# Patient Record
Sex: Male | Born: 1950 | Race: Black or African American | Hispanic: No | Marital: Married | Smoking: Former smoker
Health system: Southern US, Community
[De-identification: ages and names within clinical notes are randomized; demographics above are authoritative.]

## PROBLEM LIST (undated history)

## (undated) DIAGNOSIS — E785 Hyperlipidemia, unspecified: Secondary | ICD-10-CM

## (undated) DIAGNOSIS — I2510 Atherosclerotic heart disease of native coronary artery without angina pectoris: Secondary | ICD-10-CM

## (undated) DIAGNOSIS — Z9289 Personal history of other medical treatment: Secondary | ICD-10-CM

## (undated) DIAGNOSIS — F0150 Vascular dementia without behavioral disturbance: Secondary | ICD-10-CM

## (undated) DIAGNOSIS — I739 Peripheral vascular disease, unspecified: Secondary | ICD-10-CM

## (undated) DIAGNOSIS — M869 Osteomyelitis, unspecified: Secondary | ICD-10-CM

## (undated) DIAGNOSIS — I10 Essential (primary) hypertension: Secondary | ICD-10-CM

## (undated) DIAGNOSIS — I639 Cerebral infarction, unspecified: Secondary | ICD-10-CM

## (undated) DIAGNOSIS — N186 End stage renal disease: Secondary | ICD-10-CM

## (undated) DIAGNOSIS — I509 Heart failure, unspecified: Secondary | ICD-10-CM

## (undated) DIAGNOSIS — E119 Type 2 diabetes mellitus without complications: Secondary | ICD-10-CM

## (undated) DIAGNOSIS — N289 Disorder of kidney and ureter, unspecified: Secondary | ICD-10-CM

## (undated) HISTORY — PX: NEPHRECTOMY TRANSPLANTED ORGAN: SUR880

## (undated) HISTORY — DX: Peripheral vascular disease, unspecified: I73.9

## (undated) HISTORY — DX: Atherosclerotic heart disease of native coronary artery without angina pectoris: I25.10

## (undated) HISTORY — PX: BACK SURGERY: SHX140

## (undated) HISTORY — DX: End stage renal disease: N18.6

## (undated) HISTORY — DX: Type 2 diabetes mellitus without complications: E11.9

## (undated) HISTORY — PX: LEG AMPUTATION BELOW KNEE: SHX694

## (undated) HISTORY — DX: Hyperlipidemia, unspecified: E78.5

## (undated) HISTORY — PX: HAND SURGERY: SHX662

## (undated) HISTORY — DX: Vascular dementia, unspecified severity, without behavioral disturbance, psychotic disturbance, mood disturbance, and anxiety: F01.50

## (undated) HISTORY — PX: KIDNEY TRANSPLANT: SHX239

## (undated) HISTORY — DX: Osteomyelitis, unspecified: M86.9

## (undated) HISTORY — PX: OTHER SURGICAL HISTORY: SHX169

## (undated) HISTORY — DX: Heart failure, unspecified: I50.9

---

## 2013-08-08 DIAGNOSIS — N289 Disorder of kidney and ureter, unspecified: Secondary | ICD-10-CM

## 2013-08-08 DIAGNOSIS — Z940 Kidney transplant status: Secondary | ICD-10-CM

## 2013-08-08 HISTORY — DX: Disorder of kidney and ureter, unspecified: N28.9

## 2013-08-08 HISTORY — DX: Kidney transplant status: Z94.0

## 2014-06-06 DIAGNOSIS — D849 Immunodeficiency, unspecified: Secondary | ICD-10-CM | POA: Insufficient documentation

## 2014-06-06 DIAGNOSIS — D899 Disorder involving the immune mechanism, unspecified: Secondary | ICD-10-CM

## 2014-06-09 DIAGNOSIS — Z792 Long term (current) use of antibiotics: Secondary | ICD-10-CM | POA: Insufficient documentation

## 2014-06-10 DIAGNOSIS — T83122A Displacement of urinary stent, initial encounter: Secondary | ICD-10-CM | POA: Insufficient documentation

## 2014-06-16 DIAGNOSIS — Z9189 Other specified personal risk factors, not elsewhere classified: Secondary | ICD-10-CM | POA: Insufficient documentation

## 2015-06-16 DIAGNOSIS — B349 Viral infection, unspecified: Secondary | ICD-10-CM

## 2015-06-16 DIAGNOSIS — B348 Other viral infections of unspecified site: Secondary | ICD-10-CM | POA: Insufficient documentation

## 2016-08-15 ENCOUNTER — Ambulatory Visit (INDEPENDENT_AMBULATORY_CARE_PROVIDER_SITE_OTHER): Payer: Medicare Other | Admitting: Podiatry

## 2016-08-15 ENCOUNTER — Encounter: Payer: Self-pay | Admitting: Podiatry

## 2016-08-15 ENCOUNTER — Ambulatory Visit (INDEPENDENT_AMBULATORY_CARE_PROVIDER_SITE_OTHER): Payer: Medicare Other

## 2016-08-15 VITALS — BP 158/84 | HR 79 | Resp 16 | Ht 71.0 in | Wt 219.0 lb

## 2016-08-15 DIAGNOSIS — E1140 Type 2 diabetes mellitus with diabetic neuropathy, unspecified: Secondary | ICD-10-CM | POA: Diagnosis not present

## 2016-08-15 DIAGNOSIS — E1149 Type 2 diabetes mellitus with other diabetic neurological complication: Secondary | ICD-10-CM

## 2016-08-15 DIAGNOSIS — M79672 Pain in left foot: Secondary | ICD-10-CM

## 2016-08-15 DIAGNOSIS — E1151 Type 2 diabetes mellitus with diabetic peripheral angiopathy without gangrene: Secondary | ICD-10-CM | POA: Diagnosis not present

## 2016-08-15 DIAGNOSIS — L97401 Non-pressure chronic ulcer of unspecified heel and midfoot limited to breakdown of skin: Secondary | ICD-10-CM | POA: Diagnosis not present

## 2016-08-15 NOTE — Progress Notes (Signed)
   Subjective:    Patient ID: Jonathan Akram Sr., male    DOB: [birth year], 66 y.o.   MRN: [id number]  HPI Chief Complaint  Patient presents with  . Wound Check    Left foot; plantar forefoot-below great toe; pt stated, "Saw some blood come out of ulcer yesterday"; x1+ yrs; pt Diabetic Type 2; Sugar=92 this am; A1C=Did not know      Review of Systems  Constitutional: Positive for fatigue and unexpected weight change.  Cardiovascular: Positive for leg swelling.  Psychiatric/Behavioral: The patient is nervous/anxious.   All other systems reviewed and are negative.      Objective:   Physical Exam        Assessment & Plan:

## 2016-08-15 NOTE — Progress Notes (Signed)
Subjective:     Patient ID: Jonathan Hank Sr., male   DOB: [birth year], 66 y.o.   MRN: [id number]  HPI patient presents with son stating that he's had problems with underneath his left first metatarsal for around a year and a half and he's not had out right infection but it's been broken down and it time drains. Has an approximate 30 year history of diabetes and is had kidney transplant   Review of Systems  All other systems reviewed and are negative.      Objective:   Physical Exam  Constitutional: He is oriented to person, place, and time.  Cardiovascular: Intact distal pulses.   Musculoskeletal: Normal range of motion.  Neurological: He is oriented to person, place, and time.  Skin: Skin is warm and dry.  Nursing note and vitals reviewed.  neurovascular status diminished both sharp [employer] vibratory and DP PT pulses with patient's son stating that he gets his circulation checked. He is found under the left first metatarsal to have a keratotic lesion that's chronic in its nature with some breakdown of tissue localized in nature with no proximal edema erythema or drainage noted at this time. Patient's found to have good digital perfusion and is well oriented 3     Assessment:     Lesion that is secondary to the probable position of the first metatarsal with chronic low grade irritation of skin left and superficial slough of tissue    Plan:     H&P and x-rays of situation discussed. At this point using sharp sterile limitation debridement accomplished I applied padding and instructed on Iodosorb usage at this time. Patient was given strict instructions of any further redness drainage or other issues should occur to call immediately and that daily inspections need to be done. I then want to make this patient diabetic shoes and we will get approval due to the chronic nature of his condition  X-ray left negative for lysis or other bone pathology

## 2016-09-01 DIAGNOSIS — E785 Hyperlipidemia, unspecified: Secondary | ICD-10-CM | POA: Insufficient documentation

## 2016-09-01 DIAGNOSIS — Z7901 Long term (current) use of anticoagulants: Secondary | ICD-10-CM | POA: Insufficient documentation

## 2016-09-01 DIAGNOSIS — E1139 Type 2 diabetes mellitus with other diabetic ophthalmic complication: Secondary | ICD-10-CM | POA: Insufficient documentation

## 2016-09-11 DIAGNOSIS — Z48298 Encounter for aftercare following other organ transplant: Secondary | ICD-10-CM | POA: Insufficient documentation

## 2016-09-12 DIAGNOSIS — I240 Acute coronary thrombosis not resulting in myocardial infarction: Secondary | ICD-10-CM | POA: Insufficient documentation

## 2016-09-12 DIAGNOSIS — Z8679 Personal history of other diseases of the circulatory system: Secondary | ICD-10-CM | POA: Insufficient documentation

## 2016-09-12 DIAGNOSIS — I513 Intracardiac thrombosis, not elsewhere classified: Secondary | ICD-10-CM | POA: Insufficient documentation

## 2016-09-30 ENCOUNTER — Ambulatory Visit: Payer: Medicare Other | Admitting: Podiatry

## 2017-01-12 ENCOUNTER — Encounter: Payer: Self-pay | Admitting: Podiatry

## 2017-01-12 ENCOUNTER — Ambulatory Visit (INDEPENDENT_AMBULATORY_CARE_PROVIDER_SITE_OTHER): Payer: Medicare Other | Admitting: Podiatry

## 2017-01-12 DIAGNOSIS — L97401 Non-pressure chronic ulcer of unspecified heel and midfoot limited to breakdown of skin: Secondary | ICD-10-CM

## 2017-01-12 DIAGNOSIS — M79604 Pain in right leg: Secondary | ICD-10-CM

## 2017-01-12 DIAGNOSIS — M79676 Pain in unspecified toe(s): Secondary | ICD-10-CM | POA: Diagnosis not present

## 2017-01-12 DIAGNOSIS — B351 Tinea unguium: Secondary | ICD-10-CM | POA: Diagnosis not present

## 2017-01-12 DIAGNOSIS — E1140 Type 2 diabetes mellitus with diabetic neuropathy, unspecified: Secondary | ICD-10-CM | POA: Diagnosis not present

## 2017-01-12 DIAGNOSIS — M79605 Pain in left leg: Secondary | ICD-10-CM

## 2017-01-12 DIAGNOSIS — E1149 Type 2 diabetes mellitus with other diabetic neurological complication: Secondary | ICD-10-CM

## 2017-01-12 NOTE — Progress Notes (Signed)
Subjective:    Patient ID: Jonathan Alexandria Sr., male   DOB: 66 y.o.   MRN: [id number]   HPI patient states that he still has this breakdown of tissue plantar aspect left foot and nail disease 1-5 both feet that he cannot take care of. 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 Discussed long-term diabetic shoes and we will get permission and approval from family physician for these to be made for this particular patient

## 2017-02-27 ENCOUNTER — Telehealth: Payer: Self-pay | Admitting: Podiatry

## 2017-02-27 NOTE — Telephone Encounter (Signed)
Left message for pt to call to schedule an appt with Liliane Channel to PUDS

## 2017-03-10 DIAGNOSIS — F0150 Vascular dementia without behavioral disturbance: Secondary | ICD-10-CM | POA: Insufficient documentation

## 2017-03-10 NOTE — Telephone Encounter (Signed)
lvm for pt to call to schedule an appt to pick up diabetic shoes

## 2017-03-16 ENCOUNTER — Ambulatory Visit (INDEPENDENT_AMBULATORY_CARE_PROVIDER_SITE_OTHER): Payer: Medicare Other | Admitting: Orthotics

## 2017-03-16 DIAGNOSIS — E1151 Type 2 diabetes mellitus with diabetic peripheral angiopathy without gangrene: Secondary | ICD-10-CM

## 2017-03-16 DIAGNOSIS — E1149 Type 2 diabetes mellitus with other diabetic neurological complication: Secondary | ICD-10-CM | POA: Diagnosis not present

## 2017-03-16 DIAGNOSIS — L97401 Non-pressure chronic ulcer of unspecified heel and midfoot limited to breakdown of skin: Secondary | ICD-10-CM

## 2017-03-16 DIAGNOSIS — E1140 Type 2 diabetes mellitus with diabetic neuropathy, unspecified: Secondary | ICD-10-CM

## 2017-03-16 NOTE — Progress Notes (Signed)

## 2017-04-14 ENCOUNTER — Ambulatory Visit: Payer: Medicare Other | Admitting: Podiatry

## 2017-06-26 ENCOUNTER — Encounter: Payer: Self-pay | Admitting: Podiatry

## 2017-06-26 ENCOUNTER — Ambulatory Visit (INDEPENDENT_AMBULATORY_CARE_PROVIDER_SITE_OTHER): Payer: Medicare Other | Admitting: Podiatry

## 2017-06-26 DIAGNOSIS — L84 Corns and callosities: Secondary | ICD-10-CM | POA: Diagnosis not present

## 2017-06-26 DIAGNOSIS — M79674 Pain in right toe(s): Secondary | ICD-10-CM | POA: Diagnosis not present

## 2017-06-26 DIAGNOSIS — E1151 Type 2 diabetes mellitus with diabetic peripheral angiopathy without gangrene: Secondary | ICD-10-CM

## 2017-06-26 DIAGNOSIS — B351 Tinea unguium: Secondary | ICD-10-CM

## 2017-06-26 DIAGNOSIS — M79675 Pain in left toe(s): Secondary | ICD-10-CM | POA: Diagnosis not present

## 2017-06-26 DIAGNOSIS — D689 Coagulation defect, unspecified: Secondary | ICD-10-CM

## 2017-06-26 NOTE — Progress Notes (Signed)
Subjective:    Patient ID: Jonathan Alexandria Sr., male   DOB: 66 y.o.   MRN: [id number]   HPI patient presents with chronic nail disease 1-5 of both feet with thick yellow brittle nailbeds and keratotic lesion sub-first metatarsal left. Patient is a diabetic who is under reasonably good control but has neurological issues and does take blood      ROS      Objective:  Physical Exam neurovascular status unchanged with thick yellow brittle nailbeds 1-5 both feet with lesion sub-first metatarsal left painful when pressed     Assessment:    At risk diabetic who is on blood thinner with mycotic nail infection lesion formation     Plan:    Debridement and nailbeds and lesion with no iatrogenic bleeding and reappoint for same type of procedure

## 2017-09-25 ENCOUNTER — Ambulatory Visit (INDEPENDENT_AMBULATORY_CARE_PROVIDER_SITE_OTHER): Payer: Medicare Other | Admitting: Podiatry

## 2017-09-25 DIAGNOSIS — B351 Tinea unguium: Secondary | ICD-10-CM

## 2017-09-25 DIAGNOSIS — E1149 Type 2 diabetes mellitus with other diabetic neurological complication: Secondary | ICD-10-CM

## 2017-09-25 DIAGNOSIS — I219 Acute myocardial infarction, unspecified: Secondary | ICD-10-CM | POA: Insufficient documentation

## 2017-09-25 DIAGNOSIS — M79605 Pain in left leg: Secondary | ICD-10-CM

## 2017-09-25 DIAGNOSIS — E1140 Type 2 diabetes mellitus with diabetic neuropathy, unspecified: Secondary | ICD-10-CM | POA: Diagnosis not present

## 2017-09-25 DIAGNOSIS — M79675 Pain in left toe(s): Secondary | ICD-10-CM

## 2017-09-25 DIAGNOSIS — L84 Corns and callosities: Secondary | ICD-10-CM | POA: Diagnosis not present

## 2017-09-25 DIAGNOSIS — B182 Chronic viral hepatitis C: Secondary | ICD-10-CM | POA: Insufficient documentation

## 2017-09-25 DIAGNOSIS — Z9289 Personal history of other medical treatment: Secondary | ICD-10-CM | POA: Insufficient documentation

## 2017-09-25 DIAGNOSIS — D689 Coagulation defect, unspecified: Secondary | ICD-10-CM

## 2017-09-25 DIAGNOSIS — Z940 Kidney transplant status: Secondary | ICD-10-CM | POA: Insufficient documentation

## 2017-09-25 DIAGNOSIS — M79604 Pain in right leg: Secondary | ICD-10-CM

## 2017-09-25 DIAGNOSIS — E119 Type 2 diabetes mellitus without complications: Secondary | ICD-10-CM | POA: Insufficient documentation

## 2017-09-25 DIAGNOSIS — M79674 Pain in right toe(s): Secondary | ICD-10-CM

## 2017-09-25 DIAGNOSIS — E1151 Type 2 diabetes mellitus with diabetic peripheral angiopathy without gangrene: Secondary | ICD-10-CM

## 2017-09-25 NOTE — Progress Notes (Signed)
Subjective:   Patient ID: Jonathan Alexandria Sr., male   DOB: 67 y.o.   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  Discussed long-term diabetic shoes which she has worn in the past to keep the pressure off the first metatarsal and prevent breakdown of tissue.  We did fill out forms and sent for authorization

## 2017-10-23 ENCOUNTER — Telehealth: Payer: Self-pay | Admitting: Podiatry

## 2017-10-23 NOTE — Telephone Encounter (Signed)
lvm for pt to call to schedule an appt to be measured for diabetic shoes.

## 2017-11-13 ENCOUNTER — Telehealth: Payer: Self-pay | Admitting: Podiatry

## 2017-11-13 NOTE — Telephone Encounter (Signed)
Left message for pt to call to schedule an appt to be measured for diabetic shoes.

## 2017-11-28 DIAGNOSIS — I252 Old myocardial infarction: Secondary | ICD-10-CM | POA: Insufficient documentation

## 2017-11-28 DIAGNOSIS — I10 Essential (primary) hypertension: Secondary | ICD-10-CM | POA: Insufficient documentation

## 2017-11-28 DIAGNOSIS — T8611 Kidney transplant rejection: Secondary | ICD-10-CM | POA: Insufficient documentation

## 2017-11-29 DIAGNOSIS — I639 Cerebral infarction, unspecified: Secondary | ICD-10-CM | POA: Insufficient documentation

## 2017-12-02 DIAGNOSIS — Z7901 Long term (current) use of anticoagulants: Secondary | ICD-10-CM | POA: Insufficient documentation

## 2017-12-14 DIAGNOSIS — D62 Acute posthemorrhagic anemia: Secondary | ICD-10-CM | POA: Insufficient documentation

## 2017-12-21 DIAGNOSIS — D509 Iron deficiency anemia, unspecified: Secondary | ICD-10-CM | POA: Insufficient documentation

## 2017-12-25 ENCOUNTER — Other Ambulatory Visit: Payer: Medicare Other

## 2018-01-10 DIAGNOSIS — Z992 Dependence on renal dialysis: Secondary | ICD-10-CM | POA: Insufficient documentation

## 2018-01-10 DIAGNOSIS — R269 Unspecified abnormalities of gait and mobility: Secondary | ICD-10-CM | POA: Insufficient documentation

## 2018-01-16 DIAGNOSIS — N2581 Secondary hyperparathyroidism of renal origin: Secondary | ICD-10-CM | POA: Insufficient documentation

## 2018-01-16 DIAGNOSIS — L299 Pruritus, unspecified: Secondary | ICD-10-CM | POA: Insufficient documentation

## 2018-01-16 DIAGNOSIS — R0602 Shortness of breath: Secondary | ICD-10-CM | POA: Insufficient documentation

## 2018-01-16 DIAGNOSIS — R52 Pain, unspecified: Secondary | ICD-10-CM | POA: Insufficient documentation

## 2018-01-16 DIAGNOSIS — T829XXA Unspecified complication of cardiac and vascular prosthetic device, implant and graft, initial encounter: Secondary | ICD-10-CM | POA: Insufficient documentation

## 2018-01-16 DIAGNOSIS — D688 Other specified coagulation defects: Secondary | ICD-10-CM | POA: Insufficient documentation

## 2018-01-16 DIAGNOSIS — E1151 Type 2 diabetes mellitus with diabetic peripheral angiopathy without gangrene: Secondary | ICD-10-CM | POA: Insufficient documentation

## 2018-01-24 DIAGNOSIS — M1000 Idiopathic gout, unspecified site: Secondary | ICD-10-CM | POA: Insufficient documentation

## 2018-02-13 ENCOUNTER — Ambulatory Visit: Payer: Medicare Other | Attending: Registered Nurse | Admitting: Physical Therapy

## 2018-02-13 ENCOUNTER — Encounter: Payer: Self-pay | Admitting: Physical Therapy

## 2018-02-13 ENCOUNTER — Ambulatory Visit: Payer: Medicare Other | Admitting: Occupational Therapy

## 2018-02-13 DIAGNOSIS — R278 Other lack of coordination: Secondary | ICD-10-CM

## 2018-02-13 DIAGNOSIS — I69315 Cognitive social or emotional deficit following cerebral infarction: Secondary | ICD-10-CM

## 2018-02-13 DIAGNOSIS — M6281 Muscle weakness (generalized): Secondary | ICD-10-CM | POA: Diagnosis present

## 2018-02-13 DIAGNOSIS — R2689 Other abnormalities of gait and mobility: Secondary | ICD-10-CM

## 2018-02-13 DIAGNOSIS — R2681 Unsteadiness on feet: Secondary | ICD-10-CM

## 2018-02-13 NOTE — Therapy (Signed)
[facility] [address] [place], [place], [address] Phone: [phone]   Fax:  [phone]  Occupational Therapy Evaluation  Patient Details  Name: Jonathan Viglione Sr. MRN: [id number] Date of Birth: 1950-09-29 Referring Provider: Reather Laurence. Blanch Media   Encounter Date: 02/13/2018  OT End of Session - 02/13/18 1234    Visit Number  1    Number of Visits  25    Date for OT Re-Evaluation  05/14/18    Authorization Type  [employer] -# [id number]   Authorization Time Period  await auth    OT Start Time  1020    OT Stop Time  1100    OT Time Calculation (min)  40 min    Behavior During Therapy  [facility] for tasks assessed/performed       No past medical history on file.  Past Surgical History:  Procedure Laterality Date  . BACK SURGERY     Has had 2 back surgeries  . Depression    . HAND SURGERY Right   . KIDNEY TRANSPLANT     November 2015  . Memory loss      There were no vitals filed for this visit.  Subjective Assessment - 02/13/18 1543    Subjective   Pt reports difficulty using L hand    Pertinent History  2015-kidney transplant; 2017-ministrokes; port of R side of chest; fistula on L upper arm (No BP in LUE); ESRD, CVA, Hep C per hx    Patient Stated Goals  regain use of LUE    Currently in Pain?  No/denies        [facility] OT Assessment - 02/13/18 1028      Assessment   Medical Diagnosis  ESRD, cerebral infarction, DM, gait and imbalance    Referring Provider  [place] D. [place]    Onset Date/Surgical Date  01/10/18      Precautions   Precautions  Fall    Precaution Comments  Port R side chest, fistula L UE (NO BP); on dialysis M, W, F no showering allowed currently    Required Braces or Orthoses  -- dialysis M, W, F      Balance Screen   Has the patient fallen in the past 6 months  Yes    How many times?  1    Has the patient had a decrease in activity level because of a fear of falling?   Yes    Is  the patient reluctant to leave their home because of a fear of falling?   No      Home  Environment   Family/patient expects to be discharged to:  Private residence    Home Access  -- 1 step    [place]  Two level    Bathroom Shower/Tub  Tub/Shower unit    Lives With  Spouse      Prior Function   Level of [place]  Retired    Leisure  Enjoyed cutting grass (hasn't done in a while); senior center exercise class, Aquatic center classes      ADL   Eating/Feeding  Independent    Grooming  Minimal assistance assist for shaving    Upper Body Bathing  Maximal assistance    Lower Body Bathing  Maximal assistance    Upper Body Dressing  Minimal assistance difficulty with buttons    Lower Body Dressing  Moderate assistance    Toilet Transfer  Supervision/safety  Tub/Shower Transfer  -- n/a currently sponge bathing      IADL   Shopping  Completely unable to shop    Light Housekeeping  Does not participate in any housekeeping tasks    Meal Prep  Needs to have meals prepared and served    Medication Management  Is not capable of dispensing or managing own medication    Financial Management  Dependent      Mobility   Mobility Status  -- supervision-modified independent with rollator      Written Expression   Dominant Hand  Right      Vision - History   Baseline Vision  Wears glasses all the time      Vision Assessment   Vision Assessment  Vision not tested      Cognition   Overall Cognitive Status  Impaired/Different from baseline    Area of Impairment  Memory;Orientation;Safety/judgement confusion    Memory  Decreased short-term memory    Safety/Judgement  Decreased awareness of safety    Cognition Comments  Cognition to be further assessed in a functional context, pt was not oriented to day, date or year      Observation/Other Assessments   Focus on Therapeutic Outcomes (FOTO)   NA      Sensation   Light Touch  Impaired by gross assessment  impaired for LUE      Coordination   Gross Motor Movements are Fluid and Coordinated  No    Fine Motor Movements are Fluid and Coordinated  No    9 Hole Peg Test  Right;Left    Right 9 Hole Peg Test  45.59 secs    Left 9 Hole Peg Test  1 min 51 secs      ROM / Strength   AROM / PROM / Strength  AROM      AROM   Overall AROM   Deficits Pt does not consistently use LUE as a non dominant assist    Overall AROM Comments  RUE grossly WFLS, LUE shoulder flexion 125, shoulder abduction 100, supination 75%, pronation WFLS, wrist flexion 50*, wrist extension 45*,       Strength   Overall Strength  Deficits      Hand Function   Right Hand Grip (lbs)  51    Left Hand Grip (lbs)  5                        OT Short Term Goals - 02/13/18 1249      OT SHORT TERM GOAL #1   Title  Pt/ caregiver will be I with inital HEP.  due 03/15/18    Time  4    Period  Weeks    Status  New    Target Date  03/15/18      OT SHORT TERM GOAL #2   Title  Pt will perform UB dressing with setup/ supervision, LB dressing with min A.    Time  4    Period  Weeks    Status  New      OT SHORT TERM GOAL #3   Title  Pt will use LUE as a non-dominant assist at least 25 % of the time for ADLs/ IADLS.    Baseline  currently pt does not use LUE functionally    Time  4    Period  Weeks    Status  New      OT SHORT TERM GOAL #4   Title  Pt will demonstrate improved LUE fine motor coordination as evidenced by performing 9 hole peg test in 90 secs or less.    Baseline  RUE 49.59 secs, LUE  1 min 51 secs    Time  4    Period  Weeks    Status  New      OT SHORT TERM GOAL #5   Title  Pt / family will verbalize understanding of LUE postioning to minimize pain, edema and risk for injury.    Baseline  moderate edema in left hand    Time  4    Period  Weeks    Status  New      Additional Short Term Goals   Additional Short Term Goals  Yes      OT SHORT TERM GOAL #6   Title  Pt/ caregiver will  verbalize understanding of compensatory strategies for short term memory and cognitive deficits.    Time  4    Period  Weeks    Status  New      OT SHORT TERM GOAL #7   Title  Pt will perform a simple task requiring organization and problem solving with 75% accuracy.    Time  4    Period  Weeks    Status  New      OT SHORT TERM GOAL #8   Title  Further assess cognition prn and add additional goals as needed    Time  4    Period  Weeks    Status  New        OT Long Term Goals - 02/13/18 1254      OT LONG TERM GOAL #1   Title  Pt/ caregiver will be I with updated HEP.    Time  12    Period  Weeks    Status  New    Target Date  05/14/18      OT LONG TERM GOAL #2   Title  Pt will perform all basic ADLS modified independently.    Time  12    Period  Weeks    Status  New      OT LONG TERM GOAL #3   Title  Pt will perform simple snack or beverage prep with supervision .    Time  12    Period  Weeks    Status  New      OT LONG TERM GOAL #4   Title  Pt will use LUE as a non-dominant assist at least 50% of the time for ADLs/ IADLs.    Time  12    Period  Weeks    Status  New      OT LONG TERM GOAL #5   Title  Pt will demonstrate improved LUE fine motor coordination for ADLS as evidenced by performing 9-hole peg test in 60 secs or less.    Time  12    Period  Weeks    Status  New      OT LONG TERM GOAL #6   Title  Pt will perfrom simple home management tasks with supervision demonstrating good safety awareness.    Time  12    Period  Weeks    Status  New            Plan - 02/13/18 1235    Clinical Impression Statement  Pt is a 67 year old male with history of ESRD, s/p failed kidney transplant, hospitalization April 2019 due to toxicity  with possible CVA while in hospital at [facility]. Pt was hospitalized for 3 weeks, then went to [facility], and has been at home since late May 2019. Pt presents to OP OT with the following deficits: decreased strength,  decreased coordination,  decreased LUE functional use, decreased balance, decreased safety and independence with gait, cognitive deficits which impede perfromance of ADLS/IADLS. Pt can benefit from skilled occupational therapy to maximize pt's safety and independence with daily activities.     Occupational Profile and client history currently impacting functional performance   Prior to pt's CVA he was I with ADLS, driving and enjoyed exercise at [employer]. PMH ESRD, dialysis, hx of CVA, 2015-kidney transplant; 2017-ministrokes; port of R side of chest; fistula on L upper arm (No BP in LUE);     Occupational performance deficits (Please refer to evaluation for details):  IADL's;ADL's;Play;Leisure;Social Participation    Rehab Potential  Good    Current Impairments/barriers affecting progress:  cognitive deficits, dialysis, complex medical hx    OT Frequency  2x / week plus eval    OT Duration  12 weeks    OT Treatment/Interventions  Self-care/ADL training;Therapeutic exercise;Patient/family education;Splinting;Neuromuscular education;Balance training;Therapeutic activities;Functional Mobility Training;Energy conservation;Fluidtherapy;Ultrasound;Cryotherapy;DME and/or AE instruction;Manual Therapy;Passive range of motion;Cognitive remediation/compensation;Visual/perceptual remediation/compensation;Moist Heat;Paraffin;Aquatic Therapy    Plan  initate HEP for LUE functional use, coordination, putty    Clinical Decision Making  Several treatment options, min-mod task modification necessary    Consulted and Agree with Plan of Care  Family member/caregiver;Patient    Family Member Consulted  wife-[name]       Patient will benefit from skilled therapeutic intervention in order to improve the following deficits and impairments:  Abnormal gait, Decreased cognition, Decreased knowledge of use of DME, Increased edema, Pain, Impaired flexibility, Impaired sensation, Decreased coordination, Decreased  mobility, Decreased activity tolerance, Decreased endurance, Decreased range of motion, Decreased strength, Impaired UE functional use, Impaired perceived functional ability, Difficulty walking, Decreased safety awareness, Decreased knowledge of precautions, Decreased balance  Visit Diagnosis: Other lack of coordination - Plan: Ot plan of care cert/re-cert  Muscle weakness (generalized) - Plan: Ot plan of care cert/re-cert  Other abnormalities of gait and mobility - Plan: Ot plan of care cert/re-cert  Unsteadiness on feet - Plan: Ot plan of care cert/re-cert  Cognitive social or emotional deficit following cerebral infarction - Plan: Ot plan of care cert/re-cert    Problem List Patient Active Problem List   Diagnosis Date Noted  . Chronic hepatitis C without hepatic coma ([place]) 09/25/2017  . Diabetes mellitus type 2, uncomplicated ([place]) 41/96/2229  . Myocardial infarction ([place]) 09/25/2017  . Renal transplant recipient 09/25/2017  . Transfusion history 09/25/2017  . History of coronary artery disease 09/12/2016  . 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 MI 09/12/2016  . Aftercare following organ transplant 09/11/2016  . 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 At risk for infection transmitted from donor 06/16/2014  . Ureteral stent displacement ([place]) 06/10/2014  . Prophylactic antibiotic 06/09/2014  . Immunosuppression ([place]) 06/06/2014    RINE,KATHRYN 02/13/2018, 3:54 PM Theone Murdoch, OTR/L [phone] Phone: [phone] 3:55 PM 02/13/18 [facility] [address] [place], [place], [address] Phone: [phone]   Fax:  [phone]  Name: Breylin Dom Sr. MRN: [id number] Date of Birth: 24-Oct-1950

## 2018-02-13 NOTE — Therapy (Signed)
[facility] [address] [place], [place], [address] Phone: [phone]   Fax:  [phone]  Physical Therapy Evaluation  Patient Details  Name: Jonathan Schellhorn Sr. MRN: [id number] Date of Birth: [birth year] Referring Provider: Reather Laurence. Blanch Media   Encounter Date: 02/13/2018  PT End of Session - 02/13/18 1048    Visit Number  1    Number of Visits  17    Date for PT Re-Evaluation  05/14/18    Authorization Type  [employer] Medicare Replacement-Prior Auth required; 10th visit progress note needed; [id number]    PT Start Time  0934    PT Stop Time  1018    PT Time Calculation (min)  44 min    Equipment Utilized During Treatment  Gait belt    Activity Tolerance  Patient tolerated treatment well    Behavior During Therapy  [facility] for tasks assessed/performed       History reviewed. No pertinent past medical history.  Past Surgical History:  Procedure Laterality Date  . BACK SURGERY     Has had 2 back surgeries  . Depression    . HAND SURGERY Right   . KIDNEY TRANSPLANT     November 2015  . Memory loss      There were no vitals filed for this visit.   Subjective Assessment - 02/13/18 0939    Subjective  Pt reports having "a couple of strokes".  Can't go back to work until I have therapy.  (Wife reprorts he has not worked in some time).  Wife reports hospitalization in April 2019 at [facility], with kidney transplant failure and back on dialysis (M, W, F).  Had additional mini-strokes while in hospital, with changes in strength, balance and L sided weakness.  Prior to hospitalization, he was participating in senior exercise.  Was using a cane prior to hospitalization and now using rollator.  One fall at home 2 weeks ago, trying to pull on refrigerator door.    Patient is accompained by:  Family member wife, [name]    Pertinent History  2015-kidney transplant; 2017-ministrokes; port of R side of chest; fistula on L upper arm  (No BP in LUE); Independent prior to hospitalization, 3 weeks in hospital>[facility]>home    Patient Stated Goals  Pt's goal is to "learn how to walk again."  Wife's goal is to get stronger and get balance better.    Currently in Pain?  No/denies         [facility] PT Assessment - 02/13/18 0947      Assessment   Medical Diagnosis  ESRD, cerebral infarction, DM, gait and imbalance    Referring Provider  [place] D. Blanch Media, NP    Onset Date/Surgical Date  01/10/18 MD visit    Hand Dominance  Right      Precautions   Precautions  Fall    Precaution Comments  Port R side chest, fistula L UE (NO BP); on dialysis M, W, F      Balance Screen   Has the patient fallen in the past 6 months  Yes    How many times?  1    Has the patient had a decrease in activity level because of a fear of falling?   Yes    Is the patient reluctant to leave their home because of a fear of falling?   No      Home Film/video editor residence    Living Arrangements  Spouse/significant  other    Available Help at Discharge  Family Wife works nights; son/daughter work days; grandson there    Type of [place]  -- one step up into the house    Home Layout  Two level;Bed/bath upstairs    Alternate Level Stairs-Rails  Right    [place] - single point;Walker - 4 wheels      Prior Function   Level of Independence  Independent    Vocation  Retired    Leisure  Enjoyed cutting grass (hasn't done in a while); senior center exercise class, Aquatic center classes      Cognition   Overall Cognitive Status  Impaired/Different from baseline      Observation/Other Assessments   Focus on Therapeutic Outcomes (FOTO)   NA      ROM / Strength   AROM / PROM / Strength  Strength      Strength   Overall Strength  Deficits    Overall Strength Comments  Grossly tested 4/5 throughout RLE, 3+5/ LLE      Transfers   Transfers  Sit to Stand;Stand to Sit    Sit to  Stand  5: Supervision;With upper extremity assist;From chair/3-in-1    Stand to Sit  5: Supervision;With upper extremity assist;To chair/3-in-1    Stand to Sit Details (indicate cue type and reason)  Verbal cues for technique Regarding fully turning rollator and locking brakes      Ambulation/Gait   Ambulation/Gait  Yes    Ambulation/Gait Assistance  5: Supervision    Ambulation Distance (Feet)  200 Feet    Assistive device  Rollator    Gait Pattern  Step-through pattern;Decreased hip/knee flexion - left;Decreased step length - left;Decreased stance time - left L foot slap    Ambulation Surface  Level;Indoor    Gait velocity  18.25 sec = 1.8 ft/sec      Standardized Balance Assessment   Standardized Balance Assessment  Timed Up and Go Test;Berg Balance Test      Berg Balance Test   Sit to Stand  Able to stand  independently using hands    Standing Unsupported  Able to stand 2 minutes with supervision    Sitting with Back Unsupported but Feet Supported on Floor or Stool  Able to sit safely and securely 2 minutes    Stand to Sit  Controls descent by using hands    Transfers  Able to transfer with verbal cueing and /or supervision    Standing Unsupported with Eyes Closed  Able to stand 10 seconds with supervision    Standing Ubsupported with Feet Together  Needs help to attain position but able to stand for 30 seconds with feet together    From Standing, Reach Forward with Outstretched Arm  Can reach forward >5 cm safely (2")    From Standing Position, Pick up Object from Floor  Unable to try/needs assist to keep balance    From Standing Position, Turn to Look Behind Over each Shoulder  Needs supervision when turning    Turn 360 Degrees  Needs close supervision or verbal cueing    Standing Unsupported, Alternately Place Feet on Step/Stool  Needs assistance to keep from falling or unable to try    Standing Unsupported, One Foot in [place] to take small step independently and hold 30  seconds    Standing on One Leg  Unable to try or needs assist to prevent fall  Total Score  25    Berg comment:  Scores <45/56 indicate increased fall risk      Timed Up and Go Test   Normal TUG (seconds)  30.88    TUG Comments  Scores >13.5 sec indicate increased fall risk; >30 seconds indicates difficulty with ADLs in home                Objective measurements completed on examination: See above findings.                PT Short Term Goals - 02/13/18 1055      PT SHORT TERM GOAL #1   Title  Pt will perform HEP with family's supervision for improved balance, strength, and gait.  TARGET (after waiting 1 week for auth):03/16/18    Time  4    Period  Weeks    Status  New    Target Date  03/16/18      PT SHORT TERM GOAL #2   Title  Pt will improve BERG Balance score to at least 32/56 for decreased fall risk.    Time  4    Period  Weeks    Status  New    Target Date  03/16/18      PT SHORT TERM GOAL #3   Title  Pt will improve TUG to less than or equal to 22 seconds for decreased fall risk.    Time  4    Period  Weeks    Status  New    Target Date  03/16/18      PT SHORT TERM GOAL #4   Title  Pt will perform sit<>stand transfers, at least 8 of 10 reps with minimal to no UE support, for improved safety and efficiency with transfers.    Time  4    Period  Weeks    Status  New    Target Date  03/16/18      PT SHORT TERM GOAL #5   Title  Pt/wife will verbalize understanding of fall prevention in home environment.    Time  4    Period  Weeks    Status  New    Target Date  03/16/18        PT Long Term Goals - 02/13/18 1058      PT LONG TERM GOAL #1   Title  Pt will improve gait velocity to at least 2.3 ft/sec for improved gait efficiency and safety.  TARGET 04/13/18    Time  8    Period  Weeks    Status  New    Target Date  04/13/18      PT LONG TERM GOAL #2   Title  Pt will improve Berg Balance score to at least 40/56 for decreased fall risk.     Time  8    Period  Weeks    Status  New    Target Date  04/13/18      PT LONG TERM GOAL #3   Title  Pt will improve TUG score to less than or equal to 15 seconds for decreased fall risk.    Time  8    Period  Weeks    Status  New    Target Date  04/13/18      PT LONG TERM GOAL #4   Title  Pt will ambulate at least 1000 ft, indoor and outdoor surfaces, modified independently, for improved outdoor and community gait.    Time  8  Period  Weeks    Status  New    Target Date  04/13/18      PT LONG TERM GOAL #5   Title  Pt will ambulate household distances, 50-100 ft using cane, with supervision, for improved independence with gait.    Time  8    Period  Weeks    Status  New    Target Date  04/13/18      Additional Long Term Goals   Additional Long Term Goals  Yes      PT LONG TERM GOAL #6   Title  Pt/wife will verbalize plans for continued community fitness upon d/c from PT.    Time  8    Period  Weeks    Status  New    Target Date  04/13/18             Plan - 02/13/18 1049    Clinical Impression Statement  Pt is a 67 year old male with history of ESRD, s/p failed kidney transplant, hospitalization April 2019 due to toxicity with possible CVA while in hospital at [facility].  Pt was hospitalized for 3 weeks, then went to [facility], and has been at home since late May 2019.  Pt presents to OP PT with decreased strength, decreased balance, decreased safety and independence with gait, decreased independence with transfers, decreased safety awareness.  Prior to hospitalization, pt was independent and enjoyed outdoor activities and exercise classes at [facility] [employer].  He would benefit from skilled physical therapy to address the above stated deficits to decrease fall risk and improve functional mobility.    History and Personal Factors relevant to plan of care:  PMH >3 co-morbidities including failed kidney transplant now on dialysis M, W, F, hx of CVA    Clinical  Presentation  Evolving    Clinical Presentation due to:  fall risk per Berg, TUG, gait velocity, 1 fall in past 6 months    Clinical Decision Making  Moderate    Rehab Potential  Good    Clinical Impairments Affecting Rehab Potential  Cognitive communication deficit (wife present at eval, family supportive)    PT Frequency  2x / week    PT Duration  8 weeks plus eval    PT Treatment/Interventions  ADLs/Self Care Home Management;Gait training;Functional mobility training;Therapeutic activities;Therapeutic exercise;Balance training;DME Instruction;Neuromuscular re-education;Patient/family education;Orthotic Fit/Training    PT Next Visit Plan  Transfer safety and technique (with use of rollator); initiate HEP for strength and balance; gait training     Recommended Other Services  Speech therapy evaluation recommended to also address cognitive communication deficit    Consulted and Agree with Plan of Care  Patient;Family member/caregiver       Patient will benefit from skilled therapeutic intervention in order to improve the following deficits and impairments:  Abnormal gait, Decreased balance, Decreased knowledge of use of DME, Decreased mobility, Decreased safety awareness, Difficulty walking, Decreased strength  Visit Diagnosis: Other abnormalities of gait and mobility  Unsteadiness on feet  Muscle weakness (generalized)     Problem List Patient Active Problem List   Diagnosis Date Noted  . Chronic hepatitis C without hepatic coma ([place]) 09/25/2017  . Diabetes mellitus type 2, uncomplicated ([place]) 34/74/2595  . Myocardial infarction ([place]) 09/25/2017  . Renal transplant recipient 09/25/2017  . Transfusion history 09/25/2017  . History of coronary artery disease 09/12/2016  . 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 MI 09/12/2016  . Aftercare following organ transplant 09/11/2016  . Mineral metabolism disorder  09/11/2016  . 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 At risk for infection  transmitted from donor 06/16/2014  . Ureteral stent displacement ([place]) 06/10/2014  . Prophylactic antibiotic 06/09/2014  . Immunosuppression ([place]) 06/06/2014    MARRIOTT,AMY W. 02/13/2018, 11:36 AM Mady Haagensen, PT 02/13/18 11:37 AM Phone: [phone] Fax: [facility] [address] [place], [place], [address] Phone: [phone]   Fax:  [phone]  Name: Jonathan Marczak Sr. MRN: [id number] Date of Birth: 1950/09/05

## 2018-02-17 ENCOUNTER — Encounter (HOSPITAL_COMMUNITY): Payer: Self-pay

## 2018-02-17 ENCOUNTER — Emergency Department (HOSPITAL_COMMUNITY)
Admission: EM | Admit: 2018-02-17 | Discharge: 2018-02-17 | Disposition: A | Discharge status: Home / Self Care | Payer: Medicare Other | Attending: Emergency Medicine | Admitting: Emergency Medicine

## 2018-02-17 ENCOUNTER — Emergency Department (HOSPITAL_COMMUNITY): Payer: Medicare Other

## 2018-02-17 ENCOUNTER — Other Ambulatory Visit: Payer: Self-pay

## 2018-02-17 DIAGNOSIS — Z7982 Long term (current) use of aspirin: Secondary | ICD-10-CM | POA: Insufficient documentation

## 2018-02-17 DIAGNOSIS — I10 Essential (primary) hypertension: Secondary | ICD-10-CM | POA: Diagnosis not present

## 2018-02-17 DIAGNOSIS — Z79899 Other long term (current) drug therapy: Secondary | ICD-10-CM | POA: Diagnosis not present

## 2018-02-17 DIAGNOSIS — M25532 Pain in left wrist: Secondary | ICD-10-CM | POA: Diagnosis not present

## 2018-02-17 DIAGNOSIS — I2510 Atherosclerotic heart disease of native coronary artery without angina pectoris: Secondary | ICD-10-CM | POA: Diagnosis not present

## 2018-02-17 DIAGNOSIS — Z940 Kidney transplant status: Secondary | ICD-10-CM | POA: Diagnosis not present

## 2018-02-17 DIAGNOSIS — M79645 Pain in left finger(s): Secondary | ICD-10-CM

## 2018-02-17 DIAGNOSIS — Z823 Family history of stroke: Secondary | ICD-10-CM | POA: Diagnosis not present

## 2018-02-17 DIAGNOSIS — Z794 Long term (current) use of insulin: Secondary | ICD-10-CM | POA: Diagnosis not present

## 2018-02-17 DIAGNOSIS — Z87891 Personal history of nicotine dependence: Secondary | ICD-10-CM | POA: Insufficient documentation

## 2018-02-17 DIAGNOSIS — E119 Type 2 diabetes mellitus without complications: Secondary | ICD-10-CM | POA: Insufficient documentation

## 2018-02-17 HISTORY — DX: Disorder of kidney and ureter, unspecified: N28.9

## 2018-02-17 HISTORY — DX: Type 2 diabetes mellitus without complications: E11.9

## 2018-02-17 HISTORY — DX: Essential (primary) hypertension: I10

## 2018-02-17 HISTORY — DX: Cerebral infarction, unspecified: I63.9

## 2018-02-17 MED ORDER — NAPROXEN 375 MG PO TABS: 375.0000 mg | ORAL_TABLET | Freq: Two times a day (BID) | ORAL | 0 refills | Status: AC

## 2018-02-17 MED ORDER — PREDNISONE 10 MG PO TABS: 40.0000 mg | ORAL_TABLET | Freq: Every day | ORAL | 0 refills | Status: DC

## 2018-02-17 MED ORDER — PREDNISONE 10 MG PO TABS: 40.0000 mg | ORAL_TABLET | Freq: Every day | ORAL | 0 refills | Status: AC

## 2018-02-17 MED ORDER — NAPROXEN 375 MG PO TABS: 375.0000 mg | ORAL_TABLET | Freq: Two times a day (BID) | ORAL | 0 refills | Status: DC

## 2018-02-17 MED ORDER — OXYCODONE-ACETAMINOPHEN 5-325 MG PO TABS: 1.0000 | ORAL_TABLET | Freq: Three times a day (TID) | ORAL | 0 refills | Status: AC | PRN

## 2018-02-17 MED ORDER — OXYCODONE-ACETAMINOPHEN 5-325 MG PO TABS
1.0000 | ORAL_TABLET | Freq: Once | ORAL | Status: AC
  Administered 2018-02-17: 1 via ORAL
  Filled 2018-02-17: qty 1

## 2018-02-17 NOTE — ED Triage Notes (Signed)
Onset 2 weeks left hand swelling and little finger pain.  Dialysis doctor put pt on Naproxen for several days and swelling decreased.  Pain in little finger has persisted and now discoloration in little finger.  Pt had AV fistula (left upper arm) surgery 12-08-17 at [facility].

## 2018-02-17 NOTE — Discharge Instructions (Addendum)
Please take increased dose of prednisone, 40 mg, for the next 5 days and then you may restart your normal amount of prednisone, 5 mg, after the 5-day course is completed. You may take naproxen as prescribed for your pain, please follow-up with your nephrologist regarding your visit today. You may use Percocet as prescribed and needed for breakthrough pain. You must follow-up with your primary care provider, nephrologist as well as your vascular surgeon regarding your visit today. Please return to the emergency department for any new or worsening symptoms. It is likely that your pain today is caused by your gout, however it is important to follow-up with your vascular surgeon as well as your nephrologist and primary care provider for further evaluation.  Contact a doctor if: You have another gout attack. You still have symptoms of a gout attack after10 days of treatment. You have problems (side effects) because of your medicines. You have chills or a fever. You have burning pain when you pee (urinate). You have pain in your lower back or belly. Get help right away if: You have very bad pain. Your pain cannot be controlled. You cannot pee. You develop a fever You develop numbness, tingling, weakness. You develop a color change to your finger or hand. Contact a health care provider if: Your pain is getting worse. Your pain is not relieved with medicines. You lose function in the area of the pain if the pain is in your arms, legs, or neck.

## 2018-02-17 NOTE — ED Provider Notes (Signed)
[facility] EMERGENCY DEPARTMENT Provider Note   CSN: [id number] Arrival date & time: 02/17/18  1034     History   Chief Complaint Chief Complaint  Patient presents with  . Hand Pain    s/p AV fistula left upper arm 12-08-17    HPI Jonathan Bracamonte Sr. is a 67 y.o. male presenting for 2 weeks of increased left fifth finger swelling and pain.  Patient states that approximately 1 month ago he developed swelling and pain to his left fifth finger, he was seen by his nephrologist and prescribed naproxen for 1 week.  Patient states that the naproxen helped with the swelling and pain and he returned to normal however 2 weeks ago the swelling and pain returned.  Patient has been treating with Tylenol as needed without relief.  Patient describes pain as a throbbing that is worsened with movement and palpation. Patient and family member report history of gout. Patient denies history of fever or other concerns at this time.  Of note the patient had a AV fistula placed in his left upper arm at the beginning of May, approximately 2 months ago, they have not begun using this site yet for dialysis. Patient states his last dialysis treatment was yesterday.  HPI  Past Medical History:  Diagnosis Date  . Diabetes mellitus without complication ([place])   . Hypertension   . Renal disorder 2015   right kidney transplant  . Stroke [facility])     Patient Active Problem List   Diagnosis Date Noted  . Chronic hepatitis C without hepatic coma ([place]) 09/25/2017  . Diabetes mellitus type 2, uncomplicated ([place]) 25/63/8937  . Myocardial infarction ([place]) 09/25/2017  . Renal transplant recipient 09/25/2017  . Transfusion history 09/25/2017  . History of coronary artery disease 09/12/2016  . 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 MI 09/12/2016  . Aftercare following organ transplant 09/11/2016  . 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 At risk for infection  transmitted from donor 06/16/2014  . Ureteral stent displacement ([place]) 06/10/2014  . Prophylactic antibiotic 06/09/2014  . Immunosuppression ([place]) 06/06/2014    Past Surgical History:  Procedure Laterality Date  . BACK SURGERY     Has had 2 back surgeries  . Depression    . HAND SURGERY Right   . HAND SURGERY    . KIDNEY TRANSPLANT     November 2015  . Memory loss    . NEPHRECTOMY TRANSPLANTED ORGAN          Home Medications    Prior to Admission medications   Medication Sig Start Date End Date Taking? Authorizing Provider  allopurinol (ZYLOPRIM) 100 MG tablet Take by mouth. 06/09/14   [provider]  amLODipine (NORVASC) 10 MG tablet Take by mouth. 06/09/14   [provider]  aspirin 81 MG chewable tablet Chew by mouth. 06/09/14   [provider]  atorvastatin (LIPITOR) 40 MG tablet Take 40 mg by mouth daily.    [provider]  b complex-vitamin c-folic acid (NEPHRO-VITE) 0.8 MG TABS tablet Take 1 tablet by mouth at bedtime.    [provider]  furosemide (LASIX) 20 MG tablet Take by mouth.    [provider]  insulin aspart (NOVOLOG FLEXPEN) 100 UNIT/ML FlexPen Inject into the skin.    [provider]  insulin degludec (TRESIBA FLEXTOUCH) 100 UNIT/ML SOPN FlexTouch Pen Inject into the skin.    [provider]  Insulin Pen Needle (BD PEN NEEDLE NANO U/F)  32G X 4 MM MISC U TO INJECT 5 TIMES A DAY 09/10/16   [provider]  losartan (COZAAR) 25 MG tablet TAKE 1 TABLET BY MOUTH EVERY DAY 09/14/16   [provider]  metoCLOPramide (REGLAN) 5 MG tablet Take 5 mg by mouth 3 (three) times daily with meals.    [provider]  metoprolol succinate (TOPROL-XL) 50 MG 24 hr tablet Take 25 mg by mouth.  06/09/14   [provider]  mycophenolate (MYFORTIC) 180 MG EC tablet TAKE TWO TABLETS BY MOUTH EVERY 12 HOURS 01/04/17   [provider]  naproxen (NAPROSYN) 375 MG tablet Take 1  tablet (375 mg total) by mouth 2 (two) times daily for 5 days. 02/17/18 02/22/18  Deliah Boston, PA-C  NOVOLOG FLEXPEN 100 UNIT/ML FlexPen  05/29/17   [provider]  omeprazole (PRILOSEC) 20 MG capsule TAKE 2 CAPSULES BY MOUTH EVERY DAY 01/11/17   [provider]  oxyCODONE-acetaminophen (PERCOCET/ROXICET) 5-325 MG tablet Take 1 tablet by mouth every 8 (eight) hours as needed for up to 3 days for severe pain. 02/17/18 02/20/18  Nuala Alpha A, PA-C  predniSONE (DELTASONE) 10 MG tablet Take 4 tablets (40 mg total) by mouth daily for 5 days. 02/17/18 02/22/18  Nuala Alpha A, PA-C  predniSONE (DELTASONE) 5 MG tablet TAKE 1 TABLET BY MOUTH EVERY DAY 03/17/17   [provider]  senna (SENOKOT) 8.6 MG tablet Take 1 tablet by mouth as needed for constipation.    [provider]  simvastatin (ZOCOR) 20 MG tablet Take by mouth. 09/12/16   [provider]  tacrolimus (PROGRAF) 1 MG capsule TAKE 4 CAPSULES BY MOUTH EVERY 12 HOURS 04/28/17   [provider]  TRESIBA FLEXTOUCH 200 UNIT/ML SOPN INJECT [place] SKIN DAILY 05/19/17   [provider]  Vitamin D, Ergocalciferol, (DRISDOL) 50000 units CAPS capsule Take by mouth.    [provider]  warfarin (COUMADIN) 5 MG tablet Take 5 mg by mouth daily.    [provider]    Family History History reviewed. No pertinent family history.  Social History Social History   Tobacco Use  . Smoking status: Former Research scientist (life sciences)  . Smokeless tobacco: Never Used  Substance Use Topics  . Alcohol use: Never    Frequency: Never  . Drug use: Never     Allergies   Patient has no known allergies.   Review of Systems Review of Systems  Constitutional: Negative.  Negative for chills, fatigue and fever.  HENT: Negative.  Negative for congestion, sore throat and trouble swallowing.   Eyes: Negative.  Negative for visual disturbance.  Respiratory: Negative.  Negative for cough,  chest tightness and shortness of breath.   Cardiovascular: Negative.  Negative for chest pain and leg swelling.  Gastrointestinal: Negative.  Negative for abdominal pain, blood in stool, diarrhea, nausea and vomiting.  Genitourinary: Negative.   Musculoskeletal: Positive for arthralgias and joint swelling. Negative for myalgias and neck pain.  Skin: Negative.  Negative for rash.  Neurological: Negative.  Negative for dizziness, syncope, weakness, light-headedness and headaches.     Physical Exam Updated Vital Signs BP 136/76   Pulse 88   Temp 98.5 F (36.9 C) (Oral)   Resp 16   SpO2 99%   Physical Exam  Constitutional: He is oriented to person, place, and time. He appears well-developed and well-nourished. No distress.  HENT:  Head: Normocephalic and atraumatic.  Eyes: Pupils are equal, round, and reactive to light.  Neck: Normal range of motion. Neck supple. No JVD present. No tracheal deviation present.  Cardiovascular: Normal rate and regular rhythm.  Pulmonary/Chest: Effort normal and breath sounds normal. No respiratory distress.  Abdominal: Soft. There is no tenderness. There is no rebound and no guarding.  Musculoskeletal:       Left wrist: He exhibits tenderness and swelling. He exhibits no deformity.       Left hand: He exhibits tenderness and swelling. Normal sensation noted.       Hands: Mild amount of swelling to the left fifth finger, finger appears slightly darker in color compared to his other fingers.  Patient endorses pain with palpation to the entire left fifth finger. Sensation, cap refill intact to fifth left finger, patient is able to flex and extend and abduct finger with some pain. Patient is also tender to palpation along the left wrist, increased pain with movement of the left wrist. Left radial pulse slightly diminished.  Neurological: He is alert and oriented to person, place, and time. No sensory deficit.  Skin: Skin is warm and dry. Capillary refill  takes less than 2 seconds.  Psychiatric: He has a normal mood and affect. His behavior is normal.     ED Treatments / Results  Labs (all labs ordered are listed, but only abnormal results are displayed) Labs Reviewed - No data to display  EKG None  Radiology Dg Hand Complete Left  Result Date: 02/17/2018 CLINICAL DATA:  Pain and swelling. EXAM: LEFT HAND - COMPLETE 3+ VIEW COMPARISON:  None. FINDINGS: There is no evidence of fracture or dislocation. There is no evidence of arthropathy or other focal bone abnormality. Vascular calcifications are identified. IMPRESSION: 1. No acute bone abnormality. 2. Vascular calcifications Electronically Signed   By: Kerby Moors M.D.   On: 02/17/2018 13:16    Procedures Procedures (including critical care time)  Medications Ordered in ED Medications  oxyCODONE-acetaminophen (PERCOCET/ROXICET) 5-325 MG per tablet 1 tablet (1 tablet Oral Given 02/17/18 1415)     Initial Impression / Assessment and Plan / ED Course  I have reviewed the triage vital signs and the nursing notes.  Pertinent labs & imaging results that were available during my care of the patient were reviewed by me and considered in my medical decision making (see chart for details).    Patient seen by Dr. Wilson Singer. Radiographs negative for bone abnormality.  Patient's pain and swelling likely related to his history of gout.  Patient reports that his pain and swelling had improved with previous course of naproxen prescribed by his nephrologist.    Patient is afebrile, able to move his finger and wrist with some pain, no signs suggesting septic joint or cellulitis, no erythema, streaking, fluctuance, or increased warmth or wound noted.  Steal syndrome is in the differential however due to presentation and history it is most likely that patient's pain is gout related.  Pain is in wrist and left pinky, however not in any of his other fingers, cap refill intact, less likely that  ischemia is the cause of his pain.  Patient to be discharged with another course of naproxen, a 5-day increased dose of his daily prednisone as well as Percocet for the next 3 days as needed.  Patient and family state understanding of care plan and state they will follow-up with their nephrologist, primary care provider as well as the vascular surgeon.  Patient and family state understanding of increased bleeding risk while using naproxen with Coumadin.  Patient and family  informed to return to the emergency department for any signs of bleeding including change in stool color.  Patient is to follow-up with his nephrologist regarding his visit today as well as his vascular surgeon.  At this time there does not appear to be any evidence of an acute emergency medical condition and the patient appears stable for discharge with appropriate outpatient follow up. Diagnosis was discussed with patient who verbalizes understanding and is agreeable to discharge. I have discussed return precautions with patient and family who verbalize understanding of return precautions. Patient strongly encouraged to follow-up with their PCP.  Patient's case discussed with and seen by Dr. Wilson Singer who agrees medication changes and plan to discharge with follow-up.     This note was dictated using DragonOne dictation software; please contact for any inconsistencies within the note.   Final Clinical Impressions(s) / ED Diagnoses   Final diagnoses:  Finger pain, left  Left wrist pain    ED Discharge Orders        Ordered    predniSONE (DELTASONE) 10 MG tablet  Daily,   Status:  Discontinued     02/17/18 1502    naproxen (NAPROSYN) 375 MG tablet  2 times daily,   Status:  Discontinued     02/17/18 1502    oxyCODONE-acetaminophen (PERCOCET/ROXICET) 5-325 MG tablet  Every 8 hours PRN     02/17/18 1502    predniSONE (DELTASONE) 10 MG tablet  Daily     02/17/18 1507    naproxen (NAPROSYN) 375 MG tablet  2 times daily      02/17/18 1508       Gari Crown 02/17/18 1659    Virgel Manifold, MD 02/19/18 [phone]

## 2018-02-17 NOTE — ED Provider Notes (Signed)
Medical screening examination/treatment/procedure(s) were conducted as a shared visit with non-physician practitioner(s) and myself.  I personally evaluated the patient during the encounter.  None  66yM with L little finger pain for weeks and now also L wrist pain for past day. Suspect this is 2/2 gout. Hx of the same. Consider steal syndrome after AV fistula surgery one month ago, but doubt. Hand/fingers feels warm to touch and no different as compared to the R side. Wouldn't expect pain from vascular compromise to affect both wrist and little finger but spare all other fingers and the hand. I think infectious etiology is also less likely. He reports improvement of symptoms while on naproxen. He says this was advised by his nephrologist and, per notes, the long term plan is for intermittent HD as the prognosis for allograft is poor. It sounds like NSAIDs should be fine in his case. Will also prescribe prednisone and PRN opiate. Follow-up with nephrology. He reports upcoming appointment with his vascular surgeon.   (01/09/2018) "kidney allograft prognosis deemed poor and risk of further treatment (Thymo and PLEX) was thought to outweigh the benefit d/t need for chronic anticoagulation and dementia. Therefore, joint decision with the transplant team, patient, and patient's family was to stop any further treatments of rejection and continue with iHD long-term. For long-term iHD access, patient underwent creation of LUE looped AVF 12/08/17. Post-surgery, anticoagulation was restarted and patient successfully bridged to Coumadin with goal INR 2-3."       Virgel Manifold, MD 02/17/18 1430

## 2018-02-17 NOTE — ED Notes (Signed)
Patient able to move finger on left hand. sensation in tack, warm to touch, discoloration noted to the small finger.

## 2018-02-22 ENCOUNTER — Ambulatory Visit: Payer: Medicare Other | Admitting: Physical Therapy

## 2018-02-22 ENCOUNTER — Ambulatory Visit: Payer: Medicare Other | Admitting: Occupational Therapy

## 2018-02-22 ENCOUNTER — Encounter: Payer: Self-pay | Admitting: Physical Therapy

## 2018-02-22 DIAGNOSIS — R2689 Other abnormalities of gait and mobility: Secondary | ICD-10-CM

## 2018-02-22 DIAGNOSIS — M6281 Muscle weakness (generalized): Secondary | ICD-10-CM

## 2018-02-22 DIAGNOSIS — R278 Other lack of coordination: Secondary | ICD-10-CM

## 2018-02-22 DIAGNOSIS — I69315 Cognitive social or emotional deficit following cerebral infarction: Secondary | ICD-10-CM

## 2018-02-22 NOTE — Patient Instructions (Signed)
  Coordination Activities  Perform the following activities for 20 minutes 1 times per day with left hand(s).   Rotate ball in fingertips (clockwise and counter-clockwise).  Toss ball between hands.  Toss ball in air and catch with the same hand.  Flip cards 1 at a time as fast as you can.  Deal cards with your thumb (Hold deck in hand and push card off top with thumb).  Pick up coins and place in container or coin bank.  Pick up coins and stack.  Pick up coins one at a time until you get 5-10 in your hand, then move coins from palm to fingertips to stack one at a time.

## 2018-02-22 NOTE — Therapy (Signed)
[facility] [address] [place], [place], [address] Phone: [phone]   Fax:  [phone]  Occupational Therapy Treatment  Patient Details  Name: Jonathan Seltzer Sr. MRN: [id number] Date of Birth: 1950-12-19 Referring Provider: Reather Laurence. Blanch Media   Encounter Date: 02/22/2018  OT End of Session - 02/22/18 0926    Visit Number  2    Number of Visits  25    Date for OT Re-Evaluation  05/14/18    Authorization Type  [employer] HRT    Authorization Time Period  12 visits approved through 04/12/18    OT Start Time  0847    OT Stop Time  0930    OT Time Calculation (min)  43 min    Behavior During Therapy  [facility] for tasks assessed/performed       Past Medical History:  Diagnosis Date  . Diabetes mellitus without complication ([place])   . Hypertension   . Renal disorder 2015   right kidney transplant  . Stroke [facility])     Past Surgical History:  Procedure Laterality Date  . BACK SURGERY     Has had 2 back surgeries  . Depression    . HAND SURGERY Right   . HAND SURGERY    . KIDNEY TRANSPLANT     November 2015  . Memory loss    . NEPHRECTOMY TRANSPLANTED ORGAN      There were no vitals filed for this visit.  Subjective Assessment - 02/22/18 0848    Subjective   Pt reports 3 falls since he was last visit    Pertinent History  2015-kidney transplant; 2017-ministrokes; port of R side of chest; fistula on L upper arm (No BP in LUE); ESRD, CVA, Hep C per hx    Patient Stated Goals  regain use of LUE    Currently in Pain?  Yes    Pain Score  1     Pain Location  Finger (Comment which one)    Pain Orientation  Left    Pain Descriptors / Indicators  Aching    Pain Type  Acute pain    Pain Onset  In the past 7 days    Pain Frequency  Intermittent    Aggravating Factors   movement    Pain Relieving Factors  inactivity    Multiple Pain Sites  No                   Treatment: copying small peg design using LUE  for fine motor coordination with a cognitive component, mod difficulty, mod v.c for correct design and in hand manipulation. HEP issued, see pt instructions        OT Education - 02/22/18 0925    Education Details  Coordination HEP, min-mod v.c, see pt instructions    Person(s) Educated  Patient    Methods  Explanation;Demonstration;Verbal cues;Handout    Comprehension  Verbalized understanding;Returned demonstration;Verbal cues required;Need further instruction       OT Short Term Goals - 02/13/18 1249      OT SHORT TERM GOAL #1   Title  Pt/ caregiver will be I with inital HEP.  due 03/15/18    Time  4    Period  Weeks    Status  New    Target Date  03/15/18      OT SHORT TERM GOAL #2   Title  Pt will perform UB dressing with setup/ supervision, LB dressing with min A.    Time  4    Period  Weeks    Status  New      OT SHORT TERM GOAL #3   Title  Pt will use LUE as a non-dominant assist at least 25 % of the time for ADLs/ IADLS.    Baseline  currently pt does not use LUE functionally    Time  4    Period  Weeks    Status  New      OT SHORT TERM GOAL #4   Title  Pt will demonstrate improved LUE fine motor coordination as evidenced by performing 9 hole peg test in 90 secs or less.    Baseline  RUE 49.59 secs, LUE  1 min 51 secs    Time  4    Period  Weeks    Status  New      OT SHORT TERM GOAL #5   Title  Pt / family will verbalize understanding of LUE postioning to minimize pain, edema and risk for injury.    Baseline  moderate edema in left hand    Time  4    Period  Weeks    Status  New      Additional Short Term Goals   Additional Short Term Goals  Yes      OT SHORT TERM GOAL #6   Title  Pt/ caregiver will verbalize understanding of compensatory strategies for short term memory and cognitive deficits.    Time  4    Period  Weeks    Status  New      OT SHORT TERM GOAL #7   Title  Pt will perform a simple task requiring organization and problem solving  with 75% accuracy.    Time  4    Period  Weeks    Status  New      OT SHORT TERM GOAL #8   Title  Further assess cognition prn and add additional goals as needed    Time  4    Period  Weeks    Status  New        OT Long Term Goals - 02/13/18 1254      OT LONG TERM GOAL #1   Title  Pt/ caregiver will be I with updated HEP.    Time  12    Period  Weeks    Status  New    Target Date  05/14/18      OT LONG TERM GOAL #2   Title  Pt will perform all basic ADLS modified independently.    Time  12    Period  Weeks    Status  New      OT LONG TERM GOAL #3   Title  Pt will perform simple snack or beverage prep with supervision .    Time  12    Period  Weeks    Status  New      OT LONG TERM GOAL #4   Title  Pt will use LUE as a non-dominant assist at least 50% of the time for ADLs/ IADLs.    Time  12    Period  Weeks    Status  New      OT LONG TERM GOAL #5   Title  Pt will demonstrate improved LUE fine motor coordination for ADLS as evidenced by performing 9-hole peg test in 60 secs or less.    Time  12    Period  Weeks    Status  New      OT LONG TERM GOAL #6   Title  Pt will perfrom simple home management tasks with supervision demonstrating good safety awareness.    Time  12    Period  Weeks    Status  New            Plan - 02/22/18 0849    Clinical Impression Statement  Pt is progressing towards goals. He demonstrates understanding of HEP, however he may benefit from reinforcement.    Occupational Profile and client history currently impacting functional performance   Prior to pt's CVA he was I with ADLS, driving and enjoyed exercise at [employer]. PMH ESRD, dialysis, hx of CVA, 2015-kidney transplant; 2017-ministrokes; port of R side of chest; fistula on L upper arm (No BP in LUE);     Occupational performance deficits (Please refer to evaluation for details):  IADL's;ADL's;Play;Leisure;Social Participation    Rehab Potential  Good    Current  Impairments/barriers affecting progress:  cognitive deficits, dialysis, complex medical hx    OT Frequency  2x / week    OT Duration  12 weeks    OT Treatment/Interventions  Self-care/ADL training;Therapeutic exercise;Patient/family education;Splinting;Neuromuscular education;Balance training;Therapeutic activities;Functional Mobility Training;Energy conservation;Fluidtherapy;Ultrasound;Cryotherapy;DME and/or AE instruction;Manual Therapy;Passive range of motion;Cognitive remediation/compensation;Visual/perceptual remediation/compensation;Moist Heat;Paraffin;Aquatic Therapy    Plan  consider putty if pt's hand pain is better, functional use of LUE    Consulted and Agree with Plan of Care  Patient       Patient will benefit from skilled therapeutic intervention in order to improve the following deficits and impairments:  Abnormal gait, Decreased cognition, Decreased knowledge of use of DME, Increased edema, Pain, Impaired flexibility, Impaired sensation, Decreased coordination, Decreased mobility, Decreased activity tolerance, Decreased endurance, Decreased range of motion, Decreased strength, Impaired UE functional use, Impaired perceived functional ability, Difficulty walking, Decreased safety awareness, Decreased knowledge of precautions, Decreased balance  Visit Diagnosis: Other lack of coordination  Muscle weakness (generalized)  Cognitive social or emotional deficit following cerebral infarction    Problem List Patient Active Problem List   Diagnosis Date Noted  . Chronic hepatitis C without hepatic coma ([place]) 09/25/2017  . Diabetes mellitus type 2, uncomplicated ([place]) 64/15/8309  . Myocardial infarction ([place]) 09/25/2017  . Renal transplant recipient 09/25/2017  . Transfusion history 09/25/2017  . History of coronary artery disease 09/12/2016  . 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 MI 09/12/2016  . Aftercare following organ transplant 09/11/2016  . Mineral metabolism  disorder 09/11/2016  . 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 At risk for infection transmitted from donor 06/16/2014  . Ureteral stent displacement ([place]) 06/10/2014  . Prophylactic antibiotic 06/09/2014  . Immunosuppression ([place]) 06/06/2014    Deziree Mokry 02/22/2018, 9:28 AM  [facility] [address] [place], [place], [address] Phone: [phone]   Fax:  [phone]  Name: Jonathan Westry Sr. MRN: [id number] Date of Birth: 1951-03-06

## 2018-02-22 NOTE — Therapy (Signed)
[facility] [address] [place], [place], [address] Phone: [phone]   Fax:  [phone]  Physical Therapy Treatment  Patient Details  Name: Jonathan [name] Sr. MRN: [id number] Date of Birth: Feb 25, 1951 Referring Provider: Reather Laurence. Blanch Media   Encounter Date: 02/22/2018  PT End of Session - 02/22/18 1959    Visit Number  2    Number of Visits  17    Date for PT Re-Evaluation  05/14/18    Authorization Type  [employer] H[employer] Medicare Replacement-12 visits approved for PT; 10th visit progress note needed; [id number]    Authorization Time Period  02-12-18 through 04-12-18    Authorization - Visit Number  1    Authorization - Number of Visits  12    PT Start Time  0802    PT Stop Time  0842    PT Time Calculation (min)  40 min    Equipment Utilized During Treatment  Gait belt    Activity Tolerance  Patient tolerated treatment well    Behavior During Therapy  WFL for tasks assessed/performed       Past Medical History:  Diagnosis Date  . Diabetes mellitus without complication ([place])   . Hypertension   . Renal disorder 2015   right kidney transplant  . Stroke [facility])     Past Surgical History:  Procedure Laterality Date  . BACK SURGERY     Has had 2 back surgeries  . Depression    . HAND SURGERY Right   . HAND SURGERY    . KIDNEY TRANSPLANT     November 2015  . Memory loss    . NEPHRECTOMY TRANSPLANTED ORGAN      There were no vitals filed for this visit.  Subjective Assessment - 02/22/18 0804    Subjective  Reports ED visit due to hand swelling and pain-they thought it was gout and is better now.  Reports one fall going to the bathroom, then two other falls since eval due to legs giving way.  Reports he was not using rollator either of those falls.    Patient is accompained by:  Family member wife, Jonathan [name]    Pertinent History  2015-kidney transplant; 2017-ministrokes; port of R side of chest; fistula on L upper  arm (No BP in LUE); Independent prior to hospitalization, 3 weeks in hospital>[facility]>home    Patient Stated Goals  Pt's goal is to "learn how to walk again."  Wife's goal is to get stronger and get balance better.    Currently in Pain?  No/denies                       [facility] Adult PT Treatment/Exercise - 02/22/18 0808      Ambulation/Gait   Ambulation/Gait  Yes    Ambulation/Gait Assistance  5: Supervision    Ambulation/Gait Assistance Details  Pt tends to leave rollator and walk away to sit.  Pt tends to push up on rollator to stand and keep hands on rollator to sit down.    Ambulation Distance (Feet)  100 Feet x 3    Assistive device  Rollator    Gait Pattern  Step-through pattern;Decreased hip/knee flexion - left;Decreased step length - left;Decreased stance time - left    Ambulation Surface  Level;Indoor    Pre-Gait Activities  Cues provided for hand placement and for use of rollator fully around to safely sit down.  Educated patient and wife in hand placement/correct use of rollator;  educated pt and wife in need to use rollator at all times (based on fall risk at eval and 3 reported falls since eval).      Exercises   Exercises  Knee/Hip;Ankle      Knee/Hip Exercises: Aerobic   Nustep  Level 2, 4 extremities x 8 minutes, for lower extremity strengthening.      Knee/Hip Exercises: Seated   Long Arc Quad  Strengthening;Right;Left;2 sets;10 reps    Marching  Strengthening;Right;Left;2 sets;10 reps    Hamstring Curl  Strengthening;Right;Left;2 sets;10 reps red theraband    Sit to [employer]  1 set;5 reps;with UE support;without UE support Cues for upright posture upon standing      Ankle Exercises: Seated   Heel Raises  Right;Left;10 reps 2 sets    Toe Raise  10 reps 2 sets              PT Education - 02/22/18 1958    Education Details  HEP, educated in safety with rollator walker and need to use rollator walker at all times due to fall risk and hx of 3  falls since PT evaluation    Person(s) Educated  Patient;Spouse wife present at end of session    Methods  Explanation;Demonstration;Handout    Comprehension  Verbalized understanding;Returned demonstration;Verbal cues required;Need further instruction       PT Short Term Goals - 02/13/18 1055      PT SHORT TERM GOAL #1   Title  Pt will perform HEP with family's supervision for improved balance, strength, and gait.  TARGET (after waiting 1 week for auth):03/16/18    Time  4    Period  Weeks    Status  New    Target Date  03/16/18      PT SHORT TERM GOAL #2   Title  Pt will improve BERG Balance score to at least 32/56 for decreased fall risk.    Time  4    Period  Weeks    Status  New    Target Date  03/16/18      PT SHORT TERM GOAL #3   Title  Pt will improve TUG to less than or equal to 22 seconds for decreased fall risk.    Time  4    Period  Weeks    Status  New    Target Date  03/16/18      PT SHORT TERM GOAL #4   Title  Pt will perform sit<>stand transfers, at least 8 of 10 reps with minimal to no UE support, for improved safety and efficiency with transfers.    Time  4    Period  Weeks    Status  New    Target Date  03/16/18      PT SHORT TERM GOAL #5   Title  Pt/wife will verbalize understanding of fall prevention in home environment.    Time  4    Period  Weeks    Status  New    Target Date  03/16/18        PT Long Term Goals - 02/13/18 1058      PT LONG TERM GOAL #1   Title  Pt will improve gait velocity to at least 2.3 ft/sec for improved gait efficiency and safety.  TARGET 04/13/18    Time  8    Period  Weeks    Status  New    Target Date  04/13/18      PT LONG TERM GOAL #2  Title  Pt will improve Berg Balance score to at least 40/56 for decreased fall risk.    Time  8    Period  Weeks    Status  New    Target Date  04/13/18      PT LONG TERM GOAL #3   Title  Pt will improve TUG score to less than or equal to 15 seconds for decreased fall risk.     Time  8    Period  Weeks    Status  New    Target Date  04/13/18      PT LONG TERM GOAL #4   Title  Pt will ambulate at least 1000 ft, indoor and outdoor surfaces, modified independently, for improved outdoor and community gait.    Time  8    Period  Weeks    Status  New    Target Date  04/13/18      PT LONG TERM GOAL #5   Title  Pt will ambulate household distances, 50-100 ft using cane, with supervision, for improved independence with gait.    Time  8    Period  Weeks    Status  New    Target Date  04/13/18      Additional Long Term Goals   Additional Long Term Goals  Yes      PT LONG TERM GOAL #6   Title  Pt/wife will verbalize plans for continued community fitness upon d/c from PT.    Time  8    Period  Weeks    Status  New    Target Date  04/13/18            Plan - 02/22/18 2000    Clinical Impression Statement  HEP initiated this visit to address lower extremity weakness.  Pt reports several falls since PT eval, that his legs gave way after being weak from dialysis; in each instance, he was not using rollator.  Educated patient and wife in need to use rollator at all times.  Pt will continue to benefit from skilled PT to address strength, balance, and safety with gait.    Rehab Potential  Good    Clinical Impairments Affecting Rehab Potential  Cognitive communication deficit (wife present at eval, family supportive)    PT Frequency  2x / week    PT Duration  8 weeks plus eval    PT Treatment/Interventions  ADLs/Self Care Home Management;Gait training;Functional mobility training;Therapeutic activities;Therapeutic exercise;Balance training;DME Instruction;Neuromuscular re-education;Patient/family education;Orthotic Fit/Training    PT Next Visit Plan  Review transfer safety and technique (with use of rollator); review HEP for strength and add balance to HEP as able; gait training  ; Fall Prevention education   Consulted and Agree with Plan of Care  Patient;Family  member/caregiver    Family Member Consulted  wife present at end of session       Patient will benefit from skilled therapeutic intervention in order to improve the following deficits and impairments:  Abnormal gait, Decreased balance, Decreased knowledge of use of DME, Decreased mobility, Decreased safety awareness, Difficulty walking, Decreased strength  Visit Diagnosis: Muscle weakness (generalized)  Other abnormalities of gait and mobility     Problem List Patient Active Problem List   Diagnosis Date Noted  . Chronic hepatitis C without hepatic coma ([place]) 09/25/2017  . Diabetes mellitus type 2, uncomplicated ([place]) 29/52/8413  . Myocardial infarction ([place]) 09/25/2017  . Renal transplant recipient 09/25/2017  . Transfusion history 09/25/2017  . History  of coronary artery disease 09/12/2016  . 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 MI 09/12/2016  . Aftercare following organ transplant 09/11/2016  . 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 At risk for infection transmitted from donor 06/16/2014  . Ureteral stent displacement ([place]) 06/10/2014  . Prophylactic antibiotic 06/09/2014  . Immunosuppression ([place]) 06/06/2014    Senia Even W. 02/22/2018, 8:04 PM  Mady Haagensen, PT 02/22/18 8:05 PM Phone: [phone] Fax: [facility] [address] [place], [place], [address] Phone: [phone]   Fax:  [phone]  Name: Norton Bivins Sr. MRN: [id number] Date of Birth: 1951-06-07

## 2018-02-22 NOTE — Patient Instructions (Signed)
Access Code: [email or web address]  URL: [email or web address]  Date: 02/22/2018  Prepared by: Mady Haagensen   Exercises  Seated March - 10 reps - 2 sets - 1x daily - 3-5x weekly  Seated Long Arc Quad - 10 reps - 2 sets - 1x daily - 3-5x weekly  Seated Hamstring Curl with Anchored Resistance - 10 reps - 2 sets - 1x daily - 3-5x weekly  Seated Heel Toe Raises - 10 reps - 2 sets - 1x daily - 3-5x weekly

## 2018-02-27 ENCOUNTER — Ambulatory Visit: Payer: Medicare Other | Admitting: Physical Therapy

## 2018-02-27 ENCOUNTER — Encounter: Payer: Self-pay | Admitting: Physical Therapy

## 2018-02-27 ENCOUNTER — Ambulatory Visit: Payer: Medicare Other | Admitting: Occupational Therapy

## 2018-02-27 DIAGNOSIS — R2689 Other abnormalities of gait and mobility: Secondary | ICD-10-CM

## 2018-02-27 DIAGNOSIS — R2681 Unsteadiness on feet: Secondary | ICD-10-CM

## 2018-02-27 DIAGNOSIS — M6281 Muscle weakness (generalized): Secondary | ICD-10-CM

## 2018-02-27 DIAGNOSIS — I69315 Cognitive social or emotional deficit following cerebral infarction: Secondary | ICD-10-CM

## 2018-02-27 DIAGNOSIS — R278 Other lack of coordination: Secondary | ICD-10-CM

## 2018-02-27 NOTE — Patient Instructions (Addendum)

## 2018-02-28 NOTE — Therapy (Signed)
[facility] [address] [place], [place], [address] Phone: [phone]   Fax:  [phone]  Occupational Therapy Treatment  Patient Details  Name: Jonathan Repsher Sr. MRN: [id number] Date of Birth: 10-31-1950 Referring Provider: Reather Laurence. Blanch Media   Encounter Date: 02/27/2018  OT End of Session - 02/27/18 1037    Visit Number  3    Number of Visits  25    Date for OT Re-Evaluation  05/14/18    Authorization Type  [employer] HRT    Authorization Time Period  12 visits approved through 04/12/18    Authorization - Visit Number  3    Authorization - Number of Visits  12    OT Start Time  1017    OT Stop Time  1100    OT Time Calculation (min)  43 min    Behavior During Therapy  WFL for tasks assessed/performed       Past Medical History:  Diagnosis Date  . Diabetes mellitus without complication ([place])   . Hypertension   . Renal disorder 2015   right kidney transplant  . Stroke [facility])     Past Surgical History:  Procedure Laterality Date  . BACK SURGERY     Has had 2 back surgeries  . Depression    . HAND SURGERY Right   . HAND SURGERY    . KIDNEY TRANSPLANT     November 2015  . Memory loss    . NEPHRECTOMY TRANSPLANTED ORGAN      There were no vitals filed for this visit.  Subjective Assessment - 02/27/18 1022    Subjective   Denies falls since last visit    Pertinent History  2015-kidney transplant; 2017-ministrokes; port of R side of chest; fistula on L upper arm (No BP in LUE); ESRD, CVA, Hep C per hx    Patient Stated Goals  regain use of LUE    Currently in Pain?  No/denies             Treatment: Matching shapes with the perfection pegboard with LUE increased time and min-mod difficulty for correct placement/ manipulation. Standing to place/ remove graded clothespins from vertical antennae with LUE, min v.c for performance and LUE functional use. Therapist reinforced safety with pt/ wife and  discussed importance of walker use and sitting to donn pants over legs..                OT Short Term Goals - 02/13/18 1249      OT SHORT TERM GOAL #1   Title  Pt/ caregiver will be I with inital HEP.  due 03/15/18    Time  4    Period  Weeks    Status  New    Target Date  03/15/18      OT SHORT TERM GOAL #2   Title  Pt will perform UB dressing with setup/ supervision, LB dressing with min A.    Time  4    Period  Weeks    Status  New      OT SHORT TERM GOAL #3   Title  Pt will use LUE as a non-dominant assist at least 25 % of the time for ADLs/ IADLS.    Baseline  currently pt does not use LUE functionally    Time  4    Period  Weeks    Status  New      OT SHORT TERM GOAL #4   Title  Pt will  demonstrate improved LUE fine motor coordination as evidenced by performing 9 hole peg test in 90 secs or less.    Baseline  RUE 49.59 secs, LUE  1 min 51 secs    Time  4    Period  Weeks    Status  New      OT SHORT TERM GOAL #5   Title  Pt / family will verbalize understanding of LUE postioning to minimize pain, edema and risk for injury.    Baseline  moderate edema in left hand    Time  4    Period  Weeks    Status  New      Additional Short Term Goals   Additional Short Term Goals  Yes      OT SHORT TERM GOAL #6   Title  Pt/ caregiver will verbalize understanding of compensatory strategies for short term memory and cognitive deficits.    Time  4    Period  Weeks    Status  New      OT SHORT TERM GOAL #7   Title  Pt will perform a simple task requiring organization and problem solving with 75% accuracy.    Time  4    Period  Weeks    Status  New      OT SHORT TERM GOAL #8   Title  Further assess cognition prn and add additional goals as needed    Time  4    Period  Weeks    Status  New        OT Long Term Goals - 02/13/18 1254      OT LONG TERM GOAL #1   Title  Pt/ caregiver will be I with updated HEP.    Time  12    Period  Weeks    Status  New     Target Date  05/14/18      OT LONG TERM GOAL #2   Title  Pt will perform all basic ADLS modified independently.    Time  12    Period  Weeks    Status  New      OT LONG TERM GOAL #3   Title  Pt will perform simple snack or beverage prep with supervision .    Time  12    Period  Weeks    Status  New      OT LONG TERM GOAL #4   Title  Pt will use LUE as a non-dominant assist at least 50% of the time for ADLs/ IADLs.    Time  12    Period  Weeks    Status  New      OT LONG TERM GOAL #5   Title  Pt will demonstrate improved LUE fine motor coordination for ADLS as evidenced by performing 9-hole peg test in 60 secs or less.    Time  12    Period  Weeks    Status  New      OT LONG TERM GOAL #6   Title  Pt will perfrom simple home management tasks with supervision demonstrating good safety awareness.    Time  12    Period  Weeks    Status  New            Plan - 02/27/18 1038    Clinical Impression Statement  Pt is progressing towards goals. He requires increased time for coordination acitivites  requiring a cognitive component.    Occupational Profile and  client history currently impacting functional performance   Prior to pt's CVA he was I with ADLS, driving and enjoyed exercise at [facility] PMH ESRD, dialysis, hx of CVA, 2015-kidney transplant; 2017-ministrokes; port of R side of chest; fistula on L upper arm (No BP in LUE);     Occupational performance deficits (Please refer to evaluation for details):  IADL's;ADL's;Play;Leisure;Social Participation    Rehab Potential  Good    Current Impairments/barriers affecting progress:  cognitive deficits, dialysis, complex medical hx    OT Frequency  2x / week    OT Duration  12 weeks    OT Treatment/Interventions  Self-care/ADL training;Therapeutic exercise;Patient/family education;Splinting;Neuromuscular education;Balance training;Therapeutic activities;Functional Mobility Training;Energy  conservation;Fluidtherapy;Ultrasound;Cryotherapy;DME and/or AE instruction;Manual Therapy;Passive range of motion;Cognitive remediation/compensation;Visual/perceptual remediation/compensation;Moist Heat;Paraffin;Aquatic Therapy    Plan  consider putty if pt's hand pain is better, continue to addess functional use of LUE and cognition    Consulted and Agree with Plan of Care  Patient       Patient will benefit from skilled therapeutic intervention in order to improve the following deficits and impairments:  Abnormal gait, Decreased cognition, Decreased knowledge of use of DME, Increased edema, Pain, Impaired flexibility, Impaired sensation, Decreased coordination, Decreased mobility, Decreased activity tolerance, Decreased endurance, Decreased range of motion, Decreased strength, Impaired UE functional use, Impaired perceived functional ability, Difficulty walking, Decreased safety awareness, Decreased knowledge of precautions, Decreased balance  Visit Diagnosis: Other lack of coordination  Cognitive social or emotional deficit following cerebral infarction    Problem List Patient Active Problem List   Diagnosis Date Noted  . Chronic hepatitis C without hepatic coma ([place]) 09/25/2017  . Diabetes mellitus type 2, uncomplicated ([place]) 54/56/2563  . Myocardial infarction ([place]) 09/25/2017  . Renal transplant recipient 09/25/2017  . Transfusion history 09/25/2017  . History of coronary artery disease 09/12/2016  . 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 MI 09/12/2016  . Aftercare following organ transplant 09/11/2016  . 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 At risk for infection transmitted from donor 06/16/2014  . Ureteral stent displacement ([place]) 06/10/2014  . Prophylactic antibiotic 06/09/2014  . Immunosuppression ([place]) 06/06/2014    Aziya Arena 02/28/2018, 2:00 PM  [facility] [address]  [place], [place], [address] Phone: [phone]   Fax:  [phone]  Name: Jonathan Cavenaugh Sr. MRN: [id number] Date of Birth: 01/03/1951

## 2018-02-28 NOTE — Therapy (Signed)
[facility] [address] [place], [place], [address] Phone: [phone]   Fax:  [phone]  Physical Therapy Treatment  Patient Details  Name: Jonathan Gayden Sr. MRN: [id number] Date of Birth: 1951-03-17 Referring Provider: Reather Laurence. Blanch Media   Encounter Date: 02/27/2018  PT End of Session - 02/28/18 0733    Visit Number  3    Number of Visits  17    Date for PT Re-Evaluation  05/14/18    Authorization Type  [employer] H[employer] Medicare Replacement-12 visits approved for PT; 10th visit progress note needed; [id number]    Authorization Time Period  02-12-18 through 04-12-18    Authorization - Visit Number  3    Authorization - Number of Visits  12    PT Start Time  1102    PT Stop Time  1142    PT Time Calculation (min)  40 min    Equipment Utilized During Treatment  Gait belt    Activity Tolerance  Patient tolerated treatment well    Behavior During Therapy  WFL for tasks assessed/performed       Past Medical History:  Diagnosis Date  . Diabetes mellitus without complication ([place])   . Hypertension   . Renal disorder 2015   right kidney transplant  . Stroke [facility])     Past Surgical History:  Procedure Laterality Date  . BACK SURGERY     Has had 2 back surgeries  . Depression    . HAND SURGERY Right   . HAND SURGERY    . KIDNEY TRANSPLANT     November 2015  . Memory loss    . NEPHRECTOMY TRANSPLANTED ORGAN      There were no vitals filed for this visit.  Subjective Assessment - 02/27/18 1102    Subjective  No changes since last PT visit.    Patient is accompained by:  Family member wife, [name]    Pertinent History  2015-kidney transplant; 2017-ministrokes; port of R side of chest; fistula on L upper arm (No BP in LUE); Independent prior to hospitalization, 3 weeks in hospital>[facility]>home    Patient Stated Goals  Pt's goal is to "learn how to walk again."  Wife's goal is to get stronger and get  balance better.    Currently in Pain?  No/denies                       [facility] Adult PT Treatment/Exercise - 02/28/18 0730      Transfers   Transfers  Sit to Stand;Stand to Sit    Sit to Stand  5: Supervision;With upper extremity assist;From chair/3-in-1    Stand to Sit  5: Supervision;With upper extremity assist;To chair/3-in-1      Ambulation/Gait   Ambulation/Gait  Yes    Ambulation/Gait Assistance  5: Supervision    Ambulation Distance (Feet)  110 Feet x 2, 200 +150 with turns around furniture    Assistive device  Rollator    Gait Pattern  Step-through pattern;Decreased hip/knee flexion - left;Decreased step length - left;Decreased stance time - left    Ambulation Surface  Level;Indoor    Gait Comments  Needs verbal cues each time with turning walker around to sit, to lock brakes and to reach back to sit.      Self-Care   Self-Care  Other Self-Care Comments    Other Self-Care Comments   Fall prevention education provided; again, reiterated to wife need to use rollator walker at all  times.  She does report that he is walking short distances upstairs without the rollator, as it does not go upstairs.      Exercises   Exercises  Knee/Hip;Ankle Review of HEP given last visit-pt return demo understanding      Knee/Hip Exercises: Standing   Knee Flexion  AROM;Right;Left;1 set;10 reps    Hip Flexion  AROM;Stengthening;Right;Left;1 set;10 reps;Knee bent Marching in place     Hip Abduction  AROM;Stengthening;Right;Left;1 set;10 reps    Hip Extension  AROM;Stengthening;Right;Left;1 set;10 reps    Other Standing Knee Exercises  Sidestepping along counter R and L 10 ft, x 2 reps with UE support    Other Standing Knee Exercises  Heel toe raises x 10 reps in standing at counter      Knee/Hip Exercises: Seated   Long Arc Quad  Strengthening;Right;Left;2 sets;10 reps    Marching  Strengthening;Right;Left;2 sets;10 reps    Hamstring Curl  Strengthening;Right;Left;2 sets;10 reps  red theraband    Sit to [employer]  2 sets;5 reps             PT Education - 02/28/18 0732    Education Details  Review of HEP, fall prevention education provided    Person(s) Educated  Patient;Spouse wife present at end of session    Methods  Explanation;Handout    Comprehension  Verbalized understanding       PT Short Term Goals - 02/13/18 1055      PT SHORT TERM GOAL #1   Title  Pt will perform HEP with family's supervision for improved balance, strength, and gait.  TARGET (after waiting 1 week for auth):03/16/18    Time  4    Period  Weeks    Status  New    Target Date  03/16/18      PT SHORT TERM GOAL #2   Title  Pt will improve BERG Balance score to at least 32/56 for decreased fall risk.    Time  4    Period  Weeks    Status  New    Target Date  03/16/18      PT SHORT TERM GOAL #3   Title  Pt will improve TUG to less than or equal to 22 seconds for decreased fall risk.    Time  4    Period  Weeks    Status  New    Target Date  03/16/18      PT SHORT TERM GOAL #4   Title  Pt will perform sit<>stand transfers, at least 8 of 10 reps with minimal to no UE support, for improved safety and efficiency with transfers.    Time  4    Period  Weeks    Status  New    Target Date  03/16/18      PT SHORT TERM GOAL #5   Title  Pt/wife will verbalize understanding of fall prevention in home environment.    Time  4    Period  Weeks    Status  New    Target Date  03/16/18        PT Long Term Goals - 02/13/18 1058      PT LONG TERM GOAL #1   Title  Pt will improve gait velocity to at least 2.3 ft/sec for improved gait efficiency and safety.  TARGET 04/13/18    Time  8    Period  Weeks    Status  New    Target Date  04/13/18  PT LONG TERM GOAL #2   Title  Pt will improve Berg Balance score to at least 40/56 for decreased fall risk.    Time  8    Period  Weeks    Status  New    Target Date  04/13/18      PT LONG TERM GOAL #3   Title  Pt will improve TUG score  to less than or equal to 15 seconds for decreased fall risk.    Time  8    Period  Weeks    Status  New    Target Date  04/13/18      PT LONG TERM GOAL #4   Title  Pt will ambulate at least 1000 ft, indoor and outdoor surfaces, modified independently, for improved outdoor and community gait.    Time  8    Period  Weeks    Status  New    Target Date  04/13/18      PT LONG TERM GOAL #5   Title  Pt will ambulate household distances, 50-100 ft using cane, with supervision, for improved independence with gait.    Time  8    Period  Weeks    Status  New    Target Date  04/13/18      Additional Long Term Goals   Additional Long Term Goals  Yes      PT LONG TERM GOAL #6   Title  Pt/wife will verbalize plans for continued community fitness upon d/c from PT.    Time  8    Period  Weeks    Status  New    Target Date  04/13/18            Plan - 02/28/18 0733    Clinical Impression Statement  Skilled PT session focused today on review of HEP from last visit, with pt return demo understanding.  Worked on additional standing exercises, with increased L and R knee recurvatum noted (neither patient nor wife can report if this is new or longstanding).  Provided gait training with rollator and fall prevention education.  Pt will continue to benefit from skilled PT to address strength, balance, and safety with gait.    Rehab Potential  Good    Clinical Impairments Affecting Rehab Potential  Cognitive communication deficit (wife present at eval, family supportive)    PT Frequency  2x / week    PT Duration  8 weeks plus eval    PT Treatment/Interventions  ADLs/Self Care Home Management;Gait training;Functional mobility training;Therapeutic activities;Therapeutic exercise;Balance training;DME Instruction;Neuromuscular re-education;Patient/family education;Orthotic Fit/Training    PT Next Visit Plan  Work on standing strengthening and standing balance to add to HEP as able; gait and transfer  training    Consulted and Agree with Plan of Care  Patient;Family member/caregiver    Family Member Consulted  wife present at end of session       Patient will benefit from skilled therapeutic intervention in order to improve the following deficits and impairments:  Abnormal gait, Decreased balance, Decreased knowledge of use of DME, Decreased mobility, Decreased safety awareness, Difficulty walking, Decreased strength  Visit Diagnosis: Muscle weakness (generalized)  Unsteadiness on feet  Other abnormalities of gait and mobility     Problem List Patient Active Problem List   Diagnosis Date Noted  . Chronic hepatitis C without hepatic coma ([place]) 09/25/2017  . Diabetes mellitus type 2, uncomplicated ([place]) 16/96/7893  . Myocardial infarction ([place]) 09/25/2017  . Renal transplant recipient 09/25/2017  .  Transfusion history 09/25/2017  . History of coronary artery disease 09/12/2016  . 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 MI 09/12/2016  . Aftercare following organ transplant 09/11/2016  . 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 At risk for infection transmitted from donor 06/16/2014  . Ureteral stent displacement ([place]) 06/10/2014  . Prophylactic antibiotic 06/09/2014  . Immunosuppression ([place]) 06/06/2014    Alfhild Partch W. 02/28/2018, 7:36 AM Frazier Butt., PT  [facility] [address] [place], [place], [address] Phone: [phone]   Fax:  [phone]  Name: Jonathan Bartoszek Sr. MRN: [id number] Date of Birth: 1950-10-29

## 2018-03-01 ENCOUNTER — Encounter: Payer: Self-pay | Admitting: Physical Therapy

## 2018-03-01 ENCOUNTER — Ambulatory Visit: Payer: Medicare Other | Admitting: Physical Therapy

## 2018-03-01 ENCOUNTER — Ambulatory Visit: Payer: Medicare Other | Admitting: Occupational Therapy

## 2018-03-01 DIAGNOSIS — R278 Other lack of coordination: Secondary | ICD-10-CM

## 2018-03-01 DIAGNOSIS — I69315 Cognitive social or emotional deficit following cerebral infarction: Secondary | ICD-10-CM

## 2018-03-01 DIAGNOSIS — R2689 Other abnormalities of gait and mobility: Secondary | ICD-10-CM

## 2018-03-01 DIAGNOSIS — M6281 Muscle weakness (generalized): Secondary | ICD-10-CM

## 2018-03-01 DIAGNOSIS — R2681 Unsteadiness on feet: Secondary | ICD-10-CM

## 2018-03-01 NOTE — Therapy (Signed)
[facility] [address] [place], [place], [address] Phone: [phone]   Fax:  [phone]  Physical Therapy Treatment  Patient Details  Name: Jonathan Pellow Sr. MRN: [id number] Date of Birth: 1950/09/28 Referring Provider: Reather Laurence. Blanch Media   Encounter Date: 03/01/2018  PT End of Session - 03/01/18 1256    Visit Number  4    Number of Visits  17    Date for PT Re-Evaluation  05/14/18    Authorization Type  [employer] HRT Medicare Replacement-12 visits approved for PT; 10th visit progress note needed; [id number]    Authorization Time Period  02-12-18 through 04-12-18    Authorization - Visit Number  4    Authorization - Number of Visits  12    PT Start Time  3762    PT Stop Time  1143    PT Time Calculation (min)  40 min    Activity Tolerance  Patient tolerated treatment well    Behavior During Therapy  [facility] for tasks assessed/performed       Past Medical History:  Diagnosis Date  . Diabetes mellitus without complication ([place])   . Hypertension   . Renal disorder 2015   right kidney transplant  . Stroke [facility])     Past Surgical History:  Procedure Laterality Date  . BACK SURGERY     Has had 2 back surgeries  . Depression    . HAND SURGERY Right   . HAND SURGERY    . KIDNEY TRANSPLANT     November 2015  . Memory loss    . NEPHRECTOMY TRANSPLANTED ORGAN      There were no vitals filed for this visit.  Subjective Assessment - 03/01/18 1104    Subjective  No changes.  Did the exercise with the bands with my grandson.    Patient is accompained by:  Family member wife, [name]    Pertinent History  2015-kidney transplant; 2017-ministrokes; port of R side of chest; fistula on L upper arm (No BP in LUE); Independent prior to hospitalization, 3 weeks in hospital>[facility]>home    Patient Stated Goals  Pt's goal is to "learn how to walk again."  Wife's goal is to get stronger and get balance better.    Currently in Pain?  No/denies                       [facility] Adult PT Treatment/Exercise - 03/01/18 0001      Transfers   Transfers  Sit to Stand;Stand to Sit    Sit to Stand  5: Supervision;With upper extremity assist;From chair/3-in-1;From bed    Stand to Sit  5: Supervision;With upper extremity assist;To chair/3-in-1;To bed      Ambulation/Gait   Ambulation/Gait  Yes    Ambulation/Gait Assistance  5: Supervision    Ambulation Distance (Feet)  345 Feet 115 x 2    Assistive device  Rollator    Gait Pattern  Step-through pattern;Decreased hip/knee flexion - left;Decreased step length - left;Decreased stance time - left    Ambulation Surface  Level;Indoor      Exercises   Exercises  Knee/Hip;Ankle      Knee/Hip Exercises: Standing   Knee Flexion  AROM;Right;Left;1 set;10 reps    Hip Flexion  AROM;Stengthening;Right;Left;10 reps;Knee bent;2 sets Marching in place    Hip Abduction  AROM;Stengthening;Right;Left;1 set;10 reps    Hip Extension  AROM;Stengthening;Right;Left;1 set;10 reps    Other Standing Knee Exercises  Sidestepping along counter R  and L 10 ft, x 2 reps with UE support    Other Standing Knee Exercises  Heel toe raises x 10 reps in standing at counter      Knee/Hip Exercises: Seated   Sit to Sand  2 sets;5 reps      Ankle Exercises: Seated   Heel Raises  Both;10 reps    Toe Raise  10 reps       Pt requires several seated rest breaks during PT session-utilized time for ankle exercises and verbal review of seated HEP.   Balance Exercises - 03/01/18 1123      Balance Exercises: Standing   Tandem Stance  Eyes open;Upper extremity support 2;3 reps;10 secs          PT Short Term Goals - 02/13/18 1055      PT SHORT TERM GOAL #1   Title  Pt will perform HEP with family's supervision for improved balance, strength, and gait.  TARGET (after waiting 1 week for auth):03/16/18    Time  4    Period  Weeks    Status  New    Target Date  03/16/18       PT SHORT TERM GOAL #2   Title  Pt will improve BERG Balance score to at least 32/56 for decreased fall risk.    Time  4    Period  Weeks    Status  New    Target Date  03/16/18      PT SHORT TERM GOAL #3   Title  Pt will improve TUG to less than or equal to 22 seconds for decreased fall risk.    Time  4    Period  Weeks    Status  New    Target Date  03/16/18      PT SHORT TERM GOAL #4   Title  Pt will perform sit<>stand transfers, at least 8 of 10 reps with minimal to no UE support, for improved safety and efficiency with transfers.    Time  4    Period  Weeks    Status  New    Target Date  03/16/18      PT SHORT TERM GOAL #5   Title  Pt/wife will verbalize understanding of fall prevention in home environment.    Time  4    Period  Weeks    Status  New    Target Date  03/16/18        PT Long Term Goals - 02/13/18 1058      PT LONG TERM GOAL #1   Title  Pt will improve gait velocity to at least 2.3 ft/sec for improved gait efficiency and safety.  TARGET 04/13/18    Time  8    Period  Weeks    Status  New    Target Date  04/13/18      PT LONG TERM GOAL #2   Title  Pt will improve Berg Balance score to at least 40/56 for decreased fall risk.    Time  8    Period  Weeks    Status  New    Target Date  04/13/18      PT LONG TERM GOAL #3   Title  Pt will improve TUG score to less than or equal to 15 seconds for decreased fall risk.    Time  8    Period  Weeks    Status  New    Target Date  04/13/18  PT LONG TERM GOAL #4   Title  Pt will ambulate at least 1000 ft, indoor and outdoor surfaces, modified independently, for improved outdoor and community gait.    Time  8    Period  Weeks    Status  New    Target Date  04/13/18      PT LONG TERM GOAL #5   Title  Pt will ambulate household distances, 50-100 ft using cane, with supervision, for improved independence with gait.    Time  8    Period  Weeks    Status  New    Target Date  04/13/18      Additional  Long Term Goals   Additional Long Term Goals  Yes      PT LONG TERM GOAL #6   Title  Pt/wife will verbalize plans for continued community fitness upon d/c from PT.    Time  8    Period  Weeks    Status  New    Target Date  04/13/18            Plan - 03/01/18 1257    Clinical Impression Statement  Skilled PT session focused on standing strengthening and balance activities and gait training with rollator.  Pt needs multiple brief rest breaks during PT session today.  Pt continues to have recurvatum, LLE greater than RLE, with pt reporting today, "it's always been that way"      Rehab Potential  Good    Clinical Impairments Affecting Rehab Potential  Cognitive communication deficit (wife present at eval, family supportive)    PT Frequency  2x / week    PT Duration  8 weeks plus eval    PT Treatment/Interventions  ADLs/Self Care Home Management;Gait training;Functional mobility training;Therapeutic activities;Therapeutic exercise;Balance training;DME Instruction;Neuromuscular re-education;Patient/family education;Orthotic Fit/Training    PT Next Visit Plan  Work on standing strengthening and standing balance to add to HEP as able; walking program for home; gait and transfer training; ?AFO to address recurvatum    Consulted and Agree with Plan of Care  Patient    Family Member Consulted          Patient will benefit from skilled therapeutic intervention in order to improve the following deficits and impairments:  Abnormal gait, Decreased balance, Decreased knowledge of use of DME, Decreased mobility, Decreased safety awareness, Difficulty walking, Decreased strength  Visit Diagnosis: Muscle weakness (generalized)  Other abnormalities of gait and mobility  Unsteadiness on feet     Problem List Patient Active Problem List   Diagnosis Date Noted  . Chronic hepatitis C without hepatic coma ([place]) 09/25/2017  . Diabetes mellitus type 2, uncomplicated ([place]) 66/59/9357  . Myocardial  infarction ([place]) 09/25/2017  . Renal transplant recipient 09/25/2017  . Transfusion history 09/25/2017  . History of coronary artery disease 09/12/2016  . 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 MI 09/12/2016  . Aftercare following organ transplant 09/11/2016  . 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 At risk for infection transmitted from donor 06/16/2014  . Ureteral stent displacement ([place]) 06/10/2014  . Prophylactic antibiotic 06/09/2014  . Immunosuppression ([place]) 06/06/2014    Leib Elahi W. 03/01/2018, 1:00 PM Frazier Butt., PT  [facility] [address] [place], [place], [address] Phone: [phone]   Fax:  [phone]  Name: Jonathan Podolak Sr. MRN: [id number] Date of Birth: 1951/07/03

## 2018-03-01 NOTE — Therapy (Signed)
[facility] [address] [place], [place], [address] Phone: [phone]   Fax:  [phone]  Occupational Therapy Treatment  Patient Details  Name: Jonathan Wishart Sr. MRN: [id number] Date of Birth: 1951-05-24 Referring Provider: Reather Laurence. Blanch Media   Encounter Date: 03/01/2018  OT End of Session - 03/01/18 1020    Visit Number  4    Number of Visits  25    Date for OT Re-Evaluation  05/14/18    Authorization Type  [employer] HRT    Authorization Time Period  12 visits approved through 04/12/18    Authorization - Visit Number  4    Authorization - Number of Visits  12    OT Start Time  1017    OT Stop Time  1058    OT Time Calculation (min)  41 min    Behavior During Therapy  WFL for tasks assessed/performed       Past Medical History:  Diagnosis Date  . Diabetes mellitus without complication ([place])   . Hypertension   . Renal disorder 2015   right kidney transplant  . Stroke [facility])     Past Surgical History:  Procedure Laterality Date  . BACK SURGERY     Has had 2 back surgeries  . Depression    . HAND SURGERY Right   . HAND SURGERY    . KIDNEY TRANSPLANT     November 2015  . Memory loss    . NEPHRECTOMY TRANSPLANTED ORGAN      There were no vitals filed for this visit.  Subjective Assessment - 03/01/18 1019    Pertinent History  2015-kidney transplant; 2017-ministrokes; port of R side of chest; fistula on L upper arm (No BP in LUE); ESRD, CVA, Hep C per hx    Patient Stated Goals  regain use of LUE    Currently in Pain?  No/denies             Treatment: Standing to copy small peg design, mod v.c for correct design with LUE, min-mod difficulty due to coordination, supervision for balance. (Increased time required due to cognitive deficits and decreased coordination) Seated closed chain shoulder flexion then diagonals each direction 10-20 reps each, min v.c, pt requied rest break between each set. Pt  tied his shoes independently today, he was encouraged to fully dress himself with supervision at home.               OT Short Term Goals - 02/13/18 1249      OT SHORT TERM GOAL #1   Title  Pt/ caregiver will be I with inital HEP.  due 03/15/18    Time  4    Period  Weeks    Status  New    Target Date  03/15/18      OT SHORT TERM GOAL #2   Title  Pt will perform UB dressing with setup/ supervision, LB dressing with min A.    Time  4    Period  Weeks    Status  New      OT SHORT TERM GOAL #3   Title  Pt will use LUE as a non-dominant assist at least 25 % of the time for ADLs/ IADLS.    Baseline  currently pt does not use LUE functionally    Time  4    Period  Weeks    Status  New      OT SHORT TERM GOAL #4   Title  Pt will demonstrate improved LUE fine motor coordination as evidenced by performing 9 hole peg test in 90 secs or less.    Baseline  RUE 49.59 secs, LUE  1 min 51 secs    Time  4    Period  Weeks    Status  New      OT SHORT TERM GOAL #5   Title  Pt / family will verbalize understanding of LUE postioning to minimize pain, edema and risk for injury.    Baseline  moderate edema in left hand    Time  4    Period  Weeks    Status  New      Additional Short Term Goals   Additional Short Term Goals  Yes      OT SHORT TERM GOAL #6   Title  Pt/ caregiver will verbalize understanding of compensatory strategies for short term memory and cognitive deficits.    Time  4    Period  Weeks    Status  New      OT SHORT TERM GOAL #7   Title  Pt will perform a simple task requiring organization and problem solving with 75% accuracy.    Time  4    Period  Weeks    Status  New      OT SHORT TERM GOAL #8   Title  Further assess cognition prn and add additional goals as needed    Time  4    Period  Weeks    Status  New        OT Long Term Goals - 02/13/18 1254      OT LONG TERM GOAL #1   Title  Pt/ caregiver will be I with updated HEP.    Time  12     Period  Weeks    Status  New    Target Date  05/14/18      OT LONG TERM GOAL #2   Title  Pt will perform all basic ADLS modified independently.    Time  12    Period  Weeks    Status  New      OT LONG TERM GOAL #3   Title  Pt will perform simple snack or beverage prep with supervision .    Time  12    Period  Weeks    Status  New      OT LONG TERM GOAL #4   Title  Pt will use LUE as a non-dominant assist at least 50% of the time for ADLs/ IADLs.    Time  12    Period  Weeks    Status  New      OT LONG TERM GOAL #5   Title  Pt will demonstrate improved LUE fine motor coordination for ADLS as evidenced by performing 9-hole peg test in 60 secs or less.    Time  12    Period  Weeks    Status  New      OT LONG TERM GOAL #6   Title  Pt will perfrom simple home management tasks with supervision demonstrating good safety awareness.    Time  12    Period  Weeks    Status  New            Plan - 03/01/18 1056    Clinical Impression Statement  Pt is progressing towards goals. He continues to demonstrate decreased fine motor coordination requiring increased time. Pt reports he continues to  fatigue quicly with exercise.    Rehab Potential  Good    OT Frequency  2x / week    OT Duration  12 weeks    OT Treatment/Interventions  Self-care/ADL training;Therapeutic exercise;Patient/family education;Splinting;Neuromuscular education;Balance training;Therapeutic activities;Functional Mobility Training;Energy conservation;Fluidtherapy;Ultrasound;Cryotherapy;DME and/or AE instruction;Manual Therapy;Passive range of motion;Cognitive remediation/compensation;Visual/perceptual remediation/compensation;Moist Heat;Paraffin;Aquatic Therapy    Plan   putty HEP if not hand pain, continue to addess functional use of LUE and cognition    Consulted and Agree with Plan of Care  Patient       Patient will benefit from skilled therapeutic intervention in order to improve the following deficits and  impairments:  Abnormal gait, Decreased cognition, Decreased knowledge of use of DME, Increased edema, Pain, Impaired flexibility, Impaired sensation, Decreased coordination, Decreased mobility, Decreased activity tolerance, Decreased endurance, Decreased range of motion, Decreased strength, Impaired UE functional use, Impaired perceived functional ability, Difficulty walking, Decreased safety awareness, Decreased knowledge of precautions, Decreased balance  Visit Diagnosis: Other lack of coordination  Cognitive social or emotional deficit following cerebral infarction  Muscle weakness (generalized)    Problem List Patient Active Problem List   Diagnosis Date Noted  . Chronic hepatitis C without hepatic coma ([place]) 09/25/2017  . Diabetes mellitus type 2, uncomplicated ([place]) 24/58/0998  . Myocardial infarction ([place]) 09/25/2017  . Renal transplant recipient 09/25/2017  . Transfusion history 09/25/2017  . History of coronary artery disease 09/12/2016  . 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 MI 09/12/2016  . Aftercare following organ transplant 09/11/2016  . 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 At risk for infection transmitted from donor 06/16/2014  . Ureteral stent displacement ([place]) 06/10/2014  . Prophylactic antibiotic 06/09/2014  . Immunosuppression ([place]) 06/06/2014    Sunya Humbarger 03/01/2018, 4:39 PM  [facility] [address] [place], [place], [address] Phone: [phone]   Fax:  [phone]  Name: Jonathan Sitar Sr. MRN: [id number] Date of Birth: 1950/12/27

## 2018-03-06 ENCOUNTER — Ambulatory Visit: Payer: Medicare Other | Admitting: Occupational Therapy

## 2018-03-06 ENCOUNTER — Ambulatory Visit: Payer: Medicare Other | Admitting: Physical Therapy

## 2018-03-06 ENCOUNTER — Encounter: Payer: Self-pay | Admitting: Physical Therapy

## 2018-03-06 DIAGNOSIS — R2689 Other abnormalities of gait and mobility: Secondary | ICD-10-CM | POA: Diagnosis not present

## 2018-03-06 DIAGNOSIS — R278 Other lack of coordination: Secondary | ICD-10-CM

## 2018-03-06 DIAGNOSIS — M6281 Muscle weakness (generalized): Secondary | ICD-10-CM

## 2018-03-06 DIAGNOSIS — I69315 Cognitive social or emotional deficit following cerebral infarction: Secondary | ICD-10-CM

## 2018-03-06 NOTE — Therapy (Signed)
[facility] [address] [place], [place], [address] Phone: [phone]   Fax:  [phone]  Physical Therapy Treatment  Patient Details  Name: Jonathan Roback Sr. MRN: [id number] Date of Birth: 1950-10-30 Referring Provider: Reather Laurence. Blanch Media   Encounter Date: 03/06/2018  PT End of Session - 03/06/18 1736    Visit Number  5    Number of Visits  17    Date for PT Re-Evaluation  05/14/18    Authorization Type  [employer] HRT Medicare Replacement-12 visits approved for PT; 10th visit progress note needed; [id number]    Authorization Time Period  02-12-18 through 04-12-18    Authorization - Visit Number  5    Authorization - Number of Visits  12    PT Start Time  9371    PT Stop Time  6967    PT Time Calculation (min)  44 min    Activity Tolerance  Patient tolerated treatment well    Behavior During Therapy  [facility] for tasks assessed/performed       Past Medical History:  Diagnosis Date  . Diabetes mellitus without complication ([place])   . Hypertension   . Renal disorder 2015   right kidney transplant  . Stroke [facility])     Past Surgical History:  Procedure Laterality Date  . BACK SURGERY     Has had 2 back surgeries  . Depression    . HAND SURGERY Right   . HAND SURGERY    . KIDNEY TRANSPLANT     November 2015  . Memory loss    . NEPHRECTOMY TRANSPLANTED ORGAN      There were no vitals filed for this visit.  Subjective Assessment - 03/06/18 1318    Subjective  No changes.  No pain today.  No falls.    Patient is accompained by:  Family member wife, [name]    Pertinent History  2015-kidney transplant; 2017-ministrokes; port of R side of chest; fistula on L upper arm (No BP in LUE); Independent prior to hospitalization, 3 weeks in hospital>[facility]>home    Patient Stated Goals  Pt's goal is to "learn how to walk again."  Wife's goal is to get stronger and get balance better.    Currently in Pain?   No/denies                       [facility] Adult PT Treatment/Exercise - 03/06/18 1319      Ambulation/Gait   Ambulation/Gait  Yes    Ambulation/Gait Assistance  5: Supervision    Ambulation/Gait Assistance Details  Gait with environmental scanning x 200 ft using rollator walker-pt tends to slow significantly or stop to look around when PT provides environmental scanning cues.    Ambulation Distance (Feet)  100 Feet x 2, 200    Assistive device  Rollator    Gait Pattern  Step-through pattern;Decreased hip/knee flexion - left;Decreased step length - left;Decreased stance time - left    Ambulation Surface  Level;Unlevel      Knee/Hip Exercises: Aerobic   Stepper  SciFit Seated Stepper, Level 1.5, 4 extremities x 10 minutes for leg strengthening      Knee/Hip Exercises: Standing   Knee Flexion  AROM;Right;Left;1 set;10 reps    Hip Flexion  AROM;Stengthening;Right;Left;10 reps;Knee bent;1 set    Hip Abduction  AROM;Stengthening;Right;Left;1 set;10 reps    Hip Extension  AROM;Stengthening;Right;Left;1 set;10 reps    Other Standing Knee Exercises  Sidestepping along counter R and L  10 ft, x 2 reps with UE support    Other Standing Knee Exercises  Heel toe raises x 10 reps in standing at counter       Sit to stand x 5 reps, minimal UE support, for functional lower extremity strengthening.   Balance Exercises - 03/06/18 1732      Balance Exercises: Standing   Tandem Stance  Eyes open;Upper extremity support 2;3 reps;10 secs        PT Education - 03/06/18 1735    Education Details  Additions to HEP-see instructions    Person(s) Educated  Patient spouse not in lobby at end of PT session for instruction    Methods  Explanation;Demonstration;Handout    Comprehension  Verbalized understanding;Returned demonstration;Verbal cues required       PT Short Term Goals - 02/13/18 1055      PT SHORT TERM GOAL #1   Title  Pt will perform HEP with family's supervision for improved  balance, strength, and gait.  TARGET (after waiting 1 week for auth):03/16/18    Time  4    Period  Weeks    Status  New    Target Date  03/16/18      PT SHORT TERM GOAL #2   Title  Pt will improve BERG Balance score to at least 32/56 for decreased fall risk.    Time  4    Period  Weeks    Status  New    Target Date  03/16/18      PT SHORT TERM GOAL #3   Title  Pt will improve TUG to less than or equal to 22 seconds for decreased fall risk.    Time  4    Period  Weeks    Status  New    Target Date  03/16/18      PT SHORT TERM GOAL #4   Title  Pt will perform sit<>stand transfers, at least 8 of 10 reps with minimal to no UE support, for improved safety and efficiency with transfers.    Time  4    Period  Weeks    Status  New    Target Date  03/16/18      PT SHORT TERM GOAL #5   Title  Pt/wife will verbalize understanding of fall prevention in home environment.    Time  4    Period  Weeks    Status  New    Target Date  03/16/18        PT Long Term Goals - 02/13/18 1058      PT LONG TERM GOAL #1   Title  Pt will improve gait velocity to at least 2.3 ft/sec for improved gait efficiency and safety.  TARGET 04/13/18    Time  8    Period  Weeks    Status  New    Target Date  04/13/18      PT LONG TERM GOAL #2   Title  Pt will improve Berg Balance score to at least 40/56 for decreased fall risk.    Time  8    Period  Weeks    Status  New    Target Date  04/13/18      PT LONG TERM GOAL #3   Title  Pt will improve TUG score to less than or equal to 15 seconds for decreased fall risk.    Time  8    Period  Weeks    Status  New  Target Date  04/13/18      PT LONG TERM GOAL #4   Title  Pt will ambulate at least 1000 ft, indoor and outdoor surfaces, modified independently, for improved outdoor and community gait.    Time  8    Period  Weeks    Status  New    Target Date  04/13/18      PT LONG TERM GOAL #5   Title  Pt will ambulate household distances, 50-100 ft  using cane, with supervision, for improved independence with gait.    Time  8    Period  Weeks    Status  New    Target Date  04/13/18      Additional Long Term Goals   Additional Long Term Goals  Yes      PT LONG TERM GOAL #6   Title  Pt/wife will verbalize plans for continued community fitness upon d/c from PT.    Time  8    Period  Weeks    Status  New    Target Date  04/13/18            Plan - 03/06/18 1736    Clinical Impression Statement  Additions provided to patient's current HEP, for standing exercises.  Discussed how to alternate standing exercises (on non-dialysis days) and seated exercises (on dialysis days) for optimal HEP performance.  Pt utilized seated stepper for lower extremitiy stregnthening x 10 minutes without c/o, but he does report he does not have a recumbent bike at home (as transplant MD recommended he use in the last MD note).  Pt will conitnue to benefit from skilled PT to address balance and gait and strengthening.    Rehab Potential  Good    Clinical Impairments Affecting Rehab Potential  Cognitive communication deficit (wife present at eval, family supportive)    PT Frequency  2x / week    PT Duration  8 weeks plus eval    PT Treatment/Interventions  ADLs/Self Care Home Management;Gait training;Functional mobility training;Therapeutic activities;Therapeutic exercise;Balance training;DME Instruction;Neuromuscular re-education;Patient/family education;Orthotic Fit/Training    PT Next Visit Plan  Review standing HEP, discuss/formulate walking plan for home; use of seated machines with education for community fitness transition at some point    Consulted and Agree with Plan of Care  Patient    Family Member Consulted          Patient will benefit from skilled therapeutic intervention in order to improve the following deficits and impairments:  Abnormal gait, Decreased balance, Decreased knowledge of use of DME, Decreased mobility, Decreased safety  awareness, Difficulty walking, Decreased strength  Visit Diagnosis: Muscle weakness (generalized)  Other abnormalities of gait and mobility     Problem List Patient Active Problem List   Diagnosis Date Noted  . Chronic hepatitis C without hepatic coma ([place]) 09/25/2017  . Diabetes mellitus type 2, uncomplicated ([place]) 67/61/9509  . Myocardial infarction ([place]) 09/25/2017  . Renal transplant recipient 09/25/2017  . Transfusion history 09/25/2017  . History of coronary artery disease 09/12/2016  . 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 MI 09/12/2016  . Aftercare following organ transplant 09/11/2016  . 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 At risk for infection transmitted from donor 06/16/2014  . Ureteral stent displacement ([place]) 06/10/2014  . Prophylactic antibiotic 06/09/2014  . Immunosuppression ([place]) 06/06/2014    Avalyn Molino W. 03/06/2018, 5:41 PM  Frazier Butt., PT   [facility] [address]  [place], [place], [address] Phone: [phone]   Fax:  [phone]  Name: Johnattan Strassman Sr. MRN: [id number] Date of Birth: [birth year]

## 2018-03-06 NOTE — Patient Instructions (Signed)
1. Grip Strengthening (Resistive Putty)   Squeeze putty using thumb and all fingers. Repeat _20___ times. Do __2__ sessions per day.   2. Roll putty into tube on table and pinch between each finger and thumb x 10 reps each. (can do ring and small finger together)     Copyright  VHI. All rights reserved.   

## 2018-03-06 NOTE — Therapy (Signed)
[facility] [address] [place], [place], [address] Phone: [phone]   Fax:  [phone]  Occupational Therapy Treatment  Patient Details  Name: Jonathan Karner Sr. MRN: [id number] Date of Birth: Sep 30, 1950 Referring Provider: Reather Laurence. Blanch Media   Encounter Date: 03/06/2018  OT End of Session - 03/06/18 1731    Visit Number  5    Number of Visits  25    Date for OT Re-Evaluation  05/14/18    Authorization Type  BCBS HRT    Authorization Time Period  12 visits approved through 04/12/18    Authorization - Visit Number  5    Authorization - Number of Visits  12    OT Start Time  3810    OT Stop Time  1445    OT Time Calculation (min)  40 min    Behavior During Therapy  [facility] for tasks assessed/performed       Past Medical History:  Diagnosis Date  . Diabetes mellitus without complication ([place])   . Hypertension   . Renal disorder 2015   right kidney transplant  . Stroke [facility])     Past Surgical History:  Procedure Laterality Date  . BACK SURGERY     Has had 2 back surgeries  . Depression    . HAND SURGERY Right   . HAND SURGERY    . KIDNEY TRANSPLANT     November 2015  . Memory loss    . NEPHRECTOMY TRANSPLANTED ORGAN      There were no vitals filed for this visit.  Subjective Assessment - 03/06/18 1731    Subjective   Denies falls since last visit    Patient Stated Goals  regain use of LUE    Currently in Pain?  No/denies             Treatment: completing  12 piece puzzle for using bilateral UE's for problem solving attention and visual perceptual skills, mod -max v.c. And increased time required. Standing to perform overhead reaching to place remove items from mid level to high shelf, no LOB and improved functional use of LUE.               OT Education - 03/06/18 1733    Education Details  red putty HEP    Person(s) Educated  Patient    Methods   Explanation;Demonstration;Verbal cues;Handout    Comprehension  Verbalized understanding;Returned demonstration;Verbal cues required       OT Short Term Goals - 02/13/18 1249      OT SHORT TERM GOAL #1   Title  Pt/ caregiver will be I with inital HEP.  due 03/15/18    Time  4    Period  Weeks    Status  New    Target Date  03/15/18      OT SHORT TERM GOAL #2   Title  Pt will perform UB dressing with setup/ supervision, LB dressing with min A.    Time  4    Period  Weeks    Status  New      OT SHORT TERM GOAL #3   Title  Pt will use LUE as a non-dominant assist at least 25 % of the time for ADLs/ IADLS.    Baseline  currently pt does not use LUE functionally    Time  4    Period  Weeks    Status  New      OT SHORT TERM GOAL #4  Title  Pt will demonstrate improved LUE fine motor coordination as evidenced by performing 9 hole peg test in 90 secs or less.    Baseline  RUE 49.59 secs, LUE  1 min 51 secs    Time  4    Period  Weeks    Status  New      OT SHORT TERM GOAL #5   Title  Pt / family will verbalize understanding of LUE postioning to minimize pain, edema and risk for injury.    Baseline  moderate edema in left hand    Time  4    Period  Weeks    Status  New      Additional Short Term Goals   Additional Short Term Goals  Yes      OT SHORT TERM GOAL #6   Title  Pt/ caregiver will verbalize understanding of compensatory strategies for short term memory and cognitive deficits.    Time  4    Period  Weeks    Status  New      OT SHORT TERM GOAL #7   Title  Pt will perform a simple task requiring organization and problem solving with 75% accuracy.    Time  4    Period  Weeks    Status  New      OT SHORT TERM GOAL #8   Title  Further assess cognition prn and add additional goals as needed    Time  4    Period  Weeks    Status  New        OT Long Term Goals - 02/13/18 1254      OT LONG TERM GOAL #1   Title  Pt/ caregiver will be I with updated HEP.     Time  12    Period  Weeks    Status  New    Target Date  05/14/18      OT LONG TERM GOAL #2   Title  Pt will perform all basic ADLS modified independently.    Time  12    Period  Weeks    Status  New      OT LONG TERM GOAL #3   Title  Pt will perform simple snack or beverage prep with supervision .    Time  12    Period  Weeks    Status  New      OT LONG TERM GOAL #4   Title  Pt will use LUE as a non-dominant assist at least 50% of the time for ADLs/ IADLs.    Time  12    Period  Weeks    Status  New      OT LONG TERM GOAL #5   Title  Pt will demonstrate improved LUE fine motor coordination for ADLS as evidenced by performing 9-hole peg test in 60 secs or less.    Time  12    Period  Weeks    Status  New      OT LONG TERM GOAL #6   Title  Pt will perfrom simple home management tasks with supervision demonstrating good safety awareness.    Time  12    Period  Weeks    Status  New            Plan - 03/06/18 1732    Clinical Impression Statement  Pt is progressing towards goals. He demonstrates improving LUE functional use, and reports tying his shoes today.  Rehab Potential  Good    Current Impairments/barriers affecting progress:  cognitive deficits, dialysis, complex medical hx    OT Frequency  2x / week    OT Duration  12 weeks    OT Treatment/Interventions  Self-care/ADL training;Therapeutic exercise;Patient/family education;Splinting;Neuromuscular education;Balance training;Therapeutic activities;Functional Mobility Training;Energy conservation;Fluidtherapy;Ultrasound;Cryotherapy;DME and/or AE instruction;Manual Therapy;Passive range of motion;Cognitive remediation/compensation;Visual/perceptual remediation/compensation;Moist Heat;Paraffin;Aquatic Therapy    Plan  check on putty HEP    Consulted and Agree with Plan of Care  Patient       Patient will benefit from skilled therapeutic intervention in order to improve the following deficits and impairments:   Abnormal gait, Decreased cognition, Decreased knowledge of use of DME, Increased edema, Pain, Impaired flexibility, Impaired sensation, Decreased coordination, Decreased mobility, Decreased activity tolerance, Decreased endurance, Decreased range of motion, Decreased strength, Impaired UE functional use, Impaired perceived functional ability, Difficulty walking, Decreased safety awareness, Decreased knowledge of precautions, Decreased balance  Visit Diagnosis: Other lack of coordination  Cognitive social or emotional deficit following cerebral infarction  Muscle weakness (generalized)    Problem List Patient Active Problem List   Diagnosis Date Noted  . Chronic hepatitis C without hepatic coma ([place]) 09/25/2017  . Diabetes mellitus type 2, uncomplicated ([place]) 04/88/8916  . Myocardial infarction ([place]) 09/25/2017  . Renal transplant recipient 09/25/2017  . Transfusion history 09/25/2017  . History of coronary artery disease 09/12/2016  . 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 MI 09/12/2016  . Aftercare following organ transplant 09/11/2016  . 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 At risk for infection transmitted from donor 06/16/2014  . Ureteral stent displacement ([place]) 06/10/2014  . Prophylactic antibiotic 06/09/2014  . Immunosuppression ([place]) 06/06/2014    Jameire Kouba 03/06/2018, 5:33 PM  [facility] [address] [place], [place], [address] Phone: [phone]   Fax:  [phone]  Name: Jonathan Mclinden Sr. MRN: [id number] Date of Birth: 05/10/1951

## 2018-03-06 NOTE — Patient Instructions (Signed)
Access Code: [email or web address]  URL: [email or web address]  Date: 03/06/2018  Prepared by: Mady Haagensen   Exercises  Standing Marching - 10 reps - 2 sets - 1x daily - 3x weekly  Standing Hip Abduction with Counter Support - 10 reps - 2 sets - 1x daily - 3x weekly  Standing Hip Extension with Counter Support - 10 reps - 2 sets - 1x daily - 3x weekly  Standing Knee Flexion with Counter Support - 10 reps - 2 sets - 1x daily - 3x weekly  Side Stepping with Counter Support - 3 reps - 1x daily - 3x weekly    Try to do these on non-dialysis days.  Try to do your sitting exercises on dialysis nights.

## 2018-03-08 ENCOUNTER — Encounter: Payer: Self-pay | Admitting: Physical Therapy

## 2018-03-08 ENCOUNTER — Ambulatory Visit: Payer: Medicare Other | Attending: Registered Nurse | Admitting: Physical Therapy

## 2018-03-08 ENCOUNTER — Ambulatory Visit: Payer: Medicare Other | Admitting: Occupational Therapy

## 2018-03-08 DIAGNOSIS — I69315 Cognitive social or emotional deficit following cerebral infarction: Secondary | ICD-10-CM

## 2018-03-08 DIAGNOSIS — R2681 Unsteadiness on feet: Secondary | ICD-10-CM | POA: Diagnosis present

## 2018-03-08 DIAGNOSIS — M6281 Muscle weakness (generalized): Secondary | ICD-10-CM | POA: Diagnosis not present

## 2018-03-08 DIAGNOSIS — R278 Other lack of coordination: Secondary | ICD-10-CM | POA: Diagnosis present

## 2018-03-08 DIAGNOSIS — R2689 Other abnormalities of gait and mobility: Secondary | ICD-10-CM | POA: Diagnosis present

## 2018-03-08 NOTE — Patient Instructions (Signed)
WALKING  Walking is a great form of exercise to increase your strength, endurance and overall fitness.  A walking program can help you start slowly and gradually build endurance as you go.  Everyone's ability is different, so each person's starting point will be different.  You do not have to follow them exactly.  The are just samples. You should simply find out what's right for you and stick to that program.   In the beginning, you'll start off walking 2-3 times a day for short distances.  As you get stronger, you'll be walking further at just 1-2 times per day.  A. You Can Walk For A Certain Length Of Time Each Day    Walk 5-10  minutes 1-2 times per day, 3 times per week.  Increase 2 minutes every  week (3 times per week).  Work up to 15-20 minutes (3 times per week).

## 2018-03-08 NOTE — Therapy (Signed)
[facility] [address] [place], [place], [address] Phone: [phone]   Fax:  [phone]  Physical Therapy Treatment  Patient Details  Name: Jonathan [name] Sr. MRN: [id number] Date of Birth: [birth year] Referring Provider: Reather Laurence. Blanch Media   Encounter Date: 03/08/2018  PT End of Session - 03/08/18 1319    Visit Number  6    Number of Visits  17    Date for PT Re-Evaluation  05/14/18    Authorization Type  [employer] HRT Medicare Replacement-12 visits approved for PT; 10th visit progress note needed; [id number]    Authorization Time Period  02-12-18 through 04-12-18    Authorization - Visit Number  6    Authorization - Number of Visits  12    PT Start Time  [phone]    PT Stop Time  1013    PT Time Calculation (min)  39 min    Activity Tolerance  Patient tolerated treatment well    Behavior During Therapy  [facility] for tasks assessed/performed       Past Medical History:  Diagnosis Date  . Diabetes mellitus without complication ([place])   . Hypertension   . Renal disorder 2015   right kidney transplant  . Stroke [facility])     Past Surgical History:  Procedure Laterality Date  . BACK SURGERY     Has had 2 back surgeries  . Depression    . HAND SURGERY Right   . HAND SURGERY    . KIDNEY TRANSPLANT     November 2015  . Memory loss    . NEPHRECTOMY TRANSPLANTED ORGAN      There were no vitals filed for this visit.  Subjective Assessment - 03/08/18 0935    Subjective  No changes, no falls.    Patient is accompained by:  Family member wife, [name]    Pertinent History  2015-kidney transplant; 2017-ministrokes; port of R side of chest; fistula on L upper arm (No BP in LUE); Independent prior to hospitalization, 3 weeks in hospital>[facility]>home    Patient Stated Goals  Pt's goal is to "learn how to walk again."  Wife's goal is to get stronger and get balance better.    Currently in Pain?  No/denies                        [facility] Adult PT Treatment/Exercise - 03/08/18 0001      Ambulation/Gait   Ambulation/Gait  Yes    Ambulation/Gait Assistance  5: Supervision    Ambulation Distance (Feet)  400 Feet then 600 ft outdoors (x 8 minutes of walking)    Assistive device  Rollator    Gait Pattern  Step-through pattern;Decreased hip/knee flexion - left;Decreased step length - left;Decreased stance time - left;Poor foot clearance - left    Ambulation Surface  Level;Indoor;Unlevel;Outdoor    Gait Comments  Cues for posture, stay close to walker, L foot clearance      Exercises   Exercises  Knee/Hip;Ankle Review of HEP from last visit      Knee/Hip Exercises: Aerobic   Other Aerobic  Seated foot pedaler x 4 minutes forward, attempted back, but had difficulty with attempts at backwards direction      Knee/Hip Exercises: Standing   Knee Flexion  AROM;Right;Left;1 set;10 reps    Hip Flexion  AROM;Stengthening;Right;Left;10 reps;Knee bent;1 set    Hip Abduction  AROM;Stengthening;Right;Left;1 set;10 reps    Hip Extension  AROM;Stengthening;Right;Left;1 set;10 reps  Other Standing Knee Exercises  Sidestepping along counter R and L 10 ft, x 2 reps with UE support      Additional set of standing marching x 10 reps, with cues for lowering legs to maintain balance.  Forward marching along counter with UE support.       PT Education - 03/08/18 1316    Education Details  Walking program added to HEP; discussed timing of exercises:  seated exercises, standing exercises, walking, use of seated foot pedaler at home (to avoid over fatigue and spread out exercises over multiple days)    Person(s) Educated  Patient Wife not in lobby at end of session    Methods  Explanation    Comprehension  Verbalized understanding       PT Short Term Goals - 02/13/18 1055      PT SHORT TERM GOAL #1   Title  Pt will perform HEP with family's supervision for improved balance, strength, and gait.   TARGET (after waiting 1 week for auth):03/16/18    Time  4    Period  Weeks    Status  New    Target Date  03/16/18      PT SHORT TERM GOAL #2   Title  Pt will improve BERG Balance score to at least 32/56 for decreased fall risk.    Time  4    Period  Weeks    Status  New    Target Date  03/16/18      PT SHORT TERM GOAL #3   Title  Pt will improve TUG to less than or equal to 22 seconds for decreased fall risk.    Time  4    Period  Weeks    Status  New    Target Date  03/16/18      PT SHORT TERM GOAL #4   Title  Pt will perform sit<>stand transfers, at least 8 of 10 reps with minimal to no UE support, for improved safety and efficiency with transfers.    Time  4    Period  Weeks    Status  New    Target Date  03/16/18      PT SHORT TERM GOAL #5   Title  Pt/wife will verbalize understanding of fall prevention in home environment.    Time  4    Period  Weeks    Status  New    Target Date  03/16/18        PT Long Term Goals - 02/13/18 1058      PT LONG TERM GOAL #1   Title  Pt will improve gait velocity to at least 2.3 ft/sec for improved gait efficiency and safety.  TARGET 04/13/18    Time  8    Period  Weeks    Status  New    Target Date  04/13/18      PT LONG TERM GOAL #2   Title  Pt will improve Berg Balance score to at least 40/56 for decreased fall risk.    Time  8    Period  Weeks    Status  New    Target Date  04/13/18      PT LONG TERM GOAL #3   Title  Pt will improve TUG score to less than or equal to 15 seconds for decreased fall risk.    Time  8    Period  Weeks    Status  New    Target Date  04/13/18      PT LONG TERM GOAL #4   Title  Pt will ambulate at least 1000 ft, indoor and outdoor surfaces, modified independently, for improved outdoor and community gait.    Time  8    Period  Weeks    Status  New    Target Date  04/13/18      PT LONG TERM GOAL #5   Title  Pt will ambulate household distances, 50-100 ft using cane, with supervision, for  improved independence with gait.    Time  8    Period  Weeks    Status  New    Target Date  04/13/18      Additional Long Term Goals   Additional Long Term Goals  Yes      PT LONG TERM GOAL #6   Title  Pt/wife will verbalize plans for continued community fitness upon d/c from PT.    Time  8    Period  Weeks    Status  New    Target Date  04/13/18            Plan - 03/08/18 1320    Clinical Impression Statement  REviewed pt's standing stregnthening HEP given last visit, and discussed ways to fit in seated, standing exercises, walking and foot pedaler that patient has at home.  Pt reports doing these exercises at home without much fatigue.  Will continue to benefit from skilled PT to address balance, gait and strengthening.    Rehab Potential  Good    Clinical Impairments Affecting Rehab Potential  Cognitive communication deficit (wife present at eval, family supportive)    PT Frequency  2x / week    PT Duration  8 weeks plus eval    PT Treatment/Interventions  ADLs/Self Care Home Management;Gait training;Functional mobility training;Therapeutic activities;Therapeutic exercise;Balance training;DME Instruction;Neuromuscular re-education;Patient/family education;Orthotic Fit/Training    PT Next Visit Plan  Review walking plan for home; may try balance related exercises; use of seated maching with education for community fitness transition at some point    Consulted and Agree with Plan of Care  Patient    Family Member Consulted          Patient will benefit from skilled therapeutic intervention in order to improve the following deficits and impairments:  Abnormal gait, Decreased balance, Decreased knowledge of use of DME, Decreased mobility, Decreased safety awareness, Difficulty walking, Decreased strength  Visit Diagnosis: Muscle weakness (generalized)  Other abnormalities of gait and mobility     Problem List Patient Active Problem List   Diagnosis Date Noted  .  Chronic hepatitis C without hepatic coma ([place]) 09/25/2017  . Diabetes mellitus type 2, uncomplicated ([place]) 98/26/4158  . Myocardial infarction ([place]) 09/25/2017  . Renal transplant recipient 09/25/2017  . Transfusion history 09/25/2017  . History of coronary artery disease 09/12/2016  . 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 MI 09/12/2016  . Aftercare following organ transplant 09/11/2016  . 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 At risk for infection transmitted from donor 06/16/2014  . Ureteral stent displacement ([place]) 06/10/2014  . Prophylactic antibiotic 06/09/2014  . Immunosuppression ([place]) 06/06/2014    Marylan Glore W. 03/08/2018, 1:22 PM Frazier Butt., PT  [facility] [address] [place], [place], [address] Phone: [phone]   Fax:  [phone]  Name: Blayton Huttner Sr. MRN: [id number] Date of Birth: Apr 13, 1951

## 2018-03-08 NOTE — Therapy (Signed)
[facility] [address] [place], [place], [address] Phone: [phone]   Fax:  [phone]  Occupational Therapy Treatment  Patient Details  Name: Jonathan Farooqui Sr. MRN: [id number] Date of Birth: 1950/09/10 Referring Provider: Reather Laurence. Blanch Media   Encounter Date: 03/08/2018  OT End of Session - 03/08/18 0928    Visit Number  6    Number of Visits  25    Date for OT Re-Evaluation  05/14/18    Authorization Type  [employer] HRT    Authorization Time Period  12 visits approved through 04/12/18    Authorization - Visit Number  6    Authorization - Number of Visits  12    OT Start Time  0900 pt late    OT Stop Time  0930    OT Time Calculation (min)  30 min       Past Medical History:  Diagnosis Date  . Diabetes mellitus without complication ([place])   . Hypertension   . Renal disorder 2015   right kidney transplant  . Stroke [facility])     Past Surgical History:  Procedure Laterality Date  . BACK SURGERY     Has had 2 back surgeries  . Depression    . HAND SURGERY Right   . HAND SURGERY    . KIDNEY TRANSPLANT     November 2015  . Memory loss    . NEPHRECTOMY TRANSPLANTED ORGAN      There were no vitals filed for this visit.  Subjective Assessment - 03/08/18 0927    Pertinent History  2015-kidney transplant; 2017-ministrokes; port of R side of chest; fistula on L upper arm (No BP in LUE); ESRD, CVA, Hep C per hx    Patient Stated Goals  regain use of LUE    Currently in Pain?  No/denies             Treatment: seated closed chain shoulder flexion, then  diagonals to each direction overhead, min v.c for posture and positioning, rest break in between each set. Pt demonstrates improved LUE control. Standing to perform dynamic functional reaching to place graded clothespins on vertical antennae with LUE for sustained pinch, then removing in seated mid-high range reaching, no LOB, min v.c for  positioning.                OT Short Term Goals - 02/13/18 1249      OT SHORT TERM GOAL #1   Title  Pt/ caregiver will be I with inital HEP.  due 03/15/18    Time  4    Period  Weeks    Status  New    Target Date  03/15/18      OT SHORT TERM GOAL #2   Title  Pt will perform UB dressing with setup/ supervision, LB dressing with min A.    Time  4    Period  Weeks    Status  New      OT SHORT TERM GOAL #3   Title  Pt will use LUE as a non-dominant assist at least 25 % of the time for ADLs/ IADLS.    Baseline  currently pt does not use LUE functionally    Time  4    Period  Weeks    Status  New      OT SHORT TERM GOAL #4   Title  Pt will demonstrate improved LUE fine motor coordination as evidenced by performing 9 hole peg test in  90 secs or less.    Baseline  RUE 49.59 secs, LUE  1 min 51 secs    Time  4    Period  Weeks    Status  New      OT SHORT TERM GOAL #5   Title  Pt / family will verbalize understanding of LUE postioning to minimize pain, edema and risk for injury.    Baseline  moderate edema in left hand    Time  4    Period  Weeks    Status  New      Additional Short Term Goals   Additional Short Term Goals  Yes      OT SHORT TERM GOAL #6   Title  Pt/ caregiver will verbalize understanding of compensatory strategies for short term memory and cognitive deficits.    Time  4    Period  Weeks    Status  New      OT SHORT TERM GOAL #7   Title  Pt will perform a simple task requiring organization and problem solving with 75% accuracy.    Time  4    Period  Weeks    Status  New      OT SHORT TERM GOAL #8   Title  Further assess cognition prn and add additional goals as needed    Time  4    Period  Weeks    Status  New        OT Long Term Goals - 02/13/18 1254      OT LONG TERM GOAL #1   Title  Pt/ caregiver will be I with updated HEP.    Time  12    Period  Weeks    Status  New    Target Date  05/14/18      OT LONG TERM GOAL #2    Title  Pt will perform all basic ADLS modified independently.    Time  12    Period  Weeks    Status  New      OT LONG TERM GOAL #3   Title  Pt will perform simple snack or beverage prep with supervision .    Time  12    Period  Weeks    Status  New      OT LONG TERM GOAL #4   Title  Pt will use LUE as a non-dominant assist at least 50% of the time for ADLs/ IADLs.    Time  12    Period  Weeks    Status  New      OT LONG TERM GOAL #5   Title  Pt will demonstrate improved LUE fine motor coordination for ADLS as evidenced by performing 9-hole peg test in 60 secs or less.    Time  12    Period  Weeks    Status  New      OT LONG TERM GOAL #6   Title  Pt will perfrom simple home management tasks with supervision demonstrating good safety awareness.    Time  12    Period  Weeks    Status  New            Plan - 03/08/18 6387    Clinical Impression Statement  Pt is progressing towards goals. He demonstrates continued improving standing balance and LUE functional use .    Occupational Profile and client history currently impacting functional performance   Prior to pt's CVA he was I with  ADLS, driving and enjoyed exercise at [facility]. PMH ESRD, dialysis, hx of CVA, 2015-kidney transplant; 2017-ministrokes; port of R side of chest; fistula on L upper arm (No BP in LUE);     Rehab Potential  Good    OT Frequency  2x / week    OT Duration  12 weeks    OT Treatment/Interventions  Self-care/ADL training;Therapeutic exercise;Patient/family education;Splinting;Neuromuscular education;Balance training;Therapeutic activities;Functional Mobility Training;Energy conservation;Fluidtherapy;Ultrasound;Cryotherapy;DME and/or AE instruction;Manual Therapy;Passive range of motion;Cognitive remediation/compensation;Visual/perceptual remediation/compensation;Moist Heat;Paraffin;Aquatic Therapy    Plan  continue to address LUe functional use, check on putty HEP.    Consulted and Agree with Plan of  Care  Patient       Patient will benefit from skilled therapeutic intervention in order to improve the following deficits and impairments:     Visit Diagnosis: No diagnosis found.    Problem List Patient Active Problem List   Diagnosis Date Noted  . Chronic hepatitis C without hepatic coma ([place]) 09/25/2017  . Diabetes mellitus type 2, uncomplicated ([place]) 47/20/7218  . Myocardial infarction ([place]) 09/25/2017  . Renal transplant recipient 09/25/2017  . Transfusion history 09/25/2017  . History of coronary artery disease 09/12/2016  . 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 MI 09/12/2016  . Aftercare following organ transplant 09/11/2016  . 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 At risk for infection transmitted from donor 06/16/2014  . Ureteral stent displacement ([place]) 06/10/2014  . Prophylactic antibiotic 06/09/2014  . Immunosuppression ([place]) 06/06/2014    RINE,KATHRYN 03/08/2018, 9:46 AM  [facility] [address] [place], [place], [address] Phone: [phone]   Fax:  [phone]  Name: Jonathan Kushner Sr. MRN: [id number] Date of Birth: Jul 30, 1951

## 2018-03-13 ENCOUNTER — Ambulatory Visit: Payer: Medicare Other | Admitting: Occupational Therapy

## 2018-03-13 ENCOUNTER — Ambulatory Visit: Payer: Medicare Other | Admitting: Physical Therapy

## 2018-03-15 ENCOUNTER — Ambulatory Visit: Payer: Medicare Other | Admitting: Occupational Therapy

## 2018-03-15 ENCOUNTER — Other Ambulatory Visit: Payer: Self-pay | Admitting: Internal Medicine

## 2018-03-15 DIAGNOSIS — M6281 Muscle weakness (generalized): Secondary | ICD-10-CM | POA: Diagnosis not present

## 2018-03-15 DIAGNOSIS — Z86010 Personal history of colonic polyps: Secondary | ICD-10-CM

## 2018-03-15 DIAGNOSIS — I69315 Cognitive social or emotional deficit following cerebral infarction: Secondary | ICD-10-CM

## 2018-03-15 DIAGNOSIS — R278 Other lack of coordination: Secondary | ICD-10-CM

## 2018-03-15 DIAGNOSIS — Z87891 Personal history of nicotine dependence: Secondary | ICD-10-CM

## 2018-03-15 NOTE — Therapy (Signed)
[facility] [address] [place], [place], [address] Phone: [phone]   Fax:  [phone]  Occupational Therapy Treatment  Patient Details  Name: Jonathan Tippy Sr. MRN: [id number] Date of Birth: May 31, 1951 Referring Provider: Reather Laurence. Blanch Media   Encounter Date: 03/15/2018  OT End of Session - 03/15/18 0910    Visit Number  7    Number of Visits  25    Date for OT Re-Evaluation  05/14/18    Authorization Type  [employer] HRT    Authorization Time Period  12 visits approved through 04/12/18    Authorization - Visit Number  7    Authorization - Number of Visits  12    OT Start Time  0845    OT Stop Time  0930    OT Time Calculation (min)  45 min    Activity Tolerance  Patient tolerated treatment well    Behavior During Therapy  [facility] for tasks assessed/performed       Past Medical History:  Diagnosis Date  . Diabetes mellitus without complication ([place])   . Hypertension   . Renal disorder 2015   right kidney transplant  . Stroke [facility])     Past Surgical History:  Procedure Laterality Date  . BACK SURGERY     Has had 2 back surgeries  . Depression    . HAND SURGERY Right   . HAND SURGERY    . KIDNEY TRANSPLANT     November 2015  . Memory loss    . NEPHRECTOMY TRANSPLANTED ORGAN      There were no vitals filed for this visit.  Subjective Assessment - 03/15/18 0849    Pertinent History  2015-kidney transplant; 2017-ministrokes; port of R side of chest; fistula on L upper arm (No BP in LUE); ESRD, CVA, Hep C per hx    Patient Stated Goals  regain use of LUE    Currently in Pain?  No/denies                   OT Treatments/Exercises (OP) - 03/15/18 0001      ADLs   LB Dressing  Pt demo tying Lt shoe      Neurological Re-education Exercises   Other Exercises 1  Bilateral sh. flexion with weighted ball (3.3. lbs) x 10 reps for UE strength/endurance and cues to prevent sh. hiking when fatigued.      Other Exercises 2  Bilateral high level AA/ROM in shoulder flexion w/ ball along wall x 10 reps      Functional Reaching Activities   High Level  LUE high level reaching to place small pegs in pegboard (vertical surface) copying peg design for LUE ROM/endurance, Lt hand FMC, and visual/perceptual skills. Pt required mod cues/assist to copy design correctly, and to correct errors. Pt required extra time to complete and prompts to finish design (to keep going after one color completed)               OT Short Term Goals - 03/15/18 0911      OT SHORT TERM GOAL #1   Title  Pt/ caregiver will be I with inital HEP.  due 03/15/18    Time  4    Period  Weeks    Status  Achieved      OT SHORT TERM GOAL #2   Title  Pt will perform UB dressing with setup/ supervision, LB dressing with min A.    Time  4  Period  Weeks    Status  On-going      OT SHORT TERM GOAL #3   Title  Pt will use LUE as a non-dominant assist at least 25 % of the time for ADLs/ IADLS.    Baseline  currently pt does not use LUE functionally    Time  4    Period  Weeks    Status  On-going      OT SHORT TERM GOAL #4   Title  Pt will demonstrate improved LUE fine motor coordination as evidenced by performing 9 hole peg test in 90 secs or less.    Baseline  RUE 49.59 secs, LUE  1 min 51 secs    Time  4    Period  Weeks    Status  On-going      OT SHORT TERM GOAL #5   Title  Pt / family will verbalize understanding of LUE postioning to minimize pain, edema and risk for injury.    Baseline  moderate edema in left hand    Time  4    Period  Weeks    Status  On-going      OT SHORT TERM GOAL #6   Title  Pt/ caregiver will verbalize understanding of compensatory strategies for short term memory and cognitive deficits.    Time  4    Period  Weeks    Status  New      OT SHORT TERM GOAL #7   Title  Pt will perform a simple task requiring organization and problem solving with 75% accuracy.    Time  4    Period   Weeks    Status  New      OT SHORT TERM GOAL #8   Title  Further assess cognition prn and add additional goals as needed    Time  4    Period  Weeks    Status  New               Plan - 03/15/18 0913    Clinical Impression Statement  Pt progressing with LUE function and ADL goals. Noted cognitive/visual perceptual deficits during task today.     Occupational Profile and client history currently impacting functional performance   Prior to pt's CVA he was I with ADLS, driving and enjoyed exercise at [employer]. PMH ESRD, dialysis, hx of CVA, 2015-kidney transplant; 2017-ministrokes; port of R side of chest; fistula on L upper arm (No BP in LUE);     Occupational performance deficits (Please refer to evaluation for details):  IADL's;ADL's;Play;Leisure;Social Participation    Rehab Potential  Good    Current Impairments/barriers affecting progress:  cognitive deficits, dialysis, complex medical hx    OT Frequency  2x / week    OT Duration  12 weeks    OT Treatment/Interventions  Self-care/ADL training;Therapeutic exercise;Patient/family education;Splinting;Neuromuscular education;Balance training;Therapeutic activities;Functional Mobility Training;Energy conservation;Fluidtherapy;Ultrasound;Cryotherapy;DME and/or AE instruction;Manual Therapy;Passive range of motion;Cognitive remediation/compensation;Visual/perceptual remediation/compensation;Moist Heat;Paraffin;Aquatic Therapy    Plan  Address STG #7, Assess STG #8, further assess vision    Consulted and Agree with Plan of Care  Patient       Patient will benefit from skilled therapeutic intervention in order to improve the following deficits and impairments:  Abnormal gait, Decreased cognition, Decreased knowledge of use of DME, Increased edema, Pain, Impaired flexibility, Impaired sensation, Decreased coordination, Decreased mobility, Decreased activity tolerance, Decreased endurance, Decreased range of motion, Decreased strength,  Impaired UE functional use, Impaired perceived functional ability, Difficulty  walking, Decreased safety awareness, Decreased knowledge of precautions, Decreased balance  Visit Diagnosis: Other lack of coordination  Cognitive social or emotional deficit following cerebral infarction  Muscle weakness (generalized)    Problem List Patient Active Problem List   Diagnosis Date Noted  . Chronic hepatitis C without hepatic coma ([place]) 09/25/2017  . Diabetes mellitus type 2, uncomplicated ([place]) 51/89/8421  . Myocardial infarction ([place]) 09/25/2017  . Renal transplant recipient 09/25/2017  . Transfusion history 09/25/2017  . History of coronary artery disease 09/12/2016  . 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 MI 09/12/2016  . Aftercare following organ transplant 09/11/2016  . 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 At risk for infection transmitted from donor 06/16/2014  . Ureteral stent displacement ([place]) 06/10/2014  . Prophylactic antibiotic 06/09/2014  . Immunosuppression ([place]) 06/06/2014    Carey Bullocks, OTR/L 03/15/2018, 10:09 AM  [facility] [address] [place], [place], [address] Phone: [phone]   Fax:  [phone]  Name: Jonathan Lukasik Sr. MRN: [id number] Date of Birth: July 02, 1951

## 2018-03-16 ENCOUNTER — Ambulatory Visit: Payer: Self-pay | Admitting: Physical Therapy

## 2018-03-16 ENCOUNTER — Encounter: Payer: Self-pay | Admitting: Occupational Therapy

## 2018-03-20 ENCOUNTER — Ambulatory Visit: Payer: Medicare Other | Admitting: Physical Therapy

## 2018-03-20 ENCOUNTER — Ambulatory Visit: Payer: Medicare Other | Admitting: Occupational Therapy

## 2018-03-20 ENCOUNTER — Encounter: Payer: Self-pay | Admitting: Physical Therapy

## 2018-03-20 DIAGNOSIS — R2681 Unsteadiness on feet: Secondary | ICD-10-CM

## 2018-03-20 DIAGNOSIS — M6281 Muscle weakness (generalized): Secondary | ICD-10-CM

## 2018-03-20 DIAGNOSIS — R2689 Other abnormalities of gait and mobility: Secondary | ICD-10-CM

## 2018-03-20 DIAGNOSIS — R278 Other lack of coordination: Secondary | ICD-10-CM

## 2018-03-20 DIAGNOSIS — I69315 Cognitive social or emotional deficit following cerebral infarction: Secondary | ICD-10-CM

## 2018-03-20 NOTE — Therapy (Signed)
[facility] [address] [place], [place], [address] Phone: [phone]   Fax:  [phone]  Physical Therapy Treatment  Patient Details  Name: Jonathan Cunliffe Sr. MRN: [id number] Date of Birth: 1951/06/21 Referring Provider: Reather Laurence. Blanch Media   Encounter Date: 03/20/2018  PT End of Session - 03/20/18 1117    Visit Number  7    Number of Visits  17    Date for PT Re-Evaluation  05/14/18    Authorization Type  [employer] H[employer] Medicare Replacement-12 visits approved for PT; 10th visit progress note needed; [id number]    Authorization Time Period  02-12-18 through 04-12-18    Authorization - Visit Number  7    Authorization - Number of Visits  12    PT Start Time  0930    PT Stop Time  9702    PT Time Calculation (min)  44 min    Activity Tolerance  Patient tolerated treatment well    Behavior During Therapy  [facility] for tasks assessed/performed       Past Medical History:  Diagnosis Date  . Diabetes mellitus without complication ([place])   . Hypertension   . Renal disorder 2015   right kidney transplant  . Stroke [facility])     Past Surgical History:  Procedure Laterality Date  . BACK SURGERY     Has had 2 back surgeries  . Depression    . HAND SURGERY Right   . HAND SURGERY    . KIDNEY TRANSPLANT     November 2015  . Memory loss    . NEPHRECTOMY TRANSPLANTED ORGAN      There were no vitals filed for this visit.  Subjective Assessment - 03/20/18 0934    Subjective  No changes, nothing new.  Been trying to do the exercises twice a day.    Patient is accompained by:  Family member   wife, Jonathan [name]   Pertinent History  2015-kidney transplant; 2017-ministrokes; port of R side of chest; fistula on L upper arm (No BP in LUE); Independent prior to hospitalization, 3 weeks in hospital>[facility]>home    Patient Stated Goals  Pt's goal is to "learn how to walk again."  Wife's goal is to get stronger and get balance  better.    Currently in Pain?  No/denies                       [facility] Adult PT Treatment/Exercise - 03/20/18 0936      Transfers   Transfers  Sit to Stand;Stand to Sit    Sit to Stand  6: Modified independent (Device/Increase time);With upper extremity assist;From chair/3-in-1    Stand to Sit  6: Modified independent (Device/Increase time);With upper extremity assist;To chair/3-in-1    Number of Reps  10 reps      Ambulation/Gait   Ambulation/Gait  Yes    Ambulation/Gait Assistance  6: Modified independent (Device/Increase time)    Ambulation Distance (Feet)  200 Feet   then 400 ft   Assistive device  Rollator    Gait Pattern  Step-through pattern;Decreased hip/knee flexion - left;Decreased step length - left;Decreased stance time - left;Poor foot clearance - left    Ambulation Surface  Level;Indoor    Gait velocity  15.06    Gait Comments  Cues for posture, stay close to walker, L foot clearance      Standardized Balance Assessment   Standardized Balance Assessment  Berg Balance Test;Timed Up and Go Test  Berg Balance Test   Sit to Stand  Able to stand  independently using hands    Standing Unsupported  Able to stand safely 2 minutes    Sitting with Back Unsupported but Feet Supported on Floor or Stool  Able to sit safely and securely 2 minutes    Stand to Sit  Controls descent by using hands    Transfers  Able to transfer safely, definite need of hands    Standing Unsupported with Eyes Closed  Able to stand 10 seconds with supervision    Standing Ubsupported with Feet Together  Able to place feet together independently and stand for 1 minute with supervision    From Standing, Reach Forward with Outstretched Arm  Can reach forward >12 cm safely (5")    From Standing Position, Pick up Object from [place] to pick up shoe safely and easily    From Standing Position, Turn to Look Behind Over each Shoulder  Looks behind one side only/other side shows less weight  shift    Turn 360 Degrees  Able to turn 360 degrees safely but slowly    Standing Unsupported, Alternately Place Feet on Step/Stool  Needs assistance to keep from falling or unable to try    Standing Unsupported, One Foot in [employer] balance while stepping or standing    Standing on One Leg  Unable to try or needs assist to prevent fall    Total Score  35      Timed Up and Go Test   TUG  Normal TUG    Normal TUG (seconds)  32.1   using rollator, good form with use of rollator brakes     Knee/Hip Exercises: Standing   Knee Flexion  AROM;Right;Left;1 set;10 reps    Hip Flexion  AROM;Stengthening;Right;Left;10 reps;Knee bent;1 set    Hip Abduction  AROM;Stengthening;Right;Left;1 set;10 reps    Hip Extension  AROM;Stengthening;Right;Left;1 set;10 reps    Other Standing Knee Exercises  Sidestepping along counter R and L 10 ft, x 2 reps with UE support    Other Standing Knee Exercises  review of HEP-pt return demo understanding      Knee/Hip Exercises: Seated   Long Arc Quad  Strengthening;Right;Left;10 reps;1 set    Marching  Strengthening;Right;Left;10 reps;1 set    Hamstring Curl  Strengthening;Right;Left;2 sets;10 reps   progressed to using green theraband     Ankle Exercises: Seated   Heel Raises  Both;10 reps    Toe Raise  10 reps               PT Short Term Goals - 03/20/18 0954      PT SHORT TERM GOAL #1   Title  Pt will perform HEP with family's supervision for improved balance, strength, and gait.  TARGET (after waiting 1 week for auth):03/16/18    Time  4    Period  Weeks    Status  Achieved      PT SHORT TERM GOAL #2   Title  Pt will improve BERG Balance score to at least 32/56 for decreased fall risk.    Baseline  35/56 03/20/18    Time  4    Period  Weeks    Status  Achieved      PT SHORT TERM GOAL #3   Title  Pt will improve TUG to less than or equal to 22 seconds for decreased fall risk.    Baseline  32.1 with rollator 03/20/18    Time  4    Period   Weeks    Status  Not Met      PT SHORT TERM GOAL #4   Title  Pt will perform sit<>stand transfers, at least 8 of 10 reps with minimal to no UE support, for improved safety and efficiency with transfers.    Baseline  Requires minimal UE support (unable to perform without UE support)    Time  4    Period  Weeks    Status  Partially Met      PT SHORT TERM GOAL #5   Title  Pt/wife will verbalize understanding of fall prevention in home environment.    Time  4    Period  Weeks    Status  Achieved        PT Long Term Goals - 02/13/18 1058      PT LONG TERM GOAL #1   Title  Pt will improve gait velocity to at least 2.3 ft/sec for improved gait efficiency and safety.  TARGET 04/13/18    Time  8    Period  Weeks    Status  New    Target Date  04/13/18      PT LONG TERM GOAL #2   Title  Pt will improve Berg Balance score to at least 40/56 for decreased fall risk.    Time  8    Period  Weeks    Status  New    Target Date  04/13/18      PT LONG TERM GOAL #3   Title  Pt will improve TUG score to less than or equal to 15 seconds for decreased fall risk.    Time  8    Period  Weeks    Status  New    Target Date  04/13/18      PT LONG TERM GOAL #4   Title  Pt will ambulate at least 1000 ft, indoor and outdoor surfaces, modified independently, for improved outdoor and community gait.    Time  8    Period  Weeks    Status  New    Target Date  04/13/18      PT LONG TERM GOAL #5   Title  Pt will ambulate household distances, 50-100 ft using cane, with supervision, for improved independence with gait.    Time  8    Period  Weeks    Status  New    Target Date  04/13/18      Additional Long Term Goals   Additional Long Term Goals  Yes      PT LONG TERM GOAL #6   Title  Pt/wife will verbalize plans for continued community fitness upon d/c from PT.    Time  8    Period  Weeks    Status  New    Target Date  04/13/18            Plan - 03/20/18 1118    Clinical Impression  Statement  Assessed STGs this visit, with pt meeting STG 1, 2, 5.  STG 4 for sit<>stand partially met, as pt requires minimal use of UE support for repeated sit<>stand.  STG 3 not met for decreased time on TUG score; however, pt demonstrates safe transfers and appropriate use of rollator/brakes during TUG.  Pt has improved Berg score to 35/56, but remains at fall risk and PT continues to recommend use of rollator for safety with gait.    Rehab Potential  Good  Clinical Impairments Affecting Rehab Potential  Cognitive communication deficit (wife present at eval, family supportive)    PT Frequency  2x / week    PT Duration  8 weeks   plus eval   PT Treatment/Interventions  ADLs/Self Care Home Management;Gait training;Functional mobility training;Therapeutic activities;Therapeutic exercise;Balance training;DME Instruction;Neuromuscular re-education;Patient/family education;Orthotic Fit/Training    PT Next Visit Plan  Review walking plan for home; may try balance related exercises; use of seated maching with education for community fitness transition at some point    Consulted and Agree with Plan of Care  Patient    Family Member Consulted          Patient will benefit from skilled therapeutic intervention in order to improve the following deficits and impairments:  Abnormal gait, Decreased balance, Decreased knowledge of use of DME, Decreased mobility, Decreased safety awareness, Difficulty walking, Decreased strength  Visit Diagnosis: Unsteadiness on feet  Other abnormalities of gait and mobility  Muscle weakness (generalized)     Problem List Patient Active Problem List   Diagnosis Date Noted  . Chronic hepatitis C without hepatic coma ([place]) 09/25/2017  . Diabetes mellitus type 2, uncomplicated ([place]) 33/58/2518  . Myocardial infarction ([place]) 09/25/2017  . Renal transplant recipient 09/25/2017  . Transfusion history 09/25/2017  . History of coronary artery disease 09/12/2016  . LV  (left ventricular) mural thrombus without MI 09/12/2016  . Aftercare following organ transplant 09/11/2016  . 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 At risk for infection transmitted from donor 06/16/2014  . Ureteral stent displacement ([place]) 06/10/2014  . Prophylactic antibiotic 06/09/2014  . Immunosuppression ([place]) 06/06/2014    Ladislaus Repsher W. 03/20/2018, 11:22 AM  Frazier Butt., PT   [facility] [address] [place], [place], [address] Phone: [phone]   Fax:  [phone]  Name: Dakoda Laventure Sr. MRN: [id number] Date of Birth: [birth year]

## 2018-03-20 NOTE — Therapy (Signed)
[facility] [address] [place], [place], [address] Phone: [phone]   Fax:  [phone]  Occupational Therapy Treatment  Patient Details  Name: Jonathan Mcsweeney Sr. MRN: [id number] Date of Birth: [birth year] Referring Provider: Reather Laurence. Blanch Media   Encounter Date: 03/20/2018  OT End of Session - 03/20/18 1049    Visit Number  8    Number of Visits  25    Date for OT Re-Evaluation  05/14/18    Authorization Type  [employer] HRT    Authorization Time Period  12 visits approved through 04/12/18    Authorization - Visit Number  8    Authorization - Number of Visits  12    Activity Tolerance  Patient tolerated treatment well    Behavior During Therapy  [facility] for tasks assessed/performed       Past Medical History:  Diagnosis Date  . Diabetes mellitus without complication ([place])   . Hypertension   . Renal disorder 2015   right kidney transplant  . Stroke [facility])     Past Surgical History:  Procedure Laterality Date  . BACK SURGERY     Has had 2 back surgeries  . Depression    . HAND SURGERY Right   . HAND SURGERY    . KIDNEY TRANSPLANT     November 2015  . Memory loss    . NEPHRECTOMY TRANSPLANTED ORGAN      There were no vitals filed for this visit.  Subjective Assessment - 03/20/18 1047    Subjective   I'm good    Pertinent History  2015-kidney transplant; 2017-ministrokes; port of R side of chest; fistula on L upper arm (No BP in LUE); ESRD, CVA, Hep C per hx    Patient Stated Goals  regain use of LUE    Currently in Pain?  No/denies             Treatment: Seated closed chain shoulder flexion with 3 lbs ball followed by closed chain shoulder flexion with 8 lbs weight, however discontinued due to malpositioning. Yellow theraband exercises for shoulder horizontal abduction, biceps curls, shoulder flexion, extension, mod v.c. Therapist did not issue as pt needs caregiver reinforcement and caregiver was not  available. Graded clothespins with LUE, min v.c in standing.                OT Short Term Goals - 03/20/18 1050      OT SHORT TERM GOAL #1   Title  Pt/ caregiver will be I with inital HEP.  due 03/15/18    Time  4    Period  Weeks    Status  Achieved      OT SHORT TERM GOAL #2   Title  Pt will perform UB dressing with setup/ supervision, LB dressing with min A.    Time  4    Period  Weeks    Status  Achieved      OT SHORT TERM GOAL #3   Title  Pt will use LUE as a non-dominant assist at least 25 % of the time for ADLs/ IADLS.    Baseline  currently pt does not use LUE functionally    Time  4    Period  Weeks    Status  On-going   per pt report     OT SHORT TERM GOAL #4   Title  Pt will demonstrate improved LUE fine motor coordination as evidenced by performing 9 hole peg test in 90 secs  or less.    Baseline  RUE 49.59 secs, LUE  1 min 51 secs    Time  4    Period  Weeks    Status  On-going      OT SHORT TERM GOAL #5   Title  Pt / family will verbalize understanding of LUE postioning to minimize pain, edema and risk for injury.    Baseline  moderate edema in left hand    Time  4    Period  Weeks    Status  Deferred   Pt demonstrates decreased overall edema and pain in LUE     OT SHORT TERM GOAL #6   Title  Pt/ caregiver will verbalize understanding of compensatory strategies for short term memory and cognitive deficits.    Time  4    Period  Weeks    Status  On-going      OT SHORT TERM GOAL #7   Title  Pt will perform a simple task requiring organization and problem solving with 75% accuracy.    Time  4    Period  Weeks    Status  On-going      OT SHORT TERM GOAL #8   Title  Further assess cognition prn and add additional goals as needed    Time  4    Period  Weeks    Status  Deferred        OT Long Term Goals - 03/20/18 1052      OT LONG TERM GOAL #1   Title  Pt/ caregiver will be I with updated HEP.    Time  12    Period  Weeks    Status   On-going      OT LONG TERM GOAL #2   Title  Pt will perform all basic ADLS modified independently.    Time  12    Period  Weeks    Status  On-going      OT LONG TERM GOAL #3   Title  Pt will perform simple snack or beverage prep with supervision .    Time  12    Period  Weeks    Status  On-going      OT LONG TERM GOAL #4   Title  Pt will use LUE as a non-dominant assist at least 50% of the time for ADLs/ IADLs.    Time  12    Period  Weeks    Status  On-going      OT LONG TERM GOAL #5   Title  Pt will demonstrate improved LUE fine motor coordination for ADLS as evidenced by performing 9-hole peg test in 60 secs or less.    Time  12    Period  Weeks    Status  On-going      OT LONG TERM GOAL #6   Title  Pt will perfrom simple home management tasks with supervision demonstrating good safety awareness.    Time  12    Period  Weeks    Status  On-going            Plan - 03/20/18 1048    Clinical Impression Statement  Pt progressing towards goals for LUE functional use and coordination.    Occupational Profile and client history currently impacting functional performance   Prior to pt's CVA he was I with ADLS, driving and enjoyed exercise at [employer]. PMH ESRD, dialysis, hx of CVA, 2015-kidney transplant; 2017-ministrokes; port of R side of  chest; fistula on L upper arm (No BP in LUE);     Occupational performance deficits (Please refer to evaluation for details):  IADL's;ADL's;Play;Leisure;Social Participation    Rehab Potential  Good    Current Impairments/barriers affecting progress:  cognitive deficits, dialysis, complex medical hx    OT Frequency  2x / week    OT Duration  12 weeks    OT Treatment/Interventions  Self-care/ADL training;Therapeutic exercise;Patient/family education;Splinting;Neuromuscular education;Balance training;Therapeutic activities;Functional Mobility Training;Energy conservation;Fluidtherapy;Ultrasound;Cryotherapy;DME and/or AE  instruction;Manual Therapy;Passive range of motion;Cognitive remediation/compensation;Visual/perceptual remediation/compensation;Moist Heat;Paraffin;Aquatic Therapy    Plan  check STG's    Consulted and Agree with Plan of Care  Patient       Patient will benefit from skilled therapeutic intervention in order to improve the following deficits and impairments:  Abnormal gait, Decreased cognition, Decreased knowledge of use of DME, Increased edema, Pain, Impaired flexibility, Impaired sensation, Decreased coordination, Decreased mobility, Decreased activity tolerance, Decreased endurance, Decreased range of motion, Decreased strength, Impaired UE functional use, Impaired perceived functional ability, Difficulty walking, Decreased safety awareness, Decreased knowledge of precautions, Decreased balance  Visit Diagnosis: Other lack of coordination  Cognitive social or emotional deficit following cerebral infarction  Muscle weakness (generalized)    Problem List Patient Active Problem List   Diagnosis Date Noted  . Chronic hepatitis C without hepatic coma ([place]) 09/25/2017  . Diabetes mellitus type 2, uncomplicated ([place]) 58/85/0277  . Myocardial infarction ([place]) 09/25/2017  . Renal transplant recipient 09/25/2017  . Transfusion history 09/25/2017  . History of coronary artery disease 09/12/2016  . 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 MI 09/12/2016  . Aftercare following organ transplant 09/11/2016  . 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 At risk for infection transmitted from donor 06/16/2014  . Ureteral stent displacement ([place]) 06/10/2014  . Prophylactic antibiotic 06/09/2014  . Immunosuppression ([place]) 06/06/2014    Lamaria Hildebrandt 03/20/2018, 5:42 PM  [facility] [address] [place], [place], [address] Phone: [phone]   Fax:  [phone]  Name: Jonathan Gaillard Sr. MRN:  [id number] Date of Birth: 1950/08/11

## 2018-03-22 ENCOUNTER — Ambulatory Visit: Payer: Medicare Other | Admitting: Occupational Therapy

## 2018-03-22 DIAGNOSIS — I69315 Cognitive social or emotional deficit following cerebral infarction: Secondary | ICD-10-CM

## 2018-03-22 DIAGNOSIS — M6281 Muscle weakness (generalized): Secondary | ICD-10-CM | POA: Diagnosis not present

## 2018-03-22 DIAGNOSIS — R278 Other lack of coordination: Secondary | ICD-10-CM

## 2018-03-22 NOTE — Therapy (Signed)
[facility] [address] [place], [place], [address] Phone: [phone]   Fax:  [phone]  Occupational Therapy Treatment  Patient Details  Name: Jonathan Alcock Sr. MRN: [id number] Date of Birth: [birth year] Referring Provider: Reather Laurence. Blanch Media   Encounter Date: 03/22/2018  OT End of Session - 03/22/18 0906    Visit Number  9    Number of Visits  25    Date for OT Re-Evaluation  05/14/18    Authorization Type  BC[employer] HRT    Authorization Time Period  12 visits approved through 04/12/18    Authorization - Visit Number  9    Authorization - Number of Visits  12    OT Start Time  [phone]    OT Stop Time  0930   pt late   OT Time Calculation (min)  27 min    Activity Tolerance  Patient tolerated treatment well    Behavior During Therapy  [facility] for tasks assessed/performed       Past Medical History:  Diagnosis Date  . Diabetes mellitus without complication ([place])   . Hypertension   . Renal disorder 2015   right kidney transplant  . Stroke [facility])     Past Surgical History:  Procedure Laterality Date  . BACK SURGERY     Has had 2 back surgeries  . Depression    . HAND SURGERY Right   . HAND SURGERY    . KIDNEY TRANSPLANT     November 2015  . Memory loss    . NEPHRECTOMY TRANSPLANTED ORGAN      There were no vitals filed for this visit.  Subjective Assessment - 03/22/18 0905    Pertinent History  2015-kidney transplant; 2017-ministrokes; port of R side of chest; fistula on L upper arm (No BP in LUE); ESRD, CVA, Hep C per hx    Patient Stated Goals  regain use of LUE    Currently in Pain?  No/denies              Treatment: Seated shoulder flexion, then diagonals to each side 10-15 reps, min v.c  Yellow theraband for horizontal abduction and biceps curls mod v.c for for.  Functional ovehead reaching with LUE in standing min v.c and supervision.               OT Short Term Goals -  03/20/18 1050      OT SHORT TERM GOAL #1   Title  Pt/ caregiver will be I with inital HEP.  due 03/15/18    Time  4    Period  Weeks    Status  Achieved      OT SHORT TERM GOAL #2   Title  Pt will perform UB dressing with setup/ supervision, LB dressing with min A.    Time  4    Period  Weeks    Status  Achieved      OT SHORT TERM GOAL #3   Title  Pt will use LUE as a non-dominant assist at least 25 % of the time for ADLs/ IADLS.    Baseline  currently pt does not use LUE functionally    Time  4    Period  Weeks    Status  On-going   per pt report     OT SHORT TERM GOAL #4   Title  Pt will demonstrate improved LUE fine motor coordination as evidenced by performing 9 hole peg test in 90 secs or less.  Baseline  RUE 49.59 secs, LUE  1 min 51 secs    Time  4    Period  Weeks    Status  On-going      OT SHORT TERM GOAL #5   Title  Pt / family will verbalize understanding of LUE postioning to minimize pain, edema and risk for injury.    Baseline  moderate edema in left hand    Time  4    Period  Weeks    Status  Deferred   Pt demonstrates decreased overall edema and pain in LUE     OT SHORT TERM GOAL #6   Title  Pt/ caregiver will verbalize understanding of compensatory strategies for short term memory and cognitive deficits.    Time  4    Period  Weeks    Status  On-going      OT SHORT TERM GOAL #7   Title  Pt will perform a simple task requiring organization and problem solving with 75% accuracy.    Time  4    Period  Weeks    Status  On-going      OT SHORT TERM GOAL #8   Title  Further assess cognition prn and add additional goals as needed    Time  4    Period  Weeks    Status  Deferred        OT Long Term Goals - 03/20/18 1052      OT LONG TERM GOAL #1   Title  Pt/ caregiver will be I with updated HEP.    Time  12    Period  Weeks    Status  On-going      OT LONG TERM GOAL #2   Title  Pt will perform all basic ADLS modified independently.    Time   12    Period  Weeks    Status  On-going      OT LONG TERM GOAL #3   Title  Pt will perform simple snack or beverage prep with supervision .    Time  12    Period  Weeks    Status  On-going      OT LONG TERM GOAL #4   Title  Pt will use LUE as a non-dominant assist at least 50% of the time for ADLs/ IADLs.    Time  12    Period  Weeks    Status  On-going      OT LONG TERM GOAL #5   Title  Pt will demonstrate improved LUE fine motor coordination for ADLS as evidenced by performing 9-hole peg test in 60 secs or less.    Time  12    Period  Weeks    Status  On-going      OT LONG TERM GOAL #6   Title  Pt will perfrom simple home management tasks with supervision demonstrating good safety awareness.    Time  12    Period  Weeks    Status  On-going            Plan - 03/22/18 0908    Clinical Impression Statement  Pt progressing towards goals with improving LUE functional use.    Occupational Profile and client history currently impacting functional performance   Prior to pt's CVA he was I with ADLS, driving and enjoyed exercise at [employer]. PMH ESRD, dialysis, hx of CVA, 2015-kidney transplant; 2017-ministrokes; port of R side of chest; fistula on L upper arm (  No BP in LUE);     Occupational performance deficits (Please refer to evaluation for details):  IADL's;ADL's;Play;Leisure;Social Participation    Rehab Potential  Good    Current Impairments/barriers affecting progress:  cognitive deficits, dialysis, complex medical hx    OT Frequency  2x / week    OT Duration  12 weeks    OT Treatment/Interventions  Self-care/ADL training;Therapeutic exercise;Patient/family education;Splinting;Neuromuscular education;Balance training;Therapeutic activities;Functional Mobility Training;Energy conservation;Fluidtherapy;Ultrasound;Cryotherapy;DME and/or AE instruction;Manual Therapy;Passive range of motion;Cognitive remediation/compensation;Visual/perceptual remediation/compensation;Moist  Heat;Paraffin;Aquatic Therapy    Plan  memory / cognitive strategies, organization task, issue HEP if family present    Consulted and Agree with Plan of Care  Patient    Family Member Consulted  wife-[name]       Patient will benefit from skilled therapeutic intervention in order to improve the following deficits and impairments:  Abnormal gait, Decreased cognition, Decreased knowledge of use of DME, Increased edema, Pain, Impaired flexibility, Impaired sensation, Decreased coordination, Decreased mobility, Decreased activity tolerance, Decreased endurance, Decreased range of motion, Decreased strength, Impaired UE functional use, Impaired perceived functional ability, Difficulty walking, Decreased safety awareness, Decreased knowledge of precautions, Decreased balance  Visit Diagnosis: Other lack of coordination  Cognitive social or emotional deficit following cerebral infarction  Muscle weakness (generalized)    Problem List Patient Active Problem List   Diagnosis Date Noted  . Chronic hepatitis C without hepatic coma ([place]) 09/25/2017  . Diabetes mellitus type 2, uncomplicated ([place]) 75/30/0511  . Myocardial infarction ([place]) 09/25/2017  . Renal transplant recipient 09/25/2017  . Transfusion history 09/25/2017  . History of coronary artery disease 09/12/2016  . 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 MI 09/12/2016  . Aftercare following organ transplant 09/11/2016  . 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 At risk for infection transmitted from donor 06/16/2014  . Ureteral stent displacement ([place]) 06/10/2014  . Prophylactic antibiotic 06/09/2014  . Immunosuppression ([place]) 06/06/2014    Airam Runions 03/22/2018, 9:20 AM  [facility] [address] [place], [place], [address] Phone: [phone]   Fax:  [phone]  Name: Jonathan Sawa Sr. MRN: [id number] Date of Birth: 1951/01/16

## 2018-03-27 ENCOUNTER — Ambulatory Visit: Payer: Medicare Other | Admitting: Occupational Therapy

## 2018-03-27 ENCOUNTER — Ambulatory Visit: Payer: Medicare Other | Admitting: Physical Therapy

## 2018-03-27 ENCOUNTER — Telehealth: Payer: Self-pay | Admitting: Physical Therapy

## 2018-03-27 NOTE — Telephone Encounter (Signed)
Called patients home number due to missed OT and PT visits today. Spoke to his spouse. He had appt's at [facility] yesterday which caused him to miss dialysis and they scheduled it to be made up today. She forgot to call us and they do plan to be here for Thursday's appointments.   Willow Ora, PTA, [facility] [address], [place], [place] [address] [phone] 03/27/18, 9:42 AM

## 2018-03-29 ENCOUNTER — Ambulatory Visit: Payer: Medicare Other | Admitting: Occupational Therapy

## 2018-03-29 ENCOUNTER — Encounter: Payer: Self-pay | Admitting: Physical Therapy

## 2018-03-29 ENCOUNTER — Ambulatory Visit: Payer: Medicare Other | Admitting: Physical Therapy

## 2018-03-29 DIAGNOSIS — R2689 Other abnormalities of gait and mobility: Secondary | ICD-10-CM

## 2018-03-29 DIAGNOSIS — I69315 Cognitive social or emotional deficit following cerebral infarction: Secondary | ICD-10-CM

## 2018-03-29 DIAGNOSIS — R278 Other lack of coordination: Secondary | ICD-10-CM

## 2018-03-29 DIAGNOSIS — R2681 Unsteadiness on feet: Secondary | ICD-10-CM

## 2018-03-29 DIAGNOSIS — M6281 Muscle weakness (generalized): Secondary | ICD-10-CM

## 2018-03-29 NOTE — Patient Instructions (Signed)
Access Code: [email or web address]  URL: [email or web address]  Date: 03/29/2018  Prepared by: Misty Stanley   Exercises  Standing Marching - 10 reps - 2 sets - 1x daily - 3x weekly  Standing Hip Abduction with Counter Support - 10 reps - 2 sets - 1x daily - 3x weekly  Standing Hip Extension with Counter Support - 10 reps - 2 sets - 1x daily - 3x weekly  Standing Knee Flexion with Counter Support - 10 reps - 2 sets - 1x daily - 3x weekly  Side Stepping with Counter Support - 3 reps - 1x daily - 3x weekly  Heel Toe Raises with Unilateral Counter Support - 10 reps - 2 sets - 1x daily - 3x weekly  Standing Tandem Balance with Unilateral Counter Support - 3 sets - 12 second hold - 1x daily - 3x weekly  Standing Single Leg Stance with Counter Support - 2 sets - 12 seconds hold - 1x daily - 7x weekly  Standing Quarter Turn with Counter Support - 10 reps - 1x daily - 7x weekly  Squat with Chair and Counter Support - 10 reps - 2 sets - 1x daily - 7x weekly

## 2018-03-29 NOTE — Therapy (Signed)
[facility] [address] [place], [place], [address] Phone: [phone]   Fax:  [phone]  Physical Therapy Treatment  Patient Details  Name: Jonathan Torti Sr. MRN: [id number] Date of Birth: 25-Jul-1951 Referring Provider: Reather Laurence. Blanch Media   Encounter Date: 03/29/2018  PT End of Session - 03/29/18 1121    Visit Number  8    Number of Visits  17    Date for PT Re-Evaluation  05/14/18    Authorization Type  BCBS HRT Medicare Replacement-12 visits approved for PT; 10th visit progress note needed; [id number]    Authorization Time Period  02-12-18 through 04-12-18    Authorization - Visit Number  8    Authorization - Number of Visits  12    PT Start Time  [phone]   arrived late   PT Stop Time  1016    PT Time Calculation (min)  30 min    Activity Tolerance  Patient tolerated treatment well    Behavior During Therapy  [facility] for tasks assessed/performed       Past Medical History:  Diagnosis Date  . Diabetes mellitus without complication ([place])   . Hypertension   . Renal disorder 2015   right kidney transplant  . Stroke [facility])     Past Surgical History:  Procedure Laterality Date  . BACK SURGERY     Has had 2 back surgeries  . Depression    . HAND SURGERY Right   . HAND SURGERY    . KIDNEY TRANSPLANT     November 2015  . Memory loss    . NEPHRECTOMY TRANSPLANTED ORGAN      There were no vitals filed for this visit.  Subjective Assessment - 03/29/18 1010    Subjective  Pt late today.  Had doctor appointments earlier this week at [facility]; appointments went well.    Patient is accompained by:  Family member   wife, [name]   Pertinent History  2015-kidney transplant; 2017-ministrokes; port of R side of chest; fistula on L upper arm (No BP in LUE); Independent prior to hospitalization, 3 weeks in hospital>[facility] re[facility]>home    Patient Stated Goals  Pt's goal is to "learn how to walk again."  Wife's goal is  to get stronger and get balance better.                       [facility] Adult PT Treatment/Exercise - 03/29/18 1017      Ambulation/Gait   Ambulation/Gait  Yes    Ambulation/Gait Assistance  4: Min assist    Ambulation/Gait Assistance Details  gait with SBQC to assess safety with cane.  Intermittent cues to put all 4 points on floor and intermittent assistance to maintain upright trunk and balance    Ambulation Distance (Feet)  115 Feet    Assistive device  Small based quad cane    Gait Pattern  Step-through pattern;Decreased hip/knee flexion - left;Decreased dorsiflexion - left;Decreased weight shift to left    Ambulation Surface  Level;Indoor    Stairs  Yes    Stairs Assistance  4: Min assist    Stairs Assistance Details (indicate cue type and reason)  to simulate stairs at home with one rail and cane.  When ascending cane in LUE, rail in R; when descending cane in RUE, rail in LUE    Stair Management Technique  One rail Right;One rail Left;Step to pattern;Forwards;With cane    Number of Stairs  4  Height of Stairs  6           PT Education - 03/29/18 1120    Education Details  added to HEP; continue to ambulate with RW and bring cane in from home to practice with    Person(s) Educated  Patient    Methods  Explanation;Demonstration;Handout    Comprehension  Verbalized understanding;Returned demonstration;Need further instruction      Access Code: [email or web address]  URL: [email or web address]  Date: 03/29/2018  Prepared by: Misty Stanley   Exercises added and reviewed with pt today: Heel Toe Raises with Unilateral Counter Support - 10 reps - 2 sets - 1x daily - 3x weekly  Standing Tandem Balance with Unilateral Counter Support - 3 sets - 12 second hold - 1x daily - 3x weekly  Standing Single Leg Stance with Counter Support - 2 sets - 12 seconds hold - 1x daily - 7x weekly  Standing Quarter Turn with Counter Support - 10 reps - 1x daily - 7x weekly  Squat  with Chair and Counter Support - 10 reps - 2 sets - 1x daily - 7x weekly   PT Short Term Goals - 03/20/18 0954      PT SHORT TERM GOAL #1   Title  Pt will perform HEP with family's supervision for improved balance, strength, and gait.  TARGET (after waiting 1 week for auth):03/16/18    Time  4    Period  Weeks    Status  Achieved      PT SHORT TERM GOAL #2   Title  Pt will improve BERG Balance score to at least 32/56 for decreased fall risk.    Baseline  35/56 03/20/18    Time  4    Period  Weeks    Status  Achieved      PT SHORT TERM GOAL #3   Title  Pt will improve TUG to less than or equal to 22 seconds for decreased fall risk.    Baseline  32.1 with rollator 03/20/18    Time  4    Period  Weeks    Status  Not Met      PT SHORT TERM GOAL #4   Title  Pt will perform sit<>stand transfers, at least 8 of 10 reps with minimal to no UE support, for improved safety and efficiency with transfers.    Baseline  Requires minimal UE support (unable to perform without UE support)    Time  4    Period  Weeks    Status  Partially Met      PT SHORT TERM GOAL #5   Title  Pt/wife will verbalize understanding of fall prevention in home environment.    Time  4    Period  Weeks    Status  Achieved        PT Long Term Goals - 02/13/18 1058      PT LONG TERM GOAL #1   Title  Pt will improve gait velocity to at least 2.3 ft/sec for improved gait efficiency and safety.  TARGET 04/13/18    Time  8    Period  Weeks    Status  New    Target Date  04/13/18      PT LONG TERM GOAL #2   Title  Pt will improve Berg Balance score to at least 40/56 for decreased fall risk.    Time  8    Period  Weeks    Status  New    Target  Date  04/13/18      PT LONG TERM GOAL #3   Title  Pt will improve TUG score to less than or equal to 15 seconds for decreased fall risk.    Time  8    Period  Weeks    Status  New    Target Date  04/13/18      PT LONG TERM GOAL #4   Title  Pt will ambulate at least 1000  ft, indoor and outdoor surfaces, modified independently, for improved outdoor and community gait.    Time  8    Period  Weeks    Status  New    Target Date  04/13/18      PT LONG TERM GOAL #5   Title  Pt will ambulate household distances, 50-100 ft using cane, with supervision, for improved independence with gait.    Time  8    Period  Weeks    Status  New    Target Date  04/13/18      Additional Long Term Goals   Additional Long Term Goals  Yes      PT LONG TERM GOAL #6   Title  Pt/wife will verbalize plans for continued community fitness upon d/c from PT.    Time  8    Period  Weeks    Status  New    Target Date  04/13/18            Plan - 03/29/18 1123    Clinical Impression Statement  Pt arrived late for session.  Treatment session focused on addition of further standing balance and LE strengthening exercises for HEP.  Also performed gait assessment and training with quad cane over indoor surfaces and stairs.  Will continue to address in order to progress towards LTG.    Rehab Potential  Good    Clinical Impairments Affecting Rehab Potential  Cognitive communication deficit (wife present at eval, family supportive)    PT Frequency  2x / week    PT Duration  8 weeks   plus eval   PT Treatment/Interventions  ADLs/Self Care Home Management;Gait training;Functional mobility training;Therapeutic activities;Therapeutic exercise;Balance training;DME Instruction;Neuromuscular re-education;Patient/family education;Orthotic Fit/Training    PT Next Visit Plan  gait with quad cane?  standing balance with decreased UE support    Consulted and Agree with Plan of Care  Patient    Family Member Consulted          Patient will benefit from skilled therapeutic intervention in order to improve the following deficits and impairments:  Abnormal gait, Decreased balance, Decreased knowledge of use of DME, Decreased mobility, Decreased safety awareness, Difficulty walking, Decreased  strength  Visit Diagnosis: Muscle weakness (generalized)  Unsteadiness on feet  Other abnormalities of gait and mobility     Problem List Patient Active Problem List   Diagnosis Date Noted  . Chronic hepatitis C without hepatic coma ([place]) 09/25/2017  . Diabetes mellitus type 2, uncomplicated ([place]) 42/35/3614  . Myocardial infarction ([place]) 09/25/2017  . Renal transplant recipient 09/25/2017  . Transfusion history 09/25/2017  . History of coronary artery disease 09/12/2016  . 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 MI 09/12/2016  . Aftercare following organ transplant 09/11/2016  . 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 At risk for infection transmitted from donor 06/16/2014  . Ureteral stent displacement ([place]) 06/10/2014  . Prophylactic antibiotic 06/09/2014  . Immunosuppression ([place]) 06/06/2014    Rico Junker, PT, DPT 03/29/18  11:30 AM    [facility] [address] [place], [place], [address] Phone: [phone]   Fax:  [phone]  Name: Jonathan Cherne Sr. MRN: [id number] Date of Birth: 1951/04/03

## 2018-03-29 NOTE — Therapy (Signed)
[facility] [address] [place], [place], [address] Phone: [phone]   Fax:  [phone]  Occupational Therapy Treatment  Patient Details  Name: Jonathan Husby Sr. MRN: [id number] Date of Birth: 10/17/1950 Referring Provider: Brittany D. [place]   Encounter Date: 03/29/2018  OT End of Session - 03/29/18 1023    Visit Number  10    Number of Visits  25    Date for OT Re-Evaluation  05/14/18    Authorization Type  [employer]    Authorization Time Period  12 visits approved through 04/12/18    Authorization - Visit Number  10    Authorization - Number of Visits  12       Past Medical History:  Diagnosis Date  . Diabetes mellitus without complication ([name])   . Hypertension   . Renal disorder 2015   right kidney transplant  . Stroke ([name])     Past Surgical History:  Procedure Laterality Date  . BACK SURGERY     Has had 2 back surgeries  . Depression    . HAND SURGERY Right   . HAND SURGERY    . KIDNEY TRANSPLANT     November 2015  . Memory loss    . NEPHRECTOMY TRANSPLANTED ORGAN      There were no vitals filed for this visit.  Subjective Assessment - 03/29/18 1244    Pertinent History  2015-kidney transplant; 2017-ministrokes; port of R side of chest; fistula on L upper arm (No BP in LUE); ESRD, CVA, Hep C per hx    Patient Stated Goals  regain use of LUE    Currently in Pain?  No/denies           Treatment: Discussed activities pt is performing a t home. Pt's wife reports bathing pt since they were in a hurry today. Therapist discussed with pt/ wife importance of pt assisting with activities at home and perfroming ADLs with supervision. Pt folded towels in seated and standing with supervision.  Pt retrieved items from cabinets in the kitchen at a walker level with supervision. Therapist asked pt to try making a sandwich at home with his wife supervising. Cane exercises for shoulder flexion and  chest press 15 reps each, min v.c Picking up and stacking coins with LUE, min v.c Discussed plans for d/c next week.                  OT Short Term Goals - 03/29/18 1257      OT SHORT TERM GOAL #1   Title  Pt/ caregiver will be I with inital HEP.  due 03/15/18    Time  4    Period  Weeks    Status  Achieved      OT SHORT TERM GOAL #2   Title  Pt will perform UB dressing with setup/ supervision, LB dressing with min A.    Time  4    Period  Weeks    Status  Achieved      OT SHORT TERM GOAL #3   Title  Pt will use LUE as a non-dominant assist at least 25 % of the time for ADLs/ IADLS.    Baseline  currently pt does not use LUE functionally    Time  4    Period  Weeks    Status  Achieved   per pt report     OT SHORT TERM GOAL #4   Title  Pt will demonstrate improved   LUE fine motor coordination as evidenced by performing 9 hole peg test in 90 secs or less.    Baseline  RUE 49.59 secs, LUE  1 min 51 secs    Time  4    Period  Weeks    Status  On-going      OT SHORT TERM GOAL #5   Title  Pt / family will verbalize understanding of LUE postioning to minimize pain, edema and risk for injury.    Baseline  moderate edema in left hand    Time  4    Period  Weeks    Status  Deferred   Pt demonstrates decreased overall edema and pain in LUE     OT SHORT TERM GOAL #6   Title  Pt/ caregiver will verbalize understanding of compensatory strategies for short term memory and cognitive deficits.    Time  4    Period  Weeks    Status  On-going      OT SHORT TERM GOAL #7   Title  Pt will perform a simple task requiring organization and problem solving with 75% accuracy.    Time  4    Period  Weeks    Status  On-going      OT SHORT TERM GOAL #8   Title  Further assess cognition prn and add additional goals as needed    Time  4    Period  Weeks    Status  Deferred        OT Long Term Goals - 03/29/18 1258      OT LONG TERM GOAL #1   Title  Pt/ caregiver will be  I with updated HEP.    Status  On-going      OT LONG TERM GOAL #2   Title  Pt will perform all basic ADLS modified independently.    Status  Not Met      OT LONG TERM GOAL #3   Title  Pt will perform simple snack or beverage prep with supervision .    Status  On-going      OT LONG TERM GOAL #4   Title  Pt will use LUE as a non-dominant assist at least 50% of the time for ADLs/ IADLs.    Status  Achieved      OT LONG TERM GOAL #5   Title  Pt will demonstrate improved LUE fine motor coordination for ADLS as evidenced by performing 9-hole peg test in 60 secs or less.    Status  On-going      OT LONG TERM GOAL #6   Title  Pt will perfrom simple home management tasks with supervision demonstrating good safety awareness.    Status  Partially Met   folding laundry.           Plan - 03/29/18 1251    Clinical Impression Statement  Pt is progressing towards goals. Education perfromed with pt/ wife regarding importance of pt assisting more with ADLs and light home mnagement to maximize pt independence.    Occupational Profile and client history currently impacting functional performance   Prior to pt's CVA he was I with ADLS, driving and enjoyed exercise at [employer]. PMH ESRD, dialysis, hx of CVA, 2015-kidney transplant; 2017-ministrokes; port of R side of chest; fistula on L upper arm (No BP in LUE);     Occupational performance deficits (Please refer to evaluation for details):  IADL's;ADL's;Play;Leisure;Social Participation    Rehab Potential  Good  Current Impairments/barriers affecting progress:  cognitive deficits, dialysis, complex medical hx    OT Frequency  2x / week    OT Duration  8 weeks    OT Treatment/Interventions  Self-care/ADL training;Therapeutic exercise;Patient/family education;Splinting;Neuromuscular education;Balance training;Therapeutic activities;Functional Mobility Training;Energy conservation;Fluidtherapy;Ultrasound;Cryotherapy;DME and/or AE  instruction;Manual Therapy;Passive range of motion;Cognitive remediation/compensation;Visual/perceptual remediation/compensation;Moist Heat;Paraffin;Aquatic Therapy    Plan  anticipate d/c next week    Consulted and Agree with Plan of Care  Patient    Family Member Consulted  wife-[name]     Occupational Therapy Progress Note  Dates of Reporting Period: 02/13/18 to 03/29/18  Objective Reports of Subjective Statement: Pt is progressing towards goals with improving LUE functional use.   Goal Update: Pt is progressing towards all goals.  Plan: Anticipate d/c next week, pt/ wife aware.  Reason Skilled Services are Required: Pt can benefit from continued skilled occupational therapy to maximize pt's safety and I with ADLS/ IADLs and to maximize LUE functional use.  Patient will benefit from skilled therapeutic intervention in order to improve the following deficits and impairments:  Abnormal gait, Decreased cognition, Decreased knowledge of use of DME, Increased edema, Pain, Impaired flexibility, Impaired sensation, Decreased coordination, Decreased mobility, Decreased activity tolerance, Decreased endurance, Decreased range of motion, Decreased strength, Impaired UE functional use, Impaired perceived functional ability, Difficulty walking, Decreased safety awareness, Decreased knowledge of precautions, Decreased balance  Visit Diagnosis: Muscle weakness (generalized)  Other lack of coordination  Cognitive social or emotional deficit following cerebral infarction    Problem List Patient Active Problem List   Diagnosis Date Noted  . Chronic hepatitis C without hepatic coma ([place]) 09/25/2017  . Diabetes mellitus type 2, uncomplicated ([place]) 59/56/3875  . Myocardial infarction ([place]) 09/25/2017  . Renal transplant recipient 09/25/2017  . Transfusion history 09/25/2017  . History of coronary artery disease 09/12/2016  . 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 MI 09/12/2016  .  Aftercare following organ transplant 09/11/2016  . 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 At risk for infection transmitted from donor 06/16/2014  . Ureteral stent displacement ([place]) 06/10/2014  . Prophylactic antibiotic 06/09/2014  . Immunosuppression ([place]) 06/06/2014    Derion Kreiter 03/29/2018, 12:59 PM  [facility] [address] [place], [place], [address] Phone: [phone]   Fax:  [phone]  Name: Jonathan Blann Sr. MRN: [id number] Date of Birth: 09-11-1950

## 2018-04-03 ENCOUNTER — Encounter: Payer: Self-pay | Admitting: Occupational Therapy

## 2018-04-03 ENCOUNTER — Ambulatory Visit: Payer: Self-pay | Admitting: Physical Therapy

## 2018-04-03 ENCOUNTER — Ambulatory Visit: Payer: Medicare Other

## 2018-04-05 ENCOUNTER — Ambulatory Visit: Payer: Medicare Other | Admitting: Occupational Therapy

## 2018-04-05 ENCOUNTER — Encounter: Payer: Self-pay | Admitting: Physical Therapy

## 2018-04-05 ENCOUNTER — Ambulatory Visit: Payer: Medicare Other | Admitting: Physical Therapy

## 2018-04-05 DIAGNOSIS — R2681 Unsteadiness on feet: Secondary | ICD-10-CM

## 2018-04-05 DIAGNOSIS — R2689 Other abnormalities of gait and mobility: Secondary | ICD-10-CM

## 2018-04-05 DIAGNOSIS — R278 Other lack of coordination: Secondary | ICD-10-CM

## 2018-04-05 DIAGNOSIS — M6281 Muscle weakness (generalized): Secondary | ICD-10-CM | POA: Diagnosis not present

## 2018-04-05 NOTE — Therapy (Signed)
[facility] [address] [place], [place], [address] Phone: [phone]   Fax:  [phone]  Occupational Therapy Treatment  Patient Details  Name: Jonathan Bossi Sr. MRN: [id number] Date of Birth: [birth year] Referring Provider: Reather Laurence. Blanch Media   Encounter Date: 04/05/2018  OT End of Session - 04/05/18 0943    Visit Number  11    Number of Visits  25    Date for OT Re-Evaluation  05/14/18    Authorization Type  [employer] H[employer]    Authorization Time Period  12 visits approved through 04/12/18    Authorization - Visit Number  11    Authorization - Number of Visits  12    OT Start Time  0355    OT Stop Time  0935    OT Time Calculation (min)  40 min    Activity Tolerance  Patient tolerated treatment well    Behavior During Therapy  [facility] for tasks assessed/performed       Past Medical History:  Diagnosis Date  . Diabetes mellitus without complication ([place])   . Hypertension   . Renal disorder 2015   right kidney transplant  . Stroke [facility])     Past Surgical History:  Procedure Laterality Date  . BACK SURGERY     Has had 2 back surgeries  . Depression    . HAND SURGERY Right   . HAND SURGERY    . KIDNEY TRANSPLANT     November 2015  . Memory loss    . NEPHRECTOMY TRANSPLANTED ORGAN      There were no vitals filed for this visit.  Subjective Assessment - 04/05/18 0857    Subjective   I'm fine with wrapping up today. I got myself bathed and dressed this morning    Pertinent History  2015-kidney transplant; 2017-ministrokes; port of R side of chest; fistula on L upper arm (No BP in LUE); ESRD, CVA, Hep C per hx    Patient Stated Goals  regain use of LUE    Currently in Pain?  No/denies                   OT Treatments/Exercises (OP) - 04/05/18 0001      ADLs   Cooking  Pt made PB and jelly sandwich w/ supervision and min to mod cues for safety and organization/simple problem solving    ADL  Comments  Assessed remaining STG's/LTG's (see goal section) and progress to date. Pt wishes to d/c from O.T. today. Pt issued memory strategies and reviewed.              OT Education - 04/05/18 0941    Education Details  memory compensatory strategies    Person(s) Educated  Patient    Methods  Explanation;Handout    Comprehension  Verbalized understanding       OT Short Term Goals - 04/05/18 0943      OT SHORT TERM GOAL #1   Title  Pt/ caregiver will be I with inital HEP.  due 03/15/18    Time  4    Period  Weeks    Status  Achieved      OT SHORT TERM GOAL #2   Title  Pt will perform UB dressing with setup/ supervision, LB dressing with min A.    Time  4    Period  Weeks    Status  Achieved      OT SHORT TERM GOAL #3   Title  Pt will use LUE as a non-dominant assist at least 25 % of the time for ADLs/ IADLS.    Baseline  currently pt does not use LUE functionally    Time  4    Period  Weeks    Status  Achieved   per pt report     OT SHORT TERM GOAL #4   Title  Pt will demonstrate improved LUE fine motor coordination as evidenced by performing 9 hole peg test in 90 secs or less.    Baseline  RUE 49.59 secs, LUE  1 min 51 secs    Time  4    Period  Weeks    Status  Not Met   93 sec.      OT SHORT TERM GOAL #5   Title  Pt / family will verbalize understanding of LUE postioning to minimize pain, edema and risk for injury.    Baseline  moderate edema in left hand    Time  4    Period  Weeks    Status  Deferred   Pt demonstrates decreased overall edema and pain in LUE     OT SHORT TERM GOAL #6   Title  Pt/ caregiver will verbalize understanding of compensatory strategies for short term memory and cognitive deficits.    Time  4    Period  Weeks    Status  Achieved      OT SHORT TERM GOAL #7   Title  Pt will perform a simple task requiring organization and problem solving with 75% accuracy.    Time  4    Period  Weeks    Status  Not Met      OT SHORT TERM  GOAL #8   Title  Further assess cognition prn and add additional goals as needed    Time  4    Period  Weeks    Status  Deferred        OT Long Term Goals - 04/05/18 0944      OT LONG TERM GOAL #1   Title  Pt/ caregiver will be I with updated HEP.    Status  Achieved      OT LONG TERM GOAL #2   Title  Pt will perform all basic ADLS modified independently.    Status  Not Met      OT LONG TERM GOAL #3   Title  Pt will perform simple snack or beverage prep with supervision .    Status  Achieved   met in clinic, however unsure if pt is doing at home     OT [place] #4   Title  Pt will use LUE as a non-dominant assist at least 50% of the time for ADLs/ IADLs.    Status  Achieved      OT LONG TERM GOAL #5   Title  Pt will demonstrate improved LUE fine motor coordination for ADLS as evidenced by performing 9-hole peg test in 60 secs or less.    Status  Not Met   93 sec.      OT LONG TERM GOAL #6   Title  Pt will perfrom simple home management tasks with supervision demonstrating good safety awareness.    Status  Partially Met   folding laundry.           Plan - 04/05/18 0946    Clinical Impression Statement  Pt has met some STG's and LTG's. See goal section for details  Occupational Profile and client history currently impacting functional performance   Prior to pt's CVA he was I with ADLS, driving and enjoyed exercise at [employer]. PMH ESRD, dialysis, hx of CVA, 2015-kidney transplant; 2017-ministrokes; port of R side of chest; fistula on L upper arm (No BP in LUE);     Occupational performance deficits (Please refer to evaluation for details):  IADL's;ADL's;Play;Leisure;Social Participation    Rehab Potential  Good    Current Impairments/barriers affecting progress:  cognitive deficits, dialysis, complex medical hx    OT Treatment/Interventions  Self-care/ADL training;Therapeutic exercise;Patient/family education;Splinting;Neuromuscular education;Balance  training;Therapeutic activities;Functional Mobility Training;Energy conservation;Fluidtherapy;Ultrasound;Cryotherapy;DME and/or AE instruction;Manual Therapy;Passive range of motion;Cognitive remediation/compensation;Visual/perceptual remediation/compensation;Moist Heat;Paraffin;Aquatic Therapy    Plan  D/C O.T.     Consulted and Agree with Plan of Care  Patient       Patient will benefit from skilled therapeutic intervention in order to improve the following deficits and impairments:  Abnormal gait, Decreased cognition, Decreased knowledge of use of DME, Increased edema, Pain, Impaired flexibility, Impaired sensation, Decreased coordination, Decreased mobility, Decreased activity tolerance, Decreased endurance, Decreased range of motion, Decreased strength, Impaired UE functional use, Impaired perceived functional ability, Difficulty walking, Decreased safety awareness, Decreased knowledge of precautions, Decreased balance  Visit Diagnosis: Other lack of coordination  Unsteadiness on feet    Problem List Patient Active Problem List   Diagnosis Date Noted  . Chronic hepatitis C without hepatic coma ([place]) 09/25/2017  . Diabetes mellitus type 2, uncomplicated ([place]) 26/20/3559  . Myocardial infarction ([place]) 09/25/2017  . Renal transplant recipient 09/25/2017  . Transfusion history 09/25/2017  . History of coronary artery disease 09/12/2016  . 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 MI 09/12/2016  . Aftercare following organ transplant 09/11/2016  . 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 At risk for infection transmitted from donor 06/16/2014  . Ureteral stent displacement ([place]) 06/10/2014  . Prophylactic antibiotic 06/09/2014  . Immunosuppression ([place]) 06/06/2014    OCCUPATIONAL THERAPY DISCHARGE SUMMARY  Visits from Start of Care: 11  Current functional level related to goals / functional outcomes: See above for details   Remaining  deficits: Cognition including insight, memory, and problem Engineer, materials / Equipment: HEP's, memory strategies, Pt/family education re: ADLS  Plan: Patient agrees to discharge.  Patient goals were partially met. Patient is being discharged due to lack of progress.  (Poor carryover of recommendations at home). Pt wished to d/c today vs. Next week?????        Carey Bullocks, OTR/L 04/05/2018, 10:23 AM  [facility] [address] [place], [place], [address] Phone: [phone]   Fax:  [phone]  Name: Jonathan Sikorski Sr. MRN: [id number] Date of Birth: 1951-01-09

## 2018-04-05 NOTE — Therapy (Signed)
[facility] [address] [place], [place], [address] Phone: [phone]   Fax:  [phone]  Physical Therapy Treatment  Patient Details  Name: Jonathan Manton Sr. MRN: [id number] Date of Birth: 07/15/1951 Referring Provider: Brittany D. [place]   Encounter Date: 04/05/2018  PT End of Session - 04/05/18 1303    Visit Number  9    Number of Visits  17    Date for PT Re-Evaluation  05/14/18    Authorization Type  [employer] HRT Medicare Replacement-12 visits approved for PT; 10th visit progress note needed; [id number]    Authorization Time Period  02-12-18 through 04-12-18    Authorization - Visit Number  9    Authorization - Number of Visits  12    PT Start Time  0931    PT Stop Time  1015    PT Time Calculation (min)  44 min    Equipment Utilized During Treatment  Gait belt    Activity Tolerance  Patient tolerated treatment well    Behavior During Therapy  WFL for tasks assessed/performed       Past Medical History:  Diagnosis Date  . Diabetes mellitus without complication ([name])   . Hypertension   . Renal disorder 2015   right kidney transplant  . Stroke ([name])     Past Surgical History:  Procedure Laterality Date  . BACK SURGERY     Has had 2 back surgeries  . Depression    . HAND SURGERY Right   . HAND SURGERY    . KIDNEY TRANSPLANT     November 2015  . Memory loss    . NEPHRECTOMY TRANSPLANTED ORGAN      There were no vitals filed for this visit.  Subjective Assessment - 04/05/18 0933    Subjective  No changes, no falls.    Patient is accompained by:  Family member   wife, [name]   Pertinent History  2015-kidney transplant; 2017-ministrokes; port of R side of chest; fistula on L upper arm (No BP in LUE); Independent prior to hospitalization, 3 weeks in hospital>[facility]>home    Patient Stated Goals  Pt's goal is to "learn how to walk again."  Wife's goal is to get stronger and get balance  better.    Currently in Pain?  No/denies                       [facility] Adult PT Treatment/Exercise - 04/05/18 0956      Ambulation/Gait   Ambulation/Gait  Yes    Ambulation/Gait Assistance  4: Min guard    Ambulation/Gait Assistance Details  Gait x 250 ft with SPC with rubber quad tip base (as pt reports this is what he has-and is using-at home)    Ambulation Distance (Feet)  115 Feet   with SBQC, then additional 200 ft SPC with rubber tip   Assistive device  Small based quad cane   single cane with rubber quad tip base   Gait Pattern  Step-through pattern;Decreased hip/knee flexion - left;Decreased dorsiflexion - left;Decreased weight shift to left    Ambulation Surface  Level;Indoor    Gait velocity  17.47 sec with SPC and rubber tip (1.88 ft/sec)    Gait Comments  With gait trial using SPC and rubber quad tip, pt requires cues for upright posture; increased recurvatum noted and decreased L foot clearance noted towards end of gait.                 PT Short Term Goals - 03/20/18 0954      PT SHORT TERM GOAL #1   Title  Pt will perform HEP with family's supervision for improved balance, strength, and gait.  TARGET (after waiting 1 week for auth):03/16/18    Time  4    Period  Weeks    Status  Achieved      PT SHORT TERM GOAL #2   Title  Pt will improve BERG Balance score to at least 32/56 for decreased fall risk.    Baseline  35/56 03/20/18    Time  4    Period  Weeks    Status  Achieved      PT SHORT TERM GOAL #3   Title  Pt will improve TUG to less than or equal to 22 seconds for decreased fall risk.    Baseline  32.1 with rollator 03/20/18    Time  4    Period  Weeks    Status  Not Met      PT SHORT TERM GOAL #4   Title  Pt will perform sit<>stand transfers, at least 8 of 10 reps with minimal to no UE support, for improved safety and efficiency with transfers.    Baseline  Requires minimal UE support (unable to perform without UE support)    Time  4     Period  Weeks    Status  Partially Met      PT SHORT TERM GOAL #5   Title  Pt/wife will verbalize understanding of fall prevention in home environment.    Time  4    Period  Weeks    Status  Achieved        PT Long Term Goals - 04/05/18 1308      PT LONG TERM GOAL #1   Title  Pt will improve gait velocity to at least 2.3 ft/sec for improved gait efficiency and safety.  TARGET 04/13/18    Time  8    Period  Weeks    Status  On-going      PT LONG TERM GOAL #2   Title  Pt will improve Berg Balance score to at least 40/56 for decreased fall risk.    Time  8    Period  Weeks    Status  On-going      PT LONG TERM GOAL #3   Title  Pt will improve TUG score to less than or equal to 15 seconds for decreased fall risk.    Time  8    Period  Weeks    Status  On-going      PT LONG TERM GOAL #4   Title  Pt will ambulate at least 1000 ft, indoor and outdoor surfaces, modified independently, for improved outdoor and community gait.    Time  8    Period  Weeks    Status  On-going      PT LONG TERM GOAL #5   Title  Pt will ambulate household distances, 50-100 ft using cane, with supervision, for improved independence with gait.    Time  8    Period  Weeks    Status  On-going      PT LONG TERM GOAL #6   Title  Pt/wife will verbalize plans for continued community fitness upon d/c from PT.    Time  8    Period  Weeks    Status  New              Plan - 04/05/18 1304    Clinical Impression Statement  Skilled PT session focused on review of full HEP, including updates from last session, with pt able to return demo with instructions read by PT.  Worked on Personnel officer with [facility] and SPC with rubber tip (pt states he has this at home-and is using it in home), with pt having no LOB during today's session, but with increased walking distance, he is noted to have increased L knee recurvatum and decreased foot clearance on LLE.  He likely is able to ambulate safely short distances with  cane, but will continue to need rollator for longer distances and outdoor surfaces.    Rehab Potential  Good    Clinical Impairments Affecting Rehab Potential  Cognitive communication deficit (wife present at eval, family supportive)    PT Frequency  2x / week    PT Duration  8 weeks   plus eval   PT Treatment/Interventions  ADLs/Self Care Home Management;Gait training;Functional mobility training;Therapeutic activities;Therapeutic exercise;Balance training;DME Instruction;Neuromuscular re-education;Patient/family education;Orthotic Fit/Training    PT Next Visit Plan  Gait safety recommendations as far as assistive device with wife present; check LTGs and plan for d/c next week    Consulted and Agree with Plan of Care  Patient    Family Member Consulted        PLAN:  Will need 10th visit progress note next visit.  Patient will benefit from skilled therapeutic intervention in order to improve the following deficits and impairments:  Abnormal gait, Decreased balance, Decreased knowledge of use of DME, Decreased mobility, Decreased safety awareness, Difficulty walking, Decreased strength  Visit Diagnosis: Other abnormalities of gait and mobility  Unsteadiness on feet     Problem List Patient Active Problem List   Diagnosis Date Noted  . Chronic hepatitis C without hepatic coma ([place]) 09/25/2017  . Diabetes mellitus type 2, uncomplicated ([place]) 38/45/3646  . Myocardial infarction ([place]) 09/25/2017  . Renal transplant recipient 09/25/2017  . Transfusion history 09/25/2017  . History of coronary artery disease 09/12/2016  . 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 MI 09/12/2016  . Aftercare following organ transplant 09/11/2016  . 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 At risk for infection transmitted from donor 06/16/2014  . Ureteral stent displacement ([place]) 06/10/2014  . Prophylactic antibiotic 06/09/2014  . Immunosuppression ([place]) 06/06/2014     Rilyn Upshaw W. 04/05/2018, 1:10 PM Frazier Butt., PT  [facility] [address] [place], [place], [address] Phone: [phone]   Fax:  [phone]  Name: Jonathan Gildersleeve Sr. MRN: [id number] Date of Birth: 1951-06-23

## 2018-04-05 NOTE — Patient Instructions (Signed)

## 2018-04-10 ENCOUNTER — Ambulatory Visit: Payer: Medicare Other | Attending: Registered Nurse | Admitting: Physical Therapy

## 2018-04-10 ENCOUNTER — Encounter: Payer: Self-pay | Admitting: Physical Therapy

## 2018-04-10 ENCOUNTER — Encounter: Payer: Self-pay | Admitting: Occupational Therapy

## 2018-04-10 DIAGNOSIS — R2681 Unsteadiness on feet: Secondary | ICD-10-CM

## 2018-04-10 DIAGNOSIS — R2689 Other abnormalities of gait and mobility: Secondary | ICD-10-CM | POA: Diagnosis present

## 2018-04-10 NOTE — Therapy (Signed)
[facility] [address] [place], [place], [address] Phone: [phone]   Fax:  [phone]  Physical Therapy Treatment  Patient Details  Name: Jonathan Wiley Sr. MRN: [id number] Date of Birth: 1950-09-22 Referring Provider: Reather Laurence. Blanch Media   Encounter Date: 04/10/2018  PT End of Session - 04/10/18 1157    Visit Number  10    Number of Visits  17    Date for PT Re-Evaluation  05/14/18    Authorization Type  [employer] HRT Medicare Replacement-12 visits approved for PT; 10th visit progress note needed; [id number]    Authorization Time Period  02-12-18 through 04-12-18    Authorization - Visit Number  9    Authorization - Number of Visits  12    PT Start Time  1019    PT Stop Time  1100    PT Time Calculation (min)  41 min    Equipment Utilized During Treatment  Gait belt    Activity Tolerance  Patient tolerated treatment well    Behavior During Therapy  WFL for tasks assessed/performed       Past Medical History:  Diagnosis Date  . Diabetes mellitus without complication ([place])   . Hypertension   . Renal disorder 2015   right kidney transplant  . Stroke [facility])     Past Surgical History:  Procedure Laterality Date  . BACK SURGERY     Has had 2 back surgeries  . Depression    . HAND SURGERY Right   . HAND SURGERY    . KIDNEY TRANSPLANT     November 2015  . Memory loss    . NEPHRECTOMY TRANSPLANTED ORGAN      There were no vitals filed for this visit.  Subjective Assessment - 04/10/18 1020    Subjective  I usually get weak after my dialysis.  No reported falls since last PT session    Patient is accompained by:  Family member   wife, [name]   Pertinent History  2015-kidney transplant; 2017-ministrokes; port of R side of chest; fistula on L upper arm (No BP in LUE); Independent prior to hospitalization, 3 weeks in hospital>[facility]>home    Patient Stated Goals  Pt's goal is to "learn how to walk  again."  Wife's goal is to get stronger and get balance better.    Currently in Pain?  No/denies                       [facility] Adult PT Treatment/Exercise - 04/10/18 0001      Ambulation/Gait   Ambulation/Gait  Yes    Ambulation/Gait Assistance  5: Supervision    Ambulation/Gait Assistance Details  Pt brought in his tri-tip [place] today, and PT adjusted to lower, more appropriate height.  Practiced short distance gait with turns around furniture with supervision, no LOB.    Ambulation Distance (Feet)  115 Feet   x 2   Assistive device  Rollator;Other (Comment)   Pt's tri-tip Hurrycane   Gait Pattern  Step-through pattern;Decreased hip/knee flexion - left;Decreased dorsiflexion - left;Decreased weight shift to left;Left genu recurvatum    Ambulation Surface  Level;Indoor    Gait velocity  17.34 sec (1.89 ft/sec) with cane; 15.23 with rollator (2.15 ft/sec)    Gait Comments  Attempted gait with cane with environmental scanning; pt needs to stop to look for objects while using cane.      Berg Balance Test   Sit to Stand  Able to  stand without using hands and stabilize independently    Standing Unsupported  Able to stand 2 minutes with supervision    Sitting with Back Unsupported but Feet Supported on Floor or Stool  Able to sit safely and securely 2 minutes    Stand to Sit  Sits safely with minimal use of hands    Transfers  Able to transfer safely, definite need of hands    Standing Unsupported with Eyes Closed  Able to stand 10 seconds with supervision    Standing Ubsupported with Feet Together  Able to place feet together independently and stand for 1 minute with supervision    From Standing, Reach Forward with Outstretched Arm  Can reach forward >12 cm safely (5")    From Standing Position, Pick up Object from [place] to pick up shoe safely and easily    From Standing Position, Turn to Look Behind Over each Shoulder  Looks behind one side only/other side shows less  weight shift    Turn 360 Degrees  Able to turn 360 degrees safely but slowly    Standing Unsupported, Alternately Place Feet on Step/Stool  Able to complete >2 steps/needs minimal assist    Standing Unsupported, One Foot in Front  Able to take small step independently and hold 30 seconds    Standing on One Leg  Unable to try or needs assist to prevent fall    Total Score  39      Timed Up and Go Test   TUG  Normal TUG    Normal TUG (seconds)  43   43.28 rollator; 34.78, 34.66            PT Education - 04/10/18 1156    Education Details  Educated patient on objective measures scores from [place], TUG, and gait velocity, including pt continueing to be at risk for falls; educated in use of rollator for long distances, cane for indoor, household distances with supervision.  Requested wife come in for next visit for education prior to d/c    Person(s) Educated  Patient    Methods  Explanation    Comprehension  Verbalized understanding       PT Short Term Goals - 03/20/18 0954      PT SHORT TERM GOAL #1   Title  Pt will perform HEP with family's supervision for improved balance, strength, and gait.  TARGET (after waiting 1 week for auth):03/16/18    Time  4    Period  Weeks    Status  Achieved      PT SHORT TERM GOAL #2   Title  Pt will improve BERG Balance score to at least 32/56 for decreased fall risk.    Baseline  35/56 03/20/18    Time  4    Period  Weeks    Status  Achieved      PT SHORT TERM GOAL #3   Title  Pt will improve TUG to less than or equal to 22 seconds for decreased fall risk.    Baseline  32.1 with rollator 03/20/18    Time  4    Period  Weeks    Status  Not Met      PT SHORT TERM GOAL #4   Title  Pt will perform sit<>stand transfers, at least 8 of 10 reps with minimal to no UE support, for improved safety and efficiency with transfers.    Baseline  Requires minimal UE support (unable to perform without UE support)  Time  4    Period  Weeks    Status   Partially Met      PT SHORT TERM GOAL #5   Title  Pt/wife will verbalize understanding of fall prevention in home environment.    Time  4    Period  Weeks    Status  Achieved        PT Long Term Goals - 04/10/18 1158      PT LONG TERM GOAL #1   Title  Pt will improve gait velocity to at least 2.3 ft/sec for improved gait efficiency and safety.  TARGET 04/13/18    Baseline  2.15 ft/sec with rollator; 1.89 ft/sec with cane 04/10/18    Time  8    Period  Weeks    Status  Not Met      PT LONG TERM GOAL #2   Title  Pt will improve Berg Balance score to at least 40/56 for decreased fall risk.    Baseline  39/56 04/10/18    Time  8    Period  Weeks    Status  Not Met      PT LONG TERM GOAL #3   Title  Pt will improve TUG score to less than or equal to 15 seconds for decreased fall risk.    Baseline  34.66 with cane; >40sec with rollator    Time  8    Period  Weeks    Status  Not Met      PT LONG TERM GOAL #4   Title  Pt will ambulate at least 1000 ft, indoor and outdoor surfaces, modified independently, for improved outdoor and community gait.    Time  8    Period  Weeks    Status  On-going      PT LONG TERM GOAL #5   Title  Pt will ambulate household distances, 50-100 ft using cane, with supervision, for improved independence with gait.    Time  8    Period  Weeks    Status  Achieved      PT LONG TERM GOAL #6   Title  Pt/wife will verbalize plans for continued community fitness upon d/c from PT.    Time  8    Period  Weeks    Status  New            Plan - 04/10/18 1159    Clinical Impression Statement  10th visit progress note:  Pt seen beginning 02/13/18, with 10th visit today, 04/10/18.  Began assessing LTGs today, with pt not meeting LTG 1 for gait velocity, LTG 2 for Merrilee Jansky (nearly met, as pt has improved Berg from 25>25>39/56); LTG 3 not met for TUG score, as pt continues to take >30-40 seconds with either cane or rollator.  Pt has met LTG for short distance gait with  cane.  Pt remains at fall risk per TUG, Berg, and gait velocity scores; PT requested that pt ask wife to come into therapy next visit for discharge instructions.    Rehab Potential  Good    Clinical Impairments Affecting Rehab Potential  Cognitive communication deficit (wife present at eval, family supportive)    PT Frequency  2x / week    PT Duration  8 weeks   plus eval   PT Treatment/Interventions  ADLs/Self Care Home Management;Gait training;Functional mobility training;Therapeutic activities;Therapeutic exercise;Balance training;DME Instruction;Neuromuscular re-education;Patient/family education;Orthotic Fit/Training    PT Next Visit Plan  Check remaining goals, plan for d/c next visit;  requested wife be present for d/c instructions    Consulted and Agree with Plan of Care  Patient    Family Member Consulted          Patient will benefit from skilled therapeutic intervention in order to improve the following deficits and impairments:  Abnormal gait, Decreased balance, Decreased knowledge of use of DME, Decreased mobility, Decreased safety awareness, Difficulty walking, Decreased strength  Visit Diagnosis: Unsteadiness on feet  Other abnormalities of gait and mobility     Problem List Patient Active Problem List   Diagnosis Date Noted  . Chronic hepatitis C without hepatic coma ([place]) 09/25/2017  . Diabetes mellitus type 2, uncomplicated ([place]) 94/50/3888  . Myocardial infarction ([place]) 09/25/2017  . Renal transplant recipient 09/25/2017  . Transfusion history 09/25/2017  . History of coronary artery disease 09/12/2016  . 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 MI 09/12/2016  . Aftercare following organ transplant 09/11/2016  . 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 At risk for infection transmitted from donor 06/16/2014  . Ureteral stent displacement ([place]) 06/10/2014  . Prophylactic antibiotic 06/09/2014  . Immunosuppression ([place]) 06/06/2014     Makennah Omura W. 04/10/2018, 12:08 PM  Frazier Butt., PT   [facility] [address] [place], [place], [address] Phone: [phone]   Fax:  [phone]  Name: Jonathan Overbaugh Sr. MRN: [id number] Date of Birth: 1951-06-26

## 2018-04-12 ENCOUNTER — Telehealth: Payer: Self-pay | Admitting: Physical Therapy

## 2018-04-12 ENCOUNTER — Ambulatory Visit: Payer: Medicare Other | Admitting: Physical Therapy

## 2018-04-12 ENCOUNTER — Encounter: Payer: Self-pay | Admitting: Occupational Therapy

## 2018-04-12 NOTE — Telephone Encounter (Signed)
Called patient due to no-showing for last PT appointment scheduled today at 10:15.  Spoke with son, who states pt had to go to dialysis today instead of tomorrow due to appt at [facility] tomorrow; that is why he is not at therapy.  Requested that patient/wife call to speak with PT to discuss over the phone d/c plans and decide if last appointment needs to be rescheduled.  Mady Haagensen, PT 04/12/18 10:35 AM Phone: [phone] Fax: [phone]

## 2018-05-07 ENCOUNTER — Ambulatory Visit: Payer: Medicare Other

## 2018-05-13 DIAGNOSIS — D631 Anemia in chronic kidney disease: Secondary | ICD-10-CM | POA: Insufficient documentation

## 2018-05-15 DIAGNOSIS — Z992 Dependence on renal dialysis: Secondary | ICD-10-CM | POA: Insufficient documentation

## 2018-05-15 DIAGNOSIS — T8612 Kidney transplant failure: Secondary | ICD-10-CM | POA: Insufficient documentation

## 2018-07-16 ENCOUNTER — Ambulatory Visit: Payer: Medicare Other | Admitting: Podiatry

## 2018-07-16 ENCOUNTER — Encounter: Payer: Self-pay | Admitting: Podiatry

## 2018-07-16 DIAGNOSIS — M79674 Pain in right toe(s): Secondary | ICD-10-CM | POA: Diagnosis not present

## 2018-07-16 DIAGNOSIS — E1140 Type 2 diabetes mellitus with diabetic neuropathy, unspecified: Secondary | ICD-10-CM | POA: Diagnosis not present

## 2018-07-16 DIAGNOSIS — L84 Corns and callosities: Secondary | ICD-10-CM

## 2018-07-16 DIAGNOSIS — D689 Coagulation defect, unspecified: Secondary | ICD-10-CM

## 2018-07-16 DIAGNOSIS — E1149 Type 2 diabetes mellitus with other diabetic neurological complication: Secondary | ICD-10-CM

## 2018-07-16 DIAGNOSIS — B351 Tinea unguium: Secondary | ICD-10-CM

## 2018-07-16 DIAGNOSIS — R1084 Generalized abdominal pain: Secondary | ICD-10-CM | POA: Insufficient documentation

## 2018-07-16 DIAGNOSIS — M79675 Pain in left toe(s): Secondary | ICD-10-CM | POA: Diagnosis not present

## 2018-07-18 NOTE — Progress Notes (Signed)
Subjective:   Patient ID: Jonathan Alexandria Sr., male   DOB: 67 y.o.   MRN: [id number]   HPI Patient presents with lesions on the bottom of both feet that are sore and long-term diabetes with neuropathy and thick yellow brittle nails that are sore bilateral.  Also is on blood thinner   ROS      Objective:  Physical Exam  No change neurovascular status the patient found to have thick yellow brittle nailbeds 1-5 both feet that are painful and is noted to have corns and calluses plantar aspect fifth metatarsal bilateral that are moderately tender when palpated     Assessment:  Mycotic nail infection with pain 1-5 both feet and lesions bilateral with at risk factors     Plan:  H&P condition reviewed and debrided lesions today and nailbeds with no iatrogenic bleeding and will be seen back for routine care

## 2018-08-17 DIAGNOSIS — E162 Hypoglycemia, unspecified: Secondary | ICD-10-CM | POA: Insufficient documentation

## 2018-09-25 ENCOUNTER — Ambulatory Visit (INDEPENDENT_AMBULATORY_CARE_PROVIDER_SITE_OTHER): Payer: Medicare Other | Admitting: Orthotics

## 2018-09-25 DIAGNOSIS — E1149 Type 2 diabetes mellitus with other diabetic neurological complication: Secondary | ICD-10-CM | POA: Diagnosis not present

## 2018-09-25 DIAGNOSIS — L97401 Non-pressure chronic ulcer of unspecified heel and midfoot limited to breakdown of skin: Secondary | ICD-10-CM

## 2018-09-25 DIAGNOSIS — E1140 Type 2 diabetes mellitus with diabetic neuropathy, unspecified: Secondary | ICD-10-CM | POA: Diagnosis not present

## 2018-09-25 DIAGNOSIS — L84 Corns and callosities: Secondary | ICD-10-CM

## 2018-09-25 NOTE — Progress Notes (Signed)

## 2018-10-29 ENCOUNTER — Encounter: Payer: Self-pay | Admitting: Physical Therapy

## 2018-10-29 NOTE — Therapy (Signed)
[facility] [address] [place], [place], [address] Phone: [phone]   Fax:  [phone]  Patient Details  Name: Jonathan [name] Sr. MRN: [id number] Date of Birth: [birth year] Referring Provider:  No ref. provider found  Encounter Date: 10/29/2018  PHYSICAL THERAPY DISCHARGE SUMMARY  Visits from Start of Care: 10 (last visit was 04/10/18)  Current functional level related to goals / functional outcomes: PT Long Term Goals - 04/10/18 1158      PT LONG TERM GOAL #1   Title  Pt will improve gait velocity to at least 2.3 ft/sec for improved gait efficiency and safety.  TARGET 04/13/18    Baseline  2.15 ft/sec with rollator; 1.89 ft/sec with cane 04/10/18    Time  8    Period  Weeks    Status  Not Met      PT LONG TERM GOAL #2   Title  Pt will improve Berg Balance score to at least 40/56 for decreased fall risk.    Baseline  39/56 04/10/18    Time  8    Period  Weeks    Status  Not Met      PT LONG TERM GOAL #3   Title  Pt will improve TUG score to less than or equal to 15 seconds for decreased fall risk.    Baseline  34.66 with cane; >40sec with rollator    Time  8    Period  Weeks    Status  Not Met      PT LONG TERM GOAL #4   Title  Pt will ambulate at least 1000 ft, indoor and outdoor surfaces, modified independently, for improved outdoor and community gait.    Time  8    Period  Weeks    Status  On-going      PT LONG TERM GOAL #5   Title  Pt will ambulate household distances, 50-100 ft using cane, with supervision, for improved independence with gait.    Time  8    Period  Weeks    Status  Achieved      PT LONG TERM GOAL #6   Title  Pt/wife will verbalize plans for continued community fitness upon d/c from PT.    Time  8    Period  Weeks    Status  New      PT Short Term Goals - 03/20/18 0954      PT SHORT TERM GOAL #1   Title  Pt will perform HEP with family's supervision for improved balance, strength,  and gait.  TARGET (after waiting 1 week for auth):03/16/18    Time  4    Period  Weeks    Status  Achieved      PT SHORT TERM GOAL #2   Title  Pt will improve BERG Balance score to at least 32/56 for decreased fall risk.    Baseline  35/56 03/20/18    Time  4    Period  Weeks    Status  Achieved      PT SHORT TERM GOAL #3   Title  Pt will improve TUG to less than or equal to 22 seconds for decreased fall risk.    Baseline  32.1 with rollator 03/20/18    Time  4    Period  Weeks    Status  Not Met      PT SHORT TERM GOAL #4   Title  Pt will perform sit<>stand transfers, at  least 8 of 10 reps with minimal to no UE support, for improved safety and efficiency with transfers.    Baseline  Requires minimal UE support (unable to perform without UE support)    Time  4    Period  Weeks    Status  Partially Met      PT SHORT TERM GOAL #5   Title  Pt/wife will verbalize understanding of fall prevention in home environment.    Time  4    Period  Weeks    Status  Achieved      LTGs not fully able to be assessed due to pt not showing for final PT visit.   Remaining deficits: Balance, gait   Education / Equipment: HEP, fall prevention  Plan: Patient agrees to discharge.  Patient goals were not met. Patient is being discharged due to not returning since the last visit.  ?????       Melvie Paglia W. 10/29/2018, 10:51 AM Mady Haagensen, PT 10/29/18 10:53 AM Phone: [phone] Fax: [facility] [address] [place], [place], [address] Phone: [phone]   Fax:  [phone]

## 2018-12-04 DIAGNOSIS — I132 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 kidney disease, or end stage renal disease: Secondary | ICD-10-CM | POA: Insufficient documentation

## 2019-02-25 ENCOUNTER — Other Ambulatory Visit: Payer: Self-pay | Admitting: Podiatry

## 2019-02-25 ENCOUNTER — Encounter: Payer: Self-pay | Admitting: Podiatry

## 2019-02-25 ENCOUNTER — Other Ambulatory Visit: Payer: Self-pay

## 2019-02-25 ENCOUNTER — Ambulatory Visit (INDEPENDENT_AMBULATORY_CARE_PROVIDER_SITE_OTHER): Payer: Medicare Other

## 2019-02-25 ENCOUNTER — Ambulatory Visit: Payer: Medicare Other | Admitting: Podiatry

## 2019-02-25 VITALS — Temp 98.4°F

## 2019-02-25 DIAGNOSIS — M79671 Pain in right foot: Secondary | ICD-10-CM

## 2019-02-25 DIAGNOSIS — N186 End stage renal disease: Secondary | ICD-10-CM | POA: Insufficient documentation

## 2019-02-25 DIAGNOSIS — M2042 Other hammer toe(s) (acquired), left foot: Secondary | ICD-10-CM

## 2019-02-25 DIAGNOSIS — M79674 Pain in right toe(s): Secondary | ICD-10-CM

## 2019-02-25 DIAGNOSIS — B351 Tinea unguium: Secondary | ICD-10-CM | POA: Diagnosis not present

## 2019-02-25 DIAGNOSIS — I5022 Chronic systolic (congestive) heart failure: Secondary | ICD-10-CM | POA: Insufficient documentation

## 2019-02-25 DIAGNOSIS — L84 Corns and callosities: Secondary | ICD-10-CM

## 2019-02-25 DIAGNOSIS — D689 Coagulation defect, unspecified: Secondary | ICD-10-CM | POA: Diagnosis not present

## 2019-02-25 DIAGNOSIS — M79675 Pain in left toe(s): Secondary | ICD-10-CM | POA: Diagnosis not present

## 2019-02-26 NOTE — Progress Notes (Signed)
Subjective:   Patient ID: Jonathan Alexandria Sr., male   DOB: 68 y.o.   MRN: [id number]   HPI Patient admits that he should have been here a long time ago when he does have relatively significant diabetes with lesion underneath the left first metatarsal and thick yellow brittle nailbeds 1-5 both feet that he cannot take care of   ROS      Objective:  Physical Exam  Neurovascular status unchanged with thick yellow brittle nailbeds 1-5 both feet and large keratotic lesion left first metatarsal that is painful     Assessment:  Mycotic nail infection along with lesion with patient who just got diabetic shoes and does have long-term diabetes with risk factors     Plan:  H&P condition reviewed and today I debrided nailbeds 1-5 both feet with no iatrogenic bleeding and lesion sub-first metatarsal left with no iatrogenic bleeding no breakdown of tissue and instructed to expect inspect this on a daily basis and reappoint for routine care 3 months or earlier if needed  X-rays indicate no signs of lysis or pathology associated with the enlargement of the lesion left

## 2019-03-22 ENCOUNTER — Other Ambulatory Visit: Payer: Self-pay | Admitting: Internal Medicine

## 2019-03-22 DIAGNOSIS — R5381 Other malaise: Secondary | ICD-10-CM

## 2019-03-26 ENCOUNTER — Other Ambulatory Visit: Payer: Self-pay | Admitting: Internal Medicine

## 2019-03-26 DIAGNOSIS — Z87891 Personal history of nicotine dependence: Secondary | ICD-10-CM

## 2019-05-27 DIAGNOSIS — T7840XA Allergy, unspecified, initial encounter: Secondary | ICD-10-CM | POA: Insufficient documentation

## 2019-06-05 ENCOUNTER — Ambulatory Visit: Payer: Medicare Other | Admitting: Podiatry

## 2019-07-16 ENCOUNTER — Ambulatory Visit (INDEPENDENT_AMBULATORY_CARE_PROVIDER_SITE_OTHER): Payer: Medicare Other | Admitting: Podiatry

## 2019-07-16 ENCOUNTER — Encounter: Payer: Self-pay | Admitting: Podiatry

## 2019-07-16 ENCOUNTER — Other Ambulatory Visit: Payer: Self-pay

## 2019-07-16 DIAGNOSIS — E1140 Type 2 diabetes mellitus with diabetic neuropathy, unspecified: Secondary | ICD-10-CM | POA: Diagnosis not present

## 2019-07-16 DIAGNOSIS — M79675 Pain in left toe(s): Secondary | ICD-10-CM | POA: Diagnosis not present

## 2019-07-16 DIAGNOSIS — B351 Tinea unguium: Secondary | ICD-10-CM | POA: Diagnosis not present

## 2019-07-16 DIAGNOSIS — E1151 Type 2 diabetes mellitus with diabetic peripheral angiopathy without gangrene: Secondary | ICD-10-CM

## 2019-07-16 DIAGNOSIS — E1149 Type 2 diabetes mellitus with other diabetic neurological complication: Secondary | ICD-10-CM

## 2019-07-16 DIAGNOSIS — M79674 Pain in right toe(s): Secondary | ICD-10-CM | POA: Diagnosis not present

## 2019-07-16 NOTE — Progress Notes (Signed)
This patient presents the office office with a chief complaint of a painful callus under the ball of his left foot.  Patient states that this callus is painful walking and wearing his shoes.  He has previously received diabetic shoes with off weightbearing area under the big toe joint left foot.  He says he has been wearing the shoes.  He presents the office today with his wife.  He also says he has real thick disfigured discolored nails on both feet.  Patient states that he is unable to self treat.  Patient was seen by Dr. Paulla Dolly in July 2020 and was referred to me  at that time.  Patient has taken 5 months for his return visit.  He presents the office today for an evaluation of the painful callus and treatment of his nails.  This patient has been diagnosed with DM, CVA, and ES RD. patient is taking insulin and Eliquis.   General Appearance  Alert, conversant and in no acute stress.  Vascular  Dorsalis pedis and posterior tibial  pulses are not  palpable  bilaterally.  Capillary return is within normal limits  Bilaterally.  Cold feet noted.  bilaterally.  Neurologic  Senn-Weinstein monofilament wire test diminished/absent   bilaterally. Muscle power within normal limits bilaterally.  Nails Thick disfigured discolored nails with subungual debris  from hallux to fifth toes bilaterally. No evidence of bacterial infection or drainage bilaterally.  Orthopedic  No limitations of motion  feet .  No crepitus or effusions noted.  No bony pathology or digital deformities noted.  Skin  normotropic skin with no porokeratosis noted bilaterally.  Pre-ulcerous callus noted sub 1 left foot with no redness or swelling or drainage.  Hyperkeratotic tissue noted at site of pre-ulcerous callus.  Onychomycosis  X 10.  Debride pre-ulcerous callus.  Added additional padding to left diabetic insole.  RTC 10 weeks.  A diabetic foot exam was performed revealing vascular and neurologic pathology.   Gardiner Barefoot DPM

## 2019-07-24 ENCOUNTER — Emergency Department (HOSPITAL_COMMUNITY)
Admission: EM | Admit: 2019-07-24 | Discharge: 2019-07-25 | Disposition: A | Discharge status: Home / Self Care | Payer: Medicare Other | Attending: Emergency Medicine | Admitting: Emergency Medicine

## 2019-07-24 ENCOUNTER — Encounter (HOSPITAL_COMMUNITY): Payer: Self-pay | Admitting: Emergency Medicine

## 2019-07-24 ENCOUNTER — Emergency Department (HOSPITAL_COMMUNITY): Payer: Medicare Other

## 2019-07-24 ENCOUNTER — Other Ambulatory Visit: Payer: Self-pay

## 2019-07-24 DIAGNOSIS — Z7982 Long term (current) use of aspirin: Secondary | ICD-10-CM | POA: Diagnosis not present

## 2019-07-24 DIAGNOSIS — I5022 Chronic systolic (congestive) heart failure: Secondary | ICD-10-CM | POA: Insufficient documentation

## 2019-07-24 DIAGNOSIS — I132 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 kidney disease, or end stage renal disease: Secondary | ICD-10-CM | POA: Diagnosis not present

## 2019-07-24 DIAGNOSIS — Z992 Dependence on renal dialysis: Secondary | ICD-10-CM | POA: Insufficient documentation

## 2019-07-24 DIAGNOSIS — N186 End stage renal disease: Secondary | ICD-10-CM | POA: Insufficient documentation

## 2019-07-24 DIAGNOSIS — R509 Fever, unspecified: Secondary | ICD-10-CM | POA: Diagnosis present

## 2019-07-24 DIAGNOSIS — Z8673 Personal history of transient ischemic attack (TIA), and cerebral infarction without residual deficits: Secondary | ICD-10-CM | POA: Insufficient documentation

## 2019-07-24 DIAGNOSIS — Z7901 Long term (current) use of anticoagulants: Secondary | ICD-10-CM | POA: Insufficient documentation

## 2019-07-24 DIAGNOSIS — U071 COVID-19: Secondary | ICD-10-CM | POA: Diagnosis not present

## 2019-07-24 DIAGNOSIS — Z79899 Other long term (current) drug therapy: Secondary | ICD-10-CM | POA: Insufficient documentation

## 2019-07-24 DIAGNOSIS — Z940 Kidney transplant status: Secondary | ICD-10-CM | POA: Diagnosis not present

## 2019-07-24 DIAGNOSIS — I252 Old myocardial infarction: Secondary | ICD-10-CM | POA: Insufficient documentation

## 2019-07-24 DIAGNOSIS — E1122 Type 2 diabetes mellitus with diabetic chronic kidney disease: Secondary | ICD-10-CM | POA: Diagnosis not present

## 2019-07-24 LAB — CBC WITH DIFFERENTIAL/PLATELET
Abs Immature Granulocytes: 0.06 10*3/uL (ref 0.00–0.07)
Basophils Absolute: 0 10*3/uL (ref 0.0–0.1)
Basophils Relative: 0 %
Eosinophils Absolute: 0 10*3/uL (ref 0.0–0.5)
Eosinophils Relative: 0 %
HCT: 28.5 % — ABNORMAL LOW (ref 39.0–52.0)
Hemoglobin: 9.4 g/dL — ABNORMAL LOW (ref 13.0–17.0)
Immature Granulocytes: 1 %
Lymphocytes Relative: 17 %
Lymphs Abs: 1.3 10*3/uL (ref 0.7–4.0)
MCH: 34.1 pg — ABNORMAL HIGH (ref 26.0–34.0)
MCHC: 33 g/dL (ref 30.0–36.0)
MCV: 103.3 fL — ABNORMAL HIGH (ref 80.0–100.0)
Monocytes Absolute: 0.5 10*3/uL (ref 0.1–1.0)
Monocytes Relative: 7 %
Neutro Abs: 5.8 10*3/uL (ref 1.7–7.7)
Neutrophils Relative %: 75 %
Platelets: 179 10*3/uL (ref 150–400)
RBC: 2.76 MIL/uL — ABNORMAL LOW (ref 4.22–5.81)
RDW: 14.5 % (ref 11.5–15.5)
WBC: 7.7 10*3/uL (ref 4.0–10.5)
nRBC: 0 % (ref 0.0–0.2)

## 2019-07-24 LAB — COMPREHENSIVE METABOLIC PANEL
ALT: 17 U/L (ref 0–44)
AST: 23 U/L (ref 15–41)
Albumin: 3.4 g/dL — ABNORMAL LOW (ref 3.5–5.0)
Alkaline Phosphatase: 39 U/L (ref 38–126)
Anion gap: 15 (ref 5–15)
BUN: 67 mg/dL — ABNORMAL HIGH (ref 8–23)
CO2: 27 mmol/L (ref 22–32)
Calcium: 7.8 mg/dL — ABNORMAL LOW (ref 8.9–10.3)
Chloride: 91 mmol/L — ABNORMAL LOW (ref 98–111)
Creatinine, Ser: 12.3 mg/dL — ABNORMAL HIGH (ref 0.61–1.24)
GFR calc Af Amer: 4 mL/min — ABNORMAL LOW (ref >60)
GFR calc non Af Amer: 4 mL/min — ABNORMAL LOW (ref >60)
Glucose, Bld: 126 mg/dL — ABNORMAL HIGH (ref 70–99)
Potassium: 5.9 mmol/L — ABNORMAL HIGH (ref 3.5–5.1)
Sodium: 133 mmol/L — ABNORMAL LOW (ref 135–145)
Total Bilirubin: 1.2 mg/dL (ref 0.3–1.2)
Total Protein: 6.7 g/dL (ref 6.5–8.1)

## 2019-07-24 LAB — LACTIC ACID, PLASMA: Lactic Acid, Venous: 1.1 mmol/L (ref 0.5–1.9)

## 2019-07-24 LAB — RESPIRATORY PANEL BY RT PCR (FLU A&B, COVID)
Influenza A by PCR: NEGATIVE
Influenza B by PCR: NEGATIVE
SARS Coronavirus 2 by RT PCR: POSITIVE — AB

## 2019-07-24 MED ORDER — SODIUM ZIRCONIUM CYCLOSILICATE 10 G PO PACK
10.0000 g | PACK | Freq: Once | ORAL | Status: AC
  Administered 2019-07-24: 10 g via ORAL
  Filled 2019-07-24: qty 1

## 2019-07-24 MED ORDER — SODIUM CHLORIDE 0.9% FLUSH: 3.0000 mL | Freq: Once | INTRAVENOUS | Status: DC

## 2019-07-24 MED ORDER — SODIUM POLYSTYRENE SULFONATE 15 GM/60ML PO SUSP
30.0000 g | Freq: Once | ORAL | Status: AC
  Administered 2019-07-24: 12:00:00 30 g via ORAL
  Filled 2019-07-24: qty 120

## 2019-07-24 MED ORDER — ACETAMINOPHEN 500 MG PO TABS
1000.0000 mg | ORAL_TABLET | Freq: Once | ORAL | Status: AC
  Administered 2019-07-24: 1000 mg via ORAL
  Filled 2019-07-24: qty 2

## 2019-07-24 NOTE — ED Notes (Signed)
Called dialysis coordinator [phone].

## 2019-07-24 NOTE — ED Provider Notes (Signed)
[facility] EMERGENCY DEPARTMENT Provider Note   CSN: [id number] Arrival date & time: 07/24/19  0547     History Chief Complaint  Patient presents with  . COVID/fever  . COVID exposure    Jonathan Antolini Sr. is a 68 y.o. male.  The history is provided by the patient and a caregiver.  URI Presenting symptoms: fever   Presenting symptoms: no cough, no ear pain and no sore throat   Severity:  Mild Onset quality:  Gradual Duration:  2 days Timing:  Intermittent Progression:  Waxing and waning Chronicity:  New Relieved by:  Nothing Worsened by:  Nothing Associated symptoms: arthralgias and headaches   Risk factors: chronic kidney disease and sick contacts (covid positive family members at home)        Past Medical History:  Diagnosis Date  . Diabetes mellitus without complication ([place])   . Hypertension   . Renal disorder 2015   right kidney transplant  . Stroke [facility])     Patient Active Problem List   Diagnosis Date Noted  . Chronic systolic HF (heart failure) ([place]) 02/25/2019  . ESRD (end stage renal disease) on dialysis ([place]) 02/25/2019  . Generalized abdominal pain 07/16/2018  . Arteriovenous fistula for hemodialysis in place, primary ([place]) 05/15/2018  . 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 Acute blood loss anemia 12/14/2017  . Hypomagnesemia 12/03/2017  . Anticoagulated 12/02/2017  . Stroke ([place]) 11/29/2017  . 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 History of MI (myocardial infarction) 11/28/2017  . Hypertension 11/28/2017  . Chronic hepatitis C without hepatic coma ([place]) 09/25/2017  . Diabetes mellitus type 2, uncomplicated ([place]) [id number]  . Myocardial infarction ([place]) 09/25/2017  . Renal transplant recipient 09/25/2017  . Transfusion history 09/25/2017  . History of coronary artery disease 09/12/2016  . 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 MI ([place]) 09/12/2016  . Aftercare following organ  transplant 09/11/2016  . 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 At risk for infection transmitted from donor 06/16/2014  . Ureteral stent displacement ([place]) 06/10/2014  . Prophylactic antibiotic 06/09/2014  . Immunosuppression ([place]) 06/06/2014    Past Surgical History:  Procedure Laterality Date  . BACK SURGERY     Has had 2 back surgeries  . Depression    . HAND SURGERY Right   . HAND SURGERY    . KIDNEY TRANSPLANT     November 2015  . Memory loss    . NEPHRECTOMY TRANSPLANTED ORGAN         No family history on file.  Social History   Tobacco Use  . Smoking status: Former Research scientist (life sciences)  . Smokeless tobacco: Never Used  Substance Use Topics  . Alcohol use: Never  . Drug use: Never    Home Medications Prior to Admission medications   Medication Sig Start Date End Date Taking? Authorizing Provider  allopurinol (ZYLOPRIM) 100 MG tablet Take 100 mg by mouth daily.  06/09/14  Yes [provider]  apixaban (ELIQUIS) 5 MG TABS tablet Take 5 mg by mouth 2 (two) times daily.  03/26/18  Yes [provider]  aspirin 81 MG chewable tablet Chew 81 mg by mouth daily.  06/09/14  Yes [provider]  atorvastatin (LIPITOR) 40 MG tablet Take 40 mg by mouth daily.   Yes [provider]  B Complex-C-Folic Acid (RENA-VITE RX) 1 MG TABS Take 1 tablet by mouth daily. 05/21/19  Yes [provider]  calcium acetate (PHOSLO) 667  MG capsule Take 1,334 mg by mouth 3 (three) times daily with meals.    Yes [provider]  lidocaine-prilocaine (EMLA) cream Apply 1 application topically 3 (three) times a week. 30 minutes prior to Dialysis 05/08/18  Yes [provider]  losartan (COZAAR) 25 MG tablet Take 25 mg by mouth daily.  03/29/19  Yes [provider]  metoCLOPramide (REGLAN) 5 MG tablet Take 5 mg by mouth 3 (three) times daily with meals.   Yes [provider]  metoprolol succinate (TOPROL-XL) 50  MG 24 hr tablet Take 50 mg by mouth daily.  06/09/14  Yes [provider]  NOVOLOG FLEXPEN 100 UNIT/ML FlexPen Inject 0-10 Units into the skin 2 (two) times daily before a meal. Per sliding scale 05/29/17  Yes [provider]  predniSONE (DELTASONE) 5 MG tablet TAKE 1 TABLET BY MOUTH EVERY DAY 03/17/17  Yes [provider]  senna (SENOKOT) 8.6 MG tablet Take 1 tablet by mouth as needed for constipation.   Yes [provider]  sevelamer carbonate (RENVELA) 2.4 g PACK Take 2.4 g by mouth 3 (three) times daily with meals.  05/22/19  Yes [provider]  TRESIBA FLEXTOUCH 200 UNIT/ML SOPN Inject 12 Units into the skin daily.  05/19/17  Yes [provider]  Insulin Pen Needle (BD PEN NEEDLE NANO U/F) 32G X 4 MM MISC U TO INJECT 5 TIMES A DAY 09/10/16   [provider]    Allergies    Patient has no known allergies.  Review of Systems   Review of Systems  Constitutional: Positive for fever. Negative for chills.  HENT: Negative for ear pain and sore throat.   Eyes: Negative for pain and visual disturbance.  Respiratory: Negative for cough and shortness of breath.   Cardiovascular: Negative for chest pain and palpitations.  Gastrointestinal: Negative for abdominal pain and vomiting.  Genitourinary: Negative for dysuria and hematuria.  Musculoskeletal: Positive for arthralgias. Negative for back pain.  Skin: Negative for color change and rash.  Neurological: Positive for headaches. Negative for seizures and syncope.  All other systems reviewed and are negative.   Physical Exam Updated Vital Signs  ED Triage Vitals  Enc Vitals Group     BP 07/24/19 0555 (!) 106/51     Pulse Rate 07/24/19 0555 90     Resp 07/24/19 0555 18     Temp 07/24/19 0555 100.1 F (37.8 C)     Temp Source 07/24/19 0555 Oral     SpO2 07/24/19 0555 100 %     Weight --      Height --      Head Circumference --      Peak Flow --      Pain Score 07/24/19 0604  10     Pain Loc --      Pain Edu? --      Excl. in [place]? --     Physical Exam Vitals and nursing note reviewed.  Constitutional:      Appearance: He is well-developed.  HENT:     Head: Normocephalic and atraumatic.     Mouth/Throat:     Mouth: Mucous membranes are moist.  Eyes:     Extraocular Movements: Extraocular movements intact.     Conjunctiva/sclera: Conjunctivae normal.     Pupils: Pupils are equal, round, and reactive to light.  Cardiovascular:     Rate and Rhythm: Normal rate and regular rhythm.     Pulses: Normal pulses.  Heart sounds: Normal heart sounds. No murmur.  Pulmonary:     Effort: Pulmonary effort is normal. No respiratory distress.     Breath sounds: Normal breath sounds.  Abdominal:     General: Abdomen is flat.     Palpations: Abdomen is soft.     Tenderness: There is no abdominal tenderness.  Musculoskeletal:        General: Normal range of motion.     Cervical back: Normal range of motion and neck supple.  Skin:    General: Skin is warm and dry.  Neurological:     General: No focal deficit present.     Mental Status: He is alert.     ED Results / Procedures / Treatments   Labs (all labs ordered are listed, but only abnormal results are displayed) Labs Reviewed  RESPIRATORY PANEL BY RT PCR (FLU A&B, COVID) - Abnormal; Notable for the following components:      Result Value   SARS Coronavirus 2 by RT PCR POSITIVE (*)    All other components within normal limits  COMPREHENSIVE METABOLIC PANEL - Abnormal; Notable for the following components:   Sodium 133 (*)    Potassium 5.9 (*)    Chloride 91 (*)    Glucose, Bld 126 (*)    BUN 67 (*)    Creatinine, Ser 12.30 (*)    Calcium 7.8 (*)    Albumin 3.4 (*)    GFR calc non Af Amer 4 (*)    GFR calc Af Amer 4 (*)    All other components within normal limits  CBC WITH DIFFERENTIAL/PLATELET - Abnormal; Notable for the following components:   RBC 2.76 (*)    Hemoglobin 9.4 (*)    HCT 28.5  (*)    MCV 103.3 (*)    MCH 34.1 (*)    All other components within normal limits  LACTIC ACID, PLASMA    EKG EKG Interpretation  Date/Time:  Wednesday July 24 2019 08:36:23 EST Ventricular Rate:  89 PR Interval:    QRS Duration: 95 QT Interval:  388 QTC Calculation: 473 R Axis:   58 Text Interpretation: Sinus rhythm Anterolateral infarct, age indeterminate Baseline wander in lead(s) V1 V2 Confirmed by Lennice Sites [phone]) on 07/24/2019 8:39:35 AM   Radiology DG Chest Portable 1 View  Result Date: 07/24/2019 CLINICAL DATA:  Shortness of breath and fever.  COVID exposure EXAM: PORTABLE CHEST 1 VIEW COMPARISON:  None. FINDINGS: Low volume chest with asymmetric elevation of the left diaphragm. No airspace disease, edema, effusion, or pneumothorax. Normal heart size for technique. Dialysis catheter with tip at the upper cavoatrial junction. IMPRESSION: 1. Low volume chest without acute finding. 2. Elevated left diaphragm. Electronically Signed   By: Monte Fantasia M.D.   On: 07/24/2019 06:28    Procedures Procedures (including critical care time)  Medications Ordered in ED Medications  sodium chloride flush (NS) 0.9 % injection 3 mL (has no administration in time range)  sodium polystyrene (KAYEXALATE) 15 GM/60ML suspension 30 g (has no administration in time range)  acetaminophen (TYLENOL) tablet 1,000 mg (1,000 mg Oral Given 07/24/19 0850)  sodium zirconium cyclosilicate (LOKELMA) packet 10 g (10 g Oral Given 07/24/19 0849)    ED Course  I have reviewed the triage vital signs and the nursing notes.  Pertinent labs & imaging results that were available during my care of the patient were reviewed by me and considered in my medical decision making (see chart for details).    MDM  Rules/Calculators/A&P                      Jonathan Rappaport Sr. is a 68 year old male with history of diabetes, end-stage renal disease on hemodialysis who presents to the ED with viral type  symptoms.  Patient with overall unremarkable vitals.  Temperature is 100.1.  Patient son with whom he lives with is positive for coronavirus.  Patient woke up this morning with body aches, headache and son sent patient for evaluation.  Patient was supposed to have dialysis today.  He did have dialysis on Monday.  Patient has a history of stroke with some mild cognitive issues with memory.  He is unable to tell me where he goes to dialysis.  However at this point he is asymptomatic.  No cough, no shortness of breath.  No longer with headache.  Patient with normal vitals.  No hypoxia.  No tachypnea.  Has had some body aches.  Lab work was collected prior to my evaluation that showed a potassium of 5.9.  Creatinine overall elevated but likely baseline.  No other significant anemia.  Chest x-ray overall with no signs of volume overload.  No signs of infection or pneumothorax.  Overall patient appears well.  Does have a low-grade temperature and suspect that he may have coronavirus given close exposure.  Will test for coronavirus and will touch base with nephrology about dialysis plans.  He was given a dose of Tylenol and Lokelma.  EKG showed sinus rhythm.  Did not show any hyperkalemic changes.  Anticipate discharge to home once patient has dialysis plan.  Patient positive for coronavirus.  Talked with Dr. Royce Macadamia with nephrology who recommends a dose of Kayexalate.  Patient has been arranged for dialysis tomorrow at [facility].  Overall he is asymptomatic from a coronavirus standpoint.  Lab work is overall reassuring.  EKG shows sinus rhythm.  Understands return precautions.  Discharged in good condition.  This chart was dictated using voice recognition software.  Despite best efforts to proofread,  errors can occur which can change the documentation meaning.     Final Clinical Impression(s) / ED Diagnoses Final diagnoses:  [id number]    Rx / DC Orders ED Discharge Orders    None       Lennice Sites,  DO 07/24/19 1135

## 2019-07-24 NOTE — Discharge Instructions (Addendum)
You have tested positive for coronavirus.  Please return to the ED if you develop worsening shortness of breath or other concerning symptoms.  You have been arranged to have dialysis now at [facility].  Your first session will be tomorrow at 12 noon.  Please call to confirm appointment.  Information is provided below.

## 2019-07-24 NOTE — Plan of Care (Signed)
ESRD on HD - now covid positive.  S/p lokelma and requested kayexalate 30 gram today as well.  Per ER Patient on room air and no acute distress.  Discussed with HD coordinator and ER - [facility] to [facility] tomorrow, 12/17 on TTS shift at noon.    Claudia Desanctis

## 2019-07-24 NOTE — ED Triage Notes (Signed)
Per EMS, pt from home where two family members are confirmed positive for COVID. Pt has a 10/10 headache and fever of 100.1.  Dialysis pt, M/W/S, did not go Monday.  Pt has hx of stroke that left him w/ cognitive deficits, family states confusion is baseline for pt.    125/57 HR 90 RR 18 96% RA CBG 124

## 2019-07-24 NOTE — Progress Notes (Signed)
Renal Navigator spoke with Clinic Manager at [facility] to discuss patient in ED being tested for COVID 19 and flu. Per  If patient returns negative for COVID, he can receive HD at [facility] on 3rd shift. If he is positive, his next treatment will be tomorrow, Thursday, 07/25/19 at [facility] isolation shift at 12:00pm. Given patient's exposure and symptoms, test result is needed before making plans for next treatment.  Renal Navigator will follow test result and make plans accordingly  Alphonzo Cruise, [place] Renal Navigator [phone]

## 2019-07-24 NOTE — Progress Notes (Signed)
Patient has tested positive for COVID 19. Renal Navigator notified Nephrologist/Dr. Royce Macadamia. Renal Navigator informed patient's wife and gave her specific instructions on where patient will receive OP HD treatment until he is cleared to return to his home clinic/[facility]:  [facility] [address], [place] [phone] Tuesday, Thursday, Saturday 12:00pm Please arrive to the parking lot at 11:45am and call clinic to notify that you have arrived and wait to be allowed in.  Alphonzo Cruise, [place] Renal Navigator [phone]

## 2019-07-30 ENCOUNTER — Inpatient Hospital Stay (HOSPITAL_COMMUNITY)
Admission: EM | Admit: 2019-07-30 | Discharge: 2019-09-22 | DRG: 177 | Disposition: A | Discharge status: Skilled Nursing Facility | Payer: Medicare Other | Attending: Internal Medicine | Admitting: Internal Medicine

## 2019-07-30 ENCOUNTER — Emergency Department (HOSPITAL_COMMUNITY): Payer: Medicare Other

## 2019-07-30 ENCOUNTER — Other Ambulatory Visit: Payer: Self-pay

## 2019-07-30 ENCOUNTER — Encounter (HOSPITAL_COMMUNITY): Payer: Self-pay | Admitting: Internal Medicine

## 2019-07-30 DIAGNOSIS — E11649 Type 2 diabetes mellitus with hypoglycemia without coma: Secondary | ICD-10-CM | POA: Diagnosis not present

## 2019-07-30 DIAGNOSIS — S301XXA Contusion of abdominal wall, initial encounter: Secondary | ICD-10-CM | POA: Diagnosis present

## 2019-07-30 DIAGNOSIS — E1165 Type 2 diabetes mellitus with hyperglycemia: Secondary | ICD-10-CM | POA: Diagnosis present

## 2019-07-30 DIAGNOSIS — B182 Chronic viral hepatitis C: Secondary | ICD-10-CM | POA: Diagnosis present

## 2019-07-30 DIAGNOSIS — I2510 Atherosclerotic heart disease of native coronary artery without angina pectoris: Secondary | ICD-10-CM | POA: Diagnosis present

## 2019-07-30 DIAGNOSIS — R7303 Prediabetes: Secondary | ICD-10-CM | POA: Diagnosis not present

## 2019-07-30 DIAGNOSIS — R509 Fever, unspecified: Secondary | ICD-10-CM | POA: Diagnosis not present

## 2019-07-30 DIAGNOSIS — Z87891 Personal history of nicotine dependence: Secondary | ICD-10-CM | POA: Diagnosis not present

## 2019-07-30 DIAGNOSIS — I340 Nonrheumatic mitral (valve) insufficiency: Secondary | ICD-10-CM | POA: Diagnosis not present

## 2019-07-30 DIAGNOSIS — N2581 Secondary hyperparathyroidism of renal origin: Secondary | ICD-10-CM | POA: Diagnosis present

## 2019-07-30 DIAGNOSIS — I252 Old myocardial infarction: Secondary | ICD-10-CM

## 2019-07-30 DIAGNOSIS — Z8673 Personal history of transient ischemic attack (TIA), and cerebral infarction without residual deficits: Secondary | ICD-10-CM

## 2019-07-30 DIAGNOSIS — J189 Pneumonia, unspecified organism: Secondary | ICD-10-CM | POA: Diagnosis not present

## 2019-07-30 DIAGNOSIS — E8889 Other specified metabolic disorders: Secondary | ICD-10-CM | POA: Diagnosis present

## 2019-07-30 DIAGNOSIS — G588 Other specified mononeuropathies: Secondary | ICD-10-CM | POA: Diagnosis present

## 2019-07-30 DIAGNOSIS — E119 Type 2 diabetes mellitus without complications: Secondary | ICD-10-CM | POA: Diagnosis not present

## 2019-07-30 DIAGNOSIS — I120 Hypertensive chronic kidney disease with stage 5 chronic kidney disease or end stage renal disease: Secondary | ICD-10-CM | POA: Diagnosis present

## 2019-07-30 DIAGNOSIS — Y830 Surgical operation with transplant of whole organ as the cause of abnormal reaction of the patient, or of later complication, without mention of misadventure at the time of the procedure: Secondary | ICD-10-CM | POA: Diagnosis present

## 2019-07-30 DIAGNOSIS — G9340 Encephalopathy, unspecified: Secondary | ICD-10-CM

## 2019-07-30 DIAGNOSIS — J9601 Acute respiratory failure with hypoxia: Secondary | ICD-10-CM | POA: Diagnosis present

## 2019-07-30 DIAGNOSIS — I6340 Cerebral infarction due to embolism of unspecified cerebral artery: Secondary | ICD-10-CM | POA: Insufficient documentation

## 2019-07-30 DIAGNOSIS — R1310 Dysphagia, unspecified: Secondary | ICD-10-CM | POA: Diagnosis present

## 2019-07-30 DIAGNOSIS — I6330 Cerebral infarction due to thrombosis of unspecified cerebral artery: Secondary | ICD-10-CM | POA: Insufficient documentation

## 2019-07-30 DIAGNOSIS — Z86718 Personal history of other venous thrombosis and embolism: Secondary | ICD-10-CM | POA: Diagnosis not present

## 2019-07-30 DIAGNOSIS — R7881 Bacteremia: Secondary | ICD-10-CM | POA: Diagnosis not present

## 2019-07-30 DIAGNOSIS — I253 Aneurysm of heart: Secondary | ICD-10-CM | POA: Diagnosis present

## 2019-07-30 DIAGNOSIS — Z992 Dependence on renal dialysis: Secondary | ICD-10-CM | POA: Diagnosis not present

## 2019-07-30 DIAGNOSIS — Z79899 Other long term (current) drug therapy: Secondary | ICD-10-CM

## 2019-07-30 DIAGNOSIS — D631 Anemia in chronic kidney disease: Secondary | ICD-10-CM | POA: Diagnosis present

## 2019-07-30 DIAGNOSIS — Z8679 Personal history of other diseases of the circulatory system: Secondary | ICD-10-CM

## 2019-07-30 DIAGNOSIS — M5136 Other intervertebral disc degeneration, lumbar region: Secondary | ICD-10-CM | POA: Diagnosis present

## 2019-07-30 DIAGNOSIS — R413 Other amnesia: Secondary | ICD-10-CM | POA: Diagnosis present

## 2019-07-30 DIAGNOSIS — Z751 Person awaiting admission to adequate facility elsewhere: Secondary | ICD-10-CM

## 2019-07-30 DIAGNOSIS — E1122 Type 2 diabetes mellitus with diabetic chronic kidney disease: Secondary | ICD-10-CM | POA: Diagnosis present

## 2019-07-30 DIAGNOSIS — J9600 Acute respiratory failure, unspecified whether with hypoxia or hypercapnia: Secondary | ICD-10-CM | POA: Diagnosis not present

## 2019-07-30 DIAGNOSIS — B351 Tinea unguium: Secondary | ICD-10-CM | POA: Diagnosis not present

## 2019-07-30 DIAGNOSIS — Z7901 Long term (current) use of anticoagulants: Secondary | ICD-10-CM

## 2019-07-30 DIAGNOSIS — M48061 Spinal stenosis, lumbar region without neurogenic claudication: Secondary | ICD-10-CM | POA: Diagnosis present

## 2019-07-30 DIAGNOSIS — I513 Intracardiac thrombosis, not elsewhere classified: Secondary | ICD-10-CM | POA: Diagnosis not present

## 2019-07-30 DIAGNOSIS — I159 Secondary hypertension, unspecified: Secondary | ICD-10-CM | POA: Diagnosis not present

## 2019-07-30 DIAGNOSIS — Z833 Family history of diabetes mellitus: Secondary | ICD-10-CM

## 2019-07-30 DIAGNOSIS — I7389 Other specified peripheral vascular diseases: Secondary | ICD-10-CM | POA: Diagnosis present

## 2019-07-30 DIAGNOSIS — R0602 Shortness of breath: Secondary | ICD-10-CM

## 2019-07-30 DIAGNOSIS — I10 Essential (primary) hypertension: Secondary | ICD-10-CM | POA: Diagnosis not present

## 2019-07-30 DIAGNOSIS — T8612 Kidney transplant failure: Secondary | ICD-10-CM | POA: Diagnosis present

## 2019-07-30 DIAGNOSIS — I639 Cerebral infarction, unspecified: Secondary | ICD-10-CM | POA: Diagnosis not present

## 2019-07-30 DIAGNOSIS — I472 Ventricular tachycardia: Secondary | ICD-10-CM | POA: Diagnosis not present

## 2019-07-30 DIAGNOSIS — U071 COVID-19: Secondary | ICD-10-CM | POA: Diagnosis present

## 2019-07-30 DIAGNOSIS — Z940 Kidney transplant status: Secondary | ICD-10-CM | POA: Diagnosis not present

## 2019-07-30 DIAGNOSIS — N390 Urinary tract infection, site not specified: Secondary | ICD-10-CM | POA: Diagnosis not present

## 2019-07-30 DIAGNOSIS — R69 Illness, unspecified: Secondary | ICD-10-CM

## 2019-07-30 DIAGNOSIS — N186 End stage renal disease: Secondary | ICD-10-CM | POA: Diagnosis not present

## 2019-07-30 DIAGNOSIS — Z7952 Long term (current) use of systemic steroids: Secondary | ICD-10-CM

## 2019-07-30 DIAGNOSIS — Z7982 Long term (current) use of aspirin: Secondary | ICD-10-CM

## 2019-07-30 DIAGNOSIS — J1282 Pneumonia due to coronavirus disease 2019: Secondary | ICD-10-CM | POA: Diagnosis present

## 2019-07-30 DIAGNOSIS — Z794 Long term (current) use of insulin: Secondary | ICD-10-CM

## 2019-07-30 DIAGNOSIS — R739 Hyperglycemia, unspecified: Secondary | ICD-10-CM | POA: Diagnosis not present

## 2019-07-30 DIAGNOSIS — R471 Dysarthria and anarthria: Secondary | ICD-10-CM | POA: Diagnosis present

## 2019-07-30 DIAGNOSIS — Z8616 Personal history of COVID-19: Secondary | ICD-10-CM | POA: Diagnosis not present

## 2019-07-30 DIAGNOSIS — R29810 Facial weakness: Secondary | ICD-10-CM | POA: Diagnosis present

## 2019-07-30 LAB — COMPREHENSIVE METABOLIC PANEL
ALT: 20 U/L (ref 0–44)
AST: 26 U/L (ref 15–41)
Albumin: 2.7 g/dL — ABNORMAL LOW (ref 3.5–5.0)
Alkaline Phosphatase: 43 U/L (ref 38–126)
Anion gap: 16 — ABNORMAL HIGH (ref 5–15)
BUN: 29 mg/dL — ABNORMAL HIGH (ref 8–23)
CO2: 28 mmol/L (ref 22–32)
Calcium: 7.4 mg/dL — ABNORMAL LOW (ref 8.9–10.3)
Chloride: 93 mmol/L — ABNORMAL LOW (ref 98–111)
Creatinine, Ser: 7.21 mg/dL — ABNORMAL HIGH (ref 0.61–1.24)
GFR calc Af Amer: 8 mL/min — ABNORMAL LOW (ref >60)
GFR calc non Af Amer: 7 mL/min — ABNORMAL LOW (ref >60)
Glucose, Bld: 144 mg/dL — ABNORMAL HIGH (ref 70–99)
Potassium: 3.7 mmol/L (ref 3.5–5.1)
Sodium: 137 mmol/L (ref 135–145)
Total Bilirubin: 0.8 mg/dL (ref 0.3–1.2)
Total Protein: 6.8 g/dL (ref 6.5–8.1)

## 2019-07-30 LAB — LACTIC ACID, PLASMA: Lactic Acid, Venous: 2.9 mmol/L (ref 0.5–1.9)

## 2019-07-30 LAB — CBC WITH DIFFERENTIAL/PLATELET
Abs Immature Granulocytes: 0.1 10*3/uL — ABNORMAL HIGH (ref 0.00–0.07)
Basophils Absolute: 0 10*3/uL (ref 0.0–0.1)
Basophils Relative: 0 %
Eosinophils Absolute: 0 10*3/uL (ref 0.0–0.5)
Eosinophils Relative: 0 %
HCT: 29.3 % — ABNORMAL LOW (ref 39.0–52.0)
Hemoglobin: 9.6 g/dL — ABNORMAL LOW (ref 13.0–17.0)
Immature Granulocytes: 2 %
Lymphocytes Relative: 13 %
Lymphs Abs: 0.9 10*3/uL (ref 0.7–4.0)
MCH: 33.8 pg (ref 26.0–34.0)
MCHC: 32.8 g/dL (ref 30.0–36.0)
MCV: 103.2 fL — ABNORMAL HIGH (ref 80.0–100.0)
Monocytes Absolute: 0.2 10*3/uL (ref 0.1–1.0)
Monocytes Relative: 3 %
Neutro Abs: 5.6 10*3/uL (ref 1.7–7.7)
Neutrophils Relative %: 82 %
Platelets: 251 10*3/uL (ref 150–400)
RBC: 2.84 MIL/uL — ABNORMAL LOW (ref 4.22–5.81)
RDW: 14.1 % (ref 11.5–15.5)
WBC: 6.8 10*3/uL (ref 4.0–10.5)
nRBC: 0.3 % — ABNORMAL HIGH (ref 0.0–0.2)

## 2019-07-30 LAB — TRIGLYCERIDES: Triglycerides: 124 mg/dL (ref <150)

## 2019-07-30 LAB — CBG MONITORING, ED: Glucose-Capillary: 96 mg/dL (ref 70–99)

## 2019-07-30 LAB — FIBRINOGEN: Fibrinogen: 706 mg/dL — ABNORMAL HIGH (ref 210–475)

## 2019-07-30 LAB — D-DIMER, QUANTITATIVE: D-Dimer, Quant: 1.12 ug/mL-FEU — ABNORMAL HIGH (ref 0.00–0.50)

## 2019-07-30 LAB — TROPONIN I (HIGH SENSITIVITY): Troponin I (High Sensitivity): 167 ng/L (ref <18)

## 2019-07-30 LAB — PROCALCITONIN: Procalcitonin: 2.24 ng/mL

## 2019-07-30 LAB — BRAIN NATRIURETIC PEPTIDE: B Natriuretic Peptide: 797.5 pg/mL — ABNORMAL HIGH (ref 0.0–100.0)

## 2019-07-30 LAB — C-REACTIVE PROTEIN: CRP: 12.8 mg/dL — ABNORMAL HIGH (ref <1.0)

## 2019-07-30 LAB — LACTATE DEHYDROGENASE: LDH: 457 U/L — ABNORMAL HIGH (ref 98–192)

## 2019-07-30 MED ORDER — ONDANSETRON HCL 4 MG/2ML IJ SOLN: 4.0000 mg | Freq: Four times a day (QID) | INTRAMUSCULAR | Status: DC | PRN

## 2019-07-30 MED ORDER — SODIUM CHLORIDE 0.9 % IV SOLN
200.0000 mg | Freq: Once | INTRAVENOUS | Status: AC
  Administered 2019-07-30: 200 mg via INTRAVENOUS
  Filled 2019-07-30: qty 200

## 2019-07-30 MED ORDER — DEXAMETHASONE SODIUM PHOSPHATE 10 MG/ML IJ SOLN
6.0000 mg | INTRAMUSCULAR | Status: AC
  Administered 2019-07-30 – 2019-08-09 (×10): 6 mg via INTRAVENOUS
  Filled 2019-07-30 (×10): qty 1

## 2019-07-30 MED ORDER — ACETAMINOPHEN 325 MG PO TABS
650.0000 mg | ORAL_TABLET | Freq: Four times a day (QID) | ORAL | Status: DC | PRN
  Administered 2019-09-02 – 2019-09-19 (×9): 650 mg via ORAL
  Filled 2019-07-30 (×9): qty 2

## 2019-07-30 MED ORDER — INSULIN ASPART 100 UNIT/ML ~~LOC~~ SOLN
0.0000 [IU] | SUBCUTANEOUS | Status: DC
  Administered 2019-07-31 – 2019-08-01 (×4): 1 [IU] via SUBCUTANEOUS
  Administered 2019-08-02: 2 [IU] via SUBCUTANEOUS
  Administered 2019-08-02 (×2): 1 [IU] via SUBCUTANEOUS
  Administered 2019-08-02: 2 [IU] via SUBCUTANEOUS
  Administered 2019-08-02: 3 [IU] via SUBCUTANEOUS
  Administered 2019-08-03: 1 [IU] via SUBCUTANEOUS
  Administered 2019-08-03: 3 [IU] via SUBCUTANEOUS
  Administered 2019-08-03: 4 [IU] via SUBCUTANEOUS
  Administered 2019-08-03: 6 [IU] via SUBCUTANEOUS
  Administered 2019-08-03: 3 [IU] via SUBCUTANEOUS
  Administered 2019-08-03: 4 [IU] via SUBCUTANEOUS
  Administered 2019-08-04 (×2): 1 [IU] via SUBCUTANEOUS
  Administered 2019-08-04 (×2): 3 [IU] via SUBCUTANEOUS
  Administered 2019-08-05 (×4): 1 [IU] via SUBCUTANEOUS
  Administered 2019-08-05: 2 [IU] via SUBCUTANEOUS
  Administered 2019-08-05: 1 [IU] via SUBCUTANEOUS
  Administered 2019-08-06: 2 [IU] via SUBCUTANEOUS
  Administered 2019-08-06 – 2019-08-08 (×6): 1 [IU] via SUBCUTANEOUS
  Administered 2019-08-08 – 2019-08-09 (×2): 2 [IU] via SUBCUTANEOUS
  Administered 2019-08-09 (×2): 1 [IU] via SUBCUTANEOUS
  Administered 2019-08-09 – 2019-08-10 (×2): 2 [IU] via SUBCUTANEOUS
  Administered 2019-08-10 (×3): 1 [IU] via SUBCUTANEOUS
  Administered 2019-08-10: 3 [IU] via SUBCUTANEOUS
  Administered 2019-08-11 – 2019-08-12 (×2): 1 [IU] via SUBCUTANEOUS

## 2019-07-30 MED ORDER — SODIUM CHLORIDE 0.9 % IV SOLN
100.0000 mg | Freq: Every day | INTRAVENOUS | Status: AC
  Administered 2019-07-31 – 2019-08-03 (×4): 100 mg via INTRAVENOUS
  Filled 2019-07-30 (×4): qty 20

## 2019-07-30 MED ORDER — ACETAMINOPHEN 650 MG RE SUPP: 650.0000 mg | Freq: Four times a day (QID) | RECTAL | Status: DC | PRN

## 2019-07-30 MED ORDER — METOPROLOL TARTRATE 5 MG/5ML IV SOLN
2.5000 mg | Freq: Four times a day (QID) | INTRAVENOUS | Status: DC
  Administered 2019-07-30 – 2019-07-31 (×3): 2.5 mg via INTRAVENOUS
  Filled 2019-07-30 (×3): qty 5

## 2019-07-30 MED ORDER — ONDANSETRON HCL 4 MG PO TABS: 4.0000 mg | ORAL_TABLET | Freq: Four times a day (QID) | ORAL | Status: DC | PRN

## 2019-07-30 NOTE — ED Notes (Signed)
Mrs. Kristion Deforge updated about POC up to this moment on the pt.
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
## 2019-07-30 NOTE — ED Triage Notes (Signed)
Patient brought in by wife, coming from his dialysis treatment. Wife states patient has been having difficulty swallowing x 2-3 weeks and is requesting a swallow evaluation. Notices patient pocketing food recently. Wife adds patient has been less vocal and lethargic.

## 2019-07-30 NOTE — H&P (Addendum)
History and Physical    Jonathan Ruppel Sr. [id number] DOB: 1950/11/15 DOA: 07/30/2019  PCP: Marton Redwood, MD  Patient coming from: Home.  Chief Complaint: Difficulty swallowing.  History obtained from patient's wife.  Patient appears confused.  HPI: Jonathan Antelo Sr. is a 68 y.o. male with history of ESRD on hemodialysis on Tuesday Thursday Saturday, LV thrombus on apixaban, renal transplant history of stroke, hypertension diabetes mellitus was having increasing difficulty swallowing over the last 2 weeks as noticed by patient's wife.  Patient's wife states that patient has been pocketing his food and has found difficulty swallowing.  About 6 days ago he was diagnosed with Covid at the time he was not symptomatic.  For the last 4 days he has become more short of breath and lethargic and confused.  Patient did have his dialysis today following which patient was brought to the ER.  ED Course: In the ER patient is afebrile with chest x-ray showing multifocal pneumonia.  Labs show high sensitive troponin of 167 ferritin 3900 CRP 12.8 procalcitonin 2.2 lactic acid 2.9.  Hemoglobin 9.6 platelets 251.  Patient admitted for acute encephalopathy with difficulty swallowing and Covid pneumonia.  Review of Systems: As per HPI, rest all negative.   Past Medical History:  Diagnosis Date  . Diabetes mellitus without complication ([place])   . Hypertension   . Renal disorder 2015   right kidney transplant  . Stroke [facility])     Past Surgical History:  Procedure Laterality Date  . BACK SURGERY     Has had 2 back surgeries  . Depression    . HAND SURGERY Right   . HAND SURGERY    . KIDNEY TRANSPLANT     November 2015  . Memory loss    . NEPHRECTOMY TRANSPLANTED ORGAN       reports that he has quit smoking. He has never used smokeless tobacco. He reports that he does not drink alcohol or use drugs.  No Known Allergies  Family History  Problem Relation Age of Onset  . Diabetes Mellitus II  Mother   . Diabetes Mellitus II Brother     Prior to Admission medications   Medication Sig Start Date End Date Taking? Authorizing Provider  allopurinol (ZYLOPRIM) 100 MG tablet Take 100 mg by mouth daily.  06/09/14   [provider]  apixaban (ELIQUIS) 5 MG TABS tablet Take 5 mg by mouth 2 (two) times daily.  03/26/18   [provider]  aspirin 81 MG chewable tablet Chew 81 mg by mouth daily.  06/09/14   [provider]  atorvastatin (LIPITOR) 40 MG tablet Take 40 mg by mouth daily.    [provider]  B Complex-C-Folic Acid (RENA-VITE RX) 1 MG TABS Take 1 tablet by mouth daily. 05/21/19   [provider]  calcium acetate (PHOSLO) 667 MG capsule Take 1,334 mg by mouth 3 (three) times daily with meals.     [provider]  Insulin Pen Needle (BD PEN NEEDLE NANO U/F) 32G X 4 MM MISC U TO INJECT 5 TIMES A DAY 09/10/16   [provider]  lidocaine-prilocaine (EMLA) cream Apply 1 application topically 3 (three) times a week. 30 minutes prior to Dialysis 05/08/18   [provider]  losartan (COZAAR) 25 MG tablet Take 25 mg by mouth daily.  03/29/19   [provider]  metoCLOPramide (REGLAN) 5 MG tablet Take 5 mg by mouth 3 (three) times daily with meals.    [provider]  metoprolol succinate (TOPROL-XL) 50 MG 24 hr tablet Take 50 mg by mouth daily.  06/09/14   [provider]  NOVOLOG FLEXPEN 100 UNIT/ML FlexPen Inject 0-10 Units into the skin 2 (two) times daily before a meal. Per sliding scale 05/29/17   [provider]  predniSONE (DELTASONE) 5 MG tablet TAKE 1 TABLET BY MOUTH EVERY DAY 03/17/17   [provider]  senna (SENOKOT) 8.6 MG tablet Take 1 tablet by mouth as needed for constipation.    [provider]  sevelamer carbonate (RENVELA) 2.4 g PACK Take 2.4 g by mouth 3 (three) times daily with meals.  05/22/19   [provider]  TRESIBA FLEXTOUCH 200 UNIT/ML SOPN  Inject 12 Units into the skin daily.  05/19/17   [provider]    Physical Exam: Constitutional: Moderately built and nourished. Vitals:   07/30/19 1815 07/30/19 1845 07/30/19 1900 07/30/19 2000  BP:      Pulse: 84 84 84 (!) 102  Resp: (!) 22 (!) 22 (!) 28 (!) 23  Temp:      TempSrc:      SpO2: 98% 99% 96% (!) 80%   Eyes: Anicteric no pallor. ENMT: No discharge from the ears eyes nose or mouth. Neck: No mass or.  No neck rigidity. Respiratory: No rhonchi or crepitations. Cardiovascular: S1-S2 heard. Abdomen: Soft nontender bowel sounds present. Musculoskeletal: No edema.   Skin: No rash. Neurologic: Alert awake but confused moves all extremities. Psychiatric: Appears confused.   Labs on Admission: I have personally reviewed following labs and imaging studies  CBC: Recent Labs  Lab 07/24/19 0634 07/30/19 1843  WBC 7.7 6.8  NEUTROABS 5.8 5.6  HGB 9.4* 9.6*  HCT 28.5* 29.3*  MCV 103.3* 103.2*  PLT 179 123XX123   Basic Metabolic Panel: Recent Labs  Lab 07/24/19 0634 07/30/19 1843  NA 133* 137  K 5.9* 3.7  CL 91* 93*  CO2 27 28  GLUCOSE 126* 144*  BUN 67* 29*  CREATININE 12.30* 7.21*  CALCIUM 7.8* 7.4*   GFR: CrCl cannot be calculated (Unknown ideal weight.). Liver Function Tests: Recent Labs  Lab 07/24/19 0634 07/30/19 1843  AST 23 26  ALT 17 20  ALKPHOS 39 43  BILITOT 1.2 0.8  PROT 6.7 6.8  ALBUMIN 3.4* 2.7*   No results for input(s): LIPASE, AMYLASE in the last 168 hours. No results for input(s): AMMONIA in the last 168 hours. Coagulation Profile: No results for input(s): INR, PROTIME in the last 168 hours. Cardiac Enzymes: No results for input(s): CKTOTAL, CKMB, CKMBINDEX, TROPONINI in the last 168 hours. BNP (last 3 results) No results for input(s): PROBNP in the last 8760 hours. HbA1C: No results for input(s): HGBA1C in the last 72 hours. CBG: No results for input(s): GLUCAP in the last 168 hours. Lipid Profile: Recent Labs     07/30/19 1843  TRIG 124   Thyroid Function Tests: No results for input(s): TSH, T4TOTAL, FREET4, T3FREE, THYROIDAB in the last 72 hours. Anemia Panel: No results for input(s): VITAMINB12, FOLATE, FERRITIN, TIBC, IRON, RETICCTPCT in the last 72 hours. Urine analysis: No results found for: COLORURINE, APPEARANCEUR, LABSPEC, PHURINE, GLUCOSEU, HGBUR, BILIRUBINUR, KETONESUR, PROTEINUR, UROBILINOGEN, NITRITE, LEUKOCYTESUR Sepsis Labs: @LABRCNTIP (procalcitonin:4,lacticidven:4) ) Recent Results (from the past 240 hour(s))  Respiratory Panel by RT PCR (Flu A&B, Covid) - Nasopharyngeal Swab     Status: Abnormal   Collection Time: 07/24/19  8:52 AM   Specimen: Nasopharyngeal Swab  Result Value Ref Range Status   SARS Coronavirus 2  by RT PCR POSITIVE (A) NEGATIVE Final    Comment: RESULT CALLED TO, READ BACK BY AND VERIFIED WITH: Darrold Span RN 10:45 07/24/19 ([email or web address]) (NOTE) SARS-CoV-2 target nucleic acids are DETECTED. SARS-CoV-2 RNA is generally detectable in upper respiratory specimens  during the acute phase of infection. Positive results are indicative of the presence of the identified virus, but do not rule out bacterial infection or co-infection with other pathogens not detected by the test. Clinical correlation with patient history and other diagnostic information is necessary to determine patient infection status. The expected result is Negative. Fact Sheet for Patients:  PinkCheek.be Fact Sheet for Healthcare Providers: GravelBags.it This test is not yet approved or cleared by the [place] FDA and  has been authorized for detection and/or diagnosis of SARS-CoV-2 by FDA under an Emergency Use Authorization (EUA).  This EUA will remain in effect (meaning this test can be [place] ed) for the duration of  the COVID-19 declaration under Section 564(b)(1) of the Act, 21 U.S.C. section 360bbb-3(b)(1), unless the authorization  is terminated or revoked sooner.    Influenza A by PCR NEGATIVE NEGATIVE Final   Influenza B by PCR NEGATIVE NEGATIVE Final    Comment: (NOTE) The Xpert Xpress SARS-CoV-2/FLU/RSV assay is intended as an aid in  the diagnosis of influenza from Nasopharyngeal swab specimens and  should not be used as a sole basis for treatment. Nasal washings and  aspirates are unacceptable for Xpert Xpress SARS-CoV-2/FLU/RSV  testing. Fact Sheet for Patients: PinkCheek.be Fact Sheet for Healthcare Providers: GravelBags.it This test is not yet approved or cleared by the [place] FDA and  has been authorized for detection and/or diagnosis of SARS-CoV-2 by  FDA under an Emergency Use Authorization (EUA). This EUA will remain  in effect (meaning this test can be used) for the duration of the  Covid-19 declaration under Section 564(b)(1) of the Act, 21  U.S.C. section 360bbb-3(b)(1), unless the authorization is  terminated or revoked. Performed at [facility], [place] [address] [place], [place] [address]      Radiological Exams on Admission: DG Chest Port 1 View  Result Date: 07/30/2019 CLINICAL DATA:  COVID-19 EXAM: PORTABLE CHEST 1 VIEW COMPARISON:  Radiograph 07/24/2019 FINDINGS: Asymmetric elevation left hemidiaphragm is similar to prior. There are increasing patchy interstitial and airspace opacities throughout both lungs most notably in the left base and periphery of the right lung. A right IJ approach dual lumen dialysis catheter tip terminates at the superior cavoatrial junction. Cardiomediastinal contours are similar to prior counting for differences in technique and patient rotation. No acute osseous or soft tissue abnormality. Degenerative changes are present in the imaged spine and shoulders. IMPRESSION: 1. Increasing patchy interstitial and airspace opacities throughout both lungs Padda compatible with multifocal pneumonia. 2.  Stable asymmetric elevation of the left hemidiaphragm. Electronically Signed   By: Lovena Le M.D.   On: 07/30/2019 19:12    EKG: Independently reviewed.  Normal sinus rhythm.  Assessment/Plan Principal Problem:   Acute respiratory failure due to COVID-19 [facility]) Active Problems:   Chronic hepatitis C without hepatic coma ([name])   Diabetes mellitus type 2, uncomplicated ([name])   History of coronary artery disease   Hypertension   ESRD (end stage renal disease) on dialysis ([place])   Acute respiratory failure with hypoxia ([place])    1. Acute respiratory failure secondary to Covid pneumonia for which I have placed patient on IV Decadron and remdesivir.  Follow influenza markers and respiratory status.  Since patient's procalcitonin  is elevated will get blood cultures and keep empiric antibiotics for now. 2. Acute encephalopathy with difficulty swallowing we will need to rule out stroke for which I have ordered CT head.  Get speech therapy evaluation and keep patient n.p.o. 3. Hypertension we will keep patient on IV scheduled metoprolol for now. 4. Diabetes mellitus type 2 we will keep patient on sliding scale coverage for now. 5. ESRD on hemodialysis on Tuesday Thursday Saturday has had dialysis today.  Consult nephrology. 6. History of CAD high-sensitivity troponin has been elevated we will cycle cardiac markers. 7. History of renal transplant on prednisone presently on IV Decadron. 8. Anemia likely from renal disease follow CBC. 9. History of LV thrombus on apixaban.  Since patient is well to be n.p.o. I have discussed with patient's wife and place patient on heparin.  Given the Covid infection with pneumonia and possible strokelike symptoms will need inpatient status.  Addendum -CT head was showing possibility of stroke which I discussed with Dr. Kerney Elbe, neurologist who advised to get MRI brain and if MRI brain does show stroke will need further work-up.   DVT prophylaxis:  Heparin. Code Status: Full code. Family Communication: Patient's wife. Disposition Plan: Home. Consults called: Discussed with neurology. Admission status: Inpatient.   Rise Patience MD [facility] Pager [phone].  If 7PM-7AM, please contact night-coverage [email or web address] Password Operating Room Services  07/30/2019, 10:13 PM

## 2019-07-30 NOTE — ED Notes (Signed)
Please call wife for updates at [phone]

## 2019-07-31 ENCOUNTER — Inpatient Hospital Stay (HOSPITAL_COMMUNITY): Payer: Medicare Other

## 2019-07-31 DIAGNOSIS — B182 Chronic viral hepatitis C: Secondary | ICD-10-CM

## 2019-07-31 DIAGNOSIS — J9601 Acute respiratory failure with hypoxia: Secondary | ICD-10-CM

## 2019-07-31 DIAGNOSIS — I159 Secondary hypertension, unspecified: Secondary | ICD-10-CM

## 2019-07-31 DIAGNOSIS — Z8679 Personal history of other diseases of the circulatory system: Secondary | ICD-10-CM

## 2019-07-31 LAB — LIPID PANEL
Cholesterol: 91 mg/dL (ref 0–200)
HDL: 31 mg/dL — ABNORMAL LOW (ref >40)
LDL Cholesterol: 39 mg/dL (ref 0–99)
Total CHOL/HDL Ratio: 2.9 RATIO
Triglycerides: 107 mg/dL (ref <150)
VLDL: 21 mg/dL (ref 0–40)

## 2019-07-31 LAB — HIV ANTIBODY (ROUTINE TESTING W REFLEX): HIV Screen 4th Generation wRfx: NONREACTIVE

## 2019-07-31 LAB — CBG MONITORING, ED
Glucose-Capillary: 102 mg/dL — ABNORMAL HIGH (ref 70–99)
Glucose-Capillary: 122 mg/dL — ABNORMAL HIGH (ref 70–99)
Glucose-Capillary: 147 mg/dL — ABNORMAL HIGH (ref 70–99)
Glucose-Capillary: 141 mg/dL — ABNORMAL HIGH (ref 70–99)
Glucose-Capillary: 155 mg/dL — ABNORMAL HIGH (ref 70–99)
Glucose-Capillary: 138 mg/dL — ABNORMAL HIGH (ref 70–99)

## 2019-07-31 LAB — APTT: aPTT: 36 seconds (ref 24–36)

## 2019-07-31 LAB — HEPARIN LEVEL (UNFRACTIONATED): Heparin Unfractionated: >2.2 IU/mL — ABNORMAL HIGH (ref 0.30–0.70)

## 2019-07-31 LAB — PROTIME-INR
INR: 1.4 — ABNORMAL HIGH (ref 0.8–1.2)
Prothrombin Time: 17.2 seconds — ABNORMAL HIGH (ref 11.4–15.2)

## 2019-07-31 LAB — HEMOGLOBIN A1C
Hgb A1c MFr Bld: 6.3 % — ABNORMAL HIGH (ref 4.8–5.6)
Mean Plasma Glucose: 134.11 mg/dL

## 2019-07-31 LAB — GLUCOSE, CAPILLARY
Glucose-Capillary: 141 mg/dL — ABNORMAL HIGH (ref 70–99)
Glucose-Capillary: 174 mg/dL — ABNORMAL HIGH (ref 70–99)

## 2019-07-31 LAB — TROPONIN I (HIGH SENSITIVITY): Troponin I (High Sensitivity): 140 ng/L (ref <18)

## 2019-07-31 LAB — FERRITIN: Ferritin: 3918 ng/mL — ABNORMAL HIGH (ref 24–336)

## 2019-07-31 MED ORDER — METOPROLOL SUCCINATE ER 50 MG PO TB24
50.0000 mg | ORAL_TABLET | Freq: Every day | ORAL | Status: DC
  Administered 2019-08-02 – 2019-08-11 (×9): 50 mg via ORAL
  Filled 2019-07-31 (×10): qty 1

## 2019-07-31 MED ORDER — ASPIRIN 81 MG PO CHEW
81.0000 mg | CHEWABLE_TABLET | Freq: Every day | ORAL | Status: DC
  Administered 2019-08-02 – 2019-09-22 (×52): 81 mg via ORAL
  Filled 2019-07-31 (×52): qty 1

## 2019-07-31 MED ORDER — APIXABAN 5 MG PO TABS: 5.0000 mg | ORAL_TABLET | Freq: Two times a day (BID) | ORAL | Status: DC

## 2019-07-31 MED ORDER — STROKE: EARLY STAGES OF RECOVERY BOOK
Freq: Once | Status: DC
  Filled 2019-07-31: qty 1

## 2019-07-31 MED ORDER — ATORVASTATIN CALCIUM 40 MG PO TABS
40.0000 mg | ORAL_TABLET | Freq: Every day | ORAL | Status: DC
  Administered 2019-08-02 – 2019-09-22 (×52): 40 mg via ORAL
  Filled 2019-07-31 (×52): qty 1

## 2019-07-31 MED ORDER — CALCIUM ACETATE (PHOS BINDER) 667 MG PO CAPS
1334.0000 mg | ORAL_CAPSULE | Freq: Three times a day (TID) | ORAL | Status: DC
  Administered 2019-08-02 – 2019-08-19 (×45): 1334 mg via ORAL
  Filled 2019-07-31 (×44): qty 2

## 2019-07-31 MED ORDER — SEVELAMER CARBONATE 2.4 G PO PACK
2.4000 g | PACK | Freq: Three times a day (TID) | ORAL | Status: DC
  Administered 2019-08-02 – 2019-08-19 (×45): 2.4 g via ORAL
  Filled 2019-07-31 (×55): qty 1

## 2019-07-31 MED ORDER — VANCOMYCIN HCL 1500 MG/300ML IV SOLN
1500.0000 mg | Freq: Once | INTRAVENOUS | Status: DC
  Administered 2019-07-31: 1500 mg via INTRAVENOUS
  Filled 2019-07-31: qty 300

## 2019-07-31 MED ORDER — DARBEPOETIN ALFA 100 MCG/0.5ML IJ SOSY
100.0000 ug | PREFILLED_SYRINGE | INTRAMUSCULAR | Status: DC
  Filled 2019-07-31: qty 0.5

## 2019-07-31 MED ORDER — STROKE: EARLY STAGES OF RECOVERY BOOK
Freq: Once | Status: AC
  Filled 2019-07-31: qty 1

## 2019-07-31 MED ORDER — SENNA 8.6 MG PO TABS
1.0000 | ORAL_TABLET | ORAL | Status: DC | PRN
  Administered 2019-08-17: 8.6 mg via ORAL
  Filled 2019-07-31: qty 1

## 2019-07-31 MED ORDER — HEPARIN (PORCINE) 25000 UT/250ML-% IV SOLN
1250.0000 [IU]/h | INTRAVENOUS | Status: DC
  Administered 2019-07-31: 1250 [IU]/h via INTRAVENOUS
  Filled 2019-07-31: qty 250

## 2019-07-31 MED ORDER — METOCLOPRAMIDE HCL 5 MG PO TABS
5.0000 mg | ORAL_TABLET | Freq: Three times a day (TID) | ORAL | Status: DC
  Administered 2019-08-02 – 2019-08-21 (×49): 5 mg via ORAL
  Filled 2019-07-31 (×50): qty 1

## 2019-07-31 MED ORDER — VANCOMYCIN VARIABLE DOSE PER UNSTABLE RENAL FUNCTION (PHARMACIST DOSING): Status: DC

## 2019-07-31 MED ORDER — SODIUM CHLORIDE 0.9 % IV SOLN
1.0000 g | INTRAVENOUS | Status: DC
  Filled 2019-07-31: qty 1

## 2019-07-31 MED ORDER — ALLOPURINOL 100 MG PO TABS
100.0000 mg | ORAL_TABLET | Freq: Every day | ORAL | Status: DC
  Administered 2019-08-02 – 2019-09-22 (×52): 100 mg via ORAL
  Filled 2019-07-31 (×52): qty 1

## 2019-07-31 MED ORDER — CHLORHEXIDINE GLUCONATE CLOTH 2 % EX PADS
6.0000 | MEDICATED_PAD | Freq: Every day | CUTANEOUS | Status: DC
  Administered 2019-08-01 – 2019-08-11 (×9): 6 via TOPICAL

## 2019-07-31 NOTE — Progress Notes (Signed)
Patient's wife called & updated on status.

## 2019-07-31 NOTE — Progress Notes (Addendum)
ANTICOAGULATION CONSULT NOTE - Initial Consult  Pharmacy Consult for Heparin Indication: LV thrombus  No Known Allergies  Patient Measurements: Height: 5\' 11"  (180.3 cm) Weight: 180 lb (81.6 kg) IBW/kg (Calculated) : 75.3  Vital Signs: BP: 103/61 (12/23 0700) Pulse Rate: 102 (12/22 2000)  Labs: Recent Labs    07/30/19 1843 07/31/19 0559 07/31/19 0634  HGB 9.6*  --   --   HCT 29.3*  --   --   PLT 251  --   --   APTT  --   --  36  LABPROT  --   --  17.2*  INR  --   --  1.4*  CREATININE 7.21*  --   --   TROPONINIHS 167* 140*  --     Estimated Creatinine Clearance: 10.7 mL/min (A) (by C-G formula based on SCr of 7.21 mg/dL (H)).   Medical History: Past Medical History:  Diagnosis Date  . Diabetes mellitus without complication ([place])   . Hypertension   . Renal disorder 2015   right kidney transplant  . Stroke [facility])     Medications:  No current facility-administered medications on file prior to encounter.   Current Outpatient Medications on File Prior to Encounter  Medication Sig Dispense Refill  . allopurinol (ZYLOPRIM) 100 MG tablet Take 100 mg by mouth daily.     Marland Kitchen apixaban (ELIQUIS) 5 MG TABS tablet Take 5 mg by mouth 2 (two) times daily.     Marland Kitchen aspirin 81 MG chewable tablet Chew 81 mg by mouth daily.     Marland Kitchen atorvastatin (LIPITOR) 40 MG tablet Take 40 mg by mouth daily.    . B Complex-C-Folic Acid (RENA-VITE RX) 1 MG TABS Take 1 tablet by mouth daily.    . calcium acetate (PHOSLO) 667 MG capsule Take 1,334 mg by mouth 3 (three) times daily with meals.     . Insulin Pen Needle (BD PEN NEEDLE NANO U/F) 32G X 4 MM MISC U TO INJECT 5 TIMES A DAY    . lidocaine-prilocaine (EMLA) cream Apply 1 application topically 3 (three) times a week. 30 minutes prior to Dialysis  11  . losartan (COZAAR) 25 MG tablet Take 25 mg by mouth daily.     . metoCLOPramide (REGLAN) 5 MG tablet Take 5 mg by mouth 3 (three) times daily with meals.    . metoprolol succinate (TOPROL-XL) 50 MG  24 hr tablet Take 50 mg by mouth daily.     Marland Kitchen NOVOLOG FLEXPEN 100 UNIT/ML FlexPen Inject 0-10 Units into the skin 2 (two) times daily before a meal. Per sliding scale  0  . predniSONE (DELTASONE) 5 MG tablet TAKE 1 TABLET BY MOUTH EVERY DAY    . senna (SENOKOT) 8.6 MG tablet Take 1 tablet by mouth as needed for constipation.    . sevelamer carbonate (RENVELA) 2.4 g PACK Take 2.4 g by mouth 3 (three) times daily with meals.     . TRESIBA FLEXTOUCH 200 UNIT/ML SOPN Inject 12 Units into the skin daily.   9     Assessment: 68 y.o. male admitted with acute respiratory failure, h/o LV thrombus and Eliquis on hold, for heparin   Goal of Therapy:  APTT 66-102 sec Heparin level 0.3-0.7 units/ml Monitor platelets by anticoagulation protocol: Yes   Plan:  Start heparin 1250 units/hr APTT in 8 hours  Keziah Avis, Bronson Curb 07/31/2019,7:42 AM

## 2019-07-31 NOTE — Consult Note (Signed)
[facility] Renal Consultation Note    Indication for Consultation:  Management of ESRD/hemodialysis; anemia, hypertension/volume and secondary hyperparathyroidism PCP:  HPI: Jonathan Schulenberg Sr. is a 68 y.o. male with ESRD secondary to prior failed tx, with DM, HTN, hx CVA, hx MI, treated HCV, hx left apical thrombus who was diagnosed with COVID 12/16 and has been dialyzing at [facility] since then- he has small gains and leaving below edw to the extent he is even coming in below edw- he has been shortening treatments about 45 mins.  He has been using his AVF with 16 g needles at 300.  Review of notes in Epic indicate he was brought from his wife yesterday from dialysis with a history of swallowing difficulties for 2-3 weeks.  She noticed that he had been pocketing foods and less focal and more lethargic and less verbally interactive. MRI of the brain showed small acute infarcts in the left pons, left occipital lob and right frontal lobe in addition to chronic infarcts  Post HD chemistries were unremarkable. hgb 9.6 WBC normal  CXR showed patchy PNA.  Past Medical History:  Diagnosis Date  . Diabetes mellitus without complication ([place])   . Hypertension   . Renal disorder 2015   right kidney transplant  . Stroke [facility]    Past Surgical History:  Procedure Laterality Date  . BACK SURGERY     Has had 2 back surgeries  . Depression    . HAND SURGERY Right   . HAND SURGERY    . KIDNEY TRANSPLANT     November 2015  . Memory loss    . NEPHRECTOMY TRANSPLANTED ORGAN     Family History  Problem Relation Age of Onset  . Diabetes Mellitus II Mother   . Diabetes Mellitus II Brother    Social History:  reports that he has quit smoking. He has never used smokeless tobacco. He reports that he does not drink alcohol or use drugs. No Known Allergies Prior to Admission medications   Medication Sig Start Date End Date Taking? Authorizing Provider  allopurinol (ZYLOPRIM) 100 MG tablet  Take 100 mg by mouth daily.  06/09/14  Yes [provider]  apixaban (ELIQUIS) 5 MG TABS tablet Take 5 mg by mouth 2 (two) times daily.  03/26/18  Yes [provider]  aspirin 81 MG chewable tablet Chew 81 mg by mouth daily.  06/09/14  Yes [provider]  atorvastatin (LIPITOR) 40 MG tablet Take 40 mg by mouth daily.   Yes [provider]  B Complex-C-Folic Acid (RENA-VITE RX) 1 MG TABS Take 1 tablet by mouth daily. 05/21/19  Yes [provider]  calcium acetate (PHOSLO) 667 MG capsule Take 1,334 mg by mouth 3 (three) times daily with meals.    Yes [provider]  insulin degludec (TRESIBA FLEXTOUCH) 100 UNIT/ML SOPN FlexTouch Pen Inject 12 Units into the skin daily.   Yes [provider]  lidocaine-prilocaine (EMLA) cream Apply 1 application topically 3 (three) times a week. 30 minutes prior to Dialysis 05/08/18  Yes [provider]  losartan (COZAAR) 25 MG tablet Take 25 mg by mouth daily.  03/29/19  Yes [provider]  metoCLOPramide (REGLAN) 5 MG tablet Take 5 mg by mouth 3 (three) times daily with meals.   Yes [provider]  metoprolol succinate (TOPROL-XL) 50 MG 24 hr tablet Take 50 mg by mouth daily.  06/09/14  Yes [provider]  NOVOLOG FLEXPEN 100 UNIT/ML FlexPen Inject 0-10  Units into the skin 2 (two) times daily before a meal. Per sliding scale 05/29/17  Yes [provider]  predniSONE (DELTASONE) 5 MG tablet Take 5 mg by mouth daily with breakfast.  03/17/17  Yes [provider]  senna (SENOKOT) 8.6 MG tablet Take 1 tablet by mouth as needed for constipation.   Yes [provider]  sevelamer carbonate (RENVELA) 2.4 g PACK Take 2.4 g by mouth 3 (three) times daily with meals.  05/22/19  Yes [provider]  Insulin Pen Needle (BD PEN NEEDLE NANO U/F) 32G X 4 MM MISC U TO INJECT 5 TIMES A DAY 09/10/16   [provider]   Current Facility-Administered  Medications  Medication Dose Route Frequency Provider Last Rate Last Admin  .  stroke: mapping our early stages of recovery book   Does not apply Once Rise Patience, MD      . acetaminophen (TYLENOL) tablet 650 mg  650 mg Oral Q6H PRN Rise Patience, MD       Or  . acetaminophen (TYLENOL) suppository 650 mg  650 mg Rectal Q6H PRN Rise Patience, MD      . dexamethasone (DECADRON) injection 6 mg  6 mg Intravenous Q24H Rise Patience, MD   6 mg at 07/30/19 2347  . heparin ADULT infusion 100 units/mL (25000 units/234mL sodium chloride 0.45%)  1,250 Units/hr Intravenous Continuous Tawni Millers, MD 12.5 mL/hr at 07/31/19 1241 1,250 Units/hr at 07/31/19 1241  . insulin aspart (novoLOG) injection 0-6 Units  0-6 Units Subcutaneous Q4H Rise Patience, MD   1 Units at 07/31/19 1453  . metoprolol tartrate (LOPRESSOR) injection 2.5 mg  2.5 mg Intravenous Q6H Rise Patience, MD   2.5 mg at 07/31/19 1236  . ondansetron (ZOFRAN) tablet 4 mg  4 mg Oral Q6H PRN Rise Patience, MD       Or  . ondansetron [facility]) injection 4 mg  4 mg Intravenous Q6H PRN Rise Patience, MD      . remdesivir 100 mg in sodium chloride 0.9 % 100 mL IVPB  100 mg Intravenous Daily Rise Patience, MD   Stopped at 07/31/19 1230   Current Outpatient Medications  Medication Sig Dispense Refill  . allopurinol (ZYLOPRIM) 100 MG tablet Take 100 mg by mouth daily.     Marland Kitchen apixaban (ELIQUIS) 5 MG TABS tablet Take 5 mg by mouth 2 (two) times daily.     Marland Kitchen aspirin 81 MG chewable tablet Chew 81 mg by mouth daily.     Marland Kitchen atorvastatin (LIPITOR) 40 MG tablet Take 40 mg by mouth daily.    . B Complex-C-Folic Acid (RENA-VITE RX) 1 MG TABS Take 1 tablet by mouth daily.    . calcium acetate (PHOSLO) 667 MG capsule Take 1,334 mg by mouth 3 (three) times daily with meals.     . insulin degludec (TRESIBA FLEXTOUCH) 100 UNIT/ML SOPN FlexTouch Pen Inject 12 Units into the skin daily.    Marland Kitchen  lidocaine-prilocaine (EMLA) cream Apply 1 application topically 3 (three) times a week. 30 minutes prior to Dialysis  11  . losartan (COZAAR) 25 MG tablet Take 25 mg by mouth daily.     . metoCLOPramide (REGLAN) 5 MG tablet Take 5 mg by mouth 3 (three) times daily with meals.    . metoprolol succinate (TOPROL-XL) 50 MG 24 hr tablet Take 50 mg by mouth daily.     Marland Kitchen NOVOLOG FLEXPEN 100 UNIT/ML FlexPen Inject 0-10 Units into  the skin 2 (two) times daily before a meal. Per sliding scale  0  . predniSONE (DELTASONE) 5 MG tablet Take 5 mg by mouth daily with breakfast.     . senna (SENOKOT) 8.6 MG tablet Take 1 tablet by mouth as needed for constipation.    . sevelamer carbonate (RENVELA) 2.4 g PACK Take 2.4 g by mouth 3 (three) times daily with meals.     . Insulin Pen Needle (BD PEN NEEDLE NANO U/F) 32G X 4 MM MISC U TO INJECT 5 TIMES A DAY     Labs: Basic Metabolic Panel: Recent Labs  Lab 07/30/19 1843  NA 137  K 3.7  CL 93*  CO2 28  GLUCOSE 144*  BUN 29*  CREATININE 7.21*  CALCIUM 7.4*   Liver Function Tests: Recent Labs  Lab 07/30/19 1843  AST 26  ALT 20  ALKPHOS 43  BILITOT 0.8  PROT 6.8  ALBUMIN 2.7*   No results for input(s): LIPASE, AMYLASE in the last 168 hours. No results for input(s): AMMONIA in the last 168 hours. CBC: Recent Labs  Lab 07/30/19 1843  WBC 6.8  NEUTROABS 5.6  HGB 9.6*  HCT 29.3*  MCV 103.2*  PLT 251   Cardiac Enzymes: No results for input(s): CKTOTAL, CKMB, CKMBINDEX, TROPONINI in the last 168 hours. CBG: Recent Labs  Lab 07/31/19 0136 07/31/19 0623 07/31/19 0747 07/31/19 1215 07/31/19 1435  GLUCAP 102* 122* 147* 141* 155*   Iron Studies:  Recent Labs    07/30/19 1843  FERRITIN 3,918*   Studies/Results: DG Abd 1 View  Result Date: 07/31/2019 CLINICAL DATA:  Dialysis. Difficulty swallowing. Cephalopathy. COVID-19. EXAM: ABDOMEN - 1 VIEW COMPARISON:  No prior. FINDINGS: Soft tissue structures are unremarkable. No bowel  distention or free air. Stool noted throughout the colon. Aortoiliac atherosclerotic vascular calcification. Surgical clips noted pelvis. Punctate sclerotic density noted over the left pelvis, most likely tiny bone island. Degenerative change lumbar spine. IMPRESSION: No acute abnormality. Electronically Signed   By: Marcello Moores  Register   On: 07/31/2019 07:02   CT HEAD WO CONTRAST  Result Date: 07/31/2019 CLINICAL DATA:  Encephalopathy EXAM: CT HEAD WITHOUT CONTRAST TECHNIQUE: Contiguous axial images were obtained from the base of the skull through the vertex without intravenous contrast. COMPARISON:  None. FINDINGS: Brain: There is no intracranial hemorrhage. No midline shift or other mass effect. There is an old left frontal lobe infarct superimposed on white matter changes of chronic ischemic microangiopathy. There is a small focus of hypoattenuation within the left pons, age indeterminate. Vascular: No hyperdense vessel or unexpected calcification. Skull: Normal. Negative for fracture or focal lesion. Sinuses/Orbits: No acute finding. Other: None. IMPRESSION: 1. Small left pontine infarct, age indeterminate. MRI would provide better temporal characterization. 2. No intracranial hemorrhage. 3. Old left frontal lobe infarct superimposed on chronic ischemic microangiopathy. Electronically Signed   By: Ulyses Jarred M.D.   On: 07/31/2019 02:15   MR BRAIN WO CONTRAST  Result Date: 07/31/2019 CLINICAL DATA:  Stroke follow-up. Increasing confusion. Recently diagnosed with COVID-19 infection. EXAM: MRI HEAD WITHOUT CONTRAST TECHNIQUE: Multiplanar, multiecho pulse sequences of the brain and surrounding structures were obtained without intravenous contrast. COMPARISON:  Head CT 07/31/2019 FINDINGS: Brain: Small acute infarcts are present in the left paracentral pons, subcortical white matter of the superior left occipital lobe, and subcortical white matter or cortex of the anteromedial right frontal lobe. No  intracranial hemorrhage, mass, midline shift, or extra-axial fluid collection is identified. There is a chronic, moderate-sized left frontal  infarct with involvement of the operculum. Additional patchy and confluent T2 hyperintensities elsewhere in the cerebral white matter bilaterally are nonspecific but compatible with severe chronic small vessel ischemic disease. There are small chronic infarcts throughout the cerebral white matter and deep gray nuclei bilaterally as well as in the right pons. There is moderate cerebral atrophy. Vascular: Major intracranial vascular flow voids are preserved. Skull and upper cervical spine: Unremarkable bone marrow signal. Sinuses/Orbits: Left cataract extraction. 8 mm cystic focus lateral to the left globe which may be within the inferior aspect of the lacrimal gland. Mild right frontal and ethmoid sinus mucosal thickening. Clear mastoid air cells. Other: 4.6 cm right occipital scalp lipoma. IMPRESSION: 1. Small acute infarcts in the left pons, left occipital lobe, and left right frontal lobe. 2. 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 Electronically Signed   By: Logan Bores M.D.   On: 07/31/2019 12:18   DG Chest Port 1 View  Result Date: 07/30/2019 CLINICAL DATA:  COVID-19 EXAM: PORTABLE CHEST 1 VIEW COMPARISON:  Radiograph 07/24/2019 FINDINGS: Asymmetric elevation left hemidiaphragm is similar to prior. There are increasing patchy interstitial and airspace opacities throughout both lungs most notably in the left base and periphery of the right lung. A right IJ approach dual lumen dialysis catheter tip terminates at the superior cavoatrial junction. Cardiomediastinal contours are similar to prior counting for differences in technique and patient rotation. No acute osseous or soft tissue abnormality. Degenerative changes are present in the imaged spine and shoulders. IMPRESSION: 1. Increasing patchy interstitial and airspace  opacities throughout both lungs Padda compatible with multifocal pneumonia. 2. Stable asymmetric elevation of the left hemidiaphragm. Electronically Signed   By: Lovena Le M.D.   On: 07/30/2019 19:12    ROS: As per HPI otherwise negative.    Physical Exam: Vitals:   07/31/19 1107 07/31/19 1237 07/31/19 1330 07/31/19 1415  BP: 139/81 (!) 152/78 (!) 144/88 (!) 146/69  Pulse: 78 76  76  Resp:  17 17 20   Temp:      TempSrc:      SpO2: 98% 96%  98%  Weight:      Height:         Patient was not directly examined today because of COVID-19 status in order to minimize exposure and preserve PPE. The physical examination and symptoms were discussed with his ED RN. Dialysis Access: right upper AVF placed 05/09/19  Dialysis Orders:  Currently GO TTS2 - previously [facility] 4.25 hr 400/800 EDW 80  2K 2 Ca - leaving below edw - last post wt 77.3 AVF heparin 80000 venofer 50/week parsabiv 7.5 - no calcitriol Mircera 75 - last 12/17   Assessment/Plan: 1.  COVID - on decadron/remdesivir 2.  New acute infarcts - likely contributing to weight loss -stroke team following 3.  ESRD -  TTS - HD tomorrow - no acute need today - use 15 g at 300 of relatively new AVF 4.  Hypertension/volume  -IV meds for now - lower volume with HD 5.  Anemia  - hgb stable 9.6 - due for redose ESA 12/30 6.  Metabolic bone disease -  Off calcitriol - parsabiv not available here - follow labs - start binders when eating 6.    Nutrition - needs swallowing eval - having unintentional weight loss due to CVA and/or COVID 7.    DM - per primary  Myriam Jacobson, PA-C [place] [phone] 07/31/2019, 3:07 PM

## 2019-07-31 NOTE — Progress Notes (Signed)
Pharmacy Antibiotic Note  Jonathan Gary Sr. is a 68 y.o. male admitted on 07/30/2019 with pneumonia.  Pharmacy has been consulted for Vancomycin and Cefepime dosing.  Patient with ESRD normal dialysis days T, TH, Sat.  Plan: Start Cefepime 1000 mg IV q24hr Will give Vancomycin 1500 mg IV x 1, then vanc variable dosing based on levels and whether or not patient receives dialysis. Monitor dialysis days, clinical status, C&S, and vanc levels as needed  Height: 5\' 11"  (180.3 cm) Weight: 180 lb (81.6 kg) IBW/kg (Calculated) : 75.3  Temp (24hrs), Avg:98.2 F (36.8 C), Min:98.2 F (36.8 C), Max:98.2 F (36.8 C)  Recent Labs  Lab 07/30/19 1843  WBC 6.8  CREATININE 7.21*  LATICACIDVEN 2.9*    Estimated Creatinine Clearance: 10.7 mL/min (A) (by C-G formula based on SCr of 7.21 mg/dL (H)).    No Known Allergies  Antimicrobials this admission: Vanc 12/23 >>  Cefepime 12/23 >>  Thank you for allowing pharmacy to be a part of this patient's care.  Alanda Slim, PharmD, [facility] Clinical Pharmacist Please see AMION for all Pharmacists' Contact Phone Numbers 07/31/2019, 7:47 AM

## 2019-07-31 NOTE — ED Notes (Signed)
Checked CBG 102

## 2019-07-31 NOTE — Progress Notes (Signed)
PROGRESS NOTE    Jonathan Poelman Sr.  [id number] DOB: 07-Dec-1950 DOA: 07/30/2019 PCP: Marton Redwood, MD    Brief Narrative:  68 year old male who presented with difficulty swallowing.  He does have significant past medical history for end-stage renal disease on hemodialysis (T, TH, Sat), history of renal transplant, history of stroke, hypertension, type 2 diabetes mellitus, and left ventricle thrombus.  His wife noticed him having difficulty swallowing for about 2 weeks.  He tested positive for COVID-19 December 16.  For the last 4 days patient has been noted to have dyspnea, lethargy and confusion.  Patient missed his dialysis session on the day of admission.  On his initial physical examination his heart rate was 102, respiratory rate 28, oxygen saturation 80%, his lungs had no rales or rhonchi, heart S1-S2 present and rhythmic, abdomen soft, no lower extremity edema, patient was nonfocal. Sodium 137, potassium 3.7, chloride 93, bicarb 28, glucose 144, BUN 29, creatinine 7.2, white count 6.8, hemoglobin 9.6, hematocrit 29.3, platelets 251.  Head CT with small left pontine infarct, age indeterminate.  No intracranial hemorrhage.  Old left frontal lobe infarct superimposed on chronic ischemic microangiopathy.  Chest radiograph with right upper lobe, right lower lobe and left lower lobe interstitial infiltrate. EKG 81 bpm, normal axis, QTC 526, sinus rhythm, septal Q waves, no ST segment changes, negative T lead II, III, aVF and through the precordium.  Low voltage  Patient was admitted to the hospital with a working diagnosis of acute hypoxic respiratory failure due to SARS COVID-19 viral pneumonia.  Assessment & Plan:   Principal Problem:   Acute respiratory failure due to COVID-19 [facility]) Active Problems:   Chronic hepatitis C without hepatic coma ([name])   Diabetes mellitus type 2, uncomplicated ([place])   History of coronary artery disease   Hypertension   ESRD (end stage renal disease) on  dialysis ([place])   Acute respiratory failure with hypoxia ([place])   1. Acute hypoxic respiratory failure due to SARS COVID 19 viral pneumonia.   RR: 17  Pulse oxymetry: 96%  Fi02: 2 L/ min per [place].  COVID-19 Labs  Recent Labs    07/30/19 1843  DDIMER 1.12*  FERRITIN 3,918*  LDH 457*  CRP 12.8*    Lab Results  Component Value Date   SARSCOV2NAA POSITIVE (A) 07/24/2019    Will continue medical therapy with remdesivir #1/5, systemic corticosteroids with dexamethasone 6 mg IV q 24H. As needed bronchodilators and antitussive agents.   Keep oxygen saturation greater than 88%.   Troponin I elevation 167-140 due to acute viral infection, EKG with no acute changes, ruled out ACS.   2. Acute CVA. Further work up with brain MRI with small acute infarcts in the left pons, left occipital lobe and bilateral frontal lobes. Multiple chronic infarcts. Will continue neuro checks per unit protocol. Continue neuro work up with echocardiogram (possible embolic phenomena), and US carotids, continue anticoagulation and statin therapy, plus asa. Will consult neurology in am. Follow with physical/ occupational therapy and speech therapy evaluation   3. HTN. Blood pressure 117 mmHg, will continue close monitoring.   4.  Uncontrolled T2DM. Will continue glucose cover and monitoring. Patient with poor oral intake.   5. ESRD on HD, anemia of chronic renal disease/ failed renal transplant. Patient clinically euvolemic, will follow with nephrology recommendations, for further HD during his hospitalization.   6. LV thrombus. Continue anticoagulation and will follow on echocardiogram. Continue anticoagulation with apixaban for now. Dc heparin drip.     DVT  prophylaxis: apixaban    Code Status:  full Family Communication: no family at the bedside Disposition Plan/ discharge barriers: pending clinical improvement     Subjective: Patient is feeling better, but still not back to baseline, continue to have  dyspnea but his mentation seems to be improving, no nausea or vomiting.   Objective: Vitals:   07/31/19 0345 07/31/19 0600 07/31/19 0615 07/31/19 0700  BP: (!) 142/90 129/67 131/85 103/61  Pulse:      Resp: (!) 23 (!) 22 17 (!) 22  Temp:      TempSrc:      SpO2:    100%  Weight:    81.6 kg  Height:    5\' 11"  (1.803 m)    Intake/Output Summary (Last 24 hours) at 07/31/2019 0804 Last data filed at 07/31/2019 0112 Gross per 24 hour  Intake 299.51 ml  Output --  Net 299.51 ml   Filed Weights   07/31/19 0700  Weight: 81.6 kg    Examination:   General: deconditioned and ill looking appearing Neurology: Awake and alert, non focal/ limited speech.  E ENT: positive pallor, no icterus, oral mucosa moist Cardiovascular: No JVD. S1-S2 present, rhythmic. No lower extremity edema. Pulmonary: positive breath sounds bilaterally. Gastrointestinal. Abdomen with no organomegaly, non tender, no rebound or guarding Skin. No rashes Musculoskeletal: no joint deformities     Data Reviewed: I have personally reviewed following labs and imaging studies  CBC: Recent Labs  Lab 07/30/19 1843  WBC 6.8  NEUTROABS 5.6  HGB 9.6*  HCT 29.3*  MCV 103.2*  PLT 123XX123   Basic Metabolic Panel: Recent Labs  Lab 07/30/19 1843  NA 137  K 3.7  CL 93*  CO2 28  GLUCOSE 144*  BUN 29*  CREATININE 7.21*  CALCIUM 7.4*   GFR: Estimated Creatinine Clearance: 10.7 mL/min (A) (by C-G formula based on SCr of 7.21 mg/dL (H)). Liver Function Tests: Recent Labs  Lab 07/30/19 1843  AST 26  ALT 20  ALKPHOS 43  BILITOT 0.8  PROT 6.8  ALBUMIN 2.7*   No results for input(s): LIPASE, AMYLASE in the last 168 hours. No results for input(s): AMMONIA in the last 168 hours. Coagulation Profile: Recent Labs  Lab 07/31/19 0634  INR 1.4*   Cardiac Enzymes: No results for input(s): CKTOTAL, CKMB, CKMBINDEX, TROPONINI in the last 168 hours. BNP (last 3 results) No results for input(s): PROBNP in the  last 8760 hours. HbA1C: Recent Labs    07/31/19 0559  HGBA1C 6.3*   CBG: Recent Labs  Lab 07/30/19 2336 07/31/19 0136 07/31/19 0623 07/31/19 0747  GLUCAP 96 102* 122* 147*   Lipid Profile: Recent Labs    07/30/19 1843 07/31/19 0559  CHOL  --  91  HDL  --  31*  LDLCALC  --  39  TRIG 124 107  CHOLHDL  --  2.9   Thyroid Function Tests: No results for input(s): TSH, T4TOTAL, FREET4, T3FREE, THYROIDAB in the last 72 hours. Anemia Panel: Recent Labs    07/30/19 1843  FERRITIN 3,918*      Radiology Studies: I have reviewed all of the imaging during this hospital visit personally     Scheduled Meds: .  stroke: mapping our early stages of recovery book   Does not apply Once  . dexamethasone (DECADRON) injection  6 mg Intravenous Q24H  . insulin aspart  0-6 Units Subcutaneous Q4H  . metoprolol tartrate  2.5 mg Intravenous Q6H  . vancomycin variable dose per unstable renal  function (pharmacist dosing)   Does not apply See admin instructions   Continuous Infusions: . ceFEPime (MAXIPIME) IV    . heparin    . remdesivir 100 mg in NS 100 mL    . vancomycin       LOS: 1 day        Jonathan Stern Gerome Apley, MD

## 2019-07-31 NOTE — ED Notes (Signed)
Pt transported to MRI 

## 2019-07-31 NOTE — ED Notes (Signed)
Patient transported to CT 

## 2019-07-31 NOTE — ED Notes (Signed)
Check CBG 122

## 2019-08-01 ENCOUNTER — Inpatient Hospital Stay (HOSPITAL_COMMUNITY): Payer: Medicare Other

## 2019-08-01 ENCOUNTER — Encounter (HOSPITAL_COMMUNITY): Payer: Self-pay | Admitting: Internal Medicine

## 2019-08-01 DIAGNOSIS — I340 Nonrheumatic mitral (valve) insufficiency: Secondary | ICD-10-CM

## 2019-08-01 DIAGNOSIS — I639 Cerebral infarction, unspecified: Secondary | ICD-10-CM

## 2019-08-01 LAB — CBC
HCT: 30.9 % — ABNORMAL LOW (ref 39.0–52.0)
Hemoglobin: 10.2 g/dL — ABNORMAL LOW (ref 13.0–17.0)
MCH: 34.2 pg — ABNORMAL HIGH (ref 26.0–34.0)
MCHC: 33 g/dL (ref 30.0–36.0)
MCV: 103.7 fL — ABNORMAL HIGH (ref 80.0–100.0)
Platelets: 361 10*3/uL (ref 150–400)
RBC: 2.98 MIL/uL — ABNORMAL LOW (ref 4.22–5.81)
RDW: 14.6 % (ref 11.5–15.5)
WBC: 7 10*3/uL (ref 4.0–10.5)
nRBC: 0.4 % — ABNORMAL HIGH (ref 0.0–0.2)

## 2019-08-01 LAB — COMPREHENSIVE METABOLIC PANEL
ALT: 17 U/L (ref 0–44)
AST: 17 U/L (ref 15–41)
Albumin: 2.4 g/dL — ABNORMAL LOW (ref 3.5–5.0)
Alkaline Phosphatase: 39 U/L (ref 38–126)
Anion gap: 22 — ABNORMAL HIGH (ref 5–15)
BUN: 78 mg/dL — ABNORMAL HIGH (ref 8–23)
CO2: 24 mmol/L (ref 22–32)
Calcium: 7.4 mg/dL — ABNORMAL LOW (ref 8.9–10.3)
Chloride: 97 mmol/L — ABNORMAL LOW (ref 98–111)
Creatinine, Ser: 10.3 mg/dL — ABNORMAL HIGH (ref 0.61–1.24)
GFR calc Af Amer: 5 mL/min — ABNORMAL LOW (ref >60)
GFR calc non Af Amer: 5 mL/min — ABNORMAL LOW (ref >60)
Glucose, Bld: 194 mg/dL — ABNORMAL HIGH (ref 70–99)
Potassium: 4.8 mmol/L (ref 3.5–5.1)
Sodium: 143 mmol/L (ref 135–145)
Total Bilirubin: 0.4 mg/dL (ref 0.3–1.2)
Total Protein: 6.2 g/dL — ABNORMAL LOW (ref 6.5–8.1)

## 2019-08-01 LAB — ECHOCARDIOGRAM COMPLETE
Height: 71 in
Weight: 2723.12 oz

## 2019-08-01 LAB — HEPARIN LEVEL (UNFRACTIONATED)
Heparin Unfractionated: >2.2 IU/mL — ABNORMAL HIGH (ref 0.30–0.70)
Heparin Unfractionated: >2.2 IU/mL — ABNORMAL HIGH (ref 0.30–0.70)

## 2019-08-01 LAB — GLUCOSE, CAPILLARY
Glucose-Capillary: 173 mg/dL — ABNORMAL HIGH (ref 70–99)
Glucose-Capillary: 190 mg/dL — ABNORMAL HIGH (ref 70–99)
Glucose-Capillary: 190 mg/dL — ABNORMAL HIGH (ref 70–99)
Glucose-Capillary: 184 mg/dL — ABNORMAL HIGH (ref 70–99)
Glucose-Capillary: 134 mg/dL — ABNORMAL HIGH (ref 70–99)

## 2019-08-01 LAB — LIPID PANEL
Cholesterol: 90 mg/dL (ref 0–200)
HDL: 31 mg/dL — ABNORMAL LOW (ref >40)
LDL Cholesterol: 49 mg/dL (ref 0–99)
Total CHOL/HDL Ratio: 2.9 RATIO
Triglycerides: 52 mg/dL (ref <150)
VLDL: 10 mg/dL (ref 0–40)

## 2019-08-01 LAB — FERRITIN: Ferritin: 4004 ng/mL — ABNORMAL HIGH (ref 24–336)

## 2019-08-01 LAB — C-REACTIVE PROTEIN: CRP: 11.6 mg/dL — ABNORMAL HIGH (ref <1.0)

## 2019-08-01 LAB — D-DIMER, QUANTITATIVE: D-Dimer, Quant: 0.83 ug/mL-FEU — ABNORMAL HIGH (ref 0.00–0.50)

## 2019-08-01 LAB — APTT
aPTT: 35 seconds (ref 24–36)
aPTT: 141 seconds — ABNORMAL HIGH (ref 24–36)

## 2019-08-01 MED ORDER — HEPARIN (PORCINE) 25000 UT/250ML-% IV SOLN
850.0000 [IU]/h | INTRAVENOUS | Status: DC
  Administered 2019-08-01 – 2019-08-02 (×2): 850 [IU]/h via INTRAVENOUS
  Filled 2019-08-01: qty 250

## 2019-08-01 MED ORDER — RESOURCE THICKENUP CLEAR PO POWD
ORAL | Status: DC | PRN
  Filled 2019-08-01 (×2): qty 125

## 2019-08-01 MED ORDER — HEPARIN (PORCINE) 25000 UT/250ML-% IV SOLN
1100.0000 [IU]/h | INTRAVENOUS | Status: DC
  Administered 2019-08-01: 1100 [IU]/h via INTRAVENOUS
  Filled 2019-08-01: qty 250

## 2019-08-01 MED ORDER — DARBEPOETIN ALFA 100 MCG/0.5ML IJ SOSY
PREFILLED_SYRINGE | INTRAMUSCULAR | Status: AC
  Administered 2019-08-01: 100 ug via INTRAVENOUS
  Filled 2019-08-01: qty 0.5

## 2019-08-01 MED ORDER — DARBEPOETIN ALFA 100 MCG/0.5ML IJ SOSY
PREFILLED_SYRINGE | INTRAMUSCULAR | Status: AC
  Filled 2019-08-01: qty 0.5

## 2019-08-01 MED ORDER — HEPARIN SODIUM (PORCINE) 1000 UNIT/ML DIALYSIS: 20.0000 [IU]/kg | INTRAMUSCULAR | Status: DC | PRN

## 2019-08-01 MED ORDER — PERFLUTREN LIPID MICROSPHERE
1.0000 mL | INTRAVENOUS | Status: AC | PRN
  Administered 2019-08-01: 3 mL via INTRAVENOUS
  Filled 2019-08-01: qty 10

## 2019-08-01 MED ORDER — HEPARIN SODIUM (PORCINE) 1000 UNIT/ML DIALYSIS: 1000.0000 [IU] | INTRAMUSCULAR | Status: DC | PRN

## 2019-08-01 MED ORDER — PENTAFLUOROPROP-TETRAFLUOROETH EX AERO: 1.0000 "application " | INHALATION_SPRAY | CUTANEOUS | Status: DC | PRN

## 2019-08-01 MED ORDER — SODIUM CHLORIDE 0.9 % IV SOLN: 100.0000 mL | INTRAVENOUS | Status: DC | PRN

## 2019-08-01 MED ORDER — LIDOCAINE HCL (PF) 1 % IJ SOLN
5.0000 mL | INTRAMUSCULAR | Status: DC | PRN
  Filled 2019-08-01: qty 5

## 2019-08-01 MED ORDER — LIDOCAINE-PRILOCAINE 2.5-2.5 % EX CREA: 1.0000 "application " | TOPICAL_CREAM | CUTANEOUS | Status: DC | PRN

## 2019-08-01 MED ORDER — IOHEXOL 350 MG/ML SOLN
75.0000 mL | Freq: Once | INTRAVENOUS | Status: AC | PRN
  Administered 2019-08-01: 75 mL via INTRAVENOUS

## 2019-08-01 NOTE — Progress Notes (Signed)
PROGRESS NOTE    Jonathan Kett Sr.  [id number] DOB: 1951-05-08 DOA: 07/30/2019 PCP: Marton Redwood, MD    Brief Narrative:  68 year old male who presented with difficulty swallowing.  He does have significant past medical history for end-stage renal disease on hemodialysis (T, TH, Sat), history of renal transplant, history of stroke, hypertension, type 2 diabetes mellitus, and left ventricle thrombus.  His wife noticed him having difficulty swallowing for about 2 weeks.  He tested positive for COVID-19 December 16.  For the last 4 days patient has been noted to have dyspnea, lethargy and confusion.  Patient missed his dialysis session on the day of admission.  On his initial physical examination his heart rate was 102, respiratory rate 28, oxygen saturation 80%, his lungs had no rales or rhonchi, heart S1-S2 present and rhythmic, abdomen soft, no lower extremity edema, patient was nonfocal. Sodium 137, potassium 3.7, chloride 93, bicarb 28, glucose 144, BUN 29, creatinine 7.2, white count 6.8, hemoglobin 9.6, hematocrit 29.3, platelets 251.  Head CT with small left pontine infarct, age indeterminate.  No intracranial hemorrhage.  Old left frontal lobe infarct superimposed on chronic ischemic microangiopathy.  Chest radiograph with right upper lobe, right lower lobe and left lower lobe interstitial infiltrate. EKG 81 bpm, normal axis, QTC 526, sinus rhythm, septal Q waves, no ST segment changes, negative T lead II, III, aVF and through the precordium.  Low voltage  Patient was admitted to the hospital with a working diagnosis of acute hypoxic respiratory failure due to SARS COVID-19 viral pneumonia.  Troponin I elevation 167-140 due to acute viral infection, EKG with no acute changes, ruled out ACS.  Further work up with brain MRI showed small acute infarcts in the left pons, left occipital lobe and bilateral frontal lobes. Failed swallow evaluation.   Assessment & Plan:   Principal Problem:   Acute respiratory failure due to COVID-19 [facility]) Active Problems:   Chronic hepatitis C without hepatic coma ([name])   Diabetes mellitus type 2, uncomplicated ([place])   History of coronary artery disease   Hypertension   ESRD (end stage renal disease) on dialysis ([place])   Acute respiratory failure with hypoxia ([place])   1. Acute hypoxic respiratory failure due to SARS COVID 19 viral pneumonia.   RR: 15 to 17  Pulse oxymetry: 95%  Fi02: 2 L/ min    COVID-19 Labs  Recent Labs    07/30/19 1843 08/01/19 0340  DDIMER 1.12* 0.83*  FERRITIN 3,918* 4,004*  LDH 457*  --   CRP 12.8* 11.6*    Lab Results  Component Value Date   SARSCOV2NAA POSITIVE (A) 07/24/2019    Stable inflammatory markers.  Tolerating well medical therapy with remdesivir #2/5 (AST 17 and ALT 17), continue with systemic corticosteroids (dexamethasone 6 mg IV q 24H).  Continue with antitussive agents.   Patient unable to take po medications due to swallow dysfunction.   2. Acute CVA. small acute infarcts in the left pons, left occipital lobe and bilateral frontal lobes. Multiple chronic infarcts.   Patient not able to tolerate po apixaban due to risk of aspiration, will change anticoagulation with heparin drip for now, possible embolic phenomena. Follow with US carotids and echocardiogram.   Follow with neurology, physical/ occupational and speech therapy recommendations.   LDL 52. On asa and atorvastatin.   3. HTN. Blood pressure 121 and [id number] mmHg systolic, will continue close monitoring, on metoprolol 50 mg XL.  4.  Uncontrolled T2DM. Patient NPO due to risk of aspiration will  continue glucose cover and monitoring with insulin sliding scale.  5. ESRD on HD, anemia of chronic renal disease/ failed renal transplant. Continue to be euvolemic. Continue phoslo, renvela and aransep. Patient will have HD today.    6. LV thrombus. Will continue anticoagulation with heparin drip, and will follow   Echocardiogram.    Consultants:   Neurology   Nephrology   Procedures:     Antimicrobials:       Subjective: Patient with no new complains, no significant dyspnea or chest pain. Limited communication. He failed swallow evaluation and has been started on heprain drip.   Objective: Vitals:   08/01/19 0430 08/01/19 0517 08/01/19 0628 08/01/19 0749  BP: (!) 102/59 (!) 104/40 94/60 103/73  Pulse: 75  70 69  Resp: 20  17 15   Temp:  (!) 97.5 F (36.4 C)  98.2 F (36.8 C)  TempSrc:  Rectal  Oral  SpO2: 96%  96% 95%  Weight:      Height:        Intake/Output Summary (Last 24 hours) at 08/01/2019 0809 Last data filed at 08/01/2019 0500 Gross per 24 hour  Intake 191.45 ml  Output --  Net 191.45 ml   Filed Weights   07/31/19 0700 07/31/19 1903  Weight: 81.6 kg 77.2 kg    Examination:   General: Not in pain or dyspnea, deconditioned  Neurology: Awake and alert, non focal. Strength preserved all 4 extremities.   E ENT: mild pallor, no icterus, oral mucosa moist Cardiovascular: No JVD. S1-S2 present, rhythmic, no gallops, rubs, or murmurs. No lower extremity edema. Pulmonary: positive breath sounds bilaterally. Gastrointestinal. Abdomen with no organomegaly, non tender, no rebound or guarding Skin. No rashes Musculoskeletal: no joint deformities     Data Reviewed: I have personally reviewed following labs and imaging studies  CBC: Recent Labs  Lab 07/30/19 1843  WBC 6.8  NEUTROABS 5.6  HGB 9.6*  HCT 29.3*  MCV 103.2*  PLT [id number]   Basic Metabolic Panel: Recent Labs  Lab 07/30/19 1843 08/01/19 0340  NA 137 143  K 3.7 4.8  CL 93* 97*  CO2 28 24  GLUCOSE 144* 194*  BUN 29* 78*  CREATININE 7.21* 10.30*  CALCIUM 7.4* 7.4*   GFR: Estimated Creatinine Clearance: 7.5 mL/min (A) (by C-G formula based on SCr of 10.3 mg/dL (H)). Liver Function Tests: Recent Labs  Lab 07/30/19 1843 08/01/19 0340  AST 26 17  ALT 20 17  ALKPHOS 43 39  BILITOT 0.8 0.4   PROT 6.8 6.2*  ALBUMIN 2.7* 2.4*   No results for input(s): LIPASE, AMYLASE in the last 168 hours. No results for input(s): AMMONIA in the last 168 hours. Coagulation Profile: Recent Labs  Lab 07/31/19 0634  INR 1.4*   Cardiac Enzymes: No results for input(s): CKTOTAL, CKMB, CKMBINDEX, TROPONINI in the last 168 hours. BNP (last 3 results) No results for input(s): PROBNP in the last 8760 hours. HbA1C: Recent Labs    07/31/19 0559  HGBA1C 6.3*   CBG: Recent Labs  Lab 07/31/19 1435 07/31/19 1643 07/31/19 2113 07/31/19 2357 08/01/19 0301  GLUCAP 155* 138* 141* 174* 173*   Lipid Profile: Recent Labs    07/31/19 0559 08/01/19 0340  CHOL 91 90  HDL 31* 31*  LDLCALC 39 49  TRIG 107 52  CHOLHDL 2.9 2.9   Thyroid Function Tests: No results for input(s): TSH, T4TOTAL, FREET4, T3FREE, THYROIDAB in the last 72 hours. Anemia Panel: Recent Labs    07/30/19 1843 08/01/19 0340  FERRITIN [id number]* 4,004*      Radiology Studies: I have reviewed all of the imaging during this hospital visit personally     Scheduled Meds: .  stroke: mapping our early stages of recovery book   Does not apply Once  . allopurinol  100 mg Oral Daily  . apixaban  5 mg Oral BID  . aspirin  81 mg Oral Daily  . atorvastatin  40 mg Oral Daily  . calcium acetate  1,334 mg Oral TID WC  . Chlorhexidine Gluconate Cloth  6 each Topical Q0600  . [START ON 08/07/2019] darbepoetin (ARANESP) injection - DIALYSIS  100 mcg Intravenous Q Wed-HD  . dexamethasone (DECADRON) injection  6 mg Intravenous Q24H  . insulin aspart  0-6 Units Subcutaneous Q4H  . metoCLOPramide  5 mg Oral TID WC  . metoprolol succinate  50 mg Oral Daily  . sevelamer carbonate  2.4 g Oral TID WC   Continuous Infusions: . remdesivir 100 mg in NS 100 mL Stopped (07/31/19 1230)     LOS: 2 days        Rudell Ortman Gerome Apley, MD

## 2019-08-01 NOTE — Progress Notes (Addendum)
CCMD notified staff that patient had 18 beats of V-tach; patient asymptomatic; no complaints; MD notified. Will continue to monitor.

## 2019-08-01 NOTE — Evaluation (Signed)
Occupational Therapy Evaluation Patient Details Name: Jonathan Snidow Sr. MRN: [id number] DOB: May 31, 1951 Today's Date: 08/01/2019    History of Present Illness 68 y.o. 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 Diagnosed with COVID 6 days prior to coming to ED, where chest x-ray showed multifocal pneumonia. Admitted 07/30/19 for treatment of acute encephalopathy, difficulty swallowing and pneumonia.MRI of the brain showed small acute infarcts in the left pons, left occipital lob and right frontal lobe in addition to chronic infarcts    Clinical Impression   Pt admitted with above. He demonstrates the below listed deficits and will benefit from continued OT to maximize safety and independence with BADLs.  Pt presents to OT with ataxia, impaired balance, impaired cognition.  He currently requires min - mod A for ADLs and mod A for functional transfers.  PTA, he lived with his wife and son - he is a poor historian so uncertain re: level of assist he required PTA.  Recommend SNF level rehab at discharge to allow him to maximize safety and independence with ADLs.       Follow Up Recommendations  SNF;Supervision/Assistance - 24 hour    Equipment Recommendations  None recommended by OT    Recommendations for Other Services       Precautions / Restrictions Precautions Precautions: Fall Restrictions Weight Bearing Restrictions: No      Mobility Bed Mobility Overal bed mobility: Needs Assistance Bed Mobility: Supine to Sit     Supine to sit: HOB elevated;Mod assist     General bed mobility comments: Pt able to move to seated position with min guard assist, but required max A to scoot fully toward EOB  Transfers Overall transfer level: Needs assistance Equipment used: Rolling walker (2  wheeled) Transfers: Sit to/from [facility] Sit to Stand: Mod assist;+2 physical assistance;+2 safety/equipment Stand pivot transfers: Mod assist;+2 safety/equipment       General transfer comment: Pt required mod verbal cues and physical assist for hand placement as well as assist for forward translation of trunk and assist to move into standing.  Assist provided to safely maneuver RW and control descent into chair      Balance Overall balance assessment: Needs assistance Sitting-balance support: Feet supported Sitting balance-Leahy Scale: Fair Sitting balance - Comments: pt able to maintain EOB sitting while donning socks with close min guard assist    Standing balance support: Bilateral upper extremity supported Standing balance-Leahy Scale: Poor Standing balance comment: reliant on bil. UE support                            ADL either performed or assessed with clinical judgement   ADL Overall ADL's : Needs assistance/impaired Eating/Feeding: Set up;Bed level;Sitting   Grooming: Wash/dry hands;Wash/dry face;Oral care;Minimal assistance;Sitting   Upper Body Bathing: Moderate assistance;Sitting   Lower Body Bathing: Moderate assistance;Sit to/from stand   Upper Body Dressing : Moderate assistance;Sitting   Lower Body Dressing: Moderate assistance;Sit to/from stand   Toilet Transfer: Moderate assistance;Stand-pivot;RW   Toileting- Clothing Manipulation and Hygiene: Maximal assistance;Sit to/from stand       Functional mobility during ADLs: Moderate assistance;Rolling walker General ADL Comments: Pt is very slow to initiate activity and requires mod cues for sequencing and problem solving      Vision Baseline Vision/History: Wears glasses Wears Glasses: At  all times Additional Comments: Pt poor historian and unable to selectively attend to participate in formal testing      Perception     Praxis      Pertinent Vitals/Pain Pain  Assessment: Faces Faces Pain Scale: No hurt     Hand Dominance Right   Extremity/Trunk Assessment Upper Extremity Assessment Upper Extremity Assessment: RUE deficits/detail;LUE deficits/detail RUE Deficits / Details: AROM Grossly WFL.  mild ataxia noted  RUE Coordination: decreased fine motor;decreased gross motor LUE Deficits / Details: Pt limited to ~95-100* shoulder flexion actively.  Ataxia noted  LUE Coordination: decreased fine motor;decreased gross motor   Lower Extremity Assessment Lower Extremity Assessment: Defer to PT evaluation   Cervical / Trunk Assessment Cervical / Trunk Assessment: Other exceptions Cervical / Trunk Exceptions: truncal ataxia noted    Communication Communication Communication: Expressive difficulties(limited interaction )   Cognition Arousal/Alertness: Awake/alert Behavior During Therapy: Flat affect Overall Cognitive Status: Impaired/Different from baseline Area of Impairment: Orientation;Memory;Safety/judgement;Awareness;Following commands;Problem solving;Attention                 Orientation Level: Disoriented to;Person;Time;Place;Situation Current Attention Level: Sustained Memory: Decreased short-term memory Following Commands: Follows one step commands consistently;Follows one step commands with increased time Safety/Judgement: Decreased awareness of safety;Decreased awareness of deficits Awareness: (Pt with no awareness of why he is hospital "for dialysis" ) Problem Solving: Slow processing;Decreased initiation;Difficulty sequencing;Requires verbal cues;Requires tactile cues General Comments: Pt frequently contradicts self when providing h/o.  Thinks he is at [employer], and that he is just spending a night or two in [place].  He required mod cues for problem solving with functional mobility and transfers    General Comments  VSS throughout with pt on RA     Exercises     Shoulder Instructions      Home Living Family/patient  expects to be discharged to:: Private residence Living Arrangements: Spouse/significant other Available Help at Discharge: Family Type of Home: Apartment Home Access: Stairs to enter                         Additional Comments: Pt is poor historian often contradicting info.  No family currently present       Prior Functioning/Environment Level of Independence: Needs assistance  Gait / Transfers Assistance Needed: uses cane for ambulation  ADL's / Homemaking Assistance Needed: wife assists with bathing and dressing, wife drives    Comments: Pt is poor historian.  No family present to confirm info         OT Problem List: Decreased strength;Decreased activity tolerance;Decreased range of motion;Impaired balance (sitting and/or standing);Impaired vision/perception;Decreased coordination;Decreased cognition;Decreased safety awareness;Decreased knowledge of use of DME or AE;Impaired UE functional use      OT Treatment/Interventions: Self-care/ADL training;Neuromuscular education;DME and/or AE instruction;Therapeutic activities;Cognitive remediation/compensation;Visual/perceptual remediation/compensation;Patient/family education;Balance training    OT Goals(Current goals can be found in the care plan section) Acute Rehab OT Goals Patient Stated Goal: Pt unable to state  OT Goal Formulation: With patient Time For Goal Achievement: 08/15/19 Potential to Achieve Goals: Good ADL Goals Pt Will Perform Grooming: with min assist;standing Pt Will Perform Upper Body Bathing: with set-up;sitting Pt Will Perform Lower Body Bathing: with min assist;sit to/from stand Pt Will Perform Upper Body Dressing: with supervision;sitting Pt Will Perform Lower Body Dressing: with min assist;sit to/from stand Pt Will Transfer to Toilet: with min assist;ambulating;regular height toilet;bedside commode;grab bars Pt Will Perform Toileting - Clothing Manipulation and hygiene: with min assist;sit to/from  stand Additional ADL  Goal #1: Pt will sustain attention to familiar ADL task x 5 mins with no more than 1 cue  OT Frequency: Min 2X/week   Barriers to D/C: Decreased caregiver support  unsure if wife able to provide necessary level of assist at discharge        Co-evaluation PT/OT/SLP Co-Evaluation/Treatment: Yes Reason for Co-Treatment: For patient/therapist safety;To address functional/ADL transfers;Necessary to address cognition/behavior during functional activity   OT goals addressed during session: ADL's and self-care      AM-PAC OT "6 Clicks" Daily Activity     Outcome Measure Help from another person eating meals?: A Little Help from another person taking care of personal grooming?: A Little Help from another person toileting, which includes using toliet, bedpan, or urinal?: A Lot Help from another person bathing (including washing, rinsing, drying)?: A Lot Help from another person to put on and taking off regular upper body clothing?: A Lot Help from another person to put on and taking off regular lower body clothing?: A Lot 6 Click Score: 14   End of Session Equipment Utilized During Treatment: Gait belt;Rolling walker Nurse Communication: Mobility status  Activity Tolerance: Patient limited by fatigue Patient left: in chair;with call bell/phone within reach;with chair alarm set  OT Visit Diagnosis: Unsteadiness on feet (R26.81)                Time: [id number] OT Time Calculation (min): 29 min Charges:  OT General Charges $OT Visit: 1 Visit OT Evaluation $OT Eval Moderate Complexity: 1 Mod  Alzena Gerber C., OTR/L Acute Rehabilitation Services Pager [phone] Office [phone]   Lucille Passy M 08/01/2019, 3:25 PM

## 2019-08-01 NOTE — Progress Notes (Addendum)
ANTICOAGULATION CONSULT NOTE - Initial Consult  Pharmacy Consult for Heparin Indication: h/o LV thrombus  No Known Allergies  Patient Measurements: Height: 5\' 11"  (180.3 cm) Weight: 170 lb 3.1 oz (77.2 kg) IBW/kg (Calculated) : 75.3  Vital Signs: Temp: 97.7 F (36.5 C) (12/24 1953) Temp Source: Oral (12/24 1953) BP: 105/47 (12/24 1953) Pulse Rate: 77 (12/24 1953)  Labs: Recent Labs    07/30/19 1843 07/31/19 0559 07/31/19 0634 08/01/19 0340 08/01/19 0936 08/01/19 1212 08/01/19 1420 08/01/19 1910  HGB 9.6*  --   --   --   --  10.2*  --   --   HCT 29.3*  --   --   --   --  30.9*  --   --   PLT 251  --   --   --   --  361  --   --   APTT  --   --  36  --  35  --   --  141*  LABPROT  --   --  17.2*  --   --   --   --   --   INR  --   --  1.4*  --   --   --   --   --   HEPARINUNFRC  --   --  >2.20*  --  >2.20*  --  >2.20*  --   CREATININE 7.21*  --   --  10.30*  --   --   --   --   TROPONINIHS 167* 140*  --   --   --   --   --   --     Estimated Creatinine Clearance: 7.5 mL/min (A) (by C-G formula based on SCr of 10.3 mg/dL (H)).   Medical History: Past Medical History:  Diagnosis Date  . Diabetes mellitus without complication ([place])   . Hypertension   . Renal disorder 2015   right kidney transplant  . Stroke [facility])     Assessment: 68 y.o. 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 Last Eliquis dose ~1000 on 12/22. Patient was temporarily transitioned to a heparin IV infusion, but this was turned off 12/23 at 1735 with intentions of starting Eliquis. Eliquis was ordered inpatient, but has not been given. Pharmacy has now been consulted to start heparin IV infusion in this patient.   This patient has also had an acute CVA with MRI confirmation. Given acute CVA and risk for hemorrhagic conversion, will set aPTT and heparin level at lower end of goal as listed below. CBC stable so far this admission, platelets WNL.  Baseline heparin level before starting infusion was >2.20, indicating that apixaban is still influencing heparin levels at this point, so will monitor anticoagulation using aPTT until apixaban is no longer influencing heparin levels.  aPTT ~9 hrs after starting heparin infusion at 1100 units/hr is 141 sec, which is above the goal range for this pt. Per RN, no issues with IV or bleeding observed.   Goal of Therapy:  aPTT 66-85s Heparin level 0.3-0.5 units/ml Monitor platelets by anticoagulation protocol: Yes   Plan:  Hold heparin infusion for 1 hr, then restart heparin infusion at 850 units/hr Check aPTT, heparin level 8 hrs after restarting heparin infusion Monitor daily aPTT and heparin level until levels coordinate   Monitor daily CBC Monitor for signs/symptoms of bleeding  Gillermina Hu, PharmD, BCPS, [facility] Clinical Pharmacist 08/01/19, 21:00 PM

## 2019-08-01 NOTE — Progress Notes (Signed)
Patient's wife Jaycion Sipos was called and informed on her husband's condition. All questions were answered.

## 2019-08-01 NOTE — Evaluation (Signed)
Physical Therapy Evaluation Patient Details Name: Jonathan Gilbreth Sr. MRN: [id number] DOB: 13-Aug-1950 Today's Date: 08/01/2019   History of Present Illness  68 y.o. 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 Diagnosed with COVID 6 days prior to coming to ED, where chest x-ray showed multifocal pneumonia. Admitted 07/30/19 for treatment of acute encephalopathy, difficulty swallowing and pneumonia.MRI of the brain showed small acute infarcts in the left pons, left occipital lob and right frontal lobe in addition to chronic infarcts    Clinical Impression   Per prior notes pt lives with wife and son. Pt is poor historian but states he lives in an apartment and uses a cane for ambulation. Pt states he is not driving any more. Pt unable to report level assist needed for ADLs and iADLs. Pt is currently limited in safe mobility by decreased cognition and safety awareness in presence of mild ataxia, generalized weakness and decreased balance. PT recommends SNF level rehab to progress mobility for eventual return home. PT will continue to follow acutely.     Follow Up Recommendations SNF    Equipment Recommendations  None recommended by PT    Recommendations for Other Services       Precautions / Restrictions Precautions Precautions: Fall Restrictions Weight Bearing Restrictions: No      Mobility  Bed Mobility Overal bed mobility: Needs Assistance Bed Mobility: Supine to Sit     Supine to sit: HOB elevated;Mod assist     General bed mobility comments: Pt able to move to seated position with min guard assist, but required max A to scoot fully toward EOB  Transfers Overall transfer level: Needs assistance Equipment used: Rolling walker (2 wheeled) Transfers: Sit to/from [facility] Sit  to Stand: Mod assist;+2 physical assistance;+2 safety/equipment Stand pivot transfers: Mod assist;+2 safety/equipment       General transfer comment: Pt required mod verbal cues and physical assist for hand placement as well as assist for forward translation of trunk and assist to move into standing.  Assist provided to safely maneuver RW and control descent into chair    Ambulation/Gait Ambulation/Gait assistance: Mod assist;+2 physical assistance Gait Distance (Feet): 2 Feet Assistive device: Rolling walker (2 wheeled) Gait Pattern/deviations: Decreased step length - right;Decreased step length - left;Ataxic;Step-through pattern;Decreased stride length Gait velocity: slowed Gait velocity interpretation: <1.31 ft/sec, indicative of household ambulator General Gait Details: modA for steadying with slow, slightly ataxic steps from bed to chair     Modified Rankin (Stroke Patients Only) Modified Rankin (Stroke Patients Only) Pre-Morbid Rankin Score: Moderate disability Modified Rankin: Moderately severe disability     Balance Overall balance assessment: Needs assistance Sitting-balance support: Feet supported Sitting balance-Leahy Scale: Fair Sitting balance - Comments: pt able to maintain EOB sitting while donning socks with close min guard assist    Standing balance support: Bilateral upper extremity supported Standing balance-Leahy Scale: Poor Standing balance comment: reliant on bil. UE support                              Pertinent Vitals/Pain Pain Assessment: Faces Faces Pain Scale: No hurt    Home Living Family/patient expects to be discharged to:: Private residence Living Arrangements: Spouse/significant other Available Help at Discharge: Family Type of Home: Apartment Home Access: Stairs to enter  Additional Comments: Pt is poor historian often contradicting info.  No family currently present     Prior Function Level of Independence: Needs  assistance   Gait / Transfers Assistance Needed: uses cane for ambulation   ADL's / Homemaking Assistance Needed: wife assists with bathing and dressing, wife drives   Comments: Pt is poor historian.  No family present to confirm info      Hand Dominance   Dominant Hand: Right    Extremity/Trunk Assessment   Upper Extremity Assessment Upper Extremity Assessment: Defer to OT evaluation RUE Deficits / Details: AROM Grossly WFL.  mild ataxia noted  RUE Coordination: decreased fine motor;decreased gross motor LUE Deficits / Details: Pt limited to ~95-100* shoulder flexion actively.  Ataxia noted  LUE Coordination: decreased fine motor;decreased gross motor    Lower Extremity Assessment Lower Extremity Assessment: Generalized weakness;RLE deficits/detail;LLE deficits/detail RLE Deficits / Details: AROM WFL, strengh grossly 3+/5, slightly ataxic movement  RLE Sensation: decreased light touch RLE Coordination: decreased fine motor LLE Deficits / Details: AROM WFL, strengh grossly 3+/5, slightly ataxic movement LLE Sensation: decreased light touch LLE Coordination: decreased fine motor    Cervical / Trunk Assessment Cervical / Trunk Assessment: Other exceptions Cervical / Trunk Exceptions: truncal ataxia noted   Communication   Communication: Expressive difficulties(limited interaction )  Cognition Arousal/Alertness: Awake/alert Behavior During Therapy: Flat affect Overall Cognitive Status: Impaired/Different from baseline Area of Impairment: Orientation;Memory;Safety/judgement;Awareness;Following commands;Problem solving;Attention                 Orientation Level: Disoriented to;Person;Time;Place;Situation Current Attention Level: Sustained Memory: Decreased short-term memory Following Commands: Follows one step commands consistently;Follows one step commands with increased time Safety/Judgement: Decreased awareness of safety;Decreased awareness of deficits Awareness:  (Pt with no awareness of why he is hospital "for dialysis" ) Problem Solving: Slow processing;Decreased initiation;Difficulty sequencing;Requires verbal cues;Requires tactile cues General Comments: Pt frequently contradicts self when providing h/o.  Thinks he is at [employer], and that he is just spending a night or two in [place].  He required mod cues for problem solving with functional mobility and transfers       General Comments General comments (skin integrity, edema, etc.): VSS throughout with pt on RA         Assessment/Plan    PT Assessment Patient needs continued PT services  PT Problem List Decreased strength;Decreased balance;Decreased mobility;Decreased cognition;Decreased coordination;Decreased safety awareness;Decreased knowledge of use of DME;Impaired sensation       PT Treatment Interventions DME instruction;Gait training;Functional mobility training;Therapeutic activities;Therapeutic exercise;Balance training;Cognitive remediation;Patient/family education    PT Goals (Current goals can be found in the Care Plan section)  Acute Rehab PT Goals Patient Stated Goal: Pt unable to state  PT Goal Formulation: Patient unable to participate in goal setting Potential to Achieve Goals: Good    Frequency Min 2X/week        Co-evaluation PT/OT/SLP Co-Evaluation/Treatment: Yes Reason for Co-Treatment: For patient/therapist safety PT goals addressed during session: Mobility/safety with mobility OT goals addressed during session: ADL's and self-care       AM-PAC PT "6 Clicks" Mobility  Outcome Measure Help needed turning from your back to your side while in a flat bed without using bedrails?: A Little Help needed moving from lying on your back to sitting on the side of a flat bed without using bedrails?: Total Help needed moving to and from a bed to a chair (including a wheelchair)?: A Lot Help needed standing up from a chair using your arms (e.g., wheelchair or  bedside  chair)?: A Lot Help needed to walk in hospital room?: A Lot Help needed climbing 3-5 steps with a railing? : Total 6 Click Score: 11    End of Session Equipment Utilized During Treatment: Gait belt Activity Tolerance: Patient tolerated treatment well Patient left: in chair;with call bell/phone within reach;with chair alarm set Nurse Communication: Mobility status PT Visit Diagnosis: Unsteadiness on feet (R26.81);Other abnormalities of gait and mobility (R26.89);Muscle weakness (generalized) (M62.81);Ataxic gait (R26.0);Difficulty in walking, not elsewhere classified (R26.2)    Time: [id number] PT Time Calculation (min) (ACUTE ONLY): 29 min   Charges:   PT Evaluation $PT Eval Moderate Complexity: 1 Mod          Layni Kreamer B. Migdalia Dk PT, DPT Acute Rehabilitation Services Pager [phone] Office [phone]   [place] 08/01/2019, 3:52 PM

## 2019-08-01 NOTE — Evaluation (Addendum)
Clinical/Bedside Swallow Evaluation Patient Details  Name: Jonathan Doty Sr. MRN: [id number] Date of Birth: 1951/03/22  Today's Date: 08/01/2019 Time: SLP Start Time (ACUTE ONLY): 1433 SLP Stop Time (ACUTE ONLY): 1456 SLP Time Calculation (min) (ACUTE ONLY): 23 min  Past Medical History:  Past Medical History:  Diagnosis Date  . Diabetes mellitus without complication ([place])   . Hypertension   . Renal disorder 2015   right kidney transplant  . Stroke [facility])    Past Surgical History:  Past Surgical History:  Procedure Laterality Date  . BACK SURGERY     Has had 2 back surgeries  . Depression    . HAND SURGERY Right   . HAND SURGERY    . KIDNEY TRANSPLANT     November 2015  . Memory loss    . NEPHRECTOMY TRANSPLANTED ORGAN     HPI:  Jonathan Geissler Sr. is a 68 y.o. male with history of ESRD on hemodialysis, LV thrombus, renal transplant, stroke, hypertension diabetes mellitus was having increasing difficulty swallowing over the last 2 weeks as noticed by patient's wife (pocketing his food. Per chart 6 days ago diagnosed with Covid at the time he was not symptomatic. Now more short of breath and lethargic and confused. 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 Chest x-ray showing multifocal pneumonia.   Assessment / Plan / Recommendation Clinical Impression  Pt alert, mildly delayed responses and oral-motor exam unremarkable for significant weakness. He exhibited prolonged mastication of cracker and chewing like movements after oral cavity cleared, Minimal lingual residue but potential is greater with more solid textures in addition to likely fatigue/decreased endurance. Puree/smoother consistency was easily managed. Instance of probable aspiration and with larger straw bolus thin with suspicion of poor oral control and prematurely falling into airway  evidenced by strong reflexive cough before swallow even occurred. Location of stroke affecting the left pons can impair swallow function. From subjective view he is better able to control and maneiver thicker textures and recommend Dys 1 (puree), nectar thick liquids, crush pills and NO STRAWS. ST will follow.  SLP Visit Diagnosis: Dysphagia, unspecified (R13.10)    Aspiration Risk  Moderate aspiration risk    Diet Recommendation Dysphagia 1 (Puree);Nectar-thick liquid   Liquid Administration via: Cup;No straw Medication Administration: Crushed with puree Supervision: Patient able to self feed;Staff to assist with self feeding;Full supervision/cueing for compensatory strategies Compensations: Slow rate;Minimize environmental distractions;Small sips/bites;Lingual sweep for clearance of pocketing Postural Changes: Seated upright at 90 degrees    Other  Recommendations Oral Care Recommendations: Oral care BID Other Recommendations: Order thickener from pharmacy   Follow up Recommendations Other (comment)(TBD)      Frequency and Duration min 2x/week  2 weeks       Prognosis Prognosis for Safe Diet Advancement: Good Barriers to Reach Goals: Cognitive deficits      Swallow Study   General HPI: Jonathan Bryant Sr. is a 68 y.o. male with history of ESRD on hemodialysis, LV thrombus, renal transplant, stroke, hypertension diabetes mellitus was having increasing difficulty swallowing over the last 2 weeks as noticed by patient's wife (pocketing his food. Per chart 6 days ago diagnosed with Covid at the time he was not symptomatic. Now more short of breath and lethargic and confused. MRI small acute infarcts in the left pons, left occipital lobe, and left right frontal lobe, severe chronic small vessel ischemic disease with  multiple chronic infarcts as above. Chest x-ray showing multifocal pneumonia. Type of Study: Bedside Swallow Evaluation Previous Swallow Assessment: none Diet Prior to this  Study: NPO Temperature Spikes Noted: No Respiratory Status: Nasal cannula History of Recent Intubation: No Behavior/Cognition: Alert;Cooperative;Pleasant mood;Requires cueing Oral Cavity Assessment: Within Functional Limits Oral Care Completed by SLP: No Oral Cavity - Dentition: Other (Comment);Missing dentition(missing posterior molar, mostly intact) Vision: Functional for self-feeding(?) Self-Feeding Abilities: Needs assist;Able to feed self Patient Positioning: Upright in bed Baseline Vocal Quality: Normal Volitional Cough: Strong Volitional Swallow: Able to elicit    Oral/Motor/Sensory Function Overall Oral Motor/Sensory Function: Within functional limits   Ice Chips Ice chips: Not tested   Thin Liquid Thin Liquid: Impaired Presentation: Cup;Straw Pharyngeal  Phase Impairments: Cough - Immediate Other Comments: (oral hesitancy)    Nectar Thick Nectar Thick Liquid: Within functional limits   Honey Thick Honey Thick Liquid: Not tested   Puree Puree: Within functional limits   Solid     Solid: Impaired Oral Phase Impairments: Impaired mastication Oral Phase Functional Implications: Prolonged oral transit(prolonged mastication)      Mick Sell, Orbie Pyo 08/01/2019,3:19 PM  Orbie Pyo Byron.Ed Risk analyst [phone] Office [phone]

## 2019-08-01 NOTE — Consult Note (Signed)
Neurology Consultation Reason for Consult: Stroke Referring Physician: Lurline Del  CC: Stroke  History is obtained from: Patient  HPI: Jonathan Guthrie Sr. is a 68 y.o. male with a history of diabetes, hypertension, previous stroke who presented to the hospital with COVID-19.  He also has been having some trouble with his gait for couple of weeks.  Today, he noticed that his speech was significantly more slurred.  Due to these changes, an MRI was obtained which shows embolic appearing infarcts.  He was found to have a left ventricular thrombus and was started on anticoagulation.  He has a history of stroke with some left-sided deficits.  LKW: Unclear, suspect 2 weeks ago tpa given?: no, outside of window    ROS: A 14 point ROS was performed and is negative except as noted in the HPI.   Past Medical History:  Diagnosis Date  . Diabetes mellitus without complication ([place])   . Hypertension   . Renal disorder 2015   right kidney transplant  . Stroke [facility])      Family History  Problem Relation Age of Onset  . Diabetes Mellitus II Mother   . Diabetes Mellitus II Brother      Social History:  reports that he has quit smoking. He has never used smokeless tobacco. He reports that he does not drink alcohol or use drugs.   Exam: Current vital signs: BP (!) 89/82   Pulse 87   Temp 97.8 F (36.6 C) (Oral)   Resp 20   Ht 5\' 11"  (1.803 m)   Wt 77.2 kg   SpO2 97%   BMI 23.74 kg/m  Vital signs in last 24 hours: Temp:  [97.5 F (36.4 C)-98.6 F (37 C)] 97.8 F (36.6 C) (12/24 1615) Pulse Rate:  [69-100] 87 (12/24 1630) Resp:  [15-38] 20 (12/24 1630) BP: (87-147)/(40-118) 89/82 (12/24 1630) SpO2:  [90 %-100 %] 97 % (12/24 1623) Weight:  [77.2 kg] 77.2 kg (12/23 1903)   Physical Exam  Constitutional: Appears well-developed and well-nourished.  Psych: Affect appropriate to situation Eyes: No scleral injection HENT: No OP obstrucion MSK: no joint deformities.   Cardiovascular: Normal rate and regular rhythm.  Respiratory: Effort normal, non-labored breathing GI: Soft.  No distension. There is no tenderness.  Skin: WDI  Neuro: Mental Status: Patient is awake, alert, gives the year as 2008 and the month is May Cranial Nerves: II: Visual Fields are full. Pupils are equal, round, and reactive to light. III,IV, VI: EOMI without ptosis or diploplia.  V: Facial sensation is symmetric to temperature VII: Facial movement is mildly decreased on the left, he is dysarthric VIII: hearing is intact to voice X: Uvula elevates symmetrically XI: Shoulder shrug is symmetric. XII: tongue is midline without atrophy or fasciculations.  Motor: Tone is normal. Bulk is normal. 4/5 strength was present in left arm and leg Sensory: Sensation is symmetric to light touch in the arms and legs. Cerebellar: He has tremor on finger-nose-finger bilaterally     I have reviewed labs in epic and the results pertinent to this consultation are: Creatinine 10.3 LDL 49  I have reviewed the images obtained: MRI brain-multifocal strokes  Impression: 67 year old male with likely embolic stroke secondary to a left ventricular thrombus.  He has been started on heparin and I would continue this.  He will need PT, OT, etc.  I would also favor vascular imaging.  Recommendations: - Frequent neuro checks - Prophylactic therapy-anticoagulation - Risk factor modification - Telemetry monitoring - PT consult,  OT consult, Speech consult -CTA head and neck - Stroke team to follow    Roland Rack, MD [facility] [phone]  If 7pm- 7am, please page neurology on call as listed in [place].

## 2019-08-01 NOTE — Progress Notes (Signed)
Carotid artery duplex has been completed. Preliminary results can be found in CV Proc through chart review.   08/01/19 10:57 AM Carlos Levering RVT

## 2019-08-01 NOTE — Progress Notes (Signed)
  Echocardiogram 2D Echocardiogram has been performed.  Jonathan [name] 08/01/2019, 1:07 PM

## 2019-08-01 NOTE — Progress Notes (Signed)
Paged MD about patients 10 beats of SVT. Patient was found sleep with no symptoms.    07/31/19 2350  Vitals  Temp 98.6 F (37 C)  Temp Source Oral  BP (!) 126/111  MAP (mmHg) 118  BP Location Right Leg  BP Method Automatic  Patient Position (if appropriate) Lying  Pulse Rate 77  ECG Heart Rate 77  Resp 17  Oxygen Therapy  SpO2 95 %  MEWS Score  MEWS RR 0  MEWS Pulse 0  MEWS Systolic 0  MEWS LOC 0  MEWS Temp 0  MEWS Score 0  MEWS Score Color Jonathan [name]

## 2019-08-01 NOTE — Progress Notes (Signed)
ANTICOAGULATION CONSULT NOTE - Initial Consult  Pharmacy Consult for Heparin Indication: h/o LV thrombus  No Known Allergies  Patient Measurements: Height: 5\' 11"  (180.3 cm) Weight: 170 lb 3.1 oz (77.2 kg) IBW/kg (Calculated) : 75.3  Vital Signs: Temp: 98.2 F (36.8 C) (12/24 0749) Temp Source: Oral (12/24 0749) BP: 103/73 (12/24 0749) Pulse Rate: 69 (12/24 0749)  Labs: Recent Labs    07/30/19 1843 07/31/19 0559 07/31/19 0634 08/01/19 0340  HGB 9.6*  --   --   --   HCT 29.3*  --   --   --   PLT 251  --   --   --   APTT  --   --  36  --   LABPROT  --   --  17.2*  --   INR  --   --  1.4*  --   HEPARINUNFRC  --   --  >2.20*  --   CREATININE 7.21*  --   --  10.30*  TROPONINIHS 167* 140*  --   --     Estimated Creatinine Clearance: 7.5 mL/min (A) (by C-G formula based on SCr of 10.3 mg/dL (H)).   Medical History: Past Medical History:  Diagnosis Date  . Diabetes mellitus without complication ([place])   . Hypertension   . Renal disorder 2015   right kidney transplant  . Stroke [facility])     Medications:  No current facility-administered medications on file prior to encounter.   Current Outpatient Medications on File Prior to Encounter  Medication Sig Dispense Refill  . allopurinol (ZYLOPRIM) 100 MG tablet Take 100 mg by mouth daily.     Marland Kitchen apixaban (ELIQUIS) 5 MG TABS tablet Take 5 mg by mouth 2 (two) times daily.     Marland Kitchen aspirin 81 MG chewable tablet Chew 81 mg by mouth daily.     Marland Kitchen atorvastatin (LIPITOR) 40 MG tablet Take 40 mg by mouth daily.    . B Complex-C-Folic Acid (RENA-VITE RX) 1 MG TABS Take 1 tablet by mouth daily.    . calcium acetate (PHOSLO) 667 MG capsule Take 1,334 mg by mouth 3 (three) times daily with meals.     . insulin degludec (TRESIBA FLEXTOUCH) 100 UNIT/ML SOPN FlexTouch Pen Inject 12 Units into the skin daily.    Marland Kitchen lidocaine-prilocaine (EMLA) cream Apply 1 application topically 3 (three) times a week. 30 minutes prior to Dialysis  11  .  losartan (COZAAR) 25 MG tablet Take 25 mg by mouth daily.     . metoCLOPramide (REGLAN) 5 MG tablet Take 5 mg by mouth 3 (three) times daily with meals.    . metoprolol succinate (TOPROL-XL) 50 MG 24 hr tablet Take 50 mg by mouth daily.     Marland Kitchen NOVOLOG FLEXPEN 100 UNIT/ML FlexPen Inject 0-10 Units into the skin 2 (two) times daily before a meal. Per sliding scale  0  . predniSONE (DELTASONE) 5 MG tablet Take 5 mg by mouth daily with breakfast.     . senna (SENOKOT) 8.6 MG tablet Take 1 tablet by mouth as needed for constipation.    . sevelamer carbonate (RENVELA) 2.4 g PACK Take 2.4 g by mouth 3 (three) times daily with meals.     . Insulin Pen Needle (BD PEN NEEDLE NANO U/F) 32G X 4 MM MISC U TO INJECT 5 TIMES A DAY       Assessment: 68 y.o. male admitted with acute respiratory failure, h/o LV thrombus on Eliquis PTA currently on hold. Last  Eliquis dose ~1000 on 12/22. Patient was temporarily transitioned to a heparin IV infusion, but this was turned off 12/23 at 1735 with intentions of starting Eliquis. Eliquis was ordered inpatient but has not been given. Pharmacy has now been consulted to start heparin IV infusion in this patient.   This patient has also had an acute CVA with MRI confirmation. Given acute CVA and risk for hemorrhagic conversion, will set aPTT and heparin level at lower end of goal as listed below. CBC stable so far this admission, Plt WNL. No bleeding noted.    Goal of Therapy:  APTT aPTT 66-85s Heparin level 0.3-0.5 Monitor platelets by anticoagulation protocol: Yes   Plan:  - Start heparin IV 1100 units/hr - Baseline aPTT and heparin level ordered - APTT and heparin level in 8 hours at 1730 - Monitor daily aPTT and heparin level until levels coordinate - Monitor daily CBC, s/sx of bleeding  Agnes Lawrence, PharmD PGY1 Pharmacy Resident

## 2019-08-01 NOTE — Progress Notes (Signed)
Patient going to dialysis. Alert and oriented; no acute distress noted, no complaints.

## 2019-08-01 NOTE — Progress Notes (Signed)
[facility] Progress Note   Dialysis Orders: Currently GO TTS2 - previously [facility] 4.25 hr 400/800 EDW 80  2K 2 Ca - leaving below edw - last post wt 77.3 AVF heparin 80000 venofer 50/week parsabiv 7.5 - no calcitriol Mircera 75 - last 12/17   Assessment/Plan: 1.  COVID with acute respiratory failure -  - on decadron/remdesivir 2.  New acute infarcts - likely contributing to weight loss -stroke team following 3.  ESRD -  TTS - HD today-  use 16 g at 300 for relatively new AVF -next HD will be Sunday due to holiday schedule 4.  Hypertension/volume  -IV meds for now - lower volume with HD 5.  Anemia  - hgb stable 9.6 - due for redose ESA 12/30 6.  Metabolic bone disease -  Off calcitriol - parsabiv not available here - follow labs - start binders when eating - still NPO - -corrected Ca 8.3 6.    Nutrition - needs swallowing eval - having unintentional weight loss due to CVA/dysphasia PTA and/or COVID 7.    DM - per primary 8.    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 Sats ok . Pharmacy starting IV heparin - hx of LV thrombus BC pending NGTD  Objective Vitals:   08/01/19 0430 08/01/19 0517 08/01/19 0628 08/01/19 0749  BP: (!) 102/59 (!) 104/40 94/60 103/73  Pulse: 75  70 69  Resp: 20  17 15   Temp:  (!) 97.5 F (36.4 C)  98.2 F (36.8 C)  TempSrc:  Rectal  Oral  SpO2: 96%  96% 95%  Weight:      Height:          Patient was not directly examined today because of COVID-19 status in order to minimize exposure and preserve PPE. The physical examination and symptoms were discussed with his ED RN. Dialysis Access: right upper AVF placed 05/09/19  Additional Objective Labs: Basic Metabolic Panel: Recent Labs  Lab 07/30/19 1843 08/01/19 0340  NA 137 143  K 3.7 4.8  CL 93* 97*  CO2 28 24  GLUCOSE 144* 194*  BUN 29* 78*  CREATININE  7.21* 10.30*  CALCIUM 7.4* 7.4*   Liver Function Tests: Recent Labs  Lab 07/30/19 1843 08/01/19 0340  AST 26 17  ALT 20 17  ALKPHOS 43 39  BILITOT 0.8 0.4  PROT 6.8 6.2*  ALBUMIN 2.7* 2.4*   No results for input(s): LIPASE, AMYLASE in the last 168 hours. CBC: Recent Labs  Lab 07/30/19 1843  WBC 6.8  NEUTROABS 5.6  HGB 9.6*  HCT 29.3*  MCV 103.2*  PLT 251   Blood Culture    Component Value Date/Time   SDES BLOOD SITE NOT SPECIFIED 07/30/2019 1843   SPECREQUEST  07/30/2019 1843    BOTTLES DRAWN AEROBIC AND ANAEROBIC Blood Culture results may not be optimal due to an inadequate volume of blood received in culture bottles   CULT  07/30/2019 1843    NO GROWTH 2 DAYS Performed at [facility], [place] [address], [place], [place] [address]    REPTSTATUS PENDING 07/30/2019 1843    Cardiac Enzymes: No results for input(s): CKTOTAL, CKMB, CKMBINDEX, TROPONINI in the last 168 hours. CBG: Recent Labs  Lab 07/31/19 1643 07/31/19 2113 07/31/19 2357 08/01/19 0301 08/01/19 0812  GLUCAP 138* 141* 174* 173* 190*   Iron  Studies:  Recent Labs    08/01/19 0340  FERRITIN 4,004*   Lab Results  Component Value Date   INR 1.4 (H) 07/31/2019   Studies/Results: DG Abd 1 View  Result Date: 07/31/2019 CLINICAL DATA:  Dialysis. Difficulty swallowing. Cephalopathy. COVID-19. EXAM: ABDOMEN - 1 VIEW COMPARISON:  No prior. FINDINGS: Soft tissue structures are unremarkable. No bowel distention or free air. Stool noted throughout the colon. Aortoiliac atherosclerotic vascular calcification. Surgical clips noted pelvis. Punctate sclerotic density noted over the left pelvis, most likely tiny bone island. Degenerative change lumbar spine. IMPRESSION: No acute abnormality. Electronically Signed   By: Marcello Moores  Register   On: 07/31/2019 07:02   CT HEAD WO CONTRAST  Result Date: 07/31/2019 CLINICAL DATA:  Encephalopathy EXAM: CT HEAD WITHOUT CONTRAST TECHNIQUE: Contiguous axial images  were obtained from the base of the skull through the vertex without intravenous contrast. COMPARISON:  None. FINDINGS: Brain: There is no intracranial hemorrhage. No midline shift or other mass effect. There is an old left frontal lobe infarct superimposed on white matter changes of chronic ischemic microangiopathy. There is a small focus of hypoattenuation within the left pons, age indeterminate. Vascular: No hyperdense vessel or unexpected calcification. Skull: Normal. Negative for fracture or focal lesion. Sinuses/Orbits: No acute finding. Other: None. IMPRESSION: 1. Small left pontine infarct, age indeterminate. MRI would provide better temporal characterization. 2. No intracranial hemorrhage. 3. Old left frontal lobe infarct superimposed on chronic ischemic microangiopathy. Electronically Signed   By: Ulyses Jarred M.D.   On: 07/31/2019 02:15   MR BRAIN WO CONTRAST  Result Date: 07/31/2019 CLINICAL DATA:  Stroke follow-up. Increasing confusion. Recently diagnosed with COVID-19 infection. EXAM: MRI HEAD WITHOUT CONTRAST TECHNIQUE: Multiplanar, multiecho pulse sequences of the brain and surrounding structures were obtained without intravenous contrast. COMPARISON:  Head CT 07/31/2019 FINDINGS: Brain: Small acute infarcts are present in the left paracentral pons, subcortical white matter of the superior left occipital lobe, and subcortical white matter or cortex of the anteromedial right frontal lobe. No intracranial hemorrhage, mass, midline shift, or extra-axial fluid collection is identified. There is a chronic, moderate-sized left frontal infarct with involvement of the operculum. Additional patchy and confluent T2 hyperintensities elsewhere in the cerebral white matter bilaterally are nonspecific but compatible with severe chronic small vessel ischemic disease. There are small chronic infarcts throughout the cerebral white matter and deep gray nuclei bilaterally as well as in the right pons. There is  moderate cerebral atrophy. Vascular: Major intracranial vascular flow voids are preserved. Skull and upper cervical spine: Unremarkable bone marrow signal. Sinuses/Orbits: Left cataract extraction. 8 mm cystic focus lateral to the left globe which may be within the inferior aspect of the lacrimal gland. Mild right frontal and ethmoid sinus mucosal thickening. Clear mastoid air cells. Other: 4.6 cm right occipital scalp lipoma. IMPRESSION: 1. Small acute infarcts in the left pons, left occipital lobe, and left right frontal lobe. 2. 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 Electronically Signed   By: Logan Bores M.D.   On: 07/31/2019 12:18   DG Chest Port 1 View  Result Date: 07/30/2019 CLINICAL DATA:  COVID-19 EXAM: PORTABLE CHEST 1 VIEW COMPARISON:  Radiograph 07/24/2019 FINDINGS: Asymmetric elevation left hemidiaphragm is similar to prior. There are increasing patchy interstitial and airspace opacities throughout both lungs most notably in the left base and periphery of the right lung. A right IJ approach dual lumen dialysis catheter tip terminates at the superior cavoatrial junction. Cardiomediastinal contours are similar to  prior counting for differences in technique and patient rotation. No acute osseous or soft tissue abnormality. Degenerative changes are present in the imaged spine and shoulders. IMPRESSION: 1. Increasing patchy interstitial and airspace opacities throughout both lungs Padda compatible with multifocal pneumonia. 2. Stable asymmetric elevation of the left hemidiaphragm. Electronically Signed   By: Lovena Le M.D.   On: 07/30/2019 19:12   VAS US CAROTID (at [facility] and [facility] only)  Result Date: 08/01/2019 Carotid Arterial Duplex Study Indications:       CVA. Risk Factors:      Hypertension. Other Factors:     COVID 19 positive. Limitations        Today's exam was limited due to the patient's respiratory                    variation. Comparison  Study:  No prior studies. Performing Technologist: Oliver Hum RVT  Examination Guidelines: A complete evaluation includes B-mode imaging, spectral Doppler, color Doppler, and power Doppler as needed of all accessible portions of each vessel. Bilateral testing is considered an integral part of a complete examination. Limited examinations for reoccurring indications may be performed as noted.  Right Carotid Findings: +----------+--------+--------+--------+---------------------+------------------+           PSV cm/sEDV cm/sStenosisPlaque Description   Comments           +----------+--------+--------+--------+---------------------+------------------+ CCA Prox  89                      smooth and           Abnormal waveforms                                   heterogenous                            +----------+--------+--------+--------+---------------------+------------------+ CCA Distal59      6               smooth, heterogenous                                                      and calcific                            +----------+--------+--------+--------+---------------------+------------------+ ICA Prox  48      11              smooth, heterogenous                                                      and calcific                            +----------+--------+--------+--------+---------------------+------------------+ ICA Distal69      17                                   tortuous           +----------+--------+--------+--------+---------------------+------------------+  ECA       68      3                                                       +----------+--------+--------+--------+---------------------+------------------+ +----------+--------+-------+------------+-------------------+           PSV cm/sEDV cmsDescribe    Arm Pressure (mmHG) +----------+--------+-------+------------+-------------------+ Subclavian               Not  assessed                    +----------+--------+-------+------------+-------------------+ +---------+--------+--+--------+-+---------+ VertebralPSV cm/s49EDV cm/s8Antegrade +---------+--------+--+--------+-+---------+  Left Carotid Findings: +----------+--------+--------+--------+-----------------------+--------+           PSV cm/sEDV cm/sStenosisPlaque Description     Comments +----------+--------+--------+--------+-----------------------+--------+ CCA Prox  94      9               smooth and heterogenous         +----------+--------+--------+--------+-----------------------+--------+ CCA Distal63      6               smooth and heterogenous         +----------+--------+--------+--------+-----------------------+--------+ ICA Prox  52      11              smooth and heterogenous         +----------+--------+--------+--------+-----------------------+--------+ ICA Distal69      13                                     tortuous +----------+--------+--------+--------+-----------------------+--------+ ECA       66                                                      +----------+--------+--------+--------+-----------------------+--------+ +----------+--------+--------+--------+-------------------+           PSV cm/sEDV cm/sDescribeArm Pressure (mmHG) +----------+--------+--------+--------+-------------------+ [id number]                                          +----------+--------+--------+--------+-------------------+ +---------+--------+--+--------+-+---------+ VertebralPSV cm/s58EDV cm/s9Antegrade +---------+--------+--+--------+-+---------+  Summary: Right Carotid: Velocities in the right ICA are consistent with a 1-39% stenosis.                Abnormal proximal CCA waveforms. Left Carotid: Velocities in the left ICA are consistent with a 1-39% stenosis. Vertebrals: Bilateral vertebral arteries demonstrate antegrade flow. *See table(s) above for  measurements and observations.     Preliminary    Medications: . sodium chloride    . sodium chloride    . heparin 1,100 Units/hr (08/01/19 0951)  . remdesivir 100 mg in NS 100 mL 100 mg (08/01/19 0908)   .  stroke: mapping our early stages of recovery book   Does not apply Once  . allopurinol  100 mg Oral Daily  . aspirin  81 mg Oral Daily  . atorvastatin  40 mg Oral Daily  . calcium acetate  1,334 mg Oral TID WC  . Chlorhexidine Gluconate Cloth  6 each Topical Q0600  . [  START ON 08/07/2019] darbepoetin (ARANESP) injection - DIALYSIS  100 mcg Intravenous Q Wed-HD  . dexamethasone (DECADRON) injection  6 mg Intravenous Q24H  . insulin aspart  0-6 Units Subcutaneous Q4H  . metoCLOPramide  5 mg Oral TID WC  . metoprolol succinate  50 mg Oral Daily  . sevelamer carbonate  2.4 g Oral TID WC

## 2019-08-02 DIAGNOSIS — I639 Cerebral infarction, unspecified: Secondary | ICD-10-CM | POA: Insufficient documentation

## 2019-08-02 DIAGNOSIS — J9600 Acute respiratory failure, unspecified whether with hypoxia or hypercapnia: Secondary | ICD-10-CM

## 2019-08-02 DIAGNOSIS — I6330 Cerebral infarction due to thrombosis of unspecified cerebral artery: Secondary | ICD-10-CM | POA: Insufficient documentation

## 2019-08-02 DIAGNOSIS — I6340 Cerebral infarction due to embolism of unspecified cerebral artery: Secondary | ICD-10-CM | POA: Insufficient documentation

## 2019-08-02 LAB — C-REACTIVE PROTEIN: CRP: 7.8 mg/dL — ABNORMAL HIGH (ref <1.0)

## 2019-08-02 LAB — GLUCOSE, CAPILLARY
Glucose-Capillary: 143 mg/dL — ABNORMAL HIGH (ref 70–99)
Glucose-Capillary: 159 mg/dL — ABNORMAL HIGH (ref 70–99)
Glucose-Capillary: 176 mg/dL — ABNORMAL HIGH (ref 70–99)
Glucose-Capillary: 227 mg/dL — ABNORMAL HIGH (ref 70–99)
Glucose-Capillary: 250 mg/dL — ABNORMAL HIGH (ref 70–99)
Glucose-Capillary: 268 mg/dL — ABNORMAL HIGH (ref 70–99)

## 2019-08-02 LAB — FERRITIN: Ferritin: 4104 ng/mL — ABNORMAL HIGH (ref 24–336)

## 2019-08-02 LAB — MRSA PCR SCREENING: MRSA by PCR: NEGATIVE

## 2019-08-02 LAB — COMPREHENSIVE METABOLIC PANEL
ALT: 20 U/L (ref 0–44)
AST: 20 U/L (ref 15–41)
Albumin: 2.6 g/dL — ABNORMAL LOW (ref 3.5–5.0)
Alkaline Phosphatase: 42 U/L (ref 38–126)
Anion gap: 20 — ABNORMAL HIGH (ref 5–15)
BUN: 52 mg/dL — ABNORMAL HIGH (ref 8–23)
CO2: 25 mmol/L (ref 22–32)
Calcium: 7.5 mg/dL — ABNORMAL LOW (ref 8.9–10.3)
Chloride: 93 mmol/L — ABNORMAL LOW (ref 98–111)
Creatinine, Ser: 7.07 mg/dL — ABNORMAL HIGH (ref 0.61–1.24)
GFR calc Af Amer: 9 mL/min — ABNORMAL LOW (ref >60)
GFR calc non Af Amer: 7 mL/min — ABNORMAL LOW (ref >60)
Glucose, Bld: 170 mg/dL — ABNORMAL HIGH (ref 70–99)
Potassium: 3.9 mmol/L (ref 3.5–5.1)
Sodium: 138 mmol/L (ref 135–145)
Total Bilirubin: 0.7 mg/dL (ref 0.3–1.2)
Total Protein: 6.4 g/dL — ABNORMAL LOW (ref 6.5–8.1)

## 2019-08-02 LAB — APTT
aPTT: 82 seconds — ABNORMAL HIGH (ref 24–36)
aPTT: 107 seconds — ABNORMAL HIGH (ref 24–36)
aPTT: 105 seconds — ABNORMAL HIGH (ref 24–36)

## 2019-08-02 LAB — HEPARIN LEVEL (UNFRACTIONATED)
Heparin Unfractionated: 1.94 IU/mL — ABNORMAL HIGH (ref 0.30–0.70)
Heparin Unfractionated: 1.64 IU/mL — ABNORMAL HIGH (ref 0.30–0.70)

## 2019-08-02 LAB — CBC
HCT: 27.8 % — ABNORMAL LOW (ref 39.0–52.0)
Hemoglobin: 9.7 g/dL — ABNORMAL LOW (ref 13.0–17.0)
MCH: 34.5 pg — ABNORMAL HIGH (ref 26.0–34.0)
MCHC: 34.9 g/dL (ref 30.0–36.0)
MCV: 98.9 fL (ref 80.0–100.0)
Platelets: 315 10*3/uL (ref 150–400)
RBC: 2.81 MIL/uL — ABNORMAL LOW (ref 4.22–5.81)
RDW: 14 % (ref 11.5–15.5)
WBC: 9 10*3/uL (ref 4.0–10.5)
nRBC: 0.3 % — ABNORMAL HIGH (ref 0.0–0.2)

## 2019-08-02 LAB — PROTIME-INR
INR: 1.5 — ABNORMAL HIGH (ref 0.8–1.2)
Prothrombin Time: 18.2 seconds — ABNORMAL HIGH (ref 11.4–15.2)

## 2019-08-02 LAB — D-DIMER, QUANTITATIVE: D-Dimer, Quant: 0.79 ug/mL-FEU — ABNORMAL HIGH (ref 0.00–0.50)

## 2019-08-02 MED ORDER — WARFARIN SODIUM 5 MG PO TABS
5.0000 mg | ORAL_TABLET | Freq: Once | ORAL | Status: AC
  Administered 2019-08-02: 5 mg via ORAL
  Filled 2019-08-02: qty 1

## 2019-08-02 MED ORDER — APIXABAN 5 MG PO TABS
5.0000 mg | ORAL_TABLET | Freq: Two times a day (BID) | ORAL | Status: DC
  Filled 2019-08-02: qty 1

## 2019-08-02 MED ORDER — WARFARIN - PHARMACIST DOSING INPATIENT: Freq: Every day | Status: DC

## 2019-08-02 MED ORDER — HEPARIN (PORCINE) 25000 UT/250ML-% IV SOLN
850.0000 [IU]/h | INTRAVENOUS | Status: DC
  Administered 2019-08-02: 850 [IU]/h via INTRAVENOUS
  Administered 2019-08-03: 700 [IU]/h via INTRAVENOUS
  Administered 2019-08-05 – 2019-08-07 (×2): 850 [IU]/h via INTRAVENOUS
  Filled 2019-08-02 (×3): qty 250

## 2019-08-02 MED ORDER — WARFARIN SODIUM 7.5 MG PO TABS: 7.5000 mg | ORAL_TABLET | Freq: Once | ORAL | Status: DC

## 2019-08-02 NOTE — Progress Notes (Signed)
PROGRESS NOTE    Jonathan Petrosyan Sr.  [id number] DOB: 01/03/1951 DOA: 07/30/2019 PCP: Marton Redwood, MD    Brief Narrative:  68 year old male who presented with difficulty swallowing. He does have significant past medical history for end-stage renal disease on hemodialysis (T, TH, Sat),history of renal transplant, history of stroke, hypertension, type 2 diabetes mellitus,andleft ventricle thrombus. His wife noticed him having difficulty swallowing for about 2 weeks. He tested positive for COVID-19December 16.For the last 4 days patient has been noted to have dyspnea, lethargy and confusion. Patient missed his dialysis session on the day of admission. On his initial physical examination his heart rate was 102, respiratory rate 28, oxygen saturation 80%, his lungs had no rales or rhonchi, heart S1-S2 present andrhythmic, abdomen soft, no lower extremity edema, patient was nonfocal. Sodium 137, potassium 3.7, chloride 93, bicarb 28, glucose 144, BUN 29, creatinine 7.2, white count 6.8, hemoglobin 9.6, hematocrit 29.3, platelets 251.Head CT with small left pontine infarct, age indeterminate. No intracranial hemorrhage. Old left frontal lobe infarct superimposed on chronic ischemic microangiopathy. Chest radiograph with right upper lobe, right lower lobe and left lower lobe interstitial infiltrate. EKG 81 bpm, normal axis, QTC 526, sinus rhythm, septal Q waves, no ST segment changes, negative T lead II,III, aVF and through the precordium.Low voltage  Patient was admitted to the hospital with a working diagnosis of acute hypoxic respiratory failure due to SARS COVID-19 viral pneumonia.  Troponin I elevation 167-140 due to acute viral infection, EKG with no acute changes, ruled out ACS.  Further work up with brain MRI showed small acute infarcts in the left pons, left occipital lobe and bilateral frontal lobes. Failed swallow evaluation.   Assessment & Plan:   Principal  Problem:   Acute respiratory failure due to COVID-19 [facility]) Active Problems:   Chronic hepatitis C without hepatic coma ([name])   Diabetes mellitus type 2, uncomplicated ([place])   History of coronary artery disease   Hypertension   ESRD (end stage renal disease) on dialysis ([place])   Acute respiratory failure with hypoxia ([place])   1. Acute hypoxic respiratory failure due to SARS COVID 19 viral pneumonia.  RR: 20  Pulse oxymetry: 94%  Fi02: 2 L/ min per [place].   COVID-19 Labs  Recent Labs    07/30/19 1843 08/01/19 0340 08/02/19 0311  DDIMER 1.12* 0.83* 0.79*  FERRITIN 3,918* 4,004* 4,104*  LDH 457*  --   --   CRP 12.8* 11.6* 7.8*    Lab Results  Component Value Date   SARSCOV2NAA POSITIVE (A) 07/24/2019    Stable inflammatory markers.  Continue medical therapy with Remdesivir #3/5 (AST 20 and ALT 20), systemic corticosteroids with dexamethasone 6 mg IV q 24H. On with antitussive agents.   2. Acute CVA.small acute infarcts in the left pons, left occipital lobe and bilateral frontal lobes. Multiple chronic infarcts. Possible embolic phenomena. Patient now allowed to eat per speech therapy recommendations, dysphagia one diet. LDL 52. Case discussed with Dr Leonie Man from neurology and will resume oral anticoagulation with warfarin.  Echocardiogram with LV EF 30 to 35%, severe apical dyskinesis, apical left ventricular aneurysm.    Will need SNF at discharge.   Continue with aspirin and statin.   3. HTN. Blood pressure systolic 94 to 123456 mmHg, continue with metoprolol 50 mg XL.  4. Uncontrolled T2DM. Fasting glucose is 170, will continue insulin sliding scale for glucose cover and monitoring.  5. ESRD on HD, anemia of chronic renal disease/ failed renal transplant. On phoslo, renvela  and aransep. Continue HD per nephrology recommendations.     6. LV thrombus. Continue anticoagulation with warfarin, positive severe LV structural disease.    Consultants:    Neurology     Procedures:     Antimicrobials:       Subjective: Patient continue slow to response, strength has been preserved, no nausea or vomiting, his speech has improved and his diet has been advanced with good toleration.   Objective: Vitals:   08/02/19 0335 08/02/19 0445 08/02/19 0446 08/02/19 0748  BP: (!) 118/57 116/62  (!) 112/57  Pulse: 86 78  82  Resp: 20 20  20   Temp: 98.1 F (36.7 C)  (!) 97.5 F (36.4 C) 97.8 F (36.6 C)  TempSrc: Oral  Oral Oral  SpO2:  94%  94%  Weight:      Height:        Intake/Output Summary (Last 24 hours) at 08/02/2019 0934 Last data filed at 08/02/2019 [id number] Gross per 24 hour  Intake 491.55 ml  Output 1581 ml  Net -1089.45 ml   Filed Weights   07/31/19 0700 07/31/19 1903  Weight: 81.6 kg 77.2 kg    Examination:   General: Not in pain or dyspnea, deconditioned  Neurology: Awake and alert, non focal  E ENT: mild pallor, no icterus, oral mucosa moist Cardiovascular: No JVD. S1-S2 present, rhythmic, no gallops, rubs, or murmurs. No lower extremity edema. Pulmonary: positive breath sounds bilaterally. Gastrointestinal. Abdomen with no organomegaly, non tender, no rebound or guarding Skin. No rashes Musculoskeletal: no joint deformities     Data Reviewed: I have personally reviewed following labs and imaging studies  CBC: Recent Labs  Lab 07/30/19 1843 08/01/19 1212 08/02/19 0311  WBC 6.8 7.0 9.0  NEUTROABS 5.6  --   --   HGB 9.6* 10.2* 9.7*  HCT 29.3* 30.9* 27.8*  MCV 103.2* 103.7* 98.9  PLT 251 361 [id number]   Basic Metabolic Panel: Recent Labs  Lab 07/30/19 1843 08/01/19 0340 08/02/19 0311  NA 137 143 138  K 3.7 4.8 3.9  CL 93* 97* 93*  CO2 28 24 25   GLUCOSE 144* 194* 170*  BUN 29* 78* 52*  CREATININE 7.21* 10.30* 7.07*  CALCIUM 7.4* 7.4* 7.5*   GFR: Estimated Creatinine Clearance: 10.9 mL/min (A) (by C-G formula based on SCr of 7.07 mg/dL (H)). Liver Function Tests: Recent Labs  Lab 07/30/19 1843  08/01/19 0340 08/02/19 0311  AST 26 17 20   ALT 20 17 20   ALKPHOS 43 39 42  BILITOT 0.8 0.4 0.7  PROT 6.8 6.2* 6.4*  ALBUMIN 2.7* 2.4* 2.6*   No results for input(s): LIPASE, AMYLASE in the last 168 hours. No results for input(s): AMMONIA in the last 168 hours. Coagulation Profile: Recent Labs  Lab 07/31/19 0634  INR 1.4*   Cardiac Enzymes: No results for input(s): CKTOTAL, CKMB, CKMBINDEX, TROPONINI in the last 168 hours. BNP (last 3 results) No results for input(s): PROBNP in the last 8760 hours. HbA1C: Recent Labs    07/31/19 0559  HGBA1C 6.3*   CBG: Recent Labs  Lab 08/01/19 1524 08/01/19 2134 08/02/19 0006 08/02/19 0506 08/02/19 0802  GLUCAP 184* 134* 159* 143* 176*   Lipid Profile: Recent Labs    07/31/19 0559 08/01/19 0340  CHOL 91 90  HDL 31* 31*  LDLCALC 39 49  TRIG 107 52  CHOLHDL 2.9 2.9   Thyroid Function Tests: No results for input(s): TSH, T4TOTAL, FREET4, T3FREE, THYROIDAB in the last 72 hours. Anemia Panel: Recent Labs  08/01/19 0340 08/02/19 0311  FERRITIN 4,004* 4,104*      Radiology Studies: I have reviewed all of the imaging during this hospital visit personally     Scheduled Meds: .  stroke: mapping our early stages of recovery book   Does not apply Once  . allopurinol  100 mg Oral Daily  . aspirin  81 mg Oral Daily  . atorvastatin  40 mg Oral Daily  . calcium acetate  1,334 mg Oral TID WC  . Chlorhexidine Gluconate Cloth  6 each Topical Q0600  . [START ON 08/07/2019] darbepoetin (ARANESP) injection - DIALYSIS  100 mcg Intravenous Q Wed-HD  . dexamethasone (DECADRON) injection  6 mg Intravenous Q24H  . insulin aspart  0-6 Units Subcutaneous Q4H  . metoCLOPramide  5 mg Oral TID WC  . metoprolol succinate  50 mg Oral Daily  . sevelamer carbonate  2.4 g Oral TID WC   Continuous Infusions: . sodium chloride    . sodium chloride    . heparin 850 Units/hr (08/02/19 0928)  . remdesivir 100 mg in NS 100 mL 100 mg  (08/02/19 0852)     LOS: 3 days        Dontee Jaso Gerome Apley, MD

## 2019-08-02 NOTE — Evaluation (Addendum)
Speech Language Pathology Evaluation Patient Details Name: Jonathan Castiglioni Sr. MRN: [id number] DOB: 1950/10/07 Today's Date: 08/02/2019 Time: [id number] SLP Time Calculation (min) (ACUTE ONLY): 15 min  Problem List:  Patient Active Problem List   Diagnosis Date Noted  . Cerebral thrombosis with cerebral infarction 08/02/2019  . Cerebral embolism with cerebral infarction 08/02/2019  . Acute respiratory failure due to COVID-19 ([place]) 07/30/2019  . Acute respiratory failure with hypoxia ([place]) 07/30/2019  . Chronic systolic HF (heart failure) ([place]) 02/25/2019  . ESRD (end stage renal disease) on dialysis ([place]) 02/25/2019  . Generalized abdominal pain 07/16/2018  . Arteriovenous fistula for hemodialysis in place, primary ([place]) 05/15/2018  . 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 Acute blood loss anemia 12/14/2017  . Hypomagnesemia 12/03/2017  . Anticoagulated 12/02/2017  . Stroke ([place]) 11/29/2017  . 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 History of MI (myocardial infarction) 11/28/2017  . Hypertension 11/28/2017  . Chronic hepatitis C without hepatic coma ([place]) 09/25/2017  . Diabetes mellitus type 2, uncomplicated ([place]) [id number]  . Myocardial infarction ([place]) 09/25/2017  . Renal transplant recipient 09/25/2017  . Transfusion history 09/25/2017  . History of coronary artery disease 09/12/2016  . 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 MI ([place]) 09/12/2016  . Aftercare following organ transplant 09/11/2016  . 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 At risk for infection transmitted from donor 06/16/2014  . Ureteral stent displacement ([place]) 06/10/2014  . Prophylactic antibiotic 06/09/2014  . Immunosuppression ([place]) 06/06/2014   Past Medical History:  Past Medical History:  Diagnosis Date  . Diabetes mellitus without complication ([place])   . Hypertension   . Renal disorder 2015   right kidney transplant  . Stroke [facility])     Past Surgical History:  Past Surgical History:  Procedure Laterality Date  . BACK SURGERY     Has had 2 back surgeries  . Depression    . HAND SURGERY Right   . HAND SURGERY    . KIDNEY TRANSPLANT     November 2015  . Memory loss    . NEPHRECTOMY TRANSPLANTED ORGAN     HPI:  Jonathan Orio Sr. is a 68 y.o. male with history of ESRD on hemodialysis, LV thrombus, renal transplant, stroke, hypertension diabetes mellitus was having increasing difficulty swallowing over the last 2 weeks as noticed by patient's wife (pocketing his food. Per chart 6 days ago diagnosed with Covid at the time he was not symptomatic. Now more short of breath and lethargic and confused. 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 Chest x-ray showing multifocal pneumonia.   Assessment / Plan / Recommendation Clinical Impression  Pt presents with broad cognitive-linguistic deficits covering basic cognitive and communicative functions.  He demonstrates poor sustained attention with impersistance and cues needed to move through tasks.  Responses tend to be perseverative.  Inattention leads to inability to recall new auditiory information. Pt with poor awareness of deficits, unable to state why he is in hospital and is disoriented to elements of time and place.  He does not recognize nor attempt to correct inaccurate biographical information (DOB, age, address).  Output is fluent, mildly dysarthric.  Verbal reasoning is significantly impaired.  Pt will benefit from SLP intervention whil in acute care; agree with recs for SNF rehab upon D/C.     In addition, pt offered sips of nectar  and thin liquids during session.  He continued to cough with thin liquids, concerning for aspiration.  Nectar thick liquids did not elicit coughing.  Continue on current diet at this time.  SLP will follow and  begin swallowing remediation this upcoming week.     SLP Assessment  SLP Recommendation/Assessment: Patient needs continued Speech Lanaguage Pathology Services SLP Visit Diagnosis: Cognitive communication deficit (R41.841)    Follow Up Recommendations  Skilled Nursing facility    Frequency and Duration min 2x/week  2 weeks      SLP Evaluation Cognition  Overall Cognitive Status: Impaired/Different from baseline Arousal/Alertness: Awake/alert Orientation Level: Oriented to person;Disoriented to place;Disoriented to time;Disoriented to situation Attention: Focused;Sustained Focused Attention: Appears intact Sustained Attention: Impaired Sustained Attention Impairment: Verbal basic Memory: Impaired Memory Impairment: Storage deficit Awareness: Impaired Awareness Impairment: Intellectual impairment Problem Solving: Impaired Problem Solving Impairment: Verbal basic;Functional basic Executive Function: Initiating;Sequencing Sequencing: Impaired Initiating: Impaired Initiating Impairment: Verbal basic Behaviors: Perseveration Safety/Judgment: Impaired       Comprehension  Auditory Comprehension Overall Auditory Comprehension: Impaired Yes/No Questions: Impaired Basic Biographical Questions: 51-75% accurate Commands: Impaired Conversation: Simple Reading Comprehension Reading Status: Not tested    Expression Expression Primary Mode of Expression: Verbal Verbal Expression Overall Verbal Expression: Appears within functional limits for tasks assessed   Oral / Motor  Oral Motor/Sensory Function Overall Oral Motor/Sensory Function: Within functional limits Motor Speech Overall Motor Speech: Impaired Respiration: Within functional limits Phonation: Normal Resonance: Within functional limits Articulation: Impaired Level of Impairment: Sentence Intelligibility: Intelligibility reduced Sentence: 75-100% accurate Conversation: 75-100% accurate Motor Planning: Witnin  functional limits Motor Speech Errors: Not applicable   GO                    [name] 08/02/2019, 3:15 PM  Anistyn Graddy L. Tivis Ringer, [place] Office number [phone]

## 2019-08-02 NOTE — Progress Notes (Signed)
Patient ID: Jonathan Ryer Sr., male   DOB: August 22, 1950, 68 y.o.   MRN: [id number]   [facility] Progress Note   Assessment/ Plan:   1.  COVID-19 pneumonia; on remdesivir and Decadron-day 3 of 4 with clinical improvement of respiratory symptoms based on patient report and oxygenation requirements. 2.  Left ventricular thrombus with embolic strokes: On anticoagulation with intravenous heparin and plans noted for vascular imaging as recommended by neurology. 3. ESRD: Usually on a Tuesday/Thursday/Saturday dialysis schedule with hemodialysis done yesterday.  Next dialysis due Sunday. 4. Anemia: Without overt blood loss, continue to monitor on ESA. 5. CKD-MBD: Calcium level marginally low when corrected for low albumin, follow on dysphagia 1 diet. 6. Nutrition: With recent eating/feeding difficulties likely arising from occult embolic CVAs.  Continue dysphagia evaluation with ONS. 7. Hypertension: Blood pressures acceptable control, euvolemic on exam.  Subjective:   He denies any complaints specifically denies any chest pain or shortness of breath.  Not very verbally engaging.   Objective:   BP 113/61   Pulse 80   Temp 97.8 F (36.6 C) (Oral)   Resp 19   Ht 5\' 11"  (1.803 m)   Wt 77.2 kg   SpO2 97%   BMI 23.74 kg/m   Physical Exam: Gen: Comfortably resting in bed, listening to television.  On heparin drip. CVS: Pulse regular rhythm, normal rate, S1 and S2 normal Resp: Anteriorly clear to auscultation, no rales/rhonchi Abd: Soft, flat, nontender Ext: No lower extremity edema, right upper arm arteriovenous fistula with palpable thrill  Labs: BMET Recent Labs  Lab 07/30/19 1843 08/01/19 0340 08/02/19 0311  NA 137 143 138  K 3.7 4.8 3.9  CL 93* 97* 93*  CO2 28 24 25   GLUCOSE 144* 194* 170*  BUN 29* 78* 52*  CREATININE 7.21* 10.30* 7.07*  CALCIUM 7.4* 7.4* 7.5*   CBC Recent Labs  Lab 07/30/19 1843 08/01/19 1212 08/02/19 0311  WBC 6.8 7.0 9.0  NEUTROABS 5.6   --   --   HGB 9.6* 10.2* 9.7*  HCT 29.3* 30.9* 27.8*  MCV 103.2* 103.7* 98.9  PLT 251 361 315     Medications:    .  stroke: mapping our early stages of recovery book   Does not apply Once  . allopurinol  100 mg Oral Daily  . aspirin  81 mg Oral Daily  . atorvastatin  40 mg Oral Daily  . calcium acetate  1,334 mg Oral TID WC  . Chlorhexidine Gluconate Cloth  6 each Topical Q0600  . [START ON 08/07/2019] darbepoetin (ARANESP) injection - DIALYSIS  100 mcg Intravenous Q Wed-HD  . dexamethasone (DECADRON) injection  6 mg Intravenous Q24H  . insulin aspart  0-6 Units Subcutaneous Q4H  . metoCLOPramide  5 mg Oral TID WC  . metoprolol succinate  50 mg Oral Daily  . sevelamer carbonate  2.4 g Oral TID WC  . warfarin  7.5 mg Oral ONCE-1800  . Warfarin - Pharmacist Dosing Inpatient   Does not apply [id number]   Elmarie Shiley, MD 08/02/2019, 11:42 AM

## 2019-08-02 NOTE — TOC Initial Note (Signed)
Transition of Care (TOC) - Initial/Assessment Note    Patient Details  Name: Jonathan Ezernack Sr. MRN: [id number] Date of Birth: 05/12/1951  Transition of Care [facility]) CM/SW Contact:    Maryclare Labrador, RN Phone Number: 08/02/2019, 4:23 PM  Clinical Narrative:       CM unable to reach pt via phone.  CM was able to reach pts wife via phone. Per wife pt was already transitioned to [facility].   Pts wife in agreement for pt to discharge to SNF.  CM explained SNF process to wife.  Pt will need to be worked up for SNF.         Expected Discharge Plan: [place]     Patient Goals and CMS Choice     Choice offered to / list presented to : Spouse  Expected Discharge Plan and Services Expected Discharge Plan: [place] arrangements for the past 2 months: Single Family Home                                      Prior Living Arrangements/Services Living arrangements for the past 2 months: Single Family Home            Need for Family Participation in Patient Care: Yes (Comment) Care giver support system in place?: Yes (comment)   Criminal Activity/Legal Involvement Pertinent to Current Situation/Hospitalization: No - Comment as needed  Activities of Daily Living      Permission Sought/Granted                  Emotional Assessment              Admission diagnosis:  ESRD (end stage renal disease) on dialysis ([employer]) [N18.6, Z99.2] Acute respiratory failure with hypoxia ([name]) [J96.01] Acute encephalopathy [G93.40] Severe comorbid illness [R69] Acute respiratory failure due to COVID-19 ([place]) [U07.1, J96.00] Pneumonia due to COVID-19 virus [U07.1, J12.89] Patient Active Problem List   Diagnosis Date Noted  . Cerebral thrombosis with cerebral infarction 08/02/2019  . Cerebral embolism with cerebral infarction 08/02/2019  . Acute respiratory failure due to COVID-19 ([place]) 07/30/2019  . Acute respiratory  failure with hypoxia ([place]) 07/30/2019  . Chronic systolic HF (heart failure) ([place]) 02/25/2019  . ESRD (end stage renal disease) on dialysis ([place]) 02/25/2019  . Generalized abdominal pain 07/16/2018  . Arteriovenous fistula for hemodialysis in place, primary ([place]) 05/15/2018  . 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 Acute blood loss anemia 12/14/2017  . Hypomagnesemia 12/03/2017  . Anticoagulated 12/02/2017  . Stroke ([place]) 11/29/2017  . 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 History of MI (myocardial infarction) 11/28/2017  . Hypertension 11/28/2017  . Chronic hepatitis C without hepatic coma ([place]) 09/25/2017  . Diabetes mellitus type 2, uncomplicated ([place]) [id number]  . Myocardial infarction ([place]) 09/25/2017  . Renal transplant recipient 09/25/2017  . Transfusion history 09/25/2017  . History of coronary artery disease 09/12/2016  . 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 MI ([place]) 09/12/2016  . Aftercare following organ transplant 09/11/2016  . 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 At risk for infection transmitted from donor 06/16/2014  . Ureteral stent displacement ([place]) 06/10/2014  . Prophylactic antibiotic 06/09/2014  . Immunosuppression ([place]) 06/06/2014   PCP:  Marton Redwood, MD Pharmacy:   [facility], [place] -  [address] [place] [address] Phone: [phone] Fax: [phone]     Social Determinants of Health (SDOH) Interventions    Readmission Risk Interventions No flowsheet data found.

## 2019-08-02 NOTE — Progress Notes (Signed)
Patient had a 12 beat run of ventricular tachycardia.  Patient was sleeping and asymptomatic.  Vital signs taken and then Dr. Baltazar Najjar was notified.  Will continue to monitor patient closely.

## 2019-08-02 NOTE — Progress Notes (Signed)
Writer helped patient to talk with his wife over the phone. Patient's wife was updated on patient's condition. All questions were answered. Will continue to monitor.

## 2019-08-02 NOTE — Progress Notes (Signed)
Heparin drip rate was decreased to 7.5 ml/hr per pharmacy. Will continue to monitor.

## 2019-08-02 NOTE — Progress Notes (Signed)
STROKE TEAM PROGRESS NOTE   INTERVAL HISTORY I have personally reviewed history of presenting illness, electronic medical records and imaging films in PACS.  He presented with slurred speech and left facial weakness.  MRI scan shows multiple acute infarcts involving the left pons, left occipital and right frontal white matter.  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  He is also been found to be having Covid infection  Vitals:   08/02/19 0446 08/02/19 0748 08/02/19 0800 08/02/19 1200  BP:  (!) 112/57 113/61 (!) 94/59  Pulse:  82 80 76  Resp:  20 19 20   Temp: (!) 97.5 F (36.4 C) 97.8 F (36.6 C)  97.7 F (36.5 C)  TempSrc: Oral Oral  Axillary  SpO2:  94% 97% 95%  Weight:      Height:        CBC:  Recent Labs  Lab 07/30/19 1843 08/01/19 1212 08/02/19 0311  WBC 6.8 7.0 9.0  NEUTROABS 5.6  --   --   HGB 9.6* 10.2* 9.7*  HCT 29.3* 30.9* 27.8*  MCV 103.2* 103.7* 98.9  PLT 251 361 [id number]    Basic Metabolic Panel:  Recent Labs  Lab 08/01/19 0340 08/02/19 0311  NA 143 138  K 4.8 3.9  CL 97* 93*  CO2 24 25  GLUCOSE 194* 170*  BUN 78* 52*  CREATININE 10.30* 7.07*  CALCIUM 7.4* 7.5*   Lipid Panel:     Component Value Date/Time   CHOL 90 08/01/2019 0340   TRIG 52 08/01/2019 0340   HDL 31 (L) 08/01/2019 0340   CHOLHDL 2.9 08/01/2019 0340   VLDL 10 08/01/2019 0340   LDLCALC 49 08/01/2019 0340   HgbA1c:  Lab Results  Component Value Date   HGBA1C 6.3 (H) 07/31/2019    IMAGING CT ANGIO HEAD W OR WO CONTRAST  Result Date: 08/01/2019 CLINICAL DATA:  Launch template CTA head neck EXAM: CT ANGIOGRAPHY HEAD AND NECK TECHNIQUE: Multidetector CT imaging of the head and neck was performed using the standard protocol during bolus administration of intravenous contrast. Multiplanar CT image reconstructions and MIPs were obtained to evaluate the vascular anatomy. Carotid stenosis measurements (when applicable) are obtained utilizing NASCET criteria,  using the distal internal carotid diameter as the denominator. CONTRAST:  57mL OMNIPAQUE IOHEXOL 350 MG/ML SOLN COMPARISON:  Brain MRI 07/31/2019 FINDINGS: CTA NECK FINDINGS SKELETON: Moderate C4-5 degenerative disc disease with mild spinal canal stenosis. No lytic or blastic lesion. OTHER NECK: Normal pharynx, larynx and major salivary glands. No cervical lymphadenopathy. Unremarkable thyroid gland. Large right parieto-occipital scalp lipoma. UPPER CHEST: Biapical ground-glass opacities. AORTIC ARCH: There is mild calcific atherosclerosis of the aortic arch. There is no aneurysm, dissection or hemodynamically significant stenosis of the visualized portion of the aorta. Conventional 3 vessel aortic branching pattern. The visualized proximal subclavian arteries are widely patent. RIGHT CAROTID SYSTEM: No dissection, occlusion or aneurysm. Mild atherosclerotic calcification at the carotid bifurcation without hemodynamically significant stenosis. LEFT CAROTID SYSTEM: There is a short segment dissection within the proximal left common carotid artery (series 7, image 133). The remainder of the left common carotid artery is normal. The left internal carotid artery is normal. VERTEBRAL ARTERIES: Codominant configuration. Atherosclerotic calcification of both origins. There is no dissection, occlusion or flow-limiting stenosis to the skull base (V1-V3 segments). CTA HEAD FINDINGS POSTERIOR CIRCULATION: --Vertebral arteries: Normal V4 segments. --Posterior inferior cerebellar arteries (PICA): Patent origins from the vertebral arteries. --Anterior inferior cerebellar arteries (AICA): Patent origins from the basilar artery. --  Basilar artery: Normal. --Superior cerebellar arteries: Normal. --Posterior cerebral arteries: Normal. Both originate from the basilar artery. Posterior communicating arteries (p-comm) are diminutive or absent. ANTERIOR CIRCULATION: --Intracranial internal carotid arteries: Normal. --Anterior cerebral  arteries (ACA): Normal. Both A1 segments are present. Patent anterior communicating artery (a-comm). --Middle cerebral arteries (MCA): Normal. VENOUS SINUSES: As permitted by contrast timing, patent. ANATOMIC VARIANTS: None Review of the MIP images confirms the above findings. IMPRESSION: 1. 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 This could serve as an embolic source. 2. No intracranial arterial occlusion or high-grade stenosis. 3. Biapical ground-glass opacities in both lungs. This could indicate pulmonary edema or infection. Correlate with chest radiography 4. Aortic Atherosclerosis (ICD10-I70.0). Electronically Signed   By: Ulyses Jarred M.D.   On: 08/01/2019 23:32   CT ANGIO NECK W OR WO CONTRAST  Result Date: 08/01/2019 CLINICAL DATA:  Launch template CTA head neck EXAM: CT ANGIOGRAPHY HEAD AND NECK TECHNIQUE: Multidetector CT imaging of the head and neck was performed using the standard protocol during bolus administration of intravenous contrast. Multiplanar CT image reconstructions and MIPs were obtained to evaluate the vascular anatomy. Carotid stenosis measurements (when applicable) are obtained utilizing NASCET criteria, using the distal internal carotid diameter as the denominator. CONTRAST:  71mL OMNIPAQUE IOHEXOL 350 MG/ML SOLN COMPARISON:  Brain MRI 07/31/2019 FINDINGS: CTA NECK FINDINGS SKELETON: Moderate C4-5 degenerative disc disease with mild spinal canal stenosis. No lytic or blastic lesion. OTHER NECK: Normal pharynx, larynx and major salivary glands. No cervical lymphadenopathy. Unremarkable thyroid gland. Large right parieto-occipital scalp lipoma. UPPER CHEST: Biapical ground-glass opacities. AORTIC ARCH: There is mild calcific atherosclerosis of the aortic arch. There is no aneurysm, dissection or hemodynamically significant stenosis of the visualized portion of the aorta. Conventional 3 vessel aortic branching pattern. The visualized proximal subclavian  arteries are widely patent. RIGHT CAROTID SYSTEM: No dissection, occlusion or aneurysm. Mild atherosclerotic calcification at the carotid bifurcation without hemodynamically significant stenosis. LEFT CAROTID SYSTEM: There is a short segment dissection within the proximal left common carotid artery (series 7, image 133). The remainder of the left common carotid artery is normal. The left internal carotid artery is normal. VERTEBRAL ARTERIES: Codominant configuration. Atherosclerotic calcification of both origins. There is no dissection, occlusion or flow-limiting stenosis to the skull base (V1-V3 segments). CTA HEAD FINDINGS POSTERIOR CIRCULATION: --Vertebral arteries: Normal V4 segments. --Posterior inferior cerebellar arteries (PICA): Patent origins from the vertebral arteries. --Anterior inferior cerebellar arteries (AICA): Patent origins from the basilar artery. --Basilar artery: Normal. --Superior cerebellar arteries: Normal. --Posterior cerebral arteries: Normal. Both originate from the basilar artery. Posterior communicating arteries (p-comm) are diminutive or absent. ANTERIOR CIRCULATION: --Intracranial internal carotid arteries: Normal. --Anterior cerebral arteries (ACA): Normal. Both A1 segments are present. Patent anterior communicating artery (a-comm). --Middle cerebral arteries (MCA): Normal. VENOUS SINUSES: As permitted by contrast timing, patent. ANATOMIC VARIANTS: None Review of the MIP images confirms the above findings. IMPRESSION: 1. 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 This could serve as an embolic source. 2. No intracranial arterial occlusion or high-grade stenosis. 3. Biapical ground-glass opacities in both lungs. This could indicate pulmonary edema or infection. Correlate with chest radiography 4. Aortic Atherosclerosis (ICD10-I70.0). Electronically Signed   By: Ulyses Jarred M.D.   On: 08/01/2019 23:32   ECHOCARDIOGRAM COMPLETE  Result Date: 08/01/2019    ECHOCARDIOGRAM REPORT   Patient Name:   Greeley Garciaperez [name]. Date of Exam: 08/01/2019 Medical Rec #:  [id number]  Height:       71.0 in Accession #:    [id number]       Weight:       170.2 lb Date of Birth:  1951/05/29        BSA:          1.97 m Patient Age:    23 years         BP:           103/73 mmHg Patient Gender: M                HR:           70 bpm. Exam Location:  Inpatient Procedure: 2D Echo, Cardiac Doppler, Color Doppler and Intracardiac            Opacification Agent Indications:    Dyspnea 786.09  History:        Patient has no prior history of Echocardiogram examinations.                 CHF, Previous Myocardial Infarction; Risk Factors:Hypertension,                 Diabetes and Former Smoker. LV thrombus.  Sonographer:    Paulita Fujita RDCS Referring Phys: [id number] [place]  Sonographer Comments: Suboptimal parasternal window. Covid positive. IMPRESSIONS  1. Left ventricular ejection fraction, by visual estimation, is 30 to 35%. The left ventricle has moderate to severely decreased function. There is no left ventricular hypertrophy.  2. Severe dyskinesis of the left ventricular, entire apical segment.  3. Apical left ventricular aneurysm.  4. Definity contrast agent was given IV to delineate the left ventricular endocardial borders.  5. No intracardiac thrombi or masses were visualized.  6. Left ventricular diastolic parameters are consistent with Grade I diastolic dysfunction (impaired relaxation).  7. Global right ventricle has normal systolic function.The right ventricular size is normal. No increase in right ventricular wall thickness.  8. Left atrial size was normal.  9. Right atrial size was normal. 10. The mitral valve is normal in structure. Mild mitral valve regurgitation. No evidence of mitral stenosis. 11. The tricuspid valve is normal in structure. 12. The aortic valve is normal in structure. Aortic valve regurgitation is trivial. No evidence of aortic valve sclerosis or  stenosis. 13. The pulmonic valve was normal in structure. Pulmonic valve regurgitation is not visualized. 14. The inferior vena cava is normal in size with greater than 50% respiratory variability, suggesting right atrial pressure of 3 mmHg. FINDINGS  Left Ventricle: Left ventricular ejection fraction, by visual estimation, is 30 to 35%. The left ventricle has moderate to severely decreased function. Severe dyskinesis of the left ventricular, entire apical segment. Definity contrast agent was given IV to delineate the left ventricular endocardial borders. The left ventricle demonstrates regional wall motion abnormalities. There is no left ventricular hypertrophy. Left ventricular diastolic parameters are consistent with Grade I diastolic dysfunction (impaired relaxation). Normal left atrial pressure. Apical aneurysm can be seen. Right Ventricle: The right ventricular size is normal. No increase in right ventricular wall thickness. Global RV systolic function is has normal systolic function. Left Atrium: Left atrial size was normal in size. Right Atrium: Right atrial size was normal in size Pericardium: There is no evidence of pericardial effusion. Mitral Valve: The mitral valve is normal in structure. Mild mitral valve regurgitation. No evidence of mitral valve stenosis by observation. Tricuspid Valve: The tricuspid valve is normal in structure. Tricuspid valve regurgitation is not demonstrated. Aortic  Valve: The aortic valve is normal in structure. Aortic valve regurgitation is trivial. The aortic valve is structurally normal, with no evidence of sclerosis or stenosis. Pulmonic Valve: The pulmonic valve was normal in structure. Pulmonic valve regurgitation is not visualized. Pulmonic regurgitation is not visualized. Aorta: The aortic root, ascending aorta and aortic arch are all structurally normal, with no evidence of dilitation or obstruction. Venous: The inferior vena cava is normal in size with greater than 50%  respiratory variability, suggesting right atrial pressure of 3 mmHg. IAS/Shunts: No atrial level shunt detected by color flow Doppler. There is no evidence of a patent foramen ovale. No ventricular septal defect is seen or detected. There is no evidence of an atrial septal defect. Additional Comments: No intracardiac thrombi or masses were visualized.   LV Volumes (MOD) LV area d, A2C:    44.00 cm Diastology LV area d, A4C:    43.50 cm LV e' lateral:   5.43 cm/s LV area s, A2C:    34.10 cm LV E/e' lateral: 10.6 LV area s, A4C:    34.50 cm LV e' medial:    4.05 cm/s LV major d, A2C:   9.28 cm   LV E/e' medial:  14.2 LV major d, A4C:   9.21 cm LV major s, A2C:   8.39 cm LV major s, A4C:   8.13 cm LV vol d, MOD A2C: 173.0 ml LV vol d, MOD A4C: 169.0 ml LV vol s, MOD A2C: 113.0 ml LV vol s, MOD A4C: 117.0 ml LV SV MOD A2C:     60.0 ml LV SV MOD A4C:     169.0 ml LV SV MOD BP:      54.4 ml RIGHT VENTRICLE RV S prime:     11.40 cm/s TAPSE (M-mode): 2.0 cm LEFT ATRIUM             Index       RIGHT ATRIUM           Index LA Vol (A2C):   66.6 ml 33.83 ml/m RA Area:     14.30 cm LA Vol (A4C):   55.4 ml 28.14 ml/m RA Volume:   34.50 ml  17.52 ml/m LA Biplane Vol: 62.2 ml 31.59 ml/m  AORTIC VALVE LVOT Vmax:   91.40 cm/s LVOT Vmean:  58.400 cm/s LVOT VTI:    0.221 m MITRAL VALVE MV Area (PHT): 3.21 cm             SHUNTS MV PHT:        68.44 msec           Systemic VTI: 0.22 m MV Decel Time: 236 msec MV E velocity: 57.40 cm/s 103 cm/s MV A velocity: 82.30 cm/s 70.3 cm/s MV E/A ratio:  0.70       1.5  Mihai Croitoru MD Electronically signed by Sanda Klein MD Signature Date/Time: 08/01/2019/2:17:44 PM    Final    VAS US CAROTID (at [facility] and [facility] only)  Result Date: 08/02/2019 Carotid Arterial Duplex Study Indications:       CVA. Risk Factors:      Hypertension. Other Factors:     COVID 19 positive. Limitations        Today's exam was limited due to the patient's respiratory                    variation. Comparison  Study:  No prior studies. Performing Technologist: Oliver Hum RVT  Examination Guidelines: A complete evaluation includes B-mode  imaging, spectral Doppler, color Doppler, and power Doppler as needed of all accessible portions of each vessel. Bilateral testing is considered an integral part of a complete examination. Limited examinations for reoccurring indications may be performed as noted.  Right Carotid Findings: +----------+--------+--------+--------+---------------------+------------------+           PSV cm/sEDV cm/sStenosisPlaque Description   Comments           +----------+--------+--------+--------+---------------------+------------------+ CCA Prox  89                      smooth and           Abnormal waveforms                                   heterogenous                            +----------+--------+--------+--------+---------------------+------------------+ CCA Distal59      6               smooth, heterogenous                                                      and calcific                            +----------+--------+--------+--------+---------------------+------------------+ ICA Prox  48      11              smooth, heterogenous                                                      and calcific                            +----------+--------+--------+--------+---------------------+------------------+ ICA Distal69      17                                   tortuous           +----------+--------+--------+--------+---------------------+------------------+ ECA       68      3                                                       +----------+--------+--------+--------+---------------------+------------------+ +----------+--------+-------+------------+-------------------+           PSV cm/sEDV cmsDescribe    Arm Pressure (mmHG) +----------+--------+-------+------------+-------------------+ Subclavian               Not  assessed                    +----------+--------+-------+------------+-------------------+ +---------+--------+--+--------+-+---------+ VertebralPSV cm/s49EDV cm/s8Antegrade +---------+--------+--+--------+-+---------+  Left Carotid Findings: +----------+--------+--------+--------+-----------------------+--------+           PSV cm/sEDV cm/sStenosisPlaque Description     Comments +----------+--------+--------+--------+-----------------------+--------+  CCA Prox  94      9               smooth and heterogenous         +----------+--------+--------+--------+-----------------------+--------+ CCA Distal63      6               smooth and heterogenous         +----------+--------+--------+--------+-----------------------+--------+ ICA Prox  52      11              smooth and heterogenous         +----------+--------+--------+--------+-----------------------+--------+ ICA Distal69      13                                     tortuous +----------+--------+--------+--------+-----------------------+--------+ ECA       66                                                      +----------+--------+--------+--------+-----------------------+--------+ +----------+--------+--------+--------+-------------------+           PSV cm/sEDV cm/sDescribeArm Pressure (mmHG) +----------+--------+--------+--------+-------------------+ [id number]                                          +----------+--------+--------+--------+-------------------+ +---------+--------+--+--------+-+---------+ VertebralPSV cm/s58EDV cm/s9Antegrade +---------+--------+--+--------+-+---------+  Summary: Right Carotid: Velocities in the right ICA are consistent with a 1-39% stenosis.                Abnormal proximal CCA waveforms. Left Carotid: Velocities in the left ICA are consistent with a 1-39% stenosis. Vertebrals: Bilateral vertebral arteries demonstrate antegrade flow. *See table(s) above for  measurements and observations.  Electronically signed by Antony Contras MD on 08/02/2019 at 11:46:07 AM.    Final     PHYSICAL EXAM Frail middle-aged African-American male sitting up in bed.  Mild respiratory distress. . Afebrile. Head is nontraumatic. Neck is supple without bruit.    Cardiac exam no murmur or gallop. Lungs are clear to auscultation. Distal pulses are well felt. Neurological Exam :  Patient is awake alert is disoriented to time place and person.  He has diminished attention, registration and poor recall.  He has mild dysarthria but can be understood.  He follows commands well.  Extraocular movements are full range though there is slight restriction of left lateral gaze.  Pupils are equal reactive blinks to threat bilaterally.  Mild left lower facial weakness.  Tongue midline.  Motor system exam shows mild weakness of left grip and intrinsic hand muscles.  Is able to move all 4 extremities well against gravity.  Fine finger movements are diminished on the left.  Orbits right over left upper extremity.  Sensation appears intact.  Gait not tested. ASSESSMENT/PLAN [name]. is a 68 y.o. male with history of DB and HTN presenting with COVID 19. Hx gait difficulties for a few weeks. In hospital developed slurred speech. MRI confirmed new acute embolic stroke.   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 Old L frontal lobe infarct. Small vessel disease.   CTA head & neck short dissection proximal L CCA. biapical ground glass B lun.g apices - pulm edema vs infection. Aortic atherosclerosis.   MRI  Small L pontine, L occipital and R frontal lobe infarcts. Severe small vessel disease and chronic infarcts  Carotid Doppler   B ICA 1-39% stenosis, VAs antegrade   2D Echo EF 30-35%. Severe LV dysfunction. Apical LV aneurysm. No thrombus noted.   LDL 49  HgbA1c 6.3  IV heparin for VTE prophylaxis  aspirin 81 mg daily and Eliquis (apixaban) daily prior to admission with Eliquis on hold per notes, now on heparin IV. Transition to warfarin for secondary stroke prevention given renal failure.   Therapy recommendations:  SNF  Disposition:  pending   Acute respiratory failure d/t SARS, COVID 19 PNA  ESRD  On HD TTS  Last HD 12/24  Hx R renal transplant  Hypertension  Stable . Permissive hypertension (OK if < 220/120) but gradually normalize in 5-7 days . Long-term BP goal normotensive  Hyperlipidemia  Home meds:  lipitor 40, resumed in hospital  LDL 49, goal < 70  Continue statin at discharge  Diabetes type II Controlled  HgbA1c 6.3, goal < 7.0  Dysphagia . Secondary to stroke    Diet   DIET - DYS 1 Room service appropriate? No; Fluid consistency: Nectar Thick  .  Speech on board  Other Stroke Risk Factors  Advanced age  Former Cigarette smoker  CAD  Hx stroke/TIA   11/2017 - R frontal deep white matter and subcortical infarcts ([facility]). Presented w/ AMS with eyes deviated left, non-responsive to verbal stimuli. 2D showed persistent LV thrombus. D.c to SNF on lovenox bridge to warfarin  12 beat VT  Other Active Problems  Anemia of chronic dz  Chronic hepatitis C w/o hepatic coma  Hospital day # 3  I have personally obtained history,examined this patient, reviewed notes, independently viewed imaging studies, participated in medical decision making and plan of care.ROS completed by me personally and pertinent positives fully documented  I have made any additions or clarifications directly to the above note.  He presented with Covid infection along with dysarthria and facial weakness secondary to multiple embolic infarcts with background of LV aneurysm on anticoagulation which  had been on hold.  He remains hypercoagulable from both his Covid infection as well as history of LV clot hence recommend anticoagulation with IV heparin but start warfarin since he may not be eligible for Eliquis due to his impaired renal function anymore.  Physical occupational and speech therapy consults.  Discussed with patient and Dr. Cathlean Sauer.  Greater than 50% time during this 25-minute visit was spent in counseling and coordination of care and answering questions.  Stroke team will sign off.  Kindly call for questions.  Antony Contras, MD Medical Director [facility] Pager: [phone] 08/02/2019 1:34 PM   To contact Stroke Continuity provider, please refer to [email or web address] After hours, contact General Neurology

## 2019-08-02 NOTE — Progress Notes (Addendum)
Per pharmacy, will continue Heparin drip @ 8.5 ml/hr.

## 2019-08-02 NOTE — Progress Notes (Addendum)
ANTICOAGULATION CONSULT NOTE - Initial Consult  Pharmacy Consult for Heparin and Warfarin Indication: h/o LV thrombus and new CVA   No Known Allergies  Patient Measurements: Height: 5\' 11"  (180.3 cm) Weight: 170 lb 3.1 oz (77.2 kg) IBW/kg (Calculated) : 75.3  Vital Signs: Temp: 97.8 F (36.6 C) (12/25 0748) Temp Source: Oral (12/25 0748) BP: 112/57 (12/25 0748) Pulse Rate: 82 (12/25 0748)  Labs: Recent Labs    07/30/19 1843 07/31/19 0559 07/31/19 0634 08/01/19 0340 08/01/19 0936 08/01/19 1212 08/01/19 1420 08/01/19 1910 08/02/19 0311 08/02/19 0630  HGB 9.6*  --   --   --   --  10.2*  --   --  9.7*  --   HCT 29.3*  --   --   --   --  30.9*  --   --  27.8*  --   PLT 251  --   --   --   --  361  --   --  315  --   APTT  --   --  36  --  35  --   --  141*  --  82*  LABPROT  --   --  17.2*  --   --   --   --   --   --   --   INR  --   --  1.4*  --   --   --   --   --   --   --   HEPARINUNFRC  --   --  >2.20*  --  >2.20*  --  >2.20*  --   --  1.94*  CREATININE 7.21*  --   --  10.30*  --   --   --   --  7.07*  --   TROPONINIHS 167* 140*  --   --   --   --   --   --   --   --     Estimated Creatinine Clearance: 10.9 mL/min (A) (by C-G formula based on SCr of 7.07 mg/dL (H)).   Medical History: Past Medical History:  Diagnosis Date  . Diabetes mellitus without complication ([place])   . Hypertension   . Renal disorder 2015   right kidney transplant  . Stroke [facility])     Assessment: 68 y.o. 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 Last Eliquis dose ~1000 on 12/22. Patient is currently on heparin drip at 850 units/hr for new CVA (MRI confirmed). Given acute CVA and risk for hemorrhagic conversion, will set aPTT and heparin level at lower end of goal as listed below. Baseline heparin level before starting infusion was >2.20, indicating that apixaban is still influencing heparin levels at this point, so will monitor  anticoagulation using aPTT until apixaban is no longer influencing heparin levels.  Pharmacy has now been consulted to start bridge from heparin IV infusion to warfarin in this patient.   Morning Labs: aPTT this morning on 850 units/hr is 82 sec, which is within the goal range for this pt. Heparin level is still elevated to 1.94 (due to apixaban dose on 12/22). Heparin level and aPTT is still not correlating due to apixaban influence. Per RN, no issues with IV or bleeding observed.  Afternoon Labs: Confirmatory aPTT/Heparin: aPTT is slightly super-therapuetic at 107 sec; Heparin level is 1.64. Baseline INR 1.5  Goal of Therapy:  aPTT 66-85s Heparin level 0.3-0.5 units/ml INR goal 2 - 3 Monitor platelets by  anticoagulation protocol: Yes   Plan:  Reduce heparin infusion to 750 units/hr Start warfarin 5 mg for one dose at 1800 Reassess aPTT and Heparin level at 6 hours (2200) Obtain baseline INR  Monitor daily aPTT, Heparin level, INR, daily CBC and signs/symptoms of bleeding Continue heparin until INR is therapeutic   Acey Lav, PharmD  PGY1 Acute Care Pharmacy Resident [phone]

## 2019-08-02 NOTE — Progress Notes (Signed)
ANTICOAGULATION CONSULT NOTE  Pharmacy Consult for Heparin and Warfarin Indication: h/o LV thrombus and new CVA   No Known Allergies  Patient Measurements: Height: 5\' 11"  (180.3 cm) Weight: 170 lb 3.1 oz (77.2 kg) IBW/kg (Calculated) : 75.3  Vital Signs: Temp: 97.7 F (36.5 C) (12/25 1928) Temp Source: Oral (12/25 1928) BP: 126/93 (12/25 1600) Pulse Rate: 69 (12/25 1600)  Labs: Recent Labs    07/31/19 0559 07/31/19 0634 08/01/19 0340 08/01/19 0936 08/01/19 1212 08/01/19 1420 08/02/19 0311 08/02/19 0630 08/02/19 1315 08/02/19 2143  HGB  --   --   --   --  10.2*  --  9.7*  --   --   --   HCT  --   --   --   --  30.9*  --  27.8*  --   --   --   PLT  --   --   --   --  361  --  315  --   --   --   APTT  --  36  --    < >  --   --   --  82* 107* 105*  LABPROT  --  17.2*  --   --   --   --   --   --  18.2*  --   INR  --  1.4*  --   --   --   --   --   --  1.5*  --   HEPARINUNFRC  --  >2.20*  --   --   --  >2.20*  --  1.94* 1.64*  --   CREATININE  --   --  10.30*  --   --   --  7.07*  --   --   --   TROPONINIHS 140*  --   --   --   --   --   --   --   --   --    < > = values in this interval not displayed.    Estimated Creatinine Clearance: 10.9 mL/min (A) (by C-G formula based on SCr of 7.07 mg/dL (H)).   Assessment: 68 y.o. 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 Last Eliquis dose ~1000 on 12/22. Patient is currently on heparin drip at 850 units/hr for new CVA (MRI confirmed). Given acute CVA and risk for hemorrhagic conversion, will set aPTT and heparin level at lower end of goal as listed below. Baseline heparin level before starting infusion was >2.20, indicating that apixaban is still influencing heparin levels at this point, so will monitor anticoagulation using aPTT initially  Pharmacy has now been consulted to start bridge from heparin IV infusion to warfarin in this patient.  APTT now 105 sec  Goal of  Therapy:  aPTT 66-85s Heparin level 0.3-0.5 units/ml INR goal 2 - 3 Monitor platelets by anticoagulation protocol: Yes   Plan:  Reduce heparin infusion to 650 units/hr Check heparin level and aPTT ~ 6 hours after rate change  Thanks for allowing pharmacy to be a part of this patient's care.  Excell Seltzer, PharmD Clinical Pharmacist

## 2019-08-03 LAB — FERRITIN: Ferritin: 3426 ng/mL — ABNORMAL HIGH (ref 24–336)

## 2019-08-03 LAB — COMPREHENSIVE METABOLIC PANEL
ALT: 17 U/L (ref 0–44)
AST: 12 U/L — ABNORMAL LOW (ref 15–41)
Albumin: 2.5 g/dL — ABNORMAL LOW (ref 3.5–5.0)
Alkaline Phosphatase: 44 U/L (ref 38–126)
Anion gap: 18 — ABNORMAL HIGH (ref 5–15)
BUN: 84 mg/dL — ABNORMAL HIGH (ref 8–23)
CO2: 26 mmol/L (ref 22–32)
Calcium: 7.5 mg/dL — ABNORMAL LOW (ref 8.9–10.3)
Chloride: 95 mmol/L — ABNORMAL LOW (ref 98–111)
Creatinine, Ser: 9.62 mg/dL — ABNORMAL HIGH (ref 0.61–1.24)
GFR calc Af Amer: 6 mL/min — ABNORMAL LOW (ref >60)
GFR calc non Af Amer: 5 mL/min — ABNORMAL LOW (ref >60)
Glucose, Bld: 151 mg/dL — ABNORMAL HIGH (ref 70–99)
Potassium: 4.2 mmol/L (ref 3.5–5.1)
Sodium: 139 mmol/L (ref 135–145)
Total Bilirubin: 0.4 mg/dL (ref 0.3–1.2)
Total Protein: 6.3 g/dL — ABNORMAL LOW (ref 6.5–8.1)

## 2019-08-03 LAB — CBC
HCT: 27.5 % — ABNORMAL LOW (ref 39.0–52.0)
Hemoglobin: 9.2 g/dL — ABNORMAL LOW (ref 13.0–17.0)
MCH: 33.9 pg (ref 26.0–34.0)
MCHC: 33.5 g/dL (ref 30.0–36.0)
MCV: 101.5 fL — ABNORMAL HIGH (ref 80.0–100.0)
Platelets: 322 10*3/uL (ref 150–400)
RBC: 2.71 MIL/uL — ABNORMAL LOW (ref 4.22–5.81)
RDW: 14.4 % (ref 11.5–15.5)
WBC: 9.9 10*3/uL (ref 4.0–10.5)
nRBC: 1.1 % — ABNORMAL HIGH (ref 0.0–0.2)

## 2019-08-03 LAB — PROTIME-INR
INR: 1.4 — ABNORMAL HIGH (ref 0.8–1.2)
Prothrombin Time: 16.8 seconds — ABNORMAL HIGH (ref 11.4–15.2)

## 2019-08-03 LAB — HEPARIN LEVEL (UNFRACTIONATED)
Heparin Unfractionated: 1.14 IU/mL — ABNORMAL HIGH (ref 0.30–0.70)
Heparin Unfractionated: 1 IU/mL — ABNORMAL HIGH (ref 0.30–0.70)

## 2019-08-03 LAB — APTT
aPTT: 59 seconds — ABNORMAL HIGH (ref 24–36)
aPTT: 81 seconds — ABNORMAL HIGH (ref 24–36)
aPTT: 77 seconds — ABNORMAL HIGH (ref 24–36)

## 2019-08-03 LAB — D-DIMER, QUANTITATIVE: D-Dimer, Quant: 0.6 ug/mL-FEU — ABNORMAL HIGH (ref 0.00–0.50)

## 2019-08-03 LAB — GLUCOSE, CAPILLARY
Glucose-Capillary: 192 mg/dL — ABNORMAL HIGH (ref 70–99)
Glucose-Capillary: 239 mg/dL — ABNORMAL HIGH (ref 70–99)
Glucose-Capillary: 296 mg/dL — ABNORMAL HIGH (ref 70–99)
Glucose-Capillary: 308 mg/dL — ABNORMAL HIGH (ref 70–99)
Glucose-Capillary: 309 mg/dL — ABNORMAL HIGH (ref 70–99)
Glucose-Capillary: 403 mg/dL — ABNORMAL HIGH (ref 70–99)

## 2019-08-03 LAB — C-REACTIVE PROTEIN: CRP: 5.6 mg/dL — ABNORMAL HIGH (ref <1.0)

## 2019-08-03 MED ORDER — SODIUM CHLORIDE 0.9% FLUSH
3.0000 mL | Freq: Two times a day (BID) | INTRAVENOUS | Status: DC
  Administered 2019-08-03 – 2019-09-21 (×68): 3 mL via INTRAVENOUS

## 2019-08-03 MED ORDER — SODIUM CHLORIDE 0.9% FLUSH: 3.0000 mL | INTRAVENOUS | Status: DC | PRN

## 2019-08-03 MED ORDER — WARFARIN SODIUM 5 MG PO TABS
5.0000 mg | ORAL_TABLET | Freq: Once | ORAL | Status: AC
  Filled 2019-08-03: qty 1

## 2019-08-03 NOTE — Progress Notes (Addendum)
[facility] Progress Note   Subjective:  BP/HR stable overnight. On 2L [place] 91% SpO2 this am.   Objective Vitals:   08/03/19 0302 08/03/19 0303 08/03/19 0405 08/03/19 0800  BP: (!) 103/93   111/81  Pulse: 69   70  Resp: (!) 22   18  Temp: 97.7 F (36.5 C) 97.7 F (36.5 C)    TempSrc: Axillary Oral    SpO2: 91%   91%  Weight:   73.1 kg   Height:        Weight change:    Additional Objective Labs: Basic Metabolic Panel: Recent Labs  Lab 08/01/19 0340 08/02/19 0311 08/03/19 0500  NA 143 138 139  K 4.8 3.9 4.2  CL 97* 93* 95*  CO2 24 25 26   GLUCOSE 194* 170* 151*  BUN 78* 52* 84*  CREATININE 10.30* 7.07* 9.62*  CALCIUM 7.4* 7.5* 7.5*   CBC: Recent Labs  Lab 07/30/19 1843 08/01/19 1212 08/02/19 0311 08/03/19 0500  WBC 6.8 7.0 9.0 9.9  NEUTROABS 5.6  --   --   --   HGB 9.6* 10.2* 9.7* 9.2*  HCT 29.3* 30.9* 27.8* 27.5*  MCV 103.2* 103.7* 98.9 101.5*  PLT 251 361 315 322   Blood Culture    Component Value Date/Time   SDES BLOOD SITE NOT SPECIFIED 07/30/2019 1843   SPECREQUEST  07/30/2019 1843    BOTTLES DRAWN AEROBIC AND ANAEROBIC Blood Culture results may not be optimal due to an inadequate volume of blood received in culture bottles   CULT  07/30/2019 1843    NO GROWTH 4 DAYS Performed at [facility], [place] [address] [place], [place] [address]    REPTSTATUS PENDING 07/30/2019 1843     Medications: . sodium chloride    . sodium chloride    . heparin 700 Units/hr (08/03/19 0903)  . remdesivir 100 mg in NS 100 mL 100 mg (08/02/19 0852)   .  stroke: mapping our early stages of recovery book   Does not apply Once  . allopurinol  100 mg Oral Daily  . aspirin  81 mg Oral Daily  . atorvastatin  40 mg Oral Daily  . calcium acetate  1,334 mg Oral TID WC  . Chlorhexidine Gluconate Cloth  6 each Topical Q0600  . [START ON 08/07/2019] darbepoetin (ARANESP) injection - DIALYSIS  100 mcg Intravenous Q Wed-HD  . dexamethasone (DECADRON)  injection  6 mg Intravenous Q24H  . insulin aspart  0-6 Units Subcutaneous Q4H  . metoCLOPramide  5 mg Oral TID WC  . metoprolol succinate  50 mg Oral Daily  . sevelamer carbonate  2.4 g Oral TID WC  . warfarin  5 mg Oral ONCE-1800  . Warfarin - Pharmacist Dosing Inpatient   Does not apply q1800   Physical Exam Due to the nature of this patient's [id number] with isolation and in keeping with efforts to prevent the spread of infection and to conserve personal protective equipment, a physical exam was not personally performed.  A chart review of other providers notes and the patient's lab work as well as review of other pertinent studies was performed.      Dialysis Orders:  Currently GO TTS2 - previously [facility] 4.25 hr 400/800 EDW 80 2K 2 Ca - leaving below edw - last post wt 77.3 AVF heparin 8000 venofer 50/week parsabiv 7.5 - no calcitriol Mircera 75 - last 12/17    Assessment/Plan: 1. COVID-19 PNA - Remdesivir/Decadron per primary.  2. ESRD -  HD TTS. Next HD 12/27 per holiday schedule.  3. HTN/volume-  BP low/stable. On metoprolol 50. UF 1-1.5L on HD as tolerated.  4. Anemia-  Hgb 9.2.  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 On PhosLo + Renvela binder. Check Phos with next HD.  6. Nutrition - DYS 1 diet per SLP.  7. 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 Heparin/warfarin bridge per pharmacy. Will need SNF at discharge.   Lynnda Child PA-C [facility] Pager [phone] 08/03/2019,9:46 AM  LOS: 4 days

## 2019-08-03 NOTE — Progress Notes (Addendum)
ANTICOAGULATION CONSULT NOTE  Pharmacy Consult for Heparin and Warfarin Indication: h/o LV thrombus and new CVA   No Known Allergies  Patient Measurements: Height: 5\' 11"  (180.3 cm) Weight: 161 lb 2.5 oz (73.1 kg) IBW/kg (Calculated) : 75.3  Vital Signs: BP: 111/81 (12/26 0800) Pulse Rate: 70 (12/26 0800)  Labs: Recent Labs    08/01/19 0340 08/01/19 0936 08/01/19 1212 08/01/19 1420 08/02/19 0311 08/02/19 1315 08/02/19 2143 08/03/19 0500 08/03/19 0538 08/03/19 1143 08/03/19 1514  HGB  --    < > 10.2*  --  9.7*  --   --  9.2*  --   --   --   HCT  --   --  30.9*  --  27.8*  --   --  27.5*  --   --   --   PLT  --   --  361  --  315  --   --  322  --   --   --   APTT  --   --   --    < >  --  107* 105*  --  59*  --  81*  LABPROT  --   --   --   --   --  18.2*  --  16.8*  --   --   --   INR  --   --   --   --   --  1.5*  --  1.4*  --   --   --   HEPARINUNFRC  --   --   --   --   --  1.64*  --  1.14*  --  1.00*  --   CREATININE 10.30*  --   --   --  7.07*  --   --  9.62*  --   --   --    < > = values in this interval not displayed.    Estimated Creatinine Clearance: 7.8 mL/min (A) (by C-G formula based on SCr of 9.62 mg/dL (H)).   Assessment: 68 y.o. 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 Last Eliquis dose ~1000 on 12/22. Patient is currently on heparin drip at 650 units/hr for new CVA (MRI confirmed).   Given acute CVA and risk for hemorrhagic conversion, will set aPTT and heparin level at lower end of goal as listed below. Baseline heparin level before starting infusion was >2.20, indicating that apixaban is still influencing heparin levels at this point. Due to recent [place], will monitor anticoagulation using aPTT initially  Pharmacy has now been consulted to start bridge from heparin IV infusion to warfarin in this patient.   APTT came back therapeutic at 81, on 700 units/hr. Heparin level still falsely  elevated at 1 (not correlating). CBC stable this morning. No s/sx of bleeding.   Goal of Therapy:  aPTT 66-85s Heparin level 0.3-0.5 units/ml INR goal 2 - 3 Monitor platelets by anticoagulation protocol: Yes   Plan:  Will continue heparin infusion at 700 units/hr Will confirm repeat aPTT in 6 hrs.  Warfarin 5 mg PO x1 dose   - Obtain daily INR   - Montior for signs/symptoms of bleeding  Continue both heparin infusion and Warfarin until INR is therapuetic   Thanks for allowing pharmacy to be a part of this patient's care.  Antonietta Jewel, PharmD, BCCCP Clinical Pharmacist  Phone: [phone]  Please check AMION for all [place] phone numbers After 10:00 PM, call Main  Pharmacy [phone]  ADDENDUM aPTT came back therapeutic at 77, on 700 units/hr. No s/sx of bleeding or infusion issues. Continue at same rate and will get levels with AM labs.   Antonietta Jewel, PharmD, [place] Clinical Pharmacist

## 2019-08-03 NOTE — TOC Progression Note (Signed)
Transition of Care (TOC) - Progression Note    Patient Details  Name: Jonathan Mahannah Sr. MRN: [id number] Date of Birth: [birth year]  Transition of Care [facility]) CM/SW [place], [place] Phone Number: [phone] 08/03/2019, 11:23 AM  Clinical Narrative:     CSW has faxed out referrals to SNFs, pending bed offers at this time.    Expected Discharge Plan: [place]    Expected Discharge Plan and Services Expected Discharge Plan: [place] arrangements for the past 2 months: Single Family Home                                       Social Determinants of Health (SDOH) Interventions    Readmission Risk Interventions No flowsheet data found.

## 2019-08-03 NOTE — NC FL2 (Signed)
[place] MEDICAID FL2 LEVEL OF CARE SCREENING TOOL     IDENTIFICATION  Patient Name: Jonathan Mitchum Sr. Birthdate: [birth year] Sex: male Admission Date (Current Location): 07/30/2019  [facility] and [place] Number:  Herbalist and Address:  [facility], [place] [address], [place], [place] [address]      Provider Number: [id number]  Attending Physician Name and Address:  Tawni Millers,*  Relative Name and Phone Number:  Pts wife Mousa Hauber [phone]    Current Level of Care: Hospital Recommended Level of Care: [place] Prior Approval Number:    Date Approved/Denied:   PASRR Number: [id number]  Discharge Plan: SNF    Current Diagnoses: Patient Active Problem List   Diagnosis Date Noted  . Cerebral thrombosis with cerebral infarction 08/02/2019  . Cerebral embolism with cerebral infarction 08/02/2019  . Acute respiratory failure due to COVID-19 ([place]) 07/30/2019  . Acute respiratory failure with hypoxia ([place]) 07/30/2019  . Chronic systolic HF (heart failure) ([place]) 02/25/2019  . ESRD (end stage renal disease) on dialysis ([place]) 02/25/2019  . Generalized abdominal pain 07/16/2018  . Arteriovenous fistula for hemodialysis in place, primary ([place]) 05/15/2018  . 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 Acute blood loss anemia 12/14/2017  . Hypomagnesemia 12/03/2017  . Anticoagulated 12/02/2017  . Stroke ([place]) 11/29/2017  . 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 History of MI (myocardial infarction) 11/28/2017  . Hypertension 11/28/2017  . Chronic hepatitis C without hepatic coma ([place]) 09/25/2017  . Diabetes mellitus type 2, uncomplicated ([place]) [id number]  . Myocardial infarction ([place]) 09/25/2017  . Renal transplant recipient 09/25/2017  . Transfusion history 09/25/2017  . History of coronary artery disease 09/12/2016  . 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 MI ([place]) 09/12/2016   . Aftercare following organ transplant 09/11/2016  . 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 At risk for infection transmitted from donor 06/16/2014  . Ureteral stent displacement ([place]) 06/10/2014  . Prophylactic antibiotic 06/09/2014  . Immunosuppression ([place]) 06/06/2014    Orientation RESPIRATION BLADDER Height & Weight     Self, Place  Normal Incontinent, External catheter Weight: 161 lb 2.5 oz (73.1 kg) Height:  5\' 11"  (180.3 cm)  BEHAVIORAL SYMPTOMS/MOOD NEUROLOGICAL BOWEL NUTRITION STATUS      Continent Diet(see discharge summary)  AMBULATORY STATUS COMMUNICATION OF NEEDS Skin   Limited Assist Verbally                         Personal Care Assistance Level of Assistance  Bathing, Feeding, Dressing Bathing Assistance: Limited assistance Feeding assistance: Limited assistance Dressing Assistance: Limited assistance     Functional Limitations Info  Sight, Speech Sight Info: Impaired   Speech Info: Impaired(slurred per documentation)    SPECIAL CARE FACTORS FREQUENCY  PT (By licensed PT), OT (By licensed OT)     PT Frequency: min 5x weekly OT Frequency: min 5x weekly            Contractures Contractures Info: Not present    Additional Factors Info  Code Status, Allergies, Isolation Precautions Code Status Info: Full Allergies Info: No Known Allergies     Isolation Precautions Info: air/contact precautions COVID 19     Current Medications (08/03/2019):  This is the current hospital active medication list Current Facility-Administered Medications  Medication Dose Route Frequency Provider Last Rate Last Admin  .  stroke: mapping our early stages of recovery book   Does  not apply Once Arrien, Jimmy Picket, MD   Stopped at 07/31/19 2031  . 0.9 %  sodium chloride infusion  100 mL Intravenous PRN Alric Seton, PA-C      . 0.9 %  sodium chloride infusion  100 mL Intravenous PRN Alric Seton, PA-C      . acetaminophen  (TYLENOL) tablet 650 mg  650 mg Oral Q6H PRN Rise Patience, MD       Or  . acetaminophen (TYLENOL) suppository 650 mg  650 mg Rectal Q6H PRN Rise Patience, MD      . allopurinol (ZYLOPRIM) tablet 100 mg  100 mg Oral Daily Tawni Millers, MD   100 mg at 08/03/19 1018  . aspirin chewable tablet 81 mg  81 mg Oral Daily Arrien, Jimmy Picket, MD   81 mg at 08/03/19 1018  . atorvastatin (LIPITOR) tablet 40 mg  40 mg Oral Daily Arrien, Jimmy Picket, MD   40 mg at 08/03/19 1018  . calcium acetate (PHOSLO) capsule 1,334 mg  1,334 mg Oral TID WC Arrien, Jimmy Picket, MD   1,334 mg at 08/03/19 0856  . Chlorhexidine Gluconate Cloth 2 % PADS 6 each  6 each Topical Q0600 Alric Seton, PA-C   6 each at 08/03/19 0600  . [START ON 08/07/2019] Darbepoetin Alfa (ARANESP) injection 100 mcg  100 mcg Intravenous Q Wed-HD Alric Seton, PA-C   100 mcg at 08/01/19 1856  . dexamethasone (DECADRON) injection 6 mg  6 mg Intravenous Q24H Rise Patience, MD   6 mg at 08/02/19 2116  . heparin ADULT infusion 100 units/mL (25000 units/228mL sodium chloride 0.45%)  700 Units/hr Intravenous Continuous Gillian Scarce, RPH 7 mL/hr at 08/03/19 [id number] 700 Units/hr at 08/03/19 0903  . insulin aspart (novoLOG) injection 0-6 Units  0-6 Units Subcutaneous Q4H Rise Patience, MD   3 Units at 08/03/19 0930  . lidocaine (PF) (XYLOCAINE) 1 % injection 5 mL  5 mL Intradermal PRN Alric Seton, PA-C      . lidocaine-prilocaine (EMLA) cream 1 application  1 application Topical PRN Alric Seton, PA-C      . metoCLOPramide (REGLAN) tablet 5 mg  5 mg Oral TID WC Arrien, Jimmy Picket, MD   5 mg at 08/03/19 0855  . metoprolol succinate (TOPROL-XL) 24 hr tablet 50 mg  50 mg Oral Daily Arrien, Jimmy Picket, MD   50 mg at 08/03/19 1018  . ondansetron (ZOFRAN) tablet 4 mg  4 mg Oral Q6H PRN Rise Patience, MD       Or  . ondansetron [facility]) injection 4 mg  4 mg Intravenous Q6H PRN  Rise Patience, MD      . pentafluoroprop-tetrafluoroeth (GEBAUERS) aerosol 1 application  1 application Topical PRN Alric Seton, PA-C      . Resource ThickenUp Clear   Oral PRN Arrien, Jimmy Picket, MD      . [facility]) tablet 8.6 mg  1 tablet Oral PRN Arrien, Jimmy Picket, MD      . sevelamer carbonate (RENVELA) powder PACK 2.4 g  2.4 g Oral TID WC Arrien, Jimmy Picket, MD   2.4 g at 08/03/19 0854  . sodium chloride flush (NS) 0.9 % injection 3 mL  3 mL Intravenous Q12H Arrien, Jimmy Picket, MD   3 mL at 08/03/19 1024  . sodium chloride flush (NS) 0.9 % injection 3 mL  3 mL Intravenous PRN Arrien, Jimmy Picket, MD      . warfarin (COUMADIN) tablet 5 mg  5 mg Oral ONCE-1800 Gillian Scarce, [facility]      . Warfarin - Pharmacist Dosing Inpatient   Does not apply q1800 Gillian Scarce [facility]   Given at 08/02/19 1729     Discharge Medications: Please see discharge summary for a list of discharge medications.  Relevant Imaging Results:  Relevant Lab Results:   Additional Information SSN: [id number]  Alberteen Sam, LCSW

## 2019-08-03 NOTE — Progress Notes (Signed)
[place] for Heparin and Warfarin Indication: h/o LV thrombus and new CVA   No Known Allergies  Patient Measurements: Height: 5\' 11"  (180.3 cm) Weight: 161 lb 2.5 oz (73.1 kg) IBW/kg (Calculated) : 75.3  Vital Signs: Temp: 97.7 F (36.5 C) (12/26 0303) Temp Source: Oral (12/26 0303) BP: 103/93 (12/26 0302) Pulse Rate: 69 (12/26 0302)  Labs: Recent Labs    08/01/19 0340 08/01/19 0936 08/01/19 1212 08/01/19 1420 08/02/19 0311 08/02/19 0630 08/02/19 1315 08/02/19 2143 08/03/19 0500 08/03/19 0538  HGB  --    < > 10.2*  --  9.7*  --   --   --  9.2*  --   HCT  --   --  30.9*  --  27.8*  --   --   --  27.5*  --   PLT  --   --  361  --  315  --   --   --  322  --   APTT  --   --   --    < >  --  82* 107* 105*  --  59*  LABPROT  --   --   --   --   --   --  18.2*  --  16.8*  --   INR  --   --   --   --   --   --  1.5*  --  1.4*  --   HEPARINUNFRC  --   --   --   --   --  1.94* 1.64*  --  1.14*  --   CREATININE 10.30*  --   --   --  7.07*  --   --   --  9.62*  --    < > = values in this interval not displayed.    Estimated Creatinine Clearance: 7.8 mL/min (A) (by C-G formula based on SCr of 9.62 mg/dL (H)).   Assessment: 68 y.o. 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 Last Eliquis dose ~1000 on 12/22. Patient is currently on heparin drip at 650 units/hr for new CVA (MRI confirmed).   Given acute CVA and risk for hemorrhagic conversion, will set aPTT and heparin level at lower end of goal as listed below. Baseline heparin level before starting infusion was >2.20, indicating that apixaban is still influencing heparin levels at this point. Due to recent [place], will monitor anticoagulation using aPTT initially  Pharmacy has now been consulted to start bridge from heparin IV infusion to warfarin in this patient.   12/26 AM labs: APTT 59 sec (Subtherapuetic)  Heparin Level: 1.14  IU/mL (Not correlating with aPPT yet) INR 1.4 (Subtherapuetic)  Goal of Therapy:  aPTT 66-85s Heparin level 0.3-0.5 units/ml INR goal 2 - 3 Monitor platelets by anticoagulation protocol: Yes   Plan:  Slightly increase heparin infusion to 700 units/hr  - Check heparin level and aPTT ~ 6 hours after rate change  - Obtain daily aPTT and heparin level Warfarin 5 mg PO x1 dose   - Obtain daily INR   - Montior for signs/symptoms of bleeding  Continue both heparin infusion and Warfarin until INR is therapuetic   Thanks for allowing pharmacy to be a part of this patient's care.  Acey Lav, PharmD  PGY1 Acute Care Pharmacy Resident [phone]

## 2019-08-03 NOTE — Progress Notes (Signed)
While attempting to give patient medications at 1830, he became quite irritable and stated he would not take his meds, despite repeated attempts to persuade him.

## 2019-08-03 NOTE — Progress Notes (Signed)
APP K.Kirby made aware of asymptomatic 5 beat run V-tach plus 20 beat aberrant beat run. EF 30-35%. This has happened several times this admission. Will continue to monitor.

## 2019-08-03 NOTE — Progress Notes (Signed)
PROGRESS NOTE    Jonathan Juby Sr.  [id number] DOB: 10-16-1950 DOA: 07/30/2019 PCP: Marton Redwood, MD    Brief Narrative:  68 year old male who presented with difficulty swallowing. He does have significant past medical history for end-stage renal disease on hemodialysis (T, TH, Sat),history of renal transplant, history of stroke, hypertension, type 2 diabetes mellitus,andleft ventricle thrombus. His wife noticed him having difficulty swallowing for about 2 weeks. He tested positive for COVID-19December 16.For the last 4 days patient has been noted to have dyspnea, lethargy and confusion. Patient missed his dialysis session on the day of admission. On his initial physical examination his heart rate was 102, respiratory rate 28, oxygen saturation 80%, his lungs had no rales or rhonchi, heart S1-S2 present andrhythmic, abdomen soft, no lower extremity edema, patient was nonfocal. Sodium 137, potassium 3.7, chloride 93, bicarb 28, glucose 144, BUN 29, creatinine 7.2, white count 6.8, hemoglobin 9.6, hematocrit 29.3, platelets 251.Head CT with small left pontine infarct, age indeterminate. No intracranial hemorrhage. Old left frontal lobe infarct superimposed on chronic ischemic microangiopathy. Chest radiograph with right upper lobe, right lower lobe and left lower lobe interstitial infiltrate. EKG 81 bpm, normal axis, QTC 526, sinus rhythm, septal Q waves, no ST segment changes, negative T lead II,III, aVF and through the precordium.Low voltage  Patient was admitted to the hospital with a working diagnosis of acute hypoxic respiratory failure due to SARS COVID-19 viral pneumonia.  Troponin I elevation 167-140 due to acute viral infection, EKG with no acute changes, ruled out ACS.  Further work up with brain MRI showed smallacute infarcts in the left pons, left occipital lobe and bilateral frontal lobes.Initially failed swallow evaluation, but fortunately swallow has  improved and patient has been placed on dysphagia 1 diet with aspiration precautions.   Case discussed with Dr Leonie Man from neurology and will resume oral anticoagulation with warfarin.  Assessment & Plan:   Principal Problem:   Acute respiratory failure due to COVID-19 [facility]) Active Problems:   Chronic hepatitis C without hepatic coma ([name])   Diabetes mellitus type 2, uncomplicated ([place])   History of coronary artery disease   Hypertension   ESRD (end stage renal disease) on dialysis ([place])   Acute respiratory failure with hypoxia ([name])   Cerebral thrombosis with cerebral infarction   Cerebral embolism with cerebral infarction   1. Acute hypoxic respiratory failure due to SARS COVID 19 viral pneumonia.  RR: 18  Pulse oxymetry: 91%  Fi02: 2 L/min per [place]   COVID-19 Labs  Recent Labs    08/01/19 0340 08/02/19 0311 08/03/19 0500  DDIMER 0.83* 0.79* 0.60*  FERRITIN 4,004* 4,104* 3,426*  CRP 11.6* 7.8* 5.6*    Lab Results  Component Value Date   SARSCOV2NAA POSITIVE (A) 07/24/2019    Trending down inflammatory markers.  Patient has completed medical therapy with Remdesivir #5/5,will continue for now with systemic corticosteroids with dexamethasone 6 mg IV q 24H . Continue withantitussive agents.   2. Acute CVA/ Possible embolic phenomena.Small acute infarcts in the left pons, left occipital lobe and bilateral frontal lobes.Multiple chronic infarcts. LDL 52. Echocardiogram with LV EF 30 to 35%, severe apical dyskinesis, apical left ventricular aneurysm.    Patient has remained stable, no frank focal deficit. Continue to have slow verbal responsiveness, but no confusion. Will continue anticoagulation with warfarin and heparin bridge, target INR 2 to 3. Continue aspirin and statin. Blood pressure monitoring.    3. HTN.Blood pressure today 111/81 mmHg, tolerating well metoprolol 50 mg XL.  4.  Uncontrolled T2DM.Fasting glucose is 151. Tolerating wellinsulin sliding  scale for glucose cover and monitoring. Patient now able to eat by mouth.   5. ESRD on HD, anemia of chronic renal disease/ failed renal transplant.Continue renal replacement therapy per nephrology recommendations.  Continue with phoslo, renvela and aransep.   6. LV thrombus.Anticoagulation with warfarin/ bridge with heparin.   DVT prophylaxis: warfarin and heparin   Code Status:  full Family Communication: no family at the bedside  Disposition Plan/ discharge barriers: pending SNF placement,   Subjective: Patient is feeling better, no nausea or vomiting, no chest pain,. Continue to be very weak and deconditioned.   Objective: Vitals:   08/03/19 0302 08/03/19 0303 08/03/19 0405 08/03/19 0800  BP: (!) 103/93   111/81  Pulse: 69   70  Resp: (!) 22   18  Temp: 97.7 F (36.5 C) 97.7 F (36.5 C)    TempSrc: Axillary Oral    SpO2: 91%   91%  Weight:   73.1 kg   Height:        Intake/Output Summary (Last 24 hours) at 08/03/2019 1004 Last data filed at 08/03/2019 0903 Gross per 24 hour  Intake 874.04 ml  Output 300 ml  Net 574.04 ml   Filed Weights   07/31/19 0700 07/31/19 1903 08/03/19 0405  Weight: 81.6 kg 77.2 kg 73.1 kg    Examination:   General: Not in pain or dyspnea, deconditioned  Neurology: Awake and alert, non focal  E ENT: mild pallor, no icterus, oral mucosa moist Cardiovascular: No JVD. S1-S2 present, rhythmic. No lower extremity edema. Pulmonary: positive breath sounds bilaterally.  Gastrointestinal. Abdomen with no organomegaly, non tender, no rebound or guarding Skin. No rashes Musculoskeletal: no joint deformities     Data Reviewed: I have personally reviewed following labs and imaging studies  CBC: Recent Labs  Lab 07/30/19 1843 08/01/19 1212 08/02/19 0311 08/03/19 0500  WBC 6.8 7.0 9.0 9.9  NEUTROABS 5.6  --   --   --   HGB 9.6* 10.2* 9.7* 9.2*  HCT 29.3* 30.9* 27.8* 27.5*  MCV 103.2* 103.7* 98.9 101.5*  PLT 251 361 315 [id number]   Basic  Metabolic Panel: Recent Labs  Lab 07/30/19 1843 08/01/19 0340 08/02/19 0311 08/03/19 0500  NA 137 143 138 139  K 3.7 4.8 3.9 4.2  CL 93* 97* 93* 95*  CO2 28 24 25 26   GLUCOSE 144* 194* 170* 151*  BUN 29* 78* 52* 84*  CREATININE 7.21* 10.30* 7.07* 9.62*  CALCIUM 7.4* 7.4* 7.5* 7.5*   GFR: Estimated Creatinine Clearance: 7.8 mL/min (A) (by C-G formula based on SCr of 9.62 mg/dL (H)). Liver Function Tests: Recent Labs  Lab 07/30/19 1843 08/01/19 0340 08/02/19 0311 08/03/19 0500  AST 26 17 20  12*  ALT 20 17 20 17   ALKPHOS 43 39 42 44  BILITOT 0.8 0.4 0.7 0.4  PROT 6.8 6.2* 6.4* 6.3*  ALBUMIN 2.7* 2.4* 2.6* 2.5*   No results for input(s): LIPASE, AMYLASE in the last 168 hours. No results for input(s): AMMONIA in the last 168 hours. Coagulation Profile: Recent Labs  Lab 07/31/19 0634 08/02/19 1315 08/03/19 0500  INR 1.4* 1.5* 1.4*   Cardiac Enzymes: No results for input(s): CKTOTAL, CKMB, CKMBINDEX, TROPONINI in the last 168 hours. BNP (last 3 results) No results for input(s): PROBNP in the last 8760 hours. HbA1C: No results for input(s): HGBA1C in the last 72 hours. CBG: Recent Labs  Lab 08/02/19 1703 08/02/19 1929 08/03/19 0015 08/03/19 0303 08/03/19 [id number]  GLUCAP 250* 227* 403* 192* 239*   Lipid Profile: Recent Labs    08/01/19 0340  CHOL 90  HDL 31*  LDLCALC 49  TRIG 52  CHOLHDL 2.9   Thyroid Function Tests: No results for input(s): TSH, T4TOTAL, FREET4, T3FREE, THYROIDAB in the last 72 hours. Anemia Panel: Recent Labs    08/02/19 0311 08/03/19 0500  FERRITIN 4,104* 3,426*      Radiology Studies: I have reviewed all of the imaging during this hospital visit personally     Scheduled Meds: .  stroke: mapping our early stages of recovery book   Does not apply Once  . allopurinol  100 mg Oral Daily  . aspirin  81 mg Oral Daily  . atorvastatin  40 mg Oral Daily  . calcium acetate  1,334 mg Oral TID WC  . Chlorhexidine Gluconate Cloth   6 each Topical Q0600  . [START ON 08/07/2019] darbepoetin (ARANESP) injection - DIALYSIS  100 mcg Intravenous Q Wed-HD  . dexamethasone (DECADRON) injection  6 mg Intravenous Q24H  . insulin aspart  0-6 Units Subcutaneous Q4H  . metoCLOPramide  5 mg Oral TID WC  . metoprolol succinate  50 mg Oral Daily  . sevelamer carbonate  2.4 g Oral TID WC  . warfarin  5 mg Oral ONCE-1800  . Warfarin - Pharmacist Dosing Inpatient   Does not apply q1800   Continuous Infusions: . sodium chloride    . sodium chloride    . heparin 700 Units/hr (08/03/19 0903)  . remdesivir 100 mg in NS 100 mL 100 mg (08/02/19 0852)     LOS: 4 days        Kalii Chesmore Gerome Apley, MD

## 2019-08-04 DIAGNOSIS — G9340 Encephalopathy, unspecified: Secondary | ICD-10-CM

## 2019-08-04 LAB — COMPREHENSIVE METABOLIC PANEL
ALT: 15 U/L (ref 0–44)
AST: 11 U/L — ABNORMAL LOW (ref 15–41)
Albumin: 2.4 g/dL — ABNORMAL LOW (ref 3.5–5.0)
Alkaline Phosphatase: 52 U/L (ref 38–126)
Anion gap: 18 — ABNORMAL HIGH (ref 5–15)
BUN: 106 mg/dL — ABNORMAL HIGH (ref 8–23)
CO2: 23 mmol/L (ref 22–32)
Calcium: 7.7 mg/dL — ABNORMAL LOW (ref 8.9–10.3)
Chloride: 94 mmol/L — ABNORMAL LOW (ref 98–111)
Creatinine, Ser: 11.11 mg/dL — ABNORMAL HIGH (ref 0.61–1.24)
GFR calc Af Amer: 5 mL/min — ABNORMAL LOW (ref >60)
GFR calc non Af Amer: 4 mL/min — ABNORMAL LOW (ref >60)
Glucose, Bld: 152 mg/dL — ABNORMAL HIGH (ref 70–99)
Potassium: 4.6 mmol/L (ref 3.5–5.1)
Sodium: 135 mmol/L (ref 135–145)
Total Bilirubin: 0.3 mg/dL (ref 0.3–1.2)
Total Protein: 5.5 g/dL — ABNORMAL LOW (ref 6.5–8.1)

## 2019-08-04 LAB — CBC
HCT: 26.1 % — ABNORMAL LOW (ref 39.0–52.0)
Hemoglobin: 8.9 g/dL — ABNORMAL LOW (ref 13.0–17.0)
MCH: 34.1 pg — ABNORMAL HIGH (ref 26.0–34.0)
MCHC: 34.1 g/dL (ref 30.0–36.0)
MCV: 100 fL (ref 80.0–100.0)
Platelets: 341 10*3/uL (ref 150–400)
RBC: 2.61 MIL/uL — ABNORMAL LOW (ref 4.22–5.81)
RDW: 14.2 % (ref 11.5–15.5)
WBC: 10.1 10*3/uL (ref 4.0–10.5)
nRBC: 3.4 % — ABNORMAL HIGH (ref 0.0–0.2)

## 2019-08-04 LAB — FERRITIN: Ferritin: 2745 ng/mL — ABNORMAL HIGH (ref 24–336)

## 2019-08-04 LAB — PROTIME-INR
INR: 1.5 — ABNORMAL HIGH (ref 0.8–1.2)
Prothrombin Time: 17.5 seconds — ABNORMAL HIGH (ref 11.4–15.2)

## 2019-08-04 LAB — HEPARIN LEVEL (UNFRACTIONATED)
Heparin Unfractionated: 0.6 IU/mL (ref 0.30–0.70)
Heparin Unfractionated: 0.65 IU/mL (ref 0.30–0.70)

## 2019-08-04 LAB — CULTURE, BLOOD (ROUTINE X 2): Culture: NO GROWTH

## 2019-08-04 LAB — GLUCOSE, CAPILLARY
Glucose-Capillary: 121 mg/dL — ABNORMAL HIGH (ref 70–99)
Glucose-Capillary: 131 mg/dL — ABNORMAL HIGH (ref 70–99)
Glucose-Capillary: 161 mg/dL — ABNORMAL HIGH (ref 70–99)
Glucose-Capillary: 195 mg/dL — ABNORMAL HIGH (ref 70–99)
Glucose-Capillary: 271 mg/dL — ABNORMAL HIGH (ref 70–99)
Glucose-Capillary: 278 mg/dL — ABNORMAL HIGH (ref 70–99)

## 2019-08-04 LAB — APTT
aPTT: 48 seconds — ABNORMAL HIGH (ref 24–36)
aPTT: 69 seconds — ABNORMAL HIGH (ref 24–36)

## 2019-08-04 LAB — C-REACTIVE PROTEIN: CRP: 3.8 mg/dL — ABNORMAL HIGH (ref <1.0)

## 2019-08-04 LAB — D-DIMER, QUANTITATIVE: D-Dimer, Quant: 0.61 ug/mL-FEU — ABNORMAL HIGH (ref 0.00–0.50)

## 2019-08-04 MED ORDER — WARFARIN SODIUM 5 MG PO TABS
5.0000 mg | ORAL_TABLET | Freq: Once | ORAL | Status: AC
  Administered 2019-08-04: 5 mg via ORAL
  Filled 2019-08-04: qty 1

## 2019-08-04 NOTE — Progress Notes (Signed)
[facility]  PROGRESS NOTE  Jonathan Klecha Sr. [id number] DOB: 04-05-1951 DOA: 07/30/2019 PCP: Marton Redwood, MD   Brief HPI:   68 year old male with history of ESRD on hemodialysis, Tuesday Thursday and Saturday history of renal transplant, history of stroke, hypertension, type 2 diabetes mellitus, left ventricular thrombus came to ED with difficulty swallowing.  His wife noticed him having difficulty swallowing for about 2 weeks.  He was tested positive for COVID-19 on December 16.  Chest x-ray showed right upper and right lower lobe, left lower lobe interstitial infiltrate. Patient was noted with diagnosis of acute hypoxic respiratory failure due to SARS COVID-19 viral pneumonia. Further work-up with brain MRI showed small acute infarct in the left pons, left occipital lobe and bilateral frontal lobes.  Initially failed swallow evaluation but fortunately swallowing improved, patient placed on dysphagia 1 diet with aspiration precautions.  Oral anticoagulation was resumed after discussion question with Dr. Leonie Man from neurology.    Subjective   Patient seen and examined, denies any chest pain.  No shortness of breath.   Assessment/Plan:     1. Acute hypoxic respiratory failure due to SARS COVID-19 viral pneumonia-patient is requiring 2 L/min of oxygen via nasal cannula, O2 sats 96%, respiratory rate 19/min.  Patient has completed 5 days of remdesivir treatment.  Currently on Decadron 6 mg IV every 24 hours.  COVID-19 Labs  Recent Labs    08/02/19 0311 08/03/19 0500 08/04/19 0500  DDIMER 0.79* 0.60* 0.61*  FERRITIN 4,104* 3,426* 2,745*  CRP 7.8* 5.6* 3.8*   Inflammatory markers are improving, CRP down to 3.8, ferritin slowly improving.  2.  Acute CVA-possible embolic phenomena, small acute infarct in left pons, left occipital lobe and bilateral frontal lobes.  Multiple chronic infarcts.  LDL 52.  Echocardiogram showed EF 30 to 35%, severe apical dyskinesis, apical left  ventricular aneurysm.  Patient has no focal deficit at this time.  Started on anticoagulation with warfarin with heparin bridging.  Target INR is 2-3.  Continue aspirin and statin.  3.  Hypertension-blood pressure stable, continue metoprolol succinate 50 mg daily.  4.  Uncontrolled type 2 diabetes mellitus-continue sliding scale insulin with NovoLog.  ESRD on hemodialysis/anemia of chronic disease/failed renal transplant-continue hemodialysis per nephrology.  Continue PhosLo, Renvela, Aranesp.  LV thrombus-continue anticoagulation with warfarin/heparin bridge   CBG: Recent Labs  Lab 08/04/19 0042 08/04/19 0336 08/04/19 0815 08/04/19 1154 08/04/19 1540  GLUCAP 195* 161* 131* 271* 278*    CBC: Recent Labs  Lab 07/30/19 1843 08/01/19 1212 08/02/19 0311 08/03/19 0500 08/04/19 0500  WBC 6.8 7.0 9.0 9.9 10.1  NEUTROABS 5.6  --   --   --   --   HGB 9.6* 10.2* 9.7* 9.2* 8.9*  HCT 29.3* 30.9* 27.8* 27.5* 26.1*  MCV 103.2* 103.7* 98.9 101.5* 100.0  PLT 251 361 315 322 [id number]    Basic Metabolic Panel: Recent Labs  Lab 07/30/19 1843 08/01/19 0340 08/02/19 0311 08/03/19 0500 08/04/19 0500  NA 137 143 138 139 135  K 3.7 4.8 3.9 4.2 4.6  CL 93* 97* 93* 95* 94*  CO2 28 24 25 26 23   GLUCOSE 144* 194* 170* 151* 152*  BUN 29* 78* 52* 84* 106*  CREATININE 7.21* 10.30* 7.07* 9.62* 11.11*  CALCIUM 7.4* 7.4* 7.5* 7.5* 7.7*       DVT prophylaxis: Warfarin  Code Status: Full code  Family Communication: No family at bedside  Disposition Plan: likely home when medically ready for discharge  BMI  Estimated body mass index is 22.48 kg/m as calculated from the following:   Height as of this encounter: 5\' 11"  (1.803 m).   Weight as of this encounter: 73.1 kg.  Scheduled medications:  .  stroke: mapping our early stages of recovery book   Does not apply Once  . allopurinol  100 mg Oral Daily  . aspirin  81 mg Oral Daily  . atorvastatin  40 mg Oral Daily  .  calcium acetate  1,334 mg Oral TID WC  . Chlorhexidine Gluconate Cloth  6 each Topical Q0600  . [START ON 08/07/2019] darbepoetin (ARANESP) injection - DIALYSIS  100 mcg Intravenous Q Wed-HD  . dexamethasone (DECADRON) injection  6 mg Intravenous Q24H  . insulin aspart  0-6 Units Subcutaneous Q4H  . metoCLOPramide  5 mg Oral TID WC  . metoprolol succinate  50 mg Oral Daily  . sevelamer carbonate  2.4 g Oral TID WC  . sodium chloride flush  3 mL Intravenous Q12H  . warfarin  5 mg Oral ONCE-1800  . Warfarin - Pharmacist Dosing Inpatient   Does not apply q1800       Antibiotics:   Anti-infectives (From admission, onward)   Start     Dose/Rate Route Frequency Ordered Stop   07/31/19 1000  remdesivir 100 mg in sodium chloride 0.9 % 100 mL IVPB     100 mg 200 mL/hr over 30 Minutes Intravenous Daily 07/30/19 2217 08/04/19 0748   07/31/19 0800  ceFEPIme (MAXIPIME) 1 g in sodium chloride 0.9 % 100 mL IVPB  Status:  Discontinued     1 g 200 mL/hr over 30 Minutes Intravenous Every 24 hours 07/31/19 0748 07/31/19 0816   07/31/19 0800  vancomycin (VANCOREADY) IVPB 1500 mg/300 mL  Status:  Discontinued     1,500 mg 150 mL/hr over 120 Minutes Intravenous  Once 07/31/19 0748 07/31/19 0816   07/31/19 0748  vancomycin variable dose per unstable renal function (pharmacist dosing)  Status:  Discontinued      Does not apply See admin instructions 07/31/19 0748 07/31/19 0816   07/30/19 2230  remdesivir 200 mg in sodium chloride 0.9% 250 mL IVPB     200 mg 580 mL/hr over 30 Minutes Intravenous Once 07/30/19 2217 07/31/19 0045       Objective   Vitals:   08/04/19 0332 08/04/19 0830 08/04/19 1159 08/04/19 1200  BP: 103/64 (!) 135/112 (!) 135/54   Pulse: 74 73 82   Resp: 17 18    Temp: (!) 97.5 F (36.4 C) 97.8 F (36.6 C) (!) 96.3 F (35.7 C) 97.9 F (36.6 C)  TempSrc: Oral Axillary Axillary Oral  SpO2: 95% 95%    Weight:      Height:        Intake/Output Summary (Last 24 hours) at  08/04/2019 1604 Last data filed at 08/04/2019 1400 Gross per 24 hour  Intake 883.16 ml  Output 0 ml  Net 883.16 ml   Filed Weights   07/31/19 0700 07/31/19 1903 08/03/19 0405  Weight: 81.6 kg 77.2 kg 73.1 kg     Physical Examination:    General: Appears in no acute distress  Cardiovascular: S1-S2, regular, no murmur auscultated  Respiratory: Clear to auscultation bilaterally  Abdomen: Abdomen is soft, nontender, no organomegaly  Extremities: No edema in the lower extremities  Neurologic: Alert, oriented x3, no focal deficit noted     Data Reviewed: I have personally reviewed following labs and imaging studies   Recent Results (from  the past 240 hour(s))  Blood Culture (routine x 2)     Status: None   Collection Time: 07/30/19  6:43 PM   Specimen: BLOOD  Result Value Ref Range Status   Specimen Description BLOOD SITE NOT SPECIFIED  Final   Special Requests   Final    BOTTLES DRAWN AEROBIC AND ANAEROBIC Blood Culture results may not be optimal due to an inadequate volume of blood received in culture bottles   Culture   Final    NO GROWTH 5 DAYS Performed at [facility], [place] [address], [place], [place] [address]    Report Status 08/04/2019 FINAL  Final  MRSA PCR Screening     Status: None   Collection Time: 08/02/19  8:43 AM   Specimen: Nasopharyngeal  Result Value Ref Range Status   MRSA by PCR NEGATIVE NEGATIVE Final    Comment:        The GeneXpert MRSA Assay (FDA approved for NASAL specimens only), is one component of a comprehensive MRSA colonization surveillance program. It is not intended to diagnose MRSA infection nor to guide or monitor treatment for MRSA infections. Performed at [facility], [place] [address] [place], [place] [address]      Liver Function Tests: Recent Labs  Lab 07/30/19 1843 08/01/19 0340 08/02/19 0311 08/03/19 0500 08/04/19 0500  AST 26 17 20  12* 11*  ALT 20 17 20 17 15   ALKPHOS 43 39 42 44 52   BILITOT 0.8 0.4 0.7 0.4 0.3  PROT 6.8 6.2* 6.4* 6.3* 5.5*  ALBUMIN 2.7* 2.4* 2.6* 2.5* 2.4*   No results for input(s): LIPASE, AMYLASE in the last 168 hours. No results for input(s): AMMONIA in the last 168 hours.  Cardiac Enzymes: No results for input(s): CKTOTAL, CKMB, CKMBINDEX, TROPONINI in the last 168 hours. BNP (last 3 results) Recent Labs    07/30/19 1843  BNP 797.5*      Admission status: Inpatient: Based on patients clinical presentation and evaluation of above clinical data, I have made determination that patient meets Inpatient criteria at this time.   Oswald Hillock   [facility] If 7PM-7AM, please contact night-coverage at [email or web address], Office  [phone]  password [email or web address]  08/04/2019, 4:04 PM  LOS: 5 days

## 2019-08-04 NOTE — Progress Notes (Signed)
ANTICOAGULATION CONSULT NOTE  Pharmacy Consult for Heparin and Warfarin Indication: h/o LV thrombus and new CVA   No Known Allergies  Patient Measurements: Height: 5\' 11"  (180.3 cm) Weight: 161 lb 2.5 oz (73.1 kg) IBW/kg (Calculated) : 75.3  Vital Signs: Temp: 97.9 F (36.6 C) (12/27 1200) Temp Source: Oral (12/27 1200) BP: 135/54 (12/27 1159) Pulse Rate: 82 (12/27 1159)  Labs: Recent Labs    08/02/19 0311 08/02/19 1315 08/03/19 0500 08/03/19 1143 08/03/19 2132 08/04/19 0500 08/04/19 1428  HGB 9.7*  --  9.2*  --   --  8.9*  --   HCT 27.8*  --  27.5*  --   --  26.1*  --   PLT 315  --  322  --   --  341  --   APTT  --  107*  --   --  77* 48* 69*  LABPROT  --  18.2* 16.8*  --   --  17.5*  --   INR  --  1.5* 1.4*  --   --  1.5*  --   HEPARINUNFRC  --  1.64* 1.14* 1.00*  --  0.60 0.65  CREATININE 7.07*  --  9.62*  --   --  11.11*  --     Estimated Creatinine Clearance: 6.8 mL/min (A) (by C-G formula based on SCr of 11.11 mg/dL (H)).   Assessment: 68 y.o. 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 Last Eliquis dose ~1000 on 12/22. Patient is currently on heparin drip at 700 units/hr for new CVA (MRI confirmed).   Given acute CVA and risk for hemorrhagic conversion, will set aPTT and heparin level at lower end of goal as listed below. Baseline heparin level before starting infusion was >2.20, indicating that apixaban is still influencing heparin levels at this point. Due to recent [place], will monitor anticoagulation using aPTT initially  Pharmacy has now been consulted to start bridge from heparin IV infusion to warfarin in this patient.   APTT came back therapeutic at 69, on 750 units/hr. Heparin level only slightly falsely elevated at 0.65. INR came back sub-therapuetic at 1.5. Per [facility] report patient/family refused warfarin last nights dose. CBC stable this morning. No s/sx of bleeding. RN reports no pauses in heparin  infusion.   Goal of Therapy:  aPTT 66-85s  Heparin level 0.3-0.5 units/ml INR goal 2 - 3 Monitor platelets by anticoagulation protocol: Yes   Plan:  Continue heparin infusion at 750 units/hr Will confirm repeat aPTT/Heparin level with morning labs  Re-dose Warfarin 5 mg PO x1 dose   - Obtain daily INR   - [place] for signs/symptoms of bleeding   Continue both heparin infusion and Warfarin until INR is therapuetic   Thanks for allowing pharmacy to be a part of this patient's care.  Antonietta Jewel, PharmD, BCCCP Clinical Pharmacist  Phone: [phone]  Please check AMION for all [place] phone numbers After 10:00 PM, call [place] [phone]

## 2019-08-04 NOTE — Progress Notes (Signed)
Patient back on unit via dialysis transport. Alert/orientedx3. 94% on 2L O2. No s/s distress noted. CBG 121. Resting comfortably. Patient has no complaints at this time. Dinner tray at bedside but patient does not want to eat. Snacks offered/declined. Will continue to monitor. CBG q4 hours.

## 2019-08-04 NOTE — Progress Notes (Signed)
[facility] Progress Note   Subjective:  5 beat run VTach overnight per notes. NSR this am. Remains on 2L [place]. For dialysis today.   Objective Vitals:   08/04/19 0040 08/04/19 0326 08/04/19 0332 08/04/19 0830  BP: (!) 93/57  103/64   Pulse: 76 72 74 73  Resp: 19 17 17 18   Temp: 98 F (36.7 C)  (!) 97.5 F (36.4 C) (!) 96.6 F (35.9 C)  TempSrc: Oral  Oral Axillary  SpO2: 96% 99% 95%   Weight:      Height:        Weight change:    Additional Objective Labs: Basic Metabolic Panel: Recent Labs  Lab 08/02/19 0311 08/03/19 0500 08/04/19 0500  NA 138 139 135  K 3.9 4.2 4.6  CL 93* 95* 94*  CO2 25 26 23   GLUCOSE 170* 151* 152*  BUN 52* 84* 106*  CREATININE 7.07* 9.62* 11.11*  CALCIUM 7.5* 7.5* 7.7*   CBC: Recent Labs  Lab 07/30/19 1843 08/01/19 1212 08/02/19 0311 08/03/19 0500 08/04/19 0500  WBC 6.8 7.0 9.0 9.9 10.1  NEUTROABS 5.6  --   --   --   --   HGB 9.6* 10.2* 9.7* 9.2* 8.9*  HCT 29.3* 30.9* 27.8* 27.5* 26.1*  MCV 103.2* 103.7* 98.9 101.5* 100.0  PLT 251 361 315 322 341   Blood Culture    Component Value Date/Time   SDES BLOOD SITE NOT SPECIFIED 07/30/2019 1843   SPECREQUEST  07/30/2019 1843    BOTTLES DRAWN AEROBIC AND ANAEROBIC Blood Culture results may not be optimal due to an inadequate volume of blood received in culture bottles   CULT  07/30/2019 1843    NO GROWTH 5 DAYS Performed at [facility], [place] [address] [place], [place] [address]    REPTSTATUS 08/04/2019 FINAL 07/30/2019 1843     Medications: . sodium chloride    . sodium chloride    . heparin 750 Units/hr (08/04/19 0843)   .  stroke: mapping our early stages of recovery book   Does not apply Once  . allopurinol  100 mg Oral Daily  . aspirin  81 mg Oral Daily  . atorvastatin  40 mg Oral Daily  . calcium acetate  1,334 mg Oral TID WC  . Chlorhexidine Gluconate Cloth  6 each Topical Q0600  . [START ON 08/07/2019] darbepoetin (ARANESP) injection -  DIALYSIS  100 mcg Intravenous Q Wed-HD  . dexamethasone (DECADRON) injection  6 mg Intravenous Q24H  . insulin aspart  0-6 Units Subcutaneous Q4H  . metoCLOPramide  5 mg Oral TID WC  . metoprolol succinate  50 mg Oral Daily  . sevelamer carbonate  2.4 g Oral TID WC  . sodium chloride flush  3 mL Intravenous Q12H  . warfarin  5 mg Oral ONCE-1800  . Warfarin - Pharmacist Dosing Inpatient   Does not apply q1800   Physical Exam Due to the nature of this patient's [id number] with isolation and in keeping with efforts to prevent the spread of infection and to conserve personal protective equipment, a physical exam was not personally performed.  A chart review of other providers notes and the patient's lab work as well as review of other pertinent studies was performed.      Dialysis Orders:  Currently GO TTS2 - previously [facility] 4.25 hr 400/800 EDW 80 2K 2 Ca - leaving below edw - last post wt 77.3 AVF heparin 8000 venofer 50/week parsabiv 7.5 - no calcitriol Mircera 75 -  last 12/17    Assessment/Plan: 1. COVID-19 PNA - Remdesivir/Decadron per primary.  2. ESRD - HD TTS. Next HD 12/27 per holiday schedule.  3. HTN/volume-  BP low/stable. On metoprolol 50. UF 1-1.5L on HD as tolerated.  4. Anemia-  Hgb 8-9s.  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 On PhosLo + Renvela binder. Check Phos with next HD.  6. Nutrition - DYS 1 diet per SLP.  7. 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 Heparin/warfarin bridge per pharmacy. Will need SNF at discharge.   Lynnda Child PA-C [facility] Pager [phone] 08/04/2019,8:48 AM  LOS: 5 days

## 2019-08-04 NOTE — Progress Notes (Signed)
ANTICOAGULATION CONSULT NOTE  Pharmacy Consult for Heparin and Warfarin Indication: h/o LV thrombus and new CVA   No Known Allergies  Patient Measurements: Height: 5\' 11"  (180.3 cm) Weight: 161 lb 2.5 oz (73.1 kg) IBW/kg (Calculated) : 75.3  Vital Signs: Temp: 96.6 F (35.9 C) (12/27 0830) Temp Source: Axillary (12/27 0830) BP: 103/64 (12/27 0332) Pulse Rate: 73 (12/27 0830)  Labs: Recent Labs    08/02/19 0311 08/02/19 1315 08/03/19 0500 08/03/19 1143 08/03/19 1514 08/03/19 2132 08/04/19 0500  HGB 9.7*  --  9.2*  --   --   --  8.9*  HCT 27.8*  --  27.5*  --   --   --  26.1*  PLT 315  --  322  --   --   --  341  APTT  --  107*  --   --  81* 77* 48*  LABPROT  --  18.2* 16.8*  --   --   --  17.5*  INR  --  1.5* 1.4*  --   --   --  1.5*  HEPARINUNFRC  --  1.64* 1.14* 1.00*  --   --  0.60  CREATININE 7.07*  --  9.62*  --   --   --  11.11*    Estimated Creatinine Clearance: 6.8 mL/min (A) (by C-G formula based on SCr of 11.11 mg/dL (H)).   Assessment: 67 y.o. 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 Last Eliquis dose ~1000 on 12/22. Patient is currently on heparin drip at 700 units/hr for new CVA (MRI confirmed).   Given acute CVA and risk for hemorrhagic conversion, will set aPTT and heparin level at lower end of goal as listed below. Baseline heparin level before starting infusion was >2.20, indicating that apixaban is still influencing heparin levels at this point. Due to recent [place], will monitor anticoagulation using aPTT initially  Pharmacy has now been consulted to start bridge from heparin IV infusion to warfarin in this patient.   APTT came back sub-therapeutic at 48, on 700 units/hr. Heparin level still falsely elevated at 0.60 (not correlating). INR came back sub-therapuetic at 1.5. Per [facility] report patient/family refused warfarin last nights dose. CBC stable this morning. No s/sx of bleeding. RN reports no  pauses in heparin infusion.   Goal of Therapy:  aPTT 66-85s  Heparin level 0.3-0.5 units/ml INR goal 2 - 3 Monitor platelets by anticoagulation protocol: Yes   Plan:  Slightly increase heparin infusion to 750 units/hr Will confirm repeat aPTT/Heparin level in 6 hrs.  Re-dose Warfarin 5 mg PO x1 dose   - Obtain daily INR   - [place] for signs/symptoms of bleeding   Continue both heparin infusion and Warfarin until INR is therapuetic   Thanks for allowing pharmacy to be a part of this patient's care.  Acey Lav, PharmD  PGY1 Acute Care Pharmacy Resident [phone]  Please check AMION for all [place] phone numbers After 10:00 PM, call [place] [phone]

## 2019-08-04 NOTE — Plan of Care (Signed)
  Problem: Education: Goal: Knowledge of risk factors and measures for prevention of condition will improve Outcome: Not Progressing   Problem: Coping: Goal: Psychosocial and spiritual needs will be supported Outcome: Not Progressing   Problem: Education: Goal: Knowledge of disease or condition will improve Outcome: Not Progressing Goal: Knowledge of secondary prevention will improve Outcome: Not Progressing Goal: Knowledge of patient specific risk factors addressed and post discharge goals established will improve Outcome: Not Progressing Goal: Individualized Educational Video(s) Outcome: Not Progressing   Problem: Health Behavior/Discharge Planning: Goal: Ability to manage health-related needs will improve Outcome: Not Progressing   Problem: Self-Care: Goal: Ability to participate in self-care as condition permits will improve Outcome: Not Progressing Goal: Verbalization of feelings and concerns over difficulty with self-care will improve Outcome: Not Progressing Goal: Ability to communicate needs accurately will improve Outcome: Not Progressing   Problem: Nutrition: Goal: Risk of aspiration will decrease Outcome: Not Progressing Goal: Dietary intake will improve Outcome: Not Progressing   Problem: Ischemic Stroke/TIA Tissue Perfusion: Goal: Complications of ischemic stroke/TIA will be minimized Outcome: Not Progressing

## 2019-08-05 ENCOUNTER — Encounter (HOSPITAL_COMMUNITY): Payer: Self-pay | Admitting: Internal Medicine

## 2019-08-05 LAB — HEPARIN LEVEL (UNFRACTIONATED)
Heparin Unfractionated: 0.42 IU/mL (ref 0.30–0.70)
Heparin Unfractionated: 0.46 IU/mL (ref 0.30–0.70)

## 2019-08-05 LAB — PATHOLOGIST SMEAR REVIEW

## 2019-08-05 LAB — APTT
aPTT: 49 seconds — ABNORMAL HIGH (ref 24–36)
aPTT: 73 seconds — ABNORMAL HIGH (ref 24–36)

## 2019-08-05 LAB — PROTIME-INR
INR: 1.4 — ABNORMAL HIGH (ref 0.8–1.2)
Prothrombin Time: 16.7 seconds — ABNORMAL HIGH (ref 11.4–15.2)

## 2019-08-05 LAB — GLUCOSE, CAPILLARY
Glucose-Capillary: 161 mg/dL — ABNORMAL HIGH (ref 70–99)
Glucose-Capillary: 182 mg/dL — ABNORMAL HIGH (ref 70–99)
Glucose-Capillary: 178 mg/dL — ABNORMAL HIGH (ref 70–99)
Glucose-Capillary: 166 mg/dL — ABNORMAL HIGH (ref 70–99)
Glucose-Capillary: 158 mg/dL — ABNORMAL HIGH (ref 70–99)
Glucose-Capillary: 185 mg/dL — ABNORMAL HIGH (ref 70–99)

## 2019-08-05 LAB — CBC
HCT: 28 % — ABNORMAL LOW (ref 39.0–52.0)
Hemoglobin: 9.6 g/dL — ABNORMAL LOW (ref 13.0–17.0)
MCH: 34.3 pg — ABNORMAL HIGH (ref 26.0–34.0)
MCHC: 34.3 g/dL (ref 30.0–36.0)
MCV: 100 fL (ref 80.0–100.0)
Platelets: 287 10*3/uL (ref 150–400)
RBC: 2.8 MIL/uL — ABNORMAL LOW (ref 4.22–5.81)
RDW: 15 % (ref 11.5–15.5)
WBC: 8.6 10*3/uL (ref 4.0–10.5)
nRBC: 4.2 % — ABNORMAL HIGH (ref 0.0–0.2)

## 2019-08-05 MED ORDER — CHLORHEXIDINE GLUCONATE CLOTH 2 % EX PADS: 6.0000 | MEDICATED_PAD | Freq: Every day | CUTANEOUS | Status: DC

## 2019-08-05 MED ORDER — WARFARIN SODIUM 5 MG PO TABS
5.0000 mg | ORAL_TABLET | Freq: Once | ORAL | Status: AC
  Administered 2019-08-05: 5 mg via ORAL
  Filled 2019-08-05: qty 1

## 2019-08-05 NOTE — Progress Notes (Signed)
[facility]  PROGRESS NOTE  Jonathan Porcher Sr. [id number] DOB: 09/29/1950 DOA: 07/30/2019 PCP: Marton Redwood, MD   Brief HPI:   69 year old male with history of ESRD on hemodialysis, Tuesday Thursday and Saturday history of renal transplant, history of stroke, hypertension, type 2 diabetes mellitus, left ventricular thrombus came to ED with difficulty swallowing.  His wife noticed him having difficulty swallowing for about 2 weeks.  He was tested positive for COVID-19 on December 16.  Chest x-ray showed right upper and right lower lobe, left lower lobe interstitial infiltrate. Patient was noted with diagnosis of acute hypoxic respiratory failure due to SARS COVID-19 viral pneumonia. Further work-up with brain MRI showed small acute infarct in the left pons, left occipital lobe and bilateral frontal lobes.  Initially failed swallow evaluation but fortunately swallowing improved, patient placed on dysphagia 1 diet with aspiration precautions.  Oral anticoagulation was resumed after discussion question with Dr. Leonie Man from neurology.    Subjective   Patient seen and examined, denies any complaints.   Assessment/Plan:     1. Acute hypoxic respiratory failure due to SARS COVID-19 viral pneumonia-patient is requiring 2 L/min of oxygen via nasal cannula, O2 sats 96%, respiratory rate 19/min.  Patient has completed 5 days of remdesivir treatment.  Currently on Decadron 6 mg IV every 24 hours.  COVID-19 Labs  Recent Labs    08/03/19 0500 08/04/19 0500  DDIMER 0.60* 0.61*  FERRITIN 3,426* 2,745*  CRP 5.6* 3.8*   Inflammatory markers are improving, CRP down to 3.8, ferritin slowly improving.  2.  Acute CVA-possible embolic phenomena, small acute infarct in left pons, left occipital lobe and bilateral frontal lobes.  Multiple chronic infarcts.  LDL 52.  Echocardiogram showed EF 30 to 35%, severe apical dyskinesis, apical left ventricular aneurysm.  Patient has no focal deficit at this  time.  Started on anticoagulation with warfarin with heparin bridging.  Target INR is 2-3.  Continue aspirin and statin.  3.  Hypertension-blood pressure stable, continue metoprolol succinate 50 mg daily.  4.  Uncontrolled type 2 diabetes mellitus-continue sliding scale insulin with NovoLog.  5. ESRD on hemodialysis/anemia of chronic disease/failed renal transplant-continue hemodialysis per nephrology.  Continue PhosLo, Renvela, Aranesp.  6. LV thrombus-continue anticoagulation with warfarin/heparin bridge   CBG: Recent Labs  Lab 08/04/19 2115 08/05/19 0115 08/05/19 0415 08/05/19 0754 08/05/19 1150  GLUCAP 121* 161* 182* 178* 166*    CBC: Recent Labs  Lab 07/30/19 1843 08/01/19 1212 08/02/19 0311 08/03/19 0500 08/04/19 0500 08/05/19 0500  WBC 6.8 7.0 9.0 9.9 10.1 8.6  NEUTROABS 5.6  --   --   --   --   --   HGB 9.6* 10.2* 9.7* 9.2* 8.9* 9.6*  HCT 29.3* 30.9* 27.8* 27.5* 26.1* 28.0*  MCV 103.2* 103.7* 98.9 101.5* 100.0 100.0  PLT 251 361 315 322 341 [id number]    Basic Metabolic Panel: Recent Labs  Lab 07/30/19 1843 08/01/19 0340 08/02/19 0311 08/03/19 0500 08/04/19 0500  NA 137 143 138 139 135  K 3.7 4.8 3.9 4.2 4.6  CL 93* 97* 93* 95* 94*  CO2 28 24 25 26 23   GLUCOSE 144* 194* 170* 151* 152*  BUN 29* 78* 52* 84* 106*  CREATININE 7.21* 10.30* 7.07* 9.62* 11.11*  CALCIUM 7.4* 7.4* 7.5* 7.5* 7.7*       DVT prophylaxis: Warfarin  Code Status: Full code  Family Communication: No family at bedside  Disposition Plan: likely skilled nursing facility when bed becomes available.  BMI  Estimated body mass index is 23.46 kg/m as calculated from the following:   Height as of this encounter: 5\' 11"  (1.803 m).   Weight as of this encounter: 76.3 kg.  Scheduled medications:  .  stroke: mapping our early stages of recovery book   Does not apply Once  . allopurinol  100 mg Oral Daily  . aspirin  81 mg Oral Daily  . atorvastatin  40 mg Oral Daily  .  calcium acetate  1,334 mg Oral TID WC  . Chlorhexidine Gluconate Cloth  6 each Topical Q0600  . [START ON 08/07/2019] darbepoetin (ARANESP) injection - DIALYSIS  100 mcg Intravenous Q Wed-HD  . dexamethasone (DECADRON) injection  6 mg Intravenous Q24H  . insulin aspart  0-6 Units Subcutaneous Q4H  . metoCLOPramide  5 mg Oral TID WC  . metoprolol succinate  50 mg Oral Daily  . sevelamer carbonate  2.4 g Oral TID WC  . sodium chloride flush  3 mL Intravenous Q12H  . warfarin  5 mg Oral ONCE-1800  . Warfarin - Pharmacist Dosing Inpatient   Does not apply q1800       Antibiotics:   Anti-infectives (From admission, onward)   Start     Dose/Rate Route Frequency Ordered Stop   07/31/19 1000  remdesivir 100 mg in sodium chloride 0.9 % 100 mL IVPB     100 mg 200 mL/hr over 30 Minutes Intravenous Daily 07/30/19 2217 08/04/19 0748   07/31/19 0800  ceFEPIme (MAXIPIME) 1 g in sodium chloride 0.9 % 100 mL IVPB  Status:  Discontinued     1 g 200 mL/hr over 30 Minutes Intravenous Every 24 hours 07/31/19 0748 07/31/19 0816   07/31/19 0800  vancomycin (VANCOREADY) IVPB 1500 mg/300 mL  Status:  Discontinued     1,500 mg 150 mL/hr over 120 Minutes Intravenous  Once 07/31/19 0748 07/31/19 0816   07/31/19 0748  vancomycin variable dose per unstable renal function (pharmacist dosing)  Status:  Discontinued      Does not apply See admin instructions 07/31/19 0748 07/31/19 0816   07/30/19 2230  remdesivir 200 mg in sodium chloride 0.9% 250 mL IVPB     200 mg 580 mL/hr over 30 Minutes Intravenous Once 07/30/19 2217 07/31/19 0045       Objective   Vitals:   08/05/19 0422 08/05/19 0443 08/05/19 0824 08/05/19 1200  BP:  122/67 (!) 145/70 139/70  Pulse:  82 80 77  Resp:  19 19 19   Temp:   98.7 F (37.1 C) 97.7 F (36.5 C)  TempSrc:   Oral Oral  SpO2:  95% 94% 94%  Weight: 76.3 kg     Height:        Intake/Output Summary (Last 24 hours) at 08/05/2019 1402 Last data filed at 08/05/2019  1300 Gross per 24 hour  Intake 292.18 ml  Output 1002 ml  Net -709.82 ml   Filed Weights   08/04/19 1610 08/04/19 2020 08/05/19 0422  Weight: 73 kg 72 kg 76.3 kg     Physical Examination:    General-appears in no acute distress  Heart-S1-S2, regular, no murmur auscultated  Lungs-clear to auscultation bilaterally, no wheezing or crackles auscultated  Abdomen-soft, nontender, no organomegaly  Extremities-no edema in the lower extremities  Neuro-alert, oriented x3, no focal deficit noted     Data Reviewed: I have personally reviewed following labs and imaging studies   Recent Results (from the past 240 hour(s))  Blood Culture (routine x 2)  Status: None   Collection Time: 07/30/19  6:43 PM   Specimen: BLOOD  Result Value Ref Range Status   Specimen Description BLOOD SITE NOT SPECIFIED  Final   Special Requests   Final    BOTTLES DRAWN AEROBIC AND ANAEROBIC Blood Culture results may not be optimal due to an inadequate volume of blood received in culture bottles   Culture   Final    NO GROWTH 5 DAYS Performed at [facility], [place] [address] [place], [place] [address]    Report Status 08/04/2019 FINAL  Final  MRSA PCR Screening     Status: None   Collection Time: 08/02/19  8:43 AM   Specimen: Nasopharyngeal  Result Value Ref Range Status   MRSA by PCR NEGATIVE NEGATIVE Final    Comment:        The GeneXpert MRSA Assay (FDA approved for NASAL specimens only), is one component of a comprehensive MRSA colonization surveillance program. It is not intended to diagnose MRSA infection nor to guide or monitor treatment for MRSA infections. Performed at [facility], [place] [address], [place],  [address]      Liver Function Tests: Recent Labs  Lab 07/30/19 1843 08/01/19 0340 08/02/19 0311 08/03/19 0500 08/04/19 0500  AST 26 17 20  12* 11*  ALT 20 17 20 17 15   ALKPHOS 43 39 42 44 52  BILITOT 0.8 0.4 0.7 0.4 0.3  PROT 6.8 6.2* 6.4* 6.3*  5.5*  ALBUMIN 2.7* 2.4* 2.6* 2.5* 2.4*   No results for input(s): LIPASE, AMYLASE in the last 168 hours. No results for input(s): AMMONIA in the last 168 hours.  Cardiac Enzymes: No results for input(s): CKTOTAL, CKMB, CKMBINDEX, TROPONINI in the last 168 hours. BNP (last 3 results) Recent Labs    07/30/19 1843  BNP 797.5*      Admission status: Inpatient: Based on patients clinical presentation and evaluation of above clinical data, I have made determination that patient meets Inpatient criteria at this time.   Oswald Hillock   [facility] If 7PM-7AM, please contact night-coverage at [email or web address], Office  [phone]  password [place]  08/05/2019, 2:02 PM  LOS: 6 days

## 2019-08-05 NOTE — Progress Notes (Signed)
  [facility] Progress Note   Subjective: pt seen in room, nasal O2, not in distress  Objective Vitals:   08/05/19 0419 08/05/19 0422 08/05/19 0443 08/05/19 0824  BP:   122/67 (!) 145/70  Pulse:   82 80  Resp:   19 19  Temp: 98.4 F (36.9 C)   98.7 F (37.1 C)  TempSrc: Oral   Oral  SpO2:   95% 94%  Weight:  76.3 kg    Height:        Physical Exam  nasal O2, no distress   No jvd  chest no rales or rhonchi   Cor reg no RG   Abd soft ntnd    RUA AVF placed 10.1.20   Alert, confused   Dialysis OP: Currently GO TTS2 - previously [facility]  4h 61min  400/800   80kg  2/2 bath R AVF  Hep 8000 venofer 50/week + Mircera 75 last 12/17  parsabiv 7.5, no calcitriol      Assessment/Plan: 1. COVID-19 PNA - Remdesivir/Decadron per primary.  2. ESRD - HD TTS. Had HD yest, next HD tomorrow.  3. HTN/volume-  BP low/stable. On metoprolol 50. UF 1-1.5L on HD as tolerated.  4. Anemia-  Hgb 8-9s.  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 On PhosLo + Renvela binder. Check Phos with next HD.  6. Nutrition - DYS 1 diet per SLP.  7. Acute embolic CVA - multiple acute infarcts/prior LV thrombus/ hx old strokes by imaging - Stroke team signed off 12/25. Heparin/warfarin bridge per pharmacy. Will need SNF at discharge.   Kelly Splinter, MD 08/05/2019, 12:00 PM    Medications: . sodium chloride    . sodium chloride    . heparin 850 Units/hr (08/05/19 0819)   .  stroke: mapping our early stages of recovery book   Does not apply Once  . allopurinol  100 mg Oral Daily  . aspirin  81 mg Oral Daily  . atorvastatin  40 mg Oral Daily  . calcium acetate  1,334 mg Oral TID WC  . Chlorhexidine Gluconate Cloth  6 each Topical Q0600  . [START ON 08/07/2019] darbepoetin (ARANESP) injection - DIALYSIS  100 mcg Intravenous Q Wed-HD  . dexamethasone (DECADRON) injection  6 mg Intravenous Q24H  . insulin aspart  0-6 Units Subcutaneous Q4H  . metoCLOPramide   5 mg Oral TID WC  . metoprolol succinate  50 mg Oral Daily  . sevelamer carbonate  2.4 g Oral TID WC  . sodium chloride flush  3 mL Intravenous Q12H  . warfarin  5 mg Oral ONCE-1800  . Warfarin - Pharmacist Dosing Inpatient   Does not apply [phone]

## 2019-08-05 NOTE — TOC Progression Note (Signed)
Transition of Care (TOC) - Progression Note    Patient Details  Name: Jonathan [name]. MRN: [id number] Date of Birth: [birth year]  Transition of Care [facility]) CM/SW [place], LCSW Phone Number: 08/05/2019, 1:24 PM  Clinical Narrative:    No SNF bed offers at this time due to Dialysis transportation needs. CSW to continue to follow.    Expected Discharge Plan: [place]    Expected Discharge Plan and Services Expected Discharge Plan: [place] arrangements for the past 2 months: Single Family Home                                       Social Determinants of Health (SDOH) Interventions    Readmission Risk Interventions No flowsheet data found.

## 2019-08-05 NOTE — Progress Notes (Signed)
ANTICOAGULATION CONSULT NOTE  Pharmacy Consult for Heparin and Warfarin Indication: h/o LV thrombus and new CVA   No Known Allergies  Patient Measurements: Height: 5\' 11"  (180.3 cm) Weight: 168 lb 3.4 oz (76.3 kg) IBW/kg (Calculated) : 75.3  Vital Signs: Temp: 98 F (36.7 C) (12/28 1617) Temp Source: Oral (12/28 1617) BP: 131/71 (12/28 1617) Pulse Rate: 81 (12/28 1617)  Labs: Recent Labs    08/03/19 0500 08/03/19 0538 08/04/19 0500 08/04/19 1428 08/05/19 0500 08/05/19 1540  HGB 9.2*  --  8.9*  --  9.6*  --   HCT 27.5*  --  26.1*  --  28.0*  --   PLT 322  --  341  --  287  --   APTT  --   --  48* 69* 49* 73*  LABPROT 16.8*  --  17.5*  --  16.7*  --   INR 1.4*  --  1.5*  --  1.4*  --   HEPARINUNFRC 1.14*   < > 0.60 0.65 0.42 0.46  CREATININE 9.62*  --  11.11*  --   --   --    < > = values in this interval not displayed.    Estimated Creatinine Clearance: 7 mL/min (A) (by C-G formula based on SCr of 11.11 mg/dL (H)).   Assessment: 68 y.o. 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 Last Eliquis dose ~1000 on 12/22. Patient is currently on heparin drip at 700 units/hr for new CVA (MRI confirmed).   Given acute CVA and risk for hemorrhagic conversion, will set aPTT and heparin level at lower end of goal as listed below. Baseline heparin level before starting infusion was >2.20, indicating that apixaban is still influencing heparin levels at this point. Due to recent [place], will monitor anticoagulation using aPTT initially  Pharmacy has now been consulted to start bridge from heparin IV infusion to warfarin in this patient.   APTT came back therapeutic at 73, heparin level also therapeutic at 0.46, on 850 units/hr. No s/sx of bleeding or infusion issues.   INR subtherapeutic at 1.4, note warfarin dose refused on 12/26, dose administered 12/27.  Goal of Therapy:  aPTT 66-85s  Heparin level 0.3-0.5 units/ml INR goal  2 - 3 Monitor platelets by anticoagulation protocol: Yes   Plan:  Continue heparin gtt to 850 units/hr Confirm labs with AM labs Warfarin 5mg  PO x 1 tonight Daily INR, s/s bleeding  Antonietta Jewel, PharmD, BCCCP Clinical Pharmacist  Phone: [phone]  Please check AMION for all [place] phone numbers After 10:00 PM, call [place] [phone] 08/05/2019 4:39 PM

## 2019-08-05 NOTE — Progress Notes (Signed)
Occupational Therapy Treatment Patient Details Name: Jonathan Demore Sr. MRN: [id number] DOB: Jun 09, 1951 Today's Date: 08/05/2019    History of present illness 68 y.o. 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 Diagnosed with COVID 6 days prior to coming to ED, where chest x-ray showed multifocal pneumonia. Admitted 07/30/19 for treatment of acute encephalopathy, difficulty swallowing and pneumonia.MRI of the brain showed small acute infarcts in the left pons, left occipital lob and right frontal lobe in addition to chronic infarcts    OT comments  Pt progressing toward stated goals. Continues to be limited by cognitive deficits impacting ability to process and safety complete BADL. Pt responding most successfully to single step, yes/no commands. He needs increased processing time. Pt also presents with difficulty with emotional regulation, occasionally yelling out of context or getting upset without much reason. Pt completed bed mobility with mod A and sit <> stand in stedy with mod-max A +2 to recliner. When asked month pt stated "may" and when asked place pt stated "[place]". Unsure if word finding difficulty is at play, for pt responses are close and is accurate with yes/no questioning. Will continue to assess cognition. D/c remains appropriate for SNF. Will continue to follow.   Follow Up Recommendations  SNF;Supervision/Assistance - 24 hour    Equipment Recommendations  None recommended by OT    Recommendations for Other Services      Precautions / Restrictions Precautions Precautions: Fall Restrictions Weight Bearing Restrictions: No       Mobility Bed Mobility Overal bed mobility: Needs Assistance Bed Mobility: Supine to Sit     Supine to sit: HOB elevated;Mod assist     General bed  mobility comments: requires modA to pull against therapist to come to EoB, requires min A to bring trunk up off L arm and for scooting hips to EoB  Transfers Overall transfer level: Needs assistance Equipment used: Rolling walker (2 wheeled) Transfers: Sit to/from [facility] Sit to Stand: Mod assist;+2 physical assistance;+2 safety/equipment;Max assist Stand pivot transfers: Mod assist;+2 safety/equipment       General transfer comment: sit>stand from bed modA for power up to Stedy, max vc and assist to bring arms to bar to pull up, maxAx2 for power up from lower recliner surface     Balance Overall balance assessment: Needs assistance Sitting-balance support: Feet supported Sitting balance-Leahy Scale: Fair Sitting balance - Comments: pt able to maintain EOB sitting while donning socks with close min guard assist    Standing balance support: Bilateral upper extremity supported Standing balance-Leahy Scale: Poor Standing balance comment: requires UE support on Stedy to maintain balance                           ADL either performed or assessed with clinical judgement   ADL Overall ADL's : Needs assistance/impaired                         Toilet Transfer: Moderate assistance;+2 for physical assistance;Stand-pivot Toilet Transfer Details (indicate cue type and reason): simulated to recliner with use of stedy Toileting- Clothing Manipulation and Hygiene: Maximal assistance;Sit to/from stand Toileting - Clothing Manipulation Details (indicate cue type and reason): to clean peri area after incontinent with BM       General ADL Comments: pt continues to be  limited by congitive deficits in processing and safety. Stedy used this session for BADL mobility progression     Vision       Perception     Praxis      Cognition Arousal/Alertness: Awake/alert Behavior During Therapy: Flat affect Overall Cognitive Status: Impaired/Different from  baseline Area of Impairment: Orientation;Memory;Safety/judgement;Awareness;Following commands;Problem solving;Attention                 Orientation Level: Disoriented to;Time;Place;Situation Current Attention Level: Sustained Memory: Decreased short-term memory Following Commands: Follows one step commands with increased time;Follows one step commands inconsistently Safety/Judgement: Decreased awareness of safety;Decreased awareness of deficits Awareness: Intellectual Problem Solving: Slow processing;Decreased initiation;Difficulty sequencing;Requires verbal cues;Requires tactile cues General Comments: suspect possible word finding difficulty due to inappropriateness with answers. Increased time and cueing needed for completion of tasks. Mostly only succecssfully responds to Y/N commands        Exercises     Shoulder Instructions       General Comments Pt on 2L O2 via [place], with SaO2 >90%, lost pleth waveform with holding onto Stedy bar for transfer, once back in seated SaO2 94%O2    Pertinent Vitals/ Pain       Pain Assessment: No/denies pain Faces Pain Scale: No hurt  Home Living                                          Prior Functioning/Environment              Frequency  Min 2X/week        Progress Toward Goals  OT Goals(current goals can now be found in the care plan section)  Progress towards OT goals: Progressing toward goals  Acute Rehab OT Goals Patient Stated Goal: Pt unable to state  OT Goal Formulation: Patient unable to participate in goal setting Time For Goal Achievement: 08/15/19 Potential to Achieve Goals: Good  Plan Discharge plan remains appropriate;Frequency remains appropriate    Co-evaluation    PT/OT/SLP Co-Evaluation/Treatment: Yes Reason for Co-Treatment: For patient/therapist safety;Complexity of the patient's impairments (multi-system involvement) PT goals addressed during session: Mobility/safety with  mobility OT goals addressed during session: ADL's and self-care      AM-PAC OT "6 Clicks" Daily Activity     Outcome Measure   Help from another person eating meals?: A Little Help from another person taking care of personal grooming?: A Little Help from another person toileting, which includes using toliet, bedpan, or urinal?: A Lot Help from another person bathing (including washing, rinsing, drying)?: A Lot Help from another person to put on and taking off regular upper body clothing?: A Lot Help from another person to put on and taking off regular lower body clothing?: A Lot 6 Click Score: 14    End of Session Equipment Utilized During Treatment: Gait belt;Rolling walker  OT Visit Diagnosis: Unsteadiness on feet (R26.81);Other symptoms and signs involving cognitive function;Other abnormalities of gait and mobility (R26.89)   Activity Tolerance Patient tolerated treatment well   Patient Left in chair;with call bell/phone within reach;with chair alarm set   Nurse Communication Mobility status        Time: [id number] OT Time Calculation (min): 29 min  Charges: OT General Charges $OT Visit: 1 Visit OT Treatments $Self Care/Home Management : 8-22 mins  Zenovia Jarred, MSOT, OTR/L [place] [facility] Office: [phone]  Zenovia Jarred 08/05/2019, 6:05  PM

## 2019-08-05 NOTE — Progress Notes (Signed)
Physical Therapy Treatment Patient Details Name: Jonathan Zephier Sr. MRN: [id number] DOB: 26-Feb-1951 Today's Date: 08/05/2019    History of Present Illness 68 y.o. 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 Diagnosed with COVID 6 days prior to coming to ED, where chest x-ray showed multifocal pneumonia. Admitted 07/30/19 for treatment of acute encephalopathy, difficulty swallowing and pneumonia.MRI of the brain showed small acute infarcts in the left pons, left occipital lob and right frontal lobe in addition to chronic infarcts     PT Comments    Pt agreeable to getting up to chair with therapy. Pt continues to have cognition deficits, unable to follow anything other than 1 step commands, and difficulty with answering questions correctly, like name the month and he would say "May" or where are we and he would say "[place]". Pt also with difficulty regulating his response to assistance, initially saying "no" and then a minute later allowing assist. Pt requires modA for bed mobility, and modAx2 for sit>stand from elevated surfaces and maxAx2 for sit>stand from normal height furniture. D/c plans remain appropriate at this time. PT will continue to follow acutely.    Follow Up Recommendations  SNF     Equipment Recommendations  None recommended by PT       Precautions / Restrictions Precautions Precautions: Fall Restrictions Weight Bearing Restrictions: No    Mobility  Bed Mobility Overal bed mobility: Needs Assistance Bed Mobility: Supine to Sit     Supine to sit: HOB elevated;Mod assist     General bed mobility comments: requires modA to pull against therapist to come to EoB, requires min A to bring trunk up off L arm and for scooting hips to EoB  Transfers Overall transfer level: Needs  assistance Equipment used: Rolling walker (2 wheeled) Transfers: Sit to/from [facility] Sit to Stand: Mod assist;+2 physical assistance;+2 safety/equipment;Max assist         General transfer comment: sit>stand from bed modA for power up to Stedy, max vc and assist to bring arms to bar to pull up, maxAx2 for power up from lower recliner surface    Modified Rankin (Stroke Patients Only) Modified Rankin (Stroke Patients Only) Pre-Morbid Rankin Score: Moderate disability Modified Rankin: Moderately severe disability     Balance Overall balance assessment: Needs assistance Sitting-balance support: Feet supported Sitting balance-Leahy Scale: Fair     Standing balance support: Bilateral upper extremity supported Standing balance-Leahy Scale: Poor Standing balance comment: requires UE support on Stedy to maintain balance                            Cognition Arousal/Alertness: Awake/alert Behavior During Therapy: Flat affect Overall Cognitive Status: Impaired/Different from baseline Area of Impairment: Orientation;Memory;Safety/judgement;Awareness;Following commands;Problem solving;Attention                 Orientation Level: Disoriented to;Time;Place;Situation Current Attention Level: Sustained Memory: Decreased short-term memory Following Commands: Follows one step commands consistently;Follows one step commands with increased time Safety/Judgement: Decreased awareness of safety;Decreased awareness of deficits Awareness: (no awareness of why he is in hospital) Problem Solving: Slow processing;Decreased initiation;Difficulty sequencing;Requires verbal cues;Requires tactile cues General Comments: Possible aphasia, difficult with appropriateness of answers, able to state a month name not correct month, able to state a place not the right place      Exercises  General Comments General comments (skin integrity, edema, etc.): Pt on 2L O2 via  [place], with SaO2 >90%, lost pleth waveform with holding onto Stedy bar for transfer, once back in seated SaO2 94%O2      Pertinent Vitals/Pain Pain Assessment: No/denies pain Faces Pain Scale: No hurt           PT Goals (current goals can now be found in the care plan section) Acute Rehab PT Goals Patient Stated Goal: Pt unable to state  PT Goal Formulation: Patient unable to participate in goal setting Potential to Achieve Goals: Good Progress towards PT goals: Progressing toward goals    Frequency    Min 2X/week      PT Plan Current plan remains appropriate    Co-evaluation PT/OT/SLP Co-Evaluation/Treatment: Yes Reason for Co-Treatment: For patient/therapist safety;Complexity of the patient's impairments (multi-system involvement) PT goals addressed during session: Mobility/safety with mobility        AM-PAC PT "6 Clicks" Mobility   Outcome Measure  Help needed turning from your back to your side while in a flat bed without using bedrails?: A Little Help needed moving from lying on your back to sitting on the side of a flat bed without using bedrails?: Total Help needed moving to and from a bed to a chair (including a wheelchair)?: Total Help needed standing up from a chair using your arms (e.g., wheelchair or bedside chair)?: Total Help needed to walk in hospital room?: Total Help needed climbing 3-5 steps with a railing? : Total 6 Click Score: 8    End of Session Equipment Utilized During Treatment: Gait belt Activity Tolerance: Patient tolerated treatment well Patient left: in chair;with call bell/phone within reach;with chair alarm set Nurse Communication: Mobility status PT Visit Diagnosis: Unsteadiness on feet (R26.81);Other abnormalities of gait and mobility (R26.89);Muscle weakness (generalized) (M62.81);Ataxic gait (R26.0);Difficulty in walking, not elsewhere classified (R26.2)     Time: [id number] PT Time Calculation (min) (ACUTE ONLY): 38  min  Charges:  $Therapeutic Activity: 8-22 mins                     Jamiah Homeyer B. Migdalia Dk PT, DPT Acute Rehabilitation Services Pager [phone] Office [phone]    [place] 08/05/2019, 5:38 PM

## 2019-08-05 NOTE — Progress Notes (Signed)
ANTICOAGULATION CONSULT NOTE  Pharmacy Consult for Heparin and Warfarin Indication: h/o LV thrombus and new CVA   No Known Allergies  Patient Measurements: Height: 5\' 11"  (180.3 cm) Weight: 168 lb 3.4 oz (76.3 kg) IBW/kg (Calculated) : 75.3  Vital Signs: Temp: 98.4 F (36.9 C) (12/28 0419) Temp Source: Oral (12/28 0419) BP: 122/67 (12/28 0443) Pulse Rate: 82 (12/28 0443)  Labs: Recent Labs    08/03/19 0500 08/03/19 0538 08/04/19 0500 08/04/19 1428 08/05/19 0500  HGB 9.2*  --  8.9*  --  9.6*  HCT 27.5*  --  26.1*  --  28.0*  PLT 322  --  341  --  287  APTT  --   --  48* 69* 49*  LABPROT 16.8*  --  17.5*  --  16.7*  INR 1.4*  --  1.5*  --  1.4*  HEPARINUNFRC 1.14*   < > 0.60 0.65 0.42  CREATININE 9.62*  --  11.11*  --   --    < > = values in this interval not displayed.    Estimated Creatinine Clearance: 7 mL/min (A) (by C-G formula based on SCr of 11.11 mg/dL (H)).   Assessment: 68 y.o. 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 Last Eliquis dose ~1000 on 12/22. Patient is currently on heparin drip at 700 units/hr for new CVA (MRI confirmed).   Given acute CVA and risk for hemorrhagic conversion, will set aPTT and heparin level at lower end of goal as listed below. Baseline heparin level before starting infusion was >2.20, indicating that apixaban is still influencing heparin levels at this point. Due to recent [place], will monitor anticoagulation using aPTT initially  Pharmacy has now been consulted to start bridge from heparin IV infusion to warfarin in this patient.   APTT subtherapeutic at 49 sec, heparin level 0.42 downtrend however may still be falsely elevated d/t Eliquis dose, likely close to correlated.  INR subtherapeutic at 1.4, note warfarin dose refused on 12/26, dose administered 12/27.  Goal of Therapy:  aPTT 66-85s  Heparin level 0.3-0.5 units/ml INR goal 2 - 3 Monitor platelets by  anticoagulation protocol: Yes   Plan:  Increase heparin gtt to 850 units/hr F/u 8 hour aPTT/HL Warfarin 5mg  PO x 1 tonight Daily INR, s/s bleeding  Bertis Ruddy, PharmD Clinical Pharmacist Please check AMION for all [place] numbers 08/05/2019 7:54 AM

## 2019-08-05 NOTE — Plan of Care (Signed)
  Problem: Coping: Goal: Psychosocial and spiritual needs will be supported Outcome: Progressing   Problem: Education: Goal: Knowledge of secondary prevention will improve Outcome: Progressing   Problem: Health Behavior/Discharge Planning: Goal: Ability to manage health-related needs will improve Outcome: Progressing   Problem: Self-Care: Goal: Ability to participate in self-care as condition permits will improve Outcome: Progressing Goal: Verbalization of feelings and concerns over difficulty with self-care will improve Outcome: Progressing Goal: Ability to communicate needs accurately will improve Outcome: Progressing   Problem: Nutrition: Goal: Risk of aspiration will decrease Outcome: Progressing Goal: Dietary intake will improve Outcome: Progressing   Problem: Ischemic Stroke/TIA Tissue Perfusion: Goal: Complications of ischemic stroke/TIA will be minimized Outcome: Progressing

## 2019-08-06 LAB — CBC
HCT: 27.7 % — ABNORMAL LOW (ref 39.0–52.0)
Hemoglobin: 9 g/dL — ABNORMAL LOW (ref 13.0–17.0)
MCH: 33.8 pg (ref 26.0–34.0)
MCHC: 32.5 g/dL (ref 30.0–36.0)
MCV: 104.1 fL — ABNORMAL HIGH (ref 80.0–100.0)
Platelets: 292 10*3/uL (ref 150–400)
RBC: 2.66 MIL/uL — ABNORMAL LOW (ref 4.22–5.81)
RDW: 16.2 % — ABNORMAL HIGH (ref 11.5–15.5)
WBC: 9.6 10*3/uL (ref 4.0–10.5)
nRBC: 1.6 % — ABNORMAL HIGH (ref 0.0–0.2)

## 2019-08-06 LAB — BASIC METABOLIC PANEL
Anion gap: 15 (ref 5–15)
BUN: 76 mg/dL — ABNORMAL HIGH (ref 8–23)
CO2: 26 mmol/L (ref 22–32)
Calcium: 7.8 mg/dL — ABNORMAL LOW (ref 8.9–10.3)
Chloride: 97 mmol/L — ABNORMAL LOW (ref 98–111)
Creatinine, Ser: 9.25 mg/dL — ABNORMAL HIGH (ref 0.61–1.24)
GFR calc Af Amer: 6 mL/min — ABNORMAL LOW (ref >60)
GFR calc non Af Amer: 5 mL/min — ABNORMAL LOW (ref >60)
Glucose, Bld: 153 mg/dL — ABNORMAL HIGH (ref 70–99)
Potassium: 5 mmol/L (ref 3.5–5.1)
Sodium: 138 mmol/L (ref 135–145)

## 2019-08-06 LAB — GLUCOSE, CAPILLARY
Glucose-Capillary: 131 mg/dL — ABNORMAL HIGH (ref 70–99)
Glucose-Capillary: 141 mg/dL — ABNORMAL HIGH (ref 70–99)
Glucose-Capillary: 141 mg/dL — ABNORMAL HIGH (ref 70–99)
Glucose-Capillary: 160 mg/dL — ABNORMAL HIGH (ref 70–99)
Glucose-Capillary: 160 mg/dL — ABNORMAL HIGH (ref 70–99)
Glucose-Capillary: 201 mg/dL — ABNORMAL HIGH (ref 70–99)
Glucose-Capillary: 209 mg/dL — ABNORMAL HIGH (ref 70–99)

## 2019-08-06 LAB — HEPARIN LEVEL (UNFRACTIONATED): Heparin Unfractionated: 0.48 IU/mL (ref 0.30–0.70)

## 2019-08-06 LAB — PROTIME-INR
INR: 2.1 — ABNORMAL HIGH (ref 0.8–1.2)
Prothrombin Time: 23.7 seconds — ABNORMAL HIGH (ref 11.4–15.2)

## 2019-08-06 LAB — APTT: aPTT: 90 seconds — ABNORMAL HIGH (ref 24–36)

## 2019-08-06 LAB — MAGNESIUM: Magnesium: 2.4 mg/dL (ref 1.7–2.4)

## 2019-08-06 MED ORDER — WARFARIN SODIUM 2.5 MG PO TABS
2.5000 mg | ORAL_TABLET | Freq: Once | ORAL | Status: AC
  Administered 2019-08-06: 2.5 mg via ORAL
  Filled 2019-08-06: qty 1

## 2019-08-06 NOTE — Progress Notes (Signed)
Patient had a 4 beat run of vtach, asymptomatic. MD aware.

## 2019-08-06 NOTE — Plan of Care (Signed)
  Problem: Education: Goal: Knowledge of risk factors and measures for prevention of condition will improve Outcome: Not Progressing   Problem: Coping: Goal: Psychosocial and spiritual needs will be supported Outcome: Not Progressing   Problem: Education: Goal: Knowledge of disease or condition will improve Outcome: Not Progressing Goal: Knowledge of secondary prevention will improve Outcome: Not Progressing Goal: Knowledge of patient specific risk factors addressed and post discharge goals established will improve Outcome: Not Progressing   Problem: Health Behavior/Discharge Planning: Goal: Ability to manage health-related needs will improve Outcome: Not Progressing   Problem: Self-Care: Goal: Ability to participate in self-care as condition permits will improve Outcome: Not Progressing Goal: Verbalization of feelings and concerns over difficulty with self-care will improve Outcome: Not Progressing Goal: Ability to communicate needs accurately will improve Outcome: Not Progressing   Problem: Nutrition: Goal: Risk of aspiration will decrease Outcome: Not Progressing Goal: Dietary intake will improve Outcome: Not Progressing   Problem: Ischemic Stroke/TIA Tissue Perfusion: Goal: Complications of ischemic stroke/TIA will be minimized Outcome: Not Progressing

## 2019-08-06 NOTE — Progress Notes (Signed)
Patient arrived at 5N32 alert with BM on him and sheet ....changed patient. Declined to allow cannulation...state he wants be left alone. Encouraged patient to allow treatment to begin.. he declined. Alerted MD [place] and floor RN Apolonio Schneiders.

## 2019-08-06 NOTE — Progress Notes (Signed)
[place] for Heparin and Warfarin Indication: h/o LV thrombus and new CVA   No Known Allergies  Patient Measurements: Height: 5\' 11"  (180.3 cm) Weight: 168 lb 3.4 oz (76.3 kg) IBW/kg (Calculated) : 75.3  Vital Signs: Temp: 97.9 F (36.6 C) (12/28 2135) Temp Source: Oral (12/28 2135) Pulse Rate: 96 (12/28 2135)  Labs: Recent Labs    08/04/19 0500 08/05/19 0500 08/05/19 1540 08/06/19 0420  HGB 8.9* 9.6*  --  9.0*  HCT 26.1* 28.0*  --  27.7*  PLT 341 287  --  292  APTT 48* 49* 73* 90*  LABPROT 17.5* 16.7*  --  23.7*  INR 1.5* 1.4*  --  2.1*  HEPARINUNFRC 0.60 0.42 0.46 0.48  CREATININE 11.11*  --   --  9.25*    Estimated Creatinine Clearance: 8.4 mL/min (A) (by C-G formula based on SCr of 9.25 mg/dL (H)).   Assessment: 68 y.o. 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 Last Eliquis dose ~1000 on 12/22. Patient is currently on heparin drip at 700 units/hr for new CVA (MRI confirmed).   Given acute CVA and risk for hemorrhagic conversion, will set aPTT and heparin level at lower end of goal as listed below. Baseline heparin level before starting infusion was >2.20, indicating that apixaban is still influencing heparin levels at this point. Due to recent [place], will monitor anticoagulation using aPTT initially  Pharmacy has now been consulted to start bridge from heparin IV infusion to warfarin in this patient.   Heparin level therapeutic and now correlated, will follow heparin level alone.  INR now therapeutic at 2.1.  Goal of Therapy:  Heparin level 0.3-0.5 units/ml INR goal 2 - 3 Monitor platelets by anticoagulation protocol: Yes   Plan:  Continue heparin gtt at 850 units/hr Warfarin 2.5 mg PO x1 today Daily INR, heparin level, s/s bleeding F/u INR stability to d/c heparin gtt  Bertis Ruddy, PharmD Clinical Pharmacist Please check AMION for all [place]  numbers 08/06/2019 7:41 AM

## 2019-08-06 NOTE — Progress Notes (Signed)
  [facility] Progress Note   Subjective:  Patient not examined today directly given COVID-19 + status, utilizing exam of the primary team and observations of RN's.   Pt refused to let us stick his fistula today.  HD postponed, will try again tomorrow.   Objective Vitals:   08/05/19 2135 08/06/19 0753 08/06/19 0800 08/06/19 1134  BP:  130/62 124/63 133/65  Pulse: 96  73 78  Resp: (!) 24 15 13 14   Temp: 97.9 F (36.6 C) 97.8 F (36.6 C)  97.7 F (36.5 C)  TempSrc: Oral Oral  Oral  SpO2:  98% 97% 100%  Weight:      Height:        Physical Exam  nasal O2, no distress   No jvd  chest no rales or rhonchi   Cor reg no RG   Abd soft ntnd    RUA AVF placed 10.1.20   Alert, confused   Dialysis OP: Currently GO TTS2 - previously [facility]  4h 67min  400/800   80kg  2/2 bath R AVF  Hep 8000 venofer 50/week + Mircera 75 last 12/17  parsabiv 7.5, no calcitriol      Assessment/Plan: 1. COVID-19 PNA - Remdesivir/Decadron per primary.  2. ESRD - HD TTS. Had HD Sunday for last Sat on holiday schedule. Today pt refused to be stuck for HD. Will try again tomorrow.   3. HTN/volume-  BP low/stable. On metoprolol 50. UF 1-1.5L on HD as tolerated.  4. Anemia-  Hgb 8-9s.  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 On PhosLo + Renvela binder. Check Phos with next HD.  6. Nutrition - DYS 1 diet per SLP.  7. Acute embolic CVA - multiple acute infarcts/prior LV thrombus/ hx old strokes by imaging - Stroke team signed off 12/25. Heparin/warfarin bridge per pharmacy. Will need SNF at discharge.   Kelly Splinter, MD 08/06/2019, 3:41 PM    Medications: . sodium chloride    . sodium chloride    . heparin 850 Units/hr (08/05/19 1201)   .  stroke: mapping our early stages of recovery book   Does not apply Once  . allopurinol  100 mg Oral Daily  . aspirin  81 mg Oral Daily  . atorvastatin  40 mg Oral Daily  . calcium acetate  1,334 mg Oral TID WC  .  Chlorhexidine Gluconate Cloth  6 each Topical Q0600  . [START ON 08/07/2019] darbepoetin (ARANESP) injection - DIALYSIS  100 mcg Intravenous Q Wed-HD  . dexamethasone (DECADRON) injection  6 mg Intravenous Q24H  . insulin aspart  0-6 Units Subcutaneous Q4H  . metoCLOPramide  5 mg Oral TID WC  . metoprolol succinate  50 mg Oral Daily  . sevelamer carbonate  2.4 g Oral TID WC  . sodium chloride flush  3 mL Intravenous Q12H  . warfarin  2.5 mg Oral ONCE-1800  . Warfarin - Pharmacist Dosing Inpatient   Does not apply [phone]

## 2019-08-06 NOTE — Progress Notes (Signed)
Alerted floor RN Apolonio Schneiders that patient did another BM prior to his departure would not allow me to change him.Marland KitchenMarland KitchenMarland Kitchen

## 2019-08-06 NOTE — Progress Notes (Signed)
[facility]  PROGRESS NOTE  Jonathan Wilkins Sr. [id number] DOB: 18-Jan-1951 DOA: 07/30/2019 PCP: Marton Redwood, MD   Brief HPI:   68 year old male with history of ESRD on hemodialysis, Tuesday Thursday and Saturday history of renal transplant, history of stroke, hypertension, type 2 diabetes mellitus, left ventricular thrombus came to ED with difficulty swallowing.  His wife noticed him having difficulty swallowing for about 2 weeks.  He was tested positive for COVID-19 on December 16.  Chest x-ray showed right upper and right lower lobe, left lower lobe interstitial infiltrate. Patient was noted with diagnosis of acute hypoxic respiratory failure due to SARS COVID-19 viral pneumonia. Further work-up with brain MRI showed small acute infarct in the left pons, left occipital lobe and bilateral frontal lobes.  Initially failed swallow evaluation but fortunately swallowing improved, patient placed on dysphagia 1 diet with aspiration precautions.  Oral anticoagulation was resumed after discussion question with Dr. Leonie Man from neurology.    Subjective   Patient seen and examined, denies any complaints.   Assessment/Plan:     1. Acute hypoxic respiratory failure due to SARS COVID-19 viral pneumonia-patient is requiring 2 L/min of oxygen via nasal cannula, O2 sats 96%, respiratory rate 19/min.  Patient has completed 5 days of remdesivir treatment.  Currently on Decadron 6 mg IV every 24 hours.  COVID-19 Labs  Recent Labs    08/04/19 0500  DDIMER 0.61*  FERRITIN 2,745*  CRP 3.8*   Inflammatory markers are improving, CRP down to 3.8, ferritin slowly improving.  2.  Acute CVA-possible embolic phenomena, small acute infarct in left pons, left occipital lobe and bilateral frontal lobes.  Multiple chronic infarcts.  LDL 52.  Echocardiogram showed EF 30 to 35%, severe apical dyskinesis, apical left ventricular aneurysm.  Patient has no focal deficit at this time.  Started on anticoagulation  with warfarin with heparin bridging.  Target INR is 2-3.  Continue aspirin and statin.  3.  Hypertension-blood pressure stable, continue metoprolol succinate 50 mg daily.  4.  Uncontrolled type 2 diabetes mellitus-continue sliding scale insulin with NovoLog.  5. ESRD on hemodialysis/anemia of chronic disease/failed renal transplant-continue hemodialysis per nephrology.  Continue PhosLo, Renvela, Aranesp.  6. LV thrombus-continue anticoagulation with warfarin/heparin bridge   CBG: Recent Labs  Lab 08/06/19 0007 08/06/19 0512 08/06/19 0757 08/06/19 1142 08/06/19 1215  GLUCAP 160* 160* 141* 209* 201*    CBC: Recent Labs  Lab 07/30/19 1843 08/02/19 0311 08/03/19 0500 08/04/19 0500 08/05/19 0500 08/06/19 0420  WBC 6.8 9.0 9.9 10.1 8.6 9.6  NEUTROABS 5.6  --   --   --   --   --   HGB 9.6* 9.7* 9.2* 8.9* 9.6* 9.0*  HCT 29.3* 27.8* 27.5* 26.1* 28.0* 27.7*  MCV 103.2* 98.9 101.5* 100.0 100.0 104.1*  PLT 251 315 322 341 [id number]    Basic Metabolic Panel: Recent Labs  Lab 08/01/19 0340 08/02/19 0311 08/03/19 0500 08/04/19 0500 08/06/19 0420  NA 143 138 139 135 138  K 4.8 3.9 4.2 4.6 5.0  CL 97* 93* 95* 94* 97*  CO2 24 25 26 23 26   GLUCOSE 194* 170* 151* 152* 153*  BUN 78* 52* 84* 106* 76*  CREATININE 10.30* 7.07* 9.62* 11.11* 9.25*  CALCIUM 7.4* 7.5* 7.5* 7.7* 7.8*       DVT prophylaxis: Warfarin  Code Status: Full code  Family Communication: No family at bedside  Disposition Plan: likely skilled nursing facility when bed becomes available.        BMI  Estimated  body mass index is 23.46 kg/m as calculated from the following:   Height as of this encounter: 5\' 11"  (1.803 m).   Weight as of this encounter: 76.3 kg.  Scheduled medications:  .  stroke: mapping our early stages of recovery book   Does not apply Once  . allopurinol  100 mg Oral Daily  . aspirin  81 mg Oral Daily  . atorvastatin  40 mg Oral Daily  . calcium acetate  1,334 mg Oral TID  WC  . Chlorhexidine Gluconate Cloth  6 each Topical Q0600  . [START ON 08/07/2019] darbepoetin (ARANESP) injection - DIALYSIS  100 mcg Intravenous Q Wed-HD  . dexamethasone (DECADRON) injection  6 mg Intravenous Q24H  . insulin aspart  0-6 Units Subcutaneous Q4H  . metoCLOPramide  5 mg Oral TID WC  . metoprolol succinate  50 mg Oral Daily  . sevelamer carbonate  2.4 g Oral TID WC  . sodium chloride flush  3 mL Intravenous Q12H  . warfarin  2.5 mg Oral ONCE-1800  . Warfarin - Pharmacist Dosing Inpatient   Does not apply q1800       Antibiotics:   Anti-infectives (From admission, onward)   Start     Dose/Rate Route Frequency Ordered Stop   07/31/19 1000  remdesivir 100 mg in sodium chloride 0.9 % 100 mL IVPB     100 mg 200 mL/hr over 30 Minutes Intravenous Daily 07/30/19 2217 08/04/19 0748   07/31/19 0800  ceFEPIme (MAXIPIME) 1 g in sodium chloride 0.9 % 100 mL IVPB  Status:  Discontinued     1 g 200 mL/hr over 30 Minutes Intravenous Every 24 hours 07/31/19 0748 07/31/19 0816   07/31/19 0800  vancomycin (VANCOREADY) IVPB 1500 mg/300 mL  Status:  Discontinued     1,500 mg 150 mL/hr over 120 Minutes Intravenous  Once 07/31/19 0748 07/31/19 0816   07/31/19 0748  vancomycin variable dose per unstable renal function (pharmacist dosing)  Status:  Discontinued      Does not apply See admin instructions 07/31/19 0748 07/31/19 0816   07/30/19 2230  remdesivir 200 mg in sodium chloride 0.9% 250 mL IVPB     200 mg 580 mL/hr over 30 Minutes Intravenous Once 07/30/19 2217 07/31/19 0045       Objective   Vitals:   08/05/19 2135 08/06/19 0753 08/06/19 0800 08/06/19 1134  BP:  130/62 124/63 133/65  Pulse: 96  73 78  Resp: (!) 24 15 13 14   Temp: 97.9 F (36.6 C) 97.8 F (36.6 C)  97.7 F (36.5 C)  TempSrc: Oral Oral  Oral  SpO2:  98% 97% 100%  Weight:      Height:        Intake/Output Summary (Last 24 hours) at 08/06/2019 1424 Last data filed at 08/06/2019 1300 Gross per 24 hour   Intake 454.93 ml  Output 0 ml  Net 454.93 ml   Filed Weights   08/04/19 1610 08/04/19 2020 08/05/19 0422  Weight: 73 kg 72 kg 76.3 kg     Physical Examination:   General-appears in no acute distress  Heart-S1-S2, regular, no murmur auscultated  Lungs-clear to auscultation bilaterally, no wheezing or crackles auscultated  Abdomen-soft, nontender, no organomegaly  Extremities-no edema in the lower extremities  Neuro-alert, oriented x3, no focal deficit noted     Data Reviewed: I have personally reviewed following labs and imaging studies   Recent Results (from the past 240 hour(s))  Blood Culture (routine x 2)  Status: None   Collection Time: 07/30/19  6:43 PM   Specimen: BLOOD  Result Value Ref Range Status   Specimen Description BLOOD SITE NOT SPECIFIED  Final   Special Requests   Final    BOTTLES DRAWN AEROBIC AND ANAEROBIC Blood Culture results may not be optimal due to an inadequate volume of blood received in culture bottles   Culture   Final    NO GROWTH 5 DAYS Performed at [facility], [place] [address] [place], [place] [address]    Report Status 08/04/2019 FINAL  Final  MRSA PCR Screening     Status: None   Collection Time: 08/02/19  8:43 AM   Specimen: Nasopharyngeal  Result Value Ref Range Status   MRSA by PCR NEGATIVE NEGATIVE Final    Comment:        The GeneXpert MRSA Assay (FDA approved for NASAL specimens only), is one component of a comprehensive MRSA colonization surveillance program. It is not intended to diagnose MRSA infection nor to guide or monitor treatment for MRSA infections. Performed at [facility], [place] [address], [place], [place] [address]      Liver Function Tests: Recent Labs  Lab 07/30/19 1843 08/01/19 0340 08/02/19 0311 08/03/19 0500 08/04/19 0500  AST 26 17 20  12* 11*  ALT 20 17 20 17 15   ALKPHOS 43 39 42 44 52  BILITOT 0.8 0.4 0.7 0.4 0.3  PROT 6.8 6.2* 6.4* 6.3* 5.5*  ALBUMIN 2.7* 2.4* 2.6*  2.5* 2.4*   No results for input(s): LIPASE, AMYLASE in the last 168 hours. No results for input(s): AMMONIA in the last 168 hours.  Cardiac Enzymes: No results for input(s): CKTOTAL, CKMB, CKMBINDEX, TROPONINI in the last 168 hours. BNP (last 3 results) Recent Labs    07/30/19 1843  BNP 797.5*      Admission status: Inpatient: Based on patients clinical presentation and evaluation of above clinical data, I have made determination that patient meets Inpatient criteria at this time.   Oswald Hillock   [facility] If 7PM-7AM, please contact night-coverage at [email or web address], Office  [phone]  password [email or web address]  08/06/2019, 2:24 PM  LOS: 7 days

## 2019-08-07 LAB — GLUCOSE, CAPILLARY
Glucose-Capillary: 118 mg/dL — ABNORMAL HIGH (ref 70–99)
Glucose-Capillary: 124 mg/dL — ABNORMAL HIGH (ref 70–99)
Glucose-Capillary: 130 mg/dL — ABNORMAL HIGH (ref 70–99)
Glucose-Capillary: 158 mg/dL — ABNORMAL HIGH (ref 70–99)
Glucose-Capillary: 171 mg/dL — ABNORMAL HIGH (ref 70–99)
Glucose-Capillary: 186 mg/dL — ABNORMAL HIGH (ref 70–99)
Glucose-Capillary: 191 mg/dL — ABNORMAL HIGH (ref 70–99)

## 2019-08-07 LAB — BASIC METABOLIC PANEL
Anion gap: 18 — ABNORMAL HIGH (ref 5–15)
BUN: 102 mg/dL — ABNORMAL HIGH (ref 8–23)
CO2: 24 mmol/L (ref 22–32)
Calcium: 8.2 mg/dL — ABNORMAL LOW (ref 8.9–10.3)
Chloride: 96 mmol/L — ABNORMAL LOW (ref 98–111)
Creatinine, Ser: 11.08 mg/dL — ABNORMAL HIGH (ref 0.61–1.24)
GFR calc Af Amer: 5 mL/min — ABNORMAL LOW (ref >60)
GFR calc non Af Amer: 4 mL/min — ABNORMAL LOW (ref >60)
Glucose, Bld: 185 mg/dL — ABNORMAL HIGH (ref 70–99)
Potassium: 5.5 mmol/L — ABNORMAL HIGH (ref 3.5–5.1)
Sodium: 138 mmol/L (ref 135–145)

## 2019-08-07 LAB — FERRITIN: Ferritin: 2184 ng/mL — ABNORMAL HIGH (ref 24–336)

## 2019-08-07 LAB — CBC
HCT: 28.7 % — ABNORMAL LOW (ref 39.0–52.0)
Hemoglobin: 9.3 g/dL — ABNORMAL LOW (ref 13.0–17.0)
MCH: 34.1 pg — ABNORMAL HIGH (ref 26.0–34.0)
MCHC: 32.4 g/dL (ref 30.0–36.0)
MCV: 105.1 fL — ABNORMAL HIGH (ref 80.0–100.0)
Platelets: 306 10*3/uL (ref 150–400)
RBC: 2.73 MIL/uL — ABNORMAL LOW (ref 4.22–5.81)
RDW: 17 % — ABNORMAL HIGH (ref 11.5–15.5)
WBC: 9.4 10*3/uL (ref 4.0–10.5)
nRBC: 0.5 % — ABNORMAL HIGH (ref 0.0–0.2)

## 2019-08-07 LAB — PROTIME-INR
INR: 2.4 — ABNORMAL HIGH (ref 0.8–1.2)
Prothrombin Time: 26.3 seconds — ABNORMAL HIGH (ref 11.4–15.2)

## 2019-08-07 LAB — C-REACTIVE PROTEIN: CRP: 3.2 mg/dL — ABNORMAL HIGH (ref <1.0)

## 2019-08-07 LAB — HEPARIN LEVEL (UNFRACTIONATED): Heparin Unfractionated: 0.31 IU/mL (ref 0.30–0.70)

## 2019-08-07 MED ORDER — HEPARIN SODIUM (PORCINE) 1000 UNIT/ML DIALYSIS: 1000.0000 [IU] | INTRAMUSCULAR | Status: DC | PRN

## 2019-08-07 MED ORDER — SODIUM CHLORIDE 0.9 % IV SOLN: 100.0000 mL | INTRAVENOUS | Status: DC | PRN

## 2019-08-07 MED ORDER — DARBEPOETIN ALFA 100 MCG/0.5ML IJ SOSY
PREFILLED_SYRINGE | INTRAMUSCULAR | Status: AC
  Administered 2019-08-07: 100 ug via INTRAVENOUS
  Filled 2019-08-07: qty 0.5

## 2019-08-07 MED ORDER — HEPARIN SODIUM (PORCINE) 1000 UNIT/ML DIALYSIS
8000.0000 [IU] | Freq: Once | INTRAMUSCULAR | Status: DC
  Filled 2019-08-07: qty 8

## 2019-08-07 MED ORDER — WARFARIN SODIUM 5 MG PO TABS
5.0000 mg | ORAL_TABLET | Freq: Once | ORAL | Status: AC
  Administered 2019-08-07: 5 mg via ORAL
  Filled 2019-08-07: qty 1

## 2019-08-07 MED ORDER — ALTEPLASE 2 MG IJ SOLR: 2.0000 mg | Freq: Once | INTRAMUSCULAR | Status: DC | PRN

## 2019-08-07 MED ORDER — HEPARIN SODIUM (PORCINE) 1000 UNIT/ML DIALYSIS: 20.0000 [IU]/kg | INTRAMUSCULAR | Status: DC | PRN

## 2019-08-07 MED ORDER — CHLORHEXIDINE GLUCONATE CLOTH 2 % EX PADS: 6.0000 | MEDICATED_PAD | Freq: Every day | CUTANEOUS | Status: DC

## 2019-08-07 NOTE — Progress Notes (Signed)
  [facility] Progress Note   Subjective:  Patient on dialysis now. Confused. Didn't tolerate any UF, drop BP's into 80's early w/ dec'd MS.   Objective Vitals:   08/07/19 1030 08/07/19 1100 08/07/19 1115 08/07/19 1130  BP: (!) 142/64 (!) 86/44  (!) 84/34  Pulse: 80 86 88 81  Resp:  (!) 22 20 17   Temp:      TempSrc:      SpO2:  97% 92% 94%  Weight:      Height:        Physical Exam  nasal O2, no distress   No jvd  chest no rales or rhonchi   Cor reg no RG   Abd soft ntnd    RUA AVF placed 10.1.20   Alert, confused   Dialysis OP: Currently GO TTS2 - previously [facility]  4h 30min  400/800   80kg  2/2 bath R AVF  Hep 8000 venofer 50/week + Mircera 75 last 12/17  parsabiv 7.5, no calcitriol      Assessment/Plan: 1. COVID-19 PNA - Remdesivir/Decadron per primary.  2. ESRD - HD TTS. HD today for yesterday. Plan HD tomorrow on schedule.    3. HTN/volume-  BP low/stable. On metoprolol 50. Did not tolerate UF today, keep even on HD.   4. Anemia-  Hgb 8-9s.  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 On PhosLo + Renvela binder. Check Phos with next HD.  6. Nutrition - DYS 1 diet per SLP.  7. Acute embolic CVA - multiple acute infarcts/prior LV thrombus/ hx old strokes by imaging - Stroke team signed off 12/25. Heparin/warfarin bridge per pharmacy. Will need SNF at discharge.   Kelly Splinter, MD 08/07/2019, 12:10 PM    Medications: . sodium chloride    . sodium chloride    . [START ON 08/08/2019] sodium chloride    . [START ON 08/08/2019] sodium chloride     .  stroke: mapping our early stages of recovery book   Does not apply Once  . allopurinol  100 mg Oral Daily  . aspirin  81 mg Oral Daily  . atorvastatin  40 mg Oral Daily  . calcium acetate  1,334 mg Oral TID WC  . Chlorhexidine Gluconate Cloth  6 each Topical Q0600  . Darbepoetin Alfa      . darbepoetin (ARANESP) injection - DIALYSIS  100 mcg Intravenous Q Wed-HD  .  dexamethasone (DECADRON) injection  6 mg Intravenous Q24H  . [START ON 08/08/2019] heparin  8,000 Units Dialysis Once in dialysis  . insulin aspart  0-6 Units Subcutaneous Q4H  . metoCLOPramide  5 mg Oral TID WC  . metoprolol succinate  50 mg Oral Daily  . sevelamer carbonate  2.4 g Oral TID WC  . sodium chloride flush  3 mL Intravenous Q12H  . warfarin  5 mg Oral ONCE-1800  . Warfarin - Pharmacist Dosing Inpatient   Does not apply [phone]

## 2019-08-07 NOTE — Progress Notes (Signed)
  Speech Language Pathology Treatment: Dysphagia  Patient Details Name: Jonathan Ackerman Sr. MRN: [id number] DOB: March 08, 1951 Today's Date: 08/07/2019 Time: [id number] SLP Time Calculation (min) (ACUTE ONLY): 15 min  Assessment / Plan / Recommendation Clinical Impression  Pt was resistant to SLP cueing or trying to direct therapy session (said "leave me alone" repeatedly), but did take a bite of his lunch tray before perseverating on licking the spoon, even when the spoon was empty. He also drank thin liquids when offered by RN. He took single, individual sips without overt signs of difficulty and no coughing, although he started to have delayed throat clearing toward the end, which may have been related to pt tipping his head back more to get to the contents at the bottom of the cup. Unfortunately, pt would not allow SLP to do a more thorough assessment or additional challenging and he is at risk for dysphagia/aspiration (potentially silent) given his pontine stroke. That being said, he had also been coughing with thin liquids during prior SLP visits, so there is the potential he has had some improvement. Would continue Dys 1 diet and nectar thick liquids, but would allow a few, single sips of plain water immediately after oral care and under staff supervision. SLP will f/u for tolerance and potential to advance diet.    HPI HPI: Jonathan Krysinski Sr. is a 68 y.o. male with history of ESRD on hemodialysis, LV thrombus, renal transplant, stroke, hypertension diabetes mellitus was having increasing difficulty swallowing over the last 2 weeks as noticed by patient's wife (pocketing his food. Per chart 6 days ago diagnosed with Covid at the time he was not symptomatic. Now more short of breath and lethargic and confused. 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 Chest x-ray showing  multifocal pneumonia.      SLP Plan  Continue with current plan of care       Recommendations  Diet recommendations: Dysphagia 1 (puree);Nectar-thick liquid Liquids provided via: Cup;No straw Medication Administration: Crushed with puree Supervision: Staff to assist with self feeding Compensations: Slow rate;Minimize environmental distractions;Small sips/bites;Lingual sweep for clearance of pocketing Postural Changes and/or Swallow Maneuvers: Seated upright 90 degrees                Oral Care Recommendations: Oral care BID Follow up Recommendations: Skilled Nursing facility SLP Visit Diagnosis: Dysphagia, oropharyngeal phase (R13.12) Plan: Continue with current plan of care       GO                 Osie Bond., M.A. [facility] Pager [phone] Office [phone]  08/07/2019, 4:25 PM

## 2019-08-07 NOTE — Progress Notes (Signed)
[place] for Heparin and Warfarin Indication: h/o LV thrombus and new CVA   No Known Allergies  Patient Measurements: Height: 5\' 11"  (180.3 cm) Weight: 165 lb 2 oz (74.9 kg) IBW/kg (Calculated) : 75.3  Vital Signs: Temp: 97.9 F (36.6 C) (12/29 2000) Temp Source: Oral (12/29 2000) BP: 151/76 (12/30 0441) Pulse Rate: 79 (12/30 0441)  Labs: Recent Labs    08/05/19 0500 08/05/19 1540 08/06/19 0420 08/07/19 0500  HGB 9.6*  --  9.0* 9.3*  HCT 28.0*  --  27.7* 28.7*  PLT 287  --  292 306  APTT 49* 73* 90*  --   LABPROT 16.7*  --  23.7* 26.3*  INR 1.4*  --  2.1* 2.4*  HEPARINUNFRC 0.42 0.46 0.48 0.31  CREATININE  --   --  9.25*  --     Estimated Creatinine Clearance: 8.3 mL/min (A) (by C-G formula based on SCr of 9.25 mg/dL (H)).   Assessment: 68 y.o. 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 Last Eliquis dose ~1000 on 12/22. Patient is currently on heparin drip at 700 units/hr for new CVA (MRI confirmed).   Given acute CVA and risk for hemorrhagic conversion, will set aPTT and heparin level at lower end of goal as listed below. Baseline heparin level before starting infusion was >2.20, indicating that apixaban is still influencing heparin levels at this point. Due to recent [place], will monitor anticoagulation using aPTT initially  Pharmacy has now been consulted to start bridge from heparin IV infusion to warfarin in this patient.   Heparin level lower end therapeutic this AM, and INR remains therapeutic x 2.    Goal of Therapy:  Heparin level 0.3-0.5 units/ml INR goal 2 - 3 Monitor platelets by anticoagulation protocol: Yes   Plan:  Continue heparin gtt at 850 units/hr Give warfarin 5mg  PO x 1 today Daily INR, heparin level, CBC, s/s bleeding F/u d/c heparin gtt today   Bertis Ruddy, PharmD Clinical Pharmacist Please check AMION for all [place]  numbers 08/07/2019 7:52 AM

## 2019-08-07 NOTE — TOC Progression Note (Signed)
Transition of Care (TOC) - Progression Note    Patient Details  Name: Jonathan Holk Sr. MRN: [id number] Date of Birth: May 14, 1951  Transition of Care [facility]) CM/SW [place], LCSW Phone Number: 08/07/2019, 10:29 AM  Clinical Narrative:    No SNF bed offers at this time due to Dialysis transportation needs. CSW to continue to follow.   Expected Discharge Plan: [place]    Expected Discharge Plan and Services Expected Discharge Plan: [place] arrangements for the past 2 months: Single Family Home                                       Social Determinants of Health (SDOH) Interventions    Readmission Risk Interventions No flowsheet data found.

## 2019-08-07 NOTE — Progress Notes (Signed)
[facility]  PROGRESS NOTE  Jonathan Nitzsche Sr. [id number] DOB: 1950-11-30 DOA: 07/30/2019 PCP: Marton Redwood, MD   Brief HPI:   68 year old male with history of ESRD on hemodialysis, Tuesday Thursday and Saturday history of renal transplant, history of stroke, hypertension, type 2 diabetes mellitus, left ventricular thrombus came to ED with difficulty swallowing.  His wife noticed him having difficulty swallowing for about 2 weeks.  He was tested positive for COVID-19 on December 16.  Chest x-ray showed right upper and right lower lobe, left lower lobe interstitial infiltrate. Patient was noted with diagnosis of acute hypoxic respiratory failure due to SARS COVID-19 viral pneumonia. Further work-up with brain MRI showed small acute infarct in the left pons, left occipital lobe and bilateral frontal lobes.  Initially failed swallow evaluation but fortunately swallowing improved, patient placed on dysphagia 1 diet with aspiration precautions.  Oral anticoagulation was resumed after discussion question with Dr. Leonie Man from neurology.    Subjective   Patient seen and examined, continues to be confused.   Assessment/Plan:     1. Acute hypoxic respiratory failure due to SARS COVID-19 viral pneumonia-patient is requiring 2 L/min of oxygen via nasal cannula, O2 sats 96%, respiratory rate 19/min.  Patient has completed 5 days of remdesivir treatment.  Currently on Decadron 6 mg IV every 24 hours.  Day #9/10    Inflammatory markers are improving, CRP down to 3.8, ferritin slowly improving.  We will repeat inflammatory markers today.  2.  Acute CVA-possible embolic phenomena, small acute infarct in left pons, left occipital lobe and bilateral frontal lobes.  Multiple chronic infarcts.  LDL 52.  Echocardiogram showed EF 30 to 35%, severe apical dyskinesis, apical left ventricular aneurysm.  Patient has no focal deficit at this time.  Started on anticoagulation with warfarin with heparin  bridging.  Target INR is 2-3.  Today INR is 2.4.  Will discontinue heparin.  Continue warfarin per pharmacy consultation.  Continue aspirin and statin.  3.  Hypertension-blood pressure stable, continue metoprolol succinate 50 mg daily.  4.  Uncontrolled type 2 diabetes mellitus-continue sliding scale insulin with NovoLog.  5. ESRD on hemodialysis/anemia of chronic disease/failed renal transplant-continue hemodialysis per nephrology.  Continue PhosLo, Renvela, Aranesp.  6. LV thrombus-continue anticoagulation with warfarin   CBG: Recent Labs  Lab 08/07/19 0054 08/07/19 0415 08/07/19 0459 08/07/19 0757 08/07/19 1225  GLUCAP 171* 186* 191* 158* 124*    CBC: Recent Labs  Lab 08/03/19 0500 08/04/19 0500 08/05/19 0500 08/06/19 0420 08/07/19 0500  WBC 9.9 10.1 8.6 9.6 9.4  HGB 9.2* 8.9* 9.6* 9.0* 9.3*  HCT 27.5* 26.1* 28.0* 27.7* 28.7*  MCV 101.5* 100.0 100.0 104.1* 105.1*  PLT 322 341 287 292 [id number]    Basic Metabolic Panel: Recent Labs  Lab 08/02/19 0311 08/03/19 0500 08/04/19 0500 08/06/19 0420 08/07/19 0500  NA 138 139 135 138 138  K 3.9 4.2 4.6 5.0 5.5*  CL 93* 95* 94* 97* 96*  CO2 25 26 23 26 24   GLUCOSE 170* 151* 152* 153* 185*  BUN 52* 84* 106* 76* 102*  CREATININE 7.07* 9.62* 11.11* 9.25* 11.08*  CALCIUM 7.5* 7.5* 7.7* 7.8* 8.2*  MG  --   --   --  2.4  --        DVT prophylaxis: Warfarin  Code Status: Full code  Family Communication: No family at bedside  Disposition Plan: likely skilled nursing facility when bed becomes available.        BMI  Estimated body mass index is  23.12 kg/m as calculated from the following:   Height as of this encounter: 5\' 11"  (1.803 m).   Weight as of this encounter: 75.2 kg.  Scheduled medications:  .  stroke: mapping our early stages of recovery book   Does not apply Once  . allopurinol  100 mg Oral Daily  . aspirin  81 mg Oral Daily  . atorvastatin  40 mg Oral Daily  . calcium acetate  1,334 mg Oral TID  WC  . Chlorhexidine Gluconate Cloth  6 each Topical Q0600  . Darbepoetin Alfa      . darbepoetin (ARANESP) injection - DIALYSIS  100 mcg Intravenous Q Wed-HD  . dexamethasone (DECADRON) injection  6 mg Intravenous Q24H  . [START ON 08/08/2019] heparin  8,000 Units Dialysis Once in dialysis  . insulin aspart  0-6 Units Subcutaneous Q4H  . metoCLOPramide  5 mg Oral TID WC  . metoprolol succinate  50 mg Oral Daily  . sevelamer carbonate  2.4 g Oral TID WC  . sodium chloride flush  3 mL Intravenous Q12H  . warfarin  5 mg Oral ONCE-1800  . Warfarin - Pharmacist Dosing Inpatient   Does not apply q1800       Antibiotics:   Anti-infectives (From admission, onward)   Start     Dose/Rate Route Frequency Ordered Stop   07/31/19 1000  remdesivir 100 mg in sodium chloride 0.9 % 100 mL IVPB     100 mg 200 mL/hr over 30 Minutes Intravenous Daily 07/30/19 2217 08/04/19 0748   07/31/19 0800  ceFEPIme (MAXIPIME) 1 g in sodium chloride 0.9 % 100 mL IVPB  Status:  Discontinued     1 g 200 mL/hr over 30 Minutes Intravenous Every 24 hours 07/31/19 0748 07/31/19 0816   07/31/19 0800  vancomycin (VANCOREADY) IVPB 1500 mg/300 mL  Status:  Discontinued     1,500 mg 150 mL/hr over 120 Minutes Intravenous  Once 07/31/19 0748 07/31/19 0816   07/31/19 0748  vancomycin variable dose per unstable renal function (pharmacist dosing)  Status:  Discontinued      Does not apply See admin instructions 07/31/19 0748 07/31/19 0816   07/30/19 2230  remdesivir 200 mg in sodium chloride 0.9% 250 mL IVPB     200 mg 580 mL/hr over 30 Minutes Intravenous Once 07/30/19 2217 07/31/19 0045       Objective   Vitals:   08/07/19 1100 08/07/19 1115 08/07/19 1130 08/07/19 1200  BP: (!) 86/44  (!) 84/34 (!) 92/48  Pulse: 86 88 81   Resp: (!) 22 20 17    Temp:      TempSrc:      SpO2: 97% 92% 94%   Weight:      Height:        Intake/Output Summary (Last 24 hours) at 08/07/2019 1234 Last data filed at 08/07/2019  0025 Gross per 24 hour  Intake 121.59 ml  Output 375 ml  Net -253.41 ml   Filed Weights   08/05/19 0422 08/07/19 0441 08/07/19 1000  Weight: 76.3 kg 74.9 kg 75.2 kg     Physical Examination:  General-appears in no acute distress Heart-S1-S2, regular, no murmur auscultated Lungs-clear to auscultation bilaterally, no wheezing or crackles auscultated Abdomen-soft, nontender, no organomegaly Extremities-no edema in the lower extremities Neuro-alert, confused    Data Reviewed: I have personally reviewed following labs and imaging studies   Recent Results (from the past 240 hour(s))  Blood Culture (routine x 2)     Status: None  Collection Time: 07/30/19  6:43 PM   Specimen: BLOOD  Result Value Ref Range Status   Specimen Description BLOOD SITE NOT SPECIFIED  Final   Special Requests   Final    BOTTLES DRAWN AEROBIC AND ANAEROBIC Blood Culture results may not be optimal due to an inadequate volume of blood received in culture bottles   Culture   Final    NO GROWTH 5 DAYS Performed at [facility], [place] [address] [place], [place] [address]    Report Status 08/04/2019 FINAL  Final  MRSA PCR Screening     Status: None   Collection Time: 08/02/19  8:43 AM   Specimen: Nasopharyngeal  Result Value Ref Range Status   MRSA by PCR NEGATIVE NEGATIVE Final    Comment:        The GeneXpert MRSA Assay (FDA approved for NASAL specimens only), is one component of a comprehensive MRSA colonization surveillance program. It is not intended to diagnose MRSA infection nor to guide or monitor treatment for MRSA infections. Performed at [facility], [place] [address] [place], [place] [address]      Liver Function Tests: Recent Labs  Lab 08/01/19 0340 08/02/19 0311 08/03/19 0500 08/04/19 0500  AST 17 20 12* 11*  ALT 17 20 17 15   ALKPHOS 39 42 44 52  BILITOT 0.4 0.7 0.4 0.3  PROT 6.2* 6.4* 6.3* 5.5*  ALBUMIN 2.4* 2.6* 2.5* 2.4*   No results for input(s): LIPASE,  AMYLASE in the last 168 hours. No results for input(s): AMMONIA in the last 168 hours.  Cardiac Enzymes: No results for input(s): CKTOTAL, CKMB, CKMBINDEX, TROPONINI in the last 168 hours. BNP (last 3 results) Recent Labs    07/30/19 1843  BNP 797.5*      Admission status: Inpatient: Based on patients clinical presentation and evaluation of above clinical data, I have made determination that patient meets Inpatient criteria at this time.   Oswald Hillock   [facility] If 7PM-7AM, please contact night-coverage at [email or web address], Office  [phone]  password [place]  08/07/2019, 12:34 PM  LOS: 8 days

## 2019-08-08 LAB — CBC
HCT: 26 % — ABNORMAL LOW (ref 39.0–52.0)
Hemoglobin: 8.5 g/dL — ABNORMAL LOW (ref 13.0–17.0)
MCH: 34 pg (ref 26.0–34.0)
MCHC: 32.7 g/dL (ref 30.0–36.0)
MCV: 104 fL — ABNORMAL HIGH (ref 80.0–100.0)
Platelets: 238 10*3/uL (ref 150–400)
RBC: 2.5 MIL/uL — ABNORMAL LOW (ref 4.22–5.81)
RDW: 16.9 % — ABNORMAL HIGH (ref 11.5–15.5)
WBC: 8.9 10*3/uL (ref 4.0–10.5)
nRBC: 0.4 % — ABNORMAL HIGH (ref 0.0–0.2)

## 2019-08-08 LAB — GLUCOSE, CAPILLARY
Glucose-Capillary: 131 mg/dL — ABNORMAL HIGH (ref 70–99)
Glucose-Capillary: 144 mg/dL — ABNORMAL HIGH (ref 70–99)
Glucose-Capillary: 147 mg/dL — ABNORMAL HIGH (ref 70–99)
Glucose-Capillary: 150 mg/dL — ABNORMAL HIGH (ref 70–99)
Glucose-Capillary: 151 mg/dL — ABNORMAL HIGH (ref 70–99)
Glucose-Capillary: 220 mg/dL — ABNORMAL HIGH (ref 70–99)

## 2019-08-08 LAB — BASIC METABOLIC PANEL
Anion gap: 15 (ref 5–15)
BUN: 55 mg/dL — ABNORMAL HIGH (ref 8–23)
CO2: 25 mmol/L (ref 22–32)
Calcium: 7.7 mg/dL — ABNORMAL LOW (ref 8.9–10.3)
Chloride: 94 mmol/L — ABNORMAL LOW (ref 98–111)
Creatinine, Ser: 7.56 mg/dL — ABNORMAL HIGH (ref 0.61–1.24)
GFR calc Af Amer: 8 mL/min — ABNORMAL LOW (ref >60)
GFR calc non Af Amer: 7 mL/min — ABNORMAL LOW (ref >60)
Glucose, Bld: 153 mg/dL — ABNORMAL HIGH (ref 70–99)
Potassium: 5.3 mmol/L — ABNORMAL HIGH (ref 3.5–5.1)
Sodium: 134 mmol/L — ABNORMAL LOW (ref 135–145)

## 2019-08-08 LAB — PROTIME-INR
INR: 2.8 — ABNORMAL HIGH (ref 0.8–1.2)
Prothrombin Time: 29.6 seconds — ABNORMAL HIGH (ref 11.4–15.2)

## 2019-08-08 MED ORDER — WARFARIN SODIUM 2.5 MG PO TABS
2.5000 mg | ORAL_TABLET | Freq: Once | ORAL | Status: AC
  Administered 2019-08-08: 2.5 mg via ORAL
  Filled 2019-08-08: qty 1

## 2019-08-08 NOTE — Discharge Instructions (Signed)

## 2019-08-08 NOTE — Progress Notes (Signed)
[place] for Warfarin Indication: h/o LV thrombus and new CVA   No Known Allergies  Patient Measurements: Height: 5\' 11"  (180.3 cm) Weight: 165 lb 12.6 oz (75.2 kg) IBW/kg (Calculated) : 75.3  Vital Signs: Temp: 97.6 F (36.4 C) (12/31 0441) Temp Source: Axillary (12/31 0441) BP: 116/53 (12/31 0441) Pulse Rate: 80 (12/31 0441)  Labs: Recent Labs    08/05/19 1540 08/06/19 0420 08/07/19 0500 08/08/19 0500  HGB  --  9.0* 9.3* 8.5*  HCT  --  27.7* 28.7* 26.0*  PLT  --  292 306 238  APTT 73* 90*  --   --   LABPROT  --  23.7* 26.3* 29.6*  INR  --  2.1* 2.4* 2.8*  HEPARINUNFRC 0.46 0.48 0.31  --   CREATININE  --  9.25* 11.08* 7.56*    Estimated Creatinine Clearance: 10.2 mL/min (A) (by C-G formula based on SCr of 7.56 mg/dL (H)).   Assessment: 68 y.o. 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 Last Eliquis dose ~1000 on 12/22. Patient is currently on heparin drip at 700 units/hr for new CVA (MRI confirmed).   Given acute CVA and risk for hemorrhagic conversion, will set aPTT and heparin level at lower end of goal as listed below. Baseline heparin level before starting infusion was >2.20, indicating that apixaban is still influencing heparin levels at this point. Due to recent Apixiaban, will monitor anticoagulation using aPTT initially.  Heparin gtt d/c 12/30.  INR today remains therapeutic at 2.8, CBC stable, no bleeding reported  Goal of Therapy:  INR goal 2 - 3 Monitor platelets by anticoagulation protocol: Yes   Plan:  Give warfarin 2.5 mg PO x 1 today Daily INR, s/s bleeding  Bertis Ruddy, PharmD Clinical Pharmacist Please check AMION for all [place] numbers 08/08/2019 8:04 AM

## 2019-08-08 NOTE — Progress Notes (Signed)
  [facility] Progress Note   Subjective:   Patient not examined today directly given COVID-19 + status, utilizing exam of the primary team and observations of RN's.    Objective Vitals:   08/07/19 1649 08/07/19 1949 08/08/19 0441 08/08/19 0800  BP: (!) 112/93 (!) 121/91 (!) 116/53 124/69  Pulse: 84 90 80 76  Resp: 19 16 16 13   Temp: 98.2 F (36.8 C) 98.8 F (37.1 C) 97.6 F (36.4 C)   TempSrc: Oral Axillary Axillary   SpO2: 94% (!) 89% 91% 98%  Weight:      Height:        Physical Exam   Patient not examined today directly given COVID-19 + status, utilizing exam of the primary team and observations of RN's.     Dialysis OP: Currently GO TTS2 - previously [facility]  4h 63min  400/800   80kg  2/2 bath R AVF  Hep 8000 venofer 50/week + Mircera 75 last 12/17  parsabiv 7.5, no calcitriol      Assessment/Plan: 1. COVID-19 PNA - Remdesivir/Decadron per primary.  2. Acute embolic CVA - multiple acute infarcts/prior LV thrombus/ hx old strokes by imaging - Stroke team signed off 12/25. Heparin/warfarin bridge per pharmacy. Will need SNF at discharge 3. ESRD - HD TTS. Plan HD today 4. HTN/volume-  BP's stable. On metoprolol 50. Under dry wt 5kg, no vol on, did not tolerate attempt at UF yest. Keep even on HD.  5. Anemia-  Hgb 8-9s.  Aranesp 100 q Wed (next dose 12/30)  6. Metabolic Bone Disease- Corr Ca ok. On PhosLo + Renvela binder. Check Phos with next HD.  7. Nutrition - DYS 1 diet per SLP.    Kelly Splinter, MD 08/08/2019, 12:48 PM    Medications: . sodium chloride    . sodium chloride    . sodium chloride    . sodium chloride     .  stroke: mapping our early stages of recovery book   Does not apply Once  . allopurinol  100 mg Oral Daily  . aspirin  81 mg Oral Daily  . atorvastatin  40 mg Oral Daily  . calcium acetate  1,334 mg Oral TID WC  . Chlorhexidine Gluconate Cloth  6 each Topical Q0600  . darbepoetin (ARANESP) injection - DIALYSIS  100 mcg  Intravenous Q Wed-HD  . dexamethasone (DECADRON) injection  6 mg Intravenous Q24H  . heparin  8,000 Units Dialysis Once in dialysis  . insulin aspart  0-6 Units Subcutaneous Q4H  . metoCLOPramide  5 mg Oral TID WC  . metoprolol succinate  50 mg Oral Daily  . sevelamer carbonate  2.4 g Oral TID WC  . sodium chloride flush  3 mL Intravenous Q12H  . warfarin  2.5 mg Oral ONCE-1800  . Warfarin - Pharmacist Dosing Inpatient   Does not apply [phone]

## 2019-08-08 NOTE — Progress Notes (Signed)
[facility]  PROGRESS NOTE  Jonathan Swagerty Sr. [id number] DOB: 11-14-1950 DOA: 07/30/2019 PCP: Marton Redwood, MD   Brief HPI:   68 year old male with history of ESRD on hemodialysis, Tuesday Thursday and Saturday history of renal transplant, history of stroke, hypertension, type 2 diabetes mellitus, left ventricular thrombus came to ED with difficulty swallowing.  His wife noticed him having difficulty swallowing for about 2 weeks.  He was tested positive for COVID-19 on December 16.  Chest x-ray showed right upper and right lower lobe, left lower lobe interstitial infiltrate. Patient was noted with diagnosis of acute hypoxic respiratory failure due to SARS COVID-19 viral pneumonia. Further work-up with brain MRI showed small acute infarct in the left pons, left occipital lobe and bilateral frontal lobes.  Initially failed swallow evaluation but fortunately swallowing improved, patient placed on dysphagia 1 diet with aspiration precautions.  Oral anticoagulation was resumed after discussion question with Dr. Leonie Man from neurology.    Subjective   Patient seen and examined, denies chest pain or shortness of breath.   Assessment/Plan:     1. Acute hypoxic respiratory failure due to SARS COVID-19 viral pneumonia-patient is requiring 2 L/min of oxygen via nasal cannula, O2 sats 96%, respiratory rate 19/min.  Patient has completed 5 days of remdesivir treatment.  Currently on Decadron 6 mg IV every 24 hours.  Day #9/10    Inflammatory markers are improving, CRP down to 3.2, ferritin slowly improving.    COVID-19 Labs  Recent Labs    08/07/19 1446  FERRITIN 2,184*  CRP 3.2*    Lab Results  Component Value Date   SARSCOV2NAA POSITIVE (A) 07/24/2019    2.  Acute CVA-possible embolic phenomena, small acute infarct in left pons, left occipital lobe and bilateral frontal lobes.  Multiple chronic infarcts.  LDL 52.  Echocardiogram showed EF 30 to 35%, severe apical dyskinesis,  apical left ventricular aneurysm.  Patient has no focal deficit at this time.  Started on anticoagulation with warfarin with heparin bridging.  Target INR is 2-3.  Today INR is 2.4.  Heparin was discontinued.  Continue warfarin per pharmacy consultation.  Continue aspirin and statin.  3.  Hypertension-blood pressure stable, continue metoprolol succinate 50 mg daily.  4.  Uncontrolled type 2 diabetes mellitus-continue sliding scale insulin with NovoLog.  5. ESRD on hemodialysis/anemia of chronic disease/failed renal transplant-continue hemodialysis per nephrology.  Continue PhosLo, Renvela, Aranesp.  6. LV thrombus-continue anticoagulation with warfarin   CBG: Recent Labs  Lab 08/07/19 1649 08/07/19 1947 08/08/19 0011 08/08/19 0439 08/08/19 0747  GLUCAP 118* 130* 151* 150* 147*    CBC: Recent Labs  Lab 08/04/19 0500 08/05/19 0500 08/06/19 0420 08/07/19 0500 08/08/19 0500  WBC 10.1 8.6 9.6 9.4 8.9  HGB 8.9* 9.6* 9.0* 9.3* 8.5*  HCT 26.1* 28.0* 27.7* 28.7* 26.0*  MCV 100.0 100.0 104.1* 105.1* 104.0*  PLT 341 287 292 306 [id number]    Basic Metabolic Panel: Recent Labs  Lab 08/03/19 0500 08/04/19 0500 08/06/19 0420 08/07/19 0500 08/08/19 0500  NA 139 135 138 138 134*  K 4.2 4.6 5.0 5.5* 5.3*  CL 95* 94* 97* 96* 94*  CO2 26 23 26 24 25   GLUCOSE 151* 152* 153* 185* 153*  BUN 84* 106* 76* 102* 55*  CREATININE 9.62* 11.11* 9.25* 11.08* 7.56*  CALCIUM 7.5* 7.7* 7.8* 8.2* 7.7*  MG  --   --  2.4  --   --        DVT prophylaxis: Warfarin  Code Status: Full code  Family Communication: No family at bedside  Disposition Plan: likely skilled nursing facility when bed becomes available.        BMI  Estimated body mass index is 23.12 kg/m as calculated from the following:   Height as of this encounter: 5\' 11"  (1.803 m).   Weight as of this encounter: 75.2 kg.  Scheduled medications:  .  stroke: mapping our early stages of recovery book   Does not apply Once  .  allopurinol  100 mg Oral Daily  . aspirin  81 mg Oral Daily  . atorvastatin  40 mg Oral Daily  . calcium acetate  1,334 mg Oral TID WC  . Chlorhexidine Gluconate Cloth  6 each Topical Q0600  . darbepoetin (ARANESP) injection - DIALYSIS  100 mcg Intravenous Q Wed-HD  . dexamethasone (DECADRON) injection  6 mg Intravenous Q24H  . heparin  8,000 Units Dialysis Once in dialysis  . insulin aspart  0-6 Units Subcutaneous Q4H  . metoCLOPramide  5 mg Oral TID WC  . metoprolol succinate  50 mg Oral Daily  . sevelamer carbonate  2.4 g Oral TID WC  . sodium chloride flush  3 mL Intravenous Q12H  . warfarin  2.5 mg Oral ONCE-1800  . Warfarin - Pharmacist Dosing Inpatient   Does not apply q1800       Antibiotics:   Anti-infectives (From admission, onward)   Start     Dose/Rate Route Frequency Ordered Stop   07/31/19 1000  remdesivir 100 mg in sodium chloride 0.9 % 100 mL IVPB     100 mg 200 mL/hr over 30 Minutes Intravenous Daily 07/30/19 2217 08/04/19 0748   07/31/19 0800  ceFEPIme (MAXIPIME) 1 g in sodium chloride 0.9 % 100 mL IVPB  Status:  Discontinued     1 g 200 mL/hr over 30 Minutes Intravenous Every 24 hours 07/31/19 0748 07/31/19 0816   07/31/19 0800  vancomycin (VANCOREADY) IVPB 1500 mg/300 mL  Status:  Discontinued     1,500 mg 150 mL/hr over 120 Minutes Intravenous  Once 07/31/19 0748 07/31/19 0816   07/31/19 0748  vancomycin variable dose per unstable renal function (pharmacist dosing)  Status:  Discontinued      Does not apply See admin instructions 07/31/19 0748 07/31/19 0816   07/30/19 2230  remdesivir 200 mg in sodium chloride 0.9% 250 mL IVPB     200 mg 580 mL/hr over 30 Minutes Intravenous Once 07/30/19 2217 07/31/19 0045       Objective   Vitals:   08/07/19 1649 08/07/19 1949 08/08/19 0441 08/08/19 0800  BP: (!) 112/93 (!) 121/91 (!) 116/53 124/69  Pulse: 84 90 80 76  Resp: 19 16 16 13   Temp: 98.2 F (36.8 C) 98.8 F (37.1 C) 97.6 F (36.4 C)   TempSrc:  Oral Axillary Axillary   SpO2: 94% (!) 89% 91% 98%  Weight:      Height:        Intake/Output Summary (Last 24 hours) at 08/08/2019 1147 Last data filed at 08/07/2019 1349 Gross per 24 hour  Intake --  Output -351 ml  Net 351 ml   Filed Weights   08/07/19 0441 08/07/19 1000 08/07/19 1352  Weight: 74.9 kg 75.2 kg 75.2 kg     Physical Examination:  General-appears in no acute distress Heart-S1-S2, regular, no murmur auscultated Lungs-clear to auscultation bilaterally, no wheezing or crackles auscultated Abdomen-soft, nontender, no organomegaly Extremities-no edema in the lower extremities Neuro-alert, confused   Data Reviewed: I have personally reviewed  following labs and imaging studies   Recent Results (from the past 240 hour(s))  Blood Culture (routine x 2)     Status: None   Collection Time: 07/30/19  6:43 PM   Specimen: BLOOD  Result Value Ref Range Status   Specimen Description BLOOD SITE NOT SPECIFIED  Final   Special Requests   Final    BOTTLES DRAWN AEROBIC AND ANAEROBIC Blood Culture results may not be optimal due to an inadequate volume of blood received in culture bottles   Culture   Final    NO GROWTH 5 DAYS Performed at [facility], [place] [address] [place], [place] [address]    Report Status 08/04/2019 FINAL  Final  MRSA PCR Screening     Status: None   Collection Time: 08/02/19  8:43 AM   Specimen: Nasopharyngeal  Result Value Ref Range Status   MRSA by PCR NEGATIVE NEGATIVE Final    Comment:        The GeneXpert MRSA Assay (FDA approved for NASAL specimens only), is one component of a comprehensive MRSA colonization surveillance program. It is not intended to diagnose MRSA infection nor to guide or monitor treatment for MRSA infections. Performed at [facility], [place] [address] [place], [place] [address]      Liver Function Tests: Recent Labs  Lab 08/02/19 [id number] 08/03/19 0500 08/04/19 0500  AST 20 12* 11*  ALT 20 17 15    ALKPHOS 42 44 52  BILITOT 0.7 0.4 0.3  PROT 6.4* 6.3* 5.5*  ALBUMIN 2.6* 2.5* 2.4*   No results for input(s): LIPASE, AMYLASE in the last 168 hours. No results for input(s): AMMONIA in the last 168 hours.  Cardiac Enzymes: No results for input(s): CKTOTAL, CKMB, CKMBINDEX, TROPONINI in the last 168 hours. BNP (last 3 results) Recent Labs    07/30/19 1843  BNP 797.5*      Admission status: Inpatient: Based on patients clinical presentation and evaluation of above clinical data, I have made determination that patient meets Inpatient criteria at this time.   Oswald Hillock   [facility] If 7PM-7AM, please contact night-coverage at [email or web address], Office  [phone]  password [place]  08/08/2019, 11:47 AM  LOS: 9 days

## 2019-08-09 LAB — PROTIME-INR
INR: 3.1 — ABNORMAL HIGH (ref 0.8–1.2)
Prothrombin Time: 32 seconds — ABNORMAL HIGH (ref 11.4–15.2)

## 2019-08-09 LAB — GLUCOSE, CAPILLARY
Glucose-Capillary: 108 mg/dL — ABNORMAL HIGH (ref 70–99)
Glucose-Capillary: 147 mg/dL — ABNORMAL HIGH (ref 70–99)
Glucose-Capillary: 160 mg/dL — ABNORMAL HIGH (ref 70–99)
Glucose-Capillary: 190 mg/dL — ABNORMAL HIGH (ref 70–99)
Glucose-Capillary: 231 mg/dL — ABNORMAL HIGH (ref 70–99)
Glucose-Capillary: 237 mg/dL — ABNORMAL HIGH (ref 70–99)

## 2019-08-09 LAB — CBC
HCT: 24.7 % — ABNORMAL LOW (ref 39.0–52.0)
Hemoglobin: 8.1 g/dL — ABNORMAL LOW (ref 13.0–17.0)
MCH: 34.3 pg — ABNORMAL HIGH (ref 26.0–34.0)
MCHC: 32.8 g/dL (ref 30.0–36.0)
MCV: 104.7 fL — ABNORMAL HIGH (ref 80.0–100.0)
Platelets: 206 10*3/uL (ref 150–400)
RBC: 2.36 MIL/uL — ABNORMAL LOW (ref 4.22–5.81)
RDW: 16.6 % — ABNORMAL HIGH (ref 11.5–15.5)
WBC: 8.8 10*3/uL (ref 4.0–10.5)
nRBC: 0.2 % (ref 0.0–0.2)

## 2019-08-09 MED ORDER — CHLORHEXIDINE GLUCONATE CLOTH 2 % EX PADS
6.0000 | MEDICATED_PAD | Freq: Every day | CUTANEOUS | Status: DC
  Administered 2019-08-10 – 2019-08-12 (×3): 6 via TOPICAL

## 2019-08-09 MED ORDER — WARFARIN SODIUM 1 MG PO TABS
1.0000 mg | ORAL_TABLET | Freq: Once | ORAL | Status: AC
  Administered 2019-08-09: 1 mg via ORAL
  Filled 2019-08-09: qty 1

## 2019-08-09 NOTE — Progress Notes (Signed)
[name] for Warfarin Indication: h/o LV thrombus and new CVA   No Known Allergies  Patient Measurements: Height: 5\' 11"  (180.3 cm) Weight: 163 lb 9.3 oz (74.2 kg) IBW/kg (Calculated) : 75.3  Vital Signs: Temp: 97.8 F (36.6 C) (01/01 0815) Temp Source: Oral (01/01 0815) BP: 127/69 (01/01 0815) Pulse Rate: 89 (01/01 0815)  Labs: Recent Labs    08/07/19 0500 08/08/19 0500 08/09/19 0314  HGB 9.3* 8.5* 8.1*  HCT 28.7* 26.0* 24.7*  PLT 306 238 206  LABPROT 26.3* 29.6* 32.0*  INR 2.4* 2.8* 3.1*  HEPARINUNFRC 0.31  --   --   CREATININE 11.08* 7.56*  --     Estimated Creatinine Clearance: 10.1 mL/min (A) (by C-G formula based on SCr of 7.56 mg/dL (H)).   Assessment: 69 y.o. 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 Last Eliquis dose ~1000 on 12/22. Patient is currently on heparin drip at 700 units/hr for new CVA (MRI confirmed).   Given acute CVA and risk for hemorrhagic conversion, will set aPTT and heparin level at lower end of goal as listed below. Baseline heparin level before starting infusion was >2.20, indicating that apixaban is still influencing heparin levels at this point. Due to recent Apixiaban, will monitor anticoagulation using aPTT initially.  Heparin gtt d/c 12/30.  INR today is supratherapeutic at 3.1. Hgb 8.1. Plt 206. No reported bleeding   Goal of Therapy:  INR goal 2 - 3 Monitor platelets by anticoagulation protocol: Yes   Plan:  Warfarin 1mg  tonight  Daily INR, s/s bleeding  Cristela Felt, PharmD PGY1 Pharmacy Resident [employer]: [phone]  08/09/2019 11:28 AM

## 2019-08-09 NOTE — TOC Progression Note (Signed)
Transition of Care (TOC) - Progression Note    Patient Details  Name: Jonathan Busby Sr. MRN: [id number] Date of Birth: 10/30/1950  Transition of Care [facility]) CM/SW [place], [place] Phone Number: 08/09/2019, 12:20 PM  Clinical Narrative:     Patient currently doesn't have any current bed offers. CSW will continue to follow and assist with discharge planning.   Expected Discharge Plan: [place]    Expected Discharge Plan and Services Expected Discharge Plan: [place] arrangements for the past 2 months: Single Family Home                                       Social Determinants of Health (SDOH) Interventions    Readmission Risk Interventions No flowsheet data found.

## 2019-08-09 NOTE — Progress Notes (Signed)
[facility]  PROGRESS NOTE  Jonathan Winklepleck Sr. [id number] DOB: [birth year] DOA: 07/30/2019 PCP: Marton Redwood, MD   Brief HPI:   69 year old male with history of ESRD on hemodialysis, Tuesday Thursday and Saturday history of renal transplant, history of stroke, hypertension, type 2 diabetes mellitus, left ventricular thrombus came to ED with difficulty swallowing.  His wife noticed him having difficulty swallowing for about 2 weeks.  He was tested positive for COVID-19 on December 16.  Chest x-ray showed right upper and right lower lobe, left lower lobe interstitial infiltrate. Patient was noted with diagnosis of acute hypoxic respiratory failure due to SARS COVID-19 viral pneumonia. Further work-up with brain MRI showed small acute infarct in the left pons, left occipital lobe and bilateral frontal lobes.  Initially failed swallow evaluation but fortunately swallowing improved, patient placed on dysphagia 1 diet with aspiration precautions.  Oral anticoagulation was resumed after discussion question with Dr. Leonie Man from neurology.    Subjective   Patient seen and examined, denies any pain.  Continues to be confused.   Assessment/Plan:     1. Acute hypoxic respiratory failure due to SARS COVID-19 viral pneumonia-patient is requiring 2 L/min of oxygen via nasal cannula, O2 sats 96%, respiratory rate 19/min.  Patient has completed 5 days of remdesivir treatment.  Currently on Decadron 6 mg IV every 24 hours.  Day #9/10    Inflammatory markers are improving, CRP down to 3.2, ferritin slowly improving.    COVID-19 Labs  Recent Labs    08/07/19 1446  FERRITIN 2,184*  CRP 3.2*    Lab Results  Component Value Date   SARSCOV2NAA POSITIVE (A) 07/24/2019    2.  Acute CVA-possible embolic phenomena, small acute infarct in left pons, left occipital lobe and bilateral frontal lobes.  Multiple chronic infarcts.  LDL 52.  Echocardiogram showed EF 30 to 35%, severe apical dyskinesis,  apical left ventricular aneurysm.  Patient has no focal deficit at this time.  Started on anticoagulation with warfarin with heparin bridging.  Target INR is 2-3.  Today INR is 2.4.  Heparin was discontinued.  Continue warfarin per pharmacy consultation.  Continue aspirin and statin.  3.  Hypertension-blood pressure stable, continue metoprolol succinate 50 mg daily.  4.  Uncontrolled type 2 diabetes mellitus-continue sliding scale insulin with NovoLog.  CBG is fairly well controlled.  5. ESRD on hemodialysis/anemia of chronic disease/failed renal transplant-continue hemodialysis per nephrology.  Continue PhosLo, Renvela, Aranesp.  6. LV thrombus-continue anticoagulation with warfarin   CBG: Recent Labs  Lab 08/08/19 1948 08/09/19 0013 08/09/19 0418 08/09/19 0757 08/09/19 1138  GLUCAP 220* 160* 108* 147* 231*    CBC: Recent Labs  Lab 08/05/19 0500 08/06/19 0420 08/07/19 0500 08/08/19 0500 08/09/19 0314  WBC 8.6 9.6 9.4 8.9 8.8  HGB 9.6* 9.0* 9.3* 8.5* 8.1*  HCT 28.0* 27.7* 28.7* 26.0* 24.7*  MCV 100.0 104.1* 105.1* 104.0* 104.7*  PLT 287 292 306 238 [id number]    Basic Metabolic Panel: Recent Labs  Lab 08/03/19 0500 08/04/19 0500 08/06/19 0420 08/07/19 0500 08/08/19 0500  NA 139 135 138 138 134*  K 4.2 4.6 5.0 5.5* 5.3*  CL 95* 94* 97* 96* 94*  CO2 26 23 26 24 25   GLUCOSE 151* 152* 153* 185* 153*  BUN 84* 106* 76* 102* 55*  CREATININE 9.62* 11.11* 9.25* 11.08* 7.56*  CALCIUM 7.5* 7.7* 7.8* 8.2* 7.7*  MG  --   --  2.4  --   --        DVT  prophylaxis: Warfarin  Code Status: Full code  Family Communication: No family at bedside  Disposition Plan: likely skilled nursing facility when bed becomes available.        BMI  Estimated body mass index is 22.81 kg/m as calculated from the following:   Height as of this encounter: 5\' 11"  (1.803 m).   Weight as of this encounter: 74.2 kg.  Scheduled medications:  .  stroke: mapping our early stages of  recovery book   Does not apply Once  . allopurinol  100 mg Oral Daily  . aspirin  81 mg Oral Daily  . atorvastatin  40 mg Oral Daily  . calcium acetate  1,334 mg Oral TID WC  . Chlorhexidine Gluconate Cloth  6 each Topical Q0600  . darbepoetin (ARANESP) injection - DIALYSIS  100 mcg Intravenous Q Wed-HD  . heparin  8,000 Units Dialysis Once in dialysis  . insulin aspart  0-6 Units Subcutaneous Q4H  . metoCLOPramide  5 mg Oral TID WC  . metoprolol succinate  50 mg Oral Daily  . sevelamer carbonate  2.4 g Oral TID WC  . sodium chloride flush  3 mL Intravenous Q12H  . warfarin  1 mg Oral ONCE-1800  . Warfarin - Pharmacist Dosing Inpatient   Does not apply q1800       Antibiotics:   Anti-infectives (From admission, onward)   Start     Dose/Rate Route Frequency Ordered Stop   07/31/19 1000  remdesivir 100 mg in sodium chloride 0.9 % 100 mL IVPB     100 mg 200 mL/hr over 30 Minutes Intravenous Daily 07/30/19 2217 08/04/19 0748   07/31/19 0800  ceFEPIme (MAXIPIME) 1 g in sodium chloride 0.9 % 100 mL IVPB  Status:  Discontinued     1 g 200 mL/hr over 30 Minutes Intravenous Every 24 hours 07/31/19 0748 07/31/19 0816   07/31/19 0800  vancomycin (VANCOREADY) IVPB 1500 mg/300 mL  Status:  Discontinued     1,500 mg 150 mL/hr over 120 Minutes Intravenous  Once 07/31/19 0748 07/31/19 0816   07/31/19 0748  vancomycin variable dose per unstable renal function (pharmacist dosing)  Status:  Discontinued      Does not apply See admin instructions 07/31/19 0748 07/31/19 0816   07/30/19 2230  remdesivir 200 mg in sodium chloride 0.9% 250 mL IVPB     200 mg 580 mL/hr over 30 Minutes Intravenous Once 07/30/19 2217 07/31/19 0045       Objective   Vitals:   08/09/19 0419 08/09/19 0700 08/09/19 0815 08/09/19 1300  BP: (!) 125/59  127/69   Pulse: 85  89   Resp: 16  18   Temp: 98.3 F (36.8 C)  97.8 F (36.6 C) 97.8 F (36.6 C)  TempSrc: Oral  Oral Axillary  SpO2: 96%     Weight:  74.2 kg     Height:        Intake/Output Summary (Last 24 hours) at 08/09/2019 1427 Last data filed at 08/09/2019 0900 Gross per 24 hour  Intake 3 ml  Output 0 ml  Net 3 ml   Filed Weights   08/07/19 1000 08/07/19 1352 08/09/19 0700  Weight: 75.2 kg 75.2 kg 74.2 kg     Physical Examination:  General-appears in no acute distress Heart-S1-S2, regular, no murmur auscultated Lungs-clear to auscultation bilaterally, no wheezing or crackles auscultated Abdomen-soft, nontender, no organomegaly Extremities-no edema in the lower extremities Neuro-alert, confused   Data Reviewed: I have personally reviewed following labs and  imaging studies   Recent Results (from the past 240 hour(s))  Blood Culture (routine x 2)     Status: None   Collection Time: 07/30/19  6:43 PM   Specimen: BLOOD  Result Value Ref Range Status   Specimen Description BLOOD SITE NOT SPECIFIED  Final   Special Requests   Final    BOTTLES DRAWN AEROBIC AND ANAEROBIC Blood Culture results may not be optimal due to an inadequate volume of blood received in culture bottles   Culture   Final    NO GROWTH 5 DAYS Performed at [facility], [place] [address], [place], [place] [address]    Report Status 08/04/2019 FINAL  Final  MRSA PCR Screening     Status: None   Collection Time: 08/02/19  8:43 AM   Specimen: Nasopharyngeal  Result Value Ref Range Status   MRSA by PCR NEGATIVE NEGATIVE Final    Comment:        The GeneXpert MRSA Assay (FDA approved for NASAL specimens only), is one component of a comprehensive MRSA colonization surveillance program. It is not intended to diagnose MRSA infection nor to guide or monitor treatment for MRSA infections. Performed at [facility], [place] [address], [place], [place] [address]      Liver Function Tests: Recent Labs  Lab 08/03/19 0500 08/04/19 0500  AST 12* 11*  ALT 17 15  ALKPHOS 44 52  BILITOT 0.4 0.3  PROT 6.3* 5.5*  ALBUMIN 2.5* 2.4*   No results for  input(s): LIPASE, AMYLASE in the last 168 hours. No results for input(s): AMMONIA in the last 168 hours.  Cardiac Enzymes: No results for input(s): CKTOTAL, CKMB, CKMBINDEX, TROPONINI in the last 168 hours. BNP (last 3 results) Recent Labs    07/30/19 1843  BNP 797.5*      Admission status: Inpatient: Based on patients clinical presentation and evaluation of above clinical data, I have made determination that patient meets Inpatient criteria at this time.   Oswald Hillock   [facility] If 7PM-7AM, please contact night-coverage at [email or web address], Office  [phone]  password [email or web address]  08/09/2019, 2:27 PM  LOS: 10 days

## 2019-08-09 NOTE — Progress Notes (Signed)
  Jonathan [name] Progress Note   Subjective:   Patient not examined today directly given COVID-19 + status, utilizing exam of the primary team and observations of RN's.    Objective Vitals:   08/09/19 0419 08/09/19 0700 08/09/19 0815 08/09/19 1300  BP: (!) 125/59  127/69   Pulse: 85  89   Resp: 16  18   Temp: 98.3 F (36.8 C)  97.8 F (36.6 C) 97.8 F (36.6 C)  TempSrc: Oral  Oral Axillary  SpO2: 96%     Weight:  74.2 kg    Height:        Physical Exam   Patient not examined today directly given COVID-19 + status, utilizing exam of the primary team and observations of RN's.     Dialysis OP: Currently GO TTS2 - previously [facility]  4h 75min  400/800   80kg  2/2 bath R AVF  Hep 8000 venofer 50/week + Mircera 75 last 12/17  parsabiv 7.5, no calcitriol      Assessment/Plan: 1. COVID-19 PNA - Remdesivir/Decadron per primary.  2. Acute embolic CVA - multiple acute infarcts/prior LV thrombus/ hx old strokes by imaging - Stroke team signed off 12/25. Heparin/warfarin bridge per pharmacy. Will need SNF at discharge 3. ESRD - HD TTS. Next HD tomorrow 4. HTN/volume-  BP's stable. On metoprolol 50. Under dry wt 5kg, no vol^, did not tolerate attempt at UF early this week. Keep even on HD.  5. Anemia-  Hgb 8-9s.  Aranesp 100 q Wed (next dose 12/30)  6. Metabolic Bone Disease- Corr Ca ok. On PhosLo + Renvela binder. Check Phos with next HD.  7. Nutrition - DYS 1 diet per SLP.    Kelly Splinter, MD 08/09/2019, 4:50 PM    Medications: . sodium chloride    . sodium chloride    . sodium chloride    . sodium chloride     .  stroke: mapping our early stages of recovery book   Does not apply Once  . allopurinol  100 mg Oral Daily  . aspirin  81 mg Oral Daily  . atorvastatin  40 mg Oral Daily  . calcium acetate  1,334 mg Oral TID WC  . Chlorhexidine Gluconate Cloth  6 each Topical Q0600  . darbepoetin (ARANESP) injection - DIALYSIS  100 mcg Intravenous Q Wed-HD  . heparin   8,000 Units Dialysis Once in dialysis  . insulin aspart  0-6 Units Subcutaneous Q4H  . metoCLOPramide  5 mg Oral TID WC  . metoprolol succinate  50 mg Oral Daily  . sevelamer carbonate  2.4 g Oral TID WC  . sodium chloride flush  3 mL Intravenous Q12H  . warfarin  1 mg Oral ONCE-1800  . Warfarin - Pharmacist Dosing Inpatient   Does not apply [phone]

## 2019-08-10 LAB — CBC
HCT: 27.9 % — ABNORMAL LOW (ref 39.0–52.0)
Hemoglobin: 9 g/dL — ABNORMAL LOW (ref 13.0–17.0)
MCH: 34.1 pg — ABNORMAL HIGH (ref 26.0–34.0)
MCHC: 32.3 g/dL (ref 30.0–36.0)
MCV: 105.7 fL — ABNORMAL HIGH (ref 80.0–100.0)
Platelets: 230 10*3/uL (ref 150–400)
RBC: 2.64 MIL/uL — ABNORMAL LOW (ref 4.22–5.81)
RDW: 16.6 % — ABNORMAL HIGH (ref 11.5–15.5)
WBC: 9.8 10*3/uL (ref 4.0–10.5)
nRBC: 0 % (ref 0.0–0.2)

## 2019-08-10 LAB — RENAL FUNCTION PANEL
Albumin: 2.5 g/dL — ABNORMAL LOW (ref 3.5–5.0)
Anion gap: 17 — ABNORMAL HIGH (ref 5–15)
BUN: 84 mg/dL — ABNORMAL HIGH (ref 8–23)
CO2: 25 mmol/L (ref 22–32)
Calcium: 8.1 mg/dL — ABNORMAL LOW (ref 8.9–10.3)
Chloride: 97 mmol/L — ABNORMAL LOW (ref 98–111)
Creatinine, Ser: 9.4 mg/dL — ABNORMAL HIGH (ref 0.61–1.24)
GFR calc Af Amer: 6 mL/min — ABNORMAL LOW (ref >60)
GFR calc non Af Amer: 5 mL/min — ABNORMAL LOW (ref >60)
Glucose, Bld: 157 mg/dL — ABNORMAL HIGH (ref 70–99)
Phosphorus: 4.5 mg/dL (ref 2.5–4.6)
Potassium: 4.8 mmol/L (ref 3.5–5.1)
Sodium: 139 mmol/L (ref 135–145)

## 2019-08-10 LAB — GLUCOSE, CAPILLARY
Glucose-Capillary: 127 mg/dL — ABNORMAL HIGH (ref 70–99)
Glucose-Capillary: 163 mg/dL — ABNORMAL HIGH (ref 70–99)
Glucose-Capillary: 168 mg/dL — ABNORMAL HIGH (ref 70–99)
Glucose-Capillary: 169 mg/dL — ABNORMAL HIGH (ref 70–99)
Glucose-Capillary: 212 mg/dL — ABNORMAL HIGH (ref 70–99)
Glucose-Capillary: 221 mg/dL — ABNORMAL HIGH (ref 70–99)
Glucose-Capillary: 274 mg/dL — ABNORMAL HIGH (ref 70–99)
Glucose-Capillary: 535 mg/dL (ref 70–99)

## 2019-08-10 LAB — PROTIME-INR
INR: 2.6 — ABNORMAL HIGH (ref 0.8–1.2)
Prothrombin Time: 27.7 seconds — ABNORMAL HIGH (ref 11.4–15.2)

## 2019-08-10 MED ORDER — INSULIN GLARGINE 100 UNIT/ML ~~LOC~~ SOLN
10.0000 [IU] | Freq: Every day | SUBCUTANEOUS | Status: DC
  Administered 2019-08-10: 10 [IU] via SUBCUTANEOUS
  Filled 2019-08-10 (×2): qty 0.1

## 2019-08-10 MED ORDER — WARFARIN SODIUM 4 MG PO TABS
4.0000 mg | ORAL_TABLET | Freq: Once | ORAL | Status: AC
  Administered 2019-08-10: 4 mg via ORAL
  Filled 2019-08-10: qty 1

## 2019-08-10 NOTE — Progress Notes (Signed)
  [name] Progress Note   Subjective:  Patient seen in his room. Answers most questions w/ "yeah", gets angry when pressed for details  Objective Vitals:   08/10/19 0400 08/10/19 0747 08/10/19 0940 08/10/19 1156  BP: 127/68  105/71   Pulse: 81  87   Resp: 15     Temp: 97.7 F (36.5 C) 97.8 F (36.6 C)  97.8 F (36.6 C)  TempSrc: Oral Oral  Oral  SpO2: 95%     Weight:      Height:        Physical Exam nasal O2, no distress   No jvd  chest no rales or rhonchi   Cor reg no RG   Abd soft ntnd    RUA AVF placed 10.1.20  Moves legs bilat 2-3/5, not using R arm at all, moves L arm okay   Alert, poor cognition, O x self only, follows simple commands   Dialysis OP: Currently GO TTS2 - previously [facility]  4h 69min  400/800   80kg  2/2 bath R AVF  Hep 8000 venofer 50/week + Mircera 75 last 12/17  parsabiv 7.5, no calcitriol      Assessment/Plan: 1. COVID-19 PNA - Remdesivir/Decadron per primary.  2. Acute embolic CVA - multiple acute infarcts/prior LV thrombus/ hx old strokes. Stroke team signed off 12/25. Appears to have sig cognitive / memory issues, vs poss aphasia.  3. ESRD - HD TTS. HD today on sched 4. HTN/volume-  BP's stable on metoprolol 50qd only. Did not tolerate attempt at UF early this week. Keeping even on HD.  5. Anemia-  Hgb 8-9s.  Aranesp 100 q Wed (next dose 12/30)  6. Metabolic Bone Disease- Corr Ca ok. On PhosLo + Renvela binder. Check Phos with next HD.  7. Nutrition - DYS 1 diet per SLP 8. Prognosis - sig cognitive issues which may be mostly long-term, not sure.  Would consider GOC / PC consult w/ mult comorbidities.    Kelly Splinter, MD 08/10/2019, 12:31 PM    Medications: . sodium chloride    . sodium chloride    . sodium chloride    . sodium chloride     .  stroke: mapping our early stages of recovery book   Does not apply Once  . allopurinol  100 mg Oral Daily  . aspirin  81 mg Oral Daily  . atorvastatin  40 mg Oral Daily  .  calcium acetate  1,334 mg Oral TID WC  . Chlorhexidine Gluconate Cloth  6 each Topical Q0600  . Chlorhexidine Gluconate Cloth  6 each Topical Q0600  . darbepoetin (ARANESP) injection - DIALYSIS  100 mcg Intravenous Q Wed-HD  . heparin  8,000 Units Dialysis Once in dialysis  . insulin aspart  0-6 Units Subcutaneous Q4H  . metoCLOPramide  5 mg Oral TID WC  . metoprolol succinate  50 mg Oral Daily  . sevelamer carbonate  2.4 g Oral TID WC  . sodium chloride flush  3 mL Intravenous Q12H  . warfarin  4 mg Oral ONCE-1800  . Warfarin - Pharmacist Dosing Inpatient   Does not apply [phone]

## 2019-08-10 NOTE — Progress Notes (Addendum)
[facility]  PROGRESS NOTE  Jonathan Glorioso Sr. [id number] DOB: Dec 23, 1950 DOA: 07/30/2019 PCP: Marton Redwood, MD   Brief HPI:   69 year old male with history of ESRD on hemodialysis, Tuesday Thursday and Saturday history of renal transplant, history of stroke, hypertension, type 2 diabetes mellitus, left ventricular thrombus came to ED with difficulty swallowing.  His wife noticed him having difficulty swallowing for about 2 weeks.  He was tested positive for COVID-19 on December 16.  Chest x-ray showed right upper and right lower lobe, left lower lobe interstitial infiltrate. Patient was noted with diagnosis of acute hypoxic respiratory failure due to SARS COVID-19 viral pneumonia. Further work-up with brain MRI showed small acute infarct in the left pons, left occipital lobe and bilateral frontal lobes.  Initially failed swallow evaluation but fortunately swallowing improved, patient placed on dysphagia 1 diet with aspiration precautions.  Oral anticoagulation was resumed after discussion question with Dr. Leonie Man from neurology.    Subjective   Patient seen and examined, continues to be confused.   Assessment/Plan:     1. Acute hypoxic respiratory failure due to SARS COVID-19 viral pneumonia-patient is requiring 2 L/min of oxygen via nasal cannula, O2 sats 94%, respiratory rate 17/min.  Patient has completed 5 days of remdesivir treatment.  Completed 10 days of IV Decadron treatment.        Inflammatory markers are improving, CRP down to 3.2, ferritin slowly improving.   COVID-19 Labs  Recent Labs    08/07/19 1446  FERRITIN 2,184*  CRP 3.2*    Lab Results  Component Value Date   SARSCOV2NAA POSITIVE (A) 07/24/2019    2.  Acute CVA-possible embolic phenomena, small acute infarct in left pons, left occipital lobe and bilateral frontal lobes.  Multiple chronic infarcts.  LDL 52.  Echocardiogram showed EF 30 to 35%, severe apical dyskinesis, apical left ventricular  aneurysm.  Patient has no focal deficit at this time.  Started on anticoagulation with warfarin with heparin bridging.  Target INR is 2-3.    Heparin was discontinued.  Continue warfarin per pharmacy consultation.  Continue aspirin and statin.  3.  Hypertension-blood pressure stable, continue metoprolol succinate 50 mg daily.  4.  Uncontrolled type 2 diabetes mellitus-continue sliding scale insulin with NovoLog.  CBG is mildly elevated, will start Lantus 10 units subcu daily.  5. ESRD on hemodialysis/anemia of chronic disease/failed renal transplant-continue hemodialysis per nephrology.  Continue PhosLo, Renvela, Aranesp.  6. LV thrombus-continue anticoagulation with warfarin   CBG: Recent Labs  Lab 08/10/19 0001 08/10/19 0439 08/10/19 0444 08/10/19 0745 08/10/19 1154  GLUCAP 168* 535* 221* 169* 274*    CBC: Recent Labs  Lab 08/06/19 0420 08/07/19 0500 08/08/19 0500 08/09/19 0314 08/10/19 0500  WBC 9.6 9.4 8.9 8.8 9.8  HGB 9.0* 9.3* 8.5* 8.1* 9.0*  HCT 27.7* 28.7* 26.0* 24.7* 27.9*  MCV 104.1* 105.1* 104.0* 104.7* 105.7*  PLT 292 306 238 206 [id number]    Basic Metabolic Panel: Recent Labs  Lab 08/04/19 0500 08/06/19 0420 08/07/19 0500 08/08/19 0500 08/10/19 0500  NA 135 138 138 134* 139  K 4.6 5.0 5.5* 5.3* 4.8  CL 94* 97* 96* 94* 97*  CO2 23 26 24 25 25   GLUCOSE 152* 153* 185* 153* 157*  BUN 106* 76* 102* 55* 84*  CREATININE 11.11* 9.25* 11.08* 7.56* 9.40*  CALCIUM 7.7* 7.8* 8.2* 7.7* 8.1*  MG  --  2.4  --   --   --   PHOS  --   --   --   --  4.5       DVT prophylaxis: Warfarin  Code Status: Full code  Family Communication: No family at bedside  Disposition Plan: likely skilled nursing facility when bed becomes available.     BMI  Estimated body mass index is 22.81 kg/m as calculated from the following:   Height as of this encounter: 5\' 11"  (1.803 m).   Weight as of this encounter: 74.2 kg.  Scheduled medications:  .  stroke: mapping our early  stages of recovery book   Does not apply Once  . allopurinol  100 mg Oral Daily  . aspirin  81 mg Oral Daily  . atorvastatin  40 mg Oral Daily  . calcium acetate  1,334 mg Oral TID WC  . Chlorhexidine Gluconate Cloth  6 each Topical Q0600  . Chlorhexidine Gluconate Cloth  6 each Topical Q0600  . darbepoetin (ARANESP) injection - DIALYSIS  100 mcg Intravenous Q Wed-HD  . heparin  8,000 Units Dialysis Once in dialysis  . insulin aspart  0-6 Units Subcutaneous Q4H  . metoCLOPramide  5 mg Oral TID WC  . metoprolol succinate  50 mg Oral Daily  . sevelamer carbonate  2.4 g Oral TID WC  . sodium chloride flush  3 mL Intravenous Q12H  . warfarin  4 mg Oral ONCE-1800  . Warfarin - Pharmacist Dosing Inpatient   Does not apply q1800       Antibiotics:   Anti-infectives (From admission, onward)   Start     Dose/Rate Route Frequency Ordered Stop   07/31/19 1000  remdesivir 100 mg in sodium chloride 0.9 % 100 mL IVPB     100 mg 200 mL/hr over 30 Minutes Intravenous Daily 07/30/19 2217 08/04/19 0748   07/31/19 0800  ceFEPIme (MAXIPIME) 1 g in sodium chloride 0.9 % 100 mL IVPB  Status:  Discontinued     1 g 200 mL/hr over 30 Minutes Intravenous Every 24 hours 07/31/19 0748 07/31/19 0816   07/31/19 0800  vancomycin (VANCOREADY) IVPB 1500 mg/300 mL  Status:  Discontinued     1,500 mg 150 mL/hr over 120 Minutes Intravenous  Once 07/31/19 0748 07/31/19 0816   07/31/19 0748  vancomycin variable dose per unstable renal function (pharmacist dosing)  Status:  Discontinued      Does not apply See admin instructions 07/31/19 0748 07/31/19 0816   07/30/19 2230  remdesivir 200 mg in sodium chloride 0.9% 250 mL IVPB     200 mg 580 mL/hr over 30 Minutes Intravenous Once 07/30/19 2217 07/31/19 0045       Objective   Vitals:   08/10/19 0747 08/10/19 0940 08/10/19 1156 08/10/19 1200  BP:  105/71  107/66  Pulse:  87  83  Resp:    17  Temp: 97.8 F (36.6 C)  97.8 F (36.6 C)   TempSrc: Oral  Oral    SpO2:    94%  Weight:      Height:        Intake/Output Summary (Last 24 hours) at 08/10/2019 1425 Last data filed at 08/10/2019 0946 Gross per 24 hour  Intake 213 ml  Output --  Net 213 ml   Filed Weights   08/07/19 1000 08/07/19 1352 08/09/19 0700  Weight: 75.2 kg 75.2 kg 74.2 kg     Physical Examination:  General-appears in no acute distress Heart-S1-S2, regular, no murmur auscultated Lungs-clear to auscultation bilaterally, no wheezing or crackles auscultated Abdomen-soft, nontender, no organomegaly Extremities-no edema in the lower extremities Neuro-alert, not oriented x2  Data Reviewed: I have personally reviewed following labs and imaging studies   Recent Results (from the past 240 hour(s))  MRSA PCR Screening     Status: None   Collection Time: 08/02/19  8:43 AM   Specimen: Nasopharyngeal  Result Value Ref Range Status   MRSA by PCR NEGATIVE NEGATIVE Final    Comment:        The GeneXpert MRSA Assay (FDA approved for NASAL specimens only), is one component of a comprehensive MRSA colonization surveillance program. It is not intended to diagnose MRSA infection nor to guide or monitor treatment for MRSA infections. Performed at [facility], [place] [address], [place], [place] [address]      Liver Function Tests: Recent Labs  Lab 08/04/19 0500 08/10/19 0500  AST 11*  --   ALT 15  --   ALKPHOS 52  --   BILITOT 0.3  --   PROT 5.5*  --   ALBUMIN 2.4* 2.5*   No results for input(s): LIPASE, AMYLASE in the last 168 hours. No results for input(s): AMMONIA in the last 168 hours.  Cardiac Enzymes: No results for input(s): CKTOTAL, CKMB, CKMBINDEX, TROPONINI in the last 168 hours. BNP (last 3 results) Recent Labs    07/30/19 1843  BNP 797.5*      Admission status: Inpatient: Based on patients clinical presentation and evaluation of above clinical data, I have made determination that patient meets Inpatient criteria at this time.   Oswald Hillock   [facility] If 7PM-7AM, please contact night-coverage at [email or web address], Office  [phone]  password [email or web address]  08/10/2019, 2:25 PM  LOS: 11 days

## 2019-08-10 NOTE — Progress Notes (Signed)
       Show:Clear all [x] Manual[] Template[x] Copied  Added by: [x] Kennon Rounds, RN  [] Hover for details Kern Alberta, RN called and notified that the patient's HD tx has been moved to 08/11/19.

## 2019-08-10 NOTE — Progress Notes (Signed)
[name] for Warfarin Indication: h/o LV thrombus and new CVA   No Known Allergies  Patient Measurements: Height: 5\' 11"  (180.3 cm) Weight: 163 lb 9.3 oz (74.2 kg) IBW/kg (Calculated) : 75.3  Vital Signs: Temp: 97.8 F (36.6 C) (01/02 0747) Temp Source: Oral (01/02 0747) BP: 105/71 (01/02 0940) Pulse Rate: 87 (01/02 0940)  Labs: Recent Labs    08/08/19 0500 08/09/19 0314 08/10/19 0500  HGB 8.5* 8.1* 9.0*  HCT 26.0* 24.7* 27.9*  PLT 238 206 230  LABPROT 29.6* 32.0* 27.7*  INR 2.8* 3.1* 2.6*  CREATININE 7.56*  --  9.40*    Estimated Creatinine Clearance: 8.1 mL/min (A) (by C-G formula based on SCr of 9.4 mg/dL (H)).   Jonathan [name]: 69 y.o. male admitted with acute respiratory failure, hx LV thrombus on Eliquis PTA]. Last Eliquis dose ~1000 on 12/22. Patient transitioned to heparin on admission with lower goals targeted due to new CVA (MRI confirmed) and risk of hemorrhagic conversion. Heparin infusion discontinued on 12/30 and patient transitioned to warfarin.   Pharmacy consulted to dose warfarin for LV thrombus. INR today is therapeutic at 2.6. Hgb 9.0. Plt 230. No reported bleeding   Goal of Therapy:  INR goal 2 - 3 Monitor platelets by anticoagulation protocol: Yes   Plan:  Warfarin 4mg  tonight  Daily INR, s/s bleeding Follow up ability to educate family member on warfarin - patient's son listed in [facility] [name] did not answer yesterday and today  Jonathan [name], PharmD PGY1 Pharmacy Resident [employer]: [phone]  08/10/2019 9:52 AM

## 2019-08-11 ENCOUNTER — Encounter (HOSPITAL_COMMUNITY): Payer: Self-pay | Admitting: Internal Medicine

## 2019-08-11 LAB — CBC
HCT: 25.3 % — ABNORMAL LOW (ref 39.0–52.0)
Hemoglobin: 8.2 g/dL — ABNORMAL LOW (ref 13.0–17.0)
MCH: 34.5 pg — ABNORMAL HIGH (ref 26.0–34.0)
MCHC: 32.4 g/dL (ref 30.0–36.0)
MCV: 106.3 fL — ABNORMAL HIGH (ref 80.0–100.0)
Platelets: 214 10*3/uL (ref 150–400)
RBC: 2.38 MIL/uL — ABNORMAL LOW (ref 4.22–5.81)
RDW: 16.5 % — ABNORMAL HIGH (ref 11.5–15.5)
WBC: 8.9 10*3/uL (ref 4.0–10.5)
nRBC: 0 % (ref 0.0–0.2)

## 2019-08-11 LAB — PROTIME-INR
INR: 2.6 — ABNORMAL HIGH (ref 0.8–1.2)
Prothrombin Time: 27.9 seconds — ABNORMAL HIGH (ref 11.4–15.2)

## 2019-08-11 LAB — GLUCOSE, CAPILLARY
Glucose-Capillary: 100 mg/dL — ABNORMAL HIGH (ref 70–99)
Glucose-Capillary: 116 mg/dL — ABNORMAL HIGH (ref 70–99)
Glucose-Capillary: 145 mg/dL — ABNORMAL HIGH (ref 70–99)
Glucose-Capillary: 156 mg/dL — ABNORMAL HIGH (ref 70–99)
Glucose-Capillary: 63 mg/dL — ABNORMAL LOW (ref 70–99)
Glucose-Capillary: 83 mg/dL (ref 70–99)
Glucose-Capillary: 84 mg/dL (ref 70–99)
Glucose-Capillary: 92 mg/dL (ref 70–99)

## 2019-08-11 MED ORDER — INSULIN GLARGINE 100 UNIT/ML ~~LOC~~ SOLN
5.0000 [IU] | Freq: Every day | SUBCUTANEOUS | Status: DC
  Administered 2019-08-11: 5 [IU] via SUBCUTANEOUS
  Filled 2019-08-11: qty 0.05

## 2019-08-11 MED ORDER — WARFARIN SODIUM 2.5 MG PO TABS
2.5000 mg | ORAL_TABLET | Freq: Once | ORAL | Status: AC
  Administered 2019-08-11: 2.5 mg via ORAL
  Filled 2019-08-11: qty 1

## 2019-08-11 MED ORDER — IPRATROPIUM BROMIDE HFA 17 MCG/ACT IN AERS
2.0000 | INHALATION_SPRAY | Freq: Three times a day (TID) | RESPIRATORY_TRACT | Status: DC
  Administered 2019-08-11 – 2019-08-16 (×14): 2 via RESPIRATORY_TRACT
  Filled 2019-08-11: qty 12.9

## 2019-08-11 MED ORDER — METOPROLOL SUCCINATE ER 25 MG PO TB24
25.0000 mg | ORAL_TABLET | Freq: Every day | ORAL | Status: DC
  Administered 2019-08-12 – 2019-08-21 (×9): 25 mg via ORAL
  Filled 2019-08-11 (×10): qty 1

## 2019-08-11 MED ORDER — LEVALBUTEROL TARTRATE 45 MCG/ACT IN AERO
2.0000 | INHALATION_SPRAY | Freq: Three times a day (TID) | RESPIRATORY_TRACT | Status: DC
  Administered 2019-08-11 – 2019-08-16 (×14): 2 via RESPIRATORY_TRACT
  Filled 2019-08-11: qty 15

## 2019-08-11 NOTE — Progress Notes (Signed)
PROGRESS NOTE  Kassidy Koehnke Sr. [id number] DOB: 1951/04/01 DOA: 07/30/2019 PCP: Marton Redwood, MD  HPI/Recap of past 65 hours: 69 year old male with history of ESRD on hemodialysis, Tuesday Thursday and Saturday history of renal transplant, history of stroke, hypertension, type 2 diabetes mellitus, left ventricular thrombus came to ED with difficulty swallowing.  His wife noticed him having difficulty swallowing for about 2 weeks.  He was tested positive for COVID-19 on December 16.  Chest x-ray showed right upper and right lower lobe, left lower lobe interstitial infiltrate. Patient was noted with diagnosis of acute hypoxic respiratory failure due to SARS COVID-19 viral pneumonia. Further work-up with brain MRI showed small acute infarct in the left pons, left occipital lobe and bilateral frontal lobes.  Initially failed swallow evaluation but fortunately swallowing improved, patient placed on dysphagia 1 diet with aspiration precautions.  Oral anticoagulation was resumed after discussion question with Dr. Leonie Man from neurology.  08/11/19: Seen and examined.  Alert not in distress.  Does not answer any questions.  Not cooperative with physical exam.  Per bedside RN patient is in a bad mood.  Assessment/Plan: Principal Problem:   Acute respiratory failure due to COVID-19 [facility]) Active Problems:   Chronic hepatitis C without hepatic coma ([name])   Diabetes mellitus type 2, uncomplicated ([place])   History of coronary artery disease   Hypertension   ESRD (end stage renal disease) on dialysis ([place])   Acute respiratory failure with hypoxia ([name])   Cerebral thrombosis with cerebral infarction   Cerebral embolism with cerebral infarction    Acute COVID-19 viral pneumonia Positive COVID-19 on 07/24/2019 Completed course of remdesivir and Decadron O2 saturation 93% on 1 L Home O2 evaluation for DC planning Continue to maintain O2 saturation greater 92% Add Bronchodilators  Acute CVA left pons,  left occipital lobe, bilateral frontal lobes. Possibly embolic phenomenon Continue aspirin 81 mg daily, Lipitor 40 mg daily and Coumadin with goal INR between 2 and 3. OT assessment recommended SNF CSW assisting with placement Continue PT OT with assistance and fall precautions.  ESRD on HD TTS Followed by nephrology Volume status addressed with hemodialysis  History of LV thrombus On Coumadin, continue  Insulin-dependent type 2 diabetes with hypoglycemia Last A1c 6.3 Decreased dose of Lantus from 10 units nightly to 5 unit  History of renal transplant Currently not on any immunosuppressants Previously on prednisone We will review records   DVT prophylaxis: Warfarin  Code Status: Full code  Family Communication: No family at bedside  Disposition Plan: likely skilled nursing facility when bed becomes available.     Objective: Vitals:   08/10/19 2001 08/10/19 2334 08/11/19 0322 08/11/19 0800  BP: (!) 98/50 (!) 101/56  (!) 112/49  Pulse: 88 86  92  Resp: (!) 28 13  14   Temp: 98.7 F (37.1 C) 98.2 F (36.8 C) 98.8 F (37.1 C)   TempSrc: Oral     SpO2: 90% 99%  95%  Weight:      Height:        Intake/Output Summary (Last 24 hours) at 08/11/2019 1138 Last data filed at 08/11/2019 [id number] Gross per 24 hour  Intake 450 ml  Output 0 ml  Net 450 ml   Filed Weights   08/07/19 1000 08/07/19 1352 08/09/19 0700  Weight: 75.2 kg 75.2 kg 74.2 kg    Exam:  . General: 69 y.o. year-old male well developed well nourished in no acute distress.  Alert and noncooperative. . Cardiovascular: Regular rate and rhythm with no  rubs or gallops.  No thyromegaly or JVD noted.   Marland Kitchen Respiratory: Mild rales at bases no wheezing noted.  Poor inspiratory effort. . Abdomen: Soft nontender nondistended with normal bowel sounds x4 quadrants. . Musculoskeletal: Trace lower extremity edema.  Psychiatry: Noncooperative.  Data Reviewed: CBC: Recent Labs  Lab 08/07/19 0500 08/08/19 0500  08/09/19 0314 08/10/19 0500 08/11/19 0332  WBC 9.4 8.9 8.8 9.8 8.9  HGB 9.3* 8.5* 8.1* 9.0* 8.2*  HCT 28.7* 26.0* 24.7* 27.9* 25.3*  MCV 105.1* 104.0* 104.7* 105.7* 106.3*  PLT 306 238 206 230 [id number]   Basic Metabolic Panel: Recent Labs  Lab 08/06/19 0420 08/07/19 0500 08/08/19 0500 08/10/19 0500  NA 138 138 134* 139  K 5.0 5.5* 5.3* 4.8  CL 97* 96* 94* 97*  CO2 26 24 25 25   GLUCOSE 153* 185* 153* 157*  BUN 76* 102* 55* 84*  CREATININE 9.25* 11.08* 7.56* 9.40*  CALCIUM 7.8* 8.2* 7.7* 8.1*  MG 2.4  --   --   --   PHOS  --   --   --  4.5   GFR: Estimated Creatinine Clearance: 8.1 mL/min (A) (by C-G formula based on SCr of 9.4 mg/dL (H)). Liver Function Tests: Recent Labs  Lab 08/10/19 0500  ALBUMIN 2.5*   No results for input(s): LIPASE, AMYLASE in the last 168 hours. No results for input(s): AMMONIA in the last 168 hours. Coagulation Profile: Recent Labs  Lab 08/07/19 0500 08/08/19 0500 08/09/19 0314 08/10/19 0500 08/11/19 0947  INR 2.4* 2.8* 3.1* 2.6* 2.6*   Cardiac Enzymes: No results for input(s): CKTOTAL, CKMB, CKMBINDEX, TROPONINI in the last 168 hours. BNP (last 3 results) No results for input(s): PROBNP in the last 8760 hours. HbA1C: No results for input(s): HGBA1C in the last 72 hours. CBG: Recent Labs  Lab 08/10/19 2340 08/11/19 0219 08/11/19 0319 08/11/19 0751 08/11/19 0842  GLUCAP 127* 100* 92 63* 83   Lipid Profile: No results for input(s): CHOL, HDL, LDLCALC, TRIG, CHOLHDL, LDLDIRECT in the last 72 hours. Thyroid Function Tests: No results for input(s): TSH, T4TOTAL, FREET4, T3FREE, THYROIDAB in the last 72 hours. Anemia Panel: No results for input(s): VITAMINB12, FOLATE, FERRITIN, TIBC, IRON, RETICCTPCT in the last 72 hours. Urine analysis: No results found for: COLORURINE, APPEARANCEUR, LABSPEC, [place], GLUCOSEU, HGBUR, BILIRUBINUR, KETONESUR, PROTEINUR, UROBILINOGEN, NITRITE, LEUKOCYTESUR Sepsis  Labs: @LABRCNTIP (procalcitonin:4,lacticidven:4)  ) Recent Results (from the past 240 hour(s))  MRSA PCR Screening     Status: None   Collection Time: 08/02/19  8:43 AM   Specimen: Nasopharyngeal  Result Value Ref Range Status   MRSA by PCR NEGATIVE NEGATIVE Final    Comment:        The GeneXpert MRSA Assay (FDA approved for NASAL specimens only), is one component of a comprehensive MRSA colonization surveillance program. It is not intended to diagnose MRSA infection nor to guide or monitor treatment for MRSA infections. Performed at [facility], [place] [address] [place], [place] [address]       Studies: No results found.  Scheduled Meds: .  stroke: mapping our early stages of recovery book   Does not apply Once  . allopurinol  100 mg Oral Daily  . aspirin  81 mg Oral Daily  . atorvastatin  40 mg Oral Daily  . calcium acetate  1,334 mg Oral TID WC  . Chlorhexidine Gluconate Cloth  6 each Topical Q0600  . Chlorhexidine Gluconate Cloth  6 each Topical Q0600  . darbepoetin (ARANESP) injection - DIALYSIS  100 mcg Intravenous Q Wed-HD  . heparin  8,000 Units Dialysis Once in dialysis  . insulin aspart  0-6 Units Subcutaneous Q4H  . insulin glargine  5 Units Subcutaneous QHS  . metoCLOPramide  5 mg Oral TID WC  . [START ON 08/12/2019] metoprolol succinate  25 mg Oral Daily  . sevelamer carbonate  2.4 g Oral TID WC  . sodium chloride flush  3 mL Intravenous Q12H  . warfarin  2.5 mg Oral ONCE-1800  . Warfarin - Pharmacist Dosing Inpatient   Does not apply q1800    Continuous Infusions: . sodium chloride    . sodium chloride    . sodium chloride    . sodium chloride       LOS: 12 days     Kayleen Memos, MD [facility] Pager [phone]  If 7PM-7AM, please contact night-coverage [email or web address] Password [facility] 08/11/2019, 11:38 AM

## 2019-08-11 NOTE — Progress Notes (Signed)
  [facility] Progress Note   Subjective:   Patient not examined today directly given COVID-19 + status, utilizing data taken from chart +/- discussions w/ providers and staff.    Objective Vitals:   08/10/19 2001 08/10/19 2334 08/11/19 0322 08/11/19 0800  BP: (!) 98/50 (!) 101/56  (!) 112/49  Pulse: 88 86  92  Resp: (!) 28 13  14   Temp: 98.7 F (37.1 C) 98.2 F (36.8 C) 98.8 F (37.1 C)   TempSrc: Oral     SpO2: 90% 99%  95%  Weight:      Height:        Physical Exam  Patient not examined today directly given COVID-19 + status, utilizing data taken from chart +/- discussions w/ providers and staff.     Dialysis OP: Currently GO TTS2 - previously [facility]  4h 33min  400/800   80kg  2/2 bath R AVF  Hep 8000 venofer 50/week + Mircera 75 last 12/17  parsabiv 7.5, no calcitriol      Assessment/Plan: 1. COVID-19 PNA - Remdesivir/Decadron per primary.  2. Acute embolic CVA - multiple acute infarcts/prior LV thrombus/ hx old strokes. Stroke team signed off 12/25. Appears to have sig cognitive / memory issues, vs poss aphasia.  3. ESRD - HD TTS. Next HD Tuesday.  4. HTN/volume-  BP's stable on metoprolol 50qd only. No vol excess, keeping even on HD w/ stable wt's.  5. Anemia-  Hgb 8-9s.  Aranesp 100 q Wed (next dose 12/30)  6. Metabolic Bone Disease- Corr Ca ok. On PhosLo + Renvela binder. Check Phos with next HD.  7. Nutrition - DYS 1 diet per SLP 8. Prognosis - sig cognitive issues not sure which are acute vs chronic, not sure.  Would consider GOC / PC consult w/ mult comorbidities.   Kelly Splinter, MD 08/11/2019, 10:10 AM    Medications: . sodium chloride    . sodium chloride    . sodium chloride    . sodium chloride     .  stroke: mapping our early stages of recovery book   Does not apply Once  . allopurinol  100 mg Oral Daily  . aspirin  81 mg Oral Daily  . atorvastatin  40 mg Oral Daily  . calcium acetate  1,334 mg Oral TID WC  . Chlorhexidine  Gluconate Cloth  6 each Topical Q0600  . Chlorhexidine Gluconate Cloth  6 each Topical Q0600  . darbepoetin (ARANESP) injection - DIALYSIS  100 mcg Intravenous Q Wed-HD  . heparin  8,000 Units Dialysis Once in dialysis  . insulin aspart  0-6 Units Subcutaneous Q4H  . insulin glargine  10 Units Subcutaneous QHS  . metoCLOPramide  5 mg Oral TID WC  . metoprolol succinate  50 mg Oral Daily  . sevelamer carbonate  2.4 g Oral TID WC  . sodium chloride flush  3 mL Intravenous Q12H  . Warfarin - Pharmacist Dosing Inpatient   Does not apply [phone]

## 2019-08-11 NOTE — Progress Notes (Signed)
SATURATION QUALIFICATIONS: (This note is used to comply with regulatory documentation for home oxygen)  Patient Saturations on Room Air at Rest = 86%  Patient Saturations on Room Air while Ambulating = Patient refused to get out of bed  Patient Saturations on 1  Liter of oxygen while Resting  = 93%

## 2019-08-11 NOTE — Progress Notes (Signed)
Hypoglycemic Event  CBG: 63  Treatment: orange juice x2  Symptoms: lethargy  Possible Reasons for Event: poor po intake  Comments/MD notified:Dr Hall during rounding

## 2019-08-11 NOTE — Progress Notes (Signed)
Called patient's wife [name] to give updates.  No answer.  Left a HIPPA compliant voicemail message.

## 2019-08-11 NOTE — Progress Notes (Signed)
Physical Therapy Treatment Patient Details Name: Jonathan [name]. MRN: [id number] DOB: Jan 14, 1951 Today's Date: 08/11/2019    History of Present Illness 69 y.o. 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 Diagnosed with COVID 6 days prior to coming to ED, where chest x-ray showed multifocal pneumonia. Admitted 07/30/19 for treatment of acute encephalopathy, difficulty swallowing and pneumonia.MRI of the brain showed small acute infarcts in the left pons, left occipital lob and right frontal lobe in addition to chronic infarcts     PT Comments    Pt received in recliner, stating he wanted to go back to bed. Mod assist sit to stand and mod assist SPT with RW. Pt with little engagement during session, decreased eye contact and verbalizations. Eyes closed once in bed, declining any further intervention.    Follow Up Recommendations  SNF     Equipment Recommendations  None recommended by PT    Recommendations for Other Services       Precautions / Restrictions Precautions Precautions: Fall Restrictions Weight Bearing Restrictions: No    Mobility  Bed Mobility Overal bed mobility: Needs Assistance Bed Mobility: Sit to Supine       Sit to supine: Min assist;HOB elevated   General bed mobility comments: assist with BLE into bed  Transfers Overall transfer level: Needs assistance Equipment used: Rolling walker (2 wheeled) Transfers: Sit to/from [facility] Sit to Stand: Mod assist Stand pivot transfers: Mod assist       General transfer comment: cues for hand placement, assist to power up from recliner. Pivot steps with RW recliner to bed.  Ambulation/Gait                 Stairs             Wheelchair Mobility    Modified Rankin (Stroke Patients Only)        Balance Overall balance assessment: Needs assistance Sitting-balance support: Feet supported;No upper extremity supported Sitting balance-Leahy Scale: Fair     Standing balance support: Bilateral upper extremity supported;During functional activity Standing balance-Leahy Scale: Poor Standing balance comment: reliant on external support                            Cognition Arousal/Alertness: Awake/alert Behavior During Therapy: Flat affect Overall Cognitive Status: Impaired/Different from baseline Area of Impairment: Orientation;Memory;Safety/judgement;Awareness;Following commands;Problem solving;Attention                 Orientation Level: Disoriented to;Time;Place;Situation Current Attention Level: Sustained Memory: Decreased short-term memory Following Commands: Follows one step commands with increased time Safety/Judgement: Decreased awareness of safety;Decreased awareness of deficits Awareness: Intellectual Problem Solving: Slow processing;Decreased initiation;Difficulty sequencing;Requires verbal cues;Requires tactile cues General Comments: decreased engagement in session with minimal interaction      Exercises      General Comments General comments (skin integrity, edema, etc.): Pt on 1 L O2 with SpO2 > 90%      Pertinent Vitals/Pain Pain Assessment: Faces Faces Pain Scale: No hurt    Home Living                      Prior Function            PT Goals (current goals can now be found in the care plan section) Acute Rehab PT Goals Patient  Stated Goal: Pt unable to state  Progress towards PT goals: Progressing toward goals    Frequency    Min 2X/week      PT Plan Current plan remains appropriate    Co-evaluation              AM-PAC PT "6 Clicks" Mobility   Outcome Measure  Help needed turning from your back to your side while in a flat bed without using bedrails?: A Little Help needed moving from lying on  your back to sitting on the side of a flat bed without using bedrails?: A Little Help needed moving to and from a bed to a chair (including a wheelchair)?: A Lot Help needed standing up from a chair using your arms (e.g., wheelchair or bedside chair)?: A Lot Help needed to walk in hospital room?: Total Help needed climbing 3-5 steps with a railing? : Total 6 Click Score: 12    End of Session Equipment Utilized During Treatment: Gait belt;Oxygen Activity Tolerance: Patient tolerated treatment well Patient left: in bed;with call bell/phone within reach;with bed alarm set Nurse Communication: Mobility status PT Visit Diagnosis: Unsteadiness on feet (R26.81);Other abnormalities of gait and mobility (R26.89);Muscle weakness (generalized) (M62.81);Ataxic gait (R26.0);Difficulty in walking, not elsewhere classified (R26.2)     Time: [id number] PT Time Calculation (min) (ACUTE ONLY): 11 min  Charges:  $Therapeutic Activity: 8-22 mins                     Lorrin Goodell, PT  Office # [phone] Pager [phone]    Lorriane Shire 08/11/2019, 1:13 PM

## 2019-08-11 NOTE — Progress Notes (Signed)
[place] for Warfarin Indication: h/o LV thrombus and new CVA   No Known Allergies  Patient Measurements: Height: 5\' 11"  (180.3 cm) Weight: 163 lb 9.3 oz (74.2 kg) IBW/kg (Calculated) : 75.3  Vital Signs: Temp: 98.8 F (37.1 C) (01/03 0322) BP: 112/49 (01/03 0800) Pulse Rate: 92 (01/03 0800)  Labs: Recent Labs    08/09/19 0314 08/10/19 0500 08/11/19 0332 08/11/19 0947  HGB 8.1* 9.0* 8.2*  --   HCT 24.7* 27.9* 25.3*  --   PLT 206 230 214  --   LABPROT 32.0* 27.7*  --  27.9*  INR 3.1* 2.6*  --  2.6*  CREATININE  --  9.40*  --   --     Estimated Creatinine Clearance: 8.1 mL/min (A) (by C-G formula based on SCr of 9.4 mg/dL (H)).   Assessment: 69 y.o. male admitted with acute respiratory failure, hx LV thrombus on Eliquis PTA]. Last Eliquis dose ~1000 on 12/22. Patient transitioned to heparin on admission with lower goals targeted due to new CVA (MRI confirmed) and risk of hemorrhagic conversion. Heparin infusion discontinued on 12/30 and patient transitioned to warfarin.   Pharmacy consulted to dose warfarin for LV thrombus. INR today is therapeutic at 2.6. Hgb 8.2. Plt 214. No reported bleeding.   Goal of Therapy:  INR goal 2 - 3 Monitor platelets by anticoagulation protocol: Yes   Plan:  Warfarin 2.5mg  tonight  Daily INR, s/s bleeding Follow up ability to educate family member on warfarin - patient's son listed in [facility] did not answer yesterday and today  Cristela Felt, PharmD PGY1 Pharmacy Resident [employer]: [phone]  08/11/2019 11:22 AM

## 2019-08-12 LAB — CBC WITH DIFFERENTIAL/PLATELET
Abs Immature Granulocytes: 0.1 10*3/uL — ABNORMAL HIGH (ref 0.00–0.07)
Basophils Absolute: 0 10*3/uL (ref 0.0–0.1)
Basophils Relative: 0 %
Eosinophils Absolute: 0.1 10*3/uL (ref 0.0–0.5)
Eosinophils Relative: 1 %
HCT: 25.3 % — ABNORMAL LOW (ref 39.0–52.0)
Hemoglobin: 8.2 g/dL — ABNORMAL LOW (ref 13.0–17.0)
Immature Granulocytes: 1 %
Lymphocytes Relative: 18 %
Lymphs Abs: 1.4 10*3/uL (ref 0.7–4.0)
MCH: 33.7 pg (ref 26.0–34.0)
MCHC: 32.4 g/dL (ref 30.0–36.0)
MCV: 104.1 fL — ABNORMAL HIGH (ref 80.0–100.0)
Monocytes Absolute: 0.8 10*3/uL (ref 0.1–1.0)
Monocytes Relative: 10 %
Neutro Abs: 5.8 10*3/uL (ref 1.7–7.7)
Neutrophils Relative %: 70 %
Platelets: 206 10*3/uL (ref 150–400)
RBC: 2.43 MIL/uL — ABNORMAL LOW (ref 4.22–5.81)
RDW: 15.9 % — ABNORMAL HIGH (ref 11.5–15.5)
WBC: 8.2 10*3/uL (ref 4.0–10.5)
nRBC: 0 % (ref 0.0–0.2)

## 2019-08-12 LAB — COMPREHENSIVE METABOLIC PANEL
ALT: 10 U/L (ref 0–44)
AST: <5 U/L — ABNORMAL LOW (ref 15–41)
Albumin: 2.1 g/dL — ABNORMAL LOW (ref 3.5–5.0)
Alkaline Phosphatase: 63 U/L (ref 38–126)
Anion gap: 15 (ref 5–15)
BUN: 112 mg/dL — ABNORMAL HIGH (ref 8–23)
CO2: 27 mmol/L (ref 22–32)
Calcium: 8 mg/dL — ABNORMAL LOW (ref 8.9–10.3)
Chloride: 95 mmol/L — ABNORMAL LOW (ref 98–111)
Creatinine, Ser: 12.66 mg/dL — ABNORMAL HIGH (ref 0.61–1.24)
GFR calc Af Amer: 4 mL/min — ABNORMAL LOW (ref >60)
GFR calc non Af Amer: 4 mL/min — ABNORMAL LOW (ref >60)
Glucose, Bld: 121 mg/dL — ABNORMAL HIGH (ref 70–99)
Potassium: 5.6 mmol/L — ABNORMAL HIGH (ref 3.5–5.1)
Sodium: 137 mmol/L (ref 135–145)
Total Bilirubin: 0.4 mg/dL (ref 0.3–1.2)
Total Protein: 5.6 g/dL — ABNORMAL LOW (ref 6.5–8.1)

## 2019-08-12 LAB — GLUCOSE, CAPILLARY
Glucose-Capillary: 65 mg/dL — ABNORMAL LOW (ref 70–99)
Glucose-Capillary: 77 mg/dL (ref 70–99)
Glucose-Capillary: 159 mg/dL — ABNORMAL HIGH (ref 70–99)
Glucose-Capillary: 80 mg/dL (ref 70–99)
Glucose-Capillary: 95 mg/dL (ref 70–99)
Glucose-Capillary: 172 mg/dL — ABNORMAL HIGH (ref 70–99)
Glucose-Capillary: 144 mg/dL — ABNORMAL HIGH (ref 70–99)

## 2019-08-12 LAB — FIBRINOGEN: Fibrinogen: 646 mg/dL — ABNORMAL HIGH (ref 210–475)

## 2019-08-12 LAB — C-REACTIVE PROTEIN: CRP: 1.8 mg/dL — ABNORMAL HIGH (ref <1.0)

## 2019-08-12 LAB — D-DIMER, QUANTITATIVE: D-Dimer, Quant: 0.61 ug/mL-FEU — ABNORMAL HIGH (ref 0.00–0.50)

## 2019-08-12 LAB — LACTATE DEHYDROGENASE: LDH: 352 U/L — ABNORMAL HIGH (ref 98–192)

## 2019-08-12 LAB — PROTIME-INR
INR: 3 — ABNORMAL HIGH (ref 0.8–1.2)
Prothrombin Time: 31.2 seconds — ABNORMAL HIGH (ref 11.4–15.2)

## 2019-08-12 MED ORDER — WARFARIN SODIUM 2 MG PO TABS
2.0000 mg | ORAL_TABLET | Freq: Once | ORAL | Status: AC
  Administered 2019-08-12: 2 mg via ORAL
  Filled 2019-08-12: qty 1

## 2019-08-12 MED ORDER — HEPARIN SODIUM (PORCINE) 1000 UNIT/ML DIALYSIS: 4000.0000 [IU] | INTRAMUSCULAR | Status: DC | PRN

## 2019-08-12 MED ORDER — CHLORHEXIDINE GLUCONATE CLOTH 2 % EX PADS
6.0000 | MEDICATED_PAD | Freq: Every day | CUTANEOUS | Status: DC
  Administered 2019-08-12 – 2019-08-14 (×3): 6 via TOPICAL

## 2019-08-12 MED ORDER — PREDNISONE 5 MG PO TABS
5.0000 mg | ORAL_TABLET | Freq: Every day | ORAL | Status: DC
  Administered 2019-08-12 – 2019-09-22 (×37): 5 mg via ORAL
  Filled 2019-08-12 (×39): qty 1

## 2019-08-12 NOTE — Progress Notes (Signed)
PROGRESS NOTE  Jonathan Lizano Sr. [id number] DOB: 14-Jan-1951 DOA: 07/30/2019 PCP: Marton Redwood, MD  HPI/Recap of past 67 hours: 69 year old male with history of ESRD on hemodialysis, Tuesday Thursday and Saturday history of renal transplant, history of stroke, hypertension, type 2 diabetes mellitus, left ventricular thrombus came to ED with difficulty swallowing.  His wife noticed him having difficulty swallowing for about 2 weeks.  He was tested positive for COVID-19 on December 16.  Chest x-ray showed right upper and right lower lobe, left lower lobe interstitial infiltrate. Patient was noted with diagnosis of acute hypoxic respiratory failure due to SARS COVID-19 viral pneumonia. Further work-up with brain MRI showed small acute infarct in the left pons, left occipital lobe and bilateral frontal lobes.  Initially failed swallow evaluation but fortunately swallowing improved, patient placed on dysphagia 1 diet with aspiration precautions.  Oral anticoagulation was resumed after discussion question with Dr. Leonie Man from neurology.  08/12/19: Seen and examined.  More interactive today.  He has no new complaints.  Getting hemodialysis.    Assessment/Plan: Principal Problem:   Acute respiratory failure due to COVID-19 [facility]) Active Problems:   Chronic hepatitis C without hepatic coma ([name])   Diabetes mellitus type 2, uncomplicated ([place])   History of coronary artery disease   Hypertension   ESRD (end stage renal disease) on dialysis ([place])   Acute respiratory failure with hypoxia ([name])   Cerebral thrombosis with cerebral infarction   Cerebral embolism with cerebral infarction    Acute COVID-19 viral pneumonia Positive COVID-19 on 07/24/2019 Completed course of remdesivir and Decadron Home O2 evaluation for DC planning Continue to maintain O2 saturation greater 92% Continue bronchodilators O2 saturation 96% on 2 L.  Acute CVA left pons, left occipital lobe, bilateral frontal  lobes. Possibly embolic phenomenon Continue aspirin 81 mg daily, Lipitor 40 mg daily and Coumadin with goal INR between 2 and 3. OT assessment recommended SNF CSW assisting with placement Continue PT OT with assistance and fall precautions.  ESRD on HD TTS Followed by nephrology Volume status addressed with hemodialysis  History of LV thrombus On Coumadin, continue INR 3.0 Coumadin managed by pharmacy  Insulin-dependent type 2 diabetes with hypoglycemia Last A1c 6.3 Discontinue Lantus Continue sensitive insulin sliding scale Avoid hypoglycemia  History of renal transplant Restarted prednisone   DVT prophylaxis: Warfarin  Code Status: Full code  Family Communication: No family at bedside  Disposition Plan: likely skilled nursing facility when bed becomes available.     Objective: Vitals:   08/12/19 1026 08/12/19 1034 08/12/19 1100 08/12/19 1200  BP:  (!) 137/56 134/67 133/62  Pulse: 82 83 83 87  Resp: 16 17 15 18   Temp:  97.9 F (36.6 C)  97.9 F (36.6 C)  TempSrc:  Oral  Oral  SpO2:  94% 92% 94%  Weight:      Height:        Intake/Output Summary (Last 24 hours) at 08/12/2019 1502 Last data filed at 08/12/2019 1242 Gross per 24 hour  Intake 300 ml  Output 400 ml  Net -100 ml   Filed Weights   08/07/19 1352 08/09/19 0700 08/12/19 0745  Weight: 75.2 kg 74.2 kg 72.3 kg    Exam:  . General: 69 y.o. year-old male well-developed well-nourished no acute distress.  Alert and more interactive today.   . Cardiovascular: Regular rate and rhythm no rubs or gallops. Marland Kitchen Respiratory: Mild rales at bases.  No wheezing noted.. . Abdomen: Soft nontender normal bowel sounds present.   Musculoskeletal:  Trace lower extremity edema bilaterally.   Psychiatry: Mood is appropriate for condition and setting.   Data Reviewed: CBC: Recent Labs  Lab 08/08/19 0500 08/09/19 0314 08/10/19 0500 08/11/19 0332 08/12/19 0630  WBC 8.9 8.8 9.8 8.9 8.2  NEUTROABS  --   --    --   --  5.8  HGB 8.5* 8.1* 9.0* 8.2* 8.2*  HCT 26.0* 24.7* 27.9* 25.3* 25.3*  MCV 104.0* 104.7* 105.7* 106.3* 104.1*  PLT 238 206 230 214 [id number]   Basic Metabolic Panel: Recent Labs  Lab 08/06/19 0420 08/07/19 0500 08/08/19 0500 08/10/19 0500 08/12/19 0630  NA 138 138 134* 139 137  K 5.0 5.5* 5.3* 4.8 5.6*  CL 97* 96* 94* 97* 95*  CO2 26 24 25 25 27   GLUCOSE 153* 185* 153* 157* 121*  BUN 76* 102* 55* 84* 112*  CREATININE 9.25* 11.08* 7.56* 9.40* 12.66*  CALCIUM 7.8* 8.2* 7.7* 8.1* 8.0*  MG 2.4  --   --   --   --   PHOS  --   --   --  4.5  --    GFR: Estimated Creatinine Clearance: 5.9 mL/min (A) (by C-G formula based on SCr of 12.66 mg/dL (H)). Liver Function Tests: Recent Labs  Lab 08/10/19 0500 08/12/19 0630  AST  --  <5*  ALT  --  10  ALKPHOS  --  63  BILITOT  --  0.4  PROT  --  5.6*  ALBUMIN 2.5* 2.1*   No results for input(s): LIPASE, AMYLASE in the last 168 hours. No results for input(s): AMMONIA in the last 168 hours. Coagulation Profile: Recent Labs  Lab 08/08/19 0500 08/09/19 0314 08/10/19 0500 08/11/19 0947 08/12/19 0800  INR 2.8* 3.1* 2.6* 2.6* 3.0*   Cardiac Enzymes: No results for input(s): CKTOTAL, CKMB, CKMBINDEX, TROPONINI in the last 168 hours. BNP (last 3 results) No results for input(s): PROBNP in the last 8760 hours. HbA1C: No results for input(s): HGBA1C in the last 72 hours. CBG: Recent Labs  Lab 08/11/19 2353 08/12/19 0415 08/12/19 0517 08/12/19 0803 08/12/19 1201  GLUCAP 116* 65* 77 159* 80   Lipid Profile: No results for input(s): CHOL, HDL, LDLCALC, TRIG, CHOLHDL, LDLDIRECT in the last 72 hours. Thyroid Function Tests: No results for input(s): TSH, T4TOTAL, FREET4, T3FREE, THYROIDAB in the last 72 hours. Anemia Panel: No results for input(s): VITAMINB12, FOLATE, FERRITIN, TIBC, IRON, RETICCTPCT in the last 72 hours. Urine analysis: No results found for: COLORURINE, APPEARANCEUR, LABSPEC, PHURINE, GLUCOSEU, HGBUR,  BILIRUBINUR, KETONESUR, PROTEINUR, UROBILINOGEN, NITRITE, LEUKOCYTESUR Sepsis Labs: @LABRCNTIP (procalcitonin:4,lacticidven:4)  ) No results found for this or any previous visit (from the past 240 hour(s)).    Studies: No results found.  Scheduled Meds: .  stroke: mapping our early stages of recovery book   Does not apply Once  . allopurinol  100 mg Oral Daily  . aspirin  81 mg Oral Daily  . atorvastatin  40 mg Oral Daily  . calcium acetate  1,334 mg Oral TID WC  . Chlorhexidine Gluconate Cloth  6 each Topical Q0600  . Chlorhexidine Gluconate Cloth  6 each Topical Q0600  . Chlorhexidine Gluconate Cloth  6 each Topical Q0600  . darbepoetin (ARANESP) injection - DIALYSIS  100 mcg Intravenous Q Wed-HD  . heparin  8,000 Units Dialysis Once in dialysis  . insulin aspart  0-6 Units Subcutaneous Q4H  . ipratropium  2 puff Inhalation Q8H  . levalbuterol  2 puff Inhalation Q8H  . metoCLOPramide  5 mg Oral  TID WC  . metoprolol succinate  25 mg Oral Daily  . predniSONE  5 mg Oral Q breakfast  . sevelamer carbonate  2.4 g Oral TID WC  . sodium chloride flush  3 mL Intravenous Q12H  . warfarin  2 mg Oral ONCE-1800  . Warfarin - Pharmacist Dosing Inpatient   Does not apply q1800    Continuous Infusions: . sodium chloride    . sodium chloride    . sodium chloride    . sodium chloride       LOS: 13 days     Kayleen Memos, MD [facility] Pager [phone]  If 7PM-7AM, please contact night-coverage [email or web address] Password [email or web address] 08/12/2019, 3:02 PM

## 2019-08-12 NOTE — Progress Notes (Signed)
Updated the patient's spouse Ms. [name] via phone.  All questions answered.

## 2019-08-12 NOTE — Progress Notes (Signed)
[place] for Warfarin Indication: h/o LV thrombus and new CVA   No Known Allergies  Patient Measurements: Height: 5\' 11"  (180.3 cm) Weight: 159 lb 6.3 oz (72.3 kg) IBW/kg (Calculated) : 75.3  Vital Signs: Temp: 97.7 F (36.5 C) (01/04 0745) Temp Source: Oral (01/04 0417) BP: 107/56 (01/04 0900) Pulse Rate: 80 (01/04 0800)  Labs: Recent Labs    08/10/19 0500 08/11/19 0332 08/11/19 0947 08/12/19 0630 08/12/19 0800  HGB 9.0* 8.2*  --  8.2*  --   HCT 27.9* 25.3*  --  25.3*  --   PLT 230 214  --  206  --   LABPROT 27.7*  --  27.9*  --  31.2*  INR 2.6*  --  2.6*  --  3.0*  CREATININE 9.40*  --   --  12.66*  --     Estimated Creatinine Clearance: 5.9 mL/min (A) (by C-G formula based on SCr of 12.66 mg/dL (H)).   Assessment: 69 y.o. male admitted with acute respiratory failure, hx LV thrombus on Eliquis PTA]. Last Eliquis dose ~1000 on 12/22. Patient transitioned to heparin on admission with lower goals targeted due to new CVA (MRI confirmed) and risk of hemorrhagic conversion. Heparin infusion discontinued on 12/30 and patient transitioned to warfarin.   Pharmacy consulted to dose warfarin for LV thrombus. INR today is therapeutic at 3. Hgb 8.2.  No reported bleeding.   Goal of Therapy:  INR goal 2 - 3 Monitor platelets by anticoagulation protocol: Yes   Plan:  Warfarin 2 mg tonight  Daily INR, s/s bleeding Follow up ability to educate family member on warfarin   Rufina Kimery A. Levada Dy, PharmD, BCPS, FNKF Clinical Pharmacist [place] Please utilize Amion for appropriate phone number to reach the unit pharmacist ([place])    08/12/2019 10:00 AM

## 2019-08-12 NOTE — TOC Progression Note (Signed)
Transition of Care (TOC) - Progression Note    Patient Details  Name: Jonathan [name]. MRN: [id number] Date of Birth: [birth year]  Transition of Care [facility]) CM/SW [place], LCSW Phone Number: 08/12/2019, 2:19 PM  Clinical Narrative:    No SNF bed offers at this time due to Dialysis transportation needs. CSW to continue to follow.   Expected Discharge Plan: [place]    Expected Discharge Plan and Services Expected Discharge Plan: [place] [employer] Housing arrangements for the past 2 months: Single Family Home                                       Social Determinants of Health (SDOH) Interventions    Readmission Risk Interventions No flowsheet data found.

## 2019-08-12 NOTE — Progress Notes (Signed)
[facility] NEPHROLOGY PROGRESS NOTE  Assessment/ Plan: Pt is a 69 y.o. yo male ESRD on HD TTS, HTN, stroke, DM, who with COVID-19 pneumonia.  Dialysis OP: Currently GO TTS2 - previously [facility]  4h 63min  400/800   80kg  2/2 bath R AVF  Hep 8000 venofer 50/week + Mircera 75 last 12/17, parsabiv 7.5, no calcitriol   #Acute COVID-19 pneumonia: Completed course of remdesivir and Decadron.  As per primary team.  # ESRD: Received dialysis earlier today which was postponed from yesterday.  Plan for next short HD treatment tomorrow.  # Anemia due to CKD: Continue Aranesp.  # Secondary hyperparathyroidism: Continue binders.  Monitor phosphorus level.  # HTN/volume: Blood pressure acceptable.  #Acute stroke: Probably embolic phenomena.  On aspirin Lipitor and Coumadin.  OT PT evaluation.  Subjective: Chart and lab results reviewed.  Patient was directly not examined because of COVID-19 status in order to minimize exposure and to preserve PPE.  Discussed with primary caretakers and reviewed the note. Objective Vital signs in last 24 hours: Vitals:   08/12/19 0900 08/12/19 0930 08/12/19 1015 08/12/19 1034  BP: (!) 107/56   (!) 137/56  Pulse:   82   Resp:      Temp:      TempSrc:      SpO2: 92% 94% 94% 94%  Weight:      Height:       Weight change:   Intake/Output Summary (Last 24 hours) at 08/12/2019 1134 Last data filed at 08/11/2019 1830 Gross per 24 hour  Intake 240 ml  Output 400 ml  Net -160 ml       Labs: Basic Metabolic Panel: Recent Labs  Lab 08/08/19 0500 08/10/19 0500 08/12/19 0630  NA 134* 139 137  K 5.3* 4.8 5.6*  CL 94* 97* 95*  CO2 25 25 27   GLUCOSE 153* 157* 121*  BUN 55* 84* 112*  CREATININE 7.56* 9.40* 12.66*  CALCIUM 7.7* 8.1* 8.0*  PHOS  --  4.5  --    Liver Function Tests: Recent Labs  Lab 08/10/19 0500 08/12/19 0630  AST  --  <5*  ALT  --  10  ALKPHOS  --  63  BILITOT  --  0.4  PROT  --  5.6*  ALBUMIN 2.5* 2.1*   No  results for input(s): LIPASE, AMYLASE in the last 168 hours. No results for input(s): AMMONIA in the last 168 hours. CBC: Recent Labs  Lab 08/08/19 0500 08/09/19 0314 08/10/19 0500 08/11/19 0332 08/12/19 0630  WBC 8.9 8.8 9.8 8.9 8.2  NEUTROABS  --   --   --   --  5.8  HGB 8.5* 8.1* 9.0* 8.2* 8.2*  HCT 26.0* 24.7* 27.9* 25.3* 25.3*  MCV 104.0* 104.7* 105.7* 106.3* 104.1*  PLT 238 206 230 214 206   Cardiac Enzymes: No results for input(s): CKTOTAL, CKMB, CKMBINDEX, TROPONINI in the last 168 hours. CBG: Recent Labs  Lab 08/11/19 2005 08/11/19 2353 08/12/19 0415 08/12/19 0517 08/12/19 0803  GLUCAP 156* 116* 65* 77 159*    Iron Studies: No results for input(s): IRON, TIBC, TRANSFERRIN, FERRITIN in the last 72 hours. Studies/Results: No results found.  Medications: Infusions: . sodium chloride    . sodium chloride    . sodium chloride    . sodium chloride      Scheduled Medications: .  stroke: mapping our early stages of recovery book   Does not apply Once  . allopurinol  100 mg Oral Daily  .  aspirin  81 mg Oral Daily  . atorvastatin  40 mg Oral Daily  . calcium acetate  1,334 mg Oral TID WC  . Chlorhexidine Gluconate Cloth  6 each Topical Q0600  . Chlorhexidine Gluconate Cloth  6 each Topical Q0600  . darbepoetin (ARANESP) injection - DIALYSIS  100 mcg Intravenous Q Wed-HD  . heparin  8,000 Units Dialysis Once in dialysis  . insulin aspart  0-6 Units Subcutaneous Q4H  . ipratropium  2 puff Inhalation Q8H  . levalbuterol  2 puff Inhalation Q8H  . metoCLOPramide  5 mg Oral TID WC  . metoprolol succinate  25 mg Oral Daily  . predniSONE  5 mg Oral Q breakfast  . sevelamer carbonate  2.4 g Oral TID WC  . sodium chloride flush  3 mL Intravenous Q12H  . warfarin  2 mg Oral ONCE-1800  . Warfarin - Pharmacist Dosing Inpatient   Does not apply q1800    have reviewed scheduled and prn medications.   Jonathan [name] 08/12/2019,11:34 AM  LOS: 13 days  Pager:  [phone]

## 2019-08-12 NOTE — Progress Notes (Signed)
error 

## 2019-08-13 LAB — GLUCOSE, CAPILLARY
Glucose-Capillary: 102 mg/dL — ABNORMAL HIGH (ref 70–99)
Glucose-Capillary: 57 mg/dL — ABNORMAL LOW (ref 70–99)
Glucose-Capillary: 52 mg/dL — ABNORMAL LOW (ref 70–99)
Glucose-Capillary: 55 mg/dL — ABNORMAL LOW (ref 70–99)
Glucose-Capillary: 69 mg/dL — ABNORMAL LOW (ref 70–99)
Glucose-Capillary: 72 mg/dL (ref 70–99)
Glucose-Capillary: 96 mg/dL (ref 70–99)
Glucose-Capillary: 197 mg/dL — ABNORMAL HIGH (ref 70–99)
Glucose-Capillary: 190 mg/dL — ABNORMAL HIGH (ref 70–99)

## 2019-08-13 LAB — BASIC METABOLIC PANEL
Anion gap: 11 (ref 5–15)
BUN: 66 mg/dL — ABNORMAL HIGH (ref 8–23)
CO2: 27 mmol/L (ref 22–32)
Calcium: 7.7 mg/dL — ABNORMAL LOW (ref 8.9–10.3)
Chloride: 98 mmol/L (ref 98–111)
Creatinine, Ser: 9.17 mg/dL — ABNORMAL HIGH (ref 0.61–1.24)
GFR calc Af Amer: 6 mL/min — ABNORMAL LOW (ref >60)
GFR calc non Af Amer: 5 mL/min — ABNORMAL LOW (ref >60)
Glucose, Bld: 205 mg/dL — ABNORMAL HIGH (ref 70–99)
Potassium: 5.6 mmol/L — ABNORMAL HIGH (ref 3.5–5.1)
Sodium: 136 mmol/L (ref 135–145)

## 2019-08-13 LAB — COMPREHENSIVE METABOLIC PANEL
ALT: 10 U/L (ref 0–44)
AST: 12 U/L — ABNORMAL LOW (ref 15–41)
Albumin: 2.2 g/dL — ABNORMAL LOW (ref 3.5–5.0)
Alkaline Phosphatase: 55 U/L (ref 38–126)
Anion gap: 12 (ref 5–15)
BUN: 66 mg/dL — ABNORMAL HIGH (ref 8–23)
CO2: 26 mmol/L (ref 22–32)
Calcium: 7.7 mg/dL — ABNORMAL LOW (ref 8.9–10.3)
Chloride: 98 mmol/L (ref 98–111)
Creatinine, Ser: 9.21 mg/dL — ABNORMAL HIGH (ref 0.61–1.24)
GFR calc Af Amer: 6 mL/min — ABNORMAL LOW (ref >60)
GFR calc non Af Amer: 5 mL/min — ABNORMAL LOW (ref >60)
Glucose, Bld: 206 mg/dL — ABNORMAL HIGH (ref 70–99)
Potassium: 5.7 mmol/L — ABNORMAL HIGH (ref 3.5–5.1)
Sodium: 136 mmol/L (ref 135–145)
Total Bilirubin: 0.5 mg/dL (ref 0.3–1.2)
Total Protein: 5.3 g/dL — ABNORMAL LOW (ref 6.5–8.1)

## 2019-08-13 LAB — PROTIME-INR
INR: 2.9 — ABNORMAL HIGH (ref 0.8–1.2)
Prothrombin Time: 30.5 seconds — ABNORMAL HIGH (ref 11.4–15.2)

## 2019-08-13 MED ORDER — DEXTROSE 50 % IV SOLN
INTRAVENOUS | Status: AC
  Filled 2019-08-13: qty 50

## 2019-08-13 MED ORDER — WARFARIN SODIUM 1 MG PO TABS
1.0000 mg | ORAL_TABLET | Freq: Once | ORAL | Status: AC
  Administered 2019-08-13: 18:00:00 1 mg via ORAL
  Filled 2019-08-13: qty 1

## 2019-08-13 MED ORDER — GLUCAGON HCL RDNA (DIAGNOSTIC) 1 MG IJ SOLR: 1.0000 mg | Freq: Once | INTRAMUSCULAR | Status: DC | PRN

## 2019-08-13 MED ORDER — DARBEPOETIN ALFA 100 MCG/0.5ML IJ SOSY
100.0000 ug | PREFILLED_SYRINGE | INTRAMUSCULAR | Status: DC
  Filled 2019-08-13 (×2): qty 0.5

## 2019-08-13 NOTE — Progress Notes (Signed)
[place] for Warfarin Indication: h/o LV thrombus and new CVA   No Known Allergies  Patient Measurements: Height: 5\' 11"  (180.3 cm) Weight: 165 lb 5.5 oz (75 kg) IBW/kg (Calculated) : 75.3  Vital Signs: Temp: 98.2 F (36.8 C) (01/05 1200) Temp Source: Oral (01/05 1200) BP: 146/79 (01/05 1200) Pulse Rate: 89 (01/05 1200)  Labs: Recent Labs    08/11/19 0332 08/11/19 0947 08/12/19 0630 08/12/19 0800  HGB 8.2*  --  8.2*  --   HCT 25.3*  --  25.3*  --   PLT 214  --  206  --   LABPROT  --  27.9*  --  31.2*  INR  --  2.6*  --  3.0*  CREATININE  --   --  12.66*  --     Estimated Creatinine Clearance: 6.1 mL/min (A) (by C-G formula based on SCr of 12.66 mg/dL (H)).   Assessment: 69 y.o. male admitted with acute respiratory failure, hx LV thrombus on Eliquis PTA]. Last Eliquis dose ~1000 on 12/22. Patient transitioned to heparin on admission with lower goals targeted due to new CVA (MRI confirmed) and risk of hemorrhagic conversion. Heparin infusion discontinued on 12/30 and patient transitioned to warfarin.   Pharmacy consulted to dose warfarin for LV thrombus.   INR not drawn today as patient refusing labs. Last INR was therapeutic at 3, however was trending up. No reported bleeding. Will give smaller dose today to be safe and will f/u labs tomorrow (perhaps patient can get with HD).   Goal of Therapy:  INR goal 2 - 3 Monitor platelets by anticoagulation protocol: Yes   Plan:  Warfarin 1 mg tonight  Daily INR, s/s bleeding Follow up ability to educate family member on warfarin   Kieryn Burtis A. Levada Dy, PharmD, BCPS, FNKF Clinical Pharmacist [place] Please utilize Amion for appropriate phone number to reach the unit pharmacist ([place])    08/13/2019 1:52 PM

## 2019-08-13 NOTE — Progress Notes (Signed)
PROGRESS NOTE  Jonathan Sweeten Sr. [id number] DOB: 06-11-1951 DOA: 07/30/2019 PCP: Marton Redwood, MD  HPI/Recap of past 64 hours: 69 year old male with history of ESRD on hemodialysis, Tuesday Thursday and Saturday history of renal transplant, history of stroke, hypertension, type 2 diabetes mellitus, left ventricular thrombus came to ED with difficulty swallowing.  His wife noticed him having difficulty swallowing for about 2 weeks.  He was tested positive for COVID-19 on December 16.  Chest x-ray showed right upper and right lower lobe, left lower lobe interstitial infiltrate. Patient was noted with diagnosis of acute hypoxic respiratory failure due to SARS COVID-19 viral pneumonia. Further work-up with brain MRI showed small acute infarct in the left pons, left occipital lobe and bilateral frontal lobes.  Initially failed swallow evaluation but fortunately swallowing improved, patient placed on dysphagia 1 diet with aspiration precautions.  Oral anticoagulation was resumed after discussion question with Dr. Leonie Man from neurology.  08/13/19: Seen and examined.  Minimally verbal this morning.  O2 saturation 97% on 2 L.  Hypoglycemic with CBGs in the 87s.  Hypoglycemic protocol in place.  IV team consulted to place peripheral IV access.    Assessment/Plan: Principal Problem:   Acute respiratory failure due to COVID-19 [facility]) Active Problems:   Chronic hepatitis C without hepatic coma ([name])   Diabetes mellitus type 2, uncomplicated ([place])   History of coronary artery disease   Hypertension   ESRD (end stage renal disease) on dialysis ([place])   Acute respiratory failure with hypoxia ([name])   Cerebral thrombosis with cerebral infarction   Cerebral embolism with cerebral infarction    Acute COVID-19 viral pneumonia Positive COVID-19 on 07/24/2019 Completed course of remdesivir and Decadron Home O2 evaluation for DC planning Continue to maintain O2 saturation greater 92% Continue  bronchodilators O2 saturation 96% on 2 L.  Acute CVA left pons, left occipital lobe, bilateral frontal lobes. Possibly embolic phenomenon Continue aspirin 81 mg daily, Lipitor 40 mg daily and Coumadin with goal INR between 2 and 3. OT assessment recommended SNF CSW assisting with placement Continue PT OT with assistance and fall precautions.  ESRD on HD TTS Followed by nephrology Volume status addressed with hemodialysis Hemodialysis yesterday Planned shoRT HD today.  History of LV thrombus On Coumadin, continue INR 3.0 Coumadin managed by pharmacy  Insulin-dependent type 2 diabetes with hypoglycemia Last A1c 6.3 Discontinue Lantus Discontinued insulin sliding scale today due to recurrent hypoglycemia Treated  History of renal transplant Continue prednisone On chronic steroids Add GI prophylaxis, p.o. Protonix 40 mg daily   DVT prophylaxis: Warfarin  Code Status: Full code  Family Communication:  Updated his wife via phone on 08/12/2019.  Disposition Plan: likely skilled nursing facility when bed becomes available.     Objective: Vitals:   08/13/19 0001 08/13/19 0315 08/13/19 0600 08/13/19 0754  BP: (!) 120/54 125/79  134/62  Pulse: 86 86  80  Resp: 20 18  14   Temp:  98 F (36.7 C)  98.6 F (37 C)  TempSrc:  Oral  Oral  SpO2: 99% 96%  97%  Weight:   75 kg   Height:        Intake/Output Summary (Last 24 hours) at 08/13/2019 0947 Last data filed at 08/12/2019 1845 Gross per 24 hour  Intake 380 ml  Output 0 ml  Net 380 ml   Filed Weights   08/09/19 0700 08/12/19 0745 08/13/19 0600  Weight: 74.2 kg 72.3 kg 75 kg    Exam:  . General: 69 y.o. year-old  male well-developed well-nourished no acute distress.  Alert and minimally interactive today.   . Cardiovascular: Regular rate and rhythm no rubs or gallops. Marland Kitchen Respiratory: Clear to auscultation no wheezes or rales.  Poor inspiratory effort.  .  Abdomen: Soft nontender nondistended with bowel sounds  present. Musculoskeletal: Trace lower extremity edema bilaterally. Psychiatry: Minimally interactive.  Data Reviewed: CBC: Recent Labs  Lab 08/08/19 0500 08/09/19 0314 08/10/19 0500 08/11/19 0332 08/12/19 0630  WBC 8.9 8.8 9.8 8.9 8.2  NEUTROABS  --   --   --   --  5.8  HGB 8.5* 8.1* 9.0* 8.2* 8.2*  HCT 26.0* 24.7* 27.9* 25.3* 25.3*  MCV 104.0* 104.7* 105.7* 106.3* 104.1*  PLT 238 206 230 214 [id number]   Basic Metabolic Panel: Recent Labs  Lab 08/07/19 0500 08/08/19 0500 08/10/19 0500 08/12/19 0630  NA 138 134* 139 137  K 5.5* 5.3* 4.8 5.6*  CL 96* 94* 97* 95*  CO2 24 25 25 27   GLUCOSE 185* 153* 157* 121*  BUN 102* 55* 84* 112*  CREATININE 11.08* 7.56* 9.40* 12.66*  CALCIUM 8.2* 7.7* 8.1* 8.0*  PHOS  --   --  4.5  --    GFR: Estimated Creatinine Clearance: 6.1 mL/min (A) (by C-G formula based on SCr of 12.66 mg/dL (H)). Liver Function Tests: Recent Labs  Lab 08/10/19 0500 08/12/19 0630  AST  --  <5*  ALT  --  10  ALKPHOS  --  63  BILITOT  --  0.4  PROT  --  5.6*  ALBUMIN 2.5* 2.1*   No results for input(s): LIPASE, AMYLASE in the last 168 hours. No results for input(s): AMMONIA in the last 168 hours. Coagulation Profile: Recent Labs  Lab 08/08/19 0500 08/09/19 0314 08/10/19 0500 08/11/19 0947 08/12/19 0800  INR 2.8* 3.1* 2.6* 2.6* 3.0*   Cardiac Enzymes: No results for input(s): CKTOTAL, CKMB, CKMBINDEX, TROPONINI in the last 168 hours. BNP (last 3 results) No results for input(s): PROBNP in the last 8760 hours. HbA1C: No results for input(s): HGBA1C in the last 72 hours. CBG: Recent Labs  Lab 08/12/19 2324 08/13/19 0314 08/13/19 0758 08/13/19 0855 08/13/19 0932  GLUCAP 144* 102* 57* 52* 55*   Lipid Profile: No results for input(s): CHOL, HDL, LDLCALC, TRIG, CHOLHDL, LDLDIRECT in the last 72 hours. Thyroid Function Tests: No results for input(s): TSH, T4TOTAL, FREET4, T3FREE, THYROIDAB in the last 72 hours. Anemia Panel: No results for  input(s): VITAMINB12, FOLATE, FERRITIN, TIBC, IRON, RETICCTPCT in the last 72 hours. Urine analysis: No results found for: COLORURINE, APPEARANCEUR, LABSPEC, PHURINE, GLUCOSEU, HGBUR, BILIRUBINUR, KETONESUR, PROTEINUR, UROBILINOGEN, NITRITE, LEUKOCYTESUR Sepsis Labs: @LABRCNTIP (procalcitonin:4,lacticidven:4)  ) No results found for this or any previous visit (from the past 240 hour(s)).    Studies: No results found.  Scheduled Meds: .  stroke: mapping our early stages of recovery book   Does not apply Once  . allopurinol  100 mg Oral Daily  . aspirin  81 mg Oral Daily  . atorvastatin  40 mg Oral Daily  . calcium acetate  1,334 mg Oral TID WC  . Chlorhexidine Gluconate Cloth  6 each Topical Q0600  . Chlorhexidine Gluconate Cloth  6 each Topical Q0600  . Chlorhexidine Gluconate Cloth  6 each Topical Q0600  . [START ON 08/15/2019] darbepoetin (ARANESP) injection - DIALYSIS  100 mcg Intravenous Q Thu-HD  . dextrose      . heparin  8,000 Units Dialysis Once in dialysis  . ipratropium  2 puff Inhalation Q8H  .  levalbuterol  2 puff Inhalation Q8H  . metoCLOPramide  5 mg Oral TID WC  . metoprolol succinate  25 mg Oral Daily  . predniSONE  5 mg Oral Q breakfast  . sevelamer carbonate  2.4 g Oral TID WC  . sodium chloride flush  3 mL Intravenous Q12H  . Warfarin - Pharmacist Dosing Inpatient   Does not apply q1800    Continuous Infusions: . sodium chloride    . sodium chloride    . sodium chloride    . sodium chloride       LOS: 14 days     Kayleen Memos, MD [facility] Pager [phone]  If 7PM-7AM, please contact night-coverage [email or web address] Password [facility] 08/13/2019, 9:47 AM

## 2019-08-13 NOTE — TOC Progression Note (Signed)
Transition of Care (TOC) - Progression Note    Patient Details  Name: Jonathan Tevebaugh Sr. MRN: [id number] Date of Birth: 1950/08/31  Transition of Care [facility]) CM/SW [place], LCSW Phone Number: 08/13/2019, 3:19 PM  Clinical Narrative:    CSW updated patient's spouse that we still do not have a SNF bed available. CSW investigating timeframe that patient would be out of isolation and possibly at a non-COVID SNF ([facility] is spouse's preference).    Expected Discharge Plan: [place]    Expected Discharge Plan and Services Expected Discharge Plan: [place] arrangements for the past 2 months: Single Family Home                                       Social Determinants of Health (SDOH) Interventions    Readmission Risk Interventions No flowsheet data found.

## 2019-08-13 NOTE — Progress Notes (Signed)
Tele reported 19 beat run of slow vtach and 11 beat run of vtach. MD paged. Pt asleep in bed and states he is 'alright'. Will continue to monitor.

## 2019-08-13 NOTE — Progress Notes (Signed)
OT Cancellation Note  Patient Details Name: Jonathan Angleton Sr. MRN: [id number] DOB: 09/22/1950   Cancelled Treatment:    Reason Eval/Treat Not Completed: Patient at procedure or test/ unavailable (in with IV team), will follow up for OT treatment as able.   Lou Cal, OT [facility] Pager [phone] Office [phone]    Raymondo Band 08/13/2019, 11:44 AM

## 2019-08-13 NOTE — Progress Notes (Signed)
[facility] NEPHROLOGY PROGRESS NOTE  Assessment/ Plan: Pt is a 69 y.o. yo male ESRD on HD TTS, HTN, stroke, DM, who with COVID-19 pneumonia.  Dialysis OP: Currently GO TTS2 - previously [facility]  4h 32min  400/800   80kg  2/2 bath R AVF  Hep 8000 venofer 50/week + Mircera 75 last 12/17, parsabiv 7.5, no calcitriol   #Acute COVID-19 pneumonia: Completed course of remdesivir and Decadron.  As per primary team.  # ESRD: Status post HD yesterday early morning with no UF.  Plan for regular short HD treatment today.   # Anemia due to CKD: Continue Aranesp.  # Secondary hyperparathyroidism: Continue binders.  Monitor phosphorus level.  # HTN/volume: Blood pressure acceptable.  #Acute stroke: Probably embolic phenomena.  On aspirin Lipitor and Coumadin.  OT PT evaluation.  Awaiting SNF placement.  Subjective: Chart and lab results reviewed.  Patient was directly not examined because of COVID-19 status in order to minimize exposure and to preserve PPE.  Discussed with primary caretakers and reviewed the note. Objective Vital signs in last 24 hours: Vitals:   08/13/19 0001 08/13/19 0315 08/13/19 0600 08/13/19 0754  BP: (!) 120/54 125/79  134/62  Pulse: 86 86  80  Resp: 20 18  14   Temp:  98 F (36.7 C)  98.6 F (37 C)  TempSrc:  Oral  Oral  SpO2: 99% 96%  97%  Weight:   75 kg   Height:       Weight change:   Intake/Output Summary (Last 24 hours) at 08/13/2019 0918 Last data filed at 08/12/2019 1845 Gross per 24 hour  Intake 380 ml  Output 0 ml  Net 380 ml       Labs: Basic Metabolic Panel: Recent Labs  Lab 08/08/19 0500 08/10/19 0500 08/12/19 0630  NA 134* 139 137  K 5.3* 4.8 5.6*  CL 94* 97* 95*  CO2 25 25 27   GLUCOSE 153* 157* 121*  BUN 55* 84* 112*  CREATININE 7.56* 9.40* 12.66*  CALCIUM 7.7* 8.1* 8.0*  PHOS  --  4.5  --    Liver Function Tests: Recent Labs  Lab 08/10/19 0500 08/12/19 0630  AST  --  <5*  ALT  --  10  ALKPHOS  --  63  BILITOT   --  0.4  PROT  --  5.6*  ALBUMIN 2.5* 2.1*   No results for input(s): LIPASE, AMYLASE in the last 168 hours. No results for input(s): AMMONIA in the last 168 hours. CBC: Recent Labs  Lab 08/08/19 0500 08/09/19 0314 08/10/19 0500 08/11/19 0332 08/12/19 0630  WBC 8.9 8.8 9.8 8.9 8.2  NEUTROABS  --   --   --   --  5.8  HGB 8.5* 8.1* 9.0* 8.2* 8.2*  HCT 26.0* 24.7* 27.9* 25.3* 25.3*  MCV 104.0* 104.7* 105.7* 106.3* 104.1*  PLT 238 206 230 214 206   Cardiac Enzymes: No results for input(s): CKTOTAL, CKMB, CKMBINDEX, TROPONINI in the last 168 hours. CBG: Recent Labs  Lab 08/12/19 2045 08/12/19 2324 08/13/19 0314 08/13/19 0758 08/13/19 0855  GLUCAP 172* 144* 102* 57* 52*    Iron Studies: No results for input(s): IRON, TIBC, TRANSFERRIN, FERRITIN in the last 72 hours. Studies/Results: No results found.  Medications: Infusions: . sodium chloride    . sodium chloride    . sodium chloride    . sodium chloride      Scheduled Medications: .  stroke: mapping our early stages of recovery book   Does not  apply Once  . allopurinol  100 mg Oral Daily  . aspirin  81 mg Oral Daily  . atorvastatin  40 mg Oral Daily  . calcium acetate  1,334 mg Oral TID WC  . Chlorhexidine Gluconate Cloth  6 each Topical Q0600  . Chlorhexidine Gluconate Cloth  6 each Topical Q0600  . Chlorhexidine Gluconate Cloth  6 each Topical Q0600  . darbepoetin (ARANESP) injection - DIALYSIS  100 mcg Intravenous Q Wed-HD  . dextrose      . heparin  8,000 Units Dialysis Once in dialysis  . insulin aspart  0-6 Units Subcutaneous Q4H  . ipratropium  2 puff Inhalation Q8H  . levalbuterol  2 puff Inhalation Q8H  . metoCLOPramide  5 mg Oral TID WC  . metoprolol succinate  25 mg Oral Daily  . predniSONE  5 mg Oral Q breakfast  . sevelamer carbonate  2.4 g Oral TID WC  . sodium chloride flush  3 mL Intravenous Q12H  . Warfarin - Pharmacist Dosing Inpatient   Does not apply q1800    have reviewed scheduled  and prn medications.   Derenda Giddings Prasad Lasundra Hascall 08/13/2019,9:18 AM  LOS: 14 days  Pager: ID:[phone]

## 2019-08-13 NOTE — Progress Notes (Signed)
Hypoglycemic Event  CBG: 57  Treatment: 8 oz juice/soda, breakfast, applesauce  Symptoms: None  Follow-up CBG: Time:855 CBG Result:52  Possible Reasons for Event: Unknown  Comments/MD notified: Pt fed breakfast and juices. Dr. Nevada Crane notified Follow up CBG at 0932 @55 . IV access occluded and taken out. IV team paged for chest port access. CBG checked again 0946 @ 69. IM Glucagon ordered. CBG checked again 1002 @ 72.    Jeanella Craze

## 2019-08-13 NOTE — Progress Notes (Signed)
  Speech Language Pathology Treatment: Dysphagia  Patient Details Name: Jonathan Deets Sr. MRN: [id number] DOB: March 18, 1951 Today's Date: 08/13/2019 Time: [id number] SLP Time Calculation (min) (ACUTE ONLY): 10 min  Assessment / Plan / Recommendation Clinical Impression  Pt continues to demonstrate subtle signs of dysphagia with thin liquid trials via cup, including multiple subswallows and intermittent throat clearing. Attempted to have pt complete the Yale swallow screen for aspiration risk, but pt cognitively cannot complete this task. Despite Mod-Max cues for continuous drinking, he will only take one single sip at a time. While this bodes well for appropriate pacing during meals, it makes it more challenging to assess for aspiration risk clinically. Pt may benefit from instrumental testing prior to discharge, but at this point he still has limited participation, telling SLP "I don't like you" and trying to hide under his blanket. Will continue to follow.    HPI HPI: Shem Klouda Sr. is a 69 y.o. male with history of ESRD on hemodialysis, LV thrombus, renal transplant, stroke, hypertension diabetes mellitus was having increasing difficulty swallowing over the last 2 weeks as noticed by patient's wife (pocketing his food. Per chart 6 days ago diagnosed with Covid at the time he was not symptomatic. Now more short of breath and lethargic and confused. 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 Chest x-ray showing multifocal pneumonia.      SLP Plan  Continue with current plan of care       Recommendations  Diet recommendations: Dysphagia 1 (puree);Nectar-thick liquid Liquids provided via: Cup;No straw Medication Administration: Crushed with puree Supervision: Staff to assist with self feeding Compensations: Slow rate;Minimize environmental distractions;Small  sips/bites;Lingual sweep for clearance of pocketing Postural Changes and/or Swallow Maneuvers: Seated upright 90 degrees                Oral Care Recommendations: Oral care BID Follow up Recommendations: Skilled Nursing facility SLP Visit Diagnosis: Dysphagia, oropharyngeal phase (R13.12) Plan: Continue with current plan of care       GO                 Osie Bond., M.A. [place] [facility] Pager [phone] Office [phone]  08/13/2019, 4:34 PM

## 2019-08-14 LAB — RENAL FUNCTION PANEL
Albumin: 2.4 g/dL — ABNORMAL LOW (ref 3.5–5.0)
Anion gap: 10 (ref 5–15)
BUN: 51 mg/dL — ABNORMAL HIGH (ref 8–23)
CO2: 27 mmol/L (ref 22–32)
Calcium: 7.6 mg/dL — ABNORMAL LOW (ref 8.9–10.3)
Chloride: 98 mmol/L (ref 98–111)
Creatinine, Ser: 7.38 mg/dL — ABNORMAL HIGH (ref 0.61–1.24)
GFR calc Af Amer: 8 mL/min — ABNORMAL LOW (ref >60)
GFR calc non Af Amer: 7 mL/min — ABNORMAL LOW (ref >60)
Glucose, Bld: 77 mg/dL (ref 70–99)
Phosphorus: 2.9 mg/dL (ref 2.5–4.6)
Potassium: 4.8 mmol/L (ref 3.5–5.1)
Sodium: 135 mmol/L (ref 135–145)

## 2019-08-14 LAB — PROTIME-INR
INR: 2.8 — ABNORMAL HIGH (ref 0.8–1.2)
Prothrombin Time: 29.6 seconds — ABNORMAL HIGH (ref 11.4–15.2)

## 2019-08-14 LAB — COMPREHENSIVE METABOLIC PANEL
ALT: 11 U/L (ref 0–44)
AST: 7 U/L — ABNORMAL LOW (ref 15–41)
Albumin: 2.3 g/dL — ABNORMAL LOW (ref 3.5–5.0)
Alkaline Phosphatase: 54 U/L (ref 38–126)
Anion gap: 12 (ref 5–15)
BUN: 72 mg/dL — ABNORMAL HIGH (ref 8–23)
CO2: 23 mmol/L (ref 22–32)
Calcium: 7.7 mg/dL — ABNORMAL LOW (ref 8.9–10.3)
Chloride: 102 mmol/L (ref 98–111)
Creatinine, Ser: 9.69 mg/dL — ABNORMAL HIGH (ref 0.61–1.24)
GFR calc Af Amer: 6 mL/min — ABNORMAL LOW (ref >60)
GFR calc non Af Amer: 5 mL/min — ABNORMAL LOW (ref >60)
Glucose, Bld: 89 mg/dL (ref 70–99)
Potassium: 5.2 mmol/L — ABNORMAL HIGH (ref 3.5–5.1)
Sodium: 137 mmol/L (ref 135–145)
Total Bilirubin: 0.6 mg/dL (ref 0.3–1.2)
Total Protein: 5.7 g/dL — ABNORMAL LOW (ref 6.5–8.1)

## 2019-08-14 LAB — CBC
HCT: 26.4 % — ABNORMAL LOW (ref 39.0–52.0)
Hemoglobin: 8.4 g/dL — ABNORMAL LOW (ref 13.0–17.0)
MCH: 33.5 pg (ref 26.0–34.0)
MCHC: 31.8 g/dL (ref 30.0–36.0)
MCV: 105.2 fL — ABNORMAL HIGH (ref 80.0–100.0)
Platelets: 168 10*3/uL (ref 150–400)
RBC: 2.51 MIL/uL — ABNORMAL LOW (ref 4.22–5.81)
RDW: 15.4 % (ref 11.5–15.5)
WBC: 6.7 10*3/uL (ref 4.0–10.5)
nRBC: 0 % (ref 0.0–0.2)

## 2019-08-14 LAB — GLUCOSE, CAPILLARY
Glucose-Capillary: 192 mg/dL — ABNORMAL HIGH (ref 70–99)
Glucose-Capillary: 141 mg/dL — ABNORMAL HIGH (ref 70–99)
Glucose-Capillary: 88 mg/dL (ref 70–99)
Glucose-Capillary: 73 mg/dL (ref 70–99)
Glucose-Capillary: 89 mg/dL (ref 70–99)
Glucose-Capillary: 92 mg/dL (ref 70–99)

## 2019-08-14 LAB — PHOSPHORUS: Phosphorus: 3.5 mg/dL (ref 2.5–4.6)

## 2019-08-14 MED ORDER — WARFARIN SODIUM 2.5 MG PO TABS
2.5000 mg | ORAL_TABLET | Freq: Once | ORAL | Status: AC
  Administered 2019-08-14: 2.5 mg via ORAL
  Filled 2019-08-14: qty 1

## 2019-08-14 MED ORDER — RENA-VITE PO TABS
1.0000 | ORAL_TABLET | Freq: Every day | ORAL | Status: DC
  Administered 2019-08-14 – 2019-09-21 (×38): 1 via ORAL
  Filled 2019-08-14 (×39): qty 1

## 2019-08-14 MED ORDER — CHLORHEXIDINE GLUCONATE CLOTH 2 % EX PADS
6.0000 | MEDICATED_PAD | Freq: Every day | CUTANEOUS | Status: DC
  Administered 2019-08-14 – 2019-08-17 (×4): 6 via TOPICAL

## 2019-08-14 MED ORDER — PANTOPRAZOLE SODIUM 40 MG PO TBEC
40.0000 mg | DELAYED_RELEASE_TABLET | Freq: Every day | ORAL | Status: DC
  Administered 2019-08-14 – 2019-09-22 (×40): 40 mg via ORAL
  Filled 2019-08-14 (×40): qty 1

## 2019-08-14 NOTE — Progress Notes (Signed)
[place] for Warfarin Indication: h/o LV thrombus and new CVA   No Known Allergies  Patient Measurements: Height: 5\' 11"  (180.3 cm) Weight: 168 lb 10.4 oz (76.5 kg) IBW/kg (Calculated) : 75.3  Vital Signs: Temp: 98 F (36.7 C) (01/06 0815) Temp Source: Axillary (01/06 0815) BP: 120/75 (01/06 1000) Pulse Rate: 85 (01/06 1000)  Labs: Recent Labs    08/12/19 0630 08/12/19 0800 08/13/19 1808 08/14/19 0832 08/14/19 0833  HGB 8.2*  --   --   --  8.4*  HCT 25.3*  --   --   --  26.4*  PLT 206  --   --   --  168  LABPROT  --  31.2* 30.5* 29.6*  --   INR  --  3.0* 2.9* 2.8*  --   CREATININE 12.66*  --  9.21*  9.17* 9.69*  --     Estimated Creatinine Clearance: 8 mL/min (A) (by C-G formula based on SCr of 9.69 mg/dL (H)).   Assessment: 69 y.o. male admitted with acute respiratory failure, hx LV thrombus on Eliquis PTA]. Last Eliquis dose ~1000 on 12/22. Patient transitioned to heparin on admission with lower goals targeted due to new CVA (MRI confirmed) and risk of hemorrhagic conversion. Heparin infusion discontinued on 12/30 and patient transitioned to warfarin.   Pharmacy consulted to dose warfarin for LV thrombus.   INR is therapeutic at 2.8. No reported bleeding.   Goal of Therapy:  INR goal 2 - 3 Monitor platelets by anticoagulation protocol: Yes   Plan:  Warfarin 2.5 mg tonight  Daily INR, s/s bleeding Follow up ability to educate family member on warfarin   Eowyn Tabone A. Levada Dy, PharmD, BCPS, FNKF Clinical Pharmacist [place] Please utilize Amion for appropriate phone number to reach the unit pharmacist ([place])    08/14/2019 10:23 AM

## 2019-08-14 NOTE — Progress Notes (Signed)
PROGRESS NOTE  Jonathan Grady Sr. [id number] DOB: [birth year] DOA: 07/30/2019 PCP: Marton Redwood, MD  HPI/Recap of past 70 hours: 69 year old male with history of ESRD on hemodialysis, Tuesday Thursday and Saturday history of renal transplant, history of stroke, hypertension, type 2 diabetes mellitus, left ventricular thrombus came to ED with difficulty swallowing.  His wife noticed him having difficulty swallowing for about 2 weeks.  He was tested positive for COVID-19 on December 16.  Chest x-ray showed right upper and right lower lobe, left lower lobe interstitial infiltrate. Patient was noted with diagnosis of acute hypoxic respiratory failure due to SARS COVID-19 viral pneumonia. Further work-up with brain MRI showed small acute infarct in the left pons, left occipital lobe and bilateral frontal lobes.  Initially failed swallow evaluation but fortunately swallowing improved, patient placed on dysphagia 1 diet with aspiration precautions.  Oral anticoagulation was resumed after discussion question with Dr. Leonie Man from neurology.  08/14/19: Seen and examined.  No new complaints. Today is last day of quarantine. Appreciate CSW assistance with SNF placement.  Hemodialysis inpatient and outpatient per nephrology.    Assessment/Plan: Principal Problem:   Acute respiratory failure due to COVID-19 [facility]) Active Problems:   Chronic hepatitis C without hepatic coma ([name])   Diabetes mellitus type 2, uncomplicated ([place])   History of coronary artery disease   Hypertension   ESRD (end stage renal disease) on dialysis ([place])   Acute respiratory failure with hypoxia ([name])   Cerebral thrombosis with cerebral infarction   Cerebral embolism with cerebral infarction    Acute COVID-19 viral pneumonia Positive COVID-19 on 07/24/2019, last day 21-day quarantine 08/14/19. Completed course of remdesivir and Decadron Home O2 evaluation for DC planning Continue to maintain O2 saturation greater 92% Continue  bronchodilators O2 saturation 98% on 2 L.  Acute CVA left pons, left occipital lobe, bilateral frontal lobes. Possibly embolic phenomenon Continue aspirin 81 mg daily, Lipitor 40 mg daily and Coumadin with goal INR between 2 and 3. OT assessment recommended SNF CSW assisting with placement Continue PT OT with assistance and fall precautions.  ESRD on HD TTS Followed by nephrology Volume status addressed with hemodialysis Hemodialysis inpatient and outpatient per nephrology  History of LV thrombus On Coumadin, continue INR 2.8 Coumadin managed by pharmacy  Insulin-dependent type 2 diabetes with hypoglycemia Last A1c 6.3 Discontinue Lantus Discontinued insulin sliding scale today due to recurrent hypoglycemia Treated  History of renal transplant Continue prednisone On chronic steroids Add GI prophylaxis, p.o. Protonix 40 mg daily   DVT prophylaxis: Warfarin  Code Status: Full code  Family Communication:  Updated his wife via phone on 08/12/2019.  Disposition Plan: likely skilled nursing facility when bed becomes available.     Objective: Vitals:   08/14/19 0825 08/14/19 0900 08/14/19 0930 08/14/19 1000  BP: (!) 143/68 (!) 147/62 126/73 120/75  Pulse: 83 82 84 85  Resp: 17 18 17 16   Temp:      TempSrc:      SpO2: 98% 98% 98% 98%  Weight:      Height:        Intake/Output Summary (Last 24 hours) at 08/14/2019 1018 Last data filed at 08/14/2019 0800 Gross per 24 hour  Intake 100 ml  Output 425 ml  Net -325 ml   Filed Weights   08/12/19 0745 08/13/19 0600 08/14/19 0815  Weight: 72.3 kg 75 kg 76.5 kg    Exam:  . General: 69 y.o. year-old male well-developed well-nourished in no acute distress.  Alert and more  interactive today.   . Cardiovascular: Regular rate and rhythm no rubs or gallops.   Marland Kitchen Respiratory: Clear to auscultation no wheezes or rales.  .  Abdomen: Soft nontender normal bowel sounds present. Musculoskeletal: Trace lower extremity edema  bilaterally.   Psychiatry: Mood is appropriate for condition and setting.  Data Reviewed: CBC: Recent Labs  Lab 08/09/19 0314 08/10/19 0500 08/11/19 0332 08/12/19 0630 08/14/19 0833  WBC 8.8 9.8 8.9 8.2 6.7  NEUTROABS  --   --   --  5.8  --   HGB 8.1* 9.0* 8.2* 8.2* 8.4*  HCT 24.7* 27.9* 25.3* 25.3* 26.4*  MCV 104.7* 105.7* 106.3* 104.1* 105.2*  PLT 206 230 214 206 [id number]   Basic Metabolic Panel: Recent Labs  Lab 08/08/19 0500 08/10/19 0500 08/12/19 0630 08/13/19 1808 08/14/19 0832  NA 134* 139 137 136  136 137  K 5.3* 4.8 5.6* 5.7*  5.6* 5.2*  CL 94* 97* 95* 98  98 102  CO2 25 25 27 26  27 23   GLUCOSE 153* 157* 121* 206*  205* 89  BUN 55* 84* 112* 66*  66* 72*  CREATININE 7.56* 9.40* 12.66* 9.21*  9.17* 9.69*  CALCIUM 7.7* 8.1* 8.0* 7.7*  7.7* 7.7*  PHOS  --  4.5  --   --  3.5   GFR: Estimated Creatinine Clearance: 8 mL/min (A) (by C-G formula based on SCr of 9.69 mg/dL (H)). Liver Function Tests: Recent Labs  Lab 08/10/19 0500 08/12/19 0630 08/13/19 1808 08/14/19 0832  AST  --  <5* 12* 7*  ALT  --  10 10 11   ALKPHOS  --  63 55 54  BILITOT  --  0.4 0.5 0.6  PROT  --  5.6* 5.3* 5.7*  ALBUMIN 2.5* 2.1* 2.2* 2.3*   No results for input(s): LIPASE, AMYLASE in the last 168 hours. No results for input(s): AMMONIA in the last 168 hours. Coagulation Profile: Recent Labs  Lab 08/10/19 0500 08/11/19 0947 08/12/19 0800 08/13/19 1808 08/14/19 0832  INR 2.6* 2.6* 3.0* 2.9* 2.8*   Cardiac Enzymes: No results for input(s): CKTOTAL, CKMB, CKMBINDEX, TROPONINI in the last 168 hours. BNP (last 3 results) No results for input(s): PROBNP in the last 8760 hours. HbA1C: No results for input(s): HGBA1C in the last 72 hours. CBG: Recent Labs  Lab 08/13/19 1635 08/13/19 1952 08/14/19 0120 08/14/19 0513 08/14/19 0803  GLUCAP 197* 190* 192* 141* 88   Lipid Profile: No results for input(s): CHOL, HDL, LDLCALC, TRIG, CHOLHDL, LDLDIRECT in the last 72  hours. Thyroid Function Tests: No results for input(s): TSH, T4TOTAL, FREET4, T3FREE, THYROIDAB in the last 72 hours. Anemia Panel: No results for input(s): VITAMINB12, FOLATE, FERRITIN, TIBC, IRON, RETICCTPCT in the last 72 hours. Urine analysis: No results found for: COLORURINE, APPEARANCEUR, LABSPEC, PHURINE, GLUCOSEU, HGBUR, BILIRUBINUR, KETONESUR, PROTEINUR, UROBILINOGEN, NITRITE, LEUKOCYTESUR Sepsis Labs: @LABRCNTIP (procalcitonin:4,lacticidven:4)  ) No results found for this or any previous visit (from the past 240 hour(s)).    Studies: No results found.  Scheduled Meds: .  stroke: mapping our early stages of recovery book   Does not apply Once  . allopurinol  100 mg Oral Daily  . aspirin  81 mg Oral Daily  . atorvastatin  40 mg Oral Daily  . calcium acetate  1,334 mg Oral TID WC  . Chlorhexidine Gluconate Cloth  6 each Topical Q0600  . Chlorhexidine Gluconate Cloth  6 each Topical Q0600  . Chlorhexidine Gluconate Cloth  6 each Topical Q0600  . Chlorhexidine Gluconate Cloth  6 each Topical Q0600  . [START ON 08/15/2019] darbepoetin (ARANESP) injection - DIALYSIS  100 mcg Intravenous Q Thu-HD  . heparin  8,000 Units Dialysis Once in dialysis  . ipratropium  2 puff Inhalation Q8H  . levalbuterol  2 puff Inhalation Q8H  . metoCLOPramide  5 mg Oral TID WC  . metoprolol succinate  25 mg Oral Daily  . multivitamin  1 tablet Oral QHS  . predniSONE  5 mg Oral Q breakfast  . sevelamer carbonate  2.4 g Oral TID WC  . sodium chloride flush  3 mL Intravenous Q12H  . Warfarin - Pharmacist Dosing Inpatient   Does not apply q1800    Continuous Infusions: . sodium chloride    . sodium chloride    . sodium chloride    . sodium chloride       LOS: 15 days     Kayleen Memos, MD [facility] Pager [phone]  If 7PM-7AM, please contact night-coverage [email or web address] Password [email or web address] 08/14/2019, 10:18 AM

## 2019-08-14 NOTE — Progress Notes (Signed)
[facility] NEPHROLOGY PROGRESS NOTE  Assessment/ Plan: Pt is a 69 y.o. yo male ESRD on HD TTS, HTN, stroke, DM, who with COVID-19 pneumonia.  Dialysis OP: Currently GO TTS2 - previously [facility]  4h 68min  400/800   80kg  2/2 bath R AVF  Hep 8000 venofer 50/week + Mircera 75 last 12/17, parsabiv 7.5, no calcitriol   #Acute COVID-19 pneumonia: Completed course of remdesivir and Decadron.  As per primary team.  # ESRD, TTS; completed dialysis early morning today.  Plan to continue TTS schedule if schedule allows.   # Anemia due to CKD: Continue Aranesp.  # Secondary hyperparathyroidism: Continue binders.  Monitor phosphorus level.  # HTN/volume: Blood pressure acceptable.  #Acute stroke: Probably embolic phenomena.  On aspirin Lipitor and Coumadin.  OT PT evaluation.  Awaiting SNF placement.  Subjective: Chart and lab results reviewed.  Patient was directly not examined because of COVID-19 status in order to minimize exposure and to preserve PPE.  Discussed with primary caretakers and reviewed the note. Objective Vital signs in last 24 hours: Vitals:   08/14/19 0815 08/14/19 0820 08/14/19 0825 08/14/19 0900  BP: (!) 148/62 135/76 (!) 143/68 (!) 147/62  Pulse: 90 89 83 82  Resp: 18 15 17 18   Temp: 98 F (36.7 C)     TempSrc: Axillary     SpO2: 98% 95% 98% 98%  Weight: 76.5 kg     Height:       Weight change:   Intake/Output Summary (Last 24 hours) at 08/14/2019 0949 Last data filed at 08/14/2019 0539 Gross per 24 hour  Intake 100 ml  Output --  Net 100 ml       Labs: Basic Metabolic Panel: Recent Labs  Lab 08/10/19 0500 08/12/19 0630 08/13/19 1808 08/14/19 0832  NA 139 137 136  136 137  K 4.8 5.6* 5.7*  5.6* 5.2*  CL 97* 95* 98  98 102  CO2 25 27 26  27 23   GLUCOSE 157* 121* 206*  205* 89  BUN 84* 112* 66*  66* 72*  CREATININE 9.40* 12.66* 9.21*  9.17* 9.69*  CALCIUM 8.1* 8.0* 7.7*  7.7* 7.7*  PHOS 4.5  --   --  3.5   Liver Function  Tests: Recent Labs  Lab 08/12/19 0630 08/13/19 1808 08/14/19 0832  AST <5* 12* 7*  ALT 10 10 11   ALKPHOS 63 55 54  BILITOT 0.4 0.5 0.6  PROT 5.6* 5.3* 5.7*  ALBUMIN 2.1* 2.2* 2.3*   No results for input(s): LIPASE, AMYLASE in the last 168 hours. No results for input(s): AMMONIA in the last 168 hours. CBC: Recent Labs  Lab 08/09/19 0314 08/10/19 0500 08/11/19 0332 08/12/19 0630 08/14/19 0833  WBC 8.8 9.8 8.9 8.2 6.7  NEUTROABS  --   --   --  5.8  --   HGB 8.1* 9.0* 8.2* 8.2* 8.4*  HCT 24.7* 27.9* 25.3* 25.3* 26.4*  MCV 104.7* 105.7* 106.3* 104.1* 105.2*  PLT 206 230 214 206 168   Cardiac Enzymes: No results for input(s): CKTOTAL, CKMB, CKMBINDEX, TROPONINI in the last 168 hours. CBG: Recent Labs  Lab 08/13/19 1635 08/13/19 1952 08/14/19 0120 08/14/19 0513 08/14/19 0803  GLUCAP 197* 190* 192* 141* 88    Iron Studies: No results for input(s): IRON, TIBC, TRANSFERRIN, FERRITIN in the last 72 hours. Studies/Results: No results found.  Medications: Infusions: . sodium chloride    . sodium chloride    . sodium chloride    . sodium chloride  Scheduled Medications: .  stroke: mapping our early stages of recovery book   Does not apply Once  . allopurinol  100 mg Oral Daily  . aspirin  81 mg Oral Daily  . atorvastatin  40 mg Oral Daily  . calcium acetate  1,334 mg Oral TID WC  . Chlorhexidine Gluconate Cloth  6 each Topical Q0600  . Chlorhexidine Gluconate Cloth  6 each Topical Q0600  . Chlorhexidine Gluconate Cloth  6 each Topical Q0600  . [START ON 08/15/2019] darbepoetin (ARANESP) injection - DIALYSIS  100 mcg Intravenous Q Thu-HD  . heparin  8,000 Units Dialysis Once in dialysis  . ipratropium  2 puff Inhalation Q8H  . levalbuterol  2 puff Inhalation Q8H  . metoCLOPramide  5 mg Oral TID WC  . metoprolol succinate  25 mg Oral Daily  . predniSONE  5 mg Oral Q breakfast  . sevelamer carbonate  2.4 g Oral TID WC  . sodium chloride flush  3 mL Intravenous  Q12H  . Warfarin - Pharmacist Dosing Inpatient   Does not apply q1800    have reviewed scheduled and prn medications.   Khalia Gong Prasad Kimberl Vig 08/14/2019,9:49 AM  LOS: 15 days  Pager: ID:[phone]

## 2019-08-15 LAB — GLUCOSE, CAPILLARY
Glucose-Capillary: 106 mg/dL — ABNORMAL HIGH (ref 70–99)
Glucose-Capillary: 140 mg/dL — ABNORMAL HIGH (ref 70–99)
Glucose-Capillary: 140 mg/dL — ABNORMAL HIGH (ref 70–99)
Glucose-Capillary: 238 mg/dL — ABNORMAL HIGH (ref 70–99)
Glucose-Capillary: 70 mg/dL (ref 70–99)

## 2019-08-15 LAB — PROTIME-INR
INR: 2.9 — ABNORMAL HIGH (ref 0.8–1.2)
Prothrombin Time: 30 seconds — ABNORMAL HIGH (ref 11.4–15.2)

## 2019-08-15 LAB — RENAL FUNCTION PANEL
Albumin: 2.5 g/dL — ABNORMAL LOW (ref 3.5–5.0)
Anion gap: 12 (ref 5–15)
BUN: 58 mg/dL — ABNORMAL HIGH (ref 8–23)
CO2: 28 mmol/L (ref 22–32)
Calcium: 8.2 mg/dL — ABNORMAL LOW (ref 8.9–10.3)
Chloride: 98 mmol/L (ref 98–111)
Creatinine, Ser: 8.91 mg/dL — ABNORMAL HIGH (ref 0.61–1.24)
GFR calc Af Amer: 6 mL/min — ABNORMAL LOW (ref >60)
GFR calc non Af Amer: 6 mL/min — ABNORMAL LOW (ref >60)
Glucose, Bld: 79 mg/dL (ref 70–99)
Phosphorus: 4 mg/dL (ref 2.5–4.6)
Potassium: 5.1 mmol/L (ref 3.5–5.1)
Sodium: 138 mmol/L (ref 135–145)

## 2019-08-15 LAB — CBC
HCT: 27 % — ABNORMAL LOW (ref 39.0–52.0)
Hemoglobin: 8.8 g/dL — ABNORMAL LOW (ref 13.0–17.0)
MCH: 34 pg (ref 26.0–34.0)
MCHC: 32.6 g/dL (ref 30.0–36.0)
MCV: 104.2 fL — ABNORMAL HIGH (ref 80.0–100.0)
Platelets: 172 10*3/uL (ref 150–400)
RBC: 2.59 MIL/uL — ABNORMAL LOW (ref 4.22–5.81)
RDW: 15.5 % (ref 11.5–15.5)
WBC: 6 10*3/uL (ref 4.0–10.5)
nRBC: 0 % (ref 0.0–0.2)

## 2019-08-15 MED ORDER — SODIUM CHLORIDE 0.9 % IV SOLN: 100.0000 mL | INTRAVENOUS | Status: DC | PRN

## 2019-08-15 MED ORDER — HEPARIN SODIUM (PORCINE) 1000 UNIT/ML DIALYSIS: 1000.0000 [IU] | INTRAMUSCULAR | Status: DC | PRN

## 2019-08-15 MED ORDER — PENTAFLUOROPROP-TETRAFLUOROETH EX AERO: 1.0000 "application " | INHALATION_SPRAY | CUTANEOUS | Status: DC | PRN

## 2019-08-15 MED ORDER — LIDOCAINE-PRILOCAINE 2.5-2.5 % EX CREA: 1.0000 "application " | TOPICAL_CREAM | CUTANEOUS | Status: DC | PRN

## 2019-08-15 MED ORDER — WARFARIN SODIUM 2 MG PO TABS
2.0000 mg | ORAL_TABLET | Freq: Once | ORAL | Status: AC
  Administered 2019-08-15: 19:00:00 2 mg via ORAL
  Filled 2019-08-15: qty 1

## 2019-08-15 MED ORDER — ALTEPLASE 2 MG IJ SOLR: 2.0000 mg | Freq: Once | INTRAMUSCULAR | Status: DC | PRN

## 2019-08-15 MED ORDER — LIDOCAINE HCL (PF) 1 % IJ SOLN: 5.0000 mL | INTRAMUSCULAR | Status: DC | PRN

## 2019-08-15 NOTE — Progress Notes (Signed)
[place] for Warfarin Indication: h/o LV thrombus and new CVA   No Known Allergies  Patient Measurements: Height: 5\' 11"  (180.3 cm) Weight: 159 lb 2.8 oz (72.2 kg) IBW/kg (Calculated) : 75.3  Vital Signs: Temp: 98.6 F (37 C) (01/07 0456) Temp Source: Oral (01/07 0008) BP: 130/72 (01/07 0456) Pulse Rate: 83 (01/07 0400)  Labs: Recent Labs    08/13/19 1808 08/14/19 0832 08/14/19 0833 08/14/19 1053 08/15/19 0345  HGB  --   --  8.4*  --   --   HCT  --   --  26.4*  --   --   PLT  --   --  168  --   --   LABPROT 30.5* 29.6*  --   --  30.0*  INR 2.9* 2.8*  --   --  2.9*  CREATININE 9.21*  9.17* 9.69*  --  7.38*  --     Estimated Creatinine Clearance: 10.1 mL/min (A) (by C-G formula based on SCr of 7.38 mg/dL (H)).   Assessment: 69 y.o. male admitted with acute respiratory failure, hx LV thrombus on Eliquis PTA]. Last Eliquis dose ~1000 on 12/22. Patient transitioned to heparin on admission with lower goals targeted due to new CVA (MRI confirmed) and risk of hemorrhagic conversion. Heparin infusion discontinued on 12/30 and patient transitioned to warfarin.   Pharmacy consulted to dose warfarin for LV thrombus.   INR is therapeutic at 2.9. No reported bleeding.  Spoke to patient's wife re: warfarin education. Patient has been on warfarin in the past.    Goal of Therapy:  INR goal 2 - 3 Monitor platelets by anticoagulation protocol: Yes   Plan:  Warfarin 2 mg tonight  Daily INR, s/s bleeding   Jadyn Brasher A. Levada Dy, PharmD, BCPS, FNKF Clinical Pharmacist [place] Please utilize Amion for appropriate phone number to reach the unit pharmacist ([place])    08/15/2019 8:37 AM

## 2019-08-15 NOTE — Progress Notes (Signed)
[facility] NEPHROLOGY PROGRESS NOTE  Assessment/ Plan: Pt is a 69 y.o. yo male ESRD on HD TTS, HTN, stroke, DM, who with COVID-19 pneumonia.  Dialysis OP: Currently GO TTS2 - previously [facility]  4h 2min  400/800   80kg  2/2 bath R AVF  Hep 8000 venofer 50/week + Mircera 75 last 12/17, parsabiv 7.5, no calcitriol    #Acute COVID-19 pneumonia: s/p remdesivir and Decadron.  As per primary team.  # ESRD - HD per TTS schedule  # Anemia due to CKD: Continue Aranesp; stable.  # Secondary hyperparathyroidism: Continue binders.  Monitor phosphorus level.  # HTN/volume: acceptable; UF with HD as able  #Acute stroke: Probably embolic phenomena.  On aspirin Lipitor and Coumadin.  OT PT evaluation.  Awaiting SNF placement   Subjective:  Due to the nature of this patient's COVID-19 with isolation and in keeping with efforts to prevent the spread of infection and to conserve personal protective equipment, a review of systems was not personally performed.  Patient's symptoms were discussed in detail with the RN and chart was reviewed.  Has been 94% on 2 liters - intermittent use as well with sometimes off oxygen.  Hasn't been getting up.   Note primary team states he is more interactive today.  Review of systems:  No n/v No Shortness of breath  Right sided weakness    Objective Vital signs in last 24 hours: Vitals:   08/15/19 0400 08/15/19 0456 08/15/19 0539 08/15/19 0800  BP: 134/71 130/72  (!) 146/68  Pulse: 83   77  Resp:      Temp:  98.6 F (37 C)    TempSrc:      SpO2: 91%   92%  Weight:   72.2 kg   Height:       Weight change:   Intake/Output Summary (Last 24 hours) at 08/15/2019 1210 Last data filed at 08/15/2019 0535 Gross per 24 hour  Intake 120 ml  Output --  Net 120 ml   Physical exam per nursing General adult male in bed in no acute distress Lungs clear to auscultation bilaterally normal work of breathing at rest  Heart regular rate and  rhythm Extremities no edema; RUE AVF and a right chest tunneled catheter Neuro - alert and oriented to self and follows commands Psych - intermittent agitation; no aggression  Labs: Basic Metabolic Panel: Recent Labs  Lab 08/14/19 0832 08/14/19 1053 08/15/19 0929  NA 137 135 138  K 5.2* 4.8 5.1  CL 102 98 98  CO2 23 27 28   GLUCOSE 89 77 79  BUN 72* 51* 58*  CREATININE 9.69* 7.38* 8.91*  CALCIUM 7.7* 7.6* 8.2*  PHOS 3.5 2.9 4.0   Liver Function Tests: Recent Labs  Lab 08/12/19 0630 08/13/19 1808 08/14/19 0832 08/14/19 1053 08/15/19 0929  AST <5* 12* 7*  --   --   ALT 10 10 11   --   --   ALKPHOS 63 55 54  --   --   BILITOT 0.4 0.5 0.6  --   --   PROT 5.6* 5.3* 5.7*  --   --   ALBUMIN 2.1* 2.2* 2.3* 2.4* 2.5*   CBC: Recent Labs  Lab 08/10/19 0500 08/11/19 0332 08/12/19 0630 08/14/19 0833 08/15/19 0929  WBC 9.8 8.9 8.2 6.7 6.0  NEUTROABS  --   --  5.8  --   --   HGB 9.0* 8.2* 8.2* 8.4* 8.8*  HCT 27.9* 25.3* 25.3* 26.4* 27.0*  MCV 105.7*  106.3* 104.1* 105.2* 104.2*  PLT 230 214 206 168 172   Cardiac Enzymes: No results for input(s): CKTOTAL, CKMB, CKMBINDEX, TROPONINI in the last 168 hours. CBG: Recent Labs  Lab 08/14/19 1647 08/14/19 2016 08/15/19 0004 08/15/19 0458 08/15/19 0737  GLUCAP 89 92 140* 106* 70    Iron Studies: No results for input(s): IRON, TIBC, TRANSFERRIN, FERRITIN in the last 72 hours. Studies/Results: No results found.  Medications: Infusions: . sodium chloride    . sodium chloride      Scheduled Medications: .  stroke: mapping our early stages of recovery book   Does not apply Once  . allopurinol  100 mg Oral Daily  . aspirin  81 mg Oral Daily  . atorvastatin  40 mg Oral Daily  . calcium acetate  1,334 mg Oral TID WC  . Chlorhexidine Gluconate Cloth  6 each Topical Q0600  . darbepoetin (ARANESP) injection - DIALYSIS  100 mcg Intravenous Q Thu-HD  . heparin  8,000 Units Dialysis Once in dialysis  . ipratropium  2 puff  Inhalation Q8H  . levalbuterol  2 puff Inhalation Q8H  . metoCLOPramide  5 mg Oral TID WC  . metoprolol succinate  25 mg Oral Daily  . multivitamin  1 tablet Oral QHS  . pantoprazole  40 mg Oral Daily  . predniSONE  5 mg Oral Q breakfast  . sevelamer carbonate  2.4 g Oral TID WC  . sodium chloride flush  3 mL Intravenous Q12H  . warfarin  2 mg Oral ONCE-1800  . Warfarin - Pharmacist Dosing Inpatient   Does not apply q1800    have reviewed scheduled and prn medications.   Marlane Hatcher Donta Fuster 08/15/2019,12:10 PM  LOS: 16 days

## 2019-08-15 NOTE — Progress Notes (Signed)
Physical Therapy Treatment Patient Details Name: Jonathan [name]. MRN: [id number] DOB: [birth year] Today's Date: 08/15/2019    History of Present Illness 69 y.o. 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 Diagnosed with COVID 6 days prior to coming to ED, where chest x-ray showed multifocal pneumonia. Admitted 07/30/19 for treatment of acute encephalopathy, difficulty swallowing and pneumonia.MRI of the brain showed small acute infarcts in the left pons, left occipital lob and right frontal lobe in addition to chronic infarcts     PT Comments    Pt initially agreeable to working with therapy to get to recliner. Pt min guard with bed mobility. Once EoB, pt stated he disliked therapist and would not accept help from therapist, despite working with him before with no problems. Pt sat EoB 8 minutes while PT provided max encouragement for mobility. Pt ultimately decided to return to bed. Pt min guard for bed mobility back to bed. D/c plans remain appropriate at this time. PT will continue to follow acutely.   Follow Up Recommendations  SNF     Equipment Recommendations  None recommended by PT       Precautions / Restrictions Precautions Precautions: Fall Restrictions Weight Bearing Restrictions: No    Mobility  Bed Mobility Overal bed mobility: Needs Assistance Bed Mobility: Sit to Supine     Supine to sit: Min guard Sit to supine: Min guard   General bed mobility comments: min guard for safety, pt able to get LE off bed but took increased time to figure out how to scoot his hips forward so his feet would touch the ground. Pt would not allow PT to attempt assist to recliner, and ultimately agreed to get back in bed, with bed placed in [place] pt able to scoot himself up in bed   Transfers                  General transfer comment: pt refused assist to chair      Balance Overall balance assessment: Needs assistance Sitting-balance support: Feet supported;No upper extremity supported Sitting balance-Leahy Scale: Fair                                      Cognition Arousal/Alertness: Awake/alert Behavior During Therapy: Flat affect;Agitated Overall Cognitive Status: Impaired/Different from baseline Area of Impairment: Orientation;Memory;Safety/judgement;Awareness;Following commands;Problem solving;Attention                 Orientation Level: Disoriented to;Time;Place;Situation Current Attention Level: Sustained Memory: Decreased short-term memory Following Commands: Follows one step commands with increased time Safety/Judgement: Decreased awareness of safety;Decreased awareness of deficits Awareness: Intellectual Problem Solving: Slow processing;Decreased initiation;Difficulty sequencing;Requires verbal cues;Requires tactile cues General Comments: patient stated "I don't like you, please don't touch me" when PT attempted to help him to chair          General Comments General comments (skin integrity, edema, etc.): on 1L via [place] aO2 93%O2 removed and pt able to maintain SaO2 90-91%O2, sat EoB for 8 minutes       Pertinent Vitals/Pain Pain Assessment: Faces Faces Pain Scale: No hurt           PT Goals (current goals can now be found in the care plan section) Acute Rehab PT Goals Patient Stated Goal: Pt unable to  state  PT Goal Formulation: Patient unable to participate in goal setting Potential to Achieve Goals: Good Progress towards PT goals: Not progressing toward goals - comment(pt refuses assist from PT to progress mobility)    Frequency    Min 2X/week      PT Plan Current plan remains appropriate       AM-PAC PT "6 Clicks" Mobility   Outcome Measure  Help needed turning from your back to your side while in a flat bed  without using bedrails?: A Little Help needed moving from lying on your back to sitting on the side of a flat bed without using bedrails?: A Little Help needed moving to and from a bed to a chair (including a wheelchair)?: A Lot Help needed standing up from a chair using your arms (e.g., wheelchair or bedside chair)?: A Lot Help needed to walk in hospital room?: Total Help needed climbing 3-5 steps with a railing? : Total 6 Click Score: 12    End of Session   Activity Tolerance: Treatment limited secondary to agitation Patient left: in bed;with call bell/phone within reach;with bed alarm set Nurse Communication: Mobility status PT Visit Diagnosis: Unsteadiness on feet (R26.81);Other abnormalities of gait and mobility (R26.89);Muscle weakness (generalized) (M62.81);Ataxic gait (R26.0);Difficulty in walking, not elsewhere classified (R26.2)     Time: [id number] PT Time Calculation (min) (ACUTE ONLY): 24 min  Charges:  $Therapeutic Activity: 23-37 mins                     Fallan Mccarey B. Migdalia Dk PT, DPT Acute Rehabilitation Services Pager [phone] Office [phone]    [place] 08/15/2019, 4:15 PM

## 2019-08-15 NOTE — Progress Notes (Signed)
PROGRESS NOTE  Jonathan Ramamurthy Sr. [id number] DOB: 13-May-1951 DOA: 07/30/2019 PCP: Marton Redwood, MD  HPI/Recap of past 2 hours: 69 year old male with history of ESRD on hemodialysis, Tuesday Thursday and Saturday history of renal transplant, history of stroke, hypertension, type 2 diabetes mellitus, left ventricular thrombus came to ED with difficulty swallowing.  His wife noticed him having difficulty swallowing for about 2 weeks.  He was tested positive for COVID-19 on December 16.  Chest x-ray showed right upper and right lower lobe, left lower lobe interstitial infiltrate. Patient was noted with diagnosis of acute hypoxic respiratory failure due to SARS COVID-19 viral pneumonia. Further work-up with brain MRI showed small acute infarct in the left pons, left occipital lobe and bilateral frontal lobes.  Initially failed swallow evaluation but fortunately swallowing improved, patient placed on dysphagia 1 diet with aspiration precautions.  Oral anticoagulation was resumed after discussion question with Dr. Leonie Man from neurology.  08/15/19: Seen and examined.  No acute events overnight.  He is more interactive today.  He has no new complaints.  Possible HD today.  Out of 21-day window for quarantine.  Will transfer out of COVID-19 unit.    Assessment/Plan: Principal Problem:   Acute respiratory failure due to COVID-19 [facility]) Active Problems:   Chronic hepatitis C without hepatic coma ([name])   Diabetes mellitus type 2, uncomplicated ([place])   History of coronary artery disease   Hypertension   ESRD (end stage renal disease) on dialysis ([place])   Acute respiratory failure with hypoxia ([name])   Cerebral thrombosis with cerebral infarction   Cerebral embolism with cerebral infarction    Acute COVID-19 viral pneumonia Positive COVID-19 on 07/24/2019, last day 21-day quarantine 08/14/19. Completed course of remdesivir and Decadron O2 saturation 100% on 2 L. Currently on bronchodilators and  chronic steroid for his renal transplant.  Acute CVA left pons, left occipital lobe, bilateral frontal lobes. Possibly embolic phenomenon Continue aspirin 81 mg daily, Lipitor 40 mg daily and Coumadin with goal INR between 2 and 3. OT assessment recommended SNF CSW assisting with placement Continue PT OT with assistance and fall precautions.  ESRD on HD TTS Followed by nephrology Volume status addressed with hemodialysis Hemodialysis inpatient and outpatient per nephrology Possible hemodialysis today 08/15/2019.  History of LV thrombus On Coumadin, continue INR is therapeutic 2.9. Coumadin managed by pharmacy  Insulin-dependent type 2 diabetes with hypoglycemia Last A1c 6.3 Hypoglycemia resolved off insulin. CBGs now stable.  History of renal transplant Continue prednisone Added p.o. Protonix 40 mg daily since on chronic steroids.   DVT prophylaxis: Warfarin  Code Status: Full code  Family Communication:  Updated his wife via phone.  Will call again.  Disposition Plan: likely skilled nursing facility when bed becomes available and has a secured HD outpatient spot.     Objective: Vitals:   08/15/19 0400 08/15/19 0456 08/15/19 0539 08/15/19 0800  BP: 134/71 130/72  (!) 146/68  Pulse: 83   77  Resp:      Temp:  98.6 F (37 C)    TempSrc:      SpO2: 91%   92%  Weight:   72.2 kg   Height:        Intake/Output Summary (Last 24 hours) at 08/15/2019 1104 Last data filed at 08/15/2019 0535 Gross per 24 hour  Intake 120 ml  Output --  Net 120 ml   Filed Weights   08/14/19 0815 08/14/19 1050 08/15/19 0539  Weight: 76.5 kg 76.5 kg 72.2 kg  Exam:  . General: 69 y.o. year-old male pleasant well-developed well-nourished in no acute distress.  Alert and interactive.   . Cardiovascular: Regular rate and rhythm no rubs or gallops. Marland Kitchen Respiratory: Clear to auscultation no wheezes or rales.  Poor inspiratory effort.  Abdomen: Soft nontender normal bowel sounds  present.   Musculoskeletal: Trace lower extremity edema bilaterally.   Psychiatry: Mood is appropriate for condition and setting. Data Reviewed: CBC: Recent Labs  Lab 08/10/19 0500 08/11/19 0332 08/12/19 0630 08/14/19 0833 08/15/19 0929  WBC 9.8 8.9 8.2 6.7 6.0  NEUTROABS  --   --  5.8  --   --   HGB 9.0* 8.2* 8.2* 8.4* 8.8*  HCT 27.9* 25.3* 25.3* 26.4* 27.0*  MCV 105.7* 106.3* 104.1* 105.2* 104.2*  PLT 230 214 206 168 [id number]   Basic Metabolic Panel: Recent Labs  Lab 08/10/19 0500 08/12/19 0630 08/13/19 1808 08/14/19 0832 08/14/19 1053 08/15/19 0929  NA 139 137 136  136 137 135 138  K 4.8 5.6* 5.7*  5.6* 5.2* 4.8 5.1  CL 97* 95* 98  98 102 98 98  CO2 25 27 26  27 23 27 28   GLUCOSE 157* 121* 206*  205* 89 77 79  BUN 84* 112* 66*  66* 72* 51* 58*  CREATININE 9.40* 12.66* 9.21*  9.17* 9.69* 7.38* 8.91*  CALCIUM 8.1* 8.0* 7.7*  7.7* 7.7* 7.6* 8.2*  PHOS 4.5  --   --  3.5 2.9 4.0   GFR: Estimated Creatinine Clearance: 8.3 mL/min (A) (by C-G formula based on SCr of 8.91 mg/dL (H)). Liver Function Tests: Recent Labs  Lab 08/12/19 0630 08/13/19 1808 08/14/19 0832 08/14/19 1053 08/15/19 0929  AST <5* 12* 7*  --   --   ALT 10 10 11   --   --   ALKPHOS 63 55 54  --   --   BILITOT 0.4 0.5 0.6  --   --   PROT 5.6* 5.3* 5.7*  --   --   ALBUMIN 2.1* 2.2* 2.3* 2.4* 2.5*   No results for input(s): LIPASE, AMYLASE in the last 168 hours. No results for input(s): AMMONIA in the last 168 hours. Coagulation Profile: Recent Labs  Lab 08/11/19 0947 08/12/19 0800 08/13/19 1808 08/14/19 0832 08/15/19 0345  INR 2.6* 3.0* 2.9* 2.8* 2.9*   Cardiac Enzymes: No results for input(s): CKTOTAL, CKMB, CKMBINDEX, TROPONINI in the last 168 hours. BNP (last 3 results) No results for input(s): PROBNP in the last 8760 hours. HbA1C: No results for input(s): HGBA1C in the last 72 hours. CBG: Recent Labs  Lab 08/14/19 1647 08/14/19 2016 08/15/19 0004 08/15/19 0458  08/15/19 0737  GLUCAP 89 92 140* 106* 70   Lipid Profile: No results for input(s): CHOL, HDL, LDLCALC, TRIG, CHOLHDL, LDLDIRECT in the last 72 hours. Thyroid Function Tests: No results for input(s): TSH, T4TOTAL, FREET4, T3FREE, THYROIDAB in the last 72 hours. Anemia Panel: No results for input(s): VITAMINB12, FOLATE, FERRITIN, TIBC, IRON, RETICCTPCT in the last 72 hours. Urine analysis: No results found for: COLORURINE, APPEARANCEUR, LABSPEC, PHURINE, GLUCOSEU, HGBUR, BILIRUBINUR, KETONESUR, PROTEINUR, UROBILINOGEN, NITRITE, LEUKOCYTESUR Sepsis Labs: @LABRCNTIP (procalcitonin:4,lacticidven:4)  ) No results found for this or any previous visit (from the past 240 hour(s)).    Studies: No results found.  Scheduled Meds: .  stroke: mapping our early stages of recovery book   Does not apply Once  . allopurinol  100 mg Oral Daily  . aspirin  81 mg Oral Daily  . atorvastatin  40 mg Oral Daily  .  calcium acetate  1,334 mg Oral TID WC  . Chlorhexidine Gluconate Cloth  6 each Topical Q0600  . darbepoetin (ARANESP) injection - DIALYSIS  100 mcg Intravenous Q Thu-HD  . heparin  8,000 Units Dialysis Once in dialysis  . ipratropium  2 puff Inhalation Q8H  . levalbuterol  2 puff Inhalation Q8H  . metoCLOPramide  5 mg Oral TID WC  . metoprolol succinate  25 mg Oral Daily  . multivitamin  1 tablet Oral QHS  . pantoprazole  40 mg Oral Daily  . predniSONE  5 mg Oral Q breakfast  . sevelamer carbonate  2.4 g Oral TID WC  . sodium chloride flush  3 mL Intravenous Q12H  . warfarin  2 mg Oral ONCE-1800  . Warfarin - Pharmacist Dosing Inpatient   Does not apply q1800    Continuous Infusions: . sodium chloride    . sodium chloride       LOS: 16 days     Kayleen Memos, MD [facility] Pager [phone]  If 7PM-7AM, please contact night-coverage [email or web address] Password [facility] 08/15/2019, 11:04 AM

## 2019-08-15 NOTE — Progress Notes (Signed)
Updated the patient's spouse Ms. [name] via phone.  All questions answered.

## 2019-08-16 LAB — GLUCOSE, CAPILLARY
Glucose-Capillary: 151 mg/dL — ABNORMAL HIGH (ref 70–99)
Glucose-Capillary: 124 mg/dL — ABNORMAL HIGH (ref 70–99)
Glucose-Capillary: 104 mg/dL — ABNORMAL HIGH (ref 70–99)
Glucose-Capillary: 89 mg/dL (ref 70–99)
Glucose-Capillary: 113 mg/dL — ABNORMAL HIGH (ref 70–99)

## 2019-08-16 LAB — PROTIME-INR
INR: 2.9 — ABNORMAL HIGH (ref 0.8–1.2)
Prothrombin Time: 30.4 seconds — ABNORMAL HIGH (ref 11.4–15.2)

## 2019-08-16 LAB — RENAL FUNCTION PANEL
Albumin: 2.6 g/dL — ABNORMAL LOW (ref 3.5–5.0)
Anion gap: 10 (ref 5–15)
BUN: 48 mg/dL — ABNORMAL HIGH (ref 8–23)
CO2: 27 mmol/L (ref 22–32)
Calcium: 7.8 mg/dL — ABNORMAL LOW (ref 8.9–10.3)
Chloride: 97 mmol/L — ABNORMAL LOW (ref 98–111)
Creatinine, Ser: 7.67 mg/dL — ABNORMAL HIGH (ref 0.61–1.24)
GFR calc Af Amer: 8 mL/min — ABNORMAL LOW (ref >60)
GFR calc non Af Amer: 7 mL/min — ABNORMAL LOW (ref >60)
Glucose, Bld: 124 mg/dL — ABNORMAL HIGH (ref 70–99)
Phosphorus: 2.3 mg/dL — ABNORMAL LOW (ref 2.5–4.6)
Potassium: 4 mmol/L (ref 3.5–5.1)
Sodium: 134 mmol/L — ABNORMAL LOW (ref 135–145)

## 2019-08-16 LAB — HEPATITIS B SURFACE ANTIBODY,QUALITATIVE: Hep B S Ab: NONREACTIVE

## 2019-08-16 LAB — HEPATITIS B CORE ANTIBODY, TOTAL: Hep B Core Total Ab: NONREACTIVE

## 2019-08-16 LAB — HEPATITIS B SURFACE ANTIGEN: Hepatitis B Surface Ag: NONREACTIVE

## 2019-08-16 MED ORDER — WARFARIN SODIUM 2 MG PO TABS
2.0000 mg | ORAL_TABLET | Freq: Once | ORAL | Status: AC
  Administered 2019-08-16: 18:00:00 2 mg via ORAL
  Filled 2019-08-16: qty 1

## 2019-08-16 MED ORDER — DARBEPOETIN ALFA 100 MCG/0.5ML IJ SOSY: 100.0000 ug | PREFILLED_SYRINGE | INTRAMUSCULAR | Status: DC

## 2019-08-16 MED ORDER — LEVALBUTEROL TARTRATE 45 MCG/ACT IN AERO
2.0000 | INHALATION_SPRAY | Freq: Three times a day (TID) | RESPIRATORY_TRACT | Status: DC | PRN
  Filled 2019-08-16: qty 15

## 2019-08-16 NOTE — Progress Notes (Signed)
  Speech Language Pathology Treatment: Dysphagia  Patient Details Name: Jonathan Goad Sr. MRN: [id number] DOB: [birth year] Today's Date: 08/16/2019 Time: [id number] SLP Time Calculation (min) (ACUTE ONLY): 15 min  Assessment / Plan / Recommendation Clinical Impression  Pt was more interactive with SLP today and tried more solid boluses (graham crackers) with mild oral residue, for which SLP provided Min-Mod cues to clear. He still does not drink three ounces of water without stopping and clearing his throat. Outwardly he appears to have a swifter swallow with nectar thick liquids; thin liquids look more discoordinated. If his participation keeps up, I think MBS before discharge would be helpful but for today, he is about to leave for dialysis. Will upgrade to Dys 2, continuing nectar thick liquids by cup.    HPI HPI: Jonathan Budz Sr. is a 69 y.o. male with history of ESRD on hemodialysis, LV thrombus, renal transplant, stroke, hypertension diabetes mellitus was having increasing difficulty swallowing over the last 2 weeks as noticed by patient's wife (pocketing his food. Per chart 6 days ago diagnosed with Covid at the time he was not symptomatic. Now more short of breath and lethargic and confused. 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 Chest x-ray showing multifocal pneumonia.      SLP Plan  Continue with current plan of care       Recommendations  Diet recommendations: Dysphagia 2 (fine chop);Nectar-thick liquid Liquids provided via: Cup;No straw Medication Administration: Crushed with puree Supervision: Staff to assist with self feeding Compensations: Slow rate;Minimize environmental distractions;Small sips/bites;Lingual sweep for clearance of pocketing Postural Changes and/or Swallow Maneuvers: Seated upright 90 degrees                Oral Care  Recommendations: Oral care BID Follow up Recommendations: Skilled Nursing facility SLP Visit Diagnosis: Dysphagia, oropharyngeal phase (R13.12) Plan: Continue with current plan of care       GO                 Osie Bond., M.A. [place] Acute Rehabilitation Services Pager [phone] Office [phone]  08/16/2019, 2:40 PM

## 2019-08-16 NOTE — Progress Notes (Addendum)
[facility] NEPHROLOGY PROGRESS NOTE  Assessment/ Plan: Pt is a 69 y.o. yo male ESRD on HD TTS, HTN, stroke, DM, who with COVID-19 pneumonia.  Dialysis OP: Currently GO TTS2 - previously [facility]  4h 85min  400/800   80kg  2/2 bath R AVF  Hep 8000 venofer 50/week + Mircera 75 last 12/17, parsabiv 7.5, no calcitriol    #Acute COVID-19 pneumonia: s/p remdesivir and Decadron.  As per primary team.  # ESRD - HD per TTS schedule normally - off schedule for HD today 1/8 then back to TTS schedule   # Anemia due to CKD: Continue Aranesp; stable.  # Secondary hyperparathyroidism: Continue binders.  Monitor phosphorus level.  # HTN/volume: acceptable; UF with HD as able  #Acute stroke: Probably embolic phenomena.  On aspirin Lipitor and Coumadin.  OT PT evaluation.    Awaiting SNF placement   Subjective:  Due to the nature of this patient's COVID-19 with isolation and in keeping with efforts to prevent the spread of infection and to conserve personal protective equipment, a review of systems was not personally performed.  Patient's symptoms were discussed in detail with the RN and chart was reviewed.    Spoke with nursing.  He has been on 2 liters oxygen.  Hasn't had HD yet - has been out of window but remains within the isolation unit  Review of systems:  No n/v No Shortness of breath  Right sided weakness since stroke Taking PO - soft   Objective Vital signs in last 24 hours: Vitals:   08/15/19 2023 08/16/19 0511 08/16/19 0800 08/16/19 1200  BP: 138/89 (!) 104/53 (!) 159/60 140/70  Pulse: 89 85 91 93  Resp: 18 18 16 16   Temp: 97.9 F (36.6 C) 98.2 F (36.8 C) 97.6 F (36.4 C) 98.6 F (37 C)  TempSrc: Axillary Axillary Oral Oral  SpO2: 93% 94% 93% 93%  Weight:  72.5 kg    Height:       Weight change: -4 kg  Intake/Output Summary (Last 24 hours) at 08/16/2019 1310 Last data filed at 08/16/2019 [id number] Gross per 24 hour  Intake 240 ml  Output 575 ml  Net -335 ml    Physical exam per nursing General adult male in bed in no acute distress Lungs clear to auscultation bilaterally normal work of breathing at rest; 2 liters oxygen  Heart regular rate and rhythm Extremities no edema; RUE AVF and a right chest tunneled catheter Neuro - alert and oriented to self and follows commands Psych - intermittent agitation; no aggression - pulls covers over head  Labs: Basic Metabolic Panel: Recent Labs  Lab 08/14/19 0832 08/14/19 1053 08/15/19 0929  NA 137 135 138  K 5.2* 4.8 5.1  CL 102 98 98  CO2 23 27 28   GLUCOSE 89 77 79  BUN 72* 51* 58*  CREATININE 9.69* 7.38* 8.91*  CALCIUM 7.7* 7.6* 8.2*  PHOS 3.5 2.9 4.0   Liver Function Tests: Recent Labs  Lab 08/12/19 0630 08/13/19 1808 08/14/19 0832 08/14/19 1053 08/15/19 0929  AST <5* 12* 7*  --   --   ALT 10 10 11   --   --   ALKPHOS 63 55 54  --   --   BILITOT 0.4 0.5 0.6  --   --   PROT 5.6* 5.3* 5.7*  --   --   ALBUMIN 2.1* 2.2* 2.3* 2.4* 2.5*   CBC: Recent Labs  Lab 08/10/19 0500 08/11/19 0332 08/12/19 0630 08/14/19 0833 08/15/19  0929  WBC 9.8 8.9 8.2 6.7 6.0  NEUTROABS  --   --  5.8  --   --   HGB 9.0* 8.2* 8.2* 8.4* 8.8*  HCT 27.9* 25.3* 25.3* 26.4* 27.0*  MCV 105.7* 106.3* 104.1* 105.2* 104.2*  PLT 230 214 206 168 172   Cardiac Enzymes: No results for input(s): CKTOTAL, CKMB, CKMBINDEX, TROPONINI in the last 168 hours. CBG: Recent Labs  Lab 08/15/19 1619 08/15/19 2017 08/16/19 0513 08/16/19 0751 08/16/19 1218  GLUCAP 140* 238* 151* 124* 104*    Iron Studies: No results for input(s): IRON, TIBC, TRANSFERRIN, FERRITIN in the last 72 hours. Studies/Results: No results found.  Medications: Infusions: . sodium chloride    . sodium chloride      Scheduled Medications: .  stroke: mapping our early stages of recovery book   Does not apply Once  . allopurinol  100 mg Oral Daily  . aspirin  81 mg Oral Daily  . atorvastatin  40 mg Oral Daily  . calcium acetate  1,334  mg Oral TID WC  . Chlorhexidine Gluconate Cloth  6 each Topical Q0600  . darbepoetin (ARANESP) injection - DIALYSIS  100 mcg Intravenous Q Thu-HD  . heparin  8,000 Units Dialysis Once in dialysis  . metoCLOPramide  5 mg Oral TID WC  . metoprolol succinate  25 mg Oral Daily  . multivitamin  1 tablet Oral QHS  . pantoprazole  40 mg Oral Daily  . predniSONE  5 mg Oral Q breakfast  . sevelamer carbonate  2.4 g Oral TID WC  . sodium chloride flush  3 mL Intravenous Q12H  . warfarin  2 mg Oral ONCE-1800  . Warfarin - Pharmacist Dosing Inpatient   Does not apply q1800    have reviewed scheduled and prn medications.   Marlane Hatcher Viyaan Champine 08/16/2019,1:10 PM  LOS: 17 days

## 2019-08-16 NOTE — Progress Notes (Signed)
[facility] for Warfarin Indication: h/o LV thrombus and new CVA   No Known Allergies  Patient Measurements: Height: 5\' 11"  (180.3 cm) Weight: 159 lb 13.3 oz (72.5 kg) IBW/kg (Calculated) : 75.3  Vital Signs: Temp: 97.6 F (36.4 C) (01/08 0800) Temp Source: Oral (01/08 0800) BP: 159/60 (01/08 0800) Pulse Rate: 91 (01/08 0800)  Labs: Recent Labs    08/14/19 0832 08/14/19 0833 08/14/19 1053 08/15/19 0345 08/15/19 0929 08/16/19 0406  HGB  --  8.4*  --   --  8.8*  --   HCT  --  26.4*  --   --  27.0*  --   PLT  --  168  --   --  172  --   LABPROT 29.6*  --   --  30.0*  --  30.4*  INR 2.8*  --   --  2.9*  --  2.9*  CREATININE 9.69*  --  7.38*  --  8.91*  --     Estimated Creatinine Clearance: 8.4 mL/min (A) (by C-G formula based on SCr of 8.91 mg/dL (H)).   Assessment: 69 y.o. male admitted with acute respiratory failure, hx LV thrombus on Eliquis PTA]. Last Eliquis dose ~1000 on 12/22. Patient transitioned to heparin on admission with lower goals targeted due to new CVA (MRI confirmed) and risk of hemorrhagic conversion. Heparin infusion discontinued on 12/30 and patient transitioned to warfarin.   Pharmacy consulted to dose warfarin for LV thrombus.   INR is therapeutic at 2.9. No reported bleeding.  Spoke to patient's wife re: warfarin education. Patient has been on warfarin in the past.    Goal of Therapy:  INR goal 2 - 3 Monitor platelets by anticoagulation protocol: Yes   Plan:  Warfarin 2 mg PO tonight  Daily INR Monitor for s/sx of bleeding  Thank you for involving pharmacy in this patient's care.  Renold Genta, PharmD, BCPS Clinical Pharmacist Clinical phone for 08/16/2019 until 3:30p is [phone] 08/16/2019 11:07 AM  **Pharmacist phone directory can be found on [email or web address] listed under [place]**

## 2019-08-16 NOTE — Progress Notes (Signed)
PROGRESS NOTE  Jonathan Klahn Sr. [id number] DOB: Feb 05, 1951 DOA: 07/30/2019 PCP: Marton Redwood, MD  HPI/Recap of past 21 hours: 69 year old male with history of ESRD on hemodialysis, Tuesday Thursday and Saturday history of renal transplant, history of stroke, hypertension, type 2 diabetes mellitus, left ventricular thrombus came to ED with difficulty swallowing.  His wife noticed him having difficulty swallowing for about 2 weeks.  He was tested positive for COVID-19 on December 16.  Chest x-ray showed right upper and right lower lobe, left lower lobe interstitial infiltrate. Patient was noted with diagnosis of acute hypoxic respiratory failure due to SARS COVID-19 viral pneumonia. Further work-up with brain MRI showed small acute infarct in the left pons, left occipital lobe and bilateral frontal lobes.  Initially failed swallow evaluation but fortunately swallowing improved, patient placed on dysphagia 1 diet with aspiration precautions.  Oral anticoagulation was resumed after discussion question with Dr. Leonie Man from neurology.  Out of 21-day window for quarantine.  Will transfer out of COVID-19 unit.   08/16/19: Seen and examined.  No acute events overnight.  Alert and interactive.  He has no new complaints.  Awaiting SNF placement.  Transfer out of Covid unit once bed is available.   Assessment/Plan: Principal Problem:   Acute respiratory failure due to COVID-19 [facility]) Active Problems:   Chronic hepatitis C without hepatic coma ([name])   Diabetes mellitus type 2, uncomplicated ([place])   History of coronary artery disease   Hypertension   ESRD (end stage renal disease) on dialysis ([place])   Acute respiratory failure with hypoxia ([name])   Cerebral thrombosis with cerebral infarction   Cerebral embolism with cerebral infarction    Acute COVID-19 viral pneumonia Positive COVID-19 on 07/24/2019, last day 21-day quarantine 08/14/19. Completed course of remdesivir and Decadron O2 saturation 100%  on room air. Currently on bronchodilators and chronic steroid for his renal transplant.  Acute CVA left pons, left occipital lobe, bilateral frontal lobes. Possibly embolic phenomenon Continue aspirin 81 mg daily, Lipitor 40 mg daily and Coumadin with goal INR between 2 and 3. OT assessment recommended SNF CSW assisting with placement Continue PT OT with assistance and fall precautions.  ESRD on HD TTS Followed by nephrology Volume status addressed with hemodialysis Hemodialysis inpatient and outpatient per nephrology  History of LV thrombus On Coumadin, continue INR is therapeutic 2.9. Coumadin managed by pharmacy  Insulin-dependent type 2 diabetes with hypoglycemia Last A1c 6.3 Hypoglycemia resolved off insulin. CBGs now stable.  History of renal transplant Continue prednisone Continue p.o. Protonix 40 mg daily since on chronic steroids.   DVT prophylaxis: Warfarin  Code Status: Full code  Family Communication:  Updated his wife via phone on 08/16/2019.  Will call again.  Disposition Plan: likely skilled nursing facility when bed becomes available and has a secured HD outpatient spot.     Objective: Vitals:   08/15/19 2023 08/16/19 0511 08/16/19 0800 08/16/19 1200  BP: 138/89 (!) 104/53 (!) 159/60 140/70  Pulse: 89 85 91 93  Resp: 18 18 16 16   Temp: 97.9 F (36.6 C) 98.2 F (36.8 C) 97.6 F (36.4 C) 98.6 F (37 C)  TempSrc: Axillary Axillary Oral Oral  SpO2: 93% 94% 93% 93%  Weight:  72.5 kg    Height:        Intake/Output Summary (Last 24 hours) at 08/16/2019 1249 Last data filed at 08/16/2019 0629 Gross per 24 hour  Intake 240 ml  Output 575 ml  Net -335 ml   [employer]  08/14/19 1050 08/15/19 0539 08/16/19 0511  Weight: 76.5 kg 72.2 kg 72.5 kg    Exam:  . General: 69 y.o. year-old male pleasant well-developed well-nourished in no acute distress.  Alert and interactive. . Cardiovascular: Regular rate and rhythm no rubs or  gallops. Respiratory: Clear to auscultation no wheezes or rales.   Abdomen: Soft nontender normal bowel sounds present. Musculoskeletal: No lower extremity edema bilaterally. Psychiatry: Mood is appropriate for condition and setting.    Data Reviewed: CBC: Recent Labs  Lab 08/10/19 0500 08/11/19 0332 08/12/19 0630 08/14/19 0833 08/15/19 0929  WBC 9.8 8.9 8.2 6.7 6.0  NEUTROABS  --   --  5.8  --   --   HGB 9.0* 8.2* 8.2* 8.4* 8.8*  HCT 27.9* 25.3* 25.3* 26.4* 27.0*  MCV 105.7* 106.3* 104.1* 105.2* 104.2*  PLT 230 214 206 168 Q000111Q   Basic Metabolic Panel: Recent Labs  Lab 08/10/19 0500 08/12/19 0630 08/13/19 1808 08/14/19 0832 08/14/19 1053 08/15/19 0929  NA 139 137 136  136 137 135 138  K 4.8 5.6* 5.7*  5.6* 5.2* 4.8 5.1  CL 97* 95* 98  98 102 98 98  CO2 25 27 26  27 23 27 28   GLUCOSE 157* 121* 206*  205* 89 77 79  BUN 84* 112* 66*  66* 72* 51* 58*  CREATININE 9.40* 12.66* 9.21*  9.17* 9.69* 7.38* 8.91*  CALCIUM 8.1* 8.0* 7.7*  7.7* 7.7* 7.6* 8.2*  PHOS 4.5  --   --  3.5 2.9 4.0   GFR: Estimated Creatinine Clearance: 8.4 mL/min (A) (by C-G formula based on SCr of 8.91 mg/dL (H)). Liver Function Tests: Recent Labs  Lab 08/12/19 0630 08/13/19 1808 08/14/19 0832 08/14/19 1053 08/15/19 0929  AST <5* 12* 7*  --   --   ALT 10 10 11   --   --   ALKPHOS 63 55 54  --   --   BILITOT 0.4 0.5 0.6  --   --   PROT 5.6* 5.3* 5.7*  --   --   ALBUMIN 2.1* 2.2* 2.3* 2.4* 2.5*   No results for input(s): LIPASE, AMYLASE in the last 168 hours. No results for input(s): AMMONIA in the last 168 hours. Coagulation Profile: Recent Labs  Lab 08/12/19 0800 08/13/19 1808 08/14/19 0832 08/15/19 0345 08/16/19 0406  INR 3.0* 2.9* 2.8* 2.9* 2.9*   Cardiac Enzymes: No results for input(s): CKTOTAL, CKMB, CKMBINDEX, TROPONINI in the last 168 hours. BNP (last 3 results) No results for input(s): PROBNP in the last 8760 hours. HbA1C: No results for input(s): HGBA1C in the  last 72 hours. CBG: Recent Labs  Lab 08/15/19 1619 08/15/19 2017 08/16/19 0513 08/16/19 0751 08/16/19 1218  GLUCAP 140* 238* 151* 124* 104*   Lipid Profile: No results for input(s): CHOL, HDL, LDLCALC, TRIG, CHOLHDL, LDLDIRECT in the last 72 hours. Thyroid Function Tests: No results for input(s): TSH, T4TOTAL, FREET4, T3FREE, THYROIDAB in the last 72 hours. Anemia Panel: No results for input(s): VITAMINB12, FOLATE, FERRITIN, TIBC, IRON, RETICCTPCT in the last 72 hours. Urine analysis: No results found for: COLORURINE, APPEARANCEUR, LABSPEC, PHURINE, GLUCOSEU, HGBUR, BILIRUBINUR, KETONESUR, PROTEINUR, UROBILINOGEN, NITRITE, LEUKOCYTESUR Sepsis Labs: @LABRCNTIP (procalcitonin:4,lacticidven:4)  ) No results found for this or any previous visit (from the past 240 hour(s)).    Studies: No results found.  Scheduled Meds: .  stroke: mapping our early stages of recovery book   Does not apply Once  . allopurinol  100 mg Oral Daily  . aspirin  81 mg  Oral Daily  . atorvastatin  40 mg Oral Daily  . calcium acetate  1,334 mg Oral TID WC  . Chlorhexidine Gluconate Cloth  6 each Topical Q0600  . darbepoetin (ARANESP) injection - DIALYSIS  100 mcg Intravenous Q Thu-HD  . heparin  8,000 Units Dialysis Once in dialysis  . metoCLOPramide  5 mg Oral TID WC  . metoprolol succinate  25 mg Oral Daily  . multivitamin  1 tablet Oral QHS  . pantoprazole  40 mg Oral Daily  . predniSONE  5 mg Oral Q breakfast  . sevelamer carbonate  2.4 g Oral TID WC  . sodium chloride flush  3 mL Intravenous Q12H  . warfarin  2 mg Oral ONCE-1800  . Warfarin - Pharmacist Dosing Inpatient   Does not apply q1800    Continuous Infusions: . sodium chloride    . sodium chloride       LOS: 17 days     Kayleen Memos, MD [facility] Pager [phone]  If 7PM-7AM, please contact night-coverage [email or web address] Password [facility] 08/16/2019, 12:49 PM

## 2019-08-17 LAB — RENAL FUNCTION PANEL
Albumin: 2.6 g/dL — ABNORMAL LOW (ref 3.5–5.0)
Anion gap: 10 (ref 5–15)
BUN: 33 mg/dL — ABNORMAL HIGH (ref 8–23)
CO2: 29 mmol/L (ref 22–32)
Calcium: 7.9 mg/dL — ABNORMAL LOW (ref 8.9–10.3)
Chloride: 93 mmol/L — ABNORMAL LOW (ref 98–111)
Creatinine, Ser: 7.1 mg/dL — ABNORMAL HIGH (ref 0.61–1.24)
GFR calc Af Amer: 8 mL/min — ABNORMAL LOW (ref >60)
GFR calc non Af Amer: 7 mL/min — ABNORMAL LOW (ref >60)
Glucose, Bld: 109 mg/dL — ABNORMAL HIGH (ref 70–99)
Phosphorus: 3 mg/dL (ref 2.5–4.6)
Potassium: 4.8 mmol/L (ref 3.5–5.1)
Sodium: 132 mmol/L — ABNORMAL LOW (ref 135–145)

## 2019-08-17 LAB — CBC
HCT: 27.5 % — ABNORMAL LOW (ref 39.0–52.0)
Hemoglobin: 8.7 g/dL — ABNORMAL LOW (ref 13.0–17.0)
MCH: 33 pg (ref 26.0–34.0)
MCHC: 31.6 g/dL (ref 30.0–36.0)
MCV: 104.2 fL — ABNORMAL HIGH (ref 80.0–100.0)
Platelets: 131 10*3/uL — ABNORMAL LOW (ref 150–400)
RBC: 2.64 MIL/uL — ABNORMAL LOW (ref 4.22–5.81)
RDW: 15.5 % (ref 11.5–15.5)
WBC: 7.5 10*3/uL (ref 4.0–10.5)
nRBC: 0 % (ref 0.0–0.2)

## 2019-08-17 LAB — PROTIME-INR
INR: 2.5 — ABNORMAL HIGH (ref 0.8–1.2)
Prothrombin Time: 26.5 seconds — ABNORMAL HIGH (ref 11.4–15.2)

## 2019-08-17 LAB — GLUCOSE, CAPILLARY
Glucose-Capillary: 114 mg/dL — ABNORMAL HIGH (ref 70–99)
Glucose-Capillary: 116 mg/dL — ABNORMAL HIGH (ref 70–99)
Glucose-Capillary: 119 mg/dL — ABNORMAL HIGH (ref 70–99)
Glucose-Capillary: 188 mg/dL — ABNORMAL HIGH (ref 70–99)
Glucose-Capillary: 89 mg/dL (ref 70–99)
Glucose-Capillary: 99 mg/dL (ref 70–99)

## 2019-08-17 MED ORDER — WARFARIN SODIUM 2 MG PO TABS
2.0000 mg | ORAL_TABLET | Freq: Once | ORAL | Status: AC
  Administered 2019-08-17: 19:00:00 2 mg via ORAL
  Filled 2019-08-17: qty 1

## 2019-08-17 MED ORDER — CHLORHEXIDINE GLUCONATE CLOTH 2 % EX PADS
6.0000 | MEDICATED_PAD | Freq: Every day | CUTANEOUS | Status: DC
  Administered 2019-08-17 – 2019-08-18 (×2): 6 via TOPICAL

## 2019-08-17 NOTE — Progress Notes (Signed)
Pt transferred off unit via nursing staff. Alert/ orientation x1-2 at baseline. No s/s distress noted. Isolation precautions have been d/c'd. Telemetry orders d/c'd. No PIV in place. Right chest HD port in place. On room air.

## 2019-08-17 NOTE — Progress Notes (Signed)
PROGRESS NOTE  Jonathan Tessmer Sr. [id number] DOB: 09-Feb-1951 DOA: 07/30/2019 PCP: Marton Redwood, MD  HPI/Recap of past 60 hours: 69 year old male with history of ESRD on hemodialysis, Tuesday Thursday and Saturday, history of renal transplant, history of stroke, hypertension, type 2 diabetes mellitus, left ventricular thrombus on Eliquis came to [facility] ED from home with difficulty swallowing for about 2 weeks.  He was tested positive for COVID-19 on July 24, 2019.  Chest x-ray showed bilateral interstitial infiltrates.  Further work-up with brain MRI showed small acute infarct in the left pons, left occipital lobe and bilateral frontal lobes.  Initially failed swallow evaluation then improved.  Now on dysphagia 2 diet and nectar thick liquid.  Oral anticoagulation switched to coumadin since failed on Eliquis.  Will need to follow up with neurology post hospitalization.  Was seen by Dr. Leonie Man from neurology.  PT recommended SNF. CSW assisting with placement.  Currently out of 21-day window for quarantine.  Will transfer out of COVID-19 unit.  08/17/19: Seen and examined.  No new complaints.  Had HD yesterday.  Per nephrology note, will be back to his normal HD schedule TTS today.    Assessment/Plan: Principal Problem:   Acute respiratory failure due to COVID-19 [facility]) Active Problems:   Chronic hepatitis C without hepatic coma ([name])   Diabetes mellitus type 2, uncomplicated ([place])   History of coronary artery disease   Hypertension   ESRD (end stage renal disease) on dialysis ([place])   Acute respiratory failure with hypoxia ([name])   Cerebral thrombosis with cerebral infarction   Cerebral embolism with cerebral infarction    Acute COVID-19 viral pneumonia Positive COVID-19 on 07/24/2019, last day 21-day quarantine 08/14/19. Completed course of remdesivir and Decadron O2 saturation 100% on room air. Currently on bronchodilators and chronic steroid for his renal transplant.  Acute CVA left  pons, left occipital lobe, bilateral frontal lobes. Possibly embolic phenomenon Continue aspirin 81 mg daily, Lipitor 40 mg daily and Coumadin with goal INR between 2 and 3. OT assessment recommended SNF CSW assisting with placement Continue PT OT with assistance and fall precautions.  ESRD on HD TTS Followed by nephrology Volume status addressed with hemodialysis Hemodialysis inpatient and outpatient per nephrology Resume normal schedule today per nephrology  History of LV thrombus On Coumadin, continue INR is therapeutic 2.9. Coumadin managed by pharmacy  Insulin-dependent type 2 diabetes with hypoglycemia Last A1c 6.3 Hypoglycemia resolved off insulin. CBGs now stable.  History of renal transplant Continue prednisone Continue p.o. Protonix 40 mg daily since on chronic steroids.   DVT prophylaxis: Warfarin  Code Status: Full code  Family Communication:  Updated his wife via phone on 08/16/2019.  Will call again.  Disposition Plan: likely skilled nursing facility when bed becomes available and has a secured HD outpatient spot.     Objective: Vitals:   08/17/19 0813 08/17/19 0900 08/17/19 1000 08/17/19 1100  BP: 125/73     Pulse: 100     Resp:  20 11 (!) 21  Temp:      TempSrc:      SpO2:      Weight:      Height:        Intake/Output Summary (Last 24 hours) at 08/17/2019 1200 Last data filed at 08/17/2019 0830 Gross per 24 hour  Intake 170 ml  Output 82 ml  Net 88 ml   Filed Weights   08/16/19 1420 08/16/19 1809 08/17/19 0426  Weight: 73.7 kg 73.7 kg 74 kg  Exam: No changes in PE from 08/16/19.  Marland Kitchen General: 69 y.o. year-old male pleasant well-developed well-nourished in no acute distress.  Alert and interactive. . Cardiovascular: Regular rate and rhythm no rubs or gallops. Respiratory: Clear to auscultation no wheezes or rales.   Abdomen: Soft nontender normal bowel sounds present. Musculoskeletal: No lower extremity edema bilaterally. Psychiatry:  Mood is appropriate for condition and setting.    Data Reviewed: CBC: Recent Labs  Lab 08/11/19 0332 08/12/19 0630 08/14/19 0833 08/15/19 0929  WBC 8.9 8.2 6.7 6.0  NEUTROABS  --  5.8  --   --   HGB 8.2* 8.2* 8.4* 8.8*  HCT 25.3* 25.3* 26.4* 27.0*  MCV 106.3* 104.1* 105.2* 104.2*  PLT 214 206 168 Q000111Q   Basic Metabolic Panel: Recent Labs  Lab 08/13/19 1808 08/14/19 0832 08/14/19 1053 08/15/19 0929 08/16/19 1500  NA 136  136 137 135 138 134*  K 5.7*  5.6* 5.2* 4.8 5.1 4.0  CL 98  98 102 98 98 97*  CO2 26  27 23 27 28 27   GLUCOSE 206*  205* 89 77 79 124*  BUN 66*  66* 72* 51* 58* 48*  CREATININE 9.21*  9.17* 9.69* 7.38* 8.91* 7.67*  CALCIUM 7.7*  7.7* 7.7* 7.6* 8.2* 7.8*  PHOS  --  3.5 2.9 4.0 2.3*   GFR: Estimated Creatinine Clearance: 9.9 mL/min (A) (by C-G formula based on SCr of 7.67 mg/dL (H)). Liver Function Tests: Recent Labs  Lab 08/12/19 0630 08/13/19 1808 08/14/19 0832 08/14/19 1053 08/15/19 0929 08/16/19 1500  AST <5* 12* 7*  --   --   --   ALT 10 10 11   --   --   --   ALKPHOS 63 55 54  --   --   --   BILITOT 0.4 0.5 0.6  --   --   --   PROT 5.6* 5.3* 5.7*  --   --   --   ALBUMIN 2.1* 2.2* 2.3* 2.4* 2.5* 2.6*   No results for input(s): LIPASE, AMYLASE in the last 168 hours. No results for input(s): AMMONIA in the last 168 hours. Coagulation Profile: Recent Labs  Lab 08/12/19 0800 08/13/19 1808 08/14/19 0832 08/15/19 0345 08/16/19 0406  INR 3.0* 2.9* 2.8* 2.9* 2.9*   Cardiac Enzymes: No results for input(s): CKTOTAL, CKMB, CKMBINDEX, TROPONINI in the last 168 hours. BNP (last 3 results) No results for input(s): PROBNP in the last 8760 hours. HbA1C: No results for input(s): HGBA1C in the last 72 hours. CBG: Recent Labs  Lab 08/16/19 1827 08/16/19 2052 08/17/19 0027 08/17/19 0435 08/17/19 0808  GLUCAP 89 113* 116* 99 89   Lipid Profile: No results for input(s): CHOL, HDL, LDLCALC, TRIG, CHOLHDL, LDLDIRECT in the last 72  hours. Thyroid Function Tests: No results for input(s): TSH, T4TOTAL, FREET4, T3FREE, THYROIDAB in the last 72 hours. Anemia Panel: No results for input(s): VITAMINB12, FOLATE, FERRITIN, TIBC, IRON, RETICCTPCT in the last 72 hours. Urine analysis: No results found for: COLORURINE, APPEARANCEUR, LABSPEC, PHURINE, GLUCOSEU, HGBUR, BILIRUBINUR, KETONESUR, PROTEINUR, UROBILINOGEN, NITRITE, LEUKOCYTESUR Sepsis Labs: @LABRCNTIP (procalcitonin:4,lacticidven:4)  ) No results found for this or any previous visit (from the past 240 hour(s)).    Studies: No results found.  Scheduled Meds: .  stroke: mapping our early stages of recovery book   Does not apply Once  . allopurinol  100 mg Oral Daily  . aspirin  81 mg Oral Daily  . atorvastatin  40 mg Oral Daily  . calcium acetate  1,334  mg Oral TID WC  . Chlorhexidine Gluconate Cloth  6 each Topical Q0600  . Chlorhexidine Gluconate Cloth  6 each Topical Q0600  . darbepoetin (ARANESP) injection - DIALYSIS  100 mcg Intravenous Q Sat-HD  . heparin  8,000 Units Dialysis Once in dialysis  . metoCLOPramide  5 mg Oral TID WC  . metoprolol succinate  25 mg Oral Daily  . multivitamin  1 tablet Oral QHS  . pantoprazole  40 mg Oral Daily  . predniSONE  5 mg Oral Q breakfast  . sevelamer carbonate  2.4 g Oral TID WC  . sodium chloride flush  3 mL Intravenous Q12H  . Warfarin - Pharmacist Dosing Inpatient   Does not apply q1800    Continuous Infusions: . sodium chloride    . sodium chloride       LOS: 18 days     Kayleen Memos, MD [facility] Pager [phone]  If 7PM-7AM, please contact night-coverage [email or web address] Password [email or web address] 08/17/2019, 12:00 PM

## 2019-08-17 NOTE — Progress Notes (Signed)
[facility] for Warfarin Indication: h/o LV thrombus and new CVA   No Known Allergies  Patient Measurements: Height: 5\' 11"  (180.3 cm) Weight: 163 lb 2.3 oz (74 kg) IBW/kg (Calculated) : 75.3  Vital Signs: Temp: 97.6 F (36.4 C) (01/09 1200) Temp Source: Oral (01/09 1200) BP: 147/79 (01/09 1250) Pulse Rate: 100 (01/09 0813)  Labs: Recent Labs    08/15/19 0345 08/15/19 0929 08/16/19 0406 08/16/19 1500 08/17/19 1059 08/17/19 1434  HGB  --  8.8*  --   --   --  8.7*  HCT  --  27.0*  --   --   --  27.5*  PLT  --  172  --   --   --  131*  LABPROT 30.0*  --  30.4*  --   --  26.5*  INR 2.9*  --  2.9*  --   --  2.5*  CREATININE  --  8.91*  --  7.67* 7.10*  --     Estimated Creatinine Clearance: 10.7 mL/min (A) (by C-G formula based on SCr of 7.1 mg/dL (H)).   Assessment: 69 y.o. male admitted with acute respiratory failure, hx LV thrombus on Eliquis PTA]. Last Eliquis dose ~1000 on 12/22. Patient transitioned to heparin on admission with lower goals targeted due to new CVA (MRI confirmed) and risk of hemorrhagic conversion. Heparin infusion discontinued on 12/30 and patient transitioned to warfarin.   Pharmacy consulted to dose warfarin for LV thrombus.   INR is therapeutic at 2.5. No reported bleeding.  Spoke to patient's wife re: warfarin education. Patient has been on warfarin in the past.    Goal of Therapy:  INR goal 2 - 3 Monitor platelets by anticoagulation protocol: Yes   Plan:  Warfarin 2 mg PO tonight  Daily INR Monitor for s/sx of bleeding  Thank you for involving pharmacy in this patient's care.  Marguerite Olea, [facility] Clinical Pharmacist Phone [phone]  08/17/2019 5:19 PM

## 2019-08-17 NOTE — Progress Notes (Signed)
Attempt to get INR x2 by RN, x1 by phlebotomy. Lab updated. Will notify phlebotomy to re-attempt.

## 2019-08-17 NOTE — Plan of Care (Signed)
  Problem: Education: Goal: Knowledge of risk factors and measures for prevention of condition will improve Outcome: Progressing   Problem: Coping: Goal: Psychosocial and spiritual needs will be supported Outcome: Progressing   Problem: Education: Goal: Knowledge of disease or condition will improve Outcome: Progressing Goal: Knowledge of secondary prevention will improve Outcome: Progressing Goal: Knowledge of patient specific risk factors addressed and post discharge goals established will improve Outcome: Progressing   Problem: Health Behavior/Discharge Planning: Goal: Ability to manage health-related needs will improve Outcome: Progressing   Problem: Self-Care: Goal: Ability to participate in self-care as condition permits will improve Outcome: Progressing Goal: Verbalization of feelings and concerns over difficulty with self-care will improve Outcome: Progressing Goal: Ability to communicate needs accurately will improve Outcome: Progressing   Problem: Nutrition: Goal: Risk of aspiration will decrease Outcome: Progressing Goal: Dietary intake will improve Outcome: Progressing   Problem: Ischemic Stroke/TIA Tissue Perfusion: Goal: Complications of ischemic stroke/TIA will be minimized Outcome: Progressing   Problem: Education: Goal: Knowledge of General Education information will improve Description: Including pain rating scale, medication(s)/side effects and non-pharmacologic comfort measures Outcome: Progressing   Problem: Health Behavior/Discharge Planning: Goal: Ability to manage health-related needs will improve Outcome: Progressing   Problem: Clinical Measurements: Goal: Ability to maintain clinical measurements within normal limits will improve Outcome: Progressing Goal: Diagnostic test results will improve Outcome: Progressing Goal: Respiratory complications will improve Outcome: Progressing Goal: Cardiovascular complication will be avoided Outcome:  Progressing   Problem: Activity: Goal: Risk for activity intolerance will decrease Outcome: Progressing   Problem: Nutrition: Goal: Adequate nutrition will be maintained Outcome: Progressing   Problem: Elimination: Goal: Will not experience complications related to bowel motility Outcome: Progressing Goal: Will not experience complications related to urinary retention Outcome: Progressing   Problem: Safety: Goal: Ability to remain free from injury will improve Outcome: Progressing

## 2019-08-17 NOTE — Progress Notes (Signed)
[facility] NEPHROLOGY PROGRESS NOTE  Assessment/ Plan: Pt is a 69 y.o. yo male ESRD on HD TTS, HTN, stroke, DM, who with COVID-19 pneumonia.  Dialysis OP: Currently GO TTS2 - previously [facility]  4h 20min  400/800   80kg  2/2 bath R AVF  Hep 8000 venofer 50/week + Mircera 75 last 12/17, parsabiv 7.5, no calcitriol    #Acute COVID-19 pneumonia: s/p remdesivir and Decadron.  As per primary team.  # ESRD - HD per TTS schedule recently.  Last HD off schedule 1/8 due to inpatient volumes.  Plan for HD on 1/10.  Previously at [facility] and will go back there unless different unit needed for SNF. (no longer need the [facility] outpatient unit as out of the 21-day window - 12/16 was positive test). He was previously MWF and can resume MWF schedule at [facility] on [facility] when felt stable for discharge per primary team.   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  # Secondary hyperparathyroidism: Continue binders.  acceptable phosphorus level. Not on calcitriol ; parsabiv not available here  # HTN/volume: acceptable; UF with HD as able  #Acute stroke: Probably embolic phenomena.  On aspirin Lipitor and Coumadin.  OT PT evaluation.    Awaiting SNF placement.  He was previously MWF schedule for HD and can resume MWF schedule at [facility] on [facility] when felt stable for discharge per primary team.    Subjective:  Examined in person with appropriate PPE.  He remains on the Covid unit but is being transferred out when a bed is available they state.  Per charting he is out of the 21-day window for isolation  Review of systems:  Denies n/v Taking po No shortness of breath or chest pain   Objective Vital signs in last 24 hours: Vitals:   08/17/19 0813 08/17/19 0900 08/17/19 1000 08/17/19 1100  BP: 125/73     Pulse: 100     Resp:  20 11 (!) 21  Temp:      TempSrc:      SpO2:      Weight:      Height:       Weight change:  1.2 kg  Intake/Output Summary (Last 24 hours) at 08/17/2019 1503 Last data filed at 08/17/2019 0830 Gross per 24 hour  Intake 170 ml  Output 82 ml  Net 88 ml   Physical exam General adult male in bed in no acute distress HEENT NCAT EOMI Neck supple trachea midline Lungs clear to auscultation bilaterally normal work of breathing at rest;room air Heart regular rate and rhythm no rub abd soft/NT/ND Extremities no edema; RUE AVF bruit and thrill and a right chest tunneled catheter Neuro - awake and interactive, answers questions   Labs: Basic Metabolic Panel: Recent Labs  Lab 08/15/19 0929 08/16/19 1500 08/17/19 1059  NA 138 134* 132*  K 5.1 4.0 4.8  CL 98 97* 93*  CO2 28 27 29   GLUCOSE 79 124* 109*  BUN 58* 48* 33*  CREATININE 8.91* 7.67* 7.10*  CALCIUM 8.2* 7.8* 7.9*  PHOS 4.0 2.3* 3.0   Liver Function Tests: Recent Labs  Lab 08/12/19 0630 08/13/19 1808 08/14/19 0832 08/15/19 0929 08/16/19 1500 08/17/19 1059  AST <5* 12* 7*  --   --   --   ALT 10 10 11   --   --   --   ALKPHOS 63 55 54  --   --   --  BILITOT 0.4 0.5 0.6  --   --   --   PROT 5.6* 5.3* 5.7*  --   --   --   ALBUMIN 2.1* 2.2* 2.3* 2.5* 2.6* 2.6*   CBC: Recent Labs  Lab 08/11/19 0332 08/12/19 0630 08/14/19 0833 08/15/19 0929 08/17/19 1434  WBC 8.9 8.2 6.7 6.0 7.5  NEUTROABS  --  5.8  --   --   --   HGB 8.2* 8.2* 8.4* 8.8* 8.7*  HCT 25.3* 25.3* 26.4* 27.0* 27.5*  MCV 106.3* 104.1* 105.2* 104.2* 104.2*  PLT 214 206 168 172 131*   Cardiac Enzymes: No results for input(s): CKTOTAL, CKMB, CKMBINDEX, TROPONINI in the last 168 hours. CBG: Recent Labs  Lab 08/16/19 2052 08/17/19 0027 08/17/19 0435 08/17/19 0808 08/17/19 1157  GLUCAP 113* 116* 99 89 119*    Iron Studies: No results for input(s): IRON, TIBC, TRANSFERRIN, FERRITIN in the last 72 hours. Studies/Results: No results found.  Medications: Infusions: . sodium chloride    . sodium chloride      Scheduled Medications: .   stroke: mapping our early stages of recovery book   Does not apply Once  . allopurinol  100 mg Oral Daily  . aspirin  81 mg Oral Daily  . atorvastatin  40 mg Oral Daily  . calcium acetate  1,334 mg Oral TID WC  . Chlorhexidine Gluconate Cloth  6 each Topical Q0600  . Chlorhexidine Gluconate Cloth  6 each Topical Q0600  . darbepoetin (ARANESP) injection - DIALYSIS  100 mcg Intravenous Q Sat-HD  . heparin  8,000 Units Dialysis Once in dialysis  . metoCLOPramide  5 mg Oral TID WC  . metoprolol succinate  25 mg Oral Daily  . multivitamin  1 tablet Oral QHS  . pantoprazole  40 mg Oral Daily  . predniSONE  5 mg Oral Q breakfast  . sevelamer carbonate  2.4 g Oral TID WC  . sodium chloride flush  3 mL Intravenous Q12H  . Warfarin - Pharmacist Dosing Inpatient   Does not apply q1800    have reviewed scheduled and prn medications.   Claudia Desanctis 08/17/2019,3:03 PM  LOS: 18 days

## 2019-08-17 NOTE — TOC Progression Note (Addendum)
Transition of Care (TOC) - Progression Note    Patient Details  Name: [name]. MRN: [id number] Date of Birth: [birth year]  Transition of Care [facility]) CM/SW [place], [place] Phone Number: [phone] 08/17/2019, 12:10 PM  Clinical Narrative:     Update: Janett Billow with [facility] confirms patient has no out of network insurance benefits.   CSW following up on SNF referrals, CSW spoke with Tammy who reports [facility] have no beds. CSW spoke with Janett Billow at [facility] [phone]) reports not in network with insurance however she is checking to see if patient has out of network benefits. CSW spoke with Audelia Acton [phone] who gave CSW number for Aram Beecham with [facility] at [phone] she reports she will review referral and requested all clinicals be faxed to [phone]. CSW has faxed clinicals, pending response. Potential barrier in addition to covid +status is patient must receive dialysis.     Expected Discharge Plan: [place]    Expected Discharge Plan and Services Expected Discharge Plan: [place] arrangements for the past 2 months: Single Family Home                                       Social Determinants of Health (SDOH) Interventions    Readmission Risk Interventions No flowsheet data found.

## 2019-08-17 NOTE — Progress Notes (Signed)
Informed Rn, Tonita Cong HD [place] moved to 08/18/19 per Dr. Royce Macadamia.

## 2019-08-18 LAB — GLUCOSE, CAPILLARY
Glucose-Capillary: 116 mg/dL — ABNORMAL HIGH (ref 70–99)
Glucose-Capillary: 169 mg/dL — ABNORMAL HIGH (ref 70–99)
Glucose-Capillary: 183 mg/dL — ABNORMAL HIGH (ref 70–99)
Glucose-Capillary: 195 mg/dL — ABNORMAL HIGH (ref 70–99)
Glucose-Capillary: 204 mg/dL — ABNORMAL HIGH (ref 70–99)

## 2019-08-18 LAB — CBC
HCT: 28.6 % — ABNORMAL LOW (ref 39.0–52.0)
Hemoglobin: 9.2 g/dL — ABNORMAL LOW (ref 13.0–17.0)
MCH: 33.3 pg (ref 26.0–34.0)
MCHC: 32.2 g/dL (ref 30.0–36.0)
MCV: 103.6 fL — ABNORMAL HIGH (ref 80.0–100.0)
Platelets: 144 10*3/uL — ABNORMAL LOW (ref 150–400)
RBC: 2.76 MIL/uL — ABNORMAL LOW (ref 4.22–5.81)
RDW: 15.5 % (ref 11.5–15.5)
WBC: 9.1 10*3/uL (ref 4.0–10.5)
nRBC: 0 % (ref 0.0–0.2)

## 2019-08-18 LAB — RENAL FUNCTION PANEL
Albumin: 2.7 g/dL — ABNORMAL LOW (ref 3.5–5.0)
Anion gap: 15 (ref 5–15)
BUN: 42 mg/dL — ABNORMAL HIGH (ref 8–23)
CO2: 22 mmol/L (ref 22–32)
Calcium: 8.5 mg/dL — ABNORMAL LOW (ref 8.9–10.3)
Chloride: 95 mmol/L — ABNORMAL LOW (ref 98–111)
Creatinine, Ser: 8.25 mg/dL — ABNORMAL HIGH (ref 0.61–1.24)
GFR calc Af Amer: 7 mL/min — ABNORMAL LOW (ref >60)
GFR calc non Af Amer: 6 mL/min — ABNORMAL LOW (ref >60)
Glucose, Bld: 182 mg/dL — ABNORMAL HIGH (ref 70–99)
Phosphorus: 2.7 mg/dL (ref 2.5–4.6)
Potassium: 5.5 mmol/L — ABNORMAL HIGH (ref 3.5–5.1)
Sodium: 132 mmol/L — ABNORMAL LOW (ref 135–145)

## 2019-08-18 LAB — PROTIME-INR
INR: 2.4 — ABNORMAL HIGH (ref 0.8–1.2)
Prothrombin Time: 26 seconds — ABNORMAL HIGH (ref 11.4–15.2)

## 2019-08-18 MED ORDER — WARFARIN SODIUM 2 MG PO TABS
2.0000 mg | ORAL_TABLET | Freq: Once | ORAL | Status: AC
  Administered 2019-08-18: 2 mg via ORAL
  Filled 2019-08-18: qty 1

## 2019-08-18 MED ORDER — CHLORHEXIDINE GLUCONATE CLOTH 2 % EX PADS
6.0000 | MEDICATED_PAD | Freq: Every day | CUTANEOUS | Status: DC
  Administered 2019-08-19 – 2019-08-28 (×10): 6 via TOPICAL

## 2019-08-18 NOTE — Progress Notes (Signed)
[place] for Warfarin Indication: h/o LV thrombus and new CVA   No Known Allergies  Patient Measurements: Height: 5\' 11"  (180.3 cm) Weight: 152 lb 1.9 oz (69 kg) IBW/kg (Calculated) : 75.3  Vital Signs: Temp: 98.3 F (36.8 C) (01/10 1058) Temp Source: Oral (01/10 1058) BP: 113/66 (01/10 1058) Pulse Rate: 94 (01/10 1058)  Labs: Recent Labs    08/16/19 0406 08/16/19 1500 08/17/19 1059 08/17/19 1434 08/18/19 0206  HGB  --   --   --  8.7* 9.2*  HCT  --   --   --  27.5* 28.6*  PLT  --   --   --  131* 144*  LABPROT 30.4*  --   --  26.5* 26.0*  INR 2.9*  --   --  2.5* 2.4*  CREATININE  --  7.67* 7.10*  --  8.25*    Estimated Creatinine Clearance: 8.6 mL/min (A) (by C-G formula based on SCr of 8.25 mg/dL (H)).   Assessment: 69 y.o. male admitted with acute respiratory failure, hx LV thrombus on Eliquis PTA]. Last Eliquis dose ~1000 on 12/22. Patient transitioned to heparin on admission with lower goals targeted due to new CVA (MRI confirmed) and risk of hemorrhagic conversion. Heparin infusion discontinued on 12/30 and patient transitioned to warfarin.   Pharmacy consulted to dose warfarin for LV thrombus.   INR is therapeutic at 2.4. Hgb trending up 9.2. Plt slightly low at 144. No reported bleeding.  Previous pharmacist spoke to patient's wife re: warfarin education. Patient has been on warfarin in the past.    Goal of Therapy:  INR goal 2 - 3 Monitor platelets by anticoagulation protocol: Yes   Plan:  Warfarin 2 mg PO x1 tonight  Daily INR Monitor for s/sx of bleeding  Thank you for involving pharmacy in this patient's care.  Agnes Lawrence, PharmD PGY1 Pharmacy Resident

## 2019-08-18 NOTE — Progress Notes (Addendum)
[facility] NEPHROLOGY PROGRESS NOTE  Assessment/ Plan: Pt is a 69 y.o. yo male ESRD on HD TTS, HTN, stroke, DM, who was admitted with COVID-19 pneumonia.  Dialysis OP: Currently GO TTS2 - previously [facility]  4h 77min  400/800   80kg  2/2 bath R AVF  Hep 8000 venofer 50/week + Mircera 75 last 12/17, parsabiv 7.5, no calcitriol    #Acute COVID-19 pneumonia: s/p remdesivir and Decadron.  As per primary team.  Now out of 21 day isolation window  # ESRD - HD per TTS schedule recently.  Last HD off schedule 1/8 due to inpatient volumes.  Plan for HD on 1/10 off schedule.   - Transition back to MWF schedule for HD - Addendum: currently still assigned to the [facility] outpatient unit TTS as per [facility] policy for two negative tests before returning there.  Note he is out of the [facility] 21-day window for isolation given 12/16 was his positive test).    - Appears that his AVF has been used this tx and last - catheter the treatment prior.  Follow as may be able to remove soon?  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  # Secondary hyperparathyroidism: Continue binders.  acceptable phosphorus level.  Not on calcitriol; parsabiv not available here  # HTN/volume: acceptable; UF with HD as able  #Acute stroke: Probably embolic phenomena.  On aspirin Lipitor and Coumadin.  OT PT evaluation.    Awaiting SNF placement.  Disposition per primary team.    Subjective:   Seen and examined on dialysis.  Procedure supervised.  Blood pressure 119/67 and HR 88.   Tolerating goal.  He has been using AVF today with BF 300 and also used AVF last treatment as well.  Review of systems:    Denies n/v Taking po No shortness of breath or chest pain   Objective Vital signs in last 24 hours: Vitals:   08/17/19 2237 08/18/19 0439 08/18/19 0820 08/18/19 0828  BP: (!) 151/69 (!) 144/72 117/68 (!) 148/78  Pulse: 97 97 91 90  Resp: 20 20 18    Temp: 98.8 F (37.1 C) 98.2 F (36.8  C) 98.3 F (36.8 C)   TempSrc: Oral Oral Oral   SpO2: 96% 97% 94%   Weight: 71.7 kg  70.3 kg   Height:       Weight change: -2 kg  Intake/Output Summary (Last 24 hours) at 08/18/2019 0859 Last data filed at 08/18/2019 0440 Gross per 24 hour  Intake 100 ml  Output 250 ml  Net -150 ml   Physical exam   General adult male in bed in no acute distress - sheet over head initially HEENT NCAT EOMI Neck supple trachea midline Lungs clear to auscultation bilaterally normal work of breathing at rest;on room air Heart S1S2 mild tachy no rub abd soft/NT/ND Extremities no edema; RUE AVF bruit and thrill and a right chest tunneled catheter Neuro - awake and interactive, answers questions   Labs: Basic Metabolic Panel: Recent Labs  Lab 08/16/19 1500 08/17/19 1059 08/18/19 0206  NA 134* 132* 132*  K 4.0 4.8 5.5*  CL 97* 93* 95*  CO2 27 29 22   GLUCOSE 124* 109* 182*  BUN 48* 33* 42*  CREATININE 7.67* 7.10* 8.25*  CALCIUM 7.8* 7.9* 8.5*  PHOS 2.3* 3.0 2.7   Liver Function Tests: Recent Labs  Lab 08/12/19 0630 08/13/19 1808 08/14/19 0832 08/16/19 1500 08/17/19 1059 08/18/19 0206  AST <5* 12* 7*  --   --   --  ALT 10 10 11   --   --   --   ALKPHOS 63 55 54  --   --   --   BILITOT 0.4 0.5 0.6  --   --   --   PROT 5.6* 5.3* 5.7*  --   --   --   ALBUMIN 2.1* 2.2* 2.3* 2.6* 2.6* 2.7*   CBC: Recent Labs  Lab 08/12/19 0630 08/14/19 0833 08/15/19 0929 08/17/19 1434 08/18/19 0206  WBC 8.2 6.7 6.0 7.5 9.1  NEUTROABS 5.8  --   --   --   --   HGB 8.2* 8.4* 8.8* 8.7* 9.2*  HCT 25.3* 26.4* 27.0* 27.5* 28.6*  MCV 104.1* 105.2* 104.2* 104.2* 103.6*  PLT 206 168 172 131* 144*   CBG: Recent Labs  Lab 08/17/19 1723 08/17/19 2031 08/18/19 0019 08/18/19 0345 08/18/19 0737  GLUCAP 114* 188* 195* 204* 116*    Medications: Infusions: . sodium chloride    . sodium chloride      Scheduled Medications: .  stroke: mapping our early stages of recovery book   Does not apply  Once  . allopurinol  100 mg Oral Daily  . aspirin  81 mg Oral Daily  . atorvastatin  40 mg Oral Daily  . calcium acetate  1,334 mg Oral TID WC  . Chlorhexidine Gluconate Cloth  6 each Topical Q0600  . Chlorhexidine Gluconate Cloth  6 each Topical Q0600  . darbepoetin (ARANESP) injection - DIALYSIS  100 mcg Intravenous Q Sat-HD  . heparin  8,000 Units Dialysis Once in dialysis  . metoCLOPramide  5 mg Oral TID WC  . metoprolol succinate  25 mg Oral Daily  . multivitamin  1 tablet Oral QHS  . pantoprazole  40 mg Oral Daily  . predniSONE  5 mg Oral Q breakfast  . sevelamer carbonate  2.4 g Oral TID WC  . sodium chloride flush  3 mL Intravenous Q12H  . Warfarin - Pharmacist Dosing Inpatient   Does not apply q1800    have reviewed scheduled and prn medications.   Lidie Glade C Antolin Belsito 08/18/2019,8:59 AM  LOS: 19 days

## 2019-08-18 NOTE — Plan of Care (Signed)
  Problem: Education: Goal: Knowledge of disease or condition will improve Outcome: Progressing   

## 2019-08-18 NOTE — Progress Notes (Signed)
Patient received from 5W via bed. Assessment as charted. Clothes and shoes brought with patient. Patient with dried BM on bottom and hands. Bathed,full linen change. A/O to self only. Thinks he is in [facility]. No c/o pain.

## 2019-08-18 NOTE — Progress Notes (Signed)
PROGRESS NOTE  Jonathan Rumph Sr. 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  Patient was admitted with 2-week history of difficulty swallowing (dysphagia).  Apparently, patient tested positive for COVID-19 on July 24, 2019.  Chest x-ray showed bilateral interstitial infiltrates.  Further work-up with brain MRI showed small acute infarct in the left pons, left occipital lobe and bilateral frontal lobes.  Initially failed swallow evaluation then improved.  Now on dysphagia 2 diet and nectar thick liquid.  Oral anticoagulation switched to coumadin since failed on Eliquis.  Will need to follow up with neurology post hospitalization.  Was seen by Dr. Leonie Man from neurology.  PT recommended SNF. CSW assisting with placement.  Currently out of 21-day window for quarantine.    08/18/19:Patient was seen on hemodialysis.  Also discussed with nephrology PA/nurse practitioner.  Brief episode of syncope was noted, and patient was volume resuscitated.  Ultrafiltration is currently on hold.  Patient is back to his baseline.  Patient has no new complaints.  Pursue disposition.   Assessment/Plan: Principal Problem:   Acute respiratory failure due to COVID-19 [facility]) Active Problems:   Chronic hepatitis C without hepatic coma ([name])   Diabetes mellitus type 2, uncomplicated ([place])   History of coronary artery disease   Hypertension   ESRD (end stage renal disease) on dialysis ([place])   Acute respiratory failure with hypoxia ([name])   Cerebral thrombosis with cerebral infarction   Cerebral embolism with cerebral infarction    Acute COVID-19 viral pneumonia Positive COVID-19 on 07/24/2019, last day  21-day quarantine 08/14/19. Completed course of remdesivir and Decadron O2 saturation 100% on room air. Currently on bronchodilators and chronic steroid for his renal transplant.  Acute CVA left pons, left occipital lobe, bilateral frontal lobes. Possibly embolic phenomenon Patient is currently on aspirin 81 mg daily, Lipitor 40 mg daily and Coumadin with goal INR between 2 and 3. -OT assessment recommended SNF CSW assisting with placement Continue PT OT with assistance and fall precautions.  ESRD on HD: -Seen on hemodialysis today.  See above documentation.   -Nephrology team is directing care.   -As per hemodialysis note, patient may resume Monday, Wednesday and Friday schedule on discharge.  History of LV thrombus On Coumadin, continue INR today is 2.4. Coumadin is managed by pharmacy  Insulin-dependent type 2 diabetes with hypoglycemia Last A1c 6.3 Hypoglycemia resolved off insulin. CBGs now stable. 08/18/2019: Continue to monitor closely.  History of renal transplantation: We will defer to the nephrology/transplant team.     DVT prophylaxis: Warfarin  Code Status: Full code  Family Communication:    Disposition Plan: likely skilled nursing facility when bed becomes available and has a secured HD outpatient spot.     Objective: Vitals:   08/17/19 2237 08/18/19 0439 08/18/19 0820 08/18/19 0828  BP: (!) 151/69 (!) 144/72 117/68 (!) 148/78  Pulse: 97 97 91 90  Resp: 20 20 18    Temp: 98.8 F (37.1 C) 98.2 F (36.8 C) 98.3 F (36.8 C)   TempSrc: Oral Oral Oral   SpO2: 96% 97% 94%   Weight: 71.7 kg  70.3 kg   Height:        Intake/Output Summary (Last 24 hours) at 08/18/2019 1015 Last data filed at 08/18/2019 0440 Gross per  24 hour  Intake 100 ml  Output 250 ml  Net -150 ml   Filed Weights   08/17/19 0426 08/17/19 2237 08/18/19 0820  Weight: 74 kg 71.7 kg 70.3 kg    Exam: No changes in PE from 08/16/19.  Marland Kitchen General: Seen on hemodialysis.,  And  stable.  Not in any distress.  Patient is awake and alert. Marland Kitchen HEENT: Pallor.  No jaundice.  Dry buccal mucosa. . Neck: Supple. . CVS: S1-S2. . Lungs: Clear to auscultation. . Abdomen: Soft and nontender.  Organs are not palpable. . Neuro: Awake and alert.  Patient moves all extremities. . Extremities: No leg edema.   Data Reviewed: CBC: Recent Labs  Lab 08/12/19 0630 08/14/19 0833 08/15/19 0929 08/17/19 1434 08/18/19 0206  WBC 8.2 6.7 6.0 7.5 9.1  NEUTROABS 5.8  --   --   --   --   HGB 8.2* 8.4* 8.8* 8.7* 9.2*  HCT 25.3* 26.4* 27.0* 27.5* 28.6*  MCV 104.1* 105.2* 104.2* 104.2* 103.6*  PLT 206 168 172 131* 123456*   Basic Metabolic Panel: Recent Labs  Lab 08/14/19 1053 08/15/19 0929 08/16/19 1500 08/17/19 1059 08/18/19 0206  NA 135 138 134* 132* 132*  K 4.8 5.1 4.0 4.8 5.5*  CL 98 98 97* 93* 95*  CO2 27 28 27 29 22   GLUCOSE 77 79 124* 109* 182*  BUN 51* 58* 48* 33* 42*  CREATININE 7.38* 8.91* 7.67* 7.10* 8.25*  CALCIUM 7.6* 8.2* 7.8* 7.9* 8.5*  PHOS 2.9 4.0 2.3* 3.0 2.7   GFR: Estimated Creatinine Clearance: 8.8 mL/min (A) (by C-G formula based on SCr of 8.25 mg/dL (H)). Liver Function Tests: Recent Labs  Lab 08/12/19 0630 08/13/19 1808 08/14/19 0832 08/14/19 1053 08/15/19 0929 08/16/19 1500 08/17/19 1059 08/18/19 0206  AST <5* 12* 7*  --   --   --   --   --   ALT 10 10 11   --   --   --   --   --   ALKPHOS 63 55 54  --   --   --   --   --   BILITOT 0.4 0.5 0.6  --   --   --   --   --   PROT 5.6* 5.3* 5.7*  --   --   --   --   --   ALBUMIN 2.1* 2.2* 2.3* 2.4* 2.5* 2.6* 2.6* 2.7*   No results for input(s): LIPASE, AMYLASE in the last 168 hours. No results for input(s): AMMONIA in the last 168 hours. Coagulation Profile: Recent Labs  Lab 08/14/19 0832 08/15/19 0345 08/16/19 0406 08/17/19 1434 08/18/19 0206  INR 2.8* 2.9* 2.9* 2.5* 2.4*   Cardiac Enzymes: No results for input(s): CKTOTAL, CKMB, CKMBINDEX, TROPONINI in the last 168 hours. BNP (last  3 results) No results for input(s): PROBNP in the last 8760 hours. HbA1C: No results for input(s): HGBA1C in the last 72 hours. CBG: Recent Labs  Lab 08/17/19 1723 08/17/19 2031 08/18/19 0019 08/18/19 0345 08/18/19 0737  GLUCAP 114* 188* 195* 204* 116*   Lipid Profile: No results for input(s): CHOL, HDL, LDLCALC, TRIG, CHOLHDL, LDLDIRECT in the last 72 hours. Thyroid Function Tests: No results for input(s): TSH, T4TOTAL, FREET4, T3FREE, THYROIDAB in the last 72 hours. Anemia Panel: No results for input(s): VITAMINB12, FOLATE, FERRITIN, TIBC, IRON, RETICCTPCT in the last 72 hours. Urine analysis: No results found for: COLORURINE, APPEARANCEUR, LABSPEC, PHURINE, GLUCOSEU, HGBUR, BILIRUBINUR, KETONESUR, PROTEINUR, UROBILINOGEN, NITRITE, LEUKOCYTESUR Sepsis Labs: @LABRCNTIP (procalcitonin:4,lacticidven:4)  )  No results found for this or any previous visit (from the past 240 hour(s)).    Studies: No results found.  Scheduled Meds: .  stroke: mapping our early stages of recovery book   Does not apply Once  . allopurinol  100 mg Oral Daily  . aspirin  81 mg Oral Daily  . atorvastatin  40 mg Oral Daily  . calcium acetate  1,334 mg Oral TID WC  . Chlorhexidine Gluconate Cloth  6 each Topical Q0600  . Chlorhexidine Gluconate Cloth  6 each Topical Q0600  . darbepoetin (ARANESP) injection - DIALYSIS  100 mcg Intravenous Q Sat-HD  . heparin  8,000 Units Dialysis Once in dialysis  . metoCLOPramide  5 mg Oral TID WC  . metoprolol succinate  25 mg Oral Daily  . multivitamin  1 tablet Oral QHS  . pantoprazole  40 mg Oral Daily  . predniSONE  5 mg Oral Q breakfast  . sevelamer carbonate  2.4 g Oral TID WC  . sodium chloride flush  3 mL Intravenous Q12H  . Warfarin - Pharmacist Dosing Inpatient   Does not apply q1800    Continuous Infusions: . sodium chloride    . sodium chloride       LOS: 19 days     Bonnell Public, MD [facility]  If 7PM-7AM, please contact  night-coverage [email or web address] Password [email or web address] 08/18/2019, 10:15 AM

## 2019-08-19 ENCOUNTER — Inpatient Hospital Stay (HOSPITAL_COMMUNITY): Payer: Medicare Other

## 2019-08-19 LAB — RENAL FUNCTION PANEL
Albumin: 2.5 g/dL — ABNORMAL LOW (ref 3.5–5.0)
Anion gap: 10 (ref 5–15)
BUN: 29 mg/dL — ABNORMAL HIGH (ref 8–23)
CO2: 30 mmol/L (ref 22–32)
Calcium: 8.2 mg/dL — ABNORMAL LOW (ref 8.9–10.3)
Chloride: 98 mmol/L (ref 98–111)
Creatinine, Ser: 7.01 mg/dL — ABNORMAL HIGH (ref 0.61–1.24)
GFR calc Af Amer: 9 mL/min — ABNORMAL LOW (ref >60)
GFR calc non Af Amer: 7 mL/min — ABNORMAL LOW (ref >60)
Glucose, Bld: 102 mg/dL — ABNORMAL HIGH (ref 70–99)
Phosphorus: 2.7 mg/dL (ref 2.5–4.6)
Potassium: 4.3 mmol/L (ref 3.5–5.1)
Sodium: 138 mmol/L (ref 135–145)

## 2019-08-19 LAB — CBC
HCT: 26.2 % — ABNORMAL LOW (ref 39.0–52.0)
Hemoglobin: 8.5 g/dL — ABNORMAL LOW (ref 13.0–17.0)
MCH: 33.2 pg (ref 26.0–34.0)
MCHC: 32.4 g/dL (ref 30.0–36.0)
MCV: 102.3 fL — ABNORMAL HIGH (ref 80.0–100.0)
Platelets: 121 10*3/uL — ABNORMAL LOW (ref 150–400)
RBC: 2.56 MIL/uL — ABNORMAL LOW (ref 4.22–5.81)
RDW: 15.1 % (ref 11.5–15.5)
WBC: 6.4 10*3/uL (ref 4.0–10.5)
nRBC: 0 % (ref 0.0–0.2)

## 2019-08-19 LAB — GLUCOSE, CAPILLARY
Glucose-Capillary: 121 mg/dL — ABNORMAL HIGH (ref 70–99)
Glucose-Capillary: 152 mg/dL — ABNORMAL HIGH (ref 70–99)
Glucose-Capillary: 172 mg/dL — ABNORMAL HIGH (ref 70–99)
Glucose-Capillary: 251 mg/dL — ABNORMAL HIGH (ref 70–99)
Glucose-Capillary: 97 mg/dL (ref 70–99)

## 2019-08-19 LAB — PROTIME-INR
INR: 2.4 — ABNORMAL HIGH (ref 0.8–1.2)
Prothrombin Time: 26 seconds — ABNORMAL HIGH (ref 11.4–15.2)

## 2019-08-19 MED ORDER — HEPARIN SODIUM (PORCINE) 1000 UNIT/ML DIALYSIS
20.0000 [IU]/kg | INTRAMUSCULAR | Status: DC | PRN
  Administered 2019-08-20: 1400 [IU] via INTRAVENOUS_CENTRAL

## 2019-08-19 MED ORDER — SODIUM CHLORIDE 0.9 % IV SOLN: 100.0000 mL | INTRAVENOUS | Status: DC | PRN

## 2019-08-19 MED ORDER — CALCIUM ACETATE (PHOS BINDER) 667 MG PO CAPS
667.0000 mg | ORAL_CAPSULE | Freq: Three times a day (TID) | ORAL | Status: DC
  Administered 2019-08-19 – 2019-08-22 (×7): 667 mg via ORAL
  Filled 2019-08-19 (×8): qty 1

## 2019-08-19 MED ORDER — ALTEPLASE 2 MG IJ SOLR: 2.0000 mg | Freq: Once | INTRAMUSCULAR | Status: DC | PRN

## 2019-08-19 MED ORDER — HEPARIN SODIUM (PORCINE) 1000 UNIT/ML DIALYSIS: 1000.0000 [IU] | INTRAMUSCULAR | Status: DC | PRN

## 2019-08-19 MED ORDER — PRO-STAT SUGAR FREE PO LIQD
30.0000 mL | Freq: Two times a day (BID) | ORAL | Status: DC
  Administered 2019-08-19 – 2019-09-22 (×50): 30 mL via ORAL
  Filled 2019-08-19 (×63): qty 30

## 2019-08-19 MED ORDER — LIDOCAINE HCL (PF) 1 % IJ SOLN: 5.0000 mL | INTRAMUSCULAR | Status: DC | PRN

## 2019-08-19 MED ORDER — DARBEPOETIN ALFA 100 MCG/0.5ML IJ SOSY
100.0000 ug | PREFILLED_SYRINGE | INTRAMUSCULAR | Status: DC
  Filled 2019-08-19: qty 0.5

## 2019-08-19 MED ORDER — WARFARIN SODIUM 2 MG PO TABS
2.0000 mg | ORAL_TABLET | Freq: Once | ORAL | Status: AC
  Administered 2019-08-19: 2 mg via ORAL
  Filled 2019-08-19: qty 1

## 2019-08-19 MED ORDER — HEPARIN SODIUM (PORCINE) 1000 UNIT/ML DIALYSIS: 100.0000 [IU]/kg | INTRAMUSCULAR | Status: DC | PRN

## 2019-08-19 MED ORDER — PENTAFLUOROPROP-TETRAFLUOROETH EX AERO: 1.0000 "application " | INHALATION_SPRAY | CUTANEOUS | Status: DC | PRN

## 2019-08-19 MED ORDER — LIDOCAINE-PRILOCAINE 2.5-2.5 % EX CREA: 1.0000 "application " | TOPICAL_CREAM | CUTANEOUS | Status: DC | PRN

## 2019-08-19 MED ORDER — HEPARIN SODIUM (PORCINE) 1000 UNIT/ML DIALYSIS: 6000.0000 [IU] | Freq: Once | INTRAMUSCULAR | Status: DC

## 2019-08-19 NOTE — Progress Notes (Signed)
  Speech Language Pathology Treatment: Dysphagia  Patient Details Name: Jonathan Scholtes Sr. MRN: [id number] DOB: 04/07/1951 Today's Date: 08/19/2019 Time: [id number] SLP Time Calculation (min) (ACUTE ONLY): 16 min  Assessment / Plan / Recommendation Clinical Impression  Pt seen laying flat trying to hand feed himself from his breakfast container laying on his lap. Pt repositioned and continued to required verbal cues for safety awareness and problem solving during meal task, though he was otherwise alert, attentive and able to initiate and follow commands. When eating, pts mastication and ability to initiate bolus formation and transit improved once upright. No coughing seen throughout session with pt self feeding a mix of nectar and thin liquids though intermittent wet vocal quality still observed. Will proceed with MBS for instrumental assessment today for best diet recommendation prior to d/c.    HPI HPI: Jonathan Gipp Sr. is a 69 y.o. male with history of ESRD on hemodialysis, LV thrombus, renal transplant, stroke, hypertension diabetes mellitus was having increasing difficulty swallowing over the last 2 weeks as noticed by patient's wife (pocketing his food. Per chart 6 days ago diagnosed with Covid at the time he was not symptomatic. Now more short of breath and lethargic and confused. 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 Chest x-ray showing multifocal pneumonia.      SLP Plan          Recommendations  Diet recommendations: Dysphagia 2 (fine chop);Thin liquid Liquids provided via: Cup;Straw Medication Administration: Whole meds with puree Supervision: Intermittent supervision to cue for compensatory strategies                Oral Care Recommendations: Oral care BID Follow up Recommendations: Skilled Nursing facility SLP Visit Diagnosis:  Dysphagia, oropharyngeal phase (R13.12)       GO               Herbie Baltimore, MA [place] Pager [phone] Office [phone]  Lynann Beaver 08/19/2019, 9:43 AM

## 2019-08-19 NOTE — Progress Notes (Signed)
Hospitalist progress note   Jonathan Dornbusch Sr. 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  Covid versus LV thrombus Continue aspirin Lipitor Coumadin Will need skilled facility placement ESRD/HD Prior was on MWF-further management per them LV thrombus Therapeutic on Coumadin continue dosing as per pharmacy Prior cadaveric renal transplant Continue prednisone 5  Subjective: Somewhat laconic seen on Hd no c/o Consultants:   renal Procedures:   n Antimicrobials:   n    Objective: Vitals:   08/18/19 1700 08/18/19 2211 08/19/19 0457 08/19/19 0700  BP: 124/64 127/73 102/70 (!) 106/57  Pulse: 88 97 92 90  Resp: 18 16 16 16   Temp: 99 F (37.2 C) 98.2 F (36.8 C) 98 F (36.7 C) 98.9 F (37.2 C)  TempSrc: Oral Oral Oral Oral  SpO2: 98% 94% 90% 92%  Weight:      Height:        Intake/Output Summary (Last 24 hours) at 08/19/2019 1337 Last data filed at 08/19/2019 1000 Gross per 24 hour  Intake 543 ml  Output 0 ml  Net 543 ml   Filed Weights   08/17/19 2237 08/18/19 0820 08/18/19 1058  Weight: 71.7 kg 70.3 kg 69 kg    Examination: eomi ncat no focal defciits  cta b no added sound abd soft nt nd s1 s 2no m Neuro intact  Scheduled Meds: .  stroke: mapping our early  stages of recovery book   Does not apply Once  . allopurinol  100 mg Oral Daily  . aspirin  81 mg Oral Daily  . atorvastatin  40 mg Oral Daily  . calcium acetate  667 mg Oral TID WC  . Chlorhexidine Gluconate Cloth  6 each Topical Q0600  . darbepoetin (ARANESP) injection - DIALYSIS  100 mcg Intravenous Q Mon-HD  . feeding supplement (PRO-STAT SUGAR FREE 64)  30 mL Oral BID  . [START ON 08/20/2019] heparin  6,000 Units Dialysis Once in dialysis  . metoCLOPramide  5 mg Oral TID WC  . metoprolol succinate  25 mg Oral Daily  . multivitamin  1 tablet Oral QHS  . pantoprazole  40 mg Oral Daily  . predniSONE  5 mg Oral Q breakfast  . sodium chloride flush  3 mL Intravenous Q12H  . warfarin  2 mg Oral ONCE-1800  . Warfarin - Pharmacist Dosing Inpatient   Does not apply q1800   Continuous Infusions: . sodium chloride    . sodium chloride       LOS: 20 days   Time spent: [place], MD [facility]  08/19/2019, 1:37 PM

## 2019-08-19 NOTE — Progress Notes (Signed)
Two attempts to get pt were made, but pt is still in HD.  Retry at another time.     08/19/19 1300  PT Visit Information  Last PT Received On 08/19/19  Reason Eval/Treat Not Completed Patient at procedure or test/unavailable    Mee Hives, PT MS Acute Rehab Dept. Number: [place] and [place]

## 2019-08-19 NOTE — Care Management Important Message (Signed)
Important Message  Patient Details  Name: Jonathan Hanrahan Sr. MRN: [id number] Date of Birth: [birth year]   Medicare Important Message Given:  Yes     [name] Montine Circle 08/19/2019, 3:57 PM

## 2019-08-19 NOTE — Progress Notes (Signed)
[place] for Warfarin Indication: h/o LV thrombus and new CVA   No Known Allergies  Patient Measurements: Height: 5\' 11"  (180.3 cm) Weight: 152 lb 1.9 oz (69 kg) IBW/kg (Calculated) : 75.3  Vital Signs: Temp: 98.9 F (37.2 C) (01/11 0700) Temp Source: Oral (01/11 0700) BP: 106/57 (01/11 0700) Pulse Rate: 90 (01/11 0700)  Labs: Recent Labs    08/16/19 1500 08/17/19 1059 08/17/19 1434 08/18/19 0206  HGB  --   --  8.7* 9.2*  HCT  --   --  27.5* 28.6*  PLT  --   --  131* 144*  LABPROT  --   --  26.5* 26.0*  INR  --   --  2.5* 2.4*  CREATININE 7.67* 7.10*  --  8.25*    Estimated Creatinine Clearance: 8.6 mL/min (A) (by C-G formula based on SCr of 8.25 mg/dL (H)).   Assessment: 69 y.o. male admitted with acute respiratory failure, hx LV thrombus on Eliquis PTA]. Last Eliquis dose ~1000 on 12/22. Patient transitioned to heparin on admission with lower goals targeted due to new CVA (MRI confirmed) and risk of hemorrhagic conversion. Heparin infusion discontinued on 12/30 and patient transitioned to warfarin.   Pharmacy consulted to dose warfarin for LV thrombus.   INR today remains therapeutic (INR 2.4, goal of 2-3). Hgb/Hct/Plt slight drop - no active bleeding noted.    Previous pharmacist spoke to patient's wife re: warfarin education. Patient has been on warfarin in the past.    Goal of Therapy:  INR goal 2 - 3 Monitor platelets by anticoagulation protocol: Yes   Plan:  - Warfarin 2 mg PO x1 tonight  - Daily PT/INR, CBC q72h - Will continue to monitor for any signs/symptoms of bleeding and will follow up with PT/INR in the a.m.    Thank you for allowing pharmacy to be a part of this patient's care.  Alycia Rossetti, PharmD, BCPS Clinical Pharmacist Clinical phone for 08/19/2019: [phone] 08/19/2019 11:36 AM   **Pharmacist phone directory can now be found on [email or web address] ([email or web address]).  Listed under [place].

## 2019-08-19 NOTE — Progress Notes (Signed)
[facility] Progress Note   Subjective: Seen on HD. SBP in 70s. Using AVF with 16 g needles. No issues cannulating. No C/Os. Asymptomatic with low BP.   Objective Vitals:   08/18/19 1700 08/18/19 2211 08/19/19 0457 08/19/19 0700  BP: 124/64 127/73 102/70 (!) 106/57  Pulse: 88 97 92 90  Resp: 18 16 16 16   Temp: 99 F (37.2 C) 98.2 F (36.8 C) 98 F (36.7 C) 98.9 F (37.2 C)  TempSrc: Oral Oral Oral Oral  SpO2: 98% 94% 90% 92%  Weight:      Height:       Physical Exam General: Chronically ill appearing male in NAD Heart: S1,S2 RRR no M/R/G Lungs: CTAB A/P Abdomen: S, NT active BS Extremities: No LE edema Dialysis Access: R AVF cannulated at present. RIJ TDC drsg CDI.   Additional Objective Labs: Basic Metabolic Panel: Recent Labs  Lab 08/17/19 1059 08/18/19 0206 08/19/19 0838  NA 132* 132* 138  K 4.8 5.5* 4.3  CL 93* 95* 98  CO2 29 22 30   GLUCOSE 109* 182* 102*  BUN 33* 42* 29*  CREATININE 7.10* 8.25* 7.01*  CALCIUM 7.9* 8.5* 8.2*  PHOS 3.0 2.7 2.7   Liver Function Tests: Recent Labs  Lab 08/13/19 1808 08/14/19 0832 08/17/19 1059 08/18/19 0206 08/19/19 0838  AST 12* 7*  --   --   --   ALT 10 11  --   --   --   ALKPHOS 55 54  --   --   --   BILITOT 0.5 0.6  --   --   --   PROT 5.3* 5.7*  --   --   --   ALBUMIN 2.2* 2.3* 2.6* 2.7* 2.5*   No results for input(s): LIPASE, AMYLASE in the last 168 hours. CBC: Recent Labs  Lab 08/14/19 0833 08/15/19 0929 08/17/19 1434 08/18/19 0206 08/19/19 0826  WBC 6.7 6.0 7.5 9.1 6.4  HGB 8.4* 8.8* 8.7* 9.2* 8.5*  HCT 26.4* 27.0* 27.5* 28.6* 26.2*  MCV 105.2* 104.2* 104.2* 103.6* 102.3*  PLT 168 172 131* 144* 121*   Blood Culture    Component Value Date/Time   SDES BLOOD SITE NOT SPECIFIED 07/30/2019 1843   SPECREQUEST  07/30/2019 1843    BOTTLES DRAWN AEROBIC AND ANAEROBIC Blood Culture results may not be optimal due to an inadequate volume of blood received in culture bottles   CULT   07/30/2019 1843    NO GROWTH 5 DAYS Performed at [facility], [place] [address] [place], [place] [address]    REPTSTATUS 08/04/2019 FINAL 07/30/2019 1843    Cardiac Enzymes: No results for input(s): CKTOTAL, CKMB, CKMBINDEX, TROPONINI in the last 168 hours. CBG: Recent Labs  Lab 08/18/19 1612 08/18/19 2251 08/19/19 0051 08/19/19 0518 08/19/19 0722  GLUCAP 169* 183* 152* 121* 97   Iron Studies: No results for input(s): IRON, TIBC, TRANSFERRIN, FERRITIN in the last 72 hours. @lablastinr3 @ Studies/Results: No results found. Medications: . sodium chloride    . sodium chloride     .  stroke: mapping our early stages of recovery book   Does not apply Once  . allopurinol  100 mg Oral Daily  . aspirin  81 mg Oral Daily  . atorvastatin  40 mg Oral Daily  . calcium acetate  1,334 mg Oral TID WC  . Chlorhexidine Gluconate Cloth  6 each Topical Q0600  . darbepoetin (ARANESP) injection - DIALYSIS  100 mcg Intravenous Q Mon-HD  . [START ON 08/20/2019]  heparin  6,000 Units Dialysis Once in dialysis  . metoCLOPramide  5 mg Oral TID WC  . metoprolol succinate  25 mg Oral Daily  . multivitamin  1 tablet Oral QHS  . pantoprazole  40 mg Oral Daily  . predniSONE  5 mg Oral Q breakfast  . sevelamer carbonate  2.4 g Oral TID WC  . sodium chloride flush  3 mL Intravenous Q12H  . warfarin  2 mg Oral ONCE-1800  . Warfarin - Pharmacist Dosing Inpatient   Does not apply q1800   HD orders: T,Th,S G/O Back to [facility] when COVID 19 negative 4 hr 15 min 180NRE 400/800 80.5 kg 2.0 K/ 2.0 Ca R AVF/RIJ TDC -Heparin 8000 units IV TIW -Mircera 75 mcg IV q 2 weeks (last dose 07/25/19) -Venofer 50 mg IV weekly -Parsabiv 7.5 mg IV TIW   Assessment/Plan: 1. Acute COVID-19 pneumonia: s/p remdesivir and Decadron.  As per primary team.  Now out of 21 day isolation window 2. ESRD -HD per TTS schedule recently. HD off schedule D/T high census. Try to get back on schedule tomorrow. Still currently assigned  to G/O. Will need two negative COVID tests prior to returning to [facility]. Using AVF here. Need to get [facility] out when possible.  3. Anemia - HGB 8.5. ON weekly aranesp 100 mcg IV weekly. Missed dose last week. Give today.  4. Secondary hyperparathyroidism - Phos is low 2.7. Decrease calcium acetate, hold renvela powder. Parsabiv not on hospital formulary.  5. HTN/volume -Volume status seems fine. BP very soft on HD, down to SBP 70s. Asymptomatic. Decrease goal to 1 liter.  6. Nutrition -Albumin low. Dys 1 diet. Add prostat/renal vit.  7. Acute CVA-probably embolic. On ASA, Coumadin, lipitor.   Disposition: Awaiting SNF placement  Tamar Lipscomb H. Lauris Keepers NP-C 08/19/2019, 12:48 PM  [employer] [phone]

## 2019-08-19 NOTE — Plan of Care (Signed)
  Problem: Coping: Goal: Psychosocial and spiritual needs will be supported Outcome: Progressing   Problem: Self-Care: Goal: Ability to participate in self-care as condition permits will improve Outcome: Progressing

## 2019-08-20 ENCOUNTER — Inpatient Hospital Stay (HOSPITAL_COMMUNITY): Payer: Medicare Other

## 2019-08-20 LAB — RENAL FUNCTION PANEL
Albumin: 2.3 g/dL — ABNORMAL LOW (ref 3.5–5.0)
Anion gap: 9 (ref 5–15)
BUN: 26 mg/dL — ABNORMAL HIGH (ref 8–23)
CO2: 28 mmol/L (ref 22–32)
Calcium: 7.6 mg/dL — ABNORMAL LOW (ref 8.9–10.3)
Chloride: 94 mmol/L — ABNORMAL LOW (ref 98–111)
Creatinine, Ser: 5.56 mg/dL — ABNORMAL HIGH (ref 0.61–1.24)
GFR calc Af Amer: 11 mL/min — ABNORMAL LOW (ref >60)
GFR calc non Af Amer: 10 mL/min — ABNORMAL LOW (ref >60)
Glucose, Bld: 184 mg/dL — ABNORMAL HIGH (ref 70–99)
Phosphorus: 2.6 mg/dL (ref 2.5–4.6)
Potassium: 3.9 mmol/L (ref 3.5–5.1)
Sodium: 131 mmol/L — ABNORMAL LOW (ref 135–145)

## 2019-08-20 LAB — GLUCOSE, CAPILLARY
Glucose-Capillary: 140 mg/dL — ABNORMAL HIGH (ref 70–99)
Glucose-Capillary: 201 mg/dL — ABNORMAL HIGH (ref 70–99)
Glucose-Capillary: 253 mg/dL — ABNORMAL HIGH (ref 70–99)
Glucose-Capillary: 254 mg/dL — ABNORMAL HIGH (ref 70–99)
Glucose-Capillary: 261 mg/dL — ABNORMAL HIGH (ref 70–99)

## 2019-08-20 LAB — CBC
HCT: 25.2 % — ABNORMAL LOW (ref 39.0–52.0)
Hemoglobin: 8 g/dL — ABNORMAL LOW (ref 13.0–17.0)
MCH: 32.7 pg (ref 26.0–34.0)
MCHC: 31.7 g/dL (ref 30.0–36.0)
MCV: 102.9 fL — ABNORMAL HIGH (ref 80.0–100.0)
Platelets: 126 10*3/uL — ABNORMAL LOW (ref 150–400)
RBC: 2.45 MIL/uL — ABNORMAL LOW (ref 4.22–5.81)
RDW: 15 % (ref 11.5–15.5)
WBC: 5.7 10*3/uL (ref 4.0–10.5)
nRBC: 0 % (ref 0.0–0.2)

## 2019-08-20 LAB — PROTIME-INR
INR: 2.1 — ABNORMAL HIGH (ref 0.8–1.2)
Prothrombin Time: 23.4 seconds — ABNORMAL HIGH (ref 11.4–15.2)

## 2019-08-20 MED ORDER — DARBEPOETIN ALFA 100 MCG/0.5ML IJ SOSY
100.0000 ug | PREFILLED_SYRINGE | INTRAMUSCULAR | Status: DC
  Administered 2019-08-20: 100 ug via INTRAVENOUS

## 2019-08-20 MED ORDER — WARFARIN SODIUM 3 MG PO TABS
3.0000 mg | ORAL_TABLET | Freq: Once | ORAL | Status: DC
  Filled 2019-08-20: qty 1

## 2019-08-20 MED ORDER — HEPARIN SODIUM (PORCINE) 1000 UNIT/ML IJ SOLN
INTRAMUSCULAR | Status: AC
  Filled 2019-08-20: qty 2

## 2019-08-20 MED ORDER — DARBEPOETIN ALFA 100 MCG/0.5ML IJ SOSY
PREFILLED_SYRINGE | INTRAMUSCULAR | Status: AC
  Filled 2019-08-20: qty 0.5

## 2019-08-20 NOTE — TOC Progression Note (Signed)
Transition of Care (TOC) - Progression Note    Patient Details  Name: Jonathan [name]. MRN: [id number] Date of Birth: 03-07-1951  Transition of Care [facility]) CM/SW Contact  Sharlet Salina Mila Homer, LCSW Phone Number: 08/20/2019, 1:51 PM  Clinical Narrative: Continuing search for a SNF bed for ST rehab. To date, no bed offers and search has been expanded to other counties. CSW will continue seeking SNF placement for patient.     Expected Discharge Plan: [place]    Expected Discharge Plan and Services Expected Discharge Plan: [place] arrangements for the past 2 months: Single Family Home                                     Social Determinants of Health (SDOH) Interventions    Readmission Risk Interventions No flowsheet data found.

## 2019-08-20 NOTE — Progress Notes (Signed)
[facility] Progress Note   Subjective: Seen in room. Just finished HD.     Objective Vitals:   08/20/19 0830 08/20/19 0900 08/20/19 0930 08/20/19 0954  BP: 123/65 109/62 108/66 (!) 143/75  Pulse: 83 79 80 76  Resp:    16  Temp:    97.9 F (36.6 C)  TempSrc:    Oral  SpO2:      Weight:      Height:       Physical Exam General: Chronically ill appearing male in NAD Heart: S1,S2 RRR no M/R/G Lungs: CTAB A/P Abdomen: S, NT active BS Extremities: No LE edema Dialysis Access: R AVF +T/B. Using 16g needles without issues.  RIJ TDC drsg CDI.   Additional Objective Labs: Basic Metabolic Panel: Recent Labs  Lab 08/18/19 0206 08/19/19 0838 08/20/19 0728  NA 132* 138 131*  K 5.5* 4.3 3.9  CL 95* 98 94*  CO2 22 30 28   GLUCOSE 182* 102* 184*  BUN 42* 29* 26*  CREATININE 8.25* 7.01* 5.56*  CALCIUM 8.5* 8.2* 7.6*  PHOS 2.7 2.7 2.6   Liver Function Tests: Recent Labs  Lab 08/13/19 1808 08/14/19 0832 08/18/19 0206 08/19/19 0838 08/20/19 0728  AST 12* 7*  --   --   --   ALT 10 11  --   --   --   ALKPHOS 55 54  --   --   --   BILITOT 0.5 0.6  --   --   --   PROT 5.3* 5.7*  --   --   --   ALBUMIN 2.2* 2.3* 2.7* 2.5* 2.3*   No results for input(s): LIPASE, AMYLASE in the last 168 hours. CBC: Recent Labs  Lab 08/15/19 0929 08/17/19 1434 08/18/19 0206 08/19/19 0826 08/20/19 0728  WBC 6.0 7.5 9.1 6.4 5.7  HGB 8.8* 8.7* 9.2* 8.5* 8.0*  HCT 27.0* 27.5* 28.6* 26.2* 25.2*  MCV 104.2* 104.2* 103.6* 102.3* 102.9*  PLT 172 131* 144* 121* 126*   Blood Culture    Component Value Date/Time   SDES BLOOD SITE NOT SPECIFIED 07/30/2019 1843   SPECREQUEST  07/30/2019 1843    BOTTLES DRAWN AEROBIC AND ANAEROBIC Blood Culture results may not be optimal due to an inadequate volume of blood received in culture bottles   CULT  07/30/2019 1843    NO GROWTH 5 DAYS Performed at [facility], [place] [address] [place], [place] [address]    REPTSTATUS  08/04/2019 FINAL 07/30/2019 1843    Cardiac Enzymes: No results for input(s): CKTOTAL, CKMB, CKMBINDEX, TROPONINI in the last 168 hours. CBG: Recent Labs  Lab 08/19/19 0722 08/19/19 1621 08/19/19 2032 08/20/19 0021 08/20/19 0425  GLUCAP 97 172* 251* 201* 253*   Iron Studies: No results for input(s): IRON, TIBC, TRANSFERRIN, FERRITIN in the last 72 hours. @lablastinr3 @ Studies/Results: No results found. Medications:  .  stroke: mapping our early stages of recovery book   Does not apply Once  . allopurinol  100 mg Oral Daily  . aspirin  81 mg Oral Daily  . atorvastatin  40 mg Oral Daily  . calcium acetate  667 mg Oral TID WC  . Chlorhexidine Gluconate Cloth  6 each Topical Q0600  . darbepoetin (ARANESP) injection - DIALYSIS  100 mcg Intravenous Q Tue-HD  . feeding supplement (PRO-STAT SUGAR FREE 64)  30 mL Oral BID  . metoCLOPramide  5 mg Oral TID WC  . metoprolol succinate  25 mg Oral Daily  . multivitamin  1 tablet Oral QHS  . pantoprazole  40 mg Oral Daily  . predniSONE  5 mg Oral Q breakfast  . sodium chloride flush  3 mL Intravenous Q12H  . warfarin  3 mg Oral ONCE-1800  . Warfarin - Pharmacist Dosing Inpatient   Does not apply q1800     HD orders: T,Th,S G/O Back to [facility] when COVID 19 negative 4 hr 15 min 180NRE 400/800 80.5 kg 2.0 K/ 2.0 Ca R AVF/RIJ TDC -Heparin 8000 units IV TIW -Mircera 75 mcg IV q 2 weeks (last dose 07/25/19) -Venofer 50 mg IV weekly -Parsabiv 7.5 mg IV TIW   Assessment/Plan: 1. Acute COVID-19 pneumonia: s/p remdesivir and Decadron. As per primary team. Now out of 21 day isolation window 2. ESRD -HD per TTS schedule recently. HD today on schedule. Tolerated without issues.  Still currently assigned to G/O. Will need two negative COVID tests prior to returning to [facility]. Using AVF here. Need to get [facility] out when possible. If he can use 15g needles next HD will send to IR to remove TDC.  3. Anemia - HGB 8.0. ON weekly aranesp 100 mcg IV  weekly. Missed dose last week. Given 08/19/19.  4. Secondary hyperparathyroidism - Phos is low 2.7. Decrease calcium acetate, hold renvela powder. Parsabiv not on hospital formulary.  5. HTN/volume -Volume status seems fine. HD today Net UF 1.0 liter post wt not done. Well below OP EDW-lower wt on DC.  6. Nutrition -Albumin low. Dys 1 diet. Add prostat/renal vit.  7. Acute CVA-probably embolic. On ASA, Coumadin, lipitor.   Disposition: Awaiting SNF placement Libertie Hausler H. Shamanda Len NP-C 08/20/2019, 11:18 AM  [employer] [phone]

## 2019-08-20 NOTE — Progress Notes (Signed)
Pt in a procedure and will retry as time and pt allow.   08/20/19 1000  PT Visit Information  Last PT Received On 08/20/19  Reason Eval/Treat Not Completed Patient at procedure or test/unavailable    Mee Hives, PT MS Acute Rehab Dept. Number: [place] and [place]

## 2019-08-20 NOTE — Progress Notes (Signed)
Inpatient Diabetes Program Recommendations  AACE/ADA: New Consensus Statement on Inpatient Glycemic Control   Target Ranges:  Prepandial:   less than 140 mg/dL      Peak postprandial:   less than 180 mg/dL (1-2 hours)      Critically ill patients:  140 - 180 mg/dL   Results for Jonathan [name], Jonathan SR. (MRN [id number]) as of 08/20/2019 10:27  Ref. Range 08/19/2019 07:22 08/19/2019 16:21 08/19/2019 20:32 08/20/2019 00:21 08/20/2019 04:25  Glucose-Capillary Latest Ref Range: 70 - 99 mg/dL 97 172 (H) 251 (H) 201 (H) 253 (H)   Review of Glycemic Control  Diabetes history: DM2 Outpatient Diabetes medications: Tresiba 12 units daily Current orders for Inpatient glycemic control: CBGs Q4H  Inpatient Diabetes Program Recommendations:   Correction (SSI): Please consider ordering CBGs AC&HS with Novolog 0-9 units TID with meals and Novolog 0-5 units QHS.  Thanks, Barnie Alderman, RN, MSN, CDE Diabetes Coordinator Inpatient Diabetes Program [phone] (Team Pager from 8am to 5pm)

## 2019-08-20 NOTE — Progress Notes (Signed)
Physical Therapy Treatment Patient Details Name: Jonathan Mathieu Sr. MRN: [id number] DOB: June 16, 1951 Today's Date: 08/20/2019    History of Present Illness 69 y.o. 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 Diagnosed with COVID 6 days prior to coming to ED, where chest x-ray showed multifocal pneumonia. Admitted 07/30/19 for treatment of acute encephalopathy, difficulty swallowing and pneumonia.MRI of the brain showed small acute infarcts in the left pons, left occipital lob and right frontal lobe in addition to chronic infarcts     PT Comments    Pt was seen for mobility and strength training, but declined to work on [place].  Did agree to there ex, and was in bed with his head covered.  After uncovering he refused further therapy and asked PT to leave.  Follow acutely to continue to encourage him to exercise and recover mobility.   Follow Up Recommendations  SNF     Equipment Recommendations  None recommended by PT    Recommendations for Other Services       Precautions / Restrictions Precautions Precautions: Fall Restrictions Weight Bearing Restrictions: No    Mobility  Bed Mobility Overal bed mobility: Needs Assistance             General bed mobility comments: declined  Transfers Overall transfer level: Needs assistance               General transfer comment: declined  Ambulation/Gait                 Stairs             Wheelchair Mobility    Modified Rankin (Stroke Patients Only)       Balance                                            Cognition Arousal/Alertness: Lethargic;Awake/alert Behavior During Therapy: Flat affect;Agitated Overall Cognitive Status: Impaired/Different from baseline Area of Impairment:  Orientation;Memory;Safety/judgement;Awareness;Following commands;Problem solving;Attention                 Orientation Level: Situation Current Attention Level: Selective Memory: Decreased short-term memory Following Commands: Follows one step commands inconsistently;Follows one step commands with increased time Safety/Judgement: Decreased awareness of safety;Decreased awareness of deficits Awareness: Intellectual Problem Solving: Slow processing;Requires verbal cues General Comments: pt follows through slowly on cues for ther ex      Exercises General Exercises - Lower Extremity Ankle Circles/Pumps: AAROM;5 reps Quad Sets: AROM;10 reps Gluteal Sets: AROM;10 reps Heel Slides: AROM;10 reps Hip ABduction/ADduction: AROM;10 reps    General Comments General comments (skin integrity, edema, etc.): exercises were only tx pt would do then declined more      Pertinent Vitals/Pain Pain Assessment: No/denies pain    Home Living                      Prior Function            PT Goals (current goals can now be found in the care plan section) Acute Rehab PT Goals Patient Stated Goal: Pt unable to state  Progress towards PT goals: Progressing toward goals    Frequency    Min 2X/week      PT Plan Current plan remains appropriate  Co-evaluation              AM-PAC PT "6 Clicks" Mobility   Outcome Measure  Help needed turning from your back to your side while in a flat bed without using bedrails?: A Little Help needed moving from lying on your back to sitting on the side of a flat bed without using bedrails?: A Little Help needed moving to and from a bed to a chair (including a wheelchair)?: A Little Help needed standing up from a chair using your arms (e.g., wheelchair or bedside chair)?: A Lot Help needed to walk in hospital room?: A Lot Help needed climbing 3-5 steps with a railing? : A Lot 6 Click Score: 15    End of Session Equipment Utilized  During Treatment: Oxygen Activity Tolerance: Patient tolerated treatment well;Patient limited by fatigue Patient left: in bed;with call bell/phone within reach;with bed alarm set Nurse Communication: Mobility status PT Visit Diagnosis: Unsteadiness on feet (R26.81);Other abnormalities of gait and mobility (R26.89);Muscle weakness (generalized) (M62.81);Ataxic gait (R26.0);Difficulty in walking, not elsewhere classified (R26.2)     Time: [id number] PT Time Calculation (min) (ACUTE ONLY): 14 min  Charges:  $Therapeutic Exercise: 8-22 mins                 Ramond Dial 08/20/2019, 5:14 PM   Mee Hives, PT MS Acute Rehab Dept. Number: [place] and [place]

## 2019-08-20 NOTE — Progress Notes (Addendum)
[place] for Warfarin >> now holding (see addendum) Indication: h/o LV thrombus and new CVA   No Known Allergies  Patient Measurements: Height: 5\' 11"  (180.3 cm) Weight: 158 lb 4.6 oz (71.8 kg) IBW/kg (Calculated) : 75.3  Vital Signs: Temp: 98.3 F (36.8 C) (01/12 0720) Temp Source: Oral (01/12 0720) BP: 123/65 (01/12 0830) Pulse Rate: 83 (01/12 0830)  Labs: Recent Labs    08/18/19 0206 08/19/19 0826 08/19/19 0838 08/20/19 0555 08/20/19 0728  HGB 9.2* 8.5*  --   --  8.0*  HCT 28.6* 26.2*  --   --  25.2*  PLT 144* 121*  --   --  126*  LABPROT 26.0* 26.0*  --  23.4*  --   INR 2.4* 2.4*  --  2.1*  --   CREATININE 8.25*  --  7.01*  --  5.56*    Estimated Creatinine Clearance: 13.3 mL/min (A) (by C-G formula based on SCr of 5.56 mg/dL (H)).   Assessment: 69 y.o. male admitted with acute respiratory failure, hx LV thrombus on Eliquis PTA]. Last Eliquis dose ~1000 on 12/22. Patient transitioned to heparin on admission with lower goals targeted due to new CVA (MRI confirmed) and risk of hemorrhagic conversion. Heparin infusion discontinued on 12/30 and patient transitioned to warfarin.   Pharmacy consulted to dose warfarin for LV thrombus.   INR today remains therapeutic but rending down (INR 2.1 << 2.4, goal of 2-3). Hgb/Hct/Plt slight drop - no active bleeding noted.    Previous pharmacist spoke to patient's wife re: warfarin education. Patient has been on warfarin in the past.    Goal of Therapy:  INR goal 2 - 3 Monitor platelets by anticoagulation protocol: Yes   Plan:  - Warfarin 3 mg PO x1 tonight  - Daily PT/INR, CBC q72h - Will continue to monitor for any signs/symptoms of bleeding and will follow up with PT/INR in the a.m.    Thank you for allowing pharmacy to be a part of this patient's care.  Alycia Rossetti, PharmD, BCPS Clinical Pharmacist Clinical phone for 08/20/2019: [phone] 08/20/2019 9:16 AM   **Pharmacist  phone directory can now be found on [email or web address] ([email or web address]).  Listed under [place].   Addendum:  Renal planning to remove [facility] - likely after HD on Thursday, 1/14 and want warfarin to be held to allow INR to trend down. Once INR<2 - the plan will be to bridge with Heparin given the patient's LV thrombus.  Plan - Hold Warfarin - Plan to initiate Heparin when INR <2 - Will f/u with INR w/ AM labs   Alycia Rossetti, PharmD, BCPS 1:16 PM

## 2019-08-20 NOTE — Plan of Care (Signed)
  Problem: Self-Care: Goal: Ability to participate in self-care as condition permits will improve Outcome: Progressing   Problem: Nutrition: Goal: Risk of aspiration will decrease Outcome: Progressing   

## 2019-08-20 NOTE — Progress Notes (Signed)
Hospitalist progress note   Vahid Graessle Sr. 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  Covid versus LV thrombus Continue aspirin Lipitor Coumadin Will need skilled facility placement Will ask speech to do cognitive eval given mild echolalia although he is clearer today than he was yesterday ESRD/HD Prior was on MWF-further management per them Need perm access LV thrombus Therapeutic on Coumadin--transitioning to heparin as need to have perm access placed Prior cadaveric renal transplant Continue prednisone 5---can probably d/c steroids--defer t trenal  Subjective:  Awake alert ate a good amount of lunch No other c/o  Consultants:   renal Procedures:   n Antimicrobials:   n    Objective: Vitals:   08/20/19 0830 08/20/19 0900 08/20/19 0930 08/20/19 0954  BP: 123/65 109/62 108/66 (!) 143/75  Pulse: 83 79 80 76  Resp:    16  Temp:    97.9 F (36.6 C)  TempSrc:    Oral  SpO2:      Weight:      Height:        Intake/Output Summary (Last 24 hours) at 08/20/2019 1329 Last data filed at 08/20/2019 1200 Gross per 24 hour  Intake 1150 ml  Output 1100 ml  Net 50 ml   Filed Weights   08/19/19 1115 08/19/19 1400 08/20/19 0725  Weight: 72.1 kg 71 kg 71.8 kg     Examination:  Awake alert in nad no distress eomi ncat cta b no added sound abd sot nt nd  No le edema Neuro intact but slow responses  Scheduled Meds: .  stroke: mapping our early stages of recovery book   Does not apply Once  . allopurinol  100 mg Oral Daily  . aspirin  81 mg Oral Daily  . atorvastatin  40 mg Oral Daily  . calcium acetate  667 mg Oral TID WC  . Chlorhexidine Gluconate Cloth  6 each Topical Q0600  . darbepoetin (ARANESP) injection - DIALYSIS  100 mcg Intravenous Q Tue-HD  . feeding supplement (PRO-STAT SUGAR FREE 64)  30 mL Oral BID  . metoCLOPramide  5 mg Oral TID WC  . metoprolol succinate  25 mg Oral Daily  . multivitamin  1 tablet Oral QHS  . pantoprazole  40 mg Oral Daily  . predniSONE  5 mg Oral Q breakfast  . sodium chloride flush  3 mL Intravenous Q12H   Continuous Infusions:    LOS: 21 days   Time spent: [place], MD [facility]  08/20/2019, 1:29 PM

## 2019-08-21 ENCOUNTER — Inpatient Hospital Stay (HOSPITAL_COMMUNITY): Payer: Medicare Other

## 2019-08-21 LAB — GLUCOSE, CAPILLARY
Glucose-Capillary: 134 mg/dL — ABNORMAL HIGH (ref 70–99)
Glucose-Capillary: 156 mg/dL — ABNORMAL HIGH (ref 70–99)
Glucose-Capillary: 161 mg/dL — ABNORMAL HIGH (ref 70–99)
Glucose-Capillary: 190 mg/dL — ABNORMAL HIGH (ref 70–99)
Glucose-Capillary: 237 mg/dL — ABNORMAL HIGH (ref 70–99)
Glucose-Capillary: 240 mg/dL — ABNORMAL HIGH (ref 70–99)
Glucose-Capillary: 262 mg/dL — ABNORMAL HIGH (ref 70–99)

## 2019-08-21 LAB — PROTIME-INR
INR: 1.4 — ABNORMAL HIGH (ref 0.8–1.2)
Prothrombin Time: 17.1 seconds — ABNORMAL HIGH (ref 11.4–15.2)

## 2019-08-21 LAB — HEPARIN LEVEL (UNFRACTIONATED): Heparin Unfractionated: 0.61 IU/mL (ref 0.30–0.70)

## 2019-08-21 MED ORDER — METOCLOPRAMIDE HCL 5 MG PO TABS
5.0000 mg | ORAL_TABLET | Freq: Two times a day (BID) | ORAL | Status: DC
  Administered 2019-08-21 – 2019-09-22 (×56): 5 mg via ORAL
  Filled 2019-08-21 (×58): qty 1

## 2019-08-21 MED ORDER — HEPARIN (PORCINE) 25000 UT/250ML-% IV SOLN
1450.0000 [IU]/h | INTRAVENOUS | Status: DC
  Administered 2019-08-21 – 2019-08-22 (×2): 1000 [IU]/h via INTRAVENOUS
  Administered 2019-08-23 – 2019-08-24 (×2): 950 [IU]/h via INTRAVENOUS
  Administered 2019-08-26: 1200 [IU]/h via INTRAVENOUS
  Filled 2019-08-21 (×6): qty 250

## 2019-08-21 MED ORDER — HEPARIN BOLUS VIA INFUSION
4000.0000 [IU] | Freq: Once | INTRAVENOUS | Status: AC
  Administered 2019-08-21: 4000 [IU] via INTRAVENOUS
  Filled 2019-08-21: qty 4000

## 2019-08-21 MED ORDER — METOPROLOL SUCCINATE ER 25 MG PO TB24
25.0000 mg | ORAL_TABLET | Freq: Every day | ORAL | Status: DC
  Administered 2019-08-22 – 2019-09-12 (×21): 25 mg via ORAL
  Filled 2019-08-21 (×22): qty 1

## 2019-08-21 MED ORDER — INSULIN ASPART 100 UNIT/ML ~~LOC~~ SOLN
2.0000 [IU] | Freq: Once | SUBCUTANEOUS | Status: AC
  Administered 2019-08-21: 2 [IU] via SUBCUTANEOUS

## 2019-08-21 NOTE — Progress Notes (Signed)
[facility] Progress Note   Subjective: Refusing to be examined. Has been irritable with staff. Will ask Dr. Jonnie Finner to examine pt.     Objective Vitals:   08/20/19 0954 08/20/19 2046 08/21/19 0438 08/21/19 0933  BP: (!) 143/75 120/72 129/79 136/75  Pulse: 76 86 94 89  Resp: 16 18 18 18   Temp: 97.9 F (36.6 C) 97.8 F (36.6 C) 98 F (36.7 C) 98.9 F (37.2 C)  TempSrc: Oral   Oral  SpO2:  97% 97% 96%  Weight:      Height:       Physical Exam General: Heart: Lungs: Abdomen: Extremities: Dialysis Access:     Additional Objective Labs: Basic Metabolic Panel: Recent Labs  Lab 08/18/19 0206 08/19/19 0838 08/20/19 0728  NA 132* 138 131*  K 5.5* 4.3 3.9  CL 95* 98 94*  CO2 22 30 28   GLUCOSE 182* 102* 184*  BUN 42* 29* 26*  CREATININE 8.25* 7.01* 5.56*  CALCIUM 8.5* 8.2* 7.6*  PHOS 2.7 2.7 2.6   Liver Function Tests: Recent Labs  Lab 08/18/19 0206 08/19/19 0838 08/20/19 0728  ALBUMIN 2.7* 2.5* 2.3*   No results for input(s): LIPASE, AMYLASE in the last 168 hours. CBC: Recent Labs  Lab 08/15/19 0929 08/17/19 1434 08/18/19 0206 08/19/19 0826 08/20/19 0728  WBC 6.0 7.5 9.1 6.4 5.7  HGB 8.8* 8.7* 9.2* 8.5* 8.0*  HCT 27.0* 27.5* 28.6* 26.2* 25.2*  MCV 104.2* 104.2* 103.6* 102.3* 102.9*  PLT 172 131* 144* 121* 126*   Blood Culture    Component Value Date/Time   SDES BLOOD SITE NOT SPECIFIED 07/30/2019 1843   SPECREQUEST  07/30/2019 1843    BOTTLES DRAWN AEROBIC AND ANAEROBIC Blood Culture results may not be optimal due to an inadequate volume of blood received in culture bottles   CULT  07/30/2019 1843    NO GROWTH 5 DAYS Performed at [facility], [place] [address] [place], [place] [address]    REPTSTATUS 08/04/2019 FINAL 07/30/2019 1843    Cardiac Enzymes: No results for input(s): CKTOTAL, CKMB, CKMBINDEX, TROPONINI in the last 168 hours. CBG: Recent Labs  Lab 08/20/19 1617 08/20/19 2045 08/21/19 0024 08/21/19 0438  08/21/19 0727  GLUCAP 261* 254* 240* 156* 134*   Iron Studies: No results for input(s): IRON, TIBC, TRANSFERRIN, FERRITIN in the last 72 hours. @lablastinr3 @ Studies/Results: No results found. Medications: . heparin 1,000 Units/hr (08/21/19 0641)   .  stroke: mapping our early stages of recovery book   Does not apply Once  . allopurinol  100 mg Oral Daily  . aspirin  81 mg Oral Daily  . atorvastatin  40 mg Oral Daily  . calcium acetate  667 mg Oral TID WC  . Chlorhexidine Gluconate Cloth  6 each Topical Q0600  . darbepoetin (ARANESP) injection - DIALYSIS  100 mcg Intravenous Q Tue-HD  . feeding supplement (PRO-STAT SUGAR FREE 64)  30 mL Oral BID  . metoCLOPramide  5 mg Oral TID WC  . metoprolol succinate  25 mg Oral Daily  . multivitamin  1 tablet Oral QHS  . pantoprazole  40 mg Oral Daily  . predniSONE  5 mg Oral Q breakfast  . sodium chloride flush  3 mL Intravenous Q12H     HD orders: T,Th,S G/O Back to [facility] when COVID 19 negative 4 hr 15 min 180NRE 400/800 80.5 kg 2.0 K/ 2.0 Ca R AVF/RIJ TDC -Heparin 8000 units IV TIW -Mircera 75 mcg IV q 2 weeks (last dose  07/25/19) -Venofer 50 mg IV weekly -Parsabiv 7.5 mg IV TIW   Assessment/Plan: 1.Acute COVID-19 pneumonia: s/p remdesivir and Decadron. As per primary team. Now out of 21 day isolation window 2. ESRD -HD per TTS schedule recently.HD 01/14 on schedule.  Still currently assigned to G/O. Will need two negative COVID tests prior to returning to [facility]. Using AVF here. Need to get [facility] out when possible.If he can use 15g needles with HD tomorrow will send to IR to remove TDC.  3. Anemia -HGB 8.0. ON weekly aranesp 100 mcg IV weekly. Missed dose last week. Given 08/19/19. 4. Secondary hyperparathyroidism -Phos is low 2.7. Decrease calcium acetate, hold renvela powder. Parsabiv not on hospital formulary. 5. HTN/volume -BP controlled. Volume status seems fine. UF as tolerated tomorrow. Well below OP EDW-lower wt on DC.   6. Nutrition -Albumin low. Dys 1 diet. Add prostat/renal vit.  7. Acute CVA-probably embolic. On ASA, Coumadin, lipitor.   Enslie Sahota H. Cheryllynn Sarff NP-C 08/21/2019, 11:04 AM  [employer] [phone]

## 2019-08-21 NOTE — Progress Notes (Signed)
Hospitalist progress note   Jonathan Dosser Sr. 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  Covid versus LV thrombus Continue aspirin Lipitor Coumadin Will need skilled facility placement Appreciate SLP input ESRD/HD Prior was on MWF-further management per them Need perm access--Apparently TDC L chest to be removed in am 1/14 LV thrombus Therapeutic on Coumadin--coniotnue Heparin Needs bridging back to coumadin after removal TDC scheduled for 1/14 Prior cadaveric renal transplant Continue prednisone 5---can probably d/c steroids--defer to trenal  Subjective:  Somewhat withdrawn today and not as talkative  Monosyllabic  Consultants:   renal Procedures:   n Antimicrobials:   n    Objective: Vitals:   08/20/19 0954 08/20/19 2046 08/21/19 0438 08/21/19 0933  BP: (!) 143/75 120/72 129/79 136/75  Pulse: 76 86 94 89  Resp: 16 18 18 18   Temp: 97.9 F (36.6 C) 97.8 F (36.6 C) 98 F (36.7 C) 98.9 F (37.2 C)  TempSrc: Oral   Oral  SpO2:  97% 97% 96%  Weight:      Height:        Intake/Output Summary (Last 24 hours) at 08/21/2019 1644 Last data filed at 08/21/2019 1100 Gross per 24 hour  Intake 783 ml  Output 0 ml  Net 783 ml   Filed Weights   08/19/19 1115 08/19/19 1400 08/20/19 0725  Weight: 72.1 kg 71 kg 71.8 kg    Examination:  Awake alert in nad  abd sot nt nd  No le edema Neuro intact but slow responses--monosyllabic and pacuity of vocab Neuro seems bilaterally intact to power  Scheduled Meds: .  stroke: mapping our early stages of recovery book   Does not apply Once  . allopurinol  100 mg Oral Daily  . aspirin  81 mg Oral Daily  . atorvastatin  40 mg Oral Daily  . calcium acetate  667 mg Oral TID WC  . Chlorhexidine Gluconate Cloth  6 each Topical Q0600  . darbepoetin (ARANESP) injection - DIALYSIS  100 mcg Intravenous Q Tue-HD  . feeding supplement (PRO-STAT SUGAR FREE 64)  30 mL Oral BID  . metoCLOPramide  5 mg Oral BID WC  . [START ON 08/22/2019] metoprolol succinate  25 mg Oral Daily  . multivitamin  1 tablet Oral QHS  . pantoprazole  40 mg Oral Daily  . predniSONE  5 mg Oral Q breakfast  . sodium chloride flush  3 mL Intravenous Q12H   Continuous Infusions: . heparin 1,000 Units/hr (08/21/19 0641)     LOS: 22 days   Time spent: [place], MD [facility]  08/21/2019, 4:44 PM

## 2019-08-21 NOTE — Progress Notes (Signed)
Modified Barium Swallow Progress Note  Patient Details  Name: Severus Aguiar Sr. MRN: [id number] Date of Birth: 06-02-1951  Today's Date: 08/21/2019  Modified Barium Swallow completed.  Full report located under Chart Review in the Imaging Section.  Brief recommendations include the following:  Clinical Impression  Pt demonstrates mild oropharyngeal dyspahgia with liquids pooling in the pyriform sinuses prior to the swallow. Pt consistenty took one sip at a time, and though swallow somewhat delayed, no penetration or aspiration occurred. Post swallow, no reside present though vocal quality audibly wet. Given that during bedside observation pt also consistently takes one sip at a time without cueing, recommend upgrade to thin liquids with mechanical soft diet given difficutly with self feeding with cognitive impairment. Will sign off for swallowing and proceed with cognitive linguistic per MD.    Swallow Evaluation Recommendations       SLP Diet Recommendations: Dysphagia 3 (Mech soft) solids;Thin liquid   Liquid Administration via: Cup;Straw   Medication Administration: Whole meds with puree   Supervision: Staff to assist with self feeding;Intermittent supervision to cue for compensatory strategies   Compensations: Slow rate;Small sips/bites   Postural Changes: Seated upright at 90 degrees   Oral Care Recommendations: Oral care BID        Emmalyn Hinson, Katherene Ponto 08/21/2019,2:26 PM

## 2019-08-21 NOTE — Progress Notes (Signed)
ANTICOAGULATION CONSULT NOTE - Follow Up Consult  Pharmacy Consult for Heparin Indication: h/o LV thrombus  No Known Allergies  Patient Measurements: Height: 5\' 11"  (180.3 cm) Weight: 158 lb 4.6 oz (71.8 kg) IBW/kg (Calculated) : 75.3 Heparin Dosing Weight: 71.8 kg  Vital Signs: Temp: 98 F (36.7 C) (01/13 0438) BP: 129/79 (01/13 0438) Pulse Rate: 94 (01/13 0438)  Labs: Recent Labs    08/19/19 0826 08/19/19 [id number] 08/20/19 0555 08/20/19 0728 08/21/19 0359  HGB 8.5*  --   --  8.0*  --   HCT 26.2*  --   --  25.2*  --   PLT 121*  --   --  126*  --   LABPROT 26.0*  --  23.4*  --  17.1*  INR 2.4*  --  2.1*  --  1.4*  CREATININE  --  7.01*  --  5.56*  --     Estimated Creatinine Clearance: 13.3 mL/min (A) (by C-G formula based on SCr of 5.56 mg/dL (H)).   Assessment: Anticoag: Apix per MD> heparin gtt for h/o LV thrombus > warfarin - INR 2.1>1.4, Hgb 8. Plt 126. No bleeding. Start IV heparin with plan for TDC.  Goal of Therapy:  Heparin level 0.3-0.7 units/ml Monitor platelets by anticoagulation protocol: Yes   Plan:  - Start IV heparin 4000 units, infusion 1000 units/hr. - Check in 6-8 hrs.  - Daily HL and CBC   Lashaunta Sicard S. Alford Highland, PharmD, BCPS Clinical Staff Pharmacist [email or web address] Alford Highland, Elleni Mozingo Stillinger 08/21/2019,5:29 AM

## 2019-08-21 NOTE — Progress Notes (Signed)
Occupational Therapy Treatment Patient Details Name: Jonathan Puri Sr. MRN: [id number] DOB: May 15, 1951 Today's Date: 08/21/2019    History of present illness 69 y.o. 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 Diagnosed with COVID 6 days prior to coming to ED, where chest x-ray showed multifocal pneumonia. Admitted 07/30/19 for treatment of acute encephalopathy, difficulty swallowing and pneumonia.MRI of the brain showed small acute infarcts in the left pons, left occipital lob and right frontal lobe in addition to chronic infarcts    OT comments  Patient supine in bed and agreeable to OT session.  Completing bed mobility with min guard assist for safety, sit to stand transfers with mod assist +2 for safety from EOB- then declining further engagement in mobility or self care tasks.  Continues to be limited by impaired cognition affecting ability to process, sequencing and problem solve through BADLs.  When patient decides to engage in activities, he can move well (scooting to [facility] with min assist).  Requires max assist to change gown today supine, as it was soiled.  Will follow acutely. SNF remains appropriate.    Follow Up Recommendations  SNF;Supervision/Assistance - 24 hour    Equipment Recommendations  None recommended by OT    Recommendations for Other Services      Precautions / Restrictions Precautions Precautions: Fall Restrictions Weight Bearing Restrictions: No       Mobility Bed Mobility Overal bed mobility: Needs Assistance Bed Mobility: Supine to Sit;Sit to Supine     Supine to sit: Min guard Sit to supine: Min guard   General bed mobility comments: increased effort to transition to/from EOB min guard for safety   Transfers Overall transfer level: Needs assistance Equipment used:  Rolling walker (2 wheeled) Transfers: Sit to/from Stand Sit to Stand: Mod assist;+2 physical assistance;+2 safety/equipment         General transfer comment: mod assist +2 to power up from EOB into standing, stood for <30 seconds then declined further mobility; able to scoot towards HOB with min assist and increased time     Balance Overall balance assessment: Needs assistance Sitting-balance support: No upper extremity supported;Feet supported Sitting balance-Leahy Scale: Fair Sitting balance - Comments: min guard to close supervision seated EOB    Standing balance support: Bilateral upper extremity supported;During functional activity Standing balance-Leahy Scale: Poor Standing balance comment: reliant on external support                           ADL either performed or assessed with clinical judgement   ADL Overall ADL's : Needs assistance/impaired                 Upper Body Dressing : Maximal assistance;Bed level Upper Body Dressing Details (indicate cue type and reason): to change gown                  Functional mobility during ADLs: Moderate assistance;+2 for safety/equipment General ADL Comments: pt limited by cognition (processing, safety, problem solving) limiting participation in BADLs--declined futher ADL engagement      Vision       Perception     Praxis      Cognition Arousal/Alertness: Awake/alert Behavior During Therapy: Flat affect Overall Cognitive Status: Impaired/Different from baseline Area of Impairment: Memory;Safety/judgement;Awareness;Following commands;Problem solving;Attention  Current Attention Level: Selective Memory: Decreased short-term memory Following Commands: Follows one step commands inconsistently;Follows one step commands with increased time Safety/Judgement: Decreased awareness of safety;Decreased awareness of deficits Awareness: Intellectual Problem Solving: Slow  processing;Requires verbal cues;Decreased initiation;Difficulty sequencing;Requires tactile cues General Comments: patient following commands intermittently, he initally agrees to engagement but then once EOB declines participation; multimodal cueing required with poor awareness and problem solving         Exercises     Shoulder Instructions       General Comments      Pertinent Vitals/ Pain       Pain Assessment: No/denies pain  Home Living                                          Prior Functioning/Environment              Frequency  Min 2X/week        Progress Toward Goals  OT Goals(current goals can now be found in the care plan section)  Progress towards OT goals: Progressing toward goals  Acute Rehab OT Goals Patient Stated Goal: none stated  Plan Discharge plan remains appropriate;Frequency remains appropriate    Co-evaluation                 AM-PAC OT "6 Clicks" Daily Activity     Outcome Measure   Help from another person eating meals?: A Little Help from another person taking care of personal grooming?: A Little Help from another person toileting, which includes using toliet, bedpan, or urinal?: A Lot Help from another person bathing (including washing, rinsing, drying)?: A Lot Help from another person to put on and taking off regular upper body clothing?: A Lot Help from another person to put on and taking off regular lower body clothing?: A Lot 6 Click Score: 14    End of Session Equipment Utilized During Treatment: Rolling walker;Gait belt  OT Visit Diagnosis: Unsteadiness on feet (R26.81);Other symptoms and signs involving cognitive function;Other abnormalities of gait and mobility (R26.89)   Activity Tolerance Other (comment)(limited by participation, declining to engage)   Patient Left in bed;with call bell/phone within reach;with bed alarm set   Nurse Communication Mobility status        Time: [id number] OT  Time Calculation (min): 17 min  Charges: OT General Charges $OT Visit: 1 Visit OT Treatments $Self Care/Home Management : 8-22 mins  Jolaine Artist, OT [place] Pager [phone] Office [phone]    Delight Stare 08/21/2019, 12:56 PM

## 2019-08-21 NOTE — TOC Progression Note (Addendum)
Transition of Care (TOC) - Progression Note    Patient Details  Name: Jonathan Salb Sr. MRN: [id number] Date of Birth: 02-12-1951  Transition of Care [facility]) CM/SW Contact  Sharlet Salina Mila Homer, LCSW Phone Number: 08/21/2019, 1:50 PM  Clinical Narrative:  Continuing to work on placement for patient. Contacted Trudy at [facility] in [place] [phone] (as prior SW had made contact with [facility] and faxed clinicals), and message left. Contact made with Shantelle at [place] and message left. Contacted Irine Seal, admissions director at [facility] regarding patient and she needs information regarding patient's dialysis schedule. Consulted with Terri Piedra, dialysis coordinator and she is working on getting patient set-up at another HD Center, as he no longer needs to go to [facility] for HD (not COVID positive).    2:08 pm: Text received from Eva, with [facility] that she can't offer on patient. 2:15 pm: Talked with Bryson Ha, admissions director at [facility] and no bed availability at this time. CSW also informed that [facility] currently has a COVID outbreak and is not accepting new patients.  2:32 pm: Call made to [facility] admissions and message left.   Expected Discharge Plan: [place]    Expected Discharge Plan and Services Expected Discharge Plan: [place] arrangements for the past 2 months: Single Family Home                                     Social Determinants of Health (SDOH) Interventions  No SDOH interventions needed at this time  Readmission Risk Interventions No flowsheet data found.

## 2019-08-21 NOTE — Progress Notes (Signed)
ANTICOAGULATION CONSULT NOTE - Follow Up Consult  Pharmacy Consult for Heparin Indication: h/o LV thrombus  No Known Allergies  Patient Measurements: Height: 5\' 11"  (180.3 cm) Weight: 158 lb 4.6 oz (71.8 kg) IBW/kg (Calculated) : 75.3 Heparin Dosing Weight: 71.8 kg  Vital Signs: Temp: 98.9 F (37.2 C) (01/13 0933) Temp Source: Oral (01/13 0933) BP: 136/75 (01/13 0933) Pulse Rate: 89 (01/13 0933)  Labs: Recent Labs    08/19/19 0826 08/19/19 [id number] 08/20/19 0555 08/20/19 0728 08/21/19 0359 08/21/19 1443  HGB 8.5*  --   --  8.0*  --   --   HCT 26.2*  --   --  25.2*  --   --   PLT 121*  --   --  126*  --   --   LABPROT 26.0*  --  23.4*  --  17.1*  --   INR 2.4*  --  2.1*  --  1.4*  --   HEPARINUNFRC  --   --   --   --   --  0.61  CREATININE  --  7.01*  --  5.56*  --   --     Estimated Creatinine Clearance: 13.3 mL/min (A) (by C-G formula based on SCr of 5.56 mg/dL (H)).   Assessment: Anticoag: Apix per MD> heparin gtt for h/o LV thrombus > warfarin - INR 2.1>1.4, Hgb 8. Plt 126. No bleeding. Start IV heparin with plan for TDC.  Heparin level 0.61  Goal of Therapy:  Heparin level 0.3-0.7 units/ml Monitor platelets by anticoagulation protocol: Yes   Plan:  Continue heparin at 1000 units / hr Daily HL and CBC  Thank you Anette Guarneri, PharmD  08/21/2019,3:44 PM

## 2019-08-21 NOTE — Plan of Care (Signed)
  Problem: Self-Care: Goal: Ability to participate in self-care as condition permits will improve Outcome: Progressing   Problem: Nutrition: Goal: Risk of aspiration will decrease Outcome: Progressing   Problem: Nutrition: Goal: Adequate nutrition will be maintained Outcome: Progressing

## 2019-08-22 LAB — RENAL FUNCTION PANEL
Albumin: 2.6 g/dL — ABNORMAL LOW (ref 3.5–5.0)
Anion gap: 12 (ref 5–15)
BUN: 50 mg/dL — ABNORMAL HIGH (ref 8–23)
CO2: 25 mmol/L (ref 22–32)
Calcium: 8 mg/dL — ABNORMAL LOW (ref 8.9–10.3)
Chloride: 95 mmol/L — ABNORMAL LOW (ref 98–111)
Creatinine, Ser: 7.33 mg/dL — ABNORMAL HIGH (ref 0.61–1.24)
GFR calc Af Amer: 8 mL/min — ABNORMAL LOW (ref >60)
GFR calc non Af Amer: 7 mL/min — ABNORMAL LOW (ref >60)
Glucose, Bld: 188 mg/dL — ABNORMAL HIGH (ref 70–99)
Phosphorus: 1.2 mg/dL — ABNORMAL LOW (ref 2.5–4.6)
Potassium: 3.9 mmol/L (ref 3.5–5.1)
Sodium: 132 mmol/L — ABNORMAL LOW (ref 135–145)

## 2019-08-22 LAB — SARS CORONAVIRUS 2 (TAT 6-24 HRS): SARS Coronavirus 2: POSITIVE — AB

## 2019-08-22 LAB — PROTIME-INR
INR: 1.1 (ref 0.8–1.2)
Prothrombin Time: 14.3 seconds (ref 11.4–15.2)

## 2019-08-22 LAB — HEPARIN LEVEL (UNFRACTIONATED)
Heparin Unfractionated: 0.63 IU/mL (ref 0.30–0.70)
Heparin Unfractionated: 0.47 IU/mL (ref 0.30–0.70)

## 2019-08-22 LAB — GLUCOSE, CAPILLARY
Glucose-Capillary: 170 mg/dL — ABNORMAL HIGH (ref 70–99)
Glucose-Capillary: 126 mg/dL — ABNORMAL HIGH (ref 70–99)
Glucose-Capillary: 116 mg/dL — ABNORMAL HIGH (ref 70–99)
Glucose-Capillary: 203 mg/dL — ABNORMAL HIGH (ref 70–99)
Glucose-Capillary: 239 mg/dL — ABNORMAL HIGH (ref 70–99)

## 2019-08-22 LAB — CBC
HCT: 26.5 % — ABNORMAL LOW (ref 39.0–52.0)
Hemoglobin: 8.6 g/dL — ABNORMAL LOW (ref 13.0–17.0)
MCH: 33.5 pg (ref 26.0–34.0)
MCHC: 32.5 g/dL (ref 30.0–36.0)
MCV: 103.1 fL — ABNORMAL HIGH (ref 80.0–100.0)
Platelets: 147 10*3/uL — ABNORMAL LOW (ref 150–400)
RBC: 2.57 MIL/uL — ABNORMAL LOW (ref 4.22–5.81)
RDW: 15.3 % (ref 11.5–15.5)
WBC: 8.2 10*3/uL (ref 4.0–10.5)
nRBC: 0 % (ref 0.0–0.2)

## 2019-08-22 LAB — PHOSPHORUS: Phosphorus: 1.4 mg/dL — ABNORMAL LOW (ref 2.5–4.6)

## 2019-08-22 MED ORDER — SODIUM CHLORIDE 0.9 % IV SOLN: 100.0000 mL | INTRAVENOUS | Status: DC | PRN

## 2019-08-22 MED ORDER — HEPARIN SODIUM (PORCINE) 1000 UNIT/ML IJ SOLN
INTRAMUSCULAR | Status: AC
  Administered 2019-08-22: 3200 [IU] via INTRAVENOUS_CENTRAL
  Filled 2019-08-22: qty 4

## 2019-08-22 MED ORDER — LIDOCAINE-PRILOCAINE 2.5-2.5 % EX CREA: 1.0000 "application " | TOPICAL_CREAM | CUTANEOUS | Status: DC | PRN

## 2019-08-22 MED ORDER — ALTEPLASE 2 MG IJ SOLR: 2.0000 mg | Freq: Once | INTRAMUSCULAR | Status: DC | PRN

## 2019-08-22 MED ORDER — WARFARIN - PHARMACIST DOSING INPATIENT: Freq: Every day | Status: DC

## 2019-08-22 MED ORDER — WARFARIN SODIUM 2.5 MG PO TABS
2.5000 mg | ORAL_TABLET | Freq: Once | ORAL | Status: AC
  Administered 2019-08-22: 2.5 mg via ORAL
  Filled 2019-08-22: qty 1

## 2019-08-22 MED ORDER — LIDOCAINE HCL (PF) 1 % IJ SOLN: 5.0000 mL | INTRAMUSCULAR | Status: DC | PRN

## 2019-08-22 MED ORDER — PENTAFLUOROPROP-TETRAFLUOROETH EX AERO: 1.0000 "application " | INHALATION_SPRAY | CUTANEOUS | Status: DC | PRN

## 2019-08-22 MED ORDER — K PHOS MONO-SOD PHOS DI & MONO 155-852-130 MG PO TABS
500.0000 mg | ORAL_TABLET | Freq: Three times a day (TID) | ORAL | Status: DC
  Administered 2019-08-22: 500 mg via ORAL
  Filled 2019-08-22 (×3): qty 2

## 2019-08-22 MED ORDER — HEPARIN SODIUM (PORCINE) 1000 UNIT/ML DIALYSIS: 1000.0000 [IU] | INTRAMUSCULAR | Status: DC | PRN

## 2019-08-22 MED ORDER — SODIUM PHOSPHATES 45 MMOLE/15ML IV SOLN
10.0000 mmol | Freq: Once | INTRAVENOUS | Status: AC
  Administered 2019-08-23: 10 mmol via INTRAVENOUS
  Filled 2019-08-22: qty 3.33

## 2019-08-22 NOTE — Evaluation (Addendum)
Speech Language Pathology Evaluation Patient Details Name: Jonathan Biba Sr. MRN: [id number] DOB: 19-May-1951 Today's Date: 08/22/2019 Time: [id number] SLP Time Calculation (min) (ACUTE ONLY): 11 min  Problem List:  Patient Active Problem List   Diagnosis Date Noted  . Cerebral thrombosis with cerebral infarction 08/02/2019  . Cerebral embolism with cerebral infarction 08/02/2019  . Acute respiratory failure due to COVID-19 ([place]) 07/30/2019  . Acute respiratory failure with hypoxia ([place]) 07/30/2019  . Chronic systolic HF (heart failure) ([place]) 02/25/2019  . ESRD (end stage renal disease) on dialysis ([place]) 02/25/2019  . Generalized abdominal pain 07/16/2018  . Arteriovenous fistula for hemodialysis in place, primary ([place]) 05/15/2018  . 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 Acute blood loss anemia 12/14/2017  . Hypomagnesemia 12/03/2017  . Anticoagulated 12/02/2017  . Stroke ([place]) 11/29/2017  . 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 History of MI (myocardial infarction) 11/28/2017  . Hypertension 11/28/2017  . Chronic hepatitis C without hepatic coma ([place]) 09/25/2017  . Diabetes mellitus type 2, uncomplicated ([place]) [id number]  . Myocardial infarction ([place]) 09/25/2017  . Renal transplant recipient 09/25/2017  . Transfusion history 09/25/2017  . History of coronary artery disease 09/12/2016  . 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 MI ([place]) 09/12/2016  . Aftercare following organ transplant 09/11/2016  . 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 At risk for infection transmitted from donor 06/16/2014  . Ureteral stent displacement ([place]) 06/10/2014  . Prophylactic antibiotic 06/09/2014  . Immunosuppression ([place]) 06/06/2014   Past Medical History:  Past Medical History:  Diagnosis Date  . Diabetes mellitus without complication ([place])   . Hypertension   . Renal disorder 2015   right kidney transplant  . Stroke [facility])     Past Surgical History:  Past Surgical History:  Procedure Laterality Date  . BACK SURGERY     Has had 2 back surgeries  . Depression    . HAND SURGERY Right   . HAND SURGERY    . KIDNEY TRANSPLANT     November 2015  . Memory loss    . NEPHRECTOMY TRANSPLANTED ORGAN     HPI:  Jonathan Surace Sr. is a 69 y.o. male with history of ESRD on hemodialysis, LV thrombus, renal transplant, stroke, hypertension diabetes mellitus was having increasing difficulty swallowing over the last 2 weeks as noticed by patient's wife (pocketing his food. Per chart 6 days ago diagnosed with Covid at the time he was not symptomatic. Now more short of breath and lethargic and confused. 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    Assessment / Plan / Recommendation Clinical Impression  Pt's cognitive abilities are similar from assessment 12/25/220. Compared to prior documentation improvements noted in speech intelligibility and fluency. Responses are vague, states "yeah" to most questions. He was not oriented to time or situation and struggled with accuracy of biographical information. Decreased safety awareness due to attempting to get up and required cueing to call bell. He was unable to state deficits from stroke. Pt will need 24 hour supervision. ST will see while on acute care.       SLP Assessment  SLP Recommendation/Assessment: Patient needs continued Speech Lanaguage Pathology Services SLP Visit Diagnosis: Cognitive communication deficit (R41.841)    Follow Up Recommendations  24 hour supervision/assistance    Frequency and Duration min 2x/week  2 weeks  SLP Evaluation Cognition  Overall Cognitive Status: Impaired/Different from baseline Arousal/Alertness: Awake/alert Orientation Level: Oriented to person;Disoriented to time;Oriented to place;Disoriented to  situation Attention: Sustained Sustained Attention: Appears intact Memory: Impaired Memory Impairment: Retrieval deficit;Decreased recall of new information Awareness: Impaired Awareness Impairment: Intellectual impairment;Emergent impairment Problem Solving: Impaired Problem Solving Impairment: Verbal basic;Functional basic Safety/Judgment: Impaired       Comprehension  Auditory Comprehension Overall Auditory Comprehension: Impaired Basic Biographical Questions: 51-75% accurate Reading Comprehension Reading Status: Not tested    Expression Expression Primary Mode of Expression: Verbal Verbal Expression Overall Verbal Expression: Appears within functional limits for tasks assessed Pragmatics: Impairment Written Expression Dominant Hand: Right Written Expression: Not tested   Oral / Motor  Oral Motor/Sensory Function Overall Oral Motor/Sensory Function: Within functional limits Motor Speech Overall Motor Speech: Appears within functional limits for tasks assessed Respiration: Within functional limits Phonation: Normal Resonance: Within functional limits Articulation: Within functional limitis Intelligibility: Intelligible Motor Planning: Witnin functional limits   GO                    Houston Siren 08/22/2019, 3:38 PM  Orbie Pyo Aristea Posada M.Ed Risk analyst [phone] Office [phone]

## 2019-08-22 NOTE — TOC Progression Note (Addendum)
Transition of Care (TOC) - Progression Note    Patient Details  Name: Jonathan Grumbine Sr. MRN: [id number] Date of Birth: 11-29-1950  Transition of Care [facility]) CM/SW Contact  Sharlet Salina Mila Homer, LCSW Phone Number: 08/22/2019, 12:13 PM  Clinical Narrative: Call made to Cleon Dew, admissions director with [facility] (12:05 pm) regarding patient. Gerald Stabs informed that patient tested COVID positive on 07/08/20 and another COVID test is pending. Per [facility] not accepting new patients due to [place] outbreak, however he will review patient's clinicals to determine if [place] can accept. Clinicals sent to [facility]. Call made to [facility], admissions director at [facility] in [place] (12:24 pm) and they are not in network with [employer].    Talked with wife (12:30 pm) to provide an update regarding the facility search for her husband. Mrs. Mcquain asked about [facility] and was informed that they declined her husband and the reason. Mrs. Tabacco commented that her husband needs rehab and reported that her son and daughter-in-law live with them, they have a 2-story home, everyone works, her work schedule is 7 pm - 7 am, and CSW expressed understanding.   Mrs. Weidert reported that her son and daughter-in-law tested positive for COVID, then her husband, however she has been tested and was negative. Mrs. Studzinski advised that she will be kept informed regarding the facility search.       Expected Discharge Plan: [place]    Expected Discharge Plan and Services Expected Discharge Plan: [place] arrangements for the past 2 months: Single Family Home                                    Social Determinants of Health (SDOH) Interventions  No SDOH interventions needed at this time.  Readmission Risk Interventions No flowsheet data found.

## 2019-08-22 NOTE — Progress Notes (Signed)
ANTICOAGULATION CONSULT NOTE - Follow Up Consult  Pharmacy Consult for Heparin Indication: h/o LV thrombus  No Known Allergies  Patient Measurements: Height: 5\' 11"  (180.3 cm) Weight: 155 lb 10.3 oz (70.6 kg) IBW/kg (Calculated) : 75.3 Heparin Dosing Weight: 71.8 kg  Vital Signs: Temp: 98 F (36.7 C) (01/14 1106) Temp Source: Oral (01/14 1106) BP: 122/56 (01/14 1106) Pulse Rate: 83 (01/14 1106)  Labs: Recent Labs    08/20/19 0555 08/20/19 0728 08/21/19 0359 08/21/19 1443 08/22/19 0948 08/22/19 0949 08/22/19 1016  HGB  --  8.0*  --   --   --  8.6*  --   HCT  --  25.2*  --   --   --  26.5*  --   PLT  --  126*  --   --   --  147*  --   LABPROT 23.4*  --  17.1*  --   --   --  14.3  INR 2.1*  --  1.4*  --   --   --  1.1  HEPARINUNFRC  --   --   --  0.61  --   --  0.63  CREATININE  --  5.56*  --   --  7.33*  --   --     Estimated Creatinine Clearance: 9.9 mL/min (A) (by C-G formula based on SCr of 7.33 mg/dL (H)).   Assessment: Pt is a 69 year old male with history of renal transplant in 05/2014 and now has ESRD on HD TTS. Pt has been on Eliquis for left ventricular thombus which has been switched to warfarin. Patient was placed on heparin to bridge the patient so that the [facility] can be removed. However, patient's AVF was not able to be cannulated on 1/14, therefore plans have changed to keep the [facility] in for now. Pharmacy has been consulted to resume coumadin dosing and dose heparin. Of note, patient has also had a recent stroke.   Anticoag:   Heparin level is therapeutic at 0.63 on heparin rate of 1,000 units/hr. INR is subtherapeutic at 1.1 after no warfarin for last two days. Plts are low but trending up at 147. Hg/Hct are stable and no signs/symptoms of bleeding or issues with heparin infusion per RN.  Goal of Therapy:  Heparin level 0.3-0.5 units/ml due to recent CVA Monitor platelets by anticoagulation protocol: Yes   Plan:  Decrease heparin rate to 950 units /  hr Warfarin 2.5 mg x 1 tonight Daily INR Daily HL and CBC Monitor for signs/symptoms of bleeding  Sherren Kerns, PharmD PGY1 Acute Care Pharmacy Resident  08/22/2019,12:47 PM

## 2019-08-22 NOTE — Progress Notes (Signed)
Patient was retested for COVID yesterday at the request of OP HD clinic, as patient's home clinic/[facility] Director will only accept patient back once he has two negative tests. Patient's test resulted today as positive still, and therefore, he cannot return to his home clinic/[facility]. He will treat at [place] isolation shift at discharge, TTS 12:00pm and he will continue to be retested there. Renal Navigator notified CSW/V. Crawford, as she is searching for a SNF bed for patient.  Alphonzo Cruise, [place] Renal Navigator [phone]

## 2019-08-22 NOTE — Progress Notes (Signed)
PT Cancellation Note  Patient Details Name: Jonathan Keath Sr. MRN: [id number] DOB: 1950-08-10   Cancelled Treatment:    Reason Eval/Treat Not Completed: Patient at procedure or test/unavailable. Patient is in HD. Will re-attempt later if time allows.   Zaria Taha 08/22/2019, 9:20 AM

## 2019-08-22 NOTE — Progress Notes (Signed)
[facility] Progress Note   Subjective: Seen on HD. Talking to me today, cooperative. No complaints. Unfortunately unable to cannulate AVF with 15g needles-using TDC    Objective Vitals:   08/22/19 0811 08/22/19 0831 08/22/19 0900 08/22/19 0930  BP: (!) 156/84 (!) 144/79 115/71 114/66  Pulse: 91 90 89 83  Resp: 18     Temp: 97.9 F (36.6 C)     TempSrc: Oral     SpO2: 97%     Weight: 72.2 kg     Height:       Physical Exam General:Chronically ill appearing male in NAD Heart:S1,S2 RRR no M/R/G Lungs:CTAB A/P Abdomen:S, NT active BS Extremities:No LE edema Dialysis Access:R AVF +T/B. RIJ TDC drsg CDI blood lines connected.  Additional Objective Labs: Basic Metabolic Panel: Recent Labs  Lab 08/18/19 0206 08/19/19 0838 08/20/19 0728  NA 132* 138 131*  K 5.5* 4.3 3.9  CL 95* 98 94*  CO2 22 30 28   GLUCOSE 182* 102* 184*  BUN 42* 29* 26*  CREATININE 8.25* 7.01* 5.56*  CALCIUM 8.5* 8.2* 7.6*  PHOS 2.7 2.7 2.6   Liver Function Tests: Recent Labs  Lab 08/18/19 0206 08/19/19 0838 08/20/19 0728  ALBUMIN 2.7* 2.5* 2.3*   No results for input(s): LIPASE, AMYLASE in the last 168 hours. CBC: Recent Labs  Lab 08/17/19 1434 08/17/19 1434 08/18/19 0206 08/19/19 0826 08/20/19 0728  WBC 7.5   < > 9.1 6.4 5.7  HGB 8.7*   < > 9.2* 8.5* 8.0*  HCT 27.5*   < > 28.6* 26.2* 25.2*  MCV 104.2*  --  103.6* 102.3* 102.9*  PLT 131*   < > 144* 121* 126*   < > = values in this interval not displayed.   Blood Culture    Component Value Date/Time   SDES BLOOD SITE NOT SPECIFIED 07/30/2019 1843   SPECREQUEST  07/30/2019 1843    BOTTLES DRAWN AEROBIC AND ANAEROBIC Blood Culture results may not be optimal due to an inadequate volume of blood received in culture bottles   CULT  07/30/2019 1843    NO GROWTH 5 DAYS Performed at [facility], [place] [address], [place], [name] [address]    REPTSTATUS 08/04/2019 FINAL 07/30/2019 1843    Cardiac  Enzymes: No results for input(s): CKTOTAL, CKMB, CKMBINDEX, TROPONINI in the last 168 hours. CBG: Recent Labs  Lab 08/21/19 1711 08/21/19 1950 08/21/19 2356 08/22/19 0404 08/22/19 0721  GLUCAP 190* 262* 237* 170* 126*   Iron Studies: No results for input(s): IRON, TIBC, TRANSFERRIN, FERRITIN in the last 72 hours. @lablastinr3 @ Studies/Results: DG Swallowing Func-Speech Pathology  Result Date: 08/21/2019 Objective Swallowing Evaluation: Type of Study: MBS-Modified Barium Swallow Study  Patient Details Name: Jonathan Patry Sr. MRN: [id number] Date of Birth: 14-Jan-1951 Today's Date: 08/21/2019 Time: SLP Start Time (ACUTE ONLY): 1335 -SLP Stop Time (ACUTE ONLY): 1355 SLP Time Calculation (min) (ACUTE ONLY): 20 min Past Medical History: Past Medical History: Diagnosis Date . Diabetes mellitus without complication ([place])  . Hypertension  . Renal disorder 2015  right kidney transplant . Stroke [facility])  Past Surgical History: Past Surgical History: Procedure Laterality Date . BACK SURGERY    Has had 2 back surgeries . Depression   . HAND SURGERY Right  . HAND SURGERY   . KIDNEY TRANSPLANT    November 2015 . Memory loss   . NEPHRECTOMY TRANSPLANTED ORGAN   HPI: Jonathan Miggins Sr. is a 69 y.o. male with history of ESRD on hemodialysis, LV thrombus,  renal transplant, stroke, hypertension diabetes mellitus was having increasing difficulty swallowing over the last 2 weeks as noticed by patient's wife (pocketing his food. Per chart 6 days ago diagnosed with Covid at the time he was not symptomatic. Now more short of breath and lethargic and confused. 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 Chest x-ray showing multifocal pneumonia.  Subjective: alert Assessment / Plan / Recommendation CHL IP CLINICAL IMPRESSIONS 08/21/2019 Clinical Impression Pt demonstrates mild oropharyngeal dyspahgia  with liquids pooling in the pyriform sinuses prior to the swallow. Pt consistenty took one sip at a time, and though swallow somewhat delayed, no penetration or aspiration occurred. Post swallow, no reside present though vocal quality audibly wet. Given that during bedside observation pt also consistently takes one sip at a time without cueing, recommend upgrade to thin liquids with mechanical soft diet given difficutly with self feeding with cognitive impairment. Will sign off for swallowing and proceed with cognitive linguistic per MD.  SLP Visit Diagnosis Dysphagia, oropharyngeal phase (R13.12) Attention and concentration deficit following -- Frontal lobe and executive function deficit following -- Impact on safety and function Mild aspiration risk   CHL IP TREATMENT RECOMMENDATION 08/21/2019 Treatment Recommendations No treatment recommended at this time   Prognosis 08/01/2019 Prognosis for Safe Diet Advancement Good Barriers to Reach Goals Cognitive deficits Barriers/Prognosis Comment -- CHL IP DIET RECOMMENDATION 08/21/2019 SLP Diet Recommendations Dysphagia 3 (Mech soft) solids;Thin liquid Liquid Administration via Cup;Straw Medication Administration Whole meds with puree Compensations Slow rate;Small sips/bites Postural Changes Seated upright at 90 degrees   CHL IP OTHER RECOMMENDATIONS 08/21/2019 Recommended Consults -- Oral Care Recommendations Oral care BID Other Recommendations --   CHL IP FOLLOW UP RECOMMENDATIONS 08/19/2019 Follow up Recommendations Skilled Nursing facility   [facility] IP FREQUENCY AND DURATION 08/02/2019 Speech Therapy Frequency (ACUTE ONLY) min 2x/week Treatment Duration --      CHL IP ORAL PHASE 08/21/2019 Oral Phase WFL Oral - Pudding Teaspoon -- Oral - Pudding Cup -- Oral - Honey Teaspoon -- Oral - Honey Cup -- Oral - Nectar Teaspoon -- Oral - Nectar Cup -- Oral - Nectar Straw -- Oral - Thin Teaspoon -- Oral - Thin Cup -- Oral - Thin Straw -- Oral - Puree -- Oral - Mech Soft -- Oral -  Regular -- Oral - Multi-Consistency -- Oral - Pill -- Oral Phase - Comment --  CHL IP PHARYNGEAL PHASE 08/21/2019 Pharyngeal Phase Impaired Pharyngeal- Pudding Teaspoon -- Pharyngeal -- Pharyngeal- Pudding Cup -- Pharyngeal -- Pharyngeal- Honey Teaspoon -- Pharyngeal -- Pharyngeal- Honey Cup -- Pharyngeal -- Pharyngeal- Nectar Teaspoon -- Pharyngeal -- Pharyngeal- Nectar Cup -- Pharyngeal -- Pharyngeal- Nectar Straw -- Pharyngeal -- Pharyngeal- Thin Teaspoon -- Pharyngeal -- Pharyngeal- Thin Cup Delayed swallow initiation-pyriform sinuses Pharyngeal -- Pharyngeal- Thin Straw Delayed swallow initiation-pyriform sinuses Pharyngeal -- Pharyngeal- Puree Delayed swallow initiation-vallecula Pharyngeal -- Pharyngeal- Mechanical Soft -- Pharyngeal -- Pharyngeal- Regular Delayed swallow initiation-vallecula Pharyngeal -- Pharyngeal- Multi-consistency -- Pharyngeal -- Pharyngeal- Pill -- Pharyngeal -- Pharyngeal Comment --  No flowsheet data found. Herbie Baltimore, MA CCC-SLP Acute Rehabilitation Services Pager [phone] Office [phone] Lynann Beaver 08/21/2019, 2:28 PM              Medications: . sodium chloride    . sodium chloride    . heparin 1,000 Units/hr (08/22/19 0200)   .  stroke: mapping our early stages of recovery book  Does not apply Once  . allopurinol  100 mg Oral Daily  . aspirin  81 mg Oral Daily  . atorvastatin  40 mg Oral Daily  . calcium acetate  667 mg Oral TID WC  . Chlorhexidine Gluconate Cloth  6 each Topical Q0600  . darbepoetin (ARANESP) injection - DIALYSIS  100 mcg Intravenous Q Tue-HD  . feeding supplement (PRO-STAT SUGAR FREE 64)  30 mL Oral BID  . metoCLOPramide  5 mg Oral BID WC  . metoprolol succinate  25 mg Oral Daily  . multivitamin  1 tablet Oral QHS  . pantoprazole  40 mg Oral Daily  . predniSONE  5 mg Oral Q breakfast  . sodium chloride flush  3 mL Intravenous Q12H     HD orders: T,Th,S G/O Back to [facility] when COVID 19 negative 4 hr 15 min 180NRE  400/800 80.5 kg 2.0 K/ 2.0 Ca R AVF/RIJ TDC -Heparin 8000 units IV TIW -Mircera 75 mcg IV q 2 weeks (last dose 07/25/19) -Venofer 50 mg IV weekly -Parsabiv 7.5 mg IV TIW   Assessment/Plan: 1.Acute COVID-19 pneumonia: s/p remdesivir and Decadron. As per primary team. Now out of 21 day isolation window but will still be required to have two negative COVID 19 tests before he can return to [facility].  2. ESRD -HD per TTS schedule recently.HD today on schedule. Still currently assigned to G/O. Will need two negative COVID tests prior to returning to [facility]. Had been using AVF here with 16g needles however was not able to cannulate today with 15g. Using [facility]. Will get [facility] out when he can successfully be cannulated with 15g needles.  3. Anemia -HGB 8.0 01/13. Labs pending today.  ON weekly aranesp 100 mcg IV weekly. Missed dose last week. Given 08/20/19. 4. Secondary hyperparathyroidism -Phos is low 2.7. Decrease calcium acetate, hold renvela powder. Parsabiv not on hospital formulary.Labs pending.  5. HTN/volume -Volume status seems fine. BP reasonably controlled.  UFG 2.0 liters today.  6. Nutrition -Albumin low. Dys 3 diet. Add prostat/renal vit.  7. Acute CVA-probably embolic. On ASA, Coumadin, lipitor.    Disposition: Awaiting SNF placement.   Melessa Cowell H. Israel Wunder NP-C 08/22/2019, 9:49 AM  [employer] [phone]

## 2019-08-22 NOTE — Progress Notes (Signed)
ANTICOAGULATION CONSULT NOTE - Follow Up Consult  Pharmacy Consult for Heparin Indication: h/o LV thrombus  No Known Allergies  Patient Measurements: Height: 5\' 11"  (180.3 cm) Weight: 155 lb 10.3 oz (70.6 kg) IBW/kg (Calculated) : 75.3 Heparin Dosing Weight: 71.8 kg  Vital Signs: Temp: 98.2 F (36.8 C) (01/14 1500) Temp Source: Oral (01/14 1106) BP: 133/59 (01/14 1500) Pulse Rate: 90 (01/14 1500)  Labs: Recent Labs    08/20/19 0555 08/20/19 0728 08/21/19 0359 08/21/19 1443 08/22/19 0948 08/22/19 0949 08/22/19 1016 08/22/19 2126  HGB  --  8.0*  --   --   --  8.6*  --   --   HCT  --  25.2*  --   --   --  26.5*  --   --   PLT  --  126*  --   --   --  147*  --   --   LABPROT 23.4*  --  17.1*  --   --   --  14.3  --   INR 2.1*  --  1.4*  --   --   --  1.1  --   HEPARINUNFRC  --   --   --  0.61  --   --  0.63 0.47  CREATININE  --  5.56*  --   --  7.33*  --   --   --     Estimated Creatinine Clearance: 9.9 mL/min (A) (by C-G formula based on SCr of 7.33 mg/dL (H)).   Assessment: Pt is a 69 year old male with history of renal transplant in 05/2014 and now with ESRD on HD TTS. Pt has been on Eliquis for left ventricular thombus, which has been switched to warfarin. Patient was placed on heparin bridge so that the [facility] can be removed. However, patient's AVF was not able to be cannulated on 1/14; therefore, plans have changed to keep the [facility] in for now. Pharmacy has been consulted to resume warfarin dosing and dose heparin. Of note, patient has also had a recent stroke.  Heparin level ~8 hrs after heparin infusion was decreased to 950 units/hr was 0.47 units/ml, which is within the goal range for this patient. Per RN, no issues with IV or bleeding observed.  Today, INR is subtherapeutic at 1.1 after no warfarin for last two days. Plts are low, but trending up at 147. H/H stable.   Goal of Therapy:  Heparin level 0.3-0.5 units/ml due to recent CVA Monitor platelets by  anticoagulation protocol: Yes   Plan:  Continue heparin infusion at 950 units/hr Check 8-hr heparin level Warfarin 2.5 mg x 1 tonight (completed) Monitor daily heparin level, INR, CBC Monitor for signs/symptoms of bleeding  Gillermina Hu, PharmD, BCPS, [facility] Clinical Pharmacist 08/22/2019,10:04 PM

## 2019-08-22 NOTE — Progress Notes (Signed)
Hospitalist progress note   Jonathan Scherger Sr. 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  Covid versus LV thrombus Continue aspirin Lipitor Coumadin Will need skilled facility placement Appreciate SLP input in advance ESRD/HD Prior was on MWF-further management per them Need perm access--TDC to continue to be used--needs OP Vasc or IR follow up LV thrombus Therapeutic on Coumadin--coniotnue Heparin bridging back to coumadin1/14 Prior cadaveric renal transplant Continue prednisone 5---can probably d/c steroids--defer to to renal  Subjective:  Doing fair no new issue Sleepy Only had 1.5 liters off today--low bp on HD  Consultants:   renal Procedures:   n Antimicrobials:   n    Objective: Vitals:   08/22/19 1030 08/22/19 1045 08/22/19 1100 08/22/19 1106  BP: (!) 100/44 (!) 75/39 (!) 93/52 (!) 122/56  Pulse: 81 87 84 83  Resp:    18  Temp:    98 F (36.7 C)  TempSrc:    Oral  SpO2:    98%  Weight:    70.6 kg  Height:        Intake/Output Summary (Last 24 hours) at 08/22/2019 1442 Last data filed at 08/22/2019 1106 Gross per 24 hour  Intake 361.1 ml  Output 1532 ml  Net -1170.9 ml   Filed Weights   08/20/19 0725 08/22/19 0811 08/22/19 1106  Weight: 71.8 kg 72.2 kg  70.6 kg    Examination:  alert awake No le edema Neuro intact but slow responses--monosyllabic and pacuity of vocab Neuro seems bilaterally intact to power  Scheduled Meds: .  stroke: mapping our early stages of recovery book   Does not apply Once  . allopurinol  100 mg Oral Daily  . aspirin  81 mg Oral Daily  . atorvastatin  40 mg Oral Daily  . Chlorhexidine Gluconate Cloth  6 each Topical Q0600  . darbepoetin (ARANESP) injection - DIALYSIS  100 mcg Intravenous Q Tue-HD  . feeding supplement (PRO-STAT SUGAR FREE 64)  30 mL Oral BID  . metoCLOPramide  5 mg Oral BID WC  . metoprolol succinate  25 mg Oral Daily  . multivitamin  1 tablet Oral QHS  . pantoprazole  40 mg Oral Daily  . phosphorus  500 mg Oral TID  . predniSONE  5 mg Oral Q breakfast  . sodium chloride flush  3 mL Intravenous Q12H  . warfarin  2.5 mg Oral ONCE-1800  . Warfarin - Pharmacist Dosing Inpatient   Does not apply q1800   Continuous Infusions: . heparin 950 Units/hr (08/22/19 1328)     LOS: 23 days   Time spent: [place], MD [facility]  08/22/2019, 2:42 PM

## 2019-08-22 NOTE — Progress Notes (Signed)
Physical Therapy Treatment Patient Details Name: Jonathan [name]. MRN: [id number] DOB: September 07, 1950 Today's Date: 08/22/2019    History of Present Illness 69 y.o. 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 Diagnosed with COVID 6 days prior to coming to ED, where chest x-ray showed multifocal pneumonia. Admitted 07/30/19 for treatment of acute encephalopathy, difficulty swallowing and pneumonia.MRI of the brain showed small acute infarcts in the left pons, left occipital lob and right frontal lobe in addition to chronic infarcts     PT Comments    Patient received in bed. Agreeable to LE exercises in bed. Declines out of bed activities states "I don't need to get up." Patient requires constant cues to continue doing exercises and finally states "I'm done, I'm not doing any more exercises." after a minimal amount completed. Patient gets slightly agitated with requests. Patient will continue to benefit from skilled PT while here (as tolerated) to improve strength and functional independence.      Follow Up Recommendations  SNF     Equipment Recommendations  None recommended by PT    Recommendations for Other Services       Precautions / Restrictions Precautions Precautions: Fall Restrictions Weight Bearing Restrictions: No    Mobility  Bed Mobility               General bed mobility comments: not assessed, refused  Transfers                 General transfer comment: patient refusing OOB activity  Ambulation/Gait                 Stairs             Wheelchair Mobility    Modified Rankin (Stroke Patients Only) Modified Rankin (Stroke Patients Only) Pre-Morbid Rankin Score: Moderate disability Modified Rankin: Moderately severe disability     Balance        Sitting balance - Comments: declined sitting up                                    Cognition Arousal/Alertness: Awake/alert Behavior During Therapy: Flat affect Overall Cognitive Status: Impaired/Different from baseline Area of Impairment: Memory;Safety/judgement;Awareness;Following commands;Problem solving;Attention                 Orientation Level: Situation Current Attention Level: Selective Memory: Decreased short-term memory Following Commands: Follows one step commands inconsistently;Follows one step commands with increased time Safety/Judgement: Decreased awareness of safety;Decreased awareness of deficits Awareness: Intellectual Problem Solving: Slow processing;Requires verbal cues;Decreased initiation;Difficulty sequencing;Requires tactile cues General Comments: patient following commands intermittently, he declines OOB activity, agrees to bed exercises, but then quickly declines any more exercises once he begins.      Exercises Other Exercises Other Exercises: ap, heel slides, hip abd/add x 3-5 reps each bilaterally.    General Comments        Pertinent Vitals/Pain Pain Assessment: No/denies pain    Home Living                      Prior Function            PT Goals (current goals can now be found in the care plan section) Acute Rehab PT Goals Patient Stated Goal: none stated PT Goal  Formulation: Patient unable to participate in goal setting Progress towards PT goals: Not progressing toward goals - comment(declining out of bed activities)    Frequency    Min 2X/week      PT Plan Current plan remains appropriate    Co-evaluation              AM-PAC PT "6 Clicks" Mobility   Outcome Measure  Help needed turning from your back to your side while in a flat bed without using bedrails?: A Little Help needed moving from lying on your back to sitting on the side of a flat bed without using bedrails?: A Little Help  needed moving to and from a bed to a chair (including a wheelchair)?: A Little Help needed standing up from a chair using your arms (e.g., wheelchair or bedside chair)?: A Lot Help needed to walk in hospital room?: A Lot Help needed climbing 3-5 steps with a railing? : A Lot 6 Click Score: 15    End of Session   Activity Tolerance: Other (comment)(limited by cognition/refusal to participate) Patient left: in bed;with call bell/phone within reach;with bed alarm set Nurse Communication: Mobility status PT Visit Diagnosis: Unsteadiness on feet (R26.81);Other abnormalities of gait and mobility (R26.89);Muscle weakness (generalized) (M62.81);Ataxic gait (R26.0);Difficulty in walking, not elsewhere classified (R26.2)     Time: [id number] PT Time Calculation (min) (ACUTE ONLY): 8 min  Charges:  $Therapeutic Exercise: 8-22 mins                     Kamika Goodloe, PT, GCS 08/22/19,1:46 PM

## 2019-08-23 LAB — GLUCOSE, CAPILLARY
Glucose-Capillary: 135 mg/dL — ABNORMAL HIGH (ref 70–99)
Glucose-Capillary: 170 mg/dL — ABNORMAL HIGH (ref 70–99)
Glucose-Capillary: 201 mg/dL — ABNORMAL HIGH (ref 70–99)
Glucose-Capillary: 253 mg/dL — ABNORMAL HIGH (ref 70–99)
Glucose-Capillary: 301 mg/dL — ABNORMAL HIGH (ref 70–99)
Glucose-Capillary: 383 mg/dL — ABNORMAL HIGH (ref 70–99)

## 2019-08-23 LAB — CBC
HCT: 28.2 % — ABNORMAL LOW (ref 39.0–52.0)
Hemoglobin: 9.3 g/dL — ABNORMAL LOW (ref 13.0–17.0)
MCH: 33.2 pg (ref 26.0–34.0)
MCHC: 33 g/dL (ref 30.0–36.0)
MCV: 100.7 fL — ABNORMAL HIGH (ref 80.0–100.0)
Platelets: 172 10*3/uL (ref 150–400)
RBC: 2.8 MIL/uL — ABNORMAL LOW (ref 4.22–5.81)
RDW: 15.8 % — ABNORMAL HIGH (ref 11.5–15.5)
WBC: 8.4 10*3/uL (ref 4.0–10.5)
nRBC: 0.2 % (ref 0.0–0.2)

## 2019-08-23 LAB — PROTIME-INR
INR: 1.2 (ref 0.8–1.2)
Prothrombin Time: 14.6 seconds (ref 11.4–15.2)

## 2019-08-23 LAB — PHOSPHORUS: Phosphorus: 2.7 mg/dL (ref 2.5–4.6)

## 2019-08-23 LAB — HEPARIN LEVEL (UNFRACTIONATED): Heparin Unfractionated: 0.41 IU/mL (ref 0.30–0.70)

## 2019-08-23 MED ORDER — ENSURE ENLIVE PO LIQD
237.0000 mL | Freq: Three times a day (TID) | ORAL | Status: DC
  Administered 2019-08-23 – 2019-09-11 (×23): 237 mL via ORAL

## 2019-08-23 MED ORDER — WARFARIN SODIUM 2.5 MG PO TABS
2.5000 mg | ORAL_TABLET | Freq: Once | ORAL | Status: AC
  Administered 2019-08-23: 2.5 mg via ORAL
  Filled 2019-08-23: qty 1

## 2019-08-23 NOTE — TOC Progression Note (Addendum)
Transition of Care (TOC) - Progression Note    Patient Details  Name: Jonathan Shiao Sr. MRN: [id number] Date of Birth: [birth year]  Transition of Care [facility]) CM/SW Contact  Sharlet Salina Mila Homer, LCSW Phone Number: 08/23/2019, 1:55 PM  Clinical Narrative:  Search continues for a SNF for patient. On 1/14 an additional call made to Freda Munro at [facility] and she has some COVID beds, but is not can't take [employer].    On 1/15, the following call were made: 1. [facility] [phone]) Left message for Baird Lyons, Director of Transitional Services. 2. [facility] in [place] ( [phone]) Spoke with Amber in admissions and was advised that they are not accepting HD patients at this time. [facility] [phone]) - Left a message for Trudy in admissions 4. [facility] [phone]) - Was advised by Levada Dy in admissions that they can accommodate COVID patient needing dialysis at this time. She recommended that the [facility] be contacted.  [facility] [place] [phone]) - Talked with Elmyra Ricks in admissions and they do accept COVID positive HD patient. CSW expressed awareness that patient's HD Center would have to be changed while in rehab. Clinicals faxed [phone]) to facility.  6. [facility] in [place] [phone]) Left message for Katie, admissions director. *3:14 pm - Talked with Rockie Neighbours and she requested clinicals be faxed: [phone]. Clinicals faxed.  7. [facility] [phone]). Left message for [facility], admissions director. 8. [facility] [employer] [phone]) Left message for Linzie Collin, admission director. [facility] in [place] [phone]) Could not leave message for Clarene Critchley, admissions director - VM full.    Expected Discharge Plan: [place]    Expected Discharge Plan and Services Expected Discharge Plan: [place] arrangements for the past 2 months: Single Family Home                                       Social Determinants of Health (SDOH) Interventions    Readmission Risk Interventions No flowsheet data found.

## 2019-08-23 NOTE — Progress Notes (Signed)
ANTICOAGULATION CONSULT NOTE - Follow Up Consult  Pharmacy Consult for Heparin Indication: h/o LV thrombus  No Known Allergies  Patient Measurements: Height: 5\' 11"  (180.3 cm) Weight: 157 lb 6.5 oz (71.4 kg) IBW/kg (Calculated) : 75.3 Heparin Dosing Weight: 71.8 kg  Vital Signs: Temp: 99 F (37.2 C) (01/15 0453) Temp Source: Oral (01/15 0453) BP: 137/67 (01/15 0453) Pulse Rate: 85 (01/15 0453)  Labs: Recent Labs    08/20/19 0728 08/20/19 0728 08/21/19 0359 08/21/19 1443 08/22/19 0948 08/22/19 0949 08/22/19 1016 08/22/19 2126 08/23/19 0550  HGB 8.0*   < >  --   --   --  8.6*  --   --  9.3*  HCT 25.2*  --   --   --   --  26.5*  --   --  28.2*  PLT 126*  --   --   --   --  147*  --   --  172  LABPROT  --   --  17.1*  --   --   --  14.3  --  14.6  INR  --   --  1.4*  --   --   --  1.1  --  1.2  HEPARINUNFRC  --   --   --    < >  --   --  0.63 0.47 0.41  CREATININE 5.56*  --   --   --  7.33*  --   --   --   --    < > = values in this interval not displayed.    Estimated Creatinine Clearance: 10 mL/min (A) (by C-G formula based on SCr of 7.33 mg/dL (H)).   Assessment: Pt is a 69 year old male with history of renal transplant in 05/2014 and now with ESRD on HD TTS. Pt has been on Eliquis for left ventricular thombus, which has been switched to warfarin. Patient was placed on heparin bridge so that the [facility] can be removed. However, patient's AVF was not able to be cannulated on 1/14; therefore, plans have changed to keep the [facility] in for now. Pharmacy has been consulted to resume warfarin dosing and dose heparin. Of note, patient has also had a recent stroke.  Heparin level on drip rate of 950 units/hr was 0.41units/ml, which is within the goal range for this patient. Per RN, no issues with IV or bleeding observed.  Today, INR is subtherapeutic at 1.2 after no warfarin for last two days. Plts are trending up and now normal at 172. H/H stable.   Goal of Therapy:  Heparin level  0.3-0.5 units/ml due to recent CVA Monitor platelets by anticoagulation protocol: Yes   Plan:  Continue heparin infusion at 950 units/hr Warfarin 2.5 mg x 1 tonight Monitor daily heparin level, INR, CBC Monitor for signs/symptoms of bleeding  Sherren Kerns, PharmD PGY1 Acute Care Pharmacy Resident 08/23/2019,7:04 AM

## 2019-08-23 NOTE — Progress Notes (Addendum)
Initial Nutrition Assessment  DOCUMENTATION CODES:   Not applicable(suspect PCM)  INTERVENTION:    Ensure Enlive po TID, each supplement provides 350 kcal and 20 grams of protein  30 ml Prostat BID, each supplement provides 100 kcals and 15 grams protein.   Renal MVI daily   NUTRITION DIAGNOSIS:   Increased nutrient needs related to chronic illness(ESRD on HD) as evidenced by estimated needs.  GOAL:   Patient will meet greater than or equal to 90% of their needs  MONITOR:   PO intake, Supplement acceptance, Weight trends, Labs, I & O's, Skin  REASON FOR ASSESSMENT:   LOS    ASSESSMENT:   Patient with PMH significant for R kidney transplant, ESRD on HD, CVA, HTN, and DM. Presents this admission with COVID 19 PNA.   Pt now out of 21 day isolation window. RD working remote.  Unable to reach pt by phone. Pt's diet upgraded to regular from [facility] yesterday. Meal completions charted as 50-100% for his last five meals (average 65%). Will provide supplementation to maximize kcal and protein this admission.   Per records pt showed to weigh 81.6 kg at [facility] on 06/06/2019 and 70.6 kg this admission. As pt is under dry weight, suspect malnutrition, but unable to diagnose without NFPE or detailed dietary recall.   EDW: 80.5 kg  Current weight:   I/O: +1,021 ml since 1/1 Last HD yesterday: 1257 ml net UF  Medications: aranesp, 5 mg reglan BID, rena-vit, prednisone Labs: CBG 116-239 Na 132 (L)   Diet Order:   Diet Order            Diet regular Room service appropriate? Yes; Fluid consistency: Thin  Diet effective now              EDUCATION NEEDS:   Not appropriate for education at this time  Skin:  Skin Assessment: Reviewed RN Assessment  Last BM:  1/14  Height:   Ht Readings from Last 1 Encounters:  07/31/19 5\' 11"  (1.803 m)    Weight:   Wt Readings from Last 1 Encounters:  08/22/19 71.4 kg    Ideal Body Weight:  78.2 kg  BMI:  Body mass index is  21.95 kg/m.  Estimated Nutritional Needs:   Kcal:  2300-2500 kcal  Protein:  115-130 grams  Fluid:  1000 ml + UOP   Mariana Single RD, LDN Clinical Nutrition Pager # [phone]

## 2019-08-23 NOTE — Plan of Care (Signed)
  Problem: Coping: Goal: Psychosocial and spiritual needs will be supported Outcome: Progressing   

## 2019-08-23 NOTE — Progress Notes (Addendum)
[name] Progress Note   Subjective:   Patient seen in room. Reports feeling well, no concerns. Denies SOB, cough, orthopnea, CP, palpitations, abdominal pain, N/V/D.   Objective Vitals:   08/22/19 1500 08/22/19 2224 08/23/19 0453 08/23/19 0806  BP: (!) 133/59 134/71 137/67 136/74  Pulse: 90 93 85 93  Resp: 18 16 18 18   Temp: 98.2 F (36.8 C) 98.9 F (37.2 C) 99 F (37.2 C) 98.6 F (37 C)  TempSrc:  Oral Oral Oral  SpO2: 100% 98% 96% 96%  Weight:  71.4 kg    Height:       Physical Exam General: Well developed, well nourished male. Alert and in NAD Heart: RRR, no murmurs, rubs or gallops Lungs: Lungs CTA bilaterally without wheezing, rhonchi or rales Abdomen: Abdomen soft, non-tender, non-distended. +BS Extremities: No peripheral edema b/l lower extremities Dialysis Access: RUE AVF + thrill/bruit, R IJ TDC without surrounding erythema/bleeding  Additional Objective Labs: Basic Metabolic Panel: Recent Labs  Lab 08/19/19 0838 08/19/19 0838 08/20/19 0728 08/20/19 0728 08/22/19 0948 08/22/19 2126 08/23/19 0550  NA 138  --  131*  --  132*  --   --   K 4.3  --  3.9  --  3.9  --   --   CL 98  --  94*  --  95*  --   --   CO2 30  --  28  --  25  --   --   GLUCOSE 102*  --  184*  --  188*  --   --   BUN 29*  --  26*  --  50*  --   --   CREATININE 7.01*  --  5.56*  --  7.33*  --   --   CALCIUM 8.2*  --  7.6*  --  8.0*  --   --   PHOS 2.7   < > 2.6   < > 1.2* 1.4* 2.7   < > = values in this interval not displayed.   Liver Function Tests: Recent Labs  Lab 08/19/19 0838 08/20/19 0728 08/22/19 0948  ALBUMIN 2.5* 2.3* 2.6*   CBC: Recent Labs  Lab 08/18/19 0206 08/18/19 0206 08/19/19 0826 08/19/19 0826 08/20/19 0728 08/22/19 0949 08/23/19 0550  WBC 9.1   < > 6.4   < > 5.7 8.2 8.4  HGB 9.2*   < > 8.5*   < > 8.0* 8.6* 9.3*  HCT 28.6*   < > 26.2*   < > 25.2* 26.5* 28.2*  MCV 103.6*  --  102.3*  --  102.9* 103.1* 100.7*  PLT 144*   < > 121*   <  > 126* 147* 172   < > = values in this interval not displayed.   Blood Culture    Component Value Date/Time   SDES BLOOD SITE NOT SPECIFIED 07/30/2019 1843   SPECREQUEST  07/30/2019 1843    BOTTLES DRAWN AEROBIC AND ANAEROBIC Blood Culture results may not be optimal due to an inadequate volume of blood received in culture bottles   CULT  07/30/2019 1843    NO GROWTH 5 DAYS Performed at [facility], [place] [address], [place], [place] [address]    REPTSTATUS 08/04/2019 FINAL 07/30/2019 1843    CBG: Recent Labs  Lab 08/22/19 1641 08/22/19 2004 08/23/19 0008 08/23/19 0359 08/23/19 0723  GLUCAP 203* 239* 201* 170* 135*    Studies/Results: DG Swallowing Func-Speech Pathology  Result Date: 08/21/2019 Objective Swallowing Evaluation: Type of Study:  MBS-Modified Barium Swallow Study  Patient Details Name: Jonathan Pollan Sr. MRN: [id number] Date of Birth: 06/23/1951 Today's Date: 08/21/2019 Time: SLP Start Time (ACUTE ONLY): 1335 -SLP Stop Time (ACUTE ONLY): 1355 SLP Time Calculation (min) (ACUTE ONLY): 20 min Past Medical History: Past Medical History: Diagnosis Date . Diabetes mellitus without complication ([place])  . Hypertension  . Renal disorder 2015  right kidney transplant . Stroke [facility])  Past Surgical History: Past Surgical History: Procedure Laterality Date . BACK SURGERY    Has had 2 back surgeries . Depression   . HAND SURGERY Right  . HAND SURGERY   . KIDNEY TRANSPLANT    November 2015 . Memory loss   . NEPHRECTOMY TRANSPLANTED ORGAN   HPI: Jonathan Halleck Sr. is a 69 y.o. male with history of ESRD on hemodialysis, LV thrombus, renal transplant, stroke, hypertension diabetes mellitus was having increasing difficulty swallowing over the last 2 weeks as noticed by patient's wife (pocketing his food. Per chart 6 days ago diagnosed with Covid at the time he was not symptomatic. Now more short of breath and lethargic and confused. MRI small acute infarcts in the left pons, left occipital lobe,  and left right frontal lobe, severe chronic small vessel ischemic disease with multiple chronic infarcts as above. Chest x-ray showing multifocal pneumonia.  Subjective: alert Assessment / Plan / Recommendation CHL IP CLINICAL IMPRESSIONS 08/21/2019 Clinical Impression Pt demonstrates mild oropharyngeal dyspahgia with liquids pooling in the pyriform sinuses prior to the swallow. Pt consistenty took one sip at a time, and though swallow somewhat delayed, no penetration or aspiration occurred. Post swallow, no reside present though vocal quality audibly wet. Given that during bedside observation pt also consistently takes one sip at a time without cueing, recommend upgrade to thin liquids with mechanical soft diet given difficutly with self feeding with cognitive impairment. Will sign off for swallowing and proceed with cognitive linguistic per MD.  SLP Visit Diagnosis Dysphagia, oropharyngeal phase (R13.12) Attention and concentration deficit following -- Frontal lobe and executive function deficit following -- Impact on safety and function Mild aspiration risk   CHL IP TREATMENT RECOMMENDATION 08/21/2019 Treatment Recommendations No treatment recommended at this time   Prognosis 08/01/2019 Prognosis for Safe Diet Advancement Good Barriers to Reach Goals Cognitive deficits Barriers/Prognosis Comment -- CHL IP DIET RECOMMENDATION 08/21/2019 SLP Diet Recommendations Dysphagia 3 (Mech soft) solids;Thin liquid Liquid Administration via Cup;Straw Medication Administration Whole meds with puree Compensations Slow rate;Small sips/bites Postural Changes Seated upright at 90 degrees   CHL IP OTHER RECOMMENDATIONS 08/21/2019 Recommended Consults -- Oral Care Recommendations Oral care BID Other Recommendations --   CHL IP FOLLOW UP RECOMMENDATIONS 08/19/2019 Follow up Recommendations Skilled Nursing facility   [facility] IP FREQUENCY AND DURATION 08/02/2019 Speech Therapy Frequency (ACUTE ONLY) min 2x/week Treatment Duration --      CHL IP  ORAL PHASE 08/21/2019 Oral Phase WFL Oral - Pudding Teaspoon -- Oral - Pudding Cup -- Oral - Honey Teaspoon -- Oral - Honey Cup -- Oral - Nectar Teaspoon -- Oral - Nectar Cup -- Oral - Nectar Straw -- Oral - Thin Teaspoon -- Oral - Thin Cup -- Oral - Thin Straw -- Oral - Puree -- Oral - Mech Soft -- Oral - Regular -- Oral - Multi-Consistency -- Oral - Pill -- Oral Phase - Comment --  CHL IP PHARYNGEAL PHASE 08/21/2019 Pharyngeal Phase Impaired Pharyngeal- Pudding Teaspoon -- Pharyngeal -- Pharyngeal- Pudding Cup -- Pharyngeal -- Pharyngeal- Honey Teaspoon -- Pharyngeal -- Pharyngeal- Honey Cup -- Pharyngeal --  Pharyngeal- Nectar Teaspoon -- Pharyngeal -- Pharyngeal- Nectar Cup -- Pharyngeal -- Pharyngeal- Nectar Straw -- Pharyngeal -- Pharyngeal- Thin Teaspoon -- Pharyngeal -- Pharyngeal- Thin Cup Delayed swallow initiation-pyriform sinuses Pharyngeal -- Pharyngeal- Thin Straw Delayed swallow initiation-pyriform sinuses Pharyngeal -- Pharyngeal- Puree Delayed swallow initiation-vallecula Pharyngeal -- Pharyngeal- Mechanical Soft -- Pharyngeal -- Pharyngeal- Regular Delayed swallow initiation-vallecula Pharyngeal -- Pharyngeal- Multi-consistency -- Pharyngeal -- Pharyngeal- Pill -- Pharyngeal -- Pharyngeal Comment --  No flowsheet data found. Herbie [place], MA CCC-SLP Acute Rehabilitation Services Pager [phone] Office [phone] Lynann Beaver 08/21/2019, 2:28 PM              Medications: . heparin 950 Units/hr (08/23/19 0410)   .  stroke: mapping our early stages of recovery book   Does not apply Once  . allopurinol  100 mg Oral Daily  . aspirin  81 mg Oral Daily  . atorvastatin  40 mg Oral Daily  . Chlorhexidine Gluconate Cloth  6 each Topical Q0600  . darbepoetin (ARANESP) injection - DIALYSIS  100 mcg Intravenous Q Tue-HD  . feeding supplement (ENSURE ENLIVE)  237 mL Oral TID BM  . feeding supplement (PRO-STAT SUGAR FREE 64)  30 mL Oral BID  . metoCLOPramide  5 mg Oral BID WC  .  metoprolol succinate  25 mg Oral Daily  . multivitamin  1 tablet Oral QHS  . pantoprazole  40 mg Oral Daily  . predniSONE  5 mg Oral Q breakfast  . sodium chloride flush  3 mL Intravenous Q12H  . warfarin  2.5 mg Oral ONCE-1800  . Warfarin - Pharmacist Dosing Inpatient   Does not apply q1800    Dialysis Orders: TTS G/O -  [facility] when COVID 19 negative 4 hr 15 min 180NRE 400/800 80.5 kg 2.0 K/ 2.0 Ca R AVF/RIJ TDC -Heparin 8000 units IV TIW -Mircera 75 mcg IV q 2 weeks (last dose 07/25/19) -Venofer 50 mg IV weekly -Parsabiv 7.5 mg IV TIW  Assessment/Plan: 1.Acute COVID-19 pneumonia: s/p remdesivir and Decadron. As per primary team. Now out of 21 day isolation window but will still be required to have two negative COVID 19 tests before he can return to [facility], can dialyze at [place] [facility] until COVID tests negative.  2. ESRD -HD per TTS schedule recently.Next HD 1/16 on schedule. K+ 3.9, use added K+ bath.Had been using AVF here with 16g needles however was not able to cannulate 1/14 with 15g. Using [facility]. Will get [facility] out when he can successfully be cannulated with 15g needles. Abbreviated treatment due to high census. OK for dc from renal standpoint.  3. Anemia -Hgb 9.3.  On weekly aranesp 100 mcg IV weekly. Missed dose last week. Given 08/20/19. 4. Secondary hyperparathyroidism -Phos 1.4 yesterday > 2.7 this AM after IV replacement. Calcium acetate and renvala discontinued. Parsabiv not on hospital formulary. 5. HTN/volume - BP adequately controlled, no volume overload on exam. Continue metoprolol. Well below outpatient EDW now, lower at discharge.  6. Nutrition -Albumin low. Dys 3 diet. Continue prostat/renal vit.  7. Acute CVA-On ASA, Coumadin, lipitor. Per primary.   Anice Paganini, PA-C 08/23/2019, 9:57 AM  [facility] Pager: [phone]  Pt seen, examined and agree w A/P as above. Appreciate SW efforts for SNF placement.  Kelly Splinter  MD 08/23/2019, 3:34  PM

## 2019-08-23 NOTE — Progress Notes (Signed)
Hospitalist progress note   Jonathan Swenor Sr. 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, DM TY 2, HTN, chronic hepatitis C (treated), MI at age 60, prior stroke Admitted 12/22 swallowing difficulties pocketing food more lethargic-MRI brain showed small infarct in the left pons left occiput neurology saw the patient felt this was secondary to left ventricular thrombus--he was started on heparin He was diagnosed with Covid 07/24/2019 Data Reviewed:   BUN/Creatinine 29/7.0--->50/7.3 Sodium 131 Hemoglobin down from 9.2-8.0-->9.3, WBC 8.4 platelet 172 INR 1.2  Assessment & Plan: COVID-19 pneumonia Resolved --testing + still howeer--precludes some HD centers Acute strokes left pons left occipital Etiology unclear?  Covid versus LV thrombus Continue aspirin Lipitor Coumadin Will need skilled facility placementnce ESRD/HD Prior was on MWF-further management per them Need perm access--TDC to continue to be used--needs OP Vasc or IR follow up LV thrombus subtx on Coumadin--bridging with Heparin until INR 2.0--not ready for d/c yet bridging back to coumadin 1/14 Prior cadaveric renal transplant Continue prednisone 5---can probably d/c steroids--defer to to renal  Subjective:  Sleepy--didn't really seems to want to be dsturbed  Consultants:   renal Procedures:   n Antimicrobials:   n    Objective: Vitals:   08/22/19 1500 08/22/19 2224 08/23/19 0453 08/23/19 0806  BP: (!) 133/59 134/71 137/67 136/74  Pulse: 90 93 85 93  Resp: 18 16 18 18   Temp: 98.2 F (36.8 C) 98.9 F (37.2 C) 99 F (37.2 C) 98.6 F (37 C)  TempSrc:  Oral Oral Oral  SpO2: 100% 98% 96% 96%  Weight:  71.4 kg    Height:        Intake/Output Summary (Last 24 hours) at 08/23/2019 1412 Last data filed at 08/23/2019 0900 Gross per 24 hour  Intake 943.84 ml  Output 0 ml   Net 943.84 ml   Filed Weights   08/22/19 0811 08/22/19 1106 08/22/19 2224  Weight: 72.2 kg 70.6 kg 71.4 kg    Examination:  alert awake diminished vocab range No le edema Moving 4 limbs  Neuro seems bilaterally intact to power  Scheduled Meds: .  stroke: mapping our early stages of recovery book   Does not apply Once  . allopurinol  100 mg Oral Daily  . aspirin  81 mg Oral Daily  . atorvastatin  40 mg Oral Daily  . Chlorhexidine Gluconate Cloth  6 each Topical Q0600  . darbepoetin (ARANESP) injection - DIALYSIS  100 mcg Intravenous Q Tue-HD  . feeding supplement (ENSURE ENLIVE)  237 mL Oral TID BM  . feeding supplement (PRO-STAT SUGAR FREE 64)  30 mL Oral BID  . metoCLOPramide  5 mg Oral BID WC  . metoprolol succinate  25 mg Oral Daily  . multivitamin  1 tablet Oral QHS  . pantoprazole  40 mg Oral Daily  . predniSONE  5 mg Oral Q breakfast  . sodium chloride flush  3 mL Intravenous Q12H  . warfarin  2.5 mg Oral ONCE-1800  . Warfarin - Pharmacist Dosing Inpatient   Does not apply q1800   Continuous Infusions: . heparin 950 Units/hr (08/23/19 0410)     LOS: 24 days   Time spent: [place], MD [facility]  08/23/2019, 2:12 PM

## 2019-08-24 LAB — RENAL FUNCTION PANEL
Albumin: 2.6 g/dL — ABNORMAL LOW (ref 3.5–5.0)
Anion gap: 14 (ref 5–15)
BUN: 56 mg/dL — ABNORMAL HIGH (ref 8–23)
CO2: 26 mmol/L (ref 22–32)
Calcium: 8.2 mg/dL — ABNORMAL LOW (ref 8.9–10.3)
Chloride: 95 mmol/L — ABNORMAL LOW (ref 98–111)
Creatinine, Ser: 6.97 mg/dL — ABNORMAL HIGH (ref 0.61–1.24)
GFR calc Af Amer: 9 mL/min — ABNORMAL LOW (ref >60)
GFR calc non Af Amer: 7 mL/min — ABNORMAL LOW (ref >60)
Glucose, Bld: 253 mg/dL — ABNORMAL HIGH (ref 70–99)
Phosphorus: 2.1 mg/dL — ABNORMAL LOW (ref 2.5–4.6)
Potassium: 4.1 mmol/L (ref 3.5–5.1)
Sodium: 135 mmol/L (ref 135–145)

## 2019-08-24 LAB — CBC
HCT: 26.3 % — ABNORMAL LOW (ref 39.0–52.0)
Hemoglobin: 8.7 g/dL — ABNORMAL LOW (ref 13.0–17.0)
MCH: 33.6 pg (ref 26.0–34.0)
MCHC: 33.1 g/dL (ref 30.0–36.0)
MCV: 101.5 fL — ABNORMAL HIGH (ref 80.0–100.0)
Platelets: 190 10*3/uL (ref 150–400)
RBC: 2.59 MIL/uL — ABNORMAL LOW (ref 4.22–5.81)
RDW: 15.7 % — ABNORMAL HIGH (ref 11.5–15.5)
WBC: 9.9 10*3/uL (ref 4.0–10.5)
nRBC: 0.4 % — ABNORMAL HIGH (ref 0.0–0.2)

## 2019-08-24 LAB — GLUCOSE, CAPILLARY
Glucose-Capillary: 159 mg/dL — ABNORMAL HIGH (ref 70–99)
Glucose-Capillary: 185 mg/dL — ABNORMAL HIGH (ref 70–99)
Glucose-Capillary: 249 mg/dL — ABNORMAL HIGH (ref 70–99)
Glucose-Capillary: 324 mg/dL — ABNORMAL HIGH (ref 70–99)
Glucose-Capillary: 326 mg/dL — ABNORMAL HIGH (ref 70–99)

## 2019-08-24 LAB — PROTIME-INR
INR: 1.2 (ref 0.8–1.2)
Prothrombin Time: 14.6 seconds (ref 11.4–15.2)

## 2019-08-24 LAB — HEPARIN LEVEL (UNFRACTIONATED)
Heparin Unfractionated: 0.31 IU/mL (ref 0.30–0.70)
Heparin Unfractionated: 0.46 IU/mL (ref 0.30–0.70)

## 2019-08-24 MED ORDER — HEPARIN SODIUM (PORCINE) 1000 UNIT/ML IJ SOLN
INTRAMUSCULAR | Status: AC
  Administered 2019-08-24: 3200 [IU]
  Filled 2019-08-24: qty 4

## 2019-08-24 MED ORDER — WARFARIN SODIUM 4 MG PO TABS
4.0000 mg | ORAL_TABLET | Freq: Once | ORAL | Status: AC
  Administered 2019-08-24: 4 mg via ORAL
  Filled 2019-08-24: qty 1

## 2019-08-24 NOTE — Progress Notes (Signed)
Hospitalist progress note   Jonathan Blauer Sr. 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, DM TY 2, HTN, chronic hepatitis C (treated), MI at age 60, prior stroke Admitted 12/22 swallowing difficulties pocketing food more lethargic-MRI brain showed small infarct in the left pons left occiput neurology saw the patient felt this was secondary to left ventricular thrombus--he was started on heparin He was diagnosed with Covid 07/24/2019 Data Reviewed:   BUN/Creatinine 29/7.0--->56/6.97 Phos 2.1 Sodium 135 Hemoglobin down from 9.2-8.0-->9.3, WBC 8.4 platelet 172 INR 1.2  Assessment & Plan: COVID-19 pneumonia Resolved --testing + still however--precludes some HD centers Acute strokes left pons left occipital Etiology unclear?  Covid versus LV thrombus Continue aspirin Lipitor Coumadin Will need skilled facility placement ESRD/HD-hypophos Prior was on MWF-furth er management per them Need perm access--TDC to continue to be used--needs OP Vasc or IR follow up Renal replacing lytes as prn LV thrombus subtx on Coumadin--bridging with Heparin til INR 2.0--not ready for d/c yet bridging back to coumadin 1/14 Prior cadaveric renal transplant Continue prednisone 5---can probably d/c steroids--defer to to renal  Awaiting disposition to skilled facility--INR needs to be therapeutic but can be transitioned to Lovenox anytime No family present  Subjective:  Coherent but not getting out of bed and refuses this per nursing-patient questioned directly about mobility and just does not seem to want to get out of bed Patient gently encouraged to either move it or he might lose more function No other complaints or other issues  Consultants:   renal Procedures:   n Antimicrobials:   n    Objective: Vitals:   08/24/19 0930 08/24/19 1000 08/24/19 1026  08/24/19 1118  BP: (!) 93/46 (!) 107/46 (!) 131/59 (!) 108/52  Pulse: 92 89 88 88  Resp:   18 18  Temp:   99 F (37.2 C) 98.2 F (36.8 C)  TempSrc:   Oral Oral  SpO2:   94% 98%  Weight:   71.2 kg   Height:        Intake/Output Summary (Last 24 hours) at 08/24/2019 1312 Last data filed at 08/24/2019 1226 Gross per 24 hour  Intake 830.38 ml  Output 1325 ml  Net -494.62 ml   Filed Weights   08/23/19 2043 08/24/19 0734 08/24/19 1026  Weight: 72.6 kg 72 kg 71.2 kg    Examination:  alert coherent no distress EOMI NCAT No focal deficit Abdomen soft nontender No lower extremity edema neurointact  Scheduled Meds: .  stroke: mapping our early stages of recovery book   Does not apply Once  . allopurinol  100 mg Oral Daily  . aspirin  81 mg Oral Daily  . atorvastatin  40 mg Oral Daily  . Chlorhexidine Gluconate Cloth  6 each Topical Q0600  . darbepoetin (ARANESP) injection - DIALYSIS  100 mcg Intravenous Q Tue-HD  . feeding supplement (ENSURE ENLIVE)  237 mL Oral TID BM  . feeding supplement (PRO-STAT SUGAR FREE 64)  30 mL Oral BID  . metoCLOPramide  5 mg Oral BID WC  . metoprolol succinate  25 mg Oral Daily  . multivitamin  1 tablet Oral QHS  . pantoprazole  40 mg Oral Daily  . predniSONE  5 mg Oral Q breakfast  . sodium chloride flush  3 mL Intravenous Q12H  . warfarin  4 mg Oral ONCE-1800  . Warfarin - Pharmacist Dosing Inpatient  Does not apply q1800   Continuous Infusions: . heparin 1,000 Units/hr (08/24/19 0803)     LOS: 25 days   Time spent: [place], MD [facility]  08/24/2019, 1:12 PM

## 2019-08-24 NOTE — Progress Notes (Addendum)
ANTICOAGULATION CONSULT NOTE - Follow Up Consult  Pharmacy Consult for Heparin Indication: h/o LV thrombus  No Known Allergies  Patient Measurements: Height: 5\' 11"  (180.3 cm) Weight: 156 lb 15.5 oz (71.2 kg) IBW/kg (Calculated) : 75.3 Heparin Dosing Weight: 71.8 kg  Vital Signs: Temp: 98.2 F (36.8 C) (01/16 1118) Temp Source: Oral (01/16 1118) BP: 108/52 (01/16 1118) Pulse Rate: 88 (01/16 1118)  Labs: Recent Labs     0000 08/22/19 0948 08/22/19 0949 08/22/19 1016 08/22/19 2126 08/23/19 0550 08/24/19 0340 08/24/19 1404  HGB   < >  --  8.6*  --   --  9.3* 8.7*  --   HCT  --   --  26.5*  --   --  28.2* 26.3*  --   PLT  --   --  147*  --   --  172 190  --   LABPROT  --   --   --  14.3  --  14.6 14.6  --   INR  --   --   --  1.1  --  1.2 1.2  --   HEPARINUNFRC  --   --   --  0.63   < > 0.41 0.31 0.46  CREATININE  --  7.33*  --   --   --   --  6.97*  --    < > = values in this interval not displayed.    Estimated Creatinine Clearance: 10.5 mL/min (A) (by C-G formula based on SCr of 6.97 mg/dL (H)).   Assessment: Pt is a 69 year old male with history of renal transplant in 05/2014 and now with ESRD on HD TTS. Pt has been on Eliquis for left ventricular thombus, which has been switched to warfarin. Patient was placed on heparin bridge so that the [facility] can be removed. However, patient's AVF was not able to be cannulated on 1/14; therefore, plans have changed to keep the [facility] in for now. Pharmacy has been consulted to resume warfarin dosing and dose heparin. Of note, patient has also had a recent stroke.  Heparin level on drip rate of 950 units/hr was 0.31units/ml, which is within the low goal range for this patient. Confirmatory heparin level is 0.46, within goal range on heparin gtt 100 units/hr. Per RN, no issues with IV or bleeding observed.  Today, INR remains subtherapeutic at 1.2 after 2.5 warfarin for the last two days. Plts are trending up and now normal at 190. H/H  stable.   Goal of Therapy:  Heparin level 0.3-0.5 units/ml due to recent CVA Monitor platelets by anticoagulation protocol: Yes   Plan:  Continue heparin infusion at 1000 units/hr Warfarin 4 mg x 1 tonight Monitor daily heparin level, INR, CBC Monitor for signs/symptoms of bleeding  Acey Lav, PharmD  PGY1 Acute Care Pharmacy Resident 08/24/2019,3:09 PM

## 2019-08-24 NOTE — Progress Notes (Addendum)
[facility] Progress Note   Subjective:   Patient seen on HD. Feeling well, no concerns. Using [facility] today. Pt denies SOB, dizziness, CP, palpitations, abdominal pain, N/V/D.   Objective Vitals:   08/24/19 0800 08/24/19 0815 08/24/19 0830 08/24/19 0900  BP: 113/61 (!) 68/50 (!) 101/42 (!) 108/50  Pulse: (!) 108 88 91 94  Resp:      Temp:      TempSrc:      SpO2:      Weight:      Height:       Physical Exam General: Well developed, well nourished male. Alert and in NAD Heart: RRR, no murmurs, rubs or gallops Lungs: Lungs CTA bilaterally without wheezing, rhonchi or rales Abdomen: Abdomen soft, non-tender, non-distended. +BS Extremities: No peripheral edema b/l lower extremities Dialysis Access: RUE AVF + thrill/bruit, R IJ TDC currently accessed  Additional Objective Labs: Basic Metabolic Panel: Recent Labs  Lab 08/20/19 0728 08/20/19 0728 08/22/19 0948 08/22/19 0948 08/22/19 2126 08/23/19 0550 08/24/19 0340  NA 131*  --  132*  --   --   --  135  K 3.9  --  3.9  --   --   --  4.1  CL 94*  --  95*  --   --   --  95*  CO2 28  --  25  --   --   --  26  GLUCOSE 184*  --  188*  --   --   --  253*  BUN 26*  --  50*  --   --   --  56*  CREATININE 5.56*  --  7.33*  --   --   --  6.97*  CALCIUM 7.6*  --  8.0*  --   --   --  8.2*  PHOS 2.6   < > 1.2*   < > 1.4* 2.7 2.1*   < > = values in this interval not displayed.   Liver Function Tests: Recent Labs  Lab 08/20/19 0728 08/22/19 0948 08/24/19 0340  ALBUMIN 2.3* 2.6* 2.6*   CBC: Recent Labs  Lab 08/19/19 0826 08/19/19 0826 08/20/19 0728 08/20/19 0728 08/22/19 0949 08/23/19 0550 08/24/19 0340  WBC 6.4   < > 5.7   < > 8.2 8.4 9.9  HGB 8.5*   < > 8.0*   < > 8.6* 9.3* 8.7*  HCT 26.2*   < > 25.2*   < > 26.5* 28.2* 26.3*  MCV 102.3*  --  102.9*  --  103.1* 100.7* 101.5*  PLT 121*   < > 126*   < > 147* 172 190   < > = values in this interval not displayed.   Blood Culture    Component Value  Date/Time   SDES BLOOD SITE NOT SPECIFIED 07/30/2019 1843   SPECREQUEST  07/30/2019 1843    BOTTLES DRAWN AEROBIC AND ANAEROBIC Blood Culture results may not be optimal due to an inadequate volume of blood received in culture bottles   CULT  07/30/2019 1843    NO GROWTH 5 DAYS Performed at [facility], [place] [address], [place], [place] [address]    REPTSTATUS 08/04/2019 FINAL 07/30/2019 1843    CBG: Recent Labs  Lab 08/23/19 1139 08/23/19 1622 08/23/19 2041 08/24/19 0010 08/24/19 0359  GLUCAP 253* 301* 383* 326* 249*   Medications: . heparin 950 Units/hr (08/24/19 0610)   .  stroke: mapping our early stages of recovery book   Does not apply Once  .  allopurinol  100 mg Oral Daily  . aspirin  81 mg Oral Daily  . atorvastatin  40 mg Oral Daily  . Chlorhexidine Gluconate Cloth  6 each Topical Q0600  . darbepoetin (ARANESP) injection - DIALYSIS  100 mcg Intravenous Q Tue-HD  . feeding supplement (ENSURE ENLIVE)  237 mL Oral TID BM  . feeding supplement (PRO-STAT SUGAR FREE 64)  30 mL Oral BID  . metoCLOPramide  5 mg Oral BID WC  . metoprolol succinate  25 mg Oral Daily  . multivitamin  1 tablet Oral QHS  . pantoprazole  40 mg Oral Daily  . predniSONE  5 mg Oral Q breakfast  . sodium chloride flush  3 mL Intravenous Q12H  . warfarin  4 mg Oral ONCE-1800  . Warfarin - Pharmacist Dosing Inpatient   Does not apply q1800    Dialysis Orders: TTS G/O -  [facility] when COVID 19 negative 4 hr 15 min 180NRE 400/800 80.5 kg 2.0 K/ 2.0 Ca R AVF/RIJ TDC -Heparin 8000 units IV TIW -Mircera 75 mcg IV q 2 weeks (last dose 07/25/19) -Venofer 50 mg IV weekly -Parsabiv 7.5 mg IV TIW  Assessment/Plan: 1.Acute COVID-19 pneumonia: s/p remdesivir and Decadron. As per primary team. Now out of 21 day isolation windowbut will still be required to have two negative COVID 19 tests before he can return to [facility]. Repeat test on 1/14 was +. Pt can dialyze at [facility] until COVID tests  negative. 2. ESRD -HD per TTS schedule recently.K+ 4.1. Had been using AVF herewith 16g needles however was not able to cannulate 1/14, or 1/16. Using [facility] but good thrill in AVF, attempt to use next HD. Will get [facility] out when he can successfully be cannulated with 15g needles. Abbreviated treatment due to high census. OK for dc from renal standpoint. Awaiting SNF placement.   3. Anemia -Hgb 8.7.On weekly aranesp 100 mcg IV weekly. Missed dose last week. Given 08/20/19. 4. Secondary hyperparathyroidism -Phos 1.4 yesterday > 2.7 1/15 after IV replacement > 2.1 this AM. Binders have been discontinued, not on renal diet. Follow daily RFP and replace phos PRN. Parsabiv not on hospital formulary. 5. HTN/volume - BP adequately controlled, no volume overload on exam. Continue metoprolol. Well below outpatient EDW now, lower at discharge.  6. Nutrition -Albumin low. Dys3diet. Continue prostat/renal vit.  7. Acute CVA-On ASA, Coumadin, lipitor.Per primary.   Anice Paganini, PA-C 08/24/2019, 9:17 AM  [facility] Pager: [phone]  Pt seen, examined and agree w A/P as above.  Kelly Splinter  MD 08/24/2019, 11:45 AM

## 2019-08-24 NOTE — Progress Notes (Signed)
ANTICOAGULATION CONSULT NOTE - Follow Up Consult  Pharmacy Consult for Heparin Indication: h/o LV thrombus  No Known Allergies  Patient Measurements: Height: 5\' 11"  (180.3 cm) Weight: 160 lb 0.9 oz (72.6 kg) IBW/kg (Calculated) : 75.3 Heparin Dosing Weight: 71.8 kg  Vital Signs: Temp: 99 F (37.2 C) (01/16 0444) Temp Source: Oral (01/16 0444) BP: 143/77 (01/16 0444) Pulse Rate: 95 (01/16 0444)  Labs: Recent Labs    08/21/19 1443 08/22/19 0948 08/22/19 0949 08/22/19 0949 08/22/19 1016 08/22/19 1016 08/22/19 2126 08/23/19 0550 08/24/19 0340  HGB  --   --  8.6*   < >  --   --   --  9.3* 8.7*  HCT  --   --  26.5*  --   --   --   --  28.2* 26.3*  PLT  --   --  147*  --   --   --   --  172 190  LABPROT  --   --   --   --  14.3  --   --  14.6 14.6  INR  --   --   --   --  1.1  --   --  1.2 1.2  HEPARINUNFRC   < >  --   --   --  0.63   < > 0.47 0.41 0.31  CREATININE  --  7.33*  --   --   --   --   --   --  6.97*   < > = values in this interval not displayed.    Estimated Creatinine Clearance: 10.7 mL/min (A) (by C-G formula based on SCr of 6.97 mg/dL (H)).   Assessment: Pt is a 69 year old male with history of renal transplant in 05/2014 and now with ESRD on HD TTS. Pt has been on Eliquis for left ventricular thombus, which has been switched to warfarin. Patient was placed on heparin bridge so that the [facility] can be removed. However, patient's AVF was not able to be cannulated on 1/14; therefore, plans have changed to keep the [facility] in for now. Pharmacy has been consulted to resume warfarin dosing and dose heparin. Of note, patient has also had a recent stroke.  Heparin level on drip rate of 950 units/hr was 0.31units/ml, which is within the low goal range for this patient. Per RN, no issues with IV or bleeding observed.  Today, INR remains subtherapeutic at 1.2 after 2.5 warfarin for the last two days. Plts are trending up and now normal at 190. H/H stable.   Goal of Therapy:   Heparin level 0.3-0.5 units/ml due to recent CVA Monitor platelets by anticoagulation protocol: Yes   Plan:  Continue heparin infusion at 950 units/hr Warfarin 4 mg x 1 tonight Monitor daily heparin level, INR, CBC Monitor for signs/symptoms of bleeding  Acey Lav, PharmD  PGY1 Acute Care Pharmacy Resident 08/24/2019,8:11 AM

## 2019-08-25 LAB — RENAL FUNCTION PANEL
Albumin: 2.5 g/dL — ABNORMAL LOW (ref 3.5–5.0)
Anion gap: 13 (ref 5–15)
BUN: 42 mg/dL — ABNORMAL HIGH (ref 8–23)
CO2: 28 mmol/L (ref 22–32)
Calcium: 8.3 mg/dL — ABNORMAL LOW (ref 8.9–10.3)
Chloride: 93 mmol/L — ABNORMAL LOW (ref 98–111)
Creatinine, Ser: 5.59 mg/dL — ABNORMAL HIGH (ref 0.61–1.24)
GFR calc Af Amer: 11 mL/min — ABNORMAL LOW (ref >60)
GFR calc non Af Amer: 10 mL/min — ABNORMAL LOW (ref >60)
Glucose, Bld: 255 mg/dL — ABNORMAL HIGH (ref 70–99)
Phosphorus: 2.6 mg/dL (ref 2.5–4.6)
Potassium: 4.5 mmol/L (ref 3.5–5.1)
Sodium: 134 mmol/L — ABNORMAL LOW (ref 135–145)

## 2019-08-25 LAB — CBC
HCT: 26.1 % — ABNORMAL LOW (ref 39.0–52.0)
Hemoglobin: 8.3 g/dL — ABNORMAL LOW (ref 13.0–17.0)
MCH: 33.9 pg (ref 26.0–34.0)
MCHC: 31.8 g/dL (ref 30.0–36.0)
MCV: 106.5 fL — ABNORMAL HIGH (ref 80.0–100.0)
Platelets: 199 10*3/uL (ref 150–400)
RBC: 2.45 MIL/uL — ABNORMAL LOW (ref 4.22–5.81)
RDW: 17.1 % — ABNORMAL HIGH (ref 11.5–15.5)
WBC: 9.4 10*3/uL (ref 4.0–10.5)
nRBC: 0.4 % — ABNORMAL HIGH (ref 0.0–0.2)

## 2019-08-25 LAB — GLUCOSE, CAPILLARY
Glucose-Capillary: 253 mg/dL — ABNORMAL HIGH (ref 70–99)
Glucose-Capillary: 239 mg/dL — ABNORMAL HIGH (ref 70–99)
Glucose-Capillary: 216 mg/dL — ABNORMAL HIGH (ref 70–99)
Glucose-Capillary: 292 mg/dL — ABNORMAL HIGH (ref 70–99)

## 2019-08-25 LAB — PROTIME-INR
INR: 1.2 (ref 0.8–1.2)
Prothrombin Time: 14.8 seconds (ref 11.4–15.2)

## 2019-08-25 LAB — HEPARIN LEVEL (UNFRACTIONATED): Heparin Unfractionated: 0.32 IU/mL (ref 0.30–0.70)

## 2019-08-25 MED ORDER — WARFARIN SODIUM 4 MG PO TABS
4.0000 mg | ORAL_TABLET | Freq: Once | ORAL | Status: AC
  Administered 2019-08-25: 4 mg via ORAL
  Filled 2019-08-25: qty 1

## 2019-08-25 NOTE — Progress Notes (Addendum)
[facility] Progress Note   Subjective:   Patient seen in room. Feeling well. Denies SOB, cough, CP, dizziness, palpitations, edema, abdominal pain, N/V/D.  Objective Vitals:   08/24/19 1118 08/24/19 1700 08/24/19 2032 08/25/19 0448  BP: (!) 108/52 (!) 110/55 130/62 132/74  Pulse: 88 90 99 99  Resp: 18 18 18 18   Temp: 98.2 F (36.8 C) 98 F (36.7 C) 98 F (36.7 C) 97.8 F (36.6 C)  TempSrc: Oral Oral    SpO2: 98% 96% 97% 95%  Weight:      Height:       Physical Exam General:Well developed, well nourished male. Alert and in NAD Heart:RRR, no murmurs, rubs or gallops Lungs:Lungs CTA bilaterally without wheezing, rhonchi or rales Abdomen:Abdomen soft, non-tender, non-distended. +BS Extremities:No peripheral edema b/l lower extremities Dialysis Access:RUE AVF + thrill/bruit, R IJ TDC without surrounding erythema/edema  Additional Objective Labs: Basic Metabolic Panel: Recent Labs  Lab 08/22/19 0948 08/22/19 2126 08/23/19 0550 08/24/19 0340 08/25/19 0403  NA 132*  --   --  135 134*  K 3.9  --   --  4.1 4.5  CL 95*  --   --  95* 93*  CO2 25  --   --  26 28  GLUCOSE 188*  --   --  253* 255*  BUN 50*  --   --  56* 42*  CREATININE 7.33*  --   --  6.97* 5.59*  CALCIUM 8.0*  --   --  8.2* 8.3*  PHOS 1.2*   < > 2.7 2.1* 2.6   < > = values in this interval not displayed.   Liver Function Tests: Recent Labs  Lab 08/22/19 0948 08/24/19 0340 08/25/19 0403  ALBUMIN 2.6* 2.6* 2.5*   No results for input(s): LIPASE, AMYLASE in the last 168 hours. CBC: Recent Labs  Lab 08/20/19 0728 08/20/19 0728 08/22/19 0949 08/22/19 0949 08/23/19 0550 08/24/19 0340 08/25/19 0403  WBC 5.7   < > 8.2   < > 8.4 9.9 9.4  HGB 8.0*   < > 8.6*   < > 9.3* 8.7* 8.3*  HCT 25.2*   < > 26.5*   < > 28.2* 26.3* 26.1*  MCV 102.9*  --  103.1*  --  100.7* 101.5* 106.5*  PLT 126*   < > 147*   < > 172 190 199   < > = values in this interval not displayed.   Blood  Culture    Component Value Date/Time   SDES BLOOD SITE NOT SPECIFIED 07/30/2019 1843   SPECREQUEST  07/30/2019 1843    BOTTLES DRAWN AEROBIC AND ANAEROBIC Blood Culture results may not be optimal due to an inadequate volume of blood received in culture bottles   CULT  07/30/2019 1843    NO GROWTH 5 DAYS Performed at [facility], [place] [address] [place], [place] [address]    REPTSTATUS 08/04/2019 FINAL 07/30/2019 1843    CBG: Recent Labs  Lab 08/24/19 0359 08/24/19 1107 08/24/19 1559 08/24/19 2029 08/25/19 0701  GLUCAP 249* 159* 185* 324* 253*   Medications: . heparin 1,000 Units/hr (08/24/19 0803)   .  stroke: mapping our early stages of recovery book   Does not apply Once  . allopurinol  100 mg Oral Daily  . aspirin  81 mg Oral Daily  . atorvastatin  40 mg Oral Daily  . Chlorhexidine Gluconate Cloth  6 each Topical Q0600  . darbepoetin (ARANESP) injection - DIALYSIS  100 mcg Intravenous Q  Tue-HD  . feeding supplement (ENSURE ENLIVE)  237 mL Oral TID BM  . feeding supplement (PRO-STAT SUGAR FREE 64)  30 mL Oral BID  . metoCLOPramide  5 mg Oral BID WC  . metoprolol succinate  25 mg Oral Daily  . multivitamin  1 tablet Oral QHS  . pantoprazole  40 mg Oral Daily  . predniSONE  5 mg Oral Q breakfast  . sodium chloride flush  3 mL Intravenous Q12H  . warfarin  4 mg Oral ONCE-1800  . Warfarin - Pharmacist Dosing Inpatient   Does not apply q1800    Dialysis Orders: TTSG/O -[facility] when COVID 19 negative 4 hr 15 min 180NRE 400/800 80.5 kg 2.0 K/ 2.0 Ca R AVF/RIJ TDC -Heparin 8000 units IV TIW -Mircera 75 mcg IV q 2 weeks (last dose 07/25/19) -Venofer 50 mg IV weekly -Parsabiv 7.5 mg IV TIW  Assessment/Plan: 1.Acute COVID-19 pneumonia:s/p remdesivir and Decadron. As per primary team. Now out of 21 day isolation windowbut will still be required to have two negative COVID 19 tests before he can return to [facility]. Repeat test on 1/14 was +. Pt can dialyze at  [facility] until COVID tests negative. 2. ESRD-HD per TTS schedule recently.K+ 4.5. Had been using AVF herewith 16g needles however was not able to cannulate1/14, or 1/16. Using [facility] but good thrill in AVF, attempt to use next HD. Will get [facility] out when he can successfully be cannulated with 15g needles. Abbreviated treatment due to high census.OK for dc from renal standpoint. Awaiting SNF placement.  3. Anemia-Hgb 8.3.Onweekly aranesp 100 mcg IV weekly, increase dose in Hgb trends down further. Missed dose last week. Given 08/20/19. 4. Secondary hyperparathyroidism -Phos1.4 > 2.7 1/15 after IV replacement >> 2.6 this AM.Binders have been discontinued, not on renal diet. Follow daily RFP and replace phos PRN. Parsabiv not on hospital formulary. 5. HTN/volume -BP adequately controlled, no volume overload on exam. Continue metoprolol. Well below outpatient EDW now, lower at discharge. 6. Nutrition -Albumin low. Dys3diet.Continueprostat/renal vit.  7.Acute CVA-On ASA, Coumadin, lipitor.Per primary 8. Dispo - awaiting SNF placement  Anice Paganini, PA-C 08/25/2019, 9:19 AM  [facility] Pager: [phone]  Pt seen, examined and agree w A/P as above.  Kelly Splinter  MD 08/25/2019, 1:31 PM

## 2019-08-25 NOTE — Progress Notes (Signed)
Hospitalist progress note   Jonathan Henschel Sr. 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, DM TY 2, HTN, chronic hepatitis C (treated), MI at age 25, prior stroke Admitted 12/22 swallowing difficulties pocketing food more lethargic-MRI brain showed small infarct in the left pons left occiput neurology saw the patient felt this was secondary to left ventricular thrombus--he was started on heparin He was diagnosed with Covid 07/24/2019--patient has recurrently tested positive but is asymptomatic and this is probably positivity from his index infection in December Data Reviewed:   INR is 1.2, BUN/creatinine 42/5.5, hemoglobin 8.3 and at baseline  Assessment & Plan: COVID-19 pneumonia Resolved --testing + still however--precludes some HD centers Acute strokes left pons left occipital Etiology unclear?  Covid versus LV thrombus Continue aspirin Lipitor Coumadin need skilled facility placement ESRD/HD-hypophos Prior was on MWF-further management per them Need perm access--TDC to continue to be used--needs OP Vasc or IR follow up Renal replacing lytes as prn LV thrombus subtx on Coumadin--bridging with Heparin til INR 2.0--not ready for d/c yet bridging back to coumadin 1/14 Prior cadaveric renal transplant Continue prednisone 5---can probably d/c steroids--defer to to renal  Awaiting disposition to skilled facility--INR needs to be therapeutic but can be transitioned to Lovenox anytime Called wife pat [phone]--no response--left VM  Subjective:  Coherent Again monosyllabic when attempting to engage No reported other issues  Consultants:   renal Procedures:   n Antimicrobials:   n  Senior just want to give somebody an update   Objective: Vitals:   08/24/19 1118 08/24/19 1700 08/24/19 2032 08/25/19 0448  BP: (!) 108/52 (!) 110/55 130/62  132/74  Pulse: 88 90 99 99  Resp: 18 18 18 18   Temp: 98.2 F (36.8 C) 98 F (36.7 C) 98 F (36.7 C) 97.8 F (36.6 C)  TempSrc: Oral Oral    SpO2: 98% 96% 97% 95%  Weight:      Height:        Intake/Output Summary (Last 24 hours) at 08/25/2019 0921 Last data filed at 08/25/2019 0816 Gross per 24 hour  Intake 780 ml  Output 1230 ml  Net -450 ml   Filed Weights   08/23/19 2043 08/24/19 0734 08/24/19 1026  Weight: 72.6 kg 72 kg 71.2 kg    Examination:  eomi ncat no focal deficit cta b no added sound abd soft nt nd no rebound no guard No le edema Neuro intact no focal deficit  Scheduled Meds: .  stroke: mapping our early stages of recovery book   Does not apply Once  . allopurinol  100 mg Oral Daily  . aspirin  81 mg Oral Daily  . atorvastatin  40 mg Oral Daily  . Chlorhexidine Gluconate Cloth  6 each Topical Q0600  . darbepoetin (ARANESP) injection - DIALYSIS  100 mcg Intravenous Q Tue-HD  . feeding supplement (ENSURE ENLIVE)  237 mL Oral TID BM  . feeding supplement (PRO-STAT SUGAR FREE 64)  30 mL Oral BID  . metoCLOPramide  5 mg Oral BID WC  . metoprolol succinate  25 mg Oral Daily  . multivitamin  1 tablet Oral QHS  . pantoprazole  40 mg Oral Daily  . predniSONE  5 mg Oral Q breakfast  . sodium chloride flush  3 mL Intravenous Q12H  . warfarin  4 mg Oral ONCE-1800  . Warfarin - Pharmacist Dosing Inpatient   Does not apply  q1800   Continuous Infusions: . heparin 1,000 Units/hr (08/24/19 0803)     LOS: 26 days   Time spent: [place], MD [facility]  08/25/2019, 9:21 AM

## 2019-08-25 NOTE — Progress Notes (Signed)
ANTICOAGULATION CONSULT NOTE - Follow Up Consult  Pharmacy Consult for Heparin Indication: h/o LV thrombus  No Known Allergies  Patient Measurements: Height: 5\' 11"  (180.3 cm) Weight: 156 lb 15.5 oz (71.2 kg) IBW/kg (Calculated) : 75.3 Heparin Dosing Weight: 71.8 kg  Vital Signs: Temp: 97.8 F (36.6 C) (01/17 0448) BP: 132/74 (01/17 0448) Pulse Rate: 99 (01/17 0448)  Labs: Recent Labs    08/22/19 0948 08/22/19 0949 08/23/19 0550 08/23/19 0550 08/24/19 0340 08/24/19 1404 08/25/19 0403  HGB  --    < > 9.3*   < > 8.7*  --  8.3*  HCT  --    < > 28.2*  --  26.3*  --  26.1*  PLT  --    < > 172  --  190  --  199  LABPROT  --    < > 14.6  --  14.6  --  14.8  INR  --    < > 1.2  --  1.2  --  1.2  HEPARINUNFRC  --    < > 0.41   < > 0.31 0.46 0.32  CREATININE 7.33*  --   --   --  6.97*  --  5.59*   < > = values in this interval not displayed.    Estimated Creatinine Clearance: 13.1 mL/min (A) (by C-G formula based on SCr of 5.59 mg/dL (H)).   Assessment: Pt is a 69 year old male with history of renal transplant in 05/2014 and now with ESRD on HD TTS. Pt has been on Eliquis for left ventricular thombus, which has been switched to warfarin. Patient was placed on heparin bridge so that the [facility] can be removed. However, patient's AVF was not able to be cannulated on 1/14; therefore, plans have changed to keep the [facility] in for now. Pharmacy has been consulted to resume warfarin dosing and dose heparin. Of note, patient has also had a recent stroke.  Heparin level on drip rate of 1000 units/hr was 0.32units/ml, which is within the low goal range for this patient. Per RN, no issues with IV or bleeding observed.  Today, INR remains subtherapeutic at 1.2 after 4 mg warfarin yesterday and 2.5 mg warfarin the prior days. Plts are trending up and now normal at 190. H/H stable.   Goal of Therapy:  Heparin level 0.3-0.5 units/ml due to recent CVA Monitor platelets by anticoagulation protocol:  Yes   Plan:  Continue heparin infusion at 1000 units/hr Warfarin 4 mg x 1 tonight Monitor daily heparin level, INR, CBC Monitor for signs/symptoms of bleeding  Acey Lav, PharmD  PGY1 Acute Care Pharmacy Resident 08/25/2019,7:40 AM

## 2019-08-26 LAB — RENAL FUNCTION PANEL
Albumin: 2.3 g/dL — ABNORMAL LOW (ref 3.5–5.0)
Anion gap: 9 (ref 5–15)
BUN: 63 mg/dL — ABNORMAL HIGH (ref 8–23)
CO2: 28 mmol/L (ref 22–32)
Calcium: 8.3 mg/dL — ABNORMAL LOW (ref 8.9–10.3)
Chloride: 96 mmol/L — ABNORMAL LOW (ref 98–111)
Creatinine, Ser: 7.76 mg/dL — ABNORMAL HIGH (ref 0.61–1.24)
GFR calc Af Amer: 8 mL/min — ABNORMAL LOW (ref >60)
GFR calc non Af Amer: 7 mL/min — ABNORMAL LOW (ref >60)
Glucose, Bld: 189 mg/dL — ABNORMAL HIGH (ref 70–99)
Phosphorus: 3.3 mg/dL (ref 2.5–4.6)
Potassium: 4.6 mmol/L (ref 3.5–5.1)
Sodium: 133 mmol/L — ABNORMAL LOW (ref 135–145)

## 2019-08-26 LAB — CBC
HCT: 24.1 % — ABNORMAL LOW (ref 39.0–52.0)
Hemoglobin: 7.8 g/dL — ABNORMAL LOW (ref 13.0–17.0)
MCH: 33.6 pg (ref 26.0–34.0)
MCHC: 32.4 g/dL (ref 30.0–36.0)
MCV: 103.9 fL — ABNORMAL HIGH (ref 80.0–100.0)
Platelets: 203 10*3/uL (ref 150–400)
RBC: 2.32 MIL/uL — ABNORMAL LOW (ref 4.22–5.81)
RDW: 17 % — ABNORMAL HIGH (ref 11.5–15.5)
WBC: 8.8 10*3/uL (ref 4.0–10.5)
nRBC: 0.2 % (ref 0.0–0.2)

## 2019-08-26 LAB — HEPARIN LEVEL (UNFRACTIONATED)
Heparin Unfractionated: <0.1 IU/mL — ABNORMAL LOW (ref 0.30–0.70)
Heparin Unfractionated: <0.1 IU/mL — ABNORMAL LOW (ref 0.30–0.70)

## 2019-08-26 LAB — GLUCOSE, CAPILLARY
Glucose-Capillary: 170 mg/dL — ABNORMAL HIGH (ref 70–99)
Glucose-Capillary: 227 mg/dL — ABNORMAL HIGH (ref 70–99)
Glucose-Capillary: 257 mg/dL — ABNORMAL HIGH (ref 70–99)
Glucose-Capillary: 198 mg/dL — ABNORMAL HIGH (ref 70–99)

## 2019-08-26 LAB — PROTIME-INR
INR: 1.1 (ref 0.8–1.2)
Prothrombin Time: 14.3 seconds (ref 11.4–15.2)

## 2019-08-26 MED ORDER — DARBEPOETIN ALFA 150 MCG/0.3ML IJ SOSY
150.0000 ug | PREFILLED_SYRINGE | INTRAMUSCULAR | Status: DC
  Filled 2019-08-26 (×4): qty 0.3

## 2019-08-26 MED ORDER — WARFARIN SODIUM 4 MG PO TABS
4.0000 mg | ORAL_TABLET | Freq: Once | ORAL | Status: AC
  Administered 2019-08-26: 4 mg via ORAL
  Filled 2019-08-26: qty 1

## 2019-08-26 NOTE — Progress Notes (Addendum)
ANTICOAGULATION CONSULT NOTE - Follow Up Consult  Pharmacy Consult for Heparin Indication: h/o LV thrombus  No Known Allergies  Patient Measurements: Height: 5\' 11"  (180.3 cm) Weight: 156 lb 15.5 oz (71.2 kg) IBW/kg (Calculated) : 75.3 Heparin Dosing Weight: 71.8 kg  Vital Signs: Temp: 98 F (36.7 C) (01/18 0429) Temp Source: Oral (01/18 0429) BP: 130/68 (01/18 0429) Pulse Rate: 89 (01/18 0429)  Labs: Recent Labs    08/24/19 0340 08/24/19 0340 08/24/19 1404 08/25/19 0403 08/26/19 0431  HGB 8.7*   < >  --  8.3* 7.8*  HCT 26.3*  --   --  26.1* 24.1*  PLT 190  --   --  199 203  LABPROT 14.6  --   --  14.8 14.3  INR 1.2  --   --  1.2 1.1  HEPARINUNFRC 0.31   < > 0.46 0.32 <0.10*  CREATININE 6.97*  --   --  5.59* 7.76*   < > = values in this interval not displayed.    Estimated Creatinine Clearance: 9.4 mL/min (A) (by C-G formula based on SCr of 7.76 mg/dL (H)).   Assessment: Pt is a 69 year old male with history of renal transplant in 05/2014 and now with ESRD on HD TTS. Pt has been on Eliquis for left ventricular thombus, which has been switched to warfarin. Patient was placed on heparin bridge so that the [facility] can be removed. However, patient's AVF was not able to be cannulated on 1/14; therefore, plans have changed to keep the [facility] in for now. Pharmacy has been consulted to resume warfarin dosing and dose heparin. Of note, patient has also had a recent stroke.  Heparin level on drip rate of 1000 units/hr was <0.1 units/ml this AM, which is low for this patient. Per RN, no issues with IV or bleeding observed.  Today, INR remains subtherapeutic at 1.1 after 4 mg warfarin for the last 2 days and 2.5 mg warfarin the prior days. Plts are trending up and now normal at 203.   Goal of Therapy:  Heparin level 0.3-0.5 units/ml due to recent CVA Monitor platelets by anticoagulation protocol: Yes   Plan:  Increase heparin infusion to 1200 units/hr Heparin level in 6  hours Warfarin 4 mg x 1 tonight Monitor daily heparin level, INR, CBC Monitor for signs/symptoms of bleeding  Lonny Eisen A. Levada Dy, PharmD, BCPS, FNKF Clinical Pharmacist [place] Please utilize Amion for appropriate phone number to reach the unit pharmacist ([place])  Addendum: f/u HL was still undetectable. Will ask RN to evaluate IV line and change to alternate site. Will also increase rate to 1450 units/hr (but not bolus due to recent CVA). Heparin level in 8 hours.   Loray Akard A. Levada Dy, PharmD, BCPS, FNKF Clinical Pharmacist [place] Please utilize Amion for appropriate phone number to reach the unit pharmacist ([place])     08/26/2019,8:02 AM

## 2019-08-26 NOTE — Progress Notes (Signed)
Hospitalist progress note  Patient from: home, Expected dispo: SNF , Barrier : Awaiting CLIP and SNF-is Covid + precluding some SNF--need therapeutic INR Jonathan Haarmann Sr. 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, DM TY 2, HTN, chronic hepatitis C (treated), MI at age 45, prior stroke Admitted 12/22 swallowing difficulties pocketing food more lethargic-MRI brain showed small infarct in the left pons left occiput neurology saw the patient felt this was secondary to left ventricular thrombus--he was started on heparin He was diagnosed with Covid 07/24/2019--patient has recurrently tested positive but is asymptomatic and this is probably positivity from his index infection in December Data Reviewed:   INR is 1.2, BUN/creatinine 42/5.5, hemoglobin 8.3 and at baseline  Assessment & Plan: COVID-19 pneumonia Resolved --testing + still however--precludes some HD centers Acute strokes left pons left occipital Etiology unclear?  Covid versus LV thrombus Continue aspirin Lipitor Coumadin need skilled facility placement ESRD/HD-hypophos Prior was on MWF-further management per them Need perm access--TDC to continue to be used--needs OP Vasc or IR follow up Renal replacing lytes as prn LV thrombus subtx on Coumadin--bridging with Heparin til INR 2.0--not ready for d/c yet bridging back to coumadin 1/14 Prior cadaveric renal transplant Continue prednisone 5---can probably d/c steroids--defer to to renal  Called wife pat [phone] on 1/18--no response  Subjective: Laying in bed--eating supine Encouraged him to sit up Monosyllabic and lack of range of expression  Consultants:   renal Procedures:   n  Objective: Vitals:   08/25/19 0958 08/25/19 1621 08/25/19 2045 08/26/19 0429  BP: 140/73 (!) 125/59 (!) 142/74 130/68  Pulse: 86 94 97 89  Resp: 18 18  18 18   Temp: 99.8 F (37.7 C) 98.8 F (37.1 C) 98 F (36.7 C) 98 F (36.7 C)  TempSrc: Oral Oral  Oral  SpO2: 96% 95% 95% 98%  Weight:      Height:        Intake/Output Summary (Last 24 hours) at 08/26/2019 1440 Last data filed at 08/26/2019 0600 Gross per 24 hour  Intake 180 ml  Output 200 ml  Net -20 ml   Filed Weights   08/23/19 2043 08/24/19 0734 08/24/19 1026  Weight: 72.6 kg 72 kg 71.2 kg    Examination:  eomi ncat no focal deficit--no changes cta b no added sound rales or rhonchi abd soft nt nd no rebound no guard No le edema Neuro intact no focal deficit  Scheduled Meds: .  stroke: mapping our early stages of recovery book   Does not apply Once  . allopurinol  100 mg Oral Daily  . aspirin  81 mg Oral Daily  . atorvastatin  40 mg Oral Daily  . Chlorhexidine Gluconate Cloth  6 each Topical Q0600  . [START ON 08/27/2019] darbepoetin (ARANESP) injection - DIALYSIS  150 mcg Intravenous Q Tue-HD  . feeding supplement (ENSURE ENLIVE)  237 mL Oral TID BM  . feeding supplement (PRO-STAT SUGAR FREE 64)  30 mL Oral BID  . metoCLOPramide  5 mg Oral BID WC  . metoprolol succinate  25 mg Oral Daily  . multivitamin  1 tablet Oral QHS  . pantoprazole  40 mg Oral Daily  . predniSONE  5 mg Oral Q breakfast  . sodium chloride flush  3 mL Intravenous Q12H  . warfarin  4 mg Oral ONCE-1800  . Warfarin - Pharmacist Dosing Inpatient   Does not  apply q1800   Continuous Infusions: . heparin 1,200 Units/hr (08/26/19 1154)     LOS: 27 days   Time spent: [place], MD [facility]  08/26/2019, 2:40 PM

## 2019-08-26 NOTE — Progress Notes (Addendum)
I have seen and examined this patient and agree with the plan of care . Covid pneumonia that has been accepted at [facility] unit to complete his recovery before being accepted at [facility] 08/26/2019, 10:20 PM  [facility] Progress Note   Subjective:   Patient seen and examined at bedside.  No specific complaints.  Denies SOB, CP, n/v/d, abdominal pain, weakness, dizziness and fatigue.   Objective Vitals:   08/25/19 0958 08/25/19 1621 08/25/19 2045 08/26/19 0429  BP: 140/73 (!) 125/59 (!) 142/74 130/68  Pulse: 86 94 97 89  Resp: 18 18 18 18   Temp: 99.8 F (37.7 C) 98.8 F (37.1 C) 98 F (36.7 C) 98 F (36.7 C)  TempSrc: Oral Oral  Oral  SpO2: 96% 95% 95% 98%  Weight:      Height:       Physical Exam General:NAD, WDWN male, sitting in bed Heart:RRR no mrg Lungs:CTAB, no wheezing, rales or rhonchi Abdomen:soft, NTND Extremities: no LE edema Dialysis Access: RU AVF +b/t, R IJ [facility]  Filed Weights   08/23/19 2043 08/24/19 0734 08/24/19 1026  Weight: 72.6 kg 72 kg 71.2 kg    Intake/Output Summary (Last 24 hours) at 08/26/2019 1113 Last data filed at 08/26/2019 0600 Gross per 24 hour  Intake 420 ml  Output 200 ml  Net 220 ml    Additional Objective Labs: Basic Metabolic Panel: Recent Labs  Lab 08/24/19 0340 08/25/19 0403 08/26/19 0431  NA 135 134* 133*  K 4.1 4.5 4.6  CL 95* 93* 96*  CO2 26 28 28   GLUCOSE 253* 255* 189*  BUN 56* 42* 63*  CREATININE 6.97* 5.59* 7.76*  CALCIUM 8.2* 8.3* 8.3*  PHOS 2.1* 2.6 3.3   Liver Function Tests: Recent Labs  Lab 08/24/19 0340 08/25/19 0403 08/26/19 0431  ALBUMIN 2.6* 2.5* 2.3*   CBC: Recent Labs  Lab 08/22/19 0949 08/22/19 0949 08/23/19 0550 08/23/19 0550 08/24/19 0340 08/25/19 0403 08/26/19 0431  WBC 8.2   < > 8.4   < > 9.9 9.4 8.8  HGB 8.6*   < > 9.3*   < > 8.7* 8.3* 7.8*  HCT 26.5*   < > 28.2*   < > 26.3* 26.1* 24.1*  MCV 103.1*  --  100.7*  --  101.5* 106.5* 103.9*  PLT 147*    < > 172   < > 190 199 203   < > = values in this interval not displayed.   Blood Culture    Component Value Date/Time   SDES BLOOD SITE NOT SPECIFIED 07/30/2019 1843   SPECREQUEST  07/30/2019 1843    BOTTLES DRAWN AEROBIC AND ANAEROBIC Blood Culture results may not be optimal due to an inadequate volume of blood received in culture bottles   CULT  07/30/2019 1843    NO GROWTH 5 DAYS Performed at [facility], [place] [address], [place], [place] [address]    REPTSTATUS 08/04/2019 FINAL 07/30/2019 1843   CBG: Recent Labs  Lab 08/25/19 1120 08/25/19 1622 08/25/19 2045 08/26/19 0716 08/26/19 1104  GLUCAP 239* 216* 292* 170* 227*    Lab Results  Component Value Date   INR 1.1 08/26/2019   INR 1.2 08/25/2019   INR 1.2 08/24/2019   Medications: . heparin 1,200 Units/hr (08/26/19 0854)   .  stroke: mapping our early stages of recovery book   Does not apply Once  . allopurinol  100 mg Oral Daily  . aspirin  81 mg Oral Daily  .  atorvastatin  40 mg Oral Daily  . Chlorhexidine Gluconate Cloth  6 each Topical Q0600  . darbepoetin (ARANESP) injection - DIALYSIS  100 mcg Intravenous Q Tue-HD  . feeding supplement (ENSURE ENLIVE)  237 mL Oral TID BM  . feeding supplement (PRO-STAT SUGAR FREE 64)  30 mL Oral BID  . metoCLOPramide  5 mg Oral BID WC  . metoprolol succinate  25 mg Oral Daily  . multivitamin  1 tablet Oral QHS  . pantoprazole  40 mg Oral Daily  . predniSONE  5 mg Oral Q breakfast  . sodium chloride flush  3 mL Intravenous Q12H  . warfarin  4 mg Oral ONCE-1800  . Warfarin - Pharmacist Dosing Inpatient   Does not apply q1800    Dialysis Orders: TTSG/O -[facility] when COVID 19 negative 4 hr 15 min 180NRE 400/800 80.5 kg 2.0 K/ 2.0 Ca R AVF/RIJ TDC -Heparin 8000 units IV TIW -Mircera 75 mcg IV q 2 weeks (last dose 07/25/19) -Venofer 50 mg IV weekly -Parsabiv 7.5 mg IV TIW  Assessment/Plan: 1. Acute COVID-19 pneumonia - s/p remdesivir & Decadron.  Per primary.   Now out of 21 day isolation window but still required to have 2 negative COVID 19 test before he can return to [facility].  Repeat test on 1/14 +.  Pt can dialyze at [facility] until COVID tests negative.  2. ESRD - on HD TTS while COVID +.  K 4.6. Had been using AVF here w/16G needles but unable to cannulate 1/14 or 1/16, will attempt again tomorrow.  AVF has good thrill.  Will get [facility] out when can successfully cannulate with 15G needles. Shortened treatment due to high census.  3. Anemia of CKD- Hgb drop 7.8.  Increase Aranesp to 163mcg qwk. Transfuse prn. Check iron studies.  4. Secondary hyperparathyroidism - Phos improved to 3.3, CCa 9.66. No binders or VDRA.  No parsabiv because not on formulary.  5. HTN/volume - Blood pressure well controlled. Does not appear volume overloaded, titrate down volume as tolerated. Well below EDW, will need to lower at d/c.  6. Nutrition - Albumin low. Dys 3 diet. Continue renal vit/prostat.  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 Per primary 8. Acute CVA - etiology unclear. Per primary 9. Dispo - ok to d/c from renal standpoint.  Awaiting SNF placement.   Jen Mow, PA-C [facility] Pager: [phone] 08/26/2019,11:13 AM  LOS: 27 days

## 2019-08-26 NOTE — Progress Notes (Signed)
Physical Therapy Treatment Patient Details Name: Jonathan Naegele Sr. MRN: [id number] DOB: 05-Oct-1950 Today's Date: 08/26/2019    History of Present Illness 69 y.o. 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 Diagnosed with COVID 6 days prior to coming to ED, where chest x-ray showed multifocal pneumonia. Admitted 07/30/19 for treatment of acute encephalopathy, difficulty swallowing and pneumonia.MRI of the brain showed small acute infarcts in the left pons, left occipital lob and right frontal lobe in addition to chronic infarcts     PT Comments    Pt was seen for effort to get to side of bed, noted his struggle with moving and pt agreed to let PT help.  Struggle is more for initiation but not as much about his ability to move legs physically.  When PT began to assist, pt became a bit agitated, asked PT to not touch him.  Conversation about importance of assisting him to move given length of time since pt has been up, but was not productive.  Follow acutely as tolerated for therapy.   Follow Up Recommendations  SNF     Equipment Recommendations  None recommended by PT    Recommendations for Other Services       Precautions / Restrictions Precautions Precautions: Fall Restrictions Weight Bearing Restrictions: No    Mobility  Bed Mobility Overal bed mobility: Needs Assistance       Supine to sit: Total assist     General bed mobility comments: (pt agreed to get OOB and initially moved legs, but then stop)  Transfers                 General transfer comment: refused after agreeing  Ambulation/Gait                 Stairs             Wheelchair Mobility    Modified Rankin (Stroke Patients Only) Modified Rankin (Stroke Patients Only) Pre-Morbid Rankin Score: No  symptoms Modified Rankin: Severe disability     Balance                                            Cognition Arousal/Alertness: Lethargic Behavior During Therapy: Flat affect Overall Cognitive Status: Impaired/Different from baseline Area of Impairment: Problem solving;Awareness;Safety/judgement;Following commands;Memory;Attention;Orientation                 Orientation Level: Situation Current Attention Level: Selective Memory: Decreased recall of precautions;Decreased short-term memory Following Commands: Follows one step commands inconsistently;Follows one step commands with increased time Safety/Judgement: Decreased awareness of safety;Decreased awareness of deficits Awareness: Intellectual Problem Solving: Slow processing;Decreased initiation;Requires verbal cues General Comments: agreed to sit up on side of bed then would not finish the transition      Exercises      General Comments General comments (skin integrity, edema, etc.): pt was on bed, agreed to get OOB and when PT began to assist he became angry and asked PT to leave him in bed and not touch him.  Contradictory statements asking for help then refusing it      Pertinent Vitals/Pain Pain Assessment: Faces Faces Pain Scale: No hurt Pain Intervention(s): Monitored during session    Home Living  Prior Function            PT Goals (current goals can now be found in the care plan section) Acute Rehab PT Goals Patient Stated Goal: none stated Progress towards PT goals: Not progressing toward goals - comment    Frequency    Min 1X/week      PT Plan Discharge plan needs to be updated    Co-evaluation              AM-PAC PT "6 Clicks" Mobility   Outcome Measure  Help needed turning from your back to your side while in a flat bed without using bedrails?: A Lot Help needed moving from lying on your back to sitting on the side of a flat bed  without using bedrails?: A Lot Help needed moving to and from a bed to a chair (including a wheelchair)?: A Lot Help needed standing up from a chair using your arms (e.g., wheelchair or bedside chair)?: A Lot Help needed to walk in hospital room?: A Lot Help needed climbing 3-5 steps with a railing? : Total 6 Click Score: 11    End of Session Equipment Utilized During Treatment: Oxygen Activity Tolerance: Other (comment);Patient limited by fatigue;Patient limited by lethargy Patient left: in bed;with call bell/phone within reach;with bed alarm set Nurse Communication: Mobility status PT Visit Diagnosis: Unsteadiness on feet (R26.81);Other abnormalities of gait and mobility (R26.89);Muscle weakness (generalized) (M62.81);Ataxic gait (R26.0);Difficulty in walking, not elsewhere classified (R26.2)     Time: [id number] PT Time Calculation (min) (ACUTE ONLY): 9 min  Charges:  $Therapeutic Activity: 8-22 mins                    Ramond Dial 08/26/2019, 12:23 PM   Mee Hives, PT MS Acute Rehab Dept. Number: [place]

## 2019-08-27 LAB — RENAL FUNCTION PANEL
Albumin: 2.3 g/dL — ABNORMAL LOW (ref 3.5–5.0)
Anion gap: 12 (ref 5–15)
BUN: 82 mg/dL — ABNORMAL HIGH (ref 8–23)
CO2: 25 mmol/L (ref 22–32)
Calcium: 8.2 mg/dL — ABNORMAL LOW (ref 8.9–10.3)
Chloride: 96 mmol/L — ABNORMAL LOW (ref 98–111)
Creatinine, Ser: 8.75 mg/dL — ABNORMAL HIGH (ref 0.61–1.24)
GFR calc Af Amer: 7 mL/min — ABNORMAL LOW (ref >60)
GFR calc non Af Amer: 6 mL/min — ABNORMAL LOW (ref >60)
Glucose, Bld: 191 mg/dL — ABNORMAL HIGH (ref 70–99)
Phosphorus: 3.9 mg/dL (ref 2.5–4.6)
Potassium: 5.1 mmol/L (ref 3.5–5.1)
Sodium: 133 mmol/L — ABNORMAL LOW (ref 135–145)

## 2019-08-27 LAB — HEPARIN LEVEL (UNFRACTIONATED)
Heparin Unfractionated: 1.04 IU/mL — ABNORMAL HIGH (ref 0.30–0.70)
Heparin Unfractionated: 1.16 IU/mL — ABNORMAL HIGH (ref 0.30–0.70)
Heparin Unfractionated: 1.42 IU/mL — ABNORMAL HIGH (ref 0.30–0.70)

## 2019-08-27 LAB — CBC
HCT: 23.4 % — ABNORMAL LOW (ref 39.0–52.0)
Hemoglobin: 7.6 g/dL — ABNORMAL LOW (ref 13.0–17.0)
MCH: 33.6 pg (ref 26.0–34.0)
MCHC: 32.5 g/dL (ref 30.0–36.0)
MCV: 103.5 fL — ABNORMAL HIGH (ref 80.0–100.0)
Platelets: 207 10*3/uL (ref 150–400)
RBC: 2.26 MIL/uL — ABNORMAL LOW (ref 4.22–5.81)
RDW: 17 % — ABNORMAL HIGH (ref 11.5–15.5)
WBC: 9.1 10*3/uL (ref 4.0–10.5)
nRBC: 0 % (ref 0.0–0.2)

## 2019-08-27 LAB — IRON AND TIBC
Iron: 56 ug/dL (ref 45–182)
Saturation Ratios: 34 % (ref 17.9–39.5)
TIBC: 165 ug/dL — ABNORMAL LOW (ref 250–450)
UIBC: 109 ug/dL

## 2019-08-27 LAB — PROTIME-INR
INR: 1.2 (ref 0.8–1.2)
Prothrombin Time: 14.8 seconds (ref 11.4–15.2)

## 2019-08-27 LAB — GLUCOSE, CAPILLARY
Glucose-Capillary: 107 mg/dL — ABNORMAL HIGH (ref 70–99)
Glucose-Capillary: 157 mg/dL — ABNORMAL HIGH (ref 70–99)
Glucose-Capillary: 224 mg/dL — ABNORMAL HIGH (ref 70–99)
Glucose-Capillary: 245 mg/dL — ABNORMAL HIGH (ref 70–99)
Glucose-Capillary: 251 mg/dL — ABNORMAL HIGH (ref 70–99)

## 2019-08-27 MED ORDER — HEPARIN (PORCINE) 25000 UT/250ML-% IV SOLN
900.0000 [IU]/h | INTRAVENOUS | Status: DC
  Administered 2019-08-27: 950 [IU]/h via INTRAVENOUS
  Administered 2019-08-28 – 2019-08-29 (×2): 850 [IU]/h via INTRAVENOUS
  Administered 2019-08-30: 800 [IU]/h via INTRAVENOUS
  Administered 2019-08-31: 1000 [IU]/h via INTRAVENOUS
  Administered 2019-09-02 – 2019-09-04 (×3): 900 [IU]/h via INTRAVENOUS
  Filled 2019-08-27 (×7): qty 250

## 2019-08-27 MED ORDER — WARFARIN SODIUM 4 MG PO TABS
4.0000 mg | ORAL_TABLET | Freq: Once | ORAL | Status: AC
  Administered 2019-08-27: 4 mg via ORAL
  Filled 2019-08-27: qty 1

## 2019-08-27 MED ORDER — DARBEPOETIN ALFA 150 MCG/0.3ML IJ SOSY
PREFILLED_SYRINGE | INTRAMUSCULAR | Status: AC
  Administered 2019-08-27: 150 ug via INTRAVENOUS
  Filled 2019-08-27: qty 0.3

## 2019-08-27 MED ORDER — HEPARIN (PORCINE) 25000 UT/250ML-% IV SOLN
1250.0000 [IU]/h | INTRAVENOUS | Status: DC
  Administered 2019-08-27: 1250 [IU]/h via INTRAVENOUS
  Filled 2019-08-27: qty 250

## 2019-08-27 NOTE — Progress Notes (Addendum)
ANTICOAGULATION CONSULT NOTE - Follow Up Consult  Pharmacy Consult for Heparin Indication: h/o LV thrombus  No Known Allergies  Patient Measurements: Height: 5\' 11"  (180.3 cm) Weight: 153 lb 14.1 oz (69.8 kg) IBW/kg (Calculated) : 75.3 Heparin Dosing Weight: 71.8 kg  Vital Signs: Temp: 97.9 F (36.6 C) (01/19 1056) Temp Source: Oral (01/19 1056) BP: 133/27 (01/19 1306) Pulse Rate: 89 (01/19 1306)  Labs: Recent Labs    08/25/19 0403 08/25/19 0403 08/26/19 0431 08/26/19 1452 08/27/19 0000 08/27/19 0223 08/27/19 1339  HGB 8.3*   < > 7.8*  --  7.6*  --   --   HCT 26.1*  --  24.1*  --  23.4*  --   --   PLT 199  --  203  --  207  --   --   LABPROT 14.8  --  14.3  --  14.8  --   --   INR 1.2  --  1.1  --  1.2  --   --   HEPARINUNFRC 0.32   < > <0.10*   < > 1.04* 1.16* 1.42*  CREATININE 5.59*  --  7.76*  --  8.75*  --   --    < > = values in this interval not displayed.    Estimated Creatinine Clearance: 8.2 mL/min (A) (by C-G formula based on SCr of 8.75 mg/dL (H)).   Assessment: Pt is a 69 year old male with history of renal transplant in 05/2014 and now with ESRD on HD TTS. Pt has been on Eliquis for left ventricular thombus, which has been switched to warfarin. Patient was placed on heparin bridge so that the [facility] can be removed. However, patient's AVF was not able to be cannulated on 1/14; therefore, plans have changed to keep the [facility] in for now. Pharmacy has been consulted to resume warfarin dosing and dose heparin. Of note, patient has also had a recent stroke.  Heparin level still supratherapeutic and INR remains subtherapeutic at 1.2 after 4 mg warfarin for the last 2 days. Plts are trending up and now normal at 203.   Goal of Therapy:  Heparin level 0.3-0.5 units/ml due to recent CVA Monitor platelets by anticoagulation protocol: Yes   Plan:  Hold heparin for 2 hours Decrease heparin infusion to 950 units/hr Heparin level in 8 hours Warfarin 4 mg x 1  tonight Monitor daily heparin level, INR, CBC Monitor for signs/symptoms of bleeding  Shamirah Ivan A. Levada Dy, PharmD, BCPS, FNKF Clinical Pharmacist [place] Please utilize Amion for appropriate phone number to reach the unit pharmacist ([place])      08/27/2019,2:35 PM

## 2019-08-27 NOTE — Progress Notes (Signed)
[facility] Progress Note   Subjective:   Patient seen and examined at bedside in dialysis.  Denies melena, hematochezia or any bleeding. No specific complaints.   Objective Vitals:   08/27/19 0900 08/27/19 0930 08/27/19 1000 08/27/19 1030  BP: 116/65 (!) 113/56 112/62 (!) 81/47  Pulse: 88 89 90 89  Resp:      Temp:      TempSrc:      SpO2:      Weight:      Height:       Physical Exam General:NAD, WDWN male, laying in bed Heart:RRR Lungs:CTAB anteriorly  Abdomen:soft, NTND Extremities: no LE edema Dialysis Access: RU AVF cannulated   Filed Weights   08/24/19 0734 08/24/19 1026 08/27/19 0750  Weight: 72 kg 71.2 kg 71.4 kg    Intake/Output Summary (Last 24 hours) at 08/27/2019 1041 Last data filed at 08/27/2019 0742 Gross per 24 hour  Intake 797.57 ml  Output 151 ml  Net 646.57 ml    Additional Objective Labs: Basic Metabolic Panel: Recent Labs  Lab 08/25/19 0403 08/26/19 0431 08/27/19 0000  NA 134* 133* 133*  K 4.5 4.6 5.1  CL 93* 96* 96*  CO2 28 28 25   GLUCOSE 255* 189* 191*  BUN 42* 63* 82*  CREATININE 5.59* 7.76* 8.75*  CALCIUM 8.3* 8.3* 8.2*  PHOS 2.6 3.3 3.9   Liver Function Tests: Recent Labs  Lab 08/25/19 0403 08/26/19 0431 08/27/19 0000  ALBUMIN 2.5* 2.3* 2.3*   CBC: Recent Labs  Lab 08/23/19 0550 08/23/19 0550 08/24/19 0340 08/24/19 0340 08/25/19 0403 08/26/19 0431 08/27/19 0000  WBC 8.4   < > 9.9   < > 9.4 8.8 9.1  HGB 9.3*   < > 8.7*   < > 8.3* 7.8* 7.6*  HCT 28.2*   < > 26.3*   < > 26.1* 24.1* 23.4*  MCV 100.7*  --  101.5*  --  106.5* 103.9* 103.5*  PLT 172   < > 190   < > 199 203 207   < > = values in this interval not displayed.   Blood Culture    Component Value Date/Time   SDES BLOOD SITE NOT SPECIFIED 07/30/2019 1843   SPECREQUEST  07/30/2019 1843    BOTTLES DRAWN AEROBIC AND ANAEROBIC Blood Culture results may not be optimal due to an inadequate volume of blood received in culture bottles   CULT   07/30/2019 1843    NO GROWTH 5 DAYS Performed at [facility], [place] [address], [place],  [address]    REPTSTATUS 08/04/2019 FINAL 07/30/2019 1843   CBG: Recent Labs  Lab 08/26/19 0716 08/26/19 1104 08/26/19 1619 08/26/19 2149 08/27/19 0648  GLUCAP 170* 227* 257* 198* 157*   Iron Studies:  Recent Labs    08/27/19 0000  IRON 56  TIBC 165*   Lab Results  Component Value Date   INR 1.2 08/27/2019   INR 1.1 08/26/2019   INR 1.2 08/25/2019   Studies/Results: No results found.  Medications: . heparin 1,250 Units/hr (08/27/19 0742)   .  stroke: mapping our early stages of recovery book   Does not apply Once  . allopurinol  100 mg Oral Daily  . aspirin  81 mg Oral Daily  . atorvastatin  40 mg Oral Daily  . Chlorhexidine Gluconate Cloth  6 each Topical Q0600  . darbepoetin (ARANESP) injection - DIALYSIS  150 mcg Intravenous Q Tue-HD  . feeding supplement (ENSURE ENLIVE)  237 mL Oral TID  BM  . feeding supplement (PRO-STAT SUGAR FREE 64)  30 mL Oral BID  . metoCLOPramide  5 mg Oral BID WC  . metoprolol succinate  25 mg Oral Daily  . multivitamin  1 tablet Oral QHS  . pantoprazole  40 mg Oral Daily  . predniSONE  5 mg Oral Q breakfast  . sodium chloride flush  3 mL Intravenous Q12H  . Warfarin - Pharmacist Dosing Inpatient   Does not apply q1800    Dialysis Orders: TTSG/O -[facility] when COVID 19 negative 4 hr 15 min 180NRE 400/800 80.5 kg 2.0 K/ 2.0 Ca R AVF/RIJ TDC -Heparin 8000 units IV TIW -Mircera 75 mcg IV q 2 weeks (last dose 07/25/19) -Venofer 50 mg IV weekly -Parsabiv 7.5 mg IV TIW  Assessment/Plan: 1. Acute COVID-19 pneumonia - s/p remdesivir & Decadron.  Per primary.  Now out of 21 day isolation window but still required to have 2 negative COVID 19 test before he can return to [facility].  Repeat test on 1/14 +.  Pt can dialyze at [facility] until COVID tests negative.  2. ESRD - on HD TTS while COVID +.  K 4.6. HD today. Had been using AVF here w/16G  needles but unable to cannulate 1/14 or 1/16, will attempt again tomorrow.  AVF has good thrill.   Tolerating 15G needles today, if successful again on Thurs will have TDC removed. Shortened treatment due to high census.  3. Anemia of CKD- Hgb drop 7.8, 7.6. Denies bleeding.  Increase Aranesp to 160mcg qwk. Transfuse prn. tsat 34%.   4. Secondary hyperparathyroidism - Phos improved to 3.9, CCa 9.6. No binders or VDRA.  No parsabiv because not on formulary.  5. HTN/volume - Blood pressure well controlled. Does not appear volume overloaded, titrate down volume as tolerated. Well below EDW, will need to lower at d/c.  6. Nutrition - Albumin low. Dys 3 diet. Continue renal vit/prostat.  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 Per primary 8. Acute CVA - etiology unclear. Per primary 9. Dispo - ok to d/c from renal standpoint.  Awaiting SNF placement.  Jen Mow, PA-C [facility] Pager: [phone] 08/27/2019,10:41 AM  LOS: 28 days

## 2019-08-27 NOTE — Progress Notes (Signed)
ANTICOAGULATION CONSULT NOTE - Follow Up Consult  Pharmacy Consult for Heparin Indication: h/o LV thrombus  No Known Allergies  Patient Measurements: Height: 5\' 11"  (180.3 cm) Weight: 156 lb 15.5 oz (71.2 kg) IBW/kg (Calculated) : 75.3 Heparin Dosing Weight: 71.8 kg  Vital Signs: Temp: 97.7 F (36.5 C) (01/18 2157) Temp Source: Oral (01/18 2157) BP: 129/60 (01/18 2157) Pulse Rate: 80 (01/18 2157)  Labs: Recent Labs    08/25/19 0403 08/25/19 0403 08/26/19 0431 08/26/19 0431 08/26/19 1452 08/27/19 0000 08/27/19 0223  HGB 8.3*   < > 7.8*  --   --  7.6*  --   HCT 26.1*  --  24.1*  --   --  23.4*  --   PLT 199  --  203  --   --  207  --   LABPROT 14.8  --  14.3  --   --  14.8  --   INR 1.2  --  1.1  --   --  1.2  --   HEPARINUNFRC 0.32   < > <0.10*   < > <0.10* 1.04* 1.16*  CREATININE 5.59*  --  7.76*  --   --  8.75*  --    < > = values in this interval not displayed.    Estimated Creatinine Clearance: 8.4 mL/min (A) (by C-G formula based on SCr of 8.75 mg/dL (H)).   Assessment: Pt is a 70 year old male with history of renal transplant in 05/2014 and now with ESRD on HD TTS. Pt has been on Eliquis for left ventricular thombus, which has been switched to warfarin. Patient was placed on heparin bridge so that the [facility] can be removed. However, patient's AVF was not able to be cannulated on 1/14; therefore, plans have changed to keep the [facility] in for now. Pharmacy has been consulted to resume warfarin dosing and dose heparin. Of note, patient has also had a recent stroke.  Heparin level on drip rate of 1000 units/hr was <0.1 units/ml this AM, which is low for this patient. Per RN, no issues with IV or bleeding observed.  Today, INR remains subtherapeutic at 1.1 after 4 mg warfarin for the last 2 days and 2.5 mg warfarin the prior days. Plts are trending up and now normal at 203.  1/19 AM update:  Heparin level now elevated Confirmed with re-draw No issues per RN   Goal of  Therapy:  Heparin level 0.3-0.5 units/ml due to recent CVA Monitor platelets by anticoagulation protocol: Yes   Plan:  Hold heparin x 1 hr Re-start heparin drip at 1250 units/hr at 0445 Re-check heparin level at [place], PharmD, [place] Pharmacist Phone: [phone]

## 2019-08-27 NOTE — Progress Notes (Signed)
Hospitalist progress note  Patient from: home, Expected dispo: SNF , Barrier : Awaiting CLIP and SNF-is Covid + precluding some SNF--need therapeutic INR Jonathan Roosevelt Sr. 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, DM TY 2, HTN, chronic hepatitis C (treated), MI at age 58, prior stroke Admitted 12/22 swallowing difficulties pocketing food more lethargic-MRI brain showed small infarct in the left pons left occiput neurology saw the patient felt this was secondary to left ventricular thrombus--he was started on heparin He was diagnosed with Covid 07/24/2019--patient has recurrently tested positive but is asymptomatic and this is probably positivity from his index infection in December Data Reviewed:   INR is 1.2, BUN/creatinine 82/8.75  hemoglobin 7.6 and at baseline  Assessment & Plan: COVID-19 pneumonia Resolved --testing + still however--precludes some HD centers Acute strokes left pons left occipital Etiology unclear?  Covid versus LV thrombus Continue aspirin Lipitor Coumadin need skilled facility placement-pending for last several days ESRD/HD-hypophos Prior was on MWF-further management per them Need perm access--TDC to continue to be used--needs OP Vasc or IR follow up Renal replacing lytes as prn LV thrombus subtx on Coumadin--bridging with Heparin til INR 2.0--not ready for d/c yet bridging back to coumadin 1/14 Prior cadaveric renal transplant Continue prednisone 5---can probably d/c steroids--defer to to renal  Called wife pat [phone] on 1/18--no response--have tried number several times  Subjective: No new issues Not willing to get up  Consultants:   renal Procedures:   n  Objective: Vitals:   08/27/19 1000 08/27/19 1030 08/27/19 1056 08/27/19 1306  BP: 112/62 (!) 81/47 (!) 115/40 (!) 133/27  Pulse: 90 89 87 89  Resp:    18   Temp:   97.9 F (36.6 C)   TempSrc:   Oral   SpO2:   98%   Weight:   69.8 kg   Height:        Intake/Output Summary (Last 24 hours) at 08/27/2019 1510 Last data filed at 08/27/2019 1300 Gross per 24 hour  Intake 893.53 ml  Output 2073 ml  Net -1179.47 ml   Filed Weights   08/24/19 1026 08/27/19 0750 08/27/19 1056  Weight: 71.2 kg 71.4 kg 69.8 kg    Examination:  eomi ncat no focal deficit  Monosyllabic cta b No le edema  s1 s 2no m strn intact-poor effort--some malnourished  Scheduled Meds: .  stroke: mapping our early stages of recovery book   Does not apply Once  . allopurinol  100 mg Oral Daily  . aspirin  81 mg Oral Daily  . atorvastatin  40 mg Oral Daily  . Chlorhexidine Gluconate Cloth  6 each Topical Q0600  . darbepoetin (ARANESP) injection - DIALYSIS  150 mcg Intravenous Q Tue-HD  . feeding supplement (ENSURE ENLIVE)  237 mL Oral TID BM  . feeding supplement (PRO-STAT SUGAR FREE 64)  30 mL Oral BID  . metoCLOPramide  5 mg Oral BID WC  . metoprolol succinate  25 mg Oral Daily  . multivitamin  1 tablet Oral QHS  . pantoprazole  40 mg Oral Daily  . predniSONE  5 mg Oral Q breakfast  . sodium chloride flush  3 mL Intravenous Q12H  . warfarin  4 mg Oral ONCE-1800  . Warfarin - Pharmacist Dosing Inpatient   Does not apply q1800   Continuous Infusions: . heparin       LOS: 28 days  Time spent: [place], MD [facility]  08/27/2019, 3:10 PM

## 2019-08-27 NOTE — Plan of Care (Signed)
  Problem: Self-Care: Goal: Ability to participate in self-care as condition permits will improve Outcome: Progressing   Problem: Nutrition: Goal: Risk of aspiration will decrease Outcome: Progressing   

## 2019-08-27 NOTE — Progress Notes (Signed)
SLP Cancellation Note  Patient Details Name: Jonathan Marple Sr. MRN: [id number] DOB: [birth year]   Cancelled treatment:       Reason Eval/Treat Not Completed: Patient declined, no reason specified. After introducing myself, pt stated "I don't want to talk to you". I encouraged participation and explained why I was there, and asked why he didn't want to talk to me. Pt stated twice more "I don't want to talk to you". Session terminated to avoid escalation.  Shawnice Tilmon B. Quentin Ore, [facility], [place] Speech Language Pathologist Office: [phone] Pager: [phone]   Shonna Chock 08/27/2019, 2:19 PM

## 2019-08-28 LAB — RENAL FUNCTION PANEL
Albumin: 2.3 g/dL — ABNORMAL LOW (ref 3.5–5.0)
Anion gap: 9 (ref 5–15)
BUN: 50 mg/dL — ABNORMAL HIGH (ref 8–23)
CO2: 25 mmol/L (ref 22–32)
Calcium: 7.8 mg/dL — ABNORMAL LOW (ref 8.9–10.3)
Chloride: 100 mmol/L (ref 98–111)
Creatinine, Ser: 6.1 mg/dL — ABNORMAL HIGH (ref 0.61–1.24)
GFR calc Af Amer: 10 mL/min — ABNORMAL LOW (ref >60)
GFR calc non Af Amer: 9 mL/min — ABNORMAL LOW (ref >60)
Glucose, Bld: 195 mg/dL — ABNORMAL HIGH (ref 70–99)
Phosphorus: 2.5 mg/dL (ref 2.5–4.6)
Potassium: 5.2 mmol/L — ABNORMAL HIGH (ref 3.5–5.1)
Sodium: 134 mmol/L — ABNORMAL LOW (ref 135–145)

## 2019-08-28 LAB — CBC
HCT: 25.5 % — ABNORMAL LOW (ref 39.0–52.0)
Hemoglobin: 8.1 g/dL — ABNORMAL LOW (ref 13.0–17.0)
MCH: 34 pg (ref 26.0–34.0)
MCHC: 31.8 g/dL (ref 30.0–36.0)
MCV: 107.1 fL — ABNORMAL HIGH (ref 80.0–100.0)
Platelets: 238 10*3/uL (ref 150–400)
RBC: 2.38 MIL/uL — ABNORMAL LOW (ref 4.22–5.81)
RDW: 17.8 % — ABNORMAL HIGH (ref 11.5–15.5)
WBC: 8.9 10*3/uL (ref 4.0–10.5)
nRBC: 0.3 % — ABNORMAL HIGH (ref 0.0–0.2)

## 2019-08-28 LAB — GLUCOSE, CAPILLARY
Glucose-Capillary: 171 mg/dL — ABNORMAL HIGH (ref 70–99)
Glucose-Capillary: 150 mg/dL — ABNORMAL HIGH (ref 70–99)
Glucose-Capillary: 207 mg/dL — ABNORMAL HIGH (ref 70–99)
Glucose-Capillary: 172 mg/dL — ABNORMAL HIGH (ref 70–99)

## 2019-08-28 LAB — PROTIME-INR
INR: 1.2 (ref 0.8–1.2)
Prothrombin Time: 14.7 seconds (ref 11.4–15.2)

## 2019-08-28 LAB — HEPARIN LEVEL (UNFRACTIONATED): Heparin Unfractionated: 0.65 IU/mL (ref 0.30–0.70)

## 2019-08-28 MED ORDER — SODIUM ZIRCONIUM CYCLOSILICATE 10 G PO PACK
10.0000 g | PACK | Freq: Once | ORAL | Status: AC
  Administered 2019-08-28: 10 g via ORAL
  Filled 2019-08-28: qty 1

## 2019-08-28 MED ORDER — CHLORHEXIDINE GLUCONATE CLOTH 2 % EX PADS
6.0000 | MEDICATED_PAD | Freq: Every day | CUTANEOUS | Status: DC
  Administered 2019-08-29 – 2019-09-13 (×7): 6 via TOPICAL

## 2019-08-28 MED ORDER — WARFARIN SODIUM 5 MG PO TABS
5.0000 mg | ORAL_TABLET | Freq: Once | ORAL | Status: AC
  Administered 2019-08-28: 5 mg via ORAL
  Filled 2019-08-28: qty 1

## 2019-08-28 NOTE — Progress Notes (Signed)
Jonathan [name] Progress Note   Subjective:   Seen and examined at bedside.  No new complaints. Says "I feel ok, I dont want you to listen to my heart and lungs today."    Objective Vitals:   08/27/19 1807 08/27/19 2043 08/28/19 0423 08/28/19 0840  BP: (!) 135/56 (!) 131/57 129/65 (!) 117/51  Pulse: 93 96 95 93  Resp: 18 16 16 12   Temp: 97.9 F (36.6 C) 98.7 F (37.1 C) 99 F (37.2 C) 97.6 F (36.4 C)  TempSrc: Oral Oral Oral Oral  SpO2: 96% 99% 92% 96%  Weight:      Height:       Physical Exam  General:NAD, WDWN male, laying in bed Heart:refused auscultation Lungs:normal WOB, refused auscultation  Extremities:no LE edema Dialysis Access: RU AVF cannulated    Filed Weights   08/24/19 1026 08/27/19 0750 08/27/19 1056  Weight: 71.2 kg 71.4 kg 69.8 kg    Intake/Output Summary (Last 24 hours) at 08/28/2019 1134 Last data filed at 08/28/2019 1000 Gross per 24 hour  Intake 1152.11 ml  Output 4 ml  Net 1148.11 ml    Additional Objective Labs: Basic Metabolic Panel: Recent Labs  Lab 08/26/19 0431 08/27/19 0000 08/28/19 0242  NA 133* 133* 134*  K 4.6 5.1 5.2*  CL 96* 96* 100  CO2 28 25 25   GLUCOSE 189* 191* 195*  BUN 63* 82* 50*  CREATININE 7.76* 8.75* 6.10*  CALCIUM 8.3* 8.2* 7.8*  PHOS 3.3 3.9 2.5   Liver Function Tests: Recent Labs  Lab 08/26/19 0431 08/27/19 0000 08/28/19 0242  ALBUMIN 2.3* 2.3* 2.3*   CBC: Recent Labs  Lab 08/24/19 0340 08/24/19 0340 08/25/19 0403 08/25/19 0403 08/26/19 0431 08/27/19 0000 08/28/19 0242  WBC 9.9   < > 9.4   < > 8.8 9.1 8.9  HGB 8.7*   < > 8.3*   < > 7.8* 7.6* 8.1*  HCT 26.3*   < > 26.1*   < > 24.1* 23.4* 25.5*  MCV 101.5*  --  106.5*  --  103.9* 103.5* 107.1*  PLT 190   < > 199   < > 203 207 238   < > = values in this interval not displayed.   Blood Culture    Component Value Date/Time   SDES BLOOD SITE NOT SPECIFIED 07/30/2019 1843   SPECREQUEST  07/30/2019 1843    BOTTLES DRAWN AEROBIC  AND ANAEROBIC Blood Culture results may not be optimal due to an inadequate volume of blood received in culture bottles   CULT  07/30/2019 1843    NO GROWTH 5 DAYS Performed at [facility], [place] [address] [place], [place] [address]    REPTSTATUS 08/04/2019 FINAL 07/30/2019 1843    Cardiac Enzymes: No results for input(s): CKTOTAL, CKMB, CKMBINDEX, TROPONINI in the last 168 hours. CBG: Recent Labs  Lab 08/27/19 1617 08/27/19 2126 08/27/19 2355 08/28/19 0424 08/28/19 0723  GLUCAP 245* 251* 224* 171* 150*   Iron Studies:  Recent Labs    08/27/19 0000  IRON 56  TIBC 165*   Lab Results  Component Value Date   INR 1.2 08/28/2019   INR 1.2 08/27/2019   INR 1.1 08/26/2019   Studies/Results: No results found.  Medications: . heparin 850 Units/hr (08/28/19 0714)   .  stroke: mapping our early stages of recovery book   Does not apply Once  . allopurinol  100 mg Oral Daily  . aspirin  81 mg Oral Daily  . atorvastatin  40 mg Oral Daily  . Chlorhexidine Gluconate Cloth  6 each Topical Q0600  . darbepoetin (ARANESP) injection - DIALYSIS  150 mcg Intravenous Q Tue-HD  . feeding supplement (ENSURE ENLIVE)  237 mL Oral TID BM  . feeding supplement (PRO-STAT SUGAR FREE 64)  30 mL Oral BID  . metoCLOPramide  5 mg Oral BID WC  . metoprolol succinate  25 mg Oral Daily  . multivitamin  1 tablet Oral QHS  . pantoprazole  40 mg Oral Daily  . predniSONE  5 mg Oral Q breakfast  . sodium chloride flush  3 mL Intravenous Q12H  . Warfarin - Pharmacist Dosing Inpatient   Does not apply q1800    Dialysis Orders: TTSG/O -[facility] when COVID 19 negative 4 hr 15 min 180NRE 400/800 80.5 kg 2.0 K/ 2.0 Ca R AVF/RIJ TDC -Heparin 8000 units IV TIW -Mircera 75 mcg IV q 2 weeks (last dose 07/25/19) -Venofer 50 mg IV weekly -Parsabiv 7.5 mg IV TIW  Assessment/Plan: 1.Acute COVID-19 pneumonia - s/p remdesivir &Decadron. Per primary. Now out of 21 day isolation window but still  required to have 2 negative COVID 19 test before he can return to [facility]. Repeat test on 1/14 +. Pt can dialyze at [facility] until COVID tests negative.  2.ESRD- on HD TTS while COVID +. K 5.2 this AM, will give dose lokelma today. Plan for HD tomorrow. Had been using AVF here w/16G needles but unable to cannulate 1/14 or 1/16. Tolerated 15G needles yesterday, if successful again on Thurs will have TDC removed. Shortened treatment due to high census. Orders written.  3. Anemiaof CKD-Hgb improving, now 8.1. Denies bleeding. Increased Aranesp to 114mcg qwk. Transfuse prn. tsat 34%.  4. Secondary hyperparathyroidism- Phos improved to 2.5, CCa 9.2. No binders or VDRA. No parsabiv because not on formulary.  5.HTN/volume- Blood pressure well controlled. Does not appear volume overloaded, titrate down volume as tolerated. Well below EDW, will need to lower at d/c ~71kg.  6. Nutrition- Albumin low. Dys 3 diet. Continue renal vit/prostat.  7.LV thrombus- on coumadin. Per primary 8.Acute CVA- etiology unclear. Per primary 9. Dispo -ok to d/c from renal standpoint. Awaiting SNF placement.  Jonathan Mow, PA-C [name] Pager: [phone] 08/28/2019,11:34 AM  LOS: 29 days

## 2019-08-28 NOTE — Progress Notes (Signed)
ANTICOAGULATION CONSULT NOTE - Follow Up Consult  Pharmacy Consult for Heparin Indication: h/o LV thrombus  No Known Allergies  Patient Measurements: Height: 5\' 11"  (180.3 cm) Weight: 153 lb 14.1 oz (69.8 kg) IBW/kg (Calculated) : 75.3 Heparin Dosing Weight: 71.8 kg  Vital Signs: Temp: 98.7 F (37.1 C) (01/19 2043) Temp Source: Oral (01/19 2043) BP: 131/57 (01/19 2043) Pulse Rate: 96 (01/19 2043)  Labs: Recent Labs    08/25/19 0403 08/25/19 0403 08/26/19 0431 08/26/19 1452 08/27/19 0000 08/27/19 0000 08/27/19 0223 08/27/19 1339 08/28/19 0242  HGB 8.3*   < > 7.8*  --  7.6*  --   --   --  8.1*  HCT 26.1*   < > 24.1*  --  23.4*  --   --   --  25.5*  PLT 199   < > 203  --  207  --   --   --  238  LABPROT 14.8  --  14.3  --  14.8  --   --   --   --   INR 1.2  --  1.1  --  1.2  --   --   --   --   HEPARINUNFRC 0.32   < > <0.10*   < > 1.04*   < > 1.16* 1.42* 0.65  CREATININE 5.59*   < > 7.76*  --  8.75*  --   --   --  6.10*   < > = values in this interval not displayed.    Estimated Creatinine Clearance: 11.8 mL/min (A) (by C-G formula based on SCr of 6.1 mg/dL (H)).   Assessment: Pt is a 69 year old male with history of renal transplant in 05/2014 and now with ESRD on HD TTS. Pt has been on Eliquis for left ventricular thombus, which has been switched to warfarin. Patient was placed on heparin bridge so that the [facility] can be removed. However, patient's AVF was not able to be cannulated on 1/14; therefore, plans have changed to keep the [facility] in for now. Pharmacy has been consulted to resume warfarin dosing and dose heparin. Of note, patient has also had a recent stroke.  Heparin level on drip rate of 1000 units/hr was <0.1 units/ml this AM, which is low for this patient. Per RN, no issues with IV or bleeding observed.  Today, INR remains subtherapeutic at 1.1 after 4 mg warfarin for the last 2 days and 2.5 mg warfarin the prior days. Plts are trending up and now normal at  203.  1/20 AM update:  Heparin level just above goal No issues per RN   Goal of Therapy:  Heparin level 0.3-0.5 units/ml due to recent CVA Monitor platelets by anticoagulation protocol: Yes   Plan:  Dec heparin to 850 units/hr 1200 heparin level  Narda Bonds, PharmD, BCPS Clinical Pharmacist Phone: [phone]

## 2019-08-28 NOTE — Progress Notes (Signed)
Physical Therapy Treatment Patient Details Name: Jonathan [name]. MRN: [id number] DOB: 1950/10/09 Today's Date: 08/28/2019    History of Present Illness 69 y.o. 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 Diagnosed with COVID 6 days prior to coming to ED, where chest x-ray showed multifocal pneumonia. Admitted 07/30/19 for treatment of acute encephalopathy, difficulty swallowing and pneumonia.MRI of the brain showed small acute infarcts in the left pons, left occipital lob and right frontal lobe in addition to chronic infarcts     PT Comments    Pt was on edge of chair when PT arrived, had call light on and bag from catheter was on his tray table.  He asked to return to bed, and requires dense cues for safety with lines.  Once returned safely to bed PT asked him about further therapy and he became stern, asking PT why she was there.  Spoke with CNA to share the status of cath being off, and pt was in bed with call light in reach, bed alarm on.  Follow acutely to try for gait on RW or with HHA using 2 person assist as pt will allow.     Follow Up Recommendations  SNF     Equipment Recommendations  None recommended by PT    Recommendations for Other Services       Precautions / Restrictions Precautions Precautions: Fall Restrictions Weight Bearing Restrictions: No    Mobility  Bed Mobility Overal bed mobility: Needs Assistance Bed Mobility: Sit to Supine       Sit to supine: Min guard   General bed mobility comments: prompts to reposition on the bed so pt is not losing balance to one side  Transfers Overall transfer level: Needs assistance Equipment used: None;1 person hand held assist Transfers: Sit to/from [facility] Sit to Stand: Mod assist Stand pivot transfers: Mod  assist       General transfer comment: mod assist and pt does not follow instructions that endanger his lines, not understanding the reason to protect them  Ambulation/Gait             General Gait Details: steps to transfer only   Stairs             Wheelchair Mobility    Modified Rankin (Stroke Patients Only)       Balance Overall balance assessment: Needs assistance Sitting-balance support: Feet supported Sitting balance-Leahy Scale: Fair     Standing balance support: Bilateral upper extremity supported;During functional activity Standing balance-Leahy Scale: Poor Standing balance comment: mod A from therapist for balance                            Cognition Arousal/Alertness: Awake/alert Behavior During Therapy: Flat affect Overall Cognitive Status: Impaired/Different from baseline Area of Impairment: Orientation;Attention;Memory;Following commands;Safety/judgement;Awareness;Problem solving                 Orientation Level: Situation;Time Current Attention Level: Selective Memory: Decreased recall of precautions;Decreased short-term memory Following Commands: Follows one step commands inconsistently;Follows one step commands with increased time Safety/Judgement: Decreased awareness of safety;Decreased awareness of deficits Awareness: Intellectual Problem Solving: Slow processing;Requires verbal cues;Requires tactile cues General Comments: pt has to be refocused on the reason PT was in room after he asked to be assisted to bed then asked PT why she  is there      Exercises      General Comments General comments (skin integrity, edema, etc.): pt was unable to follow directional cues and needed to be assisted to untangle his IV, as well as having pulled off the condom cath when PT arrived      Pertinent Vitals/Pain Pain Assessment: Faces Faces Pain Scale: No hurt    Home Living                      Prior Function             PT Goals (current goals can now be found in the care plan section) Acute Rehab PT Goals Patient Stated Goal: to return to bed Progress towards PT goals: Progressing toward goals    Frequency    Min 1X/week      PT Plan Current plan remains appropriate    Co-evaluation              AM-PAC PT "6 Clicks" Mobility   Outcome Measure  Help needed turning from your back to your side while in a flat bed without using bedrails?: A Little Help needed moving from lying on your back to sitting on the side of a flat bed without using bedrails?: A Little Help needed moving to and from a bed to a chair (including a wheelchair)?: A Little Help needed standing up from a chair using your arms (e.g., wheelchair or bedside chair)?: A Lot Help needed to walk in hospital room?: A Lot Help needed climbing 3-5 steps with a railing? : Total 6 Click Score: 14    End of Session Equipment Utilized During Treatment: Oxygen Activity Tolerance: Other (comment);Patient limited by fatigue;Patient limited by lethargy Patient left: in bed;with call bell/phone within reach;with bed alarm set Nurse Communication: Mobility status PT Visit Diagnosis: Unsteadiness on feet (R26.81);Other abnormalities of gait and mobility (R26.89);Muscle weakness (generalized) (M62.81);Ataxic gait (R26.0);Difficulty in walking, not elsewhere classified (R26.2)     Time: [id number] PT Time Calculation (min) (ACUTE ONLY): 9 min  Charges:  $Therapeutic Activity: 8-22 mins                    Ramond Dial 08/28/2019, 11:45 AM   Mee Hives, PT MS Acute Rehab Dept. Number: [place]

## 2019-08-28 NOTE — Plan of Care (Signed)
LTGs remain appropriate at min A level

## 2019-08-28 NOTE — Progress Notes (Signed)
ANTICOAGULATION CONSULT NOTE - Follow Up Consult  Pharmacy Consult for Heparin Indication: h/o LV thrombus  No Known Allergies  Patient Measurements: Height: 5\' 11"  (180.3 cm) Weight: 152 lb 12.5 oz (69.3 kg) IBW/kg (Calculated) : 75.3 Heparin Dosing Weight: 71.8 kg  Vital Signs: Temp: 98.2 F (36.8 C) (01/20 2022) Temp Source: Oral (01/20 2022) BP: 131/68 (01/20 2022) Pulse Rate: 87 (01/20 2022)  Labs: Recent Labs    08/26/19 0431 08/26/19 1452 08/27/19 0000 08/27/19 0000 08/27/19 0223 08/27/19 1339 08/28/19 0242  HGB 7.8*  --  7.6*  --   --   --  8.1*  HCT 24.1*  --  23.4*  --   --   --  25.5*  PLT 203  --  207  --   --   --  238  LABPROT 14.3  --  14.8  --   --   --  14.7  INR 1.1  --  1.2  --   --   --  1.2  HEPARINUNFRC <0.10*   < > 1.04*   < > 1.16* 1.42* 0.65  CREATININE 7.76*  --  8.75*  --   --   --  6.10*   < > = values in this interval not displayed.    Estimated Creatinine Clearance: 11.7 mL/min (A) (by C-G formula based on SCr of 6.1 mg/dL (H)).   Assessment: Pt is a 69 year old male with history of renal transplant in 05/2014 and now with ESRD on HD TTS. Pt has been on Eliquis for left ventricular thombus, which has been switched to warfarin. Patient was placed on heparin bridge so that the [facility] can be removed. However, patient's AVF was not able to be cannulated on 1/14; therefore, plans have changed to keep the [facility] in for now. Pharmacy has been consulted to resume warfarin dosing and dose heparin. Of note, patient has also had a recent stroke.  Heparin level this AM was coming down at 0.65 on 950 units/hr. Rate was further decreased to 800 units/hr. Lab and RNs have tried 3 times today to get patient to allow heparin level to be drawn to check for safety of level but without success. Patient adamantly refuses levels. Dr. Hayes Ludwig is aware. Unfortunately no other option due the renal dysfunction.   INR remains low at 1.2 - gave increased dose tonight. Note  patient 25-50% intake which could be effecting INR results. No significant drug interactions noted.    Goal of Therapy:  Heparin level 0.3-0.5 units/ml due to recent CVA Monitor platelets by anticoagulation protocol: Yes   Plan:  Coumadin 5mg  po x1 -ordered prior and given at 1800 PM.  Daily PT/INR. Due to patient refusing labs, will continue heparin at current rate and will attempt in AM to get levels.   Sloan Leiter, PharmD, BCPS, BCCCP Clinical Pharmacist Please refer to [facility] for [place] numbers 08/28/2019, 9:44 PM

## 2019-08-28 NOTE — Progress Notes (Signed)
Pt. refused lab to be drawn. This RN and Tiffany from the lab is with me when pt. refused.  Explained to pt. the importance of the lab draw. Patient still keep refusing. No other issue at this time.

## 2019-08-28 NOTE — Progress Notes (Addendum)
SLP Cancellation Note  Patient Details Name: Jonathan [name]. MRN: [id number] DOB: 09-26-1950   Cancelled treatment:       Reason Eval/Treat Not Completed: Patient declined, no reason specified Patient stating "I don't want to talk to you" after ST introduced self. Did not specify a reason.  ST asked patient if he would be willing to see her later today, patient said "Yes, at 1:00 o'clock."  Addendum:  ST returned in afternoon. Patient engaged with ST in brief conversation and shared that he was having some trouble chewing his food before stating "I don't want to talk to you" and refusing further tx session.   Patient recently d/cd from dysphagia services on 1/13 after MBSS recommended dysphagia 3/thin liquids.  ST initiated secure chat with MD (Dr. Hayes Ludwig) re: changing diet order to dysphagia 3/thin liquids from regular. Diet texture change d/w RN.   If patient continues to demonstrate s/sx of dysphagia with dysphagia 3/thin liquids diet, please re-consult ST.   Marina Goodell, M.Ed., CCC-SLP Speech Therapy Acute Rehabilitation [phone]: Acute Rehab office [phone] - pager    Marina Goodell 08/28/2019, 9:23 AM

## 2019-08-28 NOTE — Progress Notes (Signed)
Patient refuse x3 for lab works. Explained to patient importance of lab draw but to no avail.  Patient is on heparin drip.

## 2019-08-28 NOTE — Progress Notes (Signed)
Occupational Therapy Treatment Patient Details Name: Jonathan Kindred Sr. MRN: [id number] DOB: [birth year] Today's Date: 08/28/2019    History of present illness 69 y.o. 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 Diagnosed with COVID 6 days prior to coming to ED, where chest x-ray showed multifocal pneumonia. Admitted 07/30/19 for treatment of acute encephalopathy, difficulty swallowing and pneumonia.MRI of the brain showed small acute infarcts in the left pons, left occipital lob and right frontal lobe in addition to chronic infarcts    OT comments  Upon entering the room, pt in bed eating breakfast. Pt is observed to be spitting food back out onto tray table and reports, " It's hard to chew". OT notified RN and SLP for further assessment/observation. Pt provided set up A to wash face and hands. Pt is agreeable to sitting in recliner chair this session. Pt given increased time to complete task with min cuing for technique. Pt appearing to do better without use of RW this session and when given choices regarding therapeutic intervention. Pt performed bed mobility supine >sit with mod A to EOB. Pt standing with therapist in front and without use of AD with mod A for transfer into recliner chair. Chair alarm activated and all needs within reach. Pt declined further intervention at this time. SNF recommendation remains appropriate.   Follow Up Recommendations  SNF;Supervision/Assistance - 24 hour    Equipment Recommendations  None recommended by OT    Recommendations for Other Services      Precautions / Restrictions Precautions Precautions: Fall       Mobility Bed Mobility Overal bed mobility: Needs Assistance Bed Mobility: Supine to Sit      General bed mobility comments: increased time and min cuing for  technique  Transfers Overall transfer level: Needs assistance Equipment used: None Transfers: Sit to/from [facility] Sit to Stand: Mod assist Stand pivot transfers: Mod assist       General transfer comment: mod lifting assistance/balance for functional transfers into recliner chair. Pt declines use of RW    Balance Overall balance assessment: Needs assistance Sitting-balance support: No upper extremity supported;Feet supported Sitting balance-Leahy Scale: Fair     Standing balance support: Bilateral upper extremity supported;During functional activity Standing balance-Leahy Scale: Poor Standing balance comment: mod A from therapist for balance          ADL either performed or assessed with clinical judgement   ADL Overall ADL's : Needs assistance/impaired Eating/Feeding: Set up;Bed level;Sitting   Grooming: Wash/dry hands;Wash/dry face;Oral care;Sitting;Set up            Vision Baseline Vision/History: Wears glasses Wears Glasses: At all times            Cognition Arousal/Alertness: Awake/alert Behavior During Therapy: Flat affect Overall Cognitive Status: Impaired/Different from baseline Area of Impairment: Problem solving;Awareness;Safety/judgement;Following commands;Memory;Attention;Orientation       Orientation Level: Situation;Disoriented to Current Attention Level: Selective Memory: Decreased recall of precautions;Decreased short-term memory Following Commands: Follows one step commands with increased time Safety/Judgement: Decreased awareness of safety;Decreased awareness of deficits Awareness: Intellectual Problem Solving: Slow processing;Decreased initiation;Requires verbal cues General Comments: increased time for processing                   Pertinent Vitals/ Pain       Pain Assessment: Faces Faces Pain Scale: No hurt  Frequency  Min 2X/week        Progress Toward Goals  OT Goals(current goals can  now be found in the care plan section)  Progress towards OT goals: Progressing toward goals  Acute Rehab OT Goals Patient Stated Goal: none stated Time For Goal Achievement: 09/11/19 Potential to Achieve Goals: Good  Plan Discharge plan remains appropriate;Frequency remains appropriate       AM-PAC OT "6 Clicks" Daily Activity     Outcome Measure   Help from another person eating meals?: A Little Help from another person taking care of personal grooming?: A Little Help from another person toileting, which includes using toliet, bedpan, or urinal?: A Lot Help from another person bathing (including washing, rinsing, drying)?: A Lot Help from another person to put on and taking off regular upper body clothing?: A Little Help from another person to put on and taking off regular lower body clothing?: A Lot 6 Click Score: 15    End of Session    OT Visit Diagnosis: Unsteadiness on feet (R26.81);Other symptoms and signs involving cognitive function;Other abnormalities of gait and mobility (R26.89)   Activity Tolerance Patient tolerated treatment well   Patient Left with call bell/phone within reach;in chair;with chair alarm set   Nurse Communication Mobility status        Time: [id number] OT Time Calculation (min): 20 min  Charges: OT General Charges $OT Visit: 1 Visit OT Treatments $Self Care/Home Management : 8-22 mins  Gypsy Decant MS, OTR/L 08/28/2019, 9:18 AM

## 2019-08-28 NOTE — Plan of Care (Signed)
  Problem: Health Behavior/Discharge Planning: Goal: Ability to manage health-related needs will improve Outcome: Progressing   Problem: Nutrition: Goal: Risk of aspiration will decrease Outcome: Progressing

## 2019-08-28 NOTE — Progress Notes (Signed)
Hospitalist progress note  Patient from: home, Expected dispo: SNF , Barrier : Awaiting CLIP and SNF-is Covid + precluding some SNF--need therapeutic INR Jonathan Stoffers Sr. 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, DM TY 2, HTN, chronic hepatitis C (treated), MI at age 57, prior stroke Admitted 12/22 swallowing difficulties pocketing food more lethargic-MRI brain showed small infarct in the left pons left occiput neurology saw the patient felt this was secondary to left ventricular thrombus--he was started on heparin He was diagnosed with Covid 07/24/2019--patient has recurrently tested positive but is asymptomatic and this is probably positivity from his index infection in December   Assessment & Plan: COVID-19 pneumonia Resolved --testing + still however--precluding some outpatient HD center placement Acute strokes left pons left occipital Etiology unclear but though to be secondary to  Covid versus LV thrombus Continue aspirin Lipitor Coumadin need skilled facility placement-pending for last several days. CM actively working on placement.  ESRD/HD-hypophos Prior was on MWF-further management per them Need perm access--TDC to continue to be used--needs OP Vasc or IR follow up Renal replacing lytes as prn LV thrombus Starting on Coumadin--bridging with Heparin til INR 2.0--not ready for d/c yet. Remain on heparin drip but refusing labs this pm. Levels had been therapeutic this am but need monitoring. Will attempt labs again this pm, perhaps with dinner, after patient rests this pm. Discussed with his nurse.  bridging back to coumadin 1/14 Prior cadaveric renal transplant Continue prednisone 5---can probably d/c steroids--defer to to renal  Called wife pat [phone] on 1/18--no response--have tried number several times  Subjective: With no bleeding,  Respiratory status improving. He was irritable, has not allowed labs this afternoon but reported that he wanted to take a nap, will consider having labs later.   Consultants:   renal Procedures:   n  Objective: Vitals:   08/27/19 1807 08/27/19 2043 08/28/19 0423 08/28/19 0840  BP: (!) 135/56 (!) 131/57 129/65 (!) 117/51  Pulse: 93 96 95 93  Resp: 18 16 16 12   Temp: 97.9 F (36.6 C) 98.7 F (37.1 C) 99 F (37.2 C) 97.6 F (36.4 C)  TempSrc: Oral Oral Oral Oral  SpO2: 96% 99% 92% 96%  Weight:      Height:        Intake/Output Summary (Last 24 hours) at 08/28/2019 1739 Last data filed at 08/28/2019 1200 Gross per 24 hour  Intake 648.16 ml  Output 2 ml  Net 646.16 ml   Filed Weights   08/24/19 1026 08/27/19 0750 08/27/19 1056  Weight: 71.2 kg 71.4 kg 69.8 kg    Examination:  General: Alert, NAD, resting in bed Cardiac: RRR, S1 and S2 GI: Soft, NT, ND Extremities: no edema Psych: Irritable.  Neuro: Alert, normal speech.   Scheduled Meds: .  stroke: mapping our early stages of recovery book   Does not apply Once  . allopurinol  100 mg Oral Daily  . aspirin  81 mg Oral Daily  . atorvastatin  40 mg Oral Daily  . Chlorhexidine Gluconate Cloth  6 each Topical Q0600  . darbepoetin (ARANESP) injection - DIALYSIS  150 mcg Intravenous Q Tue-HD  . feeding supplement (ENSURE ENLIVE)  237 mL Oral TID BM  . feeding supplement (PRO-STAT SUGAR FREE 64)  30 mL Oral BID  . metoCLOPramide  5 mg Oral BID WC  . metoprolol succinate  25 mg Oral Daily  .  multivitamin  1 tablet Oral QHS  . pantoprazole  40 mg Oral Daily  . predniSONE  5 mg Oral Q breakfast  . sodium chloride flush  3 mL Intravenous Q12H  . Warfarin - Pharmacist Dosing Inpatient   Does not apply q1800   Continuous Infusions: . heparin 850 Units/hr (08/28/19 0714)     LOS: 29 days   Time spent: [place], MD [facility]  08/28/2019, 5:39 PM

## 2019-08-28 NOTE — Progress Notes (Signed)
Patient refused blood draw from lab.

## 2019-08-28 NOTE — Progress Notes (Signed)
Nutrition Follow up   DOCUMENTATION CODES:   Not applicable(suspect PCM)  INTERVENTION:  Liberalize diet to regular    Continue Ensure Enlive po TID, each supplement provides 350 kcal and 20 grams of protein  Continue 30 ml Prostat BID, each supplement provides 100 kcals and 15 grams protein.   Continue renal MVI daily   NUTRITION DIAGNOSIS:   Increased nutrient needs related to chronic illness(ESRD on HD) as evidenced by estimated needs.  Ongoing  GOAL:   Patient will meet greater than or equal to 90% of their needs   Not meeting   MONITOR:   PO intake, Supplement acceptance, Weight trends, Labs, I & O's, Skin  REASON FOR ASSESSMENT:   LOS    ASSESSMENT:   Patient with PMH significant for R kidney transplant, ESRD on HD, CVA, HTN, and DM. Presents this admission with COVID 19 PNA.   Attempted to speak with pt at bedside. Pt with head covered with blanket states "Ma'am I don't want to speak with you." When asked if appetite was okay but states "yes." When asked if he likes Ensure pt states "yes." Does not wish to have anything else. Meal completions charted as 25-50% for his last eight meals (41% average). If intake continues to decline may consider Cortrak placement.   Refused NFPE. Suspect malnutrition given pt is under EDW.   EDW: 80.5 kg  Current weight: 69.8 kg  I/O: +560 ml since Last HD yesterday: 1621 ml net UF  Medications: aranesp, 5 mg reglan BID, rena-vit, prednisone  Labs: Na 134 (L) K 5.2 (H) CBG 157-251  Diet Order:   Diet Order            Diet heart healthy/carb modified Room service appropriate? Yes; Fluid consistency: Thin  Diet effective now              EDUCATION NEEDS:   Not appropriate for education at this time  Skin:  Skin Assessment: Reviewed RN Assessment  Last BM:  1/19  Height:   Ht Readings from Last 1 Encounters:  07/31/19 5\' 11"  (1.803 m)    Weight:   Wt Readings from Last 1 Encounters:  08/27/19 69.8 kg     Ideal Body Weight:  78.2 kg  BMI:  Body mass index is 21.46 kg/m.  Estimated Nutritional Needs:   Kcal:  2300-2500 kcal  Protein:  115-130 grams  Fluid:  1000 ml + UOP   Mariana Single RD, LDN Clinical Nutrition Pager # [phone]

## 2019-08-29 LAB — RENAL FUNCTION PANEL
Albumin: 2.4 g/dL — ABNORMAL LOW (ref 3.5–5.0)
Anion gap: 13 (ref 5–15)
BUN: 58 mg/dL — ABNORMAL HIGH (ref 8–23)
CO2: 25 mmol/L (ref 22–32)
Calcium: 8.5 mg/dL — ABNORMAL LOW (ref 8.9–10.3)
Chloride: 99 mmol/L (ref 98–111)
Creatinine, Ser: 8.05 mg/dL — ABNORMAL HIGH (ref 0.61–1.24)
GFR calc Af Amer: 7 mL/min — ABNORMAL LOW (ref >60)
GFR calc non Af Amer: 6 mL/min — ABNORMAL LOW (ref >60)
Glucose, Bld: 111 mg/dL — ABNORMAL HIGH (ref 70–99)
Phosphorus: 3.4 mg/dL (ref 2.5–4.6)
Potassium: 4.6 mmol/L (ref 3.5–5.1)
Sodium: 137 mmol/L (ref 135–145)

## 2019-08-29 LAB — GLUCOSE, CAPILLARY
Glucose-Capillary: 87 mg/dL (ref 70–99)
Glucose-Capillary: 90 mg/dL (ref 70–99)
Glucose-Capillary: 208 mg/dL — ABNORMAL HIGH (ref 70–99)
Glucose-Capillary: 234 mg/dL — ABNORMAL HIGH (ref 70–99)

## 2019-08-29 LAB — PROTIME-INR
INR: 1.2 (ref 0.8–1.2)
Prothrombin Time: 15 seconds (ref 11.4–15.2)

## 2019-08-29 LAB — CBC
HCT: 27.4 % — ABNORMAL LOW (ref 39.0–52.0)
Hemoglobin: 8.9 g/dL — ABNORMAL LOW (ref 13.0–17.0)
MCH: 34.9 pg — ABNORMAL HIGH (ref 26.0–34.0)
MCHC: 32.5 g/dL (ref 30.0–36.0)
MCV: 107.5 fL — ABNORMAL HIGH (ref 80.0–100.0)
Platelets: 280 10*3/uL (ref 150–400)
RBC: 2.55 MIL/uL — ABNORMAL LOW (ref 4.22–5.81)
RDW: 17.6 % — ABNORMAL HIGH (ref 11.5–15.5)
WBC: 8.8 10*3/uL (ref 4.0–10.5)
nRBC: 0.9 % — ABNORMAL HIGH (ref 0.0–0.2)

## 2019-08-29 LAB — HEPARIN LEVEL (UNFRACTIONATED)
Heparin Unfractionated: 0.8 IU/mL — ABNORMAL HIGH (ref 0.30–0.70)
Heparin Unfractionated: 0.76 IU/mL — ABNORMAL HIGH (ref 0.30–0.70)
Heparin Unfractionated: 0.35 IU/mL (ref 0.30–0.70)

## 2019-08-29 MED ORDER — SODIUM CHLORIDE 0.9 % IV SOLN: 100.0000 mL | INTRAVENOUS | Status: DC | PRN

## 2019-08-29 MED ORDER — HEPARIN SODIUM (PORCINE) 1000 UNIT/ML IJ SOLN
INTRAMUSCULAR | Status: AC
  Filled 2019-08-29: qty 4

## 2019-08-29 MED ORDER — PENTAFLUOROPROP-TETRAFLUOROETH EX AERO: 1.0000 "application " | INHALATION_SPRAY | CUTANEOUS | Status: DC | PRN

## 2019-08-29 MED ORDER — HEPARIN SODIUM (PORCINE) 1000 UNIT/ML DIALYSIS
1000.0000 [IU] | INTRAMUSCULAR | Status: DC | PRN
  Administered 2019-08-29: 3200 [IU] via INTRAVENOUS_CENTRAL

## 2019-08-29 MED ORDER — ALTEPLASE 2 MG IJ SOLR: 2.0000 mg | Freq: Once | INTRAMUSCULAR | Status: DC | PRN

## 2019-08-29 MED ORDER — WARFARIN SODIUM 5 MG PO TABS
5.0000 mg | ORAL_TABLET | Freq: Once | ORAL | Status: AC
  Administered 2019-08-29: 5 mg via ORAL
  Filled 2019-08-29: qty 1

## 2019-08-29 MED ORDER — LIDOCAINE-PRILOCAINE 2.5-2.5 % EX CREA: 1.0000 "application " | TOPICAL_CREAM | CUTANEOUS | Status: DC | PRN

## 2019-08-29 MED ORDER — LIDOCAINE HCL (PF) 1 % IJ SOLN: 5.0000 mL | INTRAMUSCULAR | Status: DC | PRN

## 2019-08-29 NOTE — Plan of Care (Signed)
  Problem: Nutrition: Goal: Risk of aspiration will decrease Outcome: Progressing   Problem: Nutrition: Goal: Dietary intake will improve Outcome: Progressing

## 2019-08-29 NOTE — Progress Notes (Signed)
[facility] Progress Note   Dialysis Orders: TTSG/O -[facility] when COVID 19 negative 4 hr 15 min 180NRE 400/800 80.5 kg 2.0 K/ 2.0 Ca R AVF/RIJ TDC -Heparin 8000 units IV TIW -Mircera 75 mcg IV q 2 weeks (last dose 07/25/19) -Venofer 50 mg IV weekly -Parsabiv 7.5 mg IV TIW  Assessment/ Plan:   1.Acute COVID-19 pneumonia - s/p remdesivir &Decadron. Per primary. Now out of 21 day isolation window but still required to have 2 negative COVID 19 test before he can return to [facility]. Repeat test on 1/14 +. Pt can dialyze at [facility] until COVID tests negative.  2.ESRD- on HD TTS while COVID +. K 5.2 this AM, will give dose lokelma today.Plan for HD tomorrow.Had been using AVF here w/16G needles but unable to cannulate 1/14 or 1/16.Tolerated 15G needles yesterday, if successful again on Thurs will have TDC removed. Shortened treatment due to high census.   Seen on HD 82/52 2K Goal UF 2L net Turned off UF (there has been a significant drop in his BP pre and since starting HD this AM. He is mentating well.   3. Anemiaof CKD-Hgb improving, now 8.1.Denies bleeding.Increased Aranesp to 124mcg qwk. Transfuse prn.tsat 34%. 4. Secondary hyperparathyroidism- Phos improved to 2.5, CCa 9.2. No binders or VDRA. No parsabiv because not on formulary.  5.HTN/volume- Blood pressure well controlled. Does not appear volume overloaded, titrate down volume as tolerated. Well below EDW, will need to lower at d/c ~71kg.  6. Nutrition- Albumin low. Dys 3 diet. Continue renal vit/prostat.  7.LV thrombus- on coumadin. Per primary 8.Acute CVA- etiology unclear. Per primary 9. Dispo -ok to d/c from renal standpoint. Awaiting SNF placement.  Subjective:   Denies f/cn/v/dyspnea.   Objective:   BP (!) 85/52   Pulse 91   Temp 97.7 F (36.5 C) (Oral)   Resp 18   Ht 5\' 11"  (1.803 m)   Wt 69.4 kg   SpO2 95%   BMI 21.34 kg/m   Intake/Output Summary (Last 24 hours) at  08/29/2019 0954 Last data filed at 08/29/2019 0600 Gross per 24 hour  Intake 641.99 ml  Output --  Net 641.99 ml   Weight change: -2.1 kg  Physical Exam: General:NAD, WDWN male, laying in bed Heart:refused auscultation Lungs:normal WOB, refused auscultation  Extremities:no LE edema Dialysis Access: RU AVF cannulated    Imaging: No results found.  Labs: BMET Recent Labs  Lab 08/23/19 0550 08/24/19 0340 08/25/19 0403 08/26/19 0431 08/27/19 0000 08/28/19 0242 08/29/19 0700  NA  --  135 134* 133* 133* 134* 137  K  --  4.1 4.5 4.6 5.1 5.2* 4.6  CL  --  95* 93* 96* 96* 100 99  CO2  --  26 28 28 25 25 25   GLUCOSE  --  253* 255* 189* 191* 195* 111*  BUN  --  56* 42* 63* 82* 50* 58*  CREATININE  --  6.97* 5.59* 7.76* 8.75* 6.10* 8.05*  CALCIUM  --  8.2* 8.3* 8.3* 8.2* 7.8* 8.5*  PHOS 2.7 2.1* 2.6 3.3 3.9 2.5 3.4   CBC Recent Labs  Lab 08/26/19 0431 08/27/19 0000 08/28/19 0242 08/29/19 0700  WBC 8.8 9.1 8.9 8.8  HGB 7.8* 7.6* 8.1* 8.9*  HCT 24.1* 23.4* 25.5* 27.4*  MCV 103.9* 103.5* 107.1* 107.5*  PLT 203 207 238 280    Medications:    .  stroke: mapping our early stages of recovery book   Does not apply Once  . allopurinol  100 mg  Oral Daily  . aspirin  81 mg Oral Daily  . atorvastatin  40 mg Oral Daily  . Chlorhexidine Gluconate Cloth  6 each Topical Q0600  . darbepoetin (ARANESP) injection - DIALYSIS  150 mcg Intravenous Q Tue-HD  . feeding supplement (ENSURE ENLIVE)  237 mL Oral TID BM  . feeding supplement (PRO-STAT SUGAR FREE 64)  30 mL Oral BID  . metoCLOPramide  5 mg Oral BID WC  . metoprolol succinate  25 mg Oral Daily  . multivitamin  1 tablet Oral QHS  . pantoprazole  40 mg Oral Daily  . predniSONE  5 mg Oral Q breakfast  . sodium chloride flush  3 mL Intravenous Q12H  . Warfarin - Pharmacist Dosing Inpatient   Does not apply q1800      Otelia Santee, MD 08/29/2019, 9:54 AM ] \

## 2019-08-29 NOTE — Progress Notes (Signed)
ANTICOAGULATION CONSULT NOTE - Follow Up Consult  Pharmacy Consult for Heparin Indication: h/o LV thrombus  No Known Allergies  Patient Measurements: Height: 5\' 11"  (180.3 cm) Weight: 149 lb 7.6 oz (67.8 kg) IBW/kg (Calculated) : 75.3 Heparin Dosing Weight: 71.8 kg  Vital Signs: Temp: 97.7 F (36.5 C) (01/21 1023) Temp Source: Oral (01/21 1023) BP: 136/63 (01/21 1023) Pulse Rate: 88 (01/21 1023)  Labs: Recent Labs    08/27/19 0000 08/27/19 0000 08/27/19 0223 08/27/19 1339 08/28/19 0242 08/29/19 0559 08/29/19 0700  HGB 7.6*   < >  --   --  8.1*  --  8.9*  HCT 23.4*  --   --   --  25.5*  --  27.4*  PLT 207  --   --   --  238  --  280  LABPROT 14.8  --   --   --  14.7  --  15.0  INR 1.2  --   --   --  1.2  --  1.2  HEPARINUNFRC 1.04*  --    < > 1.42* 0.65 0.80*  --   CREATININE 8.75*  --   --   --  6.10*  --  8.05*   < > = values in this interval not displayed.    Estimated Creatinine Clearance: 8.7 mL/min (A) (by C-G formula based on SCr of 8.05 mg/dL (H)).   Assessment: Pt is a 69 year old male with history of renal transplant in 05/2014 and now with ESRD on HD TTS. Pt has been on Eliquis for left ventricular thombus, which has been switched to warfarin. Patient was placed on heparin bridge so that the [facility] can be removed. However, patient's AVF was not able to be cannulated on 1/14; therefore, plans have changed to keep the [facility] in for now. Pharmacy has been consulted to resume warfarin dosing and dose heparin. Of note, patient has also had a recent stroke.  Heparin level this AM was supratherapeutic at 0.80 on 950 units/hr. Rate had been decreased to 850 units/hr yesterday night.  INR remains low at 1.2 - Note patient 25-50% intake which could be effecting INR results. No significant drug interactions noted.    Goal of Therapy:  Heparin level 0.3-0.5 units/ml due to recent CVA Monitor platelets by anticoagulation protocol: Yes   Plan:  Coumadin 5mg  po x1 -ordered  prior and given at 1800 PM.  Daily PT/INR. Due to patient refusing labs, unsure how accurate heparin levels are if drawn during HD. Will try to repeat Heparin level at 12:30 before making adjustments  Sherren Kerns, PharmD PGY1 [place] Resident 08/29/2019, 10:33 AM

## 2019-08-29 NOTE — TOC Progression Note (Signed)
Transition of Care (TOC) - Progression Note    Patient Details  Name: [name] MRN: [id number] Date of Birth: [birth year]  Transition of Care [facility]) CM/SW Contact  Sharlet Salina Mila Homer, LCSW Phone Number: 08/29/2019, 3:30 PM  Clinical Narrative:   CSW continuing facility search. On 1/20, CSW contacted [facility], as they made a bed offer, regarding patient and message left.  *1/20 - Contacted:Chris Jodell Cipro, admissions liaison with [facility] and was informed that they are not currently accepting dialysis patients. 1/20 - *[facility] and message left for [employer] [employer]. Received call later (4:50 pm) - [phone] from Elmyra Ricks and was informed that she is not in the office and would call CSW tomorrow. Will follow-up with Elmyra Ricks. *1/21: [place] and spoke with Magda Paganini (work cell: [phone]), admissions director regarding bed offer made in [place]. CSW advised that they are currently not able to accept HD patients due to issues (COVID related) with transportation. She requested to be contacted next Tuesday, as there may be changes with transportation.       Expected Discharge Plan: [place]    Expected Discharge Plan and Services Expected Discharge Plan: [place] arrangements for the past 2 months: Single Family Home                                      Social Determinants of Health (SDOH) Interventions    Readmission Risk Interventions No flowsheet data found.

## 2019-08-29 NOTE — Progress Notes (Signed)
Notified by lab personnel that pt. refused lab draw. I went to pt.'s room,asked and explained to pt. about the importance of labs to be drawn. He still adamantly refusing it.

## 2019-08-29 NOTE — Progress Notes (Signed)
[facility]  PROGRESS NOTE    Jonathan Perniciaro Sr.  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 Eating lunch. Says he's feeling ok. Awaiting outpt HD spot. Difficult d/t COVID testing.    Assessment & Plan:   Principal Problem:   Acute respiratory failure due to COVID-19 [facility]) Active Problems:   Chronic hepatitis C without hepatic coma ([name])   Diabetes mellitus type 2, uncomplicated ([name])   History of coronary artery disease   Hypertension   ESRD (end stage renal disease) on dialysis ([name])   Acute respiratory failure with hypoxia ([name])   Cerebral thrombosis with cerebral infarction   Cerebral embolism with cerebral infarction  COVID-19 pneumonia     - Resolved     - testing is still positive and this is precluding outpatient HD center placement; working on options     - needs SNF  Acute strokes left pons left occipital     - Etiology unclear but though to be secondary to  Covid versus LV thrombus     - continue ASA, lipitor, coumadin (INR is still not therapeutic)     - need skilled facility placement  ESRD/HD Hypophosphatemia     - Prior was on MWF; further management per them     - HD tomorrow     - needs TDC removed     - per neprho note, may be able to dialysis at [facility]  LV thrombus     - coumadin; bridging with Heparin til  INR 2.0  Prior cadaveric renal transplant     - continue prednisone 5  DVT prophylaxis: coumadin, heparin Code Status: FULL Family Communication: None at bedside   Disposition Plan: Needs SNF  Consultants:   Nephrology   ROS:  Denies CP, N, V, ab pain . Remainder 10-pt ROS is negative for all not previously mentioned.  Subjective: "I'm fine. i'm hungry."  Objective: Vitals:   08/29/19 0930 08/29/19 1000 08/29/19 1023 08/29/19 1057  BP: (!) 85/52 124/63 136/63 119/71  Pulse: 91 90 88 91  Resp:   17 18  Temp:   97.7 F (36.5 C) 98.1 F (36.7 C)  TempSrc:   Oral Oral  SpO2:   97%   Weight:   67.8 kg   Height:        Intake/Output Summary (Last 24 hours) at 08/29/2019 1651 Last data filed at 08/29/2019 1400 Gross per 24 hour  Intake 766.66 ml  Output 1550 ml  Net -783.34 ml   Filed Weights   08/28/19 2022 08/29/19 0700 08/29/19 1023  Weight: 69.3 kg 69.4 kg 67.8 kg    Examination:  General: 69 y.o. male resting in bed in NAD Cardiovascular: RRR, +S1, S2, no m/g/r Respiratory: CTABL, no w/r/r, normal WOB GI: BS+, NDNT, no masses noted MSK: No e/c/c Neuro: Alert to name, follows commands Psyc:calm/cooperative   Data Reviewed: I have personally reviewed following labs and imaging studies.  CBC: Recent Labs  Lab 08/25/19 0403 08/26/19 0431 08/27/19 0000 08/28/19 0242 08/29/19 0700  WBC 9.4 8.8 9.1 8.9 8.8  HGB 8.3* 7.8* 7.6* 8.1* 8.9*  HCT 26.1* 24.1* 23.4* 25.5* 27.4*  MCV 106.5* 103.9* 103.5* 107.1* 107.5*  PLT 199 203 207 238 [id number]   Basic Metabolic Panel: Recent Labs  Lab 08/25/19 0403 08/26/19 0431 08/27/19 0000 08/28/19 0242 08/29/19 0700  NA 134* 133* 133* 134* 137  K 4.5 4.6 5.1 5.2* 4.6  CL 93* 96* 96* 100 99  CO2 28 28 25 25 25   GLUCOSE 255* 189* 191* 195* 111*  BUN 42* 63* 82* 50* 58*  CREATININE 5.59* 7.76* 8.75* 6.10* 8.05*  CALCIUM 8.3* 8.3* 8.2* 7.8* 8.5*  PHOS 2.6 3.3 3.9 2.5 3.4   GFR: Estimated Creatinine Clearance:  8.7 mL/min (A) (by C-G formula based on SCr of 8.05 mg/dL (H)). Liver Function Tests: Recent Labs  Lab 08/25/19 0403 08/26/19 0431 08/27/19 0000 08/28/19 0242 08/29/19 0700  ALBUMIN 2.5* 2.3* 2.3* 2.3* 2.4*   No results for input(s): LIPASE, AMYLASE in the last 168 hours. No results for input(s): AMMONIA in the last 168 hours. Coagulation Profile: Recent Labs  Lab 08/25/19 0403 08/26/19 0431 08/27/19 0000 08/28/19 0242 08/29/19 0700  INR 1.2 1.1 1.2 1.2 1.2   Cardiac Enzymes: No results for input(s): CKTOTAL, CKMB, CKMBINDEX, TROPONINI in the last 168 hours. BNP (last 3 results) No results for input(s): PROBNP in the last 8760 hours. HbA1C: No results for input(s): HGBA1C in the last 72 hours. CBG: Recent Labs  Lab 08/28/19 1721 08/28/19 2019 08/29/19 0805 08/29/19 1054 08/29/19 1620  GLUCAP 207* 172* 87 90 208*   Lipid Profile: No results for input(s): CHOL, HDL, LDLCALC, TRIG, CHOLHDL, LDLDIRECT in the last 72 hours. Thyroid Function Tests: No results for input(s): TSH, T4TOTAL, FREET4, T3FREE, THYROIDAB in the last 72 hours. Anemia Panel: Recent Labs    08/27/19 0000  TIBC 165*  IRON 56   Sepsis Labs: No results for input(s): PROCALCITON, LATICACIDVEN in the last 168 hours.  Recent Results (from the past 240 hour(s))  SARS CORONAVIRUS 2 (TAT 6-24 HRS) Nasopharyngeal Nasopharyngeal Swab     Status: Abnormal   Collection Time: 08/22/19  7:31 AM   Specimen: Nasopharyngeal Swab  Result Value Ref Range Status   SARS Coronavirus 2 POSITIVE (A) NEGATIVE Final    Comment: RESULT CALLED TO, READ BACK BY AND VERIFIED WITH: A. [place] 15:50 08/22/19 ([email or web address]) (NOTE) SARS-CoV-2 target nucleic acids are DETECTED. The SARS-CoV-2 RNA is generally detectable in upper and lower respiratory specimens during the acute phase of infection. Positive results are indicative of the presence of SARS-CoV-2 RNA. Clinical correlation with patient history and other diagnostic  information is  necessary to determine patient infection status. Positive results do not rule out bacterial infection or co-infection with other viruses.  The expected result is Negative. Fact Sheet for Patients: SugarRoll.be Fact Sheet for Healthcare Providers: https://www.woods-mathews.com/ This test is not yet approved or cleared by the [place] FDA and  has been authorized for detection and/or diagnosis of SARS-CoV-2 by FDA under an Emergency Use Authorization (EUA). This EUA will remain  in effect (meaning this test can be used) for the d uration of the COVID-19 declaration under Section 564(b)(1) of the Act, 21 U.S.C. section 360bbb-3(b)(1), unless the authorization is terminated or revoked sooner. Performed at [facility], [place] [address] [place], [place] [address]       Radiology Studies: No results  found.   Scheduled Meds: .  stroke: mapping our early stages of recovery book   Does not apply Once  . allopurinol  100 mg Oral Daily  . aspirin  81 mg Oral Daily  . atorvastatin  40 mg Oral Daily  . Chlorhexidine Gluconate Cloth  6 each Topical Q0600  . darbepoetin (ARANESP) injection - DIALYSIS  150 mcg Intravenous Q Tue-HD  . feeding supplement (ENSURE ENLIVE)  237 mL Oral TID BM  . feeding supplement (PRO-STAT SUGAR FREE 64)  30 mL Oral BID  . heparin      . metoCLOPramide  5 mg Oral BID WC  . metoprolol succinate  25 mg Oral Daily  . multivitamin  1 tablet Oral QHS  . pantoprazole  40 mg Oral Daily  . predniSONE  5 mg Oral Q breakfast  . sodium chloride flush  3 mL Intravenous Q12H  . warfarin  5 mg Oral ONCE-1800  . Warfarin - Pharmacist Dosing Inpatient   Does not apply q1800   Continuous Infusions: . heparin 700 Units/hr (08/29/19 1347)     LOS: 30 days    Time spent: 25 minutes spent in the coordination of care today.    Jonnie Finner, DO [facility]  If 7PM-7AM, please contact  night-coverage [email or web address] 08/29/2019, 4:51 PM

## 2019-08-29 NOTE — Progress Notes (Signed)
ANTICOAGULATION CONSULT NOTE - Follow Up Consult  Pharmacy Consult for Heparin Indication: h/o LV thrombus  No Known Allergies  Patient Measurements: Height: 5\' 11"  (180.3 cm) Weight: 149 lb 7.6 oz (67.8 kg) IBW/kg (Calculated) : 75.3 Heparin Dosing Weight: 71.8 kg  Vital Signs: Temp: 98.1 F (36.7 C) (01/21 1057) Temp Source: Oral (01/21 1057) BP: 119/71 (01/21 1057) Pulse Rate: 91 (01/21 1057)  Labs: Recent Labs    08/27/19 0000 08/27/19 0223 08/28/19 0242 08/29/19 0559 08/29/19 0700 08/29/19 1252  HGB 7.6*   < > 8.1*  --  8.9*  --   HCT 23.4*  --  25.5*  --  27.4*  --   PLT 207  --  238  --  280  --   LABPROT 14.8  --  14.7  --  15.0  --   INR 1.2  --  1.2  --  1.2  --   HEPARINUNFRC 1.04*   < > 0.65 0.80*  --  0.76*  CREATININE 8.75*  --  6.10*  --  8.05*  --    < > = values in this interval not displayed.    Estimated Creatinine Clearance: 8.7 mL/min (A) (by C-G formula based on SCr of 8.05 mg/dL (H)).   Assessment: Pt is a 69 year old male with history of renal transplant in 05/2014 and now with ESRD on HD TTS. Pt has been on Eliquis for left ventricular thombus, which has been switched to warfarin. Patient was placed on heparin bridge so that the [facility] can be removed. However, patient's AVF was not able to be cannulated on 1/14; therefore, plans have changed to keep the [facility] in for now. Pharmacy has been consulted to resume warfarin dosing and dose heparin. Of note, patient has also had a recent stroke.  Heparin level this AM was supratherapeutic at 0.80 on 850 units/hr. Repeat check after HD is still elevated at 0.76 Rate had been decreased to 850 units/hr yesterday night. No signs/symptoms of bleeding or problems with infusion per RN.  INR remains low at 1.2 - Note patient 25-50% intake which could be effecting INR results. No significant drug interactions noted.    Goal of Therapy:  Heparin level 0.3-0.5 units/ml due to recent CVA Monitor platelets by  anticoagulation protocol: Yes   Plan:  Coumadin 5mg  po x1  Reduce heparin gtt to 700 units/hr Recheck heparin level in 8 hours Daily PT/INR. Continue daily heparin level and CBC Monitor for signs/symptoms of bleeding.  Sherren Kerns, PharmD PGY1 [facility] Pharm[facility] Resident 08/29/2019, 1:46 PM

## 2019-08-30 LAB — CBC
HCT: 27.8 % — ABNORMAL LOW (ref 39.0–52.0)
Hemoglobin: 8.8 g/dL — ABNORMAL LOW (ref 13.0–17.0)
MCH: 33.7 pg (ref 26.0–34.0)
MCHC: 31.7 g/dL (ref 30.0–36.0)
MCV: 106.5 fL — ABNORMAL HIGH (ref 80.0–100.0)
Platelets: 235 10*3/uL (ref 150–400)
RBC: 2.61 MIL/uL — ABNORMAL LOW (ref 4.22–5.81)
RDW: 18.4 % — ABNORMAL HIGH (ref 11.5–15.5)
WBC: 9 10*3/uL (ref 4.0–10.5)
nRBC: 0.9 % — ABNORMAL HIGH (ref 0.0–0.2)

## 2019-08-30 LAB — RENAL FUNCTION PANEL
Albumin: 2.4 g/dL — ABNORMAL LOW (ref 3.5–5.0)
Anion gap: 11 (ref 5–15)
BUN: 34 mg/dL — ABNORMAL HIGH (ref 8–23)
CO2: 25 mmol/L (ref 22–32)
Calcium: 8.1 mg/dL — ABNORMAL LOW (ref 8.9–10.3)
Chloride: 97 mmol/L — ABNORMAL LOW (ref 98–111)
Creatinine, Ser: 5.83 mg/dL — ABNORMAL HIGH (ref 0.61–1.24)
GFR calc Af Amer: 11 mL/min — ABNORMAL LOW (ref >60)
GFR calc non Af Amer: 9 mL/min — ABNORMAL LOW (ref >60)
Glucose, Bld: 171 mg/dL — ABNORMAL HIGH (ref 70–99)
Phosphorus: 2.9 mg/dL (ref 2.5–4.6)
Potassium: 4.3 mmol/L (ref 3.5–5.1)
Sodium: 133 mmol/L — ABNORMAL LOW (ref 135–145)

## 2019-08-30 LAB — GLUCOSE, CAPILLARY
Glucose-Capillary: 163 mg/dL — ABNORMAL HIGH (ref 70–99)
Glucose-Capillary: 219 mg/dL — ABNORMAL HIGH (ref 70–99)
Glucose-Capillary: 322 mg/dL — ABNORMAL HIGH (ref 70–99)

## 2019-08-30 LAB — MAGNESIUM: Magnesium: 1.9 mg/dL (ref 1.7–2.4)

## 2019-08-30 LAB — PROTIME-INR
INR: 1.3 — ABNORMAL HIGH (ref 0.8–1.2)
Prothrombin Time: 16.5 seconds — ABNORMAL HIGH (ref 11.4–15.2)

## 2019-08-30 LAB — HEPARIN LEVEL (UNFRACTIONATED)
Heparin Unfractionated: 0.24 IU/mL — ABNORMAL LOW (ref 0.30–0.70)
Heparin Unfractionated: 0.27 IU/mL — ABNORMAL LOW (ref 0.30–0.70)

## 2019-08-30 MED ORDER — WARFARIN SODIUM 6 MG PO TABS
6.0000 mg | ORAL_TABLET | Freq: Once | ORAL | Status: AC
  Administered 2019-08-30: 6 mg via ORAL
  Filled 2019-08-30: qty 1

## 2019-08-30 NOTE — Progress Notes (Addendum)
Patient CBG result as of 20:28  is 322 mg/dL. Textpaged on call provider, NP [place]. Awaiting response.

## 2019-08-30 NOTE — Progress Notes (Signed)
ANTICOAGULATION CONSULT NOTE - Follow Up Consult  Pharmacy Consult for Heparin Indication: h/o LV thrombus    Assessment: Pt is a 69 year old male with history of renal transplant in 05/2014 and now with ESRD on HD TTS. Pt has been on Eliquis for left ventricular thombus, which has been switched to warfarin. Patient was placed on heparin bridge so that the [facility] can be removed. However, patient's AVF was not able to be cannulated on 1/14; therefore, plans have changed to keep the [facility] in for now. Pharmacy has been consulted to resume warfarin dosing and dose heparin. Of note, patient has also had a recent stroke.  Heparin level this evening in goal range 0.35 units/ml  Goal of Therapy:  Heparin level 0.3-0.5 units/ml due to recent CVA Monitor platelets by anticoagulation protocol: Yes   Plan:  Continue heparin gtt at 700 units/hr Continue daily heparin level and CBC Monitor for signs/symptoms of bleeding.  Thanks for allowing pharmacy to be a part of this patient's care.  Excell Seltzer, PharmD Clinical Pharmacist  08/30/2019, 12:04 AM

## 2019-08-30 NOTE — Progress Notes (Signed)
[facility]  PROGRESS NOTE    Jonathan Vanvliet Sr.  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 Per CM working on SNF option d/t COVID history. Otherwise medically stable for d/c.   Assessment & Plan:   Principal Problem:   Acute respiratory failure due to COVID-19 [facility]) Active Problems:   Chronic hepatitis C without hepatic coma ([name])   Diabetes mellitus type 2, uncomplicated ([name])   History of coronary artery disease   Hypertension   ESRD (end stage renal disease) on dialysis ([name])   Acute respiratory failure with hypoxia ([name])   Cerebral thrombosis with cerebral infarction   Cerebral embolism with cerebral infarction  COVID-19 pneumonia     - Resolved     - testing is still positive and this is precluding outpatient HD center placement; working on options     - needs SNF; this is the hanging point d/t COVID history in combination with HD needs  Acute strokes left pons left occipital     - Etiology unclear but though to be secondary to  Covid versus LV thrombus     - continue ASA, lipitor, coumadin (INR is still not therapeutic)     - need skilled facility placement  ESRD/HD Hypophosphatemia     - Prior was on MWF; further management per them     - HD tomorrow     - needs TDC removed     - per neprho note, may be able to dialysis at [facility]  LV thrombus     - coumadin; bridging with Heparin til INR 2.0     - INR 1.3 this AM  Prior cadaveric renal transplant     - continue prednisone 5  DVT prophylaxis: coumadin, heparin Code Status: FULL Family Communication: None at bedside   Disposition Plan: To SNF when available after INR is therapeutic.   Consultants:   Nephrology  ROS:  Denies CP, N, V, ab pain. . Remainder 10-pt ROS is negative for all not previously mentioned.  Subjective: "No. I'm fine."  Objective: Vitals:   08/29/19 1057 08/29/19 1700 08/29/19 2109 08/30/19 0530  BP: 119/71 109/71 120/86 128/87  Pulse: 91 94 96 90  Resp: 18 18 18 18   Temp: 98.1 F (36.7 C) 98 F (36.7 C) 98 F (36.7 C) 98.2 F (36.8 C)  TempSrc: Oral Oral  Oral  SpO2:  97% 100% 100%  Weight:   70.4 kg   Height:        Intake/Output Summary (Last 24 hours) at 08/30/2019 0916 Last data filed at 08/30/2019 0600 Gross per 24 hour  Intake 751.67 ml  Output 1650 ml  Net -898.33 ml   Filed Weights   08/29/19 0700 08/29/19 1023 08/29/19 2109  Weight: 69.4 kg 67.8 kg 70.4 kg    Examination:  General: 69 y.o. male resting in bed in NAD Cardiovascular: RRR, +S1, S2, no m/g/r Respiratory: CTABL, no  w/r/r, normal WOB GI: BS+, NDNT, no masses noted, soft MSK: No e/c/c Neuro: Alert to name, follows commands Psyc: Appropriate interaction and affect, calm/cooperative  Data Reviewed: I have personally reviewed following labs and imaging studies.  CBC: Recent Labs  Lab 08/26/19 0431 08/27/19 0000 08/28/19 0242 08/29/19 0700 08/30/19 0551  WBC 8.8 9.1 8.9 8.8 9.0  HGB 7.8* 7.6* 8.1* 8.9* 8.8*  HCT 24.1* 23.4* 25.5* 27.4* 27.8*  MCV 103.9* 103.5* 107.1* 107.5* 106.5*  PLT 203 207 238 280 [id number]   Basic Metabolic Panel: Recent Labs  Lab 08/26/19 0431 08/27/19 0000 08/28/19 0242 08/29/19 0700 08/30/19 0551  NA 133* 133* 134* 137 133*  K 4.6 5.1 5.2* 4.6 4.3  CL 96* 96* 100 99 97*  CO2 28 25 25 25 25    GLUCOSE 189* 191* 195* 111* 171*  BUN 63* 82* 50* 58* 34*  CREATININE 7.76* 8.75* 6.10* 8.05* 5.83*  CALCIUM 8.3* 8.2* 7.8* 8.5* 8.1*  MG  --   --   --   --  1.9  PHOS 3.3 3.9 2.5 3.4 2.9   GFR: Estimated Creatinine Clearance: 12.4 mL/min (A) (by C-G formula based on SCr of 5.83 mg/dL (H)). Liver Function Tests: Recent Labs  Lab 08/26/19 0431 08/27/19 0000 08/28/19 0242 08/29/19 0700 08/30/19 0551  ALBUMIN 2.3* 2.3* 2.3* 2.4* 2.4*   No results for input(s): LIPASE, AMYLASE in the last 168 hours. No results for input(s): AMMONIA in the last 168 hours. Coagulation Profile: Recent Labs  Lab 08/26/19 0431 08/27/19 0000 08/28/19 0242 08/29/19 0700 08/30/19 0551  INR 1.1 1.2 1.2 1.2 1.3*   Cardiac Enzymes: No results for input(s): CKTOTAL, CKMB, CKMBINDEX, TROPONINI in the last 168 hours. BNP (last 3 results) No results for input(s): PROBNP in the last 8760 hours. HbA1C: No results for input(s): HGBA1C in the last 72 hours. CBG: Recent Labs  Lab 08/29/19 0805 08/29/19 1054 08/29/19 1620 08/29/19 2109 08/30/19 0722  GLUCAP 87 90 208* 234* 163*   Lipid Profile: No results for input(s): CHOL, HDL, LDLCALC, TRIG, CHOLHDL, LDLDIRECT in the last 72 hours. Thyroid Function Tests: No results for input(s): TSH, T4TOTAL, FREET4, T3FREE, THYROIDAB in the last 72 hours. Anemia Panel: No results for input(s): VITAMINB12, FOLATE, FERRITIN, TIBC, IRON, RETICCTPCT in the last 72 hours. Sepsis Labs: No results for input(s): PROCALCITON, LATICACIDVEN in the last 168 hours.  Recent Results (from the past 240 hour(s))  SARS CORONAVIRUS 2 (TAT 6-24 HRS) Nasopharyngeal Nasopharyngeal Swab     Status: Abnormal   Collection Time: 08/22/19  7:31 AM   Specimen: Nasopharyngeal Swab  Result Value Ref Range Status   SARS Coronavirus 2 POSITIVE (A) NEGATIVE Final    Comment: RESULT CALLED TO, READ BACK BY AND VERIFIED WITH: A. Gurabo 15:50 08/22/19 ([email or web address]) (NOTE) SARS-CoV-2 target  nucleic acids are DETECTED. The SARS-CoV-2 RNA is generally detectable in upper and lower respiratory specimens during the acute phase of infection. Positive results are indicative of the presence of SARS-CoV-2 RNA. Clinical correlation with patient history and other diagnostic information is  necessary to determine patient infection status. Positive results do not rule out bacterial infection or co-infection with other viruses.  The expected result is Negative. Fact Sheet for Patients: SugarRoll.be Fact Sheet for Healthcare Providers: https://www.woods-mathews.com/ This test is not yet approved or cleared by the [place] FDA and  has been authorized for detection and/or diagnosis of SARS-CoV-2 by FDA under an Emergency Use Authorization (EUA). This EUA will remain  in effect (meaning this  test can be used) for the d uration of the COVID-19 declaration under Section 564(b)(1) of the Act, 21 U.S.C. section 360bbb-3(b)(1), unless the authorization is terminated or revoked sooner. Performed at [facility], [place] [address] [place], [place] [address]       Radiology Studies: No results found.   Scheduled Meds: .  stroke: mapping our early stages of recovery book   Does not apply Once  . allopurinol  100 mg Oral Daily  . aspirin  81 mg Oral Daily  . atorvastatin  40 mg Oral Daily  . Chlorhexidine Gluconate Cloth  6 each Topical Q0600  . darbepoetin (ARANESP) injection - DIALYSIS  150 mcg Intravenous Q Tue-HD  . feeding supplement (ENSURE ENLIVE)  237 mL Oral TID BM  . feeding supplement (PRO-STAT SUGAR FREE 64)  30 mL Oral BID  . metoCLOPramide  5 mg Oral BID WC  . metoprolol succinate  25 mg Oral Daily  . multivitamin  1 tablet Oral QHS  . pantoprazole  40 mg Oral Daily  . predniSONE  5 mg Oral Q breakfast  . sodium chloride flush  3 mL Intravenous Q12H  . Warfarin - Pharmacist Dosing Inpatient   Does not apply q1800    Continuous Infusions: . heparin 700 Units/hr (08/29/19 1347)     LOS: 31 days    Time spent: 15 minutes spent in the coordination of care today.    Jonnie Finner, DO [facility]  If 7PM-7AM, please contact night-coverage [email or web address] 08/30/2019, 9:16 AM

## 2019-08-30 NOTE — Progress Notes (Signed)
[facility] Progress Note   Subjective: Grumpy, refusing exam today. Does say he feels OK.   Objective Vitals:   08/29/19 1700 08/29/19 2109 08/30/19 0530 08/30/19 0924  BP: 109/71 120/86 128/87 116/66  Pulse: 94 96 90 82  Resp: 18 18 18 20   Temp: 98 F (36.7 C) 98 F (36.7 C) 98.2 F (36.8 C) 98 F (36.7 C)  TempSrc: Oral  Oral Oral  SpO2: 97% 100% 100% 97%  Weight:  70.4 kg    Height:       Physical Exam General: Chronically ill irritable appearing male in NAD Heart: Refused exam Lungs: Refused exam Abdomen: Refused exam Extremities:Refused exam. No visible edema Dialysis Access: RIJ TDC visible, Drsg CDI. R AVF   Additional Objective Labs: Basic Metabolic Panel: Recent Labs  Lab 08/28/19 0242 08/29/19 0700 08/30/19 0551  NA 134* 137 133*  K 5.2* 4.6 4.3  CL 100 99 97*  CO2 25 25 25   GLUCOSE 195* 111* 171*  BUN 50* 58* 34*  CREATININE 6.10* 8.05* 5.83*  CALCIUM 7.8* 8.5* 8.1*  PHOS 2.5 3.4 2.9   Liver Function Tests: Recent Labs  Lab 08/28/19 0242 08/29/19 0700 08/30/19 0551  ALBUMIN 2.3* 2.4* 2.4*   No results for input(s): LIPASE, AMYLASE in the last 168 hours. CBC: Recent Labs  Lab 08/26/19 0431 08/26/19 0431 08/27/19 0000 08/27/19 0000 08/28/19 0242 08/29/19 0700 08/30/19 0551  WBC 8.8   < > 9.1   < > 8.9 8.8 9.0  HGB 7.8*   < > 7.6*   < > 8.1* 8.9* 8.8*  HCT 24.1*   < > 23.4*   < > 25.5* 27.4* 27.8*  MCV 103.9*  --  103.5*  --  107.1* 107.5* 106.5*  PLT 203   < > 207   < > 238 280 235   < > = values in this interval not displayed.   Blood Culture    Component Value Date/Time   SDES BLOOD SITE NOT SPECIFIED 07/30/2019 1843   SPECREQUEST  07/30/2019 1843    BOTTLES DRAWN AEROBIC AND ANAEROBIC Blood Culture results may not be optimal due to an inadequate volume of blood received in culture bottles   CULT  07/30/2019 1843    NO GROWTH 5 DAYS Performed at [facility], [place] [address] [place], [place]  [address]    REPTSTATUS 08/04/2019 FINAL 07/30/2019 1843    Cardiac Enzymes: No results for input(s): CKTOTAL, CKMB, CKMBINDEX, TROPONINI in the last 168 hours. CBG: Recent Labs  Lab 08/29/19 0805 08/29/19 1054 08/29/19 1620 08/29/19 2109 08/30/19 0722  GLUCAP 87 90 208* 234* 163*   Iron Studies: No results for input(s): IRON, TIBC, TRANSFERRIN, FERRITIN in the last 72 hours. @lablastinr3 @ Studies/Results: No results found. Medications: . heparin 800 Units/hr (08/30/19 0930)   .  stroke: mapping our early stages of recovery book   Does not apply Once  . allopurinol  100 mg Oral Daily  . aspirin  81 mg Oral Daily  . atorvastatin  40 mg Oral Daily  . Chlorhexidine Gluconate Cloth  6 each Topical Q0600  . darbepoetin (ARANESP) injection - DIALYSIS  150 mcg Intravenous Q Tue-HD  . feeding supplement (ENSURE ENLIVE)  237 mL Oral TID BM  . feeding supplement (PRO-STAT SUGAR FREE 64)  30 mL Oral BID  . metoCLOPramide  5 mg Oral BID WC  . metoprolol succinate  25 mg Oral Daily  . multivitamin  1 tablet Oral QHS  . pantoprazole  40 mg Oral Daily  . predniSONE  5 mg Oral Q breakfast  . sodium chloride flush  3 mL Intravenous Q12H  . warfarin  6 mg Oral ONCE-1800  . Warfarin - Pharmacist Dosing Inpatient   Does not apply q1800     Dialysis Orders: TTSG/O -[facility] when COVID 19 negative 4 hr 15 min 180NRE 400/800 80.5 kg 2.0 K/ 2.0 Ca R AVF/RIJ TDC -Heparin 8000 units IV TIW -Mircera 75 mcg IV q 2 weeks (last dose 07/25/19) -Venofer 50 mg IV weekly -Parsabiv 7.5 mg IV TIW  Assessment/Plan: 1.Acute COVID-19 pneumonia - s/p remdesivir &Decadron. Per primary. Now out of 21 day isolation window but still required to have 2 negative COVID 19 test before he can return to [facility]. Repeat test on 1/14 +. Pt can dialyze at [facility] until COVID tests negative.  2.ESRD- on HD TTS while COVID +. K 4.3 this AM, Plan for HD tomorrow. Have been using AVF but without consistency. Trying  to get [facility] out if he can have 3 consequent successful cannulations. Shortened treatment due to high census.   3. Anemiaof CKD-Hgb improving, now 8.8.No acute blood loss.Increased Aranesp to 129mcg qwk. Transfuse prn.tsat 34%. 4. Secondary hyperparathyroidism- Phos improved to 2.5, CCa 9.2. No binders or VDRA. No parsabiv because not on formulary.  5.HTN/volume- Blood pressure well controlled. Does not appear volume overloaded, titrate down volume as tolerated. Well below EDW, will need to lower at d/c. Post wt 01/21 67.8 kg.  6. Nutrition- Albumin low. Dys 3 diet. Continue renal vit/prostat.  7.LV thrombus- on coumadin. Per primary 8.Acute CVA- etiology unclear. Per primary  Disposition -ok to d/c from renal standpoint. Awaiting SNF placement.  Zymier Rodgers H. Arabelle Bollig NP-C 08/30/2019, 10:32 AM  [employer] [phone]

## 2019-08-30 NOTE — Progress Notes (Signed)
ANTICOAGULATION CONSULT NOTE - Follow Up Consult  Pharmacy Consult for Heparin Indication: h/o LV thrombus  No Known Allergies  Patient Measurements: Height: 5\' 11"  (180.3 cm) Weight: 155 lb 3.3 oz (70.4 kg) IBW/kg (Calculated) : 75.3 Heparin Dosing Weight: 71.8 kg  Vital Signs: Temp: 97.9 F (36.6 C) (01/22 1657) Temp Source: Oral (01/22 1657) BP: 147/73 (01/22 1657) Pulse Rate: 82 (01/22 1657)  Labs: Recent Labs    08/28/19 0242 08/29/19 0559 08/29/19 0700 08/29/19 1252 08/29/19 2249 08/30/19 0551 08/30/19 1816  HGB 8.1*  --  8.9*  --   --  8.8*  --   HCT 25.5*  --  27.4*  --   --  27.8*  --   PLT 238  --  280  --   --  235  --   LABPROT 14.7  --  15.0  --   --  16.5*  --   INR 1.2  --  1.2  --   --  1.3*  --   HEPARINUNFRC 0.65   < >  --    < > 0.35 0.24* 0.27*  CREATININE 6.10*  --  8.05*  --   --  5.83*  --    < > = values in this interval not displayed.    Estimated Creatinine Clearance: 12.4 mL/min (A) (by C-G formula based on SCr of 5.83 mg/dL (H)).    Pt is a 70 year old male with history of renal transplant in 05/2014 and now with ESRD on HD TTS. Pt had been on apixaban for left ventricular thombus, which has been switched to warfarin. Patient was placed on heparin bridge so that the [facility] can be removed. However, patient's AVF was not able to be cannulated on 1/14; therefore, plans have changed to keep the [facility] in for now. Pharmacy has been consulted to resume warfarin dosing and dose heparin. Of note, patient has also had a recent stroke.  Heparin level ~8.5 hrs after heparin infusion was increased to 800 units/hr is 0.27 units/ml, which slightly below the desired goal range for this patient.   INR today was subtherapeutic and slow to trend up (INR 1.3 << 1.2). The patient is on a dysphagia 3 diet with variable intake charted.   CBC is low, but stable. Per RN, no issues with IV or bleeding observed.   Goal of Therapy:  Heparin level 0.3-0.5 units/ml due to  recent CVA Monitor platelets by anticoagulation protocol: Yes   Plan:  Increase heparin infusion to 900 units/hr Check 8-hr heparin level Warfarin 6 mg x 1 dose at 1800 today (completed) Monitor daily heparin level, INR, CBC Monitor for signs/symptoms of bleeding  Thank you for allowing pharmacy to be a part of this patient's care.  Gillermina Hu, PharmD, BCPS, [facility] Clinical Pharmacist 08/30/2019 8:04 PM   **Pharmacist phone directory can now be found on [email or web address] ([email or web address]).  Listed under [place].

## 2019-08-30 NOTE — Progress Notes (Signed)
  Speech Language Pathology Treatment: Cognitive-Linquistic  Patient Details Name: Jonathan Adcock Sr. MRN: [id number] DOB: 07-Jul-1951 Today's Date: 08/30/2019 Time: [id number] SLP Time Calculation (min) (ACUTE ONLY): 11 min  Assessment / Plan / Recommendation Clinical Impression  Pt seen for skilled ST targeting cognitive-linguistic skills. Pt agreeable to participation with [name]. Patient resting, watching television. Pt oriented to self, place ("hospital") and time of day ("daytime"). Pt stated city as "[place]" x2, able to correctly state "[place]" when provided a choice of two. Patient stating year as "2008" and month as "September." SLP oriented patient to wall clock in his room that listed the time as well as the month and date. ST provided verbal edu re: orientation facts. Pt nodded. Patient able to correctly answer simple yes/no questions with 100% accuracy, able to answer complex/abstract yes/no questions with 50% accuracy. Pt able to follow simple one step verbal commands in 4/4 opportunities, but then asking "why are you asking me all these questions?" and appearing to become agitated. Patient provided education re: call bell use, how to locate call bell and when to use it. Pt verbalized he would use call bell if he needed a nurse. Shortened session as patient appearing to become slightly agitated. ST to follow as per POC.  HPI HPI: Jonathan Rigo Sr. is a 69 y.o. male with history of ESRD on hemodialysis, LV thrombus, renal transplant, stroke, hypertension diabetes mellitus was having increasing difficulty swallowing over the last 2 weeks as noticed by patient's wife (pocketing his food. Per chart 6 days ago diagnosed with Covid at the time he was not symptomatic. Now more short of breath and lethargic and confused. 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        SLP Plan  Continue with current plan of care       Recommendations  Diet recommendations: Dysphagia 3 (mechanical soft);Thin liquid                Follow up Recommendations: Skilled Nursing facility;24 hour supervision/assistance SLP Visit Diagnosis: Cognitive communication deficit [id number]) Plan: Continue with current plan of care       Robinson, M.Ed., [facility] Acute Rehabilitation [phone]: Acute Rehab office [phone] - pager    Dawana Asper 08/30/2019, 11:07 AM

## 2019-08-31 LAB — RENAL FUNCTION PANEL
Albumin: 2.4 g/dL — ABNORMAL LOW (ref 3.5–5.0)
Anion gap: 11 (ref 5–15)
BUN: 44 mg/dL — ABNORMAL HIGH (ref 8–23)
CO2: 24 mmol/L (ref 22–32)
Calcium: 8.4 mg/dL — ABNORMAL LOW (ref 8.9–10.3)
Chloride: 102 mmol/L (ref 98–111)
Creatinine, Ser: 7.63 mg/dL — ABNORMAL HIGH (ref 0.61–1.24)
GFR calc Af Amer: 8 mL/min — ABNORMAL LOW (ref >60)
GFR calc non Af Amer: 7 mL/min — ABNORMAL LOW (ref >60)
Glucose, Bld: 185 mg/dL — ABNORMAL HIGH (ref 70–99)
Phosphorus: 3.1 mg/dL (ref 2.5–4.6)
Potassium: 4.8 mmol/L (ref 3.5–5.1)
Sodium: 137 mmol/L (ref 135–145)

## 2019-08-31 LAB — CBC
HCT: 27.8 % — ABNORMAL LOW (ref 39.0–52.0)
Hemoglobin: 8.6 g/dL — ABNORMAL LOW (ref 13.0–17.0)
MCH: 33.5 pg (ref 26.0–34.0)
MCHC: 30.9 g/dL (ref 30.0–36.0)
MCV: 108.2 fL — ABNORMAL HIGH (ref 80.0–100.0)
Platelets: 260 10*3/uL (ref 150–400)
RBC: 2.57 MIL/uL — ABNORMAL LOW (ref 4.22–5.81)
RDW: 18.2 % — ABNORMAL HIGH (ref 11.5–15.5)
WBC: 8.3 10*3/uL (ref 4.0–10.5)
nRBC: 0.5 % — ABNORMAL HIGH (ref 0.0–0.2)

## 2019-08-31 LAB — HEPARIN LEVEL (UNFRACTIONATED)
Heparin Unfractionated: 0.24 IU/mL — ABNORMAL LOW (ref 0.30–0.70)
Heparin Unfractionated: 0.55 IU/mL (ref 0.30–0.70)

## 2019-08-31 LAB — GLUCOSE, CAPILLARY
Glucose-Capillary: 173 mg/dL — ABNORMAL HIGH (ref 70–99)
Glucose-Capillary: 189 mg/dL — ABNORMAL HIGH (ref 70–99)
Glucose-Capillary: 220 mg/dL — ABNORMAL HIGH (ref 70–99)
Glucose-Capillary: 309 mg/dL — ABNORMAL HIGH (ref 70–99)

## 2019-08-31 LAB — PROTIME-INR
INR: 1.7 — ABNORMAL HIGH (ref 0.8–1.2)
Prothrombin Time: 19.9 seconds — ABNORMAL HIGH (ref 11.4–15.2)

## 2019-08-31 MED ORDER — WARFARIN SODIUM 4 MG PO TABS
4.0000 mg | ORAL_TABLET | Freq: Once | ORAL | Status: AC
  Administered 2019-08-31: 4 mg via ORAL
  Filled 2019-08-31: qty 1

## 2019-08-31 NOTE — Progress Notes (Addendum)
ANTICOAGULATION CONSULT NOTE - Follow Up Consult  Pharmacy Consult for Heparin Indication: h/o LV thrombus  No Known Allergies  Patient Measurements: Height: 5\' 11"  (180.3 cm) Weight: 155 lb 3.3 oz (70.4 kg) IBW/kg (Calculated) : 75.3 Heparin Dosing Weight: 71.8 kg  Vital Signs: Temp: 98 F (36.7 C) (01/23 0533) BP: 149/80 (01/23 0533) Pulse Rate: 101 (01/23 0533)  Labs: Recent Labs    08/29/19 0700 08/29/19 1252 08/30/19 0551 08/30/19 1816 08/31/19 0615  HGB 8.9*  --  8.8*  --  8.6*  HCT 27.4*  --  27.8*  --  27.8*  PLT 280  --  235  --  260  LABPROT 15.0  --  16.5*  --   --   INR 1.2  --  1.3*  --   --   HEPARINUNFRC  --    < > 0.24* 0.27* 0.24*  CREATININE 8.05*  --  5.83*  --  7.63*   < > = values in this interval not displayed.    Estimated Creatinine Clearance: 9.5 mL/min (A) (by C-G formula based on SCr of 7.63 mg/dL (H)).  Assessment:  Patient is a 69 year old male with history of renal transplant in 05/2014 and now with ESRD on HD TTS. Pt had been on apixaban for left ventricular thombus, which has been switched to warfarin. Patient was placed on heparin bridge so that the [facility] can be removed. However, patient's AVF was not able to be cannulated on 1/14; therefore, plans have changed to keep the [facility] in for now. Pharmacy has been consulted to resume warfarin dosing and dose heparin. Of note, patient has also had a recent stroke.  Heparin level of 0.24 is subtherapeutic on heparin 900 units/hr. Hgb of 8.6 and plt of 260 are stable. Per RN no issues with infusion or IV access. Confirmed heparin running at ordered rate of 900 units/hr. No reported bleeding   INR today is 1.7 is subtherapeutic. All doses of warfarin ordered have been administered.     Goal of Therapy:  Heparin level 0.3-0.5 units/ml due to recent CVA Monitor platelets by anticoagulation protocol: Yes   Plan:  Increase heparin to 1000 units/hr  Check heparin level at 1600  Warfarin 4mg  x1 today   Monitor daily heparin level, INR, CBC Monitor for signs/symptoms of bleeding  Thank you for allowing pharmacy to be a part of this patient's care.  Cristela Felt, PharmD PGY1 Pharmacy Resident [employer]: [phone]  08/31/2019 7:20 AM

## 2019-08-31 NOTE — Plan of Care (Signed)
  Problem: Coping: Goal: Psychosocial and spiritual needs will be supported Outcome: Progressing   Problem: Health Behavior/Discharge Planning: Goal: Ability to manage health-related needs will improve Outcome: Progressing

## 2019-08-31 NOTE — Progress Notes (Signed)
ANTICOAGULATION CONSULT NOTE - Follow Up Consult  Pharmacy Consult for Heparin Indication: h/o LV thrombus  No Known Allergies  Patient Measurements: Height: 5\' 11"  (180.3 cm) Weight: 153 lb 7 oz (69.6 kg) IBW/kg (Calculated) : 75.3 Heparin Dosing Weight: 71.8 kg  Vital Signs: Temp: 97.9 F (36.6 C) (01/23 1545) Temp Source: Oral (01/23 1545) BP: 118/73 (01/23 1545) Pulse Rate: 97 (01/23 1545)  Labs: Recent Labs    08/29/19 0700 08/29/19 1252 08/30/19 0551 08/30/19 0551 08/30/19 1816 08/31/19 0615 08/31/19 1624  HGB 8.9*  --  8.8*  --   --  8.6*  --   HCT 27.4*  --  27.8*  --   --  27.8*  --   PLT 280  --  235  --   --  260  --   LABPROT 15.0  --  16.5*  --   --  19.9*  --   INR 1.2  --  1.3*  --   --  1.7*  --   HEPARINUNFRC  --    < > 0.24*   < > 0.27* 0.24* 0.55  CREATININE 8.05*  --  5.83*  --   --  7.63*  --    < > = values in this interval not displayed.    Estimated Creatinine Clearance: 9.4 mL/min (A) (by C-G formula based on SCr of 7.63 mg/dL (H)).  Assessment:  Patient is a 69 year old male with history of renal transplant in 05/2014 and now with ESRD on HD TTS. Pt had been on apixaban for left ventricular thombus, which has been switched to warfarin. Patient was placed on heparin bridge so that the [facility] can be removed. However, patient's AVF was not able to be cannulated on 1/14; therefore, plans have changed to keep the [facility] in for now. Pharmacy has been consulted to resume warfarin dosing and dose heparin. Of note, patient has also had a recent stroke.  Heparin level is slightly supra-therapeutic at 0.55 on heparin 1000 units/hr.    Goal of Therapy:  Heparin level 0.3-0.5 units/ml due to recent CVA Monitor platelets by anticoagulation protocol: Yes   Plan:  Decrease heparin slightly to 950 units/hr  Check next heparin level with AM labs Monitor daily heparin level, CBC Monitor for signs/symptoms of bleeding  Thank you for allowing pharmacy to be a  part of this patient's care.  Vertis Kelch, PharmD, [facility] PGY2 Cardiology Pharmacy Resident Phone [phone] 08/31/2019       5:31 PM  Please check [email or web address] for unit-specific pharmacist phone numbers

## 2019-08-31 NOTE — Progress Notes (Addendum)
[facility] Progress Note   Subjective: He is speaking to me today! No C/Os. Eating B'fast. Says he feels "OK". HD later today on schedule.     Objective Vitals:   08/30/19 0924 08/30/19 1657 08/30/19 2028 08/31/19 0533  BP: 116/66 (!) 147/73 122/82 (!) 149/80  Pulse: 82 82 87 (!) 101  Resp: 20 16 18 18   Temp: 98 F (36.7 C) 97.9 F (36.6 C) 98 F (36.7 C) 98 F (36.7 C)  TempSrc: Oral Oral    SpO2: 97% 98% 100% 100%  Weight:      Height:       Physical Exam General: Chronically ill irritable appearing male in NAD Heart: S1,S2 RRR Lungs: CTAB No WOB Abdomen: S, NT Extremities: No LE edema Dialysis Access: RIJ TDC visible, Drsg CDI. R AVF   Additional Objective Labs: Basic Metabolic Panel: Recent Labs  Lab 08/29/19 0700 08/30/19 0551 08/31/19 0615  NA 137 133* 137  K 4.6 4.3 4.8  CL 99 97* 102  CO2 25 25 24   GLUCOSE 111* 171* 185*  BUN 58* 34* 44*  CREATININE 8.05* 5.83* 7.63*  CALCIUM 8.5* 8.1* 8.4*  PHOS 3.4 2.9 3.1   Liver Function Tests: Recent Labs  Lab 08/29/19 0700 08/30/19 0551 08/31/19 0615  ALBUMIN 2.4* 2.4* 2.4*   No results for input(s): LIPASE, AMYLASE in the last 168 hours. CBC: Recent Labs  Lab 08/27/19 0000 08/27/19 0000 08/28/19 0242 08/28/19 0242 08/29/19 0700 08/30/19 0551 08/31/19 0615  WBC 9.1   < > 8.9   < > 8.8 9.0 8.3  HGB 7.6*   < > 8.1*   < > 8.9* 8.8* 8.6*  HCT 23.4*   < > 25.5*   < > 27.4* 27.8* 27.8*  MCV 103.5*  --  107.1*  --  107.5* 106.5* 108.2*  PLT 207   < > 238   < > 280 235 260   < > = values in this interval not displayed.   Blood Culture    Component Value Date/Time   SDES BLOOD SITE NOT SPECIFIED 07/30/2019 1843   SPECREQUEST  07/30/2019 1843    BOTTLES DRAWN AEROBIC AND ANAEROBIC Blood Culture results may not be optimal due to an inadequate volume of blood received in culture bottles   CULT  07/30/2019 1843    NO GROWTH 5 DAYS Performed at [facility], [place] [address], [place], [place] [address]    REPTSTATUS 08/04/2019 FINAL 07/30/2019 1843    Cardiac Enzymes: No results for input(s): CKTOTAL, CKMB, CKMBINDEX, TROPONINI in the last 168 hours. CBG: Recent Labs  Lab 08/29/19 2109 08/30/19 0722 08/30/19 1135 08/30/19 2028 08/31/19 0552  GLUCAP 234* 163* 219* 322* 173*   Iron Studies: No results for input(s): IRON, TIBC, TRANSFERRIN, FERRITIN in the last 72 hours. @lablastinr3 @ Studies/Results: No results found. Medications: . heparin 1,000 Units/hr (08/31/19 0810)   .  stroke: mapping our early stages of recovery book   Does not apply Once  . allopurinol  100 mg Oral Daily  . aspirin  81 mg Oral Daily  . atorvastatin  40 mg Oral Daily  . Chlorhexidine Gluconate Cloth  6 each Topical Q0600  . darbepoetin (ARANESP) injection - DIALYSIS  150 mcg Intravenous Q Tue-HD  . feeding supplement (ENSURE ENLIVE)  237 mL Oral TID BM  . feeding supplement (PRO-STAT SUGAR FREE 64)  30 mL Oral BID  . metoCLOPramide  5 mg Oral BID WC  . metoprolol succinate  25  mg Oral Daily  . multivitamin  1 tablet Oral QHS  . pantoprazole  40 mg Oral Daily  . predniSONE  5 mg Oral Q breakfast  . sodium chloride flush  3 mL Intravenous Q12H  . warfarin  4 mg Oral ONCE-1800  . Warfarin - Pharmacist Dosing Inpatient   Does not apply q1800     Dialysis Orders: TTSG/O -[facility] when COVID 19 negative 4 hr 15 min 180NRE 400/800 80.5 kg 2.0 K/ 2.0 Ca R AVF/RIJ TDC -Heparin 8000 units IV TIW -Mircera 75 mcg IV q 2 weeks (last dose 07/25/19) -Venofer 50 mg IV weekly -Parsabiv 7.5 mg IV TIW  Assessment/Plan: 1.Acute COVID-19 pneumonia - s/p remdesivir &Decadron. Per primary. Now out of 21 day isolation window but still required to have 2 negative COVID 19 test before he can [facility] to [facility]. Repeat test on 1/14 +. Pt can dialyze at [facility] until COVID tests negative.  2.ESRD- on HD TTS while COVID +. K4.3 this AM, Plan for HD today. Has been using AVF but  without consistency. Trying to get [facility] out if he can have 3 consequent successful cannulations. Shortened treatment due to high census. 3. Anemiaof CKD-Hgbimproving, now 8.6.No acute blood loss.IncreasedAranesp to 171mcg qwk. Transfuse prn.tsat 34%. 4. Secondary hyperparathyroidism- Phos improved to3.1, CCa 9.2. No binders or VDRA. No parsabiv because not on formulary.  5.HTN/volume- Blood pressure well controlled. Does not appear volume overloaded, titrate down volume as tolerated. Well below EDW, will need to lower at d/c. Post wt 01/21 67.8 kg.  6. Nutrition- Albumin low. Dys 3 diet. Continue renal vit/prostat.  7.LV thrombus-  Heparin bridging to coumadin. Per primary 8.Acute CVA- etiology unclear. Per primary  Disposition: SNF placement when bed available/INR therapeutic.  Rita H. Brown NP-C 08/31/2019, 9:00 AM  [employer] [phone]   I have seen and examined this patient and agree with the plan of care. Plan on HD today. Feels improved and denies f/c/n/v.   Dwana Melena, MD 08/31/2019, 12:13 PM

## 2019-08-31 NOTE — Progress Notes (Signed)
[facility]  PROGRESS NOTE    Jonathan Sebo Sr.  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 Per CM working on SNF option d/t COVID history. Otherwise medically stable for d/c. INR has moved up to 1.7. Monitor.     Assessment & Plan:   Principal Problem:   Acute respiratory failure due to COVID-19 [facility]) Active Problems:   Chronic hepatitis C without hepatic coma ([name])   Diabetes mellitus type 2, uncomplicated ([name])   History of coronary artery disease   Hypertension   ESRD (end stage renal disease) on dialysis ([name])   Acute respiratory failure with hypoxia ([name])   Cerebral thrombosis with cerebral infarction   Cerebral embolism with cerebral infarction  COVID-19 pneumonia - Resolved - testing is still positive and this is precluding outpatient HD center placement; working on options - needs SNF; this is the hanging point d/t COVID history in combination with HD needs  Acute strokes left pons left occipital - Etiology unclear but though to be secondary to Covid versus LV thrombus - continue ASA, lipitor, coumadin (INR is still not therapeutic) - need skilled facility placement  ESRD/HD Hypophosphatemia - Prior was on MWF; further management per them - HD tomorrow - needs TDC removed - per neprho  note, may be able to dialysis at [facility]  LV thrombus - coumadin; bridging with Heparin til INR 2.0     - INR 1.7 this AM  Prior cadaveric renal transplant - continue prednisone 5  No acute events ON. INR is moving up (1.7 this AM). Working on placement options.   DVT prophylaxis: heparin/coumadin Code Status: FULL Family Communication: None at bedside   Disposition Plan: To SNF when available and after INR is therapeutic.  Consultants:   Neprhology  ROS:  Denies CP, dyspnea, ab pain, N, V . Remainder 10-pt ROS is negative for all not previously mentioned.  Subjective: "Can you help with this?"  Objective: Vitals:   08/31/19 1245 08/31/19 1300 08/31/19 1330 08/31/19 1400  BP: (!) 161/84 (!) 167/81 (!) 143/69 136/76  Pulse: 92 90 91 90  Resp:      Temp:      TempSrc:      SpO2:      Weight:      Height:        Intake/Output Summary (Last 24 hours) at 08/31/2019 1442 Last data filed at 08/31/2019 1207 Gross per 24 hour  Intake 885 ml  Output 250 ml  Net 635 ml   Filed Weights   08/29/19 1023 08/29/19 2109 08/31/19 1237  Weight: 67.8 kg 70.4 kg 71.4 kg    Examination:  General: 69 y.o. male resting in bed in NAD Cardiovascular: RRR, +S1, S2, no m/g/r, equal pulses throughout Respiratory: CTABL, no w/r/r, normal WOB GI: BS+, NDNT, no masses noted, no organomegaly noted MSK: No e/c/c Neuro: Alert to name, follows commands Psyc: calm/cooperative   Data Reviewed: I  have personally reviewed following labs and imaging studies.  CBC: Recent Labs  Lab 08/27/19 0000 08/28/19 0242 08/29/19 0700 08/30/19 0551 08/31/19 0615  WBC 9.1 8.9 8.8 9.0 8.3  HGB 7.6* 8.1* 8.9* 8.8* 8.6*  HCT 23.4* 25.5* 27.4* 27.8* 27.8*  MCV 103.5* 107.1* 107.5* 106.5* 108.2*  PLT 207 238 280 235 [id number]   Basic Metabolic Panel: Recent Labs  Lab 08/27/19 0000 08/28/19 0242 08/29/19 0700 08/30/19 0551 08/31/19 0615  NA 133* 134* 137 133* 137  K 5.1 5.2* 4.6 4.3  4.8  CL 96* 100 99 97* 102  CO2 25 25 25 25 24   GLUCOSE 191* 195* 111* 171* 185*  BUN 82* 50* 58* 34* 44*  CREATININE 8.75* 6.10* 8.05* 5.83* 7.63*  CALCIUM 8.2* 7.8* 8.5* 8.1* 8.4*  MG  --   --   --  1.9  --   PHOS 3.9 2.5 3.4 2.9 3.1   GFR: Estimated Creatinine Clearance: 9.6 mL/min (A) (by C-G formula based on SCr of 7.63 mg/dL (H)). Liver Function Tests: Recent Labs  Lab 08/27/19 0000 08/28/19 0242 08/29/19 0700 08/30/19 0551 08/31/19 0615  ALBUMIN 2.3* 2.3* 2.4* 2.4* 2.4*   No results for input(s): LIPASE, AMYLASE in the last 168 hours. No results for input(s): AMMONIA in the last 168 hours. Coagulation Profile: Recent Labs  Lab 08/27/19 0000 08/28/19 0242 08/29/19 0700 08/30/19 0551 08/31/19 0615  INR 1.2 1.2 1.2 1.3* 1.7*   Cardiac Enzymes: No results for input(s): CKTOTAL, CKMB, CKMBINDEX, TROPONINI in the last 168 hours. BNP (last 3 results) No results for input(s): PROBNP in the last 8760 hours. HbA1C: No results for input(s): HGBA1C in the last 72 hours. CBG: Recent Labs  Lab 08/30/19 0722 08/30/19 1135 08/30/19 2028 08/31/19 0552 08/31/19 1123  GLUCAP 163* 219* 322* 173* 189*   Lipid Profile: No results for input(s): CHOL, HDL, LDLCALC, TRIG, CHOLHDL, LDLDIRECT in the last 72 hours. Thyroid Function Tests: No results for input(s): TSH, T4TOTAL, FREET4, T3FREE, THYROIDAB in the last 72 hours. Anemia Panel: No results for input(s): VITAMINB12, FOLATE, FERRITIN, TIBC, IRON, RETICCTPCT in the last 72 hours. Sepsis Labs: No results for input(s): PROCALCITON, LATICACIDVEN in the last 168 hours.  Recent Results (from the past 240 hour(s))  SARS CORONAVIRUS 2 (TAT 6-24 HRS) Nasopharyngeal Nasopharyngeal Swab     Status: Abnormal   Collection Time: 08/22/19  7:31 AM   Specimen: Nasopharyngeal Swab  Result Value Ref Range Status   SARS Coronavirus 2 POSITIVE (A) NEGATIVE Final    Comment: RESULT CALLED TO, READ BACK BY AND VERIFIED WITH: A. [employer]  15:50 08/22/19 ([email or web address]) (NOTE) SARS-CoV-2 target nucleic acids are DETECTED. The SARS-CoV-2 RNA is generally detectable in upper and lower respiratory specimens during the acute phase of infection. Positive results are indicative of the presence of SARS-CoV-2 RNA. Clinical correlation with patient history and other diagnostic information is  necessary to determine patient infection status. Positive results do not rule out bacterial infection or co-infection with other viruses.  The expected result is Negative. Fact Sheet for Patients: SugarRoll.be Fact Sheet for Healthcare Providers: https://www.woods-mathews.com/ This test is not yet approved or cleared by the [place] FDA and  has been authorized for detection and/or diagnosis of SARS-CoV-2 by FDA under an Emergency Use Authorization (EUA). This EUA will remain  in effect (meaning this test can be used) for the d uration of the COVID-19 declaration under Section 564(b)(1) of the Act, 21 U.S.C. section 360bbb-3(b)(1), unless the authorization is terminated or revoked  sooner. Performed at [facility], [place] [address], [place], [place] [address]       Radiology Studies: No results found.   Scheduled Meds: .  stroke: mapping our early stages of recovery book   Does not apply Once  . allopurinol  100 mg Oral Daily  . aspirin  81 mg Oral Daily  . atorvastatin  40 mg Oral Daily  . Chlorhexidine Gluconate Cloth  6 each Topical Q0600  . darbepoetin (ARANESP) injection - DIALYSIS  150 mcg Intravenous Q Tue-HD  . feeding supplement (ENSURE ENLIVE)  237 mL Oral TID BM  . feeding supplement (PRO-STAT SUGAR FREE 64)  30 mL Oral BID  . metoCLOPramide  5 mg Oral BID WC  . metoprolol succinate  25 mg Oral Daily  . multivitamin  1 tablet Oral QHS  . pantoprazole  40 mg Oral Daily  . predniSONE  5 mg Oral Q breakfast  . sodium chloride flush  3 mL Intravenous Q12H  . warfarin  4 mg Oral  ONCE-1800  . Warfarin - Pharmacist Dosing Inpatient   Does not apply q1800   Continuous Infusions: . heparin 1,000 Units/hr (08/31/19 0810)     LOS: 32 days    Time spent: 15 minutes spent in the coordination of care today.    Jonnie Finner, DO [facility]  If 7PM-7AM, please contact night-coverage [email or web address] 08/31/2019, 2:42 PM

## 2019-08-31 NOTE — Progress Notes (Signed)
Patient refused blood draw from the lab. Explained the importance of lab to be drawn. Pt. Adamantly refused.

## 2019-09-01 DIAGNOSIS — Z940 Kidney transplant status: Secondary | ICD-10-CM

## 2019-09-01 DIAGNOSIS — J1282 Pneumonia due to coronavirus disease 2019: Secondary | ICD-10-CM

## 2019-09-01 DIAGNOSIS — I513 Intracardiac thrombosis, not elsewhere classified: Secondary | ICD-10-CM

## 2019-09-01 DIAGNOSIS — R739 Hyperglycemia, unspecified: Secondary | ICD-10-CM

## 2019-09-01 DIAGNOSIS — R7303 Prediabetes: Secondary | ICD-10-CM

## 2019-09-01 LAB — MAGNESIUM: Magnesium: 1.8 mg/dL (ref 1.7–2.4)

## 2019-09-01 LAB — CBC
HCT: 27.2 % — ABNORMAL LOW (ref 39.0–52.0)
Hemoglobin: 8.6 g/dL — ABNORMAL LOW (ref 13.0–17.0)
MCH: 34.1 pg — ABNORMAL HIGH (ref 26.0–34.0)
MCHC: 31.6 g/dL (ref 30.0–36.0)
MCV: 107.9 fL — ABNORMAL HIGH (ref 80.0–100.0)
Platelets: 257 10*3/uL (ref 150–400)
RBC: 2.52 MIL/uL — ABNORMAL LOW (ref 4.22–5.81)
RDW: 18.6 % — ABNORMAL HIGH (ref 11.5–15.5)
WBC: 8.6 10*3/uL (ref 4.0–10.5)
nRBC: 0.6 % — ABNORMAL HIGH (ref 0.0–0.2)

## 2019-09-01 LAB — PROTIME-INR
INR: 1.9 — ABNORMAL HIGH (ref 0.8–1.2)
Prothrombin Time: 21.6 seconds — ABNORMAL HIGH (ref 11.4–15.2)

## 2019-09-01 LAB — RENAL FUNCTION PANEL
Albumin: 2.3 g/dL — ABNORMAL LOW (ref 3.5–5.0)
Anion gap: 10 (ref 5–15)
BUN: 29 mg/dL — ABNORMAL HIGH (ref 8–23)
CO2: 25 mmol/L (ref 22–32)
Calcium: 8.1 mg/dL — ABNORMAL LOW (ref 8.9–10.3)
Chloride: 101 mmol/L (ref 98–111)
Creatinine, Ser: 5.43 mg/dL — ABNORMAL HIGH (ref 0.61–1.24)
GFR calc Af Amer: 12 mL/min — ABNORMAL LOW (ref >60)
GFR calc non Af Amer: 10 mL/min — ABNORMAL LOW (ref >60)
Glucose, Bld: 191 mg/dL — ABNORMAL HIGH (ref 70–99)
Phosphorus: 2.7 mg/dL (ref 2.5–4.6)
Potassium: 4.7 mmol/L (ref 3.5–5.1)
Sodium: 136 mmol/L (ref 135–145)

## 2019-09-01 LAB — GLUCOSE, CAPILLARY
Glucose-Capillary: 165 mg/dL — ABNORMAL HIGH (ref 70–99)
Glucose-Capillary: 239 mg/dL — ABNORMAL HIGH (ref 70–99)
Glucose-Capillary: 355 mg/dL — ABNORMAL HIGH (ref 70–99)
Glucose-Capillary: 296 mg/dL — ABNORMAL HIGH (ref 70–99)

## 2019-09-01 LAB — HEPARIN LEVEL (UNFRACTIONATED)
Heparin Unfractionated: 0.4 IU/mL (ref 0.30–0.70)
Heparin Unfractionated: 0.63 IU/mL (ref 0.30–0.70)
Heparin Unfractionated: 0.59 IU/mL (ref 0.30–0.70)

## 2019-09-01 MED ORDER — INSULIN ASPART 100 UNIT/ML ~~LOC~~ SOLN
0.0000 [IU] | Freq: Three times a day (TID) | SUBCUTANEOUS | Status: DC
  Administered 2019-09-01: 2 [IU] via SUBCUTANEOUS
  Administered 2019-09-01: 5 [IU] via SUBCUTANEOUS
  Administered 2019-09-02: 3 [IU] via SUBCUTANEOUS
  Administered 2019-09-02: 2 [IU] via SUBCUTANEOUS
  Administered 2019-09-02 – 2019-09-04 (×3): 1 [IU] via SUBCUTANEOUS
  Administered 2019-09-05: 2 [IU] via SUBCUTANEOUS
  Administered 2019-09-06 (×2): 1 [IU] via SUBCUTANEOUS
  Administered 2019-09-06: 3 [IU] via SUBCUTANEOUS
  Administered 2019-09-07: 1 [IU] via SUBCUTANEOUS
  Administered 2019-09-08: 3 [IU] via SUBCUTANEOUS
  Administered 2019-09-08: 1 [IU] via SUBCUTANEOUS
  Administered 2019-09-09: 2 [IU] via SUBCUTANEOUS
  Administered 2019-09-09: 3 [IU] via SUBCUTANEOUS
  Administered 2019-09-09 – 2019-09-11 (×4): 1 [IU] via SUBCUTANEOUS
  Administered 2019-09-11: 2 [IU] via SUBCUTANEOUS
  Administered 2019-09-12: 3 [IU] via SUBCUTANEOUS

## 2019-09-01 MED ORDER — WARFARIN SODIUM 4 MG PO TABS
4.0000 mg | ORAL_TABLET | Freq: Once | ORAL | Status: AC
  Administered 2019-09-01: 4 mg via ORAL
  Filled 2019-09-01: qty 1

## 2019-09-01 NOTE — Progress Notes (Signed)
ANTICOAGULATION CONSULT NOTE - Follow Up Consult  Pharmacy Consult for Heparin Indication: h/o LV thrombus  No Known Allergies  Patient Measurements: Height: 5\' 11"  (180.3 cm) Weight: 153 lb 7 oz (69.6 kg) IBW/kg (Calculated) : 75.3 Heparin Dosing Weight: 71.8 kg  Vital Signs: Temp: 98.6 F (37 C) (01/24 2128) Temp Source: Oral (01/24 2128) BP: 139/75 (01/24 2128) Pulse Rate: 97 (01/24 2128)  Labs: Recent Labs    08/30/19 0551 08/30/19 1816 08/31/19 0615 08/31/19 1624 09/01/19 0521 09/01/19 1237 09/01/19 2214  HGB 8.8*  --  8.6*  --  8.6*  --   --   HCT 27.8*  --  27.8*  --  27.2*  --   --   PLT 235  --  260  --  257  --   --   LABPROT 16.5*  --  19.9*  --  21.6*  --   --   INR 1.3*  --  1.7*  --  1.9*  --   --   HEPARINUNFRC 0.24*   < > 0.24*   < > 0.40 0.63 0.59  CREATININE 5.83*  --  7.63*  --  5.43*  --   --    < > = values in this interval not displayed.    Estimated Creatinine Clearance: 13.2 mL/min (A) (by C-G formula based on SCr of 5.43 mg/dL (H)).  Assessment:  Patient is a 69 year old male with history of renal transplant in 05/2014 and now with ESRD on HD TTS. Pt had been on apixaban for left ventricular thombus, which has been switched to warfarin. Patient was placed on heparin bridge so that the [facility] can be removed. However, patient's AVF was not able to be cannulated on 1/14; therefore, plans have changed to keep the [facility] in for now. Pharmacy has been consulted to resume warfarin dosing and dose heparin. Of note, patient has also had a recent stroke.  Heparin level remains above goal at 0.59 despite rate decrease this AM   Goal of Therapy:  Heparin level 0.3-0.5 units/ml due to recent CVA Monitor platelets by anticoagulation protocol: Yes   Plan:  Decrease heparin to 900 units/hr  Monitor daily heparin level, INR, CBC Monitor for signs/symptoms of bleeding  Thank you for allowing pharmacy to be a part of this patient's care.  Vertis Kelch,  PharmD, BCPS PGY2 Cardiology Pharmacy Resident Phone [phone] 09/01/2019       10:39 PM  Please check [email or web address] for unit-specific pharmacist phone numbers

## 2019-09-01 NOTE — Plan of Care (Signed)
  Problem: Self-Care: Goal: Ability to participate in self-care as condition permits will improve Outcome: Progressing   Problem: Health Behavior/Discharge Planning: Goal: Ability to manage health-related needs will improve Outcome: Progressing

## 2019-09-01 NOTE — Progress Notes (Signed)
ANTICOAGULATION CONSULT NOTE - Follow Up Consult  Pharmacy Consult for Heparin Indication: h/o LV thrombus  No Known Allergies  Patient Measurements: Height: 5\' 11"  (180.3 cm) Weight: 153 lb 7 oz (69.6 kg) IBW/kg (Calculated) : 75.3 Heparin Dosing Weight: 71.8 kg  Vital Signs: Temp: 98.8 F (37.1 C) (01/24 0909) Temp Source: Oral (01/24 0909) BP: 128/71 (01/24 0909) Pulse Rate: 88 (01/24 0909)  Labs: Recent Labs    08/30/19 0551 08/30/19 1816 08/31/19 0615 08/31/19 0615 08/31/19 1624 09/01/19 0521 09/01/19 1237  HGB 8.8*  --  8.6*  --   --  8.6*  --   HCT 27.8*  --  27.8*  --   --  27.2*  --   PLT 235  --  260  --   --  257  --   LABPROT 16.5*  --  19.9*  --   --  21.6*  --   INR 1.3*  --  1.7*  --   --  1.9*  --   HEPARINUNFRC 0.24*   < > 0.24*   < > 0.55 0.40 0.63  CREATININE 5.83*  --  7.63*  --   --  5.43*  --    < > = values in this interval not displayed.    Estimated Creatinine Clearance: 13.2 mL/min (A) (by C-G formula based on SCr of 5.43 mg/dL (H)).  Assessment:  Patient is a 69 year old male with history of renal transplant in 05/2014 and now with ESRD on HD TTS. Pt had been on apixaban for left ventricular thombus, which has been switched to warfarin. Patient was placed on heparin bridge so that the [facility] can be removed. However, patient's AVF was not able to be cannulated on 1/14; therefore, plans have changed to keep the [facility] in for now. Pharmacy has been consulted to resume warfarin dosing and dose heparin. Of note, patient has also had a recent stroke.  Heparin level this morning was therapeutic on heparin 1000 units/hr when heparin infusion rate was intended to be decreased yesterday but was not. Confirmatory heparin level this afternoon at 0.63 is supratherapeutic based on this patient's goal of 0.3 to 0.5 due to recent CVA. No reported bleeding per RN.   INR today is 1.9 is subtherapeutic but close to goal of 2-3. All doses of warfarin ordered have been  administered.     Goal of Therapy:  Heparin level 0.3-0.5 units/ml due to recent CVA Monitor platelets by anticoagulation protocol: Yes   Plan:  Decrease heparin to 950 units/hr  Check heparin level at 200 Warfarin 4mg  x1 today  Monitor daily heparin level, INR, CBC Monitor for signs/symptoms of bleeding  Thank you for allowing pharmacy to be a part of this patient's care.  Cristela Felt, PharmD PGY1 Pharmacy Resident [employer]: [phone]  09/01/2019 1:28 PM

## 2019-09-01 NOTE — Plan of Care (Signed)
  Problem: Health Behavior/Discharge Planning: Goal: Ability to manage health-related needs will improve Outcome: Progressing   Problem: Self-Care: Goal: Ability to participate in self-care as condition permits will improve Outcome: Progressing   

## 2019-09-01 NOTE — Progress Notes (Signed)
Marland Kitchen  PROGRESS NOTE    Jonathan Glinsky Sr.  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 Adding SSI. He is doing well otherwise. INR is 1.9 today. Continue heparin/coumadin per pharm. Working on placement.   Assessment & Plan:   Principal Problem:   Acute respiratory failure due to COVID-19 [facility]) Active Problems:   Chronic hepatitis C without hepatic coma ([name])   Diabetes mellitus type 2, uncomplicated ([place])   History of coronary artery disease   Hypertension   ESRD (end stage renal disease) on dialysis ([name])   Acute respiratory failure with hypoxia ([name])   Cerebral thrombosis with cerebral infarction   Cerebral embolism with cerebral infarction  COVID-19 pneumonia - Resolved - testing is still positive and this is precluding outpatient HD center placement; working on options - needs SNF; this is the hanging point d/t COVID history in combination with HD needs     - no acute events ON.   Acute strokes left pons left occipital - Etiology unclear but though to be secondary to Covid versus LV thrombus - continue ASA, lipitor, coumadin (INR is still not therapeutic) - need skilled facility placement  ESRD/HD Hypophosphatemia - Prior was on MWF; further management per them - HD tomorrow - needs TDC  removed - per neprho note, may be able to dialysis at [facility]  LV thrombus - coumadin; bridging with Heparin til INR 2.0 - INR 1.9 this AM  Prior cadaveric renal transplant - continue prednisone 5  Hyperglycemia Pre-diabetes     - add SSI  No acute events ON. INR is moving up (1.9 this AM). Working on placement options.   DVT prophylaxis: heparin/coumadin Code Status: FULL Family Communication: None at bedside   Disposition Plan: Needs therapeutic INR, then bed availability at SNF  Consultants:   Nephrology   ROS:  Denies CP, ab pain, N, V, dyspnea . Remainder 10-pt ROS is negative for all not previously mentioned.  Subjective: "That's fine."  Objective: Vitals:   08/31/19 2103 09/01/19 0448 09/01/19 0500 09/01/19 0909  BP: (!) 141/66 122/74  128/71  Pulse: (!) 101 99  88  Resp: 17 16  18   Temp: 99.5 F (37.5 C) 99.1 F (37.3 C)  98.8 F (37.1 C)  TempSrc: Oral Oral  Oral  SpO2: 98% 94%  98%  Weight: 69.6 kg  69.6 kg   Height:        Intake/Output Summary (Last 24 hours) at 09/01/2019 1121 Last data filed at 09/01/2019 0900 Gross per 24 hour  Intake 888.83 ml  Output 1650 ml  Net -761.17 ml   Filed Weights   08/31/19 1545 08/31/19 2103 09/01/19 0500  Weight: 69.6 kg 69.6 kg 69.6 kg    Examination:  General: 69 y.o. male resting in bed in NAD Cardiovascular: RRR, +S1, S2, no m/g/r Respiratory: CTABL, no w/r/r, normal WOB GI: BS+, NDNT, no masses noted, no organomegaly noted MSK: No  e/c/c Neuro: Alert to name, follows commands Psyc: calm/cooperative   Data Reviewed: I have personally reviewed following labs and imaging studies.  CBC: Recent Labs  Lab 08/28/19 0242 08/29/19 0700 08/30/19 0551 08/31/19 0615 09/01/19 0521  WBC 8.9 8.8 9.0 8.3 8.6  HGB 8.1* 8.9* 8.8* 8.6* 8.6*  HCT 25.5* 27.4* 27.8* 27.8* 27.2*  MCV 107.1* 107.5* 106.5* 108.2* 107.9*  PLT 238 280 235 260 [id number]   Basic Metabolic Panel: Recent Labs   Lab 08/28/19 0242 08/29/19 0700 08/30/19 0551 08/31/19 0615 09/01/19 0521  NA 134* 137 133* 137 136  K 5.2* 4.6 4.3 4.8 4.7  CL 100 99 97* 102 101  CO2 25 25 25 24 25   GLUCOSE 195* 111* 171* 185* 191*  BUN 50* 58* 34* 44* 29*  CREATININE 6.10* 8.05* 5.83* 7.63* 5.43*  CALCIUM 7.8* 8.5* 8.1* 8.4* 8.1*  MG  --   --  1.9  --  1.8  PHOS 2.5 3.4 2.9 3.1 2.7   GFR: Estimated Creatinine Clearance: 13.2 mL/min (A) (by C-G formula based on SCr of 5.43 mg/dL (H)). Liver Function Tests: Recent Labs  Lab 08/28/19 0242 08/29/19 0700 08/30/19 0551 08/31/19 0615 09/01/19 0521  ALBUMIN 2.3* 2.4* 2.4* 2.4* 2.3*   No results for input(s): LIPASE, AMYLASE in the last 168 hours. No results for input(s): AMMONIA in the last 168 hours. Coagulation Profile: Recent Labs  Lab 08/28/19 0242 08/29/19 0700 08/30/19 0551 08/31/19 0615 09/01/19 0521  INR 1.2 1.2 1.3* 1.7* 1.9*   Cardiac Enzymes: No results for input(s): CKTOTAL, CKMB, CKMBINDEX, TROPONINI in the last 168 hours. BNP (last 3 results) No results for input(s): PROBNP in the last 8760 hours. HbA1C: No results for input(s): HGBA1C in the last 72 hours. CBG: Recent Labs  Lab 08/31/19 0552 08/31/19 1123 08/31/19 1656 08/31/19 2117 09/01/19 0648  GLUCAP 173* 189* 220* 309* 165*   Lipid Profile: No results for input(s): CHOL, HDL, LDLCALC, TRIG, CHOLHDL, LDLDIRECT in the last 72 hours. Thyroid Function Tests: No results for input(s): TSH, T4TOTAL, FREET4, T3FREE, THYROIDAB in the last 72 hours. Anemia Panel: No results for input(s): VITAMINB12, FOLATE, FERRITIN, TIBC, IRON, RETICCTPCT in the last 72 hours. Sepsis Labs: No results for input(s): PROCALCITON, LATICACIDVEN in the last 168 hours.  No results found for this or any previous visit (from the past 240 hour(s)).    Radiology Studies: No results found.   Scheduled Meds: .  stroke: mapping our early stages of recovery book   Does not apply Once  . allopurinol   100 mg Oral Daily  . aspirin  81 mg Oral Daily  . atorvastatin  40 mg Oral Daily  . Chlorhexidine Gluconate Cloth  6 each Topical Q0600  . darbepoetin (ARANESP) injection - DIALYSIS  150 mcg Intravenous Q Tue-HD  . feeding supplement (ENSURE ENLIVE)  237 mL Oral TID BM  . feeding supplement (PRO-STAT SUGAR FREE 64)  30 mL Oral BID  . insulin aspart  0-6 Units Subcutaneous TID WC  . metoCLOPramide  5 mg Oral BID WC  . metoprolol succinate  25 mg Oral Daily  . multivitamin  1 tablet Oral QHS  . pantoprazole  40 mg Oral Daily  . predniSONE  5 mg Oral Q breakfast  . sodium chloride flush  3 mL Intravenous Q12H  . warfarin  4 mg Oral ONCE-1800  . Warfarin - Pharmacist Dosing Inpatient   Does not apply q1800   Continuous Infusions: . heparin 1,000 Units/hr (08/31/19 2222)  LOS: 33 days    Time spent: 25 minutes spent in the coordination of care today.    Jonnie Finner, DO [facility]  If 7PM-7AM, please contact night-coverage [email or web address] 09/01/2019, 11:21 AM

## 2019-09-01 NOTE — Progress Notes (Signed)
ANTICOAGULATION CONSULT NOTE - Follow Up Consult  Pharmacy Consult for Heparin Indication: h/o LV thrombus  No Known Allergies  Patient Measurements: Height: 5\' 11"  (180.3 cm) Weight: 153 lb 7 oz (69.6 kg) IBW/kg (Calculated) : 75.3 Heparin Dosing Weight: 71.8 kg  Vital Signs: Temp: 99.1 F (37.3 C) (01/24 0448) Temp Source: Oral (01/24 0448) BP: 122/74 (01/24 0448) Pulse Rate: 99 (01/24 0448)  Labs: Recent Labs    08/30/19 0551 08/30/19 1816 08/31/19 0615 08/31/19 1624 09/01/19 0521  HGB 8.8*  --  8.6*  --  8.6*  HCT 27.8*  --  27.8*  --  27.2*  PLT 235  --  260  --  257  LABPROT 16.5*  --  19.9*  --   --   INR 1.3*  --  1.7*  --   --   HEPARINUNFRC 0.24*   < > 0.24* 0.55 0.40  CREATININE 5.83*  --  7.63*  --  5.43*   < > = values in this interval not displayed.    Estimated Creatinine Clearance: 13.2 mL/min (A) (by C-G formula based on SCr of 5.43 mg/dL (H)).  Assessment:  Patient is a 69 year old male with history of renal transplant in 05/2014 and now with ESRD on HD TTS. Pt had been on apixaban for left ventricular thombus, which has been switched to warfarin. Patient was placed on heparin bridge so that the [facility] can be removed. However, patient's AVF was not able to be cannulated on 1/14; therefore, plans have changed to keep the [facility] in for now. Pharmacy has been consulted to resume warfarin dosing and dose heparin. Of note, patient has also had a recent stroke.  Yesterday heparin level of 0.55 was above this patient's specific goal of 0.3 to 0.5 due to recent stroke. Heparin was intended to be reduced to 950 units/hr but heparin rate was not reduced. Heparin level of 0.40 this morning is therapeutic on heparin 1000 units/hr. Confirmed with RN that heparin is infusing at 1000 units/hr. Per RN no issues with infusion or IV access and no reported bleeding. Hgb and plt are low but stable.   INR today is 1.9 is subtherapeutic but close to goal of 2-3. All doses of  warfarin ordered have been administered.     Goal of Therapy:  Heparin level 0.3-0.5 units/ml due to recent CVA Monitor platelets by anticoagulation protocol: Yes   Plan:  Continue heparin to 1000 units/hr since patient was within goal this morning  Check confirmatory heparin level at 1300  Warfarin 4mg  x1 today  Monitor daily heparin level, INR, CBC Monitor for signs/symptoms of bleeding  Thank you for allowing pharmacy to be a part of this patient's care.  Cristela Felt, PharmD PGY1 Pharmacy Resident [employer]: [phone]  09/01/2019 7:02 AM

## 2019-09-01 NOTE — Progress Notes (Signed)
[facility] Progress Note   Dialysis Orders: TTSG/O -[facility] when COVID 19 negative 4 hr 15 min 180NRE 400/800 80.5 kg 2.0 K/ 2.0 Ca R AVF/RIJ TDC -Heparin 8000 units IV TIW -Mircera 75 mcg IV q 2 weeks (last dose 07/25/19) -Venofer 50 mg IV weekly -Parsabiv 7.5 mg IV TIW  Assessment/ Plan:   1.Acute COVID-19 pneumonia - s/p remdesivir &Decadron. Per primary. Now out of 21 day isolation window but still required to have 2 negative COVID 19 test before he can return to [facility]. Repeat test on 1/14 +. Pt can dialyze at [facility] until COVID tests negative.  2.ESRD- on HD TTS while COVID +.   HD Sat with net UF 1.5L through the fistula. Plan for HD today. Has been using AVF but without consistency.  If successful on Tuesday will then ask VIR to remove the  [facility]  3. Anemiaof CKD-Hgbimproving, now 8.6.No acute blood loss.IncreasedAranesp to 186mcg qwk. Transfuse prn.tsat 34%. 4. Secondary hyperparathyroidism- Phos improved to3.1, CCa 9.2. No binders or VDRA. No parsabiv because not on formulary.  5.HTN/volume- Blood pressure well controlled. Does not appear volume overloaded, titrate down volume as tolerated. Well below EDW, will need to lower at d/c. Post wt 01/21 67.8 kg. 6. Nutrition- Albumin low. Dys 3 diet. Continue renal vit/prostat.  7.LV thrombus-  Heparin bridging to coumadin. Per primary 8.Acute CVA- etiology unclear. Per primary  Disposition: SNF placement when bed available/INR therapeutic.  Subjective:   Denies f/c/n/v/dyspnea. No events overnight. Pleasant this am.   Objective:   BP 122/74 (BP Location: Left Arm)   Pulse 99   Temp 99.1 F (37.3 C) (Oral)   Resp 16   Ht 5\' 11"  (1.803 m)   Wt 69.6 kg   SpO2 94%   BMI 21.40 kg/m   Intake/Output Summary (Last 24 hours) at 09/01/2019 0758 Last data filed at 09/01/2019 0600 Gross per 24 hour  Intake 888.83 ml  Output 1650 ml  Net -761.17 ml   Weight change:   Physical  Exam: General:Chronically ill irritable appearing male in NAD Heart:S1,S2 RRR Lungs:CTAB No WOB Abdomen: S, NT Extremities: No LE edema Dialysis Access:RIJ TDC visible,  Drsg CDI. R AVF  Imaging: No results found.  Labs: BMET Recent Labs  Lab 08/26/19 0431 08/27/19 0000 08/28/19 0242 08/29/19 0700 08/30/19 0551 08/31/19 0615 09/01/19 0521  NA 133* 133* 134* 137 133* 137 136  K 4.6 5.1 5.2* 4.6 4.3 4.8 4.7  CL 96* 96* 100 99 97* 102 101  CO2 28 25 25 25 25 24 25   GLUCOSE 189* 191* 195* 111* 171* 185* 191*  BUN 63* 82* 50* 58* 34* 44* 29*  CREATININE 7.76* 8.75* 6.10* 8.05* 5.83* 7.63* 5.43*  CALCIUM 8.3* 8.2* 7.8* 8.5* 8.1* 8.4* 8.1*  PHOS 3.3 3.9 2.5 3.4 2.9 3.1 2.7   CBC Recent Labs  Lab 08/29/19 0700 08/30/19 0551 08/31/19 0615 09/01/19 0521  WBC 8.8 9.0 8.3 8.6  HGB 8.9* 8.8* 8.6* 8.6*  HCT 27.4* 27.8* 27.8* 27.2*  MCV 107.5* 106.5* 108.2* 107.9*  PLT 280 235 260 257    Medications:    .  stroke: mapping our early stages of recovery book   Does not apply Once  . allopurinol  100 mg Oral Daily  . aspirin  81 mg Oral Daily  . atorvastatin  40 mg Oral Daily  . Chlorhexidine Gluconate Cloth  6 each Topical Q0600  . darbepoetin (ARANESP) injection - DIALYSIS  150 mcg Intravenous Q Tue-HD  .  feeding supplement (ENSURE ENLIVE)  237 mL Oral TID BM  . feeding supplement (PRO-STAT SUGAR FREE 64)  30 mL Oral BID  . metoCLOPramide  5 mg Oral BID WC  . metoprolol succinate  25 mg Oral Daily  . multivitamin  1 tablet Oral QHS  . pantoprazole  40 mg Oral Daily  . predniSONE  5 mg Oral Q breakfast  . sodium chloride flush  3 mL Intravenous Q12H  . warfarin  4 mg Oral ONCE-1800  . Warfarin - Pharmacist Dosing Inpatient   Does not apply q1800      Otelia Santee, MD 09/01/2019, 7:58 AM

## 2019-09-02 LAB — RENAL FUNCTION PANEL
Albumin: 2.2 g/dL — ABNORMAL LOW (ref 3.5–5.0)
Anion gap: 12 (ref 5–15)
BUN: 44 mg/dL — ABNORMAL HIGH (ref 8–23)
CO2: 23 mmol/L (ref 22–32)
Calcium: 8.2 mg/dL — ABNORMAL LOW (ref 8.9–10.3)
Chloride: 98 mmol/L (ref 98–111)
Creatinine, Ser: 7.23 mg/dL — ABNORMAL HIGH (ref 0.61–1.24)
GFR calc Af Amer: 8 mL/min — ABNORMAL LOW (ref >60)
GFR calc non Af Amer: 7 mL/min — ABNORMAL LOW (ref >60)
Glucose, Bld: 207 mg/dL — ABNORMAL HIGH (ref 70–99)
Phosphorus: 2.5 mg/dL (ref 2.5–4.6)
Potassium: 4.8 mmol/L (ref 3.5–5.1)
Sodium: 133 mmol/L — ABNORMAL LOW (ref 135–145)

## 2019-09-02 LAB — PROTIME-INR
INR: 1.9 — ABNORMAL HIGH (ref 0.8–1.2)
Prothrombin Time: 22.1 seconds — ABNORMAL HIGH (ref 11.4–15.2)

## 2019-09-02 LAB — CBC
HCT: 27.2 % — ABNORMAL LOW (ref 39.0–52.0)
Hemoglobin: 8.5 g/dL — ABNORMAL LOW (ref 13.0–17.0)
MCH: 33.5 pg (ref 26.0–34.0)
MCHC: 31.3 g/dL (ref 30.0–36.0)
MCV: 107.1 fL — ABNORMAL HIGH (ref 80.0–100.0)
Platelets: 273 10*3/uL (ref 150–400)
RBC: 2.54 MIL/uL — ABNORMAL LOW (ref 4.22–5.81)
RDW: 18.3 % — ABNORMAL HIGH (ref 11.5–15.5)
WBC: 10.6 10*3/uL — ABNORMAL HIGH (ref 4.0–10.5)
nRBC: 0.4 % — ABNORMAL HIGH (ref 0.0–0.2)

## 2019-09-02 LAB — GLUCOSE, CAPILLARY
Glucose-Capillary: 254 mg/dL — ABNORMAL HIGH (ref 70–99)
Glucose-Capillary: 184 mg/dL — ABNORMAL HIGH (ref 70–99)
Glucose-Capillary: 210 mg/dL — ABNORMAL HIGH (ref 70–99)
Glucose-Capillary: 260 mg/dL — ABNORMAL HIGH (ref 70–99)
Glucose-Capillary: 311 mg/dL — ABNORMAL HIGH (ref 70–99)

## 2019-09-02 LAB — HEPARIN LEVEL (UNFRACTIONATED): Heparin Unfractionated: 0.39 IU/mL (ref 0.30–0.70)

## 2019-09-02 LAB — MAGNESIUM: Magnesium: 1.7 mg/dL (ref 1.7–2.4)

## 2019-09-02 MED ORDER — CHLORHEXIDINE GLUCONATE CLOTH 2 % EX PADS
6.0000 | MEDICATED_PAD | Freq: Every day | CUTANEOUS | Status: DC
  Administered 2019-09-02 – 2019-09-09 (×3): 6 via TOPICAL

## 2019-09-02 MED ORDER — INSULIN ASPART 100 UNIT/ML ~~LOC~~ SOLN
2.0000 [IU] | Freq: Three times a day (TID) | SUBCUTANEOUS | Status: DC
  Administered 2019-09-02 – 2019-09-13 (×23): 2 [IU] via SUBCUTANEOUS

## 2019-09-02 NOTE — Progress Notes (Signed)
Marland Kitchen  PROGRESS NOTE    Jonathan Dippold Sr.  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 Pharm dosing appreciate assistance. TDC to be removed?? Appreciate nephro assistance. Working on SNF options.    Assessment & Plan:   Principal Problem:   Acute respiratory failure due to COVID-19 [facility]) Active Problems:   Chronic hepatitis C without hepatic coma ([name])   Diabetes mellitus type 2, uncomplicated ([place])   History of coronary artery disease   Hypertension   ESRD (end stage renal disease) on dialysis ([name])   Acute respiratory failure with hypoxia ([name])   Cerebral thrombosis with cerebral infarction   Cerebral embolism with cerebral infarction  COVID-19 pneumonia - Resolved - testing is still positive and this is precluding outpatient HD center placement; working on options - needs SNF; this is the hanging point d/t COVID history in combination with HD needs     - no acute events ON.   Acute strokes left pons left occipital - Etiology unclear but though to be secondary to Covid versus LV thrombus - continue ASA, lipitor, coumadin (INR is still not therapeutic) - need skilled facility placement  ESRD/HD Hypophosphatemia - Prior was on MWF; further management per them - per neprho note, may be able to dialysis  at [facility]     - [facility] to be removed?  LV thrombus - coumadin; bridging with Heparin til INR 2.0 - INR remains 1.9 this AM. Pharmacy dosing  Prior cadaveric renal transplant - continue prednisone 5  Hyperglycemia Pre-diabetes     - add SSI  DVT prophylaxis: heparin/coumadin Code Status: FULL Family Communication: None at bedside   Disposition Plan: Needs therapeutic INR and SNF availability  Consultants:   Nephrology  ROS:  Denies dyspnea, HA, N, V, ab pain, CP . Remainder 10-pt ROS is negative for all not previously mentioned.  Subjective: "Ok. Ok."  Objective: Vitals:   09/01/19 1711 09/01/19 2128 09/02/19 0540 09/02/19 0842  BP: (!) 145/78 139/75 (!) 151/75 (!) 146/74  Pulse: 90 97 87 89  Resp: 18 17 16 18   Temp: 98.6 F (37 C) 98.6 F (37 C) 99.1 F (37.3 C) 99 F (37.2 C)  TempSrc: Oral Oral Oral Oral  SpO2: 97% 100% 95% 96%  Weight:  69.9 kg    Height:        Intake/Output Summary (Last 24 hours) at 09/02/2019 1125 Last data filed at 09/02/2019 0900 Gross per 24 hour  Intake 830.77 ml  Output 125 ml  Net 705.77 ml   Filed Weights   08/31/19 2103 09/01/19 0500 09/01/19 2128  Weight: 69.6 kg 69.6 kg 69.9 kg    Examination:  General: 69 y.o. male resting in bed in NAD Cardiovascular: RRR, +S1, S2, no m/g/r, equal pulses throughout Respiratory: CTABL, no w/r/r GI: BS+, NDNT, soft MSK: No e/c/c Neuro: alert to name, follows commands Psyc: calm/cooperative   Data Reviewed: I have personally reviewed  following labs and imaging studies.  CBC: Recent Labs  Lab 08/29/19 0700 08/30/19 0551 08/31/19 0615 09/01/19 0521 09/02/19 0625  WBC 8.8 9.0 8.3 8.6 10.6*  HGB 8.9* 8.8* 8.6* 8.6* 8.5*  HCT 27.4* 27.8* 27.8* 27.2* 27.2*  MCV 107.5* 106.5* 108.2* 107.9* 107.1*  PLT 280 235 260 257 [id number]   Basic Metabolic Panel: Recent Labs  Lab 08/29/19 0700 08/30/19 0551 08/31/19 0615 09/01/19 0521 09/02/19 0625  NA 137 133* 137 136  133*  K 4.6 4.3 4.8 4.7 4.8  CL 99 97* 102 101 98  CO2 25 25 24 25 23   GLUCOSE 111* 171* 185* 191* 207*  BUN 58* 34* 44* 29* 44*  CREATININE 8.05* 5.83* 7.63* 5.43* 7.23*  CALCIUM 8.5* 8.1* 8.4* 8.1* 8.2*  MG  --  1.9  --  1.8 1.7  PHOS 3.4 2.9 3.1 2.7 2.5   GFR: Estimated Creatinine Clearance: 9.9 mL/min (A) (by C-G formula based on SCr of 7.23 mg/dL (H)). Liver Function Tests: Recent Labs  Lab 08/29/19 0700 08/30/19 0551 08/31/19 0615 09/01/19 0521 09/02/19 0625  ALBUMIN 2.4* 2.4* 2.4* 2.3* 2.2*   No results for input(s): LIPASE, AMYLASE in the last 168 hours. No results for input(s): AMMONIA in the last 168 hours. Coagulation Profile: Recent Labs  Lab 08/29/19 0700 08/30/19 0551 08/31/19 0615 09/01/19 0521 09/02/19 0625  INR 1.2 1.3* 1.7* 1.9* 1.9*   Cardiac Enzymes: No results for input(s): CKTOTAL, CKMB, CKMBINDEX, TROPONINI in the last 168 hours. BNP (last 3 results) No results for input(s): PROBNP in the last 8760 hours. HbA1C: No results for input(s): HGBA1C in the last 72 hours. CBG: Recent Labs  Lab 09/01/19 0648 09/01/19 1142 09/01/19 1710 09/01/19 2128 09/02/19 0732  GLUCAP 165* 239* 355* 296* 184*   Lipid Profile: No results for input(s): CHOL, HDL, LDLCALC, TRIG, CHOLHDL, LDLDIRECT in the last 72 hours. Thyroid Function Tests: No results for input(s): TSH, T4TOTAL, FREET4, T3FREE, THYROIDAB in the last 72 hours. Anemia Panel: No results for input(s): VITAMINB12, FOLATE, FERRITIN, TIBC, IRON, RETICCTPCT in the last 72 hours. Sepsis Labs: No results for input(s): PROCALCITON, LATICACIDVEN in the last 168 hours.  No results found for this or any previous visit (from the past 240 hour(s)).    Radiology Studies: No results found.   Scheduled Meds: .  stroke: mapping our early stages of recovery book   Does not apply Once  . allopurinol  100 mg Oral Daily  . aspirin  81 mg Oral Daily  . atorvastatin  40 mg Oral Daily  . Chlorhexidine  Gluconate Cloth  6 each Topical Q0600  . Chlorhexidine Gluconate Cloth  6 each Topical Q0600  . darbepoetin (ARANESP) injection - DIALYSIS  150 mcg Intravenous Q Tue-HD  . feeding supplement (ENSURE ENLIVE)  237 mL Oral TID BM  . feeding supplement (PRO-STAT SUGAR FREE 64)  30 mL Oral BID  . insulin aspart  0-6 Units Subcutaneous TID WC  . insulin aspart  2 Units Subcutaneous TID WC  . metoCLOPramide  5 mg Oral BID WC  . metoprolol succinate  25 mg Oral Daily  . multivitamin  1 tablet Oral QHS  . pantoprazole  40 mg Oral Daily  . predniSONE  5 mg Oral Q breakfast  . sodium chloride flush  3 mL Intravenous Q12H  . Warfarin - Pharmacist Dosing Inpatient   Does not apply q1800   Continuous Infusions: . heparin 900 Units/hr (09/02/19 0210)     LOS: 34 days  Time spent: 15 minutes spent in the coordination of care today.    Jonnie Finner, DO [facility]  If 7PM-7AM, please contact night-coverage [email or web address] 09/02/2019, 11:25 AM

## 2019-09-02 NOTE — Progress Notes (Signed)
[facility] Progress Note   Subjective:   Patient seen and examined at bedside.  Reports he is feeling well. Dialysis is going good.  No specific complaints.  Denies SOB, CP, n/v/d, abdominal pain, weakness and fatigue.   Objective Vitals:   09/01/19 1711 09/01/19 2128 09/02/19 0540 09/02/19 0842  BP: (!) 145/78 139/75 (!) 151/75 (!) 146/74  Pulse: 90 97 87 89  Resp: 18 17 16 18   Temp: 98.6 F (37 C) 98.6 F (37 C) 99.1 F (37.3 C) 99 F (37.2 C)  TempSrc: Oral Oral Oral Oral  SpO2: 97% 100% 95% 96%  Weight:  69.9 kg    Height:       Physical Exam General:NAD, chronically ill appearing male Heart:RRR Lungs:CTAB, nml WOB Abdomen:soft, NTND Extremities:no LE edema Dialysis Access: R IJ TDC, RU AVF +b   Filed Weights   08/31/19 2103 09/01/19 0500 09/01/19 2128  Weight: 69.6 kg 69.6 kg 69.9 kg    Intake/Output Summary (Last 24 hours) at 09/02/2019 1031 Last data filed at 09/02/2019 0900 Gross per 24 hour  Intake 830.77 ml  Output 125 ml  Net 705.77 ml    Additional Objective Labs: Basic Metabolic Panel: Recent Labs  Lab 08/31/19 0615 09/01/19 0521 09/02/19 0625  NA 137 136 133*  K 4.8 4.7 4.8  CL 102 101 98  CO2 24 25 23   GLUCOSE 185* 191* 207*  BUN 44* 29* 44*  CREATININE 7.63* 5.43* 7.23*  CALCIUM 8.4* 8.1* 8.2*  PHOS 3.1 2.7 2.5   Liver Function Tests: Recent Labs  Lab 08/31/19 0615 09/01/19 0521 09/02/19 0625  ALBUMIN 2.4* 2.3* 2.2*   CBC: Recent Labs  Lab 08/29/19 0700 08/29/19 0700 08/30/19 0551 08/30/19 0551 08/31/19 0615 09/01/19 0521 09/02/19 0625  WBC 8.8   < > 9.0   < > 8.3 8.6 10.6*  HGB 8.9*   < > 8.8*   < > 8.6* 8.6* 8.5*  HCT 27.4*   < > 27.8*   < > 27.8* 27.2* 27.2*  MCV 107.5*  --  106.5*  --  108.2* 107.9* 107.1*  PLT 280   < > 235   < > 260 257 273   < > = values in this interval not displayed.   Blood Culture    Component Value Date/Time   SDES BLOOD SITE NOT SPECIFIED 07/30/2019 1843   SPECREQUEST   07/30/2019 1843    BOTTLES DRAWN AEROBIC AND ANAEROBIC Blood Culture results may not be optimal due to an inadequate volume of blood received in culture bottles   CULT  07/30/2019 1843    NO GROWTH 5 DAYS Performed at [facility], [place] [address], [place], [place] [address]    REPTSTATUS 08/04/2019 FINAL 07/30/2019 1843   CBG: Recent Labs  Lab 09/01/19 0648 09/01/19 1142 09/01/19 1710 09/01/19 2128 09/02/19 0732  GLUCAP 165* 239* 355* 296* 184*    Medications: . heparin 900 Units/hr (09/02/19 0210)   .  stroke: mapping our early stages of recovery book   Does not apply Once  . allopurinol  100 mg Oral Daily  . aspirin  81 mg Oral Daily  . atorvastatin  40 mg Oral Daily  . Chlorhexidine Gluconate Cloth  6 each Topical Q0600  . darbepoetin (ARANESP) injection - DIALYSIS  150 mcg Intravenous Q Tue-HD  . feeding supplement (ENSURE ENLIVE)  237 mL Oral TID BM  . feeding supplement (PRO-STAT SUGAR FREE 64)  30 mL Oral BID  . insulin aspart  0-6 Units Subcutaneous TID WC  . metoCLOPramide  5 mg Oral BID WC  . metoprolol succinate  25 mg Oral Daily  . multivitamin  1 tablet Oral QHS  . pantoprazole  40 mg Oral Daily  . predniSONE  5 mg Oral Q breakfast  . sodium chloride flush  3 mL Intravenous Q12H  . Warfarin - Pharmacist Dosing Inpatient   Does not apply q1800    Dialysis Orders: TTSG/O -[facility] when COVID 19 negative 4 hr 15 min 180NRE 400/800 80.5 kg 2.0 K/ 2.0 Ca R AVF/RIJ TDC -Heparin 8000 units IV TIW -Mircera 75 mcg IV q 2 weeks (last dose 07/25/19) -Venofer 50 mg IV weekly -Parsabiv 7.5 mg IV TIW  Assessment/Plan: 1.Acute COVID-19 pneumonia - s/p remdesivir &Decadron. Per primary. Now out of 21 day isolation window but still required to have 2 negative COVID 19 test before he can return to [facility]. Repeat test on 1/14 +. Pt can dialyze at [facility] until COVID tests negative.  2.ESRD- on HD TTS while COVID +. AVF has been used, but not consistently.   Plan for HD tomorrow using AVF, and will ask IR to remove TDC if no issues using AVF. K 4.8.  3. Anemiaof CKD-Hgbimproving, now 8.5.No acute blood loss.IncreasedAranesp to 162mcg qwk. Transfuse prn.tsat 34%. 4. Secondary hyperparathyroidism- Phos 2.5, CCa 9.6. No binders or VDRA. No parsabiv because not on formulary.  5.HTN/volume- Blood pressure well controlled. Does not appear volume overloaded, titrate down volume as tolerated. Well below EDW, will need to lower at d/c. Post wt 01/21 67.8 kg. 6. Nutrition- Albumin low. Dys 3 diet. Continue renal vit/prostat.  7.LV thrombus-Heparin bridging tocoumadin. Per primary 8.Acute CVA- etiology unclear. Per primary  Disposition: SNF placement when bed available/INR therapeutic.  Jen Mow, PA-C [facility] Pager: [phone] 09/02/2019,10:31 AM  LOS: 34 days

## 2019-09-02 NOTE — Progress Notes (Signed)
Inpatient Diabetes Program Recommendations  AACE/ADA: New Consensus Statement on Inpatient Glycemic Control (2015)  Target Ranges:  Prepandial:   less than 140 mg/dL      Peak postprandial:   less than 180 mg/dL (1-2 hours)      Critically ill patients:  140 - 180 mg/dL   Lab Results  Component Value Date   GLUCAP 184 (H) 09/02/2019   HGBA1C 6.3 (H) 07/31/2019    Review of Glycemic Control Results for MICAN, COBY SR. (MRN [id number]) as of 09/02/2019 10:12  Ref. Range 09/01/2019 11:42 09/01/2019 17:10 09/01/2019 21:28 09/02/2019 07:32  Glucose-Capillary Latest Ref Range: 70 - 99 mg/dL 239 (H) 355 (H) 296 (H) 184 (H)   Diabetes history: Type 2 DM Outpatient Diabetes medications: Tresiba 12 units QD, Novolog 0-10 units BID Current orders for Inpatient glycemic control: Novolog 0-6 units TID Prednisone 5 mg QAM  Inpatient Diabetes Program Recommendations:   While in the setting of steroids, consider adding Novolog 2 units TID (Assuming patient consumes >50% of meal).   Thanks, Bronson Curb, MSN, RNC-OB Diabetes Coordinator [phone] (8a-5p)

## 2019-09-02 NOTE — Progress Notes (Signed)
SLP Cancellation Note  Patient Details Name: Jonathan Horry Sr. MRN: [id number] DOB: [birth year]   Cancelled treatment:       Reason Eval/Treat Not Completed: Patient declined, no reason specified  ST attempted tx. Patient declined ST tx at this time, did not specify a reason. Will re-attempt as schedule allows.  Marina Goodell, M.Ed., CCC-SLP Speech Therapy Acute Rehabilitation [phone]: Acute Rehab office [phone] - pager   Marina Goodell 09/02/2019, 2:10 PM

## 2019-09-02 NOTE — Plan of Care (Signed)
  Problem: Nutrition: Goal: Adequate nutrition will be maintained Outcome: Progressing   

## 2019-09-02 NOTE — Progress Notes (Signed)
ANTICOAGULATION CONSULT NOTE - Follow Up Consult  Pharmacy Consult for Heparin Indication: h/o LV thrombus  No Known Allergies  Patient Measurements: Height: 5\' 11"  (180.3 cm) Weight: 154 lb 1.6 oz (69.9 kg) IBW/kg (Calculated) : 75.3 Heparin Dosing Weight: 71.8 kg  Vital Signs: Temp: 99.1 F (37.3 C) (01/25 0540) Temp Source: Oral (01/25 0540) BP: 151/75 (01/25 0540) Pulse Rate: 87 (01/25 0540)  Labs: Recent Labs    08/31/19 0615 08/31/19 1624 09/01/19 0521 09/01/19 0521 09/01/19 1237 09/01/19 2214 09/02/19 0625  HGB 8.6*  --  8.6*  --   --   --  8.5*  HCT 27.8*  --  27.2*  --   --   --  27.2*  PLT 260  --  257  --   --   --  273  LABPROT 19.9*  --  21.6*  --   --   --  22.1*  INR 1.7*  --  1.9*  --   --   --  1.9*  HEPARINUNFRC 0.24*   < > 0.40   < > 0.63 0.59 0.39  CREATININE 7.63*  --  5.43*  --   --   --  7.23*   < > = values in this interval not displayed.    Estimated Creatinine Clearance: 9.9 mL/min (A) (by C-G formula based on SCr of 7.23 mg/dL (H)).  Assessment:  Patient is a 69 year old male with history of renal transplant in 05/2014 and now with ESRD on HD TTS. Pt had been on apixaban for left ventricular thombus, which has been switched to warfarin. Patient was placed on heparin bridge so that the [facility] can be removed. However, patient's AVF was not able to be cannulated on 1/14; therefore, plans have changed to keep the [facility] in for now. Pharmacy has been consulted to resume warfarin dosing and dose heparin. Of note, patient has also had a recent stroke.  Heparin level at goal INR still 1.9 today   Goal of Therapy:  Heparin level 0.3-0.5 units/ml due to recent CVA Monitor platelets by anticoagulation protocol: Yes  INR 2 to 3   Plan:  Continue heparin at 900 units / hr Warfarin 5 mg po x 1 today AM labs  Thank you Anette Guarneri, PharmD [phone]   09/02/2019       7:54 AM  Please check [email or web address] for unit-specific pharmacist phone  numbers

## 2019-09-02 NOTE — Progress Notes (Signed)
Physical Therapy Treatment Patient Details Name: Jonathan Jaquith Sr. MRN: [id number] DOB: [birth year] Today's Date: 09/02/2019    History of Present Illness 69 y.o. 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 Diagnosed with COVID 6 days prior to coming to ED, where chest x-ray showed multifocal pneumonia. Admitted 07/30/19 for treatment of acute encephalopathy, difficulty swallowing and pneumonia.MRI of the brain showed small acute infarcts in the left pons, left occipital lob and right frontal lobe in addition to chronic infarcts     PT Comments    Pt was seen for mobility to get up to side of bed and to go to chair with PT and CNA assisting him.  He is slow to respond to cues, seems to quickly forget the plan and with both tactile and verbal cues can finally complete a task.  Trip to chair was 2 person for safety on walker, with pt requiring specific short direct cues to sequence and set up to sit.  Does not recall hand placement on any trial and is sporadically performing it correctly even when cued.  Follow acutely to work on standing endurance and trip to walk when ready.  Will increase frequency when pt demonstrates more willingness to be involved in therapy.   Follow Up Recommendations  SNF     Equipment Recommendations  None recommended by PT    Recommendations for Other Services       Precautions / Restrictions Precautions Precautions: Fall Restrictions Weight Bearing Restrictions: No    Mobility  Bed Mobility Overal bed mobility: Needs Assistance Bed Mobility: Supine to Sit     Supine to sit: Min assist     General bed mobility comments: min assist to support trunk to sit on side of bed  Transfers Overall transfer level: Needs assistance Equipment used: Rolling walker (2 wheeled);2  person hand held assist Transfers: Sit to/from Stand Sit to Stand: Mod assist;+2 physical assistance;+2 safety/equipment;From elevated surface         General transfer comment: mod to power up then min of two to get to the chair with repetitive instructions  Ambulation/Gait             General Gait Details: steps for transfer only   Stairs             Wheelchair Mobility    Modified Rankin (Stroke Patients Only)       Balance Overall balance assessment: Needs assistance Sitting-balance support: Feet supported Sitting balance-Leahy Scale: Fair     Standing balance support: Bilateral upper extremity supported;During functional activity Standing balance-Leahy Scale: Poor                              Cognition Arousal/Alertness: Awake/alert Behavior During Therapy: Flat affect Overall Cognitive Status: Impaired/Different from baseline Area of Impairment: Problem solving;Awareness;Safety/judgement;Following commands;Memory;Attention;Orientation                 Orientation Level: Time;Situation Current Attention Level: Selective Memory: Decreased recall of precautions;Decreased short-term memory Following Commands: Follows one step commands inconsistently;Follows one step commands with increased time Safety/Judgement: Decreased awareness of safety;Decreased awareness of deficits Awareness: Intellectual Problem Solving: Slow processing;Requires verbal cues General Comments: redirection mult times to get him to stand up and get to side of bed      Exercises  General Comments General comments (skin integrity, edema, etc.): pt is willing to try to stand at walker but is cued repetitively, then took steps with continual directions for turning, use of walker and safety to back to recliner       Pertinent Vitals/Pain Pain Assessment: No/denies pain    Home Living                      Prior Function            PT Goals  (current goals can now be found in the care plan section) Acute Rehab PT Goals Patient Stated Goal: none stated Progress towards PT goals: Progressing toward goals    Frequency    Min 1X/week      PT Plan Current plan remains appropriate    Co-evaluation              AM-PAC PT "6 Clicks" Mobility   Outcome Measure  Help needed turning from your back to your side while in a flat bed without using bedrails?: A Little Help needed moving from lying on your back to sitting on the side of a flat bed without using bedrails?: A Little Help needed moving to and from a bed to a chair (including a wheelchair)?: A Little Help needed standing up from a chair using your arms (e.g., wheelchair or bedside chair)?: A Lot Help needed to walk in hospital room?: A Lot Help needed climbing 3-5 steps with a railing? : Total 6 Click Score: 14    End of Session Equipment Utilized During Treatment: Oxygen Activity Tolerance: Patient limited by fatigue Patient left: in chair;with call bell/phone within reach;with chair alarm set;with nursing/sitter in room Nurse Communication: Mobility status PT Visit Diagnosis: Unsteadiness on feet (R26.81);Other abnormalities of gait and mobility (R26.89);Muscle weakness (generalized) (M62.81);Ataxic gait (R26.0);Difficulty in walking, not elsewhere classified (R26.2)     Time: [id number] PT Time Calculation (min) (ACUTE ONLY): 14 min  Charges:  $Therapeutic Activity: 8-22 mins                    Ramond Dial 09/02/2019, 1:25 PM   Mee Hives, PT MS Acute Rehab Dept. Number: [place]

## 2019-09-03 ENCOUNTER — Inpatient Hospital Stay (HOSPITAL_COMMUNITY): Payer: Medicare Other

## 2019-09-03 HISTORY — PX: IR REMOVAL TUN CV CATH W/O FL: IMG2289

## 2019-09-03 LAB — CBC
HCT: 29.4 % — ABNORMAL LOW (ref 39.0–52.0)
Hemoglobin: 9.3 g/dL — ABNORMAL LOW (ref 13.0–17.0)
MCH: 33.6 pg (ref 26.0–34.0)
MCHC: 31.6 g/dL (ref 30.0–36.0)
MCV: 106.1 fL — ABNORMAL HIGH (ref 80.0–100.0)
Platelets: 266 10*3/uL (ref 150–400)
RBC: 2.77 MIL/uL — ABNORMAL LOW (ref 4.22–5.81)
RDW: 17.9 % — ABNORMAL HIGH (ref 11.5–15.5)
WBC: 10.1 10*3/uL (ref 4.0–10.5)
nRBC: 0 % (ref 0.0–0.2)

## 2019-09-03 LAB — RENAL FUNCTION PANEL
Albumin: 2.3 g/dL — ABNORMAL LOW (ref 3.5–5.0)
Anion gap: 9 (ref 5–15)
BUN: 52 mg/dL — ABNORMAL HIGH (ref 8–23)
CO2: 23 mmol/L (ref 22–32)
Calcium: 8.6 mg/dL — ABNORMAL LOW (ref 8.9–10.3)
Chloride: 102 mmol/L (ref 98–111)
Creatinine, Ser: 8.46 mg/dL — ABNORMAL HIGH (ref 0.61–1.24)
GFR calc Af Amer: 7 mL/min — ABNORMAL LOW (ref >60)
GFR calc non Af Amer: 6 mL/min — ABNORMAL LOW (ref >60)
Glucose, Bld: 155 mg/dL — ABNORMAL HIGH (ref 70–99)
Phosphorus: 3.1 mg/dL (ref 2.5–4.6)
Potassium: 5.6 mmol/L — ABNORMAL HIGH (ref 3.5–5.1)
Sodium: 134 mmol/L — ABNORMAL LOW (ref 135–145)

## 2019-09-03 LAB — GLUCOSE, CAPILLARY
Glucose-Capillary: 128 mg/dL — ABNORMAL HIGH (ref 70–99)
Glucose-Capillary: 140 mg/dL — ABNORMAL HIGH (ref 70–99)
Glucose-Capillary: 146 mg/dL — ABNORMAL HIGH (ref 70–99)
Glucose-Capillary: 231 mg/dL — ABNORMAL HIGH (ref 70–99)

## 2019-09-03 LAB — PROTIME-INR
INR: 1.9 — ABNORMAL HIGH (ref 0.8–1.2)
Prothrombin Time: 21.4 seconds — ABNORMAL HIGH (ref 11.4–15.2)

## 2019-09-03 LAB — HEPARIN LEVEL (UNFRACTIONATED): Heparin Unfractionated: 0.42 IU/mL (ref 0.30–0.70)

## 2019-09-03 LAB — MAGNESIUM: Magnesium: 2.1 mg/dL (ref 1.7–2.4)

## 2019-09-03 MED ORDER — CHLORHEXIDINE GLUCONATE 4 % EX LIQD
CUTANEOUS | Status: DC | PRN
  Administered 2019-09-03: 1 via TOPICAL

## 2019-09-03 MED ORDER — DARBEPOETIN ALFA 150 MCG/0.3ML IJ SOSY
PREFILLED_SYRINGE | INTRAMUSCULAR | Status: AC
  Administered 2019-09-03: 150 ug via INTRAVENOUS
  Filled 2019-09-03: qty 0.3

## 2019-09-03 MED ORDER — WARFARIN SODIUM 6 MG PO TABS
6.0000 mg | ORAL_TABLET | Freq: Once | ORAL | Status: AC
  Administered 2019-09-03: 6 mg via ORAL
  Filled 2019-09-03: qty 1

## 2019-09-03 MED ORDER — CHLORHEXIDINE GLUCONATE 4 % EX LIQD
CUTANEOUS | Status: AC
  Filled 2019-09-03: qty 15

## 2019-09-03 MED ORDER — LIDOCAINE HCL 1 % IJ SOLN
INTRAMUSCULAR | Status: AC
  Filled 2019-09-03: qty 20

## 2019-09-03 MED ORDER — LIDOCAINE HCL (PF) 1 % IJ SOLN
INTRAMUSCULAR | Status: DC | PRN
  Administered 2019-09-03: 10 mL

## 2019-09-03 NOTE — Procedures (Signed)
PROCEDURE SUMMARY:  Successful removal of tunneled right IJ HD catheter. No immediate complications.  EBL < 5 mL. Patient tolerated well.  Pressure dressing (gauze and Tegaderm) applied to site.  Please see imaging section of Epic for full dictation.   Earley Abide PA-C 09/03/2019 3:31 PM

## 2019-09-03 NOTE — Clinical Social Work Note (Signed)
Facility search continues for patient. Call made to [facility], admissions director at [facility] [phone]) at her request from 1/21, and message left regarding Mr. Swaringen.  Xian Alves Givens, MSW, LCSW Licensed Clinical Social Worker [place] [phone]

## 2019-09-03 NOTE — Progress Notes (Signed)
OT Cancellation Note  Patient Details Name: Jonathan Garringer Sr. MRN: [id number] DOB: [birth year]   Cancelled Treatment:    Reason Eval/Treat Not Completed: Patient at procedure or test/ unavailable(HD). OT will continue to follow acutely.  [name] 09/03/2019, 9:06 AM

## 2019-09-03 NOTE — Plan of Care (Signed)
  Problem: Safety: Goal: Ability to remain free from injury will improve Outcome: Progressing   

## 2019-09-03 NOTE — Progress Notes (Signed)
Jonathan [name]  PROGRESS NOTE    Jonathan Carrano Sr.  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 Pharm dosing coumadin. Appreciate assistance. He denies complaints. HD today. Appreciate nephro assistance. Still working on placement.     Assessment & Plan:   Principal Problem:   Acute respiratory failure due to COVID-19 [place]) Active Problems:   Chronic hepatitis C without hepatic coma ([name])   Diabetes mellitus type 2, uncomplicated ([place])   History of coronary artery disease   Hypertension   ESRD (end stage renal disease) on dialysis ([name])   Acute respiratory failure with hypoxia ([name])   Cerebral thrombosis with cerebral infarction   Cerebral embolism with cerebral infarction  COVID-19 pneumonia - Resolved - testing is still positive and this is precluding outpatient HD center placement; working on options - needs SNF; this is the hanging point d/t COVID history in combination with HD needs - no acute events ON.  Acute strokes left pons left occipital - Etiology unclear but though to be secondary to Covid versus LV thrombus - continue ASA, lipitor, coumadin (INR is still not therapeutic) - needs skilled facility placement  ESRD/HD Hypophosphatemia - Prior was on MWF; further management per them - per neprho note,  may be able to dialysis at [facility]     - [facility] to be removed?     - 09/03/19: HD today. Doing well.  LV thrombus - coumadin; bridging with Heparin til INR 2.0 - Awaiting labs. Pharmacy dosing coumadin  Prior cadaveric renal transplant - continue prednisone 5  Hyperglycemia Pre-diabetes - continue SSI and WM insulin  DVT prophylaxis: heaprin/coumadin Code Status: FULL Family Communication: None at bedside   Disposition Plan: Needs therapeutic INR and SNF availability  Consultants:   Nephrology  Subjective: "Alright, yeah."  Objective: Vitals:   09/03/19 0900 09/03/19 0930 09/03/19 0958 09/03/19 1044  BP: 115/67 104/62 (!) 112/58 132/86  Pulse: 94 94 (!) 101 (!) 104  Resp:   17 18  Temp:   97.8 F (36.6 C) 97.9 F (36.6 C)  TempSrc:   Oral Oral  SpO2:   97% 98%  Weight:   70.1 kg   Height:        Intake/Output Summary (Last 24 hours) at 09/03/2019 1330 Last data filed at 09/03/2019 [id number] Gross per 24 hour  Intake 420 ml  Output 2126 ml  Net -1706 ml   Filed Weights   09/03/19 0500 09/03/19 0650 09/03/19 0958  Weight: 73.6 kg 72.6 kg 70.1 kg    Examination:  General: 69 y.o. male resting in bed in NAD Cardiovascular: RRR, +S1, S2, no m/g/r Respiratory: CTABL, no w/r/r, normal WOB GI: BS+, NDNT, no masses noted, no organomegaly noted MSK: No e/c/c Neuro: Alert and following commands Psyc: calm/cooperative   Data Reviewed: I have personally reviewed following labs and imaging studies.  CBC: Recent Labs  Lab 08/30/19 0551 08/31/19 0615 09/01/19 0521 09/02/19 0625 09/03/19 0500  WBC 9.0 8.3 8.6 10.6* 10.1  HGB 8.8* 8.6* 8.6* 8.5* 9.3*  HCT 27.8* 27.8* 27.2* 27.2* 29.4*  MCV 106.5* 108.2* 107.9* 107.1* 106.1*  PLT 235 260 257 273 [id number]   Basic Metabolic Panel: Recent Labs  Lab 08/30/19 0551 08/31/19 0615 09/01/19 0521 09/02/19 0625 09/03/19 0500  NA 133* 137 136 133* 134*  K 4.3 4.8 4.7 4.8 5.6*  CL 97* 102 101 98 102   CO2 25 24 25 23 23   GLUCOSE 171* 185* 191* 207* 155*  BUN 34* 44* 29* 44* 52*  CREATININE 5.83* 7.63* 5.43* 7.23* 8.46*  CALCIUM 8.1* 8.4* 8.1* 8.2* 8.6*  MG 1.9  --  1.8 1.7 2.1  PHOS 2.9 3.1 2.7 2.5 3.1   GFR: Estimated Creatinine Clearance: 8.5 mL/min (A) (by C-G formula based on SCr of 8.46 mg/dL (H)). Liver Function Tests: Recent Labs  Lab 08/30/19 0551 08/31/19 0615 09/01/19 0521 09/02/19 0625 09/03/19 0500  ALBUMIN 2.4* 2.4* 2.3* 2.2* 2.3*   No results for input(s): LIPASE, AMYLASE in the last 168 hours. No results for input(s): AMMONIA in the last 168 hours. Coagulation Profile: Recent Labs  Lab 08/30/19 0551 08/31/19 0615 09/01/19 0521 09/02/19 0625 09/03/19 0500  INR 1.3* 1.7* 1.9* 1.9* 1.9*   Cardiac Enzymes: No results for input(s): CKTOTAL, CKMB, CKMBINDEX, TROPONINI in the last 168 hours. BNP (last 3 results) No results for input(s): PROBNP in the last 8760 hours. HbA1C: No results for input(s): HGBA1C in the last 72 hours. CBG: Recent Labs  Lab 09/02/19 1116 09/02/19 1612 09/02/19 2101 09/03/19 0631 09/03/19 1137  GLUCAP 210* 260* 311* 128* 140*   Lipid Profile: No results for input(s): CHOL, HDL, LDLCALC, TRIG, CHOLHDL, LDLDIRECT in the last 72 hours. Thyroid Function Tests: No results for input(s): TSH, T4TOTAL, FREET4, T3FREE, THYROIDAB in the last 72 hours. Anemia Panel: No results for input(s): VITAMINB12, FOLATE, FERRITIN, TIBC, IRON, RETICCTPCT in the last 72 hours. Sepsis Labs: No results for input(s): PROCALCITON, LATICACIDVEN in the last 168 hours.  No results found for this or any previous visit (from the past 240 hour(s)).    Radiology Studies: No results found.   Scheduled Meds: .  stroke: mapping our early stages of recovery book   Does not apply Once  . allopurinol  100 mg Oral Daily  . aspirin  81 mg Oral Daily  . atorvastatin  40 mg Oral Daily  . Chlorhexidine Gluconate Cloth  6 each Topical Q0600  . Chlorhexidine  Gluconate Cloth  6 each Topical Q0600  . darbepoetin (ARANESP) injection - DIALYSIS  150 mcg Intravenous Q Tue-HD  . feeding supplement (ENSURE ENLIVE)  237 mL Oral TID BM  . feeding supplement (PRO-STAT SUGAR FREE 64)  30 mL Oral BID  . insulin aspart  0-6 Units Subcutaneous TID WC  . insulin aspart  2 Units Subcutaneous TID WC  . metoCLOPramide  5 mg Oral BID WC  . metoprolol succinate  25 mg Oral Daily  . multivitamin  1 tablet Oral QHS  . pantoprazole  40 mg Oral Daily  . predniSONE  5 mg Oral Q breakfast  . sodium chloride flush  3 mL Intravenous Q12H  . warfarin  6 mg Oral ONCE-1800  . Warfarin - Pharmacist Dosing Inpatient   Does not apply q1800   Continuous Infusions: . heparin 900 Units/hr (09/03/19 0935)     LOS: 35 days    Time spent: 15 minutes spent in the coordination of care today.    Delan Ksiazek  [place], DO [facility]  If 7PM-7AM, please contact night-coverage [email or web address] 09/03/2019, 1:30 PM

## 2019-09-03 NOTE — Progress Notes (Signed)
ANTICOAGULATION CONSULT NOTE - Follow Up Consult  Pharmacy Consult for Heparin Indication: h/o LV thrombus  No Known Allergies  Patient Measurements: Height: 5\' 11"  (180.3 cm) Weight: 160 lb 0.9 oz (72.6 kg) IBW/kg (Calculated) : 75.3 Heparin Dosing Weight: 71.8 kg  Vital Signs: Temp: 98.3 F (36.8 C) (01/26 0650) Temp Source: Oral (01/26 0650) BP: 116/69 (01/26 0800) Pulse Rate: 92 (01/26 0800)  Labs: Recent Labs    09/01/19 0521 09/01/19 0521 09/01/19 1237 09/01/19 2214 09/02/19 0625 09/03/19 0500  HGB 8.6*   < >  --   --  8.5* 9.3*  HCT 27.2*  --   --   --  27.2* 29.4*  PLT 257  --   --   --  273 266  LABPROT 21.6*  --   --   --  22.1* 21.4*  INR 1.9*  --   --   --  1.9* 1.9*  HEPARINUNFRC 0.40  --    < > 0.59 0.39 0.42  CREATININE 5.43*  --   --   --  7.23* 8.46*   < > = values in this interval not displayed.    Estimated Creatinine Clearance: 8.8 mL/min (A) (by C-G formula based on SCr of 8.46 mg/dL (H)).  Assessment:  Patient is a 69 year old male with history of renal transplant in 05/2014 and now with ESRD on HD TTS. Pt had been on apixaban for left ventricular thombus, which has been switched to warfarin. Patient was placed on heparin bridge so that the [facility] can be removed. However, patient's AVF was not able to be cannulated on 1/14; therefore, plans have changed to keep the [facility] in for now. Pharmacy has been consulted to resume warfarin dosing and dose heparin. Of note, patient has also had a recent stroke.  Heparin level at goal INR still 1.9 today   Goal of Therapy:  Heparin level 0.3-0.5 units/ml due to recent CVA Monitor platelets by anticoagulation protocol: Yes  INR 2 to 3   Plan:  Continue heparin at 900 units / hr Warfarin 6 mg po x 1 today AM labs  Thank you Anette Guarneri, PharmD [phone]   09/03/2019       8:21 AM  Please check [email or web address] for unit-specific pharmacist phone numbers

## 2019-09-03 NOTE — Plan of Care (Signed)
  Problem: Nutrition: Goal: Risk of aspiration will decrease Outcome: Progressing   Problem: Nutrition: Goal: Dietary intake will improve Outcome: Progressing

## 2019-09-03 NOTE — Progress Notes (Addendum)
[facility] Progress Note   Subjective:   Patient seen and examined at bedside in dialysis.  Feeling well.  No new complaints.  Tolerating HD today with AVF.   Objective Vitals:   09/03/19 0900 09/03/19 0930 09/03/19 0958 09/03/19 1044  BP: 115/67 104/62 (!) 112/58 132/86  Pulse: 94 94 (!) 101 (!) 104  Resp:   17 18  Temp:   97.8 F (36.6 C) 97.9 F (36.6 C)  TempSrc:   Oral Oral  SpO2:   97% 98%  Weight:   70.1 kg   Height:       Physical Exam General: Heart:RRR Lungs:CTAB Extremities:no LE edema Dialysis Access: R IJ TDC, RU AVF +b   Filed Weights   09/03/19 0500 09/03/19 0650 09/03/19 0958  Weight: 73.6 kg 72.6 kg 70.1 kg    Intake/Output Summary (Last 24 hours) at 09/03/2019 1117 Last data filed at 09/03/2019 0958 Gross per 24 hour  Intake 660 ml  Output 2126 ml  Net -1466 ml    Additional Objective Labs: Basic Metabolic Panel: Recent Labs  Lab 09/01/19 0521 09/02/19 0625 09/03/19 0500  NA 136 133* 134*  K 4.7 4.8 5.6*  CL 101 98 102  CO2 25 23 23   GLUCOSE 191* 207* 155*  BUN 29* 44* 52*  CREATININE 5.43* 7.23* 8.46*  CALCIUM 8.1* 8.2* 8.6*  PHOS 2.7 2.5 3.1   Liver Function Tests: Recent Labs  Lab 09/01/19 0521 09/02/19 0625 09/03/19 0500  ALBUMIN 2.3* 2.2* 2.3*   CBC: Recent Labs  Lab 08/30/19 0551 08/30/19 0551 08/31/19 0615 08/31/19 0615 09/01/19 0521 09/02/19 0625 09/03/19 0500  WBC 9.0   < > 8.3   < > 8.6 10.6* 10.1  HGB 8.8*   < > 8.6*   < > 8.6* 8.5* 9.3*  HCT 27.8*   < > 27.8*   < > 27.2* 27.2* 29.4*  MCV 106.5*  --  108.2*  --  107.9* 107.1* 106.1*  PLT 235   < > 260   < > 257 273 266   < > = values in this interval not displayed.   Blood Culture    Component Value Date/Time   SDES BLOOD SITE NOT SPECIFIED 07/30/2019 1843   SPECREQUEST  07/30/2019 1843    BOTTLES DRAWN AEROBIC AND ANAEROBIC Blood Culture results may not be optimal due to an inadequate volume of blood received in culture bottles   CULT   07/30/2019 1843    NO GROWTH 5 DAYS Performed at [facility], [address], [place], [place] [address]    REPTSTATUS 08/04/2019 FINAL 07/30/2019 1843    CBG: Recent Labs  Lab 09/02/19 0732 09/02/19 1116 09/02/19 1612 09/02/19 2101 09/03/19 0631  GLUCAP 184* 210* 260* 311* 128*    Lab Results  Component Value Date   INR 1.9 (H) 09/03/2019   INR 1.9 (H) 09/02/2019   INR 1.9 (H) 09/01/2019   Studies/Results: No results found.  Medications: . heparin 900 Units/hr (09/03/19 0935)   .  stroke: mapping our early stages of recovery book   Does not apply Once  . allopurinol  100 mg Oral Daily  . aspirin  81 mg Oral Daily  . atorvastatin  40 mg Oral Daily  . Chlorhexidine Gluconate Cloth  6 each Topical Q0600  . Chlorhexidine Gluconate Cloth  6 each Topical Q0600  . darbepoetin (ARANESP) injection - DIALYSIS  150 mcg Intravenous Q Tue-HD  . feeding supplement (ENSURE ENLIVE)  237 mL Oral  TID BM  . feeding supplement (PRO-STAT SUGAR FREE 64)  30 mL Oral BID  . insulin aspart  0-6 Units Subcutaneous TID WC  . insulin aspart  2 Units Subcutaneous TID WC  . metoCLOPramide  5 mg Oral BID WC  . metoprolol succinate  25 mg Oral Daily  . multivitamin  1 tablet Oral QHS  . pantoprazole  40 mg Oral Daily  . predniSONE  5 mg Oral Q breakfast  . sodium chloride flush  3 mL Intravenous Q12H  . warfarin  6 mg Oral ONCE-1800  . Warfarin - Pharmacist Dosing Inpatient   Does not apply q1800    Dialysis Orders: TTSG/O -[facility] when COVID 19 negative 4 hr 15 min 180NRE 400/800 80.5 kg 2.0 K/ 2.0 Ca R AVF/RIJ TDC -Heparin 8000 units IV TIW -Mircera 75 mcg IV q 2 weeks (last dose 07/25/19) -Venofer 50 mg IV weekly -Parsabiv 7.5 mg IV TIW  Assessment/Plan: 1.Acute COVID-19 pneumonia - s/p remdesivir &Decadron. Per primary. Now out of 21 day isolation window but still required to have 2 negative COVID 19 test before he can [facility] to [facility]. Repeat test on 1/14 +. Pt can  dialyze at [facility] until COVID tests negative.  2.ESRD- on HD TTS while COVID +.AVF has been used, but not consistently.  HD today successful using AVF, will put in order to have [place] removed by IR.  Next HD 09/05/19 3. Anemiaof CKD-Hgbimproving, now 9.3.No acute blood loss.IncreasedAranesp to 171mcg qwk. Transfuse prn.tsat 34%. 4. Secondary hyperparathyroidism- Phos 3.1, CCa 9.6. No binders or VDRA. No parsabiv because not on formulary.  5.HTN/volume- Blood pressure well controlled. Does not appear volume overloaded, titrate down volume as tolerated. Well below EDW, will need to lower at d/c ~69-70kg 6. Nutrition- Albumin low. Dys 3 diet. Continue renal vit/prostat.  7.LV thrombus-Heparin bridging tocoumadin. Per primary 8.Acute CVA- etiology unclear. Per primary  Disposition: SNF placement when bed available/INR therapeutic.  Jen Mow, PA-C [facility] Pager: [phone] 09/03/2019,11:17 AM  LOS: 35 days   Pt seen, examined and agree w A/P as above.  Kelly Splinter  MD 09/03/2019, 11:26 AM

## 2019-09-04 LAB — CBC
HCT: 31.2 % — ABNORMAL LOW (ref 39.0–52.0)
Hemoglobin: 9.8 g/dL — ABNORMAL LOW (ref 13.0–17.0)
MCH: 33.7 pg (ref 26.0–34.0)
MCHC: 31.4 g/dL (ref 30.0–36.0)
MCV: 107.2 fL — ABNORMAL HIGH (ref 80.0–100.0)
Platelets: 278 10*3/uL (ref 150–400)
RBC: 2.91 MIL/uL — ABNORMAL LOW (ref 4.22–5.81)
RDW: 18.7 % — ABNORMAL HIGH (ref 11.5–15.5)
WBC: 10.3 10*3/uL (ref 4.0–10.5)
nRBC: 0 % (ref 0.0–0.2)

## 2019-09-04 LAB — RENAL FUNCTION PANEL
Albumin: 2.4 g/dL — ABNORMAL LOW (ref 3.5–5.0)
Anion gap: 10 (ref 5–15)
BUN: 35 mg/dL — ABNORMAL HIGH (ref 8–23)
CO2: 27 mmol/L (ref 22–32)
Calcium: 8.8 mg/dL — ABNORMAL LOW (ref 8.9–10.3)
Chloride: 97 mmol/L — ABNORMAL LOW (ref 98–111)
Creatinine, Ser: 6.73 mg/dL — ABNORMAL HIGH (ref 0.61–1.24)
GFR calc Af Amer: 9 mL/min — ABNORMAL LOW (ref >60)
GFR calc non Af Amer: 8 mL/min — ABNORMAL LOW (ref >60)
Glucose, Bld: 141 mg/dL — ABNORMAL HIGH (ref 70–99)
Phosphorus: 4.3 mg/dL (ref 2.5–4.6)
Potassium: 5.1 mmol/L (ref 3.5–5.1)
Sodium: 134 mmol/L — ABNORMAL LOW (ref 135–145)

## 2019-09-04 LAB — PROTIME-INR
INR: 1.8 — ABNORMAL HIGH (ref 0.8–1.2)
Prothrombin Time: 21 seconds — ABNORMAL HIGH (ref 11.4–15.2)

## 2019-09-04 LAB — GLUCOSE, CAPILLARY
Glucose-Capillary: 139 mg/dL — ABNORMAL HIGH (ref 70–99)
Glucose-Capillary: 168 mg/dL — ABNORMAL HIGH (ref 70–99)
Glucose-Capillary: 184 mg/dL — ABNORMAL HIGH (ref 70–99)
Glucose-Capillary: 200 mg/dL — ABNORMAL HIGH (ref 70–99)

## 2019-09-04 LAB — MAGNESIUM: Magnesium: 1.9 mg/dL (ref 1.7–2.4)

## 2019-09-04 LAB — HEPARIN LEVEL (UNFRACTIONATED): Heparin Unfractionated: 0.49 IU/mL (ref 0.30–0.70)

## 2019-09-04 MED ORDER — WARFARIN SODIUM 6 MG PO TABS
6.0000 mg | ORAL_TABLET | Freq: Once | ORAL | Status: AC
  Administered 2019-09-04: 6 mg via ORAL
  Filled 2019-09-04: qty 1

## 2019-09-04 NOTE — Progress Notes (Signed)
PROGRESS NOTE    Jonathan Fresch Sr.  [id number] DOB: 31-Aug-1950 DOA: 07/30/2019 PCP: Marton Redwood, MD     Brief Narrative:  Jonathan Niklas Sr. 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 MRI brain showed small infarct in the left pons left occiput. Neurology saw the patient and felt this was secondary to left ventricular thrombus; he was started on heparin. He was diagnosed with Covid 07/24/2019. Patient has recurrently tested positive but is asymptomatic and this is probably positivity from his index infection in December  New events last 24 hours / Subjective: Doing well this morning without physical complaints.   Assessment & Plan:   Principal Problem:   Acute respiratory failure due to COVID-19 [facility]) Active Problems:   Chronic hepatitis C without hepatic coma ([name])   Diabetes mellitus type 2, uncomplicated ([place])   History of coronary artery disease   Hypertension   ESRD (end stage renal disease) on dialysis ([place])   Acute respiratory failure with hypoxia ([name])   Cerebral thrombosis with cerebral infarction   Cerebral embolism with cerebral infarction   COVID-19 pneumonia -Initially tested positive in December 2020 and completed appropriate treatment.  No longer infectious and off of precautions at this point.  Assumed that patient would continue to test positive for 90 days from his initial positive test  Acute strokes left pons left occipital -Etiology unclear but though to be secondary to Covid versus LV thrombus -Continue ASA, lipitor, coumadin  -Appreciate neurology/stroke team -SNF placement recommended  ESRD/HD TTSa  -Nephrology following  LV thrombus -Coumadin and bridged with IV heparin  Prior cadaveric renal transplant -Continue  prednisone  Hyperglycemia, Pre-diabetes -Continue NovoLog  HTN -Continue toprol    DVT prophylaxis: Heparin/coumadin Code Status: Full Family Communication: None at bedside  Disposition Plan: Patient presented from home and currently PT recommendation is for SNF.  Discharge barrier is due to needing therapeutic INR and finding SNF placement as patient continues to test positive for Covid infection which was treated in December.   Consultants:   Neurology  Nephrology   Antimicrobials:  Anti-infectives (From admission, onward)   Start     Dose/Rate Route Frequency Ordered Stop   07/31/19 1000  remdesivir 100 mg in sodium chloride 0.9 % 100 mL IVPB     100 mg 200 mL/hr over 30 Minutes Intravenous Daily 07/30/19 2217 08/04/19 0748   07/31/19 0800  ceFEPIme (MAXIPIME) 1 g in sodium chloride 0.9 % 100 mL IVPB  Status:  Discontinued     1 g 200 mL/hr over 30 Minutes Intravenous Every 24 hours 07/31/19 0748 07/31/19 0816   07/31/19 0800  vancomycin (VANCOREADY) IVPB 1500 mg/300 mL  Status:  Discontinued     1,500 mg 150 mL/hr over 120 Minutes Intravenous  Once 07/31/19 0748 07/31/19 0816   07/31/19 0748  vancomycin variable dose per unstable renal function (pharmacist dosing)  Status:  Discontinued      Does not apply See admin instructions 07/31/19 0748 07/31/19 0816   07/30/19 2230  remdesivir 200 mg in sodium chloride 0.9% 250 mL IVPB     200 mg 580 mL/hr over 30 Minutes Intravenous Once 07/30/19 2217 07/31/19 0045        Objective: Vitals:   09/03/19 2036 09/04/19 0436 09/04/19 0500 09/04/19 0927  BP: 134/62 140/78  140/71  Pulse: (!) 101 100  96  Resp: 18 18  16   Temp: 98 F (36.7 C) 98 F (36.7 C)  98.2 F (36.8 C)  TempSrc:  Oral  Oral  SpO2: 96% 98%  99%  Weight:   70.2 kg   Height:        Intake/Output Summary (Last 24 hours) at 09/04/2019 1145 Last data filed at 09/04/2019 0900 Gross per 24 hour  Intake 464 ml  Output 300 ml  Net 164 ml   Filed  Weights   09/03/19 0650 09/03/19 0958 09/04/19 0500  Weight: 72.6 kg 70.1 kg 70.2 kg    Examination:  General exam: Appears calm and comfortable  Respiratory system: Clear to auscultation. Respiratory effort normal. No respiratory distress. No conversational dyspnea.  Cardiovascular system: S1 & S2 heard, RRR. No murmurs. No pedal edema. Gastrointestinal system: Abdomen is nondistended, soft and nontender. Normal bowel sounds heard. Central nervous system: Alert and oriented. No focal neurological deficits. Speech clear.  Extremities: Symmetric in appearance  Skin: No rashes, lesions or ulcers on exposed skin  Psychiatry: Judgement and insight appear normal. Mood & affect appropriate.   Data Reviewed: I have personally reviewed following labs and imaging studies  CBC: Recent Labs  Lab 08/31/19 0615 09/01/19 0521 09/02/19 0625 09/03/19 0500 09/04/19 0617  WBC 8.3 8.6 10.6* 10.1 10.3  HGB 8.6* 8.6* 8.5* 9.3* 9.8*  HCT 27.8* 27.2* 27.2* 29.4* 31.2*  MCV 108.2* 107.9* 107.1* 106.1* 107.2*  PLT 260 257 273 266 0000000   Basic Metabolic Panel: Recent Labs  Lab 08/30/19 0551 08/30/19 0551 08/31/19 0615 09/01/19 0521 09/02/19 0625 09/03/19 0500 09/04/19 0617  NA 133*   < > 137 136 133* 134* 134*  K 4.3   < > 4.8 4.7 4.8 5.6* 5.1  CL 97*   < > 102 101 98 102 97*  CO2 25   < > 24 25 23 23 27   GLUCOSE 171*   < > 185* 191* 207* 155* 141*  BUN 34*   < > 44* 29* 44* 52* 35*  CREATININE 5.83*   < > 7.63* 5.43* 7.23* 8.46* 6.73*  CALCIUM 8.1*   < > 8.4* 8.1* 8.2* 8.6* 8.8*  MG 1.9  --   --  1.8 1.7 2.1 1.9  PHOS 2.9   < > 3.1 2.7 2.5 3.1 4.3   < > = values in this interval not displayed.   GFR: Estimated Creatinine Clearance: 10.7 mL/min (A) (by C-G formula based on SCr of 6.73 mg/dL (H)). Liver Function Tests: Recent Labs  Lab 08/31/19 0615 09/01/19 0521 09/02/19 0625 09/03/19 0500 09/04/19 0617  ALBUMIN 2.4* 2.3* 2.2* 2.3* 2.4*   No results for input(s): LIPASE, AMYLASE  in the last 168 hours. No results for input(s): AMMONIA in the last 168 hours. Coagulation Profile: Recent Labs  Lab 08/31/19 0615 09/01/19 0521 09/02/19 0625 09/03/19 0500 09/04/19 0617  INR 1.7* 1.9* 1.9* 1.9* 1.8*   Cardiac Enzymes: No results for input(s): CKTOTAL, CKMB, CKMBINDEX, TROPONINI in the last 168 hours. BNP (last 3 results) No results for input(s): PROBNP in the last 8760 hours. HbA1C: No results for input(s): HGBA1C in the last 72 hours. CBG: Recent Labs  Lab 09/03/19 1137 09/03/19 1621 09/03/19 2034 09/04/19 0655 09/04/19 1109  GLUCAP 140* 146* 231* 139* 168*   Lipid Profile: No results for input(s): CHOL, HDL, LDLCALC, TRIG, CHOLHDL, LDLDIRECT in the last 72 hours. Thyroid Function Tests: No results for input(s): TSH, T4TOTAL, FREET4, T3FREE,  THYROIDAB in the last 72 hours. Anemia Panel: No results for input(s): VITAMINB12, FOLATE, FERRITIN, TIBC, IRON, RETICCTPCT in the last 72 hours. Sepsis Labs: No results for input(s): PROCALCITON, LATICACIDVEN in the last 168 hours.  No results found for this or any previous visit (from the past 240 hour(s)).    Radiology Studies: IR Removal Tun Cv Cath W/O FL  Result Date: 09/03/2019 INDICATION: Patient with history of ESRD on HD s/p placement of tunneled right IJ hemodialysis catheter at [place] approximately 1 year ago per wife. Patient now with functioning AVF. Request is made for removal of tunneled hemodialysis catheter EXAM: REMOVAL OF TUNNELED HEMODIALYSIS CATHETER MEDICATIONS: 8 mL of 1% lidocaine COMPLICATIONS: None immediate. PROCEDURE: Informed written consent was obtained from the patient's wife, Inesh Overmier, following an explanation of the procedure, risks, benefits and alternatives to treatment. A time out was performed prior to the initiation of the procedure. Maximal barrier sterile technique was utilized including caps, mask, sterile gowns, sterile gloves, large sterile drape, hand hygiene, and  Hibiclens. 1% lidocaine was injected under sterile conditions along the subcutaneous tunnel. Utilizing a combination of blunt dissection and gentle traction, the catheter was removed intact. Hemostasis was obtained with manual compression. A dressing was placed. The patient tolerated the procedure well without immediate post procedural complication. IMPRESSION: Successful removal of tunneled dialysis catheter. Read by: Earley Abide, PA-C Electronically Signed   By: Jacqulynn Cadet M.D.   On: 09/03/2019 15:34      Scheduled Meds:   stroke: mapping our early stages of recovery book   Does not apply Once   allopurinol  100 mg Oral Daily   aspirin  81 mg Oral Daily   atorvastatin  40 mg Oral Daily   Chlorhexidine Gluconate Cloth  6 each Topical Q0600   Chlorhexidine Gluconate Cloth  6 each Topical Q0600   darbepoetin (ARANESP) injection - DIALYSIS  150 mcg Intravenous Q Tue-HD   feeding supplement (ENSURE ENLIVE)  237 mL Oral TID BM   feeding supplement (PRO-STAT SUGAR FREE 64)  30 mL Oral BID   insulin aspart  0-6 Units Subcutaneous TID WC   insulin aspart  2 Units Subcutaneous TID WC   metoCLOPramide  5 mg Oral BID WC   metoprolol succinate  25 mg Oral Daily   multivitamin  1 tablet Oral QHS   pantoprazole  40 mg Oral Daily   predniSONE  5 mg Oral Q breakfast   sodium chloride flush  3 mL Intravenous Q12H   warfarin  6 mg Oral ONCE-1800   Warfarin - Pharmacist Dosing Inpatient   Does not apply q1800   Continuous Infusions:  heparin 900 Units/hr (09/03/19 0935)     LOS: 36 days      Time spent: 25 minutes   Dessa Phi, DO [facility] 09/04/2019, 11:45 AM   Available via Epic secure chat 7am-7pm After these hours, please refer to coverage provider listed on [email or web address]

## 2019-09-04 NOTE — Progress Notes (Addendum)
Occupational Therapy Treatment Patient Details Name: Jonathan Zahniser Sr. MRN: [id number] DOB: 08-Jul-1951 Today's Date: 09/04/2019    History of present illness 69 y.o. 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 Diagnosed with COVID 6 days prior to coming to ED, where chest x-ray showed multifocal pneumonia. Admitted 07/30/19 for treatment of acute encephalopathy, difficulty swallowing and pneumonia.MRI of the brain showed small acute infarcts in the left pons, left occipital lob and right frontal lobe in addition to chronic infarcts    OT comments  Pt making gradual progress towards OT goals, presents supine in bed willing to participate in therapy session. Pt continues to remain with flat affect, requiring up to mod cueing for initiating mobility/functional tasks and for safety throughout session. Pt tolerating transfer OOB to recliner with modA (HHA) overall. Pt reporting dizziness initially with transfer to recliner, BP taken and 140/71. Feel he remains appropriate for SNF level therapies at time of discharge. Will continue to follow acutely.   Follow Up Recommendations  SNF;Supervision/Assistance - 24 hour    Equipment Recommendations  None recommended by OT          Precautions / Restrictions Precautions Precautions: Fall Restrictions Weight Bearing Restrictions: No       Mobility Bed Mobility Overal bed mobility: Needs Assistance Bed Mobility: Supine to Sit     Supine to sit: Min assist     General bed mobility comments: cues to intiaite mobility task, requires assist to scoot hips towards EOB; increased time and effort   Transfers Overall transfer level: Needs assistance Equipment used: 1 person hand held assist Transfers: Sit to/from [facility] Sit to Stand: Mod  assist Stand pivot transfers: Mod assist       General transfer comment: mod cues for tecnique and safety, assist to power up, steadying assist throughout for taking steps to turn towards recliner     Balance Overall balance assessment: Needs assistance Sitting-balance support: Feet supported Sitting balance-Leahy Scale: Fair     Standing balance support: Bilateral upper extremity supported;During functional activity Standing balance-Leahy Scale: Poor                             ADL either performed or assessed with clinical judgement   ADL Overall ADL's : Needs assistance/impaired     Grooming: Set up;Min guard;Sitting                               Functional mobility during ADLs: Moderate assistance(stand pivot transfer ) General ADL Comments: pt remains with weakness, impaired cognition and decreased standing balance                       Cognition Arousal/Alertness: Awake/alert Behavior During Therapy: Flat affect Overall Cognitive Status: Impaired/Different from baseline Area of Impairment: Problem solving;Awareness;Safety/judgement;Following commands;Memory;Attention                   Current Attention Level: Selective Memory: Decreased recall of precautions;Decreased short-term memory Following Commands: Follows one step commands inconsistently;Follows one step commands with increased time Safety/Judgement: Decreased awareness of safety;Decreased awareness of deficits Awareness: Intellectual Problem Solving: Slow processing;Requires verbal cues;Decreased initiation;Requires tactile cues General Comments: requires min cues to initiate mobility tasks and for safety, fluctuating affect  Exercises     Shoulder Instructions       General Comments Pt reports dizziness once seated in recliner, BP taken and 140/71    Pertinent Vitals/ Pain       Pain Assessment: No/denies pain  Home Living                                           Prior Functioning/Environment              Frequency  Min 2X/week        Progress Toward Goals  OT Goals(current goals can now be found in the care plan section)  Progress towards OT goals: Progressing toward goals  Acute Rehab OT Goals Patient Stated Goal: none stated OT Goal Formulation: Patient unable to participate in goal setting Time For Goal Achievement: 09/18/19 Potential to Achieve Goals: Good  Plan Discharge plan remains appropriate;Frequency remains appropriate    Co-evaluation                 AM-PAC OT "6 Clicks" Daily Activity     Outcome Measure   Help from another person eating meals?: A Little Help from another person taking care of personal grooming?: A Little Help from another person toileting, which includes using toliet, bedpan, or urinal?: A Lot Help from another person bathing (including washing, rinsing, drying)?: A Lot Help from another person to put on and taking off regular upper body clothing?: A Little Help from another person to put on and taking off regular lower body clothing?: A Lot 6 Click Score: 15    End of Session Equipment Utilized During Treatment: Gait belt  OT Visit Diagnosis: Unsteadiness on feet (R26.81);Other symptoms and signs involving cognitive function;Other abnormalities of gait and mobility (R26.89)   Activity Tolerance Patient tolerated treatment well   Patient Left in chair;with call bell/phone within reach;with chair alarm set   Nurse Communication Mobility status        Time: [id number] OT Time Calculation (min): 20 min  Charges: OT General Charges $OT Visit: 1 Visit OT Treatments $Therapeutic Activity: 8-22 mins  Lou Cal, OT [facility] Pager [phone] Office [phone]    Jonathan [name] 09/04/2019, 1:42 PM

## 2019-09-04 NOTE — Progress Notes (Signed)
ANTICOAGULATION CONSULT NOTE - Follow Up Consult  Pharmacy Consult for Heparin Indication: h/o LV thrombus  No Known Allergies  Patient Measurements: Height: 5\' 11"  (180.3 cm) Weight: 154 lb 12.2 oz (70.2 kg) IBW/kg (Calculated) : 75.3 Heparin Dosing Weight: 71.8 kg  Vital Signs: Temp: 98 F (36.7 C) (01/27 0436) Temp Source: Oral (01/27 0436) BP: 140/78 (01/27 0436) Pulse Rate: 100 (01/27 0436)  Labs: Recent Labs    09/02/19 0625 09/02/19 0625 09/03/19 0500 09/04/19 0617  HGB 8.5*   < > 9.3* 9.8*  HCT 27.2*  --  29.4* 31.2*  PLT 273  --  266 278  LABPROT 22.1*  --  21.4* 21.0*  INR 1.9*  --  1.9* 1.8*  HEPARINUNFRC 0.39  --  0.42 0.49  CREATININE 7.23*  --  8.46* 6.73*   < > = values in this interval not displayed.    Estimated Creatinine Clearance: 10.7 mL/min (A) (by C-G formula based on SCr of 6.73 mg/dL (H)).  Assessment:  Patient is a 69 year old male with history of renal transplant in 05/2014 and now with ESRD on HD TTS. Pt had been on apixaban for left ventricular thombus, which has been switched to warfarin. Patient was placed on heparin bridge so that the [facility] can be removed. However, patient's AVF was not able to be cannulated on 1/14; therefore, plans have changed to keep the [facility] in for now. Pharmacy has been consulted to resume warfarin dosing and dose heparin. Of note, patient has also had a recent stroke.  Heparin level at goal (0.49) INR 1.8 today (patient did not receive dose on 1/25)   Goal of Therapy:  Heparin level 0.3-0.5 units/ml due to recent CVA Monitor platelets by anticoagulation protocol: Yes  INR 2 to 3   Plan:  Continue heparin at 900 units / hr Warfarin 6 mg po x 1 today AM labs  Sharone Picchi A. Levada Dy, PharmD, BCPS, FNKF Clinical Pharmacist [place] Please utilize Amion for appropriate phone number to reach the unit pharmacist ([place])

## 2019-09-04 NOTE — Progress Notes (Addendum)
[facility] Progress Note   Subjective:   Patient seen and examined in room.  When asked how he is doing stated "Not worth a damn".  Admits to headache.  Denies n/v/d, CP, abdominal pain, dizziness, weakness and fatigue. Refused physical exam.  Objective Vitals:   09/03/19 2036 09/04/19 0436 09/04/19 0500 09/04/19 0927  BP: 134/62 140/78  140/71  Pulse: (!) 101 100  96  Resp: 18 18  16   Temp: 98 F (36.7 C) 98 F (36.7 C)  98.2 F (36.8 C)  TempSrc:  Oral  Oral  SpO2: 96% 98%  99%  Weight:   70.2 kg   Height:       Physical Exam General:NAD, chronically ill appearing male, sitting in bedside chair Heart:refused exam Lungs:nml WOB Abdomen:refused exam Extremities:refused exam Dialysis Access: RU AVF   Filed Weights   09/03/19 0650 09/03/19 0958 09/04/19 0500  Weight: 72.6 kg 70.1 kg 70.2 kg    Intake/Output Summary (Last 24 hours) at 09/04/2019 1142 Last data filed at 09/04/2019 0900 Gross per 24 hour  Intake 464 ml  Output 300 ml  Net 164 ml    Additional Objective Labs: Basic Metabolic Panel: Recent Labs  Lab 09/02/19 0625 09/03/19 0500 09/04/19 0617  NA 133* 134* 134*  K 4.8 5.6* 5.1  CL 98 102 97*  CO2 23 23 27   GLUCOSE 207* 155* 141*  BUN 44* 52* 35*  CREATININE 7.23* 8.46* 6.73*  CALCIUM 8.2* 8.6* 8.8*  PHOS 2.5 3.1 4.3   Liver Function Tests: Recent Labs  Lab 09/02/19 0625 09/03/19 0500 09/04/19 0617  ALBUMIN 2.2* 2.3* 2.4*   CBC: Recent Labs  Lab 08/31/19 0615 08/31/19 0615 09/01/19 0521 09/01/19 0521 09/02/19 0625 09/03/19 0500 09/04/19 0617  WBC 8.3   < > 8.6   < > 10.6* 10.1 10.3  HGB 8.6*   < > 8.6*   < > 8.5* 9.3* 9.8*  HCT 27.8*   < > 27.2*   < > 27.2* 29.4* 31.2*  MCV 108.2*  --  107.9*  --  107.1* 106.1* 107.2*  PLT 260   < > 257   < > 273 266 278   < > = values in this interval not displayed.   CBG: Recent Labs  Lab 09/03/19 1137 09/03/19 1621 09/03/19 2034 09/04/19 0655 09/04/19 1109  GLUCAP  140* 146* 231* 139* 168*   Studies/Results: IR Removal Tun Cv Cath W/O FL  Result Date: 09/03/2019 INDICATION: Patient with history of ESRD on HD s/p placement of tunneled right IJ hemodialysis catheter at [place] approximately 1 year ago per wife. Patient now with functioning AVF. Request is made for removal of tunneled hemodialysis catheter EXAM: REMOVAL OF TUNNELED HEMODIALYSIS CATHETER MEDICATIONS: 8 mL of 1% lidocaine COMPLICATIONS: None immediate. PROCEDURE: Informed written consent was obtained from the patient's wife, Irish Gonyo, following an explanation of the procedure, risks, benefits and alternatives to treatment. A time out was performed prior to the initiation of the procedure. Maximal barrier sterile technique was utilized including caps, mask, sterile gowns, sterile gloves, large sterile drape, hand hygiene, and Hibiclens. 1% lidocaine was injected under sterile conditions along the subcutaneous tunnel. Utilizing a combination of blunt dissection and gentle traction, the catheter was removed intact. Hemostasis was obtained with manual compression. A dressing was placed. The patient tolerated the procedure well without immediate post procedural complication. IMPRESSION: Successful removal of tunneled dialysis catheter. Read by: Earley Abide, PA-C Electronically Signed   By: Dellis Filbert.D.  On: 09/03/2019 15:34    Medications: . heparin 900 Units/hr (09/03/19 0935)   .  stroke: mapping our early stages of recovery book   Does not apply Once  . allopurinol  100 mg Oral Daily  . aspirin  81 mg Oral Daily  . atorvastatin  40 mg Oral Daily  . Chlorhexidine Gluconate Cloth  6 each Topical Q0600  . Chlorhexidine Gluconate Cloth  6 each Topical Q0600  . darbepoetin (ARANESP) injection - DIALYSIS  150 mcg Intravenous Q Tue-HD  . feeding supplement (ENSURE ENLIVE)  237 mL Oral TID BM  . feeding supplement (PRO-STAT SUGAR FREE 64)  30 mL Oral BID  . insulin aspart  0-6 Units  Subcutaneous TID WC  . insulin aspart  2 Units Subcutaneous TID WC  . metoCLOPramide  5 mg Oral BID WC  . metoprolol succinate  25 mg Oral Daily  . multivitamin  1 tablet Oral QHS  . pantoprazole  40 mg Oral Daily  . predniSONE  5 mg Oral Q breakfast  . sodium chloride flush  3 mL Intravenous Q12H  . warfarin  6 mg Oral ONCE-1800  . Warfarin - Pharmacist Dosing Inpatient   Does not apply q1800    Dialysis Orders: TTSG/O -[facility] when COVID 19 negative 4 hr 15 min 180NRE 400/800 80.5 kg 2.0 K/ 2.0 Ca R AVF/RIJ TDC -Heparin 8000 units IV TIW -Mircera 75 mcg IV q 2 weeks (last dose 07/25/19) -Venofer 50 mg IV weekly -Parsabiv 7.5 mg IV TIW  Assessment/Plan: 1.Acute COVID-19 pneumonia - s/p remdesivir &Decadron. Per primary. Now out of 21 day isolation window but still required to have 2 negative COVID 19 test before he can return to [facility]. Repeat test on 1/14 +. Pt can dialyze at [facility] until COVID tests negative.  2.ESRD- on HD TTS while COVID +.TDC removed due to AVF successfully used.  K 5.1. Next HD 09/05/19 3. Anemiaof CKD-Hgbimproving, now 9.8.No acute blood loss.IncreasedAranesp to 166mcg qwk. Transfuse prn.tsat 34%. 4. Secondary hyperparathyroidism- Phos 4.3, CCa 9.6. No binders or VDRA. No parsabiv because not on formulary.  5.HTN/volume- Blood pressure well controlled. Does not appear volume overloaded, titrate down volume as tolerated. Well below EDW, will need to lower at d/c ~69-70kg 6. Nutrition- Albumin low. Dys 3 diet. Continue renal vit/prostat.  7.LV thrombus-Heparin bridging tocoumadin. Per primary 8.Acute CVA- etiology unclear. Per primary  Disposition: SNF placement when bed available/INR therapeutic.  Jen Mow, PA-C [facility] Pager: [phone] 09/04/2019,11:42 AM  LOS: 36 days

## 2019-09-04 NOTE — Progress Notes (Signed)
Nutrition Follow up   DOCUMENTATION CODES:   Not applicable(suspect PCM)  INTERVENTION:   Continue Ensure Enlive po TID, each supplement provides 350 kcal and 20 grams of protein  Continue 30 ml Prostat BID, each supplement provides 100 kcals and 15 grams protein.   Continue renal MVI daily   NUTRITION DIAGNOSIS:   Increased nutrient needs related to chronic illness(ESRD on HD) as evidenced by estimated needs.  Ongoing  GOAL:   Patient will meet greater than or equal to 90% of their needs   Not meeting   MONITOR:   PO intake, Supplement acceptance, Weight trends, Labs, I & O's, Skin  REASON FOR ASSESSMENT:   LOS    ASSESSMENT:   Patient with PMH significant for R kidney transplant, ESRD on HD, CVA, HTN, and DM. Presents this admission with COVID 19 PNA.   RD working remotely.  Spoke with pt via phone. Reports having great appetite. Had oatmeal without complication this am. Meal completions charted as 25-100% for his last eight meals (52% average). Taking Ensure twice daily as well as Prostat. RD encouraged oral intake and supplement compliance.   Suspect malnutrition given pt is under EDW.   EDW: 80.5 kg  Current weight: 70.2 kg  I/O: -629 ml since 1/13 UOP: 300 ml x 24 hrs  Last HD yesterday: 2126 ml net UF  Medications: aranesp, SS novolog, 5 mg reglan BID, rena-vit, prednisone  Labs: Na 134 (L) CBG 128-311  Diet Order:   Diet Order            DIET DYS 3 Room service appropriate? Yes; Fluid consistency: Thin  Diet effective now              EDUCATION NEEDS:   Not appropriate for education at this time  Skin:  Skin Assessment: Reviewed RN Assessment  Last BM:  1/26  Height:   Ht Readings from Last 1 Encounters:  07/31/19 5\' 11"  (1.803 m)    Weight:   Wt Readings from Last 1 Encounters:  09/04/19 70.2 kg    Ideal Body Weight:  78.2 kg  BMI:  Body mass index is 21.59 kg/m.  Estimated Nutritional Needs:   Kcal:  2300-2500  kcal  Protein:  115-130 grams  Fluid:  1000 ml + UOP   Mariana Single RD, LDN Clinical Nutrition Pager # [phone]

## 2019-09-05 LAB — CBC
HCT: 27.1 % — ABNORMAL LOW (ref 39.0–52.0)
Hemoglobin: 8.8 g/dL — ABNORMAL LOW (ref 13.0–17.0)
MCH: 34 pg (ref 26.0–34.0)
MCHC: 32.5 g/dL (ref 30.0–36.0)
MCV: 104.6 fL — ABNORMAL HIGH (ref 80.0–100.0)
Platelets: 264 10*3/uL (ref 150–400)
RBC: 2.59 MIL/uL — ABNORMAL LOW (ref 4.22–5.81)
RDW: 18 % — ABNORMAL HIGH (ref 11.5–15.5)
WBC: 10.6 10*3/uL — ABNORMAL HIGH (ref 4.0–10.5)
nRBC: 0.3 % — ABNORMAL HIGH (ref 0.0–0.2)

## 2019-09-05 LAB — RENAL FUNCTION PANEL
Albumin: 2.3 g/dL — ABNORMAL LOW (ref 3.5–5.0)
Anion gap: 12 (ref 5–15)
BUN: 49 mg/dL — ABNORMAL HIGH (ref 8–23)
CO2: 23 mmol/L (ref 22–32)
Calcium: 8.3 mg/dL — ABNORMAL LOW (ref 8.9–10.3)
Chloride: 98 mmol/L (ref 98–111)
Creatinine, Ser: 7.88 mg/dL — ABNORMAL HIGH (ref 0.61–1.24)
GFR calc Af Amer: 7 mL/min — ABNORMAL LOW (ref >60)
GFR calc non Af Amer: 6 mL/min — ABNORMAL LOW (ref >60)
Glucose, Bld: 147 mg/dL — ABNORMAL HIGH (ref 70–99)
Phosphorus: 3.8 mg/dL (ref 2.5–4.6)
Potassium: 5.1 mmol/L (ref 3.5–5.1)
Sodium: 133 mmol/L — ABNORMAL LOW (ref 135–145)

## 2019-09-05 LAB — PROTIME-INR
INR: 2 — ABNORMAL HIGH (ref 0.8–1.2)
Prothrombin Time: 22.6 seconds — ABNORMAL HIGH (ref 11.4–15.2)

## 2019-09-05 LAB — GLUCOSE, CAPILLARY
Glucose-Capillary: 153 mg/dL — ABNORMAL HIGH (ref 70–99)
Glucose-Capillary: 135 mg/dL — ABNORMAL HIGH (ref 70–99)
Glucose-Capillary: 206 mg/dL — ABNORMAL HIGH (ref 70–99)
Glucose-Capillary: 267 mg/dL — ABNORMAL HIGH (ref 70–99)

## 2019-09-05 LAB — HEPARIN LEVEL (UNFRACTIONATED): Heparin Unfractionated: 0.33 IU/mL (ref 0.30–0.70)

## 2019-09-05 MED ORDER — WARFARIN SODIUM 6 MG PO TABS
6.0000 mg | ORAL_TABLET | Freq: Once | ORAL | Status: AC
  Administered 2019-09-05: 6 mg via ORAL
  Filled 2019-09-05: qty 1

## 2019-09-05 NOTE — Progress Notes (Signed)
[facility] Progress Note   Subjective:  Patient seen and examined at bedside in dialysis.  Headache improved.  No new complaints.    Objective Vitals:   09/05/19 0716 09/05/19 0730 09/05/19 0800 09/05/19 0830  BP: (!) 144/59 (!) 163/69 (!) 145/70 113/63  Pulse: 94 92 92 95  Resp:      Temp:      TempSrc:      SpO2: 98%     Weight:      Height:       Physical Exam General:NAD, chronically ill appearing male, laying in bed Heart:RRR Lungs:CTAB Abdomen:soft, NTND Extremities:no LE edema Dialysis Access: RU AVF accessed   [facility] Weights   09/03/19 0958 09/04/19 0500 09/05/19 0705  Weight: 70.1 kg 70.2 kg 70.7 kg    Intake/Output Summary (Last 24 hours) at 09/05/2019 0853 Last data filed at 09/05/2019 0600 Gross per 24 hour  Intake 618.05 ml  Output 200 ml  Net 418.05 ml    Additional Objective Labs: Basic Metabolic Panel: Recent Labs  Lab 09/03/19 0500 09/04/19 0617 09/05/19 0412  NA 134* 134* 133*  K 5.6* 5.1 5.1  CL 102 97* 98  CO2 23 27 23   GLUCOSE 155* 141* 147*  BUN 52* 35* 49*  CREATININE 8.46* 6.73* 7.88*  CALCIUM 8.6* 8.8* 8.3*  PHOS 3.1 4.3 3.8   Liver Function Tests: Recent Labs  Lab 09/03/19 0500 09/04/19 0617 09/05/19 0412  ALBUMIN 2.3* 2.4* 2.3*   CBC: Recent Labs  Lab 09/01/19 0521 09/01/19 0521 09/02/19 0625 09/02/19 0625 09/03/19 0500 09/04/19 0617 09/05/19 0720  WBC 8.6   < > 10.6*   < > 10.1 10.3 10.6*  HGB 8.6*   < > 8.5*   < > 9.3* 9.8* 8.8*  HCT 27.2*   < > 27.2*   < > 29.4* 31.2* 27.1*  MCV 107.9*  --  107.1*  --  106.1* 107.2* 104.6*  PLT 257   < > 273   < > 266 278 264   < > = values in this interval not displayed.   CBG: Recent Labs  Lab 09/04/19 0655 09/04/19 1109 09/04/19 1617 09/04/19 2016 09/05/19 0647  GLUCAP 139* 168* 184* 200* 153*    Lab Results  Component Value Date   INR 2.0 (H) 09/05/2019   INR 1.8 (H) 09/04/2019   INR 1.9 (H) 09/03/2019   Studies/Results: IR Removal Tun  Cv Cath W/O FL  Result Date: 09/03/2019 INDICATION: Patient with history of ESRD on HD s/p placement of tunneled right IJ hemodialysis catheter at [place] approximately 1 year ago per wife. Patient now with functioning AVF. Request is made for removal of tunneled hemodialysis catheter EXAM: REMOVAL OF TUNNELED HEMODIALYSIS CATHETER MEDICATIONS: 8 mL of 1% lidocaine COMPLICATIONS: None immediate. PROCEDURE: Informed written consent was obtained from the patient's wife, Gionny Heidorn, following an explanation of the procedure, risks, benefits and alternatives to treatment. A time out was performed prior to the initiation of the procedure. Maximal barrier sterile technique was utilized including caps, mask, sterile gowns, sterile gloves, large sterile drape, hand hygiene, and Hibiclens. 1% lidocaine was injected under sterile conditions along the subcutaneous tunnel. Utilizing a combination of blunt dissection and gentle traction, the catheter was removed intact. Hemostasis was obtained with manual compression. A dressing was placed. The patient tolerated the procedure well without immediate post procedural complication. IMPRESSION: Successful removal of tunneled dialysis catheter. Read by: Earley Abide, PA-C Electronically Signed   By: Dellis Filbert.D.  On: 09/03/2019 15:34    Medications:  .  stroke: mapping our early stages of recovery book   Does not apply Once  . allopurinol  100 mg Oral Daily  . aspirin  81 mg Oral Daily  . atorvastatin  40 mg Oral Daily  . Chlorhexidine Gluconate Cloth  6 each Topical Q0600  . Chlorhexidine Gluconate Cloth  6 each Topical Q0600  . darbepoetin (ARANESP) injection - DIALYSIS  150 mcg Intravenous Q Tue-HD  . feeding supplement (ENSURE ENLIVE)  237 mL Oral TID BM  . feeding supplement (PRO-STAT SUGAR FREE 64)  30 mL Oral BID  . insulin aspart  0-6 Units Subcutaneous TID WC  . insulin aspart  2 Units Subcutaneous TID WC  . metoCLOPramide  5 mg Oral BID WC  .  metoprolol succinate  25 mg Oral Daily  . multivitamin  1 tablet Oral QHS  . pantoprazole  40 mg Oral Daily  . predniSONE  5 mg Oral Q breakfast  . sodium chloride flush  3 mL Intravenous Q12H  . warfarin  6 mg Oral ONCE-1800  . Warfarin - Pharmacist Dosing Inpatient   Does not apply q1800    Dialysis Orders: TTSG/O -[facility] when COVID 19 negative 4 hr 15 min 180NRE 400/800 80.5 kg 2.0 K/ 2.0 Ca R AVF/RIJ TDC -Heparin 8000 units IV TIW -Mircera 75 mcg IV q 2 weeks (last dose 07/25/19) -Venofer 50 mg IV weekly -Parsabiv 7.5 mg IV TIW  Assessment/Plan: 1.Acute COVID-19 pneumonia - s/p remdesivir &Decadron. Per primary. Now out of 21 day isolation window but still required to have 2 negative COVID 19 test before he can return to [facility]. Repeat test on 1/14 +. Pt can dialyze at [facility] until COVID tests negative.  2.ESRD- on HD TTS while COVID +.TDC removed 1/26.  K 5.1.HD today per regular schedule.  3. Anemiaof CKD-Hgb8.8 today.No acute blood loss.Continue Aranesp to 11mcg qwk. Transfuse prn.tsat 34%. 4. Secondary hyperparathyroidism- Phos 3.8, CCa 9.6. No binders or VDRA. No parsabiv because not on formulary.  5.HTN/volume- Blood pressure mostly well controlled. Does not appear volume overloaded, titrate down volume as tolerated. Well below EDW, will need to lower at d/c~69-70kg 6. Nutrition- Albumin low. Dys 3 diet. Continue renal vit/prostat.  7.LV thrombus-Heparin bridging tocoumadin. Per primary 8.Acute CVA- etiology unclear. Per primary  Disposition: SNF placement when bed available/INR therapeutic.  Jen Mow, PA-C [facility] Pager: [phone] 09/05/2019,8:53 AM  LOS: 37 days

## 2019-09-05 NOTE — Plan of Care (Signed)
  Problem: Coping: Goal: Psychosocial and spiritual needs will be supported Outcome: Progressing   Problem: Self-Care: Goal: Ability to participate in self-care as condition permits will improve Outcome: Progressing

## 2019-09-05 NOTE — Progress Notes (Signed)
PROGRESS NOTE    Jonathan Boldon Sr.  [id number] DOB: 1950/11/09 DOA: 07/30/2019 PCP: Marton Redwood, MD     Brief Narrative:  Jonathan Bobier Sr. 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 MRI brain showed small infarct in the left pons left occiput. Neurology saw the patient and felt this was secondary to left ventricular thrombus; he was started on heparin. He was diagnosed with Covid 07/24/2019. Patient has recurrently tested positive but is asymptomatic and this is probably positivity from his index infection in December.  New events last 24 hours / Subjective: Patient seen in dialysis unit.  No complaints today.  Assessment & Plan:   Principal Problem:   Acute respiratory failure due to COVID-19 [facility]) Active Problems:   Chronic hepatitis C without hepatic coma ([name])   Diabetes mellitus type 2, uncomplicated ([place])   History of coronary artery disease   Hypertension   ESRD (end stage renal disease) on dialysis ([place])   Acute respiratory failure with hypoxia ([name])   Cerebral thrombosis with cerebral infarction   Cerebral embolism with cerebral infarction   COVID-19 pneumonia -Initially tested positive in December 2020 and completed appropriate treatment.  No longer infectious and off of precautions at this point.  Assumed that patient would continue to test positive for 90 days from his initial positive test  Acute strokes left pons left occipital -Etiology unclear but though to be secondary to Covid versus LV thrombus -Continue ASA, lipitor, coumadin  -Appreciate neurology/stroke team -SNF placement recommended  ESRD/HD TTSa  -Nephrology following  LV thrombus -Coumadin and bridged with IV heparin.  INR 2.0 today  Prior cadaveric renal  transplant -Continue prednisone  Hyperglycemia, Pre-diabetes -Continue NovoLog  HTN -Continue toprol    DVT prophylaxis: Heparin/coumadin Code Status: Full Family Communication: None at bedside  Disposition Plan: Patient presented from home and currently PT recommendation is for SNF.  Discharge barrier is due to finding SNF placement as patient continues to test positive for Covid infection which was treated in December.   Consultants:   Neurology  Nephrology   Antimicrobials:  Anti-infectives (From admission, onward)   Start     Dose/Rate Route Frequency Ordered Stop   07/31/19 1000  remdesivir 100 mg in sodium chloride 0.9 % 100 mL IVPB     100 mg 200 mL/hr over 30 Minutes Intravenous Daily 07/30/19 2217 08/04/19 0748   07/31/19 0800  ceFEPIme (MAXIPIME) 1 g in sodium chloride 0.9 % 100 mL IVPB  Status:  Discontinued     1 g 200 mL/hr over 30 Minutes Intravenous Every 24 hours 07/31/19 0748 07/31/19 0816   07/31/19 0800  vancomycin (VANCOREADY) IVPB 1500 mg/300 mL  Status:  Discontinued     1,500 mg 150 mL/hr over 120 Minutes Intravenous  Once 07/31/19 0748 07/31/19 0816   07/31/19 0748  vancomycin variable dose per unstable renal function (pharmacist dosing)  Status:  Discontinued      Does not apply See admin instructions 07/31/19 0748 07/31/19 0816   07/30/19 2230  remdesivir 200 mg in sodium chloride 0.9% 250 mL IVPB     200 mg 580 mL/hr over 30 Minutes Intravenous Once 07/30/19 2217 07/31/19 0045       Objective: Vitals:   09/05/19 0800 09/05/19 0830 09/05/19 0900 09/05/19 0930  BP: (!) 145/70  113/63 118/71 123/75  Pulse: 92 95 95 95  Resp:      Temp:      TempSrc:      SpO2:      Weight:      Height:        Intake/Output Summary (Last 24 hours) at 09/05/2019 1003 Last data filed at 09/05/2019 0600 Gross per 24 hour  Intake 518.05 ml  Output 200 ml  Net 318.05 ml   Filed Weights   09/03/19 0958 09/04/19 0500 09/05/19 0705  Weight: 70.1 kg 70.2  kg 70.7 kg    Examination: General exam: Appears calm and comfortable  Respiratory system: Clear to auscultation. Respiratory effort normal. Cardiovascular system: S1 & S2 heard, RRR. No pedal edema. Gastrointestinal system: Abdomen is nondistended, soft and nontender. Normal bowel sounds heard. Central nervous system: Alert and oriented. Non focal exam. Speech clear  Extremities: Symmetric in appearance bilaterally  Skin: No rashes, lesions or ulcers on exposed skin  Psychiatry: Judgement and insight appear stable. Mood & affect appropriate.    Data Reviewed: I have personally reviewed following labs and imaging studies  CBC: Recent Labs  Lab 09/01/19 0521 09/02/19 0625 09/03/19 0500 09/04/19 0617 09/05/19 0720  WBC 8.6 10.6* 10.1 10.3 10.6*  HGB 8.6* 8.5* 9.3* 9.8* 8.8*  HCT 27.2* 27.2* 29.4* 31.2* 27.1*  MCV 107.9* 107.1* 106.1* 107.2* 104.6*  PLT 257 273 266 278 [id number]   Basic Metabolic Panel: Recent Labs  Lab 08/30/19 0551 08/31/19 0615 09/01/19 0521 09/02/19 0625 09/03/19 0500 09/04/19 0617 09/05/19 0412  NA 133*   < > 136 133* 134* 134* 133*  K 4.3   < > 4.7 4.8 5.6* 5.1 5.1  CL 97*   < > 101 98 102 97* 98  CO2 25   < > 25 23 23 27 23   GLUCOSE 171*   < > 191* 207* 155* 141* 147*  BUN 34*   < > 29* 44* 52* 35* 49*  CREATININE 5.83*   < > 5.43* 7.23* 8.46* 6.73* 7.88*  CALCIUM 8.1*   < > 8.1* 8.2* 8.6* 8.8* 8.3*  MG 1.9  --  1.8 1.7 2.1 1.9  --   PHOS 2.9   < > 2.7 2.5 3.1 4.3 3.8   < > = values in this interval not displayed.   GFR: Estimated Creatinine Clearance: 9.2 mL/min (A) (by C-G formula based on SCr of 7.88 mg/dL (H)). Liver Function Tests: Recent Labs  Lab 09/01/19 0521 09/02/19 0625 09/03/19 0500 09/04/19 0617 09/05/19 0412  ALBUMIN 2.3* 2.2* 2.3* 2.4* 2.3*   No results for input(s): LIPASE, AMYLASE in the last 168 hours. No results for input(s): AMMONIA in the last 168 hours. Coagulation Profile: Recent Labs  Lab 09/01/19 0521  09/02/19 0625 09/03/19 0500 09/04/19 0617 09/05/19 0412  INR 1.9* 1.9* 1.9* 1.8* 2.0*   Cardiac Enzymes: No results for input(s): CKTOTAL, CKMB, CKMBINDEX, TROPONINI in the last 168 hours. BNP (last 3 results) No results for input(s): PROBNP in the last 8760 hours. HbA1C: No results for input(s): HGBA1C in the last 72 hours. CBG: Recent Labs  Lab 09/04/19 0655 09/04/19 1109 09/04/19 1617 09/04/19 2016 09/05/19 0647  GLUCAP 139* 168* 184* 200* 153*   Lipid Profile: No results for input(s): CHOL, HDL, LDLCALC, TRIG, CHOLHDL, LDLDIRECT in the last 72 hours. Thyroid Function Tests: No results for input(s): TSH, T4TOTAL, FREET4, T3FREE, THYROIDAB in the last 72 hours. Anemia Panel: No results for input(s): VITAMINB12, FOLATE, FERRITIN, TIBC, IRON, RETICCTPCT  in the last 72 hours. Sepsis Labs: No results for input(s): PROCALCITON, LATICACIDVEN in the last 168 hours.  No results found for this or any previous visit (from the past 240 hour(s)).    Radiology Studies: IR Removal Tun Cv Cath W/O FL  Result Date: 09/03/2019 INDICATION: Patient with history of ESRD on HD s/p placement of tunneled right IJ hemodialysis catheter at [place] approximately 1 year ago per wife. Patient now with functioning AVF. Request is made for removal of tunneled hemodialysis catheter EXAM: REMOVAL OF TUNNELED HEMODIALYSIS CATHETER MEDICATIONS: 8 mL of 1% lidocaine COMPLICATIONS: None immediate. PROCEDURE: Informed written consent was obtained from the patient's wife, Alexys Orris, following an explanation of the procedure, risks, benefits and alternatives to treatment. A time out was performed prior to the initiation of the procedure. Maximal barrier sterile technique was utilized including caps, mask, sterile gowns, sterile gloves, large sterile drape, hand hygiene, and Hibiclens. 1% lidocaine was injected under sterile conditions along the subcutaneous tunnel. Utilizing a combination of blunt dissection and  gentle traction, the catheter was removed intact. Hemostasis was obtained with manual compression. A dressing was placed. The patient tolerated the procedure well without immediate post procedural complication. IMPRESSION: Successful removal of tunneled dialysis catheter. Read by: Earley Abide, PA-C Electronically Signed   By: Jacqulynn Cadet M.D.   On: 09/03/2019 15:34      Scheduled Meds:   stroke: mapping our early stages of recovery book   Does not apply Once   allopurinol  100 mg Oral Daily   aspirin  81 mg Oral Daily   atorvastatin  40 mg Oral Daily   Chlorhexidine Gluconate Cloth  6 each Topical Q0600   Chlorhexidine Gluconate Cloth  6 each Topical Q0600   darbepoetin (ARANESP) injection - DIALYSIS  150 mcg Intravenous Q Tue-HD   feeding supplement (ENSURE ENLIVE)  237 mL Oral TID BM   feeding supplement (PRO-STAT SUGAR FREE 64)  30 mL Oral BID   insulin aspart  0-6 Units Subcutaneous TID WC   insulin aspart  2 Units Subcutaneous TID WC   metoCLOPramide  5 mg Oral BID WC   metoprolol succinate  25 mg Oral Daily   multivitamin  1 tablet Oral QHS   pantoprazole  40 mg Oral Daily   predniSONE  5 mg Oral Q breakfast   sodium chloride flush  3 mL Intravenous Q12H   warfarin  6 mg Oral ONCE-1800   Warfarin - Pharmacist Dosing Inpatient   Does not apply q1800   Continuous Infusions:    LOS: 37 days      Time spent: 20 minutes   Dessa Phi, DO [facility] 09/05/2019, 10:03 AM   Available via Epic secure chat 7am-7pm After these hours, please refer to coverage provider listed on [email or web address]

## 2019-09-05 NOTE — Progress Notes (Signed)
ANTICOAGULATION CONSULT NOTE - Follow Up Consult  Pharmacy Consult for Heparin Indication: h/o LV thrombus  No Known Allergies  Patient Measurements: Height: 5\' 11"  (180.3 cm) Weight: 155 lb 13.8 oz (70.7 kg) IBW/kg (Calculated) : 75.3 Heparin Dosing Weight: 71.8 kg  Vital Signs: Temp: 99.7 F (37.6 C) (01/28 0705) Temp Source: Oral (01/28 0705) BP: 163/69 (01/28 0730) Pulse Rate: 92 (01/28 0730)  Labs: Recent Labs    09/03/19 0500 09/04/19 0617 09/05/19 0412  HGB 9.3* 9.8*  --   HCT 29.4* 31.2*  --   PLT 266 278  --   LABPROT 21.4* 21.0* 22.6*  INR 1.9* 1.8* 2.0*  HEPARINUNFRC 0.42 0.49 0.33  CREATININE 8.46* 6.73* 7.88*    Estimated Creatinine Clearance: 9.2 mL/min (A) (by C-G formula based on SCr of 7.88 mg/dL (H)).  Assessment:  Patient is a 69 year old male with history of renal transplant in 05/2014 and now with ESRD on HD TTS. Pt had been on apixaban for left ventricular thombus, which has been switched to warfarin. Patient was placed on heparin bridge so that the [facility] can be removed. However, patient's AVF was not able to be cannulated on 1/14; therefore, plans have changed to keep the [facility] in for now. Pharmacy has been consulted to resume warfarin dosing and dose heparin. Of note, patient has also had a recent stroke.  Heparin level at goal (0.33) INR 2 today    Goal of Therapy:  Heparin level 0.3-0.5 units/ml due to recent CVA Monitor platelets by anticoagulation protocol: Yes  INR 2 to 3   Plan:  Discontinue heparin now that INR is 2 Warfarin 6 mg po x 1 today AM labs  Aniyha Tate A. Levada Dy, PharmD, BCPS, FNKF Clinical Pharmacist [place] Please utilize Amion for appropriate phone number to reach the unit pharmacist ([place])

## 2019-09-05 NOTE — Progress Notes (Signed)
  Speech Language Pathology Treatment:    Patient Details Name: Jonathan Winningham Sr. MRN: [id number] DOB: Aug 04, 1951 Today's Date: 09/05/2019 Time: 6546-5035 SLP Time Calculation (min) (ACUTE ONLY): 16 min  Assessment / Plan / Recommendation Clinical Impression  Patient seen at bedside for cognitive-linguistic treatment. Patient alert, greeted SLP, but with limited verbalizations. Pt with eye gaze preference to left, but able to visually track to right with verbal and visual cues. Patient oriented to self, place, stated year as "2004." ST provided large print visual aid to help patient recall orientation facts and problem solving facts (related to call bell use). ST attempted to have patient read visual aids. Patient able to read year (2021), but did not read the rest aloud, despite multiple cues. Pt with limited participation as session continued on. Patient able to answer environmental yes/no questions with 80% accuracy. ST provided education re: call bell use and functional problem solving, patient did not verbalize in teach-back attempt. Pt did visually scan immediate environment to locate (with eyes) the call bell and nodded.  Recommend discontinuation of ST services at this time as patient has met some goals and has had limited progression with others due to limited patient participation. Pt will benefit from ST at the next venue of care.  Please re-consult ST should his participation level increase or new needs arise.  HPI        SLP Plan   Discharge from inpatient ST.       Recommendations  Diet recommendations: Dysphagia 3 (mechanical soft);Thin liquid                Follow up: SNF/ 24 hr assistance       [place], M.Ed., CCC-SLP Speech Therapy Acute Rehabilitation [phone]: Acute Rehab office [phone] - pager    Jonathan [name] 09/05/2019, 11:35 AM

## 2019-09-06 LAB — GLUCOSE, CAPILLARY
Glucose-Capillary: 151 mg/dL — ABNORMAL HIGH (ref 70–99)
Glucose-Capillary: 169 mg/dL — ABNORMAL HIGH (ref 70–99)
Glucose-Capillary: 270 mg/dL — ABNORMAL HIGH (ref 70–99)

## 2019-09-06 LAB — RENAL FUNCTION PANEL
Albumin: 2.4 g/dL — ABNORMAL LOW (ref 3.5–5.0)
Anion gap: 11 (ref 5–15)
BUN: 41 mg/dL — ABNORMAL HIGH (ref 8–23)
CO2: 27 mmol/L (ref 22–32)
Calcium: 8.5 mg/dL — ABNORMAL LOW (ref 8.9–10.3)
Chloride: 96 mmol/L — ABNORMAL LOW (ref 98–111)
Creatinine, Ser: 6.54 mg/dL — ABNORMAL HIGH (ref 0.61–1.24)
GFR calc Af Amer: 9 mL/min — ABNORMAL LOW (ref >60)
GFR calc non Af Amer: 8 mL/min — ABNORMAL LOW (ref >60)
Glucose, Bld: 213 mg/dL — ABNORMAL HIGH (ref 70–99)
Phosphorus: 3.9 mg/dL (ref 2.5–4.6)
Potassium: 4.7 mmol/L (ref 3.5–5.1)
Sodium: 134 mmol/L — ABNORMAL LOW (ref 135–145)

## 2019-09-06 LAB — PROTIME-INR
INR: 1.8 — ABNORMAL HIGH (ref 0.8–1.2)
Prothrombin Time: 21.1 seconds — ABNORMAL HIGH (ref 11.4–15.2)

## 2019-09-06 LAB — HEPARIN LEVEL (UNFRACTIONATED): Heparin Unfractionated: 0.13 IU/mL — ABNORMAL LOW (ref 0.30–0.70)

## 2019-09-06 MED ORDER — HEPARIN (PORCINE) 25000 UT/250ML-% IV SOLN
1050.0000 [IU]/h | INTRAVENOUS | Status: DC
  Administered 2019-09-06: 900 [IU]/h via INTRAVENOUS
  Filled 2019-09-06: qty 250

## 2019-09-06 MED ORDER — WARFARIN SODIUM 7.5 MG PO TABS
7.5000 mg | ORAL_TABLET | Freq: Once | ORAL | Status: AC
  Administered 2019-09-06: 7.5 mg via ORAL
  Filled 2019-09-06: qty 1

## 2019-09-06 NOTE — Clinical Social Work Note (Signed)
Search continues for SNF placement for patient. [facility] consulted regarding difficulty in  placement for patient.  Khia Dieterich Givens, MSW, LCSW Licensed Clinical Social Worker [place] [phone]

## 2019-09-06 NOTE — Progress Notes (Addendum)
ANTICOAGULATION CONSULT NOTE - Follow Up Consult  Pharmacy Consult for Heparin Indication: h/o LV thrombus  No Known Allergies  Patient Measurements: Height: 5\' 11"  (180.3 cm) Weight: 151 lb 14.4 oz (68.9 kg) IBW/kg (Calculated) : 75.3 Heparin Dosing Weight: 71.8 kg  Vital Signs: Temp: 98.5 F (36.9 C) (01/29 0900) Temp Source: Oral (01/29 0900) BP: 135/77 (01/29 0900) Pulse Rate: 85 (01/29 0900)  Labs: Recent Labs    09/04/19 0617 09/05/19 0412 09/05/19 0720 09/06/19 0854  HGB 9.8*  --  8.8*  --   HCT 31.2*  --  27.1*  --   PLT 278  --  264  --   LABPROT 21.0* 22.6*  --  21.1*  INR 1.8* 2.0*  --  1.8*  HEPARINUNFRC 0.49 0.33  --   --   CREATININE 6.73* 7.88*  --  6.54*    Estimated Creatinine Clearance: 10.8 mL/min (A) (by C-G formula based on SCr of 6.54 mg/dL (H)).  Assessment:  Patient is a 69 year old male with history of renal transplant in 05/2014 and now with ESRD on HD TTS. Pt had been on apixaban for left ventricular thombus, which has been switched to warfarin. Patient was placed on heparin bridge so that the [facility] can be removed. However, patient's AVF was not able to be cannulated on 1/14; therefore, plans have changed to keep the [facility] in for now. Pharmacy has been consulted to resume warfarin dosing and dose heparin. Of note, patient has also had a recent stroke.  Heparin level at goal (0.33) INR 1.8 today after three doses of 6mg    Goal of Therapy:  Monitor platelets by anticoagulation protocol: Yes  INR 2 to 3   Plan:  Warfarin 7.5 mg po x 1 today AM labs  Iysha Mishkin A. Levada Dy, PharmD, BCPS, FNKF Clinical Pharmacist [place] Please utilize Amion for appropriate phone number to reach the unit pharmacist ([place])  Addendum:  D/W primary MD, due to INR being <2 will restart heparin drip at previous therapeutic rate of 900 units/hr. HL 8 hours after that.

## 2019-09-06 NOTE — Progress Notes (Signed)
Occupational Therapy Treatment Patient Details Name: Jonathan Rupard Sr. MRN: [id number] DOB: 02/14/1951 Today's Date: 09/06/2019    History of present illness 69 y.o. 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 Diagnosed with COVID 6 days prior to coming to ED, where chest x-ray showed multifocal pneumonia. Admitted 07/30/19 for treatment of acute encephalopathy, difficulty swallowing and pneumonia.MRI of the brain showed small acute infarcts in the left pons, left occipital lob and right frontal lobe in addition to chronic infarcts    OT comments  Pt. Seen for skilled OT.  Focus of session was bed mobility and transfer to/from bsc.  Pt. Requires mod a during functional mobility and max instructional cues for initiation and completion of tasks.  Pauses during task and requires continued cueing for completion.    Follow Up Recommendations  SNF;Supervision/Assistance - 24 hour    Equipment Recommendations  None recommended by OT    Recommendations for Other Services      Precautions / Restrictions Precautions Precautions: Fall       Mobility Bed Mobility Overal bed mobility: Needs Assistance Bed Mobility: Supine to Sit     Supine to sit: Min assist Sit to supine: Min guard   General bed mobility comments: cues to intiaite mobility task, requires assist to scoot hips towards EOB; increased time and effort   Transfers Overall transfer level: Needs assistance Equipment used: Rolling walker (2 wheeled) Transfers: Sit to/from [facility] Sit to Stand: Mod assist Stand pivot transfers: Mod assist       General transfer comment: mod cues for tecnique and safety, assist to power up, steadying assist throughout for taking steps to turn towards bsc then turn towards bed.  attempting to  sit each time before he had completed pivot but then notable frustration with required safety cues    Balance                                           ADL either performed or assessed with clinical judgement   ADL Overall ADL's : Needs assistance/impaired                         Toilet Transfer: Moderate assistance;Cueing for safety;Cueing for sequencing;Stand-pivot;RW;BSC   Toileting- Clothing Manipulation and Hygiene: Maximal assistance;Sit to/from stand Toileting - Clothing Manipulation Details (indicate cue type and reason): simulated during transfer to/from bsc (pt. did not end up needing to go so no actual hygiene needs today)     Functional mobility during ADLs: Moderate assistance;Cueing for safety;Cueing for sequencing General ADL Comments: pt remains with weakness, impaired cognition and decreased standing balance.     Vision       Perception     Praxis      Cognition Arousal/Alertness: Awake/alert Behavior During Therapy: Flat affect Overall Cognitive Status: Impaired/Different from baseline Area of Impairment: Problem solving;Awareness;Safety/judgement;Following commands;Memory;Attention                   Current Attention Level: Selective Memory: Decreased recall of precautions;Decreased short-term memory Following Commands: Follows one step commands inconsistently;Follows one step commands with increased time Safety/Judgement: Decreased awareness of safety;Decreased awareness of deficits Awareness: Intellectual Problem Solving: Slow processing;Requires verbal cues;Decreased initiation;Requires tactile cues General  Comments: requires min cues to initiate mobility tasks and for safety, fluctuating affect.  would stop mid task, requiring cues but then became frustrated "why are you asking me so many questions".  i was providing step by step cues and instructions but he used the word questions.        Exercises     Shoulder  Instructions       General Comments      Pertinent Vitals/ Pain       Pain Assessment: No/denies pain  Home Living                                          Prior Functioning/Environment              Frequency  Min 2X/week        Progress Toward Goals  OT Goals(current goals can now be found in the care plan section)  Progress towards OT goals: Progressing toward goals     Plan Discharge plan remains appropriate;Frequency remains appropriate    Co-evaluation                 AM-PAC OT "6 Clicks" Daily Activity     Outcome Measure   Help from another person eating meals?: A Little Help from another person taking care of personal grooming?: A Little Help from another person toileting, which includes using toliet, bedpan, or urinal?: A Lot Help from another person bathing (including washing, rinsing, drying)?: A Lot Help from another person to put on and taking off regular upper body clothing?: A Little Help from another person to put on and taking off regular lower body clothing?: A Lot 6 Click Score: 15    End of Session Equipment Utilized During Treatment: Gait belt;Rolling walker  OT Visit Diagnosis: Unsteadiness on feet (R26.81);Other symptoms and signs involving cognitive function;Other abnormalities of gait and mobility (R26.89)   Activity Tolerance Patient tolerated treatment well   Patient Left in bed;with call bell/phone within reach;with nursing/sitter in room   Nurse Communication          Time: [id number] OT Time Calculation (min): 11 min  Charges: OT General Charges $OT Visit: 1 Visit OT Treatments $Self Care/Home Management : 8-22 mins  Jonathan [name], COTA/L Acute Rehabilitation [phone]   Jonathan [name] 09/06/2019, 10:58 AM

## 2019-09-06 NOTE — Progress Notes (Addendum)
[facility] Progress Note   Dialysis Orders: TTSG/O -[facility] when COVID 19 negative 4 hr 15 min 180NRE 400/800 80.5 kg 2.0 K/ 2.0 Ca R AVF/RIJ TDC -Heparin 8000 units IV TIW -Mircera 75 mcg IV q 2 weeks (last dose 07/25/19) -Venofer 50 mg IV weekly -Parsabiv 7.5 mg IV TIW  Assessment/Plan: 1.Acute COVID-19 pneumonia - s/p remdesivir &Decadron. Per primary. Now out of 21 day isolation window but still required to have 2 negative COVID 19 test before he can return to [facility]- last + 1/14. Pt can dialyze at [facility] until COVID tests negative.  2.ESRD- on HD TTS while COVID +.TDC removed 1/26. - next HD Saturday. Not much decrease in Cr and K since HD yesterday - Cr 7.88 > 6.54 - running short tmt due to volume - needs to run at least 3.5 hr (only ran 3 hr Thursday) 3. Anemiaof CKD-Hgb8.8 1/28 trending down.No acute blood loss.Continue Aranesp to 134mcg qwk. Transfuse prn.tsat 34%. 4. Secondary hyperparathyroidism-Ca/P ok. No binders or VDRA. No parsabiv because not on formulary.  5.HTN/volume- Blood pressure mostly well controlled. Does not appear volume overloaded, titrate down volume as tolerated. Net UF 2 L Thursday with post wt 68.9  Well below EDW, will need to lower at d/c~69-70kg 6. Nutrition- Albumin low. Dys 3 diet   Continue renal vit/prostat.  7.LV thrombus-Heparin bridging tocoumadin. Per primary INR 1.8 8.Acute CVA/ hx of LV thrombus (from prior admit)- etiology unclear. Per primary  Disposition: SNF placement when bed available/INR therapeutic.  Myriam Jacobson, PA-C [place] 09/06/2019,10:23 AM  LOS: 38 days   Subjective:   Tells me he dialyzes in [place]. Our records say otherwise  Objective Vitals:   09/05/19 1712 09/05/19 2022 09/06/19 0513 09/06/19 0900  BP: 124/63 128/66 (!) 118/53 135/77  Pulse: (!) 102 100 86 85  Resp: 18 18 18 18   Temp: 98.3 F (36.8 C) 98.6 F (37 C) 98.4 F (36.9  C) 98.5 F (36.9 C)  TempSrc: Oral Oral  Oral  SpO2: 96% 98% 97% 98%  Weight:      Height:       Physical Exam General: lying in bed supine breathing easily Heart: RRR Lungs: grossly clear Abdomen: soft NT Extremities: no LE edema Dialysis Access: right upper AVF + bruit   Additional Objective Labs: Basic Metabolic Panel: Recent Labs  Lab 09/04/19 0617 09/05/19 0412 09/06/19 0854  NA 134* 133* 134*  K 5.1 5.1 4.7  CL 97* 98 96*  CO2 27 23 27   GLUCOSE 141* 147* 213*  BUN 35* 49* 41*  CREATININE 6.73* 7.88* 6.54*  CALCIUM 8.8* 8.3* 8.5*  PHOS 4.3 3.8 3.9   Liver Function Tests: Recent Labs  Lab 09/04/19 0617 09/05/19 0412 09/06/19 0854  ALBUMIN 2.4* 2.3* 2.4*   No results for input(s): LIPASE, AMYLASE in the last 168 hours. CBC: Recent Labs  Lab 09/01/19 0521 09/01/19 0521 09/02/19 0625 09/02/19 0625 09/03/19 0500 09/04/19 0617 09/05/19 0720  WBC 8.6   < > 10.6*   < > 10.1 10.3 10.6*  HGB 8.6*   < > 8.5*   < > 9.3* 9.8* 8.8*  HCT 27.2*   < > 27.2*   < > 29.4* 31.2* 27.1*  MCV 107.9*  --  107.1*  --  106.1* 107.2* 104.6*  PLT 257   < > 273   < > 266 278 264   < > = values in this interval not displayed.   Blood Culture  Component Value Date/Time   SDES BLOOD SITE NOT SPECIFIED 07/30/2019 1843   SPECREQUEST  07/30/2019 1843    BOTTLES DRAWN AEROBIC AND ANAEROBIC Blood Culture results may not be optimal due to an inadequate volume of blood received in culture bottles   CULT  07/30/2019 1843    NO GROWTH 5 DAYS Performed at [facility], [place] [address], [place],  [address]    REPTSTATUS 08/04/2019 FINAL 07/30/2019 1843    Cardiac Enzymes: No results for input(s): CKTOTAL, CKMB, CKMBINDEX, TROPONINI in the last 168 hours. CBG: Recent Labs  Lab 09/05/19 0647 09/05/19 1121 09/05/19 1613 09/05/19 2021 09/06/19 0655  GLUCAP 153* 135* 206* 267* 151*   Iron Studies: No results for input(s): IRON, TIBC, TRANSFERRIN, FERRITIN in  the last 72 hours. Lab Results  Component Value Date   INR 1.8 (H) 09/06/2019   INR 2.0 (H) 09/05/2019   INR 1.8 (H) 09/04/2019   Studies/Results: No results found. Medications:  .  stroke: mapping our early stages of recovery book   Does not apply Once  . allopurinol  100 mg Oral Daily  . aspirin  81 mg Oral Daily  . atorvastatin  40 mg Oral Daily  . Chlorhexidine Gluconate Cloth  6 each Topical Q0600  . Chlorhexidine Gluconate Cloth  6 each Topical Q0600  . darbepoetin (ARANESP) injection - DIALYSIS  150 mcg Intravenous Q Tue-HD  . feeding supplement (ENSURE ENLIVE)  237 mL Oral TID BM  . feeding supplement (PRO-STAT SUGAR FREE 64)  30 mL Oral BID  . insulin aspart  0-6 Units Subcutaneous TID WC  . insulin aspart  2 Units Subcutaneous TID WC  . metoCLOPramide  5 mg Oral BID WC  . metoprolol succinate  25 mg Oral Daily  . multivitamin  1 tablet Oral QHS  . pantoprazole  40 mg Oral Daily  . predniSONE  5 mg Oral Q breakfast  . sodium chloride flush  3 mL Intravenous Q12H  . warfarin  7.5 mg Oral ONCE-1800  . Warfarin - Pharmacist Dosing Inpatient   Does not apply [phone]

## 2019-09-06 NOTE — Progress Notes (Signed)
ANTICOAGULATION CONSULT NOTE - Follow Up Consult  Pharmacy Consult for Heparin Indication: h/o LV thrombus  No Known Allergies  Patient Measurements: Height: 5\' 11"  (180.3 cm) Weight: 157 lb 3 oz (71.3 kg) IBW/kg (Calculated) : 75.3 Heparin Dosing Weight: 71.8 kg  Vital Signs: Temp: 97.9 F (36.6 C) (01/29 2016) Temp Source: Oral (01/29 2016) BP: 135/73 (01/29 2016) Pulse Rate: 89 (01/29 2016)  Labs: Recent Labs    09/04/19 0617 09/05/19 0412 09/05/19 0720 09/06/19 0854 09/06/19 2106  HGB 9.8*  --  8.8*  --   --   HCT 31.2*  --  27.1*  --   --   PLT 278  --  264  --   --   LABPROT 21.0* 22.6*  --  21.1*  --   INR 1.8* 2.0*  --  1.8*  --   HEPARINUNFRC 0.49 0.33  --   --  0.13*  CREATININE 6.73* 7.88*  --  6.54*  --     Estimated Creatinine Clearance: 11.2 mL/min (A) (by C-G formula based on SCr of 6.54 mg/dL (H)).  Assessment:  Patient is a 69 year old male with history of renal transplant in 05/2014 and now with ESRD on HD TTS. Pt had been on apixaban for left ventricular thombus, which has been switched to warfarin. Patient was placed on heparin bridge so that the [facility] can be removed. However, patient's AVF was not able to be cannulated on 1/14; therefore, plans have changed to keep the [name] in for now. Pharmacy has been consulted to resume warfarin dosing and dose heparin. Of note, patient has also had a recent stroke. -heparin level= 0.13 (was previously at goal on this rate)   Goal of Therapy:  Monitor platelets by anticoagulation protocol: Yes  INR 2 to 3 Heparin level 0.3-0.5   Plan:  -Increase heparin to 1050 units/hr -Heparin level in 8 hours and daily wth CBC daily    Hildred Laser, PharmD Clinical Pharmacist **Pharmacist phone directory can now be found on [email or web address] ([email or web address]).  Listed under [place].   Marland Kitchen

## 2019-09-06 NOTE — Plan of Care (Signed)
  Problem: Self-Care: Goal: Ability to participate in self-care as condition permits will improve Outcome: Progressing   Problem: Nutrition: Goal: Dietary intake will improve Outcome: Progressing

## 2019-09-06 NOTE — Progress Notes (Signed)
PROGRESS NOTE    Jonathan Cammon Sr.  [id number] DOB: 1951-04-05 DOA: 07/30/2019 PCP: Marton Redwood, MD     Brief Narrative:  Jonathan Toole Sr. 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 MRI brain showed small infarct in the left pons left occiput. Neurology saw the patient and felt this was secondary to left ventricular thrombus; he was started on heparin. He was diagnosed with Covid 07/24/2019. Patient has recurrently tested positive but is asymptomatic and this is probably positivity from his index infection in December.  New events last 24 hours / Subjective: No complaints on examination this morning, no acute events reported  Assessment & Plan:   Principal Problem:   Acute respiratory failure due to COVID-19 [facility]) Active Problems:   Chronic hepatitis C without hepatic coma ([name])   Diabetes mellitus type 2, uncomplicated ([place])   History of coronary artery disease   Hypertension   ESRD (end stage renal disease) on dialysis ([facility])   Acute respiratory failure with hypoxia ([name])   Cerebral thrombosis with cerebral infarction   Cerebral embolism with cerebral infarction   COVID-19 pneumonia -Initially tested positive in December 2020 and completed appropriate treatment.  No longer infectious and off of precautions at this point.  Assumed that patient would continue to test positive for 90 days from his initial positive test  Acute strokes left pons left occipital -Etiology unclear but though to be secondary to Covid versus LV thrombus -Continue ASA, lipitor, coumadin  -Appreciate neurology/stroke team -SNF placement recommended  ESRD/HD TTSa  -Nephrology following  LV thrombus -Coumadin per pharmacy.  INR 1.8 today  Prior cadaveric renal  transplant -Continue prednisone  Hyperglycemia, Pre-diabetes -Continue NovoLog  HTN -Continue toprol    DVT prophylaxis: Coumadin Code Status: Full Family Communication: None at bedside  Disposition Plan: Patient presented from home and currently PT recommendation is for SNF.  Discharge barrier is due to finding SNF placement as patient continues to test positive for Covid infection which was treated in December.   Consultants:   Neurology  Nephrology   Antimicrobials:  Anti-infectives (From admission, onward)   Start     Dose/Rate Route Frequency Ordered Stop   07/31/19 1000  remdesivir 100 mg in sodium chloride 0.9 % 100 mL IVPB     100 mg 200 mL/hr over 30 Minutes Intravenous Daily 07/30/19 2217 08/04/19 0748   07/31/19 0800  ceFEPIme (MAXIPIME) 1 g in sodium chloride 0.9 % 100 mL IVPB  Status:  Discontinued     1 g 200 mL/hr over 30 Minutes Intravenous Every 24 hours 07/31/19 0748 07/31/19 0816   07/31/19 0800  vancomycin (VANCOREADY) IVPB 1500 mg/300 mL  Status:  Discontinued     1,500 mg 150 mL/hr over 120 Minutes Intravenous  Once 07/31/19 0748 07/31/19 0816   07/31/19 0748  vancomycin variable dose per unstable renal function (pharmacist dosing)  Status:  Discontinued      Does not apply See admin instructions 07/31/19 0748 07/31/19 0816   07/30/19 2230  remdesivir 200 mg in sodium chloride 0.9% 250 mL IVPB     200 mg 580 mL/hr over 30 Minutes Intravenous Once 07/30/19 2217 07/31/19 0045       Objective: Vitals:   09/05/19 1712 09/05/19 2022 09/06/19 0513 09/06/19 0900  BP: 124/63 128/66 (!) 118/53  135/77  Pulse: (!) 102 100 86 85  Resp: 18 18 18 18   Temp: 98.3 F (36.8 C) 98.6 F (37 C) 98.4 F (36.9 C) 98.5 F (36.9 C)  TempSrc: Oral Oral  Oral  SpO2: 96% 98% 97% 98%  Weight:      Height:        Intake/Output Summary (Last 24 hours) at 09/06/2019 1057 Last data filed at 09/06/2019 0834 Gross per 24 hour  Intake 460 ml  Output 0 ml  Net 460 ml    Filed Weights   09/04/19 0500 09/05/19 0705 09/05/19 1019  Weight: 70.2 kg 70.7 kg 68.9 kg    Examination: General exam: Appears calm and comfortable  Respiratory system: Clear to auscultation. Respiratory effort normal. Cardiovascular system: S1 & S2 heard, RRR. No pedal edema. Gastrointestinal system: Abdomen is nondistended, soft and nontender. Normal bowel sounds heard. Central nervous system: Alert and oriented. Non focal exam. Speech clear  Extremities: Symmetric in appearance bilaterally  Skin: No rashes, lesions or ulcers on exposed skin  Psychiatry: Judgement and insight appear stable. Mood & affect appropriate.   Data Reviewed: I have personally reviewed following labs and imaging studies  CBC: Recent Labs  Lab 09/01/19 0521 09/02/19 0625 09/03/19 0500 09/04/19 0617 09/05/19 0720  WBC 8.6 10.6* 10.1 10.3 10.6*  HGB 8.6* 8.5* 9.3* 9.8* 8.8*  HCT 27.2* 27.2* 29.4* 31.2* 27.1*  MCV 107.9* 107.1* 106.1* 107.2* 104.6*  PLT 257 273 266 278 [id number]   Basic Metabolic Panel: Recent Labs  Lab 09/01/19 0521 09/01/19 0521 09/02/19 0625 09/03/19 0500 09/04/19 0617 09/05/19 0412 09/06/19 0854  NA 136   < > 133* 134* 134* 133* 134*  K 4.7   < > 4.8 5.6* 5.1 5.1 4.7  CL 101   < > 98 102 97* 98 96*  CO2 25   < > 23 23 27 23 27   GLUCOSE 191*   < > 207* 155* 141* 147* 213*  BUN 29*   < > 44* 52* 35* 49* 41*  CREATININE 5.43*   < > 7.23* 8.46* 6.73* 7.88* 6.54*  CALCIUM 8.1*   < > 8.2* 8.6* 8.8* 8.3* 8.5*  MG 1.8  --  1.7 2.1 1.9  --   --   PHOS 2.7   < > 2.5 3.1 4.3 3.8 3.9   < > = values in this interval not displayed.   GFR: Estimated Creatinine Clearance: 10.8 mL/min (A) (by C-G formula based on SCr of 6.54 mg/dL (H)). Liver Function Tests: Recent Labs  Lab 09/02/19 0625 09/03/19 0500 09/04/19 0617 09/05/19 0412 09/06/19 0854  ALBUMIN 2.2* 2.3* 2.4* 2.3* 2.4*   No results for input(s): LIPASE, AMYLASE in the last 168 hours. No results for input(s): AMMONIA  in the last 168 hours. Coagulation Profile: Recent Labs  Lab 09/02/19 0625 09/03/19 0500 09/04/19 0617 09/05/19 0412 09/06/19 0854  INR 1.9* 1.9* 1.8* 2.0* 1.8*   Cardiac Enzymes: No results for input(s): CKTOTAL, CKMB, CKMBINDEX, TROPONINI in the last 168 hours. BNP (last 3 results) No results for input(s): PROBNP in the last 8760 hours. HbA1C: No results for input(s): HGBA1C in the last 72 hours. CBG: Recent Labs  Lab 09/05/19 0647 09/05/19 1121 09/05/19 1613 09/05/19 2021 09/06/19 0655  GLUCAP 153* 135* 206* 267* 151*   Lipid Profile: No results for input(s): CHOL, HDL, LDLCALC, TRIG, CHOLHDL, LDLDIRECT in the last 72 hours. Thyroid Function Tests: No results for input(s): TSH, T4TOTAL, FREET4, T3FREE, THYROIDAB in the last 72 hours.  Anemia Panel: No results for input(s): VITAMINB12, FOLATE, FERRITIN, TIBC, IRON, RETICCTPCT in the last 72 hours. Sepsis Labs: No results for input(s): PROCALCITON, LATICACIDVEN in the last 168 hours.  No results found for this or any previous visit (from the past 240 hour(s)).    Radiology Studies: No results found.    Scheduled Meds: .  stroke: mapping our early stages of recovery book   Does not apply Once  . allopurinol  100 mg Oral Daily  . aspirin  81 mg Oral Daily  . atorvastatin  40 mg Oral Daily  . Chlorhexidine Gluconate Cloth  6 each Topical Q0600  . Chlorhexidine Gluconate Cloth  6 each Topical Q0600  . darbepoetin (ARANESP) injection - DIALYSIS  150 mcg Intravenous Q Tue-HD  . feeding supplement (ENSURE ENLIVE)  237 mL Oral TID BM  . feeding supplement (PRO-STAT SUGAR FREE 64)  30 mL Oral BID  . insulin aspart  0-6 Units Subcutaneous TID WC  . insulin aspart  2 Units Subcutaneous TID WC  . metoCLOPramide  5 mg Oral BID WC  . metoprolol succinate  25 mg Oral Daily  . multivitamin  1 tablet Oral QHS  . pantoprazole  40 mg Oral Daily  . predniSONE  5 mg Oral Q breakfast  . sodium chloride flush  3 mL Intravenous  Q12H  . warfarin  7.5 mg Oral ONCE-1800  . Warfarin - Pharmacist Dosing Inpatient   Does not apply q1800   Continuous Infusions:    LOS: 38 days      Time spent: 20 minutes   Dessa Phi, DO [facility] 09/06/2019, 10:57 AM   Available via Epic secure chat 7am-7pm After these hours, please refer to coverage provider listed on [email or web address]

## 2019-09-07 LAB — CBC
HCT: 29.9 % — ABNORMAL LOW (ref 39.0–52.0)
Hemoglobin: 9.5 g/dL — ABNORMAL LOW (ref 13.0–17.0)
MCH: 32.8 pg (ref 26.0–34.0)
MCHC: 31.8 g/dL (ref 30.0–36.0)
MCV: 103.1 fL — ABNORMAL HIGH (ref 80.0–100.0)
Platelets: 279 10*3/uL (ref 150–400)
RBC: 2.9 MIL/uL — ABNORMAL LOW (ref 4.22–5.81)
RDW: 17.7 % — ABNORMAL HIGH (ref 11.5–15.5)
WBC: 10.2 10*3/uL (ref 4.0–10.5)
nRBC: 0 % (ref 0.0–0.2)

## 2019-09-07 LAB — RENAL FUNCTION PANEL
Albumin: 2.3 g/dL — ABNORMAL LOW (ref 3.5–5.0)
Anion gap: 12 (ref 5–15)
BUN: 52 mg/dL — ABNORMAL HIGH (ref 8–23)
CO2: 25 mmol/L (ref 22–32)
Calcium: 8.5 mg/dL — ABNORMAL LOW (ref 8.9–10.3)
Chloride: 94 mmol/L — ABNORMAL LOW (ref 98–111)
Creatinine, Ser: 7.67 mg/dL — ABNORMAL HIGH (ref 0.61–1.24)
GFR calc Af Amer: 8 mL/min — ABNORMAL LOW (ref >60)
GFR calc non Af Amer: 7 mL/min — ABNORMAL LOW (ref >60)
Glucose, Bld: 132 mg/dL — ABNORMAL HIGH (ref 70–99)
Phosphorus: 2.9 mg/dL (ref 2.5–4.6)
Potassium: 5.1 mmol/L (ref 3.5–5.1)
Sodium: 131 mmol/L — ABNORMAL LOW (ref 135–145)

## 2019-09-07 LAB — GLUCOSE, CAPILLARY
Glucose-Capillary: 152 mg/dL — ABNORMAL HIGH (ref 70–99)
Glucose-Capillary: 127 mg/dL — ABNORMAL HIGH (ref 70–99)
Glucose-Capillary: 205 mg/dL — ABNORMAL HIGH (ref 70–99)
Glucose-Capillary: 174 mg/dL — ABNORMAL HIGH (ref 70–99)

## 2019-09-07 LAB — PROTIME-INR
INR: 2.1 — ABNORMAL HIGH (ref 0.8–1.2)
Prothrombin Time: 23.1 seconds — ABNORMAL HIGH (ref 11.4–15.2)

## 2019-09-07 LAB — HEPARIN LEVEL (UNFRACTIONATED): Heparin Unfractionated: 0.2 IU/mL — ABNORMAL LOW (ref 0.30–0.70)

## 2019-09-07 MED ORDER — HEPARIN SODIUM (PORCINE) 1000 UNIT/ML DIALYSIS: 1000.0000 [IU] | INTRAMUSCULAR | Status: DC | PRN

## 2019-09-07 MED ORDER — SODIUM CHLORIDE 0.9 % IV SOLN: 100.0000 mL | INTRAVENOUS | Status: DC | PRN

## 2019-09-07 MED ORDER — LIDOCAINE-PRILOCAINE 2.5-2.5 % EX CREA: 1.0000 "application " | TOPICAL_CREAM | CUTANEOUS | Status: DC | PRN

## 2019-09-07 MED ORDER — PENTAFLUOROPROP-TETRAFLUOROETH EX AERO: 1.0000 "application " | INHALATION_SPRAY | CUTANEOUS | Status: DC | PRN

## 2019-09-07 MED ORDER — WARFARIN SODIUM 7.5 MG PO TABS
7.5000 mg | ORAL_TABLET | Freq: Once | ORAL | Status: AC
  Administered 2019-09-07: 7.5 mg via ORAL
  Filled 2019-09-07: qty 1

## 2019-09-07 MED ORDER — LIDOCAINE HCL (PF) 1 % IJ SOLN: 5.0000 mL | INTRAMUSCULAR | Status: DC | PRN

## 2019-09-07 MED ORDER — ALTEPLASE 2 MG IJ SOLR: 2.0000 mg | Freq: Once | INTRAMUSCULAR | Status: DC | PRN

## 2019-09-07 MED ORDER — HEPARIN SODIUM (PORCINE) 1000 UNIT/ML DIALYSIS: 6000.0000 [IU] | Freq: Once | INTRAMUSCULAR | Status: DC

## 2019-09-07 NOTE — Progress Notes (Signed)
[facility] Progress Note   Dialysis Orders: TTSG/O -[facility] when COVID 19 negative 4 hr 15 min 180NRE 400/800 80.5 kg 2.0 K/ 2.0 Ca R AVF/RIJ TDC -Heparin 8000 units IV TIW -Mircera 75 mcg IV q 2 weeks (last dose 07/25/19) -Venofer 50 mg IV weekly -Parsabiv 7.5 mg IV TIW  Assessment/Plan: 1.Acute COVID-19 pneumonia - s/p remdesivir &Decadron. Per primary. Now out of 21 day isolation window but still required to have 2 negative COVID 19 test before he can return to [facility]- last + 1/14. Not retested since. Pt can dialyze at [facility] until COVID tests negative.  2.ESRD- on HD TTS while COVID +.TDC removed 1/26. - next HD Saturday. Not much decrease in Cr and K since HD yesterday - Cr 7.88 > 6.54 - running short tmt due to volume - needs to run at least 3.5 hr (only ran 3 hr Thursday)- K 5.1 today access seems to be running fine at full BFR 3. Anemiaof CKD-Hgb8.8 1/28> 9.5 No acute blood loss.Continue Aranesp to 156mcg qwk. Transfuse prn.tsat 34%. 4. Secondary hyperparathyroidism-Ca/P ok. No binders or VDRA. No parsabiv because not on formulary.  5.HTN/volume- Blood pressure mostly well controlled. Does not appear volume overloaded, titrate down volume as tolerated. Net UF 2 L Thursday with post wt 68.9  Well below EDW, will need to lower at d/c~69-70kg  goal today 2.5 SBP 140s on HD 6. Nutrition- Albumin low. Dys 3 diet   Continue renal vit/prostat.  7.LV thrombus-Heparin bridging tocoumadin. Per primary INR 2 8.Acute CVA/ hx of LV thrombus (from prior admit)- etiology unclear. Per primary  Disposition: SNF placement when bed available- having placement difficulties.  Myriam Jacobson, PA-C [facility] Beeper [phone] 09/07/2019,9:05 AM  LOS: 39 days   Subjective:   No c/o.  Doesn't know the name of his HD unit.  Objective Vitals:   09/06/19 1624 09/06/19 2016 09/06/19 2016 09/07/19 0539  BP: (!) 152/73  135/73 138/74   Pulse: 89  89 91  Resp: 17  18 18   Temp: 98.1 F (36.7 C) 97.9 F (36.6 C)  100.2 F (37.9 C)  TempSrc: Oral Oral  Oral  SpO2: 95%  100% 96%  Weight:  71.3 kg    Height:       Physical Exam General: lying in bed supine breathing easily on HD room air Heart: RRR Lungs: grossly clear Abdomen: soft NT Extremities: no LE edema Dialysis Access: right upper AVF Qb 400  Additional Objective Labs: Lab Results  Component Value Date   INR 2.1 (H) 09/07/2019   INR 1.8 (H) 09/06/2019   INR 2.0 (H) 123XX123   Basic Metabolic Panel: Recent Labs  Lab 09/05/19 0412 09/06/19 0854 09/07/19 0353  NA 133* 134* 131*  K 5.1 4.7 5.1  CL 98 96* 94*  CO2 23 27 25   GLUCOSE 147* 213* 132*  BUN 49* 41* 52*  CREATININE 7.88* 6.54* 7.67*  CALCIUM 8.3* 8.5* 8.5*  PHOS 3.8 3.9 2.9   Liver Function Tests: Recent Labs  Lab 09/05/19 0412 09/06/19 0854 09/07/19 0353  ALBUMIN 2.3* 2.4* 2.3*   No results for input(s): LIPASE, AMYLASE in the last 168 hours. CBC: Recent Labs  Lab 09/02/19 0625 09/02/19 0625 09/03/19 0500 09/03/19 0500 09/04/19 0617 09/05/19 0720 09/07/19 0353  WBC 10.6*   < > 10.1   < > 10.3 10.6* 10.2  HGB 8.5*   < > 9.3*   < > 9.8* 8.8* 9.5*  HCT 27.2*   < >  29.4*   < > 31.2* 27.1* 29.9*  MCV 107.1*  --  106.1*  --  107.2* 104.6* 103.1*  PLT 273   < > 266   < > 278 264 279   < > = values in this interval not displayed.   Blood Culture    Component Value Date/Time   SDES BLOOD SITE NOT SPECIFIED 07/30/2019 1843   SPECREQUEST  07/30/2019 1843    BOTTLES DRAWN AEROBIC AND ANAEROBIC Blood Culture results may not be optimal due to an inadequate volume of blood received in culture bottles   CULT  07/30/2019 1843    NO GROWTH 5 DAYS Performed at [facility], [place] [address], [place],  [address]    REPTSTATUS 08/04/2019 FINAL 07/30/2019 1843    Cardiac Enzymes: No results for input(s): CKTOTAL, CKMB, CKMBINDEX, TROPONINI in the last 168  hours. CBG: Recent Labs  Lab 09/05/19 2021 09/06/19 0655 09/06/19 1118 09/06/19 1622 09/07/19 0700  GLUCAP 267* 151* 169* 270* 152*   Iron Studies: No results for input(s): IRON, TIBC, TRANSFERRIN, FERRITIN in the last 72 hours. Lab Results  Component Value Date   INR 2.1 (H) 09/07/2019   INR 1.8 (H) 09/06/2019   INR 2.0 (H) 09/05/2019   Studies/Results: No results found. Medications: . sodium chloride    . sodium chloride    . sodium chloride    . sodium chloride     .  stroke: mapping our early stages of recovery book   Does not apply Once  . allopurinol  100 mg Oral Daily  . aspirin  81 mg Oral Daily  . atorvastatin  40 mg Oral Daily  . Chlorhexidine Gluconate Cloth  6 each Topical Q0600  . Chlorhexidine Gluconate Cloth  6 each Topical Q0600  . darbepoetin (ARANESP) injection - DIALYSIS  150 mcg Intravenous Q Tue-HD  . feeding supplement (ENSURE ENLIVE)  237 mL Oral TID BM  . feeding supplement (PRO-STAT SUGAR FREE 64)  30 mL Oral BID  . heparin  6,000 Units Dialysis Once in dialysis  . insulin aspart  0-6 Units Subcutaneous TID WC  . insulin aspart  2 Units Subcutaneous TID WC  . metoCLOPramide  5 mg Oral BID WC  . metoprolol succinate  25 mg Oral Daily  . multivitamin  1 tablet Oral QHS  . pantoprazole  40 mg Oral Daily  . predniSONE  5 mg Oral Q breakfast  . sodium chloride flush  3 mL Intravenous Q12H  . warfarin  7.5 mg Oral ONCE-1800  . Warfarin - Pharmacist Dosing Inpatient   Does not apply [phone]

## 2019-09-07 NOTE — Progress Notes (Signed)
ANTICOAGULATION CONSULT NOTE - Follow Up Consult  Pharmacy Consult for Heparin Indication: h/o LV thrombus  No Known Allergies  Patient Measurements: Height: 5\' 11"  (180.3 cm) Weight: 157 lb 3 oz (71.3 kg) IBW/kg (Calculated) : 75.3 Heparin Dosing Weight: 71.8 kg  Vital Signs: Temp: 100.2 F (37.9 C) (01/30 0539) Temp Source: Oral (01/30 0539) BP: 138/74 (01/30 0539) Pulse Rate: 91 (01/30 0539)  Labs: Recent Labs    09/05/19 0412 09/05/19 0720 09/06/19 0854 09/06/19 2106 09/07/19 0353  HGB  --  8.8*  --   --  9.5*  HCT  --  27.1*  --   --  29.9*  PLT  --  264  --   --  279  LABPROT 22.6*  --  21.1*  --  23.1*  INR 2.0*  --  1.8*  --  2.1*  HEPARINUNFRC 0.33  --   --  0.13* 0.20*  CREATININE 7.88*  --  6.54*  --  7.67*    Estimated Creatinine Clearance: 9.6 mL/min (A) (by C-G formula based on SCr of 7.67 mg/dL (H)).  Assessment:  Patient is a 69 year old male with history of renal transplant in 05/2014 and now with ESRD on HD TTS. Pt had been on apixaban for left ventricular thombus, which has been switched to warfarin. Patient was placed on heparin bridge so that the [facility] can be removed. TDC removed 1/26. Pharmacy has been consulted to resume warfarin dosing and dose heparin. Of note, patient has also had a recent stroke.  This morning, INR was therapeutic at 2.1 after receiving warfarin 7.5 mg yesterday evening. Hgb low-stable at 9.5; Plt WNL. Heparin IV infusion was discontinued this morning. No bleeding noted at this time.    Goal of Therapy:  Monitor platelets by anticoagulation protocol: Yes  INR 2 to 3 Heparin level 0.3-0.5   Plan:  - Warfarin 7.5 mg PO x1 this evening - Monitor INR daily - Monitor for s/sx of bleeding  Agnes Lawrence, PharmD PGY1 Pharmacy Resident    .

## 2019-09-07 NOTE — Progress Notes (Signed)
Patient MEWS score 4 MD.notify. states to give tylenol recheck vitals and notify if no changes. Will continue to monitor.

## 2019-09-07 NOTE — Progress Notes (Signed)
ANTICOAGULATION CONSULT NOTE - Follow Up Consult  Pharmacy Consult for Heparin while INR is <2 Indication: h/o LV thrombus  No Known Allergies  Patient Measurements: Height: 5\' 11"  (180.3 cm) Weight: 157 lb 3 oz (71.3 kg) IBW/kg (Calculated) : 75.3 Heparin Dosing Weight: 71.8 kg  Vital Signs: Temp: 97.9 F (36.6 C) (01/29 2016) Temp Source: Oral (01/29 2016) BP: 135/73 (01/29 2016) Pulse Rate: 89 (01/29 2016)  Labs: Recent Labs    09/04/19 0617 09/04/19 0617 09/05/19 [id number] 09/05/19 0720 09/06/19 0854 09/06/19 2106 09/07/19 0353  HGB 9.8*   < >  --  8.8*  --   --  9.5*  HCT 31.2*  --   --  27.1*  --   --  29.9*  PLT 278  --   --  264  --   --  279  LABPROT 21.0*   < > 22.6*  --  21.1*  --  23.1*  INR 1.8*   < > 2.0*  --  1.8*  --  2.1*  HEPARINUNFRC 0.49   < > 0.33  --   --  0.13* 0.20*  CREATININE 6.73*  --  7.88*  --  6.54*  --   --    < > = values in this interval not displayed.    Estimated Creatinine Clearance: 11.2 mL/min (A) (by C-G formula based on SCr of 6.54 mg/dL (H)).  Assessment:  Patient is a 69 year old male with history of renal transplant in 05/2014 and now with ESRD on HD TTS. Pt had been on apixaban for left ventricular thombus, which has been switched to warfarin. Patient was placed on heparin bridge so that the [facility] can be removed. However, patient's AVF was not able to be cannulated on 1/14; therefore, plans have changed to keep the [facility] in for now. Pharmacy has been consulted to resume warfarin dosing and dose heparin. Of note, patient has also had a recent stroke.  1/30 AM update:  INR is 2.1, will DC heparin  Goal of Therapy:  INR 2-3 Monitor platelets by anticoagulation protocol: Yes    Plan:  Will DC heparin with INR >2  Narda Bonds, PharmD, BCPS Clinical Pharmacist Phone: [phone]

## 2019-09-07 NOTE — Progress Notes (Signed)
PROGRESS NOTE    Jonathan Schoessow Sr.  [id number] DOB: 1951/03/15 DOA: 07/30/2019 PCP: Marton Redwood, MD     Brief Narrative:  Jonathan Krane Sr. 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 MRI brain showed small infarct in the left pons left occiput. Neurology saw the patient and felt this was secondary to left ventricular thrombus; he was started on heparin. He was diagnosed with Covid 07/24/2019. Patient has recurrently tested positive but is asymptomatic and this is probably positivity from his index infection in December.  New events last 24 hours / Subjective: Doing well this morning without complaint.   Assessment & Plan:   Principal Problem:   Acute respiratory failure due to COVID-19 [facility]) Active Problems:   Chronic hepatitis C without hepatic coma ([name])   Diabetes mellitus type 2, uncomplicated ([place])   History of coronary artery disease   Hypertension   ESRD (end stage renal disease) on dialysis ([place])   Acute respiratory failure with hypoxia ([name])   Cerebral thrombosis with cerebral infarction   Cerebral embolism with cerebral infarction   COVID-19 pneumonia -Initially tested positive in December 2020 and completed appropriate treatment.  No longer infectious and off of precautions at this point.  Assumed that patient would continue to test positive for 90 days from his initial positive test  Acute strokes left pons left occipital -Etiology unclear but though to be secondary to Covid versus LV thrombus -Continue ASA, lipitor, coumadin  -Appreciate neurology/stroke team -SNF placement recommended  ESRD/HD TTSa  -Nephrology following  LV thrombus -Coumadin per pharmacy.  INR 1.8 today  Prior cadaveric renal transplant -Continue  prednisone  Hyperglycemia, Pre-diabetes -Continue NovoLog  HTN -Continue toprol    DVT prophylaxis: Coumadin Code Status: Full Family Communication: None at bedside  Disposition Plan: Patient presented from home and currently PT recommendation is for SNF.  Discharge barrier is due to finding SNF placement as patient continues to test positive for Covid infection which was treated in December.   Consultants:   Neurology  Nephrology   Antimicrobials:  Anti-infectives (From admission, onward)   Start     Dose/Rate Route Frequency Ordered Stop   07/31/19 1000  remdesivir 100 mg in sodium chloride 0.9 % 100 mL IVPB     100 mg 200 mL/hr over 30 Minutes Intravenous Daily 07/30/19 2217 08/04/19 0748   07/31/19 0800  ceFEPIme (MAXIPIME) 1 g in sodium chloride 0.9 % 100 mL IVPB  Status:  Discontinued     1 g 200 mL/hr over 30 Minutes Intravenous Every 24 hours 07/31/19 0748 07/31/19 0816   07/31/19 0800  vancomycin (VANCOREADY) IVPB 1500 mg/300 mL  Status:  Discontinued     1,500 mg 150 mL/hr over 120 Minutes Intravenous  Once 07/31/19 0748 07/31/19 0816   07/31/19 0748  vancomycin variable dose per unstable renal function (pharmacist dosing)  Status:  Discontinued      Does not apply See admin instructions 07/31/19 0748 07/31/19 0816   07/30/19 2230  remdesivir 200 mg in sodium chloride 0.9% 250 mL IVPB     200 mg 580 mL/hr over 30 Minutes Intravenous Once 07/30/19 2217 07/31/19 0045       Objective: Vitals:   09/06/19 1624 09/06/19 2016 09/06/19 2016 09/07/19 0539  BP: (!) 152/73  135/73 138/74  Pulse:  89  89 91  Resp: 17  18 18   Temp: 98.1 F (36.7 C) 97.9 F (36.6 C)  100.2 F (37.9 C)  TempSrc: Oral Oral  Oral  SpO2: 95%  100% 96%  Weight:  71.3 kg    Height:        Intake/Output Summary (Last 24 hours) at 09/07/2019 1102 Last data filed at 09/07/2019 0600 Gross per 24 hour  Intake 511.2 ml  Output 250 ml  Net 261.2 ml   Filed Weights   09/05/19 0705  09/05/19 1019 09/06/19 2016  Weight: 70.7 kg 68.9 kg 71.3 kg    Examination: General exam: Appears calm and comfortable  Respiratory system: Clear to auscultation. Respiratory effort normal. Cardiovascular system: S1 & S2 heard, RRR. No pedal edema. Gastrointestinal system: Abdomen is nondistended, soft and nontender. Normal bowel sounds heard. Central nervous system: Alert  Extremities: Symmetric in appearance bilaterally  Skin: No rashes, lesions or ulcers on exposed skin  Psychiatry: Judgement and insight appear stable. Mood & affect appropriate.    Data Reviewed: I have personally reviewed following labs and imaging studies  CBC: Recent Labs  Lab 09/02/19 0625 09/03/19 0500 09/04/19 0617 09/05/19 0720 09/07/19 0353  WBC 10.6* 10.1 10.3 10.6* 10.2  HGB 8.5* 9.3* 9.8* 8.8* 9.5*  HCT 27.2* 29.4* 31.2* 27.1* 29.9*  MCV 107.1* 106.1* 107.2* 104.6* 103.1*  PLT 273 266 278 264 [id number]   Basic Metabolic Panel: Recent Labs  Lab 09/01/19 0521 09/01/19 0521 09/02/19 0625 09/02/19 0625 09/03/19 0500 09/04/19 0617 09/05/19 0412 09/06/19 0854 09/07/19 0353  NA 136   < > 133*   < > 134* 134* 133* 134* 131*  K 4.7   < > 4.8   < > 5.6* 5.1 5.1 4.7 5.1  CL 101   < > 98   < > 102 97* 98 96* 94*  CO2 25   < > 23   < > 23 27 23 27 25   GLUCOSE 191*   < > 207*   < > 155* 141* 147* 213* 132*  BUN 29*   < > 44*   < > 52* 35* 49* 41* 52*  CREATININE 5.43*   < > 7.23*   < > 8.46* 6.73* 7.88* 6.54* 7.67*  CALCIUM 8.1*   < > 8.2*   < > 8.6* 8.8* 8.3* 8.5* 8.5*  MG 1.8  --  1.7  --  2.1 1.9  --   --   --   PHOS 2.7   < > 2.5   < > 3.1 4.3 3.8 3.9 2.9   < > = values in this interval not displayed.   GFR: Estimated Creatinine Clearance: 9.6 mL/min (A) (by C-G formula based on SCr of 7.67 mg/dL (H)). Liver Function Tests: Recent Labs  Lab 09/03/19 0500 09/04/19 0617 09/05/19 0412 09/06/19 0854 09/07/19 0353  ALBUMIN 2.3* 2.4* 2.3* 2.4* 2.3*   No results for input(s): LIPASE, AMYLASE  in the last 168 hours. No results for input(s): AMMONIA in the last 168 hours. Coagulation Profile: Recent Labs  Lab 09/03/19 0500 09/04/19 0617 09/05/19 0412 09/06/19 0854 09/07/19 0353  INR 1.9* 1.8* 2.0* 1.8* 2.1*   Cardiac Enzymes: No results for input(s): CKTOTAL, CKMB, CKMBINDEX, TROPONINI in the last 168 hours. BNP (last 3 results) No results for input(s): PROBNP in the last 8760 hours. HbA1C: No results for input(s): HGBA1C in the last 72 hours. CBG: Recent Labs  Lab 09/05/19 2021 09/06/19 0655 09/06/19 1118 09/06/19 1622 09/07/19 0700  GLUCAP 267* 151* 169* 270* 152*   Lipid Profile: No results for input(s): CHOL, HDL, LDLCALC, TRIG, CHOLHDL, LDLDIRECT in the last 72 hours. Thyroid Function Tests: No results for input(s): TSH, T4TOTAL, FREET4, T3FREE, THYROIDAB in the last 72 hours. Anemia Panel: No results for input(s): VITAMINB12, FOLATE, FERRITIN, TIBC, IRON, RETICCTPCT in the last 72 hours. Sepsis Labs: No results for input(s): PROCALCITON, LATICACIDVEN in the last 168 hours.  No results found for this or any previous visit (from the past 240 hour(s)).    Radiology Studies: No results found.    Scheduled Meds: .  stroke: mapping our early stages of recovery book   Does not apply Once  . allopurinol  100 mg Oral Daily  . aspirin  81 mg Oral Daily  . atorvastatin  40 mg Oral Daily  . Chlorhexidine Gluconate Cloth  6 each Topical Q0600  . Chlorhexidine Gluconate Cloth  6 each Topical Q0600  . darbepoetin (ARANESP) injection - DIALYSIS  150 mcg Intravenous Q Tue-HD  . feeding supplement (ENSURE ENLIVE)  237 mL Oral TID BM  . feeding supplement (PRO-STAT SUGAR FREE 64)  30 mL Oral BID  . heparin  6,000 Units Dialysis Once in dialysis  . insulin aspart  0-6 Units Subcutaneous TID WC  . insulin aspart  2 Units Subcutaneous TID WC  . metoCLOPramide  5 mg Oral BID WC  . metoprolol succinate  25 mg Oral Daily  . multivitamin  1 tablet Oral QHS  .  pantoprazole  40 mg Oral Daily  . predniSONE  5 mg Oral Q breakfast  . sodium chloride flush  3 mL Intravenous Q12H  . warfarin  7.5 mg Oral ONCE-1800  . Warfarin - Pharmacist Dosing Inpatient   Does not apply q1800   Continuous Infusions: . sodium chloride    . sodium chloride       LOS: 39 days      Time spent: 20 minutes   Dessa Phi, DO [facility] 09/07/2019, 11:02 AM   Available via Epic secure chat 7am-7pm After these hours, please refer to coverage provider listed on [email or web address]

## 2019-09-08 LAB — RENAL FUNCTION PANEL
Albumin: 2.3 g/dL — ABNORMAL LOW (ref 3.5–5.0)
Anion gap: 12 (ref 5–15)
BUN: 25 mg/dL — ABNORMAL HIGH (ref 8–23)
CO2: 27 mmol/L (ref 22–32)
Calcium: 8.3 mg/dL — ABNORMAL LOW (ref 8.9–10.3)
Chloride: 99 mmol/L (ref 98–111)
Creatinine, Ser: 5.54 mg/dL — ABNORMAL HIGH (ref 0.61–1.24)
GFR calc Af Amer: 11 mL/min — ABNORMAL LOW (ref >60)
GFR calc non Af Amer: 10 mL/min — ABNORMAL LOW (ref >60)
Glucose, Bld: 108 mg/dL — ABNORMAL HIGH (ref 70–99)
Phosphorus: 3.2 mg/dL (ref 2.5–4.6)
Potassium: 4.8 mmol/L (ref 3.5–5.1)
Sodium: 138 mmol/L (ref 135–145)

## 2019-09-08 LAB — CBC
HCT: 32.6 % — ABNORMAL LOW (ref 39.0–52.0)
Hemoglobin: 10.1 g/dL — ABNORMAL LOW (ref 13.0–17.0)
MCH: 33.2 pg (ref 26.0–34.0)
MCHC: 31 g/dL (ref 30.0–36.0)
MCV: 107.2 fL — ABNORMAL HIGH (ref 80.0–100.0)
Platelets: 279 10*3/uL (ref 150–400)
RBC: 3.04 MIL/uL — ABNORMAL LOW (ref 4.22–5.81)
RDW: 18.5 % — ABNORMAL HIGH (ref 11.5–15.5)
WBC: 8.8 10*3/uL (ref 4.0–10.5)
nRBC: 0 % (ref 0.0–0.2)

## 2019-09-08 LAB — GLUCOSE, CAPILLARY
Glucose-Capillary: 116 mg/dL — ABNORMAL HIGH (ref 70–99)
Glucose-Capillary: 198 mg/dL — ABNORMAL HIGH (ref 70–99)
Glucose-Capillary: 244 mg/dL — ABNORMAL HIGH (ref 70–99)
Glucose-Capillary: 164 mg/dL — ABNORMAL HIGH (ref 70–99)

## 2019-09-08 LAB — PROTIME-INR
INR: 2.3 — ABNORMAL HIGH (ref 0.8–1.2)
Prothrombin Time: 25.6 seconds — ABNORMAL HIGH (ref 11.4–15.2)

## 2019-09-08 MED ORDER — WARFARIN SODIUM 6 MG PO TABS
6.0000 mg | ORAL_TABLET | Freq: Once | ORAL | Status: AC
  Administered 2019-09-08: 6 mg via ORAL
  Filled 2019-09-08: qty 1

## 2019-09-08 NOTE — Progress Notes (Signed)
[facility] Progress Note   Dialysis Orders: TTSG/O -[facility] when COVID 19 negative 4 hr 15 min 180NRE 400/800 80.5 kg 2.0 K/ 2.0 Ca R AVF/RIJ TDC -Heparin 8000 units IV TIW -Mircera 75 mcg IV q 2 weeks (last dose 07/25/19) -Venofer 50 mg IV weekly -Parsabiv 7.5 mg IV TIW  Assessment/Plan: 1.Acute COVID-19 pneumonia - s/p remdesivir &Decadron. Per primary. Now out of 21 day isolation window but still required to have 2 negative COVID 19 test before he can return to [facility]- last + 1/14. Not retested since. Pt can dialyze at [facility] until COVID tests negative.  2.ESRD- on HD TTS while COVID +.TDC removed 1/26. - Cr and K not decreasing much between treatments - be sure he is at max BFR and if time needs to be shortened - it shouldn't be < 3.5 - next HD Tuesday 3. Anemiaof CKD-Hgb8.8 1/28> 9.5> 10.1 (Sun) No acute blood loss.Continue Aranesp to 165mcg qwk. Transfuse prn.tsat 34%. 4. Secondary hyperparathyroidism-Ca/P ok. No binders or VDRA. No parsabiv because not on formulary.  5.HTN/volume-  Does not appear volume overloaded, titrate down volume as tolerated Well below EDW, will need to lower at d/c~70-71 kg net UF 1 L Saturday post wt 70.3 - had BP drop on HD yesterday into 80s  6. Nutrition- Albumin low. Dys 3 diet   Continue renal vit/prostat.  7.LV thrombus-Heparin bridging tocoumadin. Per primary INR 2.3 8.Acute CVA/ hx of LV thrombus (from prior admit)- etiology unclear. Per primary  Disposition: SNF placement when bed available- having placement difficulties.  Myriam Jacobson, PA-C [facility] Beeper [phone] 09/08/2019,9:25 AM  LOS: 40 days   Subjective:   No c/o. Ate good breakfast but cannot recall what he at.  "Just stuff."  Objective Vitals:   09/07/19 1700 09/07/19 2141 09/08/19 0519 09/08/19 0856  BP:  (!) 142/66 132/65 (!) 145/76  Pulse: (!) 108 92 97 89  Resp:  18 17 18   Temp: 99.5 F (37.5 C) 98.9 F  (37.2 C) 98.2 F (36.8 C) 97.9 F (36.6 C)  TempSrc: Oral Oral  Oral  SpO2: 94% 93% 95% 99%  Weight:      Height:       Physical Exam General: lying in bed supine breathing easily on HD room air Heart: RRR Lungs: grossly clear Abdomen: soft NT Extremities: no LE edema Dialysis Access: right upper AVF + bruit  Additional Objective Labs: Lab Results  Component Value Date   INR 2.3 (H) 09/08/2019   INR 2.1 (H) 09/07/2019   INR 1.8 (H) [id number]   Basic Metabolic Panel: Recent Labs  Lab 09/06/19 0854 09/07/19 0353 09/08/19 0543  NA 134* 131* 138  K 4.7 5.1 4.8  CL 96* 94* 99  CO2 27 25 27   GLUCOSE 213* 132* 108*  BUN 41* 52* 25*  CREATININE 6.54* 7.67* 5.54*  CALCIUM 8.5* 8.5* 8.3*  PHOS 3.9 2.9 3.2   Liver Function Tests: Recent Labs  Lab 09/06/19 0854 09/07/19 0353 09/08/19 0543  ALBUMIN 2.4* 2.3* 2.3*   No results for input(s): LIPASE, AMYLASE in the last 168 hours. CBC: Recent Labs  Lab 09/03/19 0500 09/03/19 0500 09/04/19 0617 09/04/19 0617 09/05/19 0720 09/07/19 0353 09/08/19 0543  WBC 10.1   < > 10.3   < > 10.6* 10.2 8.8  HGB 9.3*   < > 9.8*   < > 8.8* 9.5* 10.1*  HCT 29.4*   < > 31.2*   < > 27.1* 29.9* 32.6*  MCV 106.1*  --  107.2*  --  104.6* 103.1* 107.2*  PLT 266   < > 278   < > 264 279 279   < > = values in this interval not displayed.   Blood Culture    Component Value Date/Time   SDES BLOOD SITE NOT SPECIFIED 07/30/2019 1843   SPECREQUEST  07/30/2019 1843    BOTTLES DRAWN AEROBIC AND ANAEROBIC Blood Culture results may not be optimal due to an inadequate volume of blood received in culture bottles   CULT  07/30/2019 1843    NO GROWTH 5 DAYS Performed at [facility], [place] [address] [place], [place] [address]    REPTSTATUS 08/04/2019 FINAL 07/30/2019 1843    Cardiac Enzymes: No results for input(s): CKTOTAL, CKMB, CKMBINDEX, TROPONINI in the last 168 hours. CBG: Recent Labs  Lab 09/07/19 0700 09/07/19 1315  09/07/19 1615 09/07/19 2137 09/08/19 0651  GLUCAP 152* 127* 205* 174* 116*   Iron Studies: No results for input(s): IRON, TIBC, TRANSFERRIN, FERRITIN in the last 72 hours. Lab Results  Component Value Date   INR 2.3 (H) 09/08/2019   INR 2.1 (H) 09/07/2019   INR 1.8 (H) 09/06/2019   Studies/Results: No results found. Medications:  .  stroke: mapping our early stages of recovery book   Does not apply Once  . allopurinol  100 mg Oral Daily  . aspirin  81 mg Oral Daily  . atorvastatin  40 mg Oral Daily  . Chlorhexidine Gluconate Cloth  6 each Topical Q0600  . Chlorhexidine Gluconate Cloth  6 each Topical Q0600  . darbepoetin (ARANESP) injection - DIALYSIS  150 mcg Intravenous Q Tue-HD  . feeding supplement (ENSURE ENLIVE)  237 mL Oral TID BM  . feeding supplement (PRO-STAT SUGAR FREE 64)  30 mL Oral BID  . insulin aspart  0-6 Units Subcutaneous TID WC  . insulin aspart  2 Units Subcutaneous TID WC  . metoCLOPramide  5 mg Oral BID WC  . metoprolol succinate  25 mg Oral Daily  . multivitamin  1 tablet Oral QHS  . pantoprazole  40 mg Oral Daily  . predniSONE  5 mg Oral Q breakfast  . sodium chloride flush  3 mL Intravenous Q12H  . warfarin  6 mg Oral ONCE-1800  . Warfarin - Pharmacist Dosing Inpatient   Does not apply [phone]

## 2019-09-08 NOTE — Progress Notes (Addendum)
ANTICOAGULATION CONSULT NOTE - Follow Up Consult  Pharmacy Consult for Heparin Indication: h/o LV thrombus  No Known Allergies  Patient Measurements: Height: 5\' 11"  (180.3 cm) Weight: 154 lb 15.7 oz (70.3 kg) IBW/kg (Calculated) : 75.3 Heparin Dosing Weight: 71.8 kg  Vital Signs: Temp: 98.2 F (36.8 C) (01/31 0519) Temp Source: Oral (01/30 2141) BP: 132/65 (01/31 0519) Pulse Rate: 97 (01/31 0519)  Labs: Recent Labs    09/06/19 0854 09/06/19 2106 09/07/19 0353 09/08/19 0543  HGB  --   --  9.5*  --   HCT  --   --  29.9*  --   PLT  --   --  279  --   LABPROT 21.1*  --  23.1* 25.6*  INR 1.8*  --  2.1* 2.3*  HEPARINUNFRC  --  0.13* 0.20*  --   CREATININE 6.54*  --  7.67* 5.54*    Estimated Creatinine Clearance: 13 mL/min (A) (by C-G formula based on SCr of 5.54 mg/dL (H)).  Assessment:  Patient is a 69 year old male with history of renal transplant in 05/2014 and now with ESRD on HD TTS. Pt had been on apixaban for left ventricular thombus, which has been switched to warfarin. Patient was placed on heparin bridge so that the [facility] can be removed. TDC removed 1/26. Pharmacy has been consulted to resume warfarin dosing. Of note, patient has also had a recent stroke.  This morning, INR was therapeutic at 2.3 after receiving warfarin 7.5 mg yesterday evening. Hgb low-stable at 10.1; Plt WNL. No bleeding noted at this time.    Goal of Therapy:  Monitor platelets by anticoagulation protocol: Yes  INR 2 to 3   Plan:  - Warfarin 6 mg PO x1 this evening - Monitor INR daily - Monitor for s/sx of bleeding  Agnes Lawrence, PharmD PGY1 Pharmacy Resident    .

## 2019-09-08 NOTE — Progress Notes (Signed)
PROGRESS NOTE    Jonathan Boulos Sr.  [id number] DOB: [birth year] DOA: 07/30/2019 PCP: Marton Redwood, MD     Brief Narrative:  Jonathan Yokley Sr. 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 MRI brain showed small infarct in the left pons left occiput. Neurology saw the patient and felt this was secondary to left ventricular thrombus; he was started on heparin. He was diagnosed with Covid 07/24/2019. Patient has recurrently tested positive but is asymptomatic and this is probably positivity from his index infection in December.  New events last 24 hours / Subjective: Had a fever yesterday evening 101.9 without complaint. Patient denies any chest pain, SOB, cough, N/V/D, abdominal pain, dysuria on exam. States he is feeling well.   Assessment & Plan:   Principal Problem:   Acute respiratory failure due to COVID-19 [facility]) Active Problems:   Chronic hepatitis C without hepatic coma ([name])   Diabetes mellitus type 2, uncomplicated ([place])   History of coronary artery disease   Hypertension   ESRD (end stage renal disease) on dialysis ([place])   Acute respiratory failure with hypoxia ([name])   Cerebral thrombosis with cerebral infarction   Cerebral embolism with cerebral infarction   COVID-19 pneumonia -Initially tested positive in December 2020 and completed appropriate treatment.  No longer infectious and off of precautions at this point.  Assumed that patient would continue to test positive for 90 days from his initial positive test  Acute strokes left pons left occipital -Etiology unclear but though to be secondary to Covid versus LV thrombus -Continue ASA, lipitor, coumadin  -Appreciate neurology/stroke team -SNF placement recommended  ESRD/HD TTSa  -Nephrology  following  LV thrombus -Coumadin per pharmacy  Prior cadaveric renal transplant -Continue prednisone  Hyperglycemia, Pre-diabetes -Continue NovoLog  HTN -Continue toprol   Fever -No etiology of infection found -WBC normal  -Blood culture ordered -Monitor fever curve    DVT prophylaxis: Coumadin Code Status: Full Family Communication: None at bedside  Disposition Plan: Patient presented from home and currently PT recommendation is for SNF.  Discharge barrier is due to finding SNF placement as patient continues to test positive for Covid infection which was treated in December.   Consultants:   Neurology  Nephrology   Antimicrobials:  Anti-infectives (From admission, onward)   Start     Dose/Rate Route Frequency Ordered Stop   07/31/19 1000  remdesivir 100 mg in sodium chloride 0.9 % 100 mL IVPB     100 mg 200 mL/hr over 30 Minutes Intravenous Daily 07/30/19 2217 08/04/19 0748   07/31/19 0800  ceFEPIme (MAXIPIME) 1 g in sodium chloride 0.9 % 100 mL IVPB  Status:  Discontinued     1 g 200 mL/hr over 30 Minutes Intravenous Every 24 hours 07/31/19 0748 07/31/19 0816   07/31/19 0800  vancomycin (VANCOREADY) IVPB 1500 mg/300 mL  Status:  Discontinued     1,500 mg 150 mL/hr over 120 Minutes Intravenous  Once 07/31/19 0748 07/31/19 0816   07/31/19 0748  vancomycin variable dose per unstable renal function (pharmacist dosing)  Status:  Discontinued      Does not apply See admin instructions 07/31/19 0748 07/31/19 0816   07/30/19 2230  remdesivir 200 mg in sodium chloride 0.9% 250 mL IVPB     200 mg 580 mL/hr over 30 Minutes  Intravenous Once 07/30/19 2217 07/31/19 0045       Objective: Vitals:   09/07/19 1700 09/07/19 2141 09/08/19 0519 09/08/19 0856  BP:  (!) 142/66 132/65 (!) 145/76  Pulse: (!) 108 92 97 89  Resp:  18 17 18   Temp: 99.5 F (37.5 C) 98.9 F (37.2 C) 98.2 F (36.8 C) 97.9 F (36.6 C)  TempSrc: Oral Oral  Oral  SpO2: 94% 93% 95% 99%  Weight:       Height:        Intake/Output Summary (Last 24 hours) at 09/08/2019 0958 Last data filed at 09/08/2019 0600 Gross per 24 hour  Intake 660 ml  Output 1000 ml  Net -340 ml   Filed Weights   09/06/19 2016 09/07/19 0830 09/07/19 1222  Weight: 71.3 kg 71.3 kg 70.3 kg     Examination: General exam: Appears calm and comfortable  Respiratory system: Clear to auscultation. Respiratory effort normal. Cardiovascular system: S1 & S2 heard, RRR. No pedal edema. Gastrointestinal system: Abdomen is nondistended, soft and nontender. Normal bowel sounds heard. Central nervous system: Alert and oriented. Non focal exam. Speech clear  Extremities: Symmetric in appearance bilaterally  Skin: No rashes, lesions or ulcers on exposed skin  Psychiatry: Judgement and insight appear stable. Mood & affect appropriate.     Data Reviewed: I have personally reviewed following labs and imaging studies  CBC: Recent Labs  Lab 09/03/19 0500 09/04/19 0617 09/05/19 0720 09/07/19 0353 09/08/19 0543  WBC 10.1 10.3 10.6* 10.2 8.8  HGB 9.3* 9.8* 8.8* 9.5* 10.1*  HCT 29.4* 31.2* 27.1* 29.9* 32.6*  MCV 106.1* 107.2* 104.6* 103.1* 107.2*  PLT 266 278 264 279 [id number]   Basic Metabolic Panel: Recent Labs  Lab 09/02/19 0625 09/02/19 0625 09/03/19 0500 09/03/19 0500 09/04/19 0617 09/05/19 0412 09/06/19 0854 09/07/19 0353 09/08/19 0543  NA 133*   < > 134*   < > 134* 133* 134* 131* 138  K 4.8   < > 5.6*   < > 5.1 5.1 4.7 5.1 4.8  CL 98   < > 102   < > 97* 98 96* 94* 99  CO2 23   < > 23   < > 27 23 27 25 27   GLUCOSE 207*   < > 155*   < > 141* 147* 213* 132* 108*  BUN 44*   < > 52*   < > 35* 49* 41* 52* 25*  CREATININE 7.23*   < > 8.46*   < > 6.73* 7.88* 6.54* 7.67* 5.54*  CALCIUM 8.2*   < > 8.6*   < > 8.8* 8.3* 8.5* 8.5* 8.3*  MG 1.7  --  2.1  --  1.9  --   --   --   --   PHOS 2.5   < > 3.1   < > 4.3 3.8 3.9 2.9 3.2   < > = values in this interval not displayed.   GFR: Estimated Creatinine Clearance:  13 mL/min (A) (by C-G formula based on SCr of 5.54 mg/dL (H)). Liver Function Tests: Recent Labs  Lab 09/04/19 0617 09/05/19 0412 09/06/19 0854 09/07/19 0353 09/08/19 0543  ALBUMIN 2.4* 2.3* 2.4* 2.3* 2.3*   No results for input(s): LIPASE, AMYLASE in the last 168 hours. No results for input(s): AMMONIA in the last 168 hours. Coagulation Profile: Recent Labs  Lab 09/04/19 0617 09/05/19 0412 09/06/19 0854 09/07/19 0353 09/08/19 0543  INR 1.8* 2.0* 1.8* 2.1* 2.3*   Cardiac Enzymes: No results for input(s): CKTOTAL,  CKMB, CKMBINDEX, TROPONINI in the last 168 hours. BNP (last 3 results) No results for input(s): PROBNP in the last 8760 hours. HbA1C: No results for input(s): HGBA1C in the last 72 hours. CBG: Recent Labs  Lab 09/07/19 0700 09/07/19 1315 09/07/19 1615 09/07/19 2137 09/08/19 0651  GLUCAP 152* 127* 205* 174* 116*   Lipid Profile: No results for input(s): CHOL, HDL, LDLCALC, TRIG, CHOLHDL, LDLDIRECT in the last 72 hours. Thyroid Function Tests: No results for input(s): TSH, T4TOTAL, FREET4, T3FREE, THYROIDAB in the last 72 hours. Anemia Panel: No results for input(s): VITAMINB12, FOLATE, FERRITIN, TIBC, IRON, RETICCTPCT in the last 72 hours. Sepsis Labs: No results for input(s): PROCALCITON, LATICACIDVEN in the last 168 hours.  No results found for this or any previous visit (from the past 240 hour(s)).    Radiology Studies: No results found.    Scheduled Meds: .  stroke: mapping our early stages of recovery book   Does not apply Once  . allopurinol  100 mg Oral Daily  . aspirin  81 mg Oral Daily  . atorvastatin  40 mg Oral Daily  . Chlorhexidine Gluconate Cloth  6 each Topical Q0600  . Chlorhexidine Gluconate Cloth  6 each Topical Q0600  . darbepoetin (ARANESP) injection - DIALYSIS  150 mcg Intravenous Q Tue-HD  . feeding supplement (ENSURE ENLIVE)  237 mL Oral TID BM  . feeding supplement (PRO-STAT SUGAR FREE 64)  30 mL Oral BID  . insulin  aspart  0-6 Units Subcutaneous TID WC  . insulin aspart  2 Units Subcutaneous TID WC  . metoCLOPramide  5 mg Oral BID WC  . metoprolol succinate  25 mg Oral Daily  . multivitamin  1 tablet Oral QHS  . pantoprazole  40 mg Oral Daily  . predniSONE  5 mg Oral Q breakfast  . sodium chloride flush  3 mL Intravenous Q12H  . warfarin  6 mg Oral ONCE-1800  . Warfarin - Pharmacist Dosing Inpatient   Does not apply q1800   Continuous Infusions:    LOS: 40 days      Time spent: 25 minutes   Dessa Phi, DO [facility] 09/08/2019, 9:58 AM   Available via Epic secure chat 7am-7pm After these hours, please refer to coverage provider listed on [email or web address]

## 2019-09-09 LAB — RENAL FUNCTION PANEL
Albumin: 2.6 g/dL — ABNORMAL LOW (ref 3.5–5.0)
Anion gap: 15 (ref 5–15)
BUN: 42 mg/dL — ABNORMAL HIGH (ref 8–23)
CO2: 25 mmol/L (ref 22–32)
Calcium: 8.9 mg/dL (ref 8.9–10.3)
Chloride: 99 mmol/L (ref 98–111)
Creatinine, Ser: 7.5 mg/dL — ABNORMAL HIGH (ref 0.61–1.24)
GFR calc Af Amer: 8 mL/min — ABNORMAL LOW (ref >60)
GFR calc non Af Amer: 7 mL/min — ABNORMAL LOW (ref >60)
Glucose, Bld: 163 mg/dL — ABNORMAL HIGH (ref 70–99)
Phosphorus: 3.7 mg/dL (ref 2.5–4.6)
Potassium: 5.3 mmol/L — ABNORMAL HIGH (ref 3.5–5.1)
Sodium: 139 mmol/L (ref 135–145)

## 2019-09-09 LAB — CBC
HCT: 34.9 % — ABNORMAL LOW (ref 39.0–52.0)
Hemoglobin: 10.8 g/dL — ABNORMAL LOW (ref 13.0–17.0)
MCH: 32.5 pg (ref 26.0–34.0)
MCHC: 30.9 g/dL (ref 30.0–36.0)
MCV: 105.1 fL — ABNORMAL HIGH (ref 80.0–100.0)
Platelets: 305 10*3/uL (ref 150–400)
RBC: 3.32 MIL/uL — ABNORMAL LOW (ref 4.22–5.81)
RDW: 17.7 % — ABNORMAL HIGH (ref 11.5–15.5)
WBC: 10.1 10*3/uL (ref 4.0–10.5)
nRBC: 0 % (ref 0.0–0.2)

## 2019-09-09 LAB — PROTIME-INR
INR: 2.5 — ABNORMAL HIGH (ref 0.8–1.2)
Prothrombin Time: 27.3 seconds — ABNORMAL HIGH (ref 11.4–15.2)

## 2019-09-09 LAB — GLUCOSE, CAPILLARY
Glucose-Capillary: 170 mg/dL — ABNORMAL HIGH (ref 70–99)
Glucose-Capillary: 273 mg/dL — ABNORMAL HIGH (ref 70–99)
Glucose-Capillary: 226 mg/dL — ABNORMAL HIGH (ref 70–99)
Glucose-Capillary: 314 mg/dL — ABNORMAL HIGH (ref 70–99)

## 2019-09-09 MED ORDER — WARFARIN SODIUM 6 MG PO TABS
6.0000 mg | ORAL_TABLET | Freq: Once | ORAL | Status: AC
  Administered 2019-09-09: 6 mg via ORAL
  Filled 2019-09-09: qty 1

## 2019-09-09 NOTE — Progress Notes (Signed)
ANTICOAGULATION CONSULT NOTE - Follow Up Consult  Pharmacy Consult for Coumadin Indication: LV thrombus, CVA  No Known Allergies  Patient Measurements: Height: 5\' 11"  (180.3 cm) Weight: 154 lb 15.7 oz (70.3 kg) IBW/kg (Calculated) : 75.3  Vital Signs: Temp: 98.8 F (37.1 C) (02/01 0940) BP: 145/83 (02/01 0940) Pulse Rate: 101 (02/01 0940)  Labs: Recent Labs    09/06/19 2106 09/07/19 0353 09/07/19 0353 09/08/19 0543 09/09/19 0218  HGB  --  9.5*   < > 10.1* 10.8*  HCT  --  29.9*  --  32.6* 34.9*  PLT  --  279  --  279 305  LABPROT  --  23.1*  --  25.6* 27.3*  INR  --  2.1*  --  2.3* 2.5*  HEPARINUNFRC 0.13* 0.20*  --   --   --   CREATININE  --  7.67*  --  5.54* 7.50*   < > = values in this interval not displayed.    Estimated Creatinine Clearance: 9.6 mL/min (A) (by C-G formula based on SCr of 7.5 mg/dL (H)).   Assessment:  Anticoag: Apix per MD> heparin gtt for h/o LV thrombus > warfarin  (also recent stroke noted) - INR 2.5, Hgb 10.8 improving. Plts WNL  Goal of Therapy:  INR 2-3 Monitor platelets by anticoagulation protocol: Yes   Plan:  - Warfarin 6 mg x 1 this evening. Daily INR - HD TTS. Hold Aranesp 2/2 due to elevated Hgb?   [place] Fosdick S. Alford Highland, PharmD, BCPS Clinical Staff Pharmacist [email or web address] Alford Highland, Orlandus Borowski Stillinger 09/09/2019,9:43 AM

## 2019-09-09 NOTE — Progress Notes (Signed)
PROGRESS NOTE    Jonathan Haenel Sr.  [id number] DOB: 18-Jun-1951 DOA: 07/30/2019 PCP: Marton Redwood, MD     Brief Narrative:  Jonathan Dwyer Sr. 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 MRI brain showed small infarct in the left pons left occiput. Neurology saw the patient and felt this was secondary to left ventricular thrombus; he was started on heparin. He was diagnosed with Covid 07/24/2019. Patient has recurrently tested positive but is asymptomatic and this is probably positivity from his index infection in December.  New events last 24 hours / Subjective: No further fevers, eating breakfast without any complaints today.  Assessment & Plan:   Principal Problem:   Acute respiratory failure due to COVID-19 [facility]) Active Problems:   Chronic hepatitis C without hepatic coma ([name])   Diabetes mellitus type 2, uncomplicated ([name])   History of coronary artery disease   Hypertension   ESRD (end stage renal disease) on dialysis ([name])   Acute respiratory failure with hypoxia ([name])   Cerebral thrombosis with cerebral infarction   Cerebral embolism with cerebral infarction   COVID-19 pneumonia -Initially tested positive in December 2020 and completed appropriate treatment.  No longer infectious and off of precautions at this point.  Assumed that patient would continue to test positive for 90 days from his initial positive test  Acute strokes left pons left occipital -Etiology unclear but though to be secondary to Covid versus LV thrombus -Continue ASA, lipitor, coumadin  -Appreciate neurology/stroke team -SNF placement recommended  ESRD/HD TTSa  -Nephrology following  LV thrombus -Coumadin per pharmacy  Prior cadaveric renal transplant -Continue  prednisone  Hyperglycemia, Pre-diabetes -Continue NovoLog  HTN -Continue toprol   Fever -No etiology of infection found -WBC normal  -Blood culture negative to date -Afebrile last 24 hours.  Continue to monitor fever off antibiotics   DVT prophylaxis: Coumadin Code Status: Full Family Communication: None at bedside  Disposition Plan: Patient presented from home and currently PT recommendation is for SNF.  Discharge barrier is due to finding SNF placement as patient continues to test positive for Covid infection which was treated in December.   Consultants:   Neurology  Nephrology   Antimicrobials:  Anti-infectives (From admission, onward)   Start     Dose/Rate Route Frequency Ordered Stop   07/31/19 1000  remdesivir 100 mg in sodium chloride 0.9 % 100 mL IVPB     100 mg 200 mL/hr over 30 Minutes Intravenous Daily 07/30/19 2217 08/04/19 0748   07/31/19 0800  ceFEPIme (MAXIPIME) 1 g in sodium chloride 0.9 % 100 mL IVPB  Status:  Discontinued     1 g 200 mL/hr over 30 Minutes Intravenous Every 24 hours 07/31/19 0748 07/31/19 0816   07/31/19 0800  vancomycin (VANCOREADY) IVPB 1500 mg/300 mL  Status:  Discontinued     1,500 mg 150 mL/hr over 120 Minutes Intravenous  Once 07/31/19 0748 07/31/19 0816   07/31/19 0748  vancomycin variable dose per unstable renal function (pharmacist dosing)  Status:  Discontinued      Does not apply See admin instructions 07/31/19 0748 07/31/19 0816   07/30/19 2230  remdesivir 200 mg in sodium chloride 0.9% 250 mL IVPB     200 mg 580 mL/hr over 30 Minutes Intravenous Once 07/30/19 2217 07/31/19 0045  Objective: Vitals:   09/08/19 2137 09/09/19 0500 09/09/19 0555 09/09/19 0940  BP: 114/66  (!) 128/48 (!) 145/83  Pulse: 89  (!) 110 (!) 101  Resp: 16  18 18   Temp: 98.4 F (36.9 C)  98.6 F (37 C) 98.8 F (37.1 C)  TempSrc:      SpO2: 98%  96% 95%  Weight: 70.3 kg 70.3 kg    Height:        Intake/Output Summary (Last 24  hours) at 09/09/2019 1008 Last data filed at 09/09/2019 0600 Gross per 24 hour  Intake 240 ml  Output 300 ml  Net -60 ml   Filed Weights   09/07/19 1222 09/08/19 2137 09/09/19 0500  Weight: 70.3 kg 70.3 kg 70.3 kg     Examination: General exam: Appears calm and comfortable  Respiratory system: Clear to auscultation. Respiratory effort normal. Cardiovascular system: S1 & S2 heard, RRR. No pedal edema. Gastrointestinal system: Abdomen is nondistended, soft and nontender. Normal bowel sounds heard. Central nervous system: Alert and oriented. Non focal exam. Speech clear  Extremities: Symmetric in appearance bilaterally  Skin: No rashes, lesions or ulcers on exposed skin  Psychiatry: Judgement and insight appear stable. Mood & affect appropriate.     Data Reviewed: I have personally reviewed following labs and imaging studies  CBC: Recent Labs  Lab 09/04/19 0617 09/05/19 0720 09/07/19 0353 09/08/19 0543 09/09/19 0218  WBC 10.3 10.6* 10.2 8.8 10.1  HGB 9.8* 8.8* 9.5* 10.1* 10.8*  HCT 31.2* 27.1* 29.9* 32.6* 34.9*  MCV 107.2* 104.6* 103.1* 107.2* 105.1*  PLT 278 264 279 279 [id number]   Basic Metabolic Panel: Recent Labs  Lab 09/03/19 0500 09/03/19 0500 09/04/19 0617 09/04/19 0617 09/05/19 0412 09/06/19 0854 09/07/19 0353 09/08/19 0543 09/09/19 0218  NA 134*   < > 134*   < > 133* 134* 131* 138 139  K 5.6*   < > 5.1   < > 5.1 4.7 5.1 4.8 5.3*  CL 102   < > 97*   < > 98 96* 94* 99 99  CO2 23   < > 27   < > 23 27 25 27 25   GLUCOSE 155*   < > 141*   < > 147* 213* 132* 108* 163*  BUN 52*   < > 35*   < > 49* 41* 52* 25* 42*  CREATININE 8.46*   < > 6.73*   < > 7.88* 6.54* 7.67* 5.54* 7.50*  CALCIUM 8.6*   < > 8.8*   < > 8.3* 8.5* 8.5* 8.3* 8.9  MG 2.1  --  1.9  --   --   --   --   --   --   PHOS 3.1   < > 4.3   < > 3.8 3.9 2.9 3.2 3.7   < > = values in this interval not displayed.   GFR: Estimated Creatinine Clearance: 9.6 mL/min (A) (by C-G formula based on SCr of 7.5 mg/dL  (H)). Liver Function Tests: Recent Labs  Lab 09/05/19 0412 09/06/19 0854 09/07/19 0353 09/08/19 0543 09/09/19 0218  ALBUMIN 2.3* 2.4* 2.3* 2.3* 2.6*   No results for input(s): LIPASE, AMYLASE in the last 168 hours. No results for input(s): AMMONIA in the last 168 hours. Coagulation Profile: Recent Labs  Lab 09/05/19 0412 09/06/19 0854 09/07/19 0353 09/08/19 0543 09/09/19 0218  INR 2.0* 1.8* 2.1* 2.3* 2.5*   Cardiac Enzymes: No results for input(s): CKTOTAL, CKMB, CKMBINDEX, TROPONINI in the last 168 hours. BNP (last  3 results) No results for input(s): PROBNP in the last 8760 hours. HbA1C: No results for input(s): HGBA1C in the last 72 hours. CBG: Recent Labs  Lab 09/08/19 0651 09/08/19 1119 09/08/19 1608 09/08/19 2137 09/09/19 0724  GLUCAP 116* 198* 244* 164* 170*   Lipid Profile: No results for input(s): CHOL, HDL, LDLCALC, TRIG, CHOLHDL, LDLDIRECT in the last 72 hours. Thyroid Function Tests: No results for input(s): TSH, T4TOTAL, FREET4, T3FREE, THYROIDAB in the last 72 hours. Anemia Panel: No results for input(s): VITAMINB12, FOLATE, FERRITIN, TIBC, IRON, RETICCTPCT in the last 72 hours. Sepsis Labs: No results for input(s): PROCALCITON, LATICACIDVEN in the last 168 hours.  Recent Results (from the past 240 hour(s))  Culture, blood (routine x 2)     Status: None (Preliminary result)   Collection Time: 09/07/19  5:55 PM   Specimen: BLOOD LEFT HAND  Result Value Ref Range Status   Specimen Description BLOOD LEFT HAND  Final   Special Requests   Final    BOTTLES DRAWN AEROBIC AND ANAEROBIC Blood Culture adequate volume   Culture   Final    NO GROWTH < 24 HOURS Performed at [facility], [address], [place], [place] [address]    Report Status PENDING  Incomplete  Culture, blood (routine x 2)     Status: None (Preliminary result)   Collection Time: 09/07/19  6:04 PM   Specimen: BLOOD LEFT HAND  Result Value Ref Range Status   Specimen  Description BLOOD LEFT HAND  Final   Special Requests   Final    BOTTLES DRAWN AEROBIC ONLY Blood Culture adequate volume   Culture   Final    NO GROWTH < 24 HOURS Performed at [facility], [place] [address], [place], [place] [address]    Report Status PENDING  Incomplete      Radiology Studies: No results found.    Scheduled Meds: .  stroke: mapping our early stages of recovery book   Does not apply Once  . allopurinol  100 mg Oral Daily  . aspirin  81 mg Oral Daily  . atorvastatin  40 mg Oral Daily  . Chlorhexidine Gluconate Cloth  6 each Topical Q0600  . Chlorhexidine Gluconate Cloth  6 each Topical Q0600  . darbepoetin (ARANESP) injection - DIALYSIS  150 mcg Intravenous Q Tue-HD  . feeding supplement (ENSURE ENLIVE)  237 mL Oral TID BM  . feeding supplement (PRO-STAT SUGAR FREE 64)  30 mL Oral BID  . insulin aspart  0-6 Units Subcutaneous TID WC  . insulin aspart  2 Units Subcutaneous TID WC  . metoCLOPramide  5 mg Oral BID WC  . metoprolol succinate  25 mg Oral Daily  . multivitamin  1 tablet Oral QHS  . pantoprazole  40 mg Oral Daily  . predniSONE  5 mg Oral Q breakfast  . sodium chloride flush  3 mL Intravenous Q12H  . warfarin  6 mg Oral ONCE-1800  . Warfarin - Pharmacist Dosing Inpatient   Does not apply q1800   Continuous Infusions:    LOS: 41 days      Time spent: 20 minutes   Dessa Phi, DO [facility] 09/09/2019, 10:08 AM   Available via Epic secure chat 7am-7pm After these hours, please refer to coverage provider listed on [email or web address]

## 2019-09-09 NOTE — Progress Notes (Signed)
Physical Therapy Treatment Patient Details Name: Jonathan [name]. MRN: [id number] DOB: 1951/07/08 Today's Date: 09/09/2019    History of Present Illness 69 y.o. 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 Diagnosed with COVID 6 days prior to coming to ED, where chest x-ray showed multifocal pneumonia. Admitted 07/30/19 for treatment of acute encephalopathy, difficulty swallowing and pneumonia.MRI of the brain showed small acute infarcts in the left pons, left occipital lob and right frontal lobe in addition to chronic infarcts     PT Comments    Pt was seen for  Bed mobility and transfer to the recliner, and worked on safety with transitions.  Standing balance is poor+ with RW increasing to fair.  Follow pt with increased frequency to improve his standing and gait given his increased interest in getting stronger and safer.   Follow Up Recommendations  SNF     Equipment Recommendations  None recommended by PT    Recommendations for Other Services       Precautions / Restrictions Precautions Precautions: Fall Restrictions Weight Bearing Restrictions: No    Mobility  Bed Mobility Overal bed mobility: Needs Assistance Bed Mobility: Supine to Sit     Supine to sit: Min assist     General bed mobility comments: cues that increase sitting control, standing static stability  Transfers Overall transfer level: Needs assistance Equipment used: Rolling walker (2 wheeled) Transfers: Sit to/from [facility] Sit to Stand: Min assist;Mod assist Stand pivot transfers: Min assist;Mod assist          Ambulation/Gait                 Stairs             Wheelchair Mobility    Modified Rankin (Stroke Patients Only)       Balance Overall balance assessment: Needs  assistance Sitting-balance support: Feet supported Sitting balance-Leahy Scale: Fair       Standing balance-Leahy Scale: Poor                              Cognition Arousal/Alertness: Awake/alert Behavior During Therapy: Flat affect Overall Cognitive Status: Impaired/Different from baseline Area of Impairment: Memory;Problem solving;Awareness;Following commands;Safety/judgement                 Orientation Level: Situation Current Attention Level: Selective Memory: Decreased short-term memory Following Commands: Follows one step commands inconsistently;Follows one step commands with increased time Safety/Judgement: Decreased awareness of safety;Decreased awareness of deficits Awareness: Intellectual Problem Solving: Slow processing;Requires verbal cues        Exercises      General Comments General comments (skin integrity, edema, etc.): mod to power up and min assist to control balance and cue stesp to chair      Pertinent Vitals/Pain Pain Assessment: No/denies pain    Home Living                      Prior Function            PT Goals (current goals can now be found in the care plan section) Acute Rehab PT Goals Patient Stated Goal: none stated Progress towards PT goals: Progressing toward goals    Frequency    Min 2X/week      PT Plan Frequency needs to  be updated    Co-evaluation              AM-PAC PT "6 Clicks" Mobility   Outcome Measure  Help needed turning from your back to your side while in a flat bed without using bedrails?: A Little Help needed moving from lying on your back to sitting on the side of a flat bed without using bedrails?: A Little Help needed moving to and from a bed to a chair (including a wheelchair)?: A Little Help needed standing up from a chair using your arms (e.g., wheelchair or bedside chair)?: A Lot Help needed to walk in hospital room?: A Little Help needed climbing 3-5 steps with a  railing? : Total 6 Click Score: 15    End of Session Equipment Utilized During Treatment: Gait belt Activity Tolerance: Patient limited by fatigue Patient left: in chair;with call bell/phone within reach;with chair alarm set;with nursing/sitter in room Nurse Communication: Mobility status PT Visit Diagnosis: Unsteadiness on feet (R26.81);Other abnormalities of gait and mobility (R26.89);Muscle weakness (generalized) (M62.81);Ataxic gait (R26.0);Difficulty in walking, not elsewhere classified (R26.2)     Time: [id number] PT Time Calculation (min) (ACUTE ONLY): 17 min  Charges:  $Therapeutic Activity: 8-22 mins                 Ramond Dial 09/09/2019, 10:14 PM   Mee Hives, PT MS Acute Rehab Dept. Number: [employer]

## 2019-09-09 NOTE — Plan of Care (Signed)
  Problem: Self-Care: Goal: Ability to participate in self-care as condition permits will improve Outcome: Progressing   Problem: Nutrition: Goal: Adequate nutrition will be maintained Outcome: Progressing

## 2019-09-09 NOTE — Progress Notes (Signed)
Subjective:  Just finished breakfast , no cos   Objective Vital signs in last 24 hours: Vitals:   09/08/19 1626 09/08/19 2137 09/09/19 0500 09/09/19 0555  BP: (!) 141/77 114/66  (!) 128/48  Pulse: 72 89  (!) 110  Resp: 18 16  18   Temp: 97.8 F (36.6 C) 98.4 F (36.9 C)  98.6 F (37 C)  TempSrc: Oral     SpO2: 97% 98%  96%  Weight:  70.3 kg 70.3 kg   Height:       Weight change: -0.985 kg  Physical Exam General:Alert NAD , chronically ill Male  Heart: RRR, no M,R, G  Lungs: CTA , unlabored  Abdomen: soft NT, ND  Extremities: no LE edema Dialysis Access: right upper AVF + bruit  Dialysis Orders: TTSG/O -[facility] when COVID 19 negative 4 hr 15 min 180NRE 400/800 80.5 kg 2.0 K/ 2.0 Ca R AVF/RIJ TDC -Heparin 8000 units IV TIW -Mircera 75 mcg IV q 2 weeks (last dose 07/25/19) -Venofer 50 mg IV weekly -Parsabiv 7.5 mg IV TIW   Problem/Plan: 1.Acute COVID-19 pneumonia - s/p remdesivir &Decadron. Per primary. Now out of 21 day isolation window but still required to have 2 negative COVID 19 test before he can [facility] to [facility]- last + 1/14. Not retested since. Pt can dialyze at [facility] until COVID tests negative.  2.ESRD- on HD TTS . K 5.3 needs renal diet restrictions/  while COVID  +.TDC removed1/26.  if time needs to be shortened - it shouldn't be < 3.5 - next HD Tuesday 2/2 his labs  3. Anemiaof CKD-Hgb 10.8 .ContinueAranesp to 115mcg qwk. Transfuse prn.tsat 34%. 4. Secondary hyperparathyroidism-Ca/P ok. No binders or VDRA. No parsabiv because not on formulary.  5.HTN/volume-  vol ok, tWell below EDW, will need to lower at d/c~70-71 kg net UF 1 L Saturday post wt 70.3 - had BP drop on last HD  into 80s  6. Nutrition- Albumin low 2.6 . Dys 3 diet   Continue renal vit/prostat.  7.LV thrombus-Heparin bridging tocoumadin. 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 Per primary 9. Disposition: SNF placement when  bed available- having placement difficulties.   Ernest Haber, PA-C [facility] [phone] 09/09/2019,9:25 AM  LOS: 41 days   Labs: Basic Metabolic Panel: Recent Labs  Lab 09/07/19 0353 09/08/19 0543 09/09/19 0218  NA 131* 138 139  K 5.1 4.8 5.3*  CL 94* 99 99  CO2 25 27 25   GLUCOSE 132* 108* 163*  BUN 52* 25* 42*  CREATININE 7.67* 5.54* 7.50*  CALCIUM 8.5* 8.3* 8.9  PHOS 2.9 3.2 3.7   Liver Function Tests: Recent Labs  Lab 09/07/19 0353 09/08/19 0543 09/09/19 0218  ALBUMIN 2.3* 2.3* 2.6*   No results for input(s): LIPASE, AMYLASE in the last 168 hours. No results for input(s): AMMONIA in the last 168 hours. CBC: Recent Labs  Lab 09/04/19 0617 09/04/19 0617 09/05/19 0720 09/05/19 0720 09/07/19 0353 09/08/19 0543 09/09/19 0218  WBC 10.3   < > 10.6*   < > 10.2 8.8 10.1  HGB 9.8*   < > 8.8*   < > 9.5* 10.1* 10.8*  HCT 31.2*   < > 27.1*   < > 29.9* 32.6* 34.9*  MCV 107.2*  --  104.6*  --  103.1* 107.2* 105.1*  PLT 278   < > 264   < > 279 279 305   < > = values in this interval not displayed.  Cardiac Enzymes: No results for input(s): CKTOTAL, CKMB, CKMBINDEX, TROPONINI in the last 168 hours. CBG: Recent Labs  Lab 09/08/19 0651 09/08/19 1119 09/08/19 1608 09/08/19 2137 09/09/19 0724  GLUCAP 116* 198* 244* 164* 170*    Studies/Results: No results found. Medications:  .  stroke: mapping our early stages of recovery book   Does not apply Once  . allopurinol  100 mg Oral Daily  . aspirin  81 mg Oral Daily  . atorvastatin  40 mg Oral Daily  . Chlorhexidine Gluconate Cloth  6 each Topical Q0600  . Chlorhexidine Gluconate Cloth  6 each Topical Q0600  . darbepoetin (ARANESP) injection - DIALYSIS  150 mcg Intravenous Q Tue-HD  . feeding supplement (ENSURE ENLIVE)  237 mL Oral TID BM  . feeding supplement (PRO-STAT SUGAR FREE 64)  30 mL Oral BID  . insulin aspart  0-6 Units Subcutaneous TID WC  . insulin aspart  2 Units Subcutaneous  TID WC  . metoCLOPramide  5 mg Oral BID WC  . metoprolol succinate  25 mg Oral Daily  . multivitamin  1 tablet Oral QHS  . pantoprazole  40 mg Oral Daily  . predniSONE  5 mg Oral Q breakfast  . sodium chloride flush  3 mL Intravenous Q12H  . Warfarin - Pharmacist Dosing Inpatient   Does not apply [phone]

## 2019-09-09 NOTE — Plan of Care (Signed)
  Problem: Self-Care: Goal: Ability to participate in self-care as condition permits will improve Outcome: Progressing   Problem: Nutrition: Goal: Risk of aspiration will decrease Outcome: Progressing   

## 2019-09-10 ENCOUNTER — Inpatient Hospital Stay (HOSPITAL_COMMUNITY): Payer: Medicare Other

## 2019-09-10 LAB — RENAL FUNCTION PANEL
Albumin: 2.4 g/dL — ABNORMAL LOW (ref 3.5–5.0)
Anion gap: 14 (ref 5–15)
BUN: 59 mg/dL — ABNORMAL HIGH (ref 8–23)
CO2: 22 mmol/L (ref 22–32)
Calcium: 8.6 mg/dL — ABNORMAL LOW (ref 8.9–10.3)
Chloride: 99 mmol/L (ref 98–111)
Creatinine, Ser: 9.17 mg/dL — ABNORMAL HIGH (ref 0.61–1.24)
GFR calc Af Amer: 6 mL/min — ABNORMAL LOW (ref >60)
GFR calc non Af Amer: 5 mL/min — ABNORMAL LOW (ref >60)
Glucose, Bld: 133 mg/dL — ABNORMAL HIGH (ref 70–99)
Phosphorus: 3.8 mg/dL (ref 2.5–4.6)
Potassium: 5.2 mmol/L — ABNORMAL HIGH (ref 3.5–5.1)
Sodium: 135 mmol/L (ref 135–145)

## 2019-09-10 LAB — PROTIME-INR
INR: 3.4 — ABNORMAL HIGH (ref 0.8–1.2)
Prothrombin Time: 34.7 seconds — ABNORMAL HIGH (ref 11.4–15.2)

## 2019-09-10 LAB — GLUCOSE, CAPILLARY
Glucose-Capillary: 155 mg/dL — ABNORMAL HIGH (ref 70–99)
Glucose-Capillary: 163 mg/dL — ABNORMAL HIGH (ref 70–99)
Glucose-Capillary: 146 mg/dL — ABNORMAL HIGH (ref 70–99)
Glucose-Capillary: 154 mg/dL — ABNORMAL HIGH (ref 70–99)

## 2019-09-10 LAB — PROCALCITONIN: Procalcitonin: 1.16 ng/mL

## 2019-09-10 MED ORDER — DARBEPOETIN ALFA 150 MCG/0.3ML IJ SOSY
PREFILLED_SYRINGE | INTRAMUSCULAR | Status: AC
  Administered 2019-09-10: 150 ug via INTRAVENOUS
  Filled 2019-09-10: qty 0.3

## 2019-09-10 NOTE — Progress Notes (Addendum)
Subjective:  No cos . Noted febrile 102  earlier now  97.8   Objective Vital signs in last 24 hours: Vitals:   09/10/19 0450 09/10/19 0500 09/10/19 0612 09/10/19 0941  BP: (!) 151/90 140/85  116/73  Pulse: (!) 105 (!) 102 (!) 105 98  Resp: 16   16  Temp: (!) 102.1 F (38.9 C) (!) 102.5 F (39.2 C) (!) 102.1 F (38.9 C) 97.8 F (36.6 C)  TempSrc: Oral Oral Oral Oral  SpO2: 97%   97%  Weight: 70.8 kg     Height:       Weight change: 0.485 kg  Physical Exam General:Alert NAD , chronically ill Male  Heart: RRR, no M,R, G  Lungs: CTA , unlabored  Abdomen: soft NT, ND  Extremities: no LE edema Dialysis Access: right upper AVF+ bruit  Dialysis Orders: TTSG/O -[facility] when COVID 19 negative 4 hr 15 min 180NRE 400/800 80.5 kg 2.0 K/ 2.0 Ca R AVF/RIJ TDC -Heparin 8000 units IV TIW -Mircera 75 mcg IV q 2 weeks (last dose 07/25/19) -Venofer 50 mg IV weekly -Parsabiv 7.5 mg IV TIW   Problem/Plan: 1.Acute COVID-19 pneumonia - s/p remdesivir &Decadron. Per primary. Now out of 21 day isolation window but still required to have 2 negative COVID 19 test before he can return to [facility]- last + 1/14. Not retested since. Pt can dialyze at [facility] until COVID tests negative.   Noted Febrile  Earlier am  with Admit team checking CXR, UA , prolactin,"monitor off antibiotics"   BC  1/30 for fever no growth  So far /  2.ESRD- on HD TTS . K 5.2 .TDC removed1/26.  if time needs to be shortened - it shouldn't be < 3.5 - next HD Today   3. Anemiaof CKD-Hgb 10.8 .ContinueAranesp to 199mcg qwk. Transfuse prn.tsat 34%. 4. Secondary hyperparathyroidism-Ca/P ok. No binders or VDRA. No parsabiv because not on formulary.  5.HTN/volume- vol ok, tWell below EDW, will need to lower at d/c~70-71kgnet UF 1 L Saturday post wt 70.3 - had BP drop on last HD  into 80s 6. Nutrition- Albumin low 2.4 . Dys 3 diet Continue renal vit/prostat.  7.LV thrombus-Heparin bridging  tocoumadin. 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 Per primary 9. Disposition: SNF placement when bed available- having placement difficulties.   Ernest Haber, PA-C [facility] [phone] 09/10/2019,9:49 AM  LOS: 42 days   Labs: Basic Metabolic Panel: Recent Labs  Lab 09/08/19 0543 09/09/19 0218 09/10/19 0555  NA 138 139 135  K 4.8 5.3* 5.2*  CL 99 99 99  CO2 27 25 22   GLUCOSE 108* 163* 133*  BUN 25* 42* 59*  CREATININE 5.54* 7.50* 9.17*  CALCIUM 8.3* 8.9 8.6*  PHOS 3.2 3.7 3.8   Liver Function Tests: Recent Labs  Lab 09/08/19 0543 09/09/19 0218 09/10/19 0555  ALBUMIN 2.3* 2.6* 2.4*   No results for input(s): LIPASE, AMYLASE in the last 168 hours. No results for input(s): AMMONIA in the last 168 hours. CBC: Recent Labs  Lab 09/04/19 0617 09/04/19 0617 09/05/19 0720 09/05/19 0720 09/07/19 0353 09/08/19 0543 09/09/19 0218  WBC 10.3   < > 10.6*   < > 10.2 8.8 10.1  HGB 9.8*   < > 8.8*   < > 9.5* 10.1* 10.8*  HCT 31.2*   < > 27.1*   < > 29.9* 32.6* 34.9*  MCV 107.2*  --  104.6*  --  103.1* 107.2* 105.1*  PLT 278   < > 264   < > 279 279 305   < > = values in this interval not displayed.   Cardiac Enzymes: No results for input(s): CKTOTAL, CKMB, CKMBINDEX, TROPONINI in the last 168 hours. CBG: Recent Labs  Lab 09/09/19 0724 09/09/19 1134 09/09/19 1623 09/09/19 2112 09/10/19 0704  GLUCAP 170* 273* 226* 314* 155*    Studies/Results: DG Chest 2 View  Result Date: 09/10/2019 CLINICAL DATA:  Fever for 1 day EXAM: CHEST - 2 VIEW COMPARISON:  07/30/2019, 07/24/2019 FINDINGS: Frontal and lateral views of the chest demonstrate chronic elevation left hemidiaphragm. Diffuse interstitial opacities are again noted, with slight improvement in the bibasilar ground-glass airspace disease seen previously. No effusion or pneumothorax. No acute bony abnormalities. IMPRESSION: 1. Persistent but slightly  improving bibasilar ground-glass airspace disease consistent with multifocal pneumonia. Electronically Signed   By: Randa Ngo M.D.   On: 09/10/2019 09:00   Medications:  .  stroke: mapping our early stages of recovery book   Does not apply Once  . allopurinol  100 mg Oral Daily  . aspirin  81 mg Oral Daily  . atorvastatin  40 mg Oral Daily  . Chlorhexidine Gluconate Cloth  6 each Topical Q0600  . Chlorhexidine Gluconate Cloth  6 each Topical Q0600  . darbepoetin (ARANESP) injection - DIALYSIS  150 mcg Intravenous Q Tue-HD  . feeding supplement (ENSURE ENLIVE)  237 mL Oral TID BM  . feeding supplement (PRO-STAT SUGAR FREE 64)  30 mL Oral BID  . insulin aspart  0-6 Units Subcutaneous TID WC  . insulin aspart  2 Units Subcutaneous TID WC  . metoCLOPramide  5 mg Oral BID WC  . metoprolol succinate  25 mg Oral Daily  . multivitamin  1 tablet Oral QHS  . pantoprazole  40 mg Oral Daily  . predniSONE  5 mg Oral Q breakfast  . sodium chloride flush  3 mL Intravenous Q12H  . Warfarin - Pharmacist Dosing Inpatient   Does not apply [phone]

## 2019-09-10 NOTE — Progress Notes (Addendum)
PROGRESS NOTE    Jonathan Galayda Sr.  [id number] DOB: Jun 23, 1951 DOA: 07/30/2019 PCP: Marton Redwood, MD     Brief Narrative:  Jonathan Delconte Sr. 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 MRI brain showed small infarct in the left pons left occiput. Neurology saw the patient and felt this was secondary to left ventricular thrombus; he was started on heparin. He was diagnosed with Covid 07/24/2019. Patient has recurrently tested positive but is asymptomatic and this is probably positivity from his index infection in December.  New events last 24 hours / Subjective: Fevers early this morning, Tmax 102.5. Patient continues to feel well however. Denies any chest pain, SOB, cough, N/V/D, dysuria, abdominal pain, open skin wounds or ulcers.   Assessment & Plan:   Principal Problem:   Acute respiratory failure due to COVID-19 [facility]) Active Problems:   Chronic hepatitis C without hepatic coma ([name])   Diabetes mellitus type 2, uncomplicated ([place])   History of coronary artery disease   Hypertension   ESRD (end stage renal disease) on dialysis ([place])   Acute respiratory failure with hypoxia ([name])   Cerebral thrombosis with cerebral infarction   Cerebral embolism with cerebral infarction   COVID-19 pneumonia -Initially tested positive in December 2020 and completed appropriate treatment.  No longer infectious and off of precautions at this point.  Assumed that patient would continue to test positive for 90 days from his initial positive test  Acute strokes left pons left occipital -Etiology unclear but though to be secondary to Covid versus LV thrombus -Continue ASA, lipitor, coumadin  -Appreciate neurology/stroke team -SNF placement recommended  ESRD/HD TTSa    -Nephrology following  LV thrombus -Coumadin per pharmacy  Prior cadaveric renal transplant -Continue prednisone  Hyperglycemia, Pre-diabetes -Continue NovoLog  HTN -Continue toprol   Fever -No etiology of infection found -Blood culture 1/30 after fever, negative to date -Check UA, CXR, procalcitonin this morning  -Monitor off antibiotics    DVT prophylaxis: Coumadin Code Status: Full Family Communication: None at bedside  Disposition Plan: Patient presented from home and currently PT recommendation is for SNF.  Discharge barrier is due to finding SNF placement as patient continues to test positive for Covid infection which was treated in December.   Consultants:   Neurology  Nephrology   Antimicrobials:  Anti-infectives (From admission, onward)   Start     Dose/Rate Route Frequency Ordered Stop   07/31/19 1000  remdesivir 100 mg in sodium chloride 0.9 % 100 mL IVPB     100 mg 200 mL/hr over 30 Minutes Intravenous Daily 07/30/19 2217 08/04/19 0748   07/31/19 0800  ceFEPIme (MAXIPIME) 1 g in sodium chloride 0.9 % 100 mL IVPB  Status:  Discontinued     1 g 200 mL/hr over 30 Minutes Intravenous Every 24 hours 07/31/19 0748 07/31/19 0816   07/31/19 0800  vancomycin (VANCOREADY) IVPB 1500 mg/300 mL  Status:  Discontinued     1,500 mg 150 mL/hr over 120 Minutes Intravenous  Once 07/31/19 0748 07/31/19 0816   07/31/19 0748  vancomycin variable dose per unstable renal function (pharmacist dosing)  Status:  Discontinued      Does not apply See admin instructions 07/31/19 0748 07/31/19 0816   07/30/19 2230  remdesivir 200 mg in sodium chloride 0.9% 250 mL IVPB  200 mg 580 mL/hr over 30 Minutes Intravenous Once 07/30/19 2217 07/31/19 0045       Objective: Vitals:   09/09/19 2124 09/10/19 0450 09/10/19 0500 09/10/19 0612  BP: 136/89 (!) 151/90 140/85   Pulse: 92 (!) 105 (!) 102 (!) 105  Resp: 16 16    Temp: 97.7 F (36.5 C) (!) 102.1 F (38.9 C) (!) 102.5 F  (39.2 C) (!) 102.1 F (38.9 C)  TempSrc: Oral Oral Oral Oral  SpO2: 97% 97%    Weight:  70.8 kg    Height:        Intake/Output Summary (Last 24 hours) at 09/10/2019 0840 Last data filed at 09/10/2019 0600 Gross per 24 hour  Intake 803 ml  Output --  Net 803 ml   Filed Weights   09/08/19 2137 09/09/19 0500 09/10/19 0450  Weight: 70.3 kg 70.3 kg 70.8 kg     Examination: General exam: Appears calm and comfortable  Respiratory system: Clear to auscultation. Respiratory effort normal. Cardiovascular system: S1 & S2 heard, RRR. No pedal edema. Gastrointestinal system: Abdomen is nondistended, soft and nontender. Normal bowel sounds heard. Central nervous system: Alert and oriented. Non focal exam. Speech clear  Extremities: Symmetric in appearance bilaterally  Skin: No rashes, lesions or ulcers on exposed skin  Psychiatry: Judgement and insight appear stable. Mood & affect appropriate.    Data Reviewed: I have personally reviewed following labs and imaging studies  CBC: Recent Labs  Lab 09/04/19 0617 09/05/19 0720 09/07/19 0353 09/08/19 0543 09/09/19 0218  WBC 10.3 10.6* 10.2 8.8 10.1  HGB 9.8* 8.8* 9.5* 10.1* 10.8*  HCT 31.2* 27.1* 29.9* 32.6* 34.9*  MCV 107.2* 104.6* 103.1* 107.2* 105.1*  PLT 278 264 279 279 [id number]   Basic Metabolic Panel: Recent Labs  Lab 09/04/19 0617 09/05/19 0412 09/06/19 0854 09/07/19 0353 09/08/19 0543 09/09/19 0218 09/10/19 0555  NA 134*   < > 134* 131* 138 139 135  K 5.1   < > 4.7 5.1 4.8 5.3* 5.2*  CL 97*   < > 96* 94* 99 99 99  CO2 27   < > 27 25 27 25 22   GLUCOSE 141*   < > 213* 132* 108* 163* 133*  BUN 35*   < > 41* 52* 25* 42* 59*  CREATININE 6.73*   < > 6.54* 7.67* 5.54* 7.50* 9.17*  CALCIUM 8.8*   < > 8.5* 8.5* 8.3* 8.9 8.6*  MG 1.9  --   --   --   --   --   --   PHOS 4.3   < > 3.9 2.9 3.2 3.7 3.8   < > = values in this interval not displayed.   GFR: Estimated Creatinine Clearance: 7.9 mL/min (A) (by C-G formula based on  SCr of 9.17 mg/dL (H)). Liver Function Tests: Recent Labs  Lab 09/06/19 0854 09/07/19 0353 09/08/19 0543 09/09/19 0218 09/10/19 0555  ALBUMIN 2.4* 2.3* 2.3* 2.6* 2.4*   No results for input(s): LIPASE, AMYLASE in the last 168 hours. No results for input(s): AMMONIA in the last 168 hours. Coagulation Profile: Recent Labs  Lab 09/06/19 0854 09/07/19 0353 09/08/19 0543 09/09/19 0218 09/10/19 0555  INR 1.8* 2.1* 2.3* 2.5* 3.4*   Cardiac Enzymes: No results for input(s): CKTOTAL, CKMB, CKMBINDEX, TROPONINI in the last 168 hours. BNP (last 3 results) No results for input(s): PROBNP in the last 8760 hours. HbA1C: No results for input(s): HGBA1C in the last 72 hours. CBG: Recent Labs  Lab 09/09/19 0724 09/09/19  1134 09/09/19 1623 09/09/19 2112 09/10/19 0704  GLUCAP 170* 273* 226* 314* 155*   Lipid Profile: No results for input(s): CHOL, HDL, LDLCALC, TRIG, CHOLHDL, LDLDIRECT in the last 72 hours. Thyroid Function Tests: No results for input(s): TSH, T4TOTAL, FREET4, T3FREE, THYROIDAB in the last 72 hours. Anemia Panel: No results for input(s): VITAMINB12, FOLATE, FERRITIN, TIBC, IRON, RETICCTPCT in the last 72 hours. Sepsis Labs: No results for input(s): PROCALCITON, LATICACIDVEN in the last 168 hours.  Recent Results (from the past 240 hour(s))  Culture, blood (routine x 2)     Status: None (Preliminary result)   Collection Time: 09/07/19  5:55 PM   Specimen: BLOOD LEFT HAND  Result Value Ref Range Status   Specimen Description BLOOD LEFT HAND  Final   Special Requests   Final    BOTTLES DRAWN AEROBIC AND ANAEROBIC Blood Culture adequate volume   Culture NO GROWTH 3 DAYS  Final   Report Status PENDING  Incomplete  Culture, blood (routine x 2)     Status: None (Preliminary result)   Collection Time: 09/07/19  6:04 PM   Specimen: BLOOD LEFT HAND  Result Value Ref Range Status   Specimen Description BLOOD LEFT HAND  Final   Special Requests   Final    BOTTLES  DRAWN AEROBIC ONLY Blood Culture adequate volume   Culture NO GROWTH 3 DAYS  Final   Report Status PENDING  Incomplete      Radiology Studies: No results found.    Scheduled Meds: .  stroke: mapping our early stages of recovery book   Does not apply Once  . allopurinol  100 mg Oral Daily  . aspirin  81 mg Oral Daily  . atorvastatin  40 mg Oral Daily  . Chlorhexidine Gluconate Cloth  6 each Topical Q0600  . Chlorhexidine Gluconate Cloth  6 each Topical Q0600  . darbepoetin (ARANESP) injection - DIALYSIS  150 mcg Intravenous Q Tue-HD  . feeding supplement (ENSURE ENLIVE)  237 mL Oral TID BM  . feeding supplement (PRO-STAT SUGAR FREE 64)  30 mL Oral BID  . insulin aspart  0-6 Units Subcutaneous TID WC  . insulin aspart  2 Units Subcutaneous TID WC  . metoCLOPramide  5 mg Oral BID WC  . metoprolol succinate  25 mg Oral Daily  . multivitamin  1 tablet Oral QHS  . pantoprazole  40 mg Oral Daily  . predniSONE  5 mg Oral Q breakfast  . sodium chloride flush  3 mL Intravenous Q12H  . Warfarin - Pharmacist Dosing Inpatient   Does not apply q1800   Continuous Infusions:    LOS: 42 days      Time spent: 20 minutes   Dessa Phi, DO [facility] 09/10/2019, 8:40 AM   Available via Epic secure chat 7am-7pm After these hours, please refer to coverage provider listed on [email or web address]

## 2019-09-10 NOTE — Progress Notes (Signed)
ANTICOAGULATION CONSULT NOTE - Follow Up Consult  Pharmacy Consult for Coumadin Indication: LV thrombus, CVA  No Known Allergies  Patient Measurements: Height: 5\' 11"  (180.3 cm) Weight: 156 lb 1.4 oz (70.8 kg) IBW/kg (Calculated) : 75.3  Vital Signs: Temp: 102.1 F (38.9 C) (02/02 0612) Temp Source: Oral (02/02 0612) BP: 140/85 (02/02 0500) Pulse Rate: 105 (02/02 0612)  Labs: Recent Labs    09/08/19 0543 09/09/19 0218 09/10/19 0555  HGB 10.1* 10.8*  --   HCT 32.6* 34.9*  --   PLT 279 305  --   LABPROT 25.6* 27.3* 34.7*  INR 2.3* 2.5* 3.4*  CREATININE 5.54* 7.50* 9.17*    Estimated Creatinine Clearance: 7.9 mL/min (A) (by C-G formula based on SCr of 9.17 mg/dL (H)).   Assessment:  Anticoag: Apix per MD> heparin gtt for h/o LV thrombus > warfarin  (also recent stroke noted) - INR 3.4  Goal of Therapy:  INR 2-3 Monitor platelets by anticoagulation protocol: Yes   Plan:  - Hold Warfarin this evening.  - Daily INR  Alanda Slim, PharmD, [facility] Clinical Pharmacist Please see AMION for all Pharmacists' Contact Phone Numbers 09/10/2019, 8:15 AM

## 2019-09-10 NOTE — Progress Notes (Signed)
Occupational Therapy Treatment Patient Details Name: Jonathan Wolinski Sr. MRN: [id number] DOB: [birth year] Today's Date: 09/10/2019    History of present illness 69 y.o. 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 Diagnosed with COVID 6 days prior to coming to ED, where chest x-ray showed multifocal pneumonia. Admitted 07/30/19 for treatment of acute encephalopathy, difficulty swallowing and pneumonia.MRI of the brain showed small acute infarcts in the left pons, left occipital lob and right frontal lobe in addition to chronic infarcts    OT comments  Patient supine in bed on bedpan upon entry, agreeable to transition to 3:1 commode for toileting.  Completing bed mobility with min guard assist, mod assist for stand pivot transfers and total assist for toileting after +BM.  Patient continues to be limited by impaired cognition, weakness, tolerance, and balance.  Follows 1 step cues inconsistently and requires increased time for processing. Reports 2002, re-oriented to year.  Supine in bed and transport arrived for HD.  Will follow acutely. Updated goals.    Follow Up Recommendations  SNF;Supervision/Assistance - 24 hour    Equipment Recommendations  None recommended by OT    Recommendations for Other Services      Precautions / Restrictions Precautions Precautions: Fall Restrictions Weight Bearing Restrictions: No       Mobility Bed Mobility Overal bed mobility: Needs Assistance Bed Mobility: Sit to Supine;Supine to Sit     Supine to sit: Min guard Sit to supine: Min guard   General bed mobility comments: cueing for technique, min guard for safety   Transfers Overall transfer level: Needs assistance Equipment used: Rolling walker (2 wheeled) Transfers: Sit to/from [facility] Sit to  Stand: Mod assist Stand pivot transfers: Mod assist       General transfer comment: mod assist to power up and steady, cueing for hand placement and technique     Balance Overall balance assessment: Needs assistance Sitting-balance support: Feet supported Sitting balance-Leahy Scale: Fair Sitting balance - Comments: min guard to close supervision    Standing balance support: Bilateral upper extremity supported;During functional activity Standing balance-Leahy Scale: Poor Standing balance comment: reliant on BUE and external support                           ADL either performed or assessed with clinical judgement   ADL Overall ADL's : Needs assistance/impaired                 Upper Body Dressing : Moderate assistance;Sitting Upper Body Dressing Details (indicate cue type and reason): to change gown      Toilet Transfer: Moderate assistance;+2 for physical assistance;Stand-pivot;BSC;RW Toilet Transfer Details (indicate cue type and reason): mod assist to power up and steady Toileting- Clothing Manipulation and Hygiene: Total assistance;Sit to/from stand Toileting - Clothing Manipulation Details (indicate cue type and reason): requires total assist for hygiene after +BM and clothing mgmt      Functional mobility during ADLs: Moderate assistance;Rolling walker General ADL Comments: remains limited by cognition, weakness and balance     Vision       Perception     Praxis      Cognition Arousal/Alertness: Awake/alert Behavior During Therapy: Flat affect Overall Cognitive Status: Impaired/Different from baseline Area of Impairment: Orientation;Memory;Attention;Following commands;Safety/judgement;Problem solving;Awareness  Orientation Level: Disoriented to;Time(2002) Current Attention Level: Selective Memory: Decreased short-term memory Following Commands: Follows one step commands inconsistently;Follows one step commands with  increased time Safety/Judgement: Decreased awareness of safety;Decreased awareness of deficits Awareness: Intellectual Problem Solving: Slow processing;Requires verbal cues;Difficulty sequencing;Decreased initiation General Comments: patient requires cueing for recall, cueing for sequencing and safety awareness; reports 2002 when asked the year toda y        Exercises     Shoulder Instructions       General Comments      Pertinent Vitals/ Pain       Pain Assessment: No/denies pain  Home Living                                          Prior Functioning/Environment              Frequency  Min 2X/week        Progress Toward Goals  OT Goals(current goals can now be found in the care plan section)  Progress towards OT goals: Progressing toward goals  Acute Rehab OT Goals Patient Stated Goal: none stated  Plan Discharge plan remains appropriate;Frequency remains appropriate    Co-evaluation                 AM-PAC OT "6 Clicks" Daily Activity     Outcome Measure   Help from another person eating meals?: A Little Help from another person taking care of personal grooming?: A Little Help from another person toileting, which includes using toliet, bedpan, or urinal?: A Lot Help from another person bathing (including washing, rinsing, drying)?: A Lot Help from another person to put on and taking off regular upper body clothing?: A Little Help from another person to put on and taking off regular lower body clothing?: A Lot 6 Click Score: 15    End of Session Equipment Utilized During Treatment: Gait belt;Rolling walker  OT Visit Diagnosis: Unsteadiness on feet (R26.81);Other symptoms and signs involving cognitive function;Other abnormalities of gait and mobility (R26.89)   Activity Tolerance Patient tolerated treatment well   Patient Left in bed;with call bell/phone within reach;with bed alarm set   Nurse Communication Mobility status         Time: [id number] OT Time Calculation (min): 20 min  Charges: OT General Charges $OT Visit: 1 Visit OT Treatments $Self Care/Home Management : 8-22 mins  Jolaine Artist, OT Acute Rehabilitation Services Pager [phone] Office [phone]    Delight Stare 09/10/2019, 2:08 PM

## 2019-09-10 NOTE — Progress Notes (Signed)
   09/10/19 0500  Vitals  Temp (!) 102.5 F (39.2 C)  Temp Source Oral  BP 140/85  MAP (mmHg) 110  BP Location Left Arm  BP Method Automatic  Patient Position (if appropriate) Lying  Pulse Rate (!) 102    textpaged NP Sharlet Salina and made aware. Gave PRN Tylenol 650 mg P.O. continue to monitor pt.

## 2019-09-10 NOTE — TOC Progression Note (Signed)
Transition of Care (TOC) - Progression Note    Patient Details  Name: Jonathan [name] Sr. MRN: [id number] Date of Birth: [birth year]  Transition of Care [facility]) CM/SW Contact  Sharlet Salina Mila Homer, LCSW Phone Number: 09/10/2019, 1:48 PM  Clinical Narrative: Patient in need of SNF placement. Mr. Bendavid is a dialysis patient and his 07/24/19 COVID test was positive. SNF search has been unsuccessful. TOC leadership is also aware. CSW emailed Audelia Acton with [facility] regarding patient. Received a call from Michie and provided background information. Audelia Acton will review patient's chart and get back with CSW.    Expected Discharge Plan: [place]    Expected Discharge Plan and Services Expected Discharge Plan: [place] arrangements for the past 2 months: Single Family Home                                       Social Determinants of Health (SDOH) Interventions  Unable to find SNF placement for patient.  Readmission Risk Interventions No flowsheet data found.

## 2019-09-11 LAB — URINALYSIS, ROUTINE W REFLEX MICROSCOPIC
Bilirubin Urine: NEGATIVE
Glucose, UA: NEGATIVE mg/dL
Ketones, ur: NEGATIVE mg/dL
Nitrite: NEGATIVE
Protein, ur: 100 mg/dL — AB
RBC / HPF: >50 RBC/hpf — ABNORMAL HIGH (ref 0–5)
Specific Gravity, Urine: 1.012 (ref 1.005–1.030)
WBC, UA: >50 WBC/hpf — ABNORMAL HIGH (ref 0–5)
pH: 8 (ref 5.0–8.0)

## 2019-09-11 LAB — RENAL FUNCTION PANEL
Albumin: 2.3 g/dL — ABNORMAL LOW (ref 3.5–5.0)
Anion gap: 15 (ref 5–15)
BUN: 34 mg/dL — ABNORMAL HIGH (ref 8–23)
CO2: 26 mmol/L (ref 22–32)
Calcium: 8.5 mg/dL — ABNORMAL LOW (ref 8.9–10.3)
Chloride: 98 mmol/L (ref 98–111)
Creatinine, Ser: 6.62 mg/dL — ABNORMAL HIGH (ref 0.61–1.24)
GFR calc Af Amer: 9 mL/min — ABNORMAL LOW (ref >60)
GFR calc non Af Amer: 8 mL/min — ABNORMAL LOW (ref >60)
Glucose, Bld: 119 mg/dL — ABNORMAL HIGH (ref 70–99)
Phosphorus: 3.7 mg/dL (ref 2.5–4.6)
Potassium: 4.7 mmol/L (ref 3.5–5.1)
Sodium: 139 mmol/L (ref 135–145)

## 2019-09-11 LAB — CBC
HCT: 29.9 % — ABNORMAL LOW (ref 39.0–52.0)
Hemoglobin: 9.5 g/dL — ABNORMAL LOW (ref 13.0–17.0)
MCH: 32.3 pg (ref 26.0–34.0)
MCHC: 31.8 g/dL (ref 30.0–36.0)
MCV: 101.7 fL — ABNORMAL HIGH (ref 80.0–100.0)
Platelets: 246 10*3/uL (ref 150–400)
RBC: 2.94 MIL/uL — ABNORMAL LOW (ref 4.22–5.81)
RDW: 17.6 % — ABNORMAL HIGH (ref 11.5–15.5)
WBC: 8.4 10*3/uL (ref 4.0–10.5)
nRBC: 0 % (ref 0.0–0.2)

## 2019-09-11 LAB — GLUCOSE, CAPILLARY
Glucose-Capillary: 113 mg/dL — ABNORMAL HIGH (ref 70–99)
Glucose-Capillary: 219 mg/dL — ABNORMAL HIGH (ref 70–99)
Glucose-Capillary: 173 mg/dL — ABNORMAL HIGH (ref 70–99)
Glucose-Capillary: 110 mg/dL — ABNORMAL HIGH (ref 70–99)

## 2019-09-11 LAB — PROCALCITONIN: Procalcitonin: 1.74 ng/mL

## 2019-09-11 LAB — PROTIME-INR
INR: 3.2 — ABNORMAL HIGH (ref 0.8–1.2)
Prothrombin Time: 32.3 seconds — ABNORMAL HIGH (ref 11.4–15.2)

## 2019-09-11 MED ORDER — SODIUM CHLORIDE 0.9 % IV SOLN
1.0000 g | Freq: Every day | INTRAVENOUS | Status: AC
  Administered 2019-09-11 – 2019-09-15 (×5): 1 g via INTRAVENOUS
  Filled 2019-09-11 (×2): qty 1
  Filled 2019-09-11 (×2): qty 10
  Filled 2019-09-11: qty 1

## 2019-09-11 MED ORDER — NEPRO/CARBSTEADY PO LIQD
237.0000 mL | Freq: Two times a day (BID) | ORAL | Status: DC
  Administered 2019-09-11 – 2019-09-18 (×11): 237 mL via ORAL

## 2019-09-11 NOTE — Progress Notes (Signed)
Subjective:  No cos, tolerated 2 l uf HD yest / temp down past 24hr    Objective Vital signs in last 24 hours: Vitals:   09/10/19 1816 09/10/19 2058 09/11/19 0527 09/11/19 1016  BP: 131/68 (!) 155/60 138/61 (!) 147/78  Pulse: 93 (!) 105 (!) 102 96  Resp: 18     Temp: 98.8 F (37.1 C) 99.7 F (37.6 C) 99.2 F (37.3 C) 98.4 F (36.9 C)  TempSrc:  Oral Oral Oral  SpO2: 95% 96% 94% 96%  Weight:      Height:       Weight change: -0.1 kg  Physical Exam General:Alert NAD , chronically ill Male Heart: RRR, no M,R, G Lungs:CTA , unlabored Abdomen: soft NT, ND Extremities: no LE edema Dialysis Access: right upper AVF+ bruit  Dialysis Orders: TTSG/O -[facility] when COVID 19 negative 4 hr 15 min 180NRE 400/800 80.5 kg 2.0 K/ 2.0 Ca R AVF/RIJ TDC -Heparin 8000 units IV TIW -Mircera 75 mcg IV q 2 weeks (last dose 07/25/19) -Venofer 50 mg IV weekly -Parsabiv 7.5 mg IV TIW   Problem/Plan: 1.Acute COVID-19 pneumonia - s/p remdesivir &Decadron. Per primary. Now out of 21 day isolation window but still required to have 2 negative COVID 19 test before he can return to [facility]- last + 1/14. Not retested since. Pt can dialyze at [facility] until COVID tests negative.   Fever now decr  with Admit team checking CXR= Multifocal PNA  woth slightly improved appearance  Rocephin started  / UA cs pend    BC  1/30 for fever no growth  So far /  2.ESRD- on HD TTS. K 4.7  .TDC removed1/26. if time needs to be shortened - it shouldn't be <3.5 - next HD Tomor  3. Anemiaof CKD-Hgb 10.8. 9.5 .ContinueAranesp to 126mcg qwk. Transfuse prn.tsat 34%. 4. Secondary hyperparathyroidism-Ca/P ok. No binders or VDRA. No parsabiv because not on formulary.  5.HTN/volume-vol ok, 2.0 l uf hd yest now Well below EDW, will need to lower at d/c 6. Nutrition- Albumin low2.3. Dys 3 diet Continue renal vit/prostat. And ensure change to NePro   With some  K^  with ensure  7.LV  thrombus-Heparin bridging tocoumadin. 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 Per primary 9.Disposition: SNF placement when bed available- having placement difficulties.  Ernest Haber, PA-C [facility] Beeper [phone] 09/11/2019,11:13 AM  LOS: 43 days   Labs: Basic Metabolic Panel: Recent Labs  Lab 09/09/19 0218 09/10/19 0555 09/11/19 0553  NA 139 135 139  K 5.3* 5.2* 4.7  CL 99 99 98  CO2 25 22 26   GLUCOSE 163* 133* 119*  BUN 42* 59* 34*  CREATININE 7.50* 9.17* 6.62*  CALCIUM 8.9 8.6* 8.5*  PHOS 3.7 3.8 3.7   Liver Function Tests: Recent Labs  Lab 09/09/19 0218 09/10/19 0555 09/11/19 0553  ALBUMIN 2.6* 2.4* 2.3*   No results for input(s): LIPASE, AMYLASE in the last 168 hours. No results for input(s): AMMONIA in the last 168 hours. CBC: Recent Labs  Lab 09/05/19 0720 09/05/19 0720 09/07/19 0353 09/07/19 0353 09/08/19 0543 09/09/19 0218 09/11/19 0553  WBC 10.6*   < > 10.2   < > 8.8 10.1 8.4  HGB 8.8*   < > 9.5*   < > 10.1* 10.8* 9.5*  HCT 27.1*   < > 29.9*   < > 32.6* 34.9* 29.9*  MCV 104.6*  --  103.1*  --  107.2* 105.1* 101.7*  PLT  264   < > 279   < > 279 305 246   < > = values in this interval not displayed.   Cardiac Enzymes: No results for input(s): CKTOTAL, CKMB, CKMBINDEX, TROPONINI in the last 168 hours. CBG: Recent Labs  Lab 09/10/19 0704 09/10/19 1132 09/10/19 1817 09/10/19 2104 09/11/19 0735  GLUCAP 155* 163* 146* 154* 113*    Studies/Results: DG Chest 2 View  Result Date: 09/10/2019 CLINICAL DATA:  Fever for 1 day EXAM: CHEST - 2 VIEW COMPARISON:  07/30/2019, 07/24/2019 FINDINGS: Frontal and lateral views of the chest demonstrate chronic elevation left hemidiaphragm. Diffuse interstitial opacities are again noted, with slight improvement in the bibasilar ground-glass airspace disease seen previously. No effusion or pneumothorax. No acute bony abnormalities. IMPRESSION: 1.  Persistent but slightly improving bibasilar ground-glass airspace disease consistent with multifocal pneumonia. Electronically Signed   By: Randa Ngo M.D.   On: 09/10/2019 09:00   Medications: . cefTRIAXone (ROCEPHIN)  IV 1 g (09/11/19 0904)   .  stroke: mapping our early stages of recovery book   Does not apply Once  . allopurinol  100 mg Oral Daily  . aspirin  81 mg Oral Daily  . atorvastatin  40 mg Oral Daily  . Chlorhexidine Gluconate Cloth  6 each Topical Q0600  . Chlorhexidine Gluconate Cloth  6 each Topical Q0600  . darbepoetin (ARANESP) injection - DIALYSIS  150 mcg Intravenous Q Tue-HD  . feeding supplement (ENSURE ENLIVE)  237 mL Oral TID BM  . feeding supplement (PRO-STAT SUGAR FREE 64)  30 mL Oral BID  . insulin aspart  0-6 Units Subcutaneous TID WC  . insulin aspart  2 Units Subcutaneous TID WC  . metoCLOPramide  5 mg Oral BID WC  . metoprolol succinate  25 mg Oral Daily  . multivitamin  1 tablet Oral QHS  . pantoprazole  40 mg Oral Daily  . predniSONE  5 mg Oral Q breakfast  . sodium chloride flush  3 mL Intravenous Q12H  . Warfarin - Pharmacist Dosing Inpatient   Does not apply [phone]

## 2019-09-11 NOTE — Progress Notes (Signed)
ANTICOAGULATION CONSULT NOTE - Follow Up Consult  Pharmacy Consult for Coumadin Indication: LV thrombus, CVA  No Known Allergies  Patient Measurements: Height: 5\' 11"  (180.3 cm) Weight: 149 lb 7.6 oz (67.8 kg) IBW/kg (Calculated) : 75.3  Vital Signs: Temp: 99.2 F (37.3 C) (02/03 0527) Temp Source: Oral (02/03 0527) BP: 138/61 (02/03 0527) Pulse Rate: 102 (02/03 0527)  Labs: Recent Labs    09/09/19 0218 09/10/19 0555 09/11/19 0553  HGB 10.8*  --  9.5*  HCT 34.9*  --  29.9*  PLT 305  --  246  LABPROT 27.3* 34.7* 32.3*  INR 2.5* 3.4* 3.2*  CREATININE 7.50* 9.17* 6.62*    Estimated Creatinine Clearance: 10.5 mL/min (A) (by C-G formula based on SCr of 6.62 mg/dL (H)).   Assessment:  Anticoag: Apix per MD> heparin gtt for h/o LV thrombus > warfarin  (also recent stroke noted) - INR 3.2  Goal of Therapy:  INR 2-3 Monitor platelets by anticoagulation protocol: Yes   Plan:  - Hold Warfarin again this evening.  - Daily INR  Alanda Slim, PharmD, [facility] Clinical Pharmacist Please see AMION for all Pharmacists' Contact Phone Numbers 09/11/2019, 8:15 AM

## 2019-09-11 NOTE — Plan of Care (Signed)
  Problem: Nutrition: Goal: Risk of aspiration will decrease Outcome: Progressing   Problem: Nutrition: Goal: Dietary intake will improve Outcome: Progressing

## 2019-09-11 NOTE — Progress Notes (Signed)
Nutrition Follow up   DOCUMENTATION CODES:   Not applicable(suspect PCM)  INTERVENTION:  Recommend Cortrak placement if intake remains poor    Continue Ensure Enlive po TID, each supplement provides 350 kcal and 20 grams of protein  Continue 30 ml Prostat BID, each supplement provides 100 kcals and 15 grams protein.   Continue renal MVI daily   NUTRITION DIAGNOSIS:   Increased nutrient needs related to chronic illness(ESRD on HD) as evidenced by estimated needs.  Ongoing  GOAL:   Patient will meet greater than or equal to 90% of their needs   Progressed slightly   MONITOR:   PO intake, Supplement acceptance, Weight trends, Labs, I & O's, Skin  REASON FOR ASSESSMENT:   LOS    ASSESSMENT:   Patient with PMH significant for R kidney transplant, ESRD on HD, CVA, HTN, and DM. Presents this admission with COVID 19 PNA.   Pt reports having great appetite. Meal completion charted as 25-100% for his last seven meals (43% average). Drinking one Ensure daily. Discussed need for increased kcal and protein. Pt willing to drink 2-3 Ensures daily. Recommend Cortrak placement if intake remains poor.   EDW: 80.5 kg  Current weight: 67.8 kg  I/O: -2,435 ml since 1/20 UOP: 300 ml x 24 hrs  Last HD yesterday: 2000 ml net UF   Medications: aranesp, SS novolog, reglan, rena-vit, prednisone Labs: CBG 113-219  Diet Order:   Diet Order            DIET DYS 3 Room service appropriate? Yes; Fluid consistency: Thin; Fluid restriction: 1500 mL Fluid  Diet effective now              EDUCATION NEEDS:   Not appropriate for education at this time  Skin:  Skin Assessment: Reviewed RN Assessment  Last BM:  2/2  Height:   Ht Readings from Last 1 Encounters:  07/31/19 5\' 11"  (1.803 m)    Weight:   Wt Readings from Last 1 Encounters:  09/10/19 67.8 kg    Ideal Body Weight:  78.2 kg  BMI:  Body mass index is 20.85 kg/m.  Estimated Nutritional Needs:   Kcal:   2300-2500 kcal  Protein:  115-130 grams  Fluid:  1000 ml + UOP   Mariana Single RD, LDN Clinical Nutrition Pager # [phone]

## 2019-09-11 NOTE — Plan of Care (Signed)
  Problem: Nutrition: Goal: Risk of aspiration will decrease Outcome: Progressing   

## 2019-09-11 NOTE — Progress Notes (Signed)
PROGRESS NOTE    Jonathan Byun Sr.  [id number] DOB: 12-23-1950 DOA: 07/30/2019 PCP: Marton Redwood, MD     Brief Narrative:  Jonathan Jacox Sr. 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 MRI brain showed small infarct in the left pons left occiput. Neurology saw the patient and felt this was secondary to left ventricular thrombus; he was started on heparin. He was diagnosed with Covid 07/24/2019. Patient has recurrently tested positive but is asymptomatic and this is probably positivity from his index infection in December.  New events last 24 hours / Subjective: Afebrile overnight. Eating breakfast in bed without any complaints.   Assessment & Plan:   Principal Problem:   Acute respiratory failure due to COVID-19 [facility]) Active Problems:   Chronic hepatitis C without hepatic coma ([name])   Diabetes mellitus type 2, uncomplicated ([place])   History of coronary artery disease   Hypertension   ESRD (end stage renal disease) on dialysis ([place])   Acute respiratory failure with hypoxia ([name])   Cerebral thrombosis with cerebral infarction   Cerebral embolism with cerebral infarction   COVID-19 pneumonia -Initially tested positive in December 2020 and completed appropriate treatment.  No longer infectious and off of precautions at this point.  Assumed that patient could continue to test positive for 90 days from his initial positive test  Acute strokes left pons left occipital -Etiology unclear but though to be secondary to Covid versus LV thrombus -Continue ASA, lipitor, coumadin  -Appreciate neurology/stroke team -SNF placement recommended  ESRD/HD TTSa  -Nephrology following  LV thrombus -Coumadin per pharmacy  Prior cadaveric renal transplant -Continue  prednisone  Hyperglycemia, Pre-diabetes -Continue NovoLog  HTN -Continue toprol   UTI, not POA, not CAUTI  -Blood culture 1/30 after fever, negative to date -Urine culture pending -Rocephin x 5 days    DVT prophylaxis: Coumadin Code Status: Full Family Communication: None at bedside  Disposition Plan: Patient presented from home and currently PT recommendation is for SNF.  Discharge barrier is due to finding SNF placement as patient continues to test positive for Covid infection which was treated in December.   Consultants:   Neurology  Nephrology   Antimicrobials:  Anti-infectives (From admission, onward)   Start     Dose/Rate Route Frequency Ordered Stop   09/11/19 0830  cefTRIAXone (ROCEPHIN) 1 g in sodium chloride 0.9 % 100 mL IVPB     1 g 200 mL/hr over 30 Minutes Intravenous Daily 09/11/19 0726 09/16/19 0959   07/31/19 1000  remdesivir 100 mg in sodium chloride 0.9 % 100 mL IVPB     100 mg 200 mL/hr over 30 Minutes Intravenous Daily 07/30/19 2217 08/04/19 0748   07/31/19 0800  ceFEPIme (MAXIPIME) 1 g in sodium chloride 0.9 % 100 mL IVPB  Status:  Discontinued     1 g 200 mL/hr over 30 Minutes Intravenous Every 24 hours 07/31/19 0748 07/31/19 0816   07/31/19 0800  vancomycin (VANCOREADY) IVPB 1500 mg/300 mL  Status:  Discontinued     1,500 mg 150 mL/hr over 120 Minutes Intravenous  Once 07/31/19 0748 07/31/19 0816   07/31/19 0748  vancomycin variable dose per unstable renal function (pharmacist dosing)  Status:  Discontinued      Does not apply See admin instructions 07/31/19 0748 07/31/19 0816   07/30/19  2230  remdesivir 200 mg in sodium chloride 0.9% 250 mL IVPB     200 mg 580 mL/hr over 30 Minutes Intravenous Once 07/30/19 2217 07/31/19 0045       Objective: Vitals:   09/10/19 1726 09/10/19 1816 09/10/19 2058 09/11/19 0527  BP: (!) 125/92 131/68 (!) 155/60 138/61  Pulse: 96 93 (!) 105 (!) 102  Resp: 18 18    Temp: 97.8 F (36.6 C) 98.8 F (37.1 C)  99.7 F (37.6 C) 99.2 F (37.3 C)  TempSrc: Oral  Oral Oral  SpO2: 98% 95% 96% 94%  Weight: 67.8 kg     Height:        Intake/Output Summary (Last 24 hours) at 09/11/2019 0825 Last data filed at 09/11/2019 0600 Gross per 24 hour  Intake 360 ml  Output 2300 ml  Net -1940 ml   Filed Weights   09/10/19 0450 09/10/19 1320 09/10/19 1726  Weight: 70.8 kg 70.7 kg 67.8 kg     Examination: General exam: Appears calm and comfortable  Respiratory system: Clear to auscultation. Respiratory effort normal. Cardiovascular system: S1 & S2 heard, RRR. No pedal edema. Central nervous system: Alert and oriented. Speech clear  Extremities: Symmetric in appearance bilaterally  Skin: No rashes, lesions or ulcers on exposed skin    Data Reviewed: I have personally reviewed following labs and imaging studies  CBC: Recent Labs  Lab 09/05/19 0720 09/07/19 0353 09/08/19 0543 09/09/19 0218 09/11/19 0553  WBC 10.6* 10.2 8.8 10.1 8.4  HGB 8.8* 9.5* 10.1* 10.8* 9.5*  HCT 27.1* 29.9* 32.6* 34.9* 29.9*  MCV 104.6* 103.1* 107.2* 105.1* 101.7*  PLT 264 279 279 305 0000000   Basic Metabolic Panel: Recent Labs  Lab 09/07/19 0353 09/08/19 0543 09/09/19 0218 09/10/19 0555 09/11/19 0553  NA 131* 138 139 135 139  K 5.1 4.8 5.3* 5.2* 4.7  CL 94* 99 99 99 98  CO2 25 27 25 22 26   GLUCOSE 132* 108* 163* 133* 119*  BUN 52* 25* 42* 59* 34*  CREATININE 7.67* 5.54* 7.50* 9.17* 6.62*  CALCIUM 8.5* 8.3* 8.9 8.6* 8.5*  PHOS 2.9 3.2 3.7 3.8 3.7   GFR: Estimated Creatinine Clearance: 10.5 mL/min (A) (by C-G formula based on SCr of 6.62 mg/dL (H)). Liver Function Tests: Recent Labs  Lab 09/07/19 0353 09/08/19 0543 09/09/19 0218 09/10/19 0555 09/11/19 0553  ALBUMIN 2.3* 2.3* 2.6* 2.4* 2.3*   No results for input(s): LIPASE, AMYLASE in the last 168 hours. No results for input(s): AMMONIA in the last 168 hours. Coagulation Profile: Recent Labs  Lab 09/07/19 0353 09/08/19 0543 09/09/19 0218  09/10/19 0555 09/11/19 0553  INR 2.1* 2.3* 2.5* 3.4* 3.2*   Cardiac Enzymes: No results for input(s): CKTOTAL, CKMB, CKMBINDEX, TROPONINI in the last 168 hours. BNP (last 3 results) No results for input(s): PROBNP in the last 8760 hours. HbA1C: No results for input(s): HGBA1C in the last 72 hours. CBG: Recent Labs  Lab 09/10/19 0704 09/10/19 1132 09/10/19 1817 09/10/19 2104 09/11/19 0735  GLUCAP 155* 163* 146* 154* 113*   Lipid Profile: No results for input(s): CHOL, HDL, LDLCALC, TRIG, CHOLHDL, LDLDIRECT in the last 72 hours. Thyroid Function Tests: No results for input(s): TSH, T4TOTAL, FREET4, T3FREE, THYROIDAB in the last 72 hours. Anemia Panel: No results for input(s): VITAMINB12, FOLATE, FERRITIN, TIBC, IRON, RETICCTPCT in the last 72 hours. Sepsis Labs: Recent Labs  Lab 09/10/19 0846 09/11/19 0553  PROCALCITON 1.16 1.74    Recent Results (from the past 240 hour(s))  Culture,  blood (routine x 2)     Status: None (Preliminary result)   Collection Time: 09/07/19  5:55 PM   Specimen: BLOOD LEFT HAND  Result Value Ref Range Status   Specimen Description BLOOD LEFT HAND  Final   Special Requests   Final    BOTTLES DRAWN AEROBIC AND ANAEROBIC Blood Culture adequate volume   Culture NO GROWTH 3 DAYS  Final   Report Status PENDING  Incomplete  Culture, blood (routine x 2)     Status: None (Preliminary result)   Collection Time: 09/07/19  6:04 PM   Specimen: BLOOD LEFT HAND  Result Value Ref Range Status   Specimen Description BLOOD LEFT HAND  Final   Special Requests   Final    BOTTLES DRAWN AEROBIC ONLY Blood Culture adequate volume   Culture NO GROWTH 3 DAYS  Final   Report Status PENDING  Incomplete      Radiology Studies: DG Chest 2 View  Result Date: 09/10/2019 CLINICAL DATA:  Fever for 1 day EXAM: CHEST - 2 VIEW COMPARISON:  07/30/2019, 07/24/2019 FINDINGS: Frontal and lateral views of the chest demonstrate chronic elevation left hemidiaphragm. Diffuse  interstitial opacities are again noted, with slight improvement in the bibasilar ground-glass airspace disease seen previously. No effusion or pneumothorax. No acute bony abnormalities. IMPRESSION: 1. Persistent but slightly improving bibasilar ground-glass airspace disease consistent with multifocal pneumonia. Electronically Signed   By: Randa Ngo M.D.   On: 09/10/2019 09:00      Scheduled Meds: .  stroke: mapping our early stages of recovery book   Does not apply Once  . allopurinol  100 mg Oral Daily  . aspirin  81 mg Oral Daily  . atorvastatin  40 mg Oral Daily  . Chlorhexidine Gluconate Cloth  6 each Topical Q0600  . Chlorhexidine Gluconate Cloth  6 each Topical Q0600  . darbepoetin (ARANESP) injection - DIALYSIS  150 mcg Intravenous Q Tue-HD  . feeding supplement (ENSURE ENLIVE)  237 mL Oral TID BM  . feeding supplement (PRO-STAT SUGAR FREE 64)  30 mL Oral BID  . insulin aspart  0-6 Units Subcutaneous TID WC  . insulin aspart  2 Units Subcutaneous TID WC  . metoCLOPramide  5 mg Oral BID WC  . metoprolol succinate  25 mg Oral Daily  . multivitamin  1 tablet Oral QHS  . pantoprazole  40 mg Oral Daily  . predniSONE  5 mg Oral Q breakfast  . sodium chloride flush  3 mL Intravenous Q12H  . Warfarin - Pharmacist Dosing Inpatient   Does not apply q1800   Continuous Infusions: . cefTRIAXone (ROCEPHIN)  IV       LOS: 43 days      Time spent: 20 minutes   Dessa Phi, DO [facility] 09/11/2019, 8:25 AM   Available via Epic secure chat 7am-7pm After these hours, please refer to coverage provider listed on [email or web address]

## 2019-09-12 LAB — RENAL FUNCTION PANEL
Albumin: 2.4 g/dL — ABNORMAL LOW (ref 3.5–5.0)
Albumin: 2.3 g/dL — ABNORMAL LOW (ref 3.5–5.0)
Anion gap: 14 (ref 5–15)
Anion gap: 13 (ref 5–15)
BUN: 48 mg/dL — ABNORMAL HIGH (ref 8–23)
BUN: 50 mg/dL — ABNORMAL HIGH (ref 8–23)
CO2: 24 mmol/L (ref 22–32)
CO2: 23 mmol/L (ref 22–32)
Calcium: 8.8 mg/dL — ABNORMAL LOW (ref 8.9–10.3)
Calcium: 8.7 mg/dL — ABNORMAL LOW (ref 8.9–10.3)
Chloride: 97 mmol/L — ABNORMAL LOW (ref 98–111)
Chloride: 99 mmol/L (ref 98–111)
Creatinine, Ser: 8.53 mg/dL — ABNORMAL HIGH (ref 0.61–1.24)
Creatinine, Ser: 8.74 mg/dL — ABNORMAL HIGH (ref 0.61–1.24)
GFR calc Af Amer: 7 mL/min — ABNORMAL LOW (ref >60)
GFR calc Af Amer: 7 mL/min — ABNORMAL LOW (ref >60)
GFR calc non Af Amer: 6 mL/min — ABNORMAL LOW (ref >60)
GFR calc non Af Amer: 6 mL/min — ABNORMAL LOW (ref >60)
Glucose, Bld: 90 mg/dL (ref 70–99)
Glucose, Bld: 80 mg/dL (ref 70–99)
Phosphorus: 4.4 mg/dL (ref 2.5–4.6)
Phosphorus: 4.3 mg/dL (ref 2.5–4.6)
Potassium: 5.3 mmol/L — ABNORMAL HIGH (ref 3.5–5.1)
Potassium: 5.2 mmol/L — ABNORMAL HIGH (ref 3.5–5.1)
Sodium: 135 mmol/L (ref 135–145)
Sodium: 135 mmol/L (ref 135–145)

## 2019-09-12 LAB — CBC WITH DIFFERENTIAL/PLATELET
Abs Immature Granulocytes: 0.06 10*3/uL (ref 0.00–0.07)
Basophils Absolute: 0.1 10*3/uL (ref 0.0–0.1)
Basophils Relative: 1 %
Eosinophils Absolute: 0.1 10*3/uL (ref 0.0–0.5)
Eosinophils Relative: 1 %
HCT: 30.9 % — ABNORMAL LOW (ref 39.0–52.0)
Hemoglobin: 9.7 g/dL — ABNORMAL LOW (ref 13.0–17.0)
Immature Granulocytes: 1 %
Lymphocytes Relative: 13 %
Lymphs Abs: 1.3 10*3/uL (ref 0.7–4.0)
MCH: 32.2 pg (ref 26.0–34.0)
MCHC: 31.4 g/dL (ref 30.0–36.0)
MCV: 102.7 fL — ABNORMAL HIGH (ref 80.0–100.0)
Monocytes Absolute: 0.8 10*3/uL (ref 0.1–1.0)
Monocytes Relative: 8 %
Neutro Abs: 7.7 10*3/uL (ref 1.7–7.7)
Neutrophils Relative %: 76 %
Platelets: 262 10*3/uL (ref 150–400)
RBC: 3.01 MIL/uL — ABNORMAL LOW (ref 4.22–5.81)
RDW: 17.6 % — ABNORMAL HIGH (ref 11.5–15.5)
WBC: 10 10*3/uL (ref 4.0–10.5)
nRBC: 0 % (ref 0.0–0.2)

## 2019-09-12 LAB — CULTURE, BLOOD (ROUTINE X 2)
Culture: NO GROWTH
Culture: NO GROWTH
Special Requests: ADEQUATE
Special Requests: ADEQUATE

## 2019-09-12 LAB — GLUCOSE, CAPILLARY
Glucose-Capillary: 101 mg/dL — ABNORMAL HIGH (ref 70–99)
Glucose-Capillary: 297 mg/dL — ABNORMAL HIGH (ref 70–99)
Glucose-Capillary: 306 mg/dL — ABNORMAL HIGH (ref 70–99)
Glucose-Capillary: 207 mg/dL — ABNORMAL HIGH (ref 70–99)

## 2019-09-12 LAB — PROTIME-INR
INR: 3.2 — ABNORMAL HIGH (ref 0.8–1.2)
Prothrombin Time: 32.3 seconds — ABNORMAL HIGH (ref 11.4–15.2)

## 2019-09-12 MED ORDER — SODIUM CHLORIDE 0.9 % IV SOLN: INTRAVENOUS | Status: DC | PRN

## 2019-09-12 MED ORDER — SODIUM CHLORIDE 0.9 % IV BOLUS
500.0000 mL | Freq: Once | INTRAVENOUS | Status: AC
  Administered 2019-09-12: 500 mL via INTRAVENOUS

## 2019-09-12 MED ORDER — WARFARIN SODIUM 2 MG PO TABS
2.0000 mg | ORAL_TABLET | Freq: Once | ORAL | Status: AC
  Administered 2019-09-12: 2 mg via ORAL
  Filled 2019-09-12: qty 1

## 2019-09-12 NOTE — Progress Notes (Signed)
OT Cancellation Note  Patient Details Name: Jonathan [name]. MRN: [id number] DOB: 1951/02/09   Cancelled Treatment:    Reason Eval/Treat Not Completed: Patient at procedure or test/ unavailable(HD)   Will attempt to see as pt availability allows.  Simonne Come 09/12/2019, 7:52 AM

## 2019-09-12 NOTE — Progress Notes (Signed)
Subjective:  Seen on HD, no complaints Tm 100.3 overnight  Objective Vital signs in last 24 hours: Vitals:   09/12/19 0718 09/12/19 0720 09/12/19 0730 09/12/19 0800  BP: (!) 178/90 (!) 171/91 (!) 161/73 (!) 153/53  Pulse: 96 94 93 93  Resp:      Temp:      TempSrc:      SpO2:      Weight:      Height:       Weight change: -0.8 kg  Physical Exam General:Alert NAD , chronically ill Male Heart: RRR, no M,R, G Lungs:a few scattered rhonchi, no rales, unlabored Abdomen: soft NT, ND Extremities: no LE edema Dialysis Access: right upper AVF+ bruit with needles in  Dialysis Orders: TTSG/O -[facility] when COVID 19 negative 4 hr 15 min 180NRE 400/800 80.5 kg 2.0 K/ 2.0 Ca R AVF/RIJ TDC -Heparin 8000 units IV TIW -Mircera 75 mcg IV q 2 weeks (last dose 07/25/19) -Venofer 50 mg IV weekly -Parsabiv 7.5 mg IV TIW   Problem/Plan: 1.Acute COVID-19 pneumonia - s/p remdesivir &Decadron. Per primary. Now out of 21 day isolation window but still required to have 2 negative COVID 19 test before he can return to [facility]- last + 1/14. Not retested since. Pt can dialyze at [facility] until COVID tests negative.   Fever now decr  with Admit team checking CXR= Multifocal PNA  woth slightly improved appearance  Rocephin started  / UA cs pend    BC  1/30 for fever no growth  So far /  2.ESRD- on HD TTS. K 4.7  .TDC removed1/26. if time needs to be shortened - it shouldn't be <3.5 - HD today. 3. Anemiaof CKD-Hgb 10.8. 9.5 9.7.ContinueAranesp to 153mcg qwk. Transfuse prn.tsat 34%. 4. Secondary hyperparathyroidism-Ca/P ok. No binders or VDRA. No parsabiv because not on formulary.  5.HTN/volume-vol ok, 2.0 l uf today; Well below EDW, will need to lower at d/c 6. Nutrition- Albumin low2.3. Dys 3 diet Continue renal vit/prostat. And ensure change to NePro   With some  K^  with ensure  7.LV thrombus-Heparin bridging tocoumadin. Per primary  8.Acute CVA/ hx of LV  thrombus (from prior admit)- etiology unclear. Per primary 9.Disposition: SNF placement when bed available- having placement difficulties.  Jannifer Hick MD [facility] Pager [phone]  Labs: Basic Metabolic Panel: Recent Labs  Lab 09/10/19 0555 09/11/19 0553 09/12/19 0353  NA 135 139 135  K 5.2* 4.7 5.3*  CL 99 98 97*  CO2 22 26 24   GLUCOSE 133* 119* 90  BUN 59* 34* 48*  CREATININE 9.17* 6.62* 8.53*  CALCIUM 8.6* 8.5* 8.8*  PHOS 3.8 3.7 4.4   Liver Function Tests: Recent Labs  Lab 09/10/19 0555 09/11/19 0553 09/12/19 0353  ALBUMIN 2.4* 2.3* 2.4*   No results for input(s): LIPASE, AMYLASE in the last 168 hours. No results for input(s): AMMONIA in the last 168 hours. CBC: Recent Labs  Lab 09/07/19 0353 09/07/19 0353 09/08/19 0543 09/08/19 0543 09/09/19 0218 09/11/19 0553 09/12/19 0805  WBC 10.2   < > 8.8   < > 10.1 8.4 10.0  NEUTROABS  --   --   --   --   --   --  7.7  HGB 9.5*   < > 10.1*   < > 10.8* 9.5* 9.7*  HCT 29.9*   < > 32.6*   < > 34.9* 29.9* 30.9*  MCV 103.1*  --  107.2*  --  105.1* 101.7* 102.7*  PLT 279   < >  279   < > 305 246 262   < > = values in this interval not displayed.   Cardiac Enzymes: No results for input(s): CKTOTAL, CKMB, CKMBINDEX, TROPONINI in the last 168 hours. CBG: Recent Labs  Lab 09/10/19 2104 09/11/19 0735 09/11/19 1124 09/11/19 1620 09/11/19 2038  GLUCAP 154* 113* 219* 173* 110*    Studies/Results: No results found. Medications: . cefTRIAXone (ROCEPHIN)  IV 1 g (09/11/19 0904)   .  stroke: mapping our early stages of recovery book   Does not apply Once  . allopurinol  100 mg Oral Daily  . aspirin  81 mg Oral Daily  . atorvastatin  40 mg Oral Daily  . Chlorhexidine Gluconate Cloth  6 each Topical Q0600  . Chlorhexidine Gluconate Cloth  6 each Topical Q0600  . darbepoetin (ARANESP) injection - DIALYSIS  150 mcg Intravenous Q Tue-HD  . feeding supplement (NEPRO CARB STEADY)  237 mL Oral BID BM   . feeding supplement (PRO-STAT SUGAR FREE 64)  30 mL Oral BID  . insulin aspart  0-6 Units Subcutaneous TID WC  . insulin aspart  2 Units Subcutaneous TID WC  . metoCLOPramide  5 mg Oral BID WC  . metoprolol succinate  25 mg Oral Daily  . multivitamin  1 tablet Oral QHS  . pantoprazole  40 mg Oral Daily  . predniSONE  5 mg Oral Q breakfast  . sodium chloride flush  3 mL Intravenous Q12H  . Warfarin - Pharmacist Dosing Inpatient   Does not apply [phone]

## 2019-09-12 NOTE — Progress Notes (Signed)
1135 am Patient came back from Dialysis with 2L fluid removal with BP 135/79.  1620 BP was checked with 88/41 and patient lethargic. Notified MD with orders of bolus of NSS 500 and rechecked bp after 30 min  was 107/64 .

## 2019-09-12 NOTE — Plan of Care (Signed)
  Problem: Health Behavior/Discharge Planning: Goal: Ability to manage health-related needs will improve Outcome: Progressing   Problem: Self-Care: Goal: Ability to participate in self-care as condition permits will improve Outcome: Progressing   

## 2019-09-12 NOTE — Progress Notes (Signed)
ANTICOAGULATION CONSULT NOTE - Follow Up Consult  Pharmacy Consult for Coumadin Indication: LV thrombus, CVA  No Known Allergies  Patient Measurements: Height: 5\' 11"  (180.3 cm) Weight: 154 lb 5.2 oz (70 kg) IBW/kg (Calculated) : 75.3  Vital Signs: Temp: 98.1 F (36.7 C) (02/04 0706) Temp Source: Oral (02/04 0706) BP: 145/77 (02/04 0830) Pulse Rate: 96 (02/04 0830)  Labs: Recent Labs    09/10/19 0555 09/10/19 0555 09/11/19 0553 09/12/19 0353 09/12/19 0805  HGB  --   --  9.5*  --  9.7*  HCT  --   --  29.9*  --  30.9*  PLT  --   --  246  --  262  LABPROT 34.7*  --  32.3* 32.3*  --   INR 3.4*  --  3.2* 3.2*  --   CREATININE 9.17*   < > 6.62* 8.53* 8.74*   < > = values in this interval not displayed.    Estimated Creatinine Clearance: 8.2 mL/min (A) (by C-G formula based on SCr of 8.74 mg/dL (H)).   Assessment:  Anticoag: Apix per MD> heparin gtt for h/o LV thrombus > warfarin  (also recent stroke noted)  INR slightly above goal at 3.2, but trending down after holding warfarin several days.  Goal of Therapy:  INR 2-3 Monitor platelets by anticoagulation protocol: Yes   Plan:  - Warfarin 2 mg x 1 tonight in anticipation of further decline in INR. - Daily INR.  Marguerite Olea, [facility] Clinical Pharmacist Phone [phone]  09/12/2019 9:38 AM

## 2019-09-12 NOTE — Progress Notes (Signed)
Occupational Therapy Treatment Patient Details Name: Jonathan [name]. MRN: [id number] DOB: 29-Mar-1951 Today's Date: 09/12/2019    History of present illness 69 y.o. 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 Diagnosed with COVID 6 days prior to coming to ED, where chest x-ray showed multifocal pneumonia. Admitted 07/30/19 for treatment of acute encephalopathy, difficulty swallowing and pneumonia.MRI of the brain showed small acute infarcts in the left pons, left occipital lob and right frontal lobe in addition to chronic infarcts    OT comments  Treatment session limited by fatigue.  Pt initially agreeable to therapy session and expressing desire to walk to sink with therapy.  Required mod assist +2 for sit > stand and pt unable to maintain standing balance.  Pt returned to sitting EOB as so fatigued from HD this AM.  Attempted to engage pt in self-care tasks in sitting EOB, however pt with increased Lt lean requiring mod-max assist to correct.  Pt with decreased initiation and difficulty following commands, therefore returned to supine.  Engaged in [place] with [place] for strengthening, ROM, and attention.  Pt will continue to benefit from OT acutely to decrease burden of care with ADLs and functional mobility.  Follow Up Recommendations  SNF;Supervision/Assistance - 24 hour    Equipment Recommendations  None recommended by OT       Precautions / Restrictions Precautions Precautions: Fall Restrictions Weight Bearing Restrictions: No       Mobility Bed Mobility Overal bed mobility: Needs Assistance Bed Mobility: Sit to Supine;Supine to Sit     Supine to sit: Min assist Sit to supine: Mod assist   General bed mobility comments: increased fatigue post HD impacting participation  Transfers Overall  transfer level: Needs assistance Equipment used: Rolling walker (2 wheeled) Transfers: Sit to/from Stand Sit to Stand: +2 physical assistance;Mod assist                  ADL either performed or assessed with clinical judgement   ADL Overall ADL's : Needs assistance/impaired     Grooming: Wash/dry face;Minimal assistance;Moderate assistance;Sitting Grooming Details (indicate cue type and reason): Decreased initiation and participation in during self-care tasks due to increased fatigue.  Completed bed mobility with min assist to EOB but once seated EOB pt with increased Lt lean requiring mod-max assist for sitting balance.  Pt washing face but then falling asleep requiring assist to complete task                             Functional mobility during ADLs: Moderate assistance General ADL Comments: Unsafe to progress OOB this session.  Completed sit > stand with plan to ambulate 10' to sink with PT.  Pt required mod assist +2 for sit > stand but unable to maintain standing therefore returned to sitting EOB and ultimately back in bed.               Cognition Arousal/Alertness: Awake/alert;Lethargic Behavior During Therapy: Flat affect Overall Cognitive Status: Impaired/Different from baseline Area of Impairment: Memory;Attention;Following commands;Safety/judgement;Problem solving;Awareness                   Current Attention Level: Sustained Memory: Decreased short-term memory Following Commands: Follows one step commands inconsistently;Follows one step commands with increased time Safety/Judgement: Decreased awareness of safety;Decreased awareness of deficits Awareness: Intellectual  Problem Solving: Slow processing;Requires verbal cues;Difficulty sequencing;Decreased initiation General Comments: Awake and answering questions with delayed response initially upon arrival, however fatiguing quickly with minimal response to cues and stimulus                    Pertinent Vitals/ Pain       Pain Assessment: No/denies pain         Frequency  Min 2X/week        Progress Toward Goals  OT Goals(current goals can now be found in the care plan section)  Progress towards OT goals: Progressing toward goals  Acute Rehab OT Goals Patient Stated Goal: none stated OT Goal Formulation: Patient unable to participate in goal setting Time For Goal Achievement: 09/18/19 Potential to Achieve Goals: Good  Plan Discharge plan remains appropriate;Frequency remains appropriate    Co-evaluation          OT goals addressed during session: ADL's and self-care      AM-PAC OT "6 Clicks" Daily Activity     Outcome Measure   Help from another person eating meals?: A Little Help from another person taking care of personal grooming?: A Lot Help from another person toileting, which includes using toliet, bedpan, or urinal?: A Lot Help from another person bathing (including washing, rinsing, drying)?: A Lot Help from another person to put on and taking off regular upper body clothing?: A Lot Help from another person to put on and taking off regular lower body clothing?: A Lot 6 Click Score: 13    End of Session Equipment Utilized During Treatment: Gait belt;Rolling walker  OT Visit Diagnosis: Unsteadiness on feet (R26.81);Other symptoms and signs involving cognitive function;Other abnormalities of gait and mobility (R26.89)   Activity Tolerance Patient limited by fatigue   Patient Left in bed;with call bell/phone within reach;with bed alarm set   Nurse Communication Mobility status        Time: 1340-1403 OT Time Calculation (min): 23 min  Charges: OT General Charges $OT Visit: 1 Visit OT Treatments $Self Care/Home Management : 8-22 mins    Simonne Come [phone] 09/12/2019, 3:52 PM

## 2019-09-12 NOTE — Progress Notes (Signed)
PROGRESS NOTE    Jonathan Serratore Sr.  [id number] DOB: February 04, 1951 DOA: 07/30/2019 PCP: Marton Redwood, MD    Brief Narrative:  Jonathan Lienhart Sr. 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 MRI brain showed small infarct in the left pons left occiput. Neurology saw the patient and felt this was secondary to left ventricular thrombus; he was started on heparin. He was diagnosed with Covid 07/24/2019. Patient has recurrently tested positive but is asymptomatic and this is probably positivity from his index infection in December.  New events last 24 hours / Subjective: Afebrile.  He denies having any major complaints at this time.    Assessment & Plan:   Principal Problem:   Acute respiratory failure due to COVID-19 [facility]) Active Problems:   Chronic hepatitis C without hepatic coma ([name])   Diabetes mellitus type 2, uncomplicated ([place])   History of coronary artery disease   Hypertension   ESRD (end stage renal disease) on dialysis ([place])   Acute respiratory failure with hypoxia ([name])   Cerebral thrombosis with cerebral infarction   Cerebral embolism with cerebral infarction  COVID-19 pneumonia -Initially tested positive in December 2020 and completed appropriate treatment.  No longer infectious and off of precautions at this point.  Assumed that patient could continue to test positive for 90 days from his initial positive test  Acute strokes left pons left occipital -Etiology unclear but though to be secondary to Covid versus LV thrombus -Continue ASA, lipitor, coumadin  -Appreciate neurology/stroke team -SNF placement recommended  ESRD/HD TTSa  -Nephrology following  LV thrombus -Coumadin per pharmacy  Prior cadaveric renal transplant -Continue  prednisone  Hyperglycemia, Pre-diabetes -Continue NovoLog  HTN -Continue toprol   UTI, not POA, not CAUTI  -Blood culture 1/30 after fever, negative to date -Urine culture NGTD -Rocephin x 5 days    DVT prophylaxis: Coumadin Code Status: Full Family Communication: None at bedside  Disposition Plan: Patient presented from home and currently PT recommendation is for SNF.  Discharge barrier is due to finding SNF placement as patient continues to test positive for Covid infection which was treated in December.   Consultants:   Neurology  Nephrology   Antimicrobials:  Anti-infectives (From admission, onward)   Start     Dose/Rate Route Frequency Ordered Stop   09/11/19 0830  cefTRIAXone (ROCEPHIN) 1 g in sodium chloride 0.9 % 100 mL IVPB     1 g 200 mL/hr over 30 Minutes Intravenous Daily 09/11/19 0726 09/16/19 0959   07/31/19 1000  remdesivir 100 mg in sodium chloride 0.9 % 100 mL IVPB     100 mg 200 mL/hr over 30 Minutes Intravenous Daily 07/30/19 2217 08/04/19 0748   07/31/19 0800  ceFEPIme (MAXIPIME) 1 g in sodium chloride 0.9 % 100 mL IVPB  Status:  Discontinued     1 g 200 mL/hr over 30 Minutes Intravenous Every 24 hours 07/31/19 0748 07/31/19 0816   07/31/19 0800  vancomycin (VANCOREADY) IVPB 1500 mg/300 mL  Status:  Discontinued     1,500 mg 150 mL/hr over 120 Minutes Intravenous  Once 07/31/19 0748 07/31/19 0816   07/31/19 0748  vancomycin variable dose per unstable renal function (pharmacist dosing)  Status:  Discontinued      Does not apply See admin instructions 07/31/19 0748 07/31/19 0816  07/30/19 2230  remdesivir 200 mg in sodium chloride 0.9% 250 mL IVPB     200 mg 580 mL/hr over 30 Minutes Intravenous Once 07/30/19 2217 07/31/19 0045        Objective: Vitals:   09/12/19 1000 09/12/19 1030 09/12/19 1050 09/12/19 1131  BP: (!) 108/52 (!) 105/30 134/68 135/79  Pulse: 95 97 100 (!) 110  Resp:   16 18  Temp:   98.8 F (37.1 C) 100.1 F (37.8  C)  TempSrc:   Oral Oral  SpO2:   98% 97%  Weight:   68.5 kg   Height:        Intake/Output Summary (Last 24 hours) at 09/12/2019 1612 Last data filed at 09/12/2019 1500 Gross per 24 hour  Intake 1003 ml  Output 1825 ml  Net -822 ml   Filed Weights   09/11/19 2042 09/12/19 0706 09/12/19 1050  Weight: 69.9 kg 70 kg 68.5 kg    Examination:  General exam: Appears calm and comfortable  Respiratory system: Occasional rhonchi, no wheezing. Respiratory effort normal. Cardiovascular system: S1 & S2 heard, RRR. No murmur. No pedal edema. Gastrointestinal system: Abdomen is nondistended, soft and nontender. Normal bowel sounds heard. Central nervous system: Oriented x2, has generalized weakness Extremities: Symmetric 5 x 5 power. Skin: No rashes Psychiatry: Mood & affect appropriate.     Data Reviewed: I have personally reviewed following labs and imaging studies  CBC: Recent Labs  Lab 09/07/19 0353 09/08/19 0543 09/09/19 0218 09/11/19 0553 09/12/19 0805  WBC 10.2 8.8 10.1 8.4 10.0  NEUTROABS  --   --   --   --  7.7  HGB 9.5* 10.1* 10.8* 9.5* 9.7*  HCT 29.9* 32.6* 34.9* 29.9* 30.9*  MCV 103.1* 107.2* 105.1* 101.7* 102.7*  PLT 279 279 305 246 [id number]   Basic Metabolic Panel: Recent Labs  Lab 09/09/19 0218 09/10/19 0555 09/11/19 0553 09/12/19 0353 09/12/19 0805  NA 139 135 139 135 135  K 5.3* 5.2* 4.7 5.3* 5.2*  CL 99 99 98 97* 99  CO2 25 22 26 24 23   GLUCOSE 163* 133* 119* 90 80  BUN 42* 59* 34* 48* 50*  CREATININE 7.50* 9.17* 6.62* 8.53* 8.74*  CALCIUM 8.9 8.6* 8.5* 8.8* 8.7*  PHOS 3.7 3.8 3.7 4.4 4.3   GFR: Estimated Creatinine Clearance: 8.1 mL/min (A) (by C-G formula based on SCr of 8.74 mg/dL (H)). Liver Function Tests: Recent Labs  Lab 09/09/19 0218 09/10/19 0555 09/11/19 0553 09/12/19 0353 09/12/19 0805  ALBUMIN 2.6* 2.4* 2.3* 2.4* 2.3*   No results for input(s): LIPASE, AMYLASE in the last 168 hours. No results for input(s): AMMONIA in the last  168 hours. Coagulation Profile: Recent Labs  Lab 09/08/19 0543 09/09/19 0218 09/10/19 0555 09/11/19 0553 09/12/19 0353  INR 2.3* 2.5* 3.4* 3.2* 3.2*   Cardiac Enzymes: No results for input(s): CKTOTAL, CKMB, CKMBINDEX, TROPONINI in the last 168 hours. BNP (last 3 results) No results for input(s): PROBNP in the last 8760 hours. HbA1C: No results for input(s): HGBA1C in the last 72 hours. CBG: Recent Labs  Lab 09/11/19 0735 09/11/19 1124 09/11/19 1620 09/11/19 2038 09/12/19 1127  GLUCAP 113* 219* 173* 110* 101*   Lipid Profile: No results for input(s): CHOL, HDL, LDLCALC, TRIG, CHOLHDL, LDLDIRECT in the last 72 hours. Thyroid Function Tests: No results for input(s): TSH, T4TOTAL, FREET4, T3FREE, THYROIDAB in the last 72 hours. Anemia Panel: No results for input(s): VITAMINB12, FOLATE, FERRITIN, TIBC, IRON, RETICCTPCT in the last 72 hours. Sepsis Labs:  Recent Labs  Lab 09/10/19 0846 09/11/19 0553  PROCALCITON 1.16 1.74    Recent Results (from the past 240 hour(s))  Culture, blood (routine x 2)     Status: None   Collection Time: 09/07/19  5:55 PM   Specimen: BLOOD LEFT HAND  Result Value Ref Range Status   Specimen Description BLOOD LEFT HAND  Final   Special Requests   Final    BOTTLES DRAWN AEROBIC AND ANAEROBIC Blood Culture adequate volume   Culture   Final    NO GROWTH 5 DAYS Performed at [facility], [address], [place], [place] [address]    Report Status 09/12/2019 FINAL  Final  Culture, blood (routine x 2)     Status: None   Collection Time: 09/07/19  6:04 PM   Specimen: BLOOD LEFT HAND  Result Value Ref Range Status   Specimen Description BLOOD LEFT HAND  Final   Special Requests   Final    BOTTLES DRAWN AEROBIC ONLY Blood Culture adequate volume   Culture   Final    NO GROWTH 5 DAYS Performed at [facility], [place] [address], [place], [place] [address]    Report Status 09/12/2019 FINAL  Final         Radiology Studies: No  results found.      Scheduled Meds: .  stroke: mapping our early stages of recovery book   Does not apply Once  . allopurinol  100 mg Oral Daily  . aspirin  81 mg Oral Daily  . atorvastatin  40 mg Oral Daily  . Chlorhexidine Gluconate Cloth  6 each Topical Q0600  . Chlorhexidine Gluconate Cloth  6 each Topical Q0600  . darbepoetin (ARANESP) injection - DIALYSIS  150 mcg Intravenous Q Tue-HD  . feeding supplement (NEPRO CARB STEADY)  237 mL Oral BID BM  . feeding supplement (PRO-STAT SUGAR FREE 64)  30 mL Oral BID  . insulin aspart  0-6 Units Subcutaneous TID WC  . insulin aspart  2 Units Subcutaneous TID WC  . metoCLOPramide  5 mg Oral BID WC  . metoprolol succinate  25 mg Oral Daily  . multivitamin  1 tablet Oral QHS  . pantoprazole  40 mg Oral Daily  . predniSONE  5 mg Oral Q breakfast  . sodium chloride flush  3 mL Intravenous Q12H  . warfarin  2 mg Oral ONCE-1800  . Warfarin - Pharmacist Dosing Inpatient   Does not apply q1800   Continuous Infusions: . cefTRIAXone (ROCEPHIN)  IV 1 g (09/12/19 1153)     LOS: 44 days    Time spent: 25 min    Yaakov Guthrie, MD [facility]   To contact the attending provider between 7A-7P or the covering provider during after hours 7P-7A, please log into the web site [email or web address] and access using universal [place] password for that web site. If you do not have the password, please call the hospital operator.  09/12/2019, 4:12 PM

## 2019-09-12 NOTE — Progress Notes (Signed)
PT Cancellation Note  Patient Details Name: Jonathan Heuberger Sr. MRN: [id number] DOB: Jun 01, 1951   Cancelled Treatment:    Reason Eval/Treat Not Completed: Patient at procedure or test/unavailable;Other (comment). Pt just completed HD session which prohibited visit earlier, then just returned tired and had a moment with struggling to complete his medication and thin liquids.  Will retry tomorrow.   Ramond Dial 09/12/2019, 12:24 PM   Mee Hives, PT MS Acute Rehab Dept. Number: [place] and [place]

## 2019-09-12 NOTE — Progress Notes (Signed)
Physical Therapy Treatment Patient Details Name: Jonathan Humpert Sr. MRN: [id number] DOB: 1951-06-22 Today's Date: 09/12/2019    History of Present Illness 69 y.o. 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 Diagnosed with COVID 6 days prior to coming to ED, where chest x-ray showed multifocal pneumonia. Admitted 07/30/19 for treatment of acute encephalopathy, difficulty swallowing and pneumonia.MRI of the brain showed small acute infarcts in the left pons, left occipital lob and right frontal lobe in addition to chronic infarcts     PT Comments    Pt was seen for mobility and was quite weak, unable to make a full transition to walk today.  Has choked on thin liquids with meds and had HD, so will try to see again in next one to two tx days to try again.  Frequency should remain up as pt is more participatory now.   Follow Up Recommendations  SNF     Equipment Recommendations  None recommended by PT    Recommendations for Other Services       Precautions / Restrictions Precautions Precautions: Fall Precaution Comments: lethargic from HD Restrictions Weight Bearing Restrictions: No    Mobility  Bed Mobility Overal bed mobility: Needs Assistance Bed Mobility: Supine to Sit;Sit to Supine     Supine to sit: Mod assist Sit to supine: Mod assist   General bed mobility comments: increased fatigue post HD impacting participation  Transfers Overall transfer level: Needs assistance Equipment used: Rolling walker (2 wheeled);1 person hand held assist Transfers: Sit to/from Stand Sit to Stand: +2 physical assistance;From elevated surface;+2 safety/equipment;Mod assist         General transfer comment: mod to power up and get to chair  Ambulation/Gait                 Stairs              Wheelchair Mobility    Modified Rankin (Stroke Patients Only)       Balance     Sitting balance-Leahy Scale: Fair       Standing balance-Leahy Scale: Poor                              Cognition Arousal/Alertness: Lethargic Behavior During Therapy: Flat affect Overall Cognitive Status: Impaired/Different from baseline Area of Impairment: Problem solving;Awareness;Safety/judgement;Following commands                 Orientation Level: Situation Current Attention Level: Selective Memory: Decreased short-term memory Following Commands: Follows one step commands inconsistently;Follows one step commands with increased time Safety/Judgement: Decreased awareness of safety Awareness: Intellectual Problem Solving: Slow processing;Requires verbal cues General Comments: Awake and answering questions with delayed response initially upon arrival, however fatiguing quickly with minimal response to cues and stimulus      Exercises      General Comments        Pertinent Vitals/Pain Pain Assessment: No/denies pain    Home Living                      Prior Function            PT Goals (current goals can now be found in the care plan section) Acute Rehab PT Goals Patient Stated Goal: none stated Progress towards PT goals: Progressing toward goals  Frequency    Min 2X/week      PT Plan Current plan remains appropriate    Co-evaluation PT/OT/SLP Co-Evaluation/Treatment: Yes Reason for Co-Treatment: Complexity of the patient's impairments (multi-system involvement);Necessary to address cognition/behavior during functional activity;For patient/therapist safety;To address functional/ADL transfers PT goals addressed during session: Mobility/safety with mobility;Proper use of DME;Balance OT goals addressed during session: ADL's and self-care      AM-PAC PT "6 Clicks" Mobility   Outcome Measure  Help needed turning from your back to  your side while in a flat bed without using bedrails?: A Lot Help needed moving from lying on your back to sitting on the side of a flat bed without using bedrails?: A Lot Help needed moving to and from a bed to a chair (including a wheelchair)?: A Lot Help needed standing up from a chair using your arms (e.g., wheelchair or bedside chair)?: A Lot Help needed to walk in hospital room?: A Lot Help needed climbing 3-5 steps with a railing? : Total 6 Click Score: 11    End of Session Equipment Utilized During Treatment: Gait belt Activity Tolerance: Patient limited by fatigue Patient left: with nursing/sitter in room;in bed;with bed alarm set Nurse Communication: Mobility status PT Visit Diagnosis: Unsteadiness on feet (R26.81);Other abnormalities of gait and mobility (R26.89);Muscle weakness (generalized) (M62.81);Ataxic gait (R26.0);Difficulty in walking, not elsewhere classified (R26.2)     Time: 1340-1403 PT Time Calculation (min) (ACUTE ONLY): 23 min  Charges:  $Therapeutic Activity: 8-22 mins                    Ramond Dial 09/12/2019, 4:34 PM   Mee Hives, PT MS Acute Rehab Dept. Number: [place] and [place]

## 2019-09-13 LAB — GLUCOSE, CAPILLARY
Glucose-Capillary: 118 mg/dL — ABNORMAL HIGH (ref 70–99)
Glucose-Capillary: 218 mg/dL — ABNORMAL HIGH (ref 70–99)
Glucose-Capillary: 222 mg/dL — ABNORMAL HIGH (ref 70–99)
Glucose-Capillary: 181 mg/dL — ABNORMAL HIGH (ref 70–99)

## 2019-09-13 LAB — PROTIME-INR
INR: 2.3 — ABNORMAL HIGH (ref 0.8–1.2)
Prothrombin Time: 25.5 seconds — ABNORMAL HIGH (ref 11.4–15.2)

## 2019-09-13 MED ORDER — INSULIN ASPART 100 UNIT/ML ~~LOC~~ SOLN
0.0000 [IU] | Freq: Three times a day (TID) | SUBCUTANEOUS | Status: DC
  Administered 2019-09-13 (×2): 5 [IU] via SUBCUTANEOUS
  Administered 2019-09-14: 3 [IU] via SUBCUTANEOUS
  Administered 2019-09-14 (×2): 2 [IU] via SUBCUTANEOUS
  Administered 2019-09-15: 3 [IU] via SUBCUTANEOUS
  Administered 2019-09-15: 5 [IU] via SUBCUTANEOUS
  Administered 2019-09-16: 8 [IU] via SUBCUTANEOUS
  Administered 2019-09-16: 5 [IU] via SUBCUTANEOUS
  Administered 2019-09-17: 2 [IU] via SUBCUTANEOUS
  Administered 2019-09-17: 15 [IU] via SUBCUTANEOUS
  Administered 2019-09-17: 5 [IU] via SUBCUTANEOUS
  Administered 2019-09-18: 2 [IU] via SUBCUTANEOUS
  Administered 2019-09-18: 5 [IU] via SUBCUTANEOUS
  Administered 2019-09-19 – 2019-09-20 (×4): 3 [IU] via SUBCUTANEOUS
  Administered 2019-09-21: 5 [IU] via SUBCUTANEOUS
  Administered 2019-09-22: 2 [IU] via SUBCUTANEOUS
  Administered 2019-09-22: 3 [IU] via SUBCUTANEOUS

## 2019-09-13 MED ORDER — INSULIN ASPART 100 UNIT/ML ~~LOC~~ SOLN
3.0000 [IU] | Freq: Three times a day (TID) | SUBCUTANEOUS | Status: DC
  Administered 2019-09-13 – 2019-09-22 (×21): 3 [IU] via SUBCUTANEOUS

## 2019-09-13 MED ORDER — CHLORHEXIDINE GLUCONATE CLOTH 2 % EX PADS
6.0000 | MEDICATED_PAD | Freq: Every day | CUTANEOUS | Status: DC
  Administered 2019-09-14 – 2019-09-22 (×7): 6 via TOPICAL

## 2019-09-13 MED ORDER — WARFARIN SODIUM 7.5 MG PO TABS
7.5000 mg | ORAL_TABLET | Freq: Once | ORAL | Status: AC
  Administered 2019-09-13: 7.5 mg via ORAL
  Filled 2019-09-13: qty 1

## 2019-09-13 MED ORDER — INSULIN ASPART 100 UNIT/ML ~~LOC~~ SOLN
0.0000 [IU] | Freq: Every day | SUBCUTANEOUS | Status: DC
  Administered 2019-09-15: 2 [IU] via SUBCUTANEOUS
  Administered 2019-09-18 – 2019-09-19 (×2): 3 [IU] via SUBCUTANEOUS
  Administered 2019-09-21: 4 [IU] via SUBCUTANEOUS

## 2019-09-13 NOTE — Progress Notes (Signed)
ANTICOAGULATION CONSULT NOTE - Follow Up Consult  Pharmacy Consult for Coumadin Indication: LV thrombus, CVA  No Known Allergies  Patient Measurements: Height: 5\' 11"  (180.3 cm) Weight: 153 lb (69.4 kg) IBW/kg (Calculated) : 75.3  Vital Signs: Temp: 99.1 F (37.3 C) (02/05 0430) Temp Source: Oral (02/05 0430) BP: 137/51 (02/05 0430) Pulse Rate: 92 (02/05 0430)  Labs: Recent Labs    09/11/19 0553 09/11/19 0553 09/12/19 0353 09/12/19 0805 09/13/19 0408  HGB 9.5*  --   --  9.7*  --   HCT 29.9*  --   --  30.9*  --   PLT 246  --   --  262  --   LABPROT 32.3*  --  32.3*  --  25.5*  INR 3.2*  --  3.2*  --  2.3*  CREATININE 6.62*   < > 8.53* 8.74* 5.62*   < > = values in this interval not displayed.    Estimated Creatinine Clearance: 12.7 mL/min (A) (by C-G formula based on SCr of 5.62 mg/dL (H)).   Assessment:  Anticoag: Apix per MD> heparin gtt for h/o LV thrombus > warfarin  (also recent stroke noted)  INR dropped 3.2> 2.3 due to holding warfarin x2 days. Patient required 6 - 7.5mg  to get to goal so will likely maintain at ~ 5mg . However, given holding/low dose x3 days and rapid downtrend, will require more to prevent further downtrend. On dysphagia diet with variable intake.   Goal of Therapy:  INR 2-3 Monitor platelets by anticoagulation protocol: Yes   Plan:  Warfarin 7.5mg  x1 tonight Once INR stops down trending, anticipate 4-5mg  daily will keep patient at goal  Daily INR   Benetta Spar, PharmD, BCPS, [place] Pharmacist  Please check AMION for all [place] phone numbers After 10:00 PM, call [place]

## 2019-09-13 NOTE — Progress Notes (Signed)
[facility] Progress Note   Subjective:   Patient seen and examined at bedside.  No new complaints. Afebrile overnight.    Objective Vitals:   09/12/19 2122 09/13/19 0430 09/13/19 0500 09/13/19 0848  BP: 124/80 (!) 137/51  124/70  Pulse: 95 92  89  Resp: 18 18  18   Temp: 98.6 F (37 C) 99.1 F (37.3 C)  97.6 F (36.4 C)  TempSrc: Oral Oral  Oral  SpO2: 100% 95%  95%  Weight: 69.4 kg  69.4 kg   Height:       Physical Exam General:NAD, chronically ill appearing male Heart:refused auscultation  Lungs:refused auscultation, nml WOB on RA Extremities:no LE edema Dialysis Access: RU AVF +thrill  Filed Weights   09/12/19 1050 09/12/19 2122 09/13/19 0500  Weight: 68.5 kg 69.4 kg 69.4 kg    Intake/Output Summary (Last 24 hours) at 09/13/2019 1257 Last data filed at 09/13/2019 0900 Gross per 24 hour  Intake 1260 ml  Output 100 ml  Net 1160 ml    Additional Objective Labs: Basic Metabolic Panel: Recent Labs  Lab 09/12/19 0353 09/12/19 0805 09/13/19 0408  NA 135 135 136  K 5.3* 5.2* 4.3  CL 97* 99 99  CO2 24 23 25   GLUCOSE 90 80 119*  BUN 48* 50* 26*  CREATININE 8.53* 8.74* 5.62*  CALCIUM 8.8* 8.7* 8.3*  PHOS 4.4 4.3 5.3*   Liver Function Tests: Recent Labs  Lab 09/12/19 0353 09/12/19 0805 09/13/19 0408  ALBUMIN 2.4* 2.3* 2.2*   CBC: Recent Labs  Lab 09/07/19 0353 09/07/19 0353 09/08/19 0543 09/08/19 0543 09/09/19 0218 09/11/19 0553 09/12/19 0805  WBC 10.2   < > 8.8   < > 10.1 8.4 10.0  NEUTROABS  --   --   --   --   --   --  7.7  HGB 9.5*   < > 10.1*   < > 10.8* 9.5* 9.7*  HCT 29.9*   < > 32.6*   < > 34.9* 29.9* 30.9*  MCV 103.1*  --  107.2*  --  105.1* 101.7* 102.7*  PLT 279   < > 279   < > 305 246 262   < > = values in this interval not displayed.   CBG: Recent Labs  Lab 09/12/19 1614 09/12/19 1705 09/12/19 2123 09/13/19 0648 09/13/19 1129  GLUCAP 297* 306* 207* 118* 218*    Medications: . sodium chloride    .  cefTRIAXone (ROCEPHIN)  IV 1 g (09/13/19 1007)   .  stroke: mapping our early stages of recovery book   Does not apply Once  . allopurinol  100 mg Oral Daily  . aspirin  81 mg Oral Daily  . atorvastatin  40 mg Oral Daily  . Chlorhexidine Gluconate Cloth  6 each Topical Q0600  . Chlorhexidine Gluconate Cloth  6 each Topical Q0600  . darbepoetin (ARANESP) injection - DIALYSIS  150 mcg Intravenous Q Tue-HD  . feeding supplement (NEPRO CARB STEADY)  237 mL Oral BID BM  . feeding supplement (PRO-STAT SUGAR FREE 64)  30 mL Oral BID  . insulin aspart  0-15 Units Subcutaneous TID WC  . insulin aspart  0-5 Units Subcutaneous QHS  . insulin aspart  3 Units Subcutaneous TID WC  . metoCLOPramide  5 mg Oral BID WC  . multivitamin  1 tablet Oral QHS  . pantoprazole  40 mg Oral Daily  . predniSONE  5 mg Oral Q breakfast  . sodium chloride flush  3  mL Intravenous Q12H  . warfarin  7.5 mg Oral ONCE-1800  . Warfarin - Pharmacist Dosing Inpatient   Does not apply q1800    Dialysis Orders: TTSG/O -[facility] when COVID 19 negative 4 hr 15 min 180NRE 400/800 80.5 kg 2.0 K/ 2.0 Ca R AVF/RIJ TDC -Heparin 8000 units IV TIW -Mircera 75 mcg IV q 2 weeks (last dose 07/25/19) -Venofer 50 mg IV weekly -Parsabiv 7.5 mg IV TIW  Assessment/Plan: 1.Acute COVID-19 pneumonia - s/p remdesivir &Decadron. Per primary. Now out of 21 day isolation window but still required to have 2 negative COVID 19 test before he can return to [facility]- last + 1/14. Not retested since. Pt can dialyze at [facility] until COVID tests negative. 2. Fever -Afebrile overnight.  CXR on 2/2 w/slightly improved ground glass opacities thought to represent multifocal PNA - Rocephin started. BC w/ NGTD 2.ESRD- on HD TTS. K 4.3.TDC removed1/26. Orders written for HD tomorrow per regular schedule.if time needs to be shortened - it shouldn't be <3.5 3. Anemiaof CKD-Hgb 9.7. ContinueAranesp to 143mcg qwk. Transfuse prn.tsat 34%. 4.  Secondary hyperparathyroidism-Ca/P ok. No binders or VDRA. No parsabiv because not on formulary.  5.HTN/volume-Does not appear volume overloaded.  Blood pressure well controlled. Well below EDW, will need to lower at d/c 6. Nutrition- Albumin low2.3. Dys 3 diet Continue renal vit/prostat. And ensure change to NePro   With some  K^  with ensure  7.LV thrombus-Heparin bridging tocoumadin. 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 Per primary 9.Disposition: SNF placement when bed available- having placement difficulties.  Jen Mow, PA-C [facility] Pager: [phone] 09/13/2019,12:57 PM  LOS: 45 days

## 2019-09-13 NOTE — Plan of Care (Signed)
  Problem: Self-Care: Goal: Ability to participate in self-care as condition permits will improve Outcome: Progressing   Problem: Nutrition: Goal: Risk of aspiration will decrease Outcome: Progressing   

## 2019-09-13 NOTE — Progress Notes (Signed)
PROGRESS NOTE    Jonathan Blyther Sr.  [id number] DOB: August 21, 1950 DOA: 07/30/2019 PCP: Marton Redwood, MD    Brief Narrative:  Jonathan Vanlenten Sr. 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 MRI brain showed small infarct in the left pons left occiput. Neurology saw the patient and felt this was secondary to left ventricular thrombus; he was started on heparin. He was diagnosed with Covid 07/24/2019. Patient has recurrently tested positive but is asymptomatic and this is probably positivity from his index infection in December.  New events last 24 hours / Subjective: No acute events. No acute complains, pt doesn't care who am I and what I have to offer him.    Assessment & Plan:   Principal Problem:   Acute respiratory failure due to COVID-19 [facility]) Active Problems:   Chronic hepatitis C without hepatic coma ([name])   Diabetes mellitus type 2, uncomplicated ([place])   History of coronary artery disease   Hypertension   ESRD (end stage renal disease) on dialysis ([place])   Acute respiratory failure with hypoxia ([name])   Cerebral thrombosis with cerebral infarction   Cerebral embolism with cerebral infarction  COVID-19 pneumonia -Initially tested positive in December 2020 and completed appropriate treatment.  No longer infectious and off of precautions at this point.  Assumed that patient could continue to test positive for 90 days from his initial positive test  Acute strokes left pons left occipital -Etiology unclear but though to be secondary to Covid versus LV thrombus -Continue ASA, lipitor, coumadin  -Appreciate neurology/stroke team -SNF placement recommended  ESRD/HD TTSa  -Nephrology following  LV thrombus -Coumadin per pharmacy  Prior cadaveric renal  transplant -Continue prednisone  Hyperglycemia, Pre-diabetes -Continue NovoLog  HTN -Continue toprol   UTI, not POA, not CAUTI  -Blood culture 1/30 after fever, negative to date -Urine culture NGTD -Rocephin x 5 days    DVT prophylaxis: Coumadin Code Status: Full Family Communication: None at bedside  Disposition Plan: Patient presented from home and currently PT recommendation is for SNF.  Discharge barrier is due to finding SNF placement as patient continues to test positive for Covid infection which was treated in December.   Consultants:   Neurology  Nephrology   Antimicrobials:  Anti-infectives (From admission, onward)   Start     Dose/Rate Route Frequency Ordered Stop   09/11/19 0830  cefTRIAXone (ROCEPHIN) 1 g in sodium chloride 0.9 % 100 mL IVPB     1 g 200 mL/hr over 30 Minutes Intravenous Daily 09/11/19 0726 09/16/19 0959   07/31/19 1000  remdesivir 100 mg in sodium chloride 0.9 % 100 mL IVPB     100 mg 200 mL/hr over 30 Minutes Intravenous Daily 07/30/19 2217 08/04/19 0748   07/31/19 0800  ceFEPIme (MAXIPIME) 1 g in sodium chloride 0.9 % 100 mL IVPB  Status:  Discontinued     1 g 200 mL/hr over 30 Minutes Intravenous Every 24 hours 07/31/19 0748 07/31/19 0816   07/31/19 0800  vancomycin (VANCOREADY) IVPB 1500 mg/300 mL  Status:  Discontinued     1,500 mg 150 mL/hr over 120 Minutes Intravenous  Once 07/31/19 0748 07/31/19 0816   07/31/19 0748  vancomycin variable dose per unstable renal function (pharmacist dosing)  Status:  Discontinued      Does not apply See  admin instructions 07/31/19 0748 07/31/19 0816   07/30/19 2230  remdesivir 200 mg in sodium chloride 0.9% 250 mL IVPB     200 mg 580 mL/hr over 30 Minutes Intravenous Once 07/30/19 2217 07/31/19 0045        Objective: Vitals:   09/13/19 0430 09/13/19 0500 09/13/19 0848 09/13/19 1751  BP: (!) 137/51  124/70 (!) 157/81  Pulse: 92  89 100  Resp: 18  18 18   Temp: 99.1 F (37.3 C)  97.6 F  (36.4 C) 98.5 F (36.9 C)  TempSrc: Oral  Oral   SpO2: 95%  95% 98%  Weight:  69.4 kg    Height:        Intake/Output Summary (Last 24 hours) at 09/13/2019 2038 Last data filed at 09/13/2019 1800 Gross per 24 hour  Intake 820 ml  Output 100 ml  Net 720 ml   Filed Weights   09/12/19 1050 09/12/19 2122 09/13/19 0500  Weight: 68.5 kg 69.4 kg 69.4 kg    Examination:  General exam: Appears calm and comfortable  Respiratory system: Occasional rhonchi, no wheezing. Respiratory effort normal. Cardiovascular system: S1 & S2 heard, RRR. No murmur. No pedal edema. Gastrointestinal system: Abdomen is nondistended, soft and nontender. Normal bowel sounds heard. Central nervous system: Oriented x2, has generalized weakness Extremities: Symmetric 5 x 5 power. Skin: No rashes Psychiatry: Mood & affect appropriate.     Data Reviewed: I have personally reviewed following labs and imaging studies  CBC: Recent Labs  Lab 09/07/19 0353 09/08/19 0543 09/09/19 0218 09/11/19 0553 09/12/19 0805  WBC 10.2 8.8 10.1 8.4 10.0  NEUTROABS  --   --   --   --  7.7  HGB 9.5* 10.1* 10.8* 9.5* 9.7*  HCT 29.9* 32.6* 34.9* 29.9* 30.9*  MCV 103.1* 107.2* 105.1* 101.7* 102.7*  PLT 279 279 305 246 [id number]   Basic Metabolic Panel: Recent Labs  Lab 09/10/19 0555 09/11/19 0553 09/12/19 0353 09/12/19 0805 09/13/19 0408  NA 135 139 135 135 136  K 5.2* 4.7 5.3* 5.2* 4.3  CL 99 98 97* 99 99  CO2 22 26 24 23 25   GLUCOSE 133* 119* 90 80 119*  BUN 59* 34* 48* 50* 26*  CREATININE 9.17* 6.62* 8.53* 8.74* 5.62*  CALCIUM 8.6* 8.5* 8.8* 8.7* 8.3*  PHOS 3.8 3.7 4.4 4.3 5.3*   GFR: Estimated Creatinine Clearance: 12.7 mL/min (A) (by C-G formula based on SCr of 5.62 mg/dL (H)). Liver Function Tests: Recent Labs  Lab 09/10/19 0555 09/11/19 0553 09/12/19 0353 09/12/19 0805 09/13/19 0408  ALBUMIN 2.4* 2.3* 2.4* 2.3* 2.2*   No results for input(s): LIPASE, AMYLASE in the last 168 hours. No results for  input(s): AMMONIA in the last 168 hours. Coagulation Profile: Recent Labs  Lab 09/09/19 0218 09/10/19 0555 09/11/19 0553 09/12/19 0353 09/13/19 0408  INR 2.5* 3.4* 3.2* 3.2* 2.3*   Cardiac Enzymes: No results for input(s): CKTOTAL, CKMB, CKMBINDEX, TROPONINI in the last 168 hours. BNP (last 3 results) No results for input(s): PROBNP in the last 8760 hours. HbA1C: No results for input(s): HGBA1C in the last 72 hours. CBG: Recent Labs  Lab 09/12/19 1705 09/12/19 2123 09/13/19 0648 09/13/19 1129 09/13/19 1625  GLUCAP 306* 207* 118* 218* 222*   Lipid Profile: No results for input(s): CHOL, HDL, LDLCALC, TRIG, CHOLHDL, LDLDIRECT in the last 72 hours. Thyroid Function Tests: No results for input(s): TSH, T4TOTAL, FREET4, T3FREE, THYROIDAB in the last 72 hours. Anemia Panel: No results for input(s): VITAMINB12, FOLATE, FERRITIN,  TIBC, IRON, RETICCTPCT in the last 72 hours. Sepsis Labs: Recent Labs  Lab 09/10/19 0846 09/11/19 0553  PROCALCITON 1.16 1.74    Recent Results (from the past 240 hour(s))  Culture, blood (routine x 2)     Status: None   Collection Time: 09/07/19  5:55 PM   Specimen: BLOOD LEFT HAND  Result Value Ref Range Status   Specimen Description BLOOD LEFT HAND  Final   Special Requests   Final    BOTTLES DRAWN AEROBIC AND ANAEROBIC Blood Culture adequate volume   Culture   Final    NO GROWTH 5 DAYS Performed at [facility], [address], [place], [place] [address]    Report Status 09/12/2019 FINAL  Final  Culture, blood (routine x 2)     Status: None   Collection Time: 09/07/19  6:04 PM   Specimen: BLOOD LEFT HAND  Result Value Ref Range Status   Specimen Description BLOOD LEFT HAND  Final   Special Requests   Final    BOTTLES DRAWN AEROBIC ONLY Blood Culture adequate volume   Culture   Final    NO GROWTH 5 DAYS Performed at [facility], [place] [address] [place], [place] [address]    Report Status 09/12/2019 FINAL  Final          Radiology Studies: No results found.      Scheduled Meds: .  stroke: mapping our early stages of recovery book   Does not apply Once  . allopurinol  100 mg Oral Daily  . aspirin  81 mg Oral Daily  . atorvastatin  40 mg Oral Daily  . [START ON 09/14/2019] Chlorhexidine Gluconate Cloth  6 each Topical Q0600  . darbepoetin (ARANESP) injection - DIALYSIS  150 mcg Intravenous Q Tue-HD  . feeding supplement (NEPRO CARB STEADY)  237 mL Oral BID BM  . feeding supplement (PRO-STAT SUGAR FREE 64)  30 mL Oral BID  . insulin aspart  0-15 Units Subcutaneous TID WC  . insulin aspart  0-5 Units Subcutaneous QHS  . insulin aspart  3 Units Subcutaneous TID WC  . metoCLOPramide  5 mg Oral BID WC  . multivitamin  1 tablet Oral QHS  . pantoprazole  40 mg Oral Daily  . predniSONE  5 mg Oral Q breakfast  . sodium chloride flush  3 mL Intravenous Q12H  . Warfarin - Pharmacist Dosing Inpatient   Does not apply q1800   Continuous Infusions: . sodium chloride    . cefTRIAXone (ROCEPHIN)  IV Stopped (09/13/19 1037)     LOS: 45 days    Time spent: 25 min    Berle Mull, MD [facility]   To contact the attending provider between 7A-7P or the covering provider during after hours 7P-7A, please log into the web site [email or web address] and access using universal [place] password for that web site. If you do not have the password, please call the hospital operator.  09/13/2019, 8:38 PM

## 2019-09-14 LAB — RENAL FUNCTION PANEL
Albumin: 2.2 g/dL — ABNORMAL LOW (ref 3.5–5.0)
Albumin: 2.1 g/dL — ABNORMAL LOW (ref 3.5–5.0)
Anion gap: 12 (ref 5–15)
Anion gap: 12 (ref 5–15)
BUN: 26 mg/dL — ABNORMAL HIGH (ref 8–23)
BUN: 40 mg/dL — ABNORMAL HIGH (ref 8–23)
CO2: 25 mmol/L (ref 22–32)
CO2: 24 mmol/L (ref 22–32)
Calcium: 8.3 mg/dL — ABNORMAL LOW (ref 8.9–10.3)
Calcium: 8.2 mg/dL — ABNORMAL LOW (ref 8.9–10.3)
Chloride: 99 mmol/L (ref 98–111)
Chloride: 99 mmol/L (ref 98–111)
Creatinine, Ser: 5.62 mg/dL — ABNORMAL HIGH (ref 0.61–1.24)
Creatinine, Ser: 7.52 mg/dL — ABNORMAL HIGH (ref 0.61–1.24)
GFR calc Af Amer: 11 mL/min — ABNORMAL LOW (ref >60)
GFR calc Af Amer: 8 mL/min — ABNORMAL LOW (ref >60)
GFR calc non Af Amer: 10 mL/min — ABNORMAL LOW (ref >60)
GFR calc non Af Amer: 7 mL/min — ABNORMAL LOW (ref >60)
Glucose, Bld: 119 mg/dL — ABNORMAL HIGH (ref 70–99)
Glucose, Bld: 149 mg/dL — ABNORMAL HIGH (ref 70–99)
Phosphorus: 5.3 mg/dL — ABNORMAL HIGH (ref 2.5–4.6)
Phosphorus: 4.6 mg/dL (ref 2.5–4.6)
Potassium: 4.3 mmol/L (ref 3.5–5.1)
Potassium: 4.5 mmol/L (ref 3.5–5.1)
Sodium: 136 mmol/L (ref 135–145)
Sodium: 135 mmol/L (ref 135–145)

## 2019-09-14 LAB — GLUCOSE, CAPILLARY
Glucose-Capillary: 125 mg/dL — ABNORMAL HIGH (ref 70–99)
Glucose-Capillary: 130 mg/dL — ABNORMAL HIGH (ref 70–99)
Glucose-Capillary: 168 mg/dL — ABNORMAL HIGH (ref 70–99)
Glucose-Capillary: 136 mg/dL — ABNORMAL HIGH (ref 70–99)
Glucose-Capillary: 136 mg/dL — ABNORMAL HIGH (ref 70–99)

## 2019-09-14 LAB — CBC
HCT: 30.1 % — ABNORMAL LOW (ref 39.0–52.0)
Hemoglobin: 9.2 g/dL — ABNORMAL LOW (ref 13.0–17.0)
MCH: 31.4 pg (ref 26.0–34.0)
MCHC: 30.6 g/dL (ref 30.0–36.0)
MCV: 102.7 fL — ABNORMAL HIGH (ref 80.0–100.0)
Platelets: 266 10*3/uL (ref 150–400)
RBC: 2.93 MIL/uL — ABNORMAL LOW (ref 4.22–5.81)
RDW: 17.7 % — ABNORMAL HIGH (ref 11.5–15.5)
WBC: 6.5 10*3/uL (ref 4.0–10.5)
nRBC: 0 % (ref 0.0–0.2)

## 2019-09-14 LAB — PROTIME-INR
INR: 2.1 — ABNORMAL HIGH (ref 0.8–1.2)
Prothrombin Time: 23.2 seconds — ABNORMAL HIGH (ref 11.4–15.2)

## 2019-09-14 MED ORDER — LIDOCAINE HCL (PF) 1 % IJ SOLN: 5.0000 mL | INTRAMUSCULAR | Status: DC | PRN

## 2019-09-14 MED ORDER — SODIUM CHLORIDE 0.9 % IV SOLN: 100.0000 mL | INTRAVENOUS | Status: DC | PRN

## 2019-09-14 MED ORDER — PENTAFLUOROPROP-TETRAFLUOROETH EX AERO: 1.0000 "application " | INHALATION_SPRAY | CUTANEOUS | Status: DC | PRN

## 2019-09-14 MED ORDER — WARFARIN SODIUM 6 MG PO TABS
6.0000 mg | ORAL_TABLET | Freq: Once | ORAL | Status: AC
  Administered 2019-09-14: 6 mg via ORAL
  Filled 2019-09-14: qty 1

## 2019-09-14 MED ORDER — ALTEPLASE 2 MG IJ SOLR: 2.0000 mg | Freq: Once | INTRAMUSCULAR | Status: DC | PRN

## 2019-09-14 MED ORDER — HEPARIN SODIUM (PORCINE) 1000 UNIT/ML DIALYSIS: 1000.0000 [IU] | INTRAMUSCULAR | Status: DC | PRN

## 2019-09-14 MED ORDER — HEPARIN SODIUM (PORCINE) 1000 UNIT/ML DIALYSIS: 20.0000 [IU]/kg | INTRAMUSCULAR | Status: DC | PRN

## 2019-09-14 MED ORDER — LIDOCAINE-PRILOCAINE 2.5-2.5 % EX CREA: 1.0000 "application " | TOPICAL_CREAM | CUTANEOUS | Status: DC | PRN

## 2019-09-14 NOTE — Progress Notes (Signed)
PROGRESS NOTE    Jonathan Baggott Sr.  [id number] DOB: 16-May-1951 DOA: 07/30/2019 PCP: Marton Redwood, MD    Brief Narrative:  Jonathan Spenner Sr. 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 MRI brain showed small infarct in the left pons left occiput. Neurology saw the patient and felt this was secondary to left ventricular thrombus; he was started on heparin. He was diagnosed with Covid 07/24/2019. Patient has recurrently tested positive but is asymptomatic and this is probably positivity from his index infection in December.  New events last 24 hours / Subjective: No complaints.  No acute events.  In a good mood today.   Assessment & Plan:   Principal Problem:   Acute respiratory failure due to COVID-19 [facility]) Active Problems:   Chronic hepatitis C without hepatic coma ([name])   Diabetes mellitus type 2, uncomplicated ([place])   History of coronary artery disease   Hypertension   ESRD (end stage renal disease) on dialysis ([place])   Acute respiratory failure with hypoxia ([name])   Cerebral thrombosis with cerebral infarction   Cerebral embolism with cerebral infarction  COVID-19 pneumonia -Initially tested positive in December 2020 and completed appropriate treatment.  No longer infectious and off of precautions at this point.  Assumed that patient could continue to test positive for 90 days from his initial positive test  Acute strokes left pons left occipital -Etiology unclear but though to be secondary to Covid versus LV thrombus -Continue ASA, lipitor, coumadin  -Appreciate neurology/stroke team -SNF placement recommended  ESRD/HD TTSa  -Nephrology following  LV thrombus -Coumadin per pharmacy  Prior cadaveric renal transplant -Continue  prednisone  Hyperglycemia, Pre-diabetes -Continue NovoLog  HTN -Continue toprol   UTI, not POA, not CAUTI  -Blood culture 1/30 after fever, negative to date -Urine culture NGTD -Rocephin x 5 days    DVT prophylaxis: Coumadin Code Status: Full Family Communication: None at bedside  Disposition Plan: Patient presented from home and currently PT recommendation is for SNF.  Discharge barrier is due to finding SNF placement as patient continues to test positive for Covid infection which was treated in December.   Consultants:   Neurology  Nephrology   Antimicrobials:  Anti-infectives (From admission, onward)   Start     Dose/Rate Route Frequency Ordered Stop   09/11/19 0830  cefTRIAXone (ROCEPHIN) 1 g in sodium chloride 0.9 % 100 mL IVPB     1 g 200 mL/hr over 30 Minutes Intravenous Daily 09/11/19 0726 09/16/19 0959   07/31/19 1000  remdesivir 100 mg in sodium chloride 0.9 % 100 mL IVPB     100 mg 200 mL/hr over 30 Minutes Intravenous Daily 07/30/19 2217 08/04/19 0748   07/31/19 0800  ceFEPIme (MAXIPIME) 1 g in sodium chloride 0.9 % 100 mL IVPB  Status:  Discontinued     1 g 200 mL/hr over 30 Minutes Intravenous Every 24 hours 07/31/19 0748 07/31/19 0816   07/31/19 0800  vancomycin (VANCOREADY) IVPB 1500 mg/300 mL  Status:  Discontinued     1,500 mg 150 mL/hr over 120 Minutes Intravenous  Once 07/31/19 0748 07/31/19 0816   07/31/19 0748  vancomycin variable dose per unstable renal function (pharmacist dosing)  Status:  Discontinued      Does not apply See admin instructions 07/31/19 0748 07/31/19 0816  07/30/19 2230  remdesivir 200 mg in sodium chloride 0.9% 250 mL IVPB     200 mg 580 mL/hr over 30 Minutes Intravenous Once 07/30/19 2217 07/31/19 0045        Objective: Vitals:   09/14/19 1400 09/14/19 1430 09/14/19 1500 09/14/19 1530  BP: (!) 146/74 109/76 99/86 (!) 132/105  Pulse: 93 71 98 (!) 102  Resp:      Temp:      TempSrc:      SpO2:      Weight:       Height:        Intake/Output Summary (Last 24 hours) at 09/14/2019 1621 Last data filed at 09/14/2019 1327 Gross per 24 hour  Intake 920 ml  Output 0 ml  Net 920 ml   Filed Weights   09/12/19 2122 09/13/19 0500 09/14/19 1327  Weight: 69.4 kg 69.4 kg 69 kg    Examination:  General exam: Appears calm and comfortable  Respiratory system: Occasional rhonchi, no wheezing. Respiratory effort normal. Cardiovascular system: S1 & S2 heard, RRR. No murmur. No pedal edema. Gastrointestinal system: Abdomen is nondistended, soft and nontender. Normal bowel sounds heard. Central nervous system: Oriented x2, has generalized weakness Extremities: Symmetric 5 x 5 power. Skin: No rashes Psychiatry: Mood & affect appropriate.     Data Reviewed: I have personally reviewed following labs and imaging studies  CBC: Recent Labs  Lab 09/08/19 0543 09/09/19 0218 09/11/19 0553 09/12/19 0805 09/14/19 1415  WBC 8.8 10.1 8.4 10.0 6.5  NEUTROABS  --   --   --  7.7  --   HGB 10.1* 10.8* 9.5* 9.7* 9.2*  HCT 32.6* 34.9* 29.9* 30.9* 30.1*  MCV 107.2* 105.1* 101.7* 102.7* 102.7*  PLT 279 305 246 262 [id number]   Basic Metabolic Panel: Recent Labs  Lab 09/11/19 0553 09/12/19 0353 09/12/19 0805 09/13/19 0408 09/14/19 0432  NA 139 135 135 136 135  K 4.7 5.3* 5.2* 4.3 4.5  CL 98 97* 99 99 99  CO2 26 24 23 25 24   GLUCOSE 119* 90 80 119* 149*  BUN 34* 48* 50* 26* 40*  CREATININE 6.62* 8.53* 8.74* 5.62* 7.52*  CALCIUM 8.5* 8.8* 8.7* 8.3* 8.2*  PHOS 3.7 4.4 4.3 5.3* 4.6   GFR: Estimated Creatinine Clearance: 9.4 mL/min (A) (by C-G formula based on SCr of 7.52 mg/dL (H)). Liver Function Tests: Recent Labs  Lab 09/11/19 0553 09/12/19 0353 09/12/19 0805 09/13/19 0408 09/14/19 0432  ALBUMIN 2.3* 2.4* 2.3* 2.2* 2.1*   No results for input(s): LIPASE, AMYLASE in the last 168 hours. No results for input(s): AMMONIA in the last 168 hours. Coagulation Profile: Recent Labs  Lab 09/10/19 0555  09/11/19 0553 09/12/19 0353 09/13/19 0408 09/14/19 0432  INR 3.4* 3.2* 3.2* 2.3* 2.1*   Cardiac Enzymes: No results for input(s): CKTOTAL, CKMB, CKMBINDEX, TROPONINI in the last 168 hours. BNP (last 3 results) No results for input(s): PROBNP in the last 8760 hours. HbA1C: No results for input(s): HGBA1C in the last 72 hours. CBG: Recent Labs  Lab 09/13/19 1625 09/13/19 2216 09/14/19 0636 09/14/19 0700 09/14/19 1120  GLUCAP 222* 181* 125* 130* 168*   Lipid Profile: No results for input(s): CHOL, HDL, LDLCALC, TRIG, CHOLHDL, LDLDIRECT in the last 72 hours. Thyroid Function Tests: No results for input(s): TSH, T4TOTAL, FREET4, T3FREE, THYROIDAB in the last 72 hours. Anemia Panel: No results for input(s): VITAMINB12, FOLATE, FERRITIN, TIBC, IRON, RETICCTPCT in the last 72 hours. Sepsis Labs: Recent Labs  Lab 09/10/19 0846 09/11/19  0553  PROCALCITON 1.16 1.74    Recent Results (from the past 240 hour(s))  Culture, blood (routine x 2)     Status: None   Collection Time: 09/07/19  5:55 PM   Specimen: BLOOD LEFT HAND  Result Value Ref Range Status   Specimen Description BLOOD LEFT HAND  Final   Special Requests   Final    BOTTLES DRAWN AEROBIC AND ANAEROBIC Blood Culture adequate volume   Culture   Final    NO GROWTH 5 DAYS Performed at [facility], [name] [place], [place] [address]    Report Status 09/12/2019 FINAL  Final  Culture, blood (routine x 2)     Status: None   Collection Time: 09/07/19  6:04 PM   Specimen: BLOOD LEFT HAND  Result Value Ref Range Status   Specimen Description BLOOD LEFT HAND  Final   Special Requests   Final    BOTTLES DRAWN AEROBIC ONLY Blood Culture adequate volume   Culture   Final    NO GROWTH 5 DAYS Performed at [facility], [place] [address], [place], [place] [address]    Report Status 09/12/2019 FINAL  Final         Radiology Studies: No results found.      Scheduled Meds: .  stroke: mapping our  early stages of recovery book   Does not apply Once  . allopurinol  100 mg Oral Daily  . aspirin  81 mg Oral Daily  . atorvastatin  40 mg Oral Daily  . Chlorhexidine Gluconate Cloth  6 each Topical Q0600  . darbepoetin (ARANESP) injection - DIALYSIS  150 mcg Intravenous Q Tue-HD  . feeding supplement (NEPRO CARB STEADY)  237 mL Oral BID BM  . feeding supplement (PRO-STAT SUGAR FREE 64)  30 mL Oral BID  . insulin aspart  0-15 Units Subcutaneous TID WC  . insulin aspart  0-5 Units Subcutaneous QHS  . insulin aspart  3 Units Subcutaneous TID WC  . metoCLOPramide  5 mg Oral BID WC  . multivitamin  1 tablet Oral QHS  . pantoprazole  40 mg Oral Daily  . predniSONE  5 mg Oral Q breakfast  . sodium chloride flush  3 mL Intravenous Q12H  . warfarin  6 mg Oral ONCE-1800  . Warfarin - Pharmacist Dosing Inpatient   Does not apply q1800   Continuous Infusions: . sodium chloride 10 mL/hr at 09/14/19 1008  . sodium chloride    . sodium chloride    . cefTRIAXone (ROCEPHIN)  IV Stopped (09/14/19 1039)     LOS: 46 days    Time spent: 25 min    Berle Mull, MD [facility]   To contact the attending provider between 7A-7P or the covering provider during after hours 7P-7A, please log into the web site [email or web address] and access using universal  password for that web site. If you do not have the password, please call the hospital operator.  09/14/2019, 4:21 PM

## 2019-09-14 NOTE — Progress Notes (Signed)
ANTICOAGULATION CONSULT NOTE - Follow Up Consult  Pharmacy Consult for Coumadin Indication: LV thrombus, CVA  No Known Allergies  Patient Measurements: Height: 5\' 11"  (180.3 cm) Weight: 153 lb (69.4 kg) IBW/kg (Calculated) : 75.3  Vital Signs: Temp: 99 F (37.2 C) (02/06 0634) Temp Source: Oral (02/06 0634) BP: 143/80 (02/06 0634) Pulse Rate: 92 (02/06 0634)  Labs: Recent Labs    09/12/19 0353 09/12/19 0353 09/12/19 0805 09/13/19 0408 09/14/19 0432  HGB  --   --  9.7*  --   --   HCT  --   --  30.9*  --   --   PLT  --   --  262  --   --   LABPROT 32.3*  --   --  25.5* 23.2*  INR 3.2*  --   --  2.3* 2.1*  CREATININE 8.53*   < > 8.74* 5.62* 7.52*   < > = values in this interval not displayed.    Estimated Creatinine Clearance: 9.5 mL/min (A) (by C-G formula based on SCr of 7.52 mg/dL (H)).   Assessment:  Anticoag: Apix per MD> heparin gtt for h/o LV thrombus > warfarin  (also recent stroke noted)  INR dropped 3.2> 2.3> 2.1 due to holding warfarin x2 days. Patient required 6 - 7.5mg  to get to goal so will likely maintain at ~ 5mg . However, given holding/low dose x3 days and rapid downtrend, will require more to prevent further downtrend. On dysphagia diet with variable intake.   Goal of Therapy:  INR 2-3 Monitor platelets by anticoagulation protocol: Yes   Plan:  Warfarin 6 mg x1 tonight Once INR stops down trending, anticipate 4-5mg  daily will keep patient at goal  Daily INR  Berenice Bouton, PharmD PGY1 Pharmacy Resident  Please check AMION for all [place] phone numbers After 10:00 PM, call [place] [phone]  09/14/2019 8:00 AM

## 2019-09-14 NOTE — Progress Notes (Signed)
[facility] Progress Note   Subjective:   Patient seen and examined at bedside.  Nurse reports he has been refusing meds this AM.  Encouraged patient to take his medications - he was aggravated and stated "I did take my meds. " No specific complaints.  Denies SOB, CP, n/v/d, and abdominal pain.   Objective Vitals:   09/13/19 2210 09/13/19 2344 09/14/19 0042 09/14/19 0634  BP: (!) 165/76   (!) 143/80  Pulse: 99   92  Resp: 16   16  Temp: (!) 101.4 F (38.6 C) (!) 101.1 F (38.4 C) 98.8 F (37.1 C) 99 F (37.2 C)  TempSrc: Oral Oral Oral Oral  SpO2: 93%   98%  Weight:      Height:       Physical Exam General:NAD, chronically ill appearing male, laying in bed Heart: refused auscultation Lungs:refused auscultation, nml WOB on RA Extremities:no LE edema Dialysis Access: RU AVF +thrill   Filed Weights   09/12/19 1050 09/12/19 2122 09/13/19 0500  Weight: 68.5 kg 69.4 kg 69.4 kg    Intake/Output Summary (Last 24 hours) at 09/14/2019 0956 Last data filed at 09/14/2019 0600 Gross per 24 hour  Intake 700 ml  Output 0 ml  Net 700 ml    Additional Objective Labs: Basic Metabolic Panel: Recent Labs  Lab 09/12/19 0805 09/13/19 0408 09/14/19 0432  NA 135 136 135  K 5.2* 4.3 4.5  CL 99 99 99  CO2 23 25 24   GLUCOSE 80 119* 149*  BUN 50* 26* 40*  CREATININE 8.74* 5.62* 7.52*  CALCIUM 8.7* 8.3* 8.2*  PHOS 4.3 5.3* 4.6   Liver Function Tests: Recent Labs  Lab 09/12/19 0805 09/13/19 0408 09/14/19 0432  ALBUMIN 2.3* 2.2* 2.1*   CBC: Recent Labs  Lab 09/08/19 0543 09/08/19 0543 09/09/19 0218 09/11/19 0553 09/12/19 0805  WBC 8.8   < > 10.1 8.4 10.0  NEUTROABS  --   --   --   --  7.7  HGB 10.1*   < > 10.8* 9.5* 9.7*  HCT 32.6*   < > 34.9* 29.9* 30.9*  MCV 107.2*  --  105.1* 101.7* 102.7*  PLT 279   < > 305 246 262   < > = values in this interval not displayed.   CBG: Recent Labs  Lab 09/13/19 1129 09/13/19 1625 09/13/19 2216 09/14/19 0636  09/14/19 0700  GLUCAP 218* 222* 181* 125* 130*    Lab Results  Component Value Date   INR 2.1 (H) 09/14/2019   INR 2.3 (H) 09/13/2019   INR 3.2 (H) 09/12/2019   Studies/Results: No results found.  Medications: . sodium chloride    . cefTRIAXone (ROCEPHIN)  IV Stopped (09/13/19 1037)   .  stroke: mapping our early stages of recovery book   Does not apply Once  . allopurinol  100 mg Oral Daily  . aspirin  81 mg Oral Daily  . atorvastatin  40 mg Oral Daily  . Chlorhexidine Gluconate Cloth  6 each Topical Q0600  . darbepoetin (ARANESP) injection - DIALYSIS  150 mcg Intravenous Q Tue-HD  . feeding supplement (NEPRO CARB STEADY)  237 mL Oral BID BM  . feeding supplement (PRO-STAT SUGAR FREE 64)  30 mL Oral BID  . insulin aspart  0-15 Units Subcutaneous TID WC  . insulin aspart  0-5 Units Subcutaneous QHS  . insulin aspart  3 Units Subcutaneous TID WC  . metoCLOPramide  5 mg Oral BID WC  . multivitamin  1  tablet Oral QHS  . pantoprazole  40 mg Oral Daily  . predniSONE  5 mg Oral Q breakfast  . sodium chloride flush  3 mL Intravenous Q12H  . warfarin  6 mg Oral ONCE-1800  . Warfarin - Pharmacist Dosing Inpatient   Does not apply q1800    Dialysis Orders: TTSG/O -[facility] when COVID 19 negative 4 hr 15 min 180NRE 400/800 80.5 kg 2.0 K/ 2.0 Ca R AVF/RIJ TDC -Heparin 8000 units IV TIW -Mircera 75 mcg IV q 2 weeks (last dose 07/25/19) -Venofer 50 mg IV weekly -Parsabiv 7.5 mg IV TIW  Assessment/Plan: 1.Acute COVID-19 pneumonia - s/p remdesivir &Decadron. Per primary. Now out of 21 day isolation window but still required to have 2 negative COVID 19 test before he can return to [facility]- last + 1/14. Not retested since. Pt can dialyze at [facility] until COVID tests negative. 2. Fever -Tmax 101.4 overnight. CXR on 09/10/19 w/slightly improved ground glass opacities thought to represent multifocal PNA - Rocephin started. BC w/ NGTD 2.ESRD- on HD TTS. K 4.5.TDC removed1/26. HD  today per regular schedule.if time needs to be shortened - it shouldn't be <3.5 3. Anemiaof CKD-Hgb 9.7. ContinueAranesp to 136mcg qwk. Transfuse prn.tsat 34%. 4. Secondary hyperparathyroidism-Ca/P ok. No binders or VDRA. No parsabiv because not on formulary.  5.HTN/volume-Does not appear volume overloaded.  Blood pressure well controlled.Well below EDW, will need to lower at d/c 6. Nutrition- Albumin low2.1. Dys 3 diet Continue renal vit/prostat. And ensure change to NePro With some K^ with ensure  7.LV thrombus-Heparin bridging tocoumadin. 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 Per primary 9.Disposition: SNF placement when bed available- having placement difficulties.    Jen Mow, PA-C [facility] Pager: [phone] 09/14/2019,9:56 AM  LOS: 46 days

## 2019-09-15 ENCOUNTER — Inpatient Hospital Stay (HOSPITAL_COMMUNITY): Payer: Medicare Other

## 2019-09-15 LAB — CBC

## 2019-09-15 LAB — CBC WITH DIFFERENTIAL/PLATELET
Abs Immature Granulocytes: 0.07 10*3/uL (ref 0.00–0.07)
Basophils Absolute: 0.1 10*3/uL (ref 0.0–0.1)
Basophils Relative: 1 %
Eosinophils Absolute: 0.2 10*3/uL (ref 0.0–0.5)
Eosinophils Relative: 2 %
HCT: 34.5 % — ABNORMAL LOW (ref 39.0–52.0)
Hemoglobin: 10.8 g/dL — ABNORMAL LOW (ref 13.0–17.0)
Immature Granulocytes: 1 %
Lymphocytes Relative: 21 %
Lymphs Abs: 1.7 10*3/uL (ref 0.7–4.0)
MCH: 31.9 pg (ref 26.0–34.0)
MCHC: 31.3 g/dL (ref 30.0–36.0)
MCV: 101.8 fL — ABNORMAL HIGH (ref 80.0–100.0)
Monocytes Absolute: 0.7 10*3/uL (ref 0.1–1.0)
Monocytes Relative: 9 %
Neutro Abs: 5.4 10*3/uL (ref 1.7–7.7)
Neutrophils Relative %: 66 %
Platelets: 251 10*3/uL (ref 150–400)
RBC: 3.39 MIL/uL — ABNORMAL LOW (ref 4.22–5.81)
RDW: 17.8 % — ABNORMAL HIGH (ref 11.5–15.5)
WBC: 8 10*3/uL (ref 4.0–10.5)
nRBC: 0 % (ref 0.0–0.2)

## 2019-09-15 LAB — LACTIC ACID, PLASMA
Lactic Acid, Venous: 2 mmol/L (ref 0.5–1.9)
Lactic Acid, Venous: 1.5 mmol/L (ref 0.5–1.9)

## 2019-09-15 LAB — GLUCOSE, CAPILLARY
Glucose-Capillary: 94 mg/dL (ref 70–99)
Glucose-Capillary: 159 mg/dL — ABNORMAL HIGH (ref 70–99)
Glucose-Capillary: 247 mg/dL — ABNORMAL HIGH (ref 70–99)
Glucose-Capillary: 215 mg/dL — ABNORMAL HIGH (ref 70–99)

## 2019-09-15 LAB — PROTIME-INR
INR: 1.8 — ABNORMAL HIGH (ref 0.8–1.2)
Prothrombin Time: 20.8 seconds — ABNORMAL HIGH (ref 11.4–15.2)

## 2019-09-15 LAB — AST: AST: 20 U/L (ref 15–41)

## 2019-09-15 LAB — ALKALINE PHOSPHATASE: Alkaline Phosphatase: 65 U/L (ref 38–126)

## 2019-09-15 LAB — RENAL FUNCTION PANEL
Albumin: 2.2 g/dL — ABNORMAL LOW (ref 3.5–5.0)
Anion gap: 14 (ref 5–15)
BUN: 21 mg/dL (ref 8–23)
CO2: 26 mmol/L (ref 22–32)
Calcium: 8.3 mg/dL — ABNORMAL LOW (ref 8.9–10.3)
Chloride: 96 mmol/L — ABNORMAL LOW (ref 98–111)
Creatinine, Ser: 5.12 mg/dL — ABNORMAL HIGH (ref 0.61–1.24)
GFR calc Af Amer: 13 mL/min — ABNORMAL LOW (ref >60)
GFR calc non Af Amer: 11 mL/min — ABNORMAL LOW (ref >60)
Glucose, Bld: 99 mg/dL (ref 70–99)
Phosphorus: 4.7 mg/dL — ABNORMAL HIGH (ref 2.5–4.6)
Potassium: 4.3 mmol/L (ref 3.5–5.1)
Sodium: 136 mmol/L (ref 135–145)

## 2019-09-15 LAB — PROCALCITONIN: Procalcitonin: 1.49 ng/mL

## 2019-09-15 LAB — PROTEIN, TOTAL: Total Protein: 6.9 g/dL (ref 6.5–8.1)

## 2019-09-15 LAB — BILIRUBIN, TOTAL: Total Bilirubin: 0.4 mg/dL (ref 0.3–1.2)

## 2019-09-15 LAB — ALT: ALT: 26 U/L (ref 0–44)

## 2019-09-15 LAB — MAGNESIUM: Magnesium: 1.9 mg/dL (ref 1.7–2.4)

## 2019-09-15 MED ORDER — WARFARIN SODIUM 7.5 MG PO TABS
7.5000 mg | ORAL_TABLET | Freq: Once | ORAL | Status: AC
  Administered 2019-09-15: 7.5 mg via ORAL
  Filled 2019-09-15: qty 1

## 2019-09-15 NOTE — Progress Notes (Signed)
Repeat vitals were obtained and are: BP 100/65, P 91, O2 99 percent on RA, and temp. Is 99.4. RN will continue to monitor.   Eleanora Neighbor, RN

## 2019-09-15 NOTE — Significant Event (Signed)
Rapid Response Event Note  Overview:Called d/t RED MEWS-4 for BP-98/61, HR-114, and T-100.5. Fever being treated with tylenol. VS repeated and MEWS now yellow-2 for T-100.5 and BP-100/65. Please call RRT if assistance needed.      Dillard Essex

## 2019-09-15 NOTE — Progress Notes (Signed)
Pt initial vitals were: BP 98/61, P 114, temp. 102.8, O2 93 percent RA. RN gave Tylenol and rechecked temp. Which was 100.5. BP and pulse was then rechecked and is 88/56, P 102. RN messaged NP on call to make aware. Awaiting response. Will continue to monitor.    Eleanora Neighbor, RN

## 2019-09-15 NOTE — Progress Notes (Signed)
PROGRESS NOTE    Jonathan Tawes Sr.  [id number] DOB: [birth year] DOA: 07/30/2019 PCP: Marton Redwood, MD    Brief Narrative:  Jonathan Bartkiewicz Sr. 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 MRI brain showed small infarct in the left pons left occiput. Neurology saw the patient and felt this was secondary to left ventricular thrombus; he was started on heparin. He was diagnosed with Covid 07/24/2019. Patient has recurrently tested positive but is asymptomatic and this is probably positivity from his index infection in December.  New events last 24 hours / Subjective: Overnight patient was febrile.  Patient did not have any acute complaint no chest pain abdominal pain no nausea no vomiting no diarrhea.  No burning urination does not make any urine.   Assessment & Plan:   Principal Problem:   Acute respiratory failure due to COVID-19 [facility]) Active Problems:   Chronic hepatitis C without hepatic coma ([name])   Diabetes mellitus type 2, uncomplicated ([place])   History of coronary artery disease   Hypertension   ESRD (end stage renal disease) on dialysis ([place])   Acute respiratory failure with hypoxia ([name])   Cerebral thrombosis with cerebral infarction   Cerebral embolism with cerebral infarction  COVID-19 pneumonia -Initially tested positive in December 2020 and completed appropriate treatment.  No longer infectious and off of precautions at this point.  Assumed that patient could continue to test positive for 90 days from his initial positive test  Acute strokes left pons left occipital -Etiology unclear but though to be secondary to Covid versus LV thrombus -Continue ASA, lipitor, coumadin  -Appreciate neurology/stroke team -SNF placement recommended  ESRD/HD TTSa    -Nephrology following  LV thrombus -Coumadin per pharmacy  Prior cadaveric renal transplant -Continue prednisone  Hyperglycemia, Pre-diabetes -Continue NovoLog  HTN -Continue toprol   UTI, not POA, not CAUTI  -Blood culture 1/30 after fever, negative to date -Urine culture NGTD -Rocephin x 5 days  completed  Fever on 09/15/2019. Work-up initiated with so far not significant. Continue to monitor off of antibiotics.  Follow-up on cultures.   DVT prophylaxis: Coumadin Code Status: Full Family Communication: None at bedside  Disposition Plan: Patient presented from home and currently PT recommendation is for SNF.  Discharge barrier is due to finding SNF placement as patient continues to test positive for Covid infection which was treated in December.   Consultants:   Neurology  Nephrology   Antimicrobials:  Anti-infectives (From admission, onward)   Start     Dose/Rate Route Frequency Ordered Stop   09/11/19 0830  cefTRIAXone (ROCEPHIN) 1 g in sodium chloride 0.9 % 100 mL IVPB     1 g 200 mL/hr over 30 Minutes Intravenous Daily 09/11/19 0726 09/15/19 0959   07/31/19 1000  remdesivir 100 mg in sodium chloride 0.9 % 100 mL IVPB     100 mg 200 mL/hr over 30 Minutes Intravenous Daily 07/30/19 2217 08/04/19 0748   07/31/19 0800  ceFEPIme (MAXIPIME) 1 g in sodium chloride 0.9 % 100 mL IVPB  Status:  Discontinued     1 g 200 mL/hr over 30 Minutes Intravenous Every 24 hours 07/31/19 0748 07/31/19 0816   07/31/19 0800  vancomycin (VANCOREADY) IVPB 1500 mg/300 mL  Status:  Discontinued     1,500 mg 150 mL/hr over 120  Minutes Intravenous  Once 07/31/19 0748 07/31/19 0816   07/31/19 0748  vancomycin variable dose per unstable renal function (pharmacist dosing)  Status:  Discontinued      Does not apply See admin instructions 07/31/19 0748 07/31/19 0816   07/30/19 2230  remdesivir 200 mg in sodium chloride 0.9% 250 mL IVPB     200 mg 580 mL/hr over 30 Minutes  Intravenous Once 07/30/19 2217 07/31/19 0045        Objective: Vitals:   09/15/19 0111 09/15/19 0500 09/15/19 0920 09/15/19 1500  BP: 100/65 (!) 111/56 124/62 115/69  Pulse: 91 95 84 96  Resp: 16 16 18 17   Temp:  98 F (36.7 C) 98.3 F (36.8 C) 98 F (36.7 C)  TempSrc: Oral Oral Oral Oral  SpO2: 99% 100% 98% 100%  Weight:  63.1 kg    Height:        Intake/Output Summary (Last 24 hours) at 09/15/2019 1819 Last data filed at 09/15/2019 1055 Gross per 24 hour  Intake 280 ml  Output 150 ml  Net 130 ml   Filed Weights   09/14/19 1749 09/14/19 2230 09/15/19 0500  Weight: 65.5 kg 63.1 kg 63.1 kg    Examination:  General exam: Appears calm and comfortable  Respiratory system: Occasional rhonchi, no wheezing. Respiratory effort normal. Cardiovascular system: S1 & S2 heard, RRR. No murmur. No pedal edema. Gastrointestinal system: Abdomen is nondistended, soft and nontender. Normal bowel sounds heard. Central nervous system: Oriented x2, has generalized weakness Extremities: Symmetric 5 x 5 power. Skin: No rashes Psychiatry: Mood & affect appropriate.     Data Reviewed: I have personally reviewed following labs and imaging studies  CBC: Recent Labs  Lab 09/09/19 0218 09/11/19 0553 09/12/19 0805 09/14/19 1415 09/15/19 0713  WBC 10.1 8.4 A999333 6.5 DUPLICATE REQUEST  8.0  NEUTROABS  --   --  7.7  --  5.4  HGB 10.8* 9.5* 9.7* 9.2* DUPLICATE REQUEST  A999333*  HCT 34.9* 29.9* 123456* 0000000* DUPLICATE REQUEST  Q000111Q*  MCV 105.1* 101.7* 0000000* 0000000* DUPLICATE REQUEST  Q000111Q*  PLT 123456 0000000 [id number] 123456 DUPLICATE REQUEST  [id number]   Basic Metabolic Panel: Recent Labs  Lab 09/12/19 0353 09/12/19 0805 09/13/19 0408 09/14/19 0432 09/15/19 0713 09/15/19 0741  NA 135 135 136 135 136  --   K 5.3* 5.2* 4.3 4.5 4.3  --   CL 97* 99 99 99 96*  --   CO2 24 23 25 24 26   --   GLUCOSE 90 80 119* 149* 99  --   BUN 48* 50* 26* 40* 21  --   CREATININE 8.53* 8.74* 5.62* 7.52* 5.12*  --     CALCIUM 8.8* 8.7* 8.3* 8.2* 8.3*  --   MG  --   --   --   --   --  1.9  PHOS 4.4 4.3 5.3* 4.6 4.7*  --    GFR: Estimated Creatinine Clearance: 12.7 mL/min (A) (by C-G formula based on SCr of 5.12 mg/dL (H)). Liver Function Tests: Recent Labs  Lab 09/12/19 0353 09/12/19 0805 09/13/19 0408 09/14/19 0432 09/15/19 0713 09/15/19 0741  AST  --   --   --   --   --  20  ALT  --   --   --   --   --  26  ALKPHOS  --   --   --   --   --  65  BILITOT  --   --   --   --   --  0.4  PROT  --   --   --   --   --  6.9  ALBUMIN 2.4* 2.3* 2.2* 2.1* 2.2*  --    No results for input(s): LIPASE, AMYLASE in the last 168 hours. No results for input(s): AMMONIA in the last 168 hours. Coagulation Profile: Recent Labs  Lab 09/11/19 0553 09/12/19 0353 09/13/19 0408 09/14/19 0432 09/15/19 0713  INR 3.2* 3.2* 2.3* 2.1* 1.8*   Cardiac Enzymes: No results for input(s): CKTOTAL, CKMB, CKMBINDEX, TROPONINI in the last 168 hours. BNP (last 3 results) No results for input(s): PROBNP in the last 8760 hours. HbA1C: No results for input(s): HGBA1C in the last 72 hours. CBG: Recent Labs  Lab 09/14/19 1834 09/14/19 2127 09/15/19 0637 09/15/19 1110 09/15/19 1620  GLUCAP 136* 136* 94 159* 247*   Lipid Profile: No results for input(s): CHOL, HDL, LDLCALC, TRIG, CHOLHDL, LDLDIRECT in the last 72 hours. Thyroid Function Tests: No results for input(s): TSH, T4TOTAL, FREET4, T3FREE, THYROIDAB in the last 72 hours. Anemia Panel: No results for input(s): VITAMINB12, FOLATE, FERRITIN, TIBC, IRON, RETICCTPCT in the last 72 hours. Sepsis Labs: Recent Labs  Lab 09/10/19 0846 09/11/19 0553 09/15/19 0741 09/15/19 1016 09/15/19 1326  PROCALCITON 1.16 1.74 1.49  --   --   LATICACIDVEN  --   --   --  2.0* 1.5    Recent Results (from the past 240 hour(s))  Culture, blood (routine x 2)     Status: None   Collection Time: 09/07/19  5:55 PM   Specimen: BLOOD LEFT HAND  Result Value Ref Range Status    Specimen Description BLOOD LEFT HAND  Final   Special Requests   Final    BOTTLES DRAWN AEROBIC AND ANAEROBIC Blood Culture adequate volume   Culture   Final    NO GROWTH 5 DAYS Performed at [facility], [place] [address] [place], [place] [address]    Report Status 09/12/2019 FINAL  Final  Culture, blood (routine x 2)     Status: None   Collection Time: 09/07/19  6:04 PM   Specimen: BLOOD LEFT HAND  Result Value Ref Range Status   Specimen Description BLOOD LEFT HAND  Final   Special Requests   Final    BOTTLES DRAWN AEROBIC ONLY Blood Culture adequate volume   Culture   Final    NO GROWTH 5 DAYS Performed at [facility], [place] [address] [place], [place] [address]    Report Status 09/12/2019 FINAL  Final         Radiology Studies: DG CHEST PORT 1 VIEW  Result Date: 09/15/2019 CLINICAL DATA:  69 year old male admitted for respiratory distress. Positive COVID-19 in December. Trouble swallowing, dialysis patient. EXAM: PORTABLE CHEST 1 VIEW COMPARISON:  Chest radiographs 09/10/2019 and earlier. FINDINGS: Portable AP upright view at 0810 hours. Stable lung volumes with moderate elevation of the left hemidiaphragm. Coarse bilateral pulmonary interstitial opacity with peripheral and slight basilar confluence is stable. Normal cardiac size and mediastinal contours. Visualized tracheal air column is within normal limits. No pneumothorax or pleural effusion. Negative visible bowel gas pattern. No acute osseous abnormality identified. IMPRESSION: 1. Stable interstitial and patchy bilateral pulmonary opacity compatible with sequelae of COVID-19 pneumonia. 2. 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 Electronically Signed   By: Genevie Ann M.D.   On: 09/15/2019 08:33        Scheduled Meds: .  stroke: mapping our early stages of recovery  book   Does not apply Once  . allopurinol  100 mg Oral Daily  . aspirin  81 mg Oral Daily  .  atorvastatin  40 mg Oral Daily  . Chlorhexidine Gluconate Cloth  6 each Topical Q0600  . darbepoetin (ARANESP) injection - DIALYSIS  150 mcg Intravenous Q Tue-HD  . feeding supplement (NEPRO CARB STEADY)  237 mL Oral BID BM  . feeding supplement (PRO-STAT SUGAR FREE 64)  30 mL Oral BID  . insulin aspart  0-15 Units Subcutaneous TID WC  . insulin aspart  0-5 Units Subcutaneous QHS  . insulin aspart  3 Units Subcutaneous TID WC  . metoCLOPramide  5 mg Oral BID WC  . multivitamin  1 tablet Oral QHS  . pantoprazole  40 mg Oral Daily  . predniSONE  5 mg Oral Q breakfast  . sodium chloride flush  3 mL Intravenous Q12H  . Warfarin - Pharmacist Dosing Inpatient   Does not apply q1800   Continuous Infusions: . sodium chloride 10 mL/hr at 09/14/19 1008  . sodium chloride    . sodium chloride       LOS: 47 days    Time spent: 25 min    Berle Mull, MD [facility]   To contact the attending provider between 7A-7P or the covering provider during after hours 7P-7A, please log into the web site [email or web address] and access using universal [place] password for that web site. If you do not have the password, please call the hospital operator.  09/15/2019, 6:19 PM

## 2019-09-15 NOTE — Progress Notes (Signed)
[facility] Progress Note   Subjective:   Seen and examined at bedside.  No new complaints. Fever spike to 102.28F overnight with hypotension. WBC normal.  Rechecking blood cultures. Denies CP, SOB, n/v/d, abdominal pain, weakness and fatigue.   Objective Vitals:   09/14/19 2349 09/15/19 0111 09/15/19 0500 09/15/19 0920  BP: (!) 88/56 100/65 (!) 111/56 124/62  Pulse: (!) 102 91 95 84  Resp:  16 16 18   Temp:   98 F (36.7 C) 98.3 F (36.8 C)  TempSrc:  Oral Oral Oral  SpO2: 95% 99% 100% 98%  Weight:   63.1 kg   Height:       Physical Exam General:Chronically ill appearing male in NAD Heart:RRR Lungs:CTAB, nml WOB Abdomen:soft, NTND Extremities:no LE edema Dialysis Access: RU AVG   Filed Weights   09/14/19 1749 09/14/19 2230 09/15/19 0500  Weight: 65.5 kg 63.1 kg 63.1 kg    Intake/Output Summary (Last 24 hours) at 09/15/2019 1426 Last data filed at 09/15/2019 1055 Gross per 24 hour  Intake 280 ml  Output 2050 ml  Net -1770 ml    Additional Objective Labs: Basic Metabolic Panel: Recent Labs  Lab 09/13/19 0408 09/14/19 0432 09/15/19 0713  NA 136 135 136  K 4.3 4.5 4.3  CL 99 99 96*  CO2 25 24 26   GLUCOSE 119* 149* 99  BUN 26* 40* 21  CREATININE 5.62* 7.52* 5.12*  CALCIUM 8.3* 8.2* 8.3*  PHOS 5.3* 4.6 4.7*   Liver Function Tests: Recent Labs  Lab 09/13/19 0408 09/14/19 0432 09/15/19 0713 09/15/19 0741  AST  --   --   --  20  ALT  --   --   --  26  ALKPHOS  --   --   --  65  BILITOT  --   --   --  0.4  PROT  --   --   --  6.9  ALBUMIN 2.2* 2.1* 2.2*  --    CBC: Recent Labs  Lab 09/09/19 0218 09/09/19 0218 09/11/19 0553 09/11/19 0553 09/12/19 0805 09/14/19 1415 09/15/19 0713  WBC 10.1   < > 8.4   < > A999333 6.5 DUPLICATE REQUEST  8.0  NEUTROABS  --   --   --   --  7.7  --  5.4  HGB 10.8*   < > 9.5*   < > 9.7* 9.2* DUPLICATE REQUEST  A999333*  HCT 34.9*   < > 29.9*   < > 123456* 0000000* DUPLICATE REQUEST  Q000111Q*  MCV 105.1*  --   101.7*  --  0000000* 0000000* DUPLICATE REQUEST  Q000111Q*  PLT 305   < > 246   < > 99991111 123456 DUPLICATE REQUEST  [id number]   < > = values in this interval not displayed.   CBG: Recent Labs  Lab 09/14/19 1120 09/14/19 1834 09/14/19 2127 09/15/19 0637 09/15/19 1110  GLUCAP 168* 136* 136* 94 159*   Studies/Results: DG CHEST PORT 1 VIEW  Result Date: 09/15/2019 CLINICAL DATA:  69 year old male admitted for respiratory distress. Positive COVID-19 in December. Trouble swallowing, dialysis patient. EXAM: PORTABLE CHEST 1 VIEW COMPARISON:  Chest radiographs 09/10/2019 and earlier. FINDINGS: Portable AP upright view at 0810 hours. Stable lung volumes with moderate elevation of the left hemidiaphragm. Coarse bilateral pulmonary interstitial opacity with peripheral and slight basilar confluence is stable. Normal cardiac size and mediastinal contours. Visualized tracheal air column is within normal limits. No pneumothorax or pleural effusion. Negative visible bowel gas pattern. No acute  osseous abnormality identified. IMPRESSION: 1. Stable interstitial and patchy bilateral pulmonary opacity compatible with sequelae of COVID-19 pneumonia. 2. 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 Electronically Signed   By: Genevie Ann M.D.   On: 09/15/2019 08:33    Medications: . sodium chloride 10 mL/hr at 09/14/19 1008  . sodium chloride    . sodium chloride     .  stroke: mapping our early stages of recovery book   Does not apply Once  . allopurinol  100 mg Oral Daily  . aspirin  81 mg Oral Daily  . atorvastatin  40 mg Oral Daily  . Chlorhexidine Gluconate Cloth  6 each Topical Q0600  . darbepoetin (ARANESP) injection - DIALYSIS  150 mcg Intravenous Q Tue-HD  . feeding supplement (NEPRO CARB STEADY)  237 mL Oral BID BM  . feeding supplement (PRO-STAT SUGAR FREE 64)  30 mL Oral BID  . insulin aspart  0-15 Units Subcutaneous TID WC  . insulin aspart  0-5 Units  Subcutaneous QHS  . insulin aspart  3 Units Subcutaneous TID WC  . metoCLOPramide  5 mg Oral BID WC  . multivitamin  1 tablet Oral QHS  . pantoprazole  40 mg Oral Daily  . predniSONE  5 mg Oral Q breakfast  . sodium chloride flush  3 mL Intravenous Q12H  . warfarin  7.5 mg Oral ONCE-1800  . Warfarin - Pharmacist Dosing Inpatient   Does not apply q1800    Dialysis Orders: TTSG/O -[facility] when COVID 19 negative 4 hr 15 min 180NRE 400/800 80.5 kg 2.0 K/ 2.0 Ca R AVF/RIJ TDC -Heparin 8000 units IV TIW -Mircera 75 mcg IV q 2 weeks (last dose 07/25/19) -Venofer 50 mg IV weekly -Parsabiv 7.5 mg IV TIW  Assessment/Plan: 1.Acute COVID-19 pneumonia - s/p remdesivir &Decadron. Per primary. Now out of 21 day isolation window but still required to have 2 negative COVID 19 test before he can return to [facility]- last + 1/14. Not retested since. Pt can dialyze at [facility] until COVID tests negative. 2. Fever -Tmax 102.8 overnight.WBC 8. CXR on 09/10/19 w/slightly improved ground glass opacities thought to represent multifocal PNA - Rocephin started. BC w/ NGTD. Repeat BC ordered today 2.ESRD- on HD TTS. K 4.5.TDC removed1/26.Next HD 2/9. if time needs to be shortened - it shouldn't be <3.5 3. Anemiaof CKD-Hgb 10.8.ContinueAranesp to 15mcg qwk. Transfuse prn.tsat 34%. 4. Secondary hyperparathyroidism-Ca/P ok. No binders or VDRA. No parsabiv because not on formulary.  5.HTN/volume-Does not appear volume overloaded. Blood pressure well controlled.Well below EDW, will need to lower at d/c 6. Nutrition- Albumin low2.2. Dys 3 diet Continue renal vit/prostat. And ensure change to NePro With some K^ with ensure  7.LV thrombus-Heparin bridging tocoumadin. 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 Per primary 9.Disposition: SNF placement when bed available- having placement difficulties.  Jen Mow, PA-C [facility] Pager: [phone] 09/15/2019,2:26 PM  LOS: 47 days

## 2019-09-15 NOTE — Plan of Care (Signed)
  Problem: Coping: Goal: Psychosocial and spiritual needs will be supported Outcome: Progressing   Problem: Self-Care: Goal: Verbalization of feelings and concerns over difficulty with self-care will improve Outcome: Progressing   Problem: Nutrition: Goal: Risk of aspiration will decrease Outcome: Progressing   Problem: Education: Goal: Knowledge of risk factors and measures for prevention of condition will improve Outcome: Not Progressing   Problem: Education: Goal: Knowledge of secondary prevention will improve Outcome: Not Progressing   Problem: Self-Care: Goal: Ability to participate in self-care as condition permits will improve Outcome: Not Progressing

## 2019-09-15 NOTE — Progress Notes (Signed)
ANTICOAGULATION CONSULT NOTE - Follow Up Consult  Pharmacy Consult for Coumadin Indication: LV thrombus, CVA  No Known Allergies  Patient Measurements: Height: 5\' 11"  (180.3 cm) Weight: 139 lb 1.8 oz (63.1 kg) IBW/kg (Calculated) : 75.3  Vital Signs: Temp: 98.3 F (36.8 C) (02/07 0920) Temp Source: Oral (02/07 0920) BP: 124/62 (02/07 0920) Pulse Rate: 84 (02/07 0920)  Labs: Recent Labs    09/13/19 0408 09/14/19 0432 09/14/19 1415 09/15/19 0713  HGB  --   --  9.2* 10.8*  HCT  --   --  30.1* 34.5*  PLT  --   --  266 251  LABPROT 25.5* 23.2*  --  20.8*  INR 2.3* 2.1*  --  1.8*  CREATININE 5.62* 7.52*  --  5.12*    Estimated Creatinine Clearance: 12.7 mL/min (A) (by C-G formula based on SCr of 5.12 mg/dL (H)).   Assessment: Apix per MD> heparin gtt for h/o LV thrombus > warfarin  (also recent stroke noted)  INR dropped 3.2> 2.3> 2.1>1.8 due to holding warfarin x2 days. Patient required 6 - 7.5mg  to get to goal so will likely maintain at ~ 5mg . However, given holding/low dose x3 days and rapid downtrend, will require more to prevent further downtrend. On dysphagia diet with variable intake.   Goal of Therapy:  INR 2-3 Monitor platelets by anticoagulation protocol: Yes   Plan:  Warfarin 7.5 mg x1 tonight Once INR stops down trending, anticipate 4-5mg  daily will keep patient at goal  Daily INR  Berenice Bouton, PharmD PGY1 Pharmacy Resident  Please check AMION for all [facility] phone numbers After 10:00 PM, call [place] [phone]  09/15/2019 10:16 AM

## 2019-09-16 LAB — GLUCOSE, CAPILLARY
Glucose-Capillary: 86 mg/dL (ref 70–99)
Glucose-Capillary: 229 mg/dL — ABNORMAL HIGH (ref 70–99)
Glucose-Capillary: 292 mg/dL — ABNORMAL HIGH (ref 70–99)
Glucose-Capillary: 141 mg/dL — ABNORMAL HIGH (ref 70–99)

## 2019-09-16 LAB — SARS CORONAVIRUS 2 (TAT 6-24 HRS): SARS Coronavirus 2: NEGATIVE

## 2019-09-16 MED ORDER — WARFARIN SODIUM 4 MG PO TABS
4.0000 mg | ORAL_TABLET | Freq: Once | ORAL | Status: AC
  Administered 2019-09-16: 4 mg via ORAL
  Filled 2019-09-16: qty 1

## 2019-09-16 NOTE — Progress Notes (Signed)
Lab just called this RN and stated that three different phlebotomists tried to obtain blood and could not get any. Pt is not refusing, they were just unable to obtain any blood.   Eleanora Neighbor, RN

## 2019-09-16 NOTE — Progress Notes (Signed)
Physical Therapy Treatment Patient Details Name: Jonathan Scelfo Sr. MRN: [id number] DOB: [birth year] Today's Date: 09/16/2019    History of Present Illness 69 y.o. 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 Diagnosed with COVID 6 days prior to coming to ED, where chest x-ray showed multifocal pneumonia. Admitted 07/30/19 for treatment of acute encephalopathy, difficulty swallowing and pneumonia.MRI of the brain showed small acute infarcts in the left pons, left occipital lob and right frontal lobe in addition to chronic infarcts     PT Comments    Pt was assisted to exercise LE's when he declined OOB today.  Has chair ready if he decides to get up with nursing but is not feeling that energetic today.  Follow acutely for strength and mobility of joints to ease his effort needed to stand with RW.  Pt is verbalizing his interest in getting home when able .  Follow Up Recommendations  SNF     Equipment Recommendations  None recommended by PT    Recommendations for Other Services       Precautions / Restrictions Precautions Precautions: Fall Precaution Comments: quiet today Restrictions Weight Bearing Restrictions: No    Mobility  Bed Mobility Overal bed mobility: Needs Assistance             General bed mobility comments: declined  Transfers Overall transfer level: Needs assistance               General transfer comment: declined  Ambulation/Gait                 Stairs             Wheelchair Mobility    Modified Rankin (Stroke Patients Only)       Balance                                            Cognition Arousal/Alertness: Lethargic Behavior During Therapy: Flat affect Overall Cognitive Status: Impaired/Different from baseline Area of  Impairment: Problem solving;Awareness;Safety/judgement;Following commands                   Current Attention Level: Selective Memory: Decreased short-term memory Following Commands: Follows one step commands inconsistently;Follows one step commands with increased time Safety/Judgement: Decreased awareness of safety Awareness: Intellectual Problem Solving: Slow processing;Requires verbal cues        Exercises General Exercises - Lower Extremity Ankle Circles/Pumps: AROM;AAROM;Both;5 reps Quad Sets: AROM;10 reps Heel Slides: AAROM;10 reps Hip ABduction/ADduction: AAROM;10 reps Straight Leg Raises: AAROM;10 reps Hip Flexion/Marching: AAROM;10 reps    General Comments        Pertinent Vitals/Pain Pain Assessment: No/denies pain    Home Living                      Prior Function            PT Goals (current goals can now be found in the care plan section) Acute Rehab PT Goals Patient Stated Goal: get stronger Progress towards PT goals: Progressing toward goals    Frequency    Min 2X/week      PT Plan Current plan remains appropriate    Co-evaluation  AM-PAC PT "6 Clicks" Mobility   Outcome Measure  Help needed turning from your back to your side while in a flat bed without using bedrails?: A Lot Help needed moving from lying on your back to sitting on the side of a flat bed without using bedrails?: A Lot Help needed moving to and from a bed to a chair (including a wheelchair)?: A Lot Help needed standing up from a chair using your arms (e.g., wheelchair or bedside chair)?: A Lot Help needed to walk in hospital room?: A Lot Help needed climbing 3-5 steps with a railing? : Total 6 Click Score: 11    End of Session   Activity Tolerance: Patient limited by fatigue Patient left: with nursing/sitter in room;in bed;with bed alarm set Nurse Communication: Mobility status PT Visit Diagnosis: Unsteadiness on feet (R26.81);Other  abnormalities of gait and mobility (R26.89);Muscle weakness (generalized) (M62.81);Ataxic gait (R26.0);Difficulty in walking, not elsewhere classified (R26.2)     Time: [id number] PT Time Calculation (min) (ACUTE ONLY): 18 min  Charges:  $Therapeutic Exercise: 8-22 mins                Ramond Dial 09/16/2019, 4:32 PM   Mee Hives, PT MS Acute Rehab Dept. Number: [place] and [place]

## 2019-09-16 NOTE — Evaluation (Signed)
Clinical/Bedside Swallow Evaluation Patient Details  Name: Jonathan Bettin Sr. MRN: [id number] Date of Birth: 13-Mar-1951  Today's Date: 09/16/2019 Time: SLP Start Time (ACUTE ONLY): [id number] SLP Stop Time (ACUTE ONLY): 1624 SLP Time Calculation (min) (ACUTE ONLY): 14 min  Past Medical History:  Past Medical History:  Diagnosis Date  . Diabetes mellitus without complication ([place])   . Hypertension   . Renal disorder 2015   right kidney transplant  . Stroke [facility])    Past Surgical History:  Past Surgical History:  Procedure Laterality Date  . BACK SURGERY     Has had 2 back surgeries  . Depression    . HAND SURGERY Right   . HAND SURGERY    . IR REMOVAL TUN CV CATH W/O FL  09/03/2019  . KIDNEY TRANSPLANT     November 2015  . Memory loss    . NEPHRECTOMY TRANSPLANTED ORGAN     HPI:  69 y.o. male with history of ESRD on hemodialysis, LV thrombus, renal transplant, stroke, hypertension diabetes mellitus admitted 07/30/19 with new onset swallowing difficulty. 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  He was diagnosed with COVID-19 07/24/19. He was seen by SLP services and ultimately had MBS 1/13, which revealed no aspiration or penetration, mild pyriform residue, he was advanced to mechanical soft diet, thin liquids.  Pt now with fever of unknown origin.  New orders received for swallow assessment; RN reports limited appetite.     Assessment / Plan / Recommendation Clinical Impression  Pt participated in swallow reassessment.  Oral mechanism exam unremarkable; missing dentition.  No obvious asymmetry or persisting focal CN deficits. Pt demonstrated adequate oral anticipation/manipulation of bolus materials.  Swallow response appeared to be brisk.  There were no overt s/s of aspiration with successive boluses of thin liquid from a straw.  No coughing, no  throat-clearing, and no dyssynchrony with breathing/swallowing mechanics.  Oropharyngeal swallowing appears to be functional - doubt prandial aspiration as source of fever.  D/W RN. No further SLP f/u recommended; continue current diet.   SLP Visit Diagnosis: Dysphagia, unspecified (R13.10)           Diet Recommendation   continue regular solids, thin liquids to allow more food options  Medication Administration: Whole meds with puree    Other  Recommendations Oral Care Recommendations: Oral care BID   Follow up Recommendations        Frequency and Duration            Prognosis        Swallow Study   General HPI: 69 y.o. male with history of ESRD on hemodialysis, LV thrombus, renal transplant, stroke, hypertension diabetes mellitus admitted 07/30/19 with new onset swallowing difficulty. 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  He was diagnosed with COVID-19 07/24/19. He was seen by SLP services and ultimately had MBS 1/13, which revealed no aspiration or penetration, mild pyriform residue, he was advanced to mechanical soft diet, thin liquids.  Pt now with fever of unknown origin.  New orders received for swallow assessment; RN reports limited appetite.   Type of Study: Bedside Swallow Evaluation Diet Prior to this Study: Regular;Thin liquids Temperature Spikes Noted: Yes Respiratory Status: Room air History of Recent Intubation: No Behavior/Cognition: Alert;Requires cueing;Confused Oral  Cavity Assessment: Within Functional Limits Oral Care Completed by SLP: No Oral Cavity - Dentition: Missing dentition Vision: Functional for self-feeding Self-Feeding Abilities: Able to feed self;Needs set up Patient Positioning: Upright in bed Baseline Vocal Quality: Normal Volitional Cough: Strong Volitional Swallow: Able to elicit    Oral/Motor/Sensory  Function Overall Oral Motor/Sensory Function: Within functional limits   Ice Chips Ice chips: Not tested   Thin Liquid Thin Liquid: Within functional limits Presentation: Cup;Straw    Nectar Thick Nectar Thick Liquid: Not tested   Honey Thick Honey Thick Liquid: Not tested   Puree Puree: Within functional limits   Solid     Solid: Within functional limits      [name] 09/16/2019,4:27 PM   Estill Bamberg L. Tivis Ringer, [place] Office number [phone]

## 2019-09-16 NOTE — Plan of Care (Signed)
  Problem: Activity: Goal: Risk for activity intolerance will decrease Outcome: Progressing   

## 2019-09-16 NOTE — Plan of Care (Signed)
  Problem: Coping: Goal: Psychosocial and spiritual needs will be supported Outcome: Progressing   Problem: Self-Care: Goal: Ability to participate in self-care as condition permits will improve Outcome: Progressing

## 2019-09-16 NOTE — Progress Notes (Signed)
Subjective:  Eating lunch  No cos / for hd tomor on schedule   Objective Vital signs in last 24 hours: Vitals:   09/16/19 0500 09/16/19 0529 09/16/19 0609 09/16/19 0840  BP:  (!) 142/77  117/68  Pulse:  99  (!) 102  Resp:  18  18  Temp:  98.5 F (36.9 C)  97.9 F (36.6 C)  TempSrc:  Oral  Oral  SpO2:  96% 96% 95%  Weight: 63.1 kg     Height:       Weight change: -5.892 kg  Physical Exam General:Alert ,Chronically ill appearing male in NAD Heart:RRR, no m,r,g  Lungs:CTAB, nml WOB Abdomen:soft, NT ND Extremities:no LE edema Dialysis Access: RU AVF pos bruit   Dialysis Orders: TTSG/O -[facility] when COVID 19 negative 4 hr 15 min 180NRE 400/800 80.5 kg 2.0 K/ 2.0 Ca R AVF -Heparin 8000 units IV TIW -Mircera 75 mcg IV q 2 weeks (last dose 07/25/19) -Venofer 50 mg IV weekly -Parsabiv 7.5 mg IV TIW   Problem/Plan    1.Acute COVID-19 pneumonia - s/p remdesivir &Decadron. Per primary. Now out of 21 day isolation window but still required to have 2 negative COVID 19 test before he can return to [facility]- last + 1/14. Not retested since. Pt can dialyze at [facility] until COVID tests negative. 2. Fever -Tmax 102.8 past weekend .WBC 8. 2/07  CXR on 2/2/21w/slightly improved ground glass opacities thought to represent multifocal PNA - Rocephin x 5 days completed . BC w/ NGTD. Repeat BC ordered 2/07  2.ESRD- on HD TTS. K 4.3 02/07 .TDC removed1/26.Next HD 2/9. if time needs to be shortened - it shouldn't be <3.5 3. Anemiaof CKD-Hgb 10.8.ContinueAranesp to 195mcg qwk. Transfuse prn.tsat 34%. 4. Secondary hyperparathyroidism-Ca/P ok. No binders or VDRA. No parsabiv because not on formulary.  5.HTN/volume-Does not appear volume overloaded. Blood pressure well controlled.Well below EDW, will need to lower at d/c 6. Nutrition- Albumin low2.2. Dys 3 diet Continue renal vit/prostat. And ensure change to NePro With some K^ with ensure  7.LV  thrombus-Heparin bridging tocoumadin. 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 Per primary 9.Disposition: SNF placement when bed available- having placement difficulties.  Ernest Haber, PA-C [facility] [phone] 09/16/2019,2:01 PM  LOS: 48 days   Labs: Basic Metabolic Panel: Recent Labs  Lab 09/13/19 0408 09/14/19 0432 09/15/19 0713  NA 136 135 136  K 4.3 4.5 4.3  CL 99 99 96*  CO2 25 24 26   GLUCOSE 119* 149* 99  BUN 26* 40* 21  CREATININE 5.62* 7.52* 5.12*  CALCIUM 8.3* 8.2* 8.3*  PHOS 5.3* 4.6 4.7*   Liver Function Tests: Recent Labs  Lab 09/13/19 0408 09/14/19 0432 09/15/19 0713 09/15/19 0741  AST  --   --   --  20  ALT  --   --   --  26  ALKPHOS  --   --   --  65  BILITOT  --   --   --  0.4  PROT  --   --   --  6.9  ALBUMIN 2.2* 2.1* 2.2*  --    No results for input(s): LIPASE, AMYLASE in the last 168 hours. No results for input(s): AMMONIA in the last 168 hours. CBC: Recent Labs  Lab 09/11/19 0553 09/11/19 0553 09/12/19 0805 09/14/19 1415 09/15/19 0713  WBC 8.4   < > A999333 6.5 DUPLICATE REQUEST  8.0  NEUTROABS  --   --  7.7  --  5.4  HGB 9.5*   < > 9.7* 9.2* DUPLICATE REQUEST  A999333*  HCT 29.9*   < > 123456* 0000000* DUPLICATE REQUEST  [id number]*  MCV 101.7*  --  0000000* 0000000* DUPLICATE REQUEST  [id number]*  PLT 246   < > 99991111 123456 DUPLICATE REQUEST  [id number]   < > = values in this interval not displayed.   Cardiac Enzymes: No results for input(s): CKTOTAL, CKMB, CKMBINDEX, TROPONINI in the last 168 hours. CBG: Recent Labs  Lab 09/15/19 1110 09/15/19 1620 09/15/19 2134 09/16/19 0716 09/16/19 1132  GLUCAP 159* 247* 215* 86 229*    Studies/Results: DG CHEST PORT 1 VIEW  Result Date: 09/15/2019 CLINICAL DATA:  69 year old male admitted for respiratory distress. Positive COVID-19 in December. Trouble swallowing, dialysis patient. EXAM: PORTABLE CHEST 1 VIEW COMPARISON:  Chest radiographs  09/10/2019 and earlier. FINDINGS: Portable AP upright view at 0810 hours. Stable lung volumes with moderate elevation of the left hemidiaphragm. Coarse bilateral pulmonary interstitial opacity with peripheral and slight basilar confluence is stable. Normal cardiac size and mediastinal contours. Visualized tracheal air column is within normal limits. No pneumothorax or pleural effusion. Negative visible bowel gas pattern. No acute osseous abnormality identified. IMPRESSION: 1. Stable interstitial and patchy bilateral pulmonary opacity compatible with sequelae of COVID-19 pneumonia. 2. 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 Electronically Signed   By: Genevie Ann M.D.   On: 09/15/2019 08:33   Medications: . sodium chloride 10 mL/hr at 09/14/19 1008  . sodium chloride    . sodium chloride     .  stroke: mapping our early stages of recovery book   Does not apply Once  . allopurinol  100 mg Oral Daily  . aspirin  81 mg Oral Daily  . atorvastatin  40 mg Oral Daily  . Chlorhexidine Gluconate Cloth  6 each Topical Q0600  . darbepoetin (ARANESP) injection - DIALYSIS  150 mcg Intravenous Q Tue-HD  . feeding supplement (NEPRO CARB STEADY)  237 mL Oral BID BM  . feeding supplement (PRO-STAT SUGAR FREE 64)  30 mL Oral BID  . insulin aspart  0-15 Units Subcutaneous TID WC  . insulin aspart  0-5 Units Subcutaneous QHS  . insulin aspart  3 Units Subcutaneous TID WC  . metoCLOPramide  5 mg Oral BID WC  . multivitamin  1 tablet Oral QHS  . pantoprazole  40 mg Oral Daily  . predniSONE  5 mg Oral Q breakfast  . sodium chloride flush  3 mL Intravenous Q12H  . warfarin  4 mg Oral ONCE-1800  . Warfarin - Pharmacist Dosing Inpatient   Does not apply [phone]

## 2019-09-16 NOTE — Progress Notes (Signed)
ANTICOAGULATION CONSULT NOTE - Follow Up Consult  Pharmacy Consult for Coumadin Indication: LV thrombus, CVA  No Known Allergies  Patient Measurements: Height: 5\' 11"  (180.3 cm) Weight: 139 lb 1.8 oz (63.1 kg) IBW/kg (Calculated) : 75.3  Vital Signs: Temp: 97.9 F (36.6 C) (02/08 0840) Temp Source: Oral (02/08 0840) BP: 117/68 (02/08 0840) Pulse Rate: 102 (02/08 0840)  Labs: Recent Labs    09/14/19 0432 09/14/19 1415 09/15/19 0713  HGB  --  9.2* DUPLICATE REQUEST  A999333*  HCT  --  0000000* DUPLICATE REQUEST  [id number]*  PLT  --  123456 DUPLICATE REQUEST  [id number]  LABPROT 23.2*  --  20.8*  INR 2.1*  --  1.8*  CREATININE 7.52*  --  5.12*    Estimated Creatinine Clearance: 12.7 mL/min (A) (by C-G formula based on SCr of 5.12 mg/dL (H)).   Assessment: 66 yoM previously on apixaban for LV thrombus changed to warfarin this admission. Pt noted to have recent stroke on MRI 12/23.   INR has been labile, with warfarin held x2 doses on 2/2-2/3 for high INR, then with low INR of 1.8 yesterday. Phlebotomy unable to draw labs x3 this morning, MD authorized one more attempt which was also unsuccessful so will defer labs this morning. As noted above, INR subtherapeutic yesterday but pt received 7.5mg  boosted dose - with limited information available will give a conservative dose tonight.  Goal of Therapy:  INR 2-3 Monitor platelets by anticoagulation protocol: Yes   Plan:  -Warfarin 4mg  PO x1 tonight -Recheck INR tomorrow am   Arrie Senate, PharmD, BCPS Clinical Pharmacist [phone] Please check AMION for all [place] numbers 09/16/2019

## 2019-09-16 NOTE — Progress Notes (Addendum)
PROGRESS NOTE    Jonathan Goodbar Sr.  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 MRI brain showed small infarct in the left pons left occiput. Neurology saw the patient and felt this was secondary to left ventricular thrombus ( 2D echo did not show any clots or PFO); he was started on heparin. He was diagnosed with Covid infection in 07/24/2019. Patient has recurrently tested positive -probably positivity from his index infection in December.Hospital course complicated by fever spikes on 1/30, 2/1,2/3 , 2/5 and 2/6 (102.58F)  Subjective:  Patient resting comfortably and eating pudding. Denies any acute c/o or pain or dysphagia  Objective: Vitals:   09/15/19 2134 09/16/19 0500 09/16/19 0529 09/16/19 0609  BP: 110/82  (!) 142/77   Pulse: 93  99   Resp: 17  18   Temp: 98.2 F (36.8 C)  98.5 F (36.9 C)   TempSrc:   Oral   SpO2: 95%  96% 96%  Weight: 63.1 kg 63.1 kg    Height:        Intake/Output Summary (Last 24 hours) at 09/16/2019 0855 Last data filed at 09/16/2019 0845 Gross per 24 hour  Intake 200 ml  Output 875 ml  Net -675 ml   Filed Weights   09/15/19 0500 09/15/19 2134 09/16/19 0500  Weight: 63.1 kg 63.1 kg 63.1 kg    Physical Examination:  General exam: Appears calm and comfortable  Respiratory system: Clear to auscultation. Respiratory effort normal. Cardiovascular system: S1 & S2 heard, RRR. No JVD, murmurs, rubs, gallops or clicks. No pedal edema. Gastrointestinal system: Abdomen is nondistended, soft and nontender. Normal bowel sounds heard. Central nervous system: Alert and oriented. No new focal neurological deficits. Extremities: No contractures, edema or joint  deformities.  Skin: No rashes, lesions or ulcers Psychiatry: Judgement and insight appear normal. Mood & affect appropriate.   Data Reviewed: I have personally reviewed following labs and imaging studies  CBC: Recent Labs  Lab 09/11/19 0553 09/12/19 0805 09/14/19 1415 09/15/19 0713  WBC 8.4 A999333 6.5 DUPLICATE REQUEST  8.0  NEUTROABS  --  7.7  --  5.4  HGB 9.5* 9.7* 9.2* DUPLICATE REQUEST  A999333*  HCT 29.9* 123456* 0000000* DUPLICATE REQUEST  [id number]*  MCV 101.7* 0000000* 0000000* DUPLICATE REQUEST  [id number]*  PLT 0000000 [id number] DUPLICATE REQUEST  [id number]   Basic Metabolic Panel: Recent Labs  Lab 09/12/19 0353 09/12/19 0805 09/13/19 0408 09/14/19 0432 09/15/19 0713 09/15/19 0741  NA 135 135 136 135 136  --   K 5.3* 5.2* 4.3 4.5 4.3  --   CL 97* 99 99 99 96*  --   CO2 24 23 25 24 26   --   GLUCOSE 90 80 119* 149* 99  --   BUN 48* 50* 26* 40* 21  --   CREATININE 8.53* 8.74* 5.62* 7.52* 5.12*  --   CALCIUM 8.8* 8.7* 8.3* 8.2* 8.3*  --   MG  --   --   --   --   --  1.9  PHOS 4.4 4.3 5.3* 4.6 4.7*  --    GFR: Estimated Creatinine Clearance: 12.7 mL/min (A) (by C-G formula based on SCr of 5.12 mg/dL (H)). Liver Function Tests: Recent Labs  Lab 09/12/19 [phone]  09/12/19 0805 09/13/19 0408 09/14/19 0432 09/15/19 0713 09/15/19 0741  AST  --   --   --   --   --  20  ALT  --   --   --   --   --  26  ALKPHOS  --   --   --   --   --  65  BILITOT  --   --   --   --   --  0.4  PROT  --   --   --   --   --  6.9  ALBUMIN 2.4* 2.3* 2.2* 2.1* 2.2*  --    No results for input(s): LIPASE, AMYLASE in the last 168 hours. No results for input(s): AMMONIA in the last 168 hours. Coagulation Profile: Recent Labs  Lab 09/11/19 0553 09/12/19 0353 09/13/19 0408 09/14/19 0432 09/15/19 0713  INR 3.2* 3.2* 2.3* 2.1* 1.8*   Cardiac Enzymes: No results for input(s): CKTOTAL, CKMB, CKMBINDEX, TROPONINI in the last 168 hours. BNP (last 3 results) No results for input(s): PROBNP in the last 8760  hours. HbA1C: No results for input(s): HGBA1C in the last 72 hours. CBG: Recent Labs  Lab 09/15/19 0637 09/15/19 1110 09/15/19 1620 09/15/19 2134 09/16/19 0716  GLUCAP 94 159* 247* 215* 86   Lipid Profile: No results for input(s): CHOL, HDL, LDLCALC, TRIG, CHOLHDL, LDLDIRECT in the last 72 hours. Thyroid Function Tests: No results for input(s): TSH, T4TOTAL, FREET4, T3FREE, THYROIDAB in the last 72 hours. Anemia Panel: No results for input(s): VITAMINB12, FOLATE, FERRITIN, TIBC, IRON, RETICCTPCT in the last 72 hours. Sepsis Labs: Recent Labs  Lab 09/10/19 0846 09/11/19 0553 09/15/19 0741 09/15/19 1016 09/15/19 1326  PROCALCITON 1.16 1.74 1.49  --   --   LATICACIDVEN  --   --   --  2.0* 1.5    Recent Results (from the past 240 hour(s))  Culture, blood (routine x 2)     Status: None   Collection Time: 09/07/19  5:55 PM   Specimen: BLOOD LEFT HAND  Result Value Ref Range Status   Specimen Description BLOOD LEFT HAND  Final   Special Requests   Final    BOTTLES DRAWN AEROBIC AND ANAEROBIC Blood Culture adequate volume   Culture   Final    NO GROWTH 5 DAYS Performed at [facility], [place] [address] [place], [place] [address]    Report Status 09/12/2019 FINAL  Final  Culture, blood (routine x 2)     Status: None   Collection Time: 09/07/19  6:04 PM   Specimen: BLOOD LEFT HAND  Result Value Ref Range Status   Specimen Description BLOOD LEFT HAND  Final   Special Requests   Final    BOTTLES DRAWN AEROBIC ONLY Blood Culture adequate volume   Culture   Final    NO GROWTH 5 DAYS Performed at [facility], [place] [address], [place], [place] [address]    Report Status 09/12/2019 FINAL  Final      Radiology Studies: DG CHEST PORT 1 VIEW  Result Date: 09/15/2019 CLINICAL DATA:  69 year old male admitted for respiratory distress. Positive COVID-19 in December. Trouble swallowing, dialysis patient. EXAM: PORTABLE CHEST 1 VIEW COMPARISON:  Chest radiographs  09/10/2019 and earlier. FINDINGS: Portable AP upright view at 0810 hours. Stable lung volumes with moderate elevation of the left hemidiaphragm. Coarse bilateral pulmonary interstitial opacity with peripheral and slight basilar confluence is stable. Normal cardiac size and mediastinal contours. Visualized tracheal air column  is within normal limits. No pneumothorax or pleural effusion. Negative visible bowel gas pattern. No acute osseous abnormality identified. IMPRESSION: 1. Stable interstitial and patchy bilateral pulmonary opacity compatible with sequelae of COVID-19 pneumonia. 2. 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 Electronically Signed   By: Genevie Ann M.D.   On: 09/15/2019 08:33        Scheduled Meds: .  stroke: mapping our early stages of recovery book   Does not apply Once  . allopurinol  100 mg Oral Daily  . aspirin  81 mg Oral Daily  . atorvastatin  40 mg Oral Daily  . Chlorhexidine Gluconate Cloth  6 each Topical Q0600  . darbepoetin (ARANESP) injection - DIALYSIS  150 mcg Intravenous Q Tue-HD  . feeding supplement (NEPRO CARB STEADY)  237 mL Oral BID BM  . feeding supplement (PRO-STAT SUGAR FREE 64)  30 mL Oral BID  . insulin aspart  0-15 Units Subcutaneous TID WC  . insulin aspart  0-5 Units Subcutaneous QHS  . insulin aspart  3 Units Subcutaneous TID WC  . metoCLOPramide  5 mg Oral BID WC  . multivitamin  1 tablet Oral QHS  . pantoprazole  40 mg Oral Daily  . predniSONE  5 mg Oral Q breakfast  . sodium chloride flush  3 mL Intravenous Q12H  . Warfarin - Pharmacist Dosing Inpatient   Does not apply q1800   Continuous Infusions: . sodium chloride 10 mL/hr at 09/14/19 1008  . sodium chloride    . 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 -Neurology saw the patient and felt  this was secondary to left ventricular thrombus; he was started on heparin.Continue ASA, lipitor, Eliquis changed to coumadin -SNF placement recommended by PT  Fevers: Secondary to aspiration vs acute infections.Urine culture NGTD. However, patient received Rocephin x 5 daysfor presumed UTI/aspiration during HC. Blood culture 1/30 after fever, negative to date.Repeat Cx sent yesterday.  CXR on 2/7-Stable interstitial and patchy bilateral pulmonary opacity compatible with sequelae of COVID-19 pneumonia.Persistent elevated left hemidiaphragm likely reflects eventration or chronic phrenic nerve palsy. HD catheter removed 1/26 and site looks okay with no redness but has mild draiange. Given transplant h/o and concern for recurrent fevers, consulted ID. Consult speech to r/o ongoing aspiration. CT chest /abd/pelvis ordered per d/w Dr Tommy Medal   Recent COVID-19 pneumonia-Initially tested positive in December 2020 and completed appropriate treatment. No longer infectious and off of precautions at this point. Assumed that patient could continue to test positive for 90 days from his initial positive test  ESRD/HD TTSa  -Nephrology following  ?LV thrombus- Not seen on echo, not sure if TEE deferred due to [place]. Coumadin per pharmacy  Prior cadaveric renal transplant-Continue prednisone  Hyperglycemia, Pre-diabetes-Continue NovoLog  HTN-Continue Toprol    DVT prophylaxis:Coumadin Code Status:Full Family Communication:None at bedside . D/W patient Disposition Plan:Patient presented from home and currently PT recommendation is for SNF. Discharge barrier is due to finding SNF placement as patient continues spike fevers and test positive for Covid infection which was treated in December.     LOS: 48 days    Time spent:     Guilford Shi, MD [facility] Pager in [place]  If 7PM-7AM, please contact night-coverage [email or web address] Password [facility] 09/16/2019, 8:55 AM

## 2019-09-16 NOTE — TOC Progression Note (Signed)
Transition of Care (TOC) - Progression Note    Patient Details  Name: Jonathan Ponzio Sr. MRN: [id number] Date of Birth: 10/04/1950  Transition of Care [facility]) CM/SW [place], [place] Phone Number: 09/16/2019, 4:08 PM  Clinical Narrative:    Pt outside isolation window for COVID. Aware if pt tests negative can return to [facility] for normal dialysis schedule, if not CSW spoke with dialysis navigator [place] who may be able to adjust schedule.   CSW to refax out pt.    Expected Discharge Plan: [place]    Expected Discharge Plan and Services Expected Discharge Plan: [place] arrangements for the past 2 months: Single Family Home   Readmission Risk Interventions No flowsheet data found.

## 2019-09-17 ENCOUNTER — Encounter (HOSPITAL_COMMUNITY): Payer: Self-pay | Admitting: Internal Medicine

## 2019-09-17 ENCOUNTER — Inpatient Hospital Stay (HOSPITAL_COMMUNITY): Payer: Medicare Other

## 2019-09-17 DIAGNOSIS — R509 Fever, unspecified: Secondary | ICD-10-CM

## 2019-09-17 DIAGNOSIS — I120 Hypertensive chronic kidney disease with stage 5 chronic kidney disease or end stage renal disease: Secondary | ICD-10-CM

## 2019-09-17 DIAGNOSIS — Z87891 Personal history of nicotine dependence: Secondary | ICD-10-CM

## 2019-09-17 DIAGNOSIS — Z7901 Long term (current) use of anticoagulants: Secondary | ICD-10-CM

## 2019-09-17 DIAGNOSIS — J189 Pneumonia, unspecified organism: Secondary | ICD-10-CM

## 2019-09-17 DIAGNOSIS — Z8616 Personal history of COVID-19: Secondary | ICD-10-CM

## 2019-09-17 DIAGNOSIS — E1122 Type 2 diabetes mellitus with diabetic chronic kidney disease: Secondary | ICD-10-CM

## 2019-09-17 DIAGNOSIS — Z8673 Personal history of transient ischemic attack (TIA), and cerebral infarction without residual deficits: Secondary | ICD-10-CM

## 2019-09-17 DIAGNOSIS — Z86718 Personal history of other venous thrombosis and embolism: Secondary | ICD-10-CM

## 2019-09-17 DIAGNOSIS — B351 Tinea unguium: Secondary | ICD-10-CM

## 2019-09-17 HISTORY — DX: Fever, unspecified: R50.9

## 2019-09-17 LAB — RENAL FUNCTION PANEL
Albumin: 2.2 g/dL — ABNORMAL LOW (ref 3.5–5.0)
Anion gap: 14 (ref 5–15)
BUN: 55 mg/dL — ABNORMAL HIGH (ref 8–23)
CO2: 25 mmol/L (ref 22–32)
Calcium: 8.4 mg/dL — ABNORMAL LOW (ref 8.9–10.3)
Chloride: 92 mmol/L — ABNORMAL LOW (ref 98–111)
Creatinine, Ser: 7.87 mg/dL — ABNORMAL HIGH (ref 0.61–1.24)
GFR calc Af Amer: 7 mL/min — ABNORMAL LOW (ref >60)
GFR calc non Af Amer: 6 mL/min — ABNORMAL LOW (ref >60)
Glucose, Bld: 223 mg/dL — ABNORMAL HIGH (ref 70–99)
Phosphorus: 3.3 mg/dL (ref 2.5–4.6)
Potassium: 5 mmol/L (ref 3.5–5.1)
Sodium: 131 mmol/L — ABNORMAL LOW (ref 135–145)

## 2019-09-17 LAB — CBC
HCT: 31.1 % — ABNORMAL LOW (ref 39.0–52.0)
HCT: 30.9 % — ABNORMAL LOW (ref 39.0–52.0)
Hemoglobin: 9.8 g/dL — ABNORMAL LOW (ref 13.0–17.0)
Hemoglobin: 9.5 g/dL — ABNORMAL LOW (ref 13.0–17.0)
MCH: 32 pg (ref 26.0–34.0)
MCH: 31.6 pg (ref 26.0–34.0)
MCHC: 31.5 g/dL (ref 30.0–36.0)
MCHC: 30.7 g/dL (ref 30.0–36.0)
MCV: 101.6 fL — ABNORMAL HIGH (ref 80.0–100.0)
MCV: 102.7 fL — ABNORMAL HIGH (ref 80.0–100.0)
Platelets: 277 10*3/uL (ref 150–400)
Platelets: 276 10*3/uL (ref 150–400)
RBC: 3.06 MIL/uL — ABNORMAL LOW (ref 4.22–5.81)
RBC: 3.01 MIL/uL — ABNORMAL LOW (ref 4.22–5.81)
RDW: 17.6 % — ABNORMAL HIGH (ref 11.5–15.5)
RDW: 17.6 % — ABNORMAL HIGH (ref 11.5–15.5)
WBC: 7.3 10*3/uL (ref 4.0–10.5)
WBC: 6.7 10*3/uL (ref 4.0–10.5)
nRBC: 0 % (ref 0.0–0.2)
nRBC: 0 % (ref 0.0–0.2)

## 2019-09-17 LAB — GLUCOSE, CAPILLARY
Glucose-Capillary: 246 mg/dL — ABNORMAL HIGH (ref 70–99)
Glucose-Capillary: 354 mg/dL — ABNORMAL HIGH (ref 70–99)
Glucose-Capillary: 149 mg/dL — ABNORMAL HIGH (ref 70–99)
Glucose-Capillary: 56 mg/dL — ABNORMAL LOW (ref 70–99)

## 2019-09-17 LAB — HEPATITIS B SURFACE ANTIGEN: Hepatitis B Surface Ag: NONREACTIVE

## 2019-09-17 LAB — PROTIME-INR
INR: 2.3 — ABNORMAL HIGH (ref 0.8–1.2)
Prothrombin Time: 25 seconds — ABNORMAL HIGH (ref 11.4–15.2)

## 2019-09-17 MED ORDER — HEPARIN SODIUM (PORCINE) 1000 UNIT/ML IJ SOLN
INTRAMUSCULAR | Status: AC
  Administered 2019-09-17: 5000 [IU] via INTRAVENOUS_CENTRAL
  Filled 2019-09-17: qty 5

## 2019-09-17 MED ORDER — DARBEPOETIN ALFA 150 MCG/0.3ML IJ SOSY
PREFILLED_SYRINGE | INTRAMUSCULAR | Status: AC
  Administered 2019-09-17: 150 ug via INTRAVENOUS
  Filled 2019-09-17: qty 0.3

## 2019-09-17 MED ORDER — WARFARIN SODIUM 4 MG PO TABS
4.0000 mg | ORAL_TABLET | Freq: Once | ORAL | Status: AC
  Administered 2019-09-17: 4 mg via ORAL
  Filled 2019-09-17: qty 1

## 2019-09-17 NOTE — Progress Notes (Signed)
DR Radparvar radiologist called and wanted this RN to notified Sharlet Salina NP to read patient"s CT of ABD/Pelvis/Chest done tonight.Sharlet Salina NP was texted about above information will continue  To monitor

## 2019-09-17 NOTE — H&P (View-Only) (Signed)
PROGRESS NOTE    Jonathan Viernes Sr.  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 MRI brain showed small infarct in the left pons left occiput. Neurology saw the patient and felt this was secondary to left ventricular thrombus ( 2D echo did not show any clots or PFO); he was started on heparin. He was diagnosed with Covid infection in 07/24/2019. Patient has recurrently tested positive -probably positivity from his index infection in December.Hospital course complicated by fever spikes on 1/30, 2/1,2/3 , 2/5 and 2/6 (102.19F)  Subjective:  Patient resting comfortably. Eating well. Denies any acute c/o or pain or dysphagia  Objective: Vitals:   09/17/19 1231 09/17/19 1300 09/17/19 1330 09/17/19 1400  BP: 127/77 114/69 111/68 98/61  Pulse: 96 93 91 (!) 101  Resp:      Temp:      TempSrc:      SpO2:      Weight:      Height:        Intake/Output Summary (Last 24 hours) at 09/17/2019 1546 Last data filed at 09/17/2019 0854 Gross per 24 hour  Intake 740 ml  Output 450 ml  Net 290 ml   Filed Weights   09/15/19 2134 09/16/19 0500 09/17/19 1215  Weight: 63.1 kg 63.1 kg 68.3 kg    Physical Examination:  General exam: Appears calm and comfortable  Respiratory system: Clear to auscultation. Respiratory effort normal. Cardiovascular system: S1 & S2 heard, RRR. No JVD, murmurs, rubs, gallops or clicks. No pedal edema. Gastrointestinal system: Abdomen is nondistended, soft and nontender. Normal bowel sounds heard. Central nervous system: Alert and oriented. No new focal neurological deficits. Extremities: No contractures, edema or joint deformities.  Skin: No rashes, lesions or  ulcers Psychiatry: Judgement and insight appear normal. Mood & affect appropriate.   Data Reviewed: I have personally reviewed following labs and imaging studies  CBC: Recent Labs  Lab 09/12/19 0805 09/14/19 1415 09/15/19 0713 09/17/19 0530 09/17/19 1239  WBC 36.6 6.5 DUPLICATE REQUEST  8.0 7.3 6.7  NEUTROABS 7.7  --  5.4  --   --   HGB 9.7* 9.2* DUPLICATE REQUEST  44.0* 9.8* 9.5*  HCT 34.7* 42.5* DUPLICATE REQUEST  95.6* 31.1* 30.9*  MCV 387.5* 643.3* DUPLICATE REQUEST  295.1* 101.6* 102.7*  PLT 884 166 DUPLICATE REQUEST  063 016 010   Basic Metabolic Panel: Recent Labs  Lab 09/12/19 0805 09/13/19 0408 09/14/19 0432 09/15/19 0713 09/15/19 0741 09/17/19 0530  NA 135 136 135 136  --  131*  K 5.2* 4.3 4.5 4.3  --  5.0  CL 99 99 99 96*  --  92*  CO2 23 25 24 26   --  25  GLUCOSE 80 119* 149* 99  --  223*  BUN 50* 26* 40* 21  --  55*  CREATININE 8.74* 5.62* 7.52* 5.12*  --  7.87*  CALCIUM 8.7* 8.3* 8.2* 8.3*  --  8.4*  MG  --   --   --   --  1.9  --   PHOS 4.3 5.3* 4.6 4.7*  --  3.3   GFR: Estimated Creatinine Clearance: 8.9 mL/min (A) (by C-G formula based on SCr of 7.87 mg/dL (H)). Liver Function Tests: Recent Labs  Lab 09/12/19  8588 09/13/19 0408 09/14/19 0432 09/15/19 0713 09/15/19 0741 09/17/19 0530  AST  --   --   --   --  20  --   ALT  --   --   --   --  26  --   ALKPHOS  --   --   --   --  65  --   BILITOT  --   --   --   --  0.4  --   PROT  --   --   --   --  6.9  --   ALBUMIN 2.3* 2.2* 2.1* 2.2*  --  2.2*   No results for input(s): LIPASE, AMYLASE in the last 168 hours. No results for input(s): AMMONIA in the last 168 hours. Coagulation Profile: Recent Labs  Lab 09/12/19 0353 09/13/19 0408 09/14/19 0432 09/15/19 0713 09/17/19 0530  INR 3.2* 2.3* 2.1* 1.8* 2.3*   Cardiac Enzymes: No results for input(s): CKTOTAL, CKMB, CKMBINDEX, TROPONINI in the last 168 hours. BNP (last 3 results) No results for input(s): PROBNP in the last 8760  hours. HbA1C: No results for input(s): HGBA1C in the last 72 hours. CBG: Recent Labs  Lab 09/16/19 1132 09/16/19 1703 09/16/19 2204 09/17/19 0717 09/17/19 1137  GLUCAP 229* 292* 141* 246* 354*   Lipid Profile: No results for input(s): CHOL, HDL, LDLCALC, TRIG, CHOLHDL, LDLDIRECT in the last 72 hours. Thyroid Function Tests: No results for input(s): TSH, T4TOTAL, FREET4, T3FREE, THYROIDAB in the last 72 hours. Anemia Panel: No results for input(s): VITAMINB12, FOLATE, FERRITIN, TIBC, IRON, RETICCTPCT in the last 72 hours. Sepsis Labs: Recent Labs  Lab 09/11/19 0553 09/15/19 0741 09/15/19 1016 09/15/19 1326  PROCALCITON 1.74 1.49  --   --   LATICACIDVEN  --   --  2.0* 1.5    Recent Results (from the past 240 hour(s))  Culture, blood (routine x 2)     Status: None   Collection Time: 09/07/19  5:55 PM   Specimen: BLOOD LEFT HAND  Result Value Ref Range Status   Specimen Description BLOOD LEFT HAND  Final   Special Requests   Final    BOTTLES DRAWN AEROBIC AND ANAEROBIC Blood Culture adequate volume   Culture   Final    NO GROWTH 5 DAYS Performed at [facility], [place] [address] [place], [place] [address]    Report Status 09/12/2019 FINAL  Final  Culture, blood (routine x 2)     Status: None   Collection Time: 09/07/19  6:04 PM   Specimen: BLOOD LEFT HAND  Result Value Ref Range Status   Specimen Description BLOOD LEFT HAND  Final   Special Requests   Final    BOTTLES DRAWN AEROBIC ONLY Blood Culture adequate volume   Culture   Final    NO GROWTH 5 DAYS Performed at [facility], [place] [address] [place], [place] [address]    Report Status 09/12/2019 FINAL  Final  Culture, blood (single)     Status: None (Preliminary result)   Collection Time: 09/15/19 10:16 AM   Specimen: BLOOD LEFT HAND  Result Value Ref Range Status   Specimen Description BLOOD LEFT HAND  Final   Special Requests   Final    AEROBIC BOTTLE ONLY Blood Culture results may not be  optimal due to an inadequate volume of blood received in culture bottles   Culture   Final    NO GROWTH 2 DAYS Performed at [facility], [address] [place], [place] [address]    Report Status PENDING  Incomplete  SARS CORONAVIRUS 2 (TAT 6-24 HRS) Nasopharyngeal Nasopharyngeal Swab     Status: None   Collection Time: 09/16/19  1:55 PM   Specimen: Nasopharyngeal Swab  Result Value Ref Range Status   SARS Coronavirus 2 NEGATIVE NEGATIVE Final    Comment: (NOTE) SARS-CoV-2 target nucleic acids are NOT DETECTED. The SARS-CoV-2 RNA is generally detectable in upper and lower respiratory specimens during the acute phase of infection. Negative results do not preclude SARS-CoV-2 infection, do not rule out co-infections with other pathogens, and should not be used as the sole basis for treatment or other patient management decisions. Negative results must be combined with clinical observations, patient history, and epidemiological information. The expected result is Negative. Fact Sheet for Patients: SugarRoll.be Fact Sheet for Healthcare Providers: https://www.woods-mathews.com/ This test is not yet approved or cleared by the [place] FDA and  has been authorized for detection and/or diagnosis of SARS-CoV-2 by FDA under an Emergency Use Authorization (EUA). This EUA will remain  in effect (meaning this test can be used) for the duration of the COVID-19 declaration under Section 56 4(b)(1) of the Act, 21 U.S.C. section 360bbb-3(b)(1), unless the authorization is terminated or revoked sooner. Performed at [facility], [place] [address], [place], [place] [address]       Radiology Studies: No results found.      Scheduled Meds: .  stroke: mapping our early stages of recovery book   Does not apply Once  . allopurinol  100 mg Oral Daily  . aspirin  81 mg Oral Daily  . atorvastatin  40 mg Oral Daily  . Chlorhexidine  Gluconate Cloth  6 each Topical Q0600  . darbepoetin (ARANESP) injection - DIALYSIS  150 mcg Intravenous Q Tue-HD  . feeding supplement (NEPRO CARB STEADY)  237 mL Oral BID BM  . feeding supplement (PRO-STAT SUGAR FREE 64)  30 mL Oral BID  . insulin aspart  0-15 Units Subcutaneous TID WC  . insulin aspart  0-5 Units Subcutaneous QHS  . insulin aspart  3 Units Subcutaneous TID WC  . metoCLOPramide  5 mg Oral BID WC  . multivitamin  1 tablet Oral QHS  . pantoprazole  40 mg Oral Daily  . predniSONE  5 mg Oral Q breakfast  . sodium chloride flush  3 mL Intravenous Q12H  . warfarin  4 mg Oral ONCE-1800  . Warfarin - Pharmacist Dosing Inpatient   Does not apply q1800   Continuous Infusions: . sodium chloride 10 mL/hr at 09/14/19 1008  . sodium chloride    . 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 -Neurology saw the patient and felt this was secondary to left ventricular thrombus; he was started on heparin.Continue ASA, lipitor, Eliquis changed to coumadin -SNF placement recommended by PT  Fevers: Secondary to aspiration vs acute infections.Urine culture NGTD. However, patient received Rocephin x 5 daysfor presumed UTI/aspiration during HC. Blood cultures 1/30, 2/7 after fevers, negative to date.CXR on 2/7-Stable interstitial and patchy bilateral pulmonary opacity compatible with sequelae of COVID-19 pneumonia.Persistent elevated left hemidiaphragm likely reflects eventration or chronic phrenic nerve palsy. HD catheter removed 1/26 and site looks okay with no redness but has mild draiange. Given transplant h/o and concern for recurrent fevers, consulted ID. Consult speech to r/o ongoing  aspiration. CT chest /abd/pelvis ordered (changed to WO contrast as radiology woundnt give contrast if patient been on dialysis for <6month) per d/w Dr VTommy Medal   Recent COVID-19 pneumonia-Initially tested positive in December 2020 and completed appropriate treatment. No longer infectious and off of precautions at this point. Assumed that patient could continue to test positive for 90 days from his initial positive test.   ?LV thrombus- Not seen on echo, not sure if TEE deferred due to [place] Coumadin per pharmacy ID may recommend TEE if CT imaging unremarkable and patient continues to spike fevers. Check ESR/CRP  ESRD/HD TTSa -Nephrology following. [facility]  Prior cadaveric renal transplant-Continue prednisone. Fever w/u as above  Hyperglycemia, Pre-diabetes-Continue NovoLog  HTN-Continue Toprol    DVT prophylaxis:Coumadin Code Status:Full Family Communication:None at bedside . D/W patient Disposition Plan:Patient presented from home and currently PT recommendation is for SNF. Discharge barrier is due to finding SNF placement as patient continues spike fevers and test positive for Covid infection which was treated in December.     LOS: 49 days    Time spent: 35 minutes    NGuilford Shi MD [facility] [place]ager [place] If 7PM-7AM, please contact night-coverage [email or web address] Password [facility]2/04/2020, 3:46 PM

## 2019-09-17 NOTE — Progress Notes (Signed)
PROGRESS NOTE    Jonathan Radloff Sr.  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 MRI brain showed small infarct in the left pons left occiput. Neurology saw the patient and felt this was secondary to left ventricular thrombus ( 2D echo did not show any clots or PFO); he was started on heparin. He was diagnosed with Covid infection in 07/24/2019. Patient has recurrently tested positive -probably positivity from his index infection in December.Hospital course complicated by fever spikes on 1/30, 2/1,2/3 , 2/5 and 2/6 (102.71F)  Subjective:  Patient resting comfortably. Eating well. Denies any acute c/o or pain or dysphagia  Objective: Vitals:   09/17/19 1231 09/17/19 1300 09/17/19 1330 09/17/19 1400  BP: 127/77 114/69 111/68 98/61  Pulse: 96 93 91 (!) 101  Resp:      Temp:      TempSrc:      SpO2:      Weight:      Height:        Intake/Output Summary (Last 24 hours) at 09/17/2019 1546 Last data filed at 09/17/2019 0854 Gross per 24 hour  Intake 740 ml  Output 450 ml  Net 290 ml   Filed Weights   09/15/19 2134 09/16/19 0500 09/17/19 1215  Weight: 63.1 kg 63.1 kg 68.3 kg    Physical Examination:  General exam: Appears calm and comfortable  Respiratory system: Clear to auscultation. Respiratory effort normal. Cardiovascular system: S1 & S2 heard, RRR. No JVD, murmurs, rubs, gallops or clicks. No pedal edema. Gastrointestinal system: Abdomen is nondistended, soft and nontender. Normal bowel sounds heard. Central nervous system: Alert and oriented. No new focal neurological deficits. Extremities: No contractures, edema or joint deformities.  Skin: No rashes, lesions or  ulcers Psychiatry: Judgement and insight appear normal. Mood & affect appropriate.   Data Reviewed: I have personally reviewed following labs and imaging studies  CBC: Recent Labs  Lab 09/12/19 0805 09/14/19 1415 09/15/19 0713 09/17/19 0530 09/17/19 1239  WBC 97.4 6.5 DUPLICATE REQUEST  8.0 7.3 6.7  NEUTROABS 7.7  --  5.4  --   --   HGB 9.7* 9.2* DUPLICATE REQUEST  16.3* 9.8* 9.5*  HCT 84.5* 36.4* DUPLICATE REQUEST  68.0* 31.1* 30.9*  MCV 321.2* 248.2* DUPLICATE REQUEST  500.3* 101.6* 102.7*  PLT 704 888 DUPLICATE REQUEST  916 945 038   Basic Metabolic Panel: Recent Labs  Lab 09/12/19 0805 09/13/19 0408 09/14/19 0432 09/15/19 0713 09/15/19 0741 09/17/19 0530  NA 135 136 135 136  --  131*  K 5.2* 4.3 4.5 4.3  --  5.0  CL 99 99 99 96*  --  92*  CO2 23 25 24 26   --  25  GLUCOSE 80 119* 149* 99  --  223*  BUN 50* 26* 40* 21  --  55*  CREATININE 8.74* 5.62* 7.52* 5.12*  --  7.87*  CALCIUM 8.7* 8.3* 8.2* 8.3*  --  8.4*  MG  --   --   --   --  1.9  --   PHOS 4.3 5.3* 4.6 4.7*  --  3.3   GFR: Estimated Creatinine Clearance: 8.9 mL/min (A) (by C-G formula based on SCr of 7.87 mg/dL (H)). Liver Function Tests: Recent Labs  Lab 09/12/19  8841 09/13/19 0408 09/14/19 0432 09/15/19 0713 09/15/19 0741 09/17/19 0530  AST  --   --   --   --  20  --   ALT  --   --   --   --  26  --   ALKPHOS  --   --   --   --  65  --   BILITOT  --   --   --   --  0.4  --   PROT  --   --   --   --  6.9  --   ALBUMIN 2.3* 2.2* 2.1* 2.2*  --  2.2*   No results for input(s): LIPASE, AMYLASE in the last 168 hours. No results for input(s): AMMONIA in the last 168 hours. Coagulation Profile: Recent Labs  Lab 09/12/19 0353 09/13/19 0408 09/14/19 0432 09/15/19 0713 09/17/19 0530  INR 3.2* 2.3* 2.1* 1.8* 2.3*   Cardiac Enzymes: No results for input(s): CKTOTAL, CKMB, CKMBINDEX, TROPONINI in the last 168 hours. BNP (last 3 results) No results for input(s): PROBNP in the last 8760  hours. HbA1C: No results for input(s): HGBA1C in the last 72 hours. CBG: Recent Labs  Lab 09/16/19 1132 09/16/19 1703 09/16/19 2204 09/17/19 0717 09/17/19 1137  GLUCAP 229* 292* 141* 246* 354*   Lipid Profile: No results for input(s): CHOL, HDL, LDLCALC, TRIG, CHOLHDL, LDLDIRECT in the last 72 hours. Thyroid Function Tests: No results for input(s): TSH, T4TOTAL, FREET4, T3FREE, THYROIDAB in the last 72 hours. Anemia Panel: No results for input(s): VITAMINB12, FOLATE, FERRITIN, TIBC, IRON, RETICCTPCT in the last 72 hours. Sepsis Labs: Recent Labs  Lab 09/11/19 0553 09/15/19 0741 09/15/19 1016 09/15/19 1326  PROCALCITON 1.74 1.49  --   --   LATICACIDVEN  --   --  2.0* 1.5    Recent Results (from the past 240 hour(s))  Culture, blood (routine x 2)     Status: None   Collection Time: 09/07/19  5:55 PM   Specimen: BLOOD LEFT HAND  Result Value Ref Range Status   Specimen Description BLOOD LEFT HAND  Final   Special Requests   Final    BOTTLES DRAWN AEROBIC AND ANAEROBIC Blood Culture adequate volume   Culture   Final    NO GROWTH 5 DAYS Performed at [facility], [place] [address], [place], [place] [address]    Report Status 09/12/2019 FINAL  Final  Culture, blood (routine x 2)     Status: None   Collection Time: 09/07/19  6:04 PM   Specimen: BLOOD LEFT HAND  Result Value Ref Range Status   Specimen Description BLOOD LEFT HAND  Final   Special Requests   Final    BOTTLES DRAWN AEROBIC ONLY Blood Culture adequate volume   Culture   Final    NO GROWTH 5 DAYS Performed at [facility], [place] [address] [place], [place] [address]    Report Status 09/12/2019 FINAL  Final  Culture, blood (single)     Status: None (Preliminary result)   Collection Time: 09/15/19 10:16 AM   Specimen: BLOOD LEFT HAND  Result Value Ref Range Status   Specimen Description BLOOD LEFT HAND  Final   Special Requests   Final    AEROBIC BOTTLE ONLY Blood Culture results may not be  optimal due to an inadequate volume of blood received in culture bottles   Culture   Final    NO GROWTH 2 DAYS Performed at [facility], [address] [place], [place] [address]    Report Status PENDING  Incomplete  SARS CORONAVIRUS 2 (TAT 6-24 HRS) Nasopharyngeal Nasopharyngeal Swab     Status: None   Collection Time: 09/16/19  1:55 PM   Specimen: Nasopharyngeal Swab  Result Value Ref Range Status   SARS Coronavirus 2 NEGATIVE NEGATIVE Final    Comment: (NOTE) SARS-CoV-2 target nucleic acids are NOT DETECTED. The SARS-CoV-2 RNA is generally detectable in upper and lower respiratory specimens during the acute phase of infection. Negative results do not preclude SARS-CoV-2 infection, do not rule out co-infections with other pathogens, and should not be used as the sole basis for treatment or other patient management decisions. Negative results must be combined with clinical observations, patient history, and epidemiological information. The expected result is Negative. Fact Sheet for Patients: SugarRoll.be Fact Sheet for Healthcare Providers: https://www.woods-mathews.com/ This test is not yet approved or cleared by the [place] FDA and  has been authorized for detection and/or diagnosis of SARS-CoV-2 by FDA under an Emergency Use Authorization (EUA). This EUA will remain  in effect (meaning this test can be used) for the duration of the COVID-19 declaration under Section 56 4(b)(1) of the Act, 21 U.S.C. section 360bbb-3(b)(1), unless the authorization is terminated or revoked sooner. Performed at [facility], [place] [address], [place], [place] [address]       Radiology Studies: No results found.      Scheduled Meds: .  stroke: mapping our early stages of recovery book   Does not apply Once  . allopurinol  100 mg Oral Daily  . aspirin  81 mg Oral Daily  . atorvastatin  40 mg Oral Daily  . Chlorhexidine  Gluconate Cloth  6 each Topical Q0600  . darbepoetin (ARANESP) injection - DIALYSIS  150 mcg Intravenous Q Tue-HD  . feeding supplement (NEPRO CARB STEADY)  237 mL Oral BID BM  . feeding supplement (PRO-STAT SUGAR FREE 64)  30 mL Oral BID  . insulin aspart  0-15 Units Subcutaneous TID WC  . insulin aspart  0-5 Units Subcutaneous QHS  . insulin aspart  3 Units Subcutaneous TID WC  . metoCLOPramide  5 mg Oral BID WC  . multivitamin  1 tablet Oral QHS  . pantoprazole  40 mg Oral Daily  . predniSONE  5 mg Oral Q breakfast  . sodium chloride flush  3 mL Intravenous Q12H  . warfarin  4 mg Oral ONCE-1800  . Warfarin - Pharmacist Dosing Inpatient   Does not apply q1800   Continuous Infusions: . sodium chloride 10 mL/hr at 09/14/19 1008  . sodium chloride    . 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 -Neurology saw the patient and felt this was secondary to left ventricular thrombus; he was started on heparin.Continue ASA, lipitor, Eliquis changed to coumadin -SNF placement recommended by PT  Fevers: Secondary to aspiration vs acute infections.Urine culture NGTD. However, patient received Rocephin x 5 daysfor presumed UTI/aspiration during HC. Blood cultures 1/30, 2/7 after fevers, negative to date.CXR on 2/7-Stable interstitial and patchy bilateral pulmonary opacity compatible with sequelae of COVID-19 pneumonia.Persistent elevated left hemidiaphragm likely reflects eventration or chronic phrenic nerve palsy. HD catheter removed 1/26 and site looks okay with no redness but has mild draiange. Given transplant h/o and concern for recurrent fevers, consulted ID. Consult speech to r/o ongoing  aspiration. CT chest /abd/pelvis ordered (changed to WO contrast as radiology woundnt give contrast if patient been on dialysis for <9month) per d/w Dr VTommy Medal   Recent COVID-19 pneumonia-Initially tested positive in December 2020 and completed appropriate treatment. No longer infectious and off of precautions at this point. Assumed that patient could continue to test positive for 90 days from his initial positive test.   ?LV thrombus- Not seen on echo, not sure if TEE deferred due to [place] Coumadin per pharmacy ID may recommend TEE if CT imaging unremarkable and patient continues to spike fevers. Check ESR/CRP  ESRD/HD TTSa -Nephrology following. [facility]  Prior cadaveric renal transplant-Continue prednisone. Fever w/u as above  Hyperglycemia, Pre-diabetes-Continue NovoLog  HTN-Continue Toprol    DVT prophylaxis:Coumadin Code Status:Full Family Communication:None at bedside . D/W patient Disposition Plan:Patient presented from home and currently PT recommendation is for SNF. Discharge barrier is due to finding SNF placement as patient continues spike fevers and test positive for Covid infection which was treated in December.     LOS: 49 days    Time spent: 35 minutes    NGuilford Shi MD [facility] Pager in [place] If 7PM-7AM, please contact night-coverage [email or web address] Password [facility]2/04/2020, 3:46 PM

## 2019-09-17 NOTE — Progress Notes (Signed)
Subjective:  No cos , just finished lunch  For hd  Today .  Objective Vital signs in last 24 hours: Vitals:   09/16/19 0840 09/16/19 2126 09/17/19 0546 09/17/19 0831  BP: 117/68 129/72 131/69 138/76  Pulse: (!) 102 (!) 104 (!) 102 96  Resp: 18 18 18 18   Temp: 97.9 F (36.6 C) 99.3 F (37.4 C) 99.1 F (37.3 C) 99 F (37.2 C)  TempSrc: Oral Oral Oral Oral  SpO2: 95% 100% 92% 98%  Weight:      Height:       Weight change:   Physical Exam General:Alert ,chronically ill appearing male in NAD Heart:RRR, no m,r,g  Lungs:CTAB, nml WOB Abdomen:soft, NT ND Extremities:no LE edema Dialysis Access:RU AVF pos bruit   Dialysis Orders: TTSG/O -[facility] when COVID 19 negative 4 hr 15 min 180NRE 400/800 80.5 kg 2.0 K/ 2.0 Ca R AVF -Heparin 8000 units IV TIW -Mircera 75 mcg IV q 2 weeks (last dose 07/25/19) -Venofer 50 mg IV weekly -Parsabiv 7.5 mg IV TIW   Problem/Plan    1.Acute COVID-19 pneumonia - s/p remdesivir &Decadron. Per primary. Now out of 21 day isolation window but still required to have 2 negative COVID 19 test before [facility]- last + 1/14. Not retested since. Pt can dialyze at [facility] until COVID tests negative. 2. 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 K 5.0 .TDC removed1/26.Marland Kitchenif time needs to be shortened - it shouldn't be <3.5 3. Anemiaof CKD-Hgb10.8.>9.8 ContinueAranesp to 127mcg qwk. Transfuse prn.tsat 34%. 4. Secondary hyperparathyroidism-Ca/P ok. No binders or VDRA. No parsabiv because not on formulary.  5.HTN/volume-Does not appear volume overloaded. Blood pressure well controlled.Well below EDW, will need to lower at d/c 6. Nutrition- Albumin low2.2. Dys 3 diet Continue renal vit/prostat. And ensure change to NePro With some K^ with ensure  7.LV thrombus-Heparin bridging tocoumadin. Per primary  8.Acute CVA/ hx of LV thrombus (from prior admit)-  etiology unclear. Per primary 9.Disposition: SNF placement when bed available- having placement difficulties.  Ernest Haber, PA-C [facility] Beeper [phone] 09/17/2019,1:17 PM  LOS: 49 days   Labs: Basic Metabolic Panel: Recent Labs  Lab 09/14/19 0432 09/15/19 0713 09/17/19 0530  NA 135 136 131*  K 4.5 4.3 5.0  CL 99 96* 92*  CO2 24 26 25   GLUCOSE 149* 99 223*  BUN 40* 21 55*  CREATININE 7.52* 5.12* 7.87*  CALCIUM 8.2* 8.3* 8.4*  PHOS 4.6 4.7* 3.3   Liver Function Tests: Recent Labs  Lab 09/14/19 0432 09/15/19 0713 09/15/19 0741 09/17/19 0530  AST  --   --  20  --   ALT  --   --  26  --   ALKPHOS  --   --  65  --   BILITOT  --   --  0.4  --   PROT  --   --  6.9  --   ALBUMIN 2.1* 2.2*  --  2.2*   No results for input(s): LIPASE, AMYLASE in the last 168 hours. No results for input(s): AMMONIA in the last 168 hours. CBC: Recent Labs  Lab 09/11/19 0553 09/11/19 0553 09/12/19 0805 09/12/19 0805 09/14/19 1415 09/15/19 0713 09/17/19 0530  WBC 8.4   < > [id number]   < > 6.5 DUPLICATE REQUEST  8.0 7.3  NEUTROABS  --   --  7.7  --   --  5.4  --   HGB 9.5*   < >  9.7*   < > 9.2* DUPLICATE REQUEST  [id number]* 9.8*  HCT 29.9*   < > 30.9*   < > 0000000* DUPLICATE REQUEST  [id number]* 31.1*  MCV 101.7*  --  102.7*  --  0000000* DUPLICATE REQUEST  [id number]* 101.6*  PLT 246   < > [id number]   < > 123456 DUPLICATE REQUEST  [id number]   < > = values in this interval not displayed.   Cardiac Enzymes: No results for input(s): CKTOTAL, CKMB, CKMBINDEX, TROPONINI in the last 168 hours. CBG: Recent Labs  Lab 09/16/19 1132 09/16/19 1703 09/16/19 2204 09/17/19 0717 09/17/19 1137  GLUCAP 229* 292* 141* 246* 354*    Studies/Results: No results found. Medications: . sodium chloride 10 mL/hr at 09/14/19 1008  . sodium chloride    . sodium chloride     .  stroke: mapping our early stages of recovery book   Does not apply Once  . allopurinol  100 mg Oral Daily  . aspirin  81 mg Oral  Daily  . atorvastatin  40 mg Oral Daily  . Chlorhexidine Gluconate Cloth  6 each Topical Q0600  . darbepoetin (ARANESP) injection - DIALYSIS  150 mcg Intravenous Q Tue-HD  . feeding supplement (NEPRO CARB STEADY)  237 mL Oral BID BM  . feeding supplement (PRO-STAT SUGAR FREE 64)  30 mL Oral BID  . insulin aspart  0-15 Units Subcutaneous TID WC  . insulin aspart  0-5 Units Subcutaneous QHS  . insulin aspart  3 Units Subcutaneous TID WC  . metoCLOPramide  5 mg Oral BID WC  . multivitamin  1 tablet Oral QHS  . pantoprazole  40 mg Oral Daily  . predniSONE  5 mg Oral Q breakfast  . sodium chloride flush  3 mL Intravenous Q12H  . warfarin  4 mg Oral ONCE-1800  . Warfarin - Pharmacist Dosing Inpatient   Does not apply [phone]

## 2019-09-17 NOTE — Consult Note (Signed)
[facility] for Infectious Disease    Date of Admission:  07/30/2019     Total days of antibiotics 5               Reason for Consult: Fever   Referring Provider: Earnest Conroy  Primary Care Provider: Marton Redwood, MD   ASSESSMENT:  Jonathan [name] is a 69 y/o male recently completing treatment for COVID-19 with remdesivir and prednisone complicated by findings of small acute infarcts with concern for emboli from left ventricular thrombus which was not seen on TTE on 12/24 and continued intermittent fevers of unclear origin. Certainly endocarditis remains a concern possibility of septic emboli resulting in CVA and also fevers. Recommend TEE to rule out endocarditis and evaluate for LV thrombus as there currently is no other clear source of infection. SLP evaluation with signs/concern for aspiration. He has been afebrile over the past 48 hours and will keep monitoring off antibiotics at the present time.   PLAN:  1. Continue to monitor off antibiotics.  2. TEE to rule out endocarditis and evaluate for LV thrombus 3. ESRD and dialysis per Nephrology   Principal Problem:   Acute respiratory failure due to COVID-19 [facility]) Active Problems:   Chronic hepatitis C without hepatic coma ([name])   Diabetes mellitus type 2, uncomplicated ([name])   History of coronary artery disease   Hypertension   ESRD (end stage renal disease) on dialysis ([name])   Acute respiratory failure with hypoxia ([name])   Cerebral thrombosis with cerebral infarction   Cerebral embolism with cerebral infarction   .  stroke: mapping our early stages of recovery book   Does not apply Once  . allopurinol  100 mg Oral Daily  . aspirin  81 mg Oral Daily  . atorvastatin  40 mg Oral Daily  . Chlorhexidine Gluconate Cloth  6 each Topical Q0600  . darbepoetin (ARANESP) injection - DIALYSIS  150 mcg Intravenous Q Tue-HD  . feeding supplement (NEPRO CARB STEADY)  237 mL Oral BID BM  . feeding supplement (PRO-STAT SUGAR FREE 64)   30 mL Oral BID  . insulin aspart  0-15 Units Subcutaneous TID WC  . insulin aspart  0-5 Units Subcutaneous QHS  . insulin aspart  3 Units Subcutaneous TID WC  . metoCLOPramide  5 mg Oral BID WC  . multivitamin  1 tablet Oral QHS  . pantoprazole  40 mg Oral Daily  . predniSONE  5 mg Oral Q breakfast  . sodium chloride flush  3 mL Intravenous Q12H  . warfarin  4 mg Oral ONCE-1800  . Warfarin - Pharmacist Dosing Inpatient   Does not apply q1800     HPI: Jonathan Kumpe Sr. is a 69 y.o. 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   Afebrile in the ED with chest x-ray showing multifocal pneumonia. MRI brain with small acute infarcts in the left pons, left occipital lobe, and left right frontal lobe. CT angio with no arterial occlusion or high grade stenosis. Neurology consulted and felt may be related to LV thrombus. TTE was negative for thrombus. COVID-19 treated with prednisone and remdesivir. Hospital course has been complicated by intermittent fevers.   Blood  cultures on admission and 09/07/19 were negative. Repeat cultures obtain on 09/15/19 have been without growth. Last fever was on 2/6 with a temperature of 102.8. No current leukocytosis. He was treated for 5 days on 09/11/19 with ceftriaxone with concern for UTI. Previous HD catheter removed on 1/26 and site was without redness or edema. Speech Therapy evaluated with concern for aspiration, however no overt s/s of aspiration found and recommendation for continued regular solids and thin liquids. TTE initially completed on 08/01/19 with no evidence of endocarditis.    Review of Systems: Review  of Systems  Constitutional: Negative for chills, fever and weight loss.  Respiratory: Negative for cough, shortness of breath and wheezing.   Cardiovascular: Negative for chest pain and leg swelling.  Gastrointestinal: Negative for abdominal pain, constipation, diarrhea, nausea and vomiting.  Skin: Negative for rash.     Past Medical History:  Diagnosis Date  . Diabetes mellitus without complication ([place])   . Hypertension   . Renal disorder 2015   right kidney transplant  . Stroke [facility])     Social History   Tobacco Use  . Smoking status: Former Research scientist (life sciences)  . Smokeless tobacco: Never Used  Substance Use Topics  . Alcohol use: Never  . Drug use: Never    Family History  Problem Relation Age of Onset  . Diabetes Mellitus II Mother   . Diabetes Mellitus II Brother     No Known Allergies  OBJECTIVE: Blood pressure 138/76, pulse 96, temperature 99 F (37.2 C), temperature source Oral, resp. rate 18, height 5\' 11"  (1.803 m), weight 63.1 kg, SpO2 98 %.  Physical Exam Constitutional:      General: He is not in acute distress.    Appearance: He is well-developed.  Cardiovascular:     Rate and Rhythm: Normal rate and regular rhythm.     Heart sounds: Normal heart sounds.  Pulmonary:     Effort: Pulmonary effort is normal.     Breath sounds: Normal breath sounds.  Skin:    General: Skin is warm and dry.  Neurological:     Mental Status: He is alert and oriented to person, place, and time.  Psychiatric:        Mood and Affect: Mood normal.     Lab Results Lab Results  Component Value Date   WBC 7.3 09/17/2019   HGB 9.8 (L) 09/17/2019   HCT 31.1 (L) 09/17/2019   MCV 101.6 (H) 09/17/2019   PLT 277 09/17/2019    Lab Results  Component Value Date   CREATININE 7.87 (H) 09/17/2019   BUN 55 (H) 09/17/2019   NA 131 (L) 09/17/2019   K 5.0 09/17/2019   CL 92 (L) 09/17/2019   CO2 25 09/17/2019    Lab Results  Component Value Date   ALT 26 09/15/2019   AST 20  09/15/2019   ALKPHOS 65 09/15/2019   BILITOT 0.4 09/15/2019     Microbiology: Recent Results (from the past 240 hour(s))  Culture, blood (routine x 2)     Status: None   Collection Time: 09/07/19  5:55 PM   Specimen: BLOOD LEFT HAND  Result Value Ref Range Status   Specimen Description BLOOD LEFT HAND  Final   Special Requests   Final    BOTTLES DRAWN AEROBIC AND ANAEROBIC Blood Culture adequate volume   Culture   Final    NO GROWTH 5 DAYS Performed at [facility], [address] [place], [place] [address]    Report  Status 09/12/2019 FINAL  Final  Culture, blood (routine x 2)     Status: None   Collection Time: 09/07/19  6:04 PM   Specimen: BLOOD LEFT HAND  Result Value Ref Range Status   Specimen Description BLOOD LEFT HAND  Final   Special Requests   Final    BOTTLES DRAWN AEROBIC ONLY Blood Culture adequate volume   Culture   Final    NO GROWTH 5 DAYS Performed at [facility], [place] [address] [place], [place] [address]    Report Status 09/12/2019 FINAL  Final  Culture, blood (single)     Status: None (Preliminary result)   Collection Time: 09/15/19 10:16 AM   Specimen: BLOOD LEFT HAND  Result Value Ref Range Status   Specimen Description BLOOD LEFT HAND  Final   Special Requests   Final    AEROBIC BOTTLE ONLY Blood Culture results may not be optimal due to an inadequate volume of blood received in culture bottles   Culture   Final    NO GROWTH 2 DAYS Performed at [facility], [place] [address] [place], [place] [address]    Report Status PENDING  Incomplete  SARS CORONAVIRUS 2 (TAT 6-24 HRS) Nasopharyngeal Nasopharyngeal Swab     Status: None   Collection Time: 09/16/19  1:55 PM   Specimen: Nasopharyngeal Swab  Result Value Ref Range Status   SARS Coronavirus 2 NEGATIVE NEGATIVE Final    Comment: (NOTE) SARS-CoV-2 target nucleic acids are NOT DETECTED. The SARS-CoV-2 RNA is generally detectable in upper and lower respiratory specimens during  the acute phase of infection. Negative results do not preclude SARS-CoV-2 infection, do not rule out co-infections with other pathogens, and should not be used as the sole basis for treatment or other patient management decisions. Negative results must be combined with clinical observations, patient history, and epidemiological information. The expected result is Negative. Fact Sheet for Patients: SugarRoll.be Fact Sheet for Healthcare Providers: https://www.woods-mathews.com/ This test is not yet approved or cleared by the [place] FDA and  has been authorized for detection and/or diagnosis of SARS-CoV-2 by FDA under an Emergency Use Authorization (EUA). This EUA will remain  in effect (meaning this test can be used) for the duration of the COVID-19 declaration under Section 56 4(b)(1) of the Act, 21 U.S.C. section 360bbb-3(b)(1), unless the authorization is terminated or revoked sooner. Performed at [facility], [place] [address], [place], [place] [address]      Terri Piedra, [place] [facility] [phone] Pager  09/17/2019  11:24 AM

## 2019-09-17 NOTE — Progress Notes (Signed)
ANTICOAGULATION CONSULT NOTE - Follow Up Consult  Pharmacy Consult for Coumadin Indication: LV thrombus, CVA  No Known Allergies  Patient Measurements: Height: 5\' 11"  (180.3 cm) Weight: 139 lb 1.8 oz (63.1 kg) IBW/kg (Calculated) : 75.3  Vital Signs: Temp: 99.1 F (37.3 C) (02/09 0546) Temp Source: Oral (02/09 0546) BP: 131/69 (02/09 0546) Pulse Rate: 102 (02/09 0546)  Labs: Recent Labs    09/14/19 1415 09/15/19 0713 09/17/19 [id number]  HGB 9.2* DUPLICATE REQUEST  [id number]*  --   HCT 0000000* DUPLICATE REQUEST  [id number]*  --   PLT 123456 DUPLICATE REQUEST  [id number]  --   LABPROT  --  20.8* 25.0*  INR  --  1.8* 2.3*  CREATININE  --  5.12* 7.87*    Estimated Creatinine Clearance: 8.2 mL/min (A) (by C-G formula based on SCr of 7.87 mg/dL (H)).   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 Pt noted to have recent stroke on MRI 12/23.   INR has been labile, with warfarin held x2 doses on 2/2-2/3 for high INR, then with low INR of 1.8 on 2/7. Phlebotomy unable to draw labs x4 on 2/8, and conservative dose given 2/8. INR now therapeutic today at 2.3. CBC stable (last 2/7). No active bleed issues documented.  Goal of Therapy:  INR 2-3 Monitor platelets by anticoagulation protocol: Yes   Plan:  Warfarin 4mg  PO x 1 dose tonight Monitor daily INR, CBC, s/sx bleeding   Elicia Lamp, PharmD, BCPS Please check AMION for all [place] contact numbers Clinical Pharmacist 09/17/2019 8:29 AM

## 2019-09-18 ENCOUNTER — Encounter (HOSPITAL_COMMUNITY)
Admission: EM | Disposition: A | Discharge status: Skilled Nursing Facility | Payer: Self-pay | Source: Home / Self Care | Attending: Family Medicine

## 2019-09-18 ENCOUNTER — Inpatient Hospital Stay (HOSPITAL_COMMUNITY): Payer: Medicare Other | Admitting: Certified Registered Nurse Anesthetist

## 2019-09-18 ENCOUNTER — Encounter (HOSPITAL_COMMUNITY): Payer: Self-pay | Admitting: Internal Medicine

## 2019-09-18 ENCOUNTER — Inpatient Hospital Stay (HOSPITAL_COMMUNITY): Payer: Medicare Other

## 2019-09-18 DIAGNOSIS — I6340 Cerebral infarction due to embolism of unspecified cerebral artery: Secondary | ICD-10-CM

## 2019-09-18 DIAGNOSIS — R7881 Bacteremia: Secondary | ICD-10-CM

## 2019-09-18 DIAGNOSIS — I10 Essential (primary) hypertension: Secondary | ICD-10-CM

## 2019-09-18 DIAGNOSIS — S301XXA Contusion of abdominal wall, initial encounter: Secondary | ICD-10-CM

## 2019-09-18 DIAGNOSIS — I340 Nonrheumatic mitral (valve) insufficiency: Secondary | ICD-10-CM

## 2019-09-18 HISTORY — PX: TEE WITHOUT CARDIOVERSION: SHX5443

## 2019-09-18 HISTORY — PX: BUBBLE STUDY: SHX6837

## 2019-09-18 LAB — CBC WITH DIFFERENTIAL/PLATELET
Abs Immature Granulocytes: 0.15 10*3/uL — ABNORMAL HIGH (ref 0.00–0.07)
Basophils Absolute: 0.1 10*3/uL (ref 0.0–0.1)
Basophils Relative: 1 %
Eosinophils Absolute: 0.2 10*3/uL (ref 0.0–0.5)
Eosinophils Relative: 2 %
HCT: 35.2 % — ABNORMAL LOW (ref 39.0–52.0)
Hemoglobin: 10.7 g/dL — ABNORMAL LOW (ref 13.0–17.0)
Immature Granulocytes: 2 %
Lymphocytes Relative: 21 %
Lymphs Abs: 2 10*3/uL (ref 0.7–4.0)
MCH: 31.3 pg (ref 26.0–34.0)
MCHC: 30.4 g/dL (ref 30.0–36.0)
MCV: 102.9 fL — ABNORMAL HIGH (ref 80.0–100.0)
Monocytes Absolute: 1 10*3/uL (ref 0.1–1.0)
Monocytes Relative: 11 %
Neutro Abs: 5.9 10*3/uL (ref 1.7–7.7)
Neutrophils Relative %: 63 %
Platelets: 289 10*3/uL (ref 150–400)
RBC: 3.42 MIL/uL — ABNORMAL LOW (ref 4.22–5.81)
RDW: 17.8 % — ABNORMAL HIGH (ref 11.5–15.5)
WBC: 9.2 10*3/uL (ref 4.0–10.5)
nRBC: 0 % (ref 0.0–0.2)

## 2019-09-18 LAB — C-REACTIVE PROTEIN: CRP: 5.5 mg/dL — ABNORMAL HIGH (ref <1.0)

## 2019-09-18 LAB — RENAL FUNCTION PANEL
Albumin: 2.5 g/dL — ABNORMAL LOW (ref 3.5–5.0)
Anion gap: 13 (ref 5–15)
BUN: 32 mg/dL — ABNORMAL HIGH (ref 8–23)
CO2: 28 mmol/L (ref 22–32)
Calcium: 8.6 mg/dL — ABNORMAL LOW (ref 8.9–10.3)
Chloride: 96 mmol/L — ABNORMAL LOW (ref 98–111)
Creatinine, Ser: 5.97 mg/dL — ABNORMAL HIGH (ref 0.61–1.24)
GFR calc Af Amer: 10 mL/min — ABNORMAL LOW (ref >60)
GFR calc non Af Amer: 9 mL/min — ABNORMAL LOW (ref >60)
Glucose, Bld: 91 mg/dL (ref 70–99)
Phosphorus: 3.7 mg/dL (ref 2.5–4.6)
Potassium: 4 mmol/L (ref 3.5–5.1)
Sodium: 137 mmol/L (ref 135–145)

## 2019-09-18 LAB — GLUCOSE, CAPILLARY
Glucose-Capillary: 146 mg/dL — ABNORMAL HIGH (ref 70–99)
Glucose-Capillary: 226 mg/dL — ABNORMAL HIGH (ref 70–99)
Glucose-Capillary: 287 mg/dL — ABNORMAL HIGH (ref 70–99)
Glucose-Capillary: 77 mg/dL (ref 70–99)
Glucose-Capillary: 82 mg/dL (ref 70–99)
Glucose-Capillary: 90 mg/dL (ref 70–99)

## 2019-09-18 LAB — HEPATIC FUNCTION PANEL
ALT: 22 U/L (ref 0–44)
AST: 22 U/L (ref 15–41)
Albumin: 2.4 g/dL — ABNORMAL LOW (ref 3.5–5.0)
Alkaline Phosphatase: 72 U/L (ref 38–126)
Bilirubin, Direct: 0.1 mg/dL (ref 0.0–0.2)
Indirect Bilirubin: 0.6 mg/dL (ref 0.3–0.9)
Total Bilirubin: 0.7 mg/dL (ref 0.3–1.2)
Total Protein: 6.8 g/dL (ref 6.5–8.1)

## 2019-09-18 LAB — MAGNESIUM: Magnesium: 2.1 mg/dL (ref 1.7–2.4)

## 2019-09-18 LAB — SEDIMENTATION RATE: Sed Rate: 73 mm/hr — ABNORMAL HIGH (ref 0–16)

## 2019-09-18 LAB — PROTIME-INR
INR: 2.1 — ABNORMAL HIGH (ref 0.8–1.2)
Prothrombin Time: 23.6 seconds — ABNORMAL HIGH (ref 11.4–15.2)

## 2019-09-18 SURGERY — ECHOCARDIOGRAM, TRANSESOPHAGEAL
Anesthesia: Monitor Anesthesia Care

## 2019-09-18 MED ORDER — NEPRO/CARBSTEADY PO LIQD: 237.0000 mL | Freq: Three times a day (TID) | ORAL | Status: DC

## 2019-09-18 MED ORDER — PROPOFOL 10 MG/ML IV BOLUS
INTRAVENOUS | Status: DC | PRN
  Administered 2019-09-18: 30 mg via INTRAVENOUS
  Administered 2019-09-18: 20 mg via INTRAVENOUS

## 2019-09-18 MED ORDER — METOPROLOL SUCCINATE ER 50 MG PO TB24
50.0000 mg | ORAL_TABLET | Freq: Every day | ORAL | Status: DC
  Administered 2019-09-18 – 2019-09-22 (×5): 50 mg via ORAL
  Filled 2019-09-18 (×5): qty 1

## 2019-09-18 MED ORDER — WARFARIN SODIUM 6 MG PO TABS
6.0000 mg | ORAL_TABLET | Freq: Once | ORAL | Status: AC
  Administered 2019-09-18: 6 mg via ORAL
  Filled 2019-09-18: qty 1

## 2019-09-18 MED ORDER — SODIUM CHLORIDE 0.9 % IV SOLN: INTRAVENOUS | Status: DC

## 2019-09-18 MED ORDER — NEPRO/CARBSTEADY PO LIQD
237.0000 mL | Freq: Three times a day (TID) | ORAL | Status: DC
  Administered 2019-09-18 – 2019-09-22 (×9): 237 mL via ORAL
  Filled 2019-09-18: qty 237

## 2019-09-18 MED ORDER — PROPOFOL 500 MG/50ML IV EMUL
INTRAVENOUS | Status: DC | PRN
  Administered 2019-09-18: 75 ug/kg/min via INTRAVENOUS

## 2019-09-18 NOTE — Progress Notes (Signed)
PT Cancellation Note  Patient Details Name: Jonathan Iley Sr. MRN: [id number] DOB: 02-Oct-1950   Cancelled Treatment:    Reason Eval/Treat Not Completed: Patient at procedure or test/unavailable;Other (comment). Pt was in EGD, then declined x 2 to work with PT.  Try again  as time and pt allow.   Ramond Dial 09/18/2019, 1:11 PM  Mee Hives, PT MS Acute Rehab Dept. Number: [place] and [place]

## 2019-09-18 NOTE — TOC Progression Note (Addendum)
Transition of Care (TOC) - Progression Note    Patient Details  Name: Jonathan Ruffins Sr. MRN: [id number] Date of Birth: October 14, 1950  Transition of Care [facility]) CM/SW Contact  Sharlet Salina Mila Homer, LCSW Phone Number: 09/18/2019, 2:14 PM  Clinical Narrative: Facility search for patient continues. Mr. Gronlund was discussed in length of stay and follow-up is needed with [facility]) and with Trey Sailors with [facility] regarding facility placement for patient. Contact made with Magda Paganini with [facility] and they still  do not have transportation services available. When asked if patient can be taken to HD by wheelchair, she indicated that he could not. Magda Paganini suggested that [facility] be contacted.  Call made to Tammy with [facility] regarding patient. She will look at patient's information (clinicals had been sent through [facility]) and she or Otila Kluver will get back with SW.  Call made to [facility] with [facility] and message left. Emailed Audelia Acton and received a response with [employer] facilities list attached. Call made to [facility], admissions director with [facility] [phone]) and she requested that clinicals be faxed to her [phone]). Clinicals were faxed.   Call made to [facility], admissions director at [facility] and message left. Call made to [facility], admissions director at [facility] and message left. Call made (3:42 pm) to [employer], admissions director at [facility] [phone]) and message left. Call made (4:09 pm) to Darlina Guys, admissions director [phone]) at [facility] in [place], [place] and message left.    Expected Discharge Plan: [place]    Expected Discharge Plan and Services Expected Discharge Plan: [place] arrangements for the past 2 months: Single Family Home                                       Social Determinants of Health (SDOH) Interventions     Readmission Risk Interventions No flowsheet data found.

## 2019-09-18 NOTE — Anesthesia Postprocedure Evaluation (Signed)
Anesthesia Post Note  Patient: Jonathan Biehn Sr.  Procedure(s) Performed: TRANSESOPHAGEAL ECHOCARDIOGRAM (TEE) (N/A ) BUBBLE STUDY     Patient location during evaluation: Endoscopy Anesthesia Type: MAC Level of consciousness: awake and alert Pain management: pain level controlled Vital Signs Assessment: post-procedure vital signs reviewed and stable Respiratory status: spontaneous breathing, nonlabored ventilation and respiratory function stable Cardiovascular status: blood pressure returned to baseline and stable Postop Assessment: no apparent nausea or vomiting Anesthetic complications: no    Last Vitals:  Vitals:   09/18/19 1010 09/18/19 1052  BP: 132/71 136/74  Pulse: (!) 105 (!) 103  Resp: (!) 21 20  Temp:  36.7 C  SpO2: 96% 94%    Last Pain:  Vitals:   09/18/19 1052  TempSrc: Oral  PainSc:                  Lidia Collum

## 2019-09-18 NOTE — Progress Notes (Signed)
PROGRESS NOTE    Jonathan Merfeld Sr.  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 MRI brain showed small infarct in the left pons left occiput. Neurology saw the patient and felt this was secondary to left ventricular thrombus ( 2D echo did not show any clots or PFO); he was started on heparin. He was diagnosed with Covid infection on 07/24/2019 and was treated for Infection at that time. Patient has recurrently tested positive -probably positivity from his index infection in December.Hospital course complicated by fever spikes on 1/30, 2/1,2/3 , 2/5 and 2/6 (102.50F).  Infectious diseases evaluated and ordered MRI and recommending monitoring off of antibiotics.  Hospitalization is further been complicated by left psoas hematoma.  TEE was done today and showed no evidence of any cardiac embolism with no evidence of any clot.  Orthopedics to evaluate in the morning and PT OT recommending SNF.   Assessment & Plan:   Principal Problem:   FUO (fever of unknown origin) Active Problems:   Chronic hepatitis C without hepatic coma ([name])   Diabetes mellitus type 2, uncomplicated ([name])   History of coronary artery disease   Hypertension   ESRD (end stage renal disease) on dialysis ([name])   Acute respiratory failure due to COVID-19 [facility])   Acute respiratory failure with hypoxia ([name])   Cerebral thrombosis with cerebral infarction   Cerebral embolism with cerebral infarction  Acute left pons and left occipital strokes -Etiology unclear but though to be secondary to Covid induced hypercoaguablilty versus LV thrombus. -Neurology saw the patient and felt this was secondary to left  ventricular thrombus;  -He was started on heparin which had been changed to Eliquis and now Coumadin -Continue ASA, atorvastatin 40 mg p.o. daily, Eliquis changed to coumadin;  -Coumadin per Pharmacy Dosing -SNF placement recommended by PT  Fevers -Secondary to aspiration vs acute infections -.Urine culture NGTD. However, patient received Rocephin x 5 daysfor presumed UTI/aspiration during [facility]  -Blood cultures 1/30, 2/7 after fevers, negative to date. 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  -HD catheter removed 1/26 and site looks okay with no redness but has mild draiange.  -Given transplant h/o and concern for recurrent fevers, consulted ID.  -Consult speech to r/o ongoing aspiration.  -CT chest /abd/pelvis ordered (changed to WO contrast as radiology woundnt give contrast if patient been on dialysis for <50months) per d/w Dr Tommy Medal and showed "Left psoas intramuscular hematoma. Multifocal pneumonia, likely viral etiology including COVID-19. Clinical correlation is recommended. 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 Cardiology consult and further evaluation with echocardiogram recommended. Coronary vascular calcification and Aortic Atherosclerosis. Cholelithiasis. No hydronephrosis or nephrolithiasis. Right lower quadrant transplant kidney appears slightly atrophic. Mildly thickened bladder wall. Correlation with urinalysis recommended to exclude UTI." -MRI ordered due concern for Abscesses and showed "Large left psoas hematoma, similar in size compared to prior CT. No evidence of discitis/osteomyelitis. Degenerative disc disease at L3-4 and L4-5 as described above, worst at L4-5 where there is mild spinal canal stenosis, mild right and mild to  moderate left  neural foraminal narrowing." -Infectious disease evaluated and recommends watching off of antibiotics  Large Left Psoas Hematoma -Continue with anticoagulation this point given his left ventricular thrombus and need for anticoagulation -hemoglobin/hematocrit remains relatively stable -Discussed with Dr. Percell Miller of orthopedics who will formally see the patient in the morning and recommends currently no indication for drainage at this point   Recent COVID-19 pneumonia -Initially tested positive in December 2020 and completed appropriate treatment.  -No longer infectious and off of precautions at this point.  -Assumed that patient could continue to test positive for 90 days from his initial positive test.  -SNF wants 2 Negative Tests prior to D/C -SpO2: 96 % O2 Flow Rate (L/min): 2 L/min  Recent Labs    09/18/19 1418  CRP 5.5*    Lab Results  Component Value Date   SARSCOV2NAA NEGATIVE 09/16/2019   SARSCOV2NAA POSITIVE (A) 08/22/2019   SARSCOV2NAA POSITIVE (A) 07/24/2019  -Continue monitor chest x-ray intermittently but currently is improved from a respiratory standpoint -Continue Xopenex 2 puffs elations every 8 hours as needed for wheezing  ?LV thrombus -Not seen on echo, not sure if TEE deferred due to [place].  -Coumadin per pharmacy -TEE done and showed no hemipericardium and there is no cardiac source of embolism noted  ESRD/HD TTSa -Nephrology following. Per renal patient can dialyze at [facility] until COVID tests negative (Tested Negative on 09/16/19) -Patient's BUN/creatinine went from 55/7.87 is now 32/5.97 -Continue with renal/carb modified diet with 1200 mL fluid restriction -Nephrology continuing darbepoetin alpha 150 mcg every Tuesday for hemodialysis -Continue with Rena-Vit -Patient to dialyze tomorrow  Prior cadaveric renal transplant -Continue prednisone. Fever w/u as above  Hyperglycemia, Pre-diabetes -Continue moderate NovoLog sliding  scale insulin before meals and at bedtime -CBGs ranging from 77-2 26  HTN -Currently his losartan 25 g p.o. daily has been held -He is on metoprolol succinate 50 mg p.o. daily but this was held and will resume -Change to monitor blood pressures very carefully  DVT prophylaxis: Anticoagulated with  Coumadin Code Status: FULL CODE Family Communication: No family present at bedside  Disposition Plan: Patient presented from home and currently requires SNF when is medically stable and stop spiking temperatures and tests negative.  Discharge barriers due to finding SNF placement as patient continues to spike his temperatures; findings tested negative and will repeat again prior to discharge  Consultants:   Cardiology  Infectious Diseases   Nephrology   Orthopedic Surgery   Procedures:  ECHOCARDIOGRAM 08/01/2019 IMPRESSIONS    1. Left ventricular ejection fraction, by visual estimation, is 30 to  35%. The left ventricle has moderate to severely decreased function. There  is no left ventricular hypertrophy.  2. Severe dyskinesis of the left ventricular, entire apical segment.  3. Apical left ventricular aneurysm.  4. Definity contrast agent was given IV to delineate the left ventricular  endocardial borders.  5. No intracardiac thrombi or masses were visualized.  6. Left ventricular diastolic parameters are consistent with Grade I  diastolic dysfunction (impaired relaxation).  7. Global right ventricle has normal systolic function.The right  ventricular size is normal. No increase in right ventricular wall  thickness.  8. Left atrial size was normal.  9. Right atrial size was normal.  10. The mitral valve is normal in structure. Mild mitral valve  regurgitation. No evidence of mitral stenosis.  11. The tricuspid valve is normal in structure.  12. The aortic valve is normal in structure. Aortic valve regurgitation is  trivial. No evidence of  aortic valve sclerosis or  stenosis.  13. The pulmonic valve was normal in structure. Pulmonic valve  regurgitation is not visualized.  14. The inferior vena cava is normal in size with greater than 50%  respiratory variability, suggesting right atrial pressure of 3 mmHg.   FINDINGS  Left Ventricle: Left ventricular ejection fraction, by visual estimation,  is 30 to 35%. The left ventricle has moderate to severely decreased  function. Severe dyskinesis of the left ventricular, entire apical  segment. Definity contrast agent was given  IV to delineate the left ventricular endocardial borders. The left  ventricle demonstrates regional wall motion abnormalities. There is no  left ventricular hypertrophy. Left ventricular diastolic parameters are  consistent with Grade I diastolic  dysfunction (impaired relaxation). Normal left atrial pressure. Apical  aneurysm can be seen.   Right Ventricle: The right ventricular size is normal. No increase in  right ventricular wall thickness. Global RV systolic function is has  normal systolic function.   Left Atrium: Left atrial size was normal in size.   Right Atrium: Right atrial size was normal in size   Pericardium: There is no evidence of pericardial effusion.   Mitral Valve: The mitral valve is normal in structure. Mild mitral valve  regurgitation. No evidence of mitral valve stenosis by observation.   Tricuspid Valve: The tricuspid valve is normal in structure. Tricuspid  valve regurgitation is not demonstrated.   Aortic Valve: The aortic valve is normal in structure. Aortic valve  regurgitation is trivial. The aortic valve is structurally normal, with no  evidence of sclerosis or stenosis.   Pulmonic Valve: The pulmonic valve was normal in structure. Pulmonic valve  regurgitation is not visualized. Pulmonic regurgitation is not visualized.   Aorta: The aortic root, ascending aorta and aortic arch are all  structurally normal, with no evidence of dilitation or  obstruction.   Venous: The inferior vena cava is normal in size with greater than 50%  respiratory variability, suggesting right atrial pressure of 3 mmHg.   IAS/Shunts: No atrial level shunt detected by color flow Doppler. There is  no evidence of a patent foramen ovale. No ventricular septal defect is  seen or detected. There is no evidence of an atrial septal defect.   Additional Comments: No intracardiac thrombi or masses were visualized.      LV Volumes (MOD)  LV area d, A2C:  44.00 cm Diastology  LV area d, A4C:  43.50 cm LV e' lateral:  5.43 cm/s  LV area s, A2C:  34.10 cm LV E/e' lateral: 10.6  LV area s, A4C:  34.50 cm LV e' medial:  4.05 cm/s  LV major d, A2C:  9.28 cm  LV E/e' medial: 14.2  LV major d, A4C:  9.21 cm  LV major s, A2C:  8.39 cm  LV major s, A4C:  8.13 cm  LV vol d, MOD A2C: 173.0 ml  LV vol d, MOD A4C: 169.0 ml  LV vol s, MOD A2C: 113.0 ml  LV vol s, MOD A4C: 117.0 ml  LV SV MOD A2C:   60.0 ml  LV SV MOD A4C:   169.0 ml  LV SV MOD BP:   54.4 ml   RIGHT VENTRICLE  RV S prime:   11.40 cm/s  TAPSE (M-mode): 2.0 cm   LEFT ATRIUM       Index    RIGHT ATRIUM      Index  LA Vol (A2C):  66.6 ml 33.83 ml/m RA Area:  14.30 cm  LA Vol (A4C):  55.4 ml 28.14 ml/m RA Volume:  34.50 ml 17.52 ml/m  LA Biplane Vol: 62.2 ml 31.59 ml/m  AORTIC VALVE  LVOT Vmax:  91.40 cm/s  LVOT Vmean: 58.400 cm/s  LVOT VTI:  0.221 m   MITRAL VALVE  MV Area (PHT): 3.21 cm       SHUNTS  MV PHT:    68.44 msec      Systemic VTI: 0.22 m  MV Decel Time: 236 msec  MV E velocity: 57.40 cm/s 103 cm/s  MV A velocity: 82.30 cm/s 70.3 cm/s  MV E/A ratio: 0.70    1.5    TEE LEFT VENTRICLE: EF = 30-35%. At least moderate LVH. Apical hypokinesis with aneurysm  RIGHT VENTRICLE: Normal size and function.   LEFT ATRIUM: No thrombus/mass.  LEFT ATRIAL APPENDAGE: No thrombus/mass.   RIGHT ATRIUM: No  thrombus/mass.  AORTIC VALVE:  Trileaflet. Trivial regurgitation. No vegetation.  MITRAL VALVE:    Normal structure. Mild regurgitation. No vegetation.  TRICUSPID VALVE: Normal structure. Trivial regurgitation. No vegetation.  PULMONIC VALVE: Grossly normal structure. Trivial regurgitation. No apparent vegetation.  INTERATRIAL SEPTUM: No PFO or ASD seen by color Doppler. Negative bubble study for right to left shunt.  PERICARDIUM: No effusion noted.  DESCENDING AORTA: Mild diffuse plaque seen   CONCLUSION: no cardiac source of embolism   Antimicrobials:  Anti-infectives (From admission, onward)   Start     Dose/Rate Route Frequency Ordered Stop   09/11/19 0830  cefTRIAXone (ROCEPHIN) 1 g in sodium chloride 0.9 % 100 mL IVPB     1 g 200 mL/hr over 30 Minutes Intravenous Daily 09/11/19 0726 09/15/19 0959   07/31/19 1000  remdesivir 100 mg in sodium chloride 0.9 % 100 mL IVPB     100 mg 200 mL/hr over 30 Minutes Intravenous Daily 07/30/19 2217 08/04/19 0748   07/31/19 0800  ceFEPIme (MAXIPIME) 1 g in sodium chloride 0.9 % 100 mL IVPB  Status:  Discontinued     1 g 200 mL/hr over 30 Minutes Intravenous Every 24 hours 07/31/19 0748 07/31/19 0816   07/31/19 0800  vancomycin (VANCOREADY) IVPB 1500 mg/300 mL  Status:  Discontinued     1,500 mg 150 mL/hr over 120 Minutes Intravenous  Once 07/31/19 0748 07/31/19 0816   07/31/19 0748  vancomycin variable dose per unstable renal function (pharmacist dosing)  Status:  Discontinued      Does not apply See admin instructions 07/31/19 0748 07/31/19 0816   07/30/19 2230  remdesivir 200 mg in sodium chloride 0.9% 250 mL IVPB     200 mg 580 mL/hr over 30 Minutes Intravenous Once 07/30/19 2217 07/31/19 0045     Subjective: Seen and examined at bedside and he is doing well after his TEE.  Denies any chest pain, lightheadedness or dizziness.  No nausea or vomiting.  Had no complaints.  No other concerns or plans at this  time.  Objective: Vitals:   09/17/19 1530 09/17/19 1641 09/17/19 2208 09/18/19 0452  BP: 96/62 115/65 114/69 125/68  Pulse: (!) 102 89 (!) 111 97  Resp:  17 16 16   Temp: 97.6 F (36.4 C) 98 F (36.7 C) 99 F (37.2 C) 97.9 F (36.6 C)  TempSrc: Oral Oral Oral Oral  SpO2: 96% 100% 96% 97%  Weight: 65.6 kg     Height:        Intake/Output Summary (Last 24 hours) at 09/18/2019 0817 Last data filed at 09/17/2019 1530 Gross per 24  hour  Intake --  Output 2200 ml  Net -2200 ml   Filed Weights   09/16/19 0500 09/17/19 1215 09/17/19 1530  Weight: 63.1 kg 68.3 kg 65.6 kg   Examination: Physical Exam:  Constitutional: WN/WD elderly African-American male currently in NAD and appears calm Eyes: Lids and conjunctivae normal, sclerae anicteric  ENMT: External Ears, Nose appear normal. Grossly normal hearing.  Neck: Appears normal, supple, no cervical masses, normal ROM, no appreciable thyromegaly; no JVD Respiratory: Diminished to auscultation bilaterally with some coarse breath sounds, no wheezing, rales, rhonchi or crackles. Normal respiratory effort and patient is not tachypenic. No accessory muscle use.  Cardiovascular: RRR, no murmurs / rubs / gallops. S1 and S2 auscultated. No extremity edema Abdomen: Soft, non-tender, Distended slightly. Bowel sounds positive x4.  GU: Deferred. Musculoskeletal: No clubbing / cyanosis of digits/nails. No joint deformity upper and lower extremities.  Skin: No rashes, lesions, ulcers on a limited skin evaluation. No induration; Warm and dry.  Neurologic: CN 2-12 grossly intact with no focal deficits.Romberg sign and cerebellar reflexes not assessed.  Psychiatric: Normal judgment and insight. Alert and oriented x 3. Normal mood and appropriate affect.   Data Reviewed: I have personally reviewed following labs and imaging studies  CBC: Recent Labs  Lab 09/12/19 0805 09/14/19 1415 09/15/19 0713 09/17/19 0530 09/17/19 1239  WBC [id number] 6.5  DUPLICATE REQUEST   8.0 7.3 6.7  NEUTROABS 7.7  --  5.4  --   --   HGB 9.7* 9.2* DUPLICATE REQUEST   [id number]* 9.8* 9.5*  HCT 123456* 0000000* DUPLICATE REQUEST   [id number]* 31.1* 30.9*  MCV 0000000* 0000000* DUPLICATE REQUEST   [id number]* 101.6* 102.7*  PLT [id number] DUPLICATE REQUEST   [id number]   Basic Metabolic Panel: Recent Labs  Lab 09/13/19 0408 09/14/19 0432 09/15/19 0713 09/15/19 0741 09/17/19 0530 09/18/19 0518  NA 136 135 136  --  131* 137  K 4.3 4.5 4.3  --  5.0 4.0  CL 99 99 96*  --  92* 96*  CO2 25 24 26   --  25 28  GLUCOSE 119* 149* 99  --  223* 91  BUN 26* 40* 21  --  55* 32*  CREATININE 5.62* 7.52* 5.12*  --  7.87* 5.97*  CALCIUM 8.3* 8.2* 8.3*  --  8.4* 8.6*  MG  --   --   --  1.9  --   --   PHOS 5.3* 4.6 4.7*  --  3.3 3.7   GFR: Estimated Creatinine Clearance: 11.3 mL/min (A) (by C-G formula based on SCr of 5.97 mg/dL (H)). Liver Function Tests: Recent Labs  Lab 09/13/19 0408 09/14/19 0432 09/15/19 0713 09/15/19 0741 09/17/19 0530 09/18/19 0518  AST  --   --   --  20  --   --   ALT  --   --   --  26  --   --   ALKPHOS  --   --   --  65  --   --   BILITOT  --   --   --  0.4  --   --   PROT  --   --   --  6.9  --   --   ALBUMIN 2.2* 2.1* 2.2*  --  2.2* 2.5*   No results for input(s): LIPASE, AMYLASE in the last 168 hours. No results for input(s): AMMONIA in the last 168 hours. Coagulation Profile: Recent Labs  Lab 09/13/19 0408 09/14/19 0432 09/15/19 0713 09/17/19  0530 09/18/19 0518  INR 2.3* 2.1* 1.8* 2.3* 2.1*   Cardiac Enzymes: No results for input(s): CKTOTAL, CKMB, CKMBINDEX, TROPONINI in the last 168 hours. BNP (last 3 results) No results for input(s): PROBNP in the last 8760 hours. HbA1C: No results for input(s): HGBA1C in the last 72 hours. CBG: Recent Labs  Lab 09/17/19 1642 09/17/19 2209 09/18/19 0001 09/18/19 0453 09/18/19 0732  GLUCAP 149* 56* 82 90 77   Lipid Profile: No results for input(s): CHOL, HDL, LDLCALC, TRIG, CHOLHDL,  LDLDIRECT in the last 72 hours. Thyroid Function Tests: No results for input(s): TSH, T4TOTAL, FREET4, T3FREE, THYROIDAB in the last 72 hours. Anemia Panel: No results for input(s): VITAMINB12, FOLATE, FERRITIN, TIBC, IRON, RETICCTPCT in the last 72 hours. Sepsis Labs: Recent Labs  Lab 09/15/19 0741 09/15/19 1016 09/15/19 1326  PROCALCITON 1.49  --   --   LATICACIDVEN  --  2.0* 1.5    Recent Results (from the past 240 hour(s))  Culture, blood (single)     Status: None (Preliminary result)   Collection Time: 09/15/19 10:16 AM   Specimen: BLOOD LEFT HAND  Result Value Ref Range Status   Specimen Description BLOOD LEFT HAND  Final   Special Requests   Final    AEROBIC BOTTLE ONLY Blood Culture results may not be optimal due to an inadequate volume of blood received in culture bottles   Culture   Final    NO GROWTH 2 DAYS Performed at [facility], [place] [address] [place], [place] [address]    Report Status PENDING  Incomplete  SARS CORONAVIRUS 2 (TAT 6-24 HRS) Nasopharyngeal Nasopharyngeal Swab     Status: None   Collection Time: 09/16/19  1:55 PM   Specimen: Nasopharyngeal Swab  Result Value Ref Range Status   SARS Coronavirus 2 NEGATIVE NEGATIVE Final    Comment: (NOTE) SARS-CoV-2 target nucleic acids are NOT DETECTED. The SARS-CoV-2 RNA is generally detectable in upper and lower respiratory specimens during the acute phase of infection. Negative results do not preclude SARS-CoV-2 infection, do not rule out co-infections with other pathogens, and should not be used as the sole basis for treatment or other patient management decisions. Negative results must be combined with clinical observations, patient history, and epidemiological information. The expected result is Negative. Fact Sheet for Patients: SugarRoll.be Fact Sheet for Healthcare Providers: https://www.woods-mathews.com/ This test is not yet approved or cleared by  the [place] FDA and  has been authorized for detection and/or diagnosis of SARS-CoV-2 by FDA under an Emergency Use Authorization (EUA). This EUA will remain  in effect (meaning this test can be used) for the duration of the COVID-19 declaration under Section 56 4(b)(1) of the Act, 21 U.S.C. section 360bbb-3(b)(1), unless the authorization is terminated or revoked sooner. Performed at [facility], [place] [address] [place], [place] [address]      RN Pressure Injury Documentation:     Estimated body mass index is 20.17 kg/m as calculated from the following:   Height as of this encounter: 5\' 11"  (1.803 m).   Weight as of this encounter: 65.6 kg.  Malnutrition Type:  Nutrition Problem: Increased nutrient needs Etiology: chronic illness(ESRD on HD)   Malnutrition Characteristics:  Signs/Symptoms: estimated needs   Nutrition Interventions:  Interventions: Ensure Enlive (each supplement provides 350kcal and 20 grams of protein), MVI   Radiology Studies: CT ABDOMEN PELVIS WO CONTRAST  Result Date: 09/17/2019 CLINICAL DATA:  69 year old male with abdominal pain and fever. History of COVID-19.  EXAM: CT CHEST, ABDOMEN AND PELVIS WITHOUT CONTRAST TECHNIQUE: Multidetector CT imaging of the chest, abdomen and pelvis was performed following the standard protocol without IV contrast. COMPARISON:  Chest radiograph dated 09/15/2019. FINDINGS: Evaluation of this exam is limited in the absence of intravenous contrast. CT CHEST FINDINGS Cardiovascular: There is no cardiomegaly. There is a high attenuating pericardial effusion adjacent to the left ventricle measuring approximately 13 mm in thickness. This may represent hemorrhagic fluid. Clinical correlation and further evaluation with echocardiogram recommended. 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 Three vessel coronary vascular calcification noted. There is atherosclerotic calcification of thoracic  aorta. The central pulmonary arteries are grossly unremarkable. Mediastinum/Nodes: No hilar or mediastinal adenopathy. The esophagus and the thyroid gland are grossly unremarkable. No mediastinal fluid collection. 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 Bilateral hazy and streaky densities most concerning for atypical infection and in keeping with history of COVID-19. Clinical correlation is recommended. No lobar consolidation, pleural effusion, or pneumothorax. The central airways are patent. Musculoskeletal: No chest wall mass or suspicious bone lesions identified. CT ABDOMEN PELVIS FINDINGS No intra-abdominal free air or free fluid. Hepatobiliary: The liver is unremarkable. No intrahepatic biliary ductal dilatation. There are multiple small stones in the gallbladder. No pericholecystic fluid or evidence of acute cholecystitis by CT. Ultrasound may provide better evaluation. Pancreas: Unremarkable. No pancreatic ductal dilatation or surrounding inflammatory changes. Spleen: Normal in size without focal abnormality. Adrenals/Urinary Tract: The adrenal glands are unremarkable. Moderate bilateral renal parenchyma atrophy. There is no hydronephrosis or nephrolithiasis on either side. Small bilateral renal hypodense lesions are suboptimally characterized on this noncontrast CT. The visualized ureters appear unremarkable. The urinary bladder is minimally distended. There is apparent diffuse thickening of the bladder wall which may be partly related to underdistention. Cystitis is not excluded. Correlation with urinalysis recommended. 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 There is no hydronephrosis or nephrolithiasis of the transplant kidney. No peritransplant fluid collection identified. The transplant kidney however appears slightly small. Stomach/Bowel: Moderate amount of stool noted throughout the colon. Small scattered colonic diverticula without active inflammatory changes noted.  There is no bowel obstruction or active inflammation. Normal appendix. Vascular/Lymphatic: Advanced aortoiliac atherosclerotic disease. The IVC is unremarkable. No portal venous gas. There is no adenopathy. Reproductive: The prostate and seminal vesicles are grossly unremarkable. Other: There is a left psoas intramuscular hematoma measuring 5.8 x 5.0 cm in greatest axial dimensions and approximately 10 cm in craniocaudal length. Musculoskeletal: Degenerative changes of the spine primarily at L4-L5. No acute osseous pathology. IMPRESSION: 1. Left psoas intramuscular hematoma. 2. Multifocal pneumonia, likely viral etiology including COVID-19. Clinical correlation is recommended. 3. 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 Cardiology consult and further evaluation with echocardiogram recommended. 4. Coronary vascular calcification and Aortic Atherosclerosis (ICD10-I70.0). 5. Cholelithiasis. 6. No hydronephrosis or nephrolithiasis. Right lower quadrant transplant kidney appears slightly atrophic. 7. Mildly thickened bladder wall. Correlation with urinalysis recommended to exclude UTI. These results were called by telephone at the time of interpretation on 09/17/2019 at 9:39 pm to patient's nurse Ruthann Cancer, who verbally acknowledged these results. Electronically Signed   By: Anner Crete M.D.   On: 09/17/2019 21:50   CT CHEST WO CONTRAST  Result Date: 09/17/2019 CLINICAL DATA:  69 year old male with abdominal pain and fever. History of COVID-19. EXAM: CT CHEST, ABDOMEN AND PELVIS WITHOUT CONTRAST TECHNIQUE: Multidetector CT imaging of the chest, abdomen and pelvis was performed following the standard protocol  without IV contrast. COMPARISON:  Chest radiograph dated 09/15/2019. FINDINGS: Evaluation of this exam is limited in the absence of intravenous contrast. CT CHEST FINDINGS Cardiovascular: There is no cardiomegaly. There is a high attenuating  pericardial effusion adjacent to the left ventricle measuring approximately 13 mm in thickness. This may represent hemorrhagic fluid. Clinical correlation and further evaluation with echocardiogram recommended. 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 Three vessel coronary vascular calcification noted. There is atherosclerotic calcification of thoracic aorta. The central pulmonary arteries are grossly unremarkable. Mediastinum/Nodes: No hilar or mediastinal adenopathy. The esophagus and the thyroid gland are grossly unremarkable. No mediastinal fluid collection. 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 Bilateral hazy and streaky densities most concerning for atypical infection and in keeping with history of COVID-19. Clinical correlation is recommended. No lobar consolidation, pleural effusion, or pneumothorax. The central airways are patent. Musculoskeletal: No chest wall mass or suspicious bone lesions identified. CT ABDOMEN PELVIS FINDINGS No intra-abdominal free air or free fluid. Hepatobiliary: The liver is unremarkable. No intrahepatic biliary ductal dilatation. There are multiple small stones in the gallbladder. No pericholecystic fluid or evidence of acute cholecystitis by CT. Ultrasound may provide better evaluation. Pancreas: Unremarkable. No pancreatic ductal dilatation or surrounding inflammatory changes. Spleen: Normal in size without focal abnormality. Adrenals/Urinary Tract: The adrenal glands are unremarkable. Moderate bilateral renal parenchyma atrophy. There is no hydronephrosis or nephrolithiasis on either side. Small bilateral renal hypodense lesions are suboptimally characterized on this noncontrast CT. The visualized ureters appear unremarkable. The urinary bladder is minimally distended. There is apparent diffuse thickening of the bladder wall which may be partly related to underdistention. Cystitis is not excluded. Correlation with urinalysis  recommended. 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 There is no hydronephrosis or nephrolithiasis of the transplant kidney. No peritransplant fluid collection identified. The transplant kidney however appears slightly small. Stomach/Bowel: Moderate amount of stool noted throughout the colon. Small scattered colonic diverticula without active inflammatory changes noted. There is no bowel obstruction or active inflammation. Normal appendix. Vascular/Lymphatic: Advanced aortoiliac atherosclerotic disease. The IVC is unremarkable. No portal venous gas. There is no adenopathy. Reproductive: The prostate and seminal vesicles are grossly unremarkable. Other: There is a left psoas intramuscular hematoma measuring 5.8 x 5.0 cm in greatest axial dimensions and approximately 10 cm in craniocaudal length. Musculoskeletal: Degenerative changes of the spine primarily at L4-L5. No acute osseous pathology. IMPRESSION: 1. Left psoas intramuscular hematoma. 2. Multifocal pneumonia, likely viral etiology including COVID-19. Clinical correlation is recommended. 3. 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 Cardiology consult and further evaluation with echocardiogram recommended. 4. Coronary vascular calcification and Aortic Atherosclerosis (ICD10-I70.0). 5. Cholelithiasis. 6. No hydronephrosis or nephrolithiasis. Right lower quadrant transplant kidney appears slightly atrophic. 7. Mildly thickened bladder wall. Correlation with urinalysis recommended to exclude UTI. These results were called by telephone at the time of interpretation on 09/17/2019 at 9:39 pm to patient's nurse Ruthann Cancer, who verbally acknowledged these results. Electronically Signed   By: Anner Crete M.D.   On: 09/17/2019 21:50   Scheduled Meds:   stroke: mapping our early stages of recovery book   Does not apply Once   allopurinol  100 mg Oral Daily   aspirin  81 mg Oral Daily    atorvastatin  40 mg Oral Daily   Chlorhexidine Gluconate Cloth  6 each Topical Q0600   darbepoetin (ARANESP) injection - DIALYSIS  150 mcg Intravenous Q Tue-HD   feeding  supplement (NEPRO CARB STEADY)  237 mL Oral BID BM   feeding supplement (PRO-STAT SUGAR FREE 64)  30 mL Oral BID   insulin aspart  0-15 Units Subcutaneous TID WC   insulin aspart  0-5 Units Subcutaneous QHS   insulin aspart  3 Units Subcutaneous TID WC   metoCLOPramide  5 mg Oral BID WC   multivitamin  1 tablet Oral QHS   pantoprazole  40 mg Oral Daily   predniSONE  5 mg Oral Q breakfast   sodium chloride flush  3 mL Intravenous Q12H   Warfarin - Pharmacist Dosing Inpatient   Does not apply q1800   Continuous Infusions:  sodium chloride 10 mL/hr at 09/14/19 1008    LOS: 50 days   Kerney Elbe, DO [facility] PAGER is on [place]  If 7PM-7AM, please contact night-coverage [email or web address]

## 2019-09-18 NOTE — Transfer of Care (Signed)
Immediate Anesthesia Transfer of Care Note  Patient: Jonathan Senske Sr.  Procedure(s) Performed: TRANSESOPHAGEAL ECHOCARDIOGRAM (TEE) (N/A ) BUBBLE STUDY  Patient Location: Endoscopy Unit  Anesthesia Type:MAC  Level of Consciousness: drowsy and patient cooperative  Airway & Oxygen Therapy: Patient Spontanous Breathing and Patient connected to nasal cannula oxygen  Post-op Assessment: Report given to RN, Post -op Vital signs reviewed and stable and Patient moving all extremities X 4  Post vital signs: Reviewed and stable  Last Vitals:  Vitals Value Taken Time  BP 121/65 09/18/19 0958  Temp 36.6 C 09/18/19 0958  Pulse 104 09/18/19 0958  Resp 27 09/18/19 0958  SpO2 97 % 09/18/19 0958    Last Pain:  Vitals:   09/18/19 0958  TempSrc: Oral  PainSc: 0-No pain         Complications: No apparent anesthesia complications

## 2019-09-18 NOTE — Progress Notes (Signed)
      INFECTIOUS DISEASE ATTENDING ADDENDUM:   Date: 09/18/2019  Patient name: Jonathan Konda Sr.  Medical record number: [id number]  Date of birth: 27-Nov-1950   MRI lumbar spine report report read.  Still thought to be hematoma.  Would observe off antibiotics.  We will sign off for now please call with further questions.    Alcide Evener 09/18/2019, 4:18 PM

## 2019-09-18 NOTE — Progress Notes (Signed)
OT Cancellation Note  Patient Details Name: Jonathan Lawes Sr. MRN: [id number] DOB: Jan 02, 1951   Cancelled Treatment:    Reason Eval/Treat Not Completed: Patient at procedure or test/ unavailable;Other (comment) Will check back as time allows for OT session.  Lanier Clam., COTA/L Acute Rehabilitation Services [phone]   Ihor Gully 09/18/2019, 10:04 AM

## 2019-09-18 NOTE — Progress Notes (Signed)
  Echocardiogram 2D Echocardiogram has been performed.  Jannett Celestine 09/18/2019, 10:08 AM

## 2019-09-18 NOTE — Progress Notes (Signed)
Nutrition Follow up   DOCUMENTATION CODES:   Not applicable(suspect PCM)  INTERVENTION:  Recommend Cortrak placement if intake remains poor    Continue Nepro Shake po TID, each supplement provides 425 kcal and 19 grams protein  Continue 30 ml Prostat BID, each supplement provides 100 kcals and 15 grams protein.   Continue renal MVI daily   NUTRITION DIAGNOSIS:   Increased nutrient needs related to chronic illness(ESRD on HD) as evidenced by estimated needs.  Ongoing  GOAL:   Patient will meet greater than or equal to 90% of their needs   Progressing   MONITOR:   PO intake, Supplement acceptance, Weight trends, Labs, I & O's, Skin  REASON FOR ASSESSMENT:   LOS    ASSESSMENT:   Patient with PMH significant for R kidney transplant, ESRD on HD, CVA, HTN, and DM. Presents this admission with COVID 19 PNA.   Pt for TEE today.   Denies loss of appetite. Reports he's finishing 100% of meals and drinking Nepro 2-3 times daily. Meal completions charted as 25-100% for his last eight meals (53% average). Per doc flowsheet pt drinking 2 Nepros consistently. Encouraged pt to try drinking three daily as meal intake remains inadequate and weight continues to trend down. Pt willing to try. Does not wish to have tube placed.   EDW: 80.5 kg  Current weight: 65.6 kg  I/O: -2,560 ml since 1/27 UOP: 200 ml x 24 hrs  Last HD yesterday: 2000 ml net UF   Medications: aranesp, SS novolog, reglan, rena-vit, prednisone Labs: CBG 56-149  Diet Order:   Diet Order            Diet renal/carb modified with fluid restriction Diet-HS Snack? Nothing; Fluid restriction: 1200 mL Fluid; Room service appropriate? Yes; Fluid consistency: Thin  Diet effective now              EDUCATION NEEDS:   Not appropriate for education at this time  Skin:  Skin Assessment: Reviewed RN Assessment  Last BM:  2/8  Height:   Ht Readings from Last 1 Encounters:  07/31/19 5\' 11"  (1.803 m)     Weight:   Wt Readings from Last 1 Encounters:  09/17/19 65.6 kg    Ideal Body Weight:  78.2 kg  BMI:  Body mass index is 20.17 kg/m.  Estimated Nutritional Needs:   Kcal:  2300-2500 kcal  Protein:  115-130 grams  Fluid:  1000 ml + UOP   Mariana Single RD, LDN Clinical Nutrition Pager # [phone]

## 2019-09-18 NOTE — Progress Notes (Signed)
[facility] for Infectious Disease  Date of Admission:  07/30/2019     Total days of antibiotics 5         ASSESSMENT:  Jonathan [name] has no evidence of endocarditis on his TEE completed today.  There does not appear to be any further evidence of infectious process and risk of infective endocarditis are low per Dukes criteria.  Recommend to continue monitoring off antibiotics at this time.  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   PLAN:  1. No evidence of infectious endocarditis 2. Monitor off antibiotics.  Principal Problem:   FUO (fever of unknown origin) Active Problems:   Chronic hepatitis C without hepatic coma ([name])   Diabetes mellitus type 2, uncomplicated ([name])   History of coronary artery disease   Hypertension   ESRD (end stage renal disease) on dialysis ([name])   Acute respiratory failure due to COVID-19 [facility])   Acute respiratory failure with hypoxia ([name])   Cerebral thrombosis with cerebral infarction   Cerebral embolism with cerebral infarction   .  stroke: mapping our early stages of recovery book   Does not apply Once  . allopurinol  100 mg Oral Daily  . aspirin  81 mg Oral Daily  . atorvastatin  40 mg Oral Daily  . Chlorhexidine Gluconate Cloth  6 each Topical Q0600  . darbepoetin (ARANESP) injection - DIALYSIS  150 mcg Intravenous Q Tue-HD  . feeding supplement (NEPRO CARB STEADY)  237 mL Oral BID BM  . feeding supplement (PRO-STAT SUGAR FREE 64)  30 mL Oral BID  . insulin aspart  0-15 Units Subcutaneous TID WC  . insulin aspart  0-5 Units Subcutaneous QHS  . insulin aspart  3 Units Subcutaneous TID WC  . metoCLOPramide  5 mg Oral BID WC  . multivitamin  1 tablet Oral QHS  . pantoprazole  40 mg Oral Daily  . predniSONE  5 mg Oral Q breakfast  . sodium chloride flush  3 mL Intravenous Q12H  . Warfarin - Pharmacist Dosing Inpatient   Does not apply q1800    SUBJECTIVE:  Afebrile overnight with no acute complaints.  TEE completed  today with no evidence of endocarditis and ejection fraction of 30 to 35% with preserved valve function.   No Known Allergies   Review of Systems: Review of Systems  Constitutional: Negative for chills, fever and weight loss.  Respiratory: Negative for cough, shortness of breath and wheezing.   Cardiovascular: Negative for chest pain and leg swelling.  Gastrointestinal: Negative for abdominal pain, constipation, diarrhea, nausea and vomiting.  Skin: Negative for rash.      OBJECTIVE: Vitals:   09/18/19 0958 09/18/19 1000 09/18/19 1010 09/18/19 1052  BP: 121/65  132/71 136/74  Pulse: (!) 104 (!) 105 (!) 105 (!) 103  Resp: (!) 27 (!) 28 (!) 21 20  Temp: 97.8 F (36.6 C)   98.1 F (36.7 C)  TempSrc: Oral   Oral  SpO2: 97% 97% 96% 94%  Weight:      Height:       Body mass index is 20.17 kg/m.  Physical Exam Constitutional:      General: He is not in acute distress.    Appearance: He is well-developed.  Cardiovascular:     Rate and Rhythm: Normal rate and regular rhythm.     Heart sounds: Normal heart sounds.  Pulmonary:     Effort: Pulmonary effort is normal.     Breath sounds: Normal breath sounds.  Skin:    General: Skin is warm and dry.  Neurological:     Mental Status: He is alert and oriented to person, place, and time.  Psychiatric:        Behavior: Behavior normal.        Thought Content: Thought content normal.        Judgment: Judgment normal.     Lab Results Lab Results  Component Value Date   WBC 6.7 09/17/2019   HGB 9.5 (L) 09/17/2019   HCT 30.9 (L) 09/17/2019   MCV 102.7 (H) 09/17/2019   PLT 276 09/17/2019    Lab Results  Component Value Date   CREATININE 5.97 (H) 09/18/2019   BUN 32 (H) 09/18/2019   NA 137 09/18/2019   K 4.0 09/18/2019   CL 96 (L) 09/18/2019   CO2 28 09/18/2019    Lab Results  Component Value Date   ALT 26 09/15/2019   AST 20 09/15/2019   ALKPHOS 65 09/15/2019   BILITOT 0.4 09/15/2019     Microbiology: Recent  Results (from the past 240 hour(s))  Culture, blood (single)     Status: None (Preliminary result)   Collection Time: 09/15/19 10:16 AM   Specimen: BLOOD LEFT HAND  Result Value Ref Range Status   Specimen Description BLOOD LEFT HAND  Final   Special Requests   Final    AEROBIC BOTTLE ONLY Blood Culture results may not be optimal due to an inadequate volume of blood received in culture bottles   Culture   Final    NO GROWTH 3 DAYS Performed at [facility], [place] [address], [place], [place] [address]    Report Status PENDING  Incomplete  SARS CORONAVIRUS 2 (TAT 6-24 HRS) Nasopharyngeal Nasopharyngeal Swab     Status: None   Collection Time: 09/16/19  1:55 PM   Specimen: Nasopharyngeal Swab  Result Value Ref Range Status   SARS Coronavirus 2 NEGATIVE NEGATIVE Final    Comment: (NOTE) SARS-CoV-2 target nucleic acids are NOT DETECTED. The SARS-CoV-2 RNA is generally detectable in upper and lower respiratory specimens during the acute phase of infection. Negative results do not preclude SARS-CoV-2 infection, do not rule out co-infections with other pathogens, and should not be used as the sole basis for treatment or other patient management decisions. Negative results must be combined with clinical observations, patient history, and epidemiological information. The expected result is Negative. Fact Sheet for Patients: SugarRoll.be Fact Sheet for Healthcare Providers: https://www.woods-mathews.com/ This test is not yet approved or cleared by the [place] FDA and  has been authorized for detection and/or diagnosis of SARS-CoV-2 by FDA under an Emergency Use Authorization (EUA). This EUA will remain  in effect (meaning this test can be used) for the duration of the COVID-19 declaration under Section 56 4(b)(1) of the Act, 21 U.S.C. section 360bbb-3(b)(1), unless the authorization is terminated or revoked sooner. Performed at [facility], [place] [address] [place], [place] [address]      Terri Piedra, [facility] [phone] Pager  09/18/2019  10:58 AM

## 2019-09-18 NOTE — Plan of Care (Signed)
  Problem: Coping: Goal: Psychosocial and spiritual needs will be supported Outcome: Progressing   Problem: Self-Care: Goal: Ability to participate in self-care as condition permits will improve Outcome: Progressing

## 2019-09-18 NOTE — Progress Notes (Signed)
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  For endocarditis  Per ID monitor off antibiotics   Objective Vital signs in last 24 hours: Vitals:   09/18/19 1000 09/18/19 1010 09/18/19 1052 09/18/19 1245  BP:  132/71 136/74 132/78  Pulse: (!) 105 (!) 105 (!) 103 99  Resp: (!) 28 (!) 21 20 18   Temp:   98.1 F (36.7 C) 97.8 F (36.6 C)  TempSrc:   Oral Oral  SpO2: 97% 96% 94% 100%  Weight:      Height:       Weight change:   Physical Exam General:Alert ,chronically ill appearing male in NAD Heart:RRR, no m,r,g Lungs:CTAB, nml WOB Abdomen:soft, NT ND Extremities:no LE edema Dialysis Access:RU AVF pos bruit  Dialysis Orders: TTSG/O -[facility] when COVID 19 negative 4 hr 15 min 180NRE 400/800 80.5 kg 2.0 K/ 2.0 Ca R AVF -Heparin 8000 units IV TIW -Mircera 75 mcg IV q 2 weeks (last dose 07/25/19) -Venofer 50 mg IV weekly -Parsabiv 7.5 mg IV TIW   Problem/Plan  1.Acute COVID-19 pneumonia - s/p remdesivir &Decadron. Per primary. Now out of 21 day isolation window but still required to have 2 negative COVID 19 test before he can return to [facility]- last + 1/14. Not retested since. Pt can dialyze at [facility] until COVID tests negative. 2. 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 K 4 .0.TDC removed1/26.Marland Kitchenif time needs to be shortened - it shouldn't be <3.5 3. Anemiaof CKD-Hgb10.8.>9.8>.9.5ContinueAranesp to 144mcg qwk. Transfuse prn.tsat 34%. 4. Secondary hyperparathyroidism-Ca/P ok. No binders or VDRA. No parsabiv because not on formulary.  5.HTN/volume-Does not appear volume overloaded. Blood pressure well controlled.Well below EDW, will need to lower at d/c 6. Nutrition- Albumin low2.2>2.5. Dys 3 diet Continue renal vit/prostat. And ensure change to NePro With some K^ with ensure  7.LV thrombus-coumadin. 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 Per primary 9.Disposition: SNF placement when bed available- having placement difficulties.   Ernest Haber, PA-C [facility] [phone] 09/18/2019,1:25 PM  LOS: 50 days   Labs: Basic Metabolic Panel: Recent Labs  Lab 09/15/19 0713 09/17/19 0530 09/18/19 0518  NA 136 131* 137  K 4.3 5.0 4.0  CL 96* 92* 96*  CO2 26 25 28   GLUCOSE 99 223* 91  BUN 21 55* 32*  CREATININE 5.12* 7.87* 5.97*  CALCIUM 8.3* 8.4* 8.6*  PHOS 4.7* 3.3 3.7   Liver Function Tests: Recent Labs  Lab 09/15/19 0713 09/15/19 0741 09/17/19 0530 09/18/19 0518  AST  --  20  --   --   ALT  --  26  --   --   ALKPHOS  --  65  --   --   BILITOT  --  0.4  --   --   PROT  --  6.9  --   --   ALBUMIN 2.2*  --  2.2* 2.5*   No results for input(s): LIPASE, AMYLASE in the last 168 hours. No results for input(s): AMMONIA in the last 168 hours. CBC: Recent Labs  Lab 09/12/19 0805 09/12/19 0805 09/14/19 1415 09/14/19 1415 09/15/19 0713 09/17/19 0530 09/17/19 1239  WBC 10.0   < > 6.5   < > DUPLICATE REQUEST  8.0 7.3 6.7  NEUTROABS 7.7  --   --   --  5.4  --   --   HGB 9.7*   < >  9.2*   < > DUPLICATE REQUEST  [id number]* 9.8* 9.5*  HCT 30.9*   < > 0000000*   < > DUPLICATE REQUEST  [id number]* 31.1* 30.9*  MCV 102.7*  --  0000000*  --  DUPLICATE REQUEST  [id number]* 101.6* 102.7*  PLT 262   < > 123456   < > DUPLICATE REQUEST  [id number]   < > = values in this interval not displayed.   Cardiac Enzymes: No results for input(s): CKTOTAL, CKMB, CKMBINDEX, TROPONINI in the last 168 hours. CBG: Recent Labs  Lab 09/17/19 2209 09/18/19 0001 09/18/19 0453 09/18/19 0732 09/18/19 1114  GLUCAP 56* 82 90 77 146*    Studies/Results: CT ABDOMEN PELVIS WO CONTRAST  Result Date: 09/17/2019 CLINICAL DATA:  69 year old male with abdominal pain and fever. History of COVID-19. EXAM: CT CHEST, ABDOMEN AND PELVIS WITHOUT CONTRAST TECHNIQUE: Multidetector CT imaging of the chest,  abdomen and pelvis was performed following the standard protocol without IV contrast. COMPARISON:  Chest radiograph dated 09/15/2019. FINDINGS: Evaluation of this exam is limited in the absence of intravenous contrast. CT CHEST FINDINGS Cardiovascular: There is no cardiomegaly. There is a high attenuating pericardial effusion adjacent to the left ventricle measuring approximately 13 mm in thickness. This may represent hemorrhagic fluid. Clinical correlation and further evaluation with echocardiogram recommended. 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 Three vessel coronary vascular calcification noted. There is atherosclerotic calcification of thoracic aorta. The central pulmonary arteries are grossly unremarkable. Mediastinum/Nodes: No hilar or mediastinal adenopathy. The esophagus and the thyroid gland are grossly unremarkable. No mediastinal fluid collection. 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 Bilateral hazy and streaky densities most concerning for atypical infection and in keeping with history of COVID-19. Clinical correlation is recommended. No lobar consolidation, pleural effusion, or pneumothorax. The central airways are patent. Musculoskeletal: No chest wall mass or suspicious bone lesions identified. CT ABDOMEN PELVIS FINDINGS No intra-abdominal free air or free fluid. Hepatobiliary: The liver is unremarkable. No intrahepatic biliary ductal dilatation. There are multiple small stones in the gallbladder. No pericholecystic fluid or evidence of acute cholecystitis by CT. Ultrasound may provide better evaluation. Pancreas: Unremarkable. No pancreatic ductal dilatation or surrounding inflammatory changes. Spleen: Normal in size without focal abnormality. Adrenals/Urinary Tract: The adrenal glands are unremarkable. Moderate bilateral renal parenchyma atrophy. There is no hydronephrosis or nephrolithiasis on either side. Small bilateral renal hypodense  lesions are suboptimally characterized on this noncontrast CT. The visualized ureters appear unremarkable. The urinary bladder is minimally distended. There is apparent diffuse thickening of the bladder wall which may be partly related to underdistention. Cystitis is not excluded. Correlation with urinalysis recommended. 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 There is no hydronephrosis or nephrolithiasis of the transplant kidney. No peritransplant fluid collection identified. The transplant kidney however appears slightly small. Stomach/Bowel: Moderate amount of stool noted throughout the colon. Small scattered colonic diverticula without active inflammatory changes noted. There is no bowel obstruction or active inflammation. Normal appendix. Vascular/Lymphatic: Advanced aortoiliac atherosclerotic disease. The IVC is unremarkable. No portal venous gas. There is no adenopathy. Reproductive: The prostate and seminal vesicles are grossly unremarkable. Other: There is a left psoas intramuscular hematoma measuring 5.8 x 5.0 cm in greatest axial dimensions and approximately 10 cm in craniocaudal length. Musculoskeletal: Degenerative changes of the spine primarily at L4-L5. No acute osseous pathology. IMPRESSION: 1. Left psoas intramuscular hematoma. 2. Multifocal pneumonia, likely viral etiology including COVID-19. Clinical correlation is recommended. 3. 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 Cardiology consult and further evaluation with echocardiogram recommended. 4. Coronary vascular calcification and Aortic Atherosclerosis (ICD10-I70.0). 5. Cholelithiasis. 6. No hydronephrosis or nephrolithiasis. Right lower quadrant transplant kidney appears slightly atrophic. 7. Mildly thickened bladder wall. Correlation with urinalysis recommended to exclude UTI. These results were called by telephone at the time of interpretation on 09/17/2019 at 9:39  pm to patient's nurse Ruthann Cancer, who verbally acknowledged these results. Electronically Signed   By: Anner Crete M.D.   On: 09/17/2019 21:50   CT CHEST WO CONTRAST  Result Date: 09/17/2019 CLINICAL DATA:  69 year old male with abdominal pain and fever. History of COVID-19. EXAM: CT CHEST, ABDOMEN AND PELVIS WITHOUT CONTRAST TECHNIQUE: Multidetector CT imaging of the chest, abdomen and pelvis was performed following the standard protocol without IV contrast. COMPARISON:  Chest radiograph dated 09/15/2019. FINDINGS: Evaluation of this exam is limited in the absence of intravenous contrast. CT CHEST FINDINGS Cardiovascular: There is no cardiomegaly. There is a high attenuating pericardial effusion adjacent to the left ventricle measuring approximately 13 mm in thickness. This may represent hemorrhagic fluid. Clinical correlation and further evaluation with echocardiogram recommended. 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 Three vessel coronary vascular calcification noted. There is atherosclerotic calcification of thoracic aorta. The central pulmonary arteries are grossly unremarkable. Mediastinum/Nodes: No hilar or mediastinal adenopathy. The esophagus and the thyroid gland are grossly unremarkable. No mediastinal fluid collection. 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 Bilateral hazy and streaky densities most concerning for atypical infection and in keeping with history of COVID-19. Clinical correlation is recommended. No lobar consolidation, pleural effusion, or pneumothorax. The central airways are patent. Musculoskeletal: No chest wall mass or suspicious bone lesions identified. CT ABDOMEN PELVIS FINDINGS No intra-abdominal free air or free fluid. Hepatobiliary: The liver is unremarkable. No intrahepatic biliary ductal dilatation. There are multiple small stones in the gallbladder. No pericholecystic fluid or evidence of acute cholecystitis by CT.  Ultrasound may provide better evaluation. Pancreas: Unremarkable. No pancreatic ductal dilatation or surrounding inflammatory changes. Spleen: Normal in size without focal abnormality. Adrenals/Urinary Tract: The adrenal glands are unremarkable. Moderate bilateral renal parenchyma atrophy. There is no hydronephrosis or nephrolithiasis on either side. Small bilateral renal hypodense lesions are suboptimally characterized on this noncontrast CT. The visualized ureters appear unremarkable. The urinary bladder is minimally distended. There is apparent diffuse thickening of the bladder wall which may be partly related to underdistention. Cystitis is not excluded. Correlation with urinalysis recommended. 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 There is no hydronephrosis or nephrolithiasis of the transplant kidney. No peritransplant fluid collection identified. The transplant kidney however appears slightly small. Stomach/Bowel: Moderate amount of stool noted throughout the colon. Small scattered colonic diverticula without active inflammatory changes noted. There is no bowel obstruction or active inflammation. Normal appendix. Vascular/Lymphatic: Advanced aortoiliac atherosclerotic disease. The IVC is unremarkable. No portal venous gas. There is no adenopathy. Reproductive: The prostate and seminal vesicles are grossly unremarkable. Other: There is a left psoas intramuscular hematoma measuring 5.8 x 5.0 cm in greatest axial dimensions and approximately 10 cm in craniocaudal length. Musculoskeletal: Degenerative changes of the spine primarily at L4-L5. No acute osseous pathology. IMPRESSION: 1. Left psoas intramuscular hematoma. 2. Multifocal pneumonia, likely viral etiology including COVID-19. Clinical correlation is recommended. 3. 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 Cardiology consult and further evaluation with echocardiogram  recommended. 4. Coronary vascular calcification and  Aortic Atherosclerosis (ICD10-I70.0). 5. Cholelithiasis. 6. No hydronephrosis or nephrolithiasis. Right lower quadrant transplant kidney appears slightly atrophic. 7. Mildly thickened bladder wall. Correlation with urinalysis recommended to exclude UTI. These results were called by telephone at the time of interpretation on 09/17/2019 at 9:39 pm to patient's nurse Ruthann Cancer, who verbally acknowledged these results. Electronically Signed   By: Anner Crete M.D.   On: 09/17/2019 21:50   Medications: . sodium chloride 10 mL/hr at 09/14/19 1008   .  stroke: mapping our early stages of recovery book   Does not apply Once  . allopurinol  100 mg Oral Daily  . aspirin  81 mg Oral Daily  . atorvastatin  40 mg Oral Daily  . Chlorhexidine Gluconate Cloth  6 each Topical Q0600  . darbepoetin (ARANESP) injection - DIALYSIS  150 mcg Intravenous Q Tue-HD  . feeding supplement (NEPRO CARB STEADY)  237 mL Oral TID BM  . feeding supplement (PRO-STAT SUGAR FREE 64)  30 mL Oral BID  . insulin aspart  0-15 Units Subcutaneous TID WC  . insulin aspart  0-5 Units Subcutaneous QHS  . insulin aspart  3 Units Subcutaneous TID WC  . metoCLOPramide  5 mg Oral BID WC  . multivitamin  1 tablet Oral QHS  . pantoprazole  40 mg Oral Daily  . predniSONE  5 mg Oral Q breakfast  . sodium chloride flush  3 mL Intravenous Q12H  . Warfarin - Pharmacist Dosing Inpatient   Does not apply [phone]

## 2019-09-18 NOTE — Progress Notes (Signed)
    [facility] has been requested to perform a transesophageal echocardiogram on 09/18/19 for bacteremia.  After careful review of history and examination, the risks and benefits of transesophageal echocardiogram have been explained to the patient's wife, Anatoli Nola, including risks of esophageal damage, perforation (1:10,000 risk), bleeding, pharyngeal hematoma as well as other potential complications associated with conscious sedation including aspiration, arrhythmia, respiratory failure and death. Alternatives to treatment were discussed, questions were answered. Wife is willing to proceed.   TEE scheduled for 09/18/19 at 9:30am with Dr. Harrell Gave.  Roby Lofts, PA-C 09/18/2019 7:58 AM

## 2019-09-18 NOTE — Interval H&P Note (Signed)
History and Physical Interval Note:  09/18/2019 9:09 AM  Jonathan Alexandria Sr.  has presented today for surgery, with the diagnosis of BACTEREMIA. And CVA  The various methods of treatment have been discussed with the patient and family. After consideration of risks, benefits and other options for treatment, the patient has consented to  Procedure(s): TRANSESOPHAGEAL ECHOCARDIOGRAM (TEE) (N/A) as a surgical intervention.  The patient's history has been reviewed, patient examined, no change in status, stable for surgery.  I have reviewed the patient's chart and labs.  Questions were answered to the patient's satisfaction.     Serra Younan Harrell Gave

## 2019-09-18 NOTE — CV Procedure (Signed)
    TRANSESOPHAGEAL ECHOCARDIOGRAM   NAME:  Jonathan Alexandria Sr.   MRN: [id number] DOB:  1951/06/09   ADMIT DATE: 07/30/2019  INDICATIONS: CVA and persistent fevers  PROCEDURE:   Informed consent was obtained prior to the procedure. The risks, benefits and alternatives for the procedure were discussed and the patient comprehended these risks.  Risks include, but are not limited to, cough, sore throat, vomiting, nausea, somnolence, esophageal and stomach trauma or perforation, bleeding, low blood pressure, aspiration, pneumonia, infection, trauma to the teeth and death.    Procedural time out performed. Anesthesia was monitored under the supervision of Dr. Christella Hartigan. The patient received propofol for anesthesia.  The transesophageal probe was inserted in the esophagus and stomach without difficulty and multiple views were obtained.    COMPLICATIONS:    There were no immediate complications.  FINDINGS:  LEFT VENTRICLE: EF = 30-35%. At least moderate LVH. Apical hypokinesis with aneurysm  RIGHT VENTRICLE: Normal size and function.   LEFT ATRIUM: No thrombus/mass.  LEFT ATRIAL APPENDAGE: No thrombus/mass.   RIGHT ATRIUM: No thrombus/mass.  AORTIC VALVE:  Trileaflet. Trivial regurgitation. No vegetation.  MITRAL VALVE:    Normal structure. Mild regurgitation. No vegetation.  TRICUSPID VALVE: Normal structure. Trivial regurgitation. No vegetation.  PULMONIC VALVE: Grossly normal structure. Trivial regurgitation. No apparent vegetation.  INTERATRIAL SEPTUM: No PFO or ASD seen by color Doppler. Negative bubble study for right to left shunt.  PERICARDIUM: No effusion noted.  DESCENDING AORTA: Mild diffuse plaque seen   CONCLUSION: no cardiac source of embolism   Buford Dresser, MD, PhD [facility]  [address] [place], [place] [address] [phone]   9:55 AM

## 2019-09-18 NOTE — Anesthesia Preprocedure Evaluation (Signed)
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   Pulmonary exam normal        Cardiovascular hypertension, + Past MI  Normal cardiovascular exam     Neuro/Psych CVA negative psych ROS   GI/Hepatic negative GI ROS, (+) Hepatitis -, C  Endo/Other  diabetes  Renal/GU ESRFRenal disease (prior renal transplant)  negative genitourinary   Musculoskeletal negative musculoskeletal ROS (+)   Abdominal   Peds  Hematology  (+) anemia ,   Anesthesia Other Findings Bacteremia- eval for LV thrombus  COVID-19 infection Dec 2020  Reproductive/Obstetrics                             Anesthesia Physical Anesthesia Plan  ASA: IV  Anesthesia Plan: MAC   Post-op Pain Management:    Induction: Intravenous  PONV Risk Score and Plan: 1 and Propofol infusion, TIVA and Treatment may vary due to age or medical condition  Airway Management Planned: Natural Airway, Nasal Cannula and Simple Face Mask  Additional Equipment: None  Intra-op Plan:   Post-operative Plan:   Informed Consent: I have reviewed the patients History and Physical, chart, labs and discussed the procedure including the risks, benefits and alternatives for the proposed anesthesia with the patient or authorized representative who has indicated his/her understanding and acceptance.       Plan Discussed with:   Anesthesia Plan Comments:         Anesthesia Quick Evaluation

## 2019-09-18 NOTE — Progress Notes (Signed)
ANTICOAGULATION CONSULT NOTE - Follow Up Consult  Pharmacy Consult for Coumadin Indication: LV thrombus, CVA  No Known Allergies  Patient Measurements: Height: 5\' 11"  (180.3 cm) Weight: 144 lb 10 oz (65.6 kg) IBW/kg (Calculated) : 75.3  Vital Signs: Temp: 97.8 F (36.6 C) (02/10 1245) Temp Source: Oral (02/10 1245) BP: 132/78 (02/10 1245) Pulse Rate: 99 (02/10 1245)  Labs: Recent Labs    09/17/19 0530 09/17/19 0530 09/17/19 1239 09/18/19 0518 09/18/19 1045  HGB 9.8*   < > 9.5*  --  10.7*  HCT 31.1*  --  30.9*  --  35.2*  PLT 277  --  276  --  289  LABPROT 25.0*  --   --  23.6*  --   INR 2.3*  --   --  2.1*  --   CREATININE 7.87*  --   --  5.97*  --    < > = values in this interval not displayed.    Estimated Creatinine Clearance: 11.3 mL/min (A) (by C-G formula based on SCr of 5.97 mg/dL (H)).   Assessment: 44 yoM previously on apixaban for LV thrombus, changed to warfarin this admission. Pt noted to have recent stroke on MRI 12/23.   INR has been labile, with warfarin held x2 doses on 2/2-2/3 for high INR, then had low INR of 1.8 on 2/7. INR remains therapeutic today at 2.1. CBC stable.  CT abdomen pelvis from 2/9 shows L psoas IM hematoma and possible hemopericardium. ID ordered MRI L spine to better characterize hematoma and ensure not an abscess. Per discussion with Dr. Alfredia Ferguson, ok to continue warfarin for now.  Goal of Therapy:  INR 2-3 Monitor platelets by anticoagulation protocol: Yes   Plan:  Warfarin 6mg  PO x 1 dose tonight Monitor daily INR, CBC, s/sx bleeding   Elicia Lamp, PharmD, BCPS Please check AMION for all [facility] contact numbers Clinical Pharmacist 09/18/2019 2:49 PM

## 2019-09-19 LAB — RENAL FUNCTION PANEL
Albumin: 2.4 g/dL — ABNORMAL LOW (ref 3.5–5.0)
Anion gap: 19 — ABNORMAL HIGH (ref 5–15)
BUN: 47 mg/dL — ABNORMAL HIGH (ref 8–23)
CO2: 25 mmol/L (ref 22–32)
Calcium: 8.9 mg/dL (ref 8.9–10.3)
Chloride: 91 mmol/L — ABNORMAL LOW (ref 98–111)
Creatinine, Ser: 7.63 mg/dL — ABNORMAL HIGH (ref 0.61–1.24)
GFR calc Af Amer: 8 mL/min — ABNORMAL LOW (ref >60)
GFR calc non Af Amer: 7 mL/min — ABNORMAL LOW (ref >60)
Glucose, Bld: 175 mg/dL — ABNORMAL HIGH (ref 70–99)
Phosphorus: 3.7 mg/dL (ref 2.5–4.6)
Potassium: 4.5 mmol/L (ref 3.5–5.1)
Sodium: 135 mmol/L (ref 135–145)

## 2019-09-19 LAB — CBC
HCT: 33.5 % — ABNORMAL LOW (ref 39.0–52.0)
Hemoglobin: 10.1 g/dL — ABNORMAL LOW (ref 13.0–17.0)
MCH: 30.9 pg (ref 26.0–34.0)
MCHC: 30.1 g/dL (ref 30.0–36.0)
MCV: 102.4 fL — ABNORMAL HIGH (ref 80.0–100.0)
Platelets: 278 10*3/uL (ref 150–400)
RBC: 3.27 MIL/uL — ABNORMAL LOW (ref 4.22–5.81)
RDW: 17.7 % — ABNORMAL HIGH (ref 11.5–15.5)
WBC: 9.1 10*3/uL (ref 4.0–10.5)
nRBC: 0 % (ref 0.0–0.2)

## 2019-09-19 LAB — PROTIME-INR
INR: 2.4 — ABNORMAL HIGH (ref 0.8–1.2)
Prothrombin Time: 26.2 seconds — ABNORMAL HIGH (ref 11.4–15.2)

## 2019-09-19 LAB — GLUCOSE, CAPILLARY
Glucose-Capillary: 158 mg/dL — ABNORMAL HIGH (ref 70–99)
Glucose-Capillary: 193 mg/dL — ABNORMAL HIGH (ref 70–99)
Glucose-Capillary: 253 mg/dL — ABNORMAL HIGH (ref 70–99)

## 2019-09-19 LAB — SARS CORONAVIRUS 2 (TAT 6-24 HRS): SARS Coronavirus 2: NEGATIVE

## 2019-09-19 MED ORDER — WARFARIN SODIUM 4 MG PO TABS
4.0000 mg | ORAL_TABLET | Freq: Once | ORAL | Status: AC
  Administered 2019-09-19: 4 mg via ORAL
  Filled 2019-09-19: qty 1

## 2019-09-19 NOTE — Progress Notes (Signed)
PROGRESS NOTE    Jonathan Pedder Sr.  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 MRI brain showed small infarct in the left pons left occiput. Neurology saw the patient and felt this was secondary to left ventricular thrombus ( 2D echo did not show any clots or PFO); he was started on heparin. He was diagnosed with Covid infection on 07/24/2019 and was treated for Infection at that time. Patient has recurrently tested positive -probably positivity from his index infection in December.Hospital course complicated by fever spikes on 1/30, 2/1,2/3 , 2/5 and 2/6 (102.63F).  Infectious diseases evaluated and ordered MRI and recommending monitoring off of antibiotics.  Hospitalization is further been complicated by left psoas hematoma.  TEE was done today and showed no evidence of any cardiac embolism with no evidence of any clot.  Orthopedics evaluated his hematoma and recommending no current intervention. PT OT recommending SNF.   Assessment & Plan:   Principal Problem:   FUO (fever of unknown origin) Active Problems:   Chronic hepatitis C without hepatic coma ([name])   Diabetes mellitus type 2, uncomplicated ([name])   History of coronary artery disease   Hypertension   ESRD (end stage renal disease) on dialysis ([name])   Acute respiratory failure due to COVID-19 [facility])   Acute respiratory failure with hypoxia ([name])   Cerebral thrombosis with cerebral infarction   Cerebral embolism with cerebral infarction  Acute left pons and left occipital strokes -Etiology unclear but though to be secondary to Covid induced hypercoaguablilty versus LV thrombus. -Neurology saw the patient and felt  this was secondary to left ventricular thrombus;  -He was started on heparin which had been changed to Eliquis and now Coumadin -Continue ASA, atorvastatin 40 mg p.o. daily, Eliquis changed to coumadin;  -Coumadin per Pharmacy Dosing -SNF placement recommended by PT and currently there is no facility for the patient to go to his current dialysis needs and being Covid positive previously  Fevers, improved -Secondary to aspiration vs acute infections but now improved  -.Urine culture NGTD. However, patient received Rocephin x 5 daysfor presumed UTI/aspiration during [facility].  -Blood cultures 1/30, 2/7 after fevers, negative to date. 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  -HD catheter removed 1/26 and site looks okay with no redness but has mild draiange.  -Given transplant h/o and concern for recurrent fevers, consulted ID.  -Consult speech to r/o ongoing aspiration.  -CT chest /abd/pelvis ordered (changed to WO contrast as radiology woundnt give contrast if patient been on dialysis for <8months) per d/w Dr Tommy Medal and showed "Left psoas intramuscular hematoma. Multifocal pneumonia, likely viral etiology including COVID-19. Clinical correlation is recommended. 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 Cardiology consult and further evaluation with echocardiogram recommended. Coronary vascular calcification and Aortic Atherosclerosis. Cholelithiasis. No hydronephrosis or nephrolithiasis. Right lower quadrant transplant kidney appears slightly atrophic. Mildly thickened bladder wall. Correlation with urinalysis recommended to exclude UTI." -MRI ordered due concern for Abscesses and showed "Large left psoas hematoma, similar in size compared to prior CT. No  evidence of  discitis/osteomyelitis. Degenerative disc disease at L3-4 and L4-5 as described above, worst at L4-5 where there is mild spinal canal stenosis, mild right and mild to moderate left neural foraminal narrowing." -Infectious disease evaluated and recommends watching off of antibiotics for now   Large Left Psoas Hematoma -Continue with anticoagulation this point given his left ventricular thrombus and need for anticoagulation -hemoglobin/hematocrit remains relatively stable -Discussed with Dr. Percell Miller of orthopedics who will formally see the patient in the morning and recommends currently no indication for drainage at this point they currently would not recommend any intervention at this time given that is unlikely to be infected given his lack of symptomatology   Recent COVID-19 pneumonia -Initially tested positive in December 2020 and completed appropriate treatment.  -No longer infectious and off of precautions at this point.  -Assumed that patient could continue to test positive for 90 days from his initial positive test.  -SNF wants 2 Negative Tests prior to D/C -SpO2: 94 % O2 Flow Rate (L/min): 2 L/min  Recent Labs    09/18/19 1418  CRP 5.5*    Lab Results  Component Value Date   SARSCOV2NAA NEGATIVE 09/19/2019   SARSCOV2NAA NEGATIVE 09/16/2019   SARSCOV2NAA POSITIVE (A) 08/22/2019   SARSCOV2NAA POSITIVE (A) 07/24/2019  -Continue monitor chest x-ray intermittently but currently is improved from a respiratory standpoint -Continue Xopenex 2 puffs elations every 8 hours as needed for wheezing  ?LV thrombus -Not seen on echo, not sure if TEE deferred due to [place].  -Coumadin per pharmacy -TEE done and showed no hemipericardium and there is no cardiac source of embolism noted  ESRD/HD TTSa -Nephrology following. Per renal patient can dialyze at [facility] until COVID tests negative (Tested Negative on 09/16/19 and again yesterday 09/18/2019) -Patient's BUN/creatinine  went from 55/7.87 -> 32/5.97 -> 47/7.63 -Continue with renal/carb modified diet with 1200 mL fluid restriction -Nephrology continuing darbepoetin alpha 150 mcg every Tuesday for hemodialysis -Continue with Rena-Vit -Patient to dialyze tomorrow  Prior cadaveric renal transplant -Continue prednisone. Fever w/u as above and his fevers have improved   Hyperglycemia, Pre-diabetes -Continue moderate NovoLog sliding scale insulin before meals and at bedtime -CBGs ranging from 158-287   HTN -Currently his losartan 25 g p.o. daily has been held -He is on metoprolol succinate 50 mg p.o. daily but this was held and will resume -Change to monitor blood pressures very carefully and Last BP was 119/68  Macrocytic Anemia -Patient's Hb/Hct is now 10.1/33.5 and down form 10.7/34.2 -MCV was 102.4 -Continue to Monitor for S/Sx of Bleeding; Currently no Active bleeding noted -Repeat CBC in AM   DVT prophylaxis: Anticoagulated with  Coumadin and now INR Therapeutic  Code Status: FULL CODE Family Communication: No family present at bedside  Disposition Plan: Patient presented from home and currently requires SNF; He has stopped Spiking temperatures and has Tested Negative for COVID x2. Discharge barriers due to finding SNF placement that can transport patient to Dialysis   Consultants:   Cardiology  Infectious Diseases   Nephrology   Orthopedic Surgery   Procedures:  ECHOCARDIOGRAM 08/01/2019 IMPRESSIONS    1. Left ventricular ejection fraction, by visual estimation, is 30 to  35%. The left ventricle has moderate to severely decreased function. There  is no left ventricular hypertrophy.  2. Severe dyskinesis of the left ventricular, entire apical segment.  3. Apical left ventricular aneurysm.  4. Definity contrast agent was given IV to delineate the left ventricular  endocardial borders.  5. No intracardiac thrombi or masses  were visualized.  6. Left ventricular diastolic  parameters are consistent with Grade I  diastolic dysfunction (impaired relaxation).  7. Global right ventricle has normal systolic function.The right  ventricular size is normal. No increase in right ventricular wall  thickness.  8. Left atrial size was normal.  9. Right atrial size was normal.  10. The mitral valve is normal in structure. Mild mitral valve  regurgitation. No evidence of mitral stenosis.  11. The tricuspid valve is normal in structure.  12. The aortic valve is normal in structure. Aortic valve regurgitation is  trivial. No evidence of aortic valve sclerosis or stenosis.  13. The pulmonic valve was normal in structure. Pulmonic valve  regurgitation is not visualized.  14. The inferior vena cava is normal in size with greater than 50%  respiratory variability, suggesting right atrial pressure of 3 mmHg.   FINDINGS  Left Ventricle: Left ventricular ejection fraction, by visual estimation,  is 30 to 35%. The left ventricle has moderate to severely decreased  function. Severe dyskinesis of the left ventricular, entire apical  segment. Definity contrast agent was given  IV to delineate the left ventricular endocardial borders. The left  ventricle demonstrates regional wall motion abnormalities. There is no  left ventricular hypertrophy. Left ventricular diastolic parameters are  consistent with Grade I diastolic  dysfunction (impaired relaxation). Normal left atrial pressure. Apical  aneurysm can be seen.   Right Ventricle: The right ventricular size is normal. No increase in  right ventricular wall thickness. Global RV systolic function is has  normal systolic function.   Left Atrium: Left atrial size was normal in size.   Right Atrium: Right atrial size was normal in size   Pericardium: There is no evidence of pericardial effusion.   Mitral Valve: The mitral valve is normal in structure. Mild mitral valve  regurgitation. No evidence of mitral valve stenosis  by observation.   Tricuspid Valve: The tricuspid valve is normal in structure. Tricuspid  valve regurgitation is not demonstrated.   Aortic Valve: The aortic valve is normal in structure. Aortic valve  regurgitation is trivial. The aortic valve is structurally normal, with no  evidence of sclerosis or stenosis.   Pulmonic Valve: The pulmonic valve was normal in structure. Pulmonic valve  regurgitation is not visualized. Pulmonic regurgitation is not visualized.   Aorta: The aortic root, ascending aorta and aortic arch are all  structurally normal, with no evidence of dilitation or obstruction.   Venous: The inferior vena cava is normal in size with greater than 50%  respiratory variability, suggesting right atrial pressure of 3 mmHg.   IAS/Shunts: No atrial level shunt detected by color flow Doppler. There is  no evidence of a patent foramen ovale. No ventricular septal defect is  seen or detected. There is no evidence of an atrial septal defect.   Additional Comments: No intracardiac thrombi or masses were visualized.      LV Volumes (MOD)  LV area d, A2C:  44.00 cm Diastology  LV area d, A4C:  43.50 cm LV e' lateral:  5.43 cm/s  LV area s, A2C:  34.10 cm LV E/e' lateral: 10.6  LV area s, A4C:  34.50 cm LV e' medial:  4.05 cm/s  LV major d, A2C:  9.28 cm  LV E/e' medial: 14.2  LV major d, A4C:  9.21 cm  LV major s, A2C:  8.39 cm  LV major s, A4C:  8.13 cm  LV vol d, MOD A2C: 173.0 ml  LV  vol d, MOD A4C: 169.0 ml  LV vol s, MOD A2C: 113.0 ml  LV vol s, MOD A4C: 117.0 ml  LV SV MOD A2C:   60.0 ml  LV SV MOD A4C:   169.0 ml  LV SV MOD BP:   54.4 ml   RIGHT VENTRICLE  RV S prime:   11.40 cm/s  TAPSE (M-mode): 2.0 cm   LEFT ATRIUM       Index    RIGHT ATRIUM      Index  LA Vol (A2C):  66.6 ml 33.83 ml/m RA Area:   14.30 cm  LA Vol (A4C):  55.4 ml 28.14 ml/m RA Volume:  34.50 ml 17.52 ml/m  LA Biplane Vol: 62.2 ml  31.59 ml/m  AORTIC VALVE  LVOT Vmax:  91.40 cm/s  LVOT Vmean: 58.400 cm/s  LVOT VTI:  0.221 m   MITRAL VALVE  MV Area (PHT): 3.21 cm       SHUNTS  MV PHT:    68.44 msec      Systemic VTI: 0.22 m  MV Decel Time: 236 msec  MV E velocity: 57.40 cm/s 103 cm/s  MV A velocity: 82.30 cm/s 70.3 cm/s  MV E/A ratio: 0.70    1.5    TEE LEFT VENTRICLE: EF = 30-35%. At least moderate LVH. Apical hypokinesis with aneurysm  RIGHT VENTRICLE: Normal size and function.   LEFT ATRIUM: No thrombus/mass.  LEFT ATRIAL APPENDAGE: No thrombus/mass.   RIGHT ATRIUM: No thrombus/mass.  AORTIC VALVE:  Trileaflet. Trivial regurgitation. No vegetation.  MITRAL VALVE:    Normal structure. Mild regurgitation. No vegetation.  TRICUSPID VALVE: Normal structure. Trivial regurgitation. No vegetation.  PULMONIC VALVE: Grossly normal structure. Trivial regurgitation. No apparent vegetation.  INTERATRIAL SEPTUM: No PFO or ASD seen by color Doppler. Negative bubble study for right to left shunt.  PERICARDIUM: No effusion noted.  DESCENDING AORTA: Mild diffuse plaque seen   CONCLUSION: no cardiac source of embolism   Antimicrobials:  Anti-infectives (From admission, onward)   Start     Dose/Rate Route Frequency Ordered Stop   09/11/19 0830  cefTRIAXone (ROCEPHIN) 1 g in sodium chloride 0.9 % 100 mL IVPB     1 g 200 mL/hr over 30 Minutes Intravenous Daily 09/11/19 0726 09/15/19 0959   07/31/19 1000  remdesivir 100 mg in sodium chloride 0.9 % 100 mL IVPB     100 mg 200 mL/hr over 30 Minutes Intravenous Daily 07/30/19 2217 08/04/19 0748   07/31/19 0800  ceFEPIme (MAXIPIME) 1 g in sodium chloride 0.9 % 100 mL IVPB  Status:  Discontinued     1 g 200 mL/hr over 30 Minutes Intravenous Every 24 hours 07/31/19 0748 07/31/19 0816   07/31/19 0800  vancomycin (VANCOREADY) IVPB 1500 mg/300 mL  Status:  Discontinued     1,500 mg 150 mL/hr over 120 Minutes Intravenous  Once  07/31/19 0748 07/31/19 0816   07/31/19 0748  vancomycin variable dose per unstable renal function (pharmacist dosing)  Status:  Discontinued      Does not apply See admin instructions 07/31/19 0748 07/31/19 0816   07/30/19 2230  remdesivir 200 mg in sodium chloride 0.9% 250 mL IVPB     200 mg 580 mL/hr over 30 Minutes Intravenous Once 07/30/19 2217 07/31/19 0045     Subjective: Seen and examined at bedside in Dialysis he had no complaints.  He denies any chest pain, lightheadedness or dizziness.  No nausea or vomiting.  Felt okay.  No other concerns or complaints  at this time.  Objective: Vitals:   09/19/19 0900 09/19/19 0930 09/19/19 1000 09/19/19 1028  BP: (!) 96/57 109/67 96/68 119/68  Pulse: 85 84 86 84  Resp:    18  Temp:    98.2 F (36.8 C)  TempSrc:    Oral  SpO2:    94%  Weight:    65.2 kg  Height:        Intake/Output Summary (Last 24 hours) at 09/19/2019 1530 Last data filed at 09/19/2019 1028 Gross per 24 hour  Intake 240 ml  Output 1031 ml  Net -791 ml   Filed Weights   09/18/19 2052 09/19/19 0643 09/19/19 1028  Weight: 65.6 kg 68.1 kg 65.2 kg   Examination: Physical Exam:  Constitutional: WN/WD elderly African-American male currently in no acute distress appears calm and has just finished dialysis Eyes: Lids and conjunctivae normal, sclerae anicteric  ENMT: External Ears, Nose appear normal. Grossly normal hearing. Neck: Appears normal, supple, no cervical masses, normal ROM, no appreciable thyromegaly; no JVD Respiratory: Diminished to auscultation bilaterally, no wheezing, rales, rhonchi or crackles. Normal respiratory effort and patient is not tachypenic. No accessory muscle use. Unlabored breathing Cardiovascular: RRR, no murmurs / rubs / gallops. S1 and S2 auscultated. Abdomen: Soft, non-tender, non-distended. No masses palpated. No appreciable hepatosplenomegaly. Bowel sounds positive.  GU: Deferred. Musculoskeletal: No clubbing / cyanosis of  digits/nails. No joint deformity upper and lower extremities.  Skin: No rashes, lesions, ulcers on a limited skin evalaution. No induration; Warm and dry.  Neurologic: CN 2-12 grossly intact with no focal deficits. Romberg sign and cerebellar reflexes not assessed.  Psychiatric: Normal judgment and insight. Alert and oriented x 3. Normal mood and appropriate affect.   Data Reviewed: I have personally reviewed following labs and imaging studies  CBC: Recent Labs  Lab 09/15/19 0713 09/17/19 0530 09/17/19 1239 09/18/19 1045 [id number]  WBC DUPLICATE REQUEST  8.0 7.3 6.7 9.2 9.1  NEUTROABS 5.4  --   --  5.9  --   HGB DUPLICATE REQUEST  [id number]* 9.8* 9.5* 0000000* [id number]*  HCT DUPLICATE REQUEST  [id number]* 31.1* 30.9* [id number]* [id number]*  MCV DUPLICATE REQUEST  [id number]* 101.6* 102.7* 102.9* Q000111Q*  PLT DUPLICATE REQUEST  [id number]   Basic Metabolic Panel: Recent Labs  Lab 09/14/19 0432 09/15/19 0713 09/15/19 0741 09/17/19 0530 09/18/19 0518 09/18/19 1045 09/19/19 0430  NA 135 136  --  131* 137  --  135  K 4.5 4.3  --  5.0 4.0  --  4.5  CL 99 96*  --  92* 96*  --  91*  CO2 24 26  --  25 28  --  25  GLUCOSE 149* 99  --  223* 91  --  175*  BUN 40* 21  --  55* 32*  --  47*  CREATININE 7.52* 5.12*  --  7.87* 5.97*  --  7.63*  CALCIUM 8.2* 8.3*  --  8.4* 8.6*  --  8.9  MG  --   --  1.9  --   --  2.1  --   PHOS 4.6 4.7*  --  3.3 3.7  --  3.7   GFR: Estimated Creatinine Clearance: 8.8 mL/min (A) (by C-G formula based on SCr of 7.63 mg/dL (H)). Liver Function Tests: Recent Labs  Lab 09/15/19 0713 09/15/19 0741 09/17/19 0530 09/18/19 0518 09/18/19 1045 09/19/19 0430  AST  --  20  --   --  22  --   ALT  --  26  --   --  22  --   ALKPHOS  --  65  --   --  72  --   BILITOT  --  0.4  --   --  0.7  --   PROT  --  6.9  --   --  6.8  --   ALBUMIN 2.2*  --  2.2* 2.5* 2.4* 2.4*   No results for input(s): LIPASE, AMYLASE in the last 168 hours. No results for input(s): AMMONIA in the  last 168 hours. Coagulation Profile: Recent Labs  Lab 09/14/19 0432 09/15/19 0713 09/17/19 0530 09/18/19 0518 09/19/19 0430  INR 2.1* 1.8* 2.3* 2.1* 2.4*   Cardiac Enzymes: No results for input(s): CKTOTAL, CKMB, CKMBINDEX, TROPONINI in the last 168 hours. BNP (last 3 results) No results for input(s): PROBNP in the last 8760 hours. HbA1C: No results for input(s): HGBA1C in the last 72 hours. CBG: Recent Labs  Lab 09/18/19 0732 09/18/19 1114 09/18/19 1543 09/18/19 2051 09/19/19 1122  GLUCAP 77 146* 226* 287* 158*   Lipid Profile: No results for input(s): CHOL, HDL, LDLCALC, TRIG, CHOLHDL, LDLDIRECT in the last 72 hours. Thyroid Function Tests: No results for input(s): TSH, T4TOTAL, FREET4, T3FREE, THYROIDAB in the last 72 hours. Anemia Panel: No results for input(s): VITAMINB12, FOLATE, FERRITIN, TIBC, IRON, RETICCTPCT in the last 72 hours. Sepsis Labs: Recent Labs  Lab 09/15/19 0741 09/15/19 1016 09/15/19 1326  PROCALCITON 1.49  --   --   LATICACIDVEN  --  2.0* 1.5    Recent Results (from the past 240 hour(s))  Culture, blood (single)     Status: None (Preliminary result)   Collection Time: 09/15/19 10:16 AM   Specimen: BLOOD LEFT HAND  Result Value Ref Range Status   Specimen Description BLOOD LEFT HAND  Final   Special Requests   Final    AEROBIC BOTTLE ONLY Blood Culture results may not be optimal due to an inadequate volume of blood received in culture bottles   Culture   Final    NO GROWTH 4 DAYS Performed at [facility], [place] [address] [place], [place] [address]    Report Status PENDING  Incomplete  SARS CORONAVIRUS 2 (TAT 6-24 HRS) Nasopharyngeal Nasopharyngeal Swab     Status: None   Collection Time: 09/16/19  1:55 PM   Specimen: Nasopharyngeal Swab  Result Value Ref Range Status   SARS Coronavirus 2 NEGATIVE NEGATIVE Final    Comment: (NOTE) SARS-CoV-2 target nucleic acids are NOT DETECTED. The SARS-CoV-2 RNA is generally detectable in  upper and lower respiratory specimens during the acute phase of infection. Negative results do not preclude SARS-CoV-2 infection, do not rule out co-infections with other pathogens, and should not be used as the sole basis for treatment or other patient management decisions. Negative results must be combined with clinical observations, patient history, and epidemiological information. The expected result is Negative. Fact Sheet for Patients: SugarRoll.be Fact Sheet for Healthcare Providers: https://www.woods-mathews.com/ This test is not yet approved or cleared by the [place] FDA and  has been authorized for detection and/or diagnosis of SARS-CoV-2 by FDA under an Emergency Use Authorization (EUA). This EUA will remain  in effect (meaning this test can be used) for the duration of the COVID-19 declaration under Section 56 4(b)(1) of the Act, 21 U.S.C. section 360bbb-3(b)(1), unless the authorization is terminated or revoked sooner. Performed at [facility], [place] [address], [place], [place] [address]   SARS CORONAVIRUS 2 (TAT  6-24 HRS)     Status: None   Collection Time: 09/19/19  4:00 AM  Result Value Ref Range Status   SARS Coronavirus 2 NEGATIVE NEGATIVE Final    Comment: (NOTE) SARS-CoV-2 target nucleic acids are NOT DETECTED. The SARS-CoV-2 RNA is generally detectable in upper and lower respiratory specimens during the acute phase of infection. Negative results do not preclude SARS-CoV-2 infection, do not rule out co-infections with other pathogens, and should not be used as the sole basis for treatment or other patient management decisions. Negative results must be combined with clinical observations, patient history, and epidemiological information. The expected result is Negative. Fact Sheet for Patients: SugarRoll.be Fact Sheet for Healthcare  Providers: https://www.woods-mathews.com/ This test is not yet approved or cleared by the [place] FDA and  has been authorized for detection and/or diagnosis of SARS-CoV-2 by FDA under an Emergency Use Authorization (EUA). This EUA will remain  in effect (meaning this test can be used) for the duration of the COVID-19 declaration under Section 56 4(b)(1) of the Act, 21 U.S.C. section 360bbb-3(b)(1), unless the authorization is terminated or revoked sooner. Performed at [facility], [place] [address], [place], [place] [address]      RN Pressure Injury Documentation:     Estimated body mass index is 20.05 kg/m as calculated from the following:   Height as of this encounter: 5\' 11"  (1.803 m).   Weight as of this encounter: 65.2 kg.  Malnutrition Type:  Nutrition Problem: Increased nutrient needs Etiology: chronic illness(ESRD on HD)   Malnutrition Characteristics:  Signs/Symptoms: estimated needs   Nutrition Interventions:  Interventions: Ensure Enlive (each supplement provides 350kcal and 20 grams of protein), MVI   Radiology Studies: CT ABDOMEN PELVIS WO CONTRAST  Result Date: 09/17/2019 CLINICAL DATA:  69 year old male with abdominal pain and fever. History of COVID-19. EXAM: CT CHEST, ABDOMEN AND PELVIS WITHOUT CONTRAST TECHNIQUE: Multidetector CT imaging of the chest, abdomen and pelvis was performed following the standard protocol without IV contrast. COMPARISON:  Chest radiograph dated 09/15/2019. FINDINGS: Evaluation of this exam is limited in the absence of intravenous contrast. CT CHEST FINDINGS Cardiovascular: There is no cardiomegaly. There is a high attenuating pericardial effusion adjacent to the left ventricle measuring approximately 13 mm in thickness. This may represent hemorrhagic fluid. Clinical correlation and further evaluation with echocardiogram recommended. 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 Three  vessel coronary vascular calcification noted. There is atherosclerotic calcification of thoracic aorta. The central pulmonary arteries are grossly unremarkable. Mediastinum/Nodes: No hilar or mediastinal adenopathy. The esophagus and the thyroid gland are grossly unremarkable. No mediastinal fluid collection. 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 Bilateral hazy and streaky densities most concerning for atypical infection and in keeping with history of COVID-19. Clinical correlation is recommended. No lobar consolidation, pleural effusion, or pneumothorax. The central airways are patent. Musculoskeletal: No chest wall mass or suspicious bone lesions identified. CT ABDOMEN PELVIS FINDINGS No intra-abdominal free air or free fluid. Hepatobiliary: The liver is unremarkable. No intrahepatic biliary ductal dilatation. There are multiple small stones in the gallbladder. No pericholecystic fluid or evidence of acute cholecystitis by CT. Ultrasound may provide better evaluation. Pancreas: Unremarkable. No pancreatic ductal dilatation or surrounding inflammatory changes. Spleen: Normal in size without focal abnormality. Adrenals/Urinary Tract: The adrenal glands are unremarkable. Moderate bilateral renal parenchyma atrophy. There is no hydronephrosis or nephrolithiasis on either side. Small bilateral renal hypodense lesions are suboptimally characterized on this noncontrast CT. The visualized ureters  appear unremarkable. The urinary bladder is minimally distended. There is apparent diffuse thickening of the bladder wall which may be partly related to underdistention. Cystitis is not excluded. Correlation with urinalysis recommended. 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 There is no hydronephrosis or nephrolithiasis of the transplant kidney. No peritransplant fluid collection identified. The transplant kidney however appears slightly small. Stomach/Bowel: Moderate amount of stool noted  throughout the colon. Small scattered colonic diverticula without active inflammatory changes noted. There is no bowel obstruction or active inflammation. Normal appendix. Vascular/Lymphatic: Advanced aortoiliac atherosclerotic disease. The IVC is unremarkable. No portal venous gas. There is no adenopathy. Reproductive: The prostate and seminal vesicles are grossly unremarkable. Other: There is a left psoas intramuscular hematoma measuring 5.8 x 5.0 cm in greatest axial dimensions and approximately 10 cm in craniocaudal length. Musculoskeletal: Degenerative changes of the spine primarily at L4-L5. No acute osseous pathology. IMPRESSION: 1. Left psoas intramuscular hematoma. 2. Multifocal pneumonia, likely viral etiology including COVID-19. Clinical correlation is recommended. 3. 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 Cardiology consult and further evaluation with echocardiogram recommended. 4. Coronary vascular calcification and Aortic Atherosclerosis (ICD10-I70.0). 5. Cholelithiasis. 6. No hydronephrosis or nephrolithiasis. Right lower quadrant transplant kidney appears slightly atrophic. 7. Mildly thickened bladder wall. Correlation with urinalysis recommended to exclude UTI. These results were called by telephone at the time of interpretation on 09/17/2019 at 9:39 pm to patient's nurse Ruthann Cancer, who verbally acknowledged these results. Electronically Signed   By: Anner Crete M.D.   On: 09/17/2019 21:50   CT CHEST WO CONTRAST  Result Date: 09/17/2019 CLINICAL DATA:  69 year old male with abdominal pain and fever. History of COVID-19. EXAM: CT CHEST, ABDOMEN AND PELVIS WITHOUT CONTRAST TECHNIQUE: Multidetector CT imaging of the chest, abdomen and pelvis was performed following the standard protocol without IV contrast. COMPARISON:  Chest radiograph dated 09/15/2019. FINDINGS: Evaluation of this exam is limited in the absence of intravenous  contrast. CT CHEST FINDINGS Cardiovascular: There is no cardiomegaly. There is a high attenuating pericardial effusion adjacent to the left ventricle measuring approximately 13 mm in thickness. This may represent hemorrhagic fluid. Clinical correlation and further evaluation with echocardiogram recommended. 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 Three vessel coronary vascular calcification noted. There is atherosclerotic calcification of thoracic aorta. The central pulmonary arteries are grossly unremarkable. Mediastinum/Nodes: No hilar or mediastinal adenopathy. The esophagus and the thyroid gland are grossly unremarkable. No mediastinal fluid collection. 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 Bilateral hazy and streaky densities most concerning for atypical infection and in keeping with history of COVID-19. Clinical correlation is recommended. No lobar consolidation, pleural effusion, or pneumothorax. The central airways are patent. Musculoskeletal: No chest wall mass or suspicious bone lesions identified. CT ABDOMEN PELVIS FINDINGS No intra-abdominal free air or free fluid. Hepatobiliary: The liver is unremarkable. No intrahepatic biliary ductal dilatation. There are multiple small stones in the gallbladder. No pericholecystic fluid or evidence of acute cholecystitis by CT. Ultrasound may provide better evaluation. Pancreas: Unremarkable. No pancreatic ductal dilatation or surrounding inflammatory changes. Spleen: Normal in size without focal abnormality. Adrenals/Urinary Tract: The adrenal glands are unremarkable. Moderate bilateral renal parenchyma atrophy. There is no hydronephrosis or nephrolithiasis on either side. Small bilateral renal hypodense lesions are suboptimally characterized on this noncontrast CT. The visualized ureters appear unremarkable. The urinary bladder is minimally distended. There is apparent diffuse thickening of the bladder wall  which may be partly related to  underdistention. Cystitis is not excluded. Correlation with urinalysis recommended. 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 There is no hydronephrosis or nephrolithiasis of the transplant kidney. No peritransplant fluid collection identified. The transplant kidney however appears slightly small. Stomach/Bowel: Moderate amount of stool noted throughout the colon. Small scattered colonic diverticula without active inflammatory changes noted. There is no bowel obstruction or active inflammation. Normal appendix. Vascular/Lymphatic: Advanced aortoiliac atherosclerotic disease. The IVC is unremarkable. No portal venous gas. There is no adenopathy. Reproductive: The prostate and seminal vesicles are grossly unremarkable. Other: There is a left psoas intramuscular hematoma measuring 5.8 x 5.0 cm in greatest axial dimensions and approximately 10 cm in craniocaudal length. Musculoskeletal: Degenerative changes of the spine primarily at L4-L5. No acute osseous pathology. IMPRESSION: 1. Left psoas intramuscular hematoma. 2. Multifocal pneumonia, likely viral etiology including COVID-19. Clinical correlation is recommended. 3. 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 Cardiology consult and further evaluation with echocardiogram recommended. 4. Coronary vascular calcification and Aortic Atherosclerosis (ICD10-I70.0). 5. Cholelithiasis. 6. No hydronephrosis or nephrolithiasis. Right lower quadrant transplant kidney appears slightly atrophic. 7. Mildly thickened bladder wall. Correlation with urinalysis recommended to exclude UTI. These results were called by telephone at the time of interpretation on 09/17/2019 at 9:39 pm to patient's nurse Ruthann Cancer, who verbally acknowledged these results. Electronically Signed   By: Anner Crete M.D.   On: 09/17/2019 21:50   MR LUMBAR SPINE WO CONTRAST  Result Date:  09/18/2019 CLINICAL DATA:  Psoas abscess. EXAM: MRI LUMBAR SPINE WITHOUT CONTRAST TECHNIQUE: Multiplanar, multisequence MR imaging of the lumbar spine was performed. No intravenous contrast was administered. COMPARISON:  Correlation made with CT of the abdomen obtained September 17, 2019 FINDINGS: Segmentation:  Standard. Alignment:  Physiologic. Vertebrae: No fracture, evidence of discitis, or bone lesion. Prominent degenerative changes are seen in the L4-5 endplates. Conus medullaris and cauda equina: Conus extends to the L1 level. Conus and cauda equina appear normal. Paraspinal and other soft tissues: A large left psoas hematoma is noted extending from the level of the 3 no L5. The hematoma measures approximately 10.4 by 5.28 x 5.8 cm (cc, AP, AP), unchanged from prior CT. There is mild edema in the adjacent left iliac muscle. Small cysts are seen in the bilateral native kidneys. 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 Disc levels: L1-2, L2-3: No spinal canal or neural foraminal stenosis. L3-4: Shallow disc bulge with superimposed right foraminal disc protrusion and facet degenerative change resulting in moderate narrowing of the right neural foramen. There is no significant spinal canal stenosis. L4-5: Disc bulge with superimposed right central disc protrusion no associated facet degenerative changes and ligamentum flavum hypertrophy resulting in mild spinal canal stenosis, mild right and mild to moderate left neural foraminal narrowing. L5-S1: Mild facet degenerative changes. No spinal canal or neural foraminal stenosis. IMPRESSION: 1. Large left psoas hematoma, similar in size compared to prior CT. 2. No evidence of discitis/osteomyelitis. 3. 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 Electronically Signed   By: Pedro Earls M.D.    On: 09/18/2019 15:30   ECHO TEE  Result Date: 09/18/2019    TRANSESOPHOGEAL ECHO REPORT   Patient Name:   Jonathan Coulon Sr. Date of Exam: 09/18/2019 Medical Rec #:  [id number]        Height:  71.0 in Accession #:    [id number]       Weight:       144.6 lb Date of Birth:  08-31-1950        BSA:          1.84 m Patient Age:    75 years         BP:           121/65 mmHg Patient Gender: M                HR:           104 bpm. Exam Location:  Inpatient Procedure: Transesophageal Echo, Cardiac Doppler, Color Doppler and Saline            Contrast Bubble Study Indications:    bacteremia  History:        Patient has prior history of Echocardiogram examinations, most                 recent 08/01/2019. Stroke; Risk Factors:Hypertension, Diabetes                 and Former Smoker.  Sonographer:    Jannett Celestine RDCS (AE) Referring Phys: [id number] [place]: After discussion of the risks and benefits of a TEE, an informed consent was obtained from the patient. The transesophogeal probe was passed without difficulty through the esophogus of the patient. Sedation performed by different physician. The patient was monitored while under deep sedation. Anesthestetic sedation was provided intravenously by Anesthesiology: 150mg  of Propofol. Image quality was excellent. The patient's vital signs; including heart rate, blood pressure, and oxygen saturation; remained stable throughout the procedure. The patient developed no complications during the procedure. IMPRESSIONS  1. Left ventricular ejection fraction, by estimation, is 30 to 35%. The left ventricle has severely decreased function. The left ventrical demonstrates regional wall motion abnormalities (see scoring diagram/findings for description). There is moderately increased left ventricular hypertrophy. Left ventricular diastolic function could not be evaluated. There is severe akinesis of the left ventricular, entire apical segment.  2. Right ventricular  systolic function is normal. The right ventricular size is normal.  3. No left atrial/left atrial appendage thrombus was detected.  4. The mitral valve is normal in structure and function. Mild mitral valve regurgitation. No evidence of mitral stenosis.  5. The aortic valve is tricuspid. Aortic valve regurgitation is trivial . No aortic stenosis is present.  6. There is mild (Grade II) plaque involving the descending aorta.  7. Agitated saline contrast bubble study was negative, with no evidence of any interatrial shunt. Conclusion(s)/Recomendation(s): Normal biventricular function without evidence of hemodynamically significant valvular heart disease. No evidence of vegetation/infective endocarditis on this transesophageal echocardiogram. No LA/LAA thrombus identified. Negative bubble study for interatrial shunt. No intracardiac source of embolism detected on this on this transesophageal echocardiogram. FINDINGS  Left Ventricle: Left ventricular ejection fraction, by estimation, is 30 to 35%. The left ventricle has severely decreased function. The left ventricle demonstrates regional wall motion abnormalities. Severe akinesis of the left ventricular, entire apical segment. There is moderately increased. There is moderately increased left ventricular hypertrophy. Left ventricular diastolic function could not be evaluated. Right Ventricle: The right ventricular size is normal. No increase in right ventricular wall thickness. Right ventricular systolic function is normal. Left Atrium: Left atrial size was not assessed. No left atrial/left atrial appendage thrombus was detected. Right Atrium: Right atrial size was not assessed. Pericardium: There is no evidence of pericardial effusion. Mitral Valve:  The mitral valve is normal in structure and function. Mild mitral valve regurgitation. No evidence of mitral valve stenosis. Tricuspid Valve: The tricuspid valve is normal in structure. Tricuspid valve regurgitation is  trivial. There is no evidence of tricuspid valve vegetation. Aortic Valve: The aortic valve is tricuspid. Aortic valve regurgitation is trivial. No aortic stenosis is present. There is no evidence of aortic valve vegetation. Pulmonic Valve: The pulmonic valve was normal in structure. Pulmonic valve regurgitation is trivial. There is no evidence of pulmonic valve vegetation. Aorta: The aortic root and ascending aorta are structurally normal, with no evidence of dilitation. There is mild (Grade II) plaque involving the descending aorta. IAS/Shunts: No atrial level shunt detected by color flow Doppler. Agitated saline contrast was given intravenously to evaluate for intracardiac shunting. Agitated saline contrast bubble study was negative, with no evidence of any interatrial shunt. There  is no evidence of a patent foramen ovale. There is no evidence of an atrial septal defect. Buford Dresser MD Electronically signed by Buford Dresser MD Signature Date/Time: 09/18/2019/3:25:03 PM    Final    Scheduled Meds: .  stroke: mapping our early stages of recovery book   Does not apply Once  . allopurinol  100 mg Oral Daily  . aspirin  81 mg Oral Daily  . atorvastatin  40 mg Oral Daily  . Chlorhexidine Gluconate Cloth  6 each Topical Q0600  . darbepoetin (ARANESP) injection - DIALYSIS  150 mcg Intravenous Q Tue-HD  . feeding supplement (NEPRO CARB STEADY)  237 mL Oral TID BM  . feeding supplement (PRO-STAT SUGAR FREE 64)  30 mL Oral BID  . insulin aspart  0-15 Units Subcutaneous TID WC  . insulin aspart  0-5 Units Subcutaneous QHS  . insulin aspart  3 Units Subcutaneous TID WC  . metoCLOPramide  5 mg Oral BID WC  . metoprolol succinate  50 mg Oral Daily  . multivitamin  1 tablet Oral QHS  . pantoprazole  40 mg Oral Daily  . predniSONE  5 mg Oral Q breakfast  . sodium chloride flush  3 mL Intravenous Q12H  . warfarin  4 mg Oral ONCE-1800  . Warfarin - Pharmacist Dosing Inpatient   Does not apply  q1800   Continuous Infusions: . sodium chloride 10 mL/hr at 09/14/19 1008    LOS: 57 days   Kerney Elbe, DO [facility] PAGER is on AMION  If 7PM-7AM, please contact night-coverage [email or web address]

## 2019-09-19 NOTE — Progress Notes (Signed)
ANTICOAGULATION CONSULT NOTE - Follow Up Consult  Pharmacy Consult for Coumadin Indication: LV thrombus, CVA  No Known Allergies  Patient Measurements: Height: 5\' 11"  (180.3 cm) Weight: 150 lb 2.1 oz (68.1 kg) IBW/kg (Calculated) : 75.3  Vital Signs: Temp: 97.5 F (36.4 C) (02/11 0643) Temp Source: Oral (02/11 0643) BP: 98/68 (02/11 0800) Pulse Rate: 93 (02/11 0800)  Labs: Recent Labs    09/17/19 0530 09/17/19 0530 09/17/19 1239 09/17/19 1239 09/18/19 0518 09/18/19 1045 09/19/19 0430 09/19/19 0658  HGB 9.8*   < > 9.5*   < >  --  10.7*  --  10.1*  HCT 31.1*   < > 30.9*  --   --  35.2*  --  33.5*  PLT 277   < > 276  --   --  289  --  278  LABPROT 25.0*  --   --   --  23.6*  --  26.2*  --   INR 2.3*  --   --   --  2.1*  --  2.4*  --   CREATININE 7.87*  --   --   --  5.97*  --  7.63*  --    < > = values in this interval not displayed.    Estimated Creatinine Clearance: 9.2 mL/min (A) (by C-G formula based on SCr of 7.63 mg/dL (H)).   Assessment: 36 yoM previously on apixaban for LV thrombus, changed to warfarin this admission. Pt noted to have recent stroke on MRI 12/23.   INR has been labile this admit - warfarin held x2 doses 2/2-2/3 for high INR, then had low INR of 1.8 on 2/7. INR remains therapeutic today at 2.4. CBC stable.  CT abdomen pelvis from 2/9 shows L psoas IM hematoma and possible hemopericardium, still thought to be hematoma on MRI lumbar spine. Per discussion with Dr. Alfredia Ferguson, ok to continue warfarin for now.  Goal of Therapy:  INR 2-3 Monitor platelets by anticoagulation protocol: Yes   Plan:  Warfarin 4mg  PO x 1 dose tonight Monitor daily INR, CBC, s/sx bleeding   Elicia Lamp, PharmD, BCPS Please check AMION for all [place] contact numbers Clinical Pharmacist 09/19/2019 8:37 AM

## 2019-09-19 NOTE — Progress Notes (Signed)
Physical Therapy Treatment Patient Details Name: Jonathan [name]. MRN: [id number] DOB: [birth year] Today's Date: 09/19/2019    History of Present Illness 69 y.o. 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 Diagnosed with COVID 6 days prior to coming to ED, where chest x-ray showed multifocal pneumonia. Admitted 07/30/19 for treatment of acute encephalopathy, difficulty swallowing and pneumonia.MRI of the brain showed small acute infarcts in the left pons, left occipital lob and right frontal lobe in addition to chronic infarcts     PT Comments    Pt was seen for transfer to chair and there ex, but is still getting a moderate amount of help for all movement.  He is more lethargic today but had HD appointment.  Pt is objecting to therapy at times but now can be convinced to try therapy and get stronger.  Follow acutely as planned and update his goals of therapy.  Follow Up Recommendations  SNF     Equipment Recommendations  None recommended by PT    Recommendations for Other Services       Precautions / Restrictions Precautions Precautions: Fall Precaution Comments: monitor for skin breakdown Restrictions Weight Bearing Restrictions: No    Mobility  Bed Mobility Overal bed mobility: Needs Assistance Bed Mobility: Supine to Sit     Supine to sit: Min assist        Transfers Overall transfer level: Needs assistance Equipment used: Rolling walker (2 wheeled) Transfers: Sit to/from Stand Sit to Stand: Mod assist;+2 physical assistance;+2 safety/equipment Stand pivot transfers: Mod assist          Ambulation/Gait                 Stairs             Wheelchair Mobility    Modified Rankin (Stroke Patients Only)       Balance     Sitting balance-Leahy Scale: Fair        Standing balance-Leahy Scale: Poor                              Cognition Arousal/Alertness: Awake/alert Behavior During Therapy: Flat affect Overall Cognitive Status: Impaired/Different from baseline Area of Impairment: Problem solving;Awareness;Safety/judgement;Following commands;Attention                 Orientation Level: Time Current Attention Level: Selective Memory: Decreased short-term memory;Decreased recall of precautions Following Commands: Follows one step commands inconsistently;Follows one step commands with increased time Safety/Judgement: Decreased awareness of safety;Decreased awareness of deficits Awareness: Intellectual Problem Solving: Slow processing;Requires verbal cues General Comments: extra time to  respond to PT      Exercises General Exercises - Lower Extremity Ankle Circles/Pumps: AROM;AAROM;5 reps Quad Sets: AROM;10 reps Hip ABduction/ADduction: AAROM;10 reps    General Comments General comments (skin integrity, edema, etc.): responding to PT with statements like why are you here      Pertinent Vitals/Pain Pain Assessment: No/denies pain    Home Living                      Prior Function            PT Goals (current goals can now be found in the care plan section) Acute Rehab PT Goals Patient Stated Goal: get stronger Progress towards PT goals:  Progressing toward goals    Frequency    Min 2X/week      PT Plan Current plan remains appropriate    Co-evaluation PT/OT/SLP Co-Evaluation/Treatment: Yes Reason for Co-Treatment: For patient/therapist safety PT goals addressed during session: Mobility/safety with mobility        AM-PAC PT "6 Clicks" Mobility   Outcome Measure  Help needed turning from your back to your side while in a flat bed without using bedrails?: A Lot Help needed moving from lying on your back to sitting on the side of a flat bed without using bedrails?: A Lot Help needed  moving to and from a bed to a chair (including a wheelchair)?: A Lot Help needed standing up from a chair using your arms (e.g., wheelchair or bedside chair)?: A Lot Help needed to walk in hospital room?: A Lot Help needed climbing 3-5 steps with a railing? : Total 6 Click Score: 11    End of Session Equipment Utilized During Treatment: Gait belt Activity Tolerance: Patient limited by fatigue Patient left: in chair;with call bell/phone within reach;with chair alarm set Nurse Communication: Mobility status PT Visit Diagnosis: Unsteadiness on feet (R26.81);Other abnormalities of gait and mobility (R26.89);Muscle weakness (generalized) (M62.81);Ataxic gait (R26.0);Difficulty in walking, not elsewhere classified (R26.2)     Time: 1230-1305 PT Time Calculation (min) (ACUTE ONLY): 35 min  Charges:  $Therapeutic Exercise: 8-22 mins $Therapeutic Activity: 8-22 mins                   Ramond Dial 09/19/2019, 11:07 PM   Mee Hives, PT MS Acute Rehab Dept. Number: [place] and [place]

## 2019-09-19 NOTE — Consult Note (Signed)
Reason for Consult:Left psoas hematoma Referring Physician: Asencion Gowda Sr. is an 69 y.o. male.  HPI: Jonathan [name] has been hospitalized for nearly 2 months. He had an abd CT on 2/9 for abd pain and FUO which incidentally found a psoas hematoma on the left. He is on anticoagulants. Orthopedic surgery was asked to consult. He denies any pelvic or leg pain. He denies any pain with ambulation.  Past Medical History:  Diagnosis Date  . Diabetes mellitus without complication ([place])   . FUO (fever of unknown origin) 09/17/2019  . Hypertension   . Renal disorder 2015   right kidney transplant  . Stroke [facility])     Past Surgical History:  Procedure Laterality Date  . BACK SURGERY     Has had 2 back surgeries  . Depression    . HAND SURGERY Right   . HAND SURGERY    . IR REMOVAL TUN CV CATH W/O FL  09/03/2019  . KIDNEY TRANSPLANT     November 2015  . Memory loss    . NEPHRECTOMY TRANSPLANTED ORGAN      Family History  Problem Relation Age of Onset  . Diabetes Mellitus II Mother   . Diabetes Mellitus II Brother     Social History:  reports that he has quit smoking. He has never used smokeless tobacco. He reports that he does not drink alcohol or use drugs.  Allergies: No Known Allergies  Medications: I have reviewed the patient's current medications.  Results for orders placed or performed during the hospital encounter of 07/30/19 (from the past 48 hour(s))  Glucose, capillary     Status: Abnormal   Collection Time: 09/17/19 11:37 AM  Result Value Ref Range   Glucose-Capillary 354 (H) 70 - 99 mg/dL  CBC     Status: Abnormal   Collection Time: 09/17/19 12:39 PM  Result Value Ref Range   WBC 6.7 4.0 - 10.5 K/uL   RBC 3.01 (L) 4.22 - 5.81 MIL/uL   Hemoglobin 9.5 (L) 13.0 - 17.0 g/dL   HCT 30.9 (L) 39.0 - 52.0 %   MCV 102.7 (H) 80.0 - 100.0 fL   MCH 31.6 26.0 - 34.0 pg   MCHC 30.7 30.0 - 36.0 g/dL   RDW 17.6 (H) 11.5 - 15.5 %   Platelets 276 150 - 400 K/uL   nRBC 0.0 0.0 -  0.2 %    Comment: Performed at [facility], [address] [place], [place] [address]  Hepatitis B surface antigen     Status: None   Collection Time: 09/17/19 12:39 PM  Result Value Ref Range   Hepatitis B Surface Ag NON REACTIVE NON REACTIVE    Comment: Performed at [facility] [address], [place], [place] [address]  Glucose, capillary     Status: Abnormal   Collection Time: 09/17/19  4:42 PM  Result Value Ref Range   Glucose-Capillary 149 (H) 70 - 99 mg/dL  Glucose, capillary     Status: Abnormal   Collection Time: 09/17/19 10:09 PM  Result Value Ref Range   Glucose-Capillary 56 (L) 70 - 99 mg/dL  Glucose, capillary     Status: None   Collection Time: 09/18/19 12:01 AM  Result Value Ref Range   Glucose-Capillary 82 70 - 99 mg/dL  Glucose, capillary     Status: None   Collection Time: 09/18/19  4:53 AM  Result Value Ref Range   Glucose-Capillary 90 70 - 99 mg/dL  Protime-INR  Status: Abnormal   Collection Time: 09/18/19  5:18 AM  Result Value Ref Range   Prothrombin Time 23.6 (H) 11.4 - 15.2 seconds   INR 2.1 (H) 0.8 - 1.2    Comment: (NOTE) INR goal varies based on device and disease states. Performed at [facility], [place] [address] [place], [place] [address]   Renal function panel     Status: Abnormal   Collection Time: 09/18/19  5:18 AM  Result Value Ref Range   Sodium 137 135 - 145 mmol/L   Potassium 4.0 3.5 - 5.1 mmol/L   Chloride 96 (L) 98 - 111 mmol/L   CO2 28 22 - 32 mmol/L   Glucose, Bld 91 70 - 99 mg/dL   BUN 32 (H) 8 - 23 mg/dL   Creatinine, Ser 5.97 (H) 0.61 - 1.24 mg/dL   Calcium 8.6 (L) 8.9 - 10.3 mg/dL   Phosphorus 3.7 2.5 - 4.6 mg/dL   Albumin 2.5 (L) 3.5 - 5.0 g/dL   GFR calc non Af Amer 9 (L) >60 mL/min   GFR calc Af Amer 10 (L) >60 mL/min   Anion gap 13 5 - 15    Comment: Performed at [facility] [address], [place], [place] [address]  Glucose, capillary     Status: None   Collection Time: 09/18/19  7:32  AM  Result Value Ref Range   Glucose-Capillary 77 70 - 99 mg/dL  CBC with Differential/Platelet     Status: Abnormal   Collection Time: 09/18/19 10:45 AM  Result Value Ref Range   WBC 9.2 4.0 - 10.5 K/uL   RBC 3.42 (L) 4.22 - 5.81 MIL/uL   Hemoglobin 10.7 (L) 13.0 - 17.0 g/dL   HCT 35.2 (L) 39.0 - 52.0 %   MCV 102.9 (H) 80.0 - 100.0 fL   MCH 31.3 26.0 - 34.0 pg   MCHC 30.4 30.0 - 36.0 g/dL   RDW 17.8 (H) 11.5 - 15.5 %   Platelets 289 150 - 400 K/uL   nRBC 0.0 0.0 - 0.2 %   Neutrophils Relative % 63 %   Neutro Abs 5.9 1.7 - 7.7 K/uL   Lymphocytes Relative 21 %   Lymphs Abs 2.0 0.7 - 4.0 K/uL   Monocytes Relative 11 %   Monocytes Absolute 1.0 0.1 - 1.0 K/uL   Eosinophils Relative 2 %   Eosinophils Absolute 0.2 0.0 - 0.5 K/uL   Basophils Relative 1 %   Basophils Absolute 0.1 0.0 - 0.1 K/uL   Immature Granulocytes 2 %   Abs Immature Granulocytes 0.15 (H) 0.00 - 0.07 K/uL    Comment: Performed at [facility], [place] [address] [place], [place] [address]  Magnesium     Status: None   Collection Time: 09/18/19 10:45 AM  Result Value Ref Range   Magnesium 2.1 1.7 - 2.4 mg/dL    Comment: Performed at [facility] [address] [place], [place] [address]  Hepatic function panel     Status: Abnormal   Collection Time: 09/18/19 10:45 AM  Result Value Ref Range   Total Protein 6.8 6.5 - 8.1 g/dL   Albumin 2.4 (L) 3.5 - 5.0 g/dL   AST 22 15 - 41 U/L   ALT 22 0 - 44 U/L   Alkaline Phosphatase 72 38 - 126 U/L   Total Bilirubin 0.7 0.3 - 1.2 mg/dL   Bilirubin, Direct 0.1 0.0 - 0.2 mg/dL   Indirect Bilirubin 0.6 0.3 -  0.9 mg/dL    Comment: Performed at [facility], [place] [address], [place], [place] [address]  Sedimentation rate     Status: Abnormal   Collection Time: 09/18/19 10:45 AM  Result Value Ref Range   Sed Rate 73 (H) 0 - 16 mm/hr    Comment: Performed at [facility] [address], [place], [place] [address]  Glucose, capillary     Status:  Abnormal   Collection Time: 09/18/19 11:14 AM  Result Value Ref Range   Glucose-Capillary 146 (H) 70 - 99 mg/dL  C-reactive protein     Status: Abnormal   Collection Time: 09/18/19  2:18 PM  Result Value Ref Range   CRP 5.5 (H) <1.0 mg/dL    Comment: Performed at [facility] [address], [place], [place] [address]  Glucose, capillary     Status: Abnormal   Collection Time: 09/18/19  3:43 PM  Result Value Ref Range   Glucose-Capillary 226 (H) 70 - 99 mg/dL  Glucose, capillary     Status: Abnormal   Collection Time: 09/18/19  8:51 PM  Result Value Ref Range   Glucose-Capillary 287 (H) 70 - 99 mg/dL  SARS CORONAVIRUS 2 (TAT 6-24 HRS)     Status: None   Collection Time: 09/19/19  4:00 AM  Result Value Ref Range   SARS Coronavirus 2 NEGATIVE NEGATIVE    Comment: (NOTE) SARS-CoV-2 target nucleic acids are NOT DETECTED. The SARS-CoV-2 RNA is generally detectable in upper and lower respiratory specimens during the acute phase of infection. Negative results do not preclude SARS-CoV-2 infection, do not rule out co-infections with other pathogens, and should not be used as the sole basis for treatment or other patient management decisions. Negative results must be combined with clinical observations, patient history, and epidemiological information. The expected result is Negative. Fact Sheet for Patients: SugarRoll.be Fact Sheet for Healthcare Providers: https://www.woods-mathews.com/ This test is not yet approved or cleared by the [place] FDA and  has been authorized for detection and/or diagnosis of SARS-CoV-2 by FDA under an Emergency Use Authorization (EUA). This EUA will remain  in effect (meaning this test can be used) for the duration of the COVID-19 declaration under Section 56 4(b)(1) of the Act, 21 U.S.C. section 360bbb-3(b)(1), unless the authorization is terminated or revoked sooner. Performed at [facility], [place] [address], [place], [place] [address]   Protime-INR     Status: Abnormal   Collection Time: 09/19/19  4:30 AM  Result Value Ref Range   Prothrombin Time 26.2 (H) 11.4 - 15.2 seconds   INR 2.4 (H) 0.8 - 1.2    Comment: (NOTE) INR goal varies based on device and disease states. Performed at [facility], [place] [address], [place], [place] [address]   Renal function panel     Status: Abnormal   Collection Time: 09/19/19  4:30 AM  Result Value Ref Range   Sodium 135 135 - 145 mmol/L   Potassium 4.5 3.5 - 5.1 mmol/L   Chloride 91 (L) 98 - 111 mmol/L   CO2 25 22 - 32 mmol/L   Glucose, Bld 175 (H) 70 - 99 mg/dL   BUN 47 (H) 8 - 23 mg/dL   Creatinine, Ser 7.63 (H) 0.61 - 1.24 mg/dL   Calcium 8.9 8.9 - 10.3 mg/dL   Phosphorus 3.7 2.5 - 4.6 mg/dL   Albumin 2.4 (L) 3.5 - 5.0 g/dL   GFR calc non Af Amer 7 (  L) >60 mL/min   GFR calc Af Amer 8 (L) >60 mL/min   Anion gap 19 (H) 5 - 15    Comment: Performed at [facility] [address], [place], [place] [address]  CBC     Status: Abnormal   Collection Time: 09/19/19  6:58 AM  Result Value Ref Range   WBC 9.1 4.0 - 10.5 K/uL   RBC 3.27 (L) 4.22 - 5.81 MIL/uL   Hemoglobin 10.1 (L) 13.0 - 17.0 g/dL   HCT 33.5 (L) 39.0 - 52.0 %   MCV 102.4 (H) 80.0 - 100.0 fL   MCH 30.9 26.0 - 34.0 pg   MCHC 30.1 30.0 - 36.0 g/dL   RDW 17.7 (H) 11.5 - 15.5 %   Platelets 278 150 - 400 K/uL   nRBC 0.0 0.0 - 0.2 %    Comment: Performed at [facility], [place] [address], [place], [place] [address]    CT ABDOMEN PELVIS WO CONTRAST  Result Date: 09/17/2019 CLINICAL DATA:  69 year old male with abdominal pain and fever. History of COVID-19. EXAM: CT CHEST, ABDOMEN AND PELVIS WITHOUT CONTRAST TECHNIQUE: Multidetector CT imaging of the chest, abdomen and pelvis was performed following the standard protocol without IV contrast. COMPARISON:  Chest radiograph dated 09/15/2019. FINDINGS: Evaluation of this exam is limited in the absence of  intravenous contrast. CT CHEST FINDINGS Cardiovascular: There is no cardiomegaly. There is a high attenuating pericardial effusion adjacent to the left ventricle measuring approximately 13 mm in thickness. This may represent hemorrhagic fluid. Clinical correlation and further evaluation with echocardiogram recommended. 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 Three vessel coronary vascular calcification noted. There is atherosclerotic calcification of thoracic aorta. The central pulmonary arteries are grossly unremarkable. Mediastinum/Nodes: No hilar or mediastinal adenopathy. The esophagus and the thyroid gland are grossly unremarkable. No mediastinal fluid collection. 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 Bilateral hazy and streaky densities most concerning for atypical infection and in keeping with history of COVID-19. Clinical correlation is recommended. No lobar consolidation, pleural effusion, or pneumothorax. The central airways are patent. Musculoskeletal: No chest wall mass or suspicious bone lesions identified. CT ABDOMEN PELVIS FINDINGS No intra-abdominal free air or free fluid. Hepatobiliary: The liver is unremarkable. No intrahepatic biliary ductal dilatation. There are multiple small stones in the gallbladder. No pericholecystic fluid or evidence of acute cholecystitis by CT. Ultrasound may provide better evaluation. Pancreas: Unremarkable. No pancreatic ductal dilatation or surrounding inflammatory changes. Spleen: Normal in size without focal abnormality. Adrenals/Urinary Tract: The adrenal glands are unremarkable. Moderate bilateral renal parenchyma atrophy. There is no hydronephrosis or nephrolithiasis on either side. Small bilateral renal hypodense lesions are suboptimally characterized on this noncontrast CT. The visualized ureters appear unremarkable. The urinary bladder is minimally distended. There is apparent diffuse thickening of the  bladder wall which may be partly related to underdistention. Cystitis is not excluded. Correlation with urinalysis recommended. 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 There is no hydronephrosis or nephrolithiasis of the transplant kidney. No peritransplant fluid collection identified. The transplant kidney however appears slightly small. Stomach/Bowel: Moderate amount of stool noted throughout the colon. Small scattered colonic diverticula without active inflammatory changes noted. There is no bowel obstruction or active inflammation. Normal appendix. Vascular/Lymphatic: Advanced aortoiliac atherosclerotic disease. The IVC is unremarkable. No portal venous gas. There is no adenopathy. Reproductive: The prostate and seminal vesicles are grossly unremarkable. Other: There is a left psoas intramuscular hematoma measuring 5.8 x 5.0  cm in greatest axial dimensions and approximately 10 cm in craniocaudal length. Musculoskeletal: Degenerative changes of the spine primarily at L4-L5. No acute osseous pathology. IMPRESSION: 1. Left psoas intramuscular hematoma. 2. Multifocal pneumonia, likely viral etiology including COVID-19. Clinical correlation is recommended. 3. 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 Cardiology consult and further evaluation with echocardiogram recommended. 4. Coronary vascular calcification and Aortic Atherosclerosis (ICD10-I70.0). 5. Cholelithiasis. 6. No hydronephrosis or nephrolithiasis. Right lower quadrant transplant kidney appears slightly atrophic. 7. Mildly thickened bladder wall. Correlation with urinalysis recommended to exclude UTI. These results were called by telephone at the time of interpretation on 09/17/2019 at 9:39 pm to patient's nurse Ruthann Cancer, who verbally acknowledged these results. Electronically Signed   By: Anner Crete M.D.   On: 09/17/2019 21:50   CT CHEST WO CONTRAST  Result Date:  09/17/2019 CLINICAL DATA:  69 year old male with abdominal pain and fever. History of COVID-19. EXAM: CT CHEST, ABDOMEN AND PELVIS WITHOUT CONTRAST TECHNIQUE: Multidetector CT imaging of the chest, abdomen and pelvis was performed following the standard protocol without IV contrast. COMPARISON:  Chest radiograph dated 09/15/2019. FINDINGS: Evaluation of this exam is limited in the absence of intravenous contrast. CT CHEST FINDINGS Cardiovascular: There is no cardiomegaly. There is a high attenuating pericardial effusion adjacent to the left ventricle measuring approximately 13 mm in thickness. This may represent hemorrhagic fluid. Clinical correlation and further evaluation with echocardiogram recommended. 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 Three vessel coronary vascular calcification noted. There is atherosclerotic calcification of thoracic aorta. The central pulmonary arteries are grossly unremarkable. Mediastinum/Nodes: No hilar or mediastinal adenopathy. The esophagus and the thyroid gland are grossly unremarkable. No mediastinal fluid collection. 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 Bilateral hazy and streaky densities most concerning for atypical infection and in keeping with history of COVID-19. Clinical correlation is recommended. No lobar consolidation, pleural effusion, or pneumothorax. The central airways are patent. Musculoskeletal: No chest wall mass or suspicious bone lesions identified. CT ABDOMEN PELVIS FINDINGS No intra-abdominal free air or free fluid. Hepatobiliary: The liver is unremarkable. No intrahepatic biliary ductal dilatation. There are multiple small stones in the gallbladder. No pericholecystic fluid or evidence of acute cholecystitis by CT. Ultrasound may provide better evaluation. Pancreas: Unremarkable. No pancreatic ductal dilatation or surrounding inflammatory changes. Spleen: Normal in size without focal abnormality.  Adrenals/Urinary Tract: The adrenal glands are unremarkable. Moderate bilateral renal parenchyma atrophy. There is no hydronephrosis or nephrolithiasis on either side. Small bilateral renal hypodense lesions are suboptimally characterized on this noncontrast CT. The visualized ureters appear unremarkable. The urinary bladder is minimally distended. There is apparent diffuse thickening of the bladder wall which may be partly related to underdistention. Cystitis is not excluded. Correlation with urinalysis recommended. 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 There is no hydronephrosis or nephrolithiasis of the transplant kidney. No peritransplant fluid collection identified. The transplant kidney however appears slightly small. Stomach/Bowel: Moderate amount of stool noted throughout the colon. Small scattered colonic diverticula without active inflammatory changes noted. There is no bowel obstruction or active inflammation. Normal appendix. Vascular/Lymphatic: Advanced aortoiliac atherosclerotic disease. The IVC is unremarkable. No portal venous gas. There is no adenopathy. Reproductive: The prostate and seminal vesicles are grossly unremarkable. Other: There is a left psoas intramuscular hematoma measuring 5.8 x 5.0 cm in greatest axial dimensions and approximately 10 cm in craniocaudal length. Musculoskeletal: Degenerative changes of the spine primarily at L4-L5. No acute osseous  pathology. IMPRESSION: 1. Left psoas intramuscular hematoma. 2. Multifocal pneumonia, likely viral etiology including COVID-19. Clinical correlation is recommended. 3. 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 Cardiology consult and further evaluation with echocardiogram recommended. 4. Coronary vascular calcification and Aortic Atherosclerosis (ICD10-I70.0). 5. Cholelithiasis. 6. No hydronephrosis or nephrolithiasis. Right lower quadrant transplant kidney appears  slightly atrophic. 7. Mildly thickened bladder wall. Correlation with urinalysis recommended to exclude UTI. These results were called by telephone at the time of interpretation on 09/17/2019 at 9:39 pm to patient's nurse Ruthann Cancer, who verbally acknowledged these results. Electronically Signed   By: Anner Crete M.D.   On: 09/17/2019 21:50   MR LUMBAR SPINE WO CONTRAST  Result Date: 09/18/2019 CLINICAL DATA:  Psoas abscess. EXAM: MRI LUMBAR SPINE WITHOUT CONTRAST TECHNIQUE: Multiplanar, multisequence MR imaging of the lumbar spine was performed. No intravenous contrast was administered. COMPARISON:  Correlation made with CT of the abdomen obtained September 17, 2019 FINDINGS: Segmentation:  Standard. Alignment:  Physiologic. Vertebrae: No fracture, evidence of discitis, or bone lesion. Prominent degenerative changes are seen in the L4-5 endplates. Conus medullaris and cauda equina: Conus extends to the L1 level. Conus and cauda equina appear normal. Paraspinal and other soft tissues: A large left psoas hematoma is noted extending from the level of the 3 no L5. The hematoma measures approximately 10.4 by 5.28 x 5.8 cm (cc, AP, AP), unchanged from prior CT. There is mild edema in the adjacent left iliac muscle. Small cysts are seen in the bilateral native kidneys. 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 Disc levels: L1-2, L2-3: No spinal canal or neural foraminal stenosis. L3-4: Shallow disc bulge with superimposed right foraminal disc protrusion and facet degenerative change resulting in moderate narrowing of the right neural foramen. There is no significant spinal canal stenosis. L4-5: Disc bulge with superimposed right central disc protrusion no associated facet degenerative changes and ligamentum flavum hypertrophy resulting in mild spinal canal stenosis, mild right and mild to moderate left neural foraminal narrowing. L5-S1: Mild facet degenerative changes. No spinal canal or neural  foraminal stenosis. IMPRESSION: 1. Large left psoas hematoma, similar in size compared to prior CT. 2. No evidence of discitis/osteomyelitis. 3. 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 Electronically Signed   By: Pedro Earls M.D.   On: 09/18/2019 15:30   ECHO TEE  Result Date: 09/18/2019    TRANSESOPHOGEAL ECHO REPORT   Patient Name:   Konstanty Seidner Sr. Date of Exam: 09/18/2019 Medical Rec #:  [id number]        Height:       71.0 in Accession #:    [id number]       Weight:       144.6 lb Date of Birth:  19-Apr-1951        BSA:          1.84 m Patient Age:    36 years         BP:           121/65 mmHg Patient Gender: M                HR:           104 bpm. Exam Location:  Inpatient Procedure: Transesophageal Echo, Cardiac Doppler, Color Doppler and Saline  Contrast Bubble Study Indications:    bacteremia  History:        Patient has prior history of Echocardiogram examinations, most                 recent 08/01/2019. Stroke; Risk Factors:Hypertension, Diabetes                 and Former Smoker.  Sonographer:    Jannett Celestine RDCS (AE) Referring Phys: [id number] [place]: After discussion of the risks and benefits of a TEE, an informed consent was obtained from the patient. The transesophogeal probe was passed without difficulty through the esophogus of the patient. Sedation performed by different physician. The patient was monitored while under deep sedation. Anesthestetic sedation was provided intravenously by Anesthesiology: 150mg  of Propofol. Image quality was excellent. The patient's vital signs; including heart rate, blood pressure, and oxygen saturation; remained stable throughout the procedure. The patient developed no complications during the procedure. IMPRESSIONS  1. Left ventricular ejection fraction, by estimation, is  30 to 35%. The left ventricle has severely decreased function. The left ventrical demonstrates regional wall motion abnormalities (see scoring diagram/findings for description). There is moderately increased left ventricular hypertrophy. Left ventricular diastolic function could not be evaluated. There is severe akinesis of the left ventricular, entire apical segment.  2. Right ventricular systolic function is normal. The right ventricular size is normal.  3. No left atrial/left atrial appendage thrombus was detected.  4. The mitral valve is normal in structure and function. Mild mitral valve regurgitation. No evidence of mitral stenosis.  5. The aortic valve is tricuspid. Aortic valve regurgitation is trivial . No aortic stenosis is present.  6. There is mild (Grade II) plaque involving the descending aorta.  7. Agitated saline contrast bubble study was negative, with no evidence of any interatrial shunt. Conclusion(s)/Recomendation(s): Normal biventricular function without evidence of hemodynamically significant valvular heart disease. No evidence of vegetation/infective endocarditis on this transesophageal echocardiogram. No LA/LAA thrombus identified. Negative bubble study for interatrial shunt. No intracardiac source of embolism detected on this on this transesophageal echocardiogram. FINDINGS  Left Ventricle: Left ventricular ejection fraction, by estimation, is 30 to 35%. The left ventricle has severely decreased function. The left ventricle demonstrates regional wall motion abnormalities. Severe akinesis of the left ventricular, entire apical segment. There is moderately increased. There is moderately increased left ventricular hypertrophy. Left ventricular diastolic function could not be evaluated. Right Ventricle: The right ventricular size is normal. No increase in right ventricular wall thickness. Right ventricular systolic function is normal. Left Atrium: Left atrial size was not assessed. No left  atrial/left atrial appendage thrombus was detected. Right Atrium: Right atrial size was not assessed. Pericardium: There is no evidence of pericardial effusion. Mitral Valve: The mitral valve is normal in structure and function. Mild mitral valve regurgitation. No evidence of mitral valve stenosis. Tricuspid Valve: The tricuspid valve is normal in structure. Tricuspid valve regurgitation is trivial. There is no evidence of tricuspid valve vegetation. Aortic Valve: The aortic valve is tricuspid. Aortic valve regurgitation is trivial. No aortic stenosis is present. There is no evidence of aortic valve vegetation. Pulmonic Valve: The pulmonic valve was normal in structure. Pulmonic valve regurgitation is trivial. There is no evidence of pulmonic valve vegetation. Aorta: The aortic root and ascending aorta are structurally normal, with no evidence of dilitation. There is mild (Grade II) plaque involving the descending aorta. IAS/Shunts: No atrial level shunt detected by color flow Doppler. Agitated saline contrast was given intravenously  to evaluate for intracardiac shunting. Agitated saline contrast bubble study was negative, with no evidence of any interatrial shunt. There  is no evidence of a patent foramen ovale. There is no evidence of an atrial septal defect. Buford Dresser MD Electronically signed by Buford Dresser MD Signature Date/Time: 09/18/2019/3:25:03 PM    Final     Review of Systems  HENT: Negative for ear discharge, ear pain, hearing loss and tinnitus.   Eyes: Negative for photophobia and pain.  Respiratory: Negative for cough and shortness of breath.   Cardiovascular: Negative for chest pain.  Gastrointestinal: Negative for abdominal pain, nausea and vomiting.  Genitourinary: Negative for dysuria, flank pain, frequency and urgency.  Musculoskeletal: Negative for back pain, myalgias and neck pain.  Neurological: Negative for dizziness and headaches.  Hematological: Does not  bruise/bleed easily.  Psychiatric/Behavioral: The patient is not nervous/anxious.    Blood pressure 93/60, pulse 84, temperature (!) 97.5 F (36.4 C), temperature source Oral, resp. rate 16, height 5\' 11"  (1.803 m), weight 68.1 kg, SpO2 95 %. Physical Exam  Constitutional: He appears well-developed and well-nourished. No distress.  HENT:  Head: Normocephalic and atraumatic.  Eyes: Conjunctivae are normal. Right eye exhibits no discharge. Left eye exhibits no discharge. No scleral icterus.  Cardiovascular: Normal rate and regular rhythm.  Respiratory: Effort normal. No respiratory distress.  Musculoskeletal:     Cervical back: Normal range of motion.     Comments: LLE No traumatic wounds, ecchymosis, or rash  Nontender, no pain with PROM, AROM, or abd/adduction/extension against resistance  No knee or ankle effusion  Knee stable to varus/ valgus and anterior/posterior stress  Sens DPN, SPN, TN insensate  Motor EHL, ext, flex, evers 5/5  DP 1+, PT 0, No significant edema  Neurological: He is alert.  Skin: Skin is warm and dry. He is not diaphoretic.  Psychiatric: He has a normal mood and affect. His behavior is normal.    Assessment/Plan: Left psoas hematoma -- Would not recommend any intervention at this time. Unlikely to be infected given lack of symptomatology. 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

## 2019-09-19 NOTE — Progress Notes (Signed)
Occupational Therapy Treatment Patient Details Name: Jonathan Langseth Sr. MRN: [id number] DOB: Dec 13, 1950 Today's Date: 09/19/2019    History of present illness 69 y.o. 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 Diagnosed with COVID 6 days prior to coming to ED, where chest x-ray showed multifocal pneumonia. Admitted 07/30/19 for treatment of acute encephalopathy, difficulty swallowing and pneumonia.MRI of the brain showed small acute infarcts in the left pons, left occipital lob and right frontal lobe in addition to chronic infarcts    OT comments  Pt making slow progress with functional goals. Pt required encouragement for EOB/OOB activity. Pt sat EOB with min A and session focused on grooming/hygiene seated EOB, standing at RW for hygiene after BM and transfer to recliner, UB dressing with cues to initiate and complete. OT will continue to follow acutely  Follow Up Recommendations  SNF;Supervision/Assistance - 24 hour    Equipment Recommendations  None recommended by OT    Recommendations for Other Services      Precautions / Restrictions Precautions Precautions: Fall Restrictions Weight Bearing Restrictions: No       Mobility Bed Mobility Overal bed mobility: Needs Assistance Bed Mobility: Supine to Sit;Sit to Supine     Supine to sit: Min assist     General bed mobility comments: pt required encouragement for EOB and OOB  Transfers Overall transfer level: Needs assistance Equipment used: Rolling walker (2 wheeled) Transfers: Sit to/from Stand Sit to Stand: Mod assist;+2 physical assistance Stand pivot transfers: Mod assist;Min assist            Balance Overall balance assessment: Needs assistance Sitting-balance support: Feet supported Sitting balance-Leahy Scale: Fair Sitting  balance - Comments: min guard to close supervision    Standing balance support: Bilateral upper extremity supported;During functional activity Standing balance-Leahy Scale: Poor Standing balance comment: stood at RW with mod A for balance/support during hygiene from BM total A                           ADL either performed or assessed with clinical judgement   ADL Overall ADL's : Needs assistance/impaired     Grooming: Wash/dry face;Sitting;Wash/dry hands;Min guard           Upper Body Dressing : Minimal assistance;Sitting       Toilet Transfer: Moderate assistance;Minimal assistance;+2 for physical assistance;RW;Stand-pivot;Cueing for safety Toilet Transfer Details (indicate cue type and reason): simulated  recliner   Toileting - Clothing Manipulation Details (indicate cue type and reason): Total A for hygiene after +BM and clothing mgt standing at RW with mod A for balance and support, pt unaware he had BM once he stood     Functional mobility during ADLs: Moderate assistance;+2 for physical assistance;Cueing for safety;Rolling walker       Vision Baseline Vision/History: Wears glasses Wears Glasses: At all times Patient Visual Report: No change from baseline     Perception     Praxis      Cognition Arousal/Alertness: Awake/alert Behavior During Therapy: Flat affect Overall Cognitive Status: Impaired/Different from baseline Area of Impairment: Problem solving;Awareness;Safety/judgement;Following commands                   Current Attention Level: Selective Memory: Decreased short-term memory Following Commands: Follows one step commands inconsistently;Follows one step commands with increased time Safety/Judgement: Decreased awareness of  safety   Problem Solving: Slow processing;Requires verbal cues          Exercises     Shoulder Instructions       General Comments      Pertinent Vitals/ Pain       Pain Assessment: No/denies  pain Pain Score: 0-No pain Pain Intervention(s): Monitored during session  Home Living                                          Prior Functioning/Environment              Frequency  Min 2X/week        Progress Toward Goals  OT Goals(current goals can now be found in the care plan section)  Progress towards OT goals: Progressing toward goals     Plan Discharge plan remains appropriate;Frequency remains appropriate    Co-evaluation    PT/OT/SLP Co-Evaluation/Treatment: Yes Reason for Co-Treatment: Complexity of the patient's impairments (multi-system involvement);Necessary to address cognition/behavior during functional activity;For patient/therapist safety   OT goals addressed during session: ADL's and self-care;Proper use of Adaptive equipment and DME      AM-PAC OT "6 Clicks" Daily Activity     Outcome Measure   Help from another person eating meals?: A Little Help from another person taking care of personal grooming?: A Little Help from another person toileting, which includes using toliet, bedpan, or urinal?: Total Help from another person bathing (including washing, rinsing, drying)?: A Lot Help from another person to put on and taking off regular upper body clothing?: A Little Help from another person to put on and taking off regular lower body clothing?: A Lot 6 Click Score: 14    End of Session Equipment Utilized During Treatment: Gait belt;Rolling walker  OT Visit Diagnosis: Unsteadiness on feet (R26.81);Other symptoms and signs involving cognitive function;Other abnormalities of gait and mobility (R26.89)   Activity Tolerance Patient limited by fatigue   Patient Left with call bell/phone within reach;in chair;with chair alarm set;with nursing/sitter in room   Nurse Communication          Time: [id number] OT Time Calculation (min): 25 min  Charges: OT General Charges $OT Visit: 1 Visit OT Treatments $Self Care/Home  Management : 8-22 mins    Britt Bottom 09/19/2019, 2:43 PM

## 2019-09-19 NOTE — Progress Notes (Signed)
Subjective:   Seen on hd, no cos   Objective Vital signs in last 24 hours: Vitals:   09/19/19 0900 09/19/19 0930 09/19/19 1000 09/19/19 1028  BP: (!) 96/57 109/67 96/68 119/68  Pulse: 85 84 86 84  Resp:    18  Temp:    98.2 F (36.8 C)  TempSrc:    Oral  SpO2:    94%  Weight:    65.2 kg  Height:       Weight change: -2.7 kg  Physical Exam General:Alert ,chronically ill appearing male in NAD on HD  Heart:RRR, no m,r,g Lungs:CTAB, nml WOB Abdomen:soft, NT ND Extremities:no LE edema Dialysis Access:RU AVF patent on hD   Dialysis Orders: TTSG/O -[facility] when COVID 19 negative 4 hr 15 min 180NRE 400/800 80.5 kg 2.0 K/ 2.0 Ca R AVF -Heparin 8000 units IV TIW -Mircera 75 mcg IV q 2 weeks (last dose 07/25/19) -Venofer 50 mg IV weekly -Parsabiv 7.5 mg IV TIW   Problem/Plan  1.Acute COVID-19 pneumonia - s/p remdesivir &Decadron. Per primary. Now out of 21 day isolation window but still required to have 2 negative COVID 19 test before he can return to [facility]- last + 1/14. Not retested since. Pt can dialyze at [facility] until COVID tests negative. 2. Fevers Recurrent  -T max 99.3 past 3 days/  TEE no vegetations / Monitor off Antibx / ID has signed off / Ortho consult =  Left Psoas hematoma on CT ="unlikley infected  No intervention currently " 2.ESRD- on HD TTS. K4.5.TDC removed1/26.Marland Kitchenif time needs to be shortened - it shouldn't be <3.5 3. Anemiaof CKD-Hgb10.1 ContinueAranesp to 115mcg qwk. Transfuse prn.tsat 34%. 4. Secondary hyperparathyroidism-Ca/P ok. No binders or VDRA. No parsabiv because not on formulary.  5.HTN/volume-Does not appear volume overloaded. Blood pressure well controlled.Well below EDW, will need to lower at d/c 6. Nutrition- Albumin low2.2>2.5.> 2.4. Dys 3 diet Continue renal vit/prostat. And ensure change to NePro With some K^ with ensure  7.LV thrombus-coumadin. Per primary  8.Acute CVA/ hx of LV thrombus  (from prior admit)- etiology unclear. Per primary 9.Disposition: SNF placement when bed available- having placement difficulties.  Ernest Haber, PA-C [facility] Beeper [phone] 09/19/2019,12:25 PM  LOS: 51 days   Labs: Basic Metabolic Panel: Recent Labs  Lab 09/17/19 0530 09/18/19 0518 09/19/19 0430  NA 131* 137 135  K 5.0 4.0 4.5  CL 92* 96* 91*  CO2 25 28 25   GLUCOSE 223* 91 175*  BUN 55* 32* 47*  CREATININE 7.87* 5.97* 7.63*  CALCIUM 8.4* 8.6* 8.9  PHOS 3.3 3.7 3.7   Liver Function Tests: Recent Labs  Lab 09/15/19 0741 09/17/19 0530 09/18/19 0518 09/18/19 1045 09/19/19 0430  AST 20  --   --  22  --   ALT 26  --   --  22  --   ALKPHOS 65  --   --  72  --   BILITOT 0.4  --   --  0.7  --   PROT 6.9  --   --  6.8  --   ALBUMIN  --    < > 2.5* 2.4* 2.4*   < > = values in this interval not displayed.   No results for input(s): LIPASE, AMYLASE in the last 168 hours. No results for input(s): AMMONIA in the last 168 hours. CBC: Recent Labs  Lab 09/15/19 0713 09/15/19 0713 09/17/19 0530 09/17/19 0530 09/17/19 1239 09/18/19 1045 [id number]  WBC DUPLICATE REQUEST  8.0   < >  7.3   < > 6.7 9.2 9.1  NEUTROABS 5.4  --   --   --   --  5.9  --   HGB DUPLICATE REQUEST  [id number]*   < > 9.8*   < > 9.5* 0000000* [id number]*  HCT DUPLICATE REQUEST  [id number]*   < > 31.1*   < > 30.9* [id number]* Q000111Q*  MCV DUPLICATE REQUEST  [id number]*  --  101.6*  --  102.7* 102.9* Q000111Q*  PLT DUPLICATE REQUEST  [id number]   < > 277   < > 276 289 278   < > = values in this interval not displayed.   Cardiac Enzymes: No results for input(s): CKTOTAL, CKMB, CKMBINDEX, TROPONINI in the last 168 hours. CBG: Recent Labs  Lab 09/18/19 0732 09/18/19 1114 09/18/19 1543 09/18/19 2051 09/19/19 1122  GLUCAP 77 146* 226* 287* 158*    Studies/Results: CT ABDOMEN PELVIS WO CONTRAST  Result Date: 09/17/2019 CLINICAL DATA:  69 year old male with abdominal pain and fever. History of COVID-19. EXAM: CT  CHEST, ABDOMEN AND PELVIS WITHOUT CONTRAST TECHNIQUE: Multidetector CT imaging of the chest, abdomen and pelvis was performed following the standard protocol without IV contrast. COMPARISON:  Chest radiograph dated 09/15/2019. FINDINGS: Evaluation of this exam is limited in the absence of intravenous contrast. CT CHEST FINDINGS Cardiovascular: There is no cardiomegaly. There is a high attenuating pericardial effusion adjacent to the left ventricle measuring approximately 13 mm in thickness. This may represent hemorrhagic fluid. Clinical correlation and further evaluation with echocardiogram recommended. 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 Three vessel coronary vascular calcification noted. There is atherosclerotic calcification of thoracic aorta. The central pulmonary arteries are grossly unremarkable. Mediastinum/Nodes: No hilar or mediastinal adenopathy. The esophagus and the thyroid gland are grossly unremarkable. No mediastinal fluid collection. 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 Bilateral hazy and streaky densities most concerning for atypical infection and in keeping with history of COVID-19. Clinical correlation is recommended. No lobar consolidation, pleural effusion, or pneumothorax. The central airways are patent. Musculoskeletal: No chest wall mass or suspicious bone lesions identified. CT ABDOMEN PELVIS FINDINGS No intra-abdominal free air or free fluid. Hepatobiliary: The liver is unremarkable. No intrahepatic biliary ductal dilatation. There are multiple small stones in the gallbladder. No pericholecystic fluid or evidence of acute cholecystitis by CT. Ultrasound may provide better evaluation. Pancreas: Unremarkable. No pancreatic ductal dilatation or surrounding inflammatory changes. Spleen: Normal in size without focal abnormality. Adrenals/Urinary Tract: The adrenal glands are unremarkable. Moderate bilateral renal parenchyma atrophy.  There is no hydronephrosis or nephrolithiasis on either side. Small bilateral renal hypodense lesions are suboptimally characterized on this noncontrast CT. The visualized ureters appear unremarkable. The urinary bladder is minimally distended. There is apparent diffuse thickening of the bladder wall which may be partly related to underdistention. Cystitis is not excluded. Correlation with urinalysis recommended. 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 There is no hydronephrosis or nephrolithiasis of the transplant kidney. No peritransplant fluid collection identified. The transplant kidney however appears slightly small. Stomach/Bowel: Moderate amount of stool noted throughout the colon. Small scattered colonic diverticula without active inflammatory changes noted. There is no bowel obstruction or active inflammation. Normal appendix. Vascular/Lymphatic: Advanced aortoiliac atherosclerotic disease. The IVC is unremarkable. No portal venous gas. There is no adenopathy. Reproductive: The prostate and seminal vesicles are grossly unremarkable. Other: There is a left psoas intramuscular hematoma measuring 5.8 x 5.0 cm in greatest axial dimensions and approximately 10 cm in craniocaudal length. Musculoskeletal: Degenerative  changes of the spine primarily at L4-L5. No acute osseous pathology. IMPRESSION: 1. Left psoas intramuscular hematoma. 2. Multifocal pneumonia, likely viral etiology including COVID-19. Clinical correlation is recommended. 3. 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 Cardiology consult and further evaluation with echocardiogram recommended. 4. Coronary vascular calcification and Aortic Atherosclerosis (ICD10-I70.0). 5. Cholelithiasis. 6. No hydronephrosis or nephrolithiasis. Right lower quadrant transplant kidney appears slightly atrophic. 7. Mildly thickened bladder wall. Correlation with urinalysis recommended to exclude  UTI. These results were called by telephone at the time of interpretation on 09/17/2019 at 9:39 pm to patient's nurse Ruthann Cancer, who verbally acknowledged these results. Electronically Signed   By: Anner Crete M.D.   On: 09/17/2019 21:50   CT CHEST WO CONTRAST  Result Date: 09/17/2019 CLINICAL DATA:  69 year old male with abdominal pain and fever. History of COVID-19. EXAM: CT CHEST, ABDOMEN AND PELVIS WITHOUT CONTRAST TECHNIQUE: Multidetector CT imaging of the chest, abdomen and pelvis was performed following the standard protocol without IV contrast. COMPARISON:  Chest radiograph dated 09/15/2019. FINDINGS: Evaluation of this exam is limited in the absence of intravenous contrast. CT CHEST FINDINGS Cardiovascular: There is no cardiomegaly. There is a high attenuating pericardial effusion adjacent to the left ventricle measuring approximately 13 mm in thickness. This may represent hemorrhagic fluid. Clinical correlation and further evaluation with echocardiogram recommended. 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 Three vessel coronary vascular calcification noted. There is atherosclerotic calcification of thoracic aorta. The central pulmonary arteries are grossly unremarkable. Mediastinum/Nodes: No hilar or mediastinal adenopathy. The esophagus and the thyroid gland are grossly unremarkable. No mediastinal fluid collection. 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 Bilateral hazy and streaky densities most concerning for atypical infection and in keeping with history of COVID-19. Clinical correlation is recommended. No lobar consolidation, pleural effusion, or pneumothorax. The central airways are patent. Musculoskeletal: No chest wall mass or suspicious bone lesions identified. CT ABDOMEN PELVIS FINDINGS No intra-abdominal free air or free fluid. Hepatobiliary: The liver is unremarkable. No intrahepatic biliary ductal dilatation. There are multiple small  stones in the gallbladder. No pericholecystic fluid or evidence of acute cholecystitis by CT. Ultrasound may provide better evaluation. Pancreas: Unremarkable. No pancreatic ductal dilatation or surrounding inflammatory changes. Spleen: Normal in size without focal abnormality. Adrenals/Urinary Tract: The adrenal glands are unremarkable. Moderate bilateral renal parenchyma atrophy. There is no hydronephrosis or nephrolithiasis on either side. Small bilateral renal hypodense lesions are suboptimally characterized on this noncontrast CT. The visualized ureters appear unremarkable. The urinary bladder is minimally distended. There is apparent diffuse thickening of the bladder wall which may be partly related to underdistention. Cystitis is not excluded. Correlation with urinalysis recommended. 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 There is no hydronephrosis or nephrolithiasis of the transplant kidney. No peritransplant fluid collection identified. The transplant kidney however appears slightly small. Stomach/Bowel: Moderate amount of stool noted throughout the colon. Small scattered colonic diverticula without active inflammatory changes noted. There is no bowel obstruction or active inflammation. Normal appendix. Vascular/Lymphatic: Advanced aortoiliac atherosclerotic disease. The IVC is unremarkable. No portal venous gas. There is no adenopathy. Reproductive: The prostate and seminal vesicles are grossly unremarkable. Other: There is a left psoas intramuscular hematoma measuring 5.8 x 5.0 cm in greatest axial dimensions and approximately 10 cm in craniocaudal length. Musculoskeletal: Degenerative changes of the spine primarily at L4-L5. No acute osseous pathology. IMPRESSION: 1. Left psoas intramuscular hematoma. 2. Multifocal pneumonia, likely viral etiology including  COVID-19. Clinical correlation is recommended. 3. 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 Cardiology consult and further evaluation with echocardiogram recommended. 4. Coronary vascular calcification and Aortic Atherosclerosis (ICD10-I70.0). 5. Cholelithiasis. 6. No hydronephrosis or nephrolithiasis. Right lower quadrant transplant kidney appears slightly atrophic. 7. Mildly thickened bladder wall. Correlation with urinalysis recommended to exclude UTI. These results were called by telephone at the time of interpretation on 09/17/2019 at 9:39 pm to patient's nurse Ruthann Cancer, who verbally acknowledged these results. Electronically Signed   By: Anner Crete M.D.   On: 09/17/2019 21:50   MR LUMBAR SPINE WO CONTRAST  Result Date: 09/18/2019 CLINICAL DATA:  Psoas abscess. EXAM: MRI LUMBAR SPINE WITHOUT CONTRAST TECHNIQUE: Multiplanar, multisequence MR imaging of the lumbar spine was performed. No intravenous contrast was administered. COMPARISON:  Correlation made with CT of the abdomen obtained September 17, 2019 FINDINGS: Segmentation:  Standard. Alignment:  Physiologic. Vertebrae: No fracture, evidence of discitis, or bone lesion. Prominent degenerative changes are seen in the L4-5 endplates. Conus medullaris and cauda equina: Conus extends to the L1 level. Conus and cauda equina appear normal. Paraspinal and other soft tissues: A large left psoas hematoma is noted extending from the level of the 3 no L5. The hematoma measures approximately 10.4 by 5.28 x 5.8 cm (cc, AP, AP), unchanged from prior CT. There is mild edema in the adjacent left iliac muscle. Small cysts are seen in the bilateral native kidneys. 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 Disc levels: L1-2, L2-3: No spinal canal or neural foraminal stenosis. L3-4: Shallow disc bulge with superimposed right foraminal disc protrusion and facet degenerative change resulting in moderate narrowing of the right neural foramen. There is no significant spinal canal stenosis. L4-5: Disc bulge with  superimposed right central disc protrusion no associated facet degenerative changes and ligamentum flavum hypertrophy resulting in mild spinal canal stenosis, mild right and mild to moderate left neural foraminal narrowing. L5-S1: Mild facet degenerative changes. No spinal canal or neural foraminal stenosis. IMPRESSION: 1. Large left psoas hematoma, similar in size compared to prior CT. 2. No evidence of discitis/osteomyelitis. 3. 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 Electronically Signed   By: Pedro Earls M.D.   On: 09/18/2019 15:30   ECHO TEE  Result Date: 09/18/2019    TRANSESOPHOGEAL ECHO REPORT   Patient Name:   Jonathan Nearhood Sr. Date of Exam: 09/18/2019 Medical Rec #:  [id number]        Height:       71.0 in Accession #:    [id number]       Weight:       144.6 lb Date of Birth:  1951-07-06        BSA:          1.84 m Patient Age:    69 years         BP:           121/65 mmHg Patient Gender: M                HR:           104 bpm. Exam Location:  Inpatient Procedure: Transesophageal Echo, Cardiac Doppler, Color Doppler and Saline            Contrast Bubble Study Indications:  bacteremia  History:        Patient has prior history of Echocardiogram examinations, most                 recent 08/01/2019. Stroke; Risk Factors:Hypertension, Diabetes                 and Former Smoker.  Sonographer:    Jannett Celestine RDCS (AE) Referring Phys: [id number] [place]: After discussion of the risks and benefits of a TEE, an informed consent was obtained from the patient. The transesophogeal probe was passed without difficulty through the esophogus of the patient. Sedation performed by different physician. The patient was monitored while under deep sedation. Anesthestetic sedation was provided intravenously by Anesthesiology: 150mg  of Propofol.  Image quality was excellent. The patient's vital signs; including heart rate, blood pressure, and oxygen saturation; remained stable throughout the procedure. The patient developed no complications during the procedure. IMPRESSIONS  1. Left ventricular ejection fraction, by estimation, is 30 to 35%. The left ventricle has severely decreased function. The left ventrical demonstrates regional wall motion abnormalities (see scoring diagram/findings for description). There is moderately increased left ventricular hypertrophy. Left ventricular diastolic function could not be evaluated. There is severe akinesis of the left ventricular, entire apical segment.  2. Right ventricular systolic function is normal. The right ventricular size is normal.  3. No left atrial/left atrial appendage thrombus was detected.  4. The mitral valve is normal in structure and function. Mild mitral valve regurgitation. No evidence of mitral stenosis.  5. The aortic valve is tricuspid. Aortic valve regurgitation is trivial . No aortic stenosis is present.  6. There is mild (Grade II) plaque involving the descending aorta.  7. Agitated saline contrast bubble study was negative, with no evidence of any interatrial shunt. Conclusion(s)/Recomendation(s): Normal biventricular function without evidence of hemodynamically significant valvular heart disease. No evidence of vegetation/infective endocarditis on this transesophageal echocardiogram. No LA/LAA thrombus identified. Negative bubble study for interatrial shunt. No intracardiac source of embolism detected on this on this transesophageal echocardiogram. FINDINGS  Left Ventricle: Left ventricular ejection fraction, by estimation, is 30 to 35%. The left ventricle has severely decreased function. The left ventricle demonstrates regional wall motion abnormalities. Severe akinesis of the left ventricular, entire apical segment. There is moderately increased. There is moderately increased left  ventricular hypertrophy. Left ventricular diastolic function could not be evaluated. Right Ventricle: The right ventricular size is normal. No increase in right ventricular wall thickness. Right ventricular systolic function is normal. Left Atrium: Left atrial size was not assessed. No left atrial/left atrial appendage thrombus was detected. Right Atrium: Right atrial size was not assessed. Pericardium: There is no evidence of pericardial effusion. Mitral Valve: The mitral valve is normal in structure and function. Mild mitral valve regurgitation. No evidence of mitral valve stenosis. Tricuspid Valve: The tricuspid valve is normal in structure. Tricuspid valve regurgitation is trivial. There is no evidence of tricuspid valve vegetation. Aortic Valve: The aortic valve is tricuspid. Aortic valve regurgitation is trivial. No aortic stenosis is present. There is no evidence of aortic valve vegetation. Pulmonic Valve: The pulmonic valve was normal in structure. Pulmonic valve regurgitation is trivial. There is no evidence of pulmonic valve vegetation. Aorta: The aortic root and ascending aorta are structurally normal, with no evidence of dilitation. There is mild (Grade II) plaque involving the descending aorta. IAS/Shunts: No atrial level shunt detected by color flow Doppler. Agitated saline contrast was given intravenously to evaluate for intracardiac shunting. Agitated saline  contrast bubble study was negative, with no evidence of any interatrial shunt. There  is no evidence of a patent foramen ovale. There is no evidence of an atrial septal defect. Buford Dresser MD Electronically signed by Buford Dresser MD Signature Date/Time: 09/18/2019/3:25:03 PM    Final    Medications: . sodium chloride 10 mL/hr at 09/14/19 1008   .  stroke: mapping our early stages of recovery book   Does not apply Once  . allopurinol  100 mg Oral Daily  . aspirin  81 mg Oral Daily  . atorvastatin  40 mg Oral Daily  .  Chlorhexidine Gluconate Cloth  6 each Topical Q0600  . darbepoetin (ARANESP) injection - DIALYSIS  150 mcg Intravenous Q Tue-HD  . feeding supplement (NEPRO CARB STEADY)  237 mL Oral TID BM  . feeding supplement (PRO-STAT SUGAR FREE 64)  30 mL Oral BID  . insulin aspart  0-15 Units Subcutaneous TID WC  . insulin aspart  0-5 Units Subcutaneous QHS  . insulin aspart  3 Units Subcutaneous TID WC  . metoCLOPramide  5 mg Oral BID WC  . metoprolol succinate  50 mg Oral Daily  . multivitamin  1 tablet Oral QHS  . pantoprazole  40 mg Oral Daily  . predniSONE  5 mg Oral Q breakfast  . sodium chloride flush  3 mL Intravenous Q12H  . warfarin  4 mg Oral ONCE-1800  . Warfarin - Pharmacist Dosing Inpatient   Does not apply [phone]

## 2019-09-20 LAB — CBC WITH DIFFERENTIAL/PLATELET
Abs Immature Granulocytes: 0.1 10*3/uL — ABNORMAL HIGH (ref 0.00–0.07)
Basophils Absolute: 0 10*3/uL (ref 0.0–0.1)
Basophils Relative: 0 %
Eosinophils Absolute: 0.1 10*3/uL (ref 0.0–0.5)
Eosinophils Relative: 1 %
HCT: 34.9 % — ABNORMAL LOW (ref 39.0–52.0)
Hemoglobin: 10.7 g/dL — ABNORMAL LOW (ref 13.0–17.0)
Immature Granulocytes: 1 %
Lymphocytes Relative: 22 %
Lymphs Abs: 2 10*3/uL (ref 0.7–4.0)
MCH: 31.6 pg (ref 26.0–34.0)
MCHC: 30.7 g/dL (ref 30.0–36.0)
MCV: 102.9 fL — ABNORMAL HIGH (ref 80.0–100.0)
Monocytes Absolute: 1 10*3/uL (ref 0.1–1.0)
Monocytes Relative: 11 %
Neutro Abs: 6 10*3/uL (ref 1.7–7.7)
Neutrophils Relative %: 65 %
Platelets: 268 10*3/uL (ref 150–400)
RBC: 3.39 MIL/uL — ABNORMAL LOW (ref 4.22–5.81)
RDW: 17.8 % — ABNORMAL HIGH (ref 11.5–15.5)
WBC: 9.2 10*3/uL (ref 4.0–10.5)
nRBC: 0.2 % (ref 0.0–0.2)

## 2019-09-20 LAB — COMPREHENSIVE METABOLIC PANEL
ALT: 21 U/L (ref 0–44)
AST: 19 U/L (ref 15–41)
Albumin: 2.5 g/dL — ABNORMAL LOW (ref 3.5–5.0)
Alkaline Phosphatase: 76 U/L (ref 38–126)
Anion gap: 14 (ref 5–15)
BUN: 30 mg/dL — ABNORMAL HIGH (ref 8–23)
CO2: 28 mmol/L (ref 22–32)
Calcium: 8.8 mg/dL — ABNORMAL LOW (ref 8.9–10.3)
Chloride: 94 mmol/L — ABNORMAL LOW (ref 98–111)
Creatinine, Ser: 5.43 mg/dL — ABNORMAL HIGH (ref 0.61–1.24)
GFR calc Af Amer: 12 mL/min — ABNORMAL LOW (ref >60)
GFR calc non Af Amer: 10 mL/min — ABNORMAL LOW (ref >60)
Glucose, Bld: 223 mg/dL — ABNORMAL HIGH (ref 70–99)
Potassium: 3.9 mmol/L (ref 3.5–5.1)
Sodium: 136 mmol/L (ref 135–145)
Total Bilirubin: 0.5 mg/dL (ref 0.3–1.2)
Total Protein: 6.6 g/dL (ref 6.5–8.1)

## 2019-09-20 LAB — GLUCOSE, CAPILLARY
Glucose-Capillary: 156 mg/dL — ABNORMAL HIGH (ref 70–99)
Glucose-Capillary: 193 mg/dL — ABNORMAL HIGH (ref 70–99)
Glucose-Capillary: 142 mg/dL — ABNORMAL HIGH (ref 70–99)
Glucose-Capillary: 114 mg/dL — ABNORMAL HIGH (ref 70–99)

## 2019-09-20 LAB — CULTURE, BLOOD (SINGLE): Culture: NO GROWTH

## 2019-09-20 LAB — MAGNESIUM: Magnesium: 2 mg/dL (ref 1.7–2.4)

## 2019-09-20 LAB — PHOSPHORUS: Phosphorus: 3.4 mg/dL (ref 2.5–4.6)

## 2019-09-20 LAB — PROTIME-INR
INR: 2.3 — ABNORMAL HIGH (ref 0.8–1.2)
Prothrombin Time: 25.2 seconds — ABNORMAL HIGH (ref 11.4–15.2)

## 2019-09-20 MED ORDER — WARFARIN SODIUM 4 MG PO TABS
4.0000 mg | ORAL_TABLET | Freq: Once | ORAL | Status: DC
  Filled 2019-09-20: qty 1

## 2019-09-20 NOTE — Progress Notes (Signed)
ANTICOAGULATION CONSULT NOTE - Follow Up Consult  Pharmacy Consult for Coumadin Indication: LV thrombus, CVA  No Known Allergies  Patient Measurements: Height: 5\' 11"  (180.3 cm) Weight: 140 lb 14 oz (63.9 kg) IBW/kg (Calculated) : 75.3  Vital Signs: Temp: 97.6 F (36.4 C) (02/12 0528) Temp Source: Oral (02/12 0528) BP: 130/79 (02/12 0528) Pulse Rate: 81 (02/12 0528)  Labs: Recent Labs    09/17/19 1239 09/18/19 0518 09/18/19 1045 09/18/19 1045 09/19/19 0430 09/19/19 0658 09/20/19 0333  HGB   < >  --  10.7*   < >  --  10.1* 10.7*  HCT   < >  --  35.2*  --   --  33.5* 34.9*  PLT   < >  --  289  --   --  278 268  LABPROT  --  23.6*  --   --  26.2*  --  25.2*  INR  --  2.1*  --   --  2.4*  --  2.3*  CREATININE  --  5.97*  --   --  7.63*  --  5.43*   < > = values in this interval not displayed.    Estimated Creatinine Clearance: 12.1 mL/min (A) (by C-G formula based on SCr of 5.43 mg/dL (H)).   Assessment: 33 yoM previously on apixaban for LV thrombus, changed to warfarin this admission. Pt noted to have recent stroke on MRI 12/23.   INR has been labile this admit - warfarin held x2 doses 2/2-2/3 for high INR, then had low INR of 1.8 on 2/7. INR remains therapeutic today at 2.3. CBC stable.  CT abdomen pelvis from 2/9 shows L psoas IM hematoma and possible hemopericardium, still thought to be hematoma on MRI lumbar spine. Per discussion with Dr. Alfredia Ferguson, ok to continue warfarin for now.  Goal of Therapy:  INR 2-3 Monitor platelets by anticoagulation protocol: Yes   Plan:  Warfarin 4mg  PO x 1 dose tonight Monitor daily INR, CBC, s/sx bleeding  Antonietta Jewel, PharmD, BCCCP Clinical Pharmacist  Phone: [phone]  Please check AMION for all [place] phone numbers After 10:00 PM, call [place] [phone] 09/20/2019 8:48 AM

## 2019-09-20 NOTE — Progress Notes (Addendum)
PROGRESS NOTE    Jonathan Leddon Sr.  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 MRI brain showed small infarct in the left pons left occiput. Neurology saw the patient and felt this was secondary to left ventricular thrombus ( 2D echo did not show any clots or PFO); he was started on heparin. He was diagnosed with Covid infection on 07/24/2019 and was treated for Infection at that time. Patient has recurrently tested positive -probably positivity from his index infection in December but now has tested Negative Twice.  Hospital course complicated by fever spikes on 1/30, 2/1,2/3 , 2/5 and 2/6 (102.52F) which has now resolved.  Infectious diseases evaluated and ordered MRI and recommending monitoring off of antibiotics.  Hospitalization is further been complicated by left psoas hematoma.  TEE was done on 2/10 and showed no evidence of any cardiac embolism with no evidence of any clot.  Orthopedics evaluated his hematoma and recommending no current intervention. PT OT recommending SNF and he can return to his regular dialysis center now that he is tested negative twice for COVID-19 disease.  Patient appears stable for discharge from a medical perspective though he was a little confused this morning and only oriented to himself and agitated when I told him that the year was not 2003 and that it was 2021 and he told me to leave him alone then and stated that he did not like me.   Assessment & Plan:   Principal Problem:   FUO (fever of unknown origin) Active Problems:   Chronic hepatitis C without hepatic coma ([name])   Diabetes mellitus type 2, uncomplicated ([name])   History of coronary  artery disease   Hypertension   ESRD (end stage renal disease) on dialysis ([name])   Acute respiratory failure due to COVID-19 [facility])   Acute respiratory failure with hypoxia ([name])   Cerebral thrombosis with cerebral infarction   Cerebral embolism with cerebral infarction  Acute left pons and left occipital strokes -Etiology unclear but though to be secondary to Covid induced hypercoaguablilty versus LV thrombus. -Neurology saw the patient and felt this was secondary to left ventricular thrombus;  -He was started on heparin which had been changed to Eliquis and now Coumadin and his INR is therapeutic at 2.3 -Continue ASA, atorvastatin 40 mg p.o. daily, Eliquis changed to coumadin;  -Coumadin per Pharmacy Dosing  -SNF placement recommended by PT and currently there is no facility for the patient to go to but he is now tested negative twice for COVID-19 disease and can return to his regular dialysis center; social worker consulted for assistance with placement and are looking to [place] facility -Will place on Delirium Precautions -Only was alert and oriented x1 today and became extremely agitated and upset when I asked him orientation questions and told him the correct responses  Fevers, improved -Secondary to aspiration vs acute infections but now improved  -.Urine culture NGTD. However, patient received Rocephin x 5 daysfor presumed UTI/aspiration during [facility].  -Blood cultures 1/30, 2/7 after fevers, negative to date. 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  -HD  catheter removed 1/26 and site looks okay with no redness but has mild draiange.  -Given transplant h/o and concern for recurrent fevers, consulted ID.  -Consult speech to r/o ongoing aspiration.  -CT chest /abd/pelvis ordered (changed to WO  contrast as radiology woundnt give contrast if patient been on dialysis for <79months) per d/w Dr Tommy Medal and showed "Left psoas intramuscular hematoma. Multifocal pneumonia, likely viral etiology including COVID-19. Clinical correlation is recommended. 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 Cardiology consult and further evaluation with echocardiogram recommended. Coronary vascular calcification and Aortic Atherosclerosis. Cholelithiasis. No hydronephrosis or nephrolithiasis. Right lower quadrant transplant kidney appears slightly atrophic. Mildly thickened bladder wall. Correlation with urinalysis recommended to exclude UTI." -MRI ordered due concern for Abscesses and showed "Large left psoas hematoma, similar in size compared to prior CT. No evidence of discitis/osteomyelitis. Degenerative disc disease at L3-4 and L4-5 as described above, worst at L4-5 where there is mild spinal canal stenosis, mild right and mild to moderate left neural foraminal narrowing." -Infectious disease evaluated and recommends watching off of antibiotics for now he has been afebrile for greater than 72 hours.  Large Left Psoas Hematoma -Continue with anticoagulation this point given his left ventricular thrombus and need for anticoagulation -hemoglobin/hematocrit remains relatively stable -Discussed with Dr. Percell Miller of orthopedics who will formally see the patient in the morning and recommends currently no indication for drainage at this point they currently would not recommend any intervention at this time given that is unlikely to be infected given his lack of symptomatology   Recent COVID-19 pneumonia -Initially tested positive in December 2020 and completed appropriate treatment.  -No longer infectious and off of precautions at this point.  -Assumed that patient could continue to test positive for 90 days from his initial positive test.  -SNF wants 2  Negative Tests prior to D/C -SpO2: 97 % O2 Flow Rate (L/min): 2 L/min  Recent Labs    09/18/19 1418  CRP 5.5*    Lab Results  Component Value Date   SARSCOV2NAA NEGATIVE 09/19/2019   SARSCOV2NAA NEGATIVE 09/16/2019   SARSCOV2NAA POSITIVE (A) 08/22/2019   SARSCOV2NAA POSITIVE (A) 07/24/2019  -Continue monitor chest x-ray intermittently but currently is improved from a respiratory standpoint -Continue Xopenex 2 puffs elations every 8 hours as needed for wheezing  ?LV thrombus -Not seen on echo, not sure if TEE deferred due to [place].  -Coumadin per pharmacy -TEE done and showed no hemipericardium and there is no cardiac source of embolism noted  ESRD/HD TTSa -Nephrology following. Per renal patient can dialyze at [facility] until COVID tests negative (Tested Negative on 09/16/19 and again yesterday 09/18/2019) -Patient's BUN/creatinine went from 55/7.87 -> 32/5.97 -> 47/7.63 -> 30/5.43 -Continue with renal/carb modified diet with 1200 mL fluid restriction -Nephrology continuing darbepoetin alpha 150 mcg every Tuesday for hemodialysis -Continue with Rena-Vit -Patient underwent Dialysis yesterday   Prior cadaveric renal transplant -Continue prednisone. Fever w/u as above and his fevers have improved   Hyperglycemia, Pre-Diabetes -Continue moderate NovoLog sliding scale insulin before meals and at bedtime -CBGs ranging from 156-193  HTN -Currently his losartan 25 g p.o. daily has been held -He is on metoprolol succinate 50 mg p.o. daily but this was held and will resume -Change to monitor blood pressures very carefully and Last BP was 119/68  Macrocytic Anemia -Patient's Hb/Hct is now 10.7/34.9 and relatively stable  -MCV was 102.9 -Continue to Monitor for S/Sx of Bleeding; Currently no Active bleeding  noted -Repeat CBC in AM   DVT prophylaxis: Anticoagulated with  Coumadin and now INR Therapeutic  Code Status: FULL CODE Family Communication: No family present at  bedside  Disposition Plan: Patient presented from home and currently requires SNF; He has stopped Spiking temperatures and has Tested Negative for COVID x2. Discharge barriers due to finding SNF placement that can transport patient to Dialysis and Social work looking into [place]:   Cardiology  Infectious Diseases   Nephrology   Orthopedic Surgery   Procedures:  ECHOCARDIOGRAM 08/01/2019 IMPRESSIONS    1. Left ventricular ejection fraction, by visual estimation, is 30 to  35%. The left ventricle has moderate to severely decreased function. There  is no left ventricular hypertrophy.  2. Severe dyskinesis of the left ventricular, entire apical segment.  3. Apical left ventricular aneurysm.  4. Definity contrast agent was given IV to delineate the left ventricular  endocardial borders.  5. No intracardiac thrombi or masses were visualized.  6. Left ventricular diastolic parameters are consistent with Grade I  diastolic dysfunction (impaired relaxation).  7. Global right ventricle has normal systolic function.The right  ventricular size is normal. No increase in right ventricular wall  thickness.  8. Left atrial size was normal.  9. Right atrial size was normal.  10. The mitral valve is normal in structure. Mild mitral valve  regurgitation. No evidence of mitral stenosis.  11. The tricuspid valve is normal in structure.  12. The aortic valve is normal in structure. Aortic valve regurgitation is  trivial. No evidence of aortic valve sclerosis or stenosis.  13. The pulmonic valve was normal in structure. Pulmonic valve  regurgitation is not visualized.  14. The inferior vena cava is normal in size with greater than 50%  respiratory variability, suggesting right atrial pressure of 3 mmHg.   FINDINGS  Left Ventricle: Left ventricular ejection fraction, by visual estimation,  is 30 to 35%. The left ventricle has moderate to severely decreased    function. Severe dyskinesis of the left ventricular, entire apical  segment. Definity contrast agent was given  IV to delineate the left ventricular endocardial borders. The left  ventricle demonstrates regional wall motion abnormalities. There is no  left ventricular hypertrophy. Left ventricular diastolic parameters are  consistent with Grade I diastolic  dysfunction (impaired relaxation). Normal left atrial pressure. Apical  aneurysm can be seen.   Right Ventricle: The right ventricular size is normal. No increase in  right ventricular wall thickness. Global RV systolic function is has  normal systolic function.   Left Atrium: Left atrial size was normal in size.   Right Atrium: Right atrial size was normal in size   Pericardium: There is no evidence of pericardial effusion.   Mitral Valve: The mitral valve is normal in structure. Mild mitral valve  regurgitation. No evidence of mitral valve stenosis by observation.   Tricuspid Valve: The tricuspid valve is normal in structure. Tricuspid  valve regurgitation is not demonstrated.   Aortic Valve: The aortic valve is normal in structure. Aortic valve  regurgitation is trivial. The aortic valve is structurally normal, with no  evidence of sclerosis or stenosis.   Pulmonic Valve: The pulmonic valve was normal in structure. Pulmonic valve  regurgitation is not visualized. Pulmonic regurgitation is not visualized.   Aorta: The aortic root, ascending aorta and aortic arch are all  structurally normal, with no evidence of dilitation or obstruction.   Venous: The inferior vena cava is normal in size with greater  than 50%  respiratory variability, suggesting right atrial pressure of 3 mmHg.   IAS/Shunts: No atrial level shunt detected by color flow Doppler. There is  no evidence of a patent foramen ovale. No ventricular septal defect is  seen or detected. There is no evidence of an atrial septal defect.   Additional Comments: No  intracardiac thrombi or masses were visualized.      LV Volumes (MOD)  LV area d, A2C:  44.00 cm Diastology  LV area d, A4C:  43.50 cm LV e' lateral:  5.43 cm/s  LV area s, A2C:  34.10 cm LV E/e' lateral: 10.6  LV area s, A4C:  34.50 cm LV e' medial:  4.05 cm/s  LV major d, A2C:  9.28 cm  LV E/e' medial: 14.2  LV major d, A4C:  9.21 cm  LV major s, A2C:  8.39 cm  LV major s, A4C:  8.13 cm  LV vol d, MOD A2C: 173.0 ml  LV vol d, MOD A4C: 169.0 ml  LV vol s, MOD A2C: 113.0 ml  LV vol s, MOD A4C: 117.0 ml  LV SV MOD A2C:   60.0 ml  LV SV MOD A4C:   169.0 ml  LV SV MOD BP:   54.4 ml   RIGHT VENTRICLE  RV S prime:   11.40 cm/s  TAPSE (M-mode): 2.0 cm   LEFT ATRIUM       Index    RIGHT ATRIUM      Index  LA Vol (A2C):  66.6 ml 33.83 ml/m RA Area:   14.30 cm  LA Vol (A4C):  55.4 ml 28.14 ml/m RA Volume:  34.50 ml 17.52 ml/m  LA Biplane Vol: 62.2 ml 31.59 ml/m  AORTIC VALVE  LVOT Vmax:  91.40 cm/s  LVOT Vmean: 58.400 cm/s  LVOT VTI:  0.221 m   MITRAL VALVE  MV Area (PHT): 3.21 cm       SHUNTS  MV PHT:    68.44 msec      Systemic VTI: 0.22 m  MV Decel Time: 236 msec  MV E velocity: 57.40 cm/s 103 cm/s  MV A velocity: 82.30 cm/s 70.3 cm/s  MV E/A ratio: 0.70    1.5    TEE LEFT VENTRICLE: EF = 30-35%. At least moderate LVH. Apical hypokinesis with aneurysm  RIGHT VENTRICLE: Normal size and function.   LEFT ATRIUM: No thrombus/mass.  LEFT ATRIAL APPENDAGE: No thrombus/mass.   RIGHT ATRIUM: No thrombus/mass.  AORTIC VALVE:  Trileaflet. Trivial regurgitation. No vegetation.  MITRAL VALVE:    Normal structure. Mild regurgitation. No vegetation.  TRICUSPID VALVE: Normal structure. Trivial regurgitation. No vegetation.  PULMONIC VALVE: Grossly normal structure. Trivial regurgitation. No apparent vegetation.  INTERATRIAL SEPTUM: No PFO or ASD seen by color Doppler. Negative bubble  study for right to left shunt.  PERICARDIUM: No effusion noted.  DESCENDING AORTA: Mild diffuse plaque seen   CONCLUSION: no cardiac source of embolism   Antimicrobials:  Anti-infectives (From admission, onward)   Start     Dose/Rate Route Frequency Ordered Stop   09/11/19 0830  cefTRIAXone (ROCEPHIN) 1 g in sodium chloride 0.9 % 100 mL IVPB     1 g 200 mL/hr over 30 Minutes Intravenous Daily 09/11/19 0726 09/15/19 0959   07/31/19 1000  remdesivir 100 mg in sodium chloride 0.9 % 100 mL IVPB     100 mg 200 mL/hr over 30 Minutes Intravenous Daily 07/30/19 2217 08/04/19 0748   07/31/19 0800  ceFEPIme (MAXIPIME) 1 g in  sodium chloride 0.9 % 100 mL IVPB  Status:  Discontinued     1 g 200 mL/hr over 30 Minutes Intravenous Every 24 hours 07/31/19 0748 07/31/19 0816   07/31/19 0800  vancomycin (VANCOREADY) IVPB 1500 mg/300 mL  Status:  Discontinued     1,500 mg 150 mL/hr over 120 Minutes Intravenous  Once 07/31/19 0748 07/31/19 0816   07/31/19 0748  vancomycin variable dose per unstable renal function (pharmacist dosing)  Status:  Discontinued      Does not apply See admin instructions 07/31/19 0748 07/31/19 0816   07/30/19 2230  remdesivir 200 mg in sodium chloride 0.9% 250 mL IVPB     200 mg 580 mL/hr over 30 Minutes Intravenous Once 07/30/19 2217 07/31/19 0045     Subjective: Seen and examined at bedside and he stated that he had no complaints and denied any pain. When I went to examine him he raised both his hands and held them outstretched.  I asked him orientation questions and he was able to tell me his name when asked him what month it was he stated as a "third month" and I asked him what year and he states "2003."  He then became agitated and started getting mad at me when I told him it is actually 2021 I told me to leave him alone and told me "I don't like you."  Per nursing patient has been doing this for the last few days.  No other concerns or complaints at this time and  patient is stable from a medical perspective to be discharged  Objective: Vitals:   09/19/19 1607 09/19/19 2100 09/20/19 0528 09/20/19 0900  BP: 112/64 (!) 136/100 130/79 127/71  Pulse: 89 92 81 82  Resp: 18 16  18   Temp: 98 F (36.7 C) 98.4 F (36.9 C) 97.6 F (36.4 C) 97.8 F (36.6 C)  TempSrc: Oral Oral Oral   SpO2: 100% 98% 98% 97%  Weight:  63.9 kg    Height:        Intake/Output Summary (Last 24 hours) at 09/20/2019 1234 Last data filed at 09/20/2019 0900 Gross per 24 hour  Intake 360 ml  Output 250 ml  Net 110 ml   Filed Weights   09/19/19 0643 09/19/19 1028 09/19/19 2100  Weight: 68.1 kg 65.2 kg 63.9 kg   Examination: Physical Exam:  Constitutional: WN/WD older appearing than his stated age, African-American male currently in no acute distress but became agitated and upset when I asked him orientation questions Eyes: Lids and conjunctivae normal, sclerae anicteric  ENMT: External Ears, Nose appear normal. Grossly normal hearing.  Neck: Appears normal, supple, no cervical masses, normal ROM, no appreciable thyromegaly; no JVD Respiratory: Diminished to auscultation bilaterally, no wheezing, rales, rhonchi or crackles. Normal respiratory effort and patient is not tachypenic. No accessory muscle use. Unlabored breathing   Cardiovascular: RRR, no murmurs / rubs / gallops. S1 and S2 auscultated.  Abdomen: Soft, non-tender, non-distended. Bowel sounds positive.  GU: Deferred. Musculoskeletal: No clubbing / cyanosis of digits/nails. No joint deformity upper and lower extremities.  Skin: No rashes, lesions, ulcers on a limited skin evaluation. No induration; Warm and dry.  Neurologic: CN 2-12 grossly intact with no focal deficits.  Psychiatric: Impaired judgment and insight. Alert and oriented x 1. Agitated mood and appropriate affect.   Data Reviewed: I have personally reviewed following labs and imaging studies  CBC: Recent Labs  Lab 09/15/19 0713 09/15/19 0713  09/17/19 0530 09/17/19 1239 09/18/19 1045 09/19/19 [id number]  [id number]  WBC DUPLICATE REQUEST  8.0   < > 7.3 6.7 9.2 9.1 9.2  NEUTROABS 5.4  --   --   --  5.9  --  6.0  HGB DUPLICATE REQUEST  [id number]*   < > 9.8* 9.5* 10.7* [id number]* 0000000*  HCT DUPLICATE REQUEST  [id number]*   < > 31.1* 30.9* 35.2* [id number]* [id number]*  MCV DUPLICATE REQUEST  [id number]*   < > 101.6* 102.7* 102.9* 102.4* [id number]*  PLT DUPLICATE REQUEST  [id number]   < > 277 276 289 278 268   < > = values in this interval not displayed.   Basic Metabolic Panel: Recent Labs  Lab 09/15/19 0713 09/15/19 0741 09/17/19 0530 09/18/19 0518 09/18/19 1045 09/19/19 0430 09/20/19 0333  NA 136  --  131* 137  --  135 136  K 4.3  --  5.0 4.0  --  4.5 3.9  CL 96*  --  92* 96*  --  91* 94*  CO2 26  --  25 28  --  25 28  GLUCOSE 99  --  223* 91  --  175* 223*  BUN 21  --  55* 32*  --  47* 30*  CREATININE 5.12*  --  7.87* 5.97*  --  7.63* 5.43*  CALCIUM 8.3*  --  8.4* 8.6*  --  8.9 8.8*  MG  --  1.9  --   --  2.1  --  2.0  PHOS 4.7*  --  3.3 3.7  --  3.7 3.4   GFR: Estimated Creatinine Clearance: 12.1 mL/min (A) (by C-G formula based on SCr of 5.43 mg/dL (H)). Liver Function Tests: Recent Labs  Lab 09/15/19 0713 09/15/19 0741 09/17/19 0530 09/18/19 0518 09/18/19 1045 09/19/19 0430 09/20/19 0333  AST  --  20  --   --  22  --  19  ALT  --  26  --   --  22  --  21  ALKPHOS  --  65  --   --  72  --  76  BILITOT  --  0.4  --   --  0.7  --  0.5  PROT  --  6.9  --   --  6.8  --  6.6  ALBUMIN   < >  --  2.2* 2.5* 2.4* 2.4* 2.5*   < > = values in this interval not displayed.   No results for input(s): LIPASE, AMYLASE in the last 168 hours. No results for input(s): AMMONIA in the last 168 hours. Coagulation Profile: Recent Labs  Lab 09/15/19 0713 09/17/19 0530 09/18/19 0518 09/19/19 0430 09/20/19 0333  INR 1.8* 2.3* 2.1* 2.4* 2.3*   Cardiac Enzymes: No results for input(s): CKTOTAL, CKMB, CKMBINDEX, TROPONINI in the last 168 hours. BNP (last  3 results) No results for input(s): PROBNP in the last 8760 hours. HbA1C: No results for input(s): HGBA1C in the last 72 hours. CBG: Recent Labs  Lab 09/19/19 1122 09/19/19 1606 09/19/19 2138 09/20/19 0655 09/20/19 1113  GLUCAP 158* 193* 253* 156* 193*   Lipid Profile: No results for input(s): CHOL, HDL, LDLCALC, TRIG, CHOLHDL, LDLDIRECT in the last 72 hours. Thyroid Function Tests: No results for input(s): TSH, T4TOTAL, FREET4, T3FREE, THYROIDAB in the last 72 hours. Anemia Panel: No results for input(s): VITAMINB12, FOLATE, FERRITIN, TIBC, IRON, RETICCTPCT in the last 72 hours. Sepsis Labs: Recent Labs  Lab 09/15/19 0741 09/15/19 1016 09/15/19 1326  PROCALCITON 1.49  --   --   LATICACIDVEN  --  2.0* 1.5    Recent Results (from the past 240 hour(s))  Culture, blood (single)     Status: None   Collection Time: 09/15/19 10:16 AM   Specimen: BLOOD LEFT HAND  Result Value Ref Range Status   Specimen Description BLOOD LEFT HAND  Final   Special Requests   Final    AEROBIC BOTTLE ONLY Blood Culture results may not be optimal due to an inadequate volume of blood received in culture bottles   Culture   Final    NO GROWTH 5 DAYS Performed at [facility], [place] [address], [place], [place] [address]    Report Status 09/20/2019 FINAL  Final  SARS CORONAVIRUS 2 (TAT 6-24 HRS) Nasopharyngeal Nasopharyngeal Swab     Status: None   Collection Time: 09/16/19  1:55 PM   Specimen: Nasopharyngeal Swab  Result Value Ref Range Status   SARS Coronavirus 2 NEGATIVE NEGATIVE Final    Comment: (NOTE) SARS-CoV-2 target nucleic acids are NOT DETECTED. The SARS-CoV-2 RNA is generally detectable in upper and lower respiratory specimens during the acute phase of infection. Negative results do not preclude SARS-CoV-2 infection, do not rule out co-infections with other pathogens, and should not be used as the sole basis for treatment or other patient management decisions. Negative  results must be combined with clinical observations, patient history, and epidemiological information. The expected result is Negative. Fact Sheet for Patients: SugarRoll.be Fact Sheet for Healthcare Providers: https://www.woods-mathews.com/ This test is not yet approved or cleared by the [place] FDA and  has been authorized for detection and/or diagnosis of SARS-CoV-2 by FDA under an Emergency Use Authorization (EUA). This EUA will remain  in effect (meaning this test can be used) for the duration of the COVID-19 declaration under Section 56 4(b)(1) of the Act, 21 U.S.C. section 360bbb-3(b)(1), unless the authorization is terminated or revoked sooner. Performed at [facility], [place] [address] [place], [place] [address]   SARS CORONAVIRUS 2 (TAT 6-24 HRS)     Status: None   Collection Time: 09/19/19  4:00 AM  Result Value Ref Range Status   SARS Coronavirus 2 NEGATIVE NEGATIVE Final    Comment: (NOTE) SARS-CoV-2 target nucleic acids are NOT DETECTED. The SARS-CoV-2 RNA is generally detectable in upper and lower respiratory specimens during the acute phase of infection. Negative results do not preclude SARS-CoV-2 infection, do not rule out co-infections with other pathogens, and should not be used as the sole basis for treatment or other patient management decisions. Negative results must be combined with clinical observations, patient history, and epidemiological information. The expected result is Negative. Fact Sheet for Patients: SugarRoll.be Fact Sheet for Healthcare Providers: https://www.woods-mathews.com/ This test is not yet approved or cleared by the [place] FDA and  has been authorized for detection and/or diagnosis of SARS-CoV-2 by FDA under an Emergency Use Authorization (EUA). This EUA will remain  in effect (meaning this test can be used) for the duration of  the COVID-19 declaration under Section 56 4(b)(1) of the Act, 21 U.S.C. section 360bbb-3(b)(1), unless the authorization is terminated or revoked sooner. Performed at [facility], [place] [address] [place], [place] [address]      RN Pressure Injury Documentation:     Estimated body mass index is 19.65 kg/m as calculated from the following:   Height as of this encounter: 5\' 11"  (1.803 m).   Weight as of this encounter: 63.9 kg.  Malnutrition Type:  Nutrition Problem: Increased nutrient needs Etiology:  chronic illness(ESRD on HD)   Malnutrition Characteristics:  Signs/Symptoms: estimated needs   Nutrition Interventions:  Interventions: Ensure Enlive (each supplement provides 350kcal and 20 grams of protein), MVI   Radiology Studies: MR LUMBAR SPINE WO CONTRAST  Result Date: 09/18/2019 CLINICAL DATA:  Psoas abscess. EXAM: MRI LUMBAR SPINE WITHOUT CONTRAST TECHNIQUE: Multiplanar, multisequence MR imaging of the lumbar spine was performed. No intravenous contrast was administered. COMPARISON:  Correlation made with CT of the abdomen obtained September 17, 2019 FINDINGS: Segmentation:  Standard. Alignment:  Physiologic. Vertebrae: No fracture, evidence of discitis, or bone lesion. Prominent degenerative changes are seen in the L4-5 endplates. Conus medullaris and cauda equina: Conus extends to the L1 level. Conus and cauda equina appear normal. Paraspinal and other soft tissues: A large left psoas hematoma is noted extending from the level of the 3 no L5. The hematoma measures approximately 10.4 by 5.28 x 5.8 cm (cc, AP, AP), unchanged from prior CT. There is mild edema in the adjacent left iliac muscle. Small cysts are seen in the bilateral native kidneys. 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 Disc levels: L1-2, L2-3: No spinal canal or neural foraminal stenosis. L3-4: Shallow disc bulge with superimposed right foraminal disc protrusion and facet degenerative  change resulting in moderate narrowing of the right neural foramen. There is no significant spinal canal stenosis. L4-5: Disc bulge with superimposed right central disc protrusion no associated facet degenerative changes and ligamentum flavum hypertrophy resulting in mild spinal canal stenosis, mild right and mild to moderate left neural foraminal narrowing. L5-S1: Mild facet degenerative changes. No spinal canal or neural foraminal stenosis. IMPRESSION: 1. Large left psoas hematoma, similar in size compared to prior CT. 2. No evidence of discitis/osteomyelitis. 3. 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 Electronically Signed   By: Pedro Earls M.D.   On: 09/18/2019 15:30   Scheduled Meds: .  stroke: mapping our early stages of recovery book   Does not apply Once  . allopurinol  100 mg Oral Daily  . aspirin  81 mg Oral Daily  . atorvastatin  40 mg Oral Daily  . Chlorhexidine Gluconate Cloth  6 each Topical Q0600  . darbepoetin (ARANESP) injection - DIALYSIS  150 mcg Intravenous Q Tue-HD  . feeding supplement (NEPRO CARB STEADY)  237 mL Oral TID BM  . feeding supplement (PRO-STAT SUGAR FREE 64)  30 mL Oral BID  . insulin aspart  0-15 Units Subcutaneous TID WC  . insulin aspart  0-5 Units Subcutaneous QHS  . insulin aspart  3 Units Subcutaneous TID WC  . metoCLOPramide  5 mg Oral BID WC  . metoprolol succinate  50 mg Oral Daily  . multivitamin  1 tablet Oral QHS  . pantoprazole  40 mg Oral Daily  . predniSONE  5 mg Oral Q breakfast  . sodium chloride flush  3 mL Intravenous Q12H  . warfarin  4 mg Oral ONCE-1800  . Warfarin - Pharmacist Dosing Inpatient   Does not apply q1800   Continuous Infusions: . sodium chloride 10 mL/hr at 09/14/19 1008    LOS: 64 days   Kerney Elbe, DO [facility] PAGER is on [place]  If  7PM-7AM, please contact night-coverage [email or web address]

## 2019-09-20 NOTE — Discharge Planning (Signed)
Spoke with [place]ar[place] manager at [facility]. Since patient has 2 negative COVID tests he can return there MWF with arrival time of 11:30. Case Manager is working on placement at [facility] and will advise when arrangements are complete.

## 2019-09-20 NOTE — Discharge Planning (Signed)
[place] will need new orders on this patient since he has been away from the clinic for an extended period of time.

## 2019-09-20 NOTE — Progress Notes (Signed)
Subjective:   Seen in room. No complaints.   Objective Vital signs in last 24 hours: Vitals:   09/19/19 1607 09/19/19 2100 09/20/19 0528 09/20/19 0900  BP: 112/64 (!) 136/100 130/79 127/71  Pulse: 89 92 81 82  Resp: 18 16  18   Temp: 98 F (36.7 C) 98.4 F (36.9 C) 97.6 F (36.4 C) 97.8 F (36.6 C)  TempSrc: Oral Oral Oral   SpO2: 100% 98% 98% 97%  Weight:  63.9 kg    Height:       Weight change: -0.4 kg  Physical Exam General: Alert, lying in bed, follows commands, nad  Heart:RRR,   Lungs:CTAB Abdomen:soft, non-tender  Extremities:no LE edema Dialysis Access:RU AVF +bruit   Dialysis Orders: TTSG/O -[facility] when COVID 19 negative 4 hr 15 min 180NRE 400/800 80.5 kg 2.0 K/ 2.0 Ca R AVF -Heparin 8000 units IV TIW -Mircera 75 mcg IV q 2 weeks (last dose 07/25/19) -Venofer 50 mg IV weekly -Parsabiv 7.5 mg IV TIW   Problem/Plan  1.Acute COVID-19 pneumonia - s/p remdesivir &Decadron.  Out of isolation window and has had 2 negative test results. He can return to [facility] when SNF is arranged.  2. Fevers Recurrent  -T max 99.3 past 3 days/  TEE no vegetations / Monitor off Antibx / ID has signed off / Ortho consult =  Left Psoas hematoma on CT ="unlikley infected  No intervention currently " 2.ESRD- HD on TTS. TDC removed1/26. Next HD  2/13.  3. Anemiaof CKD-Hgb10.7.  Aranesp to 150 q Tues.  4. Secondary hyperparathyroidism-Ca/P ok. No binders or VDRA. No parsabiv because not on formulary.  5.HTN/volume-Does not appear volume overloaded. Blood pressure well controlled.Well below EDW, will need to lower at d/c 6. Nutrition- Renal diet. Continue renal vit/prostat. 7.LV thrombus-coumadin. 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 Per primary 9.Disposition: SNF placement when bed available- having placement difficulties.  Lynnda Child PA-C [facility] Pager [phone] 09/20/2019,12:55  PM  LOS: 52 days   Labs: Basic Metabolic Panel: Recent Labs  Lab 09/18/19 0518 09/19/19 0430 09/20/19 0333  NA 137 135 136  K 4.0 4.5 3.9  CL 96* 91* 94*  CO2 28 25 28   GLUCOSE 91 175* 223*  BUN 32* 47* 30*  CREATININE 5.97* 7.63* 5.43*  CALCIUM 8.6* 8.9 8.8*  PHOS 3.7 3.7 3.4   Liver Function Tests: Recent Labs  Lab 09/15/19 0741 09/17/19 0530 09/18/19 1045 09/19/19 0430 09/20/19 0333  AST 20  --  22  --  19  ALT 26  --  22  --  21  ALKPHOS 65  --  72  --  76  BILITOT 0.4  --  0.7  --  0.5  PROT 6.9  --  6.8  --  6.6  ALBUMIN  --    < > 2.4* 2.4* 2.5*   < > = values in this interval not displayed.   No results for input(s): LIPASE, AMYLASE in the last 168 hours. No results for input(s): AMMONIA in the last 168 hours. CBC: Recent Labs  Lab 09/15/19 0713 09/15/19 0713 09/17/19 0530 09/17/19 0530 09/17/19 1239 09/17/19 1239 09/18/19 1045 09/19/19 0658 [id number]  WBC DUPLICATE REQUEST  8.0   < > 7.3   < > 6.7   < > 9.2 9.1 9.2  NEUTROABS 5.4  --   --   --   --   --  5.9  --  6.0  HGB  DUPLICATE REQUEST  [id number]*   < > 9.8*   < > 9.5*   < > 10.7* [id number]* 0000000*  HCT DUPLICATE REQUEST  [id number]*   < > 31.1*   < > 30.9*   < > 35.2* [id number]* [id number]*  MCV DUPLICATE REQUEST  [id number]*   < > 101.6*  --  102.7*  --  102.9* 102.4* [id number]*  PLT DUPLICATE REQUEST  [id number]   < > 277   < > 276   < > 289 278 268   < > = values in this interval not displayed.   Cardiac Enzymes: No results for input(s): CKTOTAL, CKMB, CKMBINDEX, TROPONINI in the last 168 hours. CBG: Recent Labs  Lab 09/19/19 1122 09/19/19 1606 09/19/19 2138 09/20/19 0655 09/20/19 1113  GLUCAP 158* 193* 253* 156* 193*    Studies/Results: MR LUMBAR SPINE WO CONTRAST  Result Date: 09/18/2019 CLINICAL DATA:  Psoas abscess. EXAM: MRI LUMBAR SPINE WITHOUT CONTRAST TECHNIQUE: Multiplanar, multisequence MR imaging of the lumbar spine was performed. No intravenous contrast was administered. COMPARISON:  Correlation  made with CT of the abdomen obtained September 17, 2019 FINDINGS: Segmentation:  Standard. Alignment:  Physiologic. Vertebrae: No fracture, evidence of discitis, or bone lesion. Prominent degenerative changes are seen in the L4-5 endplates. Conus medullaris and cauda equina: Conus extends to the L1 level. Conus and cauda equina appear normal. Paraspinal and other soft tissues: A large left psoas hematoma is noted extending from the level of the 3 no L5. The hematoma measures approximately 10.4 by 5.28 x 5.8 cm (cc, AP, AP), unchanged from prior CT. There is mild edema in the adjacent left iliac muscle. Small cysts are seen in the bilateral native kidneys. 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 Disc levels: L1-2, L2-3: No spinal canal or neural foraminal stenosis. L3-4: Shallow disc bulge with superimposed right foraminal disc protrusion and facet degenerative change resulting in moderate narrowing of the right neural foramen. There is no significant spinal canal stenosis. L4-5: Disc bulge with superimposed right central disc protrusion no associated facet degenerative changes and ligamentum flavum hypertrophy resulting in mild spinal canal stenosis, mild right and mild to moderate left neural foraminal narrowing. L5-S1: Mild facet degenerative changes. No spinal canal or neural foraminal stenosis. IMPRESSION: 1. Large left psoas hematoma, similar in size compared to prior CT. 2. No evidence of discitis/osteomyelitis. 3. 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 Electronically Signed   By: Pedro Earls M.D.   On: 09/18/2019 15:30   Medications: . sodium chloride 10 mL/hr at 09/14/19 1008   .  stroke: mapping our early stages of recovery book   Does not apply Once  . allopurinol  100 mg Oral Daily  . aspirin  81 mg Oral Daily  .  atorvastatin  40 mg Oral Daily  . Chlorhexidine Gluconate Cloth  6 each Topical Q0600  . darbepoetin (ARANESP) injection - DIALYSIS  150 mcg Intravenous Q Tue-HD  . feeding supplement (NEPRO CARB STEADY)  237 mL Oral TID BM  . feeding supplement (PRO-STAT SUGAR FREE 64)  30 mL Oral BID  . insulin aspart  0-15 Units Subcutaneous TID WC  . insulin aspart  0-5 Units Subcutaneous QHS  . insulin aspart  3 Units Subcutaneous TID WC  . metoCLOPramide  5 mg Oral BID WC  . metoprolol succinate  50 mg Oral Daily  . multivitamin  1 tablet Oral QHS  . pantoprazole  40 mg Oral Daily  . predniSONE  5 mg Oral Q breakfast  . sodium chloride flush  3 mL Intravenous Q12H  . warfarin  4 mg Oral ONCE-1800  . Warfarin - Pharmacist Dosing Inpatient   Does not apply [phone]

## 2019-09-20 NOTE — TOC Progression Note (Addendum)
Transition of Care (TOC) - Progression Note  *Patient will d/c to [facility] Saturday, 09/21/19   Patient Details  Name: Jonathan Adley Sr. MRN: [id number] Date of Birth: Aug 19, 1950  Transition of Care [facility]) CM/SW Contact  Sharlet Salina Mila Homer, LCSW Phone Number: 09/20/2019, 2:02 PM  Clinical Narrative: Patient accepted at [facility] for [place] rehab. [employer] with [employer] contacted and patient is managed by them. Patient's pending reference number is [id number]. Clinicals submitted to [facility] (fax [phone]) for review.   Mr. Kortan will be returning to [facility], [address] for dialysis; MWF, arriving at 11:30 am per Althia Forts with [facility].   3:08 pm - Received a call from Santiago Glad with [employer] regarding patient. Asked if patient is ambulating and advised that he is not per therapy notes. Santiago Glad also asked if patient will d/c on any IV meds and was advised by Ellard Artis, nurse case manager that per current med  list, he will not.  4:08 pm - Received insurance auth from [place]. Josem Kaufmann [id number], [id number], eff. 09/20/19. Next review date 2/16. Care Coordinator Is Evalina Field; fax [phone]. [employer] call-back number is [phone].   4:39 pm - TC with Narda Rutherford at [facility] [employer] information provided. Per Narda Rutherford, patient can d/c to them on Saturday. Wife needs to be contacted regarding coming to the facility on Sat. At 9 and completing admissions paperwork. The 2/11 COVID test is good for patient's admission per [facility].  5:35 pm and 4:45 pm- Call made to Mrs. Cypret and message left regarding patient's discharge to [facility] on Saturday and her needing to do his admissions paperwork at [facility] Saturday morning.  5:49 pm - Received call from Mrs. Banducci and updated her regarding Saturday discharge and her going to [facility] Saturday morning at 9 am to do paperwork. Sent text to Narda Rutherford, admissions director at [facility]  and informed her that wife advised re: doing paperwork on Saturday.    Expected Discharge Plan: [place]    Expected Discharge Plan and Services Expected Discharge Plan: [place] arrangements for the past 2 months: Single Family Home                                     Social Determinants of Health (SDOH) Interventions  No SDOH interventions needed or requested at this time  Readmission Risk Interventions No flowsheet data found.

## 2019-09-20 NOTE — Plan of Care (Signed)
  Problem: Activity: Goal: Risk for activity intolerance will decrease Outcome: Progressing   

## 2019-09-21 LAB — CBC WITH DIFFERENTIAL/PLATELET
Abs Immature Granulocytes: 0.07 10*3/uL (ref 0.00–0.07)
Basophils Absolute: 0 10*3/uL (ref 0.0–0.1)
Basophils Relative: 0 %
Eosinophils Absolute: 0.1 10*3/uL (ref 0.0–0.5)
Eosinophils Relative: 1 %
HCT: 34.7 % — ABNORMAL LOW (ref 39.0–52.0)
Hemoglobin: 10.7 g/dL — ABNORMAL LOW (ref 13.0–17.0)
Immature Granulocytes: 1 %
Lymphocytes Relative: 23 %
Lymphs Abs: 2 10*3/uL (ref 0.7–4.0)
MCH: 31.4 pg (ref 26.0–34.0)
MCHC: 30.8 g/dL (ref 30.0–36.0)
MCV: 101.8 fL — ABNORMAL HIGH (ref 80.0–100.0)
Monocytes Absolute: 0.8 10*3/uL (ref 0.1–1.0)
Monocytes Relative: 9 %
Neutro Abs: 5.9 10*3/uL (ref 1.7–7.7)
Neutrophils Relative %: 66 %
Platelets: 290 10*3/uL (ref 150–400)
RBC: 3.41 MIL/uL — ABNORMAL LOW (ref 4.22–5.81)
RDW: 17.7 % — ABNORMAL HIGH (ref 11.5–15.5)
WBC: 8.9 10*3/uL (ref 4.0–10.5)
nRBC: 0 % (ref 0.0–0.2)

## 2019-09-21 LAB — RENAL FUNCTION PANEL
Albumin: 2.7 g/dL — ABNORMAL LOW (ref 3.5–5.0)
Anion gap: 18 — ABNORMAL HIGH (ref 5–15)
BUN: 42 mg/dL — ABNORMAL HIGH (ref 8–23)
CO2: 24 mmol/L (ref 22–32)
Calcium: 9.1 mg/dL (ref 8.9–10.3)
Chloride: 93 mmol/L — ABNORMAL LOW (ref 98–111)
Creatinine, Ser: 7.15 mg/dL — ABNORMAL HIGH (ref 0.61–1.24)
GFR calc Af Amer: 8 mL/min — ABNORMAL LOW (ref >60)
GFR calc non Af Amer: 7 mL/min — ABNORMAL LOW (ref >60)
Glucose, Bld: 165 mg/dL — ABNORMAL HIGH (ref 70–99)
Phosphorus: 4.7 mg/dL — ABNORMAL HIGH (ref 2.5–4.6)
Potassium: 4.8 mmol/L (ref 3.5–5.1)
Sodium: 135 mmol/L (ref 135–145)

## 2019-09-21 LAB — PROTIME-INR
INR: 2.5 — ABNORMAL HIGH (ref 0.8–1.2)
Prothrombin Time: 26.5 seconds — ABNORMAL HIGH (ref 11.4–15.2)

## 2019-09-21 LAB — GLUCOSE, CAPILLARY
Glucose-Capillary: 137 mg/dL — ABNORMAL HIGH (ref 70–99)
Glucose-Capillary: 107 mg/dL — ABNORMAL HIGH (ref 70–99)
Glucose-Capillary: 241 mg/dL — ABNORMAL HIGH (ref 70–99)
Glucose-Capillary: 315 mg/dL — ABNORMAL HIGH (ref 70–99)

## 2019-09-21 LAB — MAGNESIUM: Magnesium: 2.2 mg/dL (ref 1.7–2.4)

## 2019-09-21 MED ORDER — ACETAMINOPHEN 325 MG PO TABS: 650.0000 mg | ORAL_TABLET | Freq: Four times a day (QID) | ORAL | Status: DC | PRN

## 2019-09-21 MED ORDER — GLUCAGON HCL RDNA (DIAGNOSTIC) 1 MG IJ SOLR: 1.0000 mg | Freq: Once | INTRAMUSCULAR | Status: AC | PRN

## 2019-09-21 MED ORDER — LEVALBUTEROL TARTRATE 45 MCG/ACT IN AERO: 2.0000 | INHALATION_SPRAY | Freq: Three times a day (TID) | RESPIRATORY_TRACT | 12 refills | Status: AC | PRN

## 2019-09-21 MED ORDER — STROKE: EARLY STAGES OF RECOVERY BOOK: 1.0000 | Freq: Once | Status: AC

## 2019-09-21 MED ORDER — ONDANSETRON HCL 4 MG PO TABS: 4.0000 mg | ORAL_TABLET | Freq: Four times a day (QID) | ORAL | 0 refills | Status: DC | PRN

## 2019-09-21 MED ORDER — WARFARIN SODIUM 4 MG PO TABS
4.0000 mg | ORAL_TABLET | Freq: Once | ORAL | Status: AC
  Administered 2019-09-21: 4 mg via ORAL
  Filled 2019-09-21: qty 1

## 2019-09-21 MED ORDER — TRESIBA FLEXTOUCH 100 UNIT/ML ~~LOC~~ SOPN: 6.0000 [IU] | PEN_INJECTOR | Freq: Every day | SUBCUTANEOUS | Status: DC

## 2019-09-21 MED ORDER — HEPARIN SODIUM (PORCINE) 1000 UNIT/ML IJ SOLN
5000.0000 [IU] | Freq: Once | INTRAMUSCULAR | Status: AC
  Administered 2019-09-21: 5000 [IU] via INTRAVENOUS

## 2019-09-21 MED ORDER — WARFARIN SODIUM 4 MG PO TABS: 4.0000 mg | ORAL_TABLET | Freq: Every day | ORAL | Status: DC

## 2019-09-21 MED ORDER — NEPRO/CARBSTEADY PO LIQD: 237.0000 mL | Freq: Three times a day (TID) | ORAL | 0 refills | Status: DC

## 2019-09-21 MED ORDER — PANTOPRAZOLE SODIUM 40 MG PO TBEC: 40.0000 mg | DELAYED_RELEASE_TABLET | Freq: Every day | ORAL | Status: DC

## 2019-09-21 MED ORDER — PRO-STAT SUGAR FREE PO LIQD: 30.0000 mL | Freq: Two times a day (BID) | ORAL | 0 refills | Status: DC

## 2019-09-21 NOTE — Progress Notes (Addendum)
ANTICOAGULATION CONSULT NOTE - Follow Up Consult  Pharmacy Consult for Coumadin Indication: LV thrombus, CVA  No Known Allergies  Patient Measurements: Height: 5\' 11"  (180.3 cm) Weight: 140 lb 14 oz (63.9 kg) IBW/kg (Calculated) : 75.3  Vital Signs: Temp: 97.5 F (36.4 C) (02/13 0526) Temp Source: Oral (02/13 0526) BP: 132/76 (02/13 0830) Pulse Rate: 86 (02/13 0830)  Labs: Recent Labs    09/18/19 1045 09/18/19 1045 09/19/19 0430 09/19/19 0658 09/20/19 0333 09/21/19 0517  HGB 10.7*   < >  --  10.1* 10.7*  --   HCT 35.2*  --   --  33.5* 34.9*  --   PLT 289  --   --  278 268  --   LABPROT  --   --  26.2*  --  25.2* 26.5*  INR  --   --  2.4*  --  2.3* 2.5*  CREATININE  --   --  7.63*  --  5.43* 7.15*   < > = values in this interval not displayed.    Estimated Creatinine Clearance: 9.2 mL/min (A) (by C-G formula based on SCr of 7.15 mg/dL (H)).   Assessment: 69 yo male previously on apixaban for LV thrombus which was changed to warfarin on this admission. Pt noted to have recent stroke on MRI 12/23.   INR has been labile this admission - warfarin was held x2 doses 2/2-2/3 for high INR, then had low INR of 1.8 on 2/7. INR remains therapeutic today at 2.5. Warfarin 4mg  ordered for yesterday was not given and documented as patient/family refused. CBC stable yesterday. No reported bleeding.   Of note, CT abdomen pelvis from 2/9 shows L psoas IM hematoma and possible hemopericardium, still thought to be hematoma on MRI lumbar spine. Per discussion with Dr. Alfredia Ferguson on 2/12, ok to continue warfarin for now. Orthopedics to evaluate patient.   Goal of Therapy:  INR 2-3 Monitor platelets by anticoagulation protocol: Yes   Plan:  Warfarin 4mg  PO x 1 dose tonight Monitor INR, CBC, and S/S of bleeding daily   Cristela Felt, PharmD PGY1 Pharmacy Resident [employer]: [phone]  09/21/2019 8:51 AM

## 2019-09-21 NOTE — Plan of Care (Signed)
  Problem: Education: Goal: Knowledge of risk factors and measures for prevention of condition will improve Outcome: Adequate for Discharge   Problem: Coping: Goal: Psychosocial and spiritual needs will be supported Outcome: Adequate for Discharge   Problem: Education: Goal: Knowledge of disease or condition will improve Outcome: Adequate for Discharge Goal: Knowledge of secondary prevention will improve Outcome: Adequate for Discharge Goal: Knowledge of patient specific risk factors addressed and post discharge goals established will improve Outcome: Adequate for Discharge   Problem: Health Behavior/Discharge Planning: Goal: Ability to manage health-related needs will improve Outcome: Adequate for Discharge

## 2019-09-21 NOTE — Progress Notes (Signed)
Subjective:   Seen on HD. Had some low BPs on HD. UF goal lowered. Alert, denies cp, sob, n,v.   Objective Vital signs in last 24 hours: Vitals:   09/20/19 0900 09/20/19 1619 09/21/19 0526 09/21/19 0830  BP: 127/71 135/74 (!) 145/81 132/76  Pulse: 82 75 82 86  Resp: 18 18 14    Temp: 97.8 F (36.6 C) (!) 97.5 F (36.4 C) (!) 97.5 F (36.4 C)   TempSrc:  Oral Oral   SpO2: 97% 100% 98%   Weight:      Height:       Weight change:   Physical Exam General: Alert, lying in bed, follows commands, nad  Heart:RRR,   Lungs:CTAB Abdomen:soft, non-tender  Extremities:no LE edema Dialysis Access:RU AVF +bruit   Dialysis Orders: TTSG/O -[facility] when COVID 19 negative 4 hr 15 min 180NRE 400/800 80.5 kg 2.0 K/ 2.0 Ca R AVF -Heparin 8000 units IV TIW -Mircera 75 mcg IV q 2 weeks (last dose 07/25/19) -Venofer 50 mg IV weekly -Parsabiv 7.5 mg IV TIW   Problem/Plan  1.Acute COVID-19 pneumonia - s/p remdesivir &Decadron.  Out of isolation window and has had 2 negative test results. He can return to [facility] when SNF is arranged.  2. Fevers Recurrent  - TEE no vegetations / Monitor off Antibx / ID has signed off / Ortho consult =  Left Psoas hematoma on CT ="unlikley infected  No intervention currently " 2.ESRD- HD on TTS. TDC removed1/26. For HD today. On MWF at [facility]. Plan next HD 2/15.  3. Anemiaof CKD-Hgb10.7.  Aranesp to 150 q Tues. Monitor.  4. Secondary hyperparathyroidism-Ca/P ok. No binders or VDRA. No parsabiv because not on formulary.  5.HTN/volume-Does not appear volume overloaded. Blood pressure well controlled.Well below EDW, will need to lower at d/c 6. Nutrition- Renal diet. Continue renal vit/prostat. 7.LV thrombus-coumadin. 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 Per primary 9.Disposition: SNF placement -accepted at [facility] PA-C [place] Pager [phone] 09/21/2019,10:46  AM  LOS: 53 days   Labs: Basic Metabolic Panel: Recent Labs  Lab 09/19/19 0430 09/20/19 0333 09/21/19 0517  NA 135 136 135  K 4.5 3.9 4.8  CL 91* 94* 93*  CO2 25 28 24   GLUCOSE 175* 223* 165*  BUN 47* 30* 42*  CREATININE 7.63* 5.43* 7.15*  CALCIUM 8.9 8.8* 9.1  PHOS 3.7 3.4 4.7*   Liver Function Tests: Recent Labs  Lab 09/15/19 0741 09/17/19 0530 09/18/19 1045 09/18/19 1045 09/19/19 0430 09/20/19 0333 09/21/19 0517  AST 20  --  22  --   --  19  --   ALT 26  --  22  --   --  21  --   ALKPHOS 65  --  72  --   --  76  --   BILITOT 0.4  --  0.7  --   --  0.5  --   PROT 6.9  --  6.8  --   --  6.6  --   ALBUMIN  --    < > 2.4*   < > 2.4* 2.5* 2.7*   < > = values in this interval not displayed.   No results for input(s): LIPASE, AMYLASE in the last 168 hours. No results for input(s): AMMONIA in the last 168 hours. CBC: Recent Labs  Lab 09/17/19 1239 09/17/19 1239 09/18/19 1045 09/18/19 1045 09/19/19 0658 09/20/19 0333 09/21/19 0954  WBC 6.7   < >  9.2   < > 9.1 9.2 8.9  NEUTROABS  --   --  5.9  --   --  6.0 5.9  HGB 9.5*   < > 10.7*   < > 10.1* 10.7* 10.7*  HCT 30.9*   < > 35.2*   < > 33.5* 34.9* 34.7*  MCV 102.7*  --  102.9*  --  102.4* 102.9* 101.8*  PLT 276   < > 289   < > 278 268 290   < > = values in this interval not displayed.   Cardiac Enzymes: No results for input(s): CKTOTAL, CKMB, CKMBINDEX, TROPONINI in the last 168 hours. CBG: Recent Labs  Lab 09/20/19 0655 09/20/19 1113 09/20/19 1620 09/20/19 2109 09/21/19 0651  GLUCAP 156* 193* 142* 114* 137*    Studies/Results: No results found. Medications: . sodium chloride 10 mL/hr at 09/14/19 1008   .  stroke: mapping our early stages of recovery book   Does not apply Once  . allopurinol  100 mg Oral Daily  . aspirin  81 mg Oral Daily  . atorvastatin  40 mg Oral Daily  . Chlorhexidine Gluconate Cloth  6 each Topical Q0600  . darbepoetin (ARANESP) injection - DIALYSIS  150 mcg Intravenous Q  Tue-HD  . feeding supplement (NEPRO CARB STEADY)  237 mL Oral TID BM  . feeding supplement (PRO-STAT SUGAR FREE 64)  30 mL Oral BID  . insulin aspart  0-15 Units Subcutaneous TID WC  . insulin aspart  0-5 Units Subcutaneous QHS  . insulin aspart  3 Units Subcutaneous TID WC  . metoCLOPramide  5 mg Oral BID WC  . metoprolol succinate  50 mg Oral Daily  . multivitamin  1 tablet Oral QHS  . pantoprazole  40 mg Oral Daily  . predniSONE  5 mg Oral Q breakfast  . sodium chloride flush  3 mL Intravenous Q12H  . warfarin  4 mg Oral ONCE-1800  . Warfarin - Pharmacist Dosing Inpatient   Does not apply [phone]

## 2019-09-21 NOTE — TOC Progression Note (Signed)
Transition of Care (TOC) - Progression Note    Patient Details  Name: Dashone Bixler Sr. MRN: [id number] Date of Birth: 06-01-1951  Transition of Care [facility]) CM/SW [place], [place] Phone Number: [phone] 09/21/2019, 3:45 PM  Clinical Narrative:     CSW spoke with admission at  [facility] who reports patient's family no showed to complete admission paperwork due to weather. She reports she will have family complete in the morning. Requesting dc summary be sent this afternoon to prepare for dc tomorrow.   MD made aware.   Expected Discharge Plan: [place]    Expected Discharge Plan and Services Expected Discharge Plan: [place] arrangements for the past 2 months: Single Family Home                                       Social Determinants of Health (SDOH) Interventions    Readmission Risk Interventions No flowsheet data found.

## 2019-09-21 NOTE — Plan of Care (Signed)
  Problem: Activity: Goal: Risk for activity intolerance will decrease Outcome: Progressing   

## 2019-09-21 NOTE — Discharge Summary (Signed)
Physician Discharge Summary  Jonathan Valls Sr. [id number] DOB: [birth year] DOA: 07/30/2019  PCP: Marton Redwood, MD  Admit date: 07/30/2019 Discharge date: 09/21/2019  Admitted From: Home Disposition: SNF  Recommendations for Outpatient Follow-up:  1. Follow up with PCP in 1-2 weeks 2. Follow up with Nephrology within 1 week 3. Follow up with Cardiology within 1-2 weeks 4. Follow up with Neurology within 2-4 weeks 5. Follow up with Infectious Diseases if Necessary 6. Follow up with Orthopedic Surgery if Necessary 7. Please obtain CMP/CBC, Mag, Phos in one week 8. Repeat PT-INR within 1 week and Adjust Coumadin as Necessary 9. 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 MRI brain showed small infarct in the left pons left occiput. Neurology saw the patient and felt this was secondary to left ventricular thrombus ( 2D echo did not show any clots or PFO); he was started on heparin. He was diagnosed with Covid infection on 07/24/2019 and was treated for Infection at that time. Patient has recurrently tested positive -probably positivity from his index infection in December but now has tested Negative Twice.  Hospital course complicated by fever spikes on 1/30, 2/1,2/3 , 2/5 and 2/6 (102.16F) which has now resolved.  Infectious diseases evaluated and ordered MRI and recommending monitoring off of antibiotics.  Hospitalization is further been complicated by left psoas hematoma.  TEE was done on 2/10 and  showed no evidence of any cardiac embolism with no evidence of any clot.  Orthopedics evaluated his hematoma and recommending no current intervention. PT OT recommending SNF and he can return to his regular dialysis center now that he is tested negative twice for COVID-19 disease.  Patient appears stable for discharge from a medical perspective though he was a little confused this morning and only oriented to himself and agitated when I told him that the year was not 2003 and that it was 2021 and he told me to leave him alone then and stated that he did not like me.  Discharge Diagnoses:  Principal Problem:   FUO (fever of unknown origin) Active Problems:   Chronic hepatitis C without hepatic coma ([name])   Diabetes mellitus type 2, uncomplicated ([name])   History of coronary artery disease   Hypertension   ESRD (end stage renal disease) on dialysis ([name])   Acute respiratory failure due to COVID-19 [facility])   Acute respiratory failure with hypoxia ([name])   Cerebral thrombosis with cerebral infarction   Cerebral embolism with cerebral infarction  Acute left pons and left occipital strokes -Etiology unclear but though to be secondary to Covid induced hypercoaguablilty versus LV thrombus. -Neurology saw the patient and felt this was secondary to left ventricular thrombus;  -He was started on heparin which had been changed to Eliquis and now Coumadin and his INR is therapeutic at 2.5; continue with Coumadin 4 mg p.o. daily with a PT/INR check within 3 days -Continue ASA, atorvastatin 40 mg p.o. daily, Eliquis changed to coumadin;  -Coumadin per Pharmacy Dosing  -SNF placement recommended by PT and currently there is no facility for the patient  to go to but he is now tested negative twice for COVID-19 disease and can return to his regular dialysis center; social worker consulted for assistance with placement and are looking to [place] and he was accepted and is able to be  discharged -Will place on Delirium Precautions -Yesterday was alert and oriented x1 today and became extremely agitated and upset when I asked him orientation questions and told him the correct responses; today he is more calm and slightly improved  Fevers, improved -Secondary to aspiration vs acute infections but now improved  -.Urine culture NGTD. However, patient received Rocephin x 5 daysfor presumed UTI/aspiration during HC.  -Blood cultures1/30, 2/7after fevers, negative to date. 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  -HD catheter removed 1/26 and site looks okay with no redness but has mild draiange.  -Given transplant h/o and concern for recurrent fevers, consulted ID.  -Consult speech to r/o ongoing aspiration.  -CT chest /abd/pelvis ordered(changed to WO contrast as radiology woundnt give contrast if patient been on dialysis for <49months)per d/w Dr Tommy Medal and showed "Left psoas intramuscular hematoma. Multifocal pneumonia, likely viral etiology including COVID-19. Clinical correlation is recommended. 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 Cardiology consult and further evaluation with echocardiogram recommended. Coronary vascular calcification and Aortic Atherosclerosis. Cholelithiasis. No hydronephrosis or nephrolithiasis. Right lower quadrant transplant kidney appears slightly atrophic. Mildly thickened bladder wall. Correlation with urinalysis recommended to exclude UTI." -MRI ordered due concern for Abscesses and showed "Large left psoas hematoma, similar in size compared to prior CT. No evidence of discitis/osteomyelitis. Degenerative disc disease at L3-4 and L4-5 as described above, worst at L4-5 where there is mild spinal canal  stenosis, mild right and mild to moderate left neural foraminal narrowing." -Infectious disease evaluated and recommends watching off of antibiotics for now he has been afebrile for greater than 72 hours. -Patient has been afebrile and is stable  Large Left Psoas Hematoma -Continue with anticoagulation this point given his left ventricular thrombus and need for anticoagulation -hemoglobin/hematocrit remains relatively stable at 10.7/34.7 -Discussed with Dr. Percell Miller of orthopedics who will formally see the patient in the morning and recommends currently no indication for drainage at this point they currently would not recommend any intervention at this time given that is unlikely to be infected given his lack of symptomatology   Recent COVID-19 pneumonia -Initially tested positive in December 2020 and completed appropriate treatment.  -No longer infectious and off of precautions at this point.  -Assumed that patient could continue to test positive for 90 days from his initial positive test. -SNF wants 2 Negative Tests prior to D/C -SpO2: 97 % O2 Flow Rate (L/min): 2 L/min COVID-19 Labs  No results for input(s): DDIMER, FERRITIN, LDH, CRP in the last 72 hours.  Lab Results  Component Value Date   SARSCOV2NAA NEGATIVE 09/19/2019   SARSCOV2NAA NEGATIVE 09/16/2019   SARSCOV2NAA POSITIVE (A) 08/22/2019   SARSCOV2NAA POSITIVE (A) 07/24/2019   -Continue monitor chest x-ray intermittently but currently is improved from a respiratory standpoint -Continue Xopenex 2 puffs elations every 8 hours as needed for wheezing  ?LV thrombus -Not seen on echo, not sure if TEE deferred due to [place].  -Coumadin per pharmacy and will discharge on 4 mg daily with an INR check in 3 days -TEE done and showed no hemipericardium and there  is no cardiac source of embolism noted  ESRD/HD TTSa Elevated Anion Gap Hyperphosphatemia  -Nephrology following. Per renal patient [facility]ze at [facility] until  COVID tests negative (Tested Negative on 09/16/19 and again yesterday 09/18/2019) -Patient's BUN/creatinine went from 55/7.87 -> 32/5.97 -> 47/7.63 -> 30/5.43 -> 42/7.15 -Continue with renal/carb modified diet with 1200 mL fluid restriction -AG was 18 -Phos Level went from 3.4 -> 4.7 and expect to correct with Dialysis  -Nephrology continuing Darbepoetin alpha 150 mcg every Tuesday for hemodialysis -Continue with Rena-Vit -Patient underwent Dialysis today and was doing well.    Prior cadaveric renal transplant -Continue prednisone. Fever w/u as above and his fevers have improved   Hyperglycemia, Pre-Diabetes -Continue moderate NovoLog sliding scale insulin before meals and at bedtime -CBGs ranging from 107-193 -Continue with home NovoLog sliding scale and have reduced his home Tresiba from 12 units to 6 units and can have further adjustments at the SNF  HTN -Currently his losartan 25 g p.o. daily has been held but can be resumed at discharge -He is on metoprolol succinate 50 mg p.o. daily but this was held and will resume -Change to monitor blood pressures very carefully and Last BP was 118/68 and previously it was 145/83  Macrocytic Anemia -Patient's Hb/Hct is now 10.7/34.7 and relatively stable  -MCV was 101.8 -Continue to Monitor for S/Sx of Bleeding; Currently no Active bleeding noted -Repeat CBC in AM   Discharge Instructions  Discharge Instructions    Call MD for:  difficulty breathing, headache or visual disturbances   Complete by: As directed    Call MD for:  extreme fatigue   Complete by: As directed    Call MD for:  hives   Complete by: As directed    Call MD for:  persistant dizziness or light-headedness   Complete by: As directed    Call MD for:  persistant nausea and vomiting   Complete by: As directed    Call MD for:  redness, tenderness, or signs of infection (pain, swelling, redness, odor or green/yellow discharge around incision site)   Complete by: As  directed    Call MD for:  severe uncontrolled pain   Complete by: As directed    Call MD for:  temperature >100.4   Complete by: As directed    Diet - low sodium heart healthy   Complete by: As directed    RENAL Diet with 1200 mL Fluid Restriction   Discharge instructions   Complete by: As directed    You were cared for by a hospitalist during your hospital stay. If you have any questions about your discharge medications or the care you received while you were in the hospital after you are discharged, you can call the unit and ask to speak with the hospitalist on call if the hospitalist that took care of you is not available. Once you are discharged, your primary care physician will handle any further medical issues. Please note that NO REFILLS for any discharge medications will be authorized once you are discharged, as it is imperative that you return to your primary care physician (or establish a relationship with a primary care physician if you do not have one) for your aftercare needs so that they can reassess your need for medications and monitor your lab values.  Follow up with PCP, Neurology, Nephrology, Cardiology, and with Orthopedic Surgery (if needed). Take all medications as prescribed. If symptoms change or worsen please return to the ED for evaluation   Increase  activity slowly   Complete by: As directed      Allergies as of 09/21/2019   No Known Allergies     Medication List    STOP taking these medications   apixaban 5 MG Tabs tablet Commonly known as: ELIQUIS     TAKE these medications    stroke: mapping our early stages of recovery book Misc 1 each by Does not apply route once for 1 dose.   acetaminophen 325 MG tablet Commonly known as: TYLENOL Take 2 tablets (650 mg total) by mouth every 6 (six) hours as needed for mild pain (or Fever >/= 101).   allopurinol 100 MG tablet Commonly known as: ZYLOPRIM Take 100 mg by mouth daily.   aspirin 81 MG chewable  tablet Chew 81 mg by mouth daily.   atorvastatin 40 MG tablet Commonly known as: LIPITOR Take 40 mg by mouth daily.   BD Pen Needle Nano U/F 32G X 4 MM Misc Generic drug: Insulin Pen Needle U TO INJECT 5 TIMES A DAY   calcium acetate 667 MG capsule Commonly known as: PHOSLO Take 1,334 mg by mouth 3 (three) times daily with meals.   feeding supplement (NEPRO CARB STEADY) Liqd Take 237 mLs by mouth 3 (three) times daily between meals.   feeding supplement (PRO-STAT SUGAR FREE 64) Liqd Take 30 mLs by mouth 2 (two) times daily.   glucagon (human recombinant) 1 MG injection Commonly known as: GLUCAGEN Inject 1 mg into the muscle once as needed for low blood sugar.   levalbuterol 45 MCG/ACT inhaler Commonly known as: XOPENEX HFA Inhale 2 puffs into the lungs every 8 (eight) hours as needed for wheezing.   lidocaine-prilocaine cream Commonly known as: EMLA Apply 1 application topically 3 (three) times a week. 30 minutes prior to Dialysis   losartan 25 MG tablet Commonly known as: COZAAR Take 25 mg by mouth daily.   metoCLOPramide 5 MG tablet Commonly known as: REGLAN Take 5 mg by mouth 3 (three) times daily with meals.   metoprolol succinate 50 MG 24 hr tablet Commonly known as: TOPROL-XL Take 50 mg by mouth daily.   NovoLOG FlexPen 100 UNIT/ML FlexPen Generic drug: insulin aspart Inject 0-10 Units into the skin 2 (two) times daily before a meal. Per sliding scale   ondansetron 4 MG tablet Commonly known as: ZOFRAN Take 1 tablet (4 mg total) by mouth every 6 (six) hours as needed for nausea.   pantoprazole 40 MG tablet Commonly known as: PROTONIX Take 1 tablet (40 mg total) by mouth daily. Start taking on: September 22, 2019   predniSONE 5 MG tablet Commonly known as: DELTASONE Take 5 mg by mouth daily with breakfast.   Rena-Vite Rx 1 MG Tabs Take 1 tablet by mouth daily.   senna 8.6 MG tablet Commonly known as: SENOKOT Take 1 tablet by mouth as needed for  constipation.   sevelamer carbonate 2.4 g Pack Commonly known as: RENVELA Take 2.4 g by mouth 3 (three) times daily with meals.   Tyler Aas FlexTouch 100 UNIT/ML Sopn FlexTouch Pen Generic drug: insulin degludec Inject 0.06 mLs (6 Units total) into the skin daily. What changed: how much to take   warfarin 4 MG tablet Commonly known as: COUMADIN Take 1 tablet (4 mg total) by mouth daily at 6 PM.       No Known Allergies  Consultations:  Cardiology  Infectious Diseases   Nephrology   Orthopedic Surgery   Procedures/Studies: CT ABDOMEN PELVIS WO CONTRAST  Result Date:  09/17/2019 CLINICAL DATA:  69 year old male with abdominal pain and fever. History of COVID-19. EXAM: CT CHEST, ABDOMEN AND PELVIS WITHOUT CONTRAST TECHNIQUE: Multidetector CT imaging of the chest, abdomen and pelvis was performed following the standard protocol without IV contrast. COMPARISON:  Chest radiograph dated 09/15/2019. FINDINGS: Evaluation of this exam is limited in the absence of intravenous contrast. CT CHEST FINDINGS Cardiovascular: There is no cardiomegaly. There is a high attenuating pericardial effusion adjacent to the left ventricle measuring approximately 13 mm in thickness. This may represent hemorrhagic fluid. Clinical correlation and further evaluation with echocardiogram recommended. 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 Three vessel coronary vascular calcification noted. There is atherosclerotic calcification of thoracic aorta. The central pulmonary arteries are grossly unremarkable. Mediastinum/Nodes: No hilar or mediastinal adenopathy. The esophagus and the thyroid gland are grossly unremarkable. No mediastinal fluid collection. 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 Bilateral hazy and streaky densities most concerning for atypical infection and in keeping with history of COVID-19. Clinical correlation is recommended. No lobar consolidation,  pleural effusion, or pneumothorax. The central airways are patent. Musculoskeletal: No chest wall mass or suspicious bone lesions identified. CT ABDOMEN PELVIS FINDINGS No intra-abdominal free air or free fluid. Hepatobiliary: The liver is unremarkable. No intrahepatic biliary ductal dilatation. There are multiple small stones in the gallbladder. No pericholecystic fluid or evidence of acute cholecystitis by CT. Ultrasound may provide better evaluation. Pancreas: Unremarkable. No pancreatic ductal dilatation or surrounding inflammatory changes. Spleen: Normal in size without focal abnormality. Adrenals/Urinary Tract: The adrenal glands are unremarkable. Moderate bilateral renal parenchyma atrophy. There is no hydronephrosis or nephrolithiasis on either side. Small bilateral renal hypodense lesions are suboptimally characterized on this noncontrast CT. The visualized ureters appear unremarkable. The urinary bladder is minimally distended. There is apparent diffuse thickening of the bladder wall which may be partly related to underdistention. Cystitis is not excluded. Correlation with urinalysis recommended. 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 There is no hydronephrosis or nephrolithiasis of the transplant kidney. No peritransplant fluid collection identified. The transplant kidney however appears slightly small. Stomach/Bowel: Moderate amount of stool noted throughout the colon. Small scattered colonic diverticula without active inflammatory changes noted. There is no bowel obstruction or active inflammation. Normal appendix. Vascular/Lymphatic: Advanced aortoiliac atherosclerotic disease. The IVC is unremarkable. No portal venous gas. There is no adenopathy. Reproductive: The prostate and seminal vesicles are grossly unremarkable. Other: There is a left psoas intramuscular hematoma measuring 5.8 x 5.0 cm in greatest axial dimensions and approximately 10 cm in craniocaudal length. Musculoskeletal:  Degenerative changes of the spine primarily at L4-L5. No acute osseous pathology. IMPRESSION: 1. Left psoas intramuscular hematoma. 2. Multifocal pneumonia, likely viral etiology including COVID-19. Clinical correlation is recommended. 3. 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 Cardiology consult and further evaluation with echocardiogram recommended. 4. Coronary vascular calcification and Aortic Atherosclerosis (ICD10-I70.0). 5. Cholelithiasis. 6. No hydronephrosis or nephrolithiasis. Right lower quadrant transplant kidney appears slightly atrophic. 7. Mildly thickened bladder wall. Correlation with urinalysis recommended to exclude UTI. These results were called by telephone at the time of interpretation on 09/17/2019 at 9:39 pm to patient's nurse Ruthann Cancer, who verbally acknowledged these results. Electronically Signed   By: Anner Crete M.D.   On: 09/17/2019 21:50   DG Chest 2 View  Result Date: 09/10/2019 CLINICAL DATA:  Fever for 1 day EXAM: CHEST - 2 VIEW COMPARISON:  07/30/2019, 07/24/2019 FINDINGS: Frontal and lateral views of the  chest demonstrate chronic elevation left hemidiaphragm. Diffuse interstitial opacities are again noted, with slight improvement in the bibasilar ground-glass airspace disease seen previously. No effusion or pneumothorax. No acute bony abnormalities. IMPRESSION: 1. Persistent but slightly improving bibasilar ground-glass airspace disease consistent with multifocal pneumonia. Electronically Signed   By: Randa Ngo M.D.   On: 09/10/2019 09:00   CT CHEST WO CONTRAST  Result Date: 09/17/2019 CLINICAL DATA:  69 year old male with abdominal pain and fever. History of COVID-19. EXAM: CT CHEST, ABDOMEN AND PELVIS WITHOUT CONTRAST TECHNIQUE: Multidetector CT imaging of the chest, abdomen and pelvis was performed following the standard protocol without IV contrast. COMPARISON:  Chest radiograph dated 09/15/2019.  FINDINGS: Evaluation of this exam is limited in the absence of intravenous contrast. CT CHEST FINDINGS Cardiovascular: There is no cardiomegaly. There is a high attenuating pericardial effusion adjacent to the left ventricle measuring approximately 13 mm in thickness. This may represent hemorrhagic fluid. Clinical correlation and further evaluation with echocardiogram recommended. 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 Three vessel coronary vascular calcification noted. There is atherosclerotic calcification of thoracic aorta. The central pulmonary arteries are grossly unremarkable. Mediastinum/Nodes: No hilar or mediastinal adenopathy. The esophagus and the thyroid gland are grossly unremarkable. No mediastinal fluid collection. 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 Bilateral hazy and streaky densities most concerning for atypical infection and in keeping with history of COVID-19. Clinical correlation is recommended. No lobar consolidation, pleural effusion, or pneumothorax. The central airways are patent. Musculoskeletal: No chest wall mass or suspicious bone lesions identified. CT ABDOMEN PELVIS FINDINGS No intra-abdominal free air or free fluid. Hepatobiliary: The liver is unremarkable. No intrahepatic biliary ductal dilatation. There are multiple small stones in the gallbladder. No pericholecystic fluid or evidence of acute cholecystitis by CT. Ultrasound may provide better evaluation. Pancreas: Unremarkable. No pancreatic ductal dilatation or surrounding inflammatory changes. Spleen: Normal in size without focal abnormality. Adrenals/Urinary Tract: The adrenal glands are unremarkable. Moderate bilateral renal parenchyma atrophy. There is no hydronephrosis or nephrolithiasis on either side. Small bilateral renal hypodense lesions are suboptimally characterized on this noncontrast CT. The visualized ureters appear unremarkable. The urinary bladder is  minimally distended. There is apparent diffuse thickening of the bladder wall which may be partly related to underdistention. Cystitis is not excluded. Correlation with urinalysis recommended. 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 There is no hydronephrosis or nephrolithiasis of the transplant kidney. No peritransplant fluid collection identified. The transplant kidney however appears slightly small. Stomach/Bowel: Moderate amount of stool noted throughout the colon. Small scattered colonic diverticula without active inflammatory changes noted. There is no bowel obstruction or active inflammation. Normal appendix. Vascular/Lymphatic: Advanced aortoiliac atherosclerotic disease. The IVC is unremarkable. No portal venous gas. There is no adenopathy. Reproductive: The prostate and seminal vesicles are grossly unremarkable. Other: There is a left psoas intramuscular hematoma measuring 5.8 x 5.0 cm in greatest axial dimensions and approximately 10 cm in craniocaudal length. Musculoskeletal: Degenerative changes of the spine primarily at L4-L5. No acute osseous pathology. IMPRESSION: 1. Left psoas intramuscular hematoma. 2. Multifocal pneumonia, likely viral etiology including COVID-19. Clinical correlation is recommended. 3. 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 Cardiology consult and further evaluation with echocardiogram recommended. 4. Coronary vascular calcification and Aortic Atherosclerosis (ICD10-I70.0). 5. Cholelithiasis. 6. No hydronephrosis or nephrolithiasis. Right lower quadrant transplant kidney appears slightly atrophic. 7. Mildly thickened bladder wall. Correlation with urinalysis recommended to exclude UTI. These results were called  by telephone at the time of interpretation on 09/17/2019 at 9:39 pm to patient's nurse Ruthann Cancer, who verbally acknowledged these results. Electronically Signed   By: Anner Crete M.D.   On:  09/17/2019 21:50   MR LUMBAR SPINE WO CONTRAST  Result Date: 09/18/2019 CLINICAL DATA:  Psoas abscess. EXAM: MRI LUMBAR SPINE WITHOUT CONTRAST TECHNIQUE: Multiplanar, multisequence MR imaging of the lumbar spine was performed. No intravenous contrast was administered. COMPARISON:  Correlation made with CT of the abdomen obtained September 17, 2019 FINDINGS: Segmentation:  Standard. Alignment:  Physiologic. Vertebrae: No fracture, evidence of discitis, or bone lesion. Prominent degenerative changes are seen in the L4-5 endplates. Conus medullaris and cauda equina: Conus extends to the L1 level. Conus and cauda equina appear normal. Paraspinal and other soft tissues: A large left psoas hematoma is noted extending from the level of the 3 no L5. The hematoma measures approximately 10.4 by 5.28 x 5.8 cm (cc, AP, AP), unchanged from prior CT. There is mild edema in the adjacent left iliac muscle. Small cysts are seen in the bilateral native kidneys. 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 Disc levels: L1-2, L2-3: No spinal canal or neural foraminal stenosis. L3-4: Shallow disc bulge with superimposed right foraminal disc protrusion and facet degenerative change resulting in moderate narrowing of the right neural foramen. There is no significant spinal canal stenosis. L4-5: Disc bulge with superimposed right central disc protrusion no associated facet degenerative changes and ligamentum flavum hypertrophy resulting in mild spinal canal stenosis, mild right and mild to moderate left neural foraminal narrowing. L5-S1: Mild facet degenerative changes. No spinal canal or neural foraminal stenosis. IMPRESSION: 1. Large left psoas hematoma, similar in size compared to prior CT. 2. No evidence of discitis/osteomyelitis. 3. 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  Electronically Signed   By: Pedro Earls M.D.   On: 09/18/2019 15:30   IR Removal Tun Cv Cath W/O FL  Result Date: 09/03/2019 INDICATION: Patient with history of ESRD on HD s/p placement of tunneled right IJ hemodialysis catheter at [place] approximately 1 year ago per wife. Patient now with functioning AVF. Request is made for removal of tunneled hemodialysis catheter EXAM: REMOVAL OF TUNNELED HEMODIALYSIS CATHETER MEDICATIONS: 8 mL of 1% lidocaine COMPLICATIONS: None immediate. PROCEDURE: Informed written consent was obtained from the patient's wife, Roderic Sayer, following an explanation of the procedure, risks, benefits and alternatives to treatment. A time out was performed prior to the initiation of the procedure. Maximal barrier sterile technique was utilized including caps, mask, sterile gowns, sterile gloves, large sterile drape, hand hygiene, and Hibiclens. 1% lidocaine was injected under sterile conditions along the subcutaneous tunnel. Utilizing a combination of blunt dissection and gentle traction, the catheter was removed intact. Hemostasis was obtained with manual compression. A dressing was placed. The patient tolerated the procedure well without immediate post procedural complication. IMPRESSION: Successful removal of tunneled dialysis catheter. Read by: Earley Abide, PA-C Electronically Signed   By: Jacqulynn Cadet M.D.   On: 09/03/2019 15:34   DG CHEST PORT 1 VIEW  Result Date: 09/15/2019 CLINICAL DATA:  69 year old male admitted for respiratory distress. Positive COVID-19 in December. Trouble swallowing, dialysis patient. EXAM: PORTABLE CHEST 1 VIEW COMPARISON:  Chest radiographs 09/10/2019 and earlier. FINDINGS: Portable AP upright view at 0810 hours. Stable lung volumes with moderate elevation of the  left hemidiaphragm. Coarse bilateral pulmonary interstitial opacity with peripheral and slight basilar confluence is stable. Normal cardiac size and mediastinal contours.  Visualized tracheal air column is within normal limits. No pneumothorax or pleural effusion. Negative visible bowel gas pattern. No acute osseous abnormality identified. IMPRESSION: 1. Stable interstitial and patchy bilateral pulmonary opacity compatible with sequelae of COVID-19 pneumonia. 2. 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 Electronically Signed   By: Genevie Ann M.D.   On: 09/15/2019 08:33   ECHO TEE  Result Date: 09/18/2019    TRANSESOPHOGEAL ECHO REPORT   Patient Name:   Jonathan Frattini Sr. Date of Exam: 09/18/2019 Medical Rec #:  [id number]        Height:       71.0 in Accession #:    [id number]       Weight:       144.6 lb Date of Birth:  [birth year]        BSA:          1.84 m Patient Age:    41 years         BP:           121/65 mmHg Patient Gender: M                HR:           104 bpm. Exam Location:  Inpatient Procedure: Transesophageal Echo, Cardiac Doppler, Color Doppler and Saline            Contrast Bubble Study Indications:    bacteremia  History:        Patient has prior history of Echocardiogram examinations, most                 recent 08/01/2019. Stroke; Risk Factors:Hypertension, Diabetes                 and Former Smoker.  Sonographer:    Jannett Celestine RDCS (AE) Referring Phys: [id number] [place]: After discussion of the risks and benefits of a TEE, an informed consent was obtained from the patient. The transesophogeal probe was passed without difficulty through the esophogus of the patient. Sedation performed by different physician. The patient was monitored while under deep sedation. Anesthestetic sedation was provided intravenously by Anesthesiology: 150mg  of Propofol. Image quality was excellent. The patient's vital signs; including heart rate, blood pressure, and oxygen saturation; remained stable throughout the procedure. The patient developed no complications during the procedure. IMPRESSIONS  1. Left  ventricular ejection fraction, by estimation, is 30 to 35%. The left ventricle has severely decreased function. The left ventrical demonstrates regional wall motion abnormalities (see scoring diagram/findings for description). There is moderately increased left ventricular hypertrophy. Left ventricular diastolic function could not be evaluated. There is severe akinesis of the left ventricular, entire apical segment.  2. Right ventricular systolic function is normal. The right ventricular size is normal.  3. No left atrial/left atrial appendage thrombus was detected.  4. The mitral valve is normal in structure and function. Mild mitral valve regurgitation. No evidence of mitral stenosis.  5. The aortic valve is tricuspid. Aortic valve regurgitation is trivial . No aortic stenosis is present.  6. There is mild (Grade II) plaque involving the descending aorta.  7. Agitated saline contrast bubble study was negative, with no evidence of any interatrial shunt. Conclusion(s)/Recomendation(s): Normal biventricular function without evidence of hemodynamically significant valvular heart disease. No evidence of vegetation/infective endocarditis on this transesophageal echocardiogram. No  LA/LAA thrombus identified. Negative bubble study for interatrial shunt. No intracardiac source of embolism detected on this on this transesophageal echocardiogram. FINDINGS  Left Ventricle: Left ventricular ejection fraction, by estimation, is 30 to 35%. The left ventricle has severely decreased function. The left ventricle demonstrates regional wall motion abnormalities. Severe akinesis of the left ventricular, entire apical segment. There is moderately increased. There is moderately increased left ventricular hypertrophy. Left ventricular diastolic function could not be evaluated. Right Ventricle: The right ventricular size is normal. No increase in right ventricular wall thickness. Right ventricular systolic function is normal. Left Atrium:  Left atrial size was not assessed. No left atrial/left atrial appendage thrombus was detected. Right Atrium: Right atrial size was not assessed. Pericardium: There is no evidence of pericardial effusion. Mitral Valve: The mitral valve is normal in structure and function. Mild mitral valve regurgitation. No evidence of mitral valve stenosis. Tricuspid Valve: The tricuspid valve is normal in structure. Tricuspid valve regurgitation is trivial. There is no evidence of tricuspid valve vegetation. Aortic Valve: The aortic valve is tricuspid. Aortic valve regurgitation is trivial. No aortic stenosis is present. There is no evidence of aortic valve vegetation. Pulmonic Valve: The pulmonic valve was normal in structure. Pulmonic valve regurgitation is trivial. There is no evidence of pulmonic valve vegetation. Aorta: The aortic root and ascending aorta are structurally normal, with no evidence of dilitation. There is mild (Grade II) plaque involving the descending aorta. IAS/Shunts: No atrial level shunt detected by color flow Doppler. Agitated saline contrast was given intravenously to evaluate for intracardiac shunting. Agitated saline contrast bubble study was negative, with no evidence of any interatrial shunt. There  is no evidence of a patent foramen ovale. There is no evidence of an atrial septal defect. Buford Dresser MD Electronically signed by Buford Dresser MD Signature Date/Time: 09/18/2019/3:25:03 PM    Final    ECHOCARDIOGRAM 08/01/2019 IMPRESSIONS    1. Left ventricular ejection fraction, by visual estimation, is 30 to  35%. The left ventricle has moderate to severely decreased function. There  is no left ventricular hypertrophy.  2. Severe dyskinesis of the left ventricular, entire apical segment.  3. Apical left ventricular aneurysm.  4. Definity contrast agent was given IV to delineate the left ventricular  endocardial borders.  5. No intracardiac thrombi or masses were  visualized.  6. Left ventricular diastolic parameters are consistent with Grade I  diastolic dysfunction (impaired relaxation).  7. Global right ventricle has normal systolic function.The right  ventricular size is normal. No increase in right ventricular wall  thickness.  8. Left atrial size was normal.  9. Right atrial size was normal.  10. The mitral valve is normal in structure. Mild mitral valve  regurgitation. No evidence of mitral stenosis.  11. The tricuspid valve is normal in structure.  12. The aortic valve is normal in structure. Aortic valve regurgitation is  trivial. No evidence of aortic valve sclerosis or stenosis.  13. The pulmonic valve was normal in structure. Pulmonic valve  regurgitation is not visualized.  14. The inferior vena cava is normal in size with greater than 50%  respiratory variability, suggesting right atrial pressure of 3 mmHg.   FINDINGS  Left Ventricle: Left ventricular ejection fraction, by visual estimation,  is 30 to 35%. The left ventricle has moderate to severely decreased  function. Severe dyskinesis of the left ventricular, entire apical  segment. Definity contrast agent was given  IV to delineate the left ventricular endocardial borders. The left  ventricle demonstrates  regional wall motion abnormalities. There is no  left ventricular hypertrophy. Left ventricular diastolic parameters are  consistent with Grade I diastolic  dysfunction (impaired relaxation). Normal left atrial pressure. Apical  aneurysm can be seen.   Right Ventricle: The right ventricular size is normal. No increase in  right ventricular wall thickness. Global RV systolic function is has  normal systolic function.   Left Atrium: Left atrial size was normal in size.   Right Atrium: Right atrial size was normal in size   Pericardium: There is no evidence of pericardial effusion.   Mitral Valve: The mitral valve is normal in structure. Mild mitral valve   regurgitation. No evidence of mitral valve stenosis by observation.   Tricuspid Valve: The tricuspid valve is normal in structure. Tricuspid  valve regurgitation is not demonstrated.   Aortic Valve: The aortic valve is normal in structure. Aortic valve  regurgitation is trivial. The aortic valve is structurally normal, with no  evidence of sclerosis or stenosis.   Pulmonic Valve: The pulmonic valve was normal in structure. Pulmonic valve  regurgitation is not visualized. Pulmonic regurgitation is not visualized.   Aorta: The aortic root, ascending aorta and aortic arch are all  structurally normal, with no evidence of dilitation or obstruction.   Venous: The inferior vena cava is normal in size with greater than 50%  respiratory variability, suggesting right atrial pressure of 3 mmHg.   IAS/Shunts: No atrial level shunt detected by color flow Doppler. There is  no evidence of a patent foramen ovale. No ventricular septal defect is  seen or detected. There is no evidence of an atrial septal defect.   Additional Comments: No intracardiac thrombi or masses were visualized.      LV Volumes (MOD)  LV area d, A2C:  44.00 cm Diastology  LV area d, A4C:  43.50 cm LV e' lateral:  5.43 cm/s  LV area s, A2C:  34.10 cm LV E/e' lateral: 10.6  LV area s, A4C:  34.50 cm LV e' medial:  4.05 cm/s  LV major d, A2C:  9.28 cm  LV E/e' medial: 14.2  LV major d, A4C:  9.21 cm  LV major s, A2C:  8.39 cm  LV major s, A4C:  8.13 cm  LV vol d, MOD A2C: 173.0 ml  LV vol d, MOD A4C: 169.0 ml  LV vol s, MOD A2C: 113.0 ml  LV vol s, MOD A4C: 117.0 ml  LV SV MOD A2C:   60.0 ml  LV SV MOD A4C:   169.0 ml  LV SV MOD BP:   54.4 ml   RIGHT VENTRICLE  RV S prime:   11.40 cm/s  TAPSE (M-mode): 2.0 cm   LEFT ATRIUM       Index    RIGHT ATRIUM      Index  LA Vol (A2C):  66.6 ml 33.83 ml/m RA Area:   14.30 cm  LA Vol (A4C):  55.4 ml 28.14 ml/m RA Volume:   34.50 ml 17.52 ml/m  LA Biplane Vol: 62.2 ml 31.59 ml/m  AORTIC VALVE  LVOT Vmax:  91.40 cm/s  LVOT Vmean: 58.400 cm/s  LVOT VTI:  0.221 m   MITRAL VALVE  MV Area (PHT): 3.21 cm       SHUNTS  MV PHT:    68.44 msec      Systemic VTI: 0.22 m  MV Decel Time: 236 msec  MV E velocity: 57.40 cm/s 103 cm/s  MV A velocity: 82.30 cm/s 70.3 cm/s  MV E/A ratio: 0.70    1.5    TEE LEFT VENTRICLE: EF =30-35%. At least moderate LVH. Apical hypokinesis with aneurysm  RIGHT VENTRICLE: Normal size and function.   LEFT ATRIUM: No thrombus/mass.  LEFT ATRIAL APPENDAGE: No thrombus/mass.   RIGHT ATRIUM: No thrombus/mass.  AORTIC VALVE: Trileaflet. Trivialregurgitation. No vegetation.  MITRAL VALVE: Normal structure. Mildregurgitation. No vegetation.  TRICUSPID VALVE: Normal structure.Trivialregurgitation. No vegetation.  PULMONIC VALVE: Grossly normal structure.Trivialregurgitation. No apparent vegetation.  INTERATRIAL SEPTUM: No PFO or ASD seen by color Doppler.Negative bubble study for right to left shunt.  PERICARDIUM: No effusion noted.  DESCENDING AORTA:Milddiffuse plaque seen   CONCLUSION:no cardiac source of embolism    Subjective: Seen and examined while he was in dialysis and he had no complaints and states that he had no pain.  Was doing well slept okay.  No nausea or vomiting.  Denies any other concerns or points at this time is more pleasant and reviewed today than it was yesterday.  Stable to be discharged to a skilled nursing facility today.  Nephrology is writing for new dialysis orders for [facility].  Discharge Exam: Vitals:   09/21/19 1200 09/21/19 1300  BP: 137/75 118/68  Pulse: 92 96  Resp: 16   Temp: 97.8 F (36.6 C) 97.7 F (36.5 C)  SpO2: 96% 100%   Vitals:   09/21/19 1130 09/21/19 1150 09/21/19 1200 09/21/19 1300  BP: 123/74 122/66 137/75 118/68  Pulse: 90 92 92 96  Resp:   16   Temp:   97.8 F (36.6  C) 97.7 F (36.5 C)  TempSrc:   Oral Oral  SpO2:   96% 100%  Weight:   65.8 kg   Height:       General: Pt is alert, awake, not in acute distress Cardiovascular: RRR, S1/S2 +, no rubs, no gallops Respiratory: Mildly diminished bilaterally, no wheezing, no rhonchi; unlabored breathing  Abdominal: Soft, NT, ND, bowel sounds + Extremities: no appreciable edema, no cyanosis  The results of significant diagnostics from this hospitalization (including imaging, microbiology, ancillary and laboratory) are listed below for reference.    Microbiology: Recent Results (from the past 240 hour(s))  Culture, blood (single)     Status: None   Collection Time: 09/15/19 10:16 AM   Specimen: BLOOD LEFT HAND  Result Value Ref Range Status   Specimen Description BLOOD LEFT HAND  Final   Special Requests   Final    AEROBIC BOTTLE ONLY Blood Culture results may not be optimal due to an inadequate volume of blood received in culture bottles   Culture   Final    NO GROWTH 5 DAYS Performed at [facility], [place] [address], [place], [place] [address]    Report Status 09/20/2019 FINAL  Final  SARS CORONAVIRUS 2 (TAT 6-24 HRS) Nasopharyngeal Nasopharyngeal Swab     Status: None   Collection Time: 09/16/19  1:55 PM   Specimen: Nasopharyngeal Swab  Result Value Ref Range Status   SARS Coronavirus 2 NEGATIVE NEGATIVE Final    Comment: (NOTE) SARS-CoV-2 target nucleic acids are NOT DETECTED. The SARS-CoV-2 RNA is generally detectable in upper and lower respiratory specimens during the acute phase of infection. Negative results do not preclude SARS-CoV-2 infection, do not rule out co-infections with other pathogens, and should not be used as the sole basis for treatment or other patient management decisions. Negative results must be combined with clinical observations, patient history, and epidemiological information. The expected result is Negative. Fact Sheet for  Patients: SugarRoll.be Fact Sheet for Healthcare Providers: https://www.woods-mathews.com/ This test is not yet approved or cleared by the [place] FDA and  has been authorized for detection and/or diagnosis of SARS-CoV-2 by FDA under an Emergency Use Authorization (EUA). This EUA will remain  in effect (meaning this test can be used) for the duration of the COVID-19 declaration under Section 56 4(b)(1) of the Act, 21 U.S.C. section 360bbb-3(b)(1), unless the authorization is terminated or revoked sooner. Performed at [facility], [place] [address] [place], [place] [address]   SARS CORONAVIRUS 2 (TAT 6-24 HRS)     Status: None   Collection Time: 09/19/19  4:00 AM  Result Value Ref Range Status   SARS Coronavirus 2 NEGATIVE NEGATIVE Final    Comment: (NOTE) SARS-CoV-2 target nucleic acids are NOT DETECTED. The SARS-CoV-2 RNA is generally detectable in upper and lower respiratory specimens during the acute phase of infection. Negative results do not preclude SARS-CoV-2 infection, do not rule out co-infections with other pathogens, and should not be used as the sole basis for treatment or other patient management decisions. Negative results must be combined with clinical observations, patient history, and epidemiological information. The expected result is Negative. Fact Sheet for Patients: SugarRoll.be Fact Sheet for Healthcare Providers: https://www.woods-mathews.com/ This test is not yet approved or cleared by the [place] FDA and  has been authorized for detection and/or diagnosis of SARS-CoV-2 by FDA under an Emergency Use Authorization (EUA). This EUA will remain  in effect (meaning this test can be used) for the duration of the COVID-19 declaration under Section 56 4(b)(1) of the Act, 21 U.S.C. section 360bbb-3(b)(1), unless the authorization is terminated or revoked  sooner. Performed at [facility], [place] [address], [place], [place] [address]     Labs: BNP (last 3 results) Recent Labs    07/30/19 1843  BNP [id number]*   Basic Metabolic Panel: Recent Labs  Lab 09/15/19 0713 09/15/19 0741 09/17/19 0530 09/18/19 0518 09/18/19 1045 09/19/19 0430 09/20/19 0333 09/21/19 0517  NA   < >  --  131* 137  --  135 136 135  K   < >  --  5.0 4.0  --  4.5 3.9 4.8  CL   < >  --  92* 96*  --  91* 94* 93*  CO2   < >  --  25 28  --  25 28 24   GLUCOSE   < >  --  223* 91  --  175* 223* 165*  BUN   < >  --  55* 32*  --  47* 30* 42*  CREATININE   < >  --  7.87* 5.97*  --  7.63* 5.43* 7.15*  CALCIUM   < >  --  8.4* 8.6*  --  8.9 8.8* 9.1  MG  --  1.9  --   --  2.1  --  2.0 2.2  PHOS   < >  --  3.3 3.7  --  3.7 3.4 4.7*   < > = values in this interval not displayed.   Liver Function Tests: Recent Labs  Lab 09/15/19 0741 09/17/19 0530 09/18/19 0518 09/18/19 1045 09/19/19 0430 09/20/19 0333 09/21/19 0517  AST 20  --   --  22  --  19  --   ALT 26  --   --  22  --  21  --   ALKPHOS 65  --   --  72  --  76  --  BILITOT 0.4  --   --  0.7  --  0.5  --   PROT 6.9  --   --  6.8  --  6.6  --   ALBUMIN  --    < > 2.5* 2.4* 2.4* 2.5* 2.7*   < > = values in this interval not displayed.   No results for input(s): LIPASE, AMYLASE in the last 168 hours. No results for input(s): AMMONIA in the last 168 hours. CBC: Recent Labs  Lab 09/15/19 0713 09/17/19 0530 09/17/19 1239 09/18/19 1045 09/19/19 0658 09/20/19 0333 0000000 [id number]  WBC DUPLICATE REQUEST  8.0   < > 6.7 9.2 9.1 9.2 8.9  NEUTROABS 5.4  --   --  5.9  --  6.0 5.9  HGB DUPLICATE REQUEST  [id number]*   < > 9.5* 10.7* 10.1* 0000000* 0000000*  HCT DUPLICATE REQUEST  [id number]*   < > 30.9* 35.2* 33.5* [id number]* [id number]*  MCV DUPLICATE REQUEST  [id number]*   < > 102.7* 102.9* 102.4* 102.9* [id number]*  PLT DUPLICATE REQUEST  [id number]   < > 276 289 278 268 290   < > = values in this interval not displayed.   Cardiac Enzymes: No  results for input(s): CKTOTAL, CKMB, CKMBINDEX, TROPONINI in the last 168 hours. BNP: Invalid input(s): POCBNP CBG: Recent Labs  Lab 09/20/19 1113 09/20/19 1620 09/20/19 2109 09/21/19 0651 09/21/19 1256  GLUCAP 193* 142* 114* 137* 107*   D-Dimer No results for input(s): DDIMER in the last 72 hours. Hgb A1c No results for input(s): HGBA1C in the last 72 hours. Lipid Profile No results for input(s): CHOL, HDL, LDLCALC, TRIG, CHOLHDL, LDLDIRECT in the last 72 hours. Thyroid function studies No results for input(s): TSH, T4TOTAL, T3FREE, THYROIDAB in the last 72 hours.  Invalid input(s): FREET3 Anemia work up No results for input(s): VITAMINB12, FOLATE, FERRITIN, TIBC, IRON, RETICCTPCT in the last 72 hours. Urinalysis    Component Value Date/Time   COLORURINE AMBER (A) 09/11/2019 0534   APPEARANCEUR TURBID (A) 09/11/2019 0534   LABSPEC 1.012 09/11/2019 0534   PHURINE 8.0 09/11/2019 0534   GLUCOSEU NEGATIVE 09/11/2019 0534   HGBUR LARGE (A) 09/11/2019 0534   BILIRUBINUR NEGATIVE 09/11/2019 [place] 09/11/2019 0534   PROTEINUR 100 (A) 09/11/2019 0534   NITRITE NEGATIVE 09/11/2019 0534   LEUKOCYTESUR LARGE (A) 09/11/2019 0534   Sepsis Labs Invalid input(s): PROCALCITONIN,  WBC,  LACTICIDVEN Microbiology Recent Results (from the past 240 hour(s))  Culture, blood (single)     Status: None   Collection Time: 09/15/19 10:16 AM   Specimen: BLOOD LEFT HAND  Result Value Ref Range Status   Specimen Description BLOOD LEFT HAND  Final   Special Requests   Final    AEROBIC BOTTLE ONLY Blood Culture results may not be optimal due to an inadequate volume of blood received in culture bottles   Culture   Final    NO GROWTH 5 DAYS Performed at [facility], [place] [address], [place], [place] [address]    Report Status 09/20/2019 FINAL  Final  SARS CORONAVIRUS 2 (TAT 6-24 HRS) Nasopharyngeal Nasopharyngeal Swab     Status: None   Collection Time: 09/16/19  1:55  PM   Specimen: Nasopharyngeal Swab  Result Value Ref Range Status   SARS Coronavirus 2 NEGATIVE NEGATIVE Final    Comment: (NOTE) SARS-CoV-2 target nucleic acids are NOT DETECTED. The SARS-CoV-2 RNA is generally detectable in upper and lower respiratory specimens during the acute phase of infection.  Negative results do not preclude SARS-CoV-2 infection, do not rule out co-infections with other pathogens, and should not be used as the sole basis for treatment or other patient management decisions. Negative results must be combined with clinical observations, patient history, and epidemiological information. The expected result is Negative. Fact Sheet for Patients: SugarRoll.be Fact Sheet for Healthcare Providers: https://www.woods-mathews.com/ This test is not yet approved or cleared by the [place] FDA and  has been authorized for detection and/or diagnosis of SARS-CoV-2 by FDA under an Emergency Use Authorization (EUA). This EUA will remain  in effect (meaning this test can be used) for the duration of the COVID-19 declaration under Section 56 4(b)(1) of the Act, 21 U.S.C. section 360bbb-3(b)(1), unless the authorization is terminated or revoked sooner. Performed at [facility], [place] [address], [place], [place] [address]   SARS CORONAVIRUS 2 (TAT 6-24 HRS)     Status: None   Collection Time: 09/19/19  4:00 AM  Result Value Ref Range Status   SARS Coronavirus 2 NEGATIVE NEGATIVE Final    Comment: (NOTE) SARS-CoV-2 target nucleic acids are NOT DETECTED. The SARS-CoV-2 RNA is generally detectable in upper and lower respiratory specimens during the acute phase of infection. Negative results do not preclude SARS-CoV-2 infection, do not rule out co-infections with other pathogens, and should not be used as the sole basis for treatment or other patient management decisions. Negative results must be combined with clinical  observations, patient history, and epidemiological information. The expected result is Negative. Fact Sheet for Patients: SugarRoll.be Fact Sheet for Healthcare Providers: [email or web address]-mathews.[email or web address] This test is not yet approved or cleared by the [place] FDA and  has been authorized for detection and/or diagnosis of SARS-CoV-2 by FDA under an Emergency Use Authorization (EUA). This EUA will remain  in effect (meaning this test can be used) for the duration of the COVID-19 declaration under Section 56 4(b)(1) of the Act, 21 U.S.C. section 360bbb-3(b)(1), unless the authorization is terminated or revoked sooner. Performed at [facility], [place] [address], [place], [place] [address]    Time coordinating discharge: 35 minutes  SIGNED:  Kerney Elbe, DO [facility] 09/21/2019, 4:07 PM Pager is on [phone]  If 7PM-7AM, please contact [email or web address] [email or web address] Password [email or web address]

## 2019-09-22 LAB — GLUCOSE, CAPILLARY
Glucose-Capillary: 148 mg/dL — ABNORMAL HIGH (ref 70–99)
Glucose-Capillary: 198 mg/dL — ABNORMAL HIGH (ref 70–99)

## 2019-09-22 LAB — RENAL FUNCTION PANEL
Albumin: 2.5 g/dL — ABNORMAL LOW (ref 3.5–5.0)
Anion gap: 12 (ref 5–15)
BUN: 25 mg/dL — ABNORMAL HIGH (ref 8–23)
CO2: 25 mmol/L (ref 22–32)
Calcium: 8.7 mg/dL — ABNORMAL LOW (ref 8.9–10.3)
Chloride: 99 mmol/L (ref 98–111)
Creatinine, Ser: 4.92 mg/dL — ABNORMAL HIGH (ref 0.61–1.24)
GFR calc Af Amer: 13 mL/min — ABNORMAL LOW (ref >60)
GFR calc non Af Amer: 11 mL/min — ABNORMAL LOW (ref >60)
Glucose, Bld: 151 mg/dL — ABNORMAL HIGH (ref 70–99)
Phosphorus: 2.9 mg/dL (ref 2.5–4.6)
Potassium: 4.2 mmol/L (ref 3.5–5.1)
Sodium: 136 mmol/L (ref 135–145)

## 2019-09-22 LAB — PROTIME-INR
INR: 1.8 — ABNORMAL HIGH (ref 0.8–1.2)
Prothrombin Time: 21 seconds — ABNORMAL HIGH (ref 11.4–15.2)

## 2019-09-22 MED ORDER — WARFARIN SODIUM 7.5 MG PO TABS: 7.5000 mg | ORAL_TABLET | Freq: Once | ORAL | Status: DC

## 2019-09-22 NOTE — Discharge Summary (Addendum)
Physician Discharge Summary  Jonathan Vale Sr. [id number] DOB: [birth year] DOA: 07/30/2019  PCP: Marton Redwood, MD  Admit date: 07/30/2019 Discharge date: 09/22/2019  Admitted From: Home Disposition: SNF  Recommendations for Outpatient Follow-up:  1. Follow up with PCP in 1-2 weeks 2. Follow up with Nephrology within 1 week 3. Follow up with Cardiology within 1-2 weeks 4. Follow up with Neurology within 2-4 weeks 5. Follow up with Infectious Diseases if Necessary 6. Follow up with Orthopedic Surgery if Necessary 7. Please obtain CMP/CBC, Mag, Phos in one week 8. Repeat PT-INR within 1 week and Adjust Coumadin as Necessary 9. 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 MRI brain showed small infarct in the left pons left occiput. Neurology saw the patient and felt this was secondary to left ventricular thrombus ( 2D echo did not show any clots or PFO); he was started on heparin. He was diagnosed with Covid infection on 07/24/2019 and was treated for Infection at that time. Patient has recurrently tested positive -probably positivity from his index infection in December but now has tested Negative Twice.  Hospital course complicated by fever spikes on 1/30, 2/1,2/3 , 2/5 and 2/6 (102.20F) which has now resolved.  Infectious diseases evaluated and ordered MRI and recommending monitoring off of antibiotics.  Hospitalization is further been complicated by left psoas hematoma.  TEE was done on 2/10 and  showed no evidence of any cardiac embolism with no evidence of any clot.  Orthopedics evaluated his hematoma and recommending no current intervention. PT OT recommending SNF and he can return to his regular dialysis center now that he is tested negative twice for COVID-19 disease.  Patient appears stable for discharge from a medical perspective though he was a little confused on the morning of 09/20/19 and only oriented to himself and agitated when I told him that the year was not 2003 and that it was 2021 and he told me to leave him alone then and stated that he did not like me. He was seen in Dialysis on 09/21/19 and had no complaints.   ADDENDUM 2/14: Stable for discharge to skilled nursing facility yesterday however did not go because family had to fill out paperwork at the SNF.  Patient is still stable for discharge and will be sent out on his original plan of 4 mg of Coumadin daily.  His INR did drop to 1.8 this morning but he is stable for discharge and can be followed at the SNF for his Coumadin checks.  Discharge Diagnoses:  Principal Problem:   FUO (fever of unknown origin) Active Problems:   Chronic hepatitis C without hepatic coma ([name])   Diabetes mellitus type 2, uncomplicated ([name])   History of coronary artery disease   Hypertension   ESRD (end stage renal disease) on dialysis ([name])   Acute respiratory failure due to COVID-19 [facility])   Acute respiratory failure with hypoxia ([name])   Cerebral thrombosis with cerebral infarction   Cerebral embolism with cerebral infarction  Acute left pons and left occipital strokes -Etiology unclear but though to be  secondary to Covid induced hypercoaguablilty versus LV thrombus. -Neurology saw the patient and felt this was secondary to left ventricular thrombus;  -He was started on heparin which had been changed to Eliquis and now Coumadin and his INR was therapeutic at 2.5 yesterday but dropped to 1.8 today; continue with Coumadin 4 mg p.o. daily with a  PT/INR check within 3 days and will be given a 7.5 mg dosing tonight -Continue ASA, atorvastatin 40 mg p.o. daily, Eliquis changed to coumadin;  -Coumadin per Pharmacy Dosing  -SNF placement recommended by PT and currently there is no facility for the patient to go to but he is now tested negative twice for COVID-19 disease and can return to his regular dialysis center; social worker consulted for assistance with placement and are looking to [facility] and he was accepted and is able to be discharged -Will place on Delirium Precautions -The day before yesterday was alert and oriented x1 today and became extremely agitated and upset when I asked him orientation questions and told him the correct responses; Yesterday he was more calm and slightly improved  Fevers, improved -Secondary to aspiration vs acute infections but now improved  -.Urine culture NGTD. However, patient received Rocephin x 5 daysfor presumed UTI/aspiration during HC.  -Blood cultures1/30, 2/7after fevers, negative to date. 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  -HD catheter removed 1/26 and site looks okay with no redness but has mild draiange.  -Given transplant h/o and concern for recurrent fevers, consulted ID.  -Consult speech to r/o ongoing aspiration.  -CT chest /abd/pelvis ordered(changed to WO contrast as radiology woundnt give contrast if patient been on dialysis for <14months)per d/w Dr Tommy Medal and showed "Left psoas intramuscular hematoma. Multifocal pneumonia, likely viral etiology including COVID-19. Clinical correlation is recommended. 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 Cardiology consult and further evaluation with echocardiogram recommended.  Coronary vascular calcification and Aortic Atherosclerosis. Cholelithiasis. No hydronephrosis or nephrolithiasis. Right lower quadrant transplant kidney appears slightly atrophic. Mildly thickened bladder wall. Correlation with urinalysis recommended to exclude UTI." -MRI ordered due concern for Abscesses and showed "Large left psoas hematoma, similar in size compared to prior CT. No evidence of discitis/osteomyelitis. Degenerative disc disease at L3-4 and L4-5 as described above, worst at L4-5 where there is mild spinal canal stenosis, mild right and mild to moderate left neural foraminal narrowing." -Infectious disease evaluated and recommends watching off of antibiotics for now he has been afebrile for greater than 72 hours. -Patient has been afebrile and is stable  Large Left Psoas Hematoma -Continue with anticoagulation this point given his left ventricular thrombus and need for anticoagulation -hemoglobin/hematocrit remains relatively stable at 10.7/34.7 -Discussed with Dr. Percell Miller of orthopedics who will formally see the patient in the morning and recommends currently no indication for drainage at this point they currently would not recommend any intervention at this time given that is unlikely to be infected given his lack of symptomatology   Recent COVID-19 pneumonia -Initially tested positive in December 2020 and completed appropriate treatment.  -No longer infectious and off of precautions at this point.  -Assumed that patient could continue to test positive for 90 days from his initial positive test. -SNF wants 2 Negative Tests prior to D/C -SpO2: 97 % O2 Flow Rate (L/min): 2 L/min COVID-19 Labs  No results for input(s):  DDIMER, FERRITIN, LDH, CRP in the last 72 hours.  Lab Results  Component Value Date   SARSCOV2NAA NEGATIVE 09/19/2019   SARSCOV2NAA NEGATIVE 09/16/2019   SARSCOV2NAA POSITIVE (A) 08/22/2019   SARSCOV2NAA POSITIVE (A) 07/24/2019   -Continue monitor chest  x-ray intermittently but currently is improved from a respiratory standpoint -Continue Xopenex 2 puffs elations every 8 hours as needed for wheezing  ?LV thrombus -Not seen on echo, not sure if TEE deferred due to [place].  -Coumadin per pharmacy and will discharge on 4 mg daily with an INR check in 3 days; patient is to receive 7.5 mg dosing tonight -TEE done and showed no hemipericardium and there is no cardiac source of embolism noted  ESRD/HD TTSa Elevated Anion Gap Hyperphosphatemia  -Nephrology following. Per renal patient [facility]ze at [facility] until COVID tests negative (Tested Negative on 09/16/19 and again yesterday 09/18/2019) -Patient's BUN/creatinine went from 55/7.87 -> 32/5.97 -> 47/7.63 -> 30/5.43 -> 42/7.15 yesterday prior to Dialysis  -Continue with renal/carb modified diet with 1200 mL fluid restriction -AG was 18 -Phos Level went from 3.4 -> 4.7 and expect to correct with Dialysis  -Nephrology continuing Darbepoetin alpha 150 mcg every Tuesday for hemodialysis -Continue with Rena-Vit -Patient underwent Dialysis today and was doing well.    Prior cadaveric renal transplant -Continue prednisone. Fever w/u as above and his fevers have improved   Hyperglycemia, Pre-Diabetes -Continue moderate NovoLog sliding scale insulin before meals and at bedtime -CBGs ranging from 107-315 -Continue with home NovoLog sliding scale and have reduced his home Tresiba from 12 units to 6 units and can have further adjustments at the SNF  HTN -Currently his losartan 25 g p.o. daily has been held but can be resumed at discharge -He is on metoprolol succinate 50 mg p.o. daily but this was held and will resume -Continue to monitor blood pressures very carefully and Last BP was 112/66  Macrocytic Anemia -Patient's Hb/Hct is now 10.7/34.7 and relatively stable  -MCV was 101.8 -Continue to Monitor for S/Sx of Bleeding; Currently no Active bleeding noted -Repeat CBC in AM   Discharge  Instructions  Discharge Instructions    Call MD for:  difficulty breathing, headache or visual disturbances   Complete by: As directed    Call MD for:  extreme fatigue   Complete by: As directed    Call MD for:  hives   Complete by: As directed    Call MD for:  persistant dizziness or light-headedness   Complete by: As directed    Call MD for:  persistant nausea and vomiting   Complete by: As directed    Call MD for:  redness, tenderness, or signs of infection (pain, swelling, redness, odor or green/yellow discharge around incision site)   Complete by: As directed    Call MD for:  severe uncontrolled pain   Complete by: As directed    Call MD for:  temperature >100.4   Complete by: As directed    Diet - low sodium heart healthy   Complete by: As directed    RENAL Diet with 1200 mL Fluid Restriction   Discharge instructions   Complete by: As directed    You were cared for by a hospitalist during your hospital stay. If you have any questions about your discharge medications or the care you received while you were in the hospital after you are discharged, you can call the unit and ask to speak with the hospitalist on call if the hospitalist that took care  of you is not available. Once you are discharged, your primary care physician will handle any further medical issues. Please note that NO REFILLS for any discharge medications will be authorized once you are discharged, as it is imperative that you return to your primary care physician (or establish a relationship with a primary care physician if you do not have one) for your aftercare needs so that they can reassess your need for medications and monitor your lab values.  Follow up with PCP, Neurology, Nephrology, Cardiology, and with Orthopedic Surgery (if needed). Take all medications as prescribed. If symptoms change or worsen please return to the ED for evaluation   Increase activity slowly   Complete by: As directed      Allergies  as of 09/22/2019   No Known Allergies     Medication List    STOP taking these medications   apixaban 5 MG Tabs tablet Commonly known as: ELIQUIS     TAKE these medications   acetaminophen 325 MG tablet Commonly known as: TYLENOL Take 2 tablets (650 mg total) by mouth every 6 (six) hours as needed for mild pain (or Fever >/= 101).   allopurinol 100 MG tablet Commonly known as: ZYLOPRIM Take 100 mg by mouth daily.   aspirin 81 MG chewable tablet Chew 81 mg by mouth daily.   atorvastatin 40 MG tablet Commonly known as: LIPITOR Take 40 mg by mouth daily.   BD Pen Needle Nano U/F 32G X 4 MM Misc Generic drug: Insulin Pen Needle U TO INJECT 5 TIMES A DAY   calcium acetate 667 MG capsule Commonly known as: PHOSLO Take 1,334 mg by mouth 3 (three) times daily with meals.   feeding supplement (NEPRO CARB STEADY) Liqd Take 237 mLs by mouth 3 (three) times daily between meals.   feeding supplement (PRO-STAT SUGAR FREE 64) Liqd Take 30 mLs by mouth 2 (two) times daily.   glucagon (human recombinant) 1 MG injection Commonly known as: GLUCAGEN Inject 1 mg into the muscle once as needed for low blood sugar.   levalbuterol 45 MCG/ACT inhaler Commonly known as: XOPENEX HFA Inhale 2 puffs into the lungs every 8 (eight) hours as needed for wheezing.   lidocaine-prilocaine cream Commonly known as: EMLA Apply 1 application topically 3 (three) times a week. 30 minutes prior to Dialysis   losartan 25 MG tablet Commonly known as: COZAAR Take 25 mg by mouth daily.   metoCLOPramide 5 MG tablet Commonly known as: REGLAN Take 5 mg by mouth 3 (three) times daily with meals.   metoprolol succinate 50 MG 24 hr tablet Commonly known as: TOPROL-XL Take 50 mg by mouth daily.   NovoLOG FlexPen 100 UNIT/ML FlexPen Generic drug: insulin aspart Inject 0-10 Units into the skin 2 (two) times daily before a meal. Per sliding scale   ondansetron 4 MG tablet Commonly known as: ZOFRAN Take  1 tablet (4 mg total) by mouth every 6 (six) hours as needed for nausea.   pantoprazole 40 MG tablet Commonly known as: PROTONIX Take 1 tablet (40 mg total) by mouth daily.   predniSONE 5 MG tablet Commonly known as: DELTASONE Take 5 mg by mouth daily with breakfast.   Rena-Vite Rx 1 MG Tabs Take 1 tablet by mouth daily.   senna 8.6 MG tablet Commonly known as: SENOKOT Take 1 tablet by mouth as needed for constipation.   sevelamer carbonate 2.4 g Pack Commonly known as: RENVELA Take 2.4 g by mouth 3 (three) times daily with meals.  Tyler Aas FlexTouch 100 UNIT/ML Sopn FlexTouch Pen Generic drug: insulin degludec Inject 0.06 mLs (6 Units total) into the skin daily. What changed: how much to take   warfarin 4 MG tablet Commonly known as: COUMADIN Take 1 tablet (4 mg total) by mouth daily at 6 PM.     ASK your doctor about these medications    stroke: mapping our early stages of recovery book Misc 1 each by Does not apply route once for 1 dose. Ask about: Should I take this medication?       No Known Allergies  Consultations:  Cardiology  Infectious Diseases   Nephrology   Orthopedic Surgery   Procedures/Studies: CT ABDOMEN PELVIS WO CONTRAST  Result Date: 09/17/2019 CLINICAL DATA:  69 year old male with abdominal pain and fever. History of COVID-19. EXAM: CT CHEST, ABDOMEN AND PELVIS WITHOUT CONTRAST TECHNIQUE: Multidetector CT imaging of the chest, abdomen and pelvis was performed following the standard protocol without IV contrast. COMPARISON:  Chest radiograph dated 09/15/2019. FINDINGS: Evaluation of this exam is limited in the absence of intravenous contrast. CT CHEST FINDINGS Cardiovascular: There is no cardiomegaly. There is a high attenuating pericardial effusion adjacent to the left ventricle measuring approximately 13 mm in thickness. This may represent hemorrhagic fluid. Clinical correlation and further evaluation with echocardiogram recommended. 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 Three vessel coronary vascular calcification noted. There is atherosclerotic calcification of thoracic aorta. The central pulmonary arteries are grossly unremarkable. Mediastinum/Nodes: No hilar or mediastinal adenopathy. The esophagus and the thyroid gland are grossly unremarkable. No mediastinal fluid collection. 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 Bilateral hazy and streaky densities most concerning for atypical infection and in keeping with history of COVID-19. Clinical correlation is recommended. No lobar consolidation, pleural effusion, or pneumothorax. The central airways are patent. Musculoskeletal: No chest wall mass or suspicious bone lesions identified. CT ABDOMEN PELVIS FINDINGS No intra-abdominal free air or free fluid. Hepatobiliary: The liver is unremarkable. No intrahepatic biliary ductal dilatation. There are multiple small stones in the gallbladder. No pericholecystic fluid or evidence of acute cholecystitis by CT. Ultrasound may provide better evaluation. Pancreas: Unremarkable. No pancreatic ductal dilatation or surrounding inflammatory changes. Spleen: Normal in size without focal abnormality. Adrenals/Urinary Tract: The adrenal glands are unremarkable. Moderate bilateral renal parenchyma atrophy. There is no hydronephrosis or nephrolithiasis on either side. Small bilateral renal hypodense lesions are suboptimally characterized on this noncontrast CT. The visualized ureters appear unremarkable. The urinary bladder is minimally distended. There is apparent diffuse thickening of the bladder wall which may be partly related to underdistention. Cystitis is not excluded. Correlation with urinalysis recommended. 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 There is no hydronephrosis or nephrolithiasis of the transplant kidney. No peritransplant fluid collection identified. The transplant kidney however appears  slightly small. Stomach/Bowel: Moderate amount of stool noted throughout the colon. Small scattered colonic diverticula without active inflammatory changes noted. There is no bowel obstruction or active inflammation. Normal appendix. Vascular/Lymphatic: Advanced aortoiliac atherosclerotic disease. The IVC is unremarkable. No portal venous gas. There is no adenopathy. Reproductive: The prostate and seminal vesicles are grossly unremarkable. Other: There is a left psoas intramuscular hematoma measuring 5.8 x 5.0 cm in greatest axial dimensions and approximately 10 cm in craniocaudal length. Musculoskeletal: Degenerative changes of the spine primarily at L4-L5. No acute osseous pathology. IMPRESSION: 1. Left psoas intramuscular hematoma. 2. Multifocal pneumonia, likely viral etiology including COVID-19. Clinical correlation is recommended. 3. Aneurysm of the left ventricular apex  and a high attenuating pericardial effusion, possibly hemopericardium. Cardiology consult and further evaluation with echocardiogram recommended. 4. Coronary vascular calcification and Aortic Atherosclerosis (ICD10-I70.0). 5. Cholelithiasis. 6. No hydronephrosis or nephrolithiasis. Right lower quadrant transplant kidney appears slightly atrophic. 7. Mildly thickened bladder wall. Correlation with urinalysis recommended to exclude UTI. These results were called by telephone at the time of interpretation on 09/17/2019 at 9:39 pm to patient's nurse Ruthann Cancer, who verbally acknowledged these results. Electronically Signed   By: Anner Crete M.D.   On: 09/17/2019 21:50   DG Chest 2 View  Result Date: 09/10/2019 CLINICAL DATA:  Fever for 1 day EXAM: CHEST - 2 VIEW COMPARISON:  07/30/2019, 07/24/2019 FINDINGS: Frontal and lateral views of the chest demonstrate chronic elevation left hemidiaphragm. Diffuse interstitial opacities are again noted, with slight improvement in the bibasilar ground-glass airspace disease seen previously. No effusion or  pneumothorax. No acute bony abnormalities. IMPRESSION: 1. Persistent but slightly improving bibasilar ground-glass airspace disease consistent with multifocal pneumonia. Electronically Signed   By: Randa Ngo M.D.   On: 09/10/2019 09:00   CT CHEST WO CONTRAST  Result Date: 09/17/2019 CLINICAL DATA:  69 year old male with abdominal pain and fever. History of COVID-19. EXAM: CT CHEST, ABDOMEN AND PELVIS WITHOUT CONTRAST TECHNIQUE: Multidetector CT imaging of the chest, abdomen and pelvis was performed following the standard protocol without IV contrast. COMPARISON:  Chest radiograph dated 09/15/2019. FINDINGS: Evaluation of this exam is limited in the absence of intravenous contrast. CT CHEST FINDINGS Cardiovascular: There is no cardiomegaly. There is a high attenuating pericardial effusion adjacent to the left ventricle measuring approximately 13 mm in thickness. This may represent hemorrhagic fluid. Clinical correlation and further evaluation with echocardiogram recommended. 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 Three vessel coronary vascular calcification noted. There is atherosclerotic calcification of thoracic aorta. The central pulmonary arteries are grossly unremarkable. Mediastinum/Nodes: No hilar or mediastinal adenopathy. The esophagus and the thyroid gland are grossly unremarkable. No mediastinal fluid collection. 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 Bilateral hazy and streaky densities most concerning for atypical infection and in keeping with history of COVID-19. Clinical correlation is recommended. No lobar consolidation, pleural effusion, or pneumothorax. The central airways are patent. Musculoskeletal: No chest wall mass or suspicious bone lesions identified. CT ABDOMEN PELVIS FINDINGS No intra-abdominal free air or free fluid. Hepatobiliary: The liver is unremarkable. No intrahepatic biliary ductal dilatation. There are multiple small  stones in the gallbladder. No pericholecystic fluid or evidence of acute cholecystitis by CT. Ultrasound may provide better evaluation. Pancreas: Unremarkable. No pancreatic ductal dilatation or surrounding inflammatory changes. Spleen: Normal in size without focal abnormality. Adrenals/Urinary Tract: The adrenal glands are unremarkable. Moderate bilateral renal parenchyma atrophy. There is no hydronephrosis or nephrolithiasis on either side. Small bilateral renal hypodense lesions are suboptimally characterized on this noncontrast CT. The visualized ureters appear unremarkable. The urinary bladder is minimally distended. There is apparent diffuse thickening of the bladder wall which may be partly related to underdistention. Cystitis is not excluded. Correlation with urinalysis recommended. 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 There is no hydronephrosis or nephrolithiasis of the transplant kidney. No peritransplant fluid collection identified. The transplant kidney however appears slightly small. Stomach/Bowel: Moderate amount of stool noted throughout the colon. Small scattered colonic diverticula without active inflammatory changes noted. There is no bowel obstruction or active inflammation. Normal appendix. Vascular/Lymphatic: Advanced aortoiliac atherosclerotic disease. The IVC is unremarkable. No portal venous gas. There is no adenopathy. Reproductive: The prostate  and seminal vesicles are grossly unremarkable. Other: There is a left psoas intramuscular hematoma measuring 5.8 x 5.0 cm in greatest axial dimensions and approximately 10 cm in craniocaudal length. Musculoskeletal: Degenerative changes of the spine primarily at L4-L5. No acute osseous pathology. IMPRESSION: 1. Left psoas intramuscular hematoma. 2. Multifocal pneumonia, likely viral etiology including COVID-19. Clinical correlation is recommended. 3. 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 Cardiology consult and further evaluation with echocardiogram recommended. 4. Coronary vascular calcification and Aortic Atherosclerosis (ICD10-I70.0). 5. Cholelithiasis. 6. No hydronephrosis or nephrolithiasis. Right lower quadrant transplant kidney appears slightly atrophic. 7. Mildly thickened bladder wall. Correlation with urinalysis recommended to exclude UTI. These results were called by telephone at the time of interpretation on 09/17/2019 at 9:39 pm to patient's nurse Ruthann Cancer, who verbally acknowledged these results. Electronically Signed   By: Anner Crete M.D.   On: 09/17/2019 21:50   MR LUMBAR SPINE WO CONTRAST  Result Date: 09/18/2019 CLINICAL DATA:  Psoas abscess. EXAM: MRI LUMBAR SPINE WITHOUT CONTRAST TECHNIQUE: Multiplanar, multisequence MR imaging of the lumbar spine was performed. No intravenous contrast was administered. COMPARISON:  Correlation made with CT of the abdomen obtained September 17, 2019 FINDINGS: Segmentation:  Standard. Alignment:  Physiologic. Vertebrae: No fracture, evidence of discitis, or bone lesion. Prominent degenerative changes are seen in the L4-5 endplates. Conus medullaris and cauda equina: Conus extends to the L1 level. Conus and cauda equina appear normal. Paraspinal and other soft tissues: A large left psoas hematoma is noted extending from the level of the 3 no L5. The hematoma measures approximately 10.4 by 5.28 x 5.8 cm (cc, AP, AP), unchanged from prior CT. There is mild edema in the adjacent left iliac muscle. Small cysts are seen in the bilateral native kidneys. 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 Disc levels: L1-2, L2-3: No spinal canal or neural foraminal stenosis. L3-4: Shallow disc bulge with superimposed right foraminal disc protrusion and facet degenerative change resulting in moderate narrowing of the right neural foramen. There is no significant spinal canal stenosis. L4-5: Disc bulge with  superimposed right central disc protrusion no associated facet degenerative changes and ligamentum flavum hypertrophy resulting in mild spinal canal stenosis, mild right and mild to moderate left neural foraminal narrowing. L5-S1: Mild facet degenerative changes. No spinal canal or neural foraminal stenosis. IMPRESSION: 1. Large left psoas hematoma, similar in size compared to prior CT. 2. No evidence of discitis/osteomyelitis. 3. 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 Electronically Signed   By: Pedro Earls M.D.   On: 09/18/2019 15:30   IR Removal Tun Cv Cath W/O FL  Result Date: 09/03/2019 INDICATION: Patient with history of ESRD on HD s/p placement of tunneled right IJ hemodialysis catheter at [facility] approximately 1 year ago per wife. Patient now with functioning AVF. Request is made for removal of tunneled hemodialysis catheter EXAM: REMOVAL OF TUNNELED HEMODIALYSIS CATHETER MEDICATIONS: 8 mL of 1% lidocaine COMPLICATIONS: None immediate. PROCEDURE: Informed written consent was obtained from the patient's wife, Marquale Pavlock, following an explanation of the procedure, risks, benefits and alternatives to treatment. A time out was performed prior to the initiation of the procedure. Maximal barrier sterile technique was utilized including caps, mask, sterile gowns, sterile gloves, large sterile drape, hand hygiene, and Hibiclens. 1% lidocaine was injected under  sterile conditions along the subcutaneous tunnel. Utilizing a combination of blunt dissection and gentle traction, the catheter was removed intact. Hemostasis was obtained with manual compression. A dressing was placed. The patient tolerated the procedure well without immediate post procedural complication. IMPRESSION: Successful removal of tunneled dialysis catheter. Read by: Earley Abide,  PA-C Electronically Signed   By: Jacqulynn Cadet M.D.   On: 09/03/2019 15:34   DG CHEST PORT 1 VIEW  Result Date: 09/15/2019 CLINICAL DATA:  69 year old male admitted for respiratory distress. Positive COVID-19 in December. Trouble swallowing, dialysis patient. EXAM: PORTABLE CHEST 1 VIEW COMPARISON:  Chest radiographs 09/10/2019 and earlier. FINDINGS: Portable AP upright view at 0810 hours. Stable lung volumes with moderate elevation of the left hemidiaphragm. Coarse bilateral pulmonary interstitial opacity with peripheral and slight basilar confluence is stable. Normal cardiac size and mediastinal contours. Visualized tracheal air column is within normal limits. No pneumothorax or pleural effusion. Negative visible bowel gas pattern. No acute osseous abnormality identified. IMPRESSION: 1. Stable interstitial and patchy bilateral pulmonary opacity compatible with sequelae of COVID-19 pneumonia. 2. 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 Electronically Signed   By: Genevie Ann M.D.   On: 09/15/2019 08:33   ECHO TEE  Result Date: 09/18/2019    TRANSESOPHOGEAL ECHO REPORT   Patient Name:   Fitzwilliam Teeples Sr. Date of Exam: 09/18/2019 Medical Rec #:  [id number]        Height:       71.0 in Accession #:    [id number]       Weight:       144.6 lb Date of Birth:  1951/04/01        BSA:          1.84 m Patient Age:    34 years         BP:           121/65 mmHg Patient Gender: M                HR:           104 bpm. Exam Location:  Inpatient Procedure: Transesophageal Echo, Cardiac Doppler, Color Doppler and Saline            Contrast Bubble Study Indications:    bacteremia  History:        Patient has prior history of Echocardiogram examinations, most                 recent 08/01/2019. Stroke; Risk Factors:Hypertension, Diabetes                 and Former Smoker.  Sonographer:    Jannett Celestine RDCS (AE) Referring Phys: [id number] [employer]: After  discussion of the risks and benefits of a TEE, an informed consent was obtained from the patient. The transesophogeal probe was passed without difficulty through the esophogus of the patient. Sedation performed by different physician. The patient was monitored while under deep sedation. Anesthestetic sedation was provided intravenously by Anesthesiology: 150mg  of Propofol. Image quality was excellent. The patient's vital signs; including heart rate, blood pressure, and oxygen saturation; remained stable throughout the procedure. The patient developed no complications during the procedure. IMPRESSIONS  1. Left ventricular ejection fraction, by estimation, is 30 to 35%. The left ventricle has severely decreased function. The left ventrical demonstrates regional wall motion abnormalities (see scoring diagram/findings for description). There is moderately increased left ventricular hypertrophy. Left ventricular diastolic function could not be evaluated.  There is severe akinesis of the left ventricular, entire apical segment.  2. Right ventricular systolic function is normal. The right ventricular size is normal.  3. No left atrial/left atrial appendage thrombus was detected.  4. The mitral valve is normal in structure and function. Mild mitral valve regurgitation. No evidence of mitral stenosis.  5. The aortic valve is tricuspid. Aortic valve regurgitation is trivial . No aortic stenosis is present.  6. There is mild (Grade II) plaque involving the descending aorta.  7. Agitated saline contrast bubble study was negative, with no evidence of any interatrial shunt. Conclusion(s)/Recomendation(s): Normal biventricular function without evidence of hemodynamically significant valvular heart disease. No evidence of vegetation/infective endocarditis on this transesophageal echocardiogram. No LA/LAA thrombus identified. Negative bubble study for interatrial shunt. No intracardiac source of embolism detected on this on this  transesophageal echocardiogram. FINDINGS  Left Ventricle: Left ventricular ejection fraction, by estimation, is 30 to 35%. The left ventricle has severely decreased function. The left ventricle demonstrates regional wall motion abnormalities. Severe akinesis of the left ventricular, entire apical segment. There is moderately increased. There is moderately increased left ventricular hypertrophy. Left ventricular diastolic function could not be evaluated. Right Ventricle: The right ventricular size is normal. No increase in right ventricular wall thickness. Right ventricular systolic function is normal. Left Atrium: Left atrial size was not assessed. No left atrial/left atrial appendage thrombus was detected. Right Atrium: Right atrial size was not assessed. Pericardium: There is no evidence of pericardial effusion. Mitral Valve: The mitral valve is normal in structure and function. Mild mitral valve regurgitation. No evidence of mitral valve stenosis. Tricuspid Valve: The tricuspid valve is normal in structure. Tricuspid valve regurgitation is trivial. There is no evidence of tricuspid valve vegetation. Aortic Valve: The aortic valve is tricuspid. Aortic valve regurgitation is trivial. No aortic stenosis is present. There is no evidence of aortic valve vegetation. Pulmonic Valve: The pulmonic valve was normal in structure. Pulmonic valve regurgitation is trivial. There is no evidence of pulmonic valve vegetation. Aorta: The aortic root and ascending aorta are structurally normal, with no evidence of dilitation. There is mild (Grade II) plaque involving the descending aorta. IAS/Shunts: No atrial level shunt detected by color flow Doppler. Agitated saline contrast was given intravenously to evaluate for intracardiac shunting. Agitated saline contrast bubble study was negative, with no evidence of any interatrial shunt. There  is no evidence of a patent foramen ovale. There is no evidence of an atrial septal defect.  Buford Dresser MD Electronically signed by Buford Dresser MD Signature Date/Time: 09/18/2019/3:25:03 PM    Final    ECHOCARDIOGRAM 08/01/2019 IMPRESSIONS    1. Left ventricular ejection fraction, by visual estimation, is 30 to  35%. The left ventricle has moderate to severely decreased function. There  is no left ventricular hypertrophy.  2. Severe dyskinesis of the left ventricular, entire apical segment.  3. Apical left ventricular aneurysm.  4. Definity contrast agent was given IV to delineate the left ventricular  endocardial borders.  5. No intracardiac thrombi or masses were visualized.  6. Left ventricular diastolic parameters are consistent with Grade I  diastolic dysfunction (impaired relaxation).  7. Global right ventricle has normal systolic function.The right  ventricular size is normal. No increase in right ventricular wall  thickness.  8. Left atrial size was normal.  9. Right atrial size was normal.  10. The mitral valve is normal in structure. Mild mitral valve  regurgitation. No evidence of mitral stenosis.  11. The tricuspid valve is  normal in structure.  12. The aortic valve is normal in structure. Aortic valve regurgitation is  trivial. No evidence of aortic valve sclerosis or stenosis.  13. The pulmonic valve was normal in structure. Pulmonic valve  regurgitation is not visualized.  14. The inferior vena cava is normal in size with greater than 50%  respiratory variability, suggesting right atrial pressure of 3 mmHg.   FINDINGS  Left Ventricle: Left ventricular ejection fraction, by visual estimation,  is 30 to 35%. The left ventricle has moderate to severely decreased  function. Severe dyskinesis of the left ventricular, entire apical  segment. Definity contrast agent was given  IV to delineate the left ventricular endocardial borders. The left  ventricle demonstrates regional wall motion abnormalities. There is no  left ventricular  hypertrophy. Left ventricular diastolic parameters are  consistent with Grade I diastolic  dysfunction (impaired relaxation). Normal left atrial pressure. Apical  aneurysm can be seen.   Right Ventricle: The right ventricular size is normal. No increase in  right ventricular wall thickness. Global RV systolic function is has  normal systolic function.   Left Atrium: Left atrial size was normal in size.   Right Atrium: Right atrial size was normal in size   Pericardium: There is no evidence of pericardial effusion.   Mitral Valve: The mitral valve is normal in structure. Mild mitral valve  regurgitation. No evidence of mitral valve stenosis by observation.   Tricuspid Valve: The tricuspid valve is normal in structure. Tricuspid  valve regurgitation is not demonstrated.   Aortic Valve: The aortic valve is normal in structure. Aortic valve  regurgitation is trivial. The aortic valve is structurally normal, with no  evidence of sclerosis or stenosis.   Pulmonic Valve: The pulmonic valve was normal in structure. Pulmonic valve  regurgitation is not visualized. Pulmonic regurgitation is not visualized.   Aorta: The aortic root, ascending aorta and aortic arch are all  structurally normal, with no evidence of dilitation or obstruction.   Venous: The inferior vena cava is normal in size with greater than 50%  respiratory variability, suggesting right atrial pressure of 3 mmHg.   IAS/Shunts: No atrial level shunt detected by color flow Doppler. There is  no evidence of a patent foramen ovale. No ventricular septal defect is  seen or detected. There is no evidence of an atrial septal defect.   Additional Comments: No intracardiac thrombi or masses were visualized.      LV Volumes (MOD)  LV area d, A2C:  44.00 cm Diastology  LV area d, A4C:  43.50 cm LV e' lateral:  5.43 cm/s  LV area s, A2C:  34.10 cm LV E/e' lateral: 10.6  LV area s, A4C:  34.50 cm LV e' medial:   4.05 cm/s  LV major d, A2C:  9.28 cm  LV E/e' medial: 14.2  LV major d, A4C:  9.21 cm  LV major s, A2C:  8.39 cm  LV major s, A4C:  8.13 cm  LV vol d, MOD A2C: 173.0 ml  LV vol d, MOD A4C: 169.0 ml  LV vol s, MOD A2C: 113.0 ml  LV vol s, MOD A4C: 117.0 ml  LV SV MOD A2C:   60.0 ml  LV SV MOD A4C:   169.0 ml  LV SV MOD BP:   54.4 ml   RIGHT VENTRICLE  RV S prime:   11.40 cm/s  TAPSE (M-mode): 2.0 cm   LEFT ATRIUM       Index  RIGHT ATRIUM      Index  LA Vol (A2C):  66.6 ml 33.83 ml/m RA Area:   14.30 cm  LA Vol (A4C):  55.4 ml 28.14 ml/m RA Volume:  34.50 ml 17.52 ml/m  LA Biplane Vol: 62.2 ml 31.59 ml/m  AORTIC VALVE  LVOT Vmax:  91.40 cm/s  LVOT Vmean: 58.400 cm/s  LVOT VTI:  0.221 m   MITRAL VALVE  MV Area (PHT): 3.21 cm       SHUNTS  MV PHT:    68.44 msec      Systemic VTI: 0.22 m  MV Decel Time: 236 msec  MV E velocity: 57.40 cm/s 103 cm/s  MV A velocity: 82.30 cm/s 70.3 cm/s  MV E/A ratio: 0.70    1.5    TEE LEFT VENTRICLE: EF =30-35%. At least moderate LVH. Apical hypokinesis with aneurysm  RIGHT VENTRICLE: Normal size and function.   LEFT ATRIUM: No thrombus/mass.  LEFT ATRIAL APPENDAGE: No thrombus/mass.   RIGHT ATRIUM: No thrombus/mass.  AORTIC VALVE: Trileaflet. Trivialregurgitation. No vegetation.  MITRAL VALVE: Normal structure. Mildregurgitation. No vegetation.  TRICUSPID VALVE: Normal structure.Trivialregurgitation. No vegetation.  PULMONIC VALVE: Grossly normal structure.Trivialregurgitation. No apparent vegetation.  INTERATRIAL SEPTUM: No PFO or ASD seen by color Doppler.Negative bubble study for right to left shunt.  PERICARDIUM: No effusion noted.  DESCENDING AORTA:Milddiffuse plaque seen   CONCLUSION:no cardiac source of embolism    Subjective: Seen and examined while he was in dialysis and he had no complaints and states that he had no  pain.  Was doing well slept okay.  No nausea or vomiting.  Denies any other concerns or points at this time is more pleasant and reviewed today than it was yesterday.  Stable to be discharged to a skilled nursing facility today.  Nephrology is writing for new dialysis orders for [facility].  Discharge Exam: Vitals:   09/22/19 0534 09/22/19 0835  BP: 133/77 112/66  Pulse: (!) 107 96  Resp: 18 16  Temp: 98.8 F (37.1 C) 97.8 F (36.6 C)  SpO2: 96% 97%   Vitals:   09/21/19 2029 09/22/19 0500 09/22/19 0534 09/22/19 0835  BP: 132/78  133/77 112/66  Pulse: 98  (!) 107 96  Resp: 17  18 16   Temp: 98.2 F (36.8 C)  98.8 F (37.1 C) 97.8 F (36.6 C)  TempSrc:   Oral Oral  SpO2: 99%  96% 97%  Weight:  68.3 kg    Height:       General: Pt is alert, awake, not in acute distress Cardiovascular: RRR, S1/S2 +, no rubs, no gallops Respiratory: Mildly diminished bilaterally, no wheezing, no rhonchi; unlabored breathing  Abdominal: Soft, NT, ND, bowel sounds + Extremities: no appreciable edema, no cyanosis  The results of significant diagnostics from this hospitalization (including imaging, microbiology, ancillary and laboratory) are listed below for reference.    Microbiology: Recent Results (from the past 240 hour(s))  Culture, blood (single)     Status: None   Collection Time: 09/15/19 10:16 AM   Specimen: BLOOD LEFT HAND  Result Value Ref Range Status   Specimen Description BLOOD LEFT HAND  Final   Special Requests   Final    AEROBIC BOTTLE ONLY Blood Culture results may not be optimal due to an inadequate volume of blood received in culture bottles   Culture   Final    NO GROWTH 5 DAYS Performed at [facility], [place] [address] [place], [place] [address]    Report Status 09/20/2019  FINAL  Final  SARS CORONAVIRUS 2 (TAT 6-24 HRS) Nasopharyngeal Nasopharyngeal Swab     Status: None   Collection Time: 09/16/19  1:55 PM   Specimen: Nasopharyngeal Swab  Result Value Ref Range Status    SARS Coronavirus 2 NEGATIVE NEGATIVE Final    Comment: (NOTE) SARS-CoV-2 target nucleic acids are NOT DETECTED. The SARS-CoV-2 RNA is generally detectable in upper and lower respiratory specimens during the acute phase of infection. Negative results do not preclude SARS-CoV-2 infection, do not rule out co-infections with other pathogens, and should not be used as the sole basis for treatment or other patient management decisions. Negative results must be combined with clinical observations, patient history, and epidemiological information. The expected result is Negative. Fact Sheet for Patients: SugarRoll.be Fact Sheet for Healthcare Providers: https://www.woods-mathews.com/ This test is not yet approved or cleared by the [place] FDA and  has been authorized for detection and/or diagnosis of SARS-CoV-2 by FDA under an Emergency Use Authorization (EUA). This EUA will remain  in effect (meaning this test can be used) for the duration of the COVID-19 declaration under Section 56 4(b)(1) of the Act, 21 U.S.C. section 360bbb-3(b)(1), unless the authorization is terminated or revoked sooner. Performed at [facility], [place] [address], [place], [place] [address]   SARS CORONAVIRUS 2 (TAT 6-24 HRS)     Status: None   Collection Time: 09/19/19  4:00 AM  Result Value Ref Range Status   SARS Coronavirus 2 NEGATIVE NEGATIVE Final    Comment: (NOTE) SARS-CoV-2 target nucleic acids are NOT DETECTED. The SARS-CoV-2 RNA is generally detectable in upper and lower respiratory specimens during the acute phase of infection. Negative results do not preclude SARS-CoV-2 infection, do not rule out co-infections with other pathogens, and should not be used as the sole basis for treatment or other patient management decisions. Negative results must be combined with clinical observations, patient history, and epidemiological information. The  expected result is Negative. Fact Sheet for Patients: SugarRoll.be Fact Sheet for Healthcare Providers: https://www.woods-mathews.com/ This test is not yet approved or cleared by the [place] FDA and  has been authorized for detection and/or diagnosis of SARS-CoV-2 by FDA under an Emergency Use Authorization (EUA). This EUA will remain  in effect (meaning this test can be used) for the duration of the COVID-19 declaration under Section 56 4(b)(1) of the Act, 21 U.S.C. section 360bbb-3(b)(1), unless the authorization is terminated or revoked sooner. Performed at [facility], [place] [address] [place], [place] [address]     Labs: BNP (last 3 results) Recent Labs    07/30/19 1843  BNP [id number]*   Basic Metabolic Panel: Recent Labs  Lab 09/17/19 0530 09/18/19 0518 09/18/19 1045 09/19/19 0430 09/20/19 0333 09/21/19 0517  NA 131* 137  --  135 136 135  K 5.0 4.0  --  4.5 3.9 4.8  CL 92* 96*  --  91* 94* 93*  CO2 25 28  --  25 28 24   GLUCOSE 223* 91  --  175* 223* 165*  BUN 55* 32*  --  47* 30* 42*  CREATININE 7.87* 5.97*  --  7.63* 5.43* 7.15*  CALCIUM 8.4* 8.6*  --  8.9 8.8* 9.1  MG  --   --  2.1  --  2.0 2.2  PHOS 3.3 3.7  --  3.7 3.4 4.7*   Liver Function Tests: Recent Labs  Lab 09/18/19 0518 09/18/19 1045 09/19/19 0430 09/20/19 0333 09/21/19 0517  AST  --  22  --  19  --   ALT  --  22  --  21  --   ALKPHOS  --  72  --  76  --   BILITOT  --  0.7  --  0.5  --   PROT  --  6.8  --  6.6  --   ALBUMIN 2.5* 2.4* 2.4* 2.5* 2.7*   No results for input(s): LIPASE, AMYLASE in the last 168 hours. No results for input(s): AMMONIA in the last 168 hours. CBC: Recent Labs  Lab 09/17/19 1239 09/18/19 1045 09/19/19 0658 09/20/19 0333 09/21/19 0954  WBC 6.7 9.2 9.1 9.2 8.9  NEUTROABS  --  5.9  --  6.0 5.9  HGB 9.5* 10.7* 10.1* 10.7* 10.7*  HCT 30.9* 35.2* 33.5* 34.9* 34.7*  MCV 102.7* 102.9* 102.4* 102.9* 101.8*  PLT  276 289 278 268 290   Cardiac Enzymes: No results for input(s): CKTOTAL, CKMB, CKMBINDEX, TROPONINI in the last 168 hours. BNP: Invalid input(s): POCBNP CBG: Recent Labs  Lab 09/21/19 0651 09/21/19 1256 09/21/19 1622 09/21/19 2029 09/22/19 0720  GLUCAP 137* 107* 241* 315* 148*   D-Dimer No results for input(s): DDIMER in the last 72 hours. Hgb A1c No results for input(s): HGBA1C in the last 72 hours. Lipid Profile No results for input(s): CHOL, HDL, LDLCALC, TRIG, CHOLHDL, LDLDIRECT in the last 72 hours. Thyroid function studies No results for input(s): TSH, T4TOTAL, T3FREE, THYROIDAB in the last 72 hours.  Invalid input(s): FREET3 Anemia work up No results for input(s): VITAMINB12, FOLATE, FERRITIN, TIBC, IRON, RETICCTPCT in the last 72 hours. Urinalysis    Component Value Date/Time   COLORURINE AMBER (A) 09/11/2019 0534   APPEARANCEUR TURBID (A) 09/11/2019 0534   LABSPEC 1.012 09/11/2019 0534   PHURINE 8.0 09/11/2019 0534   GLUCOSEU NEGATIVE 09/11/2019 0534   HGBUR LARGE (A) 09/11/2019 0534   BILIRUBINUR NEGATIVE 09/11/2019 Alex 09/11/2019 0534   PROTEINUR 100 (A) 09/11/2019 0534   NITRITE NEGATIVE 09/11/2019 0534   LEUKOCYTESUR LARGE (A) 09/11/2019 0534   Sepsis Labs Invalid input(s): PROCALCITONIN,  WBC,  LACTICIDVEN Microbiology Recent Results (from the past 240 hour(s))  Culture, blood (single)     Status: None   Collection Time: 09/15/19 10:16 AM   Specimen: BLOOD LEFT HAND  Result Value Ref Range Status   Specimen Description BLOOD LEFT HAND  Final   Special Requests   Final    AEROBIC BOTTLE ONLY Blood Culture results may not be optimal due to an inadequate volume of blood received in culture bottles   Culture   Final    NO GROWTH 5 DAYS Performed at [facility], [place] [address], [place], [place] [address]    Report Status 09/20/2019 FINAL  Final  SARS CORONAVIRUS 2 (TAT 6-24 HRS) Nasopharyngeal Nasopharyngeal Swab      Status: None   Collection Time: 09/16/19  1:55 PM   Specimen: Nasopharyngeal Swab  Result Value Ref Range Status   SARS Coronavirus 2 NEGATIVE NEGATIVE Final    Comment: (NOTE) SARS-CoV-2 target nucleic acids are NOT DETECTED. The SARS-CoV-2 RNA is generally detectable in upper and lower respiratory specimens during the acute phase of infection. Negative results do not preclude SARS-CoV-2 infection, do not rule out co-infections with other pathogens, and should not be used as the sole basis for treatment or other patient management decisions. Negative results must be combined with clinical observations, patient history, and epidemiological information. The expected result is Negative. Fact  Sheet for Patients: SugarRoll.be Fact Sheet for Healthcare Providers: https://www.woods-mathews.com/ This test is not yet approved or cleared by the [place] FDA and  has been authorized for detection and/or diagnosis of SARS-CoV-2 by FDA under an Emergency Use Authorization (EUA). This EUA will remain  in effect (meaning this test can be used) for the duration of the COVID-19 declaration under Section 56 4(b)(1) of the Act, 21 U.S.C. section 360bbb-3(b)(1), unless the authorization is terminated or revoked sooner. Performed at [facility], [place] [address] [place], [place] [address]   SARS CORONAVIRUS 2 (TAT 6-24 HRS)     Status: None   Collection Time: 09/19/19  4:00 AM  Result Value Ref Range Status   SARS Coronavirus 2 NEGATIVE NEGATIVE Final    Comment: (NOTE) SARS-CoV-2 target nucleic acids are NOT DETECTED. The SARS-CoV-2 RNA is generally detectable in upper and lower respiratory specimens during the acute phase of infection. Negative results do not preclude SARS-CoV-2 infection, do not rule out co-infections with other pathogens, and should not be used as the sole basis for treatment or other patient management decisions. Negative  results must be combined with clinical observations, patient history, and epidemiological information. The expected result is Negative. Fact Sheet for Patients: SugarRoll.be Fact Sheet for Healthcare Providers: https://www.woods-mathews.com/ This test is not yet approved or cleared by the [place] FDA and  has been authorized for detection and/or diagnosis of SARS-CoV-2 by FDA under an Emergency Use Authorization (EUA). This EUA will remain  in effect (meaning this test can be used) for the duration of the COVID-19 declaration under Section 56 4(b)(1) of the Act, 21 U.S.C. section 360bbb-3(b)(1), unless the authorization is terminated or revoked sooner. Performed at [facility], [place] [address], [place], [place] [address]    Time coordinating discharge: 35 minutes  SIGNED:  Kerney Elbe, DO [facility] 09/22/2019, 8:47 AM Pager is on [place]  If 7PM-7AM, please contact night-coverage [email or web address] Password [email or web address]

## 2019-09-22 NOTE — Plan of Care (Signed)
  Problem: Coping: Goal: Psychosocial and spiritual needs will be supported Outcome: Progressing   

## 2019-09-22 NOTE — Progress Notes (Addendum)
Subjective:   Seen in room. Completed HD yesterday, BP low on HD. No new complaints. Discharge to SNF pending.   Objective Vital signs in last 24 hours: Vitals:   09/21/19 2029 09/22/19 0500 09/22/19 0534 09/22/19 0835  BP: 132/78  133/77 112/66  Pulse: 98  (!) 107 96  Resp: 17  18 16   Temp: 98.2 F (36.8 C)  98.8 F (37.1 C) 97.8 F (36.6 C)  TempSrc:   Oral Oral  SpO2: 99%  96% 97%  Weight:  68.3 kg    Height:       Weight change:   Physical Exam General: Alert, lying in bed, follows commands, nad  Heart:RRR,   Lungs:CTAB Abdomen:soft, non-tender  Extremities:no LE edema Dialysis Access:RU AVF +bruit   Dialysis Orders: TTSG/O -[facility] when COVID 19 negative 4 hr 15 min 180NRE 400/800 80.5 kg 2.0 K/ 2.0 Ca R AVF -Heparin 8000 units IV TIW -Mircera 75 mcg IV q 2 weeks (last dose 07/25/19) -Venofer 50 mg IV weekly -Parsabiv 7.5 mg IV TIW   Problem/Plan  1.Acute COVID-19 pneumonia - s/p remdesivir &Decadron.  Out of isolation window and has had 2 negative test results. [facility] when SNF is arranged.  2. Fevers Recurrent  - TEE no vegetations / Monitor off Antibx / ID has signed off/  Large Left Psoas hematoma on CT, similar in size to prior CT. No sign of infection. No intervention currently  2.ESRD- HD on TTS. TDC removed1/26. For HD today. MWF HD at [facility]. Plan next HD 2/15 to get back on schedule.  Will write orders in case still here.  3. Anemiaof CKD-Hgb10.7.  Aranesp to 150 q Tues. Monitor.  4. Secondary hyperparathyroidism-Ca/P ok. No binders or VDRA. No parsabiv because not on formulary.  5.HTN/volume-Does not appear volume overloaded. Blood pressure well controlled.Well below EDW, will need to lower at d/c 6. Nutrition- Renal diet. Continue renal vit/prostat. 7.LV thrombus-coumadin. 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 Per primary 9.Disposition: SNF placement -[facility] at [facility] PA-C [place] Pager [phone] 09/22/2019,10:28 AM  LOS: 54 days   Labs: Basic Metabolic Panel: Recent Labs  Lab 09/20/19 0333 09/21/19 0517 09/22/19 0733  NA 136 135 136  K 3.9 4.8 4.2  CL 94* 93* 99  CO2 28 24 25   GLUCOSE 223* 165* 151*  BUN 30* 42* 25*  CREATININE 5.43* 7.15* 4.92*  CALCIUM 8.8* 9.1 8.7*  PHOS 3.4 4.7* 2.9   Liver Function Tests: Recent Labs  Lab 09/18/19 1045 09/19/19 0430 09/20/19 0333 09/21/19 0517 09/22/19 0733  AST 22  --  19  --   --   ALT 22  --  21  --   --   ALKPHOS 72  --  76  --   --   BILITOT 0.7  --  0.5  --   --   PROT 6.8  --  6.6  --   --   ALBUMIN 2.4*   < > 2.5* 2.7* 2.5*   < > = values in this interval not displayed.   No results for input(s): LIPASE, AMYLASE in the last 168 hours. No results for input(s): AMMONIA in the last 168 hours. CBC: Recent Labs  Lab 09/17/19 1239 09/17/19 1239 09/18/19 1045 09/18/19 1045 09/19/19 0658 09/20/19 0333 09/21/19 0954  WBC 6.7   < > 9.2   < > 9.1 9.2 8.9  NEUTROABS  --   --  5.9  --   --  6.0 5.9  HGB 9.5*   < > 10.7*   < > 10.1* 10.7* 10.7*  HCT 30.9*   < > 35.2*   < > 33.5* 34.9* 34.7*  MCV 102.7*  --  102.9*  --  102.4* 102.9* 101.8*  PLT 276   < > 289   < > 278 268 290   < > = values in this interval not displayed.   Cardiac Enzymes: No results for input(s): CKTOTAL, CKMB, CKMBINDEX, TROPONINI in the last 168 hours. CBG: Recent Labs  Lab 09/21/19 0651 09/21/19 1256 09/21/19 1622 09/21/19 2029 09/22/19 0720  GLUCAP 137* 107* 241* 315* 148*    Studies/Results: No results found. Medications: . sodium chloride 10 mL/hr at 09/14/19 1008   .  stroke: mapping our early stages of recovery book   Does not apply Once  . allopurinol  100 mg Oral Daily  . aspirin  81 mg Oral Daily  . atorvastatin  40 mg Oral Daily  . Chlorhexidine Gluconate Cloth  6 each Topical Q0600  . darbepoetin (ARANESP) injection - DIALYSIS  150 mcg  Intravenous Q Tue-HD  . feeding supplement (NEPRO CARB STEADY)  237 mL Oral TID BM  . feeding supplement (PRO-STAT SUGAR FREE 64)  30 mL Oral BID  . insulin aspart  0-15 Units Subcutaneous TID WC  . insulin aspart  0-5 Units Subcutaneous QHS  . insulin aspart  3 Units Subcutaneous TID WC  . metoCLOPramide  5 mg Oral BID WC  . metoprolol succinate  50 mg Oral Daily  . multivitamin  1 tablet Oral QHS  . pantoprazole  40 mg Oral Daily  . predniSONE  5 mg Oral Q breakfast  . sodium chloride flush  3 mL Intravenous Q12H  . warfarin  7.5 mg Oral ONCE-1800  . Warfarin - Pharmacist Dosing Inpatient   Does not apply [phone]

## 2019-09-22 NOTE — Progress Notes (Signed)
DISCHARGE NOTE SNF [facility] Stirling Sr. to be discharged Skilled nursing facility per MD order. Patient verbalized understanding.  Skin clean, dry and intact without evidence of skin break down, no evidence of skin tears noted. IV catheter discontinued intact. Site without signs and symptoms of complications. Dressing and pressure applied. Pt denies pain at the site currently. No complaints noted.  Patient free of lines, drains, and wounds.   Discharge packet assembled. An After Visit Summary (AVS) was printed and given to the EMS personnel. Patient escorted via stretcher and discharged to [facility] via ambulance. Report called to accepting facility; all questions and concerns addressed.   Dolores Hoose, RN

## 2019-09-22 NOTE — TOC Transition Note (Addendum)
Transition of Care [facility]) - CM/SW Discharge Note   Patient Details  Name: Jonathan Aldinger Sr. MRN: [id number] Date of Birth: [birth year]  Transition of [facility] CM/SW Contact:  Alberteen Sam, LCSW Phone Number: 09/22/2019, 12:32 PM   Clinical Narrative:     Patient will DC to: [facility] Anticipated DC date: 09/22/2019 Family notified: Oria (son) Transport by: Corey Harold  Per MD patient ready for DC to [facility]. RN, patient, patient's family, and facility notified of DC. Discharge Summary sent to facility. RN given number for report [phone] Room 3209. DC packet on chart. Ambulance transport requested for patient for 3pm per RN request..  CSW signing off.  Lawton, Gervais   Final next level of care: Skilled Nursing Facility Barriers to Discharge: No Barriers Identified   Patient Goals and CMS Choice   CMS Medicare.gov Compare Post Acute Care list provided to:: Patient Represenative (must comment)(son Dilyn) Choice offered to / list presented to : Adult Children  Discharge Placement PASRR number recieved: 08/03/19            Patient chooses bed at: [facility] Patient to be transferred to facility by: [facility] Name of family member notified: Vinal (son) Patient and family notified of of transfer: 09/22/19  Discharge Plan and Services                                     Social Determinants of Health (SDOH) Interventions     Readmission Risk Interventions No flowsheet data found.

## 2019-09-22 NOTE — Plan of Care (Signed)
  Problem: Education: Goal: Knowledge of risk factors and measures for prevention of condition will improve Outcome: Adequate for Discharge   Problem: Coping: Goal: Psychosocial and spiritual needs will be supported 09/22/2019 1332 by Dolores Hoose, RN Outcome: Adequate for Discharge 09/22/2019 0804 by Dolores Hoose, RN Outcome: Progressing   Problem: Education: Goal: Knowledge of disease or condition will improve Outcome: Adequate for Discharge Goal: Knowledge of secondary prevention will improve Outcome: Adequate for Discharge Goal: Knowledge of patient specific risk factors addressed and post discharge goals established will improve Outcome: Adequate for Discharge   Problem: Health Behavior/Discharge Planning: Goal: Ability to manage health-related needs will improve Outcome: Adequate for Discharge

## 2019-09-22 NOTE — Progress Notes (Signed)
ANTICOAGULATION CONSULT NOTE - Follow Up Consult  Pharmacy Consult for Coumadin Indication: LV thrombus, CVA  No Known Allergies  Patient Measurements: Height: 5\' 11"  (180.3 cm) Weight: 150 lb 9.2 oz (68.3 kg) IBW/kg (Calculated) : 75.3  Vital Signs: Temp: 97.8 F (36.6 C) (02/14 0835) Temp Source: Oral (02/14 0835) BP: 112/66 (02/14 0835) Pulse Rate: 96 (02/14 0835)  Labs: Recent Labs    09/20/19 0333 09/21/19 0517 09/21/19 0954 09/22/19 0733  HGB 10.7*  --  10.7*  --   HCT 34.9*  --  34.7*  --   PLT 268  --  290  --   LABPROT 25.2* 26.5*  --  21.0*  INR 2.3* 2.5*  --  1.8*  CREATININE 5.Jonathan* 7.15*  --   --     Estimated Creatinine Clearance: 9.8 mL/min (A) (by C-G formula based on SCr of 7.15 mg/dL (H)).   Assessment: 69 yo [name] previously on apixaban for LV thrombus which was changed to warfarin on this admission. Pt noted to have recent stroke on MRI 12/23.   INR has been labile this admission - warfarin was held x2 doses 2/2-2/3 for high INR, then had low INR of 1.8 on 2/7. Of note, warfarin 4mg  ordered for 2/12 was not given and documented as patient/family refused. INR today of 1.8 is subtherapeutic. CBC stable yesterday. No reported bleeding.   Of note, CT abdomen pelvis from 2/9 shows L psoas IM hematoma and possible hemopericardium, still thought to be hematoma on MRI lumbar spine. Per discussion with Dr. Alfredia Ferguson on 2/12, ok to continue warfarin for now.   Goal of Therapy:  INR 2-3 Monitor platelets by anticoagulation protocol: Yes   Plan:  Warfarin 7.5mg  PO x 1 dose tonight Monitor INR, CBC, and S/S of bleeding daily   Cristela Felt, PharmD PGY1 Pharmacy Resident [employer]: [phone]  09/22/2019 9:01 AM

## 2019-09-24 ENCOUNTER — Ambulatory Visit: Payer: Medicare Other | Admitting: Podiatry

## 2019-09-27 DIAGNOSIS — E46 Unspecified protein-calorie malnutrition: Secondary | ICD-10-CM | POA: Insufficient documentation

## 2019-12-03 DIAGNOSIS — R519 Headache, unspecified: Secondary | ICD-10-CM | POA: Insufficient documentation

## 2020-01-16 DIAGNOSIS — D649 Anemia, unspecified: Secondary | ICD-10-CM | POA: Insufficient documentation

## 2020-01-30 ENCOUNTER — Encounter (HOSPITAL_BASED_OUTPATIENT_CLINIC_OR_DEPARTMENT_OTHER): Payer: Medicare Other | Attending: Internal Medicine | Admitting: Internal Medicine

## 2020-01-30 ENCOUNTER — Other Ambulatory Visit: Payer: Self-pay

## 2020-01-30 DIAGNOSIS — L97411 Non-pressure chronic ulcer of right heel and midfoot limited to breakdown of skin: Secondary | ICD-10-CM | POA: Insufficient documentation

## 2020-01-30 DIAGNOSIS — I132 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 kidney disease, or end stage renal disease: Secondary | ICD-10-CM | POA: Insufficient documentation

## 2020-01-30 DIAGNOSIS — Z992 Dependence on renal dialysis: Secondary | ICD-10-CM | POA: Insufficient documentation

## 2020-01-30 DIAGNOSIS — Z8249 Family history of ischemic heart disease and other diseases of the circulatory system: Secondary | ICD-10-CM | POA: Diagnosis not present

## 2020-01-30 DIAGNOSIS — Z87891 Personal history of nicotine dependence: Secondary | ICD-10-CM | POA: Diagnosis not present

## 2020-01-30 DIAGNOSIS — I739 Peripheral vascular disease, unspecified: Secondary | ICD-10-CM | POA: Diagnosis not present

## 2020-01-30 DIAGNOSIS — E11621 Type 2 diabetes mellitus with foot ulcer: Secondary | ICD-10-CM | POA: Insufficient documentation

## 2020-01-30 DIAGNOSIS — E1122 Type 2 diabetes mellitus with diabetic chronic kidney disease: Secondary | ICD-10-CM | POA: Insufficient documentation

## 2020-01-30 DIAGNOSIS — Z8673 Personal history of transient ischemic attack (TIA), and cerebral infarction without residual deficits: Secondary | ICD-10-CM | POA: Insufficient documentation

## 2020-01-30 DIAGNOSIS — Z833 Family history of diabetes mellitus: Secondary | ICD-10-CM | POA: Diagnosis not present

## 2020-01-30 DIAGNOSIS — F0150 Vascular dementia without behavioral disturbance: Secondary | ICD-10-CM | POA: Insufficient documentation

## 2020-01-30 DIAGNOSIS — I513 Intracardiac thrombosis, not elsewhere classified: Secondary | ICD-10-CM | POA: Diagnosis not present

## 2020-01-30 DIAGNOSIS — L97512 Non-pressure chronic ulcer of other part of right foot with fat layer exposed: Secondary | ICD-10-CM | POA: Diagnosis not present

## 2020-01-30 DIAGNOSIS — Z809 Family history of malignant neoplasm, unspecified: Secondary | ICD-10-CM | POA: Insufficient documentation

## 2020-01-30 DIAGNOSIS — I252 Old myocardial infarction: Secondary | ICD-10-CM | POA: Insufficient documentation

## 2020-01-30 DIAGNOSIS — Z8619 Personal history of other infectious and parasitic diseases: Secondary | ICD-10-CM | POA: Insufficient documentation

## 2020-01-30 DIAGNOSIS — I2510 Atherosclerotic heart disease of native coronary artery without angina pectoris: Secondary | ICD-10-CM | POA: Insufficient documentation

## 2020-01-30 DIAGNOSIS — E1151 Type 2 diabetes mellitus with diabetic peripheral angiopathy without gangrene: Secondary | ICD-10-CM | POA: Insufficient documentation

## 2020-01-30 DIAGNOSIS — Z940 Kidney transplant status: Secondary | ICD-10-CM | POA: Insufficient documentation

## 2020-01-30 DIAGNOSIS — F0390 Unspecified dementia without behavioral disturbance: Secondary | ICD-10-CM | POA: Diagnosis not present

## 2020-01-30 DIAGNOSIS — E1140 Type 2 diabetes mellitus with diabetic neuropathy, unspecified: Secondary | ICD-10-CM | POA: Insufficient documentation

## 2020-01-30 DIAGNOSIS — Z8616 Personal history of COVID-19: Secondary | ICD-10-CM | POA: Diagnosis not present

## 2020-01-30 DIAGNOSIS — I5020 Unspecified systolic (congestive) heart failure: Secondary | ICD-10-CM | POA: Insufficient documentation

## 2020-01-30 DIAGNOSIS — N186 End stage renal disease: Secondary | ICD-10-CM | POA: Diagnosis not present

## 2020-01-30 NOTE — Progress Notes (Signed)
SHAKIM, FAITH ([id number]) Visit Report for 01/30/2020 Abuse/Suicide Risk Screen Details Patient Name: Date of Service: Jonathan [name], Jonathan [name] 01/30/2020 1:15 PM Medical Record Number: [id number] Patient Account Number: [id number] Date of Birth/Sex: Treating RN: 03/01/1951 (69 y.o. Jonathan [name] Primary Care Kebrina Friend: Harvin Hazel M Other Clinician: Referring Kalep Full: Treating Kennedie Pardoe/Extender: Jarold Song, Lyman Bishop M Weeks in Treatment: 0 Abuse/Suicide Risk Screen Items Answer ABUSE RISK SCREEN: Has anyone close to you tried to hurt or harm you recentlyo No Do you feel uncomfortable with anyone in your familyo No Has anyone forced you do things that you didnt want to doo No Electronic Signature(s) Signed: 01/30/2020 5:29:15 PM By: Kela Millin Entered By: Kela Millin on 01/30/2020 13:24:05 -------------------------------------------------------------------------------- Activities of Daily Living Details Patient Name: Date of Service: Jonathan [name], Jonathan [name] 01/30/2020 1:15 PM Medical Record Number: [id number] Patient Account Number: [id number] Date of Birth/Sex: Treating RN: Mar 03, 1951 (68 y.o. Jonathan [name] Primary Care Tatanisha Cuthbert: Harvin Hazel M Other Clinician: Referring Jaskaran Dauzat: Treating Jaylyn Booher/Extender: Jarold Song, Lyman Bishop M Weeks in Treatment: 0 Activities of Daily Living Items Answer Activities of Daily Living (Please select one for each item) Drive Automobile Not Able T Medications ake Need Assistance Use T elephone Need Assistance Care for Appearance Need Assistance Use T oilet Need Assistance Bath / Shower Need Assistance Dress Self Need Assistance Feed Self Need Assistance Walk Need Assistance Get In / Out Bed Need Assistance Housework Not Able Prepare Meals Need Assistance Handle Money Need Assistance Shop for Self Need Assistance Electronic Signature(s) Signed: 01/30/2020 5:29:15 PM By: Kela Millin Entered By: Kela Millin on 01/30/2020 13:24:38 -------------------------------------------------------------------------------- Education Screening Details Patient Name: Date of Service: Jonathan [name], Jonathan [name] 01/30/2020 1:15 PM Medical Record Number: [id number] Patient Account Number: [id number] Date of Birth/Sex: Treating RN: 25-Oct-1950 (69 y.o. Jonathan [name] Primary Care Ilee Randleman: Harvin Hazel M Other Clinician: Referring Annette Liotta: Treating Dolce Sylvia/Extender: Jarold Song, Elder Negus Weeks in Treatment: 0 Primary Learner Assessed: Patient Learning Preferences/Education Level/Primary Language Learning Preference: Explanation Highest Education Level: High School Preferred Language: English Cognitive Barrier Language Barrier: No Translator Needed: No Memory Deficit: Yes vascular dementia Emotional Barrier: No Cultural/Religious Beliefs Affecting Medical Care: No Physical Barrier Impaired Vision: No Impaired Hearing: No Decreased Hand dexterity: No Knowledge/Comprehension Knowledge Level: Medium Comprehension Level: Medium Ability to understand written instructions: Medium Ability to understand verbal instructions: Medium Motivation Anxiety Level: Calm Cooperation: Cooperative Education Importance: Acknowledges Need Interest in Health Problems: Asks Questions Perception: Coherent Willingness to Engage in Self-Management High Activities: Readiness to Engage in Self-Management High Activities: Electronic Signature(s) Signed: 01/30/2020 5:29:15 PM By: Kela Millin Entered By: Kela Millin on 01/30/2020 13:25:15 -------------------------------------------------------------------------------- Fall Risk Assessment Details Patient Name: Date of Service: Jonathan [name], Jonathan [name] 01/30/2020 1:15 PM Medical Record Number: [id number] Patient Account Number: [id number] Date of Birth/Sex: Treating RN: 12-23-1950 (68 y.o. Jonathan [name] Primary Care Kingdavid Leinbach: Harvin Hazel M Other  Clinician: Referring Hines Kloss: Treating Ailana Cuadrado/Extender: Jarold Song, Lyman Bishop M Weeks in Treatment: 0 Fall Risk Assessment Items Have you had 2 or more falls in the last 12 monthso 0 No Have you had any fall that resulted in injury in the last 12 monthso 0 No FALLS RISK SCREEN History of falling - immediate or within 3 months 0 No Secondary diagnosis (Do you have 2 or more medical diagnoseso) 0 No Ambulatory aid None/bed rest/wheelchair/nurse 0 Yes Crutches/cane/walker 0 No Furniture 0 No Intravenous therapy Access/Saline/Heparin Lock 0 No Gait/Transferring Normal/ bed rest/ wheelchair 0 Yes  Weak (short steps with or without shuffle, stooped but able to lift head while walking, may seek 0 No support from furniture) Impaired (short steps with shuffle, may have difficulty arising from chair, head down, impaired 0 No balance) Mental Status Oriented to own ability 0 Yes Electronic Signature(s) Signed: 01/30/2020 5:29:15 PM By: Kela Millin Entered By: Kela Millin on 01/30/2020 13:26:50 -------------------------------------------------------------------------------- Foot Assessment Details Patient Name: Date of Service: Jonathan [name], Jonathan [name] 01/30/2020 1:15 PM Medical Record Number: [id number] Patient Account Number: [id number] Date of Birth/Sex: Treating RN: 31-Dec-1950 (69 y.o. Jonathan [name] Primary Care Krystol Rocco: Harvin Hazel M Other Clinician: Referring Zula Hovsepian: Treating Asjah Rauda/Extender: Jarold Song, Lyman Bishop M Weeks in Treatment: 0 Foot Assessment Items Site Locations + = Sensation present, - = Sensation absent, C = Callus, U = Ulcer R = Redness, W = Warmth, M = Maceration, PU = Pre-ulcerative lesion F = Fissure, S = Swelling, D = Dryness Assessment Right: Left: Other Deformity: No No Prior Foot Ulcer: No No Prior Amputation: No No Charcot Joint: No No Ambulatory Status: Ambulatory With Help Assistance Device: Wheelchair Gait:  Buyer, retail Signature(s) Signed: 01/30/2020 5:29:15 PM By: Kela Millin Entered By: Kela Millin on 01/30/2020 13:42:03 -------------------------------------------------------------------------------- Nutrition Risk Screening Details Patient Name: Date of Service: Jonathan [name], Jonathan [name] 01/30/2020 1:15 PM Medical Record Number: [id number] Patient Account Number: [id number] Date of Birth/Sex: Treating RN: 11-01-1950 (69 y.o. Jonathan [name] Primary Care Lorae Roig: Harvin Hazel M Other Clinician: Referring Camellia Popescu: Treating Keeghan Mcintire/Extender: Jarold Song, Lyman Bishop M Weeks in Treatment: 0 Height (in): 71 Weight (lbs): 145 Body Mass Index (BMI): 20.2 Nutrition Risk Screening Items Score Screening NUTRITION RISK SCREEN: I have an illness or condition that made me change the kind and/or amount of food I eat 2 Yes I eat fewer than two meals per day 0 No I eat few fruits and vegetables, or milk products 0 No I have three or more drinks of beer, liquor or wine almost every day 0 No I have tooth or mouth problems that make it hard for me to eat 0 No I don't always have enough money to buy the food I need 0 No I eat alone most of the time 0 No I take three or more different prescribed or over-the-counter drugs a day 1 Yes Without wanting to, I have lost or gained 10 pounds in the last six months 2 Yes I am not always physically able to shop, cook and/or feed myself 0 No Nutrition Protocols Good Risk Protocol Moderate Risk Protocol 0 Provide education on nutrition High Risk Proctocol Risk Level: Moderate Risk Score: 5 Electronic Signature(s) Signed: 01/30/2020 5:29:15 PM By: Kela Millin Entered By: Kela Millin on 01/30/2020 13:27:10

## 2020-01-31 NOTE — Progress Notes (Signed)
PAPA, PIERCEFIELD ([id number]) Visit Report for 01/30/2020 Chief Complaint Document Details Patient Name: Date of Service: Jonathan [name], Jonathan [name] 01/30/2020 1:15 PM Medical Record Number: [id number] Patient Account Number: [id number] Date of Birth/Sex: Treating RN: [birth year] (69 y.o. Hessie Diener Primary Care Provider: Harvin Hazel M Other Clinician: Referring Provider: Treating Provider/Extender: Jarold Song, Lyman Bishop M Weeks in Treatment: 0 Information Obtained from: Patient Chief Complaint 01/30/2020; patient is here for review of wounds on his right lateral heel and foot Electronic Signature(s) Signed: 01/31/2020 5:27:17 PM By: Linton Ham MD Entered By: Linton Ham on 01/30/2020 14:36:11 -------------------------------------------------------------------------------- Debridement Details Patient Name: Date of Service: Jonathan [name], Jonathan [name] 01/30/2020 1:15 PM Medical Record Number: [id number] Patient Account Number: [id number] Date of Birth/Sex: Treating RN: 10-30-1950 (69 y.o. [name], Meta.Reding Primary Care Provider: Harvin Hazel M Other Clinician: Referring Provider: Treating Provider/Extender: Jarold Song, Lyman Bishop M Weeks in Treatment: 0 Debridement Performed for Assessment: Wound #3 Right,Lateral Calcaneus Performed By: Physician Ricard Dillon., MD Debridement Type: Debridement Severity of Tissue Pre Debridement: Fat layer exposed Level of Consciousness (Pre-procedure): Awake and Alert Pre-procedure Verification/Time Out Yes - 14:10 Taken: Start Time: 14:11 Pain Control: Lidocaine 4% T opical Solution T Area Debrided (L x W): otal 1.5 (cm) x 2.3 (cm) = 3.45 (cm) Tissue and other material debrided: Non-Viable, Callus, Skin: Dermis Level: Skin/Dermis Debridement Description: Selective/Open Wound Instrument: Curette Bleeding: None End Time: 14:16 Procedural Pain: 0 Post Procedural Pain: 0 Response to Treatment: Procedure was tolerated well Level of  Consciousness (Post- Awake and Alert procedure): Post Debridement Measurements of Total Wound Length: (cm) 0.3 Width: (cm) 0.3 Depth: (cm) 0.1 Volume: (cm) 0.007 Character of Wound/Ulcer Post Debridement: Requires Further Debridement Severity of Tissue Post Debridement: Fat layer exposed Post Procedure Diagnosis Same as Pre-procedure Electronic Signature(s) Signed: 01/30/2020 5:34:37 PM By: Deon Pilling Signed: 01/31/2020 5:27:17 PM By: Linton Ham MD Entered By: Linton Ham on 01/30/2020 14:35:47 -------------------------------------------------------------------------------- HPI Details Patient Name: Date of Service: Jonathan [name], Jonathan [name] 01/30/2020 1:15 PM Medical Record Number: [id number] Patient Account Number: [id number] Date of Birth/Sex: Treating RN: 1950-09-14 (69 y.o. Hessie Diener Primary Care Provider: Harvin Hazel M Other Clinician: Referring Provider: Treating Provider/Extender: Jarold Song, Lyman Bishop M Weeks in Treatment: 0 History of Present Illness HPI Description: ADMISSION 01/30/2020 This is a 69 year old man who was hospitalized with Covid for a month in January 2021. Subsequently he was discharged to [facility] skilled facility for rehabilitation. His wife states that when he got home he had areas on the right lateral heel, right lateral fifth metatarsal head. They have been using Santyl to the right lateral heel no dressing on the right lateral foot. Our intake nurse discovered in small area on the right lateral great toe. He is minimally ambulatory at this point. He does not have a known history of PAD although he is a diabetic. 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 Hessie Diener Primary Care Provider: Harvin Hazel M Other Clinician: Referring Provider: Treating Provider/Extender: Jarold Song, Lyman Bishop M Weeks in Treatment: 0 Constitutional Sitting or standing Blood Pressure is within target range for patient.. Pulse regular and within target range for patient.Marland Kitchen Respirations regular, non-labored and within target range.. Temperature is normal and within the target range for the patient.Marland Kitchen Appears in no distress. Respiratory work of breathing is normal. Cardiovascular Heart rhythm and rate regular, without murmur or gallop.. Popliteal and femoral pulses are palpable.. Neither dorsalis pedis pulse nor the posterior tibial pulses palpable on the right. Psychiatric appears at normal baseline. Notes Wound exam; the areas in question include the lateral aspect of the right heel. This had a thick eschar over what was one sick a fair sized wound. I used a #5 curette to remove this. There are only a tiny open area remaining in this area He had an eschar over the lateral right fifth metatarsal head that was removed when our nurse was washing on intake. This is a small punched out wound. No debridement in this area He has a minuscule open area at the lateral part of the right great toe superiorly. Electronic Signature(s) Signed: 01/31/2020 5:27:17 PM By: Linton Ham MD Entered By: Linton Ham on 01/30/2020  14:40:12 -------------------------------------------------------------------------------- Physician Orders Details Patient Name: Date of Service: Jonathan [name], Jonathan [name] 01/30/2020 1:15 PM Medical Record Number: [id number] Patient Account Number: [id number] Date of Birth/Sex: Treating RN: 30-May-1951 (69 y.o. Hessie Diener Primary Care Provider: Harvin Hazel M Other Clinician: Referring Provider: Treating Provider/Extender: Jarold Song, Lyman Bishop M Weeks in Treatment: 0 Verbal / Phone Orders: No Diagnosis Coding ICD-10 Coding Code Description E11.621 Type 2 diabetes mellitus with foot ulcer I73.9 Peripheral vascular disease, unspecified Follow-up Appointments Return Appointment in 1 week. Dressing Change Frequency Change Dressing every other day. Wound Cleansing May shower with protection. - use a cast protector on all other days not changing the dressings. May shower and wash wound with soap and water. - with dressing changes. Primary Wound Dressing Wound #1 Right,Lateral Foot Calcium Alginate with Silver Wound #2 Right,Medial T Great oe Calcium Alginate with Silver Wound #3 Right,Lateral Calcaneus Calcium Alginate with Silver Secondary Dressing Wound #2 Right,Medial T Great oe Kerlix/Rolled Gauze Dry Gauze Wound #3 Right,Lateral Calcaneus Kerlix/Rolled Gauze Dry Gauze Heel Cup Wound #1 Right,Lateral Foot Foam Border - or gauze and kerlix. Off-Loading Open toe surgical shoe to: - right foot Other: - float right heel with pillows to offload pressure to heel while resting in chair and bed. Home Health dmit to [place] for [facility] home health change twice a week. wound center weekly. A Patient is currently receiving PT and OT with [facility] home health. Services and Therapies rterial Studies- Bilateral - Vein and Vascular Arterial studies bilateral with ABIs and TBIs related to nonpalpable pedal pulses. - (ICD10 I73.9 - A Peripheral vascular  disease, unspecified) Electronic Signature(s) Signed: 01/30/2020 5:34:37 PM By: Deon Pilling Signed: 01/31/2020 5:27:17 PM By: Linton Ham MD Entered By: Deon Pilling on 01/30/2020 14:19:58 Prescription 01/30/2020 -------------------------------------------------------------------------------- Antonietta Barcelona MD Patient Name: Provider: [birth year] [id number] Date of Birth: NPI#: [name] [id number] Sex: DEA #: [phone] Phone #: License #: [place] Patient Address: [address] [place], [place] [address] [place], [place] [address] [phone] Allergies Name Reaction Severity No Known Allergies Provider's Orders rterial Studies- Bilateral - ICD10:  I73.9 - Vein and Vascular Arterial studies bilateral with ABIs and TBIs related to nonpalpable pedal pulses. A Hand Signature: Date(s): Electronic Signature(s) Signed: 01/30/2020 5:34:37 PM By: Deon Pilling Signed: 01/31/2020 5:27:17 PM By: Linton Ham MD Entered By: Deon Pilling on 01/30/2020 14:19:59 -------------------------------------------------------------------------------- Problem List Details Patient Name: Date of Service: Jonathan [name], Jonathan [name] 01/30/2020 1:15 PM Medical Record Number: [id number] Patient Account Number: [id number] Date of Birth/Sex: Treating RN: Oct 25, 1950 (69 y.o. [name], Tammi Klippel Primary Care Provider: Harvin Hazel M Other Clinician: Referring Provider: Treating Provider/Extender: Jarold Song, Lyman Bishop M Weeks in Treatment: 0 Active Problems ICD-10 Encounter Code Description Active Date MDM Diagnosis E11.621 Type 2 diabetes mellitus with foot ulcer 01/30/2020 No Yes L97.512 Non-pressure chronic ulcer of other part of right foot with fat layer exposed 01/30/2020 No Yes L97.411 Non-pressure chronic ulcer of right heel and midfoot limited to breakdown of 01/30/2020 No Yes skin E11.51 Type 2 diabetes  mellitus with diabetic peripheral angiopathy without gangrene 01/30/2020 No Yes Inactive Problems Resolved Problems Electronic Signature(s) Signed: 01/31/2020 5:27:17 PM By: Linton Ham MD Entered By: Linton Ham on 01/30/2020 14:24:53 -------------------------------------------------------------------------------- Progress Note Details Patient Name: Date of Service: Jonathan [name], Jonathan [name] 01/30/2020 1:15 PM Medical Record Number: [id number] Patient Account Number: [id number] Date of Birth/Sex: Treating RN: [birth year] (69 y.o. Hessie Diener Primary Care Provider: Harvin Hazel M Other Clinician: Referring Provider: Treating Provider/Extender: Jarold Song, Lyman Bishop M Weeks in Treatment: 0 Subjective Chief Complaint Information obtained from Patient 01/30/2020; patient is here for review of wounds on his right lateral heel and foot History of Present Illness (HPI) ADMISSION 01/30/2020 This is a 69 year old man who was hospitalized with Covid for a month in January 2021. Subsequently he was discharged to [facility] skilled facility for rehabilitation. His wife states that when he got home he had areas on the right lateral heel, right lateral fifth metatarsal head. They have been using Santyl to the right lateral heel no dressing on the right lateral foot. Our intake nurse discovered in small area on the right lateral great toe. He is minimally ambulatory at this point. He does not have a known history of PAD although he is a diabetic. 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 Allergies No Known Allergies Family History Cancer - Mother,Siblings, Diabetes - Maternal Grandparents,Paternal Grandparents,Father,Siblings, Heart Disease - Mother, Kidney Disease - Siblings, No family history of Hereditary Spherocytosis, Hypertension, Lung Disease, Seizures, Stroke, Thyroid Problems, Tuberculosis. 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 Medical History Eyes Patient has history of Cataracts - right eye, had sx Denies history of Glaucoma, Optic Neuritis Ear/Nose/Mouth/Throat Denies history of Chronic sinus problems/congestion, Middle ear problems Hematologic/Lymphatic Denies history of Anemia, Hemophilia, Human Immunodeficiency Virus, Lymphedema, Sickle Cell Disease Respiratory Denies history of Aspiration, Asthma, Chronic Obstructive Pulmonary Disease (COPD), Pneumothorax, Sleep Apnea, Tuberculosis Cardiovascular Patient has history of Congestive Heart Failure, Hypertension, Myocardial Infarction Gastrointestinal Patient has history of Hepatitis C Denies history of Cirrhosis , Colitis, Crohnoos, Hepatitis A, Hepatitis B Endocrine Patient has history of Type II Diabetes Genitourinary  Patient has history of 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 Blood sugar is tested. Hospitalization/Surgery History - kidney transplant, 2015. - cataract sx, 2020. - back surgery. Medical A Surgical History  Notes nd Cardiovascular CVA Genitourinary kidney transplant Neurologic vascular dementia Review of Systems (ROS) Constitutional Symptoms (General Health) Denies complaints or symptoms of Fatigue, Fever, Chills, Marked Weight Change. Eyes Complains or has symptoms of Glasses / Contacts - glasses. Ear/Nose/Mouth/Throat Denies complaints or symptoms of Chronic sinus problems or rhinitis. Respiratory Denies complaints or symptoms of Chronic or frequent coughs, Shortness of Breath. Cardiovascular Denies complaints or symptoms of Chest pain. Gastrointestinal Denies complaints or symptoms of Frequent diarrhea, Nausea, Vomiting. Integumentary (Skin) bilateral feet Musculoskeletal Denies complaints or symptoms of Muscle Pain, Muscle Weakness. Neurologic Denies complaints or symptoms of Numbness/parasthesias. Psychiatric Denies complaints or symptoms of Claustrophobia, Suicidal. Objective Constitutional Sitting or standing Blood Pressure is within target range for patient.. Pulse regular and within target range for patient.Marland Kitchen Respirations regular, non-labored and within target range.. Temperature is normal and within the target range for the patient.Marland Kitchen Appears in no distress. Vitals Time Taken: 1:00 PM, Height: 71 in, Source: Stated, Weight: 145 lbs, Source: Stated, BMI: 20.2, Temperature: 98.3 F, Pulse: 91 bpm, Respiratory Rate: 19 breaths/min, Blood Pressure: 129/73 mmHg, Capillary Blood Glucose: 118 mg/dl. General Notes: CBG this morning was 118 Respiratory work of breathing is normal. Cardiovascular Heart rhythm and rate regular, without murmur or gallop.. Popliteal and femoral pulses are palpable.. Neither dorsalis pedis pulse nor the posterior tibial pulses palpable on the right. Psychiatric appears at normal baseline. General Notes: Wound exam; the areas in question include the lateral aspect of the right heel. This had a thick eschar over what was one sick a fair  sized wound. I used a #5 curette to remove this. There are only a tiny open area remaining in this area [facility] had an eschar over the lateral right fifth metatarsal head that was removed when our nurse was washing on intake. This is a small punched out wound. No debridement in this area [facility] has a minuscule open area at the lateral part of the right great toe superiorly. Integumentary (Hair, Skin) Wound #1 status is Open. Original cause of wound was Gradually Appeared. The wound is located on the Right,Lateral Foot. The wound measures 0.5cm length x 0.4cm width x 0.4cm depth; 0.157cm^2 area and 0.063cm^3 volume. There is Fat Layer (Subcutaneous Tissue) Exposed exposed. There is no tunneling noted, however, there is undermining starting at 12:00 and ending at 12:00 with a maximum distance of 0.4cm. There is a small amount of serous drainage noted. The wound margin is well defined and not attached to the wound base. There is medium (34-66%) pink, pale granulation within the wound bed. There is a medium (34-66%) amount of necrotic tissue within the wound bed including Adherent Slough. Wound #2 status is Open. Original cause of wound was Gradually Appeared. The wound is located on the Right,Medial T Great. The wound measures 0.2cm oe length x 0.2cm width x 0.1cm depth; 0.031cm^2 area and 0.003cm^3 volume. There is no tunneling or undermining noted. There is a small amount of serous drainage noted. The wound margin is distinct with the  outline attached to the wound base. There is large (67-100%) pale granulation within the wound bed. There is no necrotic tissue within the wound bed. Wound #3 status is Open. Original cause of wound was Gradually Appeared. The wound is located on the Right,Lateral Calcaneus. The wound measures 1.5cm length x 2.3cm width x 0.1cm depth; 2.71cm^2 area and 0.271cm^3 volume. There is no tunneling or undermining noted. There is a none present amount of drainage noted. The wound  margin is distinct with the outline attached to the wound base. There is no granulation within the wound bed. There is a large (67- 100%) amount of necrotic tissue within the wound bed including Eschar. 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 There was a Selective/Open Wound Skin/Dermis Debridement with a total area of 3.45 sq cm performed by Ricard Dillon., MD. With the following instrument(s): Curette to remove Non-Viable tissue/material. Material removed includes Callus and Skin: Dermis and after achieving pain control using Lidocaine 4% T opical Solution. A time out was conducted at 14:10, prior to the start of the procedure. There was no bleeding. The procedure was tolerated well with a pain level of 0 throughout and a pain level of 0 following the procedure. Post Debridement Measurements: 0.3cm length x 0.3cm width x 0.1cm depth; 0.007cm^3 volume. Character of Wound/Ulcer Post Debridement requires further debridement. Severity of Tissue Post Debridement is: Fat layer exposed. Post procedure Diagnosis Wound #3: Same as Pre-Procedure Plan Follow-up Appointments: Return Appointment in 1 week. Dressing Change Frequency: Change Dressing every other day. Wound Cleansing: May shower with protection. - use a cast protector on all other days not changing the dressings. May shower and wash wound with soap and water. - with dressing changes. Primary Wound Dressing: Wound #1 Right,Lateral  Foot: Calcium Alginate with Silver Wound #2 Right,Medial T Great: oe Calcium Alginate with Silver Wound #3 Right,Lateral Calcaneus: Calcium Alginate with Silver Secondary Dressing: Wound #2 Right,Medial T Great: oe Kerlix/Rolled Gauze Dry Gauze Wound #3 Right,Lateral Calcaneus: Kerlix/Rolled Gauze Dry Gauze Heel Cup Wound #1 Right,Lateral Foot: Foam Border - or gauze and kerlix. Off-Loading: Open toe surgical shoe to: - right foot Other: - float right heel with pillows to offload pressure to heel while resting in chair and bed. Home Health: Admit to [facility] for [facility] home health change twice a week. wound center weekly. Patient is currently receiving PT and OT with [facility] home health. Services and Therapies ordered were: Arterial Studies- Bilateral - Vein and Vascular Arterial studies bilateral with ABIs and TBIs related to nonpalpable pedal pulses. 1. Patient has small areas on the right lateral calcaneus, right lateral fifth metatarsal head and a minuscule area on the medial aspect of the great toe in the webspace 2. Silver alginate to all these areas. These can be changed by home health twice a week and then when he is here 3. I could not feel arterial pulses in the right foot. We will send him for formal arterial studies. 4. In general the wounds seem to have improved by the patient's description  of when he left [place]. We emphasized pressure relief especially when he is in bed. I spent 35 minutes in review of this patient's past medical history, face-to-face evaluation and preparation of this record Electronic Signature(s) Signed: 01/31/2020 5:27:17 PM By: Linton Ham MD Entered By: Linton Ham on 01/30/2020 14:43:06 -------------------------------------------------------------------------------- HxROS Details Patient Name: Date of Service: Jonathan [name], Jonathan [name] 01/30/2020 1:15 PM Medical Record Number: [id number] Patient Account Number:  [id number] Date of Birth/Sex: Treating RN: Aug 12, 1950 (69 y.o. [name] Primary Care Provider: Harvin Hazel M Other Clinician: Referring Provider: Treating Provider/Extender: Jarold Song, Lyman Bishop M Weeks in Treatment: 0 Information Obtained From Patient Constitutional Symptoms (General Health) Complaints and Symptoms: Negative for: Fatigue; Fever; Chills; Marked Weight Change Eyes Complaints and Symptoms: Positive for: Glasses / Contacts - glasses Medical History: Positive for: Cataracts - right eye, had sx Negative for: Glaucoma; Optic Neuritis Ear/Nose/Mouth/Throat Complaints and Symptoms: Negative for: Chronic sinus problems or rhinitis Medical History: Negative for: Chronic sinus problems/congestion; Middle ear problems Respiratory Complaints and Symptoms: Negative for: Chronic or frequent coughs; Shortness of Breath Medical History: Negative for: Aspiration; Asthma; Chronic Obstructive Pulmonary Disease (COPD); Pneumothorax; Sleep Apnea; Tuberculosis Cardiovascular Complaints and Symptoms: Negative for: Chest pain Medical History: Positive for: Congestive Heart Failure; Hypertension; Myocardial Infarction Past Medical History Notes: CVA Gastrointestinal Complaints and Symptoms: Negative for: Frequent diarrhea; Nausea; Vomiting Medical History: Positive for: Hepatitis C Negative for: Cirrhosis ; Colitis; Crohns; Hepatitis A; Hepatitis B Musculoskeletal Complaints and Symptoms: Negative for: Muscle Pain; Muscle Weakness Medical History: Negative for: Gout; Rheumatoid Arthritis; Osteoarthritis; Osteomyelitis Neurologic Complaints and Symptoms: Negative for: Numbness/parasthesias Medical History: Positive for: Dementia; Neuropathy Past Medical History Notes: vascular dementia Psychiatric Complaints and Symptoms: Negative for: Claustrophobia; Suicidal Medical History: Negative for: Anorexia/bulimia; Confinement  Anxiety Hematologic/Lymphatic Medical History: Negative for: Anemia; Hemophilia; Human Immunodeficiency Virus; Lymphedema; Sickle Cell Disease Endocrine Medical History: Positive for: Type II Diabetes Time with diabetes: over 20 years Treated with: Insulin Blood sugar tested every day: Yes Tested : at least 4 times a day Genitourinary Medical History: Positive for: 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 Past Medical History Notes: kidney transplant Immunological Medical History: Negative for: Lupus Erythematosus; Raynauds; Scleroderma Integumentary (Skin) Complaints and Symptoms: Review of System Notes: bilateral feet Medical History: Negative for: History of Burn Oncologic Medical History: Negative for: Received Chemotherapy; Received Radiation HBO Extended History Items Eyes: Cataracts Immunizations Pneumococcal Vaccine: Received Pneumococcal Vaccination: Yes Implantable Devices None Hospitalization / Surgery History Type of Hospitalization/Surgery kidney transplant, 2015 cataract sx, 2020 back surgery Family and Social History Cancer: Yes - Mother,Siblings; Diabetes: Yes - Maternal Grandparents,Paternal Grandparents,Father,Siblings; Heart Disease: Yes - Mother; Hereditary Spherocytosis: No; Hypertension: No; Kidney Disease: Yes - Siblings; Lung Disease: No; Seizures: No; Stroke: No; Thyroid Problems: No; Tuberculosis: No; Former smoker; Marital Status - Married; 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; Drug Use: No History; Caffeine Use: Moderate; Financial Concerns: No; Food, Clothing or Shelter Needs: No; Support System Lacking: No; Transportation Concerns: No Electronic Signature(s) Signed: 01/30/2020 5:29:15 PM By: Kela Millin Signed: 01/31/2020 5:27:17 PM By: Linton Ham MD Entered By: Kela Millin on 01/30/2020 13:27:38 -------------------------------------------------------------------------------- SuperBill Details Patient Name: Date of  Service: Jonathan [name], Jonathan [name] 01/30/2020 Medical Record Number: [id number] Patient Account Number: [id number] Date of Birth/Sex: Treating RN: Jan 19, 1951 (69 y.o. 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 E11.51 Type 2 diabetes mellitus with diabetic peripheral angiopathy without gangrene Facility Procedures CPT4 Code: 84730856 Description: 99214 - WOUND CARE VISIT-LEV 4 EST PT Modifier: Quantity: 1 CPT4 Code: 94370052 Description: 59102 - DEBRIDE WOUND 1ST 20 SQ CM OR < ICD-10 Diagnosis Description E11.621 Type 2 diabetes mellitus with foot ulcer Modifier: Quantity: 1 Physician Procedures : CPT4 Code Description Modifier [id number] WC PHYS LEVEL 3 NEW PT 25 ICD-10 Diagnosis Description E11.621 Type 2 diabetes mellitus with foot ulcer L97.512 Non-pressure chronic ulcer of other part of right foot with fat layer exposed L97.411 Non-pressure  chronic ulcer of right heel and midfoot limited to breakdown of skin E11.51 Type 2 diabetes mellitus with diabetic peripheral angiopathy without gangrene Quantity: 1 : [id number] 83073 - WC PHYS DEBR WO ANESTH 20 SQ CM ICD-10 Diagnosis Description E11.621 Type 2 diabetes mellitus with foot ulcer Quantity: 1 Electronic Signature(s) Signed: 01/31/2020 5:27:17 PM By: Linton Ham MD Entered By: Linton Ham on 01/30/2020 14:43:38

## 2020-01-31 NOTE — Progress Notes (Signed)
Jonathan [name], Jonathan [name] ([id number]) Visit Report for 01/30/2020 Allergy List Details Patient Name: Date of Service: Jonathan [name], Jonathan [name] 01/30/2020 1:15 PM Medical Record Number: [id number] Patient Account Number: [id number] Date of Birth/Sex: Treating RN: 09/06/1950 (69 y.o. Jonathan [name] Primary Care Laporshia Hogen: Harvin Hazel M Other Clinician: Referring Shade Kaley: Treating Sumiye Hirth/Extender: Jarold Song, Lyman Bishop M Weeks in Treatment: 0 Allergies Active Allergies No Known Allergies Allergy Notes Electronic Signature(s) Signed: 01/30/2020 5:29:15 PM By: Kela Millin Entered By: Kela Millin on 01/30/2020 13:15:18 -------------------------------------------------------------------------------- Arrival Information Details Patient Name: Date of Service: Jonathan [name], Jonathan [name] 01/30/2020 1:15 PM Medical Record Number: [id number] Patient Account Number: [id number] Date of Birth/Sex: Treating RN: [birth year] (69 y.o. Jonathan [name] Primary Care Shimika Ames: Harvin Hazel M Other Clinician: Referring Alfred Eckley: Treating Kenecia Barren/Extender: Jarold Song, Elder Negus Weeks in Treatment: 0 Visit Information Patient Arrived: Wheel Chair Arrival Time: 13:12 Accompanied By: wife Transfer Assistance: None Patient Identification Verified: Yes Secondary Verification Process Completed: Yes Patient Has Alerts: Yes Patient Alerts: Patient on Blood Thinner Right ABI: non compress Electronic Signature(s) Signed: 01/30/2020 5:29:15 PM By: Kela Millin Entered By: Kela Millin on 01/30/2020 13:49:26 -------------------------------------------------------------------------------- Clinic Level of Care Assessment Details Patient Name: Date of Service: Jonathan, [name] 01/30/2020 1:15 PM Medical Record Number: [id number] Patient Account Number: [id number] Date of Birth/Sex: Treating RN: 10/24/1950 (69 y.o. Hessie Diener Primary Care Lamees Gable: Harvin Hazel M Other Clinician: Referring  Aireana Ryland: Treating Theadore Blunck/Extender: Jarold Song, Lyman Bishop M Weeks in Treatment: 0 Clinic Level of Care Assessment Items TOOL 1 Quantity Score X- 1 0 Use when EandM and Procedure is performed on INITIAL visit ASSESSMENTS - Nursing Assessment / Reassessment X- 1 20 General Physical Exam (combine w/ comprehensive assessment (listed just below) when performed on new pt. evals) X- 1 25 Comprehensive Assessment (HX, ROS, Risk Assessments, Wounds Hx, etc.) ASSESSMENTS - Wound and Skin Assessment / Reassessment X- 1 10 Dermatologic / Skin Assessment (not related to wound area) ASSESSMENTS - Ostomy and/or Continence Assessment and Care [] - 0 Incontinence Assessment and Management [] - 0 Ostomy Care Assessment and Management (repouching, etc.) PROCESS - Coordination of Care [] - 0 Simple Patient / Family Education for ongoing care X- 1 20 Complex (extensive) Patient / Family Education for ongoing care X- 1 10 Staff obtains Programmer, systems, Records, T Results / Process Orders est X- 1 10 Staff telephones HHA, Nursing Homes / Clarify orders / etc [] - 0 Routine Transfer to another Facility (non-emergent condition) [] - 0 Routine Hospital Admission (non-emergent condition) X- 1 15 New Admissions / Biomedical engineer / Ordering NPWT Apligraf, etc. , [] - 0 Emergency Hospital Admission (emergent condition) PROCESS - Special Needs [] - 0 Pediatric / Minor Patient Management [] - 0 Isolation Patient Management [] - 0 Hearing / Language / Visual special needs [] - 0 Assessment of Community assistance (transportation, D/C planning, etc.) [] - 0 Additional assistance / Altered mentation [] - 0 Support Surface(s) Assessment (bed, cushion, seat, etc.) INTERVENTIONS - Miscellaneous [] - 0 External ear exam [] - 0 Patient Transfer (multiple staff / Civil Service fast streamer / Similar devices) [] - 0 Simple Staple / Suture removal (25 or less) [] - 0 Complex Staple / Suture removal (26  or more) [] - 0 Hypo/Hyperglycemic Management (do not check if billed separately) X- 1 15 Ankle / Brachial Index (ABI) - do not check if billed separately Has the patient been seen at the hospital within the last three years: Yes  Total Score: 125 Level Of Care: New/Established - Level 4 Electronic Signature(s) Signed: 01/30/2020 5:34:37 PM By: Deon Pilling Entered By: Deon Pilling on 01/30/2020 14:21:54 -------------------------------------------------------------------------------- Encounter Discharge Information Details Patient Name: Date of Service: Jonathan [name], Jonathan [name] 01/30/2020 1:15 PM Medical Record Number: [id number] Patient Account Number: [id number] Date of Birth/Sex: Treating RN: [birth year] (69 y.o. [name] Primary Care Antoniette Peake: Harvin Hazel M Other Clinician: Referring Crixus Mcaulay: Treating Kerrick Miler/Extender: Jarold Song, Lyman Bishop M Weeks in Treatment: 0 Encounter Discharge Information Items Post Procedure Vitals Discharge Condition: Stable Temperature (F): 98.3 Ambulatory Status: Wheelchair Pulse (bpm): 91 Discharge Destination: Home Respiratory Rate (breaths/min): 18 Transportation: Private Auto Blood Pressure (mmHg): 129/73 Accompanied By: mother Schedule Follow-up Appointment: Yes Clinical Summary of Care: Patient Declined Electronic Signature(s) Signed: 01/30/2020 5:20:52 PM By: Baruch Gouty RN, BSN Entered By: Baruch Gouty on 01/30/2020 14:52:43 -------------------------------------------------------------------------------- Lower Extremity Assessment Details Patient Name: Date of Service: Jonathan [name], Jonathan [name] 01/30/2020 1:15 PM Medical Record Number: [id number] Patient Account Number: [id number] Date of Birth/Sex: Treating RN: 1951-03-03 (69 y.o. Jonathan [name] Primary Care Paquita Printy: Harvin Hazel M Other Clinician: Referring Jazzlin Clements: Treating Anaalicia Reimann/Extender: Jarold Song, Lyman Bishop M Weeks in Treatment: 0 Edema  Assessment Assessed: Shirlyn Goltz: No] [Right: No] E[Left: dema] [Right: :] Calf Left: Right: Point of Measurement: 41 cm From Medial Instep cm 35.5 cm Ankle Left: Right: Point of Measurement: 18 cm From Medial Instep cm 19.5 cm Vascular Assessment Pulses: Dorsalis Pedis Palpable: [Right:No] Electronic Signature(s) Signed: 01/30/2020 5:29:15 PM By: Kela Millin Entered By: Kela Millin on 01/30/2020 13:49:46 -------------------------------------------------------------------------------- Multi Wound Chart Details Patient Name: Date of Service: Jonathan [name], Jonathan [name] 01/30/2020 1:15 PM Medical Record Number: [id number] Patient Account Number: [id number] Date of Birth/Sex: Treating RN: 1951-05-31 (69 y.o. [name], Meta.Reding Primary Care Jaidah Lomax: Harvin Hazel M Other Clinician: Referring Martel Galvan: Treating Briane Birden/Extender: Jarold Song, Lyman Bishop M Weeks in Treatment: 0 Vital Signs Height(in): 71 Capillary Blood Glucose(mg/dl): 118 Weight(lbs): 145 Pulse(bpm): 56 Body Mass Index(BMI): 20 Blood Pressure(mmHg): 129/73 Temperature(F): 98.3 Respiratory Rate(breaths/min): 19 Photos: [1:No Photos Right, Lateral Foot] [2:No Photos Right, Medial T Great oe] [3:No Photos Right, Lateral Calcaneus] Wound Location: [1:Gradually Appeared] [2:Gradually Appeared] [3:Gradually Appeared] Wounding Event: [1:Diabetic Wound/Ulcer of the Lower] [2:Diabetic Wound/Ulcer of the Lower] [3:Diabetic Wound/Ulcer of the Lower] Primary Etiology: [1:Extremity Cataracts, Congestive Heart Failure,] [2:Extremity Cataracts, Congestive Heart Failure,] [3:Extremity Cataracts, Congestive Heart Failure,] Comorbid History: [1:Hypertension, Myocardial Infarction, Hepatitis C, Type II Diabetes, End Stage Renal Disease, Dementia, Neuropathy 12/26/2019] [2:Hypertension, Myocardial Infarction, Hepatitis C, Type II Diabetes, End Stage Renal Disease, Dementia,  Neuropathy 01/22/2020] [3:Hypertension, Myocardial  Infarction, Hepatitis C, Type II Diabetes, End Stage Renal Disease, Dementia, Neuropathy 12/24/2019] Date Acquired: [1:0] [2:0] [3:0] Weeks of Treatment: [1:Open] [2:Open] [3:Open] Wound Status: [1:0.5x0.4x0.4] [2:0.2x0.2x0.1] [3:1.5x2.3x0.1] Measurements L x W x D (cm) [1:0.157] [2:0.031] [3:2.71] A (cm) : rea [1:0.063] [2:0.003] [3:0.271] Volume (cm) : [1:12] Starting Position 1 (o'clock): [1:12] Ending Position 1 (o'clock): [1:0.4] Maximum Distance 1 (cm): [1:Yes] [2:No] [3:No] Undermining: [1:Grade 2] [2:Grade 1] [3:Grade 2] Classification: [1:Small] [2:Small] [3:None Present] Exudate A mount: [1:Serous] [2:Serous] [3:N/A] Exudate Type: [1:amber] [2:amber] [3:N/A] Exudate Color: [1:Well defined, not attached] [2:Distinct, outline attached] [3:Distinct, outline attached] Wound Margin: [1:Medium (34-66%)] [2:Large (67-100%)] [3:None Present (0%)] Granulation A mount: [1:Pink, Pale] [2:Pale] [3:N/A] Granulation Quality: [1:Medium (34-66%)] [2:None Present (0%)] [3:Large (67-100%)] Necrotic A mount: [1:Adherent Slough] [2:N/A] [3:Eschar] Necrotic Tissue: [1:Fat Layer (Subcutaneous Tissue)] [2:Fascia: No] [3:Fascia: No] Exposed Structures: [1:Exposed: Yes Fascia: No Tendon: No Muscle:  No Joint: No Bone: No None] [2:Fat Layer (Subcutaneous Tissue) Exposed: No Tendon: No Muscle: No Joint: No Bone: No None] [3:Fat Layer (Subcutaneous Tissue) Exposed: No Tendon: No Muscle: No Joint: No Bone:  No None] Epithelialization: [1:N/A] [2:N/A] [3:Debridement - Selective/Open Wound] Debridement: Pre-procedure Verification/Time Out N/A [2:N/A] [3:14:10] Taken: [1:N/A] [2:N/A] [3:Lidocaine 4% Topical Solution] Pain Control: [1:N/A] [2:N/A] [3:Callus] Tissue Debrided: [1:N/A] [2:N/A] [3:Non-Viable Tissue] Level: [1:N/A] [2:N/A] [3:3.45] Debridement A (sq cm): [1:rea N/A] [2:N/A] [3:Curette] Instrument: [1:N/A] [2:N/A] [3:None] Bleeding: [1:N/A] [2:N/A] [3:0] Procedural Pain: [1:N/A] [2:N/A]  [3:0] Post Procedural Pain: [1:N/A] [2:N/A] [3:Procedure was tolerated well] Debridement Treatment Response: [1:N/A] [2:N/A] [3:0.3x0.3x0.1] Post Debridement Measurements L x W x D (cm) [1:N/A] [2:N/A] [3:0.007] Post Debridement Volume: (cm) [1:N/A] [2:N/A] [3:mixed with callous] Assessment Notes: [1:N/A] [2:N/A] [3:Debridement] Treatment Notes Electronic Signature(s) Signed: 01/30/2020 5:34:37 PM By: Deon Pilling Signed: 01/31/2020 5:27:17 PM By: Linton Ham MD Entered By: Linton Ham on 01/30/2020 14:35:12 -------------------------------------------------------------------------------- Multi-Disciplinary Care Plan Details Patient Name: Date of Service: DVONTE, GATLIFF [name] 01/30/2020 1:15 PM Medical Record Number: [id number] Patient Account Number: [id number] Date of Birth/Sex: Treating RN: 15-Dec-1950 (69 y.o. [name], Meta.Reding Primary Care : Harvin Hazel M Other Clinician: Referring : Treating /Extender: Jarold Song, Lyman Bishop M Weeks in Treatment: 0 Active Inactive Nutrition Nursing Diagnoses: Potential for alteratiion in Nutrition/Potential for imbalanced nutrition Goals: Patient/caregiver agrees to and verbalizes understanding of need to obtain nutritional consultation Date Initiated: 01/30/2020 Target Resolution Date: 02/14/2020 Goal Status: Active Interventions: Provide education on elevated blood sugars and impact on wound healing Provide education on nutrition Treatment Activities: Patient referred to Primary Care Physician for further nutritional evaluation : 01/30/2020 Notes: Orientation to the Wound Care Program Nursing Diagnoses: Knowledge deficit related to the wound healing center program Goals: Patient/caregiver will verbalize understanding of the [place] Date Initiated: 01/30/2020 Target Resolution Date: 02/14/2020 Goal Status: Active Interventions: Provide education on orientation to the wound  center Notes: Pain, Acute or Chronic Nursing Diagnoses: Pain, acute or chronic: actual or potential Potential alteration in comfort, pain Goals: Patient will verbalize adequate pain control and receive pain control interventions during procedures as needed Date Initiated: 01/30/2020 Target Resolution Date: 02/13/2020 Goal Status: Active Patient/caregiver will verbalize comfort level met Date Initiated: 01/30/2020 Target Resolution Date: 02/14/2020 Goal Status: Active Interventions: Encourage patient to take pain medications as prescribed Provide education on pain management Reposition patient for comfort Treatment Activities: Administer pain control measures as ordered : 01/30/2020 Notes: Wound/Skin Impairment Nursing Diagnoses: Knowledge deficit related to ulceration/compromised skin integrity Goals: Patient/caregiver will verbalize understanding of skin care regimen Date Initiated: 01/30/2020 Target Resolution Date: 02/14/2020 Goal Status: Active Ulcer/skin breakdown will have a volume reduction of 50% by week 8 Date Initiated: 01/30/2020 Target Resolution Date: 03/31/2020 Goal Status: Active Interventions: Assess patient/caregiver ability to obtain necessary supplies Assess patient/caregiver ability to perform ulcer/skin care regimen upon admission and as needed Provide education on ulcer and skin care Treatment Activities: Skin care regimen initiated : 01/30/2020 Topical wound management initiated : 01/30/2020 Notes: Electronic Signature(s) Signed: 01/30/2020 5:34:37 PM By: Deon Pilling Entered By: Deon Pilling on 01/30/2020 13:39:06 -------------------------------------------------------------------------------- Pain Assessment Details Patient Name: Date of Service: Jonathan [name], Jonathan [name] 01/30/2020 1:15 PM Medical Record Number: [id number] Patient Account Number: [id number] Date of Birth/Sex: Treating RN: 05-24-1951 (69 y.o. Jonathan [name] Primary Care : Harvin Hazel M Other Clinician: Referring : Treating /Extender: Jarold Song, Lyman Bishop M Weeks in Treatment: 0 Active Problems Location of Pain Severity  and Description of Pain Patient Has Paino No Site Locations Pain Management and Medication Current Pain Management: Electronic Signature(s) Signed: 01/30/2020 5:29:15 PM By: Kela Millin Entered By: Kela Millin on 01/30/2020 13:56:24 -------------------------------------------------------------------------------- Patient/Caregiver Education Details Patient Name: Date of Service: Jonathan [name], Jonathan [name] 6/24/2021andnbsp1:15 PM Medical Record Number: [id number] Patient Account Number: [id number] Date of Birth/Gender: Treating RN: 1950/09/09 (69 y.o. Hessie Diener Primary Care Physician: Marykay Lex Other Clinician: Referring Physician: Treating Physician/Extender: Jarold Song, Elder Negus Weeks in Treatment: 0 Education Assessment Education Provided To: Patient Education Topics Provided Elevated Blood Sugar/ Impact on Healing: Handouts: Elevated Blood Sugars: How Do They Affect Wound Healing Methods: Explain/Verbal Responses: Reinforcements needed Electronic Signature(s) Signed: 01/30/2020 5:34:37 PM By: Deon Pilling Entered By: Deon Pilling on 01/30/2020 13:39:14 -------------------------------------------------------------------------------- Wound Assessment Details Patient Name: Date of Service: Jonathan [name], Jonathan [name] 01/30/2020 1:15 PM Medical Record Number: [id number] Patient Account Number: [id number] Date of Birth/Sex: Treating RN: [birth year] (69 y.o. Jonathan [name] Primary Care : Harvin Hazel M Other Clinician: Referring : Treating /Extender: Jarold Song, Lyman Bishop M Weeks in Treatment: 0 Wound Status Wound Number: 1 Primary Diabetic Wound/Ulcer of the Lower Extremity Etiology: Wound Location: Right, Lateral Foot Wound Open Wounding Event:  Gradually Appeared Status: Date Acquired: 12/26/2019 Comorbid Cataracts, Congestive Heart Failure, Hypertension, Myocardial Weeks Of Treatment: 0 History: Infarction, Hepatitis C, Type II Diabetes, End Stage Renal Disease, Clustered Wound: No Dementia, Neuropathy Wound Measurements Length: (cm) 0.5 Width: (cm) 0.4 Depth: (cm) 0.4 Area: (cm) 0.157 Volume: (cm) 0.063 % Reduction in Area: % Reduction in Volume: Epithelialization: None Tunneling: No Undermining: Yes Starting Position (o'clock): 12 Ending Position (o'clock): 12 Maximum Distance: (cm) 0.4 Wound Description Classification: Grade 2 Wound Margin: Well defined, not attached Exudate Amount: Small Exudate Type: Serous Exudate Color: amber Foul Odor After Cleansing: No Slough/Fibrino Yes Wound Bed Granulation Amount: Medium (34-66%) Exposed Structure Granulation Quality: Pink, Pale Fascia Exposed: No Necrotic Amount: Medium (34-66%) Fat Layer (Subcutaneous Tissue) Exposed: Yes Necrotic Quality: Adherent Slough Tendon Exposed: No Muscle Exposed: No Joint Exposed: No Bone Exposed: No Treatment Notes Wound #1 (Right, Lateral Foot) 3. Primary Dressing Applied Calcium Alginate Ag 4. Secondary Dressing Dry Gauze Roll Gauze Heel Cup 7. Footwear/Offloading device applied Surgical shoe Electronic Signature(s) Signed: 01/30/2020 5:29:15 PM By: Kela Millin Entered By: Kela Millin on 01/30/2020 13:51:30 -------------------------------------------------------------------------------- Wound Assessment Details Patient Name: Date of Service: Jonathan [name], Jonathan [name] 01/30/2020 1:15 PM Medical Record Number: [id number] Patient Account Number: [id number] Date of Birth/Sex: Treating RN: 12/06/1950 (69 y.o. Jonathan [name] Primary Care : Harvin Hazel M Other Clinician: Referring : Treating /Extender: Jarold Song, Lyman Bishop M Weeks in Treatment: 0 Wound Status Wound Number: 2  Primary Diabetic Wound/Ulcer of the Lower Extremity Etiology: Wound Location: Right, Medial T Great oe Wound Open Wounding Event: Gradually Appeared Status: Date Acquired: 01/22/2020 Comorbid Cataracts, Congestive Heart Failure, Hypertension, Myocardial Weeks Of Treatment: 0 History: Infarction, Hepatitis C, Type II Diabetes, End Stage Renal Disease, Clustered Wound: No Dementia, Neuropathy Wound Measurements Length: (cm) 0.2 Width: (cm) 0.2 Depth: (cm) 0.1 Area: (cm) 0.031 Volume: (cm) 0.003 % Reduction in Area: % Reduction in Volume: Epithelialization: None Tunneling: No Undermining: No Wound Description Classification: Grade 1 Wound Margin: Distinct, outline attached Exudate Amount: Small Exudate Type: Serous Exudate Color: amber Foul Odor After Cleansing: No Slough/Fibrino No Wound Bed Granulation Amount: Large (67-100%) Exposed Structure Granulation Quality: Pale Fascia Exposed: No Necrotic Amount: None Present (0%) Fat Layer (Subcutaneous Tissue) Exposed:  No Tendon Exposed: No Muscle Exposed: No Joint Exposed: No Bone Exposed: No Treatment Notes Wound #2 (Right, Medial Toe Great) 3. Primary Dressing Applied Calcium Alginate Ag 4. Secondary Dressing Dry Gauze Roll Gauze Heel Cup 7. Footwear/Offloading device applied Surgical shoe Electronic Signature(s) Signed: 01/30/2020 5:29:15 PM By: Kela Millin Entered By: Kela Millin on 01/30/2020 13:52:34 -------------------------------------------------------------------------------- Wound Assessment Details Patient Name: Date of Service: Jonathan [name], Jonathan [name] 01/30/2020 1:15 PM Medical Record Number: [id number] Patient Account Number: [id number] Date of Birth/Sex: Treating RN: November 10, 1950 (69 y.o. Jonathan [name] Primary Care : Harvin Hazel M Other Clinician: Referring : Treating /Extender: Jarold Song, Lyman Bishop M Weeks in Treatment: 0 Wound Status Wound Number:  3 Primary Diabetic Wound/Ulcer of the Lower Extremity Etiology: Wound Location: Right, Lateral Calcaneus Wound Open Wounding Event: Gradually Appeared Status: Date Acquired: 12/24/2019 Comorbid Cataracts, Congestive Heart Failure, Hypertension, Myocardial Weeks Of Treatment: 0 History: Infarction, Hepatitis C, Type II Diabetes, End Stage Renal Disease, Clustered Wound: No Dementia, Neuropathy Wound Measurements Length: (cm) 1.5 Width: (cm) 2.3 Depth: (cm) 0.1 Area: (cm) 2.71 Volume: (cm) 0.271 % Reduction in Area: % Reduction in Volume: Epithelialization: None Tunneling: No Undermining: No Wound Description Classification: Grade 2 Wound Margin: Distinct, outline attached Exudate Amount: None Present Foul Odor After Cleansing: No Slough/Fibrino No Wound Bed Granulation Amount: None Present (0%) Exposed Structure Necrotic Amount: Large (67-100%) Fascia Exposed: No Necrotic Quality: Eschar Fat Layer (Subcutaneous Tissue) Exposed: No Tendon Exposed: No Muscle Exposed: No Joint Exposed: No Bone Exposed: No Assessment Notes mixed with callous Treatment Notes Wound #3 (Right, Lateral Calcaneus) 3. Primary Dressing Applied Calcium Alginate Ag 4. Secondary Dressing Dry Gauze Roll Gauze Heel Cup 7. Footwear/Offloading device applied Surgical shoe Electronic Signature(s) Signed: 01/30/2020 5:29:15 PM By: Kela Millin Entered By: Kela Millin on 01/30/2020 13:56:02 -------------------------------------------------------------------------------- Vitals Details Patient Name: Date of Service: Jonathan [name], Jonathan [name] 01/30/2020 1:15 PM Medical Record Number: [id number] Patient Account Number: [id number] Date of Birth/Sex: Treating RN: 1951/01/15 (69 y.o. Jonathan [name] Primary Care : Harvin Hazel M Other Clinician: Referring : Treating /Extender: Jarold Song, Lyman Bishop M Weeks in Treatment: 0 Vital Signs Time Taken:  13:00 Temperature (F): 98.3 Height (in): 71 Pulse (bpm): 91 Source: Stated Respiratory Rate (breaths/min): 19 Weight (lbs): 145 Blood Pressure (mmHg): 129/73 Source: Stated Capillary Blood Glucose (mg/dl): 118 Body Mass Index (BMI): 20.2 Reference Range: 80 - 120 mg / dl Notes CBG this morning was 118 Electronic Signature(s) Signed: 01/30/2020 5:29:15 PM By: Kela Millin Entered By: Kela Millin on 01/30/2020 13:14:53

## 2020-02-04 ENCOUNTER — Encounter (HOSPITAL_BASED_OUTPATIENT_CLINIC_OR_DEPARTMENT_OTHER): Payer: Medicare Other | Admitting: Internal Medicine

## 2020-02-06 ENCOUNTER — Encounter (HOSPITAL_BASED_OUTPATIENT_CLINIC_OR_DEPARTMENT_OTHER): Payer: Medicare Other | Attending: Internal Medicine | Admitting: Internal Medicine

## 2020-02-06 ENCOUNTER — Other Ambulatory Visit: Payer: Self-pay

## 2020-02-06 DIAGNOSIS — L97411 Non-pressure chronic ulcer of right heel and midfoot limited to breakdown of skin: Secondary | ICD-10-CM | POA: Diagnosis not present

## 2020-02-06 DIAGNOSIS — N186 End stage renal disease: Secondary | ICD-10-CM | POA: Insufficient documentation

## 2020-02-06 DIAGNOSIS — Z992 Dependence on renal dialysis: Secondary | ICD-10-CM | POA: Insufficient documentation

## 2020-02-06 DIAGNOSIS — E1151 Type 2 diabetes mellitus with diabetic peripheral angiopathy without gangrene: Secondary | ICD-10-CM | POA: Diagnosis not present

## 2020-02-06 DIAGNOSIS — I132 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 kidney disease, or end stage renal disease: Secondary | ICD-10-CM | POA: Diagnosis not present

## 2020-02-06 DIAGNOSIS — I5020 Unspecified systolic (congestive) heart failure: Secondary | ICD-10-CM | POA: Insufficient documentation

## 2020-02-06 DIAGNOSIS — F0150 Vascular dementia without behavioral disturbance: Secondary | ICD-10-CM | POA: Diagnosis not present

## 2020-02-06 DIAGNOSIS — E1140 Type 2 diabetes mellitus with diabetic neuropathy, unspecified: Secondary | ICD-10-CM | POA: Diagnosis not present

## 2020-02-06 DIAGNOSIS — E1122 Type 2 diabetes mellitus with diabetic chronic kidney disease: Secondary | ICD-10-CM | POA: Diagnosis not present

## 2020-02-06 DIAGNOSIS — Z940 Kidney transplant status: Secondary | ICD-10-CM | POA: Insufficient documentation

## 2020-02-06 DIAGNOSIS — Z8616 Personal history of COVID-19: Secondary | ICD-10-CM | POA: Insufficient documentation

## 2020-02-06 DIAGNOSIS — I252 Old myocardial infarction: Secondary | ICD-10-CM | POA: Diagnosis not present

## 2020-02-06 DIAGNOSIS — L97512 Non-pressure chronic ulcer of other part of right foot with fat layer exposed: Secondary | ICD-10-CM | POA: Insufficient documentation

## 2020-02-06 DIAGNOSIS — I2510 Atherosclerotic heart disease of native coronary artery without angina pectoris: Secondary | ICD-10-CM | POA: Insufficient documentation

## 2020-02-06 DIAGNOSIS — E11621 Type 2 diabetes mellitus with foot ulcer: Secondary | ICD-10-CM | POA: Insufficient documentation

## 2020-02-06 DIAGNOSIS — Z8619 Personal history of other infectious and parasitic diseases: Secondary | ICD-10-CM | POA: Insufficient documentation

## 2020-02-07 NOTE — Progress Notes (Signed)
Jonathan [name], Jonathan [name] ([id number]) Visit Report for 02/06/2020 HPI Details Patient Name: Date of Service: Jonathan [name], Jonathan [name] 02/06/2020 1:45 PM Medical Record Number: [id number] Patient Account Number: [id number] Date of Birth/Sex: Treating RN: Dec 13, 1950 (69 y.o. [name] Primary Care Provider: Marykay Lex Other Clinician: Referring Provider: Treating Provider/Extender: Jarold Song, Lyman Bishop M Weeks in Treatment: 1 History of Present Illness HPI Description: ADMISSION 01/30/2020 This is a 69 year old man who was hospitalized with Covid for a month in January 2021. Subsequently he was discharged to [facility] skilled facility for rehabilitation. His wife states that when he got home he had areas on the right lateral heel, right lateral fifth metatarsal head. They have been using Santyl to the right lateral heel no dressing on the right lateral foot. Our intake nurse discovered in small area on the right lateral great toe. He is minimally ambulatory at this point. He does not have a known history of PAD although he is a diabetic. 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 Still a small open area on the medial ffirth metatarsal head. But this is also better. He has not heard anything about his arterial studies that we ordered Electronic Signature(s) Signed: 02/06/2020 5:47:00 PM By: Linton Ham MD Entered By: Linton Ham on 02/06/2020  15:56:18 -------------------------------------------------------------------------------- Physical Exam Details Patient Name: Date of Service: Jonathan [name], Jonathan [name] 02/06/2020 1:45 PM Medical Record Number: [id number] Patient Account Number: [id number] Date of Birth/Sex: Treating RN: Oct 23, 1950 (69 y.o. [name] Primary Care Provider: Harvin Hazel M Other Clinician: Referring Provider: Treating Provider/Extender: Jarold Song, Lyman Bishop M Weeks in Treatment: 1 Constitutional Sitting or standing Blood Pressure is within target range for patient.. Pulse regular and within target range for patient.Marland Kitchen Respirations regular, non-labored and within target range.. Temperature is normal and within the target range for the patient.Marland Kitchen Appears in no distress. Cardiovascular Palpable popliteal. Pedal pulses are absent. . Notes Wound exam; the area in question include the lateral aspect of the right heel. This is not open but there is eschar. The small open area that was there last time is closed over On the right lateral fifth met head a small wound that should be closed next time The small punched out area on the right first toe was also Electronic Signature(s) Signed: 02/06/2020 5:47:00 PM By: Linton Ham MD Entered By: Linton Ham on 02/06/2020 15:56:39 -------------------------------------------------------------------------------- Physician Orders Details Patient Name: Date of Service: Jonathan [name], Jonathan [name] 02/06/2020 1:45 PM Medical Record Number: [id number] Patient Account Number: [id number] Date of Birth/Sex: Treating RN: [birth year] (69 y.o. 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 Dressing Change Frequency Wound #1 Right,Lateral Foot Change Dressing every other day. Wound Cleansing Wound #1 Right,Lateral Foot May shower with protection. - use a cast protector on all other days not changing the dressings. May shower and wash wound with soap and water. - with dressing changes. Primary Wound Dressing Wound #1 Right,Lateral Foot Calcium Alginate with Silver Secondary Dressing Wound #1 Right,Lateral Foot Foam Border - or gauze and kerlix Off-Loading Open toe surgical shoe to: - right foot Other: - float right heel with pillows to offload pressure to heel while resting in chair and bed. [facility] skilled nursing for wound care. Alvis Lemmings Electronic Signature(s) Signed: 02/06/2020 5:47:00 PM By: Linton Ham MD Signed: 02/07/2020 4:42:40 PM By: Levan Hurst RN, BSN Entered By: Levan Hurst on 02/06/2020 15:26:23 -------------------------------------------------------------------------------- Problem List Details Patient Name: Date of Service: Jonathan [name], Jonathan [name] 02/06/2020 1:45 PM Medical Record Number: [id number] Patient Account Number: [id number] Date of Birth/Sex: Treating RN: 1951/02/16 (69 y.o. [name] Primary Care Provider: Harvin Hazel M Other Clinician: Referring Provider: Treating Provider/Extender: Jarold Song, Lyman Bishop M Weeks in Treatment: 1 Active Problems ICD-10 Encounter Code Description Active Date MDM Diagnosis E11.621 Type 2 diabetes mellitus with foot ulcer 01/30/2020 No Yes L97.512 Non-pressure chronic ulcer of other part of right foot with fat layer exposed 01/30/2020 No Yes L97.411 Non-pressure chronic ulcer of right heel and  midfoot limited to breakdown of 01/30/2020 No Yes skin E11.51 Type 2 diabetes mellitus with diabetic peripheral angiopathy without gangrene 01/30/2020 No Yes Inactive Problems Resolved Problems Electronic Signature(s) Signed: 02/06/2020 5:47:00 PM By: Linton Ham MD Entered By: Linton Ham on 02/06/2020 15:51:00 -------------------------------------------------------------------------------- Progress Note Details Patient Name: Date of Service: Jonathan [name], Jonathan [name] 02/06/2020 1:45 PM Medical Record Number: [id number] Patient Account Number: [id number] Date of Birth/Sex: Treating RN: Dec 13, 1950 (69 y.o. [name] Primary Care Provider: Harvin Hazel M Other Clinician: Referring Provider: Treating Provider/Extender: Jarold Song, Lyman Bishop M Weeks in Treatment: 1 Subjective History of Present Illness (HPI) ADMISSION 01/30/2020 This is a 69 year old man who was hospitalized with Covid for a month in January 2021. Subsequently he was discharged to [facility] skilled facility for rehabilitation. His wife states that when he got home he had areas on the right lateral heel, right lateral fifth metatarsal head. They have been using Santyl to the right lateral heel no dressing on the right lateral foot. Our intake nurse discovered in small area on the right lateral great toe. He is minimally ambulatory at this point. He does not have a known history of PAD although he is a diabetic. 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 Still a small open area on the medial ffirth metatarsal head. But this is also better. He has not heard anything about his arterial studies that we ordered Objective Constitutional Sitting or standing Blood Pressure is within target range for patient.. Pulse regular and within target range for patient.Marland Kitchen Respirations regular, non-labored and within target range.. Temperature is normal and within  the target range for the patient.Marland Kitchen Appears in no distress. Vitals Time Taken: 2:26 PM, Height: 71 in, Weight: 145 lbs, BMI: 20.2, Temperature: 98.5 F, Pulse: 96 bpm, Respiratory Rate: 16 breaths/min, Blood Pressure: 146/82 mmHg, Capillary Blood Glucose: 126 mg/dl. General Notes: glucose per pt report Cardiovascular Palpable popliteal. Pedal pulses are absent. General Notes: Wound exam; the area in question include the lateral aspect of the right heel. This is not open but there is eschar. The small open area that was there last time is closed over ooOn the right lateral fifth met head a small wound that should be closed next time ooThe small punched out area on the right first toe was also Integumentary (Hair, Skin) Wound #1 status is Open. Original cause of wound was Gradually Appeared. The wound is located on the Right,Lateral Foot. The wound measures 0.3cm length x 0.2cm width x 0.1cm depth; 0.047cm^2 area and 0.005cm^3 volume. There is Fat Layer (Subcutaneous Tissue) Exposed exposed. There is no tunneling or undermining noted. There is a small amount of serous drainage noted. The wound margin is well defined and not attached to the wound base. There is medium (34-66%) pink, pale granulation within the wound bed. There is a medium (34-66%) amount of necrotic tissue within the wound bed including Adherent Slough. Wound #2 status is Open. Original cause of wound was Gradually Appeared. The wound is located on the Right,Medial T Great. The wound measures 0cm oe length x 0cm  width x 0cm depth; 0cm^2 area and 0cm^3 volume. There is no tunneling or undermining noted. There is a none present amount of drainage noted. The wound margin is distinct with the outline attached to the wound base. There is no granulation within the wound bed. There is no necrotic tissue within the wound bed. Wound #3 status is Open. Original cause of wound was Gradually Appeared. The wound is located on the Right,Lateral Calcaneus. The wound measures 0cm length x 0cm width x 0cm depth; 0cm^2 area and 0cm^3 volume. There is no tunneling or undermining noted. There is a none present amount of drainage noted. The wound margin is distinct with the outline attached to the wound base. There is no granulation within the wound bed. There is no necrotic tissue within the wound bed. 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 Dressing Change Frequency: Wound #1 Right,Lateral Foot: Change Dressing every other day. Wound Cleansing: Wound #1 Right,Lateral Foot: May shower with protection. - use a cast protector on all other days not changing the dressings. May shower and wash wound with soap and water. - with dressing changes. Primary Wound Dressing: Wound #1 Right,Lateral Foot: Calcium Alginate with Silver Secondary Dressing: Wound #1 Right,Lateral Foot: Foam Border - or gauze and kerlix Off-Loading: Open toe surgical shoe to: - right foot Other: - float right heel with pillows to offload pressure to heel while resting in chair and bed. Home Health: [facility] for wound care. Alvis Lemmings #1 continue with silver collagen to the remaining wound on  the right lateral foot this is the only remaining wound area 2. He did not hear about his arterial studies we will try to check into this. He certainly needs these going forward. The  improvement in his wounds is indeed a surprise Engineer, maintenance) Signed: 02/06/2020 5:47:00 PM By: Linton Ham MD Signed: 02/07/2020 4:42:40 PM By: Levan Hurst RN, BSN Entered By: Levan Hurst on 02/06/2020 16:43:02 -------------------------------------------------------------------------------- SuperBill Details Patient Name: Date of Service: Jonathan [name], Jonathan [name] 02/06/2020 Medical Record Number: [id number] Patient Account Number: [id number] Date of Birth/Sex: Treating RN: 05-21-1951 (69 y.o. 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 E11.51 Type 2 diabetes mellitus with diabetic peripheral angiopathy without gangrene Facility Procedures CPT4 Code: [id number] Description: 99214 - WOUND CARE VISIT-LEV 4 EST PT Modifier: Quantity: 1 Physician Procedures : CPT4 Code Description Modifier [id number] - WC PHYS LEVEL 3 - EST PT ICD-10 Diagnosis Description E11.621 Type 2 diabetes mellitus with foot ulcer E11.51 Type 2 diabetes mellitus with diabetic peripheral angiopathy without gangrene L97.512  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

## 2020-02-07 NOTE — Progress Notes (Signed)
DEMOND, SHALLENBERGER ([id number]) Visit Report for 02/06/2020 Arrival Information Details Patient Name: Date of Service: Jonathan [name], Jonathan [name] 02/06/2020 1:45 PM Medical Record Number: [id number] Patient Account Number: [id number] Date of Birth/Sex: Treating RN: March 10, 1951 (69 y.o. Jonette Eva, Briant Cedar Primary Care Helvi Royals: Harvin Hazel M Other Clinician: Referring Dareen Gutzwiller: Treating Uchechi Denison/Extender: Jarold Song, Lyman Bishop M Weeks in Treatment: 1 Visit Information History Since Last Visit Added or deleted any medications: No Patient Arrived: Wheel Chair Any new allergies or adverse reactions: No Arrival Time: 14:26 Had a fall or experienced change in No Accompanied By: wife activities of daily living that may affect Transfer Assistance: None risk of falls: Patient Identification Verified: Yes Signs or symptoms of abuse/neglect since last visito No Secondary Verification Process Completed: Yes Hospitalized since last visit: No Patient Has Alerts: Yes Implantable device outside of the clinic excluding No Patient Alerts: Patient on Blood Thinner cellular tissue based products placed in the center Right ABI: non compress since last visit: Has Dressing in Place as Prescribed: Yes Has Compression in Place as Prescribed: Yes Pain Present Now: No Electronic Signature(s) Signed: 02/07/2020 4:42:40 PM By: Levan Hurst RN, BSN Entered By: Levan Hurst on 02/06/2020 14:26:49 -------------------------------------------------------------------------------- Clinic Level of Care Assessment Details Patient Name: Date of Service: Jonathan [name], Jonathan [name] 02/06/2020 1:45 PM Medical Record Number: [id number] Patient Account Number: [id number] Date of Birth/Sex: Treating RN: 1951-04-16 (69 y.o. [name] Primary Care Sanav Remer: Harvin Hazel M Other Clinician: Referring Jamesmichael Shadd: Treating Ursula Dermody/Extender: Jarold Song, Lyman Bishop M Weeks in Treatment: 1 Clinic Level of Care Assessment Items TOOL  4 Quantity Score X- 1 0 Use when only an EandM is performed on FOLLOW-UP visit ASSESSMENTS - Nursing Assessment / Reassessment X- 1 10 Reassessment of Co-morbidities (includes updates in patient status) X- 1 5 Reassessment of Adherence to Treatment Plan ASSESSMENTS - Wound and Skin A ssessment / Reassessment []  - 0 Simple Wound Assessment / Reassessment - one wound X- 3 5 Complex Wound Assessment / Reassessment - multiple wounds []  - 0 Dermatologic / Skin Assessment (not related to wound area) ASSESSMENTS - Focused Assessment []  - 0 Circumferential Edema Measurements - multi extremities []  - 0 Nutritional Assessment / Counseling / Intervention X- 1 5 Lower Extremity Assessment (monofilament, tuning fork, pulses) []  - 0 Peripheral Arterial Disease Assessment (using hand held doppler) ASSESSMENTS - Ostomy and/or Continence Assessment and Care []  - 0 Incontinence Assessment and Management []  - 0 Ostomy Care Assessment and Management (repouching, etc.) PROCESS - Coordination of Care X - Simple Patient / Family Education for ongoing care 1 15 []  - 0 Complex (extensive) Patient / Family Education for ongoing care X- 1 10 Staff obtains Consents, Records, T Results / Process Orders est X- 1 10 Staff telephones HHA, Nursing Homes / Clarify orders / etc []  - 0 Routine Transfer to another Facility (non-emergent condition) []  - 0 Routine Hospital Admission (non-emergent condition) []  - 0 New Admissions / Biomedical engineer / Ordering NPWT Apligraf, etc. , []  - 0 Emergency Hospital Admission (emergent condition) X- 1 10 Simple Discharge Coordination []  - 0 Complex (extensive) Discharge Coordination PROCESS - Special Needs []  - 0 Pediatric / Minor Patient Management []  - 0 Isolation Patient Management []  - 0 Hearing / Language / Visual special needs []  - 0 Assessment of Community assistance (transportation, D/C planning, etc.) []  - 0 Additional assistance /  Altered mentation []  - 0 Support Surface(s) Assessment (bed, cushion, seat, etc.) INTERVENTIONS - Wound Cleansing / Measurement []  -  0 Simple Wound Cleansing - one wound X- 3 5 Complex Wound Cleansing - multiple wounds X- 1 5 Wound Imaging (photographs - any number of wounds) []  - 0 Wound Tracing (instead of photographs) []  - 0 Simple Wound Measurement - one wound X- 3 5 Complex Wound Measurement - multiple wounds INTERVENTIONS - Wound Dressings X - Small Wound Dressing one or multiple wounds 1 10 []  - 0 Medium Wound Dressing one or multiple wounds []  - 0 Large Wound Dressing one or multiple wounds X- 1 5 Application of Medications - topical []  - 0 Application of Medications - injection INTERVENTIONS - Miscellaneous []  - 0 External ear exam []  - 0 Specimen Collection (cultures, biopsies, blood, body fluids, etc.) []  - 0 Specimen(s) / Culture(s) sent or taken to Lab for analysis []  - 0 Patient Transfer (multiple staff / Civil Service fast streamer / Similar devices) []  - 0 Simple Staple / Suture removal (25 or less) []  - 0 Complex Staple / Suture removal (26 or more) []  - 0 Hypo / Hyperglycemic Management (close monitor of Blood Glucose) []  - 0 Ankle / Brachial Index (ABI) - do not check if billed separately X- 1 5 Vital Signs Has the patient been seen at the hospital within the last three years: Yes Total Score: 135 Level Of Care: New/Established - Level 4 Electronic Signature(s) Signed: 02/07/2020 4:42:40 PM By: Levan Hurst RN, BSN Entered By: Levan Hurst on 02/06/2020 15:56:00 -------------------------------------------------------------------------------- Lower Extremity Assessment Details Patient Name: Date of Service: Jonathan [name], Jonathan [name] 02/06/2020 1:45 PM Medical Record Number: [id number] Patient Account Number: [id number] Date of Birth/Sex: Treating RN: 1950/10/10 (69 y.o. [name] Primary Care Tykesha Konicki: Marykay Lex Other Clinician: Referring  Audwin Semper: Treating Ranie Chinchilla/Extender: Jarold Song, Lyman Bishop M Weeks in Treatment: 1 Edema Assessment Assessed: Shirlyn Goltz: No] Patrice Paradise: No] Edema: [Left: N] [Right: o] Calf Left: Right: Point of Measurement: 41 cm From Medial Instep cm 35.5 cm Ankle Left: Right: Point of Measurement: 18 cm From Medial Instep cm 19.5 cm Electronic Signature(s) Signed: 02/07/2020 4:42:40 PM By: Levan Hurst RN, BSN Entered By: Levan Hurst on 02/06/2020 14:27:30 -------------------------------------------------------------------------------- Multi Wound Chart Details Patient Name: Date of Service: Jonathan [name], Jonathan [name] 02/06/2020 1:45 PM Medical Record Number: [id number] Patient Account Number: [id number] Date of Birth/Sex: Treating RN: 1951/04/28 (69 y.o. [name] Primary Care Karmah Potocki: Harvin Hazel M Other Clinician: Referring Flower Franko: Treating Yameli Delamater/Extender: Jarold Song, Lyman Bishop M Weeks in Treatment: 1 Vital Signs Height(in): 71 Capillary Blood Glucose(mg/dl): 126 Weight(lbs): 145 Pulse(bpm): 33 Body Mass Index(BMI): 20 Blood Pressure(mmHg): 146/82 Temperature(F): 98.5 Respiratory Rate(breaths/min): 16 Photos: [1:No Photos Right, Lateral Foot] [2:Right, Medial T Great] [3:No Photos oe Right, Lateral Calcaneus] Wound Location: [1:Gradually Appeared] [2:Gradually Appeared] [3:Gradually Appeared] Wounding Event: [1:Diabetic Wound/Ulcer of the Lower] [2:Diabetic Wound/Ulcer of the Lower] [3:Diabetic Wound/Ulcer of the Lower] Primary Etiology: [1:Extremity Cataracts, Congestive Heart Failure,] [2:Extremity Cataracts, Congestive Heart Failure,] [3:Extremity Cataracts, Congestive Heart Failure,] Comorbid History: [1:Hypertension, Myocardial Infarction, Hepatitis C, Type II Diabetes, End Stage Renal Disease, Dementia, Neuropathy 12/26/2019] [2:Hypertension, Myocardial Infarction, Hepatitis C, Type II Diabetes, End Stage Renal Disease, Dementia,  Neuropathy 01/22/2020]  [3:Hypertension, Myocardial Infarction, Hepatitis C, Type II Diabetes, End Stage Renal Disease, Dementia, Neuropathy 12/24/2019] Date Acquired: [1:1] [2:1] [3:1] Weeks of Treatment: [1:Open] [2:Open] [3:Open] Wound Status: [1:0.3x0.2x0.1] [2:0x0x0] [3:0x0x0] Measurements L x W x D (cm) [1:0.047] [2:0] [3:0] A (cm) : rea [1:0.005] [2:0] [3:0] Volume (cm) : [1:70.10%] [2:100.00%] [3:100.00%] % Reduction in A rea: [1:92.10%] [2:100.00%] [3:100.00%] %  Reduction in Volume: [1:Grade 2] [2:Grade 1] [3:Grade 2] Classification: [1:Small] [2:None Present] [3:None Present] Exudate A mount: [1:Serous] [2:N/A] [3:N/A] Exudate Type: [1:amber] [2:N/A] [3:N/A] Exudate Color: [1:Well defined, not attached] [2:Distinct, outline attached] [3:Distinct, outline attached] Wound Margin: [1:Medium (34-66%)] [2:None Present (0%)] [3:None Present (0%)] Granulation A mount: [1:Pink, Pale] [2:N/A] [3:N/A] Granulation Quality: [1:Medium (34-66%)] [2:None Present (0%)] [3:None Present (0%)] Necrotic A mount: [1:Fat Layer (Subcutaneous Tissue)] [2:Fascia: No] [3:Fascia: No] Exposed Structures: [1:Exposed: Yes Fascia: No Tendon: No Muscle: No Joint: No Bone: No None] [2:Fat Layer (Subcutaneous Tissue) Exposed: No Tendon: No Muscle: No Joint: No Bone: No None] [3:Fat Layer (Subcutaneous Tissue) Exposed: No Tendon: No Muscle: No Joint: No Bone:  No None] Treatment Notes Electronic Signature(s) Signed: 02/06/2020 5:47:00 PM By: Linton Ham MD Signed: 02/07/2020 4:42:40 PM By: Levan Hurst RN, BSN Entered By: Linton Ham on 02/06/2020 15:51:18 -------------------------------------------------------------------------------- Multi-Disciplinary Care Plan Details Patient Name: Date of Service: LONZIE, SIMMER [name] 02/06/2020 1:45 PM Medical Record Number: [id number] Patient Account Number: [id number] Date of Birth/Sex: Treating RN: 1950-12-29 (69 y.o. [name] Primary Care Janet Humphreys: Harvin Hazel M Other  Clinician: Referring Lorrene Graef: Treating Cylan Borum/Extender: Jarold Song, Lyman Bishop M Weeks in Treatment: 1 Active Inactive Nutrition Nursing Diagnoses: Potential for alteratiion in Nutrition/Potential for imbalanced nutrition Goals: Patient/caregiver agrees to and verbalizes understanding of need to obtain nutritional consultation Date Initiated: 01/30/2020 Target Resolution Date: 02/14/2020 Goal Status: Active Interventions: Provide education on elevated blood sugars and impact on wound healing Provide education on nutrition Treatment Activities: Patient referred to Primary Care Physician for further nutritional evaluation : 01/30/2020 Notes: Orientation to the Wound Care Program Nursing Diagnoses: Knowledge deficit related to the wound healing center program Goals: Patient/caregiver will verbalize understanding of the [facility] Date Initiated: 01/30/2020 Target Resolution Date: 02/14/2020 Goal Status: Active Interventions: Provide education on orientation to the wound center Notes: Pain, Acute or Chronic Nursing Diagnoses: Pain, acute or chronic: actual or potential Potential alteration in comfort, pain Goals: Patient will verbalize adequate pain control and receive pain control interventions during procedures as needed Date Initiated: 01/30/2020 Target Resolution Date: 02/13/2020 Goal Status: Active Patient/caregiver will verbalize comfort level met Date Initiated: 01/30/2020 Target Resolution Date: 02/14/2020 Goal Status: Active Interventions: Encourage patient to take pain medications as prescribed Provide education on pain management Reposition patient for comfort Treatment Activities: Administer pain control measures as ordered : 01/30/2020 Notes: Wound/Skin Impairment Nursing Diagnoses: Knowledge deficit related to ulceration/compromised skin integrity Goals: Patient/caregiver will verbalize understanding of skin care regimen Date Initiated:  01/30/2020 Target Resolution Date: 02/14/2020 Goal Status: Active Ulcer/skin breakdown will have a volume reduction of 50% by week 8 Date Initiated: 01/30/2020 Target Resolution Date: 03/31/2020 Goal Status: Active Interventions: Assess patient/caregiver ability to obtain necessary supplies Assess patient/caregiver ability to perform ulcer/skin care regimen upon admission and as needed Provide education on ulcer and skin care Treatment Activities: Skin care regimen initiated : 01/30/2020 Topical wound management initiated : 01/30/2020 Notes: Electronic Signature(s) Signed: 02/07/2020 4:42:40 PM By: Levan Hurst RN, BSN Entered By: Levan Hurst on 02/06/2020 15:55:20 -------------------------------------------------------------------------------- Pain Assessment Details Patient Name: Date of Service: Jonathan [name], Jonathan [name] 02/06/2020 1:45 PM Medical Record Number: [id number] Patient Account Number: [id number] Date of Birth/Sex: Treating RN: [birth year] (69 y.o. [name] Primary Care Lilyanne Mcquown: Marykay Lex Other Clinician: Referring Alexandre Faries: Treating Amenah Tucci/Extender: Jarold Song, Lyman Bishop M Weeks in Treatment: 1 Active Problems Location of Pain Severity and Description of Pain Patient Has Paino No Site Locations  Pain Management and Medication Current Pain Management: Electronic Signature(s) Signed: 02/07/2020 4:42:40 PM By: Levan Hurst RN, BSN Entered By: Levan Hurst on 02/06/2020 14:27:24 -------------------------------------------------------------------------------- Patient/Caregiver Education Details Patient Name: Date of Service: Jonathan [name], Jonathan [name] 7/1/2021andnbsp1:45 PM Medical Record Number: [id number] Patient Account Number: [id number] Date of Birth/Gender: Treating RN: [birth year] (69 y.o. [name] Primary Care Physician: Harvin Hazel M Other Clinician: Referring Physician: Treating Physician/Extender: Jarold Song, Elder Negus Weeks in  Treatment: 1 Education Assessment Education Provided To: Patient Education Topics Provided Wound/Skin Impairment: Methods: Explain/Verbal Responses: State content correctly Electronic Signature(s) Signed: 02/07/2020 4:42:40 PM By: Levan Hurst RN, BSN Entered By: Levan Hurst on 02/06/2020 15:55:30 -------------------------------------------------------------------------------- Wound Assessment Details Patient Name: Date of Service: Jonathan [name], Jonathan [name] 02/06/2020 1:45 PM Medical Record Number: [id number] Patient Account Number: [id number] Date of Birth/Sex: Treating RN: Mar 01, 1951 (68 y.o. [name]) Carlene Coria Primary Care Damany Eastman: Harvin Hazel M Other Clinician: Referring Ranita Stjulien: Treating Lindalou Soltis/Extender: Jarold Song, Lyman Bishop M Weeks in Treatment: 1 Wound Status Wound Number: 1 Primary Diabetic Wound/Ulcer of the Lower Extremity Etiology: Wound Location: Right, Lateral Foot Wound Open Wounding Event: Gradually Appeared Status: Date Acquired: 12/26/2019 Comorbid Cataracts, Congestive Heart Failure, Hypertension, Myocardial Weeks Of Treatment: 1 History: Infarction, Hepatitis C, Type II Diabetes, End Stage Renal Disease, Clustered Wound: No Dementia, Neuropathy Photos Photo Uploaded By: Mikeal Hawthorne on 02/07/2020 11:54:37 Wound Measurements Length: (cm) 0.3 Width: (cm) 0.2 Depth: (cm) 0.1 Area: (cm) 0.047 Volume: (cm) 0.005 % Reduction in Area: 70.1% % Reduction in Volume: 92.1% Epithelialization: None Tunneling: No Undermining: No Wound Description Classification: Grade 2 Wound Margin: Well defined, not attached Exudate Amount: Small Exudate Type: Serous Exudate Color: amber Foul Odor After Cleansing: No Slough/Fibrino Yes Wound Bed Granulation Amount: Medium (34-66%) Exposed Structure Granulation Quality: Pink, Pale Fascia Exposed: No Necrotic Amount: Medium (34-66%) Fat Layer (Subcutaneous Tissue) Exposed: Yes Necrotic Quality: Adherent  Slough Tendon Exposed: No Muscle Exposed: No Joint Exposed: No Bone Exposed: No Electronic Signature(s) Signed: 02/07/2020 3:42:07 PM By: Carlene Coria RN Entered By: Carlene Coria on 02/06/2020 14:40:41 -------------------------------------------------------------------------------- Wound Assessment Details Patient Name: Date of Service: Jonathan [name], Jonathan [name] 02/06/2020 1:45 PM Medical Record Number: [id number] Patient Account Number: [id number] Date of Birth/Sex: Treating RN: 03/09/1951 (68 y.o. [name]) Carlene Coria Primary Care Emeric Novinger: Harvin Hazel M Other Clinician: Referring Kadeidra Coryell: Treating Emrys Mceachron/Extender: Jarold Song, Lyman Bishop M Weeks in Treatment: 1 Wound Status Wound Number: 2 Primary Diabetic Wound/Ulcer of the Lower Extremity Etiology: Wound Location: Right, Medial T Great oe Wound Open Wounding Event: Gradually Appeared Status: Date Acquired: 01/22/2020 Comorbid Cataracts, Congestive Heart Failure, Hypertension, Myocardial Weeks Of Treatment: 1 History: Infarction, Hepatitis C, Type II Diabetes, End Stage Renal Disease, Clustered Wound: No Dementia, Neuropathy Photos Photo Uploaded By: Mikeal Hawthorne on 02/07/2020 11:53:47 Wound Measurements Length: (cm) Width: (cm) Depth: (cm) Area: (cm) Volume: (cm) 0 % Reduction in Area: 100% 0 % Reduction in Volume: 100% 0 Epithelialization: None 0 Tunneling: No 0 Undermining: No Wound Description Classification: Grade 1 Wound Margin: Distinct, outline attached Exudate Amount: None Present Foul Odor After Cleansing: No Slough/Fibrino No Wound Bed Granulation Amount: None Present (0%) Exposed Structure Necrotic Amount: None Present (0%) Fascia Exposed: No Fat Layer (Subcutaneous Tissue) Exposed: No Tendon Exposed: No Muscle Exposed: No Joint Exposed: No Bone Exposed: No Electronic Signature(s) Signed: 02/07/2020 3:42:07 PM By: Carlene Coria RN Entered By: Carlene Coria on 02/06/2020  14:41:38 -------------------------------------------------------------------------------- Wound Assessment Details Patient Name: Date of Service: Jonathan [name], Jonathan [name] 02/06/2020  1:45 PM Medical Record Number: [id number] Patient Account Number: [id number] Date of Birth/Sex: Treating RN: 24-Jul-1951 (68 y.o. [name]) Carlene Coria Primary Care Andra Heslin: Harvin Hazel M Other Clinician: Referring Braelen Sproule: Treating Renwick Asman/Extender: Jarold Song, Lyman Bishop M Weeks in Treatment: 1 Wound Status Wound Number: 3 Primary Diabetic Wound/Ulcer of the Lower Extremity Etiology: Wound Location: Right, Lateral Calcaneus Wound Open Wounding Event: Gradually Appeared Status: Date Acquired: 12/24/2019 Comorbid Cataracts, Congestive Heart Failure, Hypertension, Myocardial Weeks Of Treatment: 1 History: Infarction, Hepatitis C, Type II Diabetes, End Stage Renal Disease, Clustered Wound: No Dementia, Neuropathy Photos Photo Uploaded By: Mikeal Hawthorne on 02/07/2020 11:54:52 Wound Measurements Length: (cm) Width: (cm) Depth: (cm) Area: (cm) Volume: (cm) 0 % Reduction in Area: 100% 0 % Reduction in Volume: 100% 0 Epithelialization: None 0 Tunneling: No 0 Undermining: No Wound Description Classification: Grade 2 Wound Margin: Distinct, outline attached Exudate Amount: None Present Foul Odor After Cleansing: No Slough/Fibrino No Wound Bed Granulation Amount: None Present (0%) Exposed Structure Necrotic Amount: None Present (0%) Fascia Exposed: No Fat Layer (Subcutaneous Tissue) Exposed: No Tendon Exposed: No Muscle Exposed: No Joint Exposed: No Bone Exposed: No Electronic Signature(s) Signed: 02/07/2020 3:42:07 PM By: Carlene Coria RN Entered By: Carlene Coria on 02/06/2020 14:41:14 -------------------------------------------------------------------------------- Vitals Details Patient Name: Date of Service: Jonathan [name], Jonathan [name] 02/06/2020 1:45 PM Medical Record Number: [id number] Patient Account  Number: [id number] Date of Birth/Sex: Treating RN: April 29, 1951 (69 y.o. [name] Primary Care Donisha Hoch: Harvin Hazel M Other Clinician: Referring Ricard Faulkner: Treating Danyiel Crespin/Extender: Jarold Song, Lyman Bishop M Weeks in Treatment: 1 Vital Signs Time Taken: 14:26 Temperature (F): 98.5 Height (in): 71 Pulse (bpm): 96 Weight (lbs): 145 Respiratory Rate (breaths/min): 16 Body Mass Index (BMI): 20.2 Blood Pressure (mmHg): 146/82 Capillary Blood Glucose (mg/dl): 126 Reference Range: 80 - 120 mg / dl Notes glucose per pt report Electronic Signature(s) Signed: 02/07/2020 4:42:40 PM By: Levan Hurst RN, BSN Entered By: Levan Hurst on 02/06/2020 14:27:17

## 2020-02-10 ENCOUNTER — Other Ambulatory Visit: Payer: Self-pay

## 2020-02-10 ENCOUNTER — Emergency Department (HOSPITAL_COMMUNITY)
Admission: EM | Admit: 2020-02-10 | Discharge: 2020-02-10 | Disposition: A | Discharge status: Home / Self Care | Payer: Medicare Other | Attending: Emergency Medicine | Admitting: Emergency Medicine

## 2020-02-10 DIAGNOSIS — I252 Old myocardial infarction: Secondary | ICD-10-CM | POA: Insufficient documentation

## 2020-02-10 DIAGNOSIS — Z794 Long term (current) use of insulin: Secondary | ICD-10-CM | POA: Diagnosis not present

## 2020-02-10 DIAGNOSIS — Y712 Prosthetic and other implants, materials and accessory cardiovascular devices associated with adverse incidents: Secondary | ICD-10-CM | POA: Insufficient documentation

## 2020-02-10 DIAGNOSIS — E1122 Type 2 diabetes mellitus with diabetic chronic kidney disease: Secondary | ICD-10-CM | POA: Diagnosis not present

## 2020-02-10 DIAGNOSIS — N186 End stage renal disease: Secondary | ICD-10-CM | POA: Insufficient documentation

## 2020-02-10 DIAGNOSIS — T82838A Hemorrhage of vascular prosthetic devices, implants and grafts, initial encounter: Secondary | ICD-10-CM | POA: Diagnosis present

## 2020-02-10 DIAGNOSIS — I5022 Chronic systolic (congestive) heart failure: Secondary | ICD-10-CM | POA: Insufficient documentation

## 2020-02-10 DIAGNOSIS — Z992 Dependence on renal dialysis: Secondary | ICD-10-CM | POA: Diagnosis not present

## 2020-02-10 DIAGNOSIS — I132 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 kidney disease, or end stage renal disease: Secondary | ICD-10-CM | POA: Diagnosis not present

## 2020-02-10 DIAGNOSIS — Z79899 Other long term (current) drug therapy: Secondary | ICD-10-CM | POA: Diagnosis not present

## 2020-02-10 DIAGNOSIS — Z7982 Long term (current) use of aspirin: Secondary | ICD-10-CM | POA: Insufficient documentation

## 2020-02-10 DIAGNOSIS — B182 Chronic viral hepatitis C: Secondary | ICD-10-CM | POA: Insufficient documentation

## 2020-02-10 NOTE — ED Notes (Signed)
Pt stuck by 3 different people, unable to obtain lab work. Wife no longer wanting pt to be stuck. MD informed and orders D/C'd

## 2020-02-10 NOTE — ED Triage Notes (Signed)
Pt coming from dialysis after shunt continuously bleeding at facility. Clamp placed by staff and bleeding currently controlled. Unknown EBL by EMS. Pt states he has never had a problem with it bleeding before. Not symptomatic

## 2020-02-10 NOTE — ED Notes (Signed)
Patient verbalizes understanding of discharge instructions. Opportunity for questioning and answers were provided. Armband removed by staff, pt discharged from ED by wheelchair with wife to take home

## 2020-02-10 NOTE — ED Provider Notes (Signed)
[facility] EMERGENCY DEPARTMENT Provider Note   CSN: [id number] Arrival date & time: 02/10/20  1520     History Bleeding from dialysis access  Jonathan Barrack Sr. is a 69 y.o. male.  HPI   Patient presents to the emergency room for evaluation of bleeding from his dialysis shunt.  The patient was at dialysis today.  He was doing well without any particular complaints.  Patient states when they removed the dialysis needles he had persistent bleeding that they were not able to control.  Staff at the facility applied a pressure clamp.  He was transported to the ED for further evaluation.  The bleeding was controlled after they applied the pressure clamps.  Past Medical History:  Diagnosis Date  . Diabetes mellitus without complication ([place])   . FUO (fever of unknown origin) 09/17/2019  . Hypertension   . Renal disorder 2015   right kidney transplant  . Stroke [facility])     Patient Active Problem List   Diagnosis Date Noted  . FUO (fever of unknown origin) 09/17/2019  . Cerebral thrombosis with cerebral infarction 08/02/2019  . Cerebral embolism with cerebral infarction 08/02/2019  . Acute respiratory failure due to COVID-19 ([place]) 07/30/2019  . Acute respiratory failure with hypoxia ([place]) 07/30/2019  . Chronic systolic HF (heart failure) ([place]) 02/25/2019  . ESRD (end stage renal disease) on dialysis ([place]) 02/25/2019  . Generalized abdominal pain 07/16/2018  . Arteriovenous fistula for hemodialysis in place, primary ([place]) 05/15/2018  . 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 Acute blood loss anemia 12/14/2017  . Hypomagnesemia 12/03/2017  . Anticoagulated 12/02/2017  . Stroke ([place]) 11/29/2017  . 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 History of MI (myocardial infarction) 11/28/2017  . Hypertension 11/28/2017  . Chronic hepatitis C without hepatic coma ([place]) 09/25/2017  . Diabetes mellitus type 2, uncomplicated ([place]) 01/60/1093  .  Myocardial infarction ([place]) 09/25/2017  . Renal transplant recipient 09/25/2017  . Transfusion history 09/25/2017  . History of coronary artery disease 09/12/2016  . 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 Aftercare following organ transplant 09/11/2016  . 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 At risk for infection transmitted from donor 06/16/2014  . Ureteral stent displacement ([place]) 06/10/2014  . Prophylactic antibiotic 06/09/2014  . Immunosuppression ([place]) 06/06/2014    Past Surgical History:  Procedure Laterality Date  . BACK SURGERY     Has had 2 back surgeries  . BUBBLE STUDY  09/18/2019   Procedure: BUBBLE STUDY;  Surgeon: Buford Dresser, MD;  Location: [facility] ENDOSCOPY;  Service: Cardiovascular;;  . Depression    . HAND SURGERY Right   . HAND SURGERY    . IR REMOVAL TUN CV CATH W/O FL  09/03/2019  . KIDNEY TRANSPLANT     November 2015  . Memory loss    . NEPHRECTOMY TRANSPLANTED ORGAN    . TEE WITHOUT CARDIOVERSION N/A 09/18/2019   Procedure: TRANSESOPHAGEAL ECHOCARDIOGRAM (TEE);  Surgeon: Buford Dresser, MD;  Location: [facility] ENDOSCOPY;  Service: Cardiovascular;  Laterality: N/A;       Family History  Problem Relation Age of Onset  . Diabetes Mellitus II Mother   . Diabetes Mellitus II Brother     Social History   Tobacco Use  . Smoking status: Former Research scientist (life sciences)  . Smokeless tobacco: Never Used  Substance Use Topics  . Alcohol use: Never  . Drug use: Never    Home Medications Prior to Admission  medications   Medication Sig Start Date End Date Taking? Authorizing Provider  allopurinol (ZYLOPRIM) 100 MG tablet Take 100 mg by mouth daily.  06/09/14  Yes [provider]  aspirin 81 MG chewable tablet Chew 81 mg by mouth daily.  06/09/14  Yes [provider]  atorvastatin (LIPITOR) 40 MG tablet Take 40 mg by mouth daily.   Yes [provider]  insulin degludec (TRESIBA  FLEXTOUCH) 100 UNIT/ML SOPN FlexTouch Pen Inject 0.06 mLs (6 Units total) into the skin daily. Patient taking differently: Inject 6 Units into the skin as needed.  09/21/19  Yes Sheikh, Omair Latif, DO  Insulin Pen Needle (BD PEN NEEDLE NANO U/F) 32G X 4 MM MISC 1 each by Other route as needed (insulin).  09/10/16  Yes [provider]  levalbuterol (XOPENEX HFA) 45 MCG/ACT inhaler Inhale 2 puffs into the lungs every 8 (eight) hours as needed for wheezing. 09/21/19  Yes Sheikh, Omair Latif, DO  lidocaine-prilocaine (EMLA) cream Apply 1 application topically 3 (three) times a week. 30 minutes prior to Dialysis - MWF 05/08/18  Yes [provider]  metoCLOPramide (REGLAN) 5 MG tablet Take 5 mg by mouth 3 (three) times daily with meals.   Yes [provider]  NOVOLOG FLEXPEN 100 UNIT/ML FlexPen Inject 0-10 Units into the skin as needed for high blood sugar. Per sliding scale 05/29/17  Yes [provider]  ondansetron (ZOFRAN) 4 MG tablet Take 1 tablet (4 mg total) by mouth every 6 (six) hours as needed for nausea. 09/21/19  Yes Sheikh, Omair Latif, DO  predniSONE (DELTASONE) 5 MG tablet Take 5 mg by mouth daily with breakfast.  03/17/17  Yes [provider]  senna (SENOKOT) 8.6 MG tablet Take 1 tablet by mouth as needed for constipation.   Yes [provider]  warfarin (COUMADIN) 4 MG tablet Take 1 tablet (4 mg total) by mouth daily at 6 PM. Patient taking differently: Take 4 mg by mouth See admin instructions. 6mg  on MWF, Tues, Thurs, Sat and Sun - 9mg . 09/21/19  Yes Sheikh, Omair Latif, DO  acetaminophen (TYLENOL) 325 MG tablet Take 2 tablets (650 mg total) by mouth every 6 (six) hours as needed for mild pain (or Fever >/= 101). Patient not taking: Reported on 02/10/2020 09/21/19   Raiford Noble Latif, DO  Amino Acids-Protein Hydrolys (FEEDING SUPPLEMENT, PRO-STAT SUGAR FREE 64,) LIQD Take 30 mLs by mouth 2 (two) times daily. Patient not taking: Reported on  02/10/2020 09/21/19   Raiford Noble Latif, DO  B Complex-C-Folic Acid (RENA-VITE RX) 1 MG TABS Take 1 tablet by mouth daily. Patient not taking: Reported on 02/10/2020 05/21/19   [provider]  calcium acetate (PHOSLO) 667 MG capsule Take 1,334 mg by mouth 3 (three) times daily with meals.  Patient not taking: Reported on 02/10/2020    [provider]  glucagon, human recombinant, (GLUCAGEN) 1 MG injection Inject 1 mg into the muscle once as needed for low blood sugar. Patient not taking: Reported on 02/10/2020 09/21/19 09/15/20  Raiford Noble Latif, DO  losartan (COZAAR) 25 MG tablet Take 25 mg by mouth daily.  Patient not taking: Reported on 02/10/2020 03/29/19   [provider]  metoprolol succinate (TOPROL-XL) 50 MG 24 hr tablet Take 50 mg by mouth daily.  Patient not taking: Reported on 02/10/2020 06/09/14   [provider]  Nutritional Supplements (FEEDING SUPPLEMENT, NEPRO CARB STEADY,) LIQD Take 237 mLs by mouth 3 (three) times daily between meals. Patient not taking:  Reported on 02/10/2020 09/21/19   Raiford Noble Latif, DO  pantoprazole (PROTONIX) 40 MG tablet Take 1 tablet (40 mg total) by mouth daily. Patient not taking: Reported on 02/10/2020 09/22/19   Kerney Elbe, DO    Allergies    Patient has no known allergies.  Review of Systems   Review of Systems  All other systems reviewed and are negative.   Physical Exam Updated Vital Signs BP (!) 146/92   Pulse 91   Temp 97.7 F (36.5 C) (Oral)   Resp 16   Ht 1.829 m (6')   Wt 67.6 kg   SpO2 100%   BMI 20.21 kg/m   Physical Exam Vitals and nursing note reviewed.  Constitutional:      General: He is not in acute distress.    Appearance: He is well-developed.  HENT:     Head: Normocephalic and atraumatic.     Right Ear: External ear normal.     Left Ear: External ear normal.  Eyes:     General: No scleral icterus.       Right eye: No discharge.        Left eye: No discharge.      Conjunctiva/sclera: Conjunctivae normal.  Neck:     Trachea: No tracheal deviation.  Cardiovascular:     Rate and Rhythm: Normal rate and regular rhythm.  Pulmonary:     Effort: Pulmonary effort is normal. No respiratory distress.     Breath sounds: Normal breath sounds. No stridor. No wheezing or rales.  Abdominal:     General: Bowel sounds are normal. There is no distension.     Palpations: Abdomen is soft.     Tenderness: There is no abdominal tenderness. There is no guarding or rebound.  Musculoskeletal:        General: No tenderness.     Cervical back: Neck supple.     Comments: Palpable thrill right antecubital fossa at his dialysis shunt, patient has a dressing that is dry with 2 pressure clamps.  Skin:    General: Skin is warm and dry.     Findings: No rash.  Neurological:     Mental Status: He is alert.     Cranial Nerves: No cranial nerve deficit (no facial droop, extraocular movements intact, no slurred speech).     Sensory: No sensory deficit.     Motor: No abnormal muscle tone or seizure activity.     Coordination: Coordination normal.     ED Results / Procedures / Treatments   Labs (all labs ordered are listed, but only abnormal results are displayed) Labs Reviewed  CBC  BASIC METABOLIC PANEL  PROTIME-INR    EKG None  Radiology No results found.  Procedures Procedures (including critical care time)  Medications Ordered in ED Medications - No data to display  ED Course  I have reviewed the triage vital signs and the nursing notes.  Pertinent labs & imaging results that were available during my care of the patient were reviewed by me and considered in my medical decision making (see chart for details).  The pressure clamps were removed from the overlying dressings.  Patient was observed and no further bleeding was noted.  We will continue to allow the dressings to remain in place at this time.  Good palpable thrill and pulse noted on exam. Clinical  Course as of Feb 09 1900  Mon Feb 10, 2020  1607 Distal wound started bleeding again.  Thrombipad and pressure dressing applied.   [  QA]  8341 Rechecked wound.  No bleeding at this time.    [JK]  1850 No recurrent bleeding.  Palpable thrill and pulse distal to bandage   [JK]    Clinical Course User Index [JK] Dorie Rank, MD   MDM Rules/Calculators/A&P                          Patient presented the ED for evaluation of bleeding from his puncture wound sites associated with his dialysis treatment.  Patient initially was not bleeding when he arrived.  I removed the pressure clamp.  Patient started bleeding again.  Thrombin pad and a tight dressing were reapplied.  Patient continued to have strong distal pulses and thrill.  Patient was monitored for several hours.  No recurrent bleeding was noted.  We did attempt to draw blood to check his INR but staff were unsuccessful.  Patient family refused further blood draws.  At this point I think that is reasonable considering he has not had any recurrent bleeding.  Patient has had his INR checked recently by family and states it was in the normal range.  Patient will remove the dressing tomorrow.  Follow-up with dialysis as planned. Final Clinical Impression(s) / ED Diagnoses Final diagnoses:  Bleeding from dialysis shunt, initial encounter [facility])    Rx / DC Orders ED Discharge Orders    None       Dorie Rank, MD 02/10/20 [phone]

## 2020-02-10 NOTE — Discharge Instructions (Addendum)
Take the dressing off tomorrow.  Return as needed for recurrent symptoms

## 2020-02-10 NOTE — ED Notes (Signed)
Pt wife called [place] in after seeing his right upper arm fistulas started bleeding. Held pressure to site and MD to bedside. Quick clot applied and pressure dressing applied.  The distal site of fistula was the one bleeding. BP 146/92.   There was bleeding under patient.  Radial pulse palpable and thrill palpable to fistula.

## 2020-02-18 ENCOUNTER — Encounter (HOSPITAL_BASED_OUTPATIENT_CLINIC_OR_DEPARTMENT_OTHER): Payer: Medicare Other | Admitting: Internal Medicine

## 2020-02-18 DIAGNOSIS — E11621 Type 2 diabetes mellitus with foot ulcer: Secondary | ICD-10-CM | POA: Diagnosis not present

## 2020-02-18 NOTE — Progress Notes (Addendum)
Jonathan [name], Jonathan [name] ([id number]) Visit Report for 02/18/2020 Debridement Details Patient Name: Date of Service: Jonathan [name], Jonathan [name] 02/18/2020 1:45 PM Medical Record Number: [id number] Patient Account Number: [id number] Date of Birth/Sex: Treating [name]: 1950/08/12 (68 y.o. Jonathan [name]) Jonathan [name] Primary Care Provider: Harvin Hazel [name] Other Clinician: Referring Provider: Treating Provider/Extender: Jarold Song, Lyman Bishop [name] Weeks in Treatment: 2 Debridement Performed for Assessment: Wound #1 Right,Lateral Foot Performed By: Physician Jonathan Dillon., Jonathan [name] Debridement Type: Debridement Severity of Tissue Pre Debridement: Fat layer exposed Level of Consciousness (Pre-procedure): Awake and Alert Pre-procedure Verification/Time Out Yes - 15:25 Taken: Start Time: 15:25 Pain Control: Lidocaine 5% topical ointment T Area Debrided (L x W): otal 0.3 (cm) x 0.2 (cm) = 0.06 (cm) Tissue and other material debrided: Viable, Non-Viable, Slough, Skin: Dermis , Skin: Epidermis, Slough Level: Skin/Epidermis Debridement Description: Selective/Open Wound Instrument: Curette Bleeding: Moderate Hemostasis Achieved: Pressure End Time: 15:28 Procedural Pain: 1 Post Procedural Pain: 0 Response to Treatment: Procedure was tolerated well Level of Consciousness (Post- Awake and Alert procedure): Post Debridement Measurements of Total Wound Length: (cm) 0.3 Width: (cm) 0.2 Depth: (cm) 0.1 Volume: (cm) 0.005 Character of Wound/Ulcer Post Debridement: Improved Severity of Tissue Post Debridement: Fat layer exposed Post Procedure Diagnosis Same as Pre-procedure Electronic Signature(s) Signed: 02/18/2020 5:16:39 PM By: Linton Ham Jonathan [name] Signed: 02/18/2020 5:57:23 PM By: Jonathan [name] [name] Entered By: Linton Ham on 02/18/2020 15:34:15 -------------------------------------------------------------------------------- HPI Details Patient Name: Date of Service: Jonathan [name], Jonathan [name] 02/18/2020 1:45 PM Medical Record Number:  [id number] Patient Account Number: [id number] Date of Birth/Sex: Treating [name]: [birth year] (68 y.o. Jonathan [name] Primary Care Provider: Harvin Hazel [name] Other Clinician: Referring Provider: Treating Provider/Extender: Jarold Song, Lyman Bishop [name] Weeks in Treatment: 2 History of Present Illness HPI Description: ADMISSION 01/30/2020 This is a 69 year old man who was hospitalized with Covid for a month in January 2021. Subsequently he was discharged to [facility] skilled facility for rehabilitation. His wife states that when he got home he had areas on the right lateral heel, right lateral fifth metatarsal head. They have been using Santyl to the right lateral heel no dressing on the right lateral foot. Our intake nurse discovered in small area on the right lateral great toe. He is minimally ambulatory at this point. He does not have a known history of PAD although he is a diabetic. 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 Still a small open area on the medial ffirth metatarsal head. But this is also better. He has not heard anything about his arterial studies that we ordered 7/13; patient's review with vein and vascular/noninvasive testing is not till 7/3. Still an open area on the right lateral first met head. The area on the right lateral heel remains closed although it is callused Electronic Signature(s) Signed: 02/18/2020 5:16:39 PM By: Linton Ham Jonathan [name] Entered By: Linton Ham on 02/18/2020  15:30:56 -------------------------------------------------------------------------------- Physical Exam Details Patient Name: Date of Service: Jonathan [name], Jonathan [name] 02/18/2020 1:45 PM Medical Record Number: [id number] Patient Account Number: [id number] Date of Birth/Sex: Treating [name]: 01/04/1951 (69 y.o.  Jonathan [name] Primary Care Provider: Harvin Hazel [name] Other Clinician: Referring Provider: Treating Provider/Extender: Jarold Song, Lyman Bishop [name] Weeks in Treatment: 2 Constitutional Sitting or standing Blood Pressure is within target range for patient.. Pulse regular and within target range for patient.Marland Kitchen Respirations regular, non-labored and within target range.. Temperature is normal and within the target range for the patient.Marland Kitchen Appears in no distress. Notes Wound exam; the the right lateral calcaneus remains closed. On the right lateral fifth metatarsal head there is a small wound. This is not closed small and punched out. I used a #3 curette to remove thick skin and callus from around the wound edge. This is still open. Does not probe to bone Electronic Signature(s) Signed: 02/18/2020 5:16:39 PM By: Linton Ham Jonathan [name] Entered By: Linton Ham on 02/18/2020 15:32:00 -------------------------------------------------------------------------------- Physician Orders Details Patient Name: Date of Service: Jonathan [name], Jonathan [name] 02/18/2020 1:45 PM Medical Record Number: [id number] Patient Account Number: [id number] Date of Birth/Sex: Treating [name]: 03/10/1951 (68 y.o. Jonathan [name] Primary Care Provider: Harvin Hazel [name] Other Clinician: Referring Provider: Treating Provider/Extender: Jarold Song, Lyman Bishop [name] Weeks in Treatment: 2 Verbal / Phone Orders: No Diagnosis Coding ICD-10 Coding Code Description E11.621 Type 2 diabetes mellitus with foot ulcer L97.512 Non-pressure chronic ulcer of other part of right foot with fat layer exposed L97.411 Non-pressure chronic ulcer of right heel  and midfoot limited to breakdown of skin E11.51 Type 2 diabetes mellitus with diabetic peripheral angiopathy without gangrene Follow-up Appointments Return Appointment in 2 weeks. Dressing Change Frequency Wound #1 Right,Lateral Foot Change Dressing every other day. Wound Cleansing Wound #1 Right,Lateral Foot May shower with protection. - use a cast protector on all other days not changing the dressings. May shower and wash wound with soap and water. - with dressing changes. Primary Wound Dressing Wound #1 Right,Lateral Foot Calcium Alginate with Silver Secondary Dressing Wound #1 Right,Lateral Foot Foam Border - or gauze and kerlix Off-Loading Open toe surgical shoe to: - right foot Other: - float right heel with pillows to offload pressure to heel while resting in chair and bed. [place] skilled nursing for wound care. Alvis Lemmings Electronic Signature(s) Signed: 02/18/2020 5:16:39 PM By: Linton Ham Jonathan [name] Signed: 02/18/2020 5:57:23 PM By: Jonathan [name] [name] Entered By: Jonathan [name] on 02/18/2020 15:28:39 -------------------------------------------------------------------------------- Problem List Details Patient Name: Date of Service: Jonathan [name], Jonathan [name] 02/18/2020 1:45 PM Medical Record Number: [id number] Patient Account Number: [id number] Date of Birth/Sex: Treating [name]: [birth year] (68 y.o. Jonathan [name] Primary Care Provider: Harvin Hazel [name] Other Clinician: Referring Provider: Treating Provider/Extender: Jarold Song, Lyman Bishop [name] Weeks in Treatment: 2 Active Problems ICD-10 Encounter Code Description Active Date MDM Diagnosis E11.621 Type 2 diabetes mellitus with foot ulcer 01/30/2020 No Yes L97.512 Non-pressure chronic ulcer of other part of right foot with fat layer exposed 01/30/2020 No Yes L97.411 Non-pressure chronic ulcer of right heel and midfoot limited to breakdown of 01/30/2020 No Yes skin E11.51 Type 2 diabetes mellitus with diabetic  peripheral angiopathy without gangrene 01/30/2020 No Yes Inactive Problems Resolved Problems Electronic Signature(s) Signed: 02/18/2020 5:16:39 PM By: Linton Ham Jonathan [name] Entered By: Linton Ham on 02/18/2020 15:30:06 -------------------------------------------------------------------------------- Progress Note Details Patient Name: Date of Service: Jonathan [name], Jonathan [name] 02/18/2020 1:45 PM Medical Record Number: [id number] Patient Account Number: [id number] Date of Birth/Sex: Treating [name]: 1950-11-06 (68 y.o. Jonathan [name] Primary Care Provider: Harvin Hazel [name] Other Clinician: Referring Provider: Treating Provider/Extender: Jarold Song, Elder Negus  Weeks in Treatment: 2 Subjective History of Present Illness (HPI) ADMISSION 01/30/2020 This is a 69 year old man who was hospitalized with Covid for a month in January 2021. Subsequently he was discharged to [facility] skilled facility for rehabilitation. His wife states that when he got home he had areas on the right lateral heel, right lateral fifth metatarsal head. They have been using Santyl to the right lateral heel no dressing on the right lateral foot. Our intake nurse discovered in small area on the right lateral great toe. He is minimally ambulatory at this point. He does not have a known history of PAD although he is a diabetic. 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 Still a small open area on the medial ffirth metatarsal head. But this is also  better. He has not heard anything about his arterial studies that we ordered 7/13; patient's review with vein and vascular/noninvasive testing is not till 7/3. Still an open area on the right lateral first met head. The area on the right lateral heel remains closed although it is callused Objective Constitutional Sitting or standing Blood Pressure is within target range for patient.. Pulse regular and within target range for patient.Marland Kitchen Respirations regular, non-labored and within target range.. Temperature is normal and within the target range for the patient.Marland Kitchen Appears in no distress. Vitals Time Taken: 2:30 PM, Height: 71 in, Weight: 145 lbs, BMI: 20.2, Temperature: 98.4 F, Pulse: 95 bpm, Respiratory Rate: 16 breaths/min, Blood Pressure: 113/69 mmHg. General Notes: Wound exam; the the right lateral calcaneus remains closed. ooOn the right lateral fifth metatarsal head there is a small wound. This is not closed small and punched out. I used a #3 curette to remove thick skin and callus from around the wound edge. This is still open. Does not probe to bone Integumentary (Hair, Skin) Wound #1 status is Open. Original cause of wound was Gradually Appeared. The wound is located on the Right,Lateral Foot. The wound measures 0.3cm length x 0.2cm width x 0.1cm depth; 0.047cm^2 area and 0.005cm^3 volume. There is Fat Layer (Subcutaneous Tissue) Exposed exposed. There is no tunneling or undermining noted. There is a small amount of serosanguineous drainage noted. The wound margin is well defined and not attached to the wound base. There is medium (34-66%) pink, pale granulation within the wound bed. There is a medium (34-66%) amount of necrotic tissue within the wound bed including Adherent Slough. 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 There was a Selective/Open Wound Skin/Epidermis Debridement with a total area of 0.06 sq cm performed by Jonathan Dillon., Jonathan [name]. With the following instrument(s): Curette to remove Viable and Non-Viable tissue/material. Material removed includes  Slough, Skin: Dermis, and Skin: Epidermis after achieving pain control using Lidocaine 5% topical ointment. No specimens were taken. A time out was conducted at 15:25, prior to the start of the procedure. A Moderate amount of bleeding was controlled with Pressure. The procedure was tolerated well with a pain level of 1 throughout and a pain level of 0 following the procedure. Post Debridement Measurements: 0.3cm length x 0.2cm width x 0.1cm depth; 0.005cm^3 volume. Character of Wound/Ulcer Post Debridement is improved. Severity of Tissue Post Debridement is: Fat layer exposed. Post procedure Diagnosis Wound #1: Same as Pre-Procedure Plan Follow-up Appointments: Return Appointment in 2 weeks. Dressing Change Frequency: Wound #1 Right,Lateral Foot: Change Dressing every other day. Wound Cleansing: Wound #1 Right,Lateral Foot: May shower with protection. - use a cast protector on all other days not changing the dressings. May shower and wash wound with soap and water. - with dressing changes. Primary Wound Dressing: Wound #1 Right,Lateral Foot: Calcium Alginate with Silver Secondary Dressing: Wound #1 Right,Lateral Foot: Foam Border - or gauze and kerlix Off-Loading: Open toe surgical shoe to: - right foot Other: - float right heel with pillows to offload pressure to heel while resting in chair and bed. Home Health: [place] skilled  nursing for wound care. Alvis Lemmings #1 I am going to continue with silver alginate but may consider changing to silver collagen next time #2. Debrided of thick callus of the skin around the margins of the wound to get a clean wound edge. Electronic Signature(s) Signed: 02/24/2020 8:03:52 AM By: Linton Ham Jonathan [name] Signed: 02/24/2020 4:12:59 PM By: Jonathan [name], Jonathan [name] Previous Signature: 02/18/2020 5:16:39 PM Version By: Linton Ham Jonathan [name] Entered By: Jonathan Hurst on 02/21/2020 11:58:36 -------------------------------------------------------------------------------- [place] Details Patient Name: Date of Service: TOLBERT, MATHESON 02/18/2020 Medical Record Number: [id number] Patient Account Number: [id number] Date of Birth/Sex: Treating [name]: 1951-06-04 (68 y.o. Jonathan [name], Jonathan [name] Primary Care Provider: Harvin Hazel [name] Other Clinician: Referring Provider: Treating Provider/Extender: Jarold Song, Lyman Bishop [name] Weeks in Treatment: 2 Diagnosis Coding ICD-10 Codes Code Description [phone] Type 2 diabetes mellitus with foot ulcer L97.512 Non-pressure chronic ulcer of other part of right foot with fat layer exposed L97.411 Non-pressure chronic ulcer of right heel and midfoot limited to breakdown of skin E11.51 Type 2 diabetes mellitus with diabetic peripheral angiopathy without gangrene Facility Procedures CPT4 Code: [id number] Description: [phone] - DEBRIDE WOUND 1ST 20 SQ CM OR < ICD-10 Diagnosis Description L97.512 Non-pressure chronic ulcer of other part of right foot with fat layer exposed E11.621 Type 2 diabetes mellitus with foot ulcer Modifier: Quantity: 1 Physician Procedures : CPT4 Code Description Modifier [id number] - WC PHYS DEBR WO ANESTH 20 SQ CM ICD-10 Diagnosis Description L97.512 Non-pressure chronic ulcer of other part of right foot with fat layer exposed E11.621 Type 2 diabetes mellitus with foot ulcer Quantity: 1 Electronic Signature(s) Signed: 02/18/2020 5:16:39 PM By:  Linton Ham Jonathan [name] Entered By: Linton Ham on 02/18/2020 15:34:35

## 2020-02-24 NOTE — Progress Notes (Signed)
MCDONALD, REILING ([id number]) Visit Report for 02/18/2020 Arrival Information Details Patient Name: Date of Service: Jonathan [name], Jonathan [name] 02/18/2020 1:45 PM Medical Record Number: [id number] Patient Account Number: [id number] Date of Birth/Sex: Treating RN: [birth year] (69 y.o. [name] Primary Care Alvenia Treese: Harvin Hazel M Other Clinician: Referring Saray Capasso: Treating Anmol Fleck/Extender: Jarold Song, Elder Negus Weeks in Treatment: 2 Visit Information History Since Last Visit Added or deleted any medications: No Patient Arrived: Wheel Chair Any new allergies or adverse reactions: No Arrival Time: 14:29 Had a fall or experienced change in No Accompanied By: wife activities of daily living that may affect Transfer Assistance: None risk of falls: Patient Identification Verified: Yes Signs or symptoms of abuse/neglect since last visito No Secondary Verification Process Completed: Yes Hospitalized since last visit: No Patient Has Alerts: Yes Implantable device outside of the clinic excluding No Patient Alerts: Patient on Blood Thinner cellular tissue based products placed in the center Right ABI: non compress since last visit: Has Dressing in Place as Prescribed: Yes Pain Present Now: No Electronic Signature(s) Signed: 02/24/2020 4:12:59 PM By: Levan Hurst RN, BSN Entered By: Levan Hurst on 02/18/2020 14:30:11 -------------------------------------------------------------------------------- Encounter Discharge Information Details Patient Name: Date of Service: Jonathan [name], Jonathan [name] 02/18/2020 1:45 PM Medical Record Number: [id number] Patient Account Number: [id number] Date of Birth/Sex: Treating RN: [birth year] (69 y.o. [name] Primary Care Lindel Marcell: Harvin Hazel M Other Clinician: Referring Naksh Radi: Treating Jamaar Howes/Extender: Jarold Song, Lyman Bishop M Weeks in Treatment: 2 Encounter Discharge Information Items Post Procedure Vitals Discharge Condition:  Stable Temperature (F): 98.4 Ambulatory Status: Wheelchair Pulse (bpm): 95 Discharge Destination: Home Respiratory Rate (breaths/min): 16 Transportation: Private Auto Blood Pressure (mmHg): 113/69 Accompanied By: family member Schedule Follow-up Appointment: Yes Clinical Summary of Care: Patient Declined Electronic Signature(s) Signed: 02/18/2020 5:18:51 PM By: Kela Millin Entered By: Kela Millin on 02/18/2020 17:01:41 -------------------------------------------------------------------------------- Lower Extremity Assessment Details Patient Name: Date of Service: Jonathan [name], Jonathan [name] 02/18/2020 1:45 PM Medical Record Number: [id number] Patient Account Number: [id number] Date of Birth/Sex: Treating RN: July 21, 1951 (69 y.o. [name] Primary Care Romone Shaff: Harvin Hazel M Other Clinician: Referring Avneet Ashmore: Treating Ryelan Kazee/Extender: Jarold Song, Lyman Bishop M Weeks in Treatment: 2 Edema Assessment Assessed: Shirlyn Goltz: No] Patrice Paradise: No] Edema: [Left: N] [Right: o] Calf Left: Right: Point of Measurement: 41 cm From Medial Instep cm 35.5 cm Ankle Left: Right: Point of Measurement: 18 cm From Medial Instep cm 19.5 cm Vascular Assessment Pulses: Dorsalis Pedis Palpable: [Right:Yes] Electronic Signature(s) Signed: 02/24/2020 4:12:59 PM By: Levan Hurst RN, BSN Entered By: Levan Hurst on 02/18/2020 14:30:37 -------------------------------------------------------------------------------- Multi Wound Chart Details Patient Name: Date of Service: Jonathan [name], Jonathan [name] 02/18/2020 1:45 PM Medical Record Number: [id number] Patient Account Number: [id number] Date of Birth/Sex: Treating RN: 12-26-1950 (68 y.o. [name] Primary Care Melvern Ramone: Harvin Hazel M Other Clinician: Referring Bonnie Roig: Treating Brach Birdsall/Extender: Jarold Song, Lyman Bishop M Weeks in Treatment: 2 Vital Signs Height(in): 71 Pulse(bpm): 95 Weight(lbs): 145 Blood Pressure(mmHg):  113/69 Body Mass Index(BMI): 20 Temperature(F): 98.4 Respiratory Rate(breaths/min): 16 Photos: [1:No Photos Right, Lateral Foot] [N/A:N/A N/A] Wound Location: [1:Gradually Appeared] [N/A:N/A] Wounding Event: [1:Diabetic Wound/Ulcer of the Lower] [N/A:N/A] Primary Etiology: [1:Extremity Cataracts, Congestive Heart Failure,] [N/A:N/A] Comorbid History: [1:Hypertension, Myocardial Infarction, Hepatitis C, Type II Diabetes, End Stage Renal Disease, Dementia, Neuropathy 12/26/2019] [N/A:N/A] Date Acquired: [1:2] [N/A:N/A] Weeks of Treatment: [1:Open] [N/A:N/A] Wound Status: [1:0.3x0.2x0.1] [N/A:N/A] Measurements L x W x D (cm) [1:0.047] [N/A:N/A] A (cm) : rea [1:0.005] [N/A:N/A] Volume (cm) : [  1:70.10%] [N/A:N/A] % Reduction in A rea: [1:92.10%] [N/A:N/A] % Reduction in Volume: [1:Grade 2] [N/A:N/A] Classification: [1:Small] [N/A:N/A] Exudate A mount: [1:Serosanguineous] [N/A:N/A] Exudate Type: [1:red, brown] [N/A:N/A] Exudate Color: [1:Well defined, not attached] [N/A:N/A] Wound Margin: [1:Medium (34-66%)] [N/A:N/A] Granulation A mount: [1:Pink, Pale] [N/A:N/A] Granulation Quality: [1:Medium (34-66%)] [N/A:N/A] Necrotic A mount: [1:Fat Layer (Subcutaneous Tissue)] [N/A:N/A] Exposed Structures: [1:Exposed: Yes Fascia: No Tendon: No Muscle: No Joint: No Bone: No Small (1-33%)] [N/A:N/A] Treatment Notes Electronic Signature(s) Signed: 02/18/2020 5:16:39 PM By: Linton Ham MD Signed: 02/18/2020 5:57:23 PM By: Carlene Coria RN Entered By: Linton Ham on 02/18/2020 15:30:12 -------------------------------------------------------------------------------- Multi-Disciplinary Care Plan Details Patient Name: Date of Service: Jonathan [name], Jonathan [name] 02/18/2020 1:45 PM Medical Record Number: [id number] Patient Account Number: [id number] Date of Birth/Sex: Treating RN: 06/12/1951 (68 y.o. [name] Primary Care Kalep Full: Harvin Hazel M Other Clinician: Referring Iliza Blankenbeckler: Treating  Ronny Korff/Extender: Jarold Song, Lyman Bishop M Weeks in Treatment: 2 Active Inactive Wound/Skin Impairment Nursing Diagnoses: Knowledge deficit related to ulceration/compromised skin integrity Goals: Patient/caregiver will verbalize understanding of skin care regimen Date Initiated: 01/30/2020 Target Resolution Date: 03/16/2020 Goal Status: Active Ulcer/skin breakdown will have a volume reduction of 50% by week 8 Date Initiated: 01/30/2020 Date Inactivated: 02/18/2020 Target Resolution Date: 03/31/2020 Goal Status: Unmet Unmet Reason: comorbities Ulcer/skin breakdown will have a volume reduction of 80% by week 12 Date Initiated: 02/18/2020 Target Resolution Date: 03/20/2020 Goal Status: Active Interventions: Assess patient/caregiver ability to obtain necessary supplies Assess patient/caregiver ability to perform ulcer/skin care regimen upon admission and as needed Provide education on ulcer and skin care Treatment Activities: Skin care regimen initiated : 01/30/2020 Topical wound management initiated : 01/30/2020 Notes: Electronic Signature(s) Signed: 02/18/2020 5:57:23 PM By: Carlene Coria RN Entered By: Carlene Coria on 02/18/2020 15:29:41 -------------------------------------------------------------------------------- Pain Assessment Details Patient Name: Date of Service: Jonathan [name], Jonathan [name] 02/18/2020 1:45 PM Medical Record Number: [id number] Patient Account Number: [id number] Date of Birth/Sex: Treating RN: 20-Aug-1950 (69 y.o. [name] Primary Care Shar Paez: Marykay Lex Other Clinician: Referring Jaicey Sweaney: Treating Blannie Shedlock/Extender: Jarold Song, Lyman Bishop M Weeks in Treatment: 2 Active Problems Location of Pain Severity and Description of Pain Patient Has Paino No Site Locations Pain Management and Medication Current Pain Management: Electronic Signature(s) Signed: 02/24/2020 4:12:59 PM By: Levan Hurst RN, BSN Entered By: Levan Hurst on 02/18/2020  14:30:33 -------------------------------------------------------------------------------- Wound Assessment Details Patient Name: Date of Service: Jonathan [name], Jonathan [name] 02/18/2020 1:45 PM Medical Record Number: [id number] Patient Account Number: [id number] Date of Birth/Sex: Treating RN: 08-25-1950 (69 y.o. [name] Primary Care Janisha Bueso: Harvin Hazel M Other Clinician: Referring Shelda Truby: Treating Dylyn Mclaren/Extender: Jarold Song, Lyman Bishop M Weeks in Treatment: 2 Wound Status Wound Number: 1 Primary Diabetic Wound/Ulcer of the Lower Extremity Etiology: Etiology: Wound Location: Right, Lateral Foot Wound Open Wounding Event: Gradually Appeared Status: Date Acquired: 12/26/2019 Comorbid Cataracts, Congestive Heart Failure, Hypertension, Myocardial Weeks Of Treatment: 2 History: Infarction, Hepatitis C, Type II Diabetes, End Stage Renal Disease, Clustered Wound: No Dementia, Neuropathy Photos Photo Uploaded By: Mikeal Hawthorne on 02/20/2020 15:58:13 Wound Measurements Length: (cm) 0.3 Width: (cm) 0.2 Depth: (cm) 0.1 Area: (cm) 0.047 Volume: (cm) 0.005 % Reduction in Area: 70.1% % Reduction in Volume: 92.1% Epithelialization: Small (1-33%) Tunneling: No Undermining: No Wound Description Classification: Grade 2 Wound Margin: Well defined, not attached Exudate Amount: Small Exudate Type: Serosanguineous Exudate Color: red, brown Foul Odor After Cleansing: No Slough/Fibrino Yes Wound Bed Granulation Amount: Medium (34-66%) Exposed Structure Granulation Quality: Pink, Pale  Fascia Exposed: No Necrotic Amount: Medium (34-66%) Fat Layer (Subcutaneous Tissue) Exposed: Yes Necrotic Quality: Adherent Slough Tendon Exposed: No Muscle Exposed: No Joint Exposed: No Bone Exposed: No Treatment Notes Wound #1 (Right, Lateral Foot) 1. Cleanse With Wound Cleanser 2. Periwound Care Skin Prep 3. Primary Dressing Applied Calcium Alginate Ag 4. Secondary  Dressing Foam Border Dressing Electronic Signature(s) Signed: 02/24/2020 4:12:59 PM By: Levan Hurst RN, BSN Entered By: Levan Hurst on 02/18/2020 14:33:23 -------------------------------------------------------------------------------- [place] Details Patient Name: Date of Service: Jonathan [name], Jonathan [name] 02/18/2020 1:45 PM Medical Record Number: [id number] Patient Account Number: [id number] Date of Birth/Sex: Treating RN: 22-Dec-1950 (69 y.o. [name] Primary Care Phillips Goulette: Harvin Hazel M Other Clinician: Referring Shjon Lizarraga: Treating Aniqua Briere/Extender: Jarold Song, Lyman Bishop M Weeks in Treatment: 2 Vital Signs Time Taken: 14:30 Temperature (F): 98.4 Height (in): 71 Pulse (bpm): 95 Weight (lbs): 145 Respiratory Rate (breaths/min): 16 Body Mass Index (BMI): 20.2 Blood Pressure (mmHg): 113/69 Reference Range: 80 - 120 mg / dl Electronic Signature(s) Signed: 02/24/2020 4:12:59 PM By: Levan Hurst RN, BSN Entered By: Levan Hurst on 02/18/2020 14:30:27

## 2020-03-03 ENCOUNTER — Encounter (HOSPITAL_BASED_OUTPATIENT_CLINIC_OR_DEPARTMENT_OTHER): Payer: Medicare Other | Admitting: Internal Medicine

## 2020-03-03 ENCOUNTER — Other Ambulatory Visit: Payer: Self-pay

## 2020-03-03 DIAGNOSIS — E11621 Type 2 diabetes mellitus with foot ulcer: Secondary | ICD-10-CM | POA: Diagnosis not present

## 2020-03-04 NOTE — Progress Notes (Signed)
Jonathan [name], Jonathan [name] ([id number]) Visit Report for 03/03/2020 Debridement Details Patient Name: Date of Service: Jonathan [name], Jonathan [name] 03/03/2020 1:00 PM Medical Record Number: [id number] Patient Account Number: [id number] Date of Birth/Sex: Treating RN: 1951-06-24 (68 y.o. [name]) Carlene Coria Primary Care Provider: Harvin Hazel M Other Clinician: Referring Provider: Treating Provider/Extender: Jarold Song, Lyman Bishop M Weeks in Treatment: 4 Debridement Performed for Assessment: Wound #1 Right,Lateral Foot Performed By: Physician Ricard Dillon., MD Debridement Type: Debridement Severity of Tissue Pre Debridement: Fat layer exposed Level of Consciousness (Pre-procedure): Awake and Alert Pre-procedure Verification/Time Out Yes - 13:15 Taken: Start Time: 13:15 Pain Control: Lidocaine 5% topical ointment T Area Debrided (L x W): otal 0.4 (cm) x 0.3 (cm) = 0.12 (cm) Tissue and other material debrided: Viable, Non-Viable, Callus, Slough, Subcutaneous, Skin: Dermis , Skin: Epidermis, Slough Level: Skin/Subcutaneous Tissue Debridement Description: Excisional Instrument: Blade, Forceps Bleeding: Moderate Hemostasis Achieved: Silver Nitrate End Time: 13:19 Procedural Pain: 0 Post Procedural Pain: 0 Response to Treatment: Procedure was tolerated well Level of Consciousness (Post- Awake and Alert procedure): Post Debridement Measurements of Total Wound Length: (cm) 0.4 Width: (cm) 0.3 Depth: (cm) 0.3 Volume: (cm) 0.028 Character of Wound/Ulcer Post Debridement: Improved Severity of Tissue Post Debridement: Fat layer exposed Post Procedure Diagnosis Same as Pre-procedure Electronic Signature(s) Signed: 03/04/2020 3:52:22 AM By: Linton Ham MD Signed: 03/04/2020 6:04:59 PM By: Carlene Coria RN Entered By: Linton Ham on 03/03/2020 13:34:50 -------------------------------------------------------------------------------- HPI Details Patient Name: Date of Service: Jonathan [name], Jonathan [name]  03/03/2020 1:00 PM Medical Record Number: [id number] Patient Account Number: [id number] Date of Birth/Sex: Treating RN: 01-Mar-1951 (68 y.o. [name] Primary Care Provider: Harvin Hazel M Other Clinician: Referring Provider: Treating Provider/Extender: Jarold Song, Lyman Bishop M Weeks in Treatment: 4 History of Present Illness HPI Description: ADMISSION 01/30/2020 This is a 69 year old man who was hospitalized with Covid for a month in January 2021. Subsequently he was discharged to [facility] skilled facility for rehabilitation. His wife states that when he got home he had areas on the right lateral heel, right lateral fifth metatarsal head. They have been using Santyl to the right lateral heel no dressing on the right lateral foot. Our intake nurse discovered in small area on the right lateral great toe. He is minimally ambulatory at this point. He does not have a known history of PAD although he is a diabetic. 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 Still a small open area on the medial ffirth metatarsal head. But this is also better. He has not heard anything about his arterial studies that we ordered 7/13; patient's review with vein and vascular/noninvasive testing is not till 8/3. Still an open area on the right lateral first met head. The area on the right lateral heel remains closed although it is callused 7/27; arterial studies on 8 3. Still has the open area on the right fifth metatarsal  head. We have been using Hydrofera Blue Electronic Signature(s) Signed: 03/04/2020 3:52:22 AM By: Linton Ham MD Entered By: Linton Ham on 03/03/2020 13:38:42 -------------------------------------------------------------------------------- Physical Exam Details  Patient Name: Date of Service: Jonathan [name], Jonathan [name] 03/03/2020 1:00 PM Medical Record Number: [id number] Patient Account Number: [id number] Date of Birth/Sex: Treating RN: 1950-11-06 (68 y.o. [name]) Carlene Coria Primary Care Provider: Harvin Hazel M Other Clinician: Referring Provider: Treating Provider/Extender: Jarold Song, Lyman Bishop M Weeks in Treatment: 4 Constitutional Sitting or standing Blood Pressure is within target range for patient.. Pulse regular and within target range for patient.Marland Kitchen Respirations regular, non-labored and within target range.. Temperature is normal and within the target range for the patient.Marland Kitchen Appears in no distress. Cardiovascular Pedal pulses are difficult to feel. Notes Wound exam; on the right lateral calcaneus the area remains closed On the right lateral fifth metatarsal head there is a small wound. However it undermines. Small and punched out. I used pickups and a #15 scalpel to remove the skin and subcutaneous tissue from around the area. Hemostasis with silver nitrate and direct pressure Electronic Signature(s) Signed: 03/04/2020 3:52:22 AM By: Linton Ham MD Entered By: Linton Ham on 03/03/2020 13:37:51 -------------------------------------------------------------------------------- Physician Orders Details Patient Name: Date of Service: Jonathan [name], Jonathan [name] 03/03/2020 1:00 PM Medical Record Number: [id number] Patient Account Number: [id number] Date of Birth/Sex: Treating RN: 1951/07/13 (68 y.o. [name]) Carlene Coria Primary Care Provider: Harvin Hazel M Other Clinician: Referring Provider: Treating Provider/Extender: Jarold Song, Lyman Bishop M Weeks in Treatment: 4 Verbal / Phone  Orders: No Diagnosis Coding ICD-10 Coding Code Description E11.621 Type 2 diabetes mellitus with foot ulcer L97.512 Non-pressure chronic ulcer of other part of right foot with fat layer exposed L97.411 Non-pressure chronic ulcer of right heel and midfoot limited to breakdown of skin E11.51 Type 2 diabetes mellitus with diabetic peripheral angiopathy without gangrene Follow-up Appointments Return Appointment in 2 weeks. Dressing Change Frequency Wound #1 Right,Lateral Foot Change Dressing every other day. Wound Cleansing Wound #1 Right,Lateral Foot May shower with protection. - use a cast protector on all other days not changing the dressings. May shower and wash wound with soap and water. - with dressing changes. Primary Wound Dressing Wound #1 Right,Lateral Foot Hydrofera Blue - READY Secondary Dressing Wound #1 Right,Lateral Foot Foam Border - or gauze and kerlix Off-Loading Open toe surgical shoe to: - right foot Other: - float right heel with pillows to offload pressure to heel while resting in chair and bed. [facility] skilled nursing for wound care. Alvis Lemmings Electronic Signature(s) Signed: 03/04/2020 3:52:22 AM By: Linton Ham MD Signed: 03/04/2020 6:04:59 PM By: Carlene Coria RN Entered By: Carlene Coria on 03/03/2020 13:21:03 -------------------------------------------------------------------------------- Problem List Details Patient Name: Date of Service: Jonathan [name], Jonathan [name] 03/03/2020 1:00 PM Medical Record Number: [id number] Patient Account Number: [id number] Date of Birth/Sex: Treating RN: 1951-07-19 (68 y.o. [name] Primary Care Provider: Harvin Hazel M Other Clinician: Referring Provider: Treating Provider/Extender: Jarold Song, Lyman Bishop M Weeks in Treatment: 4 Active Problems ICD-10 Encounter Code Description Active Date MDM Diagnosis E11.621 Type 2 diabetes mellitus with foot ulcer 01/30/2020 No Yes L97.512  Non-pressure chronic ulcer of other part of right foot with fat layer exposed 01/30/2020 No Yes L97.411 Non-pressure chronic ulcer of right heel and midfoot limited to breakdown of 01/30/2020 No Yes skin E11.51 Type 2 diabetes mellitus with diabetic peripheral angiopathy without gangrene 01/30/2020 No Yes Inactive Problems Resolved Problems Electronic Signature(s) Signed: 03/04/2020 3:52:22 AM By: Linton Ham MD Entered By: Linton Ham on 03/03/2020 13:34:34 -------------------------------------------------------------------------------- Progress Note Details Patient Name: Date of Service: Jonathan [name], Jonathan [name] 03/03/2020 1:00 PM Medical Record Number:  [id number] Patient Account Number: [id number] Date of Birth/Sex: Treating RN: [birth year] (68 y.o. [name]) Carlene Coria Primary Care Provider: Harvin Hazel M Other Clinician: Referring Provider: Treating Provider/Extender: Jarold Song, Lyman Bishop M Weeks in Treatment: 4 Subjective History of Present Illness (HPI) ADMISSION 01/30/2020 This is a 69 year old man who was hospitalized with Covid for a month in January 2021. Subsequently he was discharged to [facility] skilled facility for rehabilitation. His wife states that when he got home he had areas on the right lateral heel, right lateral fifth metatarsal head. They have been using Santyl to the right lateral heel no dressing on the right lateral foot. Our intake nurse discovered in small area on the right lateral great toe. He is minimally ambulatory at this point. He does not have a known history of PAD although he is a diabetic. 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 Still a small open area on the medial ffirth metatarsal head. But this is also better. He has not heard anything about his arterial studies that we ordered 7/13; patient's review with vein and vascular/noninvasive testing is not till 8/3. Still an open area on the right lateral first met head. The area on the right lateral heel remains closed although it is callused 7/27; arterial studies on 8 3. Still has the open area on the right fifth metatarsal head. We have been using Hydrofera Blue Objective Constitutional Sitting or standing Blood Pressure is within target range for patient.. Pulse regular and within target range for patient.Marland Kitchen Respirations regular, non-labored and within target range.. Temperature is normal and within the target range for the patient.Marland Kitchen Appears in no distress. Vitals Time Taken: 12:56 PM, Height: 71 in, Source: Stated, Weight: 145 lbs, Source: Stated, BMI: 20.2, Temperature: 98.8 F, Pulse: 90 bpm, Respiratory Rate: 18 breaths/min, Blood Pressure: 129/77 mmHg, Capillary Blood Glucose: 117 mg/dl. General Notes: glucose per pt report this am Cardiovascular Pedal pulses are difficult to feel. General Notes: Wound exam; on the right lateral calcaneus the area remains closed ooOn the right lateral fifth metatarsal head there is a small wound. However it undermines. Small and punched out. I used pickups and a #15 scalpel to remove the skin and subcutaneous tissue from around the area. Hemostasis with silver nitrate and direct pressure Integumentary (Hair, Skin) Wound #1 status is Open. Original cause of wound was Gradually Appeared. The wound is located on the Right,Lateral Foot. The wound measures 0.4cm length x 0.3cm width x 0.3cm depth; 0.094cm^2 area and 0.028cm^3 volume. There is Fat Layer (Subcutaneous  Tissue) Exposed exposed. There is no tunneling noted, however, there is undermining starting at 12:00 and ending at 12:00 with a maximum distance of 0.3cm. There is a small amount of purulent drainage noted. The wound margin is well defined and not attached to the wound base. There is small (1-33%) pink, pale granulation within the wound bed. There is a large (67-100%) amount of necrotic tissue within the wound bed including Adherent Slough. 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 There was a Excisional Skin/Subcutaneous Tissue Debridement with a total area of 0.12 sq cm performed by Ricard Dillon., MD. With the following instrument(s): Blade, and Forceps to remove Viable and Non-Viable tissue/material. Material removed includes Callus, Subcutaneous Tissue, Slough, Skin: Dermis, and Skin: Epidermis after achieving pain control using Lidocaine 5% topical ointment. No specimens were taken. A time out was conducted at 13:15, prior to the start of the procedure. A Moderate amount of bleeding was controlled with Silver Nitrate. The procedure was tolerated well with a pain level of 0 throughout and a pain level of 0 following the procedure. Post Debridement Measurements: 0.4cm length x 0.3cm width x 0.3cm depth; 0.028cm^3 volume. Character of Wound/Ulcer Post Debridement is improved. Severity of Tissue Post Debridement is: Fat layer exposed. Post procedure Diagnosis Wound #1: Same as Pre-Procedure Plan Follow-up Appointments: Return  Appointment in 2 weeks. Dressing Change Frequency: Wound #1 Right,Lateral Foot: Change Dressing every other day. Wound Cleansing: Wound #1 Right,Lateral Foot: May shower with protection. - use a cast protector on all other days not changing the dressings. May shower and wash wound with soap and water. - with dressing changes. Primary Wound Dressing: Wound #1 Right,Lateral Foot: Hydrofera Blue - READY Secondary Dressing: Wound #1 Right,Lateral Foot: Foam Border - or gauze and kerlix Off-Loading: Open toe surgical shoe to: - right foot Other: - float right heel with pillows to offload pressure to heel while resting in chair and bed. Home Health: [place] skilled nursing for wound care. - [facility]. I change the primary dressing to Hydrofera Blue 2. Looking forward to arterial studies on 8/3. We did manage to heal the wound on the lateral heel Electronic Signature(s) Signed: 03/04/2020 3:52:22 AM By: Linton Ham MD Entered By: Linton Ham on 03/03/2020 13:41:01 -------------------------------------------------------------------------------- SuperBill Details Patient Name: Date of Service: Jonathan [name], Jonathan [name] 03/03/2020 Medical Record Number: [id number] Patient Account Number: [id number] Date of Birth/Sex: Treating RN: 03/23/1951 (68 y.o. [name] Primary Care Provider: Harvin Hazel M Other Clinician: Referring Provider: Treating Provider/Extender: Jarold Song, Lyman Bishop M Weeks in Treatment: 4 Diagnosis Coding ICD-10 Codes Code Description E11.621 Type 2 diabetes mellitus with foot ulcer L97.512 Non-pressure chronic ulcer of other part of right foot with fat layer exposed L97.411 Non-pressure chronic ulcer of right heel and midfoot limited to breakdown of skin E11.51 Type 2 diabetes mellitus with diabetic peripheral angiopathy without gangrene Facility Procedures CPT4 Code: [id number] Description: 47829 - DEB SUBQ TISSUE 20 SQ CM/< ICD-10 Diagnosis  Description E11.621 Type 2 diabetes mellitus with foot ulcer L97.512 Non-pressure chronic ulcer of other part of right foot with fat layer exposed Modifier: Quantity: 1 Physician Procedures : CPT4 Code Description Modifier [id number] - WC PHYS SUBQ TISS 20 SQ CM ICD-10 Diagnosis Description E11.621 Type 2 diabetes mellitus with foot ulcer L97.512 Non-pressure chronic ulcer of other part of right foot with fat layer exposed Quantity: 1 Electronic Signature(s) Signed: 03/04/2020 3:52:22 AM By: Linton Ham MD Entered By: Linton Ham on 03/03/2020 13:41:23

## 2020-03-06 NOTE — Progress Notes (Signed)
LEONA, PRESSLY ([id number]) Visit Report for 03/03/2020 Arrival Information Details Patient Name: Date of Service: Jonathan [name], Jonathan [name] 03/03/2020 1:00 PM Medical Record Number: [id number] Patient Account Number: [id number] Date of Birth/Sex: Treating RN: 10/14/1950 (69 y.o. [name] Primary Care Eulamae Greenstein: Harvin Hazel M Other Clinician: Referring Riot Barrick: Treating Kimothy Kishimoto/Extender: Jarold Song, Elder Negus Weeks in Treatment: 4 Visit Information History Since Last Visit Added or deleted any medications: No Patient Arrived: Wheel Chair Any new allergies or adverse reactions: No Arrival Time: 12:50 Had a fall or experienced change in No Accompanied By: spouse activities of daily living that may affect Transfer Assistance: None risk of falls: Patient Identification Verified: Yes Signs or symptoms of abuse/neglect since No Secondary Verification Process Completed: Yes last visito Patient Has Alerts: Yes Hospitalized since last visit: No Patient Alerts: Patient on Blood Thinner Implantable device outside of the clinic No Right ABI: non compress excluding cellular tissue based products placed in the center since last visit: Has Dressing in Place as Prescribed: Yes Has Footwear/Offloading in Place as Yes Prescribed: Right: Surgical Shoe with Pressure Relief Insole Pain Present Now: No Electronic Signature(s) Signed: 03/03/2020 5:47:23 PM By: Baruch Gouty RN, BSN Entered By: Baruch Gouty on 03/03/2020 12:56:07 -------------------------------------------------------------------------------- Encounter Discharge Information Details Patient Name: Date of Service: Jonathan [name], Jonathan [name] 03/03/2020 1:00 PM Medical Record Number: [id number] Patient Account Number: [id number] Date of Birth/Sex: Treating RN: 08-06-1951 (69 y.o. [name] Primary Care Dredyn Gubbels: Harvin Hazel M Other Clinician: Referring Leani Myron: Treating Takai Chiaramonte/Extender: Jarold Song,  Lyman Bishop M Weeks in Treatment: 4 Encounter Discharge Information Items Post Procedure Vitals Discharge Condition: Stable Temperature (F): 98.8 Ambulatory Status: Wheelchair Pulse (bpm): 90 Discharge Destination: Home Respiratory Rate (breaths/min): 18 Transportation: Private Auto Blood Pressure (mmHg): 129/77 Accompanied By: wife Schedule Follow-up Appointment: Yes Clinical Summary of Care: Patient Declined Electronic Signature(s) Signed: 03/06/2020 5:02:39 PM By: Kela Millin Entered By: Kela Millin on 03/03/2020 13:35:31 -------------------------------------------------------------------------------- Lower Extremity Assessment Details Patient Name: Date of Service: Jonathan [name], Jonathan [name] 03/03/2020 1:00 PM Medical Record Number: [id number] Patient Account Number: [id number] Date of Birth/Sex: Treating RN: [birth year] (69 y.o. [name] Primary Care Aireona Torelli: Harvin Hazel M Other Clinician: Referring Satcha Storlie: Treating Felice Hope/Extender: Jarold Song, Lyman Bishop M Weeks in Treatment: 4 Edema Assessment Assessed: Shirlyn Goltz: No] [Right: No] Edema: [Left: N] [Right: o] Calf Left: Right: Point of Measurement: 41 cm From Medial Instep cm 35.5 cm Ankle Left: Right: Point of Measurement: 18 cm From Medial Instep cm 19.5 cm Vascular Assessment Pulses: Dorsalis Pedis Palpable: [Right:Yes] Electronic Signature(s) Signed: 03/03/2020 5:47:23 PM By: Baruch Gouty RN, BSN Entered By: Baruch Gouty on 03/03/2020 12:59:32 -------------------------------------------------------------------------------- Multi Wound Chart Details Patient Name: Date of Service: Jonathan [name], Jonathan [name] 03/03/2020 1:00 PM Medical Record Number: [id number] Patient Account Number: [id number] Date of Birth/Sex: Treating RN: [birth year] (68 y.o. [name] Primary Care Hank Walling: Harvin Hazel M Other Clinician: Referring Kenyata Napier: Treating Rooney Gladwin/Extender: Jarold Song, Lyman Bishop  M Weeks in Treatment: 4 Vital Signs Height(in): 71 Capillary Blood Glucose(mg/dl): 117 Weight(lbs): 145 Pulse(bpm): 87 Body Mass Index(BMI): 20 Blood Pressure(mmHg): 129/77 Temperature(F): 98.8 Respiratory Rate(breaths/min): 18 Photos: [1:No Photos Right, Lateral Foot] [N/A:N/A N/A] Wound Location: [1:Gradually Appeared] [N/A:N/A] Wounding Event: [1:Diabetic Wound/Ulcer of the Lower] [N/A:N/A] Primary Etiology: [1:Extremity Cataracts, Congestive Heart Failure,] [N/A:N/A] Comorbid History: [1:Hypertension, Myocardial Infarction, Hepatitis C, Type II Diabetes, End Stage Renal Disease, Dementia, Neuropathy 12/26/2019] [N/A:N/A] Date Acquired: [1:4] [N/A:N/A] Weeks of Treatment: [1:Open] [N/A:N/A] Wound Status: [1:0.4x0.3x0.3] [N/A:N/A] Measurements  L x W x D (cm) [1:0.094] [N/A:N/A] A (cm) : rea [1:0.028] [N/A:N/A] Volume (cm) : [1:40.10%] [N/A:N/A] % Reduction in A [1:rea: 55.60%] [N/A:N/A] % Reduction in Volume: [1:12] Starting Position 1 (o'clock): [1:12] Ending Position 1 (o'clock): [1:0.3] Maximum Distance 1 (cm): [1:Yes] [N/A:N/A] Undermining: [1:Grade 2] [N/A:N/A] Classification: [1:Small] [N/A:N/A] Exudate A mount: [1:Purulent] [N/A:N/A] Exudate Type: [1:yellow, brown, green] [N/A:N/A] Exudate Color: [1:Well defined, not attached] [N/A:N/A] Wound Margin: [1:Small (1-33%)] [N/A:N/A] Granulation A mount: [1:Pink, Pale] [N/A:N/A] Granulation Quality: [1:Large (67-100%)] [N/A:N/A] Necrotic A mount: [1:Fat Layer (Subcutaneous Tissue)] [N/A:N/A] Exposed Structures: [1:Exposed: Yes Fascia: No Tendon: No Muscle: No Joint: No Bone: No None] [N/A:N/A] Epithelialization: [1:Debridement - Excisional] [N/A:N/A] Debridement: Pre-procedure Verification/Time Out 13:15 [N/A:N/A] Taken: [1:Lidocaine 5% topical ointment] [N/A:N/A] Pain Control: [1:Callus, Subcutaneous, Slough] [N/A:N/A] Tissue Debrided: [1:Skin/Subcutaneous Tissue] [N/A:N/A] Level: [1:0.12] [N/A:N/A] Debridement A  (sq cm): [1:rea Blade, Forceps] [N/A:N/A] Instrument: [1:Moderate] [N/A:N/A] Bleeding: [1:Silver Nitrate] [N/A:N/A] Hemostasis A chieved: [1:0] [N/A:N/A] Procedural Pain: [1:0] [N/A:N/A] Post Procedural Pain: [1:Procedure was tolerated well] [N/A:N/A] Debridement Treatment Response: [1:0.4x0.3x0.3] [N/A:N/A] Post Debridement Measurements L x W x D (cm) [1:0.028] [N/A:N/A] Post Debridement Volume: (cm) [1:Debridement] [N/A:N/A] Treatment Notes Wound #1 (Right, Lateral Foot) 1. Cleanse With Wound Cleanser 2. Periwound Care Skin Prep 3. Primary Dressing Applied Hydrofera Blue 4. Secondary Dressing Foam Border Dressing Electronic Signature(s) Signed: 03/04/2020 3:52:22 AM By: Linton Ham MD Signed: 03/04/2020 6:04:59 PM By: Carlene Coria RN Entered By: Linton Ham on 03/03/2020 13:34:42 -------------------------------------------------------------------------------- Multi-Disciplinary Care Plan Details Patient Name: Date of Service: Jonathan [name], Jonathan [name] 03/03/2020 1:00 PM Medical Record Number: [id number] Patient Account Number: [id number] Date of Birth/Sex: Treating RN: November 18, 1950 (68 y.o. [name] Primary Care Joniece Smotherman: Marykay Lex Other Clinician: Referring Edie Vallandingham: Treating Jedd Schulenburg/Extender: Jarold Song, Lyman Bishop M Weeks in Treatment: 4 Active Inactive Wound/Skin Impairment Nursing Diagnoses: Knowledge deficit related to ulceration/compromised skin integrity Goals: Patient/caregiver will verbalize understanding of skin care regimen Date Initiated: 01/30/2020 Target Resolution Date: 03/16/2020 Goal Status: Active Ulcer/skin breakdown will have a volume reduction of 50% by week 8 Date Initiated: 01/30/2020 Date Inactivated: 02/18/2020 Target Resolution Date: 03/31/2020 Goal Status: Unmet Unmet Reason: comorbities Ulcer/skin breakdown will have a volume reduction of 80% by week 12 Date Initiated: 02/18/2020 Target Resolution Date: 03/20/2020 Goal  Status: Active Interventions: Assess patient/caregiver ability to obtain necessary supplies Assess patient/caregiver ability to perform ulcer/skin care regimen upon admission and as needed Provide education on ulcer and skin care Treatment Activities: Skin care regimen initiated : 01/30/2020 Topical wound management initiated : 01/30/2020 Notes: Electronic Signature(s) Signed: 03/04/2020 6:04:59 PM By: Carlene Coria RN Entered By: Carlene Coria on 03/03/2020 13:17:15 -------------------------------------------------------------------------------- Pain Assessment Details Patient Name: Date of Service: Jonathan [name], Jonathan [name] 03/03/2020 1:00 PM Medical Record Number: [id number] Patient Account Number: [id number] Date of Birth/Sex: Treating RN: August 18, 1950 (69 y.o. [name] Primary Care Markee Remlinger: Harvin Hazel M Other Clinician: Referring Dellene Mcgroarty: Treating Ivey Nembhard/Extender: Jarold Song, Lyman Bishop M Weeks in Treatment: 4 Active Problems Location of Pain Severity and Description of Pain Patient Has Paino No Site Locations Rate the pain. Rate the pain. Current Pain Level: 0 Character of Pain Describe the Pain: Tender Pain Management and Medication Current Pain Management: Electronic Signature(s) Signed: 03/03/2020 5:47:23 PM By: Baruch Gouty RN, BSN Entered By: Baruch Gouty on 03/03/2020 12:59:22 -------------------------------------------------------------------------------- Patient/Caregiver Education Details Patient Name: Date of Service: Jonathan [name], Jonathan [name] 7/27/2021andnbsp1:00 PM Medical Record Number: [id number] Patient Account Number: [id number] Date of Birth/Gender: Treating RN: 1950/12/13 (69 y.o. M) Epps,  Morey Hummingbird Primary Care Physician: Marykay Lex Other Clinician: Referring Physician: Treating Physician/Extender: Jarold Song, Lavona [place] in Treatment: 4 Education Assessment Education Provided To: Patient Education Topics  Provided Wound/Skin Impairment: Methods: Explain/Verbal Responses: State content correctly Electronic Signature(s) Signed: 03/04/2020 6:04:59 PM By: Carlene Coria RN Entered By: Carlene Coria on 03/03/2020 13:17:44 -------------------------------------------------------------------------------- Wound Assessment Details Patient Name: Date of Service: Jonathan [name], Jonathan [name] 03/03/2020 1:00 PM Medical Record Number: [id number] Patient Account Number: [id number] Date of Birth/Sex: Treating RN: 02-Oct-1950 (69 y.o. [name] Primary Care Nat Lowenthal: Other Clinician: Harvin Hazel M Referring Labrina Lines: Treating Brookley Spitler/Extender: Jarold Song, Lyman Bishop M Weeks in Treatment: 4 Wound Status Wound Number: 1 Primary Diabetic Wound/Ulcer of the Lower Extremity Etiology: Wound Location: Right, Lateral Foot Wound Open Wounding Event: Gradually Appeared Status: Date Acquired: 12/26/2019 Comorbid Cataracts, Congestive Heart Failure, Hypertension, Myocardial Weeks Of Treatment: 4 History: Infarction, Hepatitis C, Type II Diabetes, End Stage Renal Disease, Clustered Wound: No Dementia, Neuropathy Photos Photo Uploaded By: Mikeal Hawthorne on 03/04/2020 13:36:42 Wound Measurements Length: (cm) 0.4 Width: (cm) 0.3 Depth: (cm) 0.3 Area: (cm) 0.094 Volume: (cm) 0.028 % Reduction in Area: 40.1% % Reduction in Volume: 55.6% Epithelialization: None Tunneling: No Undermining: Yes Starting Position (o'clock): 12 Ending Position (o'clock): 12 Maximum Distance: (cm) 0.3 Wound Description Classification: Grade 2 Wound Margin: Well defined, not attached Exudate Amount: Small Exudate Type: Purulent Exudate Color: yellow, brown, green Foul Odor After Cleansing: No Slough/Fibrino Yes Wound Bed Granulation Amount: Small (1-33%) Exposed Structure Granulation Quality: Pink, Pale Fascia Exposed: No Necrotic Amount: Large (67-100%) Fat Layer (Subcutaneous Tissue) Exposed: Yes Necrotic  Quality: Adherent Slough Tendon Exposed: No Muscle Exposed: No Joint Exposed: No Bone Exposed: No Treatment Notes Wound #1 (Right, Lateral Foot) 1. Cleanse With Wound Cleanser 2. Periwound Care Skin Prep 3. Primary Dressing Applied Hydrofera Blue 4. Secondary Dressing Foam Border Dressing Electronic Signature(s) Signed: 03/03/2020 5:47:23 PM By: Baruch Gouty RN, BSN Entered By: Baruch Gouty on 03/03/2020 13:01:17 -------------------------------------------------------------------------------- [place] Details Patient Name: Date of Service: Jonathan [name], Jonathan [name] 03/03/2020 1:00 PM Medical Record Number: [id number] Patient Account Number: [id number] Date of Birth/Sex: Treating RN: Oct 17, 1950 (69 y.o. [name] Primary Care Purl Claytor: Harvin Hazel M Other Clinician: Referring Narayan Scull: Treating Makayela Secrest/Extender: Jarold Song, Lyman Bishop M Weeks in Treatment: 4 Vital Signs Time Taken: 12:56 Temperature (F): 98.8 Height (in): 71 Pulse (bpm): 90 Source: Stated Respiratory Rate (breaths/min): 18 Weight (lbs): 145 Blood Pressure (mmHg): 129/77 Source: Stated Capillary Blood Glucose (mg/dl): 117 Body Mass Index (BMI): 20.2 Reference Range: 80 - 120 mg / dl Notes glucose per pt report this am Electronic Signature(s) Signed: 03/03/2020 5:47:23 PM By: Baruch Gouty RN, BSN Entered By: Baruch Gouty on 03/03/2020 12:56:45

## 2020-03-09 ENCOUNTER — Other Ambulatory Visit (HOSPITAL_COMMUNITY): Payer: Self-pay | Admitting: Internal Medicine

## 2020-03-09 DIAGNOSIS — E1151 Type 2 diabetes mellitus with diabetic peripheral angiopathy without gangrene: Secondary | ICD-10-CM

## 2020-03-10 ENCOUNTER — Ambulatory Visit (HOSPITAL_COMMUNITY)
Admission: RE | Admit: 2020-03-10 | Discharge: 2020-03-10 | Disposition: A | Discharge status: Home / Self Care | Payer: Medicare Other | Source: Ambulatory Visit | Attending: Vascular Surgery | Admitting: Vascular Surgery

## 2020-03-10 ENCOUNTER — Other Ambulatory Visit: Payer: Self-pay

## 2020-03-10 DIAGNOSIS — E1151 Type 2 diabetes mellitus with diabetic peripheral angiopathy without gangrene: Secondary | ICD-10-CM | POA: Diagnosis present

## 2020-03-17 ENCOUNTER — Encounter (HOSPITAL_BASED_OUTPATIENT_CLINIC_OR_DEPARTMENT_OTHER): Payer: Medicare Other | Attending: Internal Medicine | Admitting: Internal Medicine

## 2020-03-17 DIAGNOSIS — L97411 Non-pressure chronic ulcer of right heel and midfoot limited to breakdown of skin: Secondary | ICD-10-CM | POA: Insufficient documentation

## 2020-03-17 DIAGNOSIS — Z940 Kidney transplant status: Secondary | ICD-10-CM | POA: Insufficient documentation

## 2020-03-17 DIAGNOSIS — I110 Hypertensive heart disease with heart failure: Secondary | ICD-10-CM | POA: Insufficient documentation

## 2020-03-17 DIAGNOSIS — Z992 Dependence on renal dialysis: Secondary | ICD-10-CM | POA: Diagnosis not present

## 2020-03-17 DIAGNOSIS — E1151 Type 2 diabetes mellitus with diabetic peripheral angiopathy without gangrene: Secondary | ICD-10-CM | POA: Diagnosis not present

## 2020-03-17 DIAGNOSIS — E11621 Type 2 diabetes mellitus with foot ulcer: Secondary | ICD-10-CM | POA: Diagnosis not present

## 2020-03-17 DIAGNOSIS — I2510 Atherosclerotic heart disease of native coronary artery without angina pectoris: Secondary | ICD-10-CM | POA: Diagnosis not present

## 2020-03-17 DIAGNOSIS — L97512 Non-pressure chronic ulcer of other part of right foot with fat layer exposed: Secondary | ICD-10-CM | POA: Diagnosis not present

## 2020-03-17 DIAGNOSIS — Y838 Other surgical procedures as the cause of abnormal reaction of the patient, or of later complication, without mention of misadventure at the time of the procedure: Secondary | ICD-10-CM | POA: Diagnosis not present

## 2020-03-17 DIAGNOSIS — I509 Heart failure, unspecified: Secondary | ICD-10-CM | POA: Insufficient documentation

## 2020-03-17 DIAGNOSIS — Z8616 Personal history of COVID-19: Secondary | ICD-10-CM | POA: Insufficient documentation

## 2020-03-17 DIAGNOSIS — T8642 Liver transplant failure: Secondary | ICD-10-CM | POA: Insufficient documentation

## 2020-03-17 NOTE — Progress Notes (Signed)
Jonathan, [name] ([id number]) Visit Report for 03/17/2020 Debridement Details Patient Name: Date of Service: Jonathan [name], Jonathan [name] 03/17/2020 1:00 PM Medical Record Number: [id number] Patient Account Number: [id number] Date of Birth/Sex: Treating RN: [birth year] (69 y.o. Jonathan [name]) Carlene Coria Primary Care Provider: Harvin Hazel M Other Clinician: Referring Provider: Treating Provider/Extender: [place] Callas, Lyman Bishop M Weeks in Treatment: 6 Debridement Performed for Assessment: Wound #1 Right,Lateral Foot Performed By: Physician Tobi Bastos, MD Debridement Type: Debridement Severity of Tissue Pre Debridement: Fat layer exposed Level of Consciousness (Pre-procedure): Awake and Alert Pre-procedure Verification/Time Out Yes - 13:55 Taken: Start Time: 13:55 Pain Control: Lidocaine 5% topical ointment T Area Debrided (L x W): otal 0.7 (cm) x 0.7 (cm) = 0.49 (cm) Tissue and other material debrided: Viable, Non-Viable, Slough, Subcutaneous, Skin: Dermis , Skin: Epidermis, Slough Level: Skin/Subcutaneous Tissue Debridement Description: Excisional Instrument: Curette Bleeding: Minimum Hemostasis Achieved: Pressure End Time: 13:55 Procedural Pain: 0 Post Procedural Pain: 0 Response to Treatment: Procedure was tolerated well Level of Consciousness (Post- Awake and Alert procedure): Post Debridement Measurements of Total Wound Length: (cm) 0.7 Width: (cm) 0.7 Depth: (cm) 0.3 Volume: (cm) 0.115 Character of Wound/Ulcer Post Debridement: Improved Severity of Tissue Post Debridement: Fat layer exposed Post Procedure Diagnosis Same as Pre-procedure Electronic Signature(s) Signed: 03/17/2020 4:52:06 PM By: Tobi Bastos MD, MBA Signed: 03/17/2020 5:05:11 PM By: Carlene Coria RN Entered By: Carlene Coria on 03/17/2020 13:59:32 -------------------------------------------------------------------------------- HPI Details Patient Name: Date of Service: Jonathan [name], Jonathan [name] 03/17/2020 1:00 PM Medical  Record Number: [id number] Patient Account Number: [id number] Date of Birth/Sex: Treating RN: [birth year] (69 y.o. [name] Primary Care Provider: Harvin Hazel M Other Clinician: Referring Provider: Treating Provider/Extender: [place] Callas, Lyman Bishop M Weeks in Treatment: 6 History of Present Illness HPI Description: ADMISSION 01/30/2020 This is a 69 year old man who was hospitalized with Covid for a month in January 2021. Subsequently he was discharged to [facility] skilled facility for rehabilitation. His wife states that when he got home he had areas on the right lateral heel, right lateral fifth metatarsal head. They have been using Santyl to the right lateral heel no dressing on the right lateral foot. Our intake nurse discovered in small area on the right lateral great toe. He is minimally ambulatory at this point. He does not have a known history of PAD although he is a diabetic. 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 Still a small open area on the medial ffirth metatarsal head. But this is also better. He has not heard anything about his arterial studies that we ordered 7/13; patient's review with vein and vascular/noninvasive testing is not till 8/3. Still an open area on the right lateral first met head. The area on the right lateral heel remains closed although it is callused 7/27; arterial studies on 8 3. Still has the open area on the right fifth metatarsal head. We have been using  Hydrofera Blue 8/10-Comes a 2 weeks, had his vascular studies done, ABIs on both sides are reasonably good, TBI on the right is 145 Continuing with  Hydrofera Blue, the wound is slightly larger than last time Electronic Signature(s) Signed: 03/17/2020 2:02:00 PM By: Tobi Bastos MD, MBA Entered By: Tobi Bastos on 03/17/2020 14:02:00 -------------------------------------------------------------------------------- Physical Exam Details Patient Name: Date of Service: Jonathan, [name] 03/17/2020 1:00 PM Medical Record Number: [id number] Patient Account Number: [id number] Date of Birth/Sex: Treating RN: 1950-09-28 (69 y.o. [name] Primary Care Provider: Harvin Hazel M Other Clinician: Referring Provider: Treating Provider/Extender: [place] Callas, Lyman Bishop M Weeks in Treatment: 6 Constitutional alert and oriented x 3. sitting or standing blood pressure is within target range for patient.. supine blood pressure is within target range for patient.. pulse regular and within target range for patient.Marland Kitchen respirations regular, non-labored and within target range for patient.Marland Kitchen temperature within target range for patient.. . . Well- nourished and well-hydrated in no acute distress. Notes Right fifth met head wound with some dried debris at the base that I try to move with a curette, granulation is pale, surrounding skin is hyperpigmented with not much callus formation Electronic Signature(s) Signed: 03/17/2020 2:02:39 PM By: Tobi Bastos MD, MBA Entered By: Tobi Bastos on 03/17/2020 14:02:39 -------------------------------------------------------------------------------- Physician Orders Details Patient Name: Date of Service: Jonathan, [name] 03/17/2020 1:00 PM Medical Record Number: [id number] Patient Account Number: [id number] Date of Birth/Sex: Treating RN: February 07, 1951 (69 y.o. [name] Primary Care Provider: Harvin Hazel M Other Clinician: Referring Provider: Treating  Provider/Extender: [place] Callas, Lyman Bishop M Weeks in Treatment: 6 Verbal / Phone Orders: No Diagnosis Coding ICD-10 Coding Code Description E11.621 Type 2 diabetes mellitus with foot ulcer L97.512 Non-pressure chronic ulcer of other part of right foot with fat layer exposed L97.411 Non-pressure chronic ulcer of right heel and midfoot limited to breakdown of skin E11.51 Type 2 diabetes mellitus with diabetic peripheral angiopathy without gangrene Follow-up Appointments Return Appointment in 2 weeks. Dressing Change Frequency Wound #1 Right,Lateral Foot Change Dressing every other day. Wound Cleansing Wound #1 Right,Lateral Foot May shower with protection. - use a cast protector on all other days not changing the dressings. May shower and wash wound with soap and water. - with dressing changes. Primary Wound Dressing Wound #1 Right,Lateral Foot Hydrofera Blue - READY Secondary Dressing Wound #1 Right,Lateral Foot Foam Border - or gauze and kerlix Off-Loading Open toe surgical shoe to: - right foot Other: - float right heel with pillows to offload pressure to heel while resting in chair and bed. Electronic Signature(s) Signed: 03/17/2020 4:52:06 PM By: Tobi Bastos MD, MBA Signed: 03/17/2020 5:05:11 PM By: Carlene Coria RN Entered By: Carlene Coria on 03/17/2020 13:46:54 -------------------------------------------------------------------------------- Problem List Details Patient Name: Date of Service: HIDEO, GOOGE [name] 03/17/2020 1:00 PM Medical Record Number: [id number] Patient Account Number: [id number] Date of Birth/Sex: Treating RN: 1951/06/11 (68 y.o. [name] Primary Care Provider: Harvin Hazel M Other Clinician: Referring Provider: Treating Provider/Extender: [place] Callas, Lyman Bishop M Weeks in Treatment: 6 Active Problems ICD-10 Encounter Code Description Active Date MDM Diagnosis E11.621 Type 2 diabetes mellitus with foot ulcer 01/30/2020 No  Yes L97.512 Non-pressure chronic ulcer of other part of right foot with fat layer exposed 01/30/2020 No Yes L97.411 Non-pressure chronic ulcer of right heel and midfoot limited to breakdown of 01/30/2020 No Yes skin E11.51 Type 2 diabetes mellitus with diabetic peripheral angiopathy without gangrene 01/30/2020 No Yes Inactive Problems Resolved Problems Electronic Signature(s) Signed: 03/17/2020 4:52:06 PM By: Tobi Bastos MD, MBA Signed: 03/17/2020 5:05:11 PM By: Carlene Coria RN Entered By: Carlene Coria on 03/17/2020 13:37:12 --------------------------------------------------------------------------------  Progress Note Details Patient Name: Date of Service: Jonathan [name], Jonathan [name] 03/17/2020 1:00 PM Medical Record Number: [id number] Patient Account Number: [id number] Date of Birth/Sex: Treating RN: August 25, 1950 (68 y.o. Jonathan [name]) Carlene Coria Primary Care Provider: Harvin Hazel M Other Clinician: Referring Provider: Treating Provider/Extender: [place] Callas, Lyman Bishop M Weeks in Treatment: 6 Subjective History of Present Illness (HPI) ADMISSION 01/30/2020 This is a 69 year old man who was hospitalized with Covid for a month in January 2021. Subsequently he was discharged to [facility] skilled facility for rehabilitation. His wife states that when he got home he had areas on the right lateral heel, right lateral fifth metatarsal head. They have been using Santyl to the right lateral heel no dressing on the right lateral foot. Our intake nurse discovered in small area on the right lateral great toe. He is minimally ambulatory at this point. He does not have a known history of PAD although he is a diabetic. 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 Still a small open area on the medial ffirth metatarsal head. But this is also better. He has not heard anything about his arterial studies that we ordered 7/13; patient's review with vein and vascular/noninvasive testing is not till 8/3. Still an open area on the right lateral first met head. The area on the right lateral heel remains closed although it is callused 7/27; arterial studies on 8 3. Still has the open area on the right fifth metatarsal head. We have been using Hydrofera Blue 8/10-Comes a 2 weeks, had his vascular studies done, ABIs on both sides are reasonably good, TBI on the right is 145 Continuing with Hydrofera Blue, the wound is slightly larger than last time Objective Constitutional alert and oriented x 3. sitting or standing blood pressure is within target range for patient.. supine blood pressure is within target range for patient.. pulse regular and within target range for patient.Marland Kitchen respirations regular, non-labored and within target range for patient.Marland Kitchen temperature within target range for patient.. Well- nourished and well-hydrated in no acute distress. Vitals Time Taken: 1:20 PM, Height: 71 in, Weight: 145 lbs, BMI: 20.2, Temperature: 98.8 F, Pulse: 91 bpm, Respiratory Rate: 16 breaths/min, Blood Pressure: 118/70 mmHg. General Notes: Right fifth met head wound with some dried debris at the base that I try to move with a curette, granulation is pale, surrounding skin is hyperpigmented with not much callus formation Integumentary (Hair, Skin) Wound #1 status is Open. Original cause of wound was Gradually Appeared. The wound is located on the Right,Lateral Foot. The wound measures 0.7cm length x 0.7cm width x 0.3cm depth;  0.385cm^2 area and 0.115cm^3 volume. There is Fat Layer (Subcutaneous Tissue) Exposed exposed. There is no tunneling noted, however, there is undermining starting at 12:00 and ending at 12:00 with a maximum distance of 0.4cm. There is a small amount of purulent drainage noted. The wound margin is well defined and not attached to the wound base. There is small (1-33%) pink, pale granulation within the wound bed. There is a large (67-100%) amount of necrotic tissue within the  wound bed including Eschar and Adherent Slough. 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 There was a Excisional Skin/Subcutaneous Tissue Debridement with a total area of 0.49 sq cm performed by Tobi Bastos, MD. With the following instrument(s): Curette to remove Viable and Non-Viable tissue/material. Material removed includes Subcutaneous Tissue, Slough, Skin: Dermis, and Skin: Epidermis after achieving pain control using Lidocaine 5% topical ointment. No specimens were taken. A time out was conducted at 13:55, prior to the start of the procedure. A Minimum amount of bleeding was controlled with Pressure. The procedure was tolerated well with a pain level of 0 throughout and a pain level of 0 following the procedure. Post Debridement Measurements: 0.7cm length x 0.7cm width x 0.3cm depth; 0.115cm^3 volume. Character of Wound/Ulcer Post Debridement is improved. Severity of Tissue Post Debridement is: Fat layer exposed. Post procedure Diagnosis Wound #1: Same as  Pre-Procedure Plan Follow-up Appointments: Return Appointment in 2 weeks. Dressing Change Frequency: Wound #1 Right,Lateral Foot: Change Dressing every other day. Wound Cleansing: Wound #1 Right,Lateral Foot: May shower with protection. - use a cast protector on all other days not changing the dressings. May shower and wash wound with soap and water. - with dressing changes. Primary Wound Dressing: Wound #1 Right,Lateral Foot: Hydrofera Blue - READY Secondary Dressing: Wound #1 Right,Lateral Foot: Foam Border - or gauze and kerlix Off-Loading: Open toe surgical shoe to: - right foot Other: - float right heel with pillows to offload pressure to heel while resting in chair and bed. -Continues in Hydrofera Blue to the right lateral foot -Float heel to offload pressure while resting -Return to clinic next week Electronic Signature(s) Signed: 03/17/2020 2:03:27 PM By: Tobi Bastos MD, MBA Entered By: Tobi Bastos on 03/17/2020 14:03:27 -------------------------------------------------------------------------------- SuperBill Details Patient Name: Date of Service: KACI, FREEL [name] 03/17/2020 Medical Record Number: [id number] Patient Account Number: [id number] Date of Birth/Sex: Treating RN: [birth year] (68 y.o. 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

## 2020-03-20 NOTE — Progress Notes (Signed)
Jonathan [name], SIRICO ([id number]) Visit Report for 03/17/2020 Arrival Information Details Patient Name: Date of Service: Jonathan [name] 03/17/2020 1:00 PM Medical Record Number: [id number] Patient Account Number: [id number] Date of Birth/Sex: Treating RN: Jan 12, 1951 (69 y.o. Jonathan [name], Jonathan [name] Primary Care Jonathan [name]: Jonathan [name] Other Clinician: Referring Sicilia Killough: Treating Tyia Binford/Extender: [place] Callas, Lyman Bishop [name] Weeks in Treatment: 6 Visit Information History Since Last Visit Added or deleted any medications: No Patient Arrived: Wheel Chair Any new allergies or adverse reactions: No Arrival Time: 13:20 Had a fall or experienced change in No Accompanied By: wife activities of daily living that may affect Transfer Assistance: None risk of falls: Patient Identification Verified: Yes Signs or symptoms of abuse/neglect since last visito No Secondary Verification Process Completed: Yes Hospitalized since last visit: No Patient Has Alerts: Yes Implantable device outside of the clinic excluding No Patient Alerts: Patient on Blood Thinner cellular tissue based products placed in the center Right ABI: non compress since last visit: Has Dressing in Place as Prescribed: Yes Pain Present Now: No Electronic Signature(s) Signed: 03/20/2020 5:19:54 PM By: Jonathan Hurst RN, BSN Entered By: Jonathan [name] on 03/17/2020 13:20:22 -------------------------------------------------------------------------------- Encounter Discharge Information Details Patient Name: Date of Service: Jonathan [name] 03/17/2020 1:00 PM Medical Record Number: [id number] Patient Account Number: [id number] Date of Birth/Sex: Treating RN: [birth year] (69 y.o. Jonathan [name] Primary Care Jonathan [name]: Jonathan [name] Other Clinician: Referring Olina Melfi: Treating Priyah Schmuck/Extender: [place] Callas, Lyman Bishop [name] Weeks in Treatment: 6 Encounter Discharge Information Items Post Procedure Vitals Discharge Condition:  Stable Temperature (F): 98.8 Ambulatory Status: Walker Pulse (bpm): 91 Discharge Destination: Home Respiratory Rate (breaths/min): 16 Transportation: Private Auto Blood Pressure (mmHg): 118/70 Accompanied By: family member Schedule Follow-up Appointment: Yes Clinical Summary of Care: Patient Declined Electronic Signature(s) Signed: 03/17/2020 5:12:21 PM By: Jonathan [name] Entered By: Jonathan [name] on 03/17/2020 14:27:52 -------------------------------------------------------------------------------- Lower Extremity Assessment Details Patient Name: Date of Service: Jonathan [name] 03/17/2020 1:00 PM Medical Record Number: [id number] Patient Account Number: [id number] Date of Birth/Sex: Treating RN: Dec 22, 1950 (69 y.o. Jonathan [name] Primary Care Jonathan [name]: Jonathan [name] Other Clinician: Referring Jonathan [name]: Treating Betzalel Umbarger/Extender: [place] Callas, Lyman Bishop [name] Weeks in Treatment: 6 Edema Assessment Assessed: [Left: No] [Right: No] Edema: [Left: N] [Right: o] Calf Left: Right: Point of Measurement: 41 cm From Medial Instep cm 35.5 cm Ankle Left: Right: Point of Measurement: 18 cm From Medial Instep cm 19.5 cm Vascular Assessment Pulses: Dorsalis Pedis Palpable: [Right:Yes] Electronic Signature(s) Signed: 03/20/2020 5:19:54 PM By: Jonathan Hurst RN, BSN Entered By: Jonathan [name] on 03/17/2020 13:25:49 -------------------------------------------------------------------------------- [place] Details Patient Name: Date of Service: Jonathan [name] 03/17/2020 1:00 PM Medical Record Number: [id number] Patient Account Number: [id number] Date of Birth/Sex: Treating RN: 1950-09-25 (68 y.o. Jonathan [name] Primary Care Jonathan [name]: Jonathan [name] Other Clinician: Referring Aliha Diedrich: Treating Buzz Axel/Extender: [place] Callas, Lyman Bishop [name] Weeks in Treatment: 6 Active Inactive Wound/Skin Impairment Nursing Diagnoses: Knowledge deficit  related to ulceration/compromised skin integrity Goals: Patient/caregiver will verbalize understanding of skin care regimen Date Initiated: 01/30/2020 Target Resolution Date: 04/16/2020 Goal Status: Active Ulcer/skin breakdown will have a volume reduction of 50% by week 8 Date Initiated: 01/30/2020 Date Inactivated: 02/18/2020 Target Resolution Date: 03/31/2020 Goal Status: Unmet Unmet Reason: comorbities Ulcer/skin breakdown will have a volume reduction of 80% by week 12 Date Initiated: 02/18/2020 Target Resolution Date: 03/20/2020 Goal Status: Active Interventions: Assess patient/caregiver ability to obtain necessary supplies Assess patient/caregiver ability to perform ulcer/skin care regimen  upon admission and as needed Provide education on ulcer and skin care Treatment Activities: Skin care regimen initiated : 01/30/2020 Topical wound management initiated : 01/30/2020 Notes: Electronic Signature(s) Signed: 03/17/2020 5:05:11 PM By: Jonathan Coria RN Entered By: Jonathan [name] on 03/17/2020 13:47:33 -------------------------------------------------------------------------------- Pain Assessment Details Patient Name: Date of Service: Jonathan [name] 03/17/2020 1:00 PM Medical Record Number: [id number] Patient Account Number: [id number] Date of Birth/Sex: Treating RN: 29-Nov-1950 (69 y.o. Jonathan [name] Primary Care Jalie Eiland: Jonathan [name] Other Clinician: Referring Jonathan [name]: Treating Jonathan [name]/Extender: Jonathan [name], Lyman Bishop [name] Weeks in Treatment: 6 Active Problems Location of Pain Severity and Description of Pain Patient Has Paino No Site Locations Pain Management and Medication Current Pain Management: Electronic Signature(s) Signed: 03/20/2020 5:19:54 PM By: Jonathan Hurst RN, BSN Entered By: Jonathan [name] on 03/17/2020 13:20:45 -------------------------------------------------------------------------------- Patient/Caregiver Education Details Patient Name: Date of  Service: Jonathan [name] 8/10/2021andnbsp1:00 PM Medical Record Number: [id number] Patient Account Number: [id number] Date of Birth/Gender: Treating RN: Jan 16, 1951 (68 y.o. Jonathan [name] Primary Care Physician: Jonathan [name] Other Clinician: Referring Physician: Treating Physician/Extender: [place] Callas, Elder Negus Weeks in Treatment: 6 Education Assessment Education Provided To: Patient Education Topics Provided Wound/Skin Impairment: Methods: Explain/Verbal Responses: State content correctly Electronic Signature(s) Signed: 03/17/2020 5:05:11 PM By: Jonathan Coria RN Entered By: Jonathan [name] on 03/17/2020 13:38:28 -------------------------------------------------------------------------------- Wound Assessment Details Patient Name: Date of Service: GUNNAR, HEREFORD 03/17/2020 1:00 PM Medical Record Number: [id number] Patient Account Number: [id number] Date of Birth/Sex: Treating RN: 11-21-1950 (69 y.o. Jonathan [name] Primary Care Angelee Bahr: Jonathan [name] Other Clinician: Referring Joelie Schou: Treating Gerrard Crystal/Extender: [place] Callas, Lyman Bishop [name] Weeks in Treatment: 6 Wound Status Wound Number: 1 Primary Diabetic Wound/Ulcer of the Lower Extremity Etiology: Wound Location: Right, Lateral Foot Wound Open Wounding Event: Gradually Appeared Status: Date Acquired: 12/26/2019 Comorbid Cataracts, Congestive Heart Failure, Hypertension, Myocardial Weeks Of Treatment: 6 History: Infarction, Hepatitis C, Type II Diabetes, End Stage Renal Clustered Wound: No Disease, Dementia, Neuropathy Photos Photo Uploaded By: Mikeal Hawthorne on 03/18/2020 09:27:18 Wound Measurements Length: (cm) 0.7 Width: (cm) 0.7 Depth: (cm) 0.3 Area: (cm) 0.385 Volume: (cm) 0.115 Wound Description Classification: Grade 2 Wound Margin: Well defined, not attached Exudate Amount: Small Exudate Type: Purulent Exudate Color: yellow, brown, green Foul Odor After Cleansing:  N Slough/Fibrino Y % Reduction in Area: -145.2% % Reduction in Volume: -82.5% Epithelialization: None Tunneling: No Undermining: Yes Starting Position (o'clock): 12 Ending Position (o'clock): 12 Maximum Distance: (cm) 0.4 o es Wound Bed Granulation Amount: Small (1-33%) Exposed Structure Granulation Quality: Pink, Pale Fascia Exposed: No Necrotic Amount: Large (67-100%) Fat Layer (Subcutaneous Tissue) Exposed: Yes Necrotic Quality: Eschar, Adherent Slough Tendon Exposed: No Muscle Exposed: No Joint Exposed: No Bone Exposed: No Treatment Notes Wound #1 (Right, Lateral Foot) 1. Cleanse With Wound Cleanser 2. Periwound Care Skin Prep 3. Primary Dressing Applied Hydrofera Blue 4. Secondary Dressing Foam Border Dressing Electronic Signature(s) Signed: 03/20/2020 5:19:54 PM By: Jonathan Hurst RN, BSN Entered By: Jonathan [name] on 03/17/2020 13:25:37 -------------------------------------------------------------------------------- [place] Details Patient Name: Date of Service: ARISTIDES, LUCKEY [name] 03/17/2020 1:00 PM Medical Record Number: [id number] Patient Account Number: [id number] Date of Birth/Sex: Treating RN: [birth year] (69 y.o. Jonathan [name] Primary Care Tiya Schrupp: Jonathan [name] Other Clinician: Referring Conall Vangorder: Treating Rhemi Balbach/Extender: [place] Callas, Lyman Bishop [name] Weeks in Treatment: 6 Vital Signs Time Taken: 13:20 Temperature (F): 98.8 Height (in): 71 Pulse (bpm): 91 Weight (lbs): 145 Respiratory Rate (breaths/min): 16 Body Mass Index (  BMI): 20.2 Blood Pressure (mmHg): 118/70 Reference Range: 80 - 120 mg / dl Electronic Signature(s) Signed: 03/20/2020 5:19:54 PM By: Jonathan Hurst RN, BSN Entered By: Jonathan [name] on 03/17/2020 13:20:40

## 2020-03-31 ENCOUNTER — Encounter (HOSPITAL_BASED_OUTPATIENT_CLINIC_OR_DEPARTMENT_OTHER): Payer: Medicare Other | Admitting: Internal Medicine

## 2020-04-07 ENCOUNTER — Encounter (HOSPITAL_BASED_OUTPATIENT_CLINIC_OR_DEPARTMENT_OTHER): Payer: Medicare Other | Admitting: Internal Medicine

## 2020-04-07 DIAGNOSIS — E11621 Type 2 diabetes mellitus with foot ulcer: Secondary | ICD-10-CM | POA: Diagnosis not present

## 2020-04-08 NOTE — Progress Notes (Signed)
ADDISON, WHIDBEE ([id number]) Visit Report for 04/07/2020 Arrival Information Details Patient Name: Date of Service: Jonathan [name], Jonathan [name] 04/07/2020 12:45 PM Medical Record Number: [id number] Patient Account Number: [id number] Date of Birth/Sex: Treating RN: 1951-07-31 (68 y.o. [name]) Carlene Coria Primary Care Jocee Kissick: Harvin Hazel M Other Clinician: Referring Panzy Bubeck: Treating Sachin Ferencz/Extender: Jarold Song, Elder Negus Weeks in Treatment: 9 Visit Information History Since Last Visit Added or deleted any medications: No Patient Arrived: Gilford Rile Any new allergies or adverse reactions: No Arrival Time: 12:57 Had a fall or experienced change in No Accompanied By: wife activities of daily living that may affect Transfer Assistance: None risk of falls: Patient Identification Verified: Yes Signs or symptoms of abuse/neglect since last visito No Secondary Verification Process Completed: Yes Hospitalized since last visit: No Patient Has Alerts: Yes Implantable device outside of the clinic excluding No Patient Alerts: Patient on Blood Thinner cellular tissue based products placed in the center Right ABI: non compress since last visit: Has Dressing in Place as Prescribed: Yes Pain Present Now: No Electronic Signature(s) Signed: 04/08/2020 11:00:36 AM By: Sandre Kitty Entered By: Sandre Kitty on 04/07/2020 12:57:51 -------------------------------------------------------------------------------- Encounter Discharge Information Details Patient Name: Date of Service: Jonathan [name], Jonathan [name] 04/07/2020 12:45 PM Medical Record Number: [id number] Patient Account Number: [id number] Date of Birth/Sex: Treating RN: 1951-05-05 (69 y.o. [name] Primary Care Nyeli Holtmeyer: Harvin Hazel M Other Clinician: Referring Wildon Cuevas: Treating Markesha Hannig/Extender: Jarold Song, Lyman Bishop M Weeks in Treatment: 9 Encounter Discharge Information Items Post Procedure Vitals Discharge Condition:  Stable Temperature (F): 97.7 Ambulatory Status: Wheelchair Pulse (bpm): 90 Discharge Destination: Home Respiratory Rate (breaths/min): 18 Transportation: Private Auto Blood Pressure (mmHg): 122/78 Accompanied By: mother Schedule Follow-up Appointment: Yes Clinical Summary of Care: Patient Declined Electronic Signature(s) Signed: 04/07/2020 4:57:23 PM By: Baruch Gouty RN, BSN Entered By: Baruch Gouty on 04/07/2020 13:43:21 -------------------------------------------------------------------------------- Lower Extremity Assessment Details Patient Name: Date of Service: Jonathan [name], Jonathan [name] 04/07/2020 12:45 PM Medical Record Number: [id number] Patient Account Number: [id number] Date of Birth/Sex: Treating RN: 24-Feb-1951 (70 y.o. [name] Primary Care Lova Urbieta: Harvin Hazel M Other Clinician: Referring Reshad Saab: Treating Kamila Broda/Extender: Jarold Song, Lyman Bishop M Weeks in Treatment: 9 Edema Assessment Assessed: Shirlyn Goltz: No] Patrice Paradise: No] Edema: [Left: N] [Right: o] Calf Left: Right: Point of Measurement: 41 cm From Medial Instep cm 35.5 cm Ankle Left: Right: Point of Measurement: 18 cm From Medial Instep cm 19.5 cm Vascular Assessment Pulses: Dorsalis Pedis Palpable: [Right:Yes] Electronic Signature(s) Signed: 04/08/2020 11:03:23 AM By: Levan Hurst RN, BSN Entered By: Levan Hurst on 04/07/2020 13:09:59 -------------------------------------------------------------------------------- Multi Wound Chart Details Patient Name: Date of Service: Jonathan [name], Jonathan [name] 04/07/2020 12:45 PM Medical Record Number: [id number] Patient Account Number: [id number] Date of Birth/Sex: Treating RN: 1950/08/23 (68 y.o. [name]) Carlene Coria Primary Care Sharell Hilmer: Harvin Hazel M Other Clinician: Referring Jonus Coble: Treating Paulla Mcclaskey/Extender: Jarold Song, Lyman Bishop M Weeks in Treatment: 9 Vital Signs Height(in): 71 Pulse(bpm): 90 Weight(lbs): 145 Blood Pressure(mmHg):  122/78 Body Mass Index(BMI): 20 Temperature(F): 97.7 Respiratory Rate(breaths/min): 16 Photos: [1:No Photos Right, Lateral Foot] [N/A:N/A N/A] Wound Location: [1:Gradually Appeared] [N/A:N/A] Wounding Event: [1:Diabetic Wound/Ulcer of the Lower] [N/A:N/A] Primary Etiology: [1:Extremity Cataracts, Congestive Heart Failure,] [N/A:N/A] Comorbid History: [1:Hypertension, Myocardial Infarction, Hepatitis C, Type II Diabetes, End Stage Renal Disease, Dementia, Neuropathy 12/26/2019] [N/A:N/A] Date Acquired: [1:9] [N/A:N/A] Weeks of Treatment: [1:Open] [N/A:N/A] Wound Status: [1:0.3x0.3x0.2] [N/A:N/A] Measurements L x W x D (cm) [1:0.071] [N/A:N/A] A (cm) : rea [1:0.014] [N/A:N/A] Volume (cm) : [1:54.80%] [N/A:N/A] %  Reduction in A rea: [1:77.80%] [N/A:N/A] % Reduction in Volume: [1:Grade 2] [N/A:N/A] Classification: [1:Small] [N/A:N/A] Exudate A mount: [1:Serous] [N/A:N/A] Exudate Type: [1:amber] [N/A:N/A] Exudate Color: [1:Well defined, not attached] [N/A:N/A] Wound Margin: [1:Small (1-33%)] [N/A:N/A] Granulation A mount: [1:Pink, Pale] [N/A:N/A] Granulation Quality: [1:Large (67-100%)] [N/A:N/A] Necrotic A mount: [1:Fat Layer (Subcutaneous Tissue): Yes N/A] Exposed Structures: [1:Fascia: No Tendon: No Muscle: No Joint: No Bone: No Small (1-33%)] [N/A:N/A] Treatment Notes Electronic Signature(s) Signed: 04/07/2020 5:08:08 PM By: Linton Ham MD Signed: 04/08/2020 10:37:46 AM By: Carlene Coria RN Entered By: Linton Ham on 04/07/2020 13:29:49 -------------------------------------------------------------------------------- Multi-Disciplinary Care Plan Details Patient Name: Date of Service: Jonathan [name], Jonathan [name] 04/07/2020 12:45 PM Medical Record Number: [id number] Patient Account Number: [id number] Date of Birth/Sex: Treating RN: 16-Dec-1950 (68 y.o. [name] Primary Care Leeanna Slaby: Harvin Hazel M Other Clinician: Referring Markel Mergenthaler: Treating Hollyanne Schloesser/Extender: Jarold Song, Lyman Bishop M Weeks in Treatment: 9 Active Inactive Wound/Skin Impairment Nursing Diagnoses: Knowledge deficit related to ulceration/compromised skin integrity Goals: Patient/caregiver will verbalize understanding of skin care regimen Date Initiated: 01/30/2020 Target Resolution Date: 04/16/2020 Goal Status: Active Ulcer/skin breakdown will have a volume reduction of 50% by week 8 Date Initiated: 01/30/2020 Date Inactivated: 02/18/2020 Target Resolution Date: 03/31/2020 Goal Status: Unmet Unmet Reason: comorbities Ulcer/skin breakdown will have a volume reduction of 80% by week 12 Date Initiated: 02/18/2020 Date Inactivated: 04/07/2020 Target Resolution Date: 03/20/2020 Goal Status: Unmet Unmet Reason: comorbities Ulcer/skin breakdown will heal within 14 weeks Date Initiated: 04/07/2020 Target Resolution Date: 05/07/2020 Goal Status: Active Interventions: Assess patient/caregiver ability to obtain necessary supplies Assess patient/caregiver ability to perform ulcer/skin care regimen upon admission and as needed Provide education on ulcer and skin care Treatment Activities: Skin care regimen initiated : 01/30/2020 Topical wound management initiated : 01/30/2020 Notes: Electronic Signature(s) Signed: 04/08/2020 10:37:46 AM By: Carlene Coria RN Entered By: Carlene Coria on 04/07/2020 13:26:16 -------------------------------------------------------------------------------- Pain Assessment Details Patient Name: Date of Service: Jonathan [name], Jonathan [name] 04/07/2020 12:45 PM Medical Record Number: [id number] Patient Account Number: [id number] Date of Birth/Sex: Treating RN: January 12, 1951 (68 y.o. [name] Primary Care Malaiah Viramontes: Harvin Hazel M Other Clinician: Referring Myishia Kasik: Treating Ociel Retherford/Extender: Jarold Song, Lyman Bishop M Weeks in Treatment: 9 Active Problems Location of Pain Severity and Description of Pain Patient Has Paino No Site Locations Pain Management and  Medication Current Pain Management: Electronic Signature(s) Signed: 04/08/2020 10:37:46 AM By: Carlene Coria RN Signed: 04/08/2020 11:00:36 AM By: Sandre Kitty Entered By: Sandre Kitty on 04/07/2020 12:58:16 -------------------------------------------------------------------------------- Patient/Caregiver Education Details Patient Name: Date of Service: Jonathan [name], Jonathan [name] 8/31/2021andnbsp12:45 PM Medical Record Number: [id number] Patient Account Number: [id number] Date of Birth/Gender: Treating RN: 1950-10-05 (68 y.o. [name] Primary Care Physician: Harvin Hazel M Other Clinician: Referring Physician: Treating Physician/Extender: Jarold Song, Elder Negus Weeks in Treatment: 9 Education Assessment Education Provided To: Patient Education Topics Provided Wound/Skin Impairment: Methods: Explain/Verbal Responses: State content correctly Electronic Signature(s) Signed: 04/08/2020 10:37:46 AM By: Carlene Coria RN Entered By: Carlene Coria on 04/07/2020 13:27:36 -------------------------------------------------------------------------------- Wound Assessment Details Patient Name: Date of Service: Jonathan [name], Jonathan [name] 04/07/2020 12:45 PM Medical Record Number: [id number] Patient Account Number: [id number] Date of Birth/Sex: Treating RN: 05-Jun-1951 (68 y.o. [name] Primary Care Gurshan Settlemire: Harvin Hazel M Other Clinician: Referring Seneca Gadbois: Treating Elmyra Banwart/Extender: Jarold Song, Lyman Bishop M Weeks in Treatment: 9 Wound Status Wound Number: 1 Primary Diabetic Wound/Ulcer of the Lower Extremity Etiology: Wound Location: Right, Lateral Foot Wound Open Wounding Event: Gradually Appeared Status:  Date Acquired: 12/26/2019 Comorbid Cataracts, Congestive Heart Failure, Hypertension, Myocardial Weeks Of Treatment: 9 History: Infarction, Hepatitis C, Type II Diabetes, End Stage Renal Disease, Clustered Wound: No Dementia, Neuropathy Wound Measurements Length:  (cm) 0.3 Width: (cm) 0.3 Depth: (cm) 0.2 Area: (cm) 0.071 Volume: (cm) 0.014 % Reduction in Area: 54.8% % Reduction in Volume: 77.8% Epithelialization: Small (1-33%) Tunneling: No Undermining: No Wound Description Classification: Grade 2 Wound Margin: Well defined, not attached Exudate Amount: Small Exudate Type: Serous Exudate Color: amber Foul Odor After Cleansing: No Slough/Fibrino Yes Wound Bed Granulation Amount: Small (1-33%) Exposed Structure Granulation Quality: Pink, Pale Fascia Exposed: No Necrotic Amount: Large (67-100%) Fat Layer (Subcutaneous Tissue) Exposed: Yes Necrotic Quality: Adherent Slough Tendon Exposed: No Muscle Exposed: No Joint Exposed: No Bone Exposed: No Treatment Notes Wound #1 (Right, Lateral Foot) 2. Periwound Care Skin Prep 3. Primary Dressing Applied Iodoflex 4. Secondary Dressing Foam Border Dressing Electronic Signature(s) Signed: 04/08/2020 10:37:46 AM By: Carlene Coria RN Signed: 04/08/2020 11:03:23 AM By: Levan Hurst RN, BSN Entered By: Levan Hurst on 04/07/2020 13:10:14 -------------------------------------------------------------------------------- Vitals Details Patient Name: Date of Service: Jonathan [name], Jonathan [name] 04/07/2020 12:45 PM Medical Record Number: [id number] Patient Account Number: [id number] Date of Birth/Sex: Treating RN: 03/24/1951 (68 y.o. [name] Acosta, [place] Primary Care Beaux Wedemeyer: Harvin Hazel M Other Clinician: Referring Nolia Tschantz: Treating Jaylene Arrowood/Extender: Jarold Song, Lyman Bishop M Weeks in Treatment: 9 Vital Signs Time Taken: 12:57 Temperature (F): 97.7 Height (in): 71 Pulse (bpm): 90 Weight (lbs): 145 Respiratory Rate (breaths/min): 16 Body Mass Index (BMI): 20.2 Blood Pressure (mmHg): 122/78 Reference Range: 80 - 120 mg / dl Electronic Signature(s) Signed: 04/08/2020 11:00:36 AM By: Sandre Kitty Entered By: Sandre Kitty on 04/07/2020 12:58:08

## 2020-04-08 NOTE — Progress Notes (Signed)
Jonathan, [name] ([id number]) Visit Report for 04/07/2020 Debridement Details Patient Name: Date of Service: Jonathan, [name] 04/07/2020 12:45 PM Medical Record Number: [id number] Patient Account Number: [id number] Date of Birth/Sex: Treating RN: [birth year] (68 y.o. [name]) Carlene Coria Primary Care Provider: Harvin Hazel M Other Clinician: Referring Provider: Treating Provider/Extender: Jarold Song, Lyman Bishop M Weeks in Treatment: 9 Debridement Performed for Assessment: Wound #1 Right,Lateral Foot Performed By: Physician Ricard Dillon., MD Debridement Type: Debridement Severity of Tissue Pre Debridement: Fat layer exposed Level of Consciousness (Pre-procedure): Awake and Alert Pre-procedure Verification/Time Out Yes - 13:26 Taken: Start Time: 13:26 Pain Control: Lidocaine 5% topical ointment T Area Debrided (L x W): otal 0.3 (cm) x 0.3 (cm) = 0.09 (cm) Tissue and other material debrided: Viable, Non-Viable, Slough, Subcutaneous, Skin: Dermis , Skin: Epidermis, Slough Level: Skin/Subcutaneous Tissue Debridement Description: Excisional Instrument: Curette Bleeding: Minimum Hemostasis Achieved: Pressure End Time: 13:29 Procedural Pain: 0 Post Procedural Pain: 0 Response to Treatment: Procedure was tolerated well Level of Consciousness (Post- Awake and Alert procedure): Post Debridement Measurements of Total Wound Length: (cm) 0.3 Width: (cm) 0.3 Depth: (cm) 0.2 Volume: (cm) 0.014 Character of Wound/Ulcer Post Debridement: Improved Severity of Tissue Post Debridement: Fat layer exposed Post Procedure Diagnosis Same as Pre-procedure Electronic Signature(s) Signed: 04/07/2020 5:08:08 PM By: Linton Ham MD Signed: 04/08/2020 10:37:46 AM By: Carlene Coria RN Entered By: Carlene Coria on 04/07/2020 13:30:01 -------------------------------------------------------------------------------- HPI Details Patient Name: Date of Service: Jonathan, TURGEON [name] 04/07/2020 12:45 PM Medical  Record Number: [id number] Patient Account Number: [id number] Date of Birth/Sex: Treating RN: 09-12-1950 (68 y.o. [name] Primary Care Provider: Harvin Hazel M Other Clinician: Referring Provider: Treating Provider/Extender: Jarold Song, Lyman Bishop M Weeks in Treatment: 9 History of Present Illness HPI Description: ADMISSION 01/30/2020 This is a 69 year old man who was hospitalized with Covid for a month in January 2021. Subsequently he was discharged to [facility] skilled facility for rehabilitation. His wife states that when he got home he had areas on the right lateral heel, right lateral fifth metatarsal head. They have been using Santyl to the right lateral heel no dressing on the right lateral foot. Our intake nurse discovered in small area on the right lateral great toe. He is minimally ambulatory at this point. He does not have a known history of PAD although he is a diabetic. 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 Still a small open area on the medial ffirth metatarsal head. But this is also better. He has not heard anything about his arterial studies that we ordered 7/13; patient's review with vein and vascular/noninvasive testing is not till 8/3. Still an open area on the right lateral first met head. The area on the right lateral heel remains closed although it is callused 7/27; arterial studies on 8 3. Still has the open area on the right fifth metatarsal head. We have been using  Hydrofera Blue 8/10-Comes a 2 weeks, had his vascular studies done, ABIs on both sides are reasonably good, TBI on the right is 145 Continuing with  Hydrofera Blue, the wound is slightly larger than last time. 8/31; months since I last saw this man. We have been using Hydrofera Blue to the small remaining area on the right fifth metatarsal head. He did have arterial studies done on 8/3 and while these were not terrible they certainly were normal. He is noncompressible on the right but with triphasic waveforms and a TBI of 0.96 on the left he is noncompressible with monophasic waveforms at the PTA triphasic at the dorsalis pedis with a TBI of 0.59 Electronic Signature(s) Signed: 04/07/2020 5:08:08 PM By: Linton Ham MD Entered By: Linton Ham on 04/07/2020 13:31:31 -------------------------------------------------------------------------------- Physical Exam Details Patient Name: Date of Service: Jonathan, [name] 04/07/2020 12:45 PM Medical Record Number: [id number] Patient Account Number: [id number] Date of Birth/Sex: Treating RN: April 21, 1951 (68 y.o. [name] Primary Care Provider: Harvin Hazel M Other Clinician: Referring Provider: Treating Provider/Extender: Jarold Song, Lyman Bishop M Weeks in Treatment: 9 Constitutional Sitting or standing Blood Pressure is within target range for patient.. Pulse regular and within target range for patient.Marland Kitchen Respirations regular, non-labored and within target range.. Temperature is normal and within the target range for the patient.Marland Kitchen Appears in no distress. Notes Wound exam; right fifth met head on the lateral aspect. This has very adherent fibrinous debris on the small wound surface. I used a #3 curette and remove this with some difficulty. Hemostasis with direct pressure Electronic Signature(s) Signed: 04/07/2020 5:08:08 PM By: Linton Ham MD Entered By: Linton Ham on 04/07/2020  13:32:44 -------------------------------------------------------------------------------- Physician Orders Details Patient Name: Date of Service: Jonathan, ABEE [name] 04/07/2020 12:45 PM Medical Record Number: [id number] Patient Account Number: [id number] Date of Birth/Sex: Treating RN: 1950-10-03 (68 y.o. [name]) Carlene Coria Primary Care Provider: Harvin Hazel M Other Clinician: Referring Provider: Treating Provider/Extender: Jarold Song, Elder Negus Weeks in Treatment: 9 Verbal / Phone Orders: No Diagnosis Coding ICD-10 Coding Code Description E11.621 Type 2 diabetes mellitus with foot ulcer L97.512 Non-pressure chronic ulcer of other part of right foot with fat layer exposed L97.411 Non-pressure chronic ulcer of right heel and midfoot limited to breakdown of skin E11.51 Type 2 diabetes mellitus with diabetic peripheral angiopathy without gangrene Follow-up Appointments Return Appointment in 2 weeks. Dressing Change Frequency Wound #1 Right,Lateral Foot Change Dressing every other day. Wound Cleansing Wound #1 Right,Lateral Foot May shower with protection. - use a cast protector on all other days not changing the dressings. May shower and wash wound with soap and water. - with dressing changes. Primary Wound Dressing Wound #1 Right,Lateral Foot Iodoflex Secondary Dressing Wound #1 Right,Lateral Foot Foam Border - or gauze and kerlix Off-Loading Open toe surgical shoe to: - right foot Other: - float right heel with pillows to offload pressure to heel while resting in chair and bed. Electronic Signature(s) Signed: 04/07/2020 5:08:08 PM By: Linton Ham MD Signed: 04/08/2020 10:37:46 AM By: Carlene Coria RN Entered By: Carlene Coria on 04/07/2020 13:28:39 -------------------------------------------------------------------------------- Problem List Details Patient Name: Date of Service: AZLAN, HANWAY 04/07/2020 12:45 PM Medical Record Number: [id number] Patient Account Number:  [id number] Date of Birth/Sex: Treating RN: 1951-03-09 (68 y.o. [name] Primary Care Provider: Marykay Lex Other Clinician: Referring Provider: Treating Provider/Extender: Jarold Song, Lyman Bishop M Weeks in Treatment: 9 Active Problems ICD-10 Encounter Code Description Active Date MDM Diagnosis E11.621 Type 2 diabetes mellitus with foot ulcer 01/30/2020 No Yes L97.512 Non-pressure chronic ulcer of other part of right foot with fat layer exposed 01/30/2020 No Yes  L97.411 Non-pressure chronic ulcer of right heel and midfoot limited to breakdown of 01/30/2020 No Yes skin E11.51 Type 2 diabetes mellitus with diabetic peripheral angiopathy without gangrene 01/30/2020 No Yes Inactive Problems Resolved Problems Electronic Signature(s) Signed: 04/07/2020 5:08:08 PM By: Linton Ham MD Entered By: Linton Ham on 04/07/2020 13:29:41 -------------------------------------------------------------------------------- Progress Note Details Patient Name: Date of Service: Jonathan, BREACH [name] 04/07/2020 12:45 PM Medical Record Number: [id number] Patient Account Number: [id number] Date of Birth/Sex: Treating RN: 04-28-1951 (68 y.o. [name] Primary Care Provider: Harvin Hazel M Other Clinician: Referring Provider: Treating Provider/Extender: Jarold Song, Lyman Bishop M Weeks in Treatment: 9 Subjective History of Present Illness (HPI) ADMISSION 01/30/2020 This is a 69 year old man who was hospitalized with Covid for a month in January 2021. Subsequently he was discharged to [facility] skilled facility for rehabilitation. His wife states that when he got home he had areas on the right lateral heel, right lateral fifth metatarsal head. They have been using Santyl to the right lateral heel no dressing on the right lateral foot. Our intake nurse discovered in small area on the right lateral great toe. He is minimally ambulatory at this point. He does not have a known history of  PAD although he is a diabetic. 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 Still a small open area on the medial ffirth metatarsal head. But this is also better. He has not heard anything about his arterial studies that we ordered 7/13; patient's review with vein and vascular/noninvasive testing is not till 8/3. Still an open area on the right lateral first met head. The area on the right lateral heel remains closed although it is callused 7/27; arterial studies on 8 3. Still has the open area on the right fifth metatarsal head. We have been using Hydrofera Blue 8/10-Comes a 2 weeks, had his vascular studies done, ABIs on both sides are reasonably good, TBI on the right is 145 Continuing with Hydrofera Blue, the wound is slightly larger than last time. 8/31; months since I last saw this man. We have been using Hydrofera Blue to the small remaining area on the right fifth metatarsal head. He did have arterial studies done on 8/3 and while these were not terrible they certainly were normal. He is noncompressible on the right but with triphasic waveforms and a TBI of 0.96 on the left he is noncompressible with monophasic waveforms at the PTA triphasic at the dorsalis pedis with a TBI of 0.59 Objective Constitutional Sitting or standing Blood Pressure is within target range for patient.. Pulse regular and within target range for patient.Marland Kitchen Respirations regular, non-labored and within target range.. Temperature is normal  and within the target range for the patient.Marland Kitchen Appears in no distress. Vitals Time Taken: 12:57 PM, Height: 71 in, Weight: 145 lbs, BMI: 20.2, Temperature: 97.7 F, Pulse: 90 bpm, Respiratory Rate: 16 breaths/min, Blood Pressure: 122/78 mmHg. General Notes: Wound exam; right fifth met head on the lateral aspect. This has very adherent fibrinous debris on the small wound surface. I used a #3 curette and  remove this with some difficulty. Hemostasis with direct pressure Integumentary (Hair, Skin) Wound #1 status is Open. Original cause of wound was Gradually Appeared. The wound is located on the Right,Lateral Foot. The wound measures 0.3cm length x 0.3cm width x 0.2cm depth; 0.071cm^2 area and 0.014cm^3 volume. There is Fat Layer (Subcutaneous Tissue) exposed. There is no tunneling or undermining noted. There is a small amount of serous drainage noted. The wound margin is well defined and not attached to the wound base. There is small (1-33%) pink, pale granulation within the wound bed. There is a large (67-100%) amount of necrotic tissue within the wound bed including Adherent Slough. 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 There was a Excisional Skin/Subcutaneous Tissue Debridement with a total area of 0.09 sq cm performed by Ricard Dillon., MD. With the following instrument(s): Curette to remove Viable and Non-Viable tissue/material. Material removed includes Subcutaneous Tissue, Slough, Skin: Dermis, and Skin: Epidermis after  achieving pain control using Lidocaine 5% topical ointment. No specimens were taken. A time out was conducted at 13:26, prior to the start of the procedure. A Minimum amount of bleeding was controlled with Pressure. The procedure was tolerated well with a pain level of 0 throughout and a pain level of 0 following the procedure. Post Debridement Measurements: 0.3cm length x 0.3cm width x 0.2cm depth; 0.014cm^3 volume. Character of Wound/Ulcer Post Debridement is improved. Severity of Tissue Post Debridement is: Fat layer exposed. Post procedure Diagnosis Wound #1: Same as Pre-Procedure Plan Follow-up Appointments: Return Appointment in 2 weeks. Dressing Change Frequency: Wound #1 Right,Lateral Foot: Change Dressing every other day. Wound Cleansing: Wound #1 Right,Lateral Foot: May shower with protection. - use a cast protector on all other days not changing the dressings. May shower and wash wound with soap and water. - with dressing changes. Primary Wound Dressing: Wound #1 Right,Lateral Foot: Iodoflex Secondary Dressing: Wound #1 Right,Lateral Foot: Foam Border - or gauze and kerlix Off-Loading: Open toe surgical shoe to: - right foot Other: - float right heel with pillows to offload pressure to heel while resting in chair and bed. 1. I change the primary dressing to Iodoflex hopefully will give [place] a better wound surface 2. May need to consider an advanced treatment product Electronic Signature(s) Signed: 04/07/2020 5:08:08 PM By: Linton Ham MD Entered By: Linton Ham on 04/07/2020 13:33:30 -------------------------------------------------------------------------------- SuperBill Details Patient Name: Date of Service: Jonathan, ROMIG [name] 04/07/2020 Medical Record Number: [id number] Patient Account Number: [id number] Date of Birth/Sex: Treating RN: 1951-07-25 (68 y.o. [name] Primary Care Provider: Harvin Hazel M Other Clinician: Referring Provider: Treating  Provider/Extender: Jarold Song, Lyman Bishop M Weeks in Treatment: 9 Diagnosis Coding ICD-10 Codes Code Description E11.621 Type 2 diabetes mellitus with foot ulcer L97.512 Non-pressure chronic ulcer of other part of right foot with fat layer exposed L97.411 Non-pressure chronic ulcer of right heel and midfoot limited to breakdown of skin E11.51 Type 2 diabetes mellitus with diabetic peripheral angiopathy without gangrene Facility Procedures CPT4 Code: [id number] Description: 29924 - DEB SUBQ TISSUE 20 SQ CM/< ICD-10 Diagnosis Description L97.512 Non-pressure chronic  ulcer of other part of right foot with fat layer exposed Modifier: Quantity: 1 Physician Procedures : CPT4 Code Description Modifier [id number] - WC PHYS SUBQ TISS 20 SQ CM ICD-10 Diagnosis Description L97.512 Non-pressure chronic ulcer of other part of right foot with fat layer exposed Quantity: 1 Electronic Signature(s) Signed: 04/07/2020 5:08:08 PM By: Linton Ham MD Entered By: Linton Ham on 04/07/2020 13:33:58

## 2020-04-21 ENCOUNTER — Encounter (HOSPITAL_BASED_OUTPATIENT_CLINIC_OR_DEPARTMENT_OTHER): Payer: Medicare Other | Attending: Internal Medicine | Admitting: Internal Medicine

## 2020-04-21 DIAGNOSIS — E1122 Type 2 diabetes mellitus with diabetic chronic kidney disease: Secondary | ICD-10-CM | POA: Diagnosis not present

## 2020-04-21 DIAGNOSIS — Z992 Dependence on renal dialysis: Secondary | ICD-10-CM | POA: Diagnosis not present

## 2020-04-21 DIAGNOSIS — I252 Old myocardial infarction: Secondary | ICD-10-CM | POA: Diagnosis not present

## 2020-04-21 DIAGNOSIS — Z8616 Personal history of COVID-19: Secondary | ICD-10-CM | POA: Diagnosis not present

## 2020-04-21 DIAGNOSIS — E1140 Type 2 diabetes mellitus with diabetic neuropathy, unspecified: Secondary | ICD-10-CM | POA: Diagnosis not present

## 2020-04-21 DIAGNOSIS — B1920 Unspecified viral hepatitis C without hepatic coma: Secondary | ICD-10-CM | POA: Insufficient documentation

## 2020-04-21 DIAGNOSIS — I5020 Unspecified systolic (congestive) heart failure: Secondary | ICD-10-CM | POA: Diagnosis not present

## 2020-04-21 DIAGNOSIS — I2510 Atherosclerotic heart disease of native coronary artery without angina pectoris: Secondary | ICD-10-CM | POA: Diagnosis not present

## 2020-04-21 DIAGNOSIS — N186 End stage renal disease: Secondary | ICD-10-CM | POA: Insufficient documentation

## 2020-04-21 DIAGNOSIS — I132 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 kidney disease, or end stage renal disease: Secondary | ICD-10-CM | POA: Diagnosis not present

## 2020-04-21 DIAGNOSIS — F0150 Vascular dementia without behavioral disturbance: Secondary | ICD-10-CM | POA: Insufficient documentation

## 2020-04-21 DIAGNOSIS — L97512 Non-pressure chronic ulcer of other part of right foot with fat layer exposed: Secondary | ICD-10-CM | POA: Diagnosis not present

## 2020-04-21 DIAGNOSIS — E1136 Type 2 diabetes mellitus with diabetic cataract: Secondary | ICD-10-CM | POA: Diagnosis not present

## 2020-04-21 DIAGNOSIS — E11621 Type 2 diabetes mellitus with foot ulcer: Secondary | ICD-10-CM | POA: Diagnosis not present

## 2020-04-21 DIAGNOSIS — L97411 Non-pressure chronic ulcer of right heel and midfoot limited to breakdown of skin: Secondary | ICD-10-CM | POA: Diagnosis not present

## 2020-04-21 DIAGNOSIS — Z940 Kidney transplant status: Secondary | ICD-10-CM | POA: Diagnosis not present

## 2020-04-21 DIAGNOSIS — T8611 Kidney transplant rejection: Secondary | ICD-10-CM | POA: Insufficient documentation

## 2020-04-21 DIAGNOSIS — E1151 Type 2 diabetes mellitus with diabetic peripheral angiopathy without gangrene: Secondary | ICD-10-CM | POA: Diagnosis present

## 2020-04-27 NOTE — Progress Notes (Signed)
Jonathan [name] ([id number]) Visit Report for 04/21/2020 Debridement Details Patient Name: Date of Service: Jonathan [name], Jonathan [name] 04/21/2020 12:45 PM Medical Record Number: [id number] Patient Account Number: [id number] Date of Birth/Sex: Treating RN: 09-02-1950 (68 y.o. Jonathan [name]) Carlene Coria Primary Care Provider: Harvin Hazel M Other Clinician: Referring Provider: Treating Provider/Extender: Jarold Song, Lyman Bishop M Weeks in Treatment: 11 Debridement Performed for Assessment: Wound #1 Right,Lateral Foot Performed By: Physician Ricard Dillon., MD Debridement Type: Debridement Severity of Tissue Pre Debridement: Fat layer exposed Level of Consciousness (Pre-procedure): Awake and Alert Pre-procedure Verification/Time Out Yes - 13:52 Taken: Start Time: 13:52 Pain Control: Lidocaine 5% topical ointment T Area Debrided (L x W): otal 0.8 (cm) x 0.6 (cm) = 0.48 (cm) Tissue and other material debrided: Viable, Non-Viable, Callus, Slough, Subcutaneous, Skin: Dermis , Skin: Epidermis, Slough Level: Skin/Subcutaneous Tissue Debridement Description: Excisional Instrument: Curette Bleeding: Moderate Hemostasis Achieved: Pressure End Time: 13:55 Procedural Pain: 2 Post Procedural Pain: 0 Response to Treatment: Procedure was tolerated well Level of Consciousness (Post- Awake and Alert procedure): Post Debridement Measurements of Total Wound Length: (cm) 1 Width: (cm) 1 Depth: (cm) 0.3 Volume: (cm) 0.236 Character of Wound/Ulcer Post Debridement: Improved Severity of Tissue Post Debridement: Fat layer exposed Post Procedure Diagnosis Same as Pre-procedure Electronic Signature(s) Signed: 04/21/2020 5:12:45 PM By: Linton Ham MD Signed: 04/27/2020 1:15:48 PM By: Carlene Coria RN Entered By: Linton Ham on 04/21/2020 15:32:59 -------------------------------------------------------------------------------- HPI Details Patient Name: Date of Service: Jonathan [name] 04/21/2020 12:45  PM Medical Record Number: [id number] Patient Account Number: [id number] Date of Birth/Sex: Treating RN: 30-May-1951 (68 y.o. Jonathan [name] Primary Care Provider: Harvin Hazel M Other Clinician: Referring Provider: Treating Provider/Extender: Jarold Song, Lyman Bishop M Weeks in Treatment: 11 History of Present Illness HPI Description: ADMISSION 01/30/2020 This is a 69 year old man who was hospitalized with Covid for a month in January 2021. Subsequently he was discharged to [facility] skilled facility for rehabilitation. His wife states that when he got home he had areas on the right lateral heel, right lateral fifth metatarsal head. They have been using Santyl to the right lateral heel no dressing on the right lateral foot. Our intake nurse discovered in small area on the right lateral great toe. He is minimally ambulatory at this point. He does not have a known history of PAD although he is a diabetic. 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 Still a small open area on the medial ffirth metatarsal head. But this is also better. He has not heard anything about his arterial studies that we ordered 7/13; patient's review with vein and vascular/noninvasive testing is not till 8/3. Still an open area on the right lateral first met head. The area on the right lateral heel remains closed although it is callused 7/27; arterial studies on 8 3. Still has the open area on the right fifth metatarsal head. We have  been using Hydrofera Blue 8/10-Comes a 2 weeks, had his vascular studies done, ABIs on both sides are reasonably good, TBI on the right is 145 Continuing  with Hydrofera Blue, the wound is slightly larger than last time. 8/31; months since I last saw this man. We have been using Hydrofera Blue to the small remaining area on the right fifth metatarsal head. He did have arterial studies done on 8/3 and while these were not terrible they certainly were normal. He is noncompressible on the right but with triphasic waveforms and a TBI of 0.96 on the left he is noncompressible with monophasic waveforms at the PTA triphasic at the dorsalis pedis with a TBI of 0.59 9/14 this patient still has a punched-out area on the right fifth metatarsal head lateral aspect. This is very painful. I looked back on his arterial studies his ABIs were noncompressible but with triphasic waveforms and a TBI of 0.96. We have been using Iodoflex Electronic Signature(s) Signed: 04/21/2020 5:12:45 PM By: Linton Ham MD Entered By: Linton Ham on 04/21/2020 15:35:03 -------------------------------------------------------------------------------- Physical Exam Details Patient Name: Date of Service: Jonathan [name] 04/21/2020 12:45 PM Medical Record Number: [id number] Patient Account Number: [id number] Date of Birth/Sex: Treating RN: 1951-03-21 (68 y.o. Jonathan [name] Primary Care Provider: Harvin Hazel M Other Clinician: Referring Provider: Treating Provider/Extender: Jarold Song, Lyman Bishop M Weeks in Treatment: 11 Notes Wound exam; right fifth met head on the lateral aspect. Again a completely nonviable surface with callus, loose overhanging skin and debris on the wound surface. It is very difficult to debride this wound because of discomfort nevertheless I did get something done here. Hemostasis with direct pressure although there was minimal bleeding Electronic Signature(s) Signed: 04/21/2020 5:12:45 PM By:  Linton Ham MD Entered By: Linton Ham on 04/21/2020 15:35:56 -------------------------------------------------------------------------------- Physician Orders Details Patient Name: Date of Service: TANNOR, PYON [name] 04/21/2020 12:45 PM Medical Record Number: [id number] Patient Account Number: [id number] Date of Birth/Sex: Treating RN: February 05, 1951 (68 y.o. Jonathan [name] Primary Care Provider: Harvin Hazel M Other Clinician: Referring Provider: Treating Provider/Extender: Jarold Song, Elder Negus Weeks in Treatment: 11 Verbal / Phone Orders: No Diagnosis Coding ICD-10 Coding Code Description E11.621 Type 2 diabetes mellitus with foot ulcer L97.512 Non-pressure chronic ulcer of other part of right foot with fat layer exposed L97.411 Non-pressure chronic ulcer of right heel and midfoot limited to breakdown of skin E11.51 Type 2 diabetes mellitus with diabetic peripheral angiopathy without gangrene Follow-up Appointments Return Appointment in 2 weeks. Dressing Change Frequency Wound #1 Right,Lateral Foot Change Dressing every other day. Wound Cleansing Wound #1 Right,Lateral Foot May shower with protection. - use a cast protector on all other days not changing the dressings. May shower and wash wound with soap and water. - with dressing changes. Primary Wound Dressing Wound #1 Right,Lateral Foot Iodoflex Secondary Dressing Wound #1 Right,Lateral Foot Foam Border - or gauze and kerlix Off-Loading Open toe surgical shoe to: - right foot Other: - float right heel with pillows to offload pressure to heel while resting in chair and bed. Electronic Signature(s) Signed: 04/21/2020 5:12:45 PM By: Linton Ham MD Signed: 04/27/2020 1:15:48 PM By: Carlene Coria RN Entered By: Carlene Coria on 04/21/2020 13:27:12 -------------------------------------------------------------------------------- Problem List Details Patient Name: Date of Service: NORI, POLAND [name] 04/21/2020 12:45  PM Medical Record Number: [id number] Patient Account Number: [id number] Date of Birth/Sex: Treating RN: Dec 29, 1950 (68 y.o. Jonathan [name] Primary Care Provider: Marykay Lex Other Clinician: Referring Provider: Treating Provider/Extender: Jarold Song, Lyman Bishop M Weeks in Treatment: 11 Active Problems ICD-10 Encounter Code Description Active Date MDM Diagnosis E11.621 Type 2 diabetes mellitus with foot  ulcer 01/30/2020 No Yes L97.512 Non-pressure chronic ulcer of other part of right foot with fat layer exposed 01/30/2020 No Yes L97.411 Non-pressure chronic ulcer of right heel and midfoot limited to breakdown of 01/30/2020 No Yes skin E11.51 Type 2 diabetes mellitus with diabetic peripheral angiopathy without gangrene 01/30/2020 No Yes Inactive Problems Resolved Problems Electronic Signature(s) Signed: 04/21/2020 5:12:45 PM By: Linton Ham MD Entered By: Linton Ham on 04/21/2020 15:32:39 -------------------------------------------------------------------------------- Progress Note Details Patient Name: Date of Service: DONTARIOUS, SCHAUM [name] 04/21/2020 12:45 PM Medical Record Number: [id number] Patient Account Number: [id number] Date of Birth/Sex: Treating RN: July 20, 1951 (68 y.o. Jonathan [name] Primary Care Provider: Harvin Hazel M Other Clinician: Referring Provider: Treating Provider/Extender: Jarold Song, Lyman Bishop M Weeks in Treatment: 11 Subjective History of Present Illness (HPI) ADMISSION 01/30/2020 This is a 69 year old man who was hospitalized with Covid for a month in January 2021. Subsequently he was discharged to [facility] skilled facility for rehabilitation. His wife states that when he got home he had areas on the right lateral heel, right lateral fifth metatarsal head. They have been using Santyl to the right lateral heel no dressing on the right lateral foot. Our intake nurse discovered in small area on the right lateral great toe. He is  minimally ambulatory at this point. He does not have a known history of PAD although he is a diabetic. 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 Still a small open area on the medial ffirth metatarsal head. But this is also better. He has not heard anything about his arterial studies that we ordered 7/13; patient's review with vein and vascular/noninvasive testing is not till 8/3. Still an open area on the right lateral first met head. The area on the right lateral heel remains closed although it is callused 7/27; arterial studies on 8 3. Still has the open area on the right fifth metatarsal head. We have been using Hydrofera Blue 8/10-Comes a 2 weeks, had his vascular studies done, ABIs on both sides are reasonably good, TBI on the right is 145 Continuing with Hydrofera Blue, the wound is slightly larger than last time. 8/31; months since I last saw this man. We have been using Hydrofera Blue to the small remaining area on the right fifth metatarsal head. He did have arterial studies done on 8/3 and while these were not terrible they certainly were normal. He is noncompressible on the right but with triphasic waveforms and a TBI of 0.96 on the left he is noncompressible with monophasic waveforms at the PTA triphasic at the dorsalis pedis with a TBI of 0.59 9/14 this patient still has a punched-out area on the right fifth metatarsal head lateral aspect. This is very painful. I looked back on his arterial studies his  ABIs were noncompressible but with triphasic waveforms and a TBI of 0.96. We have been using Iodoflex Objective Constitutional Vitals Time Taken: 1:10 PM, Height: 71 in, Weight: 145 lbs, BMI: 20.2, Temperature: 98.3 F, Pulse: 84 bpm, Respiratory Rate: 16 breaths/min, Blood Pressure: 127/80 mmHg. Integumentary (Hair, Skin) Wound #  1 status is Open. Original cause of wound was Gradually Appeared. The wound is located on the Right,Lateral Foot. The wound measures 0.8cm length x 0.6cm width x 0.3cm depth; 0.377cm^2 area and 0.113cm^3 volume. There is Fat Layer (Subcutaneous Tissue) exposed. There is no tunneling noted, however, there is undermining starting at 12:00 and ending at 6:00 with a maximum distance of 0.7cm. There is a small amount of serous drainage noted. The wound margin is well defined and not attached to the wound base. There is medium (34-66%) pink, pale granulation within the wound bed. There is a medium (34- 66%) amount of necrotic tissue within the wound bed including Adherent Slough. 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 There was a Excisional Skin/Subcutaneous Tissue Debridement with a total area of 0.48 sq cm performed by Ricard Dillon., MD. With the following instrument(s): Curette to remove Viable and Non-Viable tissue/material. Material removed includes Callus, Subcutaneous Tissue, Slough, Skin: Dermis, and Skin: Epidermis after achieving pain control using Lidocaine 5% topical ointment. No specimens  were taken. A time out was conducted at 13:52, prior to the start of the procedure. A Moderate amount of bleeding was controlled with Pressure. The procedure was tolerated well with a pain level of 2 throughout and a pain level of 0 following the procedure. Post Debridement Measurements: 1cm length x 1cm width x 0.3cm depth; 0.236cm^3 volume. Character of Wound/Ulcer Post Debridement is improved. Severity of Tissue Post Debridement is: Fat layer exposed. Post procedure Diagnosis Wound #1: Same as Pre-Procedure Plan Follow-up Appointments: Return Appointment in 2 weeks. Dressing Change Frequency: Wound #1 Right,Lateral Foot: Change Dressing every other day. Wound Cleansing: Wound #1 Right,Lateral Foot: May shower with protection. - use a cast protector on all other days not changing the dressings. May shower and wash wound with soap and water. - with dressing changes. Primary Wound Dressing: Wound #1 Right,Lateral Foot: Iodoflex Secondary Dressing: Wound #1 Right,Lateral Foot: Foam Border - or gauze and kerlix Off-Loading: Open toe surgical shoe to: - right foot Other: - float right heel with pillows to offload pressure to heel while resting in chair and bed. 1. Once again difficult wound. I still have not gotten down to a viable surface I am continuing the Iodoflex changing every second day 2. This has the appearance of an arterial wound however his arterial studies including his TBI were not supportive however I wonder about his peroneal artery and the blood supply to this particular area Electronic Signature(s) Signed: 04/21/2020 5:12:45 PM By: Linton Ham MD Entered By: Linton Ham on 04/21/2020 15:38:13 -------------------------------------------------------------------------------- SuperBill Details Patient Name: Date of Service: MALON, BRANTON [name] 04/21/2020 Medical Record Number: [id number] Patient Account Number: [id number] Date of Birth/Sex: Treating RN: 11/11/1950 (68  y.o. Jonathan [name] Primary Care Provider: Harvin Hazel M Other Clinician: Referring Provider: Treating Provider/Extender: Jarold Song, Lyman Bishop M Weeks in Treatment: 11 Diagnosis Coding ICD-10 Codes Code Description E11.621 Type 2 diabetes mellitus with foot ulcer L97.512 Non-pressure chronic ulcer of other part of right foot with fat layer exposed L97.411 Non-pressure chronic ulcer of right heel and  midfoot limited to breakdown of skin E11.51 Type 2 diabetes mellitus with diabetic peripheral angiopathy without gangrene Facility Procedures CPT4 Code: [id number] Description: 23300 - DEB SUBQ TISSUE 20 SQ CM/< ICD-10 Diagnosis Description L97.512 Non-pressure chronic ulcer of other part of right foot with fat layer exposed E11.621 Type 2 diabetes mellitus with foot ulcer Modifier: Quantity: 1 Physician Procedures : CPT4 Code Description Modifier [id number] - WC PHYS SUBQ TISS 20 SQ CM ICD-10 Diagnosis Description L97.512 Non-pressure chronic ulcer of other part of right foot with fat layer exposed E11.621 Type 2 diabetes mellitus with foot ulcer Quantity: 1 Electronic Signature(s) Signed: 04/21/2020 5:12:45 PM By: Linton Ham MD Entered By: Linton Ham on 04/21/2020 15:38:48

## 2020-04-27 NOTE — Progress Notes (Signed)
REINER, LOEWEN ([id number]) Visit Report for 04/21/2020 Arrival Information Details Patient Name: Date of Service: Jonathan [name], Jonathan [name] 04/21/2020 12:45 PM Medical Record Number: [id number] Patient Account Number: [id number] Date of Birth/Sex: Treating RN: 1951-02-20 (68 y.o. Jonathan [name]) Carlene Coria Primary Care Damya Comley: Harvin Hazel M Other Clinician: Referring Sharetta Ricchio: Treating Kevin Mario/Extender: Jarold Song, Elder Negus Weeks in Treatment: 11 Visit Information History Since Last Visit Added or deleted any medications: No Patient Arrived: Wheel Chair Any new allergies or adverse reactions: No Arrival Time: 13:10 Had a fall or experienced change in No Accompanied By: wife activities of daily living that may affect Transfer Assistance: None risk of falls: Patient Identification Verified: Yes Signs or symptoms of abuse/neglect since last visito No Secondary Verification Process Completed: Yes Hospitalized since last visit: No Patient Has Alerts: Yes Implantable device outside of the clinic excluding No Patient Alerts: Patient on Blood Thinner cellular tissue based products placed in the center Right ABI: non compress since last visit: Has Dressing in Place as Prescribed: Yes Pain Present Now: No Electronic Signature(s) Signed: 04/22/2020 8:07:26 AM By: Sandre Kitty Entered By: Sandre Kitty on 04/21/2020 13:10:47 -------------------------------------------------------------------------------- Encounter Discharge Information Details Patient Name: Date of Service: Jonathan [name], Jonathan [name] 04/21/2020 12:45 PM Medical Record Number: [id number] Patient Account Number: [id number] Date of Birth/Sex: Treating RN: May 20, 1951 (69 y.o. Jonathan [name] Primary Care Kazumi Lachney: Harvin Hazel M Other Clinician: Referring Deforest Maiden: Treating Iretha Kirley/Extender: Jarold Song, Elder Negus Weeks in Treatment: 11 Encounter Discharge Information Items Post Procedure Vitals Discharge Condition:  Stable Temperature (F): 98.3 Ambulatory Status: Wheelchair Pulse (bpm): 84 Discharge Destination: Home Respiratory Rate (breaths/min): 16 Transportation: Private Auto Blood Pressure (mmHg): 127/80 Accompanied By: wife Schedule Follow-up Appointment: Yes Clinical Summary of Care: Patient Declined Electronic Signature(s) Signed: 04/23/2020 4:34:39 PM By: Kela Millin Entered By: Kela Millin on 04/21/2020 14:14:17 -------------------------------------------------------------------------------- Lower Extremity Assessment Details Patient Name: Date of Service: Jonathan [name], Jonathan [name] 04/21/2020 12:45 PM Medical Record Number: [id number] Patient Account Number: [id number] Date of Birth/Sex: Treating RN: 1951-05-29 (69 y.o. Jonathan [name] Primary Care Niyla Marone: Harvin Hazel M Other Clinician: Referring Kenishia Plack: Treating Mildred Tuccillo/Extender: Jarold Song, Lyman Bishop M Weeks in Treatment: 11 Edema Assessment Assessed: Shirlyn Goltz: No] Patrice Paradise: No] Edema: [Left: N] [Right: o] Calf Left: Right: Point of Measurement: 41 cm From Medial Instep cm 35.5 cm Ankle Left: Right: Point of Measurement: 18 cm From Medial Instep cm 19.5 cm Vascular Assessment Pulses: Dorsalis Pedis Palpable: [Right:Yes] Electronic Signature(s) Signed: 04/22/2020 5:57:30 PM By: Levan Hurst RN, BSN Entered By: Levan Hurst on 04/21/2020 13:34:23 -------------------------------------------------------------------------------- Multi Wound Chart Details Patient Name: Date of Service: Jonathan [name], Jonathan [name] 04/21/2020 12:45 PM Medical Record Number: [id number] Patient Account Number: [id number] Date of Birth/Sex: Treating RN: [birth year] (68 y.o. Jonathan [name] Primary Care Shadavia Dampier: Harvin Hazel M Other Clinician: Referring Iyanna Drummer: Treating Ridgely Anastacio/Extender: Jarold Song, Lyman Bishop M Weeks in Treatment: 11 Vital Signs Height(in): 71 Pulse(bpm): 71 Weight(lbs): 145 Blood Pressure(mmHg):  127/80 Body Mass Index(BMI): 20 Temperature(F): 98.3 Respiratory Rate(breaths/min): 16 Photos: [1:No Photos Right, Lateral Foot] [N/A:N/A N/A] Wound Location: [1:Gradually Appeared] [N/A:N/A] Wounding Event: [1:Diabetic Wound/Ulcer of the Lower] [N/A:N/A] Primary Etiology: [1:Extremity Cataracts, Congestive Heart Failure,] [N/A:N/A] Comorbid History: [1:Hypertension, Myocardial Infarction, Hepatitis C, Type II Diabetes, End Stage Renal Disease, Dementia, Neuropathy 12/26/2019] [N/A:N/A] Date Acquired: [1:11] [N/A:N/A] Weeks of Treatment: [1:Open] [N/A:N/A] Wound Status: [1:0.8x0.6x0.3] [N/A:N/A] Measurements L x W x D (cm) [1:0.377] [N/A:N/A] A (cm) : rea [1:0.113] [N/A:N/A] Volume (cm) : [1:-140.10%] [N/A:N/A] %  Reduction in A rea: [1:-79.40%] [N/A:N/A] % Reduction in Volume: [1:12] Starting Position 1 (o'clock): [1:6] Ending Position 1 (o'clock): [1:0.7] Maximum Distance 1 (cm): [1:Yes] [N/A:N/A] Undermining: [1:Grade 2] [N/A:N/A] Classification: [1:Small] [N/A:N/A] Exudate A mount: [1:Serous] [N/A:N/A] Exudate Type: [1:amber] [N/A:N/A] Exudate Color: [1:Well defined, not attached] [N/A:N/A] Wound Margin: [1:Medium (34-66%)] [N/A:N/A] Granulation A mount: [1:Pink, Pale] [N/A:N/A] Granulation Quality: [1:Medium (34-66%)] [N/A:N/A] Necrotic A mount: [1:Fat Layer (Subcutaneous Tissue): Yes N/A] Exposed Structures: [1:Fascia: No Tendon: No Muscle: No Joint: No Bone: No Small (1-33%)] [N/A:N/A] Epithelialization: [1:Debridement - Excisional] [N/A:N/A] Debridement: Pre-procedure Verification/Time Out 13:52 [N/A:N/A] Taken: [1:Lidocaine 5% topical ointment] [N/A:N/A] Pain Control: [1:Callus, Subcutaneous, Slough] [N/A:N/A] Tissue Debrided: [1:Skin/Subcutaneous Tissue] [N/A:N/A] Level: [1:0.48] [N/A:N/A] Debridement A (sq cm): [1:rea Curette] [N/A:N/A] Instrument: [1:Moderate] [N/A:N/A] Bleeding: [1:Pressure] [N/A:N/A] Hemostasis A chieved: [1:2] [N/A:N/A] Procedural Pain:  [1:0] [N/A:N/A] Post Procedural Pain: [1:Procedure was tolerated well] [N/A:N/A] Debridement Treatment Response: [1:1x1x0.3] [N/A:N/A] Post Debridement Measurements L x W x D (cm) [1:0.236] [N/A:N/A] Post Debridement Volume: (cm) [1:Debridement] [N/A:N/A] Treatment Notes Wound #1 (Right, Lateral Foot) 1. Cleanse With Wound Cleanser 2. Periwound Care Skin Prep 3. Primary Dressing Applied Iodoflex 4. Secondary Dressing Foam Border Dressing Electronic Signature(s) Signed: 04/21/2020 5:12:45 PM By: Linton Ham MD Signed: 04/27/2020 1:15:48 PM By: Carlene Coria RN Entered By: Linton Ham on 04/21/2020 15:32:46 -------------------------------------------------------------------------------- [place] Details Patient Name: Date of Service: Jonathan [name], Jonathan [name] 04/21/2020 12:45 PM Medical Record Number: [id number] Patient Account Number: [id number] Date of Birth/Sex: Treating RN: Dec 26, 1950 (68 y.o. Jonathan [name] Primary Care Doyle Kunath: Marykay Lex Other Clinician: Referring Sennie Borden: Treating Garwood Wentzell/Extender: Jarold Song, Lyman Bishop M Weeks in Treatment: 11 Active Inactive Wound/Skin Impairment Nursing Diagnoses: Knowledge deficit related to ulceration/compromised skin integrity Goals: Patient/caregiver will verbalize understanding of skin care regimen Date Initiated: 01/30/2020 Target Resolution Date: 05/15/2020 Goal Status: Active Ulcer/skin breakdown will have a volume reduction of 50% by week 8 Date Initiated: 01/30/2020 Date Inactivated: 02/18/2020 Target Resolution Date: 03/31/2020 Goal Status: Unmet Unmet Reason: comorbities Ulcer/skin breakdown will have a volume reduction of 80% by week 12 Date Initiated: 02/18/2020 Date Inactivated: 04/07/2020 Target Resolution Date: 03/20/2020 Goal Status: Unmet Unmet Reason: comorbities Ulcer/skin breakdown will heal within 14 weeks Date Initiated: 04/07/2020 Date Inactivated: 04/21/2020 Target  Resolution Date: 05/07/2020 Goal Status: Unmet Unmet Reason: comorbities Interventions: Assess patient/caregiver ability to obtain necessary supplies Assess patient/caregiver ability to perform ulcer/skin care regimen upon admission and as needed Provide education on ulcer and skin care Treatment Activities: Skin care regimen initiated : 01/30/2020 Topical wound management initiated : 01/30/2020 Notes: Electronic Signature(s) Signed: 04/27/2020 1:15:48 PM By: Carlene Coria RN Entered By: Carlene Coria on 04/21/2020 13:29:02 -------------------------------------------------------------------------------- Pain Assessment Details Patient Name: Date of Service: Jonathan [name], Jonathan [name] 04/21/2020 12:45 PM Medical Record Number: [id number] Patient Account Number: [id number] Date of Birth/Sex: Treating RN: Feb 08, 1951 (68 y.o. Jonathan [name] Primary Care Jareli Highland: Harvin Hazel M Other Clinician: Referring Alister Staver: Treating Stehanie Ekstrom/Extender: Jarold Song, Lyman Bishop M Weeks in Treatment: 11 Active Problems Location of Pain Severity and Description of Pain Patient Has Paino No Site Locations Pain Management and Medication Current Pain Management: Electronic Signature(s) Signed: 04/22/2020 8:07:26 AM By: Sandre Kitty Signed: 04/27/2020 1:15:48 PM By: Carlene Coria RN Entered By: Sandre Kitty on 04/21/2020 13:11:07 -------------------------------------------------------------------------------- Patient/Caregiver Education Details Patient Name: Date of Service: Jonathan [name], Jonathan [name] 9/14/2021andnbsp12:45 PM Medical Record Number: [id number] Patient Account Number: [id number] Date of Birth/Gender: Treating RN: 03/06/1951 (68 y.o. Jonathan [name] Primary Care Physician: Harvin Hazel  M Other Clinician: Referring Physician: Treating Physician/Extender: Jarold Song, Elder Negus Weeks in Treatment: 11 Education Assessment Education Provided To: Patient Education Topics  Provided Wound/Skin Impairment: Methods: Explain/Verbal Responses: State content correctly Electronic Signature(s) Signed: 04/27/2020 1:15:48 PM By: Carlene Coria RN Entered By: Carlene Coria on 04/21/2020 13:29:17 -------------------------------------------------------------------------------- Wound Assessment Details Patient Name: Date of Service: Jonathan [name], Jonathan [name] 04/21/2020 12:45 PM Medical Record Number: [id number] Patient Account Number: [id number] Date of Birth/Sex: Treating RN: 1951/08/08 (68 y.o. Jonathan [name]) Carlene Coria Primary Care Brooklynn Brandenburg: Harvin Hazel M Other Clinician: Referring Drezden Seitzinger: Treating Brigida Scotti/Extender: Jarold Song, Lyman Bishop M Weeks in Treatment: 11 Wound Status Wound Number: 1 Primary Diabetic Wound/Ulcer of the Lower Extremity Etiology: Wound Location: Right, Lateral Foot Wound Open Wounding Event: Gradually Appeared Status: Date Acquired: 12/26/2019 Comorbid Cataracts, Congestive Heart Failure, Hypertension, Myocardial Weeks Of Treatment: 11 History: Infarction, Hepatitis C, Type II Diabetes, End Stage Renal Disease, Clustered Wound: No Dementia, Neuropathy Photos Photo Uploaded By: Mikeal Hawthorne on 04/23/2020 11:45:20 Wound Measurements Length: (cm) 0.8 Width: (cm) 0.6 Depth: (cm) 0.3 Area: (cm) 0.377 Volume: (cm) 0.113 % Reduction in Area: -140.1% % Reduction in Volume: -79.4% Epithelialization: Small (1-33%) Tunneling: No Undermining: Yes Starting Position (o'clock): 12 Ending Position (o'clock): 6 Maximum Distance: (cm) 0.7 Wound Description Classification: Grade 2 Wound Margin: Well defined, not attached Exudate Amount: Small Exudate Type: Serous Exudate Color: amber Foul Odor After Cleansing: No Slough/Fibrino Yes Wound Bed Granulation Amount: Medium (34-66%) Exposed Structure Granulation Quality: Pink, Pale Fascia Exposed: No Necrotic Amount: Medium (34-66%) Fat Layer (Subcutaneous Tissue) Exposed: Yes Necrotic Quality:  Adherent Slough Tendon Exposed: No Muscle Exposed: No Joint Exposed: No Bone Exposed: No Treatment Notes Wound #1 (Right, Lateral Foot) 1. Cleanse With Wound Cleanser 2. Periwound Care Skin Prep 3. Primary Dressing Applied Iodoflex 4. Secondary Dressing Foam Border Dressing Electronic Signature(s) Signed: 04/22/2020 5:57:30 PM By: Levan Hurst RN, BSN Signed: 04/27/2020 1:15:48 PM By: Carlene Coria RN Entered By: Levan Hurst on 04/21/2020 13:35:03 -------------------------------------------------------------------------------- Vitals Details Patient Name: Date of Service: Jonathan [name], Jonathan [name] 04/21/2020 12:45 PM Medical Record Number: [id number] Patient Account Number: [id number] Date of Birth/Sex: Treating RN: 1950/09/25 (68 y.o. Jonathan [name] Primary Care Santanna Olenik: Harvin Hazel M Other Clinician: Referring Lorayne Getchell: Treating Jaleesa Cervi/Extender: Jarold Song, Lyman Bishop M Weeks in Treatment: 11 Vital Signs Time Taken: 13:10 Temperature (F): 98.3 Height (in): 71 Pulse (bpm): 84 Weight (lbs): 145 Respiratory Rate (breaths/min): 16 Body Mass Index (BMI): 20.2 Blood Pressure (mmHg): 127/80 Reference Range: 80 - 120 mg / dl Electronic Signature(s) Signed: 04/22/2020 8:07:26 AM By: Sandre Kitty Entered By: Sandre Kitty on 04/21/2020 13:11:02

## 2020-05-05 ENCOUNTER — Encounter (HOSPITAL_BASED_OUTPATIENT_CLINIC_OR_DEPARTMENT_OTHER): Payer: Medicare Other | Admitting: Internal Medicine

## 2020-05-05 ENCOUNTER — Other Ambulatory Visit: Payer: Self-pay

## 2020-05-05 ENCOUNTER — Other Ambulatory Visit (HOSPITAL_COMMUNITY): Payer: Self-pay | Admitting: Internal Medicine

## 2020-05-05 ENCOUNTER — Ambulatory Visit (HOSPITAL_COMMUNITY)
Admission: RE | Admit: 2020-05-05 | Discharge: 2020-05-05 | Disposition: A | Discharge status: Home / Self Care | Payer: Medicare Other | Source: Ambulatory Visit | Attending: Internal Medicine | Admitting: Internal Medicine

## 2020-05-05 DIAGNOSIS — L97509 Non-pressure chronic ulcer of other part of unspecified foot with unspecified severity: Secondary | ICD-10-CM

## 2020-05-05 DIAGNOSIS — M869 Osteomyelitis, unspecified: Secondary | ICD-10-CM | POA: Insufficient documentation

## 2020-05-05 DIAGNOSIS — E1151 Type 2 diabetes mellitus with diabetic peripheral angiopathy without gangrene: Secondary | ICD-10-CM | POA: Diagnosis not present

## 2020-05-05 DIAGNOSIS — E11621 Type 2 diabetes mellitus with foot ulcer: Secondary | ICD-10-CM | POA: Diagnosis not present

## 2020-05-05 DIAGNOSIS — E1169 Type 2 diabetes mellitus with other specified complication: Secondary | ICD-10-CM | POA: Insufficient documentation

## 2020-05-05 NOTE — Progress Notes (Signed)
[name], Jonathan ([id number]) Visit Report for 05/05/2020 Debridement Details Patient Name: Date of Service: ZYMARION, FAVORITE 05/05/2020 12:45 PM Medical Record Number: [id number] Patient Account Number: [id number] Date of Birth/Sex: Treating RN: 02/08/1951 (69 y.o. Jonathan [name]) Carlene Coria Primary Care Provider: Harvin Hazel M Other Clinician: Referring Provider: Treating Provider/Extender: Jarold Song, Lyman Bishop M Weeks in Treatment: 13 Debridement Performed for Assessment: Wound #1 Right,Lateral Foot Performed By: Physician Ricard Dillon., MD Debridement Type: Debridement Severity of Tissue Pre Debridement: Fat layer exposed Level of Consciousness (Pre-procedure): Awake and Alert Pre-procedure Verification/Time Out Yes - 13:03 Taken: Start Time: 13:03 Pain Control: Lidocaine 5% topical ointment T Area Debrided (L x W): otal 1.2 (cm) x 1 (cm) = 1.2 (cm) Tissue and other material debrided: Viable, Non-Viable, Eschar, Slough, Subcutaneous, Skin: Dermis , Skin: Epidermis, Slough Level: Skin/Subcutaneous Tissue Debridement Description: Excisional Instrument: Curette Bleeding: Moderate Hemostasis Achieved: Pressure End Time: 13:26 Procedural Pain: 2 Post Procedural Pain: 0 Response to Treatment: Procedure was tolerated well Level of Consciousness (Post- Awake and Alert procedure): Post Debridement Measurements of Total Wound Length: (cm) 1.2 Width: (cm) 1 Depth: (cm) 0.5 Volume: (cm) 0.471 Character of Wound/Ulcer Post Debridement: Improved Severity of Tissue Post Debridement: Fat layer exposed Post Procedure Diagnosis Same as Pre-procedure Electronic Signature(s) Signed: 05/05/2020 5:19:28 PM By: Linton Ham MD Signed: 05/05/2020 5:34:55 PM By: Carlene Coria RN Entered By: Linton Ham on 05/05/2020 13:28:34 -------------------------------------------------------------------------------- HPI Details Patient Name: Date of Service: Jonathan, HAGINS [name] 05/05/2020 12:45  PM Medical Record Number: [id number] Patient Account Number: [id number] Date of Birth/Sex: Treating RN: 1951/02/16 (69 y.o. [name] Primary Care Provider: Harvin Hazel M Other Clinician: Referring Provider: Treating Provider/Extender: Jarold Song, Lyman Bishop M Weeks in Treatment: 13 History of Present Illness HPI Description: ADMISSION 01/30/2020 This is a 69 year old man who was hospitalized with Covid for a month in January 2021. Subsequently he was discharged to [facility] skilled facility for rehabilitation. His wife states that when he got home he had areas on the right lateral heel, right lateral fifth metatarsal head. They have been using Santyl to the right lateral heel no dressing on the right lateral foot. Our intake nurse discovered in small area on the right lateral great toe. He is minimally ambulatory at this point. He does not have a known history of PAD although he is a diabetic. 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 Still a small open area on the medial ffirth metatarsal head. But this is also better. He has not heard anything about his arterial studies that we ordered 7/13; patient's review with vein and vascular/noninvasive testing is not till 8/3. Still an open area on the right lateral first met head. The area on the right lateral heel remains closed although it is callused 7/27; arterial studies on 8 3. Still has the open area on the right fifth metatarsal head. We have  been using Hydrofera Blue 8/10-Comes a 2 weeks, had his vascular studies done, ABIs on both sides are reasonably good, TBI on the right is 145 Continuing  with Hydrofera Blue, the wound is slightly larger than last time. 8/31; months since I last saw this man. We have been using Hydrofera Blue to the small remaining area on the right fifth metatarsal head. He did have arterial studies done on 8/3 and while these were not terrible they certainly were normal. He is noncompressible on the right but with triphasic waveforms and a TBI of 0.96 on the left he is noncompressible with monophasic waveforms at the PTA triphasic at the dorsalis pedis with a TBI of 0.59 9/14 this patient still has a punched-out area on the right fifth metatarsal head lateral aspect. This is very painful. I looked back on his arterial studies his ABIs were noncompressible but with triphasic waveforms and a TBI of 0.96. We have been using Iodoflex 9/27; this patient has a punched-out wound on the right fifth metatarsal head lateral aspect. Very painful. He is a minimal ambulator and very difficult to pick up on claudication. I have once again reviewed his arterial studies which were really not all that impressive. He is noncompressible on the right but he had triphasic waveforms at the PTA and DP and a great toe pressure of 145 with a TBI of 0.96 we have been using Iodoflex without any improvement Electronic Signature(s) Signed: 05/05/2020 5:19:28 PM By: Linton Ham MD Entered By: Linton Ham on 05/05/2020 13:30:13 -------------------------------------------------------------------------------- Physical Exam Details Patient Name: Date of Service: Jonathan, [name] 05/05/2020 12:45 PM Medical Record Number: [id number] Patient Account Number: [id number] Date of Birth/Sex: Treating RN: May 08, 1951 (69 y.o. [name] Primary Care Provider: Harvin Hazel M Other Clinician: Referring Provider: Treating Provider/Extender:  Jarold Song, Lyman Bishop M Weeks in Treatment: 13 Constitutional Sitting or standing Blood Pressure is within target range for patient.. Pulse regular and within target range for patient.Marland Kitchen Respirations regular, non-labored and within target range.. Temperature is normal and within the target range for the patient.Marland Kitchen Appears in no distress. Notes Wound exam; right lateral fifth metatarsal head. Absolutely no improvement. Completely nonviable surface. I used a #3 curette to debride this he tolerates it very poorly minimal bleeding. Nevertheless I am able to get down to a reds granulated surface with slightly weeping tissue Electronic Signature(s) Signed: 05/05/2020 5:19:28 PM By: Linton Ham MD Entered By: Linton Ham on 05/05/2020 13:31:30 -------------------------------------------------------------------------------- Physician Orders Details Patient Name: Date of Service: GOLDIE, DIMMER [name] 05/05/2020 12:45 PM Medical Record Number: [id number] Patient Account Number: [id number] Date of Birth/Sex: Treating RN: 1950-09-09 (69 y.o. [name] Primary Care Provider: Harvin Hazel M Other Clinician: Referring Provider: Treating Provider/Extender: Jarold Song, Elder Negus Weeks in Treatment: 13 Verbal / Phone Orders: No Diagnosis Coding ICD-10 Coding Code Description E11.621 Type 2 diabetes mellitus with foot ulcer L97.512 Non-pressure chronic ulcer of other part of right foot with fat layer exposed L97.411 Non-pressure chronic ulcer of right heel and midfoot limited to breakdown of skin E11.51 Type 2 diabetes mellitus with diabetic peripheral angiopathy without gangrene Follow-up Appointments Return Appointment in 2 weeks. Dressing Change Frequency Wound #1 Right,Lateral Foot Change Dressing every other day. Wound Cleansing Wound #1 Right,Lateral Foot May shower with protection. - use a cast protector on all other days not changing the dressings. May shower and  wash wound with soap and water. - with dressing changes. Primary Wound Dressing Wound #1 Right,Lateral Foot Iodoflex Secondary Dressing Wound #1 Right,Lateral Foot Foam Border - or gauze and kerlix Off-Loading Open toe surgical shoe to: - right foot Other: - float  right heel with pillows to offload pressure to heel while resting in chair and bed. Radiology X-ray, foot , right - right foot non healing wound lateral side - (ICD10 E11.621 - Type 2 diabetes mellitus with foot ulcer) Electronic Signature(s) Signed: 05/05/2020 5:19:28 PM By: Linton Ham MD Signed: 05/05/2020 5:34:55 PM By: Carlene Coria RN Entered By: Carlene Coria on 05/05/2020 13:26:04 Prescription 05/05/2020 -------------------------------------------------------------------------------- Antonietta Barcelona MD Patient Name: Provider: 31-Mar-1951 [id number] Date of Birth: NPI#: Jonathan [name] [id number] Sex: DEA #: [phone] [id number] Phone #: License #: [place] Patient Address: [address], [place] [address] [address], [place] [address] [phone] Allergies No Known Allergies Provider's Orders X-ray, foot , right - ICD10: E11.621 - right foot non healing wound lateral side Hand Signature: Date(s): Electronic Signature(s) Signed: 05/05/2020 5:19:28 PM By: Linton Ham MD Signed: 05/05/2020 5:34:55 PM By: Carlene Coria RN Entered By: Carlene Coria on 05/05/2020 13:26:05 -------------------------------------------------------------------------------- Problem List Details Patient Name: Date of Service: KEY, CEN [name] 05/05/2020 12:45 PM Medical Record Number: [id number] Patient Account Number: [id number] Date of Birth/Sex: Treating RN: [birth year] (69 y.o. [name] Primary Care Provider: Harvin Hazel M Other Clinician: Referring Provider: Treating Provider/Extender: Jarold Song, Lyman Bishop M Weeks in Treatment:  13 Active Problems ICD-10 Encounter Code Description Active Date MDM Diagnosis E11.621 Type 2 diabetes mellitus with foot ulcer 01/30/2020 No Yes L97.512 Non-pressure chronic ulcer of other part of right foot with fat layer exposed 01/30/2020 No Yes L97.411 Non-pressure chronic ulcer of right heel and midfoot limited to breakdown of 01/30/2020 No Yes skin E11.51 Type 2 diabetes mellitus with diabetic peripheral angiopathy without gangrene 01/30/2020 No Yes Inactive Problems Resolved Problems Electronic Signature(s) Signed: 05/05/2020 5:19:28 PM By: Linton Ham MD Entered By: Linton Ham on 05/05/2020 13:26:50 -------------------------------------------------------------------------------- Progress Note Details Patient Name: Date of Service: CHRISHON, MARTINO [name] 05/05/2020 12:45 PM Medical Record Number: [id number] Patient Account Number: [id number] Date of Birth/Sex: Treating RN: [birth year] (69 y.o. [name] Primary Care Provider: Harvin Hazel M Other Clinician: Referring Provider: Treating Provider/Extender: Jarold Song, Lyman Bishop M Weeks in Treatment: 13 Subjective History of Present Illness (HPI) ADMISSION 01/30/2020 This is a 69 year old man who was hospitalized with Covid for a month in January 2021. Subsequently he was discharged to [facility] skilled facility for rehabilitation. His wife states that when he got home he had areas on the right lateral heel, right lateral fifth metatarsal head. They have been using Santyl to the right lateral heel no dressing on the right lateral foot. Our intake nurse discovered in small area on the right lateral great toe. He is minimally ambulatory at this point. He does not have a known history of PAD although he is a diabetic. 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 Still a small open area on the medial ffirth metatarsal head. But this is also better. He has not heard anything about his arterial studies that we ordered 7/13; patient's review with vein and vascular/noninvasive testing is  not till 8/3. Still an open area on the right lateral first met head. The area on the right lateral heel remains closed although it is callused 7/27; arterial studies on 8 3. Still has the open area on the right fifth metatarsal head. We have been using Hydrofera Blue 8/10-Comes a 2 weeks, had his vascular studies done, ABIs on both sides are reasonably good, TBI on the right is 145 Continuing with Hydrofera Blue, the wound is slightly larger than last time. 8/31; months since I last saw this man. We have been using Hydrofera Blue to the small remaining area on the right fifth metatarsal head. He did have arterial studies done on 8/3 and while these were not terrible they certainly were normal. He is noncompressible on the right but with triphasic waveforms and a TBI of 0.96 on the left he is noncompressible with monophasic waveforms at the PTA triphasic at the dorsalis pedis with a TBI of 0.59 9/14 this patient still has a punched-out area on the right fifth metatarsal head lateral aspect. This is very painful. I looked back on his arterial studies his ABIs were noncompressible but with triphasic waveforms and a TBI of 0.96. We have been using Iodoflex 9/27; this patient has a punched-out wound on the right fifth metatarsal head lateral aspect. Very painful. He is a minimal ambulator and very difficult to pick up on claudication. I have once again  reviewed his arterial studies which were really not all that impressive. He is noncompressible on the right but he had triphasic waveforms at the PTA and DP and a great toe pressure of 145 with a TBI of 0.96 we have been using Iodoflex without any improvement Objective Constitutional Sitting or standing Blood Pressure is within target range for patient.. Pulse regular and within target range for patient.Marland Kitchen Respirations regular, non-labored and within target range.. Temperature is normal and within the target range for the patient.Marland Kitchen Appears in no distress. Vitals Time Taken: 1:00 PM, Height: 71 in, Weight: 145 lbs, BMI: 20.2, Temperature: 98.2 F, Pulse: 88 bpm, Respiratory Rate: 18 breaths/min, Blood Pressure: 115/73 mmHg. General Notes: Wound exam; right lateral fifth metatarsal head. Absolutely no improvement. Completely nonviable surface. I used a #3 curette to debride this he tolerates it very poorly minimal bleeding. Nevertheless I am able to get down to a reds granulated surface with slightly weeping tissue Integumentary (Hair, Skin) Wound #1 status is Open. Original cause of wound was Gradually Appeared. The wound is located on the Right,Lateral Foot. The wound measures 1.2cm length x 1cm width x 0.5cm depth; 0.942cm^2 area and 0.471cm^3 volume. There is Fat Layer (Subcutaneous Tissue) exposed. There is no tunneling noted, however, there is undermining starting at 12:00 and ending at 12:00 with a maximum distance of 0.4cm. There is a small amount of serous drainage noted. The wound margin is well defined and not attached to the wound base. There is small (1-33%) pink, pale granulation within the wound bed. There is a large (67-100%) amount of necrotic tissue within the wound bed including Eschar and Adherent Slough. Assessment Active Problems ICD-10 Type 2 diabetes mellitus with foot ulcer Non-pressure chronic ulcer of other part of right foot with fat layer exposed Non-pressure chronic  ulcer of right heel and midfoot limited to breakdown of skin Type 2 diabetes mellitus with diabetic peripheral angiopathy without gangrene Procedures Wound #1 Pre-procedure diagnosis of Wound #1 is a Diabetic Wound/Ulcer of the Lower Extremity located on the Right,Lateral Foot .Severity of Tissue Pre Debridement  is: Fat layer exposed. There was a Excisional Skin/Subcutaneous Tissue Debridement with a total area of 1.2 sq cm performed by Ricard Dillon., MD. With the following instrument(s): Curette to remove Viable and Non-Viable tissue/material. Material removed includes Eschar, Subcutaneous Tissue, Slough, Skin: Dermis, and Skin: Epidermis after achieving pain control using Lidocaine 5% topical ointment. No specimens were taken. A time out was conducted at 13:03, prior to the start of the procedure. A Moderate amount of bleeding was controlled with Pressure. The procedure was tolerated well with a pain level of 2 throughout and a pain level of 0 following the procedure. Post Debridement Measurements: 1.2cm length x 1cm width x 0.5cm depth; 0.471cm^3 volume. Character of Wound/Ulcer Post Debridement is improved. Severity of Tissue Post Debridement is: Fat layer exposed. Post procedure Diagnosis Wound #1: Same as Pre-Procedure Plan Follow-up Appointments: Return Appointment in 2 weeks. Dressing Change Frequency: Wound #1 Right,Lateral Foot: Change Dressing every other day. Wound Cleansing: Wound #1 Right,Lateral Foot: May shower with protection. - use a cast protector on all other days not changing the dressings. May shower and wash wound with soap and water. - with dressing changes. Primary Wound Dressing: Wound #1 Right,Lateral Foot: Iodoflex Secondary Dressing: Wound #1 Right,Lateral Foot: Foam Border - or gauze and kerlix Off-Loading: Open toe surgical shoe to: - right foot Other: - float right heel with pillows to offload pressure to heel while resting in chair and  bed. Radiology ordered were: X-ray, foot , right - right foot non healing wound lateral side #1 completely nonviable surface once again debrided. I am continuing Iodoflex 2. I am going to get an x-ray of the underlying bone of this area. 3. This looks like an ischemic wound but not well supported by his noninvasive arterial studies although I wonder about the peroneal artery distribution. 4. If I cannot get any healing out of this I may have vascular look at this even though his TBI's and great toe pressures suggest he should have adequate blood on the right Electronic Signature(s) Signed: 05/05/2020 5:19:28 PM By: Linton Ham MD Entered By: Linton Ham on 05/05/2020 13:33:35 -------------------------------------------------------------------------------- SuperBill Details Patient Name: Date of Service: SANTE, BIEDERMANN [name] 05/05/2020 Medical Record Number: [id number] Patient Account Number: [id number] Date of Birth/Sex: Treating RN: August 01, 1951 (69 y.o. [name] Primary Care Provider: Harvin Hazel M Other Clinician: Referring Provider: Treating Provider/Extender: Jarold Song, Lyman Bishop M Weeks in Treatment: 13 Diagnosis Coding ICD-10 Codes Code Description E11.621 Type 2 diabetes mellitus with foot ulcer L97.512 Non-pressure chronic ulcer of other part of right foot with fat layer exposed L97.411 Non-pressure chronic ulcer of right heel and midfoot limited to breakdown of skin E11.51 Type 2 diabetes mellitus with diabetic peripheral angiopathy without gangrene Facility Procedures CPT4 Code: [id number] ICD L9 E1 Description: 67341 - DEB SUBQ TISSUE 20 SQ CM/< -10 Diagnosis Description 7.512 Non-pressure chronic ulcer of other part of right foot with fat layer exposed 1.621 Type 2 diabetes mellitus with foot ulcer Modifier: Quantity: 1 Physician Procedures : CPT4 Code Description Modifier [id number] - WC PHYS SUBQ TISS 20 SQ CM ICD-10 Diagnosis Description L97.512  Non-pressure chronic ulcer of other part of right foot with fat layer exposed E11.621 Type 2 diabetes mellitus with foot ulcer Quantity: 1 Electronic Signature(s) Signed: 05/05/2020 5:19:28 PM By: Linton Ham MD Entered By: Linton Ham on 05/05/2020 13:33:58

## 2020-05-05 NOTE — Progress Notes (Signed)
SHERRILL, MCKAMIE ([id number]) Visit Report for 05/05/2020 Arrival Information Details Patient Name: Date of Service: CHAVEZ, ROSOL 05/05/2020 12:45 PM Medical Record Number: [id number] Patient Account Number: [id number] Date of Birth/Sex: Treating RN: [birth year] (69 y.o. [name] Primary Care Vester Titsworth: Harvin Hazel M Other Clinician: Referring Otis Burress: Treating Aubre Quincy/Extender: Jarold Song, Elder Negus Weeks in Treatment: 56 Visit Information History Since Last Visit Added or deleted any medications: No Patient Arrived: Wheel Chair Any new allergies or adverse reactions: No Arrival Time: 13:01 Had a fall or experienced change in No Accompanied By: family member activities of daily living that may affect Transfer Assistance: None risk of falls: Patient Identification Verified: Yes Signs or symptoms of abuse/neglect since last visito No Secondary Verification Process Completed: Yes Hospitalized since last visit: No Patient Has Alerts: Yes Implantable device outside of the clinic excluding No Patient Alerts: Patient on Blood Thinner cellular tissue based products placed in the center Right ABI: non compress since last visit: Has Dressing in Place as Prescribed: Yes Pain Present Now: No Electronic Signature(s) Signed: 05/05/2020 5:04:51 PM By: Kela Millin Entered By: Kela Millin on 05/05/2020 13:02:12 -------------------------------------------------------------------------------- Encounter Discharge Information Details Patient Name: Date of Service: HELIX, LAFONTAINE [name] 05/05/2020 12:45 PM Medical Record Number: [id number] Patient Account Number: [id number] Date of Birth/Sex: Treating RN: [birth year] (69 y.o. [name] Primary Care Hayslee Casebolt: Harvin Hazel M Other Clinician: Referring Else Habermann: Treating Letticia Bhattacharyya/Extender: Jarold Song, Elder Negus Weeks in Treatment: 13 Encounter Discharge Information Items Post Procedure  Vitals Discharge Condition: Stable Temperature (F): 98.2 Ambulatory Status: Wheelchair Pulse (bpm): 88 Discharge Destination: Home Respiratory Rate (breaths/min): 18 Transportation: Private Auto Blood Pressure (mmHg): 115/73 Accompanied By: family member Schedule Follow-up Appointment: Yes Clinical Summary of Care: Patient Declined Electronic Signature(s) Signed: 05/05/2020 5:04:51 PM By: Kela Millin Entered By: Kela Millin on 05/05/2020 13:41:08 -------------------------------------------------------------------------------- Lower Extremity Assessment Details Patient Name: Date of Service: MARCELIS, WISSNER 05/05/2020 12:45 PM Medical Record Number: [id number] Patient Account Number: [id number] Date of Birth/Sex: Treating RN: 09-17-1950 (69 y.o. [name] Primary Care Azka Steger: Harvin Hazel M Other Clinician: Referring Deshayla Empson: Treating Wendee Hata/Extender: Jarold Song, Lyman Bishop M Weeks in Treatment: 13 Edema Assessment Assessed: Shirlyn Goltz: No] Patrice Paradise: No] Edema: [Left: N] [Right: o] Calf Left: Right: Point of Measurement: 41 cm From Medial Instep 35.5 cm Ankle Left: Right: Point of Measurement: 18 cm From Medial Instep 19.5 cm Vascular Assessment Pulses: Dorsalis Pedis Palpable: [Right:Yes] Electronic Signature(s) Signed: 05/05/2020 5:04:51 PM By: Kela Millin Entered By: Kela Millin on 05/05/2020 13:02:46 -------------------------------------------------------------------------------- Multi Wound Chart Details Patient Name: Date of Service: AAMIR, MCLINDEN [name] 05/05/2020 12:45 PM Medical Record Number: [id number] Patient Account Number: [id number] Date of Birth/Sex: Treating RN: [birth year] (69 y.o. [name]) Carlene Coria Primary Care Camry Theiss: Harvin Hazel M Other Clinician: Referring Sammy Douthitt: Treating Breland Trouten/Extender: Jarold Song, Lyman Bishop M Weeks in Treatment: 13 Vital Signs Height(in): 75 Pulse(bpm): 21 Weight(lbs):  145 Blood Pressure(mmHg): 115/73 Body Mass Index(BMI): 20 Temperature(F): 98.2 Respiratory Rate(breaths/min): 18 Photos: [1:No Photos Right, Lateral Foot] [N/A:N/A N/A] Wound Location: [1:Gradually Appeared] [N/A:N/A] Wounding Event: [1:Diabetic Wound/Ulcer of the Lower] [N/A:N/A] Primary Etiology: [1:Extremity Cataracts, Congestive Heart Failure,] [N/A:N/A] Comorbid History: [1:Hypertension, Myocardial Infarction, Hepatitis C, Type II Diabetes, End Stage Renal Disease, Dementia, Neuropathy 12/26/2019] [N/A:N/A] Date Acquired: [1:13] [N/A:N/A] Weeks of Treatment: [1:Open] [N/A:N/A] Wound Status: [1:1.2x1x0.5] [N/A:N/A] Measurements L x W x D (cm) [1:0.942] [N/A:N/A] A (cm) : rea [1:0.471] [N/A:N/A] Volume (cm) : [1:-500.00%] [N/A:N/A] % Reduction in  A rea: [1:-647.60%] [N/A:N/A] % Reduction in Volume: [1:12] Starting Position 1 (o'clock): [1:12] Ending Position 1 (o'clock): [1:0.4] Maximum Distance 1 (cm): [1:Yes] [N/A:N/A] Undermining: [1:Grade 2] [N/A:N/A] Classification: [1:Small] [N/A:N/A] Exudate A mount: [1:Serous] [N/A:N/A] Exudate Type: [1:amber] [N/A:N/A] Exudate Color: [1:Well defined, not attached] [N/A:N/A] Wound Margin: [1:Small (1-33%)] [N/A:N/A] Granulation A mount: [1:Pink, Pale] [N/A:N/A] Granulation Quality: [1:Large (67-100%)] [N/A:N/A] Necrotic A mount: [1:Eschar, Adherent Slough] [N/A:N/A] Necrotic Tissue: [1:Fat Layer (Subcutaneous Tissue): Yes N/A] Exposed Structures: [1:Fascia: No Tendon: No Muscle: No Joint: No Bone: No Small (1-33%)] [N/A:N/A] Epithelialization: [1:Debridement - Excisional] [N/A:N/A] Debridement: Pre-procedure Verification/Time Out 13:03 [N/A:N/A] Taken: [1:Lidocaine 5% topical ointment] [N/A:N/A] Pain Control: [1:Necrotic/Eschar, Subcutaneous,] [N/A:N/A] Tissue Debrided: [1:Slough Skin/Subcutaneous Tissue] [N/A:N/A] Level: [1:1.2] [N/A:N/A] Debridement A (sq cm): [1:rea Curette] [N/A:N/A] Instrument: [1:Moderate]  [N/A:N/A] Bleeding: [1:Pressure] [N/A:N/A] Hemostasis Achieved: [1:2] [N/A:N/A] Procedural Pain: [1:0] [N/A:N/A] Post Procedural Pain: Debridement Treatment Response: Procedure was tolerated well [N/A:N/A] Post Debridement Measurements L x 1.2x1x0.5 [N/A:N/A] W x D (cm) [1:0.471] [N/A:N/A] Post Debridement Volume: (cm) [1:Debridement] [N/A:N/A] Treatment Notes Electronic Signature(s) Signed: 05/05/2020 5:19:28 PM By: Linton Ham MD Signed: 05/05/2020 5:34:55 PM By: Carlene Coria RN Entered By: Linton Ham on 05/05/2020 13:28:13 -------------------------------------------------------------------------------- Multi-Disciplinary Care Plan Details Patient Name: Date of Service: ATOM, SOLIVAN [name] 05/05/2020 12:45 PM Medical Record Number: [id number] Patient Account Number: [id number] Date of Birth/Sex: Treating RN: 1951/07/15 (69 y.o. [name] Primary Care Niamya Vittitow: Harvin Hazel M Other Clinician: Referring Ciel Chervenak: Treating Myrick Mcnairy/Extender: Jarold Song, Lyman Bishop M Weeks in Treatment: 13 Active Inactive Wound/Skin Impairment Nursing Diagnoses: Knowledge deficit related to ulceration/compromised skin integrity Goals: Patient/caregiver will verbalize understanding of skin care regimen Date Initiated: 01/30/2020 Target Resolution Date: 05/15/2020 Goal Status: Active Ulcer/skin breakdown will have a volume reduction of 50% by week 8 Date Initiated: 01/30/2020 Date Inactivated: 02/18/2020 Target Resolution Date: 03/31/2020 Goal Status: Unmet Unmet Reason: comorbities Ulcer/skin breakdown will have a volume reduction of 80% by week 12 Date Initiated: 02/18/2020 Date Inactivated: 04/07/2020 Target Resolution Date: 03/20/2020 Goal Status: Unmet Unmet Reason: comorbities Ulcer/skin breakdown will heal within 14 weeks Date Initiated: 04/07/2020 Date Inactivated: 04/21/2020 Target Resolution Date: 05/07/2020 Goal Status: Unmet Unmet Reason:  comorbities Interventions: Assess patient/caregiver ability to obtain necessary supplies Assess patient/caregiver ability to perform ulcer/skin care regimen upon admission and as needed Provide education on ulcer and skin care Treatment Activities: Skin care regimen initiated : 01/30/2020 Topical wound management initiated : 01/30/2020 Notes: Electronic Signature(s) Signed: 05/05/2020 5:34:55 PM By: Carlene Coria RN Entered By: Carlene Coria on 05/05/2020 12:49:31 -------------------------------------------------------------------------------- Pain Assessment Details Patient Name: Date of Service: ERMIN, PARISIEN 05/05/2020 12:45 PM Medical Record Number: [id number] Patient Account Number: [id number] Date of Birth/Sex: Treating RN: [birth year] (69 y.o. [name] Primary Care Irlanda Croghan: Harvin Hazel M Other Clinician: Referring Doreene Forrey: Treating Merriam Brandner/Extender: Jarold Song, Lyman Bishop M Weeks in Treatment: 13 Active Problems Location of Pain Severity and Description of Pain Patient Has Paino No Site Locations Pain Management and Medication Current Pain Management: Electronic Signature(s) Signed: 05/05/2020 5:04:51 PM By: Kela Millin Entered By: Kela Millin on 05/05/2020 13:02:36 -------------------------------------------------------------------------------- Patient/Caregiver Education Details Patient Name: Date of Service: EBRAHIM, DEREMER [name] 9/28/2021andnbsp12:45 PM Medical Record Number: [id number] Patient Account Number: [id number] Date of Birth/Gender: Treating RN: 10-30-1950 (69 y.o. [name] Primary Care Physician: Harvin Hazel M Other Clinician: Referring Physician: Treating Physician/Extender: Jarold Song, [place] in Treatment: 13 Education Assessment Education Provided To: Patient Education Topics Provided Wound/Skin Impairment: Methods: Explain/Verbal Responses:  State content correctly Electronic  Signature(s) Signed: 05/05/2020 5:34:55 PM By: Carlene Coria RN Entered By: Carlene Coria on 05/05/2020 12:49:44 -------------------------------------------------------------------------------- Wound Assessment Details Patient Name: Date of Service: KRISTA, SOM 05/05/2020 12:45 PM Medical Record Number: [id number] Patient Account Number: [id number] Date of Birth/Sex: Treating RN: Dec 21, 1950 (69 y.o. [name] Primary Care Nicole Hafley: Harvin Hazel M Other Clinician: Referring Jiovani Mccammon: Treating Chalmer Zheng/Extender: Jarold Song, Lyman Bishop M Weeks in Treatment: 13 Wound Status Wound Number: 1 Primary Diabetic Wound/Ulcer of the Lower Extremity Etiology: Wound Location: Right, Lateral Foot Wound Open Wounding Event: Gradually Appeared Status: Date Acquired: 12/26/2019 Comorbid Cataracts, Congestive Heart Failure, Hypertension, Myocardial Weeks Of Treatment: 13 History: Infarction, Hepatitis C, Type II Diabetes, End Stage Renal Disease, Clustered Wound: No Dementia, Neuropathy Wound Measurements Length: (cm) 1.2 Width: (cm) 1 Depth: (cm) 0.5 Area: (cm) 0.942 Volume: (cm) 0.471 Wound Description Classification: Grade 2 Wound Margin: Well defined, not attached Exudate Amount: Small Exudate Type: Serous Exudate Color: amber Foul Odor After Cleansing: No Slough/Fibrino Yes % Reduction in Area: -500% % Reduction in Volume: -647.6% Epithelialization: Small (1-33%) Tunneling: No Undermining: Yes Starting Position (o'clock): 12 Ending Position (o'clock): 12 Maximum Distance: (cm) 0.4 Wound Bed Granulation Amount: Small (1-33%) Exposed Structure Granulation Quality: Pink, Pale Fascia Exposed: No Necrotic Amount: Large (67-100%) Fat Layer (Subcutaneous Tissue) Exposed: Yes Necrotic Quality: Eschar, Adherent Slough Tendon Exposed: No Muscle Exposed: No Joint Exposed: No Bone Exposed: No Treatment Notes Wound #1 (Right, Lateral Foot) 1. Cleanse With Wound  Cleanser 2. Periwound Care Skin Prep 3. Primary Dressing Applied Iodosorb 4. Secondary Dressing Dry Gauze Roll Gauze 5. Secured With Tape Notes Horticulturist, commercial) Signed: 05/05/2020 5:04:51 PM By: Kela Millin Entered By: Kela Millin on 05/05/2020 13:03:18 -------------------------------------------------------------------------------- Vitals Details Patient Name: Date of Service: REEVE, TURNLEY [name] 05/05/2020 12:45 PM Medical Record Number: [id number] Patient Account Number: [id number] Date of Birth/Sex: Treating RN: Jul 16, 1951 (68 y.o. [name] Primary Care Luella Gardenhire: Harvin Hazel M Other Clinician: Referring Janeshia Ciliberto: Treating Laurette Villescas/Extender: Jarold Song, Lyman Bishop M Weeks in Treatment: 13 Vital Signs Time Taken: 13:00 Temperature (F): 98.2 Height (in): 71 Pulse (bpm): 88 Weight (lbs): 145 Respiratory Rate (breaths/min): 18 Body Mass Index (BMI): 20.2 Blood Pressure (mmHg): 115/73 Reference Range: 80 - 120 mg / dl Electronic Signature(s) Signed: 05/05/2020 5:04:51 PM By: Kela Millin Entered By: Kela Millin on 05/05/2020 13:02:31

## 2020-05-12 ENCOUNTER — Encounter (HOSPITAL_BASED_OUTPATIENT_CLINIC_OR_DEPARTMENT_OTHER): Payer: Medicare Other | Attending: Internal Medicine | Admitting: Internal Medicine

## 2020-05-12 ENCOUNTER — Other Ambulatory Visit: Payer: Self-pay

## 2020-05-12 DIAGNOSIS — Z8616 Personal history of COVID-19: Secondary | ICD-10-CM | POA: Insufficient documentation

## 2020-05-12 DIAGNOSIS — N186 End stage renal disease: Secondary | ICD-10-CM | POA: Diagnosis not present

## 2020-05-12 DIAGNOSIS — L97411 Non-pressure chronic ulcer of right heel and midfoot limited to breakdown of skin: Secondary | ICD-10-CM | POA: Insufficient documentation

## 2020-05-12 DIAGNOSIS — Z992 Dependence on renal dialysis: Secondary | ICD-10-CM | POA: Insufficient documentation

## 2020-05-12 DIAGNOSIS — E1151 Type 2 diabetes mellitus with diabetic peripheral angiopathy without gangrene: Secondary | ICD-10-CM | POA: Diagnosis not present

## 2020-05-12 DIAGNOSIS — Z940 Kidney transplant status: Secondary | ICD-10-CM | POA: Insufficient documentation

## 2020-05-12 DIAGNOSIS — L97512 Non-pressure chronic ulcer of other part of right foot with fat layer exposed: Secondary | ICD-10-CM | POA: Diagnosis not present

## 2020-05-12 DIAGNOSIS — E11621 Type 2 diabetes mellitus with foot ulcer: Secondary | ICD-10-CM | POA: Diagnosis present

## 2020-05-12 DIAGNOSIS — E1122 Type 2 diabetes mellitus with diabetic chronic kidney disease: Secondary | ICD-10-CM | POA: Diagnosis not present

## 2020-05-12 NOTE — Progress Notes (Signed)
Jonathan [name], Jonathan [name] ([id number]) Visit Report for 05/12/2020 HPI Details Patient Name: Date of Service: Jonathan [name], Jonathan [name] 05/12/2020 2:00 PM Medical Record Number: [id number] Patient Account Number: [id number] Date of Birth/Sex: Treating RN: Jun 10, 1951 (69 y.o. Jonathan [name]) Jonathan [name] Primary Care Provider: Marton [name] Other Clinician: Referring Provider: Treating Provider/Extender: Jonathan [name] in Treatment: 14 History of Present Illness HPI Description: ADMISSION 01/30/2020 This is a 69 year old man who was hospitalized with Covid for a month in January 2021. Subsequently he was discharged to [facility] skilled facility for rehabilitation. His wife states that when he got home he had areas on the right lateral heel, right lateral fifth metatarsal head. They have been using Santyl to the right lateral heel no dressing on the right lateral foot. Our intake nurse discovered in small area on the right lateral great toe. He is minimally ambulatory at this point. He does not have a known history of PAD although he is a diabetic. 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 Still a small open area on the medial ffirth metatarsal head. But this is also better. He has not heard anything about his arterial studies that we ordered 7/13; patient's review with vein and vascular/noninvasive testing is not till 8/3. Still an open area on the right lateral first met head. The area on the right lateral heel remains closed  although it is callused 7/27; arterial studies on 8 3. Still has the open area on the right fifth metatarsal head. We have been using Hydrofera Blue 8/10-Comes a 2 weeks, had his vascular studies done, ABIs on both sides are reasonably good, TBI on the right is 145 Continuing with Hydrofera Blue, the wound is slightly larger than last time. 8/31; months since I last saw this man. We have been using Hydrofera Blue to the small remaining area on the right fifth metatarsal head. He did have arterial studies done on 8/3 and while these were not terrible they certainly were normal. He is noncompressible on the right but with triphasic waveforms and a TBI of 0.96 on the left he is noncompressible with monophasic waveforms at the PTA triphasic at the dorsalis pedis with a TBI of 0.59 9/14 this patient still has a punched-out area on the right fifth metatarsal head lateral aspect. This is very painful. I looked back on his arterial studies his ABIs were noncompressible but with triphasic waveforms and a TBI of 0.96. We have been using Iodoflex 9/27; this patient has a punched-out wound on the right fifth metatarsal head lateral aspect. Very painful. He is a minimal ambulator and very difficult to pick up on claudication. I have once again reviewed his arterial studies which were really not all that impressive. He is noncompressible on the right but he had triphasic waveforms at the PTA and DP and a great toe pressure of 145 with a TBI of 0.96 we have been using Iodoflex without any improvement 10/5; x-ray from last week suggested osteomyelitis showing lateral demineralization in the fifth metatarsal head. He will need a noncontrasted MRI secondary to severe chronic  renal failure last creatinine I see is 4.921 09/22/2019. We have been using Iodoflex Electronic Signature(s) Signed: 05/12/2020 5:35:49 PM By: Jonathan Najjar MD Entered By: Jonathan [name] on 05/12/2020  15:42:32 -------------------------------------------------------------------------------- Physical Exam Details Patient Name: Date of Service: Jonathan [name], Jonathan [name] 05/12/2020 2:00 PM Medical Record Number: [id number] Patient Account Number: [id number] Date of Birth/Sex: Treating RN: [birth year] (69 y.o. Jonathan [name] Primary Care Provider: Martha [name] Other Clinician: Referring Provider: Treating Provider/Extender: Mikki Santee in Treatment: 14 Constitutional Sitting or standing Blood Pressure is within target range for patient.. Pulse regular and within target range for patient.Marland Kitchen Respirations regular, non-labored and within target range.. Temperature is normal and within the target range for the patient.Marland Kitchen Appears in no distress. Cardiovascular Dorsalis pedis pulses vibrant on the right posterior tibial pulses also palpable. Notes Wound exam; right lateral fifth metatarsal head punched out wound with exposed denuded tendon. There is no viable surface here although I cannot probe to bone. I have done a PCR culture. Electronic Signature(s) Signed: 05/12/2020 5:35:49 PM By: Jonathan Najjar MD Entered By: Jonathan [name] on 05/12/2020 15:43:44 -------------------------------------------------------------------------------- Physician Orders Details Patient Name: Date of Service: Jonathan [name], Jonathan [name] 05/12/2020 2:00 PM Medical Record Number: [id number] Patient Account Number: [id number] Date of Birth/Sex: Treating RN: 1951/04/13 (69 y.o. Jonathan [name]) Yevonne Pax Primary Care Provider: Martha [name] Other Clinician: Referring Provider: Treating Provider/Extender: Mikki Santee in Treatment: 14 Verbal / Phone Orders: No Diagnosis Coding ICD-10 Coding Code Description E11.621 Type 2 diabetes mellitus with foot ulcer L97.512 Non-pressure chronic ulcer of other part of right foot with fat layer exposed L97.411 Non-pressure chronic ulcer of right heel and midfoot  limited to breakdown of skin E11.51 Type 2 diabetes mellitus with diabetic peripheral angiopathy without gangrene Follow-up Appointments Return Appointment in 2 weeks. Dressing Change Frequency Wound #1 Right,Lateral Foot Change Dressing every other day. Wound Cleansing Wound #1 Right,Lateral Foot May shower with protection. - use a cast protector on all other days not changing the dressings. May shower and wash wound with soap and water. - with dressing changes. Primary Wound Dressing Wound #1 Right,Lateral Foot Iodoflex Secondary Dressing Wound #1 Right,Lateral Foot Foam Border - or gauze and kerlix Off-Loading Open toe surgical shoe to: - right foot Other: - float right heel with pillows to offload pressure to heel while resting in chair and bed. Laboratory naerobe culture (MICRO) - non healing wound 5th metatarsal - (ICD10 E11.621 - Type 2 diabetes Bacteria identified in Unspecified specimen by A mellitus with foot ulcer) LOINC Code: 635-3 Convenience Name: Anerobic culture Radiology MRI, lower extremity without contrast - non healing wound right 5th metatarsal , no contrast due kidney function, - (ICD10 E11.621 - Type 2 diabetes mellitus with foot ulcer) Electronic Signature(s) Signed: 05/12/2020 5:35:40 PM By: Yevonne Pax RN Signed: 05/12/2020 5:35:49 PM By: Jonathan Najjar MD Entered By: Yevonne Pax on 05/12/2020 16:46:57 Prescription 05/12/2020 -------------------------------------------------------------------------------- Jannet Mantis MD Patient Name: Provider: 10/18/1950 [id number] Date of Birth: NPI#Jonathan [name] [id number] Sex: DEA #: [phone] Phone #: License #: Eligha Bridegroom [facility] Patient Address: [address] [place] [address] [place], [place] [address] [address] [place], [place] [address] [phone] Allergies No Known Allergies Provider's Orders MRI, lower extremity without contrast - ICD10:  E11.621 - non healing wound right 5th metatarsal , no contrast due kidney function, Hand Signature: Date(s): Electronic Signature(s) Signed: 05/12/2020 5:35:40 PM By: Yevonne Pax RN Signed: 05/12/2020 5:35:49 PM By: Jonathan Najjar MD Entered  ByCarlene [name] on 05/12/2020 16:46:57 -------------------------------------------------------------------------------- Problem List Details Patient Name: Date of Service: Jonathan [name], Jonathan [name] 05/12/2020 2:00 PM Medical Record Number: [id number] Patient Account Number: [id number] Date of Birth/Sex: Treating RN: 26-Oct-1950 (69 y.o. Jonathan [name]) Jonathan [name] Primary Care Provider: Marton [name] Other Clinician: Referring Provider: Treating Provider/Extender: Jonathan [name] in Treatment: 14 Active Problems ICD-10 Encounter Code Description Active Date MDM Diagnosis E11.621 Type 2 diabetes mellitus with foot ulcer 01/30/2020 No Yes L97.512 Non-pressure chronic ulcer of other part of right foot with fat layer exposed 01/30/2020 No Yes L97.411 Non-pressure chronic ulcer of right heel and midfoot limited to breakdown of 01/30/2020 No Yes skin E11.51 Type 2 diabetes mellitus with diabetic peripheral angiopathy without gangrene 01/30/2020 No Yes Inactive Problems Resolved Problems Electronic Signature(s) Signed: 05/12/2020 5:35:49 PM By: Linton Ham MD Entered By: Linton Ham on 05/12/2020 15:41:10 -------------------------------------------------------------------------------- Progress Note Details Patient Name: Date of Service: Jonathan [name], Jonathan [name] 05/12/2020 2:00 PM Medical Record Number: [id number] Patient Account Number: [id number] Date of Birth/Sex: Treating RN: May 17, 1951 (69 y.o. [name] Primary Care Provider: Marton [name] Other Clinician: Referring Provider: Treating Provider/Extender: Jonathan [name] in Treatment: 14 Subjective History of Present Illness (HPI) ADMISSION 01/30/2020 This is a  69 year old man who was hospitalized with Covid for a month in January 2021. Subsequently he was discharged to [facility] skilled facility for rehabilitation. His wife states that when he got home he had areas on the right lateral heel, right lateral fifth metatarsal head. They have been using Santyl to the right lateral heel no dressing on the right lateral foot. Our intake nurse discovered in small area on the right lateral great toe. He is minimally ambulatory at this point. He does not have a known history of PAD although he is a diabetic. 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 Still a small open area on the medial ffirth metatarsal head. But this is also better. He has not heard anything about his arterial studies that we ordered 7/13; patient's review with vein and vascular/noninvasive testing is not till 8/3. Still an open area on the right lateral first met head. The area on the right lateral heel remains closed although it is callused 7/27; arterial studies on 8 3. Still has the open area on the right fifth metatarsal head. We have been using Hydrofera Blue 8/10-Comes a 2 weeks, had his vascular studies done, ABIs on both sides are reasonably good, TBI on the right is 145 Continuing with Hydrofera Blue, the wound is slightly larger than last time. 8/31; months since I last saw this man. We have been using Hydrofera Blue to the small remaining area on the right fifth metatarsal head. He did have  arterial studies done on 8/3 and while these were not terrible they certainly were normal. He is noncompressible on the right but with triphasic waveforms and a TBI of 0.96 on the left he is noncompressible with monophasic waveforms at the PTA triphasic at the dorsalis pedis with a TBI of 0.59 9/14 this patient still has a  punched-out area on the right fifth metatarsal head lateral aspect. This is very painful. I looked back on his arterial studies his ABIs were noncompressible but with triphasic waveforms and a TBI of 0.96. We have been using Iodoflex 9/27; this patient has a punched-out wound on the right fifth metatarsal head lateral aspect. Very painful. He is a minimal ambulator and very difficult to pick up on claudication. I have once again reviewed his arterial studies which were really not all that impressive. He is noncompressible on the right but he had triphasic waveforms at the PTA and DP and a great toe pressure of 145 with a TBI of 0.96 we have been using Iodoflex without any improvement 10/5; x-ray from last week suggested osteomyelitis showing lateral demineralization in the fifth metatarsal head. He will need a noncontrasted MRI secondary to severe chronic renal failure last creatinine I see is 4.921 09/22/2019. We have been using Iodoflex Objective Constitutional Sitting or standing Blood Pressure is within target range for patient.. Pulse regular and within target range for patient.Marland Kitchen Respirations regular, non-labored and within target range.. Temperature is normal and within the target range for the patient.Marland Kitchen Appears in no distress. Vitals Time Taken: 1:58 PM, Height: 71 in, Source: Stated, Weight: 145 lbs, Source: Stated, BMI: 20.2, Temperature: 98.3 F, Pulse: 87 bpm, Respiratory Rate: 18 breaths/min, Blood Pressure: 118/67 mmHg. General Notes: has not checked BS in a few days Cardiovascular Dorsalis pedis pulses vibrant on the right posterior tibial pulses also  palpable. General Notes: Wound exam; right lateral fifth metatarsal head punched out wound with exposed denuded tendon. There is no viable surface here although I cannot probe to bone. I have done a PCR culture. Integumentary (Hair, Skin) Wound #1 status is Open. Original cause of wound was Gradually Appeared. The wound is located on the Right,Lateral Foot. The wound measures 1.1cm length x 1cm width x 0.5cm depth; 0.864cm^2 area and 0.432cm^3 volume. There is tendon and Fat Layer (Subcutaneous Tissue) exposed. There is no tunneling noted, however, there is undermining starting at 12:00 and ending at 12:00 with a maximum distance of 0.5cm. There is a small amount of purulent drainage noted. The wound margin is well defined and not attached to the wound base. There is no granulation within the wound bed. There is a large (67-100%) amount of necrotic tissue within the wound bed including Adherent Slough. 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 Dressing Change Frequency: Wound #1 Right,Lateral Foot: Change Dressing every other day. Wound Cleansing: Wound #1 Right,Lateral Foot: May shower with protection. - use a cast protector on all other days not changing the dressings. May shower and wash wound with soap and water. - with dressing changes. Primary Wound Dressing: Wound #1 Right,Lateral Foot: Iodoflex Secondary Dressing: Wound #1 Right,Lateral Foot: Foam Border - or gauze and kerlix Off-Loading: Open toe surgical shoe to: - right foot Other: - float right heel with pillows to offload pressure to heel while resting in chair and bed. Radiology ordered  were: MRI, lower extremity without contrast - non healing wound right 5th metatarsal , no contrast due kidney function, Laboratory ordered were: Anerobic culture, PCR, right 5th metatarsil - non healing wound 5th metatarsal 1. This wound is not  doing well. 2. In response to the x-ray results I have ordered an MRI of the foot without contrast secondary to severe chronic renal failure 3. I have done a PCR culture but no empiric antibiotics. He does not have any palpable bone 4. I am continue with Iodoflex which is helped clean up the surface of this wound. Awaiting for the MRI culture result Electronic Signature(s) Signed: 05/12/2020 5:35:49 PM By: Linton Ham MD Entered By: Linton Ham on 05/12/2020 15:44:47 -------------------------------------------------------------------------------- SuperBill Details Patient Name: Date of Service: ZEIN, HELBING [name] 05/12/2020 Medical Record Number: [id number] Patient Account Number: [id number] Date of Birth/Sex: Treating RN: [birth year] (69 y.o. Jonathan [name]) Jonathan [name] Primary Care Provider: Marton [name] Other Clinician: Referring Provider: Treating Provider/Extender: Jonathan [name] in Treatment: 14 Diagnosis Coding ICD-10 Codes Code Description E11.621 Type 2 diabetes mellitus with foot ulcer L97.512 Non-pressure chronic ulcer of other part of right foot with fat layer exposed L97.411 Non-pressure chronic ulcer of right heel and midfoot limited to breakdown of skin E11.51 Type 2 diabetes mellitus with diabetic peripheral angiopathy without gangrene Facility Procedures CPT4 Code: [id number] Description: 99213 - WOUND CARE VISIT-LEV 3 EST PT Modifier: Quantity: 1 Physician Procedures : CPT4 Code Description Modifier [id number] - WC PHYS LEVEL 4 - EST PT ICD-10 Diagnosis Description E11.621 Type 2 diabetes mellitus with foot ulcer L97.512 Non-pressure chronic ulcer of other part of right foot with fat layer exposed Quantity:  1 Electronic Signature(s) Signed: 05/12/2020 5:35:40 PM By: Jonathan Coria RN Signed: 05/12/2020 5:35:49 PM By: Linton Ham MD Entered By: Jonathan [name] on 05/12/2020 16:50:12

## 2020-05-12 NOTE — Progress Notes (Addendum)
Jonathan [name] ([id number]) Visit Report for 05/12/2020 Arrival Information Details Patient Name: Date of Service: Jonathan [name], Jonathan [name] 05/12/2020 2:00 PM Medical Record Number: [id number] Patient Account Number: [id number] Date of Birth/Sex: Treating RN: [birth year] (69 y.o. Jonathan [name] Primary Care Nusaybah Ivie: Marton Redwood Other Clinician: Referring Fran Mcree: Treating Otisha Spickler/Extender: Estelle Grumbles in Treatment: 24 Visit Information History Since Last Visit Added or deleted any medications: Yes Patient Arrived: Wheel Chair Any new allergies or adverse reactions: No Arrival Time: 13:57 Had a fall or experienced change in No Accompanied By: spouse activities of daily living that may affect Transfer Assistance: None risk of falls: Patient Identification Verified: Yes Signs or symptoms of abuse/neglect since No Secondary Verification Process Completed: Yes last visito Patient Requires Transmission-Based Precautions: No Hospitalized since last visit: No Patient Has Alerts: Yes Implantable device outside of the clinic No Patient Alerts: Patient on Blood Thinner excluding Right ABI: non compress cellular tissue based products placed in the center since last visit: Has Dressing in Place as Prescribed: Yes Has Footwear/Offloading in Place as Yes Prescribed: Left: Surgical Shoe with Pressure Relief Insole Pain Present Now: No Electronic Signature(s) Signed: 05/12/2020 5:49:38 PM By: Baruch Gouty RN, BSN Entered By: Baruch Gouty on 05/12/2020 13:58:04 -------------------------------------------------------------------------------- Clinic Level of Care Assessment Details Patient Name: Date of Service: WAH, SABIC 05/12/2020 2:00 PM Medical Record Number: [id number] Patient Account Number: [id number] Date of Birth/Sex: Treating RN: 04/06/1951 (69 y.o. Jerilynn [name]) Carlene Coria Primary Care Julyssa Kyer: Marton Redwood Other Clinician: Referring Shann Lewellyn: Treating  Iva Montelongo/Extender: Estelle Grumbles in Treatment: 14 Clinic Level of Care Assessment Items TOOL 4 Quantity Score X- 1 0 Use when only an EandM is performed on FOLLOW-UP visit ASSESSMENTS - Nursing Assessment / Reassessment X- 1 10 Reassessment of Co-morbidities (includes updates in patient status) X- 1 5 Reassessment of Adherence to Treatment Plan ASSESSMENTS - Wound and Skin A ssessment / Reassessment X - Simple Wound Assessment / Reassessment - one wound 1 5 []  - 0 Complex Wound Assessment / Reassessment - multiple wounds []  - 0 Dermatologic / Skin Assessment (not related to wound area) ASSESSMENTS - Focused Assessment []  - 0 Circumferential Edema Measurements - multi extremities []  - 0 Nutritional Assessment / Counseling / Intervention []  - 0 Lower Extremity Assessment (monofilament, tuning fork, pulses) []  - 0 Peripheral Arterial Disease Assessment (using hand held doppler) ASSESSMENTS - Ostomy and/or Continence Assessment and Care []  - 0 Incontinence Assessment and Management []  - 0 Ostomy Care Assessment and Management (repouching, etc.) PROCESS - Coordination of Care X - Simple Patient / Family Education for ongoing care 1 15 []  - 0 Complex (extensive) Patient / Family Education for ongoing care X- 1 10 Staff obtains Programmer, systems, Records, T Results / Process Orders est []  - 0 Staff telephones HHA, Nursing Homes / Clarify orders / etc []  - 0 Routine Transfer to another Facility (non-emergent condition) []  - 0 Routine Hospital Admission (non-emergent condition) []  - 0 New Admissions / Biomedical engineer / Ordering NPWT Apligraf, etc. , []  - 0 Emergency Hospital Admission (emergent condition) X- 1 10 Simple Discharge Coordination []  - 0 Complex (extensive) Discharge Coordination PROCESS - Special Needs []  - 0 Pediatric / Minor Patient Management []  - 0 Isolation Patient Management []  - 0 Hearing / Language / Visual special  needs []  - 0 Assessment of Community assistance (transportation, D/C planning, etc.) []  - 0 Additional assistance / Altered mentation []  - 0 Support Surface(s) Assessment (bed, cushion, seat, etc.) INTERVENTIONS - Wound  Cleansing / Measurement X - Simple Wound Cleansing - one wound 1 5 []  - 0 Complex Wound Cleansing - multiple wounds X- 1 5 Wound Imaging (photographs - any number of wounds) []  - 0 Wound Tracing (instead of photographs) X- 1 5 Simple Wound Measurement - one wound []  - 0 Complex Wound Measurement - multiple wounds INTERVENTIONS - Wound Dressings []  - 0 Small Wound Dressing one or multiple wounds X- 1 15 Medium Wound Dressing one or multiple wounds []  - 0 Large Wound Dressing one or multiple wounds X- 1 5 Application of Medications - topical []  - 0 Application of Medications - injection INTERVENTIONS - Miscellaneous []  - 0 External ear exam []  - 0 Specimen Collection (cultures, biopsies, blood, body fluids, etc.) []  - 0 Specimen(s) / Culture(s) sent or taken to Lab for analysis []  - 0 Patient Transfer (multiple staff / Civil Service fast streamer / Similar devices) []  - 0 Simple Staple / Suture removal (25 or less) []  - 0 Complex Staple / Suture removal (26 or more) []  - 0 Hypo / Hyperglycemic Management (close monitor of Blood Glucose) []  - 0 Ankle / Brachial Index (ABI) - do not check if billed separately X- 1 5 Vital Signs Has the patient been seen at the hospital within the last three years: Yes Total Score: 95 Level Of Care: New/Established - Level 3 Electronic Signature(s) Signed: 05/12/2020 5:35:40 PM By: Carlene Coria RN Entered By: Carlene Coria on 05/12/2020 16:49:55 -------------------------------------------------------------------------------- Complex / Palliative Patient Assessment Details Patient Name: Date of Service: Jonathan [name] 05/12/2020 2:00 PM Medical Record Number: [id number] Patient Account Number: [id number] Date of Birth/Sex: Treating  RN: Nov 07, 1950 (69 y.o. [name] Primary Care Adham Johnson: Marton Redwood Other Clinician: Referring Camera Krienke: Treating Lus Kriegel/Extender: Estelle Grumbles in Treatment: 14 Palliative Management Criteria Complex Wound Management Criteria Patient has remarkable or complex co-morbidities requiring medications or treatments that extend wound healing times. Examples: Diabetes mellitus with chronic renal failure or end stage renal disease requiring dialysis Advanced or poorly controlled rheumatoid arthritis Diabetes mellitus and end stage chronic obstructive pulmonary disease Active cancer with current chemo- or radiation therapy Type 2 Diabetes, ESRD on dialysis, CHF Care Approach Wound Care Plan: Complex Wound Management Electronic Signature(s) Signed: 05/15/2020 5:41:32 PM By: Levan Hurst RN, BSN Signed: 05/18/2020 5:03:49 PM By: Linton Ham MD Entered By: Levan Hurst on 05/15/2020 10:27:34 -------------------------------------------------------------------------------- Encounter Discharge Information Details Patient Name: Date of Service: AREK, SPADAFORE [name] 05/12/2020 2:00 PM Medical Record Number: [id number] Patient Account Number: [id number] Date of Birth/Sex: Treating RN: 09-03-1950 (69 y.o. [name] Primary Care Kathyjo Briere: Marton Redwood Other Clinician: Referring Rani Idler: Treating Devansh Riese/Extender: Estelle Grumbles in Treatment: 14 Encounter Discharge Information Items Discharge Condition: Stable Ambulatory Status: Wheelchair Discharge Destination: Home Transportation: Private Auto Accompanied By: wife Schedule Follow-up Appointment: Yes Clinical Summary of Care: Patient Declined Electronic Signature(s) Signed: 05/12/2020 5:16:50 PM By: Kela Millin Entered By: Kela Millin on 05/12/2020 15:17:57 -------------------------------------------------------------------------------- Lower Extremity Assessment  Details Patient Name: Date of Service: KNUT, RONDINELLI 05/12/2020 2:00 PM Medical Record Number: [id number] Patient Account Number: [id number] Date of Birth/Sex: Treating RN: 11/28/1950 (69 y.o. Jonathan [name] Primary Care Franziska Podgurski: Marton Redwood Other Clinician: Referring Jared Cahn: Treating Marche Hottenstein/Extender: Estelle Grumbles in Treatment: 14 Edema Assessment Assessed: Shirlyn Goltz: No] Patrice Paradise: No] Edema: [Left: N] [Right: o] Calf Left: Right: Point of Measurement: 41 cm From Medial Instep 35.5 cm Ankle Left: Right: Point of Measurement: 18 cm From Medial Instep 19.5 cm Vascular  Assessment Pulses: Dorsalis Pedis Palpable: [Right:Yes] Electronic Signature(s) Signed: 05/12/2020 5:49:38 PM By: Baruch Gouty RN, BSN Entered By: Baruch Gouty on 05/12/2020 14:05:16 -------------------------------------------------------------------------------- Multi Wound Chart Details Patient Name: Date of Service: TREW, SUNDE [name] 05/12/2020 2:00 PM Medical Record Number: [id number] Patient Account Number: [id number] Date of Birth/Sex: Treating RN: 1950-10-08 (69 y.o. Jerilynn [name]) Carlene Coria Primary Care Armanii Pressnell: Marton Redwood Other Clinician: Referring Ariadne Rissmiller: Treating Amanada Philbrick/Extender: Estelle Grumbles in Treatment: 14 Vital Signs Height(in): 22 Pulse(bpm): 43 Weight(lbs): 145 Blood Pressure(mmHg): 118/67 Body Mass Index(BMI): 20 Temperature(F): 98.3 Respiratory Rate(breaths/min): 18 Photos: [1:No Photos Right, Lateral Foot] [N/A:N/A N/A] Wound Location: [1:Gradually Appeared] [N/A:N/A] Wounding Event: [1:Diabetic Wound/Ulcer of the Lower] [N/A:N/A] Primary Etiology: [1:Extremity Cataracts, Congestive Heart Failure, N/A] Comorbid History: [1:Hypertension, Myocardial Infarction, Hepatitis C, Type II Diabetes, End Stage Renal Disease, Dementia, Neuropathy 12/26/2019] [N/A:N/A] Date Acquired: [1:14] [N/A:N/A] Weeks of Treatment: [1:Open]  [N/A:N/A] Wound Status: [1:1.1x1x0.5] [N/A:N/A] Measurements L x W x D (cm) [1:0.864] [N/A:N/A] A (cm) : rea [1:0.432] [N/A:N/A] Volume (cm) : [1:-450.30%] [N/A:N/A] % Reduction in A rea: [1:-585.70%] [N/A:N/A] % Reduction in Volume: [1:12] Starting Position 1 (o'clock): [1:12] Ending Position 1 (o'clock): [1:0.5] Maximum Distance 1 (cm): [1:Yes] [N/A:N/A] Undermining: [1:Grade 2] [N/A:N/A] Classification: [1:Small] [N/A:N/A] Exudate A mount: [1:Purulent] [N/A:N/A] Exudate Type: [1:yellow, brown, green] [N/A:N/A] Exudate Color: [1:Well defined, not attached] [N/A:N/A] Wound Margin: [1:None Present (0%)] [N/A:N/A] Granulation A mount: [1:Large (67-100%)] [N/A:N/A] Necrotic A mount: [1:Fat Layer (Subcutaneous Tissue): Yes N/A] Exposed Structures: [1:Tendon: Yes Fascia: No Muscle: No Joint: No Bone: No Small (1-33%)] [N/A:N/A] Treatment Notes Wound #1 (Right, Lateral Foot) 1. Cleanse With Wound Cleanser 3. Primary Dressing Applied Iodoflex 4. Secondary Dressing Foam Border Dressing Electronic Signature(s) Signed: 05/12/2020 5:35:40 PM By: Carlene Coria RN Signed: 05/12/2020 5:35:49 PM By: Linton Ham MD Entered By: Linton Ham on 05/12/2020 15:41:20 -------------------------------------------------------------------------------- [place] Details Patient Name: Date of Service: TYLIEK, TIMBERMAN [name] 05/12/2020 2:00 PM Medical Record Number: [id number] Patient Account Number: [id number] Date of Birth/Sex: Treating RN: 05-12-1951 (69 y.o. Jerilynn [name]) Carlene Coria Primary Care Cebastian Neis: Marton Redwood Other Clinician: Referring Maribell Demeo: Treating Maikayla Beggs/Extender: Estelle Grumbles in Treatment: 14 Active Inactive Wound/Skin Impairment Nursing Diagnoses: Knowledge deficit related to ulceration/compromised skin integrity Goals: Patient/caregiver will verbalize understanding of skin care regimen Date Initiated: 01/30/2020 Target Resolution Date:  05/15/2020 Goal Status: Active Ulcer/skin breakdown will have a volume reduction of 50% by week 8 Date Initiated: 01/30/2020 Date Inactivated: 02/18/2020 Target Resolution Date: 03/31/2020 Goal Status: Unmet Unmet Reason: comorbities Ulcer/skin breakdown will have a volume reduction of 80% by week 12 Date Initiated: 02/18/2020 Date Inactivated: 04/07/2020 Target Resolution Date: 03/20/2020 Goal Status: Unmet Unmet Reason: comorbities Ulcer/skin breakdown will heal within 14 weeks Date Initiated: 04/07/2020 Date Inactivated: 04/21/2020 Target Resolution Date: 05/07/2020 Goal Status: Unmet Unmet Reason: comorbities Interventions: Assess patient/caregiver ability to obtain necessary supplies Assess patient/caregiver ability to perform ulcer/skin care regimen upon admission and as needed Provide education on ulcer and skin care Treatment Activities: Skin care regimen initiated : 01/30/2020 Topical wound management initiated : 01/30/2020 Notes: Electronic Signature(s) Signed: 05/12/2020 5:35:40 PM By: Carlene Coria RN Entered By: Carlene Coria on 05/12/2020 14:59:34 -------------------------------------------------------------------------------- Pain Assessment Details Patient Name: Date of Service: ZONG, MCQUARRIE 05/12/2020 2:00 PM Medical Record Number: [id number] Patient Account Number: [id number] Date of Birth/Sex: Treating RN: 10-06-1950 (69 y.o. Jonathan [name] Primary Care Chaske Paskett: Marton Redwood Other Clinician: Referring Lenae Wherley: Treating Devan Danzer/Extender: Estelle Grumbles in Treatment: 14 Active Problems Location of Pain  Severity and Description of Pain Patient Has Paino No Site Locations Rate the pain. Current Pain Level: 0 Pain Management and Medication Current Pain Management: Electronic Signature(s) Signed: 05/12/2020 5:49:38 PM By: Baruch Gouty RN, BSN Entered By: Baruch Gouty on 05/12/2020  13:59:54 -------------------------------------------------------------------------------- Patient/Caregiver Education Details Patient Name: Date of Service: NUNO, BRUBACHER [name] 10/5/2021andnbsp2:00 PM Medical Record Number: [id number] Patient Account Number: [id number] Date of Birth/Gender: Treating RN: Feb 07, 1951 (69 y.o. Jerilynn [name]) Carlene Coria Primary Care Physician: Marton Redwood Other Clinician: Referring Physician: Treating Physician/Extender: Estelle Grumbles in Treatment: 14 Education Assessment Education Provided To: Patient Education Topics Provided Wound/Skin Impairment: Methods: Explain/Verbal Responses: State content correctly Electronic Signature(s) Signed: 05/12/2020 5:35:40 PM By: Carlene Coria RN Entered By: Carlene Coria on 05/12/2020 15:00:05 -------------------------------------------------------------------------------- Wound Assessment Details Patient Name: Date of Service: SANTANNA, OLENIK 05/12/2020 2:00 PM Medical Record Number: [id number] Patient Account Number: [id number] Date of Birth/Sex: Treating RN: 21-Oct-1950 (69 y.o. Jonathan [name] Primary Care Esco Joslyn: Marton Redwood Other Clinician: Referring Gerlad Pelzel: Treating Mccoy Testa/Extender: Estelle Grumbles in Treatment: 14 Wound Status Wound Number: 1 Primary Diabetic Wound/Ulcer of the Lower Extremity Etiology: Wound Location: Right, Lateral Foot Wound Open Wounding Event: Gradually Appeared Status: Date Acquired: 12/26/2019 Comorbid Cataracts, Congestive Heart Failure, Hypertension, Myocardial Weeks Of Treatment: 14 History: Infarction, Hepatitis C, Type II Diabetes, End Stage Renal Disease, Clustered Wound: No Dementia, Neuropathy Photos Photo Uploaded By: Mikeal Hawthorne on 05/14/2020 11:54:00 Wound Measurements Length: (cm) 1.1 Width: (cm) 1 Depth: (cm) 0.5 Area: (cm) 0.864 Volume: (cm) 0.432 % Reduction in Area: -450.3% % Reduction in Volume:  -585.7% Epithelialization: Small (1-33%) Tunneling: No Undermining: Yes Starting Position (o'clock): 12 Ending Position (o'clock): 12 Maximum Distance: (cm) 0.5 Wound Description Classification: Grade 2 Wound Margin: Well defined, not attached Exudate Amount: Small Exudate Type: Purulent Exudate Color: yellow, brown, green Foul Odor After Cleansing: No Slough/Fibrino Yes Wound Bed Granulation Amount: None Present (0%) Exposed Structure Necrotic Amount: Large (67-100%) Fascia Exposed: No Necrotic Quality: Adherent Slough Fat Layer (Subcutaneous Tissue) Exposed: Yes Tendon Exposed: Yes Muscle Exposed: No Joint Exposed: No Bone Exposed: No Treatment Notes Wound #1 (Right, Lateral Foot) 1. Cleanse With Wound Cleanser 3. Primary Dressing Applied Iodoflex 4. Secondary Dressing Foam Border Dressing Electronic Signature(s) Signed: 05/12/2020 5:49:38 PM By: Baruch Gouty RN, BSN Entered By: Baruch Gouty on 05/12/2020 14:07:13 -------------------------------------------------------------------------------- [place] Details Patient Name: Date of Service: NIJEE, HEATWOLE [name] 05/12/2020 2:00 PM Medical Record Number: [id number] Patient Account Number: [id number] Date of Birth/Sex: Treating RN: Dec 19, 1950 (69 y.o. Jonathan [name] Primary Care Daryle Boyington: Marton Redwood Other Clinician: Referring Oz Gammel: Treating Curran Lenderman/Extender: Estelle Grumbles in Treatment: 14 Vital Signs Time Taken: 13:58 Temperature (F): 98.3 Height (in): 71 Pulse (bpm): 87 Source: Stated Respiratory Rate (breaths/min): 18 Weight (lbs): 145 Blood Pressure (mmHg): 118/67 Source: Stated Reference Range: 80 - 120 mg / dl Body Mass Index (BMI): 20.2 Notes has not checked BS in a few days Electronic Signature(s) Signed: 05/12/2020 5:49:38 PM By: Baruch Gouty RN, BSN Entered By: Baruch Gouty on 05/12/2020 13:59:47

## 2020-05-13 ENCOUNTER — Other Ambulatory Visit (HOSPITAL_COMMUNITY): Payer: Self-pay | Admitting: Internal Medicine

## 2020-05-13 ENCOUNTER — Other Ambulatory Visit: Payer: Self-pay | Admitting: Internal Medicine

## 2020-05-13 DIAGNOSIS — L97529 Non-pressure chronic ulcer of other part of left foot with unspecified severity: Secondary | ICD-10-CM

## 2020-05-13 DIAGNOSIS — E11621 Type 2 diabetes mellitus with foot ulcer: Secondary | ICD-10-CM

## 2020-05-26 ENCOUNTER — Encounter (HOSPITAL_BASED_OUTPATIENT_CLINIC_OR_DEPARTMENT_OTHER): Payer: Medicare Other | Admitting: Internal Medicine

## 2020-05-26 ENCOUNTER — Other Ambulatory Visit: Payer: Self-pay

## 2020-05-26 DIAGNOSIS — E11621 Type 2 diabetes mellitus with foot ulcer: Secondary | ICD-10-CM | POA: Diagnosis not present

## 2020-05-26 NOTE — Progress Notes (Signed)
BASCOM, BIEL ([id number]) Visit Report for 05/26/2020 Arrival Information Details Patient Name: Date of Service: Jonathan [name], Jonathan [name] 05/26/2020 2:00 PM Medical Record Number: [id number] Patient Account Number: [id number] Date of Birth/Sex: Treating RN: [birth year] (69 y.o. Jonathan [name], Jonathan [name] Primary Care Thia Olesen: Marton Redwood Other Clinician: Referring Larenda Reedy: Treating Claryce Friel/Extender: Estelle Grumbles in Treatment: 10 Visit Information History Since Last Visit Added or deleted any medications: No Patient Arrived: Wheel Chair Any new allergies or adverse reactions: No Arrival Time: 14:24 Had a fall or experienced change in No Accompanied By: wife activities of daily living that may affect Transfer Assistance: None risk of falls: Patient Identification Verified: Yes Signs or symptoms of abuse/neglect since last visito No Secondary Verification Process Completed: Yes Hospitalized since last visit: No Patient Requires Transmission-Based Precautions: No Implantable device outside of the clinic excluding No Patient Has Alerts: Yes cellular tissue based products placed in the center Patient Alerts: Patient on Blood Thinner since last visit: Right ABI: non compress Has Dressing in Place as Prescribed: Yes Pain Present Now: No Electronic Signature(s) Signed: 05/26/2020 3:24:41 PM By: Sandre Kitty Entered By: Sandre Kitty on 05/26/2020 14:24:21 -------------------------------------------------------------------------------- Encounter Discharge Information Details Patient Name: Date of Service: Jonathan [name], Jonathan [name] 05/26/2020 2:00 PM Medical Record Number: [id number] Patient Account Number: [id number] Date of Birth/Sex: Treating RN: Jul 10, 1951 (69 y.o. Jonathan [name] Primary Care Yuki Purves: Marton Redwood Other Clinician: Referring Yussuf Sawyers: Treating Barney Russomanno/Extender: Estelle Grumbles in Treatment: 16 Encounter Discharge Information Items  Post Procedure Vitals Discharge Condition: Stable Temperature (F): 97.9 Ambulatory Status: Walker Pulse (bpm): 85 Discharge Destination: Home Respiratory Rate (breaths/min): 18 Transportation: Private Auto Blood Pressure (mmHg): 151/78 Accompanied By: wife Schedule Follow-up Appointment: Yes Clinical Summary of Care: Patient Declined Electronic Signature(s) Signed: 05/26/2020 5:04:42 PM By: Levan Hurst RN, BSN Entered By: Levan Hurst on 05/26/2020 15:22:07 -------------------------------------------------------------------------------- Lower Extremity Assessment Details Patient Name: Date of Service: Jonathan [name], Jonathan [name] 05/26/2020 2:00 PM Medical Record Number: [id number] Patient Account Number: [id number] Date of Birth/Sex: Treating RN: 1951/04/08 (69 y.o. Jonathan [name] Primary Care Eliezer Khawaja: Marton Redwood Other Clinician: Referring Awanda Wilcock: Treating Fitzpatrick Alberico/Extender: Estelle Grumbles in Treatment: 16 Edema Assessment Assessed: Jonathan [name]: No] Jonathan [name]: No] Edema: [Left: N] [Right: o] Calf Left: Right: Point of Measurement: 41 cm From Medial Instep 35.5 cm Ankle Left: Right: Point of Measurement: 18 cm From Medial Instep 19.5 cm Vascular Assessment Pulses: Dorsalis Pedis Palpable: [Right:No] Electronic Signature(s) Signed: 05/26/2020 4:56:50 PM By: Baruch Gouty RN, BSN Entered By: Baruch Gouty on 05/26/2020 14:37:01 -------------------------------------------------------------------------------- Multi Wound Chart Details Patient Name: Date of Service: Jonathan [name], Jonathan [name] 05/26/2020 2:00 PM Medical Record Number: [id number] Patient Account Number: [id number] Date of Birth/Sex: Treating RN: 07-04-1951 (69 y.o. Jonathan [name], Jonathan [name] Primary Care Jakari Sada: Marton Redwood Other Clinician: Referring Damica Gravlin: Treating Kelis Plasse/Extender: Estelle Grumbles in Treatment: 16 Vital Signs Height(in): 71 Pulse(bpm): 56 Weight(lbs):  145 Blood Pressure(mmHg): 151/78 Body Mass Index(BMI): 20 Temperature(F): 97.9 Respiratory Rate(breaths/min): 18 Photos: [1:No Photos Right, Lateral Foot] [N/A:N/A N/A] Wound Location: [1:Gradually Appeared] [N/A:N/A] Wounding Event: [1:Diabetic Wound/Ulcer of the Lower] [N/A:N/A] Primary Etiology: [1:Extremity Cataracts, Congestive Heart Failure,] [N/A:N/A] Comorbid History: [1:Hypertension, Myocardial Infarction, Hepatitis C, Type II Diabetes, End Stage Renal Disease, Dementia, Neuropathy 12/26/2019] [N/A:N/A] Date Acquired: [1:16] [N/A:N/A] Weeks of Treatment: [1:Open] [N/A:N/A] Wound Status: [1:1x1x0.5] [N/A:N/A] Measurements L x W x D (cm) [1:0.785] [N/A:N/A] A (cm) : rea [1:0.393] [N/A:N/A] Volume (cm) : [1:-400.00%] [N/A:N/A] % Reduction in A rea: [1:-523.80%] [N/A:N/A] % Reduction in Volume: [1:12]  Starting Position 1 (o'clock): [1:12] Ending Position 1 (o'clock): [1:0.4] Maximum Distance 1 (cm): [1:Yes] [N/A:N/A] Undermining: [1:Grade 2] [N/A:N/A] Classification: [1:Small] [N/A:N/A] Exudate A mount: [1:Serous] [N/A:N/A] Exudate Type: [1:amber] [N/A:N/A] Exudate Color: [1:Well defined, not attached] [N/A:N/A] Wound Margin: [1:None Present (0%)] [N/A:N/A] Granulation A mount: [1:Large (67-100%)] [N/A:N/A] Necrotic A mount: [1:Fat Layer (Subcutaneous Tissue): Yes N/A] Exposed Structures: [1:Tendon: Yes Fascia: No Muscle: No Joint: No Bone: No Small (1-33%)] [N/A:N/A] Epithelialization: [1:Debridement - Excisional] [N/A:N/A] Debridement: Pre-procedure Verification/Time Out 14:50 [N/A:N/A] Taken: [1:Lidocaine 4% Topical Solution] [N/A:N/A] Pain Control: [1:Callus, Subcutaneous, Slough] [N/A:N/A] Tissue Debrided: [1:Skin/Subcutaneous Tissue] [N/A:N/A] Level: [1:2.25] [N/A:N/A] Debridement A (sq cm): [1:rea Curette] [N/A:N/A] Instrument: [1:Minimum] [N/A:N/A] Bleeding: [1:Pressure] [N/A:N/A] Hemostasis A chieved: [1:0] [N/A:N/A] Procedural Pain: [1:Procedure was  tolerated well] [N/A:N/A] Debridement Treatment Response: [1:1x1x0.4] [N/A:N/A] Post Debridement Measurements L x W x D (cm) [1:0.314] [N/A:N/A] Post Debridement Volume: (cm) [1:Debridement] [N/A:N/A] Treatment Notes Electronic Signature(s) Signed: 05/26/2020 4:22:08 PM By: Linton Ham MD Signed: 05/26/2020 5:04:07 PM By: Deon Pilling Entered By: Linton Ham on 05/26/2020 15:07:15 -------------------------------------------------------------------------------- Multi-Disciplinary Care Plan Details Patient Name: Date of Service: Jonathan [name], Jonathan [name] 05/26/2020 2:00 PM Medical Record Number: [id number] Patient Account Number: [id number] Date of Birth/Sex: Treating RN: 05-09-1951 (69 y.o. Jonathan [name], Jonathan [name] Primary Care Elai Vanwyk: Marton Redwood Other Clinician: Referring Laketa Sandoz: Treating Journei Thomassen/Extender: Estelle Grumbles in Treatment: 16 Active Inactive Wound/Skin Impairment Nursing Diagnoses: Knowledge deficit related to ulceration/compromised skin integrity Goals: Patient/caregiver will verbalize understanding of skin care regimen Date Initiated: 01/30/2020 Target Resolution Date: 06/25/2020 Goal Status: Active Ulcer/skin breakdown will have a volume reduction of 50% by week 8 Date Initiated: 01/30/2020 Date Inactivated: 02/18/2020 Target Resolution Date: 03/31/2020 Goal Status: Unmet Unmet Reason: comorbities Ulcer/skin breakdown will have a volume reduction of 80% by week 12 Date Initiated: 02/18/2020 Date Inactivated: 04/07/2020 Target Resolution Date: 03/20/2020 Goal Status: Unmet Unmet Reason: comorbities Ulcer/skin breakdown will heal within 14 weeks Date Initiated: 04/07/2020 Date Inactivated: 04/21/2020 Target Resolution Date: 05/07/2020 Goal Status: Unmet Unmet Reason: comorbities Interventions: Assess patient/caregiver ability to obtain necessary supplies Assess patient/caregiver ability to perform ulcer/skin care regimen upon admission and as  needed Provide education on ulcer and skin care Treatment Activities: Skin care regimen initiated : 01/30/2020 Topical wound management initiated : 01/30/2020 Notes: Electronic Signature(s) Signed: 05/26/2020 5:04:07 PM By: Deon Pilling Entered By: Deon Pilling on 05/26/2020 14:56:29 -------------------------------------------------------------------------------- Pain Assessment Details Patient Name: Date of Service: Jonathan [name], Jonathan [name] 05/26/2020 2:00 PM Medical Record Number: [id number] Patient Account Number: [id number] Date of Birth/Sex: Treating RN: [birth year] (69 y.o. Hessie Diener Primary Care Taggart Prasad: Marton Redwood Other Clinician: Referring Jaidon Ellery: Treating Dreana Britz/Extender: Estelle Grumbles in Treatment: 16 Active Problems Location of Pain Severity and Description of Pain Patient Has Paino No Site Locations Rate the pain. Current Pain Level: 0 Character of Pain Describe the Pain: Tender Pain Management and Medication Current Pain Management: Electronic Signature(s) Signed: 05/26/2020 4:56:50 PM By: Baruch Gouty RN, BSN Signed: 05/26/2020 5:04:07 PM By: Deon Pilling Entered By: Baruch Gouty on 05/26/2020 14:37:17 -------------------------------------------------------------------------------- Patient/Caregiver Education Details Patient Name: Date of Service: Jonathan [name], Jonathan [name] 10/19/2021andnbsp2:00 PM Medical Record Number: [id number] Patient Account Number: [id number] Date of Birth/Gender: Treating RN: [birth year] (69 y.o. Hessie Diener Primary Care Physician: Marton Redwood Other Clinician: Referring Physician: Treating Physician/Extender: Estelle Grumbles in Treatment: 16 Education Assessment Education Provided To: Patient and Caregiver Education Topics Provided Wound/Skin Impairment: Handouts: Skin Care Do's and Dont's Methods: Explain/Verbal Responses: Reinforcements needed Electronic  Signature(s) Signed:  05/26/2020 5:04:07 PM By: Deon Pilling Entered By: Deon Pilling on 05/26/2020 14:56:45 -------------------------------------------------------------------------------- Wound Assessment Details Patient Name: Date of Service: Jonathan [name], Jonathan [name] 05/26/2020 2:00 PM Medical Record Number: [id number] Patient Account Number: [id number] Date of Birth/Sex: Treating RN: 1950/08/28 (69 y.o. Jonathan [name], Jonathan [name] Primary Care Jaece Ducharme: Marton Redwood Other Clinician: Referring Clay Solum: Treating Ruvi Fullenwider/Extender: Estelle Grumbles in Treatment: 16 Wound Status Wound Number: 1 Primary Diabetic Wound/Ulcer of the Lower Extremity Etiology: Wound Location: Right, Lateral Foot Wound Open Wounding Event: Gradually Appeared Status: Date Acquired: 12/26/2019 Comorbid Cataracts, Congestive Heart Failure, Hypertension, Myocardial Weeks Of Treatment: 16 History: Infarction, Hepatitis C, Type II Diabetes, End Stage Renal Disease, Clustered Wound: No Dementia, Neuropathy Wound Measurements Length: (cm) 1 Width: (cm) 1 Depth: (cm) 0.5 Area: (cm) 0.785 Volume: (cm) 0.393 Wound Description Classification: Grade 2 Wound Margin: Well defined, not attached Exudate Amount: Medium Exudate Type: Serous Exudate Color: amber Foul Odor After Cleansing: Slough/Fibrino % Reduction in Area: -400% % Reduction in Volume: -523.8% Epithelialization: Small (1-33%) Tunneling: No Undermining: Yes Starting Position (o'clock): 12 Ending Position (o'clock): 12 Maximum Distance: (cm) 0.4 No Yes Wound Bed Granulation Amount: None Present (0%) Exposed Structure Necrotic Amount: Large (67-100%) Fascia Exposed: No Necrotic Quality: Adherent Slough Fat Layer (Subcutaneous Tissue) Exposed: Yes Tendon Exposed: Yes Muscle Exposed: No Joint Exposed: No Bone Exposed: No Treatment Notes Wound #1 (Right, Lateral Foot) 1. Cleanse With Wound Cleanser 2. Periwound Care Skin Prep 3.  Primary Dressing Applied Iodoflex 4. Secondary Dressing Foam Border Dressing Electronic Signature(s) Signed: 05/26/2020 5:04:07 PM By: Deon Pilling Entered By: Deon Pilling on 05/26/2020 15:14:35 -------------------------------------------------------------------------------- Vitals Details Patient Name: Date of Service: Jonathan [name], Jonathan [name] 05/26/2020 2:00 PM Medical Record Number: [id number] Patient Account Number: [id number] Date of Birth/Sex: Treating RN: 1950-08-19 (69 y.o. Jonathan [name], Jonathan [name] Primary Care Takila Kronberg: Marton Redwood Other Clinician: Referring Dionna Wiedemann: Treating Tykeisha Peer/Extender: Estelle Grumbles in Treatment: 16 Vital Signs Time Taken: 14:25 Temperature (F): 97.9 Height (in): 71 Pulse (bpm): 85 Weight (lbs): 145 Respiratory Rate (breaths/min): 18 Body Mass Index (BMI): 20.2 Blood Pressure (mmHg): 151/78 Reference Range: 80 - 120 mg / dl Electronic Signature(s) Signed: 05/26/2020 3:24:41 PM By: Sandre Kitty Entered By: Sandre Kitty on 05/26/2020 14:24:37

## 2020-05-26 NOTE — Progress Notes (Signed)
Jonathan [name], Jonathan [name] ([id number]) Visit Report for 05/26/2020 Debridement Details Patient Name: Date of Service: Jonathan [name], Jonathan [name] 05/26/2020 2:00 PM Medical Record Number: [id number] Patient Account Number: [id number] Date of Birth/Sex: Treating RN: 08-21-1950 (69 y.o. [name], Meta.Reding Primary Care Provider: Marton Redwood Other Clinician: Referring Provider: Treating Provider/Extender: Estelle Grumbles in Treatment: 16 Debridement Performed for Assessment: Wound #1 Right,Lateral Foot Performed By: Physician Ricard Dillon., MD Debridement Type: Debridement Severity of Tissue Pre Debridement: Fat layer exposed Level of Consciousness (Pre-procedure): Awake and Alert Pre-procedure Verification/Time Out Yes - 14:50 Taken: Start Time: 14:51 Pain Control: Lidocaine 4% T opical Solution T Area Debrided (L x W): otal 1.5 (cm) x 1.5 (cm) = 2.25 (cm) Tissue and other material debrided: Viable, Non-Viable, Callus, Slough, Subcutaneous, Tendon, Skin: Dermis , Fibrin/Exudate, Slough Level: Skin/Subcutaneous Tissue/Muscle Debridement Description: Excisional Instrument: Curette Bleeding: Minimum Hemostasis Achieved: Pressure End Time: 14:57 Procedural Pain: 0 Response to Treatment: Procedure was tolerated well Level of Consciousness (Post- Awake and Alert procedure): Post Debridement Measurements of Total Wound Length: (cm) 1 Width: (cm) 1 Depth: (cm) 0.4 Volume: (cm) 0.314 Character of Wound/Ulcer Post Debridement: Improved Severity of Tissue Post Debridement: Fat layer exposed Post Procedure Diagnosis Same as Pre-procedure Electronic Signature(s) Signed: 05/26/2020 4:22:08 PM By: Linton Ham MD Signed: 05/26/2020 5:04:07 PM By: Deon Pilling Entered By: Deon Pilling on 05/26/2020 15:14:45 -------------------------------------------------------------------------------- HPI Details Patient Name: Date of Service: Jonathan [name], Jonathan [name] 05/26/2020 2:00 PM Medical Record  Number: [id number] Patient Account Number: [id number] Date of Birth/Sex: Treating RN: 25-Jan-1951 (69 y.o. Hessie Diener Primary Care Provider: Marton Redwood Other Clinician: Referring Provider: Treating Provider/Extender: Estelle Grumbles in Treatment: 16 History of Present Illness HPI Description: ADMISSION 01/30/2020 This is a 69 year old man who was hospitalized with Covid for a month in January 2021. Subsequently he was discharged to [facility] skilled facility for rehabilitation. His wife states that when he got home he had areas on the right lateral heel, right lateral fifth metatarsal head. They have been using Santyl to the right lateral heel no dressing on the right lateral foot. Our intake nurse discovered in small area on the right lateral great toe. He is minimally ambulatory at this point. He does not have a known history of PAD although he is a diabetic. 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 Still a small open area on the medial ffirth metatarsal head. But this is also better. He has not heard anything about his arterial studies that we ordered 7/13; patient's review with vein and vascular/noninvasive testing is not till 8/3. Still an open area on the right lateral first met head. The area on the right lateral heel remains closed although it is callused 7/27; arterial studies on 8 3. Still has the open area on the right fifth metatarsal head. We have been using Hydrofera  Blue 8/10-Comes a 2 weeks, had his vascular studies done, ABIs on both sides are reasonably good, TBI on the right is 145 Continuing with Hydrofera Blue, the wound is slightly larger than last time. 8/31;  months since I last saw this man. We have been using Hydrofera Blue to the small remaining area on the right fifth metatarsal head. He did have arterial studies done on 8/3 and while these were not terrible they certainly were normal. He is noncompressible on the right but with triphasic waveforms and a TBI of 0.96 on the left he is noncompressible with monophasic waveforms at the PTA triphasic at the dorsalis pedis with a TBI of 0.59 9/14 this patient still has a punched-out area on the right fifth metatarsal head lateral aspect. This is very painful. I looked back on his arterial studies his ABIs were noncompressible but with triphasic waveforms and a TBI of 0.96. We have been using Iodoflex 9/27; this patient has a punched-out wound on the right fifth metatarsal head lateral aspect. Very painful. He is a minimal ambulator and very difficult to pick up on claudication. I have once again reviewed his arterial studies which were really not all that impressive. He is noncompressible on the right but he had triphasic waveforms at the PTA and DP and a great toe pressure of 145 with a TBI of 0.96 we have been using Iodoflex without any improvement 10/5; x-ray from last week suggested osteomyelitis showing lateral demineralization in the fifth metatarsal head. He will need a noncontrasted MRI secondary to severe chronic renal failure last creatinine I see is 4.921 09/22/2019. We have been using Iodoflex 10/19; still using Iodoflex. Comes in today with a post debridement improvement in the wound bed. I removed skin and subcutaneous tissues some denuded tendon. The wound is small punched out with some undermining but generally a lot better surface than 2 weeks ago. I have him on Augmentin due to a plain  x-ray that suggested osteomyelitis. His noncontrasted MRI is on 4/26. The PCR culture that I did last time [not bone] group C strep as well as Peptostreptococcus. I have him on Augmentin 500/125 1 p.o. every 24 after dialysis on dialysis days Electronic Signature(s) Signed: 05/26/2020 4:22:08 PM By: Linton Ham MD Entered By: Linton Ham on 05/26/2020 15:19:21 -------------------------------------------------------------------------------- Physical Exam Details Patient Name: Date of Service: Jonathan [name], Jonathan [name] 05/26/2020 2:00 PM Medical Record Number: [id number] Patient Account Number: [id number] Date of Birth/Sex: Treating RN: 1951-01-27 (69 y.o. Hessie Diener Primary Care Provider: Marton Redwood Other Clinician: Referring Provider: Treating Provider/Extender: Estelle Grumbles in Treatment: 21 Constitutional Patient is hypertensive.. Pulse regular and within target range for patient.Marland Kitchen Respirations regular, non-labored and within target range.. Temperature is normal and within the target range for the patient.Marland Kitchen Appears in no distress. Notes Wound exam; right lateral fifth metatarsal head punched out wound. I was able to remove callus subcutaneous tissue from the wound margins subcutaneous debris from the surface of the wound including denuded tendon. The wound base cleans up quite nicely this still has depth but the tissue looks fairly healthy. There is no exposed bone. Electronic Signature(s) Signed: 05/26/2020 4:22:08 PM By: Linton Ham MD Entered By: Linton Ham on 05/26/2020 15:12:10 -------------------------------------------------------------------------------- Physician Orders Details Patient Name: Date of Service: Jonathan [name], Jonathan [name] 05/26/2020 2:00 PM Medical Record Number: [id number] Patient Account Number: [id number] Date of Birth/Sex: Treating RN: 25-May-1951 (69 y.o. Hessie Diener Primary Care Provider: Marton Redwood Other  Clinician: Referring Provider: Treating Provider/Extender: Estelle Grumbles in Treatment: 16 Verbal / Phone Orders: No Diagnosis Coding ICD-10 Coding Code Description E11.621 Type 2 diabetes mellitus with foot ulcer L97.512 Non-pressure chronic ulcer of other part of right foot  with fat layer exposed L97.411 Non-pressure chronic ulcer of right heel and midfoot limited to breakdown of skin E11.51 Type 2 diabetes mellitus with diabetic peripheral angiopathy without gangrene Follow-up Appointments Return Appointment in 2 weeks. Dressing Change Frequency Change Dressing every other day. Skin Barriers/Peri-Wound Care Skin Prep Wound Cleansing May shower with protection. - use a cast protector on days not changing. May shower and wash wound with soap and water. - with dressings changes. Primary Wound Dressing Wound #1 Right,Lateral Foot pplication - Wound center to run insurance approval for Epifix. Skin Substitute A Iodoflex Secondary Dressing Foam Border Off-Loading Open toe surgical shoe to: - right foot Other: - float right heel with pillows to offload pressure to heel while resting in chair and bed. no pressure to right foot. Additional Orders / Instructions Other: - ensure to go to schedule MRI on 06/02/2020. Patient Medications llergies: No Known Allergies A Notifications Medication Indication Start End osteomyelitis right foot 05/26/2020 Augmentin DOSE oral 500 mg-125 mg tablet - 1 tablet oral q24h take after dialysis on dialysis days [continuing Rx] Electronic Signature(s) Signed: 05/26/2020 4:22:08 PM By: Linton Ham MD Signed: 05/26/2020 5:04:07 PM By: Deon Pilling Previous Signature: 05/26/2020 3:16:25 PM Version By: Linton Ham MD Entered By: Deon Pilling on 05/26/2020 15:18:47 -------------------------------------------------------------------------------- Problem List Details Patient Name: Date of Service: Jonathan [name], Jonathan [name]  05/26/2020 2:00 PM Medical Record Number: [id number] Patient Account Number: [id number] Date of Birth/Sex: Treating RN: 10-12-1950 (69 y.o. Hessie Diener Primary Care Provider: Other Clinician: Marton Redwood Referring Provider: Treating Provider/Extender: Estelle Grumbles in Treatment: 16 Active Problems ICD-10 Encounter Code Description Active Date MDM Diagnosis E11.621 Type 2 diabetes mellitus with foot ulcer 01/30/2020 No Yes L97.512 Non-pressure chronic ulcer of other part of right foot with fat layer exposed 01/30/2020 No Yes E11.51 Type 2 diabetes mellitus with diabetic peripheral angiopathy without gangrene 01/30/2020 No Yes Inactive Problems ICD-10 Code Description Active Date Inactive Date L97.411 Non-pressure chronic ulcer of right heel and midfoot limited to breakdown of skin 01/30/2020 01/30/2020 Resolved Problems Electronic Signature(s) Signed: 05/26/2020 4:22:08 PM By: Linton Ham MD Entered By: Linton Ham on 05/26/2020 15:07:06 -------------------------------------------------------------------------------- Progress Note Details Patient Name: Date of Service: Jonathan [name], Jonathan [name] 05/26/2020 2:00 PM Medical Record Number: [id number] Patient Account Number: [id number] Date of Birth/Sex: Treating RN: 10-Sep-1950 (69 y.o. Hessie Diener Primary Care Provider: Marton Redwood Other Clinician: Referring Provider: Treating Provider/Extender: Estelle Grumbles in Treatment: 16 Subjective History of Present Illness (HPI) ADMISSION 01/30/2020 This is a 69 year old man who was hospitalized with Covid for a month in January 2021. Subsequently he was discharged to [facility] skilled facility for rehabilitation. His wife states that when he got home he had areas on the right lateral heel, right lateral fifth metatarsal head. They have been using Santyl to the right lateral heel no dressing on the right lateral foot. Our intake nurse  discovered in small area on the right lateral great toe. He is minimally ambulatory at this point. He does not have a known history of PAD although he is a diabetic. 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 Still a small open area on the medial ffirth metatarsal head. But this is also better. He has not heard anything about his arterial studies that we ordered 7/13; patient's review with vein and vascular/noninvasive testing is not till 8/3. Still an open area on the right lateral first met head. The area on the right lateral heel remains closed although it is callused 7/27; arterial studies on 8 3. Still has the open area on the right fifth metatarsal head. We have been using Hydrofera Blue 8/10-Comes a 2 weeks, had his vascular studies done, ABIs on both sides are reasonably good, TBI on the right is 145 Continuing with Hydrofera Blue, the wound is slightly larger than last time. 8/31; months since I last saw this man. We have been using Hydrofera Blue to the small remaining area on the right fifth metatarsal head. He did have arterial studies done on 8/3 and while these were not terrible they certainly were normal. He is noncompressible on the right but with triphasic waveforms and a TBI of 0.96 on the left he is noncompressible with monophasic waveforms at the PTA triphasic at the dorsalis pedis with a TBI of 0.59 9/14 this patient still has a punched-out area on the right fifth metatarsal head lateral aspect.  This is very painful. I looked back on his arterial studies his ABIs were noncompressible but with triphasic waveforms and a TBI of 0.96. We have been using Iodoflex 9/27; this patient has a punched-out wound on the right fifth metatarsal head lateral aspect. Very painful. He is a minimal ambulator and very difficult to pick up on claudication. I have once again reviewed his arterial studies which were really not all that impressive. He is noncompressible on the right but he had triphasic waveforms at the PTA and DP and a great toe pressure of 145 with a TBI of 0.96 we have been using Iodoflex without any improvement 10/5; x-ray from last week suggested osteomyelitis showing lateral demineralization in the fifth metatarsal head. He will need a noncontrasted MRI secondary to severe chronic renal failure last creatinine I see is 4.921 09/22/2019. We have been using Iodoflex 10/19; still using Iodoflex. Comes in today with a post debridement improvement in the wound bed. I removed skin and subcutaneous tissues some denuded tendon. The wound is small punched out with some undermining but generally a lot better surface than 2 weeks ago. I have him on Augmentin due to a plain x-ray that suggested osteomyelitis. His noncontrasted MRI is on 4/26. The PCR culture that I did last time [not bone] group C strep as well as Peptostreptococcus. I have him on Augmentin 500/125 1 p.o. every 24 after dialysis on dialysis days Objective Constitutional Patient is hypertensive.. Pulse regular and within target range for patient.Marland Kitchen Respirations regular, non-labored and within target range.. Temperature is normal and within the target range for the patient.Marland Kitchen Appears in no distress. Vitals Time Taken: 2:25 PM, Height: 71 in, Weight: 145 lbs, BMI: 20.2, Temperature: 97.9 F, Pulse: 85 bpm, Respiratory Rate: 18 breaths/min, Blood Pressure: 151/78 mmHg. General Notes: Wound exam; right lateral fifth metatarsal head punched out  wound. I was able to remove callus subcutaneous tissue from the wound margins subcutaneous debris from the surface of the wound including denuded tendon. The wound base cleans up quite nicely this still has depth but the tissue looks fairly healthy. There is no exposed bone. Integumentary (Hair, Skin) Wound #1 status is  Open. Original cause of wound was Gradually Appeared. The wound is located on the Right,Lateral Foot. The wound measures 1cm length x 1cm width x 0.5cm depth; 0.785cm^2 area and 0.393cm^3 volume. There is tendon and Fat Layer (Subcutaneous Tissue) exposed. There is no tunneling noted, however, there is undermining starting at 12:00 and ending at 12:00 with a maximum distance of 0.4cm. There is a medium amount of serous drainage noted. The wound margin is well defined and not attached to the wound base. There is no granulation within the wound bed. There is a large (67-100%) amount of necrotic tissue within the wound bed including Adherent Slough. Assessment Active Problems ICD-10 Type 2 diabetes mellitus with foot ulcer Non-pressure chronic ulcer of other part of right foot with fat layer exposed Type 2 diabetes mellitus with diabetic peripheral angiopathy without gangrene Procedures Wound #1 Pre-procedure diagnosis of Wound #1 is a Diabetic Wound/Ulcer of the Lower Extremity located on the Right,Lateral Foot .Severity of Tissue Pre Debridement is: Fat layer exposed. There was a Excisional Skin/Subcutaneous Tissue/Muscle Debridement with a total area of 2.25 sq cm performed by Ricard Dillon., MD. With the following instrument(s): Curette to remove Viable and Non-Viable tissue/material. Material removed includes T endon, Callus, Subcutaneous Tissue, Slough, Skin: Dermis, and Fibrin/Exudate after achieving pain control using Lidocaine 4% Topical Solution. A time out was conducted at 14:50, prior to the start of the procedure. A Minimum amount of bleeding was controlled with  Pressure. The procedure was tolerated well with a pain level of 0 throughout. Post Debridement Measurements: 1cm length x 1cm width x 0.4cm depth; 0.314cm^3 volume. Character of Wound/Ulcer Post Debridement is improved. Severity of Tissue Post Debridement is: Fat layer exposed. Post procedure Diagnosis Wound #1: Same as Pre-Procedure Plan Follow-up Appointments: Return Appointment in 2 weeks. Dressing Change Frequency: Change Dressing every other day. Skin Barriers/Peri-Wound Care: Skin Prep Wound Cleansing: May shower with protection. - use a cast protector on days not changing. May shower and wash wound with soap and water. - with dressings changes. Primary Wound Dressing: Wound #1 Right,Lateral Foot: Skin Substitute Application - Wound center to run insurance approval for Epifix. Iodoflex Secondary Dressing: Foam Border Off-Loading: Open toe surgical shoe to: - right foot Other: - float right heel with pillows to offload pressure to heel while resting in chair and bed. no pressure to right foot. Additional Orders / Instructions: Other: - ensure to go to schedule MRI on 06/02/2020. The following medication(s) was prescribed: Augmentin oral 500 mg-125 mg tablet 1 tablet oral q24h take after dialysis on dialysis days [continuing Rx] for osteomyelitis right foot starting 05/26/2020 1. Extensive debridement today carefully done. Base of the wound cleans up quite nicely a lot better than 2 weeks ago 2. I am continuing the Augmentin for another 2 weeks at least until we have the MRI 3. In spite of the appearance of this wound and difficulty feeling pulses in his feet his arterial studies on the right were really quite benign Electronic Signature(s) Signed: 05/26/2020 3:19:47 PM By: Linton Ham MD Previous Signature: 05/26/2020 3:17:33 PM Version By: Linton Ham MD Entered By: Linton Ham on 05/26/2020  15:19:47 -------------------------------------------------------------------------------- SuperBill Details Patient Name: Date of Service: Jonathan [name], Jonathan [name] 05/26/2020 Medical Record Number: [id number] Patient Account Number: [id number] Date of Birth/Sex: Treating RN: 09/25/1950 (69 y.o. Hessie Diener Primary Care Provider: Marton Redwood Other Clinician: Referring Provider: Treating Provider/Extender: Estelle Grumbles in Treatment: 16 Diagnosis Coding ICD-10 Codes Code Description [phone] Type 2 diabetes  mellitus with foot ulcer L97.512 Non-pressure chronic ulcer of other part of right foot with fat layer exposed L97.411 Non-pressure chronic ulcer of right heel and midfoot limited to breakdown of skin E11.51 Type 2 diabetes mellitus with diabetic peripheral angiopathy without gangrene Facility Procedures CPT4 Code: [id number] Description: 26378 - DEB SUBQ TISSUE 20 SQ CM/< ICD-10 Diagnosis Description L97.512 Non-pressure chronic ulcer of other part of right foot with fat layer exposed Modifier: Quantity: 1 Physician Procedures : CPT4 Code Description Modifier [id number] - WC PHYS SUBQ TISS 20 SQ CM ICD-10 Diagnosis Description L97.512 Non-pressure chronic ulcer of other part of right foot with fat layer exposed Quantity: 1 Electronic Signature(s) Signed: 05/26/2020 4:22:08 PM By: Linton Ham MD Entered By: Linton Ham on 05/26/2020 15:20:00

## 2020-06-01 ENCOUNTER — Emergency Department (HOSPITAL_COMMUNITY)
Admission: EM | Admit: 2020-06-01 | Discharge: 2020-06-01 | Disposition: A | Discharge status: Home / Self Care | Payer: Medicare Other | Attending: Emergency Medicine | Admitting: Emergency Medicine

## 2020-06-01 ENCOUNTER — Other Ambulatory Visit: Payer: Self-pay

## 2020-06-01 DIAGNOSIS — E1122 Type 2 diabetes mellitus with diabetic chronic kidney disease: Secondary | ICD-10-CM | POA: Insufficient documentation

## 2020-06-01 DIAGNOSIS — T8612 Kidney transplant failure: Secondary | ICD-10-CM | POA: Insufficient documentation

## 2020-06-01 DIAGNOSIS — Z794 Long term (current) use of insulin: Secondary | ICD-10-CM | POA: Diagnosis not present

## 2020-06-01 DIAGNOSIS — Z87891 Personal history of nicotine dependence: Secondary | ICD-10-CM | POA: Insufficient documentation

## 2020-06-01 DIAGNOSIS — N186 End stage renal disease: Secondary | ICD-10-CM | POA: Diagnosis not present

## 2020-06-01 DIAGNOSIS — I5022 Chronic systolic (congestive) heart failure: Secondary | ICD-10-CM | POA: Diagnosis not present

## 2020-06-01 DIAGNOSIS — Z7901 Long term (current) use of anticoagulants: Secondary | ICD-10-CM | POA: Insufficient documentation

## 2020-06-01 DIAGNOSIS — Z992 Dependence on renal dialysis: Secondary | ICD-10-CM | POA: Diagnosis not present

## 2020-06-01 DIAGNOSIS — Z8616 Personal history of COVID-19: Secondary | ICD-10-CM | POA: Diagnosis not present

## 2020-06-01 DIAGNOSIS — R58 Hemorrhage, not elsewhere classified: Secondary | ICD-10-CM

## 2020-06-01 DIAGNOSIS — Z79899 Other long term (current) drug therapy: Secondary | ICD-10-CM | POA: Insufficient documentation

## 2020-06-01 DIAGNOSIS — T82838A Hemorrhage of vascular prosthetic devices, implants and grafts, initial encounter: Secondary | ICD-10-CM | POA: Diagnosis present

## 2020-06-01 DIAGNOSIS — Z940 Kidney transplant status: Secondary | ICD-10-CM | POA: Insufficient documentation

## 2020-06-01 DIAGNOSIS — Z7982 Long term (current) use of aspirin: Secondary | ICD-10-CM | POA: Insufficient documentation

## 2020-06-01 DIAGNOSIS — I132 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 kidney disease, or end stage renal disease: Secondary | ICD-10-CM | POA: Insufficient documentation

## 2020-06-01 NOTE — ED Provider Notes (Signed)
[facility] EMERGENCY DEPARTMENT Provider Note   CSN: [id number] Arrival date & time: 06/01/20  1837     History Chief Complaint  Patient presents with  . Fistula Bleeding    Trayton Szabo Sr. is a 69 y.o. male.  Multimeric problems document below to include end-stage renal disease on dialysis who presents to the emergency room today for bleeding from his dialysis site.  Patient states he began to assess for 20 years not read apartment this before.  He was having uncontrolled bleeding after a normal session so they put on clamps and a dressing and sent him here for further evaluation.  Patient without any other bleeding episodes.  No loss of consciousness, lightheadedness, chest pain, shortness of breath, weakness or other associated symptoms.        Past Medical History:  Diagnosis Date  . Diabetes mellitus without complication ([place])   . FUO (fever of unknown origin) 09/17/2019  . Hypertension   . Renal disorder 2015   right kidney transplant  . Stroke [facility])     Patient Active Problem List   Diagnosis Date Noted  . FUO (fever of unknown origin) 09/17/2019  . Cerebral thrombosis with cerebral infarction 08/02/2019  . Cerebral embolism with cerebral infarction 08/02/2019  . Acute respiratory failure due to COVID-19 ([place]) 07/30/2019  . Acute respiratory failure with hypoxia ([place]) 07/30/2019  . Chronic systolic HF (heart failure) ([place]) 02/25/2019  . ESRD (end stage renal disease) on dialysis ([place]) 02/25/2019  . Generalized abdominal pain 07/16/2018  . Arteriovenous fistula for hemodialysis in place, primary ([place]) 05/15/2018  . 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 Acute blood loss anemia 12/14/2017  . Hypomagnesemia 12/03/2017  . Anticoagulated 12/02/2017  . Stroke ([place]) 11/29/2017  . 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 History of MI (myocardial infarction) 11/28/2017  . Hypertension 11/28/2017  . Chronic hepatitis C  without hepatic coma ([place]) 09/25/2017  . Diabetes mellitus type 2, uncomplicated ([place]) 99/83/3825  . Myocardial infarction ([place]) 09/25/2017  . Renal transplant recipient 09/25/2017  . Transfusion history 09/25/2017  . History of coronary artery disease 09/12/2016  . 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 MI ([place]) 09/12/2016  . Aftercare following organ transplant 09/11/2016  . 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 At risk for infection transmitted from donor 06/16/2014  . Ureteral stent displacement ([place]) 06/10/2014  . Prophylactic antibiotic 06/09/2014  . Immunosuppression ([place]) 06/06/2014    Past Surgical History:  Procedure Laterality Date  . BACK SURGERY     Has had 2 back surgeries  . BUBBLE STUDY  09/18/2019   Procedure: BUBBLE STUDY;  Surgeon: Buford Dresser, MD;  Location: [facility] ENDOSCOPY;  Service: Cardiovascular;;  . Depression    . HAND SURGERY Right   . HAND SURGERY    . IR REMOVAL TUN CV CATH W/O FL  09/03/2019  . KIDNEY TRANSPLANT     November 2015  . Memory loss    . NEPHRECTOMY TRANSPLANTED ORGAN    . TEE WITHOUT CARDIOVERSION N/A 09/18/2019   Procedure: TRANSESOPHAGEAL ECHOCARDIOGRAM (TEE);  Surgeon: Buford Dresser, MD;  Location: [facility] ENDOSCOPY;  Service: Cardiovascular;  Laterality: N/A;       Family History  Problem Relation Age of Onset  . Diabetes Mellitus II Mother   . Diabetes Mellitus II Brother     Social History   Tobacco Use  . Smoking status: Former Research scientist (life sciences)  . Smokeless tobacco: Never Used  Substance Use Topics  .  Alcohol use: Never  . Drug use: Never    Home Medications Prior to Admission medications   Medication Sig Start Date End Date Taking? Authorizing Provider  acetaminophen (TYLENOL) 325 MG tablet Take 2 tablets (650 mg total) by mouth every 6 (six) hours as needed for mild pain (or Fever >/= 101). Patient not taking: Reported on 02/10/2020 09/21/19   Raiford Noble Latif, DO    allopurinol (ZYLOPRIM) 100 MG tablet Take 100 mg by mouth daily.  06/09/14   [provider]  Amino Acids-Protein Hydrolys (FEEDING SUPPLEMENT, PRO-STAT SUGAR FREE 64,) LIQD Take 30 mLs by mouth 2 (two) times daily. Patient not taking: Reported on 02/10/2020 09/21/19   Raiford Noble Latif, DO  aspirin 81 MG chewable tablet Chew 81 mg by mouth daily.  06/09/14   [provider]  atorvastatin (LIPITOR) 40 MG tablet Take 40 mg by mouth daily.    [provider]  B Complex-C-Folic Acid (RENA-VITE RX) 1 MG TABS Take 1 tablet by mouth daily. Patient not taking: Reported on 02/10/2020 05/21/19   [provider]  calcium acetate (PHOSLO) 667 MG capsule Take 1,334 mg by mouth 3 (three) times daily with meals.  Patient not taking: Reported on 02/10/2020    [provider]  glucagon, human recombinant, (GLUCAGEN) 1 MG injection Inject 1 mg into the muscle once as needed for low blood sugar. Patient not taking: Reported on 02/10/2020 09/21/19 09/15/20  Raiford Noble Latif, DO  insulin degludec (TRESIBA FLEXTOUCH) 100 UNIT/ML SOPN FlexTouch Pen Inject 0.06 mLs (6 Units total) into the skin daily. Patient taking differently: Inject 6 Units into the skin as needed.  09/21/19   Sheikh, Georgina Quint Latif, DO  Insulin Pen Needle (BD PEN NEEDLE NANO U/F) 32G X 4 MM MISC 1 each by Other route as needed (insulin).  09/10/16   [provider]  levalbuterol Penne Lash HFA) 45 MCG/ACT inhaler Inhale 2 puffs into the lungs every 8 (eight) hours as needed for wheezing. 09/21/19   Raiford Noble Latif, DO  lidocaine-prilocaine (EMLA) cream Apply 1 application topically 3 (three) times a week. 30 minutes prior to Dialysis - MWF 05/08/18   [provider]  losartan (COZAAR) 25 MG tablet Take 25 mg by mouth daily.  Patient not taking: Reported on 02/10/2020 03/29/19   [provider]  metoCLOPramide (REGLAN) 5 MG tablet Take 5 mg by mouth 3 (three) times daily with meals.    [provider]  metoprolol succinate (TOPROL-XL) 50 MG 24 hr tablet Take 50 mg by mouth daily.  Patient not taking: Reported on 02/10/2020 06/09/14   [provider]  NOVOLOG FLEXPEN 100 UNIT/ML FlexPen Inject 0-10 Units into the skin as needed for high blood sugar. Per sliding scale 05/29/17   [provider]  Nutritional Supplements (FEEDING SUPPLEMENT, NEPRO CARB STEADY,) LIQD Take 237 mLs by mouth 3 (three) times daily between meals. Patient not taking: Reported on 02/10/2020 09/21/19   Raiford Noble Latif, DO  ondansetron (ZOFRAN) 4 MG tablet Take 1 tablet (4 mg total) by mouth every 6 (six) hours as needed for nausea. 09/21/19   Raiford Noble Latif, DO  pantoprazole (PROTONIX) 40 MG tablet Take 1 tablet (40 mg total) by mouth daily. Patient not taking: Reported on 02/10/2020 09/22/19   Raiford Noble Latif, DO  predniSONE (DELTASONE) 5 MG tablet Take 5 mg by mouth daily with breakfast.  03/17/17   [provider]  senna (SENOKOT) 8.6 MG tablet Take 1 tablet by mouth as  needed for constipation.    [provider]  warfarin (COUMADIN) 4 MG tablet Take 1 tablet (4 mg total) by mouth daily at 6 PM. Patient taking differently: Take 4 mg by mouth See admin instructions. 6mg  on MWF, Tues, Thurs, Sat and Sun - 9mg . 09/21/19   Kerney Elbe, DO    Allergies    Patient has no known allergies.  Review of Systems   Review of Systems  All other systems reviewed and are negative.   Physical Exam Updated Vital Signs BP 100/66 (BP Location: Right Arm)   Pulse 94   Temp (!) 97.5 F (36.4 C)   Resp 16   SpO2 99%   Physical Exam Vitals and nursing note reviewed.  Constitutional:      Appearance: He is well-developed.  HENT:     Head: Normocephalic and atraumatic.     Nose: Nose normal. No congestion or rhinorrhea.     Mouth/Throat:     Mouth: Mucous membranes are moist.  Eyes:     Pupils: Pupils are equal, round, and reactive to light.  Cardiovascular:      Rate and Rhythm: Normal rate.  Pulmonary:     Effort: Pulmonary effort is normal. No respiratory distress.  Abdominal:     General: Abdomen is flat. There is no distension.  Musculoskeletal:        General: Normal range of motion.     Cervical back: Normal range of motion.  Skin:    General: Skin is warm and dry.  Neurological:     General: No focal deficit present.     Mental Status: He is alert.     Cranial Nerves: No cranial nerve deficit.     Sensory: No sensory deficit.     ED Results / Procedures / Treatments   Labs (all labs ordered are listed, but only abnormal results are displayed) Labs Reviewed - No data to display  EKG None  Radiology MR FOOT RIGHT WO CONTRAST  Result Date: 06/02/2020 CLINICAL DATA:  Nonhealing wound at the distal right fifth metatarsal. EXAM: MRI OF THE RIGHT FOREFOOT WITHOUT CONTRAST TECHNIQUE: Multiplanar, multisequence MR imaging of the right forefoot was performed. No intravenous contrast was administered. COMPARISON:  Plain films right foot 05/05/2020. FINDINGS: Bones/Joint/Cartilage There is edema in the head and neck of the fifth metatarsal and proximal 1.5 cm of the proximal phalanx of the little toe consistent with osteomyelitis. No other evidence of osteomyelitis is identified. No fracture or dislocation. No joint effusion. Mild first MTP osteoarthritis noted. Ligaments Intact. Muscles and Tendons No intramuscular fluid collection.  No tendon tear or strain. Soft tissues Skin wound is seen adjacent to the fifth MTP joint. No underlying abscess. IMPRESSION: Findings consistent with osteomyelitis in the proximal 1.5 cm of the proximal phalanx of the little toe and head and neck of the fifth metatarsal. Negative for abscess or septic joint. Electronically Signed   By: Inge Rise M.D.   On: 06/02/2020 16:19    Procedures Procedures (including critical care time)  Medications Ordered in ED Medications - No data to display  ED Course  I  have reviewed the triage vital signs and the nursing notes.  Pertinent labs & imaging results that were available during my care of the patient were reviewed by me and considered in my medical decision making (see chart for details).    MDM Rules/Calculators/A&P  Removed clamps. Will observe for re-bleeding. Hemoglobin check if indicated. Vascular consult if indicated.  Observed for >1.5 hours without rebleeding. Ambulated without difficulty and not bleeding. Will dc w/ dressing in place. Thrill and pulse present distally. Stable. Recheck with dialysis in a couple days.   Final Clinical Impression(s) / ED Diagnoses Final diagnoses:  Bleeding    Rx / DC Orders ED Discharge Orders    None       Holden Draughon, Corene Cornea, MD 06/03/20 0028

## 2020-06-01 NOTE — ED Notes (Signed)
Patient verbalizes understanding of discharge instructions. Opportunity for questioning and answers were provided. Armband removed by staff, pt discharged from ED in wheelchair to home.   

## 2020-06-01 NOTE — ED Notes (Signed)
This RN offered PO intake to this pt; pt refusing at this time.

## 2020-06-01 NOTE — ED Notes (Signed)
Dialysis clamps removed by EDP

## 2020-06-01 NOTE — ED Triage Notes (Signed)
Pt BIB [facility] from [facility] d/t uncontrolled fistula bleeding after receiving complete treatment.   Pt arrives to ED with right arm clamps and bleeding controlled.

## 2020-06-01 NOTE — ED Notes (Signed)
Pt ambulated well at baseline with assistance. No signs of further bleeding noted from the fistula. Pt returned back to the stretcher.

## 2020-06-02 ENCOUNTER — Other Ambulatory Visit (HOSPITAL_COMMUNITY): Payer: Self-pay | Admitting: Internal Medicine

## 2020-06-02 ENCOUNTER — Ambulatory Visit (HOSPITAL_COMMUNITY)
Admission: RE | Admit: 2020-06-02 | Discharge: 2020-06-02 | Disposition: A | Discharge status: Home / Self Care | Payer: Medicare Other | Source: Ambulatory Visit | Attending: Internal Medicine | Admitting: Internal Medicine

## 2020-06-02 DIAGNOSIS — E11621 Type 2 diabetes mellitus with foot ulcer: Secondary | ICD-10-CM

## 2020-06-02 DIAGNOSIS — L97529 Non-pressure chronic ulcer of other part of left foot with unspecified severity: Secondary | ICD-10-CM

## 2020-06-09 ENCOUNTER — Encounter: Payer: Self-pay | Admitting: Podiatry

## 2020-06-09 ENCOUNTER — Ambulatory Visit (INDEPENDENT_AMBULATORY_CARE_PROVIDER_SITE_OTHER): Payer: Medicare Other | Admitting: Podiatry

## 2020-06-09 ENCOUNTER — Other Ambulatory Visit: Payer: Self-pay

## 2020-06-09 DIAGNOSIS — E1149 Type 2 diabetes mellitus with other diabetic neurological complication: Secondary | ICD-10-CM

## 2020-06-09 DIAGNOSIS — B351 Tinea unguium: Secondary | ICD-10-CM

## 2020-06-09 DIAGNOSIS — M79675 Pain in left toe(s): Secondary | ICD-10-CM

## 2020-06-09 DIAGNOSIS — L97501 Non-pressure chronic ulcer of other part of unspecified foot limited to breakdown of skin: Secondary | ICD-10-CM

## 2020-06-09 DIAGNOSIS — M79674 Pain in right toe(s): Secondary | ICD-10-CM

## 2020-06-09 DIAGNOSIS — E1140 Type 2 diabetes mellitus with diabetic neuropathy, unspecified: Secondary | ICD-10-CM | POA: Diagnosis not present

## 2020-06-09 DIAGNOSIS — D689 Coagulation defect, unspecified: Secondary | ICD-10-CM | POA: Diagnosis not present

## 2020-06-09 NOTE — Progress Notes (Signed)
This patient returns to my office for at risk foot care.  This patient requires this care by a professional since this patient will be at risk due to having type 2 diabetes, and ESRD and coagulation defect.  Patient is taking coumadin.  This patient presents to the office with his wife and has been treated at wound care center due to open wound right fight foot.  This patient is unable to cut nails himself since the patient cannot reach his nails.These nails are painful walking and wearing shoes.  This patient presents for at risk foot care today.  General Appearance  Alert, conversant and in no acute stress.  Vascular  Dorsalis pedis and posterior tibial  pulses are not  palpable  bilaterally.  Capillary return is within normal limits  bilaterally. Temperature is within normal limits  bilaterally.  Neurologic  Senn-Weinstein monofilament wire test diminished/absent   bilaterally. Muscle power within normal limits bilaterally.  Nails Thick disfigured discolored nails with subungual debris  from hallux to fifth toes bilaterally. No evidence of bacterial infection or drainage bilaterally.  Orthopedic  No limitations of motion  feet .  No crepitus or effusions noted.  No bony pathology or digital deformities noted.  Skin  normotropic skin with no porokeratosis noted bilaterally.  No signs of infections or ulcers noted.   Dorsolateral aspect 5th MPJ ulcer.  Onychomycosis  Pain in right toes  Pain in left toes  Consent was obtained for treatment procedures.   Mechanical debridement of nails 1-5  bilaterally performed with a nail nipper.  Filed with dremel without incident.  Ulcer was rebandaged with silvadene/DSD.   Return office visit    3 months                  Told patient to return for periodic foot care and evaluation due to potential at risk complications.   Gardiner Barefoot DPM

## 2020-06-11 ENCOUNTER — Other Ambulatory Visit: Payer: Self-pay

## 2020-06-11 ENCOUNTER — Encounter (HOSPITAL_BASED_OUTPATIENT_CLINIC_OR_DEPARTMENT_OTHER): Payer: Medicare Other | Attending: Internal Medicine | Admitting: Internal Medicine

## 2020-06-11 DIAGNOSIS — L97512 Non-pressure chronic ulcer of other part of right foot with fat layer exposed: Secondary | ICD-10-CM | POA: Diagnosis present

## 2020-06-11 DIAGNOSIS — Z940 Kidney transplant status: Secondary | ICD-10-CM | POA: Diagnosis not present

## 2020-06-11 DIAGNOSIS — E11621 Type 2 diabetes mellitus with foot ulcer: Secondary | ICD-10-CM | POA: Insufficient documentation

## 2020-06-11 DIAGNOSIS — L97514 Non-pressure chronic ulcer of other part of right foot with necrosis of bone: Secondary | ICD-10-CM | POA: Diagnosis not present

## 2020-06-11 DIAGNOSIS — E1151 Type 2 diabetes mellitus with diabetic peripheral angiopathy without gangrene: Secondary | ICD-10-CM | POA: Diagnosis not present

## 2020-06-11 NOTE — Progress Notes (Signed)
RAAHIM, SHARTZER ([id number]) Visit Report for 06/11/2020 Debridement Details Patient Name: Date of Service: Jonathan [name], Jonathan [name] 06/11/2020 12:30 PM Medical Record Number: [id number] Patient Account Number: [id number] Date of Birth/Sex: Treating RN: 1950-09-16 (69 y.o. Male) Deon Pilling Primary Care Provider: Marton Redwood Other Clinician: Referring Provider: Treating Provider/Extender: Estelle Grumbles in Treatment: 19 Debridement Performed for Assessment: Wound #1 Right,Lateral Foot Performed By: Physician Ricard Dillon., MD Debridement Type: Debridement Severity of Tissue Pre Debridement: Fat layer exposed Level of Consciousness (Pre-procedure): Awake and Alert Pre-procedure Verification/Time Out Yes - 13:35 Taken: Start Time: 13:36 Pain Control: Lidocaine 4% T opical Solution T Area Debrided (L x W): otal 1.4 (cm) x 1.4 (cm) = 1.96 (cm) Tissue and other material debrided: Viable, Non-Viable, Slough, Subcutaneous, Skin: Dermis , Skin: Epidermis, Fibrin/Exudate, Slough Level: Skin/Subcutaneous Tissue Debridement Description: Excisional Instrument: Curette Bleeding: Moderate Hemostasis Achieved: Pressure End Time: 13:40 Procedural Pain: 0 Post Procedural Pain: 0 Response to Treatment: Procedure was tolerated well Level of Consciousness (Post- Awake and Alert procedure): Post Debridement Measurements of Total Wound Length: (cm) 1.4 Width: (cm) 1.4 Depth: (cm) 0.6 Volume: (cm) 0.924 Character of Wound/Ulcer Post Debridement: Requires Further Debridement Severity of Tissue Post Debridement: Fat layer exposed Post Procedure Diagnosis Same as Pre-procedure Electronic Signature(s) Signed: 06/11/2020 5:37:49 PM By: Linton Ham MD Signed: 06/11/2020 6:08:00 PM By: Deon Pilling Entered By: Deon Pilling on 06/11/2020 13:41:11 -------------------------------------------------------------------------------- HPI Details Patient Name: Date of Service: Jonathan [name], Jonathan [name] 06/11/2020 12:30 PM Medical Record Number: [id number] Patient Account Number: [id number] Date of Birth/Sex: Treating RN: 1951/01/25 (69 y.o. Male) Deon Pilling Primary Care Provider: Marton Redwood Other Clinician: Referring Provider: Treating Provider/Extender: Estelle Grumbles in Treatment: 2 History of Present Illness HPI Description: ADMISSION 01/30/2020 This is a 69 year old man who was hospitalized with Covid for a month in January 2021. Subsequently he was discharged to [facility] skilled facility for rehabilitation. His wife states that when he got home he had areas on the right lateral heel, right lateral fifth metatarsal head. They have been using Santyl to the right lateral heel no dressing on the right lateral foot. Our intake nurse discovered in small area on the right lateral great toe. He is minimally ambulatory at this point. He does not have a known history of PAD although he is a diabetic. 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 Still a small open area on the medial ffirth metatarsal head. But this is also better. He has not heard anything about his arterial studies that we ordered 7/13; patient's review with vein and vascular/noninvasive testing is not till 8/3. Still an open area on the right lateral first met head. The area on the right lateral heel remains closed although it is callused 7/27; arterial studies on 8 3. Still has the open area on the right fifth  metatarsal head. We have been using Hydrofera Blue 8/10-Comes a 2 weeks, had his vascular studies done, ABIs on both sides are reasonably good, TBI on the right is 145 Continuing with Hydrofera Blue, the wound is  slightly larger than last time. 8/31; months since I last saw this man. We have been using Hydrofera Blue to the small remaining area on the right fifth metatarsal head. He did have arterial studies done on 8/3 and while these were not terrible they certainly were normal. He is noncompressible on the right but with triphasic waveforms and a TBI of 0.96 on the left he is noncompressible with monophasic waveforms at the PTA triphasic at the dorsalis pedis with a TBI of 0.59 9/14 this patient still has a punched-out area on the right fifth metatarsal head lateral aspect. This is very painful. I looked back on his arterial studies his ABIs were noncompressible but with triphasic waveforms and a TBI of 0.96. We have been using Iodoflex 9/27; this patient has a punched-out wound on the right fifth metatarsal head lateral aspect. Very painful. He is a minimal ambulator and very difficult to pick up on claudication. I have once again reviewed his arterial studies which were really not all that impressive. He is noncompressible on the right but he had triphasic waveforms at the PTA and DP and a great toe pressure of 145 with a TBI of 0.96 we have been using Iodoflex without any improvement 10/5; x-ray from last week suggested osteomyelitis showing lateral demineralization in the fifth metatarsal head. He will need a noncontrasted MRI secondary to severe chronic renal failure last creatinine I see is 4.921 09/22/2019. We have been using Iodoflex 10/19; still using Iodoflex. Comes in today with a post debridement improvement in the wound bed. I removed skin and subcutaneous tissues some denuded tendon. The wound is small punched out with some undermining but generally a lot better surface than 2 weeks  ago. I have him on Augmentin due to a plain x-ray that suggested osteomyelitis. His noncontrasted MRI is on 4/26. The PCR culture that I did last time [not bone] group C strep as well as Peptostreptococcus. I have him on Augmentin 500/125 1 p.o. every 24 after dialysis on dialysis days 11/4; the patient is still using Iodoflex. His MRI shows osteomyelitis of the proximal phalanx of the right fifth toe as well as the head and neck of the fifth metatarsal. I have him on Augmentin not making much progress. I suspect he is going to require an ID consultation. He is on dialysis Electronic Signature(s) Signed: 06/11/2020 5:37:49 PM By: Linton Ham MD Entered By: Linton Ham on 06/11/2020 14:10:21 -------------------------------------------------------------------------------- Physical Exam Details Patient Name: Date of Service: Jonathan [name], Jonathan [name] 06/11/2020 12:30 PM Medical Record Number: [id number] Patient Account Number: [id number] Date of Birth/Sex: Treating RN: [birth year] (69 y.o. Male) Deon Pilling Primary Care Provider: Marton Redwood Other Clinician: Referring Provider: Treating Provider/Extender: Estelle Grumbles in Treatment: 19 Cardiovascular Pedal pulses not palpable. Notes Wound exam; right lateral fifth metatarsal head.'s this is still a punched-out wound the surface of this does not look too bad however he is slightly tender around this. There is tendon at the base of this no exposed bone. Electronic Signature(s) Signed: 06/11/2020 5:37:49 PM By: Linton Ham MD Entered By: Linton Ham on 06/11/2020 14:11:17 -------------------------------------------------------------------------------- Physician Orders Details Patient Name: Date of Service: Jonathan [name], Jonathan [name] 06/11/2020 12:30 PM Medical Record Number: [id number] Patient Account Number: [id number] Date of Birth/Sex: Treating RN: 1950-10-14 (69 y.o. Male) Deon Pilling Primary Care Provider: Marton Redwood Other Clinician: Referring Provider: Treating Provider/Extender: Estelle Grumbles in Treatment: 79 Verbal / Phone Orders: No Diagnosis Coding ICD-10 Coding Code Description E11.621 Type  2 diabetes mellitus with foot ulcer L97.512 Non-pressure chronic ulcer of other part of right foot with fat layer exposed E11.51 Type 2 diabetes mellitus with diabetic peripheral angiopathy without gangrene Follow-up Appointments ppointment in: - Tuesday 06/16/2020. Return A Dressing Change Frequency Change Dressing every other day. Skin Barriers/Peri-Wound Care Skin Prep Wound Cleansing May shower with protection. - use a cast protector on days not changing. May shower and wash wound with soap and water. - with dressings changes. Primary Wound Dressing Wound #1 Right,Lateral Foot Iodoflex Secondary Dressing Foam Border Off-Loading Open toe surgical shoe to: - right foot Other: - float right heel with pillows to offload pressure to heel while resting in chair and bed. no pressure to right foot. Additional Orders / Instructions Other: - Stop Augmentin right now for a bone culture to be performed on Tuesday. Consults Infectious Disease - Refer to [facility] related to osteomyelitis to right foot. - (ICD10 E11.621 - Type 2 diabetes mellitus with foot ulcer) Electronic Signature(s) Signed: 06/11/2020 5:37:49 PM By: Linton Ham MD Signed: 06/11/2020 6:08:00 PM By: Deon Pilling Entered By: Deon Pilling on 06/11/2020 14:15:40 Prescription 06/11/2020 -------------------------------------------------------------------------------- Antonietta Barcelona MD Patient Name: Provider: [birth year] [id number] Date of Birth: NPI#: Male [id number] Sex: DEA#: [phone] [id number] Phone #: License #: [place] Patient Address: [address], [place] [address] [place], [place] [address] [phone] Allergies No Known Allergies Provider's Orders Infectious Disease - ICD10: E11.621 - Refer to [facility] related to osteomyelitis to right foot. Hand Signature: Date(s): Electronic Signature(s) Signed: 06/11/2020 5:37:49 PM By: Linton Ham MD Signed: 06/11/2020 6:08:00 PM By: Deon Pilling Entered By: Deon Pilling on 06/11/2020 14:15:41 -------------------------------------------------------------------------------- Problem List Details Patient Name: Date of Service: Jonathan [name], Jonathan [name] 06/11/2020 12:30 PM Medical Record Number: [id number] Patient Account Number: [id number] Date of Birth/Sex: Treating RN: 09-Dec-1950 (69 y.o. Male) Deon Pilling Primary Care Provider: Marton Redwood Other Clinician: Referring Provider: Treating Provider/Extender: Estelle Grumbles in Treatment: 19 Active Problems ICD-10 Encounter Code Description Active Date MDM Diagnosis E11.621 Type 2 diabetes mellitus with foot ulcer 01/30/2020 No Yes L97.512 Non-pressure chronic ulcer of other part of right foot with fat layer exposed 01/30/2020 No Yes E11.51 Type 2 diabetes mellitus with diabetic peripheral angiopathy without gangrene 01/30/2020 No Yes Inactive Problems ICD-10 Code Description Active Date Inactive Date L97.411 Non-pressure chronic ulcer of right heel and midfoot limited to breakdown of skin 01/30/2020 01/30/2020 Resolved Problems Electronic Signature(s) Signed: 06/11/2020 5:37:49 PM By: Linton Ham MD Entered By: Linton Ham on 06/11/2020 14:08:01 -------------------------------------------------------------------------------- Progress Note Details Patient Name: Date of Service: Jonathan [name], Jonathan [name] 06/11/2020 12:30 PM Medical Record Number: [id number] Patient Account Number: [id number] Date of Birth/Sex: Treating RN: 1951-04-15 (69 y.o. Male) Deon Pilling Primary Care Provider: Marton Redwood Other  Clinician: Referring Provider: Treating Provider/Extender: Estelle Grumbles in Treatment: 19 Subjective History of Present Illness (HPI) ADMISSION 01/30/2020 This is a 69 year old man who was hospitalized with Covid for a month in January 2021. Subsequently he was discharged to [facility] skilled facility for rehabilitation. His wife states that when he got home he had areas on the right lateral heel, right lateral fifth metatarsal head. They have been using Santyl to the right lateral heel no dressing on the right lateral foot. Our intake nurse discovered in small area on the right lateral great toe. He is minimally ambulatory at this point. He does not have  a known history of PAD although he is a diabetic. 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 Still a small open area on the medial ffirth metatarsal head. But this is also better. He has not heard anything about his arterial studies that we ordered 7/13; patient's review with vein and vascular/noninvasive testing is not till 8/3. Still an open area on the right lateral first met head. The area on the right lateral heel remains closed although it is callused 7/27; arterial studies on 8 3. Still has the open area on the right fifth metatarsal head. We have been using Hydrofera Blue 8/10-Comes a 2 weeks, had his vascular studies done, ABIs on both sides are reasonably good, TBI on the right is 145 Continuing with Hydrofera Blue, the wound is  slightly larger than last time. 8/31; months since I last saw this man. We have been using Hydrofera Blue to the small remaining area on the right fifth metatarsal head. He did have arterial studies done on 8/3 and while these were not terrible they certainly were normal. He is noncompressible on the right but with triphasic waveforms and a TBI of 0.96 on the left he is noncompressible with monophasic waveforms at the PTA triphasic at the dorsalis pedis with a TBI of 0.59 9/14 this patient still has a punched-out area on the right fifth metatarsal head lateral aspect. This is very painful. I looked back on his arterial studies his ABIs were noncompressible but with triphasic waveforms and a TBI of 0.96. We have been using Iodoflex 9/27; this patient has a punched-out wound on the right fifth metatarsal head lateral aspect. Very painful. He is a minimal ambulator and very difficult to pick up on claudication. I have once again reviewed his arterial studies which were really not all that impressive. He is noncompressible on the right but he had triphasic waveforms at the PTA and DP and a great toe pressure of 145 with a TBI of 0.96 we have been using Iodoflex without any improvement 10/5; x-ray from last week suggested osteomyelitis showing lateral demineralization in the fifth metatarsal head. He will need a noncontrasted MRI secondary to severe chronic renal failure last creatinine I see is 4.921 09/22/2019. We have been using Iodoflex 10/19; still using Iodoflex. Comes in today with a post debridement improvement in the wound bed. I removed skin and subcutaneous tissues some denuded tendon. The wound is small punched out with some undermining but generally a lot better surface than 2 weeks ago. I have him on Augmentin due to a plain x-ray that suggested osteomyelitis. His noncontrasted MRI is on 4/26. The PCR culture that I did last time [not bone] group C strep as well as Peptostreptococcus. I have  him on Augmentin 500/125 1 p.o. every 24 after dialysis on dialysis days 11/4; the patient is still using Iodoflex. His MRI shows osteomyelitis of the proximal phalanx of the right fifth toe as well as the head and neck of the fifth metatarsal. I have him on Augmentin not making much progress. I suspect he is  going to require an ID consultation. He is on dialysis Objective Constitutional Vitals Time Taken: 1:02 PM, Height: 71 in, Weight: 145 lbs, BMI: 20.2, Temperature: 97.9 F, Pulse: 89 bpm, Respiratory Rate: 18 breaths/min, Blood Pressure: 135/70 mmHg. Cardiovascular Pedal pulses not palpable. General Notes: Wound exam; right lateral fifth metatarsal head.'s this is still a punched-out wound the surface of this does not look too bad however he is slightly tender around this. There is tendon at the base of this no exposed bone. Integumentary (Hair, Skin) Wound #1 status is Open. Original cause of wound was Gradually Appeared. The wound is located on the Right,Lateral Foot. The wound measures 1.4cm length x 1.4cm width x 0.6cm depth; 1.539cm^2 area and 0.924cm^3 volume. There is tendon and Fat Layer (Subcutaneous Tissue) exposed. There is no tunneling noted, however, there is undermining starting at 6:00 and ending at 12:00 with a maximum distance of 0.4cm. There is a medium amount of serous drainage noted. The wound margin is well defined and not attached to the wound base. There is small (1-33%) pink, pale granulation within the wound bed. There is a large (67-100%) amount of necrotic tissue within the wound bed including Adherent Slough. Assessment Active Problems ICD-10 Type 2 diabetes mellitus with foot ulcer Non-pressure chronic ulcer of other part of right foot with fat layer exposed Type 2 diabetes mellitus with diabetic peripheral angiopathy without gangrene Procedures Wound #1 Pre-procedure diagnosis of Wound #1 is a Diabetic Wound/Ulcer of the Lower Extremity located on the  Right,Lateral Foot .Severity of Tissue Pre Debridement is: Fat layer exposed. There was a Excisional Skin/Subcutaneous Tissue Debridement with a total area of 1.96 sq cm performed by Ricard Dillon., MD. With the following instrument(s): Curette to remove Viable and Non-Viable tissue/material. Material removed includes Subcutaneous Tissue, Slough, Skin: Dermis, Skin: Epidermis, and Fibrin/Exudate after achieving pain control using Lidocaine 4% Topical Solution. A time out was conducted at 13:35, prior to the start of the procedure. A Moderate amount of bleeding was controlled with Pressure. The procedure was tolerated well with a pain level of 0 throughout and a pain level of 0 following the procedure. Post Debridement Measurements: 1.4cm length x 1.4cm width x 0.6cm depth; 0.924cm^3 volume. Character of Wound/Ulcer Post Debridement requires further debridement. Severity of Tissue Post Debridement is: Fat layer exposed. Post procedure Diagnosis Wound #1: Same as Pre-Procedure Plan Follow-up Appointments: Return Appointment in: - Tuesday 06/16/2020. Dressing Change Frequency: Change Dressing every other day. Skin Barriers/Peri-Wound Care: Skin Prep Wound Cleansing: May shower with protection. - use a cast protector on days not changing. May shower and wash wound with soap and water. - with dressings changes. Primary Wound Dressing: Wound #1 Right,Lateral Foot: Iodoflex Secondary Dressing: Foam Border Off-Loading: Open toe surgical shoe to: - right foot Other: - float right heel with pillows to offload pressure to heel while resting in chair and bed. no pressure to right foot. Additional Orders / Instructions: Other: - Stop Augmentin right now for a bone culture to be performed on Tuesday. 1. Wound exam; I am continuing with the Iodoflex 2. I have stopped the Augmentin in preparation for an attempt to get a piece of involved bone for culture next week 3. We will organize a ID  appointment. He does have dialysis which can be used of course for intravenous medication Electronic Signature(s) Signed: 06/11/2020 5:37:49 PM By: Linton Ham MD Entered By: Linton Ham on 06/11/2020 14:12:06 -------------------------------------------------------------------------------- SuperBill Details Patient Name: Date of Service: Jonathan [name], Jonathan [name] 06/11/2020 Medical  Record Number: [id number] Patient Account Number: [id number] Date of Birth/Sex: Treating RN: 1950/09/06 (69 y.o. Male) Deon Pilling Primary Care Provider: Marton Redwood Other Clinician: Referring Provider: Treating Provider/Extender: Estelle Grumbles in Treatment: 19 Diagnosis Coding ICD-10 Codes Code Description E11.621 Type 2 diabetes mellitus with foot ulcer L97.512 Non-pressure chronic ulcer of other part of right foot with fat layer exposed E11.51 Type 2 diabetes mellitus with diabetic peripheral angiopathy without gangrene Facility Procedures CPT4 Code: [id number] Description: 44171 - DEB SUBQ TISSUE 20 SQ CM/< ICD-10 Diagnosis Description L97.512 Non-pressure chronic ulcer of other part of right foot with fat layer exposed Modifier: Quantity: 1 Physician Procedures : CPT4 Code Description Modifier [id number] - WC PHYS SUBQ TISS 20 SQ CM ICD-10 Diagnosis Description L97.512 Non-pressure chronic ulcer of other part of right foot with fat layer exposed Quantity: 1 Electronic Signature(s) Signed: 06/11/2020 5:37:49 PM By: Linton Ham MD Entered By: Linton Ham on 06/11/2020 14:12:15

## 2020-06-12 NOTE — Progress Notes (Signed)
Jonathan [name], Jonathan [name] ([id number]) Visit Report for 06/11/2020 Arrival Information Details Patient Name: Date of Service: Jonathan [name], Jonathan [name] 06/11/2020 12:30 PM Medical Record Number: [id number] Patient Account Number: [id number] Date of Birth/Sex: Treating RN: 1950/10/25 (69 y.o. Male) Carlene Coria Primary Care Arlethia Basso: Marton Redwood Other Clinician: Referring Denice Cardon: Treating Zaidan Keeble/Extender: Estelle Grumbles in Treatment: 58 Visit Information History Since Last Visit All ordered tests and consults were completed: No Patient Arrived: Kasandra Knudsen Added or deleted any medications: No Arrival Time: 13:02 Any new allergies or adverse reactions: No Accompanied By: wife Had a fall or experienced change in No Transfer Assistance: None activities of daily living that may affect Patient Identification Verified: Yes risk of falls: Secondary Verification Process Completed: Yes Signs or symptoms of abuse/neglect since last visito No Patient Requires Transmission-Based No Hospitalized since last visit: No Precautions: Implantable device outside of the clinic excluding No Patient Has Alerts: Yes cellular tissue based products placed in the center Patient Alerts: Patient on Blood Thinner since last visit: Right ABI: non compress Has Dressing in Place as Prescribed: Yes Pain Present Now: No Electronic Signature(s) Signed: 06/12/2020 5:00:24 PM By: Carlene Coria RN Entered By: Carlene Coria on 06/11/2020 13:02:52 -------------------------------------------------------------------------------- Encounter Discharge Information Details Patient Name: Date of Service: Jonathan [name], Jonathan [name] 06/11/2020 12:30 PM Medical Record Number: [id number] Patient Account Number: [id number] Date of Birth/Sex: Treating RN: [birth year] (69 y.o. Male) Baruch Gouty Primary Care Layali Freund: Marton Redwood Other Clinician: Referring Danzig Macgregor: Treating Vonita Calloway/Extender: Estelle Grumbles in Treatment:  19 Encounter Discharge Information Items Post Procedure Vitals Discharge Condition: Stable Temperature (F): 97.9 Ambulatory Status: Cane Pulse (bpm): 89 Discharge Destination: Home Respiratory Rate (breaths/min): 18 Transportation: Private Auto Blood Pressure (mmHg): 135/70 Accompanied By: spouse Schedule Follow-up Appointment: Yes Clinical Summary of Care: Patient Declined Electronic Signature(s) Signed: 06/11/2020 5:54:34 PM By: Baruch Gouty RN, BSN Entered By: Baruch Gouty on 06/11/2020 13:57:35 -------------------------------------------------------------------------------- Lower Extremity Assessment Details Patient Name: Date of Service: Jonathan [name], Jonathan [name] 06/11/2020 12:30 PM Medical Record Number: [id number] Patient Account Number: [id number] Date of Birth/Sex: Treating RN: 1951/03/04 (69 y.o. Male) Carlene Coria Primary Care Araya Roel: Marton Redwood Other Clinician: Referring Kimari Lienhard: Treating Kiriana Worthington/Extender: Estelle Grumbles in Treatment: 19 Edema Assessment Assessed: Shirlyn Goltz: No] Patrice Paradise: No] Edema: [Left: N] [Right: o] Calf Left: Right: Point of Measurement: 41 cm From Medial Instep 30 cm Ankle Left: Right: Point of Measurement: 18 cm From Medial Instep 19 cm Electronic Signature(s) Signed: 06/12/2020 5:00:24 PM By: Carlene Coria RN Entered By: Carlene Coria on 06/11/2020 13:09:06 -------------------------------------------------------------------------------- [place] Details Patient Name: Date of Service: Jonathan [name], Jonathan [name] 06/11/2020 12:30 PM Medical Record Number: [id number] Patient Account Number: [id number] Date of Birth/Sex: Treating RN: 1951/01/27 (69 y.o. Male) Deon Pilling Primary Care Jaleya Pebley: Marton Redwood Other Clinician: Referring Mailynn Everly: Treating Kia Varnadore/Extender: Estelle Grumbles in Treatment: 19 Active Inactive Wound/Skin Impairment Nursing Diagnoses: Knowledge deficit related to  ulceration/compromised skin integrity Goals: Patient/caregiver will verbalize understanding of skin care regimen Date Initiated: 01/30/2020 Target Resolution Date: 07/30/2020 Goal Status: Active Ulcer/skin breakdown will have a volume reduction of 50% by week 8 Date Initiated: 01/30/2020 Date Inactivated: 02/18/2020 Target Resolution Date: 03/31/2020 Goal Status: Unmet Unmet Reason: comorbities Ulcer/skin breakdown will have a volume reduction of 80% by week 12 Date Initiated: 02/18/2020 Date Inactivated: 04/07/2020 Target Resolution Date: 03/20/2020 Goal Status: Unmet Unmet Reason: comorbities Ulcer/skin breakdown will heal within 14 weeks Date Initiated: 04/07/2020 Date Inactivated: 04/21/2020 Target Resolution Date: 05/07/2020 Goal Status: Unmet Unmet Reason: comorbities Interventions:  Assess patient/caregiver ability to obtain necessary supplies Assess patient/caregiver ability to perform ulcer/skin care regimen upon admission and as needed Provide education on ulcer and skin care Treatment Activities: Skin care regimen initiated : 01/30/2020 Topical wound management initiated : 01/30/2020 Notes: Electronic Signature(s) Signed: 06/11/2020 6:08:00 PM By: Deon Pilling Entered By: Deon Pilling on 06/11/2020 13:15:26 -------------------------------------------------------------------------------- Pain Assessment Details Patient Name: Date of Service: Jonathan [name], Jonathan [name] 06/11/2020 12:30 PM Medical Record Number: [id number] Patient Account Number: [id number] Date of Birth/Sex: Treating RN: 30-Apr-1951 (69 y.o. Male) Carlene Coria Primary Care Ruddy Swire: Marton Redwood Other Clinician: Referring Ashling Roane: Treating Sweta Halseth/Extender: Estelle Grumbles in Treatment: 19 Active Problems Location of Pain Severity and Description of Pain Patient Has Paino No Site Locations Pain Management and Medication Current Pain Management: Electronic Signature(s) Signed: 06/12/2020  5:00:24 PM By: Carlene Coria RN Entered By: Carlene Coria on 06/11/2020 13:03:23 -------------------------------------------------------------------------------- Patient/Caregiver Education Details Patient Name: Date of Service: Jonathan [name], Jonathan [name] 11/4/2021andnbsp12:30 PM Medical Record Number: [id number] Patient Account Number: [id number] Date of Birth/Gender: Treating RN: [birth year] (69 y.o. Male) Deon Pilling Primary Care Physician: Marton Redwood Other Clinician: Referring Physician: Treating Physician/Extender: Estelle Grumbles in Treatment: 19 Education Assessment Education Provided To: Patient Education Topics Provided Wound/Skin Impairment: Handouts: Skin Care Do's and Dont's Methods: Explain/Verbal Responses: Reinforcements needed Electronic Signature(s) Signed: 06/11/2020 6:08:00 PM By: Deon Pilling Entered By: Deon Pilling on 06/11/2020 13:16:05 -------------------------------------------------------------------------------- Wound Assessment Details Patient Name: Date of Service: Jonathan [name], Jonathan [name] 06/11/2020 12:30 PM Medical Record Number: [id number] Patient Account Number: [id number] Date of Birth/Sex: Treating RN: [birth year] (69 y.o. Male) Carlene Coria Primary Care Latarsha Zani: Marton Redwood Other Clinician: Referring Gyselle Matthew: Treating Srihaan Mastrangelo/Extender: Estelle Grumbles in Treatment: 19 Wound Status Wound Number: 1 Primary Diabetic Wound/Ulcer of the Lower Extremity Etiology: Wound Location: Right, Lateral Foot Wound Open Wounding Event: Gradually Appeared Status: Date Acquired: 12/26/2019 Comorbid Cataracts, Congestive Heart Failure, Hypertension, Myocardial Weeks Of Treatment: 19 History: Infarction, Hepatitis C, Type II Diabetes, End Stage Renal Clustered Wound: No Disease, Dementia, Neuropathy Wound Measurements Length: (cm) 1.4 Width: (cm) 1.4 Depth: (cm) 0.6 Area: (cm) 1.539 Volume: (cm) 0.924 % Reduction in  Area: -880.3% % Reduction in Volume: -1366.7% Epithelialization: Small (1-33%) Tunneling: No Undermining: Yes Starting Position (o'clock): 6 Ending Position (o'clock): 12 Maximum Distance: (cm) 0.4 Wound Description Classification: Grade 2 Wound Margin: Well defined, not attached Exudate Amount: Medium Exudate Type: Serous Exudate Color: amber Foul Odor After Cleansing: No Slough/Fibrino Yes Wound Bed Granulation Amount: Small (1-33%) Exposed Structure Granulation Quality: Pink, Pale Fascia Exposed: No Necrotic Amount: Large (67-100%) Fat Layer (Subcutaneous Tissue) Exposed: Yes Necrotic Quality: Adherent Slough Tendon Exposed: Yes Muscle Exposed: No Joint Exposed: No Bone Exposed: No Treatment Notes Wound #1 (Right, Lateral Foot) 2. Periwound Care Skin Prep 3. Primary Dressing Applied Iodoflex 4. Secondary Dressing Foam Border Dressing 7. Footwear/Offloading device applied Surgical shoe Electronic Signature(s) Signed: 06/12/2020 5:00:24 PM By: Carlene Coria RN Entered By: Carlene Coria on 06/11/2020 13:10:26 -------------------------------------------------------------------------------- Vitals Details Patient Name: Date of Service: Jonathan [name], Jonathan [name] 06/11/2020 12:30 PM Medical Record Number: [id number] Patient Account Number: [id number] Date of Birth/Sex: Treating RN: January 09, 1951 (69 y.o. Male) Carlene Coria Primary Care Lyell Clugston: Marton Redwood Other Clinician: Referring Emerly Prak: Treating Jaquavian Firkus/Extender: Estelle Grumbles in Treatment: 19 Vital Signs Time Taken: 13:02 Temperature (F): 97.9 Height (in): 71 Pulse (bpm): 89 Weight (lbs): 145 Respiratory Rate (breaths/min): 18 Body Mass Index (BMI): 20.2 Blood Pressure (mmHg): 135/70 Reference Range: 80 - 120 mg /  dl Electronic Signature(s) Signed: 06/12/2020 5:00:24 PM By: Carlene Coria RN Entered By: Carlene Coria on 06/11/2020 13:03:17

## 2020-06-15 ENCOUNTER — Telehealth: Payer: Self-pay

## 2020-06-15 NOTE — Telephone Encounter (Signed)
[facility] Patient Teacher, English as a foreign language completed.    The patient is insured through [facility]. All of the Hep-C medication will need a PA through [facility].  We will continue to follow to see if copay assistance is needed.  Ileene Patrick, [facility] Patient [facility] Phone: [phone] Fax:  [phone]

## 2020-06-16 ENCOUNTER — Encounter (HOSPITAL_BASED_OUTPATIENT_CLINIC_OR_DEPARTMENT_OTHER): Payer: Medicare Other | Admitting: Internal Medicine

## 2020-06-16 ENCOUNTER — Other Ambulatory Visit (HOSPITAL_BASED_OUTPATIENT_CLINIC_OR_DEPARTMENT_OTHER): Payer: Self-pay | Admitting: Internal Medicine

## 2020-06-16 ENCOUNTER — Other Ambulatory Visit (HOSPITAL_COMMUNITY)
Admission: RE | Admit: 2020-06-16 | Discharge: 2020-06-16 | Disposition: A | Discharge status: Home / Self Care | Payer: Medicare Other | Source: Other Acute Inpatient Hospital | Attending: Internal Medicine | Admitting: Internal Medicine

## 2020-06-16 ENCOUNTER — Other Ambulatory Visit: Payer: Self-pay

## 2020-06-16 DIAGNOSIS — L089 Local infection of the skin and subcutaneous tissue, unspecified: Secondary | ICD-10-CM | POA: Insufficient documentation

## 2020-06-16 DIAGNOSIS — E11621 Type 2 diabetes mellitus with foot ulcer: Secondary | ICD-10-CM | POA: Diagnosis not present

## 2020-06-17 NOTE — Progress Notes (Signed)
Jonathan, [name] ([id number]) Visit Report for 06/16/2020 Debridement Details Patient Name: Date of Service: Jonathan [name], Jonathan [name] 06/16/2020 2:15 PM Medical Record Number: [id number] Patient Account Number: [id number] Date of Birth/Sex: Treating RN: [birth year] (69 y.o. Jonathan [name]) Carlene Coria Primary Care Provider: Marton Redwood Other Clinician: Referring Provider: Treating Provider/Extender: Estelle Grumbles in Treatment: 19 Debridement Performed for Assessment: Wound #1 Right,Lateral Foot Performed By: Physician Ricard Dillon., MD Debridement Type: Debridement Severity of Tissue Pre Debridement: Fat layer exposed Level of Consciousness (Pre-procedure): Awake and Alert Pre-procedure Verification/Time Out Yes - 15:39 Taken: Start Time: 15:39 Pain Control: Lidocaine 5% topical ointment T Area Debrided (L x W): otal 1.3 (cm) x 1.3 (cm) = 1.69 (cm) Tissue and other material debrided: Viable, Non-Viable, Bone, Subcutaneous, Skin: Dermis Level: Skin/Subcutaneous Tissue/Muscle/Bone Debridement Description: Excisional Instrument: Blade, Curette, Forceps, Rongeur Specimen: Tissue Culture Number of Specimens T aken: 2 Bleeding: Moderate Hemostasis Achieved: Pressure End Time: 15:46 Procedural Pain: 0 Post Procedural Pain: 0 Response to Treatment: Procedure was tolerated well Level of Consciousness (Post- Awake and Alert procedure): Post Debridement Measurements of Total Wound Length: (cm) 1.3 Width: (cm) 1.3 Depth: (cm) 0.6 Volume: (cm) 0.796 Character of Wound/Ulcer Post Debridement: Improved Severity of Tissue Post Debridement: Fat layer exposed Post Procedure Diagnosis Same as Pre-procedure Electronic Signature(s) Signed: 06/17/2020 9:36:19 AM By: Linton Ham MD Signed: 06/17/2020 5:51:14 PM By: Carlene Coria RN Entered By: Linton Ham on 06/16/2020 16:20:06 -------------------------------------------------------------------------------- HPI Details Patient  Name: Date of Service: Jonathan, GELLNER [name] 06/16/2020 2:15 PM Medical Record Number: [id number] Patient Account Number: [id number] Date of Birth/Sex: Treating RN: [birth year] (69 y.o. [name] Primary Care Provider: Marton Redwood Other Clinician: Referring Provider: Treating Provider/Extender: Estelle Grumbles in Treatment: 2 History of Present Illness HPI Description: ADMISSION 01/30/2020 This is a 69 year old man who was hospitalized with Covid for a month in January 2021. Subsequently he was discharged to [facility] skilled facility for rehabilitation. His wife states that when he got home he had areas on the right lateral heel, right lateral fifth metatarsal head. They have been using Santyl to the right lateral heel no dressing on the right lateral foot. Our intake nurse discovered in small area on the right lateral great toe. He is minimally ambulatory at this point. He does not have a known history of PAD although he is a diabetic. 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 Still a small open area on the medial ffirth metatarsal head. But this is also better. He has not heard anything about his arterial studies that we ordered 7/13; patient's review with vein and vascular/noninvasive testing is not till 8/3. Still an open area on the right lateral first met head. The area on the right lateral heel remains closed although it is callused 7/27; arterial studies on 8 3. Still has the open  area on the right fifth metatarsal head. We have been using Hydrofera Blue 8/10-Comes a 2 weeks, had his vascular studies done, ABIs on both sides are reasonably good, TBI on the right is 145 Continuing with  Hydrofera Blue, the wound is slightly larger than last time. 8/31; months since I last saw this man. We have been using Hydrofera Blue to the small remaining area on the right fifth metatarsal head. He did have arterial studies done on 8/3 and while these were not terrible they certainly were normal. He is noncompressible on the right but with triphasic waveforms and a TBI of 0.96 on the left he is noncompressible with monophasic waveforms at the PTA triphasic at the dorsalis pedis with a TBI of 0.59 9/14 this patient still has a punched-out area on the right fifth metatarsal head lateral aspect. This is very painful. I looked back on his arterial studies his ABIs were noncompressible but with triphasic waveforms and a TBI of 0.96. We have been using Iodoflex 9/27; this patient has a punched-out wound on the right fifth metatarsal head lateral aspect. Very painful. He is a minimal ambulator and very difficult to pick up on claudication. I have once again reviewed his arterial studies which were really not all that impressive. He is noncompressible on the right but he had triphasic waveforms at the PTA and DP and a great toe pressure of 145 with a TBI of 0.96 we have been using Iodoflex without any improvement 10/5; x-ray from last week suggested osteomyelitis showing lateral demineralization in the fifth metatarsal head. He will need a noncontrasted MRI secondary to severe chronic renal failure last creatinine I see is 4.921 09/22/2019. We have been using Iodoflex 10/19; still using Iodoflex. Comes in today with a post debridement improvement in the wound bed. I removed skin and subcutaneous tissues some denuded tendon. The wound is small punched out with some undermining but generally a lot better  surface than 2 weeks ago. I have him on Augmentin due to a plain x-ray that suggested osteomyelitis. His noncontrasted MRI is on 4/26. The PCR culture that I did last time [not bone] group C strep as well as Peptostreptococcus. I have him on Augmentin 500/125 1 p.o. every 24 after dialysis on dialysis days 11/4; the patient is still using Iodoflex. His MRI shows osteomyelitis of the proximal phalanx of the right fifth toe as well as the head and neck of the fifth metatarsal. I have him on Augmentin not making much progress. I suspect he is going to require an ID consultation. He is on dialysis 11/9; I stopped his Augmentin last week. T oday I used rongeurs in order to get a small piece of bone for culture and pathology. We have been using Iodoflex to the wound surface Electronic Signature(s) Signed: 06/17/2020 9:36:19 AM By: Linton Ham MD Entered By: Linton Ham on 06/16/2020 16:21:07 -------------------------------------------------------------------------------- Physical Exam Details Patient Name: Date of Service: KASH, DAVIE 06/16/2020 2:15 PM Medical Record Number: [id number] Patient Account Number: [id number] Date of Birth/Sex: Treating RN: Dec 04, 1950 (69 y.o. [name] Primary Care Provider: Marton Redwood Other Clinician: Referring Provider: Treating Provider/Extender: Estelle Grumbles in Treatment: 19 Constitutional Sitting or standing Blood Pressure is within target range for patient.. Pulse regular and within target range for patient.Marland Kitchen Respirations regular, non-labored and within target range.. Temperature is normal and within the target range for the patient.Marland Kitchen Appears in no distress. Cardiovascular Pedal pulses absent bilaterally.. Notes Wound exam; right lateral fifth metatarsal head. I used a #10 scalpel to remove callus and skin from the wound edge. I made a small opening in tendon which is denuded. I then used rongeurs to remove small  pieces of tissue for  CandS and pathology hopefully bone although it the culture was difficult to tell. If not it was tissue right over the bone. I used a circular nerve block to try and help with the pain Electronic Signature(s) Signed: 06/17/2020 9:36:19 AM By: Linton Ham MD Entered By: Linton Ham on 06/16/2020 16:22:55 -------------------------------------------------------------------------------- Physician Orders Details Patient Name: Date of Service: VARUN, JOURDAN [name] 06/16/2020 2:15 PM Medical Record Number: [id number] Patient Account Number: [id number] Date of Birth/Sex: Treating RN: [birth year] (69 y.o. [name] Primary Care Provider: Marton Redwood Other Clinician: Referring Provider: Treating Provider/Extender: Estelle Grumbles in Treatment: 35 Verbal / Phone Orders: No Diagnosis Coding ICD-10 Coding Code Description E11.621 Type 2 diabetes mellitus with foot ulcer L97.512 Non-pressure chronic ulcer of other part of right foot with fat layer exposed E11.51 Type 2 diabetes mellitus with diabetic peripheral angiopathy without gangrene Follow-up Appointments ppointment in: - Tuesday 06/16/2020. Return A Dressing Change Frequency Change Dressing every other day. Skin Barriers/Peri-Wound Care Skin Prep Wound Cleansing May shower with protection. - use a cast protector on days not changing. May shower and wash wound with soap and water. - with dressings changes. Primary Wound Dressing Wound #1 Right,Lateral Foot Iodoflex Secondary Dressing Foam Border Off-Loading Open toe surgical shoe to: - right foot Other: - float right heel with pillows to offload pressure to heel while resting in chair and bed. no pressure to right foot. Additional Orders / Instructions Other: - Stop Augmentin right now for a bone culture to be performed on Tuesday. Laboratory Bacteria identified in Tissue by Biopsy culture (MICRO) - right lat foot - (ICD10 E11.621 -  Type 2 diabetes mellitus with foot ulcer) LOINC Code: 36144-3 Convenience Name: Biopsy specimen culture naerobe culture (MICRO) - right lat foot - (ICD10 E11.621 - Type 2 diabetes mellitus with foot Bacteria identified in Unspecified specimen by A ulcer) LOINC Code: 154-0 Convenience Name: Anerobic culture Electronic Signature(s) Signed: 06/17/2020 9:36:19 AM By: Linton Ham MD Signed: 06/17/2020 5:51:14 PM By: Carlene Coria RN Entered By: Carlene Coria on 06/16/2020 15:51:10 -------------------------------------------------------------------------------- Problem List Details Patient Name: Date of Service: ZENAS, SANTA [name] 06/16/2020 2:15 PM Medical Record Number: [id number] Patient Account Number: [id number] Date of Birth/Sex: Treating RN: 11-24-1950 (69 y.o. [name] Primary Care Provider: Marton Redwood Other Clinician: Referring Provider: Treating Provider/Extender: Estelle Grumbles in Treatment: 19 Active Problems ICD-10 Encounter Code Description Active Date MDM Diagnosis E11.621 Type 2 diabetes mellitus with foot ulcer 01/30/2020 No Yes L97.512 Non-pressure chronic ulcer of other part of right foot with fat layer exposed 01/30/2020 No Yes E11.51 Type 2 diabetes mellitus with diabetic peripheral angiopathy without gangrene 01/30/2020 No Yes Inactive Problems ICD-10 Code Description Active Date Inactive Date L97.411 Non-pressure chronic ulcer of right heel and midfoot limited to breakdown of skin 01/30/2020 01/30/2020 Resolved Problems Electronic Signature(s) Signed: 06/17/2020 9:36:19 AM By: Linton Ham MD Entered By: Linton Ham on 06/16/2020 16:19:50 -------------------------------------------------------------------------------- Progress Note Details Patient Name: Date of Service: ONEAL, SCHOENBERGER [name] 06/16/2020 2:15 PM Medical Record Number: [id number] Patient Account Number: [id number] Date of Birth/Sex: Treating RN: 12-03-1950 (69 y.o. [name] Primary Care Provider: Marton Redwood Other Clinician: Referring Provider: Treating Provider/Extender: Estelle Grumbles in Treatment: 19 Subjective History of Present Illness (HPI) ADMISSION 01/30/2020 This is a 69 year old man who was hospitalized with Covid for a month in January 2021. Subsequently he was discharged to [facility] skilled facility for rehabilitation. His wife states that when he got home he had areas  on the right lateral heel, right lateral fifth metatarsal head. They have been using Santyl to the right lateral heel no dressing on the right lateral foot. Our intake nurse discovered in small area on the right lateral great toe. He is minimally ambulatory at this point. He does not have a known history of PAD although he is a diabetic. 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 Still a small open area on the medial ffirth metatarsal head. But this is also better. He has not heard anything about his arterial studies that we ordered 7/13; patient's review with vein and vascular/noninvasive testing is not till 8/3. Still an open area on the right lateral first met head. The area on the right lateral heel remains closed although it is callused 7/27; arterial studies on 8 3. Still has the open area on the right fifth metatarsal head. We have been using Hydrofera Blue 8/10-Comes a 2 weeks, had his vascular studies done, ABIs on both sides are reasonably good, TBI on the  right is 145 Continuing with Hydrofera Blue, the wound is slightly larger than last time. 8/31; months since I last saw this man. We have been using Hydrofera Blue to the small remaining area on the right fifth metatarsal head. He did have arterial studies done on 8/3 and while these were not terrible they certainly were normal. He is noncompressible on the right but with triphasic waveforms and a TBI of 0.96 on the left he is noncompressible with monophasic waveforms at the PTA triphasic at the dorsalis pedis with a TBI of 0.59 9/14 this patient still has a punched-out area on the right fifth metatarsal head lateral aspect. This is very painful. I looked back on his arterial studies his ABIs were noncompressible but with triphasic waveforms and a TBI of 0.96. We have been using Iodoflex 9/27; this patient has a punched-out wound on the right fifth metatarsal head lateral aspect. Very painful. He is a minimal ambulator and very difficult to pick up on claudication. I have once again reviewed his arterial studies which were really not all that impressive. He is noncompressible on the right but he had triphasic waveforms at the PTA and DP and a great toe pressure of 145 with a TBI of 0.96 we have been using Iodoflex without any improvement 10/5; x-ray from last week suggested osteomyelitis showing lateral demineralization in the fifth metatarsal head. He will need a noncontrasted MRI secondary to severe chronic renal failure last creatinine I see is 4.921 09/22/2019. We have been using Iodoflex 10/19; still using Iodoflex. Comes in today with a post debridement improvement in the wound bed. I removed skin and subcutaneous tissues some denuded tendon. The wound is small punched out with some undermining but generally a lot better surface than 2 weeks ago. I have him on Augmentin due to a plain x-ray that suggested osteomyelitis. His noncontrasted MRI is on 4/26. The PCR culture that I did last time [not  bone] group C strep as well as Peptostreptococcus. I have him on Augmentin 500/125 1 p.o.  every 24 after dialysis on dialysis days 11/4; the patient is still using Iodoflex. His MRI shows osteomyelitis of the proximal phalanx of the right fifth toe as well as the head and neck of the fifth metatarsal. I have him on Augmentin not making much progress. I suspect he is going to require an ID consultation. He is on dialysis 11/9; I stopped his Augmentin last week. T oday I used rongeurs in order to get a small piece of bone for culture and pathology. We have been using Iodoflex to the wound surface Objective Constitutional Sitting or standing Blood Pressure is within target range for patient.. Pulse regular and within target range for patient.Marland Kitchen Respirations regular, non-labored and within target range.. Temperature is normal and within the target range for the patient.Marland Kitchen Appears in no distress. Vitals Time Taken: 2:33 PM, Height: 71 in, Source: Stated, Weight: 145 lbs, Source: Stated, BMI: 20.2, Temperature: 98.4 F, Pulse: 90 bpm, Respiratory Rate: 18 breaths/min, Blood Pressure: 124/68 mmHg. General Notes: spouse has not check pt's blood sugar for more than one week Cardiovascular Pedal pulses absent bilaterally.. General Notes: Wound exam; right lateral fifth metatarsal head. I used a #10 scalpel to remove callus and skin from the wound edge. I made a small opening in tendon which is denuded. I then used rongeurs to remove small pieces of tissue for CandS and pathology hopefully bone although it the culture was difficult to tell. If not it was tissue right over the bone. I used a circular nerve block to try and help with the pain Integumentary (Hair, Skin) Wound #1 status is Open. Original cause of wound was Gradually Appeared. The wound is located on the Right,Lateral Foot. The wound measures 1.3cm length x 1.3cm width x 0.6cm depth; 1.327cm^2 area and 0.796cm^3 volume. There is Fat Layer  (Subcutaneous Tissue) exposed. There is no tunneling or undermining noted. There is a medium amount of serous drainage noted. The wound margin is well defined and not attached to the wound base. There is no granulation within the wound bed. There is a large (67-100%) amount of necrotic tissue within the wound bed including Adherent Slough. General Notes: callous periwound Assessment Active Problems ICD-10 Type 2 diabetes mellitus with foot ulcer Non-pressure chronic ulcer of other part of right foot with fat layer exposed Type 2 diabetes mellitus with diabetic peripheral angiopathy without gangrene Procedures Wound #1 Pre-procedure diagnosis of Wound #1 is a Diabetic Wound/Ulcer of the Lower Extremity located on the Right,Lateral Foot .Severity of Tissue Pre Debridement is: Fat layer exposed. There was a Excisional Skin/Subcutaneous Tissue/Muscle/Bone Debridement with a total area of 1.69 sq cm performed by Ricard Dillon., MD. With the following instrument(s): Blade, Curette, Forceps, and Rongeur to remove Viable and Non-Viable tissue/material. Material removed includes Bone,Subcutaneous Tissue, and Skin: Dermis after achieving pain control using Lidocaine 5% topical ointment. 2 specimens were taken by a Tissue Culture and sent to the lab per facility protocol. A time out was conducted at 15:39, prior to the start of the procedure. A Moderate amount of bleeding was controlled with Pressure. The procedure was tolerated well with a pain level of 0 throughout and a pain level of 0 following the procedure. Post Debridement Measurements: 1.3cm length x 1.3cm width x 0.6cm depth; 0.796cm^3 volume. Character of Wound/Ulcer Post Debridement is improved. Severity of Tissue Post Debridement is: Fat layer exposed. Post procedure Diagnosis Wound #1: Same as Pre-Procedure Plan Follow-up Appointments: Return Appointment in: - Tuesday 06/16/2020. Dressing Change Frequency: Change Dressing every  other  day. Skin Barriers/Peri-Wound Care: Skin Prep Wound Cleansing: May shower with protection. - use a cast protector on days not changing. May shower and wash wound with soap and water. - with dressings changes. Primary Wound Dressing: Wound #1 Right,Lateral Foot: Iodoflex Secondary Dressing: Foam Border Off-Loading: Open toe surgical shoe to: - right foot Other: - float right heel with pillows to offload pressure to heel while resting in chair and bed. no pressure to right foot. Additional Orders / Instructions: Other: - Stop Augmentin right now for a bone culture to be performed on Tuesday. Laboratory ordered were: Biopsy specimen culture, bone - right lat foot, Anerobic culture - right lat foot #1 I am continuing with the Iodoflex 2. Await bone pathology and culture 3. As far as I can tell there is no major arterial issue here 4. I did discuss the options here to include IV antibiotics at dialysis, possibly hyperbarics although I have not discussed this latter issue. The other issue would be a ray amputation. I will try to approach this again with him next week and see which way they want to move on this. They see infectious disease on 11/11 Electronic Signature(s) Signed: 06/17/2020 9:36:19 AM By: Linton Ham MD Entered By: Linton Ham on 06/16/2020 16:24:19 -------------------------------------------------------------------------------- SuperBill Details Patient Name: Date of Service: BENJERMIN, KORBER [name] 06/16/2020 Medical Record Number: [id number] Patient Account Number: [id number] Date of Birth/Sex: Treating RN: 1950/12/06 (69 y.o. Jonathan [name]) Carlene Coria Primary Care Provider: Marton Redwood Other Clinician: Referring Provider: Treating Provider/Extender: Estelle Grumbles in Treatment: 19 Diagnosis Coding ICD-10 Codes Code Description E11.621 Type 2 diabetes mellitus with foot ulcer L97.512 Non-pressure chronic ulcer of other part of right foot with fat layer  exposed E11.51 Type 2 diabetes mellitus with diabetic peripheral angiopathy without gangrene Facility Procedures CPT4 Code: [id number] ICD Description: 82574 - DEB BONE 20 SQ CM/< -10 Diagnosis Description E11.621 Type 2 diabetes mellitus with foot ulcer L97.512 Non-pressure chronic ulcer of other part of right foot with fat layer exposed Modifier: Quantity: 1 Physician Procedures CPT4: Description Modifier Code [id number] Debridement; bone (includes epidermis, dermis, subQ tissue, muscle and/or fascia, if performed) 1st 20 sqcm or less ICD-10 Diagnosis Description E11.621 Type 2 diabetes mellitus with foot ulcer L97.512 Non-pressure  chronic ulcer of other part of right foot with fat layer exposed Quantity: 1 Electronic Signature(s) Signed: 06/17/2020 9:36:19 AM By: Linton Ham MD Entered By: Linton Ham on 06/16/2020 16:24:40

## 2020-06-17 NOTE — Progress Notes (Signed)
Jonathan [name], Jonathan [name] ([id number]) Visit Report for 06/16/2020 Arrival Information Details Patient Name: Date of Service: Jonathan [name], Jonathan [name] 06/16/2020 2:15 PM Medical Record Number: [id number] Patient Account Number: [id number] Date of Birth/Sex: Treating RN: 1951/06/07 (69 y.o. [name] Primary Care Mariea Mcmartin: Marton Redwood Other Clinician: Referring Drew Lips: Treating Brunette Lavalle/Extender: Estelle Grumbles in Treatment: 81 Visit Information History Since Last Visit Added or deleted any medications: No Patient Arrived: Kasandra Knudsen Any new allergies or adverse reactions: No Arrival Time: 14:32 Had a fall or experienced change in No Accompanied By: spouse activities of daily living that may affect Transfer Assistance: None risk of falls: Patient Identification Verified: Yes Signs or symptoms of abuse/neglect since No Secondary Verification Process Completed: Yes last visito Patient Requires Transmission-Based Precautions: No Hospitalized since last visit: No Patient Has Alerts: Yes Implantable device outside of the clinic No Patient Alerts: Patient on Blood Thinner excluding Right ABI: non compress cellular tissue based products placed in the center since last visit: Has Dressing in Place as Prescribed: Yes Has Footwear/Offloading in Place as Yes Prescribed: Right: Surgical Shoe with Pressure Relief Insole Pain Present Now: No Electronic Signature(s) Signed: 06/17/2020 5:57:36 PM By: Baruch Gouty RN, BSN Entered By: Baruch Gouty on 06/16/2020 14:33:47 -------------------------------------------------------------------------------- Encounter Discharge Information Details Patient Name: Date of Service: Jonathan [name], Jonathan [name] 06/16/2020 2:15 PM Medical Record Number: [id number] Patient Account Number: [id number] Date of Birth/Sex: Treating RN: Sep 06, 1950 (69 y.o. Hessie Diener Primary Care Kawan Valladolid: Marton Redwood Other Clinician: Referring Hamsini Verrilli: Treating  Dalon Reichart/Extender: Estelle Grumbles in Treatment: 19 Encounter Discharge Information Items Post Procedure Vitals Discharge Condition: Stable Temperature (F): 98.4 Ambulatory Status: Cane Pulse (bpm): 90 Discharge Destination: Home Respiratory Rate (breaths/min): 18 Transportation: Private Auto Blood Pressure (mmHg): 124/68 Accompanied By: wife Schedule Follow-up Appointment: Yes Clinical Summary of Care: Electronic Signature(s) Signed: 06/16/2020 5:43:21 PM By: Deon Pilling Entered By: Deon Pilling on 06/16/2020 16:30:15 -------------------------------------------------------------------------------- Lower Extremity Assessment Details Patient Name: Date of Service: Jonathan [name], Jonathan [name] 06/16/2020 2:15 PM Medical Record Number: [id number] Patient Account Number: [id number] Date of Birth/Sex: Treating RN: 1951-02-17 (69 y.o. [name] Primary Care Nasira Janusz: Marton Redwood Other Clinician: Referring Williom Cedar: Treating Josephanthony Tindel/Extender: Estelle Grumbles in Treatment: 19 Edema Assessment Assessed: Shirlyn Goltz: No] Patrice Paradise: No] Edema: [Left: N] [Right: o] Calf Left: Right: Point of Measurement: 41 cm From Medial Instep 30 cm Ankle Left: Right: Point of Measurement: 18 cm From Medial Instep 19 cm Vascular Assessment Pulses: Dorsalis Pedis Palpable: [Right:Yes] Electronic Signature(s) Signed: 06/17/2020 5:57:36 PM By: Baruch Gouty RN, BSN Entered By: Baruch Gouty on 06/16/2020 14:39:07 -------------------------------------------------------------------------------- Multi Wound Chart Details Patient Name: Date of Service: Jonathan [name], Jonathan [name] 06/16/2020 2:15 PM Medical Record Number: [id number] Patient Account Number: [id number] Date of Birth/Sex: Treating RN: [birth year] (69 y.o. [name]) Carlene Coria Primary Care Aristeo Hankerson: Marton Redwood Other Clinician: Referring Kristyn Obyrne: Treating Archit Leger/Extender: Estelle Grumbles in  Treatment: 19 Vital Signs Height(in): 71 Pulse(bpm): 90 Weight(lbs): 145 Blood Pressure(mmHg): 124/68 Body Mass Index(BMI): 20 Temperature(F): 98.4 Respiratory Rate(breaths/min): 18 Photos: [1:No Photos Right, Lateral Foot] [N/A:N/A N/A] Wound Location: [1:Gradually Appeared] [N/A:N/A] Wounding Event: [1:Diabetic Wound/Ulcer of the Lower] [N/A:N/A] Primary Etiology: [1:Extremity Cataracts, Congestive Heart Failure,] [N/A:N/A] Comorbid History: [1:Hypertension, Myocardial Infarction, Hepatitis C, Type II Diabetes, End Stage Renal Disease, Dementia, Neuropathy 12/26/2019] [N/A:N/A] Date Acquired: [1:19] [N/A:N/A] Weeks of Treatment: [1:Open] [N/A:N/A] Wound Status: [1:1.3x1.3x0.6] [N/A:N/A] Measurements L x W x D (cm) [1:1.327] [N/A:N/A] A (cm) : rea [1:0.796] [N/A:N/A] Volume (cm) : [1:-745.20%] [N/A:N/A] %  Reduction in A [1:rea: -1163.50%] [N/A:N/A] % Reduction in Volume: [1:Grade 2] [N/A:N/A] Classification: [1:Medium] [N/A:N/A] Exudate A mount: [1:Serous] [N/A:N/A] Exudate Type: [1:amber] [N/A:N/A] Exudate Color: [1:Well defined, not attached] [N/A:N/A] Wound Margin: [1:None Present (0%)] [N/A:N/A] Granulation A mount: [1:Large (67-100%)] [N/A:N/A] Necrotic A mount: [1:Fat Layer (Subcutaneous Tissue): Yes N/A] Exposed Structures: [1:Fascia: No Tendon: No Muscle: No Joint: No Bone: No None] [N/A:N/A] Epithelialization: [1:Debridement - Excisional] [N/A:N/A] Debridement: Pre-procedure Verification/Time Out 15:39 [N/A:N/A] Taken: [1:Lidocaine 5% topical ointment] [N/A:N/A] Pain Control: [1:Bone, Subcutaneous] [N/A:N/A] Tissue Debrided: [1:Skin/Subcutaneous] [N/A:N/A] Level: [1:Tissue/Muscle/Bone 1.69] [N/A:N/A] Debridement A (sq cm): [1:rea Blade, Curette, Forceps, Rongeur] [N/A:N/A] Instrument: [1:Moderate] [N/A:N/A] Bleeding: [1:Pressure] [N/A:N/A] Hemostasis Achieved: [1:0] [N/A:N/A] Procedural Pain: [1:0] [N/A:N/A] Post Procedural Pain: Debridement Treatment  Response: Procedure was tolerated well [N/A:N/A] Post Debridement Measurements L x 1.3x1.3x0.6 [N/A:N/A] W x D (cm) [1:0.796] [N/A:N/A] Post Debridement Volume: (cm) [1:callous periwound] [N/A:N/A] Assessment Notes: [1:Debridement] [N/A:N/A] Treatment Notes Electronic Signature(s) Signed: 06/17/2020 9:36:19 AM By: Linton Ham MD Signed: 06/17/2020 5:51:14 PM By: Carlene Coria RN Entered By: Linton Ham on 06/16/2020 16:19:57 -------------------------------------------------------------------------------- Multi-Disciplinary Care Plan Details Patient Name: Date of Service: Jonathan [name], Jonathan [name] 06/16/2020 2:15 PM Medical Record Number: [id number] Patient Account Number: [id number] Date of Birth/Sex: Treating RN: [birth year] (69 y.o. [name] Primary Care Carlise Stofer: Marton Redwood Other Clinician: Referring Stephanye Finnicum: Treating Nancy Manuele/Extender: Estelle Grumbles in Treatment: 19 Active Inactive Wound/Skin Impairment Nursing Diagnoses: Knowledge deficit related to ulceration/compromised skin integrity Goals: Patient/caregiver will verbalize understanding of skin care regimen Date Initiated: 01/30/2020 Target Resolution Date: 07/30/2020 Goal Status: Active Ulcer/skin breakdown will have a volume reduction of 50% by week 8 Date Initiated: 01/30/2020 Date Inactivated: 02/18/2020 Target Resolution Date: 03/31/2020 Goal Status: Unmet Unmet Reason: comorbities Ulcer/skin breakdown will have a volume reduction of 80% by week 12 Date Initiated: 02/18/2020 Date Inactivated: 04/07/2020 Target Resolution Date: 03/20/2020 Goal Status: Unmet Unmet Reason: comorbities Ulcer/skin breakdown will heal within 14 weeks Date Initiated: 04/07/2020 Date Inactivated: 04/21/2020 Target Resolution Date: 05/07/2020 Goal Status: Unmet Unmet Reason: comorbities Interventions: Assess patient/caregiver ability to obtain necessary supplies Assess patient/caregiver ability to perform  ulcer/skin care regimen upon admission and as needed Provide education on ulcer and skin care Treatment Activities: Skin care regimen initiated : 01/30/2020 Topical wound management initiated : 01/30/2020 Notes: Electronic Signature(s) Signed: 06/17/2020 5:51:14 PM By: Carlene Coria RN Entered By: Carlene Coria on 06/16/2020 15:02:22 -------------------------------------------------------------------------------- Pain Assessment Details Patient Name: Date of Service: Jonathan [name], Jonathan [name] 06/16/2020 2:15 PM Medical Record Number: [id number] Patient Account Number: [id number] Date of Birth/Sex: Treating RN: June 26, 1951 (69 y.o. [name] Primary Care Ella Guillotte: Marton Redwood Other Clinician: Referring Ronnae Kaser: Treating Arrion Burruel/Extender: Estelle Grumbles in Treatment: 19 Active Problems Location of Pain Severity and Description of Pain Patient Has Paino No Site Locations Rate the pain. Current Pain Level: 0 Pain Management and Medication Current Pain Management: Electronic Signature(s) Signed: 06/17/2020 5:57:36 PM By: Baruch Gouty RN, BSN Entered By: Baruch Gouty on 06/16/2020 14:34:51 -------------------------------------------------------------------------------- Patient/Caregiver Education Details Patient Name: Date of Service: Jonathan [name], Jonathan [name] 11/9/2021andnbsp2:15 PM Medical Record Number: [id number] Patient Account Number: [id number] Date of Birth/Gender: Treating RN: [birth year] (69 y.o. [name]) Carlene Coria Primary Care Physician: Marton Redwood Other Clinician: Referring Physician: Treating Physician/Extender: Estelle Grumbles in Treatment: 19 Education Assessment Education Provided To: Patient Education Topics Provided Wound/Skin Impairment: Methods: Explain/Verbal Responses: State content correctly Electronic Signature(s) Signed: 06/17/2020 5:51:14 PM By: Carlene Coria RN Entered By: Carlene Coria on 06/16/2020  15:02:39 -------------------------------------------------------------------------------- Wound  Assessment Details Patient Name: Date of Service: Jonathan [name], Jonathan [name] 06/16/2020 2:15 PM Medical Record Number: [id number] Patient Account Number: [id number] Date of Birth/Sex: Treating RN: 03-Jul-1951 (70 y.o. [name] Primary Care Chereese Cilento: Marton Redwood Other Clinician: Referring Sheril Hammond: Treating Burhan Barham/Extender: Estelle Grumbles in Treatment: 19 Wound Status Wound Number: 1 Primary Diabetic Wound/Ulcer of the Lower Extremity Etiology: Wound Location: Right, Lateral Foot Wound Open Wounding Event: Gradually Appeared Status: Date Acquired: 12/26/2019 Comorbid Cataracts, Congestive Heart Failure, Hypertension, Myocardial Weeks Of Treatment: 19 History: Infarction, Hepatitis C, Type II Diabetes, End Stage Renal Disease, Clustered Wound: No Dementia, Neuropathy Wound Measurements Length: (cm) 1.3 Width: (cm) 1.3 Depth: (cm) 0.6 Area: (cm) 1.327 Volume: (cm) 0.796 % Reduction in Area: -745.2% % Reduction in Volume: -1163.5% Epithelialization: None Tunneling: No Undermining: No Wound Description Classification: Grade 2 Wound Margin: Well defined, not attached Exudate Amount: Medium Exudate Type: Serous Exudate Color: amber Wound Bed Granulation Amount: None Present (0%) Necrotic Amount: Large (67-100%) Necrotic Quality: Adherent Slough Foul Odor After Cleansing: No Slough/Fibrino Yes Exposed Structure Fascia Exposed: No Fat Layer (Subcutaneous Tissue) Exposed: Yes Tendon Exposed: No Muscle Exposed: No Joint Exposed: No Bone Exposed: No Assessment Notes callous periwound Treatment Notes Wound #1 (Right, Lateral Foot) 1. Cleanse With Wound Cleanser 3. Primary Dressing Applied Iodoflex 4. Secondary Dressing ABD Pad Dry Gauze Roll Gauze 5. Secured With Medipore tape Notes netting. secondary dressing used related to patient having a  bone biopsy today. Electronic Signature(s) Signed: 06/17/2020 5:57:36 PM By: Baruch Gouty RN, BSN Entered By: Baruch Gouty on 06/16/2020 14:39:58 -------------------------------------------------------------------------------- [place] Details Patient Name: Date of Service: Jonathan [name], Jonathan [name] 06/16/2020 2:15 PM Medical Record Number: [id number] Patient Account Number: [id number] Date of Birth/Sex: Treating RN: 1951/03/05 (69 y.o. [name] Primary Care Erum Cercone: Marton Redwood Other Clinician: Referring Jozelynn Danielson: Treating Tasheka Houseman/Extender: Estelle Grumbles in Treatment: 19 Vital Signs Time Taken: 14:33 Temperature (F): 98.4 Height (in): 71 Pulse (bpm): 90 Source: Stated Respiratory Rate (breaths/min): 18 Weight (lbs): 145 Blood Pressure (mmHg): 124/68 Source: Stated Reference Range: 80 - 120 mg / dl Body Mass Index (BMI): 20.2 Notes spouse has not check pt's blood sugar for more than one week Electronic Signature(s) Signed: 06/17/2020 5:57:36 PM By: Baruch Gouty RN, BSN Entered By: Baruch Gouty on 06/16/2020 14:34:36

## 2020-06-18 ENCOUNTER — Encounter: Payer: Self-pay | Admitting: Internal Medicine

## 2020-06-18 ENCOUNTER — Other Ambulatory Visit: Payer: Self-pay

## 2020-06-18 ENCOUNTER — Ambulatory Visit: Payer: Medicare Other | Admitting: Internal Medicine

## 2020-06-18 VITALS — BP 111/63 | HR 89 | Temp 98.2°F

## 2020-06-18 DIAGNOSIS — E08621 Diabetes mellitus due to underlying condition with foot ulcer: Secondary | ICD-10-CM | POA: Diagnosis not present

## 2020-06-18 DIAGNOSIS — L97511 Non-pressure chronic ulcer of other part of right foot limited to breakdown of skin: Secondary | ICD-10-CM | POA: Diagnosis not present

## 2020-06-18 NOTE — Progress Notes (Signed)
[facility]  Reason for Consult:diabetic foot ulcer Referring Provider: Dellia Nims     Patient Active Problem List   Diagnosis Date Noted  . Neuropathic ulcer of foot, unspecified laterality, limited to breakdown of skin ([place]) 06/09/2020  . FUO (fever of unknown origin) 09/17/2019  . Cerebral thrombosis with cerebral infarction 08/02/2019  . Cerebral embolism with cerebral infarction 08/02/2019  . Acute respiratory failure due to COVID-19 ([place]) 07/30/2019  . Acute respiratory failure with hypoxia ([place]) 07/30/2019  . Chronic systolic HF (heart failure) ([place]) 02/25/2019  . ESRD (end stage renal disease) on dialysis ([place]) 02/25/2019  . Generalized abdominal pain 07/16/2018  . Arteriovenous fistula for hemodialysis in place, primary ([place]) 05/15/2018  . 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 Acute blood loss anemia 12/14/2017  . Hypomagnesemia 12/03/2017  . Anticoagulated 12/02/2017  . Stroke ([place]) 11/29/2017  . 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 History of MI (myocardial infarction) 11/28/2017  . Hypertension 11/28/2017  . Chronic hepatitis C without hepatic coma ([place]) 09/25/2017  . Diabetes mellitus type 2, uncomplicated ([place]) 66/59/9357  . Myocardial infarction ([employer]) 09/25/2017  . Renal transplant recipient 09/25/2017  . Transfusion history 09/25/2017  . History of coronary artery disease 09/12/2016  . 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 MI ([place]) 09/12/2016  . Aftercare following organ transplant 09/11/2016  . 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 At risk for infection transmitted from donor 06/16/2014  . Ureteral stent displacement ([place]) 06/10/2014  . Prophylactic antibiotic 06/09/2014  . Immunosuppression ([place]) 06/06/2014    Patient's Medications  New Prescriptions   No medications on file  Previous Medications   ACETAMINOPHEN 325 MG CAPS    Take by mouth.    ALLOPURINOL (ZYLOPRIM) 100 MG TABLET    Take 100 mg by mouth daily.    AMOXICILLIN-CLAVULANATE (AUGMENTIN) 500-125 MG TABLET    Take 1 tablet by mouth daily.   ASPIRIN 81 MG CHEWABLE TABLET    Chew 81 mg by mouth daily.    ATORVASTATIN (LIPITOR) 40 MG TABLET    Take 40 mg by mouth daily.   B COMPLEX-VITAMIN C-FOLIC ACID (NEPHRO-VITE) 0.8 MG TABS TABLET    Take 1 tablet by mouth daily.   GLUCAGON, HUMAN RECOMBINANT, (GLUCAGEN) 1 MG INJECTION    Inject 1 mg into the muscle once as needed for low blood sugar.   INSULIN PEN NEEDLE (BD PEN NEEDLE NANO U/F) 32G X 4 MM MISC    1 each by Other route as needed (insulin).    LEVALBUTEROL (XOPENEX HFA) 45 MCG/ACT INHALER    Inhale 2 puffs into the lungs every 8 (eight) hours as needed for wheezing.   LIDOCAINE-PRILOCAINE (EMLA) CREAM    Apply 1 application topically 3 (three) times a week. 30 minutes prior to Dialysis - MWF   LOSARTAN (COZAAR) 25 MG TABLET    Take 25 mg by mouth daily.    METHOCARBAMOL (ROBAXIN) 500 MG TABLET    Take by mouth.   METOCLOPRAMIDE (REGLAN) 5 MG TABLET    Take 5 mg by mouth 3 (three) times daily with meals.   METOPROLOL SUCCINATE (TOPROL-XL) 50 MG 24 HR TABLET    Take 50 mg by mouth daily.    NOVOLOG FLEXPEN 100 UNIT/ML FLEXPEN    Inject 0-10 Units into the skin as needed for high blood sugar. Per sliding scale   NUTRITIONAL SUPPLEMENTS (FEEDING SUPPLEMENT, NEPRO CARB STEADY,) LIQD  Take 237 mLs by mouth 3 (three) times daily between meals.   PREDNISONE (DELTASONE) 5 MG TABLET    Take by mouth.   SENNA (SENOKOT) 8.6 MG TABLET    Take 1 tablet by mouth as needed for constipation.   SERTRALINE (ZOLOFT) 100 MG TABLET    Take by mouth.   SEVELAMER CARBONATE (RENVELA) 800 MG TABLET    Take 800 mg by mouth 3 (three) times daily.   TRESIBA FLEXTOUCH 200 UNIT/ML FLEXTOUCH PEN    Inject into the skin.   WARFARIN (COUMADIN) 4 MG TABLET    Take 1 tablet (4 mg total) by mouth daily at 6 PM.   WARFARIN (COUMADIN) 6 MG TABLET    Take by  mouth.  Modified Medications   No medications on file  Discontinued Medications   No medications on file    HPI: Jonathan [name]. is a 69 y.o. 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 Has been seeing wound care  Denies pus, foul oder, fever, chill, pain, swelling Nonsmoker First time had foot ulcer  The ulcer has been debrided in the past at bedside. At one point he was given amox-clav for streptococcus species in the culture. There was an mri in October that showed imaging evidence of OM. Dr Dellia Nims stopped the augmentin after ?2 weeks use and send bone cx of the 5th mt on 11/09 which so far has been negative  He is also seeing a podiatrist but only for nail care  He is not off loading at home  Review of Systems: ROS  Negative 11 point ros unless mentioned above  Past Medical History:  Diagnosis Date  . Diabetes mellitus without complication ([place])   . FUO (fever of unknown origin) 09/17/2019  . Hypertension   . Renal disorder 2015   right kidney transplant  . Stroke [facility])     Social History   Tobacco Use  . Smoking status: Former Research scientist (life sciences)  . Smokeless tobacco: Never Used  Substance Use Topics  . Alcohol use: Never  . Drug use: Never    Family History  Problem Relation Age of Onset  . Diabetes Mellitus II Mother   . Diabetes Mellitus II Brother    No Known Allergies  OBJECTIVE: Vitals:   06/18/20 0959  BP: 111/63  Pulse: 89  Temp: 98.2 F (36.8 C)  TempSrc: Oral  SpO2: 99%   There is no height or weight on file to calculate BMI.   Physical Exam No distress, in wheel chair, conversant; wife provides most of the history Normocephalic; per; conj clear; eomi Neck supple cv rrr no mrg Lungs clear abd s/nt Ext no  edema Skin/msk no rash; the right foot lateral mtp joint is a 2 cm clean edge ulcer with necrotic eschar in center and bleeding; no swelling/tenderness/foul odor Neuro cn2-12 intact; mild right side paresis; LE 4-5/5 strength L>R otherwise Psych alert/oriented  Lab:  Microbiology: Recent Results (from the past 240 hour(s))  Aerobic/Anaerobic Culture (surgical/deep wound)     Status: None (Preliminary result)   Collection Time: 06/16/20  3:00 PM   Specimen: Bone  Result Value Ref Range Status   Specimen Description   Final    BONE RIGHT LAT FOOT Performed at [facility],  [place] [address], [place], [place] [address]    Special Requests   Final    NONE Performed at [facility], [place] [address] [place], [place] [address]    Gram Stain NO WBC SEEN NO ORGANISMS SEEN   Final   Culture   Final    NO GROWTH < 12 HOURS Performed at [facility], [place] [address], [place],  [address]    Report Status PENDING  Incomplete    Serology:  Imaging: reivewed  Assessment/plan:   Patient Active Problem List   Diagnosis Date Noted  . Neuropathic ulcer of foot, unspecified laterality, limited to breakdown of skin ([place]) 06/09/2020  . FUO (fever of unknown origin) 09/17/2019  . Cerebral thrombosis with cerebral infarction 08/02/2019  . Cerebral embolism with cerebral infarction 08/02/2019  . Acute respiratory failure due to COVID-19 ([place]) 07/30/2019  . Acute respiratory failure with hypoxia ([place]) 07/30/2019  . Chronic systolic HF (heart failure) ([place]) 02/25/2019  . ESRD (end stage renal disease) on dialysis ([place]) 02/25/2019  . Generalized abdominal pain 07/16/2018  . Arteriovenous fistula for hemodialysis in place, primary ([place]) 05/15/2018  . 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 Acute blood loss anemia 12/14/2017  . Hypomagnesemia 12/03/2017  . Anticoagulated 12/02/2017  . Stroke ([place]) 11/29/2017  . Acute antibody mediated rejection  of transplanted kidney 11/28/2017  . History of MI (myocardial infarction) 11/28/2017  . Hypertension 11/28/2017  . Chronic hepatitis C without hepatic coma ([place]) 09/25/2017  . Diabetes mellitus type 2, uncomplicated ([place]) 63/81/7711  . Myocardial infarction ([place]) 09/25/2017  . Renal transplant recipient 09/25/2017  . Transfusion history 09/25/2017  . History of coronary artery disease 09/12/2016  . 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 MI ([place]) 09/12/2016  . Aftercare following organ transplant 09/11/2016  . 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 At risk for infection transmitted from donor 06/16/2014  . Ureteral stent displacement ([place]) 06/10/2014  . Prophylactic antibiotic 06/09/2014  . Immunosuppression ([place]) 06/06/2014     Problem List Items Addressed This Visit    None    Visit Diagnoses    Diabetic ulcer of other part of right foot associated with diabetes mellitus due to underlying condition, limited to breakdown of skin ([place])    -  Primary     Extensive counseling on pathogenesis, natural history of chronic diabetic foot ulcer. Reviewed definition/clinical criteria for diabetic foot infection including osteomyelitis  Reassurance to patient at this time doesn't appear to have osteomyelitis or soft tissue infection. Recent bone cx on 11/11 was done a week off augmentin and was negative   You have a chronic ulcer of the foot. It doesn't appear infected at this time. Diabetic foot ulcer can last a while but will need aggressive wound care and off loading and good diabetic control  When it is infected (purulence discharge, swelling, redness, pain) will invariably be present along with fever/chill.  Imaging suggestion of bone infection will need to be confirmed with bone biopsy/culture, as 50% of the time the imaging suggestion is not true infection  Furthermore, when one culture the chronic ulcer, there can be many different bacteria, but  unless there is physical sign of infection, these are just bystanders and don't represent infection   Continue good wound care, diabetic control, off loading; these can be discussed with your wound care doctor and podiatrist   -no abx indicated -consider trending crp/esr if concern for osteomyelitis -continue aggressive wound care, off loading, and  diabetic control -f/u as needed if sign or concern for infection   Follow up as needed with [place] if you are concerned about infection I am having Dimas Alexandria Sr. maintain his allopurinol, aspirin, NovoLOG FlexPen, metoprolol succinate, Insulin Pen Needle, metoCLOPramide, senna, atorvastatin, lidocaine-prilocaine, losartan, feeding supplement (NEPRO CARB STEADY), glucagon (human recombinant), levalbuterol, warfarin, amoxicillin-clavulanate, methocarbamol, sertraline, sevelamer carbonate, b complex-vitamin c-folic acid, Tresiba FlexTouch, predniSONE, warfarin, and Acetaminophen.   No orders of the defined types were placed in this encounter.    Follow-up: Return if symptoms worsen or fail to improve.  Jabier Mutton, [facility] [place] -- -- pager   [phone] cell 06/18/2020, 11:08 AM

## 2020-06-18 NOTE — Patient Instructions (Signed)
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 It doesn't appear infected at this time. Diabetic foot ulcer can last a while but will need aggressive wound care and off loading and good diabetic control  When it is infected (purulence discharge, swelling, redness, pain) will invariably be present along with fever/chill.  Imaging suggestion of bone infection will need to be confirmed with bone biopsy/culture, as 50% of the time the imaging suggestion is not true infection  Furthermore, when one culture the chronic ulcer, there can be many different bacteria, but unless there is physical sign of infection, these are just bystanders and don't represent infection   Continue good wound care, diabetic control, off loading; these can be discussed with your wound care doctor and podiatrist  Follow up as needed with [place] if you are concerned about infection  Thank you

## 2020-06-22 LAB — AEROBIC/ANAEROBIC CULTURE W GRAM STAIN (SURGICAL/DEEP WOUND): Gram Stain: NONE SEEN

## 2020-06-23 ENCOUNTER — Other Ambulatory Visit: Payer: Self-pay

## 2020-06-23 ENCOUNTER — Encounter (HOSPITAL_BASED_OUTPATIENT_CLINIC_OR_DEPARTMENT_OTHER): Payer: Medicare Other | Admitting: Internal Medicine

## 2020-06-23 DIAGNOSIS — E11621 Type 2 diabetes mellitus with foot ulcer: Secondary | ICD-10-CM | POA: Diagnosis not present

## 2020-06-23 NOTE — Progress Notes (Signed)
Jonathan, [name] ([id number]) Visit Report for 06/23/2020 Debridement Details Patient Name: Date of Service: Jonathan [name], Jonathan [name] 06/23/2020 2:15 PM Medical Record Number: [id number] Patient Account Number: [id number] Date of Birth/Sex: Treating RN: October 24, 1950 (69 y.o. [name]) Carlene Coria Primary Care Provider: Marton Redwood Other Clinician: Referring Provider: Treating Provider/Extender: Estelle Grumbles in Treatment: 20 Debridement Performed for Assessment: Wound #1 Right,Lateral Foot Performed By: Physician Ricard Dillon., MD Debridement Type: Debridement Severity of Tissue Pre Debridement: Fat layer exposed Level of Consciousness (Pre-procedure): Awake and Alert Pre-procedure Verification/Time Out Yes - 15:33 Taken: Start Time: 15:33 Pain Control: Lidocaine 5% topical ointment T Area Debrided (L x W): otal 1.4 (cm) x 1.2 (cm) = 1.68 (cm) Tissue and other material debrided: Viable, Non-Viable, Callus, Slough, Subcutaneous, Skin: Dermis , Skin: Epidermis, Slough Level: Skin/Subcutaneous Tissue Debridement Description: Excisional Instrument: Curette Bleeding: Moderate Hemostasis Achieved: Silver Nitrate End Time: 15:36 Procedural Pain: 3 Post Procedural Pain: 1 Response to Treatment: Procedure was tolerated well Level of Consciousness (Post- Awake and Alert procedure): Post Debridement Measurements of Total Wound Length: (cm) 1.4 Width: (cm) 1.2 Depth: (cm) 0.6 Volume: (cm) 0.792 Character of Wound/Ulcer Post Debridement: Improved Severity of Tissue Post Debridement: Fat layer exposed Post Procedure Diagnosis Same as Pre-procedure Electronic Signature(s) Signed: 06/23/2020 4:54:15 PM By: Linton Ham MD Signed: 06/23/2020 5:00:30 PM By: Carlene Coria RN Entered By: Linton Ham on 06/23/2020 16:41:06 -------------------------------------------------------------------------------- HPI Details Patient Name: Date of Service: [name], Jonathan [name] 06/23/2020  2:15 PM Medical Record Number: [id number] Patient Account Number: [id number] Date of Birth/Sex: Treating RN: 25-Mar-1951 (69 y.o. [name] Primary Care Provider: Marton Redwood Other Clinician: Referring Provider: Treating Provider/Extender: Estelle Grumbles in Treatment: 20 History of Present Illness HPI Description: ADMISSION 01/30/2020 This is a 69 year old man who was hospitalized with Covid for a month in January 2021. Subsequently he was discharged to [facility] skilled facility for rehabilitation. His wife states that when he got home he had areas on the right lateral heel, right lateral fifth metatarsal head. They have been using Santyl to the right lateral heel no dressing on the right lateral foot. Our intake nurse discovered in small area on the right lateral great toe. He is minimally ambulatory at this point. He does not have a known history of PAD although he is a diabetic. 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 Still a small open area on the medial ffirth metatarsal head. But this is also better. He has not heard anything about his arterial studies that we ordered 7/13; patient's review with vein and vascular/noninvasive testing is not till 8/3. Still an open area on the right lateral first met head. The area on the right lateral heel remains closed although it is callused 7/27; arterial studies on 8 3. Still has the open area on the right fifth metatarsal head. We have  been using Hydrofera Blue 8/10-Comes a 2 weeks, had his vascular studies done, ABIs on both sides are reasonably good, TBI on the right is 145 Continuing with Hydrofera Blue, the wound is slightly  larger than last time. 8/31; months since I last saw this man. We have been using Hydrofera Blue to the small remaining area on the right fifth metatarsal head. He did have arterial studies done on 8/3 and while these were not terrible they certainly were normal. He is noncompressible on the right but with triphasic waveforms and a TBI of 0.96 on the left he is noncompressible with monophasic waveforms at the PTA triphasic at the dorsalis pedis with a TBI of 0.59 9/14 this patient still has a punched-out area on the right fifth metatarsal head lateral aspect. This is very painful. I looked back on his arterial studies his ABIs were noncompressible but with triphasic waveforms and a TBI of 0.96. We have been using Iodoflex 9/27; this patient has a punched-out wound on the right fifth metatarsal head lateral aspect. Very painful. He is a minimal ambulator and very difficult to pick up on claudication. I have once again reviewed his arterial studies which were really not all that impressive. He is noncompressible on the right but he had triphasic waveforms at the PTA and DP and a great toe pressure of 145 with a TBI of 0.96 we have been using Iodoflex without any improvement 10/5; x-ray from last week suggested osteomyelitis showing lateral demineralization in the fifth metatarsal head. He will need a noncontrasted MRI secondary to severe chronic renal failure last creatinine I see is 4.921 09/22/2019. We have been using Iodoflex 10/19; still using Iodoflex. Comes in today with a post debridement improvement in the wound bed. I removed skin and subcutaneous tissues some denuded tendon. The wound is small punched out with some undermining but generally a lot better surface than 2 weeks ago. I have him on  Augmentin due to a plain x-ray that suggested osteomyelitis. His noncontrasted MRI is on 4/26. The PCR culture that I did last time [not bone] group C strep as well as Peptostreptococcus. I have him on Augmentin 500/125 1 p.o. every 24 after dialysis on dialysis days 11/4; the patient is still using Iodoflex. His MRI shows osteomyelitis of the proximal phalanx of the right fifth toe as well as the head and neck of the fifth metatarsal. I have him on Augmentin not making much progress. I suspect he is going to require an ID consultation. He is on dialysis 11/9; I stopped his Augmentin last week. T oday I used rongeurs in order to get a small piece of bone for culture and pathology. We have been using Iodoflex to the wound surface 11/16; the specimen I bone I obtained last time was negative for acute osteomyelitis. Culture showed rare Staphylococcus Caprae presumably coag negative I am not sure of the significance of this. He had a previous MRI that suggested osteomyelitis of the proximal fifth toe and the fifth met head as well as the neck of the fifth metatarsal. He went to see infectious disease and was seen by Dr. Gale Journey. He did not feel that the patient had osteomyelitis or soft tissue infection he did not feel that this represented an infected wound at all. He did not feel antibiotics were indicated imaging suggested of a bone the infection will need to be confirmed with bone biopsy and culture which I did as noted they did not confirm osteomyelitis or a true pathogen. Nevertheless to all of this the patient still has a very. Painful periwound and a necrotic nonviable wound surface with exposed bone Electronic Signature(s) Signed: 06/23/2020 4:54:15 PM By: Linton Ham MD Entered By:  Linton Ham on 06/23/2020 16:43:59 -------------------------------------------------------------------------------- Physical Exam Details Patient Name: Date of Service: CRAIG, IONESCU 06/23/2020 2:15 PM Medical  Record Number: [id number] Patient Account Number: [id number] Date of Birth/Sex: Treating RN: 08/30/1950 (69 y.o. [name]) Carlene Coria Primary Care Provider: Marton Redwood Other Clinician: Referring Provider: Treating Provider/Extender: Estelle Grumbles in Treatment: 20 Constitutional Sitting or standing Blood Pressure is within target range for patient.. Pulse regular and within target range for patient.Marland Kitchen Respirations regular, non-labored and within target range.. Temperature is normal and within the target range for the patient.Marland Kitchen Appears in no distress. Cardiovascular Palpable dorsalis pedis pulse on the left on the right.. Notes Wound exam; right lateral fifth metatarsal head. I used a pickup #5 to try and remove some of the material and then pickups and a small scalpel. This is all necrotic there is no viable tissue. There is quite a bit of palpable tenderness around the wound. Although I did not palpate bone through the necrotic tissue I feel quite confident there is bone exposed here. Electronic Signature(s) Signed: 06/23/2020 4:54:15 PM By: Linton Ham MD Entered By: Linton Ham on 06/23/2020 16:45:08 -------------------------------------------------------------------------------- Physician Orders Details Patient Name: Date of Service: SABEN, DONIGAN [name] 06/23/2020 2:15 PM Medical Record Number: [id number] Patient Account Number: [id number] Date of Birth/Sex: Treating RN: 01-Apr-1951 (69 y.o. [name] Primary Care Provider: Marton Redwood Other Clinician: Referring Provider: Treating Provider/Extender: Estelle Grumbles in Treatment: 20 Verbal / Phone Orders: No Diagnosis Coding ICD-10 Coding Code Description E11.621 Type 2 diabetes mellitus with foot ulcer L97.512 Non-pressure chronic ulcer of other part of right foot with fat layer exposed E11.51 Type 2 diabetes mellitus with diabetic peripheral angiopathy without  gangrene Follow-up Appointments Return Appointment in 2 weeks. Dressing Change Frequency Change Dressing every other day. Skin Barriers/Peri-Wound Care Skin Prep Wound Cleansing May shower with protection. - use a cast protector on days not changing. May shower and wash wound with soap and water. - with dressings changes. Primary Wound Dressing Wound #1 Right,Lateral Foot Calcium Alginate with Silver Secondary Dressing Foam Border Off-Loading Open toe surgical shoe to: - right foot Other: - float right heel with pillows to offload pressure to heel while resting in chair and bed. no pressure to right foot. Additional Orders / Instructions Other: - Stop Augmentin right now for a bone culture to be performed on Tuesday. Consults Vascular - vascular consult , patient may see first available in reference to blood flow and healing, ABI's performed on 03/10/20 - (ICD10 E11.51 - Type 2 diabetes mellitus with diabetic peripheral angiopathy without gangrene) Electronic Signature(s) Signed: 06/23/2020 4:54:15 PM By: Linton Ham MD Signed: 06/23/2020 5:00:30 PM By: Carlene Coria RN Entered By: Carlene Coria on 06/23/2020 15:50:51 Prescription 06/23/2020 -------------------------------------------------------------------------------- Antonietta Barcelona MD Patient Name: Provider: 1950/10/22 [id number] Date of Birth: NPI#: [name] [id number] Sex: DEA #: [phone] [id number] Phone #: License #: [place] Patient Address: [address], [place] [address] [address], [place] [address] [phone] Allergies No Known Allergies Provider's Orders Vascular - ICD10: E11.51 - vascular consult , patient may see first available in reference to blood flow and healing, ABI's performed on 03/10/20 Hand Signature: Date(s): Electronic Signature(s) Signed: 06/23/2020 4:54:15 PM By: Linton Ham MD Signed: 06/23/2020  5:00:30 PM By: Carlene Coria RN Entered By: Carlene Coria on 06/23/2020 15:50:52 -------------------------------------------------------------------------------- Problem List Details Patient Name: Date of Service: CREW, GOREN [name] 06/23/2020 2:15 PM Medical Record  Number: [id number] Patient Account Number: [id number] Date of Birth/Sex: Treating RN: 20-Aug-1950 (69 y.o. [name]) Carlene Coria Primary Care Provider: Marton Redwood Other Clinician: Referring Provider: Treating Provider/Extender: Estelle Grumbles in Treatment: 20 Active Problems ICD-10 Encounter Code Description Active Date MDM Diagnosis E11.621 Type 2 diabetes mellitus with foot ulcer 01/30/2020 No Yes L97.512 Non-pressure chronic ulcer of other part of right foot with fat layer exposed 01/30/2020 No Yes E11.51 Type 2 diabetes mellitus with diabetic peripheral angiopathy without gangrene 01/30/2020 No Yes Inactive Problems ICD-10 Code Description Active Date Inactive Date L97.411 Non-pressure chronic ulcer of right heel and midfoot limited to breakdown of skin 01/30/2020 01/30/2020 Resolved Problems Electronic Signature(s) Signed: 06/23/2020 4:54:15 PM By: Linton Ham MD Entered By: Linton Ham on 06/23/2020 16:39:50 -------------------------------------------------------------------------------- Progress Note Details Patient Name: Date of Service: AMINE, ADELSON [name] 06/23/2020 2:15 PM Medical Record Number: [id number] Patient Account Number: [id number] Date of Birth/Sex: Treating RN: 03/03/1951 (69 y.o. [name] Primary Care Provider: Marton Redwood Other Clinician: Referring Provider: Treating Provider/Extender: Estelle Grumbles in Treatment: 20 Subjective History of Present Illness (HPI) ADMISSION 01/30/2020 This is a 69 year old man who was hospitalized with Covid for a month in January 2021. Subsequently he was discharged to [facility] skilled facility for rehabilitation.  His wife states that when he got home he had areas on the right lateral heel, right lateral fifth metatarsal head. They have been using Santyl to the right lateral heel no dressing on the right lateral foot. Our intake nurse discovered in small area on the right lateral great toe. He is minimally ambulatory at this point. He does not have a known history of PAD although he is a diabetic. 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 Still a small open area on the medial ffirth metatarsal head. But this is also better. He has not heard anything about his arterial studies that we ordered 7/13; patient's review with vein and vascular/noninvasive testing is not till 8/3. Still an open area on the right lateral first met head. The area on the right lateral heel remains closed although it is callused 7/27; arterial studies on 8 3. Still has the open area on the right fifth metatarsal head. We have been using Hydrofera Blue 8/10-Comes a 2 weeks, had his vascular studies done, ABIs on both sides are reasonably good, TBI on the right is 145 Continuing with Hydrofera Blue, the wound is slightly larger than last time. 8/31; months since I last saw this man. We have been using Hydrofera Blue to the small remaining area on the right fifth metatarsal head. He did have arterial studies done on 8/3 and while these were not terrible they certainly were normal. He is noncompressible on the right but with triphasic waveforms and a TBI  of 0.96 on the left he is noncompressible with monophasic waveforms at the PTA triphasic at the dorsalis pedis with a TBI of 0.59 9/14 this patient still has a punched-out area on the right fifth metatarsal head lateral aspect. This is very painful. I looked back  on his arterial studies his ABIs were noncompressible but with triphasic waveforms and a TBI of 0.96. We have been using Iodoflex 9/27; this patient has a punched-out wound on the right fifth metatarsal head lateral aspect. Very painful. He is a minimal ambulator and very difficult to pick up on claudication. I have once again reviewed his arterial studies which were really not all that impressive. He is noncompressible on the right but he had triphasic waveforms at the PTA and DP and a great toe pressure of 145 with a TBI of 0.96 we have been using Iodoflex without any improvement 10/5; x-ray from last week suggested osteomyelitis showing lateral demineralization in the fifth metatarsal head. He will need a noncontrasted MRI secondary to severe chronic renal failure last creatinine I see is 4.921 09/22/2019. We have been using Iodoflex 10/19; still using Iodoflex. Comes in today with a post debridement improvement in the wound bed. I removed skin and subcutaneous tissues some denuded tendon. The wound is small punched out with some undermining but generally a lot better surface than 2 weeks ago. I have him on Augmentin due to a plain x-ray that suggested osteomyelitis. His noncontrasted MRI is on 4/26. The PCR culture that I did last time [not bone] group C strep as well as Peptostreptococcus. I have him on Augmentin 500/125 1 p.o. every 24 after dialysis on dialysis days 11/4; the patient is still using Iodoflex. His MRI shows osteomyelitis of the proximal phalanx of the right fifth toe as well as the head and neck of the fifth metatarsal. I have him on Augmentin not making much progress. I suspect he is going to require an ID consultation.  He is on dialysis 11/9; I stopped his Augmentin last week. T oday I used rongeurs in order to get a small piece of bone for culture and pathology. We have been using Iodoflex to the wound surface 11/16; the specimen I bone I obtained last time was negative for acute osteomyelitis. Culture showed rare Staphylococcus Caprae presumably coag negative I am not sure of the significance of this. He had a previous MRI that suggested osteomyelitis of the proximal fifth toe and the fifth met head as well as the neck of the fifth metatarsal. He went to see infectious disease and was seen by Dr. Gale Journey. He did not feel that the patient had osteomyelitis or soft tissue infection he did not feel that this represented an infected wound at all. He did not feel antibiotics were indicated imaging suggested of a bone the infection will need to be confirmed with bone biopsy and culture which I did as noted they did not confirm osteomyelitis or a true pathogen. Nevertheless to all of this the patient still has a very. Painful periwound and a necrotic nonviable wound surface with exposed bone Objective Constitutional Sitting or standing Blood Pressure is within target range for patient.. Pulse regular and within target range for patient.Marland Kitchen Respirations regular, non-labored and within target range.. Temperature is normal and within the target range for the patient.Marland Kitchen Appears in no distress. Vitals Time Taken: 2:47 PM, Height: 71 in, Weight: 145 lbs, BMI: 20.2, Temperature: 98.4 F, Pulse: 90 bpm, Respiratory Rate: 18 breaths/min, Blood Pressure: 113/71 mmHg. Cardiovascular Palpable dorsalis pedis pulse on the left on the right.. General Notes: Wound exam; right lateral fifth metatarsal head. I used a pickup #5 to try and remove some of the material and then pickups and a small scalpel. This is all necrotic there is no viable tissue.  There is quite a bit of palpable tenderness around the wound. Although I did not palpate bone  through the necrotic tissue I feel quite confident there is bone exposed here. Integumentary (Hair, Skin) Wound #1 status is Open. Original cause of wound was Gradually Appeared. The wound is located on the Right,Lateral Foot. The wound measures 1.4cm length x 1.2cm width x 0.6cm depth; 1.319cm^2 area and 0.792cm^3 volume. There is bone and Fat Layer (Subcutaneous Tissue) exposed. There is no tunneling noted, however, there is undermining starting at 12:00 and ending at 12:00 with a maximum distance of 0.5cm. There is a medium amount of serosanguineous drainage noted. The wound margin is well defined and not attached to the wound base. There is no granulation within the wound bed. There is a large (67-100%) amount of necrotic tissue within the wound bed including Adherent Slough. Assessment Active Problems ICD-10 Type 2 diabetes mellitus with foot ulcer Non-pressure chronic ulcer of other part of right foot with fat layer exposed Type 2 diabetes mellitus with diabetic peripheral angiopathy without gangrene Procedures Wound #1 Pre-procedure diagnosis of Wound #1 is a Diabetic Wound/Ulcer of the Lower Extremity located on the Right,Lateral Foot .Severity of Tissue Pre Debridement is: Fat layer exposed. There was a Excisional Skin/Subcutaneous Tissue Debridement with a total area of 1.68 sq cm performed by Ricard Dillon., MD. With the following instrument(s): Curette to remove Viable and Non-Viable tissue/material. Material removed includes Callus, Subcutaneous Tissue, Slough, Skin: Dermis, and Skin: Epidermis after achieving pain control using Lidocaine 5% topical ointment. No specimens were taken. A time out was conducted at 15:33, prior to the start of the procedure. A Moderate amount of bleeding was controlled with Silver Nitrate. The procedure was tolerated well with a pain level of 3 throughout and a pain level of 1 following the procedure. Post Debridement Measurements: 1.4cm length x  1.2cm width x 0.6cm depth; 0.792cm^3 volume. Character of Wound/Ulcer Post Debridement is improved. Severity of Tissue Post Debridement is: Fat layer exposed. Post procedure Diagnosis Wound #1: Same as Pre-Procedure Plan Follow-up Appointments: Return Appointment in 2 weeks. Dressing Change Frequency: Change Dressing every other day. Skin Barriers/Peri-Wound Care: Skin Prep Wound Cleansing: May shower with protection. - use a cast protector on days not changing. May shower and wash wound with soap and water. - with dressings changes. Primary Wound Dressing: Wound #1 Right,Lateral Foot: Calcium Alginate with Silver Secondary Dressing: Foam Border Off-Loading: Open toe surgical shoe to: - right foot Other: - float right heel with pillows to offload pressure to heel while resting in chair and bed. no pressure to right foot. Additional Orders / Instructions: Other: - Stop Augmentin right now for a bone culture to be performed on Tuesday. Consults ordered were: Vascular - vascular consult , patient may see first available in reference to blood flow and healing, ABI's performed on 03/10/20 1. I am going to ask vascular surgery to look at this area although his formal arterial studies were not all that impressive they did have a noncompressible ABI but his TBI's and waveforms were normal. The question is does he have enough blood flow to heal this area and does he require an angiogram to determine this 2. In spite of Dr. Hart [place] reassuring note I am not at all certain that this is not a true infection either soft tissue and/or osteomyelitis. I am left with explaining a necrotic wound that is not healing with exposed bone. 3. I change the primary dressing to silver alginate 4. I'll  look at the vascular surgery consult in 2 weeks consider a PCR culture and consider getting IV antibiotics through dialysis myself based on this. 5. I am concerned that this area is not going to remain viable and if it  deteriorates he may end up with a rate amputation or worse. His wife brought this up spontaneously today and I could not reassure her that that is not a possibility. I outlined the plan above that I felt reasonable about. She asked me if she needed to take him to [facility] and I said that she was welcome to do so but I have complete confidence and vascular surgery and I really could not help her get in to do Electronic Signature(s) Signed: 06/23/2020 4:54:15 PM By: Linton Ham MD Entered By: Linton Ham on 06/23/2020 16:47:54 -------------------------------------------------------------------------------- SuperBill Details Patient Name: Date of Service: AYODELE, SANGALANG [name] 06/23/2020 Medical Record Number: [id number] Patient Account Number: [id number] Date of Birth/Sex: Treating RN: 06-26-1951 (69 y.o. 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.51 Type 2 diabetes mellitus with diabetic peripheral angiopathy without gangrene Facility Procedures CPT4 Code: [id number] Description: 10211 - DEB SUBQ TISSUE 20 SQ CM/< ICD-10 Diagnosis Description E11.621 Type 2 diabetes mellitus with foot ulcer L97.512 Non-pressure chronic ulcer of other part of right foot with fat layer exposed Modifier: Quantity: 1 Physician Procedures : CPT4 Code Description Modifier [id number] - WC PHYS SUBQ TISS 20 SQ CM ICD-10 Diagnosis Description E11.621 Type 2 diabetes mellitus with foot ulcer L97.512 Non-pressure chronic ulcer of other part of right foot with fat layer exposed Quantity: 1 Electronic Signature(s) Signed: 06/23/2020 4:54:15 PM By: Linton Ham MD Entered By: Linton Ham on  06/23/2020 16:49:37

## 2020-06-24 NOTE — Progress Notes (Signed)
ANANTH, FIALLOS ([id number]) Visit Report for 06/23/2020 Arrival Information Details Patient Name: Date of Service: Jonathan [name], Jonathan [name] 06/23/2020 2:15 PM Medical Record Number: [id number] Patient Account Number: [id number] Date of Birth/Sex: Treating RN: 1951/01/12 (69 y.o. [name]) Carlene Coria Primary Care Analysse Quinonez: Marton Redwood Other Clinician: Referring Ellisyn Icenhower: Treating Larine Fielding/Extender: Estelle Grumbles in Treatment: 2 Visit Information History Since Last Visit Added or deleted any medications: No Patient Arrived: Kasandra Knudsen Any new allergies or adverse reactions: No Arrival Time: 14:46 Had a fall or experienced change in No Accompanied By: self activities of daily living that may affect Transfer Assistance: None risk of falls: Patient Identification Verified: Yes Signs or symptoms of abuse/neglect since last visito No Secondary Verification Process Completed: Yes Hospitalized since last visit: No Patient Requires Transmission-Based Precautions: No Implantable device outside of the clinic excluding No Patient Has Alerts: Yes cellular tissue based products placed in the center Patient Alerts: Patient on Blood Thinner since last visit: Right ABI: non compress Has Dressing in Place as Prescribed: Yes Pain Present Now: No Electronic Signature(s) Signed: 06/24/2020 11:19:23 AM By: Sandre Kitty Entered By: Sandre Kitty on 06/23/2020 14:47:16 -------------------------------------------------------------------------------- Encounter Discharge Information Details Patient Name: Date of Service: Jonathan [name], Jonathan [name] 06/23/2020 2:15 PM Medical Record Number: [id number] Patient Account Number: [id number] Date of Birth/Sex: Treating RN: [birth year] (69 y.o. [name] Primary Care Micheline Markes: Marton Redwood Other Clinician: Referring Camreigh Michie: Treating Anikin Prosser/Extender: Estelle Grumbles in Treatment: 20 Encounter Discharge Information Items Post  Procedure Vitals Discharge Condition: Stable Temperature (F): 98.4 Ambulatory Status: Cane Pulse (bpm): 90 Discharge Destination: Home Respiratory Rate (breaths/min): 18 Transportation: Private Auto Blood Pressure (mmHg): 113/71 Accompanied By: wife Schedule Follow-up Appointment: Yes Clinical Summary of Care: Patient Declined Electronic Signature(s) Signed: 06/23/2020 4:23:26 PM By: Mikeal Hawthorne EMT/HBOT/SD Entered By: Mikeal Hawthorne on 06/23/2020 16:04:43 -------------------------------------------------------------------------------- Lower Extremity Assessment Details Patient Name: Date of Service: Jonathan [name], Jonathan [name] 06/23/2020 2:15 PM Medical Record Number: [id number] Patient Account Number: [id number] Date of Birth/Sex: Treating RN: 1951-04-06 (69 y.o. [name] Primary Care Kaleab Frasier: Marton Redwood Other Clinician: Referring Neetu Carrozza: Treating Derik Fults/Extender: Estelle Grumbles in Treatment: 20 Edema Assessment Assessed: Shirlyn Goltz: No] Patrice Paradise: No] Edema: [Left: N] [Right: o] Calf Left: Right: Point of Measurement: 41 cm From Medial Instep 30 cm Ankle Left: Right: Point of Measurement: 18 cm From Medial Instep 19 cm Vascular Assessment Pulses: Dorsalis Pedis Palpable: [Right:No] Electronic Signature(s) Signed: 06/23/2020 5:21:16 PM By: Baruch Gouty RN, BSN Entered By: Baruch Gouty on 06/23/2020 15:14:57 -------------------------------------------------------------------------------- Multi Wound Chart Details Patient Name: Date of Service: Jonathan [name], Jonathan [name] 06/23/2020 2:15 PM Medical Record Number: [id number] Patient Account Number: [id number] Date of Birth/Sex: Treating RN: [birth year] (69 y.o. [name]) Carlene Coria Primary Care Cashawn Yanko: Marton Redwood Other Clinician: Referring Truth Wolaver: Treating Burch Marchuk/Extender: Estelle Grumbles in Treatment: 20 Vital Signs Height(in): 71 Pulse(bpm): 60 Weight(lbs): 145 Blood  Pressure(mmHg): 113/71 Body Mass Index(BMI): 20 Temperature(F): 98.4 Respiratory Rate(breaths/min): 18 Photos: [1:No Photos Right, Lateral Foot] [N/A:N/A N/A] Wound Location: [1:Gradually Appeared] [N/A:N/A] Wounding Event: [1:Diabetic Wound/Ulcer of the Lower] [N/A:N/A] Primary Etiology: [1:Extremity Cataracts, Congestive Heart Failure,] [N/A:N/A] Comorbid History: [1:Hypertension, Myocardial Infarction, Hepatitis C, Type II Diabetes, End Stage Renal Disease, Dementia, Neuropathy 12/26/2019] [N/A:N/A] Date Acquired: [1:20] [N/A:N/A] Weeks of Treatment: [1:Open] [N/A:N/A] Wound Status: [1:1.4x1.2x0.6] [N/A:N/A] Measurements L x W x D (cm) [1:1.319] [N/A:N/A] A (cm) : rea [1:0.792] [N/A:N/A] Volume (cm) : [1:-740.10%] [N/A:N/A] % Reduction in A rea: [1:-1157.10%] [N/A:N/A] % Reduction in Volume: [1:12] Starting Position  1 (o'clock): [1:12] Ending Position 1 (o'clock): [1:0.5] Maximum Distance 1 (cm): [1:Yes] [N/A:N/A] Undermining: [1:Grade 2] [N/A:N/A] Classification: [1:Medium] [N/A:N/A] Exudate A mount: [1:Serosanguineous] [N/A:N/A] Exudate Type: [1:red, brown] [N/A:N/A] Exudate Color: [1:Well defined, not attached] [N/A:N/A] Wound Margin: [1:None Present (0%)] [N/A:N/A] Granulation A mount: [1:Large (67-100%)] [N/A:N/A] Necrotic A mount: [1:Fat Layer (Subcutaneous Tissue): Yes N/A] Exposed Structures: [1:Bone: Yes Fascia: No Tendon: No Muscle: No Joint: No None] [N/A:N/A] Epithelialization: [1:Debridement - Excisional] [N/A:N/A] Debridement: Pre-procedure Verification/Time Out 15:33 [N/A:N/A] Taken: [1:Lidocaine 5% topical ointment] [N/A:N/A] Pain Control: [1:Callus, Subcutaneous, Slough] [N/A:N/A] Tissue Debrided: [1:Skin/Subcutaneous Tissue] [N/A:N/A] Level: [1:1.68] [N/A:N/A] Debridement A (sq cm): [1:rea Curette] [N/A:N/A] Instrument: [1:Moderate] [N/A:N/A] Bleeding: [1:Silver Nitrate] [N/A:N/A] Hemostasis A chieved: [1:3] [N/A:N/A] Procedural Pain: [1:1]  [N/A:N/A] Post Procedural Pain: [1:Procedure was tolerated well] [N/A:N/A] Debridement Treatment Response: [1:1.4x1.2x0.6] [N/A:N/A] Post Debridement Measurements L x W x D (cm) [1:0.792] [N/A:N/A] Post Debridement Volume: (cm) [1:Debridement] [N/A:N/A] Treatment Notes Wound #1 (Right, Lateral Foot) 1. Cleanse With Wound Cleanser 2. Periwound Care Skin Prep 3. Primary Dressing Applied Calcium Alginate Ag 4. Secondary Dressing Foam Border Dressing Electronic Signature(s) Signed: 06/23/2020 4:54:15 PM By: Linton Ham MD Signed: 06/23/2020 5:00:30 PM By: Carlene Coria RN Entered By: Linton Ham on 06/23/2020 16:40:32 -------------------------------------------------------------------------------- Multi-Disciplinary Care Plan Details Patient Name: Date of Service: Jonathan [name], Jonathan [name] 06/23/2020 2:15 PM Medical Record Number: [id number] Patient Account Number: [id number] Date of Birth/Sex: Treating RN: 07-13-1951 (69 y.o. [name] Primary Care Jaxton Casale: Marton Redwood Other Clinician: Referring Seville Downs: Treating Gregrey Bloyd/Extender: Estelle Grumbles in Treatment: 20 Active Inactive Wound/Skin Impairment Nursing Diagnoses: Knowledge deficit related to ulceration/compromised skin integrity Goals: Patient/caregiver will verbalize understanding of skin care regimen Date Initiated: 01/30/2020 Target Resolution Date: 07/30/2020 Goal Status: Active Ulcer/skin breakdown will have a volume reduction of 50% by week 8 Date Initiated: 01/30/2020 Date Inactivated: 02/18/2020 Target Resolution Date: 03/31/2020 Goal Status: Unmet Unmet Reason: comorbities Ulcer/skin breakdown will have a volume reduction of 80% by week 12 Date Initiated: 02/18/2020 Date Inactivated: 04/07/2020 Target Resolution Date: 03/20/2020 Goal Status: Unmet Unmet Reason: comorbities Ulcer/skin breakdown will heal within 14 weeks Date Initiated: 04/07/2020 Date Inactivated:  04/21/2020 Target Resolution Date: 05/07/2020 Goal Status: Unmet Unmet Reason: comorbities Interventions: Assess patient/caregiver ability to obtain necessary supplies Assess patient/caregiver ability to perform ulcer/skin care regimen upon admission and as needed Provide education on ulcer and skin care Treatment Activities: Skin care regimen initiated : 01/30/2020 Topical wound management initiated : 01/30/2020 Notes: Electronic Signature(s) Signed: 06/23/2020 5:00:30 PM By: Carlene Coria RN Entered By: Carlene Coria on 06/23/2020 14:56:12 -------------------------------------------------------------------------------- Pain Assessment Details Patient Name: Date of Service: Jonathan [name], Jonathan [name] 06/23/2020 2:15 PM Medical Record Number: [id number] Patient Account Number: [id number] Date of Birth/Sex: Treating RN: [birth year] (69 y.o. [name] Primary Care Tyre Beaver: Marton Redwood Other Clinician: Referring Amarie Viles: Treating Levie Wages/Extender: Estelle Grumbles in Treatment: 20 Active Problems Location of Pain Severity and Description of Pain Patient Has Paino No Site Locations Pain Management and Medication Current Pain Management: Electronic Signature(s) Signed: 06/23/2020 5:00:30 PM By: Carlene Coria RN Signed: 06/24/2020 11:19:23 AM By: Sandre Kitty Entered By: Sandre Kitty on 06/23/2020 14:48:00 -------------------------------------------------------------------------------- Patient/Caregiver Education Details Patient Name: Date of Service: Jonathan [name], Jonathan [name] 11/16/2021andnbsp2:15 PM Medical Record Number: [id number] Patient Account Number: [id number] Date of Birth/Gender: Treating RN: 30-Oct-1950 (69 y.o. [name] Primary Care Physician: Marton Redwood Other Clinician: Referring Physician: Treating Physician/Extender: Estelle Grumbles in Treatment: 20 Education Assessment Education Provided To: Patient Education Topics  Provided Wound/Skin Impairment: Methods: Explain/Verbal Responses: State content correctly Electronic Signature(s) Signed: 06/23/2020 5:00:30 PM By: Carlene Coria RN Entered By: Carlene Coria on 06/23/2020 14:56:32 -------------------------------------------------------------------------------- Wound Assessment Details Patient Name: Date of Service: Jonathan [name], Jonathan [name] 06/23/2020 2:15 PM Medical Record Number: [id number] Patient Account Number: [id number] Date of Birth/Sex: Treating RN: 02/07/1951 (69 y.o. [name]) Carlene Coria Primary Care Makayah Pauli: Marton Redwood Other Clinician: Referring Annajulia Lewing: Treating Darothy Courtright/Extender: Estelle Grumbles in Treatment: 20 Wound Status Wound Number: 1 Primary Diabetic Wound/Ulcer of the Lower Extremity Etiology: Wound Location: Right, Lateral Foot Wound Open Wounding Event: Gradually Appeared Status: Date Acquired: 12/26/2019 Comorbid Cataracts, Congestive Heart Failure, Hypertension, Myocardial Weeks Of Treatment: 20 History: Infarction, Hepatitis C, Type II Diabetes, End Stage Renal Disease, Clustered Wound: No Dementia, Neuropathy Wound Measurements Length: (cm) 1.4 Width: (cm) 1.2 Depth: (cm) 0.6 Area: (cm) 1.319 Volume: (cm) 0.792 % Reduction in Area: -740.1% % Reduction in Volume: -1157.1% Epithelialization: None Tunneling: No Undermining: Yes Starting Position (o'clock): 12 Ending Position (o'clock): 12 Maximum Distance: (cm) 0.5 Wound Description Classification: Grade 2 Wound Margin: Well defined, not attached Exudate Amount: Medium Exudate Type: Serosanguineous Exudate Color: red, brown Foul Odor After Cleansing: No Slough/Fibrino Yes Wound Bed Granulation Amount: None Present (0%) Exposed Structure Necrotic Amount: Large (67-100%) Fascia Exposed: No Necrotic Quality: Adherent Slough Fat Layer (Subcutaneous Tissue) Exposed: Yes Tendon Exposed: No Muscle Exposed: No Joint Exposed: No Bone Exposed:  Yes Treatment Notes Wound #1 (Right, Lateral Foot) 1. Cleanse With Wound Cleanser 2. Periwound Care Skin Prep 3. Primary Dressing Applied Calcium Alginate Ag 4. Secondary Dressing Foam Border Dressing Electronic Signature(s) Signed: 06/23/2020 5:00:30 PM By: Carlene Coria RN Signed: 06/23/2020 5:21:16 PM By: Baruch Gouty RN, BSN Entered By: Baruch Gouty on 06/23/2020 15:15:56 -------------------------------------------------------------------------------- [place] Details Patient Name: Date of Service: Jonathan [name], Jonathan [name] 06/23/2020 2:15 PM Medical Record Number: [id number] Patient Account Number: [id number] Date of Birth/Sex: Treating RN: 10/06/1950 (69 y.o. [name]) Carlene Coria Primary Care Errik Mitchelle: Marton Redwood Other Clinician: Referring Khayri Kargbo: Treating Lavanda Nevels/Extender: Estelle Grumbles in Treatment: 20 Vital Signs Time Taken: 14:47 Temperature (F): 98.4 Height (in): 71 Pulse (bpm): 90 Weight (lbs): 145 Respiratory Rate (breaths/min): 18 Body Mass Index (BMI): 20.2 Blood Pressure (mmHg): 113/71 Reference Range: 80 - 120 mg / dl Electronic Signature(s) Signed: 06/24/2020 11:19:23 AM By: Sandre Kitty Entered By: Sandre Kitty on 06/23/2020 14:47:51

## 2020-07-07 ENCOUNTER — Encounter (HOSPITAL_BASED_OUTPATIENT_CLINIC_OR_DEPARTMENT_OTHER): Payer: Medicare Other | Admitting: Internal Medicine

## 2020-07-07 ENCOUNTER — Other Ambulatory Visit: Payer: Self-pay

## 2020-07-07 DIAGNOSIS — E11621 Type 2 diabetes mellitus with foot ulcer: Secondary | ICD-10-CM | POA: Diagnosis not present

## 2020-07-07 NOTE — Progress Notes (Signed)
Jonathan [name], Jonathan [name] ([id number]) Visit Report for 07/07/2020 Arrival Information Details Patient Name: Date of Service: Jonathan [name], Jonathan [name] 07/07/2020 1:30 PM Medical Record Number: [id number] Patient Account Number: [id number] Date of Birth/Sex: Treating RN: June 11, 1951 (69 y.o. Burnadette Pop, Lauren Primary Care Sara Keys: Marton Redwood Other Clinician: Referring Kory Rains: Treating Lekia Nier/Extender: Estelle Grumbles in Treatment: 39 Visit Information History Since Last Visit Added or deleted any medications: No Patient Arrived: Kasandra Knudsen Any new allergies or adverse reactions: No Arrival Time: 13:57 Had a fall or experienced change in No Accompanied By: wife activities of daily living that may affect Transfer Assistance: None risk of falls: Patient Identification Verified: Yes Signs or symptoms of abuse/neglect since last visito No Secondary Verification Process Completed: Yes Hospitalized since last visit: No Patient Requires Transmission-Based Precautions: No Implantable device outside of the clinic excluding No Patient Has Alerts: Yes cellular tissue based products placed in the center Patient Alerts: Patient on Blood Thinner since last visit: Right ABI: non compress Has Dressing in Place as Prescribed: Yes Pain Present Now: No Electronic Signature(s) Signed: 07/07/2020 5:52:12 PM By: Rhae Hammock RN Entered By: Rhae Hammock on 07/07/2020 13:57:36 -------------------------------------------------------------------------------- Lower Extremity Assessment Details Patient Name: Date of Service: Jonathan [name], Jonathan [name] 07/07/2020 1:30 PM Medical Record Number: [id number] Patient Account Number: [id number] Date of Birth/Sex: Treating RN: 1950-09-12 (69 y.o. Burnadette Pop, Lauren Primary Care Aleksis Jiggetts: Marton Redwood Other Clinician: Referring Kathe Wirick: Treating Aryannah Mohon/Extender: Estelle Grumbles in Treatment: 22 Edema Assessment Assessed: Shirlyn Goltz: No]  Patrice Paradise: Yes] Edema: [Left: Ye] [Right: s] Calf Left: Right: Point of Measurement: 41 cm From Medial Instep 31.5 cm Ankle Left: Right: Point of Measurement: 18 cm From Medial Instep 21 cm Vascular Assessment Pulses: Dorsalis Pedis Palpable: [Right:Yes] Posterior Tibial Palpable: [Right:Yes] Electronic Signature(s) Signed: 07/07/2020 5:52:12 PM By: Rhae Hammock RN Entered By: Rhae Hammock on 07/07/2020 14:01:58 -------------------------------------------------------------------------------- Multi Wound Chart Details Patient Name: Date of Service: Jonathan [name], Jonathan [name] 07/07/2020 1:30 PM Medical Record Number: [id number] Patient Account Number: [id number] Date of Birth/Sex: Treating RN: May 29, 1951 (69 y.o. [name]) Carlene Coria Primary Care Abigial Newville: Marton Redwood Other Clinician: Referring Raiyan Dalesandro: Treating Adreonna Yontz/Extender: Estelle Grumbles in Treatment: 22 Vital Signs Height(in): 71 Pulse(bpm): 98 Weight(lbs): 145 Blood Pressure(mmHg): 116/78 Body Mass Index(BMI): 20 Temperature(F): 97.7 Respiratory Rate(breaths/min): 18 Photos: [1:No Photos Right, Lateral Foot] [N/A:N/A N/A] Wound Location: [1:Gradually Appeared] [N/A:N/A] Wounding Event: [1:Diabetic Wound/Ulcer of the Lower] [N/A:N/A] Primary Etiology: [1:Extremity Cataracts, Congestive Heart Failure, N/A] Comorbid History: [1:Hypertension, Myocardial Infarction, Hepatitis C, Type II Diabetes, End Stage Renal Disease, Dementia, Neuropathy 12/26/2019] [N/A:N/A] Date Acquired: [1:22] [N/A:N/A] Weeks of Treatment: [1:Open] [N/A:N/A] Wound Status: [1:1x1x0.6] [N/A:N/A] Measurements L x W x D (cm) [1:0.785] [N/A:N/A] A (cm) : rea [1:0.471] [N/A:N/A] Volume (cm) : [1:-400.00%] [N/A:N/A] % Reduction in A [1:rea: -647.60%] [N/A:N/A] % Reduction in Volume: [1:Grade 2] [N/A:N/A] Classification: [1:Medium] [N/A:N/A] Exudate A mount: [1:Serosanguineous] [N/A:N/A] Exudate Type: [1:red, brown]  [N/A:N/A] Exudate Color: [1:Well defined, not attached] [N/A:N/A] Wound Margin: [1:None Present (0%)] [N/A:N/A] Granulation A mount: [1:Large (67-100%)] [N/A:N/A] Necrotic A mount: [1:Fat Layer (Subcutaneous Tissue): Yes N/A] Exposed Structures: [1:Bone: Yes Fascia: No Tendon: No Muscle: No Joint: No None] [N/A:N/A] Epithelialization: [1:Debridement - Excisional] [N/A:N/A] Debridement: Pre-procedure Verification/Time Out 14:36 [N/A:N/A] Taken: [1:Lidocaine 5% topical ointment] [N/A:N/A] Pain Control: [1:Bone, Subcutaneous, Slough] [N/A:N/A] Tissue Debrided: [1:Skin/Subcutaneous] [N/A:N/A] Level: [1:Tissue/Muscle/Bone 0.25] [N/A:N/A] Debridement A (sq cm): [1:rea Curette] [N/A:N/A] Instrument: [1:Moderate] [N/A:N/A] Bleeding: [1:Pressure] [N/A:N/A] Hemostasis Achieved: [1:0] [N/A:N/A] Procedural Pain: [1:0] [N/A:N/A] Post Procedural Pain: [1:Procedure was tolerated  well] [N/A:N/A] Debridement Treatment Response: Post Debridement Measurements L x 1x1x0.6 [N/A:N/A] W x D (cm) [1:0.471] [N/A:N/A] Post Debridement Volume: (cm) [1:Debridement] [N/A:N/A] Treatment Notes Electronic Signature(s) Signed: 07/07/2020 5:54:56 PM By: Linton Ham MD Signed: 07/07/2020 5:56:00 PM By: Carlene Coria RN Entered By: Linton Ham on 07/07/2020 15:09:52 -------------------------------------------------------------------------------- Multi-Disciplinary Care Plan Details Patient Name: Date of Service: Jonathan [name], Jonathan [name] 07/07/2020 1:30 PM Medical Record Number: [id number] Patient Account Number: [id number] Date of Birth/Sex: Treating RN: Sep 13, 1950 (69 y.o. [name] Primary Care Stacia Feazell: Marton Redwood Other Clinician: Referring Coulson Wehner: Treating Alper Guilmette/Extender: Estelle Grumbles in Treatment: 22 Active Inactive Wound/Skin Impairment Nursing Diagnoses: Knowledge deficit related to ulceration/compromised skin integrity Goals: Patient/caregiver will verbalize  understanding of skin care regimen Date Initiated: 01/30/2020 Target Resolution Date: 07/30/2020 Goal Status: Active Ulcer/skin breakdown will have a volume reduction of 50% by week 8 Date Initiated: 01/30/2020 Date Inactivated: 02/18/2020 Target Resolution Date: 03/31/2020 Goal Status: Unmet Unmet Reason: comorbities Ulcer/skin breakdown will have a volume reduction of 80% by week 12 Date Initiated: 02/18/2020 Date Inactivated: 04/07/2020 Target Resolution Date: 03/20/2020 Goal Status: Unmet Unmet Reason: comorbities Ulcer/skin breakdown will heal within 14 weeks Date Initiated: 04/07/2020 Date Inactivated: 04/21/2020 Target Resolution Date: 05/07/2020 Goal Status: Unmet Unmet Reason: comorbities Interventions: Assess patient/caregiver ability to obtain necessary supplies Assess patient/caregiver ability to perform ulcer/skin care regimen upon admission and as needed Provide education on ulcer and skin care Treatment Activities: Skin care regimen initiated : 01/30/2020 Topical wound management initiated : 01/30/2020 Notes: Electronic Signature(s) Signed: 07/07/2020 5:56:00 PM By: Carlene Coria RN Entered By: Carlene Coria on 07/07/2020 14:31:44 -------------------------------------------------------------------------------- Pain Assessment Details Patient Name: Date of Service: Jonathan [name], Jonathan [name] 07/07/2020 1:30 PM Medical Record Number: [id number] Patient Account Number: [id number] Date of Birth/Sex: Treating RN: [birth year] (69 y.o. Burnadette Pop, Lauren Primary Care Mande Auvil: Marton Redwood Other Clinician: Referring Kahlen Morais: Treating Ama Mcmaster/Extender: Estelle Grumbles in Treatment: 22 Active Problems Location of Pain Severity and Description of Pain Patient Has Paino No Site Locations Rate the pain. Current Pain Level: 0 Pain Management and Medication Current Pain Management: Electronic Signature(s) Signed: 07/07/2020 5:52:12 PM By: Rhae Hammock  RN Entered By: Rhae Hammock on 07/07/2020 14:07:32 -------------------------------------------------------------------------------- Patient/Caregiver Education Details Patient Name: Date of Service: Jonathan [name], Jonathan [name] 11/30/2021andnbsp1:30 PM Medical Record Number: [id number] Patient Account Number: [id number] Date of Birth/Gender: Treating RN: July 17, 1951 (69 y.o. [name]) Carlene Coria Primary Care Physician: Marton Redwood Other Clinician: Referring Physician: Treating Physician/Extender: Estelle Grumbles in Treatment: 22 Education Assessment Education Provided To: Patient Education Topics Provided Wound/Skin Impairment: Methods: Explain/Verbal Responses: State content correctly Electronic Signature(s) Signed: 07/07/2020 5:56:00 PM By: Carlene Coria RN Entered By: Carlene Coria on 07/07/2020 14:32:08 -------------------------------------------------------------------------------- Wound Assessment Details Patient Name: Date of Service: Jonathan [name], Jonathan [name] 07/07/2020 1:30 PM Medical Record Number: [id number] Patient Account Number: [id number] Date of Birth/Sex: Treating RN: 1950/09/18 (69 y.o. Burnadette Pop, Lauren Primary Care Broedy Osbourne: Marton Redwood Other Clinician: Referring Fryda Molenda: Treating Jamario Colina/Extender: Estelle Grumbles in Treatment: 22 Wound Status Wound Number: 1 Primary Diabetic Wound/Ulcer of the Lower Extremity Etiology: Wound Location: Right, Lateral Foot Wound Open Wounding Event: Gradually Appeared Status: Date Acquired: 12/26/2019 Comorbid Cataracts, Congestive Heart Failure, Hypertension, Myocardial Weeks Of Treatment: 22 History: Infarction, Hepatitis C, Type II Diabetes, End Stage Renal Disease, Clustered Wound: No Dementia, Neuropathy Wound Measurements Length: (cm) 1 Width: (cm) 1 Depth: (cm) 0.6 Area: (cm) 0.785 Volume: (cm) 0.471 % Reduction in Area: -400% % Reduction in Volume: -647.6% Epithelialization:  None Tunneling: No Undermining: No Wound Description Classification: Grade 2 Wound Margin: Well defined, not attached Exudate Amount: Medium Exudate Type: Serosanguineous Exudate Color: red, brown Foul Odor After Cleansing: No Slough/Fibrino Yes Wound Bed Granulation Amount: None Present (0%) Exposed Structure Necrotic Amount: Large (67-100%) Fascia Exposed: No Necrotic Quality: Adherent Slough Fat Layer (Subcutaneous Tissue) Exposed: Yes Tendon Exposed: No Muscle Exposed: No Joint Exposed: No Bone Exposed: Yes Electronic Signature(s) Signed: 07/07/2020 5:52:12 PM By: Rhae Hammock RN Entered By: Rhae Hammock on 07/07/2020 14:07:20 -------------------------------------------------------------------------------- Vitals Details Patient Name: Date of Service: Jonathan [name], Jonathan [name] 07/07/2020 1:30 PM Medical Record Number: [id number] Patient Account Number: [id number] Date of Birth/Sex: Treating RN: 09-22-1950 (69 y.o. Burnadette Pop, Lauren Primary Care Ilsa Bonello: Marton Redwood Other Clinician: Referring Kazi Montoro: Treating Haunani Dickard/Extender: Estelle Grumbles in Treatment: 22 Vital Signs Time Taken: 14:07 Temperature (F): 97.7 Height (in): 71 Pulse (bpm): 86 Weight (lbs): 145 Respiratory Rate (breaths/min): 18 Body Mass Index (BMI): 20.2 Blood Pressure (mmHg): 116/78 Reference Range: 80 - 120 mg / dl Notes pt. didn't check sugar Electronic Signature(s) Signed: 07/07/2020 5:52:12 PM By: Rhae Hammock RN Entered By: Rhae Hammock on 07/07/2020 14:08:21

## 2020-07-07 NOTE — Progress Notes (Signed)
DIVONTE, SENGER ([id number]) Visit Report for 07/07/2020 Debridement Details Patient Name: Date of Service: Jonathan [name], Jonathan [name] 07/07/2020 1:30 PM Medical Record Number: [id number] Patient Account Number: [id number] Date of Birth/Sex: Treating RN: 1951/04/15 (69 y.o. Jonathan [name]) Jonathan [name] Primary Care Provider: Marton [name] Other Clinician: Referring Provider: Treating Provider/Extender: Jonathan [name] in Treatment: 22 Debridement Performed for Assessment: Wound #1 Right,Lateral Foot Performed By: Physician Jonathan [name]., MD Debridement Type: Debridement Severity of Tissue Pre Debridement: Fat layer exposed Level of Consciousness (Pre-procedure): Awake and Alert Pre-procedure Verification/Time Out Yes - 14:36 Taken: Start Time: 14:36 Pain Control: Lidocaine 5% topical ointment T Area Debrided (L x W): otal 0.5 (cm) x 0.5 (cm) = 0.25 (cm) Tissue and other material debrided: Viable, Non-Viable, Bone, Slough, Subcutaneous, Skin: Dermis , Skin: Epidermis, Slough Level: Skin/Subcutaneous Tissue/Muscle/Bone Debridement Description: Excisional Instrument: Curette Specimen: Tissue Culture Number of Specimens T aken: 1 Bleeding: Moderate Hemostasis Achieved: Pressure End Time: 14:43 Procedural Pain: 0 Post Procedural Pain: 0 Response to Treatment: Procedure was tolerated well Level of Consciousness (Post- Awake and Alert procedure): Post Debridement Measurements of Total Wound Length: (cm) 1 Width: (cm) 1 Depth: (cm) 0.6 Volume: (cm) 0.471 Character of Wound/Ulcer Post Debridement: Improved Severity of Tissue Post Debridement: Fat layer exposed Post Procedure Diagnosis Same as Pre-procedure Electronic Signature(s) Signed: 07/07/2020 5:54:56 PM By: Jonathan Ham MD Signed: 07/07/2020 5:56:00 PM By: Jonathan Coria RN Entered By: Jonathan [name] on 07/07/2020 15:11:34 -------------------------------------------------------------------------------- HPI  Details Patient Name: Date of Service: Jonathan [name], Jonathan [name] 07/07/2020 1:30 PM Medical Record Number: [id number] Patient Account Number: [id number] Date of Birth/Sex: Treating RN: Jan 03, 1951 (69 y.o. Jonathan [name] Primary Care Provider: Marton [name] Other Clinician: Referring Provider: Treating Provider/Extender: Jonathan [name] in Treatment: 29 History of Present Illness HPI Description: ADMISSION 01/30/2020 This is a 69 year old man who was hospitalized with Covid for a month in January 2021. Subsequently he was discharged to [facility] skilled facility for rehabilitation. His wife states that when he got home he had areas on the right lateral heel, right lateral fifth metatarsal head. They have been using Santyl to the right lateral heel no dressing on the right lateral foot. Our intake nurse discovered in small area on the right lateral great toe. He is minimally ambulatory at this point. He does not have a known history of PAD although he is a diabetic. 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 Still a small open area on the medial ffirth metatarsal head. But this is also better. He has not heard anything about his arterial studies that we ordered 7/13; patient's review with vein and vascular/noninvasive testing is not till 8/3. Still an open area on the right lateral first met head. The area on the right lateral heel remains closed although it is callused 7/27; arterial studies on 8 3.  Still has the open area on the right fifth metatarsal head. We have been using Hydrofera Blue 8/10-Comes a 2 weeks, had his vascular studies done, ABIs on both sides are reasonably good, TBI on the right is 145  Continuing with Hydrofera Blue, the wound is slightly larger than last time. 8/31; months since I last saw this man. We have been using Hydrofera Blue to the small remaining area on the right fifth metatarsal head. He did have arterial studies done on 8/3 and while these were not terrible they certainly were normal. He is noncompressible on the right but with triphasic waveforms and a TBI of 0.96 on the left he is noncompressible with monophasic waveforms at the PTA triphasic at the dorsalis pedis with a TBI of 0.59 9/14 this patient still has a punched-out area on the right fifth metatarsal head lateral aspect. This is very painful. I looked back on his arterial studies his ABIs were noncompressible but with triphasic waveforms and a TBI of 0.96. We have been using Iodoflex 9/27; this patient has a punched-out wound on the right fifth metatarsal head lateral aspect. Very painful. He is a minimal ambulator and very difficult to pick up on claudication. I have once again reviewed his arterial studies which were really not all that impressive. He is noncompressible on the right but he had triphasic waveforms at the PTA and DP and a great toe pressure of 145 with a TBI of 0.96 we have been using Iodoflex without any improvement 10/5; x-ray from last week suggested osteomyelitis showing lateral demineralization in the fifth metatarsal head. He will need a noncontrasted MRI secondary to severe chronic renal failure last creatinine I see is 4.921 09/22/2019. We have been using Iodoflex 10/19; still using Iodoflex. Comes in today with a post debridement improvement in the wound bed. I removed skin and subcutaneous tissues some denuded tendon. The wound is small punched out with some undermining but  generally a lot better surface than 2 weeks ago. I have him on Augmentin due to a plain x-ray that suggested osteomyelitis. His noncontrasted MRI is on 4/26. The PCR culture that I did last time [not bone] group C strep as well as Peptostreptococcus. I have him on Augmentin 500/125 1 p.o. every 24 after dialysis on dialysis days 11/4; the patient is still using Iodoflex. His MRI shows osteomyelitis of the proximal phalanx of the right fifth toe as well as the head and neck of the fifth metatarsal. I have him on Augmentin not making much progress. I suspect he is going to require an ID consultation. He is on dialysis 11/9; I stopped his Augmentin last week. T oday I used rongeurs in order to get a small piece of bone for culture and pathology. We have been using Iodoflex to the wound surface 11/16; the specimen I bone I obtained last time was negative for acute osteomyelitis. Culture showed rare Staphylococcus Caprae presumably coag negative I am not sure of the significance of this. He had a previous MRI that suggested osteomyelitis of the proximal fifth toe and the fifth met head as well as the neck of the fifth metatarsal. He went to see infectious disease and was seen by Dr. Gale Journey. He did not feel that the patient had osteomyelitis or soft tissue infection he did not feel that this represented an infected wound at all. He did not feel antibiotics were indicated imaging suggested of a bone the infection will need to be confirmed with bone biopsy and culture which I did as noted they did not confirm osteomyelitis or a true pathogen. Nevertheless to all of this the patient still has a very. Painful periwound and a necrotic nonviable wound surface with exposed bone 11/30; absolutely no improvement  here. This is a deeper undermining wound almost all exposed bone. I have never seen any viable tissue here. We have been using silver alginate. He has vascular evaluation on 12/14. Fortunately he is not  complaining of a lot of pain I have repeated the PCR culture today. I did 1 in October and treated him with a course of Augmentin this did not help Electronic Signature(s) Signed: 07/07/2020 5:54:56 PM By: Jonathan Ham MD Entered By: Jonathan [name] on 07/07/2020 15:13:01 -------------------------------------------------------------------------------- Physical Exam Details Patient Name: Date of Service: Jonathan [name], Jonathan [name] 07/07/2020 1:30 PM Medical Record Number: [id number] Patient Account Number: [id number] Date of Birth/Sex: Treating RN: May 12, 1951 (69 y.o. Jonathan [name] Primary Care Provider: Marton [name] Other Clinician: Referring Provider: Treating Provider/Extender: Jonathan [name] in Treatment: 22 Constitutional Sitting or standing Blood Pressure is within target range for patient.. Pulse regular and within target range for patient.Marland Kitchen Respirations regular, non-labored and within target range.. Temperature is normal and within the target range for the patient.Marland Kitchen Appears in no distress. Cardiovascular Pedal pulses faintly palpable at the dorsalis pedis not at the posterior tibia. I cannot feel his popliteal pulse. Notes Wound exam; right lateral fifth metatarsal head necrotic material over the surface nothing but bone underneath significant undermining. I used a #5 curette to do a bone scraping specimen obtained vigorously for PCR once again. There is still palpable tenderness but no obvious infection in the soft tissues. Electronic Signature(s) Signed: 07/07/2020 5:54:56 PM By: Jonathan Ham MD Entered By: Jonathan [name] on 07/07/2020 15:14:28 -------------------------------------------------------------------------------- Physician Orders Details Patient Name: Date of Service: Jonathan [name], Jonathan [name] 07/07/2020 1:30 PM Medical Record Number: [id number] Patient Account Number: [id number] Date of Birth/Sex: Treating RN: 02-13-1951 (69 y.o. Jonathan [name]) Jonathan [name] Primary  Care Provider: Marton [name] Other Clinician: Referring Provider: Treating Provider/Extender: Jonathan [name] in Treatment: 41 Verbal / Phone Orders: No Diagnosis Coding ICD-10 Coding Code Description E11.621 Type 2 diabetes mellitus with foot ulcer L97.512 Non-pressure chronic ulcer of other part of right foot with fat layer exposed E11.51 Type 2 diabetes mellitus with diabetic peripheral angiopathy without gangrene Follow-up Appointments Return Appointment in 2 weeks. Dressing Change Frequency Change Dressing every other day. Skin Barriers/Peri-Wound Care Skin Prep Wound Cleansing May shower with protection. - use a cast protector on days not changing. May shower and wash wound with soap and water. - with dressings changes. Primary Wound Dressing Wound #1 Right,Lateral Foot Calcium Alginate with Silver Secondary Dressing Foam Border Off-Loading Open toe surgical shoe to: - right foot Other: - float right heel with pillows to offload pressure to heel while resting in chair and bed. no pressure to right foot. Laboratory naerobe culture (MICRO) - PCR right foot , non healing wopund - (ICD10 E11.621 - Type 2 Bacteria identified in Unspecified specimen by A diabetes mellitus with foot ulcer) LOINC Code: 789-3 Convenience Name: Anerobic culture Electronic Signature(s) Signed: 07/07/2020 5:54:56 PM By: Jonathan Ham MD Signed: 07/07/2020 5:56:00 PM By: Jonathan Coria RN Entered By: Jonathan [name] on 07/07/2020 14:49:24 -------------------------------------------------------------------------------- Problem List Details Patient Name: Date of Service: Jonathan [name], Jonathan [name] 07/07/2020 1:30 PM Medical Record Number: [id number] Patient Account Number: [id number] Date of Birth/Sex: Treating RN: 12/06/1950 (69 y.o. Jonathan [name] Primary Care Provider: Marton [name] Other Clinician: Referring Provider: Treating Provider/Extender: Jonathan [name]  in Treatment: 22 Active Problems ICD-10 Encounter Code Description Active Date MDM Diagnosis E11.621 Type 2 diabetes mellitus with foot ulcer 01/30/2020 No Yes E11.51 Type 2 diabetes mellitus with  diabetic peripheral angiopathy without gangrene 01/30/2020 No Yes L97.514 Non-pressure chronic ulcer of other part of right foot with necrosis of bone 07/07/2020 No Yes Inactive Problems ICD-10 Code Description Active Date Inactive Date L97.411 Non-pressure chronic ulcer of right heel and midfoot limited to breakdown of skin 01/30/2020 01/30/2020 L97.512 Non-pressure chronic ulcer of other part of right foot with fat layer exposed 01/30/2020 01/30/2020 Resolved Problems Electronic Signature(s) Signed: 07/07/2020 5:54:56 PM By: Jonathan Ham MD Entered By: Jonathan [name] on 07/07/2020 15:09:39 -------------------------------------------------------------------------------- Progress Note Details Patient Name: Date of Service: Jonathan [name], Jonathan [name] 07/07/2020 1:30 PM Medical Record Number: [id number] Patient Account Number: [id number] Date of Birth/Sex: Treating RN: April 29, 1951 (69 y.o. Jonathan [name] Primary Care Provider: Marton [name] Other Clinician: Referring Provider: Treating Provider/Extender: Jonathan [name] in Treatment: 22 Subjective History of Present Illness (HPI) ADMISSION 01/30/2020 This is a 69 year old man who was hospitalized with Covid for a month in January 2021. Subsequently he was discharged to [facility] skilled facility for rehabilitation. His wife states that when he got home he had areas on the right lateral heel, right lateral fifth metatarsal head. They have been using Santyl to the right lateral heel no dressing on the right lateral foot. Our intake nurse discovered in small area on the right lateral great toe. He is minimally ambulatory at this point. He does not have a known history of PAD although he is a diabetic. 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 Still a small open area on the medial ffirth metatarsal head. But this is also better. He has not heard anything about his arterial studies that we ordered 7/13; patient's review with vein and vascular/noninvasive testing is not till 8/3. Still an open area on the right lateral first met head. The area on the right lateral heel remains closed although it is callused 7/27; arterial studies on 8 3. Still has the open area on the right fifth metatarsal head. We have been using Hydrofera Blue 8/10-Comes a 2 weeks, had his vascular studies done, ABIs on both sides are reasonably good, TBI on the right is 145 Continuing with Hydrofera Blue, the wound is slightly larger than last time. 8/31; months since I last saw this man. We have been using Hydrofera Blue to the small remaining area on the right fifth metatarsal head. He did have arterial studies done on 8/3 and while these were not terrible they certainly were normal. He is noncompressible on the right but with triphasic waveforms and a TBI of 0.96 on the left he is noncompressible with monophasic waveforms at the PTA triphasic at the dorsalis pedis with a TBI of 0.59 9/14 this patient still has a punched-out area on the right fifth metatarsal head lateral aspect. This is very painful. I looked back on his arterial studies his ABIs were noncompressible but with triphasic waveforms and a TBI of 0.96. We have been using Iodoflex 9/27; this patient has a  punched-out wound on the right fifth metatarsal head lateral aspect. Very painful. He is a minimal ambulator and very difficult  to pick up on claudication. I have once again reviewed his arterial studies which were really not all that impressive. He is noncompressible on the right but he had triphasic waveforms at the PTA and DP and a great toe pressure of 145 with a TBI of 0.96 we have been using Iodoflex without any improvement 10/5; x-ray from last week suggested osteomyelitis showing lateral demineralization in the fifth metatarsal head. He will need a noncontrasted MRI secondary to severe chronic renal failure last creatinine I see is 4.921 09/22/2019. We have been using Iodoflex 10/19; still using Iodoflex. Comes in today with a post debridement improvement in the wound bed. I removed skin and subcutaneous tissues some denuded tendon. The wound is small punched out with some undermining but generally a lot better surface than 2 weeks ago. I have him on Augmentin due to a plain x-ray that suggested osteomyelitis. His noncontrasted MRI is on 4/26. The PCR culture that I did last time [not bone] group C strep as well as Peptostreptococcus. I have him on Augmentin 500/125 1 p.o. every 24 after dialysis on dialysis days 11/4; the patient is still using Iodoflex. His MRI shows osteomyelitis of the proximal phalanx of the right fifth toe as well as the head and neck of the fifth metatarsal. I have him on Augmentin not making much progress. I suspect he is going to require an ID consultation. He is on dialysis 11/9; I stopped his Augmentin last week. T oday I used rongeurs in order to get a small piece of bone for culture and pathology. We have been using Iodoflex to the wound surface 11/16; the specimen I bone I obtained last time was negative for acute osteomyelitis. Culture showed rare Staphylococcus Caprae presumably coag negative I am not sure of the significance of this. He had a previous MRI that  suggested osteomyelitis of the proximal fifth toe and the fifth met head as well as the neck of the fifth metatarsal. He went to see infectious disease and was seen by Dr. Gale Journey. He did not feel that the patient had osteomyelitis or soft tissue infection he did not feel that this represented an infected wound at all. He did not feel antibiotics were indicated imaging suggested of a bone the infection will need to be confirmed with bone biopsy and culture which I did as noted they did not confirm osteomyelitis or a true pathogen. Nevertheless to all of this the patient still has a very. Painful periwound and a necrotic nonviable wound surface with exposed bone 11/30; absolutely no improvement here. This is a deeper undermining wound almost all exposed bone. I have never seen any viable tissue here. We have been using silver alginate. He has vascular evaluation on 12/14. Fortunately he is not complaining of a lot of pain I have repeated the PCR culture today. I did 1 in October and treated him with a course of Augmentin this did not help Objective Constitutional Sitting or standing Blood Pressure is within target range for patient.. Pulse regular and within target range for patient.Marland Kitchen Respirations regular, non-labored and within target range.. Temperature is normal and within the target range for the patient.Marland Kitchen Appears in no distress. Vitals Time Taken: 2:07 PM, Height: 71 in, Weight: 145 lbs, BMI: 20.2, Temperature: 97.7 F, Pulse: 86 bpm, Respiratory Rate: 18 breaths/min, Blood Pressure: 116/78 mmHg. General Notes: pt. didn't check sugar Cardiovascular Pedal pulses faintly palpable at the dorsalis pedis not at the posterior tibia. I cannot feel his popliteal pulse. General  Notes: Wound exam; right lateral fifth metatarsal head necrotic material over the surface nothing but bone underneath significant undermining. I used a #5 curette to do a bone scraping specimen obtained vigorously for PCR once again.  There is still palpable tenderness but no obvious infection in the soft tissues. Integumentary (Hair, Skin) Wound #1 status is Open. Original cause of wound was Gradually Appeared. The wound is located on the Right,Lateral Foot. The wound measures 1cm length x 1cm width x 0.6cm depth; 0.785cm^2 area and 0.471cm^3 volume. There is bone and Fat Layer (Subcutaneous Tissue) exposed. There is no tunneling or undermining noted. There is a medium amount of serosanguineous drainage noted. The wound margin is well defined and not attached to the wound base. There is no granulation within the wound bed. There is a large (67-100%) amount of necrotic tissue within the wound bed including Adherent Slough. Assessment Active Problems ICD-10 Type 2 diabetes mellitus with foot ulcer Type 2 diabetes mellitus with diabetic peripheral angiopathy without gangrene Non-pressure chronic ulcer of other part of right foot with necrosis of bone Procedures Wound #1 Pre-procedure diagnosis of Wound #1 is a Diabetic Wound/Ulcer of the Lower Extremity located on the Right,Lateral Foot .Severity of Tissue Pre Debridement is: Fat layer exposed. There was a Excisional Skin/Subcutaneous Tissue/Muscle/Bone Debridement with a total area of 0.25 sq cm performed by Jonathan [name]., MD. With the following instrument(s): Curette to remove Viable and Non-Viable tissue/material. Material removed includes Bone,Subcutaneous Tissue, Slough, Skin: Dermis, and Skin: Epidermis after achieving pain control using Lidocaine 5% topical ointment. 1 specimen was taken by a Tissue Culture and sent to the lab per facility protocol. A time out was conducted at 14:36, prior to the start of the procedure. A Moderate amount of bleeding was controlled with Pressure. The procedure was tolerated well with a pain level of 0 throughout and a pain level of 0 following the procedure. Post Debridement Measurements: 1cm length x 1cm width x 0.6cm depth;  0.471cm^3 volume. Character of Wound/Ulcer Post Debridement is improved. Severity of Tissue Post Debridement is: Fat layer exposed. Post procedure Diagnosis Wound #1: Same as Pre-Procedure Plan Follow-up Appointments: Return Appointment in 2 weeks. Dressing Change Frequency: Change Dressing every other day. Skin Barriers/Peri-Wound Care: Skin Prep Wound Cleansing: May shower with protection. - use a cast protector on days not changing. May shower and wash wound with soap and water. - with dressings changes. Primary Wound Dressing: Wound #1 Right,Lateral Foot: Calcium Alginate with Silver Secondary Dressing: Foam Border Off-Loading: Open toe surgical shoe to: - right foot Other: - float right heel with pillows to offload pressure to heel while resting in chair and bed. no pressure to right foot. Laboratory ordered were: Anerobic culture PCR right foot - PCR right foot , non healing wopund 1. I am continue with silver alginate 2. I did a vigorous bone scraping and debridement for PCR. If this is positive I am going to call Dr. Valentino Saxon who is his nephrologist on [address] to see about IV antibiotics. Dr. Gale Journey did not agree with this I am just not sure what else to do here. 3. His arterial evaluation is on 12/14 4. Once again this is not a healthy wound there is very little healthy tissue here for any attempt at healing I am concerned that he is going to end up with an amputation and I am uncertain whether he has enough blood flow to tolerate it ray amputation Electronic Signature(s) Signed: 07/07/2020 5:54:56 PM By: Jonathan Ham  MD Entered By: Jonathan [name] on 07/07/2020 15:15:43 -------------------------------------------------------------------------------- SuperBill Details Patient Name: Date of Service: Jonathan [name], Jonathan [name] 07/07/2020 Medical Record Number: [id number] Patient Account Number: [id number] Date of Birth/Sex: Treating RN: 06-16-1951 (69 y.o. Jonathan [name]) Jonathan [name] Primary Care Provider: Marton [name] Other Clinician: Referring Provider: Treating Provider/Extender: Jonathan [name] in Treatment: 22 Diagnosis Coding ICD-10 Codes Code Description [phone] Type 2 diabetes mellitus with foot ulcer E11.51 Type 2 diabetes mellitus with diabetic peripheral angiopathy without gangrene L97.514 Non-pressure chronic ulcer of other part of right foot with necrosis of bone Facility Procedures CPT4 Code: [id number] Description: 67209 - DEB BONE 20 SQ CM/< ICD-10 Diagnosis Description L97.514 Non-pressure chronic ulcer of other part of right foot with necrosis of bo E11.621 Type 2 diabetes mellitus with foot ulcer Modifier: ne Quantity: 1 Physician Procedures CPT4: Description Modifier Code [id number] Debridement; bone (includes epidermis, dermis, subQ tissue, muscle and/or fascia, if performed) 1st 20 sqcm or less ICD-10 Diagnosis Description L97.514 Non-pressure chronic ulcer of other part of right foot with  necrosis of bone E11.621 Type 2 diabetes mellitus with foot ulcer Quantity: 1 Electronic Signature(s) Signed: 07/07/2020 5:54:56 PM By: Jonathan Ham MD Entered By: Jonathan [name] on 07/07/2020 15:15:58

## 2020-07-21 ENCOUNTER — Encounter: Payer: Self-pay | Admitting: Vascular Surgery

## 2020-07-21 ENCOUNTER — Other Ambulatory Visit: Payer: Self-pay

## 2020-07-21 ENCOUNTER — Ambulatory Visit: Payer: Medicare Other | Admitting: Vascular Surgery

## 2020-07-21 ENCOUNTER — Encounter (HOSPITAL_BASED_OUTPATIENT_CLINIC_OR_DEPARTMENT_OTHER): Payer: Medicare Other | Attending: Internal Medicine | Admitting: Internal Medicine

## 2020-07-21 VITALS — BP 120/73 | HR 85 | Temp 97.3°F | Resp 20 | Ht 72.0 in | Wt 149.0 lb

## 2020-07-21 DIAGNOSIS — L97514 Non-pressure chronic ulcer of other part of right foot with necrosis of bone: Secondary | ICD-10-CM | POA: Insufficient documentation

## 2020-07-21 DIAGNOSIS — E1151 Type 2 diabetes mellitus with diabetic peripheral angiopathy without gangrene: Secondary | ICD-10-CM | POA: Insufficient documentation

## 2020-07-21 DIAGNOSIS — E11621 Type 2 diabetes mellitus with foot ulcer: Secondary | ICD-10-CM | POA: Insufficient documentation

## 2020-07-21 DIAGNOSIS — I70235 Atherosclerosis of native arteries of right leg with ulceration of other part of foot: Secondary | ICD-10-CM

## 2020-07-21 DIAGNOSIS — Z8616 Personal history of COVID-19: Secondary | ICD-10-CM | POA: Insufficient documentation

## 2020-07-21 MED ORDER — SODIUM CHLORIDE 0.9 % IV SOLN: 250.0000 mL | INTRAVENOUS | Status: DC | PRN

## 2020-07-21 MED ORDER — SODIUM CHLORIDE 0.9% FLUSH: 3.0000 mL | Freq: Two times a day (BID) | INTRAVENOUS | Status: DC

## 2020-07-21 MED ORDER — SODIUM CHLORIDE 0.9% FLUSH: 3.0000 mL | INTRAVENOUS | Status: DC | PRN

## 2020-07-21 NOTE — Progress Notes (Signed)
ASSESSMENT & PLAN:  69 y.o. 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  He has MRI evidence of osteomyelitis of the right fifth digit and metatarsal.  Need angiography with possible intervention left common femoral artery access 08/05/2020.  I instructed him to hold his Coumadin 12/24.  If we were able to restore inline flow to his foot, we will arrange for ray amputation of right fifth toe during same admission.  CHIEF COMPLAINT:   Right toe ulcer  HISTORY:  HISTORY OF PRESENT ILLNESS: Jonathan Sagar Sr. is a 69 y.o. male referred to clinic for evaluation of right lateral fifth toe ulcer which has been present for >5 months.  Patient's history is significant for end-stage renal disease on HD MWF (access created by [facility]), DM2 (A1c 6.5), stroke for which he is maintained on chronic warfarin therapy. He is ambulatory, but does not walk much. No history of claudication or rest pain.  Past Medical History:  Diagnosis Date  . Diabetes mellitus without complication ([place])   . FUO (fever of unknown origin) 09/17/2019  . Hypertension   . Renal disorder 2015   right kidney transplant  . Stroke [facility])     Past Surgical History:  Procedure Laterality Date  . BACK SURGERY     Has had 2 back surgeries  . BUBBLE STUDY  09/18/2019   Procedure: BUBBLE STUDY;  Surgeon: Buford Dresser, MD;  Location: [facility] ENDOSCOPY;  Service: Cardiovascular;;  . Depression    . HAND SURGERY Right   . HAND SURGERY    . IR REMOVAL TUN CV CATH W/O FL  09/03/2019  . KIDNEY TRANSPLANT     November 2015  . Memory loss    . NEPHRECTOMY TRANSPLANTED ORGAN    . TEE WITHOUT CARDIOVERSION N/A 09/18/2019   Procedure: TRANSESOPHAGEAL ECHOCARDIOGRAM (TEE);  Surgeon: Buford Dresser, MD;  Location: [facility] ENDOSCOPY;  Service: Cardiovascular;  Laterality: N/A;    Family History  Problem Relation Age of Onset  . Diabetes Mellitus II Mother   . Diabetes Mellitus  II Brother     Social History   Socioeconomic History  . Marital status: Married    Spouse name: Not on file  . Number of children: Not on file  . Years of education: Not on file  . Highest education level: Not on file  Occupational History  . Not on file  Tobacco Use  . Smoking status: Former Research scientist (life sciences)  . Smokeless tobacco: Never Used  Vaping Use  . Vaping Use: Never used  Substance and Sexual Activity  . Alcohol use: Never  . Drug use: Never  . Sexual activity: Not on file  Other Topics Concern  . Not on file  Social History Narrative  . Not on file   Social Determinants of Health   Financial Resource Strain: Not on file  Food Insecurity: Not on file  Transportation Needs: Not on file  Physical Activity: Not on file  Stress: Not on file  Social Connections: Not on file  Intimate Partner Violence: Not on file    No Known Allergies  Current Outpatient Medications  Medication Sig Dispense Refill  . allopurinol (ZYLOPRIM) 100 MG tablet Take 100 mg by mouth daily.     Marland Kitchen aspirin 81 MG chewable tablet Chew 81 mg by mouth daily.     Marland Kitchen atorvastatin (LIPITOR) 40 MG tablet Take 40 mg by mouth daily.    Marland Kitchen b complex-vitamin c-folic acid (NEPHRO-VITE) 0.8 MG TABS  tablet Take 1 tablet by mouth daily.    Marland Kitchen glucagon, human recombinant, (GLUCAGEN) 1 MG injection Inject 1 mg into the muscle once as needed for low blood sugar. (Patient not taking: No sig reported)    . Insulin Pen Needle (BD PEN NEEDLE NANO U/F) 32G X 4 MM MISC 1 each by Other route as needed (insulin).     Marland Kitchen levalbuterol (XOPENEX HFA) 45 MCG/ACT inhaler Inhale 2 puffs into the lungs every 8 (eight) hours as needed for wheezing. 1 Inhaler 12  . lidocaine-prilocaine (EMLA) cream Apply 1 application topically 3 (three) times a week. 30 minutes prior to Dialysis - MWF  11  . losartan (COZAAR) 25 MG tablet Take 25 mg by mouth daily.  (Patient not taking: No sig reported)    . methocarbamol (ROBAXIN) 500 MG tablet Take by  mouth. (Patient not taking: Reported on 07/21/2020)    . metoCLOPramide (REGLAN) 5 MG tablet Take 5 mg by mouth 3 (three) times daily with meals.    . metoprolol succinate (TOPROL-XL) 50 MG 24 hr tablet Take 50 mg by mouth daily.  (Patient not taking: No sig reported)    . NOVOLOG FLEXPEN 100 UNIT/ML FlexPen Inject 0-10 Units into the skin as needed for high blood sugar. Per sliding scale  0  . Nutritional Supplements (FEEDING SUPPLEMENT, NEPRO CARB STEADY,) LIQD Take 237 mLs by mouth 3 (three) times daily between meals. (Patient not taking: No sig reported)  0  . predniSONE (DELTASONE) 5 MG tablet Take by mouth.    . senna (SENOKOT) 8.6 MG tablet Take 1 tablet by mouth as needed for constipation.    . sertraline (ZOLOFT) 100 MG tablet Take by mouth.    . sevelamer carbonate (RENVELA) 800 MG tablet Take 800 mg by mouth 3 (three) times daily.    Tyler Aas FLEXTOUCH 200 UNIT/ML FlexTouch Pen Inject into the skin.    Marland Kitchen warfarin (COUMADIN) 4 MG tablet Take 1 tablet (4 mg total) by mouth daily at 6 PM. (Patient taking differently: Take 4 mg by mouth See admin instructions. 6mg  on MWF, Tues, Thurs, Sat and Sun - 9mg .)    . warfarin (COUMADIN) 6 MG tablet Take by mouth. (Patient not taking: Reported on 06/18/2020)     No current facility-administered medications for this visit.    REVIEW OF SYSTEMS:  [X]  denotes positive finding, [ ]  denotes negative finding Cardiac  Comments:  Chest pain or chest pressure:    Shortness of breath upon exertion:    Short of breath when lying flat:    Irregular heart rhythm:        Vascular    Pain in calf, thigh, or hip brought on by ambulation:    Pain in feet at night that wakes you up from your sleep:     Blood clot in your veins:    Leg swelling:         Pulmonary    Oxygen at home:    Productive cough:     Wheezing:         Neurologic    Sudden weakness in arms or legs:     Sudden numbness in arms or legs:     Sudden onset of difficulty speaking or  slurred speech:    Temporary loss of vision in one eye:     Problems with dizziness:         Gastrointestinal    Blood in stool:     Vomited blood:  Genitourinary    Burning when urinating:     Blood in urine:        Psychiatric    Major depression:         Hematologic    Bleeding problems:    Problems with blood clotting too easily:        Skin    Rashes or ulcers:        Constitutional    Fever or chills:     PHYSICAL EXAM:   Vitals:   07/21/20 0940  BP: 120/73  Pulse: 85  Resp: 20  Temp: (!) 97.3 F (36.3 C)  SpO2: 100%  Weight: 149 lb (67.6 kg)  Height: 6' (1.829 m)   Constitutional: Chronically ill appearing in no distress. Appears well nourished.  Neurologic: Normal gait and station. CN intact. No weakness. No sensory loss. Psychiatric: Mood and affect symmetric and appropriate. Eyes: No icterus. No conjunctival pallor. Ears, nose, throat: mucous membranes moist. Midline trachea. No carotid bruit. Cardiac: regular rate and rhythm.  Respiratory: unlabored. Abdominal: soft, non-tender, non-distended. No palpable pulsatile abdominal mass. Peripheral vascular:  Femoral pulse: L 2+ / R 2+  Dorsalis pedis pulse: L absent / R absent   Posterior tibial pulse: L absent  / R absent  Extremity: No edema. No cyanosis. No pallor.  Skin: No gangrene. Right foot ulcer about size of a nickel with necrotic base overlaying the lateral aspect of the right fifth MTP joint.  Lymphatic: No Stemmer's sign. No palpable lymphadenopathy.  DATA REVIEW:    Most recent CBC CBC Latest Ref Rng & Units 09/21/2019 09/20/2019 09/19/2019  WBC 4.0 - 10.5 K/uL 8.9 9.2 9.1  Hemoglobin 13.0 - 17.0 g/dL 10.7(L) 10.7(L) 10.1(L)  Hematocrit 39.0 - 52.0 % 34.7(L) 34.9(L) 33.5(L)  Platelets 150 - 400 K/uL 290 268 278     Most recent CMP CMP Latest Ref Rng & Units 09/22/2019 09/21/2019 09/20/2019  Glucose 70 - 99 mg/dL 151(H) 165(H) 223(H)  BUN 8 - 23 mg/dL 25(H) 42(H) 30(H)  Creatinine  0.61 - 1.24 mg/dL 4.92(H) 7.15(H) 5.43(H)  Sodium 135 - 145 mmol/L 136 135 136  Potassium 3.5 - 5.1 mmol/L 4.2 4.8 3.9  Chloride 98 - 111 mmol/L 99 93(L) 94(L)  CO2 22 - 32 mmol/L 25 24 28   Calcium 8.9 - 10.3 mg/dL 8.7(L) 9.1 8.8(L)  Total Protein 6.5 - 8.1 g/dL - - 6.6  Total Bilirubin 0.3 - 1.2 mg/dL - - 0.5  Alkaline Phos 38 - 126 U/L - - 76  AST 15 - 41 U/L - - 19  ALT 0 - 44 U/L - - 21    Renal function CrCl cannot be calculated (Patient's most recent lab result is older than the maximum 21 days allowed.).  Hgb A1c MFr Bld (%)  Date Value  07/31/2019 6.3 (H)    LDL Cholesterol  Date Value Ref Range Status  08/01/2019 49 0 - 99 mg/dL Final    Comment:           Total Cholesterol/HDL:CHD Risk Coronary Heart Disease Risk Table                     Men   Women  1/2 Average Risk   3.4   3.3  Average Risk       5.0   4.4  2 X Average Risk   9.6   7.1  3 X Average Risk  23.4   11.0  Use the calculated Patient Ratio above and the CHD Risk Table to determine the patient's CHD Risk.        ATP III CLASSIFICATION (LDL):  <100     mg/dL   Optimal  100-129  mg/dL   Near or Above                    Optimal  130-159  mg/dL   Borderline  160-189  mg/dL   High  >190     mg/dL   Very High Performed at [facility] [address], [place], [place] [address]      Vascular Imaging: MRI R foot 10/26 Osteomyelitis of right fifth toe / metatarsal.  ABI 03/10/20   [name], MD [facility] Phone Number: [phone] 07/21/2020 9:49 AM

## 2020-07-21 NOTE — H&P (View-Only) (Signed)
ASSESSMENT & PLAN:  69 y.o. 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  He has MRI evidence of osteomyelitis of the right fifth digit and metatarsal.  Need angiography with possible intervention left common femoral artery access 08/05/2020.  I instructed him to hold his Coumadin 12/24.  If we were able to restore inline flow to his foot, we will arrange for ray amputation of right fifth toe during same admission.  CHIEF COMPLAINT:   Right toe ulcer  HISTORY:  HISTORY OF PRESENT ILLNESS: Jonathan [name]. is a 69 y.o. male referred to clinic for evaluation of right lateral fifth toe ulcer which has been present for >5 months.  Patient's history is significant for end-stage renal disease on HD MWF (access created by [facility]), DM2 (A1c 6.5), stroke for which he is maintained on chronic warfarin therapy. He is ambulatory, but does not walk much. No history of claudication or rest pain.  Past Medical History:  Diagnosis Date  . Diabetes mellitus without complication ([place])   . FUO (fever of unknown origin) 09/17/2019  . Hypertension   . Renal disorder 2015   right kidney transplant  . Stroke [facility])     Past Surgical History:  Procedure Laterality Date  . BACK SURGERY     Has had 2 back surgeries  . BUBBLE STUDY  09/18/2019   Procedure: BUBBLE STUDY;  Surgeon: Buford Dresser, MD;  Location: [facility] ENDOSCOPY;  Service: Cardiovascular;;  . Depression    . HAND SURGERY Right   . HAND SURGERY    . IR REMOVAL TUN CV CATH W/O FL  09/03/2019  . KIDNEY TRANSPLANT     November 2015  . Memory loss    . NEPHRECTOMY TRANSPLANTED ORGAN    . TEE WITHOUT CARDIOVERSION N/A 09/18/2019   Procedure: TRANSESOPHAGEAL ECHOCARDIOGRAM (TEE);  Surgeon: Buford Dresser, MD;  Location: [facility] ENDOSCOPY;  Service: Cardiovascular;  Laterality: N/A;    Family History  Problem Relation Age of Onset  . Diabetes Mellitus II Mother   . Diabetes Mellitus  II Brother     Social History   Socioeconomic History  . Marital status: Married    Spouse name: Not on file  . Number of children: Not on file  . Years of education: Not on file  . Highest education level: Not on file  Occupational History  . Not on file  Tobacco Use  . Smoking status: Former Research scientist (life sciences)  . Smokeless tobacco: Never Used  Vaping Use  . Vaping Use: Never used  Substance and Sexual Activity  . Alcohol use: Never  . Drug use: Never  . Sexual activity: Not on file  Other Topics Concern  . Not on file  Social History Narrative  . Not on file   Social Determinants of Health   Financial Resource Strain: Not on file  Food Insecurity: Not on file  Transportation Needs: Not on file  Physical Activity: Not on file  Stress: Not on file  Social Connections: Not on file  Intimate Partner Violence: Not on file    No Known Allergies  Current Outpatient Medications  Medication Sig Dispense Refill  . allopurinol (ZYLOPRIM) 100 MG tablet Take 100 mg by mouth daily.     Marland Kitchen aspirin 81 MG chewable tablet Chew 81 mg by mouth daily.     Marland Kitchen atorvastatin (LIPITOR) 40 MG tablet Take 40 mg by mouth daily.    Marland Kitchen b complex-vitamin c-folic acid (NEPHRO-VITE) 0.8 MG TABS  tablet Take 1 tablet by mouth daily.    Marland Kitchen glucagon, human recombinant, (GLUCAGEN) 1 MG injection Inject 1 mg into the muscle once as needed for low blood sugar. (Patient not taking: No sig reported)    . Insulin Pen Needle (BD PEN NEEDLE NANO U/F) 32G X 4 MM MISC 1 each by Other route as needed (insulin).     Marland Kitchen levalbuterol (XOPENEX HFA) 45 MCG/ACT inhaler Inhale 2 puffs into the lungs every 8 (eight) hours as needed for wheezing. 1 Inhaler 12  . lidocaine-prilocaine (EMLA) cream Apply 1 application topically 3 (three) times a week. 30 minutes prior to Dialysis - MWF  11  . losartan (COZAAR) 25 MG tablet Take 25 mg by mouth daily.  (Patient not taking: No sig reported)    . methocarbamol (ROBAXIN) 500 MG tablet Take by  mouth. (Patient not taking: Reported on 07/21/2020)    . metoCLOPramide (REGLAN) 5 MG tablet Take 5 mg by mouth 3 (three) times daily with meals.    . metoprolol succinate (TOPROL-XL) 50 MG 24 hr tablet Take 50 mg by mouth daily.  (Patient not taking: No sig reported)    . NOVOLOG FLEXPEN 100 UNIT/ML FlexPen Inject 0-10 Units into the skin as needed for high blood sugar. Per sliding scale  0  . Nutritional Supplements (FEEDING SUPPLEMENT, NEPRO CARB STEADY,) LIQD Take 237 mLs by mouth 3 (three) times daily between meals. (Patient not taking: No sig reported)  0  . predniSONE (DELTASONE) 5 MG tablet Take by mouth.    . senna (SENOKOT) 8.6 MG tablet Take 1 tablet by mouth as needed for constipation.    . sertraline (ZOLOFT) 100 MG tablet Take by mouth.    . sevelamer carbonate (RENVELA) 800 MG tablet Take 800 mg by mouth 3 (three) times daily.    Tyler Aas FLEXTOUCH 200 UNIT/ML FlexTouch Pen Inject into the skin.    Marland Kitchen warfarin (COUMADIN) 4 MG tablet Take 1 tablet (4 mg total) by mouth daily at 6 PM. (Patient taking differently: Take 4 mg by mouth See admin instructions. 6mg  on MWF, Tues, Thurs, Sat and Sun - 9mg .)    . warfarin (COUMADIN) 6 MG tablet Take by mouth. (Patient not taking: Reported on 06/18/2020)     No current facility-administered medications for this visit.    REVIEW OF SYSTEMS:  [X]  denotes positive finding, [ ]  denotes negative finding Cardiac  Comments:  Chest pain or chest pressure:    Shortness of breath upon exertion:    Short of breath when lying flat:    Irregular heart rhythm:        Vascular    Pain in calf, thigh, or hip brought on by ambulation:    Pain in feet at night that wakes you up from your sleep:     Blood clot in your veins:    Leg swelling:         Pulmonary    Oxygen at home:    Productive cough:     Wheezing:         Neurologic    Sudden weakness in arms or legs:     Sudden numbness in arms or legs:     Sudden onset of difficulty speaking or  slurred speech:    Temporary loss of vision in one eye:     Problems with dizziness:         Gastrointestinal    Blood in stool:     Vomited blood:  Genitourinary    Burning when urinating:     Blood in urine:        Psychiatric    Major depression:         Hematologic    Bleeding problems:    Problems with blood clotting too easily:        Skin    Rashes or ulcers:        Constitutional    Fever or chills:     PHYSICAL EXAM:   Vitals:   07/21/20 0940  BP: 120/73  Pulse: 85  Resp: 20  Temp: (!) 97.3 F (36.3 C)  SpO2: 100%  Weight: 149 lb (67.6 kg)  Height: 6' (1.829 m)   Constitutional: Chronically ill appearing in no distress. Appears well nourished.  Neurologic: Normal gait and station. CN intact. No weakness. No sensory loss. Psychiatric: Mood and affect symmetric and appropriate. Eyes: No icterus. No conjunctival pallor. Ears, nose, throat: mucous membranes moist. Midline trachea. No carotid bruit. Cardiac: regular rate and rhythm.  Respiratory: unlabored. Abdominal: soft, non-tender, non-distended. No palpable pulsatile abdominal mass. Peripheral vascular:  Femoral pulse: L 2+ / R 2+  Dorsalis pedis pulse: L absent / R absent   Posterior tibial pulse: L absent  / R absent  Extremity: No edema. No cyanosis. No pallor.  Skin: No gangrene. Right foot ulcer about size of a nickel with necrotic base overlaying the lateral aspect of the right fifth MTP joint.  Lymphatic: No Stemmer's sign. No palpable lymphadenopathy.  DATA REVIEW:    Most recent CBC CBC Latest Ref Rng & Units 09/21/2019 09/20/2019 09/19/2019  WBC 4.0 - 10.5 K/uL 8.9 9.2 9.1  Hemoglobin 13.0 - 17.0 g/dL 10.7(L) 10.7(L) 10.1(L)  Hematocrit 39.0 - 52.0 % 34.7(L) 34.9(L) 33.5(L)  Platelets 150 - 400 K/uL 290 268 278     Most recent CMP CMP Latest Ref Rng & Units 09/22/2019 09/21/2019 09/20/2019  Glucose 70 - 99 mg/dL 151(H) 165(H) 223(H)  BUN 8 - 23 mg/dL 25(H) 42(H) 30(H)  Creatinine  0.61 - 1.24 mg/dL 4.92(H) 7.15(H) 5.43(H)  Sodium 135 - 145 mmol/L 136 135 136  Potassium 3.5 - 5.1 mmol/L 4.2 4.8 3.9  Chloride 98 - 111 mmol/L 99 93(L) 94(L)  CO2 22 - 32 mmol/L 25 24 28   Calcium 8.9 - 10.3 mg/dL 8.7(L) 9.1 8.8(L)  Total Protein 6.5 - 8.1 g/dL - - 6.6  Total Bilirubin 0.3 - 1.2 mg/dL - - 0.5  Alkaline Phos 38 - 126 U/L - - 76  AST 15 - 41 U/L - - 19  ALT 0 - 44 U/L - - 21    Renal function CrCl cannot be calculated (Patient's most recent lab result is older than the maximum 21 days allowed.).  Hgb A1c MFr Bld (%)  Date Value  07/31/2019 6.3 (H)    LDL Cholesterol  Date Value Ref Range Status  08/01/2019 49 0 - 99 mg/dL Final    Comment:           Total Cholesterol/HDL:CHD Risk Coronary Heart Disease Risk Table                     Men   Women  1/2 Average Risk   3.4   3.3  Average Risk       5.0   4.4  2 X Average Risk   9.6   7.1  3 X Average Risk  23.4   11.0  Use the calculated Patient Ratio above and the CHD Risk Table to determine the patient's CHD Risk.        ATP III CLASSIFICATION (LDL):  <100     mg/dL   Optimal  100-129  mg/dL   Near or Above                    Optimal  130-159  mg/dL   Borderline  160-189  mg/dL   High  >190     mg/dL   Very High Performed at [facility] [address] [place], [place] [address]      Vascular Imaging: MRI R foot 10/26 Osteomyelitis of right fifth toe / metatarsal.  ABI 03/10/20   [name], MD [facility] Phone Number: [phone] 07/21/2020 9:49 AM

## 2020-07-22 NOTE — Progress Notes (Signed)
Jonathan [name], Jonathan [name] ([id number]) Visit Report for 07/21/2020 Arrival Information Details Patient Name: Date of Service: Jonathan [name], Jonathan [name] 07/21/2020 11:00 A M Medical Record Number: [id number] Patient Account Number: [id number] Date of Birth/Sex: Treating RN: [birth year] (69 y.o. [name] Primary Care Benjie Ricketson: Marton Redwood Other Clinician: Referring Alejandria Wessells: Treating Quenesha Douglass/Extender: Estelle Grumbles in Treatment: 24 Visit Information History Since Last Visit Added or deleted any medications: No Patient Arrived: Wheel Chair Any new allergies or adverse reactions: No Arrival Time: 10:56 Had a fall or experienced change in No Accompanied By: spouse activities of daily living that may affect Transfer Assistance: None risk of falls: Patient Identification Verified: Yes Signs or symptoms of abuse/neglect since No Secondary Verification Process Completed: Yes last visito Patient Requires Transmission-Based Precautions: No Hospitalized since last visit: No Patient Has Alerts: Yes Implantable device outside of the clinic No Patient Alerts: Patient on Blood Thinner excluding Right ABI: non compress cellular tissue based products placed in the center since last visit: Has Dressing in Place as Prescribed: No Has Footwear/Offloading in Place as Yes Prescribed: Right: Surgical Shoe with Pressure Relief Insole Pain Present Now: No Notes dressing removed at [facility] prior to appointment Electronic Signature(s) Signed: 07/21/2020 4:55:24 PM By: Baruch Gouty RN, BSN Entered By: Baruch Gouty on 07/21/2020 11:04:19 -------------------------------------------------------------------------------- Clinic Level of Care Assessment Details Patient Name: Date of Service: Jonathan [name], Jonathan [name] 07/21/2020 11:00 [facility] Record Number: [id number] Patient Account Number: [id number] Date of Birth/Sex: Treating RN: 1950/09/19 (69 y.o. Burnadette Pop, Lauren Primary Care Dafne Nield:  Marton Redwood Other Clinician: Referring Haydan Wedig: Treating Hyun Marsalis/Extender: Estelle Grumbles in Treatment: 24 Clinic Level of Care Assessment Items TOOL 4 Quantity Score X- 1 0 Use when only an EandM is performed on FOLLOW-UP visit ASSESSMENTS - Nursing Assessment / Reassessment X- 1 10 Reassessment of Co-morbidities (includes updates in patient status) X- 1 5 Reassessment of Adherence to Treatment Plan ASSESSMENTS - Wound and Skin A ssessment / Reassessment X - Simple Wound Assessment / Reassessment - one wound 1 5 []  - 0 Complex Wound Assessment / Reassessment - multiple wounds X- 1 10 Dermatologic / Skin Assessment (not related to wound area) ASSESSMENTS - Focused Assessment X- 1 5 Circumferential Edema Measurements - multi extremities X- 1 10 Nutritional Assessment / Counseling / Intervention X- 1 5 Lower Extremity Assessment (monofilament, tuning fork, pulses) []  - 0 Peripheral Arterial Disease Assessment (using hand held doppler) ASSESSMENTS - Ostomy and/or Continence Assessment and Care []  - 0 Incontinence Assessment and Management []  - 0 Ostomy Care Assessment and Management (repouching, etc.) PROCESS - Coordination of Care X - Simple Patient / Family Education for ongoing care 1 15 []  - 0 Complex (extensive) Patient / Family Education for ongoing care X- 1 10 Staff obtains Programmer, systems, Records, T Results / Process Orders est []  - 0 Staff telephones HHA, Nursing Homes / Clarify orders / etc []  - 0 Routine Transfer to another Facility (non-emergent condition) []  - 0 Routine Hospital Admission (non-emergent condition) []  - 0 New Admissions / Biomedical engineer / Ordering NPWT Apligraf, etc. , []  - 0 Emergency Hospital Admission (emergent condition) X- 1 10 Simple Discharge Coordination []  - 0 Complex (extensive) Discharge Coordination PROCESS - Special Needs []  - 0 Pediatric / Minor Patient Management []  - 0 Isolation Patient  Management []  - 0 Hearing / Language / Visual special needs []  - 0 Assessment of Community assistance (transportation, D/C planning, etc.) []  - 0 Additional assistance / Altered mentation []  - 0  Support Surface(s) Assessment (bed, cushion, seat, etc.) INTERVENTIONS - Wound Cleansing / Measurement X - Simple Wound Cleansing - one wound 1 5 []  - 0 Complex Wound Cleansing - multiple wounds X- 1 5 Wound Imaging (photographs - any number of wounds) []  - 0 Wound Tracing (instead of photographs) X- 1 5 Simple Wound Measurement - one wound []  - 0 Complex Wound Measurement - multiple wounds INTERVENTIONS - Wound Dressings X - Small Wound Dressing one or multiple wounds 1 10 []  - 0 Medium Wound Dressing one or multiple wounds []  - 0 Large Wound Dressing one or multiple wounds []  - 0 Application of Medications - topical []  - 0 Application of Medications - injection INTERVENTIONS - Miscellaneous []  - 0 External ear exam []  - 0 Specimen Collection (cultures, biopsies, blood, body fluids, etc.) []  - 0 Specimen(s) / Culture(s) sent or taken to Lab for analysis []  - 0 Patient Transfer (multiple staff / Civil Service fast streamer / Similar devices) []  - 0 Simple Staple / Suture removal (25 or less) []  - 0 Complex Staple / Suture removal (26 or more) []  - 0 Hypo / Hyperglycemic Management (close monitor of Blood Glucose) []  - 0 Ankle / Brachial Index (ABI) - do not check if billed separately X- 1 5 Vital Signs Has the patient been seen at the hospital within the last three years: Yes Total Score: 115 Level Of Care: New/Established - Level 3 Electronic Signature(s) Signed: 07/22/2020 9:59:17 AM By: Rhae Hammock RN Entered By: Rhae Hammock on 07/21/2020 11:45:24 -------------------------------------------------------------------------------- Encounter Discharge Information Details Patient Name: Date of Service: Jonathan [name], Jonathan [name] 07/21/2020 11:00 A M Medical Record Number:  [id number] Patient Account Number: [id number] Date of Birth/Sex: Treating RN: 1951-07-08 (69 y.o. [name] Primary Care Ladamien Rammel: Marton Redwood Other Clinician: Referring Sumie Remsen: Treating Melanie Pellot/Extender: Estelle Grumbles in Treatment: 24 Encounter Discharge Information Items Discharge Condition: Stable Ambulatory Status: Wheelchair Discharge Destination: Home Transportation: Private Auto Accompanied By: spouse Schedule Follow-up Appointment: Yes Clinical Summary of Care: Patient Declined Electronic Signature(s) Signed: 07/21/2020 4:55:24 PM By: Baruch Gouty RN, BSN Entered By: Baruch Gouty on 07/21/2020 11:53:51 -------------------------------------------------------------------------------- Lower Extremity Assessment Details Patient Name: Date of Service: Jonathan [name], Jonathan [name] 07/21/2020 11:00 A M Medical Record Number: [id number] Patient Account Number: [id number] Date of Birth/Sex: Treating RN: Mar 24, 1951 (69 y.o. [name] Primary Care Shonique Pelphrey: Marton Redwood Other Clinician: Referring Usbaldo Pannone: Treating Getsemani Lindon/Extender: Estelle Grumbles in Treatment: 24 Edema Assessment Assessed: Shirlyn Goltz: No] Patrice Paradise: No] Edema: [Left: Ye] [Right: s] Calf Left: Right: Point of Measurement: 41 cm From Medial Instep 31.5 cm Ankle Left: Right: Point of Measurement: 18 cm From Medial Instep 21 cm Electronic Signature(s) Signed: 07/21/2020 4:55:24 PM By: Baruch Gouty RN, BSN Entered By: Baruch Gouty on 07/21/2020 11:05:17 -------------------------------------------------------------------------------- Multi Wound Chart Details Patient Name: Date of Service: Jonathan [name], Jonathan [name] 07/21/2020 11:00 A M Medical Record Number: [id number] Patient Account Number: [id number] Date of Birth/Sex: Treating RN: 1950-08-16 (69 y.o. [name]) Carlene Coria Primary Care Ardell Makarewicz: Marton Redwood Other Clinician: Referring Fenris Cauble: Treating  Tamara Monteith/Extender: Estelle Grumbles in Treatment: 24 Vital Signs Height(in): 45 Pulse(bpm): 83 Weight(lbs): 145 Blood Pressure(mmHg): 131/77 Body Mass Index(BMI): 20 Temperature(F): 98.8 Respiratory Rate(breaths/min): 18 Photos: [1:No Photos Right, Lateral Foot] [N/A:N/A N/A] Wound Location: [1:Gradually Appeared] [N/A:N/A] Wounding Event: [1:Diabetic Wound/Ulcer of the Lower] [N/A:N/A] Primary Etiology: [1:Extremity Cataracts, Congestive Heart Failure, N/A] Comorbid History: [1:Hypertension, Myocardial Infarction, Hepatitis C, Type II Diabetes, End Stage Renal Disease, Dementia, Neuropathy 12/26/2019] [N/A:N/A] Date Acquired: [  1:24] [N/A:N/A] Weeks of Treatment: [1:Open] [N/A:N/A] Wound Status: [1:0.7x0.9x0.7] [N/A:N/A] Measurements L x W x D (cm) [1:0.495] [N/A:N/A] A (cm) : rea [1:0.346] [N/A:N/A] Volume (cm) : [1:-215.30%] [N/A:N/A] % Reduction in A rea: [1:-449.20%] [N/A:N/A] % Reduction in Volume: [1:12] Starting Position 1 (o'clock): [1:12] Ending Position 1 (o'clock): [1:0.8] Maximum Distance 1 (cm): [1:Yes] [N/A:N/A] Undermining: [1:Grade 2] [N/A:N/A] Classification: [1:Small] [N/A:N/A] Exudate A mount: [1:Serosanguineous] [N/A:N/A] Exudate Type: [1:red, brown] [N/A:N/A] Exudate Color: [1:Well defined, not attached] [N/A:N/A] Wound Margin: [1:Large (67-100%)] [N/A:N/A] Granulation A mount: [1:Red] [N/A:N/A] Granulation Quality: [1:Small (1-33%)] [N/A:N/A] Necrotic A mount: [1:Fat Layer (Subcutaneous Tissue): Yes N/A] Exposed Structures: [1:Bone: Yes Fascia: No Tendon: No Muscle: No Joint: No None] [N/A:N/A] Treatment Notes Wound #1 (Foot) Wound Laterality: Right, Lateral Cleanser Wound Cleanser Discharge Instruction: Cleanse the wound with wound cleanser prior to applying a clean dressing using gauze sponges, not tissue or cotton balls. Peri-Wound Care Skin Prep Discharge Instruction: Use skin prep as directed Topical Primary  Dressing KerraCel Ag Gelling Fiber Dressing, 4x5 in (silver alginate) Discharge Instruction: Apply silver alginate to wound bed as instructed Secondary Dressing ComfortFoam Border, 3x3 in (silicone border) Discharge Instruction: Apply over primary dressing as directed. Secured With Compression Wrap Compression Stockings Environmental education officer) Signed: 07/21/2020 4:37:04 PM By: Carlene Coria RN Signed: 07/21/2020 5:21:51 PM By: Linton Ham MD Entered By: Linton Ham on 07/21/2020 12:43:31 -------------------------------------------------------------------------------- Multi-Disciplinary Care Plan Details Patient Name: Date of Service: Jonathan [name], Jonathan [name] 07/21/2020 11:00 A M Medical Record Number: [id number] Patient Account Number: [id number] Date of Birth/Sex: Treating RN: Dec 22, 1950 (69 y.o. Burnadette Pop, Lauren Primary Care Franciscojavier Wronski: Marton Redwood Other Clinician: Referring Xyon Lukasik: Treating Richanda Darin/Extender: Estelle Grumbles in Treatment: 24 Active Inactive Wound/Skin Impairment Nursing Diagnoses: Knowledge deficit related to ulceration/compromised skin integrity Goals: Patient/caregiver will verbalize understanding of skin care regimen Date Initiated: 01/30/2020 Target Resolution Date: 07/30/2020 Goal Status: Active Ulcer/skin breakdown will have a volume reduction of 50% by week 8 Date Initiated: 01/30/2020 Date Inactivated: 02/18/2020 Target Resolution Date: 03/31/2020 Goal Status: Unmet Unmet Reason: comorbities Ulcer/skin breakdown will have a volume reduction of 80% by week 12 Date Initiated: 02/18/2020 Date Inactivated: 04/07/2020 Target Resolution Date: 03/20/2020 Goal Status: Unmet Unmet Reason: comorbities Ulcer/skin breakdown will heal within 14 weeks Date Initiated: 04/07/2020 Date Inactivated: 04/21/2020 Target Resolution Date: 05/07/2020 Goal Status: Unmet Unmet Reason: comorbities Interventions: Assess patient/caregiver  ability to obtain necessary supplies Assess patient/caregiver ability to perform ulcer/skin care regimen upon admission and as needed Provide education on ulcer and skin care Treatment Activities: Skin care regimen initiated : 01/30/2020 Topical wound management initiated : 01/30/2020 Notes: Electronic Signature(s) Signed: 07/22/2020 9:59:17 AM By: Rhae Hammock RN Entered By: Rhae Hammock on 07/21/2020 11:44:27 -------------------------------------------------------------------------------- Pain Assessment Details Patient Name: Date of Service: Jonathan [name], Jonathan [name] 07/21/2020 11:00 A M Medical Record Number: [id number] Patient Account Number: [id number] Date of Birth/Sex: Treating RN: 1951/06/06 (69 y.o. [name] Primary Care Ronson Hagins: Marton Redwood Other Clinician: Referring Bennie Scaff: Treating Teresa Nicodemus/Extender: Estelle Grumbles in Treatment: 24 Active Problems Location of Pain Severity and Description of Pain Patient Has Paino No Site Locations Rate the pain. Current Pain Level: 0 Character of Pain Describe the Pain: Tender Pain Management and Medication Current Pain Management: Electronic Signature(s) Signed: 07/21/2020 4:55:24 PM By: Baruch Gouty RN, BSN Entered By: Baruch Gouty on 07/21/2020 11:05:10 -------------------------------------------------------------------------------- Patient/Caregiver Education Details Patient Name: Date of Service: Jonathan [name], Jonathan [name] 12/14/2021andnbsp11:00 [place] Record Number: [id number] Patient Account Number: [id number] Date of Birth/Gender: Treating  RN: 06-Dec-1950 (69 y.o. Burnadette Pop, Lauren Primary Care Physician: Marton Redwood Other Clinician: Referring Physician: Treating Physician/Extender: Estelle Grumbles in Treatment: 24 Education Assessment Education Provided To: Patient Education Topics Provided Wound/Skin Impairment: Methods: Explain/Verbal Responses:  State content correctly [employer]) Signed: 07/22/2020 9:59:17 AM By: Rhae Hammock RN Entered By: Rhae Hammock on 07/21/2020 11:44:41 -------------------------------------------------------------------------------- Wound Assessment Details Patient Name: Date of Service: Jonathan [name], Jonathan [name] 07/21/2020 11:00 A M Medical Record Number: [id number] Patient Account Number: [id number] Date of Birth/Sex: Treating RN: 1950-10-17 (69 y.o. [name] Primary Care Jadamarie Butson: Marton Redwood Other Clinician: Referring Tyquavious Gamel: Treating Cornel Werber/Extender: Estelle Grumbles in Treatment: 24 Wound Status Wound Number: 1 Primary Diabetic Wound/Ulcer of the Lower Extremity Etiology: Wound Location: Right, Lateral Foot Wound Open Wounding Event: Gradually Appeared Status: Date Acquired: 12/26/2019 Comorbid Cataracts, Congestive Heart Failure, Hypertension, Myocardial Weeks Of Treatment: 24 History: Infarction, Hepatitis C, Type II Diabetes, End Stage Renal Disease, Clustered Wound: No Dementia, Neuropathy Wound Measurements Length: (cm) 0.7 Width: (cm) 0.9 Depth: (cm) 0.7 Area: (cm) 0.495 Volume: (cm) 0.346 % Reduction in Area: -215.3% % Reduction in Volume: -449.2% Epithelialization: None Undermining: Yes Starting Position (o'clock): 12 Ending Position (o'clock): 12 Maximum Distance: (cm) 0.8 Wound Description Classification: Grade 2 Wound Margin: Well defined, not attached Exudate Amount: Small Exudate Type: Serosanguineous Exudate Color: red, brown Foul Odor After Cleansing: No Slough/Fibrino Yes Wound Bed Granulation Amount: Large (67-100%) Exposed Structure Granulation Quality: Red Fascia Exposed: No Necrotic Amount: Small (1-33%) Fat Layer (Subcutaneous Tissue) Exposed: Yes Necrotic Quality: Adherent Slough Tendon Exposed: No Muscle Exposed: No Joint Exposed: No Bone Exposed: Yes Treatment Notes Wound #1 (Foot) Wound  Laterality: Right, Lateral Cleanser Wound Cleanser Discharge Instruction: Cleanse the wound with wound cleanser prior to applying a clean dressing using gauze sponges, not tissue or cotton balls. Peri-Wound Care Skin Prep Discharge Instruction: Use skin prep as directed Topical Primary Dressing KerraCel Ag Gelling Fiber Dressing, 4x5 in (silver alginate) Discharge Instruction: Apply silver alginate to wound bed as instructed Secondary Dressing ComfortFoam Border, 3x3 in (silicone border) Discharge Instruction: Apply over primary dressing as directed. Secured With Compression Wrap Compression Stockings Environmental education officer) Signed: 07/21/2020 4:55:24 PM By: Baruch Gouty RN, BSN Entered By: Baruch Gouty on 07/21/2020 11:12:39 -------------------------------------------------------------------------------- Vitals Details Patient Name: Date of Service: CEJAY, CAMBRE [name] 07/21/2020 11:00 A M Medical Record Number: [id number] Patient Account Number: [id number] Date of Birth/Sex: Treating RN: Mar 21, 1951 (69 y.o. [name] Primary Care Randale Carvalho: Marton Redwood Other Clinician: Referring Jake Goodson: Treating Dastan Krider/Extender: Estelle Grumbles in Treatment: 24 Vital Signs Time Taken: 11:04 Temperature (F): 98.8 Height (in): 71 Pulse (bpm): 88 Source: Stated Respiratory Rate (breaths/min): 18 Weight (lbs): 145 Blood Pressure (mmHg): 131/77 Source: Stated Reference Range: 80 - 120 mg / dl Body Mass Index (BMI): 20.2 Electronic Signature(s) Signed: 07/21/2020 4:55:24 PM By: Baruch Gouty RN, BSN Entered By: Baruch Gouty on 07/21/2020 11:04:59

## 2020-07-22 NOTE — Progress Notes (Signed)
AMATO, SEVILLANO ([id number]) Visit Report for 07/21/2020 HPI Details Patient Name: Date of Service: Jonathan [name], Jonathan [name] 07/21/2020 11:00 [place] Record Number: [id number] Patient Account Number: [id number] Date of Birth/Sex: Treating RN: 1951/01/12 (69 y.o. [name]) Carlene Coria Primary Care Provider: Marton Redwood Other Clinician: Referring Provider: Treating Provider/Extender: Estelle Grumbles in Treatment: 24 History of Present Illness HPI Description: ADMISSION 01/30/2020 This is a 69 year old man who was hospitalized with Covid for a month in January 2021. Subsequently he was discharged to [facility] skilled facility for rehabilitation. His wife states that when he got home he had areas on the right lateral heel, right lateral fifth metatarsal head. They have been using Santyl to the right lateral heel no dressing on the right lateral foot. Our intake nurse discovered in small area on the right lateral great toe. He is minimally ambulatory at this point. He does not have a known history of PAD although he is a diabetic. 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 Still a small open area on the medial ffirth metatarsal head. But this is also better. He has not heard anything about his arterial studies that we ordered 7/13; patient's review with vein and vascular/noninvasive testing is not till 8/3. Still an open area on the right lateral first met head. The area on the right lateral heel remains  closed although it is callused 7/27; arterial studies on 8 3. Still has the open area on the right fifth metatarsal head. We have been using Hydrofera Blue 8/10-Comes a 2 weeks, had his vascular studies done, ABIs on both sides are reasonably good, TBI on the right is 145 Continuing with Hydrofera Blue, the wound is slightly larger than last time. 8/31; months since I last saw this man. We have been using Hydrofera Blue to the small remaining area on the right fifth metatarsal head. He did have arterial studies done on 8/3 and while these were not terrible they certainly were normal. He is noncompressible on the right but with triphasic waveforms and a TBI of 0.96 on the left he is noncompressible with monophasic waveforms at the PTA triphasic at the dorsalis pedis with a TBI of 0.59 9/14 this patient still has a punched-out area on the right fifth metatarsal head lateral aspect. This is very painful. I looked back on his arterial studies his ABIs were noncompressible but with triphasic waveforms and a TBI of 0.96. We have been using Iodoflex 9/27; this patient has a punched-out wound on the right fifth metatarsal head lateral aspect. Very painful. He is a minimal ambulator and very difficult to pick up on claudication. I have once again reviewed his arterial studies which were really not all that impressive. He is noncompressible on the right but he had triphasic waveforms at the PTA and DP and a great toe pressure of 145 with a TBI of 0.96 we have been using Iodoflex without any improvement 10/5; x-ray from last week suggested osteomyelitis showing lateral demineralization in the fifth metatarsal head. He will need a noncontrasted MRI secondary to severe  chronic renal failure last creatinine I see is 4.921 09/22/2019. We have been using Iodoflex 10/19; still using Iodoflex. Comes in today with a post debridement improvement in the wound bed. I removed skin and subcutaneous tissues some  denuded tendon. The wound is small punched out with some undermining but generally a lot better surface than 2 weeks ago. I have him on Augmentin due to a plain x-ray that suggested osteomyelitis. His noncontrasted MRI is on 4/26. The PCR culture that I did last time [not bone] group C strep as well as Peptostreptococcus. I have him on Augmentin 500/125 1 p.o. every 24 after dialysis on dialysis days 11/4; the patient is still using Iodoflex. His MRI shows osteomyelitis of the proximal phalanx of the right fifth toe as well as the head and neck of the fifth metatarsal. I have him on Augmentin not making much progress. I suspect he is going to require an ID consultation. He is on dialysis 11/9; I stopped his Augmentin last week. T oday I used rongeurs in order to get a small piece of bone for culture and pathology. We have been using Iodoflex to the wound surface 11/16; the specimen I bone I obtained last time was negative for acute osteomyelitis. Culture showed rare Staphylococcus Caprae presumably coag negative I am not sure of the significance of this. He had a previous MRI that suggested osteomyelitis of the proximal fifth toe and the fifth met head as well as the neck of the fifth metatarsal. He went to see infectious disease and was seen by Dr. Gale Journey. He did not feel that the patient had osteomyelitis or soft tissue infection he did not feel that this represented an infected wound at all. He did not feel antibiotics were indicated imaging suggested of a bone the infection will need to be confirmed with bone biopsy and culture which I did as noted they did not confirm osteomyelitis or a true pathogen. Nevertheless to all of this the patient still has a very. Painful periwound and a necrotic nonviable wound surface with exposed bone 11/30; absolutely no improvement here. This is a deeper undermining wound almost all exposed bone. I have never seen any viable tissue here. We have been using silver  alginate. He has vascular evaluation on 12/14. Fortunately he is not complaining of a lot of pain I have repeated the PCR culture today. I did 1 in October and treated him with a course of Augmentin this did not help 12/14; PCR with bone scraping last time showed no growth. He did see Dr. Stanford Breed of vein and vascular. And scheduled for an angiogram on 08/05/2020 he is not complaining of any pain. He has no complaints of pain. Using silver alginate to the Electronic Signature(s) Signed: 07/21/2020 5:21:51 PM By: Linton Ham MD Entered By: Linton Ham on 07/21/2020 12:46:11 -------------------------------------------------------------------------------- Physical Exam Details Patient Name: Date of Service: PRANAV, LINCE [name] 07/21/2020 11:00 A M Medical Record Number: [id number] Patient Account Number: [id number] Date of Birth/Sex: Treating RN: 01-11-1951 (69 y.o. [name] Primary Care Provider: Marton Redwood Other Clinician: Referring Provider: Treating Provider/Extender: Estelle Grumbles in Treatment: 24 Constitutional Sitting or standing Blood Pressure is within target range for patient.. Pulse regular and within target range for patient.Marland Kitchen Respirations regular, non-labored and within target range.. Temperature is normal and within the target range for the patient.Marland Kitchen Appears in no distress. Cardiovascular Faint dorsalis pedis and posterior tibial pulses are still palpable. Notes Wound exam; right lateral fifth metatarsal head  straight down to bone there is undermining superiorly no debridement. No evidence of infection Electronic Signature(s) Signed: 07/21/2020 5:21:51 PM By: Linton Ham MD Entered By: Linton Ham on 07/21/2020 12:47:46 -------------------------------------------------------------------------------- Physician Orders Details Patient Name: Date of Service: AVIK, LEONI [name] 07/21/2020 11:00 A M Medical Record Number: [id number] Patient  Account Number: [id number] Date of Birth/Sex: Treating RN: 1951-06-09 (69 y.o. Erie Noe Primary Care Provider: Marton Redwood Other Clinician: Referring Provider: Treating Provider/Extender: Estelle Grumbles in Treatment: 24 Verbal / Phone Orders: No Diagnosis Coding Follow-up Appointments Return appointment in 3 weeks. Bathing/ Shower/ Hygiene May shower with protection but do not get wound dressing(s) wet. Edema Control - Lymphedema / SCD / Other Elevate legs to the level of the heart or above for 30 minutes daily and/or when sitting, a frequency of: Avoid standing for long periods of time. Off-Loading Other: - float right heel with pillows to offload pressure to heel while resting in chair and bed. no pressure to right foot. Wound Treatment Wound #1 - Foot Wound Laterality: Right, Lateral Cleanser: Wound Cleanser Every Other Day/30 Days Discharge Instructions: Cleanse the wound with wound cleanser prior to applying a clean dressing using gauze sponges, not tissue or cotton balls. Peri-Wound Care: Skin Prep Every Other Day/30 Days Discharge Instructions: Use skin prep as directed Prim Dressing: KerraCel Ag Gelling Fiber Dressing, 4x5 in (silver alginate) Every Other Day/30 Days ary Discharge Instructions: Apply silver alginate to wound bed as instructed Secondary Dressing: ComfortFoam Border, 3x3 in (silicone border) Every Other Day/30 Days Discharge Instructions: Apply over primary dressing as directed. Electronic Signature(s) Signed: 07/21/2020 5:21:51 PM By: Linton Ham MD Signed: 07/22/2020 9:59:17 AM By: Rhae Hammock RN Entered By: Rhae Hammock on 07/21/2020 11:44:18 -------------------------------------------------------------------------------- Problem List Details Patient Name: Date of Service: CALDEN, DORSEY [name] 07/21/2020 11:00 [place] Record Number: [id number] Patient Account Number: [id number] Date of Birth/Sex: Treating  RN: Jun 12, 1951 (69 y.o. [name]) Carlene Coria Primary Care Provider: Marton Redwood Other Clinician: Referring Provider: Treating Provider/Extender: Estelle Grumbles in Treatment: 24 Active Problems ICD-10 Encounter Code Description Active Date MDM Diagnosis E11.621 Type 2 diabetes mellitus with foot ulcer 01/30/2020 No Yes E11.51 Type 2 diabetes mellitus with diabetic peripheral angiopathy without gangrene 01/30/2020 No Yes L97.514 Non-pressure chronic ulcer of other part of right foot with necrosis of bone 07/07/2020 No Yes Inactive Problems ICD-10 Code Description Active Date Inactive Date L97.512 Non-pressure chronic ulcer of other part of right foot with fat layer exposed 01/30/2020 01/30/2020 L97.411 Non-pressure chronic ulcer of right heel and midfoot limited to breakdown of skin 01/30/2020 01/30/2020 Resolved Problems Electronic Signature(s) Signed: 07/21/2020 5:21:51 PM By: Linton Ham MD Entered By: Linton Ham on 07/21/2020 12:43:14 -------------------------------------------------------------------------------- Progress Note Details Patient Name: Date of Service: DAMIER, DISANO [name] 07/21/2020 11:00 [place] Record Number: [id number] Patient Account Number: [id number] Date of Birth/Sex: Treating RN: September 07, 1950 (69 y.o. [name] Primary Care Provider: Marton Redwood Other Clinician: Referring Provider: Treating Provider/Extender: Estelle Grumbles in Treatment: 24 Subjective History of Present Illness (HPI) ADMISSION 01/30/2020 This is a 69 year old man who was hospitalized with Covid for a month in January 2021. Subsequently he was discharged to [facility] skilled facility for rehabilitation. His wife states that when he got home he had areas on the right lateral heel, right lateral fifth metatarsal head. They have been using Santyl to the right lateral heel no dressing on the right lateral foot. Our intake nurse discovered in  small area on  the right lateral great toe. He is minimally ambulatory at this point. He does not have a known history of PAD although he is a diabetic. 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 Still a small open area on the medial ffirth metatarsal head. But this is also better. He has not heard anything about his arterial studies that we ordered 7/13; patient's review with vein and vascular/noninvasive testing is not till 8/3. Still an open area on the right lateral first met head. The area on the right lateral heel remains closed although it is callused 7/27; arterial studies on 8 3. Still has the open area on the right fifth metatarsal head. We have been using Hydrofera Blue 8/10-Comes a 2 weeks, had his vascular studies done, ABIs on both sides are reasonably good, TBI on the right is 145 Continuing with Hydrofera Blue, the wound is slightly larger than last time. 8/31; months since I last saw this man. We have been using Hydrofera Blue to the small remaining area on the right fifth metatarsal head. He did have arterial studies done on 8/3 and while these were not terrible they certainly were normal. He is noncompressible on the right but with triphasic waveforms and a TBI of 0.96 on the left he is noncompressible with monophasic waveforms at the PTA triphasic at the dorsalis pedis with a TBI of 0.59 9/14 this patient still has a punched-out area on the right fifth metatarsal head lateral aspect. This is very  painful. I looked back on his arterial studies his ABIs were noncompressible but with triphasic waveforms and a TBI of 0.96. We have been using Iodoflex 9/27; this patient has a punched-out wound on the right fifth metatarsal head lateral aspect. Very painful. He is a minimal ambulator and very difficult to pick up on claudication. I have once again reviewed his arterial studies which were really not all that impressive. He is noncompressible on the right but he had triphasic waveforms at the PTA and DP and a great toe pressure of 145 with a TBI of 0.96 we have been using Iodoflex without any improvement 10/5; x-ray from last week suggested osteomyelitis showing lateral demineralization in the fifth metatarsal head. He will need a noncontrasted MRI secondary to severe chronic renal failure last creatinine I see is 4.921 09/22/2019. We have been using Iodoflex 10/19; still using Iodoflex. Comes in today with a post debridement improvement in the wound bed. I removed skin and subcutaneous tissues some denuded tendon. The wound is small punched out with some undermining but generally a lot better surface than 2 weeks ago. I have him on Augmentin due to a plain x-ray that suggested osteomyelitis. His noncontrasted MRI is on 4/26. The PCR culture that I did last time [not bone] group C strep as well as Peptostreptococcus. I have him on Augmentin 500/125 1 p.o. every 24 after dialysis on dialysis days 11/4; the patient is still using Iodoflex. His MRI shows osteomyelitis of the proximal phalanx of the right fifth toe as well as the head and neck of the  fifth metatarsal. I have him on Augmentin not making much progress. I suspect he is going to require an ID consultation. He is on dialysis 11/9; I stopped his Augmentin last week. T oday I used rongeurs in order to get a small piece of bone for culture and pathology. We have been using Iodoflex to the wound surface 11/16; the specimen I bone I obtained last  time was negative for acute osteomyelitis. Culture showed rare Staphylococcus Caprae presumably coag negative I am not sure of the significance of this. He had a previous MRI that suggested osteomyelitis of the proximal fifth toe and the fifth met head as well as the neck of the fifth metatarsal. He went to see infectious disease and was seen by Dr. Gale Journey. He did not feel that the patient had osteomyelitis or soft tissue infection he did not feel that this represented an infected wound at all. He did not feel antibiotics were indicated imaging suggested of a bone the infection will need to be confirmed with bone biopsy and culture which I did as noted they did not confirm osteomyelitis or a true pathogen. Nevertheless to all of this the patient still has a very. Painful periwound and a necrotic nonviable wound surface with exposed bone 11/30; absolutely no improvement here. This is a deeper undermining wound almost all exposed bone. I have never seen any viable tissue here. We have been using silver alginate. He has vascular evaluation on 12/14. Fortunately he is not complaining of a lot of pain I have repeated the PCR culture today. I did 1 in October and treated him with a course of Augmentin this did not help 12/14; PCR with bone scraping last time showed no growth. He did see Dr. Stanford Breed of vein and vascular. And scheduled for an angiogram on 08/05/2020 he is not complaining of any pain. He has no complaints of pain. Using silver alginate to the Objective Constitutional Sitting or standing Blood Pressure is within target range for patient.. Pulse regular and within target range for patient.Marland Kitchen Respirations regular, non-labored and within target range.. Temperature is normal and within the target range for the patient.Marland Kitchen Appears in no distress. Vitals Time Taken: 11:04 AM, Height: 71 in, Source: Stated, Weight: 145 lbs, Source: Stated, BMI: 20.2, Temperature: 98.8 F, Pulse: 88 bpm, Respiratory Rate: 18  breaths/min, Blood Pressure: 131/77 mmHg. Cardiovascular Faint dorsalis pedis and posterior tibial pulses are still palpable. General Notes: Wound exam; right lateral fifth metatarsal head straight down to bone there is undermining superiorly no debridement. No evidence of infection Integumentary (Hair, Skin) Wound #1 status is Open. Original cause of wound was Gradually Appeared. The wound is located on the Right,Lateral Foot. The wound measures 0.7cm length x 0.9cm width x 0.7cm depth; 0.495cm^2 area and 0.346cm^3 volume. There is bone and Fat Layer (Subcutaneous Tissue) exposed. There is undermining starting at 12:00 and ending at 12:00 with a maximum distance of 0.8cm. There is a small amount of serosanguineous drainage noted. The wound margin is well defined and not attached to the wound base. There is large (67-100%) red granulation within the wound bed. There is a small (1-33%) amount of necrotic tissue within the wound bed including Adherent Slough. Assessment Active Problems ICD-10 Type 2 diabetes mellitus with foot ulcer Type 2 diabetes mellitus with diabetic peripheral angiopathy without gangrene Non-pressure chronic ulcer of other part of right foot with necrosis of bone Plan Follow-up Appointments: Return appointment in 3 weeks. Bathing/ Shower/ Hygiene: May shower with protection but do  not get wound dressing(s) wet. Edema Control - Lymphedema / SCD / Other: Elevate legs to the level of the heart or above for 30 minutes daily and/or when sitting, a frequency of: Avoid standing for long periods of time. Off-Loading: Other: - float right heel with pillows to offload pressure to heel while resting in chair and bed. no pressure to right foot. WOUND #1: - Foot Wound Laterality: Right, Lateral Cleanser: Wound Cleanser Every Other Day/30 Days Discharge Instructions: Cleanse the wound with wound cleanser prior to applying a clean dressing using gauze sponges, not tissue or cotton  balls. Peri-Wound Care: Skin Prep Every Other Day/30 Days Discharge Instructions: Use skin prep as directed Prim Dressing: KerraCel Ag Gelling Fiber Dressing, 4x5 in (silver alginate) Every Other Day/30 Days ary Discharge Instructions: Apply silver alginate to wound bed as instructed Secondary Dressing: ComfortFoam Border, 3x3 in (silicone border) Every Other Day/30 Days Discharge Instructions: Apply over primary dressing as directed. 1. Angiogram on 12/29. This took [place] some time to arrange 2. Unfortunately I agree with vein and vascular comments likely he will need a ray amputation if his blood flow will allow. Otherwise he may be looking at a below-knee amputation. 3. Continue with silver alginate which is a good palliative dressing in the setting Electronic Signature(s) Signed: 07/21/2020 5:21:51 PM By: Linton Ham MD Entered By: Linton Ham on 07/21/2020 12:48:36 -------------------------------------------------------------------------------- SuperBill Details Patient Name: Date of Service: RUBIN, DAIS [name] 07/21/2020 Medical Record Number: [id number] Patient Account Number: [id number] Date of Birth/Sex: Treating RN: [birth year] (69 y.o. Burnadette Pop, Lauren Primary Care Provider: Marton Redwood Other Clinician: Referring Provider: Treating Provider/Extender: Estelle Grumbles in Treatment: 24 Diagnosis Coding ICD-10 Codes Code Description [phone] Type 2 diabetes mellitus with foot ulcer E11.51 Type 2 diabetes mellitus with diabetic peripheral angiopathy without gangrene L97.514 Non-pressure chronic ulcer of other part of right foot with necrosis of bone Facility Procedures CPT4 Code: [id number] Description: 56 - WOUND CARE VISIT-LEV 3 EST PT Modifier: 1 Quantity: Physician Procedures : CPT4 Code Description Modifier [id number] - WC PHYS LEVEL 3 - EST PT ICD-10 Diagnosis Description L97.514 Non-pressure chronic ulcer of other part of right foot  with necrosis of bone E11.51 Type 2 diabetes mellitus with diabetic peripheral  angiopathy without gangrene Quantity: 1 Electronic Signature(s) Signed: 07/21/2020 5:21:51 PM By: Linton Ham MD Entered By: Linton Ham on 07/21/2020 12:49:01

## 2020-07-27 DIAGNOSIS — Z4802 Encounter for removal of sutures: Secondary | ICD-10-CM | POA: Insufficient documentation

## 2020-08-04 ENCOUNTER — Telehealth: Payer: Self-pay

## 2020-08-04 ENCOUNTER — Other Ambulatory Visit (HOSPITAL_COMMUNITY)
Admission: RE | Admit: 2020-08-04 | Discharge: 2020-08-04 | Disposition: A | Discharge status: Home / Self Care | Payer: Medicare Other | Source: Ambulatory Visit | Attending: Vascular Surgery | Admitting: Vascular Surgery

## 2020-08-04 DIAGNOSIS — Z20822 Contact with and (suspected) exposure to covid-19: Secondary | ICD-10-CM | POA: Insufficient documentation

## 2020-08-04 DIAGNOSIS — Z01812 Encounter for preprocedural laboratory examination: Secondary | ICD-10-CM | POA: Insufficient documentation

## 2020-08-04 NOTE — Telephone Encounter (Signed)
Pt's wife called to update Dr. Stanford Breed that pt does have two stents placed within his right arm dialysis access, with the last being placed on 07/23/20 by [facility] and previous on last year.

## 2020-08-05 ENCOUNTER — Inpatient Hospital Stay (HOSPITAL_COMMUNITY)
Admission: RE | Admit: 2020-08-05 | Discharge: 2020-08-10 | DRG: 270 | Disposition: A | Discharge status: Home Health | Payer: Medicare Other | Attending: Vascular Surgery | Admitting: Vascular Surgery

## 2020-08-05 ENCOUNTER — Encounter (HOSPITAL_COMMUNITY)
Admission: RE | Disposition: A | Discharge status: Home Health | Payer: Self-pay | Source: Home / Self Care | Attending: Vascular Surgery

## 2020-08-05 ENCOUNTER — Other Ambulatory Visit: Payer: Self-pay

## 2020-08-05 DIAGNOSIS — Z8673 Personal history of transient ischemic attack (TIA), and cerebral infarction without residual deficits: Secondary | ICD-10-CM | POA: Diagnosis not present

## 2020-08-05 DIAGNOSIS — E1151 Type 2 diabetes mellitus with diabetic peripheral angiopathy without gangrene: Secondary | ICD-10-CM | POA: Diagnosis present

## 2020-08-05 DIAGNOSIS — Z8616 Personal history of COVID-19: Secondary | ICD-10-CM | POA: Diagnosis not present

## 2020-08-05 DIAGNOSIS — E1169 Type 2 diabetes mellitus with other specified complication: Secondary | ICD-10-CM | POA: Diagnosis present

## 2020-08-05 DIAGNOSIS — N2581 Secondary hyperparathyroidism of renal origin: Secondary | ICD-10-CM | POA: Diagnosis present

## 2020-08-05 DIAGNOSIS — E11621 Type 2 diabetes mellitus with foot ulcer: Secondary | ICD-10-CM | POA: Diagnosis present

## 2020-08-05 DIAGNOSIS — Z992 Dependence on renal dialysis: Secondary | ICD-10-CM

## 2020-08-05 DIAGNOSIS — M869 Osteomyelitis, unspecified: Secondary | ICD-10-CM | POA: Diagnosis present

## 2020-08-05 DIAGNOSIS — Y838 Other surgical procedures as the cause of abnormal reaction of the patient, or of later complication, without mention of misadventure at the time of the procedure: Secondary | ICD-10-CM | POA: Diagnosis not present

## 2020-08-05 DIAGNOSIS — I70235 Atherosclerosis of native arteries of right leg with ulceration of other part of foot: Secondary | ICD-10-CM | POA: Diagnosis present

## 2020-08-05 DIAGNOSIS — F0150 Vascular dementia without behavioral disturbance: Secondary | ICD-10-CM | POA: Diagnosis present

## 2020-08-05 DIAGNOSIS — I120 Hypertensive chronic kidney disease with stage 5 chronic kidney disease or end stage renal disease: Secondary | ICD-10-CM | POA: Diagnosis present

## 2020-08-05 DIAGNOSIS — S301XXA Contusion of abdominal wall, initial encounter: Secondary | ICD-10-CM | POA: Diagnosis not present

## 2020-08-05 DIAGNOSIS — Z87891 Personal history of nicotine dependence: Secondary | ICD-10-CM

## 2020-08-05 DIAGNOSIS — L97519 Non-pressure chronic ulcer of other part of right foot with unspecified severity: Secondary | ICD-10-CM | POA: Diagnosis present

## 2020-08-05 DIAGNOSIS — Z8701 Personal history of pneumonia (recurrent): Secondary | ICD-10-CM

## 2020-08-05 DIAGNOSIS — Z20822 Contact with and (suspected) exposure to covid-19: Secondary | ICD-10-CM | POA: Diagnosis present

## 2020-08-05 DIAGNOSIS — E1122 Type 2 diabetes mellitus with diabetic chronic kidney disease: Secondary | ICD-10-CM | POA: Diagnosis present

## 2020-08-05 DIAGNOSIS — Z7952 Long term (current) use of systemic steroids: Secondary | ICD-10-CM

## 2020-08-05 DIAGNOSIS — Z7901 Long term (current) use of anticoagulants: Secondary | ICD-10-CM | POA: Diagnosis not present

## 2020-08-05 DIAGNOSIS — Z794 Long term (current) use of insulin: Secondary | ICD-10-CM

## 2020-08-05 DIAGNOSIS — I70221 Atherosclerosis of native arteries of extremities with rest pain, right leg: Secondary | ICD-10-CM | POA: Diagnosis present

## 2020-08-05 DIAGNOSIS — Z833 Family history of diabetes mellitus: Secondary | ICD-10-CM | POA: Diagnosis not present

## 2020-08-05 DIAGNOSIS — D62 Acute posthemorrhagic anemia: Secondary | ICD-10-CM | POA: Diagnosis not present

## 2020-08-05 DIAGNOSIS — I70202 Unspecified atherosclerosis of native arteries of extremities, left leg: Secondary | ICD-10-CM | POA: Diagnosis present

## 2020-08-05 DIAGNOSIS — T8612 Kidney transplant failure: Secondary | ICD-10-CM | POA: Diagnosis present

## 2020-08-05 DIAGNOSIS — Y830 Surgical operation with transplant of whole organ as the cause of abnormal reaction of the patient, or of later complication, without mention of misadventure at the time of the procedure: Secondary | ICD-10-CM | POA: Diagnosis present

## 2020-08-05 DIAGNOSIS — I7025 Atherosclerosis of native arteries of other extremities with ulceration: Secondary | ICD-10-CM | POA: Diagnosis present

## 2020-08-05 DIAGNOSIS — Z79899 Other long term (current) drug therapy: Secondary | ICD-10-CM

## 2020-08-05 DIAGNOSIS — Z9889 Other specified postprocedural states: Secondary | ICD-10-CM | POA: Diagnosis not present

## 2020-08-05 DIAGNOSIS — Z7982 Long term (current) use of aspirin: Secondary | ICD-10-CM

## 2020-08-05 DIAGNOSIS — N186 End stage renal disease: Secondary | ICD-10-CM | POA: Diagnosis present

## 2020-08-05 HISTORY — PX: LOWER EXTREMITY ANGIOGRAPHY: CATH118251

## 2020-08-05 HISTORY — PX: ABDOMINAL AORTOGRAM: CATH118222

## 2020-08-05 HISTORY — PX: PERIPHERAL VASCULAR INTERVENTION: CATH118257

## 2020-08-05 LAB — POCT I-STAT, CHEM 8
BUN: 109 mg/dL — ABNORMAL HIGH (ref 8–23)
Calcium, Ion: 1.11 mmol/L — ABNORMAL LOW (ref 1.15–1.40)
Chloride: 105 mmol/L (ref 98–111)
Creatinine, Ser: 12.1 mg/dL — ABNORMAL HIGH (ref 0.61–1.24)
Glucose, Bld: 126 mg/dL — ABNORMAL HIGH (ref 70–99)
HCT: 34 % — ABNORMAL LOW (ref 39.0–52.0)
Hemoglobin: 11.6 g/dL — ABNORMAL LOW (ref 13.0–17.0)
Potassium: 5.2 mmol/L — ABNORMAL HIGH (ref 3.5–5.1)
Sodium: 139 mmol/L (ref 135–145)
TCO2: 24 mmol/L (ref 22–32)

## 2020-08-05 LAB — HEMOGLOBIN A1C
Hgb A1c MFr Bld: 5.1 % (ref 4.8–5.6)
Mean Plasma Glucose: 99.67 mg/dL

## 2020-08-05 LAB — POCT ACTIVATED CLOTTING TIME: Activated Clotting Time: 214 seconds

## 2020-08-05 LAB — PROTIME-INR
INR: 1.1 (ref 0.8–1.2)
Prothrombin Time: 13.7 seconds (ref 11.4–15.2)

## 2020-08-05 LAB — SARS CORONAVIRUS 2 (TAT 6-24 HRS): SARS Coronavirus 2: NEGATIVE

## 2020-08-05 LAB — GLUCOSE, CAPILLARY
Glucose-Capillary: 78 mg/dL (ref 70–99)
Glucose-Capillary: 166 mg/dL — ABNORMAL HIGH (ref 70–99)
Glucose-Capillary: 129 mg/dL — ABNORMAL HIGH (ref 70–99)
Glucose-Capillary: 102 mg/dL — ABNORMAL HIGH (ref 70–99)

## 2020-08-05 SURGERY — LOWER EXTREMITY ANGIOGRAPHY
Anesthesia: LOCAL | Laterality: Right

## 2020-08-05 MED ORDER — HEPARIN (PORCINE) IN NACL 1000-0.9 UT/500ML-% IV SOLN
INTRAVENOUS | Status: AC
  Filled 2020-08-05: qty 1000

## 2020-08-05 MED ORDER — CLOPIDOGREL BISULFATE 75 MG PO TABS
300.0000 mg | ORAL_TABLET | Freq: Once | ORAL | Status: AC
  Administered 2020-08-05: 300 mg via ORAL

## 2020-08-05 MED ORDER — MIDAZOLAM HCL 2 MG/2ML IJ SOLN
INTRAMUSCULAR | Status: AC
  Filled 2020-08-05: qty 2

## 2020-08-05 MED ORDER — SODIUM CHLORIDE 0.9% FLUSH: 3.0000 mL | Freq: Two times a day (BID) | INTRAVENOUS | Status: DC

## 2020-08-05 MED ORDER — HEPARIN (PORCINE) IN NACL 1000-0.9 UT/500ML-% IV SOLN
INTRAVENOUS | Status: DC | PRN
  Administered 2020-08-05 (×2): 500 mL

## 2020-08-05 MED ORDER — PREDNISONE 5 MG PO TABS
5.0000 mg | ORAL_TABLET | Freq: Every day | ORAL | Status: DC
  Administered 2020-08-06 – 2020-08-10 (×5): 5 mg via ORAL
  Filled 2020-08-05 (×5): qty 1

## 2020-08-05 MED ORDER — LIDOCAINE HCL (PF) 1 % IJ SOLN
INTRAMUSCULAR | Status: AC
  Filled 2020-08-05: qty 30

## 2020-08-05 MED ORDER — SODIUM CHLORIDE 0.9 % IV SOLN: 250.0000 mL | INTRAVENOUS | Status: DC | PRN

## 2020-08-05 MED ORDER — HYDRALAZINE HCL 20 MG/ML IJ SOLN: 5.0000 mg | INTRAMUSCULAR | Status: DC | PRN

## 2020-08-05 MED ORDER — SODIUM CHLORIDE 0.9 % IV SOLN
250.0000 mL | INTRAVENOUS | Status: DC | PRN
  Administered 2020-08-05: 250 mL via INTRAVENOUS

## 2020-08-05 MED ORDER — SODIUM CHLORIDE 0.9% FLUSH: 3.0000 mL | INTRAVENOUS | Status: DC | PRN

## 2020-08-05 MED ORDER — SENNOSIDES-DOCUSATE SODIUM 8.6-50 MG PO TABS
1.0000 | ORAL_TABLET | Freq: Every day | ORAL | Status: DC
  Administered 2020-08-07 – 2020-08-10 (×4): 1 via ORAL
  Filled 2020-08-05 (×5): qty 1

## 2020-08-05 MED ORDER — FENTANYL CITRATE (PF) 100 MCG/2ML IJ SOLN
INTRAMUSCULAR | Status: DC | PRN
  Administered 2020-08-05: 25 ug via INTRAVENOUS

## 2020-08-05 MED ORDER — BOOST HIGH PROTEIN PO LIQD
1.0000 | Freq: Every day | ORAL | Status: DC | PRN
  Filled 2020-08-05: qty 237

## 2020-08-05 MED ORDER — SODIUM CHLORIDE 0.9% FLUSH
3.0000 mL | Freq: Two times a day (BID) | INTRAVENOUS | Status: DC
  Administered 2020-08-06 – 2020-08-10 (×9): 3 mL via INTRAVENOUS

## 2020-08-05 MED ORDER — MIDAZOLAM HCL 2 MG/2ML IJ SOLN
INTRAMUSCULAR | Status: DC | PRN
  Administered 2020-08-05: 1 mg via INTRAVENOUS

## 2020-08-05 MED ORDER — PENTAFLUOROPROP-TETRAFLUOROETH EX AERO: 1.0000 "application " | INHALATION_SPRAY | CUTANEOUS | Status: DC | PRN

## 2020-08-05 MED ORDER — ALTEPLASE 2 MG IJ SOLR: 2.0000 mg | Freq: Once | INTRAMUSCULAR | Status: DC | PRN

## 2020-08-05 MED ORDER — FENTANYL CITRATE (PF) 100 MCG/2ML IJ SOLN
INTRAMUSCULAR | Status: AC
  Filled 2020-08-05: qty 2

## 2020-08-05 MED ORDER — LIDOCAINE HCL (PF) 1 % IJ SOLN
INTRAMUSCULAR | Status: DC | PRN
  Administered 2020-08-05: 15 mL

## 2020-08-05 MED ORDER — ACETAMINOPHEN 325 MG PO TABS: 650.0000 mg | ORAL_TABLET | ORAL | Status: DC | PRN

## 2020-08-05 MED ORDER — ASPIRIN 81 MG PO CHEW
81.0000 mg | CHEWABLE_TABLET | Freq: Every day | ORAL | Status: DC
  Administered 2020-08-07 – 2020-08-10 (×4): 81 mg via ORAL
  Filled 2020-08-05 (×4): qty 1

## 2020-08-05 MED ORDER — LIDOCAINE-PRILOCAINE 2.5-2.5 % EX CREA: 1.0000 "application " | TOPICAL_CREAM | CUTANEOUS | Status: DC

## 2020-08-05 MED ORDER — LABETALOL HCL 5 MG/ML IV SOLN
INTRAVENOUS | Status: AC
  Filled 2020-08-05: qty 4

## 2020-08-05 MED ORDER — HEPARIN SODIUM (PORCINE) 1000 UNIT/ML IJ SOLN
INTRAMUSCULAR | Status: AC
  Filled 2020-08-05: qty 1

## 2020-08-05 MED ORDER — CLOPIDOGREL BISULFATE 75 MG PO TABS
75.0000 mg | ORAL_TABLET | Freq: Every day | ORAL | Status: DC
  Administered 2020-08-07 – 2020-08-10 (×4): 75 mg via ORAL
  Filled 2020-08-05 (×4): qty 1

## 2020-08-05 MED ORDER — SODIUM CHLORIDE 0.9 % IV SOLN: 100.0000 mL | INTRAVENOUS | Status: DC | PRN

## 2020-08-05 MED ORDER — CLOPIDOGREL BISULFATE 300 MG PO TABS
ORAL_TABLET | ORAL | Status: AC
  Filled 2020-08-05: qty 1

## 2020-08-05 MED ORDER — LIDOCAINE-PRILOCAINE 2.5-2.5 % EX CREA
1.0000 "application " | TOPICAL_CREAM | CUTANEOUS | Status: DC | PRN
  Filled 2020-08-05: qty 5

## 2020-08-05 MED ORDER — SEVELAMER CARBONATE 800 MG PO TABS
800.0000 mg | ORAL_TABLET | Freq: Three times a day (TID) | ORAL | Status: DC
Start: 2020-08-05 — End: 2020-08-10
  Administered 2020-08-07 – 2020-08-10 (×11): 800 mg via ORAL
  Filled 2020-08-05 (×11): qty 1

## 2020-08-05 MED ORDER — METOCLOPRAMIDE HCL 5 MG PO TABS: 5.0000 mg | ORAL_TABLET | Freq: Three times a day (TID) | ORAL | Status: DC | PRN

## 2020-08-05 MED ORDER — LIDOCAINE HCL (PF) 1 % IJ SOLN: 5.0000 mL | INTRAMUSCULAR | Status: DC | PRN

## 2020-08-05 MED ORDER — ATORVASTATIN CALCIUM 40 MG PO TABS
40.0000 mg | ORAL_TABLET | Freq: Every day | ORAL | Status: DC
  Administered 2020-08-05 – 2020-08-09 (×4): 40 mg via ORAL
  Filled 2020-08-05 (×4): qty 1

## 2020-08-05 MED ORDER — INSULIN ASPART 100 UNIT/ML ~~LOC~~ SOLN
0.0000 [IU] | Freq: Three times a day (TID) | SUBCUTANEOUS | Status: DC
  Administered 2020-08-05: 1 [IU] via SUBCUTANEOUS
  Administered 2020-08-07: 2 [IU] via SUBCUTANEOUS
  Administered 2020-08-07: 3 [IU] via SUBCUTANEOUS
  Administered 2020-08-08: 9 [IU] via SUBCUTANEOUS
  Administered 2020-08-08: 1 [IU] via SUBCUTANEOUS
  Administered 2020-08-09: 3 [IU] via SUBCUTANEOUS
  Administered 2020-08-09 – 2020-08-10 (×3): 1 [IU] via SUBCUTANEOUS

## 2020-08-05 MED ORDER — HEPARIN (PORCINE) 25000 UT/250ML-% IV SOLN
1100.0000 [IU]/h | INTRAVENOUS | Status: DC
  Administered 2020-08-05: 900 [IU]/h via INTRAVENOUS
  Filled 2020-08-05: qty 250

## 2020-08-05 MED ORDER — HEPARIN SODIUM (PORCINE) 1000 UNIT/ML IJ SOLN
INTRAMUSCULAR | Status: DC | PRN
  Administered 2020-08-05: 6000 [IU] via INTRAVENOUS

## 2020-08-05 MED ORDER — IODIXANOL 320 MG/ML IV SOLN
INTRAVENOUS | Status: DC | PRN
  Administered 2020-08-05: 160 mL

## 2020-08-05 MED ORDER — ONDANSETRON HCL 4 MG/2ML IJ SOLN: 4.0000 mg | Freq: Four times a day (QID) | INTRAMUSCULAR | Status: DC | PRN

## 2020-08-05 MED ORDER — LEVALBUTEROL TARTRATE 45 MCG/ACT IN AERO: 2.0000 | INHALATION_SPRAY | Freq: Three times a day (TID) | RESPIRATORY_TRACT | Status: DC | PRN

## 2020-08-05 MED ORDER — LABETALOL HCL 5 MG/ML IV SOLN
10.0000 mg | INTRAVENOUS | Status: DC | PRN
  Administered 2020-08-05 (×3): 10 mg via INTRAVENOUS

## 2020-08-05 MED ORDER — CHLORHEXIDINE GLUCONATE CLOTH 2 % EX PADS
6.0000 | MEDICATED_PAD | Freq: Every day | CUTANEOUS | Status: DC
  Administered 2020-08-06 – 2020-08-10 (×5): 6 via TOPICAL

## 2020-08-05 MED ORDER — SERTRALINE HCL 100 MG PO TABS
100.0000 mg | ORAL_TABLET | Freq: Every day | ORAL | Status: DC
  Administered 2020-08-05 – 2020-08-09 (×5): 100 mg via ORAL
  Filled 2020-08-05 (×7): qty 1

## 2020-08-05 SURGICAL SUPPLY — 15 items
BAG SNAP BAND KOVER 36X36 (MISCELLANEOUS) ×4
BALLN MUSTANG 5X120X135 (BALLOONS) ×4
CATH OMNI FLUSH 5F 65CM (CATHETERS) ×4
CLOSURE PERCLOSE PROSTYLE (VASCULAR PRODUCTS) ×4
GLIDEWIRE ADV .035X260CM (WIRE) ×4
KIT ENCORE 26 ADVANTAGE (KITS) ×4
KIT MICROPUNCTURE NIT STIFF (SHEATH) ×4
KIT PV (KITS) ×4
SHEATH FLEX ANSEL ANG 6F 45CM (SHEATH) ×4
SHEATH PINNACLE 5F 10CM (SHEATH) ×4
SHEATH PROBE COVER 6X72 (BAG) ×4
STENT ELUVIA 6X120X130 (Permanent Stent) ×4 IMPLANT
SYR MEDRAD MARK V 150ML (SYRINGE) ×4
TRANSDUCER W/STOPCOCK (MISCELLANEOUS) ×4
TRAY PV CATH (CUSTOM PROCEDURE TRAY) ×4

## 2020-08-05 NOTE — Op Note (Signed)
DATE OF SERVICE: 08/05/2020  PATIENT:  Jonathan Alexandria Sr.  69 y.o. 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  ANESTHESIA:   local and IV sedation  EBL: min  BLOOD ADMINISTERED:none  DRAINS: none   LOCAL MEDICATIONS USED:  LIDOCAINE   SPECIMEN:  none  COUNTS: confirmed correct.  TOURNIQUET:  * No tourniquets in log *  PATIENT DISPOSITION:  PACU - hemodynamically stable.   Delay start of Pharmacological VTE agent (>24hrs) due to surgical blood loss or risk of bleeding: no  INDICATION FOR PROCEDURE: Jonathan Steel Sr. is a 69 y.o. male with right fifth toe ulcer. After careful discussion of risks, benefits, and alternatives the patient was offered angiogram with intervention. We specifically discussed access related complication. The patient understood and wished to proceed.  OPERATIVE FINDINGS:  Aorta: mild atherosclerosis without flow limiting disease Right iliac arteries: mild atherosclerosis without flow limiting disease Left iliac arteries: mild atherosclerosis without flow limiting disease Right femoral arteries: right common femoral artery with diffuse plaque; >70% stenosis; SFA with diffuse disease, greatest area 95%.  Left femoral arteries: common femoral with mild atherosclerosis without flow limiting disease; SFA with diffuse disease, greatest area 90%.  Right popliteal arteries: 50% behind-the-knee popliteal stenosis Left popliteal arteries: 60% above knee popliteal stenosis Right tibial trifurcation: AT occludes near  its origin. PT/Peroneal flow. 65% proximal PT stenosis. Left tibial trifurcation: poor opacification. PT dominant flow. Right tibial vessels: PT/Peroneal flow to the foot. Left tibial vessels: PT flow to the foot.  Will need RCFA endarterectomy and R fifth toe ray amputation 08/06/20.  DESCRIPTION OF PROCEDURE: After identification of the patient in the pre-operative holding area, the patient was transferred to the operating room. The patient was positioned supine on the operating room table. Anesthesia was induced. The groins was prepped and draped in standard fashion. A surgical pause was performed confirming correct patient, procedure, and operative location.  To begin the left groin was anesthetized with 1% lidocaine. Using ultrasound guidance, the left common femoral artery was accessed with micropuncture technique. Fluoroscopy was used to confirm cannulation over the femoral head. Sheathogram was not performed. The 62F sheath was upsized to 64F.   An 035 Britta Mccreedy wire was advanced into the distal aorta. Over the wire an omni flush catheter was advanced to the level of L2. Aortogram was performed. See above for details.   The right common iliac artery was selected with an 035 floppy glidewire advantage. The wire was advanced into the superficial femoral artery. Over the wire the omni flush catheter was advanced into the external iliac artery. Selective angiography was performed revealing the above.  The decision was made to intervene. The patient was heparinized with 6000 units of heparin. The sheath was exchanged for a 10F x 45cm sheath. Selective angiography of the left lower extremity was performed prior to intervention. The lesions were treated with a 6x151mm Eluvia stent to the critical SFA stenosis.  Completion angiography revealed resolution of SFA stenosis.  A perclose device was used to close the arteriotomy. Hemostasis was excellent upon completion.  Upon completion of the case  instrument and sharps counts were confirmed correct. The patient was transferred to the PACU in good condition. I was present for all portions of the procedure.  [name], MD [facility] Phone Number: [phone] 08/05/2020 8:54 AM

## 2020-08-05 NOTE — Consult Note (Signed)
[facility] Renal Consultation Note    Indication for Consultation:  Management of ESRD/hemodialysis, anemia, hypertension/volume, and secondary hyperparathyroidism. PCP:  HPI: Jonathan Begley Sr. is a 69 y.o. male with ESRD (Hx failed kidney transplant 2015 - 2019), T2DM, HTN, Hx CVA, gout, Hx treated HCV, and PVD with R foot ulcer/osteomyelitis who was admitted for elective RLE angiography. This was completed this afternoon with plan to stay overnight and undergo R 5th ray amputation on 12/30.  Pt seen in room. Denies CP, dyspnea, fever, chills, N/V, diarrhea, or foot pain. Looks comfortable. No recent issues with dialysis.  Reviewing OP HD records, looks like his last dialysis was on 12/21 - over 1 week ago! Pt reports that he was out of [place] last weekend for [place] and that he was dialyzed there - called his HD unit to verify and they report that he was NOT arranged to have dialysis out of town. Labs today show BUN 109, Cr 12 - consistent with missing HD. Called wife to verify - she admits they were out of town and he was not dialyzed.  Past Medical History:  Diagnosis Date  . Diabetes mellitus without complication ([place])   . FUO (fever of unknown origin) 09/17/2019  . Hypertension   . Renal disorder 2015   right kidney transplant  . Stroke [facility])    Past Surgical History:  Procedure Laterality Date  . BACK SURGERY     Has had 2 back surgeries  . BUBBLE STUDY  09/18/2019   Procedure: BUBBLE STUDY;  Surgeon: Buford Dresser, MD;  Location: [facility] ENDOSCOPY;  Service: Cardiovascular;;  . Depression    . HAND SURGERY Right   . HAND SURGERY    . IR REMOVAL TUN CV CATH W/O FL  09/03/2019  . KIDNEY TRANSPLANT     November 2015  . Memory loss    . NEPHRECTOMY TRANSPLANTED ORGAN    . TEE WITHOUT CARDIOVERSION N/A 09/18/2019   Procedure: TRANSESOPHAGEAL ECHOCARDIOGRAM (TEE);  Surgeon: Buford Dresser, MD;  Location: [facility] ENDOSCOPY;  Service:  Cardiovascular;  Laterality: N/A;   Family History  Problem Relation Age of Onset  . Diabetes Mellitus II Mother   . Diabetes Mellitus II Brother    Social History:  reports that he has quit smoking. He has never used smokeless tobacco. He reports that he does not drink alcohol and does not use drugs.  ROS: As per HPI otherwise negative.  Physical Exam: Vitals:   08/05/20 1250 08/05/20 1300 08/05/20 1314 08/05/20 1334  BP: (!) 170/73 (!) 160/72 (!) 155/73 (!) 148/72  Pulse: 87 86 83 89  Resp: (!) 26 19 19 19   Temp:    97.8 F (36.6 C)  TempSrc:    Oral  SpO2: 98% 100% 100% 99%  Weight:      Height:         General: Well developed, well nourished, in no acute distress. Pleasantly demented. Head: Normocephalic, atraumatic, sclera non-icteric, mucus membranes are moist. Neck: Supple without lymphadenopathy/masses. JVD not elevated. Lungs: Clear bilaterally to auscultation without wheezes, rales, or rhonchi.  Heart: RRR with normal S1, S2. No murmurs, rubs, or gallops appreciated. Abdomen: Soft, non-tender, non-distended with normoactive bowel sounds.  Musculoskeletal:  Strength and tone appear normal for age. Lower extremities: Trace LE edema. Lateral 5th metatarsal base with ulceration. Neuro: Alert and oriented X 3, but confused on details. Moves all extremities spontaneously.  Dialysis Access: RUE AVF + thrill  No Known Allergies Prior to Admission medications  Medication Sig Start Date End Date Taking? Authorizing Provider  allopurinol (ZYLOPRIM) 100 MG tablet Take 100 mg by mouth daily.  06/09/14  Yes [provider]  aspirin 81 MG chewable tablet Chew 81 mg by mouth daily.  06/09/14  Yes [provider]  atorvastatin (LIPITOR) 40 MG tablet Take 40 mg by mouth daily.   Yes [provider]  b complex-vitamin c-folic acid (NEPHRO-VITE) 0.8 MG TABS tablet Take 1 tablet by mouth daily. 05/08/20  Yes [provider]  feeding supplement (BOOST  HIGH PROTEIN) LIQD Take 1 Container by mouth daily as needed (nuter).   Yes [provider]  lidocaine-prilocaine (EMLA) cream Apply 1 application topically 3 (three) times a week. 30 minutes prior to Dialysis - MWF 05/08/18  Yes [provider]  metoCLOPramide (REGLAN) 5 MG tablet Take 5 mg by mouth 3 (three) times daily as needed for nausea (/ with meals).   Yes [provider]  NOVOLOG FLEXPEN 100 UNIT/ML FlexPen Inject 0-10 Units into the skin daily as needed for high blood sugar. Per sliding scale 05/29/17  Yes [provider]  predniSONE (DELTASONE) 5 MG tablet Take 5 mg by mouth daily with breakfast. 03/02/20  Yes [provider]  senna-docusate (SENOKOT-S) 8.6-50 MG tablet Take 1 tablet by mouth daily.   Yes [provider]  sertraline (ZOLOFT) 100 MG tablet Take 100 mg by mouth at bedtime. 05/19/20  Yes [provider]  sevelamer carbonate (RENVELA) 800 MG tablet Take 800 mg by mouth 3 (three) times daily. 03/31/20  Yes [provider]  warfarin (COUMADIN) 4 MG tablet Take 1 tablet (4 mg total) by mouth daily at 6 PM. Patient taking differently: Take 4 mg by mouth See admin instructions. 6mg  on MWF, Tues, Thurs, Sat and Sun - 9mg . 09/21/19  Yes Sheikh, Omair Latif, DO  warfarin (COUMADIN) 6 MG tablet Take by mouth See admin instructions. Take 9 mg on Tuesday and Thursday All the other days take 6 mg at dinner 03/31/20  Yes [provider]  glucagon, human recombinant, (GLUCAGEN) 1 MG injection Inject 1 mg into the muscle once as needed for low blood sugar. 09/21/19 09/15/20  Raiford Noble Latif, DO  Insulin Pen Needle (BD PEN NEEDLE NANO U/F) 32G X 4 MM MISC 1 each by Other route as needed (insulin).  09/10/16   [provider]  levalbuterol Penne Lash HFA) 45 MCG/ACT inhaler Inhale 2 puffs into the lungs every 8 (eight) hours as needed for wheezing. 09/21/19   Sheikh, Omair Latif, DO  TRESIBA FLEXTOUCH 200 UNIT/ML  FlexTouch Pen Inject into the skin daily as needed (Low blood glucose). Sliding scale 05/01/20   [provider]   Current Facility-Administered Medications  Medication Dose Route Frequency Provider Last Rate Last Admin  . 0.9 %  sodium chloride infusion  250 mL Intravenous PRN [name], MD      . 0.9 %  sodium chloride infusion  250 mL Intravenous PRN [name], MD 10 mL/hr at 08/05/20 0939 250 mL at 08/05/20 0939  . acetaminophen (TYLENOL) tablet 650 mg  650 mg Oral Q4H PRN [name], MD      . Derrill Memo ON 08/06/2020] aspirin chewable tablet 81 mg  81 mg Oral Daily [name], MD      . atorvastatin (LIPITOR) tablet 40 mg  40 mg Oral q1800 [name], MD      . Derrill Memo ON 08/06/2020] clopidogrel (PLAVIX) tablet 75 mg  75 mg Oral Q breakfast [name], MD      . feeding supplement (BOOST HIGH PROTEIN) liquid 237 mL  1 Container Oral Daily PRN [name], MD      . heparin ADULT infusion 100 units/mL (25000 units/29mL)  900 Units/hr Intravenous Continuous Einar Grad, RPH      . hydrALAZINE (APRESOLINE) injection 5 mg  5 mg Intravenous Q20 Min PRN [name], MD      . insulin aspart (novoLOG) injection 0-9 Units  0-9 Units Subcutaneous TID WC [name], MD      . labetalol (NORMODYNE) injection 10 mg  10 mg Intravenous Q10 min PRN [name], MD   10 mg at 08/05/20 1252  . levalbuterol (XOPENEX HFA) inhaler 2 puff  2 puff Inhalation Q8H PRN [name], MD      . lidocaine-prilocaine (EMLA) cream 1 application  1 application Topical Once per day on Mon Wed Fri [name], MD      . metoCLOPramide (REGLAN) tablet 5 mg  5 mg Oral TID PRN [name], MD      . ondansetron [facility]) injection 4 mg  4 mg Intravenous Q6H PRN [name], MD      . Derrill Memo ON 08/06/2020] predniSONE (DELTASONE) tablet 5 mg  5 mg Oral Q breakfast [name], MD      . senna-docusate (Senokot-S) tablet 1 tablet  1  tablet Oral Daily [name], MD      . sertraline (ZOLOFT) tablet 100 mg  100 mg Oral QHS [name], MD      . sevelamer carbonate (RENVELA) tablet 800 mg  800 mg Oral TID WC [name], MD      . sodium chloride flush (NS) 0.9 % injection 3 mL  3 mL Intravenous Q12H [name], MD      . sodium chloride flush (NS) 0.9 % injection 3 mL  3 mL Intravenous PRN [name], MD      . sodium chloride flush (NS) 0.9 % injection 3 mL  3 mL Intravenous Q12H [name], MD      . sodium chloride flush (NS) 0.9 % injection 3 mL  3 mL Intravenous PRN [name], MD       Labs: Basic Metabolic Panel: Recent Labs  Lab 08/05/20 0621  NA 139  K 5.2*  CL 105  GLUCOSE 126*  BUN 109*  CREATININE 12.10*   CBC: Recent Labs  Lab 08/05/20 0621  HGB 11.6*  HCT 34.0*   CBG: Recent Labs  Lab 08/05/20 0913 08/05/20 1244  GLUCAP 78 166*   Studies/Results: PERIPHERAL VASCULAR CATHETERIZATION  Result Date: 08/05/2020 DATE OF SERVICE: 08/05/2020  PATIENT:  Jonathan Alexandria Sr.  69 y.o. 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  ANESTHESIA:   local and IV sedation  EBL: min  BLOOD ADMINISTERED:none  DRAINS: none  LOCAL MEDICATIONS USED:  LIDOCAINE  SPECIMEN:  none  COUNTS: confirmed correct.  TOURNIQUET:  *  No tourniquets in log *  PATIENT DISPOSITION:  PACU - hemodynamically stable.  Delay start of Pharmacological VTE agent (>24hrs) due to surgical blood loss or risk of bleeding: no  INDICATION FOR PROCEDURE: [name] Sr. is a 69 y.o. male with right fifth toe ulcer. After careful discussion of risks, benefits, and alternatives the patient was offered angiogram with intervention. We specifically discussed access related complication. The patient understood and wished to proceed.  OPERATIVE FINDINGS: Aorta: mild atherosclerosis without flow limiting disease Right iliac arteries: mild atherosclerosis without flow limiting disease Left iliac arteries: mild atherosclerosis without flow limiting disease Right femoral arteries: right common femoral artery with diffuse plaque; >70% stenosis; SFA with diffuse disease, greatest area 95%. Left femoral arteries: common femoral with mild atherosclerosis without flow limiting disease; SFA with diffuse disease, greatest area 90%. Right popliteal arteries: 50% behind-the-knee popliteal stenosis Left popliteal arteries: 60% above knee popliteal stenosis Right tibial trifurcation: AT occludes near its origin. PT/Peroneal flow. 65% proximal PT stenosis. Left tibial trifurcation: poor opacification. PT dominant flow. Right tibial vessels: PT/Peroneal flow to the foot. Left tibial vessels: PT flow to the foot.  Will need RCFA endarterectomy and R fifth toe ray amputation 08/06/20.  DESCRIPTION OF PROCEDURE: After identification of the patient in the pre-operative holding area, the patient was transferred to the operating room. The patient was positioned supine on the operating room table. Anesthesia was induced. The groins was prepped and draped in standard fashion. A surgical pause was performed confirming correct patient, procedure, and operative location.  To begin the left groin was anesthetized with 1% lidocaine. Using ultrasound guidance, the left common femoral artery was accessed with micropuncture technique. Fluoroscopy was used to confirm cannulation over the femoral head. Sheathogram was not performed. The 31F sheath was upsized to 57F.  An 035 Britta Mccreedy wire was advanced into the  distal aorta. Over the wire an omni flush catheter was advanced to the level of L2. Aortogram was performed. See above for details.  The right common iliac artery was selected with an 035 floppy glidewire advantage. The wire was advanced into the superficial femoral artery. Over the wire the omni flush catheter was advanced into the external iliac artery. Selective angiography was performed revealing the above.  The decision was made to intervene. The patient was heparinized with 6000 units of heparin. The sheath was exchanged for a 52F x 45cm sheath. Selective angiography of the left lower extremity was performed prior to intervention. The lesions were treated with a 6x157mm Eluvia stent to the critical SFA stenosis.  Completion angiography revealed resolution of SFA stenosis.  A perclose device was used to close the arteriotomy. Hemostasis was excellent upon completion.  Upon completion of the case instrument and sharps counts were confirmed correct. The patient was transferred to the PACU in good condition. I was present for all portions of the procedure.  [name], MD [facility] Phone Number: [phone] 08/05/2020 8:54 AM    Dialysis Orders:  MWF at [facility] -- last HD 07/28/20 4:15hr, 400/800, EDW 65.5kg, 2K/2Ca, RUE AVF, heparin 3000 bolus - Venofer 50mg  IV q week - Mircera 56mcg IV q 2 weeks - Calcitriol 1.76mcg PO q HD  Assessment/Plan: 1.  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 2.  PAD: S/p LE angiography earlier today with SFA stent, for RCFA endarterectomy 12/30. 3.  ESRD: Has missed over 1 week HD, will bring for dialysis now. BUN > 100. K 5.2 -  2K bath. 4.  Hypertension/volume: BP hgih with trace edema on exam - 2L UF goal to start. 5.  Anemia: Hgb 11.6 - no ESA for now. 6.  Metabolic bone disease: Labs pending, would resume home binders. 7.  T2DM 8.  Hx CVA/?vascular dementia  Pollyann Kennedy 08/05/2020,  2:57 PM  [facility]

## 2020-08-05 NOTE — Interval H&P Note (Signed)
History and Physical Interval Note:  08/05/2020 7:33 AM  Jonathan Alexandria Sr.  has presented today for surgery, with the diagnosis of pad w/ ulcer.  The various methods of treatment have been discussed with the patient and family. After consideration of risks, benefits and other options for treatment, the patient has consented to  Procedure(s): LOWER EXTREMITY ANGIOGRAPHY (N/A) as a surgical intervention.  The patient's history has been reviewed, patient examined, no change in status, stable for surgery.  I have reviewed the patient's chart and labs.  Questions were answered to the patient's satisfaction.     [name]

## 2020-08-05 NOTE — Progress Notes (Signed)
Pt received from cath lab to 4e03. Left groin level 0. Dressing intact with old drainage (marked). Oriented x 4. CHG bath complete. CCMD called. VSS. Will continue to monitor.  Arletta Bale, RN

## 2020-08-05 NOTE — Progress Notes (Signed)
[place] for heparin Indication: hx LV thrombus / CVA  No Known Allergies  Patient Measurements: Height: 6' (182.9 cm) Weight: 63.5 kg (140 lb) IBW/kg (Calculated) : 77.6 Heparin Dosing Weight: 63kg  Vital Signs: Temp: 97.8 F (36.6 C) (12/29 0930) Temp Source: Temporal (12/29 0930) BP: 170/73 (12/29 1250) Pulse Rate: 87 (12/29 1250)  Labs: Recent Labs    08/05/20 0618 08/05/20 0621  HGB  --  11.6*  HCT  --  34.0*  LABPROT 13.7  --   INR 1.1  --   CREATININE  --  12.10*    Estimated Creatinine Clearance: 5.3 mL/min (A) (by C-G formula based on SCr of 12.1 mg/dL (H)).   Medical History: Past Medical History:  Diagnosis Date  . Diabetes mellitus without complication ([place])   . FUO (fever of unknown origin) 09/17/2019  . Hypertension   . Renal disorder 2015   right kidney transplant  . Stroke [facility])      Assessment: 65 yoM on warfarin PTA for hx LV thrombus and CVA admitted for PVD angiography and stenting. Warfarin was held preop, INR 1.1 this morning. Pharmacy asked to begin IV heparin 8h after removal of sheath. Warfarin remains on hold for toe amputation.   Goal of Therapy:  Heparin level 0.3-0.7 units/ml Monitor platelets by anticoagulation protocol: Yes   Plan:  -Heparin 900 units/h no bolus starting at 1700 -Check 8h heparin level after restart   Arrie Senate, PharmD, BCPS, [facility] Clinical Pharmacist [phone] Please check AMION for all [place] numbers 08/05/2020

## 2020-08-06 ENCOUNTER — Encounter (HOSPITAL_COMMUNITY)
Admission: RE | Disposition: A | Discharge status: Home Health | Payer: Self-pay | Source: Home / Self Care | Attending: Vascular Surgery

## 2020-08-06 ENCOUNTER — Inpatient Hospital Stay (HOSPITAL_COMMUNITY): Payer: Medicare Other | Admitting: Certified Registered"

## 2020-08-06 ENCOUNTER — Encounter (HOSPITAL_COMMUNITY): Payer: Self-pay | Admitting: Vascular Surgery

## 2020-08-06 HISTORY — PX: ENDARTERECTOMY FEMORAL: SHX5804

## 2020-08-06 HISTORY — PX: AMPUTATION: SHX166

## 2020-08-06 HISTORY — PX: PATCH ANGIOPLASTY: SHX6230

## 2020-08-06 LAB — GLUCOSE, CAPILLARY
Glucose-Capillary: 88 mg/dL (ref 70–99)
Glucose-Capillary: 77 mg/dL (ref 70–99)
Glucose-Capillary: 85 mg/dL (ref 70–99)
Glucose-Capillary: 156 mg/dL — ABNORMAL HIGH (ref 70–99)
Glucose-Capillary: 104 mg/dL — ABNORMAL HIGH (ref 70–99)

## 2020-08-06 LAB — SURGICAL PCR SCREEN
MRSA, PCR: NEGATIVE
Staphylococcus aureus: NEGATIVE

## 2020-08-06 LAB — BASIC METABOLIC PANEL
Anion gap: 11 (ref 5–15)
Anion gap: 12 (ref 5–15)
BUN: 49 mg/dL — ABNORMAL HIGH (ref 8–23)
BUN: 51 mg/dL — ABNORMAL HIGH (ref 8–23)
CO2: 24 mmol/L (ref 22–32)
CO2: 25 mmol/L (ref 22–32)
Calcium: 8.2 mg/dL — ABNORMAL LOW (ref 8.9–10.3)
Calcium: 8.3 mg/dL — ABNORMAL LOW (ref 8.9–10.3)
Chloride: 104 mmol/L (ref 98–111)
Chloride: 103 mmol/L (ref 98–111)
Creatinine, Ser: 8.04 mg/dL — ABNORMAL HIGH (ref 0.61–1.24)
Creatinine, Ser: 8.05 mg/dL — ABNORMAL HIGH (ref 0.61–1.24)
GFR, Estimated: 7 mL/min — ABNORMAL LOW (ref >60)
GFR, Estimated: 7 mL/min — ABNORMAL LOW (ref >60)
Glucose, Bld: 82 mg/dL (ref 70–99)
Glucose, Bld: 142 mg/dL — ABNORMAL HIGH (ref 70–99)
Potassium: 6.8 mmol/L (ref 3.5–5.1)
Potassium: 4.9 mmol/L (ref 3.5–5.1)
Sodium: 139 mmol/L (ref 135–145)
Sodium: 140 mmol/L (ref 135–145)

## 2020-08-06 LAB — CBC
HCT: 29.7 % — ABNORMAL LOW (ref 39.0–52.0)
Hemoglobin: 9.5 g/dL — ABNORMAL LOW (ref 13.0–17.0)
MCH: 31.7 pg (ref 26.0–34.0)
MCHC: 32 g/dL (ref 30.0–36.0)
MCV: 99 fL (ref 80.0–100.0)
Platelets: 181 10*3/uL (ref 150–400)
RBC: 3 MIL/uL — ABNORMAL LOW (ref 4.22–5.81)
RDW: 19.9 % — ABNORMAL HIGH (ref 11.5–15.5)
WBC: 5.4 10*3/uL (ref 4.0–10.5)
nRBC: 0 % (ref 0.0–0.2)

## 2020-08-06 LAB — ABO/RH: ABO/RH(D): O POS

## 2020-08-06 LAB — HEPARIN LEVEL (UNFRACTIONATED): Heparin Unfractionated: <0.1 IU/mL — ABNORMAL LOW (ref 0.30–0.70)

## 2020-08-06 SURGERY — ENDARTERECTOMY, FEMORAL
Anesthesia: General | Site: Groin | Laterality: Right

## 2020-08-06 MED ORDER — CEFAZOLIN SODIUM-DEXTROSE 2-4 GM/100ML-% IV SOLN
2.0000 g | Freq: Three times a day (TID) | INTRAVENOUS | Status: AC
  Administered 2020-08-06 – 2020-08-07 (×2): 2 g via INTRAVENOUS
  Filled 2020-08-06 (×2): qty 100

## 2020-08-06 MED ORDER — DEXAMETHASONE SODIUM PHOSPHATE 10 MG/ML IJ SOLN
INTRAMUSCULAR | Status: AC
  Filled 2020-08-06: qty 1

## 2020-08-06 MED ORDER — ONDANSETRON HCL 4 MG/2ML IJ SOLN: 4.0000 mg | Freq: Once | INTRAMUSCULAR | Status: DC | PRN

## 2020-08-06 MED ORDER — ALBUMIN HUMAN 5 % IV SOLN: INTRAVENOUS | Status: DC | PRN

## 2020-08-06 MED ORDER — 0.9 % SODIUM CHLORIDE (POUR BTL) OPTIME
TOPICAL | Status: DC | PRN
  Administered 2020-08-06 (×2): 1000 mL

## 2020-08-06 MED ORDER — HEMOSTATIC AGENTS (NO CHARGE) OPTIME
TOPICAL | Status: DC | PRN
  Administered 2020-08-06 (×2): 1 via TOPICAL

## 2020-08-06 MED ORDER — PROTAMINE SULFATE 10 MG/ML IV SOLN
INTRAVENOUS | Status: DC | PRN
  Administered 2020-08-06: 5 mg via INTRAVENOUS

## 2020-08-06 MED ORDER — EPHEDRINE 5 MG/ML INJ
INTRAVENOUS | Status: AC
  Filled 2020-08-06: qty 10

## 2020-08-06 MED ORDER — METOPROLOL TARTRATE 5 MG/5ML IV SOLN: 2.0000 mg | INTRAVENOUS | Status: DC | PRN

## 2020-08-06 MED ORDER — SODIUM CHLORIDE 0.9 % IV SOLN: 500.0000 mL | Freq: Once | INTRAVENOUS | Status: DC | PRN

## 2020-08-06 MED ORDER — PHENYLEPHRINE HCL-NACL 10-0.9 MG/250ML-% IV SOLN
INTRAVENOUS | Status: DC | PRN
  Administered 2020-08-06: 30 ug/min via INTRAVENOUS

## 2020-08-06 MED ORDER — INSULIN ASPART 100 UNIT/ML ~~LOC~~ SOLN
SUBCUTANEOUS | Status: AC
  Filled 2020-08-06: qty 1

## 2020-08-06 MED ORDER — DEXTROSE 50 % IV SOLN
INTRAVENOUS | Status: AC
  Filled 2020-08-06: qty 50

## 2020-08-06 MED ORDER — GUAIFENESIN-DM 100-10 MG/5ML PO SYRP: 15.0000 mL | ORAL_SOLUTION | ORAL | Status: DC | PRN

## 2020-08-06 MED ORDER — PANTOPRAZOLE SODIUM 40 MG PO TBEC
40.0000 mg | DELAYED_RELEASE_TABLET | Freq: Every day | ORAL | Status: DC
  Administered 2020-08-06 – 2020-08-10 (×5): 40 mg via ORAL
  Filled 2020-08-06 (×6): qty 1

## 2020-08-06 MED ORDER — POLYETHYLENE GLYCOL 3350 17 G PO PACK: 17.0000 g | PACK | Freq: Every day | ORAL | Status: DC | PRN

## 2020-08-06 MED ORDER — HEPARIN SODIUM (PORCINE) 1000 UNIT/ML IJ SOLN
INTRAMUSCULAR | Status: DC | PRN
  Administered 2020-08-06: 1000 [IU] via INTRAVENOUS
  Administered 2020-08-06: 7000 [IU] via INTRAVENOUS

## 2020-08-06 MED ORDER — SODIUM CHLORIDE 0.9 % IV SOLN
INTRAVENOUS | Status: DC | PRN
  Administered 2020-08-06: 500 mL

## 2020-08-06 MED ORDER — SODIUM CHLORIDE 0.9 % IV SOLN: INTRAVENOUS | Status: DC

## 2020-08-06 MED ORDER — INSULIN ASPART 100 UNIT/ML IV SOLN
5.0000 [IU] | Freq: Once | INTRAVENOUS | Status: AC
  Administered 2020-08-06: 5 [IU] via INTRAVENOUS
  Filled 2020-08-06: qty 0.05

## 2020-08-06 MED ORDER — CHLORHEXIDINE GLUCONATE 0.12 % MT SOLN
15.0000 mL | OROMUCOSAL | Status: AC
  Filled 2020-08-06: qty 15

## 2020-08-06 MED ORDER — FENTANYL CITRATE (PF) 250 MCG/5ML IJ SOLN
INTRAMUSCULAR | Status: AC
  Filled 2020-08-06: qty 5

## 2020-08-06 MED ORDER — SUGAMMADEX SODIUM 200 MG/2ML IV SOLN
INTRAVENOUS | Status: DC | PRN
  Administered 2020-08-06: 150 mg via INTRAVENOUS

## 2020-08-06 MED ORDER — FENTANYL CITRATE (PF) 100 MCG/2ML IJ SOLN: 25.0000 ug | INTRAMUSCULAR | Status: DC | PRN

## 2020-08-06 MED ORDER — BISACODYL 10 MG RE SUPP: 10.0000 mg | Freq: Every day | RECTAL | Status: DC | PRN

## 2020-08-06 MED ORDER — PHENYLEPHRINE 40 MCG/ML (10ML) SYRINGE FOR IV PUSH (FOR BLOOD PRESSURE SUPPORT)
PREFILLED_SYRINGE | INTRAVENOUS | Status: DC | PRN
  Administered 2020-08-06 (×3): 80 ug via INTRAVENOUS

## 2020-08-06 MED ORDER — LIDOCAINE 2% (20 MG/ML) 5 ML SYRINGE
INTRAMUSCULAR | Status: DC | PRN
  Administered 2020-08-06: 100 mg via INTRAVENOUS

## 2020-08-06 MED ORDER — DARBEPOETIN ALFA 100 MCG/0.5ML IJ SOSY
100.0000 ug | PREFILLED_SYRINGE | INTRAMUSCULAR | Status: DC
  Filled 2020-08-06: qty 0.5

## 2020-08-06 MED ORDER — FENTANYL CITRATE (PF) 100 MCG/2ML IJ SOLN
INTRAMUSCULAR | Status: DC | PRN
  Administered 2020-08-06: 100 ug via INTRAVENOUS
  Administered 2020-08-06: 50 ug via INTRAVENOUS

## 2020-08-06 MED ORDER — MIDAZOLAM HCL 5 MG/5ML IJ SOLN
INTRAMUSCULAR | Status: DC | PRN
  Administered 2020-08-06: 1 mg via INTRAVENOUS

## 2020-08-06 MED ORDER — ESMOLOL HCL 100 MG/10ML IV SOLN
INTRAVENOUS | Status: DC | PRN
  Administered 2020-08-06: 20 mg via INTRAVENOUS

## 2020-08-06 MED ORDER — POTASSIUM CHLORIDE CRYS ER 20 MEQ PO TBCR: 20.0000 meq | EXTENDED_RELEASE_TABLET | Freq: Every day | ORAL | Status: DC | PRN

## 2020-08-06 MED ORDER — MAGNESIUM SULFATE 2 GM/50ML IV SOLN: 2.0000 g | Freq: Every day | INTRAVENOUS | Status: DC | PRN

## 2020-08-06 MED ORDER — MUPIROCIN 2 % EX OINT
1.0000 "application " | TOPICAL_OINTMENT | Freq: Two times a day (BID) | CUTANEOUS | Status: DC
  Administered 2020-08-06 – 2020-08-10 (×7): 1 via NASAL
  Filled 2020-08-06: qty 22

## 2020-08-06 MED ORDER — DEXTROSE 50 % IV SOLN
1.0000 | Freq: Once | INTRAVENOUS | Status: AC
  Administered 2020-08-06: 50 mL via INTRAVENOUS

## 2020-08-06 MED ORDER — MIDAZOLAM HCL 2 MG/2ML IJ SOLN
INTRAMUSCULAR | Status: AC
  Filled 2020-08-06: qty 2

## 2020-08-06 MED ORDER — ROCURONIUM BROMIDE 100 MG/10ML IV SOLN
INTRAVENOUS | Status: DC | PRN
  Administered 2020-08-06: 50 mg via INTRAVENOUS
  Administered 2020-08-06 (×2): 20 mg via INTRAVENOUS

## 2020-08-06 MED ORDER — DOCUSATE SODIUM 100 MG PO CAPS
100.0000 mg | ORAL_CAPSULE | Freq: Every day | ORAL | Status: DC
  Administered 2020-08-09 – 2020-08-10 (×2): 100 mg via ORAL
  Filled 2020-08-06 (×3): qty 1

## 2020-08-06 MED ORDER — DEXAMETHASONE SODIUM PHOSPHATE 10 MG/ML IJ SOLN
INTRAMUSCULAR | Status: DC | PRN
  Administered 2020-08-06: 5 mg via INTRAVENOUS

## 2020-08-06 MED ORDER — HYDROMORPHONE HCL 1 MG/ML IJ SOLN: 0.5000 mg | INTRAMUSCULAR | Status: DC | PRN

## 2020-08-06 MED ORDER — CEFAZOLIN SODIUM 1 G IJ SOLR
INTRAMUSCULAR | Status: AC
  Filled 2020-08-06: qty 20

## 2020-08-06 MED ORDER — BACITRACIN ZINC 500 UNIT/GM EX OINT
TOPICAL_OINTMENT | CUTANEOUS | Status: AC
  Filled 2020-08-06: qty 28.35

## 2020-08-06 MED ORDER — PHENOL 1.4 % MT LIQD: 1.0000 | OROMUCOSAL | Status: DC | PRN

## 2020-08-06 MED ORDER — OXYCODONE-ACETAMINOPHEN 5-325 MG PO TABS
1.0000 | ORAL_TABLET | ORAL | Status: DC | PRN
  Administered 2020-08-10: 1 via ORAL
  Filled 2020-08-06: qty 1

## 2020-08-06 MED ORDER — CALCITRIOL 0.25 MCG PO CAPS
1.7500 ug | ORAL_CAPSULE | ORAL | Status: DC
  Administered 2020-08-07 – 2020-08-10 (×2): 1.75 ug via ORAL
  Filled 2020-08-06: qty 7

## 2020-08-06 MED ORDER — ONDANSETRON HCL 4 MG/2ML IJ SOLN
INTRAMUSCULAR | Status: DC | PRN
  Administered 2020-08-06: 4 mg via INTRAVENOUS

## 2020-08-06 MED ORDER — SODIUM CHLORIDE 0.9 % IV SOLN
INTRAVENOUS | Status: AC
  Filled 2020-08-06: qty 1.2

## 2020-08-06 MED ORDER — CHLORHEXIDINE GLUCONATE 0.12 % MT SOLN
OROMUCOSAL | Status: AC
  Administered 2020-08-06: 15 mL via OROMUCOSAL
  Filled 2020-08-06: qty 15

## 2020-08-06 MED ORDER — PROPOFOL 10 MG/ML IV BOLUS
INTRAVENOUS | Status: DC | PRN
  Administered 2020-08-06: 120 mg via INTRAVENOUS
  Administered 2020-08-06: 40 mg via INTRAVENOUS

## 2020-08-06 MED ORDER — CEFAZOLIN SODIUM-DEXTROSE 2-3 GM-%(50ML) IV SOLR
INTRAVENOUS | Status: DC | PRN
  Administered 2020-08-06: 2 g via INTRAVENOUS

## 2020-08-06 MED ORDER — ALUM & MAG HYDROXIDE-SIMETH 200-200-20 MG/5ML PO SUSP: 15.0000 mL | ORAL | Status: DC | PRN

## 2020-08-06 SURGICAL SUPPLY — 64 items
BENZOIN TINCTURE PRP APPL 2/3 (GAUZE/BANDAGES/DRESSINGS) ×3 IMPLANT
BLADE AVERAGE 25X9 (BLADE) IMPLANT
BNDG CONFORM 3 STRL LF (GAUZE/BANDAGES/DRESSINGS) IMPLANT
BNDG ELASTIC 4X5.8 VLCR STR LF (GAUZE/BANDAGES/DRESSINGS) ×3 IMPLANT
BNDG GAUZE ELAST 4 BULKY (GAUZE/BANDAGES/DRESSINGS) ×3 IMPLANT
CANISTER SUCT 3000ML PPV (MISCELLANEOUS) ×3 IMPLANT
CANNULA VESSEL 3MM 2 BLNT TIP (CANNULA) ×6 IMPLANT
CHLORAPREP W/TINT 26 (MISCELLANEOUS) ×6 IMPLANT
CLIP VESOCCLUDE MED 24/CT (CLIP) ×3 IMPLANT
CLIP VESOCCLUDE SM WIDE 24/CT (CLIP) ×3 IMPLANT
COVER SURGICAL LIGHT HANDLE (MISCELLANEOUS) ×3 IMPLANT
COVER WAND RF STERILE (DRAPES) IMPLANT
DERMABOND ADVANCED (GAUZE/BANDAGES/DRESSINGS) ×1
DERMABOND ADVANCED .7 DNX12 (GAUZE/BANDAGES/DRESSINGS) ×2 IMPLANT
DRAIN CHANNEL 15F RND FF W/TCR (WOUND CARE) IMPLANT
DRAPE HALF SHEET 40X57 (DRAPES) ×3 IMPLANT
DRAPE ORTHO SPLIT 77X108 STRL (DRAPES) ×2
DRAPE SURG ORHT 6 SPLT 77X108 (DRAPES) ×4 IMPLANT
DRSG XEROFORM 1X8 (GAUZE/BANDAGES/DRESSINGS) ×3 IMPLANT
ELECT REM PT RETURN 9FT ADLT (ELECTROSURGICAL) ×3
ELECTRODE REM PT RTRN 9FT ADLT (ELECTROSURGICAL) ×2 IMPLANT
EVACUATOR SILICONE 100CC (DRAIN) IMPLANT
GAUZE SPONGE 4X4 12PLY STRL (GAUZE/BANDAGES/DRESSINGS) IMPLANT
GAUZE SPONGE 4X4 12PLY STRL LF (GAUZE/BANDAGES/DRESSINGS) ×3 IMPLANT
GAUZE XEROFORM 1X8 LF (GAUZE/BANDAGES/DRESSINGS) IMPLANT
GLOVE ECLIPSE 6.5 STRL STRAW (GLOVE) ×3 IMPLANT
GLOVE SURG SS PI 8.0 STRL IVOR (GLOVE) ×3 IMPLANT
GLOVE SURG UNDER POLY LF SZ7 (GLOVE) ×3 IMPLANT
GOWN STRL REUS W/ TWL LRG LVL3 (GOWN DISPOSABLE) ×4 IMPLANT
GOWN STRL REUS W/ TWL XL LVL3 (GOWN DISPOSABLE) ×2 IMPLANT
GOWN STRL REUS W/TWL LRG LVL3 (GOWN DISPOSABLE) ×2
GOWN STRL REUS W/TWL XL LVL3 (GOWN DISPOSABLE) ×1
HEMOSTAT SNOW SURGICEL 2X4 (HEMOSTASIS) ×6 IMPLANT
KIT BASIN OR (CUSTOM PROCEDURE TRAY) ×3 IMPLANT
KIT TURNOVER KIT B (KITS) ×3 IMPLANT
LOOP VESSEL MINI RED (MISCELLANEOUS) ×3 IMPLANT
NS IRRIG 1000ML POUR BTL (IV SOLUTION) ×6 IMPLANT
PACK GENERAL/GYN (CUSTOM PROCEDURE TRAY) ×3 IMPLANT
PACK PERIPHERAL VASCULAR (CUSTOM PROCEDURE TRAY) ×3 IMPLANT
PAD ARMBOARD 7.5X6 YLW CONV (MISCELLANEOUS) ×6 IMPLANT
PATCH VASC XENOSURE 1CMX6CM (Vascular Products) ×1 IMPLANT
PATCH VASC XENOSURE 1X6 (Vascular Products) ×2 IMPLANT
STAPLER VISISTAT (STAPLE) ×3 IMPLANT
STAPLER VISISTAT 35W (STAPLE) ×3 IMPLANT
STRIP CLOSURE SKIN 1/2X4 (GAUZE/BANDAGES/DRESSINGS) IMPLANT
SUT ETHILON 2 0 PSLX (SUTURE) IMPLANT
SUT ETHILON 3 0 PS 1 (SUTURE) ×3 IMPLANT
SUT MNCRL AB 4-0 PS2 18 (SUTURE) ×3 IMPLANT
SUT MON AB 4-0 PC3 18 (SUTURE) ×3 IMPLANT
SUT PROLENE 5 0 C 1 24 (SUTURE) ×33 IMPLANT
SUT PROLENE 6 0 BV (SUTURE) ×3 IMPLANT
SUT SILK 2 0 SH CR/8 (SUTURE) ×3 IMPLANT
SUT SILK 2 0SH CR/8 30 (SUTURE) IMPLANT
SUT VIC AB 2-0 CT1 18 (SUTURE) IMPLANT
SUT VIC AB 2-0 CT1 27 (SUTURE) ×1
SUT VIC AB 2-0 CT1 TAPERPNT 27 (SUTURE) ×2 IMPLANT
SUT VIC AB 3-0 SH 27 (SUTURE) ×2
SUT VIC AB 3-0 SH 27X BRD (SUTURE) ×4 IMPLANT
SUT VIC AB 3-0 SH 8-18 (SUTURE) IMPLANT
SYR BULB IRRIG 60ML STRL (SYRINGE) ×3 IMPLANT
TOWEL GREEN STERILE (TOWEL DISPOSABLE) ×6 IMPLANT
TOWEL GREEN STERILE FF (TOWEL DISPOSABLE) ×3 IMPLANT
UNDERPAD 30X36 HEAVY ABSORB (UNDERPADS AND DIAPERS) ×3 IMPLANT
WATER STERILE IRR 1000ML POUR (IV SOLUTION) ×3 IMPLANT

## 2020-08-06 NOTE — Progress Notes (Signed)
   ASSESSMENT & PLAN:  Jonathan Carmean Sr. is a 69 y.o. male with critical limb ischemia of right lower extremity - right fifth toe ulceration with osteomyelitis. S/P R SFA stenting 08/06/20. For R CFA endarterectomy and R fifth toe amputation today.  SUBJECTIVE:  No complaints.  OBJECTIVE:  BP (!) 149/71 (BP Location: Left Arm)   Pulse 87   Temp 98.2 F (36.8 C) (Oral)   Resp 18   Ht 6' (1.829 m)   Wt 68.9 kg   SpO2 99%   BMI 20.60 kg/m   Intake/Output Summary (Last 24 hours) at 08/06/2020 0952 Last data filed at 08/06/2020 0650 Gross per 24 hour  Intake 346.15 ml  Output 2500 ml  Net -2153.85 ml    Constitutional: well appearing. no acute distress. Vascular: unchanged R fifth toe ulcer.  CBC Latest Ref Rng & Units 08/06/2020 08/05/2020 09/21/2019  WBC 4.0 - 10.5 K/uL 5.4 - 8.9  Hemoglobin 13.0 - 17.0 g/dL 9.5(L) 11.6(L) 10.7(L)  Hematocrit 39.0 - 52.0 % 29.7(L) 34.0(L) 34.7(L)  Platelets 150 - 400 K/uL 181 - 290     CMP Latest Ref Rng & Units 08/06/2020 08/05/2020 09/22/2019  Glucose 70 - 99 mg/dL 88 126(H) 151(H)  BUN 8 - 23 mg/dL 45(H) 109(H) 25(H)  Creatinine 0.61 - 1.24 mg/dL 6.96(H) 12.10(H) 4.92(H)  Sodium 135 - 145 mmol/L 141 139 136  Potassium 3.5 - 5.1 mmol/L 4.8 5.2(H) 4.2  Chloride 98 - 111 mmol/L 102 105 99  CO2 22 - 32 mmol/L 27 - 25  Calcium 8.9 - 10.3 mg/dL 8.4(L) - 8.7(L)  Total Protein 6.5 - 8.1 g/dL - - -  Total Bilirubin 0.3 - 1.2 mg/dL - - -  Alkaline Phos 38 - 126 U/L - - -  AST 15 - 41 U/L - - -  ALT 0 - 44 U/L - - -    Estimated Creatinine Clearance: 10 mL/min (A) (by C-G formula based on SCr of 6.96 mg/dL (H)).  Jonathan [name]. Jonathan Breed, MD [facility] Phone Number: [phone] 08/06/2020 9:52 AM

## 2020-08-06 NOTE — Progress Notes (Signed)
Pt returned to 4E03 from PACU. VSS and pt denies any pain. Compression wrap on right foot c/d/i and R groin site level 2, with perimeter of hematoma marked by PACU RN. Will continue to monitor closely.

## 2020-08-06 NOTE — Transfer of Care (Signed)
Immediate Anesthesia Transfer of Care Note  Patient: Jonathan Beazer Sr.  Procedure(s) Performed: Right Ilio- Femoral Artery Endarterectomy (Right Groin) Right Fifth Toe Ray Amputation (Right Foot) PATCH ANGIOPLASTY (Right Groin)  Patient Location: PACU  Anesthesia Type:General  Level of Consciousness: drowsy  Airway & Oxygen Therapy: Patient Spontanous Breathing and Patient connected to nasal cannula oxygen  Post-op Assessment: Report given to RN and Post -op Vital signs reviewed and stable  Post vital signs: Reviewed and stable  Last Vitals:  Vitals Value Taken Time  BP    Temp    Pulse 85 08/06/20 1617  Resp 14 08/06/20 1617  SpO2 100 % 08/06/20 1617  Vitals shown include unvalidated device data.  Last Pain:  Vitals:   08/06/20 1020  TempSrc:   PainSc: 0-No pain      Patients Stated Pain Goal: 0 (58/83/25 4982)  Complications: No complications documented.

## 2020-08-06 NOTE — Anesthesia Procedure Notes (Deleted)
Arterial Line Insertion Start/End12/30/2021 1:09 PM, 08/06/2020 1:18 PM Performed by: Nolon Nations, MD, anesthesiologist  Patient location: OR. Preanesthetic checklist: patient identified, IV checked, site marked, risks and benefits discussed, surgical consent, monitors and equipment checked, pre-op evaluation, timeout performed and anesthesia consent Patient sedated Left, radial was placed Catheter size: 20 G Hand hygiene performed , maximum sterile barriers used  and Seldinger technique used Allen's test indicative of satisfactory collateral circulation Attempts: 1 Procedure performed using ultrasound guided technique. Ultrasound Notes:anatomy identified, needle tip was noted to be adjacent to the nerve/plexus identified and no ultrasound evidence of intravascular and/or intraneural injection Following insertion, dressing applied and Biopatch. Post procedure assessment: normal  Patient tolerated the procedure well with no immediate complications.

## 2020-08-06 NOTE — Anesthesia Procedure Notes (Signed)
Procedure Name: Intubation Date/Time: 08/06/2020 1:04 PM Performed by: Gwyndolyn Saxon, CRNA Pre-anesthesia Checklist: Patient identified, Emergency Drugs available, Suction available and Patient being monitored Patient Re-evaluated:Patient Re-evaluated prior to induction Oxygen Delivery Method: Circle system utilized Preoxygenation: Pre-oxygenation with 100% oxygen Induction Type: IV induction Ventilation: Mask ventilation without difficulty and Oral airway inserted - appropriate to patient size Laryngoscope Size: Sabra Heck and 2 Grade View: Grade I Tube size: 7.5 mm Number of attempts: 1 Airway Equipment and Method: Patient positioned with wedge pillow and Stylet Placement Confirmation: ETT inserted through vocal cords under direct vision,  positive ETCO2 and breath sounds checked- equal and bilateral Secured at: 23 cm Tube secured with: Tape Dental Injury: Teeth and Oropharynx as per pre-operative assessment  Comments: Pt with poor dentition; multiple chipped/broken teeth, most notably upper right central incisor. Dentition same as preop after intubation

## 2020-08-06 NOTE — Progress Notes (Signed)
[name] Progress Note   Subjective:  Seen in room - getting prepped to go down to OR for surgery. Denies CP or dyspnea. Dialyzed last night without issues.  Objective Vitals:   08/06/20 0020 08/06/20 0329 08/06/20 0550 08/06/20 0755  BP: (!) 155/77 (!) 155/65  (!) 149/71  Pulse: 92 90 87 87  Resp: 19 16 16 18   Temp: 98.2 F (36.8 C) 98.4 F (36.9 C)  98.2 F (36.8 C)  TempSrc: Oral Oral  Oral  SpO2: 100% 99% 99% 99%  Weight:   68.9 kg   Height:       Physical Exam General: Well appearing man, NAD. Pleasantly demented. Heart: RRR; no murmur Lungs: CTA anteriorly Abdomen: soft, non-tender Extremities: No LE edema, R foot in sock - not examined Dialysis Access: RUE AVF  Additional Objective Labs: Basic Metabolic Panel: Recent Labs  Lab 08/05/20 0621 08/06/20 0050  NA 139 141  K 5.2* 4.8  CL 105 102  CO2  --  27  GLUCOSE 126* 88  BUN 109* 45*  CREATININE 12.10* 6.96*  CALCIUM  --  8.4*   CBC: Recent Labs  Lab 08/05/20 0621 08/06/20 0050  WBC  --  5.4  HGB 11.6* 9.5*  HCT 34.0* 29.7*  MCV  --  99.0  PLT  --  181   Studies/Results: PERIPHERAL VASCULAR CATHETERIZATION  Result Date: 08/05/2020 DATE OF SERVICE: 08/05/2020  PATIENT:  Jonathan Alexandria Sr.  69 y.o. 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  ANESTHESIA:   local and IV sedation  EBL: min  BLOOD ADMINISTERED:none  DRAINS: none  LOCAL MEDICATIONS USED:  LIDOCAINE  SPECIMEN:  none  COUNTS: confirmed correct.  TOURNIQUET:  * No tourniquets  in log *  PATIENT DISPOSITION:  PACU - hemodynamically stable.  Delay start of Pharmacological VTE agent (>24hrs) due to surgical blood loss or risk of bleeding: no  INDICATION FOR PROCEDURE: Jonathan Holsinger Sr. is a 69 y.o. male with right fifth toe ulcer. After careful discussion of risks, benefits, and alternatives the patient was offered angiogram with intervention. We specifically discussed access related complication. The patient understood and wished to proceed.  OPERATIVE FINDINGS: Aorta: mild atherosclerosis without flow limiting disease Right iliac arteries: mild atherosclerosis without flow limiting disease Left iliac arteries: mild atherosclerosis without flow limiting disease Right femoral arteries: right common femoral artery with diffuse plaque; >70% stenosis; SFA with diffuse disease, greatest area 95%. Left femoral arteries: common femoral with mild atherosclerosis without flow limiting disease; SFA with diffuse disease, greatest area 90%. Right popliteal arteries: 50% behind-the-knee popliteal stenosis Left popliteal arteries: 60% above knee popliteal stenosis Right tibial trifurcation: AT occludes near its origin. PT/Peroneal flow. 65% proximal PT stenosis. Left tibial trifurcation: poor opacification. PT dominant flow. Right tibial vessels: PT/Peroneal flow to the foot. Left tibial vessels: PT flow to the foot.  Will need RCFA endarterectomy and R fifth toe ray amputation 08/06/20.  DESCRIPTION OF PROCEDURE: After identification of the patient in the pre-operative holding area, the patient was transferred to the operating room. The patient  was positioned supine on the operating room table. Anesthesia was induced. The groins was prepped and draped in standard fashion. A surgical pause was performed confirming correct patient, procedure, and operative location.  To begin the left groin was anesthetized with 1% lidocaine. Using ultrasound guidance, the left common femoral artery was accessed with  micropuncture technique. Fluoroscopy was used to confirm cannulation over the femoral head. Sheathogram was not performed. The 578F sheath was upsized to 478F.  An 035 [name] wire was advanced into the distal aorta. Over the wire an omni flush catheter was advanced to the level of L2. Aortogram was performed. See above for details.  The right common iliac artery was selected with an 035 floppy glidewire advantage. The wire was advanced into the superficial femoral artery. Over the wire the omni flush catheter was advanced into the external iliac artery. Selective angiography was performed revealing the above.  The decision was made to intervene. The patient was heparinized with 6000 units of heparin. The sheath was exchanged for a 78F x 45cm sheath. Selective angiography of the left lower extremity was performed prior to intervention. The lesions were treated with a 6x119mm Eluvia stent to the critical SFA stenosis.  Completion angiography revealed resolution of SFA stenosis.  A perclose device was used to close the arteriotomy. Hemostasis was excellent upon completion.  Upon completion of the case instrument and sharps counts were confirmed correct. The patient was transferred to the PACU in good condition. I was present for all portions of the procedure.  [name]. Stanford Breed, MD [facility] Phone Number: [phone] 08/05/2020 8:54 AM   Medications: . sodium chloride    . heparin 1,100 Units/hr (08/06/20 0650)   . aspirin  81 mg Oral Daily  . atorvastatin  40 mg Oral q1800  . Chlorhexidine Gluconate Cloth  6 each Topical Q0600  . clopidogrel  75 mg Oral Q breakfast  . insulin aspart  0-9 Units Subcutaneous TID WC  . lidocaine-prilocaine  1 application Topical Once per day on Mon Wed Fri  . predniSONE  5 mg Oral Q breakfast  . senna-docusate  1 tablet Oral Daily  . sertraline  100 mg Oral QHS  . sevelamer carbonate  800 mg Oral TID WC  . sodium chloride  flush  3 mL Intravenous Q12H    Dialysis Orders: MWF at [facility] -- last HD 07/28/20 4:15hr, 400/800, EDW 65.5kg, 2K/2Ca, RUE AVF, heparin 3000 bolus - Venofer 50mg  IV q week - Mircera 8mcg IV q 2 weeks - Calcitriol 1.69mcg PO q HD  Assessment/Plan: 1.  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 2.  PAD: S/p LE angiography earlier today with SFA stent, for RCFA endarterectomy 12/30. 3.  ESRD: Had missed over 1 week HD on admit - s/p HD 12/29. Next HD on 12/31.   4.  Hypertension/volume: BP borderline - should improve with further dialysis. 5.  Anemia: Hgb 9.5 - will redose ESA tomorrow. 6.  Metabolic bone disease: Ca ok/Phos pending, continue Renvela/VDRA. 7.  T2DM 8.  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

## 2020-08-06 NOTE — Progress Notes (Signed)
Patient to OR

## 2020-08-06 NOTE — Progress Notes (Signed)
ANTICOAGULATION CONSULT NOTE  Pharmacy Consult for Heparin Indication: hx LV thrombus / CVA  No Known Allergies  Patient Measurements: Height: 6' (182.9 cm) Weight: 67.8 kg (149 lb 7.6 oz) IBW/kg (Calculated) : 77.6 Heparin Dosing Weight: 63kg  Vital Signs: Temp: 98.4 F (36.9 C) (12/30 0329) Temp Source: Oral (12/30 0329) BP: 155/65 (12/30 0329) Pulse Rate: 90 (12/30 0329)  Labs: Recent Labs    08/05/20 0618 08/05/20 0621 08/06/20 0050 08/06/20 0055  HGB  --  11.6* 9.5*  --   HCT  --  34.0* 29.7*  --   PLT  --   --  181  --   LABPROT 13.7  --   --   --   INR 1.1  --   --   --   HEPARINUNFRC  --   --   --  <0.10*  CREATININE  --  12.10* 6.96*  --     Estimated Creatinine Clearance: 9.9 mL/min (A) (by C-G formula based on SCr of 6.96 mg/dL (H)).   Medical History: Past Medical History:  Diagnosis Date  . Diabetes mellitus without complication ([place])   . FUO (fever of unknown origin) 09/17/2019  . Hypertension   . Renal disorder 2015   right kidney transplant  . Stroke [facility])      Assessment: 22 yoM on warfarin PTA for hx LV thrombus and CVA admitted for PVD angiography and stenting. Warfarin was held preop, INR 1.1 this morning. Pharmacy asked to begin IV heparin 8h after removal of sheath. Warfarin remains on hold for toe amputation.   12/30 AM update:  Heparin level low No issues per RN  Goal of Therapy:  Heparin level 0.3-0.7 units/ml Monitor platelets by anticoagulation protocol: Yes   Plan:  -Inc heparin to 1100 units/hr -1400 heparin level  Narda Bonds, PharmD, BCPS Clinical Pharmacist Phone: [phone]

## 2020-08-06 NOTE — Anesthesia Postprocedure Evaluation (Signed)
Anesthesia Post Note  Patient: Jonathan Chain Sr.  Procedure(s) Performed: Right Ilio- Femoral Artery Endarterectomy (Right Groin) Right Fifth Toe Ray Amputation (Right Foot) PATCH ANGIOPLASTY (Right Groin)     Patient location during evaluation: PACU Anesthesia Type: General Level of consciousness: sedated and patient cooperative Pain management: pain level controlled Vital Signs Assessment: post-procedure vital signs reviewed and stable Respiratory status: spontaneous breathing Cardiovascular status: stable Anesthetic complications: no Comments: Potassium found to be >6 in OR. Repeat lab sent. Found to be 6.8 in PACU. Discussed with Dr. Stanford Breed. They will contact nephrology for dialysis. Insulin and D50 were given in PACU and repeat K was 4.9.   No complications documented.  Last Vitals:  Vitals:   08/06/20 1730 08/06/20 1745  BP: (!) 142/65 (!) 100/59  Pulse: 84 85  Resp: 13 14  Temp: 36.4 C   SpO2: 100% 100%    Last Pain:  Vitals:   08/06/20 1745  TempSrc:   PainSc: 0-No pain                 Nolon Nations

## 2020-08-06 NOTE — Anesthesia Preprocedure Evaluation (Addendum)
Anesthesia Evaluation  Patient identified by MRN, date of birth, ID band Patient awake    Reviewed: Allergy & Precautions, NPO status , Patient's Chart, lab work & pertinent test results  Airway Mallampati: II  TM Distance: >3 FB Neck ROM: Full    Dental  (+) Dental Advisory Given   Pulmonary neg pulmonary ROS, former smoker,    Pulmonary exam normal breath sounds clear to auscultation       Cardiovascular hypertension, + Past MI and + Peripheral Vascular Disease  Normal cardiovascular exam Rhythm:Regular Rate:Normal  Echo 09/2019 1. Left ventricular ejection fraction, by estimation, is 30 to 35%. The left ventricle has severely decreased function. The left ventrical demonstrates regional wall motion abnormalities (see scoring diagram/findings for description). There is moderately increased left ventricular hypertrophy. Left ventricular diastolic function could not be evaluated. There is severe akinesis of the left ventricular, entire apical segment.  2. Right ventricular systolic function is normal. The right ventricular size is normal.  3. No left atrial/left atrial appendage thrombus was detected.  4. The mitral valve is normal in structure and function. Mild mitral valve regurgitation. No evidence of mitral stenosis.  5. The aortic valve is tricuspid. Aortic valve regurgitation is trivial. No aortic stenosis is present.  6. There is mild (Grade II) plaque involving the descending aorta.  7. Agitated saline contrast bubble study was negative, with no evidence of any interatrial shunt.    Neuro/Psych CVA    GI/Hepatic negative GI ROS, (+) Hepatitis -  Endo/Other  negative endocrine ROSdiabetes  Renal/GU Renal disease     Musculoskeletal negative musculoskeletal ROS (+)   Abdominal   Peds  Hematology  (+) Blood dyscrasia, anemia ,   Anesthesia Other Findings   Reproductive/Obstetrics                            Anesthesia Physical Anesthesia Plan  ASA: IV  Anesthesia Plan: General   Post-op Pain Management:    Induction: Intravenous  PONV Risk Score and Plan: 3 and Ondansetron, Dexamethasone and Treatment may vary due to age or medical condition  Airway Management Planned: Oral ETT  Additional Equipment: Arterial line  Intra-op Plan:   Post-operative Plan: Extubation in OR  Informed Consent: I have reviewed the patients History and Physical, chart, labs and discussed the procedure including the risks, benefits and alternatives for the proposed anesthesia with the patient or authorized representative who has indicated his/her understanding and acceptance.     Dental advisory given  Plan Discussed with: CRNA  Anesthesia Plan Comments:       Anesthesia Quick Evaluation

## 2020-08-06 NOTE — Anesthesia Procedure Notes (Signed)
Arterial Line Insertion Start/End12/30/2021 1:10 PM, 08/06/2020 1:20 PM Performed by: Nolon Nations, MD  Patient location: Pre-op. Preanesthetic checklist: patient identified, IV checked, site marked, risks and benefits discussed, surgical consent, monitors and equipment checked, pre-op evaluation, timeout performed and anesthesia consent Lidocaine 1% used for infiltration Left, radial was placed Catheter size: 20 Fr Hand hygiene performed  and maximum sterile barriers used   Attempts: 2 Procedure performed using ultrasound guided technique. Ultrasound Notes:anatomy identified, needle tip was noted to be adjacent to the nerve/plexus identified and no ultrasound evidence of intravascular and/or intraneural injection Following insertion, dressing applied and Biopatch. Post procedure assessment: normal and unchanged

## 2020-08-06 NOTE — Progress Notes (Signed)
Aware that K is 6.8 this afternoon - will dialyze tonight.   Veneta Penton, PA-C [employer] Pager [phone]

## 2020-08-06 NOTE — Progress Notes (Signed)
Dr. Stanford Breed called regarding heparin gtt. Instructed to turn heparin gtt off at this time. Orders received and carried out.

## 2020-08-07 ENCOUNTER — Inpatient Hospital Stay (HOSPITAL_COMMUNITY): Payer: Medicare Other

## 2020-08-07 ENCOUNTER — Encounter (HOSPITAL_COMMUNITY): Payer: Self-pay | Admitting: Vascular Surgery

## 2020-08-07 DIAGNOSIS — Z9889 Other specified postprocedural states: Secondary | ICD-10-CM

## 2020-08-07 LAB — POCT I-STAT 7, (LYTES, BLD GAS, ICA,H+H)
Acid-Base Excess: 5 mmol/L — ABNORMAL HIGH (ref 0.0–2.0)
Bicarbonate: 28.7 mmol/L — ABNORMAL HIGH (ref 20.0–28.0)
Calcium, Ion: 1.04 mmol/L — ABNORMAL LOW (ref 1.15–1.40)
HCT: 25 % — ABNORMAL LOW (ref 39.0–52.0)
Hemoglobin: 8.5 g/dL — ABNORMAL LOW (ref 13.0–17.0)
O2 Saturation: 100 %
Patient temperature: 36.8
Potassium: 6.6 mmol/L (ref 3.5–5.1)
Sodium: 138 mmol/L (ref 135–145)
TCO2: 30 mmol/L (ref 22–32)
pCO2 arterial: 37.5 mmHg (ref 32.0–48.0)
pH, Arterial: 7.49 — ABNORMAL HIGH (ref 7.350–7.450)
pO2, Arterial: 269 mmHg — ABNORMAL HIGH (ref 83.0–108.0)

## 2020-08-07 LAB — FOLATE: Folate: 18.5 ng/mL (ref >5.9)

## 2020-08-07 LAB — RETICULOCYTES
Immature Retic Fract: 8.5 % (ref 2.3–15.9)
RBC.: 2.44 MIL/uL — ABNORMAL LOW (ref 4.22–5.81)
Retic Count, Absolute: 37.8 10*3/uL (ref 19.0–186.0)
Retic Ct Pct: 1.6 % (ref 0.4–3.1)

## 2020-08-07 LAB — BASIC METABOLIC PANEL
Anion gap: 14 (ref 5–15)
Anion gap: 12 (ref 5–15)
BUN: 56 mg/dL — ABNORMAL HIGH (ref 8–23)
BUN: 45 mg/dL — ABNORMAL HIGH (ref 8–23)
CO2: 24 mmol/L (ref 22–32)
CO2: 27 mmol/L (ref 22–32)
Calcium: 8.4 mg/dL — ABNORMAL LOW (ref 8.9–10.3)
Calcium: 8.4 mg/dL — ABNORMAL LOW (ref 8.9–10.3)
Chloride: 101 mmol/L (ref 98–111)
Chloride: 102 mmol/L (ref 98–111)
Creatinine, Ser: 8.38 mg/dL — ABNORMAL HIGH (ref 0.61–1.24)
Creatinine, Ser: 6.96 mg/dL — ABNORMAL HIGH (ref 0.61–1.24)
GFR, Estimated: 6 mL/min — ABNORMAL LOW (ref >60)
GFR, Estimated: 8 mL/min — ABNORMAL LOW (ref >60)
Glucose, Bld: 102 mg/dL — ABNORMAL HIGH (ref 70–99)
Glucose, Bld: 88 mg/dL (ref 70–99)
Potassium: 6.6 mmol/L (ref 3.5–5.1)
Potassium: 4.8 mmol/L (ref 3.5–5.1)
Sodium: 139 mmol/L (ref 135–145)
Sodium: 141 mmol/L (ref 135–145)

## 2020-08-07 LAB — RENAL FUNCTION PANEL
Albumin: 3 g/dL — ABNORMAL LOW (ref 3.5–5.0)
Anion gap: 12 (ref 5–15)
BUN: 26 mg/dL — ABNORMAL HIGH (ref 8–23)
CO2: 27 mmol/L (ref 22–32)
Calcium: 8.2 mg/dL — ABNORMAL LOW (ref 8.9–10.3)
Chloride: 97 mmol/L — ABNORMAL LOW (ref 98–111)
Creatinine, Ser: 4.87 mg/dL — ABNORMAL HIGH (ref 0.61–1.24)
GFR, Estimated: 12 mL/min — ABNORMAL LOW (ref >60)
Glucose, Bld: 206 mg/dL — ABNORMAL HIGH (ref 70–99)
Phosphorus: 4.7 mg/dL — ABNORMAL HIGH (ref 2.5–4.6)
Potassium: 4.2 mmol/L (ref 3.5–5.1)
Sodium: 136 mmol/L (ref 135–145)

## 2020-08-07 LAB — CBC
HCT: 25.2 % — ABNORMAL LOW (ref 39.0–52.0)
HCT: 21.2 % — ABNORMAL LOW (ref 39.0–52.0)
Hemoglobin: 7.8 g/dL — ABNORMAL LOW (ref 13.0–17.0)
Hemoglobin: 7 g/dL — ABNORMAL LOW (ref 13.0–17.0)
MCH: 32 pg (ref 26.0–34.0)
MCH: 33.2 pg (ref 26.0–34.0)
MCHC: 31 g/dL (ref 30.0–36.0)
MCHC: 33 g/dL (ref 30.0–36.0)
MCV: 103.3 fL — ABNORMAL HIGH (ref 80.0–100.0)
MCV: 100.5 fL — ABNORMAL HIGH (ref 80.0–100.0)
Platelets: 163 10*3/uL (ref 150–400)
Platelets: 154 10*3/uL (ref 150–400)
RBC: 2.44 MIL/uL — ABNORMAL LOW (ref 4.22–5.81)
RBC: 2.11 MIL/uL — ABNORMAL LOW (ref 4.22–5.81)
RDW: 19.8 % — ABNORMAL HIGH (ref 11.5–15.5)
RDW: 19.4 % — ABNORMAL HIGH (ref 11.5–15.5)
WBC: 8.2 10*3/uL (ref 4.0–10.5)
WBC: 7.7 10*3/uL (ref 4.0–10.5)
nRBC: 0 % (ref 0.0–0.2)
nRBC: 0 % (ref 0.0–0.2)

## 2020-08-07 LAB — IRON AND TIBC
Iron: 47 ug/dL (ref 45–182)
Saturation Ratios: 27 % (ref 17.9–39.5)
TIBC: 172 ug/dL — ABNORMAL LOW (ref 250–450)
UIBC: 125 ug/dL

## 2020-08-07 LAB — FERRITIN: Ferritin: 1009 ng/mL — ABNORMAL HIGH (ref 24–336)

## 2020-08-07 LAB — LIPID PANEL
Cholesterol: 91 mg/dL (ref 0–200)
HDL: 42 mg/dL (ref >40)
LDL Cholesterol: 43 mg/dL (ref 0–99)
Total CHOL/HDL Ratio: 2.2 RATIO
Triglycerides: 31 mg/dL (ref <150)
VLDL: 6 mg/dL (ref 0–40)

## 2020-08-07 LAB — POCT ACTIVATED CLOTTING TIME
Activated Clotting Time: 279 seconds
Activated Clotting Time: 237 seconds
Activated Clotting Time: 243 seconds

## 2020-08-07 LAB — VITAMIN B12: Vitamin B-12: 612 pg/mL (ref 180–914)

## 2020-08-07 LAB — GLUCOSE, CAPILLARY
Glucose-Capillary: 85 mg/dL (ref 70–99)
Glucose-Capillary: 197 mg/dL — ABNORMAL HIGH (ref 70–99)
Glucose-Capillary: 225 mg/dL — ABNORMAL HIGH (ref 70–99)
Glucose-Capillary: 235 mg/dL — ABNORMAL HIGH (ref 70–99)

## 2020-08-07 MED ORDER — DARBEPOETIN ALFA 100 MCG/0.5ML IJ SOSY
100.0000 ug | PREFILLED_SYRINGE | INTRAMUSCULAR | Status: DC
  Filled 2020-08-07: qty 0.5

## 2020-08-07 MED ORDER — DARBEPOETIN ALFA 100 MCG/0.5ML IJ SOSY
PREFILLED_SYRINGE | INTRAMUSCULAR | Status: AC
  Filled 2020-08-07: qty 0.5

## 2020-08-07 MED ORDER — DARBEPOETIN ALFA 100 MCG/0.5ML IJ SOSY: 100.0000 ug | PREFILLED_SYRINGE | INTRAMUSCULAR | Status: DC

## 2020-08-07 NOTE — CV Procedure (Signed)
ABI completed.  Results can be found under chart review under CV PROC. 08/07/2020 11:30 AM Keely Drennan RVT, RDMS

## 2020-08-07 NOTE — Progress Notes (Signed)
Patient to HD with transport. Report given to [facility] in dialysis.

## 2020-08-07 NOTE — Progress Notes (Addendum)
Progress Note    08/07/2020 8:30 AM 1 Day Post-Op  Subjective: He denies lower extremity pain or chest pain this morning no shortness of breath. His wife is at bedside.   Vitals:   08/07/20 0510 08/07/20 0803  BP: (!) 111/59 131/60  Pulse: 85 90  Resp: 13 16  Temp: 97.6 F (36.4 C) 98.4 F (36.9 C)  SpO2: 99% 98%    Physical Exam: Cardiac: Rate and rhythm regular  Lungs: Clear to auscultation bilaterally  Incisions: Right groin incision is well approximated. Much softer in the inferior aspect of his groin as compared to yesterday in PACU.  Extremities: Motor and sensation intact right foot. Surgical dressing dry and in place. Dopplerable posterior tibial and peroneal artery signals. Left groin puncture site without hematoma. Right upper arm AV fistula with good bruit and thrill. No bleeding.  Abdomen: Soft, nondistended  CBC    Component Value Date/Time   WBC 8.2 08/07/2020 0306   RBC 2.44 (L) 08/07/2020 0306   RBC 2.44 (L) 08/07/2020 0306   HGB 7.8 (L) 08/07/2020 0306   HCT 25.2 (L) 08/07/2020 0306   PLT 163 08/07/2020 0306   MCV 103.3 (H) 08/07/2020 0306   MCH 32.0 08/07/2020 0306   MCHC 31.0 08/07/2020 0306   RDW 19.8 (H) 08/07/2020 0306   LYMPHSABS 2.0 09/21/2019 0954   MONOABS 0.8 09/21/2019 0954   EOSABS 0.1 09/21/2019 0954   BASOSABS 0.0 09/21/2019 0954    BMET    Component Value Date/Time   NA 139 08/07/2020 0307   K 6.6 (HH) 08/07/2020 0307   CL 101 08/07/2020 0307   CO2 24 08/07/2020 0307   GLUCOSE 102 (H) 08/07/2020 0307   BUN 56 (H) 08/07/2020 0307   CREATININE 8.38 (H) 08/07/2020 0307   CALCIUM 8.4 (L) 08/07/2020 0307   GFRNONAA 6 (L) 08/07/2020 0307   GFRAA 13 (L) 09/22/2019 0733     Intake/Output Summary (Last 24 hours) at 08/07/2020 0830 Last data filed at 08/07/2020 0752 Gross per 24 hour  Intake 1340 ml  Output 1311 ml  Net 29 ml    HOSPITAL MEDICATIONS Scheduled Meds: . aspirin  81 mg Oral Daily  . atorvastatin  40  mg Oral q1800  . calcitRIOL  1.75 mcg Oral Once per day on Mon Wed Fri  . Chlorhexidine Gluconate Cloth  6 each Topical Q0600  . clopidogrel  75 mg Oral Q breakfast  . darbepoetin (ARANESP) injection - DIALYSIS  100 mcg Intravenous Q Fri-HD  . docusate sodium  100 mg Oral Daily  . insulin aspart  0-9 Units Subcutaneous TID WC  . lidocaine-prilocaine  1 application Topical Once per day on Mon Wed Fri  . mupirocin ointment  1 application Nasal BID  . pantoprazole  40 mg Oral Daily  . predniSONE  5 mg Oral Q breakfast  . senna-docusate  1 tablet Oral Daily  . sertraline  100 mg Oral QHS  . sevelamer carbonate  800 mg Oral TID WC  . sodium chloride flush  3 mL Intravenous Q12H   Continuous Infusions: . sodium chloride    . magnesium sulfate bolus IVPB     PRN Meds:.sodium chloride, acetaminophen, bisacodyl, feeding supplement, guaiFENesin-dextromethorphan, hydrALAZINE, HYDROmorphone (DILAUDID) injection, labetalol, levalbuterol, magnesium sulfate bolus IVPB, metoCLOPramide, metoprolol tartrate, ondansetron (ZOFRAN) IV, oxyCODONE-acetaminophen, phenol, polyethylene glycol, potassium chloride  Assessment:   POD 1 right iliofemoral endarterectomy and right fifth toe amputation.  He was noted to have mild expanding right groin hematoma in the PACU  and pressure was held.  This was monitored and stable.  Additionally, serum potassium was noted to be elevated to 6.8 and IV glucose administered. Nephrology alerted and he underwent hemodialysis last night.  VSS. Afebrile.  12/29 right SFA Eluvia stent placement  RLE well perfused.  On chronic Coumadin therapy with history of CVA. His wife states "he had a heart attack many years ago and they put him on warfarin".  Was hospitalized in January of this year for Covid pneumonia, but she does not recall being told he had DVT/PE.  End-stage renal disease on chronic intermittent hemodialysis.  Nephrology following  Anemia: multiactorial: acute blood loss  anemia, chronic kidney disease.  Hemoglobin at 0300 was 7.8.  Plan: -Continue aspirin, statin, Plavix. Likely restart heparin infusion this morning. -Take down left foot dressing tomorrow.  Risa Grill, PA-C [facility] [phone] 08/07/2020  8:30 AM   Small non focal hematoma right groin non pulsatile Will delay heparin until tomorrow with warfarin Agree with above.  Ruta Hinds, MD [facility] [place] Office: [phone]

## 2020-08-07 NOTE — Progress Notes (Signed)
CRITICAL VALUE ALERT  Critical Value: K+ 6.6  Date & Time Notied: 08/07/20 0427  Provider Notified: Dr. Donzetta Matters  Orders Received/Actions taken: No new orders. Pt is still undergoing dialysis at this time.  Will continue to monitor. Adella Hare, RN

## 2020-08-07 NOTE — Progress Notes (Signed)
PT Cancellation Note  Patient Details Name: Jonathan Patras Sr. MRN: [id number] DOB: [birth year]   Cancelled Treatment:    Reason Eval/Treat Not Completed: Patient at procedure or test/unavailable (In am, pt going to Vascular lab and pm, pt was in HD. Will evaluate tomorrow if able.)   Denice Paradise 08/07/2020, 4:27 PM Gurnoor Ursua W,PT Acute Rehabilitation Services Pager:  [phone]  Office:  [phone]

## 2020-08-07 NOTE — Evaluation (Signed)
Occupational Therapy Evaluation Patient Details Name: Jonathan Weinreb Sr. MRN: [id number] DOB: 11/21/1950 Today's Date: 08/07/2020    History of Present Illness Jonathan Whalin. is a 69 y.o. male admitted for evaluation of right lateral fifth toe ulcer which has been present for >5 months.  Patient's history is significant for end-stage renal disease on HD MWF (access created by [facility]), DM2 (A1c 6.5), stroke for which he is maintained on chronic warfarin therapy.  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   Clinical Impression   Patient admitted for the above diagnosis and subsequent procedure.  He is not complaining of any pain.  He is at baseline for ADL/IADL, but presents with unsteadiness and a mild posterior lean.  PT eval is pending, and the mobility tech will be walking him as well.  Given the level of support he receives via family for ADLs and IADLs, there are no pressing acute OT needs.  Mobility will be a key for his return home.  Patient has baseline cognitive dysfunction, but is following commands well.  Spouse is open to [facility] PT if needed.  Patient was slightly orthostatic with supine to sit, he dropped 13 points with SBP: 131 in supine to 118 in sit.  In stand his BP was 115/105, but he was moving his arm.  Dizziness with sitting, but resolved in stand.  HGB is 7.8.  Session cleared with nursing due to potassium 6.6 - RN stated that was redrawn.      Follow Up Recommendations  No OT follow up    Equipment Recommendations  None recommended by OT    Recommendations for Other Services       Precautions / Restrictions Precautions Precautions: Fall Restrictions Weight Bearing Restrictions: No Other Position/Activity Restrictions: Surgical shoe has not been delivered to the room as of yet.      Mobility Bed Mobility Overal bed mobility: Needs Assistance Bed Mobility: Supine to Sit;Sit to Supine     Supine to sit: Supervision Sit to supine: Supervision         Transfers Overall transfer level: Needs assistance   Transfers: Sit to/from Stand Sit to Stand: Min guard;From elevated surface         General transfer comment: Able to walk to/from bathroom with [employer].    Balance Overall balance assessment: Needs assistance Sitting-balance support: No upper extremity supported Sitting balance-Leahy Scale: Fair     Standing balance support: Bilateral upper extremity supported Standing balance-Leahy Scale: Poor                             ADL either performed or assessed with clinical judgement   ADL Overall ADL's : At baseline                                             Vision Baseline Vision/History: Wears glasses Wears Glasses: At all times Patient Visual Report: No change from baseline       Perception     Praxis      Pertinent Vitals/Pain Pain Assessment: No/denies pain     Hand Dominance Right   Extremity/Trunk Assessment Upper Extremity Assessment Upper Extremity Assessment: Generalized weakness   Lower Extremity Assessment Lower Extremity Assessment: Defer to PT evaluation   Cervical / Trunk Assessment Cervical / Trunk Assessment: Kyphotic   Communication Communication  Communication: Expressive difficulties   Cognition Arousal/Alertness: Awake/alert Behavior During Therapy: Flat affect Overall Cognitive Status: History of cognitive impairments - at baseline                                     General Comments   O2 100% on RA    Exercises     Shoulder Instructions      Home Living Family/patient expects to be discharged to:: Private residence Living Arrangements: Spouse/significant other Available Help at Discharge: Family;Available 24 hours/day Type of Home: House Home Access: Stairs to enter [employer] of Steps: 1   Home Layout: Two level;Bed/bath upstairs;1/2 bath on main level Alternate Level Stairs-Number of Steps:  12 Alternate Level Stairs-Rails: Right Bathroom Shower/Tub: Teacher, early years/pre: Standard     Home Equipment: Cane - single point;Bedside commode;Tub bench;Wheelchair - [employer] - 4 wheels          Prior Functioning/Environment Level of Independence: Needs assistance  Gait / Transfers Assistance Needed: Patient uses 2wRW on the second floor, SPC and HHA to the car, w/c long distances, and has a PFRW for the main level. ADL's / Homemaking Assistance Needed: Wife manages meds, IADLs, baths and dresses him.  He does not praticipate with meals or home management. Communication / Swallowing Assistance Needed: WFL Comments: Baseline cognitive dysfunction        OT Problem List: Impaired balance (sitting and/or standing);Decreased cognition      OT Treatment/Interventions:      OT Goals(Current goals can be found in the care plan section) Acute Rehab OT Goals Patient Stated Goal: Wife is hoping he can return home.  Is open to [facility] PT if needed OT Goal Formulation: With patient/family Time For Goal Achievement: 08/07/20 Potential to Achieve Goals: Good  OT Frequency:     Barriers to D/C:  none          Co-evaluation              AM-PAC OT "6 Clicks" Daily Activity     Outcome Measure Help from another person eating meals?: None Help from another person taking care of personal grooming?: A Little Help from another person toileting, which includes using toliet, bedpan, or urinal?: A Lot Help from another person bathing (including washing, rinsing, drying)?: A Lot Help from another person to put on and taking off regular upper body clothing?: A Lot Help from another person to put on and taking off regular lower body clothing?: A Lot 6 Click Score: 15   End of Session Equipment Utilized During Treatment: Rolling walker Nurse Communication: Mobility status  Activity Tolerance: Patient tolerated treatment well Patient left: in bed;with call bell/phone  within reach;with bed alarm set;with family/visitor present  OT Visit Diagnosis: Unsteadiness on feet (R26.81)                Time: 1103-1594 OT Time Calculation (min): 29 min Charges:  OT General Charges $OT Visit: 1 Visit OT Evaluation $OT Eval Moderate Complexity: 1 Mod OT Treatments $Self Care/Home Management : 8-22 mins  08/07/2020  Rich, OTR/L  Acute Rehabilitation Services  Office:  [phone]   Jonathan [name] 08/07/2020, 12:23 PM

## 2020-08-07 NOTE — Op Note (Signed)
DATE OF SERVICE: 08/06/20  PATIENT:  Jonathan Alexandria Sr.  69 y.o. male  PRE-OPERATIVE DIAGNOSIS:  peripheral artery disease with ulceration  POST-OPERATIVE DIAGNOSIS:  peripheral artery disease with ulceration  PROCEDURE:   Right iliofemoral endarterectomy with bovine pericardial patch angioplasty Right fifth toe ray amputation  SURGEON:  Surgeon(s) and Role:    * [name], MD - Primary  ASSISTANT: Leontine Locket, PA-C  An assistant was required to facilitate exposure and expedite the case.  ANESTHESIA:   general  EBL: 162mL  BLOOD ADMINISTERED:none  DRAINS: none   LOCAL MEDICATIONS USED:  NONE  SPECIMEN:  none  COUNTS: confirmed correct.  TOURNIQUET:  * No tourniquets in log *  PATIENT DISPOSITION:  PACU - hemodynamically stable.   Delay start of Pharmacological VTE agent (>24hrs) due to surgical blood loss or risk of bleeding: no  INDICATION FOR PROCEDURE: Jonathan Riordan Sr. is a 69 y.o. male with critical limb ischemia of the right lower extremity. He has MRI evidence of right fifth toe osteomyelitis. On 08/05/20 I performed a right lower extremity angiogram to evaluate him for revascularization options. He had diffuse right SFA lesion which I stented. He had severe right femoral plaque. After careful discussion of risks, benefits, and alternatives the patient was offered right femoral endarterectomy and right fifth toe ray amputation. The patient understood and wished to proceed.  OPERATIVE FINDINGS: Severe plaque extending from distal external iliac to femoral artery bifurcation. Severe osteomyelitis in the right fifth metatarsal completely excised.  DESCRIPTION OF PROCEDURE: After identification of the patient in the pre-operative holding area, the patient was transferred to the operating room. The patient was positioned supine on the operating room table. Anesthesia was induced. The right lower extremity was prepped and draped in standard fashion. A surgical pause  was performed confirming correct patient, procedure, and operative location.  An oblique incision was made over the expected course of the common femoral artery. Incision was carried down through subcutaneous tissue until the femoral sheath was encountered. The femoral vessels were exposed and encircled with Silastic Vesseloops. The plaque extended to under the inguinal ligament. I mobilized the inguinal ligament releasing its lateral attachments to avoid creating a femoral hernia. We extended our exposure into the distal external iliac artery where we found a soft segment of artery. The artery was encircled here. The superficial femoral artery and profunda femoris artery were exposed.  Patient was systemically heparinized. Clamps were applied to the external iliac, profunda femoris, superficial femoral artery. An anterior arteriotomy was made with 11 blade. This was extended with large Potts scissors. Severe, ulcerated plaque was encountered extending from the distal external iliac artery to the femoral artery bifurcation. Standard technique was used to develop an endarterectomy plane and endarterectomized the diseased surface. Given the length of the lesion a bovine pericardial patch was necessary to bridge the gap. This was brought onto the field. The patch was sewn into the artery using continuous running technique with 5-0 Prolene suture. The patch was flushed and anastomosis completed. The clamps were released and hemostasis was assured. Several points of repair were needed. Doppler flow in the foot was interrogated and found to be brisk. The groin was irrigated and closed in layers using 2-0 Vicryl, 3-0 Vicryl, 4-0 Monocryl  Incision was made over the right fourth / fifth webspace and extended to form a gentle ellipse around the course of the right fifth metatarsal. Subcutaneous flaps were created dorsally and plantarly. Severe osteomyelitis and degeneration of the cortex was  encountered in the distal  metatarsal. I carried my exposure proximally on the metatarsal until healthy cortex was encountered. All of the diseased bone was excised using a bone cutter. The wound was copiously irrigated. The wound was closed in layers using 2-0 Vicryl, 3-0 nylon, and surgical stapler.  Upon completion of the case instrument and sharps counts were confirmed correct. The patient was transferred to the PACU in good condition. I was present for all portions of the procedure.  [name], MD [facility] Phone Number: [phone] 08/07/2020 10:34 AM

## 2020-08-07 NOTE — Progress Notes (Signed)
This patient did not tolerate HD today due to an episode of hypotension and brief unresponsiveness. Immediate fluid replacement proved effective and patient became stable again. We were unable to achieve the ordered UF goal due to symptomatic hypotension. Vital signs are stable post rinse back. Patient has been discharged back to unit 4E in stable condition. Dr. Joylene Grapes was called and made aware of the brief episode and the status of the UF goal.

## 2020-08-07 NOTE — Progress Notes (Signed)
Pt returned from dialysis. Dialysis nurse reported that pt's R groin site started bleeding during dialysis but that after holding pressure, bleeding stopped. Site is no longer bleeding and the hematoma did not grow outside of previously marked area. Dialysis nurse also stated that pts BP dropped as low as 74/52 (59). BP now 111/59 (74). HR 85. RR 13. SpO2 99 on room air. Pt denies any pain and is resting comfortably in his room. Will continue to monitor.   Adella Hare, RN

## 2020-08-07 NOTE — Progress Notes (Signed)
  [facility] Progress Note   Subjective:  Seen in room, no c/o's, wife at bedside. Had urgent HD last night for ^^K+ 6.8.  Repeat pending this am.   Objective Vitals:   08/07/20 0330 08/07/20 0510 08/07/20 0640 08/07/20 0803  BP: (!) 110/57 (!) 111/59  131/60  Pulse: 89 85  90  Resp: 20 13  16   Temp:  97.6 F (36.4 C)  98.4 F (36.9 C)  TempSrc:  Oral  Oral  SpO2: 99% 99%  98%  Weight:   65.7 kg   Height:       Physical Exam General: Well appearing man, NAD. Pleasantly demented. Heart: RRR; no murmur Lungs: CTA anteriorly Abdomen: soft, non-tender Extremities: No LE edema, R foot in sock - not examined Dialysis Access: RUE AVF  Additional Objective Labs: Basic Metabolic Panel: Recent Labs  Lab 08/06/20 1535 08/06/20 1652 08/07/20 0307  NA 139 140 139  K 6.8* 4.9 6.6*  CL 104 103 101  CO2 24 25 24   GLUCOSE 82 142* 102*  BUN 49* 51* 56*  CREATININE 8.04* 8.05* 8.38*  CALCIUM 8.2* 8.3* 8.4*   CBC: Recent Labs  Lab 08/06/20 0050 08/06/20 1508 08/07/20 0306  WBC 5.4  --  8.2  HGB 9.5* 8.5* 7.8*  HCT 29.7* 25.0* 25.2*  MCV 99.0  --  103.3*  PLT 181  --  163   Studies/Results: No results found. Medications: . sodium chloride    . magnesium sulfate bolus IVPB     . aspirin  81 mg Oral Daily  . atorvastatin  40 mg Oral q1800  . calcitRIOL  1.75 mcg Oral Once per day on Mon Wed Fri  . Chlorhexidine Gluconate Cloth  6 each Topical Q0600  . clopidogrel  75 mg Oral Q breakfast  . darbepoetin (ARANESP) injection - DIALYSIS  100 mcg Intravenous Q Fri-HD  . docusate sodium  100 mg Oral Daily  . insulin aspart  0-9 Units Subcutaneous TID WC  . lidocaine-prilocaine  1 application Topical Once per day on Mon Wed Fri  . mupirocin ointment  1 application Nasal BID  . pantoprazole  40 mg Oral Daily  . predniSONE  5 mg Oral Q breakfast  . senna-docusate  1 tablet Oral Daily  . sertraline  100 mg Oral QHS  . sevelamer carbonate  800 mg Oral TID WC   . sodium chloride flush  3 mL Intravenous Q12H    Dialysis: MWF at [facility] -- last HD 07/28/20 4h 24min   400/800   65.5kg   2K/2Ca   RUE AVF   Heparin 3000  - Venofer 50mg  IV q week - Mircera 91mcg IV q 2 weeks - Calcitriol 1.61mcg PO q HD  Assessment/Plan: 1.  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 2.  ESRD: Had missed over 1 week HD on admit - s/p HD here 12/29 and last night 12/30 for ^^K+. Has had his 3 HD this week. Rechecking K+ this am. Possible short HD tomorrow vs wait until Monday to get back on schedule.  3.  Hypertension/volume: BP's low now, at dry wt, no vol ^ on exam.  4.  Anemia: Hgb 9.5 >> 7's today- will redose ESA today sq 100ug. . 5.  Metabolic bone disease: Ca ok/Phos pending, continue Renvela/VDRA. 6.  T2DM 7.  Hx CVA/vascular dementia   Kelly Splinter, MD 08/07/2020, 10:07 AM

## 2020-08-08 LAB — PREPARE RBC (CROSSMATCH)

## 2020-08-08 LAB — GLUCOSE, CAPILLARY
Glucose-Capillary: 129 mg/dL — ABNORMAL HIGH (ref 70–99)
Glucose-Capillary: 373 mg/dL — ABNORMAL HIGH (ref 70–99)
Glucose-Capillary: 68 mg/dL — ABNORMAL LOW (ref 70–99)
Glucose-Capillary: 219 mg/dL — ABNORMAL HIGH (ref 70–99)

## 2020-08-08 LAB — CBC
HCT: 21.1 % — ABNORMAL LOW (ref 39.0–52.0)
Hemoglobin: 6.9 g/dL — CL (ref 13.0–17.0)
MCH: 32.7 pg (ref 26.0–34.0)
MCHC: 32.7 g/dL (ref 30.0–36.0)
MCV: 100 fL (ref 80.0–100.0)
Platelets: 146 10*3/uL — ABNORMAL LOW (ref 150–400)
RBC: 2.11 MIL/uL — ABNORMAL LOW (ref 4.22–5.81)
RDW: 18.8 % — ABNORMAL HIGH (ref 11.5–15.5)
WBC: 9.2 10*3/uL (ref 4.0–10.5)
nRBC: 0 % (ref 0.0–0.2)

## 2020-08-08 MED ORDER — SODIUM CHLORIDE 0.9% IV SOLUTION: Freq: Once | INTRAVENOUS | Status: DC

## 2020-08-08 MED ORDER — DARBEPOETIN ALFA 100 MCG/0.5ML IJ SOSY
100.0000 ug | PREFILLED_SYRINGE | Freq: Once | INTRAMUSCULAR | Status: AC
  Administered 2020-08-08: 100 ug via SUBCUTANEOUS
  Filled 2020-08-08: qty 0.5

## 2020-08-08 NOTE — Progress Notes (Addendum)
[facility] Progress Note   Subjective:  Seen in room, doing well, didn't tolerate UF w/ HD yest afternoon.    Objective Vitals:   08/07/20 2305 08/08/20 0425 08/08/20 0702 08/08/20 1108  BP: (!) 111/54 (!) 112/59 107/64 115/61  Pulse: 90 94 90 100  Resp: 10 20 15 18   Temp: 98.6 F (37 C) 99.3 F (37.4 C) 98 F (36.7 C) 98.8 F (37.1 C)  TempSrc: Oral Oral Oral Oral  SpO2: 100% 100% 100% 100%  Weight:  65.5 kg    Height:       Physical Exam General: Well appearing man, NAD. Pleasantly demented. Heart: RRR; no murmur Lungs: CTA anteriorly Abdomen: soft, non-tender Extremities: No LE edema, R foot in sock - not examined Dialysis Access: RUE AVF  Dialysis: MWF at [facility] -- last HD 07/28/20 4h 69min   400/800   65.5kg   2K/2Ca   RUE AVF   Heparin 3000  - Venofer 50mg  IV q week - Mircera 38mcg IV q 2 weeks - Calcitriol 1.24mcg PO q HD  Assessment/Plan: 1.  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 Per [facility] 2.  ESRD: Had missed over 1 week HD on admit. Had HD here the last 3 days in a row. Next HD Monday.  3.  Hypertension/volume: at dry wt, BP's low normal, no vol excess  4.  Anemia: Hgb 9.5 > 7 > 6.9 today. 2u prbc ordered by primary team.  Reordered sq ESA 100ug for today.  5.  Metabolic bone disease: Ca ok/Phos pending, continue Renvela/VDRA. 6.  T2DM 7.  Hx CVA/vascular dementia   Kelly Splinter, MD 08/08/2020, 12:02 PM  Additional Objective Labs: Basic Metabolic Panel: Recent Labs  Lab 08/06/20 1652 08/07/20 0307 08/07/20 1453  NA 140 139 136  K 4.9 6.6* 4.2  CL 103 101 97*  CO2 25 24 27   GLUCOSE 142* 102* 206*  BUN 51* 56* 26*  CREATININE 8.05* 8.38* 4.87*  CALCIUM 8.3* 8.4* 8.2*  PHOS  --   --  4.7*   CBC: Recent Labs  Lab 08/06/20 0050 08/06/20 1508 08/07/20 0306 08/07/20 1453 08/08/20 0704  WBC 5.4  --  8.2 7.7 9.2  HGB 9.5*   < > 7.8* 7.0* 6.9*  HCT 29.7*   < > 25.2* 21.2* 21.1*   MCV 99.0  --  103.3* 100.5* 100.0  PLT 181  --  163 154 146*   < > = values in this interval not displayed.    Medications: . sodium chloride    . magnesium sulfate bolus IVPB     . sodium chloride   Intravenous Once  . aspirin  81 mg Oral Daily  . atorvastatin  40 mg Oral q1800  . calcitRIOL  1.75 mcg Oral Once per day on Mon Wed Fri  . Chlorhexidine Gluconate Cloth  6 each Topical Q0600  . clopidogrel  75 mg Oral Q breakfast  . darbepoetin (ARANESP) injection - DIALYSIS  100 mcg Intravenous Q Fri-HD  . docusate sodium  100 mg Oral Daily  . insulin aspart  0-9 Units Subcutaneous TID WC  . lidocaine-prilocaine  1 application Topical Once per day on Mon Wed Fri  . mupirocin ointment  1 application Nasal BID  . pantoprazole  40 mg Oral Daily  . predniSONE  5 mg Oral Q breakfast  . senna-docusate  1 tablet Oral Daily  . sertraline  100 mg Oral QHS  . sevelamer  carbonate  800 mg Oral TID WC  . sodium chloride flush  3 mL Intravenous Q12H

## 2020-08-08 NOTE — Progress Notes (Addendum)
Progress Note    08/08/2020 8:04 AM 2 Days Post-Op  Subjective:  He denies pain or SOB.  Attempted HD yesterday evening; however, patient experienced hypotension and brief unresponsiveness. HD aborted, fluid resusicated and return of stable VS.  Vitals:   08/07/20 2305 08/08/20 0425  BP: (!) 111/54 (!) 112/59  Pulse: 90 94  Resp: 10 20  Temp: 98.6 F (37 C) 99.3 F (37.4 C)  SpO2: 100% 100%    Physical Exam: Cardiac: Rate and rhythm regular  Lungs: Clear to auscultation bilaterally  Incisions: Right groin incision is well approximated. Remains soft with small, non-expanding hematoma.  Extremities: Motor and sensation intact right foot. Surgical dressing saturated with strike through to ACE wrap and onto bedsheets. Dressing removed. Trickle bleeding at mid-incision. Suture line intact. Dopplerable posterior tibial and peroneal artery signals. Right upper arm AV fistula with good bruit and thrill. No bleeding.  CBC    Component Value Date/Time   WBC 7.7 08/07/2020 1453   RBC 2.11 (L) 08/07/2020 1453   HGB 7.0 (L) 08/07/2020 1453   HCT 21.2 (L) 08/07/2020 1453   PLT 154 08/07/2020 1453   MCV 100.5 (H) 08/07/2020 1453   MCH 33.2 08/07/2020 1453   MCHC 33.0 08/07/2020 1453   RDW 19.4 (H) 08/07/2020 1453   LYMPHSABS 2.0 09/21/2019 0954   MONOABS 0.8 09/21/2019 0954   EOSABS 0.1 09/21/2019 0954   BASOSABS 0.0 09/21/2019 0954    BMET    Component Value Date/Time   NA 136 08/07/2020 1453   K 4.2 08/07/2020 1453   CL 97 (L) 08/07/2020 1453   CO2 27 08/07/2020 1453   GLUCOSE 206 (H) 08/07/2020 1453   BUN 26 (H) 08/07/2020 1453   CREATININE 4.87 (H) 08/07/2020 1453   CALCIUM 8.2 (L) 08/07/2020 1453   GFRNONAA 12 (L) 08/07/2020 1453   GFRAA 13 (L) 09/22/2019 0733     Intake/Output Summary (Last 24 hours) at 08/08/2020 0804 Last data filed at 08/07/2020 1805 Gross per 24 hour  Intake 0 ml  Output 1600 ml  Net -1600 ml    HOSPITAL MEDICATIONS Scheduled  Meds: . aspirin  81 mg Oral Daily  . atorvastatin  40 mg Oral q1800  . calcitRIOL  1.75 mcg Oral Once per day on Mon Wed Fri  . Chlorhexidine Gluconate Cloth  6 each Topical Q0600  . clopidogrel  75 mg Oral Q breakfast  . darbepoetin (ARANESP) injection - DIALYSIS  100 mcg Intravenous Q Fri-HD  . docusate sodium  100 mg Oral Daily  . insulin aspart  0-9 Units Subcutaneous TID WC  . lidocaine-prilocaine  1 application Topical Once per day on Mon Wed Fri  . mupirocin ointment  1 application Nasal BID  . pantoprazole  40 mg Oral Daily  . predniSONE  5 mg Oral Q breakfast  . senna-docusate  1 tablet Oral Daily  . sertraline  100 mg Oral QHS  . sevelamer carbonate  800 mg Oral TID WC  . sodium chloride flush  3 mL Intravenous Q12H   Continuous Infusions: . sodium chloride    . magnesium sulfate bolus IVPB     PRN Meds:.sodium chloride, acetaminophen, bisacodyl, feeding supplement, guaiFENesin-dextromethorphan, hydrALAZINE, HYDROmorphone (DILAUDID) injection, labetalol, levalbuterol, magnesium sulfate bolus IVPB, metoCLOPramide, metoprolol tartrate, ondansetron (ZOFRAN) IV, oxyCODONE-acetaminophen, phenol, polyethylene glycol   ABI/TBIToday's ABIToday's TBIPrevious ABIPrevious TBI  +-------+-----------+-----------+------------+------------+  Right       0           0.96      +-------+-----------+-----------+------------+------------+  Left  1.67    0.22          0.59      +-------+-----------+-----------+------------+------------+  Arterial wall calcification precludes accurate ankle pressures and ABIs.  ABI unobtainble due to non compressible vessels. Tbis are greatly  reduced/absent in comparison to previous study.    Assessment: POD 2 right iliofemoral endarterectomy and right fifth ray amputation. Mild bleeding from 5th ray amputation site. Clean,dry pressure dressing applied. Platelet count 146k He was noted to have mild  expanding right groin hematoma in the PACU and pressure was held.  This was monitored and stable. VSS. Afebrile. 12/29 right SFA Eluvia stent placement. RLE well perfused. ABIs: vessels non-compressible.  On chronic Coumadin therapy with history of CVA. His wife states "he had a heart attack many years ago and they put him on warfarin".  Was hospitalized in January of this year for Covid pneumonia, but she does not recall being told he had DVT/PE. Heparin infusion held post-op.  End-stage renal disease on chronic intermittent hemodialysis. Nephrology following.  Anemia: multiactorial: acute blood loss anemia, chronic kidney disease. Hemoglobin yesterday at 3pm was 7.0 down from 7.8 yesterday and 11.8 on admission. Receiving ESA.  Plan: -Continue aspirin, statin, Plavix. Continue to hold heparin infusion secondary to anemia and minor bleeding from amp site. -OK to mobilize. Continue with PT/OT eval   Risa Grill, PA-C [facility] [phone] 08/08/2020  8:04 AM   Agree with above.  Transfuse 2U RBC today for symptomatic acute blood loss anemia with hypotension  Continue to hold heparin/warfarin SCDs for DVT prophlaxis  Ruta Hinds, MD [facility] [place] Office: [phone]

## 2020-08-08 NOTE — Progress Notes (Signed)
Orthopedic Tech Progress Note Patient Details:  Lashaun Krapf Sr. 09/20/1950 [id number] Left shoe in patient room for OT Ortho Devices Type of Ortho Device: Darco shoe Ortho Device/Splint Location: RLE Ortho Device/Splint Interventions: Ordered   Post Interventions Patient Tolerated: Other (comment) Instructions Provided: Care of device,Adjustment of device,Poper ambulation with device   Jaena Brocato 08/08/2020, 3:32 PM

## 2020-08-08 NOTE — Progress Notes (Signed)
Mobility Specialist: Progress Note   08/08/20 1629  Mobility  Activity Ambulated in hall  Level of Assistance Minimal assist, patient does 75% or more  Assistive Device Front wheel walker  Distance Ambulated (ft) 96 ft  Mobility Response Tolerated fair  Mobility performed by Mobility specialist  Bed Position Chair  $Mobility charge 1 Mobility   Pre-Mobility: 85 HR, 132/63 BP, 100% SpO2 Post-Mobility: 96 HR, 141/57 BP, 100% SpO2  Pt bowel incontinent upon arrival. RN assisted with pericare. Pt ambulated in hallway using [place]. Pt presented with mild posterior lean and two episodes of LOB during ambulation, RN notified. Pt required frequent cues for hand and RW placement as well as to slow down. Pt unsteady during walk due to trying to move too fast. Pt back to chair after walk with call bell and phone at his side.   [facility] Jonathan [name] Mobility Specialist

## 2020-08-08 NOTE — Evaluation (Signed)
Physical Therapy Evaluation Patient Details Name: Jonathan [name] Sr. MRN: [id number] DOB: March 14, 1951 Today's Date: 08/08/2020   History of Present Illness  [place]. is a 70 y.o. male admitted for evaluation of right lateral fifth toe ulcer which has been present for >5 months.  Patient's history is significant for end-stage renal disease on HD MWF (access created by [facility]), DM2 (A1c 6.5), stroke for which he is maintained on chronic warfarin therapy.  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    Clinical Impression  Pt with flat affect but followed commands well. Pt tolerated ambulation well and denies pain. Pt functioning at [facility] for safety and impulsivity. Wife reports he is near his baseline level of function. Pt to benefit from HHPT to address safety and stair negotiation in the home with darco shoe. Acute PT to cont to follow.    Follow Up Recommendations Home health PT;Supervision/Assistance - 24 hour    Equipment Recommendations  None recommended by PT    Recommendations for Other Services       Precautions / Restrictions Precautions Precautions: Fall Restrictions Weight Bearing Restrictions: No Other Position/Activity Restrictions: Surgical shoe has not been delivered to the room as of yet.      Mobility  Bed Mobility Overal bed mobility: Needs Assistance Bed Mobility: Supine to Sit;Sit to Supine     Supine to sit: Supervision Sit to supine: Supervision   General bed mobility comments: HOB elevated, able to bring self to EOB wtihout physical assist    Transfers Overall transfer level: Needs assistance Equipment used: Rolling walker (2 wheeled) Transfers: Sit to/from Stand Sit to Stand: Min guard;From elevated surface         General transfer comment: verbal cues for safety  Ambulation/Gait Ambulation/Gait assistance: Min assist Gait Distance (Feet): 100 Feet Assistive device: Rolling walker (2 wheeled) Gait Pattern/deviations:  Step-through pattern;Decreased stride length;Trunk flexed Gait velocity: impulsively fast   General Gait Details: minA for walker management around obstacles and to keep walker close and not to far out in front, pt with no antalgia and denies pain in R foot  Stairs            Wheelchair Mobility    Modified Rankin (Stroke Patients Only)       Balance Overall balance assessment: Needs assistance Sitting-balance support: No upper extremity supported Sitting balance-Leahy Scale: Fair     Standing balance support: Bilateral upper extremity supported Standing balance-Leahy Scale: Fair Standing balance comment: needs RW for safe amb                             Pertinent Vitals/Pain Pain Assessment: No/denies pain    Home Living Family/patient expects to be discharged to:: Private residence Living Arrangements: Spouse/significant other Available Help at Discharge: Family;Available 24 hours/day Type of Home: House Home Access: Stairs to enter   [employer] of Steps: 1 Home Layout: Two level;Bed/bath upstairs;1/2 bath on main level Home Equipment: Cane - single point;Bedside commode;Tub bench;Wheelchair - [employer] - 4 wheels Additional Comments: history provided by wife    Prior Function Level of Independence: Needs assistance   Gait / Transfers Assistance Needed: Patient uses 2wRW on the second floor, SPC and HHA to the car, w/c long distances, and has a PFRW for the main level.  ADL's / Homemaking Assistance Needed: Wife manages meds, IADLs, baths and dresses him.  He does not praticipate with meals or home management.  Comments: pt with  impaired cognitition at baseline     Hand Dominance   Dominant Hand: Right    Extremity/Trunk Assessment   Upper Extremity Assessment Upper Extremity Assessment: Generalized weakness    Lower Extremity Assessment Lower Extremity Assessment: Generalized weakness    Cervical / Trunk  Assessment Cervical / Trunk Assessment: Kyphotic  Communication      Cognition Arousal/Alertness: Awake/alert Behavior During Therapy: Flat affect Overall Cognitive Status: History of cognitive impairments - at baseline                                 General Comments: wife reports pt can become impulsive and demonstrate decreased safety awareness and he also has difficulty comprehending new tasks. wife states he doesn't ever know the date      General Comments General comments (skin integrity, edema, etc.): vascular PA came in while PT starts and pulled back covers which revealed signifcant fresh blood drainage that socked through dressing and ace wrap, PA addressed    Exercises     Assessment/Plan    PT Assessment Patient needs continued PT services  PT Problem List Decreased strength;Decreased activity tolerance;Decreased balance;Decreased mobility;Decreased knowledge of use of DME;Decreased safety awareness       PT Treatment Interventions DME instruction;Gait training;Stair training;Functional mobility training;Therapeutic activities;Therapeutic exercise;Balance training    PT Goals (Current goals can be found in the Care Plan section)  Acute Rehab PT Goals Patient Stated Goal: Wife is hoping he can return home.  Is open to [facility] PT if needed PT Goal Formulation: With patient/family Time For Goal Achievement: 08/22/20 Potential to Achieve Goals: Good    Frequency Min 3X/week   Barriers to discharge        Co-evaluation               AM-PAC PT "6 Clicks" Mobility  Outcome Measure Help needed turning from your back to your side while in a flat bed without using bedrails?: None Help needed moving from lying on your back to sitting on the side of a flat bed without using bedrails?: None Help needed moving to and from a bed to a chair (including a wheelchair)?: A Little Help needed standing up from a chair using your arms (e.g., wheelchair or bedside  chair)?: A Little Help needed to walk in hospital room?: A Little Help needed climbing 3-5 steps with a railing? : A Lot 6 Click Score: 19    End of Session Equipment Utilized During Treatment: Gait belt Activity Tolerance: Patient tolerated treatment well Patient left: in chair;with call bell/phone within reach;with family/visitor present Nurse Communication: Mobility status PT Visit Diagnosis: Unsteadiness on feet (R26.81);Other abnormalities of gait and mobility (R26.89);Muscle weakness (generalized) (M62.81);Difficulty in walking, not elsewhere classified (R26.2)    Time: 9562-1308 PT Time Calculation (min) (ACUTE ONLY): 26 min   Charges:   PT Evaluation $PT Eval Moderate Complexity: 1 Mod PT Treatments $Gait Training: 8-22 mins        Kittie Plater, PT, DPT Acute Rehabilitation Services Pager #: [phone] Office #: [phone]   Berline Lopes 08/08/2020, 3:43 PM

## 2020-08-09 LAB — TYPE AND SCREEN
ABO/RH(D): O POS
Antibody Screen: NEGATIVE
Unit division: 0
Unit division: 0
Unit division: 0

## 2020-08-09 LAB — GLUCOSE, CAPILLARY
Glucose-Capillary: 126 mg/dL — ABNORMAL HIGH (ref 70–99)
Glucose-Capillary: 121 mg/dL — ABNORMAL HIGH (ref 70–99)
Glucose-Capillary: 207 mg/dL — ABNORMAL HIGH (ref 70–99)
Glucose-Capillary: 190 mg/dL — ABNORMAL HIGH (ref 70–99)

## 2020-08-09 LAB — BASIC METABOLIC PANEL
Anion gap: 12 (ref 5–15)
BUN: 26 mg/dL — ABNORMAL HIGH (ref 8–23)
CO2: 26 mmol/L (ref 22–32)
Calcium: 8.6 mg/dL — ABNORMAL LOW (ref 8.9–10.3)
Chloride: 99 mmol/L (ref 98–111)
Creatinine, Ser: 5.38 mg/dL — ABNORMAL HIGH (ref 0.61–1.24)
GFR, Estimated: 11 mL/min — ABNORMAL LOW (ref >60)
Glucose, Bld: 168 mg/dL — ABNORMAL HIGH (ref 70–99)
Potassium: 3.3 mmol/L — ABNORMAL LOW (ref 3.5–5.1)
Sodium: 137 mmol/L (ref 135–145)

## 2020-08-09 LAB — BPAM RBC
Blood Product Expiration Date: 202201212359
Blood Product Expiration Date: 202201212359
Blood Product Expiration Date: 202202022359
ISSUE DATE / TIME: 202201011154
ISSUE DATE / TIME: 202201011642
Unit Type and Rh: 5100
Unit Type and Rh: 5100
Unit Type and Rh: 5100

## 2020-08-09 LAB — CBC
HCT: 27 % — ABNORMAL LOW (ref 39.0–52.0)
Hemoglobin: 9.5 g/dL — ABNORMAL LOW (ref 13.0–17.0)
MCH: 32.8 pg (ref 26.0–34.0)
MCHC: 35.2 g/dL (ref 30.0–36.0)
MCV: 93.1 fL (ref 80.0–100.0)
Platelets: 140 10*3/uL — ABNORMAL LOW (ref 150–400)
RBC: 2.9 MIL/uL — ABNORMAL LOW (ref 4.22–5.81)
RDW: 19.9 % — ABNORMAL HIGH (ref 11.5–15.5)
WBC: 9.8 10*3/uL (ref 4.0–10.5)
nRBC: 0 % (ref 0.0–0.2)

## 2020-08-09 MED ORDER — WARFARIN - PHARMACIST DOSING INPATIENT: Freq: Every day | Status: DC

## 2020-08-09 MED ORDER — WARFARIN SODIUM 5 MG PO TABS
9.0000 mg | ORAL_TABLET | Freq: Once | ORAL | Status: AC
  Administered 2020-08-09: 9 mg via ORAL
  Filled 2020-08-09: qty 1

## 2020-08-09 NOTE — Progress Notes (Signed)
Mobility Specialist: Progress Note   08/09/20 1110  Mobility  Activity Ambulated in hall  Level of Assistance Minimal assist, patient does 75% or more  Assistive Device Front wheel walker  Distance Ambulated (ft) 130 ft  Mobility Response Tolerated well  Mobility performed by Mobility specialist  $Mobility charge 1 Mobility   Pre-Mobility: 82 HR, 123/55 BP, 100% SpO2 During Mobility: 104 HR Post-Mobility: 91 HR, 144/64 BP, 100% SpO2  Pt did much better today and said he feels good today. Pt performed ambulation with steadiness and no episodes of LOB. Pt needed less cues during ambulation today as well. Encouraged pt to walk at least 1-2 more times today.   [facility] Stephinie Battisti Mobility Specialist

## 2020-08-09 NOTE — Progress Notes (Addendum)
Progress Note    08/09/2020 7:08 AM 3 Days Post-Op  Subjective:  He denies LE pain, CP or SOB. Ambulated yesterday with multiple cues to slow pace. Maintain balance  Vitals:   08/08/20 2317 08/09/20 0333  BP: (!) 141/72 (!) 152/68  Pulse: 93 93  Resp: 18 15  Temp: 98.7 F (37.1 C) 98.8 F (37.1 C)  SpO2: 99% 99%    Physical Exam: Cardiac:Rate and rhythm regular  Lungs:Clear to auscultation bilaterally  Incisions:Right groin incision is well approximated. Remains soft with small, non-expanding hematoma.  Extremities:Motor and sensation intact right foot. Dressing removed. Suture line intact. No further bleeding. Dopplerable posterior tibial and peroneal artery signals. Left foot warm without skin changes.   CBC    Component Value Date/Time   WBC 9.8 08/09/2020 0225   RBC 2.90 (L) 08/09/2020 0225   HGB 9.5 (L) 08/09/2020 0225   HCT 27.0 (L) 08/09/2020 0225   PLT 140 (L) 08/09/2020 0225   MCV 93.1 08/09/2020 0225   MCH 32.8 08/09/2020 0225   MCHC 35.2 08/09/2020 0225   RDW 19.9 (H) 08/09/2020 0225   LYMPHSABS 2.0 09/21/2019 0954   MONOABS 0.8 09/21/2019 0954   EOSABS 0.1 09/21/2019 0954   BASOSABS 0.0 09/21/2019 0954    BMET    Component Value Date/Time   NA 137 08/09/2020 0225   K 3.3 (L) 08/09/2020 0225   CL 99 08/09/2020 0225   CO2 26 08/09/2020 0225   GLUCOSE 168 (H) 08/09/2020 0225   BUN 26 (H) 08/09/2020 0225   CREATININE 5.38 (H) 08/09/2020 0225   CALCIUM 8.6 (L) 08/09/2020 0225   GFRNONAA 11 (L) 08/09/2020 0225   GFRAA 13 (L) 09/22/2019 0733     Intake/Output Summary (Last 24 hours) at 08/09/2020 0708 Last data filed at 08/08/2020 1959 Gross per 24 hour  Intake 1335.5 ml  Output --  Net 1335.5 ml    HOSPITAL MEDICATIONS Scheduled Meds: . sodium chloride   Intravenous Once  . aspirin  81 mg Oral Daily  . atorvastatin  40 mg Oral q1800  . calcitRIOL  1.75 mcg Oral Once per day on Mon Wed Fri  . Chlorhexidine Gluconate Cloth  6 each  Topical Q0600  . clopidogrel  75 mg Oral Q breakfast  . darbepoetin (ARANESP) injection - DIALYSIS  100 mcg Intravenous Q Fri-HD  . docusate sodium  100 mg Oral Daily  . insulin aspart  0-9 Units Subcutaneous TID WC  . lidocaine-prilocaine  1 application Topical Once per day on Mon Wed Fri  . mupirocin ointment  1 application Nasal BID  . pantoprazole  40 mg Oral Daily  . predniSONE  5 mg Oral Q breakfast  . senna-docusate  1 tablet Oral Daily  . sertraline  100 mg Oral QHS  . sevelamer carbonate  800 mg Oral TID WC  . sodium chloride flush  3 mL Intravenous Q12H   Continuous Infusions: . sodium chloride    . magnesium sulfate bolus IVPB     PRN Meds:.sodium chloride, acetaminophen, bisacodyl, feeding supplement, guaiFENesin-dextromethorphan, hydrALAZINE, HYDROmorphone (DILAUDID) injection, levalbuterol, magnesium sulfate bolus IVPB, metoCLOPramide, metoprolol tartrate, ondansetron (ZOFRAN) IV, oxyCODONE-acetaminophen, phenol, polyethylene glycol  Assessment: POD 3 right iliofemoral endarterectomy and right fifth ray amputation. Mild bleeding from 5th ray amputation site. Clean,dry pressure dressing applied. Platelet count 140k He was noted to have mild expanding right groin hematoma in the PACU and pressure was held. This was monitored and stable.VSS. Afebrile. 12/29 right SFA Eluvia stent placement. RLE well perfused.  ABIs: vessels non-compressible.  On chronic Coumadin therapy with history of CVA.  Heparin infusion held post-op.  End-stage renal disease on chronic intermittent hemodialysis. Nephrology following.  Anemia: multiactorial:acute blood loss anemia, chronic kidney disease.  Receiving ESA got 2u PCs yesterday. Tolerated well.  Hgb up to 9.5 today.   Plan: -Continue aspirin, statin, Plavix. -OK to mobilize. Continue with PT/OT. -? Restart warfarin today -Dispo: HHPT  -DVT prophylaxis:  SCDs   Risa Grill, PA-C [facility] [phone] 08/09/2020  7:08 AM   Resume warfarin today Ambulation d/w pt and wife they think he may be ok at home next 1-2 days  Ruta Hinds, MD [facility] [place] Office: [phone]

## 2020-08-09 NOTE — Progress Notes (Signed)
ANTICOAGULATION CONSULT NOTE  Pharmacy Consult for warfarin Indication: hx LV thrombus / CVA  No Known Allergies  Patient Measurements: Height: 6' (182.9 cm) Weight: 67.2 kg (148 lb 2.4 oz) IBW/kg (Calculated) : 77.6 Heparin Dosing Weight: 63kg  Vital Signs: Temp: 98.6 F (37 C) (01/02 1107) Temp Source: Oral (01/02 1107) BP: 144/64 (01/02 1107) Pulse Rate: 89 (01/02 1107)  Labs: Recent Labs    08/07/20 0307 08/07/20 1453 08/08/20 0704 08/09/20 0225  HGB  --  7.0* 6.9* 9.5*  HCT  --  21.2* 21.1* 27.0*  PLT  --  154 146* 140*  CREATININE 8.38* 4.87*  --  5.38*    Estimated Creatinine Clearance: 12.7 mL/min (A) (by C-G formula based on SCr of 5.38 mg/dL (H)).   Medical History: Past Medical History:  Diagnosis Date  . Diabetes mellitus without complication ([place])   . FUO (fever of unknown origin) 09/17/2019  . Hypertension   . Renal disorder 2015   right kidney transplant  . Stroke [facility])      Assessment: 22 yoM on warfarin PTA for hx LV thrombus and CVA admitted for PVD angiography and stenting toe amputation 12/30. Warfarin was held preop and heparin was started 8h after removal of sheath. Pharmacy has been consulted to resume warfarin.   INR 1.1 on admission. PTA dose 9 mg daily except 6 mg MWF. Hgb improved post-op, PLTs low/stable. LFTs WNL.   Goal of Therapy:  INR goal 2-3 Monitor platelets by anticoagulation protocol: Yes   Plan:  Resume warfarin 9mg  x1 today Consider resuming home dose Daily INR, CBC every 72h  Romilda Garret, PharmD  PGY1 Acute Care Pharmacy Resident Phone: [phone] 08/09/2020 1:17 PM  Please check [email or web address] for unit specific pharmacy phone numbers.

## 2020-08-09 NOTE — Progress Notes (Signed)
[facility] Progress Note   Subjective:  Seen in room, doing well, walked w/ tech today.  Objective Vitals:   08/08/20 2317 08/09/20 0333 08/09/20 0806 08/09/20 1107  BP: (!) 141/72 (!) 152/68 (!) 142/60 (!) 144/64  Pulse: 93 93 89 89  Resp: 18 15 14 20   Temp: 98.7 F (37.1 C) 98.8 F (37.1 C) 98.5 F (36.9 C) 98.6 F (37 C)  TempSrc: Oral Oral Oral Oral  SpO2: 99% 99% 99% 98%  Weight:  67.2 kg    Height:       Physical Exam General: Well appearing man, NAD. Pleasantly demented. Heart: RRR; no murmur Lungs: CTA anteriorly Abdomen: soft, non-tender Extremities: No LE edema, R foot in sock - not examined Dialysis Access: RUE AVF  Dialysis: MWF at [facility] -- last HD 07/28/20 4h 32min   400/800   65.5kg   2K/2Ca   RUE AVF   Heparin 3000  - Venofer 50mg  IV q week - Mircera 22mcg IV q 2 weeks - Calcitriol 1.20mcg PO q HD  Assessment/Plan: 1.  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 Per [facility] 2.  ESRD: Had missed over 1 week HD on admit. Had HD here the last 3 days in a row. Next HD Monday.  3.  Hypertension/volume: at dry wt, BP's normal to high, no vol excess  4.  Anemia: Hgb 9.5 > 7 > 6.9 today. 2u prbc given yest 1/1 in the room. Received sq ESA 100ug on Sat 01/07/69.  5.  Metabolic bone disease: Ca ok/Phos pending, continue Renvela/VDRA. 6.  T2DM 7.  Hx CVA/vascular dementia   Kelly Splinter, MD 08/08/2020, 12:02 PM  Additional Objective Labs: Basic Metabolic Panel: Recent Labs  Lab 08/07/20 0307 08/07/20 1453 08/09/20 0225  NA 139 136 137  K 6.6* 4.2 3.3*  CL 101 97* 99  CO2 24 27 26   GLUCOSE 102* 206* 168*  BUN 56* 26* 26*  CREATININE 8.38* 4.87* 5.38*  CALCIUM 8.4* 8.2* 8.6*  PHOS  --  4.7*  --    CBC: Recent Labs  Lab 08/06/20 0050 08/06/20 1508 08/07/20 0306 08/07/20 1453 08/08/20 0704 08/09/20 0225  WBC 5.4  --  8.2 7.7 9.2 9.8  HGB 9.5*   < > 7.8* 7.0* 6.9* 9.5*  HCT 29.7*   < > 25.2*  21.2* 21.1* 27.0*  MCV 99.0  --  103.3* 100.5* 100.0 93.1  PLT 181  --  163 154 146* 140*   < > = values in this interval not displayed.    Medications: . sodium chloride    . magnesium sulfate bolus IVPB     . sodium chloride   Intravenous Once  . aspirin  81 mg Oral Daily  . atorvastatin  40 mg Oral q1800  . calcitRIOL  1.75 mcg Oral Once per day on Mon Wed Fri  . Chlorhexidine Gluconate Cloth  6 each Topical Q0600  . clopidogrel  75 mg Oral Q breakfast  . darbepoetin (ARANESP) injection - DIALYSIS  100 mcg Intravenous Q Fri-HD  . docusate sodium  100 mg Oral Daily  . insulin aspart  0-9 Units Subcutaneous TID WC  . lidocaine-prilocaine  1 application Topical Once per day on Mon Wed Fri  . mupirocin ointment  1 application Nasal BID  . pantoprazole  40 mg Oral Daily  . predniSONE  5 mg Oral Q breakfast  . senna-docusate  1 tablet Oral Daily  . sertraline  100 mg Oral QHS  . sevelamer carbonate  800 mg Oral TID WC  . sodium chloride flush  3 mL Intravenous Q12H  . warfarin  9 mg Oral ONCE-1600  . Warfarin - Pharmacist Dosing Inpatient   Does not apply A1937

## 2020-08-09 NOTE — Plan of Care (Signed)
Plan of care reviewed. Pt's hemodynamically stable. No acute distress.  Problem: Clinical Measurements: Goal: Will remain free from infection Outcome: Progressing: afebrile, no signs of infection. Dressing on right foot stayed dry and clean.    Problem: Clinical Measurements: Hb 6.9 on 08/08/20. Goal: Diagnostic test results will improve Outcome: Progressing: blood transfusion given 2 units. Will follow CBC at am.    Problem: Clinical Measurements: Goal: Respiratory complications will improve Outcome: Progressing: no respiratory distress noted.   Problem: Clinical Measurements: Pt was hypotensive and unresponsive during HD on 08/07/2020 Goal: Cardiovascular complication will be avoided Outcome: Progressing: BP stable, NSR on monitor, alert and awake.   Problem: Activity: high fall risk. Goal: Risk for activity intolerance will decrease Outcome: Progressing: able to walk with walker and one assist. Tolerated well.   Problem: Pain Managment: s/p right 5th toe amputation. Goal: General experience of comfort will improve Outcome: Progressing: denied pain.  Mylee Falin [id number],FH

## 2020-08-10 ENCOUNTER — Other Ambulatory Visit (HOSPITAL_COMMUNITY): Payer: Self-pay | Admitting: Physician Assistant

## 2020-08-10 LAB — RENAL FUNCTION PANEL
Albumin: 2.6 g/dL — ABNORMAL LOW (ref 3.5–5.0)
Anion gap: 13 (ref 5–15)
BUN: 45 mg/dL — ABNORMAL HIGH (ref 8–23)
CO2: 26 mmol/L (ref 22–32)
Calcium: 8.6 mg/dL — ABNORMAL LOW (ref 8.9–10.3)
Chloride: 98 mmol/L (ref 98–111)
Creatinine, Ser: 7.79 mg/dL — ABNORMAL HIGH (ref 0.61–1.24)
GFR, Estimated: 7 mL/min — ABNORMAL LOW (ref >60)
Glucose, Bld: 109 mg/dL — ABNORMAL HIGH (ref 70–99)
Phosphorus: 4.8 mg/dL — ABNORMAL HIGH (ref 2.5–4.6)
Potassium: 3.4 mmol/L — ABNORMAL LOW (ref 3.5–5.1)
Sodium: 137 mmol/L (ref 135–145)

## 2020-08-10 LAB — CBC
HCT: 25.6 % — ABNORMAL LOW (ref 39.0–52.0)
Hemoglobin: 8.6 g/dL — ABNORMAL LOW (ref 13.0–17.0)
MCH: 32.2 pg (ref 26.0–34.0)
MCHC: 33.6 g/dL (ref 30.0–36.0)
MCV: 95.9 fL (ref 80.0–100.0)
Platelets: 163 10*3/uL (ref 150–400)
RBC: 2.67 MIL/uL — ABNORMAL LOW (ref 4.22–5.81)
RDW: 19 % — ABNORMAL HIGH (ref 11.5–15.5)
WBC: 10.7 10*3/uL — ABNORMAL HIGH (ref 4.0–10.5)
nRBC: 0 % (ref 0.0–0.2)

## 2020-08-10 LAB — GLUCOSE, CAPILLARY
Glucose-Capillary: 132 mg/dL — ABNORMAL HIGH (ref 70–99)
Glucose-Capillary: 97 mg/dL (ref 70–99)

## 2020-08-10 LAB — PROTIME-INR
INR: 1.1 (ref 0.8–1.2)
Prothrombin Time: 14.1 seconds (ref 11.4–15.2)

## 2020-08-10 MED ORDER — CALCITRIOL 0.5 MCG PO CAPS
ORAL_CAPSULE | ORAL | Status: AC
  Filled 2020-08-10: qty 3

## 2020-08-10 MED ORDER — WARFARIN SODIUM 5 MG PO TABS
9.0000 mg | ORAL_TABLET | Freq: Once | ORAL | Status: AC
  Administered 2020-08-10: 9 mg via ORAL
  Filled 2020-08-10: qty 1

## 2020-08-10 MED ORDER — OXYCODONE-ACETAMINOPHEN 5-325 MG PO TABS: 1.0000 | ORAL_TABLET | Freq: Four times a day (QID) | ORAL | 0 refills | Status: DC | PRN

## 2020-08-10 MED ORDER — CLOPIDOGREL BISULFATE 75 MG PO TABS: 75.0000 mg | ORAL_TABLET | Freq: Every day | ORAL | 3 refills | Status: DC

## 2020-08-10 MED ORDER — CALCITRIOL 0.25 MCG PO CAPS
ORAL_CAPSULE | ORAL | Status: AC
  Filled 2020-08-10: qty 1

## 2020-08-10 MED FILL — OXYCODONE-APAP 5-325MG: 5-325 | 5 days supply | Qty: 20 | Fill #0

## 2020-08-10 MED FILL — CLOPIDOGREL 75 MG TABLET: 75 | 90 days supply | Qty: 90 | Fill #0

## 2020-08-10 NOTE — Progress Notes (Signed)
Mobility Specialist: Progress Note   08/10/20 1610  Mobility  Activity Ambulated in hall  Level of Assistance Minimal assist, patient does 75% or more  Assistive Device Front wheel walker  Distance Ambulated (ft) 30 ft  Mobility Response Tolerated fair  Mobility performed by Mobility specialist  $Mobility charge 1 Mobility   Pre-Mobility: 86 HR, 129/59 BP, 97% SpO2 Post-Mobility: 85 HR, 124/63 BP, 100% SpO2  Pt distance limited due to 9/10 pain in R foot, RN present. Pt back to bed per request.   Harrell Gave Smokey Melott Mobility Specialist

## 2020-08-10 NOTE — TOC Transition Note (Signed)
Transition of Care (TOC) - CM/SW Discharge Note Marvetta Gibbons RN, BSN Transitions of Care Unit 4E- RN Case Manager See Treatment Team for direct phone #    Patient Details  Name: Jonathan Kersh Sr. MRN: [id number] Date of Birth: Dec 11, 1950  Transition of Care [facility]) CM/SW Contact:  Jonathan [name], RN Phone Number: 08/10/2020, 3:16 PM   Clinical Narrative:    Pt stable for transition home today, order placed for HHPT, CM spoke with pt at bedside, choice offered for [facility] Per CMS guidelines from medicare.gov website with star ratings (copy placed in shadow chart)- per pt he does not have a preference and defers to this Probation officer to secure an agency. Per pt he has needed DME at home, states he will have transportation home, and no difficulties with affording medications.   Address, PCP and phone # all confirmed in epic.   Call made to [facility] with [facility] for [facility] referral- referral has been accepted.     Final next level of care: [place] Barriers to Discharge: No Barriers Identified   Patient Goals and CMS Choice Patient states their goals for this hospitalization and ongoing recovery are:: return home CMS Medicare.gov Compare Post Acute Care list provided to:: Patient Choice offered to / list presented to : Patient  Discharge Placement               Home with [facility]        Discharge Plan and Services   Discharge Planning Services: CM Consult Post Acute Care Choice: Home Health          DME Arranged: N/A DME Agency: NA       HH Arranged: PT HH Agency: [place] Date [facility] Agency Contacted: 08/10/20 Time [place]: 4098 Representative spoke with at [place]: [place] Determinants of Health ([place]) Interventions     Readmission Risk Interventions Readmission Risk Prevention Plan 08/10/2020  Transportation Screening Complete  Medication Review Press photographer) Complete  PCP or Specialist appointment within 3-5 days of  discharge Complete  HRI or [place] Complete  SW Recovery Care/Counseling Consult Complete  [place] Not Applicable  Some recent data might be hidden

## 2020-08-10 NOTE — Discharge Instructions (Signed)
Call your Primary Care Provider to arrange for coumadin level to be checked.       [facility]  Discharge instructions  Lower Extremity Bypass Surgery  Please refer to the following instruction for your post-procedure care. Your surgeon or physician assistant will discuss any changes with you.  Activity  You are encouraged to walk as much as you can. You can slowly return to normal activities during the month after your surgery. Avoid strenuous activity and heavy lifting until your doctor tells you it's OK. Avoid activities such as vacuuming or swinging a golf club. Do not drive until your doctor give the OK and you are no longer taking prescription pain medications. It is also normal to have difficulty with sleep habits, eating and bowel movement after surgery. These will go away with time.  Bathing/Showering  Shower daily after you go home. Do not soak in a bathtub, hot tub, or swim until the incision heals completely.  Incision Care  Clean your incision with mild soap and water. Shower every day. Pat the area dry with a clean towel. You do not need a bandage unless otherwise instructed. Do not apply any ointments or creams to your incision. If you have open wounds you will be instructed how to care for them or a visiting nurse may be arranged for you. If you have staples or sutures along your incision they will be removed at your post-op appointment. You may have skin glue on your incision. Do not peel it off. It will come off on its own in about one week.  Wash the groin wound with soap and water daily and pat dry. (No tub bath-only shower)  Then put a dry gauze or washcloth in the groin to keep this area dry to help prevent wound infection.  Do this daily and as needed.  Do not use Vaseline or neosporin on your incisions.  Only use soap and water on your incisions and then protect and keep dry.  Diet  Resume your normal diet. There are no special  food restrictions following this procedure. A low fat/ low cholesterol diet is recommended for all patients with vascular disease. In order to heal from your surgery, it is CRITICAL to get adequate nutrition. Your body requires vitamins, minerals, and protein. Vegetables are the best source of vitamins and minerals. Vegetables also provide the perfect balance of protein. Processed food has little nutritional value, so try to avoid this.  Medications  Resume taking all your medications unless your doctor or physician assistant tells you not to. If your incision is causing pain, you may take over-the-counter pain relievers such as acetaminophen (Tylenol). If you were prescribed a stronger pain medication, please aware these medication can cause nausea and constipation. Prevent nausea by taking the medication with a snack or meal. Avoid constipation by drinking plenty of fluids and eating foods with high amount of fiber, such as fruits, vegetables, and grains. Take Colace 100 mg (an over-the-counter stool softener) twice a day as needed for constipation.  Do not take Tylenol if you are taking prescription pain medications.  Follow Up  Our office will schedule a follow up appointment 2-3 weeks following discharge.  Please call us immediately for any of the following conditions  Severe or worsening pain in your legs or feet while at rest or while walking Increase pain, redness, warmth, or drainage (pus) from your incision site(s)  Fever of 101 degree or higher  The swelling in your  leg with the bypass suddenly worsens and becomes more painful than when you were in the hospital  If you have been instructed to feel your graft pulse then you should do so every day. If you can no longer feel this pulse, call the office immediately. Not all patients are given this instruction.   Leg swelling is common after leg bypass surgery.  The swelling should improve over a few months following surgery. To  improve the swelling, you may elevate your legs above the level of your heart while you are sitting or resting. Your surgeon or physician assistant may ask you to apply an ACE wrap or wear compression (TED) stockings to help to reduce swelling.  Reduce your risk of vascular disease  Stop smoking. If you would like help call [facility] at 1-800-QUIT-NOW (930)508-6331) or [place] at [phone].   Manage your cholesterol  Maintain a desired weight  Control your diabetes weight  Control your diabetes  Keep your blood pressure down   If you have any questions, please call the office at [phone]

## 2020-08-10 NOTE — Progress Notes (Addendum)
  Progress Note    08/10/2020 7:51 AM 4 Days Post-Op  Subjective:  Pain R groin and foot incision   Vitals:   08/09/20 2341 08/10/20 0535  BP: 137/61 (!) 156/65  Pulse: 84 84  Resp: 20 18  Temp: 98.6 F (37 C) 98.4 F (36.9 C)  SpO2: 100% 100%   Physical Exam: Lungs:  Non labored Incisions:  R groin incision c/d/i; R foot with viable skin edges Extremities: palpable R ATA Neurologic: A&O  CBC    Component Value Date/Time   WBC 9.8 08/09/2020 0225   RBC 2.90 (L) 08/09/2020 0225   HGB 9.5 (L) 08/09/2020 0225   HCT 27.0 (L) 08/09/2020 0225   PLT 140 (L) 08/09/2020 0225   MCV 93.1 08/09/2020 0225   MCH 32.8 08/09/2020 0225   MCHC 35.2 08/09/2020 0225   RDW 19.9 (H) 08/09/2020 0225   LYMPHSABS 2.0 09/21/2019 0954   MONOABS 0.8 09/21/2019 0954   EOSABS 0.1 09/21/2019 0954   BASOSABS 0.0 09/21/2019 0954    BMET    Component Value Date/Time   NA 137 08/09/2020 0225   K 3.3 (L) 08/09/2020 0225   CL 99 08/09/2020 0225   CO2 26 08/09/2020 0225   GLUCOSE 168 (H) 08/09/2020 0225   BUN 26 (H) 08/09/2020 0225   CREATININE 5.38 (H) 08/09/2020 0225   CALCIUM 8.6 (L) 08/09/2020 0225   GFRNONAA 11 (L) 08/09/2020 0225   GFRAA 13 (L) 09/22/2019 0733    INR    Component Value Date/Time   INR 1.1 08/10/2020 0057     Intake/Output Summary (Last 24 hours) at 08/10/2020 0751 Last data filed at 08/10/2020 0101 Gross per 24 hour  Intake 480 ml  Output --  Net 480 ml     Assessment/Plan:  70 y.o. male is s/p R iliofemoral endarterectomy and 5th toe ray amp 4 Days Post-Op   R foot well perfused; palpable R ATA Coumadin restarted; INR 1.1 today ESRD per Nephrology; HD today Home after HD today if [facility] PT has been arranged   Dagoberto Ligas, PA-C [facility] [phone] 08/10/2020 7:51 AM  VASCULAR STAFF ADDENDUM: I have independently interviewed and examined the patient. I agree with the above.  Looks great s/p hybrid revascularization. R groin  soft. R AT weakly palp R foot healthy incision line No hard indication for therapeutic INR prior to DC. ESRD so therapeutic lovenox bridge not safe. PT / OT / OOB / Ambulate with Darco shoe OK for home when comfortable ambulating - OK to stay another night if patient / wife uncomfortable with storm.  [name], MD [facility] Phone Number: [phone] 08/10/2020 9:54 AM

## 2020-08-10 NOTE — Care Management Important Message (Signed)
Important Message  Patient Details  Name: Jonathan Hofmann Sr. MRN: [id number] Date of Birth: 1950/12/11   Medicare Important Message Given:  Yes     [name], [name] 08/10/2020, 10:02 AM

## 2020-08-10 NOTE — Progress Notes (Signed)
[place] for warfarin Indication: hx LV thrombus / CVA  No Known Allergies  Patient Measurements: Height: 6' (182.9 cm) Weight: 68.1 kg (150 lb 2.1 oz) IBW/kg (Calculated) : 77.6 Heparin Dosing Weight: 63kg  Vital Signs: Temp: 98.4 F (36.9 C) (01/03 0535) Temp Source: Oral (01/03 0535) BP: 156/65 (01/03 0535) Pulse Rate: 84 (01/03 0535)  Labs: Recent Labs    08/07/20 1453 08/08/20 0704 08/09/20 0225 08/10/20 0057  HGB 7.0* 6.9* 9.5*  --   HCT 21.2* 21.1* 27.0*  --   PLT 154 146* 140*  --   LABPROT  --   --   --  14.1  INR  --   --   --  1.1  CREATININE 4.87*  --  5.38*  --     Estimated Creatinine Clearance: 12.8 mL/min (A) (by C-G formula based on SCr of 5.38 mg/dL (H)).   Medical History: Past Medical History:  Diagnosis Date  . Diabetes mellitus without complication ([place])   . FUO (fever of unknown origin) 09/17/2019  . Hypertension   . Renal disorder 2015   right kidney transplant  . Stroke [facility])      Assessment: 25 yoM on warfarin PTA for hx LV thrombus and CVA admitted for PVD angiography and stenting toe amputation 12/30. Pharmacy has been consulted to resume warfarin.  -INR 1.1  PTA dose 9 mg daily except 6 mg MWF. Hgb improved post-op, PLTs low/stable. LFTs WNL.   Goal of Therapy:  INR goal 2-3 Monitor platelets by anticoagulation protocol: Yes   Plan:  Warfarin 9mg  x1 today Would resume home dose upon discharge Daily INR  Hildred Laser, PharmD Clinical Pharmacist **Pharmacist phone directory can now be found on [email or web address] ([email or web address]).  Listed under [place].

## 2020-08-10 NOTE — Progress Notes (Signed)
[facility] Progress Note   Subjective:  No c/o for HD today  Objective Vitals:   08/10/20 0535 08/10/20 0815 08/10/20 0920 08/10/20 0929  BP: (!) 156/65 (!) 152/61  128/60  Pulse: 84 79    Resp: 18 14    Temp: 98.4 F (36.9 C) 98.3 F (36.8 C)  98 F (36.7 C)  TempSrc: Oral Oral  Oral  SpO2: 100% 99%    Weight:   64 kg   Height:       Physical Exam General: Well appearing man, NAD. Pleasantly demented. Heart: RRR; no murmur Lungs: CTA anteriorly Abdomen: soft, non-tender Extremities: No LE edema, R foot in sock - not examined Dialysis Access: RUE AVF  Dialysis: MWF at [facility] -- last HD 07/28/20 4h 46min   400/800   65.5kg   2K/2Ca   RUE AVF   Heparin 3000  - Venofer 50mg  IV q week - Mircera 1mcg IV q 2 weeks - Calcitriol 1.1mcg PO q HD  Assessment/Plan: 1.  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 Per [facility], doing well.   2.  ESRD: Back on schedule today.  OK for outpt HD from here on 3.  Hypertension/volume: at dry wt, BP's normal to high, no vol excess  4.  Anemia: 2u prbc given yest 1/1 in the room. Received sq ESA 100ug on Sat 7/0/17.   5.  Metabolic bone disease: Ca ok/Phos pending, continue Renvela/VDRA. 6.  T2DM 7.  Hx CVA/vascular dementia  Rexene Agent  08/08/2020, 12:02 PM  Additional Objective Labs: Basic Metabolic Panel: Recent Labs  Lab 08/07/20 0307 08/07/20 1453 08/09/20 0225  NA 139 136 137  K 6.6* 4.2 3.3*  CL 101 97* 99  CO2 24 27 26   GLUCOSE 102* 206* 168*  BUN 56* 26* 26*  CREATININE 8.38* 4.87* 5.38*  CALCIUM 8.4* 8.2* 8.6*  PHOS  --  4.7*  --    CBC: Recent Labs  Lab 08/06/20 0050 08/06/20 1508 08/07/20 0306 08/07/20 1453 08/08/20 0704 08/09/20 0225  WBC 5.4  --  8.2 7.7 9.2 9.8  HGB 9.5*   < > 7.8* 7.0* 6.9* 9.5*  HCT 29.7*   < > 25.2* 21.2* 21.1* 27.0*  MCV 99.0  --  103.3* 100.5* 100.0 93.1  PLT 181  --  163 154 146* 140*   < > = values in this interval  not displayed.    Medications: . sodium chloride    . magnesium sulfate bolus IVPB     . sodium chloride   Intravenous Once  . aspirin  81 mg Oral Daily  . atorvastatin  40 mg Oral q1800  . calcitRIOL  1.75 mcg Oral Once per day on Mon Wed Fri  . Chlorhexidine Gluconate Cloth  6 each Topical Q0600  . clopidogrel  75 mg Oral Q breakfast  . darbepoetin (ARANESP) injection - DIALYSIS  100 mcg Intravenous Q Fri-HD  . docusate sodium  100 mg Oral Daily  . insulin aspart  0-9 Units Subcutaneous TID WC  . lidocaine-prilocaine  1 application Topical Once per day on Mon Wed Fri  . mupirocin ointment  1 application Nasal BID  . pantoprazole  40 mg Oral Daily  . predniSONE  5 mg Oral Q breakfast  . senna-docusate  1 tablet Oral Daily  . sertraline  100 mg Oral QHS  . sevelamer carbonate  800 mg Oral TID WC  . sodium chloride flush  3  mL Intravenous Q12H  . warfarin  9 mg Oral ONCE-1600  . Warfarin - Pharmacist Dosing Inpatient   Does not apply M3014

## 2020-08-10 NOTE — Plan of Care (Signed)
  Problem: Health Behavior/Discharge Planning: Goal: Ability to manage health-related needs will improve Outcome: Not Progressing   

## 2020-08-10 NOTE — Progress Notes (Signed)
PT Cancellation Note  Patient Details Name: Jonathan [name] Sr. MRN: [id number] DOB: Nov 06, 1950   Cancelled Treatment:    Reason Eval/Treat Not Completed: Patient at procedure or test/unavailable. Pt currently off the unit for HD. Will check back as schedule allows to continue with PT POC.    Thelma Comp 08/10/2020, 11:51 AM   Rolinda Roan, PT, DPT Acute Rehabilitation Services Pager: [phone] Office: [phone]

## 2020-08-11 ENCOUNTER — Other Ambulatory Visit: Payer: Self-pay

## 2020-08-11 ENCOUNTER — Encounter (HOSPITAL_BASED_OUTPATIENT_CLINIC_OR_DEPARTMENT_OTHER): Payer: Medicare Other | Admitting: Internal Medicine

## 2020-08-11 ENCOUNTER — Telehealth (HOSPITAL_COMMUNITY): Payer: Self-pay | Admitting: Nephrology

## 2020-08-11 DIAGNOSIS — I70235 Atherosclerosis of native arteries of right leg with ulceration of other part of foot: Secondary | ICD-10-CM

## 2020-08-11 NOTE — Telephone Encounter (Signed)
Transition of care contact from inpatient facility  Date of Discharge: 08/10/2020 Date of Contact: 08/11/2020 - attempted Method of contact: Phone  Attempted to contact patient to discuss transition of care from inpatient admission. Patient did not answer the phone. Message was left on the patient's voicemail with call back number [phone].  Veneta Penton, PA-C [employer] Pager [phone]

## 2020-08-11 NOTE — Telephone Encounter (Signed)
Addendum (2:49pm): Pt's wife called back to discuss transition of care. He is doing fine since hospitalization, pain controlled for now. S/p [facility] admit 12/29 - 08/10/2020 with R 5th osteomyelitis s/p ray amputation and LE endarterectomy. She reports that [facility] has been in touch - will be there tomorrow to assess him, she thinks they will provide PT/OT and dressing changes. He is next due for dialysis tomorrow - she will take him, she does not foresee any issues. They have a walker, cane, and he has a walking boot to avoid putting pressure on post-surgical foot. She cannot think of any additional needs at this time.  Veneta Penton, PA-C [employer] Pager [phone]

## 2020-08-11 NOTE — Discharge Summary (Signed)
Discharge Summary  Patient ID: Jonathan Seaman Sr. [id number] 70 y.o. March 13, 1951  Admit date: 08/05/2020  Discharge date and time: 08/10/2020  6:03 PM   Admitting Physician: [name], MD   Discharge Physician: same  Admission Diagnoses: Atherosclerosis of native arteries of the extremities with ulceration ([place]) [I70.25]  Discharge Diagnoses: same  Admission Condition: fair  Discharged Condition: fair  Indication for Admission: Critical limb ischemia with right foot ulceration  Hospital Course: [name] is a 70 year old male who was brought in as an outpatient and underwent aortogram with right SFA stenting due to critical limb ischemia and right foot wound.  This was performed by Dr. Stanford Breed on 08/05/2020.  He was kept inpatient and based on angiography was indicated for right iliofemoral endarterectomy and bovine patch angioplasty as well as right fifth toe ray amputation which was performed by Dr. Stanford Breed on 08/07/2020.  He tolerated the procedure well and was kept inpatient postoperatively.  Should be noted that nephrology was consulted for management of end-stage renal disease on hemodialysis during inpatient stay.  Postoperative course was unremarkable.  At the time of discharge patient had a palpable right ATA pulse.  He was also fitted for a Darco shoe as he is right heel weightbearing only.  He will follow-up with Dr. Stanford Breed in 2 to 3 weeks for possible suture and staple removal.  He was discharged home with Plavix in addition to Coumadin.  He will follow-up with Coumadin clinic for management of INR.  He will also be prescribed 2 to 3 days of narcotic pain medication for continued postoperative pain control.  He was discharged home with home health PT in stable condition.  Consults: nephrology  Treatments: surgery: Aortogram with right SFA stenting by Dr. Stanford Breed on 08/05/2020 Right iliofemoral endarterectomy with bovine patch angioplasty and right fifth toe ray  amputation by Dr. Stanford Breed on 08/07/2020  Discharge Exam: See progress note 08/10/20 Vitals:   08/10/20 1435 08/10/20 1605  BP: (!) 116/58 124/63  Pulse: 84 85  Resp: 16 18  Temp: 98.5 F (36.9 C)   SpO2: 99% 100%     Disposition: Discharge disposition: 01-Home or Self Care       Patient Instructions:  Allergies as of 08/10/2020   No Known Allergies     Medication List    TAKE these medications   allopurinol 100 MG tablet Commonly known as: ZYLOPRIM Take 100 mg by mouth daily.   aspirin 81 MG chewable tablet Chew 81 mg by mouth daily.   atorvastatin 40 MG tablet Commonly known as: LIPITOR Take 40 mg by mouth daily.   b complex-vitamin c-folic acid 0.8 MG Tabs tablet Take 1 tablet by mouth daily.   BD Pen Needle Nano U/F 32G X 4 MM Misc Generic drug: Insulin Pen Needle 1 each by Other route as needed (insulin).   clopidogrel 75 MG tablet Commonly known as: PLAVIX Take 1 tablet (75 mg total) by mouth daily with breakfast.   feeding supplement Liqd Take 1 Container by mouth daily as needed (nuter).   glucagon (human recombinant) 1 MG injection Commonly known as: GLUCAGEN Inject 1 mg into the muscle once as needed for low blood sugar.   levalbuterol 45 MCG/ACT inhaler Commonly known as: XOPENEX HFA Inhale 2 puffs into the lungs every 8 (eight) hours as needed for wheezing.   lidocaine-prilocaine cream Commonly known as: EMLA Apply 1 application topically 3 (three) times a week. 30 minutes prior to Dialysis - MWF   metoCLOPramide 5  MG tablet Commonly known as: REGLAN Take 5 mg by mouth 3 (three) times daily as needed for nausea (/ with meals).   NovoLOG FlexPen 100 UNIT/ML FlexPen Generic drug: insulin aspart Inject 0-10 Units into the skin daily as needed for high blood sugar. Per sliding scale   oxyCODONE-acetaminophen 5-325 MG tablet Commonly known as: PERCOCET/ROXICET Take 1 tablet by mouth every 6 (six) hours as needed for moderate pain. Notes to  patient: Last dose 1/3 @4pm    predniSONE 5 MG tablet Commonly known as: DELTASONE Take 5 mg by mouth daily with breakfast.   senna-docusate 8.6-50 MG tablet Commonly known as: Senokot-S Take 1 tablet by mouth daily.   sertraline 100 MG tablet Commonly known as: ZOLOFT Take 100 mg by mouth at bedtime.   sevelamer carbonate 800 MG tablet Commonly known as: RENVELA Take 800 mg by mouth 3 (three) times daily.   Tyler Aas FlexTouch 200 UNIT/ML FlexTouch Pen Generic drug: insulin degludec Inject into the skin daily as needed (Low blood glucose). Sliding scale   warfarin 4 MG tablet Commonly known as: COUMADIN Take 1 tablet (4 mg total) by mouth daily at 6 PM. What changed:   when to take this  additional instructions   warfarin 6 MG tablet Commonly known as: COUMADIN Take by mouth See admin instructions. Take 9 mg on Tuesday and Thursday All the other days take 6 mg at dinner What changed: Another medication with the same name was changed. Make sure you understand how and when to take each.      Activity: activity as tolerated Diet: renal diet Wound Care: Keep incision clean and dry as directed  Follow-up with Dr. Stanford Breed in 3 weeks.  Signed: Dagoberto Ligas, PA-C 08/11/2020 10:05 AM [facility] Office: [phone]

## 2020-08-13 ENCOUNTER — Telehealth: Payer: Self-pay

## 2020-08-13 NOTE — Telephone Encounter (Signed)
Nurse called to verify patient should be on Plavix and coumadin - per note this is correct. Also, should change dressing daily - and keep clean and dry.

## 2020-08-25 ENCOUNTER — Encounter (HOSPITAL_COMMUNITY): Payer: Medicare Other

## 2020-08-25 ENCOUNTER — Inpatient Hospital Stay (HOSPITAL_COMMUNITY): Admission: RE | Admit: 2020-08-25 | Payer: Medicare Other | Source: Ambulatory Visit

## 2020-08-26 ENCOUNTER — Encounter (HOSPITAL_COMMUNITY): Payer: Medicare Other

## 2020-09-01 ENCOUNTER — Other Ambulatory Visit: Payer: Self-pay

## 2020-09-01 ENCOUNTER — Ambulatory Visit (HOSPITAL_COMMUNITY)
Admission: RE | Admit: 2020-09-01 | Discharge: 2020-09-01 | Disposition: A | Discharge status: Home / Self Care | Payer: Medicare Other | Source: Ambulatory Visit | Attending: Physician Assistant | Admitting: Physician Assistant

## 2020-09-01 ENCOUNTER — Encounter: Payer: Self-pay | Admitting: Physician Assistant

## 2020-09-01 ENCOUNTER — Ambulatory Visit (INDEPENDENT_AMBULATORY_CARE_PROVIDER_SITE_OTHER): Payer: Medicare Other | Admitting: Physician Assistant

## 2020-09-01 ENCOUNTER — Ambulatory Visit (INDEPENDENT_AMBULATORY_CARE_PROVIDER_SITE_OTHER)
Admission: RE | Admit: 2020-09-01 | Discharge: 2020-09-01 | Disposition: A | Discharge status: Home / Self Care | Payer: Medicare Other | Source: Ambulatory Visit | Attending: Physician Assistant | Admitting: Physician Assistant

## 2020-09-01 VITALS — BP 138/79 | HR 90 | Temp 98.2°F | Resp 20 | Ht 72.0 in

## 2020-09-01 DIAGNOSIS — I70235 Atherosclerosis of native arteries of right leg with ulceration of other part of foot: Secondary | ICD-10-CM

## 2020-09-01 MED ORDER — SODIUM CHLORIDE 0.9% FLUSH: 3.0000 mL | INTRAVENOUS | Status: DC | PRN

## 2020-09-01 MED ORDER — SODIUM CHLORIDE 0.9 % IV SOLN: 250.0000 mL | INTRAVENOUS | Status: DC | PRN

## 2020-09-01 MED ORDER — SODIUM CHLORIDE 0.9% FLUSH: 3.0000 mL | Freq: Two times a day (BID) | INTRAVENOUS | Status: DC

## 2020-09-01 NOTE — Progress Notes (Signed)
v

## 2020-09-01 NOTE — Progress Notes (Signed)
    Postoperative Visit   History of Present Illness   Alesandro Petithomme Sr. is a 70 y.o. year old male who presents status post right SFA stenting by Dr. Stanford Breed on 08/05/2020 and the following day underwent right iliofemoral endarterectomy and fifth toe ray amputation.  He presents today for incision check.  He has noticed some drainage from his right foot incision only over the past couple days.  Drainage has been bloody in nature and denies any yellow purulent material.  He states his right groin incision has healed completely.  He denies any rest pain.  He is on aspirin, statin, Plavix daily.   For VQI Use Only   PRE-ADM LIVING: Home  AMB STATUS: Ambulatory with Assistance   Physical Examination   Vitals:   09/01/20 1519  BP: 138/79  Pulse: 90  Resp: 20  Temp: 98.2 F (36.8 C)  TempSrc: Temporal  SpO2: 95%  Height: 6' (1.829 m)    RLE: Right groin incision completely healed; fifth toe ray amputation incision with some dark areas however no purulence; dark skin edges; peroneal, AT, and PT signal by Doppler; palpable and symmetrical femoral pulses   Medical Decision Making   Desi Stacey Sr. is a 70 y.o. year old male who presents 3 weeks s/p R SFA stent and iliofemoral endarterectomy with fifth toe ray amputation.  . Right groin incision completely healed; staples removed from fifth toe amputation site; sutures were left in place . Arterial duplex concerning for distal common femoral artery narrowing; right SFA stent however remains patent with biphasic flow; right foot seems to be well perfused with AT, PT, and peroneal signals by Doppler . Case was discussed with Dr. Carlis Abbott and subsequently with Dr. Stanford Breed.  Plan will be for aortogram with right lower extremity runoff and possible intervention by Dr. Stanford Breed on Wednesday, 09/09/2020 . The procedure as well as the risks were discussed with the patient and his daughter and they agreed to proceed . He will continue wound care with  daily cleansing and dry dressing changes to right foot; we can remove the nylon sutures at the hospital on the day of his angiogram   Dagoberto Ligas PA-C [facility] [place] Office: [facility] MD: Carlis Abbott

## 2020-09-01 NOTE — H&P (View-Only) (Signed)
    Postoperative Visit   History of Present Illness   Jonathan Shapiro Sr. is a 70 y.o. year old male who presents status post right SFA stenting by Dr. Stanford Breed on 08/05/2020 and the following day underwent right iliofemoral endarterectomy and fifth toe ray amputation.  He presents today for incision check.  He has noticed some drainage from his right foot incision only over the past couple days.  Drainage has been bloody in nature and denies any yellow purulent material.  He states his right groin incision has healed completely.  He denies any rest pain.  He is on aspirin, statin, Plavix daily.   For VQI Use Only   PRE-ADM LIVING: Home  AMB STATUS: Ambulatory with Assistance   Physical Examination   Vitals:   09/01/20 1519  BP: 138/79  Pulse: 90  Resp: 20  Temp: 98.2 F (36.8 C)  TempSrc: Temporal  SpO2: 95%  Height: 6' (1.829 m)    RLE: Right groin incision completely healed; fifth toe ray amputation incision with some dark areas however no purulence; dark skin edges; peroneal, AT, and PT signal by Doppler; palpable and symmetrical femoral pulses   Medical Decision Making   Jonathan Kitzmann Sr. is a 70 y.o. year old male who presents 3 weeks s/p R SFA stent and iliofemoral endarterectomy with fifth toe ray amputation.  . Right groin incision completely healed; staples removed from fifth toe amputation site; sutures were left in place . Arterial duplex concerning for distal common femoral artery narrowing; right SFA stent however remains patent with biphasic flow; right foot seems to be well perfused with AT, PT, and peroneal signals by Doppler . Case was discussed with Dr. Carlis Abbott and subsequently with Dr. Stanford Breed.  Plan will be for aortogram with right lower extremity runoff and possible intervention by Dr. Stanford Breed on Wednesday, 09/09/2020 . The procedure as well as the risks were discussed with the patient and his daughter and they agreed to proceed . He will continue wound care with  daily cleansing and dry dressing changes to right foot; we can remove the nylon sutures at the hospital on the day of his angiogram   Dagoberto Ligas PA-C [facility] [place] Office: [facility] MD: Carlis Abbott

## 2020-09-02 ENCOUNTER — Other Ambulatory Visit: Payer: Self-pay

## 2020-09-02 DIAGNOSIS — I70235 Atherosclerosis of native arteries of right leg with ulceration of other part of foot: Secondary | ICD-10-CM

## 2020-09-04 ENCOUNTER — Encounter (HOSPITAL_COMMUNITY): Payer: Medicare Other

## 2020-09-08 ENCOUNTER — Other Ambulatory Visit (HOSPITAL_COMMUNITY)
Admission: RE | Admit: 2020-09-08 | Discharge: 2020-09-08 | Disposition: A | Discharge status: Home / Self Care | Payer: Medicare Other | Source: Ambulatory Visit | Attending: Vascular Surgery | Admitting: Vascular Surgery

## 2020-09-08 DIAGNOSIS — Z01812 Encounter for preprocedural laboratory examination: Secondary | ICD-10-CM | POA: Diagnosis present

## 2020-09-08 DIAGNOSIS — Z20822 Contact with and (suspected) exposure to covid-19: Secondary | ICD-10-CM | POA: Insufficient documentation

## 2020-09-08 LAB — SARS CORONAVIRUS 2 (TAT 6-24 HRS): SARS Coronavirus 2: NEGATIVE

## 2020-09-09 ENCOUNTER — Encounter (HOSPITAL_COMMUNITY)
Admission: RE | Disposition: A | Discharge status: Home / Self Care | Payer: Self-pay | Source: Home / Self Care | Attending: Vascular Surgery

## 2020-09-09 ENCOUNTER — Other Ambulatory Visit: Payer: Self-pay

## 2020-09-09 ENCOUNTER — Ambulatory Visit (HOSPITAL_COMMUNITY)
Admission: RE | Admit: 2020-09-09 | Discharge: 2020-09-09 | Disposition: A | Discharge status: Home / Self Care | Payer: Medicare Other | Attending: Vascular Surgery | Admitting: Vascular Surgery

## 2020-09-09 DIAGNOSIS — Z79899 Other long term (current) drug therapy: Secondary | ICD-10-CM | POA: Diagnosis not present

## 2020-09-09 DIAGNOSIS — I70211 Atherosclerosis of native arteries of extremities with intermittent claudication, right leg: Secondary | ICD-10-CM | POA: Insufficient documentation

## 2020-09-09 DIAGNOSIS — Z7902 Long term (current) use of antithrombotics/antiplatelets: Secondary | ICD-10-CM | POA: Insufficient documentation

## 2020-09-09 DIAGNOSIS — Z7982 Long term (current) use of aspirin: Secondary | ICD-10-CM | POA: Diagnosis not present

## 2020-09-09 DIAGNOSIS — I771 Stricture of artery: Secondary | ICD-10-CM | POA: Diagnosis not present

## 2020-09-09 HISTORY — PX: ABDOMINAL AORTOGRAM W/LOWER EXTREMITY: CATH118223

## 2020-09-09 HISTORY — PX: PERIPHERAL VASCULAR BALLOON ANGIOPLASTY: CATH118281

## 2020-09-09 HISTORY — PX: PERIPHERAL VASCULAR INTERVENTION: CATH118257

## 2020-09-09 LAB — POCT I-STAT, CHEM 8
BUN: 30 mg/dL — ABNORMAL HIGH (ref 8–23)
Calcium, Ion: 1.11 mmol/L — ABNORMAL LOW (ref 1.15–1.40)
Chloride: 100 mmol/L (ref 98–111)
Creatinine, Ser: 6.8 mg/dL — ABNORMAL HIGH (ref 0.61–1.24)
Glucose, Bld: 98 mg/dL (ref 70–99)
HCT: 45 % (ref 39.0–52.0)
Hemoglobin: 15.3 g/dL (ref 13.0–17.0)
Potassium: 4.2 mmol/L (ref 3.5–5.1)
Sodium: 137 mmol/L (ref 135–145)
TCO2: 29 mmol/L (ref 22–32)

## 2020-09-09 LAB — PROTIME-INR
INR: 1.1 (ref 0.8–1.2)
Prothrombin Time: 13.7 seconds (ref 11.4–15.2)

## 2020-09-09 LAB — GLUCOSE, CAPILLARY: Glucose-Capillary: 108 mg/dL — ABNORMAL HIGH (ref 70–99)

## 2020-09-09 SURGERY — ABDOMINAL AORTOGRAM W/LOWER EXTREMITY
Anesthesia: LOCAL | Laterality: Right

## 2020-09-09 MED ORDER — ASPIRIN EC 81 MG PO TBEC: 81.0000 mg | DELAYED_RELEASE_TABLET | Freq: Every day | ORAL | Status: DC

## 2020-09-09 MED ORDER — HYDRALAZINE HCL 20 MG/ML IJ SOLN: 5.0000 mg | INTRAMUSCULAR | Status: DC | PRN

## 2020-09-09 MED ORDER — LIDOCAINE-EPINEPHRINE 1 %-1:100000 IJ SOLN
INTRAMUSCULAR | Status: DC | PRN
  Administered 2020-09-09: 4 mL via INTRADERMAL

## 2020-09-09 MED ORDER — HEPARIN (PORCINE) IN NACL 1000-0.9 UT/500ML-% IV SOLN
INTRAVENOUS | Status: DC | PRN
  Administered 2020-09-09 (×2): 500 mL

## 2020-09-09 MED ORDER — SODIUM CHLORIDE 0.9 % IV SOLN: 250.0000 mL | INTRAVENOUS | Status: DC | PRN

## 2020-09-09 MED ORDER — FENTANYL CITRATE (PF) 100 MCG/2ML IJ SOLN
INTRAMUSCULAR | Status: DC | PRN
  Administered 2020-09-09: 25 ug via INTRAVENOUS

## 2020-09-09 MED ORDER — ONDANSETRON HCL 4 MG/2ML IJ SOLN: 4.0000 mg | Freq: Four times a day (QID) | INTRAMUSCULAR | Status: DC | PRN

## 2020-09-09 MED ORDER — ACETAMINOPHEN 325 MG PO TABS: 650.0000 mg | ORAL_TABLET | ORAL | Status: DC | PRN

## 2020-09-09 MED ORDER — MIDAZOLAM HCL 2 MG/2ML IJ SOLN
INTRAMUSCULAR | Status: AC
  Filled 2020-09-09: qty 2

## 2020-09-09 MED ORDER — HEPARIN SODIUM (PORCINE) 1000 UNIT/ML IJ SOLN
INTRAMUSCULAR | Status: DC | PRN
  Administered 2020-09-09: 8000 [IU] via INTRAVENOUS

## 2020-09-09 MED ORDER — LIDOCAINE HCL (PF) 1 % IJ SOLN
INTRAMUSCULAR | Status: DC | PRN
  Administered 2020-09-09: 18 mL via INTRADERMAL

## 2020-09-09 MED ORDER — SODIUM CHLORIDE 0.9% FLUSH: 3.0000 mL | INTRAVENOUS | Status: DC | PRN

## 2020-09-09 MED ORDER — LIDOCAINE-EPINEPHRINE 1 %-1:100000 IJ SOLN
INTRAMUSCULAR | Status: AC
  Filled 2020-09-09: qty 1

## 2020-09-09 MED ORDER — LIDOCAINE HCL (PF) 1 % IJ SOLN
INTRAMUSCULAR | Status: AC
  Filled 2020-09-09: qty 30

## 2020-09-09 MED ORDER — HEPARIN (PORCINE) IN NACL 1000-0.9 UT/500ML-% IV SOLN
INTRAVENOUS | Status: AC
  Filled 2020-09-09: qty 1000

## 2020-09-09 MED ORDER — LABETALOL HCL 5 MG/ML IV SOLN: 10.0000 mg | INTRAVENOUS | Status: DC | PRN

## 2020-09-09 MED ORDER — MIDAZOLAM HCL 2 MG/2ML IJ SOLN
INTRAMUSCULAR | Status: DC | PRN
  Administered 2020-09-09: 1 mg via INTRAVENOUS

## 2020-09-09 MED ORDER — IODIXANOL 320 MG/ML IV SOLN
INTRAVENOUS | Status: DC | PRN
  Administered 2020-09-09: 85 mL via INTRA_ARTERIAL

## 2020-09-09 MED ORDER — CLOPIDOGREL BISULFATE 75 MG PO TABS: 75.0000 mg | ORAL_TABLET | Freq: Every day | ORAL | Status: DC

## 2020-09-09 MED ORDER — FENTANYL CITRATE (PF) 100 MCG/2ML IJ SOLN
INTRAMUSCULAR | Status: AC
  Filled 2020-09-09: qty 2

## 2020-09-09 MED ORDER — SODIUM CHLORIDE 0.9% FLUSH: 3.0000 mL | Freq: Two times a day (BID) | INTRAVENOUS | Status: DC

## 2020-09-09 MED ORDER — HEPARIN SODIUM (PORCINE) 1000 UNIT/ML IJ SOLN
INTRAMUSCULAR | Status: AC
  Filled 2020-09-09: qty 1

## 2020-09-09 SURGICAL SUPPLY — 23 items
BALLN MUSTANG 4X20X135 (BALLOONS) ×3
BALLN MUSTANG 7.0X40 135 (BALLOONS) ×3
BALLOON MUSTANG 4X20X135 (BALLOONS) ×2 IMPLANT
BALLOON MUSTANG 7.0X40 135 (BALLOONS) ×2 IMPLANT
CATH OMNI FLUSH 5F 65CM (CATHETERS) ×3 IMPLANT
CATH QUICKCROSS SUPP .035X90CM (MICROCATHETER) ×3 IMPLANT
CLOSURE PERCLOSE PROSTYLE (VASCULAR PRODUCTS) ×3 IMPLANT
DCB RANGER 4.0X40 135 (BALLOONS) ×2 IMPLANT
DEVICE TORQUE H2O (MISCELLANEOUS) ×3 IMPLANT
GLIDEWIRE ANGLED SS 035X260CM (WIRE) ×3 IMPLANT
KIT ENCORE 26 ADVANTAGE (KITS) ×3 IMPLANT
KIT MICROPUNCTURE NIT STIFF (SHEATH) ×3 IMPLANT
KIT PV (KITS) ×3 IMPLANT
RANGER DCB 4.0X40 135 (BALLOONS) ×3
SHEATH PINNACLE 5F 10CM (SHEATH) ×3 IMPLANT
SHEATH PINNACLE MP 6F 45CM (SHEATH) ×3 IMPLANT
SHEATH PROBE COVER 6X72 (BAG) ×3 IMPLANT
STENT ELUVIA 7X40X130 (Permanent Stent) ×3 IMPLANT
SYR CONTROL 10ML ANGIOGRAPHIC (SYRINGE) ×3 IMPLANT
SYR MEDRAD MARK V 150ML (SYRINGE) ×3 IMPLANT
TRANSDUCER W/STOPCOCK (MISCELLANEOUS) ×3 IMPLANT
TRAY PV CATH (CUSTOM PROCEDURE TRAY) ×3 IMPLANT
WIRE G V18X300CM (WIRE) ×3 IMPLANT

## 2020-09-09 NOTE — Op Note (Addendum)
DATE OF SERVICE: 09/09/2020  PATIENT:  Jonathan Alexandria Sr.  70 y.o. 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 / common femoral artery after endarterectomy; right popliteal stenosis  PROCEDURE:   1) US guided LCFA access 2) RLE angiogram with third order cannulation (69m contrast) 3) Drug eluting stenting right external iliac into proximal common femoral artery ([id number]) 4) Drug coated angioplasty popliteal stenosis (4x4078manger) 5) Conscious sedation (81 min)  SURGEON:  Surgeon(s) and Role:    * HawCherre RobinsD - Primary  ASSISTANT: none  ANESTHESIA:   local and IV sedation  EBL: min  BLOOD ADMINISTERED:none  DRAINS: none   LOCAL MEDICATIONS USED:  LIDOCAINE   SPECIMEN:  none  COUNTS: confirmed correct.  TOURNIQUET:  None  PATIENT DISPOSITION:  PACU - hemodynamically stable.   Delay start of Pharmacological VTE agent (>24hrs) due to surgical blood loss or risk of bleeding: no  INDICATION FOR PROCEDURE: Jonathan Hadsell. is a 69 9o. 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 He underwent right SFA stenting 08/05/21 followed by RCFA endarterectomy and fifth toe ray amputation 08/07/21. Post operative duplex ultrasound suggested severe DISTAL common femoral artery stenosis. After careful discussion of risks, benefits, and alternatives the patient was offered angiogram with possible intervention. We specifically discussed access complications. The patient understood and wished to proceed.  OPERATIVE FINDINGS:  Severe distal external iliac / PROXIMAL common femoral artery critical stenosis. Likely missed plaque / flap from endarterectomy. Successfully treated with stenting. Stent does extend partially into proximal common femoral artery. Future percutaneous access in R groin should be through distal  common femoral artery near bifurcation. Right popliteal artery stenosis >50%. Successfully treated with drug coated angioplasty.  DESCRIPTION OF PROCEDURE: After identification of the patient in the pre-operative holding area, the patient was transferred to the operating room. The patient was positioned supine on the operating room table. Anesthesia was induced. The groins was prepped and draped in standard fashion. A surgical pause was performed confirming correct patient, procedure, and operative location.  The left groin was anesthetized with subcutaneous injection of 1% lidocaine. Using ultrasound guidance, the left common femoral artery was accessed with micropuncture technique. Fluoroscopy was used to confirm cannulation over the femoral head. Sheathogram was not performed. The 32F sheath was upsized to 82F.   The right common iliac artery was selected with the 035 glidewire advantage. The wire was advanced into the superficial femoral artery. Over the wire the omni flush catheter was advanced into the external iliac artery. Selective angiography was performed - see above for details.   The decision was made to intervene. The patient was heparinized with 8000 units of heparin. The sheath was exchanged for a 70F x 45cm sheath. Selective angiography of the left lower extremity was performed prior to intervention. The lesions were treated with: REIA / RCFA - [id number], post-dilated with 7x40 Mustang R popliteal behind the knee - 4x40mm63mger DCB  Completion angiography revealed:  No residual stenosis   A perclose device was used to close the arteriotomy. Hemostasis was excellent upon completion.  Conscious sedation was administered with the use of IV fentanyl and midazolam under continuous physician and nurse monitoring.  Heart rate, blood pressure, and oxygen saturation were continuously monitored.  Total sedation time was 81 minutes  Upon completion of the case instrument and sharps counts  were confirmed correct. The  patient was transferred to the PACU in good condition. I was present for all portions of the procedure.  Jonathan Aline. Stanford Breed, MD [facility] Phone Number: [phone] 09/09/2020 12:44 PM

## 2020-09-09 NOTE — Discharge Instructions (Signed)
Femoral Site Care  This sheet gives you information about how to care for yourself after your procedure. Your health care provider may also give you more specific instructions. If you have problems or questions, contact your health care provider. What can I expect after the procedure? After the procedure, it is common to have:  Bruising that usually fades within 1-2 weeks.  Tenderness at the site. Follow these instructions at home: Wound care  Follow instructions from your health care provider about how to take care of your insertion site. Make sure you: ? Wash your hands with soap and water before you change your bandage (dressing). If soap and water are not available, use hand sanitizer. ? Change your dressing as told by your health care provider. ? Leave stitches (sutures), skin glue, or adhesive strips in place. These skin closures may need to stay in place for 2 weeks or longer. If adhesive strip edges start to loosen and curl up, you may trim the loose edges. Do not remove adhesive strips completely unless your health care provider tells you to do that.  Do not take baths, swim, or use a hot tub until your health care provider approves.  You may shower 24-48 hours after the procedure or as told by your health care provider. ? Gently wash the site with plain soap and water. ? Pat the area dry with a clean towel. ? Do not rub the site. This may cause bleeding.  Do not apply powder or lotion to the site. Keep the site clean and dry.  Check your femoral site every day for signs of infection. Check for: ? Redness, swelling, or pain. ? Fluid or blood. ? Warmth. ? Pus or a bad smell. Activity  For the first 2-3 days after your procedure, or as long as directed: ? Avoid climbing stairs as much as possible. ? Do not squat.  Do not lift anything that is heavier than 10 lb (4.5 kg), or the limit that you are told, until your health care provider says that it is safe.  Rest as  directed. ? Avoid sitting for a long time without moving. Get up to take short walks every 1-2 hours.  Do not drive for 24 hours if you were given a medicine to help you relax (sedative). General instructions  Take over-the-counter and prescription medicines only as told by your health care provider.  Keep all follow-up visits as told by your health care provider. This is important. Contact a health care provider if you have:  A fever or chills.  You have redness, swelling, or pain around your insertion site. Get help right away if:  The catheter insertion area swells very fast.  You pass out.  You suddenly start to sweat or your skin gets clammy.  The catheter insertion area is bleeding, and the bleeding does not stop when you hold steady pressure on the area.  The area near or just beyond the catheter insertion site becomes pale, cool, tingly, or numb. These symptoms may represent a serious problem that is an emergency. Do not wait to see if the symptoms will go away. Get medical help right away. Call your local emergency services (911 in the [place] Do not drive yourself to the hospital. Summary  After the procedure, it is common to have bruising that usually fades within 1-2 weeks.  Check your femoral site every day for signs of infection.  Do not lift anything that is heavier than 10 lb (4.5 kg), or   the limit that you are told, until your health care provider says that it is safe. This information is not intended to replace advice given to you by your health care provider. Make sure you discuss any questions you have with your health care provider. Document Revised: 03/27/2020 Document Reviewed: 03/27/2020 Elsevier Patient Education  2021 [employer].  

## 2020-09-09 NOTE — Progress Notes (Signed)
Patient and wife was given discharge instructions. Both verbalized understanding. 

## 2020-09-09 NOTE — Interval H&P Note (Signed)
History and Physical Interval Note:  09/09/2020 11:01 AM  Jonathan Alexandria Sr.  has presented today for surgery, with the diagnosis of pad.  The various methods of treatment have been discussed with the patient and family. After consideration of risks, benefits and other options for treatment, the patient has consented to  Procedure(s): ABDOMINAL AORTOGRAM W/LOWER EXTREMITY (N/A) as a surgical intervention.  The patient's history has been reviewed, patient examined, no change in status, stable for surgery.  I have reviewed the patient's chart and labs.  Questions were answered to the patient's satisfaction.     [name]

## 2020-09-10 ENCOUNTER — Encounter (HOSPITAL_COMMUNITY): Payer: Self-pay | Admitting: Vascular Surgery

## 2020-09-10 ENCOUNTER — Ambulatory Visit: Payer: Medicare Other | Admitting: Podiatry

## 2020-09-14 ENCOUNTER — Encounter (HOSPITAL_COMMUNITY): Payer: Self-pay | Admitting: Vascular Surgery

## 2020-09-15 ENCOUNTER — Encounter (HOSPITAL_COMMUNITY): Payer: Medicare Other

## 2020-09-15 ENCOUNTER — Ambulatory Visit: Payer: Medicare Other | Admitting: Vascular Surgery

## 2020-09-15 ENCOUNTER — Other Ambulatory Visit: Payer: Self-pay

## 2020-09-15 DIAGNOSIS — G8190 Hemiplegia, unspecified affecting unspecified side: Secondary | ICD-10-CM | POA: Insufficient documentation

## 2020-09-15 DIAGNOSIS — R109 Unspecified abdominal pain: Secondary | ICD-10-CM | POA: Insufficient documentation

## 2020-09-15 DIAGNOSIS — Z7189 Other specified counseling: Secondary | ICD-10-CM | POA: Insufficient documentation

## 2020-09-15 DIAGNOSIS — I70235 Atherosclerosis of native arteries of right leg with ulceration of other part of foot: Secondary | ICD-10-CM

## 2020-09-15 DIAGNOSIS — Z8673 Personal history of transient ischemic attack (TIA), and cerebral infarction without residual deficits: Secondary | ICD-10-CM | POA: Insufficient documentation

## 2020-09-15 DIAGNOSIS — Z0000 Encounter for general adult medical examination without abnormal findings: Secondary | ICD-10-CM | POA: Insufficient documentation

## 2020-09-15 DIAGNOSIS — R10A1 Flank pain, right side: Secondary | ICD-10-CM | POA: Insufficient documentation

## 2020-09-15 DIAGNOSIS — Z8616 Personal history of COVID-19: Secondary | ICD-10-CM | POA: Insufficient documentation

## 2020-09-15 DIAGNOSIS — D6859 Other primary thrombophilia: Secondary | ICD-10-CM | POA: Insufficient documentation

## 2020-09-15 DIAGNOSIS — M86671 Other chronic osteomyelitis, right ankle and foot: Secondary | ICD-10-CM | POA: Insufficient documentation

## 2020-09-22 ENCOUNTER — Encounter: Payer: Self-pay | Admitting: Vascular Surgery

## 2020-09-22 ENCOUNTER — Other Ambulatory Visit: Payer: Self-pay

## 2020-09-22 ENCOUNTER — Ambulatory Visit (HOSPITAL_COMMUNITY)
Admission: RE | Admit: 2020-09-22 | Discharge: 2020-09-22 | Disposition: A | Discharge status: Home / Self Care | Payer: Medicare Other | Source: Ambulatory Visit | Attending: Vascular Surgery | Admitting: Vascular Surgery

## 2020-09-22 ENCOUNTER — Ambulatory Visit (INDEPENDENT_AMBULATORY_CARE_PROVIDER_SITE_OTHER): Payer: Self-pay | Admitting: Vascular Surgery

## 2020-09-22 ENCOUNTER — Ambulatory Visit (INDEPENDENT_AMBULATORY_CARE_PROVIDER_SITE_OTHER)
Admit: 2020-09-22 | Discharge: 2020-09-22 | Disposition: A | Discharge status: Home / Self Care | Payer: Medicare Other | Attending: Vascular Surgery | Admitting: Vascular Surgery

## 2020-09-22 VITALS — BP 153/86 | HR 87 | Temp 98.1°F | Resp 20 | Ht 72.0 in | Wt 187.0 lb

## 2020-09-22 DIAGNOSIS — I70235 Atherosclerosis of native arteries of right leg with ulceration of other part of foot: Secondary | ICD-10-CM

## 2020-09-22 NOTE — Progress Notes (Addendum)
   ASSESSMENT & PLAN:  Jonathan Ruppe Sr. is a 70 y.o. male with atherosclerosis of native arteries of right lower extremity causing ulceration status post: 08/05/20: R SFA stenting 08/07/20: R iliofemoral endarterectomy and fifth toe ray amputation 09/09/20: R external iliac stenting  His right foot has two areas of ulceration He is maximally revascularized Will need local wound care and observation. Recommend betadine paint and clean dressing daily. Follow up with me in 1 month  SUBJECTIVE:  No complaints. No pain in foot.  OBJECTIVE:  BP (!) 153/86 (BP Location: Left Arm, Patient Position: Sitting, Cuff Size: Large)   Pulse 87   Temp 98.1 F (36.7 C)   Resp 20   Ht 6' (1.829 m)   Wt 187 lb (84.8 kg)   SpO2 100%   BMI 25.36 kg/m    R lateral foot with two areas of ulceration, new since last evaluation No palpable pedal pulses   CBC Latest Ref Rng & Units 09/09/2020 08/10/2020 08/09/2020  WBC 4.0 - 10.5 K/uL - 10.7(H) 9.8  Hemoglobin 13.0 - 17.0 g/dL 15.3 8.6(L) 9.5(L)  Hematocrit 39.0 - 52.0 % 45.0 25.6(L) 27.0(L)  Platelets 150 - 400 K/uL - 163 140(L)     CMP Latest Ref Rng & Units 09/09/2020 08/10/2020 08/09/2020  Glucose 70 - 99 mg/dL 98 109(H) 168(H)  BUN 8 - 23 mg/dL 30(H) 45(H) 26(H)  Creatinine 0.61 - 1.24 mg/dL 6.80(H) 7.79(H) 5.38(H)  Sodium 135 - 145 mmol/L 137 137 137  Potassium 3.5 - 5.1 mmol/L 4.2 3.4(L) 3.3(L)  Chloride 98 - 111 mmol/L 100 98 99  CO2 22 - 32 mmol/L - 26 26  Calcium 8.9 - 10.3 mg/dL - 8.6(L) 8.6(L)  Total Protein 6.5 - 8.1 g/dL - - -  Total Bilirubin 0.3 - 1.2 mg/dL - - -  Alkaline Phos 38 - 126 U/L - - -  AST 15 - 41 U/L - - -  ALT 0 - 44 U/L - - -    Estimated Creatinine Clearance: 11.6 mL/min (A) (by C-G formula based on SCr of 6.8 mg/dL (H)).       [name], MD [facility] Phone Number: [phone] 09/22/2020 4:07 PM

## 2020-10-01 ENCOUNTER — Ambulatory Visit (INDEPENDENT_AMBULATORY_CARE_PROVIDER_SITE_OTHER): Payer: Medicare Other | Admitting: Physician Assistant

## 2020-10-01 ENCOUNTER — Encounter: Payer: Self-pay | Admitting: Physician Assistant

## 2020-10-01 ENCOUNTER — Other Ambulatory Visit: Payer: Self-pay

## 2020-10-01 DIAGNOSIS — E08621 Diabetes mellitus due to underlying condition with foot ulcer: Secondary | ICD-10-CM

## 2020-10-01 DIAGNOSIS — I70235 Atherosclerosis of native arteries of right leg with ulceration of other part of foot: Secondary | ICD-10-CM

## 2020-10-01 DIAGNOSIS — L97402 Non-pressure chronic ulcer of unspecified heel and midfoot with fat layer exposed: Secondary | ICD-10-CM

## 2020-10-01 NOTE — H&P (View-Only) (Signed)
POST OPERATIVE OFFICE NOTE    CC:  F/u for surgery  HPI:  This is a 70 y.o. male who is s/p right SFA stenting by Dr. Stanford Breed on 08/05/2020 and the following day underwent right iliofemoral endarterectomy and fifth toe ray amputation. He was brought in today by his mother secondary to right foot malodor and increased drainage.  Prior to this he had dry gangrene over the lateral foot which was being treated with Betadine paint.    He denise increased pain and the right groin incision has healed well.  He is on Coumadin and Plavix.  HD MWF.      No Known Allergies  Current Outpatient Medications  Medication Sig Dispense Refill  . allopurinol (ZYLOPRIM) 100 MG tablet Take 100 mg by mouth daily.     Marland Kitchen aspirin 81 MG chewable tablet Chew 81 mg by mouth daily.     Marland Kitchen atorvastatin (LIPITOR) 40 MG tablet Take 40 mg by mouth daily.    Marland Kitchen b complex-vitamin c-folic acid (NEPHRO-VITE) 0.8 MG TABS tablet Take 1 tablet by mouth daily.    . clopidogrel (PLAVIX) 75 MG tablet Take 1 tablet (75 mg total) by mouth daily with breakfast. 30 tablet 3  . feeding supplement (BOOST HIGH PROTEIN) LIQD Take 1 Container by mouth daily as needed (nutrition).    . insulin aspart protamine- aspart (NOVOLOG MIX 70/30) (70-30) 100 UNIT/ML injection Inject 0-10 Units into the skin daily as needed (high blood sugar).    . Insulin Pen Needle 32G X 4 MM MISC 1 each by Other route as needed (insulin).     Marland Kitchen levalbuterol (XOPENEX HFA) 45 MCG/ACT inhaler Inhale 2 puffs into the lungs every 8 (eight) hours as needed for wheezing. 1 Inhaler 12  . lidocaine-prilocaine (EMLA) cream Apply 1 application topically 3 (three) times a week. 30 minutes prior to Dialysis - MWF  11  . metoCLOPramide (REGLAN) 5 MG tablet Take 5 mg by mouth 3 (three) times daily as needed (indigestion).    Marland Kitchen oxyCODONE-acetaminophen (PERCOCET/ROXICET) 5-325 MG tablet Take 1 tablet by mouth every 6 (six) hours as needed for moderate pain. 20 tablet 0  .  predniSONE (DELTASONE) 5 MG tablet Take 5 mg by mouth daily with breakfast.    . senna-docusate (SENOKOT-S) 8.6-50 MG tablet Take 1 tablet by mouth daily as needed for mild constipation.    . sertraline (ZOLOFT) 100 MG tablet Take 100 mg by mouth daily as needed (anxiety).    . sevelamer carbonate (RENVELA) 800 MG tablet Take 800 mg by mouth 3 (three) times daily with meals.    Tyler Aas FLEXTOUCH 200 UNIT/ML FlexTouch Pen Inject 2 Units into the skin daily as needed (Low blood glucose).    Marland Kitchen warfarin (COUMADIN) 6 MG tablet Take 6-9 mg by mouth See admin instructions. Take 9 mg on Tue and Thurs. Take 6 mg on Sun, Mon, Wed, Fri, and Sat     No current facility-administered medications for this visit.     ROS:  See HPI  Physical Exam:  RLE: Right groin incision completely healed; fifth toe ray amputation incision with some dark areas however no purulence, clear drainage with malodor; dark skin edges mixed with yellow unhealthy tissue; peroneal, AT, and PT signal by Doppler; palpable and symmetrical femoral pulses Lungs: non labored breathing.    Assessment/Plan:  This is a 70 y.o. male who is s/p: right SFA stenting by Dr. Stanford Breed on 08/05/2020 and the following day underwent right iliofemoral endarterectomy and  fifth toe ray amputation.  He has DM, ESRD, HTN and PAD.  We will schedule him for right foot debridement with Dr. Stanford Breed.  I have ask him to stop his Coumadin today 10/01/20.  Our schedulers will call him tomorrow for surgical planning.    Roxy Horseman PA-C [facility] [phone]  Clinic MD:  Oneida Alar

## 2020-10-01 NOTE — Progress Notes (Signed)
POST OPERATIVE OFFICE NOTE    CC:  F/u for surgery  HPI:  This is a 70 y.o. male who is s/p right SFA stenting by Dr. Stanford Breed on 08/05/2020 and the following day underwent right iliofemoral endarterectomy and fifth toe ray amputation. He was brought in today by his mother secondary to right foot malodor and increased drainage.  Prior to this he had dry gangrene over the lateral foot which was being treated with Betadine paint.    He denise increased pain and the right groin incision has healed well.  He is on Coumadin and Plavix.  HD MWF.      No Known Allergies  Current Outpatient Medications  Medication Sig Dispense Refill  . allopurinol (ZYLOPRIM) 100 MG tablet Take 100 mg by mouth daily.     Marland Kitchen aspirin 81 MG chewable tablet Chew 81 mg by mouth daily.     Marland Kitchen atorvastatin (LIPITOR) 40 MG tablet Take 40 mg by mouth daily.    Marland Kitchen b complex-vitamin c-folic acid (NEPHRO-VITE) 0.8 MG TABS tablet Take 1 tablet by mouth daily.    . clopidogrel (PLAVIX) 75 MG tablet Take 1 tablet (75 mg total) by mouth daily with breakfast. 30 tablet 3  . feeding supplement (BOOST HIGH PROTEIN) LIQD Take 1 Container by mouth daily as needed (nutrition).    . insulin aspart protamine- aspart (NOVOLOG MIX 70/30) (70-30) 100 UNIT/ML injection Inject 0-10 Units into the skin daily as needed (high blood sugar).    . Insulin Pen Needle 32G X 4 MM MISC 1 each by Other route as needed (insulin).     Marland Kitchen levalbuterol (XOPENEX HFA) 45 MCG/ACT inhaler Inhale 2 puffs into the lungs every 8 (eight) hours as needed for wheezing. 1 Inhaler 12  . lidocaine-prilocaine (EMLA) cream Apply 1 application topically 3 (three) times a week. 30 minutes prior to Dialysis - MWF  11  . metoCLOPramide (REGLAN) 5 MG tablet Take 5 mg by mouth 3 (three) times daily as needed (indigestion).    Marland Kitchen oxyCODONE-acetaminophen (PERCOCET/ROXICET) 5-325 MG tablet Take 1 tablet by mouth every 6 (six) hours as needed for moderate pain. 20 tablet 0  .  predniSONE (DELTASONE) 5 MG tablet Take 5 mg by mouth daily with breakfast.    . senna-docusate (SENOKOT-S) 8.6-50 MG tablet Take 1 tablet by mouth daily as needed for mild constipation.    . sertraline (ZOLOFT) 100 MG tablet Take 100 mg by mouth daily as needed (anxiety).    . sevelamer carbonate (RENVELA) 800 MG tablet Take 800 mg by mouth 3 (three) times daily with meals.    Jonathan [name] FLEXTOUCH 200 UNIT/ML FlexTouch Pen Inject 2 Units into the skin daily as needed (Low blood glucose).    Marland Kitchen warfarin (COUMADIN) 6 MG tablet Take 6-9 mg by mouth See admin instructions. Take 9 mg on Tue and Thurs. Take 6 mg on Sun, Mon, Wed, Fri, and Sat     No current facility-administered medications for this visit.     ROS:  See HPI  Physical Exam:  RLE: Right groin incision completely healed; fifth toe ray amputation incision with some dark areas however no purulence, clear drainage with malodor; dark skin edges mixed with yellow unhealthy tissue; peroneal, AT, and PT signal by Doppler; palpable and symmetrical femoral pulses Lungs: non labored breathing.    Assessment/Plan:  This is a 70 y.o. male who is s/p: right SFA stenting by Dr. Stanford Breed on 08/05/2020 and the following day underwent right iliofemoral endarterectomy and  fifth toe ray amputation.  He has DM, ESRD, HTN and PAD.  We will schedule him for right foot debridement with Dr. Stanford Breed.  I have ask him to stop his Coumadin today 10/01/20.  Our schedulers will call him tomorrow for surgical planning.    Roxy Horseman PA-C [facility] [phone]  Clinic MD:  Oneida Alar

## 2020-10-02 ENCOUNTER — Encounter: Payer: Self-pay | Admitting: *Deleted

## 2020-10-02 ENCOUNTER — Other Ambulatory Visit: Payer: Self-pay | Admitting: *Deleted

## 2020-10-07 ENCOUNTER — Other Ambulatory Visit (HOSPITAL_COMMUNITY)
Admission: RE | Admit: 2020-10-07 | Discharge: 2020-10-07 | Disposition: A | Discharge status: Home / Self Care | Payer: Medicare Other | Source: Ambulatory Visit | Attending: Vascular Surgery | Admitting: Vascular Surgery

## 2020-10-07 ENCOUNTER — Encounter (HOSPITAL_COMMUNITY): Payer: Self-pay | Admitting: Vascular Surgery

## 2020-10-07 DIAGNOSIS — Z01812 Encounter for preprocedural laboratory examination: Secondary | ICD-10-CM | POA: Insufficient documentation

## 2020-10-07 DIAGNOSIS — Z20822 Contact with and (suspected) exposure to covid-19: Secondary | ICD-10-CM | POA: Insufficient documentation

## 2020-10-07 LAB — SARS CORONAVIRUS 2 (TAT 6-24 HRS): SARS Coronavirus 2: NEGATIVE

## 2020-10-07 NOTE — Progress Notes (Signed)
Patient's Wife Jonathan [name] states patient does not have shortness of breath, fever, cough or chest pain.  PCP - Dr Brigitte Pulse Cardiologist - n/a per Wife  Chest x-ray - 09/15/19 (1V) EKG - 08/05/20 Stress Test - 08/04/16 CE ECHO  TEE - 09/18/19 Cardiac Cath - n/a  Fasting Blood Sugar - 90s Checks Blood Sugar 1 times a day  . THE NIGHT BEFORE SURGERY, take 1/2 of your Novolog 70/30 insulin dose if needed.     . THE MORNING OF SURGERY, take 1 unit tresiba insulin.  . If your CBG is greater than 220 mg/dL, you may take  of your sliding scale (correction) dose of insulin.  . If your blood sugar is less than 70 mg/dL, you will need to treat for low blood sugar: o Treat a low blood sugar (less than 70 mg/dL) with  cup of clear juice (cranberry or apple), 4 glucose tablets, OR glucose gel. o Recheck blood sugar in 15 minutes after treatment (to make sure it is greater than 70 mg/dL). If your blood sugar is not greater than 70 mg/dL on recheck, call [phone] for further instructions.  Blood Thinner Instructions:  Last dose of plavix and coumadin was on 09/30/20 per Wife Jonathan [name].  Aspirin Instructions: Last dose of ASA was on 10/07/20 per Wife Jonathan [name]. . Anesthesia review: Yes  STOP now taking any Aspirin (unless otherwise instructed by your surgeon), Aleve, Naproxen, Ibuprofen, Motrin, Advil, Goody's, BC's, all herbal medications, fish oil, and all vitamins.   Coronavirus Screening Covid test on 10/07/20 is pending Do you have any of the following symptoms:  Cough yes/no: No Fever (>100.74F)  yes/no: No Runny nose yes/no: No Sore throat yes/no: No Difficulty breathing/shortness of breath  yes/no: No  Have you traveled in the last 14 days and where? yes/no: No  Wife Jonathan [name] verbalized understanding of instructions that were given via phone.

## 2020-10-07 NOTE — Anesthesia Preprocedure Evaluation (Addendum)
Anesthesia Evaluation  Patient identified by MRN, date of birth, ID band Patient awake    Reviewed: Allergy & Precautions, NPO status , Patient's Chart, lab work & pertinent test results  Airway Mallampati: II  TM Distance: >3 FB     Dental   Pulmonary shortness of breath, former smoker,    breath sounds clear to auscultation       Cardiovascular hypertension, + CAD, + Past MI and + Peripheral Vascular Disease   Rhythm:Regular Rate:Normal     Neuro/Psych  Headaches, PSYCHIATRIC DISORDERS Dementia CVA    GI/Hepatic negative GI ROS, (+) Hepatitis -  Endo/Other  diabetes  Renal/GU Renal disease     Musculoskeletal   Abdominal   Peds  Hematology   Anesthesia Other Findings   Reproductive/Obstetrics                           Anesthesia Physical Anesthesia Plan  ASA: III  Anesthesia Plan: General   Post-op Pain Management:    Induction: Intravenous  PONV Risk Score and Plan: 2 and Ondansetron, Dexamethasone and Midazolam  Airway Management Planned: LMA  Additional Equipment:   Intra-op Plan:   Post-operative Plan: Extubation in OR  Informed Consent:   Patient has DNR.  Continue DNR.   Dental advisory given  Plan Discussed with: Anesthesiologist and CRNA  Anesthesia Plan Comments: (PAT note by Karoline Caldwell, PA-C: Follows with vascular surgery for hx of PVD.  S/p recent right SFA stenting by Dr. Stanford Breed on 08/05/2020 and the following day underwent right iliofemoral endarterectomy and fifth toe ray amputation. His is on coumadin and Plavix. Per note by Laurence Slate, PA-C 10/01/20, he was instructed to stop his coumadin on 2/24.   History of chronic systolic CHF (depressed EF since at least 2005 per review of records in Care everywhere, echo 03/2004 sowed EF 45%). History of remote MI at age 77. Most recent stress test 08/04/16 showed severe fixed defects consistent with prior  infarction, negative for inducible ischemia. Last seen by cardiology 09/18/19 during admission for CVA. TEE 09/18/19 showed EF 30-35%, no significant valvular abnormalities.   IDDM2, last A1c 5.1 on 08/05/20.  ESRD on HD MWF (Hx failed kidney transplant 2015 - 2019).   Will need DOS labs and evaluation.   TEE 09/18/19: 1. Left ventricular ejection fraction, by estimation, is 30 to 35%. The  left ventricle has severely decreased function. The left ventrical  demonstrates regional wall motion abnormalities (see scoring  diagram/findings for description). There is  moderately increased left ventricular hypertrophy. Left ventricular  diastolic function could not be evaluated. There is severe akinesis of the  left ventricular, entire apical segment.  2. Right ventricular systolic function is normal. The right ventricular  size is normal.  3. No left atrial/left atrial appendage thrombus was detected.  4. The mitral valve is normal in structure and function. Mild mitral  valve regurgitation. No evidence of mitral stenosis.  5. The aortic valve is tricuspid. Aortic valve regurgitation is trivial .  No aortic stenosis is present.  6. There is mild (Grade II) plaque involving the descending aorta.  7. Agitated saline contrast bubble study was negative, with no evidence  of any interatrial shunt.   Conclusion(s)/Recomendation(s): Normal biventricular function without  evidence of hemodynamically significant valvular heart disease. No  evidence of vegetation/infective endocarditis on this transesophageal  echocardiogram. No LA/LAA thrombus identified. Negative bubble study for  interatrial shunt. No intracardiac source of embolism detected on  this on  this transesophageal echocardiogram.   TTE 08/01/19: 1. Left ventricular ejection fraction, by visual estimation, is 30 to  35%. The left ventricle has moderate to severely decreased function. There  is no left ventricular hypertrophy.   2. Severe dyskinesis of the left ventricular, entire apical segment.  3. Apical left ventricular aneurysm.  4. Definity contrast agent was given IV to delineate the left ventricular  endocardial borders.  5. No intracardiac thrombi or masses were visualized.  6. Left ventricular diastolic parameters are consistent with Grade I  diastolic dysfunction (impaired relaxation).  7. Global right ventricle has normal systolic function.The right  ventricular size is normal. No increase in right ventricular wall  thickness.  8. Left atrial size was normal.  9. Right atrial size was normal.  10. The mitral valve is normal in structure. Mild mitral valve  regurgitation. No evidence of mitral stenosis.  11. The tricuspid valve is normal in structure.  12. The aortic valve is normal in structure. Aortic valve regurgitation is  trivial. No evidence of aortic valve sclerosis or stenosis.  13. The pulmonic valve was normal in structure. Pulmonic valve  regurgitation is not visualized.  14. The inferior vena cava is normal in size with greater than 50%  respiratory variability, suggesting right atrial pressure of 3 mmHg.   TTE 11/30/17 (care everywhere): MODERATE LV DYSFUNCTION (See above) WITH MILD LVH  NORMAL LA PRESSURES WITH NORMAL DIASTOLIC FUNCTION  NORMAL RIGHT VENTRICULAR SYSTOLIC FUNCTION  VALVULAR REGURGITATION: TRIVIAL AR, TRIVIAL PR  NO VALVULAR STENOSIS  PERSISTENT LV THROMBUS REDUCED IN SIZE (1.6 cm X 1.0 cm)  NEGATIVE SALINE CONTRAST STUDY   Compared with prior Echo study on 08/04/2016: PERSISTENT LV APICAL  THROMBUS. NO SIGNIFICANT CHANGE IN LV FUNCTION.   Nuclear stress 08/04/16 (care everywhere): FINAL COMMENTS   - No evidence of regadenoson-inducible ischemia.  - Severe, fixed apical/ periapical/ anteroseptal/ inferolateralperfusion defects, in keeping with infarction.  - LVEF= 24%. Global hypokinesis with apical akinesis & probable apical  aneurysm. Dilated LV.  )    Anesthesia Quick Evaluation

## 2020-10-07 NOTE — Progress Notes (Signed)
Anesthesia Chart Review: Same day workup  Follows with vascular surgery for hx of PVD.  S/p recent right SFA stenting by Dr. Stanford Breed on 08/05/2020 and the following day underwent right iliofemoral endarterectomy and fifth toe ray amputation. His is on coumadin and Plavix. Per note by Laurence Slate, PA-C 10/01/20, he was instructed to stop his coumadin on 2/24.   History of chronic systolic CHF (depressed EF since at least 2005 per review of records in Care everywhere, echo 03/2004 sowed EF 45%). History of remote MI at age 16. Most recent stress test 08/04/16 showed severe fixed defects consistent with prior infarction, negative for inducible ischemia. Last seen by cardiology 09/18/19 during admission for CVA. TEE 09/18/19 showed EF 30-35%, no significant valvular abnormalities.   IDDM2, last A1c 5.1 on 08/05/20.  ESRD on HD MWF (Hx failed kidney transplant 2015 - 2019).   Will need DOS labs and evaluation.   TEE 09/18/19: 1. Left ventricular ejection fraction, by estimation, is 30 to 35%. The  left ventricle has severely decreased function. The left ventrical  demonstrates regional wall motion abnormalities (see scoring  diagram/findings for description). There is  moderately increased left ventricular hypertrophy. Left ventricular  diastolic function could not be evaluated. There is severe akinesis of the  left ventricular, entire apical segment.  2. Right ventricular systolic function is normal. The right ventricular  size is normal.  3. No left atrial/left atrial appendage thrombus was detected.  4. The mitral valve is normal in structure and function. Mild mitral  valve regurgitation. No evidence of mitral stenosis.  5. The aortic valve is tricuspid. Aortic valve regurgitation is trivial .  No aortic stenosis is present.  6. There is mild (Grade II) plaque involving the descending aorta.  7. Agitated saline contrast bubble study was negative, with no evidence  of any interatrial  shunt.   Conclusion(s)/Recomendation(s): Normal biventricular function without  evidence of hemodynamically significant valvular heart disease. No  evidence of vegetation/infective endocarditis on this transesophageal  echocardiogram. No LA/LAA thrombus identified. Negative bubble study for  interatrial shunt. No intracardiac source of embolism detected on this on  this transesophageal echocardiogram.   TTE 08/01/19: 1. Left ventricular ejection fraction, by visual estimation, is 30 to  35%. The left ventricle has moderate to severely decreased function. There  is no left ventricular hypertrophy.  2. Severe dyskinesis of the left ventricular, entire apical segment.  3. Apical left ventricular aneurysm.  4. Definity contrast agent was given IV to delineate the left ventricular  endocardial borders.  5. No intracardiac thrombi or masses were visualized.  6. Left ventricular diastolic parameters are consistent with Grade I  diastolic dysfunction (impaired relaxation).  7. Global right ventricle has normal systolic function.The right  ventricular size is normal. No increase in right ventricular wall  thickness.  8. Left atrial size was normal.  9. Right atrial size was normal.  10. The mitral valve is normal in structure. Mild mitral valve  regurgitation. No evidence of mitral stenosis.  11. The tricuspid valve is normal in structure.  12. The aortic valve is normal in structure. Aortic valve regurgitation is  trivial. No evidence of aortic valve sclerosis or stenosis.  13. The pulmonic valve was normal in structure. Pulmonic valve  regurgitation is not visualized.  14. The inferior vena cava is normal in size with greater than 50%  respiratory variability, suggesting right atrial pressure of 3 mmHg.   TTE 11/30/17 (care everywhere): MODERATE LV DYSFUNCTION (See above) WITH MILD  LVH  NORMAL LA PRESSURES WITH NORMAL DIASTOLIC FUNCTION  NORMAL RIGHT VENTRICULAR SYSTOLIC  FUNCTION  VALVULAR REGURGITATION: TRIVIAL AR, TRIVIAL PR  NO VALVULAR STENOSIS  PERSISTENT LV THROMBUS REDUCED IN SIZE (1.6 cm X 1.0 cm)  NEGATIVE SALINE CONTRAST STUDY   Compared with prior Echo study on 08/04/2016: PERSISTENT LV APICAL  THROMBUS. NO SIGNIFICANT CHANGE IN LV FUNCTION.   Nuclear stress 08/04/16 (care everywhere): FINAL COMMENTS   - No evidence of regadenoson-inducible ischemia.  - Severe, fixed apical/ periapical/ anteroseptal/ inferolateralperfusion defects, in keeping with infarction.  - LVEF= 24%. 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 Dilated LV.    [name] [facility] Short Stay Center/Anesthesiology Phone [phone] 10/07/2020 10:59 AM

## 2020-10-08 ENCOUNTER — Other Ambulatory Visit: Payer: Self-pay

## 2020-10-08 ENCOUNTER — Ambulatory Visit (HOSPITAL_COMMUNITY)
Admission: RE | Admit: 2020-10-08 | Discharge: 2020-10-08 | Disposition: A | Discharge status: Home / Self Care | Payer: Medicare Other | Attending: Vascular Surgery | Admitting: Vascular Surgery

## 2020-10-08 ENCOUNTER — Ambulatory Visit (HOSPITAL_COMMUNITY): Payer: Medicare Other | Admitting: Physician Assistant

## 2020-10-08 ENCOUNTER — Encounter (HOSPITAL_COMMUNITY)
Admission: RE | Disposition: A | Discharge status: Home / Self Care | Payer: Self-pay | Source: Home / Self Care | Attending: Vascular Surgery

## 2020-10-08 ENCOUNTER — Encounter (HOSPITAL_COMMUNITY): Payer: Self-pay | Admitting: Vascular Surgery

## 2020-10-08 DIAGNOSIS — Z992 Dependence on renal dialysis: Secondary | ICD-10-CM | POA: Insufficient documentation

## 2020-10-08 DIAGNOSIS — X58XXXA Exposure to other specified factors, initial encounter: Secondary | ICD-10-CM | POA: Insufficient documentation

## 2020-10-08 DIAGNOSIS — Z79899 Other long term (current) drug therapy: Secondary | ICD-10-CM | POA: Insufficient documentation

## 2020-10-08 DIAGNOSIS — Z7901 Long term (current) use of anticoagulants: Secondary | ICD-10-CM | POA: Diagnosis not present

## 2020-10-08 DIAGNOSIS — E1122 Type 2 diabetes mellitus with diabetic chronic kidney disease: Secondary | ICD-10-CM | POA: Diagnosis not present

## 2020-10-08 DIAGNOSIS — Z794 Long term (current) use of insulin: Secondary | ICD-10-CM | POA: Diagnosis not present

## 2020-10-08 DIAGNOSIS — T8753 Necrosis of amputation stump, right lower extremity: Secondary | ICD-10-CM | POA: Diagnosis present

## 2020-10-08 DIAGNOSIS — Z7982 Long term (current) use of aspirin: Secondary | ICD-10-CM | POA: Insufficient documentation

## 2020-10-08 DIAGNOSIS — Z7902 Long term (current) use of antithrombotics/antiplatelets: Secondary | ICD-10-CM | POA: Diagnosis not present

## 2020-10-08 DIAGNOSIS — I96 Gangrene, not elsewhere classified: Secondary | ICD-10-CM | POA: Diagnosis not present

## 2020-10-08 DIAGNOSIS — E1151 Type 2 diabetes mellitus with diabetic peripheral angiopathy without gangrene: Secondary | ICD-10-CM | POA: Insufficient documentation

## 2020-10-08 DIAGNOSIS — N186 End stage renal disease: Secondary | ICD-10-CM | POA: Insufficient documentation

## 2020-10-08 DIAGNOSIS — I120 Hypertensive chronic kidney disease with stage 5 chronic kidney disease or end stage renal disease: Secondary | ICD-10-CM | POA: Insufficient documentation

## 2020-10-08 DIAGNOSIS — Z7952 Long term (current) use of systemic steroids: Secondary | ICD-10-CM | POA: Insufficient documentation

## 2020-10-08 HISTORY — PX: WOUND DEBRIDEMENT: SHX247

## 2020-10-08 LAB — GLUCOSE, CAPILLARY
Glucose-Capillary: 84 mg/dL (ref 70–99)
Glucose-Capillary: 85 mg/dL (ref 70–99)

## 2020-10-08 LAB — POCT I-STAT, CHEM 8
BUN: 37 mg/dL — ABNORMAL HIGH (ref 8–23)
Calcium, Ion: 1 mmol/L — ABNORMAL LOW (ref 1.15–1.40)
Chloride: 95 mmol/L — ABNORMAL LOW (ref 98–111)
Creatinine, Ser: 5.2 mg/dL — ABNORMAL HIGH (ref 0.61–1.24)
Glucose, Bld: 87 mg/dL (ref 70–99)
HCT: 44 % (ref 39.0–52.0)
Hemoglobin: 15 g/dL (ref 13.0–17.0)
Potassium: 4 mmol/L (ref 3.5–5.1)
Sodium: 133 mmol/L — ABNORMAL LOW (ref 135–145)
TCO2: 31 mmol/L (ref 22–32)

## 2020-10-08 LAB — PROTIME-INR
INR: 1 (ref 0.8–1.2)
Prothrombin Time: 12.9 seconds (ref 11.4–15.2)

## 2020-10-08 SURGERY — DEBRIDEMENT, WOUND
Anesthesia: General | Laterality: Right

## 2020-10-08 MED ORDER — LIDOCAINE 2% (20 MG/ML) 5 ML SYRINGE
INTRAMUSCULAR | Status: AC
  Filled 2020-10-08: qty 5

## 2020-10-08 MED ORDER — PHENYLEPHRINE 40 MCG/ML (10ML) SYRINGE FOR IV PUSH (FOR BLOOD PRESSURE SUPPORT)
PREFILLED_SYRINGE | INTRAVENOUS | Status: AC
  Filled 2020-10-08: qty 10

## 2020-10-08 MED ORDER — ONDANSETRON HCL 4 MG/2ML IJ SOLN
INTRAMUSCULAR | Status: AC
  Filled 2020-10-08: qty 2

## 2020-10-08 MED ORDER — CHLORHEXIDINE GLUCONATE 4 % EX LIQD: 60.0000 mL | Freq: Once | CUTANEOUS | Status: DC

## 2020-10-08 MED ORDER — CHLORHEXIDINE GLUCONATE 0.12 % MT SOLN: 15.0000 mL | Freq: Once | OROMUCOSAL | Status: AC

## 2020-10-08 MED ORDER — PROPOFOL 10 MG/ML IV BOLUS
INTRAVENOUS | Status: DC | PRN
  Administered 2020-10-08: 150 mg via INTRAVENOUS

## 2020-10-08 MED ORDER — SODIUM CHLORIDE 0.9 % IV SOLN: INTRAVENOUS | Status: DC

## 2020-10-08 MED ORDER — FENTANYL CITRATE (PF) 250 MCG/5ML IJ SOLN
INTRAMUSCULAR | Status: AC
  Filled 2020-10-08: qty 5

## 2020-10-08 MED ORDER — CEFAZOLIN SODIUM-DEXTROSE 2-4 GM/100ML-% IV SOLN
INTRAVENOUS | Status: AC
  Filled 2020-10-08: qty 100

## 2020-10-08 MED ORDER — ORAL CARE MOUTH RINSE: 15.0000 mL | Freq: Once | OROMUCOSAL | Status: AC

## 2020-10-08 MED ORDER — CEFAZOLIN SODIUM-DEXTROSE 2-4 GM/100ML-% IV SOLN
2.0000 g | INTRAVENOUS | Status: AC
  Administered 2020-10-08: 2 g via INTRAVENOUS

## 2020-10-08 MED ORDER — PHENYLEPHRINE 40 MCG/ML (10ML) SYRINGE FOR IV PUSH (FOR BLOOD PRESSURE SUPPORT)
PREFILLED_SYRINGE | INTRAVENOUS | Status: DC | PRN
  Administered 2020-10-08: 80 ug via INTRAVENOUS
  Administered 2020-10-08: 120 ug via INTRAVENOUS
  Administered 2020-10-08: 80 ug via INTRAVENOUS
  Administered 2020-10-08: 120 ug via INTRAVENOUS
  Administered 2020-10-08: 80 ug via INTRAVENOUS
  Administered 2020-10-08: 120 ug via INTRAVENOUS

## 2020-10-08 MED ORDER — FENTANYL CITRATE (PF) 100 MCG/2ML IJ SOLN: 25.0000 ug | INTRAMUSCULAR | Status: DC | PRN

## 2020-10-08 MED ORDER — CHLORHEXIDINE GLUCONATE 0.12 % MT SOLN
OROMUCOSAL | Status: AC
  Administered 2020-10-08: 15 mL via OROMUCOSAL
  Filled 2020-10-08: qty 15

## 2020-10-08 MED ORDER — OXYCODONE-ACETAMINOPHEN 5-325 MG PO TABS: 1.0000 | ORAL_TABLET | Freq: Four times a day (QID) | ORAL | 0 refills | Status: DC | PRN

## 2020-10-08 MED ORDER — MIDAZOLAM HCL 2 MG/2ML IJ SOLN
INTRAMUSCULAR | Status: AC
  Filled 2020-10-08: qty 2

## 2020-10-08 MED ORDER — ONDANSETRON HCL 4 MG/2ML IJ SOLN
INTRAMUSCULAR | Status: DC | PRN
  Administered 2020-10-08: 4 mg via INTRAVENOUS

## 2020-10-08 MED ORDER — LIDOCAINE 2% (20 MG/ML) 5 ML SYRINGE
INTRAMUSCULAR | Status: DC | PRN
  Administered 2020-10-08: 60 mg via INTRAVENOUS

## 2020-10-08 MED ORDER — FENTANYL CITRATE (PF) 100 MCG/2ML IJ SOLN
INTRAMUSCULAR | Status: DC | PRN
  Administered 2020-10-08: 50 ug via INTRAVENOUS

## 2020-10-08 MED ORDER — MIDAZOLAM HCL 5 MG/5ML IJ SOLN
INTRAMUSCULAR | Status: DC | PRN
  Administered 2020-10-08: 1 mg via INTRAVENOUS

## 2020-10-08 MED ORDER — 0.9 % SODIUM CHLORIDE (POUR BTL) OPTIME
TOPICAL | Status: DC | PRN
  Administered 2020-10-08: 1000 mL

## 2020-10-08 MED ORDER — HYDROMORPHONE HCL 1 MG/ML IJ SOLN
0.2500 mg | INTRAMUSCULAR | Status: DC | PRN
Start: 2020-10-08 — End: 2020-10-08

## 2020-10-08 MED ORDER — PROPOFOL 10 MG/ML IV BOLUS
INTRAVENOUS | Status: AC
  Filled 2020-10-08: qty 20

## 2020-10-08 SURGICAL SUPPLY — 40 items
BNDG ELASTIC 4X5.8 VLCR STR LF (GAUZE/BANDAGES/DRESSINGS) IMPLANT
BNDG ELASTIC 6X5.8 VLCR STR LF (GAUZE/BANDAGES/DRESSINGS) IMPLANT
BNDG GAUZE ELAST 4 BULKY (GAUZE/BANDAGES/DRESSINGS) ×4 IMPLANT
CANISTER SUCT 3000ML PPV (MISCELLANEOUS) ×2 IMPLANT
CLIP VESOCCLUDE MED 6/CT (CLIP) IMPLANT
CLIP VESOCCLUDE SM WIDE 6/CT (CLIP) IMPLANT
COVER SURGICAL LIGHT HANDLE (MISCELLANEOUS) ×2 IMPLANT
COVER WAND RF STERILE (DRAPES) IMPLANT
DRAPE EXTREMITY T 121X128X90 (DISPOSABLE) ×2 IMPLANT
DRAPE HALF SHEET 40X57 (DRAPES) IMPLANT
DRAPE U-SHAPE 76X120 STRL (DRAPES) IMPLANT
ELECT REM PT RETURN 9FT ADLT (ELECTROSURGICAL) ×2
ELECTRODE REM PT RTRN 9FT ADLT (ELECTROSURGICAL) ×1 IMPLANT
GAUZE SPONGE 4X4 12PLY STRL (GAUZE/BANDAGES/DRESSINGS) ×2 IMPLANT
GAUZE XEROFORM 5X9 LF (GAUZE/BANDAGES/DRESSINGS) IMPLANT
GLOVE SS BIOGEL STRL SZ 7.5 (GLOVE) ×1 IMPLANT
GLOVE SUPERSENSE BIOGEL SZ 7.5 (GLOVE) ×1
GOWN STRL REUS W/ TWL LRG LVL3 (GOWN DISPOSABLE) ×1 IMPLANT
GOWN STRL REUS W/ TWL XL LVL3 (GOWN DISPOSABLE) ×2 IMPLANT
GOWN STRL REUS W/TWL LRG LVL3 (GOWN DISPOSABLE) ×1
GOWN STRL REUS W/TWL XL LVL3 (GOWN DISPOSABLE) ×2
HANDPIECE INTERPULSE COAX TIP (DISPOSABLE)
IV NS IRRIG 3000ML ARTHROMATIC (IV SOLUTION) IMPLANT
KIT BASIN OR (CUSTOM PROCEDURE TRAY) ×2 IMPLANT
KIT TURNOVER KIT B (KITS) ×2 IMPLANT
NS IRRIG 1000ML POUR BTL (IV SOLUTION) ×2 IMPLANT
PACK CV ACCESS (CUSTOM PROCEDURE TRAY) IMPLANT
PACK GENERAL/GYN (CUSTOM PROCEDURE TRAY) ×2 IMPLANT
PACK UNIVERSAL I (CUSTOM PROCEDURE TRAY) ×2 IMPLANT
PAD ARMBOARD 7.5X6 YLW CONV (MISCELLANEOUS) ×4 IMPLANT
PULSAVAC PLUS IRRIG FAN TIP (DISPOSABLE)
SET HNDPC FAN SPRY TIP SCT (DISPOSABLE) IMPLANT
SUT ETHILON 3 0 PS 1 (SUTURE) IMPLANT
SUT VIC AB 2-0 CTX 36 (SUTURE) IMPLANT
SUT VIC AB 3-0 SH 27 (SUTURE)
SUT VIC AB 3-0 SH 27X BRD (SUTURE) IMPLANT
SUT VICRYL 4-0 PS2 18IN ABS (SUTURE) IMPLANT
TIP FAN IRRIG PULSAVAC PLUS (DISPOSABLE) IMPLANT
TOWEL GREEN STERILE (TOWEL DISPOSABLE) ×2 IMPLANT
WATER STERILE IRR 1000ML POUR (IV SOLUTION) ×2 IMPLANT

## 2020-10-08 NOTE — Anesthesia Postprocedure Evaluation (Signed)
Anesthesia Post Note  Patient: Jonathan Duke Sr.  Procedure(s) Performed: DEBRIDEMENT WOUND RIGHT FOOT (Right )     Patient location during evaluation: PACU Anesthesia Type: General Level of consciousness: awake Pain management: pain level controlled Vital Signs Assessment: post-procedure vital signs reviewed and stable Respiratory status: spontaneous breathing Cardiovascular status: stable Postop Assessment: no apparent nausea or vomiting Anesthetic complications: no   No complications documented.  Last Vitals:  Vitals:   10/08/20 0853 10/08/20 0855  BP: 106/74 116/66  Pulse:    Resp: (!) 22 17  Temp:    SpO2:      Last Pain:  Vitals:   10/08/20 0850  PainSc: 0-No pain                 Traver Meckes

## 2020-10-08 NOTE — Anesthesia Procedure Notes (Signed)
Procedure Name: LMA Insertion Date/Time: 10/08/2020 7:44 AM Performed by: Trinna Post., CRNA Pre-anesthesia Checklist: Patient identified, Emergency Drugs available, Suction available, Patient being monitored and Timeout performed Patient Re-evaluated:Patient Re-evaluated prior to induction Oxygen Delivery Method: Circle system utilized Preoxygenation: Pre-oxygenation with 100% oxygen Induction Type: IV induction Ventilation: Mask ventilation without difficulty LMA: LMA inserted LMA Size: 4.0 Number of attempts: 1 Placement Confirmation: positive ETCO2 and breath sounds checked- equal and bilateral Tube secured with: Tape Dental Injury: Teeth and Oropharynx as per pre-operative assessment

## 2020-10-08 NOTE — Interval H&P Note (Signed)
History and Physical Interval Note:  10/08/2020 7:25 AM  Jonathan Alexandria Sr.  has presented today for surgery, with the diagnosis of non viable tissue.  The various methods of treatment have been discussed with the patient and family. After consideration of risks, benefits and other options for treatment, the patient has consented to  Procedure(s): DEBRIDEMENT WOUND RIGHT FOOT (Right) as a surgical intervention.  The patient's history has been reviewed, patient examined, no change in status, stable for surgery.  I have reviewed the patient's chart and labs.  Questions were answered to the patient's satisfaction.     [name]

## 2020-10-08 NOTE — Op Note (Signed)
DATE OF SERVICE: 10/08/2020  PATIENT:  Jonathan Alexandria Sr.  70 y.o. 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  TOURNIQUET:  none  PATIENT DISPOSITION:  PACU - hemodynamically stable.   Delay start of Pharmacological VTE agent (>24hrs) due to surgical blood loss or risk of bleeding: no  INDICATION FOR PROCEDURE: [name] is a 70 y.o. male with poorly healing right fifth toe amputation. After careful discussion of risks, benefits, and alternatives the patient was offered debridement of the wound. The patient understood and wished to proceed.  OPERATIVE FINDINGS: Moderate volume of necrotized fat and fibrinous exudate debrided sharply. Healthy bleeding tissue at skin margins.   DESCRIPTION OF PROCEDURE: After identification of the patient in the pre-operative holding area, the patient was transferred to the operating room. The patient was positioned supine on the operating room table. Anesthesia was induced. The right foot was prepped and draped in standard fashion. A surgical pause was performed confirming correct patient, procedure, and operative location.  Two wounds of the right lateral foot were identified.  These were sharply debrided to healthy tissue.  Moderate volume of necrotized fat Everts exudate was removed from the wounds.  The fourth metatarsal was noted to be adjacent to the margin of both wounds.  After sharp debridement, the final dimensions of the larger burn were 5 x 3.5 x 1 cm.  The smaller wound measured 2.5 x 2 x 1 cm.  The wound was dressed  wet-to-dry.  Upon completion of the case instrument and sharps counts were confirmed correct. The patient was transferred to the PACU in good condition. I was present for all portions of the procedure.  [name], MD [facility] Phone Number: [phone] 10/08/2020 8:27 AM

## 2020-10-08 NOTE — Discharge Instructions (Signed)
Wet to dry dressing changes BID

## 2020-10-08 NOTE — Transfer of Care (Signed)
Immediate Anesthesia Transfer of Care Note  Patient: Jonathan Lilja Sr.  Procedure(s) Performed: DEBRIDEMENT WOUND RIGHT FOOT (Right )  Patient Location: PACU  Anesthesia Type:General  Level of Consciousness: awake and drowsy  Airway & Oxygen Therapy: Patient Spontanous Breathing and Patient connected to face mask oxygen  Post-op Assessment: Report given to RN and Post -op Vital signs reviewed and stable  Post vital signs: Reviewed and stable  Last Vitals:  Vitals Value Taken Time  BP 103/53 10/08/20 0825  Temp    Pulse    Resp 9 10/08/20 0828  SpO2    Vitals shown include unvalidated device data.  Last Pain:  Vitals:   10/08/20 0635  PainSc: 0-No pain         Complications: No complications documented.

## 2020-10-08 NOTE — Addendum Note (Signed)
Addendum  created 10/08/20 [id number] by Belinda Block, MD   Clinical Note Signed

## 2020-10-09 ENCOUNTER — Encounter (HOSPITAL_COMMUNITY): Payer: Self-pay | Admitting: Vascular Surgery

## 2020-10-20 ENCOUNTER — Encounter: Payer: Self-pay | Admitting: Vascular Surgery

## 2020-10-20 ENCOUNTER — Other Ambulatory Visit: Payer: Self-pay

## 2020-10-20 ENCOUNTER — Ambulatory Visit (INDEPENDENT_AMBULATORY_CARE_PROVIDER_SITE_OTHER): Payer: Self-pay | Admitting: Vascular Surgery

## 2020-10-20 VITALS — BP 126/74 | HR 91 | Temp 97.0°F | Ht 72.0 in | Wt 143.0 lb

## 2020-10-20 DIAGNOSIS — I70235 Atherosclerosis of native arteries of right leg with ulceration of other part of foot: Secondary | ICD-10-CM

## 2020-10-20 NOTE — Progress Notes (Signed)
   ASSESSMENT & PLAN:  Ojani Farnam Sr. is a 70 y.o. male with atherosclerosis of native arteries of right lower extremity causing ulceration status post: 08/05/20: R SFA stenting 08/07/20: R iliofemoral endarterectomy and fifth toe ray amputation 09/09/20: R external iliac stenting and popliteal angioplasty 10/08/20: debridement of right fifth toe amputation ulceration  His right foot continues to deteriorate, unfortunately. He is maximally revascularized Will need local wound care and observation. Recommend betadine paint and clean dressing daily. Follow up with me in 1-2 weeks  SUBJECTIVE:  Right foot wound not healing despite local wound care.   OBJECTIVE:  BP 126/74 (BP Location: Left Arm, Patient Position: Sitting, Cuff Size: Normal)   Pulse 91   Temp (!) 97 F (36.1 C) (Temporal)   Ht 6' (1.829 m)   Wt 143 lb (64.9 kg)   SpO2 97%   BMI 19.39 kg/m    R lateral foot with two areas of ulceration, they appear worse than at operative debridement No palpable pedal pulses       CBC Latest Ref Rng & Units 10/08/2020 09/09/2020 08/10/2020  WBC 4.0 - 10.5 K/uL - - 10.7(H)  Hemoglobin 13.0 - 17.0 g/dL 15.0 15.3 8.6(L)  Hematocrit 39.0 - 52.0 % 44.0 45.0 25.6(L)  Platelets 150 - 400 K/uL - - 163     CMP Latest Ref Rng & Units 10/08/2020 09/09/2020 08/10/2020  Glucose 70 - 99 mg/dL 87 98 109(H)  BUN 8 - 23 mg/dL 37(H) 30(H) 45(H)  Creatinine 0.61 - 1.24 mg/dL 5.20(H) 6.80(H) 7.79(H)  Sodium 135 - 145 mmol/L 133(L) 137 137  Potassium 3.5 - 5.1 mmol/L 4.0 4.2 3.4(L)  Chloride 98 - 111 mmol/L 95(L) 100 98  CO2 22 - 32 mmol/L - - 26  Calcium 8.9 - 10.3 mg/dL - - 8.6(L)  Total Protein 6.5 - 8.1 g/dL - - -  Total Bilirubin 0.3 - 1.2 mg/dL - - -  Alkaline Phos 38 - 126 U/L - - -  AST 15 - 41 U/L - - -  ALT 0 - 44 U/L - - -    Estimated Creatinine Clearance: 12.7 mL/min (A) (by C-G formula based on SCr of 5.2 mg/dL (H)).       [name], MD [facility] Phone Number: [phone] 10/20/2020 11:32 AM

## 2020-10-23 ENCOUNTER — Telehealth: Payer: Self-pay | Admitting: Licensed Clinical Social Worker

## 2020-10-23 NOTE — Telephone Encounter (Signed)
CSW received referral from [facility] staff to assist patient with counseling/supportive intervention around issues of coping with upcoming surgery/amputation. CSW spoke with patient's wife who asked for some contacts of patients who have had similar experiences. Wife also asked about some resources for patient regarding amputees and rehab options. CSW will reach out to Rehab CSW to see about potential resources and possible amputee support groups. Wife grateful for the support and information. CSW will return call with further information when obtained. Raquel Sarna, [place], [place]

## 2020-10-26 ENCOUNTER — Telehealth: Payer: Self-pay

## 2020-10-26 NOTE — Telephone Encounter (Signed)
Received call from [facility] with [facility]. Gave verbal orders to continue with skilled nursing care for the next 6 weeks, paint wound with betadine and cover with clean bandage per TH note. He will keep follow up appt with [place] next week.

## 2020-10-27 ENCOUNTER — Telehealth: Payer: Self-pay | Admitting: Licensed Clinical Social Worker

## 2020-10-27 NOTE — Telephone Encounter (Signed)
CSW contacted wife to provide contact for Amputee Support Group  Jonathan [name] [phone] who will be able to assist with further resources and peer to peer support as requested by wife and patient. Wife grateful for the support and contact information. CSW available if further needs arise.Raquel Sarna, [place], [place]

## 2020-10-30 ENCOUNTER — Inpatient Hospital Stay (HOSPITAL_COMMUNITY)
Admission: EM | Admit: 2020-10-30 | Discharge: 2020-11-06 | DRG: 239 | Disposition: A | Discharge status: Skilled Nursing Facility | Payer: Medicare Other | Attending: Internal Medicine | Admitting: Internal Medicine

## 2020-10-30 ENCOUNTER — Encounter (HOSPITAL_COMMUNITY): Payer: Self-pay | Admitting: Emergency Medicine

## 2020-10-30 ENCOUNTER — Emergency Department (HOSPITAL_COMMUNITY): Payer: Medicare Other

## 2020-10-30 DIAGNOSIS — E1122 Type 2 diabetes mellitus with diabetic chronic kidney disease: Secondary | ICD-10-CM | POA: Diagnosis present

## 2020-10-30 DIAGNOSIS — Z7901 Long term (current) use of anticoagulants: Secondary | ICD-10-CM | POA: Diagnosis not present

## 2020-10-30 DIAGNOSIS — I132 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 kidney disease, or end stage renal disease: Secondary | ICD-10-CM | POA: Diagnosis present

## 2020-10-30 DIAGNOSIS — Y830 Surgical operation with transplant of whole organ as the cause of abnormal reaction of the patient, or of later complication, without mention of misadventure at the time of the procedure: Secondary | ICD-10-CM | POA: Diagnosis present

## 2020-10-30 DIAGNOSIS — Z905 Acquired absence of kidney: Secondary | ICD-10-CM | POA: Diagnosis not present

## 2020-10-30 DIAGNOSIS — Z87891 Personal history of nicotine dependence: Secondary | ICD-10-CM

## 2020-10-30 DIAGNOSIS — Z89421 Acquired absence of other right toe(s): Secondary | ICD-10-CM

## 2020-10-30 DIAGNOSIS — N2581 Secondary hyperparathyroidism of renal origin: Secondary | ICD-10-CM | POA: Diagnosis present

## 2020-10-30 DIAGNOSIS — D631 Anemia in chronic kidney disease: Secondary | ICD-10-CM | POA: Diagnosis present

## 2020-10-30 DIAGNOSIS — M86171 Other acute osteomyelitis, right ankle and foot: Secondary | ICD-10-CM | POA: Diagnosis not present

## 2020-10-30 DIAGNOSIS — M869 Osteomyelitis, unspecified: Secondary | ICD-10-CM

## 2020-10-30 DIAGNOSIS — N186 End stage renal disease: Secondary | ICD-10-CM | POA: Diagnosis present

## 2020-10-30 DIAGNOSIS — D689 Coagulation defect, unspecified: Secondary | ICD-10-CM | POA: Diagnosis not present

## 2020-10-30 DIAGNOSIS — Z794 Long term (current) use of insulin: Secondary | ICD-10-CM

## 2020-10-30 DIAGNOSIS — E1152 Type 2 diabetes mellitus with diabetic peripheral angiopathy with gangrene: Secondary | ICD-10-CM | POA: Diagnosis present

## 2020-10-30 DIAGNOSIS — Z833 Family history of diabetes mellitus: Secondary | ICD-10-CM | POA: Diagnosis not present

## 2020-10-30 DIAGNOSIS — I5022 Chronic systolic (congestive) heart failure: Secondary | ICD-10-CM | POA: Diagnosis present

## 2020-10-30 DIAGNOSIS — M868X7 Other osteomyelitis, ankle and foot: Secondary | ICD-10-CM | POA: Diagnosis present

## 2020-10-30 DIAGNOSIS — R791 Abnormal coagulation profile: Secondary | ICD-10-CM | POA: Diagnosis not present

## 2020-10-30 DIAGNOSIS — Z20822 Contact with and (suspected) exposure to covid-19: Secondary | ICD-10-CM | POA: Diagnosis present

## 2020-10-30 DIAGNOSIS — I2510 Atherosclerotic heart disease of native coronary artery without angina pectoris: Secondary | ICD-10-CM | POA: Diagnosis present

## 2020-10-30 DIAGNOSIS — Z7982 Long term (current) use of aspirin: Secondary | ICD-10-CM

## 2020-10-30 DIAGNOSIS — E1169 Type 2 diabetes mellitus with other specified complication: Secondary | ICD-10-CM | POA: Diagnosis present

## 2020-10-30 DIAGNOSIS — L0889 Other specified local infections of the skin and subcutaneous tissue: Secondary | ICD-10-CM | POA: Diagnosis present

## 2020-10-30 DIAGNOSIS — Z992 Dependence on renal dialysis: Secondary | ICD-10-CM

## 2020-10-30 DIAGNOSIS — Z95820 Peripheral vascular angioplasty status with implants and grafts: Secondary | ICD-10-CM

## 2020-10-30 DIAGNOSIS — E160 Drug-induced hypoglycemia without coma: Secondary | ICD-10-CM | POA: Diagnosis not present

## 2020-10-30 DIAGNOSIS — I70261 Atherosclerosis of native arteries of extremities with gangrene, right leg: Secondary | ICD-10-CM | POA: Diagnosis present

## 2020-10-30 DIAGNOSIS — K921 Melena: Secondary | ICD-10-CM | POA: Diagnosis not present

## 2020-10-30 DIAGNOSIS — I639 Cerebral infarction, unspecified: Secondary | ICD-10-CM

## 2020-10-30 DIAGNOSIS — E785 Hyperlipidemia, unspecified: Secondary | ICD-10-CM | POA: Diagnosis present

## 2020-10-30 DIAGNOSIS — Z7952 Long term (current) use of systemic steroids: Secondary | ICD-10-CM

## 2020-10-30 DIAGNOSIS — L97819 Non-pressure chronic ulcer of other part of right lower leg with unspecified severity: Secondary | ICD-10-CM | POA: Diagnosis present

## 2020-10-30 DIAGNOSIS — I96 Gangrene, not elsewhere classified: Secondary | ICD-10-CM | POA: Diagnosis not present

## 2020-10-30 DIAGNOSIS — I252 Old myocardial infarction: Secondary | ICD-10-CM | POA: Diagnosis not present

## 2020-10-30 DIAGNOSIS — L97519 Non-pressure chronic ulcer of other part of right foot with unspecified severity: Secondary | ICD-10-CM | POA: Diagnosis present

## 2020-10-30 DIAGNOSIS — F0150 Vascular dementia without behavioral disturbance: Secondary | ICD-10-CM

## 2020-10-30 DIAGNOSIS — T8612 Kidney transplant failure: Secondary | ICD-10-CM | POA: Diagnosis present

## 2020-10-30 DIAGNOSIS — T383X5A Adverse effect of insulin and oral hypoglycemic [antidiabetic] drugs, initial encounter: Secondary | ICD-10-CM | POA: Diagnosis not present

## 2020-10-30 DIAGNOSIS — Z8673 Personal history of transient ischemic attack (TIA), and cerebral infarction without residual deficits: Secondary | ICD-10-CM | POA: Diagnosis not present

## 2020-10-30 DIAGNOSIS — F0390 Unspecified dementia without behavioral disturbance: Secondary | ICD-10-CM | POA: Diagnosis present

## 2020-10-30 DIAGNOSIS — M8660 Other chronic osteomyelitis, unspecified site: Secondary | ICD-10-CM | POA: Diagnosis not present

## 2020-10-30 DIAGNOSIS — Z79899 Other long term (current) drug therapy: Secondary | ICD-10-CM

## 2020-10-30 DIAGNOSIS — E11649 Type 2 diabetes mellitus with hypoglycemia without coma: Secondary | ICD-10-CM | POA: Diagnosis not present

## 2020-10-30 DIAGNOSIS — I480 Paroxysmal atrial fibrillation: Secondary | ICD-10-CM | POA: Diagnosis present

## 2020-10-30 DIAGNOSIS — I513 Intracardiac thrombosis, not elsewhere classified: Secondary | ICD-10-CM | POA: Diagnosis not present

## 2020-10-30 DIAGNOSIS — T45515A Adverse effect of anticoagulants, initial encounter: Secondary | ICD-10-CM | POA: Diagnosis not present

## 2020-10-30 DIAGNOSIS — E119 Type 2 diabetes mellitus without complications: Secondary | ICD-10-CM | POA: Diagnosis not present

## 2020-10-30 DIAGNOSIS — G8190 Hemiplegia, unspecified affecting unspecified side: Secondary | ICD-10-CM

## 2020-10-30 DIAGNOSIS — R627 Adult failure to thrive: Secondary | ICD-10-CM | POA: Diagnosis not present

## 2020-10-30 LAB — COMPREHENSIVE METABOLIC PANEL
ALT: 10 U/L (ref 0–44)
AST: 12 U/L — ABNORMAL LOW (ref 15–41)
Albumin: 3.1 g/dL — ABNORMAL LOW (ref 3.5–5.0)
Alkaline Phosphatase: 49 U/L (ref 38–126)
Anion gap: 11 (ref 5–15)
BUN: 52 mg/dL — ABNORMAL HIGH (ref 8–23)
CO2: 31 mmol/L (ref 22–32)
Calcium: 9.6 mg/dL (ref 8.9–10.3)
Chloride: 97 mmol/L — ABNORMAL LOW (ref 98–111)
Creatinine, Ser: 8.3 mg/dL — ABNORMAL HIGH (ref 0.61–1.24)
GFR, Estimated: 7 mL/min — ABNORMAL LOW (ref >60)
Glucose, Bld: 191 mg/dL — ABNORMAL HIGH (ref 70–99)
Potassium: 3.8 mmol/L (ref 3.5–5.1)
Sodium: 139 mmol/L (ref 135–145)
Total Bilirubin: 0.6 mg/dL (ref 0.3–1.2)
Total Protein: 6.6 g/dL (ref 6.5–8.1)

## 2020-10-30 LAB — CBC WITH DIFFERENTIAL/PLATELET
Abs Immature Granulocytes: 0.05 10*3/uL (ref 0.00–0.07)
Basophils Absolute: 0.1 10*3/uL (ref 0.0–0.1)
Basophils Relative: 0 %
Eosinophils Absolute: 0 10*3/uL (ref 0.0–0.5)
Eosinophils Relative: 0 %
HCT: 33.6 % — ABNORMAL LOW (ref 39.0–52.0)
Hemoglobin: 10.3 g/dL — ABNORMAL LOW (ref 13.0–17.0)
Immature Granulocytes: 0 %
Lymphocytes Relative: 12 %
Lymphs Abs: 1.4 10*3/uL (ref 0.7–4.0)
MCH: 29.9 pg (ref 26.0–34.0)
MCHC: 30.7 g/dL (ref 30.0–36.0)
MCV: 97.7 fL (ref 80.0–100.0)
Monocytes Absolute: 0.9 10*3/uL (ref 0.1–1.0)
Monocytes Relative: 7 %
Neutro Abs: 9.5 10*3/uL — ABNORMAL HIGH (ref 1.7–7.7)
Neutrophils Relative %: 81 %
Platelets: 255 10*3/uL (ref 150–400)
RBC: 3.44 MIL/uL — ABNORMAL LOW (ref 4.22–5.81)
RDW: 18.9 % — ABNORMAL HIGH (ref 11.5–15.5)
WBC: 11.9 10*3/uL — ABNORMAL HIGH (ref 4.0–10.5)
nRBC: 0 % (ref 0.0–0.2)

## 2020-10-30 LAB — PROTIME-INR
INR: 5.4 (ref 0.8–1.2)
Prothrombin Time: 48 seconds — ABNORMAL HIGH (ref 11.4–15.2)

## 2020-10-30 LAB — GLUCOSE, CAPILLARY
Glucose-Capillary: 117 mg/dL — ABNORMAL HIGH (ref 70–99)
Glucose-Capillary: 178 mg/dL — ABNORMAL HIGH (ref 70–99)

## 2020-10-30 LAB — HEMOGLOBIN A1C
Hgb A1c MFr Bld: 5.7 % — ABNORMAL HIGH (ref 4.8–5.6)
Mean Plasma Glucose: 116.89 mg/dL

## 2020-10-30 LAB — HIV ANTIBODY (ROUTINE TESTING W REFLEX): HIV Screen 4th Generation wRfx: NONREACTIVE

## 2020-10-30 LAB — RESP PANEL BY RT-PCR (FLU A&B, COVID) ARPGX2
Influenza A by PCR: NEGATIVE
Influenza B by PCR: NEGATIVE
SARS Coronavirus 2 by RT PCR: NEGATIVE

## 2020-10-30 LAB — LACTIC ACID, PLASMA: Lactic Acid, Venous: 1.6 mmol/L (ref 0.5–1.9)

## 2020-10-30 MED ORDER — SODIUM CHLORIDE 0.9 % IV SOLN
2.0000 g | Freq: Once | INTRAVENOUS | Status: AC
  Administered 2020-10-30: 2 g via INTRAVENOUS
  Filled 2020-10-30: qty 2

## 2020-10-30 MED ORDER — CLOPIDOGREL BISULFATE 75 MG PO TABS: 75.0000 mg | ORAL_TABLET | Freq: Every day | ORAL | Status: DC

## 2020-10-30 MED ORDER — METRONIDAZOLE IN NACL 5-0.79 MG/ML-% IV SOLN
500.0000 mg | Freq: Once | INTRAVENOUS | Status: AC
  Administered 2020-10-30: 500 mg via INTRAVENOUS
  Filled 2020-10-30 (×2): qty 100

## 2020-10-30 MED ORDER — HEPARIN SODIUM (PORCINE) 5000 UNIT/ML IJ SOLN: 5000.0000 [IU] | Freq: Three times a day (TID) | INTRAMUSCULAR | Status: DC

## 2020-10-30 MED ORDER — ALLOPURINOL 100 MG PO TABS
100.0000 mg | ORAL_TABLET | Freq: Every day | ORAL | Status: DC
  Administered 2020-10-31 – 2020-11-06 (×6): 100 mg via ORAL
  Filled 2020-10-30 (×7): qty 1

## 2020-10-30 MED ORDER — VANCOMYCIN HCL 1500 MG/300ML IV SOLN
1500.0000 mg | Freq: Once | INTRAVENOUS | Status: AC
  Administered 2020-10-30: 1500 mg via INTRAVENOUS
  Filled 2020-10-30: qty 300

## 2020-10-30 MED ORDER — VANCOMYCIN HCL 750 MG/150ML IV SOLN
750.0000 mg | INTRAVENOUS | Status: DC
  Filled 2020-10-30: qty 150

## 2020-10-30 MED ORDER — SODIUM CHLORIDE 0.9 % IV SOLN
1.0000 g | INTRAVENOUS | Status: DC
  Administered 2020-10-31 – 2020-11-02 (×3): 1 g via INTRAVENOUS
  Filled 2020-10-30 (×3): qty 1

## 2020-10-30 MED ORDER — METOCLOPRAMIDE HCL 10 MG PO TABS: 5.0000 mg | ORAL_TABLET | Freq: Three times a day (TID) | ORAL | Status: DC | PRN

## 2020-10-30 MED ORDER — RENA-VITE PO TABS
1.0000 | ORAL_TABLET | Freq: Every day | ORAL | Status: DC
  Administered 2020-10-31 – 2020-11-06 (×7): 1 via ORAL
  Filled 2020-10-30 (×8): qty 1

## 2020-10-30 MED ORDER — LIDOCAINE-PRILOCAINE 2.5-2.5 % EX CREA
1.0000 "application " | TOPICAL_CREAM | CUTANEOUS | Status: DC
  Filled 2020-10-30: qty 5

## 2020-10-30 MED ORDER — CHLORHEXIDINE GLUCONATE CLOTH 2 % EX PADS
6.0000 | MEDICATED_PAD | Freq: Every day | CUTANEOUS | Status: DC
  Administered 2020-10-31 – 2020-11-04 (×4): 6 via TOPICAL

## 2020-10-30 MED ORDER — LEVALBUTEROL HCL 0.63 MG/3ML IN NEBU
0.6300 mg | INHALATION_SOLUTION | Freq: Three times a day (TID) | RESPIRATORY_TRACT | Status: DC | PRN
  Filled 2020-10-30: qty 3

## 2020-10-30 MED ORDER — PREDNISONE 5 MG PO TABS
5.0000 mg | ORAL_TABLET | Freq: Every day | ORAL | Status: DC
  Administered 2020-10-31 – 2020-11-06 (×6): 5 mg via ORAL
  Filled 2020-10-30 (×7): qty 1

## 2020-10-30 MED ORDER — SENNOSIDES-DOCUSATE SODIUM 8.6-50 MG PO TABS: 1.0000 | ORAL_TABLET | Freq: Every day | ORAL | Status: DC | PRN

## 2020-10-30 MED ORDER — VITAMIN K1 10 MG/ML IJ SOLN
1.0000 mg | Freq: Once | INTRAVENOUS | Status: AC
  Administered 2020-10-30: 1 mg via INTRAVENOUS
  Filled 2020-10-30: qty 0.1

## 2020-10-30 MED ORDER — ASPIRIN 81 MG PO CHEW
81.0000 mg | CHEWABLE_TABLET | Freq: Every day | ORAL | Status: DC
Start: 2020-10-31 — End: 2020-10-30

## 2020-10-30 MED ORDER — HYDROMORPHONE HCL 1 MG/ML IJ SOLN
0.5000 mg | INTRAMUSCULAR | Status: DC | PRN
  Administered 2020-11-03: 0.5 mg via INTRAVENOUS

## 2020-10-30 MED ORDER — ATORVASTATIN CALCIUM 40 MG PO TABS
40.0000 mg | ORAL_TABLET | Freq: Every day | ORAL | Status: DC
  Administered 2020-10-31 – 2020-11-06 (×6): 40 mg via ORAL
  Filled 2020-10-30 (×7): qty 1

## 2020-10-30 MED ORDER — SEVELAMER CARBONATE 800 MG PO TABS
800.0000 mg | ORAL_TABLET | Freq: Three times a day (TID) | ORAL | Status: DC
  Administered 2020-10-30 – 2020-11-06 (×17): 800 mg via ORAL
  Filled 2020-10-30 (×17): qty 1

## 2020-10-30 MED ORDER — OXYCODONE-ACETAMINOPHEN 5-325 MG PO TABS
1.0000 | ORAL_TABLET | Freq: Four times a day (QID) | ORAL | Status: DC | PRN
  Administered 2020-10-30 – 2020-11-04 (×4): 1 via ORAL
  Filled 2020-10-30 (×5): qty 1

## 2020-10-30 MED ORDER — INSULIN ASPART 100 UNIT/ML ~~LOC~~ SOLN
0.0000 [IU] | Freq: Three times a day (TID) | SUBCUTANEOUS | Status: DC
  Administered 2020-10-31: 1 [IU] via SUBCUTANEOUS
  Administered 2020-11-02: 5 [IU] via SUBCUTANEOUS
  Administered 2020-11-02: 1 [IU] via SUBCUTANEOUS
  Administered 2020-11-05: 2 [IU] via SUBCUTANEOUS
  Administered 2020-11-05 – 2020-11-06 (×2): 1 [IU] via SUBCUTANEOUS

## 2020-10-30 MED ORDER — BOOST HIGH PROTEIN PO LIQD
1.0000 | Freq: Every day | ORAL | Status: DC | PRN
  Filled 2020-10-30: qty 237

## 2020-10-30 NOTE — Consult Note (Signed)
[facility]  ASSESSMENT / PLAN: 70 y.o. 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 I again offered the patient below knee amputation which he is willing to accept now. Will arrange to do this Monday 11/01/20, likely with Dr. Carlis Abbott.   CHIEF COMPLAINT: progressively worsening right foot wound  HISTORY OF PRESENT ILLNESS: Jonathan Kosik Sr. is a 70 y.o. male very well known to me. We have been trying to salvage his right foot since December 2021. He presents today to the ER with worsening pain, swelling, warmth and drainage from the foot. I last saw him 10/20/20. At that time I recommended he proceed with below knee amputation. He was not willing to consider this at the time, and wished to continue local wound care. He was scheduled to see me next Tuesday, but felt he could not wait longer.   VASCULAR SURGICAL HISTORY:  08/05/20: R SFA stenting 08/07/20: R iliofemoral endarterectomy and fifth toe ray amputation 09/09/20: R external iliac stenting and popliteal angioplasty 10/08/20: debridement of right fifth toe amputation ulceration  Past Medical History:  Diagnosis Date  . Diabetes mellitus without complication ([place])    type 2  . FUO (fever of unknown origin) 09/17/2019  . Hypertension   . Peripheral arterial disease ([place])   . Renal disorder 2015   right kidney transplant  . Stroke [facility])     Past Surgical History:  Procedure Laterality Date  . ABDOMINAL AORTOGRAM N/A 08/05/2020   Procedure: ABDOMINAL AORTOGRAM;  Surgeon: [name], MD;  Location: [place] [facility];  Service: Cardiovascular;  Laterality: N/A;  . ABDOMINAL AORTOGRAM W/LOWER EXTREMITY N/A 09/09/2020   Procedure: ABDOMINAL AORTOGRAM W/LOWER EXTREMITY;  Surgeon: [name], MD;  Location: [place] [facility];  Service: Cardiovascular;  Laterality: N/A;  . AMPUTATION Right 08/06/2020   Procedure: Right Fifth Toe  Ray Amputation;  Surgeon: [name], MD;  Location: [place];  Service: Vascular;  Laterality: Right;  . BACK SURGERY     Has had 2 back surgeries  . BUBBLE STUDY  09/18/2019   Procedure: BUBBLE STUDY;  Surgeon: Buford Dresser, MD;  Location: [facility] ENDOSCOPY;  Service: Cardiovascular;;  . Depression    . ENDARTERECTOMY FEMORAL Right 08/06/2020   Procedure: Right Ilio- Femoral Artery Endarterectomy;  Surgeon: [name], MD;  Location: [place];  Service: Vascular;  Laterality: Right;  . HAND SURGERY Right   . HAND SURGERY    . IR REMOVAL TUN CV CATH W/O FL  09/03/2019  . KIDNEY TRANSPLANT     November 2015  . LOWER EXTREMITY ANGIOGRAPHY Bilateral 08/05/2020   Procedure: LOWER EXTREMITY ANGIOGRAPHY;  Surgeon: [name], MD;  Location: [place] [facility];  Service: Cardiovascular;  Laterality: Bilateral;  . Memory loss    . NEPHRECTOMY TRANSPLANTED ORGAN    . PATCH ANGIOPLASTY Right 08/06/2020   Procedure: PATCH ANGIOPLASTY;  Surgeon: [name], MD;  Location: [place];  Service: Vascular;  Laterality: Right;  . PERIPHERAL VASCULAR BALLOON ANGIOPLASTY Right 09/09/2020   Procedure: PERIPHERAL VASCULAR BALLOON ANGIOPLASTY;  Surgeon: [name], MD;  Location: [place] [facility];  Service: Cardiovascular;  Laterality: Right;  popliteal  . PERIPHERAL VASCULAR INTERVENTION Right 08/05/2020   Procedure: PERIPHERAL VASCULAR INTERVENTION;  Surgeon: [name], MD;  Location: [place] [facility];  Service: Cardiovascular;  Laterality: Right;  SFA  . PERIPHERAL VASCULAR INTERVENTION Right 09/09/2020  Procedure: PERIPHERAL VASCULAR INTERVENTION;  Surgeon: [name], MD;  Location: [place] [facility];  Service: Cardiovascular;  Laterality: Right;  external iliac  . TEE WITHOUT CARDIOVERSION N/A 09/18/2019   Procedure: TRANSESOPHAGEAL ECHOCARDIOGRAM (TEE);  Surgeon: Buford Dresser, MD;  Location: [facility] ENDOSCOPY;  Service: Cardiovascular;  Laterality: N/A;  . WOUND  DEBRIDEMENT Right 10/08/2020   Procedure: DEBRIDEMENT WOUND RIGHT FOOT;  Surgeon: [name], MD;  Location: [facility] OR;  Service: Vascular;  Laterality: Right;    Family History  Problem Relation Age of Onset  . Diabetes Mellitus II Mother   . Diabetes Mellitus II Brother     Social History   Socioeconomic History  . Marital status: Married    Spouse name: Not on file  . Number of children: Not on file  . Years of education: Not on file  . Highest education level: Not on file  Occupational History  . Not on file  Tobacco Use  . Smoking status: Former Research scientist (life sciences)  . Smokeless tobacco: Never Used  Vaping Use  . Vaping Use: Never used  Substance and Sexual Activity  . Alcohol use: Never  . Drug use: Never  . Sexual activity: Not on file  Other Topics Concern  . Not on file  Social History Narrative  . Not on file   Social Determinants of Health   Financial Resource Strain: Not on file  Food Insecurity: Not on file  Transportation Needs: Not on file  Physical Activity: Not on file  Stress: Not on file  Social Connections: Not on file  Intimate Partner Violence: Not on file    No Known Allergies  Current Facility-Administered Medications  Medication Dose Route Frequency Provider Last Rate Last Admin  . allopurinol (ZYLOPRIM) tablet 100 mg  100 mg Oral Daily Wynetta Fines T, MD      . aspirin chewable tablet 81 mg  81 mg Oral Daily Wynetta Fines T, MD      . atorvastatin (LIPITOR) tablet 40 mg  40 mg Oral Daily Wynetta Fines T, MD      . Derrill Memo ON 10/31/2020] ceFEPIme (MAXIPIME) 1 g in sodium chloride 0.9 % 100 mL IVPB  1 g Intravenous Q24H Bertis Ruddy, RPH      . [START ON 10/31/2020] clopidogrel (PLAVIX) tablet 75 mg  75 mg Oral Q breakfast Wynetta Fines T, MD      . feeding supplement (BOOST HIGH PROTEIN) liquid 237 mL  1 Container Oral Daily PRN Wynetta Fines T, MD      . heparin injection 5,000 Units  5,000 Units Subcutaneous Q8H Wynetta Fines T, MD      . HYDROmorphone  (DILAUDID) injection 0.5-1 mg  0.5-1 mg Intravenous Q2H PRN Wynetta Fines T, MD      . insulin aspart (novoLOG) injection 0-6 Units  0-6 Units Subcutaneous TID WC Wynetta Fines T, MD      . levalbuterol [facility] HFA) inhaler 2 puff  2 puff Inhalation Q8H PRN Wynetta Fines T, MD      . lidocaine-prilocaine (EMLA) cream 1 application  1 application Topical Once per day on Mon Wed Fri Zhang, Ping T, MD      . metoCLOPramide (REGLAN) tablet 5 mg  5 mg Oral TID PRN Wynetta Fines T, MD      . multivitamin (RENA-VIT) tablet 1 tablet  1 tablet Oral QHS Wynetta Fines T, MD      . oxyCODONE-acetaminophen (PERCOCET/ROXICET) 5-325 MG per tablet 1 tablet  1 tablet Oral Q6H  PRN Lequita Halt, MD      . Derrill Memo ON 10/31/2020] predniSONE (DELTASONE) tablet 5 mg  5 mg Oral Q breakfast Wynetta Fines T, MD      . senna-docusate (Senokot-S) tablet 1 tablet  1 tablet Oral Daily PRN Wynetta Fines T, MD      . sevelamer carbonate (RENVELA) tablet 800 mg  800 mg Oral TID WC Wynetta Fines T, MD      . vancomycin (VANCOREADY) IVPB 1500 mg/300 mL  1,500 mg Intravenous Once Couture, Cortni S, PA-C 150 mL/hr at 10/30/20 1430 1,500 mg at 10/30/20 1430  . [START ON 11/02/2020] vancomycin (VANCOREADY) IVPB 750 mg/150 mL  750 mg Intravenous Q M,W,F-HD Bertis Ruddy, [facility]       Current Outpatient Medications  Medication Sig Dispense Refill  . allopurinol (ZYLOPRIM) 100 MG tablet Take 100 mg by mouth daily.     Marland Kitchen aspirin 81 MG chewable tablet Chew 81 mg by mouth daily.     Marland Kitchen atorvastatin (LIPITOR) 40 MG tablet Take 40 mg by mouth daily.    Marland Kitchen b complex-vitamin c-folic acid (NEPHRO-VITE) 0.8 MG TABS tablet Take 1 tablet by mouth daily.    . clopidogrel (PLAVIX) 75 MG tablet Take 1 tablet (75 mg total) by mouth daily with breakfast. 30 tablet 3  . feeding supplement (BOOST HIGH PROTEIN) LIQD Take 1 Container by mouth daily as needed (nutrition).    . insulin aspart protamine- aspart (NOVOLOG MIX 70/30) (70-30) 100 UNIT/ML injection Inject 0-10 Units  into the skin daily as needed (high blood sugar).    . Insulin Pen Needle 32G X 4 MM MISC 1 each by Other route as needed (insulin).     Marland Kitchen levalbuterol (XOPENEX HFA) 45 MCG/ACT inhaler Inhale 2 puffs into the lungs every 8 (eight) hours as needed for wheezing. 1 Inhaler 12  . lidocaine-prilocaine (EMLA) cream Apply 1 application topically 3 (three) times a week. 30 minutes prior to Dialysis - MWF  11  . metoCLOPramide (REGLAN) 5 MG tablet Take 5 mg by mouth 3 (three) times daily as needed (indigestion).    Marland Kitchen oxyCODONE-acetaminophen (PERCOCET/ROXICET) 5-325 MG tablet Take 1 tablet by mouth every 6 (six) hours as needed for moderate pain. 20 tablet 0  . predniSONE (DELTASONE) 5 MG tablet Take 5 mg by mouth daily with breakfast.    . senna-docusate (SENOKOT-S) 8.6-50 MG tablet Take 1 tablet by mouth daily as needed for mild constipation.    . sevelamer carbonate (RENVELA) 800 MG tablet Take 800 mg by mouth 3 (three) times daily with meals.    Tyler Aas FLEXTOUCH 200 UNIT/ML FlexTouch Pen Inject 2 Units into the skin daily as needed (Low blood glucose).    Marland Kitchen warfarin (COUMADIN) 6 MG tablet Take 6-9 mg by mouth See admin instructions. Take 9 mg on Tues, Thurs, and Sun. Take 6 mg on Sun, Mon, Wed, Fri, and Sat      REVIEW OF SYSTEMS:  '[X]'$  denotes positive finding, '[ ]'$  denotes negative finding Cardiac  Comments:  Chest pain or chest pressure:    Shortness of breath upon exertion:    Short of breath when lying flat:    Irregular heart rhythm:        Vascular    Pain in calf, thigh, or hip brought on by ambulation:    Pain in feet at night that wakes you up from your sleep:     Blood clot in your veins:    Leg swelling:  Pulmonary    Oxygen at home:    Productive cough:     Wheezing:         Neurologic    Sudden weakness in arms or legs:     Sudden numbness in arms or legs:     Sudden onset of difficulty speaking or slurred speech:    Temporary loss of vision in one eye:      Problems with dizziness:         Gastrointestinal    Blood in stool:     Vomited blood:         Genitourinary    Burning when urinating:     Blood in urine:        Psychiatric    Major depression:         Hematologic    Bleeding problems:    Problems with blood clotting too easily:        Skin    Rashes or ulcers:        Constitutional    Fever or chills:      PHYSICAL EXAM  Vitals:   10/30/20 1302 10/30/20 1305 10/30/20 1400 10/30/20 1430  BP:  (!) 161/77 136/67 (!) 146/71  Pulse: 87 89 83 83  Resp: '18 18 20 18  '$ Temp:      TempSrc:      SpO2:  100% 100% 100%    Constitutional: well appearing. no distress. Appears well nourished.  Neurologic: no new findings. Expressive aphasia.  Psychiatric: Mood and affect symmetric and appropriate. Eyes: No icterus. No conjunctival pallor. Ears, nose, throat: mucous membranes moist. Midline trachea.  Cardiac: rate and rhythm.  Respiratory: unlabored. Abdominal: soft, non-tender, non-distended.  Peripheral vascular:  No palpable R pedal pulses  Worsening of R foot wound  2+ R popliteal pulse Extremity: No edema. No cyanosis. No pallor.  Skin: No gangrene. No ulceration.  Lymphatic: No Stemmer's sign. No palpable lymphadenopathy.  PERTINENT LABORATORY AND RADIOLOGIC DATA  Most recent CBC CBC Latest Ref Rng & Units 10/30/2020 10/08/2020 09/09/2020  WBC 4.0 - 10.5 K/uL 11.9(H) - -  Hemoglobin 13.0 - 17.0 g/dL 10.3(L) 15.0 15.3  Hematocrit 39.0 - 52.0 % 33.6(L) 44.0 45.0  Platelets 150 - 400 K/uL 255 - -     Most recent CMP CMP Latest Ref Rng & Units 10/30/2020 10/08/2020 09/09/2020  Glucose 70 - 99 mg/dL 191(H) 87 98  BUN 8 - 23 mg/dL 52(H) 37(H) 30(H)  Creatinine 0.61 - 1.24 mg/dL 8.30(H) 5.20(H) 6.80(H)  Sodium 135 - 145 mmol/L 139 133(L) 137  Potassium 3.5 - 5.1 mmol/L 3.8 4.0 4.2  Chloride 98 - 111 mmol/L 97(L) 95(L) 100  CO2 22 - 32 mmol/L 31 - -  Calcium 8.9 - 10.3 mg/dL 9.6 - -  Total Protein 6.5 - 8.1 g/dL 6.6 -  -  Total Bilirubin 0.3 - 1.2 mg/dL 0.6 - -  Alkaline Phos 38 - 126 U/L 49 - -  AST 15 - 41 U/L 12(L) - -  ALT 0 - 44 U/L 10 - -    Renal function Estimated Creatinine Clearance: 7.9 mL/min (A) (by C-G formula based on SCr of 8.3 mg/dL (H)).  Hgb A1c MFr Bld (%)  Date Value  08/05/2020 5.1    LDL Cholesterol  Date Value Ref Range Status  08/07/2020 43 0 - 99 mg/dL Final    Comment:           Total Cholesterol/HDL:CHD Risk Coronary Heart Disease Risk Table  Men   Women  1/2 Average Risk   3.4   3.3  Average Risk       5.0   4.4  2 X Average Risk   9.6   7.1  3 X Average Risk  23.4   11.0        Use the calculated Patient Ratio above and the CHD Risk Table to determine the patient's CHD Risk.        ATP III CLASSIFICATION (LDL):  <100     mg/dL   Optimal  100-129  mg/dL   Near or Above                    Optimal  130-159  mg/dL   Borderline  160-189  mg/dL   High  >190     mg/dL   Very High Performed at [facility] [address], [place], [place] [address]     XR foot 10/30/20 Worsening osteomyelitis of R foot, now including toes 3 and 4. 5th residual metatarsal with osteo. Extreme digital vessel calcification.  [name], MD [facility] Phone Number: [phone] 10/30/2020 3:53 PM

## 2020-10-30 NOTE — Progress Notes (Signed)
ANTICOAGULATION CONSULT NOTE - Initial Consult  Pharmacy Consult for Heparin Indication: afib and h/o CVA  No Known Allergies  Patient Measurements:   Heparin Dosing Weight:    Vital Signs: Temp: 97.4 F (36.3 C) (03/25 2045) Temp Source: Oral (03/25 2045) BP: 155/84 (03/25 2045) Pulse Rate: 81 (03/25 2045)  Labs: Recent Labs    10/30/20 1001 10/30/20 1855  HGB 10.3*  --   HCT 33.6*  --   PLT 255  --   LABPROT  --  48.0*  INR  --  5.4*  CREATININE 8.30*  --     Estimated Creatinine Clearance: 7.9 mL/min (A) (by C-G formula based on SCr of 8.3 mg/dL (H)).   Medical History: Past Medical History:  Diagnosis Date  . Diabetes mellitus without complication ([place])    type 2  . FUO (fever of unknown origin) 09/17/2019  . Hypertension   . Peripheral arterial disease ([place])   . Renal disorder 2015   right kidney transplant  . Stroke [facility])     Assessment: CC/HPI: pain, swelling, warmth and drainage  from foot with progressive right foot gangrene / osteomyelitis   PMH: DM, HTN, PAD, R kidney transplant, ESRD, CVA 08/05/20: R SFA stenting 08/07/20: R iliofemoral endarterectomy and fifth toe ray amputation 09/09/20: R external iliac stenting and popliteal angioplasty 10/08/20: debridement of right fifth toe amputation ulceration  Anticoag: heparin gtt when INR <2 for afib/CVA; warfarin PTA on hold for R BKA 3/27. INR 5.4 tonight. Hgb was 15 on 10/08/20 now down to 10.3. - Home dose 6 mg daily except for 9 mg on Tues, Thurs, Sun.   Goal of Therapy:  Heparin level 0.3-0.7 units/ml Monitor platelets by anticoagulation protocol: Yes   Plan:  Hold warfarin Hold heparin until INR<2 Daily INR  Standley Bargo S. Alford Highland, PharmD, BCPS Clinical Staff Pharmacist [email or web address] Alford Highland, Flora Parks Stillinger 10/30/2020,8:55 PM

## 2020-10-30 NOTE — Plan of Care (Signed)

## 2020-10-30 NOTE — Progress Notes (Signed)
Pharmacy Antibiotic Note  Jonathan Brunick Sr. is a 70 y.o. male admitted on 10/30/2020 with sepsis, R-foot wound.  Pharmacy has been consulted for vancomycin and cefepime dosing.  Hx ESRD-HD usually MWF  Plan: Vancomycin 1500 mg IV x 1, then 750 mg IV qHD Cefepime 2g IV x 1, then 1g IV every 24 hours Monitor HD schedule, Cx/clinical progression and surgery plans Vancomycin levels as needed     Temp (24hrs), Avg:98.7 F (37.1 C), Min:98.6 F (37 C), Max:98.7 F (37.1 C)  Recent Labs  Lab 10/30/20 1001  WBC 11.9*  CREATININE 8.30*    Estimated Creatinine Clearance: 7.9 mL/min (A) (by C-G formula based on SCr of 8.3 mg/dL (H)).    No Known Allergies  Jonathan [name], PharmD Clinical Pharmacist ED Pharmacist Phone # [phone] 10/30/2020 11:40 AM

## 2020-10-30 NOTE — ED Provider Notes (Signed)
[facility] EMERGENCY DEPARTMENT Provider Note   CSN: [id number] Arrival date & time: 10/30/20  [id number]     History Chief Complaint  Patient presents with  . Foot Pain    Jonathan Timpson Sr. is a 70 y.o. male.  HPI   70 year old male with a history of diabetes, hypertension, peripheral arterial disease, right kidney transplant, CVA, ESRD on dialysis (MWF), who presents to the emergency department today with his wife for evaluation of wound to the right foot.  Level 5 caveat as patient has a history of intermittent confusion and wife provides the majority of the history.  She states that she has been caring for the wounds on his right foot and he is supposed to be scheduled for an amputation.  Over the last 2 days she has noticed new swelling to the toes on the right foot with malodorous wounds.  She further notes that she thinks that the patient has had increased pain to this area as he seems uncomfortable when walking however he has not been complaining of pain.  She does not know if the patient has had a fever because she does not have a thermometer.  He is not had any other systemic symptoms at this time but she did note that his blood sugars have been high.  Past Medical History:  Diagnosis Date  . Diabetes mellitus without complication ([place])    type 2  . FUO (fever of unknown origin) 09/17/2019  . Hypertension   . Peripheral arterial disease ([place])   . Renal disorder 2015   right kidney transplant  . Stroke [facility])     Patient Active Problem List   Diagnosis Date Noted  . Osteomyelitis ([place]) 10/30/2020  . Encounter for general adult medical examination without abnormal findings 09/15/2020  . Hemiplegia of dominant side ([place]) 09/15/2020  . History of cerebrovascular accident 09/15/2020  . History of COVID-19 09/15/2020  . Hypercoagulable state ([place]) 09/15/2020  . Other chronic osteomyelitis, right ankle and foot ([place]) 09/15/2020  . Right flank pain 09/15/2020  .  Atherosclerosis of native arteries of the extremities with ulceration ([place]) 08/05/2020  . Encounter for removal of sutures 07/27/2020  . Neuropathic ulcer of foot, unspecified laterality, limited to breakdown of skin ([place]) 06/09/2020  . Anemia 01/16/2020  . Headache, unspecified 12/03/2019  . Other disorders of phosphorus metabolism 10/25/2019  . Unspecified protein-calorie malnutrition ([place]) 09/27/2019  . FUO (fever of unknown origin) 09/17/2019  . Cerebral thrombosis with cerebral infarction 08/02/2019  . Cerebral embolism with cerebral infarction 08/02/2019  . Acute respiratory failure due to COVID-19 ([place]) 07/30/2019  . Acute respiratory failure with hypoxia ([place]) 07/30/2019  . Allergy, unspecified, initial encounter 05/27/2019  . Chronic systolic HF (heart failure) ([place]) 02/25/2019  . ESRD (end stage renal disease) on dialysis ([place]) 02/25/2019  . 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 kidney disease, or end stage renal disease ([place]) 12/04/2018  . Hypoglycemia, unspecified 08/17/2018  . Generalized abdominal pain 07/16/2018  . Hypercalcemia 06/28/2018  . Arteriovenous fistula for hemodialysis in place, primary ([place]) 05/15/2018  . 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 Anemia in chronic kidney disease 05/13/2018  . Idiopathic gout, unspecified site 01/24/2018  . Complication of vascular dialysis catheter 01/16/2018  . Other specified coagulation defects ([place]) 01/16/2018  . Pain, unspecified 01/16/2018  . Pruritus, unspecified 01/16/2018  . Secondary hyperparathyroidism of renal origin ([place]) 01/16/2018  . Shortness of breath 01/16/2018  . Type 2 diabetes  mellitus with diabetic peripheral angiopathy without gangrene ([place]) 01/16/2018  . Abnormal gait 01/10/2018  . Dependence on renal dialysis ([place]) 01/10/2018  . Iron deficiency anemia, unspecified 12/21/2017  . Acute blood loss anemia 12/14/2017  . Hypomagnesemia 12/03/2017  .  Anticoagulated 12/02/2017  . Stroke ([place]) 11/29/2017  . 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 History of MI (myocardial infarction) 11/28/2017  . Hypertension 11/28/2017  . Chronic hepatitis C without hepatic coma ([place]) 09/25/2017  . Diabetes mellitus type 2, uncomplicated ([place]) [id number]  . Myocardial infarction ([place]) 09/25/2017  . Renal transplant recipient 09/25/2017  . Transfusion history 09/25/2017  . Vascular dementia without behavioral disturbance ([place]) 03/10/2017  . History of coronary artery disease 09/12/2016  . 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 MI ([place]) 09/12/2016  . Acute coronary thrombosis not resulting in myocardial infarction ([place]) 09/12/2016  . Aftercare following organ transplant 09/11/2016  . 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 Hyperlipidemia 09/01/2016  . Long term (current) use of anticoagulants 09/01/2016  . 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 At risk for infection transmitted from donor 06/16/2014  . Ureteral stent displacement ([place]) 06/10/2014  . Prophylactic antibiotic 06/09/2014  . Immunosuppression ([place]) 06/06/2014    Past Surgical History:  Procedure Laterality Date  . ABDOMINAL AORTOGRAM N/A 08/05/2020   Procedure: ABDOMINAL AORTOGRAM;  Surgeon: [name], MD;  Location: [place] [facility];  Service: Cardiovascular;  Laterality: N/A;  . ABDOMINAL AORTOGRAM W/LOWER EXTREMITY N/A 09/09/2020   Procedure: ABDOMINAL AORTOGRAM W/LOWER EXTREMITY;  Surgeon: [name], MD;  Location: [place] [facility];  Service: Cardiovascular;  Laterality: N/A;  . AMPUTATION Right 08/06/2020   Procedure: Right Fifth Toe Ray Amputation;  Surgeon: [name], MD;  Location: [place];  Service: Vascular;  Laterality: Right;  . BACK SURGERY     Has had 2 back surgeries  . BUBBLE STUDY  09/18/2019   Procedure: BUBBLE STUDY;  Surgeon: Buford Dresser, MD;  Location: [facility] ENDOSCOPY;   Service: Cardiovascular;;  . Depression    . ENDARTERECTOMY FEMORAL Right 08/06/2020   Procedure: Right Ilio- Femoral Artery Endarterectomy;  Surgeon: [name], MD;  Location: [place];  Service: Vascular;  Laterality: Right;  . HAND SURGERY Right   . HAND SURGERY    . IR REMOVAL TUN CV CATH W/O FL  09/03/2019  . KIDNEY TRANSPLANT     November 2015  . LOWER EXTREMITY ANGIOGRAPHY Bilateral 08/05/2020   Procedure: LOWER EXTREMITY ANGIOGRAPHY;  Surgeon: [name], MD;  Location: [place] [facility];  Service: Cardiovascular;  Laterality: Bilateral;  . Memory loss    . NEPHRECTOMY TRANSPLANTED ORGAN    . PATCH ANGIOPLASTY Right 08/06/2020   Procedure: PATCH ANGIOPLASTY;  Surgeon: [name], MD;  Location: [place];  Service: Vascular;  Laterality: Right;  . PERIPHERAL VASCULAR BALLOON ANGIOPLASTY Right 09/09/2020   Procedure: PERIPHERAL VASCULAR BALLOON ANGIOPLASTY;  Surgeon: [name], MD;  Location: [place] [facility];  Service: Cardiovascular;  Laterality: Right;  popliteal  . PERIPHERAL VASCULAR INTERVENTION Right 08/05/2020   Procedure: PERIPHERAL VASCULAR INTERVENTION;  Surgeon: [name], MD;  Location: [place] [facility];  Service: Cardiovascular;  Laterality: Right;  SFA  . PERIPHERAL VASCULAR INTERVENTION Right 09/09/2020   Procedure: PERIPHERAL VASCULAR INTERVENTION;  Surgeon: [name], MD;  Location: [place] [facility];  Service: Cardiovascular;  Laterality: Right;  external iliac  . TEE WITHOUT CARDIOVERSION N/A 09/18/2019  Procedure: TRANSESOPHAGEAL ECHOCARDIOGRAM (TEE);  Surgeon: Buford Dresser, MD;  Location: [facility] ENDOSCOPY;  Service: Cardiovascular;  Laterality: N/A;  . WOUND DEBRIDEMENT Right 10/08/2020   Procedure: DEBRIDEMENT WOUND RIGHT FOOT;  Surgeon: [name], MD;  Location: [facility] OR;  Service: Vascular;  Laterality: Right;       Family History  Problem Relation Age of Onset  . Diabetes Mellitus II Mother   . Diabetes  Mellitus II Brother     Social History   Tobacco Use  . Smoking status: Former Research scientist (life sciences)  . Smokeless tobacco: Never Used  Vaping Use  . Vaping Use: Never used  Substance Use Topics  . Alcohol use: Never  . Drug use: Never    Home Medications Prior to Admission medications   Medication Sig Start Date End Date Taking? Authorizing Provider  allopurinol (ZYLOPRIM) 100 MG tablet Take 100 mg by mouth daily.  06/09/14   [provider]  aspirin 81 MG chewable tablet Chew 81 mg by mouth daily.  06/09/14   [provider]  atorvastatin (LIPITOR) 40 MG tablet Take 40 mg by mouth daily.    [provider]  b complex-vitamin c-folic acid (NEPHRO-VITE) 0.8 MG TABS tablet Take 1 tablet by mouth daily. 05/08/20   [provider]  clopidogrel (PLAVIX) 75 MG tablet Take 1 tablet (75 mg total) by mouth daily with breakfast. 08/10/20   Dagoberto Ligas, PA-C  feeding supplement (BOOST HIGH PROTEIN) LIQD Take 1 Container by mouth daily as needed (nutrition).    [provider]  insulin aspart protamine- aspart (NOVOLOG MIX 70/30) (70-30) 100 UNIT/ML injection Inject 0-10 Units into the skin daily as needed (high blood sugar).    [provider]  Insulin Pen Needle 32G X 4 MM MISC 1 each by Other route as needed (insulin).  09/10/16   [provider]  levalbuterol Penne Lash HFA) 45 MCG/ACT inhaler Inhale 2 puffs into the lungs every 8 (eight) hours as needed for wheezing. 09/21/19   Raiford Noble Latif, DO  lidocaine-prilocaine (EMLA) cream Apply 1 application topically 3 (three) times a week. 30 minutes prior to Dialysis - MWF 05/08/18   [provider]  metoCLOPramide (REGLAN) 5 MG tablet Take 5 mg by mouth 3 (three) times daily as needed (indigestion).    [provider]  oxyCODONE-acetaminophen (PERCOCET/ROXICET) 5-325 MG tablet Take 1 tablet by mouth every 6 (six) hours as needed for moderate pain. 10/08/20   Ulyses Amor, PA-C   predniSONE (DELTASONE) 5 MG tablet Take 5 mg by mouth daily with breakfast. 03/02/20   [provider]  senna-docusate (SENOKOT-S) 8.6-50 MG tablet Take 1 tablet by mouth daily as needed for mild constipation.    [provider]  sevelamer carbonate (RENVELA) 800 MG tablet Take 800 mg by mouth 3 (three) times daily with meals. 03/31/20   [provider]  TRESIBA FLEXTOUCH 200 UNIT/ML FlexTouch Pen Inject 2 Units into the skin daily as needed (Low blood glucose). 05/01/20   [provider]  warfarin (COUMADIN) 6 MG tablet Take 6-9 mg by mouth See admin instructions. Take 9 mg on Tues, Thurs, and Sun. Take 6 mg on Sun, Mon, Wed, Fri, and Sat    [provider]    Allergies    Patient has no known allergies.  Review of Systems   Review of Systems  Constitutional: Negative for chills and fever.  HENT: Negative for ear pain and sore throat.   Eyes: Negative for visual disturbance.  Respiratory:  Negative for cough and shortness of breath.   Cardiovascular: Negative for chest pain.  Gastrointestinal: Negative for abdominal pain, constipation, diarrhea, nausea and vomiting.  Genitourinary: Negative for flank pain.  Skin: Positive for color change and wound.  Neurological: Negative for headaches.  All other systems reviewed and are negative.   Physical Exam Updated Vital Signs BP (!) 146/71   Pulse 83   Temp 98.7 F (37.1 C) (Rectal)   Resp 18   SpO2 100%   Physical Exam Vitals and nursing note reviewed.  Constitutional:      Appearance: He is well-developed.  HENT:     Head: Normocephalic and atraumatic.  Eyes:     Conjunctiva/sclera: Conjunctivae normal.  Cardiovascular:     Rate and Rhythm: Normal rate and regular rhythm.     Heart sounds: No murmur heard.     Comments: Palpable thrill to lue fistula. No palpable DP pulse on right. Pulmonary:     Effort: Pulmonary effort is normal. No respiratory distress.     Breath sounds: Normal  breath sounds.  Abdominal:     Palpations: Abdomen is soft.     Tenderness: There is no abdominal tenderness.  Musculoskeletal:     Cervical back: Neck supple.     Comments: Swelling noted to the toes and right foot with warmth extending up the mid shin. Chronic ulcerations noted laterally on the foot which appear progressed since his visit to vascular surgery. Wounds are malodorous.  Skin:    General: Skin is warm and dry.  Neurological:     Mental Status: He is alert.     ED Results / Procedures / Treatments   Labs (all labs ordered are listed, but only abnormal results are displayed) Labs Reviewed  COMPREHENSIVE METABOLIC PANEL - Abnormal; Notable for the following components:      Result Value   Chloride 97 (*)    Glucose, Bld 191 (*)    BUN 52 (*)    Creatinine, Ser 8.30 (*)    Albumin 3.1 (*)    AST 12 (*)    GFR, Estimated 7 (*)    All other components within normal limits  CBC WITH DIFFERENTIAL/PLATELET - Abnormal; Notable for the following components:   WBC 11.9 (*)    RBC 3.44 (*)    Hemoglobin 10.3 (*)    HCT 33.6 (*)    RDW 18.9 (*)    Neutro Abs 9.5 (*)    All other components within normal limits  RESP PANEL BY RT-PCR (FLU A&B, COVID) ARPGX2  CULTURE, BLOOD (ROUTINE X 2)  CULTURE, BLOOD (ROUTINE X 2)  LACTIC ACID, PLASMA  LACTIC ACID, PLASMA  HIV ANTIBODY (ROUTINE TESTING W REFLEX)  HEMOGLOBIN A1C    EKG None  Radiology DG Foot Complete Right  Result Date: 10/30/2020 CLINICAL DATA:  Foot infection EXAM: RIGHT FOOT COMPLETE - 3+ VIEW COMPARISON:  05/05/2020 FINDINGS: Interval postsurgical change of transmetatarsal amputation of the fifth ray at the level of the proximal metatarsal metadiaphysis. There is irregularity along the lateral aspect of the residual fifth metatarsal at the resection margin concerning for osteomyelitis. Additional cortical erosion along the lateral cortex of the fifth metatarsal base compatible with osteomyelitis. There are new  marginal erosions involving the lateral aspects of the third and fourth metatarsal heads compatible with acute osteomyelitis. There is fragmentation along the base of the fourth toe proximal phalanx laterally, which could be secondary to infection. Soft tissue irregularity along the lateral aspect of the forefoot which  may reflect ulceration or surgical wound dehiscence. Air within the soft tissues at the level of the residual fifth metatarsal could reflect ulceration, soft tissue gas, or artifact related to overlying bandaging. Severe extensive vascular calcifications. Similar degree of degenerative changes throughout the foot. IMPRESSION: 1. Interval postsurgical change of transmetatarsal amputation of the fifth ray at the level of the proximal fifth metatarsal metadiaphysis. 2. Cortical erosion along the lateral aspect of the residual fifth metatarsal at the resection margin and extending to the fifth metatarsal base compatible with osteomyelitis. 3. New marginal erosions involving the lateral aspects of the third and fourth metatarsal heads compatible with acute osteomyelitis. 4. Air within the soft tissues at the level of the residual fifth metatarsal could reflect ulceration, soft tissue gas, or artifact related to overlying bandaging. Electronically Signed   By: Davina Poke D.O.   On: 10/30/2020 12:23    Procedures Procedures   Medications Ordered in ED Medications  vancomycin (VANCOREADY) IVPB 1500 mg/300 mL (1,500 mg Intravenous New Bag/Given 10/30/20 1430)  ceFEPIme (MAXIPIME) 1 g in sodium chloride 0.9 % 100 mL IVPB (has no administration in time range)  vancomycin (VANCOREADY) IVPB 750 mg/150 mL (has no administration in time range)  allopurinol (ZYLOPRIM) tablet 100 mg (has no administration in time range)  aspirin chewable tablet 81 mg (has no administration in time range)  oxyCODONE-acetaminophen (PERCOCET/ROXICET) 5-325 MG per tablet 1 tablet (has no administration in time range)   atorvastatin (LIPITOR) tablet 40 mg (has no administration in time range)  predniSONE (DELTASONE) tablet 5 mg (has no administration in time range)  metoCLOPramide (REGLAN) tablet 5 mg (has no administration in time range)  senna-docusate (Senokot-S) tablet 1 tablet (has no administration in time range)  sevelamer carbonate (RENVELA) tablet 800 mg (has no administration in time range)  clopidogrel (PLAVIX) tablet 75 mg (has no administration in time range)  multivitamin (RENA-VIT) tablet 1 tablet (has no administration in time range)  feeding supplement (BOOST HIGH PROTEIN) liquid 237 mL (has no administration in time range)  levalbuterol (XOPENEX HFA) inhaler 2 puff (has no administration in time range)  lidocaine-prilocaine (EMLA) cream 1 application (has no administration in time range)  heparin injection 5,000 Units (has no administration in time range)  HYDROmorphone (DILAUDID) injection 0.5-1 mg (has no administration in time range)  insulin aspart (novoLOG) injection 0-6 Units (has no administration in time range)  ceFEPIme (MAXIPIME) 2 g in sodium chloride 0.9 % 100 mL IVPB (0 g Intravenous Stopped 10/30/20 1326)  metroNIDAZOLE (FLAGYL) IVPB 500 mg (0 mg Intravenous Stopped 10/30/20 1428)    ED Course  I have reviewed the triage vital signs and the nursing notes.  Pertinent labs & imaging results that were available during my care of the patient were reviewed by me and considered in my medical decision making (see chart for details).    MDM Rules/Calculators/A&P                          70 y/o male presents to the ED for eval of right foot wound   Reviewed/interpreted labs CBC with mild leukocytosis and mild anemia, anemia appears chronic CMP is grossly at baseline, elevated bun/cr consistent with ESRD Lactic is negative Blood cultures were collected COVID is negative  Xray right foot - 1. Interval postsurgical change of transmetatarsal amputation of the fifth ray at the  level of the proximal fifth metatarsal Metadiaphysis. 2. Cortical erosion along the lateral aspect of the residual  fifth metatarsal at the resection margin and extending to the fifth metatarsal base compatible with osteomyelitis. 3. New marginal erosions involving the lateral aspects of the third and fourth metatarsal heads compatible with acute osteomyelitis. 4. Air within the soft tissues at the level of the residual fifth metatarsal could reflect ulceration, soft tissue gas, or artifact related to overlying bandaging.  Pt was given abx in the ED. Vs have been reassuring and there are no signs of sepsis at this time.   2:08 PM CONSULT with Dr. Luan Pulling with vascular surgery who recommends admission to medicine. He will see the patient in the ED.   2:55 PM CONSULT with Dr. Roosevelt Locks who will admit the patient  Final Clinical Impression(s) / ED Diagnoses Final diagnoses:  Osteomyelitis, unspecified site, unspecified type [facility])    Rx / DC Orders ED Discharge Orders    None       Bishop [place] 10/30/20 1518    Daleen Bo, MD 10/31/20 [phone]

## 2020-10-30 NOTE — ED Notes (Signed)
Lab in to draw blood.

## 2020-10-30 NOTE — ED Notes (Signed)
Patient transported to X-ray 

## 2020-10-30 NOTE — H&P (Signed)
History and Physical    Ezra Boyan [id number] DOB: 1950/12/19 DOA: 10/30/2020  PCP: Ginger Organ., MD (Confirm with patient/family/NH records and if not entered, this has to be entered at [facility] point of entry) Patient coming from: Home  I have personally briefly reviewed patient's old medical records in [place]  Chief Complaint: Right foot infection  HPI: Keiler Noria Sr. is a 70 y.o. male with medical history significant of PVD with chronic right foot nonhealing wound, s/p recent right CFA stenting in 09/2020 IDDM, ESRD on HD MWF, HTN, presented with worsening of the right foot ulcers.  Patient has had nonhealing right foot ulcer, for which he has been following with vascular surgeon, and last month, he underwent right CFA stenting.  However he has noticed worsening of the right foot ulcer, with pain and swelling.  He went to see vascular surgeon yesterday and was told to come in the hospital for firing of worsening infection and may need surgical intervention.  Denies any fever chills. ED Course: Vital signs stable, no hypotension, no fever. WBC 11.9.  Foot x-ray showed suspicious osteomyelitis fifth, fourth and third metatarsal.  Review of Systems: As per HPI otherwise 14 point review of systems negative.    Past Medical History:  Diagnosis Date  . Diabetes mellitus without complication ([place])    type 2  . FUO (fever of unknown origin) 09/17/2019  . Hypertension   . Peripheral arterial disease ([place])   . Renal disorder 2015   right kidney transplant  . Stroke [facility])     Past Surgical History:  Procedure Laterality Date  . ABDOMINAL AORTOGRAM N/A 08/05/2020   Procedure: ABDOMINAL AORTOGRAM;  Surgeon: [name], MD;  Location: [place] CV LAB;  Service: Cardiovascular;  Laterality: N/A;  . ABDOMINAL AORTOGRAM W/LOWER EXTREMITY N/A 09/09/2020   Procedure: ABDOMINAL AORTOGRAM W/LOWER EXTREMITY;  Surgeon: [name], MD;  Location: [place] CV LAB;   Service: Cardiovascular;  Laterality: N/A;  . AMPUTATION Right 08/06/2020   Procedure: Right Fifth Toe Ray Amputation;  Surgeon: [name], MD;  Location: [place];  Service: Vascular;  Laterality: Right;  . BACK SURGERY     Has had 2 back surgeries  . BUBBLE STUDY  09/18/2019   Procedure: BUBBLE STUDY;  Surgeon: Buford Dresser, MD;  Location: [facility] ENDOSCOPY;  Service: Cardiovascular;;  . Depression    . ENDARTERECTOMY FEMORAL Right 08/06/2020   Procedure: Right Ilio- Femoral Artery Endarterectomy;  Surgeon: [name], MD;  Location: [place];  Service: Vascular;  Laterality: Right;  . HAND SURGERY Right   . HAND SURGERY    . IR REMOVAL TUN CV CATH W/O FL  09/03/2019  . KIDNEY TRANSPLANT     November 2015  . LOWER EXTREMITY ANGIOGRAPHY Bilateral 08/05/2020   Procedure: LOWER EXTREMITY ANGIOGRAPHY;  Surgeon: [name], MD;  Location: [place] CV LAB;  Service: Cardiovascular;  Laterality: Bilateral;  . Memory loss    . NEPHRECTOMY TRANSPLANTED ORGAN    . PATCH ANGIOPLASTY Right 08/06/2020   Procedure: PATCH ANGIOPLASTY;  Surgeon: [name], MD;  Location: [place];  Service: Vascular;  Laterality: Right;  . PERIPHERAL VASCULAR BALLOON ANGIOPLASTY Right 09/09/2020   Procedure: PERIPHERAL VASCULAR BALLOON ANGIOPLASTY;  Surgeon: [name], MD;  Location: [place] CV LAB;  Service: Cardiovascular;  Laterality: Right;  popliteal  . PERIPHERAL VASCULAR INTERVENTION Right 08/05/2020   Procedure: PERIPHERAL VASCULAR INTERVENTION;  Surgeon: [name], MD;  Location: [place] CV LAB;  Service: Cardiovascular;  Laterality: Right;  SFA  . PERIPHERAL VASCULAR INTERVENTION Right 09/09/2020   Procedure: PERIPHERAL VASCULAR INTERVENTION;  Surgeon: [name], MD;  Location: [place] CV LAB;  Service: Cardiovascular;  Laterality: Right;  external iliac  . TEE WITHOUT CARDIOVERSION N/A 09/18/2019   Procedure: TRANSESOPHAGEAL ECHOCARDIOGRAM (TEE);  Surgeon:  Buford Dresser, MD;  Location: [facility]COPY;  Service: Cardiovascular;  Laterality: N/A;  . WOUND DEBRIDEMENT Right 10/08/2020   Procedure: DEBRIDEMENT WOUND RIGHT FOOT;  Surgeon: [name], MD;  Location: [place];  Service: Vascular;  Laterality: Right;     reports that he has quit smoking. He has never used smokeless tobacco. He reports that he does not drink alcohol and does not use drugs.  No Known Allergies  Family History  Problem Relation Age of Onset  . Diabetes Mellitus II Mother   . Diabetes Mellitus II Brother      Prior to Admission medications   Medication Sig Start Date End Date Taking? Authorizing Provider  allopurinol (ZYLOPRIM) 100 MG tablet Take 100 mg by mouth daily.  06/09/14   [provider]  aspirin 81 MG chewable tablet Chew 81 mg by mouth daily.  06/09/14   [provider]  atorvastatin (LIPITOR) 40 MG tablet Take 40 mg by mouth daily.    [provider]  b complex-vitamin c-folic acid (NEPHRO-VITE) 0.8 MG TABS tablet Take 1 tablet by mouth daily. 05/08/20   [provider]  clopidogrel (PLAVIX) 75 MG tablet Take 1 tablet (75 mg total) by mouth daily with breakfast. 08/10/20   Dagoberto Ligas, PA-C  feeding supplement (BOOST HIGH PROTEIN) LIQD Take 1 Container by mouth daily as needed (nutrition).    [provider]  insulin aspart protamine- aspart (NOVOLOG MIX 70/30) (70-30) 100 UNIT/ML injection Inject 0-10 Units into the skin daily as needed (high blood sugar).    [provider]  Insulin Pen Needle 32G X 4 MM MISC 1 each by Other route as needed (insulin).  09/10/16   [provider]  levalbuterol Penne Lash HFA) 45 MCG/ACT inhaler Inhale 2 puffs into the lungs every 8 (eight) hours as needed for wheezing. 09/21/19   Raiford Noble Latif, DO  lidocaine-prilocaine (EMLA) cream Apply 1 application topically 3 (three) times a week. 30 minutes prior to Dialysis - MWF 05/08/18   [provider]   metoCLOPramide (REGLAN) 5 MG tablet Take 5 mg by mouth 3 (three) times daily as needed (indigestion).    [provider]  oxyCODONE-acetaminophen (PERCOCET/ROXICET) 5-325 MG tablet Take 1 tablet by mouth every 6 (six) hours as needed for moderate pain. 10/08/20   Ulyses Amor, PA-C  predniSONE (DELTASONE) 5 MG tablet Take 5 mg by mouth daily with breakfast. 03/02/20   [provider]  senna-docusate (SENOKOT-S) 8.6-50 MG tablet Take 1 tablet by mouth daily as needed for mild constipation.    [provider]  sevelamer carbonate (RENVELA) 800 MG tablet Take 800 mg by mouth 3 (three) times daily with meals. 03/31/20   [provider]  TRESIBA FLEXTOUCH 200 UNIT/ML FlexTouch Pen Inject 2 Units into the skin daily as needed (Low blood glucose). 05/01/20   [provider]  warfarin (COUMADIN) 6 MG tablet Take 6-9 mg by mouth See admin instructions. Take 9 mg on Tues, Thurs, and Sun. Take 6 mg on Sun, Mon, Wed, Fri, and Sat    [provider]    Physical Exam: Vitals:  10/30/20 1302 10/30/20 1305 10/30/20 1400 10/30/20 1430  BP:  (!) 161/77 136/67 (!) 146/71  Pulse: 87 89 83 83  Resp: '18 18 20 18  '$ Temp:      TempSrc:      SpO2:  100% 100% 100%    Constitutional: NAD, calm, comfortable Vitals:   10/30/20 1302 10/30/20 1305 10/30/20 1400 10/30/20 1430  BP:  (!) 161/77 136/67 (!) 146/71  Pulse: 87 89 83 83  Resp: '18 18 20 18  '$ Temp:      TempSrc:      SpO2:  100% 100% 100%   Eyes: PERRL, lids and conjunctivae normal ENMT: Mucous membranes are moist. Posterior pharynx clear of any exudate or lesions.Normal dentition.  Neck: normal, supple, no masses, no thyromegaly Respiratory: clear to auscultation bilaterally, no wheezing, no crackles. Normal respiratory effort. No accessory muscle use.  Cardiovascular: Regular rate and rhythm, no murmurs / rubs / gallops. No extremity edema. 2+ pedal pulses. No carotid bruits.  Abdomen: no tenderness,  no masses palpated. No hepatosplenomegaly. Bowel sounds positive.  Musculoskeletal: no clubbing / cyanosis. No joint deformity upper and lower extremities. Good ROM, no contractures. Normal muscle tone.  Skin: Large ulcer on lateral side of the right foot with purulent surface Neurologic: CN 2-12 grossly intact. Sensation intact, DTR normal. Strength 5/5 in all 4.  Psychiatric: Normal judgment and insight. Alert and oriented x 3. Normal mood.       Labs on Admission: I have personally reviewed following labs and imaging studies  CBC: Recent Labs  Lab 10/30/20 1001  WBC 11.9*  NEUTROABS 9.5*  HGB 10.3*  HCT 33.6*  MCV 97.7  PLT 123456   Basic Metabolic Panel: Recent Labs  Lab 10/30/20 1001  NA 139  K 3.8  CL 97*  CO2 31  GLUCOSE 191*  BUN 52*  CREATININE 8.30*  CALCIUM 9.6   GFR: Estimated Creatinine Clearance: 7.9 mL/min (A) (by C-G formula based on SCr of 8.3 mg/dL (H)). Liver Function Tests: Recent Labs  Lab 10/30/20 1001  AST 12*  ALT 10  ALKPHOS 49  BILITOT 0.6  PROT 6.6  ALBUMIN 3.1*   No results for input(s): LIPASE, AMYLASE in the last 168 hours. No results for input(s): AMMONIA in the last 168 hours. Coagulation Profile: No results for input(s): INR, PROTIME in the last 168 hours. Cardiac Enzymes: No results for input(s): CKTOTAL, CKMB, CKMBINDEX, TROPONINI in the last 168 hours. BNP (last 3 results) No results for input(s): PROBNP in the last 8760 hours. HbA1C: No results for input(s): HGBA1C in the last 72 hours. CBG: No results for input(s): GLUCAP in the last 168 hours. Lipid Profile: No results for input(s): CHOL, HDL, LDLCALC, TRIG, CHOLHDL, LDLDIRECT in the last 72 hours. Thyroid Function Tests: No results for input(s): TSH, T4TOTAL, FREET4, T3FREE, THYROIDAB in the last 72 hours. Anemia Panel: No results for input(s): VITAMINB12, FOLATE, FERRITIN, TIBC, IRON, RETICCTPCT in the last 72 hours. Urine analysis:    Component Value Date/Time    COLORURINE AMBER (A) 09/11/2019 0534   APPEARANCEUR TURBID (A) 09/11/2019 0534   LABSPEC 1.012 09/11/2019 0534   PHURINE 8.0 09/11/2019 0534   GLUCOSEU NEGATIVE 09/11/2019 0534   HGBUR LARGE (A) 09/11/2019 0534   BILIRUBINUR NEGATIVE 09/11/2019 [place] 09/11/2019 0534   PROTEINUR 100 (A) 09/11/2019 0534   NITRITE NEGATIVE 09/11/2019 0534   LEUKOCYTESUR LARGE (A) 09/11/2019 0534    Radiological Exams on Admission: DG Foot Complete Right  Result Date:  10/30/2020 CLINICAL DATA:  Foot infection EXAM: RIGHT FOOT COMPLETE - 3+ VIEW COMPARISON:  05/05/2020 FINDINGS: Interval postsurgical change of transmetatarsal amputation of the fifth ray at the level of the proximal metatarsal metadiaphysis. There is irregularity along the lateral aspect of the residual fifth metatarsal at the resection margin concerning for osteomyelitis. Additional cortical erosion along the lateral cortex of the fifth metatarsal base compatible with osteomyelitis. There are new marginal erosions involving the lateral aspects of the third and fourth metatarsal heads compatible with acute osteomyelitis. There is fragmentation along the base of the fourth toe proximal phalanx laterally, which could be secondary to infection. Soft tissue irregularity along the lateral aspect of the forefoot which may reflect ulceration or surgical wound dehiscence. Air within the soft tissues at the level of the residual fifth metatarsal could reflect ulceration, soft tissue gas, or artifact related to overlying bandaging. Severe extensive vascular calcifications. Similar degree of degenerative changes throughout the foot. IMPRESSION: 1. Interval postsurgical change of transmetatarsal amputation of the fifth ray at the level of the proximal fifth metatarsal metadiaphysis. 2. Cortical erosion along the lateral aspect of the residual fifth metatarsal at the resection margin and extending to the fifth metatarsal base compatible with  osteomyelitis. 3. New marginal erosions involving the lateral aspects of the third and fourth metatarsal heads compatible with acute osteomyelitis. 4. Air within the soft tissues at the level of the residual fifth metatarsal could reflect ulceration, soft tissue gas, or artifact related to overlying bandaging. Electronically Signed   By: Davina Poke D.O.   On: 10/30/2020 12:23    EKG: Independently reviewed. Sinus rhythm, no ST-T issue.  Assessment/Plan Active Problems:   Osteomyelitis ([place])  (please populate well all problems here in Problem List. (For example, if patient is on BP meds at home and you resume or decide to hold them, it is a problem that needs to be her. Same for CAD, COPD, HLD and so on)  Right foot osteomyelitis -Along with severe PVD and despite recent recent vascularization of CLV standing, ulcer and infection not controlled.  Agreed with surgical plan of BKA. -No signs of sepsis, continue vancomycin and cefepime.  PAF -High risk of CVA given past Hx. -Switch to Heparin drip, consult pharmacy.  PVD with recent stenting -Plan to continue Plavix given stenting was just one month old.  IDDM -Sliding scale for now  ESRD on HD -Consult nephro for routine HD.  HTN -Fairly controlled.  DVT prophylaxis: Heparin drip Code Status: Full Code Family Communication: Wife at bedside Disposition Plan: Expect more than 2 midnight hospital stay to treat acute osmolarities and surgical intervention, expect discharge to rehab vs home PT. Consults called: Vascular surgery Admission status: MedSurg admit   Lequita Halt MD [facility] Pager [phone]  10/30/2020, 3:23 PM

## 2020-10-30 NOTE — ED Triage Notes (Signed)
Patient states he is here to have some part of his right foot amputated. No appointments or notes in Epic indicated scheduled procedures. Patient alert, oriented, and in no apparent distress at this time.

## 2020-10-30 NOTE — Consult Note (Incomplete)
[facility] MWF 4h15 400/500 63 kg 2K/2Ca AVF Heparin 3000 Left 3.6kg over EDW on Wed    Rob Schertz, MD 10/30/2020, 4:57 PM  MWF [facility]   4h 74mn  400/500  63kg  2/2 bath  AVF  Hep 3000  - left 3.6 kg over on Wed

## 2020-10-30 NOTE — Progress Notes (Signed)
ANTICOAGULATION CONSULT NOTE - Initial Consult  Pharmacy Consult for heparin when INR<2 Indication: atrial fibrillation  No Known Allergies  Patient Measurements:   Heparin Dosing Weight: 64.9 kg  Vital Signs: Temp: 97.4 F (36.3 C) (03/25 2045) Temp Source: Oral (03/25 2045) BP: 155/84 (03/25 2045) Pulse Rate: 81 (03/25 2045)  Labs: Recent Labs    10/30/20 1001 10/30/20 1855  HGB 10.3*  --   HCT 33.6*  --   PLT 255  --   LABPROT  --  48.0*  INR  --  5.4*  CREATININE 8.30*  --     Estimated Creatinine Clearance: 7.9 mL/min (A) (by C-G formula based on SCr of 8.3 mg/dL (H)).   Medical History: Past Medical History:  Diagnosis Date  . Diabetes mellitus without complication ([place])    type 2  . FUO (fever of unknown origin) 09/17/2019  . Hypertension   . Peripheral arterial disease ([place])   . Renal disorder 2015   right kidney transplant  . Stroke [facility])     Assessment: 19 YOM with progressive right foot gangrene/osteomyelitis admitted for right BKA. Pharmacy has been consulted to dose heparin.  Patient is on warfarin PTA for afib with history of CVA. Home dose 6 mg daily except for 9 mg on Tues, Thurs, Sun.   INR 5.4 tonight so will continue to follow and start heparin when down trending closer to or below 2. Hgb 10.3, plt wnl. No bleeding issues noted. BKA planned for 11/01/20.   Goal of Therapy:  Heparin level 0.3-0.7 units/ml Monitor platelets by anticoagulation protocol: Yes   Plan:  Start heparin when INR <2 Daily INR for now  Please check [email or web address] for unit specific pharmacy phone numbers.

## 2020-10-30 NOTE — Progress Notes (Signed)
[facility] notified patient had frank red blood in his stool. New orders received will continue to monitor, Arthor Captain LPN

## 2020-10-30 NOTE — ED Provider Notes (Signed)
  Face-to-face evaluation   History: Patient states he is here to have his foot amputated.  He states he saw Dr. Wilburn Mylar and was told that he needed to have some surgery.  He is a somewhat poor historian.  He did have dialysis today.  He denies pain in his right foot or right leg.  He denies nausea, vomiting, shortness of breath or chest pain.  Physical exam: Alert, calm, cooperative.  Right foot is bandaged, there is a small amount of blood on the bandage.  No respiratory distress.  The patient is calm.  Medical screening examination/treatment/procedure(s) were conducted as a shared visit with non-physician practitioner(s) and myself.  I personally evaluated the patient during the encounter    Daleen Bo, MD 10/31/20 [phone]

## 2020-10-30 NOTE — ED Notes (Signed)
IV team in with pt 

## 2020-10-31 LAB — GLUCOSE, CAPILLARY
Glucose-Capillary: 71 mg/dL (ref 70–99)
Glucose-Capillary: 66 mg/dL — ABNORMAL LOW (ref 70–99)
Glucose-Capillary: 99 mg/dL (ref 70–99)
Glucose-Capillary: 164 mg/dL — ABNORMAL HIGH (ref 70–99)
Glucose-Capillary: 121 mg/dL — ABNORMAL HIGH (ref 70–99)

## 2020-10-31 LAB — BASIC METABOLIC PANEL
Anion gap: 15 (ref 5–15)
BUN: 61 mg/dL — ABNORMAL HIGH (ref 8–23)
CO2: 27 mmol/L (ref 22–32)
Calcium: 9.8 mg/dL (ref 8.9–10.3)
Chloride: 96 mmol/L — ABNORMAL LOW (ref 98–111)
Creatinine, Ser: 8.74 mg/dL — ABNORMAL HIGH (ref 0.61–1.24)
GFR, Estimated: 6 mL/min — ABNORMAL LOW (ref >60)
Glucose, Bld: 95 mg/dL (ref 70–99)
Potassium: 4.6 mmol/L (ref 3.5–5.1)
Sodium: 138 mmol/L (ref 135–145)

## 2020-10-31 LAB — PROTIME-INR
INR: 6.1 (ref 0.8–1.2)
INR: 2.1 — ABNORMAL HIGH (ref 0.8–1.2)
Prothrombin Time: 52.5 seconds — ABNORMAL HIGH (ref 11.4–15.2)
Prothrombin Time: 22.8 seconds — ABNORMAL HIGH (ref 11.4–15.2)

## 2020-10-31 LAB — CBC
HCT: 33.5 % — ABNORMAL LOW (ref 39.0–52.0)
HCT: 36.3 % — ABNORMAL LOW (ref 39.0–52.0)
Hemoglobin: 10.6 g/dL — ABNORMAL LOW (ref 13.0–17.0)
Hemoglobin: 11.4 g/dL — ABNORMAL LOW (ref 13.0–17.0)
MCH: 29.4 pg (ref 26.0–34.0)
MCH: 29.5 pg (ref 26.0–34.0)
MCHC: 31.6 g/dL (ref 30.0–36.0)
MCHC: 31.4 g/dL (ref 30.0–36.0)
MCV: 93.1 fL (ref 80.0–100.0)
MCV: 94 fL (ref 80.0–100.0)
Platelets: 261 10*3/uL (ref 150–400)
Platelets: 256 10*3/uL (ref 150–400)
RBC: 3.6 MIL/uL — ABNORMAL LOW (ref 4.22–5.81)
RBC: 3.86 MIL/uL — ABNORMAL LOW (ref 4.22–5.81)
RDW: 18.6 % — ABNORMAL HIGH (ref 11.5–15.5)
RDW: 18.6 % — ABNORMAL HIGH (ref 11.5–15.5)
WBC: 11.8 10*3/uL — ABNORMAL HIGH (ref 4.0–10.5)
WBC: 10.2 10*3/uL (ref 4.0–10.5)
nRBC: 0 % (ref 0.0–0.2)
nRBC: 0 % (ref 0.0–0.2)

## 2020-10-31 MED ORDER — HEPARIN SODIUM (PORCINE) 1000 UNIT/ML IJ SOLN
INTRAMUSCULAR | Status: AC
  Filled 2020-10-31: qty 3

## 2020-10-31 NOTE — Progress Notes (Signed)
[facility] paged that INR 6.1 critical value. Arthor Captain LPN

## 2020-10-31 NOTE — Progress Notes (Signed)
[facility] [place] PROGRESS NOTE  ASSESSMENT / PLAN: Jonathan Cranston Sr. is a 70 y.o. male with non-salvageable R foot ischemia. Prolonged bleeding from LUE AVF which resolved with compression. Plan R BKA Monday. Ordered 2u FFP for INR.  SUBJECTIVE: No complaints. Plan for OR reviewed with patient.  OBJECTIVE: BP (!) 146/79 (BP Location: Left Arm)   Pulse 81   Temp 97.7 F (36.5 C) (Oral)   Resp 16   Wt 61.9 kg   SpO2 99%   BMI 18.51 kg/m   Intake/Output Summary (Last 24 hours) at 10/31/2020 1234 Last data filed at 10/31/2020 1124 Gross per 24 hour  Intake 500 ml  Output 1501 ml  Net -1001 ml    No change to R foot Hemostatic LUE AVF  CBC Latest Ref Rng & Units 10/31/2020 10/30/2020 10/08/2020  WBC 4.0 - 10.5 K/uL 11.8(H) 11.9(H) -  Hemoglobin 13.0 - 17.0 g/dL 10.6(L) 10.3(L) 15.0  Hematocrit 39.0 - 52.0 % 33.5(L) 33.6(L) 44.0  Platelets 150 - 400 K/uL 261 255 -     CMP Latest Ref Rng & Units 10/31/2020 10/30/2020 10/08/2020  Glucose 70 - 99 mg/dL 95 191(H) 87  BUN 8 - 23 mg/dL 61(H) 52(H) 37(H)  Creatinine 0.61 - 1.24 mg/dL 8.74(H) 8.30(H) 5.20(H)  Sodium 135 - 145 mmol/L 138 139 133(L)  Potassium 3.5 - 5.1 mmol/L 4.6 3.8 4.0  Chloride 98 - 111 mmol/L 96(L) 97(L) 95(L)  CO2 22 - 32 mmol/L 27 31 -  Calcium 8.9 - 10.3 mg/dL 9.8 9.6 -  Total Protein 6.5 - 8.1 g/dL - 6.6 -  Total Bilirubin 0.3 - 1.2 mg/dL - 0.6 -  Alkaline Phos 38 - 126 U/L - 49 -  AST 15 - 41 U/L - 12(L) -  ALT 0 - 44 U/L - 10 -    Estimated Creatinine Clearance: 7.2 mL/min (A) (by C-G formula based on SCr of 8.74 mg/dL (H)).  Jonathan Aline. Stanford Breed, MD [facility] Phone Number: [phone] 10/31/2020 12:34 PM

## 2020-10-31 NOTE — Plan of Care (Signed)

## 2020-10-31 NOTE — Procedures (Signed)
   I was present at this dialysis session, have reviewed the session itself and made  appropriate changes Kelly Splinter MD [place] pager [phone]   10/31/2020, 1:11 PM

## 2020-10-31 NOTE — Consult Note (Signed)
[facility] Renal Consultation Note    Indication for Consultation:  Management of ESRD/hemodialysis; anemia, hypertension/volume and secondary hyperparathyroidism   HPI: Cordarrel Regalbuto Sr. is a 70 y.o. male with ESRD on HD (Hx failed KTx), DMT2, HTN, gout, prior CVA, PVD s/p R SFA  stenting and right toe amp. Now admitted with nonhealing right foot wound. Foot xray suggesting osteomyelitis. Vanc/cefepime started. VVS consulted - plan for BKA.  Labs notable for INR 6.1.   Dialysis MWF at [facility]. Dialysis via R AVF. Usually compliant with treatments but sometimes cuts time. Last dialysis was Wednesday. He left 3.6kg over his dry weight.   Seen and examined in HD unit. Tolerating UF. Has some foot pain. Denies f, c, cp, sob, n/v, d.    Past Medical History:  Diagnosis Date  . Diabetes mellitus without complication ([place])    type 2  . FUO (fever of unknown origin) 09/17/2019  . Hypertension   . Peripheral arterial disease ([place])   . Renal disorder 2015   right kidney transplant  . Stroke [facility])    Past Surgical History:  Procedure Laterality Date  . ABDOMINAL AORTOGRAM N/A 08/05/2020   Procedure: ABDOMINAL AORTOGRAM;  Surgeon: [name], MD;  Location: [place] CV LAB;  Service: Cardiovascular;  Laterality: N/A;  . ABDOMINAL AORTOGRAM W/LOWER EXTREMITY N/A 09/09/2020   Procedure: ABDOMINAL AORTOGRAM W/LOWER EXTREMITY;  Surgeon: [name], MD;  Location: [place] CV LAB;  Service: Cardiovascular;  Laterality: N/A;  . AMPUTATION Right 08/06/2020   Procedure: Right Fifth Toe Ray Amputation;  Surgeon: [name], MD;  Location: [place];  Service: Vascular;  Laterality: Right;  . BACK SURGERY     Has had 2 back surgeries  . BUBBLE STUDY  09/18/2019   Procedure: BUBBLE STUDY;  Surgeon: Buford Dresser, MD;  Location: [facility] ENDOSCOPY;  Service: Cardiovascular;;  . Depression    . ENDARTERECTOMY FEMORAL Right 08/06/2020   Procedure: Right  Ilio- Femoral Artery Endarterectomy;  Surgeon: [name], MD;  Location: [place];  Service: Vascular;  Laterality: Right;  . HAND SURGERY Right   . HAND SURGERY    . IR REMOVAL TUN CV CATH W/O FL  09/03/2019  . KIDNEY TRANSPLANT     November 2015  . LOWER EXTREMITY ANGIOGRAPHY Bilateral 08/05/2020   Procedure: LOWER EXTREMITY ANGIOGRAPHY;  Surgeon: [name], MD;  Location: [place] CV LAB;  Service: Cardiovascular;  Laterality: Bilateral;  . Memory loss    . NEPHRECTOMY TRANSPLANTED ORGAN    . PATCH ANGIOPLASTY Right 08/06/2020   Procedure: PATCH ANGIOPLASTY;  Surgeon: [name], MD;  Location: [place];  Service: Vascular;  Laterality: Right;  . PERIPHERAL VASCULAR BALLOON ANGIOPLASTY Right 09/09/2020   Procedure: PERIPHERAL VASCULAR BALLOON ANGIOPLASTY;  Surgeon: [name], MD;  Location: [place] CV LAB;  Service: Cardiovascular;  Laterality: Right;  popliteal  . PERIPHERAL VASCULAR INTERVENTION Right 08/05/2020   Procedure: PERIPHERAL VASCULAR INTERVENTION;  Surgeon: [name], MD;  Location: [place] CV LAB;  Service: Cardiovascular;  Laterality: Right;  SFA  . PERIPHERAL VASCULAR INTERVENTION Right 09/09/2020   Procedure: PERIPHERAL VASCULAR INTERVENTION;  Surgeon: [name], MD;  Location: [place] CV LAB;  Service: Cardiovascular;  Laterality: Right;  external iliac  . TEE WITHOUT CARDIOVERSION N/A 09/18/2019   Procedure: TRANSESOPHAGEAL ECHOCARDIOGRAM (TEE);  Surgeon: Buford Dresser, MD;  Location: [place];  Service: Cardiovascular;  Laterality: N/A;  . WOUND DEBRIDEMENT Right 10/08/2020  Procedure: DEBRIDEMENT WOUND RIGHT FOOT;  Surgeon: [name], MD;  Location: [facility] OR;  Service: Vascular;  Laterality: Right;   Family History  Problem Relation Age of Onset  . Diabetes Mellitus II Mother   . Diabetes Mellitus II Brother    Social History:  reports that he has quit smoking. He has never used smokeless tobacco. He  reports that he does not drink alcohol and does not use drugs. No Known Allergies Prior to Admission medications   Medication Sig Start Date End Date Taking? Authorizing Provider  allopurinol (ZYLOPRIM) 100 MG tablet Take 100 mg by mouth at bedtime. 06/09/14  Yes [provider]  aspirin 81 MG chewable tablet Chew 81 mg by mouth daily.  06/09/14  Yes [provider]  atorvastatin (LIPITOR) 40 MG tablet Take 40 mg by mouth daily.   Yes [provider]  b complex-vitamin c-folic acid (NEPHRO-VITE) 0.8 MG TABS tablet Take 1 tablet by mouth daily. 05/08/20  Yes [provider]  clopidogrel (PLAVIX) 75 MG tablet Take 1 tablet (75 mg total) by mouth daily with breakfast. 08/10/20  Yes Dagoberto Ligas, PA-C  feeding supplement (BOOST HIGH PROTEIN) LIQD Take 1 Container by mouth daily as needed (nutrition).   Yes [provider]  insulin aspart protamine- aspart (NOVOLOG MIX 70/30) (70-30) 100 UNIT/ML injection Inject 0-10 Units into the skin daily as needed (high blood sugar).   Yes [provider]  Insulin Pen Needle 32G X 4 MM MISC 1 each by Other route as needed (insulin).  09/10/16  Yes [provider]  levalbuterol (XOPENEX HFA) 45 MCG/ACT inhaler Inhale 2 puffs into the lungs every 8 (eight) hours as needed for wheezing. 09/21/19  Yes Sheikh, Omair Latif, DO  lidocaine-prilocaine (EMLA) cream Apply 1 application topically 3 (three) times a week. 30 minutes prior to Dialysis - MWF 05/08/18  Yes [provider]  oxyCODONE-acetaminophen (PERCOCET/ROXICET) 5-325 MG tablet Take 1 tablet by mouth every 6 (six) hours as needed for moderate pain. 10/08/20  Yes Laurence Slate M, PA-C  predniSONE (DELTASONE) 5 MG tablet Take 5 mg by mouth daily with breakfast. 03/02/20  Yes [provider]  senna-docusate (SENOKOT-S) 8.6-50 MG tablet Take 1 tablet by mouth daily as needed for mild constipation.   Yes [provider]  sevelamer  carbonate (RENVELA) 800 MG tablet Take 800 mg by mouth 3 (three) times daily with meals. 03/31/20  Yes [provider]  TRESIBA FLEXTOUCH 200 UNIT/ML FlexTouch Pen Inject 2 Units into the skin daily as needed (Low blood glucose). 05/01/20  Yes [provider]  warfarin (COUMADIN) 6 MG tablet Take 6-9 mg by mouth See admin instructions. Take 9 mg on Tues, Thurs, and Sun. Take 6 mg on Sun, Mon, Wed, Fri, and Sat   Yes [provider]   Current Facility-Administered Medications  Medication Dose Route Frequency Provider Last Rate Last Admin  . allopurinol (ZYLOPRIM) tablet 100 mg  100 mg Oral Daily Wynetta Fines T, MD      . atorvastatin (LIPITOR) tablet 40 mg  40 mg Oral Daily Wynetta Fines T, MD      . ceFEPIme (MAXIPIME) 1 g in sodium chloride 0.9 % 100 mL IVPB  1 g Intravenous Q24H Bertis Ruddy, RPH      . Chlorhexidine Gluconate Cloth 2 % PADS 6 each  6 each Topical Q0600 Roney Jaffe, MD   6 each at 10/31/20 [phone]  . feeding supplement (BOOST HIGH PROTEIN) liquid 237 mL  1 Container Oral Daily PRN Wynetta Fines T, MD      . heparin sodium (porcine) 1000 UNIT/ML injection           . HYDROmorphone (DILAUDID) injection 0.5-1 mg  0.5-1 mg Intravenous Q2H PRN Wynetta Fines T, MD      . insulin aspart (novoLOG) injection 0-6 Units  0-6 Units Subcutaneous TID WC Wynetta Fines T, MD      . levalbuterol Penne Lash) nebulizer solution 0.63 mg  0.63 mg Inhalation Q8H PRN Wynetta Fines T, MD      . lidocaine-prilocaine (EMLA) cream 1 application  1 application Topical Once per day on Mon Wed Fri Zhang, Ping T, MD      . multivitamin (RENA-VIT) tablet 1 tablet  1 tablet Oral QHS Wynetta Fines T, MD      . oxyCODONE-acetaminophen (PERCOCET/ROXICET) 5-325 MG per tablet 1 tablet  1 tablet Oral Q6H PRN Lequita Halt, MD   1 tablet at 10/30/20 2200  . predniSONE (DELTASONE) tablet 5 mg  5 mg Oral Q breakfast Wynetta Fines T, MD      . senna-docusate (Senokot-S) tablet 1 tablet  1 tablet Oral Daily  PRN Wynetta Fines T, MD      . sevelamer carbonate (RENVELA) tablet 800 mg  800 mg Oral TID WC Wynetta Fines T, MD   800 mg at 10/30/20 1756  . [START ON 11/02/2020] vancomycin (VANCOREADY) IVPB 750 mg/150 mL  750 mg Intravenous Q M,W,F-HD Bertis Ruddy, RPH         ROS: As per HPI otherwise negative.  Physical Exam: Vitals:   10/31/20 0830 10/31/20 0900 10/31/20 0930 10/31/20 1000  BP: 121/64 139/69 128/67 121/63  Pulse: 80 99 77 78  Resp: '16 16 16 16  '$ Temp:      TempSrc:      SpO2: 99%     Weight:         General: WDWN NAD Head: NCAT sclera not icteric MMM Neck: Supple. No JVD  Lungs: CTA bilaterally without wheezes, rales, or rhonchi. Breathing is unlabored. Heart: RRR with S1 S2 Abdomen: soft NT + BS Lower extremities: No sig LE edema; R foot bandaged.  Neuro: A & O  X 3. Moves all extremities spontaneously. Psych:  Responds to questions appropriately with a normal affect. Dialysis Access: RUE AVF   Labs: Basic Metabolic Panel: Recent Labs  Lab 10/30/20 1001 10/31/20 0258  NA 139 138  K 3.8 4.6  CL 97* 96*  CO2 31 27  GLUCOSE 191* 95  BUN 52* 61*  CREATININE 8.30* 8.74*  CALCIUM 9.6 9.8   Liver Function Tests: Recent Labs  Lab 10/30/20 1001  AST 12*  ALT 10  ALKPHOS 49  BILITOT 0.6  PROT 6.6  ALBUMIN 3.1*   No results for input(s): LIPASE, AMYLASE in the last 168 hours. No results for input(s): AMMONIA in the last 168 hours. CBC: Recent Labs  Lab 10/30/20 1001 10/31/20 0258  WBC 11.9* 11.8*  NEUTROABS 9.5*  --   HGB 10.3* 10.6*  HCT 33.6* 33.5*  MCV 97.7 93.1  PLT 255 261   Cardiac Enzymes: No results for input(s): CKTOTAL, CKMB, CKMBINDEX, TROPONINI in the last 168 hours. CBG: Recent Labs  Lab 10/30/20 1753 10/30/20 2047 10/31/20 0618  GLUCAP 117* 178* 71   Iron Studies: No results for input(s): IRON, TIBC, TRANSFERRIN, FERRITIN in the last 72 hours. Studies/Results: DG Foot Complete Right  Result Date: 10/30/2020 CLINICAL DATA:   Foot infection EXAM: RIGHT  FOOT COMPLETE - 3+ VIEW COMPARISON:  05/05/2020 FINDINGS: Interval postsurgical change of transmetatarsal amputation of the fifth ray at the level of the proximal metatarsal metadiaphysis. There is irregularity along the lateral aspect of the residual fifth metatarsal at the resection margin concerning for osteomyelitis. Additional cortical erosion along the lateral cortex of the fifth metatarsal base compatible with osteomyelitis. There are new marginal erosions involving the lateral aspects of the third and fourth metatarsal heads compatible with acute osteomyelitis. There is fragmentation along the base of the fourth toe proximal phalanx laterally, which could be secondary to infection. Soft tissue irregularity along the lateral aspect of the forefoot which may reflect ulceration or surgical wound dehiscence. Air within the soft tissues at the level of the residual fifth metatarsal could reflect ulceration, soft tissue gas, or artifact related to overlying bandaging. Severe extensive vascular calcifications. Similar degree of degenerative changes throughout the foot. IMPRESSION: 1. Interval postsurgical change of transmetatarsal amputation of the fifth ray at the level of the proximal fifth metatarsal metadiaphysis. 2. Cortical erosion along the lateral aspect of the residual fifth metatarsal at the resection margin and extending to the fifth metatarsal base compatible with osteomyelitis. 3. New marginal erosions involving the lateral aspects of the third and fourth metatarsal heads compatible with acute osteomyelitis. 4. Air within the soft tissues at the level of the residual fifth metatarsal could reflect ulceration, soft tissue gas, or artifact related to overlying bandaging. Electronically Signed   By: Davina Poke D.O.   On: 10/30/2020 12:23    Dialysis Orders:  [facility] MWF 4h 25mn  400/500  63kg  2/2 bath  AVF  Hep 3000 Mircera 75 q 2 wks (last 3/16)  Calcitriol 1.75 tiw    Assessment/Plan: 1. R foot osteo --Empiric antibiotics per primary. VVS following - for BKA Monday 3/28   2. PVD -- s/p R SFA stenting, RCFA endarterectomy. On Plavix.  3. ESRD -  HD MWF. Missed HD yesterday. HD off schedule today.  4. PAFib -on warfarn. Supratherapeutic INR on admission. Per pharmacy  5. Hypertension/volume  - BP ok Trace edema. UF to dry weight as tolerated.  6.  Anemia  - Hgb at goal. On ESA as outpatient.  7.  Metabolic bone disease -  Continue binders. Follow Ca/Phos trends.   OLynnda ChildPA-C [facility] 10/31/2020, 11:23 AM

## 2020-10-31 NOTE — Progress Notes (Signed)
Jonathan [name].  [id number] DOB: 1951-04-19 DOA: 10/30/2020 PCP: Ginger Organ., MD    Brief Narrative:  70 year old with a history of peripheral vascular disease s/p stenting Dec 2021 and Feb 2022, chronic nonhealing right foot wound, DM 2, ESRD on HD MWF, and HTN who presented to the ER with worsening of his right foot ulcer.  He was being followed closely by his vascular surgery team, was evaluated as an outpatient on the day of his admission, and advised to present to the ER as his wound was clearly worsening.  Antimicrobials:  Cefepime 3/25 > Vancomycin 3/25 > Flagyl 3/25 >  DVT prophylaxis: IV heparin  Consultants:  Vascular Surgery Nephrology  Code Status: FULL CODE  Subjective: Patient reportedly experienced an episode of frank blood in the stool after admission.  He was found to have an INR of 6.1.  Afebrile.  Vital signs stable.  I have visited the patient while he was on dialysis in the unit.  He has no complaints at this time.  He denies chest pain shortness of breath fevers chills nausea or vomiting.  Assessment & Plan:  Right diabetic foot infection with osteomyelitis Vascular surgery knows the patient and plans to proceed with BKA on Monday  Peripheral vascular disease  08/05/20 R SFA stenting 08/07/20 R iliofemoral endarterectomy and fifth toe ray amputation 09/09/20 R external iliac stentingand popliteal angioplasty 10/08/20 debridement of right fifth toe amputation ulceration Vascular surgery attending to this issue  Paroxysmal atrial fibrillation Transition to heparin drip during perioperative period  Coumadin induced coagulopathy Had what appears to be a self-limited episode of GI bleeding as well as bleeding from his dialysis access -vascular surgery has written to transfuse FFP -follow INR and of course hold heparin until INR below therapeutic level  Anemia of chronic kidney disease as well as acute blood loss Follow hemoglobin in serial fashion  with coagulopathy induced blood loss -of note patient has had a subsequent bowel movement with no apparent bleeding  Recent Labs  Lab 10/30/20 1001 10/31/20 0258  HGB 10.3* 10.6*    Uncontrolled DM2 with peripheral vascular disease A1c 5.7 -CBG presently well controlled  ESRD on HD MWF Nephrology following  HTN Marginal low blood pressure at present -hold blood pressure agents and follow trend   Family Communication:  Status is: Inpatient  Remains inpatient appropriate because:Inpatient level of care appropriate due to severity of illness   Dispo: The patient is from: Home              Anticipated d/c is to: unclear              Patient currently is not medically stable to d/c.   Difficult to place patient No    Objective: Blood pressure 128/67, pulse 77, temperature 97.7 F (36.5 C), temperature source Oral, resp. rate 16, weight 63.4 kg, SpO2 99 %.  Intake/Output Summary (Last 24 hours) at 10/31/2020 0948 Last data filed at 10/30/2020 2200 Gross per 24 hour  Intake 500 ml  Output 1 ml  Net 499 ml   Filed Weights   10/31/20 0708  Weight: 63.4 kg    Examination: General: No acute respiratory distress Lungs: Clear to auscultation bilaterally without wheezes or crackles Cardiovascular: Regular rate without murmur gallop or rub normal S1 and S2 Abdomen: Nontender, nondistended, soft, bowel sounds positive, no rebound, no ascites, no appreciable mass Extremities: No significant edema bilateral lower extremities  CBC: Recent Labs  Lab 10/30/20 1001 10/31/20 0258  WBC 11.9* 11.8*  NEUTROABS 9.5*  --   HGB 10.3* 10.6*  HCT 33.6* 33.5*  MCV 97.7 93.1  PLT 255 0000000   Basic Metabolic Panel: Recent Labs  Lab 10/30/20 1001 10/31/20 0258  NA 139 138  K 3.8 4.6  CL 97* 96*  CO2 31 27  GLUCOSE 191* 95  BUN 52* 61*  CREATININE 8.30* 8.74*  CALCIUM 9.6 9.8   GFR: Estimated Creatinine Clearance: 7.4 mL/min (A) (by C-G formula based on SCr of 8.74 mg/dL  (H)).  Liver Function Tests: Recent Labs  Lab 10/30/20 1001  AST 12*  ALT 10  ALKPHOS 49  BILITOT 0.6  PROT 6.6  ALBUMIN 3.1*    Coagulation Profile: Recent Labs  Lab 10/30/20 1855 10/31/20 0258  INR 5.4* 6.1*     HbA1C: Hgb A1c MFr Bld  Date/Time Value Ref Range Status  10/30/2020 06:57 PM 5.7 (H) 4.8 - 5.6 % Final  08/05/2020 08:39 PM 5.1 4.8 - 5.6 % Final    Comment:    (NOTE) Pre diabetes:          5.7%-6.4%  Diabetes:              >6.4%  Glycemic control for   <7.0% adults with diabetes     CBG: Recent Labs  Lab 10/30/20 1753 10/30/20 2047 10/31/20 0618  GLUCAP 117* 178* 71    Recent Results (from the past 240 hour(s))  Resp Panel by RT-PCR (Flu A&B, Covid) Nasopharyngeal Swab     Status: None   Collection Time: 10/30/20 11:28 AM   Specimen: Nasopharyngeal Swab; Nasopharyngeal(NP) swabs in vial transport medium  Result Value Ref Range Status   SARS Coronavirus 2 by RT PCR NEGATIVE NEGATIVE Final    Comment: (NOTE) SARS-CoV-2 target nucleic acids are NOT DETECTED.  The SARS-CoV-2 RNA is generally detectable in upper respiratory specimens during the acute phase of infection. The lowest concentration of SARS-CoV-2 viral copies this assay can detect is 138 copies/mL. A negative result does not preclude SARS-Cov-2 infection and should not be used as the sole basis for treatment or other patient management decisions. A negative result may occur with  improper specimen collection/handling, submission of specimen other than nasopharyngeal swab, presence of viral mutation(s) within the areas targeted by this assay, and inadequate number of viral copies(<138 copies/mL). A negative result must be combined with clinical observations, patient history, and epidemiological information. The expected result is Negative.  Fact Sheet for Patients:  EntrepreneurPulse.com.au  Fact Sheet for Healthcare Providers:   IncredibleEmployment.be  This test is no t yet approved or cleared by the [place] FDA and  has been authorized for detection and/or diagnosis of SARS-CoV-2 by FDA under an Emergency Use Authorization (EUA). This EUA will remain  in effect (meaning this test can be used) for the duration of the COVID-19 declaration under Section 564(b)(1) of the Act, 21 U.S.C.section 360bbb-3(b)(1), unless the authorization is terminated  or revoked sooner.       Influenza A by PCR NEGATIVE NEGATIVE Final   Influenza B by PCR NEGATIVE NEGATIVE Final    Comment: (NOTE) The Xpert Xpress SARS-CoV-2/FLU/RSV plus assay is intended as an aid in the diagnosis of influenza from Nasopharyngeal swab specimens and should not be used as a sole basis for treatment. Nasal washings and aspirates are unacceptable for Xpert Xpress SARS-CoV-2/FLU/RSV testing.  Fact Sheet for Patients: EntrepreneurPulse.com.au  Fact Sheet for Healthcare Providers: IncredibleEmployment.be  This test is not yet approved or cleared by the  [place] Architectural technologist and has been authorized for detection and/or diagnosis of SARS-CoV-2 by FDA under an Print production planner (EUA). This EUA will remain in effect (meaning this test can be used) for the duration of the COVID-19 declaration under Section 564(b)(1) of the Act, 21 U.S.C. section 360bbb-3(b)(1), unless the authorization is terminated or revoked.  Performed at [facility], [place] [address] [place], [place] [address]      Scheduled Meds: . allopurinol  100 mg Oral Daily  . atorvastatin  40 mg Oral Daily  . Chlorhexidine Gluconate Cloth  6 each Topical Q0600  . heparin sodium (porcine)      . insulin aspart  0-6 Units Subcutaneous TID WC  . lidocaine-prilocaine  1 application Topical Once per day on Mon Wed Fri  . multivitamin  1 tablet Oral QHS  . predniSONE  5 mg Oral Q breakfast  . sevelamer carbonate   800 mg Oral TID WC   Continuous Infusions: . ceFEPime (MAXIPIME) IV    . [START ON 11/02/2020] vancomycin       LOS: 1 day   Cherene Altes, MD [facility] Office  [phone] Pager - Text Page per Amion  If 7PM-7AM, please contact night-coverage per Amion 10/31/2020, 9:48 AM

## 2020-10-31 NOTE — Progress Notes (Signed)
Pt came back from HD to the unit. CBG 66, 4 oz of OJ is given, BP 95/72, HR 92, O2 100, RR 16. Pt wnet to the BRP, no blood in stool noted. Telemetry monitoring is on. Call bell within reach.

## 2020-11-01 ENCOUNTER — Encounter (HOSPITAL_COMMUNITY): Payer: Self-pay | Admitting: Internal Medicine

## 2020-11-01 ENCOUNTER — Other Ambulatory Visit: Payer: Self-pay

## 2020-11-01 LAB — CBC
HCT: 29.3 % — ABNORMAL LOW (ref 39.0–52.0)
HCT: 30 % — ABNORMAL LOW (ref 39.0–52.0)
Hemoglobin: 9.5 g/dL — ABNORMAL LOW (ref 13.0–17.0)
Hemoglobin: 9.3 g/dL — ABNORMAL LOW (ref 13.0–17.0)
MCH: 30 pg (ref 26.0–34.0)
MCH: 29.4 pg (ref 26.0–34.0)
MCHC: 32.4 g/dL (ref 30.0–36.0)
MCHC: 31 g/dL (ref 30.0–36.0)
MCV: 92.4 fL (ref 80.0–100.0)
MCV: 94.9 fL (ref 80.0–100.0)
Platelets: 247 10*3/uL (ref 150–400)
Platelets: 228 10*3/uL (ref 150–400)
RBC: 3.17 MIL/uL — ABNORMAL LOW (ref 4.22–5.81)
RBC: 3.16 MIL/uL — ABNORMAL LOW (ref 4.22–5.81)
RDW: 18.5 % — ABNORMAL HIGH (ref 11.5–15.5)
RDW: 18.4 % — ABNORMAL HIGH (ref 11.5–15.5)
WBC: 10 10*3/uL (ref 4.0–10.5)
WBC: 8.1 10*3/uL (ref 4.0–10.5)
nRBC: 0 % (ref 0.0–0.2)
nRBC: 0 % (ref 0.0–0.2)

## 2020-11-01 LAB — RENAL FUNCTION PANEL
Albumin: 2.8 g/dL — ABNORMAL LOW (ref 3.5–5.0)
Anion gap: 9 (ref 5–15)
BUN: 32 mg/dL — ABNORMAL HIGH (ref 8–23)
CO2: 30 mmol/L (ref 22–32)
Calcium: 9.2 mg/dL (ref 8.9–10.3)
Chloride: 97 mmol/L — ABNORMAL LOW (ref 98–111)
Creatinine, Ser: 5.51 mg/dL — ABNORMAL HIGH (ref 0.61–1.24)
GFR, Estimated: 11 mL/min — ABNORMAL LOW (ref >60)
Glucose, Bld: 171 mg/dL — ABNORMAL HIGH (ref 70–99)
Phosphorus: 3.7 mg/dL (ref 2.5–4.6)
Potassium: 5 mmol/L (ref 3.5–5.1)
Sodium: 136 mmol/L (ref 135–145)

## 2020-11-01 LAB — SURGICAL PCR SCREEN
MRSA, PCR: NEGATIVE
Staphylococcus aureus: NEGATIVE

## 2020-11-01 LAB — PROTIME-INR
INR: 2.4 — ABNORMAL HIGH (ref 0.8–1.2)
INR: 2 — ABNORMAL HIGH (ref 0.8–1.2)
Prothrombin Time: 25.2 seconds — ABNORMAL HIGH (ref 11.4–15.2)
Prothrombin Time: 22 seconds — ABNORMAL HIGH (ref 11.4–15.2)

## 2020-11-01 LAB — PREPARE FRESH FROZEN PLASMA: Unit division: 0

## 2020-11-01 LAB — GLUCOSE, CAPILLARY
Glucose-Capillary: 150 mg/dL — ABNORMAL HIGH (ref 70–99)
Glucose-Capillary: 136 mg/dL — ABNORMAL HIGH (ref 70–99)
Glucose-Capillary: 148 mg/dL — ABNORMAL HIGH (ref 70–99)
Glucose-Capillary: 172 mg/dL — ABNORMAL HIGH (ref 70–99)

## 2020-11-01 LAB — BPAM FFP
Blood Product Expiration Date: 202203292359
Blood Product Expiration Date: 202203302359
ISSUE DATE / TIME: 202203261406
ISSUE DATE / TIME: 202203261632
Unit Type and Rh: 5100
Unit Type and Rh: 6200

## 2020-11-01 MED ORDER — VANCOMYCIN HCL 750 MG/150ML IV SOLN
750.0000 mg | Freq: Once | INTRAVENOUS | Status: AC
  Administered 2020-11-01: 750 mg via INTRAVENOUS
  Filled 2020-11-01: qty 150

## 2020-11-01 MED ORDER — MUPIROCIN 2 % EX OINT: 1.0000 "application " | TOPICAL_OINTMENT | Freq: Two times a day (BID) | CUTANEOUS | Status: DC

## 2020-11-01 MED ORDER — VITAMIN K1 10 MG/ML IJ SOLN
5.0000 mg | Freq: Once | INTRAVENOUS | Status: AC
  Administered 2020-11-01: 5 mg via INTRAVENOUS
  Filled 2020-11-01: qty 0.5

## 2020-11-01 NOTE — Plan of Care (Signed)

## 2020-11-01 NOTE — Progress Notes (Addendum)
  Progress Note    11/01/2020 7:25 AM * No surgery date entered *  Jonathan Savoy Sr. is a 70 y.o. male with non-salvageable R foot ischemia. 2 Units FFP ordered yesterday for INR of 2.4. Will need to follow repeat labs later this morning. Plan is for right BKA tomorrow 11/02/20 if INR improved. NPO after midnight. Morning labs ordered. Consent ordered  Karoline Caldwell, PA-C [facility] [phone] 11/01/2020 7:25 AM   VASCULAR STAFF ADDENDUM: I have independently interviewed and examined the patient. I agree with the above.  OR tomorrow with me for R BKA if we can get INR corrected. 2u FFP ordered again today. NPO after midnight.  Jonathan Aline. Stanford Breed, MD [facility] Phone Number: [phone] 11/01/2020 11:29 AM

## 2020-11-01 NOTE — Progress Notes (Signed)
Jonathan Carlon Sr.  [id number] DOB: Apr 01, 1951 DOA: 10/30/2020 PCP: Ginger Organ., MD    Brief Narrative:  [phone] with a hx of PAD s/p stenting Dec 2021 and Feb 2022, chronic nonhealing R foot wound, DM2, ESRD on HD MWF, and HTN who presented to the ER with worsening of his R foot ulcer.  He was being followed closely by his vascular surgery team, was evaluated as an outpatient on the day of his admission, and advised to present to the ER as his wound was clearly worsening.  Antimicrobials:  Cefepime 3/25 > Vancomycin 3/25 > Flagyl 3/25 >  DVT prophylaxis: IV heparin  Consultants:  Vascular Surgery Nephrology  Code Status: FULL CODE  Subjective: Afebrile.  Vital signs stable.  Enjoying a meal at the time of my visit.  Denies any complaints at all.  States he is prepared for surgery.  Assessment & Plan:  Right diabetic foot infection with osteomyelitis Vascular surgery knows the patient and plans to proceed with BKA on Monday  Peripheral arterial disease  08/05/20 R SFA stenting 08/07/20 R iliofemoral endarterectomy and fifth toe ray amputation 09/09/20 R external iliac stentingand popliteal angioplasty 10/08/20 debridement of right fifth toe amputation ulceration Vascular surgery attending to this issue as noted above  Paroxysmal atrial fibrillation Transition to heparin drip during perioperative period once INR below therapeutic range  Coumadin induced coagulopathy Had a self-limited episode of GI bleeding as well as bleeding from his dialysis access during this admission - s/p 2U FFP - follow INR and of course hold heparin until INR below therapeutic level - recheck INR later today to determine if further FFP needed to prepare for surgery tomorrow   Anemia of chronic kidney disease as well as acute blood loss Follow hemoglobin in serial fashion with coagulopathy induced blood loss - of note patient has had a subsequent bowel movement with no apparent bleeding  Recent  Labs  Lab 10/30/20 1001 10/31/20 0258 10/31/20 2038 11/01/20 0105  HGB 10.3* 10.6* 11.4* 9.5*    Uncontrolled DM2 with peripheral vascular disease A1c 5.7 -CBG well controlled  ESRD on HD MWF Nephrology following  HTN Blood pressure variable -follow without change for now   Family Communication:  Status is: Inpatient  Remains inpatient appropriate because:Inpatient level of care appropriate due to severity of illness   Dispo: The patient is from: Home              Anticipated d/c is to: unclear              Patient currently is not medically stable to d/c.   Difficult to place patient No    Objective: Blood pressure (!) 155/80, pulse 86, temperature 97.7 F (36.5 C), temperature source Oral, resp. rate 16, weight 61.9 kg, SpO2 100 %.  Intake/Output Summary (Last 24 hours) at 11/01/2020 0912 Last data filed at 10/31/2020 2021 Gross per 24 hour  Intake 872 ml  Output 1500 ml  Net -628 ml   Filed Weights   10/31/20 0708 10/31/20 1124  Weight: 63.4 kg 61.9 kg    Examination: General: No acute respiratory distress Lungs: Clear to auscultation bilaterally -no wheezing Cardiovascular: Regular rate without murmur gallop or rub normal S1 and S2 Abdomen: Nontender, nondistended, soft, bowel sounds positive, no rebound, no ascites, no appreciable mass Extremities: No significant edema bilateral lower extremities  CBC: Recent Labs  Lab 10/30/20 1001 10/31/20 0258 10/31/20 2038 11/01/20 0105  WBC 11.9* 11.8* 10.2 10.0  NEUTROABS 9.5*  --   --   --  HGB 10.3* 10.6* 11.4* 9.5*  HCT 33.6* 33.5* 36.3* 29.3*  MCV 97.7 93.1 94.0 92.4  PLT 255 261 256 [id number]   Basic Metabolic Panel: Recent Labs  Lab 10/30/20 1001 10/31/20 0258 11/01/20 0105  NA 139 138 136  K 3.8 4.6 5.0  CL 97* 96* 97*  CO2 '31 27 30  '$ GLUCOSE 191* 95 171*  BUN 52* 61* 32*  CREATININE 8.30* 8.74* 5.51*  CALCIUM 9.6 9.8 9.2  PHOS  --   --  3.7   GFR: Estimated Creatinine Clearance: 11.4  mL/min (A) (by C-G formula based on SCr of 5.51 mg/dL (H)).  Liver Function Tests: Recent Labs  Lab 10/30/20 1001 11/01/20 0105  AST 12*  --   ALT 10  --   ALKPHOS 49  --   BILITOT 0.6  --   PROT 6.6  --   ALBUMIN 3.1* 2.8*    Coagulation Profile: Recent Labs  Lab 10/30/20 1855 10/31/20 0258 10/31/20 2038 11/01/20 0105  INR 5.4* 6.1* 2.1* 2.4*     HbA1C: Hgb A1c MFr Bld  Date/Time Value Ref Range Status  10/30/2020 06:57 PM 5.7 (H) 4.8 - 5.6 % Final  08/05/2020 08:39 PM 5.1 4.8 - 5.6 % Final    Comment:    (NOTE) Pre diabetes:          5.7%-6.4%  Diabetes:              >6.4%  Glycemic control for   <7.0% adults with diabetes     CBG: Recent Labs  Lab 10/31/20 1252 10/31/20 1332 10/31/20 1553 10/31/20 2016 11/01/20 0634  GLUCAP 66* 99 164* 121* 150*    Recent Results (from the past 240 hour(s))  Blood culture (routine x 2)     Status: None (Preliminary result)   Collection Time: 10/30/20 11:18 AM   Specimen: BLOOD  Result Value Ref Range Status   Specimen Description BLOOD SITE NOT SPECIFIED  Final   Special Requests   Final    BOTTLES DRAWN AEROBIC ONLY Blood Culture results may not be optimal due to an inadequate volume of blood received in culture bottles   Culture   Final    NO GROWTH 1 DAY Performed at [facility], [place] [address] [place], [place] [address]    Report Status PENDING  Incomplete  Resp Panel by RT-PCR (Flu A&B, Covid) Nasopharyngeal Swab     Status: None   Collection Time: 10/30/20 11:28 AM   Specimen: Nasopharyngeal Swab; Nasopharyngeal(NP) swabs in vial transport medium  Result Value Ref Range Status   SARS Coronavirus 2 by RT PCR NEGATIVE NEGATIVE Final    Comment: (NOTE) SARS-CoV-2 target nucleic acids are NOT DETECTED.  The SARS-CoV-2 RNA is generally detectable in upper respiratory specimens during the acute phase of infection. The lowest concentration of SARS-CoV-2 viral copies this assay can detect is 138  copies/mL. A negative result does not preclude SARS-Cov-2 infection and should not be used as the sole basis for treatment or other patient management decisions. A negative result may occur with  improper specimen collection/handling, submission of specimen other than nasopharyngeal swab, presence of viral mutation(s) within the areas targeted by this assay, and inadequate number of viral copies(<138 copies/mL). A negative result must be combined with clinical observations, patient history, and epidemiological information. The expected result is Negative.  Fact Sheet for Patients:  EntrepreneurPulse.com.au  Fact Sheet for Healthcare Providers:  IncredibleEmployment.be  This test is no t yet approved or cleared by  the [employer] and  has been authorized for detection and/or diagnosis of SARS-CoV-2 by FDA under an Emergency Use Authorization (EUA). This EUA will remain  in effect (meaning this test can be used) for the duration of the COVID-19 declaration under Section 564(b)(1) of the Act, 21 U.S.C.section 360bbb-3(b)(1), unless the authorization is terminated  or revoked sooner.       Influenza A by PCR NEGATIVE NEGATIVE Final   Influenza B by PCR NEGATIVE NEGATIVE Final    Comment: (NOTE) The Xpert Xpress SARS-CoV-2/FLU/RSV plus assay is intended as an aid in the diagnosis of influenza from Nasopharyngeal swab specimens and should not be used as a sole basis for treatment. Nasal washings and aspirates are unacceptable for Xpert Xpress SARS-CoV-2/FLU/RSV testing.  Fact Sheet for Patients: EntrepreneurPulse.com.au  Fact Sheet for Healthcare Providers: IncredibleEmployment.be  This test is not yet approved or cleared by the [place] FDA and has been authorized for detection and/or diagnosis of SARS-CoV-2 by FDA under an Emergency Use Authorization (EUA). This EUA will remain in effect (meaning  this test can be used) for the duration of the COVID-19 declaration under Section 564(b)(1) of the Act, 21 U.S.C. section 360bbb-3(b)(1), unless the authorization is terminated or revoked.  Performed at [facility], [place] [address] [place], [place] [address]   Blood culture (routine x 2)     Status: None (Preliminary result)   Collection Time: 10/30/20  1:07 PM   Specimen: BLOOD  Result Value Ref Range Status   Specimen Description BLOOD SITE NOT SPECIFIED  Final   Special Requests   Final    BOTTLES DRAWN AEROBIC ONLY Blood Culture results may not be optimal due to an inadequate volume of blood received in culture bottles   Culture   Final    NO GROWTH 1 DAY Performed at [facility], [place] [address], [place], [place] [address]    Report Status PENDING  Incomplete     Scheduled Meds:  allopurinol  100 mg Oral Daily   atorvastatin  40 mg Oral Daily   Chlorhexidine Gluconate Cloth  6 each Topical Q0600   insulin aspart  0-6 Units Subcutaneous TID WC   lidocaine-prilocaine  1 application Topical Once per day on Mon Wed Fri   multivitamin  1 tablet Oral QHS   predniSONE  5 mg Oral Q breakfast   sevelamer carbonate  800 mg Oral TID WC   Continuous Infusions:  ceFEPime (MAXIPIME) IV 1 g (10/31/20 1838)   [START ON 11/02/2020] vancomycin     vancomycin 750 mg (11/01/20 0837)     LOS: 2 days   Cherene Altes, MD [facility] Office  [phone] Pager - Text Page per Shea Evans  If 7PM-7AM, please contact night-coverage per Amion 11/01/2020, 9:12 AM

## 2020-11-01 NOTE — Progress Notes (Signed)
  [facility] Progress Note   Subjective:  Seen in room. No new complaints. No cp, sob Completed dialysis yesterday net UF 1.5L    Objective Vitals:   10/31/20 1830 10/31/20 2219 11/01/20 0635 11/01/20 0638  BP: (!) 159/97 (!) 141/67 (!) 155/80 (!) 155/80  Pulse: 90 87 88 86  Resp: '18 16 16 16  '$ Temp: 97.7 F (36.5 C) 97.7 F (36.5 C) 97.7 F (36.5 C) 97.7 F (36.5 C)  TempSrc: Oral Oral Oral Oral  SpO2: 100% 100% 100% 100%  Weight:         Additional Objective Labs: Basic Metabolic Panel: Recent Labs  Lab 10/30/20 1001 10/31/20 0258 11/01/20 0105  NA 139 138 136  K 3.8 4.6 5.0  CL 97* 96* 97*  CO2 '31 27 30  '$ GLUCOSE 191* 95 171*  BUN 52* 61* 32*  CREATININE 8.30* 8.74* 5.51*  CALCIUM 9.6 9.8 9.2  PHOS  --   --  3.7   CBC: Recent Labs  Lab 10/30/20 1001 10/31/20 0258 10/31/20 2038 11/01/20 0105  WBC 11.9* 11.8* 10.2 10.0  NEUTROABS 9.5*  --   --   --   HGB 10.3* 10.6* 11.4* 9.5*  HCT 33.6* 33.5* 36.3* 29.3*  MCV 97.7 93.1 94.0 92.4  PLT 255 261 256 247   Blood Culture    Component Value Date/Time   SDES BLOOD SITE NOT SPECIFIED 10/30/2020 1307   SPECREQUEST  10/30/2020 1307    BOTTLES DRAWN AEROBIC ONLY Blood Culture results may not be optimal due to an inadequate volume of blood received in culture bottles   CULT  10/30/2020 1307    NO GROWTH 1 DAY Performed at [facility], [place] [address] [place], [place] [address]    REPTSTATUS PENDING 10/30/2020 1307     Physical Exam General: Lying in bed, nad  Heart: RRR  Lungs: Clear, no wheeze, no rales  Abdomen: soft non-tender Extremities: No sig LE edema: R foot wound bandages  Dialysis Access: RUE AVF +bruit   Medications: . ceFEPime (MAXIPIME) IV 1 g (10/31/20 1838)  . [START ON 11/02/2020] vancomycin     . allopurinol  100 mg Oral Daily  . atorvastatin  40 mg Oral Daily  . Chlorhexidine Gluconate Cloth  6 each Topical Q0600  . insulin aspart  0-6 Units Subcutaneous  TID WC  . lidocaine-prilocaine  1 application Topical Once per day on Mon Wed Fri  . multivitamin  1 tablet Oral QHS  . predniSONE  5 mg Oral Q breakfast  . sevelamer carbonate  800 mg Oral TID WC    Dialysis Orders:  [facility] MWF 4h 46mn 400/500 63kg 2/2 bath AVF Hep 3000 Mircera 75 q 2 wks (last 3/16)  Calcitriol 1.75 tiw   Assessment/Plan: 1. R foot osteo --Empiric antibiotics per primary. VVS following - for BKA Monday 3/28   2. PVD -- s/p R SFA stenting, RCFA endarterectomy. On Plavix.  3. ESRD -  HD MWF.  K + 5.0 Next HD 3/28  4. PAFib -on [place]. Supratherapeutic INR on admission. Per pharmacy  5. Hypertension/volume  - BP ok Trace edema. UF as tolerated  6. Anemia  - Hgb 11.4> 9.5.  On ESA as outpatient.  7. Metabolic bone disease -  Corr Ca elevated/Phos ok. Continue binders. Hold calcitriol.    OLynnda ChildPA-C [facility] 11/01/2020,11:01 AM

## 2020-11-01 NOTE — Anesthesia Preprocedure Evaluation (Addendum)
Anesthesia Evaluation  Patient identified by MRN, date of birth, ID band Patient awake    Reviewed: Allergy & Precautions, NPO status , Patient's Chart, lab work & pertinent test results  Airway Mallampati: II  TM Distance: >3 FB Neck ROM: Full    Dental  (+) Poor Dentition, Missing   Pulmonary shortness of breath, former smoker,    Pulmonary exam normal        Cardiovascular hypertension, Pt. on medications + CAD, + Past MI and + Peripheral Vascular Disease   Rhythm:Regular Rate:Normal     Neuro/Psych  Headaches, PSYCHIATRIC DISORDERS Dementia CVA    GI/Hepatic negative GI ROS, (+) Hepatitis -  Endo/Other  diabetes  Renal/GU ESRF and DialysisRenal disease     Musculoskeletal Osteomyelitis right leg   Abdominal (+)  Abdomen: soft. Bowel sounds: normal.  Peds  Hematology  (+) anemia ,   Anesthesia Other Findings   Reproductive/Obstetrics                           Anesthesia Physical  Anesthesia Plan  ASA: III  Anesthesia Plan: General and Regional   Post-op Pain Management: GA combined w/ Regional for post-op pain   Induction: Intravenous  PONV Risk Score and Plan: 2 and Ondansetron, Dexamethasone and Midazolam  Airway Management Planned: LMA and Mask  Additional Equipment: None  Intra-op Plan:   Post-operative Plan: Extubation in OR  Informed Consent: I have reviewed the patients History and Physical, chart, labs and discussed the procedure including the risks, benefits and alternatives for the proposed anesthesia with the patient or authorized representative who has indicated his/her understanding and acceptance.     Dental advisory given  Plan Discussed with: CRNA  Anesthesia Plan Comments: (Lab Results      Component                Value               Date                      WBC                      8.1                 11/01/2020                HGB                       9.3 (L)             11/01/2020                HCT                      30.0 (L)            11/01/2020                MCV                      94.9                11/01/2020                PLT                      228  11/01/2020           Lab Results      Component                Value               Date                      NA                       136                 11/01/2020                K                        5.0                 11/01/2020                CO2                      30                  11/01/2020                GLUCOSE                  171 (H)             11/01/2020                BUN                      32 (H)              11/01/2020                CREATININE               5.51 (H)            11/01/2020                CALCIUM                  9.2                 11/01/2020                GFRNONAA                 11 (L)              11/01/2020                GFRAA                    13 (L)              09/22/2019                 S/p recent right SFA stenting by Dr. Stanford Breed on 08/05/2020 and the following day underwent right iliofemoral endarterectomy and fifth toe ray amputation. His is on coumadin and Plavix.  History of chronic systolic CHF (depressed EF since at least 2005 per review of records in Care everywhere, echo 03/2004 sowed EF 45%). History of remote MI at age 71. Most recent stress test  08/04/16 showed severe fixed defects consistent with prior infarction, negative for inducible ischemia. Last seen by cardiology 09/18/19 during admission for CVA. TEE 09/18/19 showed EF 30-35%, no significant valvular abnormalities.   ESRD on HD MWF (Hx failed kidney transplant 2015 - 2019).   TEE 09/18/19: 1. Left ventricular ejection fraction, by estimation, is 30 to 35%. The  left ventricle has severely decreased function. The left ventrical  demonstrates regional wall motion abnormalities (see scoring  diagram/findings for description). There is   moderately increased left ventricular hypertrophy. Left ventricular  diastolic function could not be evaluated. There is severe akinesis of the  left ventricular, entire apical segment.  2. Right ventricular systolic function is normal. The right ventricular  size is normal.  3. No left atrial/left atrial appendage thrombus was detected.  4. The mitral valve is normal in structure and function. Mild mitral  valve regurgitation. No evidence of mitral stenosis.  5. The aortic valve is tricuspid. Aortic valve regurgitation is trivial .  No aortic stenosis is present.  6. There is mild (Grade II) plaque involving the descending aorta.  7. Agitated saline contrast bubble study was negative, with no evidence  of any interatrial shunt.   Conclusion(s)/Recomendation(s): Normal biventricular function without  evidence of hemodynamically significant valvular heart disease. No  evidence of vegetation/infective endocarditis on this transesophageal  echocardiogram. No LA/LAA thrombus identified. Negative bubble study for  interatrial shunt. No intracardiac source of embolism detected on this on  this transesophageal echocardiogram.  )     Anesthesia Quick Evaluation

## 2020-11-02 ENCOUNTER — Inpatient Hospital Stay (HOSPITAL_COMMUNITY): Payer: Medicare Other | Admitting: Anesthesiology

## 2020-11-02 ENCOUNTER — Encounter (HOSPITAL_COMMUNITY)
Admission: EM | Disposition: A | Discharge status: Skilled Nursing Facility | Payer: Self-pay | Source: Home / Self Care | Attending: Internal Medicine

## 2020-11-02 DIAGNOSIS — I96 Gangrene, not elsewhere classified: Secondary | ICD-10-CM

## 2020-11-02 HISTORY — PX: AMPUTATION: SHX166

## 2020-11-02 LAB — BPAM FFP
Blood Product Expiration Date: 202203282359
Blood Product Expiration Date: 202203282359
ISSUE DATE / TIME: 202203271052
ISSUE DATE / TIME: 202203271547
Unit Type and Rh: 6200
Unit Type and Rh: 6200

## 2020-11-02 LAB — PREPARE FRESH FROZEN PLASMA
Unit division: 0
Unit division: 0

## 2020-11-02 LAB — GLUCOSE, CAPILLARY
Glucose-Capillary: 75 mg/dL (ref 70–99)
Glucose-Capillary: 74 mg/dL (ref 70–99)
Glucose-Capillary: 76 mg/dL (ref 70–99)
Glucose-Capillary: 152 mg/dL — ABNORMAL HIGH (ref 70–99)
Glucose-Capillary: 316 mg/dL — ABNORMAL HIGH (ref 70–99)
Glucose-Capillary: 237 mg/dL — ABNORMAL HIGH (ref 70–99)

## 2020-11-02 SURGERY — AMPUTATION BELOW KNEE
Anesthesia: Regional | Site: Knee | Laterality: Right

## 2020-11-02 MED ORDER — 0.9 % SODIUM CHLORIDE (POUR BTL) OPTIME
TOPICAL | Status: DC | PRN
  Administered 2020-11-02: 2000 mL

## 2020-11-02 MED ORDER — PROPOFOL 10 MG/ML IV BOLUS
INTRAVENOUS | Status: DC | PRN
  Administered 2020-11-02: 150 mg via INTRAVENOUS

## 2020-11-02 MED ORDER — ROPIVACAINE HCL 5 MG/ML IJ SOLN
INTRAMUSCULAR | Status: DC | PRN
  Administered 2020-11-02: 40 mL via PERINEURAL

## 2020-11-02 MED ORDER — FENTANYL CITRATE (PF) 250 MCG/5ML IJ SOLN
INTRAMUSCULAR | Status: DC | PRN
  Administered 2020-11-02: 50 ug via INTRAVENOUS

## 2020-11-02 MED ORDER — DOCUSATE SODIUM 100 MG PO CAPS
100.0000 mg | ORAL_CAPSULE | Freq: Every day | ORAL | Status: DC
  Administered 2020-11-03 – 2020-11-06 (×4): 100 mg via ORAL
  Filled 2020-11-02 (×5): qty 1

## 2020-11-02 MED ORDER — HYDRALAZINE HCL 20 MG/ML IJ SOLN
5.0000 mg | INTRAMUSCULAR | Status: DC | PRN
Start: 2020-11-02 — End: 2020-11-07

## 2020-11-02 MED ORDER — DARBEPOETIN ALFA 100 MCG/0.5ML IJ SOSY
100.0000 ug | PREFILLED_SYRINGE | INTRAMUSCULAR | Status: DC
  Filled 2020-11-02: qty 0.5

## 2020-11-02 MED ORDER — MIDAZOLAM HCL 2 MG/2ML IJ SOLN
INTRAMUSCULAR | Status: AC
  Filled 2020-11-02: qty 2

## 2020-11-02 MED ORDER — ONDANSETRON HCL 4 MG/2ML IJ SOLN
INTRAMUSCULAR | Status: DC | PRN
  Administered 2020-11-02: 4 mg via INTRAVENOUS

## 2020-11-02 MED ORDER — HEPARIN SODIUM (PORCINE) 5000 UNIT/ML IJ SOLN: 5000.0000 [IU] | Freq: Three times a day (TID) | INTRAMUSCULAR | Status: DC

## 2020-11-02 MED ORDER — PROMETHAZINE HCL 25 MG/ML IJ SOLN: 6.2500 mg | INTRAMUSCULAR | Status: DC | PRN

## 2020-11-02 MED ORDER — SODIUM CHLORIDE 0.9 % IV SOLN: INTRAVENOUS | Status: DC | PRN

## 2020-11-02 MED ORDER — PHENYLEPHRINE HCL-NACL 10-0.9 MG/250ML-% IV SOLN
INTRAVENOUS | Status: DC | PRN
  Administered 2020-11-02: 50 ug/min via INTRAVENOUS

## 2020-11-02 MED ORDER — FENTANYL CITRATE (PF) 100 MCG/2ML IJ SOLN
25.0000 ug | INTRAMUSCULAR | Status: DC | PRN
Start: 2020-11-02 — End: 2020-11-02

## 2020-11-02 MED ORDER — DIPHENHYDRAMINE HCL 12.5 MG/5ML PO ELIX: 12.5000 mg | ORAL_SOLUTION | ORAL | Status: DC | PRN

## 2020-11-02 MED ORDER — ACETAMINOPHEN 325 MG PO TABS: 325.0000 mg | ORAL_TABLET | Freq: Four times a day (QID) | ORAL | Status: DC | PRN

## 2020-11-02 MED ORDER — LIDOCAINE HCL (CARDIAC) PF 100 MG/5ML IV SOSY
PREFILLED_SYRINGE | INTRAVENOUS | Status: DC | PRN
  Administered 2020-11-02: 20 mg via INTRAVENOUS

## 2020-11-02 MED ORDER — LABETALOL HCL 5 MG/ML IV SOLN
10.0000 mg | INTRAVENOUS | Status: DC | PRN
Start: 2020-11-02 — End: 2020-11-07

## 2020-11-02 MED ORDER — METOPROLOL TARTRATE 5 MG/5ML IV SOLN: 2.0000 mg | INTRAVENOUS | Status: DC | PRN

## 2020-11-02 MED ORDER — MAGNESIUM SULFATE 2 GM/50ML IV SOLN
2.0000 g | Freq: Every day | INTRAVENOUS | Status: DC | PRN
  Filled 2020-11-02: qty 50

## 2020-11-02 MED ORDER — ACETAMINOPHEN 500 MG PO TABS
500.0000 mg | ORAL_TABLET | Freq: Four times a day (QID) | ORAL | Status: AC
  Administered 2020-11-02 – 2020-11-03 (×4): 500 mg via ORAL
  Filled 2020-11-02 (×4): qty 1

## 2020-11-02 MED ORDER — CEFAZOLIN SODIUM-DEXTROSE 2-3 GM-%(50ML) IV SOLR
INTRAVENOUS | Status: DC | PRN
  Administered 2020-11-02: 2 g via INTRAVENOUS

## 2020-11-02 MED ORDER — DEXAMETHASONE SODIUM PHOSPHATE 10 MG/ML IJ SOLN
INTRAMUSCULAR | Status: DC | PRN
  Administered 2020-11-02: 6 mg via INTRAVENOUS

## 2020-11-02 MED ORDER — ALUM & MAG HYDROXIDE-SIMETH 200-200-20 MG/5ML PO SUSP: 15.0000 mL | ORAL | Status: DC | PRN

## 2020-11-02 MED ORDER — FENTANYL CITRATE (PF) 250 MCG/5ML IJ SOLN
INTRAMUSCULAR | Status: AC
  Filled 2020-11-02: qty 5

## 2020-11-02 MED ORDER — GUAIFENESIN-DM 100-10 MG/5ML PO SYRP
15.0000 mL | ORAL_SOLUTION | ORAL | Status: DC | PRN
Start: 2020-11-02 — End: 2020-11-07

## 2020-11-02 MED ORDER — ONDANSETRON HCL 4 MG/2ML IJ SOLN: 4.0000 mg | Freq: Four times a day (QID) | INTRAMUSCULAR | Status: DC | PRN

## 2020-11-02 MED ORDER — POTASSIUM CHLORIDE CRYS ER 20 MEQ PO TBCR: 20.0000 meq | EXTENDED_RELEASE_TABLET | Freq: Every day | ORAL | Status: DC | PRN

## 2020-11-02 MED ORDER — PHENOL 1.4 % MT LIQD: 1.0000 | OROMUCOSAL | Status: DC | PRN

## 2020-11-02 MED ORDER — DEXAMETHASONE SODIUM PHOSPHATE 10 MG/ML IJ SOLN
INTRAMUSCULAR | Status: DC | PRN
  Administered 2020-11-02: 5 mg

## 2020-11-02 MED ORDER — PHENYLEPHRINE HCL (PRESSORS) 10 MG/ML IV SOLN
INTRAVENOUS | Status: DC | PRN
  Administered 2020-11-02: 80 ug via INTRAVENOUS

## 2020-11-02 MED ORDER — PROPOFOL 10 MG/ML IV BOLUS
INTRAVENOUS | Status: AC
  Filled 2020-11-02: qty 40

## 2020-11-02 MED ORDER — ACETAMINOPHEN 10 MG/ML IV SOLN: 1000.0000 mg | Freq: Once | INTRAVENOUS | Status: DC | PRN

## 2020-11-02 MED ORDER — PANTOPRAZOLE SODIUM 40 MG PO TBEC
40.0000 mg | DELAYED_RELEASE_TABLET | Freq: Every day | ORAL | Status: DC
  Administered 2020-11-02 – 2020-11-06 (×5): 40 mg via ORAL
  Filled 2020-11-02 (×5): qty 1

## 2020-11-02 SURGICAL SUPPLY — 60 items
BLADE LONG MED 31X9 (MISCELLANEOUS) IMPLANT
BLADE SAGITTAL (BLADE)
BLADE SAGITTAL 25.0X1.19X90 (BLADE) IMPLANT
BLADE SAW GIGLI 510 (BLADE) ×2 IMPLANT
BLADE SAW SAG 73X25 THK (BLADE) ×1
BLADE SAW SGTL 73X25 THK (BLADE) ×1 IMPLANT
BLADE SAW THK.89X75X18XSGTL (BLADE) IMPLANT
BLADE SURG 21 STRL SS (BLADE) ×4 IMPLANT
BNDG COHESIVE 6X5 TAN STRL LF (GAUZE/BANDAGES/DRESSINGS) ×2 IMPLANT
BNDG ELASTIC 4X5.8 VLCR STR LF (GAUZE/BANDAGES/DRESSINGS) ×2 IMPLANT
BNDG ELASTIC 6X5.8 VLCR STR LF (GAUZE/BANDAGES/DRESSINGS) ×2 IMPLANT
BNDG GAUZE ELAST 4 BULKY (GAUZE/BANDAGES/DRESSINGS) ×2 IMPLANT
BRUSH SCRUB EZ PLAIN DRY (MISCELLANEOUS) ×4 IMPLANT
CANISTER SUCT 3000ML PPV (MISCELLANEOUS) ×2 IMPLANT
CHLORAPREP W/TINT 26 (MISCELLANEOUS) ×2 IMPLANT
CLIP VESOCCLUDE MED 6/CT (CLIP) ×2 IMPLANT
COVER SURGICAL LIGHT HANDLE (MISCELLANEOUS) ×2 IMPLANT
COVER WAND RF STERILE (DRAPES) ×2 IMPLANT
DRAIN CHANNEL 19F RND (DRAIN) IMPLANT
DRAPE HALF SHEET 40X57 (DRAPES) ×2 IMPLANT
DRAPE ORTHO SPLIT 77X108 STRL (DRAPES) ×2
DRAPE SURG ORHT 6 SPLT 77X108 (DRAPES) ×2 IMPLANT
DRSG ADAPTIC 3X8 NADH LF (GAUZE/BANDAGES/DRESSINGS) ×2 IMPLANT
ELECT REM PT RETURN 9FT ADLT (ELECTROSURGICAL) ×2
ELECTRODE REM PT RTRN 9FT ADLT (ELECTROSURGICAL) ×1 IMPLANT
EVACUATOR SILICONE 100CC (DRAIN) IMPLANT
GAUZE SPONGE 4X4 12PLY STRL (GAUZE/BANDAGES/DRESSINGS) ×2 IMPLANT
GAUZE SPONGE 4X4 12PLY STRL LF (GAUZE/BANDAGES/DRESSINGS) ×2 IMPLANT
GAUZE XEROFORM 5X9 LF (GAUZE/BANDAGES/DRESSINGS) ×2 IMPLANT
GLOVE SURG POLYISO LF SZ6.5 (GLOVE) ×2 IMPLANT
GLOVE SURG SS PI 8.0 STRL IVOR (GLOVE) ×2 IMPLANT
GLOVE SURG UNDER POLY LF SZ6 (GLOVE) ×2 IMPLANT
GLOVE SURG UNDER POLY LF SZ7 (GLOVE) ×2 IMPLANT
GOWN STRL REUS W/ TWL LRG LVL3 (GOWN DISPOSABLE) ×2 IMPLANT
GOWN STRL REUS W/ TWL XL LVL3 (GOWN DISPOSABLE) ×1 IMPLANT
GOWN STRL REUS W/TWL LRG LVL3 (GOWN DISPOSABLE) ×2
GOWN STRL REUS W/TWL XL LVL3 (GOWN DISPOSABLE) ×1
KIT BASIN OR (CUSTOM PROCEDURE TRAY) ×2 IMPLANT
KIT TURNOVER KIT B (KITS) ×2 IMPLANT
NS IRRIG 1000ML POUR BTL (IV SOLUTION) ×2 IMPLANT
PACK GENERAL/GYN (CUSTOM PROCEDURE TRAY) ×2 IMPLANT
PAD ARMBOARD 7.5X6 YLW CONV (MISCELLANEOUS) ×4 IMPLANT
PENCIL SMOKE EVACUATOR (MISCELLANEOUS) ×2 IMPLANT
SPONGE LAP 18X18 RF (DISPOSABLE) ×2 IMPLANT
STAPLER SKIN 35 REG (STAPLE) ×2 IMPLANT
STAPLER VISISTAT 35W (STAPLE) ×2 IMPLANT
STOCKINETTE IMPERVIOUS LG (DRAPES) ×2 IMPLANT
SUT BONE WAX W31G (SUTURE) IMPLANT
SUT ETHILON 2 0 PSLX (SUTURE) ×8 IMPLANT
SUT ETHILON 3 0 PS 1 (SUTURE) IMPLANT
SUT SILK 0 TIES 10X30 (SUTURE) ×2 IMPLANT
SUT SILK 2 0 (SUTURE) ×1
SUT SILK 2 0 SH CR/8 (SUTURE) ×6 IMPLANT
SUT SILK 2-0 18XBRD TIE 12 (SUTURE) ×1 IMPLANT
SUT SILK 3 0 (SUTURE)
SUT SILK 3-0 18XBRD TIE 12 (SUTURE) IMPLANT
SUT VIC AB 2-0 CT1 18 (SUTURE) ×6 IMPLANT
TOWEL GREEN STERILE (TOWEL DISPOSABLE) ×4 IMPLANT
UNDERPAD 30X36 HEAVY ABSORB (UNDERPADS AND DIAPERS) ×2 IMPLANT
WATER STERILE IRR 1000ML POUR (IV SOLUTION) ×2 IMPLANT

## 2020-11-02 NOTE — Transfer of Care (Signed)
Immediate Anesthesia Transfer of Care Note  Patient: Jonathan Zebrowski Sr.  Procedure(s) Performed: RIGHT AMPUTATION BELOW KNEE (Right Knee)  Patient Location: PACU  Anesthesia Type:GA combined with regional for post-op pain  Level of Consciousness: awake, alert  and oriented  Airway & Oxygen Therapy: Patient Spontanous Breathing and Patient connected to nasal cannula oxygen  Post-op Assessment: Report given to RN and Post -op Vital signs reviewed and stable  Post vital signs: Reviewed and stable  Last Vitals:  Vitals Value Taken Time  BP    Temp    Pulse    Resp    SpO2      Last Pain:  Vitals:   11/02/20 0724  TempSrc:   PainSc: 0-No pain      Patients Stated Pain Goal: 0 ([id number])  Complications: No complications documented.

## 2020-11-02 NOTE — Progress Notes (Signed)
Per Dr. Rex Kras states that no labs are needed.

## 2020-11-02 NOTE — Anesthesia Procedure Notes (Signed)
Anesthesia Regional Block: Adductor canal block   Pre-Anesthetic Checklist: ,, timeout performed, Correct Patient, Correct Site, Correct Laterality, Correct Procedure, Correct Position, site marked, Risks and benefits discussed,  Surgical consent,  Pre-op evaluation,  At surgeon's request and post-op pain management  Laterality: Right  Prep: Dura Prep       Needles:  Injection technique: Single-shot  Needle Type: Echogenic Stimulator Needle     Needle Length: 10cm  Needle Gauge: 20     Additional Needles:   Procedures:,,,, ultrasound used (permanent image in chart),,,,  Narrative:  Start time: 11/02/2020 7:57 AM End time: 11/02/2020 7:59 AM Injection made incrementally with aspirations every 5 mL.  Performed by: Personally  Anesthesiologist: Darral Dash, DO  Additional Notes: Patient identified. Risks/Benefits/Options discussed with patient including but not limited to bleeding, infection, nerve damage, failed block, incomplete pain control. Patient expressed understanding and wished to proceed. All questions were answered. Sterile technique was used throughout the entire procedure. Please see nursing notes for vital signs. Aspirated in 5cc intervals with injection for negative confirmation. Patient was given instructions on fall risk and not to get out of bed. All questions and concerns addressed with instructions to call with any issues or inadequate analgesia.

## 2020-11-02 NOTE — Plan of Care (Signed)
  Problem: Activity: Goal: Risk for activity intolerance will decrease Outcome: Progressing   Problem: Coping: Goal: Level of anxiety will decrease Outcome: Progressing   Problem: Safety: Goal: Ability to remain free from injury will improve Outcome: Progressing   Problem: Pain Managment: Goal: General experience of comfort will improve Outcome: Progressing   Problem: Skin Integrity: Goal: Risk for impaired skin integrity will decrease Outcome: Progressing

## 2020-11-02 NOTE — Anesthesia Postprocedure Evaluation (Signed)
Anesthesia Post Note  Patient: Jonathan Tantillo Sr.  Procedure(s) Performed: RIGHT AMPUTATION BELOW KNEE (Right Knee)     Patient location during evaluation: PACU Anesthesia Type: Regional and General Level of consciousness: awake and alert Pain management: pain level controlled Vital Signs Assessment: post-procedure vital signs reviewed and stable Respiratory status: spontaneous breathing, nonlabored ventilation, respiratory function stable and patient connected to nasal cannula oxygen Cardiovascular status: blood pressure returned to baseline and stable Postop Assessment: no apparent nausea or vomiting Anesthetic complications: no   No complications documented.  Last Vitals:  Vitals:   11/02/20 1020 11/02/20 1041  BP: (!) 143/73 (!) 142/73  Pulse: 78   Resp: 17 16  Temp: (!) 36.3 C 36.9 C  SpO2: 98% 98%    Last Pain:  Vitals:   11/02/20 1041  TempSrc: Oral  PainSc: 0-No pain                 Belenda Cruise P Marsean Elkhatib

## 2020-11-02 NOTE — Op Note (Signed)
DATE OF SERVICE: 11/02/2020  PATIENT:  Jonathan Alexandria Sr.  70 y.o. male  PRE-OPERATIVE DIAGNOSIS:  Non-salvageable R foot gangrene and osteomyelitis  POST-OPERATIVE DIAGNOSIS:  Same  PROCEDURE:   Right below knee amputation  SURGEON:  Surgeon(s) and Role:    * [name], MD - Primary  ASSISTANT: Paulo Fruit, PA-C  An assistant was required to facilitate exposure and expedite the case.  ANESTHESIA:   general  EBL: 149m  BLOOD ADMINISTERED:none  DRAINS: none   LOCAL MEDICATIONS USED:  NONE  SPECIMEN:  Residual right limb  COUNTS: confirmed correct.  TOURNIQUET:    Total Tourniquet Time Documented: Thigh (Right) - 12 minutes Total: Thigh (Right) - 12 minutes  PATIENT DISPOSITION:  PACU - hemodynamically stable.   Delay start of Pharmacological VTE agent (>24hrs) due to surgical blood loss or risk of bleeding: no  INDICATION FOR PROCEDURE: Jonathan SeelmanSr. is a 70y.o. male with progressive right foot gangrene and osteomyelitis despite revascularization. After careful discussion of risks, benefits, and alternatives the patient was offered right below knee amputation.  The patient understood and wished to proceed.  OPERATIVE FINDINGS: unremarkable below knee amputation. Healthy tissue at amputation margin.  DESCRIPTION OF PROCEDURE: After identification of the patient in the pre-operative holding area, the patient was transferred to the operating room. The patient was positioned supine on the operating room table. Anesthesia was induced. The right leg was prepped and draped in standard fashion. A surgical pause was performed confirming correct patient, procedure, and operative location.  A sterile tourniquet was placed on the right thigh. The skin of the leg was marked to plan the anterior incision 10 cm distal to the tibial tuberosity and then the marked out a posterior flap that was one third of the circumference of the calf in length.   I then exsanguinated the  leg with a Esmarch bandage and then inflated the pneumatic tourniquet to 250 mm Hg. I made the incisions for these flaps, and then dissected through the subcutaneous tissue, fascia, and muscle anteriorly with electrocautery.  I also similarly developed a thick posterior flap of muscle with electrocautery.  I elevated  the periosteal tissue superiorly so that the tibia was about 3 cm shorter than the anterior skin flap.  I then transected the tibia with a power saw and then took a wedge off the tibia anteriorly with the power saw.  Then I smoothed out the rough edges with a rasp.  In a similar fashion, I cut back the fibula about two centimeters higher than the level of the tibia with a bone cutter.  I then finished releasing the posterior muscle flap with electrocautery.    At this point, the specimen was passed off the field as the below-the-knee amputation.  At this point, I clamped all visibly bleeding arteries and veins using a combination of suture ligation with Vicryl and electrocautery.  The tourniquet was then deflated at this point.  Bleeding continued to be controlled with electrocautery and suture ligature.  The stump was washed off with sterile normal saline and no further active bleeding was noted.    I reapproximated the anterior and posterior fascia  with interrupted stitches of 2-0 Vicryl.  This was completed along the entire length of anterior and posterior fascia until there were no more loose space in the fascial line.  The skin was then reapproximated with staples.  The stump was washed off and dried.    The incision was dressed and an ACE  wrap was applied.    Upon completion of the case instrument and sharps counts were confirmed correct. The patient was transferred to the PACU in good condition. I was present for all portions of the procedure.  Jonathan Aline. Stanford Breed, MD [facility] Phone Number: [phone] 11/02/2020 9:38 AM

## 2020-11-02 NOTE — Anesthesia Procedure Notes (Signed)
Procedure Name: LMA Insertion Date/Time: 11/02/2020 8:19 AM Performed by: Mariea Clonts, CRNA Pre-anesthesia Checklist: Patient identified, Emergency Drugs available, Suction available and Patient being monitored Patient Re-evaluated:Patient Re-evaluated prior to induction Oxygen Delivery Method: Circle System Utilized Preoxygenation: Pre-oxygenation with 100% oxygen Induction Type: IV induction Ventilation: Mask ventilation without difficulty LMA: LMA inserted LMA Size: 5.0 Number of attempts: 1 Airway Equipment and Method: Bite block Placement Confirmation: positive ETCO2 Tube secured with: Tape Dental Injury: Teeth and Oropharynx as per pre-operative assessment

## 2020-11-02 NOTE — Progress Notes (Signed)
Gus Puma, RN called and notified the pt's HD tx has been moved to 11/03/2020.

## 2020-11-02 NOTE — Progress Notes (Addendum)
Subjective: No complaints status post R BKA, for HD today on schedule  Objective Vital signs in last 24 hours: Vitals:   11/02/20 0950 11/02/20 1005 11/02/20 1020 11/02/20 1041  BP: (!) 149/76 139/71 (!) 143/73 (!) 142/73  Pulse: 72 74 78   Resp: '20 17 17 16  '$ Temp: 98 F (36.7 C)  (!) 97.4 F (36.3 C) 98.5 F (36.9 C)  TempSrc:    Oral  SpO2: 100% 100% 98% 98%  Weight:      Height:       Weight change: 1.6 kg  Physical Exam General:  Alert elderly male, nad  Heart: RRR  no MRG Lungs: Clear bilaterally nonlabored  Abdomen: soft non-tender, nondistended Extremities: No sig LE edema: R BKA bandage dry, clean Dialysis Access: RUE AVF +bruit    Dialysis Orders: [id number] 56mn 400/500 63kg 2/2 bath AVF Hep 3000 Mircera 75 q 2 wks (last 3/16)  Calcitriol 1.75 tiw  Problem/Plan: 1. R foot osteo --Empiric antibioticsper primary. VVSfollowing -SP  BKAthis a.m.  3/28  2. PVD -- s/p R SFA stenting, RCFAendarterectomy. On Plavix.  3. ESRD -HD MWF.  K + 5.0,HD today on schedule  4. [place], sinus on exam 5. Hypertension/volume - BP ok, anticipate lower dry weight with R BKA. UFas tolerated  6. Anemia - Hgb 11.4> 9.5>9.3 on 3/27  On ESAas outpatient restart in hospital, due next treatment 3/30 Aranesp 100 7. Metabolic bone disease - Corr Ca elevated/Phos ok. Continue binders. Hold calcitriol.  DErnest Haber PA-C [facility] [phone] [id number]/28/2022,2:34 PM  LOS: 3 days   Labs: Basic Metabolic Panel: Recent Labs  Lab 10/30/20 1001 10/31/20 0258 11/01/20 0105  NA 139 138 136  K 3.8 4.6 5.0  CL 97* 96* 97*  CO2 '31 27 30  '$ GLUCOSE 191* 95 171*  BUN 52* 61* 32*  CREATININE 8.30* 8.74* 5.51*  CALCIUM 9.6 9.8 9.2  PHOS  --   --  3.7   Liver Function Tests: Recent Labs  Lab 10/30/20 1001 11/01/20 0105  AST 12*  --   ALT 10  --   ALKPHOS 49  --   BILITOT 0.6  --   PROT  6.6  --   ALBUMIN 3.1* 2.8*   No results for input(s): LIPASE, AMYLASE in the last 168 hours. No results for input(s): AMMONIA in the last 168 hours. CBC: Recent Labs  Lab 10/30/20 1001 10/31/20 0258 10/31/20 2038 11/01/20 0105 11/01/20 1445  WBC 11.9* 11.8* 10.2 10.0 8.1  NEUTROABS 9.5*  --   --   --   --   HGB 10.3* 10.6* 11.4* 9.5* 9.3*  HCT 33.6* 33.5* 36.3* 29.3* 30.0*  MCV 97.7 93.1 94.0 92.4 94.9  PLT 255 261 256 247 228   Cardiac Enzymes: No results for input(s): CKTOTAL, CKMB, CKMBINDEX, TROPONINI in the last 168 hours. CBG: Recent Labs  Lab 11/01/20 2041 11/02/20 0659 11/02/20 0754 11/02/20 0950 11/02/20 1202  GLUCAP 172* 75 74 76 152*    Studies/Results: No results found. Medications: . magnesium sulfate bolus IVPB     . acetaminophen  500 mg Oral Q6H  . allopurinol  100 mg Oral Daily  . atorvastatin  40 mg Oral Daily  . Chlorhexidine Gluconate Cloth  6 each Topical Q0600  . [START ON 11/03/2020] docusate sodium  100 mg Oral Daily  . insulin aspart  0-6 Units Subcutaneous TID WC  . lidocaine-prilocaine  1 application Topical Once per day on Mon  Wed Fri  . multivitamin  1 tablet Oral QHS  . pantoprazole  40 mg Oral Daily  . predniSONE  5 mg Oral Q breakfast  . sevelamer carbonate  800 mg Oral TID WC

## 2020-11-02 NOTE — Progress Notes (Signed)
[facility] [place] PROGRESS NOTE  ASSESSMENT / PLAN: Jonathan Rosendahl Sr. is a 70 y.o. male with non-salvageable R foot gangrene / osteomyeltitis. For R BKA today.    SUBJECTIVE: No complaints. All questions RE: amputation answered.  OBJECTIVE: BP (!) 153/79 (BP Location: Left Arm)   Pulse 85   Temp 98.2 F (36.8 C) (Oral)   Resp 16   Ht 6' (1.829 m)   Wt 65 kg   SpO2 99%   BMI 19.43 kg/m   Intake/Output Summary (Last 24 hours) at 11/02/2020 0748 Last data filed at 11/01/2020 2137 Gross per 24 hour  Intake 2070 ml  Output -  Net 2070 ml    2+ R popliteal pulse No change in skin changes to foot  CBC Latest Ref Rng & Units 11/01/2020 11/01/2020 10/31/2020  WBC 4.0 - 10.5 K/uL 8.1 10.0 10.2  Hemoglobin 13.0 - 17.0 g/dL 9.3(L) 9.5(L) 11.4(L)  Hematocrit 39.0 - 52.0 % 30.0(L) 29.3(L) 36.3(L)  Platelets 150 - 400 K/uL 228 247 256     CMP Latest Ref Rng & Units 11/01/2020 10/31/2020 10/30/2020  Glucose 70 - 99 mg/dL 171(H) 95 191(H)  BUN 8 - 23 mg/dL 32(H) 61(H) 52(H)  Creatinine 0.61 - 1.24 mg/dL 5.51(H) 8.74(H) 8.30(H)  Sodium 135 - 145 mmol/L 136 138 139  Potassium 3.5 - 5.1 mmol/L 5.0 4.6 3.8  Chloride 98 - 111 mmol/L 97(L) 96(L) 97(L)  CO2 22 - 32 mmol/L '30 27 31  '$ Calcium 8.9 - 10.3 mg/dL 9.2 9.8 9.6  Total Protein 6.5 - 8.1 g/dL - - 6.6  Total Bilirubin 0.3 - 1.2 mg/dL - - 0.6  Alkaline Phos 38 - 126 U/L - - 49  AST 15 - 41 U/L - - 12(L)  ALT 0 - 44 U/L - - 10    Estimated Creatinine Clearance: 12 mL/min (A) (by C-G formula based on SCr of 5.51 mg/dL (H)).  Jonathan Aline. Stanford Breed, MD [facility] Phone Number: [phone] 11/02/2020 7:48 AM

## 2020-11-02 NOTE — Anesthesia Procedure Notes (Signed)
Anesthesia Regional Block: Popliteal block   Pre-Anesthetic Checklist: ,, timeout performed, Correct Patient, Correct Site, Correct Laterality, Correct Procedure, Correct Position, site marked, Risks and benefits discussed,  Surgical consent,  Pre-op evaluation,  At surgeon's request and post-op pain management  Laterality: Right  Prep: Dura Prep       Needles:  Injection technique: Single-shot  Needle Type: Echogenic Stimulator Needle     Needle Length: 10cm  Needle Gauge: 20     Additional Needles:   Procedures:,,,, ultrasound used (permanent image in chart),,,,  Narrative:  Start time: 11/02/2020 8:00 AM End time: 11/02/2020 8:02 AM Injection made incrementally with aspirations every 5 mL.  Performed by: Personally  Anesthesiologist: Darral Dash, DO  Additional Notes: Patient identified. Risks/Benefits/Options discussed with patient including but not limited to bleeding, infection, nerve damage, failed block, incomplete pain control. Patient expressed understanding and wished to proceed. All questions were answered. Sterile technique was used throughout the entire procedure. Please see nursing notes for vital signs. Aspirated in 5cc intervals with injection for negative confirmation. Patient was given instructions on fall risk and not to get out of bed. All questions and concerns addressed with instructions to call with any issues or inadequate analgesia.

## 2020-11-02 NOTE — Progress Notes (Signed)
Jonathan Stodghill Sr.  [id number] DOB: [birth year] DOA: 10/30/2020 PCP: Ginger Organ., MD    Brief Narrative:  [phone] with a hx of PAD s/p stenting Dec 2021 and Feb 2022, chronic nonhealing R foot wound, DM2, ESRD on HD MWF, and HTN who presented to the ER with worsening of his R foot ulcer.  He was being followed closely by his vascular surgery team, was evaluated as an outpatient on the day of his admission, and advised to present to the ER as his wound was clearly worsening.  Antimicrobials:  Cefepime 3/25 > Vancomycin 3/25 > Flagyl 3/25 >  DVT prophylaxis: IV heparin  Consultants:  Vascular Surgery Nephrology  Code Status: FULL CODE  Subjective: Labs ordered but not obtained this morning.  CBG well controlled.  Afebrile.  Blood pressure elevated above goal.  Resting comfortably in his room postoperatively enjoying a meal.  Reports no complaints whatsoever.  Assessment & Plan:  Right diabetic foot infection with osteomyelitis Status post BKA per vascular surgery today  Peripheral arterial disease  08/05/20 R SFA stenting 08/07/20 R iliofemoral endarterectomy and fifth toe ray amputation 09/09/20 R external iliac stentingand popliteal angioplasty 10/08/20 debridement of right fifth toe amputation ulceration Vascular surgery attending to this issue as noted above  Paroxysmal atrial fibrillation Transition to heparin drip during perioperative period once INR below therapeutic range -plan to resume heparin in a.m. if INR allows  Coumadin induced coagulopathy Had a self-limited episode of GI bleeding as well as bleeding from his dialysis access during this admission - s/p 4U FFP total, as well as '5mg'$  IV Vit K - follow INR and of course hold heparin until INR below therapeutic level  Anemia of chronic kidney disease as well as acute blood loss Follow hemoglobin in serial fashion with coagulopathy induced blood loss - of note patient has had a subsequent bowel movement with no  apparent bleeding  Recent Labs  Lab 10/30/20 1001 10/31/20 0258 10/31/20 2038 11/01/20 0105 11/01/20 1445  HGB 10.3* 10.6* 11.4* 9.5* 9.3*    Uncontrolled DM2 with peripheral vascular disease A1c 5.7 - CBG well controlled  ESRD on HD MWF Nephrology following  HTN Blood pressure variable - f/u post-op    Family Communication: No family present at time of exam Status is: Inpatient  Remains inpatient appropriate because:Inpatient level of care appropriate due to severity of illness   Dispo: The patient is from: Home              Anticipated d/c is to: unclear              Patient currently is not medically stable to d/c.   Difficult to place patient No    Objective: Blood pressure (!) 142/73, pulse 78, temperature 98.5 F (36.9 C), temperature source Oral, resp. rate 16, height 6' (1.829 m), weight 65 kg, SpO2 98 %.  Intake/Output Summary (Last 24 hours) at 11/02/2020 1630 Last data filed at 11/02/2020 0944 Gross per 24 hour  Intake 1336 ml  Output --  Net 1336 ml   Filed Weights   10/31/20 0708 10/31/20 1124 11/01/20 1112  Weight: 63.4 kg 61.9 kg 65 kg    Examination: General: No acute respiratory distress Lungs: Clear to auscultation bilaterally without wheezing Cardiovascular: Regular rate with no murmur or rub Abdomen: NT/ND, soft, BS positive, no rebound Extremities: No significant edema B LE   CBC: Recent Labs  Lab 10/30/20 1001 10/31/20 0258 10/31/20 2038 11/01/20 0105 11/01/20 1445  WBC 11.9*   < >  10.2 10.0 8.1  NEUTROABS 9.5*  --   --   --   --   HGB 10.3*   < > 11.4* 9.5* 9.3*  HCT 33.6*   < > 36.3* 29.3* 30.0*  MCV 97.7   < > 94.0 92.4 94.9  PLT 255   < > 256 247 228   < > = values in this interval not displayed.   Basic Metabolic Panel: Recent Labs  Lab 10/30/20 1001 10/31/20 0258 11/01/20 0105  NA 139 138 136  K 3.8 4.6 5.0  CL 97* 96* 97*  CO2 '31 27 30  '$ GLUCOSE 191* 95 171*  BUN 52* 61* 32*  CREATININE 8.30* 8.74* 5.51*   CALCIUM 9.6 9.8 9.2  PHOS  --   --  3.7   GFR: Estimated Creatinine Clearance: 12 mL/min (A) (by C-G formula based on SCr of 5.51 mg/dL (H)).  Liver Function Tests: Recent Labs  Lab 10/30/20 1001 11/01/20 0105  AST 12*  --   ALT 10  --   ALKPHOS 49  --   BILITOT 0.6  --   PROT 6.6  --   ALBUMIN 3.1* 2.8*    Coagulation Profile: Recent Labs  Lab 10/30/20 1855 10/31/20 0258 10/31/20 2038 11/01/20 0105 11/01/20 1445  INR 5.4* 6.1* 2.1* 2.4* 2.0*     HbA1C: Hgb A1c MFr Bld  Date/Time Value Ref Range Status  10/30/2020 06:57 PM 5.7 (H) 4.8 - 5.6 % Final  08/05/2020 08:39 PM 5.1 4.8 - 5.6 % Final    Comment:    (NOTE) Pre diabetes:          5.7%-6.4%  Diabetes:              >6.4%  Glycemic control for   <7.0% adults with diabetes     CBG: Recent Labs  Lab 11/01/20 2041 11/02/20 0659 11/02/20 0754 11/02/20 0950 11/02/20 1202  GLUCAP 172* 75 74 76 152*    Recent Results (from the past 240 hour(s))  Blood culture (routine x 2)     Status: None (Preliminary result)   Collection Time: 10/30/20 11:18 AM   Specimen: BLOOD  Result Value Ref Range Status   Specimen Description BLOOD SITE NOT SPECIFIED  Final   Special Requests   Final    BOTTLES DRAWN AEROBIC ONLY Blood Culture results may not be optimal due to an inadequate volume of blood received in culture bottles   Culture   Final    NO GROWTH 3 DAYS Performed at [facility], [place] [address] [place], [place] [address]    Report Status PENDING  Incomplete  Resp Panel by RT-PCR (Flu A&B, Covid) Nasopharyngeal Swab     Status: None   Collection Time: 10/30/20 11:28 AM   Specimen: Nasopharyngeal Swab; Nasopharyngeal(NP) swabs in vial transport medium  Result Value Ref Range Status   SARS Coronavirus 2 by RT PCR NEGATIVE NEGATIVE Final    Comment: (NOTE) SARS-CoV-2 target nucleic acids are NOT DETECTED.  The SARS-CoV-2 RNA is generally detectable in upper respiratory specimens during the acute  phase of infection. The lowest concentration of SARS-CoV-2 viral copies this assay can detect is 138 copies/mL. A negative result does not preclude SARS-Cov-2 infection and should not be used as the sole basis for treatment or other patient management decisions. A negative result may occur with  improper specimen collection/handling, submission of specimen other than nasopharyngeal swab, presence of viral mutation(s) within the areas targeted by this assay, and inadequate number  of viral copies(<138 copies/mL). A negative result must be combined with clinical observations, patient history, and epidemiological information. The expected result is Negative.  Fact Sheet for Patients:  EntrepreneurPulse.com.au  Fact Sheet for Healthcare Providers:  IncredibleEmployment.be  This test is no t yet approved or cleared by the [place] FDA and  has been authorized for detection and/or diagnosis of SARS-CoV-2 by FDA under an Emergency Use Authorization (EUA). This EUA will remain  in effect (meaning this test can be used) for the duration of the COVID-19 declaration under Section 564(b)(1) of the Act, 21 U.S.C.section 360bbb-3(b)(1), unless the authorization is terminated  or revoked sooner.       Influenza A by PCR NEGATIVE NEGATIVE Final   Influenza B by PCR NEGATIVE NEGATIVE Final    Comment: (NOTE) The Xpert Xpress SARS-CoV-2/FLU/RSV plus assay is intended as an aid in the diagnosis of influenza from Nasopharyngeal swab specimens and should not be used as a sole basis for treatment. Nasal washings and aspirates are unacceptable for Xpert Xpress SARS-CoV-2/FLU/RSV testing.  Fact Sheet for Patients: EntrepreneurPulse.com.au  Fact Sheet for Healthcare Providers: IncredibleEmployment.be  This test is not yet approved or cleared by the [place] FDA and has been authorized for detection and/or diagnosis of  SARS-CoV-2 by FDA under an Emergency Use Authorization (EUA). This EUA will remain in effect (meaning this test can be used) for the duration of the COVID-19 declaration under Section 564(b)(1) of the Act, 21 U.S.C. section 360bbb-3(b)(1), unless the authorization is terminated or revoked.  Performed at [facility], [place] [address], [place], [place] [address]   Blood culture (routine x 2)     Status: None (Preliminary result)   Collection Time: 10/30/20  1:07 PM   Specimen: BLOOD  Result Value Ref Range Status   Specimen Description BLOOD SITE NOT SPECIFIED  Final   Special Requests   Final    BOTTLES DRAWN AEROBIC ONLY Blood Culture results may not be optimal due to an inadequate volume of blood received in culture bottles   Culture   Final    NO GROWTH 3 DAYS Performed at [facility], [place] [address] [place], [place] [address]    Report Status PENDING  Incomplete  Surgical PCR screen     Status: None   Collection Time: 11/01/20  1:48 PM   Specimen: Nasal Mucosa; Nasal Swab  Result Value Ref Range Status   MRSA, PCR NEGATIVE NEGATIVE Final   Staphylococcus aureus NEGATIVE NEGATIVE Final    Comment: (NOTE) The Xpert SA Assay (FDA approved for NASAL specimens in patients 59 years of age and older), is one component of a comprehensive surveillance program. It is not intended to diagnose infection nor to guide or monitor treatment. Performed at [facility], [place] [address], [place], [place] [address]      Scheduled Meds: . acetaminophen  500 mg Oral Q6H  . allopurinol  100 mg Oral Daily  . atorvastatin  40 mg Oral Daily  . Chlorhexidine Gluconate Cloth  6 each Topical Q0600  . [START ON 11/04/2020] darbepoetin (ARANESP) injection - DIALYSIS  100 mcg Intravenous Q Wed-HD  . [START ON 11/03/2020] docusate sodium  100 mg Oral Daily  . insulin aspart  0-6 Units Subcutaneous TID WC  . lidocaine-prilocaine  1 application Topical Once per day on Mon Wed Fri  .  multivitamin  1 tablet Oral QHS  . pantoprazole  40 mg Oral Daily  . predniSONE  5 mg  Oral Q breakfast  . sevelamer carbonate  800 mg Oral TID WC   Continuous Infusions: . magnesium sulfate bolus IVPB       LOS: 3 days   Cherene Altes, MD [facility] Office  [phone] Pager - Text Page per Shea Evans  If 7PM-7AM, please contact night-coverage per Amion 11/02/2020, 4:30 PM

## 2020-11-03 ENCOUNTER — Ambulatory Visit: Payer: Medicare Other | Admitting: Vascular Surgery

## 2020-11-03 ENCOUNTER — Ambulatory Visit (HOSPITAL_COMMUNITY): Payer: Medicare Other

## 2020-11-03 ENCOUNTER — Encounter (HOSPITAL_COMMUNITY): Payer: Self-pay | Admitting: Vascular Surgery

## 2020-11-03 LAB — BASIC METABOLIC PANEL
Anion gap: 13 (ref 5–15)
BUN: 65 mg/dL — ABNORMAL HIGH (ref 8–23)
CO2: 23 mmol/L (ref 22–32)
Calcium: 9.2 mg/dL (ref 8.9–10.3)
Chloride: 97 mmol/L — ABNORMAL LOW (ref 98–111)
Creatinine, Ser: 8.35 mg/dL — ABNORMAL HIGH (ref 0.61–1.24)
GFR, Estimated: 6 mL/min — ABNORMAL LOW (ref >60)
Glucose, Bld: 140 mg/dL — ABNORMAL HIGH (ref 70–99)
Potassium: 5.5 mmol/L — ABNORMAL HIGH (ref 3.5–5.1)
Sodium: 133 mmol/L — ABNORMAL LOW (ref 135–145)

## 2020-11-03 LAB — CBC
HCT: 25.4 % — ABNORMAL LOW (ref 39.0–52.0)
Hemoglobin: 8.1 g/dL — ABNORMAL LOW (ref 13.0–17.0)
MCH: 29.1 pg (ref 26.0–34.0)
MCHC: 31.9 g/dL (ref 30.0–36.0)
MCV: 91.4 fL (ref 80.0–100.0)
Platelets: 219 10*3/uL (ref 150–400)
RBC: 2.78 MIL/uL — ABNORMAL LOW (ref 4.22–5.81)
RDW: 18.2 % — ABNORMAL HIGH (ref 11.5–15.5)
WBC: 12.1 10*3/uL — ABNORMAL HIGH (ref 4.0–10.5)
nRBC: 0 % (ref 0.0–0.2)

## 2020-11-03 LAB — GLUCOSE, CAPILLARY
Glucose-Capillary: 81 mg/dL (ref 70–99)
Glucose-Capillary: 131 mg/dL — ABNORMAL HIGH (ref 70–99)
Glucose-Capillary: 195 mg/dL — ABNORMAL HIGH (ref 70–99)

## 2020-11-03 LAB — PROTIME-INR
INR: 1.5 — ABNORMAL HIGH (ref 0.8–1.2)
Prothrombin Time: 17.2 seconds — ABNORMAL HIGH (ref 11.4–15.2)

## 2020-11-03 LAB — HEPARIN LEVEL (UNFRACTIONATED): Heparin Unfractionated: <0.1 IU/mL — ABNORMAL LOW (ref 0.30–0.70)

## 2020-11-03 MED ORDER — SODIUM CHLORIDE 0.9 % IV SOLN: 100.0000 mL | INTRAVENOUS | Status: DC | PRN

## 2020-11-03 MED ORDER — ALTEPLASE 2 MG IJ SOLR: 2.0000 mg | Freq: Once | INTRAMUSCULAR | Status: DC | PRN

## 2020-11-03 MED ORDER — HEPARIN (PORCINE) 25000 UT/250ML-% IV SOLN
950.0000 [IU]/h | INTRAVENOUS | Status: AC
  Filled 2020-11-03: qty 250

## 2020-11-03 MED ORDER — HYDROMORPHONE HCL 1 MG/ML IJ SOLN
INTRAMUSCULAR | Status: AC
  Filled 2020-11-03: qty 0.5

## 2020-11-03 MED ORDER — INSULIN GLARGINE 100 UNIT/ML ~~LOC~~ SOLN
8.0000 [IU] | Freq: Every day | SUBCUTANEOUS | Status: DC
  Administered 2020-11-03: 8 [IU] via SUBCUTANEOUS
  Filled 2020-11-03 (×2): qty 0.08

## 2020-11-03 MED ORDER — WARFARIN - PHARMACIST DOSING INPATIENT: Freq: Every day | Status: DC

## 2020-11-03 MED ORDER — PENTAFLUOROPROP-TETRAFLUOROETH EX AERO: 1.0000 "application " | INHALATION_SPRAY | CUTANEOUS | Status: DC | PRN

## 2020-11-03 MED ORDER — HEPARIN (PORCINE) 25000 UT/250ML-% IV SOLN
1250.0000 [IU]/h | INTRAVENOUS | Status: DC
  Administered 2020-11-03: 950 [IU]/h via INTRAVENOUS
  Administered 2020-11-04: 1150 [IU]/h via INTRAVENOUS
  Administered 2020-11-05: 1250 [IU]/h via INTRAVENOUS
  Filled 2020-11-03 (×5): qty 250

## 2020-11-03 MED ORDER — LIDOCAINE HCL (PF) 1 % IJ SOLN: 5.0000 mL | INTRAMUSCULAR | Status: DC | PRN

## 2020-11-03 MED ORDER — WARFARIN SODIUM 6 MG PO TABS
6.0000 mg | ORAL_TABLET | Freq: Once | ORAL | Status: AC
  Administered 2020-11-03: 6 mg via ORAL
  Filled 2020-11-03: qty 1

## 2020-11-03 MED ORDER — LIDOCAINE-PRILOCAINE 2.5-2.5 % EX CREA
1.0000 "application " | TOPICAL_CREAM | CUTANEOUS | Status: DC | PRN
  Filled 2020-11-03: qty 5

## 2020-11-03 MED ORDER — HEPARIN SODIUM (PORCINE) 1000 UNIT/ML DIALYSIS: 1000.0000 [IU] | INTRAMUSCULAR | Status: DC | PRN

## 2020-11-03 NOTE — Progress Notes (Signed)
Jonathan Ibara Sr.  [id number] DOB: 1950/12/26 DOA: 10/30/2020 PCP: Ginger Organ., MD    Brief Narrative:  [phone] with a hx of PAD s/p stenting Dec 2021 and Feb 2022, chronic nonhealing R foot wound, DM2, ESRD on HD MWF, and HTN who presented to the ER with worsening of his R foot ulcer.  He was being followed closely by his vascular surgery team, was evaluated as an outpatient on the day of his admission, and advised to present to the ER as his wound was clearly worsening.  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  Vital signs stable.  Saturation 100% on room air.  CBG above goal.  Flat affect.  Appears to be mildly confused.  Denies chest pain nausea vomiting shortness of breath or abdominal pain.  Assessment & Plan:  Right diabetic foot infection with osteomyelitis Status post BKA per vascular surgery 3/28 -ongoing wound care per vascular surgery -rehab  Peripheral arterial disease  08/05/20 R SFA stenting 08/07/20 R iliofemoral endarterectomy and fifth toe ray amputation 09/09/20 R external iliac stentingand popliteal angioplasty 10/08/20 debridement of right fifth toe amputation ulceration  Paroxysmal atrial fibrillation IV heparin to be utilized beginning today -assuming no significant bleeding appreciated during the day we will also resume Coumadin therapy tonight with INR now less than 2.0  Coumadin induced coagulopathy Had a self-limited episode of GI bleeding as well as bleeding from his dialysis access during this admission - s/p 4U FFP total, as well as '5mg'$  IV Vit K -INR now less than 2.0 with no evidence of spontaneous bleeding -plan to cautiously resume Coumadin tonight if no bleeding today -resumption of therapeutic INR will likely be delayed given need for vitamin K earlier in  hospital stay  Anemia of chronic kidney disease as well as acute blood loss Follow hemoglobin in serial fashion with coagulopathy induced blood loss - of note patient has had a subsequent bowel movement with no apparent bleeding  Recent Labs  Lab 10/31/20 0258 10/31/20 2038 11/01/20 0105 11/01/20 1445 11/03/20 0304  HGB 10.6* 11.4* 9.5* 9.3* 8.1*    Uncontrolled DM2 with peripheral vascular disease A1c 5.7 - CBG above goal -adjust treatment and follow  ESRD on HD MWF Nephrology following with ongoing inpatient dialysis  Altered mental status Patient's baseline mental status not clear at this time -perhaps there is an element of sundowning -monitor clinically -check [id number] and folic acid  HTN Well-controlled at this time   Family Communication: No family present at time of exam Status is: Inpatient  Remains inpatient appropriate because:Inpatient level of care appropriate due to severity of illness   Dispo: The patient is from: Home              Anticipated d/c is to: unclear              Patient currently is not medically stable to d/c.   Difficult to place patient No    Objective: Blood pressure 121/67, pulse 78, temperature 98.2 F (36.8 C), temperature source Oral, resp. rate 16, height 6' (1.829 m), weight 62.8 kg, SpO2 100 %.  Intake/Output Summary (Last 24 hours) at 11/03/2020 1050 Last data filed at 11/02/2020 2100 Gross per 24 hour  Intake --  Output 0 ml  Net 0 ml   [employer]  10/31/20 1124 11/01/20 1112 11/03/20 0730  Weight: 61.9 kg 65 kg 62.8 kg    Examination: General: No acute respiratory distress Lungs: Clear to auscultation bilaterally -no wheezing Cardiovascular: Regular rate with no murmur -no rub Abdomen: NT/ND, soft, BS positive, no rebound Extremities: No significant edema B LE -surgical wound dressed and dry  CBC: Recent Labs  Lab 10/30/20 1001 10/31/20 0258 11/01/20 0105 11/01/20 1445 11/03/20 0304  WBC 11.9*   < > 10.0  8.1 12.1*  NEUTROABS 9.5*  --   --   --   --   HGB 10.3*   < > 9.5* 9.3* 8.1*  HCT 33.6*   < > 29.3* 30.0* 25.4*  MCV 97.7   < > 92.4 94.9 91.4  PLT 255   < > 247 228 219   < > = values in this interval not displayed.   Basic Metabolic Panel: Recent Labs  Lab 10/31/20 0258 11/01/20 0105 11/03/20 0304  NA 138 136 133*  K 4.6 5.0 5.5*  CL 96* 97* 97*  CO2 '27 30 23  '$ GLUCOSE 95 171* 140*  BUN 61* 32* 65*  CREATININE 8.74* 5.51* 8.35*  CALCIUM 9.8 9.2 9.2  PHOS  --  3.7  --    GFR: Estimated Creatinine Clearance: 7.6 mL/min (A) (by C-G formula based on SCr of 8.35 mg/dL (H)).  Liver Function Tests: Recent Labs  Lab 10/30/20 1001 11/01/20 0105  AST 12*  --   ALT 10  --   ALKPHOS 49  --   BILITOT 0.6  --   PROT 6.6  --   ALBUMIN 3.1* 2.8*    Coagulation Profile: Recent Labs  Lab 10/31/20 0258 10/31/20 2038 11/01/20 0105 11/01/20 1445 11/03/20 0620  INR 6.1* 2.1* 2.4* 2.0* 1.5*     HbA1C: Hgb A1c MFr Bld  Date/Time Value Ref Range Status  10/30/2020 06:57 PM 5.7 (H) 4.8 - 5.6 % Final  08/05/2020 08:39 PM 5.1 4.8 - 5.6 % Final    Comment:    (NOTE) Pre diabetes:          5.7%-6.4%  Diabetes:              >6.4%  Glycemic control for   <7.0% adults with diabetes     CBG: Recent Labs  Lab 11/02/20 0754 11/02/20 0950 11/02/20 1202 11/02/20 1655 11/02/20 1948  GLUCAP 74 76 152* 316* 237*    Recent Results (from the past 240 hour(s))  Blood culture (routine x 2)     Status: None (Preliminary result)   Collection Time: 10/30/20 11:18 AM   Specimen: BLOOD  Result Value Ref Range Status   Specimen Description BLOOD SITE NOT SPECIFIED  Final   Special Requests   Final    BOTTLES DRAWN AEROBIC ONLY Blood Culture results may not be optimal due to an inadequate volume of blood received in culture bottles   Culture   Final    NO GROWTH 4 DAYS Performed at [facility], [place] [address], [place], [place] [address]    Report Status PENDING   Incomplete  Resp Panel by RT-PCR (Flu A&B, Covid) Nasopharyngeal Swab     Status: None   Collection Time: 10/30/20 11:28 AM   Specimen: Nasopharyngeal Swab; Nasopharyngeal(NP) swabs in vial transport medium  Result Value Ref Range Status   SARS Coronavirus 2 by RT PCR NEGATIVE NEGATIVE Final    Comment: (NOTE) SARS-CoV-2 target nucleic acids are NOT DETECTED.  The SARS-CoV-2 RNA is generally detectable  in upper respiratory specimens during the acute phase of infection. The lowest concentration of SARS-CoV-2 viral copies this assay can detect is 138 copies/mL. A negative result does not preclude SARS-Cov-2 infection and should not be used as the sole basis for treatment or other patient management decisions. A negative result may occur with  improper specimen collection/handling, submission of specimen other than nasopharyngeal swab, presence of viral mutation(s) within the areas targeted by this assay, and inadequate number of viral copies(<138 copies/mL). A negative result must be combined with clinical observations, patient history, and epidemiological information. The expected result is Negative.  Fact Sheet for Patients:  EntrepreneurPulse.com.au  Fact Sheet for Healthcare Providers:  IncredibleEmployment.be  This test is no t yet approved or cleared by the [place] FDA and  has been authorized for detection and/or diagnosis of SARS-CoV-2 by FDA under an Emergency Use Authorization (EUA). This EUA will remain  in effect (meaning this test can be used) for the duration of the COVID-19 declaration under Section 564(b)(1) of the Act, 21 U.S.C.section 360bbb-3(b)(1), unless the authorization is terminated  or revoked sooner.       Influenza A by PCR NEGATIVE NEGATIVE Final   Influenza B by PCR NEGATIVE NEGATIVE Final    Comment: (NOTE) The Xpert Xpress SARS-CoV-2/FLU/RSV plus assay is intended as an aid in the diagnosis of influenza  from Nasopharyngeal swab specimens and should not be used as a sole basis for treatment. Nasal washings and aspirates are unacceptable for Xpert Xpress SARS-CoV-2/FLU/RSV testing.  Fact Sheet for Patients: EntrepreneurPulse.com.au  Fact Sheet for Healthcare Providers: IncredibleEmployment.be  This test is not yet approved or cleared by the [place] FDA and has been authorized for detection and/or diagnosis of SARS-CoV-2 by FDA under an Emergency Use Authorization (EUA). This EUA will remain in effect (meaning this test can be used) for the duration of the COVID-19 declaration under Section 564(b)(1) of the Act, 21 U.S.C. section 360bbb-3(b)(1), unless the authorization is terminated or revoked.  Performed at [facility], [place] [address] [place], [place] [address]   Blood culture (routine x 2)     Status: None (Preliminary result)   Collection Time: 10/30/20  1:07 PM   Specimen: BLOOD  Result Value Ref Range Status   Specimen Description BLOOD SITE NOT SPECIFIED  Final   Special Requests   Final    BOTTLES DRAWN AEROBIC ONLY Blood Culture results may not be optimal due to an inadequate volume of blood received in culture bottles   Culture   Final    NO GROWTH 4 DAYS Performed at [facility], [place] [address] [place], [place] [address]    Report Status PENDING  Incomplete  Surgical PCR screen     Status: None   Collection Time: 11/01/20  1:48 PM   Specimen: Nasal Mucosa; Nasal Swab  Result Value Ref Range Status   MRSA, PCR NEGATIVE NEGATIVE Final   Staphylococcus aureus NEGATIVE NEGATIVE Final    Comment: (NOTE) The Xpert SA Assay (FDA approved for NASAL specimens in patients 60 years of age and older), is one component of a comprehensive surveillance program. It is not intended to diagnose infection nor to guide or monitor treatment. Performed at [facility], [place] [address], [place], [place] [address]       Scheduled Meds: . allopurinol  100 mg Oral Daily  . atorvastatin  40 mg Oral Daily  . Chlorhexidine Gluconate Cloth  6 each Topical Q0600  . [START  ON 11/04/2020] darbepoetin (ARANESP) injection - DIALYSIS  100 mcg Intravenous Q Wed-HD  . docusate sodium  100 mg Oral Daily  . HYDROmorphone      . insulin aspart  0-6 Units Subcutaneous TID WC  . lidocaine-prilocaine  1 application Topical Once per day on Mon Wed Fri  . multivitamin  1 tablet Oral QHS  . pantoprazole  40 mg Oral Daily  . predniSONE  5 mg Oral Q breakfast  . sevelamer carbonate  800 mg Oral TID WC   Continuous Infusions: . [START ON 11/04/2020] sodium chloride    . [START ON 11/04/2020] sodium chloride    . heparin    . heparin    . magnesium sulfate bolus IVPB       LOS: 4 days   Cherene Altes, MD [facility] Office  [phone] Pager - Text Page per Amion  If 7PM-7AM, please contact night-coverage per Amion 11/03/2020, 10:50 AM

## 2020-11-03 NOTE — TOC Initial Note (Signed)
Transition of Care (TOC) - Initial/Assessment Note    Patient Details  Name: Jonathan Stidd Sr. MRN: [id number] Date of Birth: 01-Sep-1950  Transition of Care [facility]) CM/SW Contact:    Bethann Berkshire, [place] Phone Number: 11/03/2020, 4:44 PM  Clinical Narrative:                  CSW met with pt and pt wife bedside. Pt is only oriented to self. CSW explained SNF recommendation to wife. Wife expressed desire for [facility]. CSW explained that PT is recommending SNF instead of CIR at this time. Pt lives at home with wife. She says she has been seeking more appropriate housing for pt's limited mobility since they are currently in a home with stairs but she has been unable to do so. She explains that pt has equipment including walker, rollator, 3 in 1, cane. She things he will need a hospital bed once returning home. CSW explained that SNF social worker would arrange that. Wife states that pt has been to [facility] in the past, most recently 1 year ago. She is agreeable to SNF workup with preference of [facility] since it is closer to their home. CSW completed FL2 and faxed bed requests in the hub.   Expected Discharge Plan: Skilled Nursing Facility Barriers to Discharge: Continued Medical Work up   Patient Goals and CMS Choice   CMS Medicare.gov Compare Post Acute Care list provided to:: Patient Represenative (must comment) (Wife) Choice offered to / list presented to : Spouse  Expected Discharge Plan and Services Expected Discharge Plan: [place]       Living arrangements for the past 2 months: Single Family Home                                      Prior Living Arrangements/Services Living arrangements for the past 2 months: Single Family Home Lives with:: Spouse                   Activities of Daily Living Home Assistive Devices/Equipment: CBG Meter,Cane (specify quad or straight),Walker (specify type),Wheelchair ADL Screening (condition at  time of admission) Patient's cognitive ability adequate to safely complete daily activities?: Yes Is the patient deaf or have difficulty hearing?: No Does the patient have difficulty seeing, even when wearing glasses/contacts?: No Does the patient have difficulty concentrating, remembering, or making decisions?: No Patient able to express need for assistance with ADLs?: Yes Does the patient have difficulty dressing or bathing?: No Independently performs ADLs?: Yes (appropriate for developmental age) Does the patient have difficulty walking or climbing stairs?: No Weakness of Legs: Right Weakness of Arms/Hands: None  Permission Sought/Granted                  Emotional Assessment       Orientation: : Oriented to Self Alcohol / Substance Use: Not Applicable Psych Involvement: No (comment)  Admission diagnosis:  Osteomyelitis ([place]) [M86.9] Osteomyelitis, unspecified site, unspecified type [facility]) [M86.9] Patient Active Problem List   Diagnosis Date Noted  . Osteomyelitis ([place]) 10/30/2020  . Encounter for general adult medical examination without abnormal findings 09/15/2020  . Hemiplegia of dominant side ([place]) 09/15/2020  . History of cerebrovascular accident 09/15/2020  . History of COVID-19 09/15/2020  . Hypercoagulable state ([place]) 09/15/2020  . Other chronic osteomyelitis, right ankle and foot ([place]) 09/15/2020  . Right flank pain 09/15/2020  .  Atherosclerosis of native arteries of the extremities with ulceration ([place]) 08/05/2020  . Encounter for removal of sutures 07/27/2020  . Neuropathic ulcer of foot, unspecified laterality, limited to breakdown of skin ([place]) 06/09/2020  . Anemia 01/16/2020  . Headache, unspecified 12/03/2019  . Other disorders of phosphorus metabolism 10/25/2019  . Unspecified protein-calorie malnutrition ([place]) 09/27/2019  . FUO (fever of unknown origin) 09/17/2019  . Cerebral thrombosis with cerebral infarction 08/02/2019  . Cerebral embolism with  cerebral infarction 08/02/2019  . Acute respiratory failure due to COVID-19 ([place]) 07/30/2019  . Acute respiratory failure with hypoxia ([place]) 07/30/2019  . Allergy, unspecified, initial encounter 05/27/2019  . Chronic systolic HF (heart failure) ([place]) 02/25/2019  . ESRD (end stage renal disease) on dialysis ([place]) 02/25/2019  . 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 kidney disease, or end stage renal disease ([place]) 12/04/2018  . Hypoglycemia, unspecified 08/17/2018  . Generalized abdominal pain 07/16/2018  . Hypercalcemia 06/28/2018  . Arteriovenous fistula for hemodialysis in place, primary ([place]) 05/15/2018  . 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 Anemia in chronic kidney disease 05/13/2018  . Idiopathic gout, unspecified site 01/24/2018  . Complication of vascular dialysis catheter 01/16/2018  . Other specified coagulation defects ([place]) 01/16/2018  . Pain, unspecified 01/16/2018  . Pruritus, unspecified 01/16/2018  . Secondary hyperparathyroidism of renal origin ([place]) 01/16/2018  . Shortness of breath 01/16/2018  . Type 2 diabetes mellitus with diabetic peripheral angiopathy without gangrene ([place]) 01/16/2018  . Abnormal gait 01/10/2018  . Dependence on renal dialysis ([place]) 01/10/2018  . Iron deficiency anemia, unspecified 12/21/2017  . Acute blood loss anemia 12/14/2017  . Hypomagnesemia 12/03/2017  . Anticoagulated 12/02/2017  . Stroke ([place]) 11/29/2017  . 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 History of MI (myocardial infarction) 11/28/2017  . Hypertension 11/28/2017  . Chronic hepatitis C without hepatic coma ([place]) 09/25/2017  . Diabetes mellitus type 2, uncomplicated ([place]) 09/47/0962  . Myocardial infarction ([place]) 09/25/2017  . Renal transplant recipient 09/25/2017  . Transfusion history 09/25/2017  . Vascular dementia without behavioral disturbance ([place]) 03/10/2017  . History of coronary  artery disease 09/12/2016  . 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 MI ([place]) 09/12/2016  . Acute coronary thrombosis not resulting in myocardial infarction ([place]) 09/12/2016  . Aftercare following organ transplant 09/11/2016  . 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 Hyperlipidemia 09/01/2016  . Long term (current) use of anticoagulants 09/01/2016  . 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 At risk for infection transmitted from donor 06/16/2014  . Ureteral stent displacement ([place]) 06/10/2014  . Prophylactic antibiotic 06/09/2014  . Immunosuppression ([place]) 06/06/2014   PCP:  Ginger Organ., MD Pharmacy:   [facility] [place], [place] - [place] [address] [place] [address] Phone: [phone] Fax: [phone]  [facility] [place], [place] - [address] [place] [address] Phone: [phone] Fax: [phone]     Social Determinants of Health (SDOH) Interventions    Readmission Risk Interventions Readmission Risk Prevention Plan 08/10/2020  Transportation Screening Complete  Medication Review ([place]) Complete  PCP or Specialist appointment within 3-5 days of discharge Complete  HRI or [place] Complete  SW Recovery Care/Counseling Consult Complete  [place] Not Applicable  Some recent data  might be hidden

## 2020-11-03 NOTE — Progress Notes (Signed)
Subjective: Seen on hemodialysis no complaints, HD today not yesterday secondary patient load, lack of HD RNs  Objective Vital signs in last 24 hours: Vitals:   11/03/20 1130 11/03/20 1140 11/03/20 1154 11/03/20 1238  BP: 124/65 125/65 (!) 144/69 126/69  Pulse: 76 76 78 78  Resp: '16 16 16   '$ Temp:  98 F (36.7 C)  98.6 F (37 C)  TempSrc:  Oral  Oral  SpO2:  100%  100%  Weight:  61 kg    Height:       Weight change:   Physical Exam General: Alert elderly male, nad Heart:RRR no MRG Lungs:Clear bilaterally nonlabored Abdomen:soft non-tender, nondistended Extremities:No sig LE edema: R BKA bandage dry, clean Dialysis Access:RUE AVF +bruit   Dialysis Orders: [id number] 76mn 400/500 63kg 2/2 bath AVF Hep 3000 Mircera 75 q 2 wks (last 3/16)  Calcitriol 1.75 tiw  Problem/Plan: 1. R foot osteo --Empiric antibioticsper primary. VVSfollowing -SP  BKAthis a.m.  3/28  2. PVD -- s/p R SFA stenting, RCFAendarterectomy. On Plavix.  3. ESRD -HD MWF.K + 5.5,HD today off schedule today secondary yesterday patient load lack of HD RNs back on schedule tomorrow as able 4. PAFib-on [place]. [place]. Per pharmacy, sinus on exam 5. Hypertension/volume - BP ok, anticipate lower dry weight with R BKA. UFas tolerated 6. Anemia - Hgb11.4> 9.5>9.3> 8.1On ESAas outpatient restart in hospital, due next treatment 3/30 Aranesp 100 7. Metabolic bone disease -Corr Ca elevated/Phos ok.Continue binders.Hold calcitriol.   DErnest Haber PA-C [facility] [phone]/29/2022,12:58 PM  LOS: 4 days   Labs: Basic Metabolic Panel: Recent Labs  Lab 10/31/20 0258 11/01/20 0105 11/03/20 0304  NA 138 136 133*  K 4.6 5.0 5.5*  CL 96* 97* 97*  CO2 '27 30 23  '$ GLUCOSE 95 171* 140*  BUN 61* 32* 65*  CREATININE 8.74* 5.51* 8.35*  CALCIUM 9.8 9.2 9.2  PHOS  --  3.7  --    Liver Function Tests: Recent Labs  Lab  10/30/20 1001 11/01/20 0105  AST 12*  --   ALT 10  --   ALKPHOS 49  --   BILITOT 0.6  --   PROT 6.6  --   ALBUMIN 3.1* 2.8*   No results for input(s): LIPASE, AMYLASE in the last 168 hours. No results for input(s): AMMONIA in the last 168 hours. CBC: Recent Labs  Lab 10/30/20 1001 10/31/20 0258 10/31/20 2038 11/01/20 0105 11/01/20 1445 11/03/20 0304  WBC 11.9* 11.8* 10.2 10.0 8.1 12.1*  NEUTROABS 9.5*  --   --   --   --   --   HGB 10.3* 10.6* 11.4* 9.5* 9.3* 8.1*  HCT 33.6* 33.5* 36.3* 29.3* 30.0* 25.4*  MCV 97.7 93.1 94.0 92.4 94.9 91.4  PLT 255 261 256 247 228 219   Cardiac Enzymes: No results for input(s): CKTOTAL, CKMB, CKMBINDEX, TROPONINI in the last 168 hours. CBG: Recent Labs  Lab 11/02/20 0950 11/02/20 1202 11/02/20 1655 11/02/20 1948 11/03/20 1245  GLUCAP 76 152* 316* 237* 81    Studies/Results: No results found. Medications: . heparin    . heparin 950 Units/hr (11/03/20 1250)   . allopurinol  100 mg Oral Daily  . atorvastatin  40 mg Oral Daily  . Chlorhexidine Gluconate Cloth  6 each Topical Q0600  . [START ON 11/04/2020] darbepoetin (ARANESP) injection - DIALYSIS  100 mcg Intravenous Q Wed-HD  . docusate sodium  100 mg Oral Daily  . HYDROmorphone      .  insulin aspart  0-6 Units Subcutaneous TID WC  . insulin glargine  8 Units Subcutaneous Daily  . lidocaine-prilocaine  1 application Topical Once per day on Mon Wed Fri  . multivitamin  1 tablet Oral QHS  . pantoprazole  40 mg Oral Daily  . predniSONE  5 mg Oral Q breakfast  . sevelamer carbonate  800 mg Oral TID WC  . warfarin  6 mg Oral ONCE-1600  . Warfarin - Pharmacist Dosing Inpatient   Does not apply [id number]

## 2020-11-03 NOTE — Progress Notes (Signed)
ANTICOAGULATION CONSULT NOTE - Follow Up Consult  Pharmacy Consult for Heparin Indication: h/o afib/CVA;, warfarin on hold  No Known Allergies  Patient Measurements: Height: 6' (182.9 cm) Weight: 62.8 kg (138 lb 7.2 oz) IBW/kg (Calculated) : 77.6 Heparin Dosing Weight: 62.8 kg  Vital Signs: Temp: 98.2 F (36.8 C) (03/29 0730) Temp Source: Oral (03/29 0730) BP: 147/76 (03/29 0735) Pulse Rate: 82 (03/29 0735)  Labs: Recent Labs    11/01/20 0105 11/01/20 1445 11/03/20 0304 11/03/20 0620  HGB 9.5* 9.3* 8.1*  --   HCT 29.3* 30.0* 25.4*  --   PLT 247 228 219  --   LABPROT 25.2* 22.0*  --  17.2*  INR 2.4* 2.0*  --  1.5*  CREATININE 5.51*  --  8.35*  --     Estimated Creatinine Clearance: 7.6 mL/min (A) (by C-G formula based on SCr of 8.35 mg/dL (H)).   Assessment: Anticoag: Hep whenINR <2 for h/o afib/CVA; warfarin PTA on hold for R BKA 3/28. CHADS2VASC 7.Hgb down to 8.1 post-op. INR down to 1.5. Start IV heparin for bridging 3/29. HD RN would like to wait to start IV heparin until HD completed around 1130 due to significant access bleeding with last HD session. - 1 mg Vitamin K 3/25, 2 units FFP - Home dose 6 mg daily except for 9 mg on Tues, Thurs, Sun.   Goal of Therapy:  Heparin level 0.3-0.7 units/ml Monitor platelets by anticoagulation protocol: Yes   Plan:  Ok via chat to resume IV heparin today by Dr. Stanford Breed on 3/28 Start IV heparin (no bolus) at 950 units/hr to start after HD. No extra heparin with HD today. Daily HL and CBC   Nurah Petrides S. Alford Highland, PharmD, BCPS Clinical Staff Pharmacist [email or web address] Alford Highland, Jalena Vanderlinden Stillinger 11/03/2020,8:04 AM

## 2020-11-03 NOTE — Progress Notes (Signed)
Orthopedic Tech Progress Note Patient Details:  Keenen Hickling Sr. Jul 06, 1951 [id number]  Ortho Devices Type of Ortho Device: Knee Immobilizer Ortho Device/Splint Location: rle Ortho Device/Splint Interventions: Ordered,Application,Adjustment   Post Interventions Patient Tolerated: Fair Instructions Provided: Care of device,Adjustment of device,Poper ambulation with device   Keylee Shrestha 11/03/2020, 6:29 PM

## 2020-11-03 NOTE — Progress Notes (Signed)
ANTICOAGULATION CONSULT NOTE - Follow Up Consult  Pharmacy Consult for warfarin Indication: h/o afib/CVA  No Known Allergies  Patient Measurements: Height: 6' (182.9 cm) Weight: 61 kg (134 lb 7.7 oz) IBW/kg (Calculated) : 77.6 Heparin Dosing Weight: 62.8 kg  Vital Signs: Temp: 98.3 F (36.8 C) (03/29 1500) Temp Source: Oral (03/29 1500) BP: 124/54 (03/29 1500) Pulse Rate: 87 (03/29 1500)  Labs: Recent Labs    11/01/20 0105 11/01/20 1445 11/03/20 0304 11/03/20 0620 11/03/20 1809  HGB 9.5* 9.3* 8.1*  --   --   HCT 29.3* 30.0* 25.4*  --   --   PLT 247 228 219  --   --   LABPROT 25.2* 22.0*  --  17.2*  --   INR 2.4* 2.0*  --  1.5*  --   HEPARINUNFRC  --   --   --   --  <0.10*  CREATININE 5.51*  --  8.35*  --   --     Estimated Creatinine Clearance: 7.4 mL/min (A) (by C-G formula based on SCr of 8.35 mg/dL (H)).   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 CHADS2VASC 7.Hgb down to 8.1 post-op. INR down to 1.5. Pharmacy consulted to resume warfarin per primary.  IV heparin for bridging started on 3/29. HD RN would like to wait to start IV heparin until HD completed around 1130 due to significant access bleeding with last HD session. Heparin drip 950 uts/hr HL < 0.1 subtherapeutic   Admission INR 5.4>>6.1. INR today 1.5 - 1 mg Vitamin K 3/25, '5mg'$  on 3/27, 2 units FFP - Home dose 6 mg daily except for 9 mg on Tues, Thurs, Sun.   Goal of Therapy:  INR 2-3 Monitor platelets by anticoagulation protocol: Yes   Plan:  Increase  IV heparin (no bolus) 1150 units/hr Daily HL and CBC   Bonnita Nasuti Pharm.D. CPP, BCPS Clinical Pharmacist [phone] 11/03/2020 7:12 PM    Please utilize Amion for appropriate phone number to reach the unit pharmacist ([place])   11/03/2020,7:10 PM

## 2020-11-03 NOTE — Progress Notes (Signed)
ANTICOAGULATION CONSULT NOTE - Follow Up Consult  Pharmacy Consult for warfarin Indication: h/o afib/CVA  No Known Allergies  Patient Measurements: Height: 6' (182.9 cm) Weight: 61 kg (134 lb 7.7 oz) IBW/kg (Calculated) : 77.6 Heparin Dosing Weight: 62.8 kg  Vital Signs: Temp: 98 F (36.7 C) (03/29 1140) Temp Source: Oral (03/29 1140) BP: 144/69 (03/29 1154) Pulse Rate: 78 (03/29 1154)  Labs: Recent Labs    11/01/20 0105 11/01/20 1445 11/03/20 0304 11/03/20 0620  HGB 9.5* 9.3* 8.1*  --   HCT 29.3* 30.0* 25.4*  --   PLT 247 228 219  --   LABPROT 25.2* 22.0*  --  17.2*  INR 2.4* 2.0*  --  1.5*  CREATININE 5.51*  --  8.35*  --     Estimated Creatinine Clearance: 7.4 mL/min (A) (by C-G formula based on SCr of 8.35 mg/dL (H)).   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 CHADS2VASC 7.Hgb down to 8.1 post-op. INR down to 1.5. Pharmacy consulted to resume warfarin per primary.  IV heparin for bridging started on 3/29. HD RN would like to wait to start IV heparin until HD completed around 1130 due to significant access bleeding with last HD session.  Admission INR 5.4>>6.1. INR today 1.5 - 1 mg Vitamin K 3/25, '5mg'$  on 3/27, 2 units FFP - Home dose 6 mg daily except for 9 mg on Tues, Thurs, Sun.   Goal of Therapy:  INR 2-3 Monitor platelets by anticoagulation protocol: Yes   Plan:  Warfarin '6mg'$  Po x 1 Daily INR Start IV heparin (no bolus) at 950 units/hr to start after HD. No extra heparin with HD today. Daily HL and CBC   Koen Antilla A. Levada Dy, PharmD, BCPS, FNKF Clinical Pharmacist [place] Please utilize Amion for appropriate phone number to reach the unit pharmacist ([place])   11/03/2020,12:18 PM

## 2020-11-03 NOTE — Procedures (Signed)
I was present at this dialysis session. I have reviewed the session itself and made appropriate changes.   Filed Weights   11/01/20 1112 11/03/20 0730 11/03/20 1140  Weight: 65 kg 62.8 kg 61 kg    Recent Labs  Lab 11/01/20 0105 11/03/20 0304  NA 136 133*  K 5.0 5.5*  CL 97* 97*  CO2 30 23  GLUCOSE 171* 140*  BUN 32* 65*  CREATININE 5.51* 8.35*  CALCIUM 9.2 9.2  PHOS 3.7  --     Recent Labs  Lab 10/30/20 1001 10/31/20 0258 11/01/20 0105 11/01/20 1445 11/03/20 0304  WBC 11.9*   < > 10.0 8.1 12.1*  NEUTROABS 9.5*  --   --   --   --   HGB 10.3*   < > 9.5* 9.3* 8.1*  HCT 33.6*   < > 29.3* 30.0* 25.4*  MCV 97.7   < > 92.4 94.9 91.4  PLT 255   < > 247 228 219   < > = values in this interval not displayed.    Scheduled Meds: . allopurinol  100 mg Oral Daily  . atorvastatin  40 mg Oral Daily  . Chlorhexidine Gluconate Cloth  6 each Topical Q0600  . [START ON 11/04/2020] darbepoetin (ARANESP) injection - DIALYSIS  100 mcg Intravenous Q Wed-HD  . docusate sodium  100 mg Oral Daily  . HYDROmorphone      . insulin aspart  0-6 Units Subcutaneous TID WC  . insulin glargine  8 Units Subcutaneous Daily  . lidocaine-prilocaine  1 application Topical Once per day on Mon Wed Fri  . multivitamin  1 tablet Oral QHS  . pantoprazole  40 mg Oral Daily  . predniSONE  5 mg Oral Q breakfast  . sevelamer carbonate  800 mg Oral TID WC  . warfarin  6 mg Oral ONCE-1600  . Warfarin - Pharmacist Dosing Inpatient   Does not apply q1600   Continuous Infusions: . heparin    . heparin     PRN Meds:.acetaminophen, diphenhydrAMINE, feeding supplement, guaiFENesin-dextromethorphan, hydrALAZINE, HYDROmorphone (DILAUDID) injection, labetalol, levalbuterol, metoprolol tartrate, ondansetron, oxyCODONE-acetaminophen, phenol, senna-docusate   Santiago Bumpers,  MD 11/03/2020, 12:29 PM

## 2020-11-03 NOTE — Plan of Care (Signed)
  Problem: Safety: Goal: Ability to remain free from injury will improve Outcome: Progressing   Problem: Pain Managment: Goal: General experience of comfort will improve Outcome: Progressing   

## 2020-11-03 NOTE — Progress Notes (Addendum)
Occupational Therapy Evaluation  PTA pt lives at home with his wife and daughter and has assistance for ADL tasks. Wife states pt was able to ambulate independently with use of AD. Session limited by apparent agitation at times. PT discussed with wife over the phone who plans to be present for future session to improve pt participation. Pt overall Max A with ADL tasks at this time due to deficits listed below and will benefit from rehab at [facility]. Will follow acutely. Discussed possible use of limb protector splint for RLE due to risk for knee flexion contracture with PA, who plans to order splint.    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 PMH: PVD whith chrnoic R foot nonhealing wound, s/p recent R CFA stending 09/2020, IDDM, ESRD on HD MWB, HTN, stroke PSH: R 5th toe amp, abdominal aortogram with LE  Precautions  Precautions Fall  Precaution Comments R BKA; at risk for contracture  Restrictions  Weight Bearing Restrictions Yes  RLE Weight Bearing NWB  Home Living  Family/patient expects to be discharged to: Private residence  Living Arrangements Spouse/significant other;Children (son and dtr in [employer])  Available Help at Discharge Family;Available 24 hours/day  Type of Home House  Home Access Stairs to enter  Entrance Stairs-Number of Steps 1  Home Layout Two level;Bed/bath upstairs;1/2 bath on main level  Alternate Level Stairs-Number of Steps 12  Alternate Level Stairs-Rails Right  Bathroom Shower/Tub Tub/shower unit  Tax adviser - 2 wheels;Walker - 4 wheels;Cane - single point   Additional Comments history provided by wife  Prior Function  Level of Independence Needs assistance  Gait / Transfers Assistance Needed uses cane, walker  ADL's / Homemaking Assistance Needed wife does bathing and dressing  Communication / Swallowing Assistance Needed doesn't initiate conversation, word finding difficulty, doesn't know date typically  Communication  Communication Expressive difficulties  Pain Assessment  Pain Assessment Faces  Faces Pain Scale 6  Pain Location pt states no pain when asked however grimaces, moans, yells, and reaches for R LE when moving, became agitated when PT/OT attempted to assist with it  Pain Descriptors / Indicators Discomfort;Grimacing;Guarding  Pain Intervention(s) Limited activity within patient's tolerance  Cognition  Arousal/Alertness Awake/alert  Behavior During Therapy Anxious  Overall Cognitive Status No family/caregiver present to determine baseline cognitive functioning  Area of Impairment Orientation;Following commands;Safety/judgement;Problem solving;Attention;Awareness  Orientation Level Disoriented to;Time (per spouse, pt doesn't typical know the date)  Current Attention Level Sustained  Following Commands Follows one step commands inconsistently;Follows one step commands with increased time  Safety/Judgement Decreased awareness of safety;Decreased awareness of deficits  Awareness Intellectual  Problem Solving Slow processing;Decreased initiation;Difficulty sequencing;Requires verbal cues;Requires tactile cues  General Comments pt with word finding difficulty, inability to hold a converstation, difficulty following commands, easily agitated when PT/OT would offer to assist screaming "NO", states surgery on my leg but not what kind, pt perseverating on september  Upper Extremity Assessment  Upper Extremity Assessment Generalized weakness (unsure of baseline; incoordination present however pt using BUE functionally)  Lower Extremity  Assessment  Lower Extremity Assessment Defer to PT evaluation (appeasr to prefer knee in flexed posture)  Cervical /  Trunk Assessment  Cervical / Trunk Assessment Other exceptions (will further assess; appears to perfer R side)  ADL  Overall ADL's  Needs assistance/impaired  Eating/Feeding Minimal assistance  Grooming Minimal assistance;Sitting  Upper Body Bathing Moderate assistance;Bed level  Lower Body Bathing Maximal assistance;Bed level  Upper Body Dressing  Moderate assistance;Sitting;Bed level  Lower Body Dressing Maximal assistance;Bed level  Functional mobility during ADLs  (unable to complete at this time)  General ADL Comments appears agitated at times limiting ability to participate  Vision- Assessment  Additional Comments will further assess  Bed Mobility  Overal bed mobility Needs Assistance  Bed Mobility Rolling;Sidelying to Sit;Sit to Supine  Rolling Min assist;Mod assist  Sidelying to sit  (attempted to help but pt became agitated and wouldnt let therapists assist however was unable to complete on own)  Sit to supine Mod assist;+2 for physical assistance (to scoot up to [facility])  General bed mobility comments pt with inconsistency between verbal report and actions, pt agreeing to get up but when PT/OT would offer to assist when witnessing pt having difficulty, pt would become agitated saying "NO", pt attempted a couple of times and would require assist at trunk however wouldn't allow help  Transfers  General transfer comment unable  Balance  Overall balance assessment Needs assistance  Sitting-balance support Feet supported;Bilateral upper extremity supported  Sitting balance-Leahy Scale Zero  Sitting balance - Comments pt unable to attain sitting EOB  General Comments  General comments (skin integrity, edema, etc.) pt with ace wrap on R LE  OT - End of Session  Activity Tolerance Treatment limited secondary to agitation (Pt discussed with wife who plans to be  present for future sessions to improve participation)  Patient left in bed;with call bell/phone within reach;with bed alarm set  Nurse Communication Mobility status;Weight bearing status;Precautions  OT Assessment  OT Recommendation/Assessment Patient needs continued OT Services  OT Visit Diagnosis Other abnormalities of gait and mobility (R26.89);Muscle weakness (generalized) (M62.81);Other symptoms and signs involving cognitive function;Pain  Pain - Right/Left Right  Pain - part of body Leg  OT Problem List Decreased strength;Decreased range of motion;Decreased activity tolerance;Impaired balance (sitting and/or standing);Decreased coordination;Decreased cognition;Decreased safety awareness;Decreased knowledge of use of DME or AE;Decreased knowledge of precautions;Impaired sensation;Pain  OT Plan  OT Frequency (ACUTE ONLY) Min 2X/week  OT Treatment/Interventions (ACUTE ONLY) Self-care/ADL training;Therapeutic exercise;Neuromuscular education;DME and/or AE instruction;Splinting;Therapeutic activities;Cognitive remediation/compensation;Patient/family education;Balance training  AM-PAC OT "6 Clicks" Daily Activity Outcome Measure (Version 2)  Help from another person eating meals? 3  Help from another person taking care of personal grooming? 3  Help from another person toileting, which includes using toliet, bedpan, or urinal? 2  Help from another person bathing (including washing, rinsing, drying)? 2  Help from another person to put on and taking off regular upper body clothing? 2  Help from another person to put on and taking off regular lower body clothing? 2  6 Click Score 14  OT Recommendation  Follow Up Recommendations SNF;Supervision/Assistance - 24 hour  OT Equipment 3 in 1 bedside commode  Individuals Consulted  Consulted and Agree with Results and Recommendations Patient unable/family or caregiver not available (PT discussed with wife)  Acute Rehab OT Goals  Patient Stated Goal  didn't state  OT Goal Formulation Patient unable to participate in goal setting  Time For Goal Achievement 11/17/20  Potential to Achieve Goals Fair  OT Time Calculation  OT Start Time (ACUTE ONLY) 1301  OT Stop Time (ACUTE ONLY) 1320  OT Time Calculation (  min) 19 min  OT General Charges  $OT Visit 1 Visit  OT Evaluation  $OT Eval Moderate Complexity 1 Mod  Written Expression  Dominant Hand Right  Maurie Boettcher, OT/L   Acute OT Clinical Specialist Acute Rehabilitation Services Pager [phone] Office [phone]

## 2020-11-03 NOTE — Progress Notes (Signed)
Hemodialysis- Treatment initiated via RUA with 15g needles x2. No heparin tx. Patient does have intermittent pain but is refusing medication. Alert to self and place, appears to be confused/slow to respond at times. Continue to monitor.

## 2020-11-03 NOTE — Hospital Course (Signed)
Subjective: Seen on dialysis no complaints, noted dialysis off schedule yesterday not available d/t patient load ,lack of HD RN  Objective Vital signs in last 24 hours: Vitals:   11/03/20 1100 11/03/20 1130 11/03/20 1140 11/03/20 1154  BP: 116/72 124/65 125/65 (!) 144/69  Pulse: 78 76 76 78  Resp: '15 16 16 16  '$ Temp:   98 F (36.7 C)   TempSrc:   Oral   SpO2:   100%   Weight:   61 kg   Height:       Weight change:   Physical Exam General:  Alert elderly male, nad  Heart: RRR  no MRG Lungs: Clear bilaterally nonlabored  Abdomen: soft non-tender, nondistended Extremities: No sig LE edema: R BKA bandage dry, clean Dialysis Access: RUE AVF patent on dialysis   Dialysis Orders:  [facility] MWF 4h 78mn  400/500  63kg  2/2 bath  AVF  Hep 3000 Mircera 75 q 2 wks (last 3/16)  Calcitriol 1.75 tiw    Problem/Plan: R foot osteo --Empiric antibiotics per primary. VVS following -SP  BKA  3/28   PVD -- s/p R SFA stenting, RCFA endarterectomy. On Plavix.  ESRD -  HD MWF.  K + 5.5,HD today off schedule secondary yesterday, patient load lack of HD RN attempted back on schedule tomorrow short dialysis PAFib -on warfarn. Supratherapeutic INR on admission. Per pharmacy, sinus on exam Hypertension/volume  - BP ok, anticipate lower dry weight with R BKA. UF as tolerated  Anemia  - Hgb 11.4> 9.5>9.3 > 8.1 on 3/27  On ESA as outpatient restart in hospital, due next treatment 3/30 Aranesp [id number] bone disease -  Corr Ca elevated/Phos ok. Continue binders. Hold calcitriol  DErnest Haber PA-C [id number]/29/2022,12:08 PM  LOS: 4 days   Labs: Basic Metabolic Panel: Recent Labs  Lab 10/31/20 0258 11/01/20 0105 11/03/20 0304  NA 138 136 133*  K 4.6 5.0 5.5*  CL 96* 97* 97*  CO2 '27 30 23  '$ GLUCOSE 95 171* 140*  BUN 61* 32* 65*  CREATININE 8.74* 5.51* 8.35*  CALCIUM 9.8 9.2 9.2  PHOS  --  3.7  --    Liver Function Tests: Recent Labs  Lab 10/30/20 1001  11/01/20 0105  AST 12*  --   ALT 10  --   ALKPHOS 49  --   BILITOT 0.6  --   PROT 6.6  --   ALBUMIN 3.1* 2.8*   No results for input(s): LIPASE, AMYLASE in the last 168 hours. No results for input(s): AMMONIA in the last 168 hours. CBC: Recent Labs  Lab 10/30/20 1001 10/31/20 0258 10/31/20 2038 11/01/20 0105 11/01/20 1445 11/03/20 0304  WBC 11.9* 11.8* 10.2 10.0 8.1 12.1*  NEUTROABS 9.5*  --   --   --   --   --   HGB 10.3* 10.6* 11.4* 9.5* 9.3* 8.1*  HCT 33.6* 33.5* 36.3* 29.3* 30.0* 25.4*  MCV 97.7 93.1 94.0 92.4 94.9 91.4  PLT 255 261 256 247 228 219   Cardiac Enzymes: No results for input(s): CKTOTAL, CKMB, CKMBINDEX, TROPONINI in the last 168 hours. CBG: Recent Labs  Lab 11/02/20 0754 11/02/20 0950 11/02/20 1202 11/02/20 1655 11/02/20 1948  GLUCAP 74 76 152* 316* 237*    Studies/Results: No results found. Medications:  [START ON 11/04/2020] sodium chloride     [START ON 11/04/2020] sodium chloride     heparin     heparin      allopurinol  100 mg  Oral Daily   atorvastatin  40 mg Oral Daily   Chlorhexidine Gluconate Cloth  6 each Topical Q0600   [START ON 11/04/2020] darbepoetin (ARANESP) injection - DIALYSIS  100 mcg Intravenous Q Wed-HD   docusate sodium  100 mg Oral Daily   HYDROmorphone       insulin aspart  0-6 Units Subcutaneous TID WC   insulin glargine  8 Units Subcutaneous Daily   lidocaine-prilocaine  1 application Topical Once per day on Mon Wed Fri   multivitamin  1 tablet Oral QHS   pantoprazole  40 mg Oral Daily   predniSONE  5 mg Oral Q breakfast   sevelamer carbonate  800 mg Oral TID WC

## 2020-11-03 NOTE — Evaluation (Signed)
Physical Therapy Evaluation Patient Details Name: Jonathan Seats Sr. 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 Pt also became agitated with PT/OT when attempting to assist pt, ultimately not allowing therapist to assist pt into sitting EOB. Despite pt stating "no" to pain when asked, during mobility pt reaching for R LE. Spoke extensively with spouse and discussed ST-SNF upon d/c to allow for increased time for patient to achieve safe supervision level of function at w/c level. Pt spouse also reports they're in the process of moving to a more accessible home. Pt's spouse would like ot be at next session in hops to aide in calming pt down so the patient will participate better. Acute PT to cont to follow.    Follow Up Recommendations SNF;Supervision/Assistance - 24 hour    Equipment Recommendations   (TBD at next venue)    Recommendations for Other Services       Precautions / Restrictions Precautions Precautions: Fall Restrictions Weight Bearing Restrictions: Yes RLE Weight Bearing: Non weight bearing      Mobility  Bed Mobility Overal bed mobility: Needs Assistance Bed Mobility: Rolling;Sidelying to Sit;Sit to Supine Rolling: Min assist;Mod assist Sidelying to sit:  (attempted to help but pt became agitated and wouldnt let therapists assist however was unable to complete on own)   Sit to supine: Mod assist;+2 for physical assistance (to scoot up to [facility])   General bed mobility comments: pt with inconsistency  between verbal report and actions, pt agreeing to get up but when PT/OT would offer to assist when witnessing pt having difficulty, pt would become agitated saying "NO", pt attempted a couple of times and would require assist at trunk however wouldn't allow help    Transfers                 General transfer comment: unable  Ambulation/Gait             General Gait Details: unable  Stairs            Wheelchair Mobility    Modified Rankin (Stroke Patients Only)       Balance Overall balance assessment: Needs assistance Sitting-balance support: Feet supported;Bilateral upper extremity supported Sitting balance-Leahy Scale: Zero Sitting balance - Comments: pt unable to attain sitting EOB                                     Pertinent Vitals/Pain Pain Assessment: Faces Faces Pain Scale: Hurts even more Pain Location: pt states no pain when asked however grimaces, moans, yells, and reaches for R LE when moving, became agitated when PT/OT attempted to assist with it    Home Living Family/patient expects to be discharged to:: Private residence Living Arrangements: Spouse/significant other;Children (son and dtr in [employer]) Available Help at Discharge: Family;Available 24 hours/day Type of Home: House Home Access: Stairs to enter   [employer] of Steps: 1 Home Layout: Two level;Bed/bath upstairs;1/2 bath on main level  Home Equipment: [employer] - 2 wheels;Walker - 4 wheels;Cane - single point Additional Comments: history provided by wife    Prior Function Level of Independence: Needs assistance   Gait / Transfers Assistance Needed: uses cane, walker  ADL's / Homemaking Assistance Needed: wife does bathing and dressing        Hand Dominance   Dominant Hand: Right    Extremity/Trunk Assessment   Upper Extremity Assessment Upper Extremity Assessment: Defer to OT evaluation    Lower Extremity Assessment Lower Extremity Assessment:  RLE deficits/detail;LLE deficits/detail RLE Deficits / Details: BKA, able to bring knee towards chest, some knee extension voluntarily but nothing to command LLE Deficits / Details: pt moving it voluntarily however unable to follow commands to complete MMT    Cervical / Trunk Assessment Cervical / Trunk Assessment: Normal  Communication   Communication: Expressive difficulties  Cognition Arousal/Alertness: Awake/alert Behavior During Therapy: Anxious Overall Cognitive Status: Impaired/Different from baseline Area of Impairment: Orientation;Following commands;Safety/judgement;Problem solving                 Orientation Level: Disoriented to;Time (per spouse, pt doesn't typical know the date)     Following Commands: Follows one step commands inconsistently;Follows one step commands with increased time Safety/Judgement: Decreased awareness of safety;Decreased awareness of deficits   Problem Solving: Slow processing;Decreased initiation;Difficulty sequencing;Requires verbal cues;Requires tactile cues General Comments: pt with word finding difficulty, inability to hold a converstation, difficulty following commands, easily agitated when PT/OT would offer to assist screaming "NO", states surgery on my leg but not what kind, pt perseverating on september      General Comments General comments (skin integrity, edema, etc.): pt with ace wrap on R LE    Exercises     Assessment/Plan    PT Assessment Patient needs continued PT services  PT Problem List Decreased strength;Decreased range of motion;Decreased activity tolerance;Decreased balance;Decreased mobility;Decreased knowledge of use of DME;Decreased safety awareness;Decreased cognition       PT Treatment Interventions DME instruction;Gait training;Stair training;Functional mobility training;Therapeutic activities;Therapeutic exercise    PT Goals (Current goals can be found in the Care Plan section)  Acute Rehab PT  Goals Patient Stated Goal: didn't state PT Goal Formulation: With family Time For Goal Achievement: 11/17/20 Potential to Achieve Goals: Fair    Frequency Min 3X/week   Barriers to discharge Decreased caregiver support;Inaccessible home environment pt has a flight of stairs to access shower/bedroom, doesn't have guarenteed 24/7 supervision    Co-evaluation PT/OT/SLP Co-Evaluation/Treatment: Yes Reason for Co-Treatment: Necessary to address cognition/behavior during functional activity;To address functional/ADL transfers PT goals addressed during session: Mobility/safety with mobility         AM-PAC PT "6 Clicks" Mobility  Outcome Measure Help needed turning from your back to your side while in a flat bed without using bedrails?: A Lot Help needed moving from lying on your back to sitting on the side of a flat bed without using bedrails?: A Lot Help needed moving to and from a bed to a chair (including a wheelchair)?: A Lot Help needed standing up from a chair using your arms (e.g., wheelchair or bedside chair)?: A Lot Help needed to walk in hospital room?: Total Help needed climbing 3-5 steps with a railing? : Total 6 Click Score: 10    End of Session   Activity Tolerance: Treatment limited secondary to agitation Patient left: in bed;with call bell/phone within reach;with bed alarm set Nurse Communication: Mobility status PT Visit Diagnosis: Unsteadiness on feet (R26.81);Muscle weakness (generalized) (M62.81);Difficulty in walking,  not elsewhere classified (R26.2)    Time: 1257-1330 PT Time Calculation (min) (ACUTE ONLY): 33 min   Charges:   PT Evaluation $PT Eval Moderate Complexity: 1 Mod          Kittie Plater, PT, DPT Acute Rehabilitation Services Pager #: [phone] Office #: [phone]   Berline Lopes 11/03/2020, 2:03 PM

## 2020-11-03 NOTE — Progress Notes (Signed)
PT Cancellation Note  Patient Details Name: Jonathan Hallum Sr. MRN: [id number] DOB: 18-Nov-1950   Cancelled Treatment:    Reason Eval/Treat Not Completed: Patient at procedure or test/unavailable. Pt off floor at HD. PT to return as able to complete PT eval.  Kittie Plater, PT, DPT Acute Rehabilitation Services Pager #: [phone] Office #: [phone]    Jonathan [name] 11/03/2020, 8:05 AM

## 2020-11-03 NOTE — Progress Notes (Signed)
  Progress Note    11/03/2020 7:40 AM 1 Day Post-Op  Subjective:  No complaints. Seen in HD   Vitals:   11/03/20 0034 11/03/20 0430  BP: 134/73 (!) 141/76  Pulse: 84 82  Resp: 15 16  Temp: 98.1 F (36.7 C) 97.9 F (36.6 C)  SpO2: 99% 100%   Physical Exam: Cardiac: regular Lungs: non labored Extremities:  R BKA dressings clean, dry and intact Neurologic: alert and oriented  CBC    Component Value Date/Time   WBC 12.1 (H) 11/03/2020 0304   RBC 2.78 (L) 11/03/2020 0304   HGB 8.1 (L) 11/03/2020 0304   HCT 25.4 (L) 11/03/2020 0304   PLT 219 11/03/2020 0304   MCV 91.4 11/03/2020 0304   MCH 29.1 11/03/2020 0304   MCHC 31.9 11/03/2020 0304   RDW 18.2 (H) 11/03/2020 0304   LYMPHSABS 1.4 10/30/2020 1001   MONOABS 0.9 10/30/2020 1001   EOSABS 0.0 10/30/2020 1001   BASOSABS 0.1 10/30/2020 1001    BMET    Component Value Date/Time   NA 133 (L) 11/03/2020 0304   K 5.5 (H) 11/03/2020 0304   CL 97 (L) 11/03/2020 0304   CO2 23 11/03/2020 0304   GLUCOSE 140 (H) 11/03/2020 0304   BUN 65 (H) 11/03/2020 0304   CREATININE 8.35 (H) 11/03/2020 0304   CALCIUM 9.2 11/03/2020 0304   GFRNONAA 6 (L) 11/03/2020 0304   GFRAA 13 (L) 09/22/2019 0733    INR    Component Value Date/Time   INR 2.0 (H) 11/01/2020 1445     Intake/Output Summary (Last 24 hours) at 11/03/2020 0740 Last data filed at 11/02/2020 2100 Gross per 24 hour  Intake 250 ml  Output 0 ml  Net 250 ml     Assessment/Plan:  70 y.o. male is s/p Right BKA 1 Day Post-Op. Doing well post op. Dressings clean, dry and intact. Dressing take down tomorrow. Medical management per primary team    Karoline Caldwell, [facility] [phone] 11/03/2020 7:40 AM

## 2020-11-03 NOTE — Progress Notes (Signed)
OT Cancellation Note  Patient Details Name: Sandeep Olds Sr. MRN: [id number] DOB: [birth year]   Cancelled Treatment:    Reason Eval/Treat Not Completed: Patient at procedure or test/ unavailable (HD) Will attempt later time.  Barth Trella,HILLARY 11/03/2020, 8:09 AM  Maurie Boettcher, OT/L   Acute OT Clinical Specialist Acute Rehabilitation Services Pager [phone] Office [phone]

## 2020-11-03 NOTE — Progress Notes (Signed)
Hemodialysis- Patient calling out in pain. Discussed pain management. Will medicate

## 2020-11-03 NOTE — Progress Notes (Signed)
Inpatient Rehab Admissions Coordinator:   Consult received.  Awaiting PT/OT recommendations.    Shann Medal, PT, DPT Admissions Coordinator [phone] 11/03/20  1:24 PM

## 2020-11-04 DIAGNOSIS — Z8673 Personal history of transient ischemic attack (TIA), and cerebral infarction without residual deficits: Secondary | ICD-10-CM

## 2020-11-04 DIAGNOSIS — E160 Drug-induced hypoglycemia without coma: Secondary | ICD-10-CM

## 2020-11-04 DIAGNOSIS — N186 End stage renal disease: Secondary | ICD-10-CM

## 2020-11-04 DIAGNOSIS — R791 Abnormal coagulation profile: Secondary | ICD-10-CM

## 2020-11-04 DIAGNOSIS — T383X5A Adverse effect of insulin and oral hypoglycemic [antidiabetic] drugs, initial encounter: Secondary | ICD-10-CM

## 2020-11-04 DIAGNOSIS — E119 Type 2 diabetes mellitus without complications: Secondary | ICD-10-CM

## 2020-11-04 DIAGNOSIS — R627 Adult failure to thrive: Secondary | ICD-10-CM

## 2020-11-04 LAB — CBC
HCT: 25.3 % — ABNORMAL LOW (ref 39.0–52.0)
Hemoglobin: 8.1 g/dL — ABNORMAL LOW (ref 13.0–17.0)
MCH: 29.5 pg (ref 26.0–34.0)
MCHC: 32 g/dL (ref 30.0–36.0)
MCV: 92 fL (ref 80.0–100.0)
Platelets: 223 10*3/uL (ref 150–400)
RBC: 2.75 MIL/uL — ABNORMAL LOW (ref 4.22–5.81)
RDW: 18.5 % — ABNORMAL HIGH (ref 11.5–15.5)
WBC: 9.7 10*3/uL (ref 4.0–10.5)
nRBC: 0 % (ref 0.0–0.2)

## 2020-11-04 LAB — GLUCOSE, CAPILLARY
Glucose-Capillary: 47 mg/dL — ABNORMAL LOW (ref 70–99)
Glucose-Capillary: 51 mg/dL — ABNORMAL LOW (ref 70–99)
Glucose-Capillary: 51 mg/dL — ABNORMAL LOW (ref 70–99)
Glucose-Capillary: 116 mg/dL — ABNORMAL HIGH (ref 70–99)
Glucose-Capillary: 148 mg/dL — ABNORMAL HIGH (ref 70–99)
Glucose-Capillary: 134 mg/dL — ABNORMAL HIGH (ref 70–99)
Glucose-Capillary: 131 mg/dL — ABNORMAL HIGH (ref 70–99)
Glucose-Capillary: 189 mg/dL — ABNORMAL HIGH (ref 70–99)

## 2020-11-04 LAB — BASIC METABOLIC PANEL
Anion gap: 11 (ref 5–15)
BUN: 27 mg/dL — ABNORMAL HIGH (ref 8–23)
CO2: 29 mmol/L (ref 22–32)
Calcium: 8.5 mg/dL — ABNORMAL LOW (ref 8.9–10.3)
Chloride: 96 mmol/L — ABNORMAL LOW (ref 98–111)
Creatinine, Ser: 5 mg/dL — ABNORMAL HIGH (ref 0.61–1.24)
GFR, Estimated: 12 mL/min — ABNORMAL LOW (ref >60)
Glucose, Bld: 30 mg/dL — CL (ref 70–99)
Potassium: 3.9 mmol/L (ref 3.5–5.1)
Sodium: 136 mmol/L (ref 135–145)

## 2020-11-04 LAB — CULTURE, BLOOD (ROUTINE X 2)
Culture: NO GROWTH
Culture: NO GROWTH

## 2020-11-04 LAB — PROTIME-INR
INR: 1.5 — ABNORMAL HIGH (ref 0.8–1.2)
Prothrombin Time: 17.6 seconds — ABNORMAL HIGH (ref 11.4–15.2)

## 2020-11-04 LAB — FOLATE: Folate: 46.9 ng/mL (ref >5.9)

## 2020-11-04 LAB — VITAMIN B12: Vitamin B-12: 488 pg/mL (ref 180–914)

## 2020-11-04 LAB — HEPARIN LEVEL (UNFRACTIONATED): Heparin Unfractionated: 0.24 IU/mL — ABNORMAL LOW (ref 0.30–0.70)

## 2020-11-04 MED ORDER — GLUCOSE 40 % PO GEL
2.0000 | ORAL | Status: AC
  Administered 2020-11-04: 75 g via ORAL
  Filled 2020-11-04: qty 2

## 2020-11-04 MED ORDER — HEPARIN SODIUM (PORCINE) 1000 UNIT/ML DIALYSIS: 3000.0000 [IU] | Freq: Once | INTRAMUSCULAR | Status: DC

## 2020-11-04 MED ORDER — WARFARIN SODIUM 5 MG PO TABS
10.0000 mg | ORAL_TABLET | Freq: Once | ORAL | Status: AC
  Administered 2020-11-04: 10 mg via ORAL
  Filled 2020-11-04: qty 2

## 2020-11-04 MED ORDER — CALCITRIOL 0.5 MCG PO CAPS
1.0000 ug | ORAL_CAPSULE | ORAL | Status: DC
  Filled 2020-11-04 (×2): qty 2

## 2020-11-04 MED ORDER — CALCITRIOL 0.5 MCG PO CAPS
ORAL_CAPSULE | ORAL | Status: AC
  Administered 2020-11-04: 1 ug via ORAL
  Filled 2020-11-04: qty 2

## 2020-11-04 MED ORDER — ASPIRIN 81 MG PO CHEW
81.0000 mg | CHEWABLE_TABLET | Freq: Every day | ORAL | Status: DC
  Administered 2020-11-04 – 2020-11-06 (×3): 81 mg via ORAL
  Filled 2020-11-04 (×3): qty 1

## 2020-11-04 MED ORDER — DARBEPOETIN ALFA 100 MCG/0.5ML IJ SOSY
PREFILLED_SYRINGE | INTRAMUSCULAR | Status: AC
  Administered 2020-11-04: 100 ug via INTRAVENOUS
  Filled 2020-11-04: qty 0.5

## 2020-11-04 MED ORDER — CLOPIDOGREL BISULFATE 75 MG PO TABS
75.0000 mg | ORAL_TABLET | Freq: Every day | ORAL | Status: DC
  Administered 2020-11-05 – 2020-11-06 (×2): 75 mg via ORAL
  Filled 2020-11-04 (×2): qty 1

## 2020-11-04 MED ORDER — DEXTROSE 50 % IV SOLN
INTRAVENOUS | Status: AC
  Filled 2020-11-04: qty 50

## 2020-11-04 NOTE — Progress Notes (Signed)
Inpatient Rehab Admissions Coordinator:   Note therapies both recommending SNF.  Will sign off for CIR at this time.   Shann Medal, PT, DPT Admissions Coordinator [phone] 11/04/20  9:10 AM

## 2020-11-04 NOTE — Progress Notes (Addendum)
ANTICOAGULATION CONSULT NOTE - Follow Up Consult  Pharmacy Consult for Heparin + Coumadin Indication: h/o afib/CVA  No Known Allergies  Patient Measurements: Height: 6' (182.9 cm) Weight: 61 kg (134 lb 7.7 oz) IBW/kg (Calculated) : 77.6 Heparin Dosing Weight: 62.8 kg  Vital Signs: Temp: 98.5 F (36.9 C) (03/30 0700) Temp Source: Oral (03/30 0700) BP: 140/65 (03/30 0700) Pulse Rate: 90 (03/30 0700)  Labs: Recent Labs    11/01/20 1445 11/03/20 0304 11/03/20 0620 11/03/20 1809 11/04/20 0401  HGB 9.3* 8.1*  --   --  8.1*  HCT 30.0* 25.4*  --   --  25.3*  PLT 228 219  --   --  223  LABPROT 22.0*  --  17.2*  --  17.6*  INR 2.0*  --  1.5*  --  1.5*  HEPARINUNFRC  --   --   --  <0.10*  --   CREATININE  --  8.35*  --   --  5.00*    Estimated Creatinine Clearance: 12.4 mL/min (A) (by C-G formula based on SCr of 5 mg/dL (H)).   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 CHADS2VASC 7.Hgb down to 8.1 post-op. INR down to 1.5. Start IV heparin for bridging 3/29.  - AM HL lost tube, STAT redraw, INR 1.5, Hgb 8.1. Plts WNL  - 1 mg Vitamin K 3/25, 2 units FFP - Home dose 6 mg daily except for 9 mg on Tues, Thurs, Sun.  Goal of Therapy:  Heparin level 0.3-0.7 units/ml Monitor platelets by anticoagulation protocol: Yes   Plan:  Con't IV heparin 1150 units/hr until STAT level returns. Coumadin '10mg'$  po x 1 Daily HL and CBC, INR   Adden: HL 0.24 slightly low. Increase heparin to 1250 units/hr. Called HD to ask for rate change to administer Aranesp.  Frieda Arnall S. Alford Highland, PharmD, BCPS Clinical Staff Pharmacist [email or web address] Alford Highland, Nusrat Encarnacion Stillinger 11/04/2020,12:14 PM

## 2020-11-04 NOTE — Progress Notes (Signed)
Physical Therapy Treatment Patient Details Name: Jonathan Heber Sr. 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 Pt at cognitive baseline which is very delayed with minimal comprehension of situation and surrounding. With max verbal cues and maxA pt able to complete lateral scoot to drop arm chair today. Wife given HEP to complete with patient to promote R knee extension. Acute PT to cont to follow.    Follow Up Recommendations  SNF;Supervision/Assistance - 24 hour     Equipment Recommendations       Recommendations for Other Services       Precautions / Restrictions Precautions Precautions: Fall Precaution Comments: R BKA Required Braces or Orthoses: Knee Immobilizer - Right (to prevent R knee flex contracture) Restrictions Weight Bearing Restrictions: Yes RLE Weight Bearing: Non weight bearing    Mobility  Bed Mobility Overal bed mobility: Needs Assistance Bed Mobility: Rolling;Sidelying to Sit Rolling: Min assist;Mod assist Sidelying to sit: Mod assist;+2 for safety/equipment       General bed mobility comments: pt initiated rolling to the R however ultimately requiring modA to achieve sidelying with max encouragement from spouse, maXA for trunk elevation and to pvt hips around, max verbal cues provided by wife    Transfers Overall transfer level: Needs assistance Equipment used:  (used bed pad and gait belts) Transfers: Lateral/Scoot Transfers          Lateral/Scoot Transfers: Mod assist;+2 physical assistance General transfer comment: max directional verbal and tactile cues, modA with use of bed pad to assist with  lateral scoot, pt did use bilat UEs to lift self with max verbal cues provided by spouse, pt c/o "my leg hurts"  Ambulation/Gait             General Gait Details: unable   Stairs             Wheelchair Mobility    Modified Rankin (Stroke Patients Only)       Balance Overall balance assessment: Needs assistance Sitting-balance support: Feet supported;Bilateral upper extremity supported Sitting balance-Leahy Scale: Fair Sitting balance - Comments: pt able to sit EOB and maintain balance with close min guard                                    Cognition Arousal/Alertness: Awake/alert Behavior During Therapy: Anxious Overall Cognitive Status: History of cognitive impairments - at baseline                                 General Comments: pt's spouse reports pt to be a cognitive baseline, very delayed, limited comprehension fo situation/surroundings, responds well to wife who was very helpful during PT sesesion today      Exercises Amputee Exercises Quad Sets: AROM;Right;10 reps;Seated (with LEs in extension, wife present to assist) Knee Flexion: AAROM;Right;10 reps;Seated (with LEs elevated, wife present toa ssist)    General Comments General comments (skin integrity, edema, etc.): pt bleeding from dressing, dressing re-inforced with additional gauze and ace wrap,  RN notified      Pertinent Vitals/Pain Pain Assessment: Faces Faces Pain Scale: Hurts even more Pain Location: reaching for R leg and stating "my leg hurts" with movement Pain Descriptors / Indicators: Discomfort;Grimacing;Guarding Pain Intervention(s): Monitored during session    Home Living                      Prior Function            PT Goals (current goals can now be found in the care plan section) Progress towards PT goals: Progressing toward goals    Frequency    Min 3X/week      PT Plan Current plan remains appropriate    Co-evaluation               AM-PAC PT "6 Clicks" Mobility   Outcome Measure  Help needed turning from your back to your side while in a flat bed without using bedrails?: A Lot Help needed moving from lying on your back to sitting on the side of a flat bed without using bedrails?: A Lot Help needed moving to and from a bed to a chair (including a wheelchair)?: A Lot Help needed standing up from a chair using your arms (e.g., wheelchair or bedside chair)?: A Lot Help needed to walk in hospital room?: Total Help needed climbing 3-5 steps with a railing? : Total 6 Click Score: 10    End of Session Equipment Utilized During Treatment: Right knee immobilizer Activity Tolerance: Patient tolerated treatment well Patient left: in chair;with call bell/phone within reach;with chair alarm set;with family/visitor present Nurse Communication: Mobility status (active bleeding from incision) PT Visit Diagnosis: Unsteadiness on feet (R26.81);Muscle weakness (generalized) (M62.81);Difficulty in walking, not elsewhere classified (R26.2)     Time: [id number] PT Time Calculation (min) (ACUTE ONLY): 20 min  Charges:  $Therapeutic Activity: 8-22 mins                     Kittie Plater, PT, DPT Acute Rehabilitation Services Pager #: [phone] Office #: [phone]    Berline Lopes 11/04/2020, 11:05 AM

## 2020-11-04 NOTE — Progress Notes (Signed)
Hypoglycemic Event  CBG: '47mg'$ /dl Followed by immediate recheck of '51mg'$ /dl .  15 min f/u result of '51mg'$ /dl, further f/u result of '116mg'$ /dl  Treatment: Dextrose 75g oral gel, one can of sprite   Symptoms: none  Follow-up CBG: Time 0708 CBG Result:  '51mg'$ /dl 2nd follow up 0742             CBG result: '116mg'$  /dl    Comments/MD notified: Dr. Erlinda Hong- FYI message sent.     [place] A Matisyn Cabeza

## 2020-11-04 NOTE — Progress Notes (Signed)
PROGRESS NOTE    Jonathan Seaborne Sr.  [id number] DOB: 23-Jul-1951 DOA: 10/30/2020 PCP: Ginger Organ., MD    Chief Complaint  Patient presents with  . 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, remain on heparin drip, Coumadin restarted yesterday Has hypoglycemia event this morning He is drowsy,  he knows he is in the hospital, not able to states the name, not oriented to time, baseline dysarthria, wife states this is he baseline after cva in 07/2019  Assessment & Plan:   Active Problems:   Osteomyelitis ([place])   Right diabetic foot infection with osteomyelitis -He initially received antibiotic Vanco and cefepime -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, wife reports he was on eliquis for his heart, eliquis changed to coumadin after the stroke in 2020, wife is wondering if can change back to eliquis, as patient is having a hard time to have the Coumadin level regulated, defer to outpatient cardiology and neurology -Per chart review patient had stroke in December 2020 during Covid infection, thought stroke due to left ventricular thrombus though TEE was not done due to Covid infection, repeat TEE in 09/2019 did not show thrombus in left ventricle  -on heparin drip  perioperatively, Coumadin resumed yesterday -Monitor INR  CAD s/p prior MI, chronic systolic chf Denies chest pain, on satin Volume managed by dialysis  H/o 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 vs PAF On coumadin, statin  NIDDM2 wife reports patient has been off long-acting insulin at home With hypoglycemic event in hospital Hold Lantus, continue SSI Continue hypoglycemia protocol  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 Management per nephrology Patient reports does not have local nephrologist, wife report patient has been continued on prednisone 5 mg daily PTA because he continued to make some urine Will need to follow-up with nephrology  FTT: will need snf placement     Body mass index is 18.24 kg/m.Marland Kitchen     Unresulted Labs (From admission, onward)          Start     Ordered   11/04/20 0500  Heparin level (unfractionated)  Daily,   R     Question:  Specimen collection method  Answer:  Lab=Lab collect   11/03/20 0811   11/04/20 0500  Vitamin B12  (Anemia Panel (PNL))  Tomorrow morning,   R       Question:  Specimen collection method  Answer:  Lab=Lab collect   11/03/20 1704   11/04/20 0500  Folate  (Anemia Panel (PNL))  Tomorrow morning,   R       Question:  Specimen collection method  Answer:  Lab=Lab collect   11/03/20 1704   11/03/20 [id number]  Basic metabolic panel  Daily,   R     Question:  Specimen collection method  Answer:  Lab=Lab collect   11/02/20 1037   11/03/20 0500  CBC  Daily,   R     Question:  Specimen collection method  Answer:  Lab=Lab collect   11/02/20 1037   11/03/20 0500  Protime-INR  Daily,   R     Question:  Specimen collection method  Answer:  Lab=Lab collect   11/02/20 1632            DVT prophylaxis: SCD's Start: 11/02/20 1037   Code Status: Full Family Communication: wife at bedside  Disposition:   Status is: Inpatient   Dispo: The patient is from: home              Anticipated d/c is to: SNF              Anticipated d/c date is:  when clears by vascular surgery, needs to monitor hgb , INR and blood glucose              possible 24 to 48 hours  Consultants:   Vascular surgery  Nephrology  Procedures:   Right BKA  Hemodialysis  Antimicrobials:    Anti-infectives (From admission, onward)   Start     Dose/Rate Route Frequency Ordered Stop   11/02/20 1200  vancomycin (VANCOREADY) IVPB 750 mg/150 mL  Status:  Discontinued        750 mg 150 mL/hr over 60 Minutes Intravenous Every M-W-F (Hemodialysis) 10/30/20 1145 11/02/20 1106   11/01/20 0815  vancomycin (VANCOREADY) IVPB 750 mg/150 mL        750 mg 150 mL/hr over 60 Minutes Intravenous  Once 11/01/20 0715 11/01/20 0937   10/31/20 1200  ceFEPIme (MAXIPIME) 1 g in sodium chloride 0.9 % 100 mL IVPB  Status:  Discontinued        1 g 200 mL/hr over 30 Minutes Intravenous Every 24 hours 10/30/20 1145 11/02/20 1106   10/30/20 1145  ceFEPIme (MAXIPIME) 2 g in sodium chloride 0.9 % 100 mL IVPB        2 g 200 mL/hr over 30 Minutes Intravenous  Once 10/30/20 1135 10/30/20 1326   10/30/20 1145  metroNIDAZOLE (FLAGYL) IVPB 500 mg        500 mg 100 mL/hr over 60 Minutes Intravenous  Once 10/30/20 1135 10/30/20 1428   10/30/20 1145  vancomycin (VANCOREADY) IVPB 1500 mg/300 mL        1,500 mg 150 mL/hr over 120 Minutes Intravenous  Once 10/30/20 1135 10/30/20 1630         Objective: Vitals:   11/03/20 2016 11/04/20 0007 11/04/20 0430 11/04/20 0700  BP: 130/67 (!) 145/76 (!) 147/61 140/65  Pulse: 86 87 88 90  Resp: '17 18 17 18  '$ Temp: 98.3 F (36.8 C) 98.8 F (37.1 C) 98.2 F (36.8 C) 98.5 F (36.9 C)  TempSrc: Oral Oral Oral Oral  SpO2: 99% 100% 99% 100%  Weight:      Height:        Intake/Output Summary (Last 24 hours) at 11/04/2020 0813 Last data filed at 11/04/2020 0715 Gross per 24 hour  Intake 440 ml  Output 1500 ml  Net -1060 ml   Filed Weights   11/01/20 1112 11/03/20 0730 11/03/20 1140  Weight: 65 kg 62.8 kg 61 kg     Examination:  General exam: drowsy, does answer questions appropriately , baseline memory deficit and dysarthria  Respiratory system: Clear to auscultation. Respiratory effort normal. Cardiovascular system: S1 & S2 heard, RRR. +precordial 2-3/6 murmur, No  pedal edema. Gastrointestinal system: Abdomen is nondistended, soft and nontender. No organomegaly or masses felt. Normal bowel sounds heard. Central nervous system: oriented to place and person, baseline dysarthria, facial droops.  Extremities: s/p right bka Skin: No rashes, lesions or ulcers Psychiatry: Judgement and insight appear normal. Mood & affect appropriate.     Data Reviewed: I have personally reviewed following labs and imaging studies  CBC: Recent Labs  Lab 10/30/20 1001 10/31/20 0258 10/31/20 2038 11/01/20 0105 11/01/20 1445 11/03/20 0304  WBC 11.9* 11.8* 10.2 10.0 8.1 12.1*  NEUTROABS 9.5*  --   --   --   --   --   HGB 10.3* 10.6* 11.4* 9.5* 9.3* 8.1*  HCT 33.6* 33.5* 36.3* 29.3* 30.0* 25.4*  MCV 97.7 93.1 94.0 92.4 94.9 91.4  PLT 255 261 256 247 228 [id number]    Basic Metabolic Panel: Recent Labs  Lab 10/30/20 1001 10/31/20 0258 11/01/20 0105 11/03/20 0304  NA 139 138 136 133*  K 3.8 4.6 5.0 5.5*  CL 97* 96* 97* 97*  CO2 '31 27 30 23  '$ GLUCOSE 191* 95 171* 140*  BUN 52* 61* 32* 65*  CREATININE 8.30* 8.74* 5.51* 8.35*  CALCIUM 9.6 9.8 9.2 9.2  PHOS  --   --  3.7  --     GFR: Estimated Creatinine Clearance: 7.4 mL/min (A) (by C-G formula based on SCr of 8.35 mg/dL (H)).  Liver Function Tests: Recent Labs  Lab 10/30/20 1001 11/01/20 0105  AST 12*  --   ALT 10  --   ALKPHOS 49  --   BILITOT 0.6  --   PROT 6.6  --   ALBUMIN 3.1* 2.8*    CBG: Recent Labs  Lab 11/03/20 2017 11/04/20 0641 11/04/20 0645 11/04/20 0708 11/04/20 0742  GLUCAP 195* 47* 51* 51* 116*     Recent Results (from the past 240 hour(s))  Blood culture (routine x 2)     Status: None   Collection Time: 10/30/20  11:18 AM   Specimen: BLOOD  Result Value Ref Range Status   Specimen Description BLOOD SITE NOT SPECIFIED  Final   Special Requests   Final    BOTTLES DRAWN AEROBIC ONLY Blood Culture results may not be optimal due to an inadequate volume of blood received in culture bottles   Culture   Final    NO GROWTH 5 DAYS Performed at [facility], [place] [address], [place], [place] [address]    Report Status 11/04/2020 FINAL  Final  Resp Panel by RT-PCR (Flu A&B, Covid) Nasopharyngeal Swab     Status: None   Collection Time: 10/30/20 11:28 AM   Specimen: Nasopharyngeal Swab; Nasopharyngeal(NP) swabs in vial transport medium  Result Value Ref Range Status   SARS Coronavirus 2 by RT PCR NEGATIVE NEGATIVE Final    Comment: (NOTE) SARS-CoV-2 target nucleic acids are NOT DETECTED.  The SARS-CoV-2 RNA is generally detectable in upper respiratory specimens during the acute phase of infection. The lowest concentration of SARS-CoV-2 viral copies this assay can detect is 138 copies/mL. A negative result does not preclude SARS-Cov-2 infection and should not be used as the sole basis for treatment or other patient management decisions. A negative result may occur with  improper specimen collection/handling, submission of specimen other than nasopharyngeal swab, presence of viral mutation(s) within the areas targeted by this assay, and inadequate number of viral copies(<138 copies/mL). A negative result must be combined with clinical observations, patient history, and epidemiological information. The expected result  is Negative.  Fact Sheet for Patients:  EntrepreneurPulse.com.au  Fact Sheet for Healthcare Providers:  IncredibleEmployment.be  This test is no t yet approved or cleared by the [place] FDA and  has been authorized for detection and/or diagnosis of SARS-CoV-2 by FDA under an Emergency Use Authorization (EUA). This EUA will remain  in effect  (meaning this test can be used) for the duration of the COVID-19 declaration under Section 564(b)(1) of the Act, 21 U.S.C.section 360bbb-3(b)(1), unless the authorization is terminated  or revoked sooner.       Influenza A by PCR NEGATIVE NEGATIVE Final   Influenza B by PCR NEGATIVE NEGATIVE Final    Comment: (NOTE) The Xpert Xpress SARS-CoV-2/FLU/RSV plus assay is intended as an aid in the diagnosis of influenza from Nasopharyngeal swab specimens and should not be used as a sole basis for treatment. Nasal washings and aspirates are unacceptable for Xpert Xpress SARS-CoV-2/FLU/RSV testing.  Fact Sheet for Patients: EntrepreneurPulse.com.au  Fact Sheet for Healthcare Providers: IncredibleEmployment.be  This test is not yet approved or cleared by the [place] FDA and has been authorized for detection and/or diagnosis of SARS-CoV-2 by FDA under an Emergency Use Authorization (EUA). This EUA will remain in effect (meaning this test can be used) for the duration of the COVID-19 declaration under Section 564(b)(1) of the Act, 21 U.S.C. section 360bbb-3(b)(1), unless the authorization is terminated or revoked.  Performed at [facility], [place] [address] [place], [place] [address]   Blood culture (routine x 2)     Status: None   Collection Time: 10/30/20  1:07 PM   Specimen: BLOOD  Result Value Ref Range Status   Specimen Description BLOOD SITE NOT SPECIFIED  Final   Special Requests   Final    BOTTLES DRAWN AEROBIC ONLY Blood Culture results may not be optimal due to an inadequate volume of blood received in culture bottles   Culture   Final    NO GROWTH 5 DAYS Performed at [facility], [place] [address], [place], [place] [address]    Report Status 11/04/2020 FINAL  Final  Surgical PCR screen     Status: None   Collection Time: 11/01/20  1:48 PM   Specimen: Nasal Mucosa; Nasal Swab  Result Value Ref Range Status   MRSA, PCR  NEGATIVE NEGATIVE Final   Staphylococcus aureus NEGATIVE NEGATIVE Final    Comment: (NOTE) The Xpert SA Assay (FDA approved for NASAL specimens in patients 8 years of age and older), is one component of a comprehensive surveillance program. It is not intended to diagnose infection nor to guide or monitor treatment. Performed at [facility], [place] [address] [place], [place] [address]          Radiology Studies: No results found.      Scheduled Meds: . allopurinol  100 mg Oral Daily  . atorvastatin  40 mg Oral Daily  . Chlorhexidine Gluconate Cloth  6 each Topical Q0600  . darbepoetin (ARANESP) injection - DIALYSIS  100 mcg Intravenous Q Wed-HD  . docusate sodium  100 mg Oral Daily  . insulin aspart  0-6 Units Subcutaneous TID WC  . lidocaine-prilocaine  1 application Topical Once per day on Mon Wed Fri  . multivitamin  1 tablet Oral QHS  . pantoprazole  40 mg Oral Daily  . predniSONE  5 mg Oral Q breakfast  . sevelamer carbonate  800 mg Oral TID WC  . Warfarin - Pharmacist Dosing Inpatient  Does not apply q1600   Continuous Infusions: . heparin    . heparin 1,150 Units/hr (11/03/20 1926)     LOS: 5 days   Time spent: 35 mins Greater than 50% of this time was spent in counseling, explanation of diagnosis, planning of further management, and coordination of care.   Voice Recognition Viviann Spare dictation system was used to create this note, attempts have been made to correct errors. Please contact the author with questions and/or clarifications.   Florencia Reasons, MD PhD FACP [facility]  Available via Epic secure chat 7am-7pm for nonurgent issues Please page for urgent issues To page the attending provider between 7A-7P or the covering provider during after hours 7P-7A, please log into the web site [email or web address] and access using universal [place] password for that web site. If you do not have the password, please call the hospital  operator.    11/04/2020, 8:13 AM

## 2020-11-04 NOTE — TOC Progression Note (Addendum)
Transition of Care (TOC) - Progression Note    Patient Details  Name: Jonathan Datta Sr. MRN: [id number] Date of Birth: July 22, 1951  Transition of Care [facility]) CM/SW [place], [place] Phone Number: 11/04/2020, 3:21 PM  Clinical Narrative:     26: CSW spoke with Amado Coe at [facility] regarding possible bed offer. CSW provides Dialysis details. Amado Coe explains pt still has a large balance from last stay. Pt's wife to call Abigail Butts at [facility] to see if they can resolve balance prior to an offer being able to be extended. CSW notified pt wife.   1400: Pt wife called CSW and stated that she spoke with [facility] and they would not be able to extend a bed offer. CSW reviewed choices further with pt wife. She chooses [facility]. CSW confirmed with [facility] they can take pt. CSW explained dialysis details. Pt possibly stable in next 24-48 hours. CSW to start SNF Auth.   1436: Auth started and clinicals Faxed. [phone]   Expected Discharge Plan: [place] Barriers to Discharge: Continued Medical Work up  Expected Discharge Plan and Services Expected Discharge Plan: [place] arrangements for the past 2 months: Single Family Home                                       Social Determinants of Health (SDOH) Interventions    Readmission Risk Interventions Readmission Risk Prevention Plan 08/10/2020  Transportation Screening Complete  Medication Review Press photographer) Complete  PCP or Specialist appointment within 3-5 days of discharge Complete  HRI or [place] Complete  SW Recovery Care/Counseling Consult Complete  [place] Not Applicable  Some recent data might be hidden

## 2020-11-04 NOTE — Progress Notes (Addendum)
   VASCULAR SURGERY ASSESSMENT & PLAN:   2 Days Post-Op Non-salvageable R foot gangrene and osteomyelitis; s/p left BKA POD 2 by Dr. Stanford Breed. VSS. Afebrile. Flaps well perfused. Dressing changed. Continue knee extender. No active bleeding. OK to restart Plavix and aspirin.  History of a. Fib and CVA maintained on warfarin. Currently on heparin infusion perioperatively. No new labs yet today.   SUBJECTIVE:   Sitting up in bed eating breakfast. Says pain is controlled  PHYSICAL EXAM:   Vitals:   11/03/20 1500 11/03/20 2016 11/04/20 0007 11/04/20 0430  BP: (!) 124/54 130/67 (!) 145/76 (!) 147/61  Pulse: 87 86 87 88  Resp: '18 17 18 17  '$ Temp: 98.3 F (36.8 C) 98.3 F (36.8 C) 98.8 F (37.1 C) 98.2 F (36.8 C)  TempSrc: Oral Oral Oral Oral  SpO2: 100% 99% 100% 99%  Weight:      Height:       General appearance: Awake, alert in no apparent distress Cardiac: Heart rate and rhythm are regular Respirations: Nonlabored Extremities: LLE: Amputation site incision is well approximated without bleeding or hematoma.  Anterior and posterior flaps are warm and well-perfused  LABS:   Lab Results  Component Value Date   WBC 12.1 (H) 11/03/2020   HGB 8.1 (L) 11/03/2020   HCT 25.4 (L) 11/03/2020   MCV 91.4 11/03/2020   PLT 219 11/03/2020   Lab Results  Component Value Date   CREATININE 8.35 (H) 11/03/2020   Lab Results  Component Value Date   INR 1.5 (H) 11/03/2020   CBG (last 3)  Recent Labs    11/04/20 0641 11/04/20 0645 11/04/20 0708  GLUCAP 47* 51* 51*    PROBLEM LIST:    Active Problems:   Osteomyelitis ([name])   CURRENT MEDS:   . allopurinol  100 mg Oral Daily  . atorvastatin  40 mg Oral Daily  . Chlorhexidine Gluconate Cloth  6 each Topical Q0600  . darbepoetin (ARANESP) injection - DIALYSIS  100 mcg Intravenous Q Wed-HD  . docusate sodium  100 mg Oral Daily  . insulin aspart  0-6 Units Subcutaneous TID WC  . insulin glargine  8 Units Subcutaneous Daily  .  lidocaine-prilocaine  1 application Topical Once per day on Mon Wed Fri  . multivitamin  1 tablet Oral QHS  . pantoprazole  40 mg Oral Daily  . predniSONE  5 mg Oral Q breakfast  . sevelamer carbonate  800 mg Oral TID WC  . Warfarin - Pharmacist Dosing Inpatient   Does not apply [place], PA-C Office: [phone] 11/04/2020   VASCULAR STAFF ADDENDUM: I agree with the above.   [name], MD [facility] [place] Phone Number: [phone] 11/04/2020 2:54 PM

## 2020-11-04 NOTE — Progress Notes (Signed)
Subjective: No complaints, states tolerated dialysis yesterday  Objective Vital signs in last 24 hours: Vitals:   11/03/20 2016 11/04/20 0007 11/04/20 0430 11/04/20 0700  BP: 130/67 (!) 145/76 (!) 147/61 140/65  Pulse: 86 87 88 90  Resp: '17 18 17 18  '$ Temp: 98.3 F (36.8 C) 98.8 F (37.1 C) 98.2 F (36.8 C) 98.5 F (36.9 C)  TempSrc: Oral Oral Oral Oral  SpO2: 99% 100% 99% 100%  Weight:      Height:       Weight change:   Physical Exam General:Alert elderly male, nad Heart:RRRno MRG Lungs:Clearbilaterally nonlabored Abdomen:soft non-tender, nondistended Extremities:No sig LE edema: RBKA bandage dry, clean Dialysis Access:RUE AVF +bruit  Dialysis Orders: [id number] 83mn 400/500 63kg 2/2 bath AVF Hep 3000 Mircera 75 q 2 wks (last 3/16)  Calcitriol 1.75 tiw  Problem/Plan: 1. R foot osteo --Empiric antibioticsper primary. VVSfollowing -SPBKA.3/28  2. PVD -- s/p R SFA stenting, RCFAendarterectomy. On Plavix.  3. ESRD -HD MWF.K + 3.9,HD 3-hour treatment to get back on Monday Wednesday Friday schedule 4. PAFib-on warfarn. SSan Antonioadmission. Per pharmacy, sinus on exam 5. Hypertension/volume - BP ok, anticipate lower dry weight with R BKA. UFas tolerated 6. Anemia - Hgb11.4> 9.5>9.3> 8.1On ESAas outpatientrestart in hospital, due next treatment 3/30 Aranesp 100 7. Metabolic bone disease -Corr Ca now 9.4/Phos ok.Continue binders.  Restart calcitriol at dialysis but 1.0   DErnest Haber PA-C [phone]/30/2022,12:48 PM  LOS: 5 days   Labs: Basic Metabolic Panel: Recent Labs  Lab 11/01/20 0105 11/03/20 0304 11/04/20 0401  NA 136 133* 136  K 5.0 5.5* 3.9  CL 97* 97* 96*  CO2 '30 23 29  '$ GLUCOSE 171* 140* 30*  BUN 32* 65* 27*  CREATININE 5.51* 8.35* 5.00*  CALCIUM 9.2 9.2 8.5*  PHOS 3.7  --   --    Liver Function Tests: Recent Labs  Lab 10/30/20 1001  11/01/20 0105  AST 12*  --   ALT 10  --   ALKPHOS 49  --   BILITOT 0.6  --   PROT 6.6  --   ALBUMIN 3.1* 2.8*   No results for input(s): LIPASE, AMYLASE in the last 168 hours. No results for input(s): AMMONIA in the last 168 hours. CBC: Recent Labs  Lab 10/30/20 1001 10/31/20 0258 10/31/20 2038 11/01/20 0105 11/01/20 1445 11/03/20 0304 11/04/20 0401  WBC 11.9*   < > 10.2 10.0 8.1 12.1* 9.7  NEUTROABS 9.5*  --   --   --   --   --   --   HGB 10.3*   < > 11.4* 9.5* 9.3* 8.1* 8.1*  HCT 33.6*   < > 36.3* 29.3* 30.0* 25.4* 25.3*  MCV 97.7   < > 94.0 92.4 94.9 91.4 92.0  PLT 255   < > 256 247 228 219 223   < > = values in this interval not displayed.   Cardiac Enzymes: No results for input(s): CKTOTAL, CKMB, CKMBINDEX, TROPONINI in the last 168 hours. CBG: Recent Labs  Lab 11/04/20 0645 11/04/20 0708 11/04/20 0742 11/04/20 1030 11/04/20 1144  GLUCAP 51* 51* 116* 148* 134*    Studies/Results: No results found. Medications: . heparin 1,150 Units/hr (11/04/20 1158)   . allopurinol  100 mg Oral Daily  . aspirin  81 mg Oral Daily  . atorvastatin  40 mg Oral Daily  . Chlorhexidine Gluconate Cloth  6 each Topical Q0600  . [START ON 11/05/2020] clopidogrel  75 mg Oral Q  breakfast  . darbepoetin (ARANESP) injection - DIALYSIS  100 mcg Intravenous Q Wed-HD  . docusate sodium  100 mg Oral Daily  . insulin aspart  0-6 Units Subcutaneous TID WC  . lidocaine-prilocaine  1 application Topical Once per day on Mon Wed Fri  . multivitamin  1 tablet Oral QHS  . pantoprazole  40 mg Oral Daily  . predniSONE  5 mg Oral Q breakfast  . sevelamer carbonate  800 mg Oral TID WC  . warfarin  10 mg Oral ONCE-1600  . Warfarin - Pharmacist Dosing Inpatient   Does not apply [id number]

## 2020-11-05 DIAGNOSIS — M86171 Other acute osteomyelitis, right ankle and foot: Secondary | ICD-10-CM

## 2020-11-05 LAB — PROTIME-INR
INR: 1.6 — ABNORMAL HIGH (ref 0.8–1.2)
Prothrombin Time: 18.8 seconds — ABNORMAL HIGH (ref 11.4–15.2)

## 2020-11-05 LAB — CBC
HCT: 26.9 % — ABNORMAL LOW (ref 39.0–52.0)
Hemoglobin: 8.3 g/dL — ABNORMAL LOW (ref 13.0–17.0)
MCH: 29.2 pg (ref 26.0–34.0)
MCHC: 30.9 g/dL (ref 30.0–36.0)
MCV: 94.7 fL (ref 80.0–100.0)
Platelets: 219 10*3/uL (ref 150–400)
RBC: 2.84 MIL/uL — ABNORMAL LOW (ref 4.22–5.81)
RDW: 18.6 % — ABNORMAL HIGH (ref 11.5–15.5)
WBC: 8.4 10*3/uL (ref 4.0–10.5)
nRBC: 0 % (ref 0.0–0.2)

## 2020-11-05 LAB — SARS CORONAVIRUS 2 (TAT 6-24 HRS): SARS Coronavirus 2: NEGATIVE

## 2020-11-05 LAB — GLUCOSE, CAPILLARY
Glucose-Capillary: 103 mg/dL — ABNORMAL HIGH (ref 70–99)
Glucose-Capillary: 152 mg/dL — ABNORMAL HIGH (ref 70–99)
Glucose-Capillary: 206 mg/dL — ABNORMAL HIGH (ref 70–99)
Glucose-Capillary: 320 mg/dL — ABNORMAL HIGH (ref 70–99)

## 2020-11-05 LAB — HEPARIN LEVEL (UNFRACTIONATED): Heparin Unfractionated: 0.3 IU/mL (ref 0.30–0.70)

## 2020-11-05 LAB — BASIC METABOLIC PANEL
Anion gap: 9 (ref 5–15)
BUN: 26 mg/dL — ABNORMAL HIGH (ref 8–23)
CO2: 28 mmol/L (ref 22–32)
Calcium: 9.1 mg/dL (ref 8.9–10.3)
Chloride: 97 mmol/L — ABNORMAL LOW (ref 98–111)
Creatinine, Ser: 4.99 mg/dL — ABNORMAL HIGH (ref 0.61–1.24)
GFR, Estimated: 12 mL/min — ABNORMAL LOW (ref >60)
Glucose, Bld: 145 mg/dL — ABNORMAL HIGH (ref 70–99)
Potassium: 4.1 mmol/L (ref 3.5–5.1)
Sodium: 134 mmol/L — ABNORMAL LOW (ref 135–145)

## 2020-11-05 LAB — SURGICAL PATHOLOGY

## 2020-11-05 MED ORDER — WARFARIN SODIUM 5 MG PO TABS
10.0000 mg | ORAL_TABLET | Freq: Once | ORAL | Status: AC
  Administered 2020-11-05: 10 mg via ORAL
  Filled 2020-11-05: qty 2

## 2020-11-05 NOTE — Progress Notes (Addendum)
Subjective: No complaints, said tolerated HD yesterday on schedule, noted awaiting SNF  Objective Vital signs in last 24 hours: Vitals:   11/04/20 1738 11/04/20 2011 11/05/20 0517 11/05/20 0906  BP: 138/63 127/66 131/72 137/64  Pulse: 91 93 89 94  Resp: '18 17 17 16  '$ Temp: 98 F (36.7 C) 98.4 F (36.9 C) 98.3 F (36.8 C) 97.6 F (36.4 C)  TempSrc: Oral Oral Oral Oral  SpO2: 99% 100% 100% 100%  Weight:      Height:       Weight change: -0.8 kg  Physical Exam General:Alert elderly male, nad Heart:RRRno MRG Lungs:CTA nonlabored Abdomen:soft non-tender, nondistended Extremities:No sig LE edema: RBKA with brace on, bandage underneath unwrapped Dialysis Access:RUE AVF +bruit  Dialysis Orders: [id number] 70mn 400/500 63kg 2/2 bath AVF Hep 3000 Mircera 75 q 2 wks (last 3/16)  Calcitriol 1.75 tiw  Problem/Plan: 1. R foot osteo --Empiric antibioticsper primary. VVSfollowing -SPBKA.3/28, awaiting S NF 2. PVD -- s/p R SFA stenting, RCFAendarterectomy. On Plavix.  3. ESRD -HD MWF.K + 3.9, yesterday HD 3-hour treatment to get back on MWF schedule 4. Hypertension/volume - BP ok without meds, anticipate lower dry weight with R BKA. UFas tolerated 5. PAFib-[place]  Management per pharmacy, sinus on exam 6. Anemia - Hgb11.4> 9.5>9.3>8.1On ESAas outpatientrestart in hospital, given 3/30 Aranesp 100 7. Metabolic bone disease -Corr Ca now 9.4/Phos ok.Continue binders.  Restart calcitriol at dialysis but 1.0   DErnest Haber PA-C [facility] [phone]/31/2022,9:26 AM  LOS: 6 days   Labs: Basic Metabolic Panel: Recent Labs  Lab 11/01/20 0105 11/03/20 0304 11/04/20 0401  NA 136 133* 136  K 5.0 5.5* 3.9  CL 97* 97* 96*  CO2 '30 23 29  '$ GLUCOSE 171* 140* 30*  BUN 32* 65* 27*  CREATININE 5.51* 8.35* 5.00*  CALCIUM 9.2 9.2 8.5*  PHOS 3.7  --   --    Liver Function  Tests: Recent Labs  Lab 10/30/20 1001 11/01/20 0105  AST 12*  --   ALT 10  --   ALKPHOS 49  --   BILITOT 0.6  --   PROT 6.6  --   ALBUMIN 3.1* 2.8*   No results for input(s): LIPASE, AMYLASE in the last 168 hours. No results for input(s): AMMONIA in the last 168 hours. CBC: Recent Labs  Lab 10/30/20 1001 10/31/20 0258 10/31/20 2038 11/01/20 0105 11/01/20 1445 11/03/20 0304 11/04/20 0401  WBC 11.9*   < > 10.2 10.0 8.1 12.1* 9.7  NEUTROABS 9.5*  --   --   --   --   --   --   HGB 10.3*   < > 11.4* 9.5* 9.3* 8.1* 8.1*  HCT 33.6*   < > 36.3* 29.3* 30.0* 25.4* 25.3*  MCV 97.7   < > 94.0 92.4 94.9 91.4 92.0  PLT 255   < > 256 247 228 219 223   < > = values in this interval not displayed.   Cardiac Enzymes: No results for input(s): CKTOTAL, CKMB, CKMBINDEX, TROPONINI in the last 168 hours. CBG: Recent Labs  Lab 11/04/20 1030 11/04/20 1144 11/04/20 1738 11/04/20 2015 11/05/20 0800  GLUCAP 148* 134* 131* 189* 103*    Studies/Results: No results found. Medications: . heparin 1,250 Units/hr (11/05/20 0917)   . allopurinol  100 mg Oral Daily  . aspirin  81 mg Oral Daily  . atorvastatin  40 mg Oral Daily  . calcitRIOL  1 mcg Oral Q M,W,F  .  Chlorhexidine Gluconate Cloth  6 each Topical Q0600  . clopidogrel  75 mg Oral Q breakfast  . darbepoetin (ARANESP) injection - DIALYSIS  100 mcg Intravenous Q Wed-HD  . docusate sodium  100 mg Oral Daily  . insulin aspart  0-6 Units Subcutaneous TID WC  . lidocaine-prilocaine  1 application Topical Once per day on Mon Wed Fri  . multivitamin  1 tablet Oral QHS  . pantoprazole  40 mg Oral Daily  . predniSONE  5 mg Oral Q breakfast  . sevelamer carbonate  800 mg Oral TID WC  . warfarin  10 mg Oral ONCE-1600  . Warfarin - Pharmacist Dosing Inpatient   Does not apply [id number]

## 2020-11-05 NOTE — Progress Notes (Signed)
  Progress Note    11/05/2020 7:38 AM 3 Days Post-Op  Subjective:  No complaints   Vitals:   11/04/20 2011 11/05/20 0517  BP: 127/66 131/72  Pulse: 93 89  Resp: 17 17  Temp: 98.4 F (36.9 C) 98.3 F (36.8 C)  SpO2: 100% 100%    Physical Exam: Incisions:  L BKA with saturated dressing but no active bleeding or other drainage   CBC    Component Value Date/Time   WBC 9.7 11/04/2020 0401   RBC 2.75 (L) 11/04/2020 0401   HGB 8.1 (L) 11/04/2020 0401   HCT 25.3 (L) 11/04/2020 0401   PLT 223 11/04/2020 0401   MCV 92.0 11/04/2020 0401   MCH 29.5 11/04/2020 0401   MCHC 32.0 11/04/2020 0401   RDW 18.5 (H) 11/04/2020 0401   LYMPHSABS 1.4 10/30/2020 1001   MONOABS 0.9 10/30/2020 1001   EOSABS 0.0 10/30/2020 1001   BASOSABS 0.1 10/30/2020 1001    BMET    Component Value Date/Time   NA 136 11/04/2020 0401   K 3.9 11/04/2020 0401   CL 96 (L) 11/04/2020 0401   CO2 29 11/04/2020 0401   GLUCOSE 30 (LL) 11/04/2020 0401   BUN 27 (H) 11/04/2020 0401   CREATININE 5.00 (H) 11/04/2020 0401   CALCIUM 8.5 (L) 11/04/2020 0401   GFRNONAA 12 (L) 11/04/2020 0401   GFRAA 13 (L) 09/22/2019 0733    INR    Component Value Date/Time   INR 1.6 (H) 11/05/2020 0506     Intake/Output Summary (Last 24 hours) at 11/05/2020 0738 Last data filed at 11/05/2020 0700 Gross per 24 hour  Intake 962.56 ml  Output 1500 ml  Net -537.44 ml     Assessment/Plan:  70 y.o. male is s/p left below knee amputation  3 Days Post-Op  - Dressing changed; continue kerlix and ACE until drainage slows - Continue knee immobilizer - TOC working on SNF placement   Dagoberto Ligas, PA-C [facility] [phone] 11/05/2020 7:38 AM    ,p

## 2020-11-05 NOTE — Progress Notes (Signed)
ANTICOAGULATION CONSULT NOTE - Follow Up Consult  Pharmacy Consult for Heparin + Coumadin Indication: h/o afib/CVA  No Known Allergies  Patient Measurements: Height: 6' (182.9 cm) Weight: 60.2 kg (132 lb 11.5 oz) IBW/kg (Calculated) : 77.6 Heparin Dosing Weight: 62.8 kg  Vital Signs: Temp: 98.3 F (36.8 C) (03/31 0517) Temp Source: Oral (03/31 0517) BP: 131/72 (03/31 0517) Pulse Rate: 89 (03/31 0517)  Labs: Recent Labs    11/03/20 0304 11/03/20 0620 11/03/20 1809 11/04/20 0401 11/04/20 1227 11/05/20 0506  HGB 8.1*  --   --  8.1*  --   --   HCT 25.4*  --   --  25.3*  --   --   PLT 219  --   --  223  --   --   LABPROT  --  17.2*  --  17.6*  --  18.8*  INR  --  1.5*  --  1.5*  --  1.6*  HEPARINUNFRC  --   --  <0.10*  --  0.24* 0.30  CREATININE 8.35*  --   --  5.00*  --   --     Estimated Creatinine Clearance: 12.2 mL/min (A) (by C-G formula based on SCr of 5 mg/dL (H)).   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 CHADS2VASC 7. Start IV heparin for bridging 3/29. - 3/31: Hep level 0.3, INR 1.6, Hgb 8.1 (non 3/31). Plts WNL  - 1 mg Vitamin K 3/25, 2 units FFP - Home dose 6 mg daily except for 9 mg on Tues, Thurs, Sun.  Goal of Therapy:  Heparin level 0.3-0.7 units/ml Monitor platelets by anticoagulation protocol: Yes   Plan:  Con't IV heparin 1250 units/hr Coumadin '10mg'$  po x 1 again tonight. Daily HL and CBC, INR   Dorrell Mitcheltree S. Alford Highland, PharmD, BCPS Clinical Staff Pharmacist [email or web address] Alford Highland, [employer] 11/05/2020,7:37 AM

## 2020-11-05 NOTE — Plan of Care (Signed)

## 2020-11-05 NOTE — Progress Notes (Addendum)
Renal Navigator has confirmed that patient is eligible to ride [facility] through November 2023 (next review). Navigator appreciates update from [facility] CSW/C. KolarNavigator that plan is for discharge tomorrow to SNF/[facility]) (INR subtherapeutic today). MD anticipates that he will be medically ready for discharge tomorrow, which is an HD day. Since this is a new SNF placement, he will need inpatient HD before discharge and Navigator will request first shift to accommodate discharge. Navigator has scheduled his first 2 trips with [facility] [phone]) for Monday 4/4, and Wednesday 11/11/20 as follows: Patient has a pick up window from [facility] to [facility]) between 10:00-10:30am to arrive to his lobby time at 11:30am.  Patient has a pick up window from [facility] back to [facility] between 4:30-5:00pm.  Patient or his wife will need to make all future trip arrangements past Wednesday 11/11/20. TOC CSW to explain this to patient/wife and to give above trip times to SNF staff to ensure that patient is out and waiting for [facility] when it arrives. Navigator will follow along for any changes in above plan and will update [facility] at discharge to provide continuity of care.  Alphonzo Cruise, [place] Renal Navigator [phone]

## 2020-11-05 NOTE — Care Management Important Message (Signed)
Important Message  Patient Details  Name: Jonathan Brilla Sr. MRN: [id number] Date of Birth: Dec 26, 1950   Medicare Important Message Given:  Yes     [name] [name] 11/05/2020, 11:54 AM

## 2020-11-05 NOTE — Progress Notes (Signed)
Renal Navigator left message for [facility] to inquire as to whether or not patient is still eligible to ride at this time. Navigator will call back shortly if call is not returned.   Alphonzo Cruise, [place] Renal Navigator [phone]

## 2020-11-05 NOTE — Evaluation (Signed)
Clinical/Bedside Swallow Evaluation Patient Details  Name: Jonathan Hedinger Sr. MRN: [id number] Date of Birth: 1951-03-02  Today's Date: 11/05/2020 Time: SLP Start Time (ACUTE ONLY): 1014 SLP Stop Time (ACUTE ONLY): 1026 SLP Time Calculation (min) (ACUTE ONLY): 12 min  Past Medical History:  Past Medical History:  Diagnosis Date  . Diabetes mellitus without complication ([place])    type 2  . FUO (fever of unknown origin) 09/17/2019  . Hypertension   . Peripheral arterial disease ([place])   . Renal disorder 2015   right kidney transplant  . Stroke [facility])    Past Surgical History:  Past Surgical History:  Procedure Laterality Date  . ABDOMINAL AORTOGRAM N/A 08/05/2020   Procedure: ABDOMINAL AORTOGRAM;  Surgeon: [name], MD;  Location: [place] CV LAB;  Service: Cardiovascular;  Laterality: N/A;  . ABDOMINAL AORTOGRAM W/LOWER EXTREMITY N/A 09/09/2020   Procedure: ABDOMINAL AORTOGRAM W/LOWER EXTREMITY;  Surgeon: [name], MD;  Location: [place] CV LAB;  Service: Cardiovascular;  Laterality: N/A;  . AMPUTATION Right 08/06/2020   Procedure: Right Fifth Toe Ray Amputation;  Surgeon: [name], MD;  Location: [place];  Service: Vascular;  Laterality: Right;  . AMPUTATION Right 11/02/2020   Procedure: RIGHT AMPUTATION BELOW KNEE;  Surgeon: [name], MD;  Location: [place];  Service: Vascular;  Laterality: Right;  . BACK SURGERY     Has had 2 back surgeries  . BUBBLE STUDY  09/18/2019   Procedure: BUBBLE STUDY;  Surgeon: Buford Dresser, MD;  Location: [facility] ENDOSCOPY;  Service: Cardiovascular;;  . Depression    . ENDARTERECTOMY FEMORAL Right 08/06/2020   Procedure: Right Ilio- Femoral Artery Endarterectomy;  Surgeon: [name], MD;  Location: [place];  Service: Vascular;  Laterality: Right;  . HAND SURGERY Right   . HAND SURGERY    . IR REMOVAL TUN CV CATH W/O FL  09/03/2019  . KIDNEY TRANSPLANT     November 2015  . LOWER EXTREMITY ANGIOGRAPHY Bilateral  08/05/2020   Procedure: LOWER EXTREMITY ANGIOGRAPHY;  Surgeon: [name], MD;  Location: [place] CV LAB;  Service: Cardiovascular;  Laterality: Bilateral;  . Memory loss    . NEPHRECTOMY TRANSPLANTED ORGAN    . PATCH ANGIOPLASTY Right 08/06/2020   Procedure: PATCH ANGIOPLASTY;  Surgeon: [name], MD;  Location: [place];  Service: Vascular;  Laterality: Right;  . PERIPHERAL VASCULAR BALLOON ANGIOPLASTY Right 09/09/2020   Procedure: PERIPHERAL VASCULAR BALLOON ANGIOPLASTY;  Surgeon: [name], MD;  Location: [place] CV LAB;  Service: Cardiovascular;  Laterality: Right;  popliteal  . PERIPHERAL VASCULAR INTERVENTION Right 08/05/2020   Procedure: PERIPHERAL VASCULAR INTERVENTION;  Surgeon: [name], MD;  Location: [place] CV LAB;  Service: Cardiovascular;  Laterality: Right;  SFA  . PERIPHERAL VASCULAR INTERVENTION Right 09/09/2020   Procedure: PERIPHERAL VASCULAR INTERVENTION;  Surgeon: [name], MD;  Location: [place] CV LAB;  Service: Cardiovascular;  Laterality: Right;  external iliac  . TEE WITHOUT CARDIOVERSION N/A 09/18/2019   Procedure: TRANSESOPHAGEAL ECHOCARDIOGRAM (TEE);  Surgeon: Buford Dresser, MD;  Location: [place];  Service: Cardiovascular;  Laterality: N/A;  . WOUND DEBRIDEMENT Right 10/08/2020   Procedure: DEBRIDEMENT WOUND RIGHT FOOT;  Surgeon: [name], MD;  Location: [facility] OR;  Service: Vascular;  Laterality: Right;   HPI:  70 yo male admitted with R foot osteomyletis who underwent a R BKA on 3/28. 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 Dysphagia impacted by pts cognitive deficits with reported and observed impulsivity with  POs. Spouse at bedside who reports pt has daily difficulties with eating and drinking at home. He has not had recurrent PNA. Pt with adequate dentition. Mild right sided oral motor deficits appreciated from prior CVA including reduced strength of facial, lingual, and labial musculature. Pt with prolonged mastication of solids with minimal oral residuals. Per spouse most pocketting exhibited with meats. Discussed strategies including finely chopping meats and adding sauces or gravies for improved cohesion. Spouse reports she generally does these things. Pt with intermittent delayed throat clear with POs this date both during and in absence of PO consumption. No overt s/sx of aspiration with any PO. Will follow up for ongoing assistance with pt and caregiver training to reduce episodic dysphagia. Continue regular diet and thin liquids with meds whole in puree and full supervision with all POs.   SLP Visit Diagnosis: Dysphagia, oropharyngeal phase (R13.12)    Aspiration Risk  Mild aspiration risk;Moderate aspiration risk    Diet Recommendation   Regular thin liquids   Medication Administration: Whole meds with puree    Other  Recommendations Oral Care Recommendations: Oral care BID   Follow up Recommendations 24 hour supervision/assistance      Frequency and Duration min 1 x/week  1 week       Prognosis Prognosis for Safe Diet Advancement: Good Barriers to Reach Goals: Cognitive deficits      Swallow Study   General Date of Onset: 10/30/20 HPI: 70 yo male admitted with R foot osteomyletis who underwent a R BKA on 3/28. 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, PSH: R 5th toe amp, abdominal aortogram with LE Type of Study: Bedside Swallow Evaluation Previous Swallow Assessment: BSE, MBSS 2021; mild oropharyngeal deficits Diet Prior to this Study: Regular;Thin  liquids Temperature Spikes Noted: No Respiratory Status: Room air History of Recent Intubation: No Behavior/Cognition: Alert;Requires cueing Oral Cavity Assessment: Within Functional Limits Oral Care Completed by SLP: No Oral Cavity - Dentition: Adequate natural dentition Vision: Functional for self-feeding Self-Feeding Abilities: Needs set up Patient Positioning: Upright in bed Baseline Vocal Quality: Normal Volitional Swallow: Able to elicit    Oral/Motor/Sensory Function Overall Oral Motor/Sensory Function: Mild impairment Facial ROM: Reduced right;Suspected CN VII (facial) dysfunction Facial Symmetry: Abnormal symmetry right Facial Strength: Reduced right Lingual ROM: Reduced right Lingual Strength: Reduced   Ice Chips Ice chips: Within functional limits   Thin Liquid Thin Liquid: Impaired Presentation: Cup;Straw Oral Phase Functional Implications: Prolonged oral transit Pharyngeal  Phase Impairments: Suspected delayed Swallow;Multiple swallows;Throat Clearing - Delayed    Nectar Thick Nectar Thick Liquid: Not tested   Honey Thick Honey Thick Liquid: Not tested   Puree Puree: Within functional limits   Solid     Solid: Impaired Presentation: Self Fed Oral Phase Impairments: Reduced lingual movement/coordination Oral Phase Functional Implications: Oral residue;Prolonged oral transit;Impaired mastication Pharyngeal Phase Impairments: Suspected delayed Swallow;Multiple swallows      Maurisa Tesmer H. MA, CCC-SLP Acute Rehabilitation Services   11/05/2020,10:50 AM

## 2020-11-05 NOTE — Progress Notes (Signed)
PROGRESS NOTE    Jonathan Fazzina Sr.  [id number] DOB: 11-18-1950 DOA: 10/30/2020 PCP: Ginger Organ., MD    Chief Complaint  Patient presents with  . 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, remains on heparin drip, INR 1.6 today No hypoglycemia event this morning He is fully alert, he knows he is in the hospital, not able to states the name, he knows the year, does not know the months, baseline dysarthria, wife states this is he baseline after cva in 07/2019  Assessment & Plan:   Active Problems:   Osteomyelitis ([place])   Right diabetic foot infection with osteomyelitis -He initially received antibiotic Vanco and cefepime -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, wife reports he was on eliquis for his heart, eliquis changed to coumadin after the stroke in 2020, wife is wondering if can change back to eliquis, as patient is having a hard time to have the Coumadin level regulated, defer to outpatient cardiology and neurology -Per chart review patient had stroke in December 2020 during Covid infection, thought stroke due to left ventricular thrombus though TEE was not done due to Covid infection, repeat TEE in 09/2019 did not show thrombus in left ventricle  -on heparin drip  perioperatively, Coumadin resumed on 3/29 -Monitor INR  CAD s/p prior MI, chronic systolic chf Denies chest pain, on satin Volume managed by dialysis  H/o 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 vs PAF On coumadin, statin  NIDDM2 wife reports patient has been off long-acting insulin at home With hypoglycemic event in hospital Hold Lantus, continue SSI Continue hypoglycemia protocol  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 Management per nephrology Patient reports does not have local nephrologist, wife report patient has been continued on prednisone 5 mg daily PTA because he continued to make some urine Will need to follow-up with nephrology  FTT: will need snf placement     Body mass index is 18 kg/m.Marland Kitchen     Unresulted Labs (From admission, onward)          Start     Ordered   11/06/20 0500  CBC  Daily,   R     Question:  Specimen collection method  Answer:  Lab=Lab collect   11/05/20 0734   11/05/20 [id number]  Basic metabolic panel  Once,   R       Question:  Specimen collection method  Answer:  Lab=Lab collect   11/05/20 1021   11/05/20 1021  CBC  Once,   R       Question:  Specimen collection method  Answer:  Lab=Lab collect   11/05/20 1020   11/04/20 1034  Urinalysis, Routine w reflex microscopic  Once,   R        11/04/20 1033   11/04/20  0500  Heparin level (unfractionated)  Daily,   R     Question:  Specimen collection method  Answer:  Lab=Lab collect   11/03/20 0811   11/03/20 0500  Protime-INR  Daily,   R     Question:  Specimen collection method  Answer:  Lab=Lab collect   11/02/20 1632            DVT prophylaxis: SCD's Start: 11/02/20 1037 warfarin (COUMADIN) tablet 10 mg   Code Status: Full Family Communication: wife at bedside  Disposition:   Status is: Inpatient   Dispo: The patient is from: home              Anticipated d/c is to: SNF              Anticipated d/c date is: when clears by vascular surgery, needs to monitor hgb , INR                possible 24 to 48 hours  Consultants:   Vascular surgery  Nephrology  Procedures:   Right BKA  Hemodialysis  Antimicrobials:    Anti-infectives (From admission, onward)   Start     Dose/Rate Route Frequency Ordered Stop   11/02/20 1200  vancomycin (VANCOREADY) IVPB 750 mg/150 mL  Status:  Discontinued        750 mg 150 mL/hr over 60 Minutes Intravenous Every M-W-F (Hemodialysis) 10/30/20 1145 11/02/20 1106   11/01/20 0815  vancomycin (VANCOREADY) IVPB 750 mg/150 mL        750 mg 150 mL/hr over 60 Minutes Intravenous  Once 11/01/20 0715 11/01/20 0937   10/31/20 1200  ceFEPIme (MAXIPIME) 1 g in sodium chloride 0.9 % 100 mL IVPB  Status:  Discontinued        1 g 200 mL/hr over 30 Minutes Intravenous Every 24 hours 10/30/20 1145 11/02/20 1106   10/30/20 1145  ceFEPIme (MAXIPIME) 2 g in sodium chloride 0.9 % 100 mL IVPB        2 g 200 mL/hr over 30 Minutes Intravenous  Once 10/30/20 1135 10/30/20 1326   10/30/20 1145  metroNIDAZOLE (FLAGYL) IVPB 500 mg        500 mg 100 mL/hr over 60 Minutes Intravenous  Once 10/30/20 1135 10/30/20 1428   10/30/20 1145  vancomycin (VANCOREADY) IVPB 1500 mg/300 mL        1,500 mg 150 mL/hr over 120 Minutes Intravenous  Once 10/30/20 1135 10/30/20 1630         Objective: Vitals:   11/04/20 1738 11/04/20 2011 11/05/20 0517 11/05/20 0906  BP: 138/63 127/66 131/72 137/64  Pulse: 91 93 89 94  Resp: '18 17 17 16  '$ Temp: 98 F (36.7 C) 98.4 F (36.9 C) 98.3 F (36.8 C) 97.6 F (36.4 C)  TempSrc: Oral Oral Oral Oral  SpO2: 99% 100% 100% 100%  Weight:      Height:        Intake/Output Summary (Last 24 hours) at 11/05/2020 1022 Last data filed at 11/05/2020 0700 Gross per 24 hour  Intake 722.56 ml  Output 1500 ml  Net -777.44 ml   Filed Weights   11/03/20 1140 11/04/20 1335 11/04/20 1655  Weight: 61 kg 62 kg 60.2 kg    Examination:  General exam: fully alert , baseline memory deficit and dysarthria  Respiratory system: Clear to  auscultation. Respiratory effort normal. Cardiovascular system: S1 & S2 heard, RRR. +precordial 2-3/6 murmur, No pedal edema. Gastrointestinal system: Abdomen is nondistended, soft and nontender. No  organomegaly or masses felt. Normal bowel sounds heard. Central nervous system: oriented to place and person, baseline dysarthria, facial droops.  Extremities: s/p right bka Skin: No rashes, lesions or ulcers Psychiatry: Judgement and insight appear normal. Mood & affect appropriate.     Data Reviewed: I have personally reviewed following labs and imaging studies  CBC: Recent Labs  Lab 10/30/20 1001 10/31/20 0258 10/31/20 2038 11/01/20 0105 11/01/20 1445 11/03/20 0304 11/04/20 0401  WBC 11.9*   < > 10.2 10.0 8.1 12.1* 9.7  NEUTROABS 9.5*  --   --   --   --   --   --   HGB 10.3*   < > 11.4* 9.5* 9.3* 8.1* 8.1*  HCT 33.6*   < > 36.3* 29.3* 30.0* 25.4* 25.3*  MCV 97.7   < > 94.0 92.4 94.9 91.4 92.0  PLT 255   < > 256 247 228 219 223   < > = values in this interval not displayed.    Basic Metabolic Panel: Recent Labs  Lab 10/30/20 1001 10/31/20 0258 11/01/20 0105 11/03/20 0304 11/04/20 0401  NA 139 138 136 133* 136  K 3.8 4.6 5.0 5.5* 3.9  CL 97* 96* 97* 97* 96*  CO2 '31 27 30 23 29  '$ GLUCOSE 191* 95 171* 140* 30*  BUN 52* 61* 32* 65* 27*  CREATININE 8.30* 8.74* 5.51* 8.35* 5.00*  CALCIUM 9.6 9.8 9.2 9.2 8.5*  PHOS  --   --  3.7  --   --     GFR: Estimated Creatinine Clearance: 12.2 mL/min (A) (by C-G formula based on SCr of 5 mg/dL (H)).  Liver Function Tests: Recent Labs  Lab 10/30/20 1001 11/01/20 0105  AST 12*  --   ALT 10  --   ALKPHOS 49  --   BILITOT 0.6  --   PROT 6.6  --   ALBUMIN 3.1* 2.8*    CBG: Recent Labs  Lab 11/04/20 1030 11/04/20 1144 11/04/20 1738 11/04/20 2015 11/05/20 0800  GLUCAP 148* 134* 131* 189* 103*     Recent Results (from the past 240 hour(s))  Blood culture (routine x 2)     Status: None   Collection Time: 10/30/20  11:18 AM   Specimen: BLOOD  Result Value Ref Range Status   Specimen Description BLOOD SITE NOT SPECIFIED  Final   Special Requests   Final    BOTTLES DRAWN AEROBIC ONLY Blood Culture results may not be optimal due to an inadequate volume of blood received in culture bottles   Culture   Final    NO GROWTH 5 DAYS Performed at [facility], [place] [address] [place], [place] [address]    Report Status 11/04/2020 FINAL  Final  Resp Panel by RT-PCR (Flu A&B, Covid) Nasopharyngeal Swab     Status: None   Collection Time: 10/30/20 11:28 AM   Specimen: Nasopharyngeal Swab; Nasopharyngeal(NP) swabs in vial transport medium  Result Value Ref Range Status   SARS Coronavirus 2 by RT PCR NEGATIVE NEGATIVE Final    Comment: (NOTE) SARS-CoV-2 target nucleic acids are NOT DETECTED.  The SARS-CoV-2 RNA is generally detectable in upper respiratory specimens during the acute phase of infection. The lowest concentration of SARS-CoV-2 viral copies this assay can detect is 138 copies/mL. A negative result does not preclude SARS-Cov-2 infection and should not be used as the sole basis for treatment or other patient management decisions. A negative result may occur with  improper specimen collection/handling, submission of specimen other  than nasopharyngeal swab, presence of viral mutation(s) within the areas targeted by this assay, and inadequate number of viral copies(<138 copies/mL). A negative result must be combined with clinical observations, patient history, and epidemiological information. The expected result is Negative.  Fact Sheet for Patients:  EntrepreneurPulse.com.au  Fact Sheet for Healthcare Providers:  IncredibleEmployment.be  This test is no t yet approved or cleared by the [place] FDA and  has been authorized for detection and/or diagnosis of SARS-CoV-2 by FDA under an Emergency Use Authorization (EUA). This EUA will remain  in effect  (meaning this test can be used) for the duration of the COVID-19 declaration under Section 564(b)(1) of the Act, 21 U.S.C.section 360bbb-3(b)(1), unless the authorization is terminated  or revoked sooner.       Influenza A by PCR NEGATIVE NEGATIVE Final   Influenza B by PCR NEGATIVE NEGATIVE Final    Comment: (NOTE) The Xpert Xpress SARS-CoV-2/FLU/RSV plus assay is intended as an aid in the diagnosis of influenza from Nasopharyngeal swab specimens and should not be used as a sole basis for treatment. Nasal washings and aspirates are unacceptable for Xpert Xpress SARS-CoV-2/FLU/RSV testing.  Fact Sheet for Patients: EntrepreneurPulse.com.au  Fact Sheet for Healthcare Providers: IncredibleEmployment.be  This test is not yet approved or cleared by the [place] FDA and has been authorized for detection and/or diagnosis of SARS-CoV-2 by FDA under an Emergency Use Authorization (EUA). This EUA will remain in effect (meaning this test can be used) for the duration of the COVID-19 declaration under Section 564(b)(1) of the Act, 21 U.S.C. section 360bbb-3(b)(1), unless the authorization is terminated or revoked.  Performed at [facility], [place] [address], [place], [place] [address]   Blood culture (routine x 2)     Status: None   Collection Time: 10/30/20  1:07 PM   Specimen: BLOOD  Result Value Ref Range Status   Specimen Description BLOOD SITE NOT SPECIFIED  Final   Special Requests   Final    BOTTLES DRAWN AEROBIC ONLY Blood Culture results may not be optimal due to an inadequate volume of blood received in culture bottles   Culture   Final    NO GROWTH 5 DAYS Performed at [facility], [place] [address], [place], [place] [address]    Report Status 11/04/2020 FINAL  Final  Surgical PCR screen     Status: None   Collection Time: 11/01/20  1:48 PM   Specimen: Nasal Mucosa; Nasal Swab  Result Value Ref Range Status   MRSA, PCR  NEGATIVE NEGATIVE Final   Staphylococcus aureus NEGATIVE NEGATIVE Final    Comment: (NOTE) The Xpert SA Assay (FDA approved for NASAL specimens in patients 40 years of age and older), is one component of a comprehensive surveillance program. It is not intended to diagnose infection nor to guide or monitor treatment. Performed at [facility], [place] [address], [place], [place] [address]          Radiology Studies: No results found.      Scheduled Meds: . allopurinol  100 mg Oral Daily  . aspirin  81 mg Oral Daily  . atorvastatin  40 mg Oral Daily  . calcitRIOL  1 mcg Oral Q M,W,F  . Chlorhexidine Gluconate Cloth  6 each Topical Q0600  . clopidogrel  75 mg Oral Q breakfast  . darbepoetin (ARANESP) injection - DIALYSIS  100 mcg Intravenous Q Wed-HD  . docusate sodium  100 mg Oral Daily  . insulin aspart  0-6 Units Subcutaneous TID WC  . lidocaine-prilocaine  1 application Topical Once per day on Mon Wed Fri  . multivitamin  1 tablet Oral QHS  . pantoprazole  40 mg Oral Daily  . predniSONE  5 mg Oral Q breakfast  . sevelamer carbonate  800 mg Oral TID WC  . warfarin  10 mg Oral ONCE-1600  . Warfarin - Pharmacist Dosing Inpatient   Does not apply q1600   Continuous Infusions: . heparin 1,250 Units/hr (11/05/20 0917)     LOS: 6 days   Time spent: 25 mins Greater than 50% of this time was spent in counseling, explanation of diagnosis, planning of further management, and coordination of care.   Voice Recognition Viviann Spare dictation system was used to create this note, attempts have been made to correct errors. Please contact the author with questions and/or clarifications.   Florencia Reasons, MD PhD FACP [facility]  Available via Epic secure chat 7am-7pm for nonurgent issues Please page for urgent issues To page the attending provider between 7A-7P or the covering provider during after hours 7P-7A, please log into the web site [email or web address] and access using  universal [place] password for that web site. If you do not have the password, please call the hospital operator.    11/05/2020, 10:22 AM

## 2020-11-05 NOTE — TOC Progression Note (Signed)
Transition of Care (TOC) - Progression Note    Patient Details  Name: Jonathan Maguire Sr. MRN: [id number] Date of Birth: [birth year]  Transition of Care [facility]) CM/SW [place], [place] Phone Number: 11/05/2020, 10:18 AM  Clinical Narrative:     CSW received message from [place] requesting verbal update on pt's wound care. CSW is also informed that [facility] is out of network with pt's specific BCBS and pt would have a higher out of pocket cost. CSW called Navi and updated infor on pt's wound care. CSW is notified that only other facility that has offered on pt that is in network with pt's [place] is [facility].   CSW received a call from pt's wife shortly after stating that she does not want to go to [place] after visiting facility. CSW also informed pt wife that out of pocket cost would be higher at this facility. CSW informed wife of offers at [facility] and [facility]. She plans to visit this am and call CSW with choice.   CSW called Juliann Pulse with [facility] and confirmed they could accept pt if pt's dialysis transportation could be arranged.  [place] Pt's wife called CSW and wants [facility]. Wife states that pt previously had [facility] transportation but has not used it in about 6 months.   CSW notified Renal Navigator who will work on Chief Financial Officer.   1000 CSW called Navi care coordinator William Hamburger and notified her of facility change to [place]. She takes information and states pt's Josem Kaufmann will be on her priority list.    Expected Discharge Plan: [place] Barriers to Discharge: Continued Medical Work up  Expected Discharge Plan and Services Expected Discharge Plan: [place] arrangements for the past 2 months: Single Family Home                                       Social Determinants of Health (SDOH) Interventions    Readmission Risk Interventions Readmission Risk  Prevention Plan 08/10/2020  Transportation Screening Complete  Medication Review Press photographer) Complete  PCP or Specialist appointment within 3-5 days of discharge Complete  HRI or [place] Complete  SW Recovery Care/Counseling Consult Complete  [place] Not Applicable  Some recent data might be hidden

## 2020-11-05 NOTE — Plan of Care (Signed)

## 2020-11-06 DIAGNOSIS — M8660 Other chronic osteomyelitis, unspecified site: Secondary | ICD-10-CM

## 2020-11-06 DIAGNOSIS — I513 Intracardiac thrombosis, not elsewhere classified: Secondary | ICD-10-CM

## 2020-11-06 DIAGNOSIS — Z7901 Long term (current) use of anticoagulants: Secondary | ICD-10-CM

## 2020-11-06 LAB — GLUCOSE, CAPILLARY
Glucose-Capillary: 153 mg/dL — ABNORMAL HIGH (ref 70–99)
Glucose-Capillary: 94 mg/dL (ref 70–99)
Glucose-Capillary: 172 mg/dL — ABNORMAL HIGH (ref 70–99)
Glucose-Capillary: 134 mg/dL — ABNORMAL HIGH (ref 70–99)

## 2020-11-06 LAB — PROTIME-INR
INR: 2 — ABNORMAL HIGH (ref 0.8–1.2)
Prothrombin Time: 22.2 seconds — ABNORMAL HIGH (ref 11.4–15.2)

## 2020-11-06 LAB — CBC
HCT: 24.1 % — ABNORMAL LOW (ref 39.0–52.0)
Hemoglobin: 7.7 g/dL — ABNORMAL LOW (ref 13.0–17.0)
MCH: 30.1 pg (ref 26.0–34.0)
MCHC: 32 g/dL (ref 30.0–36.0)
MCV: 94.1 fL (ref 80.0–100.0)
Platelets: 240 10*3/uL (ref 150–400)
RBC: 2.56 MIL/uL — ABNORMAL LOW (ref 4.22–5.81)
RDW: 18.6 % — ABNORMAL HIGH (ref 11.5–15.5)
WBC: 8.6 10*3/uL (ref 4.0–10.5)
nRBC: 0 % (ref 0.0–0.2)

## 2020-11-06 LAB — RENAL FUNCTION PANEL
Albumin: 2.3 g/dL — ABNORMAL LOW (ref 3.5–5.0)
Anion gap: 7 (ref 5–15)
BUN: 41 mg/dL — ABNORMAL HIGH (ref 8–23)
CO2: 27 mmol/L (ref 22–32)
Calcium: 8.8 mg/dL — ABNORMAL LOW (ref 8.9–10.3)
Chloride: 98 mmol/L (ref 98–111)
Creatinine, Ser: 6.88 mg/dL — ABNORMAL HIGH (ref 0.61–1.24)
GFR, Estimated: 8 mL/min — ABNORMAL LOW (ref >60)
Glucose, Bld: 155 mg/dL — ABNORMAL HIGH (ref 70–99)
Phosphorus: 3.5 mg/dL (ref 2.5–4.6)
Potassium: 4.4 mmol/L (ref 3.5–5.1)
Sodium: 132 mmol/L — ABNORMAL LOW (ref 135–145)

## 2020-11-06 LAB — HEPARIN LEVEL (UNFRACTIONATED): Heparin Unfractionated: 0.18 IU/mL — ABNORMAL LOW (ref 0.30–0.70)

## 2020-11-06 MED ORDER — OXYCODONE-ACETAMINOPHEN 5-325 MG PO TABS: 1.0000 | ORAL_TABLET | Freq: Four times a day (QID) | ORAL | 0 refills | Status: DC | PRN

## 2020-11-06 MED ORDER — CALCITRIOL 0.5 MCG PO CAPS: 1.0000 ug | ORAL_CAPSULE | ORAL | Status: DC

## 2020-11-06 MED ORDER — SENNOSIDES-DOCUSATE SODIUM 8.6-50 MG PO TABS: 1.0000 | ORAL_TABLET | Freq: Two times a day (BID) | ORAL | Status: DC

## 2020-11-06 MED ORDER — WARFARIN SODIUM 6 MG PO TABS
9.0000 mg | ORAL_TABLET | Freq: Once | ORAL | Status: AC
  Administered 2020-11-06: 9 mg via ORAL
  Filled 2020-11-06: qty 1

## 2020-11-06 MED ORDER — HYDROMORPHONE HCL 1 MG/ML IJ SOLN
INTRAMUSCULAR | Status: AC
  Administered 2020-11-06: 1 mg via INTRAVENOUS
  Filled 2020-11-06: qty 1

## 2020-11-06 MED ORDER — CALCITRIOL 0.5 MCG PO CAPS
ORAL_CAPSULE | ORAL | Status: AC
  Administered 2020-11-06: 1 ug via ORAL
  Filled 2020-11-06: qty 2

## 2020-11-06 MED ORDER — INSULIN ASPART 100 UNIT/ML ~~LOC~~ SOLN: SUBCUTANEOUS | 0 refills | Status: DC

## 2020-11-06 NOTE — Plan of Care (Signed)

## 2020-11-06 NOTE — TOC Progression Note (Signed)
Transition of Care (TOC) - Progression Note    Patient Details  Name: Jonathan Miedema Sr. MRN: [id number] Date of Birth: 1951/02/06  Transition of Care [facility]) CM/SW [place], [place] Phone Number: 11/06/2020, 8:35 AM  Clinical Narrative:     Josem Kaufmann received [id number] Auth ID: [id number] Approved 3/31 - 4/4.    Expected Discharge Plan: [place] Barriers to Discharge: Continued Medical Work up  Expected Discharge Plan and Services Expected Discharge Plan: [place] arrangements for the past 2 months: Single Family Home                                       Social Determinants of Health (SDOH) Interventions    Readmission Risk Interventions Readmission Risk Prevention Plan 08/10/2020  Transportation Screening Complete  Medication Review Press photographer) Complete  PCP or Specialist appointment within 3-5 days of discharge Complete  HRI or [place] Complete  SW Recovery Care/Counseling Consult Complete  [place] Not Applicable  Some recent data might be hidden

## 2020-11-06 NOTE — Progress Notes (Signed)
OT Cancellation Note  Patient Details Name: Jonathan Payant Sr. MRN: [id number] DOB: 07-31-1951   Cancelled Treatment:    Reason Eval/Treat Not Completed: Patient at procedure or test/ unavailable. Pt at HD, OT will follow up next available time Britt [place] 11/06/2020, 9:08 AM

## 2020-11-06 NOTE — NC FL2 (Signed)
[place] MEDICAID FL2 LEVEL OF CARE SCREENING TOOL     IDENTIFICATION  Patient Name: Jonathan Turnbaugh Sr. Birthdate: Mar 18, 1951 Sex: male Admission Date (Current Location): 10/30/2020  [facility] and [place] Number:  Herbalist and Address:  [facility]. [facility], [place] [address], [place], [place] [address]      Provider Number: [id number]  Attending Physician Name and Address:  Florencia Reasons, MD  Relative Name and Phone Number:  [place], Najera [facility])   [phone] [facility])    Current Level of Care: Hospital Recommended Level of Care: [place] Prior Approval Number:    Date Approved/Denied:   PASRR Number: [id number]  Discharge Plan: SNF    Current Diagnoses: Patient Active Problem List   Diagnosis Date Noted  . Osteomyelitis ([place]) 10/30/2020  . Encounter for general adult medical examination without abnormal findings 09/15/2020  . Hemiplegia of dominant side ([place]) 09/15/2020  . History of cerebrovascular accident 09/15/2020  . History of COVID-19 09/15/2020  . Hypercoagulable state ([place]) 09/15/2020  . Other chronic osteomyelitis, right ankle and foot ([place]) 09/15/2020  . Right flank pain 09/15/2020  . Atherosclerosis of native arteries of the extremities with ulceration ([place]) 08/05/2020  . Encounter for removal of sutures 07/27/2020  . Neuropathic ulcer of foot, unspecified laterality, limited to breakdown of skin ([place]) 06/09/2020  . Anemia 01/16/2020  . Headache, unspecified 12/03/2019  . Other disorders of phosphorus metabolism 10/25/2019  . Unspecified protein-calorie malnutrition ([place]) 09/27/2019  . FUO (fever of unknown origin) 09/17/2019  . Cerebral thrombosis with cerebral infarction 08/02/2019  . Cerebral embolism with cerebral infarction 08/02/2019  . Acute respiratory failure due to COVID-19 ([place]) 07/30/2019  . Acute respiratory failure with hypoxia ([place]) 07/30/2019  . Allergy, unspecified, initial encounter 05/27/2019   . Chronic systolic HF (heart failure) ([place]) 02/25/2019  . ESRD (end stage renal disease) on dialysis ([place]) 02/25/2019  . 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 kidney disease, or end stage renal disease ([place]) 12/04/2018  . Hypoglycemia, unspecified 08/17/2018  . Generalized abdominal pain 07/16/2018  . Hypercalcemia 06/28/2018  . Arteriovenous fistula for hemodialysis in place, primary ([place]) 05/15/2018  . 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 Anemia in chronic kidney disease 05/13/2018  . Idiopathic gout, unspecified site 01/24/2018  . Complication of vascular dialysis catheter 01/16/2018  . Other specified coagulation defects ([place]) 01/16/2018  . Pain, unspecified 01/16/2018  . Pruritus, unspecified 01/16/2018  . Secondary hyperparathyroidism of renal origin ([place]) 01/16/2018  . Shortness of breath 01/16/2018  . Type 2 diabetes mellitus with diabetic peripheral angiopathy without gangrene ([place]) 01/16/2018  . Abnormal gait 01/10/2018  . Dependence on renal dialysis ([place]) 01/10/2018  . Iron deficiency anemia, unspecified 12/21/2017  . Acute blood loss anemia 12/14/2017  . Hypomagnesemia 12/03/2017  . Anticoagulated 12/02/2017  . Stroke ([place]) 11/29/2017  . 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 History of MI (myocardial infarction) 11/28/2017  . Hypertension 11/28/2017  . Chronic hepatitis C without hepatic coma ([place]) 09/25/2017  . Diabetes mellitus type 2, uncomplicated ([place]) [id number]  . Myocardial infarction ([place]) 09/25/2017  . Renal transplant recipient 09/25/2017  . Transfusion history 09/25/2017  . Vascular dementia without behavioral disturbance ([place]) 03/10/2017  . History of coronary artery disease 09/12/2016  . 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 MI ([place]) 09/12/2016  . Acute coronary thrombosis not resulting in myocardial infarction ([place]) 09/12/2016  . Aftercare following organ  transplant 09/11/2016  .  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) 09/01/2016  . Hyperlipidemia 09/01/2016  . Long term (current) use of anticoagulants 09/01/2016  . 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 At risk for infection transmitted from donor 06/16/2014  . Ureteral stent displacement ([place]) 06/10/2014  . Prophylactic antibiotic 06/09/2014  . Immunosuppression ([place]) 06/06/2014    Orientation RESPIRATION BLADDER Height & Weight     Self  Normal Continent Weight: 140 lb 6.9 oz (63.7 kg) Height:  6' (182.9 cm)  BEHAVIORAL SYMPTOMS/MOOD NEUROLOGICAL BOWEL NUTRITION STATUS      Continent Diet (see d/c summary)  AMBULATORY STATUS COMMUNICATION OF NEEDS Skin   Extensive Assist Verbally Surgical wounds,PU Stage and Appropriate Care (Diabetic ulcer right foot anterior; incision right leg)                       Personal Care Assistance Level of Assistance  Bathing,Feeding,Dressing Bathing Assistance: Maximum assistance Feeding assistance: Independent Dressing Assistance: Limited assistance     Functional Limitations Info  Sight,Hearing,Speech Sight Info: Adequate Hearing Info: Adequate Speech Info: Adequate    SPECIAL CARE FACTORS FREQUENCY  PT (By licensed PT),OT (By licensed OT)     PT Frequency: 5x/week OT Frequency: 5x/week            Contractures Contractures Info: Not present    Additional Factors Info  Code Status,Allergies Code Status Info: Full Allergies Info: no known allergies           Current Medications (11/06/2020):  This is the current hospital active medication list Current Facility-Administered Medications  Medication Dose Route Frequency Provider Last Rate Last Admin  . acetaminophen (TYLENOL) tablet 325-650 mg  325-650 mg Oral Q6H PRN Baglia, Corrina, PA-C      . allopurinol (ZYLOPRIM) tablet 100 mg  100 mg Oral Daily Baglia, Corrina, PA-C   100 mg at 11/05/20 0912  . aspirin chewable tablet 81 mg  81 mg Oral Daily Florencia Reasons,  MD   81 mg at 11/05/20 0912  . atorvastatin (LIPITOR) tablet 40 mg  40 mg Oral Daily Baglia, Corrina, PA-C   40 mg at 11/05/20 0912  . calcitRIOL (ROCALTROL) capsule 1 mcg  1 mcg Oral Q M,W,F Ernest Haber, PA-C   1 mcg at 11/06/20 [id number]  . Chlorhexidine Gluconate Cloth 2 % PADS 6 each  6 each Topical Q0600 Baglia, Corrina, PA-C   6 each at 11/04/20 0517  . clopidogrel (PLAVIX) tablet 75 mg  75 mg Oral Q breakfast Florencia Reasons, MD   75 mg at 11/05/20 0912  . Darbepoetin Alfa (ARANESP) injection 100 mcg  100 mcg Intravenous Q Wed-HD Ernest Haber, PA-C   100 mcg at 11/04/20 1500  . diphenhydrAMINE (BENADRYL) 12.5 MG/5ML elixir 12.5-25 mg  12.5-25 mg Oral Q4H PRN Baglia, Corrina, PA-C      . docusate sodium (COLACE) capsule 100 mg  100 mg Oral Daily Baglia, Corrina, PA-C   100 mg at 11/05/20 0912  . feeding supplement (BOOST HIGH PROTEIN) liquid 237 mL  1 Container Oral Daily PRN Baglia, Corrina, PA-C      . guaiFENesin-dextromethorphan (ROBITUSSIN DM) 100-10 MG/5ML syrup 15 mL  15 mL Oral Q4H PRN Baglia, Corrina, PA-C      . heparin ADULT infusion 100 units/mL (25000 units/276m)  1,250 Units/hr Intravenous Continuous RKarren Cobble RPH 12.5 mL/hr at 11/06/20 0800 1,250 Units/hr at 11/06/20 0800  . hydrALAZINE (APRESOLINE) injection 5 mg  5 mg Intravenous Q20 Min PRN Baglia, Corrina, PA-C      .  HYDROmorphone (DILAUDID) injection 0.5-1 mg  0.5-1 mg Intravenous Q2H PRN Baglia, Corrina, PA-C   1 mg at 11/06/20 0827  . insulin aspart (novoLOG) injection 0-6 Units  0-6 Units Subcutaneous TID WC Baglia, Corrina, PA-C   2 Units at 11/05/20 1800  . labetalol (NORMODYNE) injection 10 mg  10 mg Intravenous Q10 min PRN Baglia, Corrina, PA-C      . levalbuterol (XOPENEX) nebulizer solution 0.63 mg  0.63 mg Inhalation Q8H PRN Baglia, Corrina, PA-C      . lidocaine-prilocaine (EMLA) cream 1 application  1 application Topical Once per day on Mon Wed Fri Baglia, Corrina, PA-C      . metoprolol tartrate  (LOPRESSOR) injection 2-5 mg  2-5 mg Intravenous Q2H PRN Baglia, Corrina, PA-C      . multivitamin (RENA-VIT) tablet 1 tablet  1 tablet Oral QHS Baglia, Corrina, PA-C   1 tablet at 11/05/20 2133  . ondansetron (ZOFRAN) injection 4 mg  4 mg Intravenous Q6H PRN Baglia, Corrina, PA-C      . oxyCODONE-acetaminophen (PERCOCET/ROXICET) 5-325 MG per tablet 1 tablet  1 tablet Oral Q6H PRN Baglia, Corrina, PA-C   1 tablet at 11/04/20 2059  . pantoprazole (PROTONIX) EC tablet 40 mg  40 mg Oral Daily Baglia, Corrina, PA-C   40 mg at 11/05/20 0912  . phenol (CHLORASEPTIC) mouth spray 1 spray  1 spray Mouth/Throat PRN Baglia, Corrina, PA-C      . predniSONE (DELTASONE) tablet 5 mg  5 mg Oral Q breakfast Baglia, Corrina, PA-C   5 mg at 11/05/20 0912  . senna-docusate (Senokot-S) tablet 1 tablet  1 tablet Oral Daily PRN Baglia, Corrina, PA-C      . sevelamer carbonate (RENVELA) tablet 800 mg  800 mg Oral TID WC Baglia, Corrina, PA-C   800 mg at 11/05/20 1759  . Warfarin - Pharmacist Dosing Inpatient   Does not apply q1600 Theotis Burrow [facility]   Given at 11/04/20 [id number]     Discharge Medications: Please see discharge summary for a list of discharge medications.  Relevant Imaging Results:  Relevant Lab Results:   Additional Information SSN: [id number]  Bethann Berkshire, LCSW

## 2020-11-06 NOTE — Progress Notes (Signed)
PT Cancellation Note  Patient Details Name: Jonathan Bou Sr. MRN: [id number] DOB: July 13, 1951   Cancelled Treatment:    Reason Eval/Treat Not Completed: Patient at procedure or test/unavailable (Pt off unit for HD will f/u per POC.)   Tyke Outman Eli Hose 11/06/2020, 10:59 AM  Erasmo Leventhal , PTA Acute Rehabilitation Services Pager [phone] Office [phone]

## 2020-11-06 NOTE — Progress Notes (Signed)
ANTICOAGULATION CONSULT NOTE - Follow Up Consult  Pharmacy Consult for Heparin + Coumadin Indication: h/o afib/CVA  No Known Allergies  Patient Measurements: Height: 6' (182.9 cm) Weight: 63.7 kg (140 lb 6.9 oz) IBW/kg (Calculated) : 77.6 Heparin Dosing Weight: 62.8 kg  Vital Signs: Temp: 98.2 F (36.8 C) (04/01 0730) Temp Source: Oral (04/01 0730) BP: 104/52 (04/01 1030) Pulse Rate: 82 (04/01 1030)  Labs: Recent Labs    11/04/20 0401 11/04/20 1227 11/05/20 0506 11/05/20 1104 11/06/20 0435 11/06/20 0750  HGB 8.1*  --   --  8.3* 7.7*  --   HCT 25.3*  --   --  26.9* 24.1*  --   PLT 223  --   --  219 240  --   LABPROT 17.6*  --  18.8*  --  22.2*  --   INR 1.5*  --  1.6*  --  2.0*  --   HEPARINUNFRC  --  0.24* 0.30  --  0.18*  --   CREATININE 5.00*  --   --  4.99*  --  6.88*    Estimated Creatinine Clearance: 9.4 mL/min (A) (by C-G formula based on SCr of 6.88 mg/dL (H)).   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 CHADS2VASC 7. Start IV heparin for bridging 3/29. - 4/1 INR 1.6>>2, HL 0.18. D/W Dr. Erlinda Hong, will d/c heparin and continue warfarin - Home dose 6 mg daily except for 9 mg on Tues, Thurs, Sun.  Goal of Therapy:  Heparin level 0.3-0.7 units/ml Monitor platelets by anticoagulation protocol: Yes   Plan:  D/C heparin Coumadin 9 mg po x 1 tonight. Daily, INR   Trayonna Bachmeier A. Levada Dy, PharmD, BCPS, FNKF Clinical Pharmacist [place] Please utilize Amion for appropriate phone number to reach the unit pharmacist ([place])   11/06/2020,10:52 AM

## 2020-11-06 NOTE — Progress Notes (Addendum)
Attempted to give report to [facility], no answer at this time.   At 18:30 -- RN attempted to give report again but still no answer from facility.

## 2020-11-06 NOTE — Progress Notes (Signed)
[facility] Progress Note   Subjective:   Pt seen on HD. No concerns today, denies SOB, CP, palpitations, dizziness, and nausea. Leg pain well controlled.   Objective Vitals:   11/06/20 0735 11/06/20 0800 11/06/20 0830 11/06/20 0900  BP: (!) 144/68 121/61 (!) 106/54 (!) 97/49  Pulse: 87 60 84 84  Resp:      Temp:      TempSrc:      SpO2:      Weight:      Height:       Physical Exam General: WDWN male, alert and in NAD Heart: RRR, no murmurs, rubs or gallops Lungs: CTA anteriorly, respirations unlabored on RA Abdomen: Soft, non-tender, non-distended, +BS Extremities: R BKA with bandage/brace, no edema b/l lower extremities Dialysis Access: RUE AVF accessed on HD  Additional Objective Labs: Basic Metabolic Panel: Recent Labs  Lab 11/01/20 0105 11/03/20 0304 11/04/20 0401 11/05/20 1104 11/06/20 0750  NA 136   < > 136 134* 132*  K 5.0   < > 3.9 4.1 4.4  CL 97*   < > 96* 97* 98  CO2 30   < > '29 28 27  '$ GLUCOSE 171*   < > 30* 145* 155*  BUN 32*   < > 27* 26* 41*  CREATININE 5.51*   < > 5.00* 4.99* 6.88*  CALCIUM 9.2   < > 8.5* 9.1 8.8*  PHOS 3.7  --   --   --  3.5   < > = values in this interval not displayed.   Liver Function Tests: Recent Labs  Lab 10/30/20 1001 11/01/20 0105 11/06/20 0750  AST 12*  --   --   ALT 10  --   --   ALKPHOS 49  --   --   BILITOT 0.6  --   --   PROT 6.6  --   --   ALBUMIN 3.1* 2.8* 2.3*   CBC: Recent Labs  Lab 10/30/20 1001 10/31/20 0258 11/01/20 1445 11/03/20 0304 11/04/20 0401 11/05/20 1104 11/06/20 0435  WBC 11.9*   < > 8.1 12.1* 9.7 8.4 8.6  NEUTROABS 9.5*  --   --   --   --   --   --   HGB 10.3*   < > 9.3* 8.1* 8.1* 8.3* 7.7*  HCT 33.6*   < > 30.0* 25.4* 25.3* 26.9* 24.1*  MCV 97.7   < > 94.9 91.4 92.0 94.7 94.1  PLT 255   < > 228 219 223 219 240   < > = values in this interval not displayed.   Blood Culture    Component Value Date/Time   SDES BLOOD SITE NOT SPECIFIED 10/30/2020 1307    SPECREQUEST  10/30/2020 1307    BOTTLES DRAWN AEROBIC ONLY Blood Culture results may not be optimal due to an inadequate volume of blood received in culture bottles   CULT  10/30/2020 1307    NO GROWTH 5 DAYS Performed at [facility], [place] [address], [place], [place] [address]    REPTSTATUS 11/04/2020 FINAL 10/30/2020 1307    CBG: Recent Labs  Lab 11/05/20 0800 11/05/20 1156 11/05/20 1717 11/05/20 2136 11/06/20 0651  GLUCAP 103* 152* 206* 320* 153*   Medications: . heparin 1,250 Units/hr (11/06/20 0800)   . allopurinol  100 mg Oral Daily  . aspirin  81 mg Oral Daily  . atorvastatin  40 mg Oral Daily  . calcitRIOL  1 mcg Oral Q M,W,F  . Chlorhexidine Gluconate  Cloth  6 each Topical [id number]  . clopidogrel  75 mg Oral Q breakfast  . darbepoetin (ARANESP) injection - DIALYSIS  100 mcg Intravenous Q Wed-HD  . docusate sodium  100 mg Oral Daily  . insulin aspart  0-6 Units Subcutaneous TID WC  . lidocaine-prilocaine  1 application Topical Once per day on Mon Wed Fri  . multivitamin  1 tablet Oral QHS  . pantoprazole  40 mg Oral Daily  . predniSONE  5 mg Oral Q breakfast  . sevelamer carbonate  800 mg Oral TID WC  . Warfarin - Pharmacist Dosing Inpatient   Does not apply q1600    Outpatient Dialysis Orders: [id number] 39mn 400/500 63kg 2/2 bath AVF Hep 3000 Mircera 75 q 2 wks (last 3/16)  Calcitriol 1.75 tiw  Assessment/Plan:  1. R foot osteo - Completed course of antibiotics, s/p BKA3/28. Planning to discharge to SNF 2. PVD - s/p R SFA stenting, RCFAendarterectomy. On Plavix.  3. ESRD -Tolerating dialysis well, continue MWF schedule 4. Hypertension/volume - BP ok without meds, anticipate lower dry weight with R BKA. UFas tolerated 5. PAFib-on warfarn. SMassillonadmission.  Management per pharmacy 6. Anemia - Hgb11.4>>7.7 post op. Given Aranesp 1063m on 3/30. Continue ESA, transfuse PRN.  7. Metabolic bone disease -Corr [place]  high, VDRA dose decreased. Phosphorus controlled, continue renvela.    SaAnice PaganiniPA-C 11/06/2020, 9:34 AM  [facility] Pager: [phone]

## 2020-11-06 NOTE — TOC Transition Note (Addendum)
Transition of Care [facility]) - CM/SW Discharge Note   Patient Details  Name: Jonathan Droz Sr. MRN: [id number] Date of Birth: 1950/12/10  [facility] CM/SW Contact:  Bethann Berkshire, [place] Phone Number: 11/06/2020, 2:47 PM   Clinical Narrative:     Patient will DC to: [place] Anticipated DC date: 11/06/20 Family notified: Marcell Barlow Transport by: Corey Harold   Per MD patient ready for DC to [facility]. RN, patient, patient's family, and facility notified of DC. Discharge Summary and FL2 sent to facility. RN to call report prior to discharge [phone] room 130). DC packet on chart. Ambulance transport requested for patient.   CSW will sign off for now as social work intervention is no longer needed. Please consult [place] again if new needs arise.   Final next level of care: Skilled Nursing Facility Barriers to Discharge: No Barriers Identified   Patient Goals and CMS Choice   CMS Medicare.gov Compare Post Acute Care list provided to:: Patient Represenative (must comment) (Wife) Choice offered to / list presented to : Spouse  Discharge Placement              Patient chooses bed at: [facility] Patient to be transferred to facility by: [place] Name of family member notified: Nathnael Shawcroft Patient and family notified of of transfer: 11/06/20  Discharge Plan and Services                                     Social Determinants of Health (SDOH) Interventions     Readmission Risk Interventions Readmission Risk Prevention Plan 08/10/2020  Transportation Screening Complete  Medication Review Press photographer) Complete  PCP or Specialist appointment within 3-5 days of discharge Complete  HRI or [place] Complete  SW Recovery Care/Counseling Consult Complete  [place] Not Applicable  Some recent data might be hidden

## 2020-11-06 NOTE — Discharge Summary (Addendum)
Discharge Summary  Jonathan Posso Sr. [id number] DOB: 1950-09-11  PCP: Ginger Organ., MD  Admit date: 10/30/2020 Discharge date: 11/06/2020  Time spent: 85mns, more than 50% time spent on coordination of care.   Recommendations for Outpatient Follow-up:  1. F/u with SNF MD  for hospital discharge follow up, repeat cbc/bmp/INR at follow up 2. F/u with vascular surgery on 4/19 3. [facility] with cardiology, [facility]. [facility], [facility] 5. F/u with nephrology, continue HD 6. Recommend palliative care following at SNF due to multiple comorbidities/FTT.    Discharge Diagnoses:  Active Hospital Problems   Diagnosis Date Noted  . Osteomyelitis ([place] 10/30/2020    Resolved Hospital Problems  No resolved problems to display.    Discharge Condition: stable  Diet recommendation: renal /carb modified  Filed Weights   11/04/20 1655 11/06/20 0725 11/06/20 1145  Weight: 60.2 kg 63.7 kg 61.6 kg    History of present illness:  Chief Complaint: Right foot infection  HPI: Jonathan NhanSr. is a 70y.o. male with medical history significant of PVD with chronic right foot nonhealing wound, s/p recent right CFA stenting in 09/2020 IDDM, ESRD on HD MWF, HTN, presented with worsening of the right foot ulcers.  Patient has had nonhealing right foot ulcer, for which he has been following with vascular surgeon, and last month, he underwent right CFA stenting.  However he has noticed worsening of the right foot ulcer, with pain and swelling.  He went to see vascular surgeon yesterday and was told to come in the hospital for firing of worsening infection and may need surgical intervention.  Denies any fever chills. ED Course: Vital signs stable, no hypotension, no fever. WBC 11.9.  Foot x-ray showed suspicious osteomyelitis fifth, fourth and third metatarsal.  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 Keep knee immobilizer on at all times while in bed. 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 patient now relocated to [place] wound like have care locally, cardiology/ neurology  referral sent -Per chart review patient had stroke in December 2020 during Covid infection, thought stroke due to left ventricular thrombus though TEE was not done due to Covid infection, repeat TEE in 09/2019 did not show thrombus in left ventricle  -on heparin drip perioperatively, Coumadin resumed on 3/29, now INR therapeutic -continue coumadin, SNF MD to monitor INR  CAD s/p  prior MI, chronic systolic chf Denies chest pain, on satin Volume managed by dialysis Patient wanted transfer care locally, Referral sent [facility]ology   H/o 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 vs PAF ( though no documentation of paf in Care  Everywhere) On coumadin, statin Referral sent to [facility]  NIDDM2 wife reports patient has been off long-acting insulin at home With hypoglycemic event while on low dose lantus in the hospital Lantus discontinued , continue SSI Continue hypoglycemia protocol  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 Management per nephrology  wife reports patient has been continued on prednisone 5 mg daily PTA because he continued to make some urine follow-up with nephrology   FTT: snf placement     Body mass index is 18 kg/m.Marland Kitchen   Consultants:   Vascular surgery  Nephrology  Procedures:   Right BKA  Hemodialysis   Discharge Exam: BP 124/63 (BP Location: Left Arm)   Pulse 87   Temp 98.3 F (36.8 C) (Oral)   Resp 17   Ht 6' (1.829 m)   Wt 61.6 kg   SpO2 98%   BMI 18.42 kg/m   General exam: fully alert , baseline memory deficit and dysarthria  Respiratory system: Clear to auscultation. Respiratory effort normal. Cardiovascular system: S1 & S2 heard, RRR. +precordial 2-3/6 murmur, No pedal edema. Gastrointestinal system: Abdomen is nondistended, soft and nontender. No organomegaly or masses felt. Normal bowel sounds heard. Central nervous system: oriented to place and person, not to time,  baseline dysarthria, facial droops.  Extremities: s/p right bka Skin: No rashes, lesions or ulcers Psychiatry: Judgement and insight appear normal. Mood & affect appropriate  Discharge Instructions You were cared for by a hospitalist during your hospital stay. If you have any questions about your discharge medications or the care you received while you were in the hospital after you are discharged, you can call the unit and asked to speak with the hospitalist on call if the hospitalist that took care of you is not available. Once you are discharged, your primary care physician will handle any further medical issues. Please note that NO REFILLS for any discharge  medications will be authorized once you are discharged, as it is imperative that you return to your primary care physician (or establish a relationship with a primary care physician if you do not have one) for your aftercare needs so that they can reassess your need for medications and monitor your lab values.  Discharge Instructions    Amb Referral to Palliative Care   Complete by: As directed    Ambulatory referral to Neurology   Complete by: As directed    An appointment is requested in approximately: 8 weeks  Patient had CVA in 07/2020, was seen by neurology in the hospital, but never follow up with neurology after discharge. Please arrange follow up appointment , thank you   Diet general   Complete by: As directed    Renal diet with 1200 fluids restriction and carb modified   Discharge wound care:   Complete by: As directed    change bandage to stump daily with gauze and ace bandage. Keep knee immobilizer on at all times while in bed. follow up with vascular surgery Dr Stanford Breed   Increase activity slowly   Complete by: As directed      Allergies as of 11/06/2020   No Known Allergies     Medication List    STOP taking these medications   insulin aspart  protamine- aspart (70-30) 100 UNIT/ML injection Commonly known as: NOVOLOG MIX 70/30   Tresiba FlexTouch 200 UNIT/ML FlexTouch Pen Generic drug: insulin degludec     TAKE these medications   allopurinol 100 MG tablet Commonly known as: ZYLOPRIM Take 100 mg by mouth at bedtime.   aspirin 81 MG chewable tablet Chew 81 mg by mouth daily.   atorvastatin 40 MG tablet Commonly known as: LIPITOR Take 40 mg by mouth daily.   b complex-vitamin c-folic acid 0.8 MG Tabs tablet Take 1 tablet by mouth daily.   calcitRIOL 0.5 MCG capsule Commonly known as: ROCALTROL Take 2 capsules (1 mcg total) by mouth every Monday, Wednesday, and Friday. Start taking on: November 09, 2020   clopidogrel 75 MG tablet Commonly known as:  PLAVIX Take 1 tablet (75 mg total) by mouth daily with breakfast.   feeding supplement Liqd Take 1 Container by mouth daily as needed (nutrition).   insulin aspart 100 UNIT/ML injection Commonly known as: novoLOG Insulin sliding scale: Blood sugar  120-150   3units                       151-200   4units                       201-250   7units                       251- 300  11units                       301-350   15uints                       351-400   20units                       >400         call MD immediately   Insulin Pen Needle 32G X 4 MM Misc 1 each by Other route as needed (insulin).   levalbuterol 45 MCG/ACT inhaler Commonly known as: XOPENEX HFA Inhale 2 puffs into the lungs every 8 (eight) hours as needed for wheezing.   lidocaine-prilocaine cream Commonly known as: EMLA Apply 1 application topically 3 (three) times a week. 30 minutes prior to Dialysis - MWF   oxyCODONE-acetaminophen 5-325 MG tablet Commonly known as: PERCOCET/ROXICET Take 1 tablet by mouth every 6 (six) hours as needed for moderate pain.   predniSONE 5 MG tablet Commonly known as: DELTASONE Take 5 mg by mouth daily with breakfast.   senna-docusate 8.6-50 MG tablet Commonly known as: Senokot-S Take 1 tablet by mouth 2 (two) times daily. Hold if diarrhea What changed:   when to take this  reasons to take this  additional instructions   sevelamer carbonate 800 MG tablet Commonly known as: RENVELA Take 800 mg by mouth 3 (three) times daily with meals.   warfarin 6 MG tablet Commonly known as: COUMADIN Take 6-9 mg by mouth See admin instructions. Take 9 mg on Tues, Thurs, and Sun. Take 6 mg on Sun, Mon, Wed, Fri, and Sat            Discharge Care Instructions  (From admission, onward)         Start     Ordered   11/06/20 0000  Discharge wound care:       Comments: change bandage to stump daily  with gauze and ace bandage. Keep knee immobilizer on at all times while in  bed. follow up with vascular surgery Dr Stanford Breed   11/06/20 1346         No Known Allergies    The results of significant diagnostics from this hospitalization (including imaging, microbiology, ancillary and laboratory) are listed below for reference.    Significant Diagnostic Studies: DG Foot Complete Right  Result Date: 10/30/2020 CLINICAL DATA:  Foot infection EXAM: RIGHT FOOT COMPLETE - 3+ VIEW COMPARISON:  05/05/2020 FINDINGS: Interval postsurgical change of transmetatarsal amputation of the fifth ray at the level of the proximal metatarsal metadiaphysis. There is irregularity along the lateral aspect of the residual fifth metatarsal at the resection margin concerning for osteomyelitis. Additional cortical erosion along the lateral cortex of the fifth metatarsal base compatible with osteomyelitis. There are new marginal erosions involving the lateral aspects of the third and fourth metatarsal heads compatible with acute osteomyelitis. There is fragmentation along the base of the fourth toe proximal phalanx laterally, which could be secondary to infection. Soft tissue irregularity along the lateral aspect of the forefoot which may reflect ulceration or surgical wound dehiscence. Air within the soft tissues at the level of the residual fifth metatarsal could reflect ulceration, soft tissue gas, or artifact related to overlying bandaging. Severe extensive vascular calcifications. Similar degree of degenerative changes throughout the foot. IMPRESSION: 1. Interval postsurgical change of transmetatarsal amputation of the fifth ray at the level of the proximal fifth metatarsal metadiaphysis. 2. Cortical erosion along the lateral aspect of the residual fifth metatarsal at the resection margin and extending to the fifth metatarsal base compatible with osteomyelitis. 3. New marginal erosions involving the lateral aspects of the third and fourth metatarsal heads compatible with acute osteomyelitis. 4. Air  within the soft tissues at the level of the residual fifth metatarsal could reflect ulceration, soft tissue gas, or artifact related to overlying bandaging. Electronically Signed   By: Davina Poke D.O.   On: 10/30/2020 12:23    Microbiology: Recent Results (from the past 240 hour(s))  Blood culture (routine x 2)     Status: None   Collection Time: 10/30/20 11:18 AM   Specimen: BLOOD  Result Value Ref Range Status   Specimen Description BLOOD SITE NOT SPECIFIED  Final   Special Requests   Final    BOTTLES DRAWN AEROBIC ONLY Blood Culture results may not be optimal due to an inadequate volume of blood received in culture bottles   Culture   Final    NO GROWTH 5 DAYS Performed at [facility], [address] [place], [place] [address]    Report Status 11/04/2020 FINAL  Final  Resp Panel by RT-PCR (Flu A&B, Covid) Nasopharyngeal Swab     Status: None   Collection Time: 10/30/20 11:28 AM   Specimen: Nasopharyngeal Swab; Nasopharyngeal(NP) swabs in vial transport medium  Result Value Ref Range Status   SARS Coronavirus 2 by RT PCR NEGATIVE NEGATIVE Final    Comment: (NOTE) SARS-CoV-2 target nucleic acids are NOT DETECTED.  The SARS-CoV-2 RNA is generally detectable in upper respiratory specimens during the acute phase of infection. The lowest concentration of SARS-CoV-2 viral copies this assay can detect is 138 copies/mL. A negative result does not preclude SARS-Cov-2 infection and should not be used as the sole basis for treatment or other patient management decisions. A negative result may occur with  improper specimen collection/handling, submission of specimen other than nasopharyngeal swab, presence of viral mutation(s) within  the areas targeted by this assay, and inadequate number of viral copies(<138 copies/mL). A negative result must be combined with clinical observations, patient history, and epidemiological information. The expected result is Negative.  Fact  Sheet for Patients:  EntrepreneurPulse.com.au  Fact Sheet for Healthcare Providers:  IncredibleEmployment.be  This test is no t yet approved or cleared by the [place] FDA and  has been authorized for detection and/or diagnosis of SARS-CoV-2 by FDA under an Emergency Use Authorization (EUA). This EUA will remain  in effect (meaning this test can be used) for the duration of the COVID-19 declaration under Section 564(b)(1) of the Act, 21 U.S.C.section 360bbb-3(b)(1), unless the authorization is terminated  or revoked sooner.       Influenza A by PCR NEGATIVE NEGATIVE Final   Influenza B by PCR NEGATIVE NEGATIVE Final    Comment: (NOTE) The Xpert Xpress SARS-CoV-2/FLU/RSV plus assay is intended as an aid in the diagnosis of influenza from Nasopharyngeal swab specimens and should not be used as a sole basis for treatment. Nasal washings and aspirates are unacceptable for Xpert Xpress SARS-CoV-2/FLU/RSV testing.  Fact Sheet for Patients: EntrepreneurPulse.com.au  Fact Sheet for Healthcare Providers: IncredibleEmployment.be  This test is not yet approved or cleared by the [place] FDA and has been authorized for detection and/or diagnosis of SARS-CoV-2 by FDA under an Emergency Use Authorization (EUA). This EUA will remain in effect (meaning this test can be used) for the duration of the COVID-19 declaration under Section 564(b)(1) of the Act, 21 U.S.C. section 360bbb-3(b)(1), unless the authorization is terminated or revoked.  Performed at [facility], [place] [address] [place], [place] [address]   Blood culture (routine x 2)     Status: None   Collection Time: 10/30/20  1:07 PM   Specimen: BLOOD  Result Value Ref Range Status   Specimen Description BLOOD SITE NOT SPECIFIED  Final   Special Requests   Final    BOTTLES DRAWN AEROBIC ONLY Blood Culture results may not be optimal due to an  inadequate volume of blood received in culture bottles   Culture   Final    NO GROWTH 5 DAYS Performed at [facility], [place] [address] [place], [place] [address]    Report Status 11/04/2020 FINAL  Final  Surgical PCR screen     Status: None   Collection Time: 11/01/20  1:48 PM   Specimen: Nasal Mucosa; Nasal Swab  Result Value Ref Range Status   MRSA, PCR NEGATIVE NEGATIVE Final   Staphylococcus aureus NEGATIVE NEGATIVE Final    Comment: (NOTE) The Xpert SA Assay (FDA approved for NASAL specimens in patients 72 years of age and older), is one component of a comprehensive surveillance program. It is not intended to diagnose infection nor to guide or monitor treatment. Performed at [facility], [place] [address], [place], [place] [address]   SARS CORONAVIRUS 2 (TAT 6-24 HRS) Nasopharyngeal Nasopharyngeal Swab     Status: None   Collection Time: 11/05/20 10:49 AM   Specimen: Nasopharyngeal Swab  Result Value Ref Range Status   SARS Coronavirus 2 NEGATIVE NEGATIVE Final    Comment: (NOTE) SARS-CoV-2 target nucleic acids are NOT DETECTED.  The SARS-CoV-2 RNA is generally detectable in upper and lower respiratory specimens during the acute phase of infection. Negative results do not preclude SARS-CoV-2 infection, do not rule out co-infections with other pathogens, and should not be used as the sole basis for treatment or other patient management decisions. Negative  results must be combined with clinical observations, patient history, and epidemiological information. The expected result is Negative.  Fact Sheet for Patients: SugarRoll.be  Fact Sheet for Healthcare Providers: https://www.woods-mathews.com/  This test is not yet approved or cleared by the [place] FDA and  has been authorized for detection and/or diagnosis of SARS-CoV-2 by FDA under an Emergency Use Authorization (EUA). This EUA will remain  in effect  (meaning this test can be used) for the duration of the COVID-19 declaration under Se ction 564(b)(1) of the Act, 21 U.S.C. section 360bbb-3(b)(1), unless the authorization is terminated or revoked sooner.  Performed at [facility], [place] [address] [place], [name] [address]      Labs: Basic Metabolic Panel: Recent Labs  Lab 11/01/20 0105 11/03/20 0304 11/04/20 0401 11/05/20 1104 11/06/20 0750  NA 136 133* 136 134* 132*  K 5.0 5.5* 3.9 4.1 4.4  CL 97* 97* 96* 97* 98  CO2 '30 23 29 28 27  '$ GLUCOSE 171* 140* 30* 145* 155*  BUN 32* 65* 27* 26* 41*  CREATININE 5.51* 8.35* 5.00* 4.99* 6.88*  CALCIUM 9.2 9.2 8.5* 9.1 8.8*  PHOS 3.7  --   --   --  3.5   Liver Function Tests: Recent Labs  Lab 11/01/20 0105 11/06/20 0750  ALBUMIN 2.8* 2.3*   No results for input(s): LIPASE, AMYLASE in the last 168 hours. No results for input(s): AMMONIA in the last 168 hours. CBC: Recent Labs  Lab 11/01/20 1445 11/03/20 0304 11/04/20 0401 11/05/20 1104 11/06/20 0435  WBC 8.1 12.1* 9.7 8.4 8.6  HGB 9.3* 8.1* 8.1* 8.3* 7.7*  HCT 30.0* 25.4* 25.3* 26.9* 24.1*  MCV 94.9 91.4 92.0 94.7 94.1  PLT 228 219 223 219 240   Cardiac Enzymes: No results for input(s): CKTOTAL, CKMB, CKMBINDEX, TROPONINI in the last 168 hours. BNP: BNP (last 3 results) No results for input(s): BNP in the last 8760 hours.  ProBNP (last 3 results) No results for input(s): PROBNP in the last 8760 hours.  CBG: Recent Labs  Lab 11/05/20 1156 11/05/20 1717 11/05/20 2136 11/06/20 0651 11/06/20 1301  GLUCAP 152* 206* 320* 153* 94       Signed:  Florencia Reasons MD, PhD, FACP  [facility] 11/06/2020, 2:31 PM

## 2020-11-06 NOTE — Plan of Care (Signed)
Received Pt on bed from HD. Pt not in distress, alert and oriented x 4, vital signs taken and recorded.  Wife at bedside updated.  Problem: Health Behavior/Discharge Planning: Goal: Ability to manage health-related needs will improve Outcome: Progressing   Problem: Clinical Measurements: Goal: Ability to maintain clinical measurements within normal limits will improve Outcome: Progressing   Problem: Nutrition: Goal: Adequate nutrition will be maintained Outcome: Progressing   Problem: Coping: Goal: Level of anxiety will decrease Outcome: Progressing   Problem: Elimination: Goal: Will not experience complications related to bowel motility Outcome: Progressing   Problem: Pain Managment: Goal: General experience of comfort will improve Outcome: Progressing   Problem: Safety: Goal: Ability to remain free from injury will improve Outcome: Progressing   Problem: Skin Integrity: Goal: Risk for impaired skin integrity will decrease Outcome: Progressing

## 2020-11-06 NOTE — Progress Notes (Signed)
Called facility and gave report to [facility], all questions and concerns addressed, Pt not in distress, wife at bedside updated.

## 2020-11-07 NOTE — Progress Notes (Signed)
Patient stable and pain free on discharge. Wife at bedside and has belongs. IV removed. Facility called.

## 2020-11-12 ENCOUNTER — Other Ambulatory Visit: Payer: Self-pay

## 2020-11-12 ENCOUNTER — Non-Acute Institutional Stay: Payer: Medicare Other | Admitting: Hospice

## 2020-11-12 DIAGNOSIS — Z89511 Acquired absence of right leg below knee: Secondary | ICD-10-CM

## 2020-11-12 DIAGNOSIS — Z515 Encounter for palliative care: Secondary | ICD-10-CM

## 2020-11-12 DIAGNOSIS — I739 Peripheral vascular disease, unspecified: Secondary | ICD-10-CM

## 2020-11-12 DIAGNOSIS — N186 End stage renal disease: Secondary | ICD-10-CM

## 2020-11-12 NOTE — Progress Notes (Signed)
PATIENT NAME: Jonathan [name] [id number] (home)  DOB: Jan 26, 1951 MRN: [id number]  PRIMARY CARE PROVIDER:    Ginger Organ., MD,  [place] [address] [phone]  REFERRING PROVIDER:   Martinique Blattenberger, NP  RESPONSIBLE PARTY:   Self Emergency contact: Kayleen Memos [phone]  CHIEF COMPLAIN: Initial palliative care visit/PVD/right BKA  Visit is to build trust and highlight Palliative Medicine as specialized medical care for people living with serious illness, aimed at facilitating better quality of life through symptoms relief, assisting with advance care plan and establishing goals of care.  Discussion on the difference between Palliative and Hospice care.  NP called [name]; not able to leave a voicemail due to no capability on her side.  Patient endorsed palliative service. Visit consisted of counseling and education dealing with the complex and emotionally intense issues of symptom management and palliative care in the setting of serious and potentially life-threatening illness. Palliative care team will continue to support patient, patient's family, and medical team.  RECOMMENDATIONS/PLAN:   Advance Care Planning/CODE STATUS: Patient affirmed he is a full code  GOALS OF CARE: Goals of care include to maximize quality of life and symptom management.  I spent 20 minutes providing this initial consultation. More than 50% of the time in this consultation was spent on counseling patient and coordinating communication. -------------------------------------------------------------------------------------------------------------------------------------- Symptom Management/Plan:  Right BKA: stump followed by facility wound nurse.  No report of signs of infection.  Patient has oxycodone 5/325 mg every 6 hours as needed for pain.  Continue ongoing supportive care, PT/OT. Follow up with vascular surgeon  as planned. End-stage renal disease on hemodialysis: Continue hemodialysis as scheduled Monday, Wednesday and Friday. Type 2 diabetes mellitus: Continue insulin as ordered, diabetic diet, no concentrated sweets. Vascular dementia: FAST 6D. Continue on going supportive care.  Palliative will continue to monitor for symptom management/decline and make recommendations as needed. Return 6 weeks or prn. Encouraged to call provider sooner with any concerns.   PPS: 30%  Family /Caregiver/Community Supports: Patient in SNF for acute rehab.   HISTORY OF PRESENT ILLNESS:  Jonathan Easter Sr. is a 70 y.o. male with multiple medical problems including PVD which is worsening;  recent hospitalization 3/25 - 11/06/2020 for right chronic right foot nonhealing wound.  In February 2022 patient underwent  right CFA stenting which did not stop the wound worsening with pain and swelling, with suspicious osteomyelitis fifth, fourth and third metatarsal, impairing quality of life. 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  Patient reports amputation has been helpful as the pain subsided; he denies chills, pain/discomfort; no phantom pain.  History of PVD, end-stage renal disease, type 2 diabetes mellitus, stroke.  History obtained from review of EMR, discussion with primary nurse, patient/family.   Review and summarization of Epic records shows history from other than patient. Rest of 10 point ROS asked and negative.  Palliative Care was asked to follow this patient by consultation request of Ginger Organ., MD to help address complex decision making in the context of advance care planning and goals of care clarification.    HOSPICE ELIGIBILITY/DIAGNOSIS: TBD  PAST MEDICAL HISTORY:  Past Medical History:  Diagnosis Date  . Diabetes mellitus without complication ([place])    type 2  . FUO (fever of unknown origin) 09/17/2019  . Hypertension   . Peripheral arterial disease ([place])   . Renal disorder 2015   right kidney transplant   . Stroke [facility])  SOCIAL HX: Patient in SNF for acute rehab Social History   Tobacco Use  . Smoking status: Former Research scientist (life sciences)  . Smokeless tobacco: Never Used  Substance Use Topics  . Alcohol use: Never    FAMILY HX:  Family History  Problem Relation Age of Onset  . Diabetes Mellitus II Mother   . Diabetes Mellitus II Brother     Review of lab tests/diagnostics   Recent Labs  Lab 11/05/20 1104 11/06/20 0435  WBC 8.4 8.6  HGB 8.3* 7.7*  HCT 26.9* 24.1*  PLT 219 240  MCV 94.7 94.1   Recent Labs  Lab 11/05/20 1104 11/06/20 0750  NA 134* 132*  K 4.1 4.4  CL 97* 98  CO2 28 27  BUN 26* 41*  CREATININE 4.99* 6.88*  GLUCOSE 145* 155*   Latest GFR by Cockcroft Gault (not valid in AKI or ESRD) Estimated Creatinine Clearance: 9.1 mL/min (A) (by C-G formula based on SCr of 6.88 mg/dL (H)). No results for input(s): AST, ALT, ALKPHOS, GGT in the last 168 hours.  Invalid input(s): TBILI, CONJBILI, ALB, TOTALPROTEIN No components found for: ALB Recent Labs  Lab 11/06/20 0435  INR 2.0*   No results for input(s): BNP, PROBNP in the last 168 hours.  ALLERGIES: No Known Allergies    PERTINENT MEDICATIONS:  Outpatient Encounter Medications as of 11/12/2020  Medication Sig  . allopurinol (ZYLOPRIM) 100 MG tablet Take 100 mg by mouth at bedtime.  Marland Kitchen aspirin 81 MG chewable tablet Chew 81 mg by mouth daily.   Marland Kitchen atorvastatin (LIPITOR) 40 MG tablet Take 40 mg by mouth daily.  Marland Kitchen b complex-vitamin c-folic acid (NEPHRO-VITE) 0.8 MG TABS tablet Take 1 tablet by mouth daily.  . calcitRIOL (ROCALTROL) 0.5 MCG capsule Take 2 capsules (1 mcg total) by mouth every Monday, Wednesday, and Friday.  . clopidogrel (PLAVIX) 75 MG tablet TAKE 1 TABLET (75 MG TOTAL) BY MOUTH DAILY WITH BREAKFAST.  . feeding supplement (BOOST HIGH PROTEIN) LIQD Take 1 Container by mouth daily as needed (nutrition).  . insulin aspart (NOVOLOG) 100 UNIT/ML injection Insulin sliding scale: Blood sugar  120-150    3units                       151-200   4units                       201-250   7units                       251- 300  11units                       301-350   15uints                       351-400   20units                       >400         call MD immediately  . Insulin Pen Needle 32G X 4 MM MISC 1 each by Other route as needed (insulin).   Marland Kitchen levalbuterol (XOPENEX HFA) 45 MCG/ACT inhaler Inhale 2 puffs into the lungs every 8 (eight) hours as needed for wheezing.  . lidocaine-prilocaine (EMLA) cream Apply 1 application topically 3 (three) times a week. 30 minutes prior to Dialysis - MWF  . oxyCODONE-acetaminophen (PERCOCET/ROXICET) 5-325 MG tablet Take  1 tablet by mouth every 6 (six) hours as needed for moderate pain.  . predniSONE (DELTASONE) 5 MG tablet Take 5 mg by mouth daily with breakfast.  . senna-docusate (SENOKOT-S) 8.6-50 MG tablet Take 1 tablet by mouth 2 (two) times daily. Hold if diarrhea  . sevelamer carbonate (RENVELA) 800 MG tablet Take 800 mg by mouth 3 (three) times daily with meals.  . warfarin (COUMADIN) 6 MG tablet Take 6-9 mg by mouth See admin instructions. Take 9 mg on Tues, Thurs, and Sun. Take 6 mg on Sun, Mon, Wed, Fri, and Sat   No facility-administered encounter medications on file as of 11/12/2020.   ROS  General: NAD EYES: No vision changes ENMT: No dysphagia no xerostomia Cardiovascular: No chest pain Pulmonary: No cough, SOB  Abdomen:no constipation or diarrhea GU: No dysuria or urinary frequency MSK:   ROM limitations, no falls reported Skin: No rashes or wounds Neurological: weakness Psych:  positive mood Heme/lymph/immuno: No bruises or abnormal bleeding   PHYSICAL EXAM  Constitutional: In no acute distress, well developed and well nourished Cardiovascular: regular rate and rhythm Pulmonary: no cough, no increased work of breathing, normal respiratory effort Abdomen: soft, non tender, positive bowel sounds in all quadrants GU:  no suprapubic  tenderness Eyes: Normal lids, no discharge, sclera anicteric ENMT: Moist mucous membranes Musculoskeletal: Right BKA Skin: no rash to visible skin, dry skin,warm without cyanosis Psych: non-anxious affect Neurological: Weakness but otherwise non focal Heme/lymph/immuno: no bruises, no bleeding  Thank you for the opportunity to participate in the care of [place]. Please call our office at [phone] if we can be of additional assistance.  Note: Portions of this note were generated with [employer]. Dictation errors may occur despite best attempts at proofreading.  Teodoro Spray, NP

## 2020-11-24 ENCOUNTER — Ambulatory Visit (INDEPENDENT_AMBULATORY_CARE_PROVIDER_SITE_OTHER): Payer: Medicare Other | Admitting: Physician Assistant

## 2020-11-24 ENCOUNTER — Other Ambulatory Visit: Payer: Self-pay

## 2020-11-24 VITALS — BP 157/83 | HR 89 | Temp 98.3°F | Resp 20

## 2020-11-24 DIAGNOSIS — I70235 Atherosclerosis of native arteries of right leg with ulceration of other part of foot: Secondary | ICD-10-CM

## 2020-11-24 DIAGNOSIS — Z89511 Acquired absence of right leg below knee: Secondary | ICD-10-CM

## 2020-11-24 NOTE — Progress Notes (Signed)
POST OPERATIVE OFFICE NOTE    CC:  F/u for surgery; 3 weeks post-op  HPI:  This is a 70 y.o. male who is s/p right BKA . He was released from rehab today. Pain much improved. His wife accompanies him. No fever or chills. He is compliant with Plavix, aspirin, statin.   08/05/20: R SFA stenting 08/07/20: R iliofemoral endarterectomy and fifth toe ray amputation 09/09/20: R external iliac stenting and popliteal angioplasty 10/08/20: debridement of right fifth toe amputation ulceration 11/02/20: right below knee amputation  Last arterial duplex: 09/22/20  No Known Allergies  Current Outpatient Medications  Medication Sig Dispense Refill  . allopurinol (ZYLOPRIM) 100 MG tablet Take 100 mg by mouth at bedtime.    Marland Kitchen aspirin 81 MG chewable tablet Chew 81 mg by mouth daily.     Marland Kitchen atorvastatin (LIPITOR) 40 MG tablet Take 40 mg by mouth daily.    Marland Kitchen b complex-vitamin c-folic acid (NEPHRO-VITE) 0.8 MG TABS tablet Take 1 tablet by mouth daily.    . calcitRIOL (ROCALTROL) 0.5 MCG capsule Take 2 capsules (1 mcg total) by mouth every Monday, Wednesday, and Friday.    . clopidogrel (PLAVIX) 75 MG tablet TAKE 1 TABLET (75 MG TOTAL) BY MOUTH DAILY WITH BREAKFAST. 30 tablet 3  . feeding supplement (BOOST HIGH PROTEIN) LIQD Take 1 Container by mouth daily as needed (nutrition).    . insulin aspart (NOVOLOG) 100 UNIT/ML injection Insulin sliding scale: Blood sugar  120-150   3units                       151-200   4units                       201-250   7units                       251- 300  11units                       301-350   15uints                       351-400   20units                       >400         call MD immediately 10 mL 0  . Insulin Pen Needle 32G X 4 MM MISC 1 each by Other route as needed (insulin).     Marland Kitchen levalbuterol (XOPENEX HFA) 45 MCG/ACT inhaler Inhale 2 puffs into the lungs every 8 (eight) hours as needed for wheezing. 1 Inhaler 12  . lidocaine-prilocaine (EMLA) cream Apply 1  application topically 3 (three) times a week. 30 minutes prior to Dialysis - MWF  11  . oxyCODONE-acetaminophen (PERCOCET/ROXICET) 5-325 MG tablet Take 1 tablet by mouth every 6 (six) hours as needed for moderate pain. 5 tablet 0  . predniSONE (DELTASONE) 5 MG tablet Take 5 mg by mouth daily with breakfast.    . senna-docusate (SENOKOT-S) 8.6-50 MG tablet Take 1 tablet by mouth 2 (two) times daily. Hold if diarrhea    . sevelamer carbonate (RENVELA) 800 MG tablet Take 800 mg by mouth 3 (three) times daily with meals.    . warfarin (COUMADIN) 6 MG tablet Take 6-9 mg by mouth See admin instructions. Take 9 mg on Tues, Thurs, and Sun. Take 6 mg  on Sun, Mon, Wed, Fri, and Sat     No current facility-administered medications for this visit.     ROS:  See HPI  Vitals:   11/24/20 1146  BP: (!) 157/83  Pulse: 89  Resp: 20  Temp: 98.3 F (36.8 C)  SpO2: 100%    Physical Exam:  General appearance: in NAD Respiratory: nonlabored Incision:  Well approximated Extremities:  RLE: minimal edema; flap are warm and well perfused Neuro: A and O    Assessment/Plan:  This is a 70 y.o. male who is s/p: Right below the knee amputation.  Incision line intact.  No signs of infection.  Flaps are warm and well-perfused.  We will discontinue nylon sutures today.  His wife is desirous of following up with exclusively with Dr. Stanford Breed. Follow-up in one week for staple removal.  Risa Grill, PA-C [facility] [phone]  Clinic MD:  Stanford Breed

## 2020-11-29 NOTE — Progress Notes (Signed)
[facility] [place]  ASSESSMENT / PLAN: Jonathan Olander Sr. is a 70 y.o. 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 His wound is healing well. Staples removed. Follow up with me in next 3-6 months to check on his progress.  CHIEF COMPLAINT: status post right below knee amputation  HISTORY OF PRESENT ILLNESS: Jonathan Lisiecki Sr. is a 70 y.o. male who I know very well who returns for a wound check. Over the winter of 2021-2022, we tried to salvage his right foot. Unfortunately, despite successful revascularization, he required a right below knee amputation in March 2022. He is recovering from this well. He presents today with his wife for staple removal.   VASCULAR SURGICAL HISTORY:  08/05/20: R SFA stenting 08/07/20: R iliofemoral endarterectomy and fifth toe ray amputation 09/09/20: R external iliac stenting and popliteal angioplasty 10/08/20: debridement of right fifth toe amputation ulceration 11/02/20: R below knee amputation  Past Medical History:  Diagnosis Date  . Diabetes mellitus without complication ([place])    type 2  . FUO (fever of unknown origin) 09/17/2019  . Hypertension   . Peripheral arterial disease ([place])   . Renal disorder 2015   right kidney transplant  . Stroke [facility])     Past Surgical History:  Procedure Laterality Date  . ABDOMINAL AORTOGRAM N/A 08/05/2020   Procedure: ABDOMINAL AORTOGRAM;  Surgeon: [name], MD;  Location: [place] CV LAB;  Service: Cardiovascular;  Laterality: N/A;  . ABDOMINAL AORTOGRAM W/LOWER EXTREMITY N/A 09/09/2020   Procedure: ABDOMINAL AORTOGRAM W/LOWER EXTREMITY;  Surgeon: [name], MD;  Location: [place] CV LAB;  Service: Cardiovascular;  Laterality: N/A;  . AMPUTATION Right 08/06/2020   Procedure: Right Fifth Toe Ray Amputation;  Surgeon: [name], MD;  Location: [place];  Service: Vascular;  Laterality: Right;  . AMPUTATION  Right 11/02/2020   Procedure: RIGHT AMPUTATION BELOW KNEE;  Surgeon: [name], MD;  Location: [place];  Service: Vascular;  Laterality: Right;  . BACK SURGERY     Has had 2 back surgeries  . BUBBLE STUDY  09/18/2019   Procedure: BUBBLE STUDY;  Surgeon: Buford Dresser, MD;  Location: [facility] ENDOSCOPY;  Service: Cardiovascular;;  . Depression    . ENDARTERECTOMY FEMORAL Right 08/06/2020   Procedure: Right Ilio- Femoral Artery Endarterectomy;  Surgeon: [name], MD;  Location: [place];  Service: Vascular;  Laterality: Right;  . HAND SURGERY Right   . HAND SURGERY    . IR REMOVAL TUN CV CATH W/O FL  09/03/2019  . KIDNEY TRANSPLANT     November 2015  . LOWER EXTREMITY ANGIOGRAPHY Bilateral 08/05/2020   Procedure: LOWER EXTREMITY ANGIOGRAPHY;  Surgeon: [name], MD;  Location: [place] CV LAB;  Service: Cardiovascular;  Laterality: Bilateral;  . Memory loss    . NEPHRECTOMY TRANSPLANTED ORGAN    . PATCH ANGIOPLASTY Right 08/06/2020   Procedure: PATCH ANGIOPLASTY;  Surgeon: [name], MD;  Location: [place];  Service: Vascular;  Laterality: Right;  . PERIPHERAL VASCULAR BALLOON ANGIOPLASTY Right 09/09/2020   Procedure: PERIPHERAL VASCULAR BALLOON ANGIOPLASTY;  Surgeon: [name], MD;  Location: [place] CV LAB;  Service: Cardiovascular;  Laterality: Right;  popliteal  . PERIPHERAL VASCULAR INTERVENTION Right 08/05/2020   Procedure: PERIPHERAL VASCULAR INTERVENTION;  Surgeon: [name], MD;  Location: [place] CV LAB;  Service: Cardiovascular;  Laterality: Right;  SFA  . PERIPHERAL VASCULAR INTERVENTION Right 09/09/2020  Procedure: PERIPHERAL VASCULAR INTERVENTION;  Surgeon: [name], MD;  Location: [place] CV LAB;  Service: Cardiovascular;  Laterality: Right;  external iliac  . TEE WITHOUT CARDIOVERSION N/A 09/18/2019   Procedure: TRANSESOPHAGEAL ECHOCARDIOGRAM (TEE);  Surgeon: Buford Dresser, MD;  Location: [facility] ENDOSCOPY;  Service:  Cardiovascular;  Laterality: N/A;  . WOUND DEBRIDEMENT Right 10/08/2020   Procedure: DEBRIDEMENT WOUND RIGHT FOOT;  Surgeon: [name], MD;  Location: [facility] OR;  Service: Vascular;  Laterality: Right;    Family History  Problem Relation Age of Onset  . Diabetes Mellitus II Mother   . Diabetes Mellitus II Brother     Social History   Socioeconomic History  . Marital status: Married    Spouse name: Not on file  . Number of children: Not on file  . Years of education: Not on file  . Highest education level: Not on file  Occupational History  . Not on file  Tobacco Use  . Smoking status: Former Research scientist (life sciences)  . Smokeless tobacco: Never Used  Vaping Use  . Vaping Use: Never used  Substance and Sexual Activity  . Alcohol use: Never  . Drug use: Never  . Sexual activity: Not on file  Other Topics Concern  . Not on file  Social History Narrative  . Not on file   Social Determinants of Health   Financial Resource Strain: Not on file  Food Insecurity: Not on file  Transportation Needs: Not on file  Physical Activity: Not on file  Stress: Not on file  Social Connections: Not on file  Intimate Partner Violence: Not on file    No Known Allergies  Current Outpatient Medications  Medication Sig Dispense Refill  . allopurinol (ZYLOPRIM) 100 MG tablet Take 100 mg by mouth at bedtime.    Marland Kitchen aspirin 81 MG chewable tablet Chew 81 mg by mouth daily.     Marland Kitchen atorvastatin (LIPITOR) 40 MG tablet Take 40 mg by mouth daily.    Marland Kitchen b complex-vitamin c-folic acid (NEPHRO-VITE) 0.8 MG TABS tablet Take 1 tablet by mouth daily.    . calcitRIOL (ROCALTROL) 0.5 MCG capsule Take 2 capsules (1 mcg total) by mouth every Monday, Wednesday, and Friday.    . clopidogrel (PLAVIX) 75 MG tablet TAKE 1 TABLET (75 MG TOTAL) BY MOUTH DAILY WITH BREAKFAST. 30 tablet 3  . feeding supplement (BOOST HIGH PROTEIN) LIQD Take 1 Container by mouth daily as needed (nutrition).    . insulin aspart (NOVOLOG) 100 UNIT/ML  injection Insulin sliding scale: Blood sugar  120-150   3units                       151-200   4units                       201-250   7units                       251- 300  11units                       301-350   15uints                       351-400   20units                       >400         call  MD immediately 10 mL 0  . Insulin Pen Needle 32G X 4 MM MISC 1 each by Other route as needed (insulin).     Marland Kitchen levalbuterol (XOPENEX HFA) 45 MCG/ACT inhaler Inhale 2 puffs into the lungs every 8 (eight) hours as needed for wheezing. 1 Inhaler 12  . lidocaine-prilocaine (EMLA) cream Apply 1 application topically 3 (three) times a week. 30 minutes prior to Dialysis - MWF  11  . oxyCODONE-acetaminophen (PERCOCET/ROXICET) 5-325 MG tablet Take 1 tablet by mouth every 6 (six) hours as needed for moderate pain. 5 tablet 0  . predniSONE (DELTASONE) 5 MG tablet Take 5 mg by mouth daily with breakfast.    . senna-docusate (SENOKOT-S) 8.6-50 MG tablet Take 1 tablet by mouth 2 (two) times daily. Hold if diarrhea    . sevelamer carbonate (RENVELA) 800 MG tablet Take 800 mg by mouth 3 (three) times daily with meals.    . warfarin (COUMADIN) 6 MG tablet Take 6-9 mg by mouth See admin instructions. Take 9 mg on Tues, Thurs, and Sun. Take 6 mg on Sun, Mon, Wed, Fri, and Sat     No current facility-administered medications for this visit.    REVIEW OF SYSTEMS:  '[X]'$  denotes positive finding, '[ ]'$  denotes negative finding Cardiac  Comments:  Chest pain or chest pressure:    Shortness of breath upon exertion:    Short of breath when lying flat:    Irregular heart rhythm:        Vascular    Pain in calf, thigh, or hip brought on by ambulation:    Pain in feet at night that wakes you up from your sleep:     Blood clot in your veins:    Leg swelling:         Pulmonary    Oxygen at home:    Productive cough:     Wheezing:         Neurologic    Sudden weakness in arms or legs:     Sudden numbness in arms or  legs:     Sudden onset of difficulty speaking or slurred speech:    Temporary loss of vision in one eye:     Problems with dizziness:         Gastrointestinal    Blood in stool:     Vomited blood:         Genitourinary    Burning when urinating:     Blood in urine:        Psychiatric    Major depression:         Hematologic    Bleeding problems:    Problems with blood clotting too easily:        Skin    Rashes or ulcers:        Constitutional    Fever or chills:      PHYSICAL EXAM  Vitals:   12/01/20 1001  BP: 139/75  Pulse: 83  Resp: 20  Temp: (!) 97.3 F (36.3 C)  SpO2: 98%  Weight: 135 lb (61.2 kg)  Height: 6' (1.829 m)    Constitutional: chronically ill appearing. no distress. Appears well nourished.  Neurologic: CN intact. Unchanged expressive aphasia Psychiatric: Mood and affect symmetric and appropriate. Eyes: No icterus. No conjunctival pallor. Ears, nose, throat: mucous membranes moist. Midline trachea.  Cardiac: regular rate and rhythm.  Respiratory: unlabored. Abdominal: soft, non-tender, non-distended.  Peripheral vascular:  R BKA healing well Extremity: No edema. No cyanosis. No pallor.  Skin: No gangrene. No ulceration.  Lymphatic: No Stemmer's sign. No palpable lymphadenopathy.  PERTINENT LABORATORY AND RADIOLOGIC DATA  Most recent CBC CBC Latest Ref Rng & Units 11/06/2020 11/05/2020 11/04/2020  WBC 4.0 - 10.5 K/uL 8.6 8.4 9.7  Hemoglobin 13.0 - 17.0 g/dL 7.7(L) 8.3(L) 8.1(L)  Hematocrit 39.0 - 52.0 % 24.1(L) 26.9(L) 25.3(L)  Platelets 150 - 400 K/uL 240 219 223     Most recent CMP CMP Latest Ref Rng & Units 11/06/2020 11/05/2020 11/04/2020  Glucose 70 - 99 mg/dL 155(H) 145(H) 30(LL)  BUN 8 - 23 mg/dL 41(H) 26(H) 27(H)  Creatinine 0.61 - 1.24 mg/dL 6.88(H) 4.99(H) 5.00(H)  Sodium 135 - 145 mmol/L 132(L) 134(L) 136  Potassium 3.5 - 5.1 mmol/L 4.4 4.1 3.9  Chloride 98 - 111 mmol/L 98 97(L) 96(L)  CO2 22 - 32 mmol/L '27 28 29  '$ Calcium 8.9 -  10.3 mg/dL 8.8(L) 9.1 8.5(L)  Total Protein 6.5 - 8.1 g/dL - - -  Total Bilirubin 0.3 - 1.2 mg/dL - - -  Alkaline Phos 38 - 126 U/L - - -  AST 15 - 41 U/L - - -  ALT 0 - 44 U/L - - -    Renal function CrCl cannot be calculated (Patient's most recent lab result is older than the maximum 21 days allowed.).  Hgb A1c MFr Bld (%)  Date Value  10/30/2020 5.7 (H)    LDL Cholesterol  Date Value Ref Range Status  08/07/2020 43 0 - 99 mg/dL Final    Comment:           Total Cholesterol/HDL:CHD Risk Coronary Heart Disease Risk Table                     Men   Women  1/2 Average Risk   3.4   3.3  Average Risk       5.0   4.4  2 X Average Risk   9.6   7.1  3 X Average Risk  23.4   11.0        Use the calculated Patient Ratio above and the CHD Risk Table to determine the patient's CHD Risk.        ATP III CLASSIFICATION (LDL):  <100     mg/dL   Optimal  100-129  mg/dL   Near or Above                    Optimal  130-159  mg/dL   Borderline  160-189  mg/dL   High  >190     mg/dL   Very High Performed at [facility] [address] [place], [place] [address]     [name], MD [facility] Phone Number: [phone] 11/29/2020 9:44 AM

## 2020-12-01 ENCOUNTER — Ambulatory Visit: Payer: Medicare Other | Admitting: Vascular Surgery

## 2020-12-01 ENCOUNTER — Encounter: Payer: Self-pay | Admitting: Vascular Surgery

## 2020-12-01 ENCOUNTER — Other Ambulatory Visit: Payer: Self-pay

## 2020-12-01 VITALS — BP 139/75 | HR 83 | Temp 97.3°F | Resp 20 | Ht 72.0 in | Wt 135.0 lb

## 2020-12-01 DIAGNOSIS — Z89511 Acquired absence of right leg below knee: Secondary | ICD-10-CM

## 2020-12-01 DIAGNOSIS — I70235 Atherosclerosis of native arteries of right leg with ulceration of other part of foot: Secondary | ICD-10-CM

## 2020-12-08 NOTE — Progress Notes (Signed)
Patient arrived with wife with one visible staple in his right bka. Removed staple - small open area - no swelling, redness or drainage. Advised to paint with betadine and keep covered. Patient tolerated staple removal without difficulty.

## 2020-12-15 ENCOUNTER — Encounter: Payer: Medicare Other | Admitting: Physical Therapy

## 2021-01-26 ENCOUNTER — Encounter: Payer: Medicare Other | Admitting: Physical Therapy

## 2021-02-01 ENCOUNTER — Ambulatory Visit: Payer: Medicare Other | Admitting: Interventional Cardiology

## 2021-02-02 ENCOUNTER — Ambulatory Visit (INDEPENDENT_AMBULATORY_CARE_PROVIDER_SITE_OTHER): Payer: Medicare Other | Admitting: Neurology

## 2021-02-02 ENCOUNTER — Telehealth: Payer: Self-pay | Admitting: Neurology

## 2021-02-02 ENCOUNTER — Encounter: Payer: Self-pay | Admitting: Neurology

## 2021-02-02 ENCOUNTER — Other Ambulatory Visit: Payer: Self-pay

## 2021-02-02 VITALS — BP 137/86 | HR 81 | Ht 71.0 in | Wt 160.0 lb

## 2021-02-02 DIAGNOSIS — Z8673 Personal history of transient ischemic attack (TIA), and cerebral infarction without residual deficits: Secondary | ICD-10-CM

## 2021-02-02 DIAGNOSIS — F0150 Vascular dementia without behavioral disturbance: Secondary | ICD-10-CM | POA: Diagnosis not present

## 2021-02-02 DIAGNOSIS — R413 Other amnesia: Secondary | ICD-10-CM

## 2021-02-02 NOTE — Progress Notes (Signed)
[facility] [address] [place]. [place] [address] [phone]       OFFICE CONSULT NOTE  Mr. Jonathan Mcannally Sr. Date of Birth:  [birth year] Medical Record Number:  [id number]   Referring MD: Jonathan [name]  Reason for Referral: Dementia  HPI: [name] is a 70 year old African-American male with past medical history of diabetes, hypertension, renal failure s/p kidney transplant on dialysis, strokes in December 2020, right below-knee amputation.  He is seen today for initial office consultation visit for dementia.  Is accompanied by his wife.  History is obtained from them and review of electronic medical records and reviewed pertinent available imaging films in PACS.  Patient has had memory and cognitive difficulties ever since his strokes in December 2020.  Over the last couple of years the wife feels he is difficulties may be progressing.  He has trouble remembering new information and needs constant reminders.  He can remember things in the past better.  He is able to feed himself but needs help with medications.  He had osteomyelitis in his right foot requiring amputation and prolonged hospitalization from March 25 to April 1 this year.  He has had not been able to ambulate since then and spends most of the time in the wheelchair.  He has not yet been fitted with a prosthesis.  He denies any headache, seizures, new stroke or TIA symptoms.  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  His blood pressure is under good control today it is 137/86.  He is tolerating Lipitor well without muscle aches and pains.  ROS:   14 system review of systems is positive for memory loss, confusion, weakness, difficulty walking, foot ulcer, leg amputation all other systems negative  PMH:  Past Medical History:  Diagnosis Date   CAD  (coronary artery disease)    CHF (congestive heart failure) ([name])    Diabetes mellitus without complication ([place])    type 2   DM (diabetes mellitus) ([place])    ESRD (end stage renal disease) ([name])    FUO (fever of unknown origin) 09/17/2019   Hyperlipidemia    Hypertension    Osteomyelitis ([place])    Peripheral arterial disease ([name])    PVD (peripheral vascular disease) ([place])    Renal disorder 2015   right kidney transplant   Stroke ([place])    Vascular dementia ([place])     Social History:  Social History   Socioeconomic History   Marital status: Married    Spouse name: [name]   Number of children: Not on file   Years of education: Not on file   Highest education level: Not on file  Occupational History   Not on file  Tobacco Use   Smoking status: Former    Pack years: 0.00   Smokeless tobacco: Never  Vaping Use   Vaping Use: Never used  Substance and Sexual Activity   Alcohol use: Never   Drug use: Never   Sexual activity: Not on file  Other Topics Concern   Not on file  Social History Narrative   Lives with wife, son, daughter in law and grandson   Right Handed   Drinks 1 cup caffeine daily   Social Determinants of Health   Financial Resource Strain: Not on file  Food Insecurity: Not on file  Transportation Needs: Not on file  Physical Activity: Not on file  Stress: Not on file  Social Connections: Not on file  Intimate Partner Violence: Not on file    Medications:   Current Outpatient Medications on File Prior to Visit  Medication Sig Dispense Refill   allopurinol (ZYLOPRIM) 100 MG tablet Take 100 mg by mouth at bedtime.     apixaban (ELIQUIS) 5 MG TABS tablet Take 5 mg by mouth 2 (two) times daily.     aspirin 81 MG chewable tablet Chew 81 mg by mouth daily.      atorvastatin (LIPITOR) 40 MG tablet Take 40 mg by mouth daily.     b complex-vitamin c-folic acid (NEPHRO-VITE) 0.8 MG TABS tablet Take 1 tablet by mouth daily.     feeding supplement (BOOST HIGH  PROTEIN) LIQD Take 1 Container by mouth daily as needed (nutrition).     insulin aspart (NOVOLOG) 100 UNIT/ML injection Insulin sliding scale: Blood sugar  120-150   3units                       151-200   4units                       201-250   7units                       251- 300  11units                       301-350   15uints                       351-400   20units                       >400         call MD immediately 10 mL 0   Insulin Pen Needle 32G X 4 MM MISC 1 each by Other route as needed (insulin).      levalbuterol (XOPENEX HFA) 45 MCG/ACT inhaler Inhale 2 puffs into the lungs every 8 (eight) hours as needed for wheezing. 1 Inhaler 12   lidocaine-prilocaine (EMLA) cream Apply 1 application topically 3 (three) times a week. 30 minutes prior to Dialysis - MWF  11   oxyCODONE-acetaminophen (PERCOCET/ROXICET) 5-325 MG tablet Take 1 tablet by mouth every 6 (six) hours as needed for moderate pain. 5 tablet 0   predniSONE (DELTASONE) 5 MG tablet Take 5 mg by mouth daily with breakfast.     senna-docusate (SENOKOT-S) 8.6-50 MG tablet Take 1 tablet by mouth 2 (two) times daily. Hold if diarrhea     sevelamer carbonate (RENVELA) 800 MG tablet Take 800 mg by mouth 3 (three) times daily with meals.     calcitRIOL (ROCALTROL) 0.5 MCG capsule Take 2 capsules (1 mcg total) by mouth every Monday, Wednesday, and Friday.     clopidogrel (PLAVIX) 75 MG tablet TAKE 1 TABLET (75 MG TOTAL) BY MOUTH DAILY WITH BREAKFAST. 30 tablet 3   warfarin (COUMADIN) 6 MG tablet Take 6-9 mg by mouth See admin instructions. Take 9 mg on Tues, Thurs, and Sun. Take 6 mg on Sun, Mon, Wed, Fri, and Sat     No current facility-administered medications on file prior to visit.    Allergies:  No Known Allergies  Physical Exam General: Frail middle-aged African-American male not in distress., seated, in no evident distress Head: head normocephalic  and atraumatic.   Neck: supple with no carotid or supraclavicular  bruits Cardiovascular: regular rate and rhythm, soft ejection systolic murmur heard throughout. Musculoskeletal: no deformity has right below-knee amputation Skin:  no rash/petichiae Vascular:  Normal pulses all extremities dialysis fistula right arm.  Neurologic Exam Mental Status: Awake and fully alert. Oriented to place and time. Recent and remote memory i diminished.  Recall 0/3.  Attention span, concentration and fund of diminished appropriate. Mood and affect appropriate.  Mini-Mental status exam score 10/30 with deficits in orientation, attention, calculation, recall, reading and writing.  Able to name only 2 animals which can walk on 4 legs.  Unable to draw clock Cranial Nerves: Fundoscopic exam not done. Pupils equal, briskly reactive to light. Extraocular movements full without nystagmus. Visual fields full to confrontation. Hearing intact. Facial sensation intact.  Mild left lower facial asymmetry., tongue, palate moves normally and symmetrically.  Motor: Normal bulk and tone.  Mild weakness of left grip and intrinsic hand muscles.  Orbits right over left upper extremity.  Mild weakness of left hip flexors and ankle dorsiflexors.  Right leg amputated below knee Sensory.: intact to touch , pinprick , position and vibratory sensation.  Coordination: Rapid alternating movements normal in all extremities. Finger-to-nose and heel-to-shin performed accurately bilaterally. Gait and Station: Arises from chair without difficulty. Stance is normal. Gait demonstrates normal stride length and balance . Able to heel, toe and tandem walk without difficulty.  Reflexes: 1+ and symmetric. Toes downgoing.   NIHSS  3 Modified [place]  4   ASSESSMENT: 70 year old male with longstanding progressive memory and cognitive impairment following strokes in December 2020 likely from vascular dementia.  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  Vascular risk factors of  diabetes, hyperlipidemia, hypertension cardiomyopathy.  And CHF     PLAN: I had a long discussion with the patient and his wife regarding his memory loss and cognitive impairment following stroke discussed evaluation and treatment plan.Recommend check memory panel labs, EEG and MRI brain. I encouraged him to particpate in cognitively challenging activities like solving crossword puzzles, playing sudoku or board games. We also discussed memory compensation strategies..Continue aspirin 81 mg daily  for secondary stroke prevention and maintain strict control of hypertension with blood pressure goal below 130/90, diabetes with hemoglobin A1c goal below 6.5% and lipids with LDL cholesterol goal below 70 mg/dL. I also advised the patient to eat a healthy diet with plenty of whole grains, cereals, fruits and vegetables, exercise regularly and maintain ideal body weight Followup in the future with me in 2 months or call earlier if necessary.I have spent a total of  45  minutes with the patient reviewing hospital notes,  test results, labs and examining the patient as well as establishing an assessment and plan that was discussed personally with the patient.  > 50% of time was spent in direct patient care.      Antony Contras, MD Note: This document was prepared with digital dictation and possible smart phrase technology. Any transcriptional errors that result from this process are unintentional.

## 2021-02-02 NOTE — Patient Instructions (Addendum)
I had a long discussion with the patient and his wife regarding his memory loss and cognitive impairment following stroke discussed evaluation and treatment plan.Recommend check memory panel labs, EEG and MRI brain. I encouraged him to particpate in cognitively challenging activities like solving crossword puzzles, playing sudoku or board games. We also discussed memory compensation strategies..Continue aspirin 81 mg daily  for secondary stroke prevention and maintain strict control of hypertension with blood pressure goal below 130/90, diabetes with hemoglobin A1c goal below 6.5% and lipids with LDL cholesterol goal below 70 mg/dL. I also advised the patient to eat a healthy diet with plenty of whole grains, cereals, fruits and vegetables, exercise regularly and maintain ideal body weight Followup in the future with me in 2 months or call earlier if necessary. Memory Compensation Strategies  Use "WARM" strategy.  W= write it down  A= associate it  R= repeat it  M= make a mental note  2.   You can keep a Social worker.  Use a 3-ring notebook with sections for the following: calendar, important names and phone numbers,  medications, doctors' names/phone numbers, lists/reminders, and a section to journal what you did  each day.   3.    Use a calendar to write appointments down.  4.    Write yourself a schedule for the day.  This can be placed on the calendar or in a separate section of the Memory Notebook.  Keeping a  regular schedule can help memory.  5.    Use medication organizer with sections for each day or morning/evening pills.  You may need help loading it  6.    Keep a basket, or pegboard by the door.  Place items that you need to take out with you in the basket or on the pegboard.  You may also want to  include a message board for reminders.  7.    Use sticky notes.  Place sticky notes with reminders in a place where the task is performed.  For example: " turn off the  stove" placed  by the stove, "lock the door" placed on the door at eye level, " take your medications" on  the bathroom mirror or by the place where you normally take your medications.  8.    Use alarms/timers.  Use while cooking to remind yourself to check on food or as a reminder to take your medicine, or as a  reminder to make a call, or as a reminder to perform another task, etc.

## 2021-02-02 NOTE — Telephone Encounter (Signed)
MRI brain wo contrast [employer] auth pending- OV notes faxed 02/02/21

## 2021-02-11 ENCOUNTER — Encounter: Payer: Medicare Other | Admitting: Physical Therapy

## 2021-02-11 ENCOUNTER — Telehealth: Payer: Self-pay | Admitting: Neurology

## 2021-02-11 NOTE — Telephone Encounter (Signed)
I lvm for pt letting him know I am sending the EEG order to [facility] and they will be reaching out to schedule.

## 2021-03-02 NOTE — Progress Notes (Signed)
ADDENDUM :  Received labs from [facility] labs ordered by Dr. Pearson Grippe on this patient from 02/18/2021.  Vitamin B12 level was normal at 790 and TSH is 0.625.  CBC and blood chemistries were slightly abnormal and Dr. Joelyn Oms to address these.  Antony Contras, MD

## 2021-03-11 ENCOUNTER — Encounter: Payer: Self-pay | Admitting: Physical Therapy

## 2021-03-11 ENCOUNTER — Other Ambulatory Visit: Payer: Self-pay

## 2021-03-11 ENCOUNTER — Ambulatory Visit (INDEPENDENT_AMBULATORY_CARE_PROVIDER_SITE_OTHER): Payer: Medicare Other | Admitting: Physical Therapy

## 2021-03-11 DIAGNOSIS — R2681 Unsteadiness on feet: Secondary | ICD-10-CM | POA: Diagnosis not present

## 2021-03-11 DIAGNOSIS — R293 Abnormal posture: Secondary | ICD-10-CM | POA: Diagnosis not present

## 2021-03-11 DIAGNOSIS — M6281 Muscle weakness (generalized): Secondary | ICD-10-CM

## 2021-03-11 DIAGNOSIS — R2689 Other abnormalities of gait and mobility: Secondary | ICD-10-CM | POA: Diagnosis not present

## 2021-03-11 NOTE — Therapy (Signed)
[facility] [address] [place], [place], [address] Phone: [phone]   Fax:  [phone]  Physical Therapy Evaluation  Patient Details  Name: Jonathan BIEGLER Sr. MRN: [id number] Date of Birth: 1951/04/26 Referring Provider (PT): Jamelle Haring, MD   Encounter Date: 03/11/2021   PT End of Session - 03/11/21 1247     Visit Number 1    Number of Visits 25    Date for PT Re-Evaluation 06/10/21    Authorization Type BCBS Medicare    Authorization Time Period OOP & Ded met, $0 co-pay    Progress Note Due on Visit 10    PT Start Time 1109    PT Stop Time 1145    PT Time Calculation (min) 36 min    Equipment Utilized During Treatment Gait belt    Activity Tolerance Patient tolerated treatment well;Other (comment)   limited by cognitive deficits / memory   Behavior During Therapy WFL for tasks assessed/performed             Past Medical History:  Diagnosis Date   CAD (coronary artery disease)    CHF (congestive heart failure) ([name])    Diabetes mellitus without complication ([place])    type 2   DM (diabetes mellitus) ([name])    ESRD (end stage renal disease) ([name])    FUO (fever of unknown origin) 09/17/2019   Hyperlipidemia    Hypertension    Osteomyelitis ([place])    Peripheral arterial disease ([name])    PVD (peripheral vascular disease) ([place])    Renal disorder 2015   right kidney transplant   Stroke [facility])    Vascular dementia [facility])     Past Surgical History:  Procedure Laterality Date   ABDOMINAL AORTOGRAM N/A 08/05/2020   Procedure: ABDOMINAL AORTOGRAM;  Surgeon: [name], MD;  Location: [place] CV LAB;  Service: Cardiovascular;  Laterality: N/A;   ABDOMINAL AORTOGRAM W/LOWER EXTREMITY N/A 09/09/2020   Procedure: ABDOMINAL AORTOGRAM W/LOWER EXTREMITY;  Surgeon: [name], MD;  Location: [place] CV [facility];  Service: Cardiovascular;  Laterality: N/A;   AMPUTATION Right 08/06/2020   Procedure: Right Fifth Toe Ray  Amputation;  Surgeon: [name], MD;  Location: [place];  Service: Vascular;  Laterality: Right;   AMPUTATION Right 11/02/2020   Procedure: RIGHT AMPUTATION BELOW KNEE;  Surgeon: [name], MD;  Location: [place];  Service: Vascular;  Laterality: Right;   BACK SURGERY     Has had 2 back surgeries   BUBBLE STUDY  09/18/2019   Procedure: BUBBLE STUDY;  Surgeon: Buford Dresser, MD;  Location: [place];  Service: Cardiovascular;;   Depression     ENDARTERECTOMY FEMORAL Right 08/06/2020   Procedure: Right Ilio- Femoral Artery Endarterectomy;  Surgeon: [name], MD;  Location: [facility] OR;  Service: Vascular;  Laterality: Right;   HAND SURGERY Right    HAND SURGERY     IR REMOVAL TUN CV CATH W/O [facility]  09/03/2019   KIDNEY TRANSPLANT     November 2015   LEG AMPUTATION BELOW KNEE Right    LOWER EXTREMITY ANGIOGRAPHY Bilateral 08/05/2020   Procedure: LOWER EXTREMITY ANGIOGRAPHY;  Surgeon: [name], MD;  Location: [place] CV LAB;  Service: Cardiovascular;  Laterality: Bilateral;   Memory loss     NEPHRECTOMY TRANSPLANTED ORGAN     PATCH ANGIOPLASTY Right 08/06/2020   Procedure: PATCH ANGIOPLASTY;  Surgeon: [name], MD;  Location: [place];  Service: Vascular;  Laterality: Right;   PERIPHERAL  VASCULAR BALLOON ANGIOPLASTY Right 09/09/2020   Procedure: PERIPHERAL VASCULAR BALLOON ANGIOPLASTY;  Surgeon: [name], MD;  Location: [place] CV LAB;  Service: Cardiovascular;  Laterality: Right;  popliteal   PERIPHERAL VASCULAR INTERVENTION Right 08/05/2020   Procedure: PERIPHERAL VASCULAR INTERVENTION;  Surgeon: [name], MD;  Location: [place] [facility] LAB;  Service: Cardiovascular;  Laterality: Right;  SFA   PERIPHERAL VASCULAR INTERVENTION Right 09/09/2020   Procedure: PERIPHERAL VASCULAR INTERVENTION;  Surgeon: [name], MD;  Location: [place] CV LAB;  Service: Cardiovascular;  Laterality: Right;  external iliac   TEE WITHOUT  CARDIOVERSION N/A 09/18/2019   Procedure: TRANSESOPHAGEAL ECHOCARDIOGRAM (TEE);  Surgeon: Buford Dresser, MD;  Location: [facility] ENDOSCOPY;  Service: Cardiovascular;  Laterality: N/A;   WOUND DEBRIDEMENT Right 10/08/2020   Procedure: DEBRIDEMENT WOUND RIGHT FOOT;  Surgeon: [name], MD;  Location: [place];  Service: Vascular;  Laterality: Right;    There were no vitals filed for this visit.    Subjective Assessment - 03/11/21 1112     Subjective This 70yo male was referred to PT by Jamelle Haring, MD with [phone] (ICD-10-CM) - Status post below-knee amputation of right lower extremity which was performed on 11/02/2020 & I70.235 (ICD-10-CM) - Atherosclerosis of native arteries of right leg with ulceration of other part of foot. His prosthesis was delivered 03/02/2021.    Patient is accompained by: Family member   wife   Pertinent History rt TTA, DM2, HTN, PVD, ESRD w/dialysis, hx right kidney transplant Nov 2015, Stroke, back sg X 2, memory issues    Limitations Lifting;Walking;House hold activities    Patient Stated Goals use prosthesis to get around house & community, be able to get in & out of high truck. be able to go to bathroom without assistance.    Currently in Pain? No/denies                [facility] PT Assessment - 03/11/21 1110       Assessment   Medical Diagnosis Right Transtibial Amputation    Referring Provider (PT) Jamelle Haring, MD    Onset Date/Surgical Date 03/02/21   prosthesis delivery   Hand Dominance Right      Precautions   Precautions Fall;Other (comment)   NO BP LUE     Balance Screen   Has the patient fallen in the past 6 months No    Has the patient had a decrease in activity level because of a fear of falling?  Yes    Is the patient reluctant to leave their home because of a fear of falling?  Yes      [place] residence    Living Arrangements Spouse/significant other;Children   73yo son, dtr-law & 92yo  grandson   Type of [place] entrance   over 2 thresholds   Home Layout Two level;Able to live on main level with bedroom/bathroom;1/2 bath on main level   dining room converted to his bedroom   Alternate Level Stairs-Number of Steps 14    Alternate Level Stairs-Rails Right    Home Equipment Walker - 4 wheels;Walker - 2 wheels;Cane - single point;Bedside commode;Tub bench;Wheelchair - manual;Hospital bed      Prior Function   Level of Independence Independent with community mobility with device;Independent with household mobility with device   rollator walker   Vocation Retired      Investment banker, corporate Control Postural limitations  Postural Limitations Rounded Shoulders;Forward head;Flexed trunk;Weight shift left      ROM / Strength   AROM / PROM / Strength AROM;Strength      AROM   Overall AROM  Deficits    Overall AROM Comments knee ext LLE -15* seated      Strength   Overall Strength Deficits    Strength Assessment Site Hip;Knee;Ankle    Right Hip Flexion 3+/5    Right Hip Extension 3-/5   gross testing seated & functionally   Right Hip ABduction 3/5   gross testing seated & functionally   Left Hip Flexion 4/5    Left Hip Extension 3/5   gross testing seated & functionally   Left Hip ABduction 3+/5   gross testing seated & functionally   Right/Left Knee Right;Left    Right Knee Flexion 3/5   gross testing seated & functionally   Right Knee Extension 4-/5    Left Knee Flexion 4/5   gross testing seated & functionally   Left Knee Extension 4/5    Left Ankle Dorsiflexion 4/5      Transfers   Transfers Sit to Stand;Stand to Sit    Sit to Stand 4: Min assist;With upper extremity assist;With armrests;From chair/3-in-1;Other (comment);3: Mod assist   requires RW support, minA to arise & modA to stablize   Stand to Sit 4: Min guard;With upper extremity assist;With armrests;To chair/3-in-1;Uncontrolled descent;Other (comment)   RW  support required     Ambulation/Gait   Ambulation/Gait Yes    Ambulation/Gait Assistance 3: Mod assist    Ambulation/Gait Assistance Details excessive BUE support on RW,  PT had to control RW    Ambulation Distance (Feet) 30 Feet    Assistive device Rolling walker;Prosthesis    Gait Pattern Step-to pattern;Decreased step length - left;Decreased stance time - right;Decreased stride length;Decreased hip/knee flexion - right;Decreased weight shift to right;Right flexed knee in stance;Left flexed knee in stance;Antalgic;Lateral hip instability;Trunk flexed;Abducted- right    Ambulation Surface Level;Indoor      Balance   Balance Assessed Yes      Static Standing Balance   Static Standing - Balance Support Bilateral upper extremity supported   RW support   Static Standing - Level of Assistance 3: Mod assist;4: Min assist   ModA for positioning then MinA to maintain   Static Standing - Comment/# of Minutes 1 minute      Dynamic Standing Balance   Dynamic Standing - Balance Support Bilateral upper extremity supported;Left upper extremity supported   BUE for scanning & LUE support to reach with dominant RUE   Dynamic Standing - Level of Assistance 3: Mod assist;4: Min assist    Dynamic Standing - Balance Activities Eyes opn;Head nods;Head turns;Reaching for objects    Dynamic Standing - Comments reaches 2" with modA,  scans right/left/up/down with only head movements, minimal to no trunk movements with minA      Standardized Balance Assessment   Standardized Balance Assessment Berg Balance Test      Berg Balance Test   Sit to Stand Needs moderate or maximal assist to stand   Berg task with RW support = 0   Standing Unsupported Unable to stand 30 seconds unassisted   Berg task with RW support = 1   Sitting with Back Unsupported but Feet Supported on Floor or Stool Able to sit safely and securely 2 minutes    Stand to Sit Needs assistance to sit   Berg task with RW support = 1   Transfers  Able  to transfer with verbal cueing and /or supervision    Standing Unsupported with Eyes Closed Needs help to keep from falling   Berg task with RW support = 0   Standing Unsupported with Feet Together Needs help to attain position and unable to hold for 15 seconds   Berg task with RW support = 0   From Standing, Reach Forward with Outstretched Arm Loses balance while trying/requires external support   Berg task with RW support = 0   From Standing Position, Pick up Object from Floor Unable to try/needs assist to keep balance   Berg task with RW support = 0   From Standing Position, Turn to Look Behind Over each Shoulder Needs assist to keep from losing balance and falling   Berg task with RW support = 0   Turn 360 Degrees Needs assistance while turning   Berg task with RW support = 0   Standing Unsupported, Alternately Place Feet on Step/Stool Needs assistance to keep from falling or unable to try   Berg task with RW support = 0   Standing Unsupported, One Foot in [employer] balance while stepping or standing   Berg task with RW support = 0   Standing on One Leg Unable to try or needs assist to prevent fall   Berg task with RW support = 0   Total Score 6    Berg comment: task of Berg with RW support 8/56             Prosthetics Assessment - 03/11/21 1110       Prosthetics   Prosthetic Care Dependent with Skin check;Residual limb care;Care of non-amputated limb;Prosthetic cleaning;Ply sock cleaning;Correct ply sock adjustment;Proper wear schedule/adjustment;Proper weight-bearing schedule/adjustment    Donning prosthesis  +1 Total assist   wife assisting at home, but required PT instruction how   Doffing prosthesis  Min assist    Current prosthetic wear tolerance (days/week)  0 days since delivery 9 days ago. wife reports waiting on PT instructions.    Current prosthetic wear tolerance (#hours/day)  0 hrs prior to eval. 30 min during eval.  PT recomending 2hrs 2x/day on nondialysis days & 1x/day  after dialysis when out of bed.  PT plan to increase 5-7 days if no issues.    Current prosthetic weight-bearing tolerance (hours/day)  Patient tolerated standing for 5 min with partial weight on prosthesis with no c/o pain or discomfort.    Edema pitting    Residual limb condition  no open areas, moist skin with lotion present (PT instructed wife in need to remove lotion prior to donning prosthesis), normal color & temperature, cylinderical shape.    Prosthesis Description silicon liner with shuttle pin lock suspension, secondary pelite liner, SACH foot.                       Objective measurements completed on examination: See above findings.       [facility] Adult PT Treatment/Exercise - 03/11/21 1110       Prosthetics   Education Provided Skin check;Residual limb care;Prosthetic cleaning;Correct ply sock adjustment;Proper Donning;Proper wear schedule/adjustment    Person(s) Educated Spouse;Patient    Education Method Explanation;Demonstration;Tactile cues;Verbal cues    Education Method Verbalized understanding;Tactile cues required;Verbal cues required;Needs further instruction                      PT Short Term Goals - 03/11/21 1412       PT  SHORT TERM GOAL #1   Title Wife donnes prosthesis correctly & verbalizes proper cleaning.    Time 4    Period Weeks    Status New    Target Date 04/15/21      PT SHORT TERM GOAL #2   Title Patient tolerates prosthesis >8 hrs total on nondialysis days & >4 hours if out of bed on dialysis days without skin issues or limb pain    Time 4    Period Weeks    Status New    Target Date 04/15/21      PT SHORT TERM GOAL #3   Title Patient able to sit to & from stand w/c or chair with armrests to RW with supervision.    Time 4    Period Weeks    Status New    Target Date 04/15/21      PT SHORT TERM GOAL #4   Title Patient ambulates 100' with RW & prosthesis with minA.    Time 4    Period Weeks    Status New     Target Date 04/15/21      PT SHORT TERM GOAL #5   Title Patient able to negotiate curbs with RW with modA.    Time 4    Period Weeks    Status New    Target Date 04/15/21               PT Long Term Goals - 03/11/21 1408       PT LONG TERM GOAL #1   Title Patient's wife demonstrates & verbalized understanding of prosthetic care to enable safe utilization of prosthesis.    Time 12    Period Weeks    Status New    Target Date 06/10/21      PT LONG TERM GOAL #2   Title Patient tolerates prosthesis wear >90% of awake hours without skin or limb pain issues.    Time 12    Period Weeks    Status New    Target Date 06/10/21      PT LONG TERM GOAL #3   Title Patient able to perform standing ADLs like managing pants to toilet with RW & prosthesis with supervision.    Time 12    Period Weeks    Status New    Target Date 06/10/21      PT LONG TERM GOAL #4   Title Patient ambulates 200' with RW or rollator walker & prosthesis with wife's supervision.    Time 12    Period Weeks    Status New    Target Date 06/10/21      PT LONG TERM GOAL #5   Title Patient negotiates ramps & curbs with RW or rollator walker & prosthesis with minA from wife.    Time 12    Period Weeks    Status New    Target Date 06/10/21                    Plan - 03/11/21 1400     Clinical Impression Statement This 70yo male underwent a left Transtibial Amputation on 11/02/2020 and received his first prosthesis on 03/02/2021. He has memory & some congitive impairments but wife present who appears able to learn & reinforce PT instructions.  He has been limited in mobility for several months with resulting significant deconditioning including weakness & limited endurance / activity tolerance.  Patient & wife are dependent in proper prosthetic care which increases  risk of skin issues.  Patient has not worn prosthesis since delivery 9 days prior to PT evaluation. Patient's balance is impaired requiring  RW support with modA to stabilize or reach and minA to maintain upright or turn head to scan.  Patient's prosthetic gait requires moderate assistance, has significant deviations including excessive UE weight bearing indicating high fall risk. Patient would benefit from skilled PT to improve function & safety with her prosthesis.    Personal Factors and Comorbidities Comorbidity 3+;Fitness;Time since onset of injury/illness/exacerbation    Comorbidities rt TTA, DM2, HTN, PVD, ESRD w/dialysis, hx right kidney transplant Nov 2015, Stroke, back sg X 2, memory loss    Examination-Activity Limitations Stand;Transfers;Toileting;Stairs;Locomotion Level    Examination-Participation Restrictions Community Activity;Other   household mobility & ADLs   Stability/Clinical Decision Making Evolving/Moderate complexity    Clinical Decision Making Moderate    Rehab Potential Good    PT Frequency 2x / week    PT Duration 12 weeks    PT Treatment/Interventions ADLs/Self Care Home Management;DME Instruction;Gait training;Stair training;Functional mobility training;Therapeutic activities;Therapeutic exercise;Balance training;Neuromuscular re-education;Patient/family education;Prosthetic Training;Vestibular    PT Next Visit Plan review prosthetic care, HEP at sink under wife's supervision.    Consulted and Agree with Plan of Care Patient;Family member/caregiver    Family Member Consulted wife, Sayvon Arterberry             Patient will benefit from skilled therapeutic intervention in order to improve the following deficits and impairments:  Abnormal gait, Decreased activity tolerance, Decreased balance, Decreased endurance, Decreased knowledge of use of DME, Decreased mobility, Decreased strength, Increased edema, Impaired flexibility, Postural dysfunction, Prosthetic Dependency  Visit Diagnosis: Other abnormalities of gait and mobility  Unsteadiness on feet  Muscle weakness (generalized)  Abnormal  posture     Problem List Patient Active Problem List   Diagnosis Date Noted   Osteomyelitis ([place]) 10/30/2020   Encounter for general adult medical examination without abnormal findings 09/15/2020   Hemiplegia of dominant side ([place]) 09/15/2020   History of cerebrovascular accident 09/15/2020   History of COVID-19 09/15/2020   Hypercoagulable state ([name]) 09/15/2020   Other chronic osteomyelitis, right ankle and foot ([place]) 09/15/2020   Right flank pain 09/15/2020   Atherosclerosis of native arteries of the extremities with ulceration ([place]) 08/05/2020   Encounter for removal of sutures 07/27/2020   Neuropathic ulcer of foot, unspecified laterality, limited to breakdown of skin ([place]) 06/09/2020   Anemia 01/16/2020   Headache, unspecified 12/03/2019   Other disorders of phosphorus metabolism 10/25/2019   Unspecified protein-calorie malnutrition ([place]) 09/27/2019   FUO (fever of unknown origin) 09/17/2019   Cerebral thrombosis with cerebral infarction 08/02/2019   Cerebral embolism with cerebral infarction 08/02/2019   Acute respiratory failure due to COVID-19 [facility]) 07/30/2019   Acute respiratory failure with hypoxia ([place]) 07/30/2019   Allergy, unspecified, initial encounter 02/01/9484   Chronic systolic HF (heart failure) ([place]) 02/25/2019   ESRD (end stage renal disease) on dialysis ([place]) 02/25/2019   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 kidney disease, or end stage renal disease ([place]) 12/04/2018   Hypoglycemia, unspecified 08/17/2018   Generalized abdominal pain 07/16/2018   Hypercalcemia 06/28/2018   Arteriovenous fistula for hemodialysis in place, primary ([place]) 05/15/2018   Failed kidney transplant 05/15/2018   Anemia in chronic kidney disease 05/13/2018   Idiopathic gout, unspecified site 46/27/0350   Complication of vascular dialysis catheter 01/16/2018   Other specified coagulation defects ([place]) 01/16/2018   Pain,  unspecified  01/16/2018   Pruritus, unspecified 01/16/2018   Secondary hyperparathyroidism of renal origin ([place]) 01/16/2018   Shortness of breath 01/16/2018   Type 2 diabetes mellitus with diabetic peripheral angiopathy without gangrene ([place]) 01/16/2018   Abnormal gait 01/10/2018   Dependence on renal dialysis ([place]) 01/10/2018   Iron deficiency anemia, unspecified 12/21/2017   Acute blood loss anemia 12/14/2017   Hypomagnesemia 12/03/2017   Anticoagulated 12/02/2017   Stroke ([place]) 11/29/2017   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 11/28/2017   History of MI (myocardial infarction) 11/28/2017   Hypertension 11/28/2017   Chronic hepatitis C without hepatic coma ([place]) 09/25/2017   Diabetes mellitus type 2, uncomplicated ([place]) 22/77/3750   Myocardial infarction ([place]) 09/25/2017   Renal transplant recipient 09/25/2017   Transfusion history 09/25/2017   Vascular dementia without behavioral disturbance ([place]) 03/10/2017   History of coronary artery disease 09/12/2016   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 MI ([place]) 09/12/2016   Acute coronary thrombosis not resulting in myocardial infarction ([place]) 09/12/2016   Aftercare following organ transplant 09/11/2016   Mineral metabolism disorder 09/11/2016   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 MRN: [id number] Date of Birth: 01-17-1951

## 2021-03-12 ENCOUNTER — Emergency Department (HOSPITAL_COMMUNITY)
Admission: EM | Admit: 2021-03-12 | Discharge: 2021-03-12 | Disposition: A | Discharge status: Home / Self Care | Payer: Medicare Other | Attending: Emergency Medicine | Admitting: Emergency Medicine

## 2021-03-12 ENCOUNTER — Other Ambulatory Visit: Payer: Self-pay

## 2021-03-12 ENCOUNTER — Encounter (HOSPITAL_COMMUNITY): Payer: Self-pay

## 2021-03-12 DIAGNOSIS — N186 End stage renal disease: Secondary | ICD-10-CM | POA: Insufficient documentation

## 2021-03-12 DIAGNOSIS — I2510 Atherosclerotic heart disease of native coronary artery without angina pectoris: Secondary | ICD-10-CM | POA: Diagnosis not present

## 2021-03-12 DIAGNOSIS — T82590A Other mechanical complication of surgically created arteriovenous fistula, initial encounter: Secondary | ICD-10-CM | POA: Insufficient documentation

## 2021-03-12 DIAGNOSIS — I120 Hypertensive chronic kidney disease with stage 5 chronic kidney disease or end stage renal disease: Secondary | ICD-10-CM | POA: Insufficient documentation

## 2021-03-12 DIAGNOSIS — E1122 Type 2 diabetes mellitus with diabetic chronic kidney disease: Secondary | ICD-10-CM | POA: Diagnosis not present

## 2021-03-12 DIAGNOSIS — T829XXA Unspecified complication of cardiac and vascular prosthetic device, implant and graft, initial encounter: Secondary | ICD-10-CM

## 2021-03-12 DIAGNOSIS — Z87891 Personal history of nicotine dependence: Secondary | ICD-10-CM | POA: Diagnosis not present

## 2021-03-12 DIAGNOSIS — Z992 Dependence on renal dialysis: Secondary | ICD-10-CM | POA: Insufficient documentation

## 2021-03-12 DIAGNOSIS — Z940 Kidney transplant status: Secondary | ICD-10-CM | POA: Insufficient documentation

## 2021-03-12 DIAGNOSIS — Y712 Prosthetic and other implants, materials and accessory cardiovascular devices associated with adverse incidents: Secondary | ICD-10-CM | POA: Insufficient documentation

## 2021-03-12 DIAGNOSIS — Z7982 Long term (current) use of aspirin: Secondary | ICD-10-CM | POA: Diagnosis not present

## 2021-03-12 DIAGNOSIS — Z794 Long term (current) use of insulin: Secondary | ICD-10-CM | POA: Diagnosis not present

## 2021-03-12 LAB — CBC WITH DIFFERENTIAL/PLATELET
Abs Immature Granulocytes: 0.03 10*3/uL (ref 0.00–0.07)
Basophils Absolute: 0.1 10*3/uL (ref 0.0–0.1)
Basophils Relative: 1 %
Eosinophils Absolute: 0 10*3/uL (ref 0.0–0.5)
Eosinophils Relative: 1 %
HCT: 34.9 % — ABNORMAL LOW (ref 39.0–52.0)
Hemoglobin: 11 g/dL — ABNORMAL LOW (ref 13.0–17.0)
Immature Granulocytes: 1 %
Lymphocytes Relative: 32 %
Lymphs Abs: 1.8 10*3/uL (ref 0.7–4.0)
MCH: 31.3 pg (ref 26.0–34.0)
MCHC: 31.5 g/dL (ref 30.0–36.0)
MCV: 99.4 fL (ref 80.0–100.0)
Monocytes Absolute: 0.6 10*3/uL (ref 0.1–1.0)
Monocytes Relative: 11 %
Neutro Abs: 3.2 10*3/uL (ref 1.7–7.7)
Neutrophils Relative %: 54 %
Platelets: 88 10*3/uL — ABNORMAL LOW (ref 150–400)
RBC: 3.51 MIL/uL — ABNORMAL LOW (ref 4.22–5.81)
RDW: 19 % — ABNORMAL HIGH (ref 11.5–15.5)
WBC: 5.7 10*3/uL (ref 4.0–10.5)
nRBC: 0 % (ref 0.0–0.2)

## 2021-03-12 NOTE — Discharge Instructions (Addendum)
Please observe for any further bleeding Return if you have any ongoing problems

## 2021-03-12 NOTE — ED Triage Notes (Signed)
Pt BIB [facility] from dialysis center d/t his Right bleeding fistula that still has the "top needle in" upon arrival to ED. EMS reports dialysis center spent 1.5 hr attempting to stop his bleeding and once it stopped they realized he still has the top needle still in his arm.

## 2021-03-12 NOTE — ED Provider Notes (Signed)
[facility] EMERGENCY DEPARTMENT Provider Note   CSN: [id number] Arrival date & time: 03/12/21  1809     History Chief Complaint  Patient presents with   Bleeding Right Fistula    Jonathan [name]. is a 70 y.o. male.  HPI   Patient is a 70 year old male with past medical history of CAD, CHF, type 2 diabetes, ESRD (M, W, F) presenting from dialysis center due to right bleeding fistula.  EMS reports that dialysis center attempted to stop the bleeding, however, it took an hour and a half to stop the bleeding and was sent to the ED.  Patient still has a needle left in the dialysis site.  He has no active bleeding at this time.  Patient has had no change in sensation in his right arm.  Patient has normal movement in his right arm.  Patient denies any lightheadedness, dizziness, chest pain, shortness of breath, nausea, vomiting, diarrhea, abdominal pain, numbness or weakness. Past Medical History:  Diagnosis Date   CAD (coronary artery disease)    CHF (congestive heart failure) ([name])    Diabetes mellitus without complication ([place])    type 2   DM (diabetes mellitus) ([place])    ESRD (end stage renal disease) ([place])    FUO (fever of unknown origin) 09/17/2019   Hyperlipidemia    Hypertension    Osteomyelitis ([place])    Peripheral arterial disease ([name])    PVD (peripheral vascular disease) ([place])    Renal disorder 2015   right kidney transplant   Stroke [facility])    Vascular dementia [facility])     Patient Active Problem List   Diagnosis Date Noted   Osteomyelitis ([place]) 10/30/2020   Encounter for general adult medical examination without abnormal findings 09/15/2020   Hemiplegia of dominant side ([place]) 09/15/2020   History of cerebrovascular accident 09/15/2020   History of COVID-19 09/15/2020   Hypercoagulable state ([place]) 09/15/2020   Other chronic osteomyelitis, right ankle and foot ([place]) 09/15/2020   Right flank pain 09/15/2020   Atherosclerosis of native arteries of the  extremities with ulceration ([place]) 08/05/2020   Encounter for removal of sutures 07/27/2020   Neuropathic ulcer of foot, unspecified laterality, limited to breakdown of skin ([place]) 06/09/2020   Anemia 01/16/2020   Headache, unspecified 12/03/2019   Other disorders of phosphorus metabolism 10/25/2019   Unspecified protein-calorie malnutrition ([place]) 09/27/2019   FUO (fever of unknown origin) 09/17/2019   Cerebral thrombosis with cerebral infarction 08/02/2019   Cerebral embolism with cerebral infarction 08/02/2019   Acute respiratory failure due to COVID-19 [facility]) 07/30/2019   Acute respiratory failure with hypoxia ([place]) 07/30/2019   Allergy, unspecified, initial encounter [id number]   Chronic systolic HF (heart failure) ([place]) 02/25/2019   ESRD (end stage renal disease) on dialysis ([place]) 02/25/2019   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 kidney disease, or end stage renal disease ([place]) 12/04/2018   Hypoglycemia, unspecified 08/17/2018   Generalized abdominal pain 07/16/2018   Hypercalcemia 06/28/2018   Arteriovenous fistula for hemodialysis in place, primary ([place]) 05/15/2018   Failed kidney transplant 05/15/2018   Anemia in chronic kidney disease 05/13/2018   Idiopathic gout, unspecified site 0000000   Complication of vascular dialysis catheter 01/16/2018   Other specified coagulation defects ([place]) 01/16/2018   Pain, unspecified 01/16/2018   Pruritus, unspecified 01/16/2018   Secondary hyperparathyroidism of renal origin ([place]) 01/16/2018   Shortness of breath 01/16/2018   Type 2 diabetes mellitus with diabetic peripheral  angiopathy without gangrene ([place]) 01/16/2018   Abnormal gait 01/10/2018   Dependence on renal dialysis ([place]) 01/10/2018   Iron deficiency anemia, unspecified 12/21/2017   Acute blood loss anemia 12/14/2017   Hypomagnesemia 12/03/2017   Anticoagulated 12/02/2017   Stroke ([place]) 11/29/2017   Acute antibody mediated  rejection of transplanted kidney 11/28/2017   History of MI (myocardial infarction) 11/28/2017   Hypertension 11/28/2017   Chronic hepatitis C without hepatic coma ([place]) 09/25/2017   Diabetes mellitus type 2, uncomplicated ([place]) [id number]   Myocardial infarction ([place]) 09/25/2017   Renal transplant recipient 09/25/2017   Transfusion history 09/25/2017   Vascular dementia without behavioral disturbance ([place]) 03/10/2017   History of coronary artery disease 09/12/2016   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 MI ([place]) 09/12/2016   Acute coronary thrombosis not resulting in myocardial infarction ([place]) 09/12/2016   Aftercare following organ transplant 09/11/2016   Mineral metabolism disorder 09/11/2016   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   Hyperlipidemia 09/01/2016   Long term (current) use of anticoagulants 09/01/2016   BK viremia 06/16/2015   At risk for infection transmitted from donor 06/16/2014   Ureteral stent displacement ([place]) 06/10/2014   Prophylactic antibiotic 06/09/2014   Immunosuppression ([place]) 06/06/2014    Past Surgical History:  Procedure Laterality Date   ABDOMINAL AORTOGRAM N/A 08/05/2020   Procedure: ABDOMINAL AORTOGRAM;  Surgeon: [name], MD;  Location: [place] CV [facility];  Service: Cardiovascular;  Laterality: N/A;   ABDOMINAL AORTOGRAM W/LOWER EXTREMITY N/A 09/09/2020   Procedure: ABDOMINAL AORTOGRAM W/LOWER EXTREMITY;  Surgeon: [name], MD;  Location: [place] [facility];  Service: Cardiovascular;  Laterality: N/A;   AMPUTATION Right 08/06/2020   Procedure: Right Fifth Toe Ray Amputation;  Surgeon: [name], MD;  Location: [place];  Service: Vascular;  Laterality: Right;   AMPUTATION Right 11/02/2020   Procedure: RIGHT AMPUTATION BELOW KNEE;  Surgeon: [name], MD;  Location: [place];  Service: Vascular;  Laterality: Right;   BACK SURGERY     Has had 2 back surgeries   BUBBLE STUDY  09/18/2019   Procedure: BUBBLE STUDY;   Surgeon: Buford Dresser, MD;  Location: [place];  Service: Cardiovascular;;   Depression     ENDARTERECTOMY FEMORAL Right 08/06/2020   Procedure: Right Ilio- Femoral Artery Endarterectomy;  Surgeon: [name], MD;  Location: [facility] OR;  Service: Vascular;  Laterality: Right;   HAND SURGERY Right    HAND SURGERY     IR REMOVAL TUN CV CATH W/O [facility]  09/03/2019   KIDNEY TRANSPLANT     November 2015   LEG AMPUTATION BELOW KNEE Right    LOWER EXTREMITY ANGIOGRAPHY Bilateral 08/05/2020   Procedure: LOWER EXTREMITY ANGIOGRAPHY;  Surgeon: [name], MD;  Location: [place] [facility];  Service: Cardiovascular;  Laterality: Bilateral;   Memory loss     NEPHRECTOMY TRANSPLANTED ORGAN     PATCH ANGIOPLASTY Right 08/06/2020   Procedure: PATCH ANGIOPLASTY;  Surgeon: [name], MD;  Location: [facility] OR;  Service: Vascular;  Laterality: Right;   PERIPHERAL VASCULAR BALLOON ANGIOPLASTY Right 09/09/2020   Procedure: PERIPHERAL VASCULAR BALLOON ANGIOPLASTY;  Surgeon: [name], MD;  Location: [place] [facility] LAB;  Service: Cardiovascular;  Laterality: Right;  popliteal   PERIPHERAL VASCULAR INTERVENTION Right 08/05/2020   Procedure: PERIPHERAL VASCULAR INTERVENTION;  Surgeon: [name], MD;  Location: [place] [facility];  Service: Cardiovascular;  Laterality: Right;  SFA   PERIPHERAL VASCULAR INTERVENTION Right 09/09/2020   Procedure: PERIPHERAL  VASCULAR INTERVENTION;  Surgeon: [name], MD;  Location: [place] [facility];  Service: Cardiovascular;  Laterality: Right;  external iliac   TEE WITHOUT CARDIOVERSION N/A 09/18/2019   Procedure: TRANSESOPHAGEAL ECHOCARDIOGRAM (TEE);  Surgeon: Buford Dresser, MD;  Location[facility]COPY;  Service: Cardiovascular;  Laterality: N/A;   WOUND DEBRIDEMENT Right 10/08/2020   Procedure: DEBRIDEMENT WOUND RIGHT FOOT;  Surgeon: [name], MD;  Location: [facility] OR;  Service: Vascular;  Laterality: Right;       Family  History  Problem Relation Age of Onset   Diabetes Mellitus II Mother    Cancer Mother        STOMACH   Diabetes Mellitus II Brother    Other Father        UNKOWN   Healthy Daughter    Diabetes Son     Social History   Tobacco Use   Smoking status: Former   Smokeless tobacco: Never  Scientific laboratory technician Use: Never used  Substance Use Topics   Alcohol use: Never   Drug use: Never    Home Medications Prior to Admission medications   Medication Sig Start Date End Date Taking? Authorizing Provider  allopurinol (ZYLOPRIM) 100 MG tablet Take 100 mg by mouth at bedtime. 06/09/14   [provider]  apixaban (ELIQUIS) 5 MG TABS tablet Take 5 mg by mouth 2 (two) times daily.    [provider]  aspirin 81 MG chewable tablet Chew 81 mg by mouth daily.  06/09/14   [provider]  atorvastatin (LIPITOR) 40 MG tablet Take 40 mg by mouth daily.    [provider]  b complex-vitamin c-folic acid (NEPHRO-VITE) 0.8 MG TABS tablet Take 1 tablet by mouth daily. 05/08/20   [provider]  calcitRIOL (ROCALTROL) 0.5 MCG capsule Take 2 capsules (1 mcg total) by mouth every Monday, Wednesday, and Friday. 11/09/20   Florencia Reasons, MD  clopidogrel (PLAVIX) 75 MG tablet TAKE 1 TABLET (75 MG TOTAL) BY MOUTH DAILY WITH BREAKFAST. 08/10/20 08/10/21  Dagoberto Ligas, PA-C  feeding supplement (BOOST HIGH PROTEIN) LIQD Take 1 Container by mouth daily as needed (nutrition).    [provider]  insulin aspart (NOVOLOG) 100 UNIT/ML injection Insulin sliding scale: Blood sugar  120-150   3units                       151-200   4units                       201-250   7units                       251- 300  11units                       301-350   15uints                       351-400   20units                       >400         call MD immediately 11/06/20   Florencia Reasons, MD  Insulin Pen Needle 32G X 4 MM MISC 1 each by Other route as needed (insulin).  09/10/16   [provider]  levalbuterol Penne Lash HFA) 45 MCG/ACT inhaler Inhale 2 puffs into the lungs  every 8 (eight) hours as needed for wheezing. 09/21/19   Raiford Noble Latif, DO  lidocaine-prilocaine (EMLA) cream Apply 1 application topically 3 (three) times a week. 30 minutes prior to Dialysis - MWF 05/08/18   [provider]  oxyCODONE-acetaminophen (PERCOCET/ROXICET) 5-325 MG tablet Take 1 tablet by mouth every 6 (six) hours as needed for moderate pain. 11/06/20   Florencia Reasons, MD  predniSONE (DELTASONE) 5 MG tablet Take 5 mg by mouth daily with breakfast. 03/02/20   [provider]  senna-docusate (SENOKOT-S) 8.6-50 MG tablet Take 1 tablet by mouth 2 (two) times daily. Hold if diarrhea 11/06/20   Florencia Reasons, MD  sevelamer carbonate (RENVELA) 800 MG tablet Take 800 mg by mouth 3 (three) times daily with meals. 03/31/20   [provider]  warfarin (COUMADIN) 6 MG tablet Take 6-9 mg by mouth See admin instructions. Take 9 mg on Tues, Thurs, and Sun. Take 6 mg on Sun, Mon, Wed, Fri, and Sat    [provider]    Allergies    Patient has no known allergies.  Review of Systems   Review of Systems  Constitutional:  Negative for chills, diaphoresis, fatigue and fever.  HENT:  Negative for congestion, dental problem, ear pain, facial swelling, hearing loss, nosebleeds, postnasal drip, rhinorrhea, sore throat and trouble swallowing.   Eyes:  Negative for photophobia, pain and visual disturbance.  Respiratory:  Negative for apnea, cough, choking, chest tightness, shortness of breath, wheezing and stridor.   Cardiovascular:  Negative for chest pain, palpitations and leg swelling.  Gastrointestinal:  Negative for abdominal distention, abdominal pain, constipation, diarrhea, nausea and vomiting.  Endocrine: Negative for polydipsia and polyuria.  Genitourinary:  Negative for difficulty urinating, dysuria, flank pain, frequency, hematuria and urgency.  Musculoskeletal:  Negative for gait  problem, myalgias, neck pain and neck stiffness.  Skin:  Negative for rash and wound.  Allergic/Immunologic: Negative for environmental allergies and food allergies.  Neurological:  Negative for dizziness, tremors, seizures, syncope, facial asymmetry, speech difficulty, light-headedness, numbness and headaches.  Psychiatric/Behavioral:  Negative for behavioral problems and confusion.   All other systems reviewed and are negative.  Physical Exam Updated Vital Signs BP 136/65   Pulse 82   Resp 18   SpO2 100%   Physical Exam Vitals and nursing note reviewed.  Constitutional:      General: He is not in acute distress.    Appearance: Normal appearance. He is normal weight.  HENT:     Head: Normocephalic and atraumatic.     Right Ear: External ear normal.     Left Ear: External ear normal.     Nose: Nose normal. No congestion.     Mouth/Throat:     Mouth: Mucous membranes are moist.     Pharynx: Oropharynx is clear. No oropharyngeal exudate or posterior oropharyngeal erythema.  Eyes:     General: No visual field deficit.    Extraocular Movements: Extraocular movements intact.     Conjunctiva/sclera: Conjunctivae normal.     Pupils: Pupils are equal, round, and reactive to light.  Cardiovascular:     Rate and Rhythm: Normal rate and regular rhythm.     Pulses: Normal pulses.     Heart sounds: Normal heart sounds. No murmur heard.   No friction rub. No gallop.  Pulmonary:     Effort: Pulmonary effort is normal. No respiratory distress.     Breath sounds: Normal breath sounds. No stridor. No wheezing, rhonchi or rales.  Chest:  Chest wall: No tenderness.  Abdominal:     General: Abdomen is flat. Bowel sounds are normal. There is no distension.     Palpations: Abdomen is soft.     Tenderness: There is no abdominal tenderness. There is no right CVA tenderness, left CVA tenderness, guarding or rebound.  Musculoskeletal:        General: No swelling or tenderness. Normal range of  motion.     Cervical back: Normal range of motion and neck supple. No rigidity, tenderness or bony tenderness.     Thoracic back: Normal. No tenderness or bony tenderness.     Lumbar back: Normal. No tenderness or bony tenderness.     Right lower leg: No edema.     Left lower leg: No edema.     Comments: RUE dialysis in place with no signs of active bleeding     Right Lower Extremity: Right leg is amputated below knee.  Skin:    General: Skin is warm and dry.  Neurological:     General: No focal deficit present.     Mental Status: He is alert and oriented to person, place, and time. Mental status is at baseline.     Cranial Nerves: Cranial nerves are intact. No cranial nerve deficit, dysarthria or facial asymmetry.     Sensory: Sensation is intact. No sensory deficit.     Motor: Motor function is intact. No weakness.     Coordination: Coordination is intact. Finger-Nose-Finger Test normal.     Gait: Gait is intact. Gait normal.  Psychiatric:        Mood and Affect: Mood normal.        Behavior: Behavior normal.        Thought Content: Thought content normal.        Judgment: Judgment normal.    ED Results / Procedures / Treatments   Labs (all labs ordered are listed, but only abnormal results are displayed) Labs Reviewed  CBC WITH DIFFERENTIAL/PLATELET - Abnormal; Notable for the following components:      Result Value   RBC 3.51 (*)    Hemoglobin 11.0 (*)    HCT 34.9 (*)    RDW 19.0 (*)    Platelets 88 (*)    All other components within normal limits    EKG None  Radiology No results found.  Procedures Procedures   Medications Ordered in ED Medications - No data to display  ED Course  I have reviewed the triage vital signs and the nursing notes.  Pertinent labs & imaging results that were available during my care of the patient were reviewed by me and considered in my medical decision making (see chart for details).    MDM Rules/Calculators/A&P                          Jonathan BARBOT Sr. is a 70 y.o. male with past medical history of CAD, CHF, type 2 diabetes, ESRD (M, W, F) presenting from dialysis center due to right bleeding fistula. Patient is hemodynamically stable and in no acute distress. Our IV team removed the needle that was left in place during dialysis. Right upper extremity fistula hemostatic and in place. His hemoglobin is stable prior to previous CBC. He is neurovascularly intact in his right arm. Patient will follow up with PCP and continue with dialysis treatments.   Patient states compliance and understanding of the plan. I explained labs and imaging to the patient. No further  questions at this time from the patient.  The patient is safe and stable for discharge at this time with return precautions provided and a plan for follow-up care in place as needed.  The plan for this patient was discussed with Dr. Jeanell Sparrow, who voiced agreement and who oversaw evaluation and treatment of this patient.   Final Clinical Impression(s) / ED Diagnoses Final diagnoses:  Complication of AV dialysis fistula, initial encounter    Rx / DC Orders ED Discharge Orders     None        Doretha Sou, MD 03/12/21 [id number]    Pattricia Boss, MD 03/16/21 [phone]

## 2021-03-12 NOTE — ED Notes (Signed)
Bleeding from fistula continues to subside. Clean and dry dressing noted.

## 2021-03-23 ENCOUNTER — Encounter: Payer: Medicare Other | Admitting: Physical Therapy

## 2021-03-25 ENCOUNTER — Other Ambulatory Visit: Payer: Self-pay

## 2021-03-25 ENCOUNTER — Encounter: Payer: Self-pay | Admitting: Physical Therapy

## 2021-03-25 ENCOUNTER — Ambulatory Visit (INDEPENDENT_AMBULATORY_CARE_PROVIDER_SITE_OTHER): Payer: Medicare Other | Admitting: Physical Therapy

## 2021-03-25 DIAGNOSIS — R293 Abnormal posture: Secondary | ICD-10-CM

## 2021-03-25 DIAGNOSIS — M6281 Muscle weakness (generalized): Secondary | ICD-10-CM | POA: Diagnosis not present

## 2021-03-25 DIAGNOSIS — R2689 Other abnormalities of gait and mobility: Secondary | ICD-10-CM | POA: Diagnosis not present

## 2021-03-25 DIAGNOSIS — R2681 Unsteadiness on feet: Secondary | ICD-10-CM | POA: Diagnosis not present

## 2021-03-25 NOTE — Patient Instructions (Signed)
Wear prosthesis for 2 hours twice on days that you do not go to dialysis and once for 2 hours after dialysis.    Do each exercise 1-2  times per day Do each exercise 5-10 repetitions Hold each exercise for 2 seconds to feel your location  AT [place].  Try to find this position when standing still for activities.   USE TAPE ON FLOOR TO MARK THE MIDLINE POSITION which is even with middle of sink.  You also should try to feel with your limb pressure in socket.  You are trying to feel with limb what you used to feel with the bottom of your foot.  Side to Side Shift: Moving your hips only (not shoulders): move weight onto your left leg, HOLD/FEEL pressure in socket.  Move back to equal weight on each leg, HOLD/FEEL pressure in socket. Move weight onto your right leg, HOLD/FEEL pressure in socket. Move back to equal weight on each leg, HOLD/FEEL pressure in socket. Repeat.  Start with both hands on sink, progress to hand on prosthetic side only, then no hands.  Front to Back Shift: Moving your hips only (not shoulders): move your weight forward onto your toes, HOLD/FEEL pressure in socket. Move your weight back to equal Flat Foot on both legs, HOLD/FEEL  pressure in socket. Move your weight back onto your heels, HOLD/FEEL  pressure in socket. Move your weight back to equal on both legs, HOLD/FEEL  pressure in socket. Repeat.  Start with both hands on sink, progress to hand on prosthetic side only, then no hands.  Moving Cones / Cups: With equal weight on each leg: Hold on with one hand the first time, then progress to no hand supports. Move cups from one side of sink to the other. Place cups ~2" out of your reach, progress to 10" beyond reach.  Place one hand in middle of sink and reach with other hand. Do both arms.  Then hover one hand and move cups with other hand.  Overhead/Upward Reaching: alternated reaching up to top cabinets or  ceiling if no cabinets present. Keep equal weight on each leg. Start with one hand support on counter while other hand reaches and progress to no hand support with reaching.  ace one hand in middle of sink and reach with other hand. Do both arms.  Then hover one hand and move cups with other hand.  5.   Looking Over Shoulders: With equal weight on each leg: alternate turning to look over your shoulders with one hand support on counter as needed.  Start with head motions only to look in front of shoulder, then even with shoulder and progress to looking behind you. To look to side, move head /eyes, then shoulder on side looking pulls back, shift more weight to side looking and pull hip back. Place one hand in middle of sink and let go with other hand so your shoulder can pull back. Switch hands to look other way.   Then hover one hand and look over shoulder. If looking right, use left hand at sink. If looking left, use right hand at sink. 6.  Stepping with leg that is not amputated:  Move items under cabinet out of your way. Shift your hips/pelvis so weight on prosthesis. Tighten muscles in hip on prosthetic side.  SLOWLY step other leg so front of foot is in cabinet. Then step back to floor.

## 2021-03-25 NOTE — Therapy (Signed)
[facility] [address] [place], [place], [address] Phone: [phone]   Fax:  [phone]  Physical Therapy Treatment  Patient Details  Name: Jonathan MORINO Sr. MRN: [id number] Date of Birth: 11-27-1950 Referring Provider (PT): Jamelle Haring, MD   Encounter Date: 03/25/2021   PT End of Session - 03/25/21 1141     Visit Number 2    Number of Visits 25    Date for PT Re-Evaluation 06/10/21    Authorization Type BCBS Medicare    Authorization Time Period OOP & Ded met, $0 co-pay    Progress Note Due on Visit 10    PT Start Time 1141    PT Stop Time 1222    PT Time Calculation (min) 41 min    Equipment Utilized During Treatment Gait belt    Activity Tolerance Patient tolerated treatment well;Other (comment)   limited by cognitive deficits / memory   Behavior During Therapy WFL for tasks assessed/performed             Past Medical History:  Diagnosis Date   CAD (coronary artery disease)    CHF (congestive heart failure) ([name])    Diabetes mellitus without complication ([place])    type 2   DM (diabetes mellitus) ([name])    ESRD (end stage renal disease) ([name])    FUO (fever of unknown origin) 09/17/2019   Hyperlipidemia    Hypertension    Osteomyelitis ([place])    Peripheral arterial disease ([name])    PVD (peripheral vascular disease) ([place])    Renal disorder 2015   right kidney transplant   Stroke [facility])    Vascular dementia [facility])     Past Surgical History:  Procedure Laterality Date   ABDOMINAL AORTOGRAM N/A 08/05/2020   Procedure: ABDOMINAL AORTOGRAM;  Surgeon: [name], MD;  Location: [place] [facility];  Service: Cardiovascular;  Laterality: N/A;   ABDOMINAL AORTOGRAM W/LOWER EXTREMITY N/A 09/09/2020   Procedure: ABDOMINAL AORTOGRAM W/LOWER EXTREMITY;  Surgeon: [name], MD;  Location: [place] [facility];  Service: Cardiovascular;  Laterality: N/A;   AMPUTATION Right 08/06/2020   Procedure: Right Fifth Toe Ray  Amputation;  Surgeon: [name], MD;  Location: [place];  Service: Vascular;  Laterality: Right;   AMPUTATION Right 11/02/2020   Procedure: RIGHT AMPUTATION BELOW KNEE;  Surgeon: [name], MD;  Location: [place];  Service: Vascular;  Laterality: Right;   BACK SURGERY     Has had 2 back surgeries   BUBBLE STUDY  09/18/2019   Procedure: BUBBLE STUDY;  Surgeon: Buford Dresser, MD;  Location: [place];  Service: Cardiovascular;;   Depression     ENDARTERECTOMY FEMORAL Right 08/06/2020   Procedure: Right Ilio- Femoral Artery Endarterectomy;  Surgeon: [name], MD;  Location: [facility] OR;  Service: Vascular;  Laterality: Right;   HAND SURGERY Right    HAND SURGERY     IR REMOVAL TUN CV CATH W/O [facility]  09/03/2019   KIDNEY TRANSPLANT     November 2015   LEG AMPUTATION BELOW KNEE Right    LOWER EXTREMITY ANGIOGRAPHY Bilateral 08/05/2020   Procedure: LOWER EXTREMITY ANGIOGRAPHY;  Surgeon: [name], MD;  Location: [place] [facility];  Service: Cardiovascular;  Laterality: Bilateral;   Memory loss     NEPHRECTOMY TRANSPLANTED ORGAN     PATCH ANGIOPLASTY Right 08/06/2020   Procedure: PATCH ANGIOPLASTY;  Surgeon: [name], MD;  Location: [place];  Service: Vascular;  Laterality: Right;   PERIPHERAL  VASCULAR BALLOON ANGIOPLASTY Right 09/09/2020   Procedure: PERIPHERAL VASCULAR BALLOON ANGIOPLASTY;  Surgeon: [name], MD;  Location: [place] [facility];  Service: Cardiovascular;  Laterality: Right;  popliteal   PERIPHERAL VASCULAR INTERVENTION Right 08/05/2020   Procedure: PERIPHERAL VASCULAR INTERVENTION;  Surgeon: [name], MD;  Location: [place] [facility];  Service: Cardiovascular;  Laterality: Right;  SFA   PERIPHERAL VASCULAR INTERVENTION Right 09/09/2020   Procedure: PERIPHERAL VASCULAR INTERVENTION;  Surgeon: [name], MD;  Location: [place] [facility];  Service: Cardiovascular;  Laterality: Right;  external iliac   TEE WITHOUT  CARDIOVERSION N/A 09/18/2019   Procedure: TRANSESOPHAGEAL ECHOCARDIOGRAM (TEE);  Surgeon: Buford Dresser, MD;  Location: [facility] ENDOSCOPY;  Service: Cardiovascular;  Laterality: N/A;   WOUND DEBRIDEMENT Right 10/08/2020   Procedure: DEBRIDEMENT WOUND RIGHT FOOT;  Surgeon: [name], MD;  Location: [place];  Service: Vascular;  Laterality: Right;    There were no vitals filed for this visit.   Subjective Assessment - 03/25/21 1141     Subjective He went to ED because dialysis fistula would not stop bleeding. He has not worn prosthesis since PT evaluation.    Patient is accompained by: Family member   wife   Pertinent History rt TTA, DM2, HTN, PVD, ESRD w/dialysis, hx right kidney transplant Nov 2015, Stroke, back sg X 2, memory issues    Limitations Lifting;Walking;House hold activities    Patient Stated Goals use prosthesis to get around house & community, be able to get in & out of high truck. be able to go to bathroom without assistance.    Currently in Pain? No/denies                               [facility] Adult PT Treatment/Exercise - 03/25/21 1145       Transfers   Transfers Sit to Stand;Stand to [employer];Lateral/Scoot Transfers    Sit to Stand 4: Min assist;With upper extremity assist;With armrests;From chair/3-in-1;Other (comment);3: Mod assist   to sink & to RW   Sit to Stand Details Verbal cues for technique;Tactile cues for weight shifting    Stand to Sit 4: Min guard;With upper extremity assist;With armrests;To chair/3-in-1;Uncontrolled descent;Other (comment)   from sink & from RW   Stand to Sit Details (indicate cue type and reason) Tactile cues for weight shifting;Verbal cues for technique    Stand Pivot Transfers 3: Mod assist   turning 180* to position to sit with RW & TTA prosthesis   Stand Pivot Transfer Details (indicate cue type and reason) manual, tactile & verbal cues on technique & weight shift.    Lateral/Scoot Transfers  4: Min guard   w/c <> Nustep     Ambulation/Gait   Ambulation/Gait Yes    Ambulation/Gait Assistance 3: Mod assist    Ambulation Distance (Feet) 30 Feet   30' X 2   Assistive device Rolling walker;Prosthesis    Ambulation Surface Level;Indoor      Neuro Re-ed    Neuro Re-ed Details  PT initiated HEP (see pt instructions) to work on balance & proprioception. PT manual cues to weight shift onto prosthesis.   However wife did not stay for PT session & with patient's cognitive deficits carryover will probably be poor.      Exercises   Exercises Knee/Hip      Knee/Hip Exercises: Aerobic   Nustep Seat 11 level 5 with BLEs & BUEs for 10  minutes.      Prosthetics   Prosthetic Care Comments  PT manual, tactile & verbal cues on donning prosthesis. PT recommended wear 2 hrs 2x on non-dialysis days & 1x after dialysis    Current prosthetic wear tolerance (days/week)  0 except in PT    Current prosthetic wear tolerance (#hours/day)  0 except in PT    Current prosthetic weight-bearing tolerance (hours/day)  Patient tolerated standing for 5 min with partial weight on prosthesis with no c/o pain or discomfort.    Residual limb condition  no open areas, normal color & temperature, cylinderical shape.    Education Provided Proper Donning;Proper wear schedule/adjustment;Other (comment)   see prosthetic care comments   Person(s) Educated Patient    Education Method Explanation;Demonstration;Tactile cues;Verbal cues;Handout    Education Method Tactile cues required;Verbal cues required;Needs further instruction    Donning Prosthesis Moderate assist                    PT Education - 03/25/21 1208     Education Details HEP at sink - see pt instructions    Person(s) Educated Patient    Methods Explanation;Demonstration;Tactile cues;Verbal cues;Handout;Other (comment)   manual cues   Comprehension Need further instruction;Verbal cues required;Tactile cues required              PT Short  Term Goals - 03/11/21 1412       PT SHORT TERM GOAL #1   Title Wife donnes prosthesis correctly & verbalizes proper cleaning.    Time 4    Period Weeks    Status New    Target Date 04/15/21      PT SHORT TERM GOAL #2   Title Patient tolerates prosthesis >8 hrs total on nondialysis days & >4 hours if out of bed on dialysis days without skin issues or limb pain    Time 4    Period Weeks    Status New    Target Date 04/15/21      PT SHORT TERM GOAL #3   Title Patient able to sit to & from stand w/c or chair with armrests to RW with supervision.    Time 4    Period Weeks    Status New    Target Date 04/15/21      PT SHORT TERM GOAL #4   Title Patient ambulates 100' with RW & prosthesis with minA.    Time 4    Period Weeks    Status New    Target Date 04/15/21      PT SHORT TERM GOAL #5   Title Patient able to negotiate curbs with RW with modA.    Time 4    Period Weeks    Status New    Target Date 04/15/21               PT Long Term Goals - 03/11/21 1408       PT LONG TERM GOAL #1   Title Patient's wife demonstrates & verbalized understanding of prosthetic care to enable safe utilization of prosthesis.    Time 12    Period Weeks    Status New    Target Date 06/10/21      PT LONG TERM GOAL #2   Title Patient tolerates prosthesis wear >90% of awake hours without skin or limb pain issues.    Time 12    Period Weeks    Status New    Target Date 06/10/21  PT LONG TERM GOAL #3   Title Patient able to perform standing ADLs like managing pants to toilet with RW & prosthesis with supervision.    Time 12    Period Weeks    Status New    Target Date 06/10/21      PT LONG TERM GOAL #4   Title Patient ambulates 200' with RW or rollator walker & prosthesis with wife's supervision.    Time 12    Period Weeks    Status New    Target Date 06/10/21      PT LONG TERM GOAL #5   Title Patient negotiates ramps & curbs with RW or rollator walker & prosthesis with  minA from wife.    Time 12    Period Weeks    Status New    Target Date 06/10/21                   Plan - 03/25/21 1141     Clinical Impression Statement PT instructed patient in HEP to work on standing balance & proprioception.  Patient's wife did not come into PT clinic so probably limited carryover for home in spite of handout.  PT did see wife in parking lot after PT and reinforced that she will need to attend sessions due to his memory deficits will limit carryover to home.    Personal Factors and Comorbidities Comorbidity 3+;Fitness;Time since onset of injury/illness/exacerbation    Comorbidities rt TTA, DM2, HTN, PVD, ESRD w/dialysis, hx right kidney transplant Nov 2015, Stroke, back sg X 2, memory loss    Examination-Activity Limitations Stand;Transfers;Toileting;Stairs;Locomotion Level    Examination-Participation Restrictions Community Activity;Other   household mobility & ADLs   Stability/Clinical Decision Making Evolving/Moderate complexity    Rehab Potential Good    PT Frequency 2x / week    PT Duration 12 weeks    PT Treatment/Interventions ADLs/Self Care Home Management;DME Instruction;Gait training;Stair training;Functional mobility training;Therapeutic activities;Therapeutic exercise;Balance training;Neuromuscular re-education;Patient/family education;Prosthetic Training;Vestibular    PT Next Visit Plan if wife comes to PT session review HEP,  work on prosthetic gait with RW & standing balance activities, Nustep    Consulted and Agree with Plan of Care Patient    Family Member Consulted wife, Arn Mcomber             Patient will benefit from skilled therapeutic intervention in order to improve the following deficits and impairments:  Abnormal gait, Decreased activity tolerance, Decreased balance, Decreased endurance, Decreased knowledge of use of DME, Decreased mobility, Decreased strength, Increased edema, Impaired flexibility, Postural dysfunction,  Prosthetic Dependency  Visit Diagnosis: Other abnormalities of gait and mobility  Unsteadiness on feet  Muscle weakness (generalized)  Abnormal posture     Problem List Patient Active Problem List   Diagnosis Date Noted   Osteomyelitis ([place]) 10/30/2020   Encounter for general adult medical examination without abnormal findings 09/15/2020   Hemiplegia of dominant side ([place]) 09/15/2020   History of cerebrovascular accident 09/15/2020   History of COVID-19 09/15/2020   Hypercoagulable state ([place]) 09/15/2020   Other chronic osteomyelitis, right ankle and foot ([place]) 09/15/2020   Right flank pain 09/15/2020   Atherosclerosis of native arteries of the extremities with ulceration ([place]) 08/05/2020   Encounter for removal of sutures 07/27/2020   Neuropathic ulcer of foot, unspecified laterality, limited to breakdown of skin ([place]) 06/09/2020   Anemia 01/16/2020   Headache, unspecified 12/03/2019   Other disorders of phosphorus metabolism 10/25/2019   Unspecified protein-calorie malnutrition ([place]) 09/27/2019  FUO (fever of unknown origin) 09/17/2019   Cerebral thrombosis with cerebral infarction 08/02/2019   Cerebral embolism with cerebral infarction 08/02/2019   Acute respiratory failure due to COVID-19 [facility]) 07/30/2019   Acute respiratory failure with hypoxia ([place]) 07/30/2019   Allergy, unspecified, initial encounter 57/08/7791   Chronic systolic HF (heart failure) ([place]) 02/25/2019   ESRD (end stage renal disease) on dialysis ([place]) 02/25/2019   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 kidney disease, or end stage renal disease ([place]) 12/04/2018   Hypoglycemia, unspecified 08/17/2018   Generalized abdominal pain 07/16/2018   Hypercalcemia 06/28/2018   Arteriovenous fistula for hemodialysis in place, primary ([place]) 05/15/2018   Failed kidney transplant 05/15/2018   Anemia in chronic kidney disease 05/13/2018   Idiopathic gout,  unspecified site 90/30/0923   Complication of vascular dialysis catheter 01/16/2018   Other specified coagulation defects ([place]) 01/16/2018   Pain, unspecified 01/16/2018   Pruritus, unspecified 01/16/2018   Secondary hyperparathyroidism of renal origin ([place]) 01/16/2018   Shortness of breath 01/16/2018   Type 2 diabetes mellitus with diabetic peripheral angiopathy without gangrene ([place]) 01/16/2018   Abnormal gait 01/10/2018   Dependence on renal dialysis ([place]) 01/10/2018   Iron deficiency anemia, unspecified 12/21/2017   Acute blood loss anemia 12/14/2017   Hypomagnesemia 12/03/2017   Anticoagulated 12/02/2017   Stroke ([place]) 11/29/2017   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 11/28/2017   History of MI (myocardial infarction) 11/28/2017   Hypertension 11/28/2017   Chronic hepatitis C without hepatic coma ([place]) 09/25/2017   Diabetes mellitus type 2, uncomplicated ([name]) 30/02/6225   Myocardial infarction ([place]) 09/25/2017   Renal transplant recipient 09/25/2017   Transfusion history 09/25/2017   Vascular dementia without behavioral disturbance ([place]) 03/10/2017   History of coronary artery disease 09/12/2016   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 MI ([place]) 09/12/2016   Acute coronary thrombosis not resulting in myocardial infarction ([place]) 09/12/2016   Aftercare following organ transplant 09/11/2016   Mineral metabolism disorder 09/11/2016   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 MRN: [id number] Date of Birth: 1950/12/04

## 2021-03-30 ENCOUNTER — Encounter: Payer: Medicare Other | Admitting: Physical Therapy

## 2021-03-30 ENCOUNTER — Encounter: Payer: Self-pay | Admitting: Physical Therapy

## 2021-03-30 ENCOUNTER — Ambulatory Visit (INDEPENDENT_AMBULATORY_CARE_PROVIDER_SITE_OTHER): Payer: Medicare Other | Admitting: Physical Therapy

## 2021-03-30 ENCOUNTER — Other Ambulatory Visit: Payer: Self-pay

## 2021-03-30 DIAGNOSIS — M6281 Muscle weakness (generalized): Secondary | ICD-10-CM

## 2021-03-30 DIAGNOSIS — R2689 Other abnormalities of gait and mobility: Secondary | ICD-10-CM | POA: Diagnosis not present

## 2021-03-30 DIAGNOSIS — R293 Abnormal posture: Secondary | ICD-10-CM

## 2021-03-30 DIAGNOSIS — R2681 Unsteadiness on feet: Secondary | ICD-10-CM | POA: Diagnosis not present

## 2021-03-30 NOTE — Therapy (Signed)
[facility] Physical Therapy [address] [place], [place], [address] Phone: [phone]   Fax:  [phone]  Physical Therapy Treatment  Patient Details  Name: Jonathan HUTTNER Sr. MRN: [id number] Date of Birth: 10/24/1950 Referring Provider (PT): Jamelle Haring, MD   Encounter Date: 03/30/2021   PT End of Session - 03/30/21 1801     Visit Number 3    Number of Visits 25    Date for PT Re-Evaluation 06/10/21    Authorization Type BCBS Medicare    Authorization Time Period OOP & Ded met, $0 co-pay    Progress Note Due on Visit 10    PT Start Time 1326    PT Stop Time 1358    PT Time Calculation (min) 32 min    Equipment Utilized During Treatment Gait belt    Activity Tolerance Patient tolerated treatment well;Other (comment)   limited by cognitive deficits / memory   Behavior During Therapy WFL for tasks assessed/performed             Past Medical History:  Diagnosis Date   CAD (coronary artery disease)    CHF (congestive heart failure) ([name])    Diabetes mellitus without complication ([place])    type 2   DM (diabetes mellitus) ([name])    ESRD (end stage renal disease) ([name])    FUO (fever of unknown origin) 09/17/2019   Hyperlipidemia    Hypertension    Osteomyelitis ([place])    Peripheral arterial disease ([name])    PVD (peripheral vascular disease) ([place])    Renal disorder 2015   right kidney transplant   Stroke [facility])    Vascular dementia [facility])     Past Surgical History:  Procedure Laterality Date   ABDOMINAL AORTOGRAM N/A 08/05/2020   Procedure: ABDOMINAL AORTOGRAM;  Surgeon: [name], MD;  Location: [place] CV LAB;  Service: Cardiovascular;  Laterality: N/A;   ABDOMINAL AORTOGRAM W/LOWER EXTREMITY N/A 09/09/2020   Procedure: ABDOMINAL AORTOGRAM W/LOWER EXTREMITY;  Surgeon: [name], MD;  Location: [place] CV LAB;  Service: Cardiovascular;  Laterality: N/A;   AMPUTATION Right 08/06/2020   Procedure: Right Fifth Toe Ray  Amputation;  Surgeon: [name], MD;  Location: [place];  Service: Vascular;  Laterality: Right;   AMPUTATION Right 11/02/2020   Procedure: RIGHT AMPUTATION BELOW KNEE;  Surgeon: [name], MD;  Location: [place];  Service: Vascular;  Laterality: Right;   BACK SURGERY     Has had 2 back surgeries   BUBBLE STUDY  09/18/2019   Procedure: BUBBLE STUDY;  Surgeon: Buford Dresser, MD;  Location: [place];  Service: Cardiovascular;;   Depression     ENDARTERECTOMY FEMORAL Right 08/06/2020   Procedure: Right Ilio- Femoral Artery Endarterectomy;  Surgeon: [name], MD;  Location: [facility] OR;  Service: Vascular;  Laterality: Right;   HAND SURGERY Right    HAND SURGERY     IR REMOVAL TUN CV CATH W/O [facility]  09/03/2019   KIDNEY TRANSPLANT     November 2015   LEG AMPUTATION BELOW KNEE Right    LOWER EXTREMITY ANGIOGRAPHY Bilateral 08/05/2020   Procedure: LOWER EXTREMITY ANGIOGRAPHY;  Surgeon: [name], MD;  Location: [place] CV LAB;  Service: Cardiovascular;  Laterality: Bilateral;   Memory loss     NEPHRECTOMY TRANSPLANTED ORGAN     PATCH ANGIOPLASTY Right 08/06/2020   Procedure: PATCH ANGIOPLASTY;  Surgeon: [name], MD;  Location: [place];  Service: Vascular;  Laterality: Right;   PERIPHERAL  VASCULAR BALLOON ANGIOPLASTY Right 09/09/2020   Procedure: PERIPHERAL VASCULAR BALLOON ANGIOPLASTY;  Surgeon: [name], MD;  Location: [place] CV LAB;  Service: Cardiovascular;  Laterality: Right;  popliteal   PERIPHERAL VASCULAR INTERVENTION Right 08/05/2020   Procedure: PERIPHERAL VASCULAR INTERVENTION;  Surgeon: [name], MD;  Location: [place] CV LAB;  Service: Cardiovascular;  Laterality: Right;  SFA   PERIPHERAL VASCULAR INTERVENTION Right 09/09/2020   Procedure: PERIPHERAL VASCULAR INTERVENTION;  Surgeon: [name], MD;  Location: [place] CV LAB;  Service: Cardiovascular;  Laterality: Right;  external iliac   TEE WITHOUT  CARDIOVERSION N/A 09/18/2019   Procedure: TRANSESOPHAGEAL ECHOCARDIOGRAM (TEE);  Surgeon: Buford Dresser, MD;  Location: [facility] ENDOSCOPY;  Service: Cardiovascular;  Laterality: N/A;   WOUND DEBRIDEMENT Right 10/08/2020   Procedure: DEBRIDEMENT WOUND RIGHT FOOT;  Surgeon: [name], MD;  Location: [place];  Service: Vascular;  Laterality: Right;    There were no vitals filed for this visit.   Subjective Assessment - 03/30/21 1325     Subjective he wore prosthesis 2-3 hrs one time per day on nondialysis days.  Wife reports doing HEP some at sink.    Patient is accompained by: Family member   wife   Pertinent History rt TTA, DM2, HTN, PVD, ESRD w/dialysis, hx right kidney transplant Nov 2015, Stroke, back sg X 2, memory issues    Limitations Lifting;Walking;House hold activities    Patient Stated Goals use prosthesis to get around house & community, be able to get in & out of high truck. be able to go to bathroom without assistance.    Currently in Pain? No/denies                               [facility] Adult PT Treatment/Exercise - 03/30/21 1326       Prosthetics   Prosthetic Care Comments  PT instructed donning prosthesis including facilitating patient involvement.  PT instructed in wear 2hrs 2-3 times per day on non-dialysis days & 1x on dialysis days. PT instructed in wearing prosthesis with dialysis including issues and recommendation to wait until wear enables weigh-in & out with prosthesis.  Patient's prosthesis had excessive toe-out from loose screw.  PT contacted [name] Righter, [facility] who is seeing pt immediately after PT.    Current prosthetic wear tolerance (days/week)  3 of 5 days since last PT session    Current prosthetic wear tolerance (#hours/day)  2hrs    Residual limb condition  no open areas, normal color & temperature, cylinderical shape.    Education Provided Skin check;Proper Donning;Proper wear schedule/adjustment;Other (comment)   see prosthetic care  comments   Person(s) Educated Patient;Spouse    Education Method Explanation;Verbal cues;Demonstration;Tactile cues    Education Method Verbalized understanding;Tactile cues required;Verbal cues required;Needs further instruction    Donning Prosthesis Moderate assist                      PT Short Term Goals - 03/11/21 1412       PT SHORT TERM GOAL #1   Title Wife donnes prosthesis correctly & verbalizes proper cleaning.    Time 4    Period Weeks    Status New    Target Date 04/15/21      PT SHORT TERM GOAL #2   Title Patient tolerates prosthesis >8 hrs total on nondialysis days & >4 hours if out of bed on dialysis days without skin  issues or limb pain    Time 4    Period Weeks    Status New    Target Date 04/15/21      PT SHORT TERM GOAL #3   Title Patient able to sit to & from stand w/c or chair with armrests to RW with supervision.    Time 4    Period Weeks    Status New    Target Date 04/15/21      PT SHORT TERM GOAL #4   Title Patient ambulates 100' with RW & prosthesis with minA.    Time 4    Period Weeks    Status New    Target Date 04/15/21      PT SHORT TERM GOAL #5   Title Patient able to negotiate curbs with RW with modA.    Time 4    Period Weeks    Status New    Target Date 04/15/21               PT Long Term Goals - 03/11/21 1408       PT LONG TERM GOAL #1   Title Patient's wife demonstrates & verbalized understanding of prosthetic care to enable safe utilization of prosthesis.    Time 12    Period Weeks    Status New    Target Date 06/10/21      PT LONG TERM GOAL #2   Title Patient tolerates prosthesis wear >90% of awake hours without skin or limb pain issues.    Time 12    Period Weeks    Status New    Target Date 06/10/21      PT LONG TERM GOAL #3   Title Patient able to perform standing ADLs like managing pants to toilet with RW & prosthesis with supervision.    Time 12    Period Weeks    Status New    Target Date  06/10/21      PT LONG TERM GOAL #4   Title Patient ambulates 200' with RW or rollator walker & prosthesis with wife's supervision.    Time 12    Period Weeks    Status New    Target Date 06/10/21      PT LONG TERM GOAL #5   Title Patient negotiates ramps & curbs with RW or rollator walker & prosthesis with minA from wife.    Time 12    Period Weeks    Status New    Target Date 06/10/21                   Plan - 03/30/21 1801     Clinical Impression Statement Patient was late to session which limited session.  Patient's prosthesis was excessively toed-out which was not safe for standing or gait.  PT instructed in donning, wear & fall risk when prosthesis off. Patient's wife verbalized understanding.    Personal Factors and Comorbidities Comorbidity 3+;Fitness;Time since onset of injury/illness/exacerbation    Comorbidities rt TTA, DM2, HTN, PVD, ESRD w/dialysis, hx right kidney transplant Nov 2015, Stroke, back sg X 2, memory loss    Examination-Activity Limitations Stand;Transfers;Toileting;Stairs;Locomotion Level    Examination-Participation Restrictions Community Activity;Other   household mobility & ADLs   Stability/Clinical Decision Making Evolving/Moderate complexity    Rehab Potential Good    PT Frequency 2x / week    PT Duration 12 weeks    PT Treatment/Interventions ADLs/Self Care Home Management;DME Instruction;Gait training;Stair training;Functional mobility training;Therapeutic activities;Therapeutic exercise;Balance training;Neuromuscular re-education;Patient/family  education;Prosthetic Training;Vestibular    PT Next Visit Plan check wear & increase, if wife comes to PT session review HEP,  work on prosthetic gait with RW & standing balance activities, Nustep    Consulted and Agree with Plan of Care Patient    Family Member Consulted wife, Finian Helvey             Patient will benefit from skilled therapeutic intervention in order to improve the following  deficits and impairments:  Abnormal gait, Decreased activity tolerance, Decreased balance, Decreased endurance, Decreased knowledge of use of DME, Decreased mobility, Decreased strength, Increased edema, Impaired flexibility, Postural dysfunction, Prosthetic Dependency  Visit Diagnosis: Other abnormalities of gait and mobility  Unsteadiness on feet  Abnormal posture  Muscle weakness (generalized)     Problem List Patient Active Problem List   Diagnosis Date Noted   Osteomyelitis ([place]) 10/30/2020   Encounter for general adult medical examination without abnormal findings 09/15/2020   Hemiplegia of dominant side ([place]) 09/15/2020   History of cerebrovascular accident 09/15/2020   History of COVID-19 09/15/2020   Hypercoagulable state ([place]) 09/15/2020   Other chronic osteomyelitis, right ankle and foot ([place]) 09/15/2020   Right flank pain 09/15/2020   Atherosclerosis of native arteries of the extremities with ulceration ([place]) 08/05/2020   Encounter for removal of sutures 07/27/2020   Neuropathic ulcer of foot, unspecified laterality, limited to breakdown of skin ([place]) 06/09/2020   Anemia 01/16/2020   Headache, unspecified 12/03/2019   Other disorders of phosphorus metabolism 10/25/2019   Unspecified protein-calorie malnutrition ([place]) 09/27/2019   FUO (fever of unknown origin) 09/17/2019   Cerebral thrombosis with cerebral infarction 08/02/2019   Cerebral embolism with cerebral infarction 08/02/2019   Acute respiratory failure due to COVID-19 [facility]) 07/30/2019   Acute respiratory failure with hypoxia ([place]) 07/30/2019   Allergy, unspecified, initial encounter 92/06/9416   Chronic systolic HF (heart failure) ([place]) 02/25/2019   ESRD (end stage renal disease) on dialysis ([place]) 02/25/2019   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 kidney disease, or end stage renal disease ([place]) 12/04/2018   Hypoglycemia, unspecified 08/17/2018    Generalized abdominal pain 07/16/2018   Hypercalcemia 06/28/2018   Arteriovenous fistula for hemodialysis in place, primary ([place]) 05/15/2018   Failed kidney transplant 05/15/2018   Anemia in chronic kidney disease 05/13/2018   Idiopathic gout, unspecified site 40/81/4481   Complication of vascular dialysis catheter 01/16/2018   Other specified coagulation defects ([place]) 01/16/2018   Pain, unspecified 01/16/2018   Pruritus, unspecified 01/16/2018   Secondary hyperparathyroidism of renal origin (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 11/28/2017   History of MI (myocardial infarction) 11/28/2017   Hypertension 11/28/2017   Chronic hepatitis C without hepatic coma ([place]) 09/25/2017   Diabetes mellitus type 2, uncomplicated ([place]) 85/63/1497   Myocardial infarction ([place]) 09/25/2017   Renal transplant recipient 09/25/2017   Transfusion history 09/25/2017   Vascular dementia without behavioral disturbance ([place]) 03/10/2017   History of coronary artery disease 09/12/2016   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 MI ([place]) 09/12/2016   Acute coronary thrombosis not resulting in myocardial infarction (  [place]) 09/12/2016   Aftercare following organ transplant 09/11/2016   Mineral metabolism disorder 09/11/2016   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 MRN: [id number] Date of Birth: 1951-02-05

## 2021-04-01 ENCOUNTER — Ambulatory Visit: Payer: Medicare Other | Admitting: Interventional Cardiology

## 2021-04-06 ENCOUNTER — Ambulatory Visit (INDEPENDENT_AMBULATORY_CARE_PROVIDER_SITE_OTHER): Payer: Medicare Other | Admitting: Physical Therapy

## 2021-04-06 ENCOUNTER — Other Ambulatory Visit: Payer: Self-pay

## 2021-04-06 ENCOUNTER — Encounter: Payer: Self-pay | Admitting: Physical Therapy

## 2021-04-06 DIAGNOSIS — R2689 Other abnormalities of gait and mobility: Secondary | ICD-10-CM | POA: Diagnosis not present

## 2021-04-06 DIAGNOSIS — R2681 Unsteadiness on feet: Secondary | ICD-10-CM | POA: Diagnosis not present

## 2021-04-06 DIAGNOSIS — M6281 Muscle weakness (generalized): Secondary | ICD-10-CM

## 2021-04-06 DIAGNOSIS — R293 Abnormal posture: Secondary | ICD-10-CM

## 2021-04-07 NOTE — Therapy (Signed)
[facility] Physical Therapy [address] [place], [place], [address] Phone: [phone]   Fax:  [phone]  Physical Therapy Treatment  Patient Details  Name: Jonathan KEMPKER Sr. MRN: [id number] Date of Birth: January 31, 1951 Referring Provider (PT): Jamelle Haring, MD   Encounter Date: 04/06/2021   PT End of Session - 04/06/21 1259     Visit Number 4    Number of Visits 25    Date for PT Re-Evaluation 06/10/21    Authorization Type BCBS Medicare    Authorization Time Period OOP & Ded met, $0 co-pay    Progress Note Due on Visit 10    PT Start Time 1259    PT Stop Time 1344    PT Time Calculation (min) 45 min    Equipment Utilized During Treatment Gait belt    Activity Tolerance Patient tolerated treatment well;Other (comment)   limited by cognitive deficits / memory   Behavior During Therapy WFL for tasks assessed/performed             Past Medical History:  Diagnosis Date   CAD (coronary artery disease)    CHF (congestive heart failure) ([name])    Diabetes mellitus without complication ([place])    type 2   DM (diabetes mellitus) ([name])    ESRD (end stage renal disease) ([name])    FUO (fever of unknown origin) 09/17/2019   Hyperlipidemia    Hypertension    Osteomyelitis ([place])    Peripheral arterial disease ([name])    PVD (peripheral vascular disease) ([place])    Renal disorder 2015   right kidney transplant   Stroke [facility])    Vascular dementia [facility])     Past Surgical History:  Procedure Laterality Date   ABDOMINAL AORTOGRAM N/A 08/05/2020   Procedure: ABDOMINAL AORTOGRAM;  Surgeon: [name], MD;  Location: [place] CV LAB;  Service: Cardiovascular;  Laterality: N/A;   ABDOMINAL AORTOGRAM W/LOWER EXTREMITY N/A 09/09/2020   Procedure: ABDOMINAL AORTOGRAM W/LOWER EXTREMITY;  Surgeon: [name], MD;  Location: [place] CV [facility];  Service: Cardiovascular;  Laterality: N/A;   AMPUTATION Right 08/06/2020   Procedure: Right Fifth Toe Ray  Amputation;  Surgeon: [name], MD;  Location: [place];  Service: Vascular;  Laterality: Right;   AMPUTATION Right 11/02/2020   Procedure: RIGHT AMPUTATION BELOW KNEE;  Surgeon: [name], MD;  Location: [place];  Service: Vascular;  Laterality: Right;   BACK SURGERY     Has had 2 back surgeries   BUBBLE STUDY  09/18/2019   Procedure: BUBBLE STUDY;  Surgeon: Buford Dresser, MD;  Location: [place];  Service: Cardiovascular;;   Depression     ENDARTERECTOMY FEMORAL Right 08/06/2020   Procedure: Right Ilio- Femoral Artery Endarterectomy;  Surgeon: [name], MD;  Location: [facility] OR;  Service: Vascular;  Laterality: Right;   HAND SURGERY Right    HAND SURGERY     IR REMOVAL TUN CV CATH W/O [facility]  09/03/2019   KIDNEY TRANSPLANT     November 2015   LEG AMPUTATION BELOW KNEE Right    LOWER EXTREMITY ANGIOGRAPHY Bilateral 08/05/2020   Procedure: LOWER EXTREMITY ANGIOGRAPHY;  Surgeon: [name], MD;  Location: [place] [facility];  Service: Cardiovascular;  Laterality: Bilateral;   Memory loss     NEPHRECTOMY TRANSPLANTED ORGAN     PATCH ANGIOPLASTY Right 08/06/2020   Procedure: PATCH ANGIOPLASTY;  Surgeon: [name], MD;  Location: [place];  Service: Vascular;  Laterality: Right;   PERIPHERAL  VASCULAR BALLOON ANGIOPLASTY Right 09/09/2020   Procedure: PERIPHERAL VASCULAR BALLOON ANGIOPLASTY;  Surgeon: [name], MD;  Location: [place] [facility];  Service: Cardiovascular;  Laterality: Right;  popliteal   PERIPHERAL VASCULAR INTERVENTION Right 08/05/2020   Procedure: PERIPHERAL VASCULAR INTERVENTION;  Surgeon: [name], MD;  Location: [place] [facility];  Service: Cardiovascular;  Laterality: Right;  SFA   PERIPHERAL VASCULAR INTERVENTION Right 09/09/2020   Procedure: PERIPHERAL VASCULAR INTERVENTION;  Surgeon: [name], MD;  Location: [place] [facility] LAB;  Service: Cardiovascular;  Laterality: Right;  external iliac   TEE WITHOUT  CARDIOVERSION N/A 09/18/2019   Procedure: TRANSESOPHAGEAL ECHOCARDIOGRAM (TEE);  Surgeon: Buford Dresser, MD;  Location: [facility] ENDOSCOPY;  Service: Cardiovascular;  Laterality: N/A;   WOUND DEBRIDEMENT Right 10/08/2020   Procedure: DEBRIDEMENT WOUND RIGHT FOOT;  Surgeon: [name], MD;  Location: [place];  Service: Vascular;  Laterality: Right;    There were no vitals filed for this visit.   Subjective Assessment - 04/06/21 1259     Subjective He is wearing prosthesis on nondialysis days 3hrs 2x/day and has gotten prosthesis on limb after dialysis a couple of times for 1-2 hours.    Patient is accompained by: Family member   wife   Pertinent History rt TTA, DM2, HTN, PVD, ESRD w/dialysis, hx right kidney transplant Nov 2015, Stroke, back sg X 2, memory issues    Limitations Lifting;Walking;House hold activities    Patient Stated Goals use prosthesis to get around house & community, be able to get in & out of high truck. be able to go to bathroom without assistance.    Currently in Pain? No/denies                               [facility] Adult PT Treatment/Exercise - 04/06/21 1300       Transfers   Transfers Sit to Stand;Stand to [employer];Lateral/Scoot Transfers    Sit to Stand 4: Min guard;With upper extremity assist;From chair/3-in-1   chairs with & without armrests   Sit to Stand Details Verbal cues for technique    Stand to Sit 4: Min guard;With upper extremity assist;To chair/3-in-1   chairs with & without armrests   Stand to Sit Details (indicate cue type and reason) Verbal cues for technique    Lateral/Scoot Transfers 5: Supervision      Ambulation/Gait   Ambulation/Gait Yes    Ambulation/Gait Assistance 4: Min assist    Ambulation/Gait Assistance Details tactile & verbal cues on upright posture, step width & wt shift over prosthesis.    Ambulation Distance (Feet) 60 Feet   30' X 2, 60'   Assistive device Rolling walker;Prosthesis     Ambulation Surface Level;Indoor    Ramp 3: Mod assist   RW & TTA prosthesis   Ramp Details (indicate cue type and reason) demo, verbal & tactile cues on technique    Curb 3: Mod assist   RW & TTA prosthesis   Curb Details (indicate cue type and reason) demo, verbal & tactile cues on technique      Neuro Re-ed    Neuro Re-ed Details  PT attempted head turns standing solid surface EO but pt unable to follow directions.      Knee/Hip Exercises: Aerobic   Nustep Seat 11 level 5 with BLEs & BUEs for 8 minutes.      Knee/Hip Exercises: Seated   Sit to [employer] Other (  comment)   attempted from 24" bar stool but unable without significant UE assist.     Prosthetics   Prosthetic Care Comments  PT recommended increasing to 4 hrs 2x/day on nondialysis days & up to 4hrs after nap on dialysis days.    Current prosthetic wear tolerance (days/week)  5 of 7days since last PT session    Current prosthetic wear tolerance (#hours/day)  6 hrs total on nondialysis days    Current prosthetic weight-bearing tolerance (hours/day)  Patient tolerated standing for 5 min with partial weight on prosthesis with no c/o pain or discomfort.    Residual limb condition  no open areas, normal color & temperature, cylinderical shape.    Education Provided Skin check;Proper Donning;Proper wear schedule/adjustment;Other (comment)   see prosthetic care comments   Person(s) Educated Patient;Spouse    Education Method Explanation;Tactile cues;Verbal cues    Education Method Verbalized understanding;Tactile cues required;Verbal cues required;Needs further instruction    Donning Prosthesis Moderate assist                      PT Short Term Goals - 03/11/21 1412       PT SHORT TERM GOAL #1   Title Wife donnes prosthesis correctly & verbalizes proper cleaning.    Time 4    Period Weeks    Status New    Target Date 04/15/21      PT SHORT TERM GOAL #2   Title Patient tolerates prosthesis >8 hrs total on nondialysis  days & >4 hours if out of bed on dialysis days without skin issues or limb pain    Time 4    Period Weeks    Status New    Target Date 04/15/21      PT SHORT TERM GOAL #3   Title Patient able to sit to & from stand w/c or chair with armrests to RW with supervision.    Time 4    Period Weeks    Status New    Target Date 04/15/21      PT SHORT TERM GOAL #4   Title Patient ambulates 100' with RW & prosthesis with minA.    Time 4    Period Weeks    Status New    Target Date 04/15/21      PT SHORT TERM GOAL #5   Title Patient able to negotiate curbs with RW with modA.    Time 4    Period Weeks    Status New    Target Date 04/15/21               PT Long Term Goals - 03/11/21 1408       PT LONG TERM GOAL #1   Title Patient's wife demonstrates & verbalized understanding of prosthetic care to enable safe utilization of prosthesis.    Time 12    Period Weeks    Status New    Target Date 06/10/21      PT LONG TERM GOAL #2   Title Patient tolerates prosthesis wear >90% of awake hours without skin or limb pain issues.    Time 12    Period Weeks    Status New    Target Date 06/10/21      PT LONG TERM GOAL #3   Title Patient able to perform standing ADLs like managing pants to toilet with RW & prosthesis with supervision.    Time 12    Period Weeks    Status New  Target Date 06/10/21      PT LONG TERM GOAL #4   Title Patient ambulates 200' with RW or rollator walker & prosthesis with wife's supervision.    Time 12    Period Weeks    Status New    Target Date 06/10/21      PT LONG TERM GOAL #5   Title Patient negotiates ramps & curbs with RW or rollator walker & prosthesis with minA from wife.    Time 12    Period Weeks    Status New    Target Date 06/10/21                   Plan - 04/06/21 1259     Clinical Impression Statement Patient required less assistance for prosthetic gait with RW today but significant cues.  PT introduced technique to  negotiate ramps & curbs which he did well for first time but will need further training.    Personal Factors and Comorbidities Comorbidity 3+;Fitness;Time since onset of injury/illness/exacerbation    Comorbidities rt TTA, DM2, HTN, PVD, ESRD w/dialysis, hx right kidney transplant Nov 2015, Stroke, back sg X 2, memory loss    Examination-Activity Limitations Stand;Transfers;Toileting;Stairs;Locomotion Level    Examination-Participation Restrictions Community Activity;Other   household mobility & ADLs   Stability/Clinical Decision Making Evolving/Moderate complexity    Rehab Potential Good    PT Frequency 2x / week    PT Duration 12 weeks    PT Treatment/Interventions ADLs/Self Care Home Management;DME Instruction;Gait training;Stair training;Functional mobility training;Therapeutic activities;Therapeutic exercise;Balance training;Neuromuscular re-education;Patient/family education;Prosthetic Training;Vestibular    PT Next Visit Plan check wear & increase, iwork on prosthetic gait with RW & standing balance activities, Nustep    Consulted and Agree with Plan of Care Patient    Family Member Consulted wife, [employer] [place]             Patient will benefit from skilled therapeutic intervention in order to improve the following deficits and impairments:  Abnormal gait, Decreased activity tolerance, Decreased balance, Decreased endurance, Decreased knowledge of use of DME, Decreased mobility, Decreased strength, Increased edema, Impaired flexibility, Postural dysfunction, Prosthetic Dependency  Visit Diagnosis: Other abnormalities of gait and mobility  Unsteadiness on feet  Abnormal posture  Muscle weakness (generalized)     Problem List Patient Active Problem List   Diagnosis Date Noted   Osteomyelitis ([place]) 10/30/2020   Encounter for general adult medical examination without abnormal findings 09/15/2020   Hemiplegia of dominant side ([place]) 09/15/2020   History of cerebrovascular  accident 09/15/2020   History of COVID-19 09/15/2020   Hypercoagulable state ([place]) 09/15/2020   Other chronic osteomyelitis, right ankle and foot ([place]) 09/15/2020   Right flank pain 09/15/2020   Atherosclerosis of native arteries of the extremities with ulceration ([place]) 08/05/2020   Encounter for removal of sutures 07/27/2020   Neuropathic ulcer of foot, unspecified laterality, limited to breakdown of skin ([place]) 06/09/2020   Anemia 01/16/2020   Headache, unspecified 12/03/2019   Other disorders of phosphorus metabolism 10/25/2019   Unspecified protein-calorie malnutrition ([place]) 09/27/2019   FUO (fever of unknown origin) 09/17/2019   Cerebral thrombosis with cerebral infarction 08/02/2019   Cerebral embolism with cerebral infarction 08/02/2019   Acute respiratory failure due to COVID-19 ([place]) 07/30/2019   Acute respiratory failure with hypoxia ([place]) 07/30/2019   Allergy, unspecified, initial encounter 24/82/5003   Chronic systolic HF (heart failure) ([place]) 02/25/2019   ESRD (end stage renal disease) on dialysis ([place]) 02/25/2019   Hypertensive heart and chronic  kidney disease with heart failure and with stage 5 chronic kidney disease, or end stage renal disease ([place]) 12/04/2018   Hypoglycemia, unspecified 08/17/2018   Generalized abdominal pain 07/16/2018   Hypercalcemia 06/28/2018   Arteriovenous fistula for hemodialysis in place, primary ([place]) 05/15/2018   Failed kidney transplant 05/15/2018   Anemia in chronic kidney disease 05/13/2018   Idiopathic gout, unspecified site 66/01/3015   Complication of vascular dialysis catheter 01/16/2018   Other specified coagulation defects ([place]) 01/16/2018   Pain, unspecified 01/16/2018   Pruritus, unspecified 01/16/2018   Secondary hyperparathyroidism of renal origin ([place]) 01/16/2018   Shortness of breath 01/16/2018   Type 2 diabetes mellitus with diabetic peripheral angiopathy without gangrene ([place]) 01/16/2018   Abnormal gait 01/10/2018    Dependence on renal dialysis ([place]) 01/10/2018   Iron deficiency anemia, unspecified 12/21/2017   Acute blood loss anemia 12/14/2017   Hypomagnesemia 12/03/2017   Anticoagulated 12/02/2017   Stroke ([place]) 11/29/2017   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 11/28/2017   History of MI (myocardial infarction) 11/28/2017   Hypertension 11/28/2017   Chronic hepatitis C without hepatic coma ([place]) 09/25/2017   Diabetes mellitus type 2, uncomplicated ([name]) 08/16/3233   Myocardial infarction ([place]) 09/25/2017   Renal transplant recipient 09/25/2017   Transfusion history 09/25/2017   Vascular dementia without behavioral disturbance ([place]) 03/10/2017   History of coronary artery disease 09/12/2016   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 MI ([place]) 09/12/2016   Acute coronary thrombosis not resulting in myocardial infarction ([place]) 09/12/2016   Aftercare following organ transplant 09/11/2016   Mineral metabolism disorder 09/11/2016   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 MRN: [id number] Date of Birth: 07/28/1951

## 2021-04-08 ENCOUNTER — Other Ambulatory Visit: Payer: Self-pay

## 2021-04-08 ENCOUNTER — Ambulatory Visit (INDEPENDENT_AMBULATORY_CARE_PROVIDER_SITE_OTHER): Payer: Medicare Other | Admitting: Physical Therapy

## 2021-04-08 DIAGNOSIS — R2681 Unsteadiness on feet: Secondary | ICD-10-CM

## 2021-04-08 DIAGNOSIS — R2689 Other abnormalities of gait and mobility: Secondary | ICD-10-CM

## 2021-04-08 DIAGNOSIS — R293 Abnormal posture: Secondary | ICD-10-CM

## 2021-04-08 DIAGNOSIS — M6281 Muscle weakness (generalized): Secondary | ICD-10-CM

## 2021-04-08 NOTE — Therapy (Signed)
[facility] [address] [place], [place], [address] Phone: [phone]   Fax:  [phone]  Physical Therapy Treatment  Patient Details  Name: Jonathan NAVARRA Sr. MRN: [id number] Date of Birth: Dec 17, 1950 Referring Provider (PT): Jamelle Haring, MD   Encounter Date: 04/08/2021   PT End of Session - 04/08/21 1321     Visit Number 5    Number of Visits 25    Date for PT Re-Evaluation 06/10/21    Authorization Type BCBS Medicare    Authorization Time Period OOP & Ded met, $0 co-pay    Progress Note Due on Visit 10    PT Start Time 1155   arrives 10 minutes late   PT Stop Time 1233    PT Time Calculation (min) 38 min    Equipment Utilized During Treatment Gait belt    Activity Tolerance Patient tolerated treatment well;Other (comment)   limited by cognitive deficits / memory   Behavior During Therapy WFL for tasks assessed/performed             Past Medical History:  Diagnosis Date   CAD (coronary artery disease)    CHF (congestive heart failure) ([name])    Diabetes mellitus without complication ([place])    type 2   DM (diabetes mellitus) ([name])    ESRD (end stage renal disease) ([name])    FUO (fever of unknown origin) 09/17/2019   Hyperlipidemia    Hypertension    Osteomyelitis ([place])    Peripheral arterial disease ([name])    PVD (peripheral vascular disease) ([place])    Renal disorder 2015   right kidney transplant   Stroke [facility])    Vascular dementia [facility])     Past Surgical History:  Procedure Laterality Date   ABDOMINAL AORTOGRAM N/A 08/05/2020   Procedure: ABDOMINAL AORTOGRAM;  Surgeon: [name], MD;  Location: [place] [facility];  Service: Cardiovascular;  Laterality: N/A;   ABDOMINAL AORTOGRAM W/LOWER EXTREMITY N/A 09/09/2020   Procedure: ABDOMINAL AORTOGRAM W/LOWER EXTREMITY;  Surgeon: [name], MD;  Location: [place] [facility];  Service: Cardiovascular;  Laterality: N/A;   AMPUTATION Right 08/06/2020   Procedure:  Right Fifth Toe Ray Amputation;  Surgeon: [name], MD;  Location: [place];  Service: Vascular;  Laterality: Right;   AMPUTATION Right 11/02/2020   Procedure: RIGHT AMPUTATION BELOW KNEE;  Surgeon: [name], MD;  Location: [place];  Service: Vascular;  Laterality: Right;   BACK SURGERY     Has had 2 back surgeries   BUBBLE STUDY  09/18/2019   Procedure: BUBBLE STUDY;  Surgeon: Buford Dresser, MD;  Location: [place];  Service: Cardiovascular;;   Depression     ENDARTERECTOMY FEMORAL Right 08/06/2020   Procedure: Right Ilio- Femoral Artery Endarterectomy;  Surgeon: [name], MD;  Location: [facility] OR;  Service: Vascular;  Laterality: Right;   HAND SURGERY Right    HAND SURGERY     IR REMOVAL TUN CV CATH W/O [facility]  09/03/2019   KIDNEY TRANSPLANT     November 2015   LEG AMPUTATION BELOW KNEE Right    LOWER EXTREMITY ANGIOGRAPHY Bilateral 08/05/2020   Procedure: LOWER EXTREMITY ANGIOGRAPHY;  Surgeon: [name], MD;  Location: [place] [facility];  Service: Cardiovascular;  Laterality: Bilateral;   Memory loss     NEPHRECTOMY TRANSPLANTED ORGAN     PATCH ANGIOPLASTY Right 08/06/2020   Procedure: PATCH ANGIOPLASTY;  Surgeon: [name], MD;  Location: [place];  Service: Vascular;  Laterality: Right;   PERIPHERAL VASCULAR BALLOON ANGIOPLASTY Right 09/09/2020   Procedure: PERIPHERAL VASCULAR BALLOON ANGIOPLASTY;  Surgeon: [name], MD;  Location: [place] [facility];  Service: Cardiovascular;  Laterality: Right;  popliteal   PERIPHERAL VASCULAR INTERVENTION Right 08/05/2020   Procedure: PERIPHERAL VASCULAR INTERVENTION;  Surgeon: [name], MD;  Location: [place] [facility];  Service: Cardiovascular;  Laterality: Right;  SFA   PERIPHERAL VASCULAR INTERVENTION Right 09/09/2020   Procedure: PERIPHERAL VASCULAR INTERVENTION;  Surgeon: [name], MD;  Location: [place] [facility];  Service: Cardiovascular;  Laterality: Right;  external iliac    TEE WITHOUT CARDIOVERSION N/A 09/18/2019   Procedure: TRANSESOPHAGEAL ECHOCARDIOGRAM (TEE);  Surgeon: Buford Dresser, MD;  Location: [facility] ENDOSCOPY;  Service: Cardiovascular;  Laterality: N/A;   WOUND DEBRIDEMENT Right 10/08/2020   Procedure: DEBRIDEMENT WOUND RIGHT FOOT;  Surgeon: [name], MD;  Location: [place];  Service: Vascular;  Laterality: Right;    There were no vitals filed for this visit.   Subjective Assessment - 04/08/21 1309     Subjective He relays he is very tired today, he denies pain or any issues with prosthesis, his wife relays she is walking with him " a little bit"    Patient is accompained by: Family member   wife   Pertinent History rt TTA, DM2, HTN, PVD, ESRD w/dialysis, hx right kidney transplant Nov 2015, Stroke, back sg X 2, memory issues    Limitations Lifting;Walking;House hold activities    Patient Stated Goals use prosthesis to get around house & community, be able to get in & out of high truck. be able to go to bathroom without assistance.                               [place] Adult PT Treatment/Exercise - 04/08/21 0001       Transfers   Transfers Sit to Stand;Stand to [employer];Lateral/Scoot Transfers    Sit to Stand 4: Min guard;With upper extremity assist;From chair/3-in-1   chairs with & without armrests   Sit to Stand Details Verbal cues for technique    Stand to Sit 4: Min guard;With upper extremity assist;To chair/3-in-1   chairs with & without armrests   Stand to Sit Details (indicate cue type and reason) Verbal cues for technique    Lateral/Scoot Transfers 5: Supervision      Ambulation/Gait   Ambulation/Gait Yes    Ambulation/Gait Assistance 4: Min assist    Ambulation/Gait Assistance Details cues for longer step length to clear other foot    Ambulation Distance (Feet) 60 Feet   40' X 2, 60'   Assistive device Rolling walker;Prosthesis    Ramp 3: Mod assist   RW & TTA prosthesis   Ramp Details  (indicate cue type and reason) demo, verbal & tactile cues on technique    Curb 3: Mod assist   RW & TTA prosthesis   Curb Details (indicate cue type and reason) demo, verbal & tactile cues on technique      Neuro Re-ed    Neuro Re-ed Details  static standing balance with cues to shift more weight onto prosthesis, 1 minute X 3 without UE support      Exercises   Exercises Knee/Hip      Knee/Hip Exercises: Aerobic   Nustep Seat 11 level 6 with BLEs & BUEs for 8 minutes.      Knee/Hip Exercises: Seated  Sit to Sand 5 reps;with UE support   one hand on RW, one hand on mat table                     PT Short Term Goals - 03/11/21 1412       PT SHORT TERM GOAL #1   Title Wife donnes prosthesis correctly & verbalizes proper cleaning.    Time 4    Period Weeks    Status New    Target Date 04/15/21      PT SHORT TERM GOAL #2   Title Patient tolerates prosthesis >8 hrs total on nondialysis days & >4 hours if out of bed on dialysis days without skin issues or limb pain    Time 4    Period Weeks    Status New    Target Date 04/15/21      PT SHORT TERM GOAL #3   Title Patient able to sit to & from stand w/c or chair with armrests to RW with supervision.    Time 4    Period Weeks    Status New    Target Date 04/15/21      PT SHORT TERM GOAL #4   Title Patient ambulates 100' with RW & prosthesis with minA.    Time 4    Period Weeks    Status New    Target Date 04/15/21      PT SHORT TERM GOAL #5   Title Patient able to negotiate curbs with RW with modA.    Time 4    Period Weeks    Status New    Target Date 04/15/21               PT Long Term Goals - 03/11/21 1408       PT LONG TERM GOAL #1   Title Patient's wife demonstrates & verbalized understanding of prosthetic care to enable safe utilization of prosthesis.    Time 12    Period Weeks    Status New    Target Date 06/10/21      PT LONG TERM GOAL #2   Title Patient tolerates prosthesis wear  >90% of awake hours without skin or limb pain issues.    Time 12    Period Weeks    Status New    Target Date 06/10/21      PT LONG TERM GOAL #3   Title Patient able to perform standing ADLs like managing pants to toilet with RW & prosthesis with supervision.    Time 12    Period Weeks    Status New    Target Date 06/10/21      PT LONG TERM GOAL #4   Title Patient ambulates 200' with RW or rollator walker & prosthesis with wife's supervision.    Time 12    Period Weeks    Status New    Target Date 06/10/21      PT LONG TERM GOAL #5   Title Patient negotiates ramps & curbs with RW or rollator walker & prosthesis with minA from wife.    Time 12    Period Weeks    Status New    Target Date 06/10/21                   Plan - 04/08/21 1323     Clinical Impression Statement He needed cuing for proper technique for curb and ramp today as well as cues for proper step length  with ambuation. His activity tolerance was limited by fatigue and he needed frequent rest breaks. We will work to progress this as able.    Personal Factors and Comorbidities Comorbidity 3+;Fitness;Time since onset of injury/illness/exacerbation    Comorbidities rt TTA, DM2, HTN, PVD, ESRD w/dialysis, hx right kidney transplant Nov 2015, Stroke, back sg X 2, memory loss    Examination-Activity Limitations Stand;Transfers;Toileting;Stairs;Locomotion Level    Examination-Participation Restrictions Community Activity;Other   household mobility & ADLs   Stability/Clinical Decision Making Evolving/Moderate complexity    Rehab Potential Good    PT Frequency 2x / week    PT Duration 12 weeks    PT Treatment/Interventions ADLs/Self Care Home Management;DME Instruction;Gait training;Stair training;Functional mobility training;Therapeutic activities;Therapeutic exercise;Balance training;Neuromuscular re-education;Patient/family education;Prosthetic Training;Vestibular    PT Next Visit Plan continue to work to  prosthetic gait with RW & standing balance activities, Nustep, improving wear time    Consulted and Agree with Plan of Care Patient    Family Member Consulted wife, Linc Renne             Patient will benefit from skilled therapeutic intervention in order to improve the following deficits and impairments:  Abnormal gait, Decreased activity tolerance, Decreased balance, Decreased endurance, Decreased knowledge of use of DME, Decreased mobility, Decreased strength, Increased edema, Impaired flexibility, Postural dysfunction, Prosthetic Dependency  Visit Diagnosis: Other abnormalities of gait and mobility  Unsteadiness on feet  Abnormal posture  Muscle weakness (generalized)     Problem List Patient Active Problem List   Diagnosis Date Noted   Osteomyelitis ([place]) 10/30/2020   Encounter for general adult medical examination without abnormal findings 09/15/2020   Hemiplegia of dominant side ([place]) 09/15/2020   History of cerebrovascular accident 09/15/2020   History of COVID-19 09/15/2020   Hypercoagulable state ([place]) 09/15/2020   Other chronic osteomyelitis, right ankle and foot ([place]) 09/15/2020   Right flank pain 09/15/2020   Atherosclerosis of native arteries of the extremities with ulceration ([place]) 08/05/2020   Encounter for removal of sutures 07/27/2020   Neuropathic ulcer of foot, unspecified laterality, limited to breakdown of skin ([place]) 06/09/2020   Anemia 01/16/2020   Headache, unspecified 12/03/2019   Other disorders of phosphorus metabolism 10/25/2019   Unspecified protein-calorie malnutrition ([place]) 09/27/2019   FUO (fever of unknown origin) 09/17/2019   Cerebral thrombosis with cerebral infarction 08/02/2019   Cerebral embolism with cerebral infarction 08/02/2019   Acute respiratory failure due to COVID-19 [facility]) 07/30/2019   Acute respiratory failure with hypoxia ([place]) 07/30/2019   Allergy, unspecified, initial encounter 62/70/3500   Chronic systolic HF  (heart failure) ([place]) 02/25/2019   ESRD (end stage renal disease) on dialysis ([place]) 02/25/2019   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 kidney disease, or end stage renal disease ([place]) 12/04/2018   Hypoglycemia, unspecified 08/17/2018   Generalized abdominal pain 07/16/2018   Hypercalcemia 06/28/2018   Arteriovenous fistula for hemodialysis in place, primary ([place]) 05/15/2018   Failed kidney transplant 05/15/2018   Anemia in chronic kidney disease 05/13/2018   Idiopathic gout, unspecified site 93/81/8299   Complication of vascular dialysis catheter 01/16/2018   Other specified coagulation defects ([place]) 01/16/2018   Pain, unspecified 01/16/2018   Pruritus, unspecified 01/16/2018   Secondary hyperparathyroidism of renal origin ([place]) 01/16/2018   Shortness of breath 01/16/2018   Type 2 diabetes mellitus with diabetic peripheral angiopathy without gangrene ([place]) 01/16/2018   Abnormal gait 01/10/2018   Dependence on renal dialysis ([place]) 01/10/2018   Iron deficiency anemia,  unspecified 12/21/2017   Acute blood loss anemia 12/14/2017   Hypomagnesemia 12/03/2017   Anticoagulated 12/02/2017   Stroke ([place]) 11/29/2017   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 11/28/2017   History of MI (myocardial infarction) 11/28/2017   Hypertension 11/28/2017   Chronic hepatitis C without hepatic coma ([place]) 09/25/2017   Diabetes mellitus type 2, uncomplicated ([place]) 62/69/4854   Myocardial infarction ([place]) 09/25/2017   Renal transplant recipient 09/25/2017   Transfusion history 09/25/2017   Vascular dementia without behavioral disturbance ([place]) 03/10/2017   History of coronary artery disease 09/12/2016   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 MI ([place]) 09/12/2016   Acute coronary thrombosis not resulting in myocardial infarction ([place]) 09/12/2016   Aftercare following organ transplant 09/11/2016   Mineral metabolism disorder 09/11/2016    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 MRN: [id number] Date of Birth: Jun 09, 1951

## 2021-04-12 NOTE — Progress Notes (Signed)
[facility] OF [place]  ASSESSMENT / PLAN: Jonathan [name]. is a 70 y.o. 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 He is working with physical therapy and [facility]. Follow up with me as needed.  CHIEF COMPLAINT: status post right below knee amputation  HISTORY OF PRESENT ILLNESS: Jonathan [name]. is a 70 y.o. male who I know very well who returns for a wound check. Over the winter of 2021-2022, we tried to salvage his right foot. Unfortunately, despite successful revascularization, he required a right below knee amputation in March 2022. He is recovering from this well. He presents today with his wife for staple removal.   VASCULAR SURGICAL HISTORY:  08/05/20: R SFA stenting 08/07/20: R iliofemoral endarterectomy and fifth toe ray amputation 09/09/20: R external iliac stenting and popliteal angioplasty 10/08/20: debridement of right fifth toe amputation ulceration 11/02/20: R below knee amputation  Past Medical History:  Diagnosis Date   CAD (coronary artery disease)    CHF (congestive heart failure) ([name])    Diabetes mellitus without complication ([place])    type 2   DM (diabetes mellitus) ([place])    ESRD (end stage renal disease) ([place])    FUO (fever of unknown origin) 09/17/2019   Hyperlipidemia    Hypertension    Osteomyelitis ([place])    Peripheral arterial disease ([place])    PVD (peripheral vascular disease) ([place])    Renal disorder 2015   right kidney transplant   Stroke [facility])    Vascular dementia [facility])     Past Surgical History:  Procedure Laterality Date   ABDOMINAL AORTOGRAM N/A 08/05/2020   Procedure: ABDOMINAL AORTOGRAM;  Surgeon: [name], MD;  Location: [place] [facility];  Service: Cardiovascular;  Laterality: N/A;   ABDOMINAL AORTOGRAM W/LOWER EXTREMITY N/A 09/09/2020   Procedure: ABDOMINAL AORTOGRAM W/LOWER EXTREMITY;  Surgeon: [name],  MD;  Location: [place] [facility];  Service: Cardiovascular;  Laterality: N/A;   AMPUTATION Right 08/06/2020   Procedure: Right Fifth Toe Ray Amputation;  Surgeon: [name], MD;  Location: [place];  Service: Vascular;  Laterality: Right;   AMPUTATION Right 11/02/2020   Procedure: RIGHT AMPUTATION BELOW KNEE;  Surgeon: [name], MD;  Location: [place];  Service: Vascular;  Laterality: Right;   BACK SURGERY     Has had 2 back surgeries   BUBBLE STUDY  09/18/2019   Procedure: BUBBLE STUDY;  Surgeon: Buford Dresser, MD;  Location: [place];  Service: Cardiovascular;;   Depression     ENDARTERECTOMY FEMORAL Right 08/06/2020   Procedure: Right Ilio- Femoral Artery Endarterectomy;  Surgeon: [name], MD;  Location[facility] OR;  Service: Vascular;  Laterality: Right;   HAND SURGERY Right    HAND SURGERY     IR REMOVAL TUN CV CATH W/O [facility]  09/03/2019   KIDNEY TRANSPLANT     November 2015   LEG AMPUTATION BELOW KNEE Right    LOWER EXTREMITY ANGIOGRAPHY Bilateral 08/05/2020   Procedure: LOWER EXTREMITY ANGIOGRAPHY;  Surgeon: [name], MD;  Location: [place] [facility];  Service: Cardiovascular;  Laterality: Bilateral;   Memory loss     NEPHRECTOMY TRANSPLANTED ORGAN     PATCH ANGIOPLASTY Right 08/06/2020   Procedure: PATCH ANGIOPLASTY;  Surgeon: [name], MD;  Location: [place];  Service: Vascular;  Laterality: Right;   PERIPHERAL VASCULAR BALLOON ANGIOPLASTY Right 09/09/2020   Procedure: PERIPHERAL  VASCULAR BALLOON ANGIOPLASTY;  Surgeon: [name], MD;  Location: [place] [facility];  Service: Cardiovascular;  Laterality: Right;  popliteal   PERIPHERAL VASCULAR INTERVENTION Right 08/05/2020   Procedure: PERIPHERAL VASCULAR INTERVENTION;  Surgeon: [name], MD;  Location: [place] [facility];  Service: Cardiovascular;  Laterality: Right;  SFA   PERIPHERAL VASCULAR INTERVENTION Right 09/09/2020   Procedure: PERIPHERAL VASCULAR INTERVENTION;   Surgeon: [name], MD;  Location: [place] [facility];  Service: Cardiovascular;  Laterality: Right;  external iliac   TEE WITHOUT CARDIOVERSION N/A 09/18/2019   Procedure: TRANSESOPHAGEAL ECHOCARDIOGRAM (TEE);  Surgeon: Buford Dresser, MD;  Location: [facility] ENDOSCOPY;  Service: Cardiovascular;  Laterality: N/A;   WOUND DEBRIDEMENT Right 10/08/2020   Procedure: DEBRIDEMENT WOUND RIGHT FOOT;  Surgeon: [name], MD;  Location: [facility] OR;  Service: Vascular;  Laterality: Right;    Family History  Problem Relation Age of Onset   Diabetes Mellitus II Mother    Cancer Mother        STOMACH   Diabetes Mellitus II Brother    Other Father        UNKOWN   Healthy Daughter    Diabetes Son     Social History   Socioeconomic History   Marital status: Married    Spouse name: [name]   Number of children: Not on file   Years of education: Not on file   Highest education level: Not on file  Occupational History   Not on file  Tobacco Use   Smoking status: Former   Smokeless tobacco: Never  Scientific laboratory technician Use: Never used  Substance and Sexual Activity   Alcohol use: Never   Drug use: Never   Sexual activity: Not on file  Other Topics Concern   Not on file  Social History Narrative   Lives with wife, son, daughter in Sports coach and grandson   Right Handed   Drinks 1 cup caffeine daily   Social Determinants of Health   Financial Resource Strain: Not on file  Food Insecurity: Not on file  Transportation Needs: Not on file  Physical Activity: Not on file  Stress: Not on file  Social Connections: Not on file  Intimate Partner Violence: Not on file    No Known Allergies  Current Outpatient Medications  Medication Sig Dispense Refill   allopurinol (ZYLOPRIM) 100 MG tablet Take 100 mg by mouth at bedtime.     apixaban (ELIQUIS) 5 MG TABS tablet Take 5 mg by mouth 2 (two) times daily.     aspirin 81 MG chewable tablet Chew 81 mg by mouth daily.      atorvastatin  (LIPITOR) 40 MG tablet Take 40 mg by mouth daily.     b complex-vitamin c-folic acid (NEPHRO-VITE) 0.8 MG TABS tablet Take 1 tablet by mouth daily.     calcitRIOL (ROCALTROL) 0.5 MCG capsule Take 2 capsules (1 mcg total) by mouth every Monday, Wednesday, and Friday.     clopidogrel (PLAVIX) 75 MG tablet TAKE 1 TABLET (75 MG TOTAL) BY MOUTH DAILY WITH BREAKFAST. 30 tablet 3   feeding supplement (BOOST HIGH PROTEIN) LIQD Take 1 Container by mouth daily as needed (nutrition).     insulin aspart (NOVOLOG) 100 UNIT/ML injection Insulin sliding scale: Blood sugar  120-150   3units                       151-200   4units  201-250   7units                       251- 300  11units                       301-350   15uints                       351-400   20units                       >400         call MD immediately 10 mL 0   Insulin Pen Needle 32G X 4 MM MISC 1 each by Other route as needed (insulin).      levalbuterol (XOPENEX HFA) 45 MCG/ACT inhaler Inhale 2 puffs into the lungs every 8 (eight) hours as needed for wheezing. 1 Inhaler 12   lidocaine-prilocaine (EMLA) cream Apply 1 application topically 3 (three) times a week. 30 minutes prior to Dialysis - MWF  11   oxyCODONE-acetaminophen (PERCOCET/ROXICET) 5-325 MG tablet Take 1 tablet by mouth every 6 (six) hours as needed for moderate pain. 5 tablet 0   predniSONE (DELTASONE) 5 MG tablet Take 5 mg by mouth daily with breakfast.     senna-docusate (SENOKOT-S) 8.6-50 MG tablet Take 1 tablet by mouth 2 (two) times daily. Hold if diarrhea     sevelamer carbonate (RENVELA) 800 MG tablet Take 800 mg by mouth 3 (three) times daily with meals.     warfarin (COUMADIN) 6 MG tablet Take 6-9 mg by mouth See admin instructions. Take 9 mg on Tues, Thurs, and Sun. Take 6 mg on Sun, Mon, Wed, Fri, and Sat     No current facility-administered medications for this visit.    REVIEW OF SYSTEMS:  '[X]'$  denotes positive finding, '[ ]'$  denotes negative  finding Cardiac  Comments:  Chest pain or chest pressure:    Shortness of breath upon exertion:    Short of breath when lying flat:    Irregular heart rhythm:        Vascular    Pain in calf, thigh, or hip brought on by ambulation:    Pain in feet at night that wakes you up from your sleep:     Blood clot in your veins:    Leg swelling:         Pulmonary    Oxygen at home:    Productive cough:     Wheezing:         Neurologic    Sudden weakness in arms or legs:     Sudden numbness in arms or legs:     Sudden onset of difficulty speaking or slurred speech:    Temporary loss of vision in one eye:     Problems with dizziness:         Gastrointestinal    Blood in stool:     Vomited blood:         Genitourinary    Burning when urinating:     Blood in urine:        Psychiatric    Major depression:         Hematologic    Bleeding problems:    Problems with blood clotting too easily:        Skin    Rashes or ulcers:        Constitutional  Fever or chills:      PHYSICAL EXAM  Vitals:   04/13/21 1123  BP: 121/71  Pulse: 84  Resp: 20  Temp: 97.7 F (36.5 C)  SpO2: 100%  Weight: 160 lb (72.6 kg)  Height: '5\' 11"'$  (1.803 m)     Constitutional: chronically ill appearing. no distress. Appears well nourished.  Neurologic: CN intact. Unchanged expressive aphasia Psychiatric: Mood and affect symmetric and appropriate. Eyes: No icterus. No conjunctival pallor. Ears, nose, throat: mucous membranes moist. Midline trachea.  Cardiac: regular rate and rhythm.  Respiratory: unlabored. Abdominal: soft, non-tender, non-distended.  Peripheral vascular:  R BKA healed Extremity: No edema. No cyanosis. No pallor.  Skin: No gangrene. No ulceration.  Lymphatic: No Stemmer's sign. No palpable lymphadenopathy.  PERTINENT LABORATORY AND RADIOLOGIC DATA  Most recent CBC CBC Latest Ref Rng & Units 03/12/2021 11/06/2020 11/05/2020  WBC 4.0 - 10.5 K/uL 5.7 8.6 8.4  Hemoglobin 13.0  - 17.0 g/dL 11.0(L) 7.7(L) 8.3(L)  Hematocrit 39.0 - 52.0 % 34.9(L) 24.1(L) 26.9(L)  Platelets 150 - 400 K/uL 88(L) 240 219     Most recent CMP CMP Latest Ref Rng & Units 11/06/2020 11/05/2020 11/04/2020  Glucose 70 - 99 mg/dL 155(H) 145(H) 30(LL)  BUN 8 - 23 mg/dL 41(H) 26(H) 27(H)  Creatinine 0.61 - 1.24 mg/dL 6.88(H) 4.99(H) 5.00(H)  Sodium 135 - 145 mmol/L 132(L) 134(L) 136  Potassium 3.5 - 5.1 mmol/L 4.4 4.1 3.9  Chloride 98 - 111 mmol/L 98 97(L) 96(L)  CO2 22 - 32 mmol/L '27 28 29  '$ Calcium 8.9 - 10.3 mg/dL 8.8(L) 9.1 8.5(L)  Total Protein 6.5 - 8.1 g/dL - - -  Total Bilirubin 0.3 - 1.2 mg/dL - - -  Alkaline Phos 38 - 126 U/L - - -  AST 15 - 41 U/L - - -  ALT 0 - 44 U/L - - -    Renal function CrCl cannot be calculated (Patient's most recent lab result is older than the maximum 21 days allowed.).  Hgb A1c MFr Bld (%)  Date Value  10/30/2020 5.7 (H)    LDL Cholesterol  Date Value Ref Range Status  08/07/2020 43 0 - 99 mg/dL Final    Comment:           Total Cholesterol/HDL:CHD Risk Coronary Heart Disease Risk Table                     Men   Women  1/2 Average Risk   3.4   3.3  Average Risk       5.0   4.4  2 X Average Risk   9.6   7.1  3 X Average Risk  23.4   11.0        Use the calculated Patient Ratio above and the CHD Risk Table to determine the patient's CHD Risk.        ATP III CLASSIFICATION (LDL):  <100     mg/dL   Optimal  100-129  mg/dL   Near or Above                    Optimal  130-159  mg/dL   Borderline  160-189  mg/dL   High  >190     mg/dL   Very High Performed at [facility] [address], [place], [place] [address]     [name], MD [facility] Phone Number: [phone] 04/13/2021 11:33 AM

## 2021-04-13 ENCOUNTER — Encounter: Payer: Self-pay | Admitting: Physical Therapy

## 2021-04-13 ENCOUNTER — Ambulatory Visit (INDEPENDENT_AMBULATORY_CARE_PROVIDER_SITE_OTHER): Payer: Medicare Other | Admitting: Vascular Surgery

## 2021-04-13 ENCOUNTER — Other Ambulatory Visit: Payer: Self-pay

## 2021-04-13 ENCOUNTER — Encounter: Payer: Self-pay | Admitting: Vascular Surgery

## 2021-04-13 ENCOUNTER — Ambulatory Visit (INDEPENDENT_AMBULATORY_CARE_PROVIDER_SITE_OTHER): Payer: Medicare Other | Admitting: Physical Therapy

## 2021-04-13 VITALS — BP 121/71 | HR 84 | Temp 97.7°F | Resp 20 | Ht 71.0 in | Wt 160.0 lb

## 2021-04-13 DIAGNOSIS — R2681 Unsteadiness on feet: Secondary | ICD-10-CM | POA: Diagnosis not present

## 2021-04-13 DIAGNOSIS — R2689 Other abnormalities of gait and mobility: Secondary | ICD-10-CM

## 2021-04-13 DIAGNOSIS — R293 Abnormal posture: Secondary | ICD-10-CM

## 2021-04-13 DIAGNOSIS — M6281 Muscle weakness (generalized): Secondary | ICD-10-CM

## 2021-04-13 DIAGNOSIS — Z89511 Acquired absence of right leg below knee: Secondary | ICD-10-CM

## 2021-04-13 NOTE — Therapy (Signed)
[facility] Physical Therapy [address] [place], [place], [address] Phone: [phone]   Fax:  [phone]  Physical Therapy Treatment  Patient Details  Name: Jonathan FAUCETT Sr. MRN: [id number] Date of Birth: 06-11-1951 Referring Provider (PT): Jamelle Haring, MD   Encounter Date: 04/13/2021   PT End of Session - 04/13/21 1308     Visit Number 6    Number of Visits 25    Date for PT Re-Evaluation 06/10/21    Authorization Type BCBS Medicare    Authorization Time Period OOP & Ded met, $0 co-pay    Progress Note Due on Visit 10    PT Start Time 1306    PT Stop Time 1346    PT Time Calculation (min) 40 min    Equipment Utilized During Treatment Gait belt    Activity Tolerance Patient tolerated treatment well;Other (comment)   limited by cognitive deficits / memory   Behavior During Therapy WFL for tasks assessed/performed             Past Medical History:  Diagnosis Date   CAD (coronary artery disease)    CHF (congestive heart failure) ([name])    Diabetes mellitus without complication ([place])    type 2   DM (diabetes mellitus) ([name])    ESRD (end stage renal disease) ([name])    FUO (fever of unknown origin) 09/17/2019   Hyperlipidemia    Hypertension    Osteomyelitis ([place])    Peripheral arterial disease ([name])    PVD (peripheral vascular disease) ([place])    Renal disorder 2015   right kidney transplant   Stroke [facility])    Vascular dementia [facility])     Past Surgical History:  Procedure Laterality Date   ABDOMINAL AORTOGRAM N/A 08/05/2020   Procedure: ABDOMINAL AORTOGRAM;  Surgeon: [name], MD;  Location: [place] CV [facility];  Service: Cardiovascular;  Laterality: N/A;   ABDOMINAL AORTOGRAM W/LOWER EXTREMITY N/A 09/09/2020   Procedure: ABDOMINAL AORTOGRAM W/LOWER EXTREMITY;  Surgeon: [name], MD;  Location: [place] [facility];  Service: Cardiovascular;  Laterality: N/A;   AMPUTATION Right 08/06/2020   Procedure: Right Fifth Toe Ray  Amputation;  Surgeon: [name], MD;  Location: [place];  Service: Vascular;  Laterality: Right;   AMPUTATION Right 11/02/2020   Procedure: RIGHT AMPUTATION BELOW KNEE;  Surgeon: [name], MD;  Location: [place];  Service: Vascular;  Laterality: Right;   BACK SURGERY     Has had 2 back surgeries   BUBBLE STUDY  09/18/2019   Procedure: BUBBLE STUDY;  Surgeon: Buford Dresser, MD;  Location: [place];  Service: Cardiovascular;;   Depression     ENDARTERECTOMY FEMORAL Right 08/06/2020   Procedure: Right Ilio- Femoral Artery Endarterectomy;  Surgeon: [name], MD;  Location: [facility] OR;  Service: Vascular;  Laterality: Right;   HAND SURGERY Right    HAND SURGERY     IR REMOVAL TUN CV CATH W/O [facility]  09/03/2019   KIDNEY TRANSPLANT     November 2015   LEG AMPUTATION BELOW KNEE Right    LOWER EXTREMITY ANGIOGRAPHY Bilateral 08/05/2020   Procedure: LOWER EXTREMITY ANGIOGRAPHY;  Surgeon: [name], MD;  Location: [place] [facility];  Service: Cardiovascular;  Laterality: Bilateral;   Memory loss     NEPHRECTOMY TRANSPLANTED ORGAN     PATCH ANGIOPLASTY Right 08/06/2020   Procedure: PATCH ANGIOPLASTY;  Surgeon: [name], MD;  Location: [place];  Service: Vascular;  Laterality: Right;   PERIPHERAL  VASCULAR BALLOON ANGIOPLASTY Right 09/09/2020   Procedure: PERIPHERAL VASCULAR BALLOON ANGIOPLASTY;  Surgeon: [name], MD;  Location: [place] [facility];  Service: Cardiovascular;  Laterality: Right;  popliteal   PERIPHERAL VASCULAR INTERVENTION Right 08/05/2020   Procedure: PERIPHERAL VASCULAR INTERVENTION;  Surgeon: [name], MD;  Location: [place] [facility];  Service: Cardiovascular;  Laterality: Right;  SFA   PERIPHERAL VASCULAR INTERVENTION Right 09/09/2020   Procedure: PERIPHERAL VASCULAR INTERVENTION;  Surgeon: [name], MD;  Location: [place] [facility];  Service: Cardiovascular;  Laterality: Right;  external iliac   TEE WITHOUT  CARDIOVERSION N/A 09/18/2019   Procedure: TRANSESOPHAGEAL ECHOCARDIOGRAM (TEE);  Surgeon: Buford Dresser, MD;  Location: [facility] ENDOSCOPY;  Service: Cardiovascular;  Laterality: N/A;   WOUND DEBRIDEMENT Right 10/08/2020   Procedure: DEBRIDEMENT WOUND RIGHT FOOT;  Surgeon: [name], MD;  Location: [place];  Service: Vascular;  Laterality: Right;    There were no vitals filed for this visit.   Subjective Assessment - 04/13/21 1306     Subjective He is wearing prosthesis on nondialysis days every day for 4-5 hours without issues.    Patient is accompained by: Family member   wife   Pertinent History rt TTA, DM2, HTN, PVD, ESRD w/dialysis, hx right kidney transplant Nov 2015, Stroke, back sg X 2, memory issues    Limitations Lifting;Walking;House hold activities    Patient Stated Goals use prosthesis to get around house & community, be able to get in & out of high truck. be able to go to bathroom without assistance.    Currently in Pain? No/denies                               [facility] Adult PT Treatment/Exercise - 04/13/21 1306       Transfers   Transfers Sit to Stand;Stand to [employer];Lateral/Scoot Transfers    Sit to Stand 4: Min guard;With upper extremity assist;From chair/3-in-1   chairs with & without armrests   Sit to Stand Details Verbal cues for technique    Stand to Sit 4: Min guard;With upper extremity assist;To chair/3-in-1   chairs with & without armrests   Stand to Sit Details (indicate cue type and reason) Verbal cues for technique    Lateral/Scoot Transfers 5: Supervision      Ambulation/Gait   Ambulation/Gait Yes    Ambulation/Gait Assistance 4: Min assist    Ambulation/Gait Assistance Details visual & verbal cues for step width, step length& upright posture    Ambulation Distance (Feet) 90 Feet   90' 50' & 25'   Assistive device Rolling walker;Prosthesis    Ramp 4: Min assist   RW & TTA prosthesis   Ramp Details (indicate  cue type and reason) verbal & tactile cues on technique    Curb 4: Min assist   RW & TTA prosthesis   Curb Details (indicate cue type and reason) verbal & tactile cues on technique      Neuro Re-ed    Neuro Re-ed Details  --      Exercises   Exercises Knee/Hip      Knee/Hip Exercises: Aerobic   Nustep Seat 11 level 6 with BLEs & BUEs for 8 minutes.      Knee/Hip Exercises: Machines for Strengthening   Total Gym Leg Press shuttle leg press 75# 15 reps 2 sets      Knee/Hip Exercises: Seated   Sit to Sand --  Prosthetics   Prosthetic Care Comments  PT recommended increasing to most of awake hours on nondialysis days & up to 4hrs after nap on dialysis days.    Current prosthetic wear tolerance (days/week)  5 of 7days since last PT session    Current prosthetic wear tolerance (#hours/day)  6 hrs total on nondialysis days    Current prosthetic weight-bearing tolerance (hours/day)  Patient tolerated standing for 5 min with partial weight on prosthesis with no c/o pain or discomfort.    Residual limb condition  no open areas, normal color & temperature, cylinderical shape.    Education Provided Proper wear schedule/adjustment    Person(s) Educated Patient;Spouse    Education Method Explanation;Verbal cues    Education Method Verbalized understanding;Verbal cues required    Donning Prosthesis Moderate assist                      PT Short Term Goals - 04/13/21 1355       PT SHORT TERM GOAL #1   Title Wife donnes prosthesis correctly & verbalizes proper cleaning.    Time 4    Period Weeks    Status Achieved    Target Date 04/15/21      PT SHORT TERM GOAL #2   Title Patient tolerates prosthesis >8 hrs total on nondialysis days & >4 hours if out of bed on dialysis days without skin issues or limb pain    Time 4    Period Weeks    Status Partially Met    Target Date 04/15/21      PT SHORT TERM GOAL #3   Title Patient able to sit to & from stand w/c or chair with  armrests to RW with supervision.    Time 4    Period Weeks    Status Not Met    Target Date 04/15/21      PT SHORT TERM GOAL #4   Title Patient ambulates 100' with RW & prosthesis with minA.    Time 4    Period Weeks    Status Partially Met    Target Date 04/15/21      PT SHORT TERM GOAL #5   Title Patient able to negotiate curbs with RW with modA.    Time 4    Period Weeks    Status Achieved    Target Date 04/15/21              PT Short Term Goals - 04/13/21 1606       PT SHORT TERM GOAL #1   Title Wife verbalizes how to adjust weight at dialysis when wearing prosthesis.    Time 4    Period Weeks    Status New    Target Date 05/13/21      PT SHORT TERM GOAL #2   Title Patient tolerates prosthesis >10 hrs total on nondialysis days & >4 hours if out of bed on dialysis days without skin issues or limb pain    Time 4    Period Weeks    Status Revised    Target Date 05/13/21      PT SHORT TERM GOAL #3   Title Patient able to sit to & from stand w/c or chair without armrests to RW with supervision.    Time 4    Period Weeks    Status Revised    Target Date 05/13/21      PT SHORT TERM GOAL #4   Title Patient ambulates 150'  with RW & prosthesis with supervision.    Time 4    Period Weeks    Status Revised    Target Date 05/13/21      PT SHORT TERM GOAL #5   Title Patient able to negotiate ramps & curbs with RW with minA from wife safely.    Time 4    Period Weeks    Status Revised    Target Date 05/13/21               PT Long Term Goals - 03/11/21 1408       PT LONG TERM GOAL #1   Title Patient's wife demonstrates & verbalized understanding of prosthetic care to enable safe utilization of prosthesis.    Time 12    Period Weeks    Status New    Target Date 06/10/21      PT LONG TERM GOAL #2   Title Patient tolerates prosthesis wear >90% of awake hours without skin or limb pain issues.    Time 12    Period Weeks    Status New    Target Date  06/10/21      PT LONG TERM GOAL #3   Title Patient able to perform standing ADLs like managing pants to toilet with RW & prosthesis with supervision.    Time 12    Period Weeks    Status New    Target Date 06/10/21      PT LONG TERM GOAL #4   Title Patient ambulates 200' with RW or rollator walker & prosthesis with wife's supervision.    Time 12    Period Weeks    Status New    Target Date 06/10/21      PT LONG TERM GOAL #5   Title Patient negotiates ramps & curbs with RW or rollator walker & prosthesis with minA from wife.    Time 12    Period Weeks    Status New    Target Date 06/10/21                   Plan - 04/13/21 1308     Clinical Impression Statement Patient is making slow progress towards mobility with prosthesis. PT recommended walking on nondialysis days with RW with wife inside home to increase activity level. She verbalized understanding.    Personal Factors and Comorbidities Comorbidity 3+;Fitness;Time since onset of injury/illness/exacerbation    Comorbidities rt TTA, DM2, HTN, PVD, ESRD w/dialysis, hx right kidney transplant Nov 2015, Stroke, back sg X 2, memory loss    Examination-Activity Limitations Stand;Transfers;Toileting;Stairs;Locomotion Level    Examination-Participation Restrictions Community Activity;Other   household mobility & ADLs   Stability/Clinical Decision Making Evolving/Moderate complexity    Rehab Potential Good    PT Frequency 2x / week    PT Duration 12 weeks    PT Treatment/Interventions ADLs/Self Care Home Management;DME Instruction;Gait training;Stair training;Functional mobility training;Therapeutic activities;Therapeutic exercise;Balance training;Neuromuscular re-education;Patient/family education;Prosthetic Training;Vestibular    PT Next Visit Plan work towards updated STGs, continue to work to prosthetic gait with RW & standing balance activities, Nustep & leg press, improving wear time    Consulted and Agree with Plan of  Care Patient    Family Member Consulted wife, Mannix Kroeker             Patient will benefit from skilled therapeutic intervention in order to improve the following deficits and impairments:  Abnormal gait, Decreased activity tolerance, Decreased balance, Decreased endurance, Decreased knowledge of use of DME,  Decreased mobility, Decreased strength, Increased edema, Impaired flexibility, Postural dysfunction, Prosthetic Dependency  Visit Diagnosis: Other abnormalities of gait and mobility  Unsteadiness on feet  Abnormal posture  Muscle weakness (generalized)     Problem List Patient Active Problem List   Diagnosis Date Noted   Osteomyelitis ([place]) 10/30/2020   Encounter for general adult medical examination without abnormal findings 09/15/2020   Hemiplegia of dominant side ([place]) 09/15/2020   History of cerebrovascular accident 09/15/2020   History of COVID-19 09/15/2020   Hypercoagulable state ([place]) 09/15/2020   Other chronic osteomyelitis, right ankle and foot ([place]) 09/15/2020   Right flank pain 09/15/2020   Atherosclerosis of native arteries of the extremities with ulceration ([place]) 08/05/2020   Encounter for removal of sutures 07/27/2020   Neuropathic ulcer of foot, unspecified laterality, limited to breakdown of skin ([place]) 06/09/2020   Anemia 01/16/2020   Headache, unspecified 12/03/2019   Other disorders of phosphorus metabolism 10/25/2019   Unspecified protein-calorie malnutrition ([place]) 09/27/2019   FUO (fever of unknown origin) 09/17/2019   Cerebral thrombosis with cerebral infarction 08/02/2019   Cerebral embolism with cerebral infarction 08/02/2019   Acute respiratory failure due to COVID-19 [facility]) 07/30/2019   Acute respiratory failure with hypoxia ([place]) 07/30/2019   Allergy, unspecified, initial encounter 16/05/9603   Chronic systolic HF (heart failure) ([place]) 02/25/2019   ESRD (end stage renal disease) on dialysis ([place]) 02/25/2019   Hypertensive heart and  chronic kidney disease with heart failure and with stage 5 chronic kidney disease, or end stage renal disease ([place]) 12/04/2018   Hypoglycemia, unspecified 08/17/2018   Generalized abdominal pain 07/16/2018   Hypercalcemia 06/28/2018   Arteriovenous fistula for hemodialysis in place, primary ([place]) 05/15/2018   Failed kidney transplant 05/15/2018   Anemia in chronic kidney disease 05/13/2018   Idiopathic gout, unspecified site 54/04/8118   Complication of vascular dialysis catheter 01/16/2018   Other specified coagulation defects ([place]) 01/16/2018   Pain, unspecified 01/16/2018   Pruritus, unspecified 01/16/2018   Secondary hyperparathyroidism of renal origin ([place]) 01/16/2018   Shortness of breath 01/16/2018   Type 2 diabetes mellitus with diabetic peripheral angiopathy without gangrene ([place]) 01/16/2018   Abnormal gait 01/10/2018   Dependence on renal dialysis ([place]) 01/10/2018   Iron deficiency anemia, unspecified 12/21/2017   Acute blood loss anemia 12/14/2017   Hypomagnesemia 12/03/2017   Anticoagulated 12/02/2017   Stroke ([place]) 11/29/2017   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 11/28/2017   History of MI (myocardial infarction) 11/28/2017   Hypertension 11/28/2017   Chronic hepatitis C without hepatic coma ([place]) 09/25/2017   Diabetes mellitus type 2, uncomplicated ([name]) 14/78/2956   Myocardial infarction ([place]) 09/25/2017   Renal transplant recipient 09/25/2017   Transfusion history 09/25/2017   Vascular dementia without behavioral disturbance ([place]) 03/10/2017   History of coronary artery disease 09/12/2016   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 MI ([place]) 09/12/2016   Acute coronary thrombosis not resulting in myocardial infarction ([place]) 09/12/2016   Aftercare following organ transplant 09/11/2016   Mineral metabolism disorder 09/11/2016   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 [place], [place], [address] Phone: [phone]   Fax:  [phone]  Name:  Jonathan [name] MRN: [id number] Date of Birth: [birth year]

## 2021-04-20 ENCOUNTER — Encounter: Payer: Self-pay | Admitting: Rehabilitation

## 2021-04-20 ENCOUNTER — Ambulatory Visit (INDEPENDENT_AMBULATORY_CARE_PROVIDER_SITE_OTHER): Payer: Medicare Other | Admitting: Rehabilitation

## 2021-04-20 ENCOUNTER — Other Ambulatory Visit: Payer: Self-pay

## 2021-04-20 DIAGNOSIS — R2689 Other abnormalities of gait and mobility: Secondary | ICD-10-CM | POA: Diagnosis not present

## 2021-04-20 DIAGNOSIS — R293 Abnormal posture: Secondary | ICD-10-CM | POA: Diagnosis not present

## 2021-04-20 DIAGNOSIS — M6281 Muscle weakness (generalized): Secondary | ICD-10-CM | POA: Diagnosis not present

## 2021-04-20 DIAGNOSIS — R2681 Unsteadiness on feet: Secondary | ICD-10-CM

## 2021-04-20 NOTE — Therapy (Signed)
[facility] Physical Therapy [address] [place], [place], [address] Phone: [phone]   Fax:  [phone]  Physical Therapy Treatment  Patient Details  Name: Jonathan FITZ Sr. MRN: [id number] Date of Birth: 1950/08/18 Referring Provider (PT): Jamelle Haring, MD   Encounter Date: 04/20/2021   PT End of Session - 04/20/21 1449     Visit Number 7    Number of Visits 25    Date for PT Re-Evaluation 06/10/21    Authorization Type BCBS Medicare    Authorization Time Period OOP & Ded met, $0 co-pay    Progress Note Due on Visit 10    PT Start Time 1400   pt late to session   PT Stop Time 1433    PT Time Calculation (min) 33 min    Equipment Utilized During Treatment Gait belt    Activity Tolerance Patient tolerated treatment well;Other (comment)   limited by cognitive deficits / memory   Behavior During Therapy WFL for tasks assessed/performed             Past Medical History:  Diagnosis Date   CAD (coronary artery disease)    CHF (congestive heart failure) ([name])    Diabetes mellitus without complication ([place])    type 2   DM (diabetes mellitus) ([name])    ESRD (end stage renal disease) ([name])    FUO (fever of unknown origin) 09/17/2019   Hyperlipidemia    Hypertension    Osteomyelitis ([place])    Peripheral arterial disease ([name])    PVD (peripheral vascular disease) ([place])    Renal disorder 2015   right kidney transplant   Stroke [facility])    Vascular dementia [facility])     Past Surgical History:  Procedure Laterality Date   ABDOMINAL AORTOGRAM N/A 08/05/2020   Procedure: ABDOMINAL AORTOGRAM;  Surgeon: [name], MD;  Location: [place] CV LAB;  Service: Cardiovascular;  Laterality: N/A;   ABDOMINAL AORTOGRAM W/LOWER EXTREMITY N/A 09/09/2020   Procedure: ABDOMINAL AORTOGRAM W/LOWER EXTREMITY;  Surgeon: [name], MD;  Location: [place] [facility];  Service: Cardiovascular;  Laterality: N/A;   AMPUTATION Right 08/06/2020   Procedure: Right  Fifth Toe Ray Amputation;  Surgeon: [name], MD;  Location: [place];  Service: Vascular;  Laterality: Right;   AMPUTATION Right 11/02/2020   Procedure: RIGHT AMPUTATION BELOW KNEE;  Surgeon: [name], MD;  Location: [place];  Service: Vascular;  Laterality: Right;   BACK SURGERY     Has had 2 back surgeries   BUBBLE STUDY  09/18/2019   Procedure: BUBBLE STUDY;  Surgeon: Buford Dresser, MD;  Location: [place];  Service: Cardiovascular;;   Depression     ENDARTERECTOMY FEMORAL Right 08/06/2020   Procedure: Right Ilio- Femoral Artery Endarterectomy;  Surgeon: [name], MD;  Location: [facility] OR;  Service: Vascular;  Laterality: Right;   HAND SURGERY Right    HAND SURGERY     IR REMOVAL TUN CV CATH W/O [facility]  09/03/2019   KIDNEY TRANSPLANT     November 2015   LEG AMPUTATION BELOW KNEE Right    LOWER EXTREMITY ANGIOGRAPHY Bilateral 08/05/2020   Procedure: LOWER EXTREMITY ANGIOGRAPHY;  Surgeon: [name], MD;  Location: [place] [facility];  Service: Cardiovascular;  Laterality: Bilateral;   Memory loss     NEPHRECTOMY TRANSPLANTED ORGAN     PATCH ANGIOPLASTY Right 08/06/2020   Procedure: PATCH ANGIOPLASTY;  Surgeon: [name], MD;  Location: [place];  Service: Vascular;  Laterality: Right;   PERIPHERAL VASCULAR BALLOON ANGIOPLASTY Right 09/09/2020   Procedure: PERIPHERAL VASCULAR BALLOON ANGIOPLASTY;  Surgeon: [name], MD;  Location: [place] [facility];  Service: Cardiovascular;  Laterality: Right;  popliteal   PERIPHERAL VASCULAR INTERVENTION Right 08/05/2020   Procedure: PERIPHERAL VASCULAR INTERVENTION;  Surgeon: [name], MD;  Location: [place] [facility];  Service: Cardiovascular;  Laterality: Right;  SFA   PERIPHERAL VASCULAR INTERVENTION Right 09/09/2020   Procedure: PERIPHERAL VASCULAR INTERVENTION;  Surgeon: [name], MD;  Location: [place] [facility];  Service: Cardiovascular;  Laterality: Right;  external iliac   TEE  WITHOUT CARDIOVERSION N/A 09/18/2019   Procedure: TRANSESOPHAGEAL ECHOCARDIOGRAM (TEE);  Surgeon: Buford Dresser, MD;  Location: [facility] ENDOSCOPY;  Service: Cardiovascular;  Laterality: N/A;   WOUND DEBRIDEMENT Right 10/08/2020   Procedure: DEBRIDEMENT WOUND RIGHT FOOT;  Surgeon: [name], MD;  Location: [place];  Service: Vascular;  Laterality: Right;    There were no vitals filed for this visit.   Subjective Assessment - 04/20/21 1448     Subjective Continues to wear prosthesis 5-6 hours on non dialysis days, wife reports no changes, no skin issues.    Patient is accompained by: Family member    Pertinent History rt TTA, DM2, HTN, PVD, ESRD w/dialysis, hx right kidney transplant Nov 2015, Stroke, back sg X 2, memory issues    Patient Stated Goals use prosthesis to get around house & community, be able to get in & out of high truck. be able to go to bathroom without assistance.    Currently in Pain? No/denies                               [facility] Adult PT Treatment/Exercise - 04/20/21 1408       Transfers   Transfers Sit to Stand;Stand to Sit    Sit to Stand 4: Min guard;5: Supervision;4: Min assist    Sit to Stand Details Verbal cues for technique    Sit to Stand Details (indicate cue type and reason) Fluctuating levels of assist from clinic w/c, nustep and arm chair.  Tactile and verbal cues for hand placement and forward weight shift.    Stand to Sit 4: Min guard;With upper extremity assist;To chair/3-in-1    Stand to Sit Details (indicate cue type and reason) Verbal cues for technique      Ambulation/Gait   Ambulation/Gait Yes    Ambulation/Gait Assistance 4: Min assist;4: Min guard    Ambulation/Gait Assistance Details Continued to provide cues for upright posture and forward gaze throughout with assist for guiding walker at times and also cues/assist for continuous walker movement to allow for increase B step length.  Note intermittent L knee genu  recurvatum and "unlocking" at times due to poor quad strength.    Ambulation Distance (Feet) 50 Feet   x 2 reps   Assistive device Rolling walker;Prosthesis    Gait Pattern Step-to pattern;Decreased step length - left;Decreased stance time - right;Decreased stride length;Decreased hip/knee flexion - right;Decreased weight shift to right;Right flexed knee in stance;Left flexed knee in stance;Antalgic;Lateral hip instability;Trunk flexed;Abducted- right    Ambulation Surface Level;Indoor      High Level Balance   High Level Balance Activities Side stepping    High Level Balance Comments Performed side stepping in // bars with BUE support for balance and hip strengthening along with increased prosthetic WB.  Pt needs max cues to contiue  with task (wife did assist with enouragement throughout).  Performed x 1 lap down and back.      Self-Care   Self-Care Other Self-Care Comments    Other Self-Care Comments  Discussed household mobility with wife as she reports she has began to ambulate with him on level surfaces (hardwood) at home.  They have not tried getting into/out of bathroom yet.  PT educated that he will need elevated toilet seat with handles and that we can try next session making tight turns to simulate turning to sit on toilet.  Wife verbalized understanding.      Neuro Re-ed    Neuro Re-ed Details  Performed stepping over small barrier (yard stick) in // bars with BUE support x 15 reps for hip strength and improved WB through prosthesis.  Pt needs max cues for posture (again wife assisting with encouragement).  Attempted to reduce UE support, however pt unable to perform successfully.      Exercises   Exercises Knee/Hip      Knee/Hip Exercises: Aerobic   Nustep Seat 11 level 6 with BLEs & BUEs for 4 mins (did not do longer due to time constraint).      Prosthetics   Prosthetic Care Comments  Wife reports he is still wearing approx 6 hours on non dialysis days,  she will try some days  to have him at least don prosthesis on dialysis days.    Current prosthetic wear tolerance (days/week)  5 of 7days since last PT session    Current prosthetic wear tolerance (#hours/day)  6 hrs total on nondialysis days    Current prosthetic weight-bearing tolerance (hours/day)  Patient tolerated standing for 5 min with partial weight on prosthesis with no c/o pain or discomfort.    Residual limb condition  wife reports no skin issues    Education Provided Proper wear schedule/adjustment    Person(s) Educated Patient;Spouse    Education Method Explanation;Verbal cues    Education Method Verbalized understanding;Verbal cues required                       PT Short Term Goals - 04/13/21 1606       PT SHORT TERM GOAL #1   Title Wife verbalizes how to adjust weight at dialysis when wearing prosthesis.    Time 4    Period Weeks    Status New    Target Date 05/13/21      PT SHORT TERM GOAL #2   Title Patient tolerates prosthesis >10 hrs total on nondialysis days & >4 hours if out of bed on dialysis days without skin issues or limb pain    Time 4    Period Weeks    Status Revised    Target Date 05/13/21      PT SHORT TERM GOAL #3   Title Patient able to sit to & from stand w/c or chair without armrests to RW with supervision.    Time 4    Period Weeks    Status Revised    Target Date 05/13/21      PT SHORT TERM GOAL #4   Title Patient ambulates 150' with RW & prosthesis with supervision.    Time 4    Period Weeks    Status Revised    Target Date 05/13/21      PT SHORT TERM GOAL #5   Title Patient able to negotiate ramps & curbs with RW with minA from wife safely.  Time 4    Period Weeks    Status Revised    Target Date 05/13/21               PT Long Term Goals - 03/11/21 1408       PT LONG TERM GOAL #1   Title Patient's wife demonstrates & verbalized understanding of prosthetic care to enable safe utilization of prosthesis.    Time 12    Period  Weeks    Status New    Target Date 06/10/21      PT LONG TERM GOAL #2   Title Patient tolerates prosthesis wear >90% of awake hours without skin or limb pain issues.    Time 12    Period Weeks    Status New    Target Date 06/10/21      PT LONG TERM GOAL #3   Title Patient able to perform standing ADLs like managing pants to toilet with RW & prosthesis with supervision.    Time 12    Period Weeks    Status New    Target Date 06/10/21      PT LONG TERM GOAL #4   Title Patient ambulates 200' with RW or rollator walker & prosthesis with wife's supervision.    Time 12    Period Weeks    Status New    Target Date 06/10/21      PT LONG TERM GOAL #5   Title Patient negotiates ramps & curbs with RW or rollator walker & prosthesis with minA from wife.    Time 12    Period Weeks    Status New    Target Date 06/10/21                   Plan - 04/20/21 1450     Clinical Impression Statement Session limited due to being 15 mins late.  Pt continues to make slow progress due to poor cognition and motivation.  Continued to encourage them ambulate around home for increased mobility and endurance and that PT would like to practice making tight turns next session to simulate getting into/out of restroom.    Personal Factors and Comorbidities Comorbidity 3+;Fitness;Time since onset of injury/illness/exacerbation    Comorbidities rt TTA, DM2, HTN, PVD, ESRD w/dialysis, hx right kidney transplant Nov 2015, Stroke, back sg X 2, memory loss    Examination-Activity Limitations Stand;Transfers;Toileting;Stairs;Locomotion Level    Examination-Participation Restrictions Community Activity;Other   household mobility & ADLs   Stability/Clinical Decision Making Evolving/Moderate complexity    Rehab Potential Good    PT Frequency 2x / week    PT Duration 12 weeks    PT Treatment/Interventions ADLs/Self Care Home Management;DME Instruction;Gait training;Stair training;Functional mobility  training;Therapeutic activities;Therapeutic exercise;Balance training;Neuromuscular re-education;Patient/family education;Prosthetic Training;Vestibular    PT Next Visit Plan work on tight turns, work towards updated STGs, continue to work to prosthetic gait with RW & standing balance activities, Nustep & leg press, improving wear time    Consulted and Agree with Plan of Care Patient    Family Member Consulted wife, Yamir Carignan             Patient will benefit from skilled therapeutic intervention in order to improve the following deficits and impairments:  Abnormal gait, Decreased activity tolerance, Decreased balance, Decreased endurance, Decreased knowledge of use of DME, Decreased mobility, Decreased strength, Increased edema, Impaired flexibility, Postural dysfunction, Prosthetic Dependency  Visit Diagnosis: Other abnormalities of gait and mobility  Unsteadiness on feet  Abnormal posture  Muscle weakness (generalized)     Problem List Patient Active Problem List   Diagnosis Date Noted   Osteomyelitis ([place]) 10/30/2020   Encounter for general adult medical examination without abnormal findings 09/15/2020   Hemiplegia of dominant side ([place]) 09/15/2020   History of cerebrovascular accident 09/15/2020   History of COVID-19 09/15/2020   Hypercoagulable state ([place]) 09/15/2020   Other chronic osteomyelitis, right ankle and foot ([place]) 09/15/2020   Right flank pain 09/15/2020   Atherosclerosis of native arteries of the extremities with ulceration ([place]) 08/05/2020   Encounter for removal of sutures 07/27/2020   Neuropathic ulcer of foot, unspecified laterality, limited to breakdown of skin ([place]) 06/09/2020   Anemia 01/16/2020   Headache, unspecified 12/03/2019   Other disorders of phosphorus metabolism 10/25/2019   Unspecified protein-calorie malnutrition ([place]) 09/27/2019   FUO (fever of unknown origin) 09/17/2019   Cerebral thrombosis with cerebral infarction 08/02/2019    Cerebral embolism with cerebral infarction 08/02/2019   Acute respiratory failure due to COVID-19 [facility]) 07/30/2019   Acute respiratory failure with hypoxia ([place]) 07/30/2019   Allergy, unspecified, initial encounter 20/35/5974   Chronic systolic HF (heart failure) ([place]) 02/25/2019   ESRD (end stage renal disease) on dialysis ([place]) 02/25/2019   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 kidney disease, or end stage renal disease ([place]) 12/04/2018   Hypoglycemia, unspecified 08/17/2018   Generalized abdominal pain 07/16/2018   Hypercalcemia 06/28/2018   Arteriovenous fistula for hemodialysis in place, primary ([place]) 05/15/2018   Failed kidney transplant 05/15/2018   Anemia in chronic kidney disease 05/13/2018   Idiopathic gout, unspecified site 16/38/4536   Complication of vascular dialysis catheter 01/16/2018   Other specified coagulation defects ([place]) 01/16/2018   Pain, unspecified 01/16/2018   Pruritus, unspecified 01/16/2018   Secondary hyperparathyroidism of renal origin ([place]) 01/16/2018   Shortness of breath 01/16/2018   Type 2 diabetes mellitus with diabetic peripheral angiopathy without gangrene ([place]) 01/16/2018   Abnormal gait 01/10/2018   Dependence on renal dialysis ([place]) 01/10/2018   Iron deficiency anemia, unspecified 12/21/2017   Acute blood loss anemia 12/14/2017   Hypomagnesemia 12/03/2017   Anticoagulated 12/02/2017   Stroke ([place]) 11/29/2017   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 11/28/2017   History of MI (myocardial infarction) 11/28/2017   Hypertension 11/28/2017   Chronic hepatitis C without hepatic coma ([place]) 09/25/2017   Diabetes mellitus type 2, uncomplicated ([name]) 46/80/3212   Myocardial infarction ([place]) 09/25/2017   Renal transplant recipient 09/25/2017   Transfusion history 09/25/2017   Vascular dementia without behavioral disturbance ([place]) 03/10/2017   History of coronary artery disease  09/12/2016   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 MI ([place]) 09/12/2016   Acute coronary thrombosis not resulting in myocardial infarction ([place]) 09/12/2016   Aftercare following organ transplant 09/11/2016   Mineral metabolism disorder 09/11/2016   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 [place], [place], [address] Phone: [phone]   Fax:  [phone] 04/20/21, 2:55 PM   [facility] Physical Therapy [address] [place], [place], [address] Phone: [phone]   Fax:  [phone]  Name: Jonathan WANLESS Sr. MRN: [id number] Date of  Birth: Sep 27, 1950

## 2021-04-22 ENCOUNTER — Encounter: Payer: Self-pay | Admitting: Rehabilitation

## 2021-04-22 ENCOUNTER — Other Ambulatory Visit: Payer: Self-pay

## 2021-04-22 ENCOUNTER — Ambulatory Visit (INDEPENDENT_AMBULATORY_CARE_PROVIDER_SITE_OTHER): Payer: Medicare Other | Admitting: Rehabilitation

## 2021-04-22 ENCOUNTER — Encounter: Payer: Medicare Other | Admitting: Physical Therapy

## 2021-04-22 DIAGNOSIS — R293 Abnormal posture: Secondary | ICD-10-CM

## 2021-04-22 DIAGNOSIS — R2689 Other abnormalities of gait and mobility: Secondary | ICD-10-CM

## 2021-04-22 DIAGNOSIS — M6281 Muscle weakness (generalized): Secondary | ICD-10-CM

## 2021-04-22 DIAGNOSIS — R2681 Unsteadiness on feet: Secondary | ICD-10-CM | POA: Diagnosis not present

## 2021-04-22 NOTE — Therapy (Signed)
[facility] Physical Therapy [address] [place], [place], [address] Phone: [phone]   Fax:  [phone]  Physical Therapy Treatment  Patient Details  Name: Jonathan [name] Sr. MRN: [id number] Date of Birth: August 05, 1951 Referring Provider (PT): Jamelle Haring, MD   Encounter Date: 04/22/2021   PT End of Session - 04/22/21 1358     Visit Number 8    Number of Visits 25    Date for PT Re-Evaluation 06/10/21    Authorization Type BCBS Medicare    Authorization Time Period OOP & Ded met, $0 co-pay    Progress Note Due on Visit 10    PT Start Time 1107    PT Stop Time 1145    PT Time Calculation (min) 38 min    Equipment Utilized During Treatment Gait belt    Activity Tolerance Patient tolerated treatment well;Other (comment)   limited by cognitive deficits / memory   Behavior During Therapy WFL for tasks assessed/performed             Past Medical History:  Diagnosis Date   CAD (coronary artery disease)    CHF (congestive heart failure) ([name])    Diabetes mellitus without complication ([place])    type 2   DM (diabetes mellitus) ([name])    ESRD (end stage renal disease) ([name])    FUO (fever of unknown origin) 09/17/2019   Hyperlipidemia    Hypertension    Osteomyelitis ([place])    Peripheral arterial disease ([name])    PVD (peripheral vascular disease) ([place])    Renal disorder 2015   right kidney transplant   Stroke [facility])    Vascular dementia [facility])     Past Surgical History:  Procedure Laterality Date   ABDOMINAL AORTOGRAM N/A 08/05/2020   Procedure: ABDOMINAL AORTOGRAM;  Surgeon: [name], MD;  Location: [place] [facility];  Service: Cardiovascular;  Laterality: N/A;   ABDOMINAL AORTOGRAM W/LOWER EXTREMITY N/A 09/09/2020   Procedure: ABDOMINAL AORTOGRAM W/LOWER EXTREMITY;  Surgeon: [name], MD;  Location: [place] [facility];  Service: Cardiovascular;  Laterality: N/A;   AMPUTATION Right 08/06/2020   Procedure: Right Fifth Toe Ray  Amputation;  Surgeon: [name], MD;  Location: [place];  Service: Vascular;  Laterality: Right;   AMPUTATION Right 11/02/2020   Procedure: RIGHT AMPUTATION BELOW KNEE;  Surgeon: [name], MD;  Location: [place];  Service: Vascular;  Laterality: Right;   BACK SURGERY     Has had 2 back surgeries   BUBBLE STUDY  09/18/2019   Procedure: BUBBLE STUDY;  Surgeon: Buford Dresser, MD;  Location: [place];  Service: Cardiovascular;;   Depression     ENDARTERECTOMY FEMORAL Right 08/06/2020   Procedure: Right Ilio- Femoral Artery Endarterectomy;  Surgeon: [name], MD;  Location: [facility] OR;  Service: Vascular;  Laterality: Right;   HAND SURGERY Right    HAND SURGERY     IR REMOVAL TUN CV CATH W/O [facility]  09/03/2019   KIDNEY TRANSPLANT     November 2015   LEG AMPUTATION BELOW KNEE Right    LOWER EXTREMITY ANGIOGRAPHY Bilateral 08/05/2020   Procedure: LOWER EXTREMITY ANGIOGRAPHY;  Surgeon: [name], MD;  Location: [place] [facility];  Service: Cardiovascular;  Laterality: Bilateral;   Memory loss     NEPHRECTOMY TRANSPLANTED ORGAN     PATCH ANGIOPLASTY Right 08/06/2020   Procedure: PATCH ANGIOPLASTY;  Surgeon: [name], MD;  Location: [place];  Service: Vascular;  Laterality: Right;   PERIPHERAL  VASCULAR BALLOON ANGIOPLASTY Right 09/09/2020   Procedure: PERIPHERAL VASCULAR BALLOON ANGIOPLASTY;  Surgeon: [name], MD;  Location: [place] [facility];  Service: Cardiovascular;  Laterality: Right;  popliteal   PERIPHERAL VASCULAR INTERVENTION Right 08/05/2020   Procedure: PERIPHERAL VASCULAR INTERVENTION;  Surgeon: [name], MD;  Location: [place] [facility];  Service: Cardiovascular;  Laterality: Right;  SFA   PERIPHERAL VASCULAR INTERVENTION Right 09/09/2020   Procedure: PERIPHERAL VASCULAR INTERVENTION;  Surgeon: [name], MD;  Location: [place] [facility];  Service: Cardiovascular;  Laterality: Right;  external iliac   TEE WITHOUT  CARDIOVERSION N/A 09/18/2019   Procedure: TRANSESOPHAGEAL ECHOCARDIOGRAM (TEE);  Surgeon: Buford Dresser, MD;  Location: [facility] ENDOSCOPY;  Service: Cardiovascular;  Laterality: N/A;   WOUND DEBRIDEMENT Right 10/08/2020   Procedure: DEBRIDEMENT WOUND RIGHT FOOT;  Surgeon: [name], MD;  Location: [place];  Service: Vascular;  Laterality: Right;    There were no vitals filed for this visit.   Subjective Assessment - 04/22/21 1114     Subjective Pt/wife reports he did ambulate in/out of restroom this morning side stepping with RW.  She did not have him sit as she doesn't have the bedside commode set up yet.  She would like to purchase toilet riser.  Advised to check prices of [place], Paediatric nurse, etc.    Patient is accompained by: Family member    Pertinent History rt TTA, DM2, HTN, PVD, ESRD w/dialysis, hx right kidney transplant Nov 2015, Stroke, back sg X 2, memory issues    Limitations Lifting;Walking;House hold activities    Patient Stated Goals use prosthesis to get around house & community, be able to get in & out of high truck. be able to go to bathroom without assistance.    Currently in Pain? No/denies                               [facility] Adult PT Treatment/Exercise - 04/22/21 1119       Transfers   Transfers Sit to Stand;Stand to Sit    Sit to Stand 5: Supervision    Sit to Stand Details Verbal cues for technique    Sit to Stand Details (indicate cue type and reason) Pt did much better with sit<>stand today at S level consistently    Stand to Sit With upper extremity assist;To chair/3-in-1;5: Supervision    Stand to Sit Details (indicate cue type and reason) Verbal cues for technique      Ambulation/Gait   Ambulation/Gait Yes    Ambulation/Gait Assistance 4: Min guard    Ambulation/Gait Assistance Details Pt continues to have very slow gait speed, however did do better today with posture and continued walker movement to allow for larger B step  length.  He does still need cues at times (and tactile cues on RW) for steering and for forward gaze/upright posture to avoid obstacles.  Also worked on negotiation of obstacles during session, weaving around cones and then also working on side stepping with RW between to simulate gait into/out of restroom.  Pt did very well with gait around cones, however needed mod fading to min cues for negotiation sideways with RW.  Wife present to observe pt doing this in session.    Ambulation Distance (Feet) 75 Feet   x 2, 50' x 1   Assistive device Rolling walker;Prosthesis    Gait Pattern Step-to pattern;Decreased step length - left;Decreased stance time - right;Decreased stride  length;Decreased hip/knee flexion - right;Decreased weight shift to right;Right flexed knee in stance;Left flexed knee in stance;Antalgic;Lateral hip instability;Trunk flexed;Abducted- right    Ambulation Surface Level;Indoor      Exercises   Exercises Knee/Hip      Knee/Hip Exercises: Aerobic   Nustep Seat 11 level 6 with BLEs & BUEs for 4 mins (did not do longer due to time constraint).      Prosthetics   Prosthetic Care Comments  Continue to encourage increased wear time on non dialysis days    Current prosthetic wear tolerance (days/week)  5 of 7days since last PT session    Current prosthetic wear tolerance (#hours/day)  6 hrs total on nondialysis days    Current prosthetic weight-bearing tolerance (hours/day)  Patient tolerated standing for 5 min with partial weight on prosthesis with no c/o pain or discomfort.    Residual limb condition  wife reports no skin issues    Education Provided Proper wear schedule/adjustment    Person(s) Educated Patient;Spouse    Education Method Explanation;Verbal cues    Education Method Verbalized understanding;Verbal cues required                       PT Short Term Goals - 04/13/21 1606       PT SHORT TERM GOAL #1   Title Wife verbalizes how to adjust weight at  dialysis when wearing prosthesis.    Time 4    Period Weeks    Status New    Target Date 05/13/21      PT SHORT TERM GOAL #2   Title Patient tolerates prosthesis >10 hrs total on nondialysis days & >4 hours if out of bed on dialysis days without skin issues or limb pain    Time 4    Period Weeks    Status Revised    Target Date 05/13/21      PT SHORT TERM GOAL #3   Title Patient able to sit to & from stand w/c or chair without armrests to RW with supervision.    Time 4    Period Weeks    Status Revised    Target Date 05/13/21      PT SHORT TERM GOAL #4   Title Patient ambulates 150' with RW & prosthesis with supervision.    Time 4    Period Weeks    Status Revised    Target Date 05/13/21      PT SHORT TERM GOAL #5   Title Patient able to negotiate ramps & curbs with RW with minA from wife safely.    Time 4    Period Weeks    Status Revised    Target Date 05/13/21               PT Long Term Goals - 03/11/21 1408       PT LONG TERM GOAL #1   Title Patient's wife demonstrates & verbalized understanding of prosthetic care to enable safe utilization of prosthesis.    Time 12    Period Weeks    Status New    Target Date 06/10/21      PT LONG TERM GOAL #2   Title Patient tolerates prosthesis wear >90% of awake hours without skin or limb pain issues.    Time 12    Period Weeks    Status New    Target Date 06/10/21      PT LONG TERM GOAL #3   Title Patient able to  perform standing ADLs like managing pants to toilet with RW & prosthesis with supervision.    Time 12    Period Weeks    Status New    Target Date 06/10/21      PT LONG TERM GOAL #4   Title Patient ambulates 200' with RW or rollator walker & prosthesis with wife's supervision.    Time 12    Period Weeks    Status New    Target Date 06/10/21      PT LONG TERM GOAL #5   Title Patient negotiates ramps & curbs with RW or rollator walker & prosthesis with minA from wife.    Time 12    Period  Weeks    Status New    Target Date 06/10/21                   Plan - 04/22/21 1359     Clinical Impression Statement Skilled session continues to focus on LE strengthening, gait with RW for both quality and distance along with actiivty tolerance and gait around obstacles and tight spaces to simulate gait at home into/out of restroom.    Personal Factors and Comorbidities Comorbidity 3+;Fitness;Time since onset of injury/illness/exacerbation    Comorbidities rt TTA, DM2, HTN, PVD, ESRD w/dialysis, hx right kidney transplant Nov 2015, Stroke, back sg X 2, memory loss    Examination-Activity Limitations Stand;Transfers;Toileting;Stairs;Locomotion Level    Examination-Participation Restrictions Community Activity;Other   household mobility & ADLs   Stability/Clinical Decision Making Evolving/Moderate complexity    Rehab Potential Good    PT Frequency 2x / week    PT Duration 12 weeks    PT Treatment/Interventions ADLs/Self Care Home Management;DME Instruction;Gait training;Stair training;Functional mobility training;Therapeutic activities;Therapeutic exercise;Balance training;Neuromuscular re-education;Patient/family education;Prosthetic Training;Vestibular    PT Next Visit Plan work on tight turns, work towards updated STGs, continue to work to prosthetic gait with RW & standing balance activities, Nustep & leg press, improving wear time    Consulted and Agree with Plan of Care Patient    Family Member Consulted wife, Jadier Rockers             Patient will benefit from skilled therapeutic intervention in order to improve the following deficits and impairments:  Abnormal gait, Decreased activity tolerance, Decreased balance, Decreased endurance, Decreased knowledge of use of DME, Decreased mobility, Decreased strength, Increased edema, Impaired flexibility, Postural dysfunction, Prosthetic Dependency  Visit Diagnosis: Other abnormalities of gait and mobility  Abnormal  posture  Unsteadiness on feet  Muscle weakness (generalized)     Problem List Patient Active Problem List   Diagnosis Date Noted   Osteomyelitis ([place]) 10/30/2020   Encounter for general adult medical examination without abnormal findings 09/15/2020   Hemiplegia of dominant side ([place]) 09/15/2020   History of cerebrovascular accident 09/15/2020   History of COVID-19 09/15/2020   Hypercoagulable state ([place]) 09/15/2020   Other chronic osteomyelitis, right ankle and foot ([place]) 09/15/2020   Right flank pain 09/15/2020   Atherosclerosis of native arteries of the extremities with ulceration ([place]) 08/05/2020   Encounter for removal of sutures 07/27/2020   Neuropathic ulcer of foot, unspecified laterality, limited to breakdown of skin ([place]) 06/09/2020   Anemia 01/16/2020   Headache, unspecified 12/03/2019   Other disorders of phosphorus metabolism 10/25/2019   Unspecified protein-calorie malnutrition ([place]) 09/27/2019   FUO (fever of unknown origin) 09/17/2019   Cerebral thrombosis with cerebral infarction 08/02/2019   Cerebral embolism with cerebral infarction 08/02/2019   Acute respiratory failure due to  COVID-19 ([place]) 07/30/2019   Acute respiratory failure with hypoxia ([place]) 07/30/2019   Allergy, unspecified, initial encounter 46/21/9471   Chronic systolic HF (heart failure) ([employer]) 02/25/2019   ESRD (end stage renal disease) on dialysis ([place]) 02/25/2019   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 kidney disease, or end stage renal disease ([place]) 12/04/2018   Hypoglycemia, unspecified 08/17/2018   Generalized abdominal pain 07/16/2018   Hypercalcemia 06/28/2018   Arteriovenous fistula for hemodialysis in place, primary ([place]) 05/15/2018   Failed kidney transplant 05/15/2018   Anemia in chronic kidney disease 05/13/2018   Idiopathic gout, unspecified site 25/27/1292   Complication of vascular dialysis catheter 01/16/2018   Other specified  coagulation defects ([place]) 01/16/2018   Pain, unspecified 01/16/2018   Pruritus, unspecified 01/16/2018   Secondary hyperparathyroidism of renal origin ([place]) 01/16/2018   Shortness of breath 01/16/2018   Type 2 diabetes mellitus with diabetic peripheral angiopathy without gangrene ([place]) 01/16/2018   Abnormal gait 01/10/2018   Dependence on renal dialysis ([place]) 01/10/2018   Iron deficiency anemia, unspecified 12/21/2017   Acute blood loss anemia 12/14/2017   Hypomagnesemia 12/03/2017   Anticoagulated 12/02/2017   Stroke ([place]) 11/29/2017   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 11/28/2017   History of MI (myocardial infarction) 11/28/2017   Hypertension 11/28/2017   Chronic hepatitis C without hepatic coma ([place]) 09/25/2017   Diabetes mellitus type 2, uncomplicated ([name]) 90/90/3014   Myocardial infarction ([place]) 09/25/2017   Renal transplant recipient 09/25/2017   Transfusion history 09/25/2017   Vascular dementia without behavioral disturbance ([place]) 03/10/2017   History of coronary artery disease 09/12/2016   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 MI ([place]) 09/12/2016   Acute coronary thrombosis not resulting in myocardial infarction ([place]) 09/12/2016   Aftercare following organ transplant 09/11/2016   Mineral metabolism disorder 09/11/2016   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 [place], [place], [address] Phone: [phone]   Fax:  [phone] 04/22/21, 2:02 PM   [facility] Physical Therapy [address] [place], [place], [address] Phone: [phone]   Fax:   [phone]  Name: SHERRI LEVENHAGEN Sr. MRN: [id number] Date of Birth: 09/19/1950

## 2021-04-27 ENCOUNTER — Other Ambulatory Visit: Payer: Self-pay

## 2021-04-27 ENCOUNTER — Encounter: Payer: Self-pay | Admitting: Physical Therapy

## 2021-04-27 ENCOUNTER — Ambulatory Visit (INDEPENDENT_AMBULATORY_CARE_PROVIDER_SITE_OTHER): Payer: Medicare Other | Admitting: Physical Therapy

## 2021-04-27 DIAGNOSIS — R2681 Unsteadiness on feet: Secondary | ICD-10-CM | POA: Diagnosis not present

## 2021-04-27 DIAGNOSIS — R293 Abnormal posture: Secondary | ICD-10-CM | POA: Diagnosis not present

## 2021-04-27 DIAGNOSIS — R2689 Other abnormalities of gait and mobility: Secondary | ICD-10-CM | POA: Diagnosis not present

## 2021-04-27 DIAGNOSIS — M6281 Muscle weakness (generalized): Secondary | ICD-10-CM

## 2021-04-27 NOTE — Therapy (Signed)
[facility] [address] [place], [place], [address] Phone: [phone]   Fax:  [phone]  Physical Therapy Treatment  Patient Details  Name: Jonathan PAVEK Sr. MRN: [id number] Date of Birth: 1951-04-21 Referring Provider (PT): Jamelle Haring, MD   Encounter Date: 04/27/2021   PT End of Session - 04/27/21 1442     Visit Number 9    Number of Visits 25    Date for PT Re-Evaluation 06/10/21    Authorization Type BCBS Medicare    Authorization Time Period OOP & Ded met, $0 co-pay    Progress Note Due on Visit 10    PT Start Time 1352    PT Stop Time 1433    PT Time Calculation (min) 41 min    Equipment Utilized During Treatment Gait belt    Activity Tolerance Patient tolerated treatment well;Other (comment)   limited by cognitive deficits / memory   Behavior During Therapy WFL for tasks assessed/performed             Past Medical History:  Diagnosis Date   CAD (coronary artery disease)    CHF (congestive heart failure) ([name])    Diabetes mellitus without complication ([place])    type 2   DM (diabetes mellitus) ([name])    ESRD (end stage renal disease) ([name])    FUO (fever of unknown origin) 09/17/2019   Hyperlipidemia    Hypertension    Osteomyelitis ([place])    Peripheral arterial disease ([name])    PVD (peripheral vascular disease) ([place])    Renal disorder 2015   right kidney transplant   Stroke [facility])    Vascular dementia [facility])     Past Surgical History:  Procedure Laterality Date   ABDOMINAL AORTOGRAM N/A 08/05/2020   Procedure: ABDOMINAL AORTOGRAM;  Surgeon: [name], MD;  Location: [place] CV LAB;  Service: Cardiovascular;  Laterality: N/A;   ABDOMINAL AORTOGRAM W/LOWER EXTREMITY N/A 09/09/2020   Procedure: ABDOMINAL AORTOGRAM W/LOWER EXTREMITY;  Surgeon: [name], MD;  Location: [place] CV LAB;  Service: Cardiovascular;  Laterality: N/A;   AMPUTATION Right 08/06/2020   Procedure: Right Fifth Toe Ray  Amputation;  Surgeon: [name], MD;  Location: [place];  Service: Vascular;  Laterality: Right;   AMPUTATION Right 11/02/2020   Procedure: RIGHT AMPUTATION BELOW KNEE;  Surgeon: [name], MD;  Location: [place];  Service: Vascular;  Laterality: Right;   BACK SURGERY     Has had 2 back surgeries   BUBBLE STUDY  09/18/2019   Procedure: BUBBLE STUDY;  Surgeon: Buford Dresser, MD;  Location: [place];  Service: Cardiovascular;;   Depression     ENDARTERECTOMY FEMORAL Right 08/06/2020   Procedure: Right Ilio- Femoral Artery Endarterectomy;  Surgeon: [name], MD;  Location[facility];  Service: Vascular;  Laterality: Right;   HAND SURGERY Right    HAND SURGERY     IR REMOVAL TUN CV CATH W/O [facility]  09/03/2019   KIDNEY TRANSPLANT     November 2015   LEG AMPUTATION BELOW KNEE Right    LOWER EXTREMITY ANGIOGRAPHY Bilateral 08/05/2020   Procedure: LOWER EXTREMITY ANGIOGRAPHY;  Surgeon: [name], MD;  Location: [place] CV LAB;  Service: Cardiovascular;  Laterality: Bilateral;   Memory loss     NEPHRECTOMY TRANSPLANTED ORGAN     PATCH ANGIOPLASTY Right 08/06/2020   Procedure: PATCH ANGIOPLASTY;  Surgeon: [name], MD;  Location: [place];  Service: Vascular;  Laterality: Right;   PERIPHERAL  VASCULAR BALLOON ANGIOPLASTY Right 09/09/2020   Procedure: PERIPHERAL VASCULAR BALLOON ANGIOPLASTY;  Surgeon: [name], MD;  Location: [place] CV LAB;  Service: Cardiovascular;  Laterality: Right;  popliteal   PERIPHERAL VASCULAR INTERVENTION Right 08/05/2020   Procedure: PERIPHERAL VASCULAR INTERVENTION;  Surgeon: [name], MD;  Location: [place] [facility];  Service: Cardiovascular;  Laterality: Right;  SFA   PERIPHERAL VASCULAR INTERVENTION Right 09/09/2020   Procedure: PERIPHERAL VASCULAR INTERVENTION;  Surgeon: [name], MD;  Location: [place] CV LAB;  Service: Cardiovascular;  Laterality: Right;  external iliac   TEE WITHOUT  CARDIOVERSION N/A 09/18/2019   Procedure: TRANSESOPHAGEAL ECHOCARDIOGRAM (TEE);  Surgeon: Buford Dresser, MD;  Location: [facility] ENDOSCOPY;  Service: Cardiovascular;  Laterality: N/A;   WOUND DEBRIDEMENT Right 10/08/2020   Procedure: DEBRIDEMENT WOUND RIGHT FOOT;  Surgeon: [name], MD;  Location: [place];  Service: Vascular;  Laterality: Right;    There were no vitals filed for this visit.   Subjective Assessment - 04/27/21 1350     Subjective Patient wears prosthesis for 8 hours on nondialysis days and none on dialysis days.    Patient is accompained by: Family member    Pertinent History rt TTA, DM2, HTN, PVD, ESRD w/dialysis, hx right kidney transplant Nov 2015, Stroke, back sg X 2, memory issues    Limitations Lifting;Walking;House hold activities    Patient Stated Goals use prosthesis to get around house & community, be able to get in & out of high truck. be able to go to bathroom without assistance.    Currently in Pain? No/denies                               [facility] Adult PT Treatment/Exercise - 04/27/21 1352       Transfers   Transfers Sit to Stand;Stand to Sit    Sit to Stand 5: Supervision    Sit to Stand Details Verbal cues for technique    Stand to Sit With upper extremity assist;To chair/3-in-1;5: Supervision    Stand to Sit Details (indicate cue type and reason) Verbal cues for technique      Ambulation/Gait   Ambulation/Gait Yes    Ambulation/Gait Assistance 5: Supervision    Ambulation/Gait Assistance Details verbal & tactile cues on upright posture and how wife to safely supervise. she verbalized understanding.    Ambulation Distance (Feet) 62 Feet   48' & 35'   Assistive device Rolling walker;Prosthesis    Ambulation Surface Level;Indoor    Ramp 4: Min assist   RW & TTA prosthesis   Ramp Details (indicate cue type and reason) demo, verbal & tactile cues on technique    Curb 4: Min assist   RW & TTA prosthesis   Curb Details  (indicate cue type and reason) demo, verbal & tactile cues on technique      Exercises   Exercises Knee/Hip      Knee/Hip Exercises: Machines for Strengthening   Total Gym Leg Press shuttle leg press 75# 15 reps 2 sets      Prosthetics   Prosthetic Care Comments  PT weighed prosthesis at 2.8kg. Verbal cues on subtracting weight from weigh-in & weigh-out at dialysis if he wears prosthesis to dialysis.  PT instructed in increased fall risk & stress on LLE when not wearing prosthesis during awake hours.    Current prosthetic wear tolerance (days/week)  4 of 7days per week on nondialysis days only  Current prosthetic wear tolerance (#hours/day)  8 hrs total on nondialysis days    Current prosthetic weight-bearing tolerance (hours/day)  Patient tolerated standing for 5 min with partial weight on prosthesis with no c/o pain or discomfort.    Residual limb condition  no skin issues or changes to limb    Education Provided Proper wear schedule/adjustment;Other (comment);Proper [place]   see prosthetic care comments   Person(s) Educated Patient;Spouse    Education Method Explanation;Demonstration;Tactile cues;Verbal cues    Education Method Verbalized understanding;Verbal cues required;Needs further instruction                       PT Short Term Goals - 04/27/21 1443       PT SHORT TERM GOAL #1   Title Wife verbalizes how to adjust weight at dialysis when wearing prosthesis.    Time 4    Period Weeks    Status On-going    Target Date 05/13/21      PT SHORT TERM GOAL #2   Title Patient tolerates prosthesis >10 hrs total on nondialysis days & >4 hours if out of bed on dialysis days without skin issues or limb pain    Time 4    Period Weeks    Status On-going    Target Date 05/13/21      PT SHORT TERM GOAL #3   Title Patient able to sit to & from stand w/c or chair without armrests to RW with supervision.    Time 4    Period Weeks    Status On-going    Target Date  05/13/21      PT SHORT TERM GOAL #4   Title Patient ambulates 150' with RW & prosthesis with supervision.    Time 4    Period Weeks    Status On-going    Target Date 05/13/21      PT SHORT TERM GOAL #5   Title Patient able to negotiate ramps & curbs with RW with minA from wife safely.    Time 4    Period Weeks    Status On-going    Target Date 05/13/21               PT Long Term Goals - 04/27/21 1443       PT LONG TERM GOAL #1   Title Patient's wife demonstrates & verbalized understanding of prosthetic care to enable safe utilization of prosthesis.    Time 12    Period Weeks    Status On-going    Target Date 06/10/21      PT LONG TERM GOAL #2   Title Patient tolerates prosthesis wear >90% of awake hours without skin or limb pain issues.    Time 12    Period Weeks    Status On-going    Target Date 06/10/21      PT LONG TERM GOAL #3   Title Patient able to perform standing ADLs like managing pants to toilet with RW & prosthesis with supervision.    Time 12    Period Weeks    Status On-going    Target Date 06/10/21      PT LONG TERM GOAL #4   Title Patient ambulates 200' with RW or rollator walker & prosthesis with wife's supervision.    Time 12    Period Weeks    Status On-going    Target Date 06/10/21      PT LONG TERM GOAL #5   Title Patient  negotiates ramps & curbs with RW or rollator walker & prosthesis with minA from wife.    Time 12    Period Weeks    Status On-going    Target Date 06/10/21                   Plan - 04/27/21 1444     Clinical Impression Statement Patient is slowly improving his mobility with less assistance for prosthetic gait with RW.  He continues to be limited in endurance.    Personal Factors and Comorbidities Comorbidity 3+;Fitness;Time since onset of injury/illness/exacerbation    Comorbidities rt TTA, DM2, HTN, PVD, ESRD w/dialysis, hx right kidney transplant Nov 2015, Stroke, back sg X 2, memory loss     Examination-Activity Limitations Stand;Transfers;Toileting;Stairs;Locomotion Level    Examination-Participation Restrictions Community Activity;Other   household mobility & ADLs   Stability/Clinical Decision Making Evolving/Moderate complexity    Rehab Potential Good    PT Frequency 2x / week    PT Duration 12 weeks    PT Treatment/Interventions ADLs/Self Care Home Management;DME Instruction;Gait training;Stair training;Functional mobility training;Therapeutic activities;Therapeutic exercise;Balance training;Neuromuscular re-education;Patient/family education;Prosthetic Training;Vestibular    PT Next Visit Plan do 10th visit note, work towards updated STGs, continue to work to prosthetic gait with RW & standing balance activities, Nustep & leg press, improving wear time    Consulted and Agree with Plan of Care Patient    Family Member Consulted wife, Jonathan [name]             Patient will benefit from skilled therapeutic intervention in order to improve the following deficits and impairments:  Abnormal gait, Decreased activity tolerance, Decreased balance, Decreased endurance, Decreased knowledge of use of DME, Decreased mobility, Decreased strength, Increased edema, Impaired flexibility, Postural dysfunction, Prosthetic Dependency  Visit Diagnosis: Abnormal posture  Other abnormalities of gait and mobility  Unsteadiness on feet  Muscle weakness (generalized)     Problem List Patient Active Problem List   Diagnosis Date Noted   Osteomyelitis ([place]) 10/30/2020   Encounter for general adult medical examination without abnormal findings 09/15/2020   Hemiplegia of dominant side ([place]) 09/15/2020   History of cerebrovascular accident 09/15/2020   History of COVID-19 09/15/2020   Hypercoagulable state ([place]) 09/15/2020   Other chronic osteomyelitis, right ankle and foot ([place]) 09/15/2020   Right flank pain 09/15/2020   Atherosclerosis of native arteries of the extremities with  ulceration ([place]) 08/05/2020   Encounter for removal of sutures 07/27/2020   Neuropathic ulcer of foot, unspecified laterality, limited to breakdown of skin ([place]) 06/09/2020   Anemia 01/16/2020   Headache, unspecified 12/03/2019   Other disorders of phosphorus metabolism 10/25/2019   Unspecified protein-calorie malnutrition ([place]) 09/27/2019   FUO (fever of unknown origin) 09/17/2019   Cerebral thrombosis with cerebral infarction 08/02/2019   Cerebral embolism with cerebral infarction 08/02/2019   Acute respiratory failure due to COVID-19 ([place]) 07/30/2019   Acute respiratory failure with hypoxia ([place]) 07/30/2019   Allergy, unspecified, initial encounter 96/22/2979   Chronic systolic HF (heart failure) ([place]) 02/25/2019   ESRD (end stage renal disease) on dialysis ([place]) 02/25/2019   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 kidney disease, or end stage renal disease ([place]) 12/04/2018   Hypoglycemia, unspecified 08/17/2018   Generalized abdominal pain 07/16/2018   Hypercalcemia 06/28/2018   Arteriovenous fistula for hemodialysis in place, primary ([place]) 05/15/2018   Failed kidney transplant 05/15/2018   Anemia in chronic kidney disease 05/13/2018   Idiopathic gout, unspecified  site 58/25/1898   Complication of vascular dialysis catheter 01/16/2018   Other specified coagulation defects ([place]) 01/16/2018   Pain, unspecified 01/16/2018   Pruritus, unspecified 01/16/2018   Secondary hyperparathyroidism of renal origin ([place]) 01/16/2018   Shortness of breath 01/16/2018   Type 2 diabetes mellitus with diabetic peripheral angiopathy without gangrene ([place]) 01/16/2018   Abnormal gait 01/10/2018   Dependence on renal dialysis ([place]) 01/10/2018   Iron deficiency anemia, unspecified 12/21/2017   Acute blood loss anemia 12/14/2017   Hypomagnesemia 12/03/2017   Anticoagulated 12/02/2017   Stroke ([place]) 11/29/2017   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 11/28/2017   History of MI (myocardial infarction) 11/28/2017   Hypertension 11/28/2017   Chronic hepatitis C without hepatic coma ([place]) 09/25/2017   Diabetes mellitus type 2, uncomplicated ([place]) 42/05/3127   Myocardial infarction ([place]) 09/25/2017   Renal transplant recipient 09/25/2017   Transfusion history 09/25/2017   Vascular dementia without behavioral disturbance ([place]) 03/10/2017   History of coronary artery disease 09/12/2016   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 MI ([place]) 09/12/2016   Acute coronary thrombosis not resulting in myocardial infarction ([place]) 09/12/2016   Aftercare following organ transplant 09/11/2016   Mineral metabolism disorder 09/11/2016   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 [place], [place], [address] Phone: [phone]   Fax:  [phone]  Name: Jonathan FECHER Sr. MRN: [id number] Date of Birth: November 05, 1950

## 2021-04-29 ENCOUNTER — Encounter: Payer: Self-pay | Admitting: Physical Therapy

## 2021-04-29 ENCOUNTER — Other Ambulatory Visit: Payer: Self-pay

## 2021-04-29 ENCOUNTER — Ambulatory Visit (INDEPENDENT_AMBULATORY_CARE_PROVIDER_SITE_OTHER): Payer: Medicare Other | Admitting: Physical Therapy

## 2021-04-29 DIAGNOSIS — M6281 Muscle weakness (generalized): Secondary | ICD-10-CM | POA: Diagnosis not present

## 2021-04-29 DIAGNOSIS — R2689 Other abnormalities of gait and mobility: Secondary | ICD-10-CM | POA: Diagnosis not present

## 2021-04-29 DIAGNOSIS — R2681 Unsteadiness on feet: Secondary | ICD-10-CM | POA: Diagnosis not present

## 2021-04-29 DIAGNOSIS — R293 Abnormal posture: Secondary | ICD-10-CM

## 2021-04-29 NOTE — Therapy (Signed)
[facility] Physical Therapy [address] [place], [place], [address] Phone: [phone]   Fax:  [phone]  Physical Therapy Treatment/10th visit progress note Progress Note reporting period date 03/11/21 to 04/29/21.  See below for objective and subjective measurements relating to patients progress with PT.   Patient Details  Name: Jonathan SECRIST Sr. MRN: [id number] Date of Birth: 1951-06-27 Referring Provider (PT): Jamelle Haring, MD   Encounter Date: 04/29/2021   PT End of Session - 04/29/21 1449     Visit Number 10    Number of Visits 25    Date for PT Re-Evaluation 06/10/21    Authorization Type BCBS Medicare    Authorization Time Period OOP & Ded met, $0 co-pay    Progress Note Due on Visit 20    PT Start Time 1353    PT Stop Time 1440    PT Time Calculation (min) 47 min    Equipment Utilized During Treatment Gait belt    Activity Tolerance Patient tolerated treatment well;Other (comment)   limited by cognitive deficits / memory   Behavior During Therapy Flat affect             Past Medical History:  Diagnosis Date   CAD (coronary artery disease)    CHF (congestive heart failure) ([name])    Diabetes mellitus without complication ([place])    type 2   DM (diabetes mellitus) ([place])    ESRD (end stage renal disease) ([place])    FUO (fever of unknown origin) 09/17/2019   Hyperlipidemia    Hypertension    Osteomyelitis ([place])    Peripheral arterial disease ([name])    PVD (peripheral vascular disease) ([place])    Renal disorder 2015   right kidney transplant   Stroke [facility])    Vascular dementia [facility])     Past Surgical History:  Procedure Laterality Date   ABDOMINAL AORTOGRAM N/A 08/05/2020   Procedure: ABDOMINAL AORTOGRAM;  Surgeon: [name], MD;  Location: [place] [facility];  Service: Cardiovascular;  Laterality: N/A;   ABDOMINAL AORTOGRAM W/LOWER EXTREMITY N/A 09/09/2020   Procedure: ABDOMINAL AORTOGRAM W/LOWER EXTREMITY;  Surgeon: [name], MD;  Location: [place] [facility];  Service: Cardiovascular;  Laterality: N/A;   AMPUTATION Right 08/06/2020   Procedure: Right Fifth Toe Ray Amputation;  Surgeon: [name], MD;  Location: [place];  Service: Vascular;  Laterality: Right;   AMPUTATION Right 11/02/2020   Procedure: RIGHT AMPUTATION BELOW KNEE;  Surgeon: [name], MD;  Location: [place];  Service: Vascular;  Laterality: Right;   BACK SURGERY     Has had 2 back surgeries   BUBBLE STUDY  09/18/2019   Procedure: BUBBLE STUDY;  Surgeon: Buford Dresser, MD;  Location: [place];  Service: Cardiovascular;;   Depression     ENDARTERECTOMY FEMORAL Right 08/06/2020   Procedure: Right Ilio- Femoral Artery Endarterectomy;  Surgeon: [name], MD;  Location: [facility] OR;  Service: Vascular;  Laterality: Right;   HAND SURGERY Right    HAND SURGERY     IR REMOVAL TUN CV CATH W/O [facility]  09/03/2019   KIDNEY TRANSPLANT     November 2015   LEG AMPUTATION BELOW KNEE Right    LOWER EXTREMITY ANGIOGRAPHY Bilateral 08/05/2020   Procedure: LOWER EXTREMITY ANGIOGRAPHY;  Surgeon: [name], MD;  Location: [place] [facility];  Service: Cardiovascular;  Laterality: Bilateral;   Memory loss     NEPHRECTOMY TRANSPLANTED ORGAN     PATCH ANGIOPLASTY Right 08/06/2020  Procedure: PATCH ANGIOPLASTY;  Surgeon: [name], MD;  Location: [facility] OR;  Service: Vascular;  Laterality: Right;   PERIPHERAL VASCULAR BALLOON ANGIOPLASTY Right 09/09/2020   Procedure: PERIPHERAL VASCULAR BALLOON ANGIOPLASTY;  Surgeon: [name], MD;  Location: [place] [facility];  Service: Cardiovascular;  Laterality: Right;  popliteal   PERIPHERAL VASCULAR INTERVENTION Right 08/05/2020   Procedure: PERIPHERAL VASCULAR INTERVENTION;  Surgeon: [name], MD;  Location: [place] [facility];  Service: Cardiovascular;  Laterality: Right;  SFA   PERIPHERAL VASCULAR INTERVENTION Right 09/09/2020   Procedure: PERIPHERAL VASCULAR  INTERVENTION;  Surgeon: [name], MD;  Location: [place] [facility];  Service: Cardiovascular;  Laterality: Right;  external iliac   TEE WITHOUT CARDIOVERSION N/A 09/18/2019   Procedure: TRANSESOPHAGEAL ECHOCARDIOGRAM (TEE);  Surgeon: Buford Dresser, MD;  Location: [facility] ENDOSCOPY;  Service: Cardiovascular;  Laterality: N/A;   WOUND DEBRIDEMENT Right 10/08/2020   Procedure: DEBRIDEMENT WOUND RIGHT FOOT;  Surgeon: [name], MD;  Location: [place];  Service: Vascular;  Laterality: Right;    There were no vitals filed for this visit.   Subjective Assessment - 04/29/21 1430     Subjective Patient wears prosthesis for 8-9 hours on nondialysis days, he denies pain but his wife states he is becoming very frail and lacks energy. and wants to know about any supplments or vitamins he should take. PT recommended she ask his PCP about this but for a general recommendation to increased protein intake.    Patient is accompained by: Family member    Pertinent History rt TTA, DM2, HTN, PVD, ESRD w/dialysis, hx right kidney transplant Nov 2015, Stroke, back sg X 2, memory issues    Limitations Lifting;Walking;House hold activities    Patient Stated Goals use prosthesis to get around house & community, be able to get in & out of high truck. be able to go to bathroom without assistance.                               [place] Adult PT Treatment/Exercise - 04/29/21 0001       Transfers   Transfers Sit to Stand;Stand to Sit    Sit to Stand 5: Supervision    Sit to Stand Details Verbal cues for technique    Stand to Sit With upper extremity assist;To chair/3-in-1;5: Supervision    Stand to Sit Details (indicate cue type and reason) Verbal cues for technique      Ambulation/Gait   Ambulation/Gait Yes    Ambulation/Gait Assistance 4: Min guard    Ambulation Distance (Feet) 70 Feet   then 20' & 35'   Assistive device Rolling walker;Prosthesis    Ramp 4: Min assist   RW &  TTA prosthesis   Ramp Details (indicate cue type and reason) demo, verbal & tactile cues on technique    Curb 4: Min assist   RW & TTA prosthesis   Curb Details (indicate cue type and reason) demo, verbal & tactile cues on technique      Knee/Hip Exercises: Machines for Strengthening   Total Gym Leg Press shuttle leg press 75# 10 reps 3 sets                       PT Short Term Goals - 04/29/21 1451       PT SHORT TERM GOAL #1   Title Wife verbalizes how to adjust weight at dialysis when  wearing prosthesis.    Baseline printed out instructions today and she verbalizes understanding    Time 4    Period Weeks    Status On-going    Target Date 05/13/21      PT SHORT TERM GOAL #2   Title Patient tolerates prosthesis >10 hrs total on nondialysis days & >4 hours if out of bed on dialysis days without skin issues or limb pain    Baseline around 8 hours on nondialysis days    Time 4    Period Weeks    Status On-going    Target Date 05/13/21      PT SHORT TERM GOAL #3   Title Patient able to sit to & from stand w/c or chair without armrests to RW with supervision.    Baseline met 9/22    Time 4    Period Weeks    Status Achieved    Target Date 05/13/21      PT SHORT TERM GOAL #4   Title Patient ambulates 150' with RW & prosthesis with supervision.    Baseline up to 70' on 9/22    Time 4    Period Weeks    Status On-going    Target Date 05/13/21      PT SHORT TERM GOAL #5   Title Patient able to negotiate ramps & curbs with RW with minA from wife safely.    Baseline was min A with PT, wife had to step out for phone call during this, she does verbalize understanding of how to perform.    Time 4    Period Weeks    Status On-going    Target Date 05/13/21               PT Long Term Goals - 04/27/21 1443       PT LONG TERM GOAL #1   Title Patient's wife demonstrates & verbalized understanding of prosthetic care to enable safe utilization of prosthesis.     Time 12    Period Weeks    Status On-going    Target Date 06/10/21      PT LONG TERM GOAL #2   Title Patient tolerates prosthesis wear >90% of awake hours without skin or limb pain issues.    Time 12    Period Weeks    Status On-going    Target Date 06/10/21      PT LONG TERM GOAL #3   Title Patient able to perform standing ADLs like managing pants to toilet with RW & prosthesis with supervision.    Time 12    Period Weeks    Status On-going    Target Date 06/10/21      PT LONG TERM GOAL #4   Title Patient ambulates 200' with RW or rollator walker & prosthesis with wife's supervision.    Time 12    Period Weeks    Status On-going    Target Date 06/10/21      PT LONG TERM GOAL #5   Title Patient negotiates ramps & curbs with RW or rollator walker & prosthesis with minA from wife.    Time 12    Period Weeks    Status On-going    Target Date 06/10/21                   Plan - 04/29/21 1451     Clinical Impression Statement 10th visit progress note reflects slow overall progress and has not yet met his short  term goals but is progressing in them. He remains limited by endurance and cognitive, and self motivational deficits but his wife is very supportive and definitely helps improve his participation. He will continue to benefit from skilled PT to address his funcitonal impairments and improve his functional mobility.    Personal Factors and Comorbidities Comorbidity 3+;Fitness;Time since onset of injury/illness/exacerbation    Comorbidities rt TTA, DM2, HTN, PVD, ESRD w/dialysis, hx right kidney transplant Nov 2015, Stroke, back sg X 2, memory loss    Examination-Activity Limitations Stand;Transfers;Toileting;Stairs;Locomotion Level    Examination-Participation Restrictions Community Activity;Other   household mobility & ADLs   Stability/Clinical Decision Making Evolving/Moderate complexity    Rehab Potential Good    PT Frequency 2x / week    PT Duration 12 weeks     PT Treatment/Interventions ADLs/Self Care Home Management;DME Instruction;Gait training;Stair training;Functional mobility training;Therapeutic activities;Therapeutic exercise;Balance training;Neuromuscular re-education;Patient/family education;Prosthetic Training;Vestibular    PT Next Visit Plan work towards updated STGs, continue to work to prosthetic gait with RW & standing balance activities, Nustep & leg press, improving wear time    Consulted and Agree with Plan of Care Patient    Family Member Consulted wife, Neal Trulson             Patient will benefit from skilled therapeutic intervention in order to improve the following deficits and impairments:  Abnormal gait, Decreased activity tolerance, Decreased balance, Decreased endurance, Decreased knowledge of use of DME, Decreased mobility, Decreased strength, Increased edema, Impaired flexibility, Postural dysfunction, Prosthetic Dependency  Visit Diagnosis: Abnormal posture  Other abnormalities of gait and mobility  Unsteadiness on feet  Muscle weakness (generalized)     Problem List Patient Active Problem List   Diagnosis Date Noted   Osteomyelitis ([place]) 10/30/2020   Encounter for general adult medical examination without abnormal findings 09/15/2020   Hemiplegia of dominant side ([place]) 09/15/2020   History of cerebrovascular accident 09/15/2020   History of COVID-19 09/15/2020   Hypercoagulable state ([place]) 09/15/2020   Other chronic osteomyelitis, right ankle and foot ([place]) 09/15/2020   Right flank pain 09/15/2020   Atherosclerosis of native arteries of the extremities with ulceration ([place]) 08/05/2020   Encounter for removal of sutures 07/27/2020   Neuropathic ulcer of foot, unspecified laterality, limited to breakdown of skin ([place]) 06/09/2020   Anemia 01/16/2020   Headache, unspecified 12/03/2019   Other disorders of phosphorus metabolism 10/25/2019   Unspecified protein-calorie malnutrition ([place]) 09/27/2019    FUO (fever of unknown origin) 09/17/2019   Cerebral thrombosis with cerebral infarction 08/02/2019   Cerebral embolism with cerebral infarction 08/02/2019   Acute respiratory failure due to COVID-19 ([place]) 07/30/2019   Acute respiratory failure with hypoxia ([place]) 07/30/2019   Allergy, unspecified, initial encounter 78/29/5621   Chronic systolic HF (heart failure) ([place]) 02/25/2019   ESRD (end stage renal disease) on dialysis ([place]) 02/25/2019   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 kidney disease, or end stage renal disease ([place]) 12/04/2018   Hypoglycemia, unspecified 08/17/2018   Generalized abdominal pain 07/16/2018   Hypercalcemia 06/28/2018   Arteriovenous fistula for hemodialysis in place, primary ([place]) 05/15/2018   Failed kidney transplant 05/15/2018   Anemia in chronic kidney disease 05/13/2018   Idiopathic gout, unspecified site 30/86/5784   Complication of vascular dialysis catheter 01/16/2018   Other specified coagulation defects (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 11/28/2017   History of MI (myocardial infarction) 11/28/2017   Hypertension 11/28/2017   Chronic hepatitis C without hepatic coma ([place]) 09/25/2017   Diabetes mellitus type 2, uncomplicated ([place]) 78/41/2820   Myocardial infarction ([place]) 09/25/2017   Renal transplant recipient 09/25/2017   Transfusion history 09/25/2017    Vascular dementia without behavioral disturbance ([place]) 03/10/2017   History of coronary artery disease 09/12/2016   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 MI ([place]) 09/12/2016   Acute coronary thrombosis not resulting in myocardial infarction ([place]) 09/12/2016   Aftercare following organ transplant 09/11/2016   Mineral metabolism disorder 09/11/2016   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 [place], [place], [address] Phone: [phone]   Fax:  [phone]  Name: Jonathan BAINES Sr. MRN: [id number] Date of Birth: [birth year]

## 2021-05-04 ENCOUNTER — Other Ambulatory Visit: Payer: Self-pay

## 2021-05-04 ENCOUNTER — Encounter: Payer: Self-pay | Admitting: Physical Therapy

## 2021-05-04 ENCOUNTER — Other Ambulatory Visit (INDEPENDENT_AMBULATORY_CARE_PROVIDER_SITE_OTHER): Payer: Self-pay

## 2021-05-04 ENCOUNTER — Ambulatory Visit (INDEPENDENT_AMBULATORY_CARE_PROVIDER_SITE_OTHER): Payer: Medicare Other | Admitting: Physical Therapy

## 2021-05-04 ENCOUNTER — Ambulatory Visit: Payer: Medicare Other | Attending: Internal Medicine

## 2021-05-04 DIAGNOSIS — R1312 Dysphagia, oropharyngeal phase: Secondary | ICD-10-CM | POA: Insufficient documentation

## 2021-05-04 DIAGNOSIS — R41841 Cognitive communication deficit: Secondary | ICD-10-CM | POA: Diagnosis present

## 2021-05-04 DIAGNOSIS — R293 Abnormal posture: Secondary | ICD-10-CM | POA: Diagnosis not present

## 2021-05-04 DIAGNOSIS — R2689 Other abnormalities of gait and mobility: Secondary | ICD-10-CM | POA: Diagnosis not present

## 2021-05-04 DIAGNOSIS — M6281 Muscle weakness (generalized): Secondary | ICD-10-CM

## 2021-05-04 DIAGNOSIS — Z0289 Encounter for other administrative examinations: Secondary | ICD-10-CM

## 2021-05-04 DIAGNOSIS — R2681 Unsteadiness on feet: Secondary | ICD-10-CM

## 2021-05-04 NOTE — Patient Instructions (Addendum)
On non-dialysis days: Wear prosthesis from arising to bedtime. Try to walk once per hour from one room to another room in house.  Try once per day to walk as far as he can. Wife to follow with w/c. (PT demo how to safely guard pt & bring along w/c)   Speak with dietician & kidney doctor regarding supplements to assist with energy.

## 2021-05-04 NOTE — Therapy (Signed)
[facility] [address] [place], [place], [address] Phone: [phone]   Fax:  [phone]  Speech Language Pathology Evaluation  Patient Details  Name: Jonathan MERRIGAN Sr. MRN: [id number] Date of Birth: March 16, 1951 Referring Provider (SLP): Ginger Organ., MD   Encounter Date: 05/04/2021   End of Session - 05/04/21 1149     Visit Number 1    Number of Visits 1    SLP Start Time 1015    SLP Stop Time  1057    SLP Time Calculation (min) 42 min    Activity Tolerance Patient tolerated treatment well             Past Medical History:  Diagnosis Date   CAD (coronary artery disease)    CHF (congestive heart failure) ([name])    Diabetes mellitus without complication ([place])    type 2   DM (diabetes mellitus) ([place])    ESRD (end stage renal disease) ([place])    FUO (fever of unknown origin) 09/17/2019   Hyperlipidemia    Hypertension    Osteomyelitis ([place])    Peripheral arterial disease ([name])    PVD (peripheral vascular disease) ([place])    Renal disorder 2015   right kidney transplant   Stroke [facility])    Vascular dementia [facility])     Past Surgical History:  Procedure Laterality Date   ABDOMINAL AORTOGRAM N/A 08/05/2020   Procedure: ABDOMINAL AORTOGRAM;  Surgeon: [name], MD;  Location: [facility];  Service: Cardiovascular;  Laterality: N/A;   ABDOMINAL AORTOGRAM W/LOWER EXTREMITY N/A 09/09/2020   Procedure: ABDOMINAL AORTOGRAM W/LOWER EXTREMITY;  Surgeon: [name], MD;  Location: [place] [facility];  Service: Cardiovascular;  Laterality: N/A;   AMPUTATION Right 08/06/2020   Procedure: Right Fifth Toe Ray Amputation;  Surgeon: [name], MD;  Location: [facility];  Service: Vascular;  Laterality: Right;   AMPUTATION Right 11/02/2020   Procedure: RIGHT AMPUTATION BELOW KNEE;  Surgeon: [name], MD;  Location: [place];  Service: Vascular;  Laterality: Right;   BACK SURGERY     Has had 2  back surgeries   BUBBLE STUDY  09/18/2019   Procedure: BUBBLE STUDY;  Surgeon: Buford Dresser, MD;  Location: [facility] [place];  Service: Cardiovascular;;   Depression     ENDARTERECTOMY FEMORAL Right 08/06/2020   Procedure: Right Ilio- Femoral Artery Endarterectomy;  Surgeon: [name], MD;  Location: [facility] OR;  Service: Vascular;  Laterality: Right;   HAND SURGERY Right    HAND SURGERY     IR REMOVAL TUN CV CATH W/O [facility]  09/03/2019   KIDNEY TRANSPLANT     November 2015   LEG AMPUTATION BELOW KNEE Right    LOWER EXTREMITY ANGIOGRAPHY Bilateral 08/05/2020   Procedure: LOWER EXTREMITY ANGIOGRAPHY;  Surgeon: [name], MD;  Location: [place] [facility];  Service: Cardiovascular;  Laterality: Bilateral;   Memory loss     NEPHRECTOMY TRANSPLANTED ORGAN     PATCH ANGIOPLASTY Right 08/06/2020   Procedure: PATCH ANGIOPLASTY;  Surgeon: [name], MD;  Location: [facility] OR;  Service: Vascular;  Laterality: Right;   PERIPHERAL VASCULAR BALLOON ANGIOPLASTY Right 09/09/2020   Procedure: PERIPHERAL VASCULAR BALLOON ANGIOPLASTY;  Surgeon: [name], MD;  Location: [place];  Service: Cardiovascular;  Laterality: Right;  popliteal   PERIPHERAL VASCULAR INTERVENTION Right 08/05/2020   Procedure: PERIPHERAL VASCULAR INTERVENTION;  Surgeon: [name], MD;  Location: [place] [facility];  Service: Cardiovascular;  Laterality: Right;  SFA   PERIPHERAL VASCULAR INTERVENTION Right 09/09/2020   Procedure: PERIPHERAL VASCULAR INTERVENTION;  Surgeon: [name], MD;  Location: [place] [facility];  Service: Cardiovascular;  Laterality: Right;  external iliac   TEE WITHOUT CARDIOVERSION N/A 09/18/2019   Procedure: TRANSESOPHAGEAL ECHOCARDIOGRAM (TEE);  Surgeon: Buford Dresser, MD;  Location: [facility] ENDOSCOPY;  Service: Cardiovascular;  Laterality: N/A;   WOUND DEBRIDEMENT Right 10/08/2020   Procedure: DEBRIDEMENT WOUND RIGHT FOOT;  Surgeon: [name], MD;   Location: [facility];  Service: Vascular;  Laterality: Right;    There were no vitals filed for this visit.       SLP Evaluation [facility] - 05/04/21 1011       SLP Visit Information   SLP Received On 04/15/21    Referring Provider (SLP) Ginger Organ., MD    Onset Date CVA in December 2020    Medical Diagnosis Acute cough   dementia, CVA     Subjective   Subjective patient with limited verbal output/engagement    Patient/Family Stated Goal "to make sure nothing is sticking in his throat that could make him sick"      Pain Assessment   Currently in Pain? No/denies      General Information   HPI 70 year old male with longstanding progressive memory and cognitive impairment following strokes in December 2020 likely from vascular dementia.  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  Vascular risk factors of diabetes, hyperlipidemia, hypertension cardiomyopathy, and CHF    Behavioral/Cognition wife provided entirety of history    Mobility Status wheelchair - working with PT      Balance Screen   Has the patient fallen in the past 6 months No      Prior Functional Status   Cognitive/Linguistic Baseline Within functional limits   prior to CVAs in 07/2019 and dementia   Type of Home House     Lives With Spouse    Available Support Family      Cognition   Overall Cognitive Status History of cognitive impairments - at baseline   CVA, dementia     Auditory Comprehension   Overall Auditory Comprehension Impaired at baseline    Conversation Simple    Interfering Components Attention;Motor planning;Processing speed;Working Armed forces training and education officer Expression   Overall Verbal Expression Impaired at baseline   consistent cues to speak     Oral Motor/Sensory Function   Overall Oral Motor/Sensory Function Impaired   mild   Labial Symmetry Within Functional Limits    Labial Strength Reduced     Labial Coordination Reduced    Lingual ROM Within Functional Limits    Lingual Symmetry Within Functional Limits    Lingual Strength Reduced    Lingual Coordination Reduced    Facial Symmetry Within Functional Limits    Facial Coordination Reduced      Motor Speech   Overall Motor Speech Appears within functional limits for tasks assessed                             SLP Education - 05/04/21 1059     Education Details eval results, swallow precautions and recommendations    Person(s) Educated Patient;Spouse    Methods Explanation;Demonstration;Handout    Comprehension Verbalized understanding;Returned demonstration  Plan - 05/04/21 1149     Clinical Impression Statement "Amour" was referred for OPST due to acute cough. Pt was accompanied by wife, [name], who provided entirety of history as pt has dementia and exhibits limited verbal output unless directly prompted. Pt's wife reported swallow function has not really changed since last ST evaluation in March 2022, but she would like to ensure nothing was "collecting in his throat and going bad." SLP reviewed previous SLP reports and MBSS in 2021 with pt and his wife, which indicated mild oropharyngeal dysphagia (related to cognitive deficits) with no aspiration or residue exhibited. Due to cognitive status, pt's wife reports he "pockets in his mouth" and requires cues. Wife reports he sometimes coughs if he "over packs" or gets too much food. His wife indicates he currently has a cold with some coughing and runny nose indicated, but has improved. No coughing exhibited at baseline or during PO trials today. Per wife, CXR completed ~3 weeks ago which was "clear." No recent hx of PNA reported. OME revealed some missing dentition, slightly reduced cough strength, and mildly reduced lingual and labial coordination and strength. PO trials of regular textures and thin liquids completed, with adequate  mastication, minimal oral resiude which cleared with liquid wash, and possible delayed swallow initiations. No coughing exhibited with rare throat clears noted x2. No wet vocal quality or change in respiration exhibited throughout PO trials. Pt denied sensation of pharyngeal residue during PO trials. Pt's wife is reportedly cutting meats into small bites and providing cues during meals. SLP recommended that pt's wife provide direct supervision with all PO intake, cut/break all food into smaller pieces to discourage large bites, limit amount on plate at a time due to impulsivity, use smaller utensils, and place cup within line of vision to cue patient to drink after 1-2 bites. Given no significant change in swallow function reported and no recent PNA documented, no further skilled ST intervention warranted at this time. SLP educated pt and wife on ability to return for repeat ST evaluation with new script if change in baseline swallow function exhibited. Pt's wife vebalized understanding and agreement with SLP recommendations, denied further questions, and thanked SLP for evaluation.    Speech Therapy Frequency One time visit    Treatment/Interventions Compensatory strategies;SLP instruction and feedback;Patient/family education    Potential to Achieve Goals Fair    Potential Considerations Medical prognosis;Ability to learn/carryover information;Previous level of function    SLP Home Exercise Plan recommendations provided    Consulted and Agree with Plan of Care Patient;Family member/caregiver             Patient will benefit from skilled therapeutic intervention in order to improve the following deficits and impairments:   Dysphagia, oropharyngeal phase  Cognitive communication deficit    Problem List Patient Active Problem List   Diagnosis Date Noted   Osteomyelitis ([place]) 10/30/2020   Encounter for general adult medical examination without abnormal findings 09/15/2020   Hemiplegia of  dominant side ([place]) 09/15/2020   History of cerebrovascular accident 09/15/2020   History of COVID-19 09/15/2020   Hypercoagulable state ([place]) 09/15/2020   Other chronic osteomyelitis, right ankle and foot ([place]) 09/15/2020   Right flank pain 09/15/2020   Atherosclerosis of native arteries of the extremities with ulceration ([place]) 08/05/2020   Encounter for removal of sutures 07/27/2020   Neuropathic ulcer of foot, unspecified laterality, limited to breakdown of skin ([place]) 06/09/2020   Anemia 01/16/2020   Headache, unspecified 12/03/2019   Other disorders of  phosphorus metabolism 10/25/2019   Unspecified protein-calorie malnutrition ([place]) 09/27/2019   FUO (fever of unknown origin) 09/17/2019   Cerebral thrombosis with cerebral infarction 08/02/2019   Cerebral embolism with cerebral infarction 08/02/2019   Acute respiratory failure due to COVID-19 [facility]) 07/30/2019   Acute respiratory failure with hypoxia ([place]) 07/30/2019   Allergy, unspecified, initial encounter [id number]   Chronic systolic HF (heart failure) ([place]) 02/25/2019   ESRD (end stage renal disease) on dialysis ([place]) 02/25/2019   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 kidney disease, or end stage renal disease ([place]) 12/04/2018   Hypoglycemia, unspecified 08/17/2018   Generalized abdominal pain 07/16/2018   Hypercalcemia 06/28/2018   Arteriovenous fistula for hemodialysis in place, primary ([place]) 05/15/2018   Failed kidney transplant 05/15/2018   Anemia in chronic kidney disease 05/13/2018   Idiopathic gout, unspecified site 0000000   Complication of vascular dialysis catheter 01/16/2018   Other specified coagulation defects ([place]) 01/16/2018   Pain, unspecified 01/16/2018   Pruritus, unspecified 01/16/2018   Secondary hyperparathyroidism of renal origin ([place]) 01/16/2018   Shortness of breath 01/16/2018   Type 2 diabetes mellitus with diabetic peripheral angiopathy without  gangrene ([place]) 01/16/2018   Abnormal gait 01/10/2018   Dependence on renal dialysis ([place]) 01/10/2018   Iron deficiency anemia, unspecified 12/21/2017   Acute blood loss anemia 12/14/2017   Hypomagnesemia 12/03/2017   Anticoagulated 12/02/2017   Stroke ([place]) 11/29/2017   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 11/28/2017   History of MI (myocardial infarction) 11/28/2017   Hypertension 11/28/2017   Chronic hepatitis C without hepatic coma ([place]) 09/25/2017   Diabetes mellitus type 2, uncomplicated ([name]) [id number]   Myocardial infarction ([place]) 09/25/2017   Renal transplant recipient 09/25/2017   Transfusion history 09/25/2017   Vascular dementia without behavioral disturbance ([place]) 03/10/2017   History of coronary artery disease 09/12/2016   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 MI ([place]) 09/12/2016   Acute coronary thrombosis not resulting in myocardial infarction ([place]) 09/12/2016   Aftercare following organ transplant 09/11/2016   Mineral metabolism disorder 09/11/2016   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 [place], [place], [address] Phone: [phone]   Fax:  [phone]  Name: SAYAN GIESER Sr. MRN: [id number] Date of Birth: 1951-07-11

## 2021-05-04 NOTE — Therapy (Signed)
[facility] [address] [place], [place], [address] Phone: [phone]   Fax:  [phone]  Physical Therapy Treatment  Patient Details  Name: Jonathan SALLS Sr. MRN: [id number] Date of Birth: 10-18-1950 Referring Provider (PT): Jamelle Haring, MD   Encounter Date: 05/04/2021   PT End of Session - 05/04/21 1353     Visit Number 11    Number of Visits 25    Date for PT Re-Evaluation 06/10/21    Authorization Type BCBS Medicare    Authorization Time Period OOP & Ded met, $0 co-pay    Progress Note Due on Visit 20    PT Start Time 1345    PT Stop Time 1428    PT Time Calculation (min) 43 min    Equipment Utilized During Treatment Gait belt    Activity Tolerance Patient tolerated treatment well;Other (comment)   limited by cognitive deficits / memory   Behavior During Therapy Flat affect             Past Medical History:  Diagnosis Date   CAD (coronary artery disease)    CHF (congestive heart failure) ([name])    Diabetes mellitus without complication ([place])    type 2   DM (diabetes mellitus) ([place])    ESRD (end stage renal disease) ([place])    FUO (fever of unknown origin) 09/17/2019   Hyperlipidemia    Hypertension    Osteomyelitis ([place])    Peripheral arterial disease ([name])    PVD (peripheral vascular disease) ([place])    Renal disorder 2015   right kidney transplant   Stroke [facility])    Vascular dementia [facility])     Past Surgical History:  Procedure Laterality Date   ABDOMINAL AORTOGRAM N/A 08/05/2020   Procedure: ABDOMINAL AORTOGRAM;  Surgeon: [name], MD;  Location: [place] [facility];  Service: Cardiovascular;  Laterality: N/A;   ABDOMINAL AORTOGRAM W/LOWER EXTREMITY N/A 09/09/2020   Procedure: ABDOMINAL AORTOGRAM W/LOWER EXTREMITY;  Surgeon: [name], MD;  Location: [place] [facility];  Service: Cardiovascular;  Laterality: N/A;   AMPUTATION Right 08/06/2020   Procedure: Right Fifth Toe Ray Amputation;  Surgeon:  [name], MD;  Location: [place];  Service: Vascular;  Laterality: Right;   AMPUTATION Right 11/02/2020   Procedure: RIGHT AMPUTATION BELOW KNEE;  Surgeon: [name], MD;  Location: [place];  Service: Vascular;  Laterality: Right;   BACK SURGERY     Has had 2 back surgeries   BUBBLE STUDY  09/18/2019   Procedure: BUBBLE STUDY;  Surgeon: Buford Dresser, MD;  Location: [place];  Service: Cardiovascular;;   Depression     ENDARTERECTOMY FEMORAL Right 08/06/2020   Procedure: Right Ilio- Femoral Artery Endarterectomy;  Surgeon: [name], MD;  Location: [facility] OR;  Service: Vascular;  Laterality: Right;   HAND SURGERY Right    HAND SURGERY     IR REMOVAL TUN CV CATH W/O [facility]  09/03/2019   KIDNEY TRANSPLANT     November 2015   LEG AMPUTATION BELOW KNEE Right    LOWER EXTREMITY ANGIOGRAPHY Bilateral 08/05/2020   Procedure: LOWER EXTREMITY ANGIOGRAPHY;  Surgeon: [name], MD;  Location: [place] [facility];  Service: Cardiovascular;  Laterality: Bilateral;   Memory loss     NEPHRECTOMY TRANSPLANTED ORGAN     PATCH ANGIOPLASTY Right 08/06/2020   Procedure: PATCH ANGIOPLASTY;  Surgeon: [name], MD;  Location: [place];  Service: Vascular;  Laterality: Right;   PERIPHERAL VASCULAR BALLOON  ANGIOPLASTY Right 09/09/2020   Procedure: PERIPHERAL VASCULAR BALLOON ANGIOPLASTY;  Surgeon: [name], MD;  Location: [place] [facility];  Service: Cardiovascular;  Laterality: Right;  popliteal   PERIPHERAL VASCULAR INTERVENTION Right 08/05/2020   Procedure: PERIPHERAL VASCULAR INTERVENTION;  Surgeon: [name], MD;  Location: [place] [facility];  Service: Cardiovascular;  Laterality: Right;  SFA   PERIPHERAL VASCULAR INTERVENTION Right 09/09/2020   Procedure: PERIPHERAL VASCULAR INTERVENTION;  Surgeon: [name], MD;  Location: [place] [facility];  Service: Cardiovascular;  Laterality: Right;  external iliac   TEE WITHOUT CARDIOVERSION N/A 09/18/2019    Procedure: TRANSESOPHAGEAL ECHOCARDIOGRAM (TEE);  Surgeon: Buford Dresser, MD;  Location: [facility] ENDOSCOPY;  Service: Cardiovascular;  Laterality: N/A;   WOUND DEBRIDEMENT Right 10/08/2020   Procedure: DEBRIDEMENT WOUND RIGHT FOOT;  Surgeon: [name], MD;  Location: [place];  Service: Vascular;  Laterality: Right;    There were no vitals filed for this visit.   Subjective Assessment - 05/04/21 1345     Subjective He wears prosthesis within 30 min of arising to within 30 min of bedtime on nondialysis days. He does not wear it on dialysis days.    Patient is accompained by: Family member    Pertinent History rt TTA, DM2, HTN, PVD, ESRD w/dialysis, hx right kidney transplant Nov 2015, Stroke, back sg X 2, memory issues    Limitations Lifting;Walking;House hold activities    Patient Stated Goals use prosthesis to get around house & community, be able to get in & out of high truck. be able to go to bathroom without assistance.    Currently in Pain? No/denies                               [facility] Adult PT Treatment/Exercise - 05/04/21 1345       Transfers   Transfers Sit to Stand;Stand to Sit    Sit to Stand 5: Supervision    Sit to Stand Details Verbal cues for technique    Stand to Sit With upper extremity assist;To chair/3-in-1;5: Supervision    Stand to Sit Details (indicate cue type and reason) Verbal cues for technique      Ambulation/Gait   Ambulation/Gait Yes    Ambulation/Gait Assistance 5: Supervision;4: Min guard    Ambulation/Gait Assistance Details tactile & verbal cues on upright posture looking forward & wt shift over prosthesis in stance    Ambulation Distance (Feet) 98 Feet   98' (max tolerable distance) & 75'   Assistive device Rolling walker;Prosthesis    Ramp 4: Min assist   RW & TTA prosthesis   Curb 4: Min assist   RW & TTA prosthesis     Exercises   Exercises Knee/Hip    Other Exercises  back extension AROM - standing with posterior  pelvis to countertop leaning forward to armrests of chair in front of him back to upright with hands on counter beside hips.  3 reps with 1st rep he had to use back of chair to achieve upright.  Static stance with hands on counter beside hips for 1 min.      Prosthetics   Current prosthetic wear tolerance (days/week)  4 of 7days per week on nondialysis days only    Current prosthetic wear tolerance (#hours/day)  8 hrs total on nondialysis days which is most of awake hours.    Current prosthetic weight-bearing tolerance (hours/day)  Patient tolerated standing for  5 min with partial weight on prosthesis with no c/o pain or discomfort.    Residual limb condition  no skin issues or changes to limb    Education Provided Proper wear schedule/adjustment;Other (comment)   see prosthetic care comments   Person(s) Educated Patient;Spouse                     PT Education - 05/04/21 1444     Education Details see pt instructions section    Person(s) Educated Patient;Spouse    Methods Explanation;Verbal cues;Demonstration    Comprehension Verbalized understanding   wife verbalized understanding.             PT Short Term Goals - 04/29/21 1451       PT SHORT TERM GOAL #1   Title Wife verbalizes how to adjust weight at dialysis when wearing prosthesis.    Baseline printed out instructions today and she verbalizes understanding    Time 4    Period Weeks    Status On-going    Target Date 05/13/21      PT SHORT TERM GOAL #2   Title Patient tolerates prosthesis >10 hrs total on nondialysis days & >4 hours if out of bed on dialysis days without skin issues or limb pain    Baseline around 8 hours on nondialysis days    Time 4    Period Weeks    Status On-going    Target Date 05/13/21      PT SHORT TERM GOAL #3   Title Patient able to sit to & from stand w/c or chair without armrests to RW with supervision.    Baseline met 9/22    Time 4    Period Weeks    Status Achieved     Target Date 05/13/21      PT SHORT TERM GOAL #4   Title Patient ambulates 150' with RW & prosthesis with supervision.    Baseline up to 70' on 9/22    Time 4    Period Weeks    Status On-going    Target Date 05/13/21      PT SHORT TERM GOAL #5   Title Patient able to negotiate ramps & curbs with RW with minA from wife safely.    Baseline was min A with PT, wife had to step out for phone call during this, she does verbalize understanding of how to perform.    Time 4    Period Weeks    Status On-going    Target Date 05/13/21               PT Long Term Goals - 04/27/21 1443       PT LONG TERM GOAL #1   Title Patient's wife demonstrates & verbalized understanding of prosthetic care to enable safe utilization of prosthesis.    Time 12    Period Weeks    Status On-going    Target Date 06/10/21      PT LONG TERM GOAL #2   Title Patient tolerates prosthesis wear >90% of awake hours without skin or limb pain issues.    Time 12    Period Weeks    Status On-going    Target Date 06/10/21      PT LONG TERM GOAL #3   Title Patient able to perform standing ADLs like managing pants to toilet with RW & prosthesis with supervision.    Time 12    Period Weeks    Status On-going  Target Date 06/10/21      PT LONG TERM GOAL #4   Title Patient ambulates 200' with RW or rollator walker & prosthesis with wife's supervision.    Time 12    Period Weeks    Status On-going    Target Date 06/10/21      PT LONG TERM GOAL #5   Title Patient negotiates ramps & curbs with RW or rollator walker & prosthesis with minA from wife.    Time 12    Period Weeks    Status On-going    Target Date 06/10/21                   Plan - 05/04/21 1353     Clinical Impression Statement Patient was able to increase maxmial tolerable distance to 98'.  PT discussed with wife & pt need to progressively increase activity frequency & max tolerable which will build tolerance. She appears to  understand.    Personal Factors and Comorbidities Comorbidity 3+;Fitness;Time since onset of injury/illness/exacerbation    Comorbidities rt TTA, DM2, HTN, PVD, ESRD w/dialysis, hx right kidney transplant Nov 2015, Stroke, back sg X 2, memory loss    Examination-Activity Limitations Stand;Transfers;Toileting;Stairs;Locomotion Level    Examination-Participation Restrictions Community Activity;Other   household mobility & ADLs   Stability/Clinical Decision Making Evolving/Moderate complexity    Rehab Potential Good    PT Frequency 2x / week    PT Duration 12 weeks    PT Treatment/Interventions ADLs/Self Care Home Management;DME Instruction;Gait training;Stair training;Functional mobility training;Therapeutic activities;Therapeutic exercise;Balance training;Neuromuscular re-education;Patient/family education;Prosthetic Training;Vestibular    PT Next Visit Plan work on active back extension standing with posterior pelvis to counter for strength & standing balance.    work towards updated STGs, continue to work to prosthetic gait with RW & standing balance activities, Nustep & leg press if time permits, improving wear time    Consulted and Agree with Plan of Care Patient    Family Member Consulted wife, Wilson Dusenbery             Patient will benefit from skilled therapeutic intervention in order to improve the following deficits and impairments:  Abnormal gait, Decreased activity tolerance, Decreased balance, Decreased endurance, Decreased knowledge of use of DME, Decreased mobility, Decreased strength, Increased edema, Impaired flexibility, Postural dysfunction, Prosthetic Dependency  Visit Diagnosis: Abnormal posture  Other abnormalities of gait and mobility  Unsteadiness on feet  Muscle weakness (generalized)     Problem List Patient Active Problem List   Diagnosis Date Noted   Osteomyelitis ([place]) 10/30/2020   Encounter for general adult medical examination without abnormal  findings 09/15/2020   Hemiplegia of dominant side ([place]) 09/15/2020   History of cerebrovascular accident 09/15/2020   History of COVID-19 09/15/2020   Hypercoagulable state ([place]) 09/15/2020   Other chronic osteomyelitis, right ankle and foot ([place]) 09/15/2020   Right flank pain 09/15/2020   Atherosclerosis of native arteries of the extremities with ulceration ([place]) 08/05/2020   Encounter for removal of sutures 07/27/2020   Neuropathic ulcer of foot, unspecified laterality, limited to breakdown of skin ([place]) 06/09/2020   Anemia 01/16/2020   Headache, unspecified 12/03/2019   Other disorders of phosphorus metabolism 10/25/2019   Unspecified protein-calorie malnutrition ([place]) 09/27/2019   FUO (fever of unknown origin) 09/17/2019   Cerebral thrombosis with cerebral infarction 08/02/2019   Cerebral embolism with cerebral infarction 08/02/2019   Acute respiratory failure due to COVID-19 ([place]) 07/30/2019   Acute respiratory failure with hypoxia ([place]) 07/30/2019   Allergy,  unspecified, initial encounter 81/08/7508   Chronic systolic HF (heart failure) ([place]) 02/25/2019   ESRD (end stage renal disease) on dialysis ([place]) 02/25/2019   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 kidney disease, or end stage renal disease ([place]) 12/04/2018   Hypoglycemia, unspecified 08/17/2018   Generalized abdominal pain 07/16/2018   Hypercalcemia 06/28/2018   Arteriovenous fistula for hemodialysis in place, primary ([place]) 05/15/2018   Failed kidney transplant 05/15/2018   Anemia in chronic kidney disease 05/13/2018   Idiopathic gout, unspecified site 25/85/2778   Complication of vascular dialysis catheter 01/16/2018   Other specified coagulation defects ([place]) 01/16/2018   Pain, unspecified 01/16/2018   Pruritus, unspecified 01/16/2018   Secondary hyperparathyroidism of renal origin ([place]) 01/16/2018   Shortness of breath 01/16/2018   Type 2 diabetes mellitus with  diabetic peripheral angiopathy without gangrene ([place]) 01/16/2018   Abnormal gait 01/10/2018   Dependence on renal dialysis ([place]) 01/10/2018   Iron deficiency anemia, unspecified 12/21/2017   Acute blood loss anemia 12/14/2017   Hypomagnesemia 12/03/2017   Anticoagulated 12/02/2017   Stroke ([place]) 11/29/2017   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 11/28/2017   History of MI (myocardial infarction) 11/28/2017   Hypertension 11/28/2017   Chronic hepatitis C without hepatic coma ([place]) 09/25/2017   Diabetes mellitus type 2, uncomplicated ([name]) 24/23/5361   Myocardial infarction ([place]) 09/25/2017   Renal transplant recipient 09/25/2017   Transfusion history 09/25/2017   Vascular dementia without behavioral disturbance ([place]) 03/10/2017   History of coronary artery disease 09/12/2016   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 MI ([place]) 09/12/2016   Acute coronary thrombosis not resulting in myocardial infarction ([place]) 09/12/2016   Aftercare following organ transplant 09/11/2016   Mineral metabolism disorder 09/11/2016   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 MRN: [id number] Date of Birth: 06-29-1951

## 2021-05-04 NOTE — Patient Instructions (Signed)
To clear any residue - Take a bite or two then take a sip  May need to place the cup in front of him to help remind him  I would recommend always supervising him while he eats and drinks  Go ahead and cut/break all food into small parts, use a smaller spoon, limit amount of food in front of him   Monitor for belching, burping, or excessive throat clears - discuss with PCP about acid reflux

## 2021-05-06 LAB — DEMENTIA PANEL
Homocysteine: 32.8 umol/L — ABNORMAL HIGH (ref 0.0–17.2)
RPR Ser Ql: NONREACTIVE
TSH: 0.488 u[IU]/mL (ref 0.450–4.500)
Vitamin B-12: 1326 pg/mL — ABNORMAL HIGH (ref 232–1245)

## 2021-05-11 ENCOUNTER — Telehealth: Payer: Self-pay | Admitting: *Deleted

## 2021-05-11 ENCOUNTER — Other Ambulatory Visit: Payer: Self-pay

## 2021-05-11 ENCOUNTER — Encounter: Payer: Self-pay | Admitting: Rehabilitation

## 2021-05-11 ENCOUNTER — Ambulatory Visit (INDEPENDENT_AMBULATORY_CARE_PROVIDER_SITE_OTHER): Payer: Medicare Other | Admitting: Neurology

## 2021-05-11 ENCOUNTER — Encounter: Payer: Self-pay | Admitting: Neurology

## 2021-05-11 ENCOUNTER — Ambulatory Visit (INDEPENDENT_AMBULATORY_CARE_PROVIDER_SITE_OTHER): Payer: Medicare Other | Admitting: Rehabilitation

## 2021-05-11 VITALS — BP 140/79 | HR 92 | Ht 69.0 in | Wt 160.0 lb

## 2021-05-11 DIAGNOSIS — F01A0 Vascular dementia, mild, without behavioral disturbance, psychotic disturbance, mood disturbance, and anxiety: Secondary | ICD-10-CM

## 2021-05-11 DIAGNOSIS — M6281 Muscle weakness (generalized): Secondary | ICD-10-CM | POA: Diagnosis not present

## 2021-05-11 DIAGNOSIS — R413 Other amnesia: Secondary | ICD-10-CM | POA: Diagnosis not present

## 2021-05-11 DIAGNOSIS — R293 Abnormal posture: Secondary | ICD-10-CM | POA: Diagnosis not present

## 2021-05-11 DIAGNOSIS — R2689 Other abnormalities of gait and mobility: Secondary | ICD-10-CM

## 2021-05-11 DIAGNOSIS — R2681 Unsteadiness on feet: Secondary | ICD-10-CM | POA: Diagnosis not present

## 2021-05-11 DIAGNOSIS — Z8673 Personal history of transient ischemic attack (TIA), and cerebral infarction without residual deficits: Secondary | ICD-10-CM | POA: Diagnosis not present

## 2021-05-11 MED ORDER — FOLIC ACID 1 MG PO TABS: 1.0000 mg | ORAL_TABLET | Freq: Every day | ORAL | 3 refills | Status: AC

## 2021-05-11 MED ORDER — DONEPEZIL HCL 10 MG PO TABS: 10.0000 mg | ORAL_TABLET | Freq: Every day | ORAL | 3 refills | Status: DC

## 2021-05-11 NOTE — Telephone Encounter (Signed)
Left messages on both mobile and home numbers. Requested a call back. However, in review of his chart, he actually is coming in today for a follow up. His labs can also be discussed at this visit.

## 2021-05-11 NOTE — Progress Notes (Deleted)
[facility] [address] [place]. [place] [address] [phone]       OFFICE FOLLOW-UP NOTE  [name]. Date of Birth:  08-18-1950 Medical Record Number:  [id number]   HPI:  ROS:   34 system review of systems is positive for ***  PMH:  Past Medical History:  Diagnosis Date   CAD (coronary artery disease)    CHF (congestive heart failure) ([name])    Diabetes mellitus without complication ([name])    type 2   DM (diabetes mellitus) ([place])    ESRD (end stage renal disease) ([name])    FUO (fever of unknown origin) 09/17/2019   Hyperlipidemia    Hypertension    Osteomyelitis ([place])    Peripheral arterial disease ([name])    PVD (peripheral vascular disease) ([place])    Renal disorder 2015   right kidney transplant   Stroke ([place])    Vascular dementia ([place])     Social History:  Social History   Socioeconomic History   Marital status: Married    Spouse name: [name]   Number of children: Not on file   Years of education: Not on file   Highest education level: Not on file  Occupational History   Not on file  Tobacco Use   Smoking status: Former   Smokeless tobacco: Never  Scientific laboratory technician Use: Never used  Substance and Sexual Activity   Alcohol use: Never   Drug use: Never   Sexual activity: Not on file  Other Topics Concern   Not on file  Social History Narrative   Lives with wife, son, daughter in Sports coach and grandson   Right Handed   Drinks 1 cup caffeine daily   Social Determinants of Health   Financial Resource Strain: Not on file  Food Insecurity: Not on file  Transportation Needs: Not on file  Physical Activity: Not on file  Stress: Not on file  Social Connections: Not on file  Intimate Partner Violence: Not on file    Medications:   Current Outpatient Medications on File Prior to Visit  Medication Sig Dispense Refill   allopurinol (ZYLOPRIM) 100 MG tablet Take 100 mg by mouth at bedtime.     apixaban (ELIQUIS) 5 MG TABS  tablet Take 5 mg by mouth 2 (two) times daily.     aspirin 81 MG chewable tablet Chew 81 mg by mouth daily.      atorvastatin (LIPITOR) 40 MG tablet Take 40 mg by mouth daily.     b complex-vitamin c-folic acid (NEPHRO-VITE) 0.8 MG TABS tablet Take 1 tablet by mouth daily.     feeding supplement (BOOST HIGH PROTEIN) LIQD Take 1 Container by mouth daily as needed (nutrition).     insulin aspart (NOVOLOG) 100 UNIT/ML injection Insulin sliding scale: Blood sugar  120-150   3units                       151-200   4units                       201-250   7units                       251- 300  11units                       301-350   15uints  351-400   20units                       >400         call MD immediately 10 mL 0   Insulin Pen Needle 32G X 4 MM MISC 1 each by Other route as needed (insulin).      levalbuterol (XOPENEX HFA) 45 MCG/ACT inhaler Inhale 2 puffs into the lungs every 8 (eight) hours as needed for wheezing. 1 Inhaler 12   lidocaine-prilocaine (EMLA) cream Apply 1 application topically 3 (three) times a week. 30 minutes prior to Dialysis - MWF  11   oxyCODONE-acetaminophen (PERCOCET/ROXICET) 5-325 MG tablet Take 1 tablet by mouth every 6 (six) hours as needed for moderate pain. 5 tablet 0   predniSONE (DELTASONE) 5 MG tablet Take 5 mg by mouth daily with breakfast.     senna-docusate (SENOKOT-S) 8.6-50 MG tablet Take 1 tablet by mouth 2 (two) times daily. Hold if diarrhea     sevelamer carbonate (RENVELA) 800 MG tablet Take 800 mg by mouth 3 (three) times daily with meals.     No current facility-administered medications on file prior to visit.    Allergies:  No Known Allergies  Physical Exam General: well developed, well nourished, seated, in no evident distress Head: head normocephalic and atraumatic.  Neck: supple with no carotid or supraclavicular bruits Cardiovascular: regular rate and rhythm, no murmurs Musculoskeletal: no deformity Skin:  no  rash/petichiae Vascular:  Normal pulses all extremities Vitals:   05/11/21 1303  BP: 140/79  Pulse: 92   Neurologic Exam Mental Status: Awake and fully alert. Oriented to place and time. Recent and remote memory intact. Attention span, concentration and fund of knowledge appropriate. Mood and affect appropriate.  Cranial Nerves: Fundoscopic exam reveals sharp disc margins. Pupils equal, briskly reactive to light. Extraocular movements full without nystagmus. Visual fields full to confrontation. Hearing intact. Facial sensation intact. Face, tongue, palate moves normally and symmetrically.  Motor: Normal bulk and tone. Normal strength in all tested extremity muscles. Sensory.: intact to touch ,pinprick .position and vibratory sensation.  Coordination: Rapid alternating movements normal in all extremities. Finger-to-nose and heel-to-shin performed accurately bilaterally. Gait and Station: Arises from chair without difficulty. Stance is normal. Gait demonstrates normal stride length and balance . Able to heel, toe and tandem walk without difficulty.  Reflexes: 1+ and symmetric. Toes downgoing.   NIHSS  *** Modified [place]  ***   ASSESSMENT:     PLAN:  Greater than 50% of time during this 25 minute visit was spent on counseling,explanation of diagnosis, planning of further management, discussion with patient and family and coordination of care  Note: This document was prepared with digital dictation and possible smart phrase technology. Any transcriptional errors that result from this process are unintentional

## 2021-05-11 NOTE — Telephone Encounter (Signed)
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  Recommend he quit smoking in case he does so and also start taking folic acid 1 mg daily to bring this down.  We will need to repeat homocystine level in 2 months after starting treatment with folic acid ----- Message ----- From: Interface, [facility] Lab Results In Sent: 05/05/2021   7:38 AM EDT To: Garvin Fila, MD

## 2021-05-11 NOTE — Patient Instructions (Signed)
I had a long discussion with patient and his wife regarding his memory loss and cognitive impairment which likely represents a vascular dementia and recommend further evaluation with checking MRI scan to look for new strokes as well as EEG for seizures.  We discussed results of lab work for reversible causes of cognitive impairment and recommend he start taking folic acid 1 mg daily for his elevated homocystine.  Also trial of Aricept for his dementia.  Continue Eliquis for stroke prevention with aggressive risk factor modification with strict control of hypertension with blood pressure goal below 130/90, diabetes with hemoglobin A1c goal below 6.5% and lipids with LDL cholesterol goal below 70 mg percent.  He was also counseled to use his walker at all times and continue ongoing physical therapy and discussed fall prevention precautions as well.  He will return for follow-up in 3 months or call earlier if necessary.

## 2021-05-11 NOTE — Progress Notes (Signed)
[facility] [address] [place] [address] [phone]       OFFICE FOLLOW UP VISIT  NOTE  [name]. Date of Birth:  July 14, 1951 Medical Record Number:  [id number]   Referring MD: Florencia Reasons  Reason for Referral: Dementia  HPI: Initial visit 02/02/2021 :[name] is a 70 year old African-American male with past medical history of diabetes, hypertension, renal failure s/p kidney transplant on dialysis, strokes in December 2020, right below-knee amputation.  He is seen today for initial office consultation visit for dementia.  Is accompanied by his wife.  History is obtained from them and review of electronic medical records and reviewed pertinent available imaging films in PACS.  Patient has had memory and cognitive difficulties ever since his strokes in December 2020.  Over the last couple of years the wife feels he is difficulties may be progressing.  He has trouble remembering new information and needs constant reminders.  He can remember things in the past better.  He is able to feed himself but needs help with medications.  He had osteomyelitis in his right foot requiring amputation and prolonged hospitalization from March 25 to April 1 this year.  He has had not been able to ambulate since then and spends most of the time in the wheelchair.  He has not yet been fitted with a prosthesis.  He denies any headache, seizures, new stroke or TIA symptoms.  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  His blood pressure is under good control today it is 137/86.  He is tolerating Lipitor well without muscle aches and pains. Update 05/11/2021: He returns for follow-up after last visit 3 months ago.  He is accompanied by his wife Jonathan [name].  Patient continues to have memory and cognitive difficulties which  are unchanged.  He had lab work done at last visit for reversible causes of memory loss which was normal except for elevated homocystine of 32.4.  He for unclear reason has not had an MRI or an EEG.  Patient is able to ambulate with his prosthetic foot and has not had any falls.  He does use a walker.  He was seen in the ER on 03/09/2021 for bleeding from his AV fistula site.  He is back on Eliquis and tolerating it well now.  He has not had any behavioral agitation, delusions, hallucinations or unsafe behavior. ROS:   14 system review of systems is positive for memory loss, confusion, weakness, difficulty walking, foot ulcer, leg amputation all other systems negative  PMH:  Past Medical History:  Diagnosis Date   CAD (coronary artery disease)    CHF (congestive heart failure) ([name])    Diabetes mellitus without complication ([place])    type 2   DM (diabetes mellitus) ([place])    ESRD (end stage renal disease) ([place])    FUO (fever of unknown origin) 09/17/2019   Hyperlipidemia    Hypertension    Osteomyelitis ([place])    Peripheral arterial disease ([place])    PVD (peripheral vascular disease) ([place])    Renal disorder 2015   right kidney transplant   Stroke [facility])    Vascular dementia ([place])     Social History:  Social History   Socioeconomic History   Marital status: Married    Spouse name: Jonathan [name]   Number of children: Not on  file   Years of education: Not on file   Highest education level: Not on file  Occupational History   Not on file  Tobacco Use   Smoking status: Former   Smokeless tobacco: Never  Vaping Use   Vaping Use: Never used  Substance and Sexual Activity   Alcohol use: Never   Drug use: Never   Sexual activity: Not on file  Other Topics Concern   Not on file  Social History Narrative   Lives with wife, son, daughter in law and grandson   Right Handed   Drinks 1 cup caffeine daily   Social Determinants of Health   Financial Resource Strain: Not on file  Food  Insecurity: Not on file  Transportation Needs: Not on file  Physical Activity: Not on file  Stress: Not on file  Social Connections: Not on file  Intimate Partner Violence: Not on file    Medications:   Current Outpatient Medications on File Prior to Visit  Medication Sig Dispense Refill   allopurinol (ZYLOPRIM) 100 MG tablet Take 100 mg by mouth at bedtime.     apixaban (ELIQUIS) 5 MG TABS tablet Take 5 mg by mouth 2 (two) times daily.     aspirin 81 MG chewable tablet Chew 81 mg by mouth daily.      atorvastatin (LIPITOR) 40 MG tablet Take 40 mg by mouth daily.     b complex-vitamin c-folic acid (NEPHRO-VITE) 0.8 MG TABS tablet Take 1 tablet by mouth daily.     feeding supplement (BOOST HIGH PROTEIN) LIQD Take 1 Container by mouth daily as needed (nutrition).     insulin aspart (NOVOLOG) 100 UNIT/ML injection Insulin sliding scale: Blood sugar  120-150   3units                       151-200   4units                       201-250   7units                       251- 300  11units                       301-350   15uints                       351-400   20units                       >400         call MD immediately 10 mL 0   Insulin Pen Needle 32G X 4 MM MISC 1 each by Other route as needed (insulin).      levalbuterol (XOPENEX HFA) 45 MCG/ACT inhaler Inhale 2 puffs into the lungs every 8 (eight) hours as needed for wheezing. 1 Inhaler 12   lidocaine-prilocaine (EMLA) cream Apply 1 application topically 3 (three) times a week. 30 minutes prior to Dialysis - MWF  11   oxyCODONE-acetaminophen (PERCOCET/ROXICET) 5-325 MG tablet Take 1 tablet by mouth every 6 (six) hours as needed for moderate pain. 5 tablet 0   predniSONE (DELTASONE) 5 MG tablet Take 5 mg by mouth daily with breakfast.     senna-docusate (SENOKOT-S) 8.6-50 MG tablet Take 1 tablet by mouth 2 (two) times daily. Hold if diarrhea     sevelamer carbonate (RENVELA) 800  MG tablet Take 800 mg by mouth 3 (three) times daily with  meals.     No current facility-administered medications on file prior to visit.    Allergies:  No Known Allergies  Physical Exam General: Frail middle-aged African-American male not in distress., seated, in no evident distress Head: head normocephalic and atraumatic.   Neck: supple with no carotid or supraclavicular bruits Cardiovascular: regular rate and rhythm, soft ejection systolic murmur heard throughout. Musculoskeletal: no deformity has right below-knee amputation Skin:  no rash/petichiae Vascular:  Normal pulses all extremities dialysis fistula right arm.  Neurologic Exam Mental Status: Awake and fully alert. Oriented to place and time. Recent and remote memory i diminished.  Recall 0/3.  Attention span, concentration and fund of diminished appropriate. Mood and affect appropriate.  Mini-Mental status exam score 11/30 with deficits in orientation, attention, calculation, recall, reading and writing.  Able to name only 5 animals which can walk on 4 legs.  Unable to draw clock Cranial Nerves: Fundoscopic exam not done. Pupils equal, briskly reactive to light. Extraocular movements full without nystagmus. Visual fields full to confrontation. Hearing intact. Facial sensation intact.  Mild left lower facial asymmetry., tongue, palate moves normally and symmetrically.  Motor: Normal bulk and tone.  Mild weakness of left grip and intrinsic hand muscles.  Orbits right over left upper extremity.  Mild weakness of left hip flexors and ankle dorsiflexors.  Right leg amputated below knee Sensory.: intact to touch , pinprick , position and vibratory sensation.  Coordination: Rapid alternating movements normal in all extremities. Finger-to-nose and heel-to-shin performed accurately bilaterally. Gait and Station: Arises from chair without difficulty. Stance is normal. Gait demonstrates normal stride length and balance .  Reflexes: 1+ and symmetric. Toes downgoing.   NIHSS  3 Modified [place]   4  MMSE - Mini Mental State Exam 05/11/2021 02/02/2021  Orientation to time 3 0  Orientation to Place 1 1  Registration 3 3  Attention/ Calculation 0 0  Recall 0 0  Language- name 2 objects 2 2  Language- repeat 1 1  Language- follow 3 step command 1 3  Language- read & follow direction 0 0  Write a sentence 0 0  Copy design 0 0  Total score 11 10     ASSESSMENT: 70 year old male with longstanding progressive memory and cognitive impairment following strokes in December 2020 likely from vascular dementia.  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  Vascular risk factors of diabetes, hyperlipidemia, hypertension , hyper homocystinemia cardiomyopathy and CHF     PLAN: I had a long discussion with patient and his wife regarding his memory loss and cognitive impairment which likely represents a vascular dementia and recommend further evaluation with checking MRI scan to look for new strokes as well as EEG for seizures.  We discussed results of lab work for reversible causes of cognitive impairment and recommend he start taking folic acid 1 mg daily for his elevated homocystine.  Also trial of Aricept for his dementia.  Continue Eliquis for stroke prevention with aggressive risk factor modification with strict control of hypertension with blood pressure goal below 130/90, diabetes with hemoglobin A1c goal below 6.5% and lipids with LDL cholesterol goal below 70 mg percent.  He was also counseled to use his walker at all times and continue ongoing physical therapy and discussed fall prevention precautions as well.  He will return for follow-up in 3 months or call earlier if necessary.I have spent a total  of  35  minutes with the patient reviewing hospital notes,  test results, labs and examining the patient as well as establishing an assessment and plan that was discussed personally with the patient.  > 50% of time was spent in direct patient care.       Antony Contras, MD Note: This document was prepared with digital dictation and possible smart phrase technology. Any transcriptional errors that result from this process are unintentional.

## 2021-05-11 NOTE — Therapy (Signed)
[facility] Physical Therapy [address] [place], [place], [address] Phone: [phone]   Fax:  [phone]  Physical Therapy Treatment  Patient Details  Name: Jonathan BALEY Sr. MRN: [id number] Date of Birth: [birth year] Referring Provider (PT): Jamelle Haring, MD   Encounter Date: 05/11/2021   PT End of Session - 05/11/21 1246     Visit Number 12    Number of Visits 25    Date for PT Re-Evaluation 06/10/21    Authorization Type BCBS Medicare    Authorization Time Period OOP & Ded met, $0 co-pay    Progress Note Due on Visit 20    PT Start Time 1152   pt late to session   PT Stop Time 1230    PT Time Calculation (min) 38 min    Equipment Utilized During Treatment Gait belt    Activity Tolerance Patient tolerated treatment well;Other (comment)   limited by cognitive deficits / memory   Behavior During Therapy Flat affect             Past Medical History:  Diagnosis Date   CAD (coronary artery disease)    CHF (congestive heart failure) ([name])    Diabetes mellitus without complication ([place])    type 2   DM (diabetes mellitus) ([place])    ESRD (end stage renal disease) ([place])    FUO (fever of unknown origin) 09/17/2019   Hyperlipidemia    Hypertension    Osteomyelitis ([place])    Peripheral arterial disease ([name])    PVD (peripheral vascular disease) ([place])    Renal disorder 2015   right kidney transplant   Stroke [facility])    Vascular dementia [facility])     Past Surgical History:  Procedure Laterality Date   ABDOMINAL AORTOGRAM N/A 08/05/2020   Procedure: ABDOMINAL AORTOGRAM;  Surgeon: [name], MD;  Location: [place] [facility];  Service: Cardiovascular;  Laterality: N/A;   ABDOMINAL AORTOGRAM W/LOWER EXTREMITY N/A 09/09/2020   Procedure: ABDOMINAL AORTOGRAM W/LOWER EXTREMITY;  Surgeon: [name], MD;  Location: [place] [facility];  Service: Cardiovascular;  Laterality: N/A;   AMPUTATION Right 08/06/2020   Procedure: Right Fifth Toe Ray  Amputation;  Surgeon: [name], MD;  Location: [place];  Service: Vascular;  Laterality: Right;   AMPUTATION Right 11/02/2020   Procedure: RIGHT AMPUTATION BELOW KNEE;  Surgeon: [name], MD;  Location: [place];  Service: Vascular;  Laterality: Right;   BACK SURGERY     Has had 2 back surgeries   BUBBLE STUDY  09/18/2019   Procedure: BUBBLE STUDY;  Surgeon: Buford Dresser, MD;  Location: [place];  Service: Cardiovascular;;   Depression     ENDARTERECTOMY FEMORAL Right 08/06/2020   Procedure: Right Ilio- Femoral Artery Endarterectomy;  Surgeon: [name], MD;  Location: [facility] OR;  Service: Vascular;  Laterality: Right;   HAND SURGERY Right    HAND SURGERY     IR REMOVAL TUN CV CATH W/O [facility]  09/03/2019   KIDNEY TRANSPLANT     November 2015   LEG AMPUTATION BELOW KNEE Right    LOWER EXTREMITY ANGIOGRAPHY Bilateral 08/05/2020   Procedure: LOWER EXTREMITY ANGIOGRAPHY;  Surgeon: [name], MD;  Location: [place] [facility];  Service: Cardiovascular;  Laterality: Bilateral;   Memory loss     NEPHRECTOMY TRANSPLANTED ORGAN     PATCH ANGIOPLASTY Right 08/06/2020   Procedure: PATCH ANGIOPLASTY;  Surgeon: [name], MD;  Location: [place];  Service: Vascular;  Laterality: Right;  PERIPHERAL VASCULAR BALLOON ANGIOPLASTY Right 09/09/2020   Procedure: PERIPHERAL VASCULAR BALLOON ANGIOPLASTY;  Surgeon: [name], MD;  Location: [place] [facility];  Service: Cardiovascular;  Laterality: Right;  popliteal   PERIPHERAL VASCULAR INTERVENTION Right 08/05/2020   Procedure: PERIPHERAL VASCULAR INTERVENTION;  Surgeon: [name], MD;  Location: [place] [facility];  Service: Cardiovascular;  Laterality: Right;  SFA   PERIPHERAL VASCULAR INTERVENTION Right 09/09/2020   Procedure: PERIPHERAL VASCULAR INTERVENTION;  Surgeon: [name], MD;  Location: [place] [facility];  Service: Cardiovascular;  Laterality: Right;  external iliac   TEE WITHOUT  CARDIOVERSION N/A 09/18/2019   Procedure: TRANSESOPHAGEAL ECHOCARDIOGRAM (TEE);  Surgeon: Buford Dresser, MD;  Location: [facility] END[facility]COPY;  Service: Cardiovascular;  Laterality: N/A;   WOUND DEBRIDEMENT Right 10/08/2020   Procedure: DEBRIDEMENT WOUND RIGHT FOOT;  Surgeon: [name], MD;  Location: [place];  Service: Vascular;  Laterality: Right;    There were no vitals filed for this visit.   Subjective Assessment - 05/11/21 1200     Subjective Wife reports he is walking a little more at home.  He actually walked into a MD appt this morning with wife.    Pertinent History rt TTA, DM2, HTN, PVD, ESRD w/dialysis, hx right kidney transplant Nov 2015, Stroke, back sg X 2, memory issues    Patient Stated Goals use prosthesis to get around house & community, be able to get in & out of high truck. be able to go to bathroom without assistance.    Currently in Pain? No/denies                               [facility] Adult PT Treatment/Exercise - 05/11/21 1201       Transfers   Transfers Sit to Stand;Stand to Sit    Sit to Stand 5: Supervision    Sit to Stand Details Verbal cues for technique    Stand to Sit With upper extremity assist;To chair/3-in-1;5: Supervision    Stand to Sit Details (indicate cue type and reason) Verbal cues for technique    Comments Continued cues for hand placement and safety when sitting      Ambulation/Gait   Ambulation/Gait Yes    Ambulation/Gait Assistance 5: Supervision;4: Min guard    Ambulation/Gait Assistance Details Continue to provide verbal and tactile cues for posture and keeping RW moving for increased stride length.    Ambulation Distance (Feet) 65 Feet   x 2 reps   Assistive device Rolling walker;Prosthesis    Gait Pattern Step-to pattern;Decreased step length - left;Decreased stance time - right;Decreased stride length;Decreased hip/knee flexion - right;Decreased weight shift to right;Right flexed knee in stance;Left flexed  knee in stance;Antalgic;Lateral hip instability;Trunk flexed;Abducted- right    Ambulation Surface Level;Indoor    Ramp 4: Min assist    Ramp Details (indicate cue type and reason) Max cues for sequencing and technique.    Curb 4: Min assist    Curb Details (indicate cue type and reason) Tactile and verbal cues for technique as he wants to keep walker tipped when descending curb.      Exercises   Exercises Knee/Hip    Other Exercises  Performed back extension at wall today to see if this may be easier for pt.  He was able to do with cues but needed max tactile cues for maintaining head/shoulders towards wall.  Then had pt move arms up/down along wall for improved  scapular mobility while maintaining extension in back.      Knee/Hip Exercises: Stretches   Active Hamstring Stretch Both;1 rep;30 seconds    Active Hamstring Stretch Limitations supine with strap      Knee/Hip Exercises: Aerobic   Nustep Seat 11 level 6 with BLEs & BUEs for 8 mins.  Did reduce to level 5 at 5 mins to increase rpms.      Knee/Hip Exercises: Supine   Straight Leg Raises Strengthening;Both;1 set;10 reps    Straight Leg Raises Limitations max cues for maintaining knee extension throughout      Knee/Hip Exercises: Sidelying   Hip ABduction Strengthening;Right;1 set;15 reps    Hip ABduction Limitations Attempted straight leg in SL however this was too difficult as he would utilize hip flexors, therefore moved to clamshell in SL x 15 reps.      Prosthetics   Prosthetic Care Comments  Educated on possible length difference in prosthesis vs hamstring tightness.  He seems to stand with R knee bent at all times.  Attempted to have pt stand against wall so that PT could assess illiac crests for height difference but it was difficult to get pts feet completely under him.  His height seemed to be equal.    Current prosthetic wear tolerance (days/week)  4 of 7days per week on nondialysis days only    Current prosthetic wear  tolerance (#hours/day)  8 hrs total on nondialysis days which is most of awake hours.    Current prosthetic weight-bearing tolerance (hours/day)  Patient tolerated standing for 5 min with partial weight on prosthesis with no c/o pain or discomfort.    Residual limb condition  no skin issues or changes to limb    Education Provided --   see comments above   Person(s) Educated Patient;Spouse    Education Method Explanation;Verbal cues    Education Method Verbalized understanding                       PT Short Term Goals - 04/29/21 1451       PT SHORT TERM GOAL #1   Title Wife verbalizes how to adjust weight at dialysis when wearing prosthesis.    Baseline printed out instructions today and she verbalizes understanding    Time 4    Period Weeks    Status On-going    Target Date 05/13/21      PT SHORT TERM GOAL #2   Title Patient tolerates prosthesis >10 hrs total on nondialysis days & >4 hours if out of bed on dialysis days without skin issues or limb pain    Baseline around 8 hours on nondialysis days    Time 4    Period Weeks    Status On-going    Target Date 05/13/21      PT SHORT TERM GOAL #3   Title Patient able to sit to & from stand w/c or chair without armrests to RW with supervision.    Baseline met 9/22    Time 4    Period Weeks    Status Achieved    Target Date 05/13/21      PT SHORT TERM GOAL #4   Title Patient ambulates 150' with RW & prosthesis with supervision.    Baseline up to 70' on 9/22    Time 4    Period Weeks    Status On-going    Target Date 05/13/21      PT SHORT TERM GOAL #5  Title Patient able to negotiate ramps & curbs with RW with minA from wife safely.    Baseline was min A with PT, wife had to step out for phone call during this, she does verbalize understanding of how to perform.    Time 4    Period Weeks    Status On-going    Target Date 05/13/21               PT Long Term Goals - 04/27/21 1443       PT LONG  TERM GOAL #1   Title Patient's wife demonstrates & verbalized understanding of prosthetic care to enable safe utilization of prosthesis.    Time 12    Period Weeks    Status On-going    Target Date 06/10/21      PT LONG TERM GOAL #2   Title Patient tolerates prosthesis wear >90% of awake hours without skin or limb pain issues.    Time 12    Period Weeks    Status On-going    Target Date 06/10/21      PT LONG TERM GOAL #3   Title Patient able to perform standing ADLs like managing pants to toilet with RW & prosthesis with supervision.    Time 12    Period Weeks    Status On-going    Target Date 06/10/21      PT LONG TERM GOAL #4   Title Patient ambulates 200' with RW or rollator walker & prosthesis with wife's supervision.    Time 12    Period Weeks    Status On-going    Target Date 06/10/21      PT LONG TERM GOAL #5   Title Patient negotiates ramps & curbs with RW or rollator walker & prosthesis with minA from wife.    Time 12    Period Weeks    Status On-going    Target Date 06/10/21                   Plan - 05/11/21 1248     Clinical Impression Statement Skilled session focused on LE strength and flexibility along with gait for quality with negotiation of curb and ramp.  He continues to need max verbal cues and demo cues due to poor cognition.  Wife present during session to assist.  He did get somewhat angry at end of session as PT was questioning if he was okay due to coughing following sitting up.  Wife stepped in to assist pt back to w/c.    Personal Factors and Comorbidities Comorbidity 3+;Fitness;Time since onset of injury/illness/exacerbation    Comorbidities rt TTA, DM2, HTN, PVD, ESRD w/dialysis, hx right kidney transplant Nov 2015, Stroke, back sg X 2, memory loss    Examination-Activity Limitations Stand;Transfers;Toileting;Stairs;Locomotion Level    Examination-Participation Restrictions Community Activity;Other   household mobility & ADLs    Stability/Clinical Decision Making Evolving/Moderate complexity    Rehab Potential Good    PT Frequency 2x / week    PT Duration 12 weeks    PT Treatment/Interventions ADLs/Self Care Home Management;DME Instruction;Gait training;Stair training;Functional mobility training;Therapeutic activities;Therapeutic exercise;Balance training;Neuromuscular re-education;Patient/family education;Prosthetic Training;Vestibular    PT Next Visit Plan work on active back extension standing with posterior pelvis to counter for strength & standing balance.    work towards updated STGs, continue to work to prosthetic gait with RW & standing balance activities, Nustep & leg press if time permits, improving wear time    Consulted and Agree with  Plan of Care Patient    Family Member Consulted wife, Trever Streater             Patient will benefit from skilled therapeutic intervention in order to improve the following deficits and impairments:  Abnormal gait, Decreased activity tolerance, Decreased balance, Decreased endurance, Decreased knowledge of use of DME, Decreased mobility, Decreased strength, Increased edema, Impaired flexibility, Postural dysfunction, Prosthetic Dependency  Visit Diagnosis: Other abnormalities of gait and mobility  Abnormal posture  Unsteadiness on feet  Muscle weakness (generalized)     Problem List Patient Active Problem List   Diagnosis Date Noted   Osteomyelitis ([place]) 10/30/2020   Encounter for general adult medical examination without abnormal findings 09/15/2020   Hemiplegia of dominant side ([place]) 09/15/2020   History of cerebrovascular accident 09/15/2020   History of COVID-19 09/15/2020   Hypercoagulable state ([place]) 09/15/2020   Other chronic osteomyelitis, right ankle and foot ([place]) 09/15/2020   Right flank pain 09/15/2020   Atherosclerosis of native arteries of the extremities with ulceration ([place]) 08/05/2020   Encounter for removal of sutures 07/27/2020    Neuropathic ulcer of foot, unspecified laterality, limited to breakdown of skin ([place]) 06/09/2020   Anemia 01/16/2020   Headache, unspecified 12/03/2019   Other disorders of phosphorus metabolism 10/25/2019   Unspecified protein-calorie malnutrition ([place]) 09/27/2019   FUO (fever of unknown origin) 09/17/2019   Cerebral thrombosis with cerebral infarction 08/02/2019   Cerebral embolism with cerebral infarction 08/02/2019   Acute respiratory failure due to COVID-19 [facility]) 07/30/2019   Acute respiratory failure with hypoxia ([place]) 07/30/2019   Allergy, unspecified, initial encounter 00/76/2263   Chronic systolic HF (heart failure) ([place]) 02/25/2019   ESRD (end stage renal disease) on dialysis ([place]) 02/25/2019   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 kidney disease, or end stage renal disease ([place]) 12/04/2018   Hypoglycemia, unspecified 08/17/2018   Generalized abdominal pain 07/16/2018   Hypercalcemia 06/28/2018   Arteriovenous fistula for hemodialysis in place, primary ([place]) 05/15/2018   Failed kidney transplant 05/15/2018   Anemia in chronic kidney disease 05/13/2018   Idiopathic gout, unspecified site 33/54/5625   Complication of vascular dialysis catheter 01/16/2018   Other specified coagulation defects ([place]) 01/16/2018   Pain, unspecified 01/16/2018   Pruritus, unspecified 01/16/2018   Secondary hyperparathyroidism of renal origin ([place]) 01/16/2018   Shortness of breath 01/16/2018   Type 2 diabetes mellitus with diabetic peripheral angiopathy without gangrene ([place]) 01/16/2018   Abnormal gait 01/10/2018   Dependence on renal dialysis ([place]) 01/10/2018   Iron deficiency anemia, unspecified 12/21/2017   Acute blood loss anemia 12/14/2017   Hypomagnesemia 12/03/2017   Anticoagulated 12/02/2017   Stroke ([place]) 11/29/2017   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 11/28/2017   History of MI (myocardial infarction) 11/28/2017    Hypertension 11/28/2017   Chronic hepatitis C without hepatic coma ([place]) 09/25/2017   Diabetes mellitus type 2, uncomplicated ([name]) 63/89/3734   Myocardial infarction ([place]) 09/25/2017   Renal transplant recipient 09/25/2017   Transfusion history 09/25/2017   Vascular dementia without behavioral disturbance ([place]) 03/10/2017   History of coronary artery disease 09/12/2016   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 MI ([place]) 09/12/2016   Acute coronary thrombosis not resulting in myocardial infarction ([place]) 09/12/2016   Aftercare following organ transplant 09/11/2016   Mineral metabolism disorder 09/11/2016   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 MRN: [id number] Date of Birth: 1951-05-05

## 2021-05-18 ENCOUNTER — Ambulatory Visit (INDEPENDENT_AMBULATORY_CARE_PROVIDER_SITE_OTHER): Payer: Medicare Other | Admitting: Physical Therapy

## 2021-05-18 ENCOUNTER — Encounter: Payer: Self-pay | Admitting: Physical Therapy

## 2021-05-18 ENCOUNTER — Other Ambulatory Visit: Payer: Self-pay

## 2021-05-18 DIAGNOSIS — M6281 Muscle weakness (generalized): Secondary | ICD-10-CM | POA: Diagnosis not present

## 2021-05-18 DIAGNOSIS — R2681 Unsteadiness on feet: Secondary | ICD-10-CM

## 2021-05-18 DIAGNOSIS — R293 Abnormal posture: Secondary | ICD-10-CM

## 2021-05-18 DIAGNOSIS — R2689 Other abnormalities of gait and mobility: Secondary | ICD-10-CM

## 2021-05-18 NOTE — Therapy (Signed)
[facility] Physical Therapy [address] [place], [place], [address] Phone: [phone]   Fax:  [phone]  Physical Therapy Treatment  Patient Details  Name: Jonathan GASPARD Sr. MRN: [id number] Date of Birth: [birth year] Referring Provider (PT): Jamelle Haring, MD   Encounter Date: 05/18/2021   PT End of Session - 05/18/21 1352     Visit Number 13    Number of Visits 25    Date for PT Re-Evaluation 06/10/21    Authorization Type BCBS Medicare    Authorization Time Period OOP & Ded met, $0 co-pay    Progress Note Due on Visit 20    PT Start Time 1349    PT Stop Time 1430    PT Time Calculation (min) 41 min    Equipment Utilized During Treatment Gait belt    Activity Tolerance Patient tolerated treatment well;Other (comment)   limited by cognitive deficits / memory   Behavior During Therapy Flat affect             Past Medical History:  Diagnosis Date   CAD (coronary artery disease)    CHF (congestive heart failure) ([name])    Diabetes mellitus without complication ([place])    type 2   DM (diabetes mellitus) ([place])    ESRD (end stage renal disease) ([place])    FUO (fever of unknown origin) 09/17/2019   Hyperlipidemia    Hypertension    Osteomyelitis ([place])    Peripheral arterial disease ([name])    PVD (peripheral vascular disease) ([place])    Renal disorder 2015   right kidney transplant   Stroke [facility])    Vascular dementia [facility])     Past Surgical History:  Procedure Laterality Date   ABDOMINAL AORTOGRAM N/A 08/05/2020   Procedure: ABDOMINAL AORTOGRAM;  Surgeon: [name], MD;  Location: [place] [facility];  Service: Cardiovascular;  Laterality: N/A;   ABDOMINAL AORTOGRAM W/LOWER EXTREMITY N/A 09/09/2020   Procedure: ABDOMINAL AORTOGRAM W/LOWER EXTREMITY;  Surgeon: [name], MD;  Location: [place] [facility];  Service: Cardiovascular;  Laterality: N/A;   AMPUTATION Right 08/06/2020   Procedure: Right Fifth Toe Ray Amputation;  Surgeon:  [name], MD;  Location: [place];  Service: Vascular;  Laterality: Right;   AMPUTATION Right 11/02/2020   Procedure: RIGHT AMPUTATION BELOW KNEE;  Surgeon: [name], MD;  Location: [place];  Service: Vascular;  Laterality: Right;   BACK SURGERY     Has had 2 back surgeries   BUBBLE STUDY  09/18/2019   Procedure: BUBBLE STUDY;  Surgeon: Buford Dresser, MD;  Location: [place];  Service: Cardiovascular;;   Depression     ENDARTERECTOMY FEMORAL Right 08/06/2020   Procedure: Right Ilio- Femoral Artery Endarterectomy;  Surgeon: [name], MD;  Location: [facility] OR;  Service: Vascular;  Laterality: Right;   HAND SURGERY Right    HAND SURGERY     IR REMOVAL TUN CV CATH W/O [facility]  09/03/2019   KIDNEY TRANSPLANT     November 2015   LEG AMPUTATION BELOW KNEE Right    LOWER EXTREMITY ANGIOGRAPHY Bilateral 08/05/2020   Procedure: LOWER EXTREMITY ANGIOGRAPHY;  Surgeon: [name], MD;  Location: [place] [facility];  Service: Cardiovascular;  Laterality: Bilateral;   Memory loss     NEPHRECTOMY TRANSPLANTED ORGAN     PATCH ANGIOPLASTY Right 08/06/2020   Procedure: PATCH ANGIOPLASTY;  Surgeon: [name], MD;  Location: [place];  Service: Vascular;  Laterality: Right;   PERIPHERAL VASCULAR BALLOON  ANGIOPLASTY Right 09/09/2020   Procedure: PERIPHERAL VASCULAR BALLOON ANGIOPLASTY;  Surgeon: [name], MD;  Location: [place] [facility];  Service: Cardiovascular;  Laterality: Right;  popliteal   PERIPHERAL VASCULAR INTERVENTION Right 08/05/2020   Procedure: PERIPHERAL VASCULAR INTERVENTION;  Surgeon: [name], MD;  Location: [place] [facility];  Service: Cardiovascular;  Laterality: Right;  SFA   PERIPHERAL VASCULAR INTERVENTION Right 09/09/2020   Procedure: PERIPHERAL VASCULAR INTERVENTION;  Surgeon: [name], MD;  Location: [place] [facility];  Service: Cardiovascular;  Laterality: Right;  external iliac   TEE WITHOUT CARDIOVERSION N/A 09/18/2019    Procedure: TRANSESOPHAGEAL ECHOCARDIOGRAM (TEE);  Surgeon: Buford Dresser, MD;  Location: [facility];  Service: Cardiovascular;  Laterality: N/A;   WOUND DEBRIDEMENT Right 10/08/2020   Procedure: DEBRIDEMENT WOUND RIGHT FOOT;  Surgeon: [name], MD;  Location: [place];  Service: Vascular;  Laterality: Right;    There were no vitals filed for this visit.   Subjective Assessment - 05/18/21 1349     Subjective He reports wearing prosthesis to dialysis some times without issues.    Pertinent History rt TTA, DM2, HTN, PVD, ESRD w/dialysis, hx right kidney transplant Nov 2015, Stroke, back sg X 2, memory issues    Patient Stated Goals use prosthesis to get around house & community, be able to get in & out of high truck. be able to go to bathroom without assistance.    Currently in Pain? No/denies                               [facility] Adult PT Treatment/Exercise - 05/18/21 1349       Transfers   Transfers Sit to Stand;Stand to Sit    Sit to Stand 5: Supervision    Sit to Stand Details Verbal cues for technique    Stand to Sit With upper extremity assist;To chair/3-in-1;5: Supervision    Stand to Sit Details (indicate cue type and reason) Verbal cues for technique    Comments Continued cues for hand placement and safety when sitting      Ambulation/Gait   Ambulation/Gait Yes    Ambulation/Gait Assistance 5: Supervision;4: Min guard    Ambulation/Gait Assistance Details tactile & verbal cues on upright posture & wt shift    Ambulation Distance (Feet) 165 Feet    Assistive device Rolling walker;Prosthesis    Ramp 4: Min assist   min guard w/ RW & TTA prosthesis   Ramp Details (indicate cue type and reason) tactile & verbal cues on technique    Curb 4: Min assist   min guard w/ RW & TTA prosthesis   Curb Details (indicate cue type and reason) tactile & verbal cues on technique      Exercises   Exercises Knee/Hip      Knee/Hip Exercises: Stretches    Active Hamstring Stretch Both;1 rep;30 seconds    Active Hamstring Stretch Limitations supine with strap      Knee/Hip Exercises: Aerobic   Nustep Seat 11 level 6 with BLEs & BUEs for  level 6 for 4 mins with reduced to level 5 for 4 mins to increase rpms.      Knee/Hip Exercises: Machines for Strengthening   Total Gym Leg Press shuttle leg press BLEs 81# 10 reps 3 sets      Prosthetics   Current prosthetic wear tolerance (days/week)  daily including to dialysis    Current prosthetic wear tolerance (#  hours/day)  wife reports most of awake hours.    Current prosthetic weight-bearing tolerance (hours/day)  Patient tolerated standing for 5 min with partial weight on prosthesis with no c/o pain or discomfort.    Residual limb condition  no skin issues or changes to limb    Education Provided --   see comments above                      PT Short Term Goals - 05/18/21 1422       PT SHORT TERM GOAL #1   Title Wife verbalizes how to adjust weight at dialysis when wearing prosthesis.    Baseline printed out instructions today and she verbalizes understanding    Time 4    Period Weeks    Status Achieved    Target Date 05/13/21      PT SHORT TERM GOAL #2   Title Patient tolerates prosthesis >10 hrs total on nondialysis days & >4 hours if out of bed on dialysis days without skin issues or limb pain    Baseline around 8 hours on nondialysis days    Time 4    Period Weeks    Status Achieved    Target Date 05/13/21      PT SHORT TERM GOAL #3   Title Patient able to sit to & from stand w/c or chair without armrests to RW with supervision.    Baseline met 9/22    Time 4    Period Weeks    Status Achieved    Target Date 05/13/21      PT SHORT TERM GOAL #4   Title Patient ambulates 150' with RW & prosthesis with supervision.    Baseline up to 70' on 9/22    Time 4    Period Weeks    Status Achieved    Target Date 05/13/21      PT SHORT TERM GOAL #5   Title Patient able  to negotiate ramps & curbs with RW with minA from wife safely.    Baseline Min guard with PT, wife did not attend session    Time 4    Period Weeks    Status Partially Met    Target Date 05/13/21               PT Long Term Goals - 04/27/21 1443       PT LONG TERM GOAL #1   Title Patient's wife demonstrates & verbalized understanding of prosthetic care to enable safe utilization of prosthesis.    Time 12    Period Weeks    Status On-going    Target Date 06/10/21      PT LONG TERM GOAL #2   Title Patient tolerates prosthesis wear >90% of awake hours without skin or limb pain issues.    Time 12    Period Weeks    Status On-going    Target Date 06/10/21      PT LONG TERM GOAL #3   Title Patient able to perform standing ADLs like managing pants to toilet with RW & prosthesis with supervision.    Time 12    Period Weeks    Status On-going    Target Date 06/10/21      PT LONG TERM GOAL #4   Title Patient ambulates 200' with RW or rollator walker & prosthesis with wife's supervision.    Time 12    Period Weeks    Status On-going  Target Date 06/10/21      PT LONG TERM GOAL #5   Title Patient negotiates ramps & curbs with RW or rollator walker & prosthesis with minA from wife.    Time 12    Period Weeks    Status On-going    Target Date 06/10/21                   Plan - 05/18/21 1352     Clinical Impression Statement Patient fatigues quickly which limits standing tolerance including gait.  He gets easily flustrated when fatigued.    Personal Factors and Comorbidities Comorbidity 3+;Fitness;Time since onset of injury/illness/exacerbation    Comorbidities rt TTA, DM2, HTN, PVD, ESRD w/dialysis, hx right kidney transplant Nov 2015, Stroke, back sg X 2, memory loss    Examination-Activity Limitations Stand;Transfers;Toileting;Stairs;Locomotion Level    Examination-Participation Restrictions Community Activity;Other   household mobility & ADLs    Stability/Clinical Decision Making Evolving/Moderate complexity    Rehab Potential Good    PT Frequency 2x / week    PT Duration 12 weeks    PT Treatment/Interventions ADLs/Self Care Home Management;DME Instruction;Gait training;Stair training;Functional mobility training;Therapeutic activities;Therapeutic exercise;Balance training;Neuromuscular re-education;Patient/family education;Prosthetic Training;Vestibular    PT Next Visit Plan standing balance.    work towards updated LTGs, continue to work to prosthetic gait with RW & standing balance activities, Nustep & leg press if time permits, improving wear time    Consulted and Agree with Plan of Care Patient    Family Member Consulted wife, Sven Pinheiro             Patient will benefit from skilled therapeutic intervention in order to improve the following deficits and impairments:  Abnormal gait, Decreased activity tolerance, Decreased balance, Decreased endurance, Decreased knowledge of use of DME, Decreased mobility, Decreased strength, Increased edema, Impaired flexibility, Postural dysfunction, Prosthetic Dependency  Visit Diagnosis: Other abnormalities of gait and mobility  Abnormal posture  Unsteadiness on feet  Muscle weakness (generalized)     Problem List Patient Active Problem List   Diagnosis Date Noted   Osteomyelitis ([place]) 10/30/2020   Encounter for general adult medical examination without abnormal findings 09/15/2020   Hemiplegia of dominant side ([place]) 09/15/2020   History of cerebrovascular accident 09/15/2020   History of COVID-19 09/15/2020   Hypercoagulable state ([place]) 09/15/2020   Other chronic osteomyelitis, right ankle and foot ([place]) 09/15/2020   Right flank pain 09/15/2020   Atherosclerosis of native arteries of the extremities with ulceration ([place]) 08/05/2020   Encounter for removal of sutures 07/27/2020   Neuropathic ulcer of foot, unspecified laterality, limited to breakdown of skin ([place])  06/09/2020   Anemia 01/16/2020   Headache, unspecified 12/03/2019   Other disorders of phosphorus metabolism 10/25/2019   Unspecified protein-calorie malnutrition ([place]) 09/27/2019   FUO (fever of unknown origin) 09/17/2019   Cerebral thrombosis with cerebral infarction 08/02/2019   Cerebral embolism with cerebral infarction 08/02/2019   Acute respiratory failure due to COVID-19 ([place]) 07/30/2019   Acute respiratory failure with hypoxia ([place]) 07/30/2019   Allergy, unspecified, initial encounter 29/51/8841   Chronic systolic HF (heart failure) ([place]) 02/25/2019   ESRD (end stage renal disease) on dialysis ([place]) 02/25/2019   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 kidney disease, or end stage renal disease ([place]) 12/04/2018   Hypoglycemia, unspecified 08/17/2018   Generalized abdominal pain 07/16/2018   Hypercalcemia 06/28/2018   Arteriovenous fistula for hemodialysis in place, primary ([place]) 05/15/2018   Failed kidney  transplant 05/15/2018   Anemia in chronic kidney disease 05/13/2018   Idiopathic gout, unspecified site 41/63/8453   Complication of vascular dialysis catheter 01/16/2018   Other specified coagulation defects ([place]) 01/16/2018   Pain, unspecified 01/16/2018   Pruritus, unspecified 01/16/2018   Secondary hyperparathyroidism of renal origin ([place]) 01/16/2018   Shortness of breath 01/16/2018   Type 2 diabetes mellitus with diabetic peripheral angiopathy without gangrene ([place]) 01/16/2018   Abnormal gait 01/10/2018   Dependence on renal dialysis ([place]) 01/10/2018   Iron deficiency anemia, unspecified 12/21/2017   Acute blood loss anemia 12/14/2017   Hypomagnesemia 12/03/2017   Anticoagulated 12/02/2017   Stroke ([place]) 11/29/2017   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 11/28/2017   History of MI (myocardial infarction) 11/28/2017   Hypertension 11/28/2017   Chronic hepatitis C without hepatic coma ([place])  09/25/2017   Diabetes mellitus type 2, uncomplicated ([place]) 64/68/0321   Myocardial infarction ([place]) 09/25/2017   Renal transplant recipient 09/25/2017   Transfusion history 09/25/2017   Vascular dementia without behavioral disturbance ([place]) 03/10/2017   History of coronary artery disease 09/12/2016   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 MI ([place]) 09/12/2016   Acute coronary thrombosis not resulting in myocardial infarction ([place]) 09/12/2016   Aftercare following organ transplant 09/11/2016   Mineral metabolism disorder 09/11/2016   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 MRN: [id number] Date of Birth: 1951-03-07

## 2021-05-20 ENCOUNTER — Encounter: Payer: Medicare Other | Admitting: Physical Therapy

## 2021-05-25 ENCOUNTER — Ambulatory Visit (INDEPENDENT_AMBULATORY_CARE_PROVIDER_SITE_OTHER): Payer: Medicare Other | Admitting: Physical Therapy

## 2021-05-25 ENCOUNTER — Encounter: Payer: Self-pay | Admitting: Physical Therapy

## 2021-05-25 ENCOUNTER — Other Ambulatory Visit: Payer: Self-pay

## 2021-05-25 DIAGNOSIS — R2689 Other abnormalities of gait and mobility: Secondary | ICD-10-CM | POA: Diagnosis not present

## 2021-05-25 DIAGNOSIS — R293 Abnormal posture: Secondary | ICD-10-CM

## 2021-05-25 DIAGNOSIS — M6281 Muscle weakness (generalized): Secondary | ICD-10-CM

## 2021-05-25 DIAGNOSIS — R2681 Unsteadiness on feet: Secondary | ICD-10-CM

## 2021-05-25 NOTE — Therapy (Signed)
[facility] [address] [place], [place], [address] Phone: [phone]   Fax:  [phone]  Physical Therapy Treatment  Patient Details  Name: Jonathan DENIO Sr. MRN: [id number] Date of Birth: 1951-05-01 Referring Provider (PT): Jamelle Haring, MD   Encounter Date: 05/25/2021   PT End of Session - 05/25/21 1353     Visit Number 14    Number of Visits 25    Date for PT Re-Evaluation 06/10/21    Authorization Type BCBS Medicare    Authorization Time Period OOP & Ded met, $0 co-pay    Progress Note Due on Visit 20    PT Start Time 1346    PT Stop Time 1430    PT Time Calculation (min) 44 min    Equipment Utilized During Treatment Gait belt    Activity Tolerance Patient tolerated treatment well;Other (comment)   limited by cognitive deficits / memory   Behavior During Therapy Flat affect             Past Medical History:  Diagnosis Date   CAD (coronary artery disease)    CHF (congestive heart failure) ([name])    Diabetes mellitus without complication ([place])    type 2   DM (diabetes mellitus) ([place])    ESRD (end stage renal disease) ([place])    FUO (fever of unknown origin) 09/17/2019   Hyperlipidemia    Hypertension    Osteomyelitis ([place])    Peripheral arterial disease ([name])    PVD (peripheral vascular disease) ([place])    Renal disorder 2015   right kidney transplant   Stroke [facility])    Vascular dementia [facility])     Past Surgical History:  Procedure Laterality Date   ABDOMINAL AORTOGRAM N/A 08/05/2020   Procedure: ABDOMINAL AORTOGRAM;  Surgeon: [name], MD;  Location: [place] [facility];  Service: Cardiovascular;  Laterality: N/A;   ABDOMINAL AORTOGRAM W/LOWER EXTREMITY N/A 09/09/2020   Procedure: ABDOMINAL AORTOGRAM W/LOWER EXTREMITY;  Surgeon: [name], MD;  Location: [place] [facility];  Service: Cardiovascular;  Laterality: N/A;   AMPUTATION Right 08/06/2020   Procedure: Right Fifth Toe Ray Amputation;  Surgeon:  [name], MD;  Location: [place];  Service: Vascular;  Laterality: Right;   AMPUTATION Right 11/02/2020   Procedure: RIGHT AMPUTATION BELOW KNEE;  Surgeon: [name], MD;  Location: [place];  Service: Vascular;  Laterality: Right;   BACK SURGERY     Has had 2 back surgeries   BUBBLE STUDY  09/18/2019   Procedure: BUBBLE STUDY;  Surgeon: Buford Dresser, MD;  Location: [place];  Service: Cardiovascular;;   Depression     ENDARTERECTOMY FEMORAL Right 08/06/2020   Procedure: Right Ilio- Femoral Artery Endarterectomy;  Surgeon: [name], MD;  Location: [facility] OR;  Service: Vascular;  Laterality: Right;   HAND SURGERY Right    HAND SURGERY     IR REMOVAL TUN CV CATH W/O [facility]  09/03/2019   KIDNEY TRANSPLANT     November 2015   LEG AMPUTATION BELOW KNEE Right    LOWER EXTREMITY ANGIOGRAPHY Bilateral 08/05/2020   Procedure: LOWER EXTREMITY ANGIOGRAPHY;  Surgeon: [name], MD;  Location: [place] [facility];  Service: Cardiovascular;  Laterality: Bilateral;   Memory loss     NEPHRECTOMY TRANSPLANTED ORGAN     PATCH ANGIOPLASTY Right 08/06/2020   Procedure: PATCH ANGIOPLASTY;  Surgeon: [name], MD;  Location: [place];  Service: Vascular;  Laterality: Right;   PERIPHERAL VASCULAR BALLOON  ANGIOPLASTY Right 09/09/2020   Procedure: PERIPHERAL VASCULAR BALLOON ANGIOPLASTY;  Surgeon: [name], MD;  Location: [place] [facility];  Service: Cardiovascular;  Laterality: Right;  popliteal   PERIPHERAL VASCULAR INTERVENTION Right 08/05/2020   Procedure: PERIPHERAL VASCULAR INTERVENTION;  Surgeon: [name], MD;  Location: [place] [facility];  Service: Cardiovascular;  Laterality: Right;  SFA   PERIPHERAL VASCULAR INTERVENTION Right 09/09/2020   Procedure: PERIPHERAL VASCULAR INTERVENTION;  Surgeon: [name], MD;  Location: [place] [facility];  Service: Cardiovascular;  Laterality: Right;  external iliac   TEE WITHOUT CARDIOVERSION N/A 09/18/2019    Procedure: TRANSESOPHAGEAL ECHOCARDIOGRAM (TEE);  Surgeon: Buford Dresser, MD;  Location: [facility] ENDOSCOPY;  Service: Cardiovascular;  Laterality: N/A;   WOUND DEBRIDEMENT Right 10/08/2020   Procedure: DEBRIDEMENT WOUND RIGHT FOOT;  Surgeon: [name], MD;  Location: [place];  Service: Vascular;  Laterality: Right;    There were no vitals filed for this visit.   Subjective Assessment - 05/25/21 1346     Subjective He walked into PT with RW today for first time.  He reports that it was his wife's idea.    Pertinent History rt TTA, DM2, HTN, PVD, ESRD w/dialysis, hx right kidney transplant Nov 2015, Stroke, back sg X 2, memory issues    Patient Stated Goals use prosthesis to get around house & community, be able to get in & out of high truck. be able to go to bathroom without assistance.    Currently in Pain? No/denies                               [facility] Adult PT Treatment/Exercise - 05/25/21 1346       Transfers   Transfers Sit to Stand;Stand to Sit    Sit to Stand 5: Supervision    Sit to Stand Details Verbal cues for technique    Stand to Sit With upper extremity assist;To chair/3-in-1;5: Supervision    Stand to Sit Details (indicate cue type and reason) Verbal cues for technique    Comments Continued cues for hand placement and safety when sitting      Ambulation/Gait   Ambulation/Gait Yes    Ambulation/Gait Assistance 5: Supervision    Ambulation/Gait Assistance Details tactile & verbal cues for upright posture    Ambulation Distance (Feet) 120 Feet   120' X 2   Assistive device Rolling walker;Prosthesis    Ramp 4: Min assist   min guard w/ RW & TTA prosthesis   Curb 4: Min assist   min guard w/ RW & TTA prosthesis     Self-Care   Self-Care ADL's    ADL's able to remove jacket standing with intermittent UE support RW & min gaurd,      Neuro Re-ed    Neuro Re-ed Details  standing balance on floor with eyes open with minA / tactile cues for  upright posture without UE support: head turns right/left, up/down & diagonals.      Exercises   Exercises Knee/Hip      Knee/Hip Exercises: Stretches   Active Hamstring Stretch Both;1 rep;30 seconds    Active Hamstring Stretch Limitations supine with strap      Knee/Hip Exercises: Aerobic   Nustep --      Knee/Hip Exercises: Machines for Strengthening   Total Gym Leg Press shuttle leg press BLEs 81# 10 reps 3 sets      Knee/Hip Exercises: Standing  Hip Flexion Stengthening;Both;5 reps   stance limb on foam BUEs on //bars, stepping forward off foam pad   Hip Flexion Limitations mirror, verbal & tactile cues.    Hip Abduction Stengthening;Both;1 set;10 reps;Knee straight   standing on foam with BUE support //bars, alternating LEs   Abduction Limitations mirror, verbal & tactile cues    Hip Extension Stengthening;Both;5 reps   stance limb on foam stepping backward off foam   Extension Limitations mirror, verbal & tactile cues    Rocker Board 1 minute   ant/ level / post & right / level / left, square board w/2 pivot points   Rocker Board Limitations mirror, demo, verbal & tactile cues for balance reactions & technique    Other Standing Knee Exercises trunk / hip extension BUEs on //bars to chair seat anteriorly back to upright. He has to move one UE at time to chair & //bar.  PT min guard / tactile cues and verbal cues.      Prosthetics   Current prosthetic wear tolerance (days/week)  daily including to dialysis per wife & pt    Current prosthetic wear tolerance (#hours/day)  wife reports most of awake hours.    Current prosthetic weight-bearing tolerance (hours/day)  Patient tolerated standing for 5 min with partial weight on prosthesis with no c/o pain or discomfort.    Residual limb condition  no skin issues or changes to limb    Education Provided --   see comments above                      PT Short Term Goals - 05/18/21 1422       PT SHORT TERM GOAL #1   Title  Wife verbalizes how to adjust weight at dialysis when wearing prosthesis.    Baseline printed out instructions today and she verbalizes understanding    Time 4    Period Weeks    Status Achieved    Target Date 05/13/21      PT SHORT TERM GOAL #2   Title Patient tolerates prosthesis >10 hrs total on nondialysis days & >4 hours if out of bed on dialysis days without skin issues or limb pain    Baseline around 8 hours on nondialysis days    Time 4    Period Weeks    Status Achieved    Target Date 05/13/21      PT SHORT TERM GOAL #3   Title Patient able to sit to & from stand w/c or chair without armrests to RW with supervision.    Baseline met 9/22    Time 4    Period Weeks    Status Achieved    Target Date 05/13/21      PT SHORT TERM GOAL #4   Title Patient ambulates 150' with RW & prosthesis with supervision.    Baseline up to 70' on 9/22    Time 4    Period Weeks    Status Achieved    Target Date 05/13/21      PT SHORT TERM GOAL #5   Title Patient able to negotiate ramps & curbs with RW with minA from wife safely.    Baseline Min guard with PT, wife did not attend session    Time 4    Period Weeks    Status Partially Met    Target Date 05/13/21               PT Long Term Goals -  04/27/21 1443       PT LONG TERM GOAL #1   Title Patient's wife demonstrates & verbalized understanding of prosthetic care to enable safe utilization of prosthesis.    Time 12    Period Weeks    Status On-going    Target Date 06/10/21      PT LONG TERM GOAL #2   Title Patient tolerates prosthesis wear >90% of awake hours without skin or limb pain issues.    Time 12    Period Weeks    Status On-going    Target Date 06/10/21      PT LONG TERM GOAL #3   Title Patient able to perform standing ADLs like managing pants to toilet with RW & prosthesis with supervision.    Time 12    Period Weeks    Status On-going    Target Date 06/10/21      PT LONG TERM GOAL #4   Title Patient  ambulates 200' with RW or rollator walker & prosthesis with wife's supervision.    Time 12    Period Weeks    Status On-going    Target Date 06/10/21      PT LONG TERM GOAL #5   Title Patient negotiates ramps & curbs with RW or rollator walker & prosthesis with minA from wife.    Time 12    Period Weeks    Status On-going    Target Date 06/10/21                   Plan - 05/25/21 1353     Clinical Impression Statement Patient was able to ambulate into PT with RW & wife's supervision for first time today. This indicates improved function & decreased burden on wife to not load/unload w/c.  PT worked on standing activities which required intermittent rests between ea activity and significant cueing. Pt seemed to improve balance slightly with activities.    Personal Factors and Comorbidities Comorbidity 3+;Fitness;Time since onset of injury/illness/exacerbation    Comorbidities rt TTA, DM2, HTN, PVD, ESRD w/dialysis, hx right kidney transplant Nov 2015, Stroke, back sg X 2, memory loss    Examination-Activity Limitations Stand;Transfers;Toileting;Stairs;Locomotion Level    Examination-Participation Restrictions Community Activity;Other   household mobility & ADLs   Stability/Clinical Decision Making Evolving/Moderate complexity    Rehab Potential Good    PT Frequency 2x / week    PT Duration 12 weeks    PT Treatment/Interventions ADLs/Self Care Home Management;DME Instruction;Gait training;Stair training;Functional mobility training;Therapeutic activities;Therapeutic exercise;Balance training;Neuromuscular re-education;Patient/family education;Prosthetic Training;Vestibular    PT Next Visit Plan standing balance & functional exercises.  work towards updated LTGs, continue to work to prosthetic gait with RW & standing balance activities, leg press if time permits    Consulted and Agree with Plan of Care Patient    Family Member Consulted wife, Daimien Patmon              Patient will benefit from skilled therapeutic intervention in order to improve the following deficits and impairments:  Abnormal gait, Decreased activity tolerance, Decreased balance, Decreased endurance, Decreased knowledge of use of DME, Decreased mobility, Decreased strength, Increased edema, Impaired flexibility, Postural dysfunction, Prosthetic Dependency  Visit Diagnosis: Other abnormalities of gait and mobility  Abnormal posture  Unsteadiness on feet  Muscle weakness (generalized)     Problem List Patient Active Problem List   Diagnosis Date Noted   Osteomyelitis ([place]) 10/30/2020   Encounter for general adult medical examination without abnormal findings 09/15/2020  Hemiplegia of dominant side ([place]) 09/15/2020   History of cerebrovascular accident 09/15/2020   History of COVID-19 09/15/2020   Hypercoagulable state ([place]) 09/15/2020   Other chronic osteomyelitis, right ankle and foot ([place]) 09/15/2020   Right flank pain 09/15/2020   Atherosclerosis of native arteries of the extremities with ulceration ([place]) 08/05/2020   Encounter for removal of sutures 07/27/2020   Neuropathic ulcer of foot, unspecified laterality, limited to breakdown of skin ([place]) 06/09/2020   Anemia 01/16/2020   Headache, unspecified 12/03/2019   Other disorders of phosphorus metabolism 10/25/2019   Unspecified protein-calorie malnutrition ([place]) 09/27/2019   FUO (fever of unknown origin) 09/17/2019   Cerebral thrombosis with cerebral infarction 08/02/2019   Cerebral embolism with cerebral infarction 08/02/2019   Acute respiratory failure due to COVID-19 [facility]) 07/30/2019   Acute respiratory failure with hypoxia ([place]) 07/30/2019   Allergy, unspecified, initial encounter 93/79/0240   Chronic systolic HF (heart failure) ([place]) 02/25/2019   ESRD (end stage renal disease) on dialysis ([place]) 02/25/2019   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 kidney  disease, or end stage renal disease ([place]) 12/04/2018   Hypoglycemia, unspecified 08/17/2018   Generalized abdominal pain 07/16/2018   Hypercalcemia 06/28/2018   Arteriovenous fistula for hemodialysis in place, primary ([place]) 05/15/2018   Failed kidney transplant 05/15/2018   Anemia in chronic kidney disease 05/13/2018   Idiopathic gout, unspecified site 97/35/3299   Complication of vascular dialysis catheter 01/16/2018   Other specified coagulation defects ([place]) 01/16/2018   Pain, unspecified 01/16/2018   Pruritus, unspecified 01/16/2018   Secondary hyperparathyroidism of renal origin ([place]) 01/16/2018   Shortness of breath 01/16/2018   Type 2 diabetes mellitus with diabetic peripheral angiopathy without gangrene ([place]) 01/16/2018   Abnormal gait 01/10/2018   Dependence on renal dialysis ([place]) 01/10/2018   Iron deficiency anemia, unspecified 12/21/2017   Acute blood loss anemia 12/14/2017   Hypomagnesemia 12/03/2017   Anticoagulated 12/02/2017   Stroke (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 11/28/2017   History of MI (myocardial infarction) 11/28/2017   Hypertension 11/28/2017   Chronic hepatitis C without hepatic coma ([place]) 09/25/2017   Diabetes mellitus type 2, uncomplicated ([name]) 24/26/8341   Myocardial infarction ([place]) 09/25/2017   Renal transplant recipient 09/25/2017   Transfusion history 09/25/2017   Vascular dementia without behavioral disturbance ([place]) 03/10/2017   History of coronary artery disease 09/12/2016   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 MI ([place]) 09/12/2016   Acute coronary thrombosis not resulting in myocardial infarction ([place]) 09/12/2016   Aftercare following organ transplant 09/11/2016   Mineral metabolism disorder 09/11/2016   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 [place], [place], [address] Phone: [phone]   Fax:  [phone]  Name: Jonathan SCHMADER Sr. MRN: [id number] Date of Birth: 24-Jun-1951

## 2021-05-27 ENCOUNTER — Other Ambulatory Visit: Payer: Self-pay

## 2021-05-27 ENCOUNTER — Ambulatory Visit (INDEPENDENT_AMBULATORY_CARE_PROVIDER_SITE_OTHER): Payer: Medicare Other | Admitting: Physical Therapy

## 2021-05-27 DIAGNOSIS — R2689 Other abnormalities of gait and mobility: Secondary | ICD-10-CM

## 2021-05-27 DIAGNOSIS — M6281 Muscle weakness (generalized): Secondary | ICD-10-CM

## 2021-05-27 DIAGNOSIS — R293 Abnormal posture: Secondary | ICD-10-CM

## 2021-05-27 DIAGNOSIS — R2681 Unsteadiness on feet: Secondary | ICD-10-CM | POA: Diagnosis not present

## 2021-05-27 NOTE — Therapy (Signed)
[facility] [address] [place], [place], [address] Phone: [phone]   Fax:  [phone]  Physical Therapy Treatment  Patient Details  Name: Jonathan GOYER Sr. MRN: [id number] Date of Birth: [birth year] Referring Provider (PT): Jamelle Haring, MD   Encounter Date: 05/27/2021   PT End of Session - 05/27/21 1443     Visit Number 15    Number of Visits 25    Date for PT Re-Evaluation 06/10/21    Authorization Type BCBS Medicare    Authorization Time Period OOP & Ded met, $0 co-pay    Progress Note Due on Visit 20    PT Start Time 1408    PT Stop Time 1446    PT Time Calculation (min) 38 min    Equipment Utilized During Treatment Gait belt    Activity Tolerance Patient tolerated treatment well;Other (comment)   limited by cognitive deficits / memory   Behavior During Therapy Flat affect             Past Medical History:  Diagnosis Date   CAD (coronary artery disease)    CHF (congestive heart failure) ([name])    Diabetes mellitus without complication ([place])    type 2   DM (diabetes mellitus) ([place])    ESRD (end stage renal disease) ([place])    FUO (fever of unknown origin) 09/17/2019   Hyperlipidemia    Hypertension    Osteomyelitis ([place])    Peripheral arterial disease ([name])    PVD (peripheral vascular disease) ([place])    Renal disorder 2015   right kidney transplant   Stroke [facility])    Vascular dementia [facility])     Past Surgical History:  Procedure Laterality Date   ABDOMINAL AORTOGRAM N/A 08/05/2020   Procedure: ABDOMINAL AORTOGRAM;  Surgeon: [name], MD;  Location: [place] [facility];  Service: Cardiovascular;  Laterality: N/A;   ABDOMINAL AORTOGRAM W/LOWER EXTREMITY N/A 09/09/2020   Procedure: ABDOMINAL AORTOGRAM W/LOWER EXTREMITY;  Surgeon: [name], MD;  Location: [place] [facility];  Service: Cardiovascular;  Laterality: N/A;   AMPUTATION Right 08/06/2020   Procedure: Right Fifth Toe Ray Amputation;  Surgeon:  [name], MD;  Location: [place];  Service: Vascular;  Laterality: Right;   AMPUTATION Right 11/02/2020   Procedure: RIGHT AMPUTATION BELOW KNEE;  Surgeon: [name], MD;  Location: [place];  Service: Vascular;  Laterality: Right;   BACK SURGERY     Has had 2 back surgeries   BUBBLE STUDY  09/18/2019   Procedure: BUBBLE STUDY;  Surgeon: Buford Dresser, MD;  Location: [place];  Service: Cardiovascular;;   Depression     ENDARTERECTOMY FEMORAL Right 08/06/2020   Procedure: Right Ilio- Femoral Artery Endarterectomy;  Surgeon: [name], MD;  [facility];  Service: Vascular;  Laterality: Right;   HAND SURGERY Right    HAND SURGERY     IR REMOVAL TUN CV CATH W/O [facility]  09/03/2019   KIDNEY TRANSPLANT     November 2015   LEG AMPUTATION BELOW KNEE Right    LOWER EXTREMITY ANGIOGRAPHY Bilateral 08/05/2020   Procedure: LOWER EXTREMITY ANGIOGRAPHY;  Surgeon: [name], MD;  Location: [place] [facility];  Service: Cardiovascular;  Laterality: Bilateral;   Memory loss     NEPHRECTOMY TRANSPLANTED ORGAN     PATCH ANGIOPLASTY Right 08/06/2020   Procedure: PATCH ANGIOPLASTY;  Surgeon: [name], MD;  Location: [place];  Service: Vascular;  Laterality: Right;   PERIPHERAL VASCULAR BALLOON  ANGIOPLASTY Right 09/09/2020   Procedure: PERIPHERAL VASCULAR BALLOON ANGIOPLASTY;  Surgeon: [name], MD;  Location: [place] [facility];  Service: Cardiovascular;  Laterality: Right;  popliteal   PERIPHERAL VASCULAR INTERVENTION Right 08/05/2020   Procedure: PERIPHERAL VASCULAR INTERVENTION;  Surgeon: [name], MD;  Location: [place] [facility];  Service: Cardiovascular;  Laterality: Right;  SFA   PERIPHERAL VASCULAR INTERVENTION Right 09/09/2020   Procedure: PERIPHERAL VASCULAR INTERVENTION;  Surgeon: [name], MD;  Location: [place] [facility];  Service: Cardiovascular;  Laterality: Right;  external iliac   TEE WITHOUT CARDIOVERSION N/A 09/18/2019    Procedure: TRANSESOPHAGEAL ECHOCARDIOGRAM (TEE);  Surgeon: Buford Dresser, MD;  Location: [facility];  Service: Cardiovascular;  Laterality: N/A;   WOUND DEBRIDEMENT Right 10/08/2020   Procedure: DEBRIDEMENT WOUND RIGHT FOOT;  Surgeon: [name], MD;  Location: [place];  Service: Vascular;  Laterality: Right;    There were no vitals filed for this visit.   Subjective Assessment - 05/27/21 1444     Subjective He denies pain upon arrival or denies any issues with prosthesis    Patient is accompained by: Family member    Pertinent History rt TTA, DM2, HTN, PVD, ESRD w/dialysis, hx right kidney transplant Nov 2015, Stroke, back sg X 2, memory issues    Limitations Lifting;Walking;House hold activities    Patient Stated Goals use prosthesis to get around house & community, be able to get in & out of high truck. be able to go to bathroom without assistance.              [place] Adult PT Treatment/Exercise - 05/27/21 0001       Transfers   Transfers Sit to Stand;Stand to Sit    Sit to Stand 5: Supervision    Sit to Stand Details Verbal cues for technique    Stand to Sit With upper extremity assist;To chair/3-in-1;5: Supervision    Stand to Sit Details (indicate cue type and reason) Verbal cues for technique    Comments Continued cues for hand placement and safety when sitting      Ambulation/Gait   Ambulation/Gait Yes    Ambulation/Gait Assistance 5: Supervision    Ambulation/Gait Assistance Details tactile & verbal cues for upright posture    Ambulation Distance (Feet) 120 Feet   120' X 2   Assistive device Rolling walker;Prosthesis    Ramp 4: Min assist   min guard w/ RW & TTA prosthesis   Ramp Details (indicate cue type and reason) wife provided verbal cues for him    Curb 4: Min assist   min guard w/ RW & TTA prosthesis   Curb Details (indicate cue type and reason) PT provided vebal cues      Neuro Re-ed    Neuro Re-ed Details  standing balance on foam without UE  support 1 min X2 reps, wife provided assistance to not allow him to hold onto //bars      Knee/Hip Exercises: Stretches   Active Hamstring Stretch Both;1 rep;30 seconds    Active Hamstring Stretch Limitations supine with strap      Knee/Hip Exercises: Machines for Strengthening   Total Gym Leg Press shuttle leg press BLEs 81# 10 reps then had to drop to 75# for 10 reps due to fatigue      Knee/Hip Exercises: Standing   Hip Flexion Stengthening;Both;5 reps   stance limb on foam BUEs on //bars, stepping forward off foam pad   Hip Flexion Limitations mirror, verbal &  tactile cues.    Hip Abduction Stengthening;Both;1 set;10 reps;Knee straight   standing on foam with BUE support //bars, alternating LEs   Abduction Limitations mirror, verbal & tactile cues      Knee/Hip Exercises: Seated   Sit to Sand 10 reps;with UE support   from standard chair in bars using UE on bars to assist up then slow eccentric lower     Prosthetics   Prosthetic Care Comments  We looked at his dress shoes which are too narrow for his prosthetic foot and recommendations were given to talk to prosthetist about smaller foot or shaving down foot shell to allow him to switch out shoes                       PT Short Term Goals - 05/18/21 1422       PT SHORT TERM GOAL #1   Title Wife verbalizes how to adjust weight at dialysis when wearing prosthesis.    Baseline printed out instructions today and she verbalizes understanding    Time 4    Period Weeks    Status Achieved    Target Date 05/13/21      PT SHORT TERM GOAL #2   Title Patient tolerates prosthesis >10 hrs total on nondialysis days & >4 hours if out of bed on dialysis days without skin issues or limb pain    Baseline around 8 hours on nondialysis days    Time 4    Period Weeks    Status Achieved    Target Date 05/13/21      PT SHORT TERM GOAL #3   Title Patient able to sit to & from stand w/c or chair without armrests to RW with  supervision.    Baseline met 9/22    Time 4    Period Weeks    Status Achieved    Target Date 05/13/21      PT SHORT TERM GOAL #4   Title Patient ambulates 150' with RW & prosthesis with supervision.    Baseline up to 70' on 9/22    Time 4    Period Weeks    Status Achieved    Target Date 05/13/21      PT SHORT TERM GOAL #5   Title Patient able to negotiate ramps & curbs with RW with minA from wife safely.    Baseline Min guard with PT, wife did not attend session    Time 4    Period Weeks    Status Partially Met    Target Date 05/13/21               PT Long Term Goals - 04/27/21 1443       PT LONG TERM GOAL #1   Title Patient's wife demonstrates & verbalized understanding of prosthetic care to enable safe utilization of prosthesis.    Time 12    Period Weeks    Status On-going    Target Date 06/10/21      PT LONG TERM GOAL #2   Title Patient tolerates prosthesis wear >90% of awake hours without skin or limb pain issues.    Time 12    Period Weeks    Status On-going    Target Date 06/10/21      PT LONG TERM GOAL #3   Title Patient able to perform standing ADLs like managing pants to toilet with RW & prosthesis with supervision.    Time 12    Period  Weeks    Status On-going    Target Date 06/10/21      PT LONG TERM GOAL #4   Title Patient ambulates 200' with RW or rollator walker & prosthesis with wife's supervision.    Time 12    Period Weeks    Status On-going    Target Date 06/10/21      PT LONG TERM GOAL #5   Title Patient negotiates ramps & curbs with RW or rollator walker & prosthesis with minA from wife.    Time 12    Period Weeks    Status On-going    Target Date 06/10/21                   Plan - 05/27/21 1504     Clinical Impression Statement Continued to work to improve prosthetic gait, leg strength, balance, and endurance. He was overall more agitated this visit but is more receptive to cues and instrucitons from his wife who  was very helpful to provide encouragment and motivation for him this session.    Personal Factors and Comorbidities Comorbidity 3+;Fitness;Time since onset of injury/illness/exacerbation    Comorbidities rt TTA, DM2, HTN, PVD, ESRD w/dialysis, hx right kidney transplant Nov 2015, Stroke, back sg X 2, memory loss    Examination-Activity Limitations Stand;Transfers;Toileting;Stairs;Locomotion Level    Examination-Participation Restrictions Community Activity;Other   household mobility & ADLs   Stability/Clinical Decision Making Evolving/Moderate complexity    Rehab Potential Good    PT Frequency 2x / week    PT Duration 12 weeks    PT Treatment/Interventions ADLs/Self Care Home Management;DME Instruction;Gait training;Stair training;Functional mobility training;Therapeutic activities;Therapeutic exercise;Balance training;Neuromuscular re-education;Patient/family education;Prosthetic Training;Vestibular    PT Next Visit Plan wife was asking about rollator so we may work towards this if his balance improves although dementia may be limiting factor to rollator? continue to work on standing balance & functional exercises.  work towards updated LTGs, continue to work to prosthetic gait with RW & standing balance activities    Consulted and Agree with Plan of Care Patient    Family Member Consulted wife, Christyan Reger             Patient will benefit from skilled therapeutic intervention in order to improve the following deficits and impairments:  Abnormal gait, Decreased activity tolerance, Decreased balance, Decreased endurance, Decreased knowledge of use of DME, Decreased mobility, Decreased strength, Increased edema, Impaired flexibility, Postural dysfunction, Prosthetic Dependency  Visit Diagnosis: Other abnormalities of gait and mobility  Abnormal posture  Unsteadiness on feet  Muscle weakness (generalized)     Problem List Patient Active Problem List   Diagnosis Date Noted    Osteomyelitis ([place]) 10/30/2020   Encounter for general adult medical examination without abnormal findings 09/15/2020   Hemiplegia of dominant side ([place]) 09/15/2020   History of cerebrovascular accident 09/15/2020   History of COVID-19 09/15/2020   Hypercoagulable state ([place]) 09/15/2020   Other chronic osteomyelitis, right ankle and foot ([place]) 09/15/2020   Right flank pain 09/15/2020   Atherosclerosis of native arteries of the extremities with ulceration ([place]) 08/05/2020   Encounter for removal of sutures 07/27/2020   Neuropathic ulcer of foot, unspecified laterality, limited to breakdown of skin ([place]) 06/09/2020   Anemia 01/16/2020   Headache, unspecified 12/03/2019   Other disorders of phosphorus metabolism 10/25/2019   Unspecified protein-calorie malnutrition ([place]) 09/27/2019   FUO (fever of unknown origin) 09/17/2019   Cerebral thrombosis with cerebral infarction 08/02/2019   Cerebral embolism with cerebral infarction 08/02/2019  Acute respiratory failure due to COVID-19 [facility]) 07/30/2019   Acute respiratory failure with hypoxia ([place]) 07/30/2019   Allergy, unspecified, initial encounter 23/76/2831   Chronic systolic HF (heart failure) ([place]) 02/25/2019   ESRD (end stage renal disease) on dialysis ([place]) 02/25/2019   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 kidney disease, or end stage renal disease ([place]) 12/04/2018   Hypoglycemia, unspecified 08/17/2018   Generalized abdominal pain 07/16/2018   Hypercalcemia 06/28/2018   Arteriovenous fistula for hemodialysis in place, primary ([place]) 05/15/2018   Failed kidney transplant 05/15/2018   Anemia in chronic kidney disease 05/13/2018   Idiopathic gout, unspecified site 51/76/1607   Complication of vascular dialysis catheter 01/16/2018   Other specified coagulation defects ([place]) 01/16/2018   Pain, unspecified 01/16/2018   Pruritus, unspecified 01/16/2018   Secondary hyperparathyroidism of  renal origin ([place]) 01/16/2018   Shortness of breath 01/16/2018   Type 2 diabetes mellitus with diabetic peripheral angiopathy without gangrene ([place]) 01/16/2018   Abnormal gait 01/10/2018   Dependence on renal dialysis ([place]) 01/10/2018   Iron deficiency anemia, unspecified 12/21/2017   Acute blood loss anemia 12/14/2017   Hypomagnesemia 12/03/2017   Anticoagulated 12/02/2017   Stroke ([place]) 11/29/2017   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 11/28/2017   History of MI (myocardial infarction) 11/28/2017   Hypertension 11/28/2017   Chronic hepatitis C without hepatic coma ([place]) 09/25/2017   Diabetes mellitus type 2, uncomplicated ([name]) 37/05/6268   Myocardial infarction ([place]) 09/25/2017   Renal transplant recipient 09/25/2017   Transfusion history 09/25/2017   Vascular dementia without behavioral disturbance ([place]) 03/10/2017   History of coronary artery disease 09/12/2016   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 MI ([place]) 09/12/2016   Acute coronary thrombosis not resulting in myocardial infarction ([place]) 09/12/2016   Aftercare following organ transplant 09/11/2016   Mineral metabolism disorder 09/11/2016   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 MRN: [id number] Date of Birth: 08/26/1950

## 2021-06-01 ENCOUNTER — Telehealth: Payer: Self-pay | Admitting: Physical Therapy

## 2021-06-01 ENCOUNTER — Encounter: Payer: Medicare Other | Admitting: Physical Therapy

## 2021-06-01 NOTE — Telephone Encounter (Signed)
PT called regarding no-show appointment 10/25 at 1:45.  Left message including next appointment.

## 2021-06-03 ENCOUNTER — Inpatient Hospital Stay (HOSPITAL_COMMUNITY)
Admission: EM | Admit: 2021-06-03 | Discharge: 2021-06-10 | DRG: 521 | Disposition: A | Discharge status: Rehabilitation Facility | Payer: Medicare Other | Attending: Internal Medicine | Admitting: Internal Medicine

## 2021-06-03 ENCOUNTER — Emergency Department (HOSPITAL_COMMUNITY): Payer: Medicare Other

## 2021-06-03 ENCOUNTER — Encounter: Payer: Medicare Other | Admitting: Physical Therapy

## 2021-06-03 DIAGNOSIS — E1151 Type 2 diabetes mellitus with diabetic peripheral angiopathy without gangrene: Secondary | ICD-10-CM | POA: Diagnosis present

## 2021-06-03 DIAGNOSIS — F01B18 Vascular dementia, moderate, with other behavioral disturbance: Secondary | ICD-10-CM | POA: Diagnosis present

## 2021-06-03 DIAGNOSIS — D631 Anemia in chronic kidney disease: Secondary | ICD-10-CM | POA: Diagnosis present

## 2021-06-03 DIAGNOSIS — S72001A Fracture of unspecified part of neck of right femur, initial encounter for closed fracture: Secondary | ICD-10-CM

## 2021-06-03 DIAGNOSIS — Z7982 Long term (current) use of aspirin: Secondary | ICD-10-CM

## 2021-06-03 DIAGNOSIS — M109 Gout, unspecified: Secondary | ICD-10-CM | POA: Diagnosis present

## 2021-06-03 DIAGNOSIS — Z7901 Long term (current) use of anticoagulants: Secondary | ICD-10-CM

## 2021-06-03 DIAGNOSIS — Z79899 Other long term (current) drug therapy: Secondary | ICD-10-CM

## 2021-06-03 DIAGNOSIS — Z20822 Contact with and (suspected) exposure to covid-19: Secondary | ICD-10-CM | POA: Diagnosis present

## 2021-06-03 DIAGNOSIS — S72009A Fracture of unspecified part of neck of unspecified femur, initial encounter for closed fracture: Secondary | ICD-10-CM | POA: Diagnosis present

## 2021-06-03 DIAGNOSIS — Y838 Other surgical procedures as the cause of abnormal reaction of the patient, or of later complication, without mention of misadventure at the time of the procedure: Secondary | ICD-10-CM | POA: Diagnosis present

## 2021-06-03 DIAGNOSIS — Z01810 Encounter for preprocedural cardiovascular examination: Secondary | ICD-10-CM | POA: Diagnosis not present

## 2021-06-03 DIAGNOSIS — D62 Acute posthemorrhagic anemia: Secondary | ICD-10-CM | POA: Diagnosis not present

## 2021-06-03 DIAGNOSIS — F01B3 Vascular dementia, moderate, with mood disturbance: Secondary | ICD-10-CM | POA: Diagnosis present

## 2021-06-03 DIAGNOSIS — Y830 Surgical operation with transplant of whole organ as the cause of abnormal reaction of the patient, or of later complication, without mention of misadventure at the time of the procedure: Secondary | ICD-10-CM | POA: Diagnosis present

## 2021-06-03 DIAGNOSIS — K625 Hemorrhage of anus and rectum: Secondary | ICD-10-CM | POA: Diagnosis not present

## 2021-06-03 DIAGNOSIS — R34 Anuria and oliguria: Secondary | ICD-10-CM | POA: Diagnosis present

## 2021-06-03 DIAGNOSIS — I5042 Chronic combined systolic (congestive) and diastolic (congestive) heart failure: Secondary | ICD-10-CM | POA: Diagnosis not present

## 2021-06-03 DIAGNOSIS — I2510 Atherosclerotic heart disease of native coronary artery without angina pectoris: Secondary | ICD-10-CM | POA: Diagnosis present

## 2021-06-03 DIAGNOSIS — Z8673 Personal history of transient ischemic attack (TIA), and cerebral infarction without residual deficits: Secondary | ICD-10-CM | POA: Diagnosis not present

## 2021-06-03 DIAGNOSIS — T82856A Stenosis of peripheral vascular stent, initial encounter: Secondary | ICD-10-CM | POA: Diagnosis present

## 2021-06-03 DIAGNOSIS — Z9889 Other specified postprocedural states: Secondary | ICD-10-CM

## 2021-06-03 DIAGNOSIS — E1122 Type 2 diabetes mellitus with diabetic chronic kidney disease: Secondary | ICD-10-CM | POA: Diagnosis present

## 2021-06-03 DIAGNOSIS — Z96642 Presence of left artificial hip joint: Secondary | ICD-10-CM | POA: Diagnosis present

## 2021-06-03 DIAGNOSIS — I252 Old myocardial infarction: Secondary | ICD-10-CM

## 2021-06-03 DIAGNOSIS — E785 Hyperlipidemia, unspecified: Secondary | ICD-10-CM | POA: Diagnosis present

## 2021-06-03 DIAGNOSIS — T8612 Kidney transplant failure: Secondary | ICD-10-CM | POA: Diagnosis present

## 2021-06-03 DIAGNOSIS — I480 Paroxysmal atrial fibrillation: Secondary | ICD-10-CM | POA: Diagnosis present

## 2021-06-03 DIAGNOSIS — N2581 Secondary hyperparathyroidism of renal origin: Secondary | ICD-10-CM | POA: Diagnosis present

## 2021-06-03 DIAGNOSIS — Y92009 Unspecified place in unspecified non-institutional (private) residence as the place of occurrence of the external cause: Secondary | ICD-10-CM | POA: Diagnosis not present

## 2021-06-03 DIAGNOSIS — B182 Chronic viral hepatitis C: Secondary | ICD-10-CM | POA: Diagnosis present

## 2021-06-03 DIAGNOSIS — Z7952 Long term (current) use of systemic steroids: Secondary | ICD-10-CM

## 2021-06-03 DIAGNOSIS — W19XXXA Unspecified fall, initial encounter: Secondary | ICD-10-CM

## 2021-06-03 DIAGNOSIS — Z7189 Other specified counseling: Secondary | ICD-10-CM | POA: Diagnosis not present

## 2021-06-03 DIAGNOSIS — N186 End stage renal disease: Secondary | ICD-10-CM | POA: Diagnosis not present

## 2021-06-03 DIAGNOSIS — F5104 Psychophysiologic insomnia: Secondary | ICD-10-CM | POA: Diagnosis present

## 2021-06-03 DIAGNOSIS — Z794 Long term (current) use of insulin: Secondary | ICD-10-CM

## 2021-06-03 DIAGNOSIS — I69318 Other symptoms and signs involving cognitive functions following cerebral infarction: Secondary | ICD-10-CM | POA: Diagnosis not present

## 2021-06-03 DIAGNOSIS — I5020 Unspecified systolic (congestive) heart failure: Secondary | ICD-10-CM | POA: Diagnosis not present

## 2021-06-03 DIAGNOSIS — S7222XS Displaced subtrochanteric fracture of left femur, sequela: Secondary | ICD-10-CM | POA: Diagnosis not present

## 2021-06-03 DIAGNOSIS — Z992 Dependence on renal dialysis: Secondary | ICD-10-CM

## 2021-06-03 DIAGNOSIS — I5022 Chronic systolic (congestive) heart failure: Secondary | ICD-10-CM | POA: Diagnosis present

## 2021-06-03 DIAGNOSIS — I255 Ischemic cardiomyopathy: Secondary | ICD-10-CM | POA: Diagnosis present

## 2021-06-03 DIAGNOSIS — Z8679 Personal history of other diseases of the circulatory system: Secondary | ICD-10-CM | POA: Diagnosis not present

## 2021-06-03 DIAGNOSIS — Z833 Family history of diabetes mellitus: Secondary | ICD-10-CM

## 2021-06-03 DIAGNOSIS — R5381 Other malaise: Secondary | ICD-10-CM | POA: Diagnosis present

## 2021-06-03 DIAGNOSIS — Z515 Encounter for palliative care: Secondary | ICD-10-CM | POA: Diagnosis not present

## 2021-06-03 DIAGNOSIS — Z87891 Personal history of nicotine dependence: Secondary | ICD-10-CM

## 2021-06-03 DIAGNOSIS — R531 Weakness: Secondary | ICD-10-CM | POA: Diagnosis not present

## 2021-06-03 DIAGNOSIS — E782 Mixed hyperlipidemia: Secondary | ICD-10-CM | POA: Diagnosis not present

## 2021-06-03 DIAGNOSIS — D696 Thrombocytopenia, unspecified: Secondary | ICD-10-CM | POA: Diagnosis present

## 2021-06-03 DIAGNOSIS — M898X9 Other specified disorders of bone, unspecified site: Secondary | ICD-10-CM | POA: Diagnosis present

## 2021-06-03 DIAGNOSIS — I132 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 kidney disease, or end stage renal disease: Secondary | ICD-10-CM | POA: Diagnosis present

## 2021-06-03 DIAGNOSIS — W1830XA Fall on same level, unspecified, initial encounter: Secondary | ICD-10-CM | POA: Diagnosis present

## 2021-06-03 DIAGNOSIS — F0150 Vascular dementia without behavioral disturbance: Secondary | ICD-10-CM | POA: Diagnosis not present

## 2021-06-03 DIAGNOSIS — S72002A Fracture of unspecified part of neck of left femur, initial encounter for closed fracture: Secondary | ICD-10-CM | POA: Diagnosis present

## 2021-06-03 DIAGNOSIS — Z89511 Acquired absence of right leg below knee: Secondary | ICD-10-CM

## 2021-06-03 DIAGNOSIS — I509 Heart failure, unspecified: Secondary | ICD-10-CM

## 2021-06-03 LAB — I-STAT CHEM 8, ED
BUN: 51 mg/dL — ABNORMAL HIGH (ref 8–23)
Calcium, Ion: 1.02 mmol/L — ABNORMAL LOW (ref 1.15–1.40)
Chloride: 94 mmol/L — ABNORMAL LOW (ref 98–111)
Creatinine, Ser: 8.4 mg/dL — ABNORMAL HIGH (ref 0.61–1.24)
Glucose, Bld: 119 mg/dL — ABNORMAL HIGH (ref 70–99)
HCT: 40 % (ref 39.0–52.0)
Hemoglobin: 13.6 g/dL (ref 13.0–17.0)
Potassium: 4.5 mmol/L (ref 3.5–5.1)
Sodium: 136 mmol/L (ref 135–145)
TCO2: 31 mmol/L (ref 22–32)

## 2021-06-03 LAB — CBC WITH DIFFERENTIAL/PLATELET
Abs Immature Granulocytes: 0.06 10*3/uL (ref 0.00–0.07)
Basophils Absolute: 0 10*3/uL (ref 0.0–0.1)
Basophils Relative: 0 %
Eosinophils Absolute: 0 10*3/uL (ref 0.0–0.5)
Eosinophils Relative: 0 %
HCT: 37.7 % — ABNORMAL LOW (ref 39.0–52.0)
Hemoglobin: 12.5 g/dL — ABNORMAL LOW (ref 13.0–17.0)
Immature Granulocytes: 1 %
Lymphocytes Relative: 12 %
Lymphs Abs: 0.9 10*3/uL (ref 0.7–4.0)
MCH: 31.8 pg (ref 26.0–34.0)
MCHC: 33.2 g/dL (ref 30.0–36.0)
MCV: 95.9 fL (ref 80.0–100.0)
Monocytes Absolute: 0.3 10*3/uL (ref 0.1–1.0)
Monocytes Relative: 4 %
Neutro Abs: 5.9 10*3/uL (ref 1.7–7.7)
Neutrophils Relative %: 83 %
Platelets: 113 10*3/uL — ABNORMAL LOW (ref 150–400)
RBC: 3.93 MIL/uL — ABNORMAL LOW (ref 4.22–5.81)
RDW: 17.4 % — ABNORMAL HIGH (ref 11.5–15.5)
Smear Review: DECREASED
WBC: 7.1 10*3/uL (ref 4.0–10.5)
nRBC: 0 % (ref 0.0–0.2)

## 2021-06-03 LAB — RESP PANEL BY RT-PCR (FLU A&B, COVID) ARPGX2
Influenza A by PCR: NEGATIVE
Influenza B by PCR: NEGATIVE
SARS Coronavirus 2 by RT PCR: NEGATIVE

## 2021-06-03 MED ORDER — ALBUTEROL SULFATE (2.5 MG/3ML) 0.083% IN NEBU
10.0000 mg/h | INHALATION_SOLUTION | Freq: Once | RESPIRATORY_TRACT | Status: AC
  Administered 2021-06-03: 10 mg/h via RESPIRATORY_TRACT
  Filled 2021-06-03: qty 12

## 2021-06-03 MED ORDER — ONDANSETRON HCL 4 MG/2ML IJ SOLN
4.0000 mg | Freq: Once | INTRAMUSCULAR | Status: AC
  Administered 2021-06-03: 4 mg via INTRAVENOUS
  Filled 2021-06-03: qty 2

## 2021-06-03 MED ORDER — SODIUM CHLORIDE 0.9 % IV SOLN
1.0000 g | Freq: Once | INTRAVENOUS | Status: DC
  Administered 2021-06-03: 1 g via INTRAVENOUS
  Filled 2021-06-03: qty 10

## 2021-06-03 MED ORDER — HEPARIN (PORCINE) 25000 UT/250ML-% IV SOLN
1000.0000 [IU]/h | INTRAVENOUS | Status: DC
  Administered 2021-06-04: 1000 [IU]/h via INTRAVENOUS
  Filled 2021-06-03 (×2): qty 250

## 2021-06-03 MED ORDER — SODIUM CHLORIDE 0.9 % IV SOLN: 500.0000 mg | Freq: Once | INTRAVENOUS | Status: DC

## 2021-06-03 MED ORDER — FENTANYL CITRATE PF 50 MCG/ML IJ SOSY
50.0000 ug | PREFILLED_SYRINGE | Freq: Once | INTRAMUSCULAR | Status: AC
  Administered 2021-06-03: 50 ug via INTRAVENOUS
  Filled 2021-06-03: qty 1

## 2021-06-03 NOTE — ED Provider Notes (Signed)
[facility] EMERGENCY DEPARTMENT Provider Note   CSN: [id number] Arrival date & time: 06/03/21  1518     History Chief Complaint  Patient presents with   Hip Pain    Jonathan Gopal. is a 70 y.o. male brought in by EMS after mechanical fall.  Patient has a new right lower extremity prosthesis.  He was apparently standing up out of a chair at home and fell onto his left hip.  Had immediate severe pain in his unable to get himself up off the floor.  Patient has vascular dementia and is a unreliable historian, history is gathered by EMS and chart review.  He is complaining of pain in his left leg.  EMS reports that he had a full session of dialysis yesterday.  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  He is chronically anticoagulated on Eliquis   Hip Pain      Past Medical History:  Diagnosis Date   CAD (coronary artery disease)    CHF (congestive heart failure) ([name])    Diabetes mellitus without complication ([place])    type 2   DM (diabetes mellitus) ([place])    ESRD (end stage renal disease) ([place])    FUO (fever of unknown origin) 09/17/2019   Hyperlipidemia    Hypertension    Osteomyelitis ([place])    Peripheral arterial disease ([name])    PVD (peripheral vascular disease) ([place])    Renal disorder 2015   right kidney transplant   Stroke [facility])    Vascular dementia [facility])     Patient Active Problem List   Diagnosis Date Noted   CHF (congestive heart failure) ([place]) 06/04/2021   Hip fracture ([place]) 06/03/2021   Osteomyelitis ([place]) 10/30/2020   Encounter for general adult medical examination without abnormal findings 09/15/2020   Hemiplegia of dominant side ([place]) 09/15/2020   History of cerebrovascular accident 09/15/2020   History of COVID-19 09/15/2020   Hypercoagulable state ([place]) 09/15/2020   Other chronic osteomyelitis, right  ankle and foot ([place]) 09/15/2020   Right flank pain 09/15/2020   Atherosclerosis of native arteries of the extremities with ulceration ([place]) 08/05/2020   Encounter for removal of sutures 07/27/2020   Neuropathic ulcer of foot, unspecified laterality, limited to breakdown of skin ([place]) 06/09/2020   Anemia 01/16/2020   Headache, unspecified 12/03/2019   Other disorders of phosphorus metabolism 10/25/2019   Unspecified protein-calorie malnutrition ([place]) 09/27/2019   FUO (fever of unknown origin) 09/17/2019   Cerebral thrombosis with cerebral infarction 08/02/2019   Cerebral embolism with cerebral infarction 08/02/2019   Acute respiratory failure due to COVID-19 [facility]) 07/30/2019   Acute respiratory failure with hypoxia ([place]) 07/30/2019   Allergy, unspecified, initial encounter 40/98/1191   Chronic systolic HF (heart failure) ([place]) 02/25/2019   ESRD (end stage renal disease) on dialysis ([place]) 02/25/2019   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 kidney disease, or end stage renal disease ([place]) 12/04/2018   Hypoglycemia, unspecified 08/17/2018   Generalized abdominal pain 07/16/2018   Hypercalcemia 06/28/2018   Arteriovenous fistula for hemodialysis in place, primary ([place]) 05/15/2018   Failed kidney transplant 05/15/2018   Anemia in chronic kidney disease 05/13/2018   Idiopathic gout, unspecified site 47/82/9562   Complication of vascular dialysis catheter 01/16/2018   Other specified coagulation defects ([place]) 01/16/2018   Pain, unspecified 01/16/2018   Pruritus, unspecified 01/16/2018  Secondary hyperparathyroidism of renal origin ([place]) 01/16/2018   Shortness of breath 01/16/2018   Type 2 diabetes mellitus with diabetic peripheral angiopathy without gangrene ([place]) 01/16/2018   Abnormal gait 01/10/2018   Dependence on renal dialysis ([place]) 01/10/2018   Iron deficiency anemia, unspecified 12/21/2017   Acute blood loss anemia 12/14/2017    Hypomagnesemia 12/03/2017   Anticoagulated 12/02/2017   Stroke ([place]) 11/29/2017   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   At risk for infection transmitted from donor 06/16/2014   Ureteral stent displacement ([place]) 06/10/2014   Prophylactic antibiotic 06/09/2014   Immunosuppression ([place]) 06/06/2014    Past Surgical History:  Procedure Laterality Date   ABDOMINAL AORTOGRAM N/A 08/05/2020   Procedure: ABDOMINAL AORTOGRAM;  Surgeon: [name], MD;  Location: [facility] CV LAB;  Service: Cardiovascular;  Laterality: N/A;   ABDOMINAL AORTOGRAM W/LOWER EXTREMITY N/A 09/09/2020   Procedure: ABDOMINAL AORTOGRAM W/LOWER EXTREMITY;  Surgeon: [name], MD;  Location: [place] [facility] CV LAB;  Service: Cardiovascular;  Laterality: N/A;   AMPUTATION Right 08/06/2020   Procedure: Right Fifth Toe Ray Amputation;  Surgeon: [name], MD;  Location: [facility];  Service: Vascular;  Laterality: Right;   AMPUTATION Right 11/02/2020   Procedure: RIGHT AMPUTATION BELOW KNEE;  Surgeon: [name], MD;  Location: [facility];  Service: Vascular;  Laterality:  Right;   BACK SURGERY     Has had 2 back surgeries   BUBBLE STUDY  09/18/2019   Procedure: BUBBLE STUDY;  Surgeon: Buford Dresser, MD;  Location: [facility];  Service: Cardiovascular;;   Depression     ENDARTERECTOMY FEMORAL Right 08/06/2020   Procedure: Right Ilio- Femoral Artery Endarterectomy;  Surgeon: [name], MD;  Location: [facility] OR;  Service: Vascular;  Laterality: Right;   HAND SURGERY Right    HAND SURGERY     IR REMOVAL TUN CV CATH W/O [facility]  09/03/2019   KIDNEY TRANSPLANT     November 2015   LEG AMPUTATION BELOW KNEE Right    LOWER EXTREMITY ANGIOGRAPHY Bilateral 08/05/2020   Procedure: LOWER EXTREMITY ANGIOGRAPHY;  Surgeon: [name], MD;  Location: [facility] CV LAB;  Service: Cardiovascular;  Laterality: Bilateral;   Memory loss     NEPHRECTOMY TRANSPLANTED ORGAN     PATCH ANGIOPLASTY Right 08/06/2020   Procedure: PATCH ANGIOPLASTY;  Surgeon: [name], MD;  Location: [facility] OR;  Service: Vascular;  Laterality: Right;   PERIPHERAL VASCULAR BALLOON ANGIOPLASTY Right 09/09/2020   Procedure: PERIPHERAL VASCULAR BALLOON ANGIOPLASTY;  Surgeon: [name], MD;  Location: [facility] CV LAB;  Service: Cardiovascular;  Laterality: Right;  popliteal   PERIPHERAL VASCULAR INTERVENTION Right 08/05/2020   Procedure: PERIPHERAL VASCULAR INTERVENTION;  Surgeon: [name], MD;  Location: [facility]  INVASIVE CV LAB;  Service: Cardiovascular;  Laterality: Right;  SFA   PERIPHERAL VASCULAR INTERVENTION Right 09/09/2020   Procedure: PERIPHERAL VASCULAR INTERVENTION;  Surgeon: [name], MD;  Location: [facility] CV LAB;  Service: Cardiovascular;  Laterality: Right;  external iliac   TEE WITHOUT CARDIOVERSION N/A 09/18/2019   Procedure: TRANSESOPHAGEAL ECHOCARDIOGRAM (TEE);  Surgeon: Buford Dresser, MD;  Location: [facility] ENDOSCOPY;  Service: Cardiovascular;  Laterality: N/A;   WOUND DEBRIDEMENT Right 10/08/2020   Procedure: DEBRIDEMENT WOUND RIGHT FOOT;   Surgeon: [name], MD;  Location: [facility] OR;  Service: Vascular;  Laterality: Right;       Family History  Problem Relation Age of Onset   Diabetes Mellitus II Mother    Cancer Mother        STOMACH   Diabetes Mellitus II Brother    Other Father        UNKOWN   Healthy Daughter    Diabetes Son     Social History   Tobacco Use   Smoking status: Former   Smokeless tobacco: Never  Scientific laboratory technician Use: Never used  Substance Use Topics   Alcohol use: Never   Drug use: Never    Home Medications Prior to Admission medications   Medication Sig Start Date End Date Taking? Authorizing Provider  allopurinol (ZYLOPRIM) 100 MG tablet Take 100 mg by mouth at bedtime. 06/09/14  Yes [provider]  apixaban (ELIQUIS) 5 MG TABS tablet Take 5 mg by mouth 2 (two) times daily.   Yes [provider]  aspirin 81 MG chewable tablet Chew 81 mg by mouth daily.  06/09/14  Yes [provider]  atorvastatin (LIPITOR) 40 MG tablet Take 40 mg by mouth at bedtime.   Yes [provider]  b complex-vitamin c-folic acid (NEPHRO-VITE) 0.8 MG TABS tablet Take 1 tablet by mouth daily. 05/08/20  Yes [provider]  feeding supplement (BOOST HIGH PROTEIN) LIQD Take 1 Container by mouth daily as needed (nutrition).   Yes [provider]  folic acid (FOLVITE) 1 MG tablet Take 1 tablet (1 mg total) by mouth daily. 05/11/21  Yes Garvin Fila, MD  insulin aspart (NOVOLOG) 100 UNIT/ML injection Insulin sliding scale: Blood sugar  120-150   3units                       151-200   4units                       201-250   7units                       251- 300  11units                       301-350   15uints                       351-400   20units                       >400         call MD immediately 11/06/20  Yes Florencia Reasons, MD  Insulin Pen Needle 32G X 4 MM MISC 1 each by Other route as needed (insulin).  09/10/16  Yes [provider]  levalbuterol  (XOPENEX HFA) 45 MCG/ACT inhaler Inhale 2 puffs into the lungs every 8 (  eight) hours as needed for wheezing. 09/21/19  Yes Sheikh, Omair Latif, DO  lidocaine-prilocaine (EMLA) cream Apply 1 application topically 3 (three) times a week. 30 minutes prior to Dialysis - MWF 05/08/18  Yes [provider]  oxyCODONE-acetaminophen (PERCOCET/ROXICET) 5-325 MG tablet Take 1 tablet by mouth every 6 (six) hours as needed for moderate pain. 11/06/20  Yes Florencia Reasons, MD  predniSONE (DELTASONE) 5 MG tablet Take 5 mg by mouth daily with breakfast. 03/02/20  Yes [provider]  senna-docusate (SENOKOT-S) 8.6-50 MG tablet Take 1 tablet by mouth 2 (two) times daily. Hold if diarrhea Patient taking differently: Take 1 tablet by mouth 2 (two) times daily as needed for mild constipation. 11/06/20  Yes Florencia Reasons, MD  sevelamer carbonate (RENVELA) 800 MG tablet Take 800 mg by mouth 3 (three) times daily with meals. 03/31/20  Yes [provider]  donepezil (ARICEPT) 10 MG tablet Take 1 tablet (10 mg total) by mouth at bedtime. Start half a tablet daily for 4 weeks to be increased to 1 tablet daily if tolerated 05/11/21   Garvin Fila, MD    Allergies    Patient has no known allergies.  Review of Systems   Review of Systems  Unable to perform ROS: Dementia   Physical Exam Updated Vital Signs BP (!) 174/68   Pulse 97   Temp 98.4 F (36.9 C) (Oral)   Resp 18   Ht 5\' 9"  (1.753 m)   Wt 72.6 kg   SpO2 97%   BMI 23.63 kg/m   Physical Exam Vitals and nursing note reviewed.  Constitutional:      General: He is not in acute distress.    Appearance: He is well-developed. He is not diaphoretic.  HENT:     Head: Normocephalic and atraumatic.  Eyes:     General: No scleral icterus.    Conjunctiva/sclera: Conjunctivae normal.  Cardiovascular:     Rate and Rhythm: Normal rate and regular rhythm.     Heart sounds: Normal heart sounds.     Comments: Dialysis acces RUE w/ palpable  thrill Pulmonary:     Effort: Pulmonary effort is normal. No respiratory distress.     Breath sounds: Normal breath sounds.  Abdominal:     Palpations: Abdomen is soft.     Tenderness: There is no abdominal tenderness.  Musculoskeletal:     Cervical back: Normal range of motion and neck supple.     Comments: L hip point tender over trochanter- obvious deformity- unable to tolerate PROM. NVI  Skin:    General: Skin is warm and dry.  Neurological:     Mental Status: He is alert.  Psychiatric:        Behavior: Behavior normal.    ED Results / Procedures / Treatments   Labs (all labs ordered are listed, but only abnormal results are displayed) Labs Reviewed  CBC WITH DIFFERENTIAL/PLATELET - Abnormal; Notable for the following components:      Result Value   RBC 3.93 (*)    Hemoglobin 12.5 (*)    HCT 37.7 (*)    RDW 17.4 (*)    Platelets 113 (*)    All other components within normal limits  PROTIME-INR - Abnormal; Notable for the following components:   Prothrombin Time 16.4 (*)    INR 1.3 (*)    All other components within normal limits  BASIC METABOLIC PANEL - Abnormal; Notable for the following components:   Chloride 95 (*)    Glucose, Bld  101 (*)    BUN 62 (*)    Creatinine, Ser 8.83 (*)    Calcium 8.8 (*)    GFR, Estimated 6 (*)    All other components within normal limits  HEPARIN LEVEL (UNFRACTIONATED) - Abnormal; Notable for the following components:   Heparin Unfractionated >1.10 (*)    All other components within normal limits  APTT - Abnormal; Notable for the following components:   aPTT 40 (*)    All other components within normal limits  CBC - Abnormal; Notable for the following components:   RBC 3.29 (*)    Hemoglobin 10.2 (*)    HCT 31.6 (*)    RDW 17.5 (*)    All other components within normal limits  BRAIN NATRIURETIC PEPTIDE - Abnormal; Notable for the following components:   B Natriuretic Peptide 2,665.2 (*)    All other components within normal  limits  BASIC METABOLIC PANEL - Abnormal; Notable for the following components:   Chloride 94 (*)    Glucose, Bld 100 (*)    BUN 64 (*)    Creatinine, Ser 9.07 (*)    Calcium 8.7 (*)    GFR, Estimated 6 (*)    Anion gap 16 (*)    All other components within normal limits  GLUCOSE, CAPILLARY - Abnormal; Notable for the following components:   Glucose-Capillary 100 (*)    All other components within normal limits  I-STAT CHEM 8, ED - Abnormal; Notable for the following components:   Chloride 94 (*)    BUN 51 (*)    Creatinine, Ser 8.40 (*)    Glucose, Bld 119 (*)    Calcium, Ion 1.02 (*)    All other components within normal limits  CBG MONITORING, ED - Abnormal; Notable for the following components:   Glucose-Capillary 105 (*)    All other components within normal limits  TROPONIN I (HIGH SENSITIVITY) - Abnormal; Notable for the following components:   Troponin I (High Sensitivity) 80 (*)    All other components within normal limits  TROPONIN I (HIGH SENSITIVITY) - Abnormal; Notable for the following components:   Troponin I (High Sensitivity) 75 (*)    All other components within normal limits  RESP PANEL BY RT-PCR (FLU A&B, COVID) ARPGX2  SURGICAL PCR SCREEN  HEMOGLOBIN A1C  GLUCOSE, CAPILLARY  CORTISOL-AM, BLOOD  TYPE AND SCREEN    EKG EKG Interpretation  Date/Time:  Thursday June 03 2021 16:01:46 EDT Ventricular Rate:  91 PR Interval:  146 QRS Duration: 89 QT Interval:  384 QTC Calculation: 473 R Axis:   77 Text Interpretation: Sinus rhythm Anterior infarct, old Nonspecific T abnormalities, lateral leads Baseline wander in lead(s) V5 Artifact No significant change since last tracing Confirmed by Calvert Cantor [phone]) on 06/03/2021 4:58:44 PM  Radiology DG Chest 1 View  Result Date: 06/03/2021 CLINICAL DATA:  Fall with hip fracture EXAM: CHEST  1 VIEW COMPARISON:  09/15/2019, 07/30/2019 FINDINGS: Chronic elevation of left diaphragm. Diffuse bilateral  reticular interstitial opacity consistent with chronic lung disease. Mild cardiomegaly with aortic atherosclerosis. No pleural effusion, pneumothorax, or acute airspace disease. IMPRESSION: Chronic interstitial lung disease.  No acute airspace disease. Electronically Signed   By: Donavan Foil M.D.   On: 06/03/2021 16:00   ECHOCARDIOGRAM COMPLETE  Result Date: 06/04/2021    ECHOCARDIOGRAM REPORT   Patient Name:   Jonathan WEIDNER Sr. Date of Exam: 06/04/2021 Medical Rec #:  [id number]          Height:  69.0 in Accession #:    [id number]         Weight:       160.0 lb Date of Birth:  March 24, 1951          BSA:          1.879 m Patient Age:    60 years           BP:           142/62 mmHg Patient Gender: M                  HR:           91 bpm. Exam Location:  Inpatient Procedure: 2D Echo, Cardiac Doppler, Color Doppler and Intracardiac            Opacification Agent Indications:     Apical Thrombus  History:         Patient has prior history of Echocardiogram examinations, most                  recent 08/01/2019. Risk Factors:Diabetes, Hypertension and                  Family History of Coronary Artery Disease.  Sonographer:     Helmut Muster Referring Phys:  [id number] [place] Diagnosing Phys: Mertie Moores MD IMPRESSIONS  1. With Definity contrast, an apical thrombus is visualized in the LV apex.         . Left ventricular ejection fraction, by estimation, is 30 to 35%. The left ventricle has moderately decreased function. The left ventricle demonstrates regional wall motion abnormalities (see scoring diagram/findings for description). The left ventricular internal cavity size was mildly to moderately dilated. Left ventricular diastolic parameters are consistent with Grade II diastolic dysfunction (pseudonormalization). There is severe akinesis of the left ventricular, mid-apical anteroseptal wall and apical segment.  2. Right ventricular systolic function is normal. The right ventricular size is  normal.  3. Left atrial size was moderately dilated.  4. Right atrial size was mildly dilated.  5. A small pericardial effusion is present.  6. The mitral valve is grossly normal. Severe mitral valve regurgitation.  7. The aortic valve is grossly normal. Aortic valve regurgitation is not visualized. FINDINGS  Left Ventricle: With Definity contrast, an apical thrombus is visualized in the LV apex. Left ventricular ejection fraction, by estimation, is 30 to 35%. The left ventricle has moderately decreased function. The left ventricle demonstrates regional wall motion abnormalities. Severe akinesis of the left ventricular, mid-apical anteroseptal wall and apical segment. Definity contrast agent was given IV to delineate the left ventricular endocardial borders. The left ventricular internal cavity size was mildly to moderately dilated. There is no left ventricular hypertrophy. Left ventricular diastolic parameters are consistent with Grade II diastolic dysfunction (pseudonormalization). Right Ventricle: The right ventricular size is normal. Right vetricular wall thickness was not well visualized. Right ventricular systolic function is normal. Left Atrium: Left atrial size was moderately dilated. Right Atrium: Right atrial size was mildly dilated. Pericardium: A small pericardial effusion is present. Mitral Valve: The mitral valve is grossly normal. Severe mitral valve regurgitation. Tricuspid Valve: The tricuspid valve is grossly normal. Tricuspid valve regurgitation is not demonstrated. Aortic Valve: The aortic valve is grossly normal. Aortic valve regurgitation is not visualized. Pulmonic Valve: The pulmonic valve was not well visualized. Pulmonic valve regurgitation is not visualized. Aorta: The aortic root and ascending aorta are structurally normal, with no evidence of dilitation. IAS/Shunts: The atrial septum  is grossly normal. Additional Comments: A device lead is visualized.  LEFT VENTRICLE PLAX 2D LVIDd:          5.00 cm      Diastology LVIDs:         4.20 cm      LV e' medial:    6.66 cm/s LV PW:         1.00 cm      LV E/e' medial:  19.1 LV IVS:        1.00 cm      LV e' lateral:   9.14 cm/s LVOT diam:     1.90 cm      LV E/e' lateral: 13.9 LV SV:         48 LV SV Index:   25 LVOT Area:     2.84 cm  LV Volumes (MOD) LV vol d, MOD A2C: 257.0 ml LV vol d, MOD A4C: 205.0 ml LV vol s, MOD A2C: 173.0 ml LV vol s, MOD A4C: 161.0 ml LV SV MOD A2C:     84.0 ml LV SV MOD A4C:     205.0 ml LV SV MOD BP:      66.5 ml RIGHT VENTRICLE             IVC RV S prime:     11.70 cm/s  IVC diam: 2.20 cm TAPSE (M-mode): 2.1 cm LEFT ATRIUM             Index        RIGHT ATRIUM           Index LA diam:        5.00 cm 2.66 cm/m   RA Area:     18.40 cm LA Vol (A2C):   72.0 ml 38.32 ml/m  RA Volume:   51.40 ml  27.35 ml/m LA Vol (A4C):   97.2 ml 51.73 ml/m LA Biplane Vol: 84.1 ml 44.76 ml/m  AORTIC VALVE LVOT Vmax:   104.00 cm/s LVOT Vmean:  68.500 cm/s LVOT VTI:    0.169 m  AORTA Ao Root diam: 2.90 cm Ao Asc diam:  3.30 cm MITRAL VALVE MV Area (PHT): 4.71 cm       SHUNTS MV Decel Time: 161 msec       Systemic VTI:  0.17 m MR Peak grad:    162.3 mmHg   Systemic Diam: 1.90 cm MR Mean grad:    97.0 mmHg MR Vmax:         637.00 cm/s MR Vmean:        460.0 cm/s MR PISA:         5.09 cm MR PISA Eff ROA: 25 mm MR PISA Radius:  0.90 cm MV E velocity: 127.00 cm/s MV A velocity: 105.00 cm/s MV E/A ratio:  1.21 Mertie Moores MD Electronically signed by Mertie Moores MD Signature Date/Time: 06/04/2021/10:06:57 AM    Final    DG Hip Unilat With Pelvis 2-3 Views Left  Result Date: 06/03/2021 CLINICAL DATA:  Fall EXAM: DG HIP (WITH OR WITHOUT PELVIS) 2-3V LEFT COMPARISON:  None. FINDINGS: Extensive vascular calcifications. Pubic symphysis and rami appear intact. Acute left femoral neck fracture with cranial displacement of trochanter. Femoral head projects in joint. IMPRESSION: Acute mildly displaced left femoral neck fracture Electronically  Signed   By: Donavan Foil M.D.   On: 06/03/2021 15:58    Procedures Procedures   Medications Ordered in ED Medications  chlorhexidine (HIBICLENS) 4 % liquid 4 application (has no administration  in time range)  heparin ADULT infusion 100 units/mL (25000 units/222mL) (0 Units/hr Intravenous Stopped 06/04/21 0545)  allopurinol (ZYLOPRIM) tablet 100 mg ( Oral Automatically Held 06/12/21 2200)  atorvastatin (LIPITOR) tablet 40 mg ( Oral Automatically Held 06/12/21 2200)  predniSONE (DELTASONE) tablet 5 mg ( Oral Automatically Held 06/12/21 0800)  polyethylene glycol (MIRALAX / GLYCOLAX) packet 17 g ( Oral MAR Hold 06/04/21 0931)  sevelamer carbonate (RENVELA) tablet 800 mg ( Oral Automatically Held 06/12/21 1700)  HYDROcodone-acetaminophen (NORCO/VICODIN) 5-325 MG per tablet 1 tablet ( Oral MAR Hold 06/04/21 0931)  HYDROmorphone (DILAUDID) injection 0.5 mg ( Intravenous MAR Hold 06/04/21 0931)  insulin aspart (novoLOG) injection 0-9 Units ( Subcutaneous Automatically Held 06/12/21 2000)  Chlorhexidine Gluconate Cloth 2 % PADS 6 each ( Topical Automatically Held 06/12/21 0600)  perflutren lipid microspheres (DEFINITY) IV suspension ( Intravenous MAR Hold 06/04/21 0931)  lactated ringers infusion (has no administration in time range)  0.9 %  sodium chloride infusion ( Intravenous Restarted 39/76/73 4193)  folic acid (FOLVITE) tablet 1 mg (has no administration in time range)  senna-docusate (Senokot-S) tablet 1 tablet (has no administration in time range)  sodium chloride irrigation 0.9 % (500 mLs Irrigation Given 06/04/21 1153)  fentaNYL (SUBLIMAZE) injection 50 mcg (50 mcg Intravenous Given 06/03/21 1629)  ondansetron (ZOFRAN) injection 4 mg (4 mg Intravenous Given 06/03/21 1629)  albuterol (PROVENTIL) (2.5 MG/3ML) 0.083% nebulizer solution (10 mg/hr Nebulization Given 06/03/21 1750)  povidone-iodine 10 % swab 2 application (2 application Topical Given 06/04/21 0948)  ceFAZolin (ANCEF) IVPB 2g/100  mL premix (2 g Intravenous Given 06/04/21 1109)  tranexamic acid (CYKLOKAPRON) IVPB 1,000 mg (1,000 mg Intravenous Given 06/04/21 1120)  ceFAZolin (ANCEF) 2-4 GM/100ML-% IVPB (  Override pull for Anesthesia 06/04/21 1128)  chlorhexidine (PERIDEX) 0.12 % solution 15 mL (15 mLs Mouth/Throat Given 06/04/21 0947)    Or  MEDLINE mouth rinse ( Mouth Rinse See Alternative 06/04/21 0947)  tranexamic acid (CYKLOKAPRON) 1000MG /149mL IVPB (  Override pull for Anesthesia 06/04/21 1128)    ED Course  I have reviewed the triage vital signs and the nursing notes.  Pertinent labs & imaging results that were available during my care of the patient were reviewed by me and considered in my medical decision making (see chart for details).    MDM Rules/Calculators/A&P                            Patient with L hip frx- discussed with Dr. Zachery Dakins. Labs reviewed chem 8 shows elevated BUN/Cr consistent with ESRD- no elevated K. CBC without sig abnormalities. Resp panel neg. Patient xray shows left femoral neck frx and cxr without acute findings. Case discussed w/ Dr. Marlowe Sax who will admit.  Final Clinical Impression(s) / ED Diagnoses Final diagnoses:  Closed fracture of right hip, initial encounter [facility])    Rx / [place] Orders ED Discharge Orders     None        Margarita Mail, PA-C 06/04/21 1214    Truddie Hidden, MD 06/07/21 [phone]

## 2021-06-03 NOTE — ED Notes (Signed)
Patient transported to X-ray 

## 2021-06-03 NOTE — H&P (Signed)
History and Physical    Jonathan [name] HGD:[id number] DOB: 03-18-1951 DOA: 06/03/2021  PCP: Ginger Organ., MD Patient coming from: Home  Chief Complaint: Left hip pain  HPI: Jonathan [name]. is a 70 y.o. 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 fib, vascular dementia.  He presents to the ED today via EMS complaining of left hip pain after a mechanical fall.  Labs showing WBC 7.1, hemoglobin 12.5 (stable), platelet count 113k (was 88k on 03/12/2021).  Patient was a hard stick and not enough blood collected to process BMP.  I-STAT chemistry showing sodium 136, potassium 4.5, chloride 94, bicarb 31, BUN 51, creatinine 8.4, glucose 119.  COVID and influenza PCR negative.  X-ray of left hip/pelvis showing acute mildly displaced left femoral neck fracture.  Chest x-ray showing chronic interstitial lung disease; no acute airspace disease.  Orthopedics consulted.  Patient has dementia.  History provided by mostly by wife at bedside who states this morning after breakfast patient fell while trying to get up from the dining table.  He wears a prosthetic leg and fell due to losing balance.  No head injury or loss of consciousness reported.  Patient complaining of pain in his left hip.  Denies fevers, cough, shortness of breath, chest pain, nausea, vomiting, abdominal pain, or diarrhea.  Wife states he had a heart attack 20 years ago.  Review of Systems:  All systems reviewed and apart from history of presenting illness, are negative.  Past Medical History:  Diagnosis Date   CAD (coronary artery disease)     CHF (congestive heart failure) ([name])    Diabetes mellitus without complication ([place])    type 2   DM (diabetes mellitus) ([place])    ESRD (end stage renal disease) ([place])    FUO (fever of unknown origin) 09/17/2019   Hyperlipidemia    Hypertension    Osteomyelitis ([place])    Peripheral arterial disease ([name])    PVD (peripheral vascular disease) ([place])    Renal disorder 2015   right kidney transplant   Stroke [facility])    Vascular dementia [facility])     Past Surgical History:  Procedure Laterality Date   ABDOMINAL AORTOGRAM N/A 08/05/2020   Procedure: ABDOMINAL AORTOGRAM;  Surgeon: [name], MD;  Location: [place] CV LAB;  Service: Cardiovascular;  Laterality: N/A;   ABDOMINAL AORTOGRAM W/LOWER EXTREMITY N/A 09/09/2020   Procedure: ABDOMINAL AORTOGRAM W/LOWER EXTREMITY;  Surgeon: [name], MD;  Location: [place] CV LAB;  Service: Cardiovascular;  Laterality: N/A;   AMPUTATION Right 08/06/2020   Procedure: Right Fifth Toe Ray Amputation;  Surgeon: [name], MD;  Location: [place];  Service: Vascular;  Laterality: Right;   AMPUTATION Right 11/02/2020   Procedure: RIGHT AMPUTATION BELOW KNEE;  Surgeon: [name], MD;  Location: [place];  Service: Vascular;  Laterality: Right;   BACK SURGERY     Has had 2 back surgeries   BUBBLE STUDY  09/18/2019   Procedure: BUBBLE STUDY;  Surgeon: Buford Dresser, MD;  Location: [facility] ENDOSCOPY;  Service: Cardiovascular;;   Depression     ENDARTERECTOMY FEMORAL Right  08/06/2020   Procedure: Right Ilio- Femoral Artery Endarterectomy;  Surgeon: [name], MD;  Location: [facility] OR;  Service: Vascular;  Laterality: Right;   HAND SURGERY Right    HAND SURGERY     IR REMOVAL TUN CV CATH W/O [facility]  09/03/2019   KIDNEY TRANSPLANT     November 2015   LEG AMPUTATION BELOW KNEE Right    LOWER EXTREMITY ANGIOGRAPHY Bilateral 08/05/2020   Procedure: LOWER EXTREMITY ANGIOGRAPHY;  Surgeon: [name], MD;  Location: [place] CV  LAB;  Service: Cardiovascular;  Laterality: Bilateral;   Memory loss     NEPHRECTOMY TRANSPLANTED ORGAN     PATCH ANGIOPLASTY Right 08/06/2020   Procedure: PATCH ANGIOPLASTY;  Surgeon: [name], MD;  Location: [facility] OR;  Service: Vascular;  Laterality: Right;   PERIPHERAL VASCULAR BALLOON ANGIOPLASTY Right 09/09/2020   Procedure: PERIPHERAL VASCULAR BALLOON ANGIOPLASTY;  Surgeon: [name], MD;  Location: [place] CV LAB;  Service: Cardiovascular;  Laterality: Right;  popliteal   PERIPHERAL VASCULAR INTERVENTION Right 08/05/2020   Procedure: PERIPHERAL VASCULAR INTERVENTION;  Surgeon: [name], MD;  Location: [place] CV LAB;  Service: Cardiovascular;  Laterality: Right;  SFA   PERIPHERAL VASCULAR INTERVENTION Right 09/09/2020   Procedure: PERIPHERAL VASCULAR INTERVENTION;  Surgeon: [name], MD;  Location: [place] CV LAB;  Service: Cardiovascular;  Laterality: Right;  external iliac   TEE WITHOUT CARDIOVERSION N/A 09/18/2019   Procedure: TRANSESOPHAGEAL ECHOCARDIOGRAM (TEE);  Surgeon: Buford Dresser, MD;  Location: [place];  Service: Cardiovascular;  Laterality: N/A;   WOUND DEBRIDEMENT Right 10/08/2020   Procedure: DEBRIDEMENT WOUND RIGHT FOOT;  Surgeon: [name], MD;  Location: [place];  Service: Vascular;  Laterality: Right;     reports that he has quit smoking. He has never used smokeless tobacco. He reports that he does not drink alcohol and does not use drugs.  No Known Allergies  Family History  Problem Relation Age of Onset   Diabetes Mellitus II Mother    Cancer Mother        STOMACH   Diabetes Mellitus II Brother    Other Father        UNKOWN   Healthy Daughter    Diabetes Son     Prior to Admission medications   Medication Sig Start Date End Date Taking? Authorizing Provider  allopurinol (ZYLOPRIM) 100 MG tablet Take 100 mg by mouth at bedtime. 06/09/14  Yes [provider]  apixaban (ELIQUIS) 5 MG TABS  tablet Take 5 mg by mouth 2 (two) times daily.   Yes [provider]  aspirin 81 MG chewable tablet Chew 81 mg by mouth daily.  06/09/14  Yes [provider]  atorvastatin (LIPITOR) 40 MG tablet Take 40 mg by mouth at bedtime.   Yes [provider]  b complex-vitamin c-folic acid (NEPHRO-VITE) 0.8 MG TABS tablet Take 1 tablet by mouth daily. 05/08/20  Yes [provider]  feeding supplement (BOOST HIGH PROTEIN) LIQD Take 1 Container by mouth daily as needed (nutrition).   Yes [provider]  folic acid (FOLVITE) 1 MG tablet Take 1 tablet (1 mg total) by mouth daily. 05/11/21  Yes Garvin Fila, MD  insulin aspart (NOVOLOG) 100 UNIT/ML injection Insulin sliding scale: Blood sugar  120-150   3units                       151-200   4units  201-250   7units                       251- 300  11units                       301-350   15uints                       351-400   20units                       >400         call MD immediately 11/06/20  Yes Florencia Reasons, MD  Insulin Pen Needle 32G X 4 MM MISC 1 each by Other route as needed (insulin).  09/10/16  Yes [provider]  levalbuterol (XOPENEX HFA) 45 MCG/ACT inhaler Inhale 2 puffs into the lungs every 8 (eight) hours as needed for wheezing. 09/21/19  Yes Sheikh, Omair Latif, DO  lidocaine-prilocaine (EMLA) cream Apply 1 application topically 3 (three) times a week. 30 minutes prior to Dialysis - MWF 05/08/18  Yes [provider]  oxyCODONE-acetaminophen (PERCOCET/ROXICET) 5-325 MG tablet Take 1 tablet by mouth every 6 (six) hours as needed for moderate pain. 11/06/20  Yes Florencia Reasons, MD  predniSONE (DELTASONE) 5 MG tablet Take 5 mg by mouth daily with breakfast. 03/02/20  Yes [provider]  senna-docusate (SENOKOT-S) 8.6-50 MG tablet Take 1 tablet by mouth 2 (two) times daily. Hold if diarrhea Patient taking differently: Take 1 tablet by mouth 2 (two) times daily as needed  for mild constipation. 11/06/20  Yes Florencia Reasons, MD  sevelamer carbonate (RENVELA) 800 MG tablet Take 800 mg by mouth 3 (three) times daily with meals. 03/31/20  Yes [provider]  donepezil (ARICEPT) 10 MG tablet Take 1 tablet (10 mg total) by mouth at bedtime. Start half a tablet daily for 4 weeks to be increased to 1 tablet daily if tolerated 05/11/21   Garvin Fila, MD    Physical Exam: Vitals:   06/03/21 2100 06/03/21 2130 06/03/21 2200 06/03/21 2300  BP: (!) 148/65 (!) 146/69 (!) 144/69   Pulse: (!) 102 100 (!) 104   Resp: 17 17 (!) 21   Temp:      SpO2: 95% 98% 95%   Weight:    72.6 kg  Height:    5\' 9"  (1.753 m)    Physical Exam Constitutional:      General: He is not in acute distress. HENT:     Head: Normocephalic and atraumatic.  Eyes:     Extraocular Movements: Extraocular movements intact.     Conjunctiva/sclera: Conjunctivae normal.  Cardiovascular:     Rate and Rhythm: Normal rate and regular rhythm.     Pulses: Normal pulses.  Pulmonary:     Effort: Pulmonary effort is normal. No respiratory distress.     Breath sounds: No wheezing or rales.  Abdominal:     General: Bowel sounds are normal. There is no distension.     Palpations: Abdomen is soft.     Tenderness: There is no abdominal tenderness.  Musculoskeletal:     Cervical back: Normal range of motion and neck supple.     Comments: Right leg BKA Left lower extremity externally rotated.  Dorsalis pedis pulse intact.  Skin:    General: Skin is warm and dry.  Neurological:     General: No focal deficit present.  Mental Status: He is alert and oriented to person, place, and time.     Labs on Admission: I have personally reviewed following labs and imaging studies  CBC: Recent Labs  Lab 06/03/21 1527 06/03/21 1952  WBC 7.1  --   NEUTROABS 5.9  --   HGB 12.5* 13.6  HCT 37.7* 40.0  MCV 95.9  --   PLT 113*  --    Basic Metabolic Panel: Recent Labs  Lab 06/03/21 1952  NA 136  K 4.5   CL 94*  GLUCOSE 119*  BUN 51*  CREATININE 8.40*   GFR: Estimated Creatinine Clearance: 8.4 mL/min (A) (by C-G formula based on SCr of 8.4 mg/dL (H)). Liver Function Tests: No results for input(s): AST, ALT, ALKPHOS, BILITOT, PROT, ALBUMIN in the last 168 hours. No results for input(s): LIPASE, AMYLASE in the last 168 hours. No results for input(s): AMMONIA in the last 168 hours. Coagulation Profile: No results for input(s): INR, PROTIME in the last 168 hours. Cardiac Enzymes: No results for input(s): CKTOTAL, CKMB, CKMBINDEX, TROPONINI in the last 168 hours. BNP (last 3 results) No results for input(s): PROBNP in the last 8760 hours. HbA1C: No results for input(s): HGBA1C in the last 72 hours. CBG: No results for input(s): GLUCAP in the last 168 hours. Lipid Profile: No results for input(s): CHOL, HDL, LDLCALC, TRIG, CHOLHDL, LDLDIRECT in the last 72 hours. Thyroid Function Tests: No results for input(s): TSH, T4TOTAL, FREET4, T3FREE, THYROIDAB in the last 72 hours. Anemia Panel: No results for input(s): VITAMINB12, FOLATE, FERRITIN, TIBC, IRON, RETICCTPCT in the last 72 hours. Urine analysis:    Component Value Date/Time   COLORURINE AMBER (A) 09/11/2019 0534   APPEARANCEUR TURBID (A) 09/11/2019 0534   LABSPEC 1.012 09/11/2019 0534   PHURINE 8.0 09/11/2019 0534   GLUCOSEU NEGATIVE 09/11/2019 0534   HGBUR LARGE (A) 09/11/2019 0534   BILIRUBINUR NEGATIVE 09/11/2019 [place] 09/11/2019 0534   PROTEINUR 100 (A) 09/11/2019 0534   NITRITE NEGATIVE 09/11/2019 0534   LEUKOCYTESUR LARGE (A) 09/11/2019 0534    Radiological Exams on Admission: DG Chest 1 View  Result Date: 06/03/2021 CLINICAL DATA:  Fall with hip fracture EXAM: CHEST  1 VIEW COMPARISON:  09/15/2019, 07/30/2019 FINDINGS: Chronic elevation of left diaphragm. Diffuse bilateral reticular interstitial opacity consistent with chronic lung disease. Mild cardiomegaly with aortic atherosclerosis. No  pleural effusion, pneumothorax, or acute airspace disease. IMPRESSION: Chronic interstitial lung disease.  No acute airspace disease. Electronically Signed   By: Donavan Foil M.D.   On: 06/03/2021 16:00   DG Hip Unilat With Pelvis 2-3 Views Left  Result Date: 06/03/2021 CLINICAL DATA:  Fall EXAM: DG HIP (WITH OR WITHOUT PELVIS) 2-3V LEFT COMPARISON:  None. FINDINGS: Extensive vascular calcifications. Pubic symphysis and rami appear intact. Acute left femoral neck fracture with cranial displacement of trochanter. Femoral head projects in joint. IMPRESSION: Acute mildly displaced left femoral neck fracture Electronically Signed   By: Donavan Foil M.D.   On: 06/03/2021 15:58    EKG: Independently reviewed.  Sinus rhythm, interpretation limited secondary to artifact in several leads.  Repeat EKG ordered and currently pending.  Assessment/Plan Principal Problem:   Hip fracture ([name]) Active Problems:   History of coronary artery disease   ESRD (end stage renal disease) on dialysis ([name])   Hyperlipidemia   CHF (congestive heart failure) ([name])   Acute mildly displaced left femoral neck fracture -Orthopedics consulted and planning on surgery tomorrow morning after medical clearance and optimization.  Patient  is at very high risk for CVA given history of LV thrombus/prior CVA.  Currently on Eliquis at home, transition to IV heparin during perioperative period.  Transthoracic echocardiogram ordered and currently pending.  Keep n.p.o. after midnight.  Pain management: Dilaudid prn severe pain, Norco prn moderate pain  History of LV mural thrombus in 2018 and LV apical thrombus in 2019 History of prior stroke in 07/2019 due to LV thrombus versus paroxysmal A. Fib On Eliquis.  No thrombus seen on TEE done in February 2021. -Very high risk of recurrent CVA, transition to IV heparin during perioperative period.  Transthoracic echocardiogram ordered to assess for possible thrombus and currently pending.   Cardiology consulted.  CAD TEE done 09/18/2019 showing EF 30 to 35%, regional wall motion abnormalities, and severe akinesis of the LV entire apical segment.  No cardiac catheterization results in the chart.  EKG done in the ED showing sinus rhythm although interpretation limited secondary to artifact in several leads.  Patient is not endorsing any chest pain. -Cardiac monitoring.  Repeat EKG.  Check high-sensitivity troponin x2.  TTE ordered.  Given very high risk of perioperative complications, I have consulted cardiology.  Paroxysmal A. Fib Currently in sinus rhythm.  Not on any rate or rhythm control agents. -Hold Eliquis.  Transition to IV heparin during perioperative period.  Chronic systolic CHF EF 23-76% on TEE done 09/18/2019.  No signs of volume overload at this time. -Repeat echo ordered.  Check BNP level.  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  Per wife, he is no longer on Plavix.  Insulin-dependent type 2 diabetes On sliding scale insulin at home, not on long-acting insulin.  A1c 5.7 on 10/30/2020. -Repeat A1c.  Sliding scale insulin sensitive every 4 hours.  ESRD status post failed renal transplant on HD MWF Potassium and bicarb normal.  No signs of volume overload. -Continue home prednisone 5 mg daily.  Check a.m. cortisol level.  Consult nephrology in a.m.  Hyperlipidemia -Continue Lipitor  Mild thrombocytopenia Platelet count slightly improved compared to labs done 03/12/2021. -Continue to monitor  History of gout -Continue allopurinol  DVT prophylaxis: IV Heparin  Code Status: Full code-discussed with the patient and his wife. Family Communication: Wife at bedside. Disposition Plan: Status is: Inpatient  Remains inpatient appropriate because: Needs surgery for hip fracture.  Level of care: Level of care: Telemetry Cardiac  The medical decision making on this patient was of high complexity and the patient is at high risk for clinical  deterioration, therefore this is a level 3 visit.  Shela Leff MD [facility]  If 7PM-7AM, please contact night-coverage [email or web address]  06/04/2021, 12:30 AM

## 2021-06-03 NOTE — ED Notes (Signed)
Phlebotomy attempted to collect blood with no success. IV does not pull back blood at this time. Oncoming phlebotomist to come collect. Provider made aware.

## 2021-06-03 NOTE — Anesthesia Preprocedure Evaluation (Addendum)
Anesthesia Evaluation  Patient identified by MRN, date of birth, ID band Patient awake    Reviewed: Allergy & Precautions, NPO status , Patient's Chart, lab work & pertinent test results  Airway Mallampati: II  TM Distance: >3 FB Neck ROM: Full    Dental no notable dental hx. (+) Teeth Intact, Dental Advisory Given   Pulmonary former smoker,    Pulmonary exam normal breath sounds clear to auscultation       Cardiovascular hypertension, + CAD, + Past MI, + Peripheral Vascular Disease and +CHF  Normal cardiovascular exam Rhythm:Regular Rate:Normal  09/2019 TEE 1. Left ventricular ejection fraction, by estimation, is 30 to 35%. The  left ventricle has severely decreased function. The left ventrical  demonstrates regional wall motion abnormalities (see scoring  diagram/findings for description). There is  moderately increased left ventricular hypertrophy. Left ventricular  diastolic function could not be evaluated. There is severe akinesis of the  left ventricular, entire apical segment.  2. Right ventricular systolic function is normal. The right ventricular  size is normal.  3. No left atrial/left atrial appendage thrombus was detected.  4. The mitral valve is normal in structure and function. Mild mitral  valve regurgitation. No evidence of mitral stenosis.  5. The aortic valve is tricuspid. Aortic valve regurgitation is trivial .  No aortic stenosis is present.  6. There is mild (Grade II) plaque involving the descending aorta.  7. Agitated saline contrast bubble study was negative, with no evidence  of any interatrial shunt.    Neuro/Psych PSYCHIATRIC DISORDERS Dementia CVA, No Residual Symptoms    GI/Hepatic negative GI ROS, (+) Hepatitis -  Endo/Other  diabetes  Renal/GU Dialysis and ESRFRenal diseaseLast dialysis Wednesday Lab Results      Component                Value               Date                       CREATININE               8.40 (H)            06/03/2021                BUN                      51 (H)              06/03/2021                NA                       136                 06/03/2021                K                        4.5                 06/03/2021                CL                       94 (L)  06/03/2021                CO2                      27                  11/06/2020                Musculoskeletal negative musculoskeletal ROS (+)   Abdominal   Peds  Hematology  (+) anemia , Lab Results      Component                Value               Date                      WBC                      7.1                 06/03/2021                HGB                      13.6                06/03/2021                HCT                      40.0                06/03/2021                MCV                      95.9                06/03/2021                PLT                      113 (L)             06/03/2021              Anesthesia Other Findings   Reproductive/Obstetrics                           Anesthesia Physical Anesthesia Plan  ASA: 4  Anesthesia Plan: General   Post-op Pain Management:    Induction: Intravenous  PONV Risk Score and Plan: 3 and Treatment may vary due to age or medical condition and Ondansetron  Airway Management Planned: Oral ETT  Additional Equipment: None  Intra-op Plan:   Post-operative Plan: Extubation in OR  Informed Consent: I have reviewed the patients History and Physical, chart, labs and discussed the procedure including the risks, benefits and alternatives for the proposed anesthesia with the patient or authorized representative who has indicated his/her understanding and acceptance.     Dental advisory given  Plan Discussed with: CRNA and Anesthesiologist  Anesthesia Plan Comments: ([place] )       Anesthesia Quick Evaluation

## 2021-06-03 NOTE — Progress Notes (Signed)
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  No Known Allergies  Patient Measurements: Height: 5\' 9"  (175.3 cm) Weight: 72.6 kg (160 lb) IBW/kg (Calculated) : 70.7  Vital Signs: Temp: 98.6 F (37 C) (10/27 1615) BP: 144/69 (10/27 2200) Pulse Rate: 104 (10/27 2200)  Labs: Recent Labs    06/03/21 1527 06/03/21 1952  HGB 12.5* 13.6  HCT 37.7* 40.0  PLT 113*  --   CREATININE  --  8.40*    Estimated Creatinine Clearance: 8.4 mL/min (A) (by C-G formula based on SCr of 8.4 mg/dL (H)).   Medical History: Past Medical History:  Diagnosis Date   CAD (coronary artery disease)    CHF (congestive heart failure) ([name])    Diabetes mellitus without complication ([place])    type 2   DM (diabetes mellitus) ([place])    ESRD (end stage renal disease) ([place])    FUO (fever of unknown origin) 09/17/2019   Hyperlipidemia    Hypertension    Osteomyelitis ([place])    Peripheral arterial disease ([place])    PVD (peripheral vascular disease) ([place])    Renal disorder 2015   right kidney transplant   Stroke [facility])    Vascular dementia [facility])     Assessment: 70yo male fell while attempting to mobilize with new RLE prosthesis >> sustained displaced femoral neck fracture >> awaiting surgical intervention, to begin heparin bridge from Eliquis for h/o stroke d/t LV thrombosis; last dose of Eliquis taken 10/27 1130a.  Goal of Therapy:  Heparin level 0.3-0.7 units/ml aPTT 66-102 seconds Monitor platelets by anticoagulation protocol: Yes   Plan:  Heparin infusion at 1000 units/hr and monitor heparin levels, aPTT (while Eliquis affects anti-Xa level), and CBC.  Wynona Neat, PharmD, BCPS  06/03/2021,11:50 PM

## 2021-06-03 NOTE — Consult Note (Signed)
ORTHOPAEDIC CONSULTATION  REQUESTING PHYSICIAN: Shela Leff, MD  Chief Complaint:  left hip fracture  HPI: Jonathan [name]. is a 70 y.o. male with medical history significant for vascular dementia, or type 2 diabetes, CKD on dialysis, history of osteomyelitis requiring right below-knee amputation who presents today after a fall at home.  [name]  Upon following had immediate pain in the left hip and inability to ambulate.  He was brought to the emergency room and x-rays demonstrated displaced left femoral neck fracture.  His wife is very concerned about his ability to be safe at home after surgery.  She is interested in pursuing a nursing home for him given his cognitive state and recent physical decline.  His right leg amputation was performed earlier this year and he has only recently been limited to ambulate with the new right leg prosthesis.  The patient is oriented to self and location. he denies any pain at this time.  Past Medical History:  Diagnosis Date   CAD (coronary artery disease)    CHF (congestive heart failure) ([name])    Diabetes mellitus without complication ([place])    type 2   DM (diabetes mellitus) ([place])    ESRD (end stage renal disease) ([place])    FUO (fever of unknown origin) 09/17/2019   Hyperlipidemia    Hypertension    Osteomyelitis ([place])    Peripheral arterial disease ([name])    PVD (peripheral vascular disease) ([place])    Renal disorder 2015   right kidney transplant   Stroke [facility])    Vascular dementia [facility])    Past Surgical History:  Procedure Laterality Date   ABDOMINAL AORTOGRAM N/A 08/05/2020   Procedure: ABDOMINAL AORTOGRAM;  Surgeon: [name], MD;  Location: [place] [facility];  Service: Cardiovascular;  Laterality: N/A;   ABDOMINAL AORTOGRAM W/LOWER EXTREMITY N/A 09/09/2020   Procedure: ABDOMINAL AORTOGRAM W/LOWER EXTREMITY;  Surgeon: [name],  MD;  Location: [place] [facility];  Service: Cardiovascular;  Laterality: N/A;   AMPUTATION Right 08/06/2020   Procedure: Right Fifth Toe Ray Amputation;  Surgeon: [name], MD;  Location: [place];  Service: Vascular;  Laterality: Right;   AMPUTATION Right 11/02/2020   Procedure: RIGHT AMPUTATION BELOW KNEE;  Surgeon: [name], MD;  Location: [place];  Service: Vascular;  Laterality: Right;   BACK SURGERY     Has had 2 back surgeries   BUBBLE STUDY  09/18/2019   Procedure: BUBBLE STUDY;  Surgeon: Buford Dresser, MD;  Location: [place];  Service: Cardiovascular;;   Depression     ENDARTERECTOMY FEMORAL Right 08/06/2020   Procedure: Right Ilio- Femoral Artery Endarterectomy;  Surgeon: [name], MD;  Location: [facility] OR;  Service: Vascular;  Laterality: Right;   HAND SURGERY Right    HAND SURGERY     IR REMOVAL TUN CV CATH W/O [facility]  09/03/2019   KIDNEY TRANSPLANT     November 2015   LEG AMPUTATION BELOW KNEE Right    LOWER EXTREMITY ANGIOGRAPHY Bilateral 08/05/2020   Procedure: LOWER EXTREMITY ANGIOGRAPHY;  Surgeon: [name], MD;  Location: [place] [facility];  Service: Cardiovascular;  Laterality: Bilateral;   Memory loss     NEPHRECTOMY TRANSPLANTED ORGAN     PATCH ANGIOPLASTY Right 08/06/2020   Procedure: PATCH ANGIOPLASTY;  Surgeon: [name], MD;  Location: [place];  Service: Vascular;  Laterality: Right;  PERIPHERAL VASCULAR BALLOON ANGIOPLASTY Right 09/09/2020   Procedure: PERIPHERAL VASCULAR BALLOON ANGIOPLASTY;  Surgeon: [name], MD;  Location: [place] [facility];  Service: Cardiovascular;  Laterality: Right;  popliteal   PERIPHERAL VASCULAR INTERVENTION Right 08/05/2020   Procedure: PERIPHERAL VASCULAR INTERVENTION;  Surgeon: [name], MD;  Location: [place] [facility];  Service: Cardiovascular;  Laterality: Right;  SFA   PERIPHERAL VASCULAR INTERVENTION Right 09/09/2020   Procedure: PERIPHERAL VASCULAR INTERVENTION;   Surgeon: [name], MD;  Location: [place] [facility];  Service: Cardiovascular;  Laterality: Right;  external iliac   TEE WITHOUT CARDIOVERSION N/A 09/18/2019   Procedure: TRANSESOPHAGEAL ECHOCARDIOGRAM (TEE);  Surgeon: Buford Dresser, MD;  Location: [facility] ENDOSCOPY;  Service: Cardiovascular;  Laterality: N/A;   WOUND DEBRIDEMENT Right 10/08/2020   Procedure: DEBRIDEMENT WOUND RIGHT FOOT;  Surgeon: [name], MD;  Location: [facility] OR;  Service: Vascular;  Laterality: Right;   Social History   Socioeconomic History   Marital status: Married    Spouse name: Jonathan [name]   Number of children: Not on file   Years of education: Not on file   Highest education level: Not on file  Occupational History   Not on file  Tobacco Use   Smoking status: Former   Smokeless tobacco: Never  Vaping Use   Vaping Use: Never used  Substance and Sexual Activity   Alcohol use: Never   Drug use: Never   Sexual activity: Not on file  Other Topics Concern   Not on file  Social History Narrative   Lives with wife, son, daughter in law and grandson   Right Handed   Drinks 1 cup caffeine daily   Social Determinants of Health   Financial Resource Strain: Not on file  Food Insecurity: Not on file  Transportation Needs: Not on file  Physical Activity: Not on file  Stress: Not on file  Social Connections: Not on file   Family History  Problem Relation Age of Onset   Diabetes Mellitus II Mother    Cancer Mother        STOMACH   Diabetes Mellitus II Brother    Other Father        UNKOWN   Healthy Daughter    Diabetes Son    No Known Allergies   Positive ROS: All other systems have been reviewed and were otherwise negative with the exception of those mentioned in the HPI and as above.  Physical Exam: General: Alert, no acute distress Cardiovascular: No pedal edema Respiratory: No cyanosis, no use of accessory musculature GI: No organomegaly, abdomen is soft and non-tender Skin:  No lesions in the area of chief complaint Neurologic: Sensation intact distally Psychiatric: Patient is competent for consent with normal mood and affect Lymphatic: No axillary or cervical lymphadenopathy  MUSCULOSKELETAL:  LLE No traumatic wounds, ecchymosis, or rash  Nontender  No knee or ankle effusion  Sens DPN, SPN, TN intact  Motor EHL, ext, flex, evers 5/5  DP 2+, PT 2+, No significant edema  RLE No traumatic wounds, ecchymosis, or rash  Nontender  No knee effusion        IMAGING: X-rays of the left hip and pelvis demonstrate a displaced femoral neck fracture  Assessment: Active Problems:   Hip fracture ([name])   Left displaced femoral neck fracture  Plan: Had a good discussion with the patient and wife.  Given the displacement of his traumatic fracture not amenable to operative fixation.  Discussed both total hip arthroplasty versus  hip hemiarthroplasty.  Given his current medical comorbidities, cognitive state, physical function level I feel that he is a better and safer candidate for hip hemiarthroplasty if he is already at a high risk for perioperative complication.  Risks of surgery including infection, nerve injury, bleeding, fracture and need for additional surgery were discussed with the patient and his wife.  All questions were answered.  We will plan to proceed with surgery tomorrow morning medical clearance and optimization.    Willaim Sheng, MD Cell [phone]

## 2021-06-03 NOTE — ED Triage Notes (Signed)
Pt here via EMS from home with c/o left hip pain. Pt was standing at kitchen table. New right BKA prosthetic, and lost balance. No LOC No other signs of injury.  Baseline dementia.  HD pt. M/W/F. Right arm restricted  164/83 HR 90 RR 16 96% RA CBG 131

## 2021-06-04 ENCOUNTER — Encounter (HOSPITAL_COMMUNITY)
Admission: EM | Disposition: A | Discharge status: Rehabilitation Facility | Payer: Self-pay | Source: Home / Self Care | Attending: Family Medicine

## 2021-06-04 ENCOUNTER — Encounter (HOSPITAL_COMMUNITY): Payer: Self-pay | Admitting: Internal Medicine

## 2021-06-04 ENCOUNTER — Inpatient Hospital Stay (HOSPITAL_COMMUNITY): Payer: Medicare Other | Admitting: Anesthesiology

## 2021-06-04 ENCOUNTER — Inpatient Hospital Stay (HOSPITAL_COMMUNITY): Payer: Medicare Other

## 2021-06-04 ENCOUNTER — Other Ambulatory Visit: Payer: Self-pay

## 2021-06-04 DIAGNOSIS — Z01810 Encounter for preprocedural cardiovascular examination: Secondary | ICD-10-CM

## 2021-06-04 DIAGNOSIS — I5022 Chronic systolic (congestive) heart failure: Secondary | ICD-10-CM | POA: Diagnosis not present

## 2021-06-04 DIAGNOSIS — S72002A Fracture of unspecified part of neck of left femur, initial encounter for closed fracture: Secondary | ICD-10-CM | POA: Diagnosis not present

## 2021-06-04 DIAGNOSIS — I5042 Chronic combined systolic (congestive) and diastolic (congestive) heart failure: Secondary | ICD-10-CM | POA: Diagnosis not present

## 2021-06-04 DIAGNOSIS — I509 Heart failure, unspecified: Secondary | ICD-10-CM

## 2021-06-04 HISTORY — PX: HIP ARTHROPLASTY: SHX981

## 2021-06-04 LAB — ECHOCARDIOGRAM COMPLETE
Area-P 1/2: 4.71 cm2
Calc EF: 28.4 %
Height: 69 in
MV M vel: 6.37 m/s
MV Peak grad: 162.3 mmHg
Radius: 0.9 cm
S' Lateral: 4.2 cm
Single Plane A2C EF: 32.7 %
Single Plane A4C EF: 21.5 %
Weight: 2560 oz

## 2021-06-04 LAB — SURGICAL PCR SCREEN
MRSA, PCR: NEGATIVE
Staphylococcus aureus: NEGATIVE

## 2021-06-04 LAB — CBC
HCT: 31.6 % — ABNORMAL LOW (ref 39.0–52.0)
HCT: 29.8 % — ABNORMAL LOW (ref 39.0–52.0)
Hemoglobin: 10.2 g/dL — ABNORMAL LOW (ref 13.0–17.0)
Hemoglobin: 9.6 g/dL — ABNORMAL LOW (ref 13.0–17.0)
MCH: 31 pg (ref 26.0–34.0)
MCH: 31.5 pg (ref 26.0–34.0)
MCHC: 32.3 g/dL (ref 30.0–36.0)
MCHC: 32.2 g/dL (ref 30.0–36.0)
MCV: 96 fL (ref 80.0–100.0)
MCV: 97.7 fL (ref 80.0–100.0)
Platelets: 164 10*3/uL (ref 150–400)
Platelets: 157 10*3/uL (ref 150–400)
RBC: 3.29 MIL/uL — ABNORMAL LOW (ref 4.22–5.81)
RBC: 3.05 MIL/uL — ABNORMAL LOW (ref 4.22–5.81)
RDW: 17.5 % — ABNORMAL HIGH (ref 11.5–15.5)
RDW: 17.7 % — ABNORMAL HIGH (ref 11.5–15.5)
WBC: 6.9 10*3/uL (ref 4.0–10.5)
WBC: 6.7 10*3/uL (ref 4.0–10.5)
nRBC: 0 % (ref 0.0–0.2)
nRBC: 0 % (ref 0.0–0.2)

## 2021-06-04 LAB — BASIC METABOLIC PANEL
Anion gap: 12 (ref 5–15)
Anion gap: 16 — ABNORMAL HIGH (ref 5–15)
BUN: 62 mg/dL — ABNORMAL HIGH (ref 8–23)
BUN: 64 mg/dL — ABNORMAL HIGH (ref 8–23)
CO2: 30 mmol/L (ref 22–32)
CO2: 25 mmol/L (ref 22–32)
Calcium: 8.8 mg/dL — ABNORMAL LOW (ref 8.9–10.3)
Calcium: 8.7 mg/dL — ABNORMAL LOW (ref 8.9–10.3)
Chloride: 95 mmol/L — ABNORMAL LOW (ref 98–111)
Chloride: 94 mmol/L — ABNORMAL LOW (ref 98–111)
Creatinine, Ser: 8.83 mg/dL — ABNORMAL HIGH (ref 0.61–1.24)
Creatinine, Ser: 9.07 mg/dL — ABNORMAL HIGH (ref 0.61–1.24)
GFR, Estimated: 6 mL/min — ABNORMAL LOW (ref >60)
GFR, Estimated: 6 mL/min — ABNORMAL LOW (ref >60)
Glucose, Bld: 101 mg/dL — ABNORMAL HIGH (ref 70–99)
Glucose, Bld: 100 mg/dL — ABNORMAL HIGH (ref 70–99)
Potassium: 4.7 mmol/L (ref 3.5–5.1)
Potassium: 4.7 mmol/L (ref 3.5–5.1)
Sodium: 137 mmol/L (ref 135–145)
Sodium: 135 mmol/L (ref 135–145)

## 2021-06-04 LAB — CBG MONITORING, ED: Glucose-Capillary: 105 mg/dL — ABNORMAL HIGH (ref 70–99)

## 2021-06-04 LAB — GLUCOSE, CAPILLARY
Glucose-Capillary: 100 mg/dL — ABNORMAL HIGH (ref 70–99)
Glucose-Capillary: 96 mg/dL (ref 70–99)
Glucose-Capillary: 102 mg/dL — ABNORMAL HIGH (ref 70–99)
Glucose-Capillary: 103 mg/dL — ABNORMAL HIGH (ref 70–99)
Glucose-Capillary: 99 mg/dL (ref 70–99)

## 2021-06-04 LAB — BRAIN NATRIURETIC PEPTIDE: B Natriuretic Peptide: 2665.2 pg/mL — ABNORMAL HIGH (ref 0.0–100.0)

## 2021-06-04 LAB — HEMOGLOBIN A1C
Hgb A1c MFr Bld: 5.3 % (ref 4.8–5.6)
Mean Plasma Glucose: 105.41 mg/dL

## 2021-06-04 LAB — TROPONIN I (HIGH SENSITIVITY)
Troponin I (High Sensitivity): 80 ng/L — ABNORMAL HIGH (ref <18)
Troponin I (High Sensitivity): 75 ng/L — ABNORMAL HIGH (ref <18)

## 2021-06-04 LAB — PROTIME-INR
INR: 1.3 — ABNORMAL HIGH (ref 0.8–1.2)
Prothrombin Time: 16.4 seconds — ABNORMAL HIGH (ref 11.4–15.2)

## 2021-06-04 LAB — HEPARIN LEVEL (UNFRACTIONATED): Heparin Unfractionated: >1.1 IU/mL — ABNORMAL HIGH (ref 0.30–0.70)

## 2021-06-04 LAB — CORTISOL-AM, BLOOD: Cortisol - AM: 26.2 ug/dL — ABNORMAL HIGH (ref 6.7–22.6)

## 2021-06-04 LAB — APTT: aPTT: 40 seconds — ABNORMAL HIGH (ref 24–36)

## 2021-06-04 SURGERY — HEMIARTHROPLASTY, HIP, DIRECT ANTERIOR APPROACH, FOR FRACTURE
Anesthesia: General | Site: Hip | Laterality: Left

## 2021-06-04 MED ORDER — PREDNISONE 5 MG PO TABS
5.0000 mg | ORAL_TABLET | Freq: Every day | ORAL | Status: DC
  Administered 2021-06-05 – 2021-06-10 (×4): 5 mg via ORAL
  Filled 2021-06-04 (×4): qty 1

## 2021-06-04 MED ORDER — ROCURONIUM BROMIDE 10 MG/ML (PF) SYRINGE
PREFILLED_SYRINGE | INTRAVENOUS | Status: DC | PRN
  Administered 2021-06-04: 20 mg via INTRAVENOUS
  Administered 2021-06-04: 80 mg via INTRAVENOUS

## 2021-06-04 MED ORDER — SODIUM CHLORIDE 0.9 % IV SOLN: INTRAVENOUS | Status: DC

## 2021-06-04 MED ORDER — FENTANYL CITRATE (PF) 250 MCG/5ML IJ SOLN
INTRAMUSCULAR | Status: AC
  Filled 2021-06-04: qty 5

## 2021-06-04 MED ORDER — HYDROCODONE-ACETAMINOPHEN 5-325 MG PO TABS
1.0000 | ORAL_TABLET | Freq: Four times a day (QID) | ORAL | Status: DC | PRN
  Administered 2021-06-05 – 2021-06-08 (×5): 1 via ORAL
  Filled 2021-06-04 (×5): qty 1

## 2021-06-04 MED ORDER — DEXAMETHASONE SODIUM PHOSPHATE 10 MG/ML IJ SOLN
INTRAMUSCULAR | Status: DC | PRN
  Administered 2021-06-04: 5 mg via INTRAVENOUS

## 2021-06-04 MED ORDER — TRANEXAMIC ACID-NACL 1000-0.7 MG/100ML-% IV SOLN
INTRAVENOUS | Status: AC
  Filled 2021-06-04: qty 100

## 2021-06-04 MED ORDER — ATORVASTATIN CALCIUM 40 MG PO TABS
40.0000 mg | ORAL_TABLET | Freq: Every day | ORAL | Status: DC
  Administered 2021-06-04 – 2021-06-09 (×6): 40 mg via ORAL
  Filled 2021-06-04 (×6): qty 1

## 2021-06-04 MED ORDER — CHLORHEXIDINE GLUCONATE 4 % EX LIQD: 60.0000 mL | Freq: Once | CUTANEOUS | Status: DC

## 2021-06-04 MED ORDER — PERFLUTREN LIPID MICROSPHERE
1.0000 mL | INTRAVENOUS | Status: AC | PRN
  Administered 2021-06-04: 4 mL via INTRAVENOUS
  Filled 2021-06-04: qty 10

## 2021-06-04 MED ORDER — RENA-VITE PO TABS
1.0000 | ORAL_TABLET | Freq: Every day | ORAL | Status: DC
  Administered 2021-06-04 – 2021-06-09 (×6): 1 via ORAL
  Filled 2021-06-04 (×6): qty 1

## 2021-06-04 MED ORDER — FOLIC ACID 1 MG PO TABS
1.0000 mg | ORAL_TABLET | Freq: Every day | ORAL | Status: DC
  Administered 2021-06-05 – 2021-06-10 (×4): 1 mg via ORAL
  Filled 2021-06-04 (×4): qty 1

## 2021-06-04 MED ORDER — ROCURONIUM BROMIDE 10 MG/ML (PF) SYRINGE
PREFILLED_SYRINGE | INTRAVENOUS | Status: AC
  Filled 2021-06-04: qty 10

## 2021-06-04 MED ORDER — ALLOPURINOL 100 MG PO TABS
100.0000 mg | ORAL_TABLET | Freq: Every day | ORAL | Status: DC
  Administered 2021-06-04 – 2021-06-09 (×6): 100 mg via ORAL
  Filled 2021-06-04 (×6): qty 1

## 2021-06-04 MED ORDER — HYDROMORPHONE HCL 1 MG/ML IJ SOLN
0.5000 mg | INTRAMUSCULAR | Status: DC | PRN
Start: 2021-06-04 — End: 2021-06-10
  Administered 2021-06-07 – 2021-06-09 (×2): 0.5 mg via INTRAVENOUS
  Filled 2021-06-04 (×2): qty 0.5

## 2021-06-04 MED ORDER — SEVELAMER CARBONATE 800 MG PO TABS
800.0000 mg | ORAL_TABLET | Freq: Three times a day (TID) | ORAL | Status: DC
  Administered 2021-06-05 – 2021-06-10 (×11): 800 mg via ORAL
  Filled 2021-06-04 (×9): qty 1

## 2021-06-04 MED ORDER — POVIDONE-IODINE 10 % EX SWAB
2.0000 "application " | Freq: Once | CUTANEOUS | Status: AC
  Administered 2021-06-04: 2 via TOPICAL

## 2021-06-04 MED ORDER — PHENYLEPHRINE HCL-NACL 20-0.9 MG/250ML-% IV SOLN
INTRAVENOUS | Status: DC | PRN
  Administered 2021-06-04: 25 ug/min via INTRAVENOUS

## 2021-06-04 MED ORDER — FENTANYL CITRATE (PF) 250 MCG/5ML IJ SOLN
INTRAMUSCULAR | Status: DC | PRN
  Administered 2021-06-04 (×5): 50 ug via INTRAVENOUS

## 2021-06-04 MED ORDER — INSULIN ASPART 100 UNIT/ML IJ SOLN
0.0000 [IU] | INTRAMUSCULAR | Status: DC
  Administered 2021-06-05 (×3): 2 [IU] via SUBCUTANEOUS
  Administered 2021-06-06: 1 [IU] via SUBCUTANEOUS
  Administered 2021-06-06 (×2): 2 [IU] via SUBCUTANEOUS
  Administered 2021-06-06: 3 [IU] via SUBCUTANEOUS
  Administered 2021-06-07 – 2021-06-08 (×3): 2 [IU] via SUBCUTANEOUS
  Administered 2021-06-08: 5 [IU] via SUBCUTANEOUS
  Administered 2021-06-08 (×2): 2 [IU] via SUBCUTANEOUS
  Administered 2021-06-08: 3 [IU] via SUBCUTANEOUS
  Administered 2021-06-09: 1 [IU] via SUBCUTANEOUS
  Administered 2021-06-09: 2 [IU] via SUBCUTANEOUS
  Administered 2021-06-10 (×2): 1 [IU] via SUBCUTANEOUS

## 2021-06-04 MED ORDER — ONDANSETRON HCL 4 MG/2ML IJ SOLN
INTRAMUSCULAR | Status: AC
  Filled 2021-06-04: qty 2

## 2021-06-04 MED ORDER — CEFAZOLIN SODIUM-DEXTROSE 2-4 GM/100ML-% IV SOLN
2.0000 g | INTRAVENOUS | Status: AC
  Administered 2021-06-04: 2 g via INTRAVENOUS

## 2021-06-04 MED ORDER — DEXAMETHASONE SODIUM PHOSPHATE 10 MG/ML IJ SOLN
INTRAMUSCULAR | Status: AC
  Filled 2021-06-04: qty 1

## 2021-06-04 MED ORDER — TRANEXAMIC ACID-NACL 1000-0.7 MG/100ML-% IV SOLN
1000.0000 mg | INTRAVENOUS | Status: AC
  Administered 2021-06-04: 1000 mg via INTRAVENOUS

## 2021-06-04 MED ORDER — POLYETHYLENE GLYCOL 3350 17 G PO PACK: 17.0000 g | PACK | Freq: Every day | ORAL | Status: DC | PRN

## 2021-06-04 MED ORDER — PROPOFOL 10 MG/ML IV BOLUS
INTRAVENOUS | Status: DC | PRN
  Administered 2021-06-04: 100 mg via INTRAVENOUS

## 2021-06-04 MED ORDER — CHLORHEXIDINE GLUCONATE 0.12 % MT SOLN: 15.0000 mL | Freq: Once | OROMUCOSAL | Status: AC

## 2021-06-04 MED ORDER — MIDAZOLAM HCL 2 MG/2ML IJ SOLN
INTRAMUSCULAR | Status: AC
  Filled 2021-06-04: qty 2

## 2021-06-04 MED ORDER — SODIUM CHLORIDE 0.9 % IR SOLN
Status: DC | PRN
  Administered 2021-06-04: 500 mL
  Administered 2021-06-04: 3000 mL

## 2021-06-04 MED ORDER — PROPOFOL 10 MG/ML IV BOLUS
INTRAVENOUS | Status: AC
  Filled 2021-06-04: qty 40

## 2021-06-04 MED ORDER — CEFAZOLIN SODIUM-DEXTROSE 2-4 GM/100ML-% IV SOLN
2.0000 g | Freq: Once | INTRAVENOUS | Status: AC
  Administered 2021-06-05: 2 g via INTRAVENOUS
  Filled 2021-06-04: qty 100

## 2021-06-04 MED ORDER — EPHEDRINE SULFATE-NACL 50-0.9 MG/10ML-% IV SOSY: PREFILLED_SYRINGE | INTRAVENOUS | Status: DC | PRN

## 2021-06-04 MED ORDER — 0.9 % SODIUM CHLORIDE (POUR BTL) OPTIME
TOPICAL | Status: DC | PRN
  Administered 2021-06-04: 1000 mL

## 2021-06-04 MED ORDER — CHLORHEXIDINE GLUCONATE 0.12 % MT SOLN
OROMUCOSAL | Status: AC
  Administered 2021-06-04: 15 mL via OROMUCOSAL
  Filled 2021-06-04: qty 15

## 2021-06-04 MED ORDER — APIXABAN 5 MG PO TABS
5.0000 mg | ORAL_TABLET | Freq: Two times a day (BID) | ORAL | Status: DC
  Filled 2021-06-04 (×2): qty 1

## 2021-06-04 MED ORDER — SENNOSIDES-DOCUSATE SODIUM 8.6-50 MG PO TABS: 1.0000 | ORAL_TABLET | Freq: Two times a day (BID) | ORAL | Status: DC | PRN

## 2021-06-04 MED ORDER — ENSURE ENLIVE PO LIQD
237.0000 mL | Freq: Three times a day (TID) | ORAL | Status: DC
  Administered 2021-06-04 – 2021-06-10 (×11): 237 mL via ORAL

## 2021-06-04 MED ORDER — MIDODRINE HCL 5 MG PO TABS
ORAL_TABLET | ORAL | Status: AC
  Administered 2021-06-04: 5 mg
  Filled 2021-06-04: qty 1

## 2021-06-04 MED ORDER — PHENYLEPHRINE 40 MCG/ML (10ML) SYRINGE FOR IV PUSH (FOR BLOOD PRESSURE SUPPORT)
PREFILLED_SYRINGE | INTRAVENOUS | Status: DC | PRN
  Administered 2021-06-04: 120 ug via INTRAVENOUS
  Administered 2021-06-04: 80 ug via INTRAVENOUS

## 2021-06-04 MED ORDER — ONDANSETRON HCL 4 MG/2ML IJ SOLN
INTRAMUSCULAR | Status: DC | PRN
  Administered 2021-06-04: 4 mg via INTRAVENOUS

## 2021-06-04 MED ORDER — PHENYLEPHRINE HCL (PRESSORS) 10 MG/ML IV SOLN
INTRAVENOUS | Status: AC
  Filled 2021-06-04: qty 2

## 2021-06-04 MED ORDER — SUGAMMADEX SODIUM 200 MG/2ML IV SOLN
INTRAVENOUS | Status: DC | PRN
  Administered 2021-06-04: 200 mg via INTRAVENOUS

## 2021-06-04 MED ORDER — LIDOCAINE 2% (20 MG/ML) 5 ML SYRINGE
INTRAMUSCULAR | Status: AC
  Filled 2021-06-04: qty 5

## 2021-06-04 MED ORDER — CEFAZOLIN SODIUM-DEXTROSE 2-4 GM/100ML-% IV SOLN
INTRAVENOUS | Status: AC
  Filled 2021-06-04: qty 100

## 2021-06-04 MED ORDER — ORAL CARE MOUTH RINSE: 15.0000 mL | Freq: Once | OROMUCOSAL | Status: AC

## 2021-06-04 MED ORDER — CHLORHEXIDINE GLUCONATE CLOTH 2 % EX PADS
6.0000 | MEDICATED_PAD | Freq: Every day | CUTANEOUS | Status: DC
  Administered 2021-06-04 – 2021-06-10 (×6): 6 via TOPICAL

## 2021-06-04 MED ORDER — LACTATED RINGERS IV SOLN: INTRAVENOUS | Status: DC

## 2021-06-04 MED ORDER — APIXABAN 2.5 MG PO TABS
2.5000 mg | ORAL_TABLET | Freq: Two times a day (BID) | ORAL | Status: AC
  Administered 2021-06-05 – 2021-06-06 (×4): 2.5 mg via ORAL
  Filled 2021-06-04 (×5): qty 1

## 2021-06-04 MED ORDER — LIDOCAINE 2% (20 MG/ML) 5 ML SYRINGE
INTRAMUSCULAR | Status: DC | PRN
  Administered 2021-06-04: 40 mg via INTRAVENOUS

## 2021-06-04 SURGICAL SUPPLY — 71 items
ADH SKN CLS APL DERMABOND .7 (GAUZE/BANDAGES/DRESSINGS) ×1
BAG COUNTER SPONGE SURGICOUNT (BAG) ×2 IMPLANT
BAG SPNG CNTER NS LX DISP (BAG) ×1
BLADE SAW SAG 73X25 THK (BLADE) ×2
BLADE SAW SGTL 73X25 THK (BLADE) ×2 IMPLANT
BRUSH FEMORAL CANAL (MISCELLANEOUS) ×2 IMPLANT
BRUSH SCRUB EZ PLAIN DRY (MISCELLANEOUS) ×4 IMPLANT
CEMENT BONE SIMPLEX SPEEDSET (Cement) ×4 IMPLANT
COVER SURGICAL LIGHT HANDLE (MISCELLANEOUS) ×2 IMPLANT
DERMABOND ADVANCED (GAUZE/BANDAGES/DRESSINGS) ×1
DERMABOND ADVANCED .7 DNX12 (GAUZE/BANDAGES/DRESSINGS) ×1 IMPLANT
DRAPE EXTREMITY T 121X128X90 (DISPOSABLE) ×2 IMPLANT
DRAPE INCISE IOBAN 85X60 (DRAPES) ×4 IMPLANT
DRAPE ORTHO SPLIT 77X108 STRL (DRAPES) ×4
DRAPE STERI 35X30 U-POUCH (DRAPES) ×2 IMPLANT
DRAPE SURG ORHT 6 SPLT 77X108 (DRAPES) ×2 IMPLANT
DRAPE U-SHAPE 47X51 STRL (DRAPES) ×2 IMPLANT
DRSG AQUACEL AG ADV 3.5X10 (GAUZE/BANDAGES/DRESSINGS) ×2 IMPLANT
DRSG MEPILEX BORDER 4X8 (GAUZE/BANDAGES/DRESSINGS) ×2 IMPLANT
ELECT BLADE 6.5 EXT (BLADE) IMPLANT
ELECT CAUTERY BLADE 6.4 (BLADE) ×2 IMPLANT
ELECT REM PT RETURN 9FT ADLT (ELECTROSURGICAL) ×2
ELECTRODE REM PT RTRN 9FT ADLT (ELECTROSURGICAL) ×1 IMPLANT
GLOVE SRG 8 PF TXTR STRL LF DI (GLOVE) ×2 IMPLANT
GLOVE SURG ENC MOIS LTX SZ7.5 (GLOVE) ×2 IMPLANT
GLOVE SURG ENC MOIS LTX SZ8 (GLOVE) ×2 IMPLANT
GLOVE SURG UNDER POLY LF SZ8 (GLOVE) ×4
GOWN STRL REUS W/ TWL LRG LVL3 (GOWN DISPOSABLE) ×1 IMPLANT
GOWN STRL REUS W/ TWL XL LVL3 (GOWN DISPOSABLE) ×1 IMPLANT
GOWN STRL REUS W/TWL LRG LVL3 (GOWN DISPOSABLE) ×2
GOWN STRL REUS W/TWL XL LVL3 (GOWN DISPOSABLE) ×2
HANDPIECE INTERPULSE COAX TIP (DISPOSABLE) ×4
HEAD BIOLOX HIP 28/+4 (Joint) ×1 IMPLANT
HEAD BIPOLAR LOCK UHR 28X52 (Head) ×2 IMPLANT
HEAD FEM LFIT V40 28MM +8 (Head) ×2 IMPLANT
HIP BIOLOX HD 28/+4 (Joint) ×2 IMPLANT
KIT BASIN OR (CUSTOM PROCEDURE TRAY) ×2 IMPLANT
KIT BONE PREP STRYKER (KITS) ×1
KIT TURNOVER KIT B (KITS) ×2 IMPLANT
MANIFOLD NEPTUNE II (INSTRUMENTS) ×4 IMPLANT
NEEDLE 1/2 CIR MAYO (NEEDLE) IMPLANT
NS IRRIG 1000ML POUR BTL (IV SOLUTION) ×2 IMPLANT
PACK TOTAL JOINT (CUSTOM PROCEDURE TRAY) ×2 IMPLANT
PAD ARMBOARD 7.5X6 YLW CONV (MISCELLANEOUS) ×4 IMPLANT
PILLOW ABDUCTION MEDIUM (MISCELLANEOUS) IMPLANT
PRESSURIZER FEMORAL UNIV (MISCELLANEOUS) ×2 IMPLANT
RETRIEVER SUT HEWSON (MISCELLANEOUS) ×2 IMPLANT
SEALER BIPOLAR AQUA 6.0 (INSTRUMENTS) ×2 IMPLANT
SET HNDPC FAN SPRY TIP SCT (DISPOSABLE) ×2 IMPLANT
SPACER CENTRAL ACCOLADE 4/5X14 (Spacer) ×2 IMPLANT
STAPLER VISISTAT 35W (STAPLE) ×2 IMPLANT
STEM HIP ACCOLADE SZ5 37X145 (Stem) ×2 IMPLANT
SUT ETHIBOND 2 V 37 (SUTURE) ×2 IMPLANT
SUT ETHIBOND CT1 BRD 2-0 30IN (SUTURE) ×4 IMPLANT
SUT ETHILON 2 0 PSLX (SUTURE) ×2 IMPLANT
SUT FIBERWIRE #2 38 T-5 BLUE (SUTURE) ×4
SUT MNCRL AB 3-0 PS2 18 (SUTURE) ×2 IMPLANT
SUT VIC AB 1 CT1 18XCR BRD 8 (SUTURE) ×1 IMPLANT
SUT VIC AB 1 CT1 27 (SUTURE) ×4
SUT VIC AB 1 CT1 27XBRD ANBCTR (SUTURE) ×2 IMPLANT
SUT VIC AB 1 CT1 8-18 (SUTURE) ×2
SUT VIC AB 2-0 CT1 27 (SUTURE) ×4
SUT VIC AB 2-0 CT1 TAPERPNT 27 (SUTURE) ×2 IMPLANT
SUT VLOC 180 0 9IN  GS21 (SUTURE) ×1
SUT VLOC 180 0 9IN GS21 (SUTURE) ×1 IMPLANT
SUTURE FIBERWR #2 38 T-5 BLUE (SUTURE) ×2 IMPLANT
TOWEL GREEN STERILE (TOWEL DISPOSABLE) ×2 IMPLANT
TOWEL GREEN STERILE FF (TOWEL DISPOSABLE) ×2 IMPLANT
TOWER CARTRIDGE SMART MIX (DISPOSABLE) ×2 IMPLANT
WATER STERILE IRR 1000ML POUR (IV SOLUTION) ×2 IMPLANT
YANKAUER SUCT BULB TIP NO VENT (SUCTIONS) ×2 IMPLANT

## 2021-06-04 NOTE — Consult Note (Signed)
Cardiology Consultation:   Patient ID: Jonathan Dollar Sr. MRN: [id number]; DOB: Jun 05, 1951  Admit date: 06/03/2021 Date of Consult: 06/04/2021  Primary Care Provider: Ginger Organ., MD Primary Cardiologist: None  Primary Electrophysiologist:  None    Patient Profile:   [name] is a 70 y.o. 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 fib, vascular dementia who is being seen today for the evaluation of pre operative risk stratification at the request of orthopedics and medicine.  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 fib, vascular dementia who presents after a mechanical fall leading to a high fracture. Medically he is stable with just pain from fracture and likely surgery in the AM.   Wife is at bedside when I see him. He has been working with PT to get adjusted to his new prosthesis. Overall he has been asymptomatic from a cardiac perspective. No chest pain with exertion or DOE. No recent medication changes. No stroke like symptoms.   Past Medical History:  Diagnosis Date   CAD (coronary artery disease)    CHF (congestive heart failure) ([name])    Diabetes mellitus without complication ([place])     type 2   DM (diabetes mellitus) ([place])    ESRD (end stage renal disease) ([place])    FUO (fever of unknown origin) 09/17/2019   Hyperlipidemia    Hypertension    Osteomyelitis ([place])    Peripheral arterial disease ([name])    PVD (peripheral vascular disease) ([place])    Renal disorder 2015   right kidney transplant   Stroke [facility])    Vascular dementia [facility])     Past Surgical History:  Procedure Laterality Date   ABDOMINAL AORTOGRAM N/A 08/05/2020   Procedure: ABDOMINAL AORTOGRAM;  Surgeon: [name], MD;  Location: [place] [facility];  Service: Cardiovascular;  Laterality: N/A;   ABDOMINAL AORTOGRAM W/LOWER EXTREMITY N/A 09/09/2020   Procedure: ABDOMINAL AORTOGRAM W/LOWER EXTREMITY;  Surgeon: [name], MD;  Location: [place] [facility];  Service: Cardiovascular;  Laterality: N/A;   AMPUTATION Right 08/06/2020   Procedure: Right Fifth Toe Ray Amputation;  Surgeon: [name], MD;  Location: [place];  Service: Vascular;  Laterality: Right;   AMPUTATION Right 11/02/2020   Procedure: RIGHT AMPUTATION BELOW KNEE;  Surgeon: [name], MD;  Location: [place];  Service: Vascular;  Laterality: Right;   BACK SURGERY     Has had 2 back surgeries   BUBBLE STUDY  09/18/2019   Procedure: BUBBLE STUDY;  Surgeon: Buford Dresser, MD;  Location: [place];  Service: Cardiovascular;;   Depression     ENDARTERECTOMY FEMORAL Right 08/06/2020   Procedure: Right Ilio- Femoral Artery Endarterectomy;  Surgeon: Jamelle Haring  N, MD;  Location: [place];  Service: Vascular;  Laterality: Right;   HAND SURGERY Right    HAND SURGERY     IR REMOVAL TUN CV CATH W/O [facility]  09/03/2019   KIDNEY TRANSPLANT     November 2015   LEG AMPUTATION BELOW KNEE Right    LOWER EXTREMITY ANGIOGRAPHY Bilateral 08/05/2020   Procedure: LOWER EXTREMITY ANGIOGRAPHY;  Surgeon: [name], MD;  Location: [place] [facility];  Service: Cardiovascular;  Laterality: Bilateral;   Memory loss     NEPHRECTOMY  TRANSPLANTED ORGAN     PATCH ANGIOPLASTY Right 08/06/2020   Procedure: PATCH ANGIOPLASTY;  Surgeon: [name], MD;  Location: [facility];  Service: Vascular;  Laterality: Right;   PERIPHERAL VASCULAR BALLOON ANGIOPLASTY Right 09/09/2020   Procedure: PERIPHERAL VASCULAR BALLOON ANGIOPLASTY;  Surgeon: [name], MD;  Location: [place] [facility];  Service: Cardiovascular;  Laterality: Right;  popliteal   PERIPHERAL VASCULAR INTERVENTION Right 08/05/2020   Procedure: PERIPHERAL VASCULAR INTERVENTION;  Surgeon: [name], MD;  Location: [place] [facility];  Service: Cardiovascular;  Laterality: Right;  SFA   PERIPHERAL VASCULAR INTERVENTION Right 09/09/2020   Procedure: PERIPHERAL VASCULAR INTERVENTION;  Surgeon: [name], MD;  Location: [place] [facility];  Service: Cardiovascular;  Laterality: Right;  external iliac   TEE WITHOUT CARDIOVERSION N/A 09/18/2019   Procedure: TRANSESOPHAGEAL ECHOCARDIOGRAM (TEE);  Surgeon: Buford Dresser, MD;  Location: [place];  Service: Cardiovascular;  Laterality: N/A;   WOUND DEBRIDEMENT Right 10/08/2020   Procedure: DEBRIDEMENT WOUND RIGHT FOOT;  Surgeon: [name], MD;  Location: [facility] OR;  Service: Vascular;  Laterality: Right;     Home Medications:  Prior to Admission medications   Medication Sig Start Date End Date Taking? Authorizing Provider  allopurinol (ZYLOPRIM) 100 MG tablet Take 100 mg by mouth at bedtime. 06/09/14  Yes [provider]  apixaban (ELIQUIS) 5 MG TABS tablet Take 5 mg by mouth 2 (two) times daily.   Yes [provider]  aspirin 81 MG chewable tablet Chew 81 mg by mouth daily.  06/09/14  Yes [provider]  atorvastatin (LIPITOR) 40 MG tablet Take 40 mg by mouth at bedtime.   Yes [provider]  b complex-vitamin c-folic acid (NEPHRO-VITE) 0.8 MG TABS tablet Take 1 tablet by mouth daily. 05/08/20  Yes [provider]  feeding supplement (BOOST HIGH  PROTEIN) LIQD Take 1 Container by mouth daily as needed (nutrition).   Yes [provider]  folic acid (FOLVITE) 1 MG tablet Take 1 tablet (1 mg total) by mouth daily. 05/11/21  Yes Garvin Fila, MD  insulin aspart (NOVOLOG) 100 UNIT/ML injection Insulin sliding scale: Blood sugar  120-150   3units                       151-200   4units                       201-250   7units                       251- 300  11units                       301-350   15uints                       351-400   20units                       >  400         call MD immediately 11/06/20  Yes Florencia Reasons, MD  Insulin Pen Needle 32G X 4 MM MISC 1 each by Other route as needed (insulin).  09/10/16  Yes [provider]  levalbuterol (XOPENEX HFA) 45 MCG/ACT inhaler Inhale 2 puffs into the lungs every 8 (eight) hours as needed for wheezing. 09/21/19  Yes Sheikh, Omair Latif, DO  lidocaine-prilocaine (EMLA) cream Apply 1 application topically 3 (three) times a week. 30 minutes prior to Dialysis - MWF 05/08/18  Yes [provider]  oxyCODONE-acetaminophen (PERCOCET/ROXICET) 5-325 MG tablet Take 1 tablet by mouth every 6 (six) hours as needed for moderate pain. 11/06/20  Yes Florencia Reasons, MD  predniSONE (DELTASONE) 5 MG tablet Take 5 mg by mouth daily with breakfast. 03/02/20  Yes [provider]  senna-docusate (SENOKOT-S) 8.6-50 MG tablet Take 1 tablet by mouth 2 (two) times daily. Hold if diarrhea Patient taking differently: Take 1 tablet by mouth 2 (two) times daily as needed for mild constipation. 11/06/20  Yes Florencia Reasons, MD  sevelamer carbonate (RENVELA) 800 MG tablet Take 800 mg by mouth 3 (three) times daily with meals. 03/31/20  Yes [provider]  donepezil (ARICEPT) 10 MG tablet Take 1 tablet (10 mg total) by mouth at bedtime. Start half a tablet daily for 4 weeks to be increased to 1 tablet daily if tolerated 05/11/21   Garvin Fila, MD    Inpatient Medications: Scheduled Meds:  Continuous  Infusions:  heparin     PRN Meds:   Allergies:   No Known Allergies  Social History:   Social History   Socioeconomic History   Marital status: Married    Spouse name: [name]   Number of children: Not on file   Years of education: Not on file   Highest education level: Not on file  Occupational History   Not on file  Tobacco Use   Smoking status: Former   Smokeless tobacco: Never  Scientific laboratory technician Use: Never used  Substance and Sexual Activity   Alcohol use: Never   Drug use: Never   Sexual activity: Not on file  Other Topics Concern   Not on file  Social History Narrative   Lives with wife, son, daughter in Sports coach and grandson   Right Handed   Drinks 1 cup caffeine daily   Social Determinants of Health   Financial Resource Strain: Not on file  Food Insecurity: Not on file  Transportation Needs: Not on file  Physical Activity: Not on file  Stress: Not on file  Social Connections: Not on file  Intimate Partner Violence: Not on file    Family History:    Family History  Problem Relation Age of Onset   Diabetes Mellitus II Mother    Cancer Mother        STOMACH   Diabetes Mellitus II Brother    Other Father        UNKOWN   Healthy Daughter    Diabetes Son      Review of Systems: [y] = yes, [ ]  = no    General: Weight gain [ ] ; Weight loss [ ] ; Anorexia [ ] ; Fatigue [ ] ; Fever [ ] ; Chills [ ] ; Weakness [ ]   Cardiac: Chest pain/pressure [ ] ; Resting SOB [ ] ; Exertional SOB [ ] ; Orthopnea [ ] ; Pedal Edema [ ] ; Palpitations [ ] ; Syncope [ ] ; Presyncope [ ] ; Paroxysmal nocturnal dyspnea[ ]   Pulmonary: Cough [ ] ;  Wheezing[ ] ; Hemoptysis[ ] ; Sputum [ ] ; Snoring [ ]   GI: Vomiting[ ] ; Dysphagia[ ] ; Melena[ ] ; Hematochezia [ ] ; Heartburn[ ] ; Abdominal pain [ ] ; Constipation [ ] ; Diarrhea [ ] ; BRBPR [ ]   GU: Hematuria[ ] ; Dysuria [ ] ; Nocturia[ ]   Vascular: Pain in legs with walking [ ] ; Pain in feet with lying flat [ ] ; Non-healing sores [ ] ; Stroke [ ] ; TIA [  ]; Slurred speech [ ] ;  Neuro: Headaches[ ] ; Vertigo[ ] ; Seizures[ ] ; Paresthesias[ ] ;Blurred vision [ ] ; Diplopia [ ] ; Vision changes [ ]   Ortho/Skin: Arthritis [ ] ; Joint pain [ y]; Muscle pain [ ] ; Joint swelling [ ] ; Back Pain [ ] ; Rash [ ]   Psych: Depression[ ] ; Anxiety[ ]   Heme: Bleeding problems [ ] ; Clotting disorders [ ] ; Anemia [ ]   Endocrine: Diabetes [ ] ; Thyroid dysfunction[ ]   Physical Exam/Data:   Vitals:   06/03/21 2100 06/03/21 2130 06/03/21 2200 06/03/21 2300  BP: (!) 148/65 (!) 146/69 (!) 144/69   Pulse: (!) 102 100 (!) 104   Resp: 17 17 (!) 21   Temp:      SpO2: 95% 98% 95%   Weight:    72.6 kg  Height:    5\' 9"  (1.753 m)   No intake or output data in the 24 hours ending 06/04/21 0052 Filed Weights   06/03/21 2300  Weight: 72.6 kg   Body mass index is 23.63 kg/m.  General:  nad HEENT: normal Lymph: no adenopathy Neck: no JVD Endocrine:  No thryomegaly Cardiac:  normal S1, S2; RRR; no murmur  Lungs:  clear to auscultation bilaterally, no wheezing, rhonchi or rales  Abd: soft, nontender, no hepatomegaly  Ext: no edema Musculoskeletal:  No deformities, BUE and BLE strength normal and equal. Bkas  Skin: warm and dry  Neuro:  CNs 2-12 intact, no focal abnormalities noted Psych:  Normal affect   EKG:  The EKG was personally reviewed and demonstrates:  nsr Telemetry:  Telemetry was personally reviewed and demonstrates:  nsr  Relevant CV Studies: TEE 09/2019: IMPRESSIONS     1. Left ventricular ejection fraction, by estimation, is 30 to 35%. The  left ventricle has severely decreased function. The left ventrical  demonstrates regional wall motion abnormalities (see scoring  diagram/findings for description). There is  moderately increased left ventricular hypertrophy. Left ventricular  diastolic function could not be evaluated. There is severe akinesis of the  left ventricular, entire apical segment.   2. Right ventricular systolic function is normal.  The right ventricular  size is normal.   3. No left atrial/left atrial appendage thrombus was detected.   4. The mitral valve is normal in structure and function. Mild mitral  valve regurgitation. No evidence of mitral stenosis.   5. The aortic valve is tricuspid. Aortic valve regurgitation is trivial .  No aortic stenosis is present.   6. There is mild (Grade II) plaque involving the descending aorta.   7. Agitated saline contrast bubble study was negative, with no evidence  of any interatrial shunt.   Laboratory Data:  Chemistry Recent Labs  Lab 06/03/21 1952  NA 136  K 4.5  CL 94*  GLUCOSE 119*  BUN 51*  CREATININE 8.40*    No results for input(s): PROT, ALBUMIN, AST, ALT, ALKPHOS, BILITOT in the last 168 hours. Hematology Recent Labs  Lab 06/03/21 1527 06/03/21 1952  WBC 7.1  --   RBC 3.93*  --   HGB 12.5* 13.6  HCT 37.7* 40.0  MCV 95.9  --   MCH 31.8  --   MCHC 33.2  --   RDW 17.4*  --   PLT 113*  --    Cardiac EnzymesNo results for input(s): TROPONINI in the last 168 hours. No results for input(s): TROPIPOC in the last 168 hours.  BNPNo results for input(s): BNP, PROBNP in the last 168 hours.  DDimer No results for input(s): DDIMER in the last 168 hours.  Radiology/Studies:  DG Chest 1 View  Result Date: 06/03/2021 CLINICAL DATA:  Fall with hip fracture EXAM: CHEST  1 VIEW COMPARISON:  09/15/2019, 07/30/2019 FINDINGS: Chronic elevation of left diaphragm. Diffuse bilateral reticular interstitial opacity consistent with chronic lung disease. Mild cardiomegaly with aortic atherosclerosis. No pleural effusion, pneumothorax, or acute airspace disease. IMPRESSION: Chronic interstitial lung disease.  No acute airspace disease. Electronically Signed   By: Donavan Foil M.D.   On: 06/03/2021 16:00   DG Hip Unilat With Pelvis 2-3 Views Left  Result Date: 06/03/2021 CLINICAL DATA:  Fall EXAM: DG HIP (WITH OR WITHOUT PELVIS) 2-3V LEFT COMPARISON:  None. FINDINGS:  Extensive vascular calcifications. Pubic symphysis and rami appear intact. Acute left femoral neck fracture with cranial displacement of trochanter. Femoral head projects in joint. IMPRESSION: Acute mildly displaced left femoral neck fracture Electronically Signed   By: Donavan Foil M.D.   On: 06/03/2021 15:58    Assessment and Plan:   Pre-operative risk stratification: his chronic co-morbidities convey a high preoperative risk with revised cardiac risk index (RCRI) score of 5, a 15% risk of major adverse cardiovascular event in the 30-day post operative period. However, given he is NYHA class 1 HF and no anginal symptoms - there is no indication for further testing prior to surgery as he is best optimized for surgery in his current state.    Recommendations:  - agree with obtaining echo, however this will not change management and should not preclude surgery - continue aspirin in surgery if deemed okay by surgical team - transition apixaban to heparin; given his history of intracardiac thrombus would hold heparin just prior to surgery and restart as soon as possible after surgery to best reduce the risk of stroke  - as best as possible, limit amount of pre and periprocedural fluid administration given HF/cardiomyopathy - no role for stress testing prior to procedure    I discussed these thoughts and recommendations with patient and his wife at bedside. I discussed that we are not able to clear patient of any risk from the surgery and he is high risk, however at this time he is best medically optimized. They stated understanding.   For questions or updates, please contact [place] Please consult [email or web address] for contact info under     Signed, Doyne Keel, MD  06/04/2021 12:52 AM

## 2021-06-04 NOTE — Progress Notes (Signed)
Pt arrived on unit, assessed, informed of plan of care

## 2021-06-04 NOTE — Consult Note (Addendum)
[name] Renal Consultation Note    Indication for Consultation:  Management of ESRD/hemodialysis, anemia, hypertension/volume, and secondary hyperparathyroidism.  HPI: Jonathan [name]. is a 70 y.o. male with PMH including ESRD on dialysis, CHF, T2DM, HTN, and vascular dementia, who presented to the ED on 06/03/21 after a fall. Patient's wife reports he wears a prosthetic leg and fell while trying to get up from the dining table. X-ray showed acute mildly displaced left femoral neck fracture. Ortho was consulted and is planning for surgery today.   Nephrology was consulted for management of ESRD. Labs notable for K+ 4.7, BUN 62, Cr 8.83, WBC 6.9, Hgb 10.2, Plt 164. He attends dialysis on MWF. Last HD was Wednesday. He denies any recent issues with dialysis. Denies SOB, orthopnea, cough, CP, palpitations, dizziness, abdominal pain, N/V/D, and edema.   Past Medical History:  Diagnosis Date   CAD (coronary artery disease)    CHF (congestive heart failure) ([name])    Diabetes mellitus without complication ([place])    type 2   DM (diabetes mellitus) ([place])    ESRD (end stage renal disease) ([place])    FUO (fever of unknown origin) 09/17/2019   Hyperlipidemia    Hypertension    Osteomyelitis ([place])    Peripheral arterial disease ([name])    PVD (peripheral vascular disease) ([place])    Renal disorder 2015   right kidney transplant   Stroke [facility])    Vascular dementia [facility])    Past Surgical History:  Procedure Laterality Date   ABDOMINAL AORTOGRAM N/A 08/05/2020   Procedure: ABDOMINAL AORTOGRAM;  Surgeon: [name], MD;  Location: [place] CV LAB;  Service: Cardiovascular;  Laterality: N/A;   ABDOMINAL AORTOGRAM W/LOWER EXTREMITY N/A 09/09/2020   Procedure: ABDOMINAL AORTOGRAM W/LOWER EXTREMITY;  Surgeon: [name], MD;  Location: [place] CV LAB;  Service: Cardiovascular;  Laterality: N/A;   AMPUTATION Right 08/06/2020   Procedure: Right Fifth Toe Ray Amputation;   Surgeon: [name], MD;  Location: [place];  Service: Vascular;  Laterality: Right;   AMPUTATION Right 11/02/2020   Procedure: RIGHT AMPUTATION BELOW KNEE;  Surgeon: [name], MD;  Location: [place];  Service: Vascular;  Laterality: Right;   BACK SURGERY     Has had 2 back surgeries   BUBBLE STUDY  09/18/2019   Procedure: BUBBLE STUDY;  Surgeon: Buford Dresser, MD;  Location: [place];  Service: Cardiovascular;;   Depression     ENDARTERECTOMY FEMORAL Right 08/06/2020   Procedure: Right Ilio- Femoral Artery Endarterectomy;  Surgeon: [name], MD;  Location[facility] OR;  Service: Vascular;  Laterality: Right;   HAND SURGERY Right    HAND SURGERY     IR REMOVAL TUN CV CATH W/O [facility]  09/03/2019   KIDNEY TRANSPLANT     November 2015   LEG AMPUTATION BELOW KNEE Right    LOWER EXTREMITY ANGIOGRAPHY Bilateral 08/05/2020   Procedure: LOWER EXTREMITY ANGIOGRAPHY;  Surgeon: [name], MD;  Location: [place] CV LAB;  Service: Cardiovascular;  Laterality: Bilateral;   Memory loss     NEPHRECTOMY TRANSPLANTED ORGAN     PATCH ANGIOPLASTY Right 08/06/2020   Procedure: PATCH ANGIOPLASTY;  Surgeon: [name], MD;  Location[facility] OR;  Service: Vascular;  Laterality: Right;   PERIPHERAL VASCULAR BALLOON ANGIOPLASTY Right 09/09/2020   Procedure: PERIPHERAL VASCULAR BALLOON ANGIOPLASTY;  Surgeon: [name], MD;  Location: [place] CV LAB;  Service: Cardiovascular;  Laterality: Right;  popliteal  PERIPHERAL VASCULAR INTERVENTION Right 08/05/2020   Procedure: PERIPHERAL VASCULAR INTERVENTION;  Surgeon: [name], MD;  Location: [place] CV LAB;  Service: Cardiovascular;  Laterality: Right;  SFA   PERIPHERAL VASCULAR INTERVENTION Right 09/09/2020   Procedure: PERIPHERAL VASCULAR INTERVENTION;  Surgeon: [name], MD;  Location: [place] CV LAB;  Service: Cardiovascular;  Laterality: Right;  external iliac   TEE WITHOUT CARDIOVERSION N/A  09/18/2019   Procedure: TRANSESOPHAGEAL ECHOCARDIOGRAM (TEE);  Surgeon: Buford Dresser, MD;  Location: [facility] ENDOSCOPY;  Service: Cardiovascular;  Laterality: N/A;   WOUND DEBRIDEMENT Right 10/08/2020   Procedure: DEBRIDEMENT WOUND RIGHT FOOT;  Surgeon: [name], MD;  Location: [facility] OR;  Service: Vascular;  Laterality: Right;   Family History  Problem Relation Age of Onset   Diabetes Mellitus II Mother    Cancer Mother        STOMACH   Diabetes Mellitus II Brother    Other Father        UNKOWN   Healthy Daughter    Diabetes Son    Social History:  reports that he has quit smoking. He has never used smokeless tobacco. He reports that he does not drink alcohol and does not use drugs.  ROS: As per HPI otherwise negative.  Physical Exam: Vitals:   06/04/21 0155 06/04/21 0339 06/04/21 0434 06/04/21 0805  BP:  (!) 145/62 (!) 142/62 (!) 157/73  Pulse:  94 93 92  Resp:  (!) 21 20 18   Temp: 99.8 F (37.7 C) 99 F (37.2 C)  98.4 F (36.9 C)  TempSrc: Oral Oral  Oral  SpO2:  94% 93% 98%  Weight:      Height:         General: Well developed, well nourished, in no acute distress. Head: Normocephalic, atraumatic, sclera non-icteric, mucus membranes are moist. Lungs: Clear bilaterally to auscultation without wheezes, rales, or rhonchi. Breathing is unlabored. Heart: RRR with normal S1, S2. No murmurs, rubs, or gallops appreciated. Abdomen: Soft, non-tender, non-distended with normoactive bowel sounds. No rebound/guarding. No obvious abdominal masses. Musculoskeletal:  R leg BKA Lower extremities: No edema bilateral lower extremities Neuro: Alert and oriented X 3. Moves all extremities spontaneously. Psych:  Responds to questions appropriately with a normal affect. Dialysis Access: RUE AVF + bruit  No Known Allergies Prior to Admission medications   Medication Sig Start Date End Date Taking? Authorizing Provider  allopurinol (ZYLOPRIM) 100 MG tablet Take 100 mg by mouth  at bedtime. 06/09/14  Yes [provider]  apixaban (ELIQUIS) 5 MG TABS tablet Take 5 mg by mouth 2 (two) times daily.   Yes [provider]  aspirin 81 MG chewable tablet Chew 81 mg by mouth daily.  06/09/14  Yes [provider]  atorvastatin (LIPITOR) 40 MG tablet Take 40 mg by mouth at bedtime.   Yes [provider]  b complex-vitamin c-folic acid (NEPHRO-VITE) 0.8 MG TABS tablet Take 1 tablet by mouth daily. 05/08/20  Yes [provider]  feeding supplement (BOOST HIGH PROTEIN) LIQD Take 1 Container by mouth daily as needed (nutrition).   Yes [provider]  folic acid (FOLVITE) 1 MG tablet Take 1 tablet (1 mg total) by mouth daily. 05/11/21  Yes Garvin Fila, MD  insulin aspart (NOVOLOG) 100 UNIT/ML injection Insulin sliding scale: Blood sugar  120-150   3units                       151-200  4units                       201-250   7units                       251- 300  11units                       301-350   15uints                       351-400   20units                       >400         call MD immediately 11/06/20  Yes Florencia Reasons, MD  Insulin Pen Needle 32G X 4 MM MISC 1 each by Other route as needed (insulin).  09/10/16  Yes [provider]  levalbuterol (XOPENEX HFA) 45 MCG/ACT inhaler Inhale 2 puffs into the lungs every 8 (eight) hours as needed for wheezing. 09/21/19  Yes Sheikh, Omair Latif, DO  lidocaine-prilocaine (EMLA) cream Apply 1 application topically 3 (three) times a week. 30 minutes prior to Dialysis - MWF 05/08/18  Yes [provider]  oxyCODONE-acetaminophen (PERCOCET/ROXICET) 5-325 MG tablet Take 1 tablet by mouth every 6 (six) hours as needed for moderate pain. 11/06/20  Yes Florencia Reasons, MD  predniSONE (DELTASONE) 5 MG tablet Take 5 mg by mouth daily with breakfast. 03/02/20  Yes [provider]  senna-docusate (SENOKOT-S) 8.6-50 MG tablet Take 1 tablet by mouth 2 (two) times daily. Hold if  diarrhea Patient taking differently: Take 1 tablet by mouth 2 (two) times daily as needed for mild constipation. 11/06/20  Yes Florencia Reasons, MD  sevelamer carbonate (RENVELA) 800 MG tablet Take 800 mg by mouth 3 (three) times daily with meals. 03/31/20  Yes [provider]  donepezil (ARICEPT) 10 MG tablet Take 1 tablet (10 mg total) by mouth at bedtime. Start half a tablet daily for 4 weeks to be increased to 1 tablet daily if tolerated 05/11/21   Garvin Fila, MD   Current Facility-Administered Medications  Medication Dose Route Frequency Provider Last Rate Last Admin   allopurinol (ZYLOPRIM) tablet 100 mg  100 mg Oral QHS Shela Leff, MD       atorvastatin (LIPITOR) tablet 40 mg  40 mg Oral QHS Shela Leff, MD       ceFAZolin (ANCEF) 2-4 GM/100ML-% IVPB            chlorhexidine (PERIDEX) 0.12 % solution            heparin ADULT infusion 100 units/mL (25000 units/270mL)  1,000 Units/hr Intravenous Continuous Laren Everts, RPH   Stopped at 06/04/21 0403   HYDROcodone-acetaminophen (NORCO/VICODIN) 5-325 MG per tablet 1 tablet  1 tablet Oral Q6H PRN Shela Leff, MD       HYDROmorphone (DILAUDID) injection 0.5 mg  0.5 mg Intravenous Q4H PRN Shela Leff, MD       insulin aspart (novoLOG) injection 0-9 Units  0-9 Units Subcutaneous Q4H Shela Leff, MD       polyethylene glycol (MIRALAX / GLYCOLAX) packet 17 g  17 g Oral Daily PRN Shela Leff, MD       predniSONE (DELTASONE) tablet 5 mg  5 mg Oral Q breakfast Shela Leff, MD       sevelamer carbonate (RENVELA) tablet 800 mg  800  mg Oral TID WC Shela Leff, MD       Labs: Basic Metabolic Panel: Recent Labs  Lab 06/03/21 1952 06/04/21 0459  NA 136 137  K 4.5 4.7  CL 94* 95*  CO2  --  30  GLUCOSE 119* 101*  BUN 51* 62*  CREATININE 8.40* 8.83*  CALCIUM  --  8.8*   Liver Function Tests: No results for input(s): AST, ALT, ALKPHOS, BILITOT, PROT, ALBUMIN in the last 168 hours. No  results for input(s): LIPASE, AMYLASE in the last 168 hours. No results for input(s): AMMONIA in the last 168 hours. CBC: Recent Labs  Lab 06/03/21 1527 06/03/21 1952 06/04/21 0818  WBC 7.1  --  6.9  NEUTROABS 5.9  --   --   HGB 12.5* 13.6 10.2*  HCT 37.7* 40.0 31.6*  MCV 95.9  --  96.0  PLT 113*  --  164   Cardiac Enzymes: No results for input(s): CKTOTAL, CKMB, CKMBINDEX, TROPONINI in the last 168 hours. CBG: Recent Labs  Lab 06/04/21 0228 06/04/21 0440  GLUCAP 105* 100*    Studies/Results: DG Chest 1 View  Result Date: 06/03/2021 CLINICAL DATA:  Fall with hip fracture EXAM: CHEST  1 VIEW COMPARISON:  09/15/2019, 07/30/2019 FINDINGS: Chronic elevation of left diaphragm. Diffuse bilateral reticular interstitial opacity consistent with chronic lung disease. Mild cardiomegaly with aortic atherosclerosis. No pleural effusion, pneumothorax, or acute airspace disease. IMPRESSION: Chronic interstitial lung disease.  No acute airspace disease. Electronically Signed   By: Donavan Foil M.D.   On: 06/03/2021 16:00   DG Hip Unilat With Pelvis 2-3 Views Left  Result Date: 06/03/2021 CLINICAL DATA:  Fall EXAM: DG HIP (WITH OR WITHOUT PELVIS) 2-3V LEFT COMPARISON:  None. FINDINGS: Extensive vascular calcifications. Pubic symphysis and rami appear intact. Acute left femoral neck fracture with cranial displacement of trochanter. Femoral head projects in joint. IMPRESSION: Acute mildly displaced left femoral neck fracture Electronically Signed   By: Donavan Foil M.D.   On: 06/03/2021 15:58    Dialysis Orders:  Center: [facility]  on MWF . 180NRe, 4 hour 22min, BFR 400, DFR Auto 1.5, EDW 61.7kg, 2K, 2Ca, AVF 15g Heparin 3000 unit bolus  Calcitriol 1.77mcg PO q HD  Assessment/Plan:  Left femoral neck fracture: Orthopedic surgery planning surgery this AM, management per ortho  ESRD:  Dialysis on MWF schedule, no emergent indications for RRT at present. Will plan for HD later today after his  surgery.   Hypertension/volume: No volume overload on exam, BP moderately elevated. Will pan   Anemia: Hgb 10.2, not on ESA outpatient, follow post-op  Metabolic bone disease: Calcium controlled. Continue calcitriol and binders.   Nutrition:  Currently NPO for surgery CAD/a.fib/hx of LV thrombus: cardiology consulting for surgical clearance  Anice Paganini, PA-C 06/04/2021, 8:56 AM  [facility] Pager: [phone]  I have seen and examined this patient and agree with plan and assessment in the above note with renal recommendations/intervention highlighted.  Plan for HD after surgery today. [place] John A Katharyn Schauer,MD 06/04/2021 11:47 AM

## 2021-06-04 NOTE — Progress Notes (Signed)
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  No Known Allergies  Patient Measurements: Height: 5\' 9"  (175.3 cm) Weight: 72.6 kg (160 lb) IBW/kg (Calculated) : 70.7  Vital Signs: Temp: 97.9 F (36.6 C) (10/28 1503) Temp Source: Oral (10/28 1503) BP: 127/67 (10/28 1503) Pulse Rate: 85 (10/28 1503)  Labs: Recent Labs    06/03/21 1527 06/03/21 1952 06/04/21 0459 06/04/21 0818  HGB 12.5* 13.6  --  10.2*  HCT 37.7* 40.0  --  31.6*  PLT 113*  --   --  164  APTT  --   --   --  40*  LABPROT  --   --  16.4*  --   INR  --   --  1.3*  --   HEPARINUNFRC  --   --   --  >1.10*  CREATININE  --  8.40* 8.83* 9.07*  TROPONINIHS  --   --  80* 75*     Estimated Creatinine Clearance: 7.8 mL/min (A) (by C-G formula based on SCr of 9.07 mg/dL (H)).   Medical History: Past Medical History:  Diagnosis Date   CAD (coronary artery disease)    CHF (congestive heart failure) ([name])    Diabetes mellitus without complication ([place])    type 2   DM (diabetes mellitus) ([place])    ESRD (end stage renal disease) ([place])    FUO (fever of unknown origin) 09/17/2019   Hyperlipidemia    Hypertension    Osteomyelitis ([place])    Peripheral arterial disease ([place])    PVD (peripheral vascular disease) ([place])    Renal disorder 2015   right kidney transplant   Stroke [facility])    Vascular dementia [facility])     Assessment: 70yo male fell while attempting to mobilize with new RLE prosthesis >> sustained displaced femoral neck fracture >> awaiting surgical intervention, to begin heparin bridge from Eliquis for h/o stroke d/t LV thrombosis; last dose of Eliquis taken 10/27 1130a. Pharmacy consulted for heparin.  Heparin stopped at 7am 10/28 for OR. Labs drawn at 8:20am with HL >1.1 due to apixaban and aPTT 40 subtherapeutic but heparin was off. Now s/p OR and going to dialysis.   Goal of Therapy:  Heparin level 0.3-0.7 units/ml aPTT 66-102  seconds Monitor platelets by anticoagulation protocol: Yes   Plan:  Heparin infusion at 1000 units/hr per MD  F/u aPTT until correlates with heparin level  Monitor daily aPTT, HL, CBC/plt Monitor for signs/symptoms of bleeding  F/u restart apixaban   Benetta Spar, PharmD, BCPS, [facility] Clinical Pharmacist  Please check AMION for all [place] phone numbers After 10:00 PM, call [place]

## 2021-06-04 NOTE — Transfer of Care (Signed)
Immediate Anesthesia Transfer of Care Note  Patient: Jonathan Dollar Sr.  Procedure(s) Performed: ARTHROPLASTY BIPOLAR HIP (HEMIARTHROPLASTY) (Left: Hip)  Patient Location: PACU  Anesthesia Type:General  Level of Consciousness: drowsy, patient cooperative, lethargic and responds to stimulation  Airway & Oxygen Therapy: Patient Spontanous Breathing and Patient connected to face mask oxygen  Post-op Assessment: Report given to RN, Post -op Vital signs reviewed and stable and Patient moving all extremities  Post vital signs: Reviewed and stable  Last Vitals:  Vitals Value Taken Time  BP 154/67 06/04/21 1355  Temp    Pulse 82   Resp 14 06/04/21 1356  SpO2 100   Vitals shown include unvalidated device data.  Last Pain:  Vitals:   06/04/21 0936  TempSrc: Oral  PainSc:          Complications: No notable events documented.

## 2021-06-04 NOTE — Progress Notes (Signed)
Pt arrived to unit with heparin drip stopped. Was told by pharm heparin needed to be restarted. Heparin drip restarted at 0545. Heparin drip discontinued at 0700am with clarification from surgeon ([place],MD) for heparin to be stopped. Provider aware, heparin discontinued

## 2021-06-04 NOTE — Anesthesia Procedure Notes (Signed)
Procedure Name: Intubation Date/Time: 06/04/2021 11:07 AM Performed by: Rande Brunt, CRNA Pre-anesthesia Checklist: Patient identified, Emergency Drugs available, Suction available and Patient being monitored Patient Re-evaluated:Patient Re-evaluated prior to induction Oxygen Delivery Method: Circle System Utilized Preoxygenation: Pre-oxygenation with 100% oxygen Induction Type: IV induction Ventilation: Mask ventilation without difficulty Laryngoscope Size: Mac and 3 Grade View: Grade I Tube type: Oral Tube size: 7.5 mm Number of attempts: 1 Airway Equipment and Method: Stylet and Oral airway Placement Confirmation: ETT inserted through vocal cords under direct vision, positive ETCO2 and breath sounds checked- equal and bilateral Secured at: 24 cm Tube secured with: Tape Dental Injury: Teeth and Oropharynx as per pre-operative assessment

## 2021-06-04 NOTE — Anesthesia Postprocedure Evaluation (Signed)
Anesthesia Post Note  Patient: Jonathan Dollar Sr.  Procedure(s) Performed: ARTHROPLASTY BIPOLAR HIP (HEMIARTHROPLASTY) (Left: Hip)     Patient location during evaluation: PACU Anesthesia Type: General Level of consciousness: sedated Pain management: pain level controlled Vital Signs Assessment: post-procedure vital signs reviewed and stable Respiratory status: spontaneous breathing and respiratory function stable Cardiovascular status: stable Postop Assessment: no apparent nausea or vomiting Anesthetic complications: no   No notable events documented.  Last Vitals:  Vitals:   06/04/21 1440 06/04/21 1503  BP: 130/65 127/67  Pulse: 87 85  Resp: 12 18  Temp:  36.6 C  SpO2: 93% 95%    Last Pain:  Vitals:   06/04/21 1503  TempSrc: Oral  PainSc:                  [place]

## 2021-06-04 NOTE — Progress Notes (Signed)
Pt receives out-pt HD at [facility] on MWF. He arrives at 11:30 for 11:50 chair time. Will assist as needed.  Melven Sartorius Renal Navigator [phone]

## 2021-06-04 NOTE — Progress Notes (Addendum)
     Subjective:  Patient reports pain as well controlled. ECHO performed this morning. Heparin drip stopped at 7am in anticipation of surgery. No new issues.  Objective:   VITALS:   Vitals:   06/04/21 0434 06/04/21 0805 06/04/21 0934 06/04/21 0936  BP: (!) 142/62 (!) 157/73 (!) 174/68   Pulse: 93 92 97   Resp: 20 18 18    Temp:  98.4 F (36.9 C)  98.4 F (36.9 C)  TempSrc:  Oral  Oral  SpO2: 93% 98% 97%   Weight:      Height:       LLE: Sensation intact distally Intact pulses distally Dorsiflexion/Plantar flexion intact No cellulitis present Compartment soft    Lab Results  Component Value Date   WBC 6.9 06/04/2021   HGB 10.2 (L) 06/04/2021   HCT 31.6 (L) 06/04/2021   MCV 96.0 06/04/2021   PLT 164 06/04/2021   BMET    Component Value Date/Time   NA 135 06/04/2021 0818   K 4.7 06/04/2021 0818   CL 94 (L) 06/04/2021 0818   CO2 25 06/04/2021 0818   GLUCOSE 100 (H) 06/04/2021 0818   BUN 64 (H) 06/04/2021 0818   CREATININE 9.07 (H) 06/04/2021 0818   CALCIUM 8.7 (L) 06/04/2021 0818   GFRNONAA 6 (L) 06/04/2021 0818     Assessment/Plan: Day of Surgery   Principal Problem:   Hip fracture ([name]) Active Problems:   History of coronary artery disease   ESRD (end stage renal disease) on dialysis ([name])   Hyperlipidemia   CHF (congestive heart failure) ([name])  Left displaced femoral neck fracture indicated for hip hemiarthroplasty  The risks benefits and alternatives were discussed with the patient and his spouse including but not limited to the risks of nonoperative treatment, versus surgical intervention including infection, bleeding, nerve injury, periprosthetic fracture, the need for revision surgery, dislocation, leg length discrepancy, blood clots, cardiopulmonary complications, morbidity, mortality, among others, and they were willing to proceed.   Consent was signed by myself and the patient's wife.  left leg was marked.     Sanda Dejoy A  Makaley Storts 06/04/2021, 10:47 AM   Charlies Constable, MD Cell [phone]  Contact information:   [id number] 7am-5pm epic message Dr. Zachery Dakins, or call office for patient follow up: [phone] After hours and holidays please check [email or web address] for group call information for [facility]

## 2021-06-04 NOTE — Progress Notes (Signed)
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 Moderate dementia Admit to [facility] 10/28 left hip pain with mechanical fall while at dinner table and trying to negotiate and get up-left hip x-ray = displaced left femoral neck fracture  Given extensive cardiac history cardiology consulted Orthopedics consulted additionally  Hospital-Problem based course  Displaced left femoral fracture Surgically optimized as moderate to high risk based on risk scores but needs surgery regardless for mobility Defer to orthopedics  for surgery Likely will need skilled placement HFrEF EF 30 to 35%  MI at age 33 Prior apical thrombus and ventricle previously on Eliquis  Chronic anticoagulation as per cardiology Heparin was held at 7 AM prior to surgery--resume aspirin and Eliquis postoperatively as per orthopedics Not on beta-blocker or ACE inhibitor? 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  Paroxysmal A. Fib Not on rate control-keep on monitors perioperatively   DVT prophylaxis: Perioperative heparin Code Status: Full code Family Communication: Discussed with wife at the bedside Disposition:  Status is: Inpatient  Remains inpatient appropriate because: Surgery Anticipate will need skilled placement.       Consultants:  Orthopedics Cardiology  Procedures:   Antimicrobials:     Subjective:  Monosyllabic not very responsive Wife does most of the talking form About to go for surgery  Objective: Vitals:   06/04/21 0145 06/04/21 0155 06/04/21 0339 06/04/21 0434  BP:   (!) 145/62 (!) 142/62  Pulse: 94  94 93  Resp: 20  (!) 21 20  Temp:  99.8 F (37.7 C) 99 F (37.2 C)   TempSrc:  Oral Oral   SpO2: 93%  94% 93%  Weight:      Height:       No intake or output data in the 24 hours ending 06/04/21 0707 Filed Weights   06/03/21 2300  Weight: 72.6 kg    Examination:  EOMI NCAT no focal deficit CTA B no added sounds S1-S2 he has PVCs but otherwise sinus rhythm  abdomen soft nontender no rebound no guarding--do not appreciate any  Hepatosplenomegaly Right lower extremity is in a stump shrinker Left lower extremity is externally rotated He follows commands but does not verbalize much hence full neuro exam was deferred  Data Reviewed: personally reviewed   CBC    Component Value Date/Time   WBC 7.1 06/03/2021 1527   RBC 3.93 (L) 06/03/2021 1527   HGB 13.6 06/03/2021 1952   HCT 40.0 06/03/2021 1952   PLT 113 (L) 06/03/2021 1527   MCV 95.9 06/03/2021 1527  MCH 31.8 06/03/2021 1527   MCHC 33.2 06/03/2021 1527   RDW 17.4 (H) 06/03/2021 1527   LYMPHSABS 0.9 06/03/2021 1527   MONOABS 0.3 06/03/2021 1527   EOSABS 0.0 06/03/2021 1527   BASOSABS 0.0 06/03/2021 1527   CMP Latest Ref Rng & Units 06/04/2021 06/03/2021 11/06/2020  Glucose 70 - 99 mg/dL 101(H) 119(H) 155(H)  BUN 8 - 23 mg/dL 62(H)  51(H) 41(H)  Creatinine 0.61 - 1.24 mg/dL 8.83(H) 8.40(H) 6.88(H)  Sodium 135 - 145 mmol/L 137 136 132(L)  Potassium 3.5 - 5.1 mmol/L 4.7 4.5 4.4  Chloride 98 - 111 mmol/L 95(L) 94(L) 98  CO2 22 - 32 mmol/L 30 - 27  Calcium 8.9 - 10.3 mg/dL 8.8(L) - 8.8(L)  Total Protein 6.5 - 8.1 g/dL - - -  Total Bilirubin 0.3 - 1.2 mg/dL - - -  Alkaline Phos 38 - 126 U/L - - -  AST 15 - 41 U/L - - -  ALT 0 - 44 U/L - - -     Radiology Studies: DG Chest 1 View  Result Date: 06/03/2021 CLINICAL DATA:  Fall with hip fracture EXAM: CHEST  1 VIEW COMPARISON:  09/15/2019, 07/30/2019 FINDINGS: Chronic elevation of left diaphragm. Diffuse bilateral reticular interstitial opacity consistent with chronic lung disease. Mild cardiomegaly with aortic atherosclerosis. No pleural effusion, pneumothorax, or acute airspace disease. IMPRESSION: Chronic interstitial lung disease.  No acute airspace disease. Electronically Signed   By: Donavan Foil M.D.   On: 06/03/2021 16:00   DG Hip Unilat With Pelvis 2-3 Views Left  Result Date: 06/03/2021 CLINICAL DATA:  Fall EXAM: DG HIP (WITH OR WITHOUT PELVIS) 2-3V LEFT COMPARISON:  None. FINDINGS: Extensive vascular calcifications. Pubic symphysis and rami appear intact. Acute left femoral neck fracture with cranial displacement of trochanter. Femoral head projects in joint. IMPRESSION: Acute mildly displaced left femoral neck fracture Electronically Signed   By: Donavan Foil M.D.   On: 06/03/2021 15:58     Scheduled Meds:  allopurinol  100 mg Oral QHS   atorvastatin  40 mg Oral QHS   insulin aspart  0-9 Units Subcutaneous Q4H   predniSONE  5 mg Oral Q breakfast   sevelamer carbonate  800 mg Oral TID WC   Continuous Infusions:  heparin 1,000 Units/hr (06/04/21 0545)     LOS: 1 day   Time spent: [place], MD [facility] To contact the attending provider between 7A-7P or the covering provider during after hours 7P-7A, please log into the web  site [email or web address] and access using universal [place] password for that web site. If you do not have the password, please call the hospital operator.  06/04/2021, 7:07 AM

## 2021-06-04 NOTE — Op Note (Signed)
06/04/2021  2:07 PM  PATIENT:  [name]   MRN: [id number]  PRE-OPERATIVE DIAGNOSIS:  left displaced femoral neck fracture  POST-OPERATIVE DIAGNOSIS:  left displaced femoral neck fracture  PROCEDURE:  Procedure(s): ARTHROPLASTY BIPOLAR HIP (HEMIARTHROPLASTY)  PREOPERATIVE INDICATIONS:  Jonathan PLOEGER Sr. is an 70 y.o. male with multiple significant medical comorbidities including dementia, chronic kidney disease on dialysis, history of stroke and LV thrombus, history of right foot osteomyelitis status post below-knee amputation earlier this year who was admitted 06/03/2021 with a diagnosis of left femoral neck fracture and elected for surgical management.  The risks benefits and alternatives were discussed with the patient and his spouse including but not limited to the risks of nonoperative treatment, versus surgical intervention including infection, bleeding, nerve injury, periprosthetic fracture, the need for revision surgery, dislocation, leg length discrepancy, blood clots, cardiopulmonary complications, morbidity, mortality, among others, and they were willing to proceed.  Given his multiple comorbidities and current functional status we agreed that he would be a better candidate for hemiarthroplasty over total hip arthroplasty.  Predicted outcome is good, although there will be at least a six to nine month expected recovery.   OPERATIVE REPORT     SURGEON:  Charlies Constable, MD    ASSISTANT:  RN First Assistant: Deland Pretty, RN  (Present throughout the entire procedure,  necessary for completion of procedure in a timely manner, assisting with retraction, instrumentation, and closure)     ANESTHESIA:  general anesthesia  ESTIMATED BLOOD LOSS: 086VH    COMPLICATIONS:  None.   UNIQUE ASPECTS OF THE CASE: Moderate general bleeding from bone and soft tissues given anticoagulation with Eliquis and heparin drip preop.  Severe muscle atrophy.  Small nondisplaced fracture in  the calcar extending distal to the femoral neck cut but not into the lesser trochanter or intertrochanteric region     COMPONENTS:  Implant Name Type Inv. Item Serial No. Manufacturer Lot No. LRB No. Used Action  SPACER CENTRAL ACCOLADE 4/5X14 - [id number] Spacer SPACER CENTRAL ACCOLADE 4/5X14  [employer] [id number] Left 1 Implanted  CEMENT BONE SIMPLEX SPEEDSET - [id number] Cement CEMENT BONE SIMPLEX SPEEDSET  [employer] [id number] Left 1 Implanted  CEMENT BONE SIMPLEX SPEEDSET - [id number] Cement CEMENT BONE SIMPLEX SPEEDSET  [employer] [id number] Left 1 Implanted  HEAD BIPOLAR LOCK UHR 28X52 - [id number] Head HEAD BIPOLAR LOCK UHR 28X52  [employer] [id number] Left 1 Implanted  STEM HIP ACCOLADE SZ5 [id number] - [id number] Stem STEM HIP ACCOLADE SZ5 [id number]  [employer] [id number] Left 1 Implanted  HIP BIOLOX HD 28/+4 - [id number] Joint HIP BIOLOX HD 28/+4  [employer] [id number] Left 1 Implanted  HEAD FEM LFIT V40 28MM +8 - [id number] Head HEAD FEM LFIT V40 28MM +8  [employer] [id number] Left 1 Implanted      PROCEDURE IN DETAIL: The patient was met in the holding area and identified.  The appropriate hip  was marked at the operative site. The patient was then transported to the OR and  placed under anesthesia.  At that point, the patient was  placed in the lateral decubitus position with the operative side up and  secured to the operating room table and all bony prominences padded.     The operative lower extremity was prepped from the iliac crest to the ankle.  Sterile draping was performed.  Time out was performed prior to incision.      A routine posterolateral approach was utilized via sharp dissection  carried down to  the subcutaneous tissue.  Gross bleeders were Bovie  coagulated.  The iliotibial band was identified and incised  along the length of the skin incision.  A Charnley retractor was inserted.  With the hip internally rotated, the short external rotators   were identified.  There was significant hemorrhagic bursitis noted which was resected and the external rotators and capsule were adherent thus they were released as a single sleeve off the femoral attachment. The capsule and external rotators were  tagged with #1 Vicryl.  Care was taken to preserve the labrum.  The femoral neck was exposed, and I resected the femoral neck using the appropriate jig. This was performed at approximately a thumb's breadth above the lesser trochanter.  Following the neck cut extension of the femoral neck fracture was noted into the posterior cortex of the remaining neck but not into the lesser trochanter or intertrochanteric region.  So to not propagate a fracture and bypass the nondisplaced fracture line we elected to broach for a cemented stem.    I then exposed the deep acetabulum, cleared out any tissue including the ligamentum teres.    I then prepared the proximal femur using the box cutter, Charnley awl, and then sequentially broached up to a size 5 cemented stem.  No displacement or propagation of the fracture was noted during broaching.  A trial utilized, and I reduced the hip, leg lengths were assessed clinically at the level of the knee and felt to be equal. The hip was then taken through a full range of motion, the hip was stable at full extension and 90 degrees external rotation without anterior subluxation. The hip was also stable in the position of sleep, and in neutral abduction up to 90 degrees flexion, and 70 degrees IR using a +4 head. The trial components were then removed.   We then prepared canal for cementation.  The cement restrictor was measured and inserted distally.  The canal was then irrigated with the pulse lavage and 3 L of normal saline.  2 bags of Simplex cement were prepared.  Using the cement gun the cement was inserted distally and the canal was filled.  We then pressurized the canal. The real implant was then inserted.  The patient was noted  to only have about 15 degrees of native anteversion and we elected to increase anteversion for him to approximately 30 degrees.  We then waited for 13 minutes for the cement to be fully set.  Excess cement was removed.  A lap was placed in the acetabulum prior to cementing was also removed and the acetabulum was assessed to make sure there was no cement or bone fragments.  The real bipolar head ball was then assembled with a +4 inner ball and impacted onto the stem.  The hip was then reduced and taken through functional range of motion.  The hip was stable with external rotation and extension, position of sleep, but at neutral abduction and flexion to 90 degrees the hip was noted to start to sublux at approximately 50 degrees.  This was felt to be due to impingement of the greater trochanter on the acetabulum.  We elected to disassemble the bipolar head and switch to a +8 ball.  This was then impacted and stability was reassessed, and now felt that we had excellent stability with no subluxation at full extension 90 degrees external rotation, position of sleep, and neutral abduction hip flexion to 90 degrees and internal rotation to 80 degrees.  Leg lengths at  the knee level was restored.  The capsule was then repaired with #2 Ethibond.  I then irrigated the hip copiously again with pulse lavage. The fascia and IT band was repaired with #1 Vicryl and a 0 barbed suture,  followed by 2-0 Vicryl and running 3-0 Monocryl for the skin, Dermabond was applied and an Aquacel dressing was placed.  The patient was then awakened and returned to PACU in stable and satisfactory condition. There were no complications.  Post op recs: WB: WBAT with posterior hip precautions x6 weeks Abx: ancef x23 hours post op, followed by cefadroxil 500 twice daily for 7 days given increased risk for postop infection given the patient's medical comorbidities Dressing: Aquacel dressing to be kept intact until follow-up Imaging: PACU  xrays DVT prophylaxis: Eliquis 2.5 twice daily for postop day 1-2 followed by resuming full dose anticoagulation Eliquis 5 twice daily starting postop day 3 Follow up: 2 weeks after surgery for a wound check   Charlies Constable, MD Orthopedic Surgeon  06/04/2021 2:07 PM

## 2021-06-04 NOTE — Progress Notes (Signed)
Initial Nutrition Assessment  DOCUMENTATION CODES:  Not applicable  INTERVENTION:  Advance diet as medically able and as tolerated.  Add Ensure Plus High Protein po TID, each supplement provides 350 kcal and 20 grams of protein.   Add Rena-Vite daily.  Obtain updated measured weight.  NUTRITION DIAGNOSIS:  Increased nutrient needs related to post-op healing as evidenced by estimated needs.  GOAL:  Patient will meet greater than or equal to 90% of their needs  MONITOR:  PO intake, Supplement acceptance, Labs, Weight trends, Skin, I & O's  REASON FOR ASSESSMENT:  Consult Hip fracture protocol  ASSESSMENT:  70 yo male with a PMH of CAD, chronic systolic CHF (EF 79-15% on TEE done 09/18/2019), PVD, insulin-dependent type 2 diabetes, chronic right foot nonhealing wound status post right BKA in March 2022, ESRD status post failed renal transplant on HD MWF, hypertension, hyperlipidemia, LV mural thrombus in 2018 and LV apical thrombus in 2019 on Eliquis, history of CVA in December 2020 due to LV thrombus versus paroxysmal A. fib, vascular dementia. Admitted with L hip fracture.  Pt in OR during RD visit.  Pt's weight appears to be stated and copied. RD to order new measured weight to determine any weight changes.  RD suspects patient has some degree of malnutrition, but cannot definitively diagnose at this time.  Recommend adding Ensure TID and MVI with minerals daily.  Medications: reviewed; SSI, prednisone, Renvela TID, NaCl @ 10 ml/hr, LR @ 10 ml/hr   Labs: reviewed; CBG 96-105 HbA1c: 5.3% (06/04/2021)  NUTRITION - FOCUSED PHYSICAL EXAM: Unable to perform - defer to follow-up  Diet Order:   Diet Order             Diet NPO time specified Except for: Sips with Meds  Diet effective midnight                  EDUCATION NEEDS:  No education needs have been identified at this time  Skin:  Skin Assessment: Skin Integrity Issues: Skin Integrity Issues::  Incisions Incisions: L hip, closed  Last BM:  06/04/21  Height:  Ht Readings from Last 1 Encounters:  06/03/21 5\' 9"  (1.753 m)   Weight:  Wt Readings from Last 1 Encounters:  06/03/21 72.6 kg   BMI:  Body mass index is 23.63 kg/m.  Estimated Nutritional Needs:  Kcal:  1900-2100 Protein:  95-110 grams Fluid:  >1.9 L  Derrel Nip, RD, LDN (she/her/hers) Registered Dietitian I Pager #: [phone] After-Hours/Weekend Pager # in [place]

## 2021-06-05 DIAGNOSIS — I5022 Chronic systolic (congestive) heart failure: Secondary | ICD-10-CM | POA: Diagnosis not present

## 2021-06-05 DIAGNOSIS — S72002A Fracture of unspecified part of neck of left femur, initial encounter for closed fracture: Secondary | ICD-10-CM | POA: Diagnosis not present

## 2021-06-05 LAB — COMPREHENSIVE METABOLIC PANEL
ALT: 12 U/L (ref 0–44)
AST: 18 U/L (ref 15–41)
Albumin: 2.8 g/dL — ABNORMAL LOW (ref 3.5–5.0)
Alkaline Phosphatase: 50 U/L (ref 38–126)
Anion gap: 12 (ref 5–15)
BUN: 38 mg/dL — ABNORMAL HIGH (ref 8–23)
CO2: 28 mmol/L (ref 22–32)
Calcium: 8.4 mg/dL — ABNORMAL LOW (ref 8.9–10.3)
Chloride: 95 mmol/L — ABNORMAL LOW (ref 98–111)
Creatinine, Ser: 5.56 mg/dL — ABNORMAL HIGH (ref 0.61–1.24)
GFR, Estimated: 10 mL/min — ABNORMAL LOW (ref >60)
Glucose, Bld: 113 mg/dL — ABNORMAL HIGH (ref 70–99)
Potassium: 4.5 mmol/L (ref 3.5–5.1)
Sodium: 135 mmol/L (ref 135–145)
Total Bilirubin: 0.6 mg/dL (ref 0.3–1.2)
Total Protein: 5.5 g/dL — ABNORMAL LOW (ref 6.5–8.1)

## 2021-06-05 LAB — CBC WITH DIFFERENTIAL/PLATELET
Abs Immature Granulocytes: 0.05 10*3/uL (ref 0.00–0.07)
Basophils Absolute: 0 10*3/uL (ref 0.0–0.1)
Basophils Relative: 1 %
Eosinophils Absolute: 0.1 10*3/uL (ref 0.0–0.5)
Eosinophils Relative: 2 %
HCT: 27.2 % — ABNORMAL LOW (ref 39.0–52.0)
Hemoglobin: 8.8 g/dL — ABNORMAL LOW (ref 13.0–17.0)
Immature Granulocytes: 1 %
Lymphocytes Relative: 12 %
Lymphs Abs: 0.9 10*3/uL (ref 0.7–4.0)
MCH: 31.3 pg (ref 26.0–34.0)
MCHC: 32.4 g/dL (ref 30.0–36.0)
MCV: 96.8 fL (ref 80.0–100.0)
Monocytes Absolute: 0.4 10*3/uL (ref 0.1–1.0)
Monocytes Relative: 5 %
Neutro Abs: 6.2 10*3/uL (ref 1.7–7.7)
Neutrophils Relative %: 79 %
Platelets: 152 10*3/uL (ref 150–400)
RBC: 2.81 MIL/uL — ABNORMAL LOW (ref 4.22–5.81)
RDW: 17.5 % — ABNORMAL HIGH (ref 11.5–15.5)
WBC: 7.8 10*3/uL (ref 4.0–10.5)
nRBC: 0 % (ref 0.0–0.2)

## 2021-06-05 LAB — GLUCOSE, CAPILLARY
Glucose-Capillary: 109 mg/dL — ABNORMAL HIGH (ref 70–99)
Glucose-Capillary: 90 mg/dL (ref 70–99)
Glucose-Capillary: 200 mg/dL — ABNORMAL HIGH (ref 70–99)
Glucose-Capillary: 185 mg/dL — ABNORMAL HIGH (ref 70–99)
Glucose-Capillary: 161 mg/dL — ABNORMAL HIGH (ref 70–99)

## 2021-06-05 MED ORDER — CEFADROXIL 500 MG PO CAPS
500.0000 mg | ORAL_CAPSULE | ORAL | Status: DC
  Filled 2021-06-05: qty 1

## 2021-06-05 MED ORDER — NEPRO/CARBSTEADY PO LIQD
237.0000 mL | Freq: Two times a day (BID) | ORAL | Status: DC
  Administered 2021-06-05 – 2021-06-10 (×5): 237 mL via ORAL

## 2021-06-05 MED ORDER — CEFADROXIL 500 MG PO CAPS
500.0000 mg | ORAL_CAPSULE | Freq: Once | ORAL | Status: DC
  Filled 2021-06-05: qty 1

## 2021-06-05 MED ORDER — CEFADROXIL 500 MG PO CAPS
500.0000 mg | ORAL_CAPSULE | Freq: Two times a day (BID) | ORAL | Status: DC
  Administered 2021-06-05: 500 mg via ORAL
  Filled 2021-06-05: qty 1

## 2021-06-05 NOTE — Progress Notes (Signed)
PHARMACY NOTE:  ANTIMICROBIAL RENAL DOSAGE ADJUSTMENT  Current antimicrobial regimen includes a mismatch between antimicrobial dosage and estimated renal function.  As per policy approved by the Pharmacy & Therapeutics and Medical Executive Committees, the antimicrobial dosage will be adjusted accordingly.  Current antimicrobial dosage:  Cefadroxil 500mg  po BID  Indication: Post-op prophylaxis with increased risk of infection with pt comorbidities  Renal Function:  Estimated Creatinine Clearance: 11.2 mL/min (A) (by C-G formula based on SCr of 5.56 mg/dL (H)). [x]      On intermittent HD, scheduled: MWF    Antimicrobial dosage has been changed to:  cefadroxil 1gm loading dose then 500mg  q36h (if dose falls on HD day, give after HD)  Thank you for allowing pharmacy to be a part of this patient's care.  Sherlon Handing, PharmD, BCPS Please see amion for complete clinical pharmacist phone list 06/05/2021 10:53 PM

## 2021-06-05 NOTE — Plan of Care (Signed)

## 2021-06-05 NOTE — Progress Notes (Signed)
Inpatient Rehab Admissions Coordinator:   I spoke with pt and wife regarding potential CIR admit. His wife really wants CIR for him as she feels he needed it after BKA. She states family can provide 24/7 assist. I will send for insurance auth.   Clemens Catholic, [place], [place] Admissions Coordinator  [phone] ([place]) [phone] (office)

## 2021-06-05 NOTE — Progress Notes (Signed)
Inpatient Rehab Admissions Coordinator:  ? ?Per PT recommendation,  patient was screened for CIR candidacy by Aastha Dayley, MS, CCC-SLP. At this time, Pt. Appears to be a a potential candidate for CIR. I will place order for rehab consult per protocol for full assessment. Please contact me any with questions. ? ?Deakon Frix, MS, CCC-SLP ?Rehab Admissions Coordinator  ?[phone] (celll) ?[phone] (office) ? ?

## 2021-06-05 NOTE — Progress Notes (Signed)
CSW messaged that pt wife has questions about [facility] aide for after CIR or when pt returns home.  CSW spoke with wife Jonathan [name].  Pt has been screened for medicaid previously at [place], did not qualify.  She is aware that only medicaid will pay for [facility], plans to talk to DSS once more with some updated medical information. .  She asked about any free options.  CSW directed her to [email or web address]. For possible private pay caregivers.  Wife verbalized understanding. Lurline Idol, MSW, LCSW 10/29/20221:12 PM

## 2021-06-05 NOTE — Progress Notes (Signed)
Progress Note  Patient Name: Jonathan GALES Sr. Date of Encounter: 06/05/2021  [facility] Cardiologist: Charlynn Grimes MD [place]  Subjective   No CP or dyspnea  Inpatient Medications    Scheduled Meds:  allopurinol  100 mg Oral QHS   apixaban  2.5 mg Oral BID   [START ON 06/07/2021] apixaban  5 mg Oral BID   atorvastatin  40 mg Oral QHS   cefadroxil  500 mg Oral BID   Chlorhexidine Gluconate Cloth  6 each Topical Q0600   feeding supplement  237 mL Oral TID BM   folic acid  1 mg Oral Daily   insulin aspart  0-9 Units Subcutaneous Q4H   multivitamin  1 tablet Oral QHS   predniSONE  5 mg Oral Q breakfast   sevelamer carbonate  800 mg Oral TID WC   Continuous Infusions:   ceFAZolin (ANCEF) IV     PRN Meds: HYDROcodone-acetaminophen, HYDROmorphone (DILAUDID) injection, polyethylene glycol, senna-docusate   Vital Signs    Vitals:   06/04/21 2043 06/05/21 0100 06/05/21 0612 06/05/21 0829  BP: 124/68 125/65 132/71 (!) 122/52  Pulse: 88  89 85  Resp: 16  16 18   Temp: 98.1 F (36.7 C) 98.7 F (37.1 C) 98.7 F (37.1 C) 98.8 F (37.1 C)  TempSrc: Oral Axillary Oral Oral  SpO2: 94% 94% 92% 95%  Weight:      Height:        Intake/Output Summary (Last 24 hours) at 06/05/2021 0846 Last data filed at 06/04/2021 2016 Gross per 24 hour  Intake 700 ml  Output 100 ml  Net 600 ml   Last 3 Weights 06/04/2021 06/04/2021 06/03/2021  Weight (lbs) 136 lb 11 oz 135 lb 9.3 oz 160 lb  Weight (kg) 62 kg 61.5 kg 72.576 kg      Telemetry    Sinus with PVCs- Personally Reviewed   Physical Exam   GEN: No acute distress.   Neck: No JVD Cardiac: RRR Respiratory: Clear to auscultation bilaterally. GI: Soft, nontender, non-distended  MS: Status post right BKA, status post surgery on the left Neuro:  Nonfocal  Psych: Flat affect  Labs    High Sensitivity Troponin:   Recent Labs  Lab 06/04/21 0459 06/04/21 0818  TROPONINIHS 80* 75*     Chemistry Recent  Labs  Lab 06/04/21 0459 06/04/21 0818 06/05/21 0320  NA 137 135 135  K 4.7 4.7 4.5  CL 95* 94* 95*  CO2 30 25 28   GLUCOSE 101* 100* 113*  BUN 62* 64* 38*  CREATININE 8.83* 9.07* 5.56*  CALCIUM 8.8* 8.7* 8.4*  PROT  --   --  5.5*  ALBUMIN  --   --  2.8*  AST  --   --  18  ALT  --   --  12  ALKPHOS  --   --  50  BILITOT  --   --  0.6  GFRNONAA 6* 6* 10*  ANIONGAP 12 16* 12     Hematology Recent Labs  Lab 06/04/21 0818 06/04/21 1412 06/05/21 0320  WBC 6.9 6.7 7.8  RBC 3.29* 3.05* 2.81*  HGB 10.2* 9.6* 8.8*  HCT 31.6* 29.8* 27.2*  MCV 96.0 97.7 96.8  MCH 31.0 31.5 31.3  MCHC 32.3 32.2 32.4  RDW 17.5* 17.7* 17.5*  PLT 164 157 152   Recent Labs  Lab 06/04/21 0506  BNP 2,665.2*    Radiology    DG Chest 1 View  Result Date: 06/03/2021 CLINICAL DATA:  Fall with hip fracture EXAM: CHEST  1 VIEW COMPARISON:  09/15/2019, 07/30/2019 FINDINGS: Chronic elevation of left diaphragm. Diffuse bilateral reticular interstitial opacity consistent with chronic lung disease. Mild cardiomegaly with aortic atherosclerosis. No pleural effusion, pneumothorax, or acute airspace disease. IMPRESSION: Chronic interstitial lung disease.  No acute airspace disease. Electronically Signed   By: Donavan Foil M.D.   On: 06/03/2021 16:00   DG Pelvis Portable  Result Date: 06/04/2021 CLINICAL DATA:  Postop left hemiarthroplasty EXAM: PORTABLE PELVIS 1-2 VIEWS COMPARISON:  Left hip radiographs 06/03/2021 FINDINGS: There has been interval left hip arthroplasty for repair of a previously seen femoral neck fracture. Hardware alignment is within expected limits, without evidence of hardware related complication. Right hip alignment is stable. The SI joints and symphysis pubis are intact. Extensive vascular calcifications and a right femoral stent are noted. IMPRESSION: Status post left hip arthroplasty without evidence of complication. Electronically Signed   By: Valetta Mole M.D.   On: 06/04/2021 15:50    ECHOCARDIOGRAM COMPLETE  Result Date: 06/04/2021    ECHOCARDIOGRAM REPORT   Patient Name:   Jonathan VANHORN Sr. Date of Exam: 06/04/2021 Medical Rec #:  [id number]          Height:       69.0 in Accession #:    [id number]         Weight:       160.0 lb Date of Birth:  09-Dec-1950          BSA:          1.879 m Patient Age:    70 years           BP:           142/62 mmHg Patient Gender: M                  HR:           91 bpm. Exam Location:  Inpatient Procedure: 2D Echo, Cardiac Doppler, Color Doppler and Intracardiac            Opacification Agent Indications:     Apical Thrombus  History:         Patient has prior history of Echocardiogram examinations, most                  recent 08/01/2019. Risk Factors:Diabetes, Hypertension and                  Family History of Coronary Artery Disease.  Sonographer:     Helmut Muster Referring Phys:  [id number] [place] Diagnosing Phys: Mertie Moores MD IMPRESSIONS  1. With Definity contrast, an apical thrombus is visualized in the LV apex.         . Left ventricular ejection fraction, by estimation, is 30 to 35%. The left ventricle has moderately decreased function. The left ventricle demonstrates regional wall motion abnormalities (see scoring diagram/findings for description). The left ventricular internal cavity size was mildly to moderately dilated. Left ventricular diastolic parameters are consistent with Grade II diastolic dysfunction (pseudonormalization). There is severe akinesis of the left ventricular, mid-apical anteroseptal wall and apical segment.  2. Right ventricular systolic function is normal. The right ventricular size is normal.  3. Left atrial size was moderately dilated.  4. Right atrial size was mildly dilated.  5. A small pericardial effusion is present.  6. The mitral valve is grossly normal. Severe mitral valve regurgitation.  7. The aortic valve is grossly normal. Aortic valve regurgitation is  not visualized. FINDINGS  Left Ventricle:  With Definity contrast, an apical thrombus is visualized in the LV apex. Left ventricular ejection fraction, by estimation, is 30 to 35%. The left ventricle has moderately decreased function. The left ventricle demonstrates regional wall motion abnormalities. Severe akinesis of the left ventricular, mid-apical anteroseptal wall and apical segment. Definity contrast agent was given IV to delineate the left ventricular endocardial borders. The left ventricular internal cavity size was mildly to moderately dilated. There is no left ventricular hypertrophy. Left ventricular diastolic parameters are consistent with Grade II diastolic dysfunction (pseudonormalization). Right Ventricle: The right ventricular size is normal. Right vetricular wall thickness was not well visualized. Right ventricular systolic function is normal. Left Atrium: Left atrial size was moderately dilated. Right Atrium: Right atrial size was mildly dilated. Pericardium: A small pericardial effusion is present. Mitral Valve: The mitral valve is grossly normal. Severe mitral valve regurgitation. Tricuspid Valve: The tricuspid valve is grossly normal. Tricuspid valve regurgitation is not demonstrated. Aortic Valve: The aortic valve is grossly normal. Aortic valve regurgitation is not visualized. Pulmonic Valve: The pulmonic valve was not well visualized. Pulmonic valve regurgitation is not visualized. Aorta: The aortic root and ascending aorta are structurally normal, with no evidence of dilitation. IAS/Shunts: The atrial septum is grossly normal. Additional Comments: A device lead is visualized.  LEFT VENTRICLE PLAX 2D LVIDd:         5.00 cm      Diastology LVIDs:         4.20 cm      LV e' medial:    6.66 cm/s LV PW:         1.00 cm      LV E/e' medial:  19.1 LV IVS:        1.00 cm      LV e' lateral:   9.14 cm/s LVOT diam:     1.90 cm      LV E/e' lateral: 13.9 LV SV:         48 LV SV Index:   25 LVOT Area:     2.84 cm  LV Volumes (MOD) LV vol d,  MOD A2C: 257.0 ml LV vol d, MOD A4C: 205.0 ml LV vol s, MOD A2C: 173.0 ml LV vol s, MOD A4C: 161.0 ml LV SV MOD A2C:     84.0 ml LV SV MOD A4C:     205.0 ml LV SV MOD BP:      66.5 ml RIGHT VENTRICLE             IVC RV S prime:     11.70 cm/s  IVC diam: 2.20 cm TAPSE (M-mode): 2.1 cm LEFT ATRIUM             Index        RIGHT ATRIUM           Index LA diam:        5.00 cm 2.66 cm/m   RA Area:     18.40 cm LA Vol (A2C):   72.0 ml 38.32 ml/m  RA Volume:   51.40 ml  27.35 ml/m LA Vol (A4C):   97.2 ml 51.73 ml/m LA Biplane Vol: 84.1 ml 44.76 ml/m  AORTIC VALVE LVOT Vmax:   104.00 cm/s LVOT Vmean:  68.500 cm/s LVOT VTI:    0.169 m  AORTA Ao Root diam: 2.90 cm Ao Asc diam:  3.30 cm MITRAL VALVE MV Area (PHT): 4.71 cm       SHUNTS MV Decel  Time: 161 msec       Systemic VTI:  0.17 m MR Peak grad:    162.3 mmHg   Systemic Diam: 1.90 cm MR Mean grad:    97.0 mmHg MR Vmax:         637.00 cm/s MR Vmean:        460.0 cm/s MR PISA:         5.09 cm MR PISA Eff ROA: 25 mm MR PISA Radius:  0.90 cm MV E velocity: 127.00 cm/s MV A velocity: 105.00 cm/s MV E/A ratio:  1.21 Mertie Moores MD Electronically signed by Mertie Moores MD Signature Date/Time: 06/04/2021/10:06:57 AM    Final    DG Hip Unilat With Pelvis 2-3 Views Left  Result Date: 06/03/2021 CLINICAL DATA:  Fall EXAM: DG HIP (WITH OR WITHOUT PELVIS) 2-3V LEFT COMPARISON:  None. FINDINGS: Extensive vascular calcifications. Pubic symphysis and rami appear intact. Acute left femoral neck fracture with cranial displacement of trochanter. Femoral head projects in joint. IMPRESSION: Acute mildly displaced left femoral neck fracture Electronically Signed   By: Donavan Foil M.D.   On: 06/03/2021 15:58    Patient Profile     69 y.o. male with past medical history of chronic systolic congestive heart failure, coronary artery disease, peripheral vascular disease, diabetes mellitus, end-stage renal disease, hypertension, hyperlipidemia, history of LV mural thrombus,  prior CVA, paroxysmal H fibrillation, dementia admitted with hip fracture.  Had left hemiarthroplasty.  Echocardiogram shows ejection fraction 30 to 35%, apical thrombus, mild left ventricular enlargement, grade 2 diastolic dysfunction, biatrial enlargement, small pericardial effusion, severe mitral regurgitation.  Assessment & Plan    1 history of left ventricular apical thrombus-apixaban has resumed.  His home dose should be 5 mg twice daily.  2 coronary artery disease-we will resume Lipitor at discharge (40 mg daily).  3 ischemic cardiomyopathy-patient is not on cardiac medications.  This may be secondary to history of hypotension at time of dialysis.  This will need to be reassessed at time of outpatient follow-up.  4 end-stage renal disease-dialysis per nephrology.  5 status post repair of hip fracture-Per orthopedics.  Okay to DC telemetry.  Cardiology will sign off.  Please call with questions.  Would continue preadmission medications at discharge.  For questions or updates, please contact [place] Please consult [email or web address] for contact info under        Signed, Kirk Ruths, MD  06/05/2021, 8:46 AM

## 2021-06-05 NOTE — Progress Notes (Addendum)
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 Zachery Dakins 10/28 Weightbearing as tolerated, prophylactic cefadroxil for 7 days Resume Eliquis lower dose 2.5 twice daily for first 1 to 2 days and then home dose to 5 mg twice daily Pain control hydrocodone 1 every 6 as needed, Dilaudid 0.5 every 4 as needed severe pain as outpatient consider Likely will need skilled placement HFrEF EF 30 to 81%, grade 2 diastolic dysfunction with severe left ventricular akinesis MI at age 75 Prior apical thrombus and ventricle previously on Eliquis  Chronic anticoagulation as per cardiology see above discussion Not on beta-blocker or ACE inhibitor? Continue Lipitor 40 daily Echo unchanged from prior--no fruther work-up/intervention IP Expected blood loss anemia from surgery Follow trends with labs and obtain iron studies a.m. 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  Paroxysmal A. Fib Not on rate control-keep on monitors perioperatively   DVT prophylaxis: Perioperative heparin Code Status: Full code Family Communication: Discussed with wife at the bedside Disposition:  Status is: Inpatient  Remains inpatient appropriate because: Surgery Anticipate will need skilled placement.       Consultants:  Orthopedics Cardiology  Procedures:   Antimicrobials:     Subjective:  Awake coherent eating breakfast responsive but not very interactive Does follow commands is able to wiggle toes on his left leg Does not complain of any overt pain Wife hoping for inpatient rehab stay  Objective: Vitals:   06/04/21 2043 06/05/21 0100 06/05/21 0612 06/05/21 0829  BP: 124/68 125/65 132/71 (!) 122/52  Pulse: 88  89 85  Resp: 16  16 18   Temp: 98.1 F (36.7 C) 98.7 F (37.1 C) 98.7 F (37.1 C) 98.8 F (37.1 C)  TempSrc: Oral Axillary Oral Oral  SpO2: 94% 94% 92% 95%  Weight:      Height:        Intake/Output Summary (Last 24 hours) at 06/05/2021 0951 Last data filed at 06/04/2021 2016 Gross per 24 hour  Intake 700 ml  Output 100 ml  Net 600 ml   Filed Weights   06/03/21 2300 06/04/21 1600 06/04/21 2016  Weight: 72.6 kg 61.5 kg 62 kg    Examination:  Awake alert coherent no distress EOMI NCAT moderate dentition  Chest clear no rales no rhonchi S1-S2 no murmur no rub no gallop ROM intact no focal deficit Neurologically intact moving 4 limbs equally without deficit Amputation stump on the right side-postop changes to left hip  Data Reviewed:  personally reviewed   CBC    Component Value Date/Time   WBC 7.8 06/05/2021 0320   RBC 2.81 (L) 06/05/2021 0320   HGB 8.8 (L) 06/05/2021 0320   HCT 27.2 (L) 06/05/2021 0320   PLT 152 06/05/2021 0320   MCV 96.8 06/05/2021 0320   MCH 31.3 06/05/2021 0320   MCHC 32.4 06/05/2021 0320   RDW 17.5 (H) 06/05/2021 0320   LYMPHSABS 0.9 06/05/2021 0320   MONOABS 0.4 06/05/2021 0320   EOSABS 0.1 06/05/2021 0320   BASOSABS 0.0 06/05/2021 0320   CMP Latest Ref Rng & Units 06/05/2021 06/04/2021 06/04/2021  Glucose 70 - 99 mg/dL 113(H) 100(H) 101(H)  BUN 8 - 23 mg/dL 38(H) 64(H) 62(H)  Creatinine 0.61 - 1.24 mg/dL 5.56(H) 9.07(H) 8.83(H)  Sodium 135 - 145 mmol/L 135 135 137  Potassium 3.5 - 5.1 mmol/L 4.5 4.7 4.7  Chloride 98 - 111 mmol/L 95(L) 94(L) 95(L)  CO2 22 - 32 mmol/L 28 25 30   Calcium 8.9 - 10.3 mg/dL 8.4(L) 8.7(L) 8.8(L)  Total Protein 6.5 - 8.1 g/dL 5.5(L) - -  Total Bilirubin 0.3 - 1.2 mg/dL 0.6 - -  Alkaline Phos 38 - 126 U/L 50 - -  AST 15 - 41 U/L 18 - -  ALT 0 - 44 U/L 12 - -     Radiology Studies: DG Chest 1 View  Result Date: 06/03/2021 CLINICAL DATA:  Fall with hip fracture EXAM: CHEST  1 VIEW COMPARISON:  09/15/2019, 07/30/2019 FINDINGS: Chronic elevation of left diaphragm. Diffuse bilateral reticular interstitial opacity consistent with chronic lung disease. Mild cardiomegaly with aortic atherosclerosis. No pleural effusion, pneumothorax, or acute airspace disease. IMPRESSION: Chronic interstitial lung disease.  No acute airspace disease. Electronically Signed   By: Donavan Foil M.D.   On: 06/03/2021 16:00   DG Pelvis Portable  Result Date: 06/04/2021 CLINICAL DATA:  Postop left hemiarthroplasty EXAM: PORTABLE PELVIS 1-2 VIEWS COMPARISON:  Left hip radiographs 06/03/2021 FINDINGS: There has been interval left hip arthroplasty for repair of a previously seen femoral neck fracture. Hardware alignment is within expected limits, without evidence of hardware related  complication. Right hip alignment is stable. The SI joints and symphysis pubis are intact. Extensive vascular calcifications and a right femoral stent are noted. IMPRESSION: Status post left hip arthroplasty without evidence of complication. Electronically Signed   By: Valetta Mole M.D.   On: 06/04/2021 15:50   ECHOCARDIOGRAM COMPLETE  Result Date: 06/04/2021    ECHOCARDIOGRAM REPORT   Patient Name:   Jonathan CLINK Sr. Date of Exam: 06/04/2021 Medical Rec #:  [id number]          Height:       69.0 in Accession #:    [id number]         Weight:       160.0 lb Date of Birth:  1951/02/05          BSA:          1.879 m Patient Age:    55 years           BP:           142/62 mmHg Patient Gender: M                  HR:  91 bpm. Exam Location:  Inpatient Procedure: 2D Echo, Cardiac Doppler, Color Doppler and Intracardiac            Opacification Agent Indications:     Apical Thrombus  History:         Patient has prior history of Echocardiogram examinations, most                  recent 08/01/2019. Risk Factors:Diabetes, Hypertension and                  Family History of Coronary Artery Disease.  Sonographer:     Helmut Muster Referring Phys:  [id number] [place] Diagnosing Phys: Mertie Moores MD IMPRESSIONS  1. With Definity contrast, an apical thrombus is visualized in the LV apex.         . Left ventricular ejection fraction, by estimation, is 30 to 35%. The left ventricle has moderately decreased function. The left ventricle demonstrates regional wall motion abnormalities (see scoring diagram/findings for description). The left ventricular internal cavity size was mildly to moderately dilated. Left ventricular diastolic parameters are consistent with Grade II diastolic dysfunction (pseudonormalization). There is severe akinesis of the left ventricular, mid-apical anteroseptal wall and apical segment.  2. Right ventricular systolic function is normal. The right ventricular size is normal.  3. Left  atrial size was moderately dilated.  4. Right atrial size was mildly dilated.  5. A small pericardial effusion is present.  6. The mitral valve is grossly normal. Severe mitral valve regurgitation.  7. The aortic valve is grossly normal. Aortic valve regurgitation is not visualized. FINDINGS  Left Ventricle: With Definity contrast, an apical thrombus is visualized in the LV apex. Left ventricular ejection fraction, by estimation, is 30 to 35%. The left ventricle has moderately decreased function. The left ventricle demonstrates regional wall motion abnormalities. Severe akinesis of the left ventricular, mid-apical anteroseptal wall and apical segment. Definity contrast agent was given IV to delineate the left ventricular endocardial borders. The left ventricular internal cavity size was mildly to moderately dilated. There is no left ventricular hypertrophy. Left ventricular diastolic parameters are consistent with Grade II diastolic dysfunction (pseudonormalization). Right Ventricle: The right ventricular size is normal. Right vetricular wall thickness was not well visualized. Right ventricular systolic function is normal. Left Atrium: Left atrial size was moderately dilated. Right Atrium: Right atrial size was mildly dilated. Pericardium: A small pericardial effusion is present. Mitral Valve: The mitral valve is grossly normal. Severe mitral valve regurgitation. Tricuspid Valve: The tricuspid valve is grossly normal. Tricuspid valve regurgitation is not demonstrated. Aortic Valve: The aortic valve is grossly normal. Aortic valve regurgitation is not visualized. Pulmonic Valve: The pulmonic valve was not well visualized. Pulmonic valve regurgitation is not visualized. Aorta: The aortic root and ascending aorta are structurally normal, with no evidence of dilitation. IAS/Shunts: The atrial septum is grossly normal. Additional Comments: A device lead is visualized.  LEFT VENTRICLE PLAX 2D LVIDd:         5.00 cm       Diastology LVIDs:         4.20 cm      LV e' medial:    6.66 cm/s LV PW:         1.00 cm      LV E/e' medial:  19.1 LV IVS:        1.00 cm      LV e' lateral:   9.14 cm/s LVOT diam:     1.90 cm  LV E/e' lateral: 13.9 LV SV:         48 LV SV Index:   25 LVOT Area:     2.84 cm  LV Volumes (MOD) LV vol d, MOD A2C: 257.0 ml LV vol d, MOD A4C: 205.0 ml LV vol s, MOD A2C: 173.0 ml LV vol s, MOD A4C: 161.0 ml LV SV MOD A2C:     84.0 ml LV SV MOD A4C:     205.0 ml LV SV MOD BP:      66.5 ml RIGHT VENTRICLE             IVC RV S prime:     11.70 cm/s  IVC diam: 2.20 cm TAPSE (M-mode): 2.1 cm LEFT ATRIUM             Index        RIGHT ATRIUM           Index LA diam:        5.00 cm 2.66 cm/m   RA Area:     18.40 cm LA Vol (A2C):   72.0 ml 38.32 ml/m  RA Volume:   51.40 ml  27.35 ml/m LA Vol (A4C):   97.2 ml 51.73 ml/m LA Biplane Vol: 84.1 ml 44.76 ml/m  AORTIC VALVE LVOT Vmax:   104.00 cm/s LVOT Vmean:  68.500 cm/s LVOT VTI:    0.169 m  AORTA Ao Root diam: 2.90 cm Ao Asc diam:  3.30 cm MITRAL VALVE MV Area (PHT): 4.71 cm       SHUNTS MV Decel Time: 161 msec       Systemic VTI:  0.17 m MR Peak grad:    162.3 mmHg   Systemic Diam: 1.90 cm MR Mean grad:    97.0 mmHg MR Vmax:         637.00 cm/s MR Vmean:        460.0 cm/s MR PISA:         5.09 cm MR PISA Eff ROA: 25 mm MR PISA Radius:  0.90 cm MV E velocity: 127.00 cm/s MV A velocity: 105.00 cm/s MV E/A ratio:  1.21 Mertie Moores MD Electronically signed by Mertie Moores MD Signature Date/Time: 06/04/2021/10:06:57 AM    Final    DG Hip Unilat With Pelvis 2-3 Views Left  Result Date: 06/03/2021 CLINICAL DATA:  Fall EXAM: DG HIP (WITH OR WITHOUT PELVIS) 2-3V LEFT COMPARISON:  None. FINDINGS: Extensive vascular calcifications. Pubic symphysis and rami appear intact. Acute left femoral neck fracture with cranial displacement of trochanter. Femoral head projects in joint. IMPRESSION: Acute mildly displaced left femoral neck fracture Electronically Signed   By: Donavan Foil M.D.   On: 06/03/2021 15:58     Scheduled Meds:  allopurinol  100 mg Oral QHS   apixaban  2.5 mg Oral BID   [START ON 06/07/2021] apixaban  5 mg Oral BID   atorvastatin  40 mg Oral QHS   cefadroxil  500 mg Oral BID   Chlorhexidine Gluconate Cloth  6 each Topical Q0600   feeding supplement  237 mL Oral TID BM   feeding supplement (NEPRO CARB STEADY)  237 mL Oral BID BM   folic acid  1 mg Oral Daily   insulin aspart  0-9 Units Subcutaneous Q4H   multivitamin  1 tablet Oral QHS   predniSONE  5 mg Oral Q breakfast   sevelamer carbonate  800 mg Oral TID WC   Continuous Infusions:   ceFAZolin (ANCEF) IV  LOS: 2 days   Time spent: [place], MD [facility] To contact the attending provider between 7A-7P or the covering provider during after hours 7P-7A, please log into the web site [email or web address] and access using universal [place] password for that web site. If you do not have the password, please call the hospital operator.  06/05/2021, 9:51 AM

## 2021-06-05 NOTE — Progress Notes (Signed)
     Subjective:  Patient reports pain as well controlled this morning.  His wife is at bedside.  He does not have his prosthesis to be able to work with PT but his wife said she will go home and get it.  No acute issues  Objective:   VITALS:   Vitals:   06/04/21 2043 06/05/21 0100 06/05/21 0612 06/05/21 0829  BP: 124/68 125/65 132/71 (!) 122/52  Pulse: 88  89 85  Resp: 16  16 18   Temp: 98.1 F (36.7 C) 98.7 F (37.1 C) 98.7 F (37.1 C) 98.8 F (37.1 C)  TempSrc: Oral Axillary Oral Oral  SpO2: 94% 94% 92% 95%  Weight:      Height:        Sensation intact distally Dorsiflexion/Plantar flexion intact Incision: dressing C/D/I, no strikethrough on Aquacel dressing Moderate expected postoperative swelling over the lateral hip Compartment soft    Lab Results  Component Value Date   WBC 7.8 06/05/2021   HGB 8.8 (L) 06/05/2021   HCT 27.2 (L) 06/05/2021   MCV 96.8 06/05/2021   PLT 152 06/05/2021   BMET    Component Value Date/Time   NA 135 06/05/2021 0320   K 4.5 06/05/2021 0320   CL 95 (L) 06/05/2021 0320   CO2 28 06/05/2021 0320   GLUCOSE 113 (H) 06/05/2021 0320   BUN 38 (H) 06/05/2021 0320   CREATININE 5.56 (H) 06/05/2021 0320   CALCIUM 8.4 (L) 06/05/2021 0320   GFRNONAA 10 (L) 06/05/2021 0320     Assessment/Plan: 1 Day Post-Op   Principal Problem:   Hip fracture ([name]) Active Problems:   History of coronary artery disease   ESRD (end stage renal disease) on dialysis ([name])   Hyperlipidemia   CHF (congestive heart failure) ([name])  Left hip hemiarthroplasty for femoral neck fracture  Post op recs: WB: WBAT with posterior hip precautions x6 weeks Abx: ancef x23 hours post op, followed by cefadroxil 500 twice daily for 7 days given increased risk for postop infection given the patient's medical comorbidities Dressing: Aquacel dressing to be kept intact until follow-up Imaging: PACU xrays DVT prophylaxis: Eliquis 2.5 twice daily for postop day 1-2 followed  by resuming full dose anticoagulation Eliquis 5 twice daily starting postop day 3 Follow up: 2 weeks after surgery for Jonathan wound check    Jonathan [name] Jonathan [name] 06/05/2021, 9:08 AM   Jonathan Constable, MD Cell [phone]  Contact information:   [id number] 7am-5pm epic message Dr. Zachery Dakins, or call office for patient follow up: [phone] After hours and holidays please check [email or web address] for group call information for [facility]

## 2021-06-05 NOTE — Plan of Care (Signed)

## 2021-06-05 NOTE — Progress Notes (Addendum)
Subjective: No complaints said tolerated dialysis yesterday on schedule  Objective Vital signs in last 24 hours: Vitals:   06/04/21 2043 06/05/21 0100 06/05/21 0612 06/05/21 0829  BP: 124/68 125/65 132/71 (!) 122/52  Pulse: 88  89 85  Resp: 16  16 18   Temp: 98.1 F (36.7 C) 98.7 F (37.1 C) 98.7 F (37.1 C) 98.8 F (37.1 C)  TempSrc: Oral Axillary Oral Oral  SpO2: 94% 94% 92% 95%  Weight:      Height:       Weight change: -11.1 kg  Physical Exam: General: Alert, soft spoken WDWN elderly male NAD Lungs: CTA nonlabored  Heart: RRR, no MRG  Abdomen: NABS soft, non-tender, non-distended  Lower extremities: Right BKA, 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 180NRe, 4 hour 54min, BFR 400, DFR Auto 1.5, EDW 61.7kg, 2K, 2Ca, AVF 15g Heparin 3000 unit bolus  Calcitriol 1.20mcg PO q HD   Problem/Plan  Left femoral neck fracture: Orthopedic surgery status post 10/28 surgery  per ortho  ESRD:  Dialysis on MWF schedule,  Hypertension/volume: No volume overload on exam, BP moderately elevated.  Yesterday, stable now after dialysis, no BP meds  Anemia: Hgb 10.2, not on ESA outpatient, follow post-op  Metabolic bone disease: Calcium controlled. Continue calcitriol and binders.   Nutrition: ALB 2.8 protein supplement to diet, change regular to renal/carb modified CAD/a.fib/hx of LV thrombus: cardiology consulting cleared for surgery yesterday, no chest pain or dyspnea, back on apixaban, he is stable /they signed off today Right arm swelling - wife states it started last week after dialysis and got worse over the weekend.  We will ask IR for fistulogram during his hospitalization if he will be in CIR.     Ernest Haber, PA-C [facility]per [phone] 06/05/2021,8:39 AM  LOS: 2 days   Labs: Basic Metabolic Panel: Recent Labs  Lab 06/04/21 0459 06/04/21 0818 06/05/21 0320   NA 137 135 135  K 4.7 4.7 4.5  CL 95* 94* 95*  CO2 30 25 28   GLUCOSE 101* 100* 113*  BUN 62* 64* 38*  CREATININE 8.83* 9.07* 5.56*  CALCIUM 8.8* 8.7* 8.4*   Liver Function Tests: Recent Labs  Lab 06/05/21 0320  AST 18  ALT 12  ALKPHOS 50  BILITOT 0.6  PROT 5.5*  ALBUMIN 2.8*   No results for input(s): LIPASE, AMYLASE in the last 168 hours. No results for input(s): AMMONIA in the last 168 hours. CBC: Recent Labs  Lab 06/03/21 1527 06/03/21 1952 06/04/21 0818 06/04/21 1412 06/05/21 0320  WBC 7.1  --  6.9 6.7 7.8  NEUTROABS 5.9  --   --   --  6.2  HGB 12.5*   < > 10.2* 9.6* 8.8*  HCT 37.7*   < > 31.6* 29.8* 27.2*  MCV 95.9  --  96.0 97.7 96.8  PLT 113*  --  164 157 152   < > = values in this interval not displayed.   Cardiac Enzymes: No results for input(s): CKTOTAL, CKMB, CKMBINDEX, TROPONINI in the last 168 hours. CBG: Recent Labs  Lab 06/04/21 1359 06/04/21 1405 06/04/21 2046 06/05/21 0054 06/05/21 0632  GLUCAP 102* 103* 99 109* 90    Medications:   ceFAZolin (ANCEF) IV      allopurinol  100 mg Oral QHS   apixaban  2.5 mg Oral BID   [START ON 06/07/2021] apixaban  5  mg Oral BID   atorvastatin  40 mg Oral QHS   cefadroxil  500 mg Oral BID   Chlorhexidine Gluconate Cloth  6 each Topical Q0600   feeding supplement  237 mL Oral TID BM   folic acid  1 mg Oral Daily   insulin aspart  0-9 Units Subcutaneous Q4H   multivitamin  1 tablet Oral QHS   predniSONE  5 mg Oral Q breakfast   sevelamer carbonate  800 mg Oral TID WC   I have seen and examined this patient and agree with plan and assessment in the above note with renal recommendations/intervention highlighted.  Broadus John A Mana Morison,MD 06/05/2021 11:28 AM

## 2021-06-05 NOTE — Evaluation (Signed)
Physical Therapy Evaluation Patient Details Name: Jonathan [name] Sr. MRN: [id number] DOB: March 17, 1951 Today's Date: 06/05/2021  History of Present Illness  70 y.o. male with past medical history of chronic systolic congestive heart failure, coronary artery disease, peripheral vascular disease, diabetes mellitus, end-stage renal disease, hypertension, hyperlipidemia, history of LV mural thrombus, prior CVA, paroxysmal H fibrillation, dementia admitted with hip fracture.  S/p left hip hemiarthroplast 06/04/21.  Clinical Impression  Patient is s/p above surgery resulting in functional limitations due to the deficits listed below (see PT Problem List). Requires +2 assist Mod-Max for bed mobility and sit<>stand (wife is going to bring prosthesis for RLE and anticipate improved mobility with this.) Previously progressing well at OP PT neuro rehab working with new prosthesis and ambulating short distances with RW. Wife very supportive however, works and cannot provide 24 hr assistance at this time, takes transport to dialysis MWF- would greatly benefit from CIR. Patient will benefit from skilled PT to increase their independence and safety with mobility to allow discharge to the venue listed below.          Recommendations for follow up therapy are one component of a multi-disciplinary discharge planning process, led by the attending physician.  Recommendations may be updated based on patient status, additional functional criteria and insurance authorization.  Follow Up Recommendations Acute inpatient rehab (3hours/day)    Assistance Recommended at Discharge Frequent or constant Supervision/Assistance  Functional Status Assessment Patient has had a recent decline in their functional status and demonstrates the ability to make significant improvements in function in a reasonable and predictable amount of time.  Equipment Recommendations  Other (comment) (TBD next venue of care)    Recommendations for  Other Services Rehab consult     Precautions / Restrictions Precautions Precautions: Posterior Hip Precaution Booklet Issued: Yes (comment) Precaution Comments: Reviewed with pt and wife Required Braces or Orthoses:  (Rt BKA - has prosthetic) Restrictions Weight Bearing Restrictions: Yes LLE Weight Bearing: Weight bearing as tolerated Other Position/Activity Restrictions: Posterior hip precautions      Mobility  Bed Mobility Overal bed mobility: Needs Assistance Bed Mobility: Supine to Sit;Sit to Supine     Supine to sit: Mod assist;+2 for physical assistance Sit to supine: Mod assist;+2 for physical assistance   General bed mobility comments: Trunk assist and LLE support to rise and lower to/from EOB. Mod assist +2 using bed pad to scoot. Pt able to assist with trunk flexion and UEs which are pretty strong. Eventually showed posterior lean preference as he fatigued with slight pushing (wife reports baseline with anxiety.) Practiced scooting in bed with slight bridge once supine - required assist from therapist to maintain hip precautions on Lt - bed pad pulled to [facility] with max A +2 however pt able to pull with UEs when cued.    Transfers Overall transfer level: Needs assistance Equipment used: Rolling walker (2 wheels) Transfers: Sit to/from Stand Sit to Stand: Max assist;+2 physical assistance;From elevated surface           General transfer comment: Bed elevated Max assist +2 for boost to stand, difficulty fully extending LLE - no prosthesis available at initial eval however wife is going to bring for further follow-ups. VC for sequencing and technique. Tolerated 3 trials of standing, <15 sec, and ea stand progressively shorter duration. Started to push posteriorly. Attempted lateral pivot along bed, pt anxious and leaning backwards despite cues and support.    Ambulation/Gait  Stairs            Wheelchair Mobility    Modified Rankin (Stroke  Patients Only)       Balance Overall balance assessment: Needs assistance Sitting-balance support: Bilateral upper extremity supported Sitting balance-Leahy Scale: Fair Sitting balance - Comments: As pt fatigued he started leaning posteriorly but may have an anxiety component as well per wife at baseline when standing. Postural control: Posterior lean Standing balance support: Bilateral upper extremity supported Standing balance-Leahy Scale: Zero Standing balance comment: max assist to remain standing, able to bear weight through LLE pretty well and BIL hands on RW.                             Pertinent Vitals/Pain Pain Assessment: PAINAD Breathing: normal Negative Vocalization: none Facial Expression: sad, frightened, frown Body Language: tense, distressed pacing, fidgeting Consolability: distracted or reassured by voice/touch PAINAD Score: 3 Pain Location: Lt hip Pain Descriptors / Indicators: Operative site guarding Pain Intervention(s): Limited activity within patient's tolerance;Monitored during session;Repositioned    Home Living Family/patient expects to be discharged to:: Private residence Living Arrangements: Spouse/significant other;Children;Other relatives Available Help at Discharge: Family;Available PRN/intermittently (wife works) Type of Home: House Home Access: Stairs to enter Entrance Stairs-Rails: None Technical brewer of Steps: 1 Alternate Level Stairs-Number of Steps: 12 Home Layout: Two level;Bed/bath upstairs;1/2 bath on main level;Able to live on main level with bedroom/bathroom Home Equipment: Rolling Walker (2 wheels);Rollator (4 wheels);Cane - single point;Wheelchair - manual Additional Comments: history provided by wife - [employer] MWF through [employer] [facility]    Prior Function Prior Level of Function : Needs assist  Cognitive Assist : Mobility (cognitive) Mobility (Cognitive): Step by step cues ADLs (Cognitive): Step by step  cues Physical Assist : Mobility (physical) Mobility (physical): Bed mobility;Transfers;Gait;Stairs           Hand Dominance   Dominant Hand: Right    Extremity/Trunk Assessment   Upper Extremity Assessment Upper Extremity Assessment: Defer to OT evaluation    Lower Extremity Assessment Lower Extremity Assessment: RLE deficits/detail;LLE deficits/detail RLE Deficits / Details: Hx Lt BKA - wearing shrinker sock LLE Deficits / Details: Lt hip posterior precautions. LLE: Unable to fully assess due to pain       Communication   Communication: Expressive difficulties  Cognition Arousal/Alertness: Lethargic Behavior During Therapy: Flat affect Overall Cognitive Status: History of cognitive impairments - at baseline                                 General Comments: Alert to self, situation, unaware of date (month year)        General Comments General comments (skin integrity, edema, etc.): Wife present, very supportive, educated on posterior hip precautions.    Exercises General Exercises - Lower Extremity Ankle Circles/Pumps: AROM;Left;20 reps;Supine Quad Sets: Strengthening;Both;10 reps;Supine Short Arc Quad: Strengthening;Both;10 reps;Supine;AAROM   Assessment/Plan    PT Assessment Patient needs continued PT services  PT Problem List Decreased strength;Decreased range of motion;Decreased activity tolerance;Decreased balance;Decreased mobility;Decreased coordination;Decreased cognition;Decreased knowledge of use of DME;Decreased safety awareness;Decreased knowledge of precautions;Pain       PT Treatment Interventions DME instruction;Gait training;Functional mobility training;Therapeutic activities;Therapeutic exercise;Balance training;Neuromuscular re-education;Patient/family education;Cognitive remediation;Wheelchair mobility training    PT Goals (Current goals can be found in the Care Plan section)  Acute Rehab PT Goals Patient Stated Goal: none  stated PT Goal Formulation: With family Time For  Goal Achievement: 06/19/21 Potential to Achieve Goals: Good    Frequency Min 5X/week   Barriers to discharge Decreased caregiver support wife works    Co-evaluation PT/OT/SLP Co-Evaluation/Treatment: Yes Reason for Co-Treatment: Complexity of the patient's impairments (multi-system involvement);Necessary to address cognition/behavior during functional activity;For patient/therapist safety;To address functional/ADL transfers PT goals addressed during session: Mobility/safety with mobility;Balance;Proper use of DME;Strengthening/ROM         AM-PAC PT "6 Clicks" Mobility  Outcome Measure Help needed turning from your back to your side while in a flat bed without using bedrails?: A Lot Help needed moving from lying on your back to sitting on the side of a flat bed without using bedrails?: A Lot Help needed moving to and from a bed to a chair (including a wheelchair)?: Total Help needed standing up from a chair using your arms (e.g., wheelchair or bedside chair)?: Total Help needed to walk in hospital room?: Total Help needed climbing 3-5 steps with a railing? : Total 6 Click Score: 8    End of Session Equipment Utilized During Treatment: Gait belt Activity Tolerance: Patient tolerated treatment well Patient left: in bed;with call bell/phone within reach;with bed alarm set;with family/visitor present;with SCD's reapplied Nurse Communication: Need for lift equipment;Mobility status;Precautions (Handout for hip precautions hanging in room.) PT Visit Diagnosis: Unsteadiness on feet (R26.81);Muscle weakness (generalized) (M62.81);History of falling (Z91.81);Difficulty in walking, not elsewhere classified (R26.2);Pain Pain - Right/Left: Left Pain - part of body: Hip    Time: 7793-9030 PT Time Calculation (min) (ACUTE ONLY): 43 min   Charges:   PT Evaluation $PT Eval Moderate Complexity: 1 Mod PT Treatments $Therapeutic Activity:  8-22 mins        Elayne Snare, PT, DPT  Ellouise Newer 06/05/2021, 10:00 AM

## 2021-06-05 NOTE — Evaluation (Signed)
Occupational Therapy Evaluation Patient Details Name: Jonathan NAJJAR Sr. MRN: [id number] DOB: 10-20-1950 Today's Date: 06/05/2021   History of Present Illness 70 y.o. male with past medical history of chronic systolic congestive heart failure, coronary artery disease, peripheral vascular disease, diabetes mellitus, end-stage renal disease, hypertension, hyperlipidemia, history of LV mural thrombus, prior CVA, paroxysmal H fibrillation, dementia admitted with hip fracture.  S/p left hip hemiarthroplast 06/04/21.   Clinical Impression   Pt admitted with the above diagnoses and presents with below problem list. Pt will benefit from continued acute OT to address the below listed deficits and maximize independence with basic ADLs prior to d/c to venue below. PTA pt needs assistance with bathing/dressing, has been working on mobility with OP PT neuro rehab. Pt currently needs max A +2 to stand from EOB with continued +2 assist to maintain static standing balance. Pt practiced sit<>stand 3x from EOB. Spouse present throughout session and involved in pt's care, included in pt education.        Recommendations for follow up therapy are one component of a multi-disciplinary discharge planning process, led by the attending physician.  Recommendations may be updated based on patient status, additional functional criteria and insurance authorization.   Follow Up Recommendations  Acute inpatient rehab (3hours/day)    Assistance Recommended at Discharge Intermittent Supervision/Assistance  Functional Status Assessment  Patient has had a recent decline in their functional status and demonstrates the ability to make significant improvements in function in a reasonable and predictable amount of time.  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 Mod assist +2 using bed pad to scoot. Pt able to assist with trunk flexion and UEs which are pretty strong. Eventually showed posterior lean preference as he fatigued with slight pushing (wife reports baseline with anxiety.) Practiced scooting in bed with slight bridge once supine - required assist from therapist to maintain hip precautions on Lt - bed pad pulled to [facility] with max A +2 however pt able to pull with UEs when cued.    Transfers Overall transfer level: Needs assistance Equipment used: Rolling walker (2 wheels) Transfers: Sit to/from Stand Sit to Stand: Max assist;+2 physical assistance;From elevated surface           General transfer comment: Bed elevated Max assist +2 for boost to stand, difficulty fully extending LLE - no prosthesis available at initial eval however wife is going to bring for further follow-ups. VC for sequencing and technique. Tolerated 3 trials of standing, <15 sec, and ea stand progressively shorter duration. Started to push posteriorly. Attempted lateral pivot along bed, pt anxious and leaning backwards despite cues and support.      Balance Overall balance assessment: Needs assistance Sitting-balance support: Bilateral upper extremity supported Sitting balance-Leahy Scale: Fair Sitting balance - Comments: As pt fatigued he started leaning posteriorly but  may have an anxiety  component as well per wife at baseline when standing. Postural control: Posterior lean Standing balance support: Bilateral upper extremity supported Standing balance-Leahy Scale: Zero Standing balance comment: max assist to remain standing, able to bear weight through LLE pretty well and BIL hands on RW.                           ADL either performed or assessed with clinical judgement   ADL Overall ADL's : Needs assistance/impaired Eating/Feeding: Minimal assistance   Grooming: Maximal assistance   Upper Body Bathing: Maximal assistance   Lower Body Bathing: Maximal assistance;+2 for physical assistance;Sit to/from stand   Upper Body Dressing : Maximal assistance   Lower Body Dressing: Maximal assistance;Sit to/from stand                 General ADL Comments: 3x sit<>stands from EOB, rest breaks incorporated. Able to tolerate standing up to 15 seconds, max A +2 (zero standing balance). Will need Hoyer for safe transfers in/out of bed at this time.     Vision         Perception     Praxis      Pertinent Vitals/Pain Pain Assessment: PAINAD Breathing: normal Negative Vocalization: none Facial Expression: sad, frightened, frown Body Language: tense, distressed pacing, fidgeting Consolability: distracted or reassured by voice/touch PAINAD Score: 3 Pain Location: Lt hip Pain Descriptors / Indicators: Operative site guarding Pain Intervention(s): Monitored during session;Limited activity within patient's tolerance;Repositioned     Hand Dominance Right   Extremity/Trunk Assessment Upper Extremity Assessment Upper Extremity Assessment: Generalized weakness   Lower Extremity Assessment Lower Extremity Assessment: Defer to PT evaluation RLE Deficits / Details: Hx Lt BKA - wearing shrinker sock LLE Deficits / Details: Lt hip posterior precautions. LLE: Unable to fully assess due to pain       Communication Communication Communication: Expressive  difficulties   Cognition Arousal/Alertness: Lethargic Behavior During Therapy: Flat affect Overall Cognitive Status: History of cognitive impairments - at baseline                                 General Comments: Alert to self, situation, unaware of date (month year). minimal verbalizations, all history provided by spouse     General Comments  Wife present, very supportive, educated on posterior hip precautions    Exercises Exercises: General Lower Extremity General Exercises - Lower Extremity Ankle Circles/Pumps: AROM;Left;20 reps;Supine Quad Sets: Strengthening;Both;10 reps;Supine Short Arc Quad: Strengthening;Both;10 reps;Supine;AAROM   Shoulder Instructions      Home Living Family/patient expects to be discharged to:: Private residence Living Arrangements: Spouse/significant other;Children;Other relatives Available Help at Discharge: Family;Available PRN/intermittently (wife works) Type of Home: House Home Access: Stairs to enter Technical brewer of Steps: 1 Entrance Stairs-Rails: None Home Layout: Two level;Bed/bath upstairs;1/2 bath on main level;Able to live on main level with bedroom/bathroom Alternate Level Stairs-Number of Steps: 12 Alternate Level Stairs-Rails: Right Bathroom Shower/Tub: Teacher, early years/pre: Standard     Home Equipment: Conservation officer, nature (2 wheels);Rollator (4 wheels);Cane - single point;Wheelchair - manual   Additional Comments: history provided by wife - [employer] MWF through [place] bus      Prior Functioning/Environment Prior Level of Function : Needs assist  Cognitive Assist : Mobility (cognitive) Mobility (Cognitive): Step by step cues ADLs (Cognitive): Step by step cues Physical Assist : Mobility (physical);ADLs (physical) Mobility (physical): Bed  mobility;Transfers;Gait;Stairs     ADLs Comments: spouse reports she bathes and dresses pt        OT Problem List: Decreased strength;Decreased activity  tolerance;Impaired balance (sitting and/or standing);Decreased range of motion;Decreased cognition;Decreased safety awareness;Decreased knowledge of use of DME or AE;Decreased knowledge of precautions;Pain      OT Treatment/Interventions: Self-care/ADL training;DME and/or AE instruction;Therapeutic activities;Patient/family education;Balance training    OT Goals(Current goals can be found in the care plan section) Acute Rehab OT Goals Patient Stated Goal: spouse: rehab then home with increased home health services OT Goal Formulation: With family Time For Goal Achievement: 06/19/21 Potential to Achieve Goals: Good ADL Goals Pt Will Perform Lower Body Bathing: with mod assist;sit to/from stand Pt Will Perform Lower Body Dressing: with mod assist;sit to/from stand Pt Will Transfer to Toilet: with min assist;stand pivot transfer;bedside commode Pt Will Perform Toileting - Clothing Manipulation and hygiene: with min assist;sit to/from stand Additional ADL Goal #1: Pt will complete bed mobility at mod A level to prepare for EOB/OOB ADLs.  OT Frequency: Min 2X/week   Barriers to D/C:            Co-evaluation PT/OT/SLP Co-Evaluation/Treatment: Yes Reason for Co-Treatment: Complexity of the patient's impairments (multi-system involvement);Necessary to address cognition/behavior during functional activity;For patient/therapist safety;To address functional/ADL transfers PT goals addressed during session: Mobility/safety with mobility;Balance;Proper use of DME;Strengthening/ROM OT goals addressed during session: ADL's and self-care      AM-PAC OT "6 Clicks" Daily Activity     Outcome Measure Help from another person eating meals?: A Little Help from another person taking care of personal grooming?: A Lot Help from another person toileting, which includes using toliet, bedpan, or urinal?: Total Help from another person bathing (including washing, rinsing, drying)?: Total Help from another  person to put on and taking off regular upper body clothing?: A Lot Help from another person to put on and taking off regular lower body clothing?: Total 6 Click Score: 10   End of Session Equipment Utilized During Treatment: Rolling walker (2 wheels)  Activity Tolerance: Patient limited by pain;Patient limited by fatigue Patient left: in bed;with call bell/phone within reach;with bed alarm set;with family/visitor present;with SCD's reapplied (LLE SCD)  OT Visit Diagnosis: Unsteadiness on feet (R26.81);Muscle weakness (generalized) (M62.81);Pain;Other abnormalities of gait and mobility (R26.89);History of falling (Z91.81);Other symptoms and signs involving cognitive function Pain - Right/Left: Left Pain - part of body: Hip                Time: 2536-6440 OT Time Calculation (min): 29 min Charges:  OT General Charges $OT Visit: 1 Visit OT Evaluation $OT Eval Moderate Complexity: [name], OT Acute Rehabilitation Services Pager: [phone] Office: [phone]   Hortencia Pilar 06/05/2021, 12:40 PM

## 2021-06-06 DIAGNOSIS — S72002A Fracture of unspecified part of neck of left femur, initial encounter for closed fracture: Secondary | ICD-10-CM | POA: Diagnosis not present

## 2021-06-06 LAB — GLUCOSE, CAPILLARY
Glucose-Capillary: 162 mg/dL — ABNORMAL HIGH (ref 70–99)
Glucose-Capillary: 125 mg/dL — ABNORMAL HIGH (ref 70–99)
Glucose-Capillary: 141 mg/dL — ABNORMAL HIGH (ref 70–99)
Glucose-Capillary: 196 mg/dL — ABNORMAL HIGH (ref 70–99)
Glucose-Capillary: 209 mg/dL — ABNORMAL HIGH (ref 70–99)

## 2021-06-06 LAB — CBC WITH DIFFERENTIAL/PLATELET
Abs Immature Granulocytes: 0.04 10*3/uL (ref 0.00–0.07)
Basophils Absolute: 0 10*3/uL (ref 0.0–0.1)
Basophils Relative: 0 %
Eosinophils Absolute: 0.2 10*3/uL (ref 0.0–0.5)
Eosinophils Relative: 3 %
HCT: 18.9 % — ABNORMAL LOW (ref 39.0–52.0)
Hemoglobin: 6.4 g/dL — CL (ref 13.0–17.0)
Immature Granulocytes: 1 %
Lymphocytes Relative: 14 %
Lymphs Abs: 1.1 10*3/uL (ref 0.7–4.0)
MCH: 32.5 pg (ref 26.0–34.0)
MCHC: 33.9 g/dL (ref 30.0–36.0)
MCV: 95.9 fL (ref 80.0–100.0)
Monocytes Absolute: 0.5 10*3/uL (ref 0.1–1.0)
Monocytes Relative: 7 %
Neutro Abs: 6 10*3/uL (ref 1.7–7.7)
Neutrophils Relative %: 75 %
Platelets: 144 10*3/uL — ABNORMAL LOW (ref 150–400)
RBC: 1.97 MIL/uL — ABNORMAL LOW (ref 4.22–5.81)
RDW: 17.5 % — ABNORMAL HIGH (ref 11.5–15.5)
WBC: 7.9 10*3/uL (ref 4.0–10.5)
nRBC: 0 % (ref 0.0–0.2)

## 2021-06-06 LAB — RENAL FUNCTION PANEL
Albumin: 2.4 g/dL — ABNORMAL LOW (ref 3.5–5.0)
Anion gap: 10 (ref 5–15)
BUN: 69 mg/dL — ABNORMAL HIGH (ref 8–23)
CO2: 28 mmol/L (ref 22–32)
Calcium: 8.3 mg/dL — ABNORMAL LOW (ref 8.9–10.3)
Chloride: 94 mmol/L — ABNORMAL LOW (ref 98–111)
Creatinine, Ser: 7.37 mg/dL — ABNORMAL HIGH (ref 0.61–1.24)
GFR, Estimated: 7 mL/min — ABNORMAL LOW (ref >60)
Glucose, Bld: 176 mg/dL — ABNORMAL HIGH (ref 70–99)
Phosphorus: 5.9 mg/dL — ABNORMAL HIGH (ref 2.5–4.6)
Potassium: 4.4 mmol/L (ref 3.5–5.1)
Sodium: 132 mmol/L — ABNORMAL LOW (ref 135–145)

## 2021-06-06 LAB — PREPARE RBC (CROSSMATCH)

## 2021-06-06 LAB — HEMOGLOBIN AND HEMATOCRIT, BLOOD
HCT: 22.5 % — ABNORMAL LOW (ref 39.0–52.0)
Hemoglobin: 7.5 g/dL — ABNORMAL LOW (ref 13.0–17.0)

## 2021-06-06 MED ORDER — SODIUM CHLORIDE 0.9% IV SOLUTION: Freq: Once | INTRAVENOUS | Status: DC

## 2021-06-06 NOTE — PMR Pre-admission (Signed)
PMR Admission Coordinator Pre-Admission Assessment  Patient: Jonathan [name]. is an 70 y.o., male MRN: [id number] DOB: 1951/03/02 Height: 5' 9"  (175.3 cm) Weight: 62 kg  Insurance Information HMO:     PPO:      PCP:      IPA:      80/20:      OTHER:  PRIMARY: [employer] [place] [employer] [place]     Policy#: [id number]      Subscriber: Pt CM Name: Denyce Robert     Phone#:  [phone]  Fax#: [phone] Pre-Cert#: [id number] with updates due on 06/14/21    Employer:  Benefits:  Phone #: [phone]    Name: Jenetta Loges Date: 08/08/2014 - still active Deductible: $150 ($150 met) OOP Max: $1,500 ($1,500 met) CIR: 95% coverage, 5% co-insurance SNF: 95% coverage, 5% co-insurance; limited to 100 days/cal yr (100 remaining) Outpatient: 95% coverage, 5% co-insurance Home Health: 95% coverage, 5% co-insurance DME: 95% coverage, 5% co-insurance Providers: in network  SECONDARY:       Policy#:      Phone#:    Development worker, community:       Phone#:   The Engineer, petroleum" for patients in Inpatient Rehabilitation Facilities with attached "Privacy Act [place] Records" was provided and verbally reviewed with: Family  Emergency Contact Information Contact Information     Name Relation Home Work Mobile   Tyvon, Eggenberger Spouse   [phone]   Davontae, Prusinski [phone]         Current Medical History  Patient Admitting Diagnosis: L Hip fx  History of Present Illness: Jonathan [name]. is a 70 y.o. 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 fib, vascular dementia.  He  presents to the ED 06/03/21  via EMS complaining of left hip pain after a mechanical fall.  Labs showing WBC 7.1, hemoglobin 12.5 (stable), platelet count 113k (was 88k on 03/12/2021).  Patient was a hard stick and not enough blood collected to process BMP.  I-STAT chemistry showing sodium 136, potassium 4.5, chloride 94, bicarb 31, BUN 51, creatinine 8.4, glucose 119.  COVID and influenza PCR negative.  X-ray of left hip/pelvis showing acute mildly displaced left femoral neck fracture.  Chest x-ray showing chronic interstitial lung disease; no acute airspace disease . L  hip hemiarthroplasty  performed by ortho 06/04/21. Stay complicated by ABL and Pt. Received 1 unit PRBCs 06/06/21. CIR consulted to assist return to PLOF.    Patient's medical record from [facility] has been reviewed by the rehabilitation admission coordinator and physician.  Past Medical History  Past Medical History:  Diagnosis Date   CAD (coronary artery disease)    CHF (congestive heart failure) ([name])    Diabetes mellitus without complication ([place])    type 2   DM (diabetes mellitus) ([place])    ESRD (end stage renal disease) ([place])    FUO (fever of unknown origin) 09/17/2019   Hyperlipidemia    Hypertension    Osteomyelitis ([place])    Peripheral arterial disease ([place])    PVD (peripheral vascular disease) ([place])    Renal disorder 2015   right kidney transplant  Stroke [facility])    Vascular dementia [facility])     Has the patient had major surgery during 100 days prior to admission? Yes  Family History   family history includes Cancer in his mother; Diabetes in his son; Diabetes Mellitus II in his brother and mother; Healthy in his daughter; Other in his father.  Current Medications  Current Facility-Administered Medications:    0.9 %  sodium chloride infusion (Manually program via Guardrails IV Fluids), , Intravenous, Once, Hall, Carole N, DO   allopurinol (ZYLOPRIM) tablet 100 mg, 100 mg, Oral, QHS, Willaim Sheng, MD, 100 mg at 06/05/21 2113   apixaban (ELIQUIS) tablet 2.5 mg, 2.5 mg, Oral, BID, Willaim Sheng, MD, 2.5 mg at 06/05/21 2113   [START ON 06/07/2021] apixaban (ELIQUIS) tablet 5 mg, 5 mg, Oral, BID, Marchwiany, Virgina Norfolk, MD   atorvastatin (LIPITOR) tablet 40 mg, 40 mg, Oral, QHS, Willaim Sheng, MD, 40 mg at 06/05/21 2113   cefadroxil (DURICEF) capsule 500 mg, 500 mg, Oral, Once, Franky Macho, RPH   [START ON 06/07/2021] cefadroxil (DURICEF) capsule 500 mg, 500 mg, Oral, Q36H, Franky Macho, RPH   Chlorhexidine Gluconate Cloth 2 % PADS 6 each, 6 each, Topical, Q0600, Willaim Sheng, MD, 6 each at 06/06/21 [phone]   feeding supplement (ENSURE ENLIVE / ENSURE PLUS) liquid 237 mL, 237 mL, Oral, TID BM, Samtani, Jai-Gurmukh, MD, 237 mL at 06/05/21 2115   feeding supplement (NEPRO CARB STEADY) liquid 237 mL, 237 mL, Oral, BID BM, Ernest Haber, PA-C, 237 mL at 00/17/49 4496   folic acid (FOLVITE) tablet 1 mg, 1 mg, Oral, Daily, Willaim Sheng, MD, 1 mg at 06/05/21 7591   HYDROcodone-acetaminophen (NORCO/VICODIN) 5-325 MG per tablet 1 tablet, 1 tablet, Oral, Q6H PRN, Willaim Sheng, MD, 1 tablet at 06/05/21 1504   HYDROmorphone (DILAUDID) injection 0.5 mg, 0.5 mg, Intravenous, Q4H PRN, Willaim Sheng, MD   insulin aspart (novoLOG) injection 0-9 Units, 0-9 Units, Subcutaneous, Q4H, Willaim Sheng, MD, 2 Units at 06/06/21 0421   multivitamin (RENA-VIT) tablet 1 tablet, 1 tablet, Oral, QHS, Samtani, Jai-Gurmukh, MD, 1 tablet at 06/05/21 2113   polyethylene glycol (MIRALAX / GLYCOLAX) packet 17 g, 17 g, Oral, Daily PRN, Willaim Sheng, MD   predniSONE (DELTASONE) tablet 5 mg, 5 mg, Oral, Q breakfast, Willaim Sheng, MD, 5 mg at 06/05/21 6384   senna-docusate (Senokot-S) tablet 1 tablet, 1 tablet, Oral, BID PRN, Willaim Sheng, MD   sevelamer carbonate (RENVELA) tablet 800 mg, 800 mg, Oral, TID WC, Willaim Sheng, MD, 800 mg at  06/05/21 1831  Patients Current Diet:  Diet Order             Diet renal/carb modified with fluid restriction Diet-HS Snack? Nothing; Fluid restriction: 1200 mL Fluid; Room service appropriate? Yes with Assist; Fluid consistency: Thin  Diet effective now                   Precautions / Restrictions Precautions Precautions: Posterior Hip Precaution Booklet Issued: Yes (comment) Precaution Comments: Reviewed with pt and wife Restrictions Weight Bearing Restrictions: Yes LLE Weight Bearing: Weight bearing as tolerated Other Position/Activity Restrictions: Posterior hip precautions   Has the patient had 2 or more falls or a fall with injury in the past year? Yes  Prior Activity Level Limited Community (1-2x/wk): Pt. went out for HD an appointments  Prior Functional Level Self Care: Did the patient need help bathing, dressing, using the toilet  or eating? Needed some help  Indoor Mobility: Did the patient need assistance with walking from room to room (with or without device)? Needed some help  Stairs: Did the patient need assistance with internal or external stairs (with or without device)? Needed some help  Functional Cognition: Did the patient need help planning regular tasks such as shopping or remembering to take medications? Dependent  Patient Information Are you of Hispanic, Latino/a,or Spanish origin?: A. No, not of Hispanic, Latino/a, or Spanish origin What is your race?: B. Black or African American Do you need or want an interpreter to communicate with a doctor or health care staff?: 0. No  Patient's Response To:  Health Literacy and Transportation Is the patient able to respond to health literacy and transportation needs?: No  Home Assistive Devices / [place] Devices/Equipment: Environmental consultant (specify type), Bedside commode/3-in-1, Wheelchair, Prosthesis, Shower chair with back Home Equipment: Conservation officer, nature (2 wheels), Rollator (4 wheels), Cane -  single point, Wheelchair - manual  Prior Device Use: Indicate devices/aids used by the patient prior to current illness, exacerbation or injury? Manual wheelchair and Walker  Current Functional Level Cognition  Overall Cognitive Status: History of cognitive impairments - at baseline Orientation Level: Oriented to person, Oriented to place General Comments: Alert to self, situation, unaware of date (month year). minimal verbalizations, all history provided by spouse    Extremity Assessment (includes Sensation/Coordination)  Upper Extremity Assessment: Generalized weakness  Lower Extremity Assessment: Defer to PT evaluation RLE Deficits / Details: Hx Lt BKA - wearing shrinker sock LLE Deficits / Details: Lt hip posterior precautions. LLE: Unable to fully assess due to pain    ADLs  Overall ADL's : Needs assistance/impaired Eating/Feeding: Minimal assistance Grooming: Maximal assistance Upper Body Bathing: Maximal assistance Lower Body Bathing: Maximal assistance, +2 for physical assistance, Sit to/from stand Upper Body Dressing : Maximal assistance Lower Body Dressing: Maximal assistance, Sit to/from stand General ADL Comments: 3x sit<>stands from EOB, rest breaks incorporated. Able to tolerate standing up to 15 seconds, max A +2 (zero standing balance). Will need [place] for safe transfers in/out of bed at this time.    Mobility  Overal bed mobility: Needs Assistance Bed Mobility: Supine to Sit, Sit to Supine Supine to sit: Mod assist, +2 for physical assistance Sit to supine: Mod assist, +2 for physical assistance General bed mobility comments: Trunk assist and LLE support to rise and lower to/from EOB. Mod assist +2 using bed pad to scoot. Pt able to assist with trunk flexion and UEs which are pretty strong. Eventually showed posterior lean preference as he fatigued with slight pushing (wife reports baseline with anxiety.) Practiced scooting in bed with slight bridge once supine -  required assist from therapist to maintain hip precautions on Lt - bed pad pulled to [facility] with max A +2 however pt able to pull with UEs when cued.    Transfers  Overall transfer level: Needs assistance Equipment used: Rolling walker (2 wheels) Transfers: Sit to/from Stand Sit to Stand: Max assist, +2 physical assistance, From elevated surface General transfer comment: Bed elevated Max assist +2 for boost to stand, difficulty fully extending LLE - no prosthesis available at initial eval however wife is going to bring for further follow-ups. VC for sequencing and technique. Tolerated 3 trials of standing, <15 sec, and ea stand progressively shorter duration. Started to push posteriorly. Attempted lateral pivot along bed, pt anxious and leaning backwards despite cues and support.    Ambulation / Gait / Stairs /  Wheelchair Mobility       Posture / Balance Dynamic Sitting Balance Sitting balance - Comments: As pt fatigued he started leaning posteriorly but may have an anxiety component as well per wife at baseline when standing. Balance Overall balance assessment: Needs assistance Sitting-balance support: Bilateral upper extremity supported Sitting balance-Leahy Scale: Fair Sitting balance - Comments: As pt fatigued he started leaning posteriorly but may have an anxiety component as well per wife at baseline when standing. Postural control: Posterior lean Standing balance support: Bilateral upper extremity supported Standing balance-Leahy Scale: Zero Standing balance comment: max assist to remain standing, able to bear weight through LLE pretty well and BIL hands on RW.    Special needs/care consideration Skin L post op hip dressing, Diabetic management Yes DM, and Special service needs ESRD HD M-W-F   Previous Home Environment (from acute therapy documentation) Living Arrangements: Spouse/significant other, Children, Other relatives Available Help at Discharge: Family, Available  PRN/intermittently (wife works) Type of Home: [place]: Two level, Bed/bath upstairs, 1/2 bath on main level, Able to live on main level with bedroom/bathroom Alternate Level Stairs-Rails: Right Alternate Level Stairs-Number of Steps: 12 Home Access: Stairs to enter Entrance Stairs-Rails: None Entrance Stairs-Number of Steps: 1 Bathroom Shower/Tub: Chiropodist: [place]: No Additional Comments: history provided by wife - [place] MWF through [place] bus  Discharge Living Setting Plans for Discharge Living Setting: Patient's home Type of Home at Discharge: House Discharge Home Layout: Two level, Bed/bath upstairs, Able to live on main level with bedroom/bathroom Alternate Level Stairs-Rails: Right Alternate Level Stairs-Number of Steps: 12 Discharge Home Access: Stairs to enter Entrance Stairs-Rails: None Entrance Stairs-Number of Steps: 1 Discharge Bathroom Shower/Tub: Tub/shower unit Discharge Bathroom Toilet: Standard Discharge Bathroom Accessibility: Yes How Accessible: Accessible via walker Does the patient have any problems obtaining your medications?: No  Social/Family/Support Systems Patient Roles: Spouse Contact Information: [phone] Anticipated Caregiver: Lamount Bankson Anticipated Caregiver's Contact Information: [phone] Ability/Limitations of Caregiver: Can provide Mod A Caregiver Availability: 24/7 Discharge Plan Discussed with Primary Caregiver: Yes Is Caregiver In Agreement with Plan?: Yes Does Caregiver/Family have Issues with Lodging/Transportation while Pt is in Rehab?: No  Goals Patient/Family Goal for Rehab: PT/OT Mod A Expected length of stay: 18-22 days Pt/Family Agrees to Admission and willing to participate: Yes Program Orientation Provided & Reviewed with Pt/Caregiver Including Roles  & Responsibilities: Yes  Decrease burden of Care through IP rehab admission: Specialzed equipment needs,  Decrease number of caregivers, Bowel and bladder program, and Patient/family education  Possible need for SNF placement upon discharge: not anticipated  Patient Condition: I have reviewed medical records from [facility], spoken with CM, and patient and spouse. I met with patient at the bedside for inpatient rehabilitation assessment.  Patient will benefit from ongoing PT and OT, can actively participate in 3 hours of therapy a day 5 days of the week, and can make measurable gains during the admission.  Patient will also benefit from the coordinated team approach during an Inpatient Acute Rehabilitation admission.  The patient will receive intensive therapy as well as Rehabilitation physician, nursing, social worker, and care management interventions.  Due to bladder management, bowel management, safety, skin/wound care, disease management, medication administration, and patient education the patient requires 24 hour a day rehabilitation nursing.  The patient is currently mod to max assist with mobility and basic ADLs.  Discharge setting and therapy post discharge at home with home health is anticipated.  Patient has agreed to  participate in the Acute Inpatient Rehabilitation Program and will admit today.  Preadmission Screen Completed By:  Genella Mech, 06/06/2021 9:45 AM and updated today by Jodell Cipro, RN ______________________________________________________________________   Discussed status with Dr. [name] on 06/10/21 at 10:00 am and received approval for admission today.  Admission Coordinator:  Genella Mech, CCC-SLP, time 1024/Date 06/10/21   Assessment/Plan: Diagnosis: Does the need for close, 24 hr/day Medical supervision in concert with the patient's rehab needs make it unreasonable for this patient to be served in a less intensive setting? Yes Co-Morbidities requiring supervision/potential complications: demtnia- vascular; ESRD on HD; R BKA 3/22; L hip  hemiarthroplasty Due to bladder management, bowel management, safety, skin/wound care, disease management, medication administration, pain management, and patient education, does the patient require 24 hr/day rehab nursing? Yes Does the patient require coordinated care of a physician, rehab nurse, PT, OT, and SLP to address physical and functional deficits in the context of the above medical diagnosis(es)? Yes Addressing deficits in the following areas: balance, endurance, locomotion, strength, transferring, bowel/bladder control, bathing, dressing, feeding, grooming, and toileting Can the patient actively participate in an intensive therapy program of at least 3 hrs of therapy 5 days a week? Yes The potential for patient to make measurable gains while on inpatient rehab is good and fair Anticipated functional outcomes upon discharge from inpatient rehab: min assist and mod assist PT, min assist and mod assist OT, n/a SLP Estimated rehab length of stay to reach the above functional goals is: 18-22 days Anticipated discharge destination: Home 10. Overall Rehab/Functional Prognosis: good and fair   MD Signature:

## 2021-06-06 NOTE — Consult Note (Signed)
Chief Complaint: Patient was seen in consultation today for right upper ar dialysis fistula evaluation and possible intervention Chief Complaint  Patient presents with   Hip Pain   at the request of Dr Marval Regal   Supervising Physician: Juliet Rude  Patient Status: [facility] - In-pt  History of Present Illness: Jonathan [name]. is a 70 y.o. male   ESRD Admitted to [facility] 10/28 with left hip pain + fracture Patient underwent hemiarthroplasty 10/28  Wife asking about RUE swelling for few weeks Completes dialysis without issue-- but swelling not resolving Nephrology requesting RUA fistula evaluation and possible intervention Planned in IR 10/31  Last intervention of this fistula was "few months ago at [place]"-- per wife   Past Medical History:  Diagnosis Date   CAD (coronary artery disease)    CHF (congestive heart failure) ([place])    Diabetes mellitus without complication ([place])    type 2   DM (diabetes mellitus) ([place])    ESRD (end stage renal disease) ([place])    FUO (fever of unknown origin) 09/17/2019   Hyperlipidemia    Hypertension    Osteomyelitis ([place])    Peripheral arterial disease ([place])    PVD (peripheral vascular disease) ([place])    Renal disorder 2015   right kidney transplant   Stroke [facility])    Vascular dementia [facility])     Past Surgical History:  Procedure Laterality Date   ABDOMINAL AORTOGRAM N/A 08/05/2020   Procedure: ABDOMINAL AORTOGRAM;  Surgeon: [name], MD;  Location: [place] [facility];  Service: Cardiovascular;  Laterality: N/A;   ABDOMINAL AORTOGRAM W/LOWER EXTREMITY N/A 09/09/2020   Procedure: ABDOMINAL AORTOGRAM W/LOWER EXTREMITY;  Surgeon: [name], MD;  Location: [place] [facility];  Service: Cardiovascular;  Laterality: N/A;   AMPUTATION Right 08/06/2020   Procedure: Right Fifth Toe Ray Amputation;  Surgeon: [name], MD;  Location: [place];  Service: Vascular;  Laterality: Right;   AMPUTATION Right 11/02/2020    Procedure: RIGHT AMPUTATION BELOW KNEE;  Surgeon: [name], MD;  Location: [place];  Service: Vascular;  Laterality: Right;   BACK SURGERY     Has had 2 back surgeries   BUBBLE STUDY  09/18/2019   Procedure: BUBBLE STUDY;  Surgeon: Buford Dresser, MD;  Location: [place];  Service: Cardiovascular;;   Depression     ENDARTERECTOMY FEMORAL Right 08/06/2020   Procedure: Right Ilio- Femoral Artery Endarterectomy;  Surgeon: [name], MD;  Location: [facility] OR;  Service: Vascular;  Laterality: Right;   HAND SURGERY Right    HAND SURGERY     IR REMOVAL TUN CV CATH W/O [facility]  09/03/2019   KIDNEY TRANSPLANT     November 2015   LEG AMPUTATION BELOW KNEE Right    LOWER EXTREMITY ANGIOGRAPHY Bilateral 08/05/2020   Procedure: LOWER EXTREMITY ANGIOGRAPHY;  Surgeon: [name], MD;  Location: [place] [facility];  Service: Cardiovascular;  Laterality: Bilateral;   Memory loss     NEPHRECTOMY TRANSPLANTED ORGAN     PATCH ANGIOPLASTY Right 08/06/2020   Procedure: PATCH ANGIOPLASTY;  Surgeon: [name], MD;  Location: [facility] OR;  Service: Vascular;  Laterality: Right;   PERIPHERAL VASCULAR BALLOON ANGIOPLASTY Right 09/09/2020   Procedure: PERIPHERAL VASCULAR BALLOON ANGIOPLASTY;  Surgeon: [name], MD;  Location: [place] [facility];  Service: Cardiovascular;  Laterality: Right;  popliteal   PERIPHERAL VASCULAR INTERVENTION Right 08/05/2020   Procedure: PERIPHERAL VASCULAR INTERVENTION;  Surgeon: [name], MD;  Location: [place] [facility];  Service: Cardiovascular;  Laterality: Right;  SFA   PERIPHERAL VASCULAR INTERVENTION Right 09/09/2020   Procedure: PERIPHERAL VASCULAR INTERVENTION;  Surgeon: [name], MD;  Location: [place] [facility];  Service: Cardiovascular;  Laterality: Right;  external iliac   TEE WITHOUT CARDIOVERSION N/A 09/18/2019   Procedure: TRANSESOPHAGEAL ECHOCARDIOGRAM (TEE);  Surgeon: Buford Dresser, MD;  Location: [facility] ENDOSCOPY;   Service: Cardiovascular;  Laterality: N/A;   WOUND DEBRIDEMENT Right 10/08/2020   Procedure: DEBRIDEMENT WOUND RIGHT FOOT;  Surgeon: [name], MD;  Location: [place];  Service: Vascular;  Laterality: Right;    Allergies: Patient has no known allergies.  Medications: Prior to Admission medications   Medication Sig Start Date End Date Taking? Authorizing Provider  allopurinol (ZYLOPRIM) 100 MG tablet Take 100 mg by mouth at bedtime. 06/09/14  Yes [provider]  apixaban (ELIQUIS) 5 MG TABS tablet Take 5 mg by mouth 2 (two) times daily.   Yes [provider]  aspirin 81 MG chewable tablet Chew 81 mg by mouth daily.  06/09/14  Yes [provider]  atorvastatin (LIPITOR) 40 MG tablet Take 40 mg by mouth at bedtime.   Yes [provider]  b complex-vitamin c-folic acid (NEPHRO-VITE) 0.8 MG TABS tablet Take 1 tablet by mouth daily. 05/08/20  Yes [provider]  feeding supplement (BOOST HIGH PROTEIN) LIQD Take 1 Container by mouth daily as needed (nutrition).   Yes [provider]  folic acid (FOLVITE) 1 MG tablet Take 1 tablet (1 mg total) by mouth daily. 05/11/21  Yes Garvin Fila, MD  insulin aspart (NOVOLOG) 100 UNIT/ML injection Insulin sliding scale: Blood sugar  120-150   3units                       151-200   4units                       201-250   7units                       251- 300  11units                       301-350   15uints                       351-400   20units                       >400         call MD immediately 11/06/20  Yes Florencia Reasons, MD  Insulin Pen Needle 32G X 4 MM MISC 1 each by Other route as needed (insulin).  09/10/16  Yes [provider]  levalbuterol (XOPENEX HFA) 45 MCG/ACT inhaler Inhale 2 puffs into the lungs every 8 (eight) hours as needed for wheezing. 09/21/19  Yes Sheikh, Omair Latif, DO  lidocaine-prilocaine (EMLA) cream Apply 1 application topically 3 (three) times a week. 30 minutes prior  to Dialysis - MWF 05/08/18  Yes [provider]  oxyCODONE-acetaminophen (PERCOCET/ROXICET) 5-325 MG tablet Take 1 tablet by mouth every 6 (six) hours as needed for moderate pain. 11/06/20  Yes Florencia Reasons, MD  predniSONE (DELTASONE) 5 MG tablet Take 5 mg by mouth daily with breakfast. 03/02/20  Yes [provider]  senna-docusate (SENOKOT-S) 8.6-50 MG tablet Take 1 tablet by mouth  2 (two) times daily. Hold if diarrhea Patient taking differently: Take 1 tablet by mouth 2 (two) times daily as needed for mild constipation. 11/06/20  Yes Florencia Reasons, MD  sevelamer carbonate (RENVELA) 800 MG tablet Take 800 mg by mouth 3 (three) times daily with meals. 03/31/20  Yes [provider]  donepezil (ARICEPT) 10 MG tablet Take 1 tablet (10 mg total) by mouth at bedtime. Start half a tablet daily for 4 weeks to be increased to 1 tablet daily if tolerated 05/11/21   Garvin Fila, MD     Family History  Problem Relation Age of Onset   Diabetes Mellitus II Mother    Cancer Mother        STOMACH   Diabetes Mellitus II Brother    Other Father        UNKOWN   Healthy Daughter    Diabetes Son     Social History   Socioeconomic History   Marital status: Married    Spouse name: [name]   Number of children: Not on file   Years of education: Not on file   Highest education level: Not on file  Occupational History   Not on file  Tobacco Use   Smoking status: Former   Smokeless tobacco: Never  Scientific laboratory technician Use: Never used  Substance and Sexual Activity   Alcohol use: Never   Drug use: Never   Sexual activity: Not on file  Other Topics Concern   Not on file  Social History Narrative   Lives with wife, son, daughter in Sports coach and grandson   Right Handed   Drinks 1 cup caffeine daily   Social Determinants of Health   Financial Resource Strain: Not on file  Food Insecurity: Not on file  Transportation Needs: Not on file  Physical Activity: Not on file  Stress: Not on  file  Social Connections: Not on file     Review of Systems: A 12 point ROS discussed and pertinent positives are indicated in the HPI above.  All other systems are negative.  Vital Signs: BP 134/68   Pulse 95   Temp 98.7 F (37.1 C) (Oral)   Resp 16   Ht 5\' 9"  (1.753 m)   Wt 136 lb 11 oz (62 kg)   SpO2 98%   BMI 20.18 kg/m   Physical Exam Vitals reviewed.  HENT:     Mouth/Throat:     Mouth: Mucous membranes are moist.  Cardiovascular:     Rate and Rhythm: Normal rate and regular rhythm.     Heart sounds: Normal heart sounds.  Pulmonary:     Effort: Pulmonary effort is normal.     Breath sounds: Normal breath sounds.  Abdominal:     Palpations: Abdomen is soft.  Musculoskeletal:        General: Swelling present.     Comments: RUE swelling RUE fistula: +pulse and + [place]  Neurological:     Mental Status: He is alert. Mental status is at baseline.  Psychiatric:     Comments: Wife at bedside Pt does have some disabilities with speech and understanding She consents to IR procedure    Imaging: DG Chest 1 View  Result Date: 06/03/2021 CLINICAL DATA:  Fall with hip fracture EXAM: CHEST  1 VIEW COMPARISON:  09/15/2019, 07/30/2019 FINDINGS: Chronic elevation of left diaphragm. Diffuse bilateral reticular interstitial opacity consistent with chronic lung disease. Mild cardiomegaly with aortic atherosclerosis. No pleural effusion, pneumothorax, or acute airspace disease. IMPRESSION:  Chronic interstitial lung disease.  No acute airspace disease. Electronically Signed   By: Donavan Foil M.D.   On: 06/03/2021 16:00   DG Pelvis Portable  Result Date: 06/04/2021 CLINICAL DATA:  Postop left hemiarthroplasty EXAM: PORTABLE PELVIS 1-2 VIEWS COMPARISON:  Left hip radiographs 06/03/2021 FINDINGS: There has been interval left hip arthroplasty for repair of a previously seen femoral neck fracture. Hardware alignment is within expected limits, without evidence of hardware related  complication. Right hip alignment is stable. The SI joints and symphysis pubis are intact. Extensive vascular calcifications and a right femoral stent are noted. IMPRESSION: Status post left hip arthroplasty without evidence of complication. Electronically Signed   By: Valetta Mole M.D.   On: 06/04/2021 15:50   ECHOCARDIOGRAM COMPLETE  Result Date: 06/04/2021    ECHOCARDIOGRAM REPORT   Patient Name:   Jonathan BOLEY Sr. Date of Exam: 06/04/2021 Medical Rec #:  [id number]          Height:       69.0 in Accession #:    [id number]         Weight:       160.0 lb Date of Birth:  [birth year]          BSA:          1.879 m Patient Age:    12 years           BP:           142/62 mmHg Patient Gender: M                  HR:           91 bpm. Exam Location:  Inpatient Procedure: 2D Echo, Cardiac Doppler, Color Doppler and Intracardiac            Opacification Agent Indications:     Apical Thrombus  History:         Patient has prior history of Echocardiogram examinations, most                  recent 08/01/2019. Risk Factors:Diabetes, Hypertension and                  Family History of Coronary Artery Disease.  Sonographer:     Helmut Muster Referring Phys:  [id number] [place] Diagnosing Phys: Mertie Moores MD IMPRESSIONS  1. With Definity contrast, an apical thrombus is visualized in the LV apex.         . Left ventricular ejection fraction, by estimation, is 30 to 35%. The left ventricle has moderately decreased function. The left ventricle demonstrates regional wall motion abnormalities (see scoring diagram/findings for description). The left ventricular internal cavity size was mildly to moderately dilated. Left ventricular diastolic parameters are consistent with Grade II diastolic dysfunction (pseudonormalization). There is severe akinesis of the left ventricular, mid-apical anteroseptal wall and apical segment.  2. Right ventricular systolic function is normal. The right ventricular size is normal.  3. Left  atrial size was moderately dilated.  4. Right atrial size was mildly dilated.  5. A small pericardial effusion is present.  6. The mitral valve is grossly normal. Severe mitral valve regurgitation.  7. The aortic valve is grossly normal. Aortic valve regurgitation is not visualized. FINDINGS  Left Ventricle: With Definity contrast, an apical thrombus is visualized in the LV apex. Left ventricular ejection fraction, by estimation, is 30 to 35%. The left ventricle has moderately decreased function. The left ventricle demonstrates regional wall motion  abnormalities. Severe akinesis of the left ventricular, mid-apical anteroseptal wall and apical segment. Definity contrast agent was given IV to delineate the left ventricular endocardial borders. The left ventricular internal cavity size was mildly to moderately dilated. There is no left ventricular hypertrophy. Left ventricular diastolic parameters are consistent with Grade II diastolic dysfunction (pseudonormalization). Right Ventricle: The right ventricular size is normal. Right vetricular wall thickness was not well visualized. Right ventricular systolic function is normal. Left Atrium: Left atrial size was moderately dilated. Right Atrium: Right atrial size was mildly dilated. Pericardium: A small pericardial effusion is present. Mitral Valve: The mitral valve is grossly normal. Severe mitral valve regurgitation. Tricuspid Valve: The tricuspid valve is grossly normal. Tricuspid valve regurgitation is not demonstrated. Aortic Valve: The aortic valve is grossly normal. Aortic valve regurgitation is not visualized. Pulmonic Valve: The pulmonic valve was not well visualized. Pulmonic valve regurgitation is not visualized. Aorta: The aortic root and ascending aorta are structurally normal, with no evidence of dilitation. IAS/Shunts: The atrial septum is grossly normal. Additional Comments: A device lead is visualized.  LEFT VENTRICLE PLAX 2D LVIDd:         5.00 cm       Diastology LVIDs:         4.20 cm      LV e' medial:    6.66 cm/s LV PW:         1.00 cm      LV E/e' medial:  19.1 LV IVS:        1.00 cm      LV e' lateral:   9.14 cm/s LVOT diam:     1.90 cm      LV E/e' lateral: 13.9 LV SV:         48 LV SV Index:   25 LVOT Area:     2.84 cm  LV Volumes (MOD) LV vol d, MOD A2C: 257.0 ml LV vol d, MOD A4C: 205.0 ml LV vol s, MOD A2C: 173.0 ml LV vol s, MOD A4C: 161.0 ml LV SV MOD A2C:     84.0 ml LV SV MOD A4C:     205.0 ml LV SV MOD BP:      66.5 ml RIGHT VENTRICLE             IVC RV S prime:     11.70 cm/s  IVC diam: 2.20 cm TAPSE (M-mode): 2.1 cm LEFT ATRIUM             Index        RIGHT ATRIUM           Index LA diam:        5.00 cm 2.66 cm/m   RA Area:     18.40 cm LA Vol (A2C):   72.0 ml 38.32 ml/m  RA Volume:   51.40 ml  27.35 ml/m LA Vol (A4C):   97.2 ml 51.73 ml/m LA Biplane Vol: 84.1 ml 44.76 ml/m  AORTIC VALVE LVOT Vmax:   104.00 cm/s LVOT Vmean:  68.500 cm/s LVOT VTI:    0.169 m  AORTA Ao Root diam: 2.90 cm Ao Asc diam:  3.30 cm MITRAL VALVE MV Area (PHT): 4.71 cm       SHUNTS MV Decel Time: 161 msec       Systemic VTI:  0.17 m MR Peak grad:    162.3 mmHg   Systemic Diam: 1.90 cm MR Mean grad:    97.0 mmHg MR Vmax:  637.00 cm/s MR Vmean:        460.0 cm/s MR PISA:         5.09 cm MR PISA Eff ROA: 25 mm MR PISA Radius:  0.90 cm MV E velocity: 127.00 cm/s MV A velocity: 105.00 cm/s MV E/A ratio:  1.21 Mertie Moores MD Electronically signed by Mertie Moores MD Signature Date/Time: 06/04/2021/10:06:57 AM    Final    DG Hip Unilat With Pelvis 2-3 Views Left  Result Date: 06/03/2021 CLINICAL DATA:  Fall EXAM: DG HIP (WITH OR WITHOUT PELVIS) 2-3V LEFT COMPARISON:  None. FINDINGS: Extensive vascular calcifications. Pubic symphysis and rami appear intact. Acute left femoral neck fracture with cranial displacement of trochanter. Femoral head projects in joint. IMPRESSION: Acute mildly displaced left femoral neck fracture Electronically Signed   By: Donavan Foil M.D.   On: 06/03/2021 15:58    Labs:  CBC: Recent Labs    06/04/21 0818 06/04/21 1412 06/05/21 0320 06/06/21 0259  WBC 6.9 6.7 7.8 7.9  HGB 10.2* 9.6* 8.8* 6.4*  HCT 31.6* 29.8* 27.2* 18.9*  PLT 164 157 152 144*    COAGS: Recent Labs    11/04/20 0401 11/05/20 0506 11/06/20 0435 06/04/21 0459 06/04/21 0818  INR 1.5* 1.6* 2.0* 1.3*  --   APTT  --   --   --   --  40*    BMP: Recent Labs    06/04/21 0459 06/04/21 0818 06/05/21 0320 06/06/21 0259  NA 137 135 135 132*  K 4.7 4.7 4.5 4.4  CL 95* 94* 95* 94*  CO2 30 25 28 28   GLUCOSE 101* 100* 113* 176*  BUN 62* 64* 38* 69*  CALCIUM 8.8* 8.7* 8.4* 8.3*  CREATININE 8.83* 9.07* 5.56* 7.37*  GFRNONAA 6* 6* 10* 7*    LIVER FUNCTION TESTS: Recent Labs    10/30/20 1001 11/01/20 0105 11/06/20 0750 06/05/21 0320 06/06/21 0259  BILITOT 0.6  --   --  0.6  --   AST 12*  --   --  18  --   ALT 10  --   --  12  --   ALKPHOS 49  --   --  50  --   PROT 6.6  --   --  5.5*  --   ALBUMIN 3.1* 2.8* 2.3* 2.8* 2.4*    TUMOR MARKERS: No results for input(s): AFPTM, CEA, CA199, CHROMGRNA in the last 8760 hours.  Assessment and Plan:  ESRD RUE dialysis fistula intact--- using for dialysis without issue Noted RUE swelling 2 weeks ago--- no resolution Nephrology asking for fistula evaluation and possible intervention Pt is scheduled for 10/31 in IR Pt and wife aware of procedure benefits and risks Including but not limited to: infection; bleeding; vessel damage and/or damage to surrounding structures Agreeable to proceed Consent signed and in IR   Thank you for this interesting consult.  I greatly enjoyed meeting SAMMUEL BLICK Sr. and look forward to participating in their care.  A copy of this report was sent to the requesting provider on this date.  Electronically Signed: Lavonia Drafts, PA-C 06/06/2021, 12:05 PM   I spent a total of 20 Minutes    in face to face in clinical consultation, greater than  50% of which was counseling/coordinating care for RUE fistula evaluation

## 2021-06-06 NOTE — Progress Notes (Addendum)
Subjective: No current complaints/ wife in room, per wife right upper arm AV fistula swelling for few weeks not improving or worsening no problems with cannulation in hospital.  Awaiting rehab admission  Objective Vital signs in last 24 hours: Vitals:   06/05/21 2108 06/06/21 0621 06/06/21 0658 06/06/21 0815  BP: (!) 119/53 (!) 124/57 (!) 127/54 (!) 129/58  Pulse: 85 85 83 90  Resp:    16  Temp:  98.7 F (37.1 C) 98.4 F (36.9 C) 98 F (36.7 C)  TempSrc:  Oral Oral Oral  SpO2: 100% 98% 97% 98%  Weight:      Height:       Weight change:   Physical Exam: General: Alert, soft spoken WDWN elderly male NAD Lungs: CTA nonlabored  Heart: RRR, no MRG  Abdomen: NABS soft, non-tender, non-distended  Lower extremities: Right BKA, no edema bilateral lower extremities Dialysis Access: RUE AVF + bruit, right upper arm with some edema, nontender   Dialysis Orders:  Center: [facility]  on MWF . 180NRe, 4 hour 43min, BFR 400, DFR Auto 1.5, EDW 61.7kg, 2K, 2Ca, AVF 15g Heparin 3000 unit bolus  Calcitriol 1.28mcg PO q HD   Problem/Plan  Left femoral neck fracture: Orthopedic surgery status post 10/28 surgery  per ortho  ESRD:  Dialysis on MWF schedule, Right upper arm/AV fistula swelling= evaluate with IR fistulogram, has not had problems with cannulation in hospital Hypertension/volume: No volume overload on exam, BP moderately elevated.  10/28 stable now after dialysis, no BP meds  Anemia: [name].m. Hgb 6.4, currently getting transfusion not on ESA outpatient, low-dose and also iron studies predialysis tomorrow  metabolic bone disease: Calcium controlled.  Phosphorus 5.9 continue calcitriol and binders.  Follow-up labs predialysis tomorrow  Nutrition: ALB 2.4 protein supplement to diet, renal/carb modified CAD/[name].fib/hx of LV thrombus: cardiology consulted  cleared for hip surgery . no chest pain or dyspnea, back on apixaban, he is stable /cardiology signed off 10/29  Ernest Haber, PA-C [facility] [phone] 06/06/2021,9:49 AM  LOS: 3 days   Labs: Basic Metabolic Panel: Recent Labs  Lab 06/04/21 0818 06/05/21 0320 06/06/21 0259  NA 135 135 132*  K 4.7 4.5 4.4  CL 94* 95* 94*  CO2 25 28 28   GLUCOSE 100* 113* 176*  BUN 64* 38* 69*  CREATININE 9.07* 5.56* 7.37*  CALCIUM 8.7* 8.4* 8.3*  PHOS  --   --  5.9*   Liver Function Tests: Recent Labs  Lab 06/05/21 0320 06/06/21 0259  AST 18  --   ALT 12  --   ALKPHOS 50  --   BILITOT 0.6  --   PROT 5.5*  --   ALBUMIN 2.8* 2.4*   No results for input(s): LIPASE, AMYLASE in the last 168 hours. No results for input(s): AMMONIA in the last 168 hours. CBC: Recent Labs  Lab 06/03/21 1527 06/03/21 1952 06/04/21 0818 06/04/21 1412 06/05/21 0320 06/06/21 0259  WBC 7.1  --  6.9 6.7 7.8 7.9  NEUTROABS 5.9  --   --   --  6.2 6.0  HGB 12.5*   < > 10.2* 9.6* 8.8* 6.4*  HCT 37.7*   < > 31.6* 29.8* 27.2* 18.9*  MCV 95.9  --  96.0 97.7 96.8 95.9  PLT 113*  --  164 157 152 144*   < > = values in this interval not displayed.   Cardiac Enzymes: No results for input(s): CKTOTAL, CKMB, CKMBINDEX, TROPONINI in the last 168 hours. CBG: Recent Labs  Lab 06/05/21 1230 06/05/21 1753 06/05/21 1948 06/06/21 0359 06/06/21 0846  GLUCAP 200* 185* 161* 162* 125*    Studies/Results: DG Pelvis Portable  Result Date: 06/04/2021 CLINICAL DATA:  Postop left hemiarthroplasty EXAM: PORTABLE PELVIS 1-2 VIEWS COMPARISON:  Left hip radiographs 06/03/2021 FINDINGS: There has been interval left hip arthroplasty for repair of [name] previously seen femoral neck fracture. Hardware alignment is within expected limits, without evidence of hardware related complication. Right hip alignment is stable. The SI joints and symphysis pubis are intact. Extensive vascular calcifications and [name] right femoral stent are noted. IMPRESSION: Status post left hip arthroplasty without evidence of complication. Electronically Signed   By: Valetta Mole  M.D.   On: 06/04/2021 15:50   Medications:   sodium chloride   Intravenous Once   allopurinol  100 mg Oral QHS   apixaban  2.5 mg Oral BID   [START ON 06/07/2021] apixaban  5 mg Oral BID   atorvastatin  40 mg Oral QHS   cefadroxil  500 mg Oral Once   [START ON 06/07/2021] cefadroxil  500 mg Oral Q36H   Chlorhexidine Gluconate Cloth  6 each Topical Q0600   feeding supplement  237 mL Oral TID BM   feeding supplement (NEPRO CARB STEADY)  237 mL Oral BID BM   folic acid  1 mg Oral Daily   insulin aspart  0-9 Units Subcutaneous Q4H   multivitamin  1 tablet Oral QHS   predniSONE  5 mg Oral Q breakfast   sevelamer carbonate  800 mg Oral TID WC    I have seen and examined this patient and agree with plan and assessment in the above note with renal recommendations/intervention highlighted.  IR consult for fistulogram.  Continue with HD on MWF schedule.   Awaiting CIR placement.  Jonathan [name] Jonathan Adamek,MD 06/06/2021 10:21 AM

## 2021-06-06 NOTE — Progress Notes (Signed)
PROGRESS NOTE   Jonathan [name]  WJX:[id number] DOB: Nov 28, 1950 DOA: 06/03/2021 PCP: Ginger Organ., MD  Brief Narrative:  70 year old male community dwelling c PVD with chronic nonhealing right foot ulcer + R CFA stenting 09/2020 -- right diabetic foot infection with osteomyelitis status post BKA 3/28/2022IDDM, ESRD MWF, HTN, prior LV mural thrombus, MI at age 30?  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 Zachery Dakins 10/28 Weightbearing as tolerated, prophylactic cefadroxil for 7 days Resume Eliquis lower dose 2.5 twice daily for first 1 to 2 days and then home dose to 5 mg twice daily Pain control hydrocodone 1 every 6 as needed, Dilaudid 0.5 every 4 as needed severe pain Wife hopeful for CIR-backup will need to skilled placement HFrEF EF 30 to 21%, grade 2 diastolic dysfunction with severe left ventricular akinesis MI at age 28 Prior apical thrombus and ventricle previously on Eliquis  Chronic anticoagulation as per above Not on beta-blocker or ACE inhibitor? 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  Paroxysmal A. Fib Not on rate control-keep on monitors perioperatively   DVT prophylaxis: Perioperative heparin Code Status: Full code Family Communication: Discussed with wife at the bedside Disposition:  Status is: Inpatient  Remains inpatient appropriate because:    await CIR decision versus backup of skilled placement    Consultants:  Orthopedics Cardiology  Procedures:   Antimicrobials:     Subjective:  More communicative No pain no fever No reports dark or tarry stool Pleasant no other c/o  Objective: Vitals:   06/05/21 2108 06/06/21 0621 06/06/21 0658 06/06/21 0815  BP: (!) 119/53 (!) 124/57 (!) 127/54 (!) 129/58  Pulse: 85 85 83 90  Resp:    16  Temp:  98.7 F (37.1 C) 98.4 F (36.9 C) 98 F (36.7 C)  TempSrc:  Oral Oral Oral  SpO2: 100% 98% 97% 98%  Weight:      Height:       No intake or output data in the 24 hours ending 06/06/21 0908  Filed Weights   06/03/21 2300 06/04/21 1600 06/04/21 2016  Weight: 72.6 kg 61.5 kg 62 kg    Examination:  Awake alert coherent no distress seems more engaging EOMI NCAT moderate dentition Chest clear no rales no rhonchi S1-S2 no murmur  no rub no gallop Abdomen soft no rebound no guarding nontender Dialysis access in right arm is stable ROM intact no focal deficit Neurologically intact moving 4 limbs equally without deficit Amputation stump on the right side-postop dressings on left side  Data  Reviewed: personally reviewed   CBC    Component Value Date/Time   WBC 7.9 06/06/2021 0259   RBC 1.97 (L) 06/06/2021 0259   HGB 6.4 (LL) 06/06/2021 0259   HCT 18.9 (L) 06/06/2021 0259   PLT 144 (L) 06/06/2021 0259   MCV 95.9 06/06/2021 0259   MCH 32.5 06/06/2021 0259   MCHC 33.9 06/06/2021 0259   RDW 17.5 (H) 06/06/2021 0259   LYMPHSABS 1.1 06/06/2021 0259   MONOABS 0.5 06/06/2021 0259   EOSABS 0.2 06/06/2021 0259   BASOSABS 0.0 06/06/2021 0259   CMP Latest Ref Rng & Units 06/06/2021 06/05/2021 06/04/2021  Glucose 70 - 99 mg/dL 176(H) 113(H) 100(H)  BUN 8 - 23 mg/dL 69(H) 38(H) 64(H)  Creatinine 0.61 - 1.24 mg/dL 7.37(H) 5.56(H) 9.07(H)  Sodium 135 - 145 mmol/L 132(L) 135 135  Potassium 3.5 - 5.1 mmol/L 4.4 4.5 4.7  Chloride 98 - 111 mmol/L 94(L) 95(L) 94(L)  CO2 22 - 32 mmol/L 28 28 25   Calcium 8.9 - 10.3 mg/dL 8.3(L) 8.4(L) 8.7(L)  Total Protein 6.5 - 8.1 g/dL - 5.5(L) -  Total Bilirubin 0.3 - 1.2 mg/dL - 0.6 -  Alkaline Phos 38 - 126 U/L - 50 -  AST 15 - 41 U/L - 18 -  ALT 0 - 44 U/L - 12 -     Radiology Studies: DG Pelvis Portable  Result Date: 06/04/2021 CLINICAL DATA:  Postop left hemiarthroplasty EXAM: PORTABLE PELVIS 1-2 VIEWS COMPARISON:  Left hip radiographs 06/03/2021 FINDINGS: There has been interval left hip arthroplasty for repair of a previously seen femoral neck fracture. Hardware alignment is within expected limits, without evidence of hardware related complication. Right hip alignment is stable. The SI joints and symphysis pubis are intact. Extensive vascular calcifications and a right femoral stent are noted. IMPRESSION: Status post left hip arthroplasty without evidence of complication. Electronically Signed   By: Valetta Mole M.D.   On: 06/04/2021 15:50   ECHOCARDIOGRAM COMPLETE  Result Date: 06/04/2021    ECHOCARDIOGRAM REPORT   Patient Name:   Jonathan DOWNIE Sr. Date of Exam: 06/04/2021 Medical Rec #:  [id number]          Height:       69.0 in  Accession #:    [id number]         Weight:       160.0 lb Date of Birth:  September 03, 1950          BSA:          1.879 m Patient Age:    70 years           BP:           142/62 mmHg Patient Gender: M                  HR:           91 bpm. Exam Location:  Inpatient Procedure: 2D Echo, Cardiac Doppler, Color Doppler and Intracardiac            Opacification Agent Indications:     Apical Thrombus  History:         Patient has prior history of Echocardiogram examinations, most  recent 08/01/2019. Risk Factors:Diabetes, Hypertension and                  Family History of Coronary Artery Disease.  Sonographer:     Helmut Muster Referring Phys:  [id number] [place] Diagnosing Phys: Mertie Moores MD IMPRESSIONS  1. With Definity contrast, an apical thrombus is visualized in the LV apex.         . Left ventricular ejection fraction, by estimation, is 30 to 35%. The left ventricle has moderately decreased function. The left ventricle demonstrates regional wall motion abnormalities (see scoring diagram/findings for description). The left ventricular internal cavity size was mildly to moderately dilated. Left ventricular diastolic parameters are consistent with Grade II diastolic dysfunction (pseudonormalization). There is severe akinesis of the left ventricular, mid-apical anteroseptal wall and apical segment.  2. Right ventricular systolic function is normal. The right ventricular size is normal.  3. Left atrial size was moderately dilated.  4. Right atrial size was mildly dilated.  5. A small pericardial effusion is present.  6. The mitral valve is grossly normal. Severe mitral valve regurgitation.  7. The aortic valve is grossly normal. Aortic valve regurgitation is not visualized. FINDINGS  Left Ventricle: With Definity contrast, an apical thrombus is visualized in the LV apex. Left ventricular ejection fraction, by estimation, is 30 to 35%. The left ventricle has moderately decreased function. The left  ventricle demonstrates regional wall motion abnormalities. Severe akinesis of the left ventricular, mid-apical anteroseptal wall and apical segment. Definity contrast agent was given IV to delineate the left ventricular endocardial borders. The left ventricular internal cavity size was mildly to moderately dilated. There is no left ventricular hypertrophy. Left ventricular diastolic parameters are consistent with Grade II diastolic dysfunction (pseudonormalization). Right Ventricle: The right ventricular size is normal. Right vetricular wall thickness was not well visualized. Right ventricular systolic function is normal. Left Atrium: Left atrial size was moderately dilated. Right Atrium: Right atrial size was mildly dilated. Pericardium: A small pericardial effusion is present. Mitral Valve: The mitral valve is grossly normal. Severe mitral valve regurgitation. Tricuspid Valve: The tricuspid valve is grossly normal. Tricuspid valve regurgitation is not demonstrated. Aortic Valve: The aortic valve is grossly normal. Aortic valve regurgitation is not visualized. Pulmonic Valve: The pulmonic valve was not well visualized. Pulmonic valve regurgitation is not visualized. Aorta: The aortic root and ascending aorta are structurally normal, with no evidence of dilitation. IAS/Shunts: The atrial septum is grossly normal. Additional Comments: A device lead is visualized.  LEFT VENTRICLE PLAX 2D LVIDd:         5.00 cm      Diastology LVIDs:         4.20 cm      LV e' medial:    6.66 cm/s LV PW:         1.00 cm      LV E/e' medial:  19.1 LV IVS:        1.00 cm      LV e' lateral:   9.14 cm/s LVOT diam:     1.90 cm      LV E/e' lateral: 13.9 LV SV:         48 LV SV Index:   25 LVOT Area:     2.84 cm  LV Volumes (MOD) LV vol d, MOD A2C: 257.0 ml LV vol d, MOD A4C: 205.0 ml LV vol s, MOD A2C: 173.0 ml LV vol s, MOD A4C: 161.0 ml LV SV MOD A2C:  84.0 ml LV SV MOD A4C:     205.0 ml LV SV MOD BP:      66.5 ml RIGHT VENTRICLE              IVC RV S prime:     11.70 cm/s  IVC diam: 2.20 cm TAPSE (M-mode): 2.1 cm LEFT ATRIUM             Index        RIGHT ATRIUM           Index LA diam:        5.00 cm 2.66 cm/m   RA Area:     18.40 cm LA Vol (A2C):   72.0 ml 38.32 ml/m  RA Volume:   51.40 ml  27.35 ml/m LA Vol (A4C):   97.2 ml 51.73 ml/m LA Biplane Vol: 84.1 ml 44.76 ml/m  AORTIC VALVE LVOT Vmax:   104.00 cm/s LVOT Vmean:  68.500 cm/s LVOT VTI:    0.169 m  AORTA Ao Root diam: 2.90 cm Ao Asc diam:  3.30 cm MITRAL VALVE MV Area (PHT): 4.71 cm       SHUNTS MV Decel Time: 161 msec       Systemic VTI:  0.17 m MR Peak grad:    162.3 mmHg   Systemic Diam: 1.90 cm MR Mean grad:    97.0 mmHg MR Vmax:         637.00 cm/s MR Vmean:        460.0 cm/s MR PISA:         5.09 cm MR PISA Eff ROA: 25 mm MR PISA Radius:  0.90 cm MV E velocity: 127.00 cm/s MV A velocity: 105.00 cm/s MV E/A ratio:  1.21 Mertie Moores MD Electronically signed by Mertie Moores MD Signature Date/Time: 06/04/2021/10:06:57 AM    Final      Scheduled Meds:  sodium chloride   Intravenous Once   allopurinol  100 mg Oral QHS   apixaban  2.5 mg Oral BID   [START ON 06/07/2021] apixaban  5 mg Oral BID   atorvastatin  40 mg Oral QHS   cefadroxil  500 mg Oral Once   [START ON 06/07/2021] cefadroxil  500 mg Oral Q36H   Chlorhexidine Gluconate Cloth  6 each Topical Q0600   feeding supplement  237 mL Oral TID BM   feeding supplement (NEPRO CARB STEADY)  237 mL Oral BID BM   folic acid  1 mg Oral Daily   insulin aspart  0-9 Units Subcutaneous Q4H   multivitamin  1 tablet Oral QHS   predniSONE  5 mg Oral Q breakfast   sevelamer carbonate  800 mg Oral TID WC   Continuous Infusions:     LOS: 3 days   Time spent: [place], MD [facility] To contact the attending provider between 7A-7P or the covering provider during after hours 7P-7A, please log into the web site [email or web address] and access using universal [place] password for that web site.  If you do not have the password, please call the hospital operator.  06/06/2021, 9:08 AM

## 2021-06-06 NOTE — Progress Notes (Signed)
     Subjective:  Patient reports pain as well controlled this morning.   His wife brought his prosthesis today so that he can continue to progress with physical therapy.  Hemoglobin this morning 6.4.  He was transfused 1 unit PRBCs.  Objective:   VITALS:   Vitals:   06/06/21 0621 06/06/21 0658 06/06/21 0815 06/06/21 0945  BP: (!) 124/57 (!) 127/54 (!) 129/58 134/68  Pulse: 85 83 90 95  Resp:   16 16  Temp: 98.7 F (37.1 C) 98.4 F (36.9 C) 98 F (36.7 C) 98.7 F (37.1 C)  TempSrc: Oral Oral Oral Oral  SpO2: 98% 97% 98% 98%  Weight:      Height:        Sensation intact distally Dorsiflexion/Plantar flexion intact Incision: dressing C/D/I, no strikethrough on Aquacel dressing Moderate expected postoperative swelling over the lateral hip Compartment soft    Lab Results  Component Value Date   WBC 7.9 06/06/2021   HGB 6.4 (LL) 06/06/2021   HCT 18.9 (L) 06/06/2021   MCV 95.9 06/06/2021   PLT 144 (L) 06/06/2021   BMET    Component Value Date/Time   NA 132 (L) 06/06/2021 0259   K 4.4 06/06/2021 0259   CL 94 (L) 06/06/2021 0259   CO2 28 06/06/2021 0259   GLUCOSE 176 (H) 06/06/2021 0259   BUN 69 (H) 06/06/2021 0259   CREATININE 7.37 (H) 06/06/2021 0259   CALCIUM 8.3 (L) 06/06/2021 0259   GFRNONAA 7 (L) 06/06/2021 0259     Assessment/Plan: 2 Days Post-Op   Principal Problem:   Hip fracture ([name]) Active Problems:   History of coronary artery disease   ESRD (end stage renal disease) on dialysis ([name])   Hyperlipidemia   CHF (congestive heart failure) ([name])  Left hip hemiarthroplasty for femoral neck fracture  Post op recs: WB: WBAT with posterior hip precautions x6 weeks Abx: ancef x23 hours post op, followed by cefadroxil 500 twice daily for 7 days given increased risk for postop infection given the patient's medical comorbidities Dressing: Aquacel dressing to be kept intact until follow-up Imaging: PACU xrays DVT prophylaxis: Eliquis 2.5 twice daily for  postop day 1-2 followed by resuming full dose anticoagulation Eliquis 5 twice daily starting postop day 3 Follow up: 2 weeks after surgery for a wound check    Jonathan [name] 06/06/2021, 11:33 AM   Charlies Constable, MD Cell [phone]  Contact information:   [email or web address] 7am-5pm epic message Dr. Zachery Dakins, or call office for patient follow up: [phone] After hours and holidays please check [email or web address] for group call information for [facility]

## 2021-06-06 NOTE — Progress Notes (Signed)
Received a page from bedside RN regarding hemoglobin drop this morning 6.4.  Obtained consent from patient's wife via phone for blood transfusion 1 pack red blood cells.  CBC will be ordered by bedside RN as per protocol.

## 2021-06-07 ENCOUNTER — Inpatient Hospital Stay (HOSPITAL_COMMUNITY): Payer: Medicare Other

## 2021-06-07 DIAGNOSIS — S72002A Fracture of unspecified part of neck of left femur, initial encounter for closed fracture: Secondary | ICD-10-CM | POA: Diagnosis not present

## 2021-06-07 HISTORY — PX: IR THROMBECTOMY AV FISTULA W/THROMBOLYSIS/PTA/STENT INC/SHUNT/IMG RT: IMG6120

## 2021-06-07 HISTORY — PX: IR DIALY SHUNT INTRO NEEDLE/INTRACATH INITIAL W/IMG RIGHT: IMG6115

## 2021-06-07 LAB — RENAL FUNCTION PANEL
Albumin: 2.4 g/dL — ABNORMAL LOW (ref 3.5–5.0)
Anion gap: 13 (ref 5–15)
BUN: 92 mg/dL — ABNORMAL HIGH (ref 8–23)
CO2: 27 mmol/L (ref 22–32)
Calcium: 8.4 mg/dL — ABNORMAL LOW (ref 8.9–10.3)
Chloride: 94 mmol/L — ABNORMAL LOW (ref 98–111)
Creatinine, Ser: 9.42 mg/dL — ABNORMAL HIGH (ref 0.61–1.24)
GFR, Estimated: 6 mL/min — ABNORMAL LOW (ref >60)
Glucose, Bld: 98 mg/dL (ref 70–99)
Phosphorus: 5.3 mg/dL — ABNORMAL HIGH (ref 2.5–4.6)
Potassium: 4.8 mmol/L (ref 3.5–5.1)
Sodium: 134 mmol/L — ABNORMAL LOW (ref 135–145)

## 2021-06-07 LAB — HEPATITIS B SURFACE ANTIBODY,QUALITATIVE
Hep B S Ab: NONREACTIVE
Hep B S Ab: NONREACTIVE

## 2021-06-07 LAB — GLUCOSE, CAPILLARY
Glucose-Capillary: 106 mg/dL — ABNORMAL HIGH (ref 70–99)
Glucose-Capillary: 108 mg/dL — ABNORMAL HIGH (ref 70–99)
Glucose-Capillary: 159 mg/dL — ABNORMAL HIGH (ref 70–99)
Glucose-Capillary: 65 mg/dL — ABNORMAL LOW (ref 70–99)
Glucose-Capillary: 85 mg/dL (ref 70–99)
Glucose-Capillary: 88 mg/dL (ref 70–99)
Glucose-Capillary: 97 mg/dL (ref 70–99)
Glucose-Capillary: 99 mg/dL (ref 70–99)

## 2021-06-07 LAB — CBC WITH DIFFERENTIAL/PLATELET
Abs Immature Granulocytes: 0.04 10*3/uL (ref 0.00–0.07)
Basophils Absolute: 0 10*3/uL (ref 0.0–0.1)
Basophils Relative: 0 %
Eosinophils Absolute: 0.1 10*3/uL (ref 0.0–0.5)
Eosinophils Relative: 2 %
HCT: 21.9 % — ABNORMAL LOW (ref 39.0–52.0)
Hemoglobin: 7.2 g/dL — ABNORMAL LOW (ref 13.0–17.0)
Immature Granulocytes: 1 %
Lymphocytes Relative: 13 %
Lymphs Abs: 1.1 10*3/uL (ref 0.7–4.0)
MCH: 30.9 pg (ref 26.0–34.0)
MCHC: 32.9 g/dL (ref 30.0–36.0)
MCV: 94 fL (ref 80.0–100.0)
Monocytes Absolute: 0.5 10*3/uL (ref 0.1–1.0)
Monocytes Relative: 7 %
Neutro Abs: 6.3 10*3/uL (ref 1.7–7.7)
Neutrophils Relative %: 77 %
Platelets: 142 10*3/uL — ABNORMAL LOW (ref 150–400)
RBC: 2.33 MIL/uL — ABNORMAL LOW (ref 4.22–5.81)
RDW: 17.5 % — ABNORMAL HIGH (ref 11.5–15.5)
WBC: 8.1 10*3/uL (ref 4.0–10.5)
nRBC: 0 % (ref 0.0–0.2)

## 2021-06-07 LAB — HEPATITIS B SURFACE ANTIGEN
Hepatitis B Surface Ag: NONREACTIVE
Hepatitis B Surface Ag: NONREACTIVE

## 2021-06-07 LAB — TYPE AND SCREEN
ABO/RH(D): O POS
Antibody Screen: NEGATIVE
Unit division: 0

## 2021-06-07 LAB — PREPARE RBC (CROSSMATCH)

## 2021-06-07 LAB — HEPATITIS B CORE ANTIBODY, TOTAL
Hep B Core Total Ab: NONREACTIVE
Hep B Core Total Ab: NONREACTIVE

## 2021-06-07 LAB — CBC
HCT: 21.5 % — ABNORMAL LOW (ref 39.0–52.0)
Hemoglobin: 7.1 g/dL — ABNORMAL LOW (ref 13.0–17.0)
MCH: 31.1 pg (ref 26.0–34.0)
MCHC: 33 g/dL (ref 30.0–36.0)
MCV: 94.3 fL (ref 80.0–100.0)
Platelets: 154 10*3/uL (ref 150–400)
RBC: 2.28 MIL/uL — ABNORMAL LOW (ref 4.22–5.81)
RDW: 17.5 % — ABNORMAL HIGH (ref 11.5–15.5)
WBC: 7.7 10*3/uL (ref 4.0–10.5)
nRBC: 0 % (ref 0.0–0.2)

## 2021-06-07 LAB — BPAM RBC
Blood Product Expiration Date: 202211272359
ISSUE DATE / TIME: 202210300631
Unit Type and Rh: 5100

## 2021-06-07 MED ORDER — CEFADROXIL 500 MG PO CAPS
500.0000 mg | ORAL_CAPSULE | ORAL | Status: DC
  Administered 2021-06-07 – 2021-06-10 (×2): 500 mg via ORAL
  Filled 2021-06-07 (×3): qty 1

## 2021-06-07 MED ORDER — SODIUM CHLORIDE 0.9% IV SOLUTION: Freq: Once | INTRAVENOUS | Status: DC

## 2021-06-07 MED ORDER — IOHEXOL 300 MG/ML  SOLN
100.0000 mL | Freq: Once | INTRAMUSCULAR | Status: AC | PRN
  Administered 2021-06-07: 30 mL via INTRA_ARTERIAL

## 2021-06-07 MED ORDER — APIXABAN 5 MG PO TABS
5.0000 mg | ORAL_TABLET | Freq: Two times a day (BID) | ORAL | Status: DC
  Administered 2021-06-08 – 2021-06-10 (×4): 5 mg via ORAL
  Filled 2021-06-07 (×4): qty 1

## 2021-06-07 MED ORDER — LIDOCAINE HCL (PF) 1 % IJ SOLN
INTRAMUSCULAR | Status: AC
  Filled 2021-06-07: qty 10

## 2021-06-07 NOTE — Plan of Care (Signed)

## 2021-06-07 NOTE — Progress Notes (Signed)
Occupational Therapy Treatment Patient Details Name: Jonathan [name] Sr. MRN: [id number] DOB: Aug 24, 1950 Today's Date: 06/07/2021   History of present illness 70 y.o. male with past medical history of chronic systolic congestive heart failure, coronary artery disease, peripheral vascular disease, diabetes mellitus, end-stage renal disease, hypertension, hyperlipidemia, history of LV mural thrombus, prior CVA, paroxysmal H fibrillation, dementia admitted with hip fracture.  S/p left hip hemiarthroplast 06/04/21.   OT comments  Pt is slowly progressing towards OT goals. Pt remains limited by pain, activity tolerance, balance, and cognition. During session, pt performing bed mobility and functional transfers with Mod - Max A +2, toilet hygiene with Total A, and LB dressing with Max A (see details below). Pt had RLE prosthetic during session and appeared to exhibit improved standing balance using device. Pt remains appropriate for CIR for further rehab prior to return home. Will continue to follow acutely.    Recommendations for follow up therapy are one component of a multi-disciplinary discharge planning process, led by the attending physician.  Recommendations may be updated based on patient status, additional functional criteria and insurance authorization.    Follow Up Recommendations  Acute inpatient rehab (3hours/day)    Assistance Recommended at Discharge Intermittent Supervision/Assistance  Equipment Recommendations  Other (comment) (TBD)    Recommendations for Other Services Rehab consult    Precautions / Restrictions Precautions Precautions: Posterior Hip Restrictions Weight Bearing Restrictions: Yes LLE Weight Bearing: Weight bearing as tolerated Other Position/Activity Restrictions: Posterior hip precautions       Mobility Bed Mobility Overal bed mobility: Needs Assistance Bed Mobility: Supine to Sit     Supine to sit: Mod assist;+2 for physical assistance           Transfers Overall transfer level: Needs assistance Equipment used: Rolling walker (2 wheels) Transfers: Sit to/from Stand Sit to Stand: Mod assist;Max assist;+2 physical assistance;+2 safety/equipment           General transfer comment: Pt requiring Max +2 to stand from EOB to [place]. Requiring Mod A +2 to stand to stedy with bed elevated. Transfer via Lift Equipment: Stedy   Balance Overall balance assessment: Needs assistance Sitting-balance support: Bilateral upper extremity supported;No upper extremity supported;Feet supported Sitting balance-Leahy Scale: Fair     Standing balance support: Bilateral upper extremity supported Standing balance-Leahy Scale: Poor                             ADL either performed or assessed with clinical judgement   ADL Overall ADL's : Needs assistance/impaired                     Lower Body Dressing: Maximal assistance;Sitting/lateral leans Lower Body Dressing Details (indicate cue type and reason): Able to don/doff sock on residual limb with extedned time and cues for initiation and efficiency. Likely still requiring Max A overall with LB dressing. Toilet Transfer: Moderate assistance;+2 for physical assistance;+2 for safety/equipment;BSC;Cueing for safety;Cueing for sequencing (STEDY) Toilet Transfer Details (indicate cue type and reason): Simulated to recliner Toileting- Clothing Manipulation and Hygiene: Total assistance;Sit to/from stand;+2 for physical assistance;+2 for safety/equipment;Cueing for safety;Cueing for sequencing         General ADL Comments: Able to use stedy with Mod +2 to complete simulated toilet transfer. Remians limited by pain, balance, activity toelrance, and cognition.     Vision       Perception     Praxis      Cognition Arousal/Alertness: Awake/alert  Behavior During Therapy: Flat affect Overall Cognitive Status: History of cognitive impairments - at baseline                                             Exercises     Shoulder Instructions       General Comments      Pertinent Vitals/ Pain       Pain Assessment: Faces Faces Pain Scale: Hurts little more Pain Location: Lt hip Pain Descriptors / Indicators: Operative site guarding Pain Intervention(s): Limited activity within patient's tolerance;Monitored during session;Repositioned  Home Living                                          Prior Functioning/Environment              Frequency  Min 2X/week        Progress Toward Goals  OT Goals(current goals can now be found in the care plan section)  Progress towards OT goals: Progressing toward goals  Acute Rehab OT Goals Patient Stated Goal: rehab prior to return home OT Goal Formulation: With family Time For Goal Achievement: 06/19/21 Potential to Achieve Goals: Good ADL Goals Pt Will Perform Lower Body Bathing: with mod assist;sit to/from stand Pt Will Perform Lower Body Dressing: with mod assist;sit to/from stand Pt Will Transfer to Toilet: with min assist;stand pivot transfer;bedside commode Pt Will Perform Toileting - Clothing Manipulation and hygiene: with min assist;sit to/from stand Additional ADL Goal #1: Pt will complete bed mobility at mod A level to prepare for EOB/OOB ADLs.  Plan Discharge plan remains appropriate;Frequency remains appropriate    Co-evaluation    PT/OT/SLP Co-Evaluation/Treatment: Yes Reason for Co-Treatment: For patient/therapist safety;To address functional/ADL transfers   OT goals addressed during session: ADL's and self-care      AM-PAC OT "6 Clicks" Daily Activity     Outcome Measure   Help from another person eating meals?: A Little Help from another person taking care of personal grooming?: A Little Help from another person toileting, which includes using toliet, bedpan, or urinal?: Total Help from another person bathing (including washing, rinsing, drying)?: A  Lot Help from another person to put on and taking off regular upper body clothing?: A Little Help from another person to put on and taking off regular lower body clothing?: A Lot 6 Click Score: 14    End of Session Equipment Utilized During Treatment: Gait belt;Rolling walker (2 wheels)  OT Visit Diagnosis: Unsteadiness on feet (R26.81);Muscle weakness (generalized) (M62.81);Pain;Other abnormalities of gait and mobility (R26.89);History of falling (Z91.81);Other symptoms and signs involving cognitive function Pain - Right/Left: Left Pain - part of body: Hip   Activity Tolerance Patient limited by pain   Patient Left in chair;with call bell/phone within reach;with chair alarm set;with family/visitor present   Nurse Communication Mobility status        Time: 0300-9233 OT Time Calculation (min): 32 min  Charges: OT General Charges $OT Visit: 1 Visit OT Treatments $Self Care/Home Management : 8-22 mins  Jolanda Mccann C, OT/L  Acute Rehab [place] 06/07/2021, 10:28 AM

## 2021-06-07 NOTE — Progress Notes (Signed)
Physical Therapy Treatment Patient Details Name: Jonathan POULIOT Sr. MRN: [id number] DOB: 11-Jan-1951 Today's Date: 06/07/2021   History of Present Illness 70 y.o. male with past medical history of chronic systolic congestive heart failure, coronary artery disease, peripheral vascular disease, diabetes mellitus, end-stage renal disease, hypertension, hyperlipidemia, history of LV mural thrombus, prior CVA, paroxysmal H fibrillation, dementia admitted with hip fracture.  S/p left hip hemiarthroplast 06/04/21.    PT Comments    Patient progressing slowly towards PT goals. Requires assist of 2 for bed mobility and tolerated standing multiple times from different surfaces with Mod-Max A of 2. Needs support for trunk when attempting to donn prosthesis with cues for technique. Utilized stedy to transfer pt to chair. Able to get more upright in standing in stedy then without. Reviewed HEP. Wife present and very supportive. Continues to be appropriate for intensive rehab. Will follow.    Recommendations for follow up therapy are one component of a multi-disciplinary discharge planning process, led by the attending physician.  Recommendations may be updated based on patient status, additional functional criteria and insurance authorization.  Follow Up Recommendations  Acute inpatient rehab (3hours/day)     Assistance Recommended at Discharge Frequent or constant Supervision/Assistance  Equipment Recommendations  Other (comment) (defer to next venue)    Recommendations for Other Services       Precautions / Restrictions Precautions Precautions: Posterior Hip Precaution Booklet Issued: Yes (comment) Precaution Comments: Reviewed with pt and wife Required Braces or Orthoses: Other Brace Other Brace: right prosthesis Restrictions Weight Bearing Restrictions: Yes LLE Weight Bearing: Weight bearing as tolerated Other Position/Activity Restrictions: Posterior hip precautions     Mobility  Bed  Mobility Overal bed mobility: Needs Assistance Bed Mobility: Supine to Sit     Supine to sit: Mod assist;+2 for physical assistance     General bed mobility comments: ASsist with LLE and trunk to get to EOB with cues for technique and sequencing, posterior bias. Assist with scooting bottom to EOB.    Transfers Overall transfer level: Needs assistance Equipment used: Rolling walker (2 wheels);Ambulation equipment used Transfers: Sit to/from [facility] Sit to Stand: Mod assist;Max assist;+2 physical assistance;+2 safety/equipment Stand pivot transfers: Total assist         General transfer comment: Assist of 2 to power to standing from elevated EOB with cues for hand placement, anterior translation, and cues for placement of RLE under BoS, stood from EOB x2 once with RW and once with stedy and from stedy x1. Transferred to chair via stedy. Transfer via Lift Equipment: Stedy  Ambulation/Gait             General Gait Details: UNable   Marine scientist Rankin (Stroke Patients Only)       Balance Overall balance assessment: Needs assistance Sitting-balance support: Feet supported;Single extremity supported Sitting balance-Leahy Scale: Fair Sitting balance - Comments: Requires Min A to maintain sitting balance when attempting to donn prosthesis, not able to remove BUE suipport.   Standing balance support: During functional activity Standing balance-Leahy Scale: Poor Standing balance comment: max assist to remain standing; stood for a few mins for pericare with cues for knee extension on right and upright posture, fatigues.                            Cognition Arousal/Alertness: Awake/alert Behavior During Therapy: Flat affect Overall  Cognitive Status: History of cognitive impairments - at baseline                                 General Comments: Minimal verbalizations which is  baseline per wife, answers questions appropriately. Needs step by step cues to perform tasks.        Exercises General Exercises - Lower Extremity Ankle Circles/Pumps: AROM;Left;Supine;10 reps Quad Sets: AAROM;Left;10 reps;Seated    General Comments General comments (skin integrity, edema, etc.): Wife present during session.      Pertinent Vitals/Pain Pain Assessment: Faces Faces Pain Scale: Hurts even more Pain Location: Lt hip Pain Descriptors / Indicators: Operative site guarding;Grimacing;Guarding Pain Intervention(s): Monitored during session;Repositioned;Premedicated before session;Limited activity within patient's tolerance    Home Living                          Prior Function            PT Goals (current goals can now be found in the care plan section) Progress towards PT goals: Progressing toward goals    Frequency    Min 3X/week      PT Plan Frequency needs to be updated    Co-evaluation PT/OT/SLP Co-Evaluation/Treatment: Yes Reason for Co-Treatment: For patient/therapist safety;To address functional/ADL transfers PT goals addressed during session: Mobility/safety with mobility;Balance;Strengthening/ROM;Proper use of DME OT goals addressed during session: ADL's and self-care      AM-PAC PT "6 Clicks" Mobility   Outcome Measure  Help needed turning from your back to your side while in a flat bed without using bedrails?: A Lot Help needed moving from lying on your back to sitting on the side of a flat bed without using bedrails?: A Lot Help needed moving to and from a bed to a chair (including a wheelchair)?: Total Help needed standing up from a chair using your arms (e.g., wheelchair or bedside chair)?: A Lot Help needed to walk in hospital room?: Total Help needed climbing 3-5 steps with a railing? : Total 6 Click Score: 9    End of Session Equipment Utilized During Treatment: Gait belt Activity Tolerance: Patient tolerated treatment  well;Patient limited by pain Patient left: in chair;with call bell/phone within reach;with chair alarm set;with family/visitor present Nurse Communication: Mobility status;Need for lift equipment (stedy) PT Visit Diagnosis: Unsteadiness on feet (R26.81);Muscle weakness (generalized) (M62.81);History of falling (Z91.81);Difficulty in walking, not elsewhere classified (R26.2);Pain Pain - Right/Left: Left Pain - part of body: Hip     Time: 3875-6433 PT Time Calculation (min) (ACUTE ONLY): 30 min  Charges:  $Therapeutic Activity: 8-22 mins                     Marisa Severin, PT, DPT Acute Rehabilitation Services Pager [phone] Office [phone]      Jonathan [name] 06/07/2021, 12:51 PM

## 2021-06-07 NOTE — Progress Notes (Signed)
[facility] Progress Note   Subjective: Seen in room, up in chair. Only speaks when spoken to, answers questions in monosyllables. NPO for IR.   Objective Vitals:   06/06/21 0815 06/06/21 0945 06/06/21 1534 06/07/21 0755  BP: (!) 129/58 134/68 (!) 133/58 123/63  Pulse: 90 95 88 87  Resp: 16 16 17 18   Temp: 98 F (36.7 C) 98.7 F (37.1 C) 98.1 F (36.7 C) 98 F (36.7 C)  TempSrc: Oral Oral Oral   SpO2: 98% 98% 100% 98%  Weight:      Height:       Physical Exam General: Older male with flat affect, NAD Heart:S1,S2 no M/R/G Lungs: CTAB A/P  No WOB Abdomen: NABS, NT Extremities: R BKA no stump edema. LLE without edema.  Dialysis Access: R AVF +T/B mild edema.    Additional Objective Labs: Basic Metabolic Panel: Recent Labs  Lab 06/04/21 0818 06/05/21 0320 06/06/21 0259  NA 135 135 132*  K 4.7 4.5 4.4  CL 94* 95* 94*  CO2 25 28 28   GLUCOSE 100* 113* 176*  BUN 64* 38* 69*  CREATININE 9.07* 5.56* 7.37*  CALCIUM 8.7* 8.4* 8.3*  PHOS  --   --  5.9*   Liver Function Tests: Recent Labs  Lab 06/05/21 0320 06/06/21 0259  AST 18  --   ALT 12  --   ALKPHOS 50  --   BILITOT 0.6  --   PROT 5.5*  --   ALBUMIN 2.8* 2.4*   No results for input(s): LIPASE, AMYLASE in the last 168 hours. CBC: Recent Labs  Lab 06/04/21 0818 06/04/21 1412 06/05/21 0320 06/06/21 0259 06/06/21 1426 06/07/21 0216  WBC 6.9 6.7 7.8 7.9  --  8.1  NEUTROABS  --   --  6.2 6.0  --  6.3  HGB 10.2* 9.6* 8.8* 6.4* 7.5* 7.2*  HCT 31.6* 29.8* 27.2* 18.9* 22.5* 21.9*  MCV 96.0 97.7 96.8 95.9  --  94.0  PLT 164 157 152 144*  --  142*   Blood Culture    Component Value Date/Time   SDES BLOOD SITE NOT SPECIFIED 10/30/2020 1307   SPECREQUEST  10/30/2020 1307    BOTTLES DRAWN AEROBIC ONLY Blood Culture results may not be optimal due to an inadequate volume of blood received in culture bottles   CULT  10/30/2020 1307    NO GROWTH 5 DAYS Performed at [facility],  [place] [address] [place], [place] [address]    REPTSTATUS 11/04/2020 FINAL 10/30/2020 1307    Cardiac Enzymes: No results for input(s): CKTOTAL, CKMB, CKMBINDEX, TROPONINI in the last 168 hours. CBG: Recent Labs  Lab 06/06/21 2105 06/07/21 0035 06/07/21 0504 06/07/21 0643 06/07/21 0804  GLUCAP 209* 159* 108* 99 97   Iron Studies: No results for input(s): IRON, TIBC, TRANSFERRIN, FERRITIN in the last 72 hours. @lablastinr3 @ Studies/Results: No results found. Medications:   sodium chloride   Intravenous Once   sodium chloride   Intravenous Once   allopurinol  100 mg Oral QHS   apixaban  5 mg Oral BID   atorvastatin  40 mg Oral QHS   cefadroxil  500 mg Oral Once   cefadroxil  500 mg Oral Q36H   Chlorhexidine Gluconate Cloth  6 each Topical Q0600   feeding supplement  237 mL Oral TID BM   feeding supplement (NEPRO CARB STEADY)  237 mL Oral BID BM   folic acid  1 mg Oral Daily   insulin aspart  0-9 Units  Subcutaneous Q4H   multivitamin  1 tablet Oral QHS   predniSONE  5 mg Oral Q breakfast   sevelamer carbonate  800 mg Oral TID WC     Dialysis Orders:  Center: [facility]  on MWF . 180NRe, 4 hour 88min, BFR 400, DFR Auto 1.5, EDW 61.7kg, 2K, 2Ca, AVF 15g Heparin 3000 unit bolus  Calcitriol 1.58mcg PO q HD   Assessment/Plan  Left femoral neck fracture: Orthopedic surgery status post 10/28 surgery  per ortho  ESRD:  Dialysis on MWF schedule next HD 06/07/21 Right upper arm/AV fistula swelling: IR consulted to evaluate.  Going to IR later this AM.  Hypertension/volume: No evidence of volume excess by exam. BP controlled. UF as tolerated.   Anemia: HGB down to 6.4 06/06/21 S/P 1 unit PRBCs. HGB 7.2. Transfuse 1 unit of PRBCs with HD today.  metabolic bone disease: Calcium controlled.  Phosphorus 5.9 continue calcitriol and binders.   Nutrition: ALB 2.4 protein supplement to diet, renal/carb modified CAD/a.fib/hx of LV thrombus: cardiology consulted  cleared for hip surgery . 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. Shaarav Ripple NP-C 06/07/2021, 10:17 AM  [employer] [phone]

## 2021-06-07 NOTE — Progress Notes (Signed)
Inpatient Rehab Admissions Coordinator:   I opened a case with pt.'s insurance this AM and await decision for CIR. Pt.'s wife updated.   Clemens Catholic, [place], [place] Admissions Coordinator  [phone] ([place]) [phone] (office)

## 2021-06-07 NOTE — Care Management Important Message (Signed)
Important Message  Patient Details  Name: Jonathan [name] Sr. MRN: [id number] Date of Birth: 1951/05/23   Medicare Important Message Given:  Yes     Markeda Narvaez 06/07/2021, 2:22 PM

## 2021-06-07 NOTE — Progress Notes (Signed)
PROGRESS NOTE   Jonathan [name]  [id number]:[id number] DOB: 1951/07/07 DOA: 06/03/2021 PCP: Ginger Organ., MD  Brief Narrative:  70 year old male community dwelling c PVD with chronic nonhealing right foot ulcer + R CFA stenting 09/2020 -- right diabetic foot infection with osteomyelitis status post BKA 3/28/2022IDDM, ESRD MWF, HTN, prior LV mural thrombus, MI at age 27?  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 Zachery Dakins 10/28 Weightbearing as tolerated, prophylactic cefadroxil for 7 days Resume Eliquis lower dose 2.5 twice daily for first 1 to 2 days and then home dose to 5 mg twice daily Pain control hydrocodone 1 every 6 as needed, IV pain meds have been discontinued Wife hopeful for CIR-backup will need to skilled placement HFrEF EF 30 to 44%, grade 2 diastolic dysfunction with severe left ventricular akinesis MI at age 89 Prior apical thrombus and ventricle previously on Eliquis  Chronic anticoagulation as per above Not on beta-blocker or ACE inhibitor? Continue Lipitor 40 daily Echo unchanged from prior--no fruther work-up/intervention IP Expected blood loss anemia from surgery 1 unit of blood given for hemoglobin 6.4 on 10/30, transfuse again 1 more unit 10/31 Will continue anticoagulation as this is a necessity given his underlying other issues including atrial thrombus Watch  for dark or tarry stool--await Hemoccult Repeat labs a.m. 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 HD as per renal Has significant swelling of right upper extremity dialysis fistula with dialysis--going for intervention and fistulogram possibly on 10/31 Continue 5 mg prednisone  Continue Renvela 800 3 times daily Prior pontine stroke 09/2019  multi-infarct dementia On aspirin 81 and Lipitor-continue both--no plavix as per family Continue Aricept and follow with neurology Continue folic acid for elevated homocystine PAD with nonhealing right foot and prior CFA stenting on right side No current Plavix per family IDDM A1c 5.7 Probably discontinue prednisone as OP per renal sitting in chair Pain seems to be controlled Continue SSI every 4 hours ?  Paroxysmal A. Fib Not on rate control-keep on monitors perioperatively   DVT prophylaxis: Perioperative heparin Code Status: Full code Family Communication: Discussed with wife at the bedside Disposition:  Status is: Inpatient  Remains inpatient appropriate because:    await CIR decision versus backup of skilled placement    Consultants:  Orthopedics Cardiology  Procedures:   Antimicrobials:     Subjective:  Sitting in chair Pain seems to be controlled No distress ROM intact  Objective: Vitals:   06/06/21 0815 06/06/21 0945 06/06/21 1534 06/07/21 0755  BP: (!) 129/58 134/68 (!) 133/58 123/63  Pulse: 90 95 88 87  Resp: 16 16 17 18   Temp: 98 F (36.7 C) 98.7 F (37.1 C) 98.1 F (36.7 C) 98 F (36.7 C)  TempSrc: Oral Oral Oral   SpO2: 98% 98% 100% 98%  Weight:      Height:        Intake/Output Summary (Last 24 hours) at 06/07/2021 1027 Last data filed at 06/06/2021 1655 Gross per 24 hour  Intake 600 ml  Output --  Net 600 ml    Filed Weights   06/03/21 2300 06/04/21 1600 06/04/21 2016  Weight: 72.6 kg 61.5 kg 62 kg    Examination:  Pleasant awake somewhat coherent no distress EOMI NCAT no focal  deficit Chest is clear no rales rhonchi Abdomen is soft Right-sided BKA is intact left side is able to raise leg wound seems to have no blood on the dressings  Data Reviewed: personally reviewed   CBC    Component Value Date/Time   WBC 8.1 06/07/2021 0216   RBC 2.33 (L) 06/07/2021 0216   HGB 7.2 (L) 06/07/2021 0216   HCT 21.9 (L) 06/07/2021 0216   PLT 142 (L) 06/07/2021 0216   MCV 94.0 06/07/2021 0216   MCH 30.9 06/07/2021 0216   MCHC 32.9 06/07/2021 0216   RDW 17.5 (H) 06/07/2021 0216   LYMPHSABS 1.1 06/07/2021 0216   MONOABS 0.5 06/07/2021 0216   EOSABS 0.1 06/07/2021 0216   BASOSABS 0.0 06/07/2021 0216   CMP Latest Ref Rng & Units 06/06/2021 06/05/2021 06/04/2021  Glucose 70 - 99 mg/dL 176(H) 113(H) 100(H)  BUN 8 - 23 mg/dL 69(H) 38(H) 64(H)  Creatinine 0.61 - 1.24 mg/dL 7.37(H) 5.56(H) 9.07(H)  Sodium 135 - 145 mmol/L 132(L) 135 135  Potassium 3.5 - 5.1 mmol/L 4.4 4.5 4.7  Chloride 98 - 111 mmol/L 94(L) 95(L) 94(L)  CO2 22 - 32 mmol/L 28 28 25   Calcium 8.9 - 10.3 mg/dL 8.3(L) 8.4(L) 8.7(L)  Total Protein 6.5 - 8.1 g/dL - 5.5(L) -  Total Bilirubin 0.3 - 1.2 mg/dL - 0.6 -  Alkaline Phos 38 - 126 U/L - 50 -  AST 15 - 41 U/L - 18 -  ALT 0 - 44 U/L - 12 -     Radiology Studies: No results found.   Scheduled Meds:  sodium chloride   Intravenous Once   sodium chloride   Intravenous Once   sodium chloride   Intravenous Once   allopurinol  100 mg Oral QHS   apixaban  5 mg Oral BID   atorvastatin  40 mg Oral QHS   cefadroxil  500 mg Oral Once   cefadroxil  500 mg Oral Q36H   Chlorhexidine Gluconate Cloth  6 each Topical Q0600   feeding supplement  237 mL Oral TID BM   feeding supplement (NEPRO CARB STEADY)  237 mL Oral BID BM   folic acid  1 mg Oral Daily   insulin aspart  0-9 Units Subcutaneous Q4H   multivitamin  1 tablet Oral QHS   predniSONE  5 mg Oral Q breakfast   sevelamer carbonate  800 mg Oral TID WC   Continuous Infusions:     LOS: 4 days    Time spent: 46  Nita Sells, MD [facility] To contact the attending provider between 7A-7P or the covering provider during after hours 7P-7A, please log into the web site [email or web address] and access using universal [place] password for that web site. If you do not have the password, please call the hospital operator.  06/07/2021, 10:27 AM

## 2021-06-07 NOTE — Progress Notes (Signed)
Date and time results received: 06/07/21 2157 (use smartphrase ".now" to insert current time)  Test: CBG Critical Value: 19  Name of Provider Notified: will page [facility]  Orders Received? Or Actions Taken?: gave grape juice and ensure, rechecked at 2241 and it came back to 106

## 2021-06-08 ENCOUNTER — Encounter: Payer: Medicare Other | Admitting: Physical Therapy

## 2021-06-08 ENCOUNTER — Encounter (HOSPITAL_COMMUNITY): Payer: Self-pay

## 2021-06-08 ENCOUNTER — Encounter: Payer: Self-pay | Admitting: Physical Therapy

## 2021-06-08 DIAGNOSIS — N186 End stage renal disease: Secondary | ICD-10-CM

## 2021-06-08 DIAGNOSIS — E782 Mixed hyperlipidemia: Secondary | ICD-10-CM

## 2021-06-08 DIAGNOSIS — Z992 Dependence on renal dialysis: Secondary | ICD-10-CM

## 2021-06-08 DIAGNOSIS — S72001A Fracture of unspecified part of neck of right femur, initial encounter for closed fracture: Secondary | ICD-10-CM

## 2021-06-08 DIAGNOSIS — Z8673 Personal history of transient ischemic attack (TIA), and cerebral infarction without residual deficits: Secondary | ICD-10-CM

## 2021-06-08 DIAGNOSIS — W19XXXA Unspecified fall, initial encounter: Secondary | ICD-10-CM

## 2021-06-08 DIAGNOSIS — Z8679 Personal history of other diseases of the circulatory system: Secondary | ICD-10-CM

## 2021-06-08 DIAGNOSIS — I5020 Unspecified systolic (congestive) heart failure: Secondary | ICD-10-CM

## 2021-06-08 LAB — HEPATITIS B SURFACE ANTIBODY, QUANTITATIVE: Hep B S AB Quant (Post): 5.7 m[IU]/mL — ABNORMAL LOW (ref >9.9)

## 2021-06-08 LAB — CBC WITH DIFFERENTIAL/PLATELET
Abs Immature Granulocytes: 0.04 10*3/uL (ref 0.00–0.07)
Basophils Absolute: 0 10*3/uL (ref 0.0–0.1)
Basophils Relative: 1 %
Eosinophils Absolute: 0.2 10*3/uL (ref 0.0–0.5)
Eosinophils Relative: 3 %
HCT: 24.6 % — ABNORMAL LOW (ref 39.0–52.0)
Hemoglobin: 8.3 g/dL — ABNORMAL LOW (ref 13.0–17.0)
Immature Granulocytes: 1 %
Lymphocytes Relative: 14 %
Lymphs Abs: 0.9 10*3/uL (ref 0.7–4.0)
MCH: 30.1 pg (ref 26.0–34.0)
MCHC: 33.7 g/dL (ref 30.0–36.0)
MCV: 89.1 fL (ref 80.0–100.0)
Monocytes Absolute: 0.4 10*3/uL (ref 0.1–1.0)
Monocytes Relative: 6 %
Neutro Abs: 4.8 10*3/uL (ref 1.7–7.7)
Neutrophils Relative %: 75 %
Platelets: 170 10*3/uL (ref 150–400)
RBC: 2.76 MIL/uL — ABNORMAL LOW (ref 4.22–5.81)
RDW: 21.1 % — ABNORMAL HIGH (ref 11.5–15.5)
WBC: 6.3 10*3/uL (ref 4.0–10.5)
nRBC: 0 % (ref 0.0–0.2)

## 2021-06-08 LAB — RENAL FUNCTION PANEL
Albumin: 2.5 g/dL — ABNORMAL LOW (ref 3.5–5.0)
Anion gap: 11 (ref 5–15)
BUN: 57 mg/dL — ABNORMAL HIGH (ref 8–23)
CO2: 25 mmol/L (ref 22–32)
Calcium: 8 mg/dL — ABNORMAL LOW (ref 8.9–10.3)
Chloride: 97 mmol/L — ABNORMAL LOW (ref 98–111)
Creatinine, Ser: 6.62 mg/dL — ABNORMAL HIGH (ref 0.61–1.24)
GFR, Estimated: 8 mL/min — ABNORMAL LOW (ref >60)
Glucose, Bld: 286 mg/dL — ABNORMAL HIGH (ref 70–99)
Phosphorus: 4.2 mg/dL (ref 2.5–4.6)
Potassium: 4.5 mmol/L (ref 3.5–5.1)
Sodium: 133 mmol/L — ABNORMAL LOW (ref 135–145)

## 2021-06-08 LAB — GLUCOSE, CAPILLARY
Glucose-Capillary: 260 mg/dL — ABNORMAL HIGH (ref 70–99)
Glucose-Capillary: 160 mg/dL — ABNORMAL HIGH (ref 70–99)
Glucose-Capillary: 168 mg/dL — ABNORMAL HIGH (ref 70–99)
Glucose-Capillary: 162 mg/dL — ABNORMAL HIGH (ref 70–99)
Glucose-Capillary: 214 mg/dL — ABNORMAL HIGH (ref 70–99)

## 2021-06-08 LAB — HEPATITIS B E ANTIBODY: Hep B E Ab: NEGATIVE

## 2021-06-08 NOTE — Progress Notes (Signed)
     Subjective:  Patient reports pain as well controlled this morning.  Doing better with PT now with prosthesis. Wife at bedside. No new issues.  Hemoglobin this morning 8.3.   Objective:   VITALS:   Vitals:   06/07/21 2202 06/08/21 0243 06/08/21 0617 06/08/21 0700  BP: (!) 159/66 (!) 143/60 (!) 150/63 (!) 149/62  Pulse: 88 88 88 91  Resp: 17 17 17 18   Temp: 97.7 F (36.5 C) 98 F (36.7 C) 98.2 F (36.8 C) 98.7 F (37.1 C)  TempSrc: Oral Oral Oral Oral  SpO2: 100% 100% 100% 99%  Weight:      Height:        Sensation intact distally Dorsiflexion/Plantar flexion intact Incision: dressing C/D/I, no strikethrough on Aquacel dressing Moderate expected postoperative swelling over the lateral hip Compartments soft    Lab Results  Component Value Date   WBC 6.3 06/08/2021   HGB 8.3 (L) 06/08/2021   HCT 24.6 (L) 06/08/2021   MCV 89.1 06/08/2021   PLT 170 06/08/2021   BMET    Component Value Date/Time   NA 133 (L) 06/08/2021 0219   K 4.5 06/08/2021 0219   CL 97 (L) 06/08/2021 0219   CO2 25 06/08/2021 0219   GLUCOSE 286 (H) 06/08/2021 0219   BUN 57 (H) 06/08/2021 0219   CREATININE 6.62 (H) 06/08/2021 0219   CALCIUM 8.0 (L) 06/08/2021 0219   GFRNONAA 8 (L) 06/08/2021 0219     Assessment/Plan: 4 Days Post-Op   Principal Problem:   Hip fracture ([name]) Active Problems:   History of coronary artery disease   ESRD (end stage renal disease) on dialysis ([name])   Hyperlipidemia   CHF (congestive heart failure) ([name])  Left hip hemiarthroplasty for femoral neck fracture  Post op recs: WB: WBAT with posterior hip precautions x6 weeks Abx: ancef x23 hours post op, followed by cefadroxil 500 twice daily for 7 days given increased risk for postop infection given the patient's medical comorbidities Dressing: Aquacel dressing to be kept intact until follow-up Imaging: PACU xrays DVT prophylaxis: Eliquis 2.5 twice daily for postop day 1-2 followed by resuming full dose  anticoagulation Eliquis 5 twice daily starting postop day 3 Follow up: 2 weeks after surgery for a wound check    Jonathan [name] A Yousuf Ager 06/08/2021, 8:33 AM   Charlies Constable, MD Cell [phone]  Contact information:   [id number] 7am-5pm epic message Dr. Zachery Dakins, or call office for patient follow up: [phone] After hours and holidays please check [email or web address] for group call information for [facility]

## 2021-06-08 NOTE — Plan of Care (Signed)

## 2021-06-08 NOTE — Progress Notes (Signed)
Physical Therapy Treatment Patient Details Name: Jonathan CRAGO Sr. MRN: [id number] DOB: [birth year] Today's Date: 06/08/2021   History of Present Illness 69 y.o. male with past medical history of chronic systolic congestive heart failure, coronary artery disease, peripheral vascular disease, diabetes mellitus, end-stage renal disease, hypertension, hyperlipidemia, history of LV mural thrombus, prior CVA, paroxysmal H fibrillation, dementia admitted with hip fracture.  S/p left hip hemiarthroplast 06/04/21.    PT Comments    Patient progressing slowly towards PT goals. Continues to require Mod-Max A of 2 for bed mobility, and standing transfers with step by step cues for sequencing/technique. Pt requires Min A for sitting balance and needs Max A to donn prosthesis. Able to stand in stedy for 30 seconds and 1 min for pericare with assist. Fatigues. Limited by pain/weakness. Not able to recall posterior hip precautions or there ex from prior session. Will continue to follow and progress as tolerated.   Recommendations for follow up therapy are one component of a multi-disciplinary discharge planning process, led by the attending physician.  Recommendations may be updated based on patient status, additional functional criteria and insurance authorization.  Follow Up Recommendations  Acute inpatient rehab (3hours/day)     Assistance Recommended at Discharge Frequent or constant Supervision/Assistance  Equipment Recommendations  Other (comment) (defer to next venue)    Recommendations for Other Services       Precautions / Restrictions Precautions Precautions: Posterior Hip Precaution Booklet Issued: Yes (comment) Precaution Comments: Reviewed with pt and wife Required Braces or Orthoses: Other Brace Other Brace: right prosthesis Restrictions Weight Bearing Restrictions: Yes LLE Weight Bearing: Weight bearing as tolerated     Mobility  Bed Mobility Overal bed mobility: Needs  Assistance Bed Mobility: Supine to Sit     Supine to sit: Mod assist;+2 for physical assistance;HOB elevated     General bed mobility comments: Assist with LLE and trunk to get to EOB with cues for technique and sequencing, posterior bias. Assist with scooting bottom to EOB. Step by step cues needed, difficulty sequencing.    Transfers Overall transfer level: Needs assistance Equipment used: Ambulation equipment used Transfers: Sit to/from Stand Sit to Stand: Max assist;Mod assist;+2 physical assistance;From elevated surface           General transfer comment: Assist of 2 to power to standing from elevated EOB with cues for hand placement, anterior translation, and cues for placement of RLE under BoS, stood from EOB x2 with stedy, difficulty figuring out how to pull up with stedy bar and pushed away. Stood from Network engineer.  Transferred to chair via stedy. Transfer via Lift Equipment: Stedy  Ambulation/Gait             General Gait Details: UNable   Marine scientist Rankin (Stroke Patients Only)       Balance Overall balance assessment: Needs assistance Sitting-balance support: Feet supported;Single extremity supported Sitting balance-Leahy Scale: Poor Sitting balance - Comments: Pt leaning towards the right needing cues for upright, difficulty maintaining balance while trying to donn prosthesis. Max A to donn prosthesis   Standing balance support: During functional activity Standing balance-Leahy Scale: Poor Standing balance comment: mod assist to remain standing with moments of Min A with cues for upright/knee extensio; stood for 30 seconds and 1 minute for pericare, fatigues.  Cognition Arousal/Alertness: Awake/alert Behavior During Therapy: Flat affect Overall Cognitive Status: History of cognitive impairments - at baseline                                  General Comments: Minimal verbalizations which is baseline per wife. Needs step by step cues to perform tasks, for problem solving, Difficulty sequencing.        Exercises      General Comments General comments (skin integrity, edema, etc.): Wife stepped out during session.      Pertinent Vitals/Pain Pain Assessment: Faces Faces Pain Scale: Hurts even more Pain Location: Lt hip Pain Descriptors / Indicators: Operative site guarding;Grimacing;Guarding Pain Intervention(s): Monitored during session;Repositioned;Limited activity within patient's tolerance    Home Living                          Prior Function            PT Goals (current goals can now be found in the care plan section) Progress towards PT goals: Progressing toward goals (slowly)    Frequency    Min 3X/week      PT Plan Current plan remains appropriate    Co-evaluation              AM-PAC PT "6 Clicks" Mobility   Outcome Measure  Help needed turning from your back to your side while in a flat bed without using bedrails?: A Lot Help needed moving from lying on your back to sitting on the side of a flat bed without using bedrails?: A Lot Help needed moving to and from a bed to a chair (including a wheelchair)?: Total Help needed standing up from a chair using your arms (e.g., wheelchair or bedside chair)?: A Lot Help needed to walk in hospital room?: Total Help needed climbing 3-5 steps with a railing? : Total 6 Click Score: 9    End of Session Equipment Utilized During Treatment: Gait belt Activity Tolerance: Patient limited by pain;Patient tolerated treatment well Patient left: in chair;with call bell/phone within reach;with chair alarm set Nurse Communication: Mobility status;Need for lift equipment (stedy) PT Visit Diagnosis: Unsteadiness on feet (R26.81);Muscle weakness (generalized) (M62.81);History of falling (Z91.81);Difficulty in walking, not elsewhere classified  (R26.2);Pain Pain - Right/Left: Left Pain - part of body: Hip     Time: 1115-1150 PT Time Calculation (min) (ACUTE ONLY): 35 min  Charges:  $Therapeutic Activity: 23-37 mins                     Marisa Severin, PT, DPT Acute Rehabilitation Services Pager [phone] Office [phone]      Jonathan [name] 06/08/2021, 12:59 PM

## 2021-06-08 NOTE — Progress Notes (Signed)
Patient states. "I want to get off." Patient encouraged to complete treatment. When asked why he would like to terminate treatment patient replies "I want to get off." Treatment terminated.

## 2021-06-08 NOTE — Progress Notes (Signed)
PROGRESS NOTE  Jonathan HALIBURTON Sr.    DOB: 1951/01/17, 70 y.o.  XNT:[id number]  PCP: Ginger Organ., MD   Code Status: Full Code   DOA: 06/03/2021   LOS: 5  Brief Narrative of Current Hospitalization  Jonathan Mohammed Kindle Sr. is a 70 y.o. 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 They presented from home via EMS to the ED on 06/03/2021 with mechanical fall due to prosthetic leg malfunction while standing up at home resulting in left femoral neck fracture with mild displacement.  Ortho surgery was consulted and performed fixation 10/28.  Patient was admitted to medicine service for further workup and management of chronic conditions as outlined in detail below.  06/08/21 -stable  Assessment & Plan  Principal Problem:   Hip fracture ([place]) Active Problems:   History of coronary artery disease   ESRD (end stage renal disease) on dialysis ([name])   Hyperlipidemia   CHF (congestive heart failure) ([name])  Left femoral neck fracture with mild displacement- S/p fixation 10/28. -Ortho following, appreciate recommendations  -Cefadroxil (10/29-11/5)  -Eliquis resumed at full dose 5 mg twice daily -Follow-up outpatient Ortho in 2 weeks postop for wound check -Pending CIR discharge -Continue PT/OT  HFrEF  CAD with history of MI  prior ventricle thrombus  PAD  HTN-vital signs remained stable with intermittent mild systolic hypertension. -Continue home Eliquis -Continue to monitor  Acute on chronic anemia secondary to blood loss from surgery and renal failure.  Hemoglobin has been stable around baseline of 7-8 since blood transfusion on 10/30. -CBC a.m.  ESRD MWF HD-electrolytes stable.  Patient made small amount of urine today. -Management per nephrology- following,  appreciate recommendations - continue home prednisone for renal transplant -Continue home Renvela -RFP a.m.  History of CVA  dementia  HLD-stable, baseline -Continue home atorvastatin  Type II DM-diet controlled.  Blood sugars likely elevated with chronic prednisone use.  Fasting glucose this morning was 160 -Monitor blood sugars with daily labs -sSSI  Chronic hepatitis C  History of gout-no acute flareups -Continue home allopurinol  DVT prophylaxis:  apixaban (ELIQUIS) tablet 5 mg   Diet:  Diet Orders (From admission, onward)     Start     Ordered   06/08/21 0621  Diet Carb Modified Fluid consistency: Thin; Room service appropriate? Yes  Diet effective now       Question Answer Comment  Diet-HS Snack? Nothing   Calorie Level Medium 1600-2000   Fluid consistency: Thin   Room service appropriate? Yes      06/08/21 0620            Subjective 06/08/21    Pt reports doing well today.  He denies any complaints.  Disposition Plan & Communication  Patient status: Inpatient  Admitted From: Home Disposition: Rehabilitation facility Anticipated discharge date: 11/1  Family Communication: None Consults, Procedures, Significant Events  Consultants:  Ortho surgery CIR Nephrology  Procedures/significant events:  HD Hip fixation 10/28  Antimicrobials:  Anti-infectives (From admission, onward)    Start     Dose/Rate Route Frequency Ordered Stop   06/07/21 1700  cefadroxil (DURICEF) capsule 500 mg        500 mg Oral Every 36 hours 06/07/21 1521 06/12/21 0459   06/07/21 1000  cefadroxil (DURICEF) capsule 500 mg  Status:  Discontinued        500 mg Oral Every 36 hours 06/05/21 2252 06/07/21 1521   06/05/21 2345  cefadroxil (DURICEF) capsule 500 mg  Status:  Discontinued        500 mg Oral  Once 06/05/21 2252 06/07/21 1521   06/05/21 2100  cefadroxil (DURICEF) capsule 500 mg  Status:  Discontinued        500 mg Oral 2 times daily 06/05/21 0603 06/05/21 2252    06/05/21 0800  ceFAZolin (ANCEF) IVPB 2g/100 mL premix        2 g 200 mL/hr over 30 Minutes Intravenous  Once 06/04/21 1510 06/05/21 1029   06/04/21 0945  ceFAZolin (ANCEF) IVPB 2g/100 mL premix        2 g 200 mL/hr over 30 Minutes Intravenous On call to O.R. 06/04/21 0933 06/04/21 1119   06/04/21 0935  ceFAZolin (ANCEF) 2-4 GM/100ML-% IVPB  Status:  Discontinued       Note to Pharmacy: Roosvelt Maser   : cabinet override      06/04/21 0935 06/04/21 0939   06/04/21 0846  ceFAZolin (ANCEF) 2-4 GM/100ML-% IVPB       Note to Pharmacy: Roosvelt Maser   : cabinet override      06/04/21 0846 06/04/21 1128   06/03/21 1715  cefTRIAXone (ROCEPHIN) 1 g in sodium chloride 0.9 % 100 mL IVPB  Status:  Discontinued        1 g 200 mL/hr over 30 Minutes Intravenous  Once 06/03/21 1712 06/03/21 1805   06/03/21 1715  azithromycin (ZITHROMAX) 500 mg in sodium chloride 0.9 % 250 mL IVPB  Status:  Discontinued        500 mg 250 mL/hr over 60 Minutes Intravenous  Once 06/03/21 1712 06/03/21 1805       Objective   Vitals:   06/07/21 2202 06/08/21 0243 06/08/21 0617 06/08/21 0700  BP: (!) 159/66 (!) 143/60 (!) 150/63 (!) 149/62  Pulse: 88 88 88 91  Resp: 17 17 17 18   Temp: 97.7 F (36.5 C) 98 F (36.7 C) 98.2 F (36.8 C) 98.7 F (37.1 C)  TempSrc: Oral Oral Oral Oral  SpO2: 100% 100% 100% 99%  Weight:      Height:        Intake/Output Summary (Last 24 hours) at 06/08/2021 0933 Last data filed at 06/07/2021 2046 Gross per 24 hour  Intake 315 ml  Output 1951 ml  Net -1636 ml   Filed Weights   06/04/21 2016 06/07/21 1723 06/07/21 2046  Weight: 62 kg 64.5 kg 62 kg    Patient BMI: Body mass index is 20.18 kg/m.   Physical Exam: General: awake, alert, NAD Respiratory: normal respiratory effort. Cardiovascular: normal S1/S2,  RRR, no JVD, murmurs, rubs, gallops, quick capillary refill  Nervous: Alert, oriented to self.  Delayed and limited speech Extremities: Right BKA, trace edema  bilaterally, dressing intact on incision Skin: dry, intact, normal temperature, normal color, No rashes, lesions or ulcers Psychiatry: Flat mood, congruent affect  Labs   I have personally reviewed following labs and imaging studies No results displayed because visit has over 200 results.      Imaging Studies  IR THROMBECTOMY AV FISTULA W/THROMBOLYSIS/PTA/STENT INC SHUNT/IMG RT  Result Date: 06/07/2021 INDICATION: End-stage renal disease on hemodialysis via a right upper extremity arteriovenous fistula. Fistula currently working well and patient able to receive full dialysis, however there is some clinical concern over swelling of the right upper extremity. He presents for  inpatient fistulagram EXAM: Fistulagram MEDICATIONS: None. ANESTHESIA/SEDATION: None. FLUOROSCOPY TIME:  Fluoroscopy Time: 0 minutes 42 seconds (8 mGy). COMPLICATIONS: None immediate. PROCEDURE: Informed written consent was obtained from the patient after a thorough discussion of the procedural risks, benefits and alternatives. All questions were addressed. Maximal Sterile Barrier Technique was utilized including caps, mask, sterile gowns, sterile gloves, sterile drape, hand hygiene and skin antiseptic. A timeout was performed prior to the initiation of the procedure. The fistula was accessed with an 18 gauge angio capsular and diagnostic fistulography performed. This is a right upper extremity brachiocephalic arteriovenous fistula. The arterial anastomosis is widely patent. There is irregular aneurysm formation in the access zone at the distal upper arm. The proximal draining vein is patent. A stent system is present and patent throughout the majority of the length of the cephalic vein. There is mild in stent stenosis in the most proximal stent at the level of the mid upper arm. This does not appear flow limiting and the degree of stenosis is less than 40%. No evidence of central venous stenosis or collateral formation. No defect  present that would require intervention at this time. IMPRESSION: 1. Patent and functioning right upper extremity brachiocephalic arteriovenous fistula. 2. Mild in-stent stenosis of the most peripheral of the stent system in the draining cephalic vein. Stenoses remain less than 40% and do not appear flow limiting. No intervention performed. 3. Aneurysmal dilation of the fistula in the access zone at the distal upper arm. If there is evidence of prolonged bleeding, or clinical concern for increased risk of rupture, then referral to vascular surgery for aneurysmorrhaphy could be considered. ACCESS: This access remains amenable to future percutaneous interventions as clinically indicated. Electronically Signed   By: Jacqulynn Cadet M.D.   On: 06/07/2021 14:25   Medications   Scheduled Meds:  sodium chloride   Intravenous Once   sodium chloride   Intravenous Once   sodium chloride   Intravenous Once   allopurinol  100 mg Oral QHS   apixaban  5 mg Oral BID   atorvastatin  40 mg Oral QHS   cefadroxil  500 mg Oral Q36H   Chlorhexidine Gluconate Cloth  6 each Topical Q0600   feeding supplement  237 mL Oral TID BM   feeding supplement (NEPRO CARB STEADY)  237 mL Oral BID BM   folic acid  1 mg Oral Daily   insulin aspart  0-9 Units Subcutaneous Q4H   multivitamin  1 tablet Oral QHS   predniSONE  5 mg Oral Q breakfast   sevelamer carbonate  800 mg Oral TID WC   No recently discontinued medications to reconcile  LOS: 5 days   Time spent: >47min  Jonathan Bartow L Elice Crigger, DO [facility] 06/08/2021, 9:33 AM   Please refer to amion to contact the [facility] Attending or Consulting provider for this pt  [email or web address] Available by Epic secure chat 7AM-7PM. If 7PM-7AM, please contact night-coverage

## 2021-06-08 NOTE — Progress Notes (Signed)
Inpatient Rehab Admissions Coordinator:   I do not yet have insurance auth or a CIR bed for this pt. Today. I will continue to follow for potential admit pending insurance auth and bed availability.   Clemens Catholic, [place], [place] Admissions Coordinator  [phone] ([place]) [phone] (office)

## 2021-06-08 NOTE — Therapy (Signed)
[facility] Physical Therapy [address] [place], [place], [address] Phone: [phone]   Fax:  [phone]  Patient Details  Name: Jonathan SHAMBLIN Sr. MRN: [id number] Date of Birth: 10-Feb-1951 Referring Provider:  Jamelle Haring  Encounter Date: 06/08/2021  PHYSICAL THERAPY DISCHARGE SUMMARY  Visits from Start of Care: 15  Current functional level related to goals / functional outcomes: See last PT note on 05/27/2021   Remaining deficits: Patient fell on 06/03/2021 per his wife when he tried to get up without his walker. He fractured his hip requiring surgery.    Education / Equipment: Prosthetic care   Patient agrees to discharge. Patient goals were not met. Patient is being discharged due to a change in medical status.   Jamey Reas, PT, DPT 06/08/2021, 9:02 AM  [facility] Physical Therapy [address] [place], [place], [address] Phone: [phone]   Fax:  [phone]

## 2021-06-08 NOTE — Progress Notes (Addendum)
[facility] Progress Note   Subjective: Up in chair, no C/Os today. HD tomorrow on schedule. Wife at bedside.   Objective Vitals:   06/07/21 2202 06/08/21 0243 06/08/21 0617 06/08/21 0700  BP: (!) 159/66 (!) 143/60 (!) 150/63 (!) 149/62  Pulse: 88 88 88 91  Resp: 17 17 17 18   Temp: 97.7 F (36.5 C) 98 F (36.7 C) 98.2 F (36.8 C) 98.7 F (37.1 C)  TempSrc: Oral Oral Oral Oral  SpO2: 100% 100% 100% 99%  Weight:      Height:       Physical Exam General: Older male with flat affect, NAD Heart:S1,S2 no M/R/G Lungs: CTAB A/P  No WOB Abdomen: NABS, NT Extremities: R BKA no stump edema. LLE without edema.  Dialysis Access: R AVF +T/B mild edema.     Additional Objective Labs: Basic Metabolic Panel: Recent Labs  Lab 06/06/21 0259 06/07/21 0740 06/08/21 0219  NA 132* 134* 133*  K 4.4 4.8 4.5  CL 94* 94* 97*  CO2 28 27 25   GLUCOSE 176* 98 286*  BUN 69* 92* 57*  CREATININE 7.37* 9.42* 6.62*  CALCIUM 8.3* 8.4* 8.0*  PHOS 5.9* 5.3* 4.2   Liver Function Tests: Recent Labs  Lab 06/05/21 0320 06/06/21 0259 06/07/21 0740 06/08/21 0219  AST 18  --   --   --   ALT 12  --   --   --   ALKPHOS 50  --   --   --   BILITOT 0.6  --   --   --   PROT 5.5*  --   --   --   ALBUMIN 2.8* 2.4* 2.4* 2.5*   No results for input(s): LIPASE, AMYLASE in the last 168 hours. CBC: Recent Labs  Lab 06/05/21 0320 06/06/21 0259 06/06/21 1426 06/07/21 0216 06/07/21 0748 06/08/21 0219  WBC 7.8 7.9  --  8.1 7.7 6.3  NEUTROABS 6.2 6.0  --  6.3  --  4.8  HGB 8.8* 6.4*   < > 7.2* 7.1* 8.3*  HCT 27.2* 18.9*   < > 21.9* 21.5* 24.6*  MCV 96.8 95.9  --  94.0 94.3 89.1  PLT 152 144*  --  142* 154 170   < > = values in this interval not displayed.   Blood Culture    Component Value Date/Time   SDES BLOOD SITE NOT SPECIFIED 10/30/2020 1307   SPECREQUEST  10/30/2020 1307    BOTTLES DRAWN AEROBIC ONLY Blood Culture results may not be optimal due to an inadequate volume of  blood received in culture bottles   CULT  10/30/2020 1307    NO GROWTH 5 DAYS Performed at [facility], [place] [address], [place], [place] [address]    REPTSTATUS 11/04/2020 FINAL 10/30/2020 1307    Cardiac Enzymes: No results for input(s): CKTOTAL, CKMB, CKMBINDEX, TROPONINI in the last 168 hours. CBG: Recent Labs  Lab 06/07/21 2157 06/07/21 2241 06/08/21 0237 06/08/21 0613 06/08/21 1159  GLUCAP 65* 106* 260* 160* 168*   Iron Studies: No results for input(s): IRON, TIBC, TRANSFERRIN, FERRITIN in the last 72 hours. @lablastinr3 @ Studies/Results: No results found. Medications:   sodium chloride   Intravenous Once   sodium chloride   Intravenous Once   sodium chloride   Intravenous Once   allopurinol  100 mg Oral QHS   apixaban  5 mg Oral BID   atorvastatin  40 mg Oral QHS   cefadroxil  500 mg Oral Q36H   Chlorhexidine Gluconate  Cloth  6 each Topical Q0600   feeding supplement  237 mL Oral TID BM   feeding supplement (NEPRO CARB STEADY)  237 mL Oral BID BM   folic acid  1 mg Oral Daily   insulin aspart  0-9 Units Subcutaneous Q4H   multivitamin  1 tablet Oral QHS   predniSONE  5 mg Oral Q breakfast   sevelamer carbonate  800 mg Oral TID WC     Dialysis Orders:  Center: [facility]  on MWF . 180NRe, 4 hour 57min, BFR 400, DFR Auto 1.5, EDW 61.7kg, 2K, 2Ca, AVF 15g Heparin 3000 unit bolus  Calcitriol 1.6mcg PO q HD   Assessment/Plan  Left femoral neck fracture: Orthopedic surgery status post 10/28 surgery  per ortho  ESRD:  Dialysis on MWF schedule next HD 06/09/2021 Right upper arm/AV fistula swelling: IR consulted to evaluate.  Went for F'gram yesterday. No intervention.  Hypertension/volume: No evidence of volume excess by exam. BP controlled. UF as tolerated.   Anemia: HGB 8.3 S/P 1 unit of PRBCs 06/07/2021 now S/P 2 units PRBCs since adm. Follow HGB. Give Aranesp with HD tomorrow. Follow HGB.   metabolic bone disease: Calcium controlled.  Phosphorus 5.9 continue  calcitriol and binders.   Nutrition: ALB 2.4 protein supplement to diet, renal/carb modified CAD/a.fib/hx of LV thrombus: cardiology consulted  cleared for hip surgery . 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 Muadh Creasy NP-C 06/08/2021, 12:56 PM  [employer] [phone]

## 2021-06-09 LAB — GLUCOSE, CAPILLARY
Glucose-Capillary: 101 mg/dL — ABNORMAL HIGH (ref 70–99)
Glucose-Capillary: 126 mg/dL — ABNORMAL HIGH (ref 70–99)
Glucose-Capillary: 142 mg/dL — ABNORMAL HIGH (ref 70–99)
Glucose-Capillary: 192 mg/dL — ABNORMAL HIGH (ref 70–99)
Glucose-Capillary: 90 mg/dL (ref 70–99)

## 2021-06-09 LAB — RENAL FUNCTION PANEL
Albumin: 2.4 g/dL — ABNORMAL LOW (ref 3.5–5.0)
Anion gap: 10 (ref 5–15)
BUN: 77 mg/dL — ABNORMAL HIGH (ref 8–23)
CO2: 29 mmol/L (ref 22–32)
Calcium: 8.5 mg/dL — ABNORMAL LOW (ref 8.9–10.3)
Chloride: 96 mmol/L — ABNORMAL LOW (ref 98–111)
Creatinine, Ser: 8.02 mg/dL — ABNORMAL HIGH (ref 0.61–1.24)
GFR, Estimated: 7 mL/min — ABNORMAL LOW (ref >60)
Glucose, Bld: 180 mg/dL — ABNORMAL HIGH (ref 70–99)
Phosphorus: 3.8 mg/dL (ref 2.5–4.6)
Potassium: 5.1 mmol/L (ref 3.5–5.1)
Sodium: 135 mmol/L (ref 135–145)

## 2021-06-09 LAB — CBC
HCT: 22.2 % — ABNORMAL LOW (ref 39.0–52.0)
Hemoglobin: 7.2 g/dL — ABNORMAL LOW (ref 13.0–17.0)
MCH: 29.3 pg (ref 26.0–34.0)
MCHC: 32.4 g/dL (ref 30.0–36.0)
MCV: 90.2 fL (ref 80.0–100.0)
Platelets: 177 10*3/uL (ref 150–400)
RBC: 2.46 MIL/uL — ABNORMAL LOW (ref 4.22–5.81)
RDW: 20 % — ABNORMAL HIGH (ref 11.5–15.5)
WBC: 7.2 10*3/uL (ref 4.0–10.5)
nRBC: 0 % (ref 0.0–0.2)

## 2021-06-09 LAB — PREPARE RBC (CROSSMATCH)

## 2021-06-09 MED ORDER — LIDOCAINE-PRILOCAINE 2.5-2.5 % EX CREA: 1.0000 "application " | TOPICAL_CREAM | CUTANEOUS | Status: DC | PRN

## 2021-06-09 MED ORDER — SODIUM CHLORIDE 0.9 % IV SOLN: 100.0000 mL | INTRAVENOUS | Status: DC | PRN

## 2021-06-09 MED ORDER — SODIUM CHLORIDE 0.9% IV SOLUTION: Freq: Once | INTRAVENOUS | Status: AC

## 2021-06-09 MED ORDER — LIDOCAINE HCL (PF) 1 % IJ SOLN
5.0000 mL | INTRAMUSCULAR | Status: DC | PRN
  Filled 2021-06-09: qty 5

## 2021-06-09 MED ORDER — PENTAFLUOROPROP-TETRAFLUOROETH EX AERO: 1.0000 "application " | INHALATION_SPRAY | CUTANEOUS | Status: DC | PRN

## 2021-06-09 MED ORDER — DARBEPOETIN ALFA 100 MCG/0.5ML IJ SOSY
100.0000 ug | PREFILLED_SYRINGE | INTRAMUSCULAR | Status: DC
Start: 2021-06-09 — End: 2021-06-10
  Administered 2021-06-09: 100 ug via INTRAVENOUS
  Filled 2021-06-09: qty 0.5

## 2021-06-09 NOTE — Progress Notes (Signed)
[facility] Progress Note   Subjective: Seen on HD via AVF. 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 LLE without edema.  Dialysis Access: R AVF +T/B mild edema.    Additional Objective Labs: Basic Metabolic Panel: Recent Labs  Lab 06/07/21 0740 06/08/21 0219 06/09/21 0217  NA 134* 133* 135  K 4.8 4.5 5.1  CL 94* 97* 96*  CO2 27 25 29   GLUCOSE 98 286* 180*  BUN 92* 57* 77*  CREATININE 9.42* 6.62* 8.02*  CALCIUM 8.4* 8.0* 8.5*  PHOS 5.3* 4.2 3.8   Liver Function Tests: Recent Labs  Lab 06/05/21 0320 06/06/21 0259 06/07/21 0740 06/08/21 0219 06/09/21 0217  AST 18  --   --   --   --   ALT 12  --   --   --   --   ALKPHOS 50  --   --   --   --   BILITOT 0.6  --   --   --   --   PROT 5.5*  --   --   --   --   ALBUMIN 2.8*   < > 2.4* 2.5* 2.4*   < > = values in this interval not displayed.   No results for input(s): LIPASE, AMYLASE in the last 168 hours. CBC: Recent Labs  Lab 06/06/21 0259 06/06/21 1426 06/07/21 0216 06/07/21 0748 06/08/21 0219 06/09/21 0217  WBC 7.9  --  8.1 7.7 6.3 7.2  NEUTROABS 6.0  --  6.3  --  4.8  --   HGB 6.4*   < > 7.2* 7.1* 8.3* 7.2*  HCT 18.9*   < > 21.9* 21.5* 24.6* 22.2*  MCV 95.9  --  94.0 94.3 89.1 90.2  PLT 144*  --  142* 154 170 177   < > = values in this interval not displayed.   Blood Culture    Component Value Date/Time   SDES BLOOD SITE NOT SPECIFIED 10/30/2020 1307   SPECREQUEST  10/30/2020 1307    BOTTLES DRAWN AEROBIC ONLY Blood Culture results may not be optimal due to an inadequate volume of blood received in culture  bottles   CULT  10/30/2020 1307    NO GROWTH 5 DAYS Performed at [facility], [place] [address], [place], [place] [address]    REPTSTATUS 11/04/2020 FINAL 10/30/2020 1307    Cardiac Enzymes: No results for input(s): CKTOTAL, CKMB, CKMBINDEX, TROPONINI in the last 168 hours. CBG: Recent Labs  Lab 06/08/21 1654 06/08/21 2136 06/09/21 0036 06/09/21 0643 06/09/21 1114  GLUCAP 162* 214* 192* 90 126*   Iron Studies: No results for input(s): IRON, TIBC, TRANSFERRIN, FERRITIN in the last 72 hours. @lablastinr3 @ Studies/Results: No results found. Medications:  sodium chloride     sodium chloride      sodium chloride   Intravenous Once   allopurinol  100 mg Oral QHS   apixaban  5 mg Oral BID   atorvastatin  40 mg Oral QHS  cefadroxil  500 mg Oral Q36H   Chlorhexidine Gluconate Cloth  6 each Topical Q0600   feeding supplement  237 mL Oral TID BM   feeding supplement (NEPRO CARB STEADY)  237 mL Oral BID BM   folic acid  1 mg Oral Daily   insulin aspart  0-9 Units Subcutaneous Q4H   multivitamin  1 tablet Oral QHS   predniSONE  5 mg Oral Q breakfast   sevelamer carbonate  800 mg Oral TID WC     Dialysis Orders:  Center: [facility]  on MWF . 180NRe, 4 hour 22min, BFR 400, DFR Auto 1.5, EDW 61.7kg, 2K, 2Ca, AVF 15g Heparin 3000 unit bolus  Calcitriol 1.18mcg PO q HD   Assessment/Plan  Left femoral neck fracture: Orthopedic surgery status post 10/28 surgery  per ortho  ESRD:  Dialysis on MWF schedule HD today on schedule.  Right upper arm/AV fistula swelling: IR consulted to evaluate.  Went for F'gram yesterday. No intervention.  Hypertension/volume: No evidence of volume excess by exam. BP controlled. UF as tolerated.   Anemia: HGB 8.3 S/P 1 unit of PRBCs 06/07/2021 now S/P 2 units PRBCs since adm. HGB 7.2 today, rec'd 1 unit PRBCs today. Give Aranesp with HD today.  Follow HGB.   metabolic bone disease: Ca+/Po4 at goal. continue calcitriol and binders.   Nutrition: ALB 2.4  protein supplement to diet, renal/carb modified CAD/a.fib/hx of LV thrombus: cardiology consulted  cleared for hip surgery . 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. Mikka Kissner NP-C 06/09/2021, 2:40 PM  [employer] [phone]

## 2021-06-09 NOTE — Progress Notes (Signed)
Mobility Specialist Progress Note   06/09/21 1045  Mobility  Activity Sat and stood x 3;Dangled on edge of bed  Level of Assistance Maximum assist, patient does 25-49% (+2)  Assistive Device Stedy  LLE Weight Bearing WBAT  Minutes Stood 3 minutes  Mobility Out of bed for toileting  Mobility Response Tolerated poorly  Mobility performed by Mobility specialist (OT)  $Mobility charge 1 Mobility   Received pt in bed slightly lethargic and unsure about symptoms. Encouraged pt to practice transfer to [facility] w/ OT. MaxA for bed mobility, minA while sitting EOB for short time, mod - maxA for donning of prosthesis. X3 STS to assisted for unexpected BM and pericare. Returned back to bed d/t reported inc in pain. Call bell left by side and NT notified for further assistance in pericare.  Refer to OT note for in-depth session overview    Holland Falling Mobility Specialist Phone Number [phone]

## 2021-06-09 NOTE — Progress Notes (Signed)
Occupational Therapy Treatment Patient Details Name: Jonathan HORNSTEIN Sr. MRN: [id number] DOB: 1950/12/11 Today's Date: 06/09/2021   History of present illness 70 y.o. male with past medical history of chronic systolic congestive heart failure, coronary artery disease, peripheral vascular disease, diabetes mellitus, end-stage renal disease, hypertension, hyperlipidemia, history of LV mural thrombus, prior CVA, paroxysmal H fibrillation, dementia admitted with hip fracture.  S/p left hip hemiarthroplast 06/04/21.   OT comments  Pt is slowly progressing towards OT goals. Remains limited by pain, balance, activity tolerance, cognition, and hip precautions. During session, pt completed LB dressing with Max A, toilet hygiene with Total A, and simulated toilet transfer with Mod A +2. Pt initially declining OOB, however agreed with encouragement. Remains appropriate for CIR at d/c. Will continue to follow acutely.   Recommendations for follow up therapy are one component of a multi-disciplinary discharge planning process, led by the attending physician.  Recommendations may be updated based on patient status, additional functional criteria and insurance authorization.    Follow Up Recommendations  Acute inpatient rehab (3hours/day)    Assistance Recommended at Discharge Intermittent Supervision/Assistance  Equipment Recommendations  Other (comment) (TBD)    Recommendations for Other Services Rehab consult    Precautions / Restrictions Precautions Precautions: Posterior Hip Precaution Booklet Issued: Yes (comment) Required Braces or Orthoses: Other Brace Other Brace: right prosthesis Restrictions Weight Bearing Restrictions: Yes LLE Weight Bearing: Weight bearing as tolerated Other Position/Activity Restrictions: Posterior hip precautions       Mobility Bed Mobility Overal bed mobility: Needs Assistance Bed Mobility: Supine to Sit     Supine to sit: Mod assist;+2 for physical  assistance;HOB elevated Sit to supine: Mod assist;+2 for physical assistance        Transfers Overall transfer level: Needs assistance Equipment used: Ambulation equipment used Transfers: Sit to/from Stand Sit to Stand: Mod assist;+2 physical assistance;+2 safety/equipment;From elevated surface           General transfer comment: Stood to [employer] x3 with Mod A +2 and cues for technique/safety. Maintained standing for approx. 1 min increments with encouragement for pericare. Transfer via Lift Equipment: Stedy   Balance Overall balance assessment: Needs assistance Sitting-balance support: Feet supported;Single extremity supported Sitting balance-Leahy Scale: Poor     Standing balance support: During functional activity Standing balance-Leahy Scale: Poor                             ADL either performed or assessed with clinical judgement   ADL Overall ADL's : Needs assistance/impaired                     Lower Body Dressing: Maximal assistance;Sitting/lateral leans Lower Body Dressing Details (indicate cue type and reason): Able to don/doff sock on residual limb with Min A, extended time and cues for initiation and efficiency. Required support to maintain sitting balance while using both hands during task. Likely still requiring Max A overall with LB dressing. Toilet Transfer: Moderate assistance;+2 for physical assistance;+2 for safety/equipment;BSC;Cueing for safety;Cueing for sequencing Charlaine Dalton) Toilet Transfer Details (indicate cue type and reason): Based on performance with sit to stand with [employer]- Clothing Manipulation and Hygiene: Total assistance;Sit to/from stand;+2 for physical assistance;+2 for safety/equipment;Cueing for safety;Cueing for sequencing Charlaine Dalton)         General ADL Comments: Remians limited by pain, balance, activity toelrance, and cognition.     Vision       Perception     Praxis  Cognition Arousal/Alertness:  Awake/alert Behavior During Therapy: Flat affect Overall Cognitive Status: History of cognitive impairments - at baseline                                 General Comments: Slow processing, requiring muliple cues to follow basic commands          Exercises     Shoulder Instructions       General Comments      Pertinent Vitals/ Pain       Pain Assessment: Faces Faces Pain Scale: Hurts even more Pain Location: Lt hip Pain Descriptors / Indicators: Operative site guarding;Grimacing;Guarding Pain Intervention(s): Limited activity within patient's tolerance;Monitored during session;Repositioned  Home Living                                          Prior Functioning/Environment              Frequency  Min 2X/week        Progress Toward Goals  OT Goals(current goals can now be found in the care plan section)  Progress towards OT goals: Progressing toward goals  Acute Rehab OT Goals Patient Stated Goal: rehab OT Goal Formulation: With family Time For Goal Achievement: 06/19/21 Potential to Achieve Goals: Good ADL Goals Pt Will Perform Lower Body Bathing: with mod assist;sit to/from stand Pt Will Perform Lower Body Dressing: with mod assist;sit to/from stand Pt Will Transfer to Toilet: with min assist;stand pivot transfer;bedside commode Pt Will Perform Toileting - Clothing Manipulation and hygiene: with min assist;sit to/from stand Additional ADL Goal #1: Pt will complete bed mobility at mod A level to prepare for EOB/OOB ADLs.  Plan Discharge plan remains appropriate;Frequency remains appropriate    Co-evaluation                 AM-PAC OT "6 Clicks" Daily Activity     Outcome Measure   Help from another person eating meals?: A Little Help from another person taking care of personal grooming?: A Little Help from another person toileting, which includes using toliet, bedpan, or urinal?: Total Help from another person  bathing (including washing, rinsing, drying)?: A Lot Help from another person to put on and taking off regular upper body clothing?: A Little Help from another person to put on and taking off regular lower body clothing?: A Lot 6 Click Score: 14    End of Session Equipment Utilized During Treatment: Gait belt;Other (comment) (stedy)  OT Visit Diagnosis: Unsteadiness on feet (R26.81);Muscle weakness (generalized) (M62.81);Pain;Other abnormalities of gait and mobility (R26.89);History of falling (Z91.81);Other symptoms and signs involving cognitive function Pain - Right/Left: Left Pain - part of body: Hip   Activity Tolerance Patient limited by pain   Patient Left in bed;with call bell/phone within reach;with nursing/sitter in room   Nurse Communication Mobility status        Time: 4097-3532 OT Time Calculation (min): 31 min  Charges: OT General Charges $OT Visit: 1 Visit OT Treatments $Self Care/Home Management : 8-22 mins $Therapeutic Activity: 8-22 mins  Mackson Botz C, OT/L  [facility] [place] 06/09/2021, 12:11 PM

## 2021-06-09 NOTE — Plan of Care (Signed)

## 2021-06-10 ENCOUNTER — Encounter: Payer: Medicare Other | Admitting: Physical Therapy

## 2021-06-10 ENCOUNTER — Inpatient Hospital Stay (HOSPITAL_COMMUNITY)
Admission: RE | Admit: 2021-06-10 | Discharge: 2021-06-25 | DRG: 559 | Disposition: A | Discharge status: Home / Self Care | Payer: Medicare Other | Source: Intra-hospital | Attending: Physical Medicine and Rehabilitation | Admitting: Physical Medicine and Rehabilitation

## 2021-06-10 ENCOUNTER — Encounter (HOSPITAL_COMMUNITY): Payer: Self-pay | Admitting: Physical Medicine & Rehabilitation

## 2021-06-10 DIAGNOSIS — S7222XS Displaced subtrochanteric fracture of left femur, sequela: Secondary | ICD-10-CM | POA: Diagnosis not present

## 2021-06-10 DIAGNOSIS — I132 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 kidney disease, or end stage renal disease: Secondary | ICD-10-CM | POA: Diagnosis present

## 2021-06-10 DIAGNOSIS — Z7982 Long term (current) use of aspirin: Secondary | ICD-10-CM

## 2021-06-10 DIAGNOSIS — G4720 Circadian rhythm sleep disorder, unspecified type: Secondary | ICD-10-CM

## 2021-06-10 DIAGNOSIS — F01518 Vascular dementia, unspecified severity, with other behavioral disturbance: Secondary | ICD-10-CM | POA: Diagnosis present

## 2021-06-10 DIAGNOSIS — I5022 Chronic systolic (congestive) heart failure: Secondary | ICD-10-CM | POA: Diagnosis present

## 2021-06-10 DIAGNOSIS — T8612 Kidney transplant failure: Secondary | ICD-10-CM | POA: Diagnosis present

## 2021-06-10 DIAGNOSIS — F5104 Psychophysiologic insomnia: Secondary | ICD-10-CM | POA: Diagnosis present

## 2021-06-10 DIAGNOSIS — Z800 Family history of malignant neoplasm of digestive organs: Secondary | ICD-10-CM

## 2021-06-10 DIAGNOSIS — G8918 Other acute postprocedural pain: Secondary | ICD-10-CM

## 2021-06-10 DIAGNOSIS — N2581 Secondary hyperparathyroidism of renal origin: Secondary | ICD-10-CM | POA: Diagnosis present

## 2021-06-10 DIAGNOSIS — F0150 Vascular dementia without behavioral disturbance: Secondary | ICD-10-CM | POA: Diagnosis present

## 2021-06-10 DIAGNOSIS — K625 Hemorrhage of anus and rectum: Secondary | ICD-10-CM

## 2021-06-10 DIAGNOSIS — D631 Anemia in chronic kidney disease: Secondary | ICD-10-CM | POA: Diagnosis present

## 2021-06-10 DIAGNOSIS — K5641 Fecal impaction: Secondary | ICD-10-CM | POA: Diagnosis not present

## 2021-06-10 DIAGNOSIS — Z515 Encounter for palliative care: Secondary | ICD-10-CM | POA: Diagnosis not present

## 2021-06-10 DIAGNOSIS — R197 Diarrhea, unspecified: Secondary | ICD-10-CM

## 2021-06-10 DIAGNOSIS — E1151 Type 2 diabetes mellitus with diabetic peripheral angiopathy without gangrene: Secondary | ICD-10-CM | POA: Diagnosis present

## 2021-06-10 DIAGNOSIS — N189 Chronic kidney disease, unspecified: Secondary | ICD-10-CM | POA: Diagnosis present

## 2021-06-10 DIAGNOSIS — K521 Toxic gastroenteritis and colitis: Secondary | ICD-10-CM | POA: Diagnosis not present

## 2021-06-10 DIAGNOSIS — F518 Other sleep disorders not due to a substance or known physiological condition: Secondary | ICD-10-CM

## 2021-06-10 DIAGNOSIS — E1122 Type 2 diabetes mellitus with diabetic chronic kidney disease: Secondary | ICD-10-CM | POA: Diagnosis present

## 2021-06-10 DIAGNOSIS — I480 Paroxysmal atrial fibrillation: Secondary | ICD-10-CM | POA: Diagnosis present

## 2021-06-10 DIAGNOSIS — I2510 Atherosclerotic heart disease of native coronary artery without angina pectoris: Secondary | ICD-10-CM | POA: Diagnosis present

## 2021-06-10 DIAGNOSIS — Z87891 Personal history of nicotine dependence: Secondary | ICD-10-CM

## 2021-06-10 DIAGNOSIS — Z7189 Other specified counseling: Secondary | ICD-10-CM | POA: Diagnosis not present

## 2021-06-10 DIAGNOSIS — Z7952 Long term (current) use of systemic steroids: Secondary | ICD-10-CM

## 2021-06-10 DIAGNOSIS — Z833 Family history of diabetes mellitus: Secondary | ICD-10-CM

## 2021-06-10 DIAGNOSIS — Z794 Long term (current) use of insulin: Secondary | ICD-10-CM

## 2021-06-10 DIAGNOSIS — Z992 Dependence on renal dialysis: Secondary | ICD-10-CM | POA: Diagnosis not present

## 2021-06-10 DIAGNOSIS — K5900 Constipation, unspecified: Secondary | ICD-10-CM

## 2021-06-10 DIAGNOSIS — M898X9 Other specified disorders of bone, unspecified site: Secondary | ICD-10-CM | POA: Diagnosis present

## 2021-06-10 DIAGNOSIS — Z8673 Personal history of transient ischemic attack (TIA), and cerebral infarction without residual deficits: Secondary | ICD-10-CM | POA: Diagnosis not present

## 2021-06-10 DIAGNOSIS — N186 End stage renal disease: Secondary | ICD-10-CM | POA: Diagnosis present

## 2021-06-10 DIAGNOSIS — Z96642 Presence of left artificial hip joint: Secondary | ICD-10-CM | POA: Diagnosis present

## 2021-06-10 DIAGNOSIS — Z7901 Long term (current) use of anticoagulants: Secondary | ICD-10-CM

## 2021-06-10 DIAGNOSIS — Z79899 Other long term (current) drug therapy: Secondary | ICD-10-CM

## 2021-06-10 DIAGNOSIS — Z89511 Acquired absence of right leg below knee: Secondary | ICD-10-CM

## 2021-06-10 DIAGNOSIS — Y830 Surgical operation with transplant of whole organ as the cause of abnormal reaction of the patient, or of later complication, without mention of misadventure at the time of the procedure: Secondary | ICD-10-CM | POA: Diagnosis present

## 2021-06-10 DIAGNOSIS — R34 Anuria and oliguria: Secondary | ICD-10-CM | POA: Diagnosis present

## 2021-06-10 DIAGNOSIS — E785 Hyperlipidemia, unspecified: Secondary | ICD-10-CM | POA: Diagnosis present

## 2021-06-10 DIAGNOSIS — S7222XD Displaced subtrochanteric fracture of left femur, subsequent encounter for closed fracture with routine healing: Principal | ICD-10-CM

## 2021-06-10 DIAGNOSIS — T3695XA Adverse effect of unspecified systemic antibiotic, initial encounter: Secondary | ICD-10-CM | POA: Diagnosis not present

## 2021-06-10 DIAGNOSIS — D62 Acute posthemorrhagic anemia: Secondary | ICD-10-CM | POA: Diagnosis present

## 2021-06-10 DIAGNOSIS — W19XXXD Unspecified fall, subsequent encounter: Secondary | ICD-10-CM | POA: Diagnosis present

## 2021-06-10 DIAGNOSIS — F0153 Vascular dementia, unspecified severity, with mood disturbance: Secondary | ICD-10-CM | POA: Diagnosis present

## 2021-06-10 DIAGNOSIS — Z8619 Personal history of other infectious and parasitic diseases: Secondary | ICD-10-CM

## 2021-06-10 LAB — TYPE AND SCREEN
ABO/RH(D): O POS
Antibody Screen: NEGATIVE
Unit division: 0
Unit division: 0

## 2021-06-10 LAB — BPAM RBC
Blood Product Expiration Date: 202211052359
Blood Product Expiration Date: 202211212359
ISSUE DATE / TIME: 202210312004
ISSUE DATE / TIME: 202211021116
Unit Type and Rh: 5100
Unit Type and Rh: 5100

## 2021-06-10 LAB — GLUCOSE, CAPILLARY
Glucose-Capillary: 123 mg/dL — ABNORMAL HIGH (ref 70–99)
Glucose-Capillary: 136 mg/dL — ABNORMAL HIGH (ref 70–99)
Glucose-Capillary: 240 mg/dL — ABNORMAL HIGH (ref 70–99)
Glucose-Capillary: 255 mg/dL — ABNORMAL HIGH (ref 70–99)
Glucose-Capillary: 80 mg/dL (ref 70–99)
Glucose-Capillary: 88 mg/dL (ref 70–99)

## 2021-06-10 MED ORDER — ACETAMINOPHEN 325 MG PO TABS
325.0000 mg | ORAL_TABLET | ORAL | Status: DC | PRN
  Administered 2021-06-17 – 2021-06-22 (×8): 650 mg via ORAL
  Filled 2021-06-10 (×9): qty 2

## 2021-06-10 MED ORDER — CEFADROXIL 500 MG PO CAPS: 500.0000 mg | ORAL_CAPSULE | ORAL | Status: DC

## 2021-06-10 MED ORDER — INSULIN ASPART 100 UNIT/ML IJ SOLN
0.0000 [IU] | INTRAMUSCULAR | 11 refills | Status: DC
Start: 2021-06-10 — End: 2021-06-22

## 2021-06-10 MED ORDER — RENA-VITE PO TABS
1.0000 | ORAL_TABLET | Freq: Every day | ORAL | Status: DC
  Administered 2021-06-10 – 2021-06-24 (×15): 1 via ORAL
  Filled 2021-06-10 (×15): qty 1

## 2021-06-10 MED ORDER — NYSTATIN 100000 UNIT/GM EX CREA
TOPICAL_CREAM | Freq: Two times a day (BID) | CUTANEOUS | Status: DC
  Administered 2021-06-14 – 2021-06-15 (×2): 1 via TOPICAL
  Filled 2021-06-10: qty 15

## 2021-06-10 MED ORDER — DIPHENHYDRAMINE HCL 12.5 MG/5ML PO ELIX: 12.5000 mg | ORAL_SOLUTION | Freq: Four times a day (QID) | ORAL | Status: DC | PRN

## 2021-06-10 MED ORDER — PROCHLORPERAZINE EDISYLATE 10 MG/2ML IJ SOLN
5.0000 mg | Freq: Four times a day (QID) | INTRAMUSCULAR | Status: DC | PRN
Start: 2021-06-10 — End: 2021-06-25

## 2021-06-10 MED ORDER — HYDROCODONE-ACETAMINOPHEN 5-325 MG PO TABS
1.0000 | ORAL_TABLET | Freq: Every day | ORAL | Status: DC
  Administered 2021-06-11 – 2021-06-25 (×15): 1 via ORAL
  Filled 2021-06-10 (×15): qty 1

## 2021-06-10 MED ORDER — PREDNISONE 5 MG PO TABS
5.0000 mg | ORAL_TABLET | Freq: Every day | ORAL | Status: DC
  Administered 2021-06-11 – 2021-06-25 (×15): 5 mg via ORAL
  Filled 2021-06-10 (×15): qty 1

## 2021-06-10 MED ORDER — FOLIC ACID 1 MG PO TABS
1.0000 mg | ORAL_TABLET | Freq: Every day | ORAL | Status: DC
  Administered 2021-06-11 – 2021-06-25 (×15): 1 mg via ORAL
  Filled 2021-06-10 (×15): qty 1

## 2021-06-10 MED ORDER — ALLOPURINOL 100 MG PO TABS
100.0000 mg | ORAL_TABLET | Freq: Every day | ORAL | Status: DC
  Administered 2021-06-10 – 2021-06-24 (×15): 100 mg via ORAL
  Filled 2021-06-10 (×15): qty 1

## 2021-06-10 MED ORDER — CHLORHEXIDINE GLUCONATE CLOTH 2 % EX PADS
6.0000 | MEDICATED_PAD | Freq: Every day | CUTANEOUS | Status: DC
  Administered 2021-06-14 – 2021-06-22 (×8): 6 via TOPICAL

## 2021-06-10 MED ORDER — NYSTATIN 100000 UNIT/GM EX POWD
Freq: Three times a day (TID) | CUTANEOUS | Status: DC
  Administered 2021-06-14 – 2021-06-15 (×3): 1 via TOPICAL
  Filled 2021-06-10: qty 15

## 2021-06-10 MED ORDER — HYDROCODONE-ACETAMINOPHEN 5-325 MG PO TABS: 1.0000 | ORAL_TABLET | Freq: Four times a day (QID) | ORAL | 0 refills | Status: DC | PRN

## 2021-06-10 MED ORDER — ATORVASTATIN CALCIUM 40 MG PO TABS
40.0000 mg | ORAL_TABLET | Freq: Every day | ORAL | Status: DC
  Administered 2021-06-10 – 2021-06-20 (×11): 40 mg via ORAL
  Filled 2021-06-10 (×11): qty 1

## 2021-06-10 MED ORDER — INSULIN ASPART 100 UNIT/ML IJ SOLN
0.0000 [IU] | Freq: Three times a day (TID) | INTRAMUSCULAR | Status: DC
Start: 2021-06-10 — End: 2021-06-10

## 2021-06-10 MED ORDER — POLYETHYLENE GLYCOL 3350 17 G PO PACK: 17.0000 g | PACK | Freq: Every day | ORAL | 0 refills | Status: AC | PRN

## 2021-06-10 MED ORDER — PROCHLORPERAZINE 25 MG RE SUPP: 12.5000 mg | Freq: Four times a day (QID) | RECTAL | Status: DC | PRN

## 2021-06-10 MED ORDER — CALCIUM POLYCARBOPHIL 625 MG PO TABS
1250.0000 mg | ORAL_TABLET | Freq: Every day | ORAL | Status: DC
  Administered 2021-06-10 – 2021-06-22 (×13): 1250 mg via ORAL
  Filled 2021-06-10 (×13): qty 2

## 2021-06-10 MED ORDER — BISACODYL 10 MG RE SUPP: 10.0000 mg | Freq: Every day | RECTAL | Status: DC | PRN

## 2021-06-10 MED ORDER — SEVELAMER CARBONATE 800 MG PO TABS
800.0000 mg | ORAL_TABLET | Freq: Three times a day (TID) | ORAL | Status: DC
  Administered 2021-06-10 – 2021-06-21 (×31): 800 mg via ORAL
  Filled 2021-06-10 (×32): qty 1

## 2021-06-10 MED ORDER — ALUM & MAG HYDROXIDE-SIMETH 200-200-20 MG/5ML PO SUSP: 30.0000 mL | ORAL | Status: DC | PRN

## 2021-06-10 MED ORDER — HYDROCODONE-ACETAMINOPHEN 5-325 MG PO TABS
1.0000 | ORAL_TABLET | Freq: Four times a day (QID) | ORAL | Status: DC | PRN
  Administered 2021-06-11 – 2021-06-15 (×6): 1 via ORAL
  Filled 2021-06-10 (×8): qty 1

## 2021-06-10 MED ORDER — TRAZODONE HCL 50 MG PO TABS: 25.0000 mg | ORAL_TABLET | Freq: Every evening | ORAL | Status: DC | PRN

## 2021-06-10 MED ORDER — DARBEPOETIN ALFA 100 MCG/0.5ML IJ SOSY: 100.0000 ug | PREFILLED_SYRINGE | INTRAMUSCULAR | Status: DC

## 2021-06-10 MED ORDER — PROCHLORPERAZINE MALEATE 5 MG PO TABS
5.0000 mg | ORAL_TABLET | Freq: Four times a day (QID) | ORAL | Status: DC | PRN
Start: 2021-06-10 — End: 2021-06-25

## 2021-06-10 MED ORDER — ASPIRIN 81 MG PO CHEW
81.0000 mg | CHEWABLE_TABLET | Freq: Every day | ORAL | Status: DC
  Administered 2021-06-10 – 2021-06-22 (×13): 81 mg via ORAL
  Filled 2021-06-10 (×14): qty 1

## 2021-06-10 MED ORDER — POLYETHYLENE GLYCOL 3350 17 G PO PACK: 17.0000 g | PACK | Freq: Every day | ORAL | Status: DC | PRN

## 2021-06-10 MED ORDER — NEPRO/CARBSTEADY PO LIQD
237.0000 mL | Freq: Every day | ORAL | Status: DC
  Administered 2021-06-11 – 2021-06-24 (×14): 237 mL via ORAL

## 2021-06-10 MED ORDER — FLEET ENEMA 7-19 GM/118ML RE ENEM: 1.0000 | ENEMA | Freq: Once | RECTAL | Status: DC | PRN

## 2021-06-10 MED ORDER — APIXABAN 5 MG PO TABS
5.0000 mg | ORAL_TABLET | Freq: Two times a day (BID) | ORAL | Status: DC
  Administered 2021-06-10 – 2021-06-22 (×25): 5 mg via ORAL
  Filled 2021-06-10 (×26): qty 1

## 2021-06-10 MED ORDER — GUAIFENESIN-DM 100-10 MG/5ML PO SYRP
5.0000 mL | ORAL_SOLUTION | Freq: Four times a day (QID) | ORAL | Status: DC | PRN
Start: 2021-06-10 — End: 2021-06-25

## 2021-06-10 MED ORDER — GERHARDT'S BUTT CREAM
TOPICAL_CREAM | Freq: Four times a day (QID) | CUTANEOUS | Status: DC
  Administered 2021-06-14 – 2021-06-15 (×6): 1 via TOPICAL
  Filled 2021-06-10: qty 1

## 2021-06-10 MED ORDER — HEPARIN SODIUM (PORCINE) 1000 UNIT/ML DIALYSIS
3000.0000 [IU] | Freq: Once | INTRAMUSCULAR | Status: AC
  Administered 2021-06-11: 3000 [IU] via INTRAVENOUS_CENTRAL
  Filled 2021-06-10 (×2): qty 3

## 2021-06-10 MED ORDER — INSULIN ASPART 100 UNIT/ML IJ SOLN
0.0000 [IU] | Freq: Three times a day (TID) | INTRAMUSCULAR | Status: DC
  Administered 2021-06-10 (×2): 5 [IU] via SUBCUTANEOUS
  Administered 2021-06-11 (×2): 2 [IU] via SUBCUTANEOUS
  Administered 2021-06-11: 5 [IU] via SUBCUTANEOUS
  Administered 2021-06-12 (×2): 2 [IU] via SUBCUTANEOUS
  Administered 2021-06-12 – 2021-06-13 (×2): 1 [IU] via SUBCUTANEOUS
  Administered 2021-06-13 – 2021-06-14 (×3): 2 [IU] via SUBCUTANEOUS
  Administered 2021-06-14 – 2021-06-15 (×3): 1 [IU] via SUBCUTANEOUS
  Administered 2021-06-15: 2 [IU] via SUBCUTANEOUS
  Administered 2021-06-16 (×2): 1 [IU] via SUBCUTANEOUS
  Administered 2021-06-17 – 2021-06-18 (×3): 2 [IU] via SUBCUTANEOUS
  Administered 2021-06-19 – 2021-06-20 (×3): 1 [IU] via SUBCUTANEOUS
  Administered 2021-06-20 (×2): 2 [IU] via SUBCUTANEOUS
  Administered 2021-06-22 – 2021-06-23 (×4): 1 [IU] via SUBCUTANEOUS

## 2021-06-10 NOTE — Progress Notes (Signed)
PROGRESS NOTE  Jonathan [name] Sr.  DOB: 1950-12-26  PCP: Ginger Organ., MD LOV:[id number]  DOA: 06/03/2021 Hospital Day: 8  Chief Complaint  Patient presents with   Hip Pain    Brief narrative: Jonathan Siefring. is a 70 y.o. 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 Patient presented to the ED from home on 10/27 after mechanical fall due to prosthetic leg malfunction while standing up.  Imaging in the ED showed left femoral neck fracture with mild displacement.   Admitted to hospitalist service Ortho surgery was consulted and performed fixation 10/28.  Currently pending CIR.  Subjective: Patient was seen and examined on the morning of 11/2.  Lying on bed.  Not in distress.  No new symptoms..  Wife at bedside  Assessment/Plan: Left femoral neck fracture with mild displacement -s/p fixation 10/28. -Ortho following, appreciate recommendations             -Cefadroxil (10/29-11/5)             -Eliquis resumed at full dose 5 mg twice daily -Follow-up outpatient Ortho in 2 weeks postop for wound check -Pending CIR discharge -Continue PT/OT  ESRD MWF HD Failed renal transplant -Management per nephrology.  For dialysis today 11/2. -continue home prednisone for renal transplant -Continue home Renvela  Acute on chronic anemia -Chronic anemia with hemoglobin mostly between 8 and 9.  Hemoglobin low at 7.2 on 11/2.  Plan to transfuse monitor PRBC with dialysis. Recent Labs    08/07/20 0306 08/07/20 0532 08/07/20 1453 11/04/20 0401 11/05/20 1104 05/04/21 1145 06/03/21 1527 06/06/21 1426 06/07/21 0216 06/07/21 0748 06/08/21 0219 06/09/21 0217  HGB 7.8*  --    < > 8.1*   < >  --    < > 7.5* 7.2* 7.1* 8.3* 7.2*  MCV 103.3*  --    < > 92.0   < >  --    < >  --  94.0 94.3 89.1 90.2  VITAMINB12  --  612  --  488  --   1,326*  --   --   --   --   --   --   FOLATE  --  18.5  --  46.9  --   --   --   --   --   --   --   --   FERRITIN  --  1,009*  --   --   --   --   --   --   --   --   --   --   TIBC  --  172*  --   --   --   --   --   --   --   --   --   --   IRON  --  47  --   --   --   --   --   --   --   --   --   --   RETICCTPCT 1.6  --   --   --   --   --   --   --   --   --   --   --    < > = values in this interval not displayed.    HFrEF -EF 30 to 35% Hypertension  -Patient does not seem  to be on any CHF meds in his home list, presumably due to hypotension at dialysis. -Need to be addressed as an outpatient.  Prior LV thrombus  -On Eliquis  CAD/MI PAD s/p right BKA Vascular dementia, HLD -Continue Eliquis, statin    Type 2 diabetes mellitus -Diet controlled.  A1c 5.3 on 10/28 -Currently on sliding scale insulin -Blood sugar level controlled  Chronic hepatitis C  History of gout -no acute flareups -Continue home allopurinol  Mobility: Pending CIR Living condition:  Goals of care:   Code Status: Full Code  Nutritional status: Body mass index is 20.58 kg/m. Nutrition Problem: Increased nutrient needs Etiology: post-op healing Signs/Symptoms: estimated needs Diet:  Diet Order             Diet Carb Modified Fluid consistency: Thin; Room service appropriate? Yes  Diet effective now                  DVT prophylaxis:   apixaban (ELIQUIS) tablet 5 mg   Antimicrobials: None Fluid: None Consultants: Nephrology Family Communication: Wife at bedside  Status is: Inpatient  Remains inpatient appropriate because: Pending CIR  Dispo: The patient is from: Home              Anticipated d/c is to: CIR              Patient currently is not medically stable to d/c.   Difficult to place patient No     Infusions:    Scheduled Meds:  sodium chloride   Intravenous Once   allopurinol  100 mg Oral QHS   apixaban  5 mg Oral BID   atorvastatin  40 mg Oral QHS   cefadroxil   500 mg Oral Q36H   Chlorhexidine Gluconate Cloth  6 each Topical Q0600   darbepoetin (ARANESP) injection - DIALYSIS  100 mcg Intravenous Q Wed-HD   feeding supplement  237 mL Oral TID BM   feeding supplement (NEPRO CARB STEADY)  237 mL Oral BID BM   folic acid  1 mg Oral Daily   insulin aspart  0-9 Units Subcutaneous Q4H   multivitamin  1 tablet Oral QHS   predniSONE  5 mg Oral Q breakfast   sevelamer carbonate  800 mg Oral TID WC    PRN meds: HYDROcodone-acetaminophen, HYDROmorphone (DILAUDID) injection, polyethylene glycol   Antimicrobials: Anti-infectives (From admission, onward)    Start     Dose/Rate Route Frequency Ordered Stop   06/07/21 1700  cefadroxil (DURICEF) capsule 500 mg        500 mg Oral Every 36 hours 06/07/21 1521 06/11/21 1959   06/07/21 1000  cefadroxil (DURICEF) capsule 500 mg  Status:  Discontinued        500 mg Oral Every 36 hours 06/05/21 2252 06/07/21 1521   06/05/21 2345  cefadroxil (DURICEF) capsule 500 mg  Status:  Discontinued        500 mg Oral  Once 06/05/21 2252 06/07/21 1521   06/05/21 2100  cefadroxil (DURICEF) capsule 500 mg  Status:  Discontinued        500 mg Oral 2 times daily 06/05/21 0603 06/05/21 2252   06/05/21 0800  ceFAZolin (ANCEF) IVPB 2g/100 mL premix        2 g 200 mL/hr over 30 Minutes Intravenous  Once 06/04/21 1510 06/05/21 1029   06/04/21 0945  ceFAZolin (ANCEF) IVPB 2g/100 mL premix        2 g 200 mL/hr over 30 Minutes Intravenous On call to  O.R. 06/04/21 0623 06/04/21 1119   06/04/21 0935  ceFAZolin (ANCEF) 2-4 GM/100ML-% IVPB  Status:  Discontinued       Note to Pharmacy: Jonathan [name]   : cabinet override      06/04/21 0935 06/04/21 0939   06/04/21 0846  ceFAZolin (ANCEF) 2-4 GM/100ML-% IVPB       Note to Pharmacy: Jonathan [name]   : cabinet override      06/04/21 0846 06/04/21 1128   06/03/21 1715  cefTRIAXone (ROCEPHIN) 1 g in sodium chloride 0.9 % 100 mL IVPB  Status:  Discontinued        1 g 200 mL/hr over 30  Minutes Intravenous  Once 06/03/21 1712 06/03/21 1805   06/03/21 1715  azithromycin (ZITHROMAX) 500 mg in sodium chloride 0.9 % 250 mL IVPB  Status:  Discontinued        500 mg 250 mL/hr over 60 Minutes Intravenous  Once 06/03/21 1712 06/03/21 1805       Objective:  Filed Weights   06/07/21 1723 06/07/21 2046 06/09/21 1152  Weight: 64.5 kg 62 kg 63.2 kg   Weight change:  Body mass index is 20.58 kg/m.   Physical Exam: General exam: Pleasant, elderly African-American male.  Not in physical distress Skin: No rashes, lesions or ulcers. HEENT: Atraumatic, normocephalic, no obvious bleeding Lungs: Clear to auscultation bilaterally CVS: Regular rate and rhythm, no murmur GI/Abd soft, nontender, nondistended, bowel sound present CNS: Alert, awake, oriented x3 Psychiatry: Mood appropriate Extremities: Right BKA status.  Left without edema or tenderness  Data Review: I have personally reviewed the laboratory data and studies available.  F/u labs ordered Unresulted Labs (From admission, onward)    None       Signed, Terrilee Croak, MD [facility] 06/09/2021

## 2021-06-10 NOTE — Progress Notes (Signed)
IP rehab admissions - I have a bed on inpatient rehab today for this patient.  I have clearance from attending MD for admission to CIR today.  Will admit to CIR today.  Call for questions.  [phone]

## 2021-06-10 NOTE — H&P (Signed)
Physical Medicine and Rehabilitation Admission H&P        Chief Complaint  Patient presents with   Functional decline post left hip hemi      HPI:  Jonathan [name] is a 70 year old male with history of T2DM, CAD, ICM w/chronic systolic CHF, ESRD s/p failed renal transplant -HD MWF, recurrent LV thrombus, PAF, CVA, vascular dementia, R-BKA who was admitted on 06/03/21 after falling while attempting to use his prosthesis. He had immediate onset of pain with difficulty walking due to left displaced femoral neck fracture. He was evaluated by Dr. Zachery Dakins, cleared for surgery by Dr. Marcelle Smiling and underwent left hip hemi on 06/04/21. Post op WBAT with hip precautions, was bridged with heparin and slowly transitioned back to Eliquis 5 mg bid.    Cefadroxil bid X 7 days recommended by ortho due to given multiple comorbidities and increased risk for post op infection.  He did have drop in Hgb to 6.4 on 10/30 and was transfused with one unit PRBC.  IR consulted for fistula evaluation due to ongoing issues with RUE swelling of few weeks duration and fistulagram performed without intervention on 10/31. He continues to have limitation in mobility and ADLs due to pian, decreased    Per family member, pt was using his R BKA prosthesis to walk at home for 2 weeks prior to fall.  Is oliguric- pees ~ 1x/day.  Also having a lot of diarrhea and groin/penis red.  Per pt, pain is "ok"- esp at rest.  Per wife, also pockets food and needs D2 diet.    Review of Systems  Unable to perform ROS: Mental acuity  Gastrointestinal:  Positive for diarrhea.  Musculoskeletal:  Positive for joint pain.  Neurological:  Positive for weakness.  Psychiatric/Behavioral:  Positive for memory loss. The patient has insomnia.           Past Medical History:  Diagnosis Date   CAD (coronary artery disease)     CHF (congestive heart failure) ([name])     Diabetes mellitus without complication ([place])      type 2   DM (diabetes  mellitus) ([place])     ESRD (end stage renal disease) ([place])     FUO (fever of unknown origin) 09/17/2019   Hyperlipidemia     Hypertension     Osteomyelitis ([place])     Peripheral arterial disease ([name])     PVD (peripheral vascular disease) ([place])     Renal disorder 2015    right kidney transplant   Stroke [facility])     Vascular dementia [facility])             Past Surgical History:  Procedure Laterality Date   ABDOMINAL AORTOGRAM N/A 08/05/2020    Procedure: ABDOMINAL AORTOGRAM;  Surgeon: [name], MD;  Location: [place] [facility];  Service: Cardiovascular;  Laterality: N/A;   ABDOMINAL AORTOGRAM W/LOWER EXTREMITY N/A 09/09/2020    Procedure: ABDOMINAL AORTOGRAM W/LOWER EXTREMITY;  Surgeon: [name], MD;  Location: [place] [facility];  Service: Cardiovascular;  Laterality: N/A;   AMPUTATION Right 08/06/2020    Procedure: Right Fifth Toe Ray Amputation;  Surgeon: [name], MD;  Location: [place];  Service: Vascular;  Laterality: Right;   AMPUTATION Right 11/02/2020    Procedure: RIGHT AMPUTATION BELOW KNEE;  Surgeon: [name], MD;  Location: [facility] OR;  Service: Vascular;  Laterality: Right;   BACK SURGERY        Has had 2  back surgeries   BUBBLE STUDY   09/18/2019    Procedure: BUBBLE STUDY;  Surgeon: Buford Dresser, MD;  Location: [place];  Service: Cardiovascular;;   Depression       ENDARTERECTOMY FEMORAL Right 08/06/2020    Procedure: Right Ilio- Femoral Artery Endarterectomy;  Surgeon: [name], MD;  Location: [facility] OR;  Service: Vascular;  Laterality: Right;   HAND SURGERY Right     HAND SURGERY       HIP ARTHROPLASTY Left 06/04/2021    Procedure: ARTHROPLASTY BIPOLAR HIP (HEMIARTHROPLASTY);  Surgeon: Willaim Sheng, MD;  Location: [place];  Service: Orthopedics;  Laterality: Left;   IR DIALY SHUNT INTRO NEEDLE/INTRACATH INITIAL W/IMG RIGHT Right 06/07/2021   IR REMOVAL TUN CV CATH W/O FL   09/03/2019   KIDNEY TRANSPLANT        November  2015   LEG AMPUTATION BELOW KNEE Right     LOWER EXTREMITY ANGIOGRAPHY Bilateral 08/05/2020    Procedure: LOWER EXTREMITY ANGIOGRAPHY;  Surgeon: [name], MD;  Location: [place] [facility];  Service: Cardiovascular;  Laterality: Bilateral;   Memory loss       NEPHRECTOMY TRANSPLANTED ORGAN       PATCH ANGIOPLASTY Right 08/06/2020    Procedure: PATCH ANGIOPLASTY;  Surgeon: [name], MD;  Location: [facility] OR;  Service: Vascular;  Laterality: Right;   PERIPHERAL VASCULAR BALLOON ANGIOPLASTY Right 09/09/2020    Procedure: PERIPHERAL VASCULAR BALLOON ANGIOPLASTY;  Surgeon: [name], MD;  Location: [place] [facility];  Service: Cardiovascular;  Laterality: Right;  popliteal   PERIPHERAL VASCULAR INTERVENTION Right 08/05/2020    Procedure: PERIPHERAL VASCULAR INTERVENTION;  Surgeon: [name], MD;  Location: [place] [facility];  Service: Cardiovascular;  Laterality: Right;  SFA   PERIPHERAL VASCULAR INTERVENTION Right 09/09/2020    Procedure: PERIPHERAL VASCULAR INTERVENTION;  Surgeon: [name], MD;  Location: [place] [facility];  Service: Cardiovascular;  Laterality: Right;  external iliac   TEE WITHOUT CARDIOVERSION N/A 09/18/2019    Procedure: TRANSESOPHAGEAL ECHOCARDIOGRAM (TEE);  Surgeon: Buford Dresser, MD;  Location: [facility] ENDOSCOPY;  Service: Cardiovascular;  Laterality: N/A;   WOUND DEBRIDEMENT Right 10/08/2020    Procedure: DEBRIDEMENT WOUND RIGHT FOOT;  Surgeon: [name], MD;  Location: [facility] OR;  Service: Vascular;  Laterality: Right;           Family History  Problem Relation Age of Onset   Diabetes Mellitus II Mother     Cancer Mother          STOMACH   Diabetes Mellitus II Brother     Other Father          UNKOWN   Healthy Daughter     Diabetes Son        Social History:  Married and independent PTA. He used to work for [employer] in [place]. Wife works nights. Per reports that he has quit smoking. He has never used  smokeless tobacco. Per reports that he does not drink alcohol and does not use drugs.     Allergies: No Known Allergies           Medications Prior to Admission  Medication Sig Dispense Refill   allopurinol (ZYLOPRIM) 100 MG tablet Take 100 mg by mouth at bedtime.       apixaban (ELIQUIS) 5 MG TABS tablet Take 5 mg by mouth 2 (two) times daily.       aspirin 81 MG chewable tablet Chew 81 mg by  mouth daily.        atorvastatin (LIPITOR) 40 MG tablet Take 40 mg by mouth at bedtime.       b complex-vitamin c-folic acid (NEPHRO-VITE) 0.8 MG TABS tablet Take 1 tablet by mouth daily.       feeding supplement (BOOST HIGH PROTEIN) LIQD Take 1 Container by mouth daily as needed (nutrition).       folic acid (FOLVITE) 1 MG tablet Take 1 tablet (1 mg total) by mouth daily. 30 tablet 3   insulin aspart (NOVOLOG) 100 UNIT/ML injection Insulin sliding scale: Blood sugar  120-150   3units                       151-200   4units                       201-250   7units                       251- 300  11units                       301-350   15uints                       351-400   20units                       >400         call MD immediately 10 mL 0   Insulin Pen Needle 32G X 4 MM MISC 1 each by Other route as needed (insulin).        levalbuterol (XOPENEX HFA) 45 MCG/ACT inhaler Inhale 2 puffs into the lungs every 8 (eight) hours as needed for wheezing. 1 Inhaler 12   lidocaine-prilocaine (EMLA) cream Apply 1 application topically 3 (three) times a week. 30 minutes prior to Dialysis - MWF   11   oxyCODONE-acetaminophen (PERCOCET/ROXICET) 5-325 MG tablet Take 1 tablet by mouth every 6 (six) hours as needed for moderate pain. 5 tablet 0   predniSONE (DELTASONE) 5 MG tablet Take 5 mg by mouth daily with breakfast.       senna-docusate (SENOKOT-S) 8.6-50 MG tablet Take 1 tablet by mouth 2 (two) times daily. Hold if diarrhea (Patient taking differently: Take 1 tablet by mouth 2 (two) times daily as needed for  mild constipation.)       sevelamer carbonate (RENVELA) 800 MG tablet Take 800 mg by mouth 3 (three) times daily with meals.       donepezil (ARICEPT) 10 MG tablet Take 1 tablet (10 mg total) by mouth at bedtime. Start half a tablet daily for 4 weeks to be increased to 1 tablet daily if tolerated 30 tablet 3      Drug Regimen Review  Drug regimen was reviewed and remains appropriate with no significant issues identified   Home: Home Living Family/patient expects to be discharged to:: Private residence Living Arrangements: Spouse/significant other, Children, Other relatives Available Help at Discharge: Family, Available PRN/intermittently (wife works) Type of Home: House Home Access: Stairs to enter Technical brewer of Steps: 1 Entrance Stairs-Rails: None Home Layout: Two level, Bed/bath upstairs, 1/2 bath on main level, Able to live on main level with bedroom/bathroom Alternate Level Stairs-Number of Steps: 12 Alternate Level Stairs-Rails: Right Bathroom Shower/Tub: Chiropodist: Standard Home Equipment: Conservation officer, nature (2 wheels), Rollator (4 wheels),  Cane - single point, Wheelchair - manual Additional Comments: history provided by wife - [employer] MWF through [employer] bus   Functional History: Prior Function Prior Level of Function : Needs assist  Cognitive Assist : Mobility (cognitive) Mobility (Cognitive): Step by step cues ADLs (Cognitive): Step by step cues Physical Assist : Mobility (physical), ADLs (physical) Mobility (physical): Bed mobility, Transfers, Gait, Stairs ADLs Comments: spouse reports she bathes and dresses pt   Functional Status:  Mobility: Bed Mobility Overal bed mobility: Needs Assistance Bed Mobility: Supine to Sit Supine to sit: Mod assist, +2 for physical assistance, HOB elevated Sit to supine: Mod assist, +2 for physical assistance General bed mobility comments: Assist with LLE and trunk to get to EOB with cues for technique and  sequencing, posterior bias. Assist with scooting bottom to EOB. Step by step cues needed, difficulty sequencing. Transfers Overall transfer level: Needs assistance Equipment used: Ambulation equipment used Transfer via Lift Equipment: Stedy Transfers: Sit to/from Stand Sit to Stand: Mod assist, +2 physical assistance, +2 safety/equipment, From elevated surface Stand pivot transfers: Total assist General transfer comment: Stood to [employer] x3 with Mod A +2 and cues for technique/safety. Maintained standing for approx. 1 min increments with encouragement for pericare. Ambulation/Gait General Gait Details: UNable   ADL: ADL Overall ADL's : Needs assistance/impaired Eating/Feeding: Minimal assistance Grooming: Maximal assistance Upper Body Bathing: Maximal assistance Lower Body Bathing: Maximal assistance, +2 for physical assistance, Sit to/from stand Upper Body Dressing : Maximal assistance Lower Body Dressing: Maximal assistance, Sitting/lateral leans Lower Body Dressing Details (indicate cue type and reason): Able to don/doff sock on residual limb with Min A, extended time and cues for initiation and efficiency. Required support to maintain sitting balance while using both hands during task. Likely still requiring Max A overall with LB dressing. Toilet Transfer: Moderate assistance, +2 for physical assistance, +2 for safety/equipment, BSC, Cueing for safety, Cueing for sequencing Charlaine Dalton) Toilet Transfer Details (indicate cue type and reason): Based on performance with sit to stand with [employer]- Water quality scientist and Hygiene: Total assistance, Sit to/from stand, +2 for physical assistance, +2 for safety/equipment, Cueing for safety, Cueing for sequencing Charlaine Dalton) General ADL Comments: Remians limited by pain, balance, activity toelrance, and cognition.   Cognition: Cognition Overall Cognitive Status: History of cognitive impairments - at baseline Orientation Level: Oriented  X4 Cognition Arousal/Alertness: Awake/alert Behavior During Therapy: Flat affect Overall Cognitive Status: History of cognitive impairments - at baseline General Comments: Slow processing, requiring muliple cues to follow basic commands     Blood pressure 137/64, pulse 99, temperature 98.8 F (37.1 C), temperature source Oral, resp. rate 16, height 5\' 9"  (1.753 m), weight 63.2 kg, SpO2 100 %. Physical Exam Vitals and nursing note reviewed. Exam conducted with a chaperone present.  Constitutional:      Appearance: Normal appearance.     Comments: Pt with very flat facies, laying in bed; wife at bedside; NAD  HENT:     Head: Normocephalic and atraumatic.     Right Ear: External ear normal.     Left Ear: External ear normal.     Nose: Nose normal. No congestion.     Mouth/Throat:     Mouth: Mucous membranes are moist.     Pharynx: Oropharynx is clear. No oropharyngeal exudate.  Eyes:     General:        Right eye: No discharge.        Left eye: No discharge.  Cardiovascular:     Rate and Rhythm: Normal  rate. Rhythm irregular.     Heart sounds: Normal heart sounds. No murmur heard.   No gallop.  Pulmonary:     Effort: Pulmonary effort is normal. No respiratory distress.     Breath sounds: Normal breath sounds. No wheezing or rales.  Abdominal:     General: Abdomen is flat.     Palpations: Abdomen is soft.     Comments: Hyperactive- NT/ND  Genitourinary:    Comments: Was lying in loose stool. Penile meatus with abrasion and dried blood noted in diaper.  Sensitive to touch.  Also appears to have an underlying color of erythema of groin/penis- MASD? Musculoskeletal:     Cervical back: Normal range of motion. No rigidity.     Comments: 1-2+ edema RUE. Graft with +thrill. Well healed incisions right hand/wrist from prior surgery. Left hip incision with surgical dressing. R-BKA incision C/D/I and healing well.   Skin:    Comments: Perineum wet/moist--tender on exam.  MASD? LUE  IV- looks OK Thrill (+) in RUE- midl to moderate RUE swelling below/around fistula  Neurological:     Mental Status: He is alert.     Comments: Needs cues and encouragement for verbal out. Staccato voice with one word output. He was oriented to self only only. He is able to follow simple one step motor commands.  Speaks 1 word answers- appears to be confused/very flat; Ox1- c/o groin discomfort  Psychiatric:     Comments: Flat facies/extreme      Lab Results Last 48 Hours        Results for orders placed or performed during the hospital encounter of 06/03/21 (from the past 48 hour(s))  Glucose, capillary     Status: Abnormal    Collection Time: 06/08/21  4:54 PM  Result Value Ref Range    Glucose-Capillary 162 (H) 70 - 99 mg/dL      Comment: Glucose reference range applies only to samples taken after fasting for at least 8 hours.  Glucose, capillary     Status: Abnormal    Collection Time: 06/08/21  9:36 PM  Result Value Ref Range    Glucose-Capillary 214 (H) 70 - 99 mg/dL      Comment: Glucose reference range applies only to samples taken after fasting for at least 8 hours.  Glucose, capillary     Status: Abnormal    Collection Time: 06/09/21 12:36 AM  Result Value Ref Range    Glucose-Capillary 192 (H) 70 - 99 mg/dL      Comment: Glucose reference range applies only to samples taken after fasting for at least 8 hours.  Renal function panel     Status: Abnormal    Collection Time: 06/09/21  2:17 AM  Result Value Ref Range    Sodium 135 135 - 145 mmol/L    Potassium 5.1 3.5 - 5.1 mmol/L    Chloride 96 (L) 98 - 111 mmol/L    CO2 29 22 - 32 mmol/L    Glucose, Bld 180 (H) 70 - 99 mg/dL      Comment: Glucose reference range applies only to samples taken after fasting for at least 8 hours.    BUN 77 (H) 8 - 23 mg/dL    Creatinine, Ser 8.02 (H) 0.61 - 1.24 mg/dL    Calcium 8.5 (L) 8.9 - 10.3 mg/dL    Phosphorus 3.8 2.5 - 4.6 mg/dL    Albumin 2.4 (L) 3.5 - 5.0 g/dL    GFR,  Estimated 7 (L) >60 mL/min  Comment: (NOTE) Calculated using the CKD-EPI Creatinine Equation (2021)      Anion gap 10 5 - 15      Comment: Performed at [facility], [place] [address], [place], [place] [address]  CBC     Status: Abnormal    Collection Time: 06/09/21  2:17 AM  Result Value Ref Range    WBC 7.2 4.0 - 10.5 K/uL    RBC 2.46 (L) 4.22 - 5.81 MIL/uL    Hemoglobin 7.2 (L) 13.0 - 17.0 g/dL    HCT 22.2 (L) 39.0 - 52.0 %    MCV 90.2 80.0 - 100.0 fL    MCH 29.3 26.0 - 34.0 pg    MCHC 32.4 30.0 - 36.0 g/dL    RDW 20.0 (H) 11.5 - 15.5 %    Platelets 177 150 - 400 K/uL    nRBC 0.0 0.0 - 0.2 %      Comment: Performed at [facility], [place] [address], [place], [place] [address]  Glucose, capillary     Status: None    Collection Time: 06/09/21  6:43 AM  Result Value Ref Range    Glucose-Capillary 90 70 - 99 mg/dL      Comment: Glucose reference range applies only to samples taken after fasting for at least 8 hours.  Prepare RBC (crossmatch)     Status: None    Collection Time: 06/09/21  8:24 AM  Result Value Ref Range    Order Confirmation          ORDER PROCESSED BY BLOOD BANK Performed at [facility], [address], [place], [place] [address]    Glucose, capillary     Status: Abnormal    Collection Time: 06/09/21 11:14 AM  Result Value Ref Range    Glucose-Capillary 126 (H) 70 - 99 mg/dL      Comment: Glucose reference range applies only to samples taken after fasting for at least 8 hours.  Glucose, capillary     Status: Abnormal    Collection Time: 06/09/21  4:41 PM  Result Value Ref Range    Glucose-Capillary 101 (H) 70 - 99 mg/dL      Comment: Glucose reference range applies only to samples taken after fasting for at least 8 hours.  Glucose, capillary     Status: Abnormal    Collection Time: 06/09/21  9:31 PM  Result Value Ref Range    Glucose-Capillary 142 (H) 70 - 99 mg/dL      Comment: Glucose reference range applies only to samples taken after  fasting for at least 8 hours.  Glucose, capillary     Status: Abnormal    Collection Time: 06/10/21 12:01 AM  Result Value Ref Range    Glucose-Capillary 123 (H) 70 - 99 mg/dL      Comment: Glucose reference range applies only to samples taken after fasting for at least 8 hours.  Glucose, capillary     Status: None    Collection Time: 06/10/21  8:07 AM  Result Value Ref Range    Glucose-Capillary 80 70 - 99 mg/dL      Comment: Glucose reference range applies only to samples taken after fasting for at least 8 hours.  Glucose, capillary     Status: Abnormal    Collection Time: 06/10/21 11:54 AM  Result Value Ref Range    Glucose-Capillary 136 (H) 70 - 99 mg/dL      Comment: Glucose reference range applies only to samples taken after  fasting for at least 8 hours.      Imaging Results (Last 48 hours)  No results found.           Medical Problem List and Plan: 1.  L hemiarthroplasty of L hip  secondary to L hip fracture             -patient may  shower if covers bandage             -ELOS/Goals: mod to min A 18-22 days 2.  Antithrombotics: -DVT/anticoagulation:  Pharmaceutical: Other (comment)--Eliquis bid             -antiplatelet therapy: Resume ASA.  3. Pain Management:  Hydrocodone prn but not able to express needs/wants             --will schedule a dose of hydrocodone every morning and monitor for tolerance.  4. Mood: LCSW to follow for evaluation and support.              -antipsychotic agents: N/a 5. Neuropsych: This patient is not capable of making decisions on his own behalf. 6. Skin/Wound Care: Continue Cefadroxil 500 mg/every 36 hour for wound prophylaxis- ends today.               --Nystatin cream and powder to perineum and penile meatus.              --Gerhardt's butt cream to sacrum  7. Fluids/Electrolytes/Nutrition: strict I/O. Add 1200 cc FR.  --Discontinue ensure as nephro also on board.  8. T2DM: Monitor BS ac/hs and use SSI for elevated BS. 9. ESRD: Schedule  HD at the end of the day MWF to  help with tolerance of therapy.             --Strict I/O. Daily wts. Renvela for metabolic bone disease 10. CAD/ICM: Monitor for symptoms with increase in activity. No meds to avoid hypotension w/HD.              --On  Lipitor.  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  12. Acute on chronic anemia: Monitor H/H with HD labs. Now on Aranesp weekly  13. Diarrhea: Felt to be due to antibiotics.  --Will add fibercon for bulking.  14. Acute on chronic insomnia: Will schedule Trazodone 25 mg/hs             --sleep chart. 42. Wife said pockets food- needs D2 diet per wife.      I have personally performed a face to face diagnostic evaluation of this patient and formulated the key components of the plan.  Additionally, I have personally reviewed laboratory data, imaging studies, as well as relevant notes and concur with the physician assistant's documentation above.   The patient's status has not changed from the original H&P.  Any changes in documentation from the acute care chart have been noted above.               Bary Leriche, PA-C 06/10/2021

## 2021-06-10 NOTE — H&P (Signed)
Physical Medicine and Rehabilitation Admission H&P    Chief Complaint  Patient presents with   Functional decline post left hip hemi    HPI:  Jonathan [name] is a 70 year old male with history of T2DM, CAD, ICM w/chronic systolic CHF, ESRD s/p failed renal transplant -HD MWF, recurrent LV thrombus, PAF, CVA, vascular dementia, R-BKA who was admitted on 06/03/21 after falling while attempting to use his prosthesis. He had immediate onset of pain with difficulty walking due to left displaced femoral neck fracture. He was evaluated by Dr. Zachery Dakins, cleared for surgery by Dr. Marcelle Smiling and underwent left hip hemi on 06/04/21. Post op WBAT with hip precautions, was bridged with heparin and slowly transitioned back to Eliquis 5 mg bid.   Cefadroxil bid X 7 days recommended by ortho due to given multiple comorbidities and increased risk for post op infection.  He did have drop in Hgb to 6.4 on 10/30 and was transfused with one unit PRBC.  IR consulted for fistula evaluation due to ongoing issues with RUE swelling of few weeks duration and fistulagram performed without intervention on 10/31. He continues to have limitation in mobility and ADLs due to pian, decreased   Per family member, pt was using his R BKA prosthesis to walk at home for 2 weeks prior to fall.  Is oliguric- pees ~ 1x/day.  Also having a lot of diarrhea and groin/penis red.  Per pt, pain is "ok"- esp at rest.  Per wife, also pockets food and needs D2 diet.   Review of Systems  Unable to perform ROS: Mental acuity  Gastrointestinal:  Positive for diarrhea.  Musculoskeletal:  Positive for joint pain.  Neurological:  Positive for weakness.  Psychiatric/Behavioral:  Positive for memory loss. The patient has insomnia.     Past Medical History:  Diagnosis Date   CAD (coronary artery disease)    CHF (congestive heart failure) ([name])    Diabetes mellitus without complication ([place])    type 2   DM (diabetes mellitus) ([place])     ESRD (end stage renal disease) ([place])    FUO (fever of unknown origin) 09/17/2019   Hyperlipidemia    Hypertension    Osteomyelitis ([place])    Peripheral arterial disease ([name])    PVD (peripheral vascular disease) ([place])    Renal disorder 2015   right kidney transplant   Stroke [facility])    Vascular dementia [facility])     Past Surgical History:  Procedure Laterality Date   ABDOMINAL AORTOGRAM N/A 08/05/2020   Procedure: ABDOMINAL AORTOGRAM;  Surgeon: [name], MD;  Location: [place] CV LAB;  Service: Cardiovascular;  Laterality: N/A;   ABDOMINAL AORTOGRAM W/LOWER EXTREMITY N/A 09/09/2020   Procedure: ABDOMINAL AORTOGRAM W/LOWER EXTREMITY;  Surgeon: [name], MD;  Location: [place] [facility];  Service: Cardiovascular;  Laterality: N/A;   AMPUTATION Right 08/06/2020   Procedure: Right Fifth Toe Ray Amputation;  Surgeon: [name], MD;  Location: [place];  Service: Vascular;  Laterality: Right;   AMPUTATION Right 11/02/2020   Procedure: RIGHT AMPUTATION BELOW KNEE;  Surgeon: [name], MD;  Location: [place];  Service: Vascular;  Laterality: Right;   BACK SURGERY     Has had 2 back surgeries   BUBBLE STUDY  09/18/2019   Procedure: BUBBLE STUDY;  Surgeon: Buford Dresser, MD;  Location: [place];  Service: Cardiovascular;;   Depression     ENDARTERECTOMY FEMORAL Right 08/06/2020   Procedure: Right Ilio- Femoral Artery  Endarterectomy;  Surgeon: [name], MD;  [facility];  Service: Vascular;  Laterality: Right;   HAND SURGERY Right    HAND SURGERY     HIP ARTHROPLASTY Left 06/04/2021   Procedure: ARTHROPLASTY BIPOLAR HIP (HEMIARTHROPLASTY);  Surgeon: Willaim Sheng, MD;  Location: [place];  Service: Orthopedics;  Laterality: Left;   IR DIALY SHUNT INTRO NEEDLE/INTRACATH INITIAL W/IMG RIGHT Right 06/07/2021   IR REMOVAL TUN CV CATH W/O FL  09/03/2019   KIDNEY TRANSPLANT     November 2015   LEG AMPUTATION BELOW KNEE Right    LOWER EXTREMITY  ANGIOGRAPHY Bilateral 08/05/2020   Procedure: LOWER EXTREMITY ANGIOGRAPHY;  Surgeon: [name], MD;  Location: [place] [facility];  Service: Cardiovascular;  Laterality: Bilateral;   Memory loss     NEPHRECTOMY TRANSPLANTED ORGAN     PATCH ANGIOPLASTY Right 08/06/2020   Procedure: PATCH ANGIOPLASTY;  Surgeon: [name], MD;  [facility];  Service: Vascular;  Laterality: Right;   PERIPHERAL VASCULAR BALLOON ANGIOPLASTY Right 09/09/2020   Procedure: PERIPHERAL VASCULAR BALLOON ANGIOPLASTY;  Surgeon: [name], MD;  Location: [place] [facility];  Service: Cardiovascular;  Laterality: Right;  popliteal   PERIPHERAL VASCULAR INTERVENTION Right 08/05/2020   Procedure: PERIPHERAL VASCULAR INTERVENTION;  Surgeon: [name], MD;  Location: [place] [facility];  Service: Cardiovascular;  Laterality: Right;  SFA   PERIPHERAL VASCULAR INTERVENTION Right 09/09/2020   Procedure: PERIPHERAL VASCULAR INTERVENTION;  Surgeon: [name], MD;  Location: [place] [facility];  Service: Cardiovascular;  Laterality: Right;  external iliac   TEE WITHOUT CARDIOVERSION N/A 09/18/2019   Procedure: TRANSESOPHAGEAL ECHOCARDIOGRAM (TEE);  Surgeon: Buford Dresser, MD;  Location: [facility] ENDOSCOPY;  Service: Cardiovascular;  Laterality: N/A;   WOUND DEBRIDEMENT Right 10/08/2020   Procedure: DEBRIDEMENT WOUND RIGHT FOOT;  Surgeon: [name], MD;  Location: [facility] OR;  Service: Vascular;  Laterality: Right;    Family History  Problem Relation Age of Onset   Diabetes Mellitus II Mother    Cancer Mother        STOMACH   Diabetes Mellitus II Brother    Other Father        UNKOWN   Healthy Daughter    Diabetes Son     Social History:  Married and independent PTA. He used to work for [employer] in [place]. Wife works nights. Per reports that he has quit smoking. He has never used smokeless tobacco. Per reports that he does not drink alcohol and does not use  drugs.   Allergies: No Known Allergies   Medications Prior to Admission  Medication Sig Dispense Refill   allopurinol (ZYLOPRIM) 100 MG tablet Take 100 mg by mouth at bedtime.     apixaban (ELIQUIS) 5 MG TABS tablet Take 5 mg by mouth 2 (two) times daily.     aspirin 81 MG chewable tablet Chew 81 mg by mouth daily.      atorvastatin (LIPITOR) 40 MG tablet Take 40 mg by mouth at bedtime.     b complex-vitamin c-folic acid (NEPHRO-VITE) 0.8 MG TABS tablet Take 1 tablet by mouth daily.     feeding supplement (BOOST HIGH PROTEIN) LIQD Take 1 Container by mouth daily as needed (nutrition).     folic acid (FOLVITE) 1 MG tablet Take 1 tablet (1 mg total) by mouth daily. 30 tablet 3   insulin aspart (NOVOLOG) 100 UNIT/ML injection Insulin sliding scale: Blood sugar  120-150   3units  151-200   4units                       201-250   7units                       251- 300  11units                       301-350   15uints                       351-400   20units                       >400         call MD immediately 10 mL 0   Insulin Pen Needle 32G X 4 MM MISC 1 each by Other route as needed (insulin).      levalbuterol (XOPENEX HFA) 45 MCG/ACT inhaler Inhale 2 puffs into the lungs every 8 (eight) hours as needed for wheezing. 1 Inhaler 12   lidocaine-prilocaine (EMLA) cream Apply 1 application topically 3 (three) times a week. 30 minutes prior to Dialysis - MWF  11   oxyCODONE-acetaminophen (PERCOCET/ROXICET) 5-325 MG tablet Take 1 tablet by mouth every 6 (six) hours as needed for moderate pain. 5 tablet 0   predniSONE (DELTASONE) 5 MG tablet Take 5 mg by mouth daily with breakfast.     senna-docusate (SENOKOT-S) 8.6-50 MG tablet Take 1 tablet by mouth 2 (two) times daily. Hold if diarrhea (Patient taking differently: Take 1 tablet by mouth 2 (two) times daily as needed for mild constipation.)     sevelamer carbonate (RENVELA) 800 MG tablet Take 800 mg by mouth 3 (three) times  daily with meals.     donepezil (ARICEPT) 10 MG tablet Take 1 tablet (10 mg total) by mouth at bedtime. Start half a tablet daily for 4 weeks to be increased to 1 tablet daily if tolerated 30 tablet 3    Drug Regimen Review  Drug regimen was reviewed and remains appropriate with no significant issues identified  Home: Home Living Family/patient expects to be discharged to:: Private residence Living Arrangements: Spouse/significant other, Children, Other relatives Available Help at Discharge: Family, Available PRN/intermittently (wife works) Type of Home: House Home Access: Stairs to enter Technical brewer of Steps: 1 Entrance Stairs-Rails: None Home Layout: Two level, Bed/bath upstairs, 1/2 bath on main level, Able to live on main level with bedroom/bathroom Alternate Level Stairs-Number of Steps: 12 Alternate Level Stairs-Rails: Right Bathroom Shower/Tub: Chiropodist: Standard Home Equipment: Conservation officer, nature (2 wheels), Rollator (4 wheels), [employer] - single point, Wheelchair - manual Additional Comments: history provided by wife - [facility] MWF through [place] bus   Functional History: Prior Function Prior Level of Function : Needs assist  Cognitive Assist : Mobility (cognitive) Mobility (Cognitive): Step by step cues ADLs (Cognitive): Step by step cues Physical Assist : Mobility (physical), ADLs (physical) Mobility (physical): Bed mobility, Transfers, Gait, Stairs ADLs Comments: spouse reports she bathes and dresses pt  Functional Status:  Mobility: Bed Mobility Overal bed mobility: Needs Assistance Bed Mobility: Supine to Sit Supine to sit: Mod assist, +2 for physical assistance, HOB elevated Sit to supine: Mod assist, +2 for physical assistance General bed mobility comments: Assist with LLE and trunk to get to EOB with cues for technique and sequencing, posterior bias. Assist with scooting bottom to EOB. Step by  step cues needed, difficulty  sequencing. Transfers Overall transfer level: Needs assistance Equipment used: Ambulation equipment used Transfer via Lift Equipment: Stedy Transfers: Sit to/from Stand Sit to Stand: Mod assist, +2 physical assistance, +2 safety/equipment, From elevated surface Stand pivot transfers: Total assist General transfer comment: Stood to [employer] x3 with Mod A +2 and cues for technique/safety. Maintained standing for approx. 1 min increments with encouragement for pericare. Ambulation/Gait General Gait Details: UNable    ADL: ADL Overall ADL's : Needs assistance/impaired Eating/Feeding: Minimal assistance Grooming: Maximal assistance Upper Body Bathing: Maximal assistance Lower Body Bathing: Maximal assistance, +2 for physical assistance, Sit to/from stand Upper Body Dressing : Maximal assistance Lower Body Dressing: Maximal assistance, Sitting/lateral leans Lower Body Dressing Details (indicate cue type and reason): Able to don/doff sock on residual limb with Min A, extended time and cues for initiation and efficiency. Required support to maintain sitting balance while using both hands during task. Likely still requiring Max A overall with LB dressing. Toilet Transfer: Moderate assistance, +2 for physical assistance, +2 for safety/equipment, BSC, Cueing for safety, Cueing for sequencing Charlaine Dalton) Toilet Transfer Details (indicate cue type and reason): Based on performance with sit to stand with [employer]- Water quality scientist and Hygiene: Total assistance, Sit to/from stand, +2 for physical assistance, +2 for safety/equipment, Cueing for safety, Cueing for sequencing Charlaine Dalton) General ADL Comments: Remians limited by pain, balance, activity toelrance, and cognition.  Cognition: Cognition Overall Cognitive Status: History of cognitive impairments - at baseline Orientation Level: Oriented X4 Cognition Arousal/Alertness: Awake/alert Behavior During Therapy: Flat affect Overall Cognitive  Status: History of cognitive impairments - at baseline General Comments: Slow processing, requiring muliple cues to follow basic commands   Blood pressure 137/64, pulse 99, temperature 98.8 F (37.1 C), temperature source Oral, resp. rate 16, height 5\' 9"  (1.753 m), weight 63.2 kg, SpO2 100 %. Physical Exam Vitals and nursing note reviewed. Exam conducted with a chaperone present.  Constitutional:      Appearance: Normal appearance.     Comments: Pt with very flat facies, laying in bed; wife at bedside; NAD  HENT:     Head: Normocephalic and atraumatic.     Right Ear: External ear normal.     Left Ear: External ear normal.     Nose: Nose normal. No congestion.     Mouth/Throat:     Mouth: Mucous membranes are moist.     Pharynx: Oropharynx is clear. No oropharyngeal exudate.  Eyes:     General:        Right eye: No discharge.        Left eye: No discharge.  Cardiovascular:     Rate and Rhythm: Normal rate. Rhythm irregular.     Heart sounds: Normal heart sounds. No murmur heard.   No gallop.  Pulmonary:     Effort: Pulmonary effort is normal. No respiratory distress.     Breath sounds: Normal breath sounds. No wheezing or rales.  Abdominal:     General: Abdomen is flat.     Palpations: Abdomen is soft.     Comments: Hyperactive- NT/ND  Genitourinary:    Comments: Was lying in loose stool. Penile meatus with abrasion and dried blood noted in diaper.  Sensitive to touch.  Also appears to have an underlying color of erythema of groin/penis- MASD? Musculoskeletal:     Cervical back: Normal range of motion. No rigidity.     Comments: 1-2+ edema RUE. Graft with +thrill. Well healed incisions right hand/wrist from prior surgery. Left  hip incision with surgical dressing. R-BKA incision C/D/I and healing well.   Skin:    Comments: Perineum wet/moist--tender on exam.  MASD? LUE IV- looks OK Thrill (+) in RUE- midl to moderate RUE swelling below/around fistula  Neurological:      Mental Status: He is alert.     Comments: Needs cues and encouragement for verbal out. Staccato voice with one word output. He was oriented to self only only. He is able to follow simple one step motor commands.  Speaks 1 word answers- appears to be confused/very flat; Ox1- c/o groin discomfort  Psychiatric:     Comments: Flat facies/extreme    Results for orders placed or performed during the hospital encounter of 06/03/21 (from the past 48 hour(s))  Glucose, capillary     Status: Abnormal   Collection Time: 06/08/21  4:54 PM  Result Value Ref Range   Glucose-Capillary 162 (H) 70 - 99 mg/dL    Comment: Glucose reference range applies only to samples taken after fasting for at least 8 hours.  Glucose, capillary     Status: Abnormal   Collection Time: 06/08/21  9:36 PM  Result Value Ref Range   Glucose-Capillary 214 (H) 70 - 99 mg/dL    Comment: Glucose reference range applies only to samples taken after fasting for at least 8 hours.  Glucose, capillary     Status: Abnormal   Collection Time: 06/09/21 12:36 AM  Result Value Ref Range   Glucose-Capillary 192 (H) 70 - 99 mg/dL    Comment: Glucose reference range applies only to samples taken after fasting for at least 8 hours.  Renal function panel     Status: Abnormal   Collection Time: 06/09/21  2:17 AM  Result Value Ref Range   Sodium 135 135 - 145 mmol/L   Potassium 5.1 3.5 - 5.1 mmol/L   Chloride 96 (L) 98 - 111 mmol/L   CO2 29 22 - 32 mmol/L   Glucose, Bld 180 (H) 70 - 99 mg/dL    Comment: Glucose reference range applies only to samples taken after fasting for at least 8 hours.   BUN 77 (H) 8 - 23 mg/dL   Creatinine, Ser 8.02 (H) 0.61 - 1.24 mg/dL   Calcium 8.5 (L) 8.9 - 10.3 mg/dL   Phosphorus 3.8 2.5 - 4.6 mg/dL   Albumin 2.4 (L) 3.5 - 5.0 g/dL   GFR, Estimated 7 (L) >60 mL/min    Comment: (NOTE) Calculated using the CKD-EPI Creatinine Equation (2021)    Anion gap 10 5 - 15    Comment: Performed at [facility] [address], [place], [place] [address]  CBC     Status: Abnormal   Collection Time: 06/09/21  2:17 AM  Result Value Ref Range   WBC 7.2 4.0 - 10.5 K/uL   RBC 2.46 (L) 4.22 - 5.81 MIL/uL   Hemoglobin 7.2 (L) 13.0 - 17.0 g/dL   HCT 22.2 (L) 39.0 - 52.0 %   MCV 90.2 80.0 - 100.0 fL   MCH 29.3 26.0 - 34.0 pg   MCHC 32.4 30.0 - 36.0 g/dL   RDW 20.0 (H) 11.5 - 15.5 %   Platelets 177 150 - 400 K/uL   nRBC 0.0 0.0 - 0.2 %    Comment: Performed at [facility], [place] [address] [place], [place] [address]  Glucose, capillary     Status: None   Collection Time: 06/09/21  6:43 AM  Result Value Ref Range  Glucose-Capillary 90 70 - 99 mg/dL    Comment: Glucose reference range applies only to samples taken after fasting for at least 8 hours.  Prepare RBC (crossmatch)     Status: None   Collection Time: 06/09/21  8:24 AM  Result Value Ref Range   Order Confirmation      ORDER PROCESSED BY BLOOD BANK Performed at [facility], [address] [place], [place] [address]   Glucose, capillary     Status: Abnormal   Collection Time: 06/09/21 11:14 AM  Result Value Ref Range   Glucose-Capillary 126 (H) 70 - 99 mg/dL    Comment: Glucose reference range applies only to samples taken after fasting for at least 8 hours.  Glucose, capillary     Status: Abnormal   Collection Time: 06/09/21  4:41 PM  Result Value Ref Range   Glucose-Capillary 101 (H) 70 - 99 mg/dL    Comment: Glucose reference range applies only to samples taken after fasting for at least 8 hours.  Glucose, capillary     Status: Abnormal   Collection Time: 06/09/21  9:31 PM  Result Value Ref Range   Glucose-Capillary 142 (H) 70 - 99 mg/dL    Comment: Glucose reference range applies only to samples taken after fasting for at least 8 hours.  Glucose, capillary     Status: Abnormal   Collection Time: 06/10/21 12:01 AM  Result Value Ref Range   Glucose-Capillary 123 (H) 70 - 99 mg/dL    Comment: Glucose reference range  applies only to samples taken after fasting for at least 8 hours.  Glucose, capillary     Status: None   Collection Time: 06/10/21  8:07 AM  Result Value Ref Range   Glucose-Capillary 80 70 - 99 mg/dL    Comment: Glucose reference range applies only to samples taken after fasting for at least 8 hours.  Glucose, capillary     Status: Abnormal   Collection Time: 06/10/21 11:54 AM  Result Value Ref Range   Glucose-Capillary 136 (H) 70 - 99 mg/dL    Comment: Glucose reference range applies only to samples taken after fasting for at least 8 hours.   No results found.     Medical Problem List and Plan: 1.  L hemiarthroplasty of L hip  secondary to L hip fracture  -patient may  shower if covers bandage  -ELOS/Goals: mod to min A 18-22 days 2.  Antithrombotics: -DVT/anticoagulation:  Pharmaceutical: Other (comment)--Eliquis bid  -antiplatelet therapy: Resume ASA.  3. Pain Management:  Hydrocodone prn but not able to express needs/wants  --will schedule a dose of hydrocodone every morning and monitor for tolerance.  4. Mood: LCSW to follow for evaluation and support.   -antipsychotic agents: N/a 5. Neuropsych: This patient is not capable of making decisions on his own behalf. 6. Skin/Wound Care: Continue Cefadroxil 500 mg/every 36 hour for wound prophylaxis- ends today.    --Nystatin cream and powder to perineum and penile meatus.   --Gerhardt's butt cream to sacrum  7. Fluids/Electrolytes/Nutrition: strict I/O. Add 1200 cc FR.  --Discontinue ensure as nephro also on board.  8. T2DM: Monitor BS ac/hs and use SSI for elevated BS. 9. ESRD: Schedule HD at the end of the day MWF to  help with tolerance of therapy.  --Strict I/O. Daily wts. Renvela for metabolic bone disease 10. CAD/ICM: Monitor for symptoms with increase in activity. No meds to avoid hypotension w/HD.   --On  Lipitor.  11. Failed renal transplant:  On low dose prednisone daily.  12. Acute on chronic anemia: Monitor H/H with  HD labs. Now on Aranesp weekly  13. Diarrhea: Felt to be due to antibiotics.  --Will add fibercon for bulking.  14. Acute on chronic insomnia: Will schedule Trazodone 25 mg/hs  --sleep chart. 38. Wife said pockets food- needs D2 diet per wife.    I have personally performed a face to face diagnostic evaluation of this patient and formulated the key components of the plan.  Additionally, I have personally reviewed laboratory data, imaging studies, as well as relevant notes and concur with the physician assistant's documentation above.   The patient's status has not changed from the original H&P.  Any changes in documentation from the acute care chart have been noted above.         Bary Leriche, PA-C 06/10/2021

## 2021-06-10 NOTE — Progress Notes (Signed)
INPATIENT REHABILITATION ADMISSION NOTE   Arrival Method: bed     Mental Orientation:alert   Assessment:done   Skin:done with Autumn RN   IV'S: 2 sites right arm   Pain:grimaces when turning   Tubes and Drains:none   Safety Measures:done; signed   Vital Signs:done   Height and Weight:done   Rehab Orientation:done   Family:with wife [name]    Notes:

## 2021-06-10 NOTE — Care Management Important Message (Signed)
Important Message  Patient Details  Name: Jonathan [name]. MRN: [id number] Date of Birth: April 07, 1951   Medicare Important Message Given:  Yes     Vianka Ertel 06/10/2021, 3:21 PM

## 2021-06-10 NOTE — Discharge Summary (Signed)
Physician Discharge Summary  Jonathan [name] CBJ:[id number] DOB: Sep 23, 1950 DOA: 06/03/2021  PCP: Ginger Organ., MD  Admit date: 06/03/2021 Discharge date: 06/10/2021  Admitted From: Home Discharge disposition: CIR   Code Status: Full Code   Discharge Diagnosis:   Principal Problem:   Hip fracture ([place]) Active Problems:   History of coronary artery disease   ESRD (end stage renal disease) on dialysis ([place])   History of CVA (cerebrovascular accident)   Hyperlipidemia   CHF (congestive heart failure) [facility])   Fall    Chief Complaint  Patient presents with   Hip Pain    Brief narrative: Jonathan Dollar Sr. is a 70 y.o. 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 Patient presented to the ED from home on 10/27 after mechanical fall due to prosthetic leg malfunction while standing up.  Imaging in the ED showed left femoral neck fracture with mild displacement.   Admitted to hospitalist service Ortho surgery was consulted and performed fixation 10/28.    Subjective: Patient was seen and examined this morning.  Pleasant elderly African-American male.  Lying on bed.  Not in distress.  No new symptoms.  Hospital course: Left femoral neck fracture with mild displacement -s/p fixation 10/28. -Ortho following, appreciate recommendations             -Cefadroxil (10/29-11/5)             -Eliquis resumed at full dose 5 mg twice daily -Follow-up outpatient Ortho in 2 weeks postop for wound check -Continue PT/OT at CIR  ESRD MWF HD Failed renal transplant -Management per nephrology.  Last dialysis yesterday 11/2 -continue home prednisone for renal transplant -Continue home Renvela  Acute on chronic anemia -Chronic anemia with hemoglobin mostly between 8 and 9.  Hemoglobin low at 7.2 on 11/2.  1 unit of PRBC was  given with dialysis.  Repeat labs periodically. Recent Labs    08/07/20 0306 08/07/20 0532 08/07/20 1453 11/04/20 0401 11/05/20 1104 05/04/21 1145 06/03/21 1527 06/06/21 1426 06/07/21 0216 06/07/21 0748 06/08/21 0219 06/09/21 0217  HGB 7.8*  --    < > 8.1*   < >  --    < > 7.5* 7.2* 7.1* 8.3* 7.2*  MCV 103.3*  --    < > 92.0   < >  --    < >  --  94.0 94.3 89.1 90.2  VITAMINB12  --  612  --  488  --  1,326*  --   --   --   --   --   --   FOLATE  --  18.5  --  46.9  --   --   --   --   --   --   --   --   FERRITIN  --  1,009*  --   --   --   --   --   --   --   --   --   --   TIBC  --  172*  --   --   --   --   --   --   --   --   --   --   IRON  --  47  --   --   --   --   --   --   --   --   --   --  RETICCTPCT 1.6  --   --   --   --   --   --   --   --   --   --   --    < > = values in this interval not displayed.    HFrEF -EF 30 to 35% Hypertension  -Patient does not seem to be on any CHF meds in his home list, presumably due to hypotension at dialysis. -Need to be addressed as an outpatient.  Prior LV thrombus  -On Eliquis  CAD/MI PAD s/p right BKA Vascular dementia, HLD -Continue aspirin, Eliquis, statin    Type 2 diabetes mellitus -Diet controlled.  A1c 5.3 on 10/28 -Currently on sliding scale insulin -Blood sugar level controlled Recent Labs  Lab 06/09/21 1114 06/09/21 1641 06/09/21 2131 06/10/21 0001 06/10/21 0807  GLUCAP 126* 101* 142* 123* 80   Chronic hepatitis C  History of gout -no acute flareups -Continue home allopurinol   Allergies as of 06/10/2021   No Known Allergies      Medication List     STOP taking these medications    insulin aspart 100 UNIT/ML injection Commonly known as: novoLOG Replaced by: insulin aspart 100 UNIT/ML injection   oxyCODONE-acetaminophen 5-325 MG tablet Commonly known as: PERCOCET/ROXICET       TAKE these medications    allopurinol 100 MG tablet Commonly known as: ZYLOPRIM Take 100 mg by mouth  at bedtime.   apixaban 5 MG Tabs tablet Commonly known as: ELIQUIS Take 5 mg by mouth 2 (two) times daily.   aspirin 81 MG chewable tablet Chew 81 mg by mouth daily.   atorvastatin 40 MG tablet Commonly known as: LIPITOR Take 40 mg by mouth at bedtime.   b complex-vitamin c-folic acid 0.8 MG Tabs tablet Take 1 tablet by mouth daily.   cefadroxil 500 MG capsule Commonly known as: DURICEF Take 1 capsule (500 mg total) by mouth every 36 (thirty-six) hours for 1 day. Start taking on: June 11, 2021   donepezil 10 MG tablet Commonly known as: ARICEPT Take 1 tablet (10 mg total) by mouth at bedtime. Start half a tablet daily for 4 weeks to be increased to 1 tablet daily if tolerated   feeding supplement Liqd Take 1 Container by mouth daily as needed (nutrition).   folic acid 1 MG tablet Commonly known as: FOLVITE Take 1 tablet (1 mg total) by mouth daily.   HYDROcodone-acetaminophen 5-325 MG tablet Commonly known as: NORCO/VICODIN Take 1 tablet by mouth every 6 (six) hours as needed for moderate pain.   insulin aspart 100 UNIT/ML injection Commonly known as: novoLOG Inject 0-9 Units into the skin every 4 (four) hours. Replaces: insulin aspart 100 UNIT/ML injection   Insulin Pen Needle 32G X 4 MM Misc 1 each by Other route as needed (insulin).   levalbuterol 45 MCG/ACT inhaler Commonly known as: XOPENEX HFA Inhale 2 puffs into the lungs every 8 (eight) hours as needed for wheezing.   lidocaine-prilocaine cream Commonly known as: EMLA Apply 1 application topically 3 (three) times a week. 30 minutes prior to Dialysis - MWF   polyethylene glycol 17 g packet Commonly known as: MIRALAX / GLYCOLAX Take 17 g by mouth daily as needed for mild constipation.   predniSONE 5 MG tablet Commonly known as: DELTASONE Take 5 mg by mouth daily with breakfast.   senna-docusate 8.6-50 MG tablet Commonly known as: Senokot-S Take 1 tablet by mouth 2 (two) times daily. Hold if  diarrhea What changed:  when  to take this reasons to take this additional instructions   sevelamer carbonate 800 MG tablet Commonly known as: RENVELA Take 800 mg by mouth 3 (three) times daily with meals.               Discharge Care Instructions  (From admission, onward)           Start     Ordered   06/10/21 0000  No dressing needed        06/10/21 0956            Discharge Instructions:  Diet Recommendation:  Discharge Diet Orders (From admission, onward)     Start     Ordered   06/10/21 0000  Diet general       Comments: Renal diet   06/10/21 0956              @BRDDSCINSTRUCTIONS @  Follow ups:    Follow-up Information     Ginger Organ., MD Follow up.   Specialty: Internal Medicine Contact information: [address] [place] [address] [phone]         Willaim Sheng, MD Follow up in 2 week(s).   Specialty: Orthopedic Surgery Contact information: [place] [address] [phone]                 Wound care:   Incision (Closed) 08/06/20 Groin Right (Active)  Date First Assessed/Time First Assessed: 08/06/20 1505   Location: Groin  Location Orientation: Right    Assessments 08/06/2020  4:15 PM 08/10/2020  8:00 AM  Dressing Type Liquid skin adhesive None  Dressing New drainage;Other (Comment) --  Site / Wound Assessment Bleeding Clean;Dry  Margins Attached edges (approximated) Attached edges (approximated)  Closure Skin glue Approximated;Skin glue  Drainage Amount Scant None  Drainage Description -- No odor     No Linked orders to display     Incision (Closed) 08/06/20 Foot Right (Active)  Date First Assessed/Time First Assessed: 08/06/20 1505   Location: Foot  Location Orientation: Right    Assessments 08/06/2020  4:15 PM 08/10/2020  8:00 AM  Dressing Type Compression wrap Compression wrap;Gauze (Comment)  Dressing Clean;Dry;Intact Clean;Dry;Intact  Site / Wound  Assessment Clean;Dry Dressing in place / Unable to assess  Drainage Amount None None     No Linked orders to display     Incision (Closed) 10/08/20 Foot Right (Active)  Date First Assessed/Time First Assessed: 10/08/20 0759   Location: Foot  Location Orientation: Right    Assessments 10/08/2020  8:06 AM 10/08/2020  9:05 AM  Dressing Type Compression wrap;Gauze (Comment);Moist to dry --  Dressing -- Clean;Intact;Dry     No Linked orders to display     Incision (Closed) 11/02/20 Leg Right (Active)  Date First Assessed/Time First Assessed: 11/02/20 0914   Location: Leg  Location Orientation: Right    Assessments 11/02/2020  9:50 AM 11/06/2020  8:00 PM  Dressing Type Compression wrap Compression wrap  Dressing Clean;Dry;Intact Clean;Dry;Intact  Site / Wound Assessment Dressing in place / Unable to assess Dressing in place / Unable to assess  Drainage Amount None --     No Linked orders to display     Incision (Closed) 06/04/21 Hip Left (Active)  Date First Assessed/Time First Assessed: 06/04/21 1322   Location: Hip  Location Orientation: Left    Assessments 06/04/2021  1:55 PM 06/09/2021  8:22 PM  Dressing Type Hydrocolloid Hydrocolloid  Dressing Clean;Dry;Intact Clean;Dry;Intact  Site / Wound Assessment --  Dressing in place / Unable to assess  Drainage Amount -- None     No Linked orders to display    Discharge Exam:   Vitals:   06/09/21 1507 06/09/21 1551 06/09/21 2131 06/10/21 0811  BP: 134/65 126/65 138/69 137/64  Pulse: 86 82 98 99  Resp: 16 18 16    Temp:  98.8 F (37.1 C) 98.7 F (37.1 C) 98.8 F (37.1 C)  TempSrc:  Oral Oral Oral  SpO2:  100% 100% 100%  Weight:      Height:        Body mass index is 20.58 kg/m.  General exam: Pleasant, elderly African-American male.  Not in distress Skin: No rashes, lesions or ulcers. HEENT: Atraumatic, normocephalic, no obvious bleeding Lungs: Clear to auscultation bilaterally CVS: Regular rate and rhythm, no murmur GI/Abd  soft, nontender, nondistended, bowel sound present CNS: Alert, awake, oriented x3 Psychiatry: Mood appropriate Extremities: Previous right BKA status.  No pedal edema, no calf tenderness on the left  Time coordinating discharge: 35 minutes   The results of significant diagnostics from this hospitalization (including imaging, microbiology, ancillary and laboratory) are listed below for reference.    Procedures and Diagnostic Studies:   DG Chest 1 View  Result Date: 06/03/2021 CLINICAL DATA:  Fall with hip fracture EXAM: CHEST  1 VIEW COMPARISON:  09/15/2019, 07/30/2019 FINDINGS: Chronic elevation of left diaphragm. Diffuse bilateral reticular interstitial opacity consistent with chronic lung disease. Mild cardiomegaly with aortic atherosclerosis. No pleural effusion, pneumothorax, or acute airspace disease. IMPRESSION: Chronic interstitial lung disease.  No acute airspace disease. Electronically Signed   By: Donavan Foil M.D.   On: 06/03/2021 16:00   DG Pelvis Portable  Result Date: 06/04/2021 CLINICAL DATA:  Postop left hemiarthroplasty EXAM: PORTABLE PELVIS 1-2 VIEWS COMPARISON:  Left hip radiographs 06/03/2021 FINDINGS: There has been interval left hip arthroplasty for repair of a previously seen femoral neck fracture. Hardware alignment is within expected limits, without evidence of hardware related complication. Right hip alignment is stable. The SI joints and symphysis pubis are intact. Extensive vascular calcifications and a right femoral stent are noted. IMPRESSION: Status post left hip arthroplasty without evidence of complication. Electronically Signed   By: Valetta Mole M.D.   On: 06/04/2021 15:50   ECHOCARDIOGRAM COMPLETE  Result Date: 06/04/2021    ECHOCARDIOGRAM REPORT   Patient Name:   Jonathan OLIVAR Sr. Date of Exam: 06/04/2021 Medical Rec #:  [id number]          Height:       69.0 in Accession #:    [id number]         Weight:       160.0 lb Date of Birth:  1950/10/18           BSA:          1.879 m Patient Age:    24 years           BP:           142/62 mmHg Patient Gender: M                  HR:           91 bpm. Exam Location:  Inpatient Procedure: 2D Echo, Cardiac Doppler, Color Doppler and Intracardiac            Opacification Agent Indications:     Apical Thrombus  History:         Patient has prior history of Echocardiogram examinations, most  recent 08/01/2019. Risk Factors:Diabetes, Hypertension and                  Family History of Coronary Artery Disease.  Sonographer:     Helmut Muster Referring Phys:  [id number] [place] Diagnosing Phys: Mertie Moores MD IMPRESSIONS  1. With Definity contrast, an apical thrombus is visualized in the LV apex.         . Left ventricular ejection fraction, by estimation, is 30 to 35%. The left ventricle has moderately decreased function. The left ventricle demonstrates regional wall motion abnormalities (see scoring diagram/findings for description). The left ventricular internal cavity size was mildly to moderately dilated. Left ventricular diastolic parameters are consistent with Grade II diastolic dysfunction (pseudonormalization). There is severe akinesis of the left ventricular, mid-apical anteroseptal wall and apical segment.  2. Right ventricular systolic function is normal. The right ventricular size is normal.  3. Left atrial size was moderately dilated.  4. Right atrial size was mildly dilated.  5. A small pericardial effusion is present.  6. The mitral valve is grossly normal. Severe mitral valve regurgitation.  7. The aortic valve is grossly normal. Aortic valve regurgitation is not visualized. FINDINGS  Left Ventricle: With Definity contrast, an apical thrombus is visualized in the LV apex. Left ventricular ejection fraction, by estimation, is 30 to 35%. The left ventricle has moderately decreased function. The left ventricle demonstrates regional wall motion abnormalities. Severe akinesis of the left  ventricular, mid-apical anteroseptal wall and apical segment. Definity contrast agent was given IV to delineate the left ventricular endocardial borders. The left ventricular internal cavity size was mildly to moderately dilated. There is no left ventricular hypertrophy. Left ventricular diastolic parameters are consistent with Grade II diastolic dysfunction (pseudonormalization). Right Ventricle: The right ventricular size is normal. Right vetricular wall thickness was not well visualized. Right ventricular systolic function is normal. Left Atrium: Left atrial size was moderately dilated. Right Atrium: Right atrial size was mildly dilated. Pericardium: A small pericardial effusion is present. Mitral Valve: The mitral valve is grossly normal. Severe mitral valve regurgitation. Tricuspid Valve: The tricuspid valve is grossly normal. Tricuspid valve regurgitation is not demonstrated. Aortic Valve: The aortic valve is grossly normal. Aortic valve regurgitation is not visualized. Pulmonic Valve: The pulmonic valve was not well visualized. Pulmonic valve regurgitation is not visualized. Aorta: The aortic root and ascending aorta are structurally normal, with no evidence of dilitation. IAS/Shunts: The atrial septum is grossly normal. Additional Comments: A device lead is visualized.  LEFT VENTRICLE PLAX 2D LVIDd:         5.00 cm      Diastology LVIDs:         4.20 cm      LV e' medial:    6.66 cm/s LV PW:         1.00 cm      LV E/e' medial:  19.1 LV IVS:        1.00 cm      LV e' lateral:   9.14 cm/s LVOT diam:     1.90 cm      LV E/e' lateral: 13.9 LV SV:         48 LV SV Index:   25 LVOT Area:     2.84 cm  LV Volumes (MOD) LV vol d, MOD A2C: 257.0 ml LV vol d, MOD A4C: 205.0 ml LV vol s, MOD A2C: 173.0 ml LV vol s, MOD A4C: 161.0 ml LV SV MOD A2C:  84.0 ml LV SV MOD A4C:     205.0 ml LV SV MOD BP:      66.5 ml RIGHT VENTRICLE             IVC RV S prime:     11.70 cm/s  IVC diam: 2.20 cm TAPSE (M-mode): 2.1 cm LEFT  ATRIUM             Index        RIGHT ATRIUM           Index LA diam:        5.00 cm 2.66 cm/m   RA Area:     18.40 cm LA Vol (A2C):   72.0 ml 38.32 ml/m  RA Volume:   51.40 ml  27.35 ml/m LA Vol (A4C):   97.2 ml 51.73 ml/m LA Biplane Vol: 84.1 ml 44.76 ml/m  AORTIC VALVE LVOT Vmax:   104.00 cm/s LVOT Vmean:  68.500 cm/s LVOT VTI:    0.169 m  AORTA Ao Root diam: 2.90 cm Ao Asc diam:  3.30 cm MITRAL VALVE MV Area (PHT): 4.71 cm       SHUNTS MV Decel Time: 161 msec       Systemic VTI:  0.17 m MR Peak grad:    162.3 mmHg   Systemic Diam: 1.90 cm MR Mean grad:    97.0 mmHg MR Vmax:         637.00 cm/s MR Vmean:        460.0 cm/s MR PISA:         5.09 cm MR PISA Eff ROA: 25 mm MR PISA Radius:  0.90 cm MV E velocity: 127.00 cm/s MV A velocity: 105.00 cm/s MV E/A ratio:  1.21 Mertie Moores MD Electronically signed by Mertie Moores MD Signature Date/Time: 06/04/2021/10:06:57 AM    Final    DG Hip Unilat With Pelvis 2-3 Views Left  Result Date: 06/03/2021 CLINICAL DATA:  Fall EXAM: DG HIP (WITH OR WITHOUT PELVIS) 2-3V LEFT COMPARISON:  None. FINDINGS: Extensive vascular calcifications. Pubic symphysis and rami appear intact. Acute left femoral neck fracture with cranial displacement of trochanter. Femoral head projects in joint. IMPRESSION: Acute mildly displaced left femoral neck fracture Electronically Signed   By: Donavan Foil M.D.   On: 06/03/2021 15:58     Labs:   Basic Metabolic Panel: Recent Labs  Lab 06/05/21 0320 06/06/21 0259 06/07/21 0740 06/08/21 0219 06/09/21 0217  NA 135 132* 134* 133* 135  K 4.5 4.4 4.8 4.5 5.1  CL 95* 94* 94* 97* 96*  CO2 28 28 27 25 29   GLUCOSE 113* 176* 98 286* 180*  BUN 38* 69* 92* 57* 77*  CREATININE 5.56* 7.37* 9.42* 6.62* 8.02*  CALCIUM 8.4* 8.3* 8.4* 8.0* 8.5*  PHOS  --  5.9* 5.3* 4.2 3.8   GFR Estimated Creatinine Clearance: 7.9 mL/min (A) (by C-G formula based on SCr of 8.02 mg/dL (H)). Liver Function Tests: Recent Labs  Lab 06/05/21 0320  06/06/21 0259 06/07/21 0740 06/08/21 0219 06/09/21 0217  AST 18  --   --   --   --   ALT 12  --   --   --   --   ALKPHOS 50  --   --   --   --   BILITOT 0.6  --   --   --   --   PROT 5.5*  --   --   --   --   ALBUMIN 2.8* 2.4* 2.4* 2.5*  2.4*   No results for input(s): LIPASE, AMYLASE in the last 168 hours. No results for input(s): AMMONIA in the last 168 hours. Coagulation profile Recent Labs  Lab 06/04/21 0459  INR 1.3*    CBC: Recent Labs  Lab 06/03/21 1527 06/03/21 1952 06/05/21 0320 06/06/21 0259 06/06/21 1426 06/07/21 0216 06/07/21 0748 06/08/21 0219 06/09/21 0217  WBC 7.1   < > 7.8 7.9  --  8.1 7.7 6.3 7.2  NEUTROABS 5.9  --  6.2 6.0  --  6.3  --  4.8  --   HGB 12.5*   < > 8.8* 6.4* 7.5* 7.2* 7.1* 8.3* 7.2*  HCT 37.7*   < > 27.2* 18.9* 22.5* 21.9* 21.5* 24.6* 22.2*  MCV 95.9   < > 96.8 95.9  --  94.0 94.3 89.1 90.2  PLT 113*   < > 152 144*  --  142* 154 170 177   < > = values in this interval not displayed.   Cardiac Enzymes: No results for input(s): CKTOTAL, CKMB, CKMBINDEX, TROPONINI in the last 168 hours. BNP: Invalid input(s): POCBNP CBG: Recent Labs  Lab 06/09/21 1114 06/09/21 1641 06/09/21 2131 06/10/21 0001 06/10/21 0807  GLUCAP 126* 101* 142* 123* 80   D-Dimer No results for input(s): DDIMER in the last 72 hours. Hgb A1c No results for input(s): HGBA1C in the last 72 hours. Lipid Profile No results for input(s): CHOL, HDL, LDLCALC, TRIG, CHOLHDL, LDLDIRECT in the last 72 hours. Thyroid function studies No results for input(s): TSH, T4TOTAL, T3FREE, THYROIDAB in the last 72 hours.  Invalid input(s): FREET3 Anemia work up No results for input(s): VITAMINB12, FOLATE, FERRITIN, TIBC, IRON, RETICCTPCT in the last 72 hours. Microbiology Recent Results (from the past 240 hour(s))  Resp Panel by RT-PCR (Flu A&B, Covid) Nasopharyngeal Swab     Status: None   Collection Time: 06/03/21  4:01 PM   Specimen: Nasopharyngeal Swab;  Nasopharyngeal(NP) swabs in vial transport medium  Result Value Ref Range Status   SARS Coronavirus 2 by RT PCR NEGATIVE NEGATIVE Final    Comment: (NOTE) SARS-CoV-2 target nucleic acids are NOT DETECTED.  The SARS-CoV-2 RNA is generally detectable in upper respiratory specimens during the acute phase of infection. The lowest concentration of SARS-CoV-2 viral copies this assay can detect is 138 copies/mL. A negative result does not preclude SARS-Cov-2 infection and should not be used as the sole basis for treatment or other patient management decisions. A negative result may occur with  improper specimen collection/handling, submission of specimen other than nasopharyngeal swab, presence of viral mutation(s) within the areas targeted by this assay, and inadequate number of viral copies(<138 copies/mL). A negative result must be combined with clinical observations, patient history, and epidemiological information. The expected result is Negative.  Fact Sheet for Patients:  EntrepreneurPulse.com.au  Fact Sheet for Healthcare Providers:  IncredibleEmployment.be  This test is no t yet approved or cleared by the [place] FDA and  has been authorized for detection and/or diagnosis of SARS-CoV-2 by FDA under an Emergency Use Authorization (EUA). This EUA will remain  in effect (meaning this test can be used) for the duration of the COVID-19 declaration under Section 564(b)(1) of the Act, 21 U.S.C.section 360bbb-3(b)(1), unless the authorization is terminated  or revoked sooner.       Influenza A by PCR NEGATIVE NEGATIVE Final   Influenza B by PCR NEGATIVE NEGATIVE Final    Comment: (NOTE) The Xpert Xpress SARS-CoV-2/FLU/RSV plus assay is intended as an aid in the diagnosis  of influenza from Nasopharyngeal swab specimens and should not be used as a sole basis for treatment. Nasal washings and aspirates are unacceptable for Xpert Xpress  SARS-CoV-2/FLU/RSV testing.  Fact Sheet for Patients: EntrepreneurPulse.com.au  Fact Sheet for Healthcare Providers: IncredibleEmployment.be  This test is not yet approved or cleared by the [place] FDA and has been authorized for detection and/or diagnosis of SARS-CoV-2 by FDA under an Emergency Use Authorization (EUA). This EUA will remain in effect (meaning this test can be used) for the duration of the COVID-19 declaration under Section 564(b)(1) of the Act, 21 U.S.C. section 360bbb-3(b)(1), unless the authorization is terminated or revoked.  Performed at [facility], [place] [address], [place], [place] [address]   Surgical pcr screen     Status: None   Collection Time: 06/04/21  9:57 AM   Specimen: Nasal Mucosa; Nasal Swab  Result Value Ref Range Status   MRSA, PCR NEGATIVE NEGATIVE Final   Staphylococcus aureus NEGATIVE NEGATIVE Final    Comment: (NOTE) The Xpert SA Assay (FDA approved for NASAL specimens in patients 90 years of age and older), is one component of a comprehensive surveillance program. It is not intended to diagnose infection nor to guide or monitor treatment. Performed at [facility], [place] [address] [place], [place] [address]      Signed: Terrilee Croak  [facility] 06/10/2021, 9:57 AM

## 2021-06-10 NOTE — Progress Notes (Addendum)
Jonathan Heys, MD   Physician  CASE MANAGEMENT  PMR Pre-admission      Signed  Date of Service:  06/06/2021  9:44 AM       Related encounter: ED to Hosp-Admission (Current) from 06/03/2021 in [place]                                                                                                                                                                                                                                                                                                                                                                                                                                                                                             PMR Admission Coordinator Pre-Admission Assessment   Patient: Jonathan [name]. is an 70 y.o., male MRN: [id number] DOB: 1950-10-23 Height: _0  (175.3 cm) Weight: 62 kg   Insurance Information HMO:     PPO:      PCP:      IPA:      80/20:      OTHER:  PRIMARY: Corporate treasurer [employer]  [employer] of [place])     Policy#: [id number]      Subscriber: Pt CM Name: Denyce Robert     Phone#:  [phone]  Fax#: [phone] Pre-Cert#: [id number] with updates due on 06/14/21    Employer:  Benefits:  Phone #: [phone]    Name: Jenetta Loges Date: 08/08/2014 - still active Deductible: $150 ($150 met) OOP Max: $1,500 ($1,500 met) CIR: 95% coverage, 5% co-insurance SNF: 95% coverage, 5% co-insurance; limited to 100 days/cal yr (100 remaining) Outpatient: 95% coverage, 5% co-insurance Home Health: 95% coverage, 5% co-insurance DME: 95% coverage, 5% co-insurance Providers: in network  SECONDARY:       Policy#:      Phone#:      Development worker, community:       Phone#:    The Engineer, petroleum" for  patients in Inpatient Rehabilitation Facilities with attached "Privacy Act [place] Records" was provided and verbally reviewed with: Family   Emergency Contact Information Contact Information       Name Relation Home Work Mobile    Joelle, Flessner Spouse     [phone]    Morell, Mears [phone]               Current Medical History  Patient Admitting Diagnosis: L Hip fx   History of Present Illness: Jonathan [name]. is a 70 y.o. 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 fib, vascular dementia.  He presents to the ED 06/03/21  via EMS complaining of left hip pain after a mechanical fall.  Labs showing WBC 7.1, hemoglobin 12.5 (stable), platelet count 113k (was 88k on 03/12/2021).  Patient was a hard stick and not enough blood collected to process BMP.  I-STAT chemistry showing sodium 136, potassium 4.5, chloride 94, bicarb 31, BUN 51, creatinine 8.4, glucose 119.  COVID and influenza PCR negative.  X-ray of left hip/pelvis showing acute mildly displaced left femoral neck fracture.  Chest x-ray showing chronic interstitial lung disease; no acute airspace disease . L  hip hemiarthroplasty  performed by ortho 06/04/21. Stay complicated by ABL and Pt. Received 1 unit PRBCs 06/06/21. CIR consulted to assist return to PLOF.   Patient[facility] medical record from [facility] has been reviewed by the rehabilitation admission coordinator and physician.   Past Medical History      Past Medical History:  Diagnosis Date   CAD (coronary artery disease)     CHF (congestive heart  failure) ([name])     Diabetes mellitus without complication ([place])      type 2   DM (diabetes mellitus) ([place])     ESRD (end stage renal disease) ([place])     FUO (fever of unknown origin) 09/17/2019   Hyperlipidemia     Hypertension     Osteomyelitis ([place])     Peripheral arterial disease ([name])     PVD (peripheral vascular disease) ([place])     Renal disorder 2015    right kidney transplant   Stroke [facility])     Vascular dementia ([place])        Has the patient had major surgery during 100 days prior to admission?  Yes   Family History   family history includes Cancer in his mother; Diabetes in his son; Diabetes Mellitus II in his brother and mother; Healthy in his daughter; Other in his father.   Current Medications   Current Facility-Administered Medications:    0.9 %  sodium chloride infusion (Manually program via Guardrails IV Fluids), , Intravenous, Once, Hall, Carole N, DO   allopurinol (ZYLOPRIM) tablet 100 mg, 100 mg, Oral, QHS, Willaim Sheng, MD, 100 mg at 06/05/21 2113   apixaban (ELIQUIS) tablet 2.5 mg, 2.5 mg, Oral, BID, Willaim Sheng, MD, 2.5 mg at 06/05/21 2113   [START ON 06/07/2021] apixaban (ELIQUIS) tablet 5 mg, 5 mg, Oral, BID, Marchwiany, Virgina Norfolk, MD   atorvastatin (LIPITOR) tablet 40 mg, 40 mg, Oral, QHS, Willaim Sheng, MD, 40 mg at 06/05/21 2113   cefadroxil (DURICEF) capsule 500 mg, 500 mg, Oral, Once, Franky Macho, RPH   [START ON 06/07/2021] cefadroxil (DURICEF) capsule 500 mg, 500 mg, Oral, Q36H, Franky Macho, RPH   Chlorhexidine Gluconate Cloth 2 % PADS 6 each, 6 each, Topical, Q0600, Willaim Sheng, MD, 6 each at 06/06/21 [phone]   feeding supplement (ENSURE ENLIVE / ENSURE PLUS) liquid 237 mL, 237 mL, Oral, TID BM, Samtani, Jai-Gurmukh, MD, 237 mL at 06/05/21 2115   feeding supplement (NEPRO CARB STEADY) liquid 237 mL, 237 mL, Oral, BID BM, Ernest Haber, PA-C, 237 mL at 97/02/63 7858   folic acid (FOLVITE) tablet 1 mg, 1 mg, Oral, Daily,  Willaim Sheng, MD, 1 mg at 06/05/21 8502   HYDROcodone-acetaminophen (NORCO/VICODIN) 5-325 MG per tablet 1 tablet, 1 tablet, Oral, Q6H PRN, Willaim Sheng, MD, 1 tablet at 06/05/21 1504   HYDROmorphone (DILAUDID) injection 0.5 mg, 0.5 mg, Intravenous, Q4H PRN, Willaim Sheng, MD   insulin aspart (novoLOG) injection 0-9 Units, 0-9 Units, Subcutaneous, Q4H, Willaim Sheng, MD, 2 Units at 06/06/21 0421   multivitamin (RENA-VIT) tablet 1 tablet, 1 tablet, Oral, QHS, Samtani, Jai-Gurmukh, MD, 1 tablet at 06/05/21 2113   polyethylene glycol (MIRALAX / GLYCOLAX) packet 17 g, 17 g, Oral, Daily PRN, Willaim Sheng, MD   predniSONE (DELTASONE) tablet 5 mg, 5 mg, Oral, Q breakfast, Willaim Sheng, MD, 5 mg at 06/05/21 7741   senna-docusate (Senokot-S) tablet 1 tablet, 1 tablet, Oral, BID PRN, Willaim Sheng, MD   sevelamer carbonate (RENVELA) tablet 800 mg, 800 mg, Oral, TID WC, Willaim Sheng, MD, 800 mg at 06/05/21 1831   Patients Current Diet:  Diet Order                  Diet renal/carb modified with fluid restriction Diet-HS Snack? Nothing; Fluid restriction: 1200 mL Fluid; Room service appropriate? Yes with Assist; Fluid consistency: Thin  Diet effective now                         Precautions / Restrictions Precautions Precautions: Posterior Hip Precaution Booklet Issued: Yes (comment) Precaution Comments: Reviewed with pt and wife Restrictions Weight Bearing Restrictions: Yes LLE Weight Bearing: Weight bearing as tolerated Other Position/Activity Restrictions: Posterior hip precautions    Has the patient had 2 or more falls or a fall with injury in the past year? Yes   Prior Activity Level Limited Community (1-2x/wk): Pt. went out for HD an appointments   Prior Functional Level Self Care: Did the patient need help bathing, dressing, using the toilet or eating? Needed some help  Indoor Mobility: Did the patient need assistance  with walking from room to room (with or without device)? Needed some help   Stairs: Did the patient need assistance with internal or external stairs (with or without device)? Needed some help   Functional Cognition: Did the patient need help planning regular tasks such as shopping or remembering to take medications? Dependent   Patient Information Are you of Hispanic, Latino/a,or Spanish origin?: A. No, not of Hispanic, Latino/a, or Spanish origin What is your race?: B. Black or African American Do you need or want an interpreter to communicate with a doctor or health care staff?: 0. No   Patient's Response To:  Health Literacy and Transportation Is the patient able to respond to health literacy and transportation needs?: No   Home Assistive Devices / Terminous Devices/Equipment: Environmental consultant (specify type), Bedside commode/3-in-1, Wheelchair, Prosthesis, Shower chair with back Home Equipment: Conservation officer, nature (2 wheels), Rollator (4 wheels), Cane - single point, Wheelchair - manual   Prior Device Use: Indicate devices/aids used by the patient prior to current illness, exacerbation or injury? Manual wheelchair and Walker   Current Functional Level Cognition   Overall Cognitive Status: History of cognitive impairments - at baseline Orientation Level: Oriented to person, Oriented to place General Comments: Alert to self, situation, unaware of date (month year). minimal verbalizations, all history provided by spouse    Extremity Assessment (includes Sensation/Coordination)   Upper Extremity Assessment: Generalized weakness  Lower Extremity Assessment: Defer to PT evaluation RLE Deficits / Details: Hx Lt BKA - wearing shrinker sock LLE Deficits / Details: Lt hip posterior precautions. LLE: Unable to fully assess due to pain     ADLs   Overall ADL's : Needs assistance/impaired Eating/Feeding: Minimal assistance Grooming: Maximal assistance Upper Body Bathing: Maximal  assistance Lower Body Bathing: Maximal assistance, +2 for physical assistance, Sit to/from stand Upper Body Dressing : Maximal assistance Lower Body Dressing: Maximal assistance, Sit to/from stand General ADL Comments: 3x sit<>stands from EOB, rest breaks incorporated. Able to tolerate standing up to 15 seconds, max A +2 (zero standing balance). Will need Hoyer for safe transfers in/out of bed at this time.     Mobility   Overal bed mobility: Needs Assistance Bed Mobility: Supine to Sit, Sit to Supine Supine to sit: Mod assist, +2 for physical assistance Sit to supine: Mod assist, +2 for physical assistance General bed mobility comments: Trunk assist and LLE support to rise and lower to/from EOB. Mod assist +2 using bed pad to scoot. Pt able to assist with trunk flexion and UEs which are pretty strong. Eventually showed posterior lean preference as he fatigued with slight pushing (wife reports baseline with anxiety.) Practiced scooting in bed with slight bridge once supine - required assist from therapist to maintain hip precautions on Lt - bed pad pulled to [facility] with max A +2 however pt able to pull with UEs when cued.     Transfers   Overall transfer level: Needs assistance Equipment used: Rolling walker (2 wheels) Transfers: Sit to/from Stand Sit to Stand: Max assist, +2 physical assistance, From elevated surface General transfer comment: Bed elevated Max assist +2 for boost to stand, difficulty fully extending LLE - no prosthesis available at initial eval however wife is going to bring for further follow-ups. VC for sequencing and technique. Tolerated 3 trials of standing, <15 sec, and ea stand progressively shorter duration. Started to push posteriorly. Attempted lateral pivot along bed, pt anxious and leaning backwards despite cues and support.  Ambulation / Gait / Stairs / Proofreader / Balance Dynamic Sitting Balance Sitting balance - Comments: As pt fatigued  he started leaning posteriorly but may have an anxiety component as well per wife at baseline when standing. Balance Overall balance assessment: Needs assistance Sitting-balance support: Bilateral upper extremity supported Sitting balance-Leahy Scale: Fair Sitting balance - Comments: As pt fatigued he started leaning posteriorly but may have an anxiety component as well per wife at baseline when standing. Postural control: Posterior lean Standing balance support: Bilateral upper extremity supported Standing balance-Leahy Scale: Zero Standing balance comment: max assist to remain standing, able to bear weight through LLE pretty well and BIL hands on RW.     Special needs/care consideration Skin L post op hip dressing, Diabetic management Yes DM, and Special service needs ESRD HD M-W-F    Previous Home Environment (from acute therapy documentation) Living Arrangements: Spouse/significant other, Children, Other relatives Available Help at Discharge: Family, Available PRN/intermittently (wife works) Type of Home: [place]: Two level, Bed/bath upstairs, 1/2 bath on main level, Able to live on main level with bedroom/bathroom Alternate Level Stairs-Rails: Right Alternate Level Stairs-Number of Steps: 12 Home Access: Stairs to enter Entrance Stairs-Rails: None Entrance Stairs-Number of Steps: 1 Bathroom Shower/Tub: Chiropodist: [place]: No Additional Comments: history provided by wife - [facility] MWF through [facility]   Discharge Living Setting Plans for Discharge Living Setting: Patient's home Type of Home at Discharge: House Discharge Home Layout: Two level, Bed/bath upstairs, Able to live on main level with bedroom/bathroom Alternate Level Stairs-Rails: Right Alternate Level Stairs-Number of Steps: 12 Discharge Home Access: Stairs to enter Entrance Stairs-Rails: None Entrance Stairs-Number of Steps: 1 Discharge Bathroom Shower/Tub:  Tub/shower unit Discharge Bathroom Toilet: Standard Discharge Bathroom Accessibility: Yes How Accessible: Accessible via walker Does the patient have any problems obtaining your medications?: No   Social/Family/Support Systems Patient Roles: Spouse Contact Information: [phone] Anticipated Caregiver: Thayden Lemire Anticipated Caregiver's Contact Information: [phone] Ability/Limitations of Caregiver: Can provide Mod A Caregiver Availability: 24/7 Discharge Plan Discussed with Primary Caregiver: Yes Is Caregiver In Agreement with Plan?: Yes Does Caregiver/Family have Issues with Lodging/Transportation while Pt is in Rehab?: No   Goals Patient/Family Goal for Rehab: PT/OT Mod A Expected length of stay: 18-22 days Pt/Family Agrees to Admission and willing to participate: Yes Program Orientation Provided & Reviewed with Pt/Caregiver Including Roles  & Responsibilities: Yes   Decrease burden of Care through IP rehab admission: Specialzed equipment needs, Decrease number of caregivers, Bowel and bladder program, and Patient/family education   Possible need for SNF placement upon discharge: not anticipated   Patient Condition: I have reviewed medical records from [facility], spoken with CM, and patient and spouse. I met with patient at the bedside for inpatient rehabilitation assessment.  Patient will benefit from ongoing PT and OT, can actively participate in 3 hours of therapy a day 5 days of the week, and can make measurable gains during the admission.  Patient will also benefit from the coordinated team approach during an Inpatient Acute Rehabilitation admission.  The patient will receive intensive therapy as well as Rehabilitation physician, nursing, social worker, and care management interventions.  Due to bladder management, bowel management, safety, skin/wound care, disease management, medication administration, and patient education the patient requires 24 hour  a day rehabilitation nursing.  The patient is currently mod to max assist with mobility and basic ADLs.  Discharge  setting and therapy post discharge at home with home health is anticipated.  Patient has agreed to participate in the Acute Inpatient Rehabilitation Program and will admit today.   Preadmission Screen Completed By:  Genella Mech, 06/06/2021 9:45 AM and updated today by Jodell Cipro, RN ______________________________________________________________________   Discussed status with Dr. Courtney [name] on 06/10/21 at 10:00 am and received approval for admission today.   Admission Coordinator:  Genella Mech, CCC-SLP, time 1024/Date 06/10/21    Assessment/Plan: Diagnosis: Does the need for close, 24 hr/day Medical supervision in concert with the patient's rehab needs make it unreasonable for this patient to be served in a less intensive setting? Yes Co-Morbidities requiring supervision/potential complications: demtnia- vascular; ESRD on HD; R BKA 3/22; L hip hemiarthroplasty Due to bladder management, bowel management, safety, skin/wound care, disease management, medication administration, pain management, and patient education, does the patient require 24 hr/day rehab nursing? Yes Does the patient require coordinated care of a physician, rehab nurse, PT, OT, and SLP to address physical and functional deficits in the context of the above medical diagnosis(es)? Yes Addressing deficits in the following areas: balance, endurance, locomotion, strength, transferring, bowel/bladder control, bathing, dressing, feeding, grooming, and toileting Can the patient actively participate in an intensive therapy program of at least 3 hrs of therapy 5 days a week? Yes The potential for patient to make measurable gains while on inpatient rehab is good and fair Anticipated functional outcomes upon discharge from inpatient rehab: min assist and mod assist PT, min assist and mod assist OT, n/a SLP Estimated rehab  length of stay to reach the above functional goals is: 18-22 days Anticipated discharge destination: Home 10. Overall Rehab/Functional Prognosis: good and fair     MD Signature:           Revision History                                              Note Details  Jan Fireman, MD File Time 06/10/2021 12:37 PM  Author Type Physician Status Signed  Last Editor Jonathan Heys, MD Service CASE MANAGEMENT

## 2021-06-10 NOTE — Progress Notes (Signed)
[facility] Progress Note   Subjective: seen in room. S/P normal F'gram 06/08/2021  Objective Vitals:   06/09/21 1507 06/09/21 1551 06/09/21 2131 06/10/21 0811  BP: 134/65 126/65 138/69 137/64  Pulse: 86 82 98 99  Resp: 16 18 16    Temp:  98.8 F (37.1 C) 98.7 F (37.1 C) 98.8 F (37.1 C)  TempSrc:  Oral Oral Oral  SpO2:  100% 100% 100%  Weight:      Height:       Physical Exam General: Older male with flat affect, NAD Heart:S1,S2 no M/R/G Lungs: CTAB A/P  No WOB Abdomen: NABS, NT Extremities: R BKA no stump edema. LLE without edema.  Dialysis Access: R AVF +T/B mild edema.    Additional Objective Labs: Basic Metabolic Panel: Recent Labs  Lab 06/07/21 0740 06/08/21 0219 06/09/21 0217  NA 134* 133* 135  K 4.8 4.5 5.1  CL 94* 97* 96*  CO2 27 25 29   GLUCOSE 98 286* 180*  BUN 92* 57* 77*  CREATININE 9.42* 6.62* 8.02*  CALCIUM 8.4* 8.0* 8.5*  PHOS 5.3* 4.2 3.8    Liver Function Tests: Recent Labs  Lab 06/05/21 0320 06/06/21 0259 06/07/21 0740 06/08/21 0219 06/09/21 0217  AST 18  --   --   --   --   ALT 12  --   --   --   --   ALKPHOS 50  --   --   --   --   BILITOT 0.6  --   --   --   --   PROT 5.5*  --   --   --   --   ALBUMIN 2.8*   < > 2.4* 2.5* 2.4*   < > = values in this interval not displayed.    No results for input(s): LIPASE, AMYLASE in the last 168 hours. CBC: Recent Labs  Lab 06/06/21 0259 06/06/21 1426 06/07/21 0216 06/07/21 0748 06/08/21 0219 06/09/21 0217  WBC 7.9  --  8.1 7.7 6.3 7.2  NEUTROABS 6.0  --  6.3  --  4.8  --   HGB 6.4*   < > 7.2* 7.1* 8.3* 7.2*  HCT 18.9*   < > 21.9* 21.5* 24.6* 22.2*  MCV 95.9  --  94.0 94.3 89.1 90.2  PLT 144*  --  142* 154 170 177   < > = values in this interval not displayed.    Blood Culture    Component Value Date/Time   SDES BLOOD SITE NOT SPECIFIED 10/30/2020 1307   SPECREQUEST  10/30/2020 1307    BOTTLES DRAWN AEROBIC ONLY Blood Culture results may not be optimal  due to an inadequate volume of blood received in culture bottles   CULT  10/30/2020 1307    NO GROWTH 5 DAYS Performed at [facility], [place] [address], [place], [place] [address]    REPTSTATUS 11/04/2020 FINAL 10/30/2020 1307    Cardiac Enzymes: No results for input(s): CKTOTAL, CKMB, CKMBINDEX, TROPONINI in the last 168 hours. CBG: Recent Labs  Lab 06/09/21 1114 06/09/21 1641 06/09/21 2131 06/10/21 0001 06/10/21 0807  GLUCAP 126* 101* 142* 123* 80    Iron Studies: No results for input(s): IRON, TIBC, TRANSFERRIN, FERRITIN in the last 72 hours. @lablastinr3 @ Studies/Results: No results found. Medications:    sodium chloride   Intravenous Once   allopurinol  100 mg Oral QHS   apixaban  5 mg Oral BID   atorvastatin  40 mg Oral QHS   cefadroxil  500 mg Oral Q36H   Chlorhexidine Gluconate Cloth  6 each Topical Q0600   darbepoetin (ARANESP) injection - DIALYSIS  100 mcg Intravenous Q Wed-HD   feeding supplement  237 mL Oral TID BM   feeding supplement (NEPRO CARB STEADY)  237 mL Oral BID BM   folic acid  1 mg Oral Daily   insulin aspart  0-9 Units Subcutaneous Q4H   multivitamin  1 tablet Oral QHS   predniSONE  5 mg Oral Q breakfast   sevelamer carbonate  800 mg Oral TID WC     OP HD: [facility] MWF  4h 46min  400/1.5  61.5kg   2/2 bath  AVF 15g  Heparin 3000 Calcitriol 1.42mcg PO q HD   Assessment/Plan  Left femoral neck fracture: Orthopedic surgery status post 10/28 surgery  per ortho  ESRD:  Dialysis on MWF schedule HD today on schedule.  Right upper arm/AV fistula swelling: IR consulted to evaluate.  Went for F'gram 11/1. No intervention.  Hypertension/volume: No evidence of volume excess by exam. BP controlled. UF as tolerated.   Anemia: HGB 8.3 S/P 1 unit of PRBCs 06/07/2021 now S/P 2 units PRBCs since adm. HGB 7.2 today, rec'd 1 unit PRBCs today. Give Aranesp with HD today.  Follow HGB.   Metabolic bone disease: Ca+/Po4 at goal. continue calcitriol and binders.    Nutrition: ALB 2.4 protein supplement to diet, renal/carb modified CAD/a.fib/hx of LV thrombus: cardiology consulted  cleared for hip surgery . 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

## 2021-06-10 NOTE — Progress Notes (Signed)
PT Cancellation Note  Patient Details Name: Jonathan WYNN Sr. MRN: [id number] DOB: 06-Apr-1951   Cancelled Treatment:    Reason Eval/Treat Not Completed: Other (comment).  Three attempts to see pt, in which personal care and meals were going on.  Follow up as time and pt allow.   Ramond Dial 06/10/2021, 3:40 PM  Mee Hives, PT PhD Acute Rehab Dept. Number: [place] [facility] [place]

## 2021-06-11 ENCOUNTER — Other Ambulatory Visit: Payer: Self-pay

## 2021-06-11 ENCOUNTER — Ambulatory Visit: Payer: Medicare Other | Admitting: Interventional Cardiology

## 2021-06-11 DIAGNOSIS — Z992 Dependence on renal dialysis: Secondary | ICD-10-CM

## 2021-06-11 DIAGNOSIS — F0150 Vascular dementia without behavioral disturbance: Secondary | ICD-10-CM

## 2021-06-11 LAB — CBC
HCT: 30.4 % — ABNORMAL LOW (ref 39.0–52.0)
Hemoglobin: 9.8 g/dL — ABNORMAL LOW (ref 13.0–17.0)
MCH: 30 pg (ref 26.0–34.0)
MCHC: 32.2 g/dL (ref 30.0–36.0)
MCV: 93 fL (ref 80.0–100.0)
Platelets: 248 10*3/uL (ref 150–400)
RBC: 3.27 MIL/uL — ABNORMAL LOW (ref 4.22–5.81)
RDW: 18.7 % — ABNORMAL HIGH (ref 11.5–15.5)
WBC: 8.6 10*3/uL (ref 4.0–10.5)
nRBC: 0 % (ref 0.0–0.2)

## 2021-06-11 LAB — GLUCOSE, CAPILLARY
Glucose-Capillary: 75 mg/dL (ref 70–99)
Glucose-Capillary: 160 mg/dL — ABNORMAL HIGH (ref 70–99)
Glucose-Capillary: 198 mg/dL — ABNORMAL HIGH (ref 70–99)
Glucose-Capillary: 264 mg/dL — ABNORMAL HIGH (ref 70–99)

## 2021-06-11 LAB — RENAL FUNCTION PANEL
Albumin: 2.4 g/dL — ABNORMAL LOW (ref 3.5–5.0)
Anion gap: 11 (ref 5–15)
BUN: 76 mg/dL — ABNORMAL HIGH (ref 8–23)
CO2: 29 mmol/L (ref 22–32)
Calcium: 9 mg/dL (ref 8.9–10.3)
Chloride: 97 mmol/L — ABNORMAL LOW (ref 98–111)
Creatinine, Ser: 7.58 mg/dL — ABNORMAL HIGH (ref 0.61–1.24)
GFR, Estimated: 7 mL/min — ABNORMAL LOW (ref >60)
Glucose, Bld: 67 mg/dL — ABNORMAL LOW (ref 70–99)
Phosphorus: 4.5 mg/dL (ref 2.5–4.6)
Potassium: 5.2 mmol/L — ABNORMAL HIGH (ref 3.5–5.1)
Sodium: 137 mmol/L (ref 135–145)

## 2021-06-11 MED ORDER — DARBEPOETIN ALFA 100 MCG/0.5ML IJ SOSY
100.0000 ug | PREFILLED_SYRINGE | INTRAMUSCULAR | Status: DC
Start: 2021-06-14 — End: 2021-06-21
  Administered 2021-06-14: 100 ug via INTRAVENOUS
  Filled 2021-06-11 (×3): qty 0.5

## 2021-06-11 MED ORDER — HEPARIN SODIUM (PORCINE) 1000 UNIT/ML IJ SOLN: 3000.0000 [IU] | Freq: Once | INTRAMUSCULAR | Status: DC

## 2021-06-11 NOTE — Progress Notes (Signed)
Inpatient Rehabilitation  Patient information reviewed and entered into eRehab system by Mauria Asquith M. Garen Woolbright, M.A., CCC/SLP, PPS Coordinator.  Information including medical coding, functional ability and quality indicators will be reviewed and updated through discharge.    

## 2021-06-11 NOTE — Plan of Care (Signed)
  Problem: Sit to Stand Goal: LTG:  Patient will perform sit to stand in prep for activites of daily living with assistance level (OT) Description: LTG:  Patient will perform sit to stand in prep for activites of daily living with assistance level (OT) Flowsheets (Taken 06/11/2021 1347) LTG: PT will perform sit to stand in prep for activites of daily living with assistance level: Moderate Assistance - Patient 50 - 74%   Problem: RH Eating Goal: LTG Patient will perform eating w/assist, cues/equip (OT) Description: LTG: Patient will perform eating with assist, with/without cues using equipment (OT) Flowsheets (Taken 06/11/2021 1347) LTG: Pt will perform eating with assistance level of: Minimal Assistance - Patient > 75%   Problem: RH Bathing Goal: LTG Patient will bathe all body parts with assist levels (OT) Description: LTG: Patient will bathe all body parts with assist levels (OT) Flowsheets (Taken 06/11/2021 1347) LTG: Pt will perform bathing with assistance level/cueing: Maximal Assistance - Patient 25 - 49%   Problem: RH Toilet Transfers Goal: LTG Patient will perform toilet transfers w/assist (OT) Description: LTG: Patient will perform toilet transfers with assist, with/without cues using equipment (OT) Flowsheets (Taken 06/11/2021 1347) LTG: Pt will perform toilet transfers with assistance level of: Moderate Assistance - Patient 50 - 74%

## 2021-06-11 NOTE — Evaluation (Signed)
Physical Therapy Assessment and Plan  Patient Details  Name: Jonathan [name] Sr. MRN: [id number] Date of Birth: January 12, 1951  PT Diagnosis: Abnormality of gait, Cognitive deficits, Difficulty walking, Impaired cognition, Muscle weakness, and Pain in L hip Rehab Potential: Fair ELOS: 7-10 days   Today's Date: 06/11/2021 PT Individual Time: 1583-0940 PT Individual Time Calculation (min): 28 min    Hospital Problem: Principal Problem:   Closed left subtrochanteric femur fracture, sequela Active Problems:   ESRD (end stage renal disease) on dialysis [facility])   Vascular dementia without behavioral disturbance ([name])   Hx of right BKA ([place])   Past Medical History:  Past Medical History:  Diagnosis Date   CAD (coronary artery disease)    CHF (congestive heart failure) ([name])    Diabetes mellitus without complication ([name])    type 2   DM (diabetes mellitus) ([place])    ESRD (end stage renal disease) ([place])    FUO (fever of unknown origin) 09/17/2019   Hyperlipidemia    Hypertension    Osteomyelitis ([place])    Peripheral arterial disease ([name])    PVD (peripheral vascular disease) ([place])    Renal disorder 2015   right kidney transplant   Stroke [facility])    Vascular dementia [facility])    Past Surgical History:  Past Surgical History:  Procedure Laterality Date   ABDOMINAL AORTOGRAM N/A 08/05/2020   Procedure: ABDOMINAL AORTOGRAM;  Surgeon: [name], MD;  Location: [place] CV LAB;  Service: Cardiovascular;  Laterality: N/A;   ABDOMINAL AORTOGRAM W/LOWER EXTREMITY N/A 09/09/2020   Procedure: ABDOMINAL AORTOGRAM W/LOWER EXTREMITY;  Surgeon: [name], MD;  Location: [place] CV LAB;  Service: Cardiovascular;  Laterality: N/A;   AMPUTATION Right 08/06/2020   Procedure: Right Fifth Toe Ray Amputation;  Surgeon: [name], MD;  Location: [place];  Service: Vascular;  Laterality: Right;   AMPUTATION Right 11/02/2020   Procedure: RIGHT AMPUTATION BELOW KNEE;  Surgeon: [name], MD;  Location: [place];  Service: Vascular;  Laterality: Right;   BACK SURGERY     Has had 2 back surgeries   BUBBLE STUDY  09/18/2019   Procedure: BUBBLE STUDY;  Surgeon: Buford Dresser, MD;  Location: [place];  Service: Cardiovascular;;   Depression     ENDARTERECTOMY FEMORAL Right 08/06/2020   Procedure: Right Ilio- Femoral Artery Endarterectomy;  Surgeon: [name], MD;  Location: [facility] OR;  Service: Vascular;  Laterality: Right;   HAND SURGERY Right    HAND SURGERY     HIP ARTHROPLASTY Left 06/04/2021   Procedure: ARTHROPLASTY BIPOLAR HIP (HEMIARTHROPLASTY);  Surgeon: Willaim Sheng, MD;  Location: [place];  Service: Orthopedics;  Laterality: Left;   IR DIALY SHUNT INTRO NEEDLE/INTRACATH INITIAL W/IMG RIGHT Right 06/07/2021   IR REMOVAL TUN CV CATH W/O FL  09/03/2019   KIDNEY TRANSPLANT     November 2015   LEG AMPUTATION BELOW KNEE Right    LOWER EXTREMITY ANGIOGRAPHY Bilateral 08/05/2020   Procedure: LOWER EXTREMITY ANGIOGRAPHY;  Surgeon: [name], MD;  Location: [place] CV LAB;  Service: Cardiovascular;  Laterality: Bilateral;   Memory loss     NEPHRECTOMY TRANSPLANTED ORGAN     PATCH ANGIOPLASTY Right 08/06/2020   Procedure: PATCH ANGIOPLASTY;  Surgeon: [name], MD;  Location: [facility] OR;  Service: Vascular;  Laterality: Right;   PERIPHERAL VASCULAR BALLOON ANGIOPLASTY Right 09/09/2020   Procedure: PERIPHERAL VASCULAR BALLOON ANGIOPLASTY;  Surgeon: [name], MD;  Location: [place] CV LAB;  Service: Cardiovascular;  Laterality: Right;  popliteal   PERIPHERAL VASCULAR INTERVENTION Right 08/05/2020   Procedure: PERIPHERAL VASCULAR INTERVENTION;  Surgeon: [name], MD;  Location: [place] CV LAB;  Service: Cardiovascular;  Laterality: Right;  SFA   PERIPHERAL VASCULAR INTERVENTION Right 09/09/2020   Procedure: PERIPHERAL VASCULAR INTERVENTION;  Surgeon: [name], MD;  Location: [place] CV LAB;  Service:  Cardiovascular;  Laterality: Right;  external iliac   TEE WITHOUT CARDIOVERSION N/A 09/18/2019   Procedure: TRANSESOPHAGEAL ECHOCARDIOGRAM (TEE);  Surgeon: Buford Dresser, MD;  Location: [facility] ENDOSCOPY;  Service: Cardiovascular;  Laterality: N/A;   WOUND DEBRIDEMENT Right 10/08/2020   Procedure: DEBRIDEMENT WOUND RIGHT FOOT;  Surgeon: [name], MD;  Location: [facility] OR;  Service: Vascular;  Laterality: Right;    Assessment & Plan Clinical Impression: Patient is a 70 y.o. year old male with  history of T2DM, CAD, ICM w/chronic systolic CHF, ESRD s/p failed renal transplant -HD MWF, recurrent LV thrombus, PAF, CVA, vascular dementia, R-BKA who was admitted on 06/03/21 after falling while attempting to use his prosthesis. He had immediate onset of pain with difficulty walking due to left displaced femoral neck fracture. He was evaluated by Dr. Zachery Dakins, cleared for surgery by Dr. Marcelle Smiling and underwent left hip hemi on 06/04/21. Post op WBAT with hip precautions, was bridged with heparin and slowly transitioned back to Eliquis 5 mg bid.    Cefadroxil bid X 7 days recommended by ortho due to given multiple comorbidities and increased risk for post op infection.  He did have drop in Hgb to 6.4 on 10/30 and was transfused with one unit PRBC.  IR consulted for fistula evaluation due to ongoing issues with RUE swelling of few weeks duration and fistulagram performed without intervention on 10/31. He continues to have limitation in mobility and ADLs due to pian, decreased    Per family member, pt was using his R BKA prosthesis to walk at home for 2 weeks prior to fall.   Patient currently requires total with mobility secondary to muscle weakness, decreased cardiorespiratoy endurance, decreased coordination and decreased motor planning, decreased visual perceptual skills, decreased motor planning, decreased initiation, decreased attention, decreased awareness, decreased problem solving, decreased  safety awareness, decreased memory, and delayed processing, and decreased balance strategies and difficulty maintaining precautions.  Prior to hospitalization, patient was supervision with mobility and lived with Spouse in a House home.  Home access is 1Stairs to enter.  Patient will benefit from skilled PT intervention to maximize safe functional mobility, minimize fall risk, and decrease caregiver burden for planned discharge home with intermittent assist.  Anticipate patient will benefit from follow up [facility] at discharge.  PT - End of Session Activity Tolerance: Tolerates < 10 min activity, no significant change in vital signs Endurance Deficit: Yes PT Assessment Rehab Potential (ACUTE/IP ONLY): Fair PT Barriers to Discharge: [place] home environment;Decreased caregiver support;Home environment access/layout;Wound Care;Hemodialysis;Weight bearing restrictions;Behavior PT Barriers to Discharge Comments: Pt refused all PT intervention and became agitated throughout session. Pt has cognitive impairment at baseline PT Patient demonstrates impairments in the following area(s): Behavior;Endurance;Motor;Perception;Safety PT Transfers Functional Problem(s): Bed Mobility;Bed to Chair;Car;Furniture PT Locomotion Functional Problem(s): Ambulation;Wheelchair Mobility;Stairs PT Plan PT Intensity: Minimum of 1-2 x/day ,45 to 90 minutes PT Frequency: 5 out of 7 days PT Duration Estimated Length of Stay: 7-10 days PT Treatment/Interventions: Ambulation/gait training;DME/adaptive equipment instruction;Psychosocial support;UE/LE Strength taining/ROM;Balance/vestibular training;Functional electrical stimulation;Skin care/wound management;UE/LE Coordination activities;Cognitive remediation/compensation;Functional mobility training;Splinting/orthotics;Visual/perceptual remediation/compensation;Community reintegration;Neuromuscular re-education;Stair training;Wheelchair propulsion/positioning;Discharge  planning;Pain management;Therapeutic Activities;Disease management/prevention;Patient/family  education;Therapeutic Exercise PT Transfers Anticipated Outcome(s): Mod A PT Locomotion Anticipated Outcome(s): Mod A PT Recommendation Recommendations for Other Services: Neuropsych consult Follow Up Recommendations: Home health PT;Skilled nursing facility Patient destination: Home Equipment Recommended: To be determined   PT Evaluation Precautions/Restrictions Precautions Precautions: Posterior Hip;Fall Precaution Comments: provided precaution handouts Required Braces or Orthoses: Other Brace Other Brace: right prosthesis Restrictions Weight Bearing Restrictions: Yes RLE Weight Bearing: Weight bearing as tolerated LLE Weight Bearing: Weight bearing as tolerated Other Position/Activity Restrictions: Posterior hip precautions Pain Interference Pain Interference Pain Effect on Sleep: 1. Rarely or not at all Pain Interference with Therapy Activities: 1. Rarely or not at all Pain Interference with Day-to-Day Activities: 1. Rarely or not at all Home Living/Prior [place] expects to be discharged to:: Private residence Living Arrangements: Spouse/significant other;Children Available Help at Discharge: Family;Available PRN/intermittently (Wife works 3rd shift) Type of Home: House Home Access: Stairs to enter [employer] of Steps: 1 Entrance Stairs-Rails: None Home Layout: Two level;Bed/bath upstairs;1/2 bath on main level;Able to live on main level with bedroom/bathroom Alternate Level Stairs-Number of Steps: 12 Alternate Level Stairs-Rails: Right Bathroom Shower/Tub: Chiropodist: Standard Additional Comments: dialysis MWF - [facility] transportation  Lives With: Spouse Prior Function Level of Independence: Needs assistance with ADLs;Needs assistance with tranfers;Needs assistance with gait Bath: Maximal Toileting:  Maximal Dressing: Maximal Grooming: Maximal  Able to Take Stairs?: Yes Driving: Yes Vision/Perception  Vision - History Ability to See in Adequate Light: 0 Adequate Vision - Assessment Additional Comments: no apparent deficits, but pt unwilling to participate - "I can see pretty good" Perception Perception: Not tested Praxis Praxis: Not tested  Cognition Overall Cognitive Status: History of cognitive impairments - at baseline Arousal/Alertness: Lethargic Orientation Level: Disoriented X4 Year:  (2006) Month: April Day of Week: Incorrect (thursday) Memory: Impaired Memory Impairment: Storage deficit;Retrieval deficit;Decreased recall of new information;Decreased short term memory;Decreased long term memory (Pt stated "the doctor just looked at my hip, I didnt have surgery!") Immediate Memory Recall: Sock;Blue;Bed Memory Recall Sock: Not able to recall ("I dont want to") Memory Recall Blue: Not able to recall ("I dont want to") Memory Recall Bed: Not able to recall ("I dont want to") Awareness: Impaired Problem Solving: Impaired Behaviors: Verbal agitation;Poor frustration tolerance Safety/Judgment: Impaired Comments: Difficult to communicate Sensation Sensation Light Touch: Not tested Additional Comments: pt unwilling to allow me to touch him to do any sensory testing and would not answer questions clearly Coordination Gross Motor Movements are Fluid and Coordinated: Not tested Fine Motor Movements are Fluid and Coordinated: Not tested Motor  Motor Motor: Other (comment) Motor - Skilled Clinical Observations: Pt refused all intervention and assessment - repeatedly stated he was going to "get caged in" and became agitated   Trunk/Postural Assessment  Cervical Assessment Cervical Assessment:  (Unable to assess in supine) Thoracic Assessment Thoracic Assessment:  (Unable to assess in supine) Lumbar Assessment Lumbar Assessment:  (Unable to assess in supine) Postural  Control Postural Control:  (Unable to assess in supine)  Balance Balance Balance Assessed: No (Pt refused) Extremity Assessment  RUE Assessment RUE Assessment: Not tested General Strength Comments: pt refused all intervention and assessment - kept saying "i dont want to, no" would not answer questions as to why LUE Assessment LUE Assessment: Not tested General Strength Comments: pt refused all intervention and assessment - kept saying "i dont want to, no" would not answer questions as to why RLE Assessment RLE Assessment: Not tested (Pt refused) LLE Assessment LLE Assessment: Not  tested (Pt refused)  Care Tool Care Tool Bed Mobility Roll left and right activity Roll left and right activity did not occur: Refused      Sit to lying activity Sit to lying activity did not occur: Refused      Lying to sitting on side of bed activity Lying to sitting on side of bed activity did not occur: the ability to move from lying on the back to sitting on the side of the bed with no back support.: Refused       Care Tool Transfers Sit to stand transfer Sit to stand activity did not occur: Refused      Chair/bed transfer Chair/bed transfer activity did not occur: Producer, television/film/video transfer activity did not occur: Social worker transfer activity did not occur: Refused        Care Tool Locomotion Ambulation Ambulation activity did not occur: Refused        Walk 10 feet activity Walk 10 feet activity did not occur: Refused       Walk 50 feet with 2 turns activity Walk 50 feet with 2 turns activity did not occur: Refused      Walk 150 feet activity Walk 150 feet activity did not occur: Refused      Walk 10 feet on uneven surfaces activity Walk 10 feet on uneven surfaces activity did not occur: Refused      Stairs Stair activity did not occur: Refused        Walk up/down 1 step activity Walk up/down 1 step or curb (drop down) activity did  not occur: Refused      Walk up/down 4 steps activity Walk up/down 4 steps activity did not occur: Refused      Walk up/down 12 steps activity Walk up/down 12 steps activity did not occur: Refused      Pick up small objects from floor Pick up small object from the floor (from standing position) activity did not occur: Refused      Wheelchair Is the patient using a wheelchair?: Yes Type of Wheelchair: Manual Wheelchair activity did not occur: Refused      Wheel 50 feet with 2 turns activity Wheelchair 50 feet with 2 turns activity did not occur: Refused    Wheel 150 feet activity Wheelchair 150 feet activity did not occur: Refused      Refer to Care Plan for Long Term Goals  SHORT TERM GOAL WEEK 1 PT Short Term Goal 1 (Week 1): STG = LTG due to ELOS  Recommendations for other services: Neuropsych  Skilled Therapeutic Intervention Evaluation completed (see details above and below) with education on PT POC, CIR safety policies, 3hr rehab requirement and goals. Pt received supine in bed, denied pain but was not agreeable to PT. Pt appeared scared/confused w/therapist in room. Attempted to explain therapist's role, pt repeatedly said "no" and continuously chattered teeth throughout session. Pt became agitated w/simple questions and refused all intervention. Attempted to convince pt to sit EOB, but he became agitated and shouted "no, you will cage me in". Attempted to soothe pt, but he began to "moo" at therapist. Upon speaking w/nursing, pt has been uncooperative all day. Unable to assess mobility at this time, safety plan updated to use lift for safety. Pt was left supine in bed, all needs in reach.   Mobility Bed Mobility Bed Mobility: Not assessed (Pt refused all intervention) Transfers Transfers:  (Pt  refused) Locomotion  Gait Ambulation: No (Pt refused) Gait Gait: No (Pt refused) Stairs / Additional Locomotion Stairs: No (Pt refused) Wheelchair Mobility Wheelchair  Mobility: No (Pt refused)   Discharge Criteria: Patient will be discharged from PT if patient refuses treatment 3 consecutive times without medical reason, if treatment goals not met, if there is a change in medical status, if patient makes no progress towards goals or if patient is discharged from hospital.  The above assessment, treatment plan, treatment alternatives and goals were discussed and mutually agreed upon: No family available/patient unable  [name] Edric Fetterman, PT, DPT 06/11/2021, 3:16 PM

## 2021-06-11 NOTE — Progress Notes (Signed)
Inpatient Rehabilitation Admission Medication Review by a Pharmacist  A complete drug regimen review was completed for this patient to identify any potential clinically significant medication issues.  High Risk Drug Classes Is patient taking? Indication by Medication  Antipsychotic Yes Compazine for N/V  Anticoagulant Yes Eliquis for PAF  Antibiotic No   Opioid Yes Vicodin for pain  Antiplatelet Yes Aspirin for PAF  Hypoglycemics/insulin Yes SSI for DM  Vasoactive Medication No   Chemotherapy No   Other No      Type of Medication Issue Identified Description of Issue Recommendation(s)  Drug Interaction(s) (clinically significant)     Duplicate Therapy     Allergy     No Medication Administration End Date     Incorrect Dose     Additional Drug Therapy Needed     Significant med changes from prior encounter (inform family/care partners about these prior to discharge).    Other       Clinically significant medication issues were identified that warrant physician communication and completion of prescribed/recommended actions by midnight of the next day:  No   Pharmacist comments: Aricept not yet started  Time spent performing this drug regimen review (minutes):  20 minutes   Tad Moore 06/11/2021 8:04 AM

## 2021-06-11 NOTE — Evaluation (Signed)
Occupational Therapy Assessment and Plan  Patient Details  Name: Jonathan HUSKINS Sr. MRN: [id number] Date of Birth: Jun 15, 1951  OT Diagnosis: acute pain, cognitive deficits, and muscle weakness (generalized) Rehab Potential: Rehab Potential (ACUTE ONLY): Poor ELOS: 7 days for family education ( will extend if pt starts to participate)   Today's Date: 06/11/2021 OT Individual Time: 1245-1315 OT Individual Time Calculation (min): 30 min     Hospital Problem: Principal Problem:   Closed left subtrochanteric femur fracture, sequela Active Problems:   ESRD (end stage renal disease) on dialysis [facility])   Vascular dementia without behavioral disturbance ([name])   Hx of right BKA ([place])   Past Medical History:  Past Medical History:  Diagnosis Date   CAD (coronary artery disease)    CHF (congestive heart failure) ([name])    Diabetes mellitus without complication ([place])    type 2   DM (diabetes mellitus) ([place])    ESRD (end stage renal disease) ([place])    FUO (fever of unknown origin) 09/17/2019   Hyperlipidemia    Hypertension    Osteomyelitis ([place])    Peripheral arterial disease ([name])    PVD (peripheral vascular disease) ([place])    Renal disorder 2015   right kidney transplant   Stroke [facility])    Vascular dementia [facility])    Past Surgical History:  Past Surgical History:  Procedure Laterality Date   ABDOMINAL AORTOGRAM N/A 08/05/2020   Procedure: ABDOMINAL AORTOGRAM;  Surgeon: [name], MD;  Location: [place] CV LAB;  Service: Cardiovascular;  Laterality: N/A;   ABDOMINAL AORTOGRAM W/LOWER EXTREMITY N/A 09/09/2020   Procedure: ABDOMINAL AORTOGRAM W/LOWER EXTREMITY;  Surgeon: [name], MD;  Location: [place] CV LAB;  Service: Cardiovascular;  Laterality: N/A;   AMPUTATION Right 08/06/2020   Procedure: Right Fifth Toe Ray Amputation;  Surgeon: [name], MD;  Location: [place];  Service: Vascular;  Laterality: Right;   AMPUTATION Right 11/02/2020   Procedure: RIGHT  AMPUTATION BELOW KNEE;  Surgeon: [name], MD;  Location: [place];  Service: Vascular;  Laterality: Right;   BACK SURGERY     Has had 2 back surgeries   BUBBLE STUDY  09/18/2019   Procedure: BUBBLE STUDY;  Surgeon: Buford Dresser, MD;  Location: [place];  Service: Cardiovascular;;   Depression     ENDARTERECTOMY FEMORAL Right 08/06/2020   Procedure: Right Ilio- Femoral Artery Endarterectomy;  Surgeon: [name], MD;  Location: [facility] OR;  Service: Vascular;  Laterality: Right;   HAND SURGERY Right    HAND SURGERY     HIP ARTHROPLASTY Left 06/04/2021   Procedure: ARTHROPLASTY BIPOLAR HIP (HEMIARTHROPLASTY);  Surgeon: Willaim Sheng, MD;  Location: [place];  Service: Orthopedics;  Laterality: Left;   IR DIALY SHUNT INTRO NEEDLE/INTRACATH INITIAL W/IMG RIGHT Right 06/07/2021   IR REMOVAL TUN CV CATH W/O FL  09/03/2019   KIDNEY TRANSPLANT     November 2015   LEG AMPUTATION BELOW KNEE Right    LOWER EXTREMITY ANGIOGRAPHY Bilateral 08/05/2020   Procedure: LOWER EXTREMITY ANGIOGRAPHY;  Surgeon: [name], MD;  Location: [place] CV LAB;  Service: Cardiovascular;  Laterality: Bilateral;   Memory loss     NEPHRECTOMY TRANSPLANTED ORGAN     PATCH ANGIOPLASTY Right 08/06/2020   Procedure: PATCH ANGIOPLASTY;  Surgeon: [name], MD;  Location: [place];  Service: Vascular;  Laterality: Right;   PERIPHERAL VASCULAR BALLOON ANGIOPLASTY Right 09/09/2020   Procedure: PERIPHERAL VASCULAR BALLOON ANGIOPLASTY;  Surgeon: [name],  MD;  Location: [place] CV LAB;  Service: Cardiovascular;  Laterality: Right;  popliteal   PERIPHERAL VASCULAR INTERVENTION Right 08/05/2020   Procedure: PERIPHERAL VASCULAR INTERVENTION;  Surgeon: [name], MD;  Location: [place] CV LAB;  Service: Cardiovascular;  Laterality: Right;  SFA   PERIPHERAL VASCULAR INTERVENTION Right 09/09/2020   Procedure: PERIPHERAL VASCULAR INTERVENTION;  Surgeon: [name], MD;   Location: [place] CV LAB;  Service: Cardiovascular;  Laterality: Right;  external iliac   TEE WITHOUT CARDIOVERSION N/A 09/18/2019   Procedure: TRANSESOPHAGEAL ECHOCARDIOGRAM (TEE);  Surgeon: Buford Dresser, MD;  Location: [place];  Service: Cardiovascular;  Laterality: N/A;   WOUND DEBRIDEMENT Right 10/08/2020   Procedure: DEBRIDEMENT WOUND RIGHT FOOT;  Surgeon: [name], MD;  Location: [facility] OR;  Service: Vascular;  Laterality: Right;    Assessment & Plan Clinical Impression: Jonathan [name] is a 70 year old male with history of T2DM, CAD, ICM w/chronic systolic CHF, ESRD s/p failed renal transplant -HD MWF, recurrent LV thrombus, PAF, CVA, vascular dementia, R-BKA who was admitted on 06/03/21 after falling while attempting to use his prosthesis. He had immediate onset of pain with difficulty walking due to left displaced femoral neck fracture. He was evaluated by Dr. Zachery Dakins, cleared for surgery by Dr. Marcelle Smiling and underwent left hip hemi on 06/04/21. Post op WBAT with hip precautions, was bridged with heparin and slowly transitioned back to Eliquis 5 mg bid.    Cefadroxil bid X 7 days recommended by ortho due to given multiple comorbidities and increased risk for post op infection.  He did have drop in Hgb to 6.4 on 10/30 and was transfused with one unit PRBC.  IR consulted for fistula evaluation due to ongoing issues with RUE swelling of few weeks duration and fistulagram performed without intervention on 10/31. He continues to have limitation in mobility and ADLs due to pian, decreased    Per family member, pt was using his R BKA prosthesis to walk at home for 2 weeks prior to fall.  Is oliguric- pees ~ 1x/day.  Also having a lot of diarrhea and groin/penis red.  Per pt, pain is "ok"- esp at rest.  Per wife, also pockets food and needs D2 diet.  Patient transferred to CIR on 06/10/2021 .    Patient currently requires total with basic self-care skills secondary to muscle  weakness, decreased cardiorespiratoy endurance, decreased attention, decreased awareness, decreased problem solving, decreased safety awareness, decreased memory, and delayed processing, and decreased sitting balance, decreased standing balance, decreased postural control, and decreased balance strategies.  Prior to hospitalization, patient could ambulated with his RW and prosthesis but his wife provide full assist with bathing and dressing.   Patient will benefit from skilled intervention to decrease level of assist with basic self-care skills prior to discharge home with care partner.  Anticipate patient will require moderate physical assestance and follow up home health.  OT - End of Session Activity Tolerance:  (pt unwilling to participate) OT Assessment Rehab Potential (ACUTE ONLY): Poor OT Barriers to Discharge: Behavior;Other (comments) OT Barriers to Discharge Comments: pt is not oriented, unaware he had hip surgery (did know he fell), unwilling to participate; baseline history of dementia OT Patient demonstrates impairments in the following area(s): Balance;Cognition;Endurance;Motor;Safety;Sensory (based on acute care notes) OT Basic ADL's Functional Problem(s): Eating;Grooming;Bathing;Dressing;Toileting OT Transfers Functional Problem(s): Toilet OT Additional Impairment(s): None OT Plan OT Intensity: Minimum of 1-2 x/day, 45 to 90 minutes OT Frequency: 5 out of 7 days OT Duration/Estimated  Length of Stay: 7 days for family education ( will extend if pt starts to participate) OT Treatment/Interventions: Training and development officer;Therapeutic Activities;Self Care/advanced ADL retraining;Functional mobility training;Patient/family education;Therapeutic Exercise;Psychosocial support;DME/adaptive equipment instruction;Discharge planning;Pain management OT Self Feeding Anticipated Outcome(s): min A OT Basic Self-Care Anticipated Outcome(s): max A OT Toileting Anticipated Outcome(s): total A  (wife will be trained on how to A) OT Bathroom Transfers Anticipated Outcome(s): BSC transfers only - mod A OT Recommendation Patient destination: Home Follow Up Recommendations: Home health OT Equipment Recommended: To be determined   OT Evaluation Precautions/Restrictions  Precautions Precautions: Posterior Hip Precaution Comments: provided precaution handouts Restrictions RLE Weight Bearing: Weight bearing as tolerated LLE Weight Bearing: Weight bearing as tolerated Other Position/Activity Restrictions: Posterior hip precautions    Vital Signs Therapy Vitals Temp: 97.7 F (36.5 C) Temp Source: Oral Pulse Rate: 84 Resp: (!) 22 BP: (!) 158/62 Patient Position (if appropriate): Lying Oxygen Therapy SpO2: 100 % O2 Device: Room Air Pain Pain Assessment Pain Scale: 0-10 Pain Score: 0-No pain Home Living/Prior Functioning Home Living Living Arrangements: Spouse/significant other, Children Available Help at Discharge: Family, Available PRN/intermittently Type of Home: House Home Access: Stairs to enter Technical brewer of Steps: 1 Entrance Stairs-Rails: None Home Layout: Two level, Bed/bath upstairs, 1/2 bath on main level, Able to live on main level with bedroom/bathroom Alternate Level Stairs-Number of Steps: 12 Alternate Level Stairs-Rails: Right Bathroom Shower/Tub: Tub/shower unit Additional Comments: dialysis MWF - [facility] transportation  Lives With: Spouse Prior Function Level of Independence: Needs assistance with ADLs Vision Baseline Vision/History: 1 Wears glasses Ability to See in Adequate Light: 0 Adequate Patient Visual Report: No change from baseline Additional Comments: no apparent deficits, but pt unwilling to participate - "I can see pretty good" Perception  Perception: Not tested Praxis Praxis: Not tested Cognition Overall Cognitive Status: History of cognitive impairments - at baseline Arousal/Alertness: Lethargic Orientation Level:  Person;Place (pt knew he was in the hospital, but thought he was in "[place]") Year:  (2006) Month: April Day of Week: Incorrect (thursday) Memory: Impaired Memory Impairment: Storage deficit;Retrieval deficit;Decreased recall of new information;Decreased short term memory;Decreased long term memory (Pt stated "the doctor just looked at my hip, I didnt have surgery!") Immediate Memory Recall: Sock;Blue;Bed Memory Recall Sock: Not able to recall ("I dont want to") Memory Recall Blue: Not able to recall ("I dont want to") Memory Recall Bed: Not able to recall ("I dont want to") Awareness: Impaired Problem Solving: Impaired Behaviors: Verbal agitation;Poor frustration tolerance Safety/Judgment: Impaired Comments: He got very irritated with basic questions. Sensation Sensation Additional Comments: pt unwilling to allow me to touch him to do any sensory testing and would not answer questions clearly Coordination Gross Motor Movements are Fluid and Coordinated: Not tested (pt refused to demonstrate his movement "go away") Fine Motor Movements are Fluid and Coordinated: Not tested Motor  Motor Motor - Skilled Clinical Observations: pt refused all intervention and assessment - kept saying "i dont want to, no" would not answer questions as to why  Trunk/Postural Assessment    Not assessed Balance Balance Balance Assessed: No (pt refused all intervention and assessment - kept saying "i dont want to, no" would not answer questions as to why) Extremity/Trunk Assessment RUE Assessment RUE Assessment: Not tested General Strength Comments: pt refused all intervention and assessment - kept saying "i dont want to, no" would not answer questions as to why LUE Assessment LUE Assessment: Not tested General Strength Comments: pt refused all intervention and assessment - kept saying "i  dont want to, no" would not answer questions as to why  Care Tool Care Tool Self  Care Eating Eating activity did not occur:  (unable to assess, as pt not eating at time of eval)      Oral Care  Oral care, brush teeth, clean dentures activity did not occur: Refused      Bathing Bathing activity did not occur: Refused            Upper Body Dressing(including orthotics) Upper body dressing/undressing activity did not occur (including orthotics): Refused          Lower Body Dressing (excluding footwear) Lower body dressing activity did not occur: Refused        Putting on/Taking off footwear Putting on/taking off footwear activity did not occur: Refused           Care Tool Toileting Toileting activity Toileting Activity did not occur (Clothing management and hygiene only): N/A (no void or bm)       Care Tool Bed Mobility Roll left and right activity Roll left and right activity did not occur: Refused      Sit to lying activity Sit to lying activity did not occur: Refused      Lying to sitting on side of bed activity Lying to sitting on side of bed activity did not occur: the ability to move from lying on the back to sitting on the side of the bed with no back support.: Refused       Care Tool Transfers Sit to stand transfer  Not evaluated      Chair/bed transfer Chair/bed transfer activity did not occur: Producer, television/film/video transfer activity did not occur: Refused       Care Tool Cognition  Expression of Ideas and Wants Expression of Ideas and Wants: 1. Rarely/Never expressess or very difficult - rarely/never expresses self or speech is very difficult to understand  Understanding Verbal and Non-Verbal Content Understanding Verbal and Non-Verbal Content: 1. Rarely/never understands   Memory/Recall Ability Memory/Recall Ability : That he or she is in a hospital/hospital unit   Refer to Care Plan for Long Term Goals  SHORT TERM GOAL WEEK 1 OT Short Term Goal 1 (Week 1): Pt will actively participate in therapy sessions at  least 70% of the time. OT Short Term Goal 2 (Week 1): Pt will demonstrate initiation by washing his face and UB with min A. OT Short Term Goal 3 (Week 1): Pt will demonstrate attention to task to self feed with min A.  Recommendations for other services: None    Skilled Therapeutic Intervention ADL ADL ADL Comments: Pt refused all intervention, kept saying over and over "I dont want to, go away"    Attempted to initiate evaluation with patient today. His wife was not present and this may have contributed to him not wanting to participate or even answer questions. He would answer simple questions of "is it just you and your wife in the home" or "do you have pets", but when asked about how he did things at home or how he was moving, about the surgery he became angry and yelled at me that I was annoying him. Attempted several approaches but pt would not let me even do a simple skin assessment.   It is the afternoon and he could be having sundowners syndrome, hopefully he will be able to participate better in the am hours.  Pt unable to understand reason for OT  or that he was even here for rehab.  Pt in bed with all needs met.   Discharge Criteria: Patient will be discharged from OT if patient refuses treatment 3 consecutive times without medical reason, if treatment goals not met, if there is a change in medical status, if patient makes no progress towards goals or if patient is discharged from hospital.  The above assessment, treatment plan, treatment alternatives and goals were discussed and mutually agreed upon: No family available/patient unable  [facility] 06/11/2021, 1:39 PM

## 2021-06-11 NOTE — Progress Notes (Signed)
Inpatient Rehabilitation Care Coordinator Assessment and Plan Patient Details  Name: Jonathan HANDLIN Sr. MRN: [id number] Date of Birth: [birth year]  Today's Date: 06/11/2021  Hospital Problems: Principal Problem:   Closed left subtrochanteric femur fracture, sequela Active Problems:   ESRD (end stage renal disease) on dialysis [facility])   Vascular dementia without behavioral disturbance ([name])   Hx of right BKA ([name])  Past Medical History:  Past Medical History:  Diagnosis Date   CAD (coronary artery disease)    CHF (congestive heart failure) ([name])    Diabetes mellitus without complication ([place])    type 2   DM (diabetes mellitus) ([place])    ESRD (end stage renal disease) ([place])    FUO (fever of unknown origin) 09/17/2019   Hyperlipidemia    Hypertension    Osteomyelitis ([place])    Peripheral arterial disease ([name])    PVD (peripheral vascular disease) ([place])    Renal disorder 2015   right kidney transplant   Stroke [facility])    Vascular dementia [facility])    Past Surgical History:  Past Surgical History:  Procedure Laterality Date   ABDOMINAL AORTOGRAM N/A 08/05/2020   Procedure: ABDOMINAL AORTOGRAM;  Surgeon: [name], MD;  Location: [place] CV LAB;  Service: Cardiovascular;  Laterality: N/A;   ABDOMINAL AORTOGRAM W/LOWER EXTREMITY N/A 09/09/2020   Procedure: ABDOMINAL AORTOGRAM W/LOWER EXTREMITY;  Surgeon: [name], MD;  Location: [place] CV LAB;  Service: Cardiovascular;  Laterality: N/A;   AMPUTATION Right 08/06/2020   Procedure: Right Fifth Toe Ray Amputation;  Surgeon: [name], MD;  Location: [place];  Service: Vascular;  Laterality: Right;   AMPUTATION Right 11/02/2020   Procedure: RIGHT AMPUTATION BELOW KNEE;  Surgeon: [name], MD;  Location: [place];  Service: Vascular;  Laterality: Right;   BACK SURGERY     Has had 2 back surgeries   BUBBLE STUDY  09/18/2019   Procedure: BUBBLE STUDY;  Surgeon: Buford Dresser, MD;  Location: [place];  Service: Cardiovascular;;   Depression     ENDARTERECTOMY FEMORAL Right 08/06/2020   Procedure: Right Ilio- Femoral Artery Endarterectomy;  Surgeon: [name], MD;  Location: [facility] OR;  Service: Vascular;  Laterality: Right;   HAND SURGERY Right    HAND SURGERY     HIP ARTHROPLASTY Left 06/04/2021   Procedure: ARTHROPLASTY BIPOLAR HIP (HEMIARTHROPLASTY);  Surgeon: Willaim Sheng, MD;  Location: [place];  Service: Orthopedics;  Laterality: Left;   IR DIALY SHUNT INTRO NEEDLE/INTRACATH INITIAL W/IMG RIGHT Right 06/07/2021   IR REMOVAL TUN CV CATH W/O FL  09/03/2019   KIDNEY TRANSPLANT     November 2015   LEG AMPUTATION BELOW KNEE Right    LOWER EXTREMITY ANGIOGRAPHY Bilateral 08/05/2020   Procedure: LOWER EXTREMITY ANGIOGRAPHY;  Surgeon: [name], MD;  Location: [place] CV LAB;  Service: Cardiovascular;  Laterality: Bilateral;   Memory loss     NEPHRECTOMY TRANSPLANTED ORGAN     PATCH ANGIOPLASTY Right 08/06/2020   Procedure: PATCH ANGIOPLASTY;  Surgeon: [name], MD;  Location: [facility] OR;  Service: Vascular;  Laterality: Right;   PERIPHERAL VASCULAR BALLOON ANGIOPLASTY Right 09/09/2020   Procedure: PERIPHERAL VASCULAR BALLOON ANGIOPLASTY;  Surgeon: [name], MD;  Location: [place] CV LAB;  Service: Cardiovascular;  Laterality: Right;  popliteal   PERIPHERAL VASCULAR INTERVENTION Right 08/05/2020   Procedure: PERIPHERAL VASCULAR INTERVENTION;  Surgeon: [name], MD;  Location: [place] CV LAB;  Service: Cardiovascular;  Laterality: Right;  SFA   PERIPHERAL VASCULAR INTERVENTION Right 09/09/2020   Procedure: PERIPHERAL VASCULAR INTERVENTION;  Surgeon: [name], MD;  Location: [place] CV LAB;  Service: Cardiovascular;  Laterality: Right;  external iliac   TEE WITHOUT CARDIOVERSION N/A 09/18/2019   Procedure: TRANSESOPHAGEAL ECHOCARDIOGRAM (TEE);  Surgeon: Buford Dresser, MD;  Location: [place];  Service:  Cardiovascular;  Laterality: N/A;   WOUND DEBRIDEMENT Right 10/08/2020   Procedure: DEBRIDEMENT WOUND RIGHT FOOT;  Surgeon: [name], MD;  Location: [place];  Service: Vascular;  Laterality: Right;   Social History:  reports that he has quit smoking. He has never used smokeless tobacco. He reports that he does not drink alcohol and does not use drugs.  Family / Support Systems Marital Status: Married Patient Roles: Spouse, Parent Spouse/Significant Other: [name] Children: [name] Other Supports: daughter Anticipated Caregiver: Jonathan [name] Ability/Limitations of Caregiver: Wife works nights and sleeps some during the day-pt uses transport for HD Caregiver Availability: Other (Comment) (need to come up with a plan) Family Dynamics: Close with family but thier children do work and have families. Limited supports according to wife  Social History Preferred language: English Religion: None Cultural Background: No issues Education: [place] - How often do you need to have someone help you when you read instructions, pamphlets, or other written material from your doctor or pharmacy?: Patient unable to respond Writes: No Employment Status: Retired Public relations account executive Issues: NO issues Guardian/Conservator: Pt's wife is his guardian pt is not capable of making decisions due to his dementia   Abuse/Neglect Abuse/Neglect Assessment Can Be Completed: Unable to assess, patient is non-responsive or altered mental status  Patient response to: Social Isolation - How often do you feel lonely or isolated from those around you?: Patient unable to respond  Emotional Status Pt's affect, behavior and adjustment status: Pt is confused and only oriented to self, recived information from wife. She reports pt was doing well and [facility]. He could get on the [facility] for HD and could manage while she slept after work. Wife has  concerns now due to how disoriented he is and issues he is presenting with after fall. Recent Psychosocial Issues: other health issues-ESRD, HX-CVA and BKA. Jonathan [name] is on his left leg Psychiatric History: no history Substance Abuse History: None  Patient / Family Perceptions, Expectations & Goals Pt/Family understanding of illness & functional limitations: Pt is aware he fractured his hip, wife has spoken with the MD and feel has an understanding of his treatment paln going forward and is hopeful he will do well here on rehab Premorbid pt/family roles/activities: husband, father, retiree, dialysis pt, neighbor, etc Anticipated changes in roles/activities/participation: resume Pt/family expectations/goals: Pt states: " I want to eat I'm hungry.'  Wife states: " I hope he does well he needs to be at least wheelchair level at discharge."  [employer]: Other (Comment) ([place] M,W, F HD  uses [facility] for transport) Premorbid Home Care/DME Agencies: Other (Comment) (has rw, rollator, 3 in 1) Transportation available at discharge: Wife and [facility] Is the patient able to respond to transportation needs?: Yes In the past 12 months, has lack of transportation kept you from medical appointments or from getting medications?: No In the past 12 months, has lack of transportation kept you from meetings, work, or from getting things needed for daily living?: No  Discharge Planning Living Arrangements: Spouse/significant other, Children Support Systems: Spouse/significant other, Children, Other relatives, Friends/neighbors Type of Residence: Private  residence Administrator, sports: Multimedia programmer (specify) Psychologist, prison and probation services) Museum/gallery curator Resources: Fish farm manager, Family Support Financial Screen Referred: No Living Expenses: Own Money Management: Spouse Does the patient have any problems obtaining your medications?: No Home Management: Wife Patient/Family Preliminary Plans: Return home  with wife and she will need to come up with a plan since this worker feels team will recommend 24/7 care. It seems his cogntive issues are worse being out of his familar environment and having had surgery and now pain meds. Care Coordinator Barriers to Discharge: Insurance for SNF coverage, Other (comments) Care Coordinator Anticipated Follow Up Needs: HH/OP  Clinical Impression Pt is quite cranky due to HD and had not eaten before he left. Staff getting lunch tray for him. Awaiting wife to call worker back to confirm information.  Jonathan [name] 06/11/2021, 1:23 PM

## 2021-06-11 NOTE — Progress Notes (Signed)
Pt slept through the night.  Report given to HD nurse this morning.Pt will go to dialysis after breakfast.

## 2021-06-11 NOTE — Progress Notes (Signed)
PROGRESS NOTE   Subjective/Complaints: Pt back after morning HD. No complaints. Denies any pain or problems  ROS: Limited due to cognitive/behavioral    Objective:   No results found. Recent Labs    06/09/21 0217 06/11/21 0634  WBC 7.2 8.6  HGB 7.2* 9.8*  HCT 22.2* 30.4*  PLT 177 248   Recent Labs    06/09/21 0217 06/11/21 0634  NA 135 137  K 5.1 5.2*  CL 96* 97*  CO2 29 29  GLUCOSE 180* 67*  BUN 77* 76*  CREATININE 8.02* 7.58*  CALCIUM 8.5* 9.0    Intake/Output Summary (Last 24 hours) at 06/11/2021 1135 Last data filed at 06/11/2021 1018 Gross per 24 hour  Intake 290 ml  Output 2000 ml  Net -1710 ml        Physical Exam: Vital Signs Blood pressure (!) 158/62, pulse 84, temperature 97.7 F (36.5 C), temperature source Oral, resp. rate (!) 22, height 5\' 9"  (1.753 m), weight 61.5 kg, SpO2 100 %.  General: Alert and oriented to person and place. No apparent distress HEENT: Head is normocephalic, atraumatic, PERRLA, EOMI, sclera anicteric, oral mucosa pink and moist, dentition intact, ext ear canals clear,  Neck: Supple without JVD or lymphadenopathy Heart: Reg rate and rhythm. No murmurs rubs or gallops Chest: CTA bilaterally without wheezes, rales, or rhonchi; no distress Abdomen: Soft, non-tender, non-distended, bowel sounds positive. Extremities: No clubbing, cyanosis, thrill RUE graft.  Pulses are 2+ Psych: Pt's affect is appropriate. Pt is cooperative Skin:right bka well healed. Left hip wound cdi with immediate post-op dressing in place. Redness buttocks/perineal areas Neuro:  slow to process. Fair insight and awareness. Told me his wife built his prosthesis Musculoskeletal: right BKA well shaped.     Assessment/Plan: 1. Functional deficits which require 3+ hours per day of interdisciplinary therapy in a comprehensive inpatient rehab setting. Physiatrist is providing close team supervision and  24 hour management of active medical problems listed below. Physiatrist and rehab team continue to assess barriers to discharge/monitor patient progress toward functional and medical goals  Care Tool:  Bathing              Bathing assist       Upper Body Dressing/Undressing Upper body dressing        Upper body assist      Lower Body Dressing/Undressing Lower body dressing            Lower body assist       Toileting Toileting    Toileting assist       Transfers Chair/bed transfer  Transfers assist           Locomotion Ambulation   Ambulation assist              Walk 10 feet activity   Assist           Walk 50 feet activity   Assist           Walk 150 feet activity   Assist           Walk 10 feet on uneven surface  activity   Assist  Wheelchair     Assist               Wheelchair 50 feet with 2 turns activity    Assist            Wheelchair 150 feet activity     Assist          Blood pressure (!) 158/62, pulse 84, temperature 97.7 F (36.5 C), temperature source Oral, resp. rate (!) 22, height 5\' 9"  (1.753 m), weight 61.5 kg, SpO2 100 %.  Medical Problem List and Plan: 1.  L hemiarthroplasty of L hip  secondary to L hip fracture, hx of right BKA             -patient may  shower               -ELOS/Goals: mod to min A 18-22 days  Patient is beginning CIR therapies today including PT and OT  2.  Antithrombotics: -DVT/anticoagulation:  Pharmaceutical: Other (comment)--Eliquis bid             -antiplatelet therapy: Resume ASA.  3. Pain Management:  Hydrocodone prn but not able to express needs/wants             --scheduled a dose of hydrocodone every morning and monitor for tolerance.  4. Mood: LCSW to follow for evaluation and support.              -antipsychotic agents: N/a 5. Neuropsych: This patient is not capable of making decisions on his own behalf.  -monitor  cognition/affect. ?baseline 6. Skin/Wound Care:   Cefadroxil 500 mg/every 36 hour for wound prophylaxis- completed.               --Nystatin cream and powder to perineum and penile meatus.              --Gerhardt's butt cream to sacrum  7. Fluids/Electrolytes/Nutrition: strict I/O. Add 1200 cc FR.  --Discontinue ensure as nephro also on board.  8. T2DM: Monitor BS ac/hs and use SSI for elevated BS. 9. ESRD: Schedule HD at the end of the day MWF to  help with tolerance of therapy.  11/4 Unfortunately on HD this morning d/t scheduling issues             --Strict I/O. Daily wts. Renvela for metabolic bone disease 10. CAD/ICM: Monitor for symptoms with increase in activity. No meds to avoid hypotension w/HD.              --On  Lipitor.  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  12. Acute on chronic anemia: Monitor H/H with HD labs. Now on Aranesp weekly  13. Diarrhea: Felt to be due to antibiotics.  --added fibercon for bulking.  14. Acute on chronic insomnia: scheduled Trazodone 25 mg/hs             --sleep chart. 15. Food pocketing?- needs D2 diet per wife.     LOS: 1 days A FACE TO FACE EVALUATION WAS PERFORMED  Meredith Staggers 06/11/2021, 11:35 AM

## 2021-06-11 NOTE — Progress Notes (Signed)
[facility] Individual Statement of Services  Patient Name:  Jonathan CYPERT Sr.  Date:  06/11/2021  Welcome to the [facility].  Our goal is to provide you with an individualized program based on your diagnosis and situation, designed to meet your specific needs.  With this comprehensive rehabilitation program, you will be expected to participate in at least 3 hours of rehabilitation therapies Monday-Friday, with modified therapy programming on the weekends.  Your rehabilitation program will include the following services:  Physical Therapy (PT), Occupational Therapy (OT), 24 hour per day rehabilitation nursing, Care Coordinator, Rehabilitation Medicine, Nutrition Services, and Pharmacy Services  Weekly team conferences will be held on Tuesday to discuss your progress.  Your Inpatient Rehabilitation Care Coordinator will talk with you frequently to get your input and to update you on team discussions.  Team conferences with you and your family in attendance may also be held.  Expected length of stay: 7-10 days  Overall anticipated outcome: min-mod assist-required assist prior to admission due to dementia  Depending on your progress and recovery, your program may change. Your Inpatient Rehabilitation Care Coordinator will coordinate services and will keep you informed of any changes. Your Inpatient Rehabilitation Care Coordinator's name and contact numbers are listed  below.  The following services may also be recommended but are not provided by the [facility]:   [place] will be made to provide these services after discharge if needed.  Arrangements include referral to agencies that provide these services.  Your insurance has been verified to be:  [place] primary doctor is:  Marton Redwood  Pertinent information will be shared with your doctor  and your insurance company.  Inpatient Rehabilitation Care Coordinator:  Ovidio Kin, [place] or Emilia Beck  Information discussed with and copy given to patient by: Elease Hashimoto, 06/11/2021, 2:16 PM

## 2021-06-11 NOTE — Progress Notes (Signed)
[facility] Progress Note   Subjective: Seen in HD unit. UF goal 2L -tolerating so far, but have some hip pain.   Objective Vitals:   06/11/21 0930 06/11/21 1000 06/11/21 1014 06/11/21 1018  BP: 127/61 (!) 112/58 (!) 140/59 (!) 158/62  Pulse: 82 83 82 84  Resp: 15 10  (!) 22  Temp:    97.7 F (36.5 C)  TempSrc:    Oral  SpO2:    100%  Weight:    61.5 kg  Height:       Physical Exam General: Older male with flat affect, NAD Heart:S1,S2 no M/R/G Lungs: CTAB A/P  No WOB Abdomen: NABS, NT Extremities: R BKA no stump edema. LLE without edema.  Dialysis Access: R AVF +T/B mild edema.    Additional Objective Labs: Basic Metabolic Panel: Recent Labs  Lab 06/08/21 0219 06/09/21 0217 06/11/21 0634  NA 133* 135 137  K 4.5 5.1 5.2*  CL 97* 96* 97*  CO2 25 29 29   GLUCOSE 286* 180* 67*  BUN 57* 77* 76*  CREATININE 6.62* 8.02* 7.58*  CALCIUM 8.0* 8.5* 9.0  PHOS 4.2 3.8 4.5    Liver Function Tests: Recent Labs  Lab 06/05/21 0320 06/06/21 0259 06/08/21 0219 06/09/21 0217 06/11/21 0634  AST 18  --   --   --   --   ALT 12  --   --   --   --   ALKPHOS 50  --   --   --   --   BILITOT 0.6  --   --   --   --   PROT 5.5*  --   --   --   --   ALBUMIN 2.8*   < > 2.5* 2.4* 2.4*   < > = values in this interval not displayed.    No results for input(s): LIPASE, AMYLASE in the last 168 hours. CBC: Recent Labs  Lab 06/06/21 0259 06/06/21 1426 06/07/21 0216 06/07/21 0748 06/08/21 0219 06/09/21 0217 06/11/21 0634  WBC 7.9  --  8.1 7.7 6.3 7.2 8.6  NEUTROABS 6.0  --  6.3  --  4.8  --   --   HGB 6.4*   < > 7.2* 7.1* 8.3* 7.2* 9.8*  HCT 18.9*   < > 21.9* 21.5* 24.6* 22.2* 30.4*  MCV 95.9  --  94.0 94.3 89.1 90.2 93.0  PLT 144*  --  142* 154 170 177 248   < > = values in this interval not displayed.    Blood Culture    Component Value Date/Time   SDES BLOOD SITE NOT SPECIFIED 10/30/2020 1307   SPECREQUEST  10/30/2020 1307    BOTTLES DRAWN AEROBIC  ONLY Blood Culture results may not be optimal due to an inadequate volume of blood received in culture bottles   CULT  10/30/2020 1307    NO GROWTH 5 DAYS Performed at [facility], [place] [address] [place], [place] [address]    REPTSTATUS 11/04/2020 FINAL 10/30/2020 1307    Cardiac Enzymes: No results for input(s): CKTOTAL, CKMB, CKMBINDEX, TROPONINI in the last 168 hours. CBG: Recent Labs  Lab 06/10/21 0807 06/10/21 1154 06/10/21 1555 06/10/21 1709 06/11/21 0614  GLUCAP 80 136* 240* 255* 75    Iron Studies: No results for input(s): IRON, TIBC, TRANSFERRIN, FERRITIN in the last 72 hours. @lablastinr3 @ Studies/Results: No results found. Medications:    allopurinol  100 mg Oral QHS   apixaban  5 mg Oral BID  aspirin  81 mg Oral Daily   atorvastatin  40 mg Oral QHS   Chlorhexidine Gluconate Cloth  6 each Topical Q0600   [START ON 06/16/2021] darbepoetin (ARANESP) injection - DIALYSIS  100 mcg Intravenous Q Wed-HD   feeding supplement (NEPRO CARB STEADY)  237 mL Oral QHS   folic acid  1 mg Oral Daily   Gerhardt's butt cream   Topical QID   heparin sodium (porcine)  3,000 Units Intravenous Once   HYDROcodone-acetaminophen  1 tablet Oral Q breakfast   insulin aspart  0-9 Units Subcutaneous TID AC & HS   multivitamin  1 tablet Oral QHS   nystatin   Topical TID   nystatin cream   Topical BID   polycarbophil  1,250 mg Oral Daily   predniSONE  5 mg Oral Q breakfast   sevelamer carbonate  800 mg Oral TID WC     OP HD: G-O MWF  4h 77min  400/1.5  61.5kg   2/2 bath  AVF 15g  Heparin 3000 Calcitriol 1.12mcg PO q HD   Assessment/Plan Left femoral neck fracture: s/p fixation per orthopedic surgery 10/28. Now in CIR.  ESRD:  HD MWF. HD today on schedule  Right upper arm/AV fistula swelling: IR consulted to evaluate.  Went for F'gram 11/1. No intervention.  Hypertension/volume: No evidence of volume excess by exam. BP controlled. UF as tolerated.  Anemia: S/P 2 unit of  PRBCs 06/07/2021.  1 unit on 11/2.  Give Aranesp with HD 11/7. Hgb 9.8  Metabolic bone disease: Ca+/Po4 at goal. continue calcitriol and binders.   Nutrition: ALB 2.4 protein supplement to diet, renal/carb modified CAD/a.fib/hx of LV thrombus: cardiology consulted  cleared for hip surgery . no chest pain or dyspnea, back on apixaban, he is stable /cardiology signed off 10/29  Failed KT on prednisone Multi infarct Dementia- per primary  Lynnda Child PA-C [facility] 06/11/2021,11:03 AM

## 2021-06-11 NOTE — Plan of Care (Signed)
  Problem: RH Bed Mobility Goal: LTG Patient will perform bed mobility with assist (PT) Description: LTG: Patient will perform bed mobility with assistance, with/without cues (PT). Flowsheets (Taken 06/11/2021 1612) LTG: Pt will perform bed mobility with assistance level of: Moderate Assistance - Patient 50 - 74%   Problem: RH Bed to Chair Transfers Goal: LTG Patient will perform bed/chair transfers w/assist (PT) Description: LTG: Patient will perform bed to chair transfers with assistance (PT). Flowsheets (Taken 06/11/2021 1612) LTG: Pt will perform Bed to Chair Transfers with assistance level: (LRAD) Moderate Assistance - Patient 50 - 74%   Problem: RH Car Transfers Goal: LTG Patient will perform car transfers with assist (PT) Description: LTG: Patient will perform car transfers with assistance (PT). Flowsheets (Taken 06/11/2021 1612) LTG: Pt will perform car transfers with assist:: (LRAD) Moderate Assistance - Patient 50 - 74%   Problem: RH Wheelchair Mobility Goal: LTG Patient will propel w/c in home environment (PT) Description: LTG: Patient will propel wheelchair in home environment, # of feet with assistance (PT). Flowsheets (Taken 06/11/2021 1612) LTG: Pt will propel w/c in home environ  assist needed:: Moderate Assistance - Patient 50 - 74% Distance: wheelchair distance in controlled environment: 75 LTG: Propel w/c distance in home environment: 29'

## 2021-06-11 NOTE — IPOC Note (Signed)
Overall Plan of Care [facility]) Patient Details Name: Jonathan [name]. MRN: [id number] DOB: [birth year]  Admitting Diagnosis: Closed left subtrochanteric femur fracture, sequela  Hospital Problems: Principal Problem:   Closed left subtrochanteric femur fracture, sequela Active Problems:   ESRD (end stage renal disease) on dialysis [facility])   Vascular dementia without behavioral disturbance ([name])   Hx of right BKA ([place])     Functional Problem List: Nursing Bowel, Endurance, Pain, Medication Management, Safety  PT Behavior, Endurance, Motor, Perception, Safety  OT Balance, Cognition, Endurance, Motor, Safety, Sensory (based on acute care notes)  SLP    TR         Basic ADL's: OT Eating, Grooming, Bathing, Dressing, Toileting     Advanced  ADL's: OT       Transfers: PT Bed Mobility, Bed to Chair, Car, Chief Operating Officer: PT Ambulation, Emergency planning/management officer, Stairs     Additional Impairments: OT None  SLP        TR      Anticipated Outcomes Item Anticipated Outcome  Self Feeding min A  Swallowing      Basic self-care  max A  Toileting  total A (wife will be trained on how to A)   Bathroom Transfers BSC transfers only - mod A  Bowel/Bladder  manage bowel w mod I assist  Transfers  Mod A  Locomotion  Mod A  Communication     Cognition     Pain  at or below level 4 with prn meds  Safety/Judgment  maintain safety w cues/reminders   Therapy Plan: PT Intensity: Minimum of 1-2 x/day ,45 to 90 minutes PT Frequency: 5 out of 7 days PT Duration Estimated Length of Stay: 7-10 days OT Intensity: Minimum of 1-2 x/day, 45 to 90 minutes OT Frequency: 5 out of 7 days OT Duration/Estimated Length of Stay: 7 days for family education ( will extend if pt starts to participate)     Due to the current state of emergency, patients may not be receiving their 3-hours of Medicare-mandated therapy.   Team Interventions: Nursing Interventions  Patient/Family Education, Bowel Management, Pain Management, Skin Care/Wound Management, Disease Management/Prevention, Medication Management, Discharge Planning  PT interventions Ambulation/gait training, DME/adaptive equipment instruction, Psychosocial support, UE/LE Strength taining/ROM, Balance/vestibular training, Functional electrical stimulation, Skin care/wound management, UE/LE Coordination activities, Cognitive remediation/compensation, Functional mobility training, Splinting/orthotics, Visual/perceptual remediation/compensation, Community reintegration, Neuromuscular re-education, IT trainer, Wheelchair propulsion/positioning, Discharge planning, Pain management, Therapeutic Activities, Disease management/prevention, Patient/family education, Therapeutic Exercise  OT Interventions Training and development officer, Therapeutic Activities, Self Care/advanced ADL retraining, Functional mobility training, Patient/family education, Therapeutic Exercise, Psychosocial support, DME/adaptive equipment instruction, Discharge planning, Pain management  SLP Interventions    TR Interventions    SW/CM Interventions Discharge Planning, Psychosocial Support, Patient/Family Education   Barriers to Discharge MD  Medical stability  Nursing Decreased caregiver support, Home environment access/layout, Hemodialysis 2 level 1 ste main B+B w spouse, SCAT M-W-F for HD  PT Inaccessible home environment, Decreased caregiver support, Home environment access/layout, Wound Care, Hemodialysis, Weight bearing restrictions, Behavior Pt refused all PT intervention and became agitated throughout session. Pt has cognitive impairment at baseline  OT Behavior, Other (comments) pt is not oriented, unaware he had hip surgery (did know he fell), unwilling to participate; baseline history of dementia  SLP      SW Insurance for SNF coverage, Other (comments)     Team Discharge Planning: Destination: PT-Home ,OT- Home , SLP-   Projected Follow-up: PT-Home health PT,  Skilled nursing facility, OT-  Home health OT, SLP-  Projected Equipment Needs: PT-To be determined, OT- To be determined, SLP-  Equipment Details: PT- , OT-  Patient/family involved in discharge planning: PT- Patient unable/family or caregiver not available (Pt unresponsive to questions and became agitated, telling therapist to leave),  OT-Patient unable/family or caregiver not available, SLP-   MD ELOS: ?7 days Medical Rehab Prognosis:  Good Assessment: The patient has been admitted for CIR therapies with the diagnosis of left hip fx, hx of right BKA. The team will be addressing functional mobility, strength, stamina, balance, safety, adaptive techniques and equipment, self-care, bowel and bladder mgt, patient and caregiver education, pain mgt, ortho precautions, community reentry. Goals have been set at mod to max assist with mobility and self-care. Pt not participating much with therapy on day 1. Focus may just be on family ed.   Due to the current state of emergency, patients may not be receiving their 3 hours per day of Medicare-mandated therapy.    Meredith Staggers, MD, FAAPMR     See Team Conference Notes for weekly updates to the plan of care

## 2021-06-12 LAB — GLUCOSE, CAPILLARY
Glucose-Capillary: 102 mg/dL — ABNORMAL HIGH (ref 70–99)
Glucose-Capillary: 166 mg/dL — ABNORMAL HIGH (ref 70–99)
Glucose-Capillary: 171 mg/dL — ABNORMAL HIGH (ref 70–99)
Glucose-Capillary: 138 mg/dL — ABNORMAL HIGH (ref 70–99)

## 2021-06-12 NOTE — Progress Notes (Signed)
[facility] Progress Note   Subjective:  Completed dialysis yesterday. No issues Seen in room. No new complaints. Did "fine" with therapy this am.   Objective Vitals:   06/11/21 1407 06/11/21 2002 06/12/21 0350 06/12/21 0400  BP: 124/61 131/69 (!) 141/64   Pulse: 84 86 91   Resp: 15 18 18    Temp: 97.8 F (36.6 C) 98.1 F (36.7 C) 98.3 F (36.8 C)   TempSrc: Oral     SpO2: 100% 100% 100%   Weight:    62.9 kg  Height:       Physical Exam General: Older male with flat affect, NAD Heart:S1,S2 no M/R/G Lungs: CTAB A/P  No WOB Abdomen: NABS, NT Extremities: R BKA no stump edema. LLE without edema.  Dialysis Access: R AVF +T/B mild edema.    Additional Objective Labs: Basic Metabolic Panel: Recent Labs  Lab 06/08/21 0219 06/09/21 0217 06/11/21 0634  NA 133* 135 137  K 4.5 5.1 5.2*  CL 97* 96* 97*  CO2 25 29 29   GLUCOSE 286* 180* 67*  BUN 57* 77* 76*  CREATININE 6.62* 8.02* 7.58*  CALCIUM 8.0* 8.5* 9.0  PHOS 4.2 3.8 4.5    Liver Function Tests: Recent Labs  Lab 06/08/21 0219 06/09/21 0217 06/11/21 0634  ALBUMIN 2.5* 2.4* 2.4*    No results for input(s): LIPASE, AMYLASE in the last 168 hours. CBC: Recent Labs  Lab 06/06/21 0259 06/06/21 1426 06/07/21 0216 06/07/21 0748 06/08/21 0219 06/09/21 0217 06/11/21 0634  WBC 7.9  --  8.1 7.7 6.3 7.2 8.6  NEUTROABS 6.0  --  6.3  --  4.8  --   --   HGB 6.4*   < > 7.2* 7.1* 8.3* 7.2* 9.8*  HCT 18.9*   < > 21.9* 21.5* 24.6* 22.2* 30.4*  MCV 95.9  --  94.0 94.3 89.1 90.2 93.0  PLT 144*  --  142* 154 170 177 248   < > = values in this interval not displayed.    Blood Culture    Component Value Date/Time   SDES BLOOD SITE NOT SPECIFIED 10/30/2020 1307   SPECREQUEST  10/30/2020 1307    BOTTLES DRAWN AEROBIC ONLY Blood Culture results may not be optimal due to an inadequate volume of blood received in culture bottles   CULT  10/30/2020 1307    NO GROWTH 5 DAYS Performed at [facility], [place] [address], [place], [place] [address]    REPTSTATUS 11/04/2020 FINAL 10/30/2020 1307    Cardiac Enzymes: No results for input(s): CKTOTAL, CKMB, CKMBINDEX, TROPONINI in the last 168 hours. CBG: Recent Labs  Lab 06/11/21 0614 06/11/21 1151 06/11/21 1619 06/11/21 2056 06/12/21 0615  GLUCAP 75 160* 198* 264* 102*    Iron Studies: No results for input(s): IRON, TIBC, TRANSFERRIN, FERRITIN in the last 72 hours. @lablastinr3 @ Studies/Results: No results found. Medications:    allopurinol  100 mg Oral QHS   apixaban  5 mg Oral BID   aspirin  81 mg Oral Daily   atorvastatin  40 mg Oral QHS   Chlorhexidine Gluconate Cloth  6 each Topical Q0600   [START ON 06/14/2021] darbepoetin (ARANESP) injection - DIALYSIS  100 mcg Intravenous Q Mon-HD   feeding supplement (NEPRO CARB STEADY)  237 mL Oral QHS   folic acid  1 mg Oral Daily   Gerhardt's butt cream   Topical QID   heparin sodium (porcine)  3,000 Units Intravenous Once   HYDROcodone-acetaminophen  1 tablet Oral Q breakfast  insulin aspart  0-9 Units Subcutaneous TID AC & HS   multivitamin  1 tablet Oral QHS   nystatin   Topical TID   nystatin cream   Topical BID   polycarbophil  1,250 mg Oral Daily   predniSONE  5 mg Oral Q breakfast   sevelamer carbonate  800 mg Oral TID WC     OP HD: G-O MWF  4h 59min  400/1.5  61.5kg   2/2 bath  AVF 15g  Heparin 3000 Calcitriol 1.12mcg PO q HD   Assessment/Plan Left femoral neck fracture: s/p fixation per orthopedic surgery 10/28. Now in CIR.  ESRD:  HD MWF. Next HD 11/7  Right upper arm/AV fistula swelling: IR consulted to evaluate.  Went for F'gram 11/1. No intervention.  Hypertension/volume: No evidence of volume excess by exam. BP controlled. UF as tolerated.  Anemia: S/P 2 unit of PRBCs 06/07/2021.  1 unit on 11/2.  Give Aranesp with HD 11/7. Hgb 9.8  Metabolic bone disease: Ca+/Po4 at goal. continue calcitriol and binders.   Nutrition: ALB 2.4 protein supplement to  diet, renal/carb modified CAD/a.fib/hx of LV thrombus: cardiology consulted  cleared for hip surgery . no chest pain or dyspnea, back on apixaban, he is stable /cardiology signed off 10/29  Failed KT on prednisone Multi infarct Dementia- per primary  Lynnda Child PA-C [facility] 06/12/2021,10:29 AM

## 2021-06-12 NOTE — Progress Notes (Signed)
Occupational Therapy Note  Patient Details  Name: Jonathan MAU Sr. MRN: [id number] Date of Birth: [birth year]  Today's Date: 06/12/2021 OT Missed Time: 57 Minutes Missed Time Reason: Patient unwilling/refused to participate without medical reason  Pt refusing OT this AM stating he already had therapy today. OT provided encouragement and offered to remain in room, however pt continue to refuse.    Jonathan [name] 06/12/2021, 11:40 AM

## 2021-06-12 NOTE — Progress Notes (Signed)
Physical Therapy Session Note  Patient Details  Name: Jonathan KARWOWSKI Sr. MRN: [id number] Date of Birth: [birth year]  Today's Date: 06/12/2021 PT Individual Time: 0805-0914 PT Individual Time Calculation (min): 69 min   Short Term Goals: Week 1:  PT Short Term Goal 1 (Week 1): STG = LTG due to ELOS  Skilled Therapeutic Interventions/Progress Updates:     Pt received supine in bed and agrees to therapy. Initially no reports of pain but as soon as pt begins moving he grimaces in pain and complains of pain in L hip. PT provides education, repositioning, rest breaks, and mobility to manage pain. Supine to sit with maxA and cues for sequencing. Pt sits at EOB for several minutes, initially reporting dizziness but then only reporting pain in hip. Pt performs lateral scoot transfer from elevated bed to [facility] with maxA and cues for body mechanics, initiation, sequencing, and positioning. WC transport to gym for time management. Sliding board transfer to mat with maxA and poor sequencing from pt, leaning back too far and essentially rolling onto mat rather than scooting in typical manner. Pt reporting increased pain in L hip so extended rest break taken in supine with L leg propped on cushion for relief. Pt then performs L lower extremity therex for strengthening, ROM, and pain management. Pt completes 1x10 SAQs, SLRs, hip ab/adduction, and heel slides. Supine to sit with maxA and cues for sequencing and positioning. Pt requires dependent +2 assist to stand from mat to [place], with cues for initiation, body mechanics, and sequencing. TotalA+1 to stand from elevated seat of [place] to prepare to sit back down to WC. Sliding board transfer back to bed with maxA. Left supine with alarm intact and all needs within reach.  Therapy Documentation Precautions:  Precautions Precautions: Posterior Hip, Fall Precaution Comments: provided precaution handouts Required Braces or Orthoses: Other Brace Other Brace: right  prosthesis Restrictions Weight Bearing Restrictions: Yes RLE Weight Bearing: Weight bearing as tolerated LLE Weight Bearing: Weight bearing as tolerated Other Position/Activity Restrictions: Posterior hip precautions   Therapy/Group: Individual Therapy  Breck Coons, PT, DPT 06/12/2021, 4:16 PM

## 2021-06-12 NOTE — Progress Notes (Signed)
PROGRESS NOTE   Subjective/Complaints:  Reviewed labs from HD yesterday   ROS: Limited due to cognitive/behavioral    Objective:   No results found. Recent Labs    06/11/21 0634  WBC 8.6  HGB 9.8*  HCT 30.4*  PLT 248    Recent Labs    06/11/21 0634  NA 137  K 5.2*  CL 97*  CO2 29  GLUCOSE 67*  BUN 76*  CREATININE 7.58*  CALCIUM 9.0     Intake/Output Summary (Last 24 hours) at 06/12/2021 0729 Last data filed at 06/12/2021 0720 Gross per 24 hour  Intake 958 ml  Output 2000 ml  Net -1042 ml         Physical Exam: Vital Signs Blood pressure (!) 141/64, pulse 91, temperature 98.3 F (36.8 C), resp. rate 18, height 5\' 9"  (1.753 m), weight 62.9 kg, SpO2 100 %.   General: No acute distress Mood and affect are appropriate Heart: Regular rate and rhythm no rubs murmurs or extra sounds Lungs: Clear to auscultation, breathing unlabored, no rales or wheezes Abdomen: Positive bowel sounds, soft nontender to palpation, nondistended Extremities: No clubbing, cyanosis, or edema Skin: No evidence of breakdown, no evidence of rash   Skin:right bka well healed. Left hip wound cdi with immediate post-op dressing in place. Redness buttocks/perineal areas Neuro:  slow to process. Fair insight and awareness. Told me his wife built his prosthesis Musculoskeletal: right BKA well shaped.     Assessment/Plan: 1. Functional deficits which require 3+ hours per day of interdisciplinary therapy in a comprehensive inpatient rehab setting. Physiatrist is providing close team supervision and 24 hour management of active medical problems listed below. Physiatrist and rehab team continue to assess barriers to discharge/monitor patient progress toward functional and medical goals  Care Tool:  Bathing  Bathing activity did not occur: Refused           Bathing assist       Upper Body Dressing/Undressing Upper body  dressing Upper body dressing/undressing activity did not occur (including orthotics): Refused      Upper body assist      Lower Body Dressing/Undressing Lower body dressing    Lower body dressing activity did not occur: Refused       Lower body assist       Toileting Toileting Toileting Activity did not occur (Clothing management and hygiene only): N/A (no void or bm)  Toileting assist       Transfers Chair/bed transfer  Transfers assist  Chair/bed transfer activity did not occur: Refused        Locomotion Ambulation   Ambulation assist   Ambulation activity did not occur: Refused          Walk 10 feet activity   Assist  Walk 10 feet activity did not occur: Refused        Walk 50 feet activity   Assist Walk 50 feet with 2 turns activity did not occur: Refused         Walk 150 feet activity   Assist Walk 150 feet activity did not occur: Refused         Walk 10 feet on uneven surface  activity  Assist Walk 10 feet on uneven surfaces activity did not occur: Refused         Wheelchair     Assist Is the patient using a wheelchair?: Yes Type of Wheelchair: Manual Wheelchair activity did not occur: Refused         Wheelchair 50 feet with 2 turns activity    Assist    Wheelchair 50 feet with 2 turns activity did not occur: Refused       Wheelchair 150 feet activity     Assist  Wheelchair 150 feet activity did not occur: Refused       Blood pressure (!) 141/64, pulse 91, temperature 98.3 F (36.8 C), resp. rate 18, height 5\' 9"  (1.753 m), weight 62.9 kg, SpO2 100 %.  Medical Problem List and Plan: 1.  L hemiarthroplasty of L hip  secondary to L hip fracture, hx of right BKA- pt states he does not use prosthesis             -patient may  shower               -ELOS/Goals: mod to min A 18-22 days  Patient is beginning CIR therapies today including PT and OT  2.  Antithrombotics: -DVT/anticoagulation:   Pharmaceutical: Other (comment)--Eliquis bid             -antiplatelet therapy: Resume ASA.  3. Pain Management:  Hydrocodone prn but not able to express needs/wants             --scheduled a dose of hydrocodone every morning and monitor for tolerance.  4. Mood: LCSW to follow for evaluation and support.              -antipsychotic agents: N/a 5. Neuropsych: This patient is not capable of making decisions on his own behalf.  -monitor cognition/affect. ?baseline 6. Skin/Wound Care:   Cefadroxil 500 mg/every 36 hour for wound prophylaxis- completed.               --Nystatin cream and powder to perineum and penile meatus.              --Gerhardt's butt cream to sacrum  7. Fluids/Electrolytes/Nutrition: strict I/O. Add 1200 cc FR.  --Discontinue ensure as nephro also on board.  8. T2DM: Monitor BS ac/hs and use SSI for elevated BS. 9. ESRD: Schedule HD at the end of the day MWF to  help with tolerance of therapy.  11/4 Unfortunately on HD this morning d/t scheduling issues             --Strict I/O. Daily wts. Renvela for metabolic bone disease 10. CAD/ICM: Monitor for symptoms with increase in activity. No meds to avoid hypotension w/HD.              --On  Lipitor.  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  12. Acute on chronic anemia: Monitor H/H with HD labs. Now on Aranesp weekly  13. Diarrhea: Felt to be due to antibiotics.  --added fibercon for bulking.  14. Acute on chronic insomnia: scheduled Trazodone 25 mg/hs             --sleep chart. 15. Food pocketing?- needs D2 diet per wife.   16.  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

## 2021-06-13 LAB — GLUCOSE, CAPILLARY
Glucose-Capillary: 91 mg/dL (ref 70–99)
Glucose-Capillary: 153 mg/dL — ABNORMAL HIGH (ref 70–99)
Glucose-Capillary: 144 mg/dL — ABNORMAL HIGH (ref 70–99)
Glucose-Capillary: 167 mg/dL — ABNORMAL HIGH (ref 70–99)

## 2021-06-13 NOTE — Progress Notes (Signed)
[facility] Progress Note   Subjective:  Seen in room. Family visiting. No new complaints this am.   Objective Vitals:   06/12/21 1335 06/12/21 1937 06/13/21 0355 06/13/21 0509  BP: 137/63 129/69 139/67   Pulse: 88 88 95   Resp: 17 16 18    Temp: 97.8 F (36.6 C) 98.5 F (36.9 C) 98.7 F (37.1 C)   TempSrc: Oral Oral Oral   SpO2: 99% 99% 98%   Weight:    63.9 kg  Height:       Physical Exam General: Older male with flat affect, NAD Heart:S1,S2 no M/R/G Lungs: CTAB A/P  No WOB Abdomen: NABS, NT Extremities: R BKA no stump edema. LLE without edema.  Dialysis Access: R AVF +T/B mild edema.    Additional Objective Labs: Basic Metabolic Panel: Recent Labs  Lab 06/08/21 0219 06/09/21 0217 06/11/21 0634  NA 133* 135 137  K 4.5 5.1 5.2*  CL 97* 96* 97*  CO2 25 29 29   GLUCOSE 286* 180* 67*  BUN 57* 77* 76*  CREATININE 6.62* 8.02* 7.58*  CALCIUM 8.0* 8.5* 9.0  PHOS 4.2 3.8 4.5    Liver Function Tests: Recent Labs  Lab 06/08/21 0219 06/09/21 0217 06/11/21 0634  ALBUMIN 2.5* 2.4* 2.4*    No results for input(s): LIPASE, AMYLASE in the last 168 hours. CBC: Recent Labs  Lab 06/07/21 0216 06/07/21 0748 06/08/21 0219 06/09/21 0217 06/11/21 0634  WBC 8.1 7.7 6.3 7.2 8.6  NEUTROABS 6.3  --  4.8  --   --   HGB 7.2* 7.1* 8.3* 7.2* 9.8*  HCT 21.9* 21.5* 24.6* 22.2* 30.4*  MCV 94.0 94.3 89.1 90.2 93.0  PLT 142* 154 170 177 248    Blood Culture    Component Value Date/Time   SDES BLOOD SITE NOT SPECIFIED 10/30/2020 1307   SPECREQUEST  10/30/2020 1307    BOTTLES DRAWN AEROBIC ONLY Blood Culture results may not be optimal due to an inadequate volume of blood received in culture bottles   CULT  10/30/2020 1307    NO GROWTH 5 DAYS Performed at [facility], [place] [address] [place], [place] [address]    REPTSTATUS 11/04/2020 FINAL 10/30/2020 1307    Cardiac Enzymes: No results for input(s): CKTOTAL, CKMB, CKMBINDEX, TROPONINI in the  last 168 hours. CBG: Recent Labs  Lab 06/12/21 0615 06/12/21 1140 06/12/21 1617 06/12/21 2113 06/13/21 0606  GLUCAP 102* 166* 171* 138* 91    Iron Studies: No results for input(s): IRON, TIBC, TRANSFERRIN, FERRITIN in the last 72 hours. @lablastinr3 @ Studies/Results: No results found. Medications:    allopurinol  100 mg Oral QHS   apixaban  5 mg Oral BID   aspirin  81 mg Oral Daily   atorvastatin  40 mg Oral QHS   Chlorhexidine Gluconate Cloth  6 each Topical Q0600   [START ON 06/14/2021] darbepoetin (ARANESP) injection - DIALYSIS  100 mcg Intravenous Q Mon-HD   feeding supplement (NEPRO CARB STEADY)  237 mL Oral QHS   folic acid  1 mg Oral Daily   Gerhardt's butt cream   Topical QID   heparin sodium (porcine)  3,000 Units Intravenous Once   HYDROcodone-acetaminophen  1 tablet Oral Q breakfast   insulin aspart  0-9 Units Subcutaneous TID AC & HS   multivitamin  1 tablet Oral QHS   nystatin   Topical TID   nystatin cream   Topical BID   polycarbophil  1,250 mg Oral Daily   predniSONE  5 mg  Oral Q breakfast   sevelamer carbonate  800 mg Oral TID WC     OP HD: G-O MWF  4h 57min  400/1.5  61.5kg   2/2 bath  AVF 15g  Heparin 3000 Calcitriol 1.6mcg PO q HD   Assessment/Plan Left femoral neck fracture: s/p fixation per orthopedic surgery 10/28. Now in CIR.  ESRD:  HD MWF. Next HD 11/7  Right upper arm/AV fistula swelling: IR consulted to evaluate.  Went for F'gram 11/1. No intervention.  Hypertension/volume: No evidence of volume excess by exam. BP controlled. UF as tolerated.  Anemia: S/P 2 unit of PRBCs 06/07/2021.  1 unit on 11/2.  Give Aranesp with HD 11/7. Hgb 9.8  Metabolic bone disease: Ca+/Po4 at goal. continue calcitriol and binders.  Nutrition: ALB 2.4 protein supplement to diet, renal/carb modified CAD/a.fib/hx of LV thrombus: cardiology consulted  cleared for hip surgery . 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

## 2021-06-14 LAB — RENAL FUNCTION PANEL
Albumin: 2.4 g/dL — ABNORMAL LOW (ref 3.5–5.0)
Anion gap: 12 (ref 5–15)
BUN: 98 mg/dL — ABNORMAL HIGH (ref 8–23)
CO2: 24 mmol/L (ref 22–32)
Calcium: 8.6 mg/dL — ABNORMAL LOW (ref 8.9–10.3)
Chloride: 94 mmol/L — ABNORMAL LOW (ref 98–111)
Creatinine, Ser: 8.98 mg/dL — ABNORMAL HIGH (ref 0.61–1.24)
GFR, Estimated: 6 mL/min — ABNORMAL LOW (ref >60)
Glucose, Bld: 95 mg/dL (ref 70–99)
Phosphorus: 4 mg/dL (ref 2.5–4.6)
Potassium: 5.8 mmol/L — ABNORMAL HIGH (ref 3.5–5.1)
Sodium: 130 mmol/L — ABNORMAL LOW (ref 135–145)

## 2021-06-14 LAB — CBC
HCT: 28.2 % — ABNORMAL LOW (ref 39.0–52.0)
Hemoglobin: 9.3 g/dL — ABNORMAL LOW (ref 13.0–17.0)
MCH: 30.3 pg (ref 26.0–34.0)
MCHC: 33 g/dL (ref 30.0–36.0)
MCV: 91.9 fL (ref 80.0–100.0)
Platelets: 304 10*3/uL (ref 150–400)
RBC: 3.07 MIL/uL — ABNORMAL LOW (ref 4.22–5.81)
RDW: 18.2 % — ABNORMAL HIGH (ref 11.5–15.5)
WBC: 9.5 10*3/uL (ref 4.0–10.5)
nRBC: 0 % (ref 0.0–0.2)

## 2021-06-14 LAB — GLUCOSE, CAPILLARY
Glucose-Capillary: 73 mg/dL (ref 70–99)
Glucose-Capillary: 124 mg/dL — ABNORMAL HIGH (ref 70–99)
Glucose-Capillary: 111 mg/dL — ABNORMAL HIGH (ref 70–99)
Glucose-Capillary: 181 mg/dL — ABNORMAL HIGH (ref 70–99)

## 2021-06-14 MED ORDER — HEPARIN SODIUM (PORCINE) 1000 UNIT/ML DIALYSIS: 1000.0000 [IU] | INTRAMUSCULAR | Status: DC | PRN

## 2021-06-14 MED ORDER — LIDOCAINE-PRILOCAINE 2.5-2.5 % EX CREA: 1.0000 "application " | TOPICAL_CREAM | CUTANEOUS | Status: DC | PRN

## 2021-06-14 MED ORDER — ALTEPLASE 2 MG IJ SOLR: 2.0000 mg | Freq: Once | INTRAMUSCULAR | Status: DC | PRN

## 2021-06-14 MED ORDER — HEPARIN SODIUM (PORCINE) 1000 UNIT/ML DIALYSIS
3000.0000 [IU] | Freq: Once | INTRAMUSCULAR | Status: DC
  Filled 2021-06-14: qty 3

## 2021-06-14 MED ORDER — PENTAFLUOROPROP-TETRAFLUOROETH EX AERO: 1.0000 "application " | INHALATION_SPRAY | CUTANEOUS | Status: DC | PRN

## 2021-06-14 MED ORDER — SODIUM CHLORIDE 0.9 % IV SOLN: 100.0000 mL | INTRAVENOUS | Status: DC | PRN

## 2021-06-14 MED ORDER — MELATONIN 3 MG PO TABS
3.0000 mg | ORAL_TABLET | Freq: Every day | ORAL | Status: DC
Start: 2021-06-14 — End: 2021-06-17
  Administered 2021-06-14 – 2021-06-16 (×3): 3 mg via ORAL
  Filled 2021-06-14 (×3): qty 1

## 2021-06-14 MED ORDER — LIDOCAINE HCL (PF) 1 % IJ SOLN: 5.0000 mL | INTRAMUSCULAR | Status: DC | PRN

## 2021-06-14 NOTE — Progress Notes (Signed)
Patient ID: Jonathan [name]., male   DOB: [birth year], 70 y.o.   MRN: [id number]  Met with pt and wife when here to answer questions wife had. She would like a fully electric hospital bed they have a manual one at home. Aware of the difference in cost. Will contact [employer] to exchange this. Pt does have 24/7 care at home between his wife and son and daughter in-law. Pt is sleepy today due to used to sleeping during the day, according to wife. Plan for MD to give sleep medicine so can sleep at night and be more awake for daytime therapies.

## 2021-06-14 NOTE — Progress Notes (Signed)
     Subjective:  Patient reports pain as well controlled this morning.  In rehab now. Mobility limited by weakness and pain. Tolerated dressing change without issues this morning.   Objective:   VITALS:   Vitals:   06/13/21 1319 06/13/21 1924 06/14/21 0238 06/14/21 0506  BP: (!) 149/72 (!) 146/73 140/70   Pulse: 99 100 96   Resp: 17 18 18    Temp: 98.5 F (36.9 C) 98.6 F (37 C) 98.3 F (36.8 C)   TempSrc: Oral Oral Oral   SpO2: 99% 100% 96%   Weight:    65.4 kg  Height:        Sensation intact distally Dorsiflexion/Plantar flexion intact Tolerates hip ROM with no significant discomfort. Incision: incision C/D/I, no erythema or drainage from incision. Healing well. Moderate expected postoperative swelling over the lateral hip Compartment soft    Lab Results  Component Value Date   WBC 9.5 06/14/2021   HGB 9.3 (L) 06/14/2021   HCT 28.2 (L) 06/14/2021   MCV 91.9 06/14/2021   PLT 304 06/14/2021   BMET    Component Value Date/Time   NA 130 (L) 06/14/2021 0338   K 5.8 (H) 06/14/2021 0338   CL 94 (L) 06/14/2021 0338   CO2 24 06/14/2021 0338   GLUCOSE 95 06/14/2021 0338   BUN 98 (H) 06/14/2021 0338   CREATININE 8.98 (H) 06/14/2021 0338   CALCIUM 8.6 (L) 06/14/2021 0338   GFRNONAA 6 (L) 06/14/2021 0338     Assessment/Plan:     Principal Problem:   Closed left subtrochanteric femur fracture, sequela Active Problems:   ESRD (end stage renal disease) on dialysis [facility])   Vascular dementia without behavioral disturbance ([name])   Hx of right BKA ([place])  Left hip hemiarthroplasty for femoral neck fracture  Post op recs: WB: WBAT with posterior hip precautions x6 weeks Abx: ancef x23 hours post op, followed by cefadroxil 500 twice daily for 7 days given increased risk for postop infection given the patient's medical comorbidities Dressing: Aquacel changed 11/7. Okay to remove in 2 weeks, 11/21 and leave incision open to air at that point. DVT prophylaxis: resumed  home eliquis Follow up: 4 weeks post discharge surgery for a wound check and xrays with Dr. Zachery Dakins at [facility].  Address: [address] [place], [place], [place] [address]  Office Phone: [phone]    Diane Hanel A Shermar Friedland 06/14/2021, 7:45 AM   Charlies Constable, MD Cell [phone]  Contact information:   N[facility] 7am-5pm epic message Dr. Zachery Dakins, or call office for patient follow up: [phone] After hours and holidays please check [email or web address] for group call information for [facility]

## 2021-06-14 NOTE — Progress Notes (Signed)
Occupational Therapy Session Note  Patient Details  Name: Jonathan EDGIN Sr. MRN: [id number] Date of Birth: [birth year]  Today's Date: 06/14/2021 OT Individual Time: 0730-0829 OT Individual Time Calculation (min): 59 min    Short Term Goals: Week 1:  OT Short Term Goal 1 (Week 1): Pt will actively participate in therapy sessions at least 70% of the time. OT Short Term Goal 2 (Week 1): Pt will demonstrate initiation by washing his face and UB with min A. OT Short Term Goal 3 (Week 1): Pt will demonstrate attention to task to self feed with min A.   Skilled Therapeutic Interventions/Progress Updates:    Pt greeted at time of session supine in bed resting, limited verbal communication and max encouragement to participate throughout session. Pt noted to have BM, brief change bed level with 2 assist from NT to help rolling 2/2 patient pushing and pain, did not rate pain throughout session but would say "ow" throughout. Positional changes and rest breaks provided with some relief noted. Donned pants bed level total A, supine > sit Max A and donned shirt EOB with Max A as well. Note truncal sway sitting EOB and Min/Mod to recover. Pt also offloading L hip in sitting and leaning to R. Total A for slide board placement and total A of 1 for slide board bed > wheelchair, note pt with poor motor planning and difficulty assisting in transfer. Self propel to sink and oral hygiene with Min A, needing cues for when to operate water, rinse mouth, etc. Washing face same manner. Pt resistant to tasks throughout session, needing extended time to process and max encouragement to participate. Wife present at this time and having questions regarding pt's participation, progress, and CLOF which were answered based on today's session but relayed more information would be shared after conference. Pt up in chair with alarm on and engaged, call bell in reach and all needs met.    Therapy Documentation Precautions:   Precautions Precautions: Posterior Hip, Fall Precaution Comments: provided precaution handouts Required Braces or Orthoses: Other Brace Other Brace: right prosthesis Restrictions Weight Bearing Restrictions: Yes RLE Weight Bearing: Weight bearing as tolerated LLE Weight Bearing: Weight bearing as tolerated Other Position/Activity Restrictions: Posterior hip precautions    Therapy/Group: Individual Therapy  Viona Gilmore 06/14/2021, 7:15 AM

## 2021-06-14 NOTE — Progress Notes (Signed)
Occupational Therapy Session Note  Patient Details  Name: Jonathan [name]. MRN: [id number] Date of Birth: 1951/04/13  Today's Date: 06/14/2021 OT Individual Time: 0905-1000 OT Individual Time Calculation (min): 55 min    Short Term Goals: Week 1:  OT Short Term Goal 1 (Week 1): Pt will actively participate in therapy sessions at least 70% of the time. OT Short Term Goal 2 (Week 1): Pt will demonstrate initiation by washing his face and UB with min A. OT Short Term Goal 3 (Week 1): Pt will demonstrate attention to task to self feed with min A.  Skilled Therapeutic Interventions/Progress Updates:    Pt received in wc with his wife [name] present. Pt much calmer and more agreeable to therapy.  His wife understands that she needs to be present for awhile for this week of therapy until patient becomes familiar with the staff. Overall pt much more agreeable today, but not verbal and occasionally expressed frustration when shown how to do something differently.   His wife stated she has been bathing and dressing him for a long time because "it is so much easier for me than trying to get him to participate". Her main goal of his therapy is to get him walking before he goes home.  She has not used a toilet or BSC for a long time as he is often incontinent of bowel and she changes his brief.   Pt asked to don his prosthesis. With cues for steps from his wife, he was able to don the elastic liner, sock, and prosthesis with mod A.  He needed max A with L shoe.    Attempted 3 x to have pt do a sit to stand to RW but he was not able to power up enough from sitting using 1 hand on wc arm rest.  He was not comfortable trying with 2 hands.  Placed pt facing side of bed and raised bed rail.  His wife stood on his R side and I stood on his left. Using his hands on rail he was  able to pull to stand with mod-max A of 1. Once standing he needed cues to stand fully upright and then was able to stand with min A  using BUE support on rail for approx. 30 seconds for a total of 3 times.  No c/o pain with the movements.   Pt resting in wc with wife in the room.     Therapy Documentation Precautions:  Precautions Precautions: Posterior Hip, Fall Precaution Comments: provided precaution handouts Required Braces or Orthoses: Other Brace Other Brace: right prosthesis Restrictions Weight Bearing Restrictions: Yes RLE Weight Bearing: Weight bearing as tolerated LLE Weight Bearing: Weight bearing as tolerated Other Position/Activity Restrictions: Posterior hip precautions Pain: Pain Assessment Pain Scale: 0-10 Pain Score: 8  Pain Type: Acute pain Pain Location: Leg Pain Orientation: Left Pain Descriptors / Indicators: Aching Pain Frequency: Intermittent Pain Onset: With Activity Pain Intervention(s): Medication (See eMAR)    Therapy/Group: Individual Therapy  [place] 06/14/2021, 12:18 PM

## 2021-06-14 NOTE — Progress Notes (Signed)
Physical Therapy Session Note  Patient Details  Name: Jonathan PHILBROOK Sr. MRN: [id number] Date of Birth: 11/09/1950  Today's Date: 06/14/2021 PT Individual Time: 1015-1125 PT Individual Time Calculation (min): 70 min   Short Term Goals: Week 1:  PT Short Term Goal 1 (Week 1): STG = LTG due to ELOS  Skilled Therapeutic Interventions/Progress Updates:  Pt received seated in WC in room, wife present. Pt unresponsive to all questioning, but verbalized pain throughout session by saying "ow". Nursing notified to administer pain meds. Emphasis of session on standing tolerance and family education on transfers. Pt performed sit <>stands x5 w/45s hold using bedrail w/2 helpers. Noted significant kyphotic posture, over reliance on BUEs and inability to facilitate cervical extension. Max verbal and tactile cues for glute activation and forward head, pt became very agitated and yelled at therapist with various phrases including "stop touching me", "NO!!!", and "I want to go home". Wife attempted to distract pt during stands, but pt proceeded to yell at wife as well. Upon inquiring about pt's goals, pt shook his head no regarding all therapist questions, indicating he no longer wishes to walk or stand. Pt shook his head yes when asked if he was scared to stand/walk, lengthy discussion regarding role of CIR and building up confidence to walk to improve independence and reduce caregiver burden. Pt repeatedly shook head no.   Practiced lateral slide board transfer from [facility] to bed on R side w/2 helpers. Pt demonstrated improved ability to slide across board without verbal or tactile cues, but immediately laid down on board halfway through transfer, requiring total A x2 for trunk support. Pt became very agitated during transfer, requiring total A x2 to complete. Once at EOB, provided cues for pt to scoot hips towards [facility], pt unable to perform. Sit <>supine w/heavy encouragement and CGA. Scoot to [facility] w/total A x2. Pt was  left supine in bed w/wife and nursing, all needs in reach.   Therapy Documentation Precautions:  Precautions Precautions: Posterior Hip, Fall Precaution Comments: provided precaution handouts Required Braces or Orthoses: Other Brace Other Brace: right prosthesis Restrictions Weight Bearing Restrictions: Yes RLE Weight Bearing: Weight bearing as tolerated LLE Weight Bearing: Weight bearing as tolerated Other Position/Activity Restrictions: Posterior hip precautions   Therapy/Group: Individual Therapy [name] Taejah Ohalloran, PT, DPT  06/14/2021, 7:49 AM

## 2021-06-14 NOTE — Progress Notes (Signed)
PROGRESS NOTE   Subjective/Complaints: Sleeping during the day and not much at night. Scheduled melatonin 3mg  at 10pm, when patient like to sleep.  Wife notes right upper extremity to be more swollen. Requested nursing to cut off IB bandage and replace  ROS: Limited due to cognitive/behavioral    Objective:   No results found. Recent Labs    06/14/21 0338  WBC 9.5  HGB 9.3*  HCT 28.2*  PLT 304   Recent Labs    06/14/21 0338  NA 130*  K 5.8*  CL 94*  CO2 24  GLUCOSE 95  BUN 98*  CREATININE 8.98*  CALCIUM 8.6*    Intake/Output Summary (Last 24 hours) at 06/14/2021 1118 Last data filed at 06/14/2021 0734 Gross per 24 hour  Intake 594 ml  Output --  Net 594 ml        Physical Exam: Vital Signs Blood pressure 140/70, pulse 96, temperature 98.3 F (36.8 C), temperature source Oral, resp. rate 18, height 5\' 9"  (1.753 m), weight 65.4 kg, SpO2 96 %.   General: No acute distress Mood and affect are appropriate Heart: Regular rate and rhythm no rubs murmurs or extra sounds Lungs: Clear to auscultation, breathing unlabored, no rales or wheezes Abdomen: Positive bowel sounds, soft nontender to palpation, nondistended Extremities: Right upper extremity with significant swelling Skin: No evidence of breakdown, no evidence of rash   Skin:right bka well healed. Left hip wound cdi with immediate post-op dressing in place. Redness buttocks/perineal areas Neuro:  slow to process. Fair insight and awareness. Told me his wife built his prosthesis Musculoskeletal: right BKA well shaped.     Assessment/Plan: 1. Functional deficits which require 3+ hours per day of interdisciplinary therapy in a comprehensive inpatient rehab setting. Physiatrist is providing close team supervision and 24 hour management of active medical problems listed below. Physiatrist and rehab team continue to assess barriers to discharge/monitor  patient progress toward functional and medical goals  Care Tool:  Bathing  Bathing activity did not occur: Refused           Bathing assist       Upper Body Dressing/Undressing Upper body dressing Upper body dressing/undressing activity did not occur (including orthotics): Refused What is the patient wearing?: Hospital gown only    Upper body assist Assist Level: Moderate Assistance - Patient 50 - 74%    Lower Body Dressing/Undressing Lower body dressing    Lower body dressing activity did not occur: Refused What is the patient wearing?: Hospital gown only, Incontinence brief     Lower body assist Assist for lower body dressing: Maximal Assistance - Patient 25 - 49%     Toileting Toileting Toileting Activity did not occur (Clothing management and hygiene only): N/A (no void or bm)  Toileting assist Assist for toileting: Total Assistance - Patient < 25%     Transfers Chair/bed transfer  Transfers assist  Chair/bed transfer activity did not occur: Refused        Locomotion Ambulation   Ambulation assist   Ambulation activity did not occur: Refused          Walk 10 feet activity   Assist  Walk 10 feet activity  did not occur: Refused        Walk 50 feet activity   Assist Walk 50 feet with 2 turns activity did not occur: Refused         Walk 150 feet activity   Assist Walk 150 feet activity did not occur: Refused         Walk 10 feet on uneven surface  activity   Assist Walk 10 feet on uneven surfaces activity did not occur: Refused         Wheelchair     Assist Is the patient using a wheelchair?: Yes Type of Wheelchair: Manual Wheelchair activity did not occur: Refused         Wheelchair 50 feet with 2 turns activity    Assist    Wheelchair 50 feet with 2 turns activity did not occur: Refused       Wheelchair 150 feet activity     Assist  Wheelchair 150 feet activity did not occur: Refused        Blood pressure 140/70, pulse 96, temperature 98.3 F (36.8 C), temperature source Oral, resp. rate 18, height 5\' 9"  (1.753 m), weight 65.4 kg, SpO2 96 %.  Medical Problem List and Plan: 1.  L hemiarthroplasty of L hip  secondary to L hip fracture, hx of right BKA- pt states he does not use prosthesis             -patient may  shower               -ELOS/Goals: mod to min A 18-22 days  Continue CIR PT and OT 2.  Antithrombotics: -DVT/anticoagulation:  Pharmaceutical: Other (comment)--Eliquis bid             -antiplatelet therapy: Resume ASA.  3. Postoperative pain:  Hydrocodone prn but not able to express needs/wants             --scheduled a dose of hydrocodone every morning and monitor for tolerance.  4. Mood: LCSW to follow for evaluation and support.              -antipsychotic agents: N/a 5. Neuropsych: This patient is not capable of making decisions on his own behalf.  -monitor cognition/affect. ?baseline 6. Skin/Wound Care:   Cefadroxil 500 mg/every 36 hour for wound prophylaxis- completed.               --Nystatin cream and powder to perineum and penile meatus.              --Gerhardt's butt cream to sacrum  7. Fluids/Electrolytes/Nutrition: strict I/O. Add 1200 cc FR.  --Discontinue ensure as nephro also on board.  8. T2DM: Monitor BS ac/hs and use SSI for elevated BS. 9. ESRD: Schedule HD at the end of the day MWF to  help with tolerance of therapy.  11/4 Unfortunately on HD this morning d/t scheduling issues             --Strict I/O. Daily wts. Renvela for metabolic bone disease 10. CAD/ICM: Monitor for symptoms with increase in activity. No meds to avoid hypotension w/HD.              --Continue  Lipitor.  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  12. Acute on chronic anemia: Monitor H/H with HD labs. Now on Aranesp weekly  13. Diarrhea: Felt to be due to antibiotics.  --added fibercon for bulking.  14. Acute on chronic insomnia: scheduled Trazodone 25  mg/hs             --  sleep chart. 15. Food pocketing?- needs D2 diet per wife.   16.  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 Left upper extremity swelling: Requested RN to ice and elevate and to get new ID bandage 19. Insomnia: scheduled melatonin 3mg  HS, ordered therapeutic grounds pass, and requested therapy to take patient out during the day when possible.  LOS: 4 days A FACE TO FACE EVALUATION WAS PERFORMED  Smriti Barkow P Onesty Clair 06/14/2021, 11:18 AM

## 2021-06-14 NOTE — Progress Notes (Addendum)
[facility] Progress Note   Subjective: Seen in room. HD today on schedule. Swelling noted RUE but has ID band that is so tight, it is compromising circulation. Removed. Will elevate RUE today.   Objective Vitals:   06/13/21 1319 06/13/21 1924 06/14/21 0238 06/14/21 0506  BP: (!) 149/72 (!) 146/73 140/70   Pulse: 99 100 96   Resp: 17 18 18    Temp: 98.5 F (36.9 C) 98.6 F (37 C) 98.3 F (36.8 C)   TempSrc: Oral Oral Oral   SpO2: 99% 100% 96%   Weight:    65.4 kg  Height:       Physical Exam General: Older male with flat affect, NAD Heart:S1,S2 no M/R/G Lungs: CTAB A/P  No WOB Abdomen: NABS, NT Extremities: R BKA no stump edema. LLE without edema.  Dialysis Access: R AVF +T/B mild edema.     Additional Objective Labs: Basic Metabolic Panel: Recent Labs  Lab 06/09/21 0217 06/11/21 0634 06/14/21 0338  NA 135 137 130*  K 5.1 5.2* 5.8*  CL 96* 97* 94*  CO2 29 29 24   GLUCOSE 180* 67* 95  BUN 77* 76* 98*  CREATININE 8.02* 7.58* 8.98*  CALCIUM 8.5* 9.0 8.6*  PHOS 3.8 4.5 4.0   Liver Function Tests: Recent Labs  Lab 06/09/21 0217 06/11/21 0634 06/14/21 0338  ALBUMIN 2.4* 2.4* 2.4*   No results for input(s): LIPASE, AMYLASE in the last 168 hours. CBC: Recent Labs  Lab 06/08/21 0219 06/09/21 0217 06/11/21 0634 06/14/21 0338  WBC 6.3 7.2 8.6 9.5  NEUTROABS 4.8  --   --   --   HGB 8.3* 7.2* 9.8* 9.3*  HCT 24.6* 22.2* 30.4* 28.2*  MCV 89.1 90.2 93.0 91.9  PLT 170 177 248 304   Blood Culture    Component Value Date/Time   SDES BLOOD SITE NOT SPECIFIED 10/30/2020 1307   SPECREQUEST  10/30/2020 1307    BOTTLES DRAWN AEROBIC ONLY Blood Culture results may not be optimal due to an inadequate volume of blood received in culture bottles   CULT  10/30/2020 1307    NO GROWTH 5 DAYS Performed at [facility] [address], [place], [place] [address]    REPTSTATUS 11/04/2020 FINAL 10/30/2020 1307    Cardiac Enzymes: No results for  input(s): CKTOTAL, CKMB, CKMBINDEX, TROPONINI in the last 168 hours. CBG: Recent Labs  Lab 06/13/21 0606 06/13/21 1131 06/13/21 1633 06/13/21 2112 06/14/21 0626  GLUCAP 91 153* 144* 167* 73   Iron Studies: No results for input(s): IRON, TIBC, TRANSFERRIN, FERRITIN in the last 72 hours. @lablastinr3 @ Studies/Results: No results found. Medications:   allopurinol  100 mg Oral QHS   apixaban  5 mg Oral BID   aspirin  81 mg Oral Daily   atorvastatin  40 mg Oral QHS   Chlorhexidine Gluconate Cloth  6 each Topical Q0600   darbepoetin (ARANESP) injection - DIALYSIS  100 mcg Intravenous Q Mon-HD   feeding supplement (NEPRO CARB STEADY)  237 mL Oral QHS   folic acid  1 mg Oral Daily   Gerhardt's butt cream   Topical QID   HYDROcodone-acetaminophen  1 tablet Oral Q breakfast   insulin aspart  0-9 Units Subcutaneous TID AC & HS   multivitamin  1 tablet Oral QHS   nystatin   Topical TID   nystatin cream   Topical BID   polycarbophil  1,250 mg Oral Daily   predniSONE  5 mg Oral Q breakfast   sevelamer carbonate  800 mg Oral TID WC   Dialysis Orders:  Center: [facility]  on MWF . 180NRe, 4 hour 41min, BFR 400, DFR Auto 1.5, EDW 61.7kg, 2K, 2Ca, AVF 15g Heparin 3000 unit bolus  Calcitriol 1.18mcg PO q HD  Assessment/Plan Left femoral neck fracture: s/p fixation per orthopedic surgery 10/28. Now in CIR.  ESRD:  HD MWF. Next HD 11/7  Right upper arm/AV fistula swelling: IR consulted to evaluate.  Went for F'gram 11/1. No intervention. Increased swelling today, removed ID band, will elevate and follow. +T/B Hypertension/volume: No evidence of volume excess by exam. BP controlled. UF as tolerated.  Anemia: S/P 2 unit of PRBCs 06/07/2021.  1 unit on 11/2.  Give Aranesp with HD 11/7. Hgb 9.3  Metabolic bone disease: Ca+/Po4 at goal. continue calcitriol and binders.  Nutrition: ALB 2.4 protein supplement to diet, renal/carb modified CAD/a.fib/hx of LV thrombus: cardiology consulted  cleared for hip  surgery . no chest pain or dyspnea, back on apixaban, he is stable /cardiology signed off 10/29  Failed KT on prednisone Multi infarct Dementia- per primary  Talasia Saulter H. Rahmon Heigl NP-C 06/14/2021, 9:05 AM  [employer] [phone]

## 2021-06-15 ENCOUNTER — Ambulatory Visit: Payer: Medicare Other | Admitting: Interventional Cardiology

## 2021-06-15 ENCOUNTER — Encounter: Payer: Medicare Other | Admitting: Physical Therapy

## 2021-06-15 LAB — GLUCOSE, CAPILLARY
Glucose-Capillary: 81 mg/dL (ref 70–99)
Glucose-Capillary: 140 mg/dL — ABNORMAL HIGH (ref 70–99)
Glucose-Capillary: 145 mg/dL — ABNORMAL HIGH (ref 70–99)
Glucose-Capillary: 187 mg/dL — ABNORMAL HIGH (ref 70–99)

## 2021-06-15 MED ORDER — HYDROCODONE-ACETAMINOPHEN 5-325 MG PO TABS: 1.0000 | ORAL_TABLET | Freq: Three times a day (TID) | ORAL | Status: DC | PRN

## 2021-06-15 NOTE — Progress Notes (Addendum)
Patient ID: Jonathan [name]., male   DOB: 07/13/1951, 70 y.o.   MRN: [id number] Referral sent to [facility] to switch out pt's manual bed to fully electric bed per wife's request. Will update once team conference  1:00 PM Met with pt and wife to update regarding team conference goals of mod level and discharge date of 11/16. Wife was asking about follow up since he was going to prothetic clinic will call Shirlean Mylar and ask if appropriate for this since not standing yet.

## 2021-06-15 NOTE — Progress Notes (Addendum)
[facility] Progress Note   Subjective: sleeping, opens eyes, nods and back to sleep. No issues reported with HD 06/14/2021. Next HD 06/16/2021   Objective Vitals:   06/14/21 1740 06/14/21 1750 06/15/21 0500 06/15/21 0522  BP: 130/69 132/69  131/70  Pulse: 87 88  95  Resp: 14 14    Temp:    98.6 F (37 C)  TempSrc:      SpO2:    100%  Weight:   61.1 kg   Height:       Physical Exam General: Older male with flat affect, NAD Heart:S1,S2 no M/R/G Lungs: CTAB A/P  No WOB Abdomen: NABS, NT Extremities: R BKA no stump edema. LLE without edema.  Dialysis Access: R AVF +T/B mild edema improved from yesterday   Additional Objective Labs: Basic Metabolic Panel: Recent Labs  Lab 06/09/21 0217 06/11/21 0634 06/14/21 0338  NA 135 137 130*  K 5.1 5.2* 5.8*  CL 96* 97* 94*  CO2 29 29 24   GLUCOSE 180* 67* 95  BUN 77* 76* 98*  CREATININE 8.02* 7.58* 8.98*  CALCIUM 8.5* 9.0 8.6*  PHOS 3.8 4.5 4.0   Liver Function Tests: Recent Labs  Lab 06/09/21 0217 06/11/21 0634 06/14/21 0338  ALBUMIN 2.4* 2.4* 2.4*   No results for input(s): LIPASE, AMYLASE in the last 168 hours. CBC: Recent Labs  Lab 06/09/21 0217 06/11/21 0634 06/14/21 0338  WBC 7.2 8.6 9.5  HGB 7.2* 9.8* 9.3*  HCT 22.2* 30.4* 28.2*  MCV 90.2 93.0 91.9  PLT 177 248 304   Blood Culture    Component Value Date/Time   SDES BLOOD SITE NOT SPECIFIED 10/30/2020 1307   SPECREQUEST  10/30/2020 1307    BOTTLES DRAWN AEROBIC ONLY Blood Culture results may not be optimal due to an inadequate volume of blood received in culture bottles   CULT  10/30/2020 1307    NO GROWTH 5 DAYS Performed at [facility], [place] [address], [place], [place] [address]    REPTSTATUS 11/04/2020 FINAL 10/30/2020 1307    Cardiac Enzymes: No results for input(s): CKTOTAL, CKMB, CKMBINDEX, TROPONINI in the last 168 hours. CBG: Recent Labs  Lab 06/14/21 0626 06/14/21 1118 06/14/21 1831 06/14/21 2117  06/15/21 0600  GLUCAP 73 124* 111* 181* 81   Iron Studies: No results for input(s): IRON, TIBC, TRANSFERRIN, FERRITIN in the last 72 hours. @lablastinr3 @ Studies/Results: No results found. Medications:  sodium chloride     sodium chloride      allopurinol  100 mg Oral QHS   apixaban  5 mg Oral BID   aspirin  81 mg Oral Daily   atorvastatin  40 mg Oral QHS   Chlorhexidine Gluconate Cloth  6 each Topical Q0600   darbepoetin (ARANESP) injection - DIALYSIS  100 mcg Intravenous Q Mon-HD   feeding supplement (NEPRO CARB STEADY)  237 mL Oral QHS   folic acid  1 mg Oral Daily   Gerhardt's butt cream   Topical QID   heparin  3,000 Units Dialysis Once in dialysis   HYDROcodone-acetaminophen  1 tablet Oral Q breakfast   insulin aspart  0-9 Units Subcutaneous TID AC & HS   melatonin  3 mg Oral QHS   multivitamin  1 tablet Oral QHS   nystatin   Topical TID   nystatin cream   Topical BID   polycarbophil  1,250 mg Oral Daily   predniSONE  5 mg Oral Q breakfast   sevelamer carbonate  800 mg Oral TID WC  Dialysis Orders:  Center: [facility]  on MWF . 180NRe, 4 hour 16min, BFR 400, DFR Auto 1.5, EDW 61.7kg, 2K, 2Ca, AVF 15g Heparin 3000 unit bolus  Calcitriol 1.60mcg PO q HD   Assessment/Plan Left femoral neck fracture: s/p fixation per orthopedic surgery 10/28. Now in CIR.  ESRD:  HD MWF. Next HD 06/16/2021 Right upper arm/AV fistula swelling: IR consulted to evaluate.  Went for F'gram 11/1. No intervention. Increased swelling, removed ID band 06/14/2021. Looks better today. No issues with cannulation. +T/B Hypertension/volume: No evidence of volume excess by exam. BP controlled. Net UF 2 liters with HD 06/14/21. Wt today 61.1 kg. UF as tolerated.  Anemia: S/P 2 unit of PRBCs 06/07/2021.  1 unit on 11/2.  Give Aranesp with HD 11/7. Hgb 9.3  Metabolic bone disease: Ca+/Po4 at goal. continue calcitriol and binders.  Nutrition: ALB 2.4 protein supplement to diet, renal/carb  modified CAD/a.fib/hx of LV thrombus: cardiology consulted  cleared for hip surgery . 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 Brown NP-C 06/15/2021, 8:50 AM  [employer] [phone]  I personally saw and evaluated the patient and agree with the assessment and plan by Juanell Fairly, NP.  Seen during OT and wife present.  No new issues.  HD tomorrow.  Jannifer Hick MD [facility] Pager [phone]

## 2021-06-15 NOTE — Progress Notes (Signed)
Physical Therapy Session Note  Patient Details  Name: Jonathan FESTER Sr. MRN: [id number] Date of Birth: 10/19/1950  Today's Date: 06/15/2021 PT Individual Time: 8768-1157; 2620-3559 PT Individual Time Calculation (min): 73 min and 58 min  Short Term Goals: Week 1:  PT Short Term Goal 1 (Week 1): STG = LTG due to ELOS  Skilled Therapeutic Interventions/Progress Updates:  Session 1 Pt received seated in WC in room, wife present. Pt denied pain and was agreeable to PT. Emphasis of session on standing tolerance and transfers for improved independence. Pt self-propelled >150' from room to main gym w/S*, noted efficient propulsion technique and good navigation of turns/obstacles without cues. Min verbal cues to encourage pt to continue to propel until we reached gym.   In // bars for improved standing tolerance, balance and visual biofeedback for body position: -Pt performed 5 sit <>stands w/20-60s hold w/BUE support. Initially required max A x2 but quickly progressed to mod A x1 for facilitation of hip extension and min verbal cues for upright posture. Placed mirror in front of pt for visual biofeedback on body position and to promote upright posture. Pt demonstrated improved posture, improved quad control bilaterally and decreased reliance on BUEs compared to previous session.   Pt transported to ortho gym w/total A for time management and performed lateral slide board transfer from [facility] to car on L side w/mod A for LLE management, trunk support and placement of board. Pt very familiar with transfer and requires min verbal cues but extra time for initiation and completion of task. Pt demonstrated improvement in problem solving techniques without cues and trunk control.  Educated pt and wife on proper positioning in [facility] to promote upright posture and decrease risk of pressure injury. Pt sits w/posterior pelvic tilt which further exaggerates thoracic and cervical kyphosis. Provided minor correctional cues  to pt's pelvis and adjusted back support to promote cervical extension and reduction of thoracic kyphosis.   Pt transported back to room w/total A and was left seated in WC in room, ice pack on L hip, all needs in reach.   Session 2 Pt received seated in WC in room, wife present. Pt denied pain and agreeable to PT. Emphasis of session on improved mood and improved rapport w/therapist. Pt transported to first floor outdoor patio w/total A to be in the sunlight to help reset circadian rhythm and improve participation in therapy. Session spent speaking with pt about his interests, family and goals in rehab. Pt very responsive to questions and attentive to therapist. Pt transported back to room w/total A and performed lateral scoot transfer from [facility] to bed on R side w/mod A. Doffed L shoe w/total A and R prosthetic w/mod A. Pt performed sit <>supine w/min A for LLE management due to fatigue. Pt reported 8/10 pain in L hip, nursing notified to administer pain meds. Pt was left supine in bed, wife present, ice pack on L hip for pain relief and all needs in reach.   Therapy Documentation Precautions:  Precautions Precautions: Posterior Hip, Fall Precaution Comments: provided precaution handouts Required Braces or Orthoses: Other Brace Other Brace: right prosthesis Restrictions Weight Bearing Restrictions: Yes RLE Weight Bearing: Non weight bearing LLE Weight Bearing: Weight bearing as tolerated Other Position/Activity Restrictions: Posterior hip precautions   Therapy/Group: Individual Therapy [name] Daden Mahany, PT, DPT  06/15/2021, 7:47 AM

## 2021-06-15 NOTE — Progress Notes (Signed)
Occupational Therapy Session Note  Patient Details  Name: Jonathan PRYCE Sr. MRN: [id number] Date of Birth: [birth year]  Today's Date: 06/15/2021 OT Individual Time: 0930-1030 OT Individual Time Calculation (min): 60 min    Short Term Goals: Week 1:  OT Short Term Goal 1 (Week 1): Pt will actively participate in therapy sessions at least 70% of the time. OT Short Term Goal 2 (Week 1): Pt will demonstrate initiation by washing his face and UB with min A. OT Short Term Goal 3 (Week 1): Pt will demonstrate attention to task to self feed with min A.  Skilled Therapeutic Interventions/Progress Updates:    See ADL documentation below, Pt received in bed with wife present.  For first 30 min of session +2 A. Pt incontinent of urine so he worked on rolling for brief change. Had pt self cleanse front perineal area. Donned pants from bed with max A.   Sat to EOB with cues to push up with arms with mod A.  Placed slide board under thighs and with mod cues and extra time pt able to transfer to wc with CGA and +2 guarding pt to ensure he did not lean back and to stabilize the equipment.    Once in wc pt placed at sink for oral care with cues, UB bathing and dressing with min A and mod Cues.  Pt then donned prosthesis with min A and mod cues.    Worked on standing 1 x using bed rails to pull to stand with mod A then pt stood upright for about 30 seconds.   Improved participation today with spouse present.  Pt resting in wc with all needs bed with spouse with him.   Therapy Documentation Precautions:  Precautions Precautions: Posterior Hip, Fall Precaution Comments: provided precaution handouts Required Braces or Orthoses: Other Brace Other Brace: right prosthesis Restrictions Weight Bearing Restrictions: Yes RLE Weight Bearing: Non weight bearing LLE Weight Bearing: Weight bearing as tolerated Other Position/Activity Restrictions: Posterior hip precautions     Pain: Pain Assessment Pain  Scale: 0-10 Pain Score: 0-No pain ADL: ADL Grooming: Supervision/safety, Minimal cueing Where Assessed-Grooming: Sitting at sink Upper Body Bathing: Moderate cueing, Supervision/safety Where Assessed-Upper Body Bathing: Sitting at sink Lower Body Bathing: Moderate cueing, Maximal assistance Where Assessed-Lower Body Bathing: Bed level Upper Body Dressing: Minimal cueing, Minimal assistance Where Assessed-Upper Body Dressing: Sitting at sink Lower Body Dressing: Maximal assistance Where Assessed-Lower Body Dressing: Bed level ADL Comments: pt has improved participation with his wife present.   Therapy/Group: Individual Therapy  [place] 06/15/2021, 12:26 PM

## 2021-06-15 NOTE — Patient Care Conference (Signed)
Inpatient RehabilitationTeam Conference and Plan of Care Update Date: 06/15/2021   Time: 12:12 PM    Patient Name: Jonathan HOMEWOOD Sr.      Medical Record Number: [id number]  Date of Birth: August 03, 1951 Sex: Male         Room/Bed: 5Y09X/8P38S-50 Payor Info: Payor: [place] / Plan: BCBS MEDICARE / Product Type: *No Product type* /    Admit Date/Time:  06/10/2021  4:33 PM  Primary Diagnosis:  Closed left subtrochanteric femur fracture, sequela  Hospital Problems: Principal Problem:   Closed left subtrochanteric femur fracture, sequela Active Problems:   ESRD (end stage renal disease) on dialysis [facility])   Vascular dementia without behavioral disturbance ([place])   Hx of right BKA [facility])    Expected Discharge Date: Expected Discharge Date: 06/22/21  Team Members Present: Physician leading conference: Dr. Alger Simons Social Worker Present: Ovidio Kin, LCSW Nurse Present: Dorien Chihuahua, RN PT Present: Francena Hanly, PT OT Present: Meriel Pica, OT PPS Coordinator present : Gunnar Fusi, SLP     Current Status/Progress Goal Weekly Team Focus  Bowel/Bladder     Incontinent   Continent; HD M-W-F   Toileting prompted  Swallow/Nutrition/ Hydration             ADL's   max sit to stand, total of 2 with slide board transfers, total self care (pt was total at his PLOF)  mod sit to stand, mod toilet transfer, max bathing  ADL and mobility training, pt/family education   Mobility   Max-total A for all mobility, sit <>stands, board transfers  Mod A  Pt/family education, transfers, standing tolerance   Communication             Safety/Cognition/ Behavioral Observations            Pain     Pain controlled with norco prn   Pain at or below level 4 with prn meds   Monitor need for and effectiveness of prn meds  Skin     Excoriation to groin   Skin healing   Cream, barrier cream to groin, manage incontinence    Discharge Planning:  Pt was being cared for by  family prior to admission due to dementia. Was transferring better prior to hip fracture. Does better when wife is present she will be here during therapies.   Team Discussion: Patient presented with old BKA, Dementia, ESRD: HD M-W-F. Initially apathetic and refusing therapy sessions. Reported caregivers at home PTA helping wife provide for care.  Patient on target to meet rehab goals: Since wife has been present, patient is more engaged and participatory with therapy. Min assist for upper body ADLs even though noted he had caregivers or wife did most of care PTA. Patient is incontinent and requires total assist for management of bowel and bladder at present. Able to complete slide-board transfer.   *See Care Plan and progress notes for long and short-term goals.   Revisions to Treatment Plan:  Wife requested to be at therapy sessions to encourage and support patient   Teaching Needs: Safety, transfers, toileting, medications, skin care, secondary risk management, etc.  Current Barriers to Discharge: Decreased caregiver support, Home enviroment access/layout, Incontinence, Hemodialysis, and Behavior  Possible Resolutions to Barriers: Family education Continue caregivers at discharge     Medical Summary Current Status: hx of left hip fx s/p hip hemi. prior right bka but didn't use prosthesis. motivation an issue, esrd on hd  Barriers to Discharge: Medical stability;Behavior  Possible Resolutions to [employer]: wife present for therapies. HD after therapies. daily assesment of wounds and patient data   Continued Need for Acute Rehabilitation Level of Care: The patient requires daily medical management by a physician with specialized training in physical medicine and rehabilitation for the following reasons: Direction of a multidisciplinary physical rehabilitation program to maximize functional independence : Yes Medical management of patient stability for increased activity  during participation in an intensive rehabilitation regime.: Yes Analysis of laboratory values and/or radiology reports with any subsequent need for medication adjustment and/or medical intervention. : Yes   I attest that I was present, lead the team conference, and concur with the assessment and plan of the team.   Dorien Chihuahua B 06/15/2021, 4:16 PM

## 2021-06-15 NOTE — Progress Notes (Signed)
PROGRESS NOTE   Subjective/Complaints: Slept much better last night with melatonin- discussed with wife that this in an antioxidant we naturally produce and a safe supplement. Discussed that I have asked therapy to take patient outside when possible  ROS: Limited due to cognitive/behavioral    Objective:   No results found. Recent Labs    06/14/21 0338  WBC 9.5  HGB 9.3*  HCT 28.2*  PLT 304   Recent Labs    06/14/21 0338  NA 130*  K 5.8*  CL 94*  CO2 24  GLUCOSE 95  BUN 98*  CREATININE 8.98*  CALCIUM 8.6*    Intake/Output Summary (Last 24 hours) at 06/15/2021 1411 Last data filed at 06/15/2021 1254 Gross per 24 hour  Intake 240 ml  Output 2000 ml  Net -1760 ml        Physical Exam: Vital Signs Blood pressure 131/70, pulse 95, temperature 98.6 F (37 C), resp. rate 14, height 5\' 9"  (1.753 m), weight 61.1 kg, SpO2 100 %.   General: No acute distress Mood and affect are appropriate Heart: Regular rate and rhythm no rubs murmurs or extra sounds Lungs: Clear to auscultation, breathing unlabored, no rales or wheezes Abdomen: Positive bowel sounds, soft nontender to palpation, nondistended Extremities: Right upper extremity with significant swelling- stable from prior Skin: No evidence of breakdown, no evidence of rash   Skin:right bka well healed. Left hip wound cdi with immediate post-op dressing in place. Redness buttocks/perineal areas Neuro:  slow to process. Fair insight and awareness. Told me his wife built his prosthesis Musculoskeletal: right BKA well shaped.     Assessment/Plan: 1. Functional deficits which require 3+ hours per day of interdisciplinary therapy in a comprehensive inpatient rehab setting. Physiatrist is providing close team supervision and 24 hour management of active medical problems listed below. Physiatrist and rehab team continue to assess barriers to discharge/monitor  patient progress toward functional and medical goals  Care Tool:  Bathing  Bathing activity did not occur: Refused Body parts bathed by patient: Right arm, Left arm, Chest, Abdomen, Front perineal area, Face   Body parts bathed by helper: Buttocks, Right upper leg, Left upper leg, Right lower leg, Left lower leg     Bathing assist Assist Level: Moderate Assistance - Patient 50 - 74%     Upper Body Dressing/Undressing Upper body dressing Upper body dressing/undressing activity did not occur (including orthotics): Refused What is the patient wearing?: Pull over shirt    Upper body assist Assist Level: Minimal Assistance - Patient > 75%    Lower Body Dressing/Undressing Lower body dressing    Lower body dressing activity did not occur: Refused What is the patient wearing?: Incontinence brief, Pants     Lower body assist Assist for lower body dressing: Maximal Assistance - Patient 25 - 49%     Toileting Toileting Toileting Activity did not occur (Clothing management and hygiene only): N/A (no void or bm)  Toileting assist Assist for toileting: Total Assistance - Patient < 25%     Transfers Chair/bed transfer  Transfers assist  Chair/bed transfer activity did not occur: Refused  Chair/bed transfer assist level: 2 Helpers     Locomotion  Ambulation   Ambulation assist   Ambulation activity did not occur: Refused          Walk 10 feet activity   Assist  Walk 10 feet activity did not occur: Refused        Walk 50 feet activity   Assist Walk 50 feet with 2 turns activity did not occur: Refused         Walk 150 feet activity   Assist Walk 150 feet activity did not occur: Refused         Walk 10 feet on uneven surface  activity   Assist Walk 10 feet on uneven surfaces activity did not occur: Refused         Wheelchair     Assist Is the patient using a wheelchair?: Yes Type of Wheelchair: Manual Wheelchair activity did not occur:  Refused  Wheelchair assist level: Supervision/Verbal cueing, Set up assist      Wheelchair 50 feet with 2 turns activity    Assist    Wheelchair 50 feet with 2 turns activity did not occur: Refused   Assist Level: Supervision/Verbal cueing   Wheelchair 150 feet activity     Assist  Wheelchair 150 feet activity did not occur: Refused   Assist Level: Supervision/Verbal cueing   Blood pressure 131/70, pulse 95, temperature 98.6 F (37 C), resp. rate 14, height 5\' 9"  (1.753 m), weight 61.1 kg, SpO2 100 %.  Medical Problem List and Plan: 1.  L hemiarthroplasty of L hip  secondary to L hip fracture, hx of right BKA- pt states he does not use prosthesis             -patient may  shower               -ELOS/Goals: mod to min A 18-22 days  Continue CIR PT and OT 2.  Antithrombotics: -DVT/anticoagulation:  Pharmaceutical: Other (comment)--Eliquis bid             -antiplatelet therapy: Resume ASA.  3. Postoperative pain:  Decrease Hydrocodone to 1 tab q8H prn but not able to express needs/wants             --scheduled a dose of hydrocodone every morning and monitor for tolerance.  4. Mood: LCSW to follow for evaluation and support.              -antipsychotic agents: N/a 5. Neuropsych: This patient is not capable of making decisions on his own behalf.  -monitor cognition/affect. ?baseline 6. Skin/Wound Care:   Cefadroxil 500 mg/every 36 hour for wound prophylaxis- completed.               --Nystatin cream and powder to perineum and penile meatus.              --Gerhardt's butt cream to sacrum  7. Fluids/Electrolytes/Nutrition: strict I/O. Add 1200 cc FR.  --Discontinue ensure as nephro also on board.  8. T2DM: CBGs 81-181: continue to Monitor BS ac/hs and use SSI for elevated BS. 9. ESRD: Schedule HD at the end of the day MWF to  help with tolerance of therapy.              --Strict I/O. Daily wts. Continue Renvela for metabolic bone disease 10. CAD/ICM: Monitor for symptoms  with increase in activity. No meds to avoid hypotension w/HD.              --Continue  Lipitor.  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  12. Acute on chronic anemia: Monitor H/H with HD labs. Now on Aranesp weekly  13. Diarrhea: Felt to be due to antibiotics.  --added fibercon for bulking.  14. History of right BKA: examined and moving well 15. Food pocketing?- needs D2 diet per wife.   16.  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 Left upper extremity swelling: Requested RN to ice and elevate and to get new ID bandage 19. Insomnia: scheduled melatonin 3mg  HS, ordered therapeutic grounds pass, and requested therapy to take patient out during the day when possible.  LOS: 5 days A FACE TO FACE EVALUATION WAS PERFORMED  Clide Deutscher Rada Zegers 06/15/2021, 2:11 PM

## 2021-06-16 LAB — HEPATITIS B SURFACE ANTIBODY,QUALITATIVE: Hep B S Ab: NONREACTIVE

## 2021-06-16 LAB — GLUCOSE, CAPILLARY
Glucose-Capillary: 74 mg/dL (ref 70–99)
Glucose-Capillary: 121 mg/dL — ABNORMAL HIGH (ref 70–99)
Glucose-Capillary: 117 mg/dL — ABNORMAL HIGH (ref 70–99)
Glucose-Capillary: 146 mg/dL — ABNORMAL HIGH (ref 70–99)

## 2021-06-16 LAB — RENAL FUNCTION PANEL
Albumin: 2.3 g/dL — ABNORMAL LOW (ref 3.5–5.0)
Anion gap: 14 (ref 5–15)
BUN: 76 mg/dL — ABNORMAL HIGH (ref 8–23)
CO2: 25 mmol/L (ref 22–32)
Calcium: 8.5 mg/dL — ABNORMAL LOW (ref 8.9–10.3)
Chloride: 92 mmol/L — ABNORMAL LOW (ref 98–111)
Creatinine, Ser: 7.37 mg/dL — ABNORMAL HIGH (ref 0.61–1.24)
GFR, Estimated: 7 mL/min — ABNORMAL LOW (ref >60)
Glucose, Bld: 135 mg/dL — ABNORMAL HIGH (ref 70–99)
Phosphorus: 3.7 mg/dL (ref 2.5–4.6)
Potassium: 4.8 mmol/L (ref 3.5–5.1)
Sodium: 131 mmol/L — ABNORMAL LOW (ref 135–145)

## 2021-06-16 LAB — CBC
HCT: 25.7 % — ABNORMAL LOW (ref 39.0–52.0)
Hemoglobin: 8.3 g/dL — ABNORMAL LOW (ref 13.0–17.0)
MCH: 30.7 pg (ref 26.0–34.0)
MCHC: 32.3 g/dL (ref 30.0–36.0)
MCV: 95.2 fL (ref 80.0–100.0)
Platelets: 298 10*3/uL (ref 150–400)
RBC: 2.7 MIL/uL — ABNORMAL LOW (ref 4.22–5.81)
RDW: 18.8 % — ABNORMAL HIGH (ref 11.5–15.5)
WBC: 9 10*3/uL (ref 4.0–10.5)
nRBC: 0 % (ref 0.0–0.2)

## 2021-06-16 LAB — HEPATITIS B SURFACE ANTIGEN: Hepatitis B Surface Ag: NONREACTIVE

## 2021-06-16 MED ORDER — HYDROCODONE-ACETAMINOPHEN 5-325 MG PO TABS
1.0000 | ORAL_TABLET | Freq: Two times a day (BID) | ORAL | Status: DC | PRN
  Administered 2021-06-16: 1 via ORAL
  Filled 2021-06-16: qty 1

## 2021-06-16 MED ORDER — SODIUM CHLORIDE 0.9 % IV SOLN: 100.0000 mL | INTRAVENOUS | Status: DC | PRN

## 2021-06-16 MED ORDER — HEPARIN SODIUM (PORCINE) 1000 UNIT/ML DIALYSIS
3000.0000 [IU] | Freq: Once | INTRAMUSCULAR | Status: AC
  Administered 2021-06-16: 3000 [IU] via INTRAVENOUS_CENTRAL
  Filled 2021-06-16: qty 3

## 2021-06-16 NOTE — Progress Notes (Signed)
PROGRESS NOTE   Subjective/Complaints: Appreciate therapy taking Jonathan [name] outside today.  Insomnia improved with melatonin Team conference today  ROS: Limited due to cognitive/behavioral    Objective:   No results found. Recent Labs    06/14/21 0338 06/16/21 1324  WBC 9.5 9.0  HGB 9.3* 8.3*  HCT 28.2* 25.7*  PLT 304 298   Recent Labs    06/14/21 0338 06/16/21 1328  NA 130* 131*  K 5.8* 4.8  CL 94* 92*  CO2 24 25  GLUCOSE 95 135*  BUN 98* 76*  CREATININE 8.98* 7.37*  CALCIUM 8.6* 8.5*    Intake/Output Summary (Last 24 hours) at 06/16/2021 1840 Last data filed at 06/16/2021 1301 Gross per 24 hour  Intake 360 ml  Output --  Net 360 ml        Physical Exam: Vital Signs Blood pressure 129/62, pulse 93, temperature 97.7 F (36.5 C), temperature source Oral, resp. rate 16, height 5\' 9"  (1.753 m), weight 66.6 kg, SpO2 100 %.   General: No acute distress Mood and affect are appropriate Heart: Tachycardic Lungs: Clear to auscultation, breathing unlabored, no rales or wheezes Abdomen: Positive bowel sounds, soft nontender to palpation, nondistended Extremities: Right upper extremity with significant swelling- stable from prior Skin: No evidence of breakdown, no evidence of rash   Skin:right bka well healed. Left hip wound cdi with immediate post-op dressing in place. Redness buttocks/perineal areas Neuro:  slow to process. Fair insight and awareness. Told me his wife built his prosthesis Musculoskeletal: right BKA well shaped.     Assessment/Plan: 1. Functional deficits which require 3+ hours per day of interdisciplinary therapy in a comprehensive inpatient rehab setting. Physiatrist is providing close team supervision and 24 hour management of active medical problems listed below. Physiatrist and rehab team continue to assess barriers to discharge/monitor patient progress toward functional and medical  goals  Care Tool:  Bathing  Bathing activity did not occur: Refused Body parts bathed by patient: Right arm, Left arm, Chest, Abdomen, Front perineal area, Face   Body parts bathed by helper: Buttocks, Right upper leg, Left upper leg, Right lower leg, Left lower leg     Bathing assist Assist Level: Moderate Assistance - Patient 50 - 74%     Upper Body Dressing/Undressing Upper body dressing Upper body dressing/undressing activity did not occur (including orthotics): Refused What is the patient wearing?: Pull over shirt    Upper body assist Assist Level: Minimal Assistance - Patient > 75%    Lower Body Dressing/Undressing Lower body dressing    Lower body dressing activity did not occur: Refused What is the patient wearing?: Pants     Lower body assist Assist for lower body dressing: Maximal Assistance - Patient 25 - 49%     Toileting Toileting Toileting Activity did not occur (Clothing management and hygiene only): N/A (no void or bm)  Toileting assist Assist for toileting: Total Assistance - Patient < 25%     Transfers Chair/bed transfer  Transfers assist  Chair/bed transfer activity did not occur: Refused  Chair/bed transfer assist level: 2 Helpers (MIN A +2)     Locomotion Ambulation   Ambulation assist   Ambulation activity  did not occur: Refused          Walk 10 feet activity   Assist  Walk 10 feet activity did not occur: Refused        Walk 50 feet activity   Assist Walk 50 feet with 2 turns activity did not occur: Refused         Walk 150 feet activity   Assist Walk 150 feet activity did not occur: Refused         Walk 10 feet on uneven surface  activity   Assist Walk 10 feet on uneven surfaces activity did not occur: Refused         Wheelchair     Assist Is the patient using a wheelchair?: Yes Type of Wheelchair: Manual Wheelchair activity did not occur: Refused  Wheelchair assist level: Supervision/Verbal  cueing, Set up assist      Wheelchair 50 feet with 2 turns activity    Assist    Wheelchair 50 feet with 2 turns activity did not occur: Refused   Assist Level: Supervision/Verbal cueing   Wheelchair 150 feet activity     Assist  Wheelchair 150 feet activity did not occur: Refused   Assist Level: Supervision/Verbal cueing   Blood pressure 129/62, pulse 93, temperature 97.7 F (36.5 C), temperature source Oral, resp. rate 16, height 5\' 9"  (1.753 m), weight 66.6 kg, SpO2 100 %.  Medical Problem List and Plan: 1.  L hemiarthroplasty of L hip  secondary to L hip fracture, hx of right BKA- pt states he does not use prosthesis             -patient may  shower               -ELOS/Goals: mod to min A 18-22 days  Continue CIR PT and OT  -Interdisciplinary Team Conference today   2.  Antithrombotics: -DVT/anticoagulation:  Pharmaceutical: Other (comment)--Eliquis bid             -antiplatelet therapy: Resume ASA.  3. Postoperative pain:  decrease Hydrocodone to 1 tab q12H prn but not able to express needs/wants             --scheduled a dose of hydrocodone every morning and monitor for tolerance.  4. Mood: LCSW to follow for evaluation and support.              -antipsychotic agents: N/a 5. Neuropsych: This patient is not capable of making decisions on his own behalf.  -monitor cognition/affect. ?baseline 6. Skin/Wound Care:   Cefadroxil 500 mg/every 36 hour for wound prophylaxis- completed.               --Nystatin cream and powder to perineum and penile meatus.              --Gerhardt's butt cream to sacrum  7. Fluids/Electrolytes/Nutrition: strict I/O. Add 1200 cc FR.  --Discontinue ensure as nephro also on board.  8. T2DM: CBGs 81-181: continue to Monitor BS ac/hs and use SSI for elevated BS. 9. ESRD: Schedule HD at the end of the day MWF to  help with tolerance of therapy.              --Strict I/O. Daily wts. Continue Renvela for metabolic bone disease 10. CAD/ICM:  Monitor for symptoms with increase in activity. No meds to avoid hypotension w/HD.              --Continue  Lipitor.  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  12. Acute on chronic anemia: Monitor H/H with HD labs- reviewed and stable. Now on Aranesp weekly  13. Diarrhea: Felt to be due to antibiotics.  --added fibercon for bulking.  14. History of right BKA: examined and moving well 15. Food pocketing?- needs D2 diet per wife.   16.  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 Left upper extremity swelling: Requested RN to ice and elevate and to get new ID bandage 19. Insomnia: continue scheduled melatonin 3mg  HS, ordered therapeutic grounds pass, and requested therapy to take patient out during the day when possible.  LOS: 6 days A FACE TO FACE EVALUATION WAS PERFORMED  Jonathan [name] 06/16/2021, 6:40 PM

## 2021-06-16 NOTE — Progress Notes (Signed)
Occupational Therapy Session Note  Patient Details  Name: Jonathan VENEZIA Sr. MRN: [id number] Date of Birth: 17-Feb-1951  Today's Date: 06/16/2021 OT Individual Time: 5974-1638 OT Individual Time Calculation (min): 54 min    Short Term Goals: Week 1:  OT Short Term Goal 1 (Week 1): Pt will actively participate in therapy sessions at least 70% of the time. OT Short Term Goal 2 (Week 1): Pt will demonstrate initiation by washing his face and UB with min A. OT Short Term Goal 3 (Week 1): Pt will demonstrate attention to task to self feed with min A.   Skilled Therapeutic Interventions/Progress Updates:    Pt greeted at time of session supine in bed resting, stating brief needed to be changed but when checking brief, not soiled. Pt saying "ouch" inconsistently and with light tough throughout session but did not rank pain. RN aware of pain and that requested pain medication. Donned pants bed level with assist to thread LLE 2/2 hip precautions and pt able to thread RLE residual limb and rolling L/R with assist (limited by hip pain) to don over hips. Supine > sit extended time with pt insisting on doing it his way, partially coming into long sitting before scooting to EOB. Donned prosthetic with assist to fully click into place. Slide board bed > wheelchair with CGA/Min with 2nd helper guarding for safety and preventing posterior lean. Self propel to sink level and UB bathe/dress Min A overall. Wife present at this time and discussed CLOF and ADL performance this am. Remainder of session focused on sit > stand while holding bed rail with 2 helpers to come up to standing and able to stand approx 1 minute with cues for posture and relaxing shoulders with significant difficulty following through. Note pt required Max encouragement to participate, cues for sequencing and problem solving throughout. Up in chair alarm on call bell in reach.    Therapy Documentation Precautions:  Precautions Precautions:  Posterior Hip, Fall Precaution Comments: provided precaution handouts Required Braces or Orthoses: Other Brace Other Brace: right prosthesis Restrictions Weight Bearing Restrictions: Yes RLE Weight Bearing: Non weight bearing LLE Weight Bearing: Weight bearing as tolerated Other Position/Activity Restrictions: Posterior hip precautions     Therapy/Group: Individual Therapy  Viona Gilmore 06/16/2021, 7:13 AM

## 2021-06-16 NOTE — Progress Notes (Signed)
[facility] Progress Note   Subjective: No new issues reported. HD today on schedule.    Objective Vitals:   06/14/21 1750 06/15/21 0500 06/15/21 0522 06/16/21 0504  BP: 132/69  131/70 126/67  Pulse: 88  95 94  Resp: 14     Temp:   98.6 F (37 C) 97.9 F (36.6 C)  TempSrc:      SpO2:   100% 100%  Weight:  61.1 kg    Height:       Physical Exam General: Older male with flat affect, NAD Heart:S1,S2 no M/R/G Lungs: CTAB A/P  No WOB Abdomen: NABS, NT Extremities: R BKA no stump edema. LLE without edema.  Dialysis Access: R AVF +T/B mild edema    Additional Objective Labs: Basic Metabolic Panel: Recent Labs  Lab 06/11/21 0634 06/14/21 0338  NA 137 130*  K 5.2* 5.8*  CL 97* 94*  CO2 29 24  GLUCOSE 67* 95  BUN 76* 98*  CREATININE 7.58* 8.98*  CALCIUM 9.0 8.6*  PHOS 4.5 4.0   Liver Function Tests: Recent Labs  Lab 06/11/21 0634 06/14/21 0338  ALBUMIN 2.4* 2.4*   No results for input(s): LIPASE, AMYLASE in the last 168 hours. CBC: Recent Labs  Lab 06/11/21 0634 06/14/21 0338  WBC 8.6 9.5  HGB 9.8* 9.3*  HCT 30.4* 28.2*  MCV 93.0 91.9  PLT 248 304   Blood Culture    Component Value Date/Time   SDES BLOOD SITE NOT SPECIFIED 10/30/2020 1307   SPECREQUEST  10/30/2020 1307    BOTTLES DRAWN AEROBIC ONLY Blood Culture results may not be optimal due to an inadequate volume of blood received in culture bottles   CULT  10/30/2020 1307    NO GROWTH 5 DAYS Performed at [facility], [place] [address] [place], [place] [address]    REPTSTATUS 11/04/2020 FINAL 10/30/2020 1307    Cardiac Enzymes: No results for input(s): CKTOTAL, CKMB, CKMBINDEX, TROPONINI in the last 168 hours. CBG: Recent Labs  Lab 06/15/21 1154 06/15/21 1626 06/15/21 2128 06/16/21 0523 06/16/21 1149  GLUCAP 140* 145* 187* 74 121*   Iron Studies: No results for input(s): IRON, TIBC, TRANSFERRIN, FERRITIN in the last 72 hours. @lablastinr3 @ Studies/Results: No  results found. Medications:  sodium chloride     sodium chloride     sodium chloride     sodium chloride      allopurinol  100 mg Oral QHS   apixaban  5 mg Oral BID   aspirin  81 mg Oral Daily   atorvastatin  40 mg Oral QHS   Chlorhexidine Gluconate Cloth  6 each Topical Q0600   darbepoetin (ARANESP) injection - DIALYSIS  100 mcg Intravenous Q Mon-HD   feeding supplement (NEPRO CARB STEADY)  237 mL Oral QHS   folic acid  1 mg Oral Daily   Gerhardt's butt cream   Topical QID   heparin  3,000 Units Dialysis Once in dialysis   heparin  3,000 Units Dialysis Once in dialysis   HYDROcodone-acetaminophen  1 tablet Oral Q breakfast   insulin aspart  0-9 Units Subcutaneous TID AC & HS   melatonin  3 mg Oral QHS   multivitamin  1 tablet Oral QHS   nystatin   Topical TID   nystatin cream   Topical BID   polycarbophil  1,250 mg Oral Daily   predniSONE  5 mg Oral Q breakfast   sevelamer carbonate  800 mg Oral TID WC     Dialysis Orders:  Center: [facility]  on MWF . [facility]NRe, 4 hour 22min, BFR 400, DFR Auto 1.5, EDW 61.7kg, 2K, 2Ca, AVF 15g Heparin 3000 unit bolus  Calcitriol 1.42mcg PO q HD   Assessment/Plan Left femoral neck fracture: s/p fixation per orthopedic surgery 10/28. Now in CIR.  ESRD:  HD MWF. Next HD 06/16/2021 Right upper arm/AV fistula swelling: IR consulted to evaluate.  Went for F'gram 11/1. No intervention. Increased swelling, removed ID band 06/14/2021. Looks better today. No issues with cannulation. +T/B Hypertension/volume: No evidence of volume excess by exam. BP controlled.UF as tolerated.  Anemia: S/P 2 unit of PRBCs 06/07/2021.  1 unit on 11/2.  Give Aranesp with HD 11/7. Hgb 9.3 66/81/5947 Metabolic bone disease: Ca+/Po4 at goal. continue calcitriol and binders.  Nutrition: ALB 2.4 protein supplement to diet, renal/carb modified CAD/a.fib/hx of LV thrombus: cardiology consulted  cleared for hip surgery . no chest pain or dyspnea, back on apixaban, he is stable  /cardiology signed off 10/29  Failed KT on prednisone Multi infarct Dementia- per primary  Jonathan [name] H. Beautiful Pensyl NP-C 06/16/2021, 11:55 AM  [employer] [phone]

## 2021-06-16 NOTE — Progress Notes (Signed)
Physical Therapy Session Note  Patient Details  Name: Jonathan KNUPP Sr. MRN: [id number] Date of Birth: Aug 20, 1950  Today's Date: 06/16/2021 PT Individual Time: 0865-7846 PT Individual Time Calculation (min): 70 min   Short Term Goals: Week 1:  PT Short Term Goal 1 (Week 1): STG = LTG due to ELOS  Skilled Therapeutic Interventions/Progress Updates:  Pt received seated in WC. Pt reported no pain and was agreeable to therapy. Session focused on [facility] navigation, functional transfers, pre-gait activities and activity tolerance to promote independence. Session began with 2x STS with ModA +2 and B handrails. Pt then completed WC propulsion out of room for 50 ft with S. Pt taken outside for time conservation. Outside,  pt completed 3x 95ft WC propulsion on outdoor, concrete terrain. Pt required VC and intermittent minA during WC propulsion for navigation. Pt then transported to therapy gyn where he completed 3x sit to stand in parallel bars. From standing, pt completed amb of 72ft with modA + WC follow for the first round, and then 2x 6 reps marching in place for subsequent stands. Pt expressed strong fatigue from prior session, thus session shifted focus to upper body. Pt completed 5x 20sec on airbike with L1 and 30 sec rests between to promote activity tolerance. Pt returned to room and completed 3x6 B arm raises with L3 arm bar, as well as WC navigation in room around bed under S to demonstrate ability for navigating tight spaces. At end of session, pt was left seated in [facility] with call bell, alarm engaged, and all needs in reach.  Therapy Documentation Precautions:  Precautions Precautions: Posterior Hip, Fall Precaution Comments: provided precaution handouts Required Braces or Orthoses: Other Brace Other Brace: right prosthesis Restrictions Weight Bearing Restrictions: Yes RLE Weight Bearing: Non weight bearing LLE Weight Bearing: Weight bearing as tolerated Other Position/Activity Restrictions:  Posterior hip precautions  Pain: Pain Assessment Pain Scale: 0-10     Therapy/Group: Individual Therapy  Marquette Saa, PT, DPT 06/16/2021, 12:12 PM

## 2021-06-16 NOTE — Progress Notes (Signed)
Occupational Therapy Session Note  Patient Details  Name: Jonathan TEALL Sr. MRN: [id number] Date of Birth: 1951/04/20  Today's Date: 06/16/2021 OT Individual Time: 0254-2706 OT Individual Time Calculation (min): 68 min    Short Term Goals: Week 1:  OT Short Term Goal 1 (Week 1): Pt will actively participate in therapy sessions at least 70% of the time. OT Short Term Goal 2 (Week 1): Pt will demonstrate initiation by washing his face and UB with min A. OT Short Term Goal 3 (Week 1): Pt will demonstrate attention to task to self feed with min A.  Skilled Therapeutic Interventions/Progress Updates:  Pt greeted  seated in w/c needing encouragement to participate in OT session.  Session focus on education related to posterior hip precautions,functional transfers, w/c mobility to facilitate independence with functional mobility and BUE strength and endurance. Education provided on posterior hip precautions with pt able verbalize 1/3 precautions after education, utilized visual handout to increase carryover. Wife present during education and wife with great recall of precautions. Pt transported to therapy gym with total A for time mgmt. Pt completed below BUE therex with 4 ld dowel rod to increase BUE strength and endurance for ADL transfers. X10 OH presses X10 scapular protraction/retraction X10 forward rows Worked on SB transfers from w/c<>EOM with pt needing MIN A +2 for safety. Pt requires MOD step by step cues to sequence board placement and body mechanics. Additionally worked on [employer] and endurance via ball tosses with pt instructed to press ball OH and catch and release ball from w/c level, pt abel to complete task for 3 mins before reporting "ma'am I'm tired." Pt requested to transfer outside, worked on w/c propulsion with pt needing up to MOD hand over hand cues to sequence safe cadence during w/c propulsion and overall sequencing. Pt able to roll over uneven surface outside with MOD  cues. Pt reports being cold once outside, total A to transfer pt back to room for time mgmt. Pt completed additional SB transfer back to bed with MIN A +2 for safety. Pt required MOD A for bed mobility as pt tempted to break hip precautions trying to cross L leg over R.   pt left supine in bed with bed alarm activated, wife and NT present and all needs within reach.                       Therapy Documentation Precautions:  Precautions Precautions: Posterior Hip, Fall Precaution Comments: provided precaution handouts Required Braces or Orthoses: Other Brace Other Brace: right prosthesis Restrictions Weight Bearing Restrictions: Yes RLE Weight Bearing: Non weight bearing LLE Weight Bearing: Weight bearing as tolerated Other Position/Activity Restrictions: Posterior hip precautions  Pain: pt reports unrated pain in L hip, rest breaks provided as needed during session.      Therapy/Group: Individual Therapy  Corinne Ports [facility] 06/16/2021, 12:08 PM

## 2021-06-17 ENCOUNTER — Encounter: Payer: Medicare Other | Admitting: Physical Therapy

## 2021-06-17 LAB — GLUCOSE, CAPILLARY
Glucose-Capillary: 77 mg/dL (ref 70–99)
Glucose-Capillary: 159 mg/dL — ABNORMAL HIGH (ref 70–99)
Glucose-Capillary: 147 mg/dL — ABNORMAL HIGH (ref 70–99)
Glucose-Capillary: 156 mg/dL — ABNORMAL HIGH (ref 70–99)

## 2021-06-17 LAB — HEPATITIS B SURFACE ANTIBODY, QUANTITATIVE: Hep B S AB Quant (Post): 7 m[IU]/mL — ABNORMAL LOW (ref >9.9)

## 2021-06-17 MED ORDER — HYDROCODONE-ACETAMINOPHEN 5-325 MG PO TABS
1.0000 | ORAL_TABLET | Freq: Every day | ORAL | Status: DC | PRN
  Administered 2021-06-19 – 2021-06-22 (×2): 1 via ORAL
  Filled 2021-06-17 (×4): qty 1

## 2021-06-17 MED ORDER — MELATONIN 5 MG PO TABS
5.0000 mg | ORAL_TABLET | Freq: Every day | ORAL | Status: DC
  Administered 2021-06-17 – 2021-06-24 (×8): 5 mg via ORAL
  Filled 2021-06-17 (×8): qty 1

## 2021-06-17 NOTE — Progress Notes (Signed)
Orthopedic Tech Progress Note Patient Details:  [name] 08-16-1950 [id number] Called in order to [facility] Patient ID: Jonathan [name]., male   DOB: 1950/10/01, 69 y.o.   MRN: [id number]  [name] 06/17/2021, 1:43 PM

## 2021-06-17 NOTE — Progress Notes (Signed)
[facility] Progress Note   Subjective: No new issues reported. No family bedside this AM.   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 LLE without edema.  Dialysis Access: R AVF +T/B mild edema but improved today with overnight elevation   Additional Objective Labs: Basic Metabolic Panel: Recent Labs  Lab 06/11/21 0634 06/14/21 0338 06/16/21 1328  NA 137 130* 131*  K 5.2* 5.8* 4.8  CL 97* 94* 92*  CO2 29 24 25   GLUCOSE 67* 95 135*  BUN 76* 98* 76*  CREATININE 7.58* 8.98* 7.37*  CALCIUM 9.0 8.6* 8.5*  PHOS 4.5 4.0 3.7    Liver Function Tests: Recent Labs  Lab 06/11/21 0634 06/14/21 0338 06/16/21 1328  ALBUMIN 2.4* 2.4* 2.3*    No results for input(s): LIPASE, AMYLASE in the last 168 hours. CBC: Recent Labs  Lab 06/11/21 0634 06/14/21 0338 06/16/21 1324  WBC 8.6 9.5 9.0  HGB 9.8* 9.3* 8.3*  HCT 30.4* 28.2* 25.7*  MCV 93.0 91.9 95.2  PLT 248 304 298    Blood Culture    Component Value Date/Time   SDES BLOOD SITE NOT SPECIFIED 10/30/2020 1307   SPECREQUEST  10/30/2020 1307    BOTTLES DRAWN AEROBIC ONLY Blood Culture results may not be optimal due to an inadequate volume of blood received in culture bottles   CULT  10/30/2020 1307    NO GROWTH 5 DAYS Performed at [facility], [place] [address], [place], [place] [address]    REPTSTATUS 11/04/2020 FINAL 10/30/2020 1307    Cardiac Enzymes: No results for input(s): CKTOTAL, CKMB, CKMBINDEX, TROPONINI in the last 168 hours. CBG: Recent Labs  Lab 06/16/21 0523 06/16/21 1149  06/16/21 1818 06/16/21 2114 06/17/21 0617  GLUCAP 74 121* 117* 146* 77    Iron Studies: No results for input(s): IRON, TIBC, TRANSFERRIN, FERRITIN in the last 72 hours. @lablastinr3 @ Studies/Results: No results found. Medications:    allopurinol  100 mg Oral QHS   apixaban  5 mg Oral BID   aspirin  81 mg Oral Daily   atorvastatin  40 mg Oral QHS   Chlorhexidine Gluconate Cloth  6 each Topical Q0600   darbepoetin (ARANESP) injection - DIALYSIS  100 mcg Intravenous Q Mon-HD   feeding supplement (NEPRO CARB STEADY)  237 mL Oral QHS   folic acid  1 mg Oral Daily   Gerhardt's butt cream   Topical QID   HYDROcodone-acetaminophen  1 tablet Oral Q breakfast   insulin aspart  0-9 Units Subcutaneous TID AC & HS   melatonin  3 mg Oral QHS   multivitamin  1 tablet Oral QHS   nystatin   Topical TID   nystatin cream   Topical BID   polycarbophil  1,250 mg Oral Daily   predniSONE  5 mg Oral Q breakfast   sevelamer carbonate  800 mg Oral TID WC     Dialysis Orders:  Center: [facility]  on MWF . 180NRe,  4 hour 9min, BFR 400, DFR Auto 1.5, EDW 61.7kg, 2K, 2Ca, AVF 15g Heparin 3000 unit bolus  Calcitriol 1.47mcg PO q HD   Assessment/Plan Left femoral neck fracture: s/p fixation per orthopedic surgery 10/28. Now in CIR.  ESRD:  HD MWF. Next HD 06/18/2021 Right upper arm/AV fistula swelling: IR consulted to evaluate.  Went for F'gram 11/1 which was unrevealing and no intervention. Increased swelling, removed ID band 06/14/2021. Looks better today. No issues with cannulation. +T/B Hypertension/volume: No evidence of volume excess by exam. BP controlled.UF as tolerated.  Anemia: S/P 2 unit of PRBCs 06/07/2021.  1 unit on 11/2.  Give Aranesp with HD 11/7. Hgb 9.3 20/60/1561 Metabolic bone disease: Ca+/Po4 at goal. continue calcitriol and binders.  Nutrition: ALB 2.4 protein supplement to diet, renal/carb modified CAD/a.fib/hx of LV thrombus: cardiology consulted  cleared for hip surgery . 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

## 2021-06-17 NOTE — Progress Notes (Signed)
Occupational Therapy Session Note  Patient Details  Name: Jonathan [name] Sr. MRN: [id number] Date of Birth: 12-Apr-1951  Today's Date: 06/18/2021 OT Individual Time: 7017-7939 OT Individual Time Calculation (min): 53 min   Skilled Therapeutic Interventions/Progress Updates:    Pt greeted in bed, affect flat and a bit irritable, no pain reported at rest. He was agreeable to engage in ADLs. Close supervision for supine<sit with increased time to motor plan and execute motor demands. Once EOB pt required step by step cues for sequencing in regards to bathing UB/LB. He refused to attempt perihygiene. Min balance assistance when donning overhead shirt and Max A to reach is Lt foot in the context of self care. Pt stated "no" when asked to don prosthetic in order to elevate pants at sit<stand level. He returned to bed and rolled towards his Rt side. Noted at this time pt was incontinent of bowels. He rolled Rt>Lt with Mod A, frequently stating "ow" during hygiene/rolling/donning clean brief and pants though OT and RT were assisting slowly and gently. Pt stated "no" in an irritable way when asked to assist with frontal hygiene. Tried to have pt bridge to pull pants over hips however pt initiated rolling to do this. +2 for boosting him up in bed with pt assisting using the headboard. We notified RN using the call button of his request for pain medicine. Pt indicated that the Lt hip was bothering him at this time. Encouraged him to participate in bedlevel exercises but pt again stating "no" and appearing agitated by encouragement. Pt also completed oral care with supervision/cues today. He remained in bed at close of session, all needs within reach and bed alarm set. OT provided him with an ice pack for the Lt hip with nurse consent. Nurse agreed to check on pt within 20 minutes to remove ice pack if appropriate. Tx focus placed on self care retraining, sitting balance, motor planning, and functional cognition.    Therapy Documentation Precautions:  Precautions Precautions: Posterior Hip, Fall Precaution Comments: provided precaution handouts Required Braces or Orthoses: Other Brace Other Brace: right prosthesis Restrictions Weight Bearing Restrictions: Yes RLE Weight Bearing: Non weight bearing LLE Weight Bearing: Weight bearing as tolerated Other Position/Activity Restrictions: Posterior hip precautions Pain: Pain Assessment Pain Score: 0-No pain ADL: ADL Grooming: Supervision/safety, Minimal cueing Where Assessed-Grooming: Sitting at sink Upper Body Bathing: Moderate cueing, Supervision/safety Where Assessed-Upper Body Bathing: Sitting at sink Lower Body Bathing: Moderate cueing, Maximal assistance Where Assessed-Lower Body Bathing: Bed level Upper Body Dressing: Minimal cueing, Minimal assistance Where Assessed-Upper Body Dressing: Sitting at sink Lower Body Dressing: Maximal assistance Where Assessed-Lower Body Dressing: Bed level ADL Comments: pt has improved participation with his wife present.   Therapy/Group: Individual Therapy  Adaja Wander A Giannina Bartolome 06/18/2021, 12:19 PM

## 2021-06-17 NOTE — Progress Notes (Signed)
PROGRESS NOTE   Subjective/Complaints: Wife helping him with sliding board transfer He expresses no complaints this morning Wife would like to him to socialize more outside of his family, discussed chaplain consult  ROS: Limited due to cognitive/behavioral    Objective:   No results found. Recent Labs    06/16/21 1324  WBC 9.0  HGB 8.3*  HCT 25.7*  PLT 298   Recent Labs    06/16/21 1328  NA 131*  K 4.8  CL 92*  CO2 25  GLUCOSE 135*  BUN 76*  CREATININE 7.37*  CALCIUM 8.5*    Intake/Output Summary (Last 24 hours) at 06/17/2021 1355 Last data filed at 06/17/2021 1306 Gross per 24 hour  Intake 660 ml  Output 1496 ml  Net -836 ml        Physical Exam: Vital Signs Blood pressure 138/70, pulse 93, temperature 98 F (36.7 C), resp. rate 18, height 5\' 9"  (1.753 m), weight 65.3 kg, SpO2 100 %.   General: No acute distress Mood and affect are appropriate Heart: Tachycardic Lungs: Clear to auscultation, breathing unlabored, no rales or wheezes Abdomen: Positive bowel sounds, soft nontender to palpation, nondistended Extremities: Right upper extremity with significant swelling- stable from prior Skin: No evidence of breakdown, no evidence of rash   Skin:right bka well healed. Left hip wound cdi with immediate post-op dressing in place. Redness buttocks/perineal areas Neuro:  slow to process. Fair insight and awareness. Told me his wife built his prosthesis. Makes good eye contact.  Musculoskeletal: right BKA well shaped.     Assessment/Plan: 1. Functional deficits which require 3+ hours per day of interdisciplinary therapy in a comprehensive inpatient rehab setting. Physiatrist is providing close team supervision and 24 hour management of active medical problems listed below. Physiatrist and rehab team continue to assess barriers to discharge/monitor patient progress toward functional and medical  goals  Care Tool:  Bathing  Bathing activity did not occur: Refused Body parts bathed by patient: Right arm, Left arm, Chest, Abdomen, Front perineal area, Face   Body parts bathed by helper: Buttocks, Right upper leg, Left upper leg, Right lower leg, Left lower leg     Bathing assist Assist Level: Moderate Assistance - Patient 50 - 74%     Upper Body Dressing/Undressing Upper body dressing Upper body dressing/undressing activity did not occur (including orthotics): Refused What is the patient wearing?: Pull over shirt    Upper body assist Assist Level: Minimal Assistance - Patient > 75%    Lower Body Dressing/Undressing Lower body dressing    Lower body dressing activity did not occur: Refused What is the patient wearing?: Pants     Lower body assist Assist for lower body dressing: Maximal Assistance - Patient 25 - 49%     Toileting Toileting Toileting Activity did not occur (Clothing management and hygiene only): N/A (no void or bm)  Toileting assist Assist for toileting: Total Assistance - Patient < 25%     Transfers Chair/bed transfer  Transfers assist  Chair/bed transfer activity did not occur: Refused  Chair/bed transfer assist level: 2 Helpers (MIN A +2)     Locomotion Ambulation   Ambulation assist   Ambulation activity  did not occur: Refused  Assist level: Maximal Assistance - Patient 25 - 49% Assistive device: Parallel bars Max distance: 3'   Walk 10 feet activity   Assist  Walk 10 feet activity did not occur: Refused        Walk 50 feet activity   Assist Walk 50 feet with 2 turns activity did not occur: Refused         Walk 150 feet activity   Assist Walk 150 feet activity did not occur: Refused         Walk 10 feet on uneven surface  activity   Assist Walk 10 feet on uneven surfaces activity did not occur: Refused         Wheelchair     Assist Is the patient using a wheelchair?: Yes Type of Wheelchair:  Manual Wheelchair activity did not occur: Refused  Wheelchair assist level: Supervision/Verbal cueing, Set up assist      Wheelchair 50 feet with 2 turns activity    Assist    Wheelchair 50 feet with 2 turns activity did not occur: Refused   Assist Level: Supervision/Verbal cueing   Wheelchair 150 feet activity     Assist  Wheelchair 150 feet activity did not occur: Refused   Assist Level: Supervision/Verbal cueing   Blood pressure 138/70, pulse 93, temperature 98 F (36.7 C), resp. rate 18, height 5\' 9"  (1.753 m), weight 65.3 kg, SpO2 100 %.  Medical Problem List and Plan: 1.  L hemiarthroplasty of L hip  secondary to L hip fracture, hx of right BKA- pt states he does not use prosthesis             -patient may  shower               -ELOS/Goals: mod to min A 18-22 days  Continue CIR PT and OT  2.  Antithrombotics: -DVT/anticoagulation:  Pharmaceutical: Other (comment)--Eliquis bid             -antiplatelet therapy: Resume ASA.  3. Postoperative pain:  decrease Hydrocodone to 1 tab daily prn but not able to express needs/wants             --scheduled a dose of hydrocodone every morning and monitor for tolerance.  4. Mood: LCSW to follow for evaluation and support.              -antipsychotic agents: N/a 5. Neuropsych: This patient is not capable of making decisions on his own behalf.  -monitor cognition/affect. ?baseline 6. Skin/Wound Care:   Cefadroxil 500 mg/every 36 hour for wound prophylaxis- completed.               --Nystatin cream and powder to perineum and penile meatus.              --Gerhardt's butt cream to sacrum  7. Fluids/Electrolytes/Nutrition: strict I/O. Add 1200 cc FR.  --Discontinue ensure as nephro also on board.  8. T2DM: CBGs 81-181: continue to Monitor BS ac/hs and use SSI for elevated BS. 9. ESRD: Schedule HD at the end of the day MWF to  help with tolerance of therapy.              --Strict I/O. Daily wts. Continue Renvela for metabolic  bone disease 10. CAD/ICM: Monitor for symptoms with increase in activity. No meds to avoid hypotension w/HD.              --Continue  Lipitor.  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  12. Acute on chronic anemia: Monitor H/H with HD labs- reviewed and stable. Now on Aranesp weekly  13. Diarrhea: Felt to be due to antibiotics.  --continue fibercon for bulking.  14. History of right BKA: examined and moving well 15. Food pocketing?- needs D2 diet per wife.   16.  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 Left upper extremity swelling: Requested RN to ice and elevate and to get new ID bandage 19. Insomnia: continue scheduled melatonin 3mg  HS, ordered therapeutic grounds pass, and requested therapy to take patient out during the day when possible. 20. Flat affect, decreased socialization outside of family: chaplain and rec therapy consulted.   LOS: 7 days A FACE TO FACE EVALUATION WAS PERFORMED  Martha Clan P Rayelle Armor 06/17/2021, 1:55 PM

## 2021-06-17 NOTE — Progress Notes (Signed)
Occupational Therapy Weekly Progress Note  Patient Details  Name: Jonathan STUCKEY Sr. MRN: [id number] Date of Birth: 1951-07-03  Beginning of progress report period: June 11, 2021 End of progress report period: June 17, 2021     Patient has met 3 of 3 short term goals. He has started to participate more and engage with therapy to reach his goals to getting home.  He is now using slide board to transfer with min -mod A. With prosthesis, can rise to stand with mod A.  Can now tolerate standing for up to 1 minute.  Patient continues to demonstrate the following deficits:   Pt continues to have muscle weakness, decreased initiation, decreased awareness, decreased problem solving, decreased memory, and delayed processing, and decreased standing balance and decreased balance strategies, difficulty maintaining precautions and pain which impacts his level of independence. and therefore will continue to benefit from skilled OT intervention to enhance overall performance with BADL.  Patient progressing toward long term goals..  Continue plan of care.  OT Short Term Goals Week 1:  OT Short Term Goal 1 (Week 1): Pt will actively participate in therapy sessions at least 70% of the time. OT Short Term Goal 1 - Progress (Week 1): Met OT Short Term Goal 2 (Week 1): Pt will demonstrate initiation by washing his face and UB with min A. OT Short Term Goal 2 - Progress (Week 1): Met OT Short Term Goal 3 (Week 1): Pt will demonstrate attention to task to self feed with min A. OT Short Term Goal 3 - Progress (Week 1): Met Week 2:  OT Short Term Goal 1 (Week 2): STGs = LTGs      Therapy Documentation Precautions:  Precautions Precautions: Posterior Hip, Fall Precaution Comments: provided precaution handouts Required Braces or Orthoses: Other Brace Other Brace: right prosthesis Restrictions Weight Bearing Restrictions: Yes RLE Weight Bearing: Non weight bearing LLE Weight Bearing: Weight bearing  as tolerated Other Position/Activity Restrictions: Posterior hip precautions      ADL: ADL Grooming: Supervision/safety, Minimal cueing Where Assessed-Grooming: Sitting at sink Upper Body Bathing: Moderate cueing, Supervision/safety Where Assessed-Upper Body Bathing: Sitting at sink Lower Body Bathing: Moderate cueing, Maximal assistance Where Assessed-Lower Body Bathing: Bed level Upper Body Dressing: Minimal cueing, Minimal assistance Where Assessed-Upper Body Dressing: Sitting at sink Lower Body Dressing: Maximal assistance Where Assessed-Lower Body Dressing: Bed level ADL Comments: pt has improved participation with his wife present.    [place] 06/17/2021, 2:17 PM

## 2021-06-17 NOTE — Progress Notes (Signed)
Occupational Therapy Session Note  Patient Details  Name: Jonathan FOLKS Sr. MRN: [id number] Date of Birth: 26-Feb-1951  Today's Date: 06/17/2021 OT Individual Time: 0370-9643 OT Individual Time Calculation (min): 30 min    Short Term Goals: Week 1:  OT Short Term Goal 1 (Week 1): Pt will actively participate in therapy sessions at least 70% of the time. OT Short Term Goal 2 (Week 1): Pt will demonstrate initiation by washing his face and UB with min A. OT Short Term Goal 3 (Week 1): Pt will demonstrate attention to task to self feed with min A.  Skilled Therapeutic Interventions/Progress Updates:    Pt seen this session with +2 A to facilitate sit to stands. Pt received in room, taken to main gym. Pt worked on sit to stands 3x at high low table and 2x in parallel bars.  He was able to rise to stand with a heavy mod A with cues to push up from wc to table vs pulling up on table.  He tolerated each stand for 45 sec and on the last stand with // bars stood for 60 seconds.  He did not want to engage in any activities in standing (like checkers) but did respond well to cues for upright posture  Pt returned to room with all needs met. Spouse present. Belt alarm on.   Therapy Documentation Precautions:  Precautions Precautions: Posterior Hip, Fall Precaution Comments: provided precaution handouts Required Braces or Orthoses: Other Brace Other Brace: right prosthesis Restrictions Weight Bearing Restrictions: Yes RLE Weight Bearing: Non weight bearing LLE Weight Bearing: Weight bearing as tolerated Other Position/Activity Restrictions: Posterior hip precautions   Pain: c/o L hip pain when standing - not rated.   ADL: ADL Grooming: Supervision/safety, Minimal cueing Where Assessed-Grooming: Sitting at sink Upper Body Bathing: Moderate cueing, Supervision/safety Where Assessed-Upper Body Bathing: Sitting at sink Lower Body Bathing: Moderate cueing, Maximal assistance Where  Assessed-Lower Body Bathing: Bed level Upper Body Dressing: Minimal cueing, Minimal assistance Where Assessed-Upper Body Dressing: Sitting at sink Lower Body Dressing: Maximal assistance Where Assessed-Lower Body Dressing: Bed level ADL Comments: pt has improved participation with his wife present.  Therapy/Group: Individual Therapy  [place] 06/17/2021, 1:00 PM

## 2021-06-17 NOTE — Progress Notes (Signed)
Physical Therapy Session Note  Patient Details  Name: Jonathan [name]. MRN: [id number] Date of Birth: 12/19/1950  Today's Date: 06/17/2021 PT Individual Time: 0920-1015; 6387-5643 PT Individual Time Calculation (min): 55 min and 68 min  Today's Date: 06/17/2021 PT Missed Time: 15 Minutes Missed Time Reason: Increased agitation;Patient unwilling to participate;Patient fatigue  Short Term Goals: Week 1:  PT Short Term Goal 1 (Week 1): STG = LTG due to ELOS  Skilled Therapeutic Interventions/Progress Updates:  Session 1 Pt received seated in WC in room w/wife shaving his head, pt denied pain initially and was agreeable to PT. Emphasis of session on BLE strength, standing tolerance and gait training. Pt transported from room to main gym w/total A for time management.   In // bars, performed 5 sit <>stands w/BUE support on bars. Initially required max A x2, progressed to mod A x1. Provided mirror in front of pt for visual biofeedback on body position and encouragement to maintain cervical extension and reduce kyphosis. Pt able to ambulate 3' x2 in bars w/max A x2, mod verbal cues for head position, trunk extension and sequencing. Pt fatigued very quickly and became very agitated w/therapist, transported pt to day room to attempt to improve mood and minimize distractions.   Pt performed 90s on Kinetron prior to yelling at therapist that he was "done" and refused to continue to participate. Provided max encouragement to participate and offered multiple UE exercises, pt refused. Transported pt back to room w/total A and was left seated in WC in room, ice pack on L hip, all needs in reach. Missed 15 minutes of skilled PT due to pt agitation and refusal.    Session 2  Pt received seated in WC in room, wife present. Pt unresponsive to pain questions but provided no indication of pain in L hip. Emphasis of session on BLE strength and improved activity tolerance. Lengthy discussion w/pt and wife  regarding current WC and recommendations for a tilt in space chair to facilitate upright posture, as pt remains slumped forward in current manual chair. Pt and wife in agreement with therapist and WC eval scheduled w/[facility]. Pt requested to go outside and was transported to reflection fountain w/total A for time management. Pt refused all therapeutic interventions and did not seem to understand that he was outside, perseverating on "I want to go outside" despite informing him that he was already. Max verbal cues to encourage pt to perform therex w/therapist, pt adamantly refused.   Pt transported back to room w/total A and therapist educated wife on several exercises for BLE strength and improved posture. Pt's wife then performed the following exercises w/pt, as she is able to cue/motivate pt to participate effectively:  -Seated marches, 2x10 per side w/3s isometric and eccentric contraction  -LAQ, 2x10 per side (AAROM on L) w/3s isometric and eccentric contraction  -Bilateral shoulder flexion w/5s isometric hold  -Arm circles forward and backward, 2x15s each direction  -WC pushup attempts x3  -Posterior shoulder capsule stretch, 2x30s per side   Pt began to verbalize pain in L hip, nursing notified to administer pain meds. Pt was left seated in WC in room, wife present, ice pack on L hip and all needs in reach.   Therapy Documentation Precautions:  Precautions Precautions: Posterior Hip, Fall Precaution Comments: provided precaution handouts Required Braces or Orthoses: Other Brace Other Brace: right prosthesis Restrictions Weight Bearing Restrictions: Yes RLE Weight Bearing: Non weight bearing LLE Weight Bearing: Weight bearing as tolerated Other Position/Activity Restrictions: Posterior  hip precautions   Therapy/Group: Individual Therapy [name] Charlynn Salih, PT, DPT  06/17/2021, 7:41 AM

## 2021-06-18 LAB — GLUCOSE, CAPILLARY
Glucose-Capillary: 63 mg/dL — ABNORMAL LOW (ref 70–99)
Glucose-Capillary: 76 mg/dL (ref 70–99)
Glucose-Capillary: 121 mg/dL — ABNORMAL HIGH (ref 70–99)
Glucose-Capillary: 193 mg/dL — ABNORMAL HIGH (ref 70–99)

## 2021-06-18 NOTE — Progress Notes (Signed)
Occupational Therapy Session Note  Patient Details  Name: Jonathan [name]. MRN: [id number] Date of Birth: 1951-05-08  Today's Date: 06/19/2021 OT Individual Time: 7829-5621 OT Individual Time Calculation (min): 54 min   Skilled Therapeutic Interventions/Progress Updates:    Pt greeted in bed. Wife present. Therapeutic focus was placed on [facility] transfers today, as wife reports that pt does not use a toilet at home, just Depends. Discussed concept of timed toileting and advised this for home. Pt first donned pants bedlevel with Mod A, assistance needed for threading Lt LE and also Mod A to pull pants over hips. Pt more participative with cues from wife. Mod A for supine<sit where pt then donned his Rt prosthetic with min cues and significantly increased time. +2 Min A for slideboard<drop arm BSC. Pt became grouchy with staff at times, reluctantly participating due to presence of wife but participating nonetheless. Worked on lateral lean and small squat-stand skills in context of removing LB clothing, completing pericare, and donning clean clothing afterwards. Pt had incontinent BM in brief but was able to have a very small BM in the [facility]! We celebrated! Pt able to lean laterally with Mod A of 1, Min-Mod A for squat-stands to pull clean brief + pants up over hips. Min A of 2 for squat pivot<bed afterwards where pt then scooted up towards [place] with Min-Mod A, increased time, and encouragement. Per wife, pt used squat pivot transfer technique at home PTA. After he removed his prosthetic, pt returned to bed. We used the call bell together to ask if he could have some pain medicine from RN. His Lt hip was hurting. OT provided him with an ice pack for the Lt LE. He remained in bed at close of session, smiled x1 while we celebrated his accomplishment of OOB toileting! Wife also appearing very happy. All needs within reach and bed alarm set at time of departure. Left in care of RN to receive his pain medicine.    Therapy Documentation Precautions:  Precautions Precautions: Posterior Hip, Fall Precaution Comments: provided precaution handouts Required Braces or Orthoses: Other Brace Other Brace: right prosthesis Restrictions Weight Bearing Restrictions: Yes RLE Weight Bearing: Non weight bearing LLE Weight Bearing: Weight bearing as tolerated Other Position/Activity Restrictions: Posterior hip precautions  Vital Signs: Therapy Vitals Temp: 98.3 F (36.8 C) Pulse Rate: 92 Resp: 18 BP: 125/66 Patient Position (if appropriate): Lying Oxygen Therapy SpO2: 99 % O2 Device: Room Air ADL: ADL Grooming: Supervision/safety, Minimal cueing Where Assessed-Grooming: Sitting at sink Upper Body Bathing: Moderate cueing, Supervision/safety Where Assessed-Upper Body Bathing: Sitting at sink Lower Body Bathing: Moderate cueing, Maximal assistance Where Assessed-Lower Body Bathing: Bed level Upper Body Dressing: Minimal cueing, Minimal assistance Where Assessed-Upper Body Dressing: Sitting at sink Lower Body Dressing: Maximal assistance Where Assessed-Lower Body Dressing: Bed level ADL Comments: pt has improved participation with his wife present.   Therapy/Group: Individual Therapy  Nikeya Maxim A Karyl Sharrar 06/19/2021, 4:14 PM

## 2021-06-18 NOTE — Progress Notes (Signed)
Occupational Therapy Session Note  Patient Details  Name: Jonathan [name]. MRN: [id number] Date of Birth: [birth year]  Today's Date: 06/18/2021 OT Individual Time: 0930-1030 OT Individual Time Calculation (min): 60 min    Short Term Goals: Week 2:  OT Short Term Goal 1 (Week 2): STGs = LTGs  Skilled Therapeutic Interventions/Progress Updates:    Pt received in bed with wife present.  Brought drop arm BSC to EOB,  had pt sit to EOB with cues and CGA.  Worked on him transferring using a sq pivot to his R to [facility] with mod A and then back to bed to L with min A.  He did not need to toilet and has been incontinent at home, but he needs to practice these transfers to hopefully work on becoming continent.   Pt then did a sq pivot to wc to L with only min A.   Pt taken to gym to work on sit to stands to [employer].  Used a long mirror in front of pt for feedback.  Pt kept trying to pull up on walker. Used numerous repetitions with tactile cues, verbal cues and demonstration for pt to practice pushing up with B hands from wc to lift his hips.  Pt is NOT an eager learner as he often does not want to do the activities but with encouragement, gentle cues, and pacing he will participate.   He was able to push up 5x in a row.  Then cued him to push up, then transfer R Hand to walker then L hand then push up full to stand.  After much cajoling, pt FINALLY tried this and he rose to stand with MIN A.   Pt taken back to his room to demonstrate this to his wife, but could not replicate this as he kept saying "no" and would not follow cues.    Will try again in another session. Pt resting in wc with wife in room and all needs met.   Therapy Documentation Precautions:  Precautions Precautions: Posterior Hip, Fall Precaution Comments: provided precaution handouts Required Braces or Orthoses: Other Brace Other Brace: right prosthesis Restrictions Weight Bearing Restrictions: Yes RLE Weight Bearing: Non weight  bearing LLE Weight Bearing: Weight bearing as tolerated Other Position/Activity Restrictions: Posterior hip precautions    Vital Signs: Therapy Vitals Temp: 98.6 F (37 C) Temp Source: Oral Pulse Rate: 94 Resp: 16 BP: 137/70 Patient Position (if appropriate): Lying Oxygen Therapy SpO2: 97 % O2 Device: Room Air Pain: Pain Assessment Pain Scale: 0-10 Pain Score: 0-No pain ADL: ADL Grooming: Supervision/safety, Minimal cueing Where Assessed-Grooming: Sitting at sink Upper Body Bathing: Moderate cueing, Supervision/safety Where Assessed-Upper Body Bathing: Sitting at sink Lower Body Bathing: Moderate cueing, Maximal assistance Where Assessed-Lower Body Bathing: Bed level Upper Body Dressing: Minimal cueing, Minimal assistance Where Assessed-Upper Body Dressing: Sitting at sink Lower Body Dressing: Maximal assistance Where Assessed-Lower Body Dressing: Bed level ADL Comments: pt has improved participation with his wife present.   Therapy/Group: Individual Therapy  [place] 06/18/2021, 8:25 AM

## 2021-06-18 NOTE — Progress Notes (Signed)
[facility] Progress Note   Subjective:   Seen in room, seated on bedside with PT. Wife present. Wife and pt deny any new concerns.   Objective Vitals:   06/17/21 1315 06/17/21 1806 06/18/21 0500 06/18/21 0521  BP: 138/70 (!) 141/71  137/70  Pulse: 93 93  94  Resp: 18 18  16   Temp: 98 F (36.7 C) 97.6 F (36.4 C)  98.6 F (37 C)  TempSrc: Oral Oral  Oral  SpO2: 100% 99%  97%  Weight:   67.7 kg   Height:       Physical Exam General: elderly male, alert and in NAD Heart: RRR, no murmur Lungs: CTA bilaterally, respirations unlabored Abdomen: Soft, non-distended, +BS Extremities: R BKA. No edema b/l lower extremities Dialysis Access:  RUE AVF   Additional Objective Labs: Basic Metabolic Panel: Recent Labs  Lab 06/14/21 0338 06/16/21 1328  NA 130* 131*  K 5.8* 4.8  CL 94* 92*  CO2 24 25  GLUCOSE 95 135*  BUN 98* 76*  CREATININE 8.98* 7.37*  CALCIUM 8.6* 8.5*  PHOS 4.0 3.7   Liver Function Tests: Recent Labs  Lab 06/14/21 0338 06/16/21 1328  ALBUMIN 2.4* 2.3*   No results for input(s): LIPASE, AMYLASE in the last 168 hours. CBC: Recent Labs  Lab 06/14/21 0338 06/16/21 1324  WBC 9.5 9.0  HGB 9.3* 8.3*  HCT 28.2* 25.7*  MCV 91.9 95.2  PLT 304 298   Blood Culture    Component Value Date/Time   SDES BLOOD SITE NOT SPECIFIED 10/30/2020 1307   SPECREQUEST  10/30/2020 1307    BOTTLES DRAWN AEROBIC ONLY Blood Culture results may not be optimal due to an inadequate volume of blood received in culture bottles   CULT  10/30/2020 1307    NO GROWTH 5 DAYS Performed at [facility], [place] [address] [place], [place] [address]    REPTSTATUS 11/04/2020 FINAL 10/30/2020 1307    Cardiac Enzymes: No results for input(s): CKTOTAL, CKMB, CKMBINDEX, TROPONINI in the last 168 hours. CBG: Recent Labs  Lab 06/17/21 1140 06/17/21 1627 06/17/21 2058 06/18/21 0523 06/18/21 0547  GLUCAP 159* 147* 156* 63* 76   Iron Studies: No results for  input(s): IRON, TIBC, TRANSFERRIN, FERRITIN in the last 72 hours. @lablastinr3 @ Studies/Results: No results found. Medications:   allopurinol  100 mg Oral QHS   apixaban  5 mg Oral BID   aspirin  81 mg Oral Daily   atorvastatin  40 mg Oral QHS   Chlorhexidine Gluconate Cloth  6 each Topical Q0600   darbepoetin (ARANESP) injection - DIALYSIS  100 mcg Intravenous Q Mon-HD   feeding supplement (NEPRO CARB STEADY)  237 mL Oral QHS   folic acid  1 mg Oral Daily   Gerhardt's butt cream   Topical QID   HYDROcodone-acetaminophen  1 tablet Oral Q breakfast   insulin aspart  0-9 Units Subcutaneous TID AC & HS   melatonin  5 mg Oral QHS   multivitamin  1 tablet Oral QHS   nystatin   Topical TID   nystatin cream   Topical BID   polycarbophil  1,250 mg Oral Daily   predniSONE  5 mg Oral Q breakfast   sevelamer carbonate  800 mg Oral TID WC    Dialysis Orders: Center: [facility]  on MWF . 180NRe, 4 hour 83min, BFR 400, DFR Auto 1.5, EDW 61.7kg, 2K, 2Ca, AVF 15g Heparin 3000 unit bolus  Calcitriol 1.16mcg PO q HD    Assessment/Plan:  Left femoral neck fracture: s/p fixation per orthopedic surgery 10/28. Now in CIR.  ESRD:  HD MWF. Next HD today Right upper arm/AV fistula swelling: IR consulted to evaluate.  Went for F'gram 11/1 which was unrevealing and no intervention. Increased swelling, removed ID band 06/14/2021. Looks better today. No issues with cannulation. +T/B Hypertension/volume: No evidence of volume excess by exam. BP controlled.UF as tolerated.  Anemia: S/P 2 unit of PRBCs 06/07/2021.  1 unit on 11/2.  Give Aranesp with HD 11/7. Hgb 9.3 06/14/2021, rechecking with HD today.  Metabolic bone disease: Ca+/Po4 at goal. continue calcitriol and binders.  Nutrition: ALB 2.4 protein supplement to diet, renal/carb modified CAD/a.fib/hx of LV thrombus: cardiology consulted  cleared for hip surgery . 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

## 2021-06-18 NOTE — Progress Notes (Addendum)
Physical Therapy Weekly Progress Note  Patient Details  Name: Jonathan [name]. MRN: [id number] Date of Birth: April 26, 1951  Beginning of progress report period: June 11, 2021 End of progress report period: June 18, 2021  Today's Date: 06/18/2021 PT Individual Time: 4098-1191 PT Individual Time Calculation (min): 55 min   Patient has met 3 of 5 long term goals. Pt's estimated LOS was extended due to improved participation and functional decline. Pt is able to perform lateral scoot transfers w/min A and slide board transfers w/mod A from bed <>chair and chair <>car. Pt also able to perform bed mobility w/max verbal cues and min A. Pt continues to progress self-propulsion in WC. Added sit <>stand goal per wife's request. Pt's wife is very involved in pt's therapy and engages in family education daily.   Patient continues to demonstrate the following deficits muscle weakness, unbalanced muscle activation, decreased coordination, and decreased motor planning, decreased initiation, decreased attention, decreased awareness, decreased problem solving, decreased safety awareness, decreased memory, and delayed processing, and decreased standing balance, decreased postural control, decreased balance strategies, and difficulty maintaining precautions and therefore will continue to benefit from skilled PT intervention to increase functional independence with mobility.  Patient progressing toward long term goals..  Continue plan of care.  PT Short Term Goals Week 1:  PT Short Term Goal 1 (Week 1): STG = LTG due to ELOS PT Short Term Goal 1 - Progress (Week 1): Progressing toward goal Week 2:  PT Short Term Goal 1 (Week 2): STG = LTG due to LOS  Skilled Therapeutic Interventions/Progress Updates:  Pt received seated in WC in room, wife present. Pt denied pain and required heavy encouragement to participate in PT. Pt transported to 5N hallway to simulate being outside in hopes to improve pt's mood and  help readjust circadian rhythm. Attempted sit <>stands x5 w/heavy multimodal cues for pt to push up from [facility], pt refused to participate and became very agitated w/therapist. Max encouragement and education from therapist to inform pt why pushing up from [facility] was important, pt repeatedly pointed finger in therapist's face and stated "NO!". Therapist recruited wife for session and pt became more agreeable. Sit <>stand x3 w/max A x2, pt will only pull up on RW to stand, requiring max A for stabilization and trunk support. Pt able to stand x35mn w/max A x2 for trunk extension, manual cervical extension, and tactile cues for glute and paraspinal activation to maintain upright posture.   [facility] and 46' w/max A x2 and RW. Max verbal cues from wife for pt to participate, maintain forward gaze and progress AD. Max tactile cues from therapist for lateral weight shift, glute activation and trunk extension. Pt demonstrated narrow BOS, decreased step length/clearance, maintained knee flexion through stance phase, and scissoring gait. Pt's wife sang to pt throughout to maintain engagement.   Pt transported back to room w/total A and performed lateral scoot transfer from WC to EOB on R side w/min A. Pt performed sit <> supine w/min A and was left supine in bed w/wife, all needs in reach.   Therapy Documentation Precautions:  Precautions Precautions: Posterior Hip, Fall Precaution Comments: provided precaution handouts Required Braces or Orthoses: Other Brace Other Brace: right prosthesis Restrictions Weight Bearing Restrictions: Yes RLE Weight Bearing: Non weight bearing LLE Weight Bearing: Weight bearing as tolerated Other Position/Activity Restrictions: Posterior hip precautions   Therapy/Group: Individual Therapy  JCruzita LedererPlaster, PT, DPT 06/18/2021, 7:17 AM

## 2021-06-18 NOTE — Progress Notes (Signed)
   06/18/21 1430  Clinical Encounter Type  Visited With Patient and family together  Visit Type Initial  Referral From Nurse  Consult/Referral To Chaplain   Chaplain Jorene Guest responded to the consult request for prayer. The patient's wife was at his bedside. Chaplain prayed and Mrs, expressed appreciation for the visit. This note was prepared by Jeanine Luz, M.Div..  For questions please contact by phone [phone].

## 2021-06-18 NOTE — Progress Notes (Signed)
PROGRESS NOTE   Subjective/Complaints: Appreciate chaplain visit Patient and wife have no concerns this morning Dialysis today Swelling in right upper extremity is much improved  ROS: Limited due to cognitive/behavioral; right upper extremity swelling improved   Objective:   No results found. Recent Labs    06/16/21 1324  WBC 9.0  HGB 8.3*  HCT 25.7*  PLT 298   Recent Labs    06/16/21 1328  NA 131*  K 4.8  CL 92*  CO2 25  GLUCOSE 135*  BUN 76*  CREATININE 7.37*  CALCIUM 8.5*    Intake/Output Summary (Last 24 hours) at 06/18/2021 1752 Last data filed at 06/18/2021 1335 Gross per 24 hour  Intake 540 ml  Output --  Net 540 ml        Physical Exam: Vital Signs Blood pressure 125/66, pulse 84, temperature 97.8 F (36.6 C), resp. rate 20, height 5\' 9"  (1.753 m), weight 68.5 kg, SpO2 100 %.   General: No acute distress Mood and affect are appropriate Heart: Tachycardic Lungs: Clear to auscultation, breathing unlabored, no rales or wheezes Abdomen: Positive bowel sounds, soft nontender to palpation, nondistended Extremities: Right upper extremity swelling improved Skin: No evidence of breakdown, no evidence of rash   Skin:right bka well healed. Left hip wound cdi with immediate post-op dressing in place. Redness buttocks/perineal areas Neuro:  slow to process. Fair insight and awareness. Told me his wife built his prosthesis. Makes good eye contact.  Musculoskeletal: right BKA well shaped.     Assessment/Plan: 1. Functional deficits which require 3+ hours per day of interdisciplinary therapy in a comprehensive inpatient rehab setting. Physiatrist is providing close team supervision and 24 hour management of active medical problems listed below. Physiatrist and rehab team continue to assess barriers to discharge/monitor patient progress toward functional and medical goals  Care Tool:  Bathing   Bathing activity did not occur: Refused Body parts bathed by patient: Right arm, Left arm, Chest, Abdomen, Face   Body parts bathed by helper: Front perineal area, Buttocks, Right upper leg, Left upper leg, Right lower leg, Left lower leg     Bathing assist Assist Level: Moderate Assistance - Patient 50 - 74%     Upper Body Dressing/Undressing Upper body dressing Upper body dressing/undressing activity did not occur (including orthotics): Refused What is the patient wearing?: Pull over shirt    Upper body assist Assist Level: Minimal Assistance - Patient > 75%    Lower Body Dressing/Undressing Lower body dressing    Lower body dressing activity did not occur: Refused What is the patient wearing?: Pants, Incontinence brief     Lower body assist Assist for lower body dressing: Total Assistance - Patient < 25%     Toileting Toileting Toileting Activity did not occur Landscape architect and hygiene only): N/A (no void or bm)  Toileting assist Assist for toileting: Dependent - Patient 0%     Transfers Chair/bed transfer  Transfers assist  Chair/bed transfer activity did not occur: Refused  Chair/bed transfer assist level: Moderate Assistance - Patient 50 - 74% (Lateral scoot)     Locomotion Ambulation   Ambulation assist   Ambulation activity did not occur: Refused  Assist level: 2 helpers Assistive device: Walker-rolling Max distance: 46'   Walk 10 feet activity   Assist  Walk 10 feet activity did not occur: Refused  Assist level: 2 helpers Assistive device: Walker-rolling   Walk 50 feet activity   Assist Walk 50 feet with 2 turns activity did not occur: Refused         Walk 150 feet activity   Assist Walk 150 feet activity did not occur: Refused         Walk 10 feet on uneven surface  activity   Assist Walk 10 feet on uneven surfaces activity did not occur: Refused         Wheelchair     Assist Is the patient using a  wheelchair?: Yes Type of Wheelchair: Manual Wheelchair activity did not occur: Refused  Wheelchair assist level: Supervision/Verbal cueing, Set up assist      Wheelchair 50 feet with 2 turns activity    Assist    Wheelchair 50 feet with 2 turns activity did not occur: Refused   Assist Level: Supervision/Verbal cueing   Wheelchair 150 feet activity     Assist  Wheelchair 150 feet activity did not occur: Refused   Assist Level: Supervision/Verbal cueing   Blood pressure 125/66, pulse 84, temperature 97.8 F (36.6 C), resp. rate 20, height 5\' 9"  (1.753 m), weight 68.5 kg, SpO2 100 %.  Medical Problem List and Plan: 1.  L hemiarthroplasty of L hip  secondary to L hip fracture, hx of right BKA- pt states he does not use prosthesis             -patient may  shower               -ELOS/Goals: mod to min A 18-22 days  Continue CIR PT and OT  2.  Antithrombotics: -DVT/anticoagulation:  Pharmaceutical: Other (comment)--Eliquis bid             -antiplatelet therapy: Resume ASA.  3. Postoperative pain:  d/c prn hydrocodone.              --scheduled a dose of hydrocodone every morning and monitor for tolerance.  4. Mood: LCSW to follow for evaluation and support.              -antipsychotic agents: N/a 5. Neuropsych: This patient is not capable of making decisions on his own behalf.  -monitor cognition/affect. ?baseline 6. Skin/Wound Care:   Cefadroxil 500 mg/every 36 hour for wound prophylaxis- completed.               --Nystatin cream and powder to perineum and penile meatus.              --Gerhardt's butt cream to sacrum  7. Fluids/Electrolytes/Nutrition: strict I/O. Add 1200 cc FR.  --Discontinue ensure as nephro also on board.  8. T2DM: CBGs 81-181: continue to Monitor BS ac/hs and use SSI for elevated BS. 9. ESRD: Schedule HD at the end of the day MWF to  help with tolerance of therapy.              --Strict I/O. Daily wts. Continue Renvela for metabolic bone  disease 10. CAD/ICM: Monitor for symptoms with increase in activity. No meds to avoid hypotension w/HD.              --Continue Lipitor.  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  12. Acute on chronic anemia: Monitor H/H with HD labs- reviewed and stable. Now on Aranesp  weekly  13. Diarrhea: Felt to be due to antibiotics.  --continue fibercon for bulking.  14. History of right BKA: examined and moving well 15. Food pocketing?- needs D2 diet per wife.   16.  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 Left upper extremity swelling: Requested RN to ice and elevate and to get new ID bandage 19. Insomnia: continue scheduled melatonin 3mg  HS, ordered therapeutic grounds pass, and requested therapy to take patient out during the day when possible. 20. Flat affect, decreased socialization outside of family: chaplain and rec therapy consulted.   LOS: 8 days A FACE TO FACE EVALUATION WAS PERFORMED  Jonathan [name] 06/18/2021, 5:52 PM

## 2021-06-18 NOTE — Progress Notes (Signed)
Hypoglycemic Event  CBG: 63  Treatment: 8 oz juice/soda  Symptoms: None  Follow-up CBG: [id number] CBG Result:76  Possible Reasons for Event: Unknown  Comments/MD notified:    Beverley Fiedler

## 2021-06-18 NOTE — Plan of Care (Signed)
  Problem: Sit to Stand Goal: LTG:  Patient will perform sit to stand with assistance level (PT) Description: LTG:  Patient will perform sit to stand with assistance level (PT) Flowsheets (Taken 06/18/2021 1548) LTG: PT will perform sit to stand in preparation for functional mobility with assistance level: (LRAD) Maximal Assistance - Patient 25 - 49%

## 2021-06-19 LAB — GLUCOSE, CAPILLARY
Glucose-Capillary: 73 mg/dL (ref 70–99)
Glucose-Capillary: 135 mg/dL — ABNORMAL HIGH (ref 70–99)
Glucose-Capillary: 149 mg/dL — ABNORMAL HIGH (ref 70–99)
Glucose-Capillary: 117 mg/dL — ABNORMAL HIGH (ref 70–99)

## 2021-06-19 NOTE — Progress Notes (Signed)
[facility] Progress Note   Subjective:   Pt seen in room, reports feeling well with no concerns. Feels RUE swelling is improved. Denies SOB, CP, palpitations, dizziness, abdominal pain and nausea.   Objective Vitals:   06/18/21 1807 06/18/21 1846 06/18/21 1936 06/19/21 0454  BP: 137/74 133/67 (!) 145/78 134/65  Pulse: 87 87 83 (!) 101  Resp:  18 18 16   Temp: 98.1 F (36.7 C) 98.1 F (36.7 C) 98 F (36.7 C) 99.4 F (37.4 C)  TempSrc:   Oral   SpO2:  99% 99% 97%  Weight: 67 kg   68 kg  Height:       Physical Exam General: elderly male, alert and in NAD Heart: RRR, no murmur Lungs: CTA bilaterally, respirations unlabored Abdomen: Soft, non-distended, +BS Extremities: R BKA. No edema b/l lower extremities Dialysis Access:  RUE AVF, moderate RUE non-pitting edema, no erythema/warmth  Additional Objective Labs: Basic Metabolic Panel: Recent Labs  Lab 06/14/21 0338 06/16/21 1328  NA 130* 131*  K 5.8* 4.8  CL 94* 92*  CO2 24 25  GLUCOSE 95 135*  BUN 98* 76*  CREATININE 8.98* 7.37*  CALCIUM 8.6* 8.5*  PHOS 4.0 3.7   Liver Function Tests: Recent Labs  Lab 06/14/21 0338 06/16/21 1328  ALBUMIN 2.4* 2.3*   No results for input(s): LIPASE, AMYLASE in the last 168 hours. CBC: Recent Labs  Lab 06/14/21 0338 06/16/21 1324  WBC 9.5 9.0  HGB 9.3* 8.3*  HCT 28.2* 25.7*  MCV 91.9 95.2  PLT 304 298   Blood Culture    Component Value Date/Time   SDES BLOOD SITE NOT SPECIFIED 10/30/2020 1307   SPECREQUEST  10/30/2020 1307    BOTTLES DRAWN AEROBIC ONLY Blood Culture results may not be optimal due to an inadequate volume of blood received in culture bottles   CULT  10/30/2020 1307    NO GROWTH 5 DAYS Performed at [facility], [place] [address], [place], [place] [address]    REPTSTATUS 11/04/2020 FINAL 10/30/2020 1307    Cardiac Enzymes: No results for input(s): CKTOTAL, CKMB, CKMBINDEX, TROPONINI in the last 168 hours. CBG: Recent Labs   Lab 06/18/21 0523 06/18/21 0547 06/18/21 1212 06/18/21 2101 06/19/21 0615  GLUCAP 63* 76 121* 193* 73   Iron Studies: No results for input(s): IRON, TIBC, TRANSFERRIN, FERRITIN in the last 72 hours. @lablastinr3 @ Studies/Results: No results found. Medications:   allopurinol  100 mg Oral QHS   apixaban  5 mg Oral BID   aspirin  81 mg Oral Daily   atorvastatin  40 mg Oral QHS   Chlorhexidine Gluconate Cloth  6 each Topical Q0600   darbepoetin (ARANESP) injection - DIALYSIS  100 mcg Intravenous Q Mon-HD   feeding supplement (NEPRO CARB STEADY)  237 mL Oral QHS   folic acid  1 mg Oral Daily   Gerhardt's butt cream   Topical QID   HYDROcodone-acetaminophen  1 tablet Oral Q breakfast   insulin aspart  0-9 Units Subcutaneous TID AC & HS   melatonin  5 mg Oral QHS   multivitamin  1 tablet Oral QHS   nystatin   Topical TID   nystatin cream   Topical BID   polycarbophil  1,250 mg Oral Daily   predniSONE  5 mg Oral Q breakfast   sevelamer carbonate  800 mg Oral TID WC    Dialysis Orders: Center: [facility]  on MWF . 180NRe, 4 hour 47min, BFR 400, DFR Auto 1.5, EDW 61.7kg, 2K,  2Ca, AVF 15g Heparin 3000 unit bolus  Calcitriol 1.64mcg PO q HD  Assessment/Plan: Left femoral neck fracture: s/p fixation per orthopedic surgery 10/28. Now in CIR.  ESRD:  HD MWF. Next HD Monday Right upper arm/AV fistula swelling: IR consulted to evaluate.  Went for F'gram 11/1 which was unrevealing and no intervention. Increased swelling, removed ID band 06/14/2021. Continues to improve. No issues with cannulation. +T/B. Keep elevated as able.  Hypertension/volume: No evidence of volume excess by exam. BP controlled. Toleated 1.5L UF yestrday.  Anemia: S/P 2 unit of PRBCs 06/07/2021.  1 unit on 11/2.  Give Aranesp with HD 11/7. Hgb 9.3 06/14/2021, recheck with next HD.  Metabolic bone disease: Ca+/Po4 at goal. continue calcitriol and binders. Recheck labs with next HD.  Nutrition: ALB 2.4 protein supplement to  diet, renal/carb modified CAD/a.fib/hx of LV thrombus: cardiology consulted  cleared for hip surgery . 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

## 2021-06-20 LAB — GLUCOSE, CAPILLARY
Glucose-Capillary: 61 mg/dL — ABNORMAL LOW (ref 70–99)
Glucose-Capillary: 64 mg/dL — ABNORMAL LOW (ref 70–99)
Glucose-Capillary: 78 mg/dL (ref 70–99)
Glucose-Capillary: 138 mg/dL — ABNORMAL HIGH (ref 70–99)
Glucose-Capillary: 173 mg/dL — ABNORMAL HIGH (ref 70–99)
Glucose-Capillary: 158 mg/dL — ABNORMAL HIGH (ref 70–99)

## 2021-06-20 MED ORDER — CHLORHEXIDINE GLUCONATE CLOTH 2 % EX PADS
6.0000 | MEDICATED_PAD | Freq: Every day | CUTANEOUS | Status: DC
  Administered 2021-06-20 – 2021-06-21 (×2): 6 via TOPICAL

## 2021-06-20 NOTE — Progress Notes (Signed)
Physical Therapy Session Note  Patient Details  Name: Jonathan LUCCHESI Sr. MRN: [id number] Date of Birth: 1950-11-23  Today's Date: 06/20/2021 PT Individual Time: 1345-1414 PT Individual Time Calculation (min): 29 min   Short Term Goals: Week 1:  PT Short Term Goal 1 (Week 1): STG = LTG due to ELOS PT Short Term Goal 1 - Progress (Week 1): Progressing toward goal  Skilled Therapeutic Interventions/Progress Updates: Pt presents sitting in w/c and grudgingly participates w/ therapy w/ encouragement from PT and spouse.  Pt requires max A for sit to stand x 1 trial, and secind trial mod A w/ verbal and visual cues for correct hand placement.  Pt amb slowly 40' x 2 w/ mod A and RW including turns to return to seat.  Pt amb w/ flexed posture and corrects for 3-5 seconds before resuming flexed posture.  Pt requires verbal cues for increased step length, and for maintaining THR precautions during turns.  Pt unable to state THR precautions but spouse 2/3 correct.  Pt attempts to sit before completing turn and approach to seat, requiring max A for safe transition.  Pt remained in w/c w/ chair alarm on and all needs in reach, spouse present.     Therapy Documentation Precautions:  Precautions Precautions: Posterior Hip, Fall Precaution Comments: provided precaution handouts Required Braces or Orthoses: Other Brace Other Brace: right prosthesis Restrictions Weight Bearing Restrictions: Yes RLE Weight Bearing: Non weight bearing LLE Weight Bearing: Weight bearing as tolerated Other Position/Activity Restrictions: Posterior hip precautions General:   Vital Signs: Therapy Vitals Temp: 97.8 F (36.6 C) Pulse Rate: 97 Resp: 19 BP: 139/74 Patient Position (if appropriate): Sitting Oxygen Therapy SpO2: 97 % O2 Device: Room Air Pain:10/10 w/ gait, but states no pain at initiation of session.       Therapy/Group: Individual Therapy  Ladoris Gene 06/20/2021, 2:18 PM

## 2021-06-20 NOTE — Progress Notes (Signed)
PROGRESS NOTE   Subjective/Complaints: Pt In bed scooping down breakfast. No complaints  ROS: Limited due to cognitive/behavioral    Objective:   No results found. No results for input(s): WBC, HGB, HCT, PLT in the last 72 hours.  No results for input(s): NA, K, CL, CO2, GLUCOSE, BUN, CREATININE, CALCIUM in the last 72 hours.   Intake/Output Summary (Last 24 hours) at 06/20/2021 1009 Last data filed at 06/19/2021 1852 Gross per 24 hour  Intake 360 ml  Output --  Net 360 ml        Physical Exam: Vital Signs Blood pressure (!) 145/72, pulse 96, temperature 98.2 F (36.8 C), temperature source Oral, resp. rate 18, height 5\' 9"  (1.753 m), weight 70.2 kg, SpO2 98 %.   Constitutional: No distress . Vital signs reviewed. HEENT: NCAT, EOMI, oral membranes moist Neck: supple Cardiovascular: RRR without murmur. No JVD    Respiratory/Chest: CTA Bilaterally without wheezes or rales. Normal effort    GI/Abdomen: BS +, non-tender, non-distended Ext: no clubbing, cyanosis, trace LLE edema Psych: passive. Doesn't engage  Skin: No evidence of breakdown, no evidence of rash   Skin:right bka well healed. Left hip wound cdi. Redness buttocks/perineal areas Neuro:  slow to process. Fair insight and awareness. Told me his wife built his prosthesis. Makes good eye contact.  Musculoskeletal: right BKA well shaped.     Assessment/Plan: 1. Functional deficits which require 3+ hours per day of interdisciplinary therapy in a comprehensive inpatient rehab setting. Physiatrist is providing close team supervision and 24 hour management of active medical problems listed below. Physiatrist and rehab team continue to assess barriers to discharge/monitor patient progress toward functional and medical goals  Care Tool:  Bathing  Bathing activity did not occur: Refused Body parts bathed by patient: Right arm, Left arm, Chest, Abdomen, Face    Body parts bathed by helper: Front perineal area, Buttocks, Right upper leg, Left upper leg, Right lower leg, Left lower leg     Bathing assist Assist Level: Moderate Assistance - Patient 50 - 74%     Upper Body Dressing/Undressing Upper body dressing Upper body dressing/undressing activity did not occur (including orthotics): Refused What is the patient wearing?: Pull over shirt    Upper body assist Assist Level: Minimal Assistance - Patient > 75%    Lower Body Dressing/Undressing Lower body dressing    Lower body dressing activity did not occur: Refused What is the patient wearing?: Pants, Incontinence brief     Lower body assist Assist for lower body dressing: Total Assistance - Patient < 25%     Toileting Toileting Toileting Activity did not occur Landscape architect and hygiene only): N/A (no void or bm)  Toileting assist Assist for toileting: 2 Helpers     Transfers Chair/bed transfer  Transfers assist  Chair/bed transfer activity did not occur: Refused  Chair/bed transfer assist level: Moderate Assistance - Patient 50 - 74% (Lateral scoot)     Locomotion Ambulation   Ambulation assist   Ambulation activity did not occur: Refused  Assist level: 2 helpers Assistive device: Walker-rolling Max distance: 46'   Walk 10 feet activity   Assist  Walk 10 feet activity did not  occur: Refused  Assist level: 2 helpers Assistive device: Walker-rolling   Walk 50 feet activity   Assist Walk 50 feet with 2 turns activity did not occur: Refused         Walk 150 feet activity   Assist Walk 150 feet activity did not occur: Refused         Walk 10 feet on uneven surface  activity   Assist Walk 10 feet on uneven surfaces activity did not occur: Refused         Wheelchair     Assist Is the patient using a wheelchair?: Yes Type of Wheelchair: Manual Wheelchair activity did not occur: Refused  Wheelchair assist level:  Supervision/Verbal cueing, Set up assist      Wheelchair 50 feet with 2 turns activity    Assist    Wheelchair 50 feet with 2 turns activity did not occur: Refused   Assist Level: Supervision/Verbal cueing   Wheelchair 150 feet activity     Assist  Wheelchair 150 feet activity did not occur: Refused   Assist Level: Supervision/Verbal cueing   Blood pressure (!) 145/72, pulse 96, temperature 98.2 F (36.8 C), temperature source Oral, resp. rate 18, height 5\' 9"  (1.753 m), weight 70.2 kg, SpO2 98 %.  Medical Problem List and Plan: 1.  L hemiarthroplasty of L hip  secondary to L hip fracture, hx of right BKA- pt states he does not use prosthesis             -patient may  shower               -ELOS/Goals: mod to min A 18-22 days  -Continue CIR therapies including PT, OT   2.  Antithrombotics: -DVT/anticoagulation:  Pharmaceutical: Other (comment)--Eliquis bid             -antiplatelet therapy: Resume ASA.  3. Postoperative pain:  d/c prn hydrocodone.              --scheduled a dose of hydrocodone every morning and monitor for tolerance.  4. Mood: LCSW to follow for evaluation and support.              -antipsychotic agents: N/a 5. Neuropsych: This patient is not capable of making decisions on his own behalf.  -monitor cognition/affect. ?baseline 6. Skin/Wound Care:   Cefadroxil 500 mg/every 36 hour for wound prophylaxis- completed.               --Nystatin cream and powder to perineum and penile meatus.              --Gerhardt's butt cream to sacrum  7. Fluids/Electrolytes/Nutrition: strict I/O. Add 1200 cc FR.  --Discontinued ensure as nephro also on board.  8. T2DM: CBGs 81-181: continue to Monitor BS ac/hs and use SSI for elevated BS. 9. ESRD: Schedule HD at the end of the day MWF to  help with tolerance of therapy.              --Strict I/O. Daily wts. Continue Renvela for metabolic bone disease 10. CAD/ICM: Monitor for symptoms with increase in activity. No meds to  avoid hypotension w/HD.              --Continue Lipitor.  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  12. Acute on chronic anemia: Monitor H/H with HD labs- reviewed and stable. Now on Aranesp weekly  13. Diarrhea: Felt to be due to antibiotics.  --continue fibercon for bulking.  14. History of right  BKA: examined and moving well 15. Food pocketing?- needs D2 diet per wife.   16.  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 Left upper extremity swelling: Requested RN to ice and elevate and to get new ID bandage--appears improved 19. Insomnia: continue scheduled melatonin 3mg  HS, ordered therapeutic grounds pass, and requested therapy to take patient out during the day when possible. 20. Flat affect, decreased socialization outside of family: chaplain and rec therapy consulted.   LOS: 10 days A FACE TO FACE EVALUATION WAS PERFORMED  Meredith Staggers 06/20/2021, 10:09 AM

## 2021-06-20 NOTE — Progress Notes (Signed)
Hypoglycemic Event  CBG:  64  Treatment: 4 oz juice/soda  Symptoms: None  Follow-up CBG: [id number] CBG Result:78  Possible Reasons for Event: Inadequate meal intake  Comments/MD notified: n/a      Jonathan [name] A

## 2021-06-20 NOTE — Progress Notes (Signed)
Occupational Therapy Session Note  Patient Details  Name: Jonathan TALERICO Sr. MRN: [id number] Date of Birth: 1950/12/15  Today's Date: 06/21/2021 OT Individual Time: 3500-9381 OT Individual Time Calculation (min): 69 min    Short Term Goals: Week 1:  OT Short Term Goal 1 (Week 1): Pt will actively participate in therapy sessions at least 70% of the time. OT Short Term Goal 1 - Progress (Week 1): Met OT Short Term Goal 2 (Week 1): Pt will demonstrate initiation by washing his face and UB with min Jonathan. OT Short Term Goal 2 - Progress (Week 1): Met OT Short Term Goal 3 (Week 1): Pt will demonstrate attention to task to self feed with min Jonathan. OT Short Term Goal 3 - Progress (Week 1): Met  Skilled Therapeutic Interventions/Progress Updates:    Pt greeted in the w/c wearing his prosthetic, wife Jonathan [name] present. Pt rolled his w/c to the sink with Mod Jonathan and then completed sit<stand with Min Jonathan of 2. Worked on standing balance during hygiene tasks as pt was incontinent of bowels in brief. Significant amount of time spent for hygiene due to pt having persistent small BMs. Per wife Jonathan [name], this was normal PTA. She has already informed the medical team. Pt required vcs to upright posture in stance, used mirror for visual feedback. Afterwards he was escorted via w/c to the hallway and then completed sit<stand with Mod Jonathan of 2 using RW. Pt ambulated in Jonathan straight path using RW with Min Jonathan of 1, wife in front of walker to provide encouragement/cues for upright posture. OT cued him to push through his hands more for more erect posture. He ambulated ~30 ft! We celebrated as this was Jonathan goal for wife. Pt then self propelled his w/c back to the room to work on UB strengthening. Continued UB strengthening while guiding him through UB exercises using 2# x10 reps, pt needing demonstrational cuing for carryover of proper technique. His affect visibly brightened when we played 80s music. Per wife, he really likes music.  Discussed benefits of ROM/strengthening for management of Rt UE edema. Elevated limb at close of session and provided ice pack for his Lt hip. Advised wife to remove ice pack after ~20 minutes. We used the call bell together to inform RN that he would like some pain medicine for his Lt hip. All needs within reach at time of departure. Excellent participation overall today!  Therapy Documentation Precautions:  Precautions Precautions: Posterior Hip, Fall Precaution Comments: provided precaution handouts Required Braces or Orthoses: Other Brace Other Brace: right prosthesis Restrictions Weight Bearing Restrictions: Yes RLE Weight Bearing: Non weight bearing LLE Weight Bearing: Weight bearing as tolerated Other Position/Activity Restrictions: Posterior hip precautions  ADL: ADL Eating: Supervision/safety Grooming: Supervision/safety, Minimal cueing Where Assessed-Grooming: Sitting at sink Upper Body Bathing: Supervision/safety, Minimal cueing Where Assessed-Upper Body Bathing: Sitting at sink Lower Body Bathing: Moderate assistance, Minimal cueing Where Assessed-Lower Body Bathing: Bed level Upper Body Dressing: Supervision/safety Where Assessed-Upper Body Dressing: Sitting at sink Lower Body Dressing: Maximal assistance Where Assessed-Lower Body Dressing: Bed level Toilet Transfer: Moderate assistance Toilet Transfer Method: Theatre manager: Bedside commode ADL Comments: pt has improved participation with his wife present.   Therapy/Group: Individual Therapy  Jonathan [name] Jonathan [name] 06/21/2021, 12:34 PM

## 2021-06-20 NOTE — Progress Notes (Signed)
[facility] Progress Note   Subjective:   Pt seen in room, sleeping, awakens easily to voice. Denies SOB, CP, and dizziness. No new concerns this AM.   Objective Vitals:   06/19/21 0454 06/19/21 1425 06/19/21 1951 06/20/21 0544  BP: 134/65 125/66 133/67 (!) 145/72  Pulse: (!) 101 92 95 96  Resp: 16 18 18 18   Temp: 99.4 F (37.4 C) 98.3 F (36.8 C) 98.3 F (36.8 C) 98.2 F (36.8 C)  TempSrc:    Oral  SpO2: 97% 99% 100% 98%  Weight: 68 kg   70.2 kg  Height:       Physical Exam General: WDWN male, alert and in NAD Heart: RRR, no murmur Lungs: CTA bilaterally without wheezing, rhonchi or rales Abdomen: Soft, non-tender, non-distended, +BS Extremities: Trace edema L ankle Dialysis Access: RUE AVF, mild non-pitting edema, + t/b  Additional Objective Labs: Basic Metabolic Panel: Recent Labs  Lab 06/14/21 0338 06/16/21 1328  NA 130* 131*  K 5.8* 4.8  CL 94* 92*  CO2 24 25  GLUCOSE 95 135*  BUN 98* 76*  CREATININE 8.98* 7.37*  CALCIUM 8.6* 8.5*  PHOS 4.0 3.7   Liver Function Tests: Recent Labs  Lab 06/14/21 0338 06/16/21 1328  ALBUMIN 2.4* 2.3*   No results for input(s): LIPASE, AMYLASE in the last 168 hours. CBC: Recent Labs  Lab 06/14/21 0338 06/16/21 1324  WBC 9.5 9.0  HGB 9.3* 8.3*  HCT 28.2* 25.7*  MCV 91.9 95.2  PLT 304 298   Blood Culture    Component Value Date/Time   SDES BLOOD SITE NOT SPECIFIED 10/30/2020 1307   SPECREQUEST  10/30/2020 1307    BOTTLES DRAWN AEROBIC ONLY Blood Culture results may not be optimal due to an inadequate volume of blood received in culture bottles   CULT  10/30/2020 1307    NO GROWTH 5 DAYS Performed at [facility], [place] [address] [place], [place] [address]    REPTSTATUS 11/04/2020 FINAL 10/30/2020 1307    Cardiac Enzymes: No results for input(s): CKTOTAL, CKMB, CKMBINDEX, TROPONINI in the last 168 hours. CBG: Recent Labs  Lab 06/19/21 0615 06/19/21 1133 06/19/21 1659  06/19/21 2124 06/20/21 0548  GLUCAP 73 135* 149* 117* 64*   Iron Studies: No results for input(s): IRON, TIBC, TRANSFERRIN, FERRITIN in the last 72 hours. @lablastinr3 @ Studies/Results: No results found. Medications:   allopurinol  100 mg Oral QHS   apixaban  5 mg Oral BID   aspirin  81 mg Oral Daily   atorvastatin  40 mg Oral QHS   Chlorhexidine Gluconate Cloth  6 each Topical Q0600   darbepoetin (ARANESP) injection - DIALYSIS  100 mcg Intravenous Q Mon-HD   feeding supplement (NEPRO CARB STEADY)  237 mL Oral QHS   folic acid  1 mg Oral Daily   Gerhardt's butt cream   Topical QID   HYDROcodone-acetaminophen  1 tablet Oral Q breakfast   insulin aspart  0-9 Units Subcutaneous TID AC & HS   melatonin  5 mg Oral QHS   multivitamin  1 tablet Oral QHS   nystatin   Topical TID   nystatin cream   Topical BID   polycarbophil  1,250 mg Oral Daily   predniSONE  5 mg Oral Q breakfast   sevelamer carbonate  800 mg Oral TID WC    Dialysis Orders: Center: [facility]  on MWF . 180NRe, 4 hour 67min, BFR 400, DFR Auto 1.5, EDW 61.7kg, 2K, 2Ca, AVF 15g Heparin 3000 unit  bolus  Calcitriol 1.79mcg PO q HD  Assessment/Plan: Left femoral neck fracture: s/p fixation per orthopedic surgery 10/28. Now in CIR.  ESRD:  HD MWF. Next HD Monday Right upper arm/AV fistula swelling: IR consulted to evaluate.  Went for F'gram 11/1 which was unrevealing and no intervention. Increased swelling, removed ID band 06/14/2021. Continues to improve. No issues with cannulation. +T/B. Keep elevated as able.  Hypertension/volume: BP controlled. Tolerated 1.5L UF last HD, will increase goal slightly as he does have trace edema. ? If bed weights are accurate, will need to reeval EDW before discharge.  Anemia: S/P 2 unit of PRBCs 06/07/2021.  1 unit on 11/2.  Give Aranesp with HD 11/7. Hgb 9.3 06/14/2021, recheck with next HD.  Metabolic bone disease: Ca+/Po4 at goal. continue calcitriol and binders. Recheck labs with next HD.   Nutrition: ALB 2.4 protein supplement to diet, renal/carb modified CAD/a.fib/hx of LV thrombus: cardiology consulted  cleared for hip surgery . 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

## 2021-06-21 DIAGNOSIS — R531 Weakness: Secondary | ICD-10-CM

## 2021-06-21 DIAGNOSIS — Z7189 Other specified counseling: Secondary | ICD-10-CM

## 2021-06-21 DIAGNOSIS — Z515 Encounter for palliative care: Secondary | ICD-10-CM

## 2021-06-21 LAB — GLUCOSE, CAPILLARY
Glucose-Capillary: 80 mg/dL (ref 70–99)
Glucose-Capillary: 120 mg/dL — ABNORMAL HIGH (ref 70–99)
Glucose-Capillary: 94 mg/dL (ref 70–99)

## 2021-06-21 MED ORDER — DARBEPOETIN ALFA 150 MCG/0.3ML IJ SOSY
150.0000 ug | PREFILLED_SYRINGE | INTRAMUSCULAR | Status: DC
  Administered 2021-06-21: 150 ug via INTRAVENOUS
  Filled 2021-06-21: qty 0.3

## 2021-06-21 NOTE — Progress Notes (Signed)
Physical Therapy Session Note  Patient Details  Name: Jonathan DUFRESNE Sr. MRN: [id number] Date of Birth: [birth year]  Today's Date: 06/21/2021 PT Individual Time: 1105-1202 PT Individual Time Calculation (min): 57 min   Short Term Goals: Week 1:  PT Short Term Goal 1 (Week 1): STG = LTG due to ELOS PT Short Term Goal 1 - Progress (Week 1): Progressing toward goal Week 2:  PT Short Term Goal 1 (Week 2): STG = LTG due to LOS  Skilled Therapeutic Interventions/Progress Updates:  Pt received seated in WC in room, ice pack on L hip, wife present. Pt denied pain in hip and required max encouragement to participate in therapy. Pt self-propelled 150' from room to 5N hallway w/S*, max verbal cues throughout to encourage pt to continue to self-propel as he repeatedly stopped and stared at therapist in hallway. Noted poor propulsion efficiency, slow speed and difficulty navigating obstacles.   Gait training -Pt performed sit <>stands x2 w/max A from [facility] for trunk support and anterior weight shift to RW.  -Pt ambulated 22' and 20' w/RW and mod A. Max multimodal cues for upright posture, glute activation and lateral weight shift. Pt able to correct posture for 5-10 seconds prior to returning to previous impairments.   Pt began to verbalize pain during second gait trial, unable to assess where. Pt's wife reported high levels of pain in pt's anal and testes area due to consistent diarrhea and scrotal swelling. Encouraged pt to self-propel back to room using BUEs and BLEs for improved speed, pt refused. Pt's wife unable to convince pt to participate, so pt transported back to room w/total A for time management and performed lateral scoot transfer from [facility] to bed w/min A on R side. Sit <>supine w/min A for BLE management and max verbal cues for sequencing and hand placement. Pt rolled to L & R w/mod A for brief change w/total A. Pt was left supine in bed, nursing present to administer pain meds, all needs in reach.    Therapy Documentation Precautions:  Precautions Precautions: Posterior Hip, Fall Precaution Comments: provided precaution handouts Required Braces or Orthoses: Other Brace Other Brace: right prosthesis Restrictions Weight Bearing Restrictions: Yes RLE Weight Bearing: Non weight bearing LLE Weight Bearing: Weight bearing as tolerated Other Position/Activity Restrictions: Posterior hip precautions   Therapy/Group: Individual Therapy [name] [name], PT, DPT  06/21/2021, 7:47 AM

## 2021-06-21 NOTE — Progress Notes (Signed)
[facility] Progress Note   Subjective:   Patient seen and examined in room.  Sitting in wheelchair.  Wife at bedside.  Reports chronic diarrhea for last several months.  Also concerned about swelling in RUE.    Objective Vitals:   06/20/21 1313 06/20/21 1928 06/21/21 0500 06/21/21 0522  BP: 139/74 138/68  (!) 143/74  Pulse: 97 100  95  Resp: 19 18  18   Temp: 97.8 F (36.6 C) 97.8 F (36.6 C)  98.7 F (37.1 C)  TempSrc:  Oral    SpO2: 97% 93%  97%  Weight:   70.5 kg   Height:       Physical Exam General:WDWN male in NAD, sitting in wheelchair Heart:RRR, no mrg Lungs:CTAB, nml WOB on RA Abdomen:soft, NTND Extremities:no edema LLE, R BKA with prosthetic in place, 1+edema RUE Dialysis Access: RU AVF +b/t   Filed Weights   06/19/21 0454 06/20/21 0544 06/21/21 0500  Weight: 68 kg 70.2 kg 70.5 kg    Intake/Output Summary (Last 24 hours) at 06/21/2021 1211 Last data filed at 06/20/2021 1800 Gross per 24 hour  Intake 480 ml  Output --  Net 480 ml    Additional Objective Labs: Basic Metabolic Panel: Recent Labs  Lab 06/16/21 1328  NA 131*  K 4.8  CL 92*  CO2 25  GLUCOSE 135*  BUN 76*  CREATININE 7.37*  CALCIUM 8.5*  PHOS 3.7   Liver Function Tests: Recent Labs  Lab 06/16/21 1328  ALBUMIN 2.3*   CBC: Recent Labs  Lab 06/16/21 1324  WBC 9.0  HGB 8.3*  HCT 25.7*  MCV 95.2  PLT 298   CBG: Recent Labs  Lab 06/20/21 1203 06/20/21 1636 06/20/21 2115 06/21/21 0619 06/21/21 1203  GLUCAP 138* 173* 158* 80 120*   Medications:   allopurinol  100 mg Oral QHS   apixaban  5 mg Oral BID   aspirin  81 mg Oral Daily   Chlorhexidine Gluconate Cloth  6 each Topical Q0600   Chlorhexidine Gluconate Cloth  6 each Topical Q0600   darbepoetin (ARANESP) injection - DIALYSIS  100 mcg Intravenous Q Mon-HD   feeding supplement (NEPRO CARB STEADY)  237 mL Oral QHS   folic acid  1 mg Oral Daily   Gerhardt's butt cream   Topical QID    HYDROcodone-acetaminophen  1 tablet Oral Q breakfast   insulin aspart  0-9 Units Subcutaneous TID AC & HS   melatonin  5 mg Oral QHS   multivitamin  1 tablet Oral QHS   nystatin   Topical TID   nystatin cream   Topical BID   polycarbophil  1,250 mg Oral Daily   predniSONE  5 mg Oral Q breakfast   sevelamer carbonate  800 mg Oral TID WC    Dialysis Orders: Center: [place]  on MWF . 180NRe, 4 hour 6min, BFR 400, DFR Auto 1.5, EDW 61.7kg, 2K, 2Ca, AVF 15g Heparin 3000 unit bolus  Calcitriol 1.55mcg PO q HD  Assessment/Plan: 1. L femoral neck frx: s/p fixation per ortho on 10/28.  In CIR.  2. ESRD - on HD MWF.  HD today per regular schedule. 3. RUE/AVF swelling - IR consulted to eval.  Fistulogram on 11/1 which was unrevealing with no intervention needed.  Per notes now improving, wife reports it is about the same.  Advised to elevated.  Will try increased UF to see if responds. No issues with cannulation.  3. Anemia of CKD- Hgb 8.3. s/p 2 units pRBC  on 10/31, 1 unit on 11/2.  Increase aranesp to 118mcg qwks. 4. Secondary hyperparathyroidism - Ca and phos in goal.  Plan to hold Renvela a few days and see if diarrhea improves.  Does not typically cause diarrhea but has happened before.  5. HTN/volume - BP controlled.  Not close to EDW if bed weights are accurate.  Does not appear volume overloaded but ongoing edema in RUE, will increase UF goal and see if improvement noted.   6. Nutrition - Renal diet w/fluid restrictions.  7. Diarrhea - chronic issues for several months.  Will try holding Renvela a few days and see if improved.   8. CAD/A fib/Hx LV thrombus - cardio consulted and cleared for surgery.  9. 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 Dementia - per primary   Jen Mow, PA-C [facility] 06/21/2021,12:11 PM  LOS: 11 days

## 2021-06-21 NOTE — Progress Notes (Signed)
Occupational Therapy Session Note  Patient Details  Name: Jonathan SWOR Sr. MRN: [id number] Date of Birth: [birth year]  Today's Date: 06/21/2021 OT Individual Time: 9371-6967 OT Individual Time Calculation (min): 60 min    Short Term Goals: Week 2:  OT Short Term Goal 1 (Week 2): STGs = LTGs  Skilled Therapeutic Interventions/Progress Updates:    Pt received in bed sleeping but awoke fairly easily. Pt's wife not present. Even though she was not present, pt participated well and followed 1 step directions consistently.  He did not get irritated today.   Pt agreeable to bathing LB in bed.  He reached to wash his upper thighs, I assisted with LLE due to precautions.  Pt was incontinent of loose bowel which took considerable clean up. Pt rolled in bed without A following directions well when I asked him to shift his weight or move his legs.  Clean brief and pants donned. He then sat to EOB with min A using his arms to push up.  From sitting EOB, he donned R prosthesis with min A.  Placed board for pt to move to wc to his R.  Pt scooted on board with CGA. CGA just for extra safety in case pt should lean back and to stabilize the board.  Once in chair, pt able to adjust his hips with min cues.   Set up at sink and completed oral care independently and UB bathing min cues and dressing with setup.  His wife arrived at this point.  Updated her on his progress and to be aware of his loose stools - that he may need another brief change soon.    Pt resting in wc with spouse in the room.   Therapy Documentation Precautions:  Precautions Precautions: Posterior Hip, Fall Precaution Comments: provided precaution handouts Required Braces or Orthoses: Other Brace Other Brace: right prosthesis Restrictions Weight Bearing Restrictions: Yes RLE Weight Bearing: Non weight bearing LLE Weight Bearing: Weight bearing as tolerated Other Position/Activity Restrictions: Posterior hip precautions  Vital  Signs:  Pain: Pain Assessment Pain Score: 0-No pain ADL: ADL Eating: Supervision/safety Grooming: Supervision/safety, Minimal cueing Where Assessed-Grooming: Sitting at sink Upper Body Bathing: Supervision/safety, Minimal cueing Where Assessed-Upper Body Bathing: Sitting at sink Lower Body Bathing: Moderate assistance, Minimal cueing Where Assessed-Lower Body Bathing: Bed level Upper Body Dressing: Supervision/safety Where Assessed-Upper Body Dressing: Sitting at sink Lower Body Dressing: Maximal assistance Where Assessed-Lower Body Dressing: Bed level Toilet Transfer: Moderate assistance Toilet Transfer Method: Theatre manager: Bedside commode ADL Comments: pt has improved participation with his wife present.   Therapy/Group: Individual Therapy  [place] 06/21/2021, 10:07 AM

## 2021-06-21 NOTE — Consult Note (Signed)
Consultation Note Date: 06/21/2021   Patient Name: Jonathan [name]  DOB: 29-Jun-1951  MRN: [id number]  Age / Sex: 70 y.o., male  PCP: Jonathan [name]., MD Referring Physician: Charlett Blake, MD  Reason for Consultation: Establishing goals of care and Psychosocial/spiritual support  HPI/Patient Profile: 70 y.o. male  admitted on 06/10/2021 with  PMH of T2DM, CAD, ICM w/chronic systolic CHF, ESRD s/p failed renal transplant -HD MWF, recurrent LV thrombus, PAF, CVA, vascular dementia, R-BKA who was admitted on 06/03/21 after falling while attempting to use his prosthesis.   He had immediate onset of pain with difficulty walking due to left displaced femoral neck fracture/ underwent left hip hemi on 06/04/21.   Admitted to rehab on 06/10/2021 for inpatient therapies.  Anticipated discharge in the next few days.   Patient and family face treatment option decisions,  advance directive decisions and anticipatory care needs.    Clinical Assessment and Goals of Care:  This NP Jonathan [name] reviewed medical records, received report from team, assessed the patient and then meet at the patient's bedside along with his wife  to discuss diagnosis, prognosis, GOC, EOL wishes disposition and options.   Concept of Palliative Care was introduced as specialized medical care for people and their families living with serious illness.  If focuses on providing relief from the symptoms and stress of a serious illness.  The goal is to improve quality of life for both the patient and the family.   Values and goals of care important to patient and family were attempted to be elicited.  Created space and opportunity for patient  and family to explore thoughts and feelings regarding current medical situation.  Patient had minimal participation in today's conversation.  Patient's wife verbalizes frustration with the limited  transition of care options for her husband.  He is going to be requiring more nursing care needs as he transitions home. Wife also verbalizes concern over continued diarrhea, physical medicine attending addressing this issue.       A  discussion was had today regarding advanced directives.  Concepts specific to code status, artifical feeding and hydration, continued IV antibiotics and rehospitalization was had.  The difference between a aggressive medical intervention path  and a palliative comfort care path for this patient at this time was had.    MOST form introduced.  Hard Choices booklet left for review  Discussed with patient/wife  the importance of continued conversation with each other/family and the medical providers regarding overall plan of care and treatment options,  ensuring decisions are within the context of the patients values and GOCs.   Questions and concerns addressed.  Patient  encouraged to call with questions or concerns.     PMT will continue to support holistically.         No advance care planning documents noted, Jonathan [name] is next of kin and main decision maker in the event the patient cannot make his own healthcare decisions.   SUMMARY OF RECOMMENDATIONS    Code  Status/Advance Care Planning: Full code Educated patient/family to consider DNR/DNI status understanding evidenced based poor outcomes in similar hospitalized patient, as the cause of arrest is likely associated with advanced chronic illness rather than an easily reversible acute cardio-pulmonary event.    Palliative Prophylaxis:  Aspiration, Delirium Protocol, Frequent Pain Assessment, and Oral Care  Additional Recommendations (Limitations, Scope, Preferences): Full Scope Treatment  Psycho-social/Spiritual:  Desire for further Chaplaincy support:yes Additional Recommendations: Education on Hospice, recommendation for outpatient community-based rehab on discharge.  Prognosis:  Unable  to determine  Discharge Planning: To Be Determined      Primary Diagnoses: Present on Admission:  Vascular dementia without behavioral disturbance ([place])   I have reviewed the medical record, interviewed the patient and family, and examined the patient. The following aspects are pertinent.  Past Medical History:  Diagnosis Date   CAD (coronary artery disease)    CHF (congestive heart failure) ([name])    Diabetes mellitus without complication ([name])    type 2   DM (diabetes mellitus) ([name])    ESRD (end stage renal disease) ([place])    FUO (fever of unknown origin) 09/17/2019   Hyperlipidemia    Hypertension    Osteomyelitis ([place])    Peripheral arterial disease ([name])    PVD (peripheral vascular disease) ([place])    Renal disorder 2015   right kidney transplant   Stroke ([place])    Vascular dementia ([place])    Social History   Socioeconomic History   Marital status: Married    Spouse name: Jonathan [name]   Number of children: Not on file   Years of education: Not on file   Highest education level: Not on file  Occupational History   Not on file  Tobacco Use   Smoking status: Former   Smokeless tobacco: Never  Scientific laboratory technician Use: Never used  Substance and Sexual Activity   Alcohol use: Never   Drug use: Never   Sexual activity: Not on file  Other Topics Concern   Not on file  Social History Narrative   Lives with wife, son, daughter in Sports coach and grandson   Right Handed   Drinks 1 cup caffeine daily   Social Determinants of Health   Financial Resource Strain: Not on file  Food Insecurity: Not on file  Transportation Needs: Not on file  Physical Activity: Not on file  Stress: Not on file  Social Connections: Not on file   Family History  Problem Relation Age of Onset   Diabetes Mellitus II Mother    Cancer Mother        STOMACH   Diabetes Mellitus II Brother    Other Father        UNKOWN   Healthy Daughter    Diabetes Son    Scheduled Meds:  allopurinol  100 mg  Oral QHS   apixaban  5 mg Oral BID   aspirin  81 mg Oral Daily   Chlorhexidine Gluconate Cloth  6 each Topical Q0600   Chlorhexidine Gluconate Cloth  6 each Topical Q0600   darbepoetin (ARANESP) injection - DIALYSIS  150 mcg Intravenous Q Mon-HD   feeding supplement (NEPRO CARB STEADY)  237 mL Oral QHS   folic acid  1 mg Oral Daily   Gerhardt's butt cream   Topical QID   HYDROcodone-acetaminophen  1 tablet Oral Q breakfast   insulin aspart  0-9 Units Subcutaneous TID AC & HS   melatonin  5 mg Oral QHS   multivitamin  1 tablet  Oral QHS   nystatin   Topical TID   nystatin cream   Topical BID   polycarbophil  1,250 mg Oral Daily   predniSONE  5 mg Oral Q breakfast   Continuous Infusions: PRN Meds:.acetaminophen, alum & mag hydroxide-simeth, bisacodyl, diphenhydrAMINE, guaiFENesin-dextromethorphan, HYDROcodone-acetaminophen, polyethylene glycol, prochlorperazine **OR** prochlorperazine **OR** prochlorperazine Medications Prior to Admission:  Prior to Admission medications   Medication Sig Start Date End Date Taking? Authorizing Provider  allopurinol (ZYLOPRIM) 100 MG tablet Take 100 mg by mouth at bedtime. 06/09/14   [provider]  apixaban (ELIQUIS) 5 MG TABS tablet Take 5 mg by mouth 2 (two) times daily.    [provider]  aspirin 81 MG chewable tablet Chew 81 mg by mouth daily.  06/09/14   [provider]  atorvastatin (LIPITOR) 40 MG tablet Take 40 mg by mouth at bedtime.    [provider]  b complex-vitamin c-folic acid (NEPHRO-VITE) 0.8 MG TABS tablet Take 1 tablet by mouth daily. 05/08/20   [provider]  donepezil (ARICEPT) 10 MG tablet Take 1 tablet (10 mg total) by mouth at bedtime. Start half a tablet daily for 4 weeks to be increased to 1 tablet daily if tolerated 05/11/21   Garvin Fila, MD  feeding supplement (BOOST HIGH PROTEIN) LIQD Take 1 Container by mouth daily as needed (nutrition).    [provider]  folic acid  (FOLVITE) 1 MG tablet Take 1 tablet (1 mg total) by mouth daily. 05/11/21   Garvin Fila, MD  HYDROcodone-acetaminophen (NORCO/VICODIN) 5-325 MG tablet Take 1 tablet by mouth every 6 (six) hours as needed for moderate pain. 06/10/21   Dahal, Marlowe Aschoff, MD  insulin aspart (NOVOLOG) 100 UNIT/ML injection Inject 0-9 Units into the skin every 4 (four) hours. 06/10/21   Terrilee Croak, MD  Insulin Pen Needle 32G X 4 MM MISC 1 each by Other route as needed (insulin).  09/10/16   [provider]  levalbuterol Penne Lash HFA) 45 MCG/ACT inhaler Inhale 2 puffs into the lungs every 8 (eight) hours as needed for wheezing. 09/21/19   Raiford Noble Latif, DO  lidocaine-prilocaine (EMLA) cream Apply 1 application topically 3 (three) times a week. 30 minutes prior to Dialysis - MWF 05/08/18   [provider]  polyethylene glycol (MIRALAX / GLYCOLAX) 17 g packet Take 17 g by mouth daily as needed for mild constipation. 06/10/21   Terrilee Croak, MD  predniSONE (DELTASONE) 5 MG tablet Take 5 mg by mouth daily with breakfast. 03/02/20   [provider]  senna-docusate (SENOKOT-S) 8.6-50 MG tablet Take 1 tablet by mouth 2 (two) times daily. Hold if diarrhea Patient taking differently: Take 1 tablet by mouth 2 (two) times daily as needed for mild constipation. 11/06/20   Florencia Reasons, MD  sevelamer carbonate (RENVELA) 800 MG tablet Take 800 mg by mouth 3 (three) times daily with meals. 03/31/20   [provider]   No Known Allergies Review of Systems  Unable to perform ROS: Acuity of condition   Physical Exam Cardiovascular:     Rate and Rhythm: Normal rate.  Pulmonary:     Effort: Pulmonary effort is normal.  Neurological:     Mental Status: He is alert.    Vital Signs: BP 138/77   Pulse 94   Temp (!) 97.2 F (36.2 C)   Resp 18   Ht 5\' 9"  (1.753 m)   Wt 71.2 kg   SpO2 96%   BMI 23.18 kg/m  Pain Scale: 0-10  Pain Score: 0-No pain   SpO2: SpO2: 96 % O2 Device:SpO2: 96 % O2 Flow  Rate: .   IO: Intake/output summary:  Intake/Output Summary (Last 24 hours) at 06/21/2021 1619 Last data filed at 06/21/2021 1300 Gross per 24 hour  Intake 720 ml  Output --  Net 720 ml    LBM: Last BM Date: 06/20/21 Baseline Weight: Weight: 63 kg Most recent weight: Weight: 71.2 kg     Palliative Assessment/Data: 30 % at best    Time In:  1400 Time Out: 1510 Time Total: 70 minutes Greater than 50%  of this time was spent counseling and coordinating care related to the above assessment and plan.  Signed by: Jonathan Lessen, NP   Please contact Palliative Medicine Team phone at [phone] for questions and concerns.  For individual provider: See Shea Evans

## 2021-06-21 NOTE — Progress Notes (Signed)
Patient remains in dialysis. Dinner BS not obtained. Will endorse to [place]

## 2021-06-21 NOTE — Progress Notes (Signed)
Physical Therapy Discharge Summary  Patient Details  Name: Jonathan [name]. MRN: [id number] Date of Birth: 1950/10/20   Patient has met 5 of 5 long term goals due to improved activity tolerance, improved balance, improved postural control, increased strength, decreased pain, improved awareness, and improved coordination.  Patient to discharge at  a WC level S* and household ambulator  level Mod Assist.   Patient's care partner requires assistance to provide the necessary physical and cognitive assistance at discharge. Patient's wife has participated in daily therapy sessions and family training.   Recommendation:  Patient will benefit from ongoing skilled PT services in home health setting to continue to advance safe functional mobility, address ongoing impairments in global deconditioning, balance, BLE strength, safety awareness and minimize fall risk.  Equipment: WC eval completed for new tilt-in-space chair   Reasons for discharge: treatment goals met and discharge from hospital.   Patient/family agrees with progress made and goals achieved: Yes  PT Discharge Precautions/Restrictions Precautions Precautions: Posterior Hip;Fall Precaution Booklet Issued: Yes (comment) Precaution Comments: provided precaution handouts Required Braces or Orthoses: Other Brace Other Brace: right prosthesis Restrictions Weight Bearing Restrictions: Yes LLE Weight Bearing: Weight bearing as tolerated Other Position/Activity Restrictions: Posterior hip precautions Pain Interference Pain Interference Pain Effect on Sleep: 1. Rarely or not at all Pain Interference with Therapy Activities: 1. Rarely or not at all Pain Interference with Day-to-Day Activities: 1. Rarely or not at all Vision/Perception  Vision - History Ability to See in Adequate Light: 0 Adequate Perception Perception: Impaired Praxis Praxis: Impaired Praxis Impairment Details: Initiation;Motor planning  Cognition Overall  Cognitive Status: History of cognitive impairments - at baseline Arousal/Alertness: Lethargic Safety/Judgment: Impaired Comments: Difficult to communicate Sensation Sensation Light Touch: Appears Intact Hot/Cold: Appears Intact Proprioception: Appears Intact Stereognosis: Not tested Coordination Gross Motor Movements are Fluid and Coordinated: No Fine Motor Movements are Fluid and Coordinated: No Coordination and Movement Description: 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, R BKA, impaired motor planning Finger Nose Finger Test: NT Heel Shin Test: NT Motor  Motor Motor: Abnormal tone;Abnormal postural alignment and control Motor - Discharge Observations: Significant kyphotic posture at rest, muscle atrophy of BLEs, unable to correct posture longer than 3-5s  Mobility Bed Mobility Bed Mobility: Rolling Right;Rolling Left;Supine to Sit;Sit to Supine Rolling Right: Minimal Assistance - Patient > 75% Rolling Left: Minimal Assistance - Patient > 75% Transfers Transfers: Sit to Stand;Stand to Sit;Lateral/Scoot Transfers Sit to Stand: Moderate Assistance - Patient 50-74% Stand to Sit: Moderate Assistance - Patient 50-74% Lateral/Scoot Transfers: Manufacturing systems engineer Ambulation: Yes Gait Assistance: Moderate Assistance - Patient 50-74% Gait Distance (Feet): 55 Feet Assistive device: Rolling walker;Other (Comment) (R BKA prosthetic) Gait Assistance Details: Tactile cues for weight beaing;Manual facilitation for placement;Tactile cues for weight shifting;Verbal cues for gait pattern;Verbal cues for safe use of DME/AE;Tactile cues for posture;Manual facilitation for weight shifting;Manual facilitation for weight bearing Gait Gait: Yes Gait Pattern: Impaired Gait Pattern: Step-to pattern;Decreased step length - right;Decreased step length - left;Decreased stance time - left;Decreased weight shift to left;Left flexed knee in stance;Antalgic;Trunk flexed;Narrow base of support;Poor  foot clearance - left;Poor foot clearance - right Gait velocity: Decreased Stairs / Additional Locomotion Stairs: No Wheelchair Mobility Wheelchair Mobility: Yes Wheelchair Assistance: Chartered loss adjuster: Both upper extremities Wheelchair Parts Management: Supervision/cueing Distance: 150'  Trunk/Postural Assessment  Cervical Assessment Cervical Assessment: Exceptions to [facility] (Significant forward head) Thoracic Assessment Thoracic Assessment: Exceptions to WFL (Kyphosis, rounded shoulders) Lumbar Assessment Lumbar Assessment: Exceptions to [facility] (Posterior  pelvic tilt) Postural Control Postural Control: Deficits on evaluation Head Control: Unable to maintain cervical extension longer than 5s Trunk Control: Unable to maintain trunk extension without external support  Balance Balance Balance Assessed: Yes Static Sitting Balance Static Sitting - Balance Support: Bilateral upper extremity supported;Feet supported Static Sitting - Level of Assistance: 5: Stand by assistance Dynamic Sitting Balance Dynamic Sitting - Balance Support: Feet supported;During functional activity Dynamic Sitting - Level of Assistance: 4: Min assist Dynamic Sitting - Balance Activities: Forward lean/weight shifting;Lateral lean/weight shifting;Reaching for Consulting civil engineer Standing - Balance Support: Bilateral upper extremity supported;During functional activity Static Standing - Level of Assistance: 3: Mod assist Dynamic Standing Balance Dynamic Standing - Balance Support: Bilateral upper extremity supported;During functional activity Dynamic Standing - Level of Assistance: 3: Mod assist Dynamic Standing - Balance Activities: Lateral lean/weight shifting;Forward lean/weight shifting;Reaching for objects Extremity Assessment  RLE Assessment RLE Assessment: Exceptions to [facility] RLE Strength RLE Overall Strength: Due to impaired cognition;Deficits Right Hip  Flexion: 4-/5 Right Hip Extension: 4-/5 Right Hip ABduction: 4-/5 Right Hip ADduction: 4-/5 Right Knee Flexion: 4-/5 Right Knee Extension: 4-/5 LLE Assessment LLE Assessment: Exceptions to [facility] LLE Strength LLE Overall Strength: Due to precautions;Deficits;Due to pain Left Hip Flexion: 2/5 Left Hip Extension: 2+/5 Left Hip ABduction: 3/5 Left Hip ADduction: 3/5 Left Knee Flexion: 3/5 Left Knee Extension: 3/5 Left Ankle Dorsiflexion: 3/5 Left Ankle Plantar Flexion: 3/5   Cire Deyarmin E Enrica Corliss, PT, DPT 06/25/2021, 8:17 AM

## 2021-06-21 NOTE — Progress Notes (Signed)
PROGRESS NOTE   Subjective/Complaints:  Pt oriented to hospital Rehab but not time, wife at bedside, concerns about diarrhea x 3 mo which did not respond to fiber bulking  ROS: Limited due to cognitive/behavioral    Objective:   No results found. No results for input(s): WBC, HGB, HCT, PLT in the last 72 hours.  No results for input(s): NA, K, CL, CO2, GLUCOSE, BUN, CREATININE, CALCIUM in the last 72 hours.   Intake/Output Summary (Last 24 hours) at 06/21/2021 0915 Last data filed at 06/20/2021 1800 Gross per 24 hour  Intake 480 ml  Output --  Net 480 ml         Physical Exam: Vital Signs Blood pressure (!) 143/74, pulse 95, temperature 98.7 F (37.1 C), resp. rate 18, height 5\' 9"  (1.753 m), weight 70.5 kg, SpO2 97 %.    General: No acute distress Mood and affect are appropriate Heart: Regular rate and rhythm no rubs murmurs or extra sounds Lungs: Clear to auscultation, breathing unlabored, no rales or wheezes Abdomen: Positive bowel sounds, soft nontender to palpation, nondistended Extremities: No clubbing, cyanosis, or edema  Skin:right bka well healed. Left hip wound cdi. Redness buttocks/perineal areas Neuro:  slow to process. Fair insight and awareness. Told me his wife built his prosthesis. Makes good eye contact.  Musculoskeletal: right BKA well shaped.     Assessment/Plan: 1. Functional deficits which require 3+ hours per day of interdisciplinary therapy in a comprehensive inpatient rehab setting. Physiatrist is providing close team supervision and 24 hour management of active medical problems listed below. Physiatrist and rehab team continue to assess barriers to discharge/monitor patient progress toward functional and medical goals  Care Tool:  Bathing  Bathing activity did not occur: Refused Body parts bathed by patient: Right arm, Left arm, Chest, Abdomen, Face   Body parts bathed by helper:  Front perineal area, Buttocks, Right upper leg, Left upper leg, Right lower leg, Left lower leg     Bathing assist Assist Level: Moderate Assistance - Patient 50 - 74%     Upper Body Dressing/Undressing Upper body dressing Upper body dressing/undressing activity did not occur (including orthotics): Refused What is the patient wearing?: Pull over shirt    Upper body assist Assist Level: Minimal Assistance - Patient > 75%    Lower Body Dressing/Undressing Lower body dressing    Lower body dressing activity did not occur: Refused What is the patient wearing?: Pants, Incontinence brief     Lower body assist Assist for lower body dressing: Total Assistance - Patient < 25%     Toileting Toileting Toileting Activity did not occur Landscape architect and hygiene only): N/A (no void or bm)  Toileting assist Assist for toileting: 2 Helpers     Transfers Chair/bed transfer  Transfers assist  Chair/bed transfer activity did not occur: Refused  Chair/bed transfer assist level: Moderate Assistance - Patient 50 - 74% (Lateral scoot)     Locomotion Ambulation   Ambulation assist   Ambulation activity did not occur: Refused  Assist level: Moderate Assistance - Patient 50 - 74% Assistive device: Walker-rolling Max distance: 40'   Walk 10 feet activity   Assist  Walk 10 feet  activity did not occur: Refused  Assist level: Moderate Assistance - Patient - 50 - 74% Assistive device: Walker-rolling   Walk 50 feet activity   Assist Walk 50 feet with 2 turns activity did not occur: Refused         Walk 150 feet activity   Assist Walk 150 feet activity did not occur: Refused         Walk 10 feet on uneven surface  activity   Assist Walk 10 feet on uneven surfaces activity did not occur: Refused         Wheelchair     Assist Is the patient using a wheelchair?: Yes Type of Wheelchair: Manual Wheelchair activity did not occur: Refused  Wheelchair  assist level: Supervision/Verbal cueing, Set up assist      Wheelchair 50 feet with 2 turns activity    Assist    Wheelchair 50 feet with 2 turns activity did not occur: Refused   Assist Level: Supervision/Verbal cueing   Wheelchair 150 feet activity     Assist  Wheelchair 150 feet activity did not occur: Refused   Assist Level: Supervision/Verbal cueing   Blood pressure (!) 143/74, pulse 95, temperature 98.7 F (37.1 C), resp. rate 18, height 5\' 9"  (1.753 m), weight 70.5 kg, SpO2 97 %.  Medical Problem List and Plan: 1.  L hemiarthroplasty of L hip  secondary to L hip fracture, hx of right BKA- pt states he does not use prosthesis             -patient may  shower               -ELOS/Goals: mod to min A 18-22 days  -Continue CIR therapies including PT, OT   2.  Antithrombotics: -DVT/anticoagulation:  Pharmaceutical: Other (comment)--Eliquis bid             -antiplatelet therapy: Resume ASA.  3. Postoperative pain:  d/c prn hydrocodone.              --scheduled a dose of hydrocodone every morning and monitor for tolerance.  4. Mood: LCSW to follow for evaluation and support.              -antipsychotic agents: N/a 5. Neuropsych: This patient is not capable of making decisions on his own behalf.  -monitor cognition/affect. ?baseline 6. Skin/Wound Care:   Cefadroxil 500 mg/every 36 hour for wound prophylaxis- completed.               --Nystatin cream and powder to perineum and penile meatus.              --Gerhardt's butt cream to sacrum  7. Fluids/Electrolytes/Nutrition: strict I/O. Add 1200 cc FR.  --Discontinued ensure as nephro also on board.  8. T2DM: CBGs 81-181: continue to Monitor BS ac/hs and use SSI for elevated BS. CBG (last 3)  Recent Labs    06/20/21 1636 06/20/21 2115 06/21/21 0619  GLUCAP 173* 158* 80    9. ESRD: Schedule HD at the end of the day MWF to  help with tolerance of therapy.              --Strict I/O. Daily wts. Continue Renvela for  metabolic bone disease 10. CAD/ICM: Monitor for symptoms with increase in activity. No meds to avoid hypotension w/HD.              --Continue Lipitor.  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  12. Acute on chronic anemia: Monitor H/H  with HD labs- reviewed and stable. Now on Aranesp weekly  13. Diarrhea: Felt to be due to antibiotics. Wife states it has been ongoing x at least 43mo reviewed meds, poss meds include Renvela, as well as lipitor although pt has been on these for longer period of time Will hold atorvastatin for a couple days and see if this improves - discussed renvela with nephro  --continue fibercon for bulking.  14. History of right BKA: examined and moving well 15. Food pocketing?- needs D2 diet per wife.   16.  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 Left upper extremity swelling: Requested RN to ice and elevate and to get new ID bandage--appears improved 19. Insomnia: continue scheduled melatonin 3mg  HS, ordered therapeutic grounds pass, and requested therapy to take patient out during the day when possible. 20. Flat affect, decreased socialization outside of family: chaplain and rec therapy consulted.   LOS: 11 days A FACE TO FACE EVALUATION WAS PERFORMED  Charlett Blake 06/21/2021, 9:15 AM

## 2021-06-22 ENCOUNTER — Encounter: Payer: Medicare Other | Admitting: Physical Therapy

## 2021-06-22 ENCOUNTER — Inpatient Hospital Stay (HOSPITAL_COMMUNITY): Payer: Medicare Other

## 2021-06-22 LAB — GLUCOSE, CAPILLARY
Glucose-Capillary: 82 mg/dL (ref 70–99)
Glucose-Capillary: 138 mg/dL — ABNORMAL HIGH (ref 70–99)
Glucose-Capillary: 146 mg/dL — ABNORMAL HIGH (ref 70–99)
Glucose-Capillary: 140 mg/dL — ABNORMAL HIGH (ref 70–99)

## 2021-06-22 MED ORDER — CHLORHEXIDINE GLUCONATE CLOTH 2 % EX PADS
6.0000 | MEDICATED_PAD | Freq: Every day | CUTANEOUS | Status: DC
  Administered 2021-06-23 – 2021-06-24 (×2): 6 via TOPICAL

## 2021-06-22 MED ORDER — MILK AND MOLASSES ENEMA
1.0000 | Freq: Once | RECTAL | Status: AC
  Administered 2021-06-22: 240 mL via RECTAL
  Filled 2021-06-22: qty 240

## 2021-06-22 MED ORDER — LIDOCAINE HCL URETHRAL/MUCOSAL 2 % EX GEL
Freq: Once | CUTANEOUS | Status: AC
  Administered 2021-06-22: 6 via TOPICAL
  Filled 2021-06-22: qty 6

## 2021-06-22 NOTE — Patient Care Conference (Signed)
Inpatient RehabilitationTeam Conference and Plan of Care Update Date: 06/22/2021   Time: 11:59 AM    Patient Name: Jonathan SAHAGIAN Sr.      Medical Record Number: [id number]  Date of Birth: [birth year] Sex: Male         Room/Bed: 6R44R/1V40G-86 Payor Info: Payor: [place] / Plan: [employer] MEDICARE / Product Type: *No Product type* /    Admit Date/Time:  06/10/2021  4:33 PM  Primary Diagnosis:  Closed left subtrochanteric femur fracture, sequela  Hospital Problems: Principal Problem:   Closed left subtrochanteric femur fracture, sequela Active Problems:   ESRD (end stage renal disease) on dialysis [facility])   Vascular dementia without behavioral disturbance ([place])   Hx of right BKA [facility])    Expected Discharge Date: Expected Discharge Date: 06/23/21  Team Members Present: Physician leading conference: Dr. Alger Simons Social Worker Present: Ovidio Kin, LCSW Nurse Present: Dorien Chihuahua, RN PT Present: Francena Hanly, PT OT Present: Meriel Pica, OT PPS Coordinator present : Gunnar Fusi, SLP     Current Status/Progress Goal Weekly Team Focus  Bowel/Bladder     HD; M-W-F, Continent of bowel   Remain continent   Toileting  Swallow/Nutrition/ Hydration             ADL's   mod sit to stand, CGA with slide board bed >< wc or min/mod A with board or with sq pivot to drop arm BSC, mod bathing, set up UB dressing, max LB dressing  mod sit to stand, mod toilet transfer, max bathing  family ed, mobility and ADL training   Mobility   mod-max A sit <>stand, mod A gait up to 80' w/RW, S* WC  Mod A, max A sit <>stand  pt/fam edu, gait training, transfers   Communication             Safety/Cognition/ Behavioral Observations            Pain     Tylenol after therapy   Pain managed with prn medications   Monitor need for and effectiveness of medications  Skin     Edema right upper extremity    Edema decreasing   Monitor skin q shift; elevate affected extremity      Discharge Planning:  Wife has been present for therapies and pt has participated more and making progress. WC eval today and work on follow up. Will need HD prior to DC tomorrow.   Team Discussion: Patient on target for discharge. Attempted to provide recommendations to ease burden of care for wife; noted she reverted to prior methods, "that's how we do it".  Patient on target to meet rehab goals: Mod assist - Max assist for ambulation up to 150'.   *See Care Plan and progress notes for long and short-term goals.   Revisions to Treatment Plan:  W/C evaluation consult  Teaching Needs: Safety, transfers, toileting, medications, etc  Current Barriers to Discharge: Decreased caregiver support, Hemodialysis, and Behavior with Dementia  Possible Resolutions to Barriers: Family education completed Recommend more supportive wheelchair     Medical Summary Current Status: left hip pain improved. wound healing. HD per nephro. stabile medically for discharge  Barriers to Discharge: Medical stability   Possible Resolutions to Barriers/Weekly Focus: finalize medical plan for discharge   Continued Need for Acute Rehabilitation Level of Care: The patient requires daily medical management by a physician with specialized training in physical medicine and rehabilitation for the following reasons: Direction of a multidisciplinary physical rehabilitation program  to maximize functional independence : Yes Medical management of patient stability for increased activity during participation in an intensive rehabilitation regime.: Yes Analysis of laboratory values and/or radiology reports with any subsequent need for medication adjustment and/or medical intervention. : Yes   I attest that I was present, lead the team conference, and concur with the assessment and plan of the team.   Dorien Chihuahua B 06/22/2021, 4:51 PM

## 2021-06-22 NOTE — Progress Notes (Signed)
PROGRESS NOTE   Subjective/Complaints: Working with therapy , ambulated yesterday  Appreciate nephro note  ROS: Limited due to cognitive/behavioral    Objective:   No results found. No results for input(s): WBC, HGB, HCT, PLT in the last 72 hours.  No results for input(s): NA, K, CL, CO2, GLUCOSE, BUN, CREATININE, CALCIUM in the last 72 hours.   Intake/Output Summary (Last 24 hours) at 06/22/2021 0815 Last data filed at 06/21/2021 1845 Gross per 24 hour  Intake 240 ml  Output 2000 ml  Net -1760 ml         Physical Exam: Vital Signs Blood pressure (!) 144/77, pulse 96, temperature 98.3 F (36.8 C), temperature source Oral, resp. rate 18, height 5\' 9"  (1.753 m), weight 67.4 kg, SpO2 100 %.    General: No acute distress Mood and affect are appropriate Heart: Regular rate and rhythm no rubs murmurs or extra sounds Lungs: Clear to auscultation, breathing unlabored, no rales or wheezes Abdomen: Positive bowel sounds, soft nontender to palpation, nondistended Extremities: No clubbing, cyanosis, or edema  Skin:right bka well healed.   Neuro:  slow to process. Fair insight and awareness. Told me his wife built his prosthesis. Makes good eye contact.  Musculoskeletal: right BKA well shaped.     Assessment/Plan: 1. Functional deficits which require 3+ hours per day of interdisciplinary therapy in a comprehensive inpatient rehab setting. Physiatrist is providing close team supervision and 24 hour management of active medical problems listed below. Physiatrist and rehab team continue to assess barriers to discharge/monitor patient progress toward functional and medical goals  Care Tool:  Bathing  Bathing activity did not occur: Refused Body parts bathed by patient: Right arm, Left arm, Chest, Abdomen, Face, Right upper leg, Left upper leg   Body parts bathed by helper: Left lower leg, Buttocks, Front perineal  area Body parts n/a: Right lower leg   Bathing assist Assist Level: Moderate Assistance - Patient 50 - 74%     Upper Body Dressing/Undressing Upper body dressing Upper body dressing/undressing activity did not occur (including orthotics): Refused What is the patient wearing?: Pull over shirt    Upper body assist Assist Level: Supervision/Verbal cueing    Lower Body Dressing/Undressing Lower body dressing    Lower body dressing activity did not occur: Refused What is the patient wearing?: Pants, Incontinence brief     Lower body assist Assist for lower body dressing: Maximal Assistance - Patient 25 - 49%     Toileting Toileting Toileting Activity did not occur (Clothing management and hygiene only): N/A (no void or bm)  Toileting assist Assist for toileting: 2 Helpers     Transfers Chair/bed transfer  Transfers assist  Chair/bed transfer activity did not occur: Refused  Chair/bed transfer assist level: Contact Guard/Touching assist (slide board)     Locomotion Ambulation   Ambulation assist   Ambulation activity did not occur: Refused  Assist level: Moderate Assistance - Patient 50 - 74% Assistive device: Walker-rolling Max distance: 55   Walk 10 feet activity   Assist  Walk 10 feet activity did not occur: Refused  Assist level: Moderate Assistance - Patient - 50 - 74% Assistive device: Walker-rolling   Walk  50 feet activity   Assist Walk 50 feet with 2 turns activity did not occur: Refused  Assist level: Moderate Assistance - Patient - 50 - 74% Assistive device: Walker-rolling    Walk 150 feet activity   Assist Walk 150 feet activity did not occur: Refused         Walk 10 feet on uneven surface  activity   Assist Walk 10 feet on uneven surfaces activity did not occur: Refused         Wheelchair     Assist Is the patient using a wheelchair?: Yes Type of Wheelchair: Manual Wheelchair activity did not occur:  Refused  Wheelchair assist level: Supervision/Verbal cueing, Set up assist Max wheelchair distance: 150'    Wheelchair 50 feet with 2 turns activity    Assist    Wheelchair 50 feet with 2 turns activity did not occur: Refused   Assist Level: Supervision/Verbal cueing   Wheelchair 150 feet activity     Assist  Wheelchair 150 feet activity did not occur: Refused   Assist Level: Supervision/Verbal cueing   Blood pressure (!) 144/77, pulse 96, temperature 98.3 F (36.8 C), temperature source Oral, resp. rate 18, height 5\' 9"  (1.753 m), weight 67.4 kg, SpO2 100 %.  Medical Problem List and Plan: 1.  L hemiarthroplasty of L hip  secondary to L hip fracture, hx of right BKA- pt states he does not use prosthesis             -patient may  shower               -ELOS/Goals: mod to min A 18-22 days  -Continue CIR therapies including PT, OT  team conf in am  2.  Antithrombotics: -DVT/anticoagulation:  Pharmaceutical: Other (comment)--Eliquis bid             -antiplatelet therapy: Resume ASA.  3. Postoperative pain:  d/c prn hydrocodone.              --scheduled a dose of hydrocodone every morning and monitor for tolerance.  4. Mood: LCSW to follow for evaluation and support.              -antipsychotic agents: N/a 5. Neuropsych: This patient is not capable of making decisions on his own behalf.  -monitor cognition/affect. ?baseline 6. Skin/Wound Care:   Cefadroxil 500 mg/every 36 hour for wound prophylaxis- completed.               --Nystatin cream and powder to perineum and penile meatus.              --Gerhardt's butt cream to sacrum  7. Fluids/Electrolytes/Nutrition: strict I/O. Add 1200 cc FR.  --Discontinued ensure as nephro also on board.  8. T2DM: CBGs 81-181: continue to Monitor BS ac/hs and use SSI for elevated BS. CBG (last 3)  Recent Labs    06/21/21 1203 06/21/21 2017 06/22/21 0556  GLUCAP 120* 94 82     9. ESRD: Schedule HD at the end of the day MWF to   help with tolerance of therapy.              --Strict I/O. Daily wts. Continue Renvela for metabolic bone disease 10. CAD/ICM: Monitor for symptoms with increase in activity. No meds to avoid hypotension w/HD.              --Continue Lipitor.  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  12. Acute on chronic anemia: Monitor H/H with HD labs-  reviewed and stable. Now on Aranesp weekly  13. Diarrhea: Felt to be due to antibiotics. Wife states it has been ongoing x at least 7mo reviewed meds, poss meds include Renvela, as well as lipitor although pt has been on these for longer period of time Will hold atorvastatin for a couple days and see if this improves - discussed renvela with nephro  --bowel pattern mainly shows loose stool in evenings x 2  14. History of right BKA: examined and moving well 15. Food pocketing?- needs D2 diet per wife.   16.  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 Left upper extremity swelling: Requested RN to ice and elevate and to get new ID bandage--appears improved 19. Insomnia: continue scheduled melatonin 3mg  HS, ordered therapeutic grounds pass, and requested therapy to take patient out during the day when possible. 20. Flat affect, decreased socialization outside of family: chaplain and rec therapy consulted.   LOS: 12 days A FACE TO FACE EVALUATION WAS PERFORMED  Charlett Blake 06/22/2021, 8:15 AM

## 2021-06-22 NOTE — Progress Notes (Signed)
Physical Therapy Session Note  Patient Details  Name: Jonathan LOPES Sr. MRN: [id number] Date of Birth: [birth year]  Today's Date: 06/22/2021 PT Individual Time: 0802-0902; 1400-1502  PT Individual Time Calculation (min): 60 min and 62 min  Short Term Goals: Week 1:  PT Short Term Goal 1 (Week 1): STG = LTG due to ELOS PT Short Term Goal 1 - Progress (Week 1): Progressing toward goal Week 2:  PT Short Term Goal 1 (Week 2): STG = LTG due to LOS  Skilled Therapeutic Interventions/Progress Updates:  Session 1 Pt received supine in bed, denied pain and was agreeable to PT. Emphasis of session on transfers in preparation for DC home tomorrow. Pt requested to eat breakfast, provided full supervision due to pt attempting to swallow plastic containers and filling his mouth too full, causing him to choke. Pt donned R prosthetic liner w/set-up assist and performed LE dressing w/max A in supine. Supine <> sit EOB w/CGA and pt donned R prosthetic w/set-up assist. Lateral scoot transfer from bed to [facility] on L side w/CGA and pt transported to ortho gym w/total A for time management.   Pt performed car transfer via slide board on L side w/mod A, therapist placed board for pt. Pt refused to listen to therapist cues and swung his BLEs into car first, requiring mod A to stabilize board and assist pt in scooting while simultaneously breaking his posterior hip precautions. Attempted to educate pt on how transfer could be improved to improve efficiency and adhere to posterior hip precautions, in which he responded "no". Will attempt to practice transfer again in later session w/wife present.   Pt transported back to room w/total A for time management and was left seated in WC in room, ice pack on L hip, all needs in reach.   Session 2  Pt received supine in bed, Erlene Quan w/[employer] and wife present for WC eval. Pt verbalized pain in anus/scrotum, unable to provide quantifying value. Nursing present to administer pain  meds and offered positional changes, ice and distraction for pain relief. Pt performed supine <>sit EOB w/S* and pt donned R prosthetic w/set-up assist. Lateral scoot transfer slowly to L side to WC w/CGA. WC eval completed w/pt sitting in WC, but pt unable to tolerate sitting up in WC due to pain in anus/sacrum. Lateral scoot from [facility] to bed on R side w/CGA and pt doffed prosthetic w/min A. Sit <>supine w/S* and remainder of session spent reviewing HEP w/pt and wife and reviewing posterior hip precautions. Pt was left supine in bed, ice pack on L hip and in L sidelying to reduce pressure from sacrum, all needs in reach.   Therapy Documentation Precautions:  Precautions Precautions: Posterior Hip, Fall Precaution Comments: provided precaution handouts Required Braces or Orthoses: Other Brace Other Brace: right prosthesis Restrictions Weight Bearing Restrictions: Yes RLE Weight Bearing: Non weight bearing LLE Weight Bearing: Weight bearing as tolerated Other Position/Activity Restrictions: Posterior hip precautions   Therapy/Group: Individual Therapy [name] Arcelia Pals, PT, DPT  06/22/2021, 7:43 AM

## 2021-06-22 NOTE — Progress Notes (Signed)
Patient ID: Jonathan [name]., male   DOB: 1951-05-22, 70 y.o.   MRN: [id number] Met with pt and wife to answer her questions regarding discharge tomorrow. She still has concerns regarding his scrotum swelling and loose stools. Have messaged MD to see if can talk with her. Have messaged Robin-PT he was seeing for prosthetic training since wife would like to resume this. Work on discharge for tomorrow will need to be on first run for HD

## 2021-06-22 NOTE — Plan of Care (Signed)
  Problem: RH Bed to Chair Transfers Goal: LTG Patient will perform bed/chair transfers w/assist (PT) Description: LTG: Patient will perform bed to chair transfers with assistance (PT). Outcome: Completed/Met   Problem: RH Car Transfers Goal: LTG Patient will perform car transfers with assist (PT) Description: LTG: Patient will perform car transfers with assistance (PT). Outcome: Completed/Met   Problem: RH Wheelchair Mobility Goal: LTG Patient will propel w/c in home environment (PT) Description: LTG: Patient will propel wheelchair in home environment, # of feet with assistance (PT). Outcome: Completed/Met   Problem: Sit to Stand Goal: LTG:  Patient will perform sit to stand with assistance level (PT) Description: LTG:  Patient will perform sit to stand with assistance level (PT) Outcome: Completed/Met

## 2021-06-22 NOTE — Progress Notes (Addendum)
Wife expressed concerns about his diarrhea--she has talked with multiple people for last few months without good answers and would like work up for it. Referral made to [facility] for work up. Scrotal edema has also been ongoing --min edema noted with some maceration due to ongoing loose stools which patient was sitting in again. She expressed concerns about his rectum and on exam noted to have rectal visible rectal spasms on palpation. Rectal exam revealed soft brown stool in rectal vault--needs to be disimpacted. Relayed to wife that obstipation may be cause of incontinence/loose stools. Order written for milk and molasses enema with disimpaction as able. Also discussed with nurse.   Will order KUB to evaluate stool burden--educated wife that patient may need a bowel program with senna or miralax orally as well as enema 1-2 X a week for evacuation of bowels.

## 2021-06-22 NOTE — Progress Notes (Signed)
[facility] Progress Note   Subjective:   Patient seen and examined in room.  Sitting in wheelchair in between therapy.  Wife at beside. No specific complaints.  Right arm swelling looks a little better today.   Objective Vitals:   06/21/21 1845 06/21/21 2016 06/22/21 0500 06/22/21 0542  BP: 119/71 126/64  (!) 144/77  Pulse:  93  96  Resp: 20 16  18   Temp: 98.3 F (36.8 C) 97.9 F (36.6 C)  98.3 F (36.8 C)  TempSrc:    Oral  SpO2: 97% 98%  100%  Weight: 69.4 kg  67.4 kg   Height:       Physical Exam General:well appearing male in NAD, sitting in wheelchair Heart:RRR, no mrg Lungs:CTAB, nml WOB on RA Abdomen:soft, NTND Extremities:1+ LLE edema, RUE 1+ edema, no edema in R stump - prothesis in place Dialysis Access: RU AVF +b/t   Filed Weights   06/21/21 1523 06/21/21 1845 06/22/21 0500  Weight: 71.2 kg 69.4 kg 67.4 kg    Intake/Output Summary (Last 24 hours) at 06/22/2021 1209 Last data filed at 06/22/2021 0730 Gross per 24 hour  Intake 480 ml  Output 2000 ml  Net -1520 ml    Additional Objective Labs: Basic Metabolic Panel: Recent Labs  Lab 06/16/21 1328  NA 131*  K 4.8  CL 92*  CO2 25  GLUCOSE 135*  BUN 76*  CREATININE 7.37*  CALCIUM 8.5*  PHOS 3.7   Liver Function Tests: Recent Labs  Lab 06/16/21 1328  ALBUMIN 2.3*    CBC: Recent Labs  Lab 06/16/21 1324  WBC 9.0  HGB 8.3*  HCT 25.7*  MCV 95.2  PLT 298    CBG: Recent Labs  Lab 06/21/21 0619 06/21/21 1203 06/21/21 2017 06/22/21 0556 06/22/21 1152  GLUCAP 80 120* 94 82 138*    Medications:   allopurinol  100 mg Oral QHS   apixaban  5 mg Oral BID   aspirin  81 mg Oral Daily   Chlorhexidine Gluconate Cloth  6 each Topical Q0600   darbepoetin (ARANESP) injection - DIALYSIS  150 mcg Intravenous Q Mon-HD   feeding supplement (NEPRO CARB STEADY)  237 mL Oral QHS   folic acid  1 mg Oral Daily   Gerhardt's butt cream   Topical QID   HYDROcodone-acetaminophen  1  tablet Oral Q breakfast   insulin aspart  0-9 Units Subcutaneous TID AC & HS   melatonin  5 mg Oral QHS   multivitamin  1 tablet Oral QHS   nystatin   Topical TID   nystatin cream   Topical BID   polycarbophil  1,250 mg Oral Daily   predniSONE  5 mg Oral Q breakfast    Dialysis Orders: Center: [facility]  on MWF . 180NRe, 4 hour 45min, BFR 400, DFR Auto 1.5, EDW 61.7kg, 2K, 2Ca, AVF 15g Heparin 3000 unit bolus  Calcitriol 1.80mcg PO q HD   Assessment/Plan: 1. L femoral neck frx: s/p fixation per ortho on 10/28.  In CIR.  2. ESRD - on HD MWF. Orders written for HD tomorrow per regular schedule.  3. RUE/AVF swelling - IR consulted to eval.  Fistulogram on 11/1 which was unrevealing with no intervention needed.  Swelling in apwife reports it is about the same.  Advised to elevated.  Will try increased UF to see if responds. No issues with cannulation.  3. Anemia of CKD- Last Hgb 8.3. s/p 2 units pRBC on 10/31, 1 unit on 11/2.  Increase  aranesp to 183mcg qwks. 4. Secondary hyperparathyroidism - Ca and phos in goal.  Plan to hold Renvela a few days and see if diarrhea improves.  Does not typically cause diarrhea but has happened before.  5. HTN/volume - BP controlled.  Not close to EDW if bed weights are accurate.  Edema noted on exam, increased UF goals with HD tomorrow. 6. Nutrition - Renal diet w/fluid restrictions.  7. Diarrhea - chronic issues for several months.  Will try holding Renvela a few days and see if improved.   8. CAD/A fib/Hx LV thrombus - cardio consulted and cleared for surgery.  9. 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 Dementia - per primary  Jen Mow, PA-C [facility] 06/22/2021,12:09 PM  LOS: 12 days

## 2021-06-22 NOTE — Progress Notes (Signed)
Inpatient Rehabilitation Care Coordinator Discharge Note   Patient Details  Name: Jonathan STURGILL Sr. MRN: [id number] Date of Birth: [birth year]   Discharge location: [place] 24/7 [facility]  Length of Stay:  15 DAYS  Discharge activity level: MIN ASSIST LEVEL  Home/community participation: YES  Patient response HC:WCBJSE Literacy - How often do you need to have someone help you when you read instructions, pamphlets, or other written material from your doctor or pharmacy?: Patient unable to respond  Patient response GB:TDVVOH Isolation - How often do you feel lonely or isolated from those around you?: Patient unable to respond  Services provided included: MD, RD, PT, OT, RN, CM, Pharmacy, SW  Financial Services:  Charity fundraiser Utilized: [place] offered to/list presented to: WIFE  Follow-up services arranged:  Outpatient, DME, Patient/Family has no preference for HH/DME agencies    Outpatient Servicies: [place] WAS SEEING BEFORE THIS ADMISSION DME : [facility] [place] 11/15 WILL FOLLOW UP WITH WIFE    Patient response to transportation need: Is the patient able to respond to transportation needs?: No In the past 12 months, has lack of transportation kept you from medical appointments or from getting medications?: No In the past 12 months, has lack of transportation kept you from meetings, work, or from getting things needed for daily living?: No    Comments (or additional information): WIFE WAS [employer].  Patient/Family verbalized understanding of follow-up arrangements:  Yes  Individual responsible for coordination of the follow-up plan: Jonathan [name] [phone]  Confirmed correct DME delivered: Jonathan [name] 06/22/2021    Darik Massing, [place]

## 2021-06-22 NOTE — Discharge Instructions (Addendum)
Inpatient Rehab Discharge Instructions  Jonathan DIBELLA Sr. Discharge date and time:    Activities/Precautions/ Functional Status: Activity: no lifting, driving, or strenuous exercise till cleared by MD Diet: diabetic diet. Limit fluids to 1200 cc/day Wound Care: keep wound clean and dry. Call Dr. Ephraim Hamburger if you develop any problems with your incision/wound--redness, swelling, increase in pain, drainage or if you develop fever or chills.     Functional status:  ___ No restrictions     ___ Walk up steps independently _X__ 24/7 supervision/assistance   ___ Walk up steps with assistance ___ Intermittent supervision/assistance  ___ Bathe/dress independently ___ Walk with walker     _X__ Bathe/dress with assistance ___ Walk Independently    ___ Shower independently ___ Walk with assistance    ___ Shower with assistance _X__ No alcohol     ___ Return to work/school ________  Special Instructions: Continue hip precautions with cleared by orthopedics 2. Monitor for rectal bleeding. Suppository will help shrink hemorrhoids.  Contact MD if bleeding persists or increases.     COMMUNITY REFERRALS UPON DISCHARGE:   Outpatient: PT                Agency:[facility] Phone:[phone]              Appointment Date/Time: WILL CALL WIFE TO SET UP APPOINTMENTS  Medical Equipment/Items Ordered: HAS ALL DME-HAD WHEELCHAIR EVAL 11/15 AND WIFE DECIDING [facility] BED                                                  Agency/Supplier:[facility]  [phone]    My questions have been answered and I understand these instructions. I will adhere to these goals and the provided educational materials after my discharge from the hospital.  Patient/Caregiver Signature _______________________________ Date __________  Clinician Signature _______________________________________ Date __________  Please bring this form and your medication list with you to all your  follow-up doctor's appointments.

## 2021-06-22 NOTE — Progress Notes (Signed)
Occupational Therapy Session Note  Patient Details  Name: Jonathan MEARS Sr. MRN: [id number] Date of Birth: 1950-09-13  Today's Date: 06/22/2021 OT Individual Time: 3435-6861 OT Individual Time Calculation (min): 42 min    Short Term Goals: Week 2:  OT Short Term Goal 1 (Week 2): STGs = LTGs  Skilled Therapeutic Interventions/Progress Updates:  Pt greeted seated in w/c agreeable to OT intervention. Session focus on ADL transfers and BUE therex to increase strength and endurance for higher level functional mobility. Pt completed x2 SB transfers from w/c<>drop arm BSC with MOD A +2 but progressing to MIN A+ 2 when transferring from w/c>BSC going towards pts L side, increased assist needed when transferring from BSC>w/c going towards pts R side, more of MOD A+2. Recommend pt utilize lateral leans for pericare and clothing mgmt, however pt declined practicing lateral leans at this time. Pt required MAX step by step cues to sequence board placement but able to initiate tranfser once board is placed. Pt completed below therex with 2 lb dowel rod: 3x10 OH presses 3x10 bicep curls 3x10 scapular protraction<>retraction with wrist supinated Chest passes 3x10 with 4lb wegihted ball  Wife enter at end of session, provided education on recommendation of SB txfr to bsc.  pt left seated in w/c with alarm belt activated and all needs within reach.                   Therapy Documentation Precautions:  Precautions Precautions: Posterior Hip, Fall Precaution Booklet Issued: Yes (comment) Precaution Comments: provided precaution handouts Required Braces or Orthoses: Other Brace Other Brace: right prosthesis Restrictions Weight Bearing Restrictions: Yes RLE Weight Bearing: Non weight bearing LLE Weight Bearing: Weight bearing as tolerated Other Position/Activity Restrictions: Posterior hip precautions  Pain: pt reprots unrated pain in L hip, provided rest breaks and ice as pain mgmt strategy.      Therapy/Group: Individual Therapy  Corinne Ports [facility] 06/22/2021, 9:44 AM

## 2021-06-22 NOTE — Progress Notes (Signed)
Occupational Therapy Session Note  Patient Details  Name: Jonathan WICKHAM Sr. MRN: [id number] Date of Birth: 01-13-1951  Today's Date: 06/22/2021 OT Individual Time: 1005-1100 OT Individual Time Calculation (min): 55 min    Short Term Goals: Week 2:  OT Short Term Goal 1 (Week 2): STGs = LTGs  Skilled Therapeutic Interventions/Progress Updates:    Pt received in wc dressed and ready for the day.  Pt's wife present.   During this session, we focus on sit to stand strength to prepare for ambulation.  Placed pt facing side of bed to use elevated rail to pull up which he was able to do with min A 2x and tolerated standing for 20 sec each with cues for upright posture.  Pt then taken into hallway and with +2 A he stood with mod A to RW.  Pt always cued to push up from wc vs pulling on RW but his ability to follow directions fluctuates a great deal.  Sometimes he pushed up sometimes he pulls up. When he pulls up, he requires max to stand.   Pt then ambulated approx. 5-10 feet, would sit but then with max encouragement repeated that cycle 5x.   Pt not verbal today and needing max encouragement to engage.   Pt returned to room and then worked with a 3# dowel bar on various arm exercises of sh press, torso twists, elbow flex, elb flex with extension of reaching arms out with hands in supination.  He actually participated well in those skills.  Pt wanted to return to bed. Set up wc adjacent to bed, removed leg rests and placed board under pt s R thigh.  With only min cues, pt used board to get back into bed.  Close Supervision to light CGA only.  He then doffed prosthesis and shoe with A.  Moved to supine with S.  Pt resting in bed with all needs met.  Wife in room with pt.   Therapy Documentation Precautions:  Precautions Precautions: Posterior Hip, Fall Precaution Booklet Issued: Yes (comment) Precaution Comments: provided precaution handouts Required Braces or Orthoses: Other Brace Other  Brace: right prosthesis Restrictions Weight Bearing Restrictions: Yes RLE Weight Bearing: Non weight bearing LLE Weight Bearing: Weight bearing as tolerated Other Position/Activity Restrictions: Posterior hip precautions  Pain: no c/o pain   ADL: ADL Eating: Supervision/safety Grooming: Supervision/safety, Minimal cueing Where Assessed-Grooming: Sitting at sink Upper Body Bathing: Supervision/safety, Minimal cueing Where Assessed-Upper Body Bathing: Sitting at sink Lower Body Bathing: Moderate assistance, Minimal cueing Where Assessed-Lower Body Bathing: Bed level Upper Body Dressing: Supervision/safety Where Assessed-Upper Body Dressing: Sitting at sink Lower Body Dressing: Maximal assistance Where Assessed-Lower Body Dressing: Bed level Toileting: Dependent Where Assessed-Toileting: Bed level, Bedside Commode Toilet Transfer: Moderate assistance Toilet Transfer Method: Theatre manager: Bedside commode ADL Comments: pt has improved participation with his wife present.    Therapy/Group: Individual Therapy  [place] 06/22/2021, 12:17 PM

## 2021-06-22 NOTE — Progress Notes (Signed)
Pt to be d/c from CIR tomorrow. Pt is also due for HD tomorrow. Dialysis Coordinator reached out to provider to inquire if pt could be d/c first thing tomorrow am in order for pt to be at out-pt HD clinic by 11:30 to avoid day of d/c HD. Provider feels this can be accomplished and to coordinate with CSW. Pt's clinic aware pt for d/c in the am and pt to be at clinic by 11:30 for treatment. Spoke to pt's wife at bedside to make her aware of the above info. Wife voices understanding and to work on transportation from hospital to kidney center. Contacted inpt HD unit to request that pt be placed on 2nd shift due to the above plans. Will assist as needed.  Melven Sartorius Renal Navigator [phone]

## 2021-06-22 NOTE — Progress Notes (Signed)
Patient has a new order for milk and molasses enema. Enema given as ordered without results. Patient refused dig stimulation, states no and turns over onto back. Reesa Chew notified that patient did not have a BM. No new orders at this time. Wife at bedside, [place] nurse to monitor for BM.

## 2021-06-22 NOTE — Progress Notes (Signed)
Occupational Therapy Discharge Summary  Patient Details  Name: Jonathan BUTH Sr. MRN: [id number] Date of Birth: August 01, 1951   Patient has met 4 of 4 long term goals due to improved activity tolerance, improved balance, and ability to compensate for deficits.  Patient to discharge at overall Mod Assist level.  Patient's care partner is independent to provide the necessary physical and cognitive assistance at discharge.    Reasons goals not met: n/a  Recommendation:  Patient will benefit from ongoing skilled OT services in home health setting to continue to advance functional skills in the area of BADL and Reduce care partner burden.  Equipment: No equipment provided  Reasons for discharge: treatment goals met  Patient/family agrees with progress made and goals achieved: Yes  OT Discharge Precautions/Restrictions  Precautions Precautions: Posterior Hip;Fall Precaution Comments: provided precaution handouts Other Brace: right prosthesis Restrictions RLE Weight Bearing: Weight bearing as tolerated LLE Weight Bearing: Weight bearing as tolerated Other Position/Activity Restrictions: Posterior hip precautions     ADL ADL Eating: Supervision/safety Grooming: Supervision/safety, Minimal cueing Where Assessed-Grooming: Sitting at sink Upper Body Bathing: Supervision/safety, Minimal cueing Where Assessed-Upper Body Bathing: Sitting at sink Lower Body Bathing: Moderate assistance, Minimal cueing Where Assessed-Lower Body Bathing: Bed level Upper Body Dressing: Supervision/safety Where Assessed-Upper Body Dressing: Sitting at sink Lower Body Dressing: Maximal assistance Where Assessed-Lower Body Dressing: Bed level Toileting: Dependent Where Assessed-Toileting: Bed level, Bedside Commode Toilet Transfer: Moderate assistance Toilet Transfer Method: Theatre manager: Bedside commode ADL Comments: pt has improved participation with his wife  present. Vision Baseline Vision/History: 1 Wears glasses Patient Visual Report: No change from baseline Vision Assessment?: No apparent visual deficits Perception  Perception: Impaired Praxis Praxis: Impaired Praxis Impairment Details: Initiation;Motor planning Cognition Overall Cognitive Status: History of cognitive impairments - at baseline Arousal/Alertness: Lethargic Orientation Level: Oriented to person Year:  (1995) Month: April Day of Week: Incorrect Memory: Impaired Memory Impairment: Storage deficit;Retrieval deficit;Decreased recall of new information;Decreased short term memory;Decreased long term memory Immediate Memory Recall: Sock;Blue;Bed Memory Recall Sock: Not able to recall Memory Recall Blue: Not able to recall Memory Recall Bed: Not able to recall Awareness: Impaired Problem Solving: Impaired Behaviors: Poor frustration tolerance Safety/Judgment: Impaired Sensation Sensation Light Touch: Appears Intact Hot/Cold: Appears Intact Proprioception: Appears Intact Stereognosis: Not tested Coordination Gross Motor Movements are Fluid and Coordinated: No Fine Motor Movements are Fluid and Coordinated: No Coordination and Movement Description: 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, R BKA, impaired motor planning Finger Nose Finger Test: NT Heel Shin Test: NT Motor  Motor Motor: Abnormal tone;Abnormal postural alignment and control Motor - Discharge Observations: Significant kyphotic posture at rest, muscle atrophy of BLEs, unable to correct posture longer than 3-5s Trunk/Postural Assessment  Postural Control Postural Control: Deficits on evaluation  Balance Static Sitting Balance Static Sitting - Level of Assistance: 5: Stand by assistance Dynamic Sitting Balance Dynamic Sitting - Level of Assistance: 4: Min assist Static Standing Balance Static Standing - Level of Assistance: 3: Mod assist Extremity/Trunk Assessment RUE Assessment RUE Assessment: Within  Functional Limits LUE Assessment LUE Assessment: Within Functional Limits   Jonathan [name] 06/22/2021, 1:17 PM

## 2021-06-23 DIAGNOSIS — K625 Hemorrhage of anus and rectum: Secondary | ICD-10-CM

## 2021-06-23 LAB — CBC
HCT: 28 % — ABNORMAL LOW (ref 39.0–52.0)
HCT: 25.7 % — ABNORMAL LOW (ref 39.0–52.0)
HCT: 29.5 % — ABNORMAL LOW (ref 39.0–52.0)
Hemoglobin: 9.1 g/dL — ABNORMAL LOW (ref 13.0–17.0)
Hemoglobin: 8.3 g/dL — ABNORMAL LOW (ref 13.0–17.0)
Hemoglobin: 9.5 g/dL — ABNORMAL LOW (ref 13.0–17.0)
MCH: 30.7 pg (ref 26.0–34.0)
MCH: 30.9 pg (ref 26.0–34.0)
MCH: 30.6 pg (ref 26.0–34.0)
MCHC: 32.5 g/dL (ref 30.0–36.0)
MCHC: 32.3 g/dL (ref 30.0–36.0)
MCHC: 32.2 g/dL (ref 30.0–36.0)
MCV: 94.6 fL (ref 80.0–100.0)
MCV: 95.5 fL (ref 80.0–100.0)
MCV: 95.2 fL (ref 80.0–100.0)
Platelets: 283 10*3/uL (ref 150–400)
Platelets: 295 10*3/uL (ref 150–400)
Platelets: 296 10*3/uL (ref 150–400)
RBC: 2.96 MIL/uL — ABNORMAL LOW (ref 4.22–5.81)
RBC: 2.69 MIL/uL — ABNORMAL LOW (ref 4.22–5.81)
RBC: 3.1 MIL/uL — ABNORMAL LOW (ref 4.22–5.81)
RDW: 19.8 % — ABNORMAL HIGH (ref 11.5–15.5)
RDW: 19.7 % — ABNORMAL HIGH (ref 11.5–15.5)
RDW: 19.9 % — ABNORMAL HIGH (ref 11.5–15.5)
WBC: 8.4 10*3/uL (ref 4.0–10.5)
WBC: 9.2 10*3/uL (ref 4.0–10.5)
WBC: 8.6 10*3/uL (ref 4.0–10.5)
nRBC: 0 % (ref 0.0–0.2)
nRBC: 0 % (ref 0.0–0.2)
nRBC: 0 % (ref 0.0–0.2)

## 2021-06-23 LAB — GLUCOSE, CAPILLARY
Glucose-Capillary: 89 mg/dL (ref 70–99)
Glucose-Capillary: 122 mg/dL — ABNORMAL HIGH (ref 70–99)
Glucose-Capillary: 86 mg/dL (ref 70–99)
Glucose-Capillary: 86 mg/dL (ref 70–99)

## 2021-06-23 LAB — OCCULT BLOOD X 1 CARD TO LAB, STOOL: Fecal Occult Bld: POSITIVE — AB

## 2021-06-23 MED ORDER — DIPHENOXYLATE-ATROPINE 2.5-0.025 MG PO TABS: 1.0000 | ORAL_TABLET | Freq: Every day | ORAL | 0 refills | Status: DC

## 2021-06-23 MED ORDER — DIPHENOXYLATE-ATROPINE 2.5-0.025 MG PO TABS
1.0000 | ORAL_TABLET | Freq: Every day | ORAL | Status: DC
  Administered 2021-06-23 – 2021-06-24 (×2): 1 via ORAL
  Filled 2021-06-23 (×2): qty 1

## 2021-06-23 MED ORDER — SEVELAMER CARBONATE 800 MG PO TABS
800.0000 mg | ORAL_TABLET | Freq: Three times a day (TID) | ORAL | Status: DC
  Administered 2021-06-23 – 2021-06-25 (×5): 800 mg via ORAL
  Filled 2021-06-23 (×5): qty 1

## 2021-06-23 MED ORDER — NYSTATIN 100000 UNIT/GM EX CREA: TOPICAL_CREAM | Freq: Two times a day (BID) | CUTANEOUS | 0 refills | Status: AC

## 2021-06-23 MED ORDER — INSULIN ASPART 100 UNIT/ML IJ SOLN: 0.0000 [IU] | INTRAMUSCULAR | 11 refills | Status: DC | PRN

## 2021-06-23 MED ORDER — NYSTATIN 100000 UNIT/GM EX POWD: Freq: Three times a day (TID) | CUTANEOUS | 0 refills | Status: AC

## 2021-06-23 MED ORDER — MELATONIN 5 MG PO TABS: 5.0000 mg | ORAL_TABLET | Freq: Every day | ORAL | 0 refills | Status: AC

## 2021-06-23 MED ORDER — HYDROCORTISONE ACETATE 25 MG RE SUPP: 25.0000 mg | Freq: Two times a day (BID) | RECTAL | 0 refills | Status: AC

## 2021-06-23 MED ORDER — PEG 3350-KCL-NA BICARB-NACL 420 G PO SOLR
2000.0000 mL | Freq: Once | ORAL | Status: AC
  Administered 2021-06-23: 2000 mL via ORAL
  Filled 2021-06-23: qty 4000

## 2021-06-23 MED ORDER — WITCH HAZEL-GLYCERIN EX PADS
MEDICATED_PAD | CUTANEOUS | Status: DC | PRN
  Filled 2021-06-23: qty 100

## 2021-06-23 MED ORDER — HYDROCODONE-ACETAMINOPHEN 5-325 MG PO TABS: 1.0000 | ORAL_TABLET | Freq: Two times a day (BID) | ORAL | 0 refills | Status: DC | PRN

## 2021-06-23 MED ORDER — BACID PO TABS
2.0000 | ORAL_TABLET | Freq: Three times a day (TID) | ORAL | Status: DC
  Filled 2021-06-23 (×2): qty 2

## 2021-06-23 MED ORDER — APIXABAN 5 MG PO TABS: 5.0000 mg | ORAL_TABLET | Freq: Two times a day (BID) | ORAL | 0 refills | Status: AC

## 2021-06-23 MED ORDER — HYDROCORTISONE ACETATE 25 MG RE SUPP
25.0000 mg | Freq: Two times a day (BID) | RECTAL | Status: DC
  Administered 2021-06-23 – 2021-06-25 (×5): 25 mg via RECTAL
  Filled 2021-06-23 (×4): qty 1

## 2021-06-23 MED ORDER — HEPARIN (PORCINE) 25000 UT/250ML-% IV SOLN
800.0000 [IU]/h | INTRAVENOUS | Status: DC
  Filled 2021-06-23 (×2): qty 250

## 2021-06-23 MED ORDER — GERHARDT'S BUTT CREAM
TOPICAL_CREAM | Freq: Four times a day (QID) | CUTANEOUS | Status: DC
  Administered 2021-06-24: 1 via TOPICAL
  Filled 2021-06-23: qty 1

## 2021-06-23 MED ORDER — RISAQUAD PO CAPS: 2.0000 | ORAL_CAPSULE | Freq: Three times a day (TID) | ORAL | 0 refills | Status: AC

## 2021-06-23 MED ORDER — RISAQUAD PO CAPS
2.0000 | ORAL_CAPSULE | Freq: Three times a day (TID) | ORAL | Status: DC
  Administered 2021-06-23 – 2021-06-25 (×6): 2 via ORAL
  Filled 2021-06-23 (×6): qty 2

## 2021-06-23 MED ORDER — HEPARIN (PORCINE) 25000 UT/250ML-% IV SOLN
950.0000 [IU]/h | INTRAVENOUS | Status: DC
  Administered 2021-06-23: 800 [IU]/h via INTRAVENOUS
  Filled 2021-06-23: qty 250

## 2021-06-23 NOTE — Consult Note (Addendum)
[facility] Consult: 9:38 AM 06/23/2021  LOS: 13 days    Referring Provider: Dr Letta Pate  Primary Care Physician:  Ginger Organ., MD Primary Gastroenterologist:  unassigned.  Previously Charlean Sanfilippo MD at [facility].      Reason for Consultation:  GI Bleed   HPI: Jonathan [name]. is a 70 y.o. male.  PMH ESRD.  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  HD MWF.  PVD, s/p R BKA.  CHF, EF 30 to 35%.  CAD/MI.  LV thrombus.  CVA.  Chronic Eliquis.  Hypertension.  Anemia.  Dementia.  IDDM 2.  Covid 19 PNA 2021.   GI issues Hep C, eradicated 2016 w Ledipasvir + Sofosbuvir under supervision of GI division at [facility], undetectable virus in early 2017.  Repeatedly nonreactive/negative for hep B surface antigen, hep B surface antibody, hep B E antibody, hep B core antibody. 2016 Fibroscan:  4.1kPa 09/2019 CTAP without contrast: Liver normal. 12/2012 colonoscopy in [name] by Dr. Cathlean Cower.  Do not have access to report.  Wife says there were polyps.  Inpatient admission 10/27 -06/10/2021 following mechanical fall related hip fracture.  Status post 10/28 fixation surgery.  Hb ranges 8-9, it dipped to 7.2 on 11/2 and he received 1 PRBC.  For several weeks leading up to the acute hip fracture admission, patient had diarrhea, stools were brown, loose, not bloody.  Had previously suffered constipation and was on stool softeners but when the diarrhea occurred, Colace was discontinued.  Round, nonbloody loose stools persisted in the hospital.  It was suspected that he had constipation with overflow diarrhea as he was taking Vicodin 1 - 2 x daily.  Yesterday afternoon on DRE there was solid stool.  A molasses enema was administered.  This resulted in liquid stool with admixed blood.  This reoccurred this morning.  Eliquis  now on hold, last dose was yesterday evening.  No abdominal pain, no nausea or vomiting.  Appetite depressed but no difficulty eating.  No hemodynamic instability in conjunction with this bleeding.  Blood pressure normotensive, no tacky/bradycardia, oxygen sats mid 90s to 100%. Hgb at 630 this morning was 9.1.  It was 9.3 a little more than 24 hours ago and 8.3 about 16 hours ago.  Sodium low at 131.  Other than low albumin, LFTs were normal 18 d ago.  The BUN and creatinine are elevated consistent with his ESRD.  Potassium normal.    Family history notable for stomach cancer in his mother.  His mom and a brother w DM Previously living at home with his wife.  Nondrinker, no illicit drug use, former smoker.   Past Medical History:  Diagnosis Date   CAD (coronary artery disease)    CHF (congestive heart failure) ([name])    Diabetes mellitus without complication ([place])    type 2   DM (diabetes mellitus) ([place])    ESRD (end stage renal disease) ([place])    FUO (fever of unknown origin) 09/17/2019   Hyperlipidemia    Hypertension    Osteomyelitis ([place])    Peripheral  arterial disease ([place])    PVD (peripheral vascular disease) ([place])    Renal disorder 2015   right kidney transplant   Stroke [facility])    Vascular dementia [facility])     Past Surgical History:  Procedure Laterality Date   ABDOMINAL AORTOGRAM N/A 08/05/2020   Procedure: ABDOMINAL AORTOGRAM;  Surgeon: [name], MD;  Location: [place] CV LAB;  Service: Cardiovascular;  Laterality: N/A;   ABDOMINAL AORTOGRAM W/LOWER EXTREMITY N/A 09/09/2020   Procedure: ABDOMINAL AORTOGRAM W/LOWER EXTREMITY;  Surgeon: [name], MD;  Location: [place] CV LAB;  Service: Cardiovascular;  Laterality: N/A;   AMPUTATION Right 08/06/2020   Procedure: Right Fifth Toe Ray Amputation;  Surgeon: [name], MD;  Location: [place];  Service: Vascular;  Laterality: Right;   AMPUTATION Right 11/02/2020   Procedure: RIGHT AMPUTATION BELOW KNEE;   Surgeon: [name], MD;  Location: [place];  Service: Vascular;  Laterality: Right;   BACK SURGERY     Has had 2 back surgeries   BUBBLE STUDY  09/18/2019   Procedure: BUBBLE STUDY;  Surgeon: Buford Dresser, MD;  Location: [place];  Service: Cardiovascular;;   Depression     ENDARTERECTOMY FEMORAL Right 08/06/2020   Procedure: Right Ilio- Femoral Artery Endarterectomy;  Surgeon: [name], MD;  Location: [facility] OR;  Service: Vascular;  Laterality: Right;   HAND SURGERY Right    HAND SURGERY     HIP ARTHROPLASTY Left 06/04/2021   Procedure: ARTHROPLASTY BIPOLAR HIP (HEMIARTHROPLASTY);  Surgeon: Willaim Sheng, MD;  Location: [place];  Service: Orthopedics;  Laterality: Left;   IR DIALY SHUNT INTRO NEEDLE/INTRACATH INITIAL W/IMG RIGHT Right 06/07/2021   IR REMOVAL TUN CV CATH W/O FL  09/03/2019   KIDNEY TRANSPLANT     November 2015   LEG AMPUTATION BELOW KNEE Right    LOWER EXTREMITY ANGIOGRAPHY Bilateral 08/05/2020   Procedure: LOWER EXTREMITY ANGIOGRAPHY;  Surgeon: [name], MD;  Location: [place] CV LAB;  Service: Cardiovascular;  Laterality: Bilateral;   Memory loss     NEPHRECTOMY TRANSPLANTED ORGAN     PATCH ANGIOPLASTY Right 08/06/2020   Procedure: PATCH ANGIOPLASTY;  Surgeon: [name], MD;  Location[facility];  Service: Vascular;  Laterality: Right;   PERIPHERAL VASCULAR BALLOON ANGIOPLASTY Right 09/09/2020   Procedure: PERIPHERAL VASCULAR BALLOON ANGIOPLASTY;  Surgeon: [name], MD;  Location: [place] CV LAB;  Service: Cardiovascular;  Laterality: Right;  popliteal   PERIPHERAL VASCULAR INTERVENTION Right 08/05/2020   Procedure: PERIPHERAL VASCULAR INTERVENTION;  Surgeon: [name], MD;  Location: [place] CV LAB;  Service: Cardiovascular;  Laterality: Right;  SFA   PERIPHERAL VASCULAR INTERVENTION Right 09/09/2020   Procedure: PERIPHERAL VASCULAR INTERVENTION;  Surgeon: [name], MD;  Location: [place] CV  LAB;  Service: Cardiovascular;  Laterality: Right;  external iliac   TEE WITHOUT CARDIOVERSION N/A 09/18/2019   Procedure: TRANSESOPHAGEAL ECHOCARDIOGRAM (TEE);  Surgeon: Buford Dresser, MD;  Location: [place];  Service: Cardiovascular;  Laterality: N/A;   WOUND DEBRIDEMENT Right 10/08/2020   Procedure: DEBRIDEMENT WOUND RIGHT FOOT;  Surgeon: [name], MD;  Location: [place];  Service: Vascular;  Laterality: Right;    Prior to Admission medications   Medication Sig Start Date End Date Taking? Authorizing Provider  acidophilus (RISAQUAD) CAPS capsule Take 2 capsules by mouth 3 (three) times daily. 06/23/21   Love, Ivan Anchors, PA-C  allopurinol (ZYLOPRIM) 100 MG tablet Take 100 mg by mouth at bedtime. 06/09/14  [provider]  apixaban (ELIQUIS) 5 MG TABS tablet Take 1 tablet (5 mg total) by mouth 2 (two) times daily. 06/23/21   Love, Ivan Anchors, PA-C  aspirin 81 MG chewable tablet Chew 81 mg by mouth daily.  06/09/14   [provider]  atorvastatin (LIPITOR) 40 MG tablet Take 40 mg by mouth at bedtime.    [provider]  b complex-vitamin c-folic acid (NEPHRO-VITE) 0.8 MG TABS tablet Take 1 tablet by mouth daily. 05/08/20   [provider]  diphenoxylate-atropine (LOMOTIL) 2.5-0.025 MG tablet Take 1 tablet by mouth at bedtime. 06/23/21   Love, Ivan Anchors, PA-C  donepezil (ARICEPT) 10 MG tablet Take 1 tablet (10 mg total) by mouth at bedtime. Start half a tablet daily for 4 weeks to be increased to 1 tablet daily if tolerated 05/11/21   Garvin Fila, MD  feeding supplement (BOOST HIGH PROTEIN) LIQD Take 1 Container by mouth daily as needed (nutrition).    [provider]  folic acid (FOLVITE) 1 MG tablet Take 1 tablet (1 mg total) by mouth daily. 05/11/21   Garvin Fila, MD  HYDROcodone-acetaminophen (NORCO/VICODIN) 5-325 MG tablet Take 1 tablet by mouth 2 (two) times daily as needed for moderate pain. 06/23/21   Love, Ivan Anchors, PA-C   hydrocortisone (ANUSOL-HC) 25 MG suppository Place 1 suppository (25 mg total) rectally 2 (two) times daily. 06/23/21   Love, Ivan Anchors, PA-C  insulin aspart (NOVOLOG) 100 UNIT/ML injection Inject 0-9 Units into the skin as needed for high blood sugar. 06/23/21   Love, Ivan Anchors, PA-C  Insulin Pen Needle 32G X 4 MM MISC 1 each by Other route as needed (insulin).  09/10/16   [provider]  levalbuterol Penne Lash HFA) 45 MCG/ACT inhaler Inhale 2 puffs into the lungs every 8 (eight) hours as needed for wheezing. 09/21/19   Raiford Noble Latif, DO  lidocaine-prilocaine (EMLA) cream Apply 1 application topically 3 (three) times a week. 30 minutes prior to Dialysis - MWF 05/08/18   [provider]  melatonin 5 MG TABS Take 1 tablet (5 mg total) by mouth at bedtime. 06/23/21   Love, Ivan Anchors, PA-C  nystatin (MYCOSTATIN/NYSTOP) powder Apply topically 3 (three) times daily. 06/23/21   Love, Ivan Anchors, PA-C  nystatin cream (MYCOSTATIN) Apply topically 2 (two) times daily. 06/23/21   Love, Ivan Anchors, PA-C  polyethylene glycol (MIRALAX / GLYCOLAX) 17 g packet Take 17 g by mouth daily as needed for mild constipation. 06/10/21   Terrilee Croak, MD  predniSONE (DELTASONE) 5 MG tablet Take 5 mg by mouth daily with breakfast. 03/02/20   [provider]  senna-docusate (SENOKOT-S) 8.6-50 MG tablet Take 1 tablet by mouth 2 (two) times daily. Hold if diarrhea Patient taking differently: Take 1 tablet by mouth 2 (two) times daily as needed for mild constipation. 11/06/20   Florencia Reasons, MD  sevelamer carbonate (RENVELA) 800 MG tablet Take 800 mg by mouth 3 (three) times daily with meals. 03/31/20   [provider]    Scheduled Meds:  acidophilus  2 capsule Oral TID   allopurinol  100 mg Oral QHS   Chlorhexidine Gluconate Cloth  6 each Topical Q0600   darbepoetin (ARANESP) injection - DIALYSIS  150 mcg Intravenous Q Mon-HD   diphenoxylate-atropine  1 tablet Oral QHS   feeding supplement (NEPRO  CARB STEADY)  237 mL Oral QHS   folic acid  1 mg Oral Daily   Gerhardt's butt cream   Topical QID  HYDROcodone-acetaminophen  1 tablet Oral Q breakfast   hydrocortisone  25 mg Rectal BID   insulin aspart  0-9 Units Subcutaneous TID AC & HS   melatonin  5 mg Oral QHS   multivitamin  1 tablet Oral QHS   nystatin   Topical TID   nystatin cream   Topical BID   predniSONE  5 mg Oral Q breakfast   Infusions:  PRN Meds: acetaminophen, alum & mag hydroxide-simeth, bisacodyl, diphenhydrAMINE, guaiFENesin-dextromethorphan, HYDROcodone-acetaminophen, polyethylene glycol, prochlorperazine **OR** prochlorperazine **OR** prochlorperazine   Allergies as of 06/10/2021   (No Known Allergies)    Family History  Problem Relation Age of Onset   Diabetes Mellitus II Mother    Cancer Mother        STOMACH   Diabetes Mellitus II Brother    Other Father        UNKOWN   Healthy Daughter    Diabetes Son     Social History   Socioeconomic History   Marital status: Married    Spouse name: [name]   Number of children: Not on file   Years of education: Not on file   Highest education level: Not on file  Occupational History   Not on file  Tobacco Use   Smoking status: Former   Smokeless tobacco: Never  Scientific laboratory technician Use: Never used  Substance and Sexual Activity   Alcohol use: Never   Drug use: Never   Sexual activity: Not on file  Other Topics Concern   Not on file  Social History Narrative   Lives with wife, son, daughter in Sports coach and grandson   Right Handed   Drinks 1 cup caffeine daily   Social Determinants of Health   Financial Resource Strain: Not on file  Food Insecurity: Not on file  Transportation Needs: Not on file  Physical Activity: Not on file  Stress: Not on file  Social Connections: Not on file  Intimate Partner Violence: Not on file    REVIEW OF SYSTEMS: Constitutional: Some weakness.  No reports of fatigue. ENT:  No nose bleeds Pulm: No shortness of  breath or cough. CV:  No palpitations, no LE edema.  No angina. GU:  No hematuria, no frequency GI: See HPI. Heme: Generally no unusual or excessive bleeding/bruising. Transfusions: Charting mentions transfusions in 09/2017, 07/2020 (2 PRBCs), 10/2020 (FFP) Neuro: Expressive aphasia.  No headaches, no peripheral tingling or numbness Derm:  No itching, no rash or sores.  Endocrine:  No sweats or chills.  No polyuria or dysuria Immunization: Reviewed. Travel:  None   PHYSICAL EXAM: Vital signs in last 24 hours: Vitals:   06/22/21 1939 06/23/21 0516  BP: 130/70 140/82  Pulse: 93 89  Resp: 14 14  Temp: 97.8 F (36.6 C) 98.7 F (37.1 C)  SpO2: 94% 97%   Wt Readings from Last 3 Encounters:  06/22/21 67.4 kg  06/09/21 63.2 kg  05/11/21 72.6 kg    General: Patient is frail/chronically ill looking gentleman lying on the bed in no distress. Head: No facial asymmetry or swelling.  No signs of head trauma Eyes: Conjunctiva is pink. Ears: No obvious hearing deficit Nose: No congestion or discharge Mouth: No blood in the mouth.  Oral mucosa is moist, pink, clear. Neck: JVD, no masses, no thyromegaly. Lungs: No labored breathing or cough.  Lungs clear bilaterally. Heart: RRR.  No MRG.  S1, S2 present. Abdomen: Nonobese, nontender.  Active bowel sounds.  No HSM, masses, bruits, hernias..   Rectal:  Stool is soft, brown, fully admixed with blood resulting in a rusty brown color.  There is palpable soft stool in the rectal vault.  Decreased rectal tone.  No obvious source for bleeding. Musc/Skeltl: Bandage covers left hip surgery site. Extremities: Right BKA, stump unremarkable. Neurologic: Alert.  Follows commands, moves all 4 limbs.  No tremors.  Strength not tested.  Expressive aphasia. Skin: No rash, no sores, no suspicious lesions Nodes: No cervical adenopathy Psych: Calm, cooperative.  Intake/Output from previous day: 11/15 0701 - 11/16 0700 In: 480 [P.O.:480] Out: -   Intake/Output this shift: Total I/O In: 240 [P.O.:240] Out: -   LAB RESULTS: Recent Labs    06/23/21 0625  WBC 8.4  HGB 9.1*  HCT 28.0*  PLT 283   BMET Lab Results  Component Value Date   NA 131 (L) 06/16/2021   NA 130 (L) 06/14/2021   NA 137 06/11/2021   K 4.8 06/16/2021   K 5.8 (H) 06/14/2021   K 5.2 (H) 06/11/2021   CL 92 (L) 06/16/2021   CL 94 (L) 06/14/2021   CL 97 (L) 06/11/2021   CO2 25 06/16/2021   CO2 24 06/14/2021   CO2 29 06/11/2021   GLUCOSE 135 (H) 06/16/2021   GLUCOSE 95 06/14/2021   GLUCOSE 67 (L) 06/11/2021   BUN 76 (H) 06/16/2021   BUN 98 (H) 06/14/2021   BUN 76 (H) 06/11/2021   CREATININE 7.37 (H) 06/16/2021   CREATININE 8.98 (H) 06/14/2021   CREATININE 7.58 (H) 06/11/2021   CALCIUM 8.5 (L) 06/16/2021   CALCIUM 8.6 (L) 06/14/2021   CALCIUM 9.0 06/11/2021   LFT No results for input(s): PROT, ALBUMIN, AST, ALT, ALKPHOS, BILITOT, BILIDIR, IBILI in the last 72 hours. PT/INR Lab Results  Component Value Date   INR 1.3 (H) 06/04/2021   INR 2.0 (H) 11/06/2020   INR 1.6 (H) 11/05/2020   Hepatitis Panel No results for input(s): HEPBSAG, HCVAB, HEPAIGM, HEPBIGM in the last 72 hours. C-Diff No components found for: CDIFF Lipase  No results found for: LIPASE  Drugs of Abuse  No results found for: LABOPIA, COCAINSCRNUR, LABBENZ, AMPHETMU, THCU, LABBARB   RADIOLOGY STUDIES: DG Abd 1 View  Result Date: 06/22/2021 CLINICAL DATA:  Diarrhea. EXAM: ABDOMEN - 1 VIEW COMPARISON:  Abdominal x-ray 07/31/2019. FINDINGS: The bowel gas pattern is normal. No radio-opaque calculi. There are vascular calcifications in the pelvis and lower extremities. Vascular stent is seen in the right inguinal region. Left hip arthroplasty is present. IMPRESSION: Nonobstructive, nonspecific bowel gas pattern. Electronically Signed   By: Ronney Asters M.D.   On: 06/22/2021 16:54      IMPRESSION:    Bloody stool following administration molasses enema.  Fecal impaction,  constipation w overflow diarrhea present PTA.  Suspect trauma from enema or stercoral ulcer.  No signs of obstruction (no N/V, abd pain).       Hx CVA, Chronic Eliquis/81 ASA, both held as of this AM, last dose Eliquis 11/15 in PM.      ESRD.  Failed transplant, on Prednisone.  HD MWF.     HCV.  Eradicated 2016     PLAN:       Ordered Golyely 4 L to address fecal impaction/constipation.  Not clear GI will pursue colonoscopy however.  Plan d/w pts wife.     Azucena Freed  06/23/2021, 9:38 AM Phone [phone]  ________________________________________________________________________  Velora Heckler GI MD note:  I personally examined the patient, reviewed the data and agree with the assessment  and plan described above.  I met up with him in HD, he was not interested in answering any questions but seemed alert and oriented otherwise.  I agree that this bleeding sounds minor, probably related to constipation (hemorrhoidal, perhaps stercoral ulcer).  I think purging his bowels with golytely will help determine his path forward.  He is frail with multiple serious comorbid conditions and would be high risk for any type of invasive testing.  Will follow along.   Owens Loffler, MD [facility] Pager [phone]

## 2021-06-23 NOTE — Progress Notes (Signed)
Visible blood in pts stool this AM. PA Daniel Lasana notified. New orders implemented.

## 2021-06-23 NOTE — Progress Notes (Addendum)
Patient with brown stool with some visible blood--was irritated after enema yesterday. CBC this am shows rise in H/H --anusol suppositories Rx and wife instructed to monitor for bleeding or black stools.   Occult stool guaiac ordered--on attempts at rectal exam, patient noted to be lying in loose dark maroon appearing liquid stool. Specimen taken and sent to lab but likely has UGIB. Wife in room and updated as to plans and need to hold d/c. Did not get Eliquis or ASA this am --informed nurse to hold and d/c meds for now. Discussed with Dr. Letta Pate. GI contacted for work up and every 6 hours H/H ordered. [facility] contacted for input and transfer to acute for monitoring as patient has completed his rehab course. Page sent to nephrology regarding above events.

## 2021-06-23 NOTE — Progress Notes (Addendum)
PROGRESS NOTE   Subjective/Complaints: KUB and xrays as below but developed bloody diarrhea this am following a several month hx of non bloody diarrhea ROS: Limited due to cognitive/behavioral    Objective:   DG Abd 1 View  Result Date: 06/22/2021 CLINICAL DATA:  Diarrhea. EXAM: ABDOMEN - 1 VIEW COMPARISON:  Abdominal x-ray 07/31/2019. FINDINGS: The bowel gas pattern is normal. No radio-opaque calculi. There are vascular calcifications in the pelvis and lower extremities. Vascular stent is seen in the right inguinal region. Left hip arthroplasty is present. IMPRESSION: Nonobstructive, nonspecific bowel gas pattern. Electronically Signed   By: Ronney Asters M.D.   On: 06/22/2021 16:54   Recent Labs    06/23/21 0625  WBC 8.4  HGB 9.1*  HCT 28.0*  PLT 283    No results for input(s): NA, K, CL, CO2, GLUCOSE, BUN, CREATININE, CALCIUM in the last 72 hours.   Intake/Output Summary (Last 24 hours) at 06/23/2021 1031 Last data filed at 06/23/2021 0835 Gross per 24 hour  Intake 480 ml  Output --  Net 480 ml         Physical Exam: Vital Signs Blood pressure 140/82, pulse 89, temperature 98.7 F (37.1 C), temperature source Oral, resp. rate 14, height 5\' 9"  (1.753 m), weight 67.4 kg, SpO2 97 %.  General: No acute distress Mood and affect are appropriate Heart: Regular rate and rhythm no rubs murmurs or extra sounds Lungs: Clear to auscultation, breathing unlabored, no rales or wheezes Abdomen: Positive bowel sounds, soft nontender to palpation, nondistended Extremities: No clubbing, cyanosis, or edema   Skin:right bka well healed.   Neuro:  slow to process. Oriented to person and place Makes good eye contact.  Musculoskeletal: right BKA well shaped.     Assessment/Plan: 1. Left hip fx with acute GI bleed , plan to transfer to acute today, was scheduled for d/c from rehab today   Care Tool:  Bathing  Bathing  activity did not occur: Refused Body parts bathed by patient: Right arm, Left arm, Chest, Abdomen, Face, Right upper leg, Left upper leg   Body parts bathed by helper: Left lower leg, Buttocks, Front perineal area Body parts n/a: Right lower leg   Bathing assist Assist Level: Moderate Assistance - Patient 50 - 74%     Upper Body Dressing/Undressing Upper body dressing Upper body dressing/undressing activity did not occur (including orthotics): Refused What is the patient wearing?: Pull over shirt    Upper body assist Assist Level: Supervision/Verbal cueing    Lower Body Dressing/Undressing Lower body dressing    Lower body dressing activity did not occur: Refused What is the patient wearing?: Pants, Incontinence brief     Lower body assist Assist for lower body dressing: Maximal Assistance - Patient 25 - 49%     Toileting Toileting Toileting Activity did not occur (Clothing management and hygiene only): N/A (no void or bm)  Toileting assist Assist for toileting: Dependent - Patient 0%     Transfers Chair/bed transfer  Transfers assist  Chair/bed transfer activity did not occur: Refused  Chair/bed transfer assist level: Contact Guard/Touching assist (lateral scoot)     Locomotion Ambulation   Ambulation assist  Ambulation activity did not occur: Refused  Assist level: Moderate Assistance - Patient 50 - 74% Assistive device: Walker-rolling Max distance: 55   Walk 10 feet activity   Assist  Walk 10 feet activity did not occur: Refused  Assist level: Moderate Assistance - Patient - 50 - 74% Assistive device: Walker-rolling   Walk 50 feet activity   Assist Walk 50 feet with 2 turns activity did not occur: Refused  Assist level: Moderate Assistance - Patient - 50 - 74% Assistive device: Walker-rolling    Walk 150 feet activity   Assist Walk 150 feet activity did not occur: Safety/medical concerns (LLE WBAT, R BKA, Fatigue, pain)         Walk  10 feet on uneven surface  activity   Assist Walk 10 feet on uneven surfaces activity did not occur: Safety/medical concerns (LLE WBAT, R BKA, Fatigue, pain)         Wheelchair     Assist Is the patient using a wheelchair?: Yes Type of Wheelchair: Manual Wheelchair activity did not occur: Refused  Wheelchair assist level: Supervision/Verbal cueing Max wheelchair distance: 150'    Wheelchair 50 feet with 2 turns activity    Assist    Wheelchair 50 feet with 2 turns activity did not occur: Refused   Assist Level: Supervision/Verbal cueing   Wheelchair 150 feet activity     Assist  Wheelchair 150 feet activity did not occur: Refused   Assist Level: Supervision/Verbal cueing   Blood pressure 140/82, pulse 89, temperature 98.7 F (37.1 C), temperature source Oral, resp. rate 14, height 5\' 9"  (1.753 m), weight 67.4 kg, SpO2 97 %.  Medical Problem List and Plan: 1.  L hemiarthroplasty of L hip  secondary to L hip fracture, hx of right BKA- pt states he does not use prosthesis           , was scheduled to leave rehab today , appreciate GI note, given multiple med comorbities no plan for Endoscopy at this time, will monitor CBC x 24hr and if stable should be able to go home in am  2.  Antithrombotics: -DVT/anticoagulation:  Pharmaceutical: Other (comment)--Eliquis bid             -antiplatelet therapy: Resume ASA.  3. Postoperative pain:  d/c prn hydrocodone.              --scheduled a dose of hydrocodone every morning and monitor for tolerance.  4. Mood: LCSW to follow for evaluation and support.              -antipsychotic agents: N/a 5. Neuropsych: This patient is not capable of making decisions on his own behalf.  -monitor cognition/affect. ?baseline 6. Skin/Wound Care:   Cefadroxil 500 mg/every 36 hour for wound prophylaxis- completed.               --Nystatin cream and powder to perineum and penile meatus.              --Gerhardt's butt cream to sacrum  7.  Fluids/Electrolytes/Nutrition: strict I/O. Add 1200 cc FR.  --Discontinued ensure as nephro also on board.  8. T2DM: CBGs 81-181: continue to Monitor BS ac/hs and use SSI for elevated BS. CBG (last 3)  Recent Labs    06/22/21 1707 06/22/21 2022 06/23/21 0614  GLUCAP 146* 140* 89     9. ESRD: Schedule HD at the end of the day MWF to  help with tolerance of therapy.              --  Strict I/O. Daily wts. Continue Renvela for metabolic bone disease 10. CAD/ICM: Monitor for symptoms with increase in activity. No meds to avoid hypotension w/HD.              --Continue Lipitor.  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  12. Acute on chronic anemia: Monitor H/H with HD labs- reviewed and stable. Now on Aranesp weekly  13. Diarrhea: Felt to be due to antibiotics. Wife states it has been ongoing x at least 39mo reviewed meds, poss meds include Renvela, as well as lipitor although pt has been on these for longer period of time Will hold atorvastatin for a couple days and see if this improves - discussed renvela with nephro  --bowel pattern mainly shows loose stool in evenings x 2  14. History of right BKA: examined and moving well 15. Food pocketing?- needs D2 diet per wife.   16.  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 Left upper extremity swelling: Requested RN to ice and elevate and to get new ID bandage--appears improved 19. Insomnia: continue scheduled melatonin 3mg  HS, ordered therapeutic grounds pass, and requested therapy to take patient out during the day when possible. 20. Flat affect, decreased socialization outside of family: chaplain and rec therapy consulted.   LOS: 13 days A FACE TO FACE EVALUATION WAS PERFORMED  Charlett Blake 06/23/2021, 10:31 AM

## 2021-06-23 NOTE — Progress Notes (Signed)
ANTICOAGULATION CONSULT NOTE - Initial Consult  Pharmacy Consult for Heparin Indication:  LV thrombus, Afib, h/o CVA  No Known Allergies  Patient Measurements: Height: 5\' 9"  (175.3 cm) Weight:  (Refused standing wt, Bedscale broken) IBW/kg (Calculated) : 70.7 Heparin Dosing Weight: 67.4 kg  Vital Signs: Temp: 98.9 F (37.2 C) (11/16 1252) Temp Source: Oral (11/16 0516) BP: 146/76 (11/16 1300) Pulse Rate: 92 (11/16 1300)  Labs: Recent Labs    06/23/21 0625  HGB 9.1*  HCT 28.0*  PLT 283    Estimated Creatinine Clearance: 9.1 mL/min (A) (by C-G formula based on SCr of 7.37 mg/dL (H)).   Medical History: Past Medical History:  Diagnosis Date   CAD (coronary artery disease)    CHF (congestive heart failure) ([name])    Diabetes mellitus without complication ([place])    type 2   DM (diabetes mellitus) ([place])    ESRD (end stage renal disease) ([place])    FUO (fever of unknown origin) 09/17/2019   Hyperlipidemia    Hypertension    Osteomyelitis ([place])    Peripheral arterial disease ([name])    PVD (peripheral vascular disease) ([place])    Renal disorder 2015   right kidney transplant   Stroke [facility])    Vascular dementia ([place])     Assessment:  Anticoag: Apixaban 5mg  BID for for hx stroke d/t LV thrombosis and Afib. Eliquis LD 11/15 PM. Transition to IV heparin.  - 06/04/21: Echo with EF 30-35% and +LV thrombus - 11/16: Visible blood in stool noted by RN (s/p enema) . Hgb 9.1 stable to improved>>try HC supp. Stools now maroon. FOBT+.  Discharge from rehab cancelled.  Goal of Therapy:  Heparin level 0.3-0.5 units/ml Monitor platelets by anticoagulation protocol: Yes   Plan:  Start IV heparin for LV thrombus with no bolus and low goal. IV heparin at 800 units/hr (12 units/kg/hr) Check aPTT in 6-8 hrs Daily aPTT, HL, and CBC  Jaielle Dlouhy S. Alford Highland, PharmD, BCPS Clinical Staff Pharmacist [email or web address] Alford Highland, [employer] 06/23/2021,1:18 PM

## 2021-06-23 NOTE — Progress Notes (Signed)
[facility] Progress Note   Subjective:   Patient seen and examined at bedside with wife present.  Discharge delayed due to concern for GIB with bloody diarrhea following enema yesterday.  Denies CP, SOB, abdominal pain and n/v.    Objective Vitals:   06/22/21 0542 06/22/21 1350 06/22/21 1939 06/23/21 0516  BP: (!) 144/77 129/73 130/70 140/82  Pulse: 96 92 93 89  Resp: 18 17 14 14   Temp: 98.3 F (36.8 C) 98 F (36.7 C) 97.8 F (36.6 C) 98.7 F (37.1 C)  TempSrc: Oral Oral Oral Oral  SpO2: 100% 95% 94% 97%  Weight:      Height:       Physical Exam General:well appearing male in NAD Heart:RRR, no mrg Lungs:mostly CTAB, +crackles in RLL, nml WOB on RA Abdomen:soft, NTND Extremities:2+ edema on LLE, 1+ on RUE,trace in hips Dialysis Access: RU AVF +b/t   Filed Weights   06/21/21 1523 06/21/21 1845 06/22/21 0500  Weight: 71.2 kg 69.4 kg 67.4 kg    Intake/Output Summary (Last 24 hours) at 06/23/2021 1024 Last data filed at 06/23/2021 0835 Gross per 24 hour  Intake 480 ml  Output --  Net 480 ml    Additional Objective Labs: Basic Metabolic Panel: Recent Labs  Lab 06/16/21 1328  NA 131*  K 4.8  CL 92*  CO2 25  GLUCOSE 135*  BUN 76*  CREATININE 7.37*  CALCIUM 8.5*  PHOS 3.7   Liver Function Tests: Recent Labs  Lab 06/16/21 1328  ALBUMIN 2.3*   CBC: Recent Labs  Lab 06/16/21 1324 06/23/21 0625  WBC 9.0 8.4  HGB 8.3* 9.1*  HCT 25.7* 28.0*  MCV 95.2 94.6  PLT 298 283    CBG: Recent Labs  Lab 06/22/21 0556 06/22/21 1152 06/22/21 1707 06/22/21 2022 06/23/21 0614  GLUCAP 82 138* 146* 140* 89   Iron Studies: No results for input(s): IRON, TIBC, TRANSFERRIN, FERRITIN in the last 72 hours. Lab Results  Component Value Date   INR 1.3 (H) 06/04/2021   INR 2.0 (H) 11/06/2020   INR 1.6 (H) 11/05/2020   Studies/Results: DG Abd 1 View  Result Date: 06/22/2021 CLINICAL DATA:  Diarrhea. EXAM: ABDOMEN - 1 VIEW COMPARISON:   Abdominal x-ray 07/31/2019. FINDINGS: The bowel gas pattern is normal. No radio-opaque calculi. There are vascular calcifications in the pelvis and lower extremities. Vascular stent is seen in the right inguinal region. Left hip arthroplasty is present. IMPRESSION: Nonobstructive, nonspecific bowel gas pattern. Electronically Signed   By: Ronney Asters M.D.   On: 06/22/2021 16:54    Medications:   acidophilus  2 capsule Oral TID   allopurinol  100 mg Oral QHS   Chlorhexidine Gluconate Cloth  6 each Topical Q0600   darbepoetin (ARANESP) injection - DIALYSIS  150 mcg Intravenous Q Mon-HD   diphenoxylate-atropine  1 tablet Oral QHS   feeding supplement (NEPRO CARB STEADY)  237 mL Oral QHS   folic acid  1 mg Oral Daily   Gerhardt's butt cream   Topical QID   HYDROcodone-acetaminophen  1 tablet Oral Q breakfast   hydrocortisone  25 mg Rectal BID   insulin aspart  0-9 Units Subcutaneous TID AC & HS   melatonin  5 mg Oral QHS   multivitamin  1 tablet Oral QHS   nystatin   Topical TID   nystatin cream   Topical BID   polyethylene glycol-electrolytes  2,000 mL Oral Once   predniSONE  5 mg Oral Q breakfast  Dialysis Orders: Center: [facility]  on MWF . 180NRe, 4 hour 38min, BFR 400, DFR Auto 1.5, EDW 61.7kg, 2K, 2Ca, AVF 15g Heparin 3000 unit bolus  Calcitriol 1.73mcg PO q HD   Assessment/Plan: 1. L femoral neck frx: s/p fixation per ortho on 10/28.  In CIR.  2. ESRD - on HD MWF. HD today per regular schedule.  3. RUE/AVF swelling - IR consulted to eval.  Fistulogram on 11/1 which was unrevealing with no intervention needed. Advised to elevated.  No issues with cannulation.  Believe it is due to volume overload, continue to use max UF as tolerated.  3. Diarrhea/Constipation/Bloody stool - diarrhea suspected to be due to constipation.  Bloody stool noted following enema.  Renvela held to see if improved, will restart.  GI consulting  3. Anemia of CKD- Hgb 9.1, s/p 2 units pRBC on 10/31, 1 unit on  11/2.  Increase aranesp to 131mcg qwks. 4. Secondary hyperparathyroidism - Ca and phos in goal.  Plan to hold Renvela a few days and see if diarrhea improves.  Does not typically cause diarrhea but has happened before.  5. HTN/volume - BP controlled.  Not close to EDW if bed weights are accurate. Increased edema in upper and lower extremities, plan for increase UF with HD today.  6. Nutrition - Renal diet w/fluid restrictions.   8. CAD/A fib/Hx LV thrombus - cardio consulted and cleared for surgery.  9. 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 Dementia - per primary  Jen Mow, PA-C [facility] 06/23/2021,10:24 AM  LOS: 13 days

## 2021-06-23 NOTE — Progress Notes (Signed)
Unable to acquire proper hemocult sample. Will let ongoing shift know.

## 2021-06-23 NOTE — Progress Notes (Signed)
Wife is the caregiver and has been informed/aware of discharge

## 2021-06-23 NOTE — Progress Notes (Signed)
Events of this morning noted. Contacted [facility] and spoke to [place] regarding pt's d/c being cancelled for today and pt to not make it to clinic today. Will follow.   Melven Sartorius Renal Navigator [phone]

## 2021-06-23 NOTE — Discharge Summary (Addendum)
Physician Discharge Summary  Patient ID: Jonathan GONCE Sr. MRN: [id number] DOB/AGE: 1950/10/14 70 y.o.  Admit date: 06/10/2021 Discharge date: 06/25/2021  Discharge Diagnoses:  Principal Problem:   Closed left subtrochanteric femur fracture, sequela Active Problems:   ESRD (end stage renal disease) on dialysis ([name])   Anemia in chronic kidney disease   Vascular dementia without behavioral disturbance ([name])   Hx of right BKA ([name])   Rectal bleeding   Constipation   Disrupted sleep-wake cycle   Post-op pain   Discharged Condition: stable  Significant Diagnostic Studies: DG Abd 1 View  Result Date: 06/22/2021 CLINICAL DATA:  Diarrhea. EXAM: ABDOMEN - 1 VIEW COMPARISON:  Abdominal x-ray 07/31/2019. FINDINGS: The bowel gas pattern is normal. No radio-opaque calculi. There are vascular calcifications in the pelvis and lower extremities. Vascular stent is seen in the right inguinal region. Left hip arthroplasty is present. IMPRESSION: Nonobstructive, nonspecific bowel gas pattern. Electronically Signed   By: Ronney Asters M.D.   On: 06/22/2021 16:54   Labs:  Basic Metabolic Panel: BMP Latest Ref Rng & Units 06/16/2021 06/14/2021 06/11/2021  Glucose 70 - 99 mg/dL 135(H) 95 67(L)  BUN 8 - 23 mg/dL 76(H) 98(H) 76(H)  Creatinine 0.61 - 1.24 mg/dL 7.37(H) 8.98(H) 7.58(H)  Sodium 135 - 145 mmol/L 131(L) 130(L) 137  Potassium 3.5 - 5.1 mmol/L 4.8 5.8(H) 5.2(H)  Chloride 98 - 111 mmol/L 92(L) 94(L) 97(L)  CO2 22 - 32 mmol/L 25 24 29   Calcium 8.9 - 10.3 mg/dL 8.5(L) 8.6(L) 9.0      CBC: Recent Labs  Lab 06/24/21 0546 06/24/21 1209 06/25/21 0931  WBC 8.9 7.2 7.2  NEUTROABS  --   --  5.0  HGB 8.8* 8.8* 9.6*  HCT 27.5* 28.1* 30.7*  MCV 94.5 95.6 97.5  PLT 305 291 282    CBG: Recent Labs  Lab 06/24/21 0616 06/24/21 0645 06/24/21 0659  GLUCAP 48* 54* 91    Brief HPI:   Jonathan AZEEZ Sr. is a 70 y.o. male with history of T2DM, CAD, ESRD s/p failed renal transplant, recurrent  LV thrombus, PAF, vascular dementia, R-BKA who was admitted on 06/03/2021 subsequent left displaced femoral neck fracture.  He was cleared for surgery by cardiology and underwent left hip hemiarthroplasty on 05/27/2021.  Postop was WBAT with hip precautions and bridged from heparin back to Eliquis.  He was treated with cefadroxil x7 days per Ortho input given multiple comorbidities and increased risk of postop infection.  He did require 1 unit PRBCs due to acute blood loss anemia.  IR was consulted due to ongoing issues with RU E swelling for few weeks and fistulogram done without any intervention.  He continued to have limitations completing ADL tasks as well as with mobility.  CIR was recommended due to functional decline.   Hospital Course: [place] was admitted to rehab 06/10/2021 for inpatient therapies to consist of PT and OT at least three hours five days a week. Past admission physiatrist, therapy team and rehab RN have worked together to provide customized collaborative inpatient rehab.  Dialysis has been ongoing on MWF at the end of the day to help with tolerance of therapy.  His blood pressures were monitored on TID basis and have been stable.  He continued to have left upper extremity edema with transient increase due to constriction from his ID tag.  This was removed with decrease in edema.  Melatonin was added to help with sleep-wake disruption.  His diabetes has been monitored  with ac/hs CBG checks and SSI was use prn for tighter BS control.  P.o. intake has been good and his blood sugars have been reasonably controlled on sliding scale insulin.  Wife was advised to resume home scale at discharge. He was found to have MASD on sacrum and under scrotum frequent stooling and this was treated with Gerhardt's Butt cream as well as antifungal powder.    His p.o. intake has been good.  Pain has been controlled with as needed use of oxycodone.He continued to have issues with bowel incontinence  as well as frequent diarrhea.  Enema was ordered due to evidence of constipation and impaction.  He did develop rectal bleeding the next day and GI was consulted for input and Eliquis was placed on hold.  Serial H&H were ordered and were noted to be stable.  Dr. Ardis Hughs felt that hematochezia most likely due to trauma from enema stercoral ulcer in setting of fecal impaction and constipation with overflow diarrhea.  He was treated with 4 L of GoLytely and no further bloody stools noted therefore Eliquis was resumed on 11/16.  Palliative care was consulted to discuss goals of care and family elected on full scope of practice.  His CBC remained stable without any recurrent bloody stools and he was cleared for discharge to home.  Wife was advised to continue monitoring for any signs of bleeding as well as maintaining a regular bowel program.  He has made max gains during his rehab stay and requires mod assist overall. He will continue to receive follow up outpatient PT at [facility] prosthetic clinic after discharge.    Rehab course: During patient's stay in rehab weekly team conferences were held to monitor patient's progress, set goals and discuss barriers to discharge. At admission, patient required total assist with basic ADL tasks and with mobility. He  has had improvement in activity tolerance, balance, postural control as well as ability to compensate for deficits.  He requires mod assist with ADL tasks.  He requires mod assist for sit to stand transfers and to ambulate 55 feet with rolling walker.He is able to propel his wheelchair with supervision.  Family education was completed regarding all aspects of safety and care.  Discharge disposition: 01-Home or Self Care  Diet: Renal diet/1200 cc/FR per day  Special Instructions: Monitor for signs of bleeding. Repeat CBC in a week to monitor H/H. Continue hip precautions.    Discharge Instructions     Ambulatory referral to Gastroenterology   Complete by: As  directed    Evaluate --chronic diarrhea      Allergies as of 06/25/2021   No Known Allergies      Medication List     STOP taking these medications    cefadroxil 500 MG capsule Commonly known as: DURICEF   donepezil 10 MG tablet Commonly known as: ARICEPT   senna-docusate 8.6-50 MG tablet Commonly known as: Senokot-S       TAKE these medications    acidophilus Caps capsule Take 2 capsules by mouth 3 (three) times daily. Notes to patient: Probiotic-- available OTC   allopurinol 100 MG tablet Commonly known as: ZYLOPRIM Take 100 mg by mouth at bedtime.   apixaban 5 MG Tabs tablet Commonly known as: ELIQUIS Take 1 tablet (5 mg total) by mouth 2 (two) times daily.   aspirin 81 MG chewable tablet Chew 81 mg by mouth daily.   atorvastatin 40 MG tablet Commonly known as: LIPITOR Take 40 mg by mouth at bedtime.  b complex-vitamin c-folic acid 0.8 MG Tabs tablet Take 1 tablet by mouth daily.   diphenoxylate-atropine 2.5-0.025 MG tablet Commonly known as: LOMOTIL Take 1 tablet by mouth at bedtime. Notes to patient: For loose stools   feeding supplement Liqd Take 1 Container by mouth daily as needed (nutrition).   folic acid 1 MG tablet Commonly known as: FOLVITE Take 1 tablet (1 mg total) by mouth daily.   Gerhardt's butt cream Crea Apply 1 application topically 4 (four) times daily. Mix desitin with antifugal cream and apply a layer to buttocks/scrotum.   HYDROcodone-acetaminophen 5-325 MG tablet--Rx# 14 pills.  Commonly known as: NORCO/VICODIN Take 1 tablet by mouth 2 (two) times daily as needed for moderate pain. What changed: when to take this   hydrocortisone 25 MG suppository Commonly known as: ANUSOL-HC Place 1 suppository (25 mg total) rectally 2 (two) times daily.   insulin aspart 100 UNIT/ML injection Commonly known as: novoLOG Inject 0-9 Units into the skin as needed for high blood sugar. What changed:  when to take this reasons to  take this   Insulin Pen Needle 32G X 4 MM Misc 1 each by Other route as needed (insulin).   levalbuterol 45 MCG/ACT inhaler Commonly known as: XOPENEX HFA Inhale 2 puffs into the lungs every 8 (eight) hours as needed for wheezing.   lidocaine-prilocaine cream Commonly known as: EMLA Apply 1 application topically 3 (three) times a week. 30 minutes prior to Dialysis - MWF   melatonin 5 MG Tabs Take 1 tablet (5 mg total) by mouth at bedtime.   nystatin cream Commonly known as: MYCOSTATIN Apply topically 2 (two) times daily. Notes to patient: Make a mixture of nystatin cream and desitin and apply to scrotum to prevent irritation/chapping. Reapply after every cleansing.    nystatin powder Commonly known as: MYCOSTATIN/NYSTOP Apply topically 3 (three) times daily.   polyethylene glycol 17 g packet Commonly known as: MIRALAX / GLYCOLAX Take 17 g by mouth daily as needed for mild constipation.   predniSONE 5 MG tablet Commonly known as: DELTASONE Take 5 mg by mouth daily with breakfast.   sevelamer carbonate 800 MG tablet Commonly known as: RENVELA Take 1 tablet (800 mg total) by mouth 3 (three) times daily with meals.   witch hazel-glycerin pad Commonly known as: TUCKS Apply topically as needed for itching.         Follow-up Information     Raulkar, Clide Deutscher, MD Follow up.   Specialty: Physical Medicine and Rehabilitation Contact information: [address] [place] [address] [phone]         Ginger Organ., MD. Call.   Specialty: Internal Medicine Why: for post hospital follow up Contact information: [address] [place] [address] [phone]         Willaim Sheng, MD. Call.   Specialty: Orthopedic Surgery Why: for post op appointment Contact information: [place] [address] [phone]                 Signed: Bary Leriche 07/01/2021, 2:16 AM

## 2021-06-23 NOTE — Progress Notes (Signed)
Inpatient Rehabilitation Discharge Medication Review by a Pharmacist  A complete drug regimen review was completed for this patient to identify any potential clinically significant medication issues.  High Risk Drug Classes Is patient taking? Indication by Medication  Antipsychotic No   Anticoagulant Yes Eliquis for LV thrombus, CVA, afib  Antibiotic No   Opioid Yes Norco for pain s/p hip fx  Antiplatelet No ASA for PVD, h/o CAD, ICM,  CVA prevention,  Hypoglycemics/insulin Yes SSI for DM  Vasoactive Medication No   Chemotherapy No   Other No Chronic prednisone s/p kidney transplant     Clinically significant medication issues were identified that warrant physician communication and completion of prescribed/recommended actions by midnight of the next day:  No  Time spent performing this drug regimen review (minutes):  10 min  Coulter Oldaker S. Alford Highland, PharmD, BCPS Clinical Staff Pharmacist [email or web address] [facility] [place] 06/23/2021 1:28 PM

## 2021-06-24 ENCOUNTER — Encounter: Payer: Medicare Other | Admitting: Physical Therapy

## 2021-06-24 DIAGNOSIS — K625 Hemorrhage of anus and rectum: Secondary | ICD-10-CM

## 2021-06-24 LAB — CBC
HCT: 27 % — ABNORMAL LOW (ref 39.0–52.0)
HCT: 27.5 % — ABNORMAL LOW (ref 39.0–52.0)
HCT: 28.1 % — ABNORMAL LOW (ref 39.0–52.0)
Hemoglobin: 8.6 g/dL — ABNORMAL LOW (ref 13.0–17.0)
Hemoglobin: 8.8 g/dL — ABNORMAL LOW (ref 13.0–17.0)
Hemoglobin: 8.8 g/dL — ABNORMAL LOW (ref 13.0–17.0)
MCH: 30.2 pg (ref 26.0–34.0)
MCH: 30.2 pg (ref 26.0–34.0)
MCH: 29.9 pg (ref 26.0–34.0)
MCHC: 31.9 g/dL (ref 30.0–36.0)
MCHC: 32 g/dL (ref 30.0–36.0)
MCHC: 31.3 g/dL (ref 30.0–36.0)
MCV: 94.7 fL (ref 80.0–100.0)
MCV: 94.5 fL (ref 80.0–100.0)
MCV: 95.6 fL (ref 80.0–100.0)
Platelets: 284 10*3/uL (ref 150–400)
Platelets: 305 10*3/uL (ref 150–400)
Platelets: 291 10*3/uL (ref 150–400)
RBC: 2.85 MIL/uL — ABNORMAL LOW (ref 4.22–5.81)
RBC: 2.91 MIL/uL — ABNORMAL LOW (ref 4.22–5.81)
RBC: 2.94 MIL/uL — ABNORMAL LOW (ref 4.22–5.81)
RDW: 19.9 % — ABNORMAL HIGH (ref 11.5–15.5)
RDW: 20.3 % — ABNORMAL HIGH (ref 11.5–15.5)
RDW: 20.3 % — ABNORMAL HIGH (ref 11.5–15.5)
WBC: 7.2 10*3/uL (ref 4.0–10.5)
WBC: 8.9 10*3/uL (ref 4.0–10.5)
WBC: 7.2 10*3/uL (ref 4.0–10.5)
nRBC: 0 % (ref 0.0–0.2)
nRBC: 0 % (ref 0.0–0.2)
nRBC: 0 % (ref 0.0–0.2)

## 2021-06-24 LAB — APTT
aPTT: 39 seconds — ABNORMAL HIGH (ref 24–36)
aPTT: 73 seconds — ABNORMAL HIGH (ref 24–36)

## 2021-06-24 LAB — HEPARIN LEVEL (UNFRACTIONATED): Heparin Unfractionated: >1.1 IU/mL — ABNORMAL HIGH (ref 0.30–0.70)

## 2021-06-24 LAB — GLUCOSE, CAPILLARY
Glucose-Capillary: 48 mg/dL — ABNORMAL LOW (ref 70–99)
Glucose-Capillary: 54 mg/dL — ABNORMAL LOW (ref 70–99)
Glucose-Capillary: 91 mg/dL (ref 70–99)

## 2021-06-24 MED ORDER — ASPIRIN EC 81 MG PO TBEC
81.0000 mg | DELAYED_RELEASE_TABLET | Freq: Every day | ORAL | Status: DC
  Administered 2021-06-24 – 2021-06-25 (×2): 81 mg via ORAL
  Filled 2021-06-24 (×2): qty 1

## 2021-06-24 MED ORDER — APIXABAN 5 MG PO TABS
5.0000 mg | ORAL_TABLET | Freq: Two times a day (BID) | ORAL | Status: DC
  Administered 2021-06-24 – 2021-06-25 (×3): 5 mg via ORAL
  Filled 2021-06-24 (×3): qty 1

## 2021-06-24 MED ORDER — CHLORHEXIDINE GLUCONATE CLOTH 2 % EX PADS
6.0000 | MEDICATED_PAD | Freq: Every day | CUTANEOUS | Status: DC
  Administered 2021-06-25: 6 via TOPICAL

## 2021-06-24 MED ORDER — PEG 3350-KCL-NA BICARB-NACL 420 G PO SOLR
2000.0000 mL | Freq: Once | ORAL | Status: AC
  Administered 2021-06-24: 13:00:00 2000 mL via ORAL
  Filled 2021-06-24: qty 4000

## 2021-06-24 NOTE — Progress Notes (Addendum)
    Progress Note   Subjective  Chief Complaint: GI bleed and constipation  This morning the patient tells me that he had a fair amount of stool overnight with his bowel prep, his last bowel movement was earlier this morning.  Tells me he has not been seeing any blood in his stool.  Denies any new complaints or concerns.   Objective   Vital signs in last 24 hours: Temp:  [97.8 F (36.6 C)-98.9 F (37.2 C)] 98 F (36.7 C) (11/17 0457) Pulse Rate:  [86-95] 88 (11/17 0457) Resp:  [16-22] 16 (11/17 0457) BP: (130-170)/(67-82) 145/77 (11/17 0457) SpO2:  [100 %] 100 % (11/17 0457) Weight:  [63.3 kg] 63.3 kg (11/17 0459) Last BM Date: 06/23/21 General:    AA male in NAD Heart:  Regular rate and rhythm; no murmurs Lungs: Respirations even and unlabored, lungs CTA bilaterally Abdomen:  Soft, nontender and nondistended. Normal bowel sounds. Psych:  Cooperative. Normal mood and affect.   Lab Results: Recent Labs    06/23/21 2051 06/24/21 0045 06/24/21 0546  WBC 8.6 7.2 8.9  HGB 9.5* 8.6* 8.8*  HCT 29.5* 27.0* 27.5*  PLT 296 284 305   Studies/Results: DG Abd 1 View  Result Date: 06/22/2021 CLINICAL DATA:  Diarrhea. EXAM: ABDOMEN - 1 VIEW COMPARISON:  Abdominal x-ray 07/31/2019. FINDINGS: The bowel gas pattern is normal. No radio-opaque calculi. There are vascular calcifications in the pelvis and lower extremities. Vascular stent is seen in the right inguinal region. Left hip arthroplasty is present. IMPRESSION: Nonobstructive, nonspecific bowel gas pattern. Electronically Signed   By: Ronney Asters M.D.   On: 06/22/2021 16:54      Assessment / Plan:   Assessment: 1.  Hematochezia: Following administration of molasses enema, fecal impaction and constipation with overflow diarrhea present, completed GoLytely 4 L overnight with no further bloody stools per patient, hemoglobin 9.5--> 8.8 overnight; consider most likely trauma from enema or stercoral ulcer 2.  History of CVA: On  Eliquis (now on hold) and aspirin, last dose of Eliquis 11/15 p.m. 3.  ESRD: Failed transplant, on prednisone, HD MWF 4.  HCV: Eradicated in 2016  Plan: 1.  No further bloody bowel movements per patient overnight.  After reviewing nursing notes it is unclear exactly how much GoLytely he finished, but he was having bowel movements. 2.  Given that patient would be very high risk for procedures and hemoglobin has remained relatively stable with no further signs of overt bleeding, no plans for emergent endoscopic work-up.  Will confirm with Dr. Ardis Hughs. 3.  Recommend keeping the patient on a bowel regimen going forward, would likely start on MiraLAX once to twice daily.  Thank you for your kind consultation, please let us know if we may be of any further assistance.   LOS: 14 days   Levin Erp  06/24/2021, 10:44 AM   ________________________________________________________________________  Velora Heckler GI MD note:  I personally examined the patient, reviewed the data and agree with the assessment and plan described above.  NO further observed bleeding.  Very high risk for procedures and so seems most reasonable to keep his bowels moving easily with daily miralax.  Certainly if he has significant rebleeding we should be contacted.  I am happy to see him in my office after he recovers from his acute illnesses and can reconsider elective colonoscopy at that point.  Please call or page with any further questions or concerns.   Owens Loffler, MD [facility] Gastroenterology Pager [phone]

## 2021-06-24 NOTE — Progress Notes (Signed)
This morning patient's CBG reading was 48. Patient was coherent and able to drink orange juice. Patient was able to eat breakfast this morning and blood sugar reading is now 91.

## 2021-06-24 NOTE — Progress Notes (Signed)
ANTICOAGULATION CONSULT NOTE   Pharmacy Consult for Heparin Indication:  LV thrombus, Afib, h/o CVA  No Known Allergies  Patient Measurements: Height: 5\' 9"  (175.3 cm) Weight: 63.3 kg (139 lb 8.8 oz) IBW/kg (Calculated) : 70.7 Heparin Dosing Weight: 67.4 kg  Vital Signs: Temp: 98 F (36.7 C) (11/17 0457) BP: 145/77 (11/17 0457) Pulse Rate: 88 (11/17 0457)  Labs: Recent Labs    06/23/21 2051 06/24/21 0045 06/24/21 0545 06/24/21 0546  HGB 9.5* 8.6*  --  8.8*  HCT 29.5* 27.0*  --  27.5*  PLT 296 284  --  305  APTT  --   --  39*  --   HEPARINUNFRC  --  >1.10*  --   --      Estimated Creatinine Clearance: 8.6 mL/min (A) (by C-G formula based on SCr of 7.37 mg/dL (H)).  Assessment:  Anticoag: Apixaban 5mg  BID for for hx stroke d/t LV thrombosis and Afib. Eliquis LD 11/15 PM. Transition to IV heparin with FOBT+.  Heparin level > 1.1 (affected by Eliquis), aPTT 39 sec (subtherapeutic) on gtt at 800 units/hr. RN reports no issues with gtt overnight and one large stool that had a small amount of blood (like a streak or two.)   Goal of Therapy:  Heparin level 0.3-0.5 units/ml, aPTT 66-85 sec Monitor platelets by anticoagulation protocol: Yes   Plan:  Increase IV heparin to 950 units/hr  F/u 8hr aPTT  Sherlon Handing, PharmD, BCPS Please see amion for complete clinical pharmacist phone list 06/24/2021,6:28 AM

## 2021-06-24 NOTE — Progress Notes (Addendum)
Providers planning possible d/c tomorrow am if pt remains stable. Spoke to [place], Therapist, sports at [facility] to advise clinic staff that pt is for possible d/c tomorrow am to receive treatment at clinic after d/c. Pt will need to arrive by 11:30. Providers made aware of this info. Also met with pt's wife at bedside. Wife aware of pt's possible d/c tomorrow am if remains stable. Wife agreeable to transport pt to out-pt HD clinic from hospital at d/c. Providers made aware of this info as well. Renal PA to send orders to clinic for treatment tomorrow should pt be stable for d/c. Inpt HD unit contacted to request pt be placed on 2nd shift due to above plans. Will assist as needed.  Melven Sartorius Renal Navigator [phone]

## 2021-06-24 NOTE — Progress Notes (Signed)
[facility] Progress Note   Subjective:   Patient seen and examined at bedside.  Not in the mood to interact today, looks away with each question I ask.  No new complaints reported by wife at bedside.   Objective Vitals:   06/23/21 1620 06/23/21 2040 06/24/21 0457 06/24/21 0459  BP: 130/75 132/67 (!) 145/77   Pulse:   88   Resp: 20 20 16    Temp: 97.8 F (36.6 C) 98.3 F (36.8 C) 98 F (36.7 C)   TempSrc:      SpO2: 100% 100% 100%   Weight:    63.3 kg  Height:       Physical Exam General:well appearing male in NAD Heart:RRR, no mrg Lungs:CTAB, nml WOB on RA Abdomen:soft, NTND Extremities:1+ edema on L, R BKA, trace to 1+ edema in RUE Dialysis Access: RU AVF +b/t   Filed Weights   06/22/21 0500 06/24/21 0459  Weight: 67.4 kg 63.3 kg    Intake/Output Summary (Last 24 hours) at 06/24/2021 1138 Last data filed at 06/24/2021 0834 Gross per 24 hour  Intake 543.89 ml  Output 3501 ml  Net -2957.11 ml    CBC: Recent Labs  Lab 06/23/21 0625 06/23/21 1200 06/23/21 2051 06/24/21 0045 06/24/21 0546  WBC 8.4 9.2 8.6 7.2 8.9  HGB 9.1* 8.3* 9.5* 8.6* 8.8*  HCT 28.0* 25.7* 29.5* 27.0* 27.5*  MCV 94.6 95.5 95.2 94.7 94.5  PLT 283 295 296 284 305   CBG: Recent Labs  Lab 06/23/21 1651 06/23/21 2122 06/24/21 0616 06/24/21 0645 06/24/21 0659  GLUCAP 86 86 48* 54* 91    Studies/Results: DG Abd 1 View  Result Date: 06/22/2021 CLINICAL DATA:  Diarrhea. EXAM: ABDOMEN - 1 VIEW COMPARISON:  Abdominal x-ray 07/31/2019. FINDINGS: The bowel gas pattern is normal. No radio-opaque calculi. There are vascular calcifications in the pelvis and lower extremities. Vascular stent is seen in the right inguinal region. Left hip arthroplasty is present. IMPRESSION: Nonobstructive, nonspecific bowel gas pattern. Electronically Signed   By: Ronney Asters M.D.   On: 06/22/2021 16:54    Medications:  heparin 950 Units/hr (06/24/21 5852)    acidophilus  2 capsule Oral TID    allopurinol  100 mg Oral QHS   Chlorhexidine Gluconate Cloth  6 each Topical Q0600   darbepoetin (ARANESP) injection - DIALYSIS  150 mcg Intravenous Q Mon-HD   diphenoxylate-atropine  1 tablet Oral QHS   feeding supplement (NEPRO CARB STEADY)  237 mL Oral QHS   folic acid  1 mg Oral Daily   Gerhardt's butt cream   Topical QID   HYDROcodone-acetaminophen  1 tablet Oral Q breakfast   hydrocortisone  25 mg Rectal BID   melatonin  5 mg Oral QHS   multivitamin  1 tablet Oral QHS   nystatin   Topical TID   nystatin cream   Topical BID   predniSONE  5 mg Oral Q breakfast   sevelamer carbonate  800 mg Oral TID WC    Dialysis Orders: Center: [facility]  on MWF . 180NRe, 4 hour 45min, BFR 400, DFR Auto 1.5, EDW 61.7kg, 2K, 2Ca, AVF 15g Heparin 3000 unit bolus  Calcitriol 1.6mcg PO q HD   Assessment/Plan: 1. L femoral neck frx: s/p fixation per ortho on 10/28.  In CIR.  2. ESRD - on HD MWF. HD tomorrow per regular schedule.  Can be completed at OP center if discharged.  3. RUE/AVF swelling - IR consulted to eval.  Fistulogram on 11/1 which was  unrevealing with no intervention needed. Advised to elevated.  No issues with cannulation.  Believe it is due to volume overload, continue to use max UF as tolerated.  3. Diarrhea/Constipation/Bloody stool - diarrhea suspected to be due to constipation.  Bloody stool noted following enema, no additional bleeding.  Renvela held to see if improved, will restart.  GI recommending daily miralax.    3. Anemia of CKD- Hgb 8.8 today, s/p 2 units pRBC on 10/31, 1 unit on 11/2.  Increase aranesp to 155mcg qwk. 4. Secondary hyperparathyroidism - Ca and phos in goal.  Renvela restarted.  5. HTN/volume - BP mildly elevated.  Closer to EDW if weights correct.  Edema in upper and lower extremities still present but improving. Plan for increase UF with HD tomorrow. May need to lower dry.  6. Nutrition - Renal diet w/fluid restrictions.   8. CAD/A fib/Hx LV thrombus - cardio  consulted and cleared for surgery.  9. 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 Dementia - per primary  Jen Mow, PA-C [facility] 06/24/2021,11:38 AM  LOS: 14 days

## 2021-06-24 NOTE — Progress Notes (Signed)
During shift this nurse has attempted to administer and encourage patient to drink GoLytely per order. Patient will take a few sips of GoLytely then put it down and refuse to drink more. Patient has had one loose soft large bm during shift. Danella Sensing NP notified. No new orders obtained at this time.

## 2021-06-24 NOTE — Progress Notes (Addendum)
Patient ID: Jonathan [name]., male   DOB: 1951-08-08, 70 y.o.   MRN: [id number] MD feels will be ready for discharge tomorrow as long as labs look good. Wife to transport to OP-HD appointment will need to leave by 11:00 am. Team updated on plan

## 2021-06-24 NOTE — Progress Notes (Signed)
Patient ID: [name]., male   DOB: February 01, 1951, 70 y.o.   MRN: [id number]    Progress Note from the Palliative Medicine Team at [facility]   Patient Name: Jonathan WEATHINGTON Sr.        Date: 06/24/2021 DOB: July 18, 1951  Age: 70 y.o. MRN#: [id number] Attending Physician: Charlett Blake, MD Primary Care Physician: Ginger Organ., MD Admit Date: 06/10/2021   Medical records reviewed   70 y.o. male  admitted on 06/10/2021 with  PMH of T2DM, CAD, ICM w/chronic systolic CHF, ESRD s/p failed renal transplant -HD MWF, recurrent LV thrombus, PAF, CVA, vascular dementia, R-BKA who was admitted on 06/03/21 after falling while attempting to use his prosthesis.    He had immediate onset of pain with difficulty walking due to left displaced femoral neck fracture/ underwent left hip hemi on 06/04/21.    Admitted to rehab on 06/10/2021 for inpatient therapies.  Anticipated discharge in the next few days.   Patient and family face treatment option decisions,  advance directive decisions and anticipatory care needs.     This NP visited patient at the bedside as a follow up for palliative medicine needs and emotional support     Wife at bedside.   Currently patient is non-verbal and not engaged in today conversation at all.  He is awake and makes eye contact, not following commands  Education offered on natural trajectory of dementia  and its associated risks of continued physical, functional and cognitive decline.   Plan of Care: -Full code      -Educated family to consider DNR/DNI status understanding evidenced based poor outcomes in similar hospitalized patient, as the cause of arrest is likely associated with advanced chronic illness rather than an easily reversible acute cardio-pulmonary event.  -continue dialysis -family is open to all offered and available medical interventions to  prolong life. - dc home with home health, open to OP community based pallaitive services  Discussed  with wife the importance of continued conversation with family and the medical providers regarding overall plan of care and treatment options,  ensuring decisions are within the context of the patients values and GOCs.  Again education offered on MOST form, declined completion   Questions and concerns addressed   Discussed with Jonathan Liming PA-C  Total time spent on the unit was 25 minutes  Greater than 50% of the time was spent in counseling and coordination of care  Wadie Lessen NP  Palliative Medicine Team Team Phone # [phone] Pager [phone]

## 2021-06-24 NOTE — Progress Notes (Signed)
Pt has had 1267ml of the golytley. Bms have been small and brown without trace of blood being noted

## 2021-06-24 NOTE — Progress Notes (Signed)
PROGRESS NOTE   Subjective/Complaints: Appreciate GI note , hgb have remained relatively stable compared to baseline , no abd pain KUB non obstructive , poor intake  Moderate amt of stool  ROS: Limited due to cognitive/behavioral    Objective:   DG Abd 1 View  Result Date: 06/22/2021 CLINICAL DATA:  Diarrhea. EXAM: ABDOMEN - 1 VIEW COMPARISON:  Abdominal x-ray 07/31/2019. FINDINGS: The bowel gas pattern is normal. No radio-opaque calculi. There are vascular calcifications in the pelvis and lower extremities. Vascular stent is seen in the right inguinal region. Left hip arthroplasty is present. IMPRESSION: Nonobstructive, nonspecific bowel gas pattern. Electronically Signed   By: Ronney Asters M.D.   On: 06/22/2021 16:54   Recent Labs    06/24/21 0045 06/24/21 0546  WBC 7.2 8.9  HGB 8.6* 8.8*  HCT 27.0* 27.5*  PLT 284 305     No results for input(s): NA, K, CL, CO2, GLUCOSE, BUN, CREATININE, CALCIUM in the last 72 hours.   Intake/Output Summary (Last 24 hours) at 06/24/2021 0744 Last data filed at 06/24/2021 0458 Gross per 24 hour  Intake 543.89 ml  Output 3501 ml  Net -2957.11 ml         Physical Exam: Vital Signs Blood pressure (!) 145/77, pulse 88, temperature 98 F (36.7 C), resp. rate 16, height 5\' 9"  (1.753 m), weight 63.3 kg, SpO2 100 %.  General: No acute distress Mood and affect are appropriate Heart: Regular rate and rhythm no rubs murmurs or extra sounds Lungs: Clear to auscultation, breathing unlabored, no rales or wheezes Abdomen: Positive bowel sounds, soft nontender to palpation, nondistended Extremities: No clubbing, cyanosis, or edema   Skin:right bka well healed.   Neuro:  slow to process. Oriented to person and place Makes good eye contact.  Musculoskeletal: right BKA well shaped.     Assessment/Plan: 1. Left hip fx with acute GI bleed , plan to transfer to acute today, was scheduled  for d/c from rehab today   Care Tool:  Bathing  Bathing activity did not occur: Refused Body parts bathed by patient: Right arm, Left arm, Chest, Abdomen, Face, Right upper leg, Left upper leg   Body parts bathed by helper: Left lower leg, Buttocks, Front perineal area Body parts n/a: Right lower leg   Bathing assist Assist Level: Moderate Assistance - Patient 50 - 74%     Upper Body Dressing/Undressing Upper body dressing Upper body dressing/undressing activity did not occur (including orthotics): Refused What is the patient wearing?: Pull over shirt    Upper body assist Assist Level: Supervision/Verbal cueing    Lower Body Dressing/Undressing Lower body dressing    Lower body dressing activity did not occur: Refused What is the patient wearing?: Pants, Incontinence brief     Lower body assist Assist for lower body dressing: Maximal Assistance - Patient 25 - 49%     Toileting Toileting Toileting Activity did not occur (Clothing management and hygiene only): N/A (no void or bm)  Toileting assist Assist for toileting: Dependent - Patient 0%     Transfers Chair/bed transfer  Transfers assist  Chair/bed transfer activity did not occur: Refused  Chair/bed transfer assist level: Contact Guard/Touching assist (  lateral scoot)     Locomotion Ambulation   Ambulation assist   Ambulation activity did not occur: Refused  Assist level: Moderate Assistance - Patient 50 - 74% Assistive device: Walker-rolling Max distance: 55   Walk 10 feet activity   Assist  Walk 10 feet activity did not occur: Refused  Assist level: Moderate Assistance - Patient - 50 - 74% Assistive device: Walker-rolling   Walk 50 feet activity   Assist Walk 50 feet with 2 turns activity did not occur: Refused  Assist level: Moderate Assistance - Patient - 50 - 74% Assistive device: Walker-rolling    Walk 150 feet activity   Assist Walk 150 feet activity did not occur: Safety/medical  concerns (LLE WBAT, R BKA, Fatigue, pain)         Walk 10 feet on uneven surface  activity   Assist Walk 10 feet on uneven surfaces activity did not occur: Safety/medical concerns (LLE WBAT, R BKA, Fatigue, pain)         Wheelchair     Assist Is the patient using a wheelchair?: Yes Type of Wheelchair: Manual Wheelchair activity did not occur: Refused  Wheelchair assist level: Supervision/Verbal cueing Max wheelchair distance: 150'    Wheelchair 50 feet with 2 turns activity    Assist    Wheelchair 50 feet with 2 turns activity did not occur: Refused   Assist Level: Supervision/Verbal cueing   Wheelchair 150 feet activity     Assist  Wheelchair 150 feet activity did not occur: Refused   Assist Level: Supervision/Verbal cueing   Blood pressure (!) 145/77, pulse 88, temperature 98 F (36.7 C), resp. rate 16, height 5\' 9"  (1.753 m), weight 63.3 kg, SpO2 100 %.  Medical Problem List and Plan: 1.  L hemiarthroplasty of L hip  secondary to L hip fracture, hx of right BKA- pt states he does not use prosthesis           , was scheduled to leave rehab today , appreciate GI note, given multiple med comorbities no plan for Endoscopy at this time, will monitor CBC x 24hr and if stable should be able to go home if ok with GI GI impression possible stercoral ulcer vs hemorrhoids related to constipation  KUB shows moderate amt of stool- encourage use of Go lytely  2.  Antithrombotics: -DVT/anticoagulation:  Pharmaceutical: Other (comment)--Eliquis bid- on hold switched to IV heparin in case anticoag needs to be reversed , need GI input in regards to resuming eliquis and stopping IV Heparin              -antiplatelet therapy: Resume ASA.  3. Postoperative pain:  d/c prn hydrocodone.              --scheduled a dose of hydrocodone every morning and monitor for tolerance.  4. Mood: LCSW to follow for evaluation and support.              -antipsychotic agents: N/a 5.  Neuropsych: This patient is not capable of making decisions on his own behalf.  -monitor cognition/affect. ?baseline 6. Skin/Wound Care:   Cefadroxil 500 mg/every 36 hour for wound prophylaxis- completed.               --Nystatin cream and powder to perineum and penile meatus.              --Gerhardt's butt cream to sacrum  7. Fluids/Electrolytes/Nutrition: strict I/O. Add 1200 cc FR.  --Discontinued ensure as nephro also on board.  8. T2DM:  CBGs 81-181: continue to Monitor BS ac/hs and use SSI for elevated BS. CBG (last 3)  Recent Labs    06/24/21 0616 06/24/21 0645 06/24/21 0659  GLUCAP 48* 54* 91   Receives minimal insulin , 1 U yesterday and 3 U prior day may d/c SSI   9. ESRD: Schedule HD at the end of the day MWF to  help with tolerance of therapy.              --Strict I/O. Daily wts. Continue Renvela for metabolic bone disease 10. CAD/ICM: Monitor for symptoms with increase in activity. No meds to avoid hypotension w/HD.              --Continue Lipitor.  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  12. Acute on chronic anemia: Monitor H/H with HD labs- reviewed and stable. Now on Aranesp weekly  13. Diarrhea: Felt to be due to antibiotics. Wife states it has been ongoing x at least 68mo reviewed meds, poss meds include Renvela, as well as lipitor although pt has been on these for longer period of time Will hold atorvastatin for a couple days and see if this improves - discussed renvela with nephro  --bowel pattern mainly shows loose stool in evenings x 2  14. History of right BKA: examined and moving well 15. Food pocketing?- needs D2 diet per wife.   16.  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 Left upper extremity swelling: Requested RN to ice and elevate and to get new ID bandage--appears improved 19. Insomnia: continue scheduled melatonin 3mg  HS, ordered therapeutic grounds pass, and  requested therapy to take patient out during the day when possible. 20. Flat affect, decreased socialization outside of family: chaplain and rec therapy consulted.   LOS: 14 days A FACE TO FACE EVALUATION WAS PERFORMED  Charlett Blake 06/24/2021, 7:44 AM

## 2021-06-25 LAB — CBC WITH DIFFERENTIAL/PLATELET
Abs Immature Granulocytes: 0.03 10*3/uL (ref 0.00–0.07)
Basophils Absolute: 0 10*3/uL (ref 0.0–0.1)
Basophils Relative: 1 %
Eosinophils Absolute: 0.2 10*3/uL (ref 0.0–0.5)
Eosinophils Relative: 3 %
HCT: 30.7 % — ABNORMAL LOW (ref 39.0–52.0)
Hemoglobin: 9.6 g/dL — ABNORMAL LOW (ref 13.0–17.0)
Immature Granulocytes: 0 %
Lymphocytes Relative: 16 %
Lymphs Abs: 1.2 10*3/uL (ref 0.7–4.0)
MCH: 30.5 pg (ref 26.0–34.0)
MCHC: 31.3 g/dL (ref 30.0–36.0)
MCV: 97.5 fL (ref 80.0–100.0)
Monocytes Absolute: 0.8 10*3/uL (ref 0.1–1.0)
Monocytes Relative: 11 %
Neutro Abs: 5 10*3/uL (ref 1.7–7.7)
Neutrophils Relative %: 69 %
Platelets: 282 10*3/uL (ref 150–400)
RBC: 3.15 MIL/uL — ABNORMAL LOW (ref 4.22–5.81)
RDW: 20.4 % — ABNORMAL HIGH (ref 11.5–15.5)
WBC: 7.2 10*3/uL (ref 4.0–10.5)
nRBC: 0 % (ref 0.0–0.2)

## 2021-06-25 MED ORDER — GERHARDT'S BUTT CREAM: 1.0000 "application " | TOPICAL_CREAM | Freq: Four times a day (QID) | CUTANEOUS | Status: AC

## 2021-06-25 MED ORDER — WITCH HAZEL-GLYCERIN EX PADS: MEDICATED_PAD | CUTANEOUS | 12 refills | Status: AC | PRN

## 2021-06-25 MED ORDER — SEVELAMER CARBONATE 800 MG PO TABS: 800.0000 mg | ORAL_TABLET | Freq: Three times a day (TID) | ORAL | 0 refills | Status: AC

## 2021-06-25 NOTE — Plan of Care (Signed)
  Problem: RH Bed Mobility Goal: LTG Patient will perform bed mobility with assist (PT) Description: LTG: Patient will perform bed mobility with assistance, with/without cues (PT). Outcome: Completed/Met

## 2021-06-25 NOTE — Progress Notes (Signed)
     Subjective:  Patient still in rehab. Pain well controlled. No family at bedside. Patient reports no issues. Incision is open to air with no dressing.  Objective:   VITALS:   Vitals:   06/24/21 1442 06/24/21 2013 06/25/21 0504 06/25/21 0526  BP: 132/70 139/70 (!) 144/81   Pulse: 92 94 90   Resp: 18 18 17    Temp: 98.4 F (36.9 C) 97.6 F (36.4 C) 98.4 F (36.9 C)   TempSrc: Oral Oral Oral   SpO2: 95% 97% 94%   Weight:    63 kg  Height:        Sensation intact distally Dorsiflexion/Plantar flexion intact Tolerates hip ROM with no significant discomfort. Incision: incision C/D/I, no erythema or drainage from incision. Healing well. Moderate expected postoperative swelling over the lateral hip Compartment soft    Lab Results  Component Value Date   WBC 7.2 06/24/2021   HGB 8.8 (L) 06/24/2021   HCT 28.1 (L) 06/24/2021   MCV 95.6 06/24/2021   PLT 291 06/24/2021   BMET    Component Value Date/Time   NA 131 (L) 06/16/2021 1328   K 4.8 06/16/2021 1328   CL 92 (L) 06/16/2021 1328   CO2 25 06/16/2021 1328   GLUCOSE 135 (H) 06/16/2021 1328   BUN 76 (H) 06/16/2021 1328   CREATININE 7.37 (H) 06/16/2021 1328   CALCIUM 8.5 (L) 06/16/2021 1328   GFRNONAA 7 (L) 06/16/2021 1328     Assessment/Plan:     Principal Problem:   Closed left subtrochanteric femur fracture, sequela Active Problems:   ESRD (end stage renal disease) on dialysis [facility])   Vascular dementia without behavioral disturbance ([name])   Hx of right BKA ([name])   Rectal bleeding  Left hip hemiarthroplasty for femoral neck fracture  Post op recs: WB: WBAT with posterior hip precautions x6 weeks Abx: ancef x23 hours post op, followed by cefadroxil 500 twice daily for 7 days given increased risk for postop infection given the patient's medical comorbidities Dressing: okay to leave open to air. DVT prophylaxis: resumed home eliquis Follow up: 4 weeks post discharge surgery for a wound check and xrays  with Dr. Zachery Dakins at [facility].  Address: [address] [place], [place], [place] [address]  Office Phone: [phone]    Jonathan [name] 06/25/2021, 9:59 AM   Charlies Constable, MD Cell [phone]  Contact information:   OMBTDHRC 7am-5pm epic message Dr. Zachery Dakins, or call office for patient follow up: [phone] After hours and holidays please check [email or web address] for group call information for [facility]

## 2021-06-25 NOTE — Progress Notes (Signed)
Pt to d/c from rehab this am to proceed to [facility] for HD treatment at clinic. Discussed with pt's wife and team yesterday. Spoke to renal PA this am as well. Spoke to Causey, Agricultural consultant, at clinic to make her aware that pt will be at clinic as close to 11:30 as possible but may be a little late. Cathy agreeable.   Melven Sartorius Renal Navigator [phone]

## 2021-06-25 NOTE — Progress Notes (Signed)
Patient finished Golytley jug during shift. Patient has had two large loose bowel movements without blood.

## 2021-06-25 NOTE — Progress Notes (Signed)
STAT blood work has been drawn and awaiting results.

## 2021-06-25 NOTE — Progress Notes (Signed)
Pt d/c to home with wife and personal belongings, discharge instructions provided to them by P.Love PA-c.  Wife verbalized an understanding of instructions, medications, and follow up appts. Pt to go directly to dialysis from discharge, wife aware.

## 2021-06-25 NOTE — Progress Notes (Signed)
[facility] Progress Note   Subjective:  Seen in room. No complaints this morning. Getting blood drawn. Secure message sent to rehab MD and PD to see if able to d/c this morning for OP HD - awaiting decision.  Objective Vitals:   06/24/21 1442 06/24/21 2013 06/25/21 0504 06/25/21 0526  BP: 132/70 139/70 (!) 144/81   Pulse: 92 94 90   Resp: 18 18 17    Temp: 98.4 F (36.9 C) 97.6 F (36.4 C) 98.4 F (36.9 C)   TempSrc: Oral Oral Oral   SpO2: 95% 97% 94%   Weight:    63 kg  Height:       Physical Exam General: Well appearing man, NAD. Room air Heart: RRR; no murmur Lungs: CTA anteriorly Abdomen: soft Extremities: 1+ LLE edema, R BKA, 1+ RUE edema Dialysis Access: RUE AVF + bruit  Additional Objective Labs: CBC: Recent Labs  Lab 06/23/21 1200 06/23/21 2051 06/24/21 0045 06/24/21 0546 06/24/21 1209  WBC 9.2 8.6 7.2 8.9 7.2  HGB 8.3* 9.5* 8.6* 8.8* 8.8*  HCT 25.7* 29.5* 27.0* 27.5* 28.1*  MCV 95.5 95.2 94.7 94.5 95.6  PLT 295 296 284 305 291   Medications:   acidophilus  2 capsule Oral TID   allopurinol  100 mg Oral QHS   apixaban  5 mg Oral BID   aspirin EC  81 mg Oral Daily   Chlorhexidine Gluconate Cloth  6 each Topical Q0600   darbepoetin (ARANESP) injection - DIALYSIS  150 mcg Intravenous Q Mon-HD   diphenoxylate-atropine  1 tablet Oral QHS   feeding supplement (NEPRO CARB STEADY)  237 mL Oral QHS   folic acid  1 mg Oral Daily   Gerhardt's butt cream   Topical QID   HYDROcodone-acetaminophen  1 tablet Oral Q breakfast   hydrocortisone  25 mg Rectal BID   melatonin  5 mg Oral QHS   multivitamin  1 tablet Oral QHS   nystatin   Topical TID   nystatin cream   Topical BID   predniSONE  5 mg Oral Q breakfast   sevelamer carbonate  800 mg Oral TID WC    Dialysis Orders: Center: [facility]  on MWF . 180NRe, 4 hour 42min, BFR 400, DFR Auto 1.5, EDW 61.7kg, 2K, 2Ca, AVF 15g Heparin 3000 unit bolus  Calcitriol 1.6mcg PO q HD   Assessment/Plan: 1.  L femoral neck frx: s/p fixation per ortho on 10/28. In CIR.  2. ESRD - on HD MWF, due for HD today. Was hoping for d/c this AM to go to OP HD, but may need to run him here. 3. RUE/AVF swelling - IR consulted to eval.  Fistulogram on 11/1 which was unrevealing with no intervention needed. Advised to elevate.  No issues with cannulation.  Believe it is due to volume overload, continue to use max UF as tolerated.  3. Diarrhea/Constipation/Bloody stool - diarrhea suspected to be due to constipation.  Bloody stool noted following enema, no additional bleeding.  Renvela held to see if improved, will restart.  GI recommending daily miralax.    3. Anemia of CKD- Hgb 8.8 today, s/p 2 units pRBC on 10/31, 1 unit on 11/2.  Increase aranesp to 177mcg qwk. 4. Secondary hyperparathyroidism - Ca and phos in goal.  Renvela restarted.  5. HTN/volume - BP mildly elevated.  Closer to EDW if weights correct.  Edema in upper and lower extremities still present but improving. Plan for increase UF with HD tomorrow. May need to lower dry.  6. Nutrition - Renal diet w/fluid restrictions.   8. CAD/A fib/Hx LV thrombus - cardio consulted and cleared for surgery.  9. 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 Dementia - per primary    Veneta Penton, PA-C 06/25/2021, 9:42 AM  [facility]

## 2021-06-26 ENCOUNTER — Telehealth: Payer: Self-pay | Admitting: Nephrology

## 2021-06-26 NOTE — Telephone Encounter (Signed)
Transition of Care Contact from [place]   Date of Discharge: 06/25/21 Date of Contact: 06/26/21 Method of contact: phone Talked to patient's wife Jonathan [name]   Patient contacted to discuss transition of care form recent hospitaliztion. Patient was admitted to [facility] from 11/3 to 11/18 with the discharge diagnosis of Debility associated with L femoral fracture s/p fixation on 06/04/21.     Medication changes were reviewed.   Patient will follow up with is outpatient dialysis center 06/28/21.   Other follow up needs include none identified.   Jen Mow, PA-C [facility] Pager: [phone]

## 2021-06-29 ENCOUNTER — Encounter: Payer: Medicare Other | Admitting: Physical Therapy

## 2021-07-01 DIAGNOSIS — F518 Other sleep disorders not due to a substance or known physiological condition: Secondary | ICD-10-CM

## 2021-07-01 DIAGNOSIS — G4720 Circadian rhythm sleep disorder, unspecified type: Secondary | ICD-10-CM

## 2021-07-01 DIAGNOSIS — G8918 Other acute postprocedural pain: Secondary | ICD-10-CM

## 2021-07-01 DIAGNOSIS — K5900 Constipation, unspecified: Secondary | ICD-10-CM

## 2021-07-13 ENCOUNTER — Other Ambulatory Visit: Payer: Self-pay

## 2021-07-13 ENCOUNTER — Ambulatory Visit (INDEPENDENT_AMBULATORY_CARE_PROVIDER_SITE_OTHER): Payer: Medicare Other | Admitting: Podiatry

## 2021-07-13 DIAGNOSIS — M79675 Pain in left toe(s): Secondary | ICD-10-CM

## 2021-07-13 DIAGNOSIS — I739 Peripheral vascular disease, unspecified: Secondary | ICD-10-CM | POA: Diagnosis not present

## 2021-07-13 DIAGNOSIS — L89626 Pressure-induced deep tissue damage of left heel: Secondary | ICD-10-CM | POA: Diagnosis not present

## 2021-07-13 DIAGNOSIS — M79674 Pain in right toe(s): Secondary | ICD-10-CM

## 2021-07-13 DIAGNOSIS — B351 Tinea unguium: Secondary | ICD-10-CM | POA: Diagnosis not present

## 2021-07-14 ENCOUNTER — Encounter: Payer: Self-pay | Admitting: Podiatry

## 2021-07-14 NOTE — Progress Notes (Signed)
  Subjective:  Patient ID: Jonathan [name]., male    DOB: Dec 08, 1950,  MRN: [id number]  Chief Complaint  Patient presents with   Diabetes    Nail trim needed to the left foot. Patient has right BKA learning to use prostatic.     70 y.o. male presents with the above complaint. History confirmed with patient.  He developed an ulceration earlier this year and had a below-knee amputation.  He is now getting his prosthetic fitted.  Nails are thickened elongated and painful.  Concern from his wife that there is also a new wound developing on the back of the left heel  Objective:  Physical Exam: Status post right BKA, left foot has thin shiny atrophic skin, nonpalpable pulses, cool to touch.  Thickened elongated brown discolored toenails x5.  Deep pressure injury of the posterior heel noted in photo below     Assessment:   1. Pressure injury of deep tissue of left heel   2. PAD (peripheral artery disease) ([name])   3. Pain due to onychomycosis of toenails of both feet      Plan:  Patient was evaluated and treated and all questions answered.  Discussed the etiology and treatment options for the condition in detail with the patient. Educated patient on the topical and oral treatment options for mycotic nails. Recommended debridement of the nails today. Sharp and mechanical debridement performed of all painful and mycotic nails today. Nails debrided in length and thickness using a nail nipper to level of comfort. Discussed treatment options including appropriate shoe gear. Follow up as needed for painful nails.   Discussed with patient and his wife that is a deep tissue injury likely resulting from pressure, recommend offloading at all times with blue offloading boots which we dispensed.  Continue to monitor and surveilled the heel it may develop into a full ulceration.  Discussed the possibility of amputation with him if this happens.  His pulses are nonpalpable, his last ABIs showed poor flow  in the left but not as bad as the right was at the time.  The order new ABIs for the left side and we will message Dr. Stanford Breed about him  No follow-ups on file.

## 2021-07-20 ENCOUNTER — Ambulatory Visit: Payer: Medicare Other | Admitting: Vascular Surgery

## 2021-07-20 ENCOUNTER — Telehealth: Payer: Self-pay

## 2021-07-20 ENCOUNTER — Other Ambulatory Visit: Payer: Self-pay

## 2021-07-20 ENCOUNTER — Encounter: Payer: Self-pay | Admitting: Vascular Surgery

## 2021-07-20 VITALS — BP 143/78 | Temp 98.0°F | Resp 20 | Ht 69.0 in | Wt 138.0 lb

## 2021-07-20 DIAGNOSIS — I70244 Atherosclerosis of native arteries of left leg with ulceration of heel and midfoot: Secondary | ICD-10-CM | POA: Diagnosis not present

## 2021-07-20 NOTE — Progress Notes (Signed)
[facility] [place]  ASSESSMENT / PLAN: Jonathan [name]. is a 70 y.o. 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  He has deteriorated significantly since I saw him last in September.  I offered him palliative below-knee amputation versus transition to hospice care.  I do not think he is a limb salvage candidate.  He and his wife would like to think about their options and will call us to arrange hospice or below-knee amputation.  CHIEF COMPLAINT: status post right below knee amputation  HISTORY OF PRESENT ILLNESS: Jonathan [name]. is a 70 y.o. male who I know very well who returns for a wound check. Over the winter of 2021-2022, we tried to salvage his right foot. Unfortunately, despite successful revascularization, he required a right below knee amputation in March 2022.   07/20/2021: Mr. Hopes is taking an unfortunate turn.  He fell and broke his hip requiring knee arthroplasty by Dr. Zachery Dakins 06/04/2021.  Slow recovery since.  He has lost much of his mobility.  He is eating less and appears malnourished.  He developed a left heel ulcer over the past week which is grown to the size of a half dollar.  VASCULAR SURGICAL HISTORY:  08/05/20: R SFA stenting 08/07/20: R iliofemoral endarterectomy and fifth toe ray amputation 09/09/20: R external iliac stenting and popliteal angioplasty 10/08/20: debridement of right fifth toe amputation ulceration 11/02/20: R below knee amputation  Past Medical History:  Diagnosis Date   CAD (coronary artery disease)    CHF (congestive heart failure) ([name])    Diabetes mellitus without complication ([place])    type 2   DM (diabetes mellitus) ([place])    ESRD (end stage renal disease) ([place])    FUO (fever of unknown origin) 09/17/2019   Hyperlipidemia    Hypertension    Osteomyelitis ([place])    Peripheral arterial disease ([place])    PVD (peripheral  vascular disease) ([place])    Renal disorder 2015   right kidney transplant   Stroke [facility])    Vascular dementia [facility]     Past Surgical History:  Procedure Laterality Date   ABDOMINAL AORTOGRAM N/A 08/05/2020   Procedure: ABDOMINAL AORTOGRAM;  Surgeon: [name], MD;  Location: [place] [facility];  Service: Cardiovascular;  Laterality: N/A;   ABDOMINAL AORTOGRAM W/LOWER EXTREMITY N/A 09/09/2020   Procedure: ABDOMINAL AORTOGRAM W/LOWER EXTREMITY;  Surgeon: [name], MD;  Location: [place] [facility];  Service: Cardiovascular;  Laterality: N/A;   AMPUTATION Right 08/06/2020   Procedure: Right Fifth Toe Ray Amputation;  Surgeon: [name], MD;  Location: [place];  Service: Vascular;  Laterality: Right;   AMPUTATION Right 11/02/2020   Procedure: RIGHT AMPUTATION BELOW KNEE;  Surgeon: [name], MD;  Location: [place];  Service: Vascular;  Laterality: Right;   BACK SURGERY     Has had 2 back surgeries   BUBBLE STUDY  09/18/2019   Procedure: BUBBLE STUDY;  Surgeon: Buford Dresser, MD;  Location: [place];  Service: Cardiovascular;;   Depression     ENDARTERECTOMY FEMORAL Right 08/06/2020   Procedure: Right Ilio- Femoral Artery Endarterectomy;  Surgeon: [name], MD;  Location: [facility] OR;  Service: Vascular;  Laterality: Right;   HAND SURGERY Right    HAND SURGERY     HIP ARTHROPLASTY Left 06/04/2021   Procedure: ARTHROPLASTY BIPOLAR HIP (HEMIARTHROPLASTY);  Surgeon: Willaim Sheng, MD;  Location: [place];  Service: Orthopedics;  Laterality: Left;   IR DIALY SHUNT INTRO NEEDLE/INTRACATH INITIAL W/IMG RIGHT Right 06/07/2021   IR REMOVAL TUN CV CATH W/O FL  09/03/2019   KIDNEY TRANSPLANT     November 2015   LEG AMPUTATION BELOW KNEE Right    LOWER EXTREMITY ANGIOGRAPHY Bilateral 08/05/2020   Procedure: LOWER EXTREMITY ANGIOGRAPHY;  Surgeon: [name], MD;  Location: [place] [facility];  Service: Cardiovascular;  Laterality: Bilateral;    Memory loss     NEPHRECTOMY TRANSPLANTED ORGAN     PATCH ANGIOPLASTY Right 08/06/2020   Procedure: PATCH ANGIOPLASTY;  Surgeon: [name], MD;  Location: [facility] OR;  Service: Vascular;  Laterality: Right;   PERIPHERAL VASCULAR BALLOON ANGIOPLASTY Right 09/09/2020   Procedure: PERIPHERAL VASCULAR BALLOON ANGIOPLASTY;  Surgeon: [name], MD;  Location: [place] [facility];  Service: Cardiovascular;  Laterality: Right;  popliteal   PERIPHERAL VASCULAR INTERVENTION Right 08/05/2020   Procedure: PERIPHERAL VASCULAR INTERVENTION;  Surgeon: [name], MD;  Location: [place] [facility];  Service: Cardiovascular;  Laterality: Right;  SFA   PERIPHERAL VASCULAR INTERVENTION Right 09/09/2020   Procedure: PERIPHERAL VASCULAR INTERVENTION;  Surgeon: [name], MD;  Location: [place] [facility];  Service: Cardiovascular;  Laterality: Right;  external iliac   TEE WITHOUT CARDIOVERSION N/A 09/18/2019   Procedure: TRANSESOPHAGEAL ECHOCARDIOGRAM (TEE);  Surgeon: Buford Dresser, MD;  Location: [facility] ENDOSCOPY;  Service: Cardiovascular;  Laterality: N/A;   WOUND DEBRIDEMENT Right 10/08/2020   Procedure: DEBRIDEMENT WOUND RIGHT FOOT;  Surgeon: [name], MD;  Location: [facility] OR;  Service: Vascular;  Laterality: Right;    Family History  Problem Relation Age of Onset   Diabetes Mellitus II Mother    Cancer Mother        STOMACH   Diabetes Mellitus II Brother    Other Father        UNKOWN   Healthy Daughter    Diabetes Son     Social History   Socioeconomic History   Marital status: Married    Spouse name: [name]   Number of children: Not on file   Years of education: Not on file   Highest education level: Not on file  Occupational History   Not on file  Tobacco Use   Smoking status: Former   Smokeless tobacco: Never  Scientific laboratory technician Use: Never used  Substance and Sexual Activity   Alcohol use: Never   Drug use: Never   Sexual activity: Not on file  Other  Topics Concern   Not on file  Social History Narrative   Lives with wife, son, daughter in Sports coach and grandson   Right Handed   Drinks 1 cup caffeine daily   Social Determinants of Health   Financial Resource Strain: Not on file  Food Insecurity: Not on file  Transportation Needs: Not on file  Physical Activity: Not on file  Stress: Not on file  Social Connections: Not on file  Intimate Partner Violence: Not on file    No Known Allergies  Current Outpatient Medications  Medication Sig Dispense Refill   acidophilus (RISAQUAD) CAPS capsule Take 2 capsules by mouth 3 (three) times daily. 90 capsule 0   allopurinol (ZYLOPRIM) 100 MG tablet Take 100 mg by mouth at bedtime.     apixaban (ELIQUIS) 5 MG TABS tablet Take 1 tablet (5 mg total) by mouth 2 (two) times daily. 60 tablet 0   aspirin 81 MG chewable  tablet Chew 81 mg by mouth daily.      atorvastatin (LIPITOR) 40 MG tablet Take 40 mg by mouth at bedtime.     b complex-vitamin c-folic acid (NEPHRO-VITE) 0.8 MG TABS tablet Take 1 tablet by mouth daily.     diphenoxylate-atropine (LOMOTIL) 2.5-0.025 MG tablet Take 1 tablet by mouth at bedtime. 30 tablet 0   feeding supplement (BOOST HIGH PROTEIN) LIQD Take 1 Container by mouth daily as needed (nutrition).     folic acid (FOLVITE) 1 MG tablet Take 1 tablet (1 mg total) by mouth daily. 30 tablet 3   HYDROcodone-acetaminophen (NORCO/VICODIN) 5-325 MG tablet Take 1 tablet by mouth 2 (two) times daily as needed for moderate pain. 14 tablet 0   hydrocortisone (ANUSOL-HC) 25 MG suppository Place 1 suppository (25 mg total) rectally 2 (two) times daily. 14 suppository 0   insulin aspart (NOVOLOG) 100 UNIT/ML injection Inject 0-9 Units into the skin as needed for high blood sugar. 10 mL 11   Insulin Pen Needle 32G X 4 MM MISC 1 each by Other route as needed (insulin).      levalbuterol (XOPENEX HFA) 45 MCG/ACT inhaler Inhale 2 puffs into the lungs every 8 (eight) hours as needed for wheezing. 1  Inhaler 12   lidocaine-prilocaine (EMLA) cream Apply 1 application topically 3 (three) times a week. 30 minutes prior to Dialysis - MWF  11   melatonin 5 MG TABS Take 1 tablet (5 mg total) by mouth at bedtime. 30 tablet 0   Nystatin (GERHARDT'S BUTT CREAM) CREA Apply 1 application topically 4 (four) times daily. Mix desitin with antifugal cream and apply a layer to buttocks/scrotum.     nystatin (MYCOSTATIN/NYSTOP) powder Apply topically 3 (three) times daily. 60 g 0   nystatin cream (MYCOSTATIN) Apply topically 2 (two) times daily. 30 g 0   polyethylene glycol (MIRALAX / GLYCOLAX) 17 g packet Take 17 g by mouth daily as needed for mild constipation. 14 each 0   predniSONE (DELTASONE) 5 MG tablet Take 5 mg by mouth daily with breakfast.     sevelamer carbonate (RENVELA) 800 MG tablet Take 1 tablet (800 mg total) by mouth 3 (three) times daily with meals. 90 tablet 0   witch hazel-glycerin (TUCKS) pad Apply topically as needed for itching. 40 each 12   No current facility-administered medications for this visit.    REVIEW OF SYSTEMS:  [X]  denotes positive finding, [ ]  denotes negative finding Cardiac  Comments:  Chest pain or chest pressure:    Shortness of breath upon exertion:    Short of breath when lying flat:    Irregular heart rhythm:        Vascular    Pain in calf, thigh, or hip brought on by ambulation:    Pain in feet at night that wakes you up from your sleep:     Blood clot in your veins:    Leg swelling:         Pulmonary    Oxygen at home:    Productive cough:     Wheezing:         Neurologic    Sudden weakness in arms or legs:     Sudden numbness in arms or legs:     Sudden onset of difficulty speaking or slurred speech:    Temporary loss of vision in one eye:     Problems with dizziness:         Gastrointestinal    Blood in stool:  Vomited blood:         Genitourinary    Burning when urinating:     Blood in urine:        Psychiatric    Major  depression:         Hematologic    Bleeding problems:    Problems with blood clotting too easily:        Skin    Rashes or ulcers:        Constitutional    Fever or chills:      PHYSICAL EXAM  Vitals:   07/20/21 1520  BP: (!) 143/78  Resp: 20  Temp: 98 F (36.7 C)  SpO2: 95%  Weight: 138 lb (62.6 kg)  Height: 5\' 9"  (1.753 m)     Constitutional: chronically ill appearing. no distress. Appears well nourished.  Neurologic: CN intact. Unchanged expressive aphasia Psychiatric: Mood and affect symmetric and appropriate. Eyes: No icterus. No conjunctival pallor. Ears, nose, throat: mucous membranes moist. Midline trachea.  Cardiac: regular rate and rhythm.  Respiratory: unlabored. Abdominal: soft, non-tender, non-distended.  Peripheral vascular:  R BKA healed  Left heel unstageable ulcer with black eschar  Brisk left posterior tibial Doppler signal No left pedal pulses Extremity: No edema. No cyanosis. No pallor.  Skin: No gangrene. No ulceration.  As above Lymphatic: No Stemmer's sign. No palpable lymphadenopathy.  PERTINENT LABORATORY AND RADIOLOGIC DATA  Most recent CBC CBC Latest Ref Rng & Units 06/25/2021 06/24/2021 06/24/2021  WBC 4.0 - 10.5 K/uL 7.2 7.2 8.9  Hemoglobin 13.0 - 17.0 g/dL 9.6(L) 8.8(L) 8.8(L)  Hematocrit 39.0 - 52.0 % 30.7(L) 28.1(L) 27.5(L)  Platelets 150 - 400 K/uL 282 291 305     Most recent CMP CMP Latest Ref Rng & Units 06/16/2021 06/14/2021 06/11/2021  Glucose 70 - 99 mg/dL 135(H) 95 67(L)  BUN 8 - 23 mg/dL 76(H) 98(H) 76(H)  Creatinine 0.61 - 1.24 mg/dL 7.37(H) 8.98(H) 7.58(H)  Sodium 135 - 145 mmol/L 131(L) 130(L) 137  Potassium 3.5 - 5.1 mmol/L 4.8 5.8(H) 5.2(H)  Chloride 98 - 111 mmol/L 92(L) 94(L) 97(L)  CO2 22 - 32 mmol/L 25 24 29   Calcium 8.9 - 10.3 mg/dL 8.5(L) 8.6(L) 9.0  Total Protein 6.5 - 8.1 g/dL - - -  Total Bilirubin 0.3 - 1.2 mg/dL - - -  Alkaline Phos 38 - 126 U/L - - -  AST 15 - 41 U/L - - -  ALT 0 - 44 U/L - - -     Renal function CrCl cannot be calculated (Patient's most recent lab result is older than the maximum 21 days allowed.).  Hgb A1c MFr Bld (%)  Date Value  06/04/2021 5.3    LDL Cholesterol  Date Value Ref Range Status  08/07/2020 43 0 - 99 mg/dL Final    Comment:           Total Cholesterol/HDL:CHD Risk Coronary Heart Disease Risk Table                     Men   Women  1/2 Average Risk   3.4   3.3  Average Risk       5.0   4.4  2 X Average Risk   9.6   7.1  3 X Average Risk  23.4   11.0        Use the calculated Patient Ratio above and the CHD Risk Table to determine the patient's CHD Risk.        ATP  III CLASSIFICATION (LDL):  <100     mg/dL   Optimal  100-129  mg/dL   Near or Above                    Optimal  130-159  mg/dL   Borderline  160-189  mg/dL   High  >190     mg/dL   Very High Performed at [facility] [address], [place], [place] [address]     [name], MD [facility] Phone Number: [phone] 07/20/2021 4:14 PM

## 2021-07-20 NOTE — Telephone Encounter (Signed)
Patient's wife calls today to report that patient broke his left hip about a month ago. Last week he had developed some discoloration on his left heel and in the last few days it has progressed to a quarter-size black spot. It is dry and the patient is not complaining of pain. She wants to take patient to the ER. Advised they would be better served to come to the office. They have transportation set up today to take them to the hip MD and are going to come to [facility] afterwards. ABI scheduled for Thursday. MD aware.

## 2021-07-22 ENCOUNTER — Ambulatory Visit (HOSPITAL_COMMUNITY): Payer: Medicare Other | Attending: Podiatry

## 2021-08-03 ENCOUNTER — Ambulatory Visit: Payer: Medicare Other | Admitting: Vascular Surgery

## 2021-08-03 ENCOUNTER — Encounter: Payer: Self-pay | Admitting: Vascular Surgery

## 2021-08-03 ENCOUNTER — Other Ambulatory Visit: Payer: Self-pay

## 2021-08-03 VITALS — BP 140/68 | HR 70 | Temp 98.0°F | Resp 20 | Ht 69.0 in | Wt 138.0 lb

## 2021-08-03 DIAGNOSIS — I739 Peripheral vascular disease, unspecified: Secondary | ICD-10-CM

## 2021-08-03 NOTE — Progress Notes (Signed)
[facility] [place]  ASSESSMENT / PLAN: Jonathan [name]. is a 70 y.o. 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  He has deteriorated significantly since I saw him last in September.  He would like to proceed with home hospice care.  My office will help arrange this.  CHIEF COMPLAINT: status post right below knee amputation  HISTORY OF PRESENT ILLNESS: Jonathan [name]. is a 70 y.o. male who I know very well who returns for a wound check. Over the winter of 2021-2022, we tried to salvage his right foot. Unfortunately, despite successful revascularization, he required a right below knee amputation in March 2022.   07/20/2021: Jonathan [name] is taking an unfortunate turn.  He fell and broke his hip requiring knee arthroplasty by Dr. Zachery Dakins 06/04/2021.  Slow recovery since.  He has lost much of his mobility.  He is eating less and appears malnourished.  He developed a left heel ulcer over the past week which is grown to the size of a half dollar.  08/03/21: [name] continues to do poorly.  His left heel ulcer continues to worsen despite good local care by his wife.  They would like to pursue hospice measures.  VASCULAR SURGICAL HISTORY:  08/05/20: R SFA stenting 08/07/20: R iliofemoral endarterectomy and fifth toe ray amputation 09/09/20: R external iliac stenting and popliteal angioplasty 10/08/20: debridement of right fifth toe amputation ulceration 11/02/20: R below knee amputation  Past Medical History:  Diagnosis Date   CAD (coronary artery disease)    CHF (congestive heart failure) ([name])    Diabetes mellitus without complication ([place])    type 2   DM (diabetes mellitus) ([place])    ESRD (end stage renal disease) ([place])    FUO (fever of unknown origin) 09/17/2019   Hyperlipidemia    Hypertension    Osteomyelitis ([place])    Peripheral arterial disease ([place])    PVD  (peripheral vascular disease) ([place])    Renal disorder 2015   right kidney transplant   Stroke [facility])    Vascular dementia [facility])     Past Surgical History:  Procedure Laterality Date   ABDOMINAL AORTOGRAM N/A 08/05/2020   Procedure: ABDOMINAL AORTOGRAM;  Surgeon: [name], MD;  Location: [place] CV LAB;  Service: Cardiovascular;  Laterality: N/A;   ABDOMINAL AORTOGRAM W/LOWER EXTREMITY N/A 09/09/2020   Procedure: ABDOMINAL AORTOGRAM W/LOWER EXTREMITY;  Surgeon: [name], MD;  Location: [place] CV LAB;  Service: Cardiovascular;  Laterality: N/A;   AMPUTATION Right 08/06/2020   Procedure: Right Fifth Toe Ray Amputation;  Surgeon: [name], MD;  Location: [place];  Service: Vascular;  Laterality: Right;   AMPUTATION Right 11/02/2020   Procedure: RIGHT AMPUTATION BELOW KNEE;  Surgeon: [name], MD;  Location: [place];  Service: Vascular;  Laterality: Right;   BACK SURGERY     Has had 2 back surgeries   BUBBLE STUDY  09/18/2019   Procedure: BUBBLE STUDY;  Surgeon: Buford Dresser, MD;  Location: [place];  Service: Cardiovascular;;   Depression     ENDARTERECTOMY FEMORAL Right 08/06/2020   Procedure: Right Ilio- Femoral Artery Endarterectomy;  Surgeon: [name], MD;  Location[facility] OR;  Service: Vascular;  Laterality: Right;   HAND SURGERY Right    HAND SURGERY     HIP ARTHROPLASTY Left 06/04/2021   Procedure: ARTHROPLASTY BIPOLAR HIP (HEMIARTHROPLASTY);  Surgeon: Charlies Constable  A, MD;  Location: [place];  Service: Orthopedics;  Laterality: Left;   IR DIALY SHUNT INTRO NEEDLE/INTRACATH INITIAL W/IMG RIGHT Right 06/07/2021   IR REMOVAL TUN CV CATH W/O FL  09/03/2019   KIDNEY TRANSPLANT     November 2015   LEG AMPUTATION BELOW KNEE Right    LOWER EXTREMITY ANGIOGRAPHY Bilateral 08/05/2020   Procedure: LOWER EXTREMITY ANGIOGRAPHY;  Surgeon: [name], MD;  Location: [place] CV LAB;  Service: Cardiovascular;  Laterality:  Bilateral;   Memory loss     NEPHRECTOMY TRANSPLANTED ORGAN     PATCH ANGIOPLASTY Right 08/06/2020   Procedure: PATCH ANGIOPLASTY;  Surgeon: [name], MD;  Location: [facility] OR;  Service: Vascular;  Laterality: Right;   PERIPHERAL VASCULAR BALLOON ANGIOPLASTY Right 09/09/2020   Procedure: PERIPHERAL VASCULAR BALLOON ANGIOPLASTY;  Surgeon: [name], MD;  Location: [place] CV LAB;  Service: Cardiovascular;  Laterality: Right;  popliteal   PERIPHERAL VASCULAR INTERVENTION Right 08/05/2020   Procedure: PERIPHERAL VASCULAR INTERVENTION;  Surgeon: [name], MD;  Location: [place] CV LAB;  Service: Cardiovascular;  Laterality: Right;  SFA   PERIPHERAL VASCULAR INTERVENTION Right 09/09/2020   Procedure: PERIPHERAL VASCULAR INTERVENTION;  Surgeon: [name], MD;  Location: [place] CV LAB;  Service: Cardiovascular;  Laterality: Right;  external iliac   TEE WITHOUT CARDIOVERSION N/A 09/18/2019   Procedure: TRANSESOPHAGEAL ECHOCARDIOGRAM (TEE);  Surgeon: Buford Dresser, MD;  Location: [facility] ENDOSCOPY;  Service: Cardiovascular;  Laterality: N/A;   WOUND DEBRIDEMENT Right 10/08/2020   Procedure: DEBRIDEMENT WOUND RIGHT FOOT;  Surgeon: [name], MD;  Location: [facility];  Service: Vascular;  Laterality: Right;    Family History  Problem Relation Age of Onset   Diabetes Mellitus II Mother    Cancer Mother        STOMACH   Diabetes Mellitus II Brother    Other Father        UNKOWN   Healthy Daughter    Diabetes Son     Social History   Socioeconomic History   Marital status: Married    Spouse name: [name]   Number of children: Not on file   Years of education: Not on file   Highest education level: Not on file  Occupational History   Not on file  Tobacco Use   Smoking status: Former   Smokeless tobacco: Never  Scientific laboratory technician Use: Never used  Substance and Sexual Activity   Alcohol use: Never   Drug use: Never   Sexual activity: Not  on file  Other Topics Concern   Not on file  Social History Narrative   Lives with wife, son, daughter in Sports coach and grandson   Right Handed   Drinks 1 cup caffeine daily   Social Determinants of Health   Financial Resource Strain: Not on file  Food Insecurity: Not on file  Transportation Needs: Not on file  Physical Activity: Not on file  Stress: Not on file  Social Connections: Not on file  Intimate Partner Violence: Not on file    No Known Allergies  Current Outpatient Medications  Medication Sig Dispense Refill   acidophilus (RISAQUAD) CAPS capsule Take 2 capsules by mouth 3 (three) times daily. 90 capsule 0   allopurinol (ZYLOPRIM) 100 MG tablet Take 100 mg by mouth at bedtime.     apixaban (ELIQUIS) 5 MG TABS tablet Take 1 tablet (5 mg total) by mouth 2 (two) times daily. 60 tablet 0   aspirin  81 MG chewable tablet Chew 81 mg by mouth daily.      atorvastatin (LIPITOR) 40 MG tablet Take 40 mg by mouth at bedtime.     b complex-vitamin c-folic acid (NEPHRO-VITE) 0.8 MG TABS tablet Take 1 tablet by mouth daily.     diphenoxylate-atropine (LOMOTIL) 2.5-0.025 MG tablet Take 1 tablet by mouth at bedtime. 30 tablet 0   feeding supplement (BOOST HIGH PROTEIN) LIQD Take 1 Container by mouth daily as needed (nutrition).     folic acid (FOLVITE) 1 MG tablet Take 1 tablet (1 mg total) by mouth daily. 30 tablet 3   HYDROcodone-acetaminophen (NORCO/VICODIN) 5-325 MG tablet Take 1 tablet by mouth 2 (two) times daily as needed for moderate pain. 14 tablet 0   hydrocortisone (ANUSOL-HC) 25 MG suppository Place 1 suppository (25 mg total) rectally 2 (two) times daily. 14 suppository 0   insulin aspart (NOVOLOG) 100 UNIT/ML injection Inject 0-9 Units into the skin as needed for high blood sugar. 10 mL 11   Insulin Pen Needle 32G X 4 MM MISC 1 each by Other route as needed (insulin).      levalbuterol (XOPENEX HFA) 45 MCG/ACT inhaler Inhale 2 puffs into the lungs every 8 (eight) hours as needed  for wheezing. 1 Inhaler 12   lidocaine-prilocaine (EMLA) cream Apply 1 application topically 3 (three) times a week. 30 minutes prior to Dialysis - MWF  11   melatonin 5 MG TABS Take 1 tablet (5 mg total) by mouth at bedtime. 30 tablet 0   Nystatin (GERHARDT'S BUTT CREAM) CREA Apply 1 application topically 4 (four) times daily. Mix desitin with antifugal cream and apply a layer to buttocks/scrotum.     nystatin (MYCOSTATIN/NYSTOP) powder Apply topically 3 (three) times daily. 60 g 0   nystatin cream (MYCOSTATIN) Apply topically 2 (two) times daily. 30 g 0   polyethylene glycol (MIRALAX / GLYCOLAX) 17 g packet Take 17 g by mouth daily as needed for mild constipation. 14 each 0   predniSONE (DELTASONE) 5 MG tablet Take 5 mg by mouth daily with breakfast.     sevelamer carbonate (RENVELA) 800 MG tablet Take 1 tablet (800 mg total) by mouth 3 (three) times daily with meals. 90 tablet 0   witch hazel-glycerin (TUCKS) pad Apply topically as needed for itching. 40 each 12   No current facility-administered medications for this visit.    REVIEW OF SYSTEMS:  [X]  denotes positive finding, [ ]  denotes negative finding Cardiac  Comments:  Chest pain or chest pressure:    Shortness of breath upon exertion:    Short of breath when lying flat:    Irregular heart rhythm:        Vascular    Pain in calf, thigh, or hip brought on by ambulation:    Pain in feet at night that wakes you up from your sleep:     Blood clot in your veins:    Leg swelling:         Pulmonary    Oxygen at home:    Productive cough:     Wheezing:         Neurologic    Sudden weakness in arms or legs:     Sudden numbness in arms or legs:     Sudden onset of difficulty speaking or slurred speech:    Temporary loss of vision in one eye:     Problems with dizziness:         Gastrointestinal    Blood  in stool:     Vomited blood:         Genitourinary    Burning when urinating:     Blood in urine:        Psychiatric     Major depression:         Hematologic    Bleeding problems:    Problems with blood clotting too easily:        Skin    Rashes or ulcers:        Constitutional    Fever or chills:      PHYSICAL EXAM  Vitals:   08/03/21 1510  BP: 140/68  Pulse: 70  Resp: 20  Temp: 98 F (36.7 C)  SpO2: 94%  Weight: 138 lb (62.6 kg)  Height: 5\' 9"  (1.753 m)     Constitutional: chronically ill appearing. no distress. Appears well nourished.  Neurologic: CN intact. Unchanged expressive aphasia Psychiatric: Mood and affect symmetric and appropriate. Eyes: No icterus. No conjunctival pallor. Ears, nose, throat: mucous membranes moist. Midline trachea.  Cardiac: regular rate and rhythm.  Respiratory: unlabored. Abdominal: soft, non-tender, non-distended.  Peripheral vascular:  R BKA healed  Left heel unstageable ulcer with black eschar  Brisk left posterior tibial Doppler signal No left pedal pulses Extremity: No edema. No cyanosis. No pallor.  Skin: No gangrene. No ulceration.  As above Lymphatic: No Stemmer's sign. No palpable lymphadenopathy.  PERTINENT LABORATORY AND RADIOLOGIC DATA  Most recent CBC CBC Latest Ref Rng & Units 06/25/2021 06/24/2021 06/24/2021  WBC 4.0 - 10.5 K/uL 7.2 7.2 8.9  Hemoglobin 13.0 - 17.0 g/dL 9.6(L) 8.8(L) 8.8(L)  Hematocrit 39.0 - 52.0 % 30.7(L) 28.1(L) 27.5(L)  Platelets 150 - 400 K/uL 282 291 305     Most recent CMP CMP Latest Ref Rng & Units 06/16/2021 06/14/2021 06/11/2021  Glucose 70 - 99 mg/dL 135(H) 95 67(L)  BUN 8 - 23 mg/dL 76(H) 98(H) 76(H)  Creatinine 0.61 - 1.24 mg/dL 7.37(H) 8.98(H) 7.58(H)  Sodium 135 - 145 mmol/L 131(L) 130(L) 137  Potassium 3.5 - 5.1 mmol/L 4.8 5.8(H) 5.2(H)  Chloride 98 - 111 mmol/L 92(L) 94(L) 97(L)  CO2 22 - 32 mmol/L 25 24 29   Calcium 8.9 - 10.3 mg/dL 8.5(L) 8.6(L) 9.0  Total Protein 6.5 - 8.1 g/dL - - -  Total Bilirubin 0.3 - 1.2 mg/dL - - -  Alkaline Phos 38 - 126 U/L - - -  AST 15 - 41 U/L - - -  ALT 0  - 44 U/L - - -    Renal function CrCl cannot be calculated (Patient's most recent lab result is older than the maximum 21 days allowed.).  Hgb A1c MFr Bld (%)  Date Value  06/04/2021 5.3    LDL Cholesterol  Date Value Ref Range Status  08/07/2020 43 0 - 99 mg/dL Final    Comment:           Total Cholesterol/HDL:CHD Risk Coronary Heart Disease Risk Table                     Men   Women  1/2 Average Risk   3.4   3.3  Average Risk       5.0   4.4  2 X Average Risk   9.6   7.1  3 X Average Risk  23.4   11.0        Use the calculated Patient Ratio above and the CHD Risk Table to determine the patient's CHD Risk.  ATP III CLASSIFICATION (LDL):  <100     mg/dL   Optimal  100-129  mg/dL   Near or Above                    Optimal  130-159  mg/dL   Borderline  160-189  mg/dL   High  >190     mg/dL   Very High Performed at [facility] [address] [place], [place] [address]     [name], MD [facility] Phone Number: [phone] 08/03/2021 5:42 PM

## 2021-08-03 NOTE — H&P (View-Only) (Signed)
VASCULAR [facility] VEIN SPECIALISTS OF [place]  ASSESSMENT / PLAN: Jonathan [name]. is a 70 y.o. 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  He has deteriorated significantly since I saw him last in September.  He would like to proceed with home hospice care.  My office will help arrange this.  CHIEF COMPLAINT: status post right below knee amputation  HISTORY OF PRESENT ILLNESS: Jonathan [name]. is a 70 y.o. male who I know very well who returns for a wound check. Over the winter of 2021-2022, we tried to salvage his right foot. Unfortunately, despite successful revascularization, he required a right below knee amputation in March 2022.   07/20/2021: [name] is taking an unfortunate turn.  He fell and broke his hip requiring knee arthroplasty by Dr. Zachery Dakins 06/04/2021.  Slow recovery since.  He has lost much of his mobility.  He is eating less and appears malnourished.  He developed a left heel ulcer over the past week which is grown to the size of a half dollar.  08/03/21: [name] continues to do poorly.  His left heel ulcer continues to worsen despite good local care by his wife.  They would like to pursue hospice measures.  VASCULAR SURGICAL HISTORY:  08/05/20: R SFA stenting 08/07/20: R iliofemoral endarterectomy and fifth toe ray amputation 09/09/20: R external iliac stenting and popliteal angioplasty 10/08/20: debridement of right fifth toe amputation ulceration 11/02/20: R below knee amputation  Past Medical History:  Diagnosis Date   CAD (coronary artery disease)    CHF (congestive heart failure) ([name])    Diabetes mellitus without complication ([place])    type 2   DM (diabetes mellitus) ([place])    ESRD (end stage renal disease) ([place])    FUO (fever of unknown origin) 09/17/2019   Hyperlipidemia    Hypertension    Osteomyelitis ([place])    Peripheral arterial disease ([place])    PVD  (peripheral vascular disease) ([place])    Renal disorder 2015   right kidney transplant   Stroke [facility])    Vascular dementia [facility])     Past Surgical History:  Procedure Laterality Date   ABDOMINAL AORTOGRAM N/A 08/05/2020   Procedure: ABDOMINAL AORTOGRAM;  Surgeon: [name], MD;  Location: [place] [facility];  Service: Cardiovascular;  Laterality: N/A;   ABDOMINAL AORTOGRAM W/LOWER EXTREMITY N/A 09/09/2020   Procedure: ABDOMINAL AORTOGRAM W/LOWER EXTREMITY;  Surgeon: [name], MD;  Location: [place] [facility];  Service: Cardiovascular;  Laterality: N/A;   AMPUTATION Right 08/06/2020   Procedure: Right Fifth Toe Ray Amputation;  Surgeon: [name], MD;  Location: [place];  Service: Vascular;  Laterality: Right;   AMPUTATION Right 11/02/2020   Procedure: RIGHT AMPUTATION BELOW KNEE;  Surgeon: [name], MD;  Location: [place];  Service: Vascular;  Laterality: Right;   BACK SURGERY     Has had 2 back surgeries   BUBBLE STUDY  09/18/2019   Procedure: BUBBLE STUDY;  Surgeon: Buford Dresser, MD;  Location: [place];  Service: Cardiovascular;;   Depression     ENDARTERECTOMY FEMORAL Right 08/06/2020   Procedure: Right Ilio- Femoral Artery Endarterectomy;  Surgeon: [name], MD;  Location: [facility] OR;  Service: Vascular;  Laterality: Right;   HAND SURGERY Right    HAND SURGERY     HIP ARTHROPLASTY Left 06/04/2021   Procedure: ARTHROPLASTY BIPOLAR HIP (HEMIARTHROPLASTY);  Surgeon: Charlies Constable  A, MD;  Location: [place];  Service: Orthopedics;  Laterality: Left;   IR DIALY SHUNT INTRO NEEDLE/INTRACATH INITIAL W/IMG RIGHT Right 06/07/2021   IR REMOVAL TUN CV CATH W/O FL  09/03/2019   KIDNEY TRANSPLANT     November 2015   LEG AMPUTATION BELOW KNEE Right    LOWER EXTREMITY ANGIOGRAPHY Bilateral 08/05/2020   Procedure: LOWER EXTREMITY ANGIOGRAPHY;  Surgeon: [name], MD;  Location: [place] [facility];  Service: Cardiovascular;  Laterality:  Bilateral;   Memory loss     NEPHRECTOMY TRANSPLANTED ORGAN     PATCH ANGIOPLASTY Right 08/06/2020   Procedure: PATCH ANGIOPLASTY;  Surgeon: [name], MD;  Location: [facility] OR;  Service: Vascular;  Laterality: Right;   PERIPHERAL VASCULAR BALLOON ANGIOPLASTY Right 09/09/2020   Procedure: PERIPHERAL VASCULAR BALLOON ANGIOPLASTY;  Surgeon: [name], MD;  Location: [place] [facility];  Service: Cardiovascular;  Laterality: Right;  popliteal   PERIPHERAL VASCULAR INTERVENTION Right 08/05/2020   Procedure: PERIPHERAL VASCULAR INTERVENTION;  Surgeon: [name], MD;  Location: [place] [facility];  Service: Cardiovascular;  Laterality: Right;  SFA   PERIPHERAL VASCULAR INTERVENTION Right 09/09/2020   Procedure: PERIPHERAL VASCULAR INTERVENTION;  Surgeon: [name], MD;  Location: [place] [facility];  Service: Cardiovascular;  Laterality: Right;  external iliac   TEE WITHOUT CARDIOVERSION N/A 09/18/2019   Procedure: TRANSESOPHAGEAL ECHOCARDIOGRAM (TEE);  Surgeon: Buford Dresser, MD;  Location: [facility] ENDOSCOPY;  Service: Cardiovascular;  Laterality: N/A;   WOUND DEBRIDEMENT Right 10/08/2020   Procedure: DEBRIDEMENT WOUND RIGHT FOOT;  Surgeon: [name], MD;  Location: [facility] OR;  Service: Vascular;  Laterality: Right;    Family History  Problem Relation Age of Onset   Diabetes Mellitus II Mother    Cancer Mother        STOMACH   Diabetes Mellitus II Brother    Other Father        UNKOWN   Healthy Daughter    Diabetes Son     Social History   Socioeconomic History   Marital status: Married    Spouse name: [name]   Number of children: Not on file   Years of education: Not on file   Highest education level: Not on file  Occupational History   Not on file  Tobacco Use   Smoking status: Former   Smokeless tobacco: Never  Scientific laboratory technician Use: Never used  Substance and Sexual Activity   Alcohol use: Never   Drug use: Never   Sexual activity: Not  on file  Other Topics Concern   Not on file  Social History Narrative   Lives with wife, son, daughter in Sports coach and grandson   Right Handed   Drinks 1 cup caffeine daily   Social Determinants of Health   Financial Resource Strain: Not on file  Food Insecurity: Not on file  Transportation Needs: Not on file  Physical Activity: Not on file  Stress: Not on file  Social Connections: Not on file  Intimate Partner Violence: Not on file    No Known Allergies  Current Outpatient Medications  Medication Sig Dispense Refill   acidophilus (RISAQUAD) CAPS capsule Take 2 capsules by mouth 3 (three) times daily. 90 capsule 0   allopurinol (ZYLOPRIM) 100 MG tablet Take 100 mg by mouth at bedtime.     apixaban (ELIQUIS) 5 MG TABS tablet Take 1 tablet (5 mg total) by mouth 2 (two) times daily. 60 tablet 0   aspirin  81 MG chewable tablet Chew 81 mg by mouth daily.      atorvastatin (LIPITOR) 40 MG tablet Take 40 mg by mouth at bedtime.     b complex-vitamin c-folic acid (NEPHRO-VITE) 0.8 MG TABS tablet Take 1 tablet by mouth daily.     diphenoxylate-atropine (LOMOTIL) 2.5-0.025 MG tablet Take 1 tablet by mouth at bedtime. 30 tablet 0   feeding supplement (BOOST HIGH PROTEIN) LIQD Take 1 Container by mouth daily as needed (nutrition).     folic acid (FOLVITE) 1 MG tablet Take 1 tablet (1 mg total) by mouth daily. 30 tablet 3   HYDROcodone-acetaminophen (NORCO/VICODIN) 5-325 MG tablet Take 1 tablet by mouth 2 (two) times daily as needed for moderate pain. 14 tablet 0   hydrocortisone (ANUSOL-HC) 25 MG suppository Place 1 suppository (25 mg total) rectally 2 (two) times daily. 14 suppository 0   insulin aspart (NOVOLOG) 100 UNIT/ML injection Inject 0-9 Units into the skin as needed for high blood sugar. 10 mL 11   Insulin Pen Needle 32G X 4 MM MISC 1 each by Other route as needed (insulin).      levalbuterol (XOPENEX HFA) 45 MCG/ACT inhaler Inhale 2 puffs into the lungs every 8 (eight) hours as needed  for wheezing. 1 Inhaler 12   lidocaine-prilocaine (EMLA) cream Apply 1 application topically 3 (three) times a week. 30 minutes prior to Dialysis - MWF  11   melatonin 5 MG TABS Take 1 tablet (5 mg total) by mouth at bedtime. 30 tablet 0   Nystatin (GERHARDT'S BUTT CREAM) CREA Apply 1 application topically 4 (four) times daily. Mix desitin with antifugal cream and apply a layer to buttocks/scrotum.     nystatin (MYCOSTATIN/NYSTOP) powder Apply topically 3 (three) times daily. 60 g 0   nystatin cream (MYCOSTATIN) Apply topically 2 (two) times daily. 30 g 0   polyethylene glycol (MIRALAX / GLYCOLAX) 17 g packet Take 17 g by mouth daily as needed for mild constipation. 14 each 0   predniSONE (DELTASONE) 5 MG tablet Take 5 mg by mouth daily with breakfast.     sevelamer carbonate (RENVELA) 800 MG tablet Take 1 tablet (800 mg total) by mouth 3 (three) times daily with meals. 90 tablet 0   witch hazel-glycerin (TUCKS) pad Apply topically as needed for itching. 40 each 12   No current facility-administered medications for this visit.    REVIEW OF SYSTEMS:  [X]  denotes positive finding, [ ]  denotes negative finding Cardiac  Comments:  Chest pain or chest pressure:    Shortness of breath upon exertion:    Short of breath when lying flat:    Irregular heart rhythm:        Vascular    Pain in calf, thigh, or hip brought on by ambulation:    Pain in feet at night that wakes you up from your sleep:     Blood clot in your veins:    Leg swelling:         Pulmonary    Oxygen at home:    Productive cough:     Wheezing:         Neurologic    Sudden weakness in arms or legs:     Sudden numbness in arms or legs:     Sudden onset of difficulty speaking or slurred speech:    Temporary loss of vision in one eye:     Problems with dizziness:         Gastrointestinal    Blood  in stool:     Vomited blood:         Genitourinary    Burning when urinating:     Blood in urine:        Psychiatric     Major depression:         Hematologic    Bleeding problems:    Problems with blood clotting too easily:        Skin    Rashes or ulcers:        Constitutional    Fever or chills:      PHYSICAL EXAM  Vitals:   08/03/21 1510  BP: 140/68  Pulse: 70  Resp: 20  Temp: 98 F (36.7 C)  SpO2: 94%  Weight: 138 lb (62.6 kg)  Height: 5\' 9"  (1.753 m)     Constitutional: chronically ill appearing. no distress. Appears well nourished.  Neurologic: CN intact. Unchanged expressive aphasia Psychiatric: Mood and affect symmetric and appropriate. Eyes: No icterus. No conjunctival pallor. Ears, nose, throat: mucous membranes moist. Midline trachea.  Cardiac: regular rate and rhythm.  Respiratory: unlabored. Abdominal: soft, non-tender, non-distended.  Peripheral vascular:  R BKA healed  Left heel unstageable ulcer with black eschar  Brisk left posterior tibial Doppler signal No left pedal pulses Extremity: No edema. No cyanosis. No pallor.  Skin: No gangrene. No ulceration.  As above Lymphatic: No Stemmer's sign. No palpable lymphadenopathy.  PERTINENT LABORATORY AND RADIOLOGIC DATA  Most recent CBC CBC Latest Ref Rng & Units 06/25/2021 06/24/2021 06/24/2021  WBC 4.0 - 10.5 K/uL 7.2 7.2 8.9  Hemoglobin 13.0 - 17.0 g/dL 9.6(L) 8.8(L) 8.8(L)  Hematocrit 39.0 - 52.0 % 30.7(L) 28.1(L) 27.5(L)  Platelets 150 - 400 K/uL 282 291 305     Most recent CMP CMP Latest Ref Rng & Units 06/16/2021 06/14/2021 06/11/2021  Glucose 70 - 99 mg/dL 135(H) 95 67(L)  BUN 8 - 23 mg/dL 76(H) 98(H) 76(H)  Creatinine 0.61 - 1.24 mg/dL 7.37(H) 8.98(H) 7.58(H)  Sodium 135 - 145 mmol/L 131(L) 130(L) 137  Potassium 3.5 - 5.1 mmol/L 4.8 5.8(H) 5.2(H)  Chloride 98 - 111 mmol/L 92(L) 94(L) 97(L)  CO2 22 - 32 mmol/L 25 24 29   Calcium 8.9 - 10.3 mg/dL 8.5(L) 8.6(L) 9.0  Total Protein 6.5 - 8.1 g/dL - - -  Total Bilirubin 0.3 - 1.2 mg/dL - - -  Alkaline Phos 38 - 126 U/L - - -  AST 15 - 41 U/L - - -  ALT 0  - 44 U/L - - -    Renal function CrCl cannot be calculated (Patient's most recent lab result is older than the maximum 21 days allowed.).  Hgb A1c MFr Bld (%)  Date Value  06/04/2021 5.3    LDL Cholesterol  Date Value Ref Range Status  08/07/2020 43 0 - 99 mg/dL Final    Comment:           Total Cholesterol/HDL:CHD Risk Coronary Heart Disease Risk Table                     Men   Women  1/2 Average Risk   3.4   3.3  Average Risk       5.0   4.4  2 X Average Risk   9.6   7.1  3 X Average Risk  23.4   11.0        Use the calculated Patient Ratio above and the CHD Risk Table to determine the patient's CHD Risk.  ATP III CLASSIFICATION (LDL):  <100     mg/dL   Optimal  100-129  mg/dL   Near or Above                    Optimal  130-159  mg/dL   Borderline  160-189  mg/dL   High  >190     mg/dL   Very High Performed at [facility] [address], [place], [place] [address]     [name], MD Vascular and Vein Specialists of [facility] Phone Number: [phone] 08/03/2021 5:42 PM

## 2021-08-06 ENCOUNTER — Telehealth: Payer: Self-pay

## 2021-08-06 NOTE — Telephone Encounter (Signed)
Patient's wife called stating patient is not at the stage of hospice yet and has decided to proceed with surgery. Per Dr. Stanford Breed, ok to schedule patient for left above knee amputation.

## 2021-08-06 NOTE — Telephone Encounter (Signed)
Spoke with patient's wife [name] and scheduled an in-person Palliative Consult for 08/19/21 @ [name] screening was negative. No pets in home. Patient lives with wife, son, DIL and grandson.   Consent obtained; updated Outlook/Netsmart/Team List and Epic.   Family is aware they may be receiving a call from provider the day before or day of to confirm appointment.

## 2021-08-10 ENCOUNTER — Ambulatory Visit: Payer: Medicare Other | Admitting: Podiatry

## 2021-08-10 ENCOUNTER — Other Ambulatory Visit: Payer: Self-pay

## 2021-08-10 DIAGNOSIS — I739 Peripheral vascular disease, unspecified: Secondary | ICD-10-CM

## 2021-08-17 ENCOUNTER — Encounter (HOSPITAL_COMMUNITY): Payer: Self-pay | Admitting: Vascular Surgery

## 2021-08-17 ENCOUNTER — Other Ambulatory Visit: Payer: Self-pay | Admitting: Vascular Surgery

## 2021-08-17 NOTE — Progress Notes (Signed)
DUE TO COVID-19 ONLY ONE VISITOR IS ALLOWED TO COME WITH YOU AND STAY IN THE WAITING ROOM ONLY DURING PRE OP AND PROCEDURE DAY OF SURGERY.   Two VISITORS MAY VISIT WITH YOU AFTER SURGERY IN YOUR PRIVATE ROOM DURING VISITING HOURS ONLY!  PCP - Dr Marton Redwood Cardiologist - n/a  Chest x-ray - 1027/22 (1V) EKG - 06/03/21 Stress Test - 08/04/16 CE ECHO - 06/04/21 Cardiac Cath - n/a  ICD Pacemaker/Loop - n/a  Sleep Study -  n/a CPAP - none  THE NIGHT BEFORE SURGERY, take 0-4 Units of Novolog Insulin if needed for high blood sugar.        THE MORNING OF SURGERY, take 0-4 Units of Novolog Insulin if needed for hight blood sugar.    If your blood sugar is less than 70 mg/dL, you will need to treat for low blood sugar: Treat a low blood sugar (less than 70 mg/dL) with  cup of clear juice (cranberry or apple), 4 glucose tablets, OR glucose gel. Recheck blood sugar in 15 minutes after treatment (to make sure it is greater than 70 mg/dL). If your blood sugar is not greater than 70 mg/dL on recheck, call [phone] for further instructions.  Blood Thinner Instructions:  Follow your surgeon's instructions on when to stop Eliquis prior to surgery.  Aspirin Instructions: Follow your surgeon's instructions on when to stop aspirin prior to surgery,  If no instructions were given by your surgeon then you will need to call the office for those instructions.  Anesthesia review: Yes  STOP now taking any Aspirin (unless otherwise instructed by your surgeon), Aleve, Naproxen, Ibuprofen, Motrin, Advil, Goody's, BC's, all herbal medications, fish oil, and all vitamins.   Coronavirus Screening Covid test on 08/17/21 was negative.   Does the patient have any of the following symptoms (Per Wife):  Cough yes/no: No Fever (>100.91F)  yes/no: No Runny nose yes/no: No Sore throat yes/no: No Difficulty breathing/shortness of breath  yes/no: No  Have you traveled in the last 14 days and where? yes/no:  No  Wife [name] verbalized understanding of instructions that were given via phone.

## 2021-08-18 ENCOUNTER — Encounter (HOSPITAL_COMMUNITY): Payer: Self-pay | Admitting: Vascular Surgery

## 2021-08-18 LAB — SARS CORONAVIRUS 2 (TAT 6-24 HRS): SARS Coronavirus 2: NEGATIVE

## 2021-08-18 NOTE — Progress Notes (Signed)
Anesthesia Chart Review: Same day workup  Follows with vascular surgery for hx of PVD.  08/05/20: R SFA stenting 08/07/20: R iliofemoral endarterectomy and fifth toe ray amputation 09/09/20: R external iliac stenting and popliteal angioplasty 10/08/20: debridement of right fifth toe amputation ulceration 11/02/20: R below knee amputationHis is on coumadin and Plavix. Per note by Laurence Slate, PA-C 10/01/20, he was instructed to stop his coumadin on 2/24.  Patient last seen by Dr. Stanford Breed on 08/03/2021.  At that time was noted that he had had a significant decline in his overall health with a worsening left heel ulcer.  Hospice measures were discussed.  Patient did subsequently decide to proceed with left AKA.   History of chronic systolic CHF (depressed EF since at least 2005 per review of records in Care everywhere, echo 03/2004 sowed EF 45%). History of remote MI at age 45. Most recent stress test 08/04/16 showed severe fixed defects consistent with prior infarction, negative for inducible ischemia.  He also has history of left ventricular apical thrombus and questionable history of paroxysmal A. fib and is maintained on apixaban.  Last seen by cardiology in October 2022 during admission for hip fracture. Cardiology was consulted at that time for preop clearance. Per note by Dr. Marcelle Smiling 06/04/21, "Pre-operative risk stratification: his chronic co-morbidities convey a high preoperative risk with revised cardiac risk index (RCRI) score of 5, a 15% risk of major adverse cardiovascular event in the 30-day post operative period. However, given he is NYHA class 1 HF and no anginal symptoms - there is no indication for further testing prior to surgery as he is best optimized for surgery in his current state." Echo was updated 06/04/21 showing ef 30-35%, severe akinesis of the left ventricular, mid-apical anteroseptal wall and apical segment, small pericardial effusion, severe MR.  Patient did undergo left hip  hemiarthroplasty and was seen postoperatively by Dr. Stanford Breed on 06/05/2021 during hospitalization.  He did note the findings on echocardiogram.  At that time he recommended continue Eliquis and Lipitor.  No other cardiology recommendations at that time.    Pt reported LD Eliquis 08/09/21.  IDDM2, last A1c 5.3 on 06/04/21.   ESRD on HD MWF (Hx failed kidney transplant 2015 - 2019).    Will need DOS labs and evaluation.   EKG 06/03/21: Sinus rhythm. Rate 91. Anterior infarct, old. Nonspecific T abnormalities, lateral leads. Baseline wander in lead(s) V5. No significant change since last tracing   TEE 09/18/19:  1. Left ventricular ejection fraction, by estimation, is 30 to 35%. The  left ventricle has severely decreased function. The left ventrical  demonstrates regional wall motion abnormalities (see scoring  diagram/findings for description). There is  moderately increased left ventricular hypertrophy. Left ventricular  diastolic function could not be evaluated. There is severe akinesis of the  left ventricular, entire apical segment.   2. Right ventricular systolic function is normal. The right ventricular  size is normal.   3. No left atrial/left atrial appendage thrombus was detected.   4. The mitral valve is normal in structure and function. Mild mitral  valve regurgitation. No evidence of mitral stenosis.   5. The aortic valve is tricuspid. Aortic valve regurgitation is trivial .  No aortic stenosis is present.   6. There is mild (Grade II) plaque involving the descending aorta.   7. Agitated saline contrast bubble study was negative, with no evidence  of any interatrial shunt.   Conclusion(s)/Recomendation(s): Normal biventricular function without  evidence of hemodynamically significant valvular heart  disease. No  evidence of vegetation/infective endocarditis on this transesophageal  echocardiogram. No LA/LAA thrombus identified. Negative bubble study for  interatrial shunt.  No intracardiac source of embolism detected on this on  this transesophageal echocardiogram.    TTE 08/01/19:  1. Left ventricular ejection fraction, by visual estimation, is 30 to  35%. The left ventricle has moderate to severely decreased function. There  is no left ventricular hypertrophy.   2. Severe dyskinesis of the left ventricular, entire apical segment.   3. Apical left ventricular aneurysm.   4. Definity contrast agent was given IV to delineate the left ventricular  endocardial borders.   5. No intracardiac thrombi or masses were visualized.   6. Left ventricular diastolic parameters are consistent with Grade I  diastolic dysfunction (impaired relaxation).   7. Global right ventricle has normal systolic function.The right  ventricular size is normal. No increase in right ventricular wall  thickness.   8. Left atrial size was normal.   9. Right atrial size was normal.  10. The mitral valve is normal in structure. Mild mitral valve  regurgitation. No evidence of mitral stenosis.  11. The tricuspid valve is normal in structure.  12. The aortic valve is normal in structure. Aortic valve regurgitation is  trivial. No evidence of aortic valve sclerosis or stenosis.  13. The pulmonic valve was normal in structure. Pulmonic valve  regurgitation is not visualized.  14. The inferior vena cava is normal in size with greater than 50%  respiratory variability, suggesting right atrial pressure of 3 mmHg.    TTE 11/30/17 (care everywhere): MODERATE LV DYSFUNCTION (See above) WITH MILD LVH    NORMAL LA PRESSURES WITH NORMAL DIASTOLIC FUNCTION    NORMAL RIGHT VENTRICULAR SYSTOLIC FUNCTION    VALVULAR REGURGITATION: TRIVIAL AR, TRIVIAL PR    NO VALVULAR STENOSIS    PERSISTENT LV THROMBUS REDUCED IN SIZE (1.6 cm X 1.0 cm)    NEGATIVE SALINE CONTRAST STUDY     Compared with prior Echo study on 08/04/2016: PERSISTENT LV APICAL    THROMBUS. NO SIGNIFICANT CHANGE IN LV FUNCTION.    Nuclear  stress 08/04/16 (care everywhere): FINAL COMMENTS    - No evidence of regadenoson-inducible ischemia.   - Severe, fixed apical/ periapical/ anteroseptal/ inferolateral perfusion defects, in keeping with infarction.   - LVEF= 24%. 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 Dilated LV.     [name] [facility] Stay [facility]/Anesthesiology Phone [phone] 08/18/2021 2:25 PM

## 2021-08-18 NOTE — Anesthesia Preprocedure Evaluation (Addendum)
Anesthesia Evaluation  Patient identified by MRN, date of birth, ID band Patient awake    Reviewed: Allergy & Precautions, NPO status , Patient's Chart, lab work & pertinent test results  Airway Mallampati: II  TM Distance: >3 FB Neck ROM: Full    Dental  (+) Poor Dentition   Pulmonary neg pulmonary ROS, former smoker,    Pulmonary exam normal        Cardiovascular hypertension, + CAD, + Past MI, + Peripheral Vascular Disease (on Eliquis) and +CHF  + dysrhythmias Atrial Fibrillation  Rhythm:Regular Rate:Normal     Neuro/Psych  Headaches, Dementia CVA    GI/Hepatic negative GI ROS, Neg liver ROS,   Endo/Other  diabetes, Type 2  Renal/GU ESRF and DialysisRenal disease  negative genitourinary   Musculoskeletal negative musculoskeletal ROS (+)   Abdominal Normal abdominal exam  (+)   Peds  Hematology  (+) anemia ,   Anesthesia Other Findings   Reproductive/Obstetrics                           Anesthesia Physical Anesthesia Plan  ASA: 4  Anesthesia Plan: General and Regional   Post-op Pain Management:    Induction: Intravenous  PONV Risk Score and Plan: 2 and Ondansetron, Dexamethasone and Treatment may vary due to age or medical condition  Airway Management Planned: Mask and LMA  Additional Equipment: None  Intra-op Plan:   Post-operative Plan: Extubation in OR  Informed Consent: I have reviewed the patients History and Physical, chart, labs and discussed the procedure including the risks, benefits and alternatives for the proposed anesthesia with the patient or authorized representative who has indicated his/her understanding and acceptance.     Dental advisory given  Plan Discussed with: CRNA  Anesthesia Plan Comments: (PAT note by Karoline Caldwell, PA-C:  Follows with vascular surgery for hx of PVD. 08/05/20: R SFA stenting 08/07/20: R iliofemoral endarterectomy and fifth toe  ray amputation 09/09/20: R external iliac stenting and popliteal angioplasty 10/08/20: debridement of right fifth toe amputation ulceration 11/02/20: R below knee amputationHis is on coumadin and Plavix. Per note by Laurence Slate, PA-C 10/01/20, he was instructed to stop his coumadin on 2/24.  Patient last seen by Dr. Stanford Breed on 08/03/2021.  At that time was noted that he had had a significant decline in his overall health with a worsening left heel ulcer.  Hospice measures were discussed.  Patient did subsequently decide to proceed with left AKA.  History of chronic systolic CHF (depressed EF since at least 2005 per review of records in Care everywhere, echo 03/2004 sowed EF 45%). History of remote MI at age 35. Most recent stress test 08/04/16 showed severe fixed defects consistent with prior infarction, negative for inducible ischemia.  He also has history of left ventricular apical thrombus and questionable history of paroxysmal A. fib and is maintained on apixaban.  Last seen by cardiology in October 2022 during admission for hip fracture. Cardiology was consulted at that time for preop clearance. Per note by Dr. Marcelle Smiling 06/04/21, "Pre-operative risk stratification: his chronic co-morbidities convey a high preoperative risk with revised cardiac risk index (RCRI) score of 5, a 15% risk of major adverse cardiovascular event in the 30-day post operative period. However, given he is NYHA class 1 HF and no anginal symptoms - there is no indication for further testing prior to surgery as he is best optimized for surgery in his current state." Echo was updated 06/04/21 showing ef 30-35%,  severe akinesis of the left ventricular, mid-apical anteroseptal wall and apical segment, small pericardial effusion, severe MR.  Patient did undergo left hip hemiarthroplasty and was seen postoperatively by Dr. Stanford Breed on 06/05/2021 during hospitalization.  He did note the findings on echocardiogram.  At that time he recommended continue  Eliquis and Lipitor.  No other cardiology recommendations at that time.   Pt reported LD Eliquis 08/09/21.  IDDM2, last A1c 5.3 on 06/04/21.  [facility] MWF(Hx failed kidney transplant 2015 - 2019).   Will need DOS labs and evaluation.   EKG 06/03/21: Sinus rhythm. Rate 91. Anterior infarct, old. Nonspecific T abnormalities, lateral leads. Baseline wander in lead(s) V5. No significant change since last tracing  TEE 09/18/19: 1. Left ventricular ejection fraction, by estimation, is 30 to 35%. The  left ventricle has severely decreased function. The left ventrical  demonstrates regional wall motion abnormalities (see scoring  diagram/findings for description). There is  moderately increased left ventricular hypertrophy. Left ventricular  diastolic function could not be evaluated. There is severe akinesis of the  left ventricular, entire apical segment.  2. Right ventricular systolic function is normal. The right ventricular  size is normal.  3. No left atrial/left atrial appendage thrombus was detected.  4. The mitral valve is normal in structure and function. Mild mitral  valve regurgitation. No evidence of mitral stenosis.  5. The aortic valve is tricuspid. Aortic valve regurgitation is trivial .  No aortic stenosis is present.  6. There is mild (Grade II) plaque involving the descending aorta.  7. Agitated saline contrast bubble study was negative, with no evidence  of any interatrial shunt.   Conclusion(s)/Recomendation(s): Normal biventricular function without  evidence of hemodynamically significant valvular heart disease. No  evidence of vegetation/infective endocarditis on this transesophageal  echocardiogram. No LA/LAA thrombus identified. Negative bubble study for  interatrial shunt. No intracardiac source of embolism detected on this on  this transesophageal echocardiogram.   TTE 08/01/19: 1. Left ventricular ejection fraction, by visual estimation, is 30 to   35%. The left ventricle has moderate to severely decreased function. There  is no left ventricular hypertrophy.  2. Severe dyskinesis of the left ventricular, entire apical segment.  3. Apical left ventricular aneurysm.  4. Definity contrast agent was given IV to delineate the left ventricular  endocardial borders.  5. No intracardiac thrombi or masses were visualized.  6. Left ventricular diastolic parameters are consistent with Grade I  diastolic dysfunction (impaired relaxation).  7. Global right ventricle has normal systolic function.The right  ventricular size is normal. No increase in right ventricular wall  thickness.  8. Left atrial size was normal.  9. Right atrial size was normal.  10. The mitral valve is normal in structure. Mild mitral valve  regurgitation. No evidence of mitral stenosis.  11. The tricuspid valve is normal in structure.  12. The aortic valve is normal in structure. Aortic valve regurgitation is  trivial. No evidence of aortic valve sclerosis or stenosis.  13. The pulmonic valve was normal in structure. Pulmonic valve  regurgitation is not visualized.  14. The inferior vena cava is normal in size with greater than 50%  respiratory variability, suggesting right atrial pressure of 3 mmHg.   TTE 11/30/17 (care everywhere): MODERATE LV DYSFUNCTION (See above) WITH MILD LVH  NORMAL LA PRESSURES WITH NORMAL DIASTOLIC FUNCTION  NORMAL RIGHT VENTRICULAR SYSTOLIC FUNCTION  VALVULAR REGURGITATION: TRIVIAL AR, TRIVIAL PR  NO VALVULAR STENOSIS  PERSISTENT LV THROMBUS REDUCED IN SIZE (1.6 cm X  1.0 cm)  NEGATIVE SALINE CONTRAST STUDY   Compared with prior Echo study on 08/04/2016: PERSISTENT LV APICAL  THROMBUS. NO SIGNIFICANT CHANGE IN LV FUNCTION.   Nuclear stress 08/04/16 (care everywhere): FINAL COMMENTS   - No evidence of regadenoson-inducible ischemia.  - Severe, fixed apical/ periapical/ anteroseptal/ inferolateralperfusion  defects, in keeping with infarction.  - LVEF= 24%. Global hypokinesis with apical akinesis &probable apical aneurysm. Dilated LV.   )      Anesthesia Quick Evaluation

## 2021-08-19 ENCOUNTER — Inpatient Hospital Stay (HOSPITAL_COMMUNITY): Payer: Medicare Other

## 2021-08-19 ENCOUNTER — Other Ambulatory Visit: Payer: Self-pay

## 2021-08-19 ENCOUNTER — Encounter (HOSPITAL_COMMUNITY)
Admission: RE | Disposition: A | Discharge status: Skilled Nursing Facility | Payer: Self-pay | Source: Home / Self Care | Attending: Internal Medicine

## 2021-08-19 ENCOUNTER — Inpatient Hospital Stay (HOSPITAL_COMMUNITY)
Admission: RE | Admit: 2021-08-19 | Discharge: 2021-08-28 | DRG: 616 | Disposition: A | Discharge status: Skilled Nursing Facility | Payer: Medicare Other | Attending: Internal Medicine | Admitting: Internal Medicine

## 2021-08-19 ENCOUNTER — Inpatient Hospital Stay (HOSPITAL_COMMUNITY): Payer: Medicare Other | Admitting: Vascular Surgery

## 2021-08-19 ENCOUNTER — Other Ambulatory Visit: Payer: Medicare Other | Admitting: Hospice

## 2021-08-19 DIAGNOSIS — I132 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 kidney disease, or end stage renal disease: Secondary | ICD-10-CM | POA: Diagnosis not present

## 2021-08-19 DIAGNOSIS — E1165 Type 2 diabetes mellitus with hyperglycemia: Secondary | ICD-10-CM | POA: Diagnosis present

## 2021-08-19 DIAGNOSIS — Z7982 Long term (current) use of aspirin: Secondary | ICD-10-CM

## 2021-08-19 DIAGNOSIS — Z7901 Long term (current) use of anticoagulants: Secondary | ICD-10-CM

## 2021-08-19 DIAGNOSIS — Z89619 Acquired absence of unspecified leg above knee: Secondary | ICD-10-CM

## 2021-08-19 DIAGNOSIS — Z7952 Long term (current) use of systemic steroids: Secondary | ICD-10-CM

## 2021-08-19 DIAGNOSIS — E11621 Type 2 diabetes mellitus with foot ulcer: Secondary | ICD-10-CM | POA: Diagnosis not present

## 2021-08-19 DIAGNOSIS — E1151 Type 2 diabetes mellitus with diabetic peripheral angiopathy without gangrene: Secondary | ICD-10-CM | POA: Diagnosis present

## 2021-08-19 DIAGNOSIS — I69320 Aphasia following cerebral infarction: Secondary | ICD-10-CM

## 2021-08-19 DIAGNOSIS — R627 Adult failure to thrive: Secondary | ICD-10-CM | POA: Diagnosis present

## 2021-08-19 DIAGNOSIS — Z833 Family history of diabetes mellitus: Secondary | ICD-10-CM

## 2021-08-19 DIAGNOSIS — T8612 Kidney transplant failure: Secondary | ICD-10-CM | POA: Diagnosis present

## 2021-08-19 DIAGNOSIS — I5022 Chronic systolic (congestive) heart failure: Secondary | ICD-10-CM | POA: Diagnosis present

## 2021-08-19 DIAGNOSIS — N186 End stage renal disease: Secondary | ICD-10-CM | POA: Diagnosis not present

## 2021-08-19 DIAGNOSIS — E875 Hyperkalemia: Secondary | ICD-10-CM | POA: Diagnosis present

## 2021-08-19 DIAGNOSIS — Z992 Dependence on renal dialysis: Secondary | ICD-10-CM

## 2021-08-19 DIAGNOSIS — I509 Heart failure, unspecified: Secondary | ICD-10-CM

## 2021-08-19 DIAGNOSIS — I739 Peripheral vascular disease, unspecified: Secondary | ICD-10-CM

## 2021-08-19 DIAGNOSIS — E11649 Type 2 diabetes mellitus with hypoglycemia without coma: Secondary | ICD-10-CM | POA: Diagnosis not present

## 2021-08-19 DIAGNOSIS — E1169 Type 2 diabetes mellitus with other specified complication: Secondary | ICD-10-CM | POA: Diagnosis present

## 2021-08-19 DIAGNOSIS — M868X7 Other osteomyelitis, ankle and foot: Secondary | ICD-10-CM | POA: Diagnosis present

## 2021-08-19 DIAGNOSIS — I70203 Unspecified atherosclerosis of native arteries of extremities, bilateral legs: Secondary | ICD-10-CM | POA: Diagnosis present

## 2021-08-19 DIAGNOSIS — L97429 Non-pressure chronic ulcer of left heel and midfoot with unspecified severity: Secondary | ICD-10-CM | POA: Diagnosis present

## 2021-08-19 DIAGNOSIS — Z20822 Contact with and (suspected) exposure to covid-19: Secondary | ICD-10-CM | POA: Diagnosis present

## 2021-08-19 DIAGNOSIS — Z87891 Personal history of nicotine dependence: Secondary | ICD-10-CM

## 2021-08-19 DIAGNOSIS — I70244 Atherosclerosis of native arteries of left leg with ulceration of heel and midfoot: Secondary | ICD-10-CM | POA: Diagnosis not present

## 2021-08-19 DIAGNOSIS — Y830 Surgical operation with transplant of whole organ as the cause of abnormal reaction of the patient, or of later complication, without mention of misadventure at the time of the procedure: Secondary | ICD-10-CM | POA: Diagnosis present

## 2021-08-19 DIAGNOSIS — D696 Thrombocytopenia, unspecified: Secondary | ICD-10-CM | POA: Diagnosis present

## 2021-08-19 DIAGNOSIS — D631 Anemia in chronic kidney disease: Secondary | ICD-10-CM | POA: Diagnosis present

## 2021-08-19 DIAGNOSIS — E1122 Type 2 diabetes mellitus with diabetic chronic kidney disease: Secondary | ICD-10-CM | POA: Diagnosis present

## 2021-08-19 DIAGNOSIS — Z96642 Presence of left artificial hip joint: Secondary | ICD-10-CM | POA: Diagnosis present

## 2021-08-19 DIAGNOSIS — Z89612 Acquired absence of left leg above knee: Secondary | ICD-10-CM | POA: Diagnosis not present

## 2021-08-19 DIAGNOSIS — Z79899 Other long term (current) drug therapy: Secondary | ICD-10-CM

## 2021-08-19 DIAGNOSIS — Z681 Body mass index (BMI) 19 or less, adult: Secondary | ICD-10-CM | POA: Diagnosis not present

## 2021-08-19 DIAGNOSIS — F0153 Vascular dementia, unspecified severity, with mood disturbance: Secondary | ICD-10-CM | POA: Diagnosis present

## 2021-08-19 DIAGNOSIS — N2581 Secondary hyperparathyroidism of renal origin: Secondary | ICD-10-CM | POA: Diagnosis present

## 2021-08-19 DIAGNOSIS — Z794 Long term (current) use of insulin: Secondary | ICD-10-CM

## 2021-08-19 DIAGNOSIS — I2510 Atherosclerotic heart disease of native coronary artery without angina pectoris: Secondary | ICD-10-CM | POA: Diagnosis present

## 2021-08-19 DIAGNOSIS — E889 Metabolic disorder, unspecified: Secondary | ICD-10-CM | POA: Diagnosis present

## 2021-08-19 DIAGNOSIS — E871 Hypo-osmolality and hyponatremia: Secondary | ICD-10-CM | POA: Diagnosis not present

## 2021-08-19 DIAGNOSIS — E43 Unspecified severe protein-calorie malnutrition: Secondary | ICD-10-CM | POA: Diagnosis present

## 2021-08-19 DIAGNOSIS — Z89511 Acquired absence of right leg below knee: Secondary | ICD-10-CM

## 2021-08-19 DIAGNOSIS — E785 Hyperlipidemia, unspecified: Secondary | ICD-10-CM | POA: Diagnosis present

## 2021-08-19 HISTORY — PX: AMPUTATION: SHX166

## 2021-08-19 HISTORY — PX: APPLICATION OF WOUND VAC: SHX5189

## 2021-08-19 HISTORY — DX: Personal history of other medical treatment: Z92.89

## 2021-08-19 LAB — POCT I-STAT, CHEM 8
BUN: 40 mg/dL — ABNORMAL HIGH (ref 8–23)
Calcium, Ion: 0.97 mmol/L — ABNORMAL LOW (ref 1.15–1.40)
Chloride: 97 mmol/L — ABNORMAL LOW (ref 98–111)
Creatinine, Ser: 4.9 mg/dL — ABNORMAL HIGH (ref 0.61–1.24)
Glucose, Bld: 105 mg/dL — ABNORMAL HIGH (ref 70–99)
HCT: 48 % (ref 39.0–52.0)
Hemoglobin: 16.3 g/dL (ref 13.0–17.0)
Potassium: 5.4 mmol/L — ABNORMAL HIGH (ref 3.5–5.1)
Sodium: 136 mmol/L (ref 135–145)
TCO2: 35 mmol/L — ABNORMAL HIGH (ref 22–32)

## 2021-08-19 LAB — GLUCOSE, CAPILLARY
Glucose-Capillary: 100 mg/dL — ABNORMAL HIGH (ref 70–99)
Glucose-Capillary: 132 mg/dL — ABNORMAL HIGH (ref 70–99)
Glucose-Capillary: 114 mg/dL — ABNORMAL HIGH (ref 70–99)
Glucose-Capillary: 130 mg/dL — ABNORMAL HIGH (ref 70–99)

## 2021-08-19 LAB — HEMOGLOBIN A1C
Hgb A1c MFr Bld: 4.9 % (ref 4.8–5.6)
Mean Plasma Glucose: 93.93 mg/dL

## 2021-08-19 LAB — TYPE AND SCREEN
ABO/RH(D): O POS
Antibody Screen: NEGATIVE

## 2021-08-19 LAB — APTT: aPTT: 25 seconds (ref 24–36)

## 2021-08-19 SURGERY — AMPUTATION, ABOVE KNEE
Anesthesia: Regional | Site: Leg Upper | Laterality: Left

## 2021-08-19 MED ORDER — LIDOCAINE-PRILOCAINE 2.5-2.5 % EX CREA
1.0000 "application " | TOPICAL_CREAM | CUTANEOUS | Status: DC
  Filled 2021-08-19: qty 5

## 2021-08-19 MED ORDER — FENTANYL CITRATE (PF) 100 MCG/2ML IJ SOLN: 25.0000 ug | INTRAMUSCULAR | Status: DC | PRN

## 2021-08-19 MED ORDER — LIDOCAINE HCL (CARDIAC) PF 100 MG/5ML IV SOSY
PREFILLED_SYRINGE | INTRAVENOUS | Status: DC | PRN
  Administered 2021-08-19: 40 mg via INTRATRACHEAL

## 2021-08-19 MED ORDER — ALLOPURINOL 100 MG PO TABS
100.0000 mg | ORAL_TABLET | Freq: Every day | ORAL | Status: DC
  Administered 2021-08-19 – 2021-08-28 (×10): 100 mg via ORAL
  Filled 2021-08-19 (×10): qty 1

## 2021-08-19 MED ORDER — ONDANSETRON HCL 4 MG/2ML IJ SOLN: 4.0000 mg | Freq: Four times a day (QID) | INTRAMUSCULAR | Status: DC | PRN

## 2021-08-19 MED ORDER — ONDANSETRON HCL 4 MG/2ML IJ SOLN
INTRAMUSCULAR | Status: DC | PRN
  Administered 2021-08-19: 4 mg via INTRAVENOUS

## 2021-08-19 MED ORDER — MELATONIN 5 MG PO TABS
5.0000 mg | ORAL_TABLET | Freq: Every evening | ORAL | Status: DC | PRN
  Administered 2021-08-19: 5 mg via ORAL
  Filled 2021-08-19: qty 1

## 2021-08-19 MED ORDER — ALBUTEROL SULFATE (2.5 MG/3ML) 0.083% IN NEBU: 2.5000 mg | INHALATION_SOLUTION | Freq: Three times a day (TID) | RESPIRATORY_TRACT | Status: DC | PRN

## 2021-08-19 MED ORDER — FENTANYL CITRATE (PF) 250 MCG/5ML IJ SOLN
INTRAMUSCULAR | Status: DC | PRN
  Administered 2021-08-19: 50 ug via INTRAVENOUS

## 2021-08-19 MED ORDER — CHLORHEXIDINE GLUCONATE 0.12 % MT SOLN
15.0000 mL | Freq: Once | OROMUCOSAL | Status: AC
  Administered 2021-08-19: 15 mL via OROMUCOSAL
  Filled 2021-08-19: qty 15

## 2021-08-19 MED ORDER — CHLORHEXIDINE GLUCONATE CLOTH 2 % EX PADS: 6.0000 | MEDICATED_PAD | Freq: Once | CUTANEOUS | Status: DC

## 2021-08-19 MED ORDER — 0.9 % SODIUM CHLORIDE (POUR BTL) OPTIME
TOPICAL | Status: DC | PRN
  Administered 2021-08-19: 1000 mL

## 2021-08-19 MED ORDER — FENTANYL CITRATE (PF) 100 MCG/2ML IJ SOLN
100.0000 ug | Freq: Once | INTRAMUSCULAR | Status: AC
  Administered 2021-08-19: 100 ug via INTRAVENOUS

## 2021-08-19 MED ORDER — GLYCOPYRROLATE 0.2 MG/ML IJ SOLN
INTRAMUSCULAR | Status: DC | PRN
Start: 2021-08-19 — End: 2021-08-19
  Administered 2021-08-19: .2 mg via INTRAVENOUS

## 2021-08-19 MED ORDER — ASPIRIN 81 MG PO CHEW
81.0000 mg | CHEWABLE_TABLET | Freq: Every day | ORAL | Status: DC
  Administered 2021-08-19 – 2021-08-28 (×10): 81 mg via ORAL
  Filled 2021-08-19 (×10): qty 1

## 2021-08-19 MED ORDER — ACETAMINOPHEN 10 MG/ML IV SOLN: 1000.0000 mg | Freq: Once | INTRAVENOUS | Status: DC | PRN

## 2021-08-19 MED ORDER — SEVELAMER CARBONATE 800 MG PO TABS
800.0000 mg | ORAL_TABLET | Freq: Three times a day (TID) | ORAL | Status: DC
  Administered 2021-08-19 – 2021-08-28 (×21): 800 mg via ORAL
  Filled 2021-08-19 (×23): qty 1

## 2021-08-19 MED ORDER — PREDNISONE 10 MG PO TABS
10.0000 mg | ORAL_TABLET | Freq: Every day | ORAL | Status: DC
  Administered 2021-08-19 – 2021-08-26 (×8): 10 mg via ORAL
  Filled 2021-08-19 (×8): qty 1

## 2021-08-19 MED ORDER — ROPIVACAINE HCL 5 MG/ML IJ SOLN
INTRAMUSCULAR | Status: DC | PRN
  Administered 2021-08-19: 30 mL via PERINEURAL

## 2021-08-19 MED ORDER — CLONIDINE HCL (ANALGESIA) 100 MCG/ML EP SOLN
EPIDURAL | Status: DC | PRN
  Administered 2021-08-19: 50 ug

## 2021-08-19 MED ORDER — FOLIC ACID 1 MG PO TABS
1.0000 mg | ORAL_TABLET | Freq: Every day | ORAL | Status: DC
  Administered 2021-08-19 – 2021-08-28 (×10): 1 mg via ORAL
  Filled 2021-08-19 (×10): qty 1

## 2021-08-19 MED ORDER — ATORVASTATIN CALCIUM 40 MG PO TABS
40.0000 mg | ORAL_TABLET | Freq: Every day | ORAL | Status: DC
  Administered 2021-08-19 – 2021-08-28 (×10): 40 mg via ORAL
  Filled 2021-08-19 (×10): qty 1

## 2021-08-19 MED ORDER — POLYETHYLENE GLYCOL 3350 17 G PO PACK
17.0000 g | PACK | Freq: Every day | ORAL | Status: DC | PRN
  Administered 2021-08-20 – 2021-08-21 (×2): 17 g via ORAL
  Filled 2021-08-19 (×2): qty 1

## 2021-08-19 MED ORDER — SODIUM ZIRCONIUM CYCLOSILICATE 10 G PO PACK: 10.0000 g | PACK | Freq: Once | ORAL | Status: DC

## 2021-08-19 MED ORDER — ORAL CARE MOUTH RINSE: 15.0000 mL | Freq: Once | OROMUCOSAL | Status: AC

## 2021-08-19 MED ORDER — INSULIN ASPART 100 UNIT/ML IJ SOLN
0.0000 [IU] | Freq: Three times a day (TID) | INTRAMUSCULAR | Status: DC
  Administered 2021-08-20 – 2021-08-21 (×3): 1 [IU] via SUBCUTANEOUS
  Administered 2021-08-22: 2 [IU] via SUBCUTANEOUS
  Administered 2021-08-24 – 2021-08-28 (×8): 1 [IU] via SUBCUTANEOUS

## 2021-08-19 MED ORDER — PROPOFOL 10 MG/ML IV BOLUS
INTRAVENOUS | Status: DC | PRN
  Administered 2021-08-19: 100 mg via INTRAVENOUS

## 2021-08-19 MED ORDER — FENTANYL CITRATE (PF) 100 MCG/2ML IJ SOLN
INTRAMUSCULAR | Status: AC
  Filled 2021-08-19: qty 2

## 2021-08-19 MED ORDER — BOOST HIGH PROTEIN PO LIQD
1.0000 | Freq: Every day | ORAL | Status: DC | PRN
  Filled 2021-08-19: qty 237

## 2021-08-19 MED ORDER — CEFAZOLIN SODIUM-DEXTROSE 2-4 GM/100ML-% IV SOLN
2.0000 g | INTRAVENOUS | Status: AC
  Administered 2021-08-19: 2 g via INTRAVENOUS
  Filled 2021-08-19: qty 100

## 2021-08-19 MED ORDER — HYDROCODONE-ACETAMINOPHEN 5-325 MG PO TABS
1.0000 | ORAL_TABLET | Freq: Two times a day (BID) | ORAL | Status: DC | PRN
  Administered 2021-08-19 – 2021-08-25 (×2): 1 via ORAL
  Filled 2021-08-19 (×2): qty 1

## 2021-08-19 MED ORDER — PROPOFOL 10 MG/ML IV BOLUS
INTRAVENOUS | Status: AC
  Filled 2021-08-19: qty 20

## 2021-08-19 MED ORDER — HEPARIN SODIUM (PORCINE) 5000 UNIT/ML IJ SOLN
5000.0000 [IU] | Freq: Three times a day (TID) | INTRAMUSCULAR | Status: DC
  Administered 2021-08-19 – 2021-08-20 (×4): 5000 [IU] via SUBCUTANEOUS
  Filled 2021-08-19 (×4): qty 1

## 2021-08-19 MED ORDER — SODIUM CHLORIDE 0.9 % IV SOLN: INTRAVENOUS | Status: DC | PRN

## 2021-08-19 MED ORDER — SODIUM CHLORIDE 0.9 % IV SOLN
INTRAVENOUS | Status: DC
  Administered 2021-08-19: 10 mL via INTRAVENOUS

## 2021-08-19 MED ORDER — FENTANYL CITRATE (PF) 250 MCG/5ML IJ SOLN
INTRAMUSCULAR | Status: AC
  Filled 2021-08-19: qty 5

## 2021-08-19 MED ORDER — LEVALBUTEROL TARTRATE 45 MCG/ACT IN AERO: 2.0000 | INHALATION_SPRAY | Freq: Three times a day (TID) | RESPIRATORY_TRACT | Status: DC | PRN

## 2021-08-19 MED ORDER — ONDANSETRON HCL 4 MG PO TABS: 4.0000 mg | ORAL_TABLET | Freq: Four times a day (QID) | ORAL | Status: DC | PRN

## 2021-08-19 MED ORDER — BOOST PLUS PO LIQD
237.0000 mL | Freq: Every day | ORAL | Status: DC | PRN
  Filled 2021-08-19: qty 237

## 2021-08-19 SURGICAL SUPPLY — 75 items
BAG COUNTER SPONGE SURGICOUNT (BAG) ×3 IMPLANT
BANDAGE ESMARK 6X9 LF (GAUZE/BANDAGES/DRESSINGS) ×2 IMPLANT
BIT DRILL 5/64X5 DISP (BIT) IMPLANT
BLADE SAGITTAL (BLADE)
BLADE SAGITTAL 25.0X1.19X90 (BLADE) IMPLANT
BLADE SAW GIGLI 510 (BLADE) ×3 IMPLANT
BLADE SAW THK.89X75X18XSGTL (BLADE) IMPLANT
BLADE SURG 21 STRL SS (BLADE) ×3 IMPLANT
BNDG COHESIVE 6X5 TAN NS LF (GAUZE/BANDAGES/DRESSINGS) ×1 IMPLANT
BNDG COHESIVE 6X5 TAN STRL LF (GAUZE/BANDAGES/DRESSINGS) ×3 IMPLANT
BNDG ELASTIC 4X5.8 VLCR STR LF (GAUZE/BANDAGES/DRESSINGS) ×3 IMPLANT
BNDG ELASTIC 6X5.8 VLCR STR LF (GAUZE/BANDAGES/DRESSINGS) ×3 IMPLANT
BNDG ESMARK 6X9 LF (GAUZE/BANDAGES/DRESSINGS) ×6
BNDG GAUZE ELAST 4 BULKY (GAUZE/BANDAGES/DRESSINGS) ×3 IMPLANT
CANISTER SUCT 3000ML PPV (MISCELLANEOUS) ×3 IMPLANT
CANISTER WOUND CARE 500ML ATS (WOUND CARE) ×1 IMPLANT
CHLORAPREP W/TINT 26 (MISCELLANEOUS) ×3 IMPLANT
CLIP LIGATING EXTRA MED SLVR (CLIP) ×1 IMPLANT
CLIP LIGATING EXTRA SM BLUE (MISCELLANEOUS) ×1 IMPLANT
COVER SURGICAL LIGHT HANDLE (MISCELLANEOUS) ×3 IMPLANT
CUFF TOURN SGL QUICK 18 NS (TOURNIQUET CUFF) ×1 IMPLANT
CUFF TOURN SGL QUICK 24 (TOURNIQUET CUFF)
CUFF TOURN SGL QUICK 34 (TOURNIQUET CUFF)
CUFF TRNQT CYL 24X4X16.5-23 (TOURNIQUET CUFF) IMPLANT
CUFF TRNQT CYL 34X4.125X (TOURNIQUET CUFF) IMPLANT
DRAPE DERMATAC (DRAPES) IMPLANT
DRAPE HALF SHEET 40X57 (DRAPES) ×3 IMPLANT
DRAPE INCISE 23X17 IOBAN STRL (DRAPES) ×1
DRAPE INCISE 23X17 STRL (DRAPES) IMPLANT
DRAPE INCISE IOBAN 23X17 STRL (DRAPES) ×2 IMPLANT
DRAPE INCISE IOBAN 66X45 STRL (DRAPES) IMPLANT
DRAPE ORTHO SPLIT 77X108 STRL (DRAPES) ×2
DRAPE SURG ORHT 6 SPLT 77X108 (DRAPES) ×4 IMPLANT
DRESSING PEEL AND PLAC PRVNA20 (GAUZE/BANDAGES/DRESSINGS) IMPLANT
DRESSING PREVENA PLUS CUSTOM (GAUZE/BANDAGES/DRESSINGS) IMPLANT
DRSG PEEL AND PLACE PREVENA 20 (GAUZE/BANDAGES/DRESSINGS) ×3
DRSG PREVENA PLUS CUSTOM (GAUZE/BANDAGES/DRESSINGS)
ELECT CAUTERY BLADE 6.4 (BLADE) ×3 IMPLANT
ELECT REM PT RETURN 9FT ADLT (ELECTROSURGICAL) ×3
ELECTRODE REM PT RTRN 9FT ADLT (ELECTROSURGICAL) ×2 IMPLANT
EVACUATOR SILICONE 100CC (DRAIN) IMPLANT
GAUZE SPONGE 4X4 12PLY STRL (GAUZE/BANDAGES/DRESSINGS) ×3 IMPLANT
GAUZE XEROFORM 5X9 LF (GAUZE/BANDAGES/DRESSINGS) ×3 IMPLANT
GLOVE SURG POLYISO LF SZ8 (GLOVE) ×3 IMPLANT
GOWN STRL REUS W/ TWL LRG LVL3 (GOWN DISPOSABLE) ×4 IMPLANT
GOWN STRL REUS W/ TWL XL LVL3 (GOWN DISPOSABLE) ×2 IMPLANT
GOWN STRL REUS W/TWL LRG LVL3 (GOWN DISPOSABLE) ×2
GOWN STRL REUS W/TWL XL LVL3 (GOWN DISPOSABLE) ×1
KIT BASIN OR (CUSTOM PROCEDURE TRAY) ×3 IMPLANT
KIT TURNOVER KIT B (KITS) ×3 IMPLANT
NS IRRIG 1000ML POUR BTL (IV SOLUTION) ×3 IMPLANT
PACK GENERAL/GYN (CUSTOM PROCEDURE TRAY) ×3 IMPLANT
PAD ARMBOARD 7.5X6 YLW CONV (MISCELLANEOUS) ×6 IMPLANT
PENCIL SMOKE EVACUATOR (MISCELLANEOUS) ×3 IMPLANT
PREVENA RESTOR ARTHOFORM 46X30 (CANNISTER) IMPLANT
STAPLER SKIN 35 REG (STAPLE) ×3 IMPLANT
STAPLER VISISTAT 35W (STAPLE) ×3 IMPLANT
STOCKINETTE IMPERVIOUS LG (DRAPES) ×3 IMPLANT
SUT ETHILON 2 0 PSLX (SUTURE) ×6 IMPLANT
SUT ETHILON 3 0 PS 1 (SUTURE) IMPLANT
SUT FIBERWIRE #5 38 CONV BLUE (SUTURE)
SUT SILK 0 TIES 10X30 (SUTURE) ×3 IMPLANT
SUT SILK 2 0 (SUTURE) ×1
SUT SILK 2 0 SH CR/8 (SUTURE) ×3 IMPLANT
SUT SILK 2 0 TIES 17X18 (SUTURE) ×1
SUT SILK 2-0 18XBRD TIE 12 (SUTURE) ×2 IMPLANT
SUT SILK 2-0 18XBRD TIE BLK (SUTURE) IMPLANT
SUT SILK 3 0 (SUTURE) ×1
SUT SILK 3-0 18XBRD TIE 12 (SUTURE) IMPLANT
SUT VIC AB 2-0 CT1 18 (SUTURE) ×6 IMPLANT
SUTURE FIBERWR #5 38 CONV BLUE (SUTURE) IMPLANT
TAPE UMBILICAL COTTON 1/8X30 (MISCELLANEOUS) ×3 IMPLANT
TOWEL GREEN STERILE (TOWEL DISPOSABLE) ×6 IMPLANT
UNDERPAD 30X36 HEAVY ABSORB (UNDERPADS AND DIAPERS) ×3 IMPLANT
WATER STERILE IRR 1000ML POUR (IV SOLUTION) ×3 IMPLANT

## 2021-08-19 NOTE — Plan of Care (Signed)
  Problem: Health Behavior/Discharge Planning: Goal: Ability to manage health-related needs will improve Outcome: Progressing   

## 2021-08-19 NOTE — Progress Notes (Signed)
Patient somewhat confused and not totally oriented. Asked wife [name] to come back and consent for the surgery.  She stated he had the beginning stages of dementia.    Unable to obtain a urine sample, patient oliguric.

## 2021-08-19 NOTE — Anesthesia Procedure Notes (Signed)
Anesthesia Regional Block: Sciatic   Pre-Anesthetic Checklist: , timeout performed,  Correct Patient, Correct Site, Correct Laterality,  Correct Procedure, Correct Position, site marked,  Risks and benefits discussed,  Surgical consent,  Pre-op evaluation,  At surgeon's request and post-op pain management  Laterality: Left  Prep: Dura Prep       Needles:  Injection technique: Single-shot  Needle Type: Echogenic Stimulator Needle     Needle Length: 10cm  Needle Gauge: 20     Additional Needles:   Procedures:,,,, ultrasound used (permanent image in chart),,    Narrative:  Start time: 08/19/2021 10:45 AM End time: 08/19/2021 10:48 AM Injection made incrementally with aspirations every 5 mL.  Performed by: Personally  Anesthesiologist: Darral Dash, DO  Additional Notes: Patient identified. Risks/Benefits/Options discussed with patient including but not limited to bleeding, infection, nerve damage, failed block, incomplete pain control. Patient expressed understanding and wished to proceed. All questions were answered. Sterile technique was used throughout the entire procedure. Please see nursing notes for vital signs. Aspirated in 5cc intervals with injection for negative confirmation. Patient was given instructions on fall risk and not to get out of bed. All questions and concerns addressed with instructions to call with any issues or inadequate analgesia.

## 2021-08-19 NOTE — Interval H&P Note (Signed)
History and Physical Interval Note:  08/19/2021 10:42 AM  Since my last discussion with Jonathan [name], he is deteriorated.  We discussed transition to hospice care.  He has since reversed this decision and desires a left below-knee amputation for nonsalvageable lower extremity heel ulcer with likely osteomyelitis.   Jonathan L Luger Sr.  has presented today for surgery, with the diagnosis of PAD.  The various methods of treatment have been discussed with the patient and family. After consideration of risks, benefits and other options for treatment, the patient has consented to  Procedure(s): LEFT ABOVE KNEE AMPUTATION (Left) as a surgical intervention.  The patient's history has been reviewed, patient examined, no change in status, stable for surgery.  I have reviewed the patient's chart and labs.  Questions were answered to the patient's satisfaction.     [name]

## 2021-08-19 NOTE — Anesthesia Procedure Notes (Signed)
Anesthesia Regional Block: Femoral nerve block   Pre-Anesthetic Checklist: , timeout performed,  Correct Patient, Correct Site, Correct Laterality,  Correct Procedure, Correct Position, site marked,  Risks and benefits discussed,  Surgical consent,  Pre-op evaluation,  At surgeon's request and post-op pain management  Laterality: Left  Prep: Dura Prep       Needles:  Injection technique: Single-shot  Needle Type: Echogenic Stimulator Needle     Needle Length: 10cm  Needle Gauge: 20     Additional Needles:   Procedures:,,,, ultrasound used (permanent image in chart),,    Narrative:  Start time: 08/19/2021 10:40 AM End time: 08/19/2021 10:42 AM Injection made incrementally with aspirations every 5 mL.  Performed by: Personally  Anesthesiologist: Darral Dash, DO  Additional Notes: Patient identified. Risks/Benefits/Options discussed with patient including but not limited to bleeding, infection, nerve damage, failed block, incomplete pain control. Patient expressed understanding and wished to proceed. All questions were answered. Sterile technique was used throughout the entire procedure. Please see nursing notes for vital signs. Aspirated in 5cc intervals with injection for negative confirmation. Patient was given instructions on fall risk and not to get out of bed. All questions and concerns addressed with instructions to call with any issues or inadequate analgesia.

## 2021-08-19 NOTE — H&P (Signed)
History and Physical    Jonathan [name] OIT:[id number] DOB: Jun 17, 1951 DOA: 08/19/2021  PCP: Jonathan [name]., MD (Confirm with patient/family/NH records and if not entered, this has to be entered at [facility] point of entry) Patient coming from: Home  I have personally briefly reviewed patient's old medical records in [place]  Chief Complaint: Patient nonverbal  HPI: Jonathan [name]. is a 71 y.o. 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  Patient has a chronic left heel ulcer, and recently broke his knee and then mobility significantly deteriorated.  Vascular surgery offered left leg above-knee amputation.  Patient underwent left AKA today, tolerated procedure well, pain is controlled.  Received about 200-300 mL fluid during procedure, no complaint of shortness of breath or chest pains.   Review of Systems: Unable to perform, patient nonverbal at baseline.  Past Medical History:  Diagnosis Date   CAD (coronary artery disease)    CHF (congestive heart failure) ([name])    Diabetes mellitus without complication ([place])    type 2   DM (diabetes mellitus) ([place])    ESRD (end stage renal disease) ([place])    dialysis Mon Wed Fri   FUO (fever of unknown origin) 09/17/2019   History of blood transfusion    Hyperlipidemia    Hypertension    Osteomyelitis ([name])    Peripheral arterial disease ([name])    PVD (peripheral vascular disease) ([place])    Renal disorder 2015   right kidney transplant   Stroke [facility])    Vascular dementia [facility])     Past Surgical History:  Procedure Laterality Date   ABDOMINAL AORTOGRAM N/A 08/05/2020   Procedure: ABDOMINAL AORTOGRAM;  Surgeon: [name], MD;  Location: [place]  CV LAB;  Service: Cardiovascular;  Laterality: N/A;   ABDOMINAL AORTOGRAM W/LOWER EXTREMITY N/A 09/09/2020   Procedure: ABDOMINAL AORTOGRAM W/LOWER EXTREMITY;  Surgeon: [name], MD;  Location: [place] CV LAB;  Service: Cardiovascular;  Laterality: N/A;   AMPUTATION Right 08/06/2020   Procedure: Right Fifth Toe Ray Amputation;  Surgeon: [name], MD;  Location: [place];  Service: Vascular;  Laterality: Right;   AMPUTATION Right 11/02/2020   Procedure: RIGHT AMPUTATION BELOW KNEE;  Surgeon: [name], MD;  Location: [place];  Service: Vascular;  Laterality: Right;   BACK SURGERY     Has had 2 back surgeries   BUBBLE STUDY  09/18/2019   Procedure: BUBBLE STUDY;  Surgeon: Buford Dresser, MD;  Location: [place];  Service: Cardiovascular;;   Depression     ENDARTERECTOMY FEMORAL Right 08/06/2020   Procedure: Right Ilio- Femoral Artery Endarterectomy;  Surgeon: [name], MD;  Location: [facility] OR;  Service: Vascular;  Laterality: Right;   HAND SURGERY Right    HAND SURGERY     HIP ARTHROPLASTY Left 06/04/2021   Procedure: ARTHROPLASTY BIPOLAR HIP (HEMIARTHROPLASTY);  Surgeon: Willaim Sheng, MD;  Location: [place];  Service: Orthopedics;  Laterality: Left;   IR DIALY SHUNT INTRO NEEDLE/INTRACATH INITIAL W/IMG RIGHT Right 06/07/2021   IR REMOVAL TUN CV CATH W/O FL  09/03/2019   KIDNEY TRANSPLANT     November 2015   LEG AMPUTATION BELOW KNEE Right    LOWER EXTREMITY ANGIOGRAPHY  Bilateral 08/05/2020   Procedure: LOWER EXTREMITY ANGIOGRAPHY;  Surgeon: [name], MD;  Location: [place] CV LAB;  Service: Cardiovascular;  Laterality: Bilateral;   Memory loss     NEPHRECTOMY TRANSPLANTED [name]     PATCH ANGIOPLASTY Right 08/06/2020   Procedure: PATCH ANGIOPLASTY;  Surgeon: [name], MD;  Location: [facility] OR;  Service: Vascular;  Laterality: Right;   PERIPHERAL VASCULAR BALLOON ANGIOPLASTY Right 09/09/2020   Procedure: PERIPHERAL VASCULAR  BALLOON ANGIOPLASTY;  Surgeon: [name], MD;  Location: [place] CV LAB;  Service: Cardiovascular;  Laterality: Right;  popliteal   PERIPHERAL VASCULAR INTERVENTION Right 08/05/2020   Procedure: PERIPHERAL VASCULAR INTERVENTION;  Surgeon: [name], MD;  Location: [place] CV LAB;  Service: Cardiovascular;  Laterality: Right;  SFA   PERIPHERAL VASCULAR INTERVENTION Right 09/09/2020   Procedure: PERIPHERAL VASCULAR INTERVENTION;  Surgeon: [name], MD;  Location: [place] CV LAB;  Service: Cardiovascular;  Laterality: Right;  external iliac   TEE WITHOUT CARDIOVERSION N/A 09/18/2019   Procedure: TRANSESOPHAGEAL ECHOCARDIOGRAM (TEE);  Surgeon: Buford Dresser, MD;  Location: [place];  Service: Cardiovascular;  Laterality: N/A;   WOUND DEBRIDEMENT Right 10/08/2020   Procedure: DEBRIDEMENT WOUND RIGHT FOOT;  Surgeon: [name], MD;  Location: [place];  Service: Vascular;  Laterality: Right;     reports that he has quit smoking. His smoking use included cigarettes. He smoked an average of 1 pack per day. He has never used smokeless tobacco. He reports that he does not currently use alcohol. He reports that he does not use drugs.  No Known Allergies  Family History  Problem Relation Age of Onset   Diabetes Mellitus II Mother    Cancer Mother        STOMACH   Diabetes Mellitus II Brother    Other Father        UNKOWN   Healthy Daughter    Diabetes Son      Prior to Admission medications   Medication Sig Start Date End Date Taking? Authorizing Provider  allopurinol (ZYLOPRIM) 100 MG tablet Take 100 mg by mouth at bedtime. 06/09/14  Yes [provider]  apixaban (ELIQUIS) 5 MG TABS tablet Take 1 tablet (5 mg total) by mouth 2 (two) times daily. 06/23/21  Yes Love, Ivan Anchors, PA-C  aspirin 81 MG chewable tablet Chew 81 mg by mouth daily.  06/09/14  Yes [provider]  atorvastatin (LIPITOR) 40 MG tablet Take 40 mg by mouth at  bedtime.   Yes [provider]  b complex-vitamin c-folic acid (NEPHRO-VITE) 0.8 MG TABS tablet Take 1 tablet by mouth daily. 05/08/20  Yes [provider]  feeding supplement (BOOST HIGH PROTEIN) LIQD Take 1 Container by mouth daily as needed (nutrition).   Yes [provider]  folic acid (FOLVITE) 1 MG tablet Take 1 tablet (1 mg total) by mouth daily. 05/11/21  Yes Garvin Fila, MD  HYDROcodone-acetaminophen (NORCO/VICODIN) 5-325 MG tablet Take 1 tablet by mouth 2 (two) times daily as needed for moderate pain. 06/23/21  Yes Love, Ivan Anchors, PA-C  Inulin (FIBER CHOICE PO) Take 1 tablet by mouth daily.   Yes [provider]  lidocaine-prilocaine (EMLA) cream Apply 1 application topically 3 (three) times a week. 30 minutes prior to Dialysis - MWF 05/08/18  Yes [provider]  melatonin 5 MG TABS Take 1 tablet (5 mg total) by mouth at bedtime. Patient taking differently: Take 5 mg by mouth at bedtime as  needed (sleep). 06/23/21  Yes Love, Ivan Anchors, PA-C  Nystatin (GERHARDT'S BUTT CREAM) CREA Apply 1 application topically 4 (four) times daily. Mix desitin with antifugal cream and apply a layer to buttocks/scrotum. 06/25/21  Yes Love, Ivan Anchors, PA-C  polyethylene glycol (MIRALAX / GLYCOLAX) 17 g packet Take 17 g by mouth daily as needed for mild constipation. 06/10/21  Yes Dahal, Marlowe Aschoff, MD  predniSONE (DELTASONE) 5 MG tablet Take 5 mg by mouth daily with breakfast. 03/02/20  Yes [provider]  sevelamer carbonate (RENVELA) 800 MG tablet Take 1 tablet (800 mg total) by mouth 3 (three) times daily with meals. 06/25/21  Yes Love, Ivan Anchors, PA-C  witch hazel-glycerin (TUCKS) pad Apply topically as needed for itching. 06/25/21  Yes Love, Ivan Anchors, PA-C  acidophilus (RISAQUAD) CAPS capsule Take 2 capsules by mouth 3 (three) times daily. Patient not taking: Reported on 08/12/2021 06/23/21   Love, Ivan Anchors, PA-C  diphenoxylate-atropine (LOMOTIL) 2.5-0.025 MG  tablet Take 1 tablet by mouth at bedtime. Patient not taking: Reported on 08/12/2021 06/23/21   Love, Ivan Anchors, PA-C  hydrocortisone (ANUSOL-HC) 25 MG suppository Place 1 suppository (25 mg total) rectally 2 (two) times daily. Patient not taking: Reported on 08/12/2021 06/23/21   Love, Ivan Anchors, PA-C  insulin aspart (NOVOLOG) 100 UNIT/ML injection Inject 0-9 Units into the skin as needed for high blood sugar. 06/23/21   Love, Ivan Anchors, PA-C  Insulin Pen Needle 32G X 4 MM MISC 1 each by Other route as needed (insulin).  09/10/16   [provider]  levalbuterol Penne Lash HFA) 45 MCG/ACT inhaler Inhale 2 puffs into the lungs every 8 (eight) hours as needed for wheezing. 09/21/19   Raiford Noble Latif, DO  nystatin (MYCOSTATIN/NYSTOP) powder Apply topically 3 (three) times daily. Patient not taking: Reported on 08/12/2021 06/23/21   Love, Ivan Anchors, PA-C  nystatin cream (MYCOSTATIN) Apply topically 2 (two) times daily. Patient not taking: Reported on 08/12/2021 06/23/21   Flora Lipps    Physical Exam: Vitals:   08/19/21 1051 08/19/21 1056 08/19/21 1100 08/19/21 1225  BP: 125/60 119/61 129/62 118/69  Pulse:   75 89  Resp: 14 15 16 10   Temp:    97.7 F (36.5 C)  TempSrc:      SpO2:   100% 100%  Weight:      Height:        Constitutional: NAD, calm, comfortable Vitals:   08/19/21 1051 08/19/21 1056 08/19/21 1100 08/19/21 1225  BP: 125/60 119/61 129/62 118/69  Pulse:   75 89  Resp: 14 15 16 10   Temp:    97.7 F (36.5 C)  TempSrc:      SpO2:   100% 100%  Weight:      Height:       Eyes: PERRL, lids and conjunctivae normal ENMT: Mucous membranes are moist. Posterior pharynx clear of any exudate or lesions.Normal dentition.  Neck: normal, supple, no masses, no thyromegaly Respiratory: clear to auscultation bilaterally, no wheezing, no crackles. Normal respiratory effort. No accessory muscle use.  Cardiovascular: Regular rate and rhythm, no murmurs / rubs / gallops. No extremity  edema. 2+ pedal pulses. No carotid bruits.  Abdomen: no tenderness, no masses palpated. No hepatosplenomegaly. Bowel sounds positive.  Musculoskeletal: no clubbing / cyanosis. No joint deformity upper and lower extremities. Good ROM, no contractures. Normal muscle tone.  Skin: Surgical site/left leg stump connected to wound VAC Neurologic: CN 2-12 grossly intact. Sensation intact, DTR normal. Strength 5/5 in all  4.  Psychiatric: Normal judgment and insight. Alert and oriented x 3. Normal mood.     Labs on Admission: I have personally reviewed following labs and imaging studies  CBC: Recent Labs  Lab 08/19/21 0925  HGB 16.3  HCT 48.2   Basic Metabolic Panel: Recent Labs  Lab 08/19/21 0925  NA 136  K 5.4*  CL 97*  GLUCOSE 105*  BUN 40*  CREATININE 4.90*   GFR: Estimated Creatinine Clearance: 12.8 mL/min (A) (by C-G formula based on SCr of 4.9 mg/dL (H)). Liver Function Tests: No results for input(s): AST, ALT, ALKPHOS, BILITOT, PROT, ALBUMIN in the last 168 hours. No results for input(s): LIPASE, AMYLASE in the last 168 hours. No results for input(s): AMMONIA in the last 168 hours. Coagulation Profile: No results for input(s): INR, PROTIME in the last 168 hours. Cardiac Enzymes: No results for input(s): CKTOTAL, CKMB, CKMBINDEX, TROPONINI in the last 168 hours. BNP (last 3 results) No results for input(s): PROBNP in the last 8760 hours. HbA1C: No results for input(s): HGBA1C in the last 72 hours. CBG: Recent Labs  Lab 08/19/21 0856 08/19/21 1225  GLUCAP 100* 132*   Lipid Profile: No results for input(s): CHOL, HDL, LDLCALC, TRIG, CHOLHDL, LDLDIRECT in the last 72 hours. Thyroid Function Tests: No results for input(s): TSH, T4TOTAL, FREET4, T3FREE, THYROIDAB in the last 72 hours. Anemia Panel: No results for input(s): VITAMINB12, FOLATE, FERRITIN, TIBC, IRON, RETICCTPCT in the last 72 hours. Urine analysis:    Component Value Date/Time   COLORURINE AMBER (A)  09/11/2019 0534   APPEARANCEUR TURBID (A) 09/11/2019 0534   LABSPEC 1.012 09/11/2019 0534   PHURINE 8.0 09/11/2019 0534   GLUCOSEU NEGATIVE 09/11/2019 0534   HGBUR LARGE (A) 09/11/2019 0534   BILIRUBINUR NEGATIVE 09/11/2019 [place] 09/11/2019 0534   PROTEINUR 100 (A) 09/11/2019 0534   NITRITE NEGATIVE 09/11/2019 0534   LEUKOCYTESUR LARGE (A) 09/11/2019 0534    Radiological Exams on Admission: No results found.  EKG: Ordered  Assessment/Plan Principal Problem:   S/P AKA (above knee amputation) unilateral, left ([name]) Active Problems:   PVD (peripheral vascular disease) ([name])  (please populate well all problems here in Problem List. (For example, if patient is on BP meds at home and you resume or decide to hold them, it is a problem that needs to be her. Same for CAD, COPD, HLD and so on)  Left leg nonhealing wound status post left AKA -Discussed with vascular surgeon, agreed with chemical DVT prophylaxis, will hold Eliquis for now. -Pain control -Vascular surgeon recommend PT tomorrow and probably rehab to follow. -Postop EKG ordered.  Hyperkalemia -1 dose of Lokelma ordered -Recheck BMP tomorrow, HD as scheduled.  LV thrombosis -On DVT prophylaxis, resume Eliquis tomorrow if vascular surgery agrees.  HTN -Stable, continue home BP meds.  IDDM -Thin sliding scale.  ESRD on HD -We will contact nephrology tomorrow for routine dialysis.  Chronic steroid dependence -We will double dose home dose of steroid for now   DVT prophylaxis: Heparin subcu Code Status: Full code Family Communication: None at bedside Disposition Plan: Expect 1 to 2 days hospital stay, expect discharge to rehab. Consults called: Vascular surgery Admission status: Telemetry admission   Lequita Halt MD [facility] Pager [phone]  08/19/2021, 12:59 PM

## 2021-08-19 NOTE — Anesthesia Procedure Notes (Signed)
Procedure Name: LMA Insertion Date/Time: 08/19/2021 11:24 AM Performed by: Babs Bertin, CRNA Pre-anesthesia Checklist: Patient identified, Emergency Drugs available, Suction available and Patient being monitored Patient Re-evaluated:Patient Re-evaluated prior to induction Oxygen Delivery Method: Circle System Utilized Preoxygenation: Pre-oxygenation with 100% oxygen Induction Type: IV induction Ventilation: Mask ventilation without difficulty LMA: LMA inserted LMA Size: 4.0 Number of attempts: 1 Airway Equipment and Method: Bite block Placement Confirmation: positive ETCO2 Tube secured with: Tape Dental Injury: Teeth and Oropharynx as per pre-operative assessment

## 2021-08-19 NOTE — Progress Notes (Signed)
Orthopedic Tech Progress Note Patient Details:  Jonathan [name] 1951-03-22 [id number] Called in order to [employer] for shrinker Patient ID: [name]., male   DOB: 1950/09/09, 71 y.o.   MRN: [id number]  [name] 08/19/2021, 2:58 PM

## 2021-08-19 NOTE — Anesthesia Postprocedure Evaluation (Signed)
Anesthesia Post Note  Patient: Jonathan Dollar Sr.  Procedure(s) Performed: LEFT ABOVE KNEE AMPUTATION (Left: Knee) APPLICATION OF WOUND VAC (Left: Leg Upper)     Patient location during evaluation: PACU Anesthesia Type: Regional and General Level of consciousness: awake and alert Pain management: pain level controlled Vital Signs Assessment: post-procedure vital signs reviewed and stable Respiratory status: spontaneous breathing, nonlabored ventilation, respiratory function stable and patient connected to nasal cannula oxygen Cardiovascular status: blood pressure returned to baseline and stable Postop Assessment: no apparent nausea or vomiting Anesthetic complications: no   No notable events documented.  Last Vitals:  Vitals:   08/19/21 1455 08/19/21 1549  BP: 136/77 137/77  Pulse: 67 80  Resp: 18 19  Temp:  36.9 C  SpO2: 96% 96%    Last Pain:  Vitals:   08/19/21 1549  TempSrc: Oral  PainSc:                  March Rummage Jagar Lua

## 2021-08-19 NOTE — Op Note (Signed)
DATE OF SERVICE: 08/19/2021  PATIENT:  Jonathan Dollar Sr.  71 y.o. male  PRE-OPERATIVE DIAGNOSIS: atherosclerosis of left lower extremity without options for vascular reconstruction  POST-OPERATIVE DIAGNOSIS:  Same  PROCEDURE:   Left above knee amputation  SURGEON:  Surgeon(s) and Role:    * [name], MD - Primary  ASSISTANT: Arlee Muslim, PA-C  An assistant was required to facilitate exposure and expedite the case.  ANESTHESIA:   general  EBL: 33mL  BLOOD ADMINISTERED:none  DRAINS: none   LOCAL MEDICATIONS USED:  NONE  SPECIMEN:  residual left limb  COUNTS: confirmed correct.  TOURNIQUET:    Total Tourniquet Time Documented: Thigh (Right) - 3 minutes Total: Thigh (Right) - 3 minutes   PATIENT DISPOSITION:  PACU - hemodynamically stable.   Delay start of Pharmacological VTE agent (>24hrs) due to surgical blood loss or risk of bleeding: no  INDICATION FOR PROCEDURE: Jonathan [name]. is a 71 y.o. male with left lower extremity severe atherosclerosis; left heel ulceration with likely calcaneal osteomyelitis; and no options for vascular reconstruction. After careful discussion of risks, benefits, and alternatives the patient was offered left above knee amputation. The patient understood and wished to proceed.  OPERATIVE FINDINGS: grossly healthy tissue at amputation margin. Unremarkable above knee amputation.  DESCRIPTION OF PROCEDURE: After identification of the patient in the pre-operative holding area, the patient was transferred to the operating room. The patient was positioned supine on the operating room table. Anesthesia was induced. The left leg was prepped and draped in standard fashion. A surgical pause was performed confirming correct patient, procedure, and operative location.  A generous left thigh fishmouth incision was planned on the left lower extremity with a skin marker. A sterile tourniquet was applied to the proximal thigh. An esmarch  tourniquet was used to exsanguinate the leg. The pneumatic tourniquet was inflated. An incision was made as planned. The incision was carried down with a #21 scalpel through the subcutaneous tissue, and through the posterior and anterior compartments of the thigh until the femur was encountered. The periosteum about the femur was elevated as high as possible. The femur was transected with a Gigli saw. The vascular bundle was found in its typical position, and was suture-ligated proximally using a 2-0 silk suture.  The sciatic nerve was identified in typical position, retracted, transected at the retracted margin, and ligated with a 2-0 silk suture.  The tourniquet was released. Hemostasis was achieved in the surgical bed. The wound was copiously irrigated.  The wound was closed in layers using 2-0 Vicryl, and a surgical stapler.  A clean bandage was applied.  Upon completion of the case instrument and sharps counts were confirmed correct. The patient was transferred to the PACU in good condition. I was present for all portions of the procedure.  Jonathan [name]. Stanford Breed, MD [facility] Phone Number: [phone] 08/19/2021 12:10 PM

## 2021-08-19 NOTE — Transfer of Care (Signed)
Immediate Anesthesia Transfer of Care Note  Patient: Jonathan Dollar Sr.  Procedure(s) Performed: LEFT ABOVE KNEE AMPUTATION (Left: Knee) APPLICATION OF WOUND VAC (Left: Leg Upper)  Patient Location: PACU  Anesthesia Type:GA combined with regional for post-op pain  Level of Consciousness: awake and patient cooperative  Airway & Oxygen Therapy: Patient Spontanous Breathing and Patient connected to nasal cannula oxygen  Post-op Assessment: Report given to RN, Post -op Vital signs reviewed and stable and Patient moving all extremities X 4  Post vital signs: Reviewed and stable  Last Vitals:  Vitals Value Taken Time  BP 118/69 08/19/21 1222  Temp    Pulse 89 08/19/21 1225  Resp 15 08/19/21 1225  SpO2 100 % 08/19/21 1225  Vitals shown include unvalidated device data.  Last Pain:  Vitals:   08/19/21 1100  TempSrc:   PainSc: 0-No pain         Complications: No notable events documented.

## 2021-08-20 ENCOUNTER — Encounter (HOSPITAL_COMMUNITY): Payer: Self-pay | Admitting: Vascular Surgery

## 2021-08-20 DIAGNOSIS — Z89612 Acquired absence of left leg above knee: Secondary | ICD-10-CM

## 2021-08-20 LAB — GLUCOSE, CAPILLARY
Glucose-Capillary: 127 mg/dL — ABNORMAL HIGH (ref 70–99)
Glucose-Capillary: 176 mg/dL — ABNORMAL HIGH (ref 70–99)
Glucose-Capillary: 146 mg/dL — ABNORMAL HIGH (ref 70–99)

## 2021-08-20 LAB — BASIC METABOLIC PANEL
Anion gap: 11 (ref 5–15)
BUN: 35 mg/dL — ABNORMAL HIGH (ref 8–23)
CO2: 29 mmol/L (ref 22–32)
Calcium: 8.6 mg/dL — ABNORMAL LOW (ref 8.9–10.3)
Chloride: 94 mmol/L — ABNORMAL LOW (ref 98–111)
Creatinine, Ser: 5.94 mg/dL — ABNORMAL HIGH (ref 0.61–1.24)
GFR, Estimated: 10 mL/min — ABNORMAL LOW (ref >60)
Glucose, Bld: 123 mg/dL — ABNORMAL HIGH (ref 70–99)
Potassium: 4.4 mmol/L (ref 3.5–5.1)
Sodium: 134 mmol/L — ABNORMAL LOW (ref 135–145)

## 2021-08-20 LAB — CBC
HCT: 30 % — ABNORMAL LOW (ref 39.0–52.0)
Hemoglobin: 9.5 g/dL — ABNORMAL LOW (ref 13.0–17.0)
MCH: 29 pg (ref 26.0–34.0)
MCHC: 31.7 g/dL (ref 30.0–36.0)
MCV: 91.5 fL (ref 80.0–100.0)
Platelets: 126 10*3/uL — ABNORMAL LOW (ref 150–400)
RBC: 3.28 MIL/uL — ABNORMAL LOW (ref 4.22–5.81)
RDW: 19.5 % — ABNORMAL HIGH (ref 11.5–15.5)
WBC: 8 10*3/uL (ref 4.0–10.5)
nRBC: 0 % (ref 0.0–0.2)

## 2021-08-20 MED ORDER — DARBEPOETIN ALFA 100 MCG/0.5ML IJ SOSY: 100.0000 ug | PREFILLED_SYRINGE | INTRAMUSCULAR | Status: DC

## 2021-08-20 MED ORDER — DOXERCALCIFEROL 4 MCG/2ML IV SOLN
4.0000 ug | INTRAVENOUS | Status: DC
  Administered 2021-08-23 – 2021-08-27 (×3): 4 ug via INTRAVENOUS
  Filled 2021-08-20 (×7): qty 2

## 2021-08-20 MED ORDER — CHLORHEXIDINE GLUCONATE CLOTH 2 % EX PADS: 6.0000 | MEDICATED_PAD | Freq: Every day | CUTANEOUS | Status: DC

## 2021-08-20 MED ORDER — APIXABAN 5 MG PO TABS
5.0000 mg | ORAL_TABLET | Freq: Two times a day (BID) | ORAL | Status: DC
  Administered 2021-08-20 – 2021-08-28 (×17): 5 mg via ORAL
  Filled 2021-08-20 (×17): qty 1

## 2021-08-20 MED ORDER — LEVALBUTEROL HCL 0.63 MG/3ML IN NEBU: 0.6300 mg | INHALATION_SOLUTION | Freq: Three times a day (TID) | RESPIRATORY_TRACT | Status: DC | PRN

## 2021-08-20 MED ORDER — HEPARIN SODIUM (PORCINE) 1000 UNIT/ML DIALYSIS: 1000.0000 [IU] | INTRAMUSCULAR | Status: DC | PRN

## 2021-08-20 NOTE — Progress Notes (Addendum)
°  Progress Note    08/20/2021 9:33 AM 1 Day Post-Op  Subjective:  denies pain  Tm 99.3 now afebrile  Vitals:   08/20/21 0409 08/20/21 0900  BP: 120/66 (!) 141/73  Pulse: 89 97  Resp:  16  Temp: 99.3 F (37.4 C) (!) 97.4 F (36.3 C)  SpO2: 97% 96%    Physical Exam: Incisions:  stump shrinker in place    CBC    Component Value Date/Time   WBC 8.0 08/20/2021 0444   RBC 3.28 (L) 08/20/2021 0444   HGB 9.5 (L) 08/20/2021 0444   HCT 30.0 (L) 08/20/2021 0444   PLT 126 (L) 08/20/2021 0444   MCV 91.5 08/20/2021 0444   MCH 29.0 08/20/2021 0444   MCHC 31.7 08/20/2021 0444   RDW 19.5 (H) 08/20/2021 0444   LYMPHSABS 1.2 06/25/2021 0931   MONOABS 0.8 06/25/2021 0931   EOSABS 0.2 06/25/2021 0931   BASOSABS 0.0 06/25/2021 0931    BMET    Component Value Date/Time   NA 134 (L) 08/20/2021 0444   K 4.4 08/20/2021 0444   CL 94 (L) 08/20/2021 0444   CO2 29 08/20/2021 0444   GLUCOSE 123 (H) 08/20/2021 0444   BUN 35 (H) 08/20/2021 0444   CREATININE 5.94 (H) 08/20/2021 0444   CALCIUM 8.6 (L) 08/20/2021 0444   GFRNONAA 10 (L) 08/20/2021 0444   GFRAA 13 (L) 09/22/2019 0733    INR    Component Value Date/Time   INR 1.3 (H) 06/04/2021 0459     Intake/Output Summary (Last 24 hours) at 08/20/2021 0933 Last data filed at 08/19/2021 2000 Gross per 24 hour  Intake 590.17 ml  Output 20 ml  Net 570.17 ml     Assessment/Plan:  71 y.o. male is s/p left above knee amputation  1 Day Post-Op  -dressing in place with shrinker and pain controlled.      Leontine Locket, PA-C [facility] [phone] 08/20/2021 9:33 AM    VASCULAR STAFF ADDENDUM: I have independently interviewed and examined the patient. I agree with the above.  Mobilize with PT / OT / OOB. Keep dressing on for 7 days or until discharge - whichever comes first.  [name], MD [facility] Phone Number: [phone] 08/20/2021 10:44  AM

## 2021-08-20 NOTE — Progress Notes (Signed)
PROGRESS NOTE    Jonathan [name]  SWF:[id number] DOB: [birth year] DOA: 08/19/2021 PCP: Ginger Organ., MD   Brief Narrative:  HPI: Jonathan Lerew. is a 70 y.o. 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   Patient has a chronic left heel ulcer, and recently broke his knee and then mobility significantly deteriorated.  Vascular surgery offered left leg above-knee amputation.  Patient underwent left AKA today, tolerated procedure well, pain is controlled.  Received about 200-300 mL fluid during procedure, no complaint of shortness of breath or chest pains.  Assessment & Plan:   Principal Problem:   S/P AKA (above knee amputation) unilateral, left ([name]) Active Problems:   PVD (peripheral vascular disease) ([name])  Left leg nonhealing wound status post left AKA: Management per vascular surgery. PT OT recommends CIR.   Hyperkalemia: Resolved.  LV thrombosis: Resume Eliquis now that he is 24 hours post op.   Type 2 diabetes mellitus: Controlled on SSI.   ESRD on HD: Nephrology on board.  He is due for dialysis now.   Chronic steroid dependence: Continue steroid.  DVT prophylaxis: heparin injection 5,000 Units Start: 08/19/21 1700   Code Status: Full Code  Family Communication: Wife present at bedside.    Status is: Inpatient  Remains inpatient appropriate because: Needs CIR.   Estimated body mass index is 20.38 kg/m as calculated from the following:   Height as of this encounter: 5\' 9"  (1.753 m).   Weight as of this encounter: 62.6 kg.    Nutritional Assessment: Body mass index is 20.38 kg/m.Marland Kitchen Seen by dietician.  I agree with the assessment and plan as outlined below: Nutrition Status:         . Skin Assessment: I have examined the patient's skin and I agree with the wound assessment as performed by the wound care RN as outlined below:    Consultants:  Vascular surgery  Procedures:  As above  Antimicrobials:  Anti-infectives (From admission, onward)    Start     Dose/Rate Route Frequency Ordered Stop   08/19/21 0856  ceFAZolin (ANCEF) IVPB 2g/100 mL premix        2 g 200 mL/hr over 30 Minutes Intravenous 30 min pre-op 08/19/21 0856 08/19/21 1137         Subjective: Patient seen and examined.  Patient is verbal but his speech is slightly garbled and slightly hard to understand but not impossible.  He tells me that he is feeling fine and he looks comfortable.  Objective: Vitals:   08/19/21 2330 08/20/21 0409 08/20/21 0900 08/20/21 1133  BP: 126/63 120/66 (!) 141/73 135/72  Pulse: 91 89 97 97  Resp: 16  16 16   Temp: 98.6 F (37 C) 99.3 F (37.4 C) (!) 97.4 F (36.3 C) 98.6 F (37 C)  TempSrc: Oral Oral Oral Oral  SpO2: 99% 97% 96% 96%  Weight:      Height:        Intake/Output Summary (Last 24 hours) at 08/20/2021 1329 Last data filed at 08/19/2021 2000 Gross per 24 hour  Intake 190.17 ml  Output --  Net 190.17 ml   Filed Weights   08/18/21 0927 08/19/21 0857 08/19/21 1549  Weight: 62.6 kg 62.6 kg  62.6 kg    Examination:  General exam: Appears calm and comfortable  Respiratory system: Clear to auscultation. Respiratory effort normal. Cardiovascular system: S1 & S2 heard, RRR. No JVD, murmurs, rubs, gallops or clicks. No pedal edema. Gastrointestinal system: Abdomen is nondistended, soft and nontender. No organomegaly or masses felt. Normal bowel sounds heard. Central nervous system: Alert and oriented. No focal neurological deficits. Extremities: Left AKA and right BKA. Skin: No rashes, lesions or ulcers   Data Reviewed: I have personally reviewed following labs and imaging studies  CBC: Recent Labs  Lab 08/19/21 0925 08/20/21 0444   WBC  --  8.0  HGB 16.3 9.5*  HCT 48.0 30.0*  MCV  --  91.5  PLT  --  527*   Basic Metabolic Panel: Recent Labs  Lab 08/19/21 0925 08/20/21 0444  NA 136 134*  K 5.4* 4.4  CL 97* 94*  CO2  --  29  GLUCOSE 105* 123*  BUN 40* 35*  CREATININE 4.90* 5.94*  CALCIUM  --  8.6*   GFR: Estimated Creatinine Clearance: 10.5 mL/min (A) (by C-G formula based on SCr of 5.94 mg/dL (H)). Liver Function Tests: No results for input(s): AST, ALT, ALKPHOS, BILITOT, PROT, ALBUMIN in the last 168 hours. No results for input(s): LIPASE, AMYLASE in the last 168 hours. No results for input(s): AMMONIA in the last 168 hours. Coagulation Profile: No results for input(s): INR, PROTIME in the last 168 hours. Cardiac Enzymes: No results for input(s): CKTOTAL, CKMB, CKMBINDEX, TROPONINI in the last 168 hours. BNP (last 3 results) No results for input(s): PROBNP in the last 8760 hours. HbA1C: Recent Labs    08/19/21 1955  HGBA1C 4.9   CBG: Recent Labs  Lab 08/19/21 1225 08/19/21 1554 08/19/21 2122 08/20/21 0623 08/20/21 1132  GLUCAP 132* 114* 130* 127* 176*   Lipid Profile: No results for input(s): CHOL, HDL, LDLCALC, TRIG, CHOLHDL, LDLDIRECT in the last 72 hours. Thyroid Function Tests: No results for input(s): TSH, T4TOTAL, FREET4, T3FREE, THYROIDAB in the last 72 hours. Anemia Panel: No results for input(s): VITAMINB12, FOLATE, FERRITIN, TIBC, IRON, RETICCTPCT in the last 72 hours. Sepsis Labs: No results for input(s): PROCALCITON, LATICACIDVEN in the last 168 hours.  Recent Results (from the past 240 hour(s))  SARS Coronavirus 2 (TAT 6-24 hrs)     Status: None   Collection Time: 08/17/21 12:00 AM  Result Value Ref Range Status   SARS Coronavirus 2 RESULT: NEGATIVE  Final    Comment: RESULT: NEGATIVESARS-CoV-2 INTERPRETATION:A NEGATIVE  test result means that SARS-CoV-2 RNA was not present in the specimen above the limit of detection of this test. This does not preclude a possible  SARS-CoV-2 infection and should not be used as the  sole basis for patient management decisions. Negative results must be combined with clinical observations, patient history, and epidemiological information. Optimum specimen types and timing for peak viral levels during infections caused by SARS-CoV-2  have not been determined. Collection of multiple specimens or types of specimens may be necessary to detect virus. Improper specimen collection and handling, sequence variability under primers/probes, or organism present below the limit of detection may  lead to false negative results. Positive and negative predictive values of testing are highly dependent on prevalence. False negative test results are more likely when prevalence of disease is high.The expected result is NEGATIVE.Fact S heet for  Healthcare Providers: [email or web address].no Sheet for Patients: [email or web address].es Reference Range - Negative      Radiology Studies: DG Chest 1 View  Result  Date: 08/19/2021 CLINICAL DATA:  Shortness of breath, status post above knee amputation in the left lower extremity EXAM: CHEST  1 VIEW COMPARISON:  06/03/2021 FINDINGS: Transverse diameter of heart is increased. Central pulmonary vessels are more prominent. Increased interstitial and alveolar densities seen in both lungs, more so on the right side. There is blunting of right lateral CP angle. There is no pneumothorax. Vascular stents are noted in the right brachial and right axillary vessels. IMPRESSION: Cardiomegaly. There is interval worsening of pulmonary vascular congestion suggesting CHF. Increased interstitial and alveolar densities seen in both lungs, more so on the right side suggesting asymmetric pulmonary edema. Possibility of underlying pneumonia is not excluded. Electronically Signed   By: Elmer Picker M.D.   On: 08/19/2021 13:06    Scheduled Meds:  allopurinol  100 mg Oral QHS    aspirin  81 mg Oral Daily   atorvastatin  40 mg Oral QHS   folic acid  1 mg Oral Daily   heparin  5,000 Units Subcutaneous Q8H   insulin aspart  0-6 Units Subcutaneous TID WC   lidocaine-prilocaine  1 application Topical Once per day on Mon Wed Fri   predniSONE  10 mg Oral Q breakfast   sevelamer carbonate  800 mg Oral TID WC   sodium zirconium cyclosilicate  10 g Oral Once   Continuous Infusions:   LOS: 1 day   Time spent: 32 min   Darliss Cheney, MD [facility]  08/20/2021, 1:29 PM  Please page via [place] and do not message via secure chat for anything urgent. Secure chat can be used for anything non urgent.  How to contact the [facility] Attending or Consulting provider [place] or covering provider during after hours [place], for this patient?  Check the care team in [facility] and look for a) attending/consulting TR[facility] provider listed and b) the [facility] [address] team listed. Page or secure chat [phone]. Log into [email or web address] and use [facility]'s universal password to access. If you do not have the password, please contact the hospital operator. Locate the [facility] provider you are looking for under [facility] and page to a number that you can be directly reached. If you still have difficulty reaching the provider, please page the [facility] (Director on Call) for the Hospitalists listed on amion for assistance.

## 2021-08-20 NOTE — Progress Notes (Addendum)
Renal Service Consult Note [facility]  HANCEL ION Sr. 08/20/2021 Sol Blazing, MD Requesting Physician: Dr Doristine Bosworth  Reason for Consult: ESRD pt for elective AKA HPI: The patient is a 71 y.o. 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  Asked to see for dialysis.   Pt seen in room.  Minimal conversation, chronically confused.  Pleasant.     ROS - denies CP, no joint pain, no HA, no blurry vision, no rash, no diarrhea, no nausea/ vomiting, no dysuria, no difficulty voiding   Past Medical History  Past Medical History:  Diagnosis Date   CAD (coronary artery disease)    CHF (congestive heart failure) ([name])    Diabetes mellitus without complication ([place])    type 2   DM (diabetes mellitus) ([place])    ESRD (end stage renal disease) ([place])    dialysis Mon Wed Fri   FUO (fever of unknown origin) 09/17/2019   History of blood transfusion    Hyperlipidemia    Hypertension    Osteomyelitis ([place])    Peripheral arterial disease ([name])    PVD (peripheral vascular disease) ([place])    Renal disorder 2015   right kidney transplant   Stroke [facility])    Vascular dementia [facility])    Past Surgical History  Past Surgical History:  Procedure Laterality Date   ABDOMINAL AORTOGRAM N/A 08/05/2020   Procedure: ABDOMINAL AORTOGRAM;  Surgeon: [name], MD;  Location: [place] [facility];  Service: Cardiovascular;  Laterality: N/A;   ABDOMINAL AORTOGRAM W/LOWER EXTREMITY N/A 09/09/2020   Procedure: ABDOMINAL AORTOGRAM W/LOWER EXTREMITY;  Surgeon: [name], MD;  Location: [place] [facility];  Service: Cardiovascular;  Laterality: N/A;   AMPUTATION Right 08/06/2020   Procedure: Right Fifth Toe Ray Amputation;  Surgeon: [name], MD;  Location: [place];  Service: Vascular;  Laterality:  Right;   AMPUTATION Right 11/02/2020   Procedure: RIGHT AMPUTATION BELOW KNEE;  Surgeon: [name], MD;  Location: [place];  Service: Vascular;  Laterality: Right;   AMPUTATION Left 08/19/2021   Procedure: LEFT ABOVE KNEE AMPUTATION;  Surgeon: [name], MD;  Location: [employer];  Service: Vascular;  Laterality: Left;   APPLICATION OF WOUND VAC Left 08/19/2021   Procedure: APPLICATION OF WOUND VAC;  Surgeon: [name], MD;  Location: [place];  Service: Vascular;  Laterality: Left;   BACK SURGERY     Has had 2 back surgeries   BUBBLE STUDY  09/18/2019   Procedure: BUBBLE STUDY;  Surgeon: Buford Dresser, MD;  Location: [place];  Service: Cardiovascular;;   Depression     ENDARTERECTOMY FEMORAL Right 08/06/2020   Procedure: Right Ilio- Femoral Artery Endarterectomy;  Surgeon: [name], MD;  [facility] OR;  Service: Vascular;  Laterality: Right;   HAND SURGERY Right    HAND SURGERY     HIP ARTHROPLASTY Left 06/04/2021   Procedure: ARTHROPLASTY BIPOLAR HIP (HEMIARTHROPLASTY);  Surgeon: Willaim Sheng, MD;  Location: [place];  Service: Orthopedics;  Laterality: Left;   IR DIALY SHUNT INTRO NEEDLE/INTRACATH INITIAL W/IMG RIGHT Right 06/07/2021   IR REMOVAL TUN CV CATH W/O FL  09/03/2019   KIDNEY TRANSPLANT     November 2015   LEG AMPUTATION BELOW KNEE Right    LOWER EXTREMITY ANGIOGRAPHY  Bilateral 08/05/2020   Procedure: LOWER EXTREMITY ANGIOGRAPHY;  Surgeon: [name], MD;  Location: [place] [facility];  Service: Cardiovascular;  Laterality: Bilateral;   Memory loss     NEPHRECTOMY TRANSPLANTED ORGAN     PATCH ANGIOPLASTY Right 08/06/2020   Procedure: PATCH ANGIOPLASTY;  Surgeon: [name], MD;  Location[facility];  Service: Vascular;  Laterality: Right;   PERIPHERAL VASCULAR BALLOON ANGIOPLASTY Right 09/09/2020   Procedure: PERIPHERAL VASCULAR BALLOON ANGIOPLASTY;  Surgeon: [name], MD;  Location: [place] [facility];  Service:  Cardiovascular;  Laterality: Right;  popliteal   PERIPHERAL VASCULAR INTERVENTION Right 08/05/2020   Procedure: PERIPHERAL VASCULAR INTERVENTION;  Surgeon: [name], MD;  Location: [place] [facility];  Service: Cardiovascular;  Laterality: Right;  SFA   PERIPHERAL VASCULAR INTERVENTION Right 09/09/2020   Procedure: PERIPHERAL VASCULAR INTERVENTION;  Surgeon: [name], MD;  Location: [place] [facility];  Service: Cardiovascular;  Laterality: Right;  external iliac   TEE WITHOUT CARDIOVERSION N/A 09/18/2019   Procedure: TRANSESOPHAGEAL ECHOCARDIOGRAM (TEE);  Surgeon: Buford Dresser, MD;  Location: [facility] ENDOSCOPY;  Service: Cardiovascular;  Laterality: N/A;   WOUND DEBRIDEMENT Right 10/08/2020   Procedure: DEBRIDEMENT WOUND RIGHT FOOT;  Surgeon: [name], MD;  [facility] OR;  Service: Vascular;  Laterality: Right;   Family History  Family History  Problem Relation Age of Onset   Diabetes Mellitus II Mother    Cancer Mother        STOMACH   Diabetes Mellitus II Brother    Other Father        UNKOWN   Healthy Daughter    Diabetes Son    Social History  reports that he has quit smoking. His smoking use included cigarettes. He smoked an average of 1 pack per day. He has never used smokeless tobacco. He reports that he does not currently use alcohol. He reports that he does not use drugs. Allergies No Known Allergies Home medications Prior to Admission medications   Medication Sig Start Date End Date Taking? Authorizing Provider  allopurinol (ZYLOPRIM) 100 MG tablet Take 100 mg by mouth at bedtime. 06/09/14  Yes [provider]  apixaban (ELIQUIS) 5 MG TABS tablet Take 1 tablet (5 mg total) by mouth 2 (two) times daily. 06/23/21  Yes Love, Ivan Anchors, PA-C  aspirin 81 MG chewable tablet Chew 81 mg by mouth daily.  06/09/14  Yes [provider]  atorvastatin (LIPITOR) 40 MG tablet Take 40 mg by mouth at bedtime.   Yes [provider]  b  complex-vitamin c-folic acid (NEPHRO-VITE) 0.8 MG TABS tablet Take 1 tablet by mouth daily. 05/08/20  Yes [provider]  feeding supplement (BOOST HIGH PROTEIN) LIQD Take 1 Container by mouth daily as needed (nutrition).   Yes [provider]  folic acid (FOLVITE) 1 MG tablet Take 1 tablet (1 mg total) by mouth daily. 05/11/21  Yes Garvin Fila, MD  HYDROcodone-acetaminophen (NORCO/VICODIN) 5-325 MG tablet Take 1 tablet by mouth 2 (two) times daily as needed for moderate pain. 06/23/21  Yes Love, Ivan Anchors, PA-C  Inulin (FIBER CHOICE PO) Take 1 tablet by mouth daily.   Yes [provider]  lidocaine-prilocaine (EMLA) cream Apply 1 application topically 3 (three) times a week. 30 minutes prior to Dialysis - MWF 05/08/18  Yes [provider]  melatonin 5 MG TABS Take 1 tablet (5 mg total) by mouth at bedtime. Patient taking differently: Take 5 mg by mouth  at bedtime as needed (sleep). 06/23/21  Yes Love, Ivan Anchors, PA-C  Nystatin (GERHARDT'S BUTT CREAM) CREA Apply 1 application topically 4 (four) times daily. Mix desitin with antifugal cream and apply a layer to buttocks/scrotum. 06/25/21  Yes Love, Ivan Anchors, PA-C  polyethylene glycol (MIRALAX / GLYCOLAX) 17 g packet Take 17 g by mouth daily as needed for mild constipation. 06/10/21  Yes Dahal, Marlowe Aschoff, MD  predniSONE (DELTASONE) 5 MG tablet Take 5 mg by mouth daily with breakfast. 03/02/20  Yes [provider]  sevelamer carbonate (RENVELA) 800 MG tablet Take 1 tablet (800 mg total) by mouth 3 (three) times daily with meals. 06/25/21  Yes Love, Ivan Anchors, PA-C  witch hazel-glycerin (TUCKS) pad Apply topically as needed for itching. 06/25/21  Yes Love, Ivan Anchors, PA-C  acidophilus (RISAQUAD) CAPS capsule Take 2 capsules by mouth 3 (three) times daily. Patient not taking: Reported on 08/12/2021 06/23/21   Love, Ivan Anchors, PA-C  diphenoxylate-atropine (LOMOTIL) 2.5-0.025 MG tablet Take 1 tablet by mouth at  bedtime. Patient not taking: Reported on 08/12/2021 06/23/21   Love, Ivan Anchors, PA-C  hydrocortisone (ANUSOL-HC) 25 MG suppository Place 1 suppository (25 mg total) rectally 2 (two) times daily. Patient not taking: Reported on 08/12/2021 06/23/21   Love, Ivan Anchors, PA-C  insulin aspart (NOVOLOG) 100 UNIT/ML injection Inject 0-9 Units into the skin as needed for high blood sugar. 06/23/21   Love, Ivan Anchors, PA-C  Insulin Pen Needle 32G X 4 MM MISC 1 each by Other route as needed (insulin).  09/10/16   [provider]  levalbuterol Penne Lash HFA) 45 MCG/ACT inhaler Inhale 2 puffs into the lungs every 8 (eight) hours as needed for wheezing. 09/21/19   Raiford Noble Latif, DO  nystatin (MYCOSTATIN/NYSTOP) powder Apply topically 3 (three) times daily. Patient not taking: Reported on 08/12/2021 06/23/21   Love, Ivan Anchors, PA-C  nystatin cream (MYCOSTATIN) Apply topically 2 (two) times daily. Patient not taking: Reported on 08/12/2021 06/23/21   Bary Leriche, [place]     Vitals:   08/19/21 2017 08/19/21 2330 08/20/21 0409 08/20/21 0900  BP: 130/70 126/63 120/66 (!) 141/73  Pulse: 91 91 89 97  Resp: 20 16  16   Temp: 97.6 F (36.4 C) 98.6 F (37 C) 99.3 F (37.4 C) (!) 97.4 F (36.3 C)  TempSrc: Oral Oral Oral Oral  SpO2: 99% 99% 97% 96%  Weight:      Height:       Exam Gen alert, no distress, aphasia No rash, cyanosis or gangrene Sclera anicteric, throat clear  No jvd or bruits Chest clear bilat to bases, no rales/ wheezing RRR no MRG Abd soft ntnd no mass or ascites +bs GU defer MS new L AKA w/ VAC, old R BKA  Ext no LE or UE edema, no wounds or ulcers Neuro is alert, Ox 3 , nf  LUA AVF+bruit    Home meds include - eliquis, lipitor, asa, norco prn, prednisone 10 qd, renvela ac tid, novolog insulin, prns/ vits/ supps     OP HD: MWF [facility]  4h 17min  55kg  2/2 bath  Hep 3000  LU AVF   - hect 4 ug tiw   - mircera 150 q2, last 1/02 (due 1/16)     Assessment/ Plan: SP AKA 1/12 - per  [facility] ESRD - MWF HD. Labs are good. No vol excess. HD later today. HD time will be shortened due to high census/ staffing issues.  PAD hx L BKA SP CVA /  aphasia/ dementia HTN/ vol - BP's wnl, no meds, volume euvolemic H/o failed renal transplant - takes daily prednisone DM 2 - on insulin at home Anemia ckd - Hb 9.5, next esa due 1/16, ordered darbe 100 ug weekly on monday MBD ckd - cont vdra, cont binder, Ca in range      Kelly Splinter  MD 08/20/2021, 9:24 AM  Recent Labs  Lab 08/19/21 0925 08/20/21 0444  WBC  --  8.0  HGB 16.3 9.5*   Recent Labs  Lab 08/19/21 0925 08/20/21 0444  K 5.4* 4.4  BUN 40* 35*  CREATININE 4.90* 5.94*  CALCIUM  --  8.6*

## 2021-08-20 NOTE — Progress Notes (Signed)
Inpatient Rehab Admissions Coordinator Note:   Per therapy recommendations patient was screened for CIR candidacy by Michel Santee, PT. At this time, pt appears to be a potential candidate for CIR. I will place an order for rehab consult for full assessment, per our protocol.  Please contact me any with questions.Shann Medal, PT, DPT [phone] 08/20/21 1:55 PM

## 2021-08-20 NOTE — Evaluation (Addendum)
Occupational Therapy Evaluation Patient Details Name: Jonathan TIGGS Sr. MRN: [id number] DOB: 1950-08-29 Today's Date: 08/20/2021   History of Present Illness The pt is a 71 yo male presenting for elective L AKA for management of worsening L heel ulcer and after L hip fx requiring L TKA. PMH includes: ESRD on HD MWF s/p kidney transplant and subsequent failure, HTN, HLD, DM, PVD, and R BKA.   Clinical Impression   Patient is s/p L AKA surgery resulting in functional limitations due to the deficits listed below (see OT problem list). Pt lives at home with wife , son/ daughter in law and grandson. Pt was ambulatory prior to surgery and now wife needs (A) to learn how to decrease burden of care and pt train on R prosthetic limb. Recommend further education to wife about amputee community support groups.Wife reports pt is up in his wc everyday and eats every meal at a table. Wife does report due to difficulty with 1/2 bath door frame/ lack of training since hip fx they have just used depends.   Patient will benefit from skilled OT acutely to increase independence and safety with ADLS to allow discharge CIR.   *if CIR denies - will need extensive help at home for wife- such as hoyer lift, aide, wound RN, HHOT/PT to maximize care due to current change and need for additional training.      Recommendations for follow up therapy are one component of a multi-disciplinary discharge planning process, led by the attending physician.  Recommendations may be updated based on patient status, additional functional criteria and insurance authorization.   Follow Up Recommendations  Acute inpatient rehab (3hours/day)    Assistance Recommended at Discharge Intermittent Supervision/Assistance  Patient can return home with the following      Functional Status Assessment  Patient has had a recent decline in their functional status and demonstrates the ability to make significant improvements in function in a  reasonable and predictable amount of time.  Equipment Recommendations  Wheelchair (measurements OT);Wheelchair cushion (measurements OT);Hospital bed;Other (comment) (lift)    Recommendations for Other Services Rehab consult     Precautions / Restrictions Precautions Precautions: Fall Precaution Comments: new L AKA with chronic L BKA. Restrictions Weight Bearing Restrictions: Yes RLE Weight Bearing: Non weight bearing LLE Weight Bearing: Non weight bearing      Mobility Bed Mobility Overal bed mobility: Needs Assistance Bed Mobility: Rolling;Sidelying to Sit;Sit to Sidelying Rolling: Min assist Sidelying to sit: Min assist     Sit to sidelying: Min assist General bed mobility comments: cues for hand placement on bed rail, pad used to help angle toward L side of bed. pt able to pull on rail and turn head toward rail. pt able to push off with L UE. Pt told to reach with R UE and able to get to min guard static sitting. pt able to go down on L ue and come back to sitting. pt with cues for trunk elongation but able to complete. pt static sitting >5 minutes stable vss    Transfers                   General transfer comment: unable at this time, unsafe without +2      Balance Overall balance assessment: Needs assistance Sitting-balance support: Feet unsupported;Bilateral upper extremity supported Sitting balance-Leahy Scale: Fair   Postural control:  (forward lean)  ADL either performed or assessed with clinical judgement   ADL Overall ADL's : Needs assistance/impaired Eating/Feeding: Set up Eating/Feeding Details (indicate cue type and reason): bed level eating lunch - wife reports needs very chopped meals and if given the right texture can eat without assistance. if meals are not chopped well he will pocket food and further cues Grooming: Wash/dry face;Set up   Upper Body Bathing: Minimal assistance   Lower Body  Bathing: Maximal assistance   Upper Body Dressing : Minimal assistance   Lower Body Dressing: Maximal assistance                 General ADL Comments: OT evaluating patient bed level and HD staff arriving to transport     Vision   Additional Comments: able to scan outside central vision showing cognitive able to still attend to surrounds     Perception     Praxis      Pertinent Vitals/Pain Pain Assessment: No/denies pain     Hand Dominance Right   Extremity/Trunk Assessment Upper Extremity Assessment Upper Extremity Assessment: Generalized weakness   Lower Extremity Assessment Lower Extremity Assessment: Defer to PT evaluation RLE Deficits / Details: chronic R BKA with good ability to perform full ROM with muscle activation, low muscle bulk.   Cervical / Trunk Assessment Cervical / Trunk Assessment: Normal   Communication Communication Communication: Expressive difficulties   Cognition Arousal/Alertness: Awake/alert Behavior During Therapy: Flat affect Overall Cognitive Status: History of cognitive impairments - at baseline                                 General Comments: per wife, pt seems to be at cognitive baseline. was able to answer basic questions. asked patient what is yoru name Jonathan [name]. what is wifes name Jonathan [name] Comments  RA VSS    Exercises     Shoulder Instructions      Home Living Family/patient expects to be discharged to:: Private residence Living Arrangements: Children;Spouse/significant other (son, daughter in law, and grandson (16)) Available Help at Discharge: Family;Available 24 hours/day (wife works 3rd shift) Type of Home: House Home Access: Stairs to enter (has purchased small ramp but reports it is a little steep for step) Entrance Stairs-Number of Steps: 1 Entrance Stairs-Rails: None Home Layout: Two level;Bed/bath upstairs;1/2 bath on main level;Able to live on main level with  bedroom/bathroom Alternate Level Stairs-Number of Steps: 12 Alternate Level Stairs-Rails: Right Bathroom Shower/Tub: Sponge bathes at baseline (half bath only downstairs)   Bathroom Toilet: Standard     Home Equipment: Conservation officer, nature (2 wheels);Rollator (4 wheels);Cane - single point;Wheelchair - manual;BSC/3in1   Additional Comments: dialysis MWF - [facility] transportation      Prior Functioning/Environment Prior Level of Function : Needs assist  Cognitive Assist : ADLs (cognitive)   ADLs (Cognitive): Intermittent cues Physical Assist : Mobility (physical);ADLs (physical) Mobility (physical): Bed mobility;Transfers ADLs (physical): Bathing;Dressing;Toileting;IADLs;Grooming Mobility Comments: wife reports sometimes pt cooperates with slideboard, prior to hip fx the pt was ambulating 120 ft with prosthetic. performing sit-stand with supervision ADLs Comments: bathes in bed partially but that is due to bathroom setup on the main floor is half bath, then wife will do LB dressing in bed, then transfer to [facility] for remainder of bathing. has been doing transfers from [facility] to car.        OT Problem List: Impaired balance (sitting and/or standing);Decreased knowledge of precautions;Decreased safety awareness;Decreased  knowledge of use of DME or AE;Decreased cognition;Decreased coordination;Decreased activity tolerance;Decreased strength      OT Treatment/Interventions: Self-care/ADL training;Therapeutic exercise;Energy conservation;DME and/or AE instruction;Manual therapy;Modalities;Therapeutic activities;Balance training;Patient/family education;Cognitive remediation/compensation    OT Goals(Current goals can be found in the care plan section) Acute Rehab OT Goals Patient Stated Goal: to get him to doing things better for home OT Goal Formulation: With patient/family Time For Goal Achievement: 09/03/21 Potential to Achieve Goals: Good  OT Frequency: Min 3X/week    Co-evaluation               AM-PAC OT "6 Clicks" Daily Activity     Outcome Measure Help from another person eating meals?: None Help from another person taking care of personal grooming?: None Help from another person toileting, which includes using toliet, bedpan, or urinal?: A Lot Help from another person bathing (including washing, rinsing, drying)?: A Lot Help from another person to put on and taking off regular upper body clothing?: A Little Help from another person to put on and taking off regular lower body clothing?: A Lot 6 Click Score: 17   End of Session Nurse Communication: Mobility status;Precautions  Activity Tolerance: Patient tolerated treatment well Patient left: in bed;with call bell/phone within reach;with bed alarm set;with family/visitor present  OT Visit Diagnosis: Muscle weakness (generalized) (M62.81)                Time: 8721-5872 OT Time Calculation (min): 26 min Charges:  OT General Charges $OT Visit: 1 Visit OT Evaluation $OT Eval Moderate Complexity: 1 Mod   Brynn, OTR/L  Acute Rehabilitation Services Pager: [phone] Office: [phone] .   Jeri Modena 08/20/2021, 3:42 PM

## 2021-08-20 NOTE — Evaluation (Signed)
Physical Therapy Evaluation Patient Details Name: Jonathan [name]. MRN: [id number] DOB: Mar 10, 1951 Today's Date: 08/20/2021  History of Present Illness  The pt is a 71 yo male presenting for elective L AKA for management of worsening L heel ulcer and after L hip fx requiring L TKA. PMH includes: ESRD on HD MWF s/p kidney transplant and subsequent failure, HTN, HLD, DM, PVD, and R BKA.   Clinical Impression  Pt in bed upon arrival of PT, agreeable to evaluation at this time. Prior to fall in October of 2022, the pt was ambulating 140ft with use of RW and RLE prosthetic in OPPT and completing sit-stand transfers with supervision. Since that fall and subsequent L hip fx requiring sx, the pt has had rapid decrease in functional mobility and independence, requiring max-totalA from wife for OOB mobility using slide board for transfers and [facility] for mobility. The pt now presents with limitations in functional mobility, strength, motor coordination, seated stability, AROM of LLE, and strength due to above dx, and will continue to benefit from skilled PT to address these deficits. The pt and his wife are hopeful that he will progress to use of R prosthetic to allow for pt to assist with pivoting transfers to decrease burden of care on wife. Given his complicated PMH and current deficits, a short stint of inpatient rehab would facilitate maximal recovery and allow for the pt to return to greatest level of independence for d/c home with wife.   If unable to pursue acute inpatient rehab, the pt will need max HHPT in addition to hoyer lift and an aide.        Recommendations for follow up therapy are one component of a multi-disciplinary discharge planning process, led by the attending physician.  Recommendations may be updated based on patient status, additional functional criteria and insurance authorization.  Follow Up Recommendations Acute inpatient rehab (3hours/day)    Assistance Recommended at Discharge  Frequent or constant Supervision/Assistance  Patient can return home with the following  Two people to help with walking and/or transfers;Assistance with cooking/housework;Two people to help with bathing/dressing/bathroom;Direct supervision/assist for medications management;Direct supervision/assist for financial management;Assist for transportation;Help with stairs or ramp for entrance    Equipment Recommendations Hospital bed (hoyer lift, aide)  Recommendations for Other Services  OT consult;Rehab consult    Functional Status Assessment Patient has had a recent decline in their functional status and demonstrates the ability to make significant improvements in function in a reasonable and predictable amount of time.     Precautions / Restrictions Precautions Precautions: Fall Precaution Comments: new L AKA with chronic L BKA. Restrictions Weight Bearing Restrictions: Yes RLE Weight Bearing: Non weight bearing      Mobility  Bed Mobility Overal bed mobility: Needs Assistance Bed Mobility: Rolling;Sidelying to Sit;Sit to Sidelying Rolling: Mod assist Sidelying to sit: Mod assist     Sit to sidelying: Min assist General bed mobility comments: modA to assist with hip movement to EOB, VC to use hand rails. pt able to assist with UE support from therapist    Transfers                   General transfer comment: unable at this time, unsafe without +2           Balance Overall balance assessment: Needs assistance Sitting-balance support: Feet unsupported;Bilateral upper extremity supported Sitting balance-Leahy Scale: Poor Sitting balance - Comments: dependent on BUE support and at times minA to steady. pt with significant trunk  flexion in sitting, but can correct wiht verbal cues Postural control: Right lateral lean (forward lean)                                   Pertinent Vitals/Pain Pain Assessment: No/denies pain    Home Living Family/patient  expects to be discharged to:: Private residence Living Arrangements: Children;Spouse/significant other (son, daughter in law, and grandson (45)) Available Help at Discharge: Family;Available 24 hours/day (wife works 3rd shift) Type of Home: House Home Access: Stairs to enter (has purchased small ramp but reports it is a little steep for step) Entrance Stairs-Rails: None Entrance Stairs-Number of Steps: 1 Alternate Level Stairs-Number of Steps: 12 Home Layout: Two level;Bed/bath upstairs;1/2 bath on main level;Able to live on main level with bedroom/bathroom Home Equipment: Rolling Walker (2 wheels);Rollator (4 wheels);Cane - single point;Wheelchair - manual;BSC/3in1 Additional Comments: dialysis MWF - [facility] transportation    Prior Function Prior Level of Function : Needs assist       Physical Assist : Mobility (physical);ADLs (physical) Mobility (physical): Bed mobility;Transfers ADLs (physical): Bathing;Dressing;Toileting;IADLs;Grooming Mobility Comments: wife reports sometimes pt cooperates with slideboard, prior to hip fx the pt was ambulating 120 ft with prosthetic. performing sit-stand with supervision ADLs Comments: bathes in bed partially, then wife will do LB dressing in bed, then transfer to [facility] for remainder of bathing. has been doing transfers from [facility] to car.     Hand Dominance   Dominant Hand: Right    Extremity/Trunk Assessment   Upper Extremity Assessment Upper Extremity Assessment: RUE deficits/detail;LUE deficits/detail RUE Deficits / Details: grossly 4/5, following commands with increased time for MMT RUE Sensation: WNL RUE Coordination: WNL LUE Deficits / Details: grossly 4/5, following commands with increased time for MMT LUE Sensation: WNL LUE Coordination: WNL    Lower Extremity Assessment Lower Extremity Assessment: RLE deficits/detail;LLE deficits/detail RLE Deficits / Details: chronic R BKA with good ability to perform full ROM with muscle activation,  low muscle bulk. LLE Deficits / Details: new L AKA, unable to complete bip flexion or abduction due to pain LLE: Unable to fully assess due to pain    Cervical / Trunk Assessment Cervical / Trunk Assessment: Normal  Communication   Communication: Expressive difficulties  Cognition Arousal/Alertness: Awake/alert Behavior During Therapy: Flat affect Overall Cognitive Status: History of cognitive impairments - at baseline                                 General Comments: per wife, pt seems to be at cognitive baseline. was able to answer basic questions        General Comments General comments (skin integrity, edema, etc.): VSS on RA, wife present and supportive    Exercises     Assessment/Plan    PT Assessment Patient needs continued PT services  PT Problem List Decreased strength;Decreased range of motion;Decreased activity tolerance;Decreased mobility;Decreased balance;Decreased coordination;Decreased knowledge of use of DME;Pain       PT Treatment Interventions DME instruction;Gait training;Stair training;Functional mobility training;Therapeutic activities;Therapeutic exercise;Patient/family education;Balance training    PT Goals (Current goals can be found in the Care Plan section)  Acute Rehab PT Goals Patient Stated Goal: be able to pivot with R prosthetic to decrease burden for transfers (per wife) PT Goal Formulation: With patient/family Time For Goal Achievement: 09/03/21 Potential to Achieve Goals: Fair    Frequency Min 3X/week  AM-PAC PT "6 Clicks" Mobility  Outcome Measure Help needed turning from your back to your side while in a flat bed without using bedrails?: A Lot Help needed moving from lying on your back to sitting on the side of a flat bed without using bedrails?: A Lot Help needed moving to and from a bed to a chair (including a wheelchair)?: Total Help needed standing up from a chair using your arms (e.g., wheelchair or bedside  chair)?: Total Help needed to walk in hospital room?: Total Help needed climbing 3-5 steps with a railing? : Total 6 Click Score: 8    End of Session   Activity Tolerance: Patient tolerated treatment well;Patient limited by pain;Patient limited by fatigue Patient left: in bed;with call bell/phone within reach;with bed alarm set;with family/visitor present Nurse Communication: Mobility status PT Visit Diagnosis: Other abnormalities of gait and mobility (R26.89);Muscle weakness (generalized) (M62.81)    Time: 6789-3810 PT Time Calculation (min) (ACUTE ONLY): 26 min   Charges:   PT Evaluation $PT Eval Moderate Complexity: 1 Mod PT Treatments $Therapeutic Activity: 8-22 mins        [place] Carbo, PT, DPT   Acute Rehabilitation Department Pager #: [phone]  Sandra Cockayne 08/20/2021, 10:18 AM

## 2021-08-21 DIAGNOSIS — N186 End stage renal disease: Secondary | ICD-10-CM | POA: Diagnosis not present

## 2021-08-21 DIAGNOSIS — Z992 Dependence on renal dialysis: Secondary | ICD-10-CM | POA: Diagnosis not present

## 2021-08-21 DIAGNOSIS — Z89612 Acquired absence of left leg above knee: Secondary | ICD-10-CM | POA: Diagnosis not present

## 2021-08-21 LAB — GLUCOSE, CAPILLARY
Glucose-Capillary: 115 mg/dL — ABNORMAL HIGH (ref 70–99)
Glucose-Capillary: 175 mg/dL — ABNORMAL HIGH (ref 70–99)
Glucose-Capillary: 196 mg/dL — ABNORMAL HIGH (ref 70–99)
Glucose-Capillary: 151 mg/dL — ABNORMAL HIGH (ref 70–99)

## 2021-08-21 NOTE — Progress Notes (Signed)
Inpatient Rehab Admissions:  Inpatient Rehab Consult received.  I met with patient and wife at the bedside for rehabilitation assessment and to discuss goals and expectations of an inpatient rehab admission.  Both acknowledged understanding of CIR goals and expectations. Both interested in pt pursuing CIR. Wife confirmed she and son/daughter-in-law will be able to provide assistance/supervision to pt after discharge. Will continue to follow.   Signed: Gayland Curry, [place], [place] Admissions Coordinator [phone]

## 2021-08-21 NOTE — PMR Pre-admission (Shared)
PMR Admission Coordinator Pre-Admission Assessment  Patient: Jonathan [name]. is an 71 y.o., male MRN: [id number] DOB: 06/09/1951 Height: 5' 9"  (175.3 cm) Weight: 49.1 kg  Insurance Information HMO:     PPO: yes     PCP:      IPA:      80/20:      OTHER:  PRIMARY: BCBS Medicare      Policy#: [id number]      Subscriber: patient CM Name: [employer] [place]      Phone#: ***     Fax#: *** Pre-Cert#: ***      Employer: *** Benefits:  Phone #: ***     Name: *** Eff. Date: ***     Deduct: ***      Out of Pocket Max: ***      Life Max: *** CIR: ***      SNF: *** Outpatient: ***     Co-Pay: *** Home Health: ***      Co-Pay: *** DME: ***     Co-Pay: *** Providers: in-network SECONDARY:       Policy#:      Phone#:   Financial Counselor:       Phone#:   The [employer] for patients in Inpatient Rehabilitation Facilities with attached Privacy Act [place] Records was provided and verbally reviewed with: Family  Emergency Contact Information Contact Information     Name Relation Home Work Mobile   Jonathan, [name] Spouse   [phone]   Jonathan, [name] [phone]        Current Medical History  Patient Admitting Diagnosis: s/p L AKA History of Present Illness: Pt is a 71 year old male with medical hx significant for: ESRD on HD Monday, Wednesday, Friday 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 Pt presented to hospital on 08/19/21 for left above-knee amputation for nonsalvageable lower extremity heel ulcer with likely osteomyelitis. Therapy evaluations completed and CIR recommended d/t pt's deficits in functional mobility and ability to complete ADLs independently.     Patient's medical record from [facility] has been  reviewed by the rehabilitation admission coordinator and physician.  Past Medical History  Past Medical History:  Diagnosis Date   CAD (coronary artery disease)    CHF (congestive heart failure) ([name])    Diabetes mellitus without complication ([place])    type 2   DM (diabetes mellitus) ([place])    ESRD (end stage renal disease) ([place])    dialysis Mon Wed Fri   FUO (fever of unknown origin) 09/17/2019   History of blood transfusion    Hyperlipidemia    Hypertension    Osteomyelitis ([name])    Peripheral arterial disease ([name])    PVD (peripheral vascular disease) ([place])    Renal disorder 2015   right kidney transplant   Stroke [facility])    Vascular dementia ([place])     Has the patient had major surgery during 100 days prior to admission? Yes  Family History   family history includes Cancer in his mother; Diabetes in his son; Diabetes Mellitus II in his brother and mother; Healthy in his daughter; Other in his father.  Current Medications  Current Facility-Administered Medications:    allopurinol (ZYLOPRIM) tablet 100 mg, 100 mg, Oral, QHS, Wynetta Fines T, MD, 100 mg at 08/23/21 2237  apixaban (ELIQUIS) tablet 5 mg, 5 mg, Oral, BID, Pahwani, Ravi, MD, 5 mg at 08/24/21 0810   aspirin chewable tablet 81 mg, 81 mg, Oral, Daily, Wynetta Fines T, MD, 81 mg at 08/24/21 0810   atorvastatin (LIPITOR) tablet 40 mg, 40 mg, Oral, QHS, Wynetta Fines T, MD, 40 mg at 08/23/21 2237   Darbepoetin Alfa (ARANESP) injection 100 mcg, 100 mcg, Intravenous, Q Mon-HD, Kris Mouton, RPH, 100 mcg at 08/23/21 1349   doxercalciferol (HECTOROL) injection 4 mcg, 4 mcg, Intravenous, Q M,W,F-HD, Roney Jaffe, MD, 4 mcg at 41/93/79 0240   folic acid (FOLVITE) tablet 1 mg, 1 mg, Oral, Daily, Wynetta Fines T, MD, 1 mg at 08/24/21 0810   HYDROcodone-acetaminophen (NORCO/VICODIN) 5-325 MG per tablet 1 tablet, 1 tablet, Oral, BID PRN, Wynetta Fines T, MD, 1 tablet at 08/19/21 2210   insulin aspart (novoLOG) injection 0-6 Units, 0-6  Units, Subcutaneous, TID WC, Wynetta Fines T, MD, 2 Units at 08/22/21 1655   lactose free nutrition (BOOST PLUS) liquid 237 mL, 237 mL, Oral, Daily PRN, Reome, Earle J, RPH   levalbuterol (XOPENEX) nebulizer solution 0.63 mg, 0.63 mg, Inhalation, Q8H PRN, Pahwani, Ravi, MD   lidocaine-prilocaine (EMLA) cream 1 application, 1 application, Topical, Once per day on Mon Wed Fri, Zhang, Ping T, MD   melatonin tablet 5 mg, 5 mg, Oral, QHS PRN, Wynetta Fines T, MD, 5 mg at 08/19/21 2210   ondansetron (ZOFRAN) tablet 4 mg, 4 mg, Oral, Q6H PRN **OR** ondansetron (ZOFRAN) injection 4 mg, 4 mg, Intravenous, Q6H PRN, Wynetta Fines T, MD   polyethylene glycol (MIRALAX / GLYCOLAX) packet 17 g, 17 g, Oral, Daily PRN, Wynetta Fines T, MD, 17 g at 08/21/21 2147   predniSONE (DELTASONE) tablet 10 mg, 10 mg, Oral, Q breakfast, Wynetta Fines T, MD, 10 mg at 08/24/21 0810   sevelamer carbonate (RENVELA) tablet 800 mg, 800 mg, Oral, TID WC, Wynetta Fines T, MD, 800 mg at 08/23/21 1722  Patients Current Diet:  Diet Order             Diet renal/carb modified with fluid restriction Diet-HS Snack? Nothing; Fluid restriction: 1200 mL Fluid; Room service appropriate? Yes with Assist; Fluid consistency: Thin  Diet effective now                   Precautions / Restrictions Precautions Precautions: Fall Precaution Comments: new L AKA with chronic L BKA. Restrictions Weight Bearing Restrictions: Yes RLE Weight Bearing: Non weight bearing LLE Weight Bearing: Non weight bearing   Has the patient had 2 or more falls or a fall with injury in the past year? Yes  Prior Activity Level Community (5-7x/wk): goes to HD 3 days/week  Prior Functional Level Self Care: Did the patient need help bathing, dressing, using the toilet or eating? Needed some help  Indoor Mobility: Did the patient need assistance with walking from room to room (with or without device)? Dependent (was learning how to use prosthetic)  Stairs: Did the  patient need assistance with internal or external stairs (with or without device)? Dependent (pt does not utilize steps)  Functional Cognition: Did the patient need help planning regular tasks such as shopping or remembering to take medications? Needed some help  Patient Information Are you of Hispanic, Latino/a,or Spanish origin?: A. No, not of Hispanic, Latino/a, or Spanish origin What is your race?: B. Black or African American Do you need or want an interpreter to communicate with a doctor or health care staff?: 0.  No  Patient's Response To:  Health Literacy and Transportation Is the patient able to respond to health literacy and transportation needs?: Yes Health Literacy - How often do you need to have someone help you when you read instructions, pamphlets, or other written material from your doctor or pharmacy?: Often In the past 12 months, has lack of transportation kept you from medical appointments or from getting medications?: No In the past 12 months, has lack of transportation kept you from meetings, work, or from getting things needed for daily living?: No  Development worker, international aid / [place] Devices/Equipment: [facility] bed, Bedside commode/3-in-1, Shower chair with back, CBG Meter, Blood pressure cuff Home Equipment: Conservation officer, nature (2 wheels), Rollator (4 wheels), Cane - single point, Wheelchair - manual, BSC/3in1  Prior Device Use: Indicate devices/aids used by the patient prior to current illness, exacerbation or injury? Walker and Orthotics/Prosthetics  Current Functional Level Cognition  Overall Cognitive Status: History of cognitive impairments - at baseline Orientation Level: Oriented X4 General Comments: per wife, pt seems to be at cognitive baseline. was able to answer basic questions.    Extremity Assessment (includes Sensation/Coordination)  Upper Extremity Assessment: Generalized weakness RUE Deficits / Details: grossly 4/5, following  commands with increased time for MMT RUE Sensation: WNL RUE Coordination: WNL LUE Deficits / Details: grossly 4/5, following commands with increased time for MMT LUE Sensation: WNL LUE Coordination: WNL  Lower Extremity Assessment: Defer to PT evaluation RLE Deficits / Details: chronic R BKA with good ability to perform full ROM with muscle activation, low muscle bulk. LLE Deficits / Details: new L AKA, unable to complete bip flexion or abduction due to pain LLE: Unable to fully assess due to pain    ADLs  Overall ADL's : Needs assistance/impaired Eating/Feeding: Set up Eating/Feeding Details (indicate cue type and reason): bed level eating lunch - wife reports needs very chopped meals and if given the right texture can eat without assistance. if meals are not chopped well he will pocket food and further cues Grooming: Wash/dry face, Set up Upper Body Bathing: Minimal assistance Lower Body Bathing: Maximal assistance Upper Body Dressing : Minimal assistance Lower Body Dressing: Maximal assistance General ADL Comments: worked on bed mobility and sitting balance initially in unsupported sitting then provided support to try to work on coming out of trunk flexion. BUE and trunk flexed position. Mod A for supine<>EOB. Pt able to scoot up in bed in supine with multimodal cues for hand placement on head board rail.    Mobility  Overal bed mobility: Needs Assistance Bed Mobility: Rolling, Supine to Sit, Sit to Supine Rolling: Min assist Sidelying to sit: Min assist Supine to sit: Mod assist, HOB elevated Sit to supine: Mod assist Sit to sidelying: Min assist General bed mobility comments: truncal assist and assist to pivot hips to full position. pt utilized bed rail, OT utilized bed pad.    Transfers  General transfer comment: worked on unupported sitting tolerance and positioning in preparation for attempting lateral scoot. Will need +2 assist and drop arm/flat surface.    Ambulation /  Gait / Stairs / Office manager / Balance Dynamic Sitting Balance Sitting balance - Comments: Able to sit EOB in supported sittign about 5 minutes then supported sitting another 3 minutes. BUE support in static sitting. Worked on coming out of trunk flexed position. Balance Overall balance assessment: Needs assistance Sitting-balance support: Feet unsupported, Bilateral upper extremity supported Sitting balance-Leahy Scale: Fair  Sitting balance - Comments: Able to sit EOB in supported sittign about 5 minutes then supported sitting another 3 minutes. BUE support in static sitting. Worked on coming out of trunk flexed position. Postural control:  (forward lean)    Special needs/care consideration Dialysis: Hemodialysis Monday, Wednesday, and Friday, Wound Vac Left leg, Skin Surgical incision: leg/left, and Diabetic management novoLOG 0-6 units 3x daily with meals   Previous Home Environment (from acute therapy documentation) Living Arrangements: Spouse/significant other, Children  Lives With: Spouse, Son (daughter-in-law) Available Help at Discharge: Family, Available 24 hours/day Type of Home: House Home Layout: Two level, 1/2 bath on main level, Able to live on main level with bedroom/bathroom Alternate Level Stairs-Rails: Right Alternate Level Stairs-Number of Steps: 12 Home Access: Stairs to enter Entrance Stairs-Rails: None Entrance Stairs-Number of Steps: 1 Bathroom Shower/Tub: Sponge bathes at baseline [employer]: Standard Bathroom Accessibility: Yes How Accessible: Accessible via walker [place]: No (recieved HH in March 2022) Additional Comments: dialysis MWF - [facility] transportation  Discharge Living Setting Plans for Discharge Living Setting: Patient's home Type of Home at Discharge: House Discharge Home Layout: Two level, Able to live on main level with bedroom/bathroom, 1/2 bath on main level Discharge Home Access: Stairs to enter Entrance  Stairs-Rails: None Entrance Stairs-Number of Steps: 1 Discharge Bathroom Shower/Tub: Other (comment) (sponge baths) Discharge Bathroom Toilet: Standard Discharge Bathroom Accessibility: Yes How Accessible: Accessible via walker Does the patient have any problems obtaining your medications?: No  Social/Family/Support Systems Patient Roles: Spouse Anticipated Caregiver: Jonathan [name], wife; Jonathan [name], son Anticipated Caregiver's Contact Information: [name]: [phone] Caregiver Availability: 24/7 Discharge Plan Discussed with Primary Caregiver: Yes Is Caregiver In Agreement with Plan?: Yes Does Caregiver/Family have Issues with Lodging/Transportation while Pt is in Rehab?: No  Goals Patient/Family Goal for Rehab: Supervision-Min A: PT/OT Expected length of stay: 12-14 days Pt/Family Agrees to Admission and willing to participate: Yes Program Orientation Provided & Reviewed with Pt/Caregiver Including Roles  & Responsibilities: Yes  Decrease burden of Care through IP rehab admission: NA  Possible need for SNF placement upon discharge: Not anticipated  Patient Condition: I have reviewed medical records from [facility], spoken with CM, and patient and spouse. I met with patient at the bedside for inpatient rehabilitation assessment.  Patient will benefit from ongoing PT and OT, can actively participate in 3 hours of therapy a day 5 days of the week, and can make measurable gains during the admission.  Patient will also benefit from the coordinated team approach during an Inpatient Acute Rehabilitation admission.  The patient will receive intensive therapy as well as Rehabilitation physician, nursing, social worker, and care management interventions.  Due to safety, skin/wound care, disease management, medication administration, pain management, and patient education the patient requires 24 hour a day rehabilitation nursing.  The patient is currently *** with mobility and basic  ADLs.  Discharge setting and therapy post discharge at home with home health is anticipated.  Patient has agreed to participate in the Acute Inpatient Rehabilitation Program and will admit today.  Preadmission Screen Completed By:  Bethel Born, 08/24/2021 10:54 AM ______________________________________________________________________   Discussed status with Dr. Marland Kitchen on *** at *** and received approval for admission today.  Admission Coordinator:  Bethel Born, CCC-SLP, time ***/Date ***   Assessment/Plan: Diagnosis: Does the need for close, 24 hr/day Medical supervision in concert with the patient's rehab needs make it unreasonable for this patient to be served in a less intensive setting? {yes_no_potentially:3041433}  Co-Morbidities requiring supervision/potential complications: *** Due to {due CW:1969409}, does the patient require 24 hr/day rehab nursing? {yes_no_potentially:3041433} Does the patient require coordinated care of a physician, rehab nurse, PT, OT, and SLP to address physical and functional deficits in the context of the above medical diagnosis(es)? {yes_no_potentially:3041433} Addressing deficits in the following areas: {deficits:3041436} Can the patient actively participate in an intensive therapy program of at least 3 hrs of therapy 5 days a week? {yes_no_potentially:3041433} The potential for patient to make measurable gains while on inpatient rehab is {potential:3041437} Anticipated functional outcomes upon discharge from inpatient rehab: {functional outcomes:304600100} PT, {functional outcomes:304600100} OT, {functional outcomes:304600100} SLP Estimated rehab length of stay to reach the above functional goals is: *** Anticipated discharge destination: {anticipated dc setting:21604} 10. Overall Rehab/Functional Prognosis: {potential:3041437}   MD Signature: ***

## 2021-08-21 NOTE — Consult Note (Signed)
Renal Service Consult Note [facility]  Jonathan HORTON Sr. 08/20/2021 Sol Blazing, MD Requesting Physician: Dr Doristine Bosworth  Reason for Consult: ESRD pt for elective AKA HPI: The patient is a 71 y.o. 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  Asked to see for dialysis.   Pt seen in room.  Minimal conversation, chronically confused.  Pleasant.     ROS - denies CP, no joint pain, no HA, no blurry vision, no rash, no diarrhea, no nausea/ vomiting, no dysuria, no difficulty voiding   Past Medical History  Past Medical History:  Diagnosis Date   CAD (coronary artery disease)    CHF (congestive heart failure) ([name])    Diabetes mellitus without complication ([place])    type 2   DM (diabetes mellitus) ([place])    ESRD (end stage renal disease) ([place])    dialysis Mon Wed Fri   FUO (fever of unknown origin) 09/17/2019   History of blood transfusion    Hyperlipidemia    Hypertension    Osteomyelitis ([place])    Peripheral arterial disease ([name])    PVD (peripheral vascular disease) ([place])    Renal disorder 2015   right kidney transplant   Stroke [facility])    Vascular dementia [facility])    Past Surgical History  Past Surgical History:  Procedure Laterality Date   ABDOMINAL AORTOGRAM N/A 08/05/2020   Procedure: ABDOMINAL AORTOGRAM;  Surgeon: [name], MD;  Location: [place] CV LAB;  Service: Cardiovascular;  Laterality: N/A;   ABDOMINAL AORTOGRAM W/LOWER EXTREMITY N/A 09/09/2020   Procedure: ABDOMINAL AORTOGRAM W/LOWER EXTREMITY;  Surgeon: [name], MD;  Location: [place] CV LAB;  Service: Cardiovascular;  Laterality: N/A;   AMPUTATION Right 08/06/2020   Procedure: Right Fifth Toe Ray Amputation;  Surgeon: [name], MD;  Location: [place];  Service: Vascular;  Laterality:  Right;   AMPUTATION Right 11/02/2020   Procedure: RIGHT AMPUTATION BELOW KNEE;  Surgeon: [name], MD;  Location: [place];  Service: Vascular;  Laterality: Right;   AMPUTATION Left 08/19/2021   Procedure: LEFT ABOVE KNEE AMPUTATION;  Surgeon: [name], MD;  Location: [place];  Service: Vascular;  Laterality: Left;   APPLICATION OF WOUND VAC Left 08/19/2021   Procedure: APPLICATION OF WOUND VAC;  Surgeon: [name], MD;  Location: [place];  Service: Vascular;  Laterality: Left;   BACK SURGERY     Has had 2 back surgeries   BUBBLE STUDY  09/18/2019   Procedure: BUBBLE STUDY;  Surgeon: Buford Dresser, MD;  Location: [place];  Service: Cardiovascular;;   Depression     ENDARTERECTOMY FEMORAL Right 08/06/2020   Procedure: Right Ilio- Femoral Artery Endarterectomy;  Surgeon: [name], MD;  Location: [facility] OR;  Service: Vascular;  Laterality: Right;   HAND SURGERY Right    HAND SURGERY     HIP ARTHROPLASTY Left 06/04/2021   Procedure: ARTHROPLASTY BIPOLAR HIP (HEMIARTHROPLASTY);  Surgeon: Willaim Sheng, MD;  Location: [place];  Service: Orthopedics;  Laterality: Left;   IR DIALY SHUNT INTRO NEEDLE/INTRACATH INITIAL W/IMG RIGHT Right 06/07/2021   IR REMOVAL TUN CV CATH W/O FL  09/03/2019   KIDNEY TRANSPLANT     November 2015   LEG AMPUTATION BELOW KNEE Right    LOWER EXTREMITY ANGIOGRAPHY  Bilateral 08/05/2020   Procedure: LOWER EXTREMITY ANGIOGRAPHY;  Surgeon: [name], MD;  Location: [place] CV LAB;  Service: Cardiovascular;  Laterality: Bilateral;   Memory loss     NEPHRECTOMY TRANSPLANTED ORGAN     PATCH ANGIOPLASTY Right 08/06/2020   Procedure: PATCH ANGIOPLASTY;  Surgeon: [name], MD;  Location: [facility] OR;  Service: Vascular;  Laterality: Right;   PERIPHERAL VASCULAR BALLOON ANGIOPLASTY Right 09/09/2020   Procedure: PERIPHERAL VASCULAR BALLOON ANGIOPLASTY;  Surgeon: [name], MD;  Location: [place] CV LAB;  Service:  Cardiovascular;  Laterality: Right;  popliteal   PERIPHERAL VASCULAR INTERVENTION Right 08/05/2020   Procedure: PERIPHERAL VASCULAR INTERVENTION;  Surgeon: [name], MD;  Location: [place] CV LAB;  Service: Cardiovascular;  Laterality: Right;  SFA   PERIPHERAL VASCULAR INTERVENTION Right 09/09/2020   Procedure: PERIPHERAL VASCULAR INTERVENTION;  Surgeon: [name], MD;  Location: [place] CV LAB;  Service: Cardiovascular;  Laterality: Right;  external iliac   TEE WITHOUT CARDIOVERSION N/A 09/18/2019   Procedure: TRANSESOPHAGEAL ECHOCARDIOGRAM (TEE);  Surgeon: Buford Dresser, MD;  Location: [facility] END[facility]COPY;  Service: Cardiovascular;  Laterality: N/A;   WOUND DEBRIDEMENT Right 10/08/2020   Procedure: DEBRIDEMENT WOUND RIGHT FOOT;  Surgeon: [name], MD;  Location: [facility] OR;  Service: Vascular;  Laterality: Right;   Family History  Family History  Problem Relation Age of Onset   Diabetes Mellitus II Mother    Cancer Mother        STOMACH   Diabetes Mellitus II Brother    Other Father        UNKOWN   Healthy Daughter    Diabetes Son    Social History  reports that he has quit smoking. His smoking use included cigarettes. He smoked an average of 1 pack per day. He has never used smokeless tobacco. He reports that he does not currently use alcohol. He reports that he does not use drugs. Allergies No Known Allergies Home medications Prior to Admission medications   Medication Sig Start Date End Date Taking? Authorizing Provider  allopurinol (ZYLOPRIM) 100 MG tablet Take 100 mg by mouth at bedtime. 06/09/14  Yes [provider]  apixaban (ELIQUIS) 5 MG TABS tablet Take 1 tablet (5 mg total) by mouth 2 (two) times daily. 06/23/21  Yes Love, Ivan Anchors, PA-C  aspirin 81 MG chewable tablet Chew 81 mg by mouth daily.  06/09/14  Yes [provider]  atorvastatin (LIPITOR) 40 MG tablet Take 40 mg by mouth at bedtime.   Yes [provider]  b  complex-vitamin c-folic acid (NEPHRO-VITE) 0.8 MG TABS tablet Take 1 tablet by mouth daily. 05/08/20  Yes [provider]  feeding supplement (BOOST HIGH PROTEIN) LIQD Take 1 Container by mouth daily as needed (nutrition).   Yes [provider]  folic acid (FOLVITE) 1 MG tablet Take 1 tablet (1 mg total) by mouth daily. 05/11/21  Yes Garvin Fila, MD  HYDROcodone-acetaminophen (NORCO/VICODIN) 5-325 MG tablet Take 1 tablet by mouth 2 (two) times daily as needed for moderate pain. 06/23/21  Yes Love, Ivan Anchors, PA-C  Inulin (FIBER CHOICE PO) Take 1 tablet by mouth daily.   Yes [provider]  lidocaine-prilocaine (EMLA) cream Apply 1 application topically 3 (three) times a week. 30 minutes prior to Dialysis - MWF 05/08/18  Yes [provider]  melatonin 5 MG TABS Take 1 tablet (5 mg total) by mouth at bedtime. Patient taking differently: Take 5 mg by mouth  at bedtime as needed (sleep). 06/23/21  Yes Love, Ivan Anchors, PA-C  Nystatin (GERHARDT'S BUTT CREAM) CREA Apply 1 application topically 4 (four) times daily. Mix desitin with antifugal cream and apply a layer to buttocks/scrotum. 06/25/21  Yes Love, Ivan Anchors, PA-C  polyethylene glycol (MIRALAX / GLYCOLAX) 17 g packet Take 17 g by mouth daily as needed for mild constipation. 06/10/21  Yes Dahal, Marlowe Aschoff, MD  predniSONE (DELTASONE) 5 MG tablet Take 5 mg by mouth daily with breakfast. 03/02/20  Yes [provider]  sevelamer carbonate (RENVELA) 800 MG tablet Take 1 tablet (800 mg total) by mouth 3 (three) times daily with meals. 06/25/21  Yes Love, Ivan Anchors, PA-C  witch hazel-glycerin (TUCKS) pad Apply topically as needed for itching. 06/25/21  Yes Love, Ivan Anchors, PA-C  acidophilus (RISAQUAD) CAPS capsule Take 2 capsules by mouth 3 (three) times daily. Patient not taking: Reported on 08/12/2021 06/23/21   Love, Ivan Anchors, PA-C  diphenoxylate-atropine (LOMOTIL) 2.5-0.025 MG tablet Take 1 tablet by mouth at  bedtime. Patient not taking: Reported on 08/12/2021 06/23/21   Love, Ivan Anchors, PA-C  hydrocortisone (ANUSOL-HC) 25 MG suppository Place 1 suppository (25 mg total) rectally 2 (two) times daily. Patient not taking: Reported on 08/12/2021 06/23/21   Love, Ivan Anchors, PA-C  insulin aspart (NOVOLOG) 100 UNIT/ML injection Inject 0-9 Units into the skin as needed for high blood sugar. 06/23/21   Love, Ivan Anchors, PA-C  Insulin Pen Needle 32G X 4 MM MISC 1 each by Other route as needed (insulin).  09/10/16   [provider]  levalbuterol Penne Lash HFA) 45 MCG/ACT inhaler Inhale 2 puffs into the lungs every 8 (eight) hours as needed for wheezing. 09/21/19   Raiford Noble Latif, DO  nystatin (MYCOSTATIN/NYSTOP) powder Apply topically 3 (three) times daily. Patient not taking: Reported on 08/12/2021 06/23/21   Love, Ivan Anchors, PA-C  nystatin cream (MYCOSTATIN) Apply topically 2 (two) times daily. Patient not taking: Reported on 08/12/2021 06/23/21   Bary Leriche, [place]     Vitals:   08/19/21 2017 08/19/21 2330 08/20/21 0409 08/20/21 0900  BP: 130/70 126/63 120/66 (!) 141/73  Pulse: 91 91 89 97  Resp: 20 16  16   Temp: 97.6 F (36.4 C) 98.6 F (37 C) 99.3 F (37.4 C) (!) 97.4 F (36.3 C)  TempSrc: Oral Oral Oral Oral  SpO2: 99% 99% 97% 96%  Weight:      Height:       Exam Gen alert, no distress, aphasia No rash, cyanosis or gangrene Sclera anicteric, throat clear  No jvd or bruits Chest clear bilat to bases, no rales/ wheezing RRR no MRG Abd soft ntnd no mass or ascites +bs GU defer MS new L AKA w/ VAC, old R BKA  Ext no LE or UE edema, no wounds or ulcers Neuro is alert, Ox 3 , nf  LUA AVF+bruit    Home meds include - eliquis, lipitor, asa, norco prn, prednisone 10 qd, renvela ac tid, novolog insulin, prns/ vits/ supps     OP HD: MWF [facility]  4h 32min  55kg  2/2 bath  Hep 3000  LU AVF   - hect 4 ug tiw   - mircera 150 q2, last 1/02 (due 1/16)     Assessment/ Plan: SP AKA 1/12 - per  [facility] ESRD - MWF HD. Had HD here yest afternoon, 2.8 L off. Next HD monday.  PAD hx L BKA SP CVA / aphasia/ dementia HTN/ vol - BP's wnl, euvolemic.  7kg under dry wt, losing body wt.  H/o failed renal transplant - takes daily prednisone DM 2 - on insulin at home Anemia ckd - Hb 9.5, next esa due 1/16, ordered darbe 100 ug weekly on monday MBD ckd - cont vdra, cont binder, Ca in range      Kelly Splinter  MD 08/20/2021, 9:24 AM  Recent Labs  Lab 08/19/21 0925 08/20/21 0444  WBC  --  8.0  HGB 16.3 9.5*   Recent Labs  Lab 08/19/21 0925 08/20/21 0444  K 5.4* 4.4  BUN 40* 35*  CREATININE 4.90* 5.94*  CALCIUM  --  8.6*

## 2021-08-21 NOTE — Progress Notes (Signed)
[facility] [place] PROGRESS NOTE  ASSESSMENT / PLAN: Jonathan [name]. is a 71 y.o. male status post left above knee amputation 08/19/2021.  Continue Prevena VAC to amputation incision for 7 days or just prior to discharge, whichever comes first.  Mobilize with PT/OT.  We will see him again Monday.  SUBJECTIVE: No complaints.  Eating breakfast.  Pain well controlled  OBJECTIVE: BP 134/62 (BP Location: Left Arm)    Pulse 98    Temp 99.1 F (37.3 C) (Oral)    Resp 15    Ht 5\' 9"  (1.753 m)    Wt 47.4 kg    SpO2 98%    BMI 15.43 kg/m   Intake/Output Summary (Last 24 hours) at 08/21/2021 0948 Last data filed at 08/21/2021 0600 Gross per 24 hour  Intake 120 ml  Output 2900 ml  Net -2780 ml   No acute distress Prevena dressing to left above-knee amputation with good seal  CBC Latest Ref Rng & Units 08/20/2021 08/19/2021 06/25/2021  WBC 4.0 - 10.5 K/uL 8.0 - 7.2  Hemoglobin 13.0 - 17.0 g/dL 9.5(L) 16.3 9.6(L)  Hematocrit 39.0 - 52.0 % 30.0(L) 48.0 30.7(L)  Platelets 150 - 400 K/uL 126(L) - 282     CMP Latest Ref Rng & Units 08/20/2021 08/19/2021 06/16/2021  Glucose 70 - 99 mg/dL 123(H) 105(H) 135(H)  BUN 8 - 23 mg/dL 35(H) 40(H) 76(H)  Creatinine 0.61 - 1.24 mg/dL 5.94(H) 4.90(H) 7.37(H)  Sodium 135 - 145 mmol/L 134(L) 136 131(L)  Potassium 3.5 - 5.1 mmol/L 4.4 5.4(H) 4.8  Chloride 98 - 111 mmol/L 94(L) 97(L) 92(L)  CO2 22 - 32 mmol/L 29 - 25  Calcium 8.9 - 10.3 mg/dL 8.6(L) - 8.5(L)  Total Protein 6.5 - 8.1 g/dL - - -  Total Bilirubin 0.3 - 1.2 mg/dL - - -  Alkaline Phos 38 - 126 U/L - - -  AST 15 - 41 U/L - - -  ALT 0 - 44 U/L - - -    Estimated Creatinine Clearance: 8 mL/min (A) (by C-G formula based on SCr of 5.94 mg/dL (H)).  Jonathan [name]. Jonathan Breed, MD [facility] Phone Number: [phone] 08/21/2021 9:48 AM

## 2021-08-21 NOTE — Progress Notes (Signed)
Patient ID: Jonathan [name]., male   DOB: 1951-07-10, 71 y.o.   MRN: [id number]  PROGRESS NOTE    Jonathan [name]  CVE:[id number] DOB: 1951/05/16 DOA: 08/19/2021 PCP: Ginger Organ., MD   Brief Narrative:  71 y.o. male with medical history significant of ESRD on HD Monday Wednesday Friday s/p kidney transplant and transplant failure on chronic steroid, chronic systolic CHF LVEF 30 to 02%, LV thrombosis on Eliquis, HTN, HLD, IDDM, PVD status post right BKA, stroke with residual aphasia and chronic left leg nonhealing wound presented to hospital for elective left AKA.  Nephrology and vascular surgery were consulted.  He underwent left AKA on 08/19/2021.  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 -PT/OT recommend CIR placement.  CIR consulted. -Continue pain management.  Fall precautions.  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  Allises as per nephrology schedule -Currently still on oral prednisone.  LV thrombosis  -Eliquis has been already resumed  Hyperkalemia -Resolved.    Diabetes mellitus type 2 -A1c 4.9.  Continue CBGs with SSI  Hyperlipidemia -Continue statin  Anemia of chronic disease -Possibly from renal failure.  Monitor  Hyponatremia -Mild.  Being managed by dialysis  Thrombocytopenia -Questionable cause.  No signs of bleeding.  Monitor intermittently  History of unspecified stroke with residual aphasia -Continue aspirin, statin and Eliquis  Generalized deconditioning -Overall prognosis is guarded to poor.  Recommend outpatient evaluation and follow-up by palliative care for goals of care discussion  DVT prophylaxis: Eliquis Code Status: Full Family Communication: Wife at bedside on 08/21/2021 Disposition Plan: Status is: Inpatient  Remains inpatient appropriate because: Of need for CIR  placement  Consultants: Vascular surgery/nephrology  Procedures: Left AKA on 08/19/2020  Antimicrobials:  Anti-infectives (From admission, onward)    Start     Dose/Rate Route Frequency Ordered Stop   08/19/21 0856  ceFAZolin (ANCEF) IVPB 2g/100 mL premix        2 g 200 mL/hr over 30 Minutes Intravenous 30 min pre-op 08/19/21 0856 08/19/21 1137        Subjective: Patient seen and examined at bedside.  No overnight fever, vomiting, worsening shortness of breath reported.  Objective: Vitals:   08/21/21 0022 08/21/21 0222 08/21/21 0422 08/21/21 0756  BP:   124/67 134/62  Pulse:  95 96 98  Resp: 20 20 17 15   Temp:   99.4 F (37.4 C) 99.1 F (37.3 C)  TempSrc:   Oral Oral  SpO2:  (!) 81% 100% 98%  Weight:      Height:        Intake/Output Summary (Last 24 hours) at 08/21/2021 1032 Last data filed at 08/21/2021 0900 Gross per 24 hour  Intake 360 ml  Output 2900 ml  Net -2540 ml   Filed Weights   08/19/21 1549 08/20/21 1509 08/20/21 1803  Weight: 62.6 kg 50 kg 47.4 kg    Examination:  General exam: Appears calm and comfortable.  Currently on room air. Respiratory system: Bilateral decreased breath sounds at bases with scattered crackles Cardiovascular system: S1 & S2 heard, Rate controlled Gastrointestinal system: Abdomen is distended slightly; soft and nontender.  Bowel sounds are heard  extremities: Left AKA with dressing and right BKA present Central nervous system: Awake; slow to respond; answers some questions; speech is still slightly garbled.  No focal neurological deficits. Moving extremities Skin: No obvious ecchymosis/lesions Psychiatry:  Affect is mostly flat   Data Reviewed: I have personally reviewed following labs and imaging studies  CBC: Recent Labs  Lab 08/19/21 0925 08/20/21 0444  WBC  --  8.0  HGB 16.3 9.5*  HCT 48.0 30.0*  MCV  --  91.5  PLT  --  540*   Basic Metabolic Panel: Recent Labs  Lab 08/19/21 0925 08/20/21 0444  NA 136  134*  K 5.4* 4.4  CL 97* 94*  CO2  --  29  GLUCOSE 105* 123*  BUN 40* 35*  CREATININE 4.90* 5.94*  CALCIUM  --  8.6*   GFR: Estimated Creatinine Clearance: 8 mL/min (A) (by C-G formula based on SCr of 5.94 mg/dL (H)). Liver Function Tests: No results for input(s): AST, ALT, ALKPHOS, BILITOT, PROT, ALBUMIN in the last 168 hours. No results for input(s): LIPASE, AMYLASE in the last 168 hours. No results for input(s): AMMONIA in the last 168 hours. Coagulation Profile: No results for input(s): INR, PROTIME in the last 168 hours. Cardiac Enzymes: No results for input(s): CKTOTAL, CKMB, CKMBINDEX, TROPONINI in the last 168 hours. BNP (last 3 results) No results for input(s): PROBNP in the last 8760 hours. HbA1C: Recent Labs    08/19/21 1955  HGBA1C 4.9   CBG: Recent Labs  Lab 08/19/21 2122 08/20/21 0623 08/20/21 1132 08/20/21 2132 08/21/21 0613  GLUCAP 130* 127* 176* 146* 115*   Lipid Profile: No results for input(s): CHOL, HDL, LDLCALC, TRIG, CHOLHDL, LDLDIRECT in the last 72 hours. Thyroid Function Tests: No results for input(s): TSH, T4TOTAL, FREET4, T3FREE, THYROIDAB in the last 72 hours. Anemia Panel: No results for input(s): VITAMINB12, FOLATE, FERRITIN, TIBC, IRON, RETICCTPCT in the last 72 hours. Sepsis Labs: No results for input(s): PROCALCITON, LATICACIDVEN in the last 168 hours.  Recent Results (from the past 240 hour(s))  SARS Coronavirus 2 (TAT 6-24 hrs)     Status: None   Collection Time: 08/17/21 12:00 AM  Result Value Ref Range Status   SARS Coronavirus 2 RESULT: NEGATIVE  Final    Comment: RESULT: NEGATIVESARS-CoV-2 INTERPRETATION:A NEGATIVE  test result means that SARS-CoV-2 RNA was not present in the specimen above the limit of detection of this test. This does not preclude a possible SARS-CoV-2 infection and should not be used as the  sole basis for patient management decisions. Negative results must be combined with clinical observations, patient  history, and epidemiological information. Optimum specimen types and timing for peak viral levels during infections caused by SARS-CoV-2  have not been determined. Collection of multiple specimens or types of specimens may be necessary to detect virus. Improper specimen collection and handling, sequence variability under primers/probes, or organism present below the limit of detection may  lead to false negative results. Positive and negative predictive values of testing are highly dependent on prevalence. False negative test results are more likely when prevalence of disease is high.The expected result is NEGATIVE.Fact S heet for  Healthcare Providers: LocalChronicle.no Sheet for Patients: [email or web address].es Reference Range - Negative          Radiology Studies: DG Chest 1 View  Result Date: 08/19/2021 CLINICAL DATA:  Shortness of breath, status post above knee amputation in the left lower extremity EXAM: CHEST  1 VIEW COMPARISON:  06/03/2021 FINDINGS: Transverse diameter of heart is increased. Central pulmonary vessels are more prominent. Increased interstitial and alveolar densities seen in both lungs, more so on the right side. There is blunting of right lateral CP angle. There is no pneumothorax. Vascular stents are noted  in the right brachial and right axillary vessels. IMPRESSION: Cardiomegaly. There is interval worsening of pulmonary vascular congestion suggesting CHF. Increased interstitial and alveolar densities seen in both lungs, more so on the right side suggesting asymmetric pulmonary edema. Possibility of underlying pneumonia is not excluded. Electronically Signed   By: Elmer Picker M.D.   On: 08/19/2021 13:06        Scheduled Meds:  allopurinol  100 mg Oral QHS   apixaban  5 mg Oral BID   aspirin  81 mg Oral Daily   atorvastatin  40 mg Oral QHS   [START ON 08/23/2021] darbepoetin (ARANESP) injection - NON-DIALYSIS   100 mcg Subcutaneous Q Mon-1800   [START ON 08/23/2021] doxercalciferol  4 mcg Intravenous Q M,W,F-HD   folic acid  1 mg Oral Daily   insulin aspart  0-6 Units Subcutaneous TID WC   lidocaine-prilocaine  1 application Topical Once per day on Mon Wed Fri   predniSONE  10 mg Oral Q breakfast   sevelamer carbonate  800 mg Oral TID WC   sodium zirconium cyclosilicate  10 g Oral Once   Continuous Infusions:        Aline August, MD [facility] 08/21/2021, 10:32 AM

## 2021-08-22 DIAGNOSIS — Z992 Dependence on renal dialysis: Secondary | ICD-10-CM | POA: Diagnosis not present

## 2021-08-22 DIAGNOSIS — Z89612 Acquired absence of left leg above knee: Secondary | ICD-10-CM | POA: Diagnosis not present

## 2021-08-22 DIAGNOSIS — N186 End stage renal disease: Secondary | ICD-10-CM | POA: Diagnosis not present

## 2021-08-22 LAB — GLUCOSE, CAPILLARY
Glucose-Capillary: 108 mg/dL — ABNORMAL HIGH (ref 70–99)
Glucose-Capillary: 150 mg/dL — ABNORMAL HIGH (ref 70–99)
Glucose-Capillary: 231 mg/dL — ABNORMAL HIGH (ref 70–99)
Glucose-Capillary: 235 mg/dL — ABNORMAL HIGH (ref 70–99)

## 2021-08-22 NOTE — Progress Notes (Addendum)
[facility] Progress Note  Subjective: no c/o today  Vitals:   08/21/21 2356 08/22/21 0413 08/22/21 0754 08/22/21 1203  BP: 125/77 117/69 121/62 123/70  Pulse: 92 90 93 98  Resp: 20 14 20 20   Temp: 98.3 F (36.8 C) 98.6 F (37 C) 98.5 F (36.9 C) 98.2 F (36.8 C)  TempSrc: Oral Oral Oral Oral  SpO2: 100% 98% 98% 99%  Weight:      Height:        Exam:  alert, nad , nonverbal for the most part  Nontoxic in appearance  no jvd  Chest cta bilat  Cor reg no RG  Abd soft ntnd no ascites   MS new L AKA w/ VAC, old R BKA    Ext no LE or UE edema, no wounds or ulcers   Neuro is alert, nonfocal, aphasic  LUA AVF+bruit       Home meds include - eliquis, lipitor, asa, norco prn, prednisone 10 qd, renvela ac tid, novolog insulin, prns/ vits/ supps        OP HD: MWF [facility]  4h 97min  55kg  2/2 bath  Hep 3000  LU AVF   - hect 4 ug tiw   - mircera 150 q2, last 1/02 (due 1/16)       Assessment/ Plan: SP AKA 1/12 - per [facility] ESRD - MWF HD. HD today.  HTN/ vol - BP's good, euvolemic. 5kg under Anemia ckd - Hb 9.5, next esa due 1/16, ordered darbe 100 ug weekly on Monday MBD ckd - cont vdra, cont binder, Ca in range H/o failed renal transplant - takes daily prednisone DM 2 - on insulin at home PAD hx L BKA SP CVA / aphasia/ dementia    Jonathan [name] 08/21/2021, 2:01 PM   Recent Labs  Lab 08/19/21 0925 08/20/21 0444  K 5.4* 4.4  BUN 40* 35*  CREATININE 4.90* 5.94*  CALCIUM  --  8.6*  HGB 16.3 9.5*   Inpatient medications:  allopurinol  100 mg Oral QHS   apixaban  5 mg Oral BID   aspirin  81 mg Oral Daily   atorvastatin  40 mg Oral QHS   [START ON 08/23/2021] darbepoetin (ARANESP) injection - NON-DIALYSIS  100 mcg Subcutaneous Q Mon-1800   [START ON 08/23/2021] doxercalciferol  4 mcg Intravenous Q M,W,F-HD   folic acid  1 mg Oral Daily   insulin aspart  0-6 Units Subcutaneous TID WC   lidocaine-prilocaine  1 application Topical Once per day on Mon Wed  Fri   predniSONE  10 mg Oral Q breakfast   sevelamer carbonate  800 mg Oral TID WC   sodium zirconium cyclosilicate  10 g Oral Once    HYDROcodone-acetaminophen, lactose free nutrition, levalbuterol, melatonin, ondansetron **OR** ondansetron (ZOFRAN) IV, polyethylene glycol

## 2021-08-22 NOTE — Progress Notes (Signed)
[facility] Progress Note  Subjective: no c/o today  Vitals:   08/21/21 2356 08/22/21 0413 08/22/21 0754 08/22/21 1203  BP: 125/77 117/69 121/62 123/70  Pulse: 92 90 93 98  Resp: 20 14 20 20   Temp: 98.3 F (36.8 C) 98.6 F (37 C) 98.5 F (36.9 C) 98.2 F (36.8 C)  TempSrc: Oral Oral Oral Oral  SpO2: 100% 98% 98% 99%  Weight:      Height:        Exam:  alert, nad , nonverbal for the most part  Nontoxic in appearance  no jvd  Chest cta bilat  Cor reg no RG  Abd soft ntnd no ascites   MS new L AKA w/ VAC, old R BKA    Ext no LE or UE edema, no wounds or ulcers   Neuro is alert, nonfocal, aphasic  LUA AVF+bruit       Home meds include - eliquis, lipitor, asa, norco prn, prednisone 10 qd, renvela ac tid, novolog insulin, prns/ vits/ supps        OP HD: MWF [facility]  4h 58min  55kg  2/2 bath  Hep 3000  LU AVF   - hect 4 ug tiw   - mircera 150 q2, last 1/02 (due 1/16)       Assessment/ Plan: SP AKA 1/12 - per [facility] ESRD - MWF HD. HD tomorrow. HTN/ vol - BP's good, euvolemic. 7kg under, lost body wt Anemia ckd - Hb 9.5, next esa due 1/16, ordered darbe 100 ug weekly on Monday MBD ckd - cont vdra, cont binder, Ca in range H/o failed renal transplant - takes daily prednisone DM 2 - on insulin at home PAD hx L BKA SP CVA / aphasia/ dementia    Rob Anniemae Haberkorn 08/21/2021, 2:13 PM   Recent Labs  Lab 08/19/21 0925 08/20/21 0444  K 5.4* 4.4  BUN 40* 35*  CREATININE 4.90* 5.94*  CALCIUM  --  8.6*  HGB 16.3 9.5*    Inpatient medications:  allopurinol  100 mg Oral QHS   apixaban  5 mg Oral BID   aspirin  81 mg Oral Daily   atorvastatin  40 mg Oral QHS   [START ON 08/23/2021] darbepoetin (ARANESP) injection - NON-DIALYSIS  100 mcg Subcutaneous Q Mon-1800   [START ON 08/23/2021] doxercalciferol  4 mcg Intravenous Q M,W,F-HD   folic acid  1 mg Oral Daily   insulin aspart  0-6 Units Subcutaneous TID WC   lidocaine-prilocaine  1 application Topical Once  per day on Mon Wed Fri   predniSONE  10 mg Oral Q breakfast   sevelamer carbonate  800 mg Oral TID WC   sodium zirconium cyclosilicate  10 g Oral Once    HYDROcodone-acetaminophen, lactose free nutrition, levalbuterol, melatonin, ondansetron **OR** ondansetron (ZOFRAN) IV, polyethylene glycol

## 2021-08-22 NOTE — Progress Notes (Signed)
Patient ID: Jonathan [name]., male   DOB: 03-Sep-1950, 71 y.o.   MRN: [id number]  PROGRESS NOTE    Reubin Bushnell  OFB:[id number] DOB: June 30, 1951 DOA: 08/19/2021 PCP: Ginger Organ., MD   Brief Narrative:  71 y.o. 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  Nephrology and vascular surgery were consulted.  He underwent left AKA on 08/19/2021.  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 -PT/OT recommend CIR placement.  CIR consulted. -Continue pain management.  Fall precautions.  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  Hemodialysis as per nephrology schedule -Currently still on oral prednisone.  LV thrombosis  -Eliquis has been already resumed  Hyperkalemia -Resolved.    Diabetes mellitus type 2 -A1c 4.9.  Continue CBGs with SSI  Hyperlipidemia -Continue statin  Anemia of chronic disease -Possibly from renal failure.  Monitor  Hyponatremia -Mild.  Being managed by dialysis  Thrombocytopenia -Questionable cause.  No signs of bleeding.  Monitor intermittently  History of unspecified stroke with residual aphasia -Continue aspirin, statin and Eliquis  Generalized deconditioning -Overall prognosis is guarded to poor.  Recommend outpatient evaluation and follow-up by palliative care for goals of care discussion  DVT prophylaxis: Eliquis Code Status: Full Family Communication: Wife at bedside on 08/21/2021 Disposition Plan: Status is: Inpatient  Remains inpatient appropriate because: Of need for  CIR placement  Consultants: Vascular surgery/nephrology  Procedures: Left AKA on 08/19/2020  Antimicrobials:  Anti-infectives (From admission, onward)    Start     Dose/Rate Route Frequency Ordered Stop   08/19/21 0856  ceFAZolin (ANCEF) IVPB 2g/100 mL premix        2 g 200 mL/hr over 30 Minutes Intravenous 30 min pre-op 08/19/21 0856 08/19/21 1137        Subjective: Patient seen and examined at bedside.  No chest pain or worsening shortness breath, fever or agitation reported. Objective: Vitals:   08/21/21 2009 08/21/21 2356 08/22/21 0413 08/22/21 0754  BP: (!) 118/98 125/77 117/69 121/62  Pulse: 92 92 90 93  Resp: 20 20 14 20   Temp: 98.6 F (37 C) 98.3 F (36.8 C) 98.6 F (37 C) 98.5 F (36.9 C)  TempSrc: Oral Oral Oral Oral  SpO2: 98% 100% 98% 98%  Weight:      Height:        Intake/Output Summary (Last 24 hours) at 08/22/2021 0759 Last data filed at 08/21/2021 0900 Gross per 24 hour  Intake 240 ml  Output --  Net 240 ml    Filed Weights   08/19/21 1549 08/20/21 1509 08/20/21 1803  Weight: 62.6 kg 50 kg 47.4 kg    Examination:  General exam: No distress.  On room air currently.  Poor historian.  Does not participate in conversation much.  Speech is still slightly garbled.  Affect is still very flat. Respiratory system: Decreased breath sounds at bases bilaterally with some crackles  cardiovascular system: Rate controlled; S1-S2 heard gastrointestinal system: Abdomen is mildly distended; soft and nontender.  Normal bowel sounds heard  extremities: Left AKA with dressing and right BKA present  Data Reviewed: I have personally reviewed following labs and imaging studies  CBC: Recent Labs  Lab 08/19/21 0925 08/20/21 0444  WBC  --  8.0  HGB 16.3 9.5*  HCT 48.0 30.0*  MCV  --  91.5  PLT  --  126*    Basic Metabolic Panel: Recent Labs  Lab 08/19/21 0925 08/20/21 0444  NA 136 134*  K 5.4* 4.4  CL 97* 94*  CO2  --  29  GLUCOSE 105* 123*  BUN 40*  35*  CREATININE 4.90* 5.94*  CALCIUM  --  8.6*    GFR: Estimated Creatinine Clearance: 8 mL/min (A) (by C-G formula based on SCr of 5.94 mg/dL (H)). Liver Function Tests: No results for input(s): AST, ALT, ALKPHOS, BILITOT, PROT, ALBUMIN in the last 168 hours. No results for input(s): LIPASE, AMYLASE in the last 168 hours. No results for input(s): AMMONIA in the last 168 hours. Coagulation Profile: No results for input(s): INR, PROTIME in the last 168 hours. Cardiac Enzymes: No results for input(s): CKTOTAL, CKMB, CKMBINDEX, TROPONINI in the last 168 hours. BNP (last 3 results) No results for input(s): PROBNP in the last 8760 hours. HbA1C: Recent Labs    08/19/21 1955  HGBA1C 4.9    CBG: Recent Labs  Lab 08/21/21 0613 08/21/21 1157 08/21/21 1607 08/21/21 2113 08/22/21 0634  GLUCAP 115* 175* 196* 151* 108*    Lipid Profile: No results for input(s): CHOL, HDL, LDLCALC, TRIG, CHOLHDL, LDLDIRECT in the last 72 hours. Thyroid Function Tests: No results for input(s): TSH, T4TOTAL, FREET4, T3FREE, THYROIDAB in the last 72 hours. Anemia Panel: No results for input(s): VITAMINB12, FOLATE, FERRITIN, TIBC, IRON, RETICCTPCT in the last 72 hours. Sepsis Labs: No results for input(s): PROCALCITON, LATICACIDVEN in the last 168 hours.  Recent Results (from the past 240 hour(s))  SARS Coronavirus 2 (TAT 6-24 hrs)     Status: None   Collection Time: 08/17/21 12:00 AM  Result Value Ref Range Status   SARS Coronavirus 2 RESULT: NEGATIVE  Final    Comment: RESULT: NEGATIVESARS-CoV-2 INTERPRETATION:A NEGATIVE  test result means that SARS-CoV-2 RNA was not present in the specimen above the limit of detection of this test. This does not preclude a possible SARS-CoV-2 infection and should not be used as the  sole basis for patient management decisions. Negative results must be combined with clinical observations, patient history, and epidemiological information. Optimum specimen types and  timing for peak viral levels during infections caused by SARS-CoV-2  have not been determined. Collection of multiple specimens or types of specimens may be necessary to detect virus. Improper specimen collection and handling, sequence variability under primers/probes, or organism present below the limit of detection may  lead to false negative results. Positive and negative predictive values of testing are highly dependent on prevalence. False negative test results are more likely when prevalence of disease is high.The expected result is NEGATIVE.Fact S heet for  Healthcare Providers: LocalChronicle.no Sheet for Patients: SalonLookup.es Reference Range - Negative           Radiology Studies: No results found.      Scheduled Meds:  allopurinol  100 mg Oral QHS   apixaban  5 mg Oral BID   aspirin  81 mg Oral Daily   atorvastatin  40 mg Oral QHS   [START ON 08/23/2021] darbepoetin (ARANESP) injection - NON-DIALYSIS  100 mcg Subcutaneous Q Mon-1800   [START ON 08/23/2021] doxercalciferol  4 mcg Intravenous Q M,W,F-HD   folic acid  1 mg Oral  Daily   insulin aspart  0-6 Units Subcutaneous TID WC   lidocaine-prilocaine  1 application Topical Once per day on Mon Wed Fri   predniSONE  10 mg Oral Q breakfast   sevelamer carbonate  800 mg Oral TID WC   sodium zirconium cyclosilicate  10 g Oral Once   Continuous Infusions:        Aline August, MD [facility] 08/22/2021, 7:59 AM

## 2021-08-23 DIAGNOSIS — I739 Peripheral vascular disease, unspecified: Secondary | ICD-10-CM

## 2021-08-23 DIAGNOSIS — N186 End stage renal disease: Secondary | ICD-10-CM | POA: Diagnosis not present

## 2021-08-23 DIAGNOSIS — Z89612 Acquired absence of left leg above knee: Secondary | ICD-10-CM | POA: Diagnosis not present

## 2021-08-23 DIAGNOSIS — Z992 Dependence on renal dialysis: Secondary | ICD-10-CM | POA: Diagnosis not present

## 2021-08-23 LAB — COMPREHENSIVE METABOLIC PANEL
ALT: <5 U/L (ref 0–44)
AST: 13 U/L — ABNORMAL LOW (ref 15–41)
Albumin: 2.4 g/dL — ABNORMAL LOW (ref 3.5–5.0)
Alkaline Phosphatase: 46 U/L (ref 38–126)
Anion gap: 10 (ref 5–15)
BUN: 65 mg/dL — ABNORMAL HIGH (ref 8–23)
CO2: 26 mmol/L (ref 22–32)
Calcium: 8.5 mg/dL — ABNORMAL LOW (ref 8.9–10.3)
Chloride: 94 mmol/L — ABNORMAL LOW (ref 98–111)
Creatinine, Ser: 7.93 mg/dL — ABNORMAL HIGH (ref 0.61–1.24)
GFR, Estimated: 7 mL/min — ABNORMAL LOW (ref >60)
Glucose, Bld: 145 mg/dL — ABNORMAL HIGH (ref 70–99)
Potassium: 5.6 mmol/L — ABNORMAL HIGH (ref 3.5–5.1)
Sodium: 130 mmol/L — ABNORMAL LOW (ref 135–145)
Total Bilirubin: 0.3 mg/dL (ref 0.3–1.2)
Total Protein: 5.6 g/dL — ABNORMAL LOW (ref 6.5–8.1)

## 2021-08-23 LAB — BASIC METABOLIC PANEL
Anion gap: 13 (ref 5–15)
BUN: 43 mg/dL — ABNORMAL HIGH (ref 8–23)
CO2: 27 mmol/L (ref 22–32)
Calcium: 8.1 mg/dL — ABNORMAL LOW (ref 8.9–10.3)
Chloride: 94 mmol/L — ABNORMAL LOW (ref 98–111)
Creatinine, Ser: 5.58 mg/dL — ABNORMAL HIGH (ref 0.61–1.24)
GFR, Estimated: 10 mL/min — ABNORMAL LOW (ref >60)
Glucose, Bld: 243 mg/dL — ABNORMAL HIGH (ref 70–99)
Potassium: 4.7 mmol/L (ref 3.5–5.1)
Sodium: 134 mmol/L — ABNORMAL LOW (ref 135–145)

## 2021-08-23 LAB — SURGICAL PATHOLOGY

## 2021-08-23 LAB — HEPATITIS B SURFACE ANTIGEN: Hepatitis B Surface Ag: NONREACTIVE

## 2021-08-23 LAB — GLUCOSE, CAPILLARY
Glucose-Capillary: 113 mg/dL — ABNORMAL HIGH (ref 70–99)
Glucose-Capillary: 173 mg/dL — ABNORMAL HIGH (ref 70–99)
Glucose-Capillary: 214 mg/dL — ABNORMAL HIGH (ref 70–99)

## 2021-08-23 LAB — HEPATITIS B SURFACE ANTIBODY,QUALITATIVE: Hep B S Ab: NONREACTIVE

## 2021-08-23 LAB — MAGNESIUM: Magnesium: 2.1 mg/dL (ref 1.7–2.4)

## 2021-08-23 LAB — PHOSPHORUS: Phosphorus: 5.4 mg/dL — ABNORMAL HIGH (ref 2.5–4.6)

## 2021-08-23 MED ORDER — DARBEPOETIN ALFA 100 MCG/0.5ML IJ SOSY
100.0000 ug | PREFILLED_SYRINGE | INTRAMUSCULAR | Status: DC
  Administered 2021-08-23: 100 ug via INTRAVENOUS
  Filled 2021-08-23 (×2): qty 0.5

## 2021-08-23 MED ORDER — HEPARIN SODIUM (PORCINE) 1000 UNIT/ML DIALYSIS
1200.0000 [IU] | INTRAMUSCULAR | Status: DC | PRN
  Administered 2021-08-23: 1200 [IU] via INTRAVENOUS_CENTRAL
  Filled 2021-08-23 (×2): qty 2

## 2021-08-23 NOTE — Progress Notes (Signed)
Physical Therapy Treatment Patient Details Name: Jonathan JUHNKE Sr. MRN: [id number] DOB: 04-19-1951 Today's Date: 08/23/2021   History of Present Illness The pt is a 71 yo male presenting for elective L AKA for management of worsening L heel ulcer and after L hip fx requiring L TKA. PMH includes: ESRD on HD MWF s/p kidney transplant and subsequent failure, HTN, HLD, DM, PVD, and R BKA.    PT Comments    Pt flat and in need of consistent direction/redirection throughout session.  Emphasis on warm up amputee exercise with concentric and eccentric work bil graded resistance on the R LE and A/AAROM on the L LE, transitions to sitting, work on scooting forward and back with bil UE assist and external support and balance at EOB with/without UE assist.  Wife assisted during the session.     Recommendations for follow up therapy are one component of a multi-disciplinary discharge planning process, led by the attending physician.  Recommendations may be updated based on patient status, additional functional criteria and insurance authorization.  Follow Up Recommendations  Acute inpatient rehab (3hours/day)     Assistance Recommended at Discharge Frequent or constant Supervision/Assistance  Patient can return home with the following Two people to help with walking and/or transfers;Assistance with cooking/housework;Two people to help with bathing/dressing/bathroom;Direct supervision/assist for medications management;Direct supervision/assist for financial management;Assist for transportation;Help with stairs or ramp for entrance   Equipment Recommendations  Hospital bed    Recommendations for Other Services       Precautions / Restrictions Precautions Precautions: Fall Precaution Comments: new L AKA with chronic L BKA. Restrictions RLE Weight Bearing: Non weight bearing LLE Weight Bearing: Non weight bearing     Mobility  Bed Mobility Overal bed mobility: Needs Assistance Bed Mobility:  Supine to Sit, Sit to Supine     Supine to sit: Min assist Sit to supine: Min assist, +2 for physical assistance   General bed mobility comments: up via R elbow., truncal assist and then stability.  mod assist for scooting forward to EOB/    Transfers                   General transfer comment: Work on boost on bil UE's to prep UE's for scoot technique    Ambulation/Gait                   Stairs             Wheelchair Mobility    Modified Rankin (Stroke Patients Only)       Balance Overall balance assessment: Needs assistance Sitting-balance support: Feet unsupported, Bilateral upper extremity supported Sitting balance-Leahy Scale: Fair Sitting balance - Comments: Worked in supported and unsupport sitting with/without UE's and upright posture.  Balance once down propped on R vs L elbow and push up to midline with min guard assist  over 5 minutes time.  Worked on boosting and scooting backward in the bed in prep for lying down.                                    Cognition Arousal/Alertness: Awake/alert Behavior During Therapy: Flat affect Overall Cognitive Status: History of cognitive impairments - at baseline                                 General Comments: per wife, pt  seems to be at cognitive baseline. was able to answer basic questions. asked patient what is yoru name Jonathan [name]. what is wifes name [name]        Exercises Amputee Exercises Hip Extension: AAROM, Both, 10 reps, Supine (graded resistance) Hip ABduction/ADduction: AAROM, Both, 10 reps, Supine Hip Flexion/Marching: AROM, AAROM, Both, 10 reps, Supine (graded resistance)    General Comments General comments (skin integrity, edema, etc.): vss on RA,  wife present and participative      Pertinent Vitals/Pain Pain Assessment Pain Assessment: Faces Faces Pain Scale: Hurts little more Pain Location: L residual limb    Home Living                           Prior Function            PT Goals (current goals can now be found in the care plan section) Acute Rehab PT Goals Patient Stated Goal: be able to pivot with R prosthetic to decrease burden for transfers (per wife) PT Goal Formulation: With patient/family Time For Goal Achievement: 09/03/21 Potential to Achieve Goals: Fair Progress towards PT goals: Progressing toward goals    Frequency    Min 3X/week      PT Plan Current plan remains appropriate    Co-evaluation              AM-PAC PT "6 Clicks" Mobility   Outcome Measure  Help needed turning from your back to your side while in a flat bed without using bedrails?: A Lot Help needed moving from lying on your back to sitting on the side of a flat bed without using bedrails?: A Lot Help needed moving to and from a bed to a chair (including a wheelchair)?: Total Help needed standing up from a chair using your arms (e.g., wheelchair or bedside chair)?: Total Help needed to walk in hospital room?: Total Help needed climbing 3-5 steps with a railing? : Total 6 Click Score: 8    End of Session   Activity Tolerance: Patient limited by pain;Patient tolerated treatment well;Other (comment) (limited focus) Patient left: in bed;with call bell/phone within reach;with family/visitor present Nurse Communication: Mobility status PT Visit Diagnosis: Other abnormalities of gait and mobility (R26.89);Muscle weakness (generalized) (M62.81)     Time: 5277-8242 PT Time Calculation (min) (ACUTE ONLY): 25 min  Charges:  $Therapeutic Exercise: 8-22 mins $Therapeutic Activity: 8-22 mins                     08/23/2021  Ginger Carne., PT Acute Rehabilitation Services [phone]  (pager) [phone]  (office)   Tessie Fass Hydeia Mcatee 08/23/2021, 6:52 PM

## 2021-08-23 NOTE — Progress Notes (Addendum)
Patient with approximately 60 beats of Vtach on monitor when rolling from side to side to get cleaned after using restroom, patient asymptomatic. Vitals signs obtained see flowsheet. Dr. Starla Link made aware orders written  Will continue to monitor. Darwin Guastella, Bettina Gavia RN

## 2021-08-23 NOTE — Progress Notes (Signed)
Patient arrived back from HD to 4e24 vital signs obtained. Jonathan [name], [employer]

## 2021-08-23 NOTE — Progress Notes (Addendum)
°  Progress Note    08/23/2021 7:40 AM 4 Days Post-Op  Subjective:  no complaints. Denies any pain. Very sleepy   Vitals:   08/22/21 2000 08/22/21 2330  BP: 126/77 127/71  Pulse: 93 93  Resp: 20 20  Temp: 98.4 F (36.9 C) 98.5 F (36.9 C)  SpO2: 100% 95%   Physical Exam: Cardiac:  regular Lungs:  non labored Incisions:  Left AKA with Prevena VAC to suction. 50 cc bloody output Neurologic: sleepy  CBC    Component Value Date/Time   WBC 8.0 08/20/2021 0444   RBC 3.28 (L) 08/20/2021 0444   HGB 9.5 (L) 08/20/2021 0444   HCT 30.0 (L) 08/20/2021 0444   PLT 126 (L) 08/20/2021 0444   MCV 91.5 08/20/2021 0444   MCH 29.0 08/20/2021 0444   MCHC 31.7 08/20/2021 0444   RDW 19.5 (H) 08/20/2021 0444   LYMPHSABS 1.2 06/25/2021 0931   MONOABS 0.8 06/25/2021 0931   EOSABS 0.2 06/25/2021 0931   BASOSABS 0.0 06/25/2021 0931    BMET    Component Value Date/Time   NA 130 (L) 08/23/2021 0151   K 5.6 (H) 08/23/2021 0151   CL 94 (L) 08/23/2021 0151   CO2 26 08/23/2021 0151   GLUCOSE 145 (H) 08/23/2021 0151   BUN 65 (H) 08/23/2021 0151   CREATININE 7.93 (H) 08/23/2021 0151   CALCIUM 8.5 (L) 08/23/2021 0151   GFRNONAA 7 (L) 08/23/2021 0151   GFRAA 13 (L) 09/22/2019 0733    INR    Component Value Date/Time   INR 1.3 (H) 06/04/2021 0459     Intake/Output Summary (Last 24 hours) at 08/23/2021 0740 Last data filed at 08/22/2021 2000 Gross per 24 hour  Intake 240 ml  Output 0 ml  Net 240 ml     Assessment/Plan:  71 y.o. male is s/p L AKA 4 Days Post-Op   Prevena VAC to amputation site is in place. This will stay on until 1/19 or just prior to d/c Continue PT/ OT  Scheduled for HD today Pending Dispo to CIR  DVT prophylaxis:  Apixaban   Karoline Caldwell, PA-C [facility] [phone] 08/23/2021  VASCULAR STAFF ADDENDUM: I have independently interviewed and examined the patient. I agree with the above.   [name], MD [facility] [place] Phone Number: [phone] 08/23/2021 10:30 AM

## 2021-08-23 NOTE — Progress Notes (Signed)
Inpatient Rehab Admissions Coordinator:  Saw pt's wife at bedside. Pt was in HD. Informed that awaiting bed availability and insurance approval. Will continue to follow.   Gayland Curry, [place], [place] Admissions Coordinator [phone]

## 2021-08-23 NOTE — Progress Notes (Signed)
PT Cancellation Note  Patient Details Name: Jonathan MAHLER Sr. MRN: [id number] DOB: 01-11-1951   Cancelled Treatment:    Reason Eval/Treat Not Completed: Patient at procedure or test/unavailable.  Pt transported to HD before arrival.  Will check back later as able. 08/23/2021  Ginger Carne., PT Acute Rehabilitation Services [phone]  (pager) [phone]  (office)   Tessie Fass [name] 08/23/2021, 1:20 PM

## 2021-08-23 NOTE — Progress Notes (Signed)
Nephrology Follow-Up Consult note   Assessment/Recommendations: Jonathan STANDISH Sr. is a/an 71 y.o. male with a past medical history significant for ESRD, admitted for AKA on 1/12.        OP HD: MWF [facility]  4h 26min  55kg  2/2 bath  Hep 3000  LU AVF   - hect 4 ug tiw   - mircera 150 q2, last 1/02 (due 1/16)  ESRD: Continue MWF schedule  Status post AKA 1/12: Per [facility]  HTN/Volume: below EDW likely w/ weight loss. BP good. Adjust Uf PRN  Anemia of CKD; continue ESA 163mcg weekly.  H/o failed renal transplant: continue prednisone daily  Secondary Hyperparathyroidism: continue hecterol and binder. Check phos  DM2: mgmt per primary  H/o CVA: aphasia and dementia     Recommendations conveyed to primary service.    [facility] 08/23/2021 9:56 AM  ___________________________________________________________  CC: ESRD  Interval History/Subjective: Patient has no complaints today.   Medications:  Current Facility-Administered Medications  Medication Dose Route Frequency Provider Last Rate Last Admin   allopurinol (ZYLOPRIM) tablet 100 mg  100 mg Oral QHS Wynetta Fines T, MD   100 mg at 08/22/21 2200   apixaban (ELIQUIS) tablet 5 mg  5 mg Oral BID Darliss Cheney, MD   5 mg at 08/22/21 2200   aspirin chewable tablet 81 mg  81 mg Oral Daily Wynetta Fines T, MD   81 mg at 08/22/21 0944   atorvastatin (LIPITOR) tablet 40 mg  40 mg Oral QHS Wynetta Fines T, MD   40 mg at 08/22/21 2200   Darbepoetin Alfa (ARANESP) injection 100 mcg  100 mcg Intravenous Q Mon-HD Kris Mouton, RPH       doxercalciferol (HECTOROL) injection 4 mcg  4 mcg Intravenous Q M,W,F-HD Roney Jaffe, MD       folic acid (FOLVITE) tablet 1 mg  1 mg Oral Daily Wynetta Fines T, MD   1 mg at 08/22/21 0944   HYDROcodone-acetaminophen (NORCO/VICODIN) 5-325 MG per tablet 1 tablet  1 tablet Oral BID PRN Wynetta Fines T, MD   1 tablet at 08/19/21 2210   insulin aspart (novoLOG) injection 0-6 Units   0-6 Units Subcutaneous TID WC Wynetta Fines T, MD   2 Units at 08/22/21 1655   lactose free nutrition (BOOST PLUS) liquid 237 mL  237 mL Oral Daily PRN Reome, Earle J, RPH       levalbuterol (XOPENEX) nebulizer solution 0.63 mg  0.63 mg Inhalation Q8H PRN Pahwani, Einar Grad, MD       lidocaine-prilocaine (EMLA) cream 1 application  1 application Topical Once per day on Mon Wed Fri Zhang, Ping T, MD       melatonin tablet 5 mg  5 mg Oral QHS PRN Wynetta Fines T, MD   5 mg at 08/19/21 2210   ondansetron (ZOFRAN) tablet 4 mg  4 mg Oral Q6H PRN Wynetta Fines T, MD       Or   ondansetron [facility]) injection 4 mg  4 mg Intravenous Q6H PRN Wynetta Fines T, MD       polyethylene glycol (MIRALAX / GLYCOLAX) packet 17 g  17 g Oral Daily PRN Wynetta Fines T, MD   17 g at 08/21/21 2147   predniSONE (DELTASONE) tablet 10 mg  10 mg Oral Q breakfast Wynetta Fines T, MD   10 mg at 08/23/21 0824   sevelamer carbonate (RENVELA) tablet 800 mg  800 mg Oral TID WC Wynetta Fines T,  MD   800 mg at 08/23/21 0824      ROS: Unable to obtain due to the patient's nonverbal status  Physical Exam: Vitals:   08/22/21 2330 08/23/21 0746  BP: 127/71 120/76  Pulse: 93 93  Resp: 20 16  Temp: 98.5 F (36.9 C) 98.2 F (36.8 C)  SpO2: 95% 100%   Total I/O In: 240 [P.O.:240] Out: -   Intake/Output Summary (Last 24 hours) at 08/23/2021 3762 Last data filed at 08/23/2021 0800 Gross per 24 hour  Intake 240 ml  Output 0 ml  Net 240 ml   Constitutional: well-appearing, no acute distress ENMT: ears and nose without scars or lesions, MMM CV: normal rate, no edema Respiratory: Bilateral chest rise, normal work of breathing Gastrointestinal: soft, non-tender, no palpable masses or hernias Skin: no visible lesions or rashes Psych: awake and alert   Test Results I personally reviewed new and old clinical labs and radiology tests Lab Results  Component Value Date   NA 130 (L) 08/23/2021   K 5.6 (H) 08/23/2021   CL 94 (L) 08/23/2021    CO2 26 08/23/2021   BUN 65 (H) 08/23/2021   CREATININE 7.93 (H) 08/23/2021   CALCIUM 8.5 (L) 08/23/2021   ALBUMIN 2.4 (L) 08/23/2021   PHOS 3.7 06/16/2021

## 2021-08-23 NOTE — Progress Notes (Signed)
Pt receives out-pt HD at [facility] on MWF. Pt arrives at 11:30 for 11:50 chair time. Will assist as needed.  Melven Sartorius Renal Navigator [phone]

## 2021-08-23 NOTE — Progress Notes (Signed)
Patient ID: Jonathan Moffa., male   DOB: Dec 26, 1950, 71 y.o.   MRN: [id number]  PROGRESS NOTE    Jonathan [name]  OQH:[id number] DOB: 1950-11-19 DOA: 08/19/2021 PCP: Ginger Organ., MD   Brief Narrative:  71 y.o. 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  Nephrology and vascular surgery were consulted.  He underwent left AKA on 08/19/2021.  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 -PT/OT recommend CIR placement.  CIR consulted. -Continue pain management.  Fall precautions.  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  Hemodialysis as per nephrology schedule -Currently still on oral prednisone.  LV thrombosis  -Eliquis has been already resumed  Hyperkalemia -Mild.  Will probably be managed by hemodialysis  Diabetes mellitus type 2 with hyperglycemia -A1c 4.9.  Continue CBGs with SSI  Hyperlipidemia -Continue statin  Anemia of chronic disease -Possibly from renal failure.  Monitor  Hyponatremia -Being managed by dialysis  Thrombocytopenia -Questionable cause.  No signs of bleeding.  Monitor intermittently  History of unspecified stroke with residual aphasia -Continue aspirin, statin and Eliquis  Generalized deconditioning -Overall prognosis is guarded to poor.  Recommend outpatient evaluation and follow-up by palliative care for goals of care discussion  DVT prophylaxis: Eliquis Code Status: Full Family Communication: Wife at bedside on 08/21/2021 Disposition Plan: Status is:  Inpatient  Remains inpatient appropriate because: Of need for CIR placement.  Currently medically stable for discharge to CIR  Consultants: Vascular surgery/nephrology  Procedures: Left AKA on 08/19/2020  Antimicrobials:  Anti-infectives (From admission, onward)    Start     Dose/Rate Route Frequency Ordered Stop   08/19/21 0856  ceFAZolin (ANCEF) IVPB 2g/100 mL premix        2 g 200 mL/hr over 30 Minutes Intravenous 30 min pre-op 08/19/21 0856 08/19/21 1137        Subjective: Patient seen and examined at bedside.  Poor historian.  No fever, vomiting, agitation reported.   Objective: Vitals:   08/22/21 1634 08/22/21 2000 08/22/21 2330 08/23/21 0746  BP: 122/69 126/77 127/71 120/76  Pulse: 97 93 93 93  Resp: 20 20 20 16   Temp: 98.3 F (36.8 C) 98.4 F (36.9 C) 98.5 F (36.9 C) 98.2 F (36.8 C)  TempSrc: Oral Oral Oral Oral  SpO2: 100% 100% 95% 100%  Weight:      Height:        Intake/Output Summary (Last 24 hours) at 08/23/2021 0754 Last data filed at 08/22/2021 2000 Gross per 24 hour  Intake 240 ml  Output 0 ml  Net 240 ml    Filed Weights   08/19/21 1549 08/20/21 1509 08/20/21 1803  Weight: 62.6 kg 50 kg 47.4 kg    Examination:  General exam: Currently on room air.  No acute distress.  Poor historian.  Does not participate in conversation much.  Speech is still slightly garbled.  Affect is still very flat. Respiratory system: Bilateral decreased breath sounds at bases with scattered crackles  cardiovascular system: S1-S2 heard; currently rate controlled gastrointestinal system: Abdomen is distended slightly; soft and nontender.  Bowel  sounds are heard  extremities: Left AKA with dressing and right BKA present  Data Reviewed: I have personally reviewed following labs and imaging studies  CBC: Recent Labs  Lab 08/19/21 0925 08/20/21 0444  WBC  --  8.0  HGB 16.3 9.5*  HCT 48.0 30.0*  MCV  --  91.5  PLT  --  126*    Basic Metabolic Panel: Recent  Labs  Lab 08/19/21 0925 08/20/21 0444 08/23/21 0151  NA 136 134* 130*  K 5.4* 4.4 5.6*  CL 97* 94* 94*  CO2  --  29 26  GLUCOSE 105* 123* 145*  BUN 40* 35* 65*  CREATININE 4.90* 5.94* 7.93*  CALCIUM  --  8.6* 8.5*    GFR: Estimated Creatinine Clearance: 6 mL/min (A) (by C-G formula based on SCr of 7.93 mg/dL (H)). Liver Function Tests: Recent Labs  Lab 08/23/21 0151  AST 13*  ALT <5  ALKPHOS 46  BILITOT 0.3  PROT 5.6*  ALBUMIN 2.4*   No results for input(s): LIPASE, AMYLASE in the last 168 hours. No results for input(s): AMMONIA in the last 168 hours. Coagulation Profile: No results for input(s): INR, PROTIME in the last 168 hours. Cardiac Enzymes: No results for input(s): CKTOTAL, CKMB, CKMBINDEX, TROPONINI in the last 168 hours. BNP (last 3 results) No results for input(s): PROBNP in the last 8760 hours. HbA1C: No results for input(s): HGBA1C in the last 72 hours.  CBG: Recent Labs  Lab 08/22/21 0634 08/22/21 1202 08/22/21 1635 08/22/21 1949 08/23/21 0628  GLUCAP 108* 150* 231* 235* 113*    Lipid Profile: No results for input(s): CHOL, HDL, LDLCALC, TRIG, CHOLHDL, LDLDIRECT in the last 72 hours. Thyroid Function Tests: No results for input(s): TSH, T4TOTAL, FREET4, T3FREE, THYROIDAB in the last 72 hours. Anemia Panel: No results for input(s): VITAMINB12, FOLATE, FERRITIN, TIBC, IRON, RETICCTPCT in the last 72 hours. Sepsis Labs: No results for input(s): PROCALCITON, LATICACIDVEN in the last 168 hours.  Recent Results (from the past 240 hour(s))  SARS Coronavirus 2 (TAT 6-24 hrs)     Status: None   Collection Time: 08/17/21 12:00 AM  Result Value Ref Range Status   SARS Coronavirus 2 RESULT: NEGATIVE  Final    Comment: RESULT: NEGATIVESARS-CoV-2 INTERPRETATION:A NEGATIVE  test result means that SARS-CoV-2 RNA was not present in the specimen above the limit of detection of this test. This does not preclude a possible SARS-CoV-2 infection and should not  be used as the  sole basis for patient management decisions. Negative results must be combined with clinical observations, patient history, and epidemiological information. Optimum specimen types and timing for peak viral levels during infections caused by SARS-CoV-2  have not been determined. Collection of multiple specimens or types of specimens may be necessary to detect virus. Improper specimen collection and handling, sequence variability under primers/probes, or organism present below the limit of detection may  lead to false negative results. Positive and negative predictive values of testing are highly dependent on prevalence. False negative test results are more likely when prevalence of disease is high.The expected result is NEGATIVE.Fact S heet for  Healthcare Providers: LocalChronicle.no Sheet for Patients: SalonLookup.es Reference Range - Negative           Radiology Studies: No results found.      Scheduled Meds:  allopurinol  100 mg Oral QHS   apixaban  5 mg Oral BID   aspirin  81 mg Oral Daily   atorvastatin  40 mg Oral QHS   darbepoetin (  ARANESP) injection - NON-DIALYSIS  100 mcg Subcutaneous Q Mon-1800   doxercalciferol  4 mcg Intravenous Q M,W,F-HD   folic acid  1 mg Oral Daily   insulin aspart  0-6 Units Subcutaneous TID WC   lidocaine-prilocaine  1 application Topical Once per day on Mon Wed Fri   predniSONE  10 mg Oral Q breakfast   sevelamer carbonate  800 mg Oral TID WC   sodium zirconium cyclosilicate  10 g Oral Once   Continuous Infusions:        Aline August, MD [facility] 08/23/2021, 7:54 AM

## 2021-08-24 LAB — GLUCOSE, CAPILLARY
Glucose-Capillary: 177 mg/dL — ABNORMAL HIGH (ref 70–99)
Glucose-Capillary: 108 mg/dL — ABNORMAL HIGH (ref 70–99)
Glucose-Capillary: 152 mg/dL — ABNORMAL HIGH (ref 70–99)
Glucose-Capillary: 192 mg/dL — ABNORMAL HIGH (ref 70–99)
Glucose-Capillary: 194 mg/dL — ABNORMAL HIGH (ref 70–99)

## 2021-08-24 LAB — HEPATITIS B SURFACE ANTIBODY, QUANTITATIVE: Hep B S AB Quant (Post): 5.9 m[IU]/mL — ABNORMAL LOW (ref >9.9)

## 2021-08-24 NOTE — Progress Notes (Signed)
Occupational Therapy Treatment Patient Details Name: Jonathan SHERWIN Sr. MRN: [id number] DOB: February 10, 1951 Today's Date: 08/24/2021   History of present illness The pt is a 71 yo male presenting for elective L AKA for management of worsening L heel ulcer and after L hip fx requiring L TKA. PMH includes: ESRD on HD MWF s/p kidney transplant and subsequent failure, HTN, HLD, DM, PVD, and R BKA.   OT comments  Pt progressing towards acute OT goals. Focus of session was building endurance with unsupported sitting in preparation for functional transfers. Pt able to sit unsupported about 5 minutes, supported for 3 minutes. BP assessed EOB 136/71. VSS stable throughout session. Pt indicating LLE residual limb pain, nursing notified. Pt able to scoot self up in bed in supine with multimodal cues provided. Spouse present, included, and involved during session. D/c recommendation remains appropriate.    Recommendations for follow up therapy are one component of a multi-disciplinary discharge planning process, led by the attending physician.  Recommendations may be updated based on patient status, additional functional criteria and insurance authorization.    Follow Up Recommendations  Acute inpatient rehab (3hours/day)    Assistance Recommended at Discharge Intermittent Supervision/Assistance  Patient can return home with the following      Equipment Recommendations  Wheelchair (measurements OT);Wheelchair cushion (measurements OT);Hospital bed;Other (comment) (lift)    Recommendations for Other Services      Precautions / Restrictions Precautions Precautions: Fall Precaution Comments: new L AKA with chronic L BKA. Restrictions Weight Bearing Restrictions: Yes RLE Weight Bearing: Non weight bearing LLE Weight Bearing: Non weight bearing       Mobility Bed Mobility Overal bed mobility: Needs Assistance Bed Mobility: Rolling, Supine to Sit, Sit to Supine Rolling: Min assist   Supine to sit:  Mod assist, HOB elevated Sit to supine: Mod assist   General bed mobility comments: truncal assist and assist to pivot hips to full position. pt utilized bed rail, OT utilized bed pad.    Transfers                   General transfer comment: worked on unupported sitting tolerance and positioning in preparation for attempting lateral scoot. Will need +2 assist and drop arm/flat surface.     Balance Overall balance assessment: Needs assistance Sitting-balance support: Feet unsupported, Bilateral upper extremity supported Sitting balance-Leahy Scale: Fair Sitting balance - Comments: Able to sit EOB in supported sittign about 5 minutes then supported sitting another 3 minutes. BUE support in static sitting. Worked on coming out of trunk flexed position.                                   ADL either performed or assessed with clinical judgement   ADL Overall ADL's : Needs assistance/impaired                                       General ADL Comments: worked on bed mobility and sitting balance initially in unsupported sitting then provided support to try to work on coming out of trunk flexion. BUE and trunk flexed position. Mod A for supine<>EOB. Pt able to scoot up in bed in supine with multimodal cues for hand placement on head board rail.    Extremity/Trunk Assessment Upper Extremity Assessment Upper Extremity Assessment: Generalized weakness   Lower Extremity Assessment  Lower Extremity Assessment: Defer to PT evaluation        Vision       Perception     Praxis      Cognition Arousal/Alertness: Awake/alert Behavior During Therapy: Flat affect Overall Cognitive Status: History of cognitive impairments - at baseline                                 General Comments: per wife, pt seems to be at cognitive baseline. was able to answer basic questions.        Exercises      Shoulder Instructions       General  Comments vss on RA,  wife present and very involved    Pertinent Vitals/ Pain       Pain Assessment Pain Assessment: Faces Faces Pain Scale: Hurts even more Pain Location: L residual limb Pain Descriptors / Indicators: Grimacing Pain Intervention(s): Monitored during session, Limited activity within patient's tolerance, Repositioned, Patient requesting pain meds-RN notified  Home Living                                          Prior Functioning/Environment              Frequency  Min 3X/week        Progress Toward Goals  OT Goals(current goals can now be found in the care plan section)  Progress towards OT goals: Progressing toward goals  Acute Rehab OT Goals Patient Stated Goal: to get him to doing things better for home OT Goal Formulation: With patient/family Time For Goal Achievement: 09/03/21 Potential to Achieve Goals: Good ADL Goals Pt Will Perform Grooming: with set-up;sitting Pt Will Perform Upper Body Bathing: with set-up;sitting Additional ADL Goal #1: Pt will complete basic transfer sliding board Total+2 mod (A) Additional ADL Goal #2: Pt will complete posterior transfer into chair total +2 mod (A)  Plan Discharge plan remains appropriate    Co-evaluation                 AM-PAC OT "6 Clicks" Daily Activity     Outcome Measure   Help from another person eating meals?: None Help from another person taking care of personal grooming?: None Help from another person toileting, which includes using toliet, bedpan, or urinal?: A Lot Help from another person bathing (including washing, rinsing, drying)?: A Lot Help from another person to put on and taking off regular upper body clothing?: A Little Help from another person to put on and taking off regular lower body clothing?: A Lot 6 Click Score: 17    End of Session    OT Visit Diagnosis: Muscle weakness (generalized) (M62.81)   Activity Tolerance Patient limited by  pain;Patient tolerated treatment well   Patient Left in bed;with call bell/phone within reach;with family/visitor present (did not alter bed alarm setting)   Nurse Communication Patient requests pain meds        Time: 1308-6578 OT Time Calculation (min): 25 min  Charges: OT General Charges $OT Visit: 1 Visit OT Treatments $Self Care/Home Management : 23-37 mins  Tyrone Schimke, OT Acute Rehabilitation Services Office: [phone]   Hortencia Pilar 08/24/2021, 9:58 AM

## 2021-08-24 NOTE — Care Management Important Message (Signed)
Important Message  Patient Details  Name: Jonathan SCHOLLE Sr. MRN: [id number] Date of Birth: 1950/10/19   Medicare Important Message Given:  Yes     [name], [name] 08/24/2021, 7:51 AM

## 2021-08-24 NOTE — Progress Notes (Signed)
Inpatient Rehabilitation Admissions Coordinator   I have begun Auth with [facility] for a possible Cir admit . I met with patient and his wife at bedside and wife is in agreement.  Danne Baxter, RN, MSN Rehab Admissions Coordinator [phone] 08/24/2021 10:45 AM

## 2021-08-24 NOTE — Progress Notes (Addendum)
°  Progress Note    08/24/2021 7:15 AM 5 Days Post-Op  Subjective:  no complaints  Afebrile  Vitals:   08/23/21 2033 08/24/21 0423  BP: 115/69 116/73  Pulse: 83 87  Resp: 17 16  Temp: 98.9 F (37.2 C) 97.7 F (36.5 C)  SpO2: 99% 99%    Physical Exam: Incisions:  dressing inplace   CBC    Component Value Date/Time   WBC 8.0 08/20/2021 0444   RBC 3.28 (L) 08/20/2021 0444   HGB 9.5 (L) 08/20/2021 0444   HCT 30.0 (L) 08/20/2021 0444   PLT 126 (L) 08/20/2021 0444   MCV 91.5 08/20/2021 0444   MCH 29.0 08/20/2021 0444   MCHC 31.7 08/20/2021 0444   RDW 19.5 (H) 08/20/2021 0444   LYMPHSABS 1.2 06/25/2021 0931   MONOABS 0.8 06/25/2021 0931   EOSABS 0.2 06/25/2021 0931   BASOSABS 0.0 06/25/2021 0931    BMET    Component Value Date/Time   NA 134 (L) 08/23/2021 1920   K 4.7 08/23/2021 1920   CL 94 (L) 08/23/2021 1920   CO2 27 08/23/2021 1920   GLUCOSE 243 (H) 08/23/2021 1920   BUN 43 (H) 08/23/2021 1920   CREATININE 5.58 (H) 08/23/2021 1920   CALCIUM 8.1 (L) 08/23/2021 1920   GFRNONAA 10 (L) 08/23/2021 1920   GFRAA 13 (L) 09/22/2019 0733    INR    Component Value Date/Time   INR 1.3 (H) 06/04/2021 0459     Intake/Output Summary (Last 24 hours) at 08/24/2021 0715 Last data filed at 08/23/2021 1533 Gross per 24 hour  Intake 480 ml  Output 0 ml  Net 480 ml     Assessment/Plan:  71 y.o. male is s/p left above knee amputation  5 Days Post-Op  -continue dressing until 1/19 or off prior to discharge -continue OT/PT -pt will f/u in 4 weeks for staple removal.  Our office will arrange appt.    Leontine Locket, PA-C [facility] [phone] 08/24/2021 7:15 AM   VASCULAR STAFF ADDENDUM: I have independently interviewed and examined the patient. I agree with the above.  Dressing down 08/26/21. Please call if patient is going to be discharged prior to this and we will remove the bandage.  [name], MD [facility] Phone Number: [phone] 08/24/2021 11:28 AM

## 2021-08-24 NOTE — Progress Notes (Signed)
Patient ID: Jonathan [name]., male   DOB: Mar 26, 1951, 71 y.o.   MRN: [id number]  PROGRESS NOTE    Finch Costanzo  MEQ:[id number] DOB: 12-Apr-1951 DOA: 08/19/2021 PCP: Ginger Organ., MD   Brief Narrative:  71 y.o. 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  Nephrology and vascular surgery were consulted.  He underwent left AKA on 08/19/2021.  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 -PT/OT recommend CIR placement.  CIR consulted. -Continue pain management.  Fall precautions.  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  Hemodialysis as per nephrology schedule -Currently still on oral prednisone.  LV thrombosis  -Eliquis has been already resumed  Hyperkalemia -Resolved  Diabetes mellitus type 2 with hyperglycemia -A1c 4.9.  Continue CBGs with SSI  Hyperlipidemia -Continue statin  Anemia of chronic disease -Possibly from renal failure.  Monitor  Hyponatremia -Being managed by dialysis  Thrombocytopenia -Questionable cause.  No signs of bleeding.  Monitor intermittently  History of unspecified stroke with residual aphasia -Continue aspirin, statin and Eliquis  Generalized deconditioning -Overall prognosis is guarded to poor.  Recommend outpatient evaluation and follow-up by palliative care for goals of care discussion  DVT prophylaxis: Eliquis Code Status: Full Family Communication: Wife at bedside on 08/23/2021 Disposition Plan: Status is: Inpatient  Remains inpatient appropriate because: Of  need for CIR placement.  Currently medically stable for discharge to CIR  Consultants: Vascular surgery/nephrology  Procedures: Left AKA on 08/19/2020  Antimicrobials:  Anti-infectives (From admission, onward)    Start     Dose/Rate Route Frequency Ordered Stop   08/19/21 0856  ceFAZolin (ANCEF) IVPB 2g/100 mL premix        2 g 200 mL/hr over 30 Minutes Intravenous 30 min pre-op 08/19/21 0856 08/19/21 1137        Subjective: Patient seen and examined at bedside.  Poor historian.  No worsening shortness of fever, vomiting reported.   Objective: Vitals:   08/23/21 1732 08/23/21 2033 08/24/21 0423 08/24/21 0737  BP: 123/68 115/69 116/73 116/66  Pulse:  83 87 83  Resp: 19 17 16  (!) 21  Temp:  98.9 F (37.2 C) 97.7 F (36.5 C) 98.1 F (36.7 C)  TempSrc:  Axillary Oral Oral  SpO2: 100% 99% 99% 97%  Weight:   49.1 kg   Height:        Intake/Output Summary (Last 24 hours) at 08/24/2021 0739 Last data filed at 08/23/2021 1533 Gross per 24 hour  Intake 480 ml  Output 0 ml  Net 480 ml    Filed Weights   08/20/21 1803 08/23/21 1230 08/24/21 0423  Weight: 47.4 kg 50.3 kg 49.1 kg    Examination:  General exam: No distress.  On room air currently.  Poor historian.  Does not participate in conversation much.  Speech is still slightly garbled.  Affect is still very flat. Respiratory system: Decreased breath sounds at bases bilaterally with some crackles  cardiovascular system: Rate controlled currently; S1-S2 heard gastrointestinal system: Abdomen is mildly distended; soft and nontender.  Normal bowel sounds heard  extremities: Left AKA  with dressing and right BKA present  Data Reviewed: I have personally reviewed following labs and imaging studies  CBC: Recent Labs  Lab 08/19/21 0925 08/20/21 0444  WBC  --  8.0  HGB 16.3 9.5*  HCT 48.0 30.0*  MCV  --  91.5  PLT  --  126*    Basic Metabolic Panel: Recent Labs  Lab 08/19/21 0925 08/20/21 0444 08/23/21 0151  08/23/21 1002 08/23/21 1920  NA 136 134* 130*  --  134*  K 5.4* 4.4 5.6*  --  4.7  CL 97* 94* 94*  --  94*  CO2  --  29 26  --  27  GLUCOSE 105* 123* 145*  --  243*  BUN 40* 35* 65*  --  43*  CREATININE 4.90* 5.94* 7.93*  --  5.58*  CALCIUM  --  8.6* 8.5*  --  8.1*  MG  --   --   --   --  2.1  PHOS  --   --   --  5.4*  --     GFR: Estimated Creatinine Clearance: 8.8 mL/min (A) (by C-G formula based on SCr of 5.58 mg/dL (H)). Liver Function Tests: Recent Labs  Lab 08/23/21 0151  AST 13*  ALT <5  ALKPHOS 46  BILITOT 0.3  PROT 5.6*  ALBUMIN 2.4*    No results for input(s): LIPASE, AMYLASE in the last 168 hours. No results for input(s): AMMONIA in the last 168 hours. Coagulation Profile: No results for input(s): INR, PROTIME in the last 168 hours. Cardiac Enzymes: No results for input(s): CKTOTAL, CKMB, CKMBINDEX, TROPONINI in the last 168 hours. BNP (last 3 results) No results for input(s): PROBNP in the last 8760 hours. HbA1C: No results for input(s): HGBA1C in the last 72 hours.  CBG: Recent Labs  Lab 08/22/21 1949 08/23/21 0628 08/23/21 1147 08/23/21 2119 08/24/21 0621  GLUCAP 235* 113* 173* 214* 108*    Lipid Profile: No results for input(s): CHOL, HDL, LDLCALC, TRIG, CHOLHDL, LDLDIRECT in the last 72 hours. Thyroid Function Tests: No results for input(s): TSH, T4TOTAL, FREET4, T3FREE, THYROIDAB in the last 72 hours. Anemia Panel: No results for input(s): VITAMINB12, FOLATE, FERRITIN, TIBC, IRON, RETICCTPCT in the last 72 hours. Sepsis Labs: No results for input(s): PROCALCITON, LATICACIDVEN in the last 168 hours.  Recent Results (from the past 240 hour(s))  SARS Coronavirus 2 (TAT 6-24 hrs)     Status: None   Collection Time: 08/17/21 12:00 AM  Result Value Ref Range Status   SARS Coronavirus 2 RESULT: NEGATIVE  Final    Comment: RESULT: NEGATIVESARS-CoV-2 INTERPRETATION:A NEGATIVE  test result means that SARS-CoV-2 RNA was not present in the  specimen above the limit of detection of this test. This does not preclude a possible SARS-CoV-2 infection and should not be used as the  sole basis for patient management decisions. Negative results must be combined with clinical observations, patient history, and epidemiological information. Optimum specimen types and timing for peak viral levels during infections caused by SARS-CoV-2  have not been determined. Collection of multiple specimens or types of specimens may be necessary to detect virus. Improper specimen collection and handling, sequence variability under primers/probes, or organism present below the limit of detection may  lead to false negative results. Positive and negative predictive values of testing are highly dependent on prevalence. False negative test results are more likely when prevalence of disease is high.The expected result is NEGATIVE.Fact S heet for  Healthcare Providers: LocalChronicle.no Sheet for Patients: SalonLookup.es  Reference Range - Negative           Radiology Studies: No results found.      Scheduled Meds:  allopurinol  100 mg Oral QHS   apixaban  5 mg Oral BID   aspirin  81 mg Oral Daily   atorvastatin  40 mg Oral QHS   darbepoetin (ARANESP) injection - DIALYSIS  100 mcg Intravenous Q Mon-HD   doxercalciferol  4 mcg Intravenous Q M,W,F-HD   folic acid  1 mg Oral Daily   insulin aspart  0-6 Units Subcutaneous TID WC   lidocaine-prilocaine  1 application Topical Once per day on Mon Wed Fri   predniSONE  10 mg Oral Q breakfast   sevelamer carbonate  800 mg Oral TID WC   Continuous Infusions:        Aline August, MD [facility] 08/24/2021, 7:39 AM

## 2021-08-24 NOTE — H&P (Incomplete)
Physical Medicine and Rehabilitation Admission H&P     HPI: Jonathan [name], Jonathan [name]. 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  Per chart review patient lives with spouse son and daughter-in-law and grandson.  Two-level home with bed and bath on main level.  He received [facility] transportation for hemodialysis.  Prior to patient's hip fracture he had been ambulating 120 feet with prosthesis performing sit to stand with supervision.  Presented 08/19/2021 with chronic left heel ulcer and no change with conservative care.  Patient underwent left AKA 08/19/2021 per Dr. Jamelle Haring.  Hospital course hemodialysis ongoing as per renal services.  Patient's chronic Eliquis has been resumed postoperatively..  Acute on chronic anemia 9.5 and monitored.  Therapy evaluations completed due to patient decreased functional mobility was admitted for a comprehensive rehab program.  Review of Systems  Constitutional:  Negative for chills and fever.  HENT:  Negative for hearing loss.   Eyes:  Negative for blurred vision and double vision.  Respiratory:  Positive for shortness of breath. Negative for cough and wheezing.   Cardiovascular:  Positive for leg swelling. Negative for chest pain and palpitations.  Gastrointestinal:  Positive for constipation. Negative for heartburn, nausea and vomiting.  Genitourinary:  Negative for flank pain and hematuria.  Musculoskeletal:  Positive for joint pain and myalgias.  Skin:  Negative for rash.  All other systems reviewed and are  negative. Past Medical History:  Diagnosis Date   CAD (coronary artery disease)    CHF (congestive heart failure) ([name])    Diabetes mellitus without complication ([place])    type 2   DM (diabetes mellitus) ([place])    ESRD (end stage renal disease) ([place])    dialysis Mon Wed Fri   FUO (fever of unknown origin) 09/17/2019   History of blood transfusion    Hyperlipidemia    Hypertension    Osteomyelitis ([name])    Peripheral arterial disease ([name])    PVD (peripheral vascular disease) ([place])    Renal disorder 2015   right kidney transplant   Stroke [facility])    Vascular dementia [facility])    Past Surgical History:  Procedure Laterality Date   ABDOMINAL AORTOGRAM N/A 08/05/2020   Procedure: ABDOMINAL AORTOGRAM;  Surgeon: [name], MD;  Location: [place] [facility];  Service: Cardiovascular;  Laterality: N/A;   ABDOMINAL AORTOGRAM W/LOWER EXTREMITY N/A 09/09/2020   Procedure: ABDOMINAL AORTOGRAM W/LOWER EXTREMITY;  Surgeon: [name], MD;  Location: [place] [facility];  Service: Cardiovascular;  Laterality: N/A;   AMPUTATION Right 08/06/2020   Procedure: Right Fifth Toe Ray Amputation;  Surgeon: [name], MD;  Location: [place];  Service: Vascular;  Laterality: Right;   AMPUTATION Right 11/02/2020   Procedure: RIGHT AMPUTATION BELOW KNEE;  Surgeon: [name], MD;  Location: [place];  Service: Vascular;  Laterality: Right;   AMPUTATION Left 08/19/2021   Procedure: LEFT ABOVE KNEE AMPUTATION;  Surgeon: [name], MD;  Location: [facility];  Service: Vascular;  Laterality: Left;   APPLICATION OF WOUND VAC Left 08/19/2021   Procedure: APPLICATION OF WOUND VAC;  Surgeon: [name], MD;  Location: [facility]  OR;  Service: Vascular;  Laterality: Left;   BACK SURGERY     Has had 2 back surgeries   BUBBLE STUDY  09/18/2019   Procedure: BUBBLE STUDY;  Surgeon: Buford Dresser, MD;  Location: [place];  Service: Cardiovascular;;   Depression     ENDARTERECTOMY FEMORAL Right  08/06/2020   Procedure: Right Ilio- Femoral Artery Endarterectomy;  Surgeon: [name], MD;  Location: [facility] OR;  Service: Vascular;  Laterality: Right;   HAND SURGERY Right    HAND SURGERY     HIP ARTHROPLASTY Left 06/04/2021   Procedure: ARTHROPLASTY BIPOLAR HIP (HEMIARTHROPLASTY);  Surgeon: Willaim Sheng, MD;  Location: [place];  Service: Orthopedics;  Laterality: Left;   IR DIALY SHUNT INTRO NEEDLE/INTRACATH INITIAL W/IMG RIGHT Right 06/07/2021   IR REMOVAL TUN CV CATH W/O FL  09/03/2019   KIDNEY TRANSPLANT     November 2015   LEG AMPUTATION BELOW KNEE Right    LOWER EXTREMITY ANGIOGRAPHY Bilateral 08/05/2020   Procedure: LOWER EXTREMITY ANGIOGRAPHY;  Surgeon: [name], MD;  Location: [place] [facility];  Service: Cardiovascular;  Laterality: Bilateral;   Memory loss     NEPHRECTOMY TRANSPLANTED ORGAN     PATCH ANGIOPLASTY Right 08/06/2020   Procedure: PATCH ANGIOPLASTY;  Surgeon: [name], MD;  Location: [facility] OR;  Service: Vascular;  Laterality: Right;   PERIPHERAL VASCULAR BALLOON ANGIOPLASTY Right 09/09/2020   Procedure: PERIPHERAL VASCULAR BALLOON ANGIOPLASTY;  Surgeon: [name], MD;  Location: [place] [facility];  Service: Cardiovascular;  Laterality: Right;  popliteal   PERIPHERAL VASCULAR INTERVENTION Right 08/05/2020   Procedure: PERIPHERAL VASCULAR INTERVENTION;  Surgeon: [name], MD;  Location: [place] [facility];  Service: Cardiovascular;  Laterality: Right;  SFA   PERIPHERAL VASCULAR INTERVENTION Right 09/09/2020   Procedure: PERIPHERAL VASCULAR INTERVENTION;  Surgeon: [name], MD;  Location: [place] [facility];  Service: Cardiovascular;  Laterality: Right;  external iliac   TEE WITHOUT CARDIOVERSION N/A 09/18/2019   Procedure: TRANSESOPHAGEAL ECHOCARDIOGRAM (TEE);  Surgeon: Buford Dresser, MD;  Location: [facility] ENDOSCOPY;  Service: Cardiovascular;  Laterality: N/A;   WOUND DEBRIDEMENT Right 10/08/2020   Procedure:  DEBRIDEMENT WOUND RIGHT FOOT;  Surgeon: [name], MD;  Location[facility] OR;  Service: Vascular;  Laterality: Right;   Family History  Problem Relation Age of Onset   Diabetes Mellitus II Mother    Cancer Mother        STOMACH   Diabetes Mellitus II Brother    Other Father        UNKOWN   Healthy Daughter    Diabetes Son    Social History:  reports that he has quit smoking. His smoking use included cigarettes. He smoked an average of 1 pack per day. He has never used smokeless tobacco. He reports that he does not currently use alcohol. He reports that he does not use drugs. Allergies: No Known Allergies Medications Prior to Admission  Medication Sig Dispense Refill   allopurinol (ZYLOPRIM) 100 MG tablet Take 100 mg by mouth at bedtime.     apixaban (ELIQUIS) 5 MG TABS tablet Take 1 tablet (5 mg total) by mouth 2 (two) times daily. 60 tablet 0   aspirin 81 MG chewable tablet Chew 81 mg by mouth daily.      atorvastatin (LIPITOR) 40 MG tablet Take 40 mg by mouth at bedtime.     b complex-vitamin c-folic acid (NEPHRO-VITE) 0.8 MG TABS tablet Take 1 tablet by mouth daily.  feeding supplement (BOOST HIGH PROTEIN) LIQD Take 1 Container by mouth daily as needed (nutrition).     folic acid (FOLVITE) 1 MG tablet Take 1 tablet (1 mg total) by mouth daily. 30 tablet 3   HYDROcodone-acetaminophen (NORCO/VICODIN) 5-325 MG tablet Take 1 tablet by mouth 2 (two) times daily as needed for moderate pain. 14 tablet 0   Inulin (FIBER CHOICE PO) Take 1 tablet by mouth daily.     lidocaine-prilocaine (EMLA) cream Apply 1 application topically 3 (three) times a week. 30 minutes prior to Dialysis - MWF  11   melatonin 5 MG TABS Take 1 tablet (5 mg total) by mouth at bedtime. (Patient taking differently: Take 5 mg by mouth at bedtime as needed (sleep).) 30 tablet 0   Nystatin (GERHARDT'S BUTT CREAM) CREA Apply 1 application topically 4 (four) times daily. Mix desitin with antifugal cream and apply a layer  to buttocks/scrotum.     polyethylene glycol (MIRALAX / GLYCOLAX) 17 g packet Take 17 g by mouth daily as needed for mild constipation. 14 each 0   predniSONE (DELTASONE) 5 MG tablet Take 5 mg by mouth daily with breakfast.     sevelamer carbonate (RENVELA) 800 MG tablet Take 1 tablet (800 mg total) by mouth 3 (three) times daily with meals. 90 tablet 0   witch hazel-glycerin (TUCKS) pad Apply topically as needed for itching. 40 each 12   acidophilus (RISAQUAD) CAPS capsule Take 2 capsules by mouth 3 (three) times daily. (Patient not taking: Reported on 08/12/2021) 90 capsule 0   diphenoxylate-atropine (LOMOTIL) 2.5-0.025 MG tablet Take 1 tablet by mouth at bedtime. (Patient not taking: Reported on 08/12/2021) 30 tablet 0   hydrocortisone (ANUSOL-HC) 25 MG suppository Place 1 suppository (25 mg total) rectally 2 (two) times daily. (Patient not taking: Reported on 08/12/2021) 14 suppository 0   insulin aspart (NOVOLOG) 100 UNIT/ML injection Inject 0-9 Units into the skin as needed for high blood sugar. 10 mL 11   Insulin Pen Needle 32G X 4 MM MISC 1 each by Other route as needed (insulin).      levalbuterol (XOPENEX HFA) 45 MCG/ACT inhaler Inhale 2 puffs into the lungs every 8 (eight) hours as needed for wheezing. 1 Inhaler 12   nystatin (MYCOSTATIN/NYSTOP) powder Apply topically 3 (three) times daily. (Patient not taking: Reported on 08/12/2021) 60 g 0   nystatin cream (MYCOSTATIN) Apply topically 2 (two) times daily. (Patient not taking: Reported on 08/12/2021) 30 g 0    Drug Regimen Review Drug regimen was reviewed and remains appropriate with no significant issues identified  Home: Home Living Family/patient expects to be discharged to:: Private residence Living Arrangements: Spouse/significant other, Children Available Help at Discharge: Family, Available 24 hours/day Type of Home: House Home Access: Stairs to enter [employer] of Steps: 1 Entrance Stairs-Rails: None Home Layout: Two  level, 1/2 bath on main level, Able to live on main level with bedroom/bathroom Alternate Level Stairs-Number of Steps: 12 Alternate Level Stairs-Rails: Right Bathroom Shower/Tub: Sponge bathes at baseline Bathroom Toilet: Standard Bathroom Accessibility: Yes Home Equipment: Conservation officer, nature (2 wheels), Rollator (4 wheels), Cane - single point, Wheelchair - manual, BSC/3in1 Additional Comments: dialysis MWF - [facility] transportation  Lives With: Spouse, Son (daughter-in-law)   Functional History: Prior Function Prior Level of Function : Needs assist  Cognitive Assist : ADLs (cognitive) ADLs (Cognitive): Intermittent cues Physical Assist : Mobility (physical), ADLs (physical) Mobility (physical): Bed mobility, Transfers ADLs (physical): Bathing, Dressing, Toileting, IADLs, Grooming Mobility Comments: wife reports sometimes  pt cooperates with slideboard, prior to hip fx the pt was ambulating 120 ft with prosthetic. performing sit-stand with supervision ADLs Comments: bathes in bed partially but that is due to bathroom setup on the main floor is half bath, then wife will do LB dressing in bed, then transfer to [facility] for remainder of bathing. has been doing transfers from [facility] to car.  Functional Status:  Mobility: Bed Mobility Overal bed mobility: Needs Assistance Bed Mobility: Rolling, Supine to Sit, Sit to Supine Rolling: Min assist Sidelying to sit: Min assist Supine to sit: Mod assist, HOB elevated Sit to supine: Mod assist Sit to sidelying: Min assist General bed mobility comments: truncal assist and assist to pivot hips to full position. pt utilized bed rail, OT utilized bed pad. Transfers General transfer comment: worked on unupported sitting tolerance and positioning in preparation for attempting lateral scoot. Will need +2 assist and drop arm/flat surface.      ADL: ADL Overall ADL's : Needs assistance/impaired Eating/Feeding: Set up Eating/Feeding Details (indicate cue type and  reason): bed level eating lunch - wife reports needs very chopped meals and if given the right texture can eat without assistance. if meals are not chopped well he will pocket food and further cues Grooming: Wash/dry face, Set up Upper Body Bathing: Minimal assistance Lower Body Bathing: Maximal assistance Upper Body Dressing : Minimal assistance Lower Body Dressing: Maximal assistance General ADL Comments: worked on bed mobility and sitting balance initially in unsupported sitting then provided support to try to work on coming out of trunk flexion. BUE and trunk flexed position. Mod A for supine<>EOB. Pt able to scoot up in bed in supine with multimodal cues for hand placement on head board rail.  Cognition: Cognition Overall Cognitive Status: History of cognitive impairments - at baseline Orientation Level: Oriented X4 Cognition Arousal/Alertness: Awake/alert Behavior During Therapy: Flat affect Overall Cognitive Status: History of cognitive impairments - at baseline General Comments: per wife, pt seems to be at cognitive baseline. was able to answer basic questions.  Physical Exam: Blood pressure 116/66, pulse 83, temperature 98.1 F (36.7 C), temperature source Oral, resp. rate (!) 21, height 5\' 9"  (1.753 m), weight 49.1 kg, SpO2 97 %. Physical Exam Skin:    Comments: Left AKA is dressed with wound VAC in place.  Right BKA well-healed.  Neurological:     Comments: Patient is nonverbal.  Makes eye contact with examiner.  He follows some very minimal commands.    Results for orders placed or performed during the hospital encounter of 08/19/21 (from the past 48 hour(s))  Glucose, capillary     Status: Abnormal   Collection Time: 08/22/21 12:02 PM  Result Value Ref Range   Glucose-Capillary 150 (H) 70 - 99 mg/dL    Comment: Glucose reference range applies only to samples taken after fasting for at least 8 hours.  Glucose, capillary     Status: Abnormal   Collection Time: 08/22/21   4:35 PM  Result Value Ref Range   Glucose-Capillary 231 (H) 70 - 99 mg/dL    Comment: Glucose reference range applies only to samples taken after fasting for at least 8 hours.  Glucose, capillary     Status: Abnormal   Collection Time: 08/22/21  7:49 PM  Result Value Ref Range   Glucose-Capillary 235 (H) 70 - 99 mg/dL    Comment: Glucose reference range applies only to samples taken after fasting for at least 8 hours.   Comment 1 Notify RN  Comment 2 Document in Chart   Comprehensive metabolic panel     Status: Abnormal   Collection Time: 08/23/21  1:51 AM  Result Value Ref Range   Sodium 130 (L) 135 - 145 mmol/L   Potassium 5.6 (H) 3.5 - 5.1 mmol/L   Chloride 94 (L) 98 - 111 mmol/L   CO2 26 22 - 32 mmol/L   Glucose, Bld 145 (H) 70 - 99 mg/dL    Comment: Glucose reference range applies only to samples taken after fasting for at least 8 hours.   BUN 65 (H) 8 - 23 mg/dL   Creatinine, Ser 7.93 (H) 0.61 - 1.24 mg/dL   Calcium 8.5 (L) 8.9 - 10.3 mg/dL   Total Protein 5.6 (L) 6.5 - 8.1 g/dL   Albumin 2.4 (L) 3.5 - 5.0 g/dL   AST 13 (L) 15 - 41 U/L   ALT <5 0 - 44 U/L   Alkaline Phosphatase 46 38 - 126 U/L   Total Bilirubin 0.3 0.3 - 1.2 mg/dL   GFR, Estimated 7 (L) >60 mL/min    Comment: (NOTE) Calculated using the CKD-EPI Creatinine Equation (2021)    Anion gap 10 5 - 15    Comment: Performed at [facility], [place] [address] [place], [place] [address]  Glucose, capillary     Status: Abnormal   Collection Time: 08/23/21  6:28 AM  Result Value Ref Range   Glucose-Capillary 113 (H) 70 - 99 mg/dL    Comment: Glucose reference range applies only to samples taken after fasting for at least 8 hours.  Phosphorus     Status: Abnormal   Collection Time: 08/23/21 10:02 AM  Result Value Ref Range   Phosphorus 5.4 (H) 2.5 - 4.6 mg/dL    Comment: Performed at [facility] [address], [place], [place] [address]  Glucose, capillary     Status: Abnormal   Collection Time:  08/23/21 11:47 AM  Result Value Ref Range   Glucose-Capillary 173 (H) 70 - 99 mg/dL    Comment: Glucose reference range applies only to samples taken after fasting for at least 8 hours.  Hepatitis B surface antibody     Status: None   Collection Time: 08/23/21  1:00 PM  Result Value Ref Range   Hep B S Ab NON REACTIVE NON REACTIVE    Comment: (NOTE) Inconsistent with immunity, less than 10 mIU/mL.  Performed at [facility], [place] [address] [place], [place] [address]   Hepatitis B surface antibody,quantitative     Status: Abnormal   Collection Time: 08/23/21  1:00 PM  Result Value Ref Range   Hepatitis B-Post 5.9 (L) Immunity>9.9 mIU/mL    Comment: (NOTE)  Status of Immunity                     Anti-HBs Level  ------------------                     -------------- Inconsistent with Immunity                   0.0 - 9.9 Consistent with Immunity                          >9.9 Performed At: [facility] [place], [place] [id number] Rush Farmer MD [id number]   Hepatitis B surface antigen     Status: None   Collection Time: 08/23/21  1:01 PM  Result Value Ref Range   Hepatitis B Surface Ag NON REACTIVE NON REACTIVE    Comment: Performed at [facility] [address], [place], [place] [address]  Glucose, capillary     Status: Abnormal   Collection Time: 08/23/21  4:14 PM  Result Value Ref Range   Glucose-Capillary 177 (H) 70 - 99 mg/dL    Comment: Glucose reference range applies only to samples taken after fasting for at least 8 hours.  Basic metabolic panel     Status: Abnormal   Collection Time: 08/23/21  7:20 PM  Result Value Ref Range   Sodium 134 (L) 135 - 145 mmol/L   Potassium 4.7 3.5 - 5.1 mmol/L   Chloride 94 (L) 98 - 111 mmol/L   CO2 27 22 - 32 mmol/L   Glucose, Bld 243 (H) 70 - 99 mg/dL    Comment: Glucose reference range applies only to samples taken after fasting for at least 8 hours.   BUN 43 (H) 8 - 23 mg/dL   Creatinine,  Ser 5.58 (H) 0.61 - 1.24 mg/dL   Calcium 8.1 (L) 8.9 - 10.3 mg/dL   GFR, Estimated 10 (L) >60 mL/min    Comment: (NOTE) Calculated using the CKD-EPI Creatinine Equation (2021)    Anion gap 13 5 - 15    Comment: Performed at [facility] [address], [place], [place] [address]  Magnesium     Status: None   Collection Time: 08/23/21  7:20 PM  Result Value Ref Range   Magnesium 2.1 1.7 - 2.4 mg/dL    Comment: Performed at [facility], [place] [address] [place], [place] [address]  Glucose, capillary     Status: Abnormal   Collection Time: 08/23/21  9:19 PM  Result Value Ref Range   Glucose-Capillary 214 (H) 70 - 99 mg/dL    Comment: Glucose reference range applies only to samples taken after fasting for at least 8 hours.  Glucose, capillary     Status: Abnormal   Collection Time: 08/24/21  6:21 AM  Result Value Ref Range   Glucose-Capillary 108 (H) 70 - 99 mg/dL    Comment: Glucose reference range applies only to samples taken after fasting for at least 8 hours.   No results found.     Medical Problem List and Plan: 1. 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  Patient using right prosthesis prior to admission  -patient may *** shower  -ELOS/Goals: *** 2.  Antithrombotics: -DVT/anticoagulation:  Pharmaceutical: Other (comment) Eliquis  -antiplatelet therapy: Aspirin 81 mg daily 3. Pain Management: Hydrocodone as needed 4. Mood: Melatonin as needed  -antipsychotic agents: N/A 5. Neuropsych: This patient is capable of making decisions on his own behalf. 6. Skin/Wound Care: Routine skin checks 7. Fluids/Electrolytes/Nutrition: Routine in and outs with follow-up chemistries 8.  End-stage renal disease with failed renal transplant maintained on low-dose chronic prednisone.  Continue hemodialysis 9.  Acute on chronic anemia.  Continue Aranesp.   Follow-up CBC 10.  Chronic diastolic congestive heart failure.  Ejection fraction 30-35%.  Monitor for any signs of fluid overload 11.  Hyperlipidemia.  Lipitor 12.  Diabetes mellitus with peripheral neuropathy.  Hemoglobin A1c 4.9.  SSI 13.  History of LV thrombosis.  Continue Eliquis. 14.  History of CVA.  Patient with residual aphasia.  Continue Eliquis and low-dose aspirin.  Lavon Paganini Berlene Dixson, PA-C 08/24/2021

## 2021-08-24 NOTE — Progress Notes (Signed)
Nephrology Follow-Up Consult note   Assessment/Recommendations: Jonathan LALOR Sr. is a/an 71 y.o. male with a past medical history significant for ESRD, admitted for AKA on 1/12.        OP HD: MWF [facility]  4h 35min  55kg  2/2 bath  Hep 3000  LU AVF   - hect 4 ug tiw   - mircera 150 q2, last 1/02 (due 1/16)  ESRD: Continue MWF schedule  Status post AKA 1/12: Per [facility]  HTN/Volume: below EDW likely w/ weight loss. BP good. Adjust Uf PRN  Anemia of CKD: Hgb 9.5 recently; recheck tomorrow; continue ESA 132mcg weekly.  H/o failed renal transplant: continue prednisone daily  Secondary Hyperparathyroidism: continue hecterol and binder. Phos and calcium acceptable  DM2: mgmt per primary  H/o CVA: aphasia and dementia     Recommendations conveyed to primary service.    [facility] 08/24/2021 9:30 AM  ___________________________________________________________  CC: ESRD  Interval History/Subjective: Patient tolerated dialysis yesterday. No specific complaints today.   Medications:  Current Facility-Administered Medications  Medication Dose Route Frequency Provider Last Rate Last Admin   allopurinol (ZYLOPRIM) tablet 100 mg  100 mg Oral QHS Wynetta Fines T, MD   100 mg at 08/23/21 2237   apixaban (ELIQUIS) tablet 5 mg  5 mg Oral BID Darliss Cheney, MD   5 mg at 08/24/21 0810   aspirin chewable tablet 81 mg  81 mg Oral Daily Wynetta Fines T, MD   81 mg at 08/24/21 0810   atorvastatin (LIPITOR) tablet 40 mg  40 mg Oral QHS Wynetta Fines T, MD   40 mg at 08/23/21 2237   Darbepoetin Alfa (ARANESP) injection 100 mcg  100 mcg Intravenous Q Mon-HD Kris Mouton, RPH   100 mcg at 08/23/21 1349   doxercalciferol (HECTOROL) injection 4 mcg  4 mcg Intravenous Q M,W,F-HD Roney Jaffe, MD   4 mcg at 29/56/21 3086   folic acid (FOLVITE) tablet 1 mg  1 mg Oral Daily Wynetta Fines T, MD   1 mg at 08/24/21 0810   HYDROcodone-acetaminophen (NORCO/VICODIN) 5-325 MG per  tablet 1 tablet  1 tablet Oral BID PRN Wynetta Fines T, MD   1 tablet at 08/19/21 2210   insulin aspart (novoLOG) injection 0-6 Units  0-6 Units Subcutaneous TID WC Wynetta Fines T, MD   2 Units at 08/22/21 1655   lactose free nutrition (BOOST PLUS) liquid 237 mL  237 mL Oral Daily PRN Reome, Earle J, RPH       levalbuterol (XOPENEX) nebulizer solution 0.63 mg  0.63 mg Inhalation Q8H PRN Pahwani, Einar Grad, MD       lidocaine-prilocaine (EMLA) cream 1 application  1 application Topical Once per day on Mon Wed Fri Zhang, Ping T, MD       melatonin tablet 5 mg  5 mg Oral QHS PRN Wynetta Fines T, MD   5 mg at 08/19/21 2210   ondansetron (ZOFRAN) tablet 4 mg  4 mg Oral Q6H PRN Wynetta Fines T, MD       Or   ondansetron [facility]) injection 4 mg  4 mg Intravenous Q6H PRN Wynetta Fines T, MD       polyethylene glycol (MIRALAX / GLYCOLAX) packet 17 g  17 g Oral Daily PRN Wynetta Fines T, MD   17 g at 08/21/21 2147   predniSONE (DELTASONE) tablet 10 mg  10 mg Oral Q breakfast Wynetta Fines T, MD   10 mg at 08/24/21 [phone]  sevelamer carbonate (RENVELA) tablet 800 mg  800 mg Oral TID WC Wynetta Fines T, MD   800 mg at 08/23/21 1722      ROS: Unable to obtain due to the patient's nonverbal status  Physical Exam: Vitals:   08/24/21 0423 08/24/21 0737  BP: 116/73 116/66  Pulse: 87 83  Resp: 16 (!) 21  Temp: 97.7 F (36.5 C) 98.1 F (36.7 C)  SpO2: 99% 97%   No intake/output data recorded.  Intake/Output Summary (Last 24 hours) at 08/24/2021 0930 Last data filed at 08/23/2021 1533 Gross per 24 hour  Intake 240 ml  Output 0 ml  Net 240 ml   Constitutional: well-appearing, no acute distress ENMT: ears and nose without scars or lesions, MMM CV: normal rate, no edema Respiratory: Bilateral chest rise, normal work of breathing Gastrointestinal: soft, non-tender, no palpable masses or hernias Skin: no visible lesions or rashes Psych: awake and alert   Test Results I personally reviewed new and old clinical labs  and radiology tests Lab Results  Component Value Date   NA 134 (L) 08/23/2021   K 4.7 08/23/2021   CL 94 (L) 08/23/2021   CO2 27 08/23/2021   BUN 43 (H) 08/23/2021   CREATININE 5.58 (H) 08/23/2021   CALCIUM 8.1 (L) 08/23/2021   ALBUMIN 2.4 (L) 08/23/2021   PHOS 5.4 (H) 08/23/2021

## 2021-08-25 LAB — GLUCOSE, CAPILLARY
Glucose-Capillary: 104 mg/dL — ABNORMAL HIGH (ref 70–99)
Glucose-Capillary: 154 mg/dL — ABNORMAL HIGH (ref 70–99)
Glucose-Capillary: 157 mg/dL — ABNORMAL HIGH (ref 70–99)

## 2021-08-25 MED ORDER — LIDOCAINE-PRILOCAINE 2.5-2.5 % EX CREA
1.0000 "application " | TOPICAL_CREAM | CUTANEOUS | Status: DC | PRN
  Filled 2021-08-25: qty 5

## 2021-08-25 NOTE — Progress Notes (Signed)
PROGRESS NOTE    Jonathan [name]  PFX:[id number] DOB: Oct 12, 1950 DOA: 08/19/2021 PCP: Ginger Organ., MD    No chief complaint on file.   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  Subjective:  Medically stable to discharge to cir, awaiting for insurance auth No acute interval changes He is sitting up in chair, + wound vac to left leg, right leg with prosthesis on He denies pain, he is calm and cooperative, he is not oriented to time, but knows he is in the hospital, wife at bedside   Assessment & Plan:   Principal Problem:   S/P AKA (above knee amputation) unilateral, left ([name]) Active Problems:   PVD (peripheral vascular disease) ([place])   Status post left AKA for left leg nonhealing wound -Wound/VAC care per vascular surgery. -PT/OT recommend CIR placement.  CIR consulted. Peer to peer review done this afternoon , talked to Dr Irene Shipper, awaiting on decision  -Continue pain management.  Fall precautions.   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  Hemodialysis as per nephrology schedule -Currently still on oral prednisone.   Diabetes mellitus type 2 with hyperglycemia, does not appear to be on insulin at home Was on insulin in the past but discontinued due to hypoglycemia -A1c 4.9, not sure is reliable in the setting of ESRD and anemia - Continue CBGs with SSI  Hyperlipidemia -Continue statin     Thrombocytopenia, mild on admission -Questionable cause.  No signs of bleeding.  Monitor intermittently  H/o LV thrombosis  -Eliquis has been already  resumed   History of unspecified stroke with residual aphasia/vascular dementia -not oriented to time, but to place and person, he is calm and cooperative -Continue aspirin, statin and Eliquis     underweight: Body mass index is 16.15 kg/m.Marland Kitchen Nutrition consult placed  Unresulted Labs (From admission, onward)     Start     Ordered   08/26/21 0500  CBC  Tomorrow morning,   R       Question:  Specimen collection method  Answer:  Lab=Lab collect   08/25/21 1640              DVT prophylaxis:  apixaban (ELIQUIS) tablet 5 mg   Code Status:full Family Communication: wife at bedside  Disposition:   Status is: Inpatient  Dispo: The patient is from: home              Anticipated d/c is to: CIR               Anticipated d/c date is: medically stable to d/c to cir, pending insurance auth                Consultants:  Vascular surgery nephrology  Procedures:  Left aka on 1/12  Antimicrobials:    Anti-infectives (From admission, onward)    Start     Dose/Rate Route Frequency Ordered Stop   08/19/21 0856  ceFAZolin (ANCEF) IVPB 2g/100 mL premix        2 g 200 mL/hr over 30 Minutes Intravenous 30 min pre-op 08/19/21 0856 08/19/21 1137  Objective: Vitals:   08/25/21 1500 08/25/21 1530 08/25/21 1600 08/25/21 1630  BP: 121/70 127/90 (!) 155/82 115/82  Pulse: 79     Resp: 15 17 17 15   Temp:      TempSrc:      SpO2:      Weight:      Height:        Intake/Output Summary (Last 24 hours) at 08/25/2021 1643 Last data filed at 08/25/2021 0645 Gross per 24 hour  Intake 240 ml  Output 0 ml  Net 240 ml   Filed Weights   08/23/21 1230 08/24/21 0423 08/25/21 1304  Weight: 50.3 kg 49.1 kg 49.6 kg    Examination:  General exam: alert, awake, communicative,calm, NAD Respiratory system: Clear to auscultation. Respiratory effort normal. Cardiovascular system:  RRR.  Gastrointestinal system: Abdomen is nondistended, soft and nontender.  Normal bowel sounds  heard. Central nervous system: Alert and orientedx2. No focal neurological deficits. Extremities:  right BKA, left AKA with wound vac Skin: No rashes, lesions or ulcers Psychiatry: calm and cooperative .     Data Reviewed: I have personally reviewed following labs and imaging studies  CBC: Recent Labs  Lab 08/19/21 0925 08/20/21 0444  WBC  --  8.0  HGB 16.3 9.5*  HCT 48.0 30.0*  MCV  --  91.5  PLT  --  126*    Basic Metabolic Panel: Recent Labs  Lab 08/19/21 0925 08/20/21 0444 08/23/21 0151 08/23/21 1002 08/23/21 1920  NA 136 134* 130*  --  134*  K 5.4* 4.4 5.6*  --  4.7  CL 97* 94* 94*  --  94*  CO2  --  29 26  --  27  GLUCOSE 105* 123* 145*  --  243*  BUN 40* 35* 65*  --  43*  CREATININE 4.90* 5.94* 7.93*  --  5.58*  CALCIUM  --  8.6* 8.5*  --  8.1*  MG  --   --   --   --  2.1  PHOS  --   --   --  5.4*  --     GFR: Estimated Creatinine Clearance: 8.9 mL/min (A) (by C-G formula based on SCr of 5.58 mg/dL (H)).  Liver Function Tests: Recent Labs  Lab 08/23/21 0151  AST 13*  ALT <5  ALKPHOS 46  BILITOT 0.3  PROT 5.6*  ALBUMIN 2.4*    CBG: Recent Labs  Lab 08/24/21 1114 08/24/21 1626 08/24/21 2126 08/25/21 0609 08/25/21 1107  GLUCAP 152* 192* 194* 104* 154*     Recent Results (from the past 240 hour(s))  SARS Coronavirus 2 (TAT 6-24 hrs)     Status: None   Collection Time: 08/17/21 12:00 AM  Result Value Ref Range Status   SARS Coronavirus 2 RESULT: NEGATIVE  Final    Comment: RESULT: NEGATIVESARS-CoV-2 INTERPRETATION:A NEGATIVE  test result means that SARS-CoV-2 RNA was not present in the specimen above the limit of detection of this test. This does not preclude a possible SARS-CoV-2 infection and should not be used as the  sole basis for patient management decisions. Negative results must be combined with clinical observations, patient history, and epidemiological information. Optimum specimen types and timing for peak viral levels during  infections caused by SARS-CoV-2  have not been determined. Collection of multiple specimens or types of specimens may be necessary to detect virus. Improper specimen collection and handling, sequence variability under primers/probes, or organism present below the limit of detection may  lead to false negative results.  Positive and negative predictive values of testing are highly dependent on prevalence. False negative test results are more likely when prevalence of disease is high.The expected result is NEGATIVE.Fact S heet for  Healthcare Providers: LocalChronicle.no Sheet for Patients: SalonLookup.es Reference Range - Negative          Radiology Studies: No results found.      Scheduled Meds:  allopurinol  100 mg Oral QHS   apixaban  5 mg Oral BID   aspirin  81 mg Oral Daily   atorvastatin  40 mg Oral QHS   darbepoetin (ARANESP) injection - DIALYSIS  100 mcg Intravenous Q Mon-HD   doxercalciferol  4 mcg Intravenous Q M,W,F-HD   folic acid  1 mg Oral Daily   insulin aspart  0-6 Units Subcutaneous TID WC   predniSONE  10 mg Oral Q breakfast   sevelamer carbonate  800 mg Oral TID WC   Continuous Infusions:   LOS: 6 days   Time spent: 36mins Greater than 50% of this time was spent in counseling, explanation of diagnosis, planning of further management, and coordination of care.   Voice Recognition Viviann Spare dictation system was used to create this note, attempts have been made to correct errors. Please contact the author with questions and/or clarifications.   Florencia Reasons, MD PhD FACP [facility]  Available via Epic secure chat 7am-7pm for nonurgent issues Please page for urgent issues To page the attending provider between 7A-7P or the covering provider during after hours 7P-7A, please log into the web site [email or web address] and access using universal [place] password for that web site. If you do not have the  password, please call the hospital operator.    08/25/2021, 4:43 PM

## 2021-08-25 NOTE — Progress Notes (Addendum)
°  Progress Note    08/25/2021 7:16 AM 6 Days Post-Op  Subjective:  says he has some pain.   Tm 99.2 now afebrile Vitals:   08/24/21 2311 08/25/21 0421  BP: (!) 147/88 119/70  Pulse: 87 81  Resp: 18 14  Temp: 97.6 F (36.4 C) 97.9 F (36.6 C)  SpO2: 100% 99%    Physical Exam: Incisions:  dressing in place with vac.  Minimal output from vac.    CBC    Component Value Date/Time   WBC 8.0 08/20/2021 0444   RBC 3.28 (L) 08/20/2021 0444   HGB 9.5 (L) 08/20/2021 0444   HCT 30.0 (L) 08/20/2021 0444   PLT 126 (L) 08/20/2021 0444   MCV 91.5 08/20/2021 0444   MCH 29.0 08/20/2021 0444   MCHC 31.7 08/20/2021 0444   RDW 19.5 (H) 08/20/2021 0444   LYMPHSABS 1.2 06/25/2021 0931   MONOABS 0.8 06/25/2021 0931   EOSABS 0.2 06/25/2021 0931   BASOSABS 0.0 06/25/2021 0931    BMET    Component Value Date/Time   NA 134 (L) 08/23/2021 1920   K 4.7 08/23/2021 1920   CL 94 (L) 08/23/2021 1920   CO2 27 08/23/2021 1920   GLUCOSE 243 (H) 08/23/2021 1920   BUN 43 (H) 08/23/2021 1920   CREATININE 5.58 (H) 08/23/2021 1920   CALCIUM 8.1 (L) 08/23/2021 1920   GFRNONAA 10 (L) 08/23/2021 1920   GFRAA 13 (L) 09/22/2019 0733    INR    Component Value Date/Time   INR 1.3 (H) 06/04/2021 0459     Intake/Output Summary (Last 24 hours) at 08/25/2021 0716 Last data filed at 08/25/2021 0449 Gross per 24 hour  Intake 617 ml  Output 0 ml  Net 617 ml     Assessment/Plan:  71 y.o. male is s/p left above knee amputation  6 Days Post-Op  -dressing with vac in place.  Plan to take down dressing tomorrow. -PT/OT recommending CIR.  CIR has begun authorization for admission   Leontine Locket, [facility] [phone] 08/25/2021 7:16 AM   VASCULAR STAFF ADDENDUM: I have independently interviewed and examined the patient. I agree with the above.   [name], MD [facility] Phone Number: [phone] 08/25/2021 12:28  PM

## 2021-08-25 NOTE — Progress Notes (Signed)
Inpatient Rehabilitation Admissions Coordinator   I await insurance approval for possible CIR admit.  Danne Baxter, RN, MSN Rehab Admissions Coordinator [phone] 08/25/2021 11:07 AM

## 2021-08-25 NOTE — Progress Notes (Signed)
Physical Therapy Treatment Patient Details Name: Jonathan DEBOIS Sr. MRN: [id number] DOB: [birth year] Today's Date: 08/25/2021   History of Present Illness The pt is a 71 yo male presenting for elective L AKA for management of worsening L heel ulcer and after L hip fx requiring L TKA. PMH includes: ESRD on HD MWF s/p kidney transplant and subsequent failure, HTN, HLD, DM, PVD, and R BKA.    PT Comments    Pt steadily progressing towards all goals. Pt more verbal today and was able to consistently follow commands to participate in therapy session. Wife brought in R prosthetic leg. PT donned prosthesis and pt was able to tolerate lateral scooting along EOB and std pvt to chair. Pt with noted good UE arm strength to push self up. Instructed pt's spouse on how stretch pt's chest via pushing on anterior shoulder as pt with strong tendency to flex forward and protract shoulders. Pt to strongly benefit from AIR upon d/c to achieve safe w/c level of function for safe transition home with spouse. Acute PT to cont to follow.    Recommendations for follow up therapy are one component of a multi-disciplinary discharge planning process, led by the attending physician.  Recommendations may be updated based on patient status, additional functional criteria and insurance authorization.  Follow Up Recommendations  Acute inpatient rehab (3hours/day)     Assistance Recommended at Discharge Frequent or constant Supervision/Assistance  Patient can return home with the following Two people to help with walking and/or transfers;Assistance with cooking/housework;Two people to help with bathing/dressing/bathroom;Direct supervision/assist for medications management;Direct supervision/assist for financial management;Assist for transportation;Help with stairs or ramp for entrance   Equipment Recommendations       Recommendations for Other Services OT consult;Rehab consult     Precautions / Restrictions  Precautions Precautions: Fall Precaution Comments: new L AKA, previous R BKA Restrictions Weight Bearing Restrictions: Yes RLE Weight Bearing: Weight bearing as tolerated (with prosthesis) LLE Weight Bearing: Non weight bearing     Mobility  Bed Mobility Overal bed mobility: Needs Assistance Bed Mobility: Supine to Sit     Supine to sit: Min assist, Mod assist     General bed mobility comments: pt initiated transfer, minA for trunk elevation, min/modA to maintain EOB balance    Transfers Overall transfer level: Needs assistance Equipment used: 2 person hand held assist (face to face transfer with gait belt and prosthetic R LE) Transfers: Bed to chair/wheelchair/BSC   Stand pivot transfers: Mod assist, +2 safety/equipment        Lateral/Scoot Transfers: Min assist General transfer comment: pt quick to scoot self down EOB, able to lift self with bilat UEs and clear bottom with minimal assist, pt modAX2 for std pvt transfer into chair, pt held onto PT at hips, PT blocked R prosthesis and pt able to push up and lift bottom to get into chair, 2nd person for line management and to hold chair    Ambulation/Gait               General Gait Details: deferred today   Stairs             Wheelchair Mobility    Modified Rankin (Stroke Patients Only)       Balance Overall balance assessment: Needs assistance Sitting-balance support: Feet supported, Bilateral upper extremity supported (pt requiring modA when sitting without R prosthetic leg, with noted posterior lean) Sitting balance-Leahy Scale: Poor Sitting balance - Comments: once prosthetic LE donned pt close min guard/minA to maintain  EOB balance, pt with tendency to lean forward posing increased risk of falling forward                                    Cognition Arousal/Alertness: Awake/alert Behavior During Therapy: Flat affect Overall Cognitive Status: History of cognitive impairments - at  baseline                                 General Comments: pt more vocal compared to preivous sessions, pt following commands consistently, once given command quick to move/impulsive with decreased safety awareness, pt doesn't initiate converstation but will answer simple yes/no questions        Exercises Amputee Exercises Hip Flexion/Marching: AROM, Both, 10 reps, Seated Knee Extension: AROM, Right, 15 reps, Seated (with prosthesis on) Chair Push Up: AROM, Both, Standing, 5 reps    General Comments General comments (skin integrity, edema, etc.): VSS on RA, wound vac intact      Pertinent Vitals/Pain Pain Assessment Pain Assessment: Faces Faces Pain Scale: Hurts little more Pain Location: L residual limb to touch Pain Descriptors / Indicators: Grimacing Pain Intervention(s): Monitored during session    Home Living                          Prior Function            PT Goals (current goals can now be found in the care plan section) Progress towards PT goals: Progressing toward goals    Frequency    Min 3X/week      PT Plan Current plan remains appropriate    Co-evaluation              AM-PAC PT "6 Clicks" Mobility   Outcome Measure  Help needed turning from your back to your side while in a flat bed without using bedrails?: A Little Help needed moving from lying on your back to sitting on the side of a flat bed without using bedrails?: A Little Help needed moving to and from a bed to a chair (including a wheelchair)?: A Lot Help needed standing up from a chair using your arms (e.g., wheelchair or bedside chair)?: A Lot Help needed to walk in hospital room?: Total Help needed climbing 3-5 steps with a railing? : Total 6 Click Score: 12    End of Session Equipment Utilized During Treatment: Gait belt Activity Tolerance: Patient tolerated treatment well Patient left: in chair;with call bell/phone within reach;with chair alarm  set Nurse Communication: Mobility status PT Visit Diagnosis: Other abnormalities of gait and mobility (R26.89);Muscle weakness (generalized) (M62.81)     Time: 5400-8676 PT Time Calculation (min) (ACUTE ONLY): 32 min  Charges:  $Therapeutic Exercise: 8-22 mins $Therapeutic Activity: 8-22 mins                     Kittie Plater, PT, DPT Acute Rehabilitation Services Pager #: [phone] Office #: [phone]    Berline Lopes 08/25/2021, 9:59 AM

## 2021-08-25 NOTE — Progress Notes (Signed)
Inpatient Rehabilitation Admissions Coordinator   Insurance has denied CIR admit. I spoke with his wife by phone and she is aware. She would like to see if he can go to [facility] for he was there after his last amputation. I have alerted acute team and TOC. We will sign off at this time.  Danne Baxter, RN, MSN Rehab Admissions Coordinator [phone] 08/25/2021 5:54 PM

## 2021-08-25 NOTE — Progress Notes (Signed)
Nephrology Follow-Up Consult note   Assessment/Recommendations: Jonathan TIPPY Sr. is a/an 71 y.o. male with a past medical history significant for ESRD, admitted for AKA on 1/12.        OP HD: MWF [facility]  4h 23min  55kg  2/2 bath  Hep 3000  LU AVF   - hect 4 ug tiw   - mircera 150 q2, last 1/02 (due 1/16)  ESRD: Continue MWF schedule  Status post AKA 1/12: Per [facility]  HTN/Volume: below EDW likely w/ weight loss. BP good. Adjust Uf PRN  Anemia of CKD: Hgb 9.5 recently; recheck with dialysis today; continue ESA 183mcg weekly.  H/o failed renal transplant: continue prednisone daily  Secondary Hyperparathyroidism: continue hecterol and binder. Phos and calcium acceptable  DM2: mgmt per primary  H/o CVA: aphasia and dementia     Recommendations conveyed to primary service.    [facility] 08/25/2021 10:19 AM  ___________________________________________________________  CC: ESRD  Interval History/Subjective: No complaints today.   Medications:  Current Facility-Administered Medications  Medication Dose Route Frequency Provider Last Rate Last Admin   allopurinol (ZYLOPRIM) tablet 100 mg  100 mg Oral QHS Wynetta Fines T, MD   100 mg at 08/24/21 2202   apixaban (ELIQUIS) tablet 5 mg  5 mg Oral BID Darliss Cheney, MD   5 mg at 08/25/21 6295   aspirin chewable tablet 81 mg  81 mg Oral Daily Wynetta Fines T, MD   81 mg at 08/25/21 0843   atorvastatin (LIPITOR) tablet 40 mg  40 mg Oral QHS Wynetta Fines T, MD   40 mg at 08/24/21 2202   Darbepoetin Alfa (ARANESP) injection 100 mcg  100 mcg Intravenous Q Mon-HD Kris Mouton, RPH   100 mcg at 08/23/21 1349   doxercalciferol (HECTOROL) injection 4 mcg  4 mcg Intravenous Q M,W,F-HD Roney Jaffe, MD   4 mcg at 28/41/32 4401   folic acid (FOLVITE) tablet 1 mg  1 mg Oral Daily Wynetta Fines T, MD   1 mg at 08/25/21 0843   HYDROcodone-acetaminophen (NORCO/VICODIN) 5-325 MG per tablet 1 tablet  1 tablet Oral BID  PRN Wynetta Fines T, MD   1 tablet at 08/25/21 0940   insulin aspart (novoLOG) injection 0-6 Units  0-6 Units Subcutaneous TID WC Wynetta Fines T, MD   1 Units at 08/24/21 1724   lactose free nutrition (BOOST PLUS) liquid 237 mL  237 mL Oral Daily PRN Reome, Earle J, RPH       levalbuterol (XOPENEX) nebulizer solution 0.63 mg  0.63 mg Inhalation Q8H PRN Pahwani, Einar Grad, MD       lidocaine-prilocaine (EMLA) cream 1 application  1 application Topical PRN Florencia Reasons, MD       melatonin tablet 5 mg  5 mg Oral QHS PRN Wynetta Fines T, MD   5 mg at 08/19/21 2210   ondansetron (ZOFRAN) tablet 4 mg  4 mg Oral Q6H PRN Wynetta Fines T, MD       Or   ondansetron [facility]) injection 4 mg  4 mg Intravenous Q6H PRN Wynetta Fines T, MD       polyethylene glycol (MIRALAX / GLYCOLAX) packet 17 g  17 g Oral Daily PRN Wynetta Fines T, MD   17 g at 08/21/21 2147   predniSONE (DELTASONE) tablet 10 mg  10 mg Oral Q breakfast Wynetta Fines T, MD   10 mg at 08/25/21 0842   sevelamer carbonate (RENVELA) tablet 800 mg  800 mg  Oral TID WC Wynetta Fines T, MD   800 mg at 08/25/21 0842      ROS: Unable to obtain due to the patient's nonverbal status  Physical Exam: Vitals:   08/25/21 0421 08/25/21 0847  BP: 119/70 135/74  Pulse: 81 80  Resp: 14 14  Temp: 97.9 F (36.6 C) 97.9 F (36.6 C)  SpO2: 99% 99%   No intake/output data recorded.  Intake/Output Summary (Last 24 hours) at 08/25/2021 1019 Last data filed at 08/25/2021 0645 Gross per 24 hour  Intake 377 ml  Output 0 ml  Net 377 ml   Constitutional: well-appearing, no acute distress ENMT: ears and nose without scars or lesions, MMM CV: normal rate, no edema Respiratory: Bilateral chest rise, normal work of breathing Gastrointestinal: soft, non-tender, no palpable masses or hernias Skin: no visible lesions or rashes Psych: awake and alert   Test Results I personally reviewed new and old clinical labs and radiology tests Lab Results  Component Value Date   NA 134 (L)  08/23/2021   K 4.7 08/23/2021   CL 94 (L) 08/23/2021   CO2 27 08/23/2021   BUN 43 (H) 08/23/2021   CREATININE 5.58 (H) 08/23/2021   CALCIUM 8.1 (L) 08/23/2021   ALBUMIN 2.4 (L) 08/23/2021   PHOS 5.4 (H) 08/23/2021

## 2021-08-26 DIAGNOSIS — E43 Unspecified severe protein-calorie malnutrition: Secondary | ICD-10-CM | POA: Insufficient documentation

## 2021-08-26 LAB — CBC
HCT: 27.2 % — ABNORMAL LOW (ref 39.0–52.0)
Hemoglobin: 8.4 g/dL — ABNORMAL LOW (ref 13.0–17.0)
MCH: 27.7 pg (ref 26.0–34.0)
MCHC: 30.9 g/dL (ref 30.0–36.0)
MCV: 89.8 fL (ref 80.0–100.0)
Platelets: 225 10*3/uL (ref 150–400)
RBC: 3.03 MIL/uL — ABNORMAL LOW (ref 4.22–5.81)
RDW: 19.6 % — ABNORMAL HIGH (ref 11.5–15.5)
WBC: 7 10*3/uL (ref 4.0–10.5)
nRBC: 0 % (ref 0.0–0.2)

## 2021-08-26 LAB — GLUCOSE, CAPILLARY
Glucose-Capillary: 209 mg/dL — ABNORMAL HIGH (ref 70–99)
Glucose-Capillary: 94 mg/dL (ref 70–99)
Glucose-Capillary: 160 mg/dL — ABNORMAL HIGH (ref 70–99)
Glucose-Capillary: 241 mg/dL — ABNORMAL HIGH (ref 70–99)
Glucose-Capillary: 213 mg/dL — ABNORMAL HIGH (ref 70–99)
Glucose-Capillary: 188 mg/dL — ABNORMAL HIGH (ref 70–99)

## 2021-08-26 LAB — IRON AND TIBC
Iron: 33 ug/dL — ABNORMAL LOW (ref 45–182)
Saturation Ratios: 19 % (ref 17.9–39.5)
TIBC: 171 ug/dL — ABNORMAL LOW (ref 250–450)
UIBC: 138 ug/dL

## 2021-08-26 LAB — FERRITIN: Ferritin: 653 ng/mL — ABNORMAL HIGH (ref 24–336)

## 2021-08-26 MED ORDER — SENNOSIDES-DOCUSATE SODIUM 8.6-50 MG PO TABS
1.0000 | ORAL_TABLET | Freq: Two times a day (BID) | ORAL | Status: DC
  Administered 2021-08-26 – 2021-08-28 (×4): 1 via ORAL
  Filled 2021-08-26 (×5): qty 1

## 2021-08-26 MED ORDER — PREDNISONE 5 MG PO TABS
5.0000 mg | ORAL_TABLET | Freq: Every day | ORAL | Status: DC
  Administered 2021-08-28: 5 mg via ORAL
  Filled 2021-08-26: qty 1

## 2021-08-26 MED ORDER — RENA-VITE PO TABS
1.0000 | ORAL_TABLET | Freq: Every day | ORAL | Status: DC
  Administered 2021-08-26 – 2021-08-28 (×3): 1 via ORAL
  Filled 2021-08-26 (×3): qty 1

## 2021-08-26 MED ORDER — SODIUM CHLORIDE 0.9 % IV SOLN
250.0000 mg | Freq: Every day | INTRAVENOUS | Status: AC
  Administered 2021-08-27 – 2021-08-28 (×2): 250 mg via INTRAVENOUS
  Filled 2021-08-26 (×2): qty 20

## 2021-08-26 NOTE — Progress Notes (Signed)
Nephrology Follow-Up Consult note   Assessment/Recommendations: Jonathan LITAKER Sr. is a/an 71 y.o. male with a past medical history significant for ESRD, admitted for AKA on 1/12.        OP HD: MWF [facility]  4h 33min  55kg  2/2 bath  Hep 3000  LU AVF   - hect 4 ug tiw   - mircera 150 q2, last 1/02 (due 1/16)  ESRD: Continue MWF schedule  Status post AKA 1/12: Per [facility]  HTN/Volume: below EDW likely w/ weight loss. BP good. Adjust Uf PRN  Anemia of CKD: Hgb 8.4; check iron studies. continue ESA 132mcg weekly.  H/o failed renal transplant: continue prednisone daily  Secondary Hyperparathyroidism: continue hecterol and binder. Phos and calcium acceptable  DM2: mgmt per primary  H/o CVA: aphasia and dementia     Recommendations conveyed to primary service.    [facility] 08/26/2021 10:39 AM  ___________________________________________________________  CC: ESRD  Interval History/Subjective: Patient sitting in bed with no concerns or questions today. Tolerated dialysis yesterday with no issues   Medications:  Current Facility-Administered Medications  Medication Dose Route Frequency Provider Last Rate Last Admin   allopurinol (ZYLOPRIM) tablet 100 mg  100 mg Oral QHS Wynetta Fines T, MD   100 mg at 08/25/21 2158   apixaban (ELIQUIS) tablet 5 mg  5 mg Oral BID Darliss Cheney, MD   5 mg at 08/26/21 0859   aspirin chewable tablet 81 mg  81 mg Oral Daily Wynetta Fines T, MD   81 mg at 08/26/21 0859   atorvastatin (LIPITOR) tablet 40 mg  40 mg Oral QHS Wynetta Fines T, MD   40 mg at 08/25/21 2158   Darbepoetin Alfa (ARANESP) injection 100 mcg  100 mcg Intravenous Q Mon-HD Kris Mouton, RPH   100 mcg at 08/23/21 1349   doxercalciferol (HECTOROL) injection 4 mcg  4 mcg Intravenous Q M,W,F-HD Roney Jaffe, MD   4 mcg at 09/60/45 4098   folic acid (FOLVITE) tablet 1 mg  1 mg Oral Daily Wynetta Fines T, MD   1 mg at 08/26/21 0859   HYDROcodone-acetaminophen  (NORCO/VICODIN) 5-325 MG per tablet 1 tablet  1 tablet Oral BID PRN Wynetta Fines T, MD   1 tablet at 08/25/21 0940   insulin aspart (novoLOG) injection 0-6 Units  0-6 Units Subcutaneous TID WC Wynetta Fines T, MD   1 Units at 08/25/21 1857   lactose free nutrition (BOOST PLUS) liquid 237 mL  237 mL Oral Daily PRN Reome, Earle J, RPH       levalbuterol (XOPENEX) nebulizer solution 0.63 mg  0.63 mg Inhalation Q8H PRN Pahwani, Einar Grad, MD       lidocaine-prilocaine (EMLA) cream 1 application  1 application Topical PRN Florencia Reasons, MD       melatonin tablet 5 mg  5 mg Oral QHS PRN Wynetta Fines T, MD   5 mg at 08/19/21 2210   ondansetron (ZOFRAN) tablet 4 mg  4 mg Oral Q6H PRN Wynetta Fines T, MD       Or   ondansetron [facility]) injection 4 mg  4 mg Intravenous Q6H PRN Wynetta Fines T, MD       polyethylene glycol (MIRALAX / GLYCOLAX) packet 17 g  17 g Oral Daily PRN Wynetta Fines T, MD   17 g at 08/21/21 2147   predniSONE (DELTASONE) tablet 10 mg  10 mg Oral Q breakfast Wynetta Fines T, MD   10 mg at 08/26/21 [phone]  sevelamer carbonate (RENVELA) tablet 800 mg  800 mg Oral TID WC Wynetta Fines T, MD   800 mg at 08/26/21 0859      ROS: Unable to obtain due to the patient's nonverbal status  Physical Exam: Vitals:   08/26/21 0345 08/26/21 0815  BP: 109/69 112/64  Pulse: 86 80  Resp: 20 19  Temp: (!) 97.4 F (36.3 C) 98.5 F (36.9 C)  SpO2: 95% 96%   No intake/output data recorded.  Intake/Output Summary (Last 24 hours) at 08/26/2021 1039 Last data filed at 08/26/2021 0345 Gross per 24 hour  Intake 120 ml  Output 500 ml  Net -380 ml   Constitutional: well-appearing, no acute distress ENMT: ears and nose without scars or lesions, MMM CV: normal rate, no edema Respiratory: Bilateral chest rise, normal work of breathing Gastrointestinal: soft, non-tender, no palpable masses or hernias Skin: no visible lesions or rashes Psych: awake and alert   Test Results I personally reviewed new and old clinical labs  and radiology tests Lab Results  Component Value Date   NA 134 (L) 08/23/2021   K 4.7 08/23/2021   CL 94 (L) 08/23/2021   CO2 27 08/23/2021   BUN 43 (H) 08/23/2021   CREATININE 5.58 (H) 08/23/2021   CALCIUM 8.1 (L) 08/23/2021   ALBUMIN 2.4 (L) 08/23/2021   PHOS 5.4 (H) 08/23/2021

## 2021-08-26 NOTE — Progress Notes (Addendum)
°  Progress Note    08/26/2021 7:34 AM 7 Days Post-Op  Subjective:  no pain overnight   Vitals:   08/25/21 2317 08/26/21 0345  BP: 114/65 109/69  Pulse: 86 86  Resp: 18 20  Temp: 97.9 F (36.6 C) (!) 97.4 F (36.3 C)  SpO2: 100% 95%   Physical Exam: Lungs:  non labored Incisions:  L BKA pictured below Extremities:  incision healing well, minimal sanguinous ooz Neurologic: A&O     CBC    Component Value Date/Time   WBC 7.0 08/26/2021 0219   RBC 3.03 (L) 08/26/2021 0219   HGB 8.4 (L) 08/26/2021 0219   HCT 27.2 (L) 08/26/2021 0219   PLT 225 08/26/2021 0219   MCV 89.8 08/26/2021 0219   MCH 27.7 08/26/2021 0219   MCHC 30.9 08/26/2021 0219   RDW 19.6 (H) 08/26/2021 0219   LYMPHSABS 1.2 06/25/2021 0931   MONOABS 0.8 06/25/2021 0931   EOSABS 0.2 06/25/2021 0931   BASOSABS 0.0 06/25/2021 0931    BMET    Component Value Date/Time   NA 134 (L) 08/23/2021 1920   K 4.7 08/23/2021 1920   CL 94 (L) 08/23/2021 1920   CO2 27 08/23/2021 1920   GLUCOSE 243 (H) 08/23/2021 1920   BUN 43 (H) 08/23/2021 1920   CREATININE 5.58 (H) 08/23/2021 1920   CALCIUM 8.1 (L) 08/23/2021 1920   GFRNONAA 10 (L) 08/23/2021 1920   GFRAA 13 (L) 09/22/2019 0733    INR    Component Value Date/Time   INR 1.3 (H) 06/04/2021 0459     Intake/Output Summary (Last 24 hours) at 08/26/2021 0734 Last data filed at 08/26/2021 0345 Gross per 24 hour  Intake 120 ml  Output 500 ml  Net -380 ml     Assessment/Plan:  71 y.o. male is s/p L BKA 7 Days Post-Op   Dressing removed; wrap with kerlex and ACE due to slow sanguinous ooz Ok for [employer] when approved   Dagoberto Ligas, PA-C [facility] [phone] 08/26/2021 7:34 AM  VASCULAR STAFF ADDENDUM: I have independently interviewed and examined the patient. I agree with the above.  Ready for rehab. Incision healing well. Please call for questions.  [name], MD [facility] Phone Number: [phone] 08/26/2021 11:48 AM

## 2021-08-26 NOTE — Progress Notes (Signed)
Initial Nutrition Assessment  DOCUMENTATION CODES:   Severe malnutrition in context of chronic illness  INTERVENTION:   - Vital Cuisine Shake TID with meals, each supplement provides 520 kcal and 22 grams of protein  - Renal MVI daily  - Double protein portions TID with meals  - Remove Carb Modified diet restriction from pt's diet, verbal with readback order placed for a Renal diet with 1200 ml fluid restriction  NUTRITION DIAGNOSIS:   Severe Malnutrition related to chronic illness (ESRD, CHF, dementia) as evidenced by severe muscle depletion, severe fat depletion.  GOAL:   Patient will meet greater than or equal to 90% of their needs  MONITOR:   PO intake, Supplement acceptance, Labs, Weight trends, I & O's, Skin  REASON FOR ASSESSMENT:   Consult Assessment of nutrition requirement/status  ASSESSMENT:   71 year old male who presented on 1/12 for elective L AKA. PMH of ESRD on HD, s/p kidney transplant and transplant failure, CHF, CAD, LV thrombosis, HTN, HLD, T2DM, PVD s/p R BKA, stroke with residual aphasia, dementia.  01/12 - s/p L AKA  Last HD was on 1/18 with 500 ml net UF.  Spoke with pt's wife at bedside. Pt sleeping soundly in recliner at time of RD visit. Noted 100% completed lunch meal tray in pt's room (556 kcal, 24 grams of protein). Pt's wife confirms that pt ate all of his lunch. She states that pt has an excellent appetite and also eats well at home. Pt typically eats 2-3 meals daily ("regular food") and snacks throughout the day on items like cookies and fruit. Pt drinks Boost supplements at home but does not consume these daily. Pt does not tolerate Ensure or Nepro supplements due to GI distress after consuming them.  Pt's wife states that she thinks pt has been losing weight but is unsure of his UBW or how much weight he has lost. Reviewed weight history in chart. Pt's weight has fluctuated significantly over the last 2 years but overall trend is downward.  Difficult to determine how much of pt's weight loss can be attributed to fluid status vs weight loss s/p amputations vs true dry weight loss. Based on NFPE, pt meets criteria for severe malnutrition.  Pt's wife shares that pt has had issues with constipation in the past and therefore takes daily stool softeners at home. Pt's wife reports that sometimes pt will pocket food and this may get him "choked up" but overall doesn't have any issues chewing or swallowing.  Pt's wife amenable to trying [employer] Shakes with meals as vanilla Boost is not currently on formulary. RD will also order daily renal MVI and double protein portions with meals. Discussed removing Carb Modified diet restriction from pt's diet with MD who agreed. Verbal with readback order placed for a Renal diet with 1200 ml fluid restriction.  EDW: 55 kg (prior to L AKA) Admit weight: 62.6 kg Current weight: 49.6 kg  Meal Completion: 50-100%  Medications reviewed and include: aranesp weekly, hectorol with HD, folic acid, SSI, prednisone, renvela TID with meals  Labs reviewed: phosphorus 5.4 on 1/16, hemoglobin 8.4 CBG's: 94-209 x 24 hours  I/O's: -772 ml since admit  NUTRITION - FOCUSED PHYSICAL EXAM:  Flowsheet Row Most Recent Value  Orbital Region Severe depletion  Upper Arm Region Severe depletion  Thoracic and Lumbar Region Severe depletion  Buccal Region Severe depletion  Temple Region Severe depletion  Clavicle Bone Region Severe depletion  Clavicle and Acromion Bone Region Severe depletion  Scapular Bone Region Severe  depletion  Dorsal Hand Severe depletion  Patellar Region Unable to assess  Anterior Thigh Region Severe depletion  [RLE]  Posterior Calf Region Unable to assess  Edema (RD Assessment) None  Hair Reviewed  Eyes Reviewed  Mouth Reviewed  Skin Reviewed  Nails Reviewed       Diet Order:   Diet Order             Diet renal with fluid restriction Fluid restriction: 1200 mL Fluid; Room service  appropriate? Yes; Fluid consistency: Thin  Diet effective now                   EDUCATION NEEDS:   Education needs have been addressed  Skin:  Skin Assessment: Skin Integrity Issues: Wound VAC: L leg Other: skin tear scrotum  Last BM:  08/26/21 medium type 6  Height:   Ht Readings from Last 1 Encounters:  08/19/21 5\' 9"  (1.753 m)    Weight:   Wt Readings from Last 1 Encounters:  08/25/21 49.6 kg    BMI:  Body mass index is 16.15 kg/m.  Estimated Nutritional Needs:   Kcal:  1700-1900  Protein:  80-90 grams  Fluid:  1.7-1.9 L    Gustavus Bryant, MS, RD, LDN Inpatient Clinical Dietitian Please see AMiON for contact information.

## 2021-08-26 NOTE — Progress Notes (Signed)
PROGRESS NOTE    Jonathan [name]  WCB:[id number] DOB: [birth year] DOA: 08/19/2021 PCP: Ginger Organ., MD    No chief complaint on file.   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  Subjective:  Medically stable to discharge to SNF No acute interval changes He is sitting up in chair, wound vac to left stump removed,  right leg with prosthesis on He denies pain, he is calm and cooperative, he is not oriented to time, but knows he is in the hospital, wife at bedside   Assessment & Plan:   Principal Problem:   S/P AKA (above knee amputation) unilateral, left ([name]) Active Problems:   PVD (peripheral vascular disease) ([place])   Protein-calorie malnutrition, severe   Status post left AKA for left leg nonhealing wound -Wound/VAC care per vascular surgery. -PT/OT recommend CIR placement.  CIR consulted. Peer to peer review done this afternoon , talked to Dr Irene Shipper, awaiting on decision  -Continue pain management.  Fall precautions.   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  Hemodialysis as per nephrology schedule -Currently still on oral prednisone.   Diabetes mellitus type 2 with hyperglycemia, does not appear to be on insulin at home Was on insulin in the past but discontinued due to hypoglycemia -A1c 4.9, not sure is reliable in the setting of ESRD and anemia - Continue CBGs with SSI  Hyperlipidemia -Continue statin     H/o LV thrombosis  -Eliquis has been already resumed   History of unspecified stroke with residual aphasia/vascular dementia -not  oriented to time, but to place and person, he is calm and cooperative -Continue aspirin, statin and Eliquis     underweight: Body mass index is 16.15 kg/m.Marland Kitchen Nutrition consult placed  Unresulted Labs (From admission, onward)    None         DVT prophylaxis:  apixaban (ELIQUIS) tablet 5 mg   Code Status:full Family Communication: wife at bedside  Disposition:   Status is: Inpatient  Dispo: The patient is from: home              Anticipated d/c is to: SNF              Anticipated d/c date is: medically stable to SNF                Consultants:  Vascular surgery nephrology  Procedures:  Left aka on 1/12  Antimicrobials:    Anti-infectives (From admission, onward)    Start     Dose/Rate Route Frequency Ordered Stop   08/19/21 0856  ceFAZolin (ANCEF) IVPB 2g/100 mL premix        2 g 200 mL/hr over 30 Minutes Intravenous 30 min pre-op 08/19/21 0856 08/19/21 1137          Objective: Vitals:   08/26/21 0345 08/26/21 0815 08/26/21 1159 08/26/21 1508  BP: 109/69 112/64 132/90 125/68  Pulse: 86 80 94 91  Resp: 20 19 19 17   Temp: (!) 97.4 F (36.3 C) 98.5 F (36.9 C) 97.8 F (36.6 C) 98.3 F (36.8 C)  TempSrc: Oral Oral Axillary Oral  SpO2: 95% 96% 100% 100%  Weight:      Height:        Intake/Output Summary (Last 24 hours) at 08/26/2021 1827 Last data filed at 08/26/2021 0345 Gross per 24 hour  Intake 120 ml  Output 0 ml  Net 120 ml   Filed Weights   08/23/21 1230 08/24/21 0423 08/25/21 1304  Weight: 50.3 kg 49.1 kg 49.6 kg    Examination:  General exam: alert, awake, communicative,calm, NAD Respiratory system: Clear to auscultation. Respiratory effort normal. Cardiovascular system:  RRR.  Gastrointestinal system: Abdomen is nondistended, soft and nontender.  Normal bowel sounds heard. Central nervous system: Alert and orientedx2. No focal neurological deficits. Extremities:  right BKA, left AKA with wound vac Skin: No rashes, lesions or  ulcers Psychiatry: calm and cooperative .     Data Reviewed: I have personally reviewed following labs and imaging studies  CBC: Recent Labs  Lab 08/20/21 0444 08/26/21 0219  WBC 8.0 7.0  HGB 9.5* 8.4*  HCT 30.0* 27.2*  MCV 91.5 89.8  PLT 126* 786    Basic Metabolic Panel: Recent Labs  Lab 08/20/21 0444 08/23/21 0151 08/23/21 1002 08/23/21 1920  NA 134* 130*  --  134*  K 4.4 5.6*  --  4.7  CL 94* 94*  --  94*  CO2 29 26  --  27  GLUCOSE 123* 145*  --  243*  BUN 35* 65*  --  43*  CREATININE 5.94* 7.93*  --  5.58*  CALCIUM 8.6* 8.5*  --  8.1*  MG  --   --   --  2.1  PHOS  --   --  5.4*  --     GFR: Estimated Creatinine Clearance: 8.9 mL/min (A) (by C-G formula based on SCr of 5.58 mg/dL (H)).  Liver Function Tests: Recent Labs  Lab 08/23/21 0151  AST 13*  ALT <5  ALKPHOS 46  BILITOT 0.3  PROT 5.6*  ALBUMIN 2.4*    CBG: Recent Labs  Lab 08/25/21 2102 08/26/21 0611 08/26/21 1156 08/26/21 1510 08/26/21 1821  GLUCAP 209* 94 160* 241* 213*     Recent Results (from the past 240 hour(s))  SARS Coronavirus 2 (TAT 6-24 hrs)     Status: None   Collection Time: 08/17/21 12:00 AM  Result Value Ref Range Status   SARS Coronavirus 2 RESULT: NEGATIVE  Final    Comment: RESULT: NEGATIVESARS-CoV-2 INTERPRETATION:A NEGATIVE  test result means that SARS-CoV-2 RNA was not present in the specimen above the limit of detection of this test. This does not preclude a possible SARS-CoV-2 infection and should not be used as the  sole basis for patient management decisions. Negative results must be combined with clinical observations, patient history, and epidemiological information. Optimum specimen types and timing for peak viral levels during infections caused by SARS-CoV-2  have not been determined. Collection of multiple specimens or types of specimens may be necessary to detect virus. Improper specimen collection and handling, sequence variability under primers/probes,  or organism present below the limit of detection may  lead to false negative results. Positive and negative predictive values of testing are highly dependent on prevalence. False negative test results are more likely when prevalence of disease is high.The expected result is NEGATIVE.Fact S heet for  Healthcare Providers: LocalChronicle.no Sheet for Patients: [email or web address].es Reference Range - Negative          Radiology Studies: No results found.      Scheduled Meds:  allopurinol  100 mg Oral QHS   apixaban  5 mg Oral BID   aspirin  81 mg Oral Daily   atorvastatin  40 mg Oral QHS   darbepoetin (ARANESP) injection - DIALYSIS  100 mcg Intravenous Q Mon-HD   doxercalciferol  4 mcg Intravenous Q M,W,F-HD   folic acid  1 mg Oral Daily   insulin aspart  0-6 Units Subcutaneous TID WC   multivitamin  1 tablet Oral QHS   [START ON 08/27/2021] predniSONE  5 mg Oral Q breakfast   senna-docusate  1 tablet Oral BID   sevelamer carbonate  800 mg Oral TID WC   Continuous Infusions:   LOS: 7 days   Time spent: 62mins Greater than 50% of this time was spent in counseling, explanation of diagnosis, planning of further management, and coordination of care.   Voice Recognition Viviann Spare dictation system was used to create this note, attempts have been made to correct errors. Please contact the author with questions and/or clarifications.   Florencia Reasons, MD PhD FACP [facility]  Available via Epic secure chat 7am-7pm for nonurgent issues Please page for urgent issues To page the attending provider between 7A-7P or the covering provider during after hours 7P-7A, please log into the web site [email or web address] and access using universal [place] password for that web site. If you do not have the password, please call the hospital operator.    08/26/2021, 6:27 PM

## 2021-08-26 NOTE — NC FL2 (Signed)
[place] MEDICAID FL2 LEVEL OF CARE SCREENING TOOL     IDENTIFICATION  Patient Name: Jonathan GROCHOWSKI Sr. Birthdate: Dec 21, 1950 Sex: male Admission Date (Current Location): 08/19/2021  [facility] and [place] Number:  Herbalist and Address:  [facility] [place], [place] [address] [place], [place] [address]      Provider Number: [id number]  Attending Physician Name and Address:  Florencia Reasons, MD  Relative Name and Phone Number:       Current Level of Care: Hospital Recommended Level of Care: [place] Prior Approval Number:    Date Approved/Denied:   PASRR Number: [id number]  Discharge Plan: SNF    Current Diagnoses: Patient Active Problem List   Diagnosis Date Noted   PVD (peripheral vascular disease) ([place]) 08/19/2021   S/P AKA (above knee amputation) unilateral, left ([place]) 08/19/2021   Constipation 07/01/2021   Disrupted sleep-wake cycle 07/01/2021   Post-op pain 07/01/2021   Rectal bleeding    Closed left subtrochanteric femur fracture, sequela 06/10/2021   Hx of right BKA ([place]) 06/10/2021   Fall    CHF (congestive heart failure) ([place]) 06/04/2021   Hip fracture ([place]) 06/03/2021   Osteomyelitis ([place]) 10/30/2020   Encounter for general adult medical examination without abnormal findings 09/15/2020   Hemiplegia of dominant side ([place]) 09/15/2020   History of CVA (cerebrovascular accident) 09/15/2020   History of COVID-19 09/15/2020   Hypercoagulable state ([place]) 09/15/2020   Other chronic osteomyelitis, right ankle and foot ([place]) 09/15/2020   Right flank pain 09/15/2020   Atherosclerosis of native arteries of the extremities with ulceration ([place]) 08/05/2020   Encounter for removal of sutures 07/27/2020   Neuropathic ulcer of foot, unspecified laterality, limited to breakdown of skin ([place]) 06/09/2020   Anemia 01/16/2020   Headache, unspecified 12/03/2019   Other disorders of phosphorus metabolism 10/25/2019   Unspecified  protein-calorie malnutrition ([place]) 09/27/2019   FUO (fever of unknown origin) 09/17/2019   Cerebral thrombosis with cerebral infarction 08/02/2019   Cerebral embolism with cerebral infarction 08/02/2019   Acute respiratory failure due to COVID-19 [facility]) 07/30/2019   Acute respiratory failure with hypoxia ([place]) 07/30/2019   Allergy, unspecified, initial encounter 66/59/9357   Chronic systolic HF (heart failure) ([place]) 02/25/2019   ESRD (end stage renal disease) on dialysis ([place]) 02/25/2019   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 kidney disease, or end stage renal disease ([place]) 12/04/2018   Hypoglycemia, unspecified 08/17/2018   Generalized abdominal pain 07/16/2018   Hypercalcemia 06/28/2018   Arteriovenous fistula for hemodialysis in place, primary ([place]) 05/15/2018   Failed kidney transplant 05/15/2018   Anemia in chronic kidney disease 05/13/2018   Idiopathic gout, unspecified site 01/77/9390   Complication of vascular dialysis catheter 01/16/2018   Other specified coagulation defects ([name]) 01/16/2018   Pain, unspecified 01/16/2018   Pruritus, unspecified 01/16/2018   Secondary hyperparathyroidism of renal origin ([place]) 01/16/2018   Shortness of breath 01/16/2018   Type 2 diabetes mellitus with diabetic peripheral angiopathy without gangrene ([place]) 01/16/2018   Abnormal gait 01/10/2018   Dependence on renal dialysis ([place]) 01/10/2018   Iron deficiency anemia, unspecified 12/21/2017   Acute blood loss anemia 12/14/2017   Hypomagnesemia 12/03/2017   Anticoagulated 12/02/2017   Stroke ([place]) 11/29/2017   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[place]) 09/01/2016   Hyperlipidemia 09/01/2016   Long term (current) use of anticoagulants 09/01/2016   BK viremia 06/16/2015   At risk for infection transmitted from donor 06/16/2014   Ureteral stent displacement ([place]) 06/10/2014   Prophylactic antibiotic 06/09/2014   Immunosuppression ([place]) 06/06/2014    Orientation RESPIRATION BLADDER Height & Weight     Self, Time, Situation, Place  Normal Continent Weight: 109 lb 5.6 oz (49.6 kg) Height:  5\' 9"  (175.3 cm)  BEHAVIORAL SYMPTOMS/MOOD NEUROLOGICAL BOWEL NUTRITION STATUS      Continent  (please see discharge summary)  AMBULATORY STATUS COMMUNICATION OF NEEDS Skin   Supervision Verbally Surgical wounds (closed incision left leg, negative pressure wound therapy leg, anterior left , would/incision skin tear scrotum mid anterior are of skin on the front area.)                       Personal Care Assistance Level of Assistance  Bathing, Feeding, Dressing Bathing Assistance: Limited assistance Feeding assistance: Independent Dressing Assistance: Limited assistance     Functional Limitations Info  Hearing, Sight, Speech Sight Info: Adequate Hearing Info: Adequate Speech Info: Adequate    SPECIAL CARE FACTORS FREQUENCY  PT (By licensed PT), OT (By licensed OT)     PT Frequency: 5x per wek OT Frequency: 5x per week            Contractures Contractures Info: Not present    Additional Factors Info  Code Status, Allergies Code Status Info: FULL Allergies Info: NKA           Current Medications  (08/26/2021):  This is the current hospital active medication list Current Facility-Administered Medications  Medication Dose Route Frequency Provider Last Rate Last Admin   allopurinol (ZYLOPRIM) tablet 100 mg  100 mg Oral QHS Wynetta Fines T, MD   100 mg at 08/25/21 2158   apixaban (ELIQUIS) tablet 5 mg  5 mg Oral BID Darliss Cheney, MD   5 mg at 08/26/21 0859   aspirin chewable tablet 81 mg  81 mg Oral Daily Wynetta Fines T, MD   81 mg at 08/26/21 0859   atorvastatin (LIPITOR) tablet 40 mg  40 mg Oral QHS Wynetta Fines T, MD   40 mg at 08/25/21 2158   Darbepoetin Alfa (ARANESP) injection 100 mcg  100 mcg Intravenous Q Mon-HD Kris Mouton, RPH   100 mcg at 08/23/21 1349   doxercalciferol (HECTOROL) injection 4 mcg  4 mcg Intravenous Q M,W,F-HD Roney Jaffe, MD   4 mcg at 82/50/53 9767   folic acid (FOLVITE) tablet 1 mg  1 mg Oral Daily Wynetta Fines T, MD   1 mg at 08/26/21 0859   HYDROcodone-acetaminophen (NORCO/VICODIN) 5-325 MG per tablet 1 tablet  1 tablet Oral BID PRN Wynetta Fines T, MD   1 tablet at 08/25/21 0940   insulin aspart (novoLOG) injection 0-6 Units  0-6 Units Subcutaneous TID WC Lequita Halt, MD   1 Units at 08/25/21 1857   lactose free nutrition (BOOST PLUS) liquid  237 mL  237 mL Oral Daily PRN Reome, Earle J, RPH       levalbuterol (XOPENEX) nebulizer solution 0.63 mg  0.63 mg Inhalation Q8H PRN Pahwani, Ravi, MD       lidocaine-prilocaine (EMLA) cream 1 application  1 application Topical PRN Florencia Reasons, MD       melatonin tablet 5 mg  5 mg Oral QHS PRN Wynetta Fines T, MD   5 mg at 08/19/21 2210   ondansetron (ZOFRAN) tablet 4 mg  4 mg Oral Q6H PRN Wynetta Fines T, MD       Or   ondansetron [facility]) injection 4 mg  4 mg Intravenous Q6H PRN Wynetta Fines T, MD       polyethylene glycol (MIRALAX / GLYCOLAX) packet 17 g  17 g Oral Daily PRN Wynetta Fines T, MD   17 g at 08/21/21 2147   predniSONE (DELTASONE) tablet 10 mg  10 mg Oral Q breakfast Wynetta Fines T, MD   10 mg at 08/26/21 0859    sevelamer carbonate (RENVELA) tablet 800 mg  800 mg Oral TID WC Lequita Halt, MD   800 mg at 08/26/21 9417     Discharge Medications: Please see discharge summary for a list of discharge medications.  Relevant Imaging Results:  Relevant Lab Results:   Additional Information SSN [place], [place]

## 2021-08-26 NOTE — Progress Notes (Signed)
Mobility Specialist: Progress Note   08/26/21 1714  Mobility  Activity Transferred from chair to bed  Level of Assistance +2 (takes two people)  Assistive Device St[place]  Activity Response Tolerated poorly  $Mobility charge 1 Mobility   Pre-Mobility: 92 HR Post-Mobility: 90 HR  Pt required +2 max physical assist to stand in the stedy. Pt stood x3 to be assisted with pericare before returning to the bed. Only able to tolerate a few seconds of standing d/t general weakness and fatigue. Pt back in bed with call bell at his side.   [facility] Jonathan [name] Mobility Specialist Mobility Specialist 4 [place]: [phone] Mobility Specialist 2 [place] and [place]: [phone]

## 2021-08-26 NOTE — Progress Notes (Signed)
Occupational Therapy Treatment Patient Details Name: Jonathan WARRINER Sr. MRN: [id number] DOB: Nov 21, 1950 Today's Date: 08/26/2021   History of present illness The pt is a 71 yo male presenting for elective L AKA for management of worsening L heel ulcer and after L hip fx requiring L TKA. PMH includes: ESRD on HD MWF s/p kidney transplant and subsequent failure, HTN, HLD, DM, PVD, and R BKA.   OT comments  Pt progressing slowly towards acute OT goals. Received in long-sitting in bed. Pt completed lateral scoot to R side with RLE prosthetic on and max +2 physical and +2 safety assist. Pt up in recliner at end of session. Spouse present, chair alarm on.    Recommendations for follow up therapy are one component of a multi-disciplinary discharge planning process, led by the attending physician.  Recommendations may be updated based on patient status, additional functional criteria and insurance authorization.    Follow Up Recommendations  Acute inpatient rehab (3hours/day); noted insurance denial, need to maximize HH services: HH RN, [place] aide, [place] OT, HHPT   Assistance Recommended at Discharge Frequent or constant Supervision/Assistance  Patient can return home with the following  A lot of help with walking and/or transfers;A lot of help with bathing/dressing/bathroom;Assistance with cooking/housework;Assistance with feeding;Direct supervision/assist for medications management;Direct supervision/assist for financial management   Equipment Recommendations  Wheelchair (measurements OT);Wheelchair cushion (measurements OT);Hospital bed;Other (comment) (lift)    Recommendations for Other Services      Precautions / Restrictions Precautions Precautions: Fall Precaution Comments: new L AKA, previous R BKA (per wife pt has had limited training with R prosthetic) Restrictions Weight Bearing Restrictions: Yes RLE Weight Bearing: Weight bearing as tolerated LLE Weight Bearing: Non weight bearing        Mobility Bed Mobility Overal bed mobility: Needs Assistance Bed Mobility: Supine to Sit           General bed mobility comments: Pt in long sitting position in bed. max A up to +2 assist to scoot hips to full EOB position. difficulty sequencing for compensatory strategies (ie leaning to unweighting hip)    Transfers Overall transfer level: Needs assistance                Lateral/Scoot Transfers: Max assist, +2 safety/equipment General transfer comment: Pt with difficulty sequencing movements to scoot towards right side into recliner. Cued to push from mattress but each time pt switching to holding waist of therpist with left arm while struggling with placement of r hand on recliner dispite multimodal cues.     Balance Overall balance assessment: Needs assistance Sitting-balance support: Bilateral upper extremity supported Sitting balance-Leahy Scale: Poor Sitting balance - Comments: Maintains trunk flexed position, BUE support.                                   ADL either performed or assessed with clinical judgement   ADL Overall ADL's : Needs assistance/impaired                         Toilet Transfer: Maximal assistance;+2 for physical assistance (lateral scoot to drop arm recliner) Toilet Transfer Details (indicate cue type and reason): simulated with EOB to drop arm recliner.           General ADL Comments: Attempted standing with no success this session. Pt was able to assist minimally with lateral scoot. total A to don R prosthetic leg.  Extremity/Trunk Assessment Upper Extremity Assessment Upper Extremity Assessment: Generalized weakness   Lower Extremity Assessment Lower Extremity Assessment: Defer to PT evaluation        Vision       Perception     Praxis      Cognition Arousal/Alertness: Awake/alert Behavior During Therapy: Flat affect Overall Cognitive Status: History of cognitive impairments - at baseline                                  General Comments: able to answer basic., yes/no questions. follows one step commands most of the time.        Exercises      Shoulder Instructions       General Comments      Pertinent Vitals/ Pain       Pain Assessment Pain Assessment: Faces Faces Pain Scale: Hurts little more Pain Location: L residual limb to touch Pain Descriptors / Indicators: Grimacing Pain Intervention(s): Monitored during session, Limited activity within patient's tolerance, Repositioned  Home Living                                          Prior Functioning/Environment              Frequency  Min 3X/week        Progress Toward Goals  OT Goals(current goals can now be found in the care plan section)  Progress towards OT goals: Progressing toward goals  Acute Rehab OT Goals Patient Stated Goal: to get him to doing things better for home OT Goal Formulation: With patient/family Time For Goal Achievement: 09/03/21 Potential to Achieve Goals: Good ADL Goals Pt Will Perform Grooming: with set-up;sitting Pt Will Perform Upper Body Bathing: with set-up;sitting Additional ADL Goal #1: Pt will complete basic transfer sliding board Total+2 mod (A) Additional ADL Goal #2: Pt will complete posterior transfer into chair total +2 mod (A)  Plan Discharge plan needs to be updated    Co-evaluation                 AM-PAC OT "6 Clicks" Daily Activity     Outcome Measure   Help from another person eating meals?: None Help from another person taking care of personal grooming?: None Help from another person toileting, which includes using toliet, bedpan, or urinal?: A Lot Help from another person bathing (including washing, rinsing, drying)?: A Lot Help from another person to put on and taking off regular upper body clothing?: A Little Help from another person to put on and taking off regular lower body clothing?: A Lot 6 Click  Score: 17    End of Session    OT Visit Diagnosis: Muscle weakness (generalized) (M62.81)   Activity Tolerance Patient limited by fatigue   Patient Left in chair;with call bell/phone within reach;with chair alarm set;with family/visitor present (spouse present)   Nurse Communication Mobility status        Time: 6378-5885 OT Time Calculation (min): 20 min  Charges: OT General Charges $OT Visit: 1 Visit OT Treatments $Self Care/Home Management : 8-22 mins  Tyrone Schimke, OT Acute Rehabilitation Services Office: [phone]   Hortencia Pilar 08/26/2021, 12:16 PM

## 2021-08-26 NOTE — Progress Notes (Signed)
Noted pt is now for snf placement. Contacted CSW with pt's out-pt HD clinic/schedule for snf placement purposes. Will assist as needed.   Melven Sartorius Renal Navigator [phone]

## 2021-08-27 LAB — RENAL FUNCTION PANEL
Albumin: 2.4 g/dL — ABNORMAL LOW (ref 3.5–5.0)
Anion gap: 10 (ref 5–15)
BUN: 55 mg/dL — ABNORMAL HIGH (ref 8–23)
CO2: 26 mmol/L (ref 22–32)
Calcium: 8.7 mg/dL — ABNORMAL LOW (ref 8.9–10.3)
Chloride: 95 mmol/L — ABNORMAL LOW (ref 98–111)
Creatinine, Ser: 5.7 mg/dL — ABNORMAL HIGH (ref 0.61–1.24)
GFR, Estimated: 10 mL/min — ABNORMAL LOW (ref >60)
Glucose, Bld: 117 mg/dL — ABNORMAL HIGH (ref 70–99)
Phosphorus: 3.6 mg/dL (ref 2.5–4.6)
Potassium: 4.2 mmol/L (ref 3.5–5.1)
Sodium: 131 mmol/L — ABNORMAL LOW (ref 135–145)

## 2021-08-27 LAB — CBC
HCT: 25.1 % — ABNORMAL LOW (ref 39.0–52.0)
Hemoglobin: 8.2 g/dL — ABNORMAL LOW (ref 13.0–17.0)
MCH: 29.7 pg (ref 26.0–34.0)
MCHC: 32.7 g/dL (ref 30.0–36.0)
MCV: 90.9 fL (ref 80.0–100.0)
Platelets: 239 10*3/uL (ref 150–400)
RBC: 2.76 MIL/uL — ABNORMAL LOW (ref 4.22–5.81)
RDW: 20 % — ABNORMAL HIGH (ref 11.5–15.5)
WBC: 6.8 10*3/uL (ref 4.0–10.5)
nRBC: 0 % (ref 0.0–0.2)

## 2021-08-27 LAB — GLUCOSE, CAPILLARY
Glucose-Capillary: 144 mg/dL — ABNORMAL HIGH (ref 70–99)
Glucose-Capillary: 87 mg/dL (ref 70–99)
Glucose-Capillary: 151 mg/dL — ABNORMAL HIGH (ref 70–99)
Glucose-Capillary: 133 mg/dL — ABNORMAL HIGH (ref 70–99)

## 2021-08-27 MED ORDER — HEPARIN SODIUM (PORCINE) 1000 UNIT/ML IJ SOLN
INTRAMUSCULAR | Status: AC
  Administered 2021-08-27: 1200 [IU] via INTRAVENOUS
  Filled 2021-08-27: qty 2

## 2021-08-27 MED ORDER — HEPARIN SODIUM (PORCINE) 1000 UNIT/ML IJ SOLN: 1200.0000 [IU] | INTRAMUSCULAR | Status: DC | PRN

## 2021-08-27 NOTE — Progress Notes (Signed)
PT Cancellation Note  Patient Details Name: Jonathan KINKER Sr. MRN: [id number] DOB: 1951/02/08   Cancelled Treatment:    Reason Eval/Treat Not Completed: Patient at procedure or test/unavailable. Pt in HD. Will continue to follow.   Shary Decamp [facility] 08/27/2021, 9:24 AM [place] Pager [phone] Office [phone]

## 2021-08-27 NOTE — Progress Notes (Signed)
°   08/27/21 1123  Vitals  Temp 97.7 F (36.5 C)  Temp Source Oral  BP 126/69  BP Location Left Arm  BP Method Automatic  Patient Position (if appropriate) Lying  Pulse Rate 83  Pulse Rate Source Monitor  Resp 20  Oxygen Therapy  SpO2 98 %  O2 Device Room Air  Pain Assessment  Pain Scale 0-10  Pain Score 0  Dialysis Weight  Weight 48 kg  Type of Weight Post-Dialysis  Post-Hemodialysis Assessment  Rinseback Volume (mL) 250 mL  KECN 221 V  Dialyzer Clearance Lightly streaked  Duration of HD Treatment -hour(s) 3.5 hour(s)  Hemodialysis Intake (mL) 500 mL  UF Total -Machine (mL) 1000 mL  Net UF (mL) 500 mL  Tolerated HD Treatment Yes  Post-Hemodialysis Comments tx complete-pt stable  AVG/AVF Arterial Site Held (minutes) 7 minutes  AVG/AVF Venous Site Held (minutes) 7 minutes  Fistula / Graft Right Upper arm Arteriovenous vein graft  No placement date or time found.   Placed prior to admission: Yes  Orientation: Right  Access Location: Upper arm  Access Type: Arteriovenous vein graft  Site Condition No complications  Fistula / Graft Assessment Present;Thrill;Bruit  Status Deaccessed  Drainage Description None

## 2021-08-27 NOTE — Progress Notes (Signed)
Contacted by CSW that pt is for possible d/c tomorrow to [facility]. Contacted [place] and spoke to [place], Agricultural consultant. Clinic advised pt for possible d/c tomorrow and will resume on Monday if that is the case. Clinic also advised pt to d/c to [facility] with [facility] [place] to provide transportation per CSW.   Melven Sartorius Renal Navigator [phone]

## 2021-08-27 NOTE — Progress Notes (Signed)
Mobility Specialist: Progress Note   08/27/21 1534  Mobility  Bed Position Chair  Activity Transferred from bed to chair  Level of Assistance +2 (takes two people)  Counsellor  Activity Response Tolerated fair  $Mobility charge 1 Mobility   Pre-Mobility: 83 HR Post-Mobility: 88 HR  Pt required +2 assist for safety to stand in [place]. Frequent verbal cues for hand placement, to stand tall and tuck his hips in. With some physical assist pt was able to full stand on RLE to be assisted with pericare. Pt in recliner with call bell at his side. Will f/u later to assist back to bed.   [facility] Jaely Silman Mobility Specialist Mobility Specialist 4 [place]: [phone] Mobility Specialist 2 [place] and [place]: [phone]

## 2021-08-27 NOTE — Progress Notes (Incomplete)
Pt transferred back to 4E s/p hemodialysis.

## 2021-08-27 NOTE — Progress Notes (Signed)
Mobility Specialist: Progress Note   08/27/21 1740  Mobility  Activity Transferred from chair to bed  Level of Assistance +2 (takes two people)  Assistive Device Sara Lift  Activity Response Tolerated well  $Mobility charge 1 Mobility   Pt assisted back to bed with help from RN. No c/o throughout. RN present in the room.   [name] [name] Mobility Specialist Mobility Specialist 4 [place]: [phone] Mobility Specialist 2 [place] and [place]: [phone]

## 2021-08-27 NOTE — Progress Notes (Signed)
Nephrology Follow-Up Consult note   Assessment/Recommendations: Jonathan BEDOYA Sr. is a/an 71 y.o. male with a past medical history significant for ESRD, admitted for AKA on 1/12.        OP HD: MWF [facility]  4h 5min  55kg  2/2 bath  Hep 3000  LU AVF   - hect 4 ug tiw   - mircera 150 q2, last 1/02 (due 1/16)  ESRD: Continue MWF schedule  Status post AKA 1/12: Per [facility]  HTN/Volume: below EDW likely w/ weight loss. BP good. Adjust Uf PRN  Anemia of CKD: Hgb 8.2. Iron 250mg  x2 doses continue ESA 158mcg weekly.  H/o failed renal transplant: continue prednisone daily  Secondary Hyperparathyroidism: continue hecterol and binder. Phos and calcium acceptable  DM2: mgmt per primary  H/o CVA: aphasia and dementia     Recommendations conveyed to primary service.    [facility] 08/27/2021 10:29 AM  ___________________________________________________________  CC: ESRD  Interval History/Subjective: Patient lying in bed with no complaints or questions today   Medications:  Current Facility-Administered Medications  Medication Dose Route Frequency Provider Last Rate Last Admin   allopurinol (ZYLOPRIM) tablet 100 mg  100 mg Oral QHS Wynetta Fines T, MD   100 mg at 08/26/21 2052   apixaban (ELIQUIS) tablet 5 mg  5 mg Oral BID Darliss Cheney, MD   5 mg at 08/26/21 2053   aspirin chewable tablet 81 mg  81 mg Oral Daily Wynetta Fines T, MD   81 mg at 08/26/21 0859   atorvastatin (LIPITOR) tablet 40 mg  40 mg Oral QHS Wynetta Fines T, MD   40 mg at 08/26/21 2052   Darbepoetin Alfa (ARANESP) injection 100 mcg  100 mcg Intravenous Q Mon-HD Kris Mouton, RPH   100 mcg at 08/23/21 1349   doxercalciferol (HECTOROL) injection 4 mcg  4 mcg Intravenous Q M,W,F-HD Roney Jaffe, MD   4 mcg at 08/27/21 0940   ferric gluconate (FERRLECIT) 250 mg in sodium chloride 0.9 % 250 mL IVPB  250 mg Intravenous Daily Reesa Chew, MD       folic acid (FOLVITE) tablet 1 mg  1  mg Oral Daily Wynetta Fines T, MD   1 mg at 08/26/21 0859   heparin sodium (porcine) injection 1,200 Units  1,200 Units Intravenous PRN Reesa Chew, MD   1,200 Units at 08/27/21 0743   HYDROcodone-acetaminophen (NORCO/VICODIN) 5-325 MG per tablet 1 tablet  1 tablet Oral BID PRN Wynetta Fines T, MD   1 tablet at 08/25/21 0940   insulin aspart (novoLOG) injection 0-6 Units  0-6 Units Subcutaneous TID WC Lequita Halt, MD   1 Units at 08/26/21 1825   levalbuterol (XOPENEX) nebulizer solution 0.63 mg  0.63 mg Inhalation Q8H PRN Darliss Cheney, MD       lidocaine-prilocaine (EMLA) cream 1 application  1 application Topical PRN Florencia Reasons, MD       melatonin tablet 5 mg  5 mg Oral QHS PRN Wynetta Fines T, MD   5 mg at 08/19/21 2210   multivitamin (RENA-VIT) tablet 1 tablet  1 tablet Oral QHS Florencia Reasons, MD   1 tablet at 08/26/21 2052   ondansetron (ZOFRAN) tablet 4 mg  4 mg Oral Q6H PRN Lequita Halt, MD       Or   ondansetron [facility]) injection 4 mg  4 mg Intravenous Q6H PRN Lequita Halt, MD       polyethylene glycol [facility] /  GLYCOLAX) packet 17 g  17 g Oral Daily PRN Wynetta Fines T, MD   17 g at 08/21/21 2147   predniSONE (DELTASONE) tablet 5 mg  5 mg Oral Q breakfast Florencia Reasons, MD       senna-docusate (Senokot-S) tablet 1 tablet  1 tablet Oral BID Florencia Reasons, MD   1 tablet at 08/26/21 2053   sevelamer carbonate (RENVELA) tablet 800 mg  800 mg Oral TID WC Wynetta Fines T, MD   800 mg at 08/26/21 1825      ROS: Unable to obtain due to the patient's nonverbal status  Physical Exam: Vitals:   08/27/21 0930 08/27/21 1000  BP: 126/68 123/65  Pulse: 83 60  Resp: 19 17  Temp:    SpO2:     No intake/output data recorded.  Intake/Output Summary (Last 24 hours) at 08/27/2021 1029 Last data filed at 08/27/2021 0428 Gross per 24 hour  Intake --  Output 2 ml  Net -2 ml   Constitutional: well-appearing, no acute distress ENMT: ears and nose without scars or lesions, MMM CV: normal rate, no  edema Respiratory: Bilateral chest rise, normal work of breathing Gastrointestinal: soft, non-tender, no palpable masses or hernias Skin: no visible lesions or rashes Psych: awake and alert   Test Results I personally reviewed new and old clinical labs and radiology tests Lab Results  Component Value Date   NA 131 (L) 08/27/2021   K 4.2 08/27/2021   CL 95 (L) 08/27/2021   CO2 26 08/27/2021   BUN 55 (H) 08/27/2021   CREATININE 5.70 (H) 08/27/2021   CALCIUM 8.7 (L) 08/27/2021   ALBUMIN 2.4 (L) 08/27/2021   PHOS 3.6 08/27/2021

## 2021-08-27 NOTE — Progress Notes (Signed)
PROGRESS NOTE    Jonathan [name]  OTL:[id number] DOB: 1951-08-05 DOA: 08/19/2021 PCP: Ginger Organ., MD    No chief complaint on file.   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  Subjective:  Medically stable to discharge to SNF No acute interval changes He is seen at HD unit, denies acute complaints   Assessment & Plan:   Principal Problem:   S/P AKA (above knee amputation) unilateral, left ([name]) Active Problems:   PVD (peripheral vascular disease) ([name])   Protein-calorie malnutrition, severe   Status post left AKA for left leg nonhealing wound -Wound/VAC  removed -care per vascular surgery.    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  Hemodialysis as per nephrology schedule -Currently still on oral prednisone.   Diabetes mellitus type 2 with hyperglycemia, does not appear to be on insulin at home Was on insulin in the past but discontinued due to hypoglycemia -A1c 4.9, not sure is reliable in the setting of ESRD and anemia - Continue CBGs with SSI  Hyperlipidemia -Continue statin     H/o LV thrombosis  -Eliquis has been already resumed   History of unspecified stroke with residual aphasia/vascular dementia -not oriented to time, but to place and person, he is calm and cooperative -Continue aspirin, statin and Eliquis     underweight: Body mass index is 15.63 kg/m.Marland Kitchen Nutrition consult placed  Unresulted Labs (From admission, onward)    None         DVT prophylaxis:  apixaban (ELIQUIS) tablet 5 mg   Code Status:full Family  Communication: wife at bedside on 1/17,1/18,1/19 Disposition:   Status is: Inpatient  Dispo: The patient is from: home              Anticipated d/c is to: SNF              Anticipated d/c date is: medically stable to SNF                Consultants:  Vascular surgery nephrology  Procedures:  Left aka on 1/12  Antimicrobials:    Anti-infectives (From admission, onward)    Start     Dose/Rate Route Frequency Ordered Stop   08/19/21 0856  ceFAZolin (ANCEF) IVPB 2g/100 mL premix        2 g 200 mL/hr over 30 Minutes Intravenous 30 min pre-op 08/19/21 0856 08/19/21 1137          Objective: Vitals:   08/27/21 1100 08/27/21 1116 08/27/21 1123 08/27/21 1142  BP: 127/66 127/86 126/69 126/62  Pulse: 78 72 83 79  Resp: 18 20 20 19   Temp:   97.7 F (36.5 C) 97.7 F (36.5 C)  TempSrc:   Oral Oral  SpO2:   98% 97%  Weight:   48 kg   Height:        Intake/Output Summary (Last 24 hours) at 08/27/2021 1224 Last data filed at 08/27/2021 1143 Gross per 24 hour  Intake 0 ml  Output 502 ml  Net -502 ml   Filed Weights   08/25/21 1304 08/27/21 0722 08/27/21 1123  Weight: 49.6 kg 48.7 kg 48  kg    Examination:  General exam: alert, awake, communicative,calm, NAD Respiratory system: Clear to auscultation. Respiratory effort normal. Cardiovascular system:  RRR.  Gastrointestinal system: Abdomen is nondistended, soft and nontender.  Normal bowel sounds heard. Central nervous system: Alert and orientedx2. No focal neurological deficits. Extremities:  right BKA, left AKA with wound vac Skin: No rashes, lesions or ulcers Psychiatry: calm and cooperative .     Data Reviewed: I have personally reviewed following labs and imaging studies  CBC: Recent Labs  Lab 08/26/21 0219 08/27/21 0621  WBC 7.0 6.8  HGB 8.4* 8.2*  HCT 27.2* 25.1*  MCV 89.8 90.9  PLT 225 347    Basic Metabolic Panel: Recent Labs  Lab 08/23/21 0151 08/23/21 1002 08/23/21 1920 08/27/21 0700  NA  130*  --  134* 131*  K 5.6*  --  4.7 4.2  CL 94*  --  94* 95*  CO2 26  --  27 26  GLUCOSE 145*  --  243* 117*  BUN 65*  --  43* 55*  CREATININE 7.93*  --  5.58* 5.70*  CALCIUM 8.5*  --  8.1* 8.7*  MG  --   --  2.1  --   PHOS  --  5.4*  --  3.6    GFR: Estimated Creatinine Clearance: 8.4 mL/min (A) (by C-G formula based on SCr of 5.7 mg/dL (H)).  Liver Function Tests: Recent Labs  Lab 08/23/21 0151 08/27/21 0700  AST 13*  --   ALT <5  --   ALKPHOS 46  --   BILITOT 0.3  --   PROT 5.6*  --   ALBUMIN 2.4* 2.4*    CBG: Recent Labs  Lab 08/26/21 1510 08/26/21 1821 08/26/21 2100 08/27/21 0608 08/27/21 1145  GLUCAP 241* 213* 188* 144* 87     No results found for this or any previous visit (from the past 240 hour(s)).        Radiology Studies: No results found.      Scheduled Meds:  allopurinol  100 mg Oral QHS   apixaban  5 mg Oral BID   aspirin  81 mg Oral Daily   atorvastatin  40 mg Oral QHS   darbepoetin (ARANESP) injection - DIALYSIS  100 mcg Intravenous Q Mon-HD   doxercalciferol  4 mcg Intravenous Q M,W,F-HD   folic acid  1 mg Oral Daily   insulin aspart  0-6 Units Subcutaneous TID WC   multivitamin  1 tablet Oral QHS   predniSONE  5 mg Oral Q breakfast   senna-docusate  1 tablet Oral BID   sevelamer carbonate  800 mg Oral TID WC   Continuous Infusions:  ferric gluconate (FERRLECIT) IVPB       LOS: 8 days   Time spent: 80mins Greater than 50% of this time was spent in counseling, explanation of diagnosis, planning of further management, and coordination of care.   Voice Recognition Viviann Spare dictation system was used to create this note, attempts have been made to correct errors. Please contact the author with questions and/or clarifications.   Florencia Reasons, MD PhD FACP [facility]  Available via Epic secure chat 7am-7pm for nonurgent issues Please page for urgent issues To page the attending provider between 7A-7P or the covering  provider during after hours 7P-7A, please log into the web site [email or web address] and access using universal [place] password for that web site. If you do not have the password, please call the hospital operator.    08/27/2021, 12:24  PM

## 2021-08-27 NOTE — TOC Progression Note (Signed)
Transition of Care (TOC) - Progression Note    Patient Details  Name: Jonathan SPISAK Sr. MRN: [id number] Date of Birth: [birth year]  Transition of Care [facility]) CM/SW [facility], [place] Phone Number: 08/27/2021, 3:13 PM  Clinical Narrative:     CSW spoke with patient's spouse,Jonathan [name]- CSW introduced self and explained role. She confirmed SNF choice of [facility]. Patient has been there before. She states patient uses [facility]. She states she will call [facility] to arrange his transport all treatments next week. CSW advised per SNF she must be responsible for arranging transportation. She states understanding and remains agreeable to placement.   [facility] confirmed bed offer and availability. SNF has started authorization- remains pending-if Josem Kaufmann is received , anticipate patient d/c tomorrow  Informed Renal Navigator/Tracy-of anticipated d/c plan.   Thurmond Butts, MSW, LCSW Clinical Social Worker     Expected Discharge Plan: Skilled Nursing Facility Barriers to Discharge: Insurance Authorization  Expected Discharge Plan and Services Expected Discharge Plan: [place] In-house Referral: Clinical Social Work                                             Social Determinants of Health (SDOH) Interventions    Readmission Risk Interventions Readmission Risk Prevention Plan 08/10/2020  Transportation Screening Complete  Medication Review Press photographer) Complete  PCP or Specialist appointment within 3-5 days of discharge Complete  HRI or [place] Complete  SW Recovery Care/Counseling Consult Complete  [place] Not Applicable  Some recent data might be hidden

## 2021-08-28 LAB — GLUCOSE, CAPILLARY
Glucose-Capillary: 126 mg/dL — ABNORMAL HIGH (ref 70–99)
Glucose-Capillary: 152 mg/dL — ABNORMAL HIGH (ref 70–99)
Glucose-Capillary: 146 mg/dL — ABNORMAL HIGH (ref 70–99)
Glucose-Capillary: 222 mg/dL — ABNORMAL HIGH (ref 70–99)

## 2021-08-28 LAB — SARS CORONAVIRUS 2 (TAT 6-24 HRS): SARS Coronavirus 2: NEGATIVE

## 2021-08-28 MED ORDER — INSULIN ASPART 100 UNIT/ML IJ SOLN: INTRAMUSCULAR | 0 refills | Status: AC

## 2021-08-28 MED ORDER — ACETAMINOPHEN 325 MG PO TABS: 650.0000 mg | ORAL_TABLET | Freq: Four times a day (QID) | ORAL | 0 refills | Status: AC | PRN

## 2021-08-28 NOTE — Progress Notes (Signed)
Nephrology Follow-Up Consult note   Assessment/Recommendations: Jonathan MONTANARI Sr. is a/an 71 y.o. male with a past medical history significant for ESRD, admitted for AKA on 1/12.        OP HD: MWF [facility]  4h 53min  55kg  2/2 bath  Hep 3000  LU AVF   - hect 4 ug tiw   - mircera 150 q2, last 1/02 (due 1/16)  ESRD: Continue MWF schedule  Status post AKA 1/12: Per [facility]  HTN/Volume: below EDW, new edw ~ 48- 49kg. LBW loss. BP good. Adjust Uf PRN  Anemia of CKD: Hgb 8.2. Iron 250mg  x2 doses continue ESA 151mcg weekly.  H/o failed renal transplant: continue prednisone daily  Secondary Hyperparathyroidism: continue hecterol and binder. Phos and calcium acceptable  DM2: mgmt per primary  H/o CVA: aphasia and dementia  Dispo: OK for dc today to SNF.  [facility] contacted [facility] by our SW, next OP HD on Monday.      Recommendations conveyed to primary service.    [facility] 08/28/2021 1:27 PM  ___________________________________________________________  CC: ESRD  Interval History/Subjective: Patient lying in bed with no complaints or questions today   Medications:  Current Facility-Administered Medications  Medication Dose Route Frequency Provider Last Rate Last Admin   allopurinol (ZYLOPRIM) tablet 100 mg  100 mg Oral QHS Wynetta Fines T, MD   100 mg at 08/27/21 2132   apixaban (ELIQUIS) tablet 5 mg  5 mg Oral BID Darliss Cheney, MD   5 mg at 08/28/21 5597   aspirin chewable tablet 81 mg  81 mg Oral Daily Wynetta Fines T, MD   81 mg at 08/28/21 4163   atorvastatin (LIPITOR) tablet 40 mg  40 mg Oral QHS Wynetta Fines T, MD   40 mg at 08/27/21 2132   Darbepoetin Alfa (ARANESP) injection 100 mcg  100 mcg Intravenous Q Mon-HD Kris Mouton, RPH   100 mcg at 08/23/21 1349   doxercalciferol (HECTOROL) injection 4 mcg  4 mcg Intravenous Q M,W,F-HD Roney Jaffe, MD   4 mcg at 84/53/64 6803   folic acid (FOLVITE) tablet 1 mg  1 mg Oral Daily Wynetta Fines T, MD    1 mg at 08/28/21 2122   heparin sodium (porcine) injection 1,200 Units  1,200 Units Intravenous PRN Reesa Chew, MD   1,200 Units at 08/27/21 0743   HYDROcodone-acetaminophen (NORCO/VICODIN) 5-325 MG per tablet 1 tablet  1 tablet Oral BID PRN Wynetta Fines T, MD   1 tablet at 08/25/21 0940   insulin aspart (novoLOG) injection 0-6 Units  0-6 Units Subcutaneous TID WC Wynetta Fines T, MD   1 Units at 08/28/21 1217   levalbuterol (XOPENEX) nebulizer solution 0.63 mg  0.63 mg Inhalation Q8H PRN Darliss Cheney, MD       lidocaine-prilocaine (EMLA) cream 1 application  1 application Topical PRN Florencia Reasons, MD       melatonin tablet 5 mg  5 mg Oral QHS PRN Wynetta Fines T, MD   5 mg at 08/19/21 2210   multivitamin (RENA-VIT) tablet 1 tablet  1 tablet Oral QHS Florencia Reasons, MD   1 tablet at 08/27/21 2132   ondansetron (ZOFRAN) tablet 4 mg  4 mg Oral Q6H PRN Lequita Halt, MD       Or   ondansetron [facility]) injection 4 mg  4 mg Intravenous Q6H PRN Lequita Halt, MD       polyethylene glycol (MIRALAX / GLYCOLAX) packet 17 g  17 g Oral Daily PRN Wynetta Fines T, MD   17 g at 08/21/21 2147   predniSONE (DELTASONE) tablet 5 mg  5 mg Oral Q breakfast Florencia Reasons, MD   5 mg at 08/28/21 7654   senna-docusate (Senokot-S) tablet 1 tablet  1 tablet Oral BID Florencia Reasons, MD   1 tablet at 08/27/21 1224   sevelamer carbonate (RENVELA) tablet 800 mg  800 mg Oral TID WC Wynetta Fines T, MD   800 mg at 08/28/21 1217      ROS: Unable to obtain due to the patient's nonverbal status  Physical Exam: Vitals:   08/28/21 0804 08/28/21 1131  BP: 130/73 119/88  Pulse: 80 76  Resp: 15 20  Temp: 99.3 F (37.4 C) 97.8 F (36.6 C)  SpO2: 98% 98%   No intake/output data recorded.  Intake/Output Summary (Last 24 hours) at 08/28/2021 1327 Last data filed at 08/27/2021 1615 Gross per 24 hour  Intake 0 ml  Output --  Net 0 ml    Constitutional: well-appearing, no acute distress ENMT: ears and nose without scars or lesions, MMM CV:  normal rate, no edema Respiratory: Bilateral chest rise, normal work of breathing Gastrointestinal: soft, non-tender, no palpable masses or hernias Skin: no visible lesions or rashes Psych: awake and alert   Test Results I personally reviewed new and old clinical labs and radiology tests Lab Results  Component Value Date   NA 131 (L) 08/27/2021   K 4.2 08/27/2021   CL 95 (L) 08/27/2021   CO2 26 08/27/2021   BUN 55 (H) 08/27/2021   CREATININE 5.70 (H) 08/27/2021   CALCIUM 8.7 (L) 08/27/2021   ALBUMIN 2.4 (L) 08/27/2021   PHOS 3.6 08/27/2021

## 2021-08-28 NOTE — Progress Notes (Signed)
Mobility Specialist: Progress Note   08/28/21 1413  Mobility  Activity Stood at bedside  Level of Assistance +2 (takes two people)  Counsellor  Activity Response Tolerated well  $Mobility charge 1 Mobility   Pre-Mobility: 96 HR Post-Mobility: 97 HR, BP, SpO2  Pt required minA to scoot towards EOB as well as physical assistance donning prosthesis. Tolerated standing in the [place] well today. Able to stand x2 and assisted with pericare. Pt back to bed with bed alarm on and family present in the room.   [facility] Julina Altmann Mobility Specialist Mobility Specialist 4 [place]: [phone] Mobility Specialist 2 [place] and [place]: [phone]

## 2021-08-28 NOTE — Progress Notes (Signed)
Received report from Nicanor Bake, Patient already discharged, all discharge paperwork in the chart, Patient awaiting for [facility]AR for pick up. Wife at the bedside. Night time will be given before pick up to prevent missing dose during transition. We'll continue to monitor.

## 2021-08-28 NOTE — Discharge Summary (Addendum)
Discharge Summary  Jonathan [name] OZD:[id number] DOB: [birth year]  PCP: Ginger Organ., MD  Admit date: 08/19/2021 Discharge date: 08/28/2021  Time spent: 9mins, more than 50% time spent on coordination of care.   Recommendations for Outpatient Follow-up:  F/u with PCP within a week  for hospital discharge follow up, repeat cbc/bmp at follow up F/u with nephrology, continue HD MWF F/u with vascular surgery as scheduled , wound care per vascular surgery Recommend palliative care follow patient at SNF     Discharge Diagnoses:  Active Hospital Problems   Diagnosis Date Noted   S/P AKA (above knee amputation) unilateral, left ([place]) 08/19/2021   Protein-calorie malnutrition, severe 08/26/2021   PVD (peripheral vascular disease) ([place]) 08/19/2021    Resolved Hospital Problems  No resolved problems to display.    Discharge Condition: stable  Diet recommendation: renal diet /carb modified  Filed Weights   08/27/21 2595 08/27/21 1123 08/28/21 0413  Weight: 48.7 kg 48 kg 51.3 kg    History of present illness: ( per admitting MD Dr Roosevelt Locks)  HPI: Jonathan [name]. is a 71 y.o. 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   Patient has a chronic left heel ulcer, and recently broke his knee and then mobility significantly deteriorated.  Vascular surgery offered left leg above-knee amputation.  Patient underwent left AKA today, tolerated procedure well, pain is controlled.  Received about 200-300 mL fluid during procedure, no complaint of shortness of breath or chest pains. Hospital Course:  Principal Problem:   S/P AKA (above knee amputation) unilateral, left ([name]) Active Problems:   PVD (peripheral  vascular disease) ([name])   Protein-calorie malnutrition, severe   Status post left AKA for left leg nonhealing wound -f/u with vascular on 2/14 for staple removal , wound care per  vascular surgery.     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  Hemodialysis as per nephrology schedule -Currently still on oral prednisone.    Diabetes mellitus type 2 with hyperglycemia, does not appear to be on insulin at home -Was on insulin in the past but discontinued due to hypoglycemia -A1c 4.9, not sure is reliable in the setting of ESRD and anemia - Continue CBGs with SSI  Hyperlipidemia -Continue statin     H/o LV thrombosis  -continue Eliquis    History of unspecified stroke with residual aphasia/vascular dementia -not oriented to time, but to place and person, he does not talk much at all, he is calm and cooperative -Continue aspirin, statin and Eliquis      FTT: underweight: Body mass index is 15.63 kg/m.Marland Kitchen Nutrition Status: Nutrition Problem: Severe Malnutrition Etiology: chronic illness (ESRD, CHF, dementia) Signs/Symptoms: severe muscle depletion, severe fat depletion Interventions: Hormel Shake, MVI, Liberalize Diet, Other (Comment) (double protein portions)   Consultations: Vascular surgery Nephrology   Discharge Exam: BP 119/88 (BP Location: Left Arm)    Pulse 76    Temp 97.8 F (36.6 C) (Oral)    Resp 20    Ht 5\' 9"  (1.753 m)    Wt 51.3 kg    SpO2 98%    BMI 16.70 kg/m  General exam: alert, awake, does not talk much but oriented to place and person  Respiratory system: Clear to auscultation. Respiratory effort normal. Cardiovascular system:  RRR.  Gastrointestinal system: Abdomen is nondistended, soft and nontender.  Normal bowel sounds heard. Central nervous system: Alert and orientedx2. No focal neurological  deficits. Extremities:  right BKA, left AKA  Skin: No rashes, lesions or ulcers Psychiatry: calm and cooperative .   Discharge Instructions You were cared for by a hospitalist during your hospital stay. If you have any questions about your discharge medications or the care you received while you were in the hospital after you are discharged, you can call the unit and asked to speak with the hospitalist on call if the hospitalist that took care of you is not available. Once you are discharged, your primary care physician will handle any further medical issues. Please note that NO REFILLS for any discharge medications will be authorized once you are discharged, as it is imperative that you return to your primary care physician (or establish a relationship with a primary care physician if you do not have one) for your aftercare needs so that they can reassess your need for medications and monitor your lab values.  Discharge Instructions     Amb Referral to Palliative Care   Complete by: As directed    Diet - low sodium heart healthy   Complete by: As directed    Diet general   Complete by: As directed    Renal diet/ carb modified   Discharge wound care:   Complete by: As directed    Left stump care per vascular surgery   Increase activity slowly   Complete by: As directed       Allergies as of 08/28/2021   No Known Allergies      Medication List     STOP taking these medications    diphenoxylate-atropine 2.5-0.025 MG tablet Commonly known as: LOMOTIL   HYDROcodone-acetaminophen 5-325 MG tablet Commonly known as: NORCO/VICODIN       TAKE these medications    acetaminophen 325 MG tablet Commonly known as: Tylenol Take 2 tablets (650 mg total) by mouth every 6 (six) hours as needed for moderate pain.   acidophilus Caps capsule Take 2 capsules by mouth 3 (three) times daily.   allopurinol 100 MG tablet Commonly known as: ZYLOPRIM Take 100 mg by mouth at bedtime.    apixaban 5 MG Tabs tablet Commonly known as: ELIQUIS Take 1 tablet (5 mg total) by mouth 2 (two) times daily.   aspirin 81 MG chewable tablet Chew 81 mg by mouth daily.   atorvastatin 40 MG tablet Commonly known as: LIPITOR Take 40 mg by mouth at bedtime.   b complex-vitamin c-folic acid 0.8 MG Tabs tablet Take 1 tablet by mouth daily.   feeding supplement Liqd Take 1 Container by mouth daily as needed (nutrition).   FIBER CHOICE PO Take 1 tablet by mouth daily.   folic acid 1 MG tablet Commonly known as: FOLVITE Take 1 tablet (1 mg total) by mouth daily.   Gerhardt's butt cream Crea Apply 1 application topically 4 (four) times daily. Mix desitin with antifugal cream and apply a layer to buttocks/scrotum.   hydrocortisone 25 MG suppository Commonly known as: ANUSOL-HC Place 1 suppository (25 mg total) rectally 2 (two) times daily.   insulin aspart 100 UNIT/ML injection Commonly known as: novoLOG Insulin sliding scale: Blood sugar  120-150   3units  151-200   4units                       201-250   7units                       251- 300  11units                       301-350   15uints                       351-400   20units                       >400         call MD immediately What changed:  how much to take how to take this when to take this reasons to take this additional instructions   Insulin Pen Needle 32G X 4 MM Misc 1 each by Other route as needed (insulin).   levalbuterol 45 MCG/ACT inhaler Commonly known as: XOPENEX HFA Inhale 2 puffs into the lungs every 8 (eight) hours as needed for wheezing.   lidocaine-prilocaine cream Commonly known as: EMLA Apply 1 application topically 3 (three) times a week. 30 minutes prior to Dialysis - MWF   melatonin 5 MG Tabs Take 1 tablet (5 mg total) by mouth at bedtime. What changed:  when to take this reasons to take this   nystatin cream Commonly known as: MYCOSTATIN Apply topically 2  (two) times daily.   nystatin powder Commonly known as: MYCOSTATIN/NYSTOP Apply topically 3 (three) times daily.   polyethylene glycol 17 g packet Commonly known as: MIRALAX / GLYCOLAX Take 17 g by mouth daily as needed for mild constipation.   predniSONE 5 MG tablet Commonly known as: DELTASONE Take 5 mg by mouth daily with breakfast.   sevelamer carbonate 800 MG tablet Commonly known as: RENVELA Take 1 tablet (800 mg total) by mouth 3 (three) times daily with meals.   witch hazel-glycerin pad Commonly known as: TUCKS Apply topically as needed for itching.               Discharge Care Instructions  (From admission, onward)           Start     Ordered   08/28/21 0000  Discharge wound care:       Comments: Left stump care per vascular surgery   08/28/21 1103           No Known Allergies  Follow-up Information     [facility] Follow up in 4 week(s).   Specialty: Vascular Surgery Why: Office will call you to arrange your appt (sent) Contact information: [place] [phone]        Ginger Organ., MD Follow up.   Specialty: Internal Medicine Contact information: [place] [address] [phone]                  The results of significant diagnostics from this hospitalization (including imaging, microbiology, ancillary and laboratory) are listed below for reference.    Significant Diagnostic Studies: DG Chest 1 View  Result Date: 08/19/2021 CLINICAL DATA:  Shortness of breath, status post above knee amputation in the left lower extremity EXAM: CHEST  1 VIEW COMPARISON:  06/03/2021 FINDINGS: Transverse diameter of heart is increased. Central pulmonary vessels are more prominent.  Increased interstitial and alveolar densities seen in both lungs, more so on the right side. There is blunting of right lateral CP angle. There is no pneumothorax.  Vascular stents are noted in the right brachial and right axillary vessels. IMPRESSION: Cardiomegaly. There is interval worsening of pulmonary vascular congestion suggesting CHF. Increased interstitial and alveolar densities seen in both lungs, more so on the right side suggesting asymmetric pulmonary edema. Possibility of underlying pneumonia is not excluded. Electronically Signed   By: Elmer Picker M.D.   On: 08/19/2021 13:06    Microbiology: Recent Results (from the past 240 hour(s))  SARS CORONAVIRUS 2 (TAT 6-24 HRS) Nasopharyngeal Nasopharyngeal Swab     Status: None   Collection Time: 08/28/21 12:05 AM   Specimen: Nasopharyngeal Swab  Result Value Ref Range Status   SARS Coronavirus 2 NEGATIVE NEGATIVE Final    Comment: (NOTE) SARS-CoV-2 target nucleic acids are NOT DETECTED.  The SARS-CoV-2 RNA is generally detectable in upper and lower respiratory specimens during the acute phase of infection. Negative results do not preclude SARS-CoV-2 infection, do not rule out co-infections with other pathogens, and should not be used as the sole basis for treatment or other patient management decisions. Negative results must be combined with clinical observations, patient history, and epidemiological information. The expected result is Negative.  Fact Sheet for Patients: SugarRoll.be  Fact Sheet for Healthcare Providers: https://www.woods-mathews.com/  This test is not yet approved or cleared by the [place] FDA and  has been authorized for detection and/or diagnosis of SARS-CoV-2 by FDA under an Emergency Use Authorization (EUA). This EUA will remain  in effect (meaning this test can be used) for the duration of the COVID-19 declaration under Se ction 564(b)(1) of the Act, 21 U.S.C. section 360bbb-3(b)(1), unless the authorization is terminated or revoked sooner.  Performed at [facility], [place] [address] [place],  [place] [address]      Labs: Basic Metabolic Panel: Recent Labs  Lab 08/23/21 0151 08/23/21 1002 08/23/21 1920 08/27/21 0700  NA 130*  --  134* 131*  K 5.6*  --  4.7 4.2  CL 94*  --  94* 95*  CO2 26  --  27 26  GLUCOSE 145*  --  243* 117*  BUN 65*  --  43* 55*  CREATININE 7.93*  --  5.58* 5.70*  CALCIUM 8.5*  --  8.1* 8.7*  MG  --   --  2.1  --   PHOS  --  5.4*  --  3.6   Liver Function Tests: Recent Labs  Lab 08/23/21 0151 08/27/21 0700  AST 13*  --   ALT <5  --   ALKPHOS 46  --   BILITOT 0.3  --   PROT 5.6*  --   ALBUMIN 2.4* 2.4*   No results for input(s): LIPASE, AMYLASE in the last 168 hours. No results for input(s): AMMONIA in the last 168 hours. CBC: Recent Labs  Lab 08/26/21 0219 08/27/21 0621  WBC 7.0 6.8  HGB 8.4* 8.2*  HCT 27.2* 25.1*  MCV 89.8 90.9  PLT 225 239   Cardiac Enzymes: No results for input(s): CKTOTAL, CKMB, CKMBINDEX, TROPONINI in the last 168 hours. BNP: BNP (last 3 results) Recent Labs    06/04/21 0506  BNP 2,665.2*    ProBNP (last 3 results) No results for input(s): PROBNP in the last 8760 hours.  CBG: Recent Labs  Lab 08/27/21 1145 08/27/21 1616 08/27/21 2115 08/28/21 0615 08/28/21 1143  GLUCAP 87 151*  133* 126* 152*       Signed:  Florencia Reasons MD, PhD, FACP  [facility] 08/28/2021, 2:16 PM

## 2021-08-28 NOTE — TOC Progression Note (Addendum)
Transition of Care (TOC) - Progression Note    Patient Details  Name: Jonathan LABELL Sr. MRN: [id number] Date of Birth: 1950/09/14  Transition of Care [facility]) CM/SW [place], LCSW Phone Number:[phone] 08/28/2021, 9:28 AM  Clinical Narrative:     CSW attempted to call Juliann Pulse at [facility] to follow up on authorization. CSW had to leave a message.  10:03_ received a return call from Kersey at [facility] and pt's authorization is back. MD alerted. TOC team will continue to assist with discharge planning needs.    Expected Discharge Plan: Skilled Nursing Facility Barriers to Discharge: Insurance Authorization  Expected Discharge Plan and Services Expected Discharge Plan: [place] In-house Referral: Clinical Social Work                                             Social Determinants of Health (SDOH) Interventions    Readmission Risk Interventions Readmission Risk Prevention Plan 08/10/2020  Transportation Screening Complete  Medication Review Press photographer) Complete  PCP or Specialist appointment within 3-5 days of discharge Complete  HRI or [place] Complete  SW Recovery Care/Counseling Consult Complete  [place] Not Applicable  Some recent data might be hidden

## 2021-08-28 NOTE — Progress Notes (Signed)
Order received to discharge patient.  Telemetry monitor removed and CCMD notified.  PIV access x1 removed.  Report called to receiving RN at [employer]. Awaiting PT[facility] arrival.

## 2021-08-28 NOTE — TOC Transition Note (Signed)
Transition of Care [facility]) - CM/SW Discharge Note   Patient Details  Name: Jonathan TROIA Sr. MRN: [id number] Date of Birth: [birth year]  Transition of Care [facility]) CM/SW Contact:  Bary Castilla, LCSW Phone Number: [phone] 08/28/2021, 2:48 PM   Clinical Narrative:     Patient will DC to:?[facility] date:?08/28/2021 Family notified:?Wife Ova [place]water by: Corey Harold   Per MD patient ready for DC to [facility] RN, patient, patient's family, and facility notified of DC. Discharge Summary sent to facility. RN given number for report  [phone]. DC packet on chart. Ambulance transport requested for patient.  CSW signing off.   [place], [place] [phone]   Final next level of care: Skilled Nursing Facility Barriers to Discharge: Barriers Resolved   Patient Goals and CMS Choice        Discharge Placement              Patient chooses bed at: [facility] Patient to be transferred to facility by: [facility] Name of family member notified: [name] spouse Patient and family notified of of transfer: 08/28/21  Discharge Plan and Services In-house Referral: Clinical Social Work                                   Social Determinants of Health (SDOH) Interventions     Readmission Risk Interventions Readmission Risk Prevention Plan 08/10/2020  Transportation Screening Complete  Medication Review Press photographer) Complete  PCP or Specialist appointment within 3-5 days of discharge Complete  HRI or [place] Complete  SW Recovery Care/Counseling Consult Complete  [place] Not Applicable  Some recent data might be hidden

## 2021-08-31 ENCOUNTER — Encounter: Payer: Medicare Other | Admitting: Physical Medicine and Rehabilitation

## 2021-09-01 ENCOUNTER — Other Ambulatory Visit: Payer: Self-pay

## 2021-09-01 ENCOUNTER — Emergency Department (HOSPITAL_COMMUNITY): Payer: Medicare Other

## 2021-09-01 ENCOUNTER — Emergency Department (HOSPITAL_COMMUNITY)
Admission: EM | Admit: 2021-09-01 | Discharge: 2021-09-02 | Disposition: A | Discharge status: Skilled Nursing Facility | Payer: Medicare Other | Attending: Emergency Medicine | Admitting: Emergency Medicine

## 2021-09-01 ENCOUNTER — Encounter (HOSPITAL_COMMUNITY): Payer: Self-pay | Admitting: Emergency Medicine

## 2021-09-01 DIAGNOSIS — Z7901 Long term (current) use of anticoagulants: Secondary | ICD-10-CM | POA: Diagnosis not present

## 2021-09-01 DIAGNOSIS — E1122 Type 2 diabetes mellitus with diabetic chronic kidney disease: Secondary | ICD-10-CM | POA: Diagnosis not present

## 2021-09-01 DIAGNOSIS — Z794 Long term (current) use of insulin: Secondary | ICD-10-CM | POA: Diagnosis not present

## 2021-09-01 DIAGNOSIS — I132 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 kidney disease, or end stage renal disease: Secondary | ICD-10-CM | POA: Diagnosis not present

## 2021-09-01 DIAGNOSIS — N186 End stage renal disease: Secondary | ICD-10-CM | POA: Diagnosis not present

## 2021-09-01 DIAGNOSIS — I509 Heart failure, unspecified: Secondary | ICD-10-CM | POA: Diagnosis not present

## 2021-09-01 DIAGNOSIS — Z7982 Long term (current) use of aspirin: Secondary | ICD-10-CM | POA: Insufficient documentation

## 2021-09-01 DIAGNOSIS — Z79899 Other long term (current) drug therapy: Secondary | ICD-10-CM | POA: Insufficient documentation

## 2021-09-01 DIAGNOSIS — F0390 Unspecified dementia without behavioral disturbance: Secondary | ICD-10-CM | POA: Diagnosis not present

## 2021-09-01 DIAGNOSIS — Z992 Dependence on renal dialysis: Secondary | ICD-10-CM | POA: Insufficient documentation

## 2021-09-01 DIAGNOSIS — U071 COVID-19: Secondary | ICD-10-CM | POA: Insufficient documentation

## 2021-09-01 DIAGNOSIS — Z8673 Personal history of transient ischemic attack (TIA), and cerebral infarction without residual deficits: Secondary | ICD-10-CM | POA: Diagnosis not present

## 2021-09-01 DIAGNOSIS — R4182 Altered mental status, unspecified: Secondary | ICD-10-CM | POA: Insufficient documentation

## 2021-09-01 DIAGNOSIS — I2510 Atherosclerotic heart disease of native coronary artery without angina pectoris: Secondary | ICD-10-CM | POA: Diagnosis not present

## 2021-09-01 DIAGNOSIS — R110 Nausea: Secondary | ICD-10-CM | POA: Diagnosis present

## 2021-09-01 LAB — CBC WITH DIFFERENTIAL/PLATELET
Abs Immature Granulocytes: 0.09 10*3/uL — ABNORMAL HIGH (ref 0.00–0.07)
Basophils Absolute: 0.1 10*3/uL (ref 0.0–0.1)
Basophils Relative: 0 %
Eosinophils Absolute: 0 10*3/uL (ref 0.0–0.5)
Eosinophils Relative: 0 %
HCT: 27.7 % — ABNORMAL LOW (ref 39.0–52.0)
Hemoglobin: 8.9 g/dL — ABNORMAL LOW (ref 13.0–17.0)
Immature Granulocytes: 1 %
Lymphocytes Relative: 13 %
Lymphs Abs: 1.5 10*3/uL (ref 0.7–4.0)
MCH: 29.5 pg (ref 26.0–34.0)
MCHC: 32.1 g/dL (ref 30.0–36.0)
MCV: 91.7 fL (ref 80.0–100.0)
Monocytes Absolute: 0.9 10*3/uL (ref 0.1–1.0)
Monocytes Relative: 8 %
Neutro Abs: 9.1 10*3/uL — ABNORMAL HIGH (ref 1.7–7.7)
Neutrophils Relative %: 78 %
Platelets: 284 10*3/uL (ref 150–400)
RBC: 3.02 MIL/uL — ABNORMAL LOW (ref 4.22–5.81)
RDW: 22.5 % — ABNORMAL HIGH (ref 11.5–15.5)
WBC: 11.6 10*3/uL — ABNORMAL HIGH (ref 4.0–10.5)
nRBC: 0 % (ref 0.0–0.2)

## 2021-09-01 LAB — COMPREHENSIVE METABOLIC PANEL
ALT: 9 U/L (ref 0–44)
AST: 23 U/L (ref 15–41)
Albumin: 2.5 g/dL — ABNORMAL LOW (ref 3.5–5.0)
Alkaline Phosphatase: 57 U/L (ref 38–126)
Anion gap: 12 (ref 5–15)
BUN: 43 mg/dL — ABNORMAL HIGH (ref 8–23)
CO2: 24 mmol/L (ref 22–32)
Calcium: 8 mg/dL — ABNORMAL LOW (ref 8.9–10.3)
Chloride: 96 mmol/L — ABNORMAL LOW (ref 98–111)
Creatinine, Ser: 4.72 mg/dL — ABNORMAL HIGH (ref 0.61–1.24)
GFR, Estimated: 13 mL/min — ABNORMAL LOW (ref >60)
Glucose, Bld: 104 mg/dL — ABNORMAL HIGH (ref 70–99)
Potassium: 4.5 mmol/L (ref 3.5–5.1)
Sodium: 132 mmol/L — ABNORMAL LOW (ref 135–145)
Total Bilirubin: 0.6 mg/dL (ref 0.3–1.2)
Total Protein: 5.3 g/dL — ABNORMAL LOW (ref 6.5–8.1)

## 2021-09-01 LAB — LIPASE, BLOOD: Lipase: 24 U/L (ref 11–51)

## 2021-09-01 LAB — RESP PANEL BY RT-PCR (FLU A&B, COVID) ARPGX2
Influenza A by PCR: NEGATIVE
Influenza B by PCR: NEGATIVE
SARS Coronavirus 2 by RT PCR: POSITIVE — AB

## 2021-09-01 MED ORDER — MOLNUPIRAVIR EUA 200MG CAPSULE: 4.0000 | ORAL_CAPSULE | Freq: Two times a day (BID) | ORAL | 0 refills | Status: AC

## 2021-09-01 MED ORDER — MOLNUPIRAVIR EUA 200MG CAPSULE
4.0000 | ORAL_CAPSULE | Freq: Two times a day (BID) | ORAL | Status: DC
Start: 2021-09-01 — End: 2021-09-02
  Administered 2021-09-01: 22:00:00 800 mg via ORAL
  Filled 2021-09-01 (×2): qty 4

## 2021-09-01 MED ORDER — MOLNUPIRAVIR 200 MG PO CAPS: 4.0000 | ORAL_CAPSULE | Freq: Two times a day (BID) | ORAL | 0 refills | Status: AC

## 2021-09-01 NOTE — ED Notes (Signed)
Melissa, RN at [employer] informed that patient is positive for Covid-19 and Molnupiravir antiviral for covid first dose will be given here, then the rest of medication will be sent with transport service PTAR to bring to the SNF.

## 2021-09-01 NOTE — ED Provider Notes (Signed)
Pt signed out, here from SNF with concern for acute on chronic confusion, pt has vascular dementia, expressive aphasia, recent BKA in the hospital (discharged 2 days ago), came here from dialysis today (did not complete due to confusion).  It is unclear how he was acting differently or confused.  EDP has attempted to reach wife several times unsuccessfully.  CTH limited due to movement but no evident acute events.  Pt can answer simple questions.  Update 430 pm - patient's wife present now at bedside, reporting this is not far off patient's baseline, he is only intermittently verbal  and has significant dementia, cannot really be depended on to provide reliable history.  She feels he is more tired than usual now.  Patient's wound site appears clean, nonerythematous, nontender (left BKA).  No abdominal tenderness.  He is on chronic steroids, with minor WBC elevation today 11.6.    Single view xray did not show focal infiltrates, and he is not hypoxic  Awaiting labs to assess K, if stable anticipate likely discharge back to facility  Covid is positive - likely source of fever and confusion.  No hypoxia. Wife updated.  He is eligible for mulnupiravir therapy, will start here, prescribe for next 5 days.  Per my review of the drug information, there are no adverse interactions with his current medications, and it is safe to take on his current dialysis regimen.  His wife and the patient were consented and agreed to begin this therapy.  Otherwise I think is reasonably safe and stable for discharge back to skilled nursing facility.   Wyvonnia Dusky, MD 09/01/21 [place]

## 2021-09-01 NOTE — ED Notes (Signed)
9th on PTAR list

## 2021-09-01 NOTE — ED Provider Notes (Signed)
[facility] EMERGENCY DEPARTMENT Provider Note   CSN: [id number] Arrival date & time: 09/01/21  1219     History  No chief complaint on file.   Jonathan NOREM Sr. is a 71 y.o. male.  This is a 71 y.o. male  with significant medical history as below, including ESRD on HD MWF, renal transplant, vascular dementia, CVA who presents to the ED with complaint of AMS.  Patient is not sure why he is in the emergency room today.  Does report some nausea without emesis.  Patient reports some transient nondescript flank pain to nursing staff in triage but this was not reported at the time of my evaluation.  Denies fevers, chills, chest pain, dyspnea, rashes, falls patient is a very poor historian, history of dementia.  Per EMS patient with altered mental status adult center, not receive dialysis today but was sent to the ER for evaluation.  Baseline is unclear at this time.  Jonathan, [name] Spouse     [phone] called x3, unable to reach, voicemail was left.  Level 5 caveat, dementia   Past Medical History: No date: CAD (coronary artery disease) No date: CHF (congestive heart failure) ([name]) No date: Diabetes mellitus without complication ([name])     Comment:  type 2 No date: DM (diabetes mellitus) ([name]) No date: ESRD (end stage renal disease) ([place])     Comment:  dialysis Mon Wed Fri 09/17/2019: FUO (fever of unknown origin) No date: History of blood transfusion No date: Hyperlipidemia No date: Hypertension No date: Osteomyelitis ([place]) No date: Peripheral arterial disease ([name]) No date: PVD (peripheral vascular disease) ([place]) 2015: Renal disorder     Comment:  right kidney transplant No date: Stroke [facility]) No date: Vascular dementia ([place])  Past Surgical History: 08/05/2020: ABDOMINAL AORTOGRAM; N/A     Comment:  Procedure: ABDOMINAL AORTOGRAM;  Surgeon: [name], MD;  Location: [place] CV LAB;  Service:               Cardiovascular;   Laterality: N/A; 09/09/2020: ABDOMINAL AORTOGRAM W/LOWER EXTREMITY; N/A     Comment:  Procedure: ABDOMINAL AORTOGRAM W/LOWER EXTREMITY;                Surgeon: [name], MD;  Location: [place] CV               LAB;  Service: Cardiovascular;  Laterality: N/A; 08/06/2020: AMPUTATION; Right     Comment:  Procedure: Right Fifth Toe Ray Amputation;  Surgeon:               [name], MD;  Location: [place];  Service:               Vascular;  Laterality: Right; 11/02/2020: AMPUTATION; Right     Comment:  Procedure: RIGHT AMPUTATION BELOW KNEE;  Surgeon:               [name], MD;  Location: [place];  Service:               Vascular;  Laterality: Right; 08/19/2021: AMPUTATION; Left     Comment:  Procedure: LEFT ABOVE KNEE AMPUTATION;  Surgeon: [name], MD;  Location: [place];  Service: Vascular;                Laterality: Left;  7/42/5956: APPLICATION OF WOUND VAC; Left     Comment:  Procedure: APPLICATION OF WOUND VAC;  Surgeon: [name], MD;  Location: [place];  Service: Vascular;                Laterality: Left; No date: BACK SURGERY     Comment:  Has had 2 back surgeries 09/18/2019: BUBBLE STUDY     Comment:  Procedure: BUBBLE STUDY;  Surgeon: Buford Dresser, MD;  Location: [facility] ENDOSCOPY;  Service:               Cardiovascular;; No date: Depression 08/06/2020: ENDARTERECTOMY FEMORAL; Right     Comment:  Procedure: Right Ilio- Femoral Artery Endarterectomy;                Surgeon: [name], MD;  Location: [facility] OR;                Service: Vascular;  Laterality: Right; No date: HAND SURGERY; Right No date: HAND SURGERY 06/04/2021: HIP ARTHROPLASTY; Left     Comment:  Procedure: ARTHROPLASTY BIPOLAR HIP (HEMIARTHROPLASTY);               Surgeon: Willaim Sheng, MD;  Location: [place];                Service: Orthopedics;  Laterality: Left; 06/07/2021: IR DIALY SHUNT INTRO NEEDLE/INTRACATH  INITIAL W/IMG  RIGHT; Right 09/03/2019: IR REMOVAL TUN CV CATH W/O FL No date: KIDNEY TRANSPLANT     Comment:  November 2015 No date: LEG AMPUTATION BELOW KNEE; Right 08/05/2020: LOWER EXTREMITY ANGIOGRAPHY; Bilateral     Comment:  Procedure: LOWER EXTREMITY ANGIOGRAPHY;  Surgeon:               [name], MD;  Location: [place] CV LAB;                Service: Cardiovascular;  Laterality: Bilateral; No date: Memory loss No date: NEPHRECTOMY TRANSPLANTED ORGAN 08/06/2020: PATCH ANGIOPLASTY; Right     Comment:  Procedure: PATCH ANGIOPLASTY;  Surgeon: [name], MD;  Location: [facility] OR;  Service: Vascular;  Laterality:              Right; 09/09/2020: PERIPHERAL VASCULAR BALLOON ANGIOPLASTY; Right     Comment:  Procedure: PERIPHERAL VASCULAR BALLOON ANGIOPLASTY;                Surgeon: [name], MD;  Location: [place] CV               LAB;  Service: Cardiovascular;  Laterality: Right;                popliteal 08/05/2020: PERIPHERAL VASCULAR INTERVENTION; Right     Comment:  Procedure: PERIPHERAL VASCULAR INTERVENTION;  Surgeon:               [name], MD;  Location: [place] CV LAB;                Service: Cardiovascular;  Laterality: Right;  SFA 09/09/2020: PERIPHERAL VASCULAR INTERVENTION; Right     Comment:  Procedure: PERIPHERAL VASCULAR INTERVENTION;  Surgeon:               [name], MD;  Location: [place] CV LAB;                Service: Cardiovascular;  Laterality: Right;  external               iliac 09/18/2019: TEE WITHOUT CARDIOVERSION; N/A     Comment:  Procedure: TRANSESOPHAGEAL ECHOCARDIOGRAM (TEE);                Surgeon: Buford Dresser, MD;  Location: [facility]               ENDOSCOPY;  Service: Cardiovascular;  Laterality: N/A; 10/08/2020: WOUND DEBRIDEMENT; Right     Comment:  Procedure: DEBRIDEMENT WOUND RIGHT FOOT;  Surgeon:               [name], MD;  Location: [place];  Service:                Vascular;  Laterality: Right;    The history is provided by the EMS personnel and the patient. The history is limited by the condition of the patient. No language interpreter was used.      Home Medications Prior to Admission medications   Medication Sig Start Date End Date Taking? Authorizing Provider  acetaminophen (TYLENOL) 325 MG tablet Take 2 tablets (650 mg total) by mouth every 6 (six) hours as needed for moderate pain. 08/28/21 08/28/22  Florencia Reasons, MD  acidophilus (RISAQUAD) CAPS capsule Take 2 capsules by mouth 3 (three) times daily. Patient not taking: Reported on 08/12/2021 06/23/21   Love, Ivan Anchors, PA-C  allopurinol (ZYLOPRIM) 100 MG tablet Take 100 mg by mouth at bedtime. 06/09/14   [provider]  apixaban (ELIQUIS) 5 MG TABS tablet Take 1 tablet (5 mg total) by mouth 2 (two) times daily. 06/23/21   Love, Ivan Anchors, PA-C  aspirin 81 MG chewable tablet Chew 81 mg by mouth daily.  06/09/14   [provider]  atorvastatin (LIPITOR) 40 MG tablet Take 40 mg by mouth at bedtime.    [provider]  b complex-vitamin c-folic acid (NEPHRO-VITE) 0.8 MG TABS tablet Take 1 tablet by mouth daily. 05/08/20   [provider]  feeding supplement (BOOST HIGH PROTEIN) LIQD Take 1 Container by mouth daily as needed (nutrition).    [provider]  folic acid (FOLVITE) 1 MG tablet Take 1 tablet (1 mg total) by mouth daily. 05/11/21   Garvin Fila, MD  hydrocortisone (ANUSOL-HC) 25 MG suppository Place 1 suppository (25 mg total) rectally 2 (two) times daily. Patient not taking: Reported on 08/12/2021 06/23/21   Bary Leriche, PA-C  insulin aspart (NOVOLOG) 100 UNIT/ML injection Insulin sliding scale: Blood sugar  120-150   3units                       151-200   4units                       201-250   7units                       251- 300  11units                       301-350   15uints                       351-400   20units                       >  400          call MD immediately 08/28/21   Florencia Reasons, MD  Insulin Pen Needle 32G X 4 MM MISC 1 each by Other route as needed (insulin).  09/10/16   [provider]  Inulin (FIBER CHOICE PO) Take 1 tablet by mouth daily.    [provider]  levalbuterol Penne Lash HFA) 45 MCG/ACT inhaler Inhale 2 puffs into the lungs every 8 (eight) hours as needed for wheezing. 09/21/19   Raiford Noble Latif, DO  lidocaine-prilocaine (EMLA) cream Apply 1 application topically 3 (three) times a week. 30 minutes prior to Dialysis - MWF 05/08/18   [provider]  melatonin 5 MG TABS Take 1 tablet (5 mg total) by mouth at bedtime. Patient taking differently: Take 5 mg by mouth at bedtime as needed (sleep). 06/23/21   Love, Ivan Anchors, PA-C  Nystatin (GERHARDT'S BUTT CREAM) CREA Apply 1 application topically 4 (four) times daily. Mix desitin with antifugal cream and apply a layer to buttocks/scrotum. 06/25/21   Love, Ivan Anchors, PA-C  nystatin (MYCOSTATIN/NYSTOP) powder Apply topically 3 (three) times daily. Patient not taking: Reported on 08/12/2021 06/23/21   Love, Ivan Anchors, PA-C  nystatin cream (MYCOSTATIN) Apply topically 2 (two) times daily. Patient not taking: Reported on 08/12/2021 06/23/21   Love, Ivan Anchors, PA-C  polyethylene glycol (MIRALAX / GLYCOLAX) 17 g packet Take 17 g by mouth daily as needed for mild constipation. 06/10/21   Terrilee Croak, MD  predniSONE (DELTASONE) 5 MG tablet Take 5 mg by mouth daily with breakfast. 03/02/20   [provider]  sevelamer carbonate (RENVELA) 800 MG tablet Take 1 tablet (800 mg total) by mouth 3 (three) times daily with meals. 06/25/21   Love, Ivan Anchors, PA-C  witch hazel-glycerin (TUCKS) pad Apply topically as needed for itching. 06/25/21   Bary Leriche, PA-C      Allergies    Patient has no known allergies.    Review of Systems   Review of Systems  Unable to perform ROS: Dementia  Constitutional:  Negative for chills and fever.  Gastrointestinal:   Positive for abdominal pain and nausea. Negative for vomiting.   Physical Exam Updated Vital Signs BP (!) 130/51    Pulse (!) 109    Temp 100.2 F (37.9 C) (Oral)    Resp 17    SpO2 100%  Physical Exam Vitals and nursing note reviewed.  Constitutional:      General: He is not in acute distress.    Appearance: Normal appearance. He is well-developed. He is not ill-appearing.  HENT:     Head: Normocephalic and atraumatic. No raccoon eyes, Battle's sign, right periorbital erythema or left periorbital erythema.      Right Ear: External ear normal.     Left Ear: External ear normal.     Mouth/Throat:     Mouth: Mucous membranes are moist.  Eyes:     General: No scleral icterus.    Extraocular Movements: Extraocular movements intact.     Pupils: Pupils are equal, round, and reactive to light.  Cardiovascular:     Rate and Rhythm: Normal rate and regular rhythm.     Pulses: Normal pulses.     Heart sounds: Normal heart sounds.  Pulmonary:     Effort: Pulmonary effort is normal. No respiratory distress.     Breath sounds: Normal breath sounds.  Abdominal:     General: Abdomen is flat. There is no distension.     Palpations: Abdomen  is soft.     Tenderness: There is no abdominal tenderness.  Musculoskeletal:        General: Normal range of motion.       Arms:     Cervical back: Normal range of motion.     Right lower leg: No edema.     Left lower leg: No edema.     Comments: Evidence of AKA, prosthesis in place  Skin:    General: Skin is warm and dry.     Capillary Refill: Capillary refill takes less than 2 seconds.  Neurological:     Mental Status: He is alert and oriented to person, place, and time.     GCS: GCS eye subscore is 4. GCS verbal subscore is 5. GCS motor subscore is 6.     Cranial Nerves: No dysarthria.     Sensory: Sensation is intact.     Motor: No tremor or seizure activity.     Comments: Variable compliance with neurologic exam.  He is moving upper extremity  spontaneously, strength appears equal in both upper extremities.  Mild edema to right upper extremity likely associated with dialysis fistula.  Not tremulous, no obvious facial droop.  Psychiatric:        Mood and Affect: Mood normal.        Behavior: Behavior normal.    ED Results / Procedures / Treatments   Labs (all labs ordered are listed, but only abnormal results are displayed) Labs Reviewed  CBC WITH DIFFERENTIAL/PLATELET - Abnormal; Notable for the following components:      Result Value   WBC 11.6 (*)    RBC 3.02 (*)    Hemoglobin 8.9 (*)    HCT 27.7 (*)    RDW 22.5 (*)    Neutro Abs 9.1 (*)    Abs Immature Granulocytes 0.09 (*)    All other components within normal limits  RESP PANEL BY RT-PCR (FLU A&B, COVID) [id number]  COMPREHENSIVE METABOLIC PANEL  LIPASE, BLOOD    EKG None  Radiology CT Head Wo Contrast  Result Date: 09/01/2021 CLINICAL DATA:  Delirium, altered mental status EXAM: CT HEAD WITHOUT CONTRAST TECHNIQUE: Contiguous axial images were obtained from the base of the skull through the vertex without intravenous contrast. RADIATION DOSE REDUCTION: This exam was performed according to the departmental dose-optimization program which includes automated exposure control, adjustment of the mA and/or kV according to patient size and/or use of iterative reconstruction technique. COMPARISON:  07/31/2019 FINDINGS: Brain: Nonstandard imaging. Numerous motion artifacts. Overall nondiagnostic exam for acute intracranial process. Visible findings include generalized atrophy and small vessel chronic ischemic changes of deep cerebral white matter. No gross evidence of hemorrhage or infarct on nondiagnostic study. Vascular: Nondiagnostic Skull: Nondiagnostic due to motion artifacts. No gross abnormality seen. Sinuses/Orbits: No gross sinus opacification Other: N/A IMPRESSION: Nondiagnostic exam due to motion artifacts. Generalized atrophy and small vessel chronic ischemic changes of  deep cerebral white matter are present. No definite acute findings identified though unable to exclude additional acute intracranial abnormalities by this exam. Electronically Signed   By: Lavonia Dana M.D.   On: 09/01/2021 14:17   DG Chest Portable 1 View  Result Date: 09/01/2021 CLINICAL DATA:  Altered mental status. EXAM: PORTABLE CHEST 1 VIEW COMPARISON:  August 19, 2021. FINDINGS: Stable cardiomediastinal silhouette. Significantly decreased bilateral lung opacities noted suggesting improving pulmonary edema. Bony thorax is unremarkable. IMPRESSION: Significantly decreased bilateral lung opacities consistent with improving pulmonary edema. Electronically Signed   By: Bobbe Medico.D.  On: 09/01/2021 13:23    Procedures Procedures    Medications Ordered in ED Medications - No data to display  ED Course/ Medical Decision Making/ A&P Clinical Course as of 09/01/21 1744  Wed Sep 01, 2021  1549 D/w patient's spouse, she is full time caregiver and is on her way to hospital.  [SG]  1707 SARS Coronavirus 2 by RT PCR(!): POSITIVE [MT]    Clinical Course User Index [MT] Trifan, Carola Rhine, MD [SG] Jeanell Sparrow, DO                           Medical Decision Making Amount and/or Complexity of Data Reviewed Labs: ordered. Decision-making details documented in ED Course. Radiology: ordered.    CC: AMS, questionable nausea / abdominal pain  This patient presents to the Emergency Department for the above complaint. This involves an extensive number of treatment options and is a complaint that carries with it a high risk of complications and morbidity. Vital signs were reviewed. Serious etiologies considered.  Attempted to reach spouse, unable to reach.  Awaiting callback  Record review:  Previous records obtained and reviewed   Medical and surgical history as noted above.   Work up as above, notable for:  Labs & imaging results that were available during my care of the patient  were reviewed by me and considered in my medical decision making.   I ordered imaging studies which included CTH, CXR and I independently visualized and interpreted imaging which showed motion degraded imaging, non-diagnostic CTH. CXR was stable. Pt unable to tolerate CTAP due to positioning  Cardiac monitoring reviewed and interpreted personally which shows NSR  Social determinants of health include - dementia, nursing home pt  Management: Rpt neuro and physical exams  Reassessment:  Pt has no acute complaints currently, feels he is at his baseline, Aox3, he has no abd pain or nausea.    Pt appears to be resting comfortably, rpt abd exam is soft, non-tender, he has no acute complaints at this time, he is HDS. Awaiting on arrival of spouse per ed course above. Pt signed out to incoming EDP at this time pending remainder of labs and viral panel. If wife agreeable and labs otherwise negative would favor discharge home; if labs return abnormal would favor re-assessment and shared decision making with wife regarding dispo. He is HDS.      This chart was dictated using voice recognition software.  Despite best efforts to proofread,  errors can occur which can change the documentation meaning.         Final Clinical Impression(s) / ED Diagnoses Final diagnoses:  Dementia, unspecified dementia severity, unspecified dementia type, unspecified whether behavioral, psychotic, or mood disturbance or anxiety ([place])  Altered mental status, unspecified altered mental status type    Rx / DC Orders ED Discharge Orders     None         Jeanell Sparrow, DO 09/01/21 1748

## 2021-09-01 NOTE — Discharge Instructions (Addendum)
Ephraim was diagnosed with Covid-19 today.  Based on the symptoms and his timeline, I feel that this likely began yesterday or today.  Thankfully, he is not hypoxic, not requiring oxygen, and his blood test did not show that he needed emergency dialysis.  He can go to his next scheduled dialysis session this week normally.  I explained to his wife at the bedside that it is very common for patients like Leobardo, who have dementia, to have confusion, weakness, some lethargy.  It is extremely important that you make an effort to keep him hydrated, and to keep calories in his system.  He is eligible for therapy with antivirals.  He and his wife provided consent to start treatment. We started him on Molnupiravir today.  He will need to take this medication twice a day, as prescribed, for 5 days.  His next dose is due tomorrow morning (Jan 26th).  A prescription was sent to his pharmacy and provided printed.  This medication is considered safe with all of his current medications, and also with his dialysis, per the current available data.

## 2021-09-01 NOTE — ED Triage Notes (Addendum)
Pt arrives via [facility] from dialysis. Per [facility] pt is "acting different and confused." Pt did not receive any dialysis treatment while there. Pt is bending over in the bed. Pt reporting side pain but would not specify which side.

## 2021-09-06 ENCOUNTER — Other Ambulatory Visit: Payer: Self-pay

## 2021-09-06 ENCOUNTER — Non-Acute Institutional Stay: Payer: Medicare Other | Admitting: Hospice

## 2021-09-06 DIAGNOSIS — F0150 Vascular dementia without behavioral disturbance: Secondary | ICD-10-CM

## 2021-09-06 DIAGNOSIS — Z89511 Acquired absence of right leg below knee: Secondary | ICD-10-CM

## 2021-09-06 DIAGNOSIS — Z515 Encounter for palliative care: Secondary | ICD-10-CM

## 2021-09-06 DIAGNOSIS — R531 Weakness: Secondary | ICD-10-CM

## 2021-09-06 DIAGNOSIS — Z89612 Acquired absence of left leg above knee: Secondary | ICD-10-CM

## 2021-09-06 DIAGNOSIS — N186 End stage renal disease: Secondary | ICD-10-CM

## 2021-09-06 NOTE — Progress Notes (Signed)
PATIENT NAME: Jonathan [name] [address] [id number] [phone] (home)  DOB: January 18, 1951 MRN: [id number]  PRIMARY CARE PROVIDER:    Ginger Organ., MD,  [place] [address] [phone]  REFERRING PROVIDER:   Martinique Blattenberger, NP  RESPONSIBLE PARTY:   Self/spouse Emergency contact: Kayleen Memos [phone]  CHIEF COMPLAIN: Initial palliative care visit  Visit is to build trust and highlight Palliative Medicine as specialized medical care for people living with serious illness, aimed at facilitating better quality of life through symptoms relief, assisting with advance care plan and establishing goals of care.  Discussion on the difference between Palliative and Hospice care.  [name] is present with patient at the facility during visit; she endorsed palliative service. Visit consisted of counseling and education dealing with the complex and emotionally intense issues of symptom management and palliative care in the setting of serious and potentially life-threatening illness. Palliative care team will continue to support patient, patient's family, and medical team.  RECOMMENDATIONS/PLAN:   Advance Care Planning/CODE STATUS: Patient/spouse affirmed patient is a is a full code  GOALS OF CARE: Goals of care include to maximize quality of life and symptom management.  I spent 16 minutes providing this initial consultation. More than 50% of the time in this consultation was spent on counseling patient and coordinating communication. -------------------------------------------------------------------------------------------------------------------------------------- Symptom Management/Plan:  Weakness: Related to recent hospitalization 08/19/21 - 08/28/21, for left AKA and recent COVID-19 infection.  PT/OT is ongoing for strengthening and use of prosthesis. 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   No report of signs of infection.  Continue ongoing supportive care End-stage renal disease on hemodialysis: Continue hemodialysis as scheduled Monday, Wednesday and Friday. Vascular dementia: FAST 6D.  Impoverished thoughts, word-finding difficulty. Encourage reminiscence, word search/puzzles, cueing for recollection.  Promote calm approach and engaging environment.  Optimize ongoing supportive careContinue on going supportive care.  Palliative will continue to monitor for symptom management/decline and make recommendations as needed. Return 6 weeks or prn. Encouraged to call provider sooner with any concerns.   Family /Caregiver/Community Supports: Patient in SNF for acute rehab.   HISTORY OF PRESENT ILLNESS:  Jonathan Deckman. is a 71 y.o. 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  Weakness impairs independence and quality of life; it is worse during ADLs.  PT OT is ongoing-helpful.  Patient denies chills, pain/discomfort; no phantom pain.  History of PVD, end-stage renal disease, type 2 diabetes mellitus, stroke.  History obtained from review of EMR, discussion with primary nurse, patient/family.   Review and summarization of Epic records shows history from other than patient. Rest of 10 point ROS asked and negative.  Palliative Care was asked to follow this patient by consultation request of Ginger Organ., MD to help address complex decision making in the context of advance care planning and goals of care clarification.    HOSPICE ELIGIBILITY/DIAGNOSIS: TBD  PAST MEDICAL HISTORY:  Past Medical History:  Diagnosis Date   CAD (coronary artery disease)    CHF (congestive heart failure) ([name])    Diabetes mellitus without complication ([place])    type 2   DM (diabetes mellitus) ([place])    ESRD (end stage renal disease) ([place])    dialysis Mon Wed Fri   FUO (fever  of unknown origin) 09/17/2019   History of blood transfusion  Hyperlipidemia    Hypertension    Osteomyelitis ([place])    Peripheral arterial disease ([name])    PVD (peripheral vascular disease) ([place])    Renal disorder 2015   right kidney transplant   Stroke [facility])    Vascular dementia ([place])      SOCIAL HX: Patient in SNF for acute rehab Social History   Tobacco Use   Smoking status: Former    Packs/day: 1.00    Types: Cigarettes   Smokeless tobacco: Never   Tobacco comments:    Quit smoking greater than 20 yrs ago per wife on 08/18/21.  Started smoking at 71 yrs old per wife.  Substance Use Topics   Alcohol use: Not Currently    Comment: occasional wine    FAMILY HX:  Family History  Problem Relation Age of Onset   Diabetes Mellitus II Mother    Cancer Mother        STOMACH   Diabetes Mellitus II Brother    Other Father        UNKOWN   Healthy Daughter    Diabetes Son     Review of lab tests/diagnostics   Recent Labs  Lab 09/01/21 1302  WBC 11.6*  HGB 8.9*  HCT 27.7*  PLT 284  MCV 91.7   Recent Labs  Lab 09/01/21 1645  NA 132*  K 4.5  CL 96*  CO2 24  BUN 43*  CREATININE 4.72*  GLUCOSE 104*   Recent Labs  Lab 09/01/21 1645  AST 23  ALT 9  ALKPHOS 57    ALLERGIES: No Known Allergies    PERTINENT MEDICATIONS:  Outpatient Encounter Medications as of 09/06/2021  Medication Sig   acetaminophen (TYLENOL) 325 MG tablet Take 2 tablets (650 mg total) by mouth every 6 (six) hours as needed for moderate pain.   acidophilus (RISAQUAD) CAPS capsule Take 2 capsules by mouth 3 (three) times daily. (Patient not taking: Reported on 08/12/2021)   allopurinol (ZYLOPRIM) 100 MG tablet Take 100 mg by mouth at bedtime.   apixaban (ELIQUIS) 5 MG TABS tablet Take 1 tablet (5 mg total) by mouth 2 (two) times daily.   aspirin 81 MG chewable tablet Chew 81 mg by mouth daily.    atorvastatin (LIPITOR) 40 MG tablet Take 40 mg by mouth at bedtime.   b complex-vitamin  c-folic acid (NEPHRO-VITE) 0.8 MG TABS tablet Take 1 tablet by mouth daily.   feeding supplement (BOOST HIGH PROTEIN) LIQD Take 1 Container by mouth daily as needed (nutrition).   folic acid (FOLVITE) 1 MG tablet Take 1 tablet (1 mg total) by mouth daily.   hydrocortisone (ANUSOL-HC) 25 MG suppository Place 1 suppository (25 mg total) rectally 2 (two) times daily. (Patient not taking: Reported on 08/12/2021)   insulin aspart (NOVOLOG) 100 UNIT/ML injection Insulin sliding scale: Blood sugar  120-150   3units                       151-200   4units                       201-250   7units                       251- 300  11units                       301-350   15uints  351-400   20units                       >400         call MD immediately   Insulin Pen Needle 32G X 4 MM MISC 1 each by Other route as needed (insulin).    Inulin (FIBER CHOICE PO) Take 1 tablet by mouth daily.   levalbuterol (XOPENEX HFA) 45 MCG/ACT inhaler Inhale 2 puffs into the lungs every 8 (eight) hours as needed for wheezing.   lidocaine-prilocaine (EMLA) cream Apply 1 application topically 3 (three) times a week. 30 minutes prior to Dialysis - MWF   melatonin 5 MG TABS Take 1 tablet (5 mg total) by mouth at bedtime. (Patient taking differently: Take 5 mg by mouth at bedtime as needed (sleep).)   molnupiravir EUA (LAGEVRIO) 200 mg CAPS capsule Take 4 capsules (800 mg total) by mouth 2 (two) times daily for 5 days.   molnupiravir EUA (LAGEVRIO) 200 MG CAPS capsule Take 4 capsules (800 mg total) by mouth 2 (two) times daily for 5 days.   Nystatin (GERHARDT'S BUTT CREAM) CREA Apply 1 application topically 4 (four) times daily. Mix desitin with antifugal cream and apply a layer to buttocks/scrotum.   nystatin (MYCOSTATIN/NYSTOP) powder Apply topically 3 (three) times daily. (Patient not taking: Reported on 08/12/2021)   nystatin cream (MYCOSTATIN) Apply topically 2 (two) times daily. (Patient not taking: Reported on  08/12/2021)   polyethylene glycol (MIRALAX / GLYCOLAX) 17 g packet Take 17 g by mouth daily as needed for mild constipation.   predniSONE (DELTASONE) 5 MG tablet Take 5 mg by mouth daily with breakfast.   sevelamer carbonate (RENVELA) 800 MG tablet Take 1 tablet (800 mg total) by mouth 3 (three) times daily with meals.   witch hazel-glycerin (TUCKS) pad Apply topically as needed for itching.   No facility-administered encounter medications on file as of 09/06/2021.   ROS  General: NAD EYES: No vision changes ENMT: No dysphagia no xerostomia Cardiovascular: No chest pain Pulmonary: No cough, SOB  Abdomen:no constipation or diarrhea GU: No dysuria or urinary frequency MSK:   ROM limitations, no falls reported Skin: No rashes or wounds Neurological: weakness Psych:  positive mood Heme/lymph/immuno: No bruises or abnormal bleeding   PHYSICAL EXAM  Constitutional: In no acute distress, well developed and well nourished Cardiovascular: regular rate and rhythm Pulmonary: no cough, no increased work of breathing, normal respiratory effort Abdomen: soft, non tender, positive bowel sounds in all quadrants GU:  no suprapubic tenderness Eyes: Normal lids, no discharge, sclera anicteric ENMT: Moist mucous membranes Musculoskeletal: Right BKA, left AKA-Staples to stump intact, dry no signs and symptoms of infection Skin: no rash to visible skin, dry skin,warm without cyanosis Psych: non-anxious affect Neurological: Weakness but otherwise non focal Heme/lymph/immuno: no bruises, no bleeding  Thank you for the opportunity to participate in the care of [place]. Please call our office at [phone] if we can be of additional assistance.  Note: Portions of this note were generated with [employer]. Dictation errors may occur despite best attempts at proofreading.  Teodoro Spray, NP

## 2021-09-17 ENCOUNTER — Emergency Department (HOSPITAL_COMMUNITY)
Admission: EM | Admit: 2021-09-17 | Discharge: 2021-09-17 | Disposition: A | Discharge status: Home / Self Care | Payer: Medicare Other | Attending: Emergency Medicine | Admitting: Emergency Medicine

## 2021-09-17 ENCOUNTER — Other Ambulatory Visit: Payer: Self-pay

## 2021-09-17 ENCOUNTER — Non-Acute Institutional Stay: Payer: Medicare Other | Admitting: Hospice

## 2021-09-17 DIAGNOSIS — E1122 Type 2 diabetes mellitus with diabetic chronic kidney disease: Secondary | ICD-10-CM | POA: Insufficient documentation

## 2021-09-17 DIAGNOSIS — Z992 Dependence on renal dialysis: Secondary | ICD-10-CM | POA: Insufficient documentation

## 2021-09-17 DIAGNOSIS — I120 Hypertensive chronic kidney disease with stage 5 chronic kidney disease or end stage renal disease: Secondary | ICD-10-CM | POA: Diagnosis not present

## 2021-09-17 DIAGNOSIS — M25141 Fistula, right hand: Secondary | ICD-10-CM | POA: Insufficient documentation

## 2021-09-17 DIAGNOSIS — Z7982 Long term (current) use of aspirin: Secondary | ICD-10-CM | POA: Insufficient documentation

## 2021-09-17 DIAGNOSIS — N186 End stage renal disease: Secondary | ICD-10-CM | POA: Insufficient documentation

## 2021-09-17 DIAGNOSIS — Z794 Long term (current) use of insulin: Secondary | ICD-10-CM | POA: Diagnosis not present

## 2021-09-17 DIAGNOSIS — T82838A Hemorrhage of vascular prosthetic devices, implants and grafts, initial encounter: Secondary | ICD-10-CM

## 2021-09-17 NOTE — ED Notes (Signed)
O2 drops to 95% while sleeping. Placed on [place] @ 2LPM. Family denies HX sleep apnea.

## 2021-09-17 NOTE — ED Provider Notes (Signed)
[facility] EMERGENCY DEPARTMENT Provider Note   CSN: [id number] Arrival date & time: 09/17/21  0013     History  Chief Complaint  Patient presents with   Arm Injury    Fistula issue    Jonathan [name]. is a 71 y.o. male.  Patient is a 71 year old male with extensive past medical history of diabetes, hypertension, end-stage renal disease with previous renal transplant and is back on hemodialysis.  He also has history of right below the knee amputation and left above-the-knee amputation secondary to peripheral vascular disease.  Patient presenting today with complaints of swelling of the right arm.  This is the same arm where his dialysis fistula is located.  He was last dialyzed Wednesday and has been oozing blood from the fistula site since.  Patient denies fevers or chills.  He denies to me he is experiencing discomfort, however history is limited secondary to dementia.  The history is provided by the patient.      Home Medications Prior to Admission medications   Medication Sig Start Date End Date Taking? Authorizing Provider  acetaminophen (TYLENOL) 325 MG tablet Take 2 tablets (650 mg total) by mouth every 6 (six) hours as needed for moderate pain. 08/28/21 08/28/22  Florencia Reasons, MD  acidophilus (RISAQUAD) CAPS capsule Take 2 capsules by mouth 3 (three) times daily. Patient not taking: Reported on 08/12/2021 06/23/21   Love, Ivan Anchors, PA-C  allopurinol (ZYLOPRIM) 100 MG tablet Take 100 mg by mouth at bedtime. 06/09/14   [provider]  apixaban (ELIQUIS) 5 MG TABS tablet Take 1 tablet (5 mg total) by mouth 2 (two) times daily. 06/23/21   Love, Ivan Anchors, PA-C  aspirin 81 MG chewable tablet Chew 81 mg by mouth daily.  06/09/14   [provider]  atorvastatin (LIPITOR) 40 MG tablet Take 40 mg by mouth at bedtime.    [provider]  b complex-vitamin c-folic acid (NEPHRO-VITE) 0.8 MG TABS tablet Take 1 tablet by mouth daily. 05/08/20    [provider]  feeding supplement (BOOST HIGH PROTEIN) LIQD Take 1 Container by mouth daily as needed (nutrition).    [provider]  folic acid (FOLVITE) 1 MG tablet Take 1 tablet (1 mg total) by mouth daily. 05/11/21   Garvin Fila, MD  hydrocortisone (ANUSOL-HC) 25 MG suppository Place 1 suppository (25 mg total) rectally 2 (two) times daily. Patient not taking: Reported on 08/12/2021 06/23/21   Bary Leriche, PA-C  insulin aspart (NOVOLOG) 100 UNIT/ML injection Insulin sliding scale: Blood sugar  120-150   3units                       151-200   4units                       201-250   7units                       251- 300  11units                       301-350   15uints                       351-400   20units                       >400  call MD immediately 08/28/21   Florencia Reasons, MD  Insulin Pen Needle 32G X 4 MM MISC 1 each by Other route as needed (insulin).  09/10/16   [provider]  Inulin (FIBER CHOICE PO) Take 1 tablet by mouth daily.    [provider]  levalbuterol Penne Lash HFA) 45 MCG/ACT inhaler Inhale 2 puffs into the lungs every 8 (eight) hours as needed for wheezing. 09/21/19   Raiford Noble Latif, DO  lidocaine-prilocaine (EMLA) cream Apply 1 application topically 3 (three) times a week. 30 minutes prior to Dialysis - MWF 05/08/18   [provider]  melatonin 5 MG TABS Take 1 tablet (5 mg total) by mouth at bedtime. Patient taking differently: Take 5 mg by mouth at bedtime as needed (sleep). 06/23/21   Love, Ivan Anchors, PA-C  Nystatin (GERHARDT'S BUTT CREAM) CREA Apply 1 application topically 4 (four) times daily. Mix desitin with antifugal cream and apply a layer to buttocks/scrotum. 06/25/21   Love, Ivan Anchors, PA-C  nystatin (MYCOSTATIN/NYSTOP) powder Apply topically 3 (three) times daily. Patient not taking: Reported on 08/12/2021 06/23/21   Love, Ivan Anchors, PA-C  nystatin cream (MYCOSTATIN) Apply topically 2 (two) times  daily. Patient not taking: Reported on 08/12/2021 06/23/21   Love, Ivan Anchors, PA-C  polyethylene glycol (MIRALAX / GLYCOLAX) 17 g packet Take 17 g by mouth daily as needed for mild constipation. 06/10/21   Terrilee Croak, MD  predniSONE (DELTASONE) 5 MG tablet Take 5 mg by mouth daily with breakfast. 03/02/20   [provider]  sevelamer carbonate (RENVELA) 800 MG tablet Take 1 tablet (800 mg total) by mouth 3 (three) times daily with meals. 06/25/21   Love, Ivan Anchors, PA-C  witch hazel-glycerin (TUCKS) pad Apply topically as needed for itching. 06/25/21   Bary Leriche, PA-C      Allergies    Patient has no known allergies.    Review of Systems   Review of Systems  All other systems reviewed and are negative.  Physical Exam Updated Vital Signs BP (!) 136/100 (BP Location: Left Arm)    Pulse 87    Temp 98.2 F (36.8 C) (Oral)    Resp 15    SpO2 97%  Physical Exam Vitals and nursing note reviewed.  Constitutional:      Appearance: Normal appearance.  HENT:     Head: Normocephalic and atraumatic.  Pulmonary:     Effort: Pulmonary effort is normal.  Musculoskeletal:     Comments: The right arm is edematous from the shoulder through the fingers.  The fistula site has a dressing in place with a small amount of blood staining the dressing.  There is no warmth, redness, or purulent drainage.  Neurological:     General: No focal deficit present.     Mental Status: He is alert.     Comments: Patient is awake and alert what is somewhat confused.    ED Results / Procedures / Treatments   Labs (all labs ordered are listed, but only abnormal results are displayed) Labs Reviewed - No data to display  EKG None  Radiology No results found.  Procedures Procedures    Medications Ordered in ED Medications - No data to display  ED Course/ Medical Decision Making/ A&P   This patient presents to the ED for concern of right arm swelling and bleeding from dialysis graft, this  involves an extensive number of treatment options, and is a complaint that carries with it a high risk of complications  and morbidity.  The differential diagnosis includes stenosis of the graft   Co morbidities that complicate the patient evaluation  History of dementia   Additional history obtained:  Additional history obtained from wife who is present at bedside No external records needed for review   Lab Tests:  No laboratory studies obtained   Imaging Studies ordered:  No imaging studies indicated were obtained I agree with the radiologist interpretation   Cardiac Monitoring:  None   Medicines ordered and prescription drug management:  No medications ordered or indicated I have reviewed the patients home medicines and have made adjustments as needed   Test Considered:  Patient will likely need a fistulogram per Dr. Carlis Abbott   Critical Interventions:  None   Consultations Obtained:  I requested consultation with the Dr. Carlis Abbott from vascular surgery,  and discussed lab and imaging findings as well as pertinent plan - they recommend: Fistulogram to be performed early next week.   Problem List / ED Course:  Patient sent from his extended care facility for evaluation of swelling of the right arm and bleeding from his dialysis fistula site.  Patient has had no active bleeding while here.  The dressing that was applied prior to coming here is stained with a small amount of blood, but there is no active bleeding.  There is no warmth, erythema, or tissue breakdown overlying the graft.  The graft is pulsatile. I discussed care with Dr. Carlis Abbott who feels as though patient will need a fistulogram and will make arrangements for this to be performed next week.  Otherwise patient has had no additional bleeding and I feel can safely be discharged.  A Coban dressing will be placed on the arm. Dr. Carlis Abbott feels as though the dialysis graft is safe to use for dialysis  tomorrow.   Reevaluation:  After the interventions noted above, I reevaluated the patient and found that they have : Improved   Social Determinants of Health:  History of dementia and resident of a skilled nursing facility   Dispostion:  After consideration of the diagnostic results and the patients response to treatment, I feel that the patent would benefit from follow-up with vascular surgery for a fistulogram.     Final Clinical Impression(s) / ED Diagnoses Final diagnoses:  None    Rx / DC Orders ED Discharge Orders     None         Veryl Speak, MD 09/17/21 [phone]

## 2021-09-17 NOTE — ED Notes (Signed)
PTAR  here to return to [facility]

## 2021-09-17 NOTE — ED Triage Notes (Signed)
BIB [facility] from [facility]MWF Dialysis patient with right arm fistula. Bleeding since Wednesday. Swelling to entire right upper extremity for 1 week. 71 yr old fistula. A and disoriented per dementia baseline. Family at bedside.

## 2021-09-17 NOTE — Discharge Instructions (Signed)
Dr. Ainsley Spinner office will call to make arrangements for a fistulogram.  Return to the emergency department if bleeding worsens, or for other new and concerning symptoms.  The dialysis graft is safe for use tomorrow at dialysis.

## 2021-09-21 ENCOUNTER — Other Ambulatory Visit: Payer: Self-pay

## 2021-09-21 ENCOUNTER — Ambulatory Visit: Payer: Medicare Other | Admitting: Physician Assistant

## 2021-09-21 VITALS — BP 133/76 | HR 90 | Temp 98.3°F | Resp 18 | Ht 69.0 in | Wt 113.1 lb

## 2021-09-21 DIAGNOSIS — I739 Peripheral vascular disease, unspecified: Secondary | ICD-10-CM

## 2021-09-21 NOTE — Progress Notes (Signed)
POST OPERATIVE OFFICE NOTE    CC:  F/u for surgery  HPI:  This is a 71 y.o. male who has history of PAD B lower extremities resulting in Right BKA and most recently the left AKA.  The left  heel ulcer had developed osteomyelitis without revascularization.    Pt returns today for follow up.  He and his wife denied pain or problems with healing of the left AKA stump.  Staples are in place.  He resides at a SNF.   They denied fever or chills.    No Known Allergies  Current Outpatient Medications  Medication Sig Dispense Refill   acetaminophen (TYLENOL) 325 MG tablet Take 2 tablets (650 mg total) by mouth every 6 (six) hours as needed for moderate pain.  0   acidophilus (RISAQUAD) CAPS capsule Take 2 capsules by mouth 3 (three) times daily. 90 capsule 0   allopurinol (ZYLOPRIM) 100 MG tablet Take 100 mg by mouth at bedtime.     apixaban (ELIQUIS) 5 MG TABS tablet Take 1 tablet (5 mg total) by mouth 2 (two) times daily. 60 tablet 0   aspirin 81 MG chewable tablet Chew 81 mg by mouth daily.      atorvastatin (LIPITOR) 40 MG tablet Take 40 mg by mouth at bedtime.     b complex-vitamin c-folic acid (NEPHRO-VITE) 0.8 MG TABS tablet Take 1 tablet by mouth daily.     feeding supplement (BOOST HIGH PROTEIN) LIQD Take 1 Container by mouth daily as needed (nutrition).     folic acid (FOLVITE) 1 MG tablet Take 1 tablet (1 mg total) by mouth daily. 30 tablet 3   hydrocortisone (ANUSOL-HC) 25 MG suppository Place 1 suppository (25 mg total) rectally 2 (two) times daily. 14 suppository 0   insulin aspart (NOVOLOG) 100 UNIT/ML injection Insulin sliding scale: Blood sugar  120-150   3units                       151-200   4units                       201-250   7units                       251- 300  11units                       301-350   15uints                       351-400   20units                       >400         call MD immediately 10 mL 0   Insulin Pen Needle 32G X 4 MM MISC 1 each by Other  route as needed (insulin).      Inulin (FIBER CHOICE PO) Take 1 tablet by mouth daily.     levalbuterol (XOPENEX HFA) 45 MCG/ACT inhaler Inhale 2 puffs into the lungs every 8 (eight) hours as needed for wheezing. 1 Inhaler 12   lidocaine-prilocaine (EMLA) cream Apply 1 application topically 3 (three) times a week. 30 minutes prior to Dialysis - MWF  11   melatonin 5 MG TABS Take 1 tablet (5 mg total) by mouth at bedtime. (Patient taking differently: Take 5 mg by mouth at bedtime as needed (sleep).)  30 tablet 0   Nystatin (GERHARDT'S BUTT CREAM) CREA Apply 1 application topically 4 (four) times daily. Mix desitin with antifugal cream and apply a layer to buttocks/scrotum.     nystatin (MYCOSTATIN/NYSTOP) powder Apply topically 3 (three) times daily. 60 g 0   nystatin cream (MYCOSTATIN) Apply topically 2 (two) times daily. 30 g 0   polyethylene glycol (MIRALAX / GLYCOLAX) 17 g packet Take 17 g by mouth daily as needed for mild constipation. 14 each 0   predniSONE (DELTASONE) 5 MG tablet Take 5 mg by mouth daily with breakfast.     sevelamer carbonate (RENVELA) 800 MG tablet Take 1 tablet (800 mg total) by mouth 3 (three) times daily with meals. 90 tablet 0   witch hazel-glycerin (TUCKS) pad Apply topically as needed for itching. 40 each 12   No current facility-administered medications for this visit.     ROS:  See HPI  Physical Exam:     Incision:  well healed without ischemic changes. Extremities:  Left AKA warm to touch, right BKA well healed  Lung: non labored breathing    Assessment/Plan:  This is a 71 y.o. male who is s/p:Left AKA 08/19/21 and right BKA 11/02/20 both performed by Dr. Stanford Breed.  He had developed wounds with tissue loss and no re vascularization options.       Staples removed left AKA today in office.  He tolerated this well.  Wash with soap and water as needed. F/U as needed in the future.     Roxy Horseman PA-C [facility] [phone]   Clinic MD:  Stanford Breed

## 2021-10-14 ENCOUNTER — Telehealth: Payer: Self-pay

## 2021-10-14 ENCOUNTER — Ambulatory Visit: Payer: Medicare Other | Admitting: Physician Assistant

## 2021-10-14 NOTE — Telephone Encounter (Signed)
Spoke with patient's spouse Jonathan [name] and scheduled a telephonic Palliative Consult for 10/18/21 @ 2 PM.  ? ?Consent obtained; updated Outlook/Netsmart/Team List and Epic.  ? ? ?

## 2021-10-18 ENCOUNTER — Other Ambulatory Visit: Payer: Medicare Other | Admitting: Hospice

## 2021-10-18 ENCOUNTER — Other Ambulatory Visit: Payer: Self-pay

## 2021-10-18 DIAGNOSIS — N186 End stage renal disease: Secondary | ICD-10-CM

## 2021-10-18 DIAGNOSIS — Z515 Encounter for palliative care: Secondary | ICD-10-CM

## 2021-10-18 NOTE — Progress Notes (Signed)
? ? ?PATIENT NAME: Jonathan MUILENBURG Sr. ?[phone] ?[place] [address] ?[phone] (home)  ?DOB: 07/24/1951 ?MRN: [id number] ? ?PRIMARY CARE PROVIDER:    ?Ginger Organ., MD,  ?[address] ?[place] [address] ?[phone] ? ?REFERRING PROVIDER:   ?Martinique Blattenberger, NP ? ?RESPONSIBLE PARTY: spouse ?Emergency contact: Kayleen Memos [phone] ? ?TELEHEALTH VISIT STATEMENT ?Due to the COVID-19 crisis, this visit was done via telemedicine from my office and it was initiated and consent by this patient and or family. Video-audio (telehealth) contact was unable to be done due to technical barriers from the patient?s side. ?I connected with patient OR PROXY by a telephone  and verified that I am speaking with the correct person. I discussed the limitations of evaluation and management by telemedicine. The patient/proxy expressed understanding and agreed to proceed.  ? ?Visit is to build trust and highlight Palliative Medicine as specialized medical care for people living with serious illness, aimed at facilitating better quality of life through symptoms relief, assisting with advance care plan and establishing goals of care.  ?Palliative care team will continue to support patient, patient's family, and medical team. ? ?RECOMMENDATIONS/PLAN:  ? ?Advance Care Planning/CODE STATUS: Patient is a full code ? ?GOALS OF CARE: Goals of care include to maximize quality of life and symptom management. ? ?Symptom Management/Plan:  ?Weakness: PT OT completed in acute rehab.  Spouse reports patient is nonambulatory.  Weakness related to recent hospitalization 08/19/21 - 08/28/21, for left AKA and recent COVID-19 infection.   ?End-stage renal disease on hemodialysis: Continues on hemodialysis as scheduled Monday, Wednesday and Friday. ?Vascular dementia: FAST 6D.  Memory loss/confusion and baseline per spouse. Encourage reminiscence, word search/puzzles, cueing for recollection.  Promote calm approach  and engaging environment.  Optimize ongoing supportive care. Continue on going supportive care. ? ?Palliative will continue to monitor for symptom management/decline and make recommendations as needed. Return 6 weeks or prn. Encouraged to call provider sooner with any concerns.  ? ?Family /Caregiver/Community Supports: Patient lives at home with his wife [name] ? ?Chief complaint: Follow-up visit ?HISTORY OF PRESENT ILLNESS:  Jonathan [name]. is a 71 y.o. 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  History obtained from review of EMR, discussion patient/family.  ?Review and summarization of Epic records shows history from other than patient. Rest of 10 point ROS asked and negative. ? ?Palliative Care was asked to follow this patient by consultation request of Ginger Organ., MD to help address complex decision making in the context of advance care planning and goals of care clarification.   ? ?HOSPICE ELIGIBILITY/DIAGNOSIS: TBD ? ?PAST MEDICAL HISTORY:  ?Past Medical History:  ?Diagnosis Date  ? CAD (coronary artery disease)   ? CHF (congestive heart failure) ([place])   ? Diabetes mellitus without complication ([place])   ? type 2  ? DM (diabetes mellitus) ([place])   ? ESRD (end stage renal disease) ([place])   ? dialysis Mon Wed Fri  ? FUO (fever of unknown origin) 09/17/2019  ? History of blood transfusion   ? Hyperlipidemia   ? Hypertension   ? Osteomyelitis ([place])   ? Peripheral arterial disease ([place])   ? PVD (peripheral vascular disease) ([place])   ? Renal disorder 2015  ? right kidney transplant  ? Stroke [facility])   ? Vascular dementia ([place])   ?  ? ?SOCIAL  HX: Patient in SNF for acute rehab ?Social History  ? ?Tobacco Use  ? Smoking status: Former  ?  Packs/day: 1.00  ?  Types: Cigarettes  ? Smokeless tobacco: Never  ? Tobacco  comments:  ?  Quit smoking greater than 20 yrs ago per wife on 08/18/21.  Started smoking at 15 yrs old per wife.  ?Substance Use Topics  ? Alcohol use: Not Currently  ?  Comment: occasional wine  ? ? ?FAMILY HX:  ?Family History  ?Problem Relation Age of Onset  ? Diabetes Mellitus II Mother   ? Cancer Mother   ?     STOMACH  ? Diabetes Mellitus II Brother   ? Other Father   ?     UNKOWN  ? Healthy Daughter   ? Diabetes Son   ? ? ?ALLERGIES: No Known Allergies   ? ?PERTINENT MEDICATIONS:  ?Outpatient Encounter Medications as of 10/18/2021  ?Medication Sig  ? acetaminophen (TYLENOL) 325 MG tablet Take 2 tablets (650 mg total) by mouth every 6 (six) hours as needed for moderate pain.  ? acidophilus (RISAQUAD) CAPS capsule Take 2 capsules by mouth 3 (three) times daily.  ? allopurinol (ZYLOPRIM) 100 MG tablet Take 100 mg by mouth at bedtime.  ? apixaban (ELIQUIS) 5 MG TABS tablet Take 1 tablet (5 mg total) by mouth 2 (two) times daily.  ? aspirin 81 MG chewable tablet Chew 81 mg by mouth daily.   ? atorvastatin (LIPITOR) 40 MG tablet Take 40 mg by mouth at bedtime.  ? b complex-vitamin c-folic acid (NEPHRO-VITE) 0.8 MG TABS tablet Take 1 tablet by mouth daily.  ? feeding supplement (BOOST HIGH PROTEIN) LIQD Take 1 Container by mouth daily as needed (nutrition).  ? folic acid (FOLVITE) 1 MG tablet Take 1 tablet (1 mg total) by mouth daily.  ? hydrocortisone (ANUSOL-HC) 25 MG suppository Place 1 suppository (25 mg total) rectally 2 (two) times daily.  ? insulin aspart (NOVOLOG) 100 UNIT/ML injection Insulin sliding scale: ?Blood sugar  120-150   3units ?                      151-200   4units ?                      201-250   7units ?                      251- 300  11units ?                      301-350   15uints ?                      351-400   20units ?                      >400         call MD immediately  ? Insulin Pen Needle 32G X 4 MM MISC 1 each by Other route as needed (insulin).   ? Inulin (FIBER CHOICE PO) Take 1  tablet by mouth daily.  ? levalbuterol (XOPENEX HFA) 45 MCG/ACT inhaler Inhale 2 puffs into the lungs every 8 (eight) hours as needed for wheezing.  ? lidocaine-prilocaine (EMLA) cream Apply 1 application topically 3 (three) times a week. 30 minutes prior to Dialysis - MWF  ? melatonin 5 MG TABS Take 1 tablet (5 mg total)  by mouth at bedtime. (Patient taking differently: Take 5 mg by mouth at bedtime as needed (sleep).)  ? Nystatin (GERHARDT'S BUTT CREAM) CREA Apply 1 application topically 4 (four) times daily. Mix desitin with antifugal cream and apply a layer to buttocks/scrotum.  ? nystatin (MYCOSTATIN/NYSTOP) powder Apply topically 3 (three) times daily.  ? nystatin cream (MYCOSTATIN) Apply topically 2 (two) times daily.  ? polyethylene glycol (MIRALAX / GLYCOLAX) 17 g packet Take 17 g by mouth daily as needed for mild constipation.  ? predniSONE (DELTASONE) 5 MG tablet Take 5 mg by mouth daily with breakfast.  ? sevelamer carbonate (RENVELA) 800 MG tablet Take 1 tablet (800 mg total) by mouth 3 (three) times daily with meals.  ? witch hazel-glycerin (TUCKS) pad Apply topically as needed for itching.  ? ?No facility-administered encounter medications on file as of 10/18/2021.  ?I spent 20 minutes providing this consultation; time includes spent with patient/family, chart review and documentation. More than 50% of the time in this consultation was spent on care coordination ? ? ?Thank you for the opportunity to participate in the care of [place]. Please call our office at [phone] if we can be of additional assistance. ? ?Note: Portions of this note were generated with [employer]. Dictation errors may occur despite best attempts at proofreading. ? ?Teodoro Spray, NP ?

## 2021-10-26 ENCOUNTER — Encounter: Payer: Self-pay | Admitting: Nurse Practitioner

## 2021-10-26 ENCOUNTER — Ambulatory Visit (INDEPENDENT_AMBULATORY_CARE_PROVIDER_SITE_OTHER): Payer: Medicare Other | Admitting: Nurse Practitioner

## 2021-10-26 ENCOUNTER — Other Ambulatory Visit: Payer: Self-pay | Admitting: Nurse Practitioner

## 2021-10-26 VITALS — BP 120/80

## 2021-10-26 DIAGNOSIS — N186 End stage renal disease: Secondary | ICD-10-CM

## 2021-10-26 DIAGNOSIS — D649 Anemia, unspecified: Secondary | ICD-10-CM | POA: Diagnosis not present

## 2021-10-26 DIAGNOSIS — R194 Change in bowel habit: Secondary | ICD-10-CM

## 2021-10-26 DIAGNOSIS — Z992 Dependence on renal dialysis: Secondary | ICD-10-CM

## 2021-10-26 MED ORDER — FAMOTIDINE 20 MG PO TABS: 20.0000 mg | ORAL_TABLET | Freq: Every day | ORAL | 1 refills | Status: DC

## 2021-10-26 NOTE — Patient Instructions (Addendum)
?  1) Continue Fiber Gummie once daily x 1 week, if no improvement then switch to Benefiber one tablespoon mixed in any liquid or soft food once daily to bulk up stool.  ? ?2) Lactaid 1 or 2 tabs with each dairy product ? ?3) Bacteria probiotic one cap daily  ? ?4) Please have your dialysis center collect a CBC, iron, iron saturation, TIBC and ferritin panel with your next dialysis session  ? ?5) Follow up with Dr. Hilarie Fredrickson in 2 to months (addendum: follow up appointment will be changed to Dr. Ardis Hughs as patient was seen in the hospital by Dr. Ardis Hughs 06/2021).  ? ?6) Famotidine 20mg  one po QH # 30, 1 refill  ?

## 2021-10-26 NOTE — Progress Notes (Signed)
? ? ? ?10/26/2021 ?Jonathan [name] ?[id number] ?Aug 30, 1950 ? ? ?CHIEF COMPLAINT: Diarrhea  ? 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  He is followed by palliative care.  He remains a full code. ? ?He presents to our office today as referred by Reesa Chew PA-C for further evaluation for diarrhea. His wife provides his past medical history and answers my questions.  He is able to answer simple questions with yes or no responses.  His wife describes his diarrhea as most she soft stools which occur once or twice daily and sometimes he skips a day.  His bowel movements are messy to clean up.  No rectal bleeding or black stools.  He intermittently has constipation which is relieved by taking MiraLAX for few days and eating prunes.  He has not taken any MiraLAX for the past 2 weeks.  His wife reported his stools were black for less than a week which occurred more than 3 months ago without recurrence.  No Pepto-Bismol or oral iron use.  He has never required an EGD.  He underwent a colonoscopy 20 years ago which his wife reported was normal.  His appetite is okay with no significant change in his oral intake.  Ever, his wife is concerned he makes some gurgling noise in his throat at nighttime without evidence of any respiratory distress.  She is concerned he might have acid reflux.  No obvious weight changes.  He is on Eliquis and ASA.  Hemoglobin level 11.4 on 10/13/2021 per [facility]. ? ?In review of his epic records, he was seen by our inpatient hospital GI service   Azucena Freed, PA-C and Dr. Ardis Hughs) 06/23/2021 due to having rectal bleeding after he received a molasses enema due to having a cup of fecal impaction.  His rectal bleeding was assessed as nonsignificant and likely hemorrhoidal.  He received a GoLytely bowel prep and his fecal impaction resolved.  Endoscopic evaluation was not recommended as he was high risk for any invasive procedure secondary to his multiple comorbidities ? ? ?CBC Latest Ref Rng & Units 09/01/2021 08/27/2021 08/26/2021  ?WBC 4.0 - 10.5 K/uL 11.6(H) 6.8 7.0  ?Hemoglobin 13.0 - 17.0 g/dL 8.9(L) 8.2(L) 8.4(L)  ?Hematocrit 39.0 - 52.0 % 27.7(L) 25.1(L) 27.2(L)  ?Platelets 150 - 400 K/uL 284 239 225  ?  ?CMP Latest Ref Rng & Units 09/01/2021 08/27/2021 08/23/2021  ?Glucose 70 - 99 mg/dL 104(H) 117(H) 243(H)  ?BUN 8 - 23 mg/dL 43(H) 55(H) 43(H)  ?Creatinine 0.61 - 1.24 mg/dL 4.72(H) 5.70(H) 5.58(H)  ?Sodium 135 - 145 mmol/L 132(L) 131(L) 134(L)  ?Potassium 3.5 - 5.1 mmol/L 4.5 4.2 4.7  ?Chloride 98 - 111 mmol/L 96(L) 95(L) 94(L)  ?CO2 22 - 32 mmol/L 24 26 27   ?Calcium 8.9 - 10.3 mg/dL 8.0(L) 8.7(L) 8.1(L)  ?Total Protein 6.5 - 8.1 g/dL 5.3(L) - -  ?Total Bilirubin 0.3 - 1.2 mg/dL 0.6 - -  ?Alkaline Phos 38 - 126 U/L 57 - -  ?AST 15 - 41 U/L 23 - -  ?ALT 0 -  44 U/L 9 - -  ?  ? ?Past Medical History:  ?Diagnosis Date  ? CAD (coronary artery disease)   ? CHF (congestive heart failure) ([place])   ? Diabetes mellitus without complication ([place])   ? type 2  ? DM (diabetes mellitus) ([place])   ? ESRD (end stage renal disease) ([place])   ? dialysis Mon Wed Fri  ? FUO (fever of unknown origin) 09/17/2019  ? History of blood transfusion   ? Hyperlipidemia   ? Hypertension   ? Osteomyelitis ([place])   ? Peripheral arterial disease ([place])   ? PVD (peripheral vascular disease) ([place])   ? Renal disorder 2015  ? right kidney transplant  ? Stroke [facility])   ? Vascular dementia ([place])   ? ?Past Surgical History:  ?Procedure Laterality Date  ? ABDOMINAL AORTOGRAM N/A 08/05/2020  ? Procedure:  ABDOMINAL AORTOGRAM;  Surgeon: [name], MD;  Location: [place] CV LAB;  Service: Cardiovascular;  Laterality: N/A;  ? ABDOMINAL AORTOGRAM W/LOWER EXTREMITY N/A 09/09/2020  ? Procedure: ABDOMINAL AORTOGRAM W/LOWER EXTREMITY;  Surgeon: [name], MD;  Location: [place] CV LAB;  Service: Cardiovascular;  Laterality: N/A;  ? AMPUTATION Right 08/06/2020  ? Procedure: Right Fifth Toe Ray Amputation;  Surgeon: [name], MD;  Location: [place];  Service: Vascular;  Laterality: Right;  ? AMPUTATION Right 11/02/2020  ? Procedure: RIGHT AMPUTATION BELOW KNEE;  Surgeon: [name], MD;  Location: [place];  Service: Vascular;  Laterality: Right;  ? AMPUTATION Left 08/19/2021  ? Procedure: LEFT ABOVE KNEE AMPUTATION;  Surgeon: [name], MD;  Location: [facility] OR;  Service: Vascular;  Laterality: Left;  ? APPLICATION OF WOUND VAC Left 08/19/2021  ? Procedure: APPLICATION OF WOUND VAC;  Surgeon: [name], MD;  Location: [place];  Service: Vascular;  Laterality: Left;  ? BACK SURGERY    ? Has had 2 back surgeries  ? BUBBLE STUDY  09/18/2019  ? Procedure: BUBBLE STUDY;  Surgeon: Buford Dresser, MD;  Location: [facility] ENDOSCOPY;  Service: Cardiovascular;;  ? Depression    ? ENDARTERECTOMY FEMORAL Right 08/06/2020  ? Procedure: Right Ilio- Femoral Artery Endarterectomy;  Surgeon: [name], MD;  Location: [place];  Service: Vascular;  Laterality: Right;  ? HAND SURGERY Right   ? HAND SURGERY    ? HIP ARTHROPLASTY Left 06/04/2021  ? Procedure: ARTHROPLASTY BIPOLAR HIP (HEMIARTHROPLASTY);  Surgeon: Willaim Sheng, MD;  Location: [place];  Service: Orthopedics;  Laterality: Left;  ? IR DIALY SHUNT INTRO NEEDLE/INTRACATH INITIAL W/IMG RIGHT Right 06/07/2021  ? IR REMOVAL TUN CV CATH W/O FL  09/03/2019  ? KIDNEY TRANSPLANT    ? November 2015  ? LEG AMPUTATION BELOW KNEE Right   ? LOWER EXTREMITY ANGIOGRAPHY Bilateral 08/05/2020  ? Procedure: LOWER EXTREMITY ANGIOGRAPHY;  Surgeon: [name], MD;  Location: [place] CV LAB;  Service: Cardiovascular;  Laterality: Bilateral;  ? Memory loss    ? NEPHRECTOMY TRANSPLANTED ORGAN    ? PATCH ANGIOPLASTY Right 08/06/2020  ? Procedure: PATCH ANGIOPLASTY;  Surgeon: [name], MD;  [facility] OR;  Service: Vascular;  Laterality: Right;  ? PERIPHERAL VASCULAR BALLOON ANGIOPLASTY Right 09/09/2020  ? Procedure: PERIPHERAL VASCULAR BALLOON ANGIOPLASTY;  Surgeon: [name], MD;  Location: [place] CV LAB;  Service: Cardiovascular;  Laterality: Right;  popliteal  ? PERIPHERAL VASCULAR INTERVENTION Right 08/05/2020  ? Procedure: PERIPHERAL VASCULAR INTERVENTION;  Surgeon: [name], MD;  Location: [facility] INVASIVE CV  LAB;  Service: Cardiovascular;  Laterality: Right;  SFA  ? PERIPHERAL VASCULAR INTERVENTION Right 09/09/2020  ? Procedure: PERIPHERAL VASCULAR INTERVENTION;  Surgeon: [name], MD;  Location: [place] CV LAB;  Service: Cardiovascular;  Laterality: Right;  external iliac  ? TEE WITHOUT CARDIOVERSION N/A 09/18/2019  ? Procedure: TRANSESOPHAGEAL ECHOCARDIOGRAM (TEE);  Surgeon: Buford Dresser, MD;  Location: [place];  Service: Cardiovascular;  Laterality: N/A;  ? WOUND DEBRIDEMENT Right 10/08/2020  ? Procedure: DEBRIDEMENT WOUND RIGHT FOOT;  Surgeon: [name], MD;  Location: [place];  Service: Vascular;  Laterality: Right;  ? ?Social History: He is married. Quit smoking 20 years ago. Quit alcohol 20 years ago, he was never heavy drinker no drug use.  ? ?Family History: Mother had stomach cancer. Sister and brother had some form of cancer, further details are unknown.  Brother and mother had diabetes.  His son also has diabetes. ? ?No Known Allergies ?  ?Outpatient Encounter Medications as of 10/26/2021  ?Medication Sig  ? acetaminophen (TYLENOL) 325 MG tablet Take 2 tablets (650 mg total) by mouth every 6 (six) hours as needed for moderate pain.  ? acidophilus (RISAQUAD) CAPS capsule Take 2 capsules by  mouth 3 (three) times daily.  ? allopurinol (ZYLOPRIM) 100 MG tablet Take 100 mg by mouth at bedtime.  ? apixaban (ELIQUIS) 5 MG TABS tablet Take 1 tablet (5 mg total) by mouth 2 (two) times daily.  ? aspi

## 2021-10-27 ENCOUNTER — Other Ambulatory Visit: Payer: Medicare Other

## 2021-10-27 ENCOUNTER — Other Ambulatory Visit: Payer: Self-pay

## 2021-10-27 DIAGNOSIS — Z515 Encounter for palliative care: Secondary | ICD-10-CM

## 2021-10-27 NOTE — Progress Notes (Signed)
I agree with the above note, plan 

## 2021-11-04 NOTE — Progress Notes (Signed)
COMMUNITY PALLIATIVE CARE SW NOTE ? ?PATIENT NAME: Jonathan [name] ?DOB: 07-23-1951 ?MRN: [id number] ? ?PRIMARY CARE PROVIDER: Ginger Organ., MD ? ?RESPONSIBLE PARTY:  ?Acct ID - Guarantor Home Phone Work Phone Relationship Acct Type  ?[id number] - [place] [phone]  Self P/F  ?   [name] [place], [place] [address]  ? ?Due to the COVID-19 crisis, this virtual check-in visit was done via telephone from my office and it was initiated and consent by this patient and or family. ? ?SOCIAL WORK TELEPHONE ENCOUNTER (11:00 am -11:15 am) ? ?PC SW completed a telephonic encounter with patient 's wife-[name]. [name] advised that patient is a double amputee and she is having issues managing patient at home. Patient also has dementia. He is currently receiving nursing visits through [facility]. SW discussed patient's insurance and encouraged her to contact them to see if there are any services available under the insurance. SW also provided education regarding PACE and [facility] to consider as resources. SW also provided education regarding hospice and how it differs from palliative care. [name] advised that she will look into these resources. SW provided reassurance of support and encouraged patricia to call with any additional questions or concerns.  ? ?Katheren Puller, LCSW ? ?

## 2021-12-03 ENCOUNTER — Emergency Department (HOSPITAL_COMMUNITY): Payer: Medicare Other

## 2021-12-03 ENCOUNTER — Other Ambulatory Visit: Payer: Self-pay

## 2021-12-03 ENCOUNTER — Emergency Department (HOSPITAL_COMMUNITY)
Admission: EM | Admit: 2021-12-03 | Discharge: 2021-12-03 | Disposition: A | Discharge status: Home / Self Care | Payer: Medicare Other | Attending: Student | Admitting: Student

## 2021-12-03 ENCOUNTER — Encounter (HOSPITAL_COMMUNITY): Payer: Self-pay

## 2021-12-03 DIAGNOSIS — Z79899 Other long term (current) drug therapy: Secondary | ICD-10-CM | POA: Insufficient documentation

## 2021-12-03 DIAGNOSIS — I5022 Chronic systolic (congestive) heart failure: Secondary | ICD-10-CM | POA: Diagnosis not present

## 2021-12-03 DIAGNOSIS — E1122 Type 2 diabetes mellitus with diabetic chronic kidney disease: Secondary | ICD-10-CM | POA: Diagnosis not present

## 2021-12-03 DIAGNOSIS — Z7901 Long term (current) use of anticoagulants: Secondary | ICD-10-CM | POA: Insufficient documentation

## 2021-12-03 DIAGNOSIS — D649 Anemia, unspecified: Secondary | ICD-10-CM | POA: Insufficient documentation

## 2021-12-03 DIAGNOSIS — Z043 Encounter for examination and observation following other accident: Secondary | ICD-10-CM | POA: Insufficient documentation

## 2021-12-03 DIAGNOSIS — D72819 Decreased white blood cell count, unspecified: Secondary | ICD-10-CM | POA: Diagnosis not present

## 2021-12-03 DIAGNOSIS — I132 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 kidney disease, or end stage renal disease: Secondary | ICD-10-CM | POA: Insufficient documentation

## 2021-12-03 DIAGNOSIS — F0390 Unspecified dementia without behavioral disturbance: Secondary | ICD-10-CM | POA: Diagnosis not present

## 2021-12-03 DIAGNOSIS — I2510 Atherosclerotic heart disease of native coronary artery without angina pectoris: Secondary | ICD-10-CM | POA: Insufficient documentation

## 2021-12-03 DIAGNOSIS — N186 End stage renal disease: Secondary | ICD-10-CM | POA: Insufficient documentation

## 2021-12-03 DIAGNOSIS — Z794 Long term (current) use of insulin: Secondary | ICD-10-CM | POA: Insufficient documentation

## 2021-12-03 DIAGNOSIS — E878 Other disorders of electrolyte and fluid balance, not elsewhere classified: Secondary | ICD-10-CM | POA: Diagnosis not present

## 2021-12-03 DIAGNOSIS — Z7984 Long term (current) use of oral hypoglycemic drugs: Secondary | ICD-10-CM | POA: Diagnosis not present

## 2021-12-03 DIAGNOSIS — W19XXXA Unspecified fall, initial encounter: Secondary | ICD-10-CM

## 2021-12-03 LAB — COMPREHENSIVE METABOLIC PANEL
ALT: 12 U/L (ref 0–44)
AST: 15 U/L (ref 15–41)
Albumin: 3.4 g/dL — ABNORMAL LOW (ref 3.5–5.0)
Alkaline Phosphatase: 59 U/L (ref 38–126)
Anion gap: 9 (ref 5–15)
BUN: 13 mg/dL (ref 8–23)
CO2: 33 mmol/L — ABNORMAL HIGH (ref 22–32)
Calcium: 8.7 mg/dL — ABNORMAL LOW (ref 8.9–10.3)
Chloride: 96 mmol/L — ABNORMAL LOW (ref 98–111)
Creatinine, Ser: 1.94 mg/dL — ABNORMAL HIGH (ref 0.61–1.24)
GFR, Estimated: 37 mL/min — ABNORMAL LOW (ref >60)
Glucose, Bld: 94 mg/dL (ref 70–99)
Potassium: 3.8 mmol/L (ref 3.5–5.1)
Sodium: 138 mmol/L (ref 135–145)
Total Bilirubin: 0.6 mg/dL (ref 0.3–1.2)
Total Protein: 6.6 g/dL (ref 6.5–8.1)

## 2021-12-03 LAB — CBC WITH DIFFERENTIAL/PLATELET
Abs Immature Granulocytes: 0.04 10*3/uL (ref 0.00–0.07)
Basophils Absolute: 0 10*3/uL (ref 0.0–0.1)
Basophils Relative: 1 %
Eosinophils Absolute: 0 10*3/uL (ref 0.0–0.5)
Eosinophils Relative: 1 %
HCT: 37.8 % — ABNORMAL LOW (ref 39.0–52.0)
Hemoglobin: 12.4 g/dL — ABNORMAL LOW (ref 13.0–17.0)
Immature Granulocytes: 1 %
Lymphocytes Relative: 30 %
Lymphs Abs: 1 10*3/uL (ref 0.7–4.0)
MCH: 31.6 pg (ref 26.0–34.0)
MCHC: 32.8 g/dL (ref 30.0–36.0)
MCV: 96.4 fL (ref 80.0–100.0)
Monocytes Absolute: 0.3 10*3/uL (ref 0.1–1.0)
Monocytes Relative: 8 %
Neutro Abs: 2.1 10*3/uL (ref 1.7–7.7)
Neutrophils Relative %: 59 %
Platelets: UNDETERMINED 10*3/uL (ref 150–400)
RBC: 3.92 MIL/uL — ABNORMAL LOW (ref 4.22–5.81)
RDW: 17.9 % — ABNORMAL HIGH (ref 11.5–15.5)
WBC: 3.5 10*3/uL — ABNORMAL LOW (ref 4.0–10.5)
nRBC: 0 % (ref 0.0–0.2)

## 2021-12-03 NOTE — Discharge Instructions (Addendum)
You were seen here today for evaluation of after a fall today. Your imaging did not show any new findings. You lab work did show a slightly low white blood cell count that I would like for you to have checked at the dialysis center or your PCP. If he has any change of behavior, please bring him to the ER immediately.  ? ?Get help right away if: ?You have: ?A very bad headache that is not helped by medicine. ?Trouble walking or weakness in your arms and legs. ?Clear or bloody fluid coming from your nose or ears. ?Changes in how you see (vision). ?A seizure. ?More confusion or more grumpy moods. ?Your symptoms get worse. ?You are sleepier than normal and have trouble staying awake. ?You lose your balance. ?The black centers of your eyes (pupils) change in size. ?Your speech is slurred. ?Your dizziness gets worse. ?You vomit. ?These symptoms may be an emergency. Do not wait to see if the symptoms will go away. Get medical help right away. Call your local emergency services (911 in the [place] Do not drive yourself to the hospital. ?

## 2021-12-03 NOTE — ED Notes (Signed)
Patient transported to CT 

## 2021-12-03 NOTE — ED Triage Notes (Signed)
Pt BIB [facility] from dialysis center d/t mechanical fall. Per EMS, pt was waiting on the bus after completing the treatment. Staff found the pt outside of the parking lot on the ground, pt fell out his wheelchair mechanically. Pt on blood thinner- plavix. Pt denied LOC or hit his head, however pt has hx of dementia . No complaints at this time.  ?

## 2021-12-03 NOTE — ED Provider Notes (Signed)
?[facility] ?Provider Note ? ? ?CSN: [id number] ?Arrival date & time: 12/03/21  1659 ? ?  ? ?History ?Chief Complaint  ?Patient presents with  ? Fall  ? ? ?Jonathan BROUGHTON Sr. is a 71 y.o. male with history of end-stage renal disease on dialysis, hep C, diabetes, dementia and CHF presents to the ED for evaluation of an unwitnessed fall out of his wheelchair after dialysis today. The patient was waiting in a room after completing his dialysis session today for the transportation Lucianne Lei to bring him back home. Staff found him on the floor near his wheelchair. The patient has dementia at baseline line, so history is limited. 78 of history obtained from wife at bedside and EMS note. The patient is on blood thinners and it is unknown if the patient hit his head, although he denies it. Wife at bedside reports he is at his baseline and is acting normally. The wife reports he does have falls out of his wheelchair as he sometime tries to walk although he has a BKA and AKA and falls.  ? ?Fall ? ? ?  ? ?Home Medications ?Prior to Admission medications   ?Medication Sig Start Date End Date Taking? Authorizing Provider  ?acetaminophen (TYLENOL) 325 MG tablet Take 2 tablets (650 mg total) by mouth every 6 (six) hours as needed for moderate pain. 08/28/21 08/28/22  Florencia Reasons, MD  ?acidophilus (RISAQUAD) CAPS capsule Take 2 capsules by mouth 3 (three) times daily. 06/23/21   Love, Ivan Anchors, PA-C  ?allopurinol (ZYLOPRIM) 100 MG tablet Take 100 mg by mouth at bedtime. 06/09/14   [provider]  ?apixaban (ELIQUIS) 5 MG TABS tablet Take 1 tablet (5 mg total) by mouth 2 (two) times daily. 06/23/21   Bary Leriche, PA-C  ?aspirin 81 MG chewable tablet Chew 81 mg by mouth daily.  06/09/14   [provider]  ?atorvastatin (LIPITOR) 40 MG tablet Take 40 mg by mouth at bedtime.    [provider]  ?b complex-vitamin c-folic acid (NEPHRO-VITE) 0.8 MG TABS tablet Take 1 tablet by  mouth daily. 05/08/20   [provider]  ?famotidine (PEPCID) 20 MG tablet TAKE 1 TABLET(20 MG) BY MOUTH AT BEDTIME 10/26/21   Noralyn Pick, NP  ?feeding supplement (BOOST HIGH PROTEIN) LIQD Take 1 Container by mouth daily as needed (nutrition).    [provider]  ?folic acid (FOLVITE) 1 MG tablet Take 1 tablet (1 mg total) by mouth daily. 05/11/21   Garvin Fila, MD  ?hydrocortisone (ANUSOL-HC) 25 MG suppository Place 1 suppository (25 mg total) rectally 2 (two) times daily. 06/23/21   Love, Ivan Anchors, PA-C  ?insulin aspart (NOVOLOG) 100 UNIT/ML injection Insulin sliding scale: ?Blood sugar  120-150   3units ?                      151-200   4units ?                      201-250   7units ?                      251- 300  11units ?                      301-350   15uints ?  351-400   20units ?                      >400         call MD immediately 08/28/21   Florencia Reasons, MD  ?Insulin Pen Needle 32G X 4 MM MISC 1 each by Other route as needed (insulin).  09/10/16   [provider]  ?Inulin (FIBER CHOICE PO) Take 1 tablet by mouth daily.    [provider]  ?levalbuterol Penne Lash HFA) 45 MCG/ACT inhaler Inhale 2 puffs into the lungs every 8 (eight) hours as needed for wheezing. 09/21/19   Raiford Noble Latif, DO  ?lidocaine-prilocaine (EMLA) cream Apply 1 application topically 3 (three) times a week. 30 minutes prior to Dialysis - MWF 05/08/18   [provider]  ?melatonin 5 MG TABS Take 1 tablet (5 mg total) by mouth at bedtime. ?Patient taking differently: Take 5 mg by mouth at bedtime as needed (sleep). 06/23/21   Bary Leriche, PA-C  ?Nystatin (GERHARDT'S BUTT CREAM) CREA Apply 1 application topically 4 (four) times daily. Mix desitin with antifugal cream and apply a layer to buttocks/scrotum. 06/25/21   Love, Ivan Anchors, PA-C  ?nystatin (MYCOSTATIN/NYSTOP) powder Apply topically 3 (three) times daily. 06/23/21   Love, Ivan Anchors, PA-C  ?nystatin cream  (MYCOSTATIN) Apply topically 2 (two) times daily. 06/23/21   Love, Ivan Anchors, PA-C  ?polyethylene glycol (MIRALAX / GLYCOLAX) 17 g packet Take 17 g by mouth daily as needed for mild constipation. 06/10/21   Terrilee Croak, MD  ?predniSONE (DELTASONE) 5 MG tablet Take 5 mg by mouth daily with breakfast. 03/02/20   [provider]  ?sevelamer carbonate (RENVELA) 800 MG tablet Take 1 tablet (800 mg total) by mouth 3 (three) times daily with meals. 06/25/21   Bary Leriche, PA-C  ?witch hazel-glycerin (TUCKS) pad Apply topically as needed for itching. 06/25/21   Bary Leriche, PA-C  ?   ? ?Allergies    ?Patient has no known allergies.   ? ?Review of Systems   ?Review of Systems  ?Unable to perform ROS: Dementia  ? ?Physical Exam ?Updated Vital Signs ?BP 132/73 (BP Location: Right Arm)   Pulse 78   Temp (!) 97.4 ?F (36.3 ?C) (Oral)   Resp 16   SpO2 94%  ?Physical Exam ?Vitals and nursing note reviewed.  ?Constitutional:   ?   General: He is not in acute distress. ?   Appearance: He is not ill-appearing or toxic-appearing.  ?   Comments: Pleasantly demented.   ?HENT:  ?   Head: Normocephalic and atraumatic.  ?   Comments: No signs of trauma or injury noted to the face or scalp. No step offs or deformities. Non tender to palpation.  ?Eyes:  ?   Pupils: Pupils are equal, round, and reactive to light.  ?Neck:  ?   Comments: No midline or paraspinal tenderness to palpation. No overlying warmth or erythema. No overlying skin changes noted, no signs of trauma. The patient has full ROM.  ?Cardiovascular:  ?   Rate and Rhythm: Normal rate.  ?Pulmonary:  ?   Effort: Pulmonary effort is normal. No respiratory distress.  ?   Breath sounds: Normal breath sounds.  ?Abdominal:  ?   General: Bowel sounds are normal.  ?   Palpations: Abdomen is soft.  ?   Tenderness: There is no abdominal tenderness. There is no guarding or rebound.  ?Musculoskeletal:     ?  General: No tenderness or signs of injury.  ?   Cervical back:  Normal range of motion. No tenderness.  ?   Comments: AKA on the left. BKA on the right. He has no extremity tenderness to palpation. No midline or paraspinal tenderness to palpation. No tenderness over the gluteus or lateral hips. No chest tenderness to palpation. No signs of acute trauma or injury.   ?Skin: ?   Comments: Patient has a thickened area at the top of his gluteal cleft with bony prominence.  Likely from patient remaining sitting for 19 out of times.  There is no wound or fluctuance or induration noted.  No increased warmth or red streaking noted.  ?Neurological:  ?   Mental Status: He is alert. Mental status is at baseline.  ?   Comments: Alert and oriented x 1 which is consistent with the patient's baseline per wife in room. He is moving all extremities spontaneously. Neurological exam limited to patient's dementia and cooperation.   ? ? ?ED Results / Procedures / Treatments   ?Labs ?(all labs ordered are listed, but only abnormal results are displayed) ?Labs Reviewed  ?CBC WITH DIFFERENTIAL/PLATELET - Abnormal; Notable for the following components:  ?    Result Value  ? WBC 3.5 (*)   ? RBC 3.92 (*)   ? Hemoglobin 12.4 (*)   ? HCT 37.8 (*)   ? RDW 17.9 (*)   ? All other components within normal limits  ?COMPREHENSIVE METABOLIC PANEL - Abnormal; Notable for the following components:  ? Chloride 96 (*)   ? CO2 33 (*)   ? Creatinine, Ser 1.94 (*)   ? Calcium 8.7 (*)   ? Albumin 3.4 (*)   ? GFR, Estimated 37 (*)   ? All other components within normal limits  ? ? ?EKG ?None ? ?Radiology ?DG Pelvis 1-2 Views ? ?Result Date: 12/03/2021 ?CLINICAL DATA:  Fall EXAM: PELVIS - 1-2 VIEW COMPARISON:  06/03/2021 FINDINGS: Osseous demineralization. Interval LEFT hip arthroplasty, tip of femoral component not imaged. Narrowing of RIGHT hip joint. No acute fracture, dislocation, or bone destruction. Extensive atherosclerotic calcifications in the prob is and proximal thighs with vascular stent at RIGHT external iliac  artery. IMPRESSION: Osseous demineralization with interval LEFT hip arthroplasty. No acute bony findings. Electronically Signed   By: Lavonia Dana M.D.   On: 12/03/2021 18:40  ? ?CT Head Wo Contrast ? ?Result Da

## 2021-12-28 IMAGING — DX DG FOOT COMPLETE 3+V*R*
3 series · 3 of 3 positions shown · non-contrast
Comparison: None.

CLINICAL DATA: Foot ulcer, osteomyelitis

EXAM:
RIGHT FOOT COMPLETE - 3+ VIEW

[foot ap]
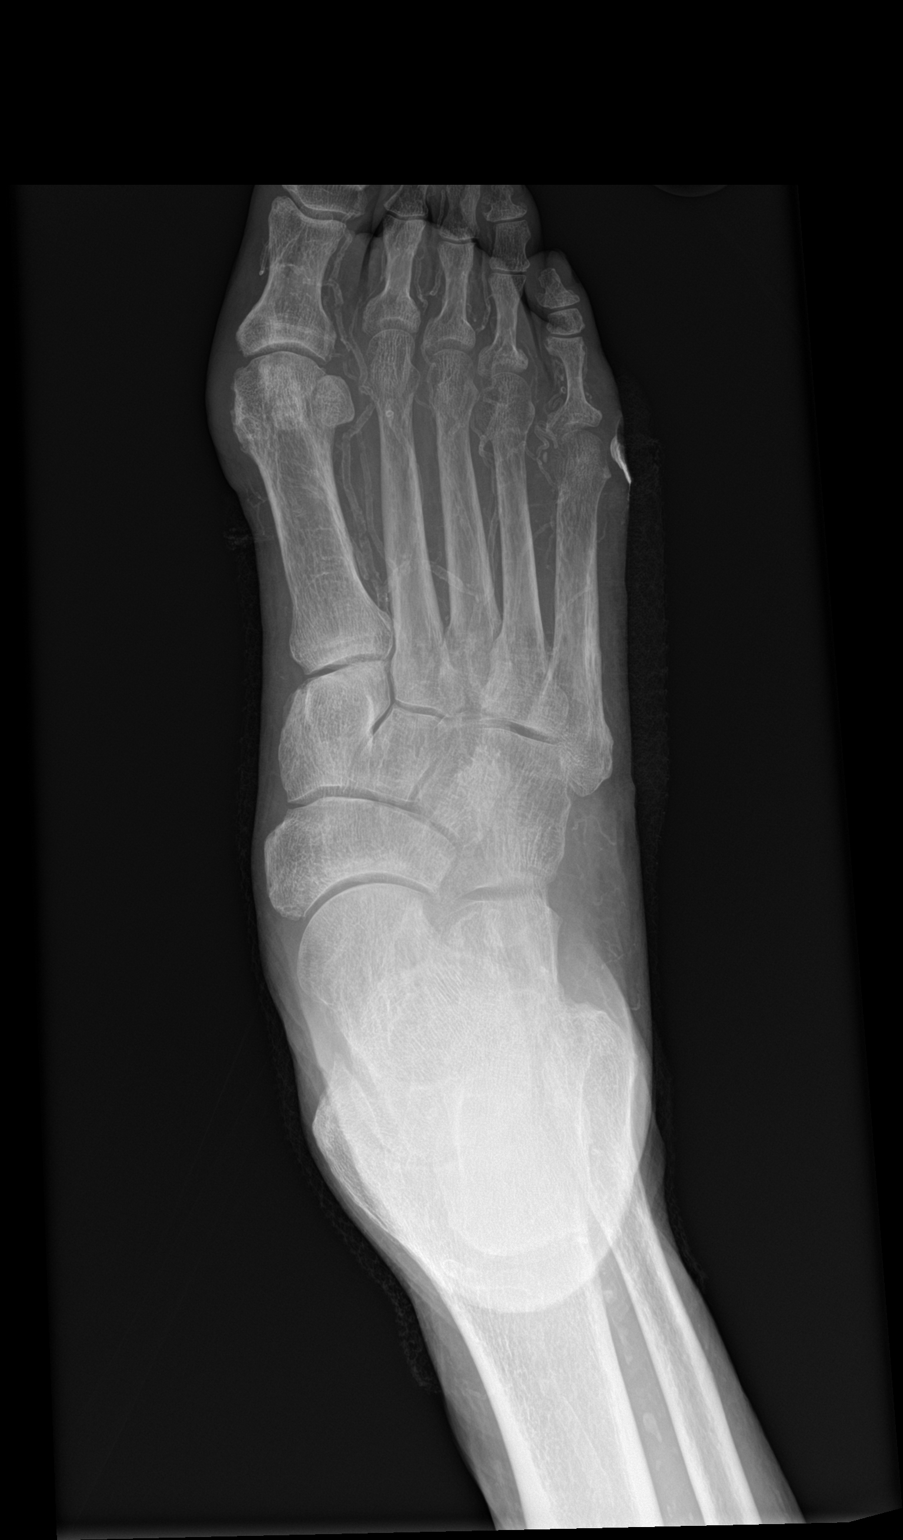

[foot obl]
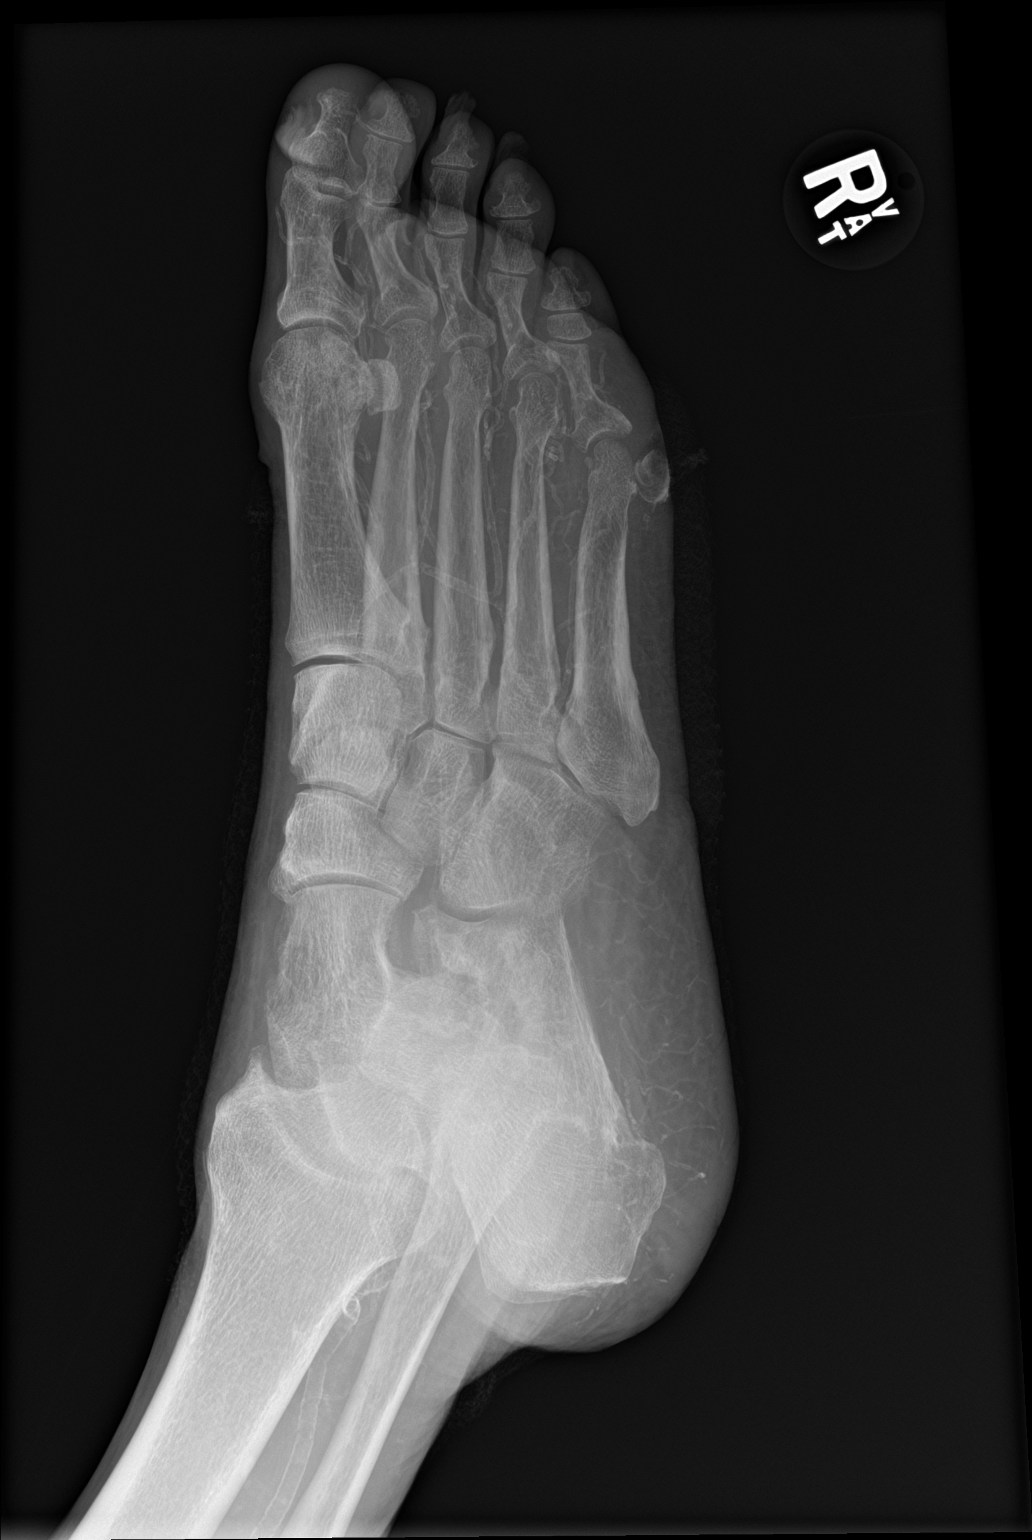

[foot lat]
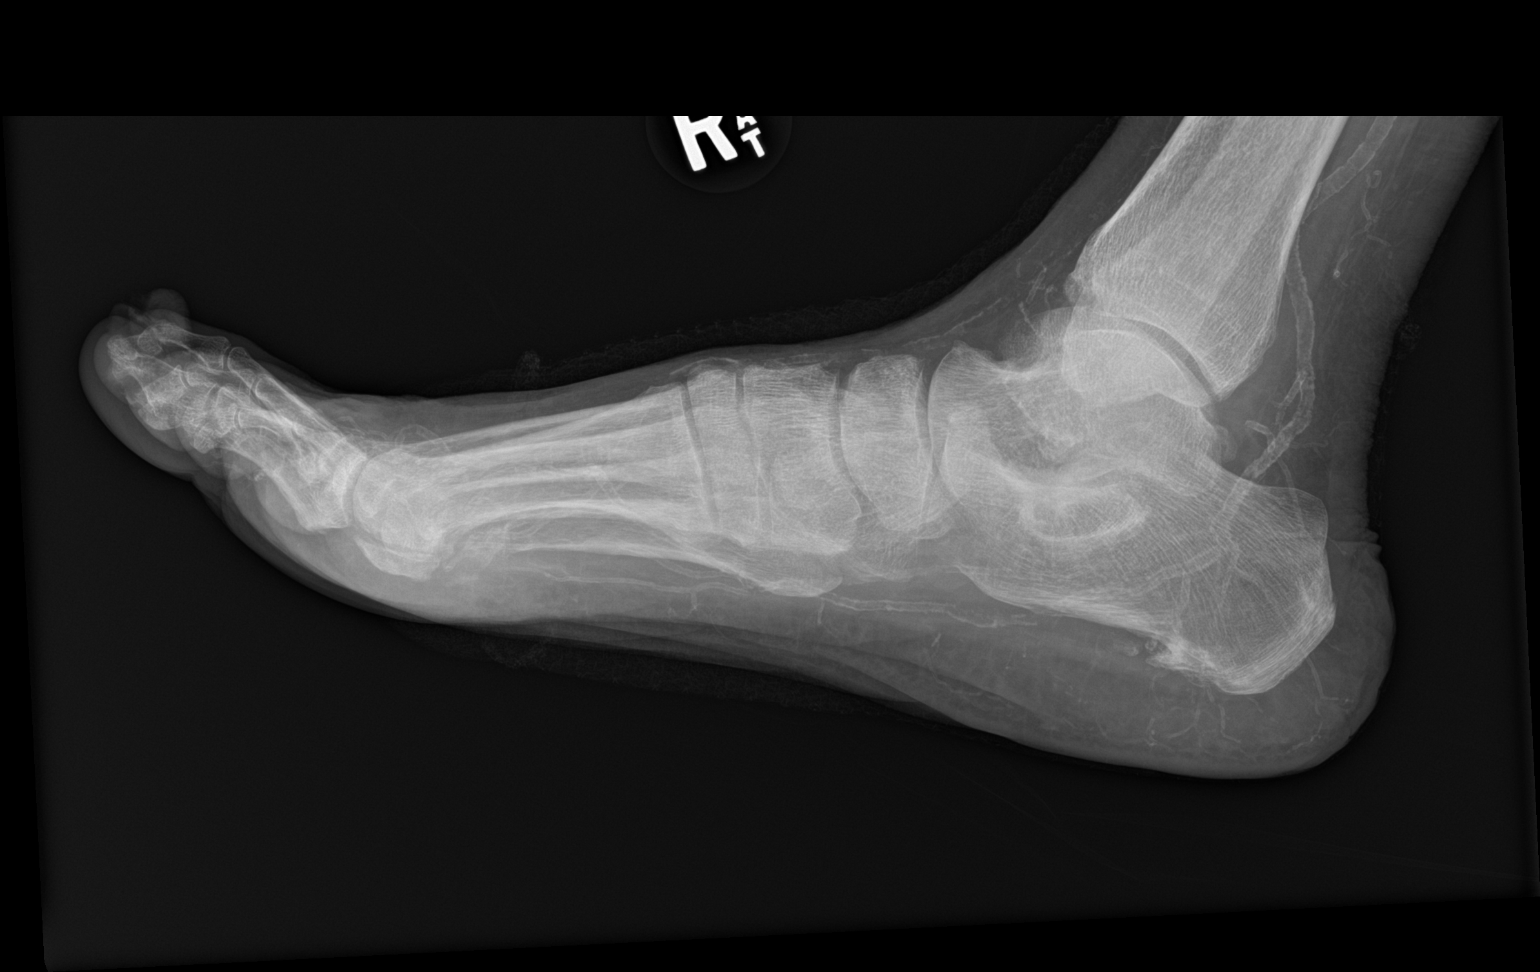

[3 of 3 positions shown; findings below may reference images not displayed]

FINDINGS: Soft tissue defect adjacent to the head of the fifth metatarsal.
There is mild diffuse osteopenia but increased demineralization
laterally in the fifth metatarsal head suggesting osteomyelitis. No
definite subchondral erosion. No fracture or dislocation. Extensive
vascular calcifications. Calcaneal spur.
IMPRESSION: 1. Lateral demineralization in the fifth metatarsal head suggesting
osteomyelitis.
2. Soft tissue defect adjacent to the head of the fifth metatarsal.

## 2022-01-28 NOTE — Therapy (Deleted)
OUTPATIENT SPEECH LANGUAGE PATHOLOGY SWALLOW EVALUATION   Patient Name: Jonathan RYLAND Sr. MRN: [id number] DOB:[birth year], 71 y.o., male Today's Date: 01/28/2022  PCP: Ginger Organ., MD  REFERRING PROVIDER: Ginger Organ., MD    Past Medical History:  Diagnosis Date   CAD (coronary artery disease)    CHF (congestive heart failure) ([place])    Diabetes mellitus without complication ([place])    type 2   DM (diabetes mellitus) ([place])    ESRD (end stage renal disease) ([place])    dialysis Mon Wed Fri   FUO (fever of unknown origin) 09/17/2019   History of blood transfusion    Hyperlipidemia    Hypertension    Osteomyelitis ([name])    Peripheral arterial disease ([name])    PVD (peripheral vascular disease) ([place])    Renal disorder 2015   right kidney transplant   Stroke [facility])    Vascular dementia [facility])    Past Surgical History:  Procedure Laterality Date   ABDOMINAL AORTOGRAM N/A 08/05/2020   Procedure: ABDOMINAL AORTOGRAM;  Surgeon: [name], MD;  Location: [place] CV LAB;  Service: Cardiovascular;  Laterality: N/A;   ABDOMINAL AORTOGRAM W/LOWER EXTREMITY N/A 09/09/2020   Procedure: ABDOMINAL AORTOGRAM W/LOWER EXTREMITY;  Surgeon: [name], MD;  Location: [place] [facility] LAB;  Service: Cardiovascular;  Laterality: N/A;   AMPUTATION Right 08/06/2020   Procedure: Right Fifth Toe Ray Amputation;  Surgeon: [name], MD;  Location: [place];  Service: Vascular;  Laterality: Right;   AMPUTATION Right 11/02/2020   Procedure: RIGHT AMPUTATION BELOW KNEE;  Surgeon: [name], MD;  Location: [place];  Service: Vascular;  Laterality: Right;   AMPUTATION Left 08/19/2021   Procedure: LEFT ABOVE KNEE AMPUTATION;  Surgeon: [name], MD;  Location: [place];  Service: Vascular;  Laterality: Left;   APPLICATION OF WOUND VAC Left 08/19/2021   Procedure: APPLICATION OF WOUND VAC;  Surgeon: [name], MD;  Location: [place];  Service: Vascular;  Laterality:  Left;   BACK SURGERY     Has had 2 back surgeries   BUBBLE STUDY  09/18/2019   Procedure: BUBBLE STUDY;  Surgeon: Buford Dresser, MD;  Location: [place];  Service: Cardiovascular;;   Depression     ENDARTERECTOMY FEMORAL Right 08/06/2020   Procedure: Right Ilio- Femoral Artery Endarterectomy;  Surgeon: [name], MD;  Location: [facility] OR;  Service: Vascular;  Laterality: Right;   HAND SURGERY Right    HAND SURGERY     HIP ARTHROPLASTY Left 06/04/2021   Procedure: ARTHROPLASTY BIPOLAR HIP (HEMIARTHROPLASTY);  Surgeon: Willaim Sheng, MD;  Location: [place];  Service: Orthopedics;  Laterality: Left;   IR DIALY SHUNT INTRO NEEDLE/INTRACATH INITIAL W/IMG RIGHT Right 06/07/2021   IR REMOVAL TUN CV CATH W/O FL  09/03/2019   KIDNEY TRANSPLANT     November 2015   LEG AMPUTATION BELOW KNEE Right    LOWER EXTREMITY ANGIOGRAPHY Bilateral 08/05/2020   Procedure: LOWER EXTREMITY ANGIOGRAPHY;  Surgeon: [name], MD;  Location: [place] CV LAB;  Service: Cardiovascular;  Laterality: Bilateral;   Memory loss     NEPHRECTOMY TRANSPLANTED ORGAN     PATCH ANGIOPLASTY Right 08/06/2020   Procedure: PATCH ANGIOPLASTY;  Surgeon: [name], MD;  [facility] OR;  Service: Vascular;  Laterality: Right;   PERIPHERAL VASCULAR BALLOON ANGIOPLASTY Right 09/09/2020   Procedure: PERIPHERAL VASCULAR BALLOON ANGIOPLASTY;  Surgeon: [name], MD;  Location: [place] CV LAB;  Service: Cardiovascular;  Laterality: Right;  popliteal   PERIPHERAL VASCULAR INTERVENTION Right 08/05/2020   Procedure: PERIPHERAL VASCULAR INTERVENTION;  Surgeon: [name], MD;  Location: [place] CV LAB;  Service: Cardiovascular;  Laterality: Right;  SFA   PERIPHERAL VASCULAR INTERVENTION Right 09/09/2020   Procedure: PERIPHERAL VASCULAR INTERVENTION;  Surgeon: [name], MD;  Location: [place] CV LAB;  Service: Cardiovascular;  Laterality: Right;  external iliac   TEE WITHOUT  CARDIOVERSION N/A 09/18/2019   Procedure: TRANSESOPHAGEAL ECHOCARDIOGRAM (TEE);  Surgeon: Buford Dresser, MD;  Location: [facility] ENDOSCOPY;  Service: Cardiovascular;  Laterality: N/A;   WOUND DEBRIDEMENT Right 10/08/2020   Procedure: DEBRIDEMENT WOUND RIGHT FOOT;  Surgeon: [name], MD;  Location: [place];  Service: Vascular;  Laterality: Right;   Patient Active Problem List   Diagnosis Date Noted   Protein-calorie malnutrition, severe 08/26/2021   PVD (peripheral vascular disease) ([place]) 08/19/2021   S/P AKA (above knee amputation) unilateral, left ([place]) 08/19/2021   Constipation 07/01/2021   Disrupted sleep-wake cycle 07/01/2021   Post-op pain 07/01/2021   Rectal bleeding    Closed left subtrochanteric femur fracture, sequela 06/10/2021   Hx of right BKA ([place]) 06/10/2021   Fall    CHF (congestive heart failure) ([place]) 06/04/2021   Hip fracture ([place]) 06/03/2021   Osteomyelitis ([place]) 10/30/2020   Encounter for general adult medical examination without abnormal findings 09/15/2020   Hemiplegia of dominant side ([place]) 09/15/2020   History of CVA (cerebrovascular accident) 09/15/2020   History of COVID-19 09/15/2020   Hypercoagulable state ([place]) 09/15/2020   Other chronic osteomyelitis, right ankle and foot ([place]) 09/15/2020   Right flank pain 09/15/2020   Atherosclerosis of native arteries of the extremities with ulceration ([place]) 08/05/2020   Encounter for removal of sutures 07/27/2020   Neuropathic ulcer of foot, unspecified laterality, limited to breakdown of skin ([place]) 06/09/2020   Anemia 01/16/2020   Headache, unspecified 12/03/2019   Other disorders of phosphorus metabolism 10/25/2019   Unspecified protein-calorie malnutrition ([place]) 09/27/2019   FUO (fever of unknown origin) 09/17/2019   Cerebral thrombosis with cerebral infarction 08/02/2019   Cerebral embolism with cerebral infarction 08/02/2019   Acute respiratory failure due to COVID-19 [facility]) 07/30/2019   Acute  respiratory failure with hypoxia ([place]) 07/30/2019   Allergy, unspecified, initial encounter 40/97/3532   Chronic systolic HF (heart failure) ([place]) 02/25/2019   ESRD (end stage renal disease) on dialysis ([place]) 02/25/2019   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 kidney disease, or end stage renal disease ([place]) 12/04/2018   Hypoglycemia, unspecified 08/17/2018   Generalized abdominal pain 07/16/2018   Hypercalcemia 06/28/2018   Arteriovenous fistula for hemodialysis in place, primary ([place]) 05/15/2018   Failed kidney transplant 05/15/2018   Anemia in chronic kidney disease 05/13/2018   Idiopathic gout, unspecified site 99/24/2683   Complication of vascular dialysis catheter 01/16/2018   Other specified coagulation defects ([place]) 01/16/2018   Pain, unspecified 01/16/2018   Pruritus, unspecified 01/16/2018   Secondary hyperparathyroidism of renal origin ([place]) 01/16/2018   Shortness of breath 01/16/2018   Type 2 diabetes mellitus with diabetic peripheral angiopathy without gangrene ([place]) 01/16/2018   Abnormal gait 01/10/2018   Dependence on renal dialysis ([place]) 01/10/2018   Iron deficiency anemia, unspecified 12/21/2017   Acute blood loss anemia 12/14/2017   Hypomagnesemia 12/03/2017   Anticoagulated 12/02/2017   Stroke ([place]) 11/29/2017   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 11/28/2017   History of MI (myocardial infarction)  11/28/2017   Hypertension 11/28/2017   Chronic hepatitis C without hepatic coma ([place]) 09/25/2017   Diabetes mellitus type 2, uncomplicated ([place]) 52/77/8242   Myocardial infarction ([place]) 09/25/2017   Renal transplant recipient 09/25/2017   Transfusion history 09/25/2017   Vascular dementia without behavioral disturbance ([name]) 03/10/2017   History of coronary artery disease 09/12/2016   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 MI ([place]) 09/12/2016   Acute coronary thrombosis not resulting in  myocardial infarction [facility]) 09/12/2016   Aftercare following organ transplant 09/11/2016   Mineral metabolism disorder 09/11/2016   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  Rationale for Evaluation and Treatment Rehabilitation  SUBJECTIVE:   SUBJECTIVE STATEMENT: *** Pt accompanied by: {accompnied:27141}  PERTINENT HISTORY: Reports choking with food and fluid intake no matter if sitting or lying down. Lungs are CTAb without rales or rhonchi. Not hypoxic or febrile. Unable to obtain CXR today due to inability to ambulate. Reviewed CBC from 01/19/2022, no leukocytosis. Unable to obtain CBC today. Based on exam, no abnormal breathsounds to suggest aspiration PNA; therefore, abx not warranted. Certainly at RF asp 2/2 debilitated state due weakness, stroke and vascular dementia, pt may be too weak to clear OP secretions. MBSS and ST evaluation pending. Discussed ensuring pt is upright for PO intake and remain elevated. Recommended thickened liquids until further evaluation with MBSS.   PAIN:  Are you having pain? {OPRCPAIN:27236}  FALLS: Has patient fallen in last 6 months?  {PNTIRWER:15400}  LIVING ENVIRONMENT: Lives with: {OPRC lives with:25569::"lives with their family"} Lives in: {Lives in:25570}  PLOF:  Level of assistance: {QQPYPPJ:09326} Employment: {SLPemployment:25674}   PATIENT GOALS ***  OBJECTIVE:   DIAGNOSTIC FINDINGS: ***  RECOMMENDATIONS FROM OBJECTIVE SWALLOW STUDY (MBSS/FEES):  *** Objective swallow impairments: *** Objective recommended compensations:  ***  COGNITION: Overall cognitive status: {cognition:24006} Areas of impairment:  {cognitiveimpairmentslp:27409} Functional deficits: ***  ORAL MOTOR EXAMINATION Overall status: {OMESLP2:27645} Comments: ***  CLINICAL SWALLOW ASSESSMENT:   Current diet: {slpdiet:27196} Dentition: {dentition:27197} Patient directly observed with POs: {POobserved:27199} Feeding: {slp feeding:27200} Liquids provided by: {SLPliquids:27201} Oral phase signs and symptoms: {SLPoralphase:27202} Pharyngeal phase signs and symptoms: {SLPpharyngealphase:27203}    PATIENT REPORTED OUTCOME MEASURES (PROM): {SLPPROM:27095}   TODAY'S TREATMENT:  ***   PATIENT EDUCATION: Education details: *** Person educated: {Person educated:25204} Education method: {Education Method:25205} Education comprehension: {Education Comprehension:25206}   ASSESSMENT:  CLINICAL IMPRESSION: Patient is a *** y.o. *** who was seen today for ***.   OBJECTIVE IMPAIRMENTS include {SLPOBJIMP:27107}. These impairments are limiting patient from {SLPLIMIT:27108}. Factors affecting potential to achieve goals and functional outcome are {SLP potential:25450}. Patient will benefit from skilled SLP services to address above impairments and improve overall function.  REHAB POTENTIAL: {rehabpotential:25112}   GOALS: Goals reviewed with patient? {yes/no:20286}  SHORT TERM GOALS: Target date: {follow up:25551}  (Remove Blue Hyperlink)  *** Baseline: Goal status: {GOALSTATUS:25110}  2.  *** Baseline:  Goal status: {GOALSTATUS:25110}  3.  *** Baseline:  Goal status: {GOALSTATUS:25110}  4.  *** Baseline:  Goal status: {GOALSTATUS:25110}  5.  *** Baseline:  Goal status: {GOALSTATUS:25110}  6.  *** Baseline:  Goal status: {GOALSTATUS:25110}  LONG TERM GOALS:  Target date: {follow up:25551}  (Remove Blue Hyperlink)  *** Baseline:  Goal status: {GOALSTATUS:25110}  2.  *** Baseline:  Goal status:  {GOALSTATUS:25110}  3.  *** Baseline:  Goal status: {GOALSTATUS:25110}  4.  *** Baseline:  Goal status: {GOALSTATUS:25110}  5.  *** Baseline:  Goal status: {GOALSTATUS:25110}  6.  *** Baseline:  Goal status: {GOALSTATUS:25110}  PLAN: SLP FREQUENCY: {rehab frequency:25116}  SLP DURATION: {rehab duration:25117}  PLANNED INTERVENTIONS: {SLP treatment/interventions:25449}    Su Monks, CCC-SLP 01/28/2022, 3:33 PM

## 2022-01-31 ENCOUNTER — Telehealth (HOSPITAL_COMMUNITY): Payer: Self-pay

## 2022-02-01 ENCOUNTER — Ambulatory Visit: Payer: Medicare Other | Attending: Speech Pathology | Admitting: Speech Pathology

## 2022-02-04 ENCOUNTER — Encounter (HOSPITAL_COMMUNITY): Payer: Self-pay

## 2022-02-04 ENCOUNTER — Emergency Department (HOSPITAL_COMMUNITY): Payer: Medicare Other

## 2022-02-04 ENCOUNTER — Inpatient Hospital Stay (HOSPITAL_COMMUNITY)
Admission: EM | Admit: 2022-02-04 | Discharge: 2022-03-08 | DRG: 064 | Disposition: E | Payer: Medicare Other | Attending: Family Medicine | Admitting: Family Medicine

## 2022-02-04 ENCOUNTER — Other Ambulatory Visit: Payer: Self-pay

## 2022-02-04 DIAGNOSIS — Z20822 Contact with and (suspected) exposure to covid-19: Secondary | ICD-10-CM | POA: Diagnosis present

## 2022-02-04 DIAGNOSIS — Z7901 Long term (current) use of anticoagulants: Secondary | ICD-10-CM

## 2022-02-04 DIAGNOSIS — Z89611 Acquired absence of right leg above knee: Secondary | ICD-10-CM

## 2022-02-04 DIAGNOSIS — E785 Hyperlipidemia, unspecified: Secondary | ICD-10-CM | POA: Diagnosis present

## 2022-02-04 DIAGNOSIS — E1122 Type 2 diabetes mellitus with diabetic chronic kidney disease: Secondary | ICD-10-CM | POA: Diagnosis present

## 2022-02-04 DIAGNOSIS — Z66 Do not resuscitate: Secondary | ICD-10-CM | POA: Diagnosis present

## 2022-02-04 DIAGNOSIS — G9340 Encephalopathy, unspecified: Secondary | ICD-10-CM | POA: Diagnosis not present

## 2022-02-04 DIAGNOSIS — N186 End stage renal disease: Secondary | ICD-10-CM | POA: Diagnosis present

## 2022-02-04 DIAGNOSIS — R4182 Altered mental status, unspecified: Secondary | ICD-10-CM | POA: Diagnosis not present

## 2022-02-04 DIAGNOSIS — E11649 Type 2 diabetes mellitus with hypoglycemia without coma: Secondary | ICD-10-CM | POA: Diagnosis present

## 2022-02-04 DIAGNOSIS — I132 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 kidney disease, or end stage renal disease: Secondary | ICD-10-CM | POA: Diagnosis present

## 2022-02-04 DIAGNOSIS — I6340 Cerebral infarction due to embolism of unspecified cerebral artery: Secondary | ICD-10-CM | POA: Diagnosis present

## 2022-02-04 DIAGNOSIS — I513 Intracardiac thrombosis, not elsewhere classified: Secondary | ICD-10-CM | POA: Diagnosis present

## 2022-02-04 DIAGNOSIS — Z79899 Other long term (current) drug therapy: Secondary | ICD-10-CM

## 2022-02-04 DIAGNOSIS — E875 Hyperkalemia: Secondary | ICD-10-CM | POA: Diagnosis present

## 2022-02-04 DIAGNOSIS — F0150 Vascular dementia without behavioral disturbance: Secondary | ICD-10-CM | POA: Diagnosis present

## 2022-02-04 DIAGNOSIS — Z992 Dependence on renal dialysis: Secondary | ICD-10-CM

## 2022-02-04 DIAGNOSIS — I69319 Unspecified symptoms and signs involving cognitive functions following cerebral infarction: Secondary | ICD-10-CM

## 2022-02-04 DIAGNOSIS — R29721 NIHSS score 21: Secondary | ICD-10-CM | POA: Diagnosis present

## 2022-02-04 DIAGNOSIS — Z7952 Long term (current) use of systemic steroids: Secondary | ICD-10-CM

## 2022-02-04 DIAGNOSIS — G9341 Metabolic encephalopathy: Secondary | ICD-10-CM | POA: Diagnosis present

## 2022-02-04 DIAGNOSIS — I69320 Aphasia following cerebral infarction: Secondary | ICD-10-CM | POA: Diagnosis not present

## 2022-02-04 DIAGNOSIS — M869 Osteomyelitis, unspecified: Secondary | ICD-10-CM | POA: Diagnosis present

## 2022-02-04 DIAGNOSIS — Z96642 Presence of left artificial hip joint: Secondary | ICD-10-CM | POA: Diagnosis present

## 2022-02-04 DIAGNOSIS — Z794 Long term (current) use of insulin: Secondary | ICD-10-CM

## 2022-02-04 DIAGNOSIS — Y830 Surgical operation with transplant of whole organ as the cause of abnormal reaction of the patient, or of later complication, without mention of misadventure at the time of the procedure: Secondary | ICD-10-CM | POA: Diagnosis present

## 2022-02-04 DIAGNOSIS — Z91158 Patient's noncompliance with renal dialysis for other reason: Secondary | ICD-10-CM

## 2022-02-04 DIAGNOSIS — Z993 Dependence on wheelchair: Secondary | ICD-10-CM

## 2022-02-04 DIAGNOSIS — Z87891 Personal history of nicotine dependence: Secondary | ICD-10-CM

## 2022-02-04 DIAGNOSIS — T8612 Kidney transplant failure: Secondary | ICD-10-CM | POA: Diagnosis present

## 2022-02-04 DIAGNOSIS — I509 Heart failure, unspecified: Secondary | ICD-10-CM

## 2022-02-04 DIAGNOSIS — Z7189 Other specified counseling: Secondary | ICD-10-CM | POA: Diagnosis not present

## 2022-02-04 DIAGNOSIS — R197 Diarrhea, unspecified: Secondary | ICD-10-CM | POA: Diagnosis present

## 2022-02-04 DIAGNOSIS — Z800 Family history of malignant neoplasm of digestive organs: Secondary | ICD-10-CM

## 2022-02-04 DIAGNOSIS — Z7982 Long term (current) use of aspirin: Secondary | ICD-10-CM

## 2022-02-04 DIAGNOSIS — Z89612 Acquired absence of left leg above knee: Secondary | ICD-10-CM

## 2022-02-04 DIAGNOSIS — E119 Type 2 diabetes mellitus without complications: Secondary | ICD-10-CM

## 2022-02-04 DIAGNOSIS — I69311 Memory deficit following cerebral infarction: Secondary | ICD-10-CM

## 2022-02-04 DIAGNOSIS — Z833 Family history of diabetes mellitus: Secondary | ICD-10-CM

## 2022-02-04 DIAGNOSIS — R29810 Facial weakness: Secondary | ICD-10-CM | POA: Diagnosis present

## 2022-02-04 DIAGNOSIS — L89151 Pressure ulcer of sacral region, stage 1: Secondary | ICD-10-CM | POA: Diagnosis present

## 2022-02-04 DIAGNOSIS — I5043 Acute on chronic combined systolic (congestive) and diastolic (congestive) heart failure: Secondary | ICD-10-CM | POA: Diagnosis present

## 2022-02-04 DIAGNOSIS — Z515 Encounter for palliative care: Secondary | ICD-10-CM

## 2022-02-04 DIAGNOSIS — D631 Anemia in chronic kidney disease: Secondary | ICD-10-CM | POA: Diagnosis present

## 2022-02-04 DIAGNOSIS — Z7401 Bed confinement status: Secondary | ICD-10-CM

## 2022-02-04 DIAGNOSIS — S88119A Complete traumatic amputation at level between knee and ankle, unspecified lower leg, initial encounter: Secondary | ICD-10-CM

## 2022-02-04 DIAGNOSIS — E1169 Type 2 diabetes mellitus with other specified complication: Secondary | ICD-10-CM | POA: Diagnosis present

## 2022-02-04 DIAGNOSIS — E860 Dehydration: Secondary | ICD-10-CM | POA: Diagnosis present

## 2022-02-04 DIAGNOSIS — G9349 Other encephalopathy: Secondary | ICD-10-CM | POA: Diagnosis present

## 2022-02-04 DIAGNOSIS — R1310 Dysphagia, unspecified: Secondary | ICD-10-CM | POA: Diagnosis present

## 2022-02-04 DIAGNOSIS — I6389 Other cerebral infarction: Secondary | ICD-10-CM | POA: Diagnosis not present

## 2022-02-04 DIAGNOSIS — I639 Cerebral infarction, unspecified: Secondary | ICD-10-CM

## 2022-02-04 DIAGNOSIS — E162 Hypoglycemia, unspecified: Secondary | ICD-10-CM

## 2022-02-04 DIAGNOSIS — Z89619 Acquired absence of unspecified leg above knee: Secondary | ICD-10-CM

## 2022-02-04 DIAGNOSIS — E1165 Type 2 diabetes mellitus with hyperglycemia: Secondary | ICD-10-CM | POA: Diagnosis present

## 2022-02-04 DIAGNOSIS — I2510 Atherosclerotic heart disease of native coronary artery without angina pectoris: Secondary | ICD-10-CM | POA: Diagnosis present

## 2022-02-04 DIAGNOSIS — E1151 Type 2 diabetes mellitus with diabetic peripheral angiopathy without gangrene: Secondary | ICD-10-CM | POA: Diagnosis present

## 2022-02-04 LAB — DIFFERENTIAL
Abs Immature Granulocytes: 0.03 10*3/uL (ref 0.00–0.07)
Basophils Absolute: 0 10*3/uL (ref 0.0–0.1)
Basophils Relative: 0 %
Eosinophils Absolute: 0 10*3/uL (ref 0.0–0.5)
Eosinophils Relative: 0 %
Immature Granulocytes: 1 %
Lymphocytes Relative: 6 %
Lymphs Abs: 0.3 10*3/uL — ABNORMAL LOW (ref 0.7–4.0)
Monocytes Absolute: 0.3 10*3/uL (ref 0.1–1.0)
Monocytes Relative: 6 %
Neutro Abs: 4.1 10*3/uL (ref 1.7–7.7)
Neutrophils Relative %: 87 %

## 2022-02-04 LAB — I-STAT CHEM 8, ED
BUN: >130 mg/dL — ABNORMAL HIGH (ref 8–23)
Calcium, Ion: 1.02 mmol/L — ABNORMAL LOW (ref 1.15–1.40)
Chloride: 96 mmol/L — ABNORMAL LOW (ref 98–111)
Creatinine, Ser: 9.5 mg/dL — ABNORMAL HIGH (ref 0.61–1.24)
Glucose, Bld: 180 mg/dL — ABNORMAL HIGH (ref 70–99)
HCT: 37 % — ABNORMAL LOW (ref 39.0–52.0)
Hemoglobin: 12.6 g/dL — ABNORMAL LOW (ref 13.0–17.0)
Potassium: 6.1 mmol/L — ABNORMAL HIGH (ref 3.5–5.1)
Sodium: 132 mmol/L — ABNORMAL LOW (ref 135–145)
TCO2: 23 mmol/L (ref 22–32)

## 2022-02-04 LAB — I-STAT VENOUS BLOOD GAS, ED
Acid-base deficit: 1 mmol/L (ref 0.0–2.0)
Bicarbonate: 24.2 mmol/L (ref 20.0–28.0)
Calcium, Ion: 1.04 mmol/L — ABNORMAL LOW (ref 1.15–1.40)
HCT: 37 % — ABNORMAL LOW (ref 39.0–52.0)
Hemoglobin: 12.6 g/dL — ABNORMAL LOW (ref 13.0–17.0)
O2 Saturation: 60 %
Potassium: 6.1 mmol/L — ABNORMAL HIGH (ref 3.5–5.1)
Sodium: 132 mmol/L — ABNORMAL LOW (ref 135–145)
TCO2: 25 mmol/L (ref 22–32)
pCO2, Ven: 39.2 mmHg — ABNORMAL LOW (ref 44–60)
pH, Ven: 7.398 (ref 7.25–7.43)
pO2, Ven: 31 mmHg — CL (ref 32–45)

## 2022-02-04 LAB — CBC
HCT: 34 % — ABNORMAL LOW (ref 39.0–52.0)
Hemoglobin: 11.6 g/dL — ABNORMAL LOW (ref 13.0–17.0)
MCH: 35.6 pg — ABNORMAL HIGH (ref 26.0–34.0)
MCHC: 34.1 g/dL (ref 30.0–36.0)
MCV: 104.3 fL — ABNORMAL HIGH (ref 80.0–100.0)
Platelets: 81 10*3/uL — ABNORMAL LOW (ref 150–400)
RBC: 3.26 MIL/uL — ABNORMAL LOW (ref 4.22–5.81)
RDW: 17.6 % — ABNORMAL HIGH (ref 11.5–15.5)
WBC: 4.8 10*3/uL (ref 4.0–10.5)
nRBC: 0.8 % — ABNORMAL HIGH (ref 0.0–0.2)

## 2022-02-04 LAB — HEPATITIS B SURFACE ANTIBODY,QUALITATIVE: Hep B S Ab: NONREACTIVE

## 2022-02-04 LAB — TSH: TSH: 1.243 u[IU]/mL (ref 0.350–4.500)

## 2022-02-04 LAB — HEPATITIS B CORE ANTIBODY, TOTAL: Hep B Core Total Ab: NONREACTIVE

## 2022-02-04 LAB — PROTIME-INR
INR: 1.9 — ABNORMAL HIGH (ref 0.8–1.2)
Prothrombin Time: 21.6 seconds — ABNORMAL HIGH (ref 11.4–15.2)

## 2022-02-04 LAB — RESP PANEL BY RT-PCR (FLU A&B, COVID) ARPGX2
Influenza A by PCR: NEGATIVE
Influenza B by PCR: NEGATIVE
SARS Coronavirus 2 by RT PCR: NEGATIVE

## 2022-02-04 LAB — HEPATITIS PANEL, ACUTE
HCV Ab: REACTIVE — AB
Hep A IgM: NONREACTIVE
Hep B C IgM: NONREACTIVE
Hepatitis B Surface Ag: NONREACTIVE

## 2022-02-04 LAB — GLUCOSE, RANDOM: Glucose, Bld: 107 mg/dL — ABNORMAL HIGH (ref 70–99)

## 2022-02-04 LAB — APTT: aPTT: 62 seconds — ABNORMAL HIGH (ref 24–36)

## 2022-02-04 LAB — COMPREHENSIVE METABOLIC PANEL
ALT: 70 U/L — ABNORMAL HIGH (ref 0–44)
AST: 57 U/L — ABNORMAL HIGH (ref 15–41)
Albumin: 2.8 g/dL — ABNORMAL LOW (ref 3.5–5.0)
Alkaline Phosphatase: 172 U/L — ABNORMAL HIGH (ref 38–126)
Anion gap: 20 — ABNORMAL HIGH (ref 5–15)
BUN: 143 mg/dL — ABNORMAL HIGH (ref 8–23)
CO2: 23 mmol/L (ref 22–32)
Calcium: 8.9 mg/dL (ref 8.9–10.3)
Chloride: 94 mmol/L — ABNORMAL LOW (ref 98–111)
Creatinine, Ser: 8.46 mg/dL — ABNORMAL HIGH (ref 0.61–1.24)
GFR, Estimated: 6 mL/min — ABNORMAL LOW (ref >60)
Glucose, Bld: 191 mg/dL — ABNORMAL HIGH (ref 70–99)
Potassium: 6.4 mmol/L (ref 3.5–5.1)
Sodium: 137 mmol/L (ref 135–145)
Total Bilirubin: 1.1 mg/dL (ref 0.3–1.2)
Total Protein: 5.1 g/dL — ABNORMAL LOW (ref 6.5–8.1)

## 2022-02-04 LAB — CBG MONITORING, ED
Glucose-Capillary: 65 mg/dL — ABNORMAL LOW (ref 70–99)
Glucose-Capillary: 102 mg/dL — ABNORMAL HIGH (ref 70–99)

## 2022-02-04 LAB — HEPATITIS C ANTIBODY: HCV Ab: REACTIVE — AB

## 2022-02-04 LAB — TROPONIN I (HIGH SENSITIVITY)
Troponin I (High Sensitivity): 41 ng/L — ABNORMAL HIGH (ref <18)
Troponin I (High Sensitivity): 43 ng/L — ABNORMAL HIGH (ref <18)

## 2022-02-04 LAB — HEPATITIS B SURFACE ANTIGEN: Hepatitis B Surface Ag: NONREACTIVE

## 2022-02-04 LAB — GLUCOSE, CAPILLARY: Glucose-Capillary: 107 mg/dL — ABNORMAL HIGH (ref 70–99)

## 2022-02-04 LAB — HIV ANTIBODY (ROUTINE TESTING W REFLEX): HIV Screen 4th Generation wRfx: NONREACTIVE

## 2022-02-04 LAB — ETHANOL: Alcohol, Ethyl (B): <10 mg/dL (ref <10)

## 2022-02-04 MED ORDER — HEPARIN SODIUM (PORCINE) 1000 UNIT/ML DIALYSIS: 2000.0000 [IU] | INTRAMUSCULAR | Status: DC | PRN

## 2022-02-04 MED ORDER — ALBUTEROL SULFATE (2.5 MG/3ML) 0.083% IN NEBU
2.5000 mg | INHALATION_SOLUTION | Freq: Three times a day (TID) | RESPIRATORY_TRACT | Status: DC | PRN
Start: 2022-02-04 — End: 2022-02-07

## 2022-02-04 MED ORDER — SODIUM CHLORIDE 0.9 % IV BOLUS
500.0000 mL | Freq: Once | INTRAVENOUS | Status: AC
  Administered 2022-02-04: 500 mL via INTRAVENOUS

## 2022-02-04 MED ORDER — DEXTROSE 50 % IV SOLN
INTRAVENOUS | Status: AC
  Filled 2022-02-04: qty 50

## 2022-02-04 MED ORDER — CHLORHEXIDINE GLUCONATE CLOTH 2 % EX PADS: 6.0000 | MEDICATED_PAD | Freq: Every day | CUTANEOUS | Status: DC

## 2022-02-04 MED ORDER — INSULIN ASPART 100 UNIT/ML IV SOLN
5.0000 [IU] | Freq: Once | INTRAVENOUS | Status: AC
Start: 2022-02-04 — End: 2022-02-04
  Administered 2022-02-04: 5 [IU] via INTRAVENOUS

## 2022-02-04 MED ORDER — LEVALBUTEROL TARTRATE 45 MCG/ACT IN AERO: 2.0000 | INHALATION_SPRAY | Freq: Three times a day (TID) | RESPIRATORY_TRACT | Status: DC | PRN

## 2022-02-04 MED ORDER — INSULIN ASPART 100 UNIT/ML IJ SOLN: 0.0000 [IU] | Freq: Three times a day (TID) | INTRAMUSCULAR | Status: DC

## 2022-02-04 MED ORDER — GERHARDT'S BUTT CREAM
1.0000 | TOPICAL_CREAM | Freq: Four times a day (QID) | CUTANEOUS | Status: DC
Start: 2022-02-04 — End: 2022-02-06
  Administered 2022-02-05 – 2022-02-06 (×3): 1 via TOPICAL
  Filled 2022-02-04 (×2): qty 1

## 2022-02-04 MED ORDER — ALLOPURINOL 100 MG PO TABS: 100.0000 mg | ORAL_TABLET | Freq: Every day | ORAL | Status: DC

## 2022-02-04 MED ORDER — DOXERCALCIFEROL 4 MCG/2ML IV SOLN: 4.0000 ug | INTRAVENOUS | Status: DC

## 2022-02-04 MED ORDER — FUROSEMIDE 10 MG/ML IJ SOLN
40.0000 mg | Freq: Once | INTRAMUSCULAR | Status: AC
Start: 2022-02-04 — End: 2022-02-04
  Administered 2022-02-04: 40 mg via INTRAVENOUS
  Filled 2022-02-04: qty 4

## 2022-02-04 MED ORDER — ASPIRIN 81 MG PO CHEW
81.0000 mg | CHEWABLE_TABLET | Freq: Every day | ORAL | Status: DC
Start: 2022-02-05 — End: 2022-02-05

## 2022-02-04 MED ORDER — MELATONIN 5 MG PO TABS
5.0000 mg | ORAL_TABLET | Freq: Every evening | ORAL | Status: DC | PRN
Start: 2022-02-04 — End: 2022-02-06

## 2022-02-04 MED ORDER — APIXABAN 5 MG PO TABS: 5.0000 mg | ORAL_TABLET | Freq: Two times a day (BID) | ORAL | Status: DC

## 2022-02-04 MED ORDER — STROKE: EARLY STAGES OF RECOVERY BOOK
Freq: Once | Status: AC
Start: 2022-02-05 — End: 2022-02-05
  Filled 2022-02-04: qty 1

## 2022-02-04 MED ORDER — ACETAMINOPHEN 325 MG PO TABS
650.0000 mg | ORAL_TABLET | Freq: Four times a day (QID) | ORAL | Status: DC | PRN
Start: 2022-02-04 — End: 2022-02-07

## 2022-02-04 MED ORDER — METHYLPREDNISOLONE SODIUM SUCC 40 MG IJ SOLR
40.0000 mg | Freq: Two times a day (BID) | INTRAMUSCULAR | Status: DC
  Administered 2022-02-04 – 2022-02-05 (×3): 40 mg via INTRAVENOUS
  Filled 2022-02-04 (×3): qty 1

## 2022-02-04 MED ORDER — ATORVASTATIN CALCIUM 40 MG PO TABS: 40.0000 mg | ORAL_TABLET | Freq: Every day | ORAL | Status: DC

## 2022-02-04 MED ORDER — RISAQUAD PO CAPS
2.0000 | ORAL_CAPSULE | Freq: Three times a day (TID) | ORAL | Status: DC
  Filled 2022-02-04 (×6): qty 2

## 2022-02-04 MED ORDER — DEXTROSE 50 % IV SOLN
1.0000 | Freq: Once | INTRAVENOUS | Status: AC
Start: 2022-02-04 — End: 2022-02-04
  Administered 2022-02-04: 50 mL via INTRAVENOUS
  Filled 2022-02-04: qty 50

## 2022-02-04 MED ORDER — FAMOTIDINE 20 MG PO TABS: 20.0000 mg | ORAL_TABLET | Freq: Every day | ORAL | Status: DC

## 2022-02-04 MED ORDER — INULIN 1.5 G PO CHEW: CHEWABLE_TABLET | Freq: Every day | ORAL | Status: DC

## 2022-02-04 MED ORDER — CALCIUM GLUCONATE-NACL 1-0.675 GM/50ML-% IV SOLN
1.0000 g | Freq: Once | INTRAVENOUS | Status: AC
  Administered 2022-02-04: 1000 mg via INTRAVENOUS
  Filled 2022-02-04: qty 50

## 2022-02-04 MED ORDER — BOOST HIGH PROTEIN PO LIQD
1.0000 | Freq: Every day | ORAL | Status: DC | PRN
  Filled 2022-02-04: qty 237

## 2022-02-04 MED ORDER — LIDOCAINE-PRILOCAINE 2.5-2.5 % EX CREA
1.0000 | TOPICAL_CREAM | CUTANEOUS | Status: DC
Start: 2022-02-04 — End: 2022-02-06

## 2022-02-04 MED ORDER — SEVELAMER CARBONATE 800 MG PO TABS: 800.0000 mg | ORAL_TABLET | Freq: Three times a day (TID) | ORAL | Status: DC

## 2022-02-04 MED ORDER — HYDROCORTISONE ACETATE 25 MG RE SUPP
25.0000 mg | Freq: Two times a day (BID) | RECTAL | Status: DC
  Filled 2022-02-04 (×4): qty 1

## 2022-02-04 MED ORDER — SODIUM ZIRCONIUM CYCLOSILICATE 10 G PO PACK
10.0000 g | PACK | Freq: Every day | ORAL | Status: DC
  Filled 2022-02-04: qty 1

## 2022-02-04 MED ORDER — DEXTROSE 10 % IV SOLN
Freq: Once | INTRAVENOUS | Status: AC
Start: 2022-02-04 — End: 2022-02-04

## 2022-02-04 MED ORDER — DEXTROSE 10 % IV SOLN
INTRAVENOUS | Status: DC
Start: 2022-02-04 — End: 2022-02-06

## 2022-02-04 MED ORDER — PREDNISONE 5 MG PO TABS
5.0000 mg | ORAL_TABLET | Freq: Every day | ORAL | Status: DC
Start: 2022-02-05 — End: 2022-02-04

## 2022-02-04 MED ORDER — CHLORHEXIDINE GLUCONATE CLOTH 2 % EX PADS
6.0000 | MEDICATED_PAD | Freq: Every day | CUTANEOUS | Status: DC
  Administered 2022-02-05: 6 via TOPICAL

## 2022-02-04 MED ORDER — POLYETHYLENE GLYCOL 3350 17 G PO PACK
17.0000 g | PACK | Freq: Every day | ORAL | Status: DC | PRN
Start: 2022-02-04 — End: 2022-02-06

## 2022-02-04 MED ORDER — FOLIC ACID 1 MG PO TABS: 1.0000 mg | ORAL_TABLET | Freq: Every day | ORAL | Status: DC

## 2022-02-04 NOTE — Progress Notes (Signed)
Had conversation w/ pt's wife about EOL issues.  We know Mr Fallen well, he is a double amputee on HD for many years and according to his wife has had a "tough last 5 years".  We had discussion about resuscitation/ heroic measures and she says she would want [place] to just "keep him comfortable". She agrees that DNR / no resuscitation would be the best for him.  Will change to DNR status.   Kelly Splinter, MD 01/14/2022, 3:20 PM

## 2022-02-04 NOTE — ED Notes (Signed)
Pt returned from MRI °

## 2022-02-04 NOTE — ED Notes (Signed)
Pt provided with warm blankets; MD notified of hypothermia

## 2022-02-04 NOTE — ED Triage Notes (Signed)
Pt bib ems from dialysis; endorses AMS x 3 days per wife; missed last 3 treatments due to illness; reports diarrhea  1 month; drooling and L sided facial droop reported by dialysis staff; [place] 1015; mixed reports of pt's baseline from staff; wife reports pt normally oriented to self, birthday; limited speech at baseline, hx previous strokes; also report sore on bottom

## 2022-02-04 NOTE — Code Documentation (Addendum)
Jonathan [name] is a 71 yr old male. He has H/O CKD on dialysis, DM, prior CVA, bilat. Leg amputation. He is on Eliquis.    Per EMS pt was in usual state of health today at 1015. After that, he started drooling out of the left side of his mouth and couldn't talk. He was not dialyzed.     Pt arrived to [facility] at 1126. Code Stroke activated by EMS. Airway cleared, CBG obtained at bridge. Unable to get IV access for labs. Pt taken to CT at 1132. CTNC performed and was neg for acute hemorrhage per Dr Cheral Marker. IV access obtained by IV team. 1 amp D 50 given in CT scan by [facility]. Wife obtained by phone. She states he has been like this for 3 days.     Pt returned to room 17 where his workup will continue. He will need q 2 hr VS and NIHSS for 12 hr, then q 4. Bedside handoff with Amador Cunas. Pt OOW for thrombectomy and thrombolysis. Also MRS 4 and on Eliquis preclude him for acute intervention.

## 2022-02-04 NOTE — ED Notes (Signed)
Dr. Zhang at bedside.  

## 2022-02-04 NOTE — ED Notes (Signed)
ED TO INPATIENT HANDOFF REPORT  ED Nurse Name and Phone #: hannie [phone]  S Name/Age/Gender Jonathan [name]. 71 y.o. male Room/Bed: 017C/017C  Code Status   Code Status: DNR  Home/SNF/Other Home Patient oriented to: self Is this baseline? Yes   Triage Complete: Triage complete  Chief Complaint AMS (altered mental status) [R41.82] CHF (congestive heart failure) ([place]) [I50.9]  Triage Note Pt bib ems from dialysis; endorses AMS x 3 days per wife; missed last 3 treatments due to illness; reports diarrhea  1 month; drooling and L sided facial droop reported by dialysis staff; [place] [address]; mixed reports of pt's baseline from staff; wife reports pt normally oriented to self, birthday; limited speech at baseline, hx previous strokes; also report sore on bottom   Allergies No Known Allergies  Level of Care/Admitting Diagnosis ED Disposition     ED Disposition  [place]: [address] [[id number]]  Level of Care: Progressive [102]  Admit to Progressive based on following criteria: CARDIOVASCULAR & THORACIC of moderate stability with acute coronary syndrome symptoms/low risk myocardial infarction/hypertensive urgency/arrhythmias/heart failure potentially compromising stability and stable post cardiovascular intervention patients.  May admit patient to [facility] or [facility] if equivalent level of care is available:: No  Covid Evaluation: Asymptomatic - no recent exposure (last 10 days) testing not required  Diagnosis: CHF (congestive heart failure) [facility]) [[id number]]  Admitting Physician: Lequita Halt [[id number]]  Attending Physician: Lequita Halt [[id number]]  Estimated length of stay: past midnight tomorrow  Certification:: I certify this patient will need inpatient services for at least 2 midnights          B Medical/Surgery History Past Medical History:  Diagnosis Date   CAD (coronary artery disease)    CHF (congestive  heart failure) ([place])    Diabetes mellitus without complication ([place])    type 2   DM (diabetes mellitus) ([place])    ESRD (end stage renal disease) ([place])    dialysis Mon Wed Fri   FUO (fever of unknown origin) 09/17/2019   History of blood transfusion    Hyperlipidemia    Hypertension    Osteomyelitis ([place])    Peripheral arterial disease ([place])    PVD (peripheral vascular disease) ([place])    Renal disorder 2015   right kidney transplant   Stroke [facility])    Vascular dementia [facility])    Past Surgical History:  Procedure Laterality Date   ABDOMINAL AORTOGRAM N/A 08/05/2020   Procedure: ABDOMINAL AORTOGRAM;  Surgeon: [name], MD;  Location: [place] [facility];  Service: Cardiovascular;  Laterality: N/A;   ABDOMINAL AORTOGRAM W/LOWER EXTREMITY N/A 09/09/2020   Procedure: ABDOMINAL AORTOGRAM W/LOWER EXTREMITY;  Surgeon: [name], MD;  Location: [place] [facility];  Service: Cardiovascular;  Laterality: N/A;   AMPUTATION Right 08/06/2020   Procedure: Right Fifth Toe Ray Amputation;  Surgeon: [name], MD;  Location: [place];  Service: Vascular;  Laterality: Right;   AMPUTATION Right 11/02/2020   Procedure: RIGHT AMPUTATION BELOW KNEE;  Surgeon: [name], MD;  Location: [place];  Service: Vascular;  Laterality: Right;   AMPUTATION Left 08/19/2021   Procedure: LEFT ABOVE KNEE AMPUTATION;  Surgeon: [name], MD;  Location: [place];  Service: Vascular;  Laterality: Left;   APPLICATION OF WOUND VAC Left 08/19/2021   Procedure: APPLICATION OF WOUND VAC;  Surgeon: [name], MD;  Location: [place];  Service: Vascular;  Laterality: Left;   BACK SURGERY     Has had 2 back surgeries   BUBBLE STUDY  09/18/2019   Procedure: BUBBLE STUDY;  Surgeon: Buford Dresser, MD;  Location: [place];  Service: Cardiovascular;;   Depression     ENDARTERECTOMY FEMORAL Right 08/06/2020   Procedure: Right Ilio- Femoral Artery Endarterectomy;  Surgeon: [name], MD;   Location: [facility];  Service: Vascular;  Laterality: Right;   HAND SURGERY Right    HAND SURGERY     HIP ARTHROPLASTY Left 06/04/2021   Procedure: ARTHROPLASTY BIPOLAR HIP (HEMIARTHROPLASTY);  Surgeon: Willaim Sheng, MD;  Location: [place];  Service: Orthopedics;  Laterality: Left;   IR DIALY SHUNT INTRO NEEDLE/INTRACATH INITIAL W/IMG RIGHT Right 06/07/2021   IR REMOVAL TUN CV CATH W/O FL  09/03/2019   KIDNEY TRANSPLANT     November 2015   LEG AMPUTATION BELOW KNEE Right    LOWER EXTREMITY ANGIOGRAPHY Bilateral 08/05/2020   Procedure: LOWER EXTREMITY ANGIOGRAPHY;  Surgeon: [name], MD;  Location: [place] [facility];  Service: Cardiovascular;  Laterality: Bilateral;   Memory loss     NEPHRECTOMY TRANSPLANTED ORGAN     PATCH ANGIOPLASTY Right 08/06/2020   Procedure: PATCH ANGIOPLASTY;  Surgeon: [name], MD;  Location[facility] OR;  Service: Vascular;  Laterality: Right;   PERIPHERAL VASCULAR BALLOON ANGIOPLASTY Right 09/09/2020   Procedure: PERIPHERAL VASCULAR BALLOON ANGIOPLASTY;  Surgeon: [name], MD;  Location: [place] [facility];  Service: Cardiovascular;  Laterality: Right;  popliteal   PERIPHERAL VASCULAR INTERVENTION Right 08/05/2020   Procedure: PERIPHERAL VASCULAR INTERVENTION;  Surgeon: [name], MD;  Location: [place] [facility];  Service: Cardiovascular;  Laterality: Right;  SFA   PERIPHERAL VASCULAR INTERVENTION Right 09/09/2020   Procedure: PERIPHERAL VASCULAR INTERVENTION;  Surgeon: [name], MD;  Location: [place] [facility];  Service: Cardiovascular;  Laterality: Right;  external iliac   TEE WITHOUT CARDIOVERSION N/A 09/18/2019   Procedure: TRANSESOPHAGEAL ECHOCARDIOGRAM (TEE);  Surgeon: Buford Dresser, MD;  Location: [facility];  Service: Cardiovascular;  Laterality: N/A;   WOUND DEBRIDEMENT Right 10/08/2020   Procedure: DEBRIDEMENT WOUND RIGHT FOOT;  Surgeon: [name], MD;  Location: [facility] OR;  Service: Vascular;   Laterality: Right;     A IV Location/Drains/Wounds Patient Lines/Drains/Airways Status     Active Line/Drains/Airways     Name Placement date Placement time Site Days   Peripheral IV 01/09/2022 20 G 1.88" Anterior;Left;Upper Arm 01/24/2022  1152  Arm  less than 1   Fistula / Graft Right Upper arm --  --  Upper arm  --   Fistula / Graft Right Upper arm Arteriovenous vein graft --  --  Upper arm  --   Incision (Closed) 08/06/20 Foot Right 08/06/20  1505  -- 547   Incision (Closed) 10/08/20 Foot Right 10/08/20  0759  -- 484   Incision (Closed) 11/02/20 Leg Right 11/02/20  0914  -- 459   Incision (Closed) 06/04/21 Hip Left 06/04/21  1322  -- 245   Incision (Closed) 08/19/21 Leg Left 08/19/21  1204  -- 169   Wound / Incision (Open or Dehisced) 08/24/21 Skin tear Scrotum Mid;Anterior area of skin broken on front area 08/24/21  1700  Scrotum  164            Intake/Output Last 24 hours No intake or output data in the 24 hours ending 01/14/2022 1536  Labs/Imaging Results for orders placed or performed during the hospital encounter of  01/14/2022 (from the past 48 hour(s))  CBG monitoring, ED     Status: Abnormal   Collection Time: 01/14/2022 11:29 AM  Result Value Ref Range   Glucose-Capillary 65 (L) 70 - 99 mg/dL    Comment: Glucose reference range applies only to samples taken after fasting for at least 8 hours.  Resp Panel by RT-PCR (Flu A&B, Covid) Anterior Nasal Swab     Status: None   Collection Time: 01/19/2022 12:20 PM   Specimen: Anterior Nasal Swab  Result Value Ref Range   SARS Coronavirus 2 by RT PCR NEGATIVE NEGATIVE    Comment: (NOTE) SARS-CoV-2 target nucleic acids are NOT DETECTED.  The SARS-CoV-2 RNA is generally detectable in upper respiratory specimens during the acute phase of infection. The lowest concentration of SARS-CoV-2 viral copies this assay can detect is 138 copies/mL. A negative result does not preclude SARS-Cov-2 infection and should not be used as the sole  basis for treatment or other patient management decisions. A negative result may occur with  improper specimen collection/handling, submission of specimen other than nasopharyngeal swab, presence of viral mutation(s) within the areas targeted by this assay, and inadequate number of viral copies(<138 copies/mL). A negative result must be combined with clinical observations, patient history, and epidemiological information. The expected result is Negative.  Fact Sheet for Patients:  EntrepreneurPulse.com.au  Fact Sheet for Healthcare Providers:  IncredibleEmployment.be  This test is no t yet approved or cleared by the [place] FDA and  has been authorized for detection and/or diagnosis of SARS-CoV-2 by FDA under an Emergency Use Authorization (EUA). This EUA will remain  in effect (meaning this test can be used) for the duration of the COVID-19 declaration under Section 564(b)(1) of the Act, 21 U.S.C.section 360bbb-3(b)(1), unless the authorization is terminated  or revoked sooner.       Influenza A by PCR NEGATIVE NEGATIVE   Influenza B by PCR NEGATIVE NEGATIVE    Comment: (NOTE) The Xpert Xpress SARS-CoV-2/FLU/RSV plus assay is intended as an aid in the diagnosis of influenza from Nasopharyngeal swab specimens and should not be used as a sole basis for treatment. Nasal washings and aspirates are unacceptable for Xpert Xpress SARS-CoV-2/FLU/RSV testing.  Fact Sheet for Patients: EntrepreneurPulse.com.au  Fact Sheet for Healthcare Providers: IncredibleEmployment.be  This test is not yet approved or cleared by the [place] FDA and has been authorized for detection and/or diagnosis of SARS-CoV-2 by FDA under an Emergency Use Authorization (EUA). This EUA will remain in effect (meaning this test can be used) for the duration of the COVID-19 declaration under Section 564(b)(1) of the Act, 21  U.S.C. section 360bbb-3(b)(1), unless the authorization is terminated or revoked.  Performed at [facility], [place] [address], [place], [place] [address]   Ethanol     Status: None   Collection Time: 01/20/2022 12:20 PM  Result Value Ref Range   Alcohol, Ethyl (B) <10 <10 mg/dL    Comment: (NOTE) Lowest detectable limit for serum alcohol is 10 mg/dL.  For medical purposes only. Performed at [facility], [place] [address] [place], [place] [address]   Protime-INR     Status: Abnormal   Collection Time: 01/19/2022 12:20 PM  Result Value Ref Range   Prothrombin Time 21.6 (H) 11.4 - 15.2 seconds   INR 1.9 (H) 0.8 - 1.2    Comment: (NOTE) INR goal varies based on device and disease states. Performed at [facility], [place] [address] [place], [place] [address]  APTT     Status: Abnormal   Collection Time: 01/22/2022 12:20 PM  Result Value Ref Range   aPTT 62 (H) 24 - 36 seconds    Comment:        IF BASELINE aPTT IS ELEVATED, SUGGEST PATIENT RISK ASSESSMENT BE USED TO DETERMINE APPROPRIATE ANTICOAGULANT THERAPY. Performed at [facility], [place] [address] [place], [place] [address]   CBC     Status: Abnormal   Collection Time: 01/18/2022 12:20 PM  Result Value Ref Range   WBC 4.8 4.0 - 10.5 K/uL   RBC 3.26 (L) 4.22 - 5.81 MIL/uL   Hemoglobin 11.6 (L) 13.0 - 17.0 g/dL   HCT 34.0 (L) 39.0 - 52.0 %   MCV 104.3 (H) 80.0 - 100.0 fL   MCH 35.6 (H) 26.0 - 34.0 pg   MCHC 34.1 30.0 - 36.0 g/dL   RDW 17.6 (H) 11.5 - 15.5 %   Platelets 81 (L) 150 - 400 K/uL    Comment: Immature Platelet Fraction may be clinically indicated, consider ordering this additional test [id number] REPEATED TO VERIFY    nRBC 0.8 (H) 0.0 - 0.2 %    Comment: Performed at [facility], [place] [address] [place], [place] [address]  Differential     Status: Abnormal   Collection Time: 01/16/2022 12:20 PM  Result Value Ref Range   Neutrophils Relative % 87 %   Neutro Abs 4.1 1.7 - 7.7 K/uL    Lymphocytes Relative 6 %   Lymphs Abs 0.3 (L) 0.7 - 4.0 K/uL   Monocytes Relative 6 %   Monocytes Absolute 0.3 0.1 - 1.0 K/uL   Eosinophils Relative 0 %   Eosinophils Absolute 0.0 0.0 - 0.5 K/uL   Basophils Relative 0 %   Basophils Absolute 0.0 0.0 - 0.1 K/uL   Immature Granulocytes 1 %   Abs Immature Granulocytes 0.03 0.00 - 0.07 K/uL    Comment: Performed at [place] [address] [place], [place] [address]  Comprehensive metabolic panel     Status: Abnormal   Collection Time: 01/12/2022 12:20 PM  Result Value Ref Range   Sodium 137 135 - 145 mmol/L   Potassium 6.4 (HH) 3.5 - 5.1 mmol/L    Comment: CRITICAL RESULT CALLED TO, READ BACK BY AND VERIFIED WITH: [facility] RN @ [phone] 01/07/2022 LEONARD,A    Chloride 94 (L) 98 - 111 mmol/L   CO2 23 22 - 32 mmol/L   Glucose, Bld 191 (H) 70 - 99 mg/dL    Comment: Glucose reference range applies only to samples taken after fasting for at least 8 hours.   BUN 143 (H) 8 - 23 mg/dL   Creatinine, Ser 8.46 (H) 0.61 - 1.24 mg/dL   Calcium 8.9 8.9 - 10.3 mg/dL   Total Protein 5.1 (L) 6.5 - 8.1 g/dL   Albumin 2.8 (L) 3.5 - 5.0 g/dL   AST 57 (H) 15 - 41 U/L   ALT 70 (H) 0 - 44 U/L   Alkaline Phosphatase 172 (H) 38 - 126 U/L   Total Bilirubin 1.1 0.3 - 1.2 mg/dL   GFR, Estimated 6 (L) >60 mL/min    Comment: (NOTE) Calculated using the CKD-EPI Creatinine Equation (2021)    Anion gap 20 (H) 5 - 15    Comment: Performed at [facility], [place] [address], [place], [place] [address]  Troponin I (High Sensitivity)     Status: Abnormal   Collection Time: 01/15/2022 12:20 PM  Result Value Ref Range   Troponin I (High Sensitivity) 41 (H) <18 ng/L    Comment: (NOTE) Elevated high sensitivity troponin I (hsTnI) values and significant  changes across serial measurements may suggest ACS but many other  chronic and acute conditions are known to elevate hsTnI results.  Refer to the "Links" section for chest pain algorithms and  additional  guidance. Performed at [facility], [place] [address], [place], [place] [address]   TSH     Status: None   Collection Time: 01/13/2022 12:20 PM  Result Value Ref Range   TSH 1.243 0.350 - 4.500 uIU/mL    Comment: Performed by a 3rd Generation assay with a functional sensitivity of <=0.01 uIU/mL. Performed at [facility], [place] [address] [place], [place] [address]   I-stat chem 8, ED     Status: Abnormal   Collection Time: 01/13/2022 12:28 PM  Result Value Ref Range   Sodium 132 (L) 135 - 145 mmol/L   Potassium 6.1 (H) 3.5 - 5.1 mmol/L   Chloride 96 (L) 98 - 111 mmol/L   BUN >130 (H) 8 - 23 mg/dL   Creatinine, Ser 9.50 (H) 0.61 - 1.24 mg/dL   Glucose, Bld 180 (H) 70 - 99 mg/dL    Comment: Glucose reference range applies only to samples taken after fasting for at least 8 hours.   Calcium, Ion 1.02 (L) 1.15 - 1.40 mmol/L   TCO2 23 22 - 32 mmol/L   Hemoglobin 12.6 (L) 13.0 - 17.0 g/dL   HCT 37.0 (L) 39.0 - 52.0 %  I-Stat venous blood gas, ED     Status: Abnormal   Collection Time: 02/02/2022 12:28 PM  Result Value Ref Range   pH, Ven 7.398 7.25 - 7.43   pCO2, Ven 39.2 (L) 44 - 60 mmHg   pO2, Ven 31 (LL) 32 - 45 mmHg   Bicarbonate 24.2 20.0 - 28.0 mmol/L   TCO2 25 22 - 32 mmol/L   O2 Saturation 60 %   Acid-base deficit 1.0 0.0 - 2.0 mmol/L   Sodium 132 (L) 135 - 145 mmol/L   Potassium 6.1 (H) 3.5 - 5.1 mmol/L   Calcium, Ion 1.04 (L) 1.15 - 1.40 mmol/L   HCT 37.0 (L) 39.0 - 52.0 %   Hemoglobin 12.6 (L) 13.0 - 17.0 g/dL   Sample type VENOUS    Comment NOTIFIED PHYSICIAN   CBG monitoring, ED     Status: Abnormal   Collection Time: 01/15/2022  2:58 PM  Result Value Ref Range   Glucose-Capillary 102 (H) 70 - 99 mg/dL    Comment: Glucose reference range applies only to samples taken after fasting for at least 8 hours.   MR BRAIN WO CONTRAST  Result Date: 02/03/2022 CLINICAL DATA:  Provided history: Stroke, follow-up. EXAM: MRI HEAD WITHOUT CONTRAST TECHNIQUE:  Multiplanar, multiecho pulse sequences of the brain and surrounding structures were obtained without intravenous contrast. COMPARISON:  Prior head CT examinations 01/26/2022 and earlier. Brain MRI 07/31/2019. FINDINGS: Brain: Moderate to moderately advanced generalized cerebral atrophy. Comparatively mild cerebellar atrophy. 6 mm focus of restricted diffusion within the posterior right frontal lobe white matter compatible with an acute/subacute (series 2, image 32) 4 mm acute infarct within the right occipital lobe (series 2, image 19). 15 mm focus of apparent subtle restricted diffusion within the left corona radiata suspicious for an acute/subacute infarct (series 2, image 28) (series 250, image 28). Moderate-sized chronic cortical/subcortical left infarct within the left frontal lobe/anterior left  insula. Chronic lacunar infarcts within the bilateral cerebral hemispheric white matter, bilateral deep gray nuclei, corpus callosum and within the pons. Background advanced patchy and confluent T2 FLAIR hyperintensity are abnormality within the cerebral white matter and pons, nonspecific but compatible with chronic small vessel ischemic disease. No evidence of an intracranial mass. No chronic intracranial blood products. No extra-axial fluid collection. No midline shift. Vascular: Maintained flow voids within the proximal large arterial vessels. Skull and upper cervical spine: No focal suspicious marrow lesion. Nonspecific reversal of the expected cervical lordosis. Mild C3-C4 and C4-C5 grade 1 retrolisthesis. Incompletely assessed cervical spondylosis. Sinuses/Orbits: No acute orbital finding. Prior bilateral ocular lens replacement. 8 mm cystic focus lateral to the left globe, possibly within the lacrimal gland, unchanged from the prior brain MRI of 07/31/2019. Mild mucosal thickening within the right frontal and anterior right ethmoid sinuses. 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 Trace fluid within the bilateral mastoid air cells. IMPRESSION: 1. Mildly motion degraded exam. 2. 15 mm focus of apparent subtle restricted diffusion within the left corona radiata suspicious for an acute/subacute infarct. 3. 6 mm acute/subacute infarct within the posterior right frontal lobe white matter. 4. 4 mm acute infarct within the right occipital lobe. 5. Background parenchymal atrophy, advanced chronic small vessel ischemic disease and chronic infarcts as detailed and similar to the prior brain MRI of 07/31/2019. Electronically Signed   By: Kellie Simmering D.O.   On: 01/21/2022 14:58   DG Chest Portable 1 View  Result Date: 01/24/2022 CLINICAL DATA:  Cough, difficulty breathing EXAM: PORTABLE CHEST 1 VIEW COMPARISON:  Previous studies including the examination of 09/01/2021 FINDINGS: Transverse diameter of heart is increased. Central pulmonary vessels are prominent. Increased interstitial markings are seen in the parahilar regions and lower lung fields. Small bilateral pleural effusions are seen, more so on the right side. There is no pneumothorax. Vascular stent is noted in the course of right axillary and subclavian vessels. IMPRESSION: Cardiomegaly. Central pulmonary vessels are prominent suggesting CHF. Increased interstitial markings in both lungs suggest pulmonary edema. Small bilateral pleural effusions, more so on the right side. Electronically Signed   By: Elmer Picker M.D.   On: 01/19/2022 12:48   CT HEAD CODE STROKE WO CONTRAST  Result Date: 01/11/2022 CLINICAL DATA:  Code stroke.  Neuro deficit, acute, stroke suspected EXAM: CT HEAD WITHOUT CONTRAST TECHNIQUE: Contiguous axial images were obtained from the base of the skull through the vertex without intravenous contrast. RADIATION DOSE REDUCTION: This exam was performed according to the departmental dose-optimization program which includes automated exposure  control, adjustment of the mA and/or kV according to patient size and/or use of iterative reconstruction technique. COMPARISON:  12/03/2021 FINDINGS: Brain: No acute intracranial hemorrhage, mass effect, or edema. Chronic left frontal infarct. Wallerian degeneration along the left cerebral peduncle. Additional patchy and confluent hypoattenuation in the supratentorial white matter is nonspecific but probably reflects stable chronic microvascular ischemic changes. Probable superimposed chronic small vessel infarcts. Prominence of the ventricles and sulci reflects stable parenchymal volume loss. No extra-axial collection. Vascular: No hyperdense vessel. Atherosclerotic calcification at the skull base. Extensive calcified external carotid artery branches. Skull: Unremarkable. Sinuses/Orbits: No acute finding. Other: Mastoid air cells are clear.  Right occipital scalp lipoma ASPECTS ([place] Stroke Program Early CT Score) - Ganglionic level infarction (caudate, lentiform nuclei, internal capsule, insula, M1-M3 cortex): 7 - Supraganglionic infarction (M4-M6 cortex): 3 Total score (0-10 with 10 being normal): 10  IMPRESSION: There is no acute intracranial hemorrhage or evidence of acute infarction. ASPECT score is 10. No significant change since April 2023 examination. These results were communicated to Dr. Cheral Marker at 11:41 am on 01/14/2022 by text page via the [facility] messaging system. Electronically Signed   By: Macy Mis M.D.   On: 01/16/2022 11:43    Pending Labs Unresulted Labs (From admission, onward)     Start     Ordered   02/05/22 3710  Basic metabolic panel  Tomorrow morning,   R        01/11/2022 1434   02/05/22 0500  CBC  Tomorrow morning,   R        01/14/2022 1434   02/05/22 0500  Hepatic function panel  Tomorrow morning,   R        01/11/2022 1435   01/31/2022 1524  Hepatitis B surface antigen  (New Admission [facility] (Hepatitis B))  Once,   R        01/26/2022 1526   01/28/2022 1524  Hepatitis B  surface antibody  (New Admission [facility] (Hepatitis B))  Once,   R        01/23/2022 1526   01/19/2022 1524  Hepatitis B surface antibody,quantitative  (New Admission [facility] (Hepatitis B))  Once,   R        01/27/2022 1526   01/21/2022 1524  Hepatitis B core antibody, total  (New Admission [facility] (Hepatitis B))  Once,   R        01/31/2022 1526   01/10/2022 1524  Hepatitis C antibody  (New Admission [facility] (Hepatitis B))  Once,   R        01/19/2022 1526   02/03/2022 1510  Glucose, random  (No ECG change & Potassium 5.5 - 6.5)  Once,   STAT       Comments: 1 hour after insulin administered.    01/23/2022 1354   01/17/2022 1434  HIV Antibody (routine testing w rflx)  (HIV Antibody (Routine testing w reflex) panel)  Once,   R        01/10/2022 1434   01/26/2022 1212  Blood gas, venous (at [facility], not at [facility])  Once,   R        01/24/2022 1211   01/16/2022 1128  Urine rapid drug screen (hosp performed)  Once,   STAT        01/29/2022 1127   02/03/2022 1128  Urinalysis, Routine w reflex microscopic  Once,   URGENT        02/03/2022 1127            Vitals/Pain Today's Vitals   01/13/2022 1230 01/10/2022 1235 01/17/2022 1237 01/23/2022 1504  BP: 140/89     Pulse:   85   Resp: 19     Temp:    (!) 95.8 F (35.4 C)  TempSrc:    Rectal  SpO2:  100%    Weight:        Isolation Precautions No active isolations  Medications Medications  sodium zirconium cyclosilicate (LOKELMA) packet 10 g (has no administration in time range)  dextrose 10 % infusion (has no administration in time range)  calcium gluconate 1 g/ 50 mL sodium chloride IVPB (1,000 mg Intravenous New Bag/Given 01/17/2022 1523)  acetaminophen (TYLENOL) tablet 650 mg (has no administration in time range)  allopurinol (ZYLOPRIM) tablet 100 mg (has no administration in time range)  aspirin chewable tablet 81 mg (has no administration  in time range)  atorvastatin (LIPITOR) tablet 40 mg (has no administration in time range)  predniSONE (DELTASONE)  tablet 5 mg (has no administration in time range)  acidophilus (RISAQUAD) capsule 2 capsule (has no administration in time range)  famotidine (PEPCID) tablet 20 mg (has no administration in time range)  Inulin CHEW (has no administration in time range)  polyethylene glycol (MIRALAX / GLYCOLAX) packet 17 g (has no administration in time range)  sevelamer carbonate (RENVELA) tablet 800 mg (has no administration in time range)  apixaban (ELIQUIS) tablet 5 mg (has no administration in time range)  folic acid (FOLVITE) tablet 1 mg (has no administration in time range)  melatonin tablet 5 mg (has no administration in time range)  feeding supplement (BOOST HIGH PROTEIN) liquid 237 mL (has no administration in time range)  levalbuterol (XOPENEX HFA) inhaler 2 puff (has no administration in time range)  hydrocortisone (ANUSOL-HC) suppository 25 mg (has no administration in time range)  lidocaine-prilocaine (EMLA) cream 1 Application (has no administration in time range)  Gerhardt's butt cream 1 Application (has no administration in time range)  insulin aspart (novoLOG) injection 0-6 Units (has no administration in time range)  Chlorhexidine Gluconate Cloth 2 % PADS 6 each (has no administration in time range)  dextrose 50 % solution (  Given 01/07/2022 1152)  furosemide (LASIX) injection 40 mg (40 mg Intravenous Given 01/28/2022 1515)  sodium chloride 0.9 % bolus 500 mL (500 mLs Intravenous New Bag/Given 01/15/2022 1508)  insulin aspart (novoLOG) injection 5 Units (5 Units Intravenous Given 01/16/2022 1511)    And  dextrose 50 % solution 50 mL (50 mLs Intravenous Given 01/28/2022 1510)    Mobility non-ambulatory High fall risk   Focused Assessments    R Recommendations: See Admitting Provider Note  Report given to:   Additional Notes:

## 2022-02-04 NOTE — Consult Note (Signed)
Neurology Consultation  Reason for Consult: Code stroke   Referring Physician: Dr Pearline Cables   CC: From HD due to left facial droop and AMS  History is obtained from:wife via phone and medical record   HPI: Jonathan [name]. is a 71 y.o. 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 Per wife, Jonathan [name], she states LKW on Wednesday. Yesterday he was very lethargic and did not want to do anything. Today she woke him up and noticed he was drooling. Per EMS, while at HD center staff noticed him to be "not himself" and with facial droop. Of note, he has not had HD since last Friday. Also has had diarrhea for the last month or so. At baseline, patient does not speak a lot, but usually can verbalize his name and birth date. He is wheelchair bound and wife is his primary care taker, he will feed himself. On arrival to the ED his CBG was 65. CT head with no acute process.   LKW: Wednesday  IV thrombolysis given?: no, outside of window, presentation more consistent with metabolic encephalopathy than acute stroke  Premorbid modified Rankin scale (mRS):  5-Severe disability-bedridden, incontinent, needs constant attention   ROS:  Unable to obtain due to altered mental status.   Past Medical History:  Diagnosis Date   CAD (coronary artery disease)    CHF (congestive heart failure) ([name])    Diabetes mellitus without complication ([place])    type 2   DM (diabetes mellitus) ([place])    ESRD (end stage renal disease) ([place])    dialysis Mon Wed Fri   FUO (fever of unknown origin) 09/17/2019   History of blood transfusion    Hyperlipidemia    Hypertension    Osteomyelitis ([name])    Peripheral arterial disease ([name])    PVD (peripheral vascular disease) ([place])    Renal disorder 2015   right kidney transplant    Stroke [facility])    Vascular dementia ([place])      Family History  Problem Relation Age of Onset   Diabetes Mellitus II Mother    Cancer Mother        STOMACH   Diabetes Mellitus II Brother    Other Father        UNKOWN   Healthy Daughter    Diabetes Son      Social History:   reports that he has quit smoking. His smoking use included cigarettes. He smoked an average of 1 pack per day. He has never used smokeless tobacco. He reports that he does not currently use alcohol. He reports that he does not use drugs.  Medications  Current Facility-Administered Medications:    dextrose 50 % solution, , , ,   Current Outpatient Medications:    acetaminophen (TYLENOL) 325 MG tablet, Take 2 tablets (650 mg total) by mouth every 6 (six) hours as needed for moderate pain., Disp: , Rfl: 0   acidophilus (RISAQUAD) CAPS capsule, Take 2 capsules by mouth 3 (three) times daily., Disp: 90 capsule, Rfl: 0   allopurinol (ZYLOPRIM) 100 MG tablet, Take 100 mg by mouth at bedtime., Disp: , Rfl:    apixaban (ELIQUIS) 5 MG TABS tablet, Take 1 tablet (5 mg total) by mouth 2 (two) times daily., Disp: 60 tablet, Rfl: 0   aspirin 81  MG chewable tablet, Chew 81 mg by mouth daily. , Disp: , Rfl:    atorvastatin (LIPITOR) 40 MG tablet, Take 40 mg by mouth at bedtime., Disp: , Rfl:    b complex-vitamin c-folic acid (NEPHRO-VITE) 0.8 MG TABS tablet, Take 1 tablet by mouth daily., Disp: , Rfl:    famotidine (PEPCID) 20 MG tablet, TAKE 1 TABLET(20 MG) BY MOUTH AT BEDTIME, Disp: 90 tablet, Rfl: 1   feeding supplement (BOOST HIGH PROTEIN) LIQD, Take 1 Container by mouth daily as needed (nutrition)., Disp: , Rfl:    folic acid (FOLVITE) 1 MG tablet, Take 1 tablet (1 mg total) by mouth daily., Disp: 30 tablet, Rfl: 3   hydrocortisone (ANUSOL-HC) 25 MG suppository, Place 1 suppository (25 mg total) rectally 2 (two) times daily., Disp: 14 suppository, Rfl: 0   insulin aspart (NOVOLOG) 100 UNIT/ML injection, Insulin sliding  scale: Blood sugar  120-150   3units                       151-200   4units                       201-250   7units                       251- 300  11units                       301-350   15uints                       351-400   20units                       >400         call MD immediately, Disp: 10 mL, Rfl: 0   Insulin Pen Needle 32G X 4 MM MISC, 1 each by Other route as needed (insulin). , Disp: , Rfl:    Inulin (FIBER CHOICE PO), Take 1 tablet by mouth daily., Disp: , Rfl:    levalbuterol (XOPENEX HFA) 45 MCG/ACT inhaler, Inhale 2 puffs into the lungs every 8 (eight) hours as needed for wheezing., Disp: 1 Inhaler, Rfl: 12   lidocaine-prilocaine (EMLA) cream, Apply 1 application topically 3 (three) times a week. 30 minutes prior to Dialysis - MWF, Disp: , Rfl: 11   melatonin 5 MG TABS, Take 1 tablet (5 mg total) by mouth at bedtime. (Patient taking differently: Take 5 mg by mouth at bedtime as needed (sleep).), Disp: 30 tablet, Rfl: 0   Nystatin (GERHARDT'S BUTT CREAM) CREA, Apply 1 application topically 4 (four) times daily. Mix desitin with antifugal cream and apply a layer to buttocks/scrotum., Disp: , Rfl:    nystatin (MYCOSTATIN/NYSTOP) powder, Apply topically 3 (three) times daily., Disp: 60 g, Rfl: 0   nystatin cream (MYCOSTATIN), Apply topically 2 (two) times daily., Disp: 30 g, Rfl: 0   polyethylene glycol (MIRALAX / GLYCOLAX) 17 g packet, Take 17 g by mouth daily as needed for mild constipation., Disp: 14 each, Rfl: 0   predniSONE (DELTASONE) 5 MG tablet, Take 5 mg by mouth daily with breakfast., Disp: , Rfl:    sevelamer carbonate (RENVELA) 800 MG tablet, Take 1 tablet (800 mg total) by mouth 3 (three) times daily with meals., Disp: 90 tablet, Rfl: 0   witch hazel-glycerin (TUCKS) pad, Apply topically as needed for itching., Disp: 40  each, Rfl: 12   Exam: Current vital signs: Wt 53.6 kg   BMI 17.45 kg/m  Vital signs in last 24 hours: Weight:  [53.6 kg] 53.6 kg (06/30  1133)  GENERAL: critically ill, elderly male Awake, alert in NAD HEENT: - Normocephalic and atraumatic, dry mm LUNGS - Clear to auscultation bilaterally with no wheezes CV - S1S2 RRR, no m/r/g, equal pulses bilaterally. ABDOMEN - Soft, nontender, nondistended with normoactive BS Ext: warm, well perfused, intact peripheral pulses, no edema. Right BKA, Left AKA  NEURO:  Mental Status: Drowsy to somnolent with anxious affect when aroused. Does not answer any orientation questions. Minimal one-word, hesitant and hypophonic utterances that are at times unintelligible. Follows some simple one-step commands. Does not demonstrate understanding of complex questions or commands.  Cranial Nerves: PERRL, EOMI, no field cut based on assessment of blink to threat, right facial weakness, facial sensation grossly intact, hearing grossly intact, tongue is midline, speech hypoplastic. Head is midline.  Motor: Elevates both arms antigravity. Right BKA, Left AKA with poor cooperation when attempting to assess lower extremity strength. Tone in BUE is mildly reduced. Muscle bulk is diffusely reduced.  Sensation- Grossly intact to touch bilaterally Coordination: Unable to assess due to AMS with poor comprehension of complex commands Gait- Unable to assess  NIHSS 1a Level of Conscious.: 0 1b LOC Questions: 2 1c LOC Commands: 1 2 Best Gaze: 0 3 Visual: 0 4 Facial Palsy: 1 5a Motor Arm - left: 2 5b Motor Arm - Right: 2 6a Motor Leg - Left: 4 6b Motor Leg - Right: 4 7 Limb Ataxia: 0 8 Sensory: 0 9 Best Language: 3 10 Dysarthria: 2 11 Extinct. and Inatten.: 0 TOTAL: 21   Labs I have reviewed labs in epic and the results pertinent to this consultation are: Unable to obtain labs at this time   Imaging I have reviewed the images obtained:  CT-head-no acute intracranial hemorrhage or evidence of acute infarction. ASPECT score is 10. No significant change since April 2023 examination.  Assessment: 71  y.o. 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 Per wife, Jonathan [name], she states LKW on Wednesday. Yesterday he was very lethargic and did not want to do anything. Today she woke him up and noticed he was drooling. Per EMS, while at HD center staff noticed him to be "not himself" and with facial droop. Of note, he has not had HD since last Friday. Also has had diarrhea for the last month or so. At baseline, patient does not speak a lot, but usually can verbalize his name and birth date. He is wheelchair bound and wife is his primary care taker, he will feed himself. On arrival to the ED his CBG was 65. CT head with no acute process. - Exam is most consistent with diffuse cortical dysfunction, as may be seen in a toxic, metabolic or infectious encephalopathy. - CT head: No acute intracranial hemorrhage or evidence of acute infarction. ASPECT score is 10. No significant change since April 2023 examination.  - DDx: Acute Encephalopathy likely multifactorial due to hypoglycemia and uremia. Infection and seizure are lower on the DDx. Acute stroke is felt to be unlikely.  Recommendations: - Obtain MRI brain-if acute stroke on MRI will do stroke workup and stroke team will follow  - Check  CMP, CBC, Mg and Phos.  - Treat hypoglycemia with D50 and recheck CBG - Check CXR to rule out infection  - Will need nephrology consult   Beulah Gandy DNP, [place]   70 year old male with multiple medical comorbidities who presents from dialysis with AMS and facial droop. Neurological exam is most consistent with diffuse cortical dysfunction, as may be seen in a toxic, metabolic or infectious encephalopathy. CT head shows no acute intracranial hemorrhage or evidence of acute infarction. DDx: Acute Encephalopathy likely  multifactorial due to hypoglycemia and uremia. Infection and seizure are lower on the DDx. Acute stroke is felt to be unlikely. Recommendations as above.  Electronically signed: Dr. Kerney Elbe

## 2022-02-04 NOTE — ED Notes (Signed)
Pt transported to MRI 

## 2022-02-04 NOTE — ED Provider Notes (Signed)
[facility] Provider Note   CSN: [id number] Arrival date & time: 01/17/2022  1126  An emergency department physician performed an initial assessment on this suspected stroke patient at 1130.  History  Chief Complaint  Patient presents with   Altered Mental Status    Jonathan [name]. is a 71 y.o. male.  Patient is a 71 year old male with past medical history of hypertension, hyperlipidemia, diabetes s/p bilateral lower extremity amputations, end-stage renal disease on dialysis, immunodeficiency, and osteomyelitis presenting for complaints of altered mental status.  Wife at bedside states that she was called from the hemodialysis unit stating that patient had episode of diarrhea while in line for hemodialysis.  Nursing personnel also noticed that patient was " not acting normally".  Patient's wife states that he is more lethargic and sleeping more than normal.  Otherwise admits to diarrhea x1 month.  Admits to cough x2 weeks.  Denies any fevers or chills.  Denies complaints of abdominal pain.  Denies recent antibiotic use.  The history is provided by the patient. No language interpreter was used.       Home Medications Prior to Admission medications   Medication Sig Start Date End Date Taking? Authorizing Provider  acetaminophen (TYLENOL) 325 MG tablet Take 2 tablets (650 mg total) by mouth every 6 (six) hours as needed for moderate pain. 08/28/21 08/28/22  Florencia Reasons, MD  acidophilus (RISAQUAD) CAPS capsule Take 2 capsules by mouth 3 (three) times daily. 06/23/21   Love, Ivan Anchors, PA-C  allopurinol (ZYLOPRIM) 100 MG tablet Take 100 mg by mouth at bedtime. 06/09/14   [provider]  apixaban (ELIQUIS) 5 MG TABS tablet Take 1 tablet (5 mg total) by mouth 2 (two) times daily. 06/23/21   Love, Ivan Anchors, PA-C  aspirin 81 MG chewable tablet Chew 81 mg by mouth daily.  06/09/14   [provider]  atorvastatin (LIPITOR) 40 MG tablet Take 40  mg by mouth at bedtime.    [provider]  b complex-vitamin c-folic acid (NEPHRO-VITE) 0.8 MG TABS tablet Take 1 tablet by mouth daily. 05/08/20   [provider]  famotidine (PEPCID) 20 MG tablet TAKE 1 TABLET(20 MG) BY MOUTH AT BEDTIME 10/26/21   Noralyn Pick, NP  feeding supplement (BOOST HIGH PROTEIN) LIQD Take 1 Container by mouth daily as needed (nutrition).    [provider]  folic acid (FOLVITE) 1 MG tablet Take 1 tablet (1 mg total) by mouth daily. 05/11/21   Garvin Fila, MD  hydrocortisone (ANUSOL-HC) 25 MG suppository Place 1 suppository (25 mg total) rectally 2 (two) times daily. 06/23/21   Love, Ivan Anchors, PA-C  insulin aspart (NOVOLOG) 100 UNIT/ML injection Insulin sliding scale: Blood sugar  120-150   3units                       151-200   4units                       201-250   7units                       251- 300  11units                       301-350   15uints  351-400   20units                       >400         call MD immediately 08/28/21   Florencia Reasons, MD  Insulin Pen Needle 32G X 4 MM MISC 1 each by Other route as needed (insulin).  09/10/16   [provider]  Inulin (FIBER CHOICE PO) Take 1 tablet by mouth daily.    [provider]  levalbuterol Penne Lash HFA) 45 MCG/ACT inhaler Inhale 2 puffs into the lungs every 8 (eight) hours as needed for wheezing. 09/21/19   Raiford Noble Latif, DO  lidocaine-prilocaine (EMLA) cream Apply 1 application topically 3 (three) times a week. 30 minutes prior to Dialysis - MWF 05/08/18   [provider]  melatonin 5 MG TABS Take 1 tablet (5 mg total) by mouth at bedtime. Patient taking differently: Take 5 mg by mouth at bedtime as needed (sleep). 06/23/21   Love, Ivan Anchors, PA-C  Nystatin (GERHARDT'S BUTT CREAM) CREA Apply 1 application topically 4 (four) times daily. Mix desitin with antifugal cream and apply a layer to buttocks/scrotum. 06/25/21   Love, Ivan Anchors, PA-C  nystatin (MYCOSTATIN/NYSTOP) powder Apply topically 3 (three) times daily. 06/23/21   Love, Ivan Anchors, PA-C  nystatin cream (MYCOSTATIN) Apply topically 2 (two) times daily. 06/23/21   Love, Ivan Anchors, PA-C  polyethylene glycol (MIRALAX / GLYCOLAX) 17 g packet Take 17 g by mouth daily as needed for mild constipation. 06/10/21   Terrilee Croak, MD  predniSONE (DELTASONE) 5 MG tablet Take 5 mg by mouth daily with breakfast. 03/02/20   [provider]  sevelamer carbonate (RENVELA) 800 MG tablet Take 1 tablet (800 mg total) by mouth 3 (three) times daily with meals. 06/25/21   Love, Ivan Anchors, PA-C  witch hazel-glycerin (TUCKS) pad Apply topically as needed for itching. 06/25/21   Bary Leriche, PA-C      Allergies    Patient has no known allergies.    Review of Systems   Review of Systems  Unable to perform ROS: Patient nonverbal    Physical Exam Updated Vital Signs BP 140/89   Pulse 85   Resp 19   Wt 53.6 kg   SpO2 100%   BMI 17.45 kg/m  Physical Exam Vitals and nursing note reviewed.  Constitutional:      General: He is not in acute distress.    Appearance: He is well-developed. He is cachectic.  HENT:     Head: Normocephalic and atraumatic.  Eyes:     Conjunctiva/sclera: Conjunctivae normal.  Cardiovascular:     Rate and Rhythm: Normal rate and regular rhythm.     Heart sounds: No murmur heard. Pulmonary:     Effort: Pulmonary effort is normal. No respiratory distress.     Breath sounds: Normal breath sounds.  Abdominal:     Palpations: Abdomen is soft.     Tenderness: There is no abdominal tenderness.  Musculoskeletal:        General: No swelling.     Cervical back: Neck supple.     Comments: Right below the knee amputation.  Left above-the-knee amputation  Skin:    General: Skin is warm and dry.     Capillary Refill: Capillary refill takes less than 2 seconds.       Neurological:     Mental Status: He is lethargic.     GCS: GCS eye subscore is 2.  GCS verbal  subscore is 1. GCS motor subscore is 4.  Psychiatric:        Mood and Affect: Mood normal.     ED Results / Procedures / Treatments   Labs (all labs ordered are listed, but only abnormal results are displayed) Labs Reviewed  PROTIME-INR - Abnormal; Notable for the following components:      Result Value   Prothrombin Time 21.6 (*)    INR 1.9 (*)    All other components within normal limits  APTT - Abnormal; Notable for the following components:   aPTT 62 (*)    All other components within normal limits  CBC - Abnormal; Notable for the following components:   RBC 3.26 (*)    Hemoglobin 11.6 (*)    HCT 34.0 (*)    MCV 104.3 (*)    MCH 35.6 (*)    RDW 17.6 (*)    Platelets 81 (*)    nRBC 0.8 (*)    All other components within normal limits  DIFFERENTIAL - Abnormal; Notable for the following components:   Lymphs Abs 0.3 (*)    All other components within normal limits  COMPREHENSIVE METABOLIC PANEL - Abnormal; Notable for the following components:   Potassium 6.4 (*)    Chloride 94 (*)    Glucose, Bld 191 (*)    BUN 143 (*)    Creatinine, Ser 8.46 (*)    Total Protein 5.1 (*)    Albumin 2.8 (*)    AST 57 (*)    ALT 70 (*)    Alkaline Phosphatase 172 (*)    GFR, Estimated 6 (*)    Anion gap 20 (*)    All other components within normal limits  I-STAT CHEM 8, ED - Abnormal; Notable for the following components:   Sodium 132 (*)    Potassium 6.1 (*)    Chloride 96 (*)    BUN >130 (*)    Creatinine, Ser 9.50 (*)    Glucose, Bld 180 (*)    Calcium, Ion 1.02 (*)    Hemoglobin 12.6 (*)    HCT 37.0 (*)    All other components within normal limits  CBG MONITORING, ED - Abnormal; Notable for the following components:   Glucose-Capillary 65 (*)    All other components within normal limits  I-STAT VENOUS BLOOD GAS, ED - Abnormal; Notable for the following components:   pCO2, Ven 39.2 (*)    pO2, Ven 31 (*)    Sodium 132 (*)    Potassium 6.1 (*)    Calcium,  Ion 1.04 (*)    HCT 37.0 (*)    Hemoglobin 12.6 (*)    All other components within normal limits  TROPONIN I (HIGH SENSITIVITY) - Abnormal; Notable for the following components:   Troponin I (High Sensitivity) 41 (*)    All other components within normal limits  RESP PANEL BY RT-PCR (FLU A&B, COVID) ARPGX2  ETHANOL  TSH  RAPID URINE DRUG SCREEN, HOSP PERFORMED  URINALYSIS, ROUTINE W REFLEX MICROSCOPIC  BLOOD GAS, VENOUS  GLUCOSE, RANDOM  TROPONIN I (HIGH SENSITIVITY)    EKG None  Radiology DG Chest Portable 1 View  Result Date: 02/01/2022 CLINICAL DATA:  Cough, difficulty breathing EXAM: PORTABLE CHEST 1 VIEW COMPARISON:  Previous studies including the examination of 09/01/2021 FINDINGS: Transverse diameter of heart is increased. Central pulmonary vessels are prominent. Increased interstitial markings are seen in the parahilar regions and lower lung fields. Small bilateral pleural effusions are seen, more so on  the right side. There is no pneumothorax. Vascular stent is noted in the course of right axillary and subclavian vessels. IMPRESSION: Cardiomegaly. Central pulmonary vessels are prominent suggesting CHF. Increased interstitial markings in both lungs suggest pulmonary edema. Small bilateral pleural effusions, more so on the right side. Electronically Signed   By: Elmer Picker M.D.   On: 01/06/2022 12:48   CT HEAD CODE STROKE WO CONTRAST  Result Date: 01/07/2022 CLINICAL DATA:  Code stroke.  Neuro deficit, acute, stroke suspected EXAM: CT HEAD WITHOUT CONTRAST TECHNIQUE: Contiguous axial images were obtained from the base of the skull through the vertex without intravenous contrast. RADIATION DOSE REDUCTION: This exam was performed according to the departmental dose-optimization program which includes automated exposure control, adjustment of the mA and/or kV according to patient size and/or use of iterative reconstruction technique. COMPARISON:  12/03/2021 FINDINGS: Brain: No  acute intracranial hemorrhage, mass effect, or edema. Chronic left frontal infarct. Wallerian degeneration along the left cerebral peduncle. Additional patchy and confluent hypoattenuation in the supratentorial white matter is nonspecific but probably reflects stable chronic microvascular ischemic changes. Probable superimposed chronic small vessel infarcts. Prominence of the ventricles and sulci reflects stable parenchymal volume loss. No extra-axial collection. Vascular: No hyperdense vessel. Atherosclerotic calcification at the skull base. Extensive calcified external carotid artery branches. Skull: Unremarkable. Sinuses/Orbits: No acute finding. Other: Mastoid air cells are clear.  Right occipital scalp lipoma ASPECTS ([place] Stroke Program Early CT Score) - Ganglionic level infarction (caudate, lentiform nuclei, internal capsule, insula, M1-M3 cortex): 7 - Supraganglionic infarction (M4-M6 cortex): 3 Total score (0-10 with 10 being normal): 10 IMPRESSION: There is no acute intracranial hemorrhage or evidence of acute infarction. ASPECT score is 10. No significant change since April 2023 examination. These results were communicated to Dr. Cheral Marker at 11:41 am on 01/17/2022 by text page via the [facility] messaging system. Electronically Signed   By: Macy Mis M.D.   On: 01/23/2022 11:43    Procedures .Critical Care  Performed by: Lianne Cure, DO Authorized by: Lianne Cure, DO   Critical care provider statement:    Critical care time (minutes):  74   Critical care was necessary to treat or prevent imminent or life-threatening deterioration of the following conditions: Rule out stroke, lethargy, altered mental status, hyperkalemia, for dialysis.   Critical care was time spent personally by me on the following activities:  Development of treatment plan with patient or surrogate, discussions with consultants, evaluation of patient's response to treatment, examination of patient, ordering and review of  laboratory studies, ordering and review of radiographic studies, ordering and performing treatments and interventions, pulse oximetry, re-evaluation of patient's condition and review of old charts   Care discussed with comment:  Nephrology and admitting team     Medications Ordered in ED Medications  furosemide (LASIX) injection 40 mg (has no administration in time range)  sodium zirconium cyclosilicate (LOKELMA) packet 10 g (has no administration in time range)  sodium chloride 0.9 % bolus 500 mL (has no administration in time range)  insulin aspart (novoLOG) injection 5 Units (has no administration in time range)    And  dextrose 50 % solution 50 mL (has no administration in time range)  dextrose 10 % infusion (has no administration in time range)  calcium gluconate 1 g/ 50 mL sodium chloride IVPB (has no administration in time range)  dextrose 50 % solution (  Given 01/25/2022 1152)    ED Course/ Medical Decision Making/ A&P  Medical Decision Making Amount and/or Complexity of Data Reviewed Labs: ordered. Radiology: ordered.  Risk OTC drugs. Prescription drug management. Decision regarding hospitalization.    71 year old male with past medical history of hypertension, hyperlipidemia, diabetes s/p bilateral lower extremity amputations, end-stage renal disease on dialysis, immunodeficiency, and osteomyelitis presenting for complaints of lethargy.  On physical exam patient is arousable however opens eyes only to stimuli, does not respond verbally.  Patient's wife states this is not his baseline.  Patient has sacral decubitus ulcer stage I.  No other signs of wounds, injuries, or cellulitis.  Patient has brief soiled in stool on arrival.  Stools formed and not watery in nature.  No suspicion C. difficile.   Patient originally worked up for stroke due to EMS report for altered mental status.  CT head demonstrates no acute process.  MRI pending.  Patient has  stable ECG with no ST segment elevation or depression.  Patient is a end-stage renal disease patient on dialysis who was waiting in line for dialysis when he stooled himself and was told he can no longer get dialysis.  Potassium currently 6.4.  No EKG changes.  Insulin, Lasix, Lokelma, calcium gluconate ordered.  I contacted nephrology who will be on consult and plan for dialysis today.  Other work-up for lethargy includes no profound anemia, no no hypoxia or respiratory distress, no hypercarbia, no signs or symptoms of sepsis, no wounds or cellulitis.  Patient recommended for admission for lethargy with hyperkalemia and need for dialysis.  Family agreeable to plan.  I spoke with admitting physician Dr. Rogene Houston who agrees to accept patient.        Final Clinical Impression(s) / ED Diagnoses Final diagnoses:  Altered mental status, unspecified altered mental status type  Hyperkalemia  Pressure injury of sacral region, stage 1  Diarrhea, unspecified type  Dehydration  End stage renal disease on dialysis [facility])    Rx / DC Orders ED Discharge Orders     None         Lianne Cure, DO 73/22/02 1434

## 2022-02-04 NOTE — Progress Notes (Signed)
Pt receives out-pt HD at [facility] on MWF. Pt arrives at 10:35 for 10:55 chair time. Will assist as needed.   Melven Sartorius Renal Navigator [phone]

## 2022-02-04 NOTE — H&P (Signed)
History and Physical    Jonathan [name] YBO:[id number] DOB: 1951-06-27 DOA: 01/24/2022  PCP: Ginger Organ., MD (Confirm with patient/family/NH records and if not entered, this has to be entered at [facility] point of entry) Patient coming from: Home  I have personally briefly reviewed patient's old medical records in [place]  Chief Complaint: Patient confused  HPI: Jonathan STOOKSBURY Sr. is a 71 y.o. 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  Patient unable to answer any questions, wife at bedside reported history.  Patient started to feel generalized weakness 5 days ago, and missed Monday dialysis.  And it appears that the patient went to HD center on Wednesday but did not get dialysis and wife cannot tell me why.  Over last 2 weeks, patient appeared to be more confused and lethargic.  This morning patient was at HD center, staff there noticed patient had left-sided facial droop and appeared to have a new onset of right-sided weakness and called EMS.  Wife denied the patient has any cough fever chills at home.  ED Course: Patient is hypothermia temperature 95.8, blood pressure slightly elevated no hypoxia.    Stroke trigger, CT head negative for acute findings.  X-ray showed bilateral pulmonary congestion and cardiomegaly.  Blood work showed significant worsening of uremia, BUN 144.  MRI of the brain multiple strokes essentially left corona radiata, right frontal and right occipital.  Review of Systems: Unable to perform, patient confused.  Past Medical History:  Diagnosis Date   CAD (coronary artery disease)    CHF (congestive heart failure) ([name])    Diabetes mellitus without complication ([place])    type 2   DM (diabetes mellitus) ([place])    ESRD (end stage renal  disease) ([place])    dialysis Mon Wed Fri   FUO (fever of unknown origin) 09/17/2019   History of blood transfusion    Hyperlipidemia    Hypertension    Osteomyelitis ([name])    Peripheral arterial disease ([name])    PVD (peripheral vascular disease) ([place])    Renal disorder 2015   right kidney transplant   Stroke [facility])    Vascular dementia [facility])     Past Surgical History:  Procedure Laterality Date   ABDOMINAL AORTOGRAM N/A 08/05/2020   Procedure: ABDOMINAL AORTOGRAM;  Surgeon: [name], MD;  Location: [place] [facility];  Service: Cardiovascular;  Laterality: N/A;   ABDOMINAL AORTOGRAM W/LOWER EXTREMITY N/A 09/09/2020   Procedure: ABDOMINAL AORTOGRAM W/LOWER EXTREMITY;  Surgeon: [name], MD;  Location: [place] [facility];  Service: Cardiovascular;  Laterality: N/A;   AMPUTATION Right 08/06/2020   Procedure: Right Fifth Toe Ray Amputation;  Surgeon: [name], MD;  Location: [place];  Service: Vascular;  Laterality: Right;   AMPUTATION Right 11/02/2020   Procedure: RIGHT AMPUTATION BELOW KNEE;  Surgeon: [name], MD;  Location: [place];  Service: Vascular;  Laterality: Right;   AMPUTATION Left 08/19/2021   Procedure: LEFT ABOVE KNEE AMPUTATION;  Surgeon: [name], MD;  Location: [place];  Service: Vascular;  Laterality: Left;   APPLICATION OF WOUND VAC Left 08/19/2021   Procedure: APPLICATION OF WOUND VAC;  Surgeon: [name], MD;  Location: [place];  Service: Vascular;  Laterality: Left;   BACK  SURGERY     Has had 2 back surgeries   BUBBLE STUDY  09/18/2019   Procedure: BUBBLE STUDY;  Surgeon: Buford Dresser, MD;  Location: [place];  Service: Cardiovascular;;   Depression     ENDARTERECTOMY FEMORAL Right 08/06/2020   Procedure: Right Ilio- Femoral Artery Endarterectomy;  Surgeon: [name], MD;  Location: [facility] OR;  Service: Vascular;  Laterality: Right;   HAND SURGERY Right    HAND SURGERY     HIP ARTHROPLASTY Left 06/04/2021    Procedure: ARTHROPLASTY BIPOLAR HIP (HEMIARTHROPLASTY);  Surgeon: Willaim Sheng, MD;  Location: [place];  Service: Orthopedics;  Laterality: Left;   IR DIALY SHUNT INTRO NEEDLE/INTRACATH INITIAL W/IMG RIGHT Right 06/07/2021   IR REMOVAL TUN CV CATH W/O FL  09/03/2019   KIDNEY TRANSPLANT     November 2015   LEG AMPUTATION BELOW KNEE Right    LOWER EXTREMITY ANGIOGRAPHY Bilateral 08/05/2020   Procedure: LOWER EXTREMITY ANGIOGRAPHY;  Surgeon: [name], MD;  Location: [place] [facility];  Service: Cardiovascular;  Laterality: Bilateral;   Memory loss     NEPHRECTOMY TRANSPLANTED ORGAN     PATCH ANGIOPLASTY Right 08/06/2020   Procedure: PATCH ANGIOPLASTY;  Surgeon: [name], MD;  [facility] OR;  Service: Vascular;  Laterality: Right;   PERIPHERAL VASCULAR BALLOON ANGIOPLASTY Right 09/09/2020   Procedure: PERIPHERAL VASCULAR BALLOON ANGIOPLASTY;  Surgeon: [name], MD;  Location: [place] [facility];  Service: Cardiovascular;  Laterality: Right;  popliteal   PERIPHERAL VASCULAR INTERVENTION Right 08/05/2020   Procedure: PERIPHERAL VASCULAR INTERVENTION;  Surgeon: [name], MD;  Location: [place] [facility];  Service: Cardiovascular;  Laterality: Right;  SFA   PERIPHERAL VASCULAR INTERVENTION Right 09/09/2020   Procedure: PERIPHERAL VASCULAR INTERVENTION;  Surgeon: [name], MD;  Location: [place] [facility];  Service: Cardiovascular;  Laterality: Right;  external iliac   TEE WITHOUT CARDIOVERSION N/A 09/18/2019   Procedure: TRANSESOPHAGEAL ECHOCARDIOGRAM (TEE);  Surgeon: Buford Dresser, MD;  Location: [place];  Service: Cardiovascular;  Laterality: N/A;   WOUND DEBRIDEMENT Right 10/08/2020   Procedure: DEBRIDEMENT WOUND RIGHT FOOT;  Surgeon: [name], MD;  Location: [place];  Service: Vascular;  Laterality: Right;     reports that he has quit smoking. His smoking use included cigarettes. He smoked an average of 1 pack per day. He has  never used smokeless tobacco. He reports that he does not currently use alcohol. He reports that he does not use drugs.  No Known Allergies  Family History  Problem Relation Age of Onset   Diabetes Mellitus II Mother    Cancer Mother        STOMACH   Diabetes Mellitus II Brother    Other Father        UNKOWN   Healthy Daughter    Diabetes Son      Prior to Admission medications   Medication Sig Start Date End Date Taking? Authorizing Provider  acetaminophen (TYLENOL) 325 MG tablet Take 2 tablets (650 mg total) by mouth every 6 (six) hours as needed for moderate pain. 08/28/21 08/28/22  Florencia Reasons, MD  acidophilus (RISAQUAD) CAPS capsule Take 2 capsules by mouth 3 (three) times daily. 06/23/21   Love, Ivan Anchors, PA-C  allopurinol (ZYLOPRIM) 100 MG tablet Take 100 mg by mouth at bedtime. 06/09/14   [provider]  apixaban (ELIQUIS) 5 MG TABS tablet Take 1 tablet (5 mg total) by mouth 2 (two) times daily. 06/23/21  Love, Ivan Anchors, PA-C  aspirin 81 MG chewable tablet Chew 81 mg by mouth daily.  06/09/14   [provider]  atorvastatin (LIPITOR) 40 MG tablet Take 40 mg by mouth at bedtime.    [provider]  b complex-vitamin c-folic acid (NEPHRO-VITE) 0.8 MG TABS tablet Take 1 tablet by mouth daily. 05/08/20   [provider]  famotidine (PEPCID) 20 MG tablet TAKE 1 TABLET(20 MG) BY MOUTH AT BEDTIME 10/26/21   Noralyn Pick, NP  feeding supplement (BOOST HIGH PROTEIN) LIQD Take 1 Container by mouth daily as needed (nutrition).    [provider]  folic acid (FOLVITE) 1 MG tablet Take 1 tablet (1 mg total) by mouth daily. 05/11/21   Garvin Fila, MD  hydrocortisone (ANUSOL-HC) 25 MG suppository Place 1 suppository (25 mg total) rectally 2 (two) times daily. 06/23/21   Love, Ivan Anchors, PA-C  insulin aspart (NOVOLOG) 100 UNIT/ML injection Insulin sliding scale: Blood sugar  120-150   3units                       151-200   4units                        201-250   7units                       251- 300  11units                       301-350   15uints                       351-400   20units                       >400         call MD immediately 08/28/21   Florencia Reasons, MD  Insulin Pen Needle 32G X 4 MM MISC 1 each by Other route as needed (insulin).  09/10/16   [provider]  Inulin (FIBER CHOICE PO) Take 1 tablet by mouth daily.    [provider]  levalbuterol Penne Lash HFA) 45 MCG/ACT inhaler Inhale 2 puffs into the lungs every 8 (eight) hours as needed for wheezing. 09/21/19   Raiford Noble Latif, DO  lidocaine-prilocaine (EMLA) cream Apply 1 application topically 3 (three) times a week. 30 minutes prior to Dialysis - MWF 05/08/18   [provider]  melatonin 5 MG TABS Take 1 tablet (5 mg total) by mouth at bedtime. Patient taking differently: Take 5 mg by mouth at bedtime as needed (sleep). 06/23/21   Love, Ivan Anchors, PA-C  Nystatin (GERHARDT'S BUTT CREAM) CREA Apply 1 application topically 4 (four) times daily. Mix desitin with antifugal cream and apply a layer to buttocks/scrotum. 06/25/21   Love, Ivan Anchors, PA-C  nystatin (MYCOSTATIN/NYSTOP) powder Apply topically 3 (three) times daily. 06/23/21   Love, Ivan Anchors, PA-C  nystatin cream (MYCOSTATIN) Apply topically 2 (two) times daily. 06/23/21   Love, Ivan Anchors, PA-C  polyethylene glycol (MIRALAX / GLYCOLAX) 17 g packet Take 17 g by mouth daily as needed for mild constipation. 06/10/21   Terrilee Croak, MD  predniSONE (DELTASONE) 5 MG tablet Take 5 mg by mouth daily with breakfast. 03/02/20   [provider]  sevelamer carbonate (RENVELA) 800 MG tablet Take 1 tablet (800 mg total) by mouth 3 (  three) times daily with meals. 06/25/21   Love, Ivan Anchors, PA-C  witch hazel-glycerin (TUCKS) pad Apply topically as needed for itching. 06/25/21   Bary Leriche, PA-C    Physical Exam: Vitals:   01/07/2022 1214 01/31/2022 1230 01/09/2022 1235 01/08/2022 1237  BP:  140/89     Pulse:    85  Resp: 19 19    SpO2:   100%   Weight:        Constitutional: NAD, calm, comfortable Vitals:   01/26/2022 1214 01/30/2022 1230 01/29/2022 1235 01/16/2022 1237  BP:  140/89    Pulse:    85  Resp: 19 19    SpO2:   100%   Weight:       Eyes: PERRL, lids and conjunctivae normal ENMT: Mucous membranes are moist. Posterior pharynx clear of any exudate or lesions.Normal dentition.  Neck: normal, supple, no masses, no thyromegaly Respiratory: clear to auscultation bilaterally, no wheezing, bilateral crackles to mid field. Normal respiratory effort. No accessory muscle use.  Cardiovascular: Regular rate and rhythm, no murmurs / rubs / gallops. No extremity edema. 2+ pedal pulses. No carotid bruits.  Abdomen: no tenderness, no masses palpated. No hepatosplenomegaly. Bowel sounds positive.  Musculoskeletal: no clubbing / cyanosis. No joint deformity upper and lower extremities. Good ROM, no contractures. Normal muscle tone.  Skin: no rashes, lesions, ulcers. No induration Neurologic: Left-sided facial droop, appears to have right-sided weakness compared to the left, not following commands Psychiatric: Awake, calm, not in distress   Labs on Admission: I have personally reviewed following labs and imaging studies  CBC: Recent Labs  Lab 01/29/2022 1220 01/17/2022 1228  WBC 4.8  --   NEUTROABS 4.1  --   HGB 11.6* 12.6*  12.6*  HCT 34.0* 37.0*  37.0*  MCV 104.3*  --   PLT 81*  --    Basic Metabolic Panel: Recent Labs  Lab 01/07/2022 1220 01/12/2022 1228  NA 137 132*  132*  K 6.4* 6.1*  6.1*  CL 94* 96*  CO2 23  --   GLUCOSE 191* 180*  BUN 143* >130*  CREATININE 8.46* 9.50*  CALCIUM 8.9  --    GFR: Estimated Creatinine Clearance: 5.6 mL/min (A) (by C-G formula based on SCr of 9.5 mg/dL (H)). Liver Function Tests: Recent Labs  Lab 02/03/2022 1220  AST 57*  ALT 70*  ALKPHOS 172*  BILITOT 1.1  PROT 5.1*  ALBUMIN 2.8*   No results for input(s): "LIPASE", "AMYLASE" in  the last 168 hours. No results for input(s): "AMMONIA" in the last 168 hours. Coagulation Profile: Recent Labs  Lab 01/18/2022 1220  INR 1.9*   Cardiac Enzymes: No results for input(s): "CKTOTAL", "CKMB", "CKMBINDEX", "TROPONINI" in the last 168 hours. BNP (last 3 results) No results for input(s): "PROBNP" in the last 8760 hours. HbA1C: No results for input(s): "HGBA1C" in the last 72 hours. CBG: Recent Labs  Lab 01/17/2022 1129  GLUCAP 65*   Lipid Profile: No results for input(s): "CHOL", "HDL", "LDLCALC", "TRIG", "CHOLHDL", "LDLDIRECT" in the last 72 hours. Thyroid Function Tests: Recent Labs    01/31/2022 1220  TSH 1.243   Anemia Panel: No results for input(s): "VITAMINB12", "FOLATE", "FERRITIN", "TIBC", "IRON", "RETICCTPCT" in the last 72 hours. Urine analysis:    Component Value Date/Time   COLORURINE AMBER (A) 09/11/2019 0534   APPEARANCEUR TURBID (A) 09/11/2019 0534   LABSPEC 1.012 09/11/2019 0534   PHURINE 8.0 09/11/2019 0534   GLUCOSEU NEGATIVE 09/11/2019 0534   HGBUR LARGE (A) 09/11/2019  [place] 09/11/2019 [place] 09/11/2019 0534   PROTEINUR 100 (A) 09/11/2019 0534   NITRITE NEGATIVE 09/11/2019 0534   LEUKOCYTESUR LARGE (A) 09/11/2019 0534    Radiological Exams on Admission: DG Chest Portable 1 View  Result Date: 01/17/2022 CLINICAL DATA:  Cough, difficulty breathing EXAM: PORTABLE CHEST 1 VIEW COMPARISON:  Previous studies including the examination of 09/01/2021 FINDINGS: Transverse diameter of heart is increased. Central pulmonary vessels are prominent. Increased interstitial markings are seen in the parahilar regions and lower lung fields. Small bilateral pleural effusions are seen, more so on the right side. There is no pneumothorax. Vascular stent is noted in the course of right axillary and subclavian vessels. IMPRESSION: Cardiomegaly. Central pulmonary vessels are prominent suggesting CHF. Increased interstitial  markings in both lungs suggest pulmonary edema. Small bilateral pleural effusions, more so on the right side. Electronically Signed   By: Elmer Picker M.D.   On: 01/26/2022 12:48   CT HEAD CODE STROKE WO CONTRAST  Result Date: 01/24/2022 CLINICAL DATA:  Code stroke.  Neuro deficit, acute, stroke suspected EXAM: CT HEAD WITHOUT CONTRAST TECHNIQUE: Contiguous axial images were obtained from the base of the skull through the vertex without intravenous contrast. RADIATION DOSE REDUCTION: This exam was performed according to the departmental dose-optimization program which includes automated exposure control, adjustment of the mA and/or kV according to patient size and/or use of iterative reconstruction technique. COMPARISON:  12/03/2021 FINDINGS: Brain: No acute intracranial hemorrhage, mass effect, or edema. Chronic left frontal infarct. Wallerian degeneration along the left cerebral peduncle. Additional patchy and confluent hypoattenuation in the supratentorial white matter is nonspecific but probably reflects stable chronic microvascular ischemic changes. Probable superimposed chronic small vessel infarcts. Prominence of the ventricles and sulci reflects stable parenchymal volume loss. No extra-axial collection. Vascular: No hyperdense vessel. Atherosclerotic calcification at the skull base. Extensive calcified external carotid artery branches. Skull: Unremarkable. Sinuses/Orbits: No acute finding. Other: Mastoid air cells are clear.  Right occipital scalp lipoma ASPECTS ([place] Stroke Program Early CT Score) - Ganglionic level infarction (caudate, lentiform nuclei, internal capsule, insula, M1-M3 cortex): 7 - Supraganglionic infarction (M4-M6 cortex): 3 Total score (0-10 with 10 being normal): 10 IMPRESSION: There is no acute intracranial hemorrhage or evidence of acute infarction. ASPECT score is 10. No significant change since April 2023 examination. These results were communicated to Dr. Cheral Marker at  11:41 am on 01/12/2022 by text page via the [facility] messaging system. Electronically Signed   By: [place] Mis M.D.   On: 01/10/2022 11:43    EKG: Independently reviewed.  Sinus, no acute ST changes.  Assessment/Plan Principal Problem:   AMS (altered mental status) Active Problems:   Acute encephalopathy  (please populate well all problems here in Problem List. (For example, if patient is on BP meds at home and you resume or decide to hold them, it is a problem that needs to be her. Same for CAD, COPD, HLD and so on)  Acute uremic encephalopathy -Multiple factorial, majority of the reason likely worsening uremia, as well as multifocal strokes. -Emergency HD, nephrology consulted in the ED. -Recheck kidney function BUN level in AM -Prognosis poor, nephrology made patient DNR.  Discussed with wife at bedside, agreed with palliative care.  Hyperkalemia -1 dose of Lokelma in the ED, emergency HD today. -Recheck potassium level tomorrow.  Embolic stroke acute, -Multiple foci of strokes with concern about embolic stroke, patient however on Eliquis.  Wife reported patient has been compliant with Eliquis.  Check  echocardiogram. -Overall prognosis poor given size and location of the stroke likely disable patient's right-sided limb function, speech and coordination/balance.  Wife made aware, palliative care underway.  Acute on chronic combined systolic and diastolic CHF decompensation -1 dose of Lasix given, emergency HD.  Acute aphasia and dysphagia -Secondary to stroke -N.p.o. and speech evaluation  Hypoglycemia hypoglycemia -Doubt patient will be able to resume p.o. intake anytime soon, start patient on D10 drip.  Chronic steroid use -Patient on chronic prednisone.  Given acute phase sickness, plan to switch to stress dose of hydrocortisone however due to national wide shortage, will change it to IV Solu-Medrol for now.  IDDM with hyperglycemia -N.p.o., D10 drip, sliding scale  ESRD  on HD -With hyperglycemia, worsening of uremia and  LV thrombosis -Continue Eliquis   DVT prophylaxis: Eliquis Code Status: DNR Family Communication: Wife at bedside Disposition Plan: Patient sick with CHF decompensation, multiple strokes, after mentations, worsening uremia, requiring emergency dialysis, expect more than 2 midnight hospital stay, expect discharge to hospice Consults called: Nephrology, neurology, palliative care Admission status: PCU  Lequita Halt MD [facility] Pager [phone]  01/26/2022, 2:35 PM

## 2022-02-04 NOTE — Consult Note (Addendum)
Renal Service Consult Note [facility]  Jonathan ROZA Sr. 01/18/2022 Sol Blazing, MD Requesting Physician: Dr. Roosevelt Locks  Reason for Consult: ESRD pt w/ hyperkalemia and AMS HPI: The patient is a 71 y.o. year-old w/ hx of CAD, CHF, DM2, ESRD on HD, HL, HTN, PAD bilat LE amputee, hx failed renal transplant, sp CVAs, vascular dementia who presented sent from HD for AMS. Was at HD today and was not his usual self, lethargic and a bit confused. Also had diarrhea getting ready to start HD to HD was cancelled. His Wed HD was also cancelled due to diarrhea. In ED bP 140/90, HR 80, RR 18, rectal temp 95.8. Na 132, K+ 6.1, repeat 6.4, BUN >130, creat 9.5, Ca 8.9  alb 2.8  trop 41, WBC 4K , Hb 12.6, INR 1.9.  TSH 1.2. CXR showed vasc congestion and mild IS edema. CT head negative for acute change. MRI brain pending. We are asked to see for dialysis.   Pt seen in ED room. Pt obtunded. Wife gives hx that patient on HD for many years. Lately, he has been more fatigued and has intermittent diarrhea. His "last 5 years" he has been declining overall. He is quiet person in general. Hx of multiple "mini strokes". Hx of dementia. When healthy can feed himself, but often will pocket the food in his cheek.   ROS - denies CP, no joint pain, no HA, no blurry vision, no rash, no diarrhea, no nausea/ vomiting, no dysuria, no difficulty voiding   Past Medical History  Past Medical History:  Diagnosis Date   CAD (coronary artery disease)    CHF (congestive heart failure) ([name])    Diabetes mellitus without complication ([place])    type 2   DM (diabetes mellitus) ([place])    ESRD (end stage renal disease) ([place])    dialysis Mon Wed Fri   FUO (fever of unknown origin) 09/17/2019   History of blood transfusion    Hyperlipidemia    Hypertension    Osteomyelitis ([place])    Peripheral arterial disease ([name])    PVD (peripheral vascular disease) ([place])    Renal disorder 2015   right kidney transplant   Stroke  [facility])    Vascular dementia [facility])    Past Surgical History  Past Surgical History:  Procedure Laterality Date   ABDOMINAL AORTOGRAM N/A 08/05/2020   Procedure: ABDOMINAL AORTOGRAM;  Surgeon: [name], MD;  Location: [place] CV LAB;  Service: Cardiovascular;  Laterality: N/A;   ABDOMINAL AORTOGRAM W/LOWER EXTREMITY N/A 09/09/2020   Procedure: ABDOMINAL AORTOGRAM W/LOWER EXTREMITY;  Surgeon: [name], MD;  Location: [place] CV [facility];  Service: Cardiovascular;  Laterality: N/A;   AMPUTATION Right 08/06/2020   Procedure: Right Fifth Toe Ray Amputation;  Surgeon: [name], MD;  Location: [place];  Service: Vascular;  Laterality: Right;   AMPUTATION Right 11/02/2020   Procedure: RIGHT AMPUTATION BELOW KNEE;  Surgeon: [name], MD;  Location: [place];  Service: Vascular;  Laterality: Right;   AMPUTATION Left 08/19/2021   Procedure: LEFT ABOVE KNEE AMPUTATION;  Surgeon: [name], MD;  Location: [place];  Service: Vascular;  Laterality: Left;   APPLICATION OF WOUND VAC Left 08/19/2021   Procedure: APPLICATION OF WOUND VAC;  Surgeon: [name], MD;  Location: [place];  Service: Vascular;  Laterality: Left;   BACK SURGERY     Has had 2 back surgeries   BUBBLE STUDY  09/18/2019   Procedure: BUBBLE  STUDY;  Surgeon: Buford Dresser, MD;  Location: [place];  Service: Cardiovascular;;   Depression     ENDARTERECTOMY FEMORAL Right 08/06/2020   Procedure: Right Ilio- Femoral Artery Endarterectomy;  Surgeon: [name], MD;  Location: [facility] OR;  Service: Vascular;  Laterality: Right;   HAND SURGERY Right    HAND SURGERY     HIP ARTHROPLASTY Left 06/04/2021   Procedure: ARTHROPLASTY BIPOLAR HIP (HEMIARTHROPLASTY);  Surgeon: Willaim Sheng, MD;  Location: [place];  Service: Orthopedics;  Laterality: Left;   IR DIALY SHUNT INTRO NEEDLE/INTRACATH INITIAL W/IMG RIGHT Right 06/07/2021   IR REMOVAL TUN CV CATH W/O FL  09/03/2019   KIDNEY TRANSPLANT      November 2015   LEG AMPUTATION BELOW KNEE Right    LOWER EXTREMITY ANGIOGRAPHY Bilateral 08/05/2020   Procedure: LOWER EXTREMITY ANGIOGRAPHY;  Surgeon: [name], MD;  Location: [place] CV [facility];  Service: Cardiovascular;  Laterality: Bilateral;   Memory loss     NEPHRECTOMY TRANSPLANTED ORGAN     PATCH ANGIOPLASTY Right 08/06/2020   Procedure: PATCH ANGIOPLASTY;  Surgeon: [name], MD;  Location: [facility] OR;  Service: Vascular;  Laterality: Right;   PERIPHERAL VASCULAR BALLOON ANGIOPLASTY Right 09/09/2020   Procedure: PERIPHERAL VASCULAR BALLOON ANGIOPLASTY;  Surgeon: [name], MD;  Location: [place] CV [facility];  Service: Cardiovascular;  Laterality: Right;  popliteal   PERIPHERAL VASCULAR INTERVENTION Right 08/05/2020   Procedure: PERIPHERAL VASCULAR INTERVENTION;  Surgeon: [name], MD;  Location: [place] CV [facility];  Service: Cardiovascular;  Laterality: Right;  SFA   PERIPHERAL VASCULAR INTERVENTION Right 09/09/2020   Procedure: PERIPHERAL VASCULAR INTERVENTION;  Surgeon: [name], MD;  Location: [place] CV [facility];  Service: Cardiovascular;  Laterality: Right;  external iliac   TEE WITHOUT CARDIOVERSION N/A 09/18/2019   Procedure: TRANSESOPHAGEAL ECHOCARDIOGRAM (TEE);  Surgeon: Buford Dresser, MD;  Location: [facility] ENDOSCOPY;  Service: Cardiovascular;  Laterality: N/A;   WOUND DEBRIDEMENT Right 10/08/2020   Procedure: DEBRIDEMENT WOUND RIGHT FOOT;  Surgeon: [name], MD;  Location: [facility] OR;  Service: Vascular;  Laterality: Right;   Family History  Family History  Problem Relation Age of Onset   Diabetes Mellitus II Mother    Cancer Mother        STOMACH   Diabetes Mellitus II Brother    Other Father        UNKOWN   Healthy Daughter    Diabetes Son    Social History  reports that he has quit smoking. His smoking use included cigarettes. He smoked an average of 1 pack per day. He has never used smokeless tobacco. He reports that he  does not currently use alcohol. He reports that he does not use drugs. Allergies No Known Allergies Home medications Prior to Admission medications   Medication Sig Start Date End Date Taking? Authorizing Provider  acetaminophen (TYLENOL) 325 MG tablet Take 2 tablets (650 mg total) by mouth every 6 (six) hours as needed for moderate pain. 08/28/21 08/28/22  Florencia Reasons, MD  acidophilus (RISAQUAD) CAPS capsule Take 2 capsules by mouth 3 (three) times daily. 06/23/21   Love, Ivan Anchors, PA-C  allopurinol (ZYLOPRIM) 100 MG tablet Take 100 mg by mouth at bedtime. 06/09/14   [provider]  apixaban (ELIQUIS) 5 MG TABS tablet Take 1 tablet (5 mg total) by mouth 2 (two) times daily. 06/23/21   Love, Ivan Anchors, PA-C  aspirin 81 MG chewable tablet Chew 81 mg by mouth daily.  06/09/14   [provider]  atorvastatin (LIPITOR) 40 MG tablet Take 40 mg by mouth at bedtime.    [provider]  b complex-vitamin c-folic acid (NEPHRO-VITE) 0.8 MG TABS tablet Take 1 tablet by mouth daily. 05/08/20   [provider]  famotidine (PEPCID) 20 MG tablet TAKE 1 TABLET(20 MG) BY MOUTH AT BEDTIME 10/26/21   Noralyn Pick, NP  feeding supplement (BOOST HIGH PROTEIN) LIQD Take 1 Container by mouth daily as needed (nutrition).    [provider]  folic acid (FOLVITE) 1 MG tablet Take 1 tablet (1 mg total) by mouth daily. 05/11/21   Garvin Fila, MD  hydrocortisone (ANUSOL-HC) 25 MG suppository Place 1 suppository (25 mg total) rectally 2 (two) times daily. 06/23/21   Love, Ivan Anchors, PA-C  insulin aspart (NOVOLOG) 100 UNIT/ML injection Insulin sliding scale: Blood sugar  120-150   3units                       151-200   4units                       201-250   7units                       251- 300  11units                       301-350   15uints                       351-400   20units                       >400         call MD immediately 08/28/21   Florencia Reasons, MD  Insulin Pen  Needle 32G X 4 MM MISC 1 each by Other route as needed (insulin).  09/10/16   [provider]  Inulin (FIBER CHOICE PO) Take 1 tablet by mouth daily.    [provider]  levalbuterol Penne Lash HFA) 45 MCG/ACT inhaler Inhale 2 puffs into the lungs every 8 (eight) hours as needed for wheezing. 09/21/19   Raiford Noble Latif, DO  lidocaine-prilocaine (EMLA) cream Apply 1 application topically 3 (three) times a week. 30 minutes prior to Dialysis - MWF 05/08/18   [provider]  melatonin 5 MG TABS Take 1 tablet (5 mg total) by mouth at bedtime. Patient taking differently: Take 5 mg by mouth at bedtime as needed (sleep). 06/23/21   Love, Ivan Anchors, PA-C  Nystatin (GERHARDT'S BUTT CREAM) CREA Apply 1 application topically 4 (four) times daily. Mix desitin with antifugal cream and apply a layer to buttocks/scrotum. 06/25/21   Love, Ivan Anchors, PA-C  nystatin (MYCOSTATIN/NYSTOP) powder Apply topically 3 (three) times daily. 06/23/21   Love, Ivan Anchors, PA-C  nystatin cream (MYCOSTATIN) Apply topically 2 (two) times daily. 06/23/21   Love, Ivan Anchors, PA-C  polyethylene glycol (MIRALAX / GLYCOLAX) 17 g packet Take 17 g by mouth daily as needed for mild constipation. 06/10/21   Terrilee Croak, MD  predniSONE (DELTASONE) 5 MG tablet Take 5 mg by mouth daily with breakfast. 03/02/20   [provider]  sevelamer carbonate (RENVELA) 800 MG tablet Take 1 tablet (800 mg total) by mouth 3 (three) times daily with meals. 06/25/21   Bary Leriche, PA-C  witch hazel-glycerin (TUCKS) pad  Apply topically as needed for itching. 06/25/21   Bary Leriche, PA-C     Vitals:   02/03/2022 1230 02/01/2022 1235 01/09/2022 1237 02/01/2022 1504  BP: 140/89     Pulse:   85   Resp: 19     Temp:    (!) 95.8 F (35.4 C)  TempSrc:    Rectal  SpO2:  100%    Weight:       Exam Gen lethargic, not responding to voice No rash, cyanosis  No jvd or bruits Chest clear bilat to bases, no rales/ wheezing RRR no  RG Abd soft ntnd no mass or ascites +bs GU normal male MS bilat LE amputee Ext mild 1+ bilat  LE edema Neuro is somnolent, not following simple commands    RUA AVF + bruit   Home meds include - allopurinol, apixaban, aspirin, atorvastatin, famotidine, Boost, insulin aspart, nephrovite, prednisone 5 am, sevelamer 800 ac tid, prns/ vits/ supps     OP HD: [facility] MWF 4h 15min  400/1.5  50kg  2/2 bath  Heparin 3000  RUA AVF - mircera 100 q2, last 6/28 - hectorol 4 ug IV tiw - last full HD 6/23, post wt 50.7kg - only made 5 HD sessions in the month of June (usually 12)   Assessment/ Plan: AMS - CT negative, hx multiple prior CVA's. MRI shows possible new CVA. Per pmd.  Hyperkalemia - K+ 6.1 then 6.4. getting temporizing measures in ED, will plan HD this afternoon or evening.  ESRD - usual HD MWF. Misses lots of HD, has only had 5 sessions in month of June. Plan for HD tonight, perhaps tomorrow as well.  BP/ volume - bp's okay, not on BP lowering meds at home. Has some mild edema in the legs. Early CHF by CXR.  Plan max UF w/ HD tonight.  DNR - per discussion w/ pt's wife today, see other note Anemia esrd - Hb 11- 12 here, no esa needs. Last esa was 6/28.  MBD ckd - CCa in range, add on phos. Cont binder, IV vdra w/ hd.  Vasc dementia/ hx prior CVA's PAD - bilat amputee DM - on insulin H/o failed renal transplant - remains on prednisone      Rob Freddi Schrager  MD 02/01/2022, 3:30 PM Recent Labs  Lab 01/16/2022 1220 01/14/2022 1228  HGB 11.6* 12.6*  12.6*  ALBUMIN 2.8*  --   CALCIUM 8.9  --   CREATININE 8.46* 9.50*  K 6.4* 6.1*  6.1*

## 2022-02-05 ENCOUNTER — Inpatient Hospital Stay (HOSPITAL_COMMUNITY): Payer: Medicare Other

## 2022-02-05 DIAGNOSIS — Z515 Encounter for palliative care: Secondary | ICD-10-CM | POA: Diagnosis not present

## 2022-02-05 DIAGNOSIS — R4182 Altered mental status, unspecified: Secondary | ICD-10-CM | POA: Diagnosis not present

## 2022-02-05 DIAGNOSIS — Z7189 Other specified counseling: Secondary | ICD-10-CM

## 2022-02-05 DIAGNOSIS — I6340 Cerebral infarction due to embolism of unspecified cerebral artery: Secondary | ICD-10-CM

## 2022-02-05 DIAGNOSIS — I5043 Acute on chronic combined systolic (congestive) and diastolic (congestive) heart failure: Secondary | ICD-10-CM

## 2022-02-05 DIAGNOSIS — E875 Hyperkalemia: Secondary | ICD-10-CM

## 2022-02-05 DIAGNOSIS — I6389 Other cerebral infarction: Secondary | ICD-10-CM

## 2022-02-05 DIAGNOSIS — S88119A Complete traumatic amputation at level between knee and ankle, unspecified lower leg, initial encounter: Secondary | ICD-10-CM

## 2022-02-05 DIAGNOSIS — G9341 Metabolic encephalopathy: Secondary | ICD-10-CM

## 2022-02-05 DIAGNOSIS — Z7952 Long term (current) use of systemic steroids: Secondary | ICD-10-CM

## 2022-02-05 LAB — HEMOGLOBIN A1C
Hgb A1c MFr Bld: 4.1 % — ABNORMAL LOW (ref 4.8–5.6)
Mean Plasma Glucose: 70.97 mg/dL

## 2022-02-05 LAB — CBC
HCT: 36.7 % — ABNORMAL LOW (ref 39.0–52.0)
HCT: 38.5 % — ABNORMAL LOW (ref 39.0–52.0)
Hemoglobin: 12.5 g/dL — ABNORMAL LOW (ref 13.0–17.0)
Hemoglobin: 13.5 g/dL (ref 13.0–17.0)
MCH: 34.2 pg — ABNORMAL HIGH (ref 26.0–34.0)
MCH: 35.3 pg — ABNORMAL HIGH (ref 26.0–34.0)
MCHC: 34.1 g/dL (ref 30.0–36.0)
MCHC: 35.1 g/dL (ref 30.0–36.0)
MCV: 100.3 fL — ABNORMAL HIGH (ref 80.0–100.0)
MCV: 100.8 fL — ABNORMAL HIGH (ref 80.0–100.0)
Platelets: 82 10*3/uL — ABNORMAL LOW (ref 150–400)
Platelets: 101 10*3/uL — ABNORMAL LOW (ref 150–400)
RBC: 3.66 MIL/uL — ABNORMAL LOW (ref 4.22–5.81)
RBC: 3.82 MIL/uL — ABNORMAL LOW (ref 4.22–5.81)
RDW: 17.5 % — ABNORMAL HIGH (ref 11.5–15.5)
RDW: 17.8 % — ABNORMAL HIGH (ref 11.5–15.5)
WBC: 5.3 10*3/uL (ref 4.0–10.5)
WBC: 7.2 10*3/uL (ref 4.0–10.5)
nRBC: 0.6 % — ABNORMAL HIGH (ref 0.0–0.2)
nRBC: 0.7 % — ABNORMAL HIGH (ref 0.0–0.2)

## 2022-02-05 LAB — HEPATIC FUNCTION PANEL
ALT: 66 U/L — ABNORMAL HIGH (ref 0–44)
AST: 36 U/L (ref 15–41)
Albumin: 3 g/dL — ABNORMAL LOW (ref 3.5–5.0)
Alkaline Phosphatase: 184 U/L — ABNORMAL HIGH (ref 38–126)
Bilirubin, Direct: 0.3 mg/dL — ABNORMAL HIGH (ref 0.0–0.2)
Indirect Bilirubin: 1 mg/dL — ABNORMAL HIGH (ref 0.3–0.9)
Total Bilirubin: 1.3 mg/dL — ABNORMAL HIGH (ref 0.3–1.2)
Total Protein: 5.6 g/dL — ABNORMAL LOW (ref 6.5–8.1)

## 2022-02-05 LAB — BLOOD GAS, VENOUS
Acid-Base Excess: 4.5 mmol/L — ABNORMAL HIGH (ref 0.0–2.0)
Bicarbonate: 28.4 mmol/L — ABNORMAL HIGH (ref 20.0–28.0)
Drawn by: 164
O2 Saturation: 83.8 %
Patient temperature: 37
pCO2, Ven: 39 mmHg — ABNORMAL LOW (ref 44–60)
pH, Ven: 7.47 — ABNORMAL HIGH (ref 7.25–7.43)
pO2, Ven: 55 mmHg — ABNORMAL HIGH (ref 32–45)

## 2022-02-05 LAB — RENAL FUNCTION PANEL
Albumin: 3.1 g/dL — ABNORMAL LOW (ref 3.5–5.0)
Anion gap: 15 (ref 5–15)
BUN: 48 mg/dL — ABNORMAL HIGH (ref 8–23)
CO2: 26 mmol/L (ref 22–32)
Calcium: 8.9 mg/dL (ref 8.9–10.3)
Chloride: 94 mmol/L — ABNORMAL LOW (ref 98–111)
Creatinine, Ser: 3.92 mg/dL — ABNORMAL HIGH (ref 0.61–1.24)
GFR, Estimated: 16 mL/min — ABNORMAL LOW (ref >60)
Glucose, Bld: 90 mg/dL (ref 70–99)
Phosphorus: 5.5 mg/dL — ABNORMAL HIGH (ref 2.5–4.6)
Potassium: 4.3 mmol/L (ref 3.5–5.1)
Sodium: 135 mmol/L (ref 135–145)

## 2022-02-05 LAB — HEPARIN LEVEL (UNFRACTIONATED)
Heparin Unfractionated: 1.05 IU/mL — ABNORMAL HIGH (ref 0.30–0.70)
Heparin Unfractionated: 1.08 IU/mL — ABNORMAL HIGH (ref 0.30–0.70)

## 2022-02-05 LAB — LIPID PANEL
Cholesterol: 114 mg/dL (ref 0–200)
HDL: 59 mg/dL (ref >40)
LDL Cholesterol: 51 mg/dL (ref 0–99)
Total CHOL/HDL Ratio: 1.9 RATIO
Triglycerides: 21 mg/dL (ref <150)
VLDL: 4 mg/dL (ref 0–40)

## 2022-02-05 LAB — ECHOCARDIOGRAM COMPLETE BUBBLE STUDY
Area-P 1/2: 5.27 cm2
MV M vel: 4.42 m/s
MV Peak grad: 78.1 mmHg
Radius: 0.7 cm
S' Lateral: 4.5 cm
Single Plane A4C EF: 23.2 %

## 2022-02-05 LAB — BASIC METABOLIC PANEL
Anion gap: 19 — ABNORMAL HIGH (ref 5–15)
BUN: 74 mg/dL — ABNORMAL HIGH (ref 8–23)
CO2: 24 mmol/L (ref 22–32)
Calcium: 9.2 mg/dL (ref 8.9–10.3)
Chloride: 93 mmol/L — ABNORMAL LOW (ref 98–111)
Creatinine, Ser: 5.4 mg/dL — ABNORMAL HIGH (ref 0.61–1.24)
GFR, Estimated: 11 mL/min — ABNORMAL LOW (ref >60)
Glucose, Bld: 134 mg/dL — ABNORMAL HIGH (ref 70–99)
Potassium: 4.8 mmol/L (ref 3.5–5.1)
Sodium: 136 mmol/L (ref 135–145)

## 2022-02-05 LAB — PHOSPHORUS: Phosphorus: 6.5 mg/dL — ABNORMAL HIGH (ref 2.5–4.6)

## 2022-02-05 LAB — GLUCOSE, CAPILLARY
Glucose-Capillary: 111 mg/dL — ABNORMAL HIGH (ref 70–99)
Glucose-Capillary: 141 mg/dL — ABNORMAL HIGH (ref 70–99)
Glucose-Capillary: 99 mg/dL (ref 70–99)

## 2022-02-05 LAB — HEPATITIS B SURFACE ANTIBODY, QUANTITATIVE: Hep B S AB Quant (Post): 3.2 m[IU]/mL — ABNORMAL LOW (ref >9.9)

## 2022-02-05 LAB — APTT
aPTT: 38 seconds — ABNORMAL HIGH (ref 24–36)
aPTT: 78 seconds — ABNORMAL HIGH (ref 24–36)

## 2022-02-05 MED ORDER — HYDROCORTISONE ACETATE 25 MG RE SUPP: 25.0000 mg | Freq: Two times a day (BID) | RECTAL | Status: DC | PRN

## 2022-02-05 MED ORDER — FAMOTIDINE IN NACL 20-0.9 MG/50ML-% IV SOLN
20.0000 mg | Freq: Two times a day (BID) | INTRAVENOUS | Status: DC
  Administered 2022-02-05 (×2): 20 mg via INTRAVENOUS
  Filled 2022-02-05 (×3): qty 50

## 2022-02-05 MED ORDER — SODIUM CHLORIDE 0.9 % IV SOLN: 10.0000 mg | Freq: Two times a day (BID) | INTRAVENOUS | Status: DC

## 2022-02-05 MED ORDER — SODIUM CHLORIDE 0.9 % IV SOLN
1.0000 mg | Freq: Once | INTRAVENOUS | Status: AC
  Administered 2022-02-05: 1 mg via INTRAVENOUS
  Filled 2022-02-05: qty 0.2

## 2022-02-05 MED ORDER — ASPIRIN 300 MG RE SUPP
300.0000 mg | Freq: Every day | RECTAL | Status: DC
  Administered 2022-02-05: 300 mg via RECTAL
  Filled 2022-02-05 (×2): qty 1

## 2022-02-05 MED ORDER — ASPIRIN 81 MG PO CHEW: 81.0000 mg | CHEWABLE_TABLET | Freq: Every day | ORAL | Status: DC

## 2022-02-05 MED ORDER — HEPARIN (PORCINE) 25000 UT/250ML-% IV SOLN
950.0000 [IU]/h | INTRAVENOUS | Status: DC
  Administered 2022-02-05: 850 [IU]/h via INTRAVENOUS
  Filled 2022-02-05: qty 250

## 2022-02-05 NOTE — Assessment & Plan Note (Signed)
a1c 4.1, requiring d10 at this time - last several A1c's not in diabetic or prediabetic range

## 2022-02-05 NOTE — Assessment & Plan Note (Addendum)
Planning for comfort measures after discussion by nephrology/palliative care Passed away 2023-02-16 AM

## 2022-02-05 NOTE — Progress Notes (Signed)
ANTICOAGULATION CONSULT NOTE - Initial Consult  Pharmacy Consult for Apixaban >> Heparin Indication:  History of LV thrombus  Allergies  Allergen Reactions   Lactose Intolerance (Gi) Other (See Comments)    Upset stomach    Patient Measurements: Height: 5\' 9"  (175.3 cm) Weight: 53.6 kg (118 lb 2.7 oz) IBW/kg (Calculated) : 70.7 Heparin Dosing Weight: 53.6 kg  Vital Signs: Temp: 97.7 F (36.5 C) (07/01 1148) Temp Source: Axillary (07/01 1148) BP: 114/69 (07/01 1148) Pulse Rate: 86 (07/01 1148)  Labs: Recent Labs    01/24/2022 1220 01/19/2022 1228 01/30/2022 1630 02/05/22 0824  HGB 11.6* 12.6*  12.6*  --  12.5*  HCT 34.0* 37.0*  37.0*  --  36.7*  PLT 81*  --   --  82*  APTT 62*  --   --   --   LABPROT 21.6*  --   --   --   INR 1.9*  --   --   --   CREATININE 8.46* 9.50*  --  5.40*  TROPONINIHS 41*  --  43*  --     Estimated Creatinine Clearance: 9.9 mL/min (A) (by C-G formula based on SCr of 5.4 mg/dL (H)).   Medical History: Past Medical History:  Diagnosis Date   CAD (coronary artery disease)    CHF (congestive heart failure) ([name])    Diabetes mellitus without complication ([name])    type 2   DM (diabetes mellitus) ([place])    ESRD (end stage renal disease) ([place])    dialysis Mon Wed Fri   FUO (fever of unknown origin) 09/17/2019   History of blood transfusion    History of failed kidney transplant 2015   right kidney transplant   Hyperlipidemia    Hypertension    Osteomyelitis ([name])    Peripheral arterial disease ([name])    PVD (peripheral vascular disease) ([name])    Stroke ([name])    Vascular dementia ([name])     Medications:  Scheduled:   acidophilus  2 capsule Oral TID   allopurinol  100 mg Oral QHS   apixaban  5 mg Oral BID   aspirin  81 mg Oral Daily   Or   aspirin  300 mg Rectal Daily   atorvastatin  40 mg Oral QHS   Chlorhexidine Gluconate Cloth  6 each Topical Q0600   Chlorhexidine Gluconate Cloth  6 each Topical Q0600   [START ON 2022/03/01]  doxercalciferol  4 mcg Intravenous Q M,W,F-HD   Gerhardt's butt cream  1 Application Topical QID   insulin aspart  0-6 Units Subcutaneous TID WC   lidocaine-prilocaine  1 Application Topical Once per day on Mon Wed Fri   methylPREDNISolone (SOLU-MEDROL) injection  40 mg Intravenous Q12H   Infusions:   dextrose 30 mL/hr at 01/10/2022 1917   famotidine (PEPCID) IV     folic acid 1 mg in sodium chloride 0.9 % 50 mL IVPB      Assessment: 71 yo M presenting with AMS, on apixaban PTA for history of LV thrombus. Last apixaban dose on 6/30 AM - per patient's spouse. Of note, patient was recently switched from apixaban 5 mg BID to 2.5 mg BID, ~2-3 weeks prior to this admission.   Per MD - patient to transition to heparin for now while unable to take PO medications. Pharmacy consulted for heparin dosing. Hgb 12.5, plt 82 - low. No signs/symptoms of bleeding noted.  Goal of Therapy:  Heparin level 0.3-0.7 units/ml aPTT 66-102 seconds Monitor platelets by anticoagulation protocol: Yes  Plan:  Stop apixaban. Start IV heparin infusion at 850 units/hr. Check baseline and ~8 hr heparin level, aPTT.  Daily CBC, heparin level, aPTT. Monitor for signs/symptoms of bleeding.   Vance Peper, PharmD PGY-2 Pharmacy Resident Phone [phone] 02/05/2022 1:41 PM   Please check AMION for all [place] phone numbers After 10:00 PM, call [place] [phone]

## 2022-02-05 NOTE — Hospital Course (Signed)
Jonathan Dollar Sr. is Jonathan [name] 71 y.o. 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

## 2022-02-05 NOTE — Assessment & Plan Note (Signed)
Appreciate neurology recs - stroke thought cardio embolic due to LV thormbus MRI motion degraded, 15 mm focus of restricted diffusion within L corona radiata suspicious for acute/subacute infarct, 6 mm acute/subacute infarct within posterior right frontal lobe white matter, 4 mm acute infarct within R occipital lobe Echo pending MRA head/neck pending PT/OT/SLP a1c 4.1, LDL pending

## 2022-02-05 NOTE — Assessment & Plan Note (Signed)
On L

## 2022-02-05 NOTE — Progress Notes (Signed)
[place] for Apixaban >> Heparin Indication:  History of LV thrombus , stroke  Allergies  Allergen Reactions   Lactose Intolerance (Gi) Other (See Comments)    Upset stomach    Patient Measurements: Height: 5\' 9"  (175.3 cm) Weight: 51.6 kg (113 lb 12.1 oz) IBW/kg (Calculated) : 70.7 Heparin Dosing Weight: 53.6 kg  Vital Signs: Temp: 97.9 F (36.6 C) (07/01 2110) Temp Source: Axillary (07/01 2110) BP: 131/92 (07/01 2110) Pulse Rate: 80 (07/01 2110)  Labs: Recent Labs    01/06/2022 1220 01/12/2022 1228 01/22/2022 1630 02/05/22 0824 02/05/22 1504 02/05/22 2247  HGB 11.6* 12.6*  12.6*  --  12.5*  --  13.5  HCT 34.0* 37.0*  37.0*  --  36.7*  --  38.5*  PLT 81*  --   --  82*  --  101*  APTT 62*  --   --   --  38* 78*  LABPROT 21.6*  --   --   --   --   --   INR 1.9*  --   --   --   --   --   HEPARINUNFRC  --   --   --   --  1.05* 1.08*  CREATININE 8.46* 9.50*  --  5.40*  --  3.92*  TROPONINIHS 41*  --  43*  --   --   --      Estimated Creatinine Clearance: 13.2 mL/min (A) (by C-G formula based on SCr of 3.92 mg/dL (H)).   Medical History: Past Medical History:  Diagnosis Date   CAD (coronary artery disease)    CHF (congestive heart failure) ([name])    Diabetes mellitus without complication ([name])    type 2   DM (diabetes mellitus) ([place])    ESRD (end stage renal disease) ([place])    dialysis Mon Wed Fri   FUO (fever of unknown origin) 09/17/2019   History of blood transfusion    History of failed kidney transplant 2015   right kidney transplant   Hyperlipidemia    Hypertension    Osteomyelitis ([name])    Peripheral arterial disease ([name])    PVD (peripheral vascular disease) ([name])    Stroke ([name])    Vascular dementia ([name])     Medications:  Scheduled:   acidophilus  2 capsule Oral TID   allopurinol  100 mg Oral QHS   aspirin  81 mg Oral Daily   Or   aspirin  300 mg Rectal Daily   atorvastatin  40 mg Oral QHS    Chlorhexidine Gluconate Cloth  6 each Topical Q0600   Chlorhexidine Gluconate Cloth  6 each Topical Q0600   [START ON 09-Mar-2022] doxercalciferol  4 mcg Intravenous Q M,W,F-HD   Gerhardt's butt cream  1 Application Topical QID   insulin aspart  0-6 Units Subcutaneous TID WC   lidocaine-prilocaine  1 Application Topical Once per day on Mon Wed Fri   methylPREDNISolone (SOLU-MEDROL) injection  40 mg Intravenous Q12H   Infusions:   dextrose 30 mL/hr at 02/02/2022 1917   famotidine (PEPCID) IV 20 mg (02/05/22 2126)   heparin 850 Units/hr (02/05/22 1553)    Assessment: 71 yo M presenting with AMS/stroke, on apixaban PTA for history of LV thrombus. Last apixaban dose on 6/30 AM - per patient's spouse. Of note, patient was recently switched from apixaban 5 mg BID to 2.5 mg BID, ~2-3 weeks prior to this admission.   Per MD - patient to transition to heparin  for now while unable to take PO medications. Pharmacy consulted for heparin dosing. Hgb 12.5, plt 82 - low. No signs/symptoms of bleeding noted.  7/1 PM update: aPTT therapeutic MRI + acute/subacute stroke  Goal of Therapy:  Heparin level 0.3-0.5 units/mL aPTT 66-84 secs Monitor platelets by anticoagulation protocol: Yes   Plan:  Cont heparin at 850 units/hr Confirmatory aPTT with AM labs  Narda Bonds, PharmD, [place] Pharmacist Phone: [phone]

## 2022-02-05 NOTE — Progress Notes (Signed)
[facility] Progress Note  Subjective: seen in room  Vitals:   02/05/22 0310 02/05/22 0400 02/05/22 0619 02/05/22 0843  BP:  130/83 134/83 133/79  Pulse:  86 87 87  Resp:  16 20 17   Temp:  98 F (36.7 C) 98.3 F (36.8 C) 98.2 F (36.8 C)  TempSrc:  Oral Oral Oral  SpO2:  100% 100% 96%  Weight: 53.6 kg       Exam: Gen lethargic, not responding to voice No rash, cyanosis  No jvd or bruits Chest clear bilat to bases, no rales/ wheezing RRR no RG Abd soft ntnd no mass or ascites +bs GU normal male MS bilat LE amputee Ext mild 1+ bilat  LE edema Neuro is somnolent, not following simple commands    RUA AVF + bruit    Home meds include - allopurinol, apixaban, aspirin, atorvastatin, famotidine, Boost, insulin aspart, nephrovite, prednisone 5 am, sevelamer 800 ac tid, prns/ vits/ supps        OP HD: [facility] MWF 4h 61min  400/1.5  50kg  2/2 bath  Heparin 3000  RUA AVF - mircera 100 q2, last 6/28 - hectorol 4 ug IV tiw - last full HD 6/23, post wt 50.7kg - only made 5 HD sessions in the month of June (usually 12)     Assessment/ Plan: AMS - CT negative, hx multiple prior CVA's. MRI here shows multiple acute CVAs. Neurology on board. ECHO ordered. .  Hyperkalemia - K+ 6.4, had HD overnight and K+ 4.8 this am.  ESRD - usual HD MWF. Misses HD, only had 5 sessions in month of June. Had HD last night here and will do HD again today.  BP/ volume - +early CHF by CXR. 1.9 L off w/ HD overnight. Plan 2 L goal w/ HD again today. 3kg up.  DNR - wife states pt declining "these last 5 yrs" does not want heroic measures. Prognosis is poor w/ multiple comorbidities and multiple new CVAs.  Anemia esrd - Hb 11- 12 here, no esa needs. Last esa was 6/28.  MBD ckd - CCa in range, add on phos. Cont binder, IV vdra w/ hd.  Vasc dementia/ hx prior CVA's PAD - bilat amputee DM - on insulin H/o failed renal transplant - remains on prednisone    Rob Tinsley Everman 02/05/2022, 11:16  AM   Recent Labs  Lab 01/07/2022 1220 01/10/2022 1228 02/05/22 0824  HGB 11.6* 12.6*  12.6* 12.5*  ALBUMIN 2.8*  --  3.0*  CALCIUM 8.9  --  9.2  CREATININE 8.46* 9.50* 5.40*  K 6.4* 6.1*  6.1* 4.8   No results for input(s): "IRON", "TIBC", "FERRITIN" in the last 168 hours. Inpatient medications:  acidophilus  2 capsule Oral TID   allopurinol  100 mg Oral QHS   apixaban  5 mg Oral BID   aspirin  81 mg Oral Daily   atorvastatin  40 mg Oral QHS   Chlorhexidine Gluconate Cloth  6 each Topical Q0600   Chlorhexidine Gluconate Cloth  6 each Topical Q0600   [START ON 02/15/22] doxercalciferol  4 mcg Intravenous Q M,W,F-HD   famotidine  20 mg Oral Daily   folic acid  1 mg Oral Daily   Gerhardt's butt cream  1 Application Topical QID   hydrocortisone  25 mg Rectal BID   insulin aspart  0-6 Units Subcutaneous TID WC   lidocaine-prilocaine  1 Application Topical Once per day on Mon Wed Fri   methylPREDNISolone (SOLU-MEDROL) injection  40  mg Intravenous Q12H   sevelamer carbonate  800 mg Oral TID WC   sodium zirconium cyclosilicate  10 g Oral Daily    dextrose 30 mL/hr at 01/15/2022 1917   acetaminophen, albuterol, feeding supplement, melatonin, polyethylene glycol

## 2022-02-05 NOTE — Assessment & Plan Note (Signed)
on eliquis prior to admission Transitioning to heparin gtt for now while unable to take PO

## 2022-02-05 NOTE — Progress Notes (Signed)
  Echocardiogram 2D Echocardiogram has been performed.  Jonathan [name] 02/05/2022, 2:58 PM

## 2022-02-05 NOTE — Assessment & Plan Note (Signed)
On R

## 2022-02-05 NOTE — Assessment & Plan Note (Signed)
Per renal

## 2022-02-05 NOTE — Progress Notes (Signed)
PROGRESS NOTE    [name]  ZOX:[id number] DOB: [birth year] DOA: 02/01/2022 PCP: Ginger Organ., MD  Chief Complaint  Patient presents with   Altered Mental Status    Brief Narrative:  Jonathan [name] Sr. is Jonathan [name] 71 y.o. 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    Assessment & Plan:   Principal Problem:   AMS (altered mental status) Active Problems:   Goals of care, counseling/discussion   Embolic stroke ([name])   Acute metabolic encephalopathy   Hyperkalemia   Acute on chronic combined systolic and diastolic CHF (congestive heart failure) ([name])   Hypoglycemia   Current chronic use of systemic steroids   Diabetes mellitus type 2, uncomplicated ([name])   ESRD on dialysis ([name])   LV (left ventricular) mural thrombus   S/P AKA (above knee amputation) ([name])   Amputation below knee ([name])   CHF (congestive heart failure) ([name])   Acute encephalopathy   Assessment and Plan: Goals of care, counseling/discussion Palliative c/s, additional goc conversations  Embolic stroke [facility]) Appreciate neurology recs - stroke thought cardio embolic due to LV thormbus MRI motion degraded, 15 mm focus of restricted diffusion within L corona radiata suspicious for acute/subacute infarct, 6 mm acute/subacute infarct within posterior right frontal lobe white matter, 4 mm acute infarct within R occipital lobe Echo pending MRA head/neck pending PT/OT/SLP a1c 4.1, LDL pending  Acute metabolic encephalopathy Related to acute stroke, missed dialysis sessions At baseline, per 08/2021 note, 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  Delirium precautions  Hyperkalemia Improved after dialysis  Acute on chronic combined systolic and diastolic CHF  (congestive heart failure) ([name]) Volume per renal  Echo pending  Current chronic use of systemic steroids Currently on stress dose steroids (at baseline was on steroids with hx renal transplant)   Hypoglycemia Continue d10 Follow SLP eval and ability to take/maintain PO   Diabetes mellitus type 2, uncomplicated ([name]) J4N 4.1, requiring d10 at this time - last several A1c's not in diabetic or prediabetic range  ESRD on dialysis [facility]) Per renal  LV (left ventricular) mural thrombus on eliquis prior to admission Transitioning to heparin gtt for now while unable to take PO  Amputation below knee ([place]) On L   S/P AKA (above knee amputation) ([place]) On R          DVT prophylaxis: heparin gtt Code Status: dnr Family Communication: wife at bedside Disposition:   Status is: Inpatient Remains inpatient appropriate because: additional goc conversations   Consultants:  Neurology renal  Procedures:  none  Antimicrobials:  Anti-infectives (From admission, onward)    None       Subjective: Groins intermittently, no speech  Objective: Vitals:   02/05/22 0619 02/05/22 0843 02/05/22 1148 02/05/22 1300  BP: 134/83 133/79 114/69   Pulse: 87 87 86   Resp: 20 17 16    Temp: 98.3 F (36.8 C) 98.2 F (36.8 C) 97.7 F (36.5 C)   TempSrc: Oral Oral Axillary   SpO2: 100% 96% 100%   Weight:    53.6 kg  Height:    5\' 9"  (1.753 m)    Intake/Output Summary (Last 24 hours) at 02/05/2022 1348 Last data filed at 02/05/2022 0700 Gross per 24 hour  Intake 351.08 ml  Output 1900  ml  Net -1548.92 ml   Filed Weights   02/03/2022 1133 02/05/22 0310 02/05/22 1300  Weight: 53.6 kg 53.6 kg 53.6 kg    Examination:  General exam: Appears calm and comfortable  Respiratory system: unlabored Cardiovascular system: RRR Gastrointestinal system: Abdomen is nondistended, soft and nontender. Central nervous system: opens eyes to loud voice/physical stimuli - doesn't follow commands  or consistently move extremities - asymmetric facies Extremities: no LEE    Data Reviewed: I have personally reviewed following labs and imaging studies  CBC: Recent Labs  Lab 02/02/2022 1220 02/03/2022 1228 02/05/22 0824  WBC 4.8  --  5.3  NEUTROABS 4.1  --   --   HGB 11.6* 12.6*  12.6* 12.5*  HCT 34.0* 37.0*  37.0* 36.7*  MCV 104.3*  --  100.3*  PLT 81*  --  82*    Basic Metabolic Panel: Recent Labs  Lab 01/23/2022 1220 01/18/2022 1228 01/21/2022 2231 02/05/22 0824  NA 137 132*  132*  --  136  K 6.4* 6.1*  6.1*  --  4.8  CL 94* 96*  --  93*  CO2 23  --   --  24  GLUCOSE 191* 180* 107* 134*  BUN 143* >130*  --  74*  CREATININE 8.46* 9.50*  --  5.40*  CALCIUM 8.9  --   --  9.2    GFR: Estimated Creatinine Clearance: 9.9 mL/min (Adream Parzych) (by C-G formula based on SCr of 5.4 mg/dL (H)).  Liver Function Tests: Recent Labs  Lab 01/20/2022 1220 02/05/22 0824  AST 57* 36  ALT 70* 66*  ALKPHOS 172* 184*  BILITOT 1.1 1.3*  PROT 5.1* 5.6*  ALBUMIN 2.8* 3.0*    CBG: Recent Labs  Lab 01/23/2022 1129 01/22/2022 1458 01/22/2022 2029 02/05/22 0639 02/05/22 1246  GLUCAP 65* 102* 107* 111* 141*     Recent Results (from the past 240 hour(s))  Resp Panel by RT-PCR (Flu Samarra Ridgely&B, Covid) Anterior Nasal Swab     Status: None   Collection Time: 01/12/2022 12:20 PM   Specimen: Anterior Nasal Swab  Result Value Ref Range Status   SARS Coronavirus 2 by RT PCR NEGATIVE NEGATIVE Final    Comment: (NOTE) SARS-CoV-2 target nucleic acids are NOT DETECTED.  The SARS-CoV-2 RNA is generally detectable in upper respiratory specimens during the acute phase of infection. The lowest concentration of SARS-CoV-2 viral copies this assay can detect is 138 copies/mL. [place] Moyd negative result does not preclude SARS-Cov-2 infection and should not be used as the sole basis for treatment or other patient management decisions. Cory Rama negative result may occur with  improper specimen collection/handling, submission of  specimen other than nasopharyngeal swab, presence of viral mutation(s) within the areas targeted by this assay, and inadequate number of viral copies(<138 copies/mL). Raford Brissett negative result must be combined with clinical observations, patient history, and epidemiological information. The expected result is Negative.  Fact Sheet for Patients:  EntrepreneurPulse.com.au  Fact Sheet for Healthcare Providers:  IncredibleEmployment.be  This test is no t yet approved or cleared by the [place] FDA and  has been authorized for detection and/or diagnosis of SARS-CoV-2 by FDA under an Emergency Use Authorization (EUA). This EUA will remain  in effect (meaning this test can be used) for the duration of the COVID-19 declaration under Section 564(b)(1) of the Act, 21 U.S.C.section 360bbb-3(b)(1), unless the authorization is terminated  or revoked sooner.       Influenza Jabori Henegar by PCR NEGATIVE NEGATIVE Final   Influenza B by PCR  NEGATIVE NEGATIVE Final    Comment: (NOTE) The Xpert Xpress SARS-CoV-2/FLU/RSV plus assay is intended as an aid in the diagnosis of influenza from Nasopharyngeal swab specimens and should not be used as Amedee Cerrone sole basis for treatment. Nasal washings and aspirates are unacceptable for Xpert Xpress SARS-CoV-2/FLU/RSV testing.  Fact Sheet for Patients: EntrepreneurPulse.com.au  Fact Sheet for Healthcare Providers: IncredibleEmployment.be  This test is not yet approved or cleared by the [place] FDA and has been authorized for detection and/or diagnosis of SARS-CoV-2 by FDA under an Emergency Use Authorization (EUA). This EUA will remain in effect (meaning this test can be used) for the duration of the COVID-19 declaration under Section 564(b)(1) of the Act, 21 U.S.C. section 360bbb-3(b)(1), unless the authorization is terminated or revoked.  Performed at [facility], [place] [address],  [place], [place] [address]          Radiology Studies: MR BRAIN WO CONTRAST  Result Date: 01/10/2022 CLINICAL DATA:  Provided history: Stroke, follow-up. EXAM: MRI HEAD WITHOUT CONTRAST TECHNIQUE: Multiplanar, multiecho pulse sequences of the brain and surrounding structures were obtained without intravenous contrast. COMPARISON:  Prior head CT examinations 01/20/2022 and earlier. Brain MRI 07/31/2019. FINDINGS: Brain: Moderate to moderately advanced generalized cerebral atrophy. Comparatively mild cerebellar atrophy. 6 mm focus of restricted diffusion within the posterior right frontal lobe white matter compatible with an acute/subacute (series 2, image 32) 4 mm acute infarct within the right occipital lobe (series 2, image 19). 15 mm focus of apparent subtle restricted diffusion within the left corona radiata suspicious for an acute/subacute infarct (series 2, image 28) (series 250, image 28). Moderate-sized chronic cortical/subcortical left infarct within the left frontal lobe/anterior left insula. Chronic lacunar infarcts within the bilateral cerebral hemispheric white matter, bilateral deep gray nuclei, corpus callosum and within the pons. Background advanced patchy and confluent T2 FLAIR hyperintensity are abnormality within the cerebral white matter and pons, nonspecific but compatible with chronic small vessel ischemic disease. No evidence of an intracranial mass. No chronic intracranial blood products. No extra-axial fluid collection. No midline shift. Vascular: Maintained flow voids within the proximal large arterial vessels. Skull and upper cervical spine: No focal suspicious marrow lesion. Nonspecific reversal of the expected cervical lordosis. Mild C3-C4 and C4-C5 grade 1 retrolisthesis. Incompletely assessed cervical spondylosis. Sinuses/Orbits: No acute orbital finding. Prior bilateral ocular lens replacement. 8 mm cystic focus lateral to the left globe, possibly within the lacrimal gland,  unchanged from the prior brain MRI of 07/31/2019. Mild mucosal thickening within the right frontal and anterior right ethmoid sinuses. 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 Trace fluid within the bilateral mastoid air cells. IMPRESSION: 1. Mildly motion degraded exam. 2. 15 mm focus of apparent subtle restricted diffusion within the left corona radiata suspicious for an acute/subacute infarct. 3. 6 mm acute/subacute infarct within the posterior right frontal lobe white matter. 4. 4 mm acute infarct within the right occipital lobe. 5. Background parenchymal atrophy, advanced chronic small vessel ischemic disease and chronic infarcts as detailed and similar to the prior brain MRI of 07/31/2019. Electronically Signed   By: Kellie Simmering D.O.   On: 01/25/2022 14:58   DG Chest Portable 1 View  Result Date: 01/17/2022 CLINICAL DATA:  Cough, difficulty breathing EXAM: PORTABLE CHEST 1 VIEW COMPARISON:  Previous studies including the examination of 09/01/2021 FINDINGS: Transverse diameter of heart is increased. Central pulmonary vessels are prominent. Increased interstitial markings are seen in  the parahilar regions and lower lung fields. Small bilateral pleural effusions are seen, more so on the right side. There is no pneumothorax. Vascular stent is noted in the course of right axillary and subclavian vessels. IMPRESSION: Cardiomegaly. Central pulmonary vessels are prominent suggesting CHF. Increased interstitial markings in both lungs suggest pulmonary edema. Small bilateral pleural effusions, more so on the right side. Electronically Signed   By: Elmer Picker M.D.   On: 01/07/2022 12:48   CT HEAD CODE STROKE WO CONTRAST  Result Date: 01/12/2022 CLINICAL DATA:  Code stroke.  Neuro deficit, acute, stroke suspected EXAM: CT HEAD WITHOUT CONTRAST TECHNIQUE: Contiguous axial images were obtained from the base of the  skull through the vertex without intravenous contrast. RADIATION DOSE REDUCTION: This exam was performed according to the departmental dose-optimization program which includes automated exposure control, adjustment of the mA and/or kV according to patient size and/or use of iterative reconstruction technique. COMPARISON:  12/03/2021 FINDINGS: Brain: No acute intracranial hemorrhage, mass effect, or edema. Chronic left frontal infarct. Wallerian degeneration along the left cerebral peduncle. Additional patchy and confluent hypoattenuation in the supratentorial white matter is nonspecific but probably reflects stable chronic microvascular ischemic changes. Probable superimposed chronic small vessel infarcts. Prominence of the ventricles and sulci reflects stable parenchymal volume loss. No extra-axial collection. Vascular: No hyperdense vessel. Atherosclerotic calcification at the skull base. Extensive calcified external carotid artery branches. Skull: Unremarkable. Sinuses/Orbits: No acute finding. Other: Mastoid air cells are clear.  Right occipital scalp lipoma ASPECTS ([place] Stroke Program Early CT Score) - Ganglionic level infarction (caudate, lentiform nuclei, internal capsule, insula, M1-M3 cortex): 7 - Supraganglionic infarction (M4-M6 cortex): 3 Total score (0-10 with 10 being normal): 10 IMPRESSION: There is no acute intracranial hemorrhage or evidence of acute infarction. ASPECT score is 10. No significant change since April 2023 examination. These results were communicated to Dr. Cheral Marker at 11:41 am on 01/11/2022 by text page via the [facility] messaging system. Electronically Signed   By: Macy Mis M.D.   On: 01/28/2022 11:43        Scheduled Meds:  acidophilus  2 capsule Oral TID   allopurinol  100 mg Oral QHS   apixaban  5 mg Oral BID   aspirin  81 mg Oral Daily   Or   aspirin  300 mg Rectal Daily   atorvastatin  40 mg Oral QHS   Chlorhexidine Gluconate Cloth  6 each Topical Q0600    Chlorhexidine Gluconate Cloth  6 each Topical Q0600   [START ON 08-Feb-2022] doxercalciferol  4 mcg Intravenous Q M,W,F-HD   Gerhardt's butt cream  1 Application Topical QID   insulin aspart  0-6 Units Subcutaneous TID WC   lidocaine-prilocaine  1 Application Topical Once per day on Mon Wed Fri   methylPREDNISolone (SOLU-MEDROL) injection  40 mg Intravenous Q12H   Continuous Infusions:  dextrose 30 mL/hr at 01/14/2022 1917   famotidine (PEPCID) IV     folic acid 1 mg in sodium chloride 0.9 % 50 mL IVPB       LOS: 1 day    Time spent: over 30 min    Fayrene Helper, MD [facility]   To contact the attending provider between 7A-7P or the covering provider during after hours 7P-7A, please log into the web site [email or web address] and access using universal [place] password for that web site. If you do not have the password, please call the hospital operator.  02/05/2022, 1:48 PM

## 2022-02-05 NOTE — Assessment & Plan Note (Addendum)
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

## 2022-02-05 NOTE — Consult Note (Signed)
Palliative Medicine Inpatient Consult Note  Consulting Provider: Lequita Halt, MD  Reason for consult:   Jonathan [name] Palliative Medicine Consult  Reason for Consult? Multiple acute strokes. Nephro made patient DNR   02/05/2022  HPI:  Per intake H&P --> Jonathan L Wrede Sr. is a 71 y.o. 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  Palliative care has been consulted in the setting of multiple strokes to further discuss goals of care.  Clinical Assessment/Goals of Care:  *Please note that this is a verbal dictation therefore any spelling or grammatical errors are due to the "[employer]" system interpretation.  I have reviewed medical records including EPIC notes, labs and imaging, received report from bedside RN, assessed the patient.    I met with patient's spouse, Jonathan [name] to further discuss diagnosis prognosis, Jonathan [name], EOL wishes, disposition and options.   I introduced Palliative Medicine as specialized medical care for people living with serious illness. It focuses on providing relief from the symptoms and stress of a serious illness. The goal is to improve quality of life for both the patient and the family.  Medical History Review and Understanding:  [name] I reviewed Jonathan [name]'s history of heart failure, diabetes, hypertension, bilateral above-the-knee amputations, and end-stage renal disease.  Reviewed reason for admission inclusive of embolic stroke.  Social History:  Jonathan [name] is from [place] originally.  He and his wife have been married now for 42 years.  They share 2 children together and Jonathan [name] has 2 children from his prior relationship.  They have a total of 6 grandchildren.  Jonathan [name] worked for [employer] where he  retired from.  He is a man who loved all sports, traveling, and taking long car rides.  He is a spiritual man but does not ascribe to any specific denomination of belief system.  Functional and Nutritional State:  Jonathan [name] shares that she performs all BADLs and IADLs for Jonathan [name].  She shares that she hoist him into a wheelchair to get around.  Prior to admission Jonathan [name] was able to self feed though that was for the most part all he could do for himself.  Advance Directives:  Patient's wife, Jonathan [name] is his primary Media planner.  They do not have advanced directives completed.  Code Status:  Jonathan [name] is an established DNAR/DNI CODE STATUS.  Discussion:  [name] I reviewed that over the past few years Cotey's condition has declined significantly.  She expresses that he had broken his hip a while back which caused a decline and also the loss of his bilateral lower extremities in the last year caused a significant decline.  Jonathan [name] shares running with such an independent man and always "on the go".  She shares that the loss of first his license due to his dementia and now his limbs has left him with nothing to look forward to.  We reviewed that Jonathan [name] has suffered from multiple CVAs contributing to his overall lack of function.  We reviewed that Jonathan [name] is at a point now where he has frequent loose bowel movements causing him to not be able to go to dialysis.  We further discussed that dialysis is 1 of those interventions that in some cases is no longer worth pursuing if all other organ systems are failing.  I shared very  honestly with Jonathan [name] that this may be the situation in [name]'s case -most especially in the setting of this recent embolic stroke inhibiting patient from being coherent enough to participate in eating/drinking.  We reviewed the ideals and focuses of hospice care.I described hospice as a service for patients who have a life expectancy of 6 months or less. The goal of hospice is  the preservation of dignity and quality at the end phases of life. Under hospice care, the focus changes from curative to symptom relief.   I shared that in Ascencion's case this time would be limited to 7 to 10 days roughly if dialysis were stopped.  We further discussed what comfort oriented care would look like and only focusing on symptom management as opposed to curative treatment.  Jonathan [name] has shared she wants to take some time to think about this information though she was going to meet with Dr. Jonnie [name] in the morning to carry on these conversations either way.  I asked if I could join him which she is open to.  Discussed the importance of continued conversation with family and their  medical providers regarding overall plan of care and treatment options, ensuring decisions are within the context of the patients values and GOCs.  Decision Maker: [name], Jonathan (Spouse): [phone] (Mobile)  SUMMARY OF RECOMMENDATIONS   DNAR/DNI  Continue current care  Discussed patient's overall clinical decline within the last year  Reviewed the ideals and values of hospice care  Plan to meet patient's wife, Jonathan [name] alongside Dr. Jonnie [name] tomorrow morning at [place] to further discuss whether or not to continue with hemodialysis  Ongoing palliative support during admission  Code Status/Advance Care Planning: DNAR/DNI   Palliative Prophylaxis:  Aspiration, Bowel Regimen, Delirium Protocol, Frequent Pain Assessment, Oral Care, Palliative Wound Care, and Turn Reposition  Additional Recommendations (Limitations, Scope, Preferences): Continue current scope of care  Psycho-social/Spiritual:  Desire for further Chaplaincy support: Yes Additional Recommendations: Education on end-stage renal disease.   Prognosis: Exceptionally poor overall, hospice would be more than appropriate at this juncture.  Discharge Planning: Discharge plan uncertain  Vitals:   70/01/74 0843 02/05/22 1148  BP: 133/79  114/69  Pulse: 87 86  Resp: 17 16  Temp: 98.2 F (36.8 C) 97.7 F (36.5 C)  SpO2: 96% 100%    Intake/Output Summary (Last 24 hours) at 02/05/2022 1507 Last data filed at 02/05/2022 0700 Gross per 24 hour  Intake 351.08 ml  Output 1900 ml  Net -1548.92 ml   Last Weight  Most recent update: 02/05/2022  1:41 PM    Weight  53.6 kg (118 lb 2.7 oz)            Gen: Elderly African-American male HEENT: Dry mucous membranes CV: Regular rate and rhythm PULM: On room air ABD: soft/nontender EXT: Bilateral AKA's Neuro: Somnolent  PPS: 10%   This conversation/these recommendations were discussed with patient primary care team, Dr. Florene Glen  Billing based on MDM: High  Problems Addressed: One acute or chronic illness or injury that poses a threat to life or bodily function  Amount and/or Complexity of Data: Category 3:Discussion of management or test interpretation with external physician/other qualified health care professional/appropriate source (not separately reported)  Risks: Decision not to resuscitate or to de-escalate care because of poor prognosis ______________________________________________________ [place] Team Team Cell Phone: [phone] Please utilize secure chat with additional questions, if there is no response within 30 minutes please call the above phone number  Palliative Medicine Team  providers are available by phone from 7am to 7pm daily and can be reached through the team cell phone.  Should this patient require assistance outside of these hours, please call the patient's attending physician.

## 2022-02-05 NOTE — Progress Notes (Addendum)
NEUROLOGY TEAM PROGRESS NOTE   INTERVAL HISTORY His wife  is at the bedside.  Patient is laying in bed in NAD. Patient will open eyes to loud voice, he will not follow commands. He is not speaking, will occasionally give a groan. Received HD this am, removed 2L off, unable to remove more due to labile BP. Per wife, may have another treatment depending on his BP    Vitals:   02/05/22 0310 02/05/22 0400 02/05/22 0619 02/05/22 0843  BP:  130/83 134/83 133/79  Pulse:  86 87 87  Resp:  16 20 17   Temp:  98 F (36.7 C) 98.3 F (36.8 C) 98.2 F (36.8 C)  TempSrc:  Oral Oral Oral  SpO2:  100% 100% 96%  Weight: 53.6 kg      CBC:  Recent Labs  Lab 01/29/2022 1220 01/25/2022 1228  WBC 4.8  --   NEUTROABS 4.1  --   HGB 11.6* 12.6*  12.6*  HCT 34.0* 37.0*  37.0*  MCV 104.3*  --   PLT 81*  --    Basic Metabolic Panel:  Recent Labs  Lab 01/15/2022 1220 01/20/2022 1228 01/15/2022 2231 02/05/22 0824  NA 137 132*  132*  --  136  K 6.4* 6.1*  6.1*  --  4.8  CL 94* 96*  --  93*  CO2 23  --   --  24  GLUCOSE 191* 180* 107* 134*  BUN 143* >130*  --  74*  CREATININE 8.46* 9.50*  --  5.40*  CALCIUM 8.9  --   --  9.2    Alcohol Level  Recent Labs  Lab 01/28/2022 1220  ETH <10    IMAGING past 24 hours MR BRAIN WO CONTRAST  Result Date: 01/21/2022 CLINICAL DATA:  Provided history: Stroke, follow-up. EXAM: MRI HEAD WITHOUT CONTRAST TECHNIQUE: Multiplanar, multiecho pulse sequences of the brain and surrounding structures were obtained without intravenous contrast. COMPARISON:  Prior head CT examinations 01/30/2022 and earlier. Brain MRI 07/31/2019. FINDINGS: Brain: Moderate to moderately advanced generalized cerebral atrophy. Comparatively mild cerebellar atrophy. 6 mm focus of restricted diffusion within the posterior right frontal lobe white matter compatible with an acute/subacute (series 2, image 32) 4 mm acute infarct within the right occipital lobe (series 2, image 19). 15 mm focus of  apparent subtle restricted diffusion within the left corona radiata suspicious for an acute/subacute infarct (series 2, image 28) (series 250, image 28). Moderate-sized chronic cortical/subcortical left infarct within the left frontal lobe/anterior left insula. Chronic lacunar infarcts within the bilateral cerebral hemispheric white matter, bilateral deep gray nuclei, corpus callosum and within the pons. Background advanced patchy and confluent T2 FLAIR hyperintensity are abnormality within the cerebral white matter and pons, nonspecific but compatible with chronic small vessel ischemic disease. No evidence of an intracranial mass. No chronic intracranial blood products. No extra-axial fluid collection. No midline shift. Vascular: Maintained flow voids within the proximal large arterial vessels. Skull and upper cervical spine: No focal suspicious marrow lesion. Nonspecific reversal of the expected cervical lordosis. Mild C3-C4 and C4-C5 grade 1 retrolisthesis. Incompletely assessed cervical spondylosis. Sinuses/Orbits: No acute orbital finding. Prior bilateral ocular lens replacement. 8 mm cystic focus lateral to the left globe, possibly within the lacrimal gland, unchanged from the prior brain MRI of 07/31/2019. Mild mucosal thickening within the right frontal and anterior right ethmoid sinuses. 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 Trace fluid within the bilateral mastoid air  cells. IMPRESSION: 1. Mildly motion degraded exam. 2. 15 mm focus of apparent subtle restricted diffusion within the left corona radiata suspicious for an acute/subacute infarct. 3. 6 mm acute/subacute infarct within the posterior right frontal lobe white matter. 4. 4 mm acute infarct within the right occipital lobe. 5. Background parenchymal atrophy, advanced chronic small vessel ischemic disease and chronic infarcts as detailed and similar to the  prior brain MRI of 07/31/2019. Electronically Signed   By: Kellie Simmering D.O.   On: 01/29/2022 14:58   DG Chest Portable 1 View  Result Date: 01/08/2022 CLINICAL DATA:  Cough, difficulty breathing EXAM: PORTABLE CHEST 1 VIEW COMPARISON:  Previous studies including the examination of 09/01/2021 FINDINGS: Transverse diameter of heart is increased. Central pulmonary vessels are prominent. Increased interstitial markings are seen in the parahilar regions and lower lung fields. Small bilateral pleural effusions are seen, more so on the right side. There is no pneumothorax. Vascular stent is noted in the course of right axillary and subclavian vessels. IMPRESSION: Cardiomegaly. Central pulmonary vessels are prominent suggesting CHF. Increased interstitial markings in both lungs suggest pulmonary edema. Small bilateral pleural effusions, more so on the right side. Electronically Signed   By: Elmer Picker M.D.   On: 01/25/2022 12:48   CT HEAD CODE STROKE WO CONTRAST  Result Date: 01/15/2022 CLINICAL DATA:  Code stroke.  Neuro deficit, acute, stroke suspected EXAM: CT HEAD WITHOUT CONTRAST TECHNIQUE: Contiguous axial images were obtained from the base of the skull through the vertex without intravenous contrast. RADIATION DOSE REDUCTION: This exam was performed according to the departmental dose-optimization program which includes automated exposure control, adjustment of the mA and/or kV according to patient size and/or use of iterative reconstruction technique. COMPARISON:  12/03/2021 FINDINGS: Brain: No acute intracranial hemorrhage, mass effect, or edema. Chronic left frontal infarct. Wallerian degeneration along the left cerebral peduncle. Additional patchy and confluent hypoattenuation in the supratentorial white matter is nonspecific but probably reflects stable chronic microvascular ischemic changes. Probable superimposed chronic small vessel infarcts. Prominence of the ventricles and sulci reflects  stable parenchymal volume loss. No extra-axial collection. Vascular: No hyperdense vessel. Atherosclerotic calcification at the skull base. Extensive calcified external carotid artery branches. Skull: Unremarkable. Sinuses/Orbits: No acute finding. Other: Mastoid air cells are clear.  Right occipital scalp lipoma ASPECTS (Taylor Stroke Program Early CT Score) - Ganglionic level infarction (caudate, lentiform nuclei, internal capsule, insula, M1-M3 cortex): 7 - Supraganglionic infarction (M4-M6 cortex): 3 Total score (0-10 with 10 being normal): 10 IMPRESSION: There is no acute intracranial hemorrhage or evidence of acute infarction. ASPECT score is 10. No significant change since April 2023 examination. These results were communicated to Dr. Cheral Marker at 11:41 am on 01/17/2022 by text page via the [facility] messaging system. Electronically Signed   By: Macy Mis M.D.   On: 01/10/2022 11:43    PHYSICAL EXAM GENERAL: lethargic,  critically ill elderly gentleman HEENT: - Normocephalic and atraumatic, dry mm LUNGS - Clear to auscultation bilaterally with no wheezes CV - S1S2 RRR, no m/r/g, equal pulses bilaterally. ABDOMEN - Soft, nontender, nondistended with normoactive BS Ext: warm, well perfused, intact peripheral pulses, no edema. Right BKA, Left AKA   NEURO:  Mental Status: arouses to loud voice. He does not speak or answer questions. Occasionally, will give a groan. He is not following commands. Cranial Nerves: PERRL 2 mm/brisk. EOMI, visual fields full to threat, right facial asymmetry, facial sensation intact, hearing intact, tongue/uvula/soft palate midline Motor: withdraws to noxious stimuli Tone: is decreased  Sensation- appears to be symmetric  Coordination: unable to assess due to AMS Gait- deferred  ASSESSMENT/PLAN [name] Sr. is a 71 y.o. 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 Per wife, Jonathan [name], she states LKW on Wednesday. Yesterday he was very lethargic and did not want to do anything. Today she woke him up and noticed he was drooling. Per EMS, while at HD center staff noticed him to be "not himself" and with facial droop. Of note, he has not had HD since last Friday. Also has had diarrhea for the last month or so. At baseline, patient does not speak a lot, but usually can verbalize his name and birth date. He is wheelchair bound and wife is his primary caretaker, he will feed himself. On arrival to the ED his CBG was 65. CT head with no acute process.    Stroke: MRI brain reveals multiple bilateral ischemic infarcts; acute /subacute left corona radiata infarct, acute/subacute right frontal ischemic infarct, and right occipital acute ischemic infarct Etiology:  Likely cardioembolic due to LV thrombus  Code Stroke CT head No acute abnormality. ASPECTS 10.    MRI   1. Mildly motion degraded exam. 2. 15 mm focus of apparent subtle restricted diffusion within the left corona radiata suspicious for an acute/subacute infarct. 3. 6 mm acute/subacute infarct within the posterior right frontal lobe white matter. 4. 4 mm acute infarct within the right occipital lobe. 5. Background parenchymal atrophy, advanced chronic small vessel ischemic disease and chronic infarcts  MRA head/neck ordered  2D Echo ordered LDL pending  HgbA1c pending VTE prophylaxis - Eliquis and 81mg  ASA  Diet: NPO time specified aspirin 81 mg daily and Eliquis BID  prior to admission, now on aspirin 81 mg daily and Eliquis BID .  Therapy recommendations:  pending  Disposition:  pending   Hypertension Home meds:  none Stable Permissive hypertension (OK if < 220/120) but gradually normalize in 5-7 days Long-term BP goal normotensive  Hyperlipidemia Home meds:  atorvastatin 40mg , resumed in  hospital LDL pending goal < 70 Continue statin at discharge  Diabetes type II Controlled Home meds:  insulin HgbA1c pending, goal < 7.0 CBGs Recent Labs    01/17/2022 1458 01/07/2022 2029 02/05/22 0639  GLUCAP 102* 107* 111*    SSI  Other Stroke Risk Factors Advanced Age >/= 57  Hx of Stroke  Coronary artery disease   Other Active Problems ESRD on HD M,W F CHF  Vascular dementia   Hospital day # [place] DNP, [id number]   Electronically signed: Dr. Kerney Elbe

## 2022-02-05 NOTE — Assessment & Plan Note (Signed)
Improved after dialysis

## 2022-02-05 NOTE — Evaluation (Signed)
Occupational Therapy Evaluation Patient Details Name: Jonathan BRANDIS Sr. MRN: [id number] DOB: [birth year] Today's Date: 02/05/2022   History of Present Illness Jonathan [name]. is a 71 y.o. male sent from HD center for evaluation of sudden onset of drooling and altered mentations. Imaging revealed possible acute R frontal infarct. 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   Clinical Impression   Pt dependent on spouse at baseline for ADLs and transfers, however spouse reports pt is occasionally able to feed self. Pt currently max-total A +2 for all ADLs and bed mobility, with little active movement noted in LUE, RUE edematous. Pt with L gaze preference, can track to R side inconsistently, <10% command following noted this session. Pt presenting with impairments listed below, will follow acutely.      Recommendations for follow up therapy are one component of a multi-disciplinary discharge planning process, led by the attending physician.  Recommendations may be updated based on patient status, additional functional criteria and insurance authorization.   Follow Up Recommendations  No OT follow up (per spouse's wishes, wanting palliative care involved)    Assistance Recommended at Discharge Frequent or constant Supervision/Assistance  Patient can return home with the following A lot of help with bathing/dressing/bathroom;Assistance with cooking/housework;Assistance with feeding;Direct supervision/assist for financial management;Direct supervision/assist for medications management;Assist for transportation;Help with stairs or ramp for entrance;Two people to help with walking and/or transfers    Functional Status Assessment  Patient has had a recent decline in their functional status and demonstrates the ability to make significant improvements in function in a reasonable and predictable amount of  time.  Equipment Recommendations  Hospital bed    Recommendations for Other Services PT consult     Precautions / Restrictions Precautions Precautions: Fall Precaution Comments: L AKA, R BKA Restrictions Weight Bearing Restrictions: No      Mobility Bed Mobility Overal bed mobility: Needs Assistance Bed Mobility: Supine to Sit, Sit to Supine     Supine to sit: Total assist, +2 for physical assistance, HOB elevated Sit to supine: Total assist, +2 for physical assistance   General bed mobility comments: retropulsive, causing self to slide forward at EOB requiring need to return to supine, sat up during second attempt with HOB elevated in upright position    Transfers                   General transfer comment: deferred      Balance Overall balance assessment: Needs assistance Sitting-balance support: Feet unsupported, Bilateral upper extremity supported Sitting balance-Leahy Scale: Zero Sitting balance - Comments: dependent on external support                                   ADL either performed or assessed with clinical judgement   ADL Overall ADL's : Needs assistance/impaired Eating/Feeding: Maximal assistance;Bed level   Grooming: Total assistance;Bed level   Upper Body Bathing: Total assistance;Bed level   Lower Body Bathing: Total assistance;Bed level   Upper Body Dressing : Total assistance;Bed level   Lower Body Dressing: Total assistance;Bed level   Toilet Transfer: Total assistance;+2 for safety/equipment           Functional mobility during ADLs: Total assistance;+2 for physical assistance       Vision   Vision Assessment?: Vision impaired- to be further  tested in functional context Additional Comments: L gaze preference, looks to R inconsistently when cued will further assess     Perception     Praxis      Pertinent Vitals/Pain Pain Assessment Pain Assessment: Faces Pain Score: 0-No pain Pain  Intervention(s): Monitored during session     Hand Dominance Right   Extremity/Trunk Assessment Upper Extremity Assessment Upper Extremity Assessment: RUE deficits/detail;LUE deficits/detail;Difficult to assess due to impaired cognition RUE Deficits / Details: No AROM noted, edematous -- per wife was this way prior to admission, will further assess LUE Deficits / Details: spontaneous movement noted in LUE when sitting EOB, able to use LUE very minimally to push/prop self up from mattress LUE Coordination: decreased fine motor;decreased gross motor   Lower Extremity Assessment Lower Extremity Assessment: Defer to PT evaluation   Cervical / Trunk Assessment Cervical / Trunk Assessment: Kyphotic   Communication Communication Communication: Expressive difficulties   Cognition Arousal/Alertness: Awake/alert (pt nonverbal, does make brief eye contact when cued) Behavior During Therapy: Flat affect Overall Cognitive Status: History of cognitive impairments - at baseline                                 General Comments: pt with flat affect, non-verbal, only perks up to name, <10% command follow, blank stare, unure of pt's ability to see     General Comments  VSS on RA    Exercises     Shoulder Instructions      Home Living Family/patient expects to be discharged to:: Private residence Living Arrangements: Spouse/significant other;Children Available Help at Discharge: Family;Available 24 hours/day Type of Home: House Home Access: Stairs to enter [employer] of Steps: 1 Entrance Stairs-Rails: None Home Layout: Two level;1/2 bath on main level;Able to live on main level with bedroom/bathroom Alternate Level Stairs-Number of Steps: 12 Alternate Level Stairs-Rails: Right Bathroom Shower/Tub: Sponge bathes at baseline   Bathroom Toilet: Standard Bathroom Accessibility: Yes   Home Equipment: Conservation officer, nature (2 wheels);Rollator (4 wheels);Cane - single  point;Wheelchair - Hydrologist Comments: dialysis MWF - [facility] transportation      Prior Functioning/Environment Prior Level of Function : Needs assist             Mobility Comments: wife reports she does everything for him with exception of feeding himself, pt reports despite having slide board and hoyer lift pt just transfers him to/from w/c due to his inabiligy to comprehend using slide board ADLs Comments: wife completes all ADLs, pt will attempt to feed self        OT Problem List: Decreased strength;Decreased range of motion;Decreased activity tolerance;Impaired balance (sitting and/or standing);Impaired vision/perception;Decreased coordination;Decreased cognition;Decreased safety awareness;Decreased knowledge of use of DME or AE;Impaired sensation;Impaired UE functional use;Increased edema      OT Treatment/Interventions: Self-care/ADL training;Therapeutic exercise;Therapeutic activities;Patient/family education;Balance training;Cognitive remediation/compensation;Visual/perceptual remediation/compensation;Neuromuscular education;Energy conservation;Splinting;Modalities;Manual therapy;DME and/or AE instruction    OT Goals(Current goals can be found in the care plan section) Acute Rehab OT Goals Patient Stated Goal: pt unable to state OT Goal Formulation: Patient unable to participate in goal setting Time For Goal Achievement: 02/19/22 Potential to Achieve Goals: Fair ADL Goals Pt Will Perform Grooming: bed level;with caregiver independent in assisting Pt Will Perform Upper Body Dressing: with caregiver independent in assisting;bed level Pt Will Perform Lower Body Dressing: with caregiver independent in assisting;bed level Additional ADL Goal #1: pt will perform bed mobility with mod A in prep for ADLs  Additional ADL Goal #2: Pt will follow single step command with 50% accuracy in prep for ADL participation  OT Frequency: Min 2X/week    Co-evaluation  PT/OT/SLP Co-Evaluation/Treatment: Yes Reason for Co-Treatment: Complexity of the patient's impairments (multi-system involvement);To address functional/ADL transfers;For patient/therapist safety   OT goals addressed during session: ADL's and self-care      AM-PAC OT "6 Clicks" Daily Activity     Outcome Measure Help from another person eating meals?: A Lot Help from another person taking care of personal grooming?: Total Help from another person toileting, which includes using toliet, bedpan, or urinal?: Total Help from another person bathing (including washing, rinsing, drying)?: Total Help from another person to put on and taking off regular upper body clothing?: Total Help from another person to put on and taking off regular lower body clothing?: Total 6 Click Score: 7   End of Session Nurse Communication: Mobility status  Activity Tolerance: Patient tolerated treatment well Patient left: in bed;with call bell/phone within reach;with bed alarm set;with family/visitor present  OT Visit Diagnosis: Unsteadiness on feet (R26.81);Other abnormalities of gait and mobility (R26.89);Muscle weakness (generalized) (M62.81);Low vision, both eyes (H54.2);Other symptoms and signs involving the nervous system (R29.898);Other symptoms and signs involving cognitive function;Cognitive communication deficit (R41.841);Hemiplegia and hemiparesis                Time: 5732-2025 OT Time Calculation (min): 25 min Charges:  OT General Charges $OT Visit: 1 Visit OT Evaluation $OT Eval Moderate Complexity: 1 [address] OTD, OTR/L Acute Rehab [phone] - [place] 02/05/2022, 12:57 PM

## 2022-02-05 NOTE — Assessment & Plan Note (Signed)
Continue d10 Follow SLP eval and ability to take/maintain PO

## 2022-02-05 NOTE — Assessment & Plan Note (Addendum)
Now comfort measures

## 2022-02-05 NOTE — Assessment & Plan Note (Signed)
Volume per renal  Echo pending

## 2022-02-05 NOTE — Evaluation (Signed)
Physical Therapy Evaluation Patient Details Name: Jonathan [name]. MRN: [id number] DOB: [birth year] Today's Date: 02/05/2022  History of Present Illness  [name]. is a 71 y.o. male sent from HD center for evaluation of sudden onset of drooling and altered mentations. Imaging revealed possible acute R frontal infarct. 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   Clinical Impression  Pt admitted with above. Pt currently non-verbal, perks up to name however unable to focus on person, no command follow, no voluntary movement of any extremity or initiation of mobility. Spouse aware of patients medical and functional decline and desires palliative care services here and at home. Acute PT to follow per spouses's wishes.       Recommendations for follow up therapy are one component of a multi-disciplinary discharge planning process, led by the attending physician.  Recommendations may be updated based on patient status, additional functional criteria and insurance authorization.  Follow Up Recommendations No PT follow up (pt's spouse would like home with palliative care, may benefit from HHPT to work with spouse on hoyer lift if pt with improved prognosis)      Assistance Recommended at Discharge Frequent or constant Supervision/Assistance  Patient can return home with the following  A lot of help with bathing/dressing/bathroom;Direct supervision/assist for medications management;Direct supervision/assist for financial management;Assist for transportation;Help with stairs or ramp for entrance;A lot of help with walking and/or transfers (spouse would like palliative care support)    [facility] bed (pt has hoyer lift and w/c)  Recommendations for Other Services       Functional Status Assessment Patient has had a recent decline in their functional status and/or  demonstrates limited ability to make significant improvements in function in a reasonable and predictable amount of time     Precautions / Restrictions Precautions Precautions: Fall Precaution Comments: L AKA, R BKA Restrictions Weight Bearing Restrictions: No      Mobility  Bed Mobility Overal bed mobility: Needs Assistance Bed Mobility: Supine to Sit, Sit to Supine     Supine to sit: Total assist, +2 for physical assistance, HOB elevated Sit to supine: Total assist, +2 for physical assistance   General bed mobility comments: pt with retropulsion, pushing backwards, no command follow, pt nearlypushed self off the bed due to strong retropulsion requiring PT/OT to return pt to supine. HOB raised all the way up to promote bilat hip flexion and then helicoptered to EOB, pt with less retropulsion but continues to require maxA to maintain EOB balance    Transfers                   General transfer comment: unsafe to trial this date due to retropulsion and no command follow    Ambulation/Gait               General Gait Details: unable  Stairs            Wheelchair Mobility    Modified Rankin (Stroke Patients Only) Modified Rankin (Stroke Patients Only) Pre-Morbid Rankin Score: Severe disability Modified Rankin: Severe disability     Balance Overall balance assessment: Needs assistance Sitting-balance support: Feet unsupported, Bilateral upper extremity supported Sitting balance-Leahy Scale: Zero Sitting balance - Comments: dependent on external support  Pertinent Vitals/Pain Pain Assessment Pain Assessment: Faces Faces Pain Scale: No hurt    Home Living Family/patient expects to be discharged to:: Private residence Living Arrangements: Spouse/significant other;Children Available Help at Discharge: Family;Available 24 hours/day Type of Home: House Home Access: Stairs to enter Entrance  Stairs-Rails: None Entrance Stairs-Number of Steps: 1 Alternate Level Stairs-Number of Steps: 12 Home Layout: Two level;1/2 bath on main level;Able to live on main level with bedroom/bathroom Home Equipment: Rolling Walker (2 wheels);Rollator (4 wheels);Cane - single point;Wheelchair - Radio producer Comments: dialysis MWF - [facility] transportation    Prior Function Prior Level of Function : Needs assist             Mobility Comments: wife reports she does everything for him with exception of feeding himself, pt reports despite having slide board and hoyer lift pt just transfers him to/from w/c due to his inabiligy to comprehend using slide board ADLs Comments: wife completes all ADLs, pt will attempt to feed self     Hand Dominance   Dominant Hand: Right    Extremity/Trunk Assessment   Upper Extremity Assessment Upper Extremity Assessment: Defer to OT evaluation    Lower Extremity Assessment Lower Extremity Assessment: Difficult to assess due to impaired cognition (pt with no active movement of limbs to command or voluntarily)    Cervical / Trunk Assessment Cervical / Trunk Assessment: Kyphotic  Communication   Communication: Expressive difficulties  Cognition Arousal/Alertness: Awake/alert (eyes are open however pt non-responsive, not verbal) Behavior During Therapy: Flat affect Overall Cognitive Status: History of cognitive impairments - at baseline                                 General Comments: pt with flat affect, non-verbal, only perks up to name, no command follow, blank stare, unure of pt's ability to see        General Comments General comments (skin integrity, edema, etc.): R UE swollen    Exercises     Assessment/Plan    PT Assessment Patient needs continued PT services  PT Problem List Decreased strength;Decreased activity tolerance;Decreased mobility;Decreased coordination;Decreased balance;Decreased cognition;Decreased  safety awareness       PT Treatment Interventions DME instruction;Functional mobility training;Therapeutic activities;Therapeutic exercise;Balance training;Neuromuscular re-education;Cognitive remediation;Patient/family education    PT Goals (Current goals can be found in the Care Plan section)  Acute Rehab PT Goals PT Goal Formulation: With family Time For Goal Achievement: 02/19/22 Potential to Achieve Goals: Fair    Frequency Min 2X/week     Co-evaluation               AM-PAC PT "6 Clicks" Mobility  Outcome Measure Help needed turning from your back to your side while in a flat bed without using bedrails?: Total Help needed moving from lying on your back to sitting on the side of a flat bed without using bedrails?: Total Help needed moving to and from a bed to a chair (including a wheelchair)?: Total Help needed standing up from a chair using your arms (e.g., wheelchair or bedside chair)?: Total Help needed to walk in hospital room?: Total Help needed climbing 3-5 steps with a railing? : Total 6 Click Score: 6    End of Session   Activity Tolerance: Patient tolerated treatment well Patient left: in bed;with call bell/phone within reach;with bed alarm set;with family/visitor present Nurse Communication: Mobility status PT Visit Diagnosis: Unsteadiness on feet (R26.81);Muscle weakness (generalized) (M62.81);Difficulty in walking, not  elsewhere classified (R26.2)    Time: 9233-0076 PT Time Calculation (min) (ACUTE ONLY): 25 min   Charges:   PT Evaluation $PT Eval Moderate Complexity: 1 Mod          Kittie Plater, PT, DPT Acute Rehabilitation Services Secure chat preferred Office #: [phone]   Berline Lopes 02/05/2022, 9:52 AM

## 2022-02-05 NOTE — Progress Notes (Signed)
Handoff received from [employer].  Received patient in bed on going treatment,awake, eyes openning, responding to voice , UTA orientation.    Time tx completed:2003   HD treatment completed.  Patient moderately tolerated the tx. BP dropped to 90's on the last hour of treatment but went back to 100's after tx. No signs and symptoms of complications. Patient transported back to the room, same  eyes openning, non verbal and UTA orientation. Report given to bedside RN .   Total UF removed:2L   Post HD VS: T 96.50F, BP 101/35mmHg, HR 74bpm, RR 14, SATs 100% on RA   Post HD weight: 52.6kg

## 2022-02-05 NOTE — Progress Notes (Addendum)
Received patient in bed,awake, eyes openning, responding to voice , UTA orientation. Obtunded. Informed consent signed and in chart.  Time tx completed:0235  HD treatment completed. Patient barely tolerated the tx. Fistula still slightly bleeding ( previous HD) No signs and symptoms of complications. Patient transported back to the room, same  eyes openning, non verbal and UTA orientation. Report given to bedside RN .  Total UF removed:1.9L  Post HD VS: T 97.37F, BP 136/12mmHg, HR 80bpm, RR 14, SATs 100% on RA  Post HD weight: 53.6kg

## 2022-02-05 NOTE — Progress Notes (Signed)
SLP Cancellation Note  Patient Details Name: Jonathan HARROWER Sr. MRN: [id number] DOB: 24-Dec-1950   Cancelled treatment:       Reason Eval/Treat Not Completed: Patient at procedure or test/unavailable;Other (comment) (at HD. SLP will f/u next date)   Sonia Baller, MA, CCC-SLP Speech Therapy

## 2022-02-05 DEATH — deceased

## 2022-02-06 DIAGNOSIS — I6340 Cerebral infarction due to embolism of unspecified cerebral artery: Secondary | ICD-10-CM | POA: Diagnosis not present

## 2022-02-06 DIAGNOSIS — Z515 Encounter for palliative care: Secondary | ICD-10-CM | POA: Diagnosis not present

## 2022-02-06 LAB — GLUCOSE, CAPILLARY: Glucose-Capillary: 72 mg/dL (ref 70–99)

## 2022-02-06 LAB — APTT: aPTT: 59 seconds — ABNORMAL HIGH (ref 24–36)

## 2022-02-06 LAB — HEPARIN LEVEL (UNFRACTIONATED): Heparin Unfractionated: 0.88 IU/mL — ABNORMAL HIGH (ref 0.30–0.70)

## 2022-02-06 MED ORDER — GLYCOPYRROLATE 1 MG PO TABS
1.0000 mg | ORAL_TABLET | ORAL | Status: DC | PRN
  Filled 2022-02-06: qty 1

## 2022-02-06 MED ORDER — GLYCOPYRROLATE 0.2 MG/ML IJ SOLN: 0.2000 mg | INTRAMUSCULAR | Status: DC | PRN

## 2022-02-06 MED ORDER — CHLORHEXIDINE GLUCONATE CLOTH 2 % EX PADS: 6.0000 | MEDICATED_PAD | Freq: Every day | CUTANEOUS | Status: DC

## 2022-02-06 MED ORDER — HYDROMORPHONE HCL 1 MG/ML IJ SOLN
0.5000 mg | INTRAMUSCULAR | Status: DC | PRN
  Administered 2022-02-06: 0.5 mg via INTRAVENOUS
  Filled 2022-02-06: qty 1

## 2022-02-06 MED ORDER — ONDANSETRON HCL 4 MG/2ML IJ SOLN: 4.0000 mg | Freq: Four times a day (QID) | INTRAMUSCULAR | Status: DC | PRN

## 2022-02-06 MED ORDER — ONDANSETRON 4 MG PO TBDP: 4.0000 mg | ORAL_TABLET | Freq: Four times a day (QID) | ORAL | Status: DC | PRN

## 2022-02-06 MED ORDER — METHYLPREDNISOLONE SODIUM SUCC 40 MG IJ SOLR
40.0000 mg | Freq: Two times a day (BID) | INTRAMUSCULAR | Status: DC
  Filled 2022-02-06: qty 1

## 2022-02-06 MED ORDER — BIOTENE DRY MOUTH MT LIQD: 15.0000 mL | OROMUCOSAL | Status: DC | PRN

## 2022-02-06 MED ORDER — LORAZEPAM 2 MG/ML IJ SOLN: 0.5000 mg | INTRAMUSCULAR | Status: DC | PRN

## 2022-02-06 MED ORDER — POLYVINYL ALCOHOL 1.4 % OP SOLN
1.0000 [drp] | Freq: Four times a day (QID) | OPHTHALMIC | Status: DC | PRN
  Filled 2022-02-06: qty 15

## 2022-02-06 MED ORDER — CHLORHEXIDINE GLUCONATE CLOTH 2 % EX PADS
6.0000 | MEDICATED_PAD | Freq: Every day | CUTANEOUS | Status: DC
  Administered 2022-02-06: 6 via TOPICAL

## 2022-02-06 NOTE — Progress Notes (Addendum)
Palliative Medicine Inpatient Follow Up Note  HPI: Jonathan [name]. is a 71 y.o. 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  Palliative care has been consulted in the setting of multiple strokes to further discuss goals of care.  Today's Discussion 02/06/2022  *Please note that this is a verbal dictation therefore any spelling or grammatical errors are due to the "[employer]" system interpretation.  Chart reviewed inclusive of vital signs, progress notes, laboratory results, and diagnostic images.   Discussion held this morning with myself, Dr. Jonnie Finner, and patient's spouse Jonathan [name].  Jonathan [name] expresses that she is spoken with family and determined that Jiovanny will go to an inpatient hospice to spend his final time on earth.  We reviewed the process of dying from renal failure and how he will likely become more sleepy and eventually organ systems will start to shut down.  Discussed the role of comfort oriented care. We talked about transition to comfort measures in house and what that would entail inclusive of medications to control pain, dyspnea, agitation, nausea, itching, and hiccups.    We discussed stopping all uneccessary measures such as cardiac monitoring, blood draws, needle sticks, and frequent vital signs. Created space and opportunity for Jonathan [name] to explore thoughts feelings and fears regarding current medical situation.  Identified goals for comfort.  Reviewed with Jonathan [name] the various facilities running could go to for end-of-life care.  She plans on touring at [facility] and hospice home at [facility] [place] in [place].  Discussed Baby's children and how proud of them Jonathan [name] is in terms of their accomplishments.  Emotional support has  been provided through therapeutic listening.  Questions and concerns addressed   Palliative Support Provided  Objective Assessment: Vital Signs Vitals:   02/06/22 0426 02/06/22 0844  BP: 111/81 (!) 130/46  Pulse:  80  Resp: 20 20  Temp: 98 F (36.7 C) 98 F (36.7 C)  SpO2: 100% 100%    Intake/Output Summary (Last 24 hours) at 02/06/2022 1048 Last data filed at 02/06/2022 0550 Gross per 24 hour  Intake 0 ml  Output 2000 ml  Net -2000 ml   Last Weight  Most recent update: 02/05/2022  9:06 PM    Weight  51.6 kg (113 lb 12.1 oz)            Gen: Elderly African-American male HEENT: Dry mucous membranes CV: Regular rate and rhythm PULM: On room air ABD: soft/nontender EXT: Bilateral AKA's Neuro: Somnolent  SUMMARY OF RECOMMENDATIONS   DNAR/DNI  Comfort focused care  Hemodialysis has been stopped  Comfort medications per MAR - As needed Ativan and Dilaudid for anxiety and pain  Appreciate transitions of care team following up on discharge facility  Ongoing palliative support until discharge  Limited prognosis for < 2 weeks  Time Spent: 65  Billing based on MDM: High  Problems Addressed: One acute or chronic illness or injury that poses a threat to life or bodily function  Amount and/or Complexity of Data: Category 3:Discussion of management or test interpretation with external physician/other qualified health care professional/appropriate source (not separately reported)  Risks: Decision not to resuscitate or to de-escalate care because of poor prognosis ______________________________________________________________________________________ [place] Team Team Cell Phone: [phone] Please utilize secure chat  with additional questions, if there is no response within 30 minutes please call the above phone number  Palliative Medicine Team providers are available by phone from 7am to 7pm daily and can be reached through  the team cell phone.  Should this patient require assistance outside of these hours, please call the patient's attending physician.

## 2022-02-06 NOTE — Progress Notes (Signed)
SW spoke with pt's wife [name] [phone] re hospice home placement. Pt's wife confirms agreeable to hospice home and is requesting [facility]. Referral made to [facility] with [facility] who will eval pt and notify team when bed available. SW will assist as indicated.   [name], MSW, LCSW [phone] (coverage)

## 2022-02-06 NOTE — Progress Notes (Signed)
ANTICOAGULATION CONSULT NOTE - Follow Up Consult  Pharmacy Consult for Heparin Indication:  History of LV thrombus, stroke  Allergies  Allergen Reactions   Lactose Intolerance (Gi) Other (See Comments)    Upset stomach    Patient Measurements: Height: 5\' 9"  (175.3 cm) Weight: 51.6 kg (113 lb 12.1 oz) IBW/kg (Calculated) : 70.7 Heparin Dosing Weight: 53.6 kg  Vital Signs: Temp: 98 F (36.7 C) (07/02 0426) Temp Source: Axillary (07/02 0426) BP: 111/81 (07/02 0426) Pulse Rate: 80 (07/01 2110)  Labs: Recent Labs    01/31/2022 1220 01/15/2022 1228 01/30/2022 1630 02/05/22 0824 02/05/22 1504 02/05/22 2247 02/06/22 0254  HGB 11.6* 12.6*  12.6*  --  12.5*  --  13.5  --   HCT 34.0* 37.0*  37.0*  --  36.7*  --  38.5*  --   PLT 81*  --   --  82*  --  101*  --   APTT 62*  --   --   --  38* 78* 59*  LABPROT 21.6*  --   --   --   --   --   --   INR 1.9*  --   --   --   --   --   --   HEPARINUNFRC  --   --   --   --  1.05* 1.08* 0.88*  CREATININE 8.46* 9.50*  --  5.40*  --  3.92*  --   TROPONINIHS 41*  --  43*  --   --   --   --     Estimated Creatinine Clearance: 13.2 mL/min (A) (by C-G formula based on SCr of 3.92 mg/dL (H)).   Medications:  Scheduled:   acidophilus  2 capsule Oral TID   allopurinol  100 mg Oral QHS   aspirin  81 mg Oral Daily   Or   aspirin  300 mg Rectal Daily   atorvastatin  40 mg Oral QHS   Chlorhexidine Gluconate Cloth  6 each Topical Q0600   Chlorhexidine Gluconate Cloth  6 each Topical Q0600   [START ON 02-23-2022] doxercalciferol  4 mcg Intravenous Q M,W,F-HD   Gerhardt's butt cream  1 Application Topical QID   insulin aspart  0-6 Units Subcutaneous TID WC   lidocaine-prilocaine  1 Application Topical Once per day on Mon Wed Fri   methylPREDNISolone (SOLU-MEDROL) injection  40 mg Intravenous Q12H   Infusions:   dextrose 30 mL/hr at 01/08/2022 1917   famotidine (PEPCID) IV 20 mg (02/05/22 2126)   heparin 850 Units/hr (02/05/22 1553)     Assessment: 71 yo M presenting with AMS/stroke, on apixaban PTA for history of LV thrombus. Last apixaban dose on 6/30 AM - per patient's spouse. Of note, patient was recently switched from apixaban 5 mg BID to 2.5 mg BID, ~2-3 weeks prior to this admission. Per MD - patient transitioned to heparin for now while unable to take PO medications. Pharmacy consulted for heparin dosing.  aPTT 59 - subtherapeutic, heparin level now 0.88. aPTT and heparin level not correlating, continue to titrate heparin using only aPTTs. Hgb 13.5, plt 101. No line issues or signs/symptoms of bleeding noted per RN.   Goal of Therapy:  Heparin level 0.3-0.7 units/ml aPTT 66-84 seconds Monitor platelets by anticoagulation protocol: Yes   Plan:  Increase heparin to 950 units/hr. Check ~8 hr aPTT, heparin level.  Daily CBC, heparin level, aPTT. Monitor for signs/symptoms of bleeding.   Vance Peper, PharmD PGY-2 Pharmacy Resident Phone [phone] 02/06/2022 8:13  AM   Please check AMION for all [place] phone numbers After 10:00 PM, call [place] [phone]

## 2022-02-06 NOTE — Progress Notes (Signed)
Met w/ pt's wife, plan is for transition to comfort care, no further dialysis. Will sign off.    Kelly Splinter, MD 02/06/2022, 8:31 AM

## 2022-02-06 NOTE — Progress Notes (Signed)
Pt is comfort care.   Neurology will sign off. Call with questions.

## 2022-02-06 NOTE — Progress Notes (Signed)
Jonathan [name] 2H[facility]) Hospital Liaison Note  Received request from [facility], [name],  [facility]. Chart reviewed and spoke with family to acknowledge referral. Services explained.  Eligibility confirmed. [name] is not able to offer a bed but can accept patient tomorrow.  TOC is you could please set up transport for a 9am pickup.   Please send signed and completed DNR with patient.  Bedside RN please call report to [phone].  Please call with any hospice related questions.  Thank you, Margaretmary Eddy, BSN, RN [facility] Liaison [phone]

## 2022-02-06 NOTE — Progress Notes (Signed)
PROGRESS NOTE    Jonathan [name]  OIZ:[id number] DOB: 1950-09-29 DOA: 01/22/2022 PCP: Ginger Organ., MD  Chief Complaint  Patient presents with   Altered Mental Status    Brief Narrative:  Jonathan Dollar Sr. is Jonathan [name] 71 y.o. 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    Assessment & Plan:   Principal Problem:   AMS (altered mental status) Active Problems:   Goals of care, counseling/discussion   Embolic stroke ([name])   Acute metabolic encephalopathy   Hyperkalemia   Acute on chronic combined systolic and diastolic CHF (congestive heart failure) ([name])   Hypoglycemia   Current chronic use of systemic steroids   Diabetes mellitus type 2, uncomplicated ([name])   ESRD on dialysis ([name])   LV (left ventricular) mural thrombus   S/P AKA (above knee amputation) ([name])   Amputation below knee ([name])   CHF (congestive heart failure) ([name])   Acute encephalopathy   Assessment and Plan: Goals of care, counseling/discussion Planning for comfort measures after discussion by nephrology/palliative care Working on [facility] hosp[facility]   Embolic stroke [facility]) Appreciate neurology recs - stroke thought cardio embolic due to LV thormbus MRI motion degraded, 15 mm focus of restricted diffusion within L corona radiata suspicious for acute/subacute infarct, 6 mm acute/subacute infarct within posterior right frontal lobe white matter, 4 mm acute infarct within R occipital lobe Echo EF 30-35%, grade III diastolic dysfunction, LV false tendons and apical trabeculation without definitive thrombus, RVSF severely reduced (see report) MRA head/neck deferred with comfort measures PT/OT/SLP a1c 4.1, LDL pending  Acute metabolic encephalopathy Related to acute stroke, missed dialysis  sessions At baseline, per 08/2021 note, 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  Delirium precautions  Hyperkalemia Improved after dialysis  Acute on chronic combined systolic and diastolic CHF (congestive heart failure) ([name]) Volume per renal  Echo as above  Current chronic use of systemic steroids Now comfort measures   Hypoglycemia Comfort measures  Diabetes mellitus type 2, uncomplicated ([name]) N0N 4.1, requiring d10 at this time - last several A1c's not in diabetic or prediabetic range  ESRD on dialysis ([place]) Per renal  LV (left ventricular) mural thrombus Comfort measures  Amputation below knee ([name]) On L   S/P AKA (above knee amputation) ([name]) On R     DVT prophylaxis: comfort measures Code Status: DNR Family Communication: wife Disposition:   Status is: Inpatient Remains inpatient appropriate because: comfort measures   Consultants:  Renal Palliative neurology  Procedures:  Echo IMPRESSIONS     1. Left ventricular ejection fraction, by estimation, is 30 to 35%. The  left ventricle has moderately decreased function. The left ventricle  demonstrates regional wall motion abnormalities (see scoring  diagram/findings for description). There is mild  left ventricular hypertrophy. Left ventricular diastolic parameters are  consistent with Grade III diastolic dysfunction (restrictive).   2. LV false tendons and apical trabeculation without definitive thrombus.  Suggest limited Definity contrast study to clearly exclude apical LV mural  thrombus.   3. Right ventricular systolic function is severely reduced. The right  ventricular size is normal. There is severely elevated pulmonary artery  systolic pressure. The estimated right ventricular systolic pressure is  39.7 mmHg.   4. Left atrial size was severely dilated.   5.  The mitral valve is abnormal. Severe mitral valve regurgitation.  Moderate mitral annular calcification.    6. Tricuspid valve regurgitation is mild to moderate.   7. The aortic valve is tricuspid. There is moderate calcification of the  aortic valve. Aortic valve regurgitation is trivial. Aortic valve  sclerosis/calcification is present, without any evidence of aortic  stenosis.   8. The inferior vena cava is normal in size with <50% respiratory  variability, suggesting right atrial pressure of 8 mmHg.   9. Agitated saline contrast bubble study was negative, with no evidence  of any interatrial shunt.   Comparison(s): Prior images reviewed side by side. No significant change  in LVEF. Mitral regurgitation is severe.   Antimicrobials:  Anti-infectives (From admission, onward)    None       Subjective: Sleeping Wife at bedside  Objective: Vitals:   02/05/22 2110 02/05/22 2356 02/06/22 0426 02/06/22 0844  BP: (!) 131/92 (!) 124/96 111/81 (!) 130/46  Pulse: 80   80  Resp: 15 20 20 20   Temp: 97.9 F (36.6 C) 98.6 F (37 C) 98 F (36.7 C) 98 F (36.7 C)  TempSrc: Axillary Axillary Axillary Oral  SpO2: 99% 100% 100% 100%  Weight:      Height:        Intake/Output Summary (Last 24 hours) at 02/06/2022 1508 Last data filed at 02/06/2022 0550 Gross per 24 hour  Intake 0 ml  Output 2000 ml  Net -2000 ml   Filed Weights   02/05/22 0310 02/05/22 1300 02/05/22 2023  Weight: 53.6 kg 53.6 kg 51.6 kg    Examination:  General exam: Appears calm and comfortable  Respiratory system: unlabored Central nervous system: sleeping  Data Reviewed: I have personally reviewed following labs and imaging studies  CBC: Recent Labs  Lab 01/23/2022 1220 01/25/2022 1228 02/05/22 0824 02/05/22 2247  WBC 4.8  --  5.3 7.2  NEUTROABS 4.1  --   --   --   HGB 11.6* 12.6*  12.6* 12.5* 13.5  HCT 34.0* 37.0*  37.0* 36.7* 38.5*  MCV 104.3*  --  100.3* 100.8*  PLT 81*  --  82* 101*    Basic Metabolic Panel: Recent Labs  Lab 01/18/2022 1220 01/14/2022 1228 01/19/2022 2231 02/05/22 0824  02/05/22 2247  NA 137 132*  132*  --  136 135  K 6.4* 6.1*  6.1*  --  4.8 4.3  CL 94* 96*  --  93* 94*  CO2 23  --   --  24 26  GLUCOSE 191* 180* 107* 134* 90  BUN 143* >130*  --  74* 48*  CREATININE 8.46* 9.50*  --  5.40* 3.92*  CALCIUM 8.9  --   --  9.2 8.9  PHOS  --   --   --  6.5* 5.5*    GFR: Estimated Creatinine Clearance: 13.2 mL/min (Mirely Pangle) (by C-G formula based on SCr of 3.92 mg/dL (H)).  Liver Function Tests: Recent Labs  Lab 01/09/2022 1220 02/05/22 0824 02/05/22 2247  AST 57* 36  --   ALT 70* 66*  --   ALKPHOS 172* 184*  --   BILITOT 1.1 1.3*  --   PROT 5.1* 5.6*  --   ALBUMIN 2.8* 3.0* 3.1*    CBG: Recent Labs  Lab 01/06/2022 2029 02/05/22 0639 02/05/22 1246 02/05/22 2136 02/06/22 0557  GLUCAP 107* 111* 141* 99 72     Recent Results (from the past 240 hour(s))  Resp Panel by RT-PCR (Flu Kirbi Farrugia&B, Covid) Anterior  Nasal Swab     Status: None   Collection Time: 02/03/2022 12:20 PM   Specimen: Anterior Nasal Swab  Result Value Ref Range Status   SARS Coronavirus 2 by RT PCR NEGATIVE NEGATIVE Final    Comment: (NOTE) SARS-CoV-2 target nucleic acids are NOT DETECTED.  The SARS-CoV-2 RNA is generally detectable in upper respiratory specimens during the acute phase of infection. The lowest concentration of SARS-CoV-2 viral copies this assay can detect is 138 copies/mL. Jalena Vanderlinden negative result does not preclude SARS-Cov-2 infection and should not be used as the sole basis for treatment or other patient management decisions. Tahira Olivarez negative result may occur with  improper specimen collection/handling, submission of specimen other than nasopharyngeal swab, presence of viral mutation(s) within the areas targeted by this assay, and inadequate number of viral copies(<138 copies/mL). Jakira Mcfadden negative result must be combined with clinical observations, patient history, and epidemiological information. The expected result is Negative.  Fact Sheet for Patients:   EntrepreneurPulse.com.au  Fact Sheet for Healthcare Providers:  IncredibleEmployment.be  This test is no t yet approved or cleared by the [place] FDA and  has been authorized for detection and/or diagnosis of SARS-CoV-2 by FDA under an Emergency Use Authorization (EUA). This EUA will remain  in effect (meaning this test can be used) for the duration of the COVID-19 declaration under Section 564(b)(1) of the Act, 21 U.S.C.section 360bbb-3(b)(1), unless the authorization is terminated  or revoked sooner.       Influenza Zitlali Primm by PCR NEGATIVE NEGATIVE Final   Influenza B by PCR NEGATIVE NEGATIVE Final    Comment: (NOTE) The Xpert Xpress SARS-CoV-2/FLU/RSV plus assay is intended as an aid in the diagnosis of influenza from Nasopharyngeal swab specimens and should not be used as Dani Danis sole basis for treatment. Nasal washings and aspirates are unacceptable for Xpert Xpress SARS-CoV-2/FLU/RSV testing.  Fact Sheet for Patients: EntrepreneurPulse.com.au  Fact Sheet for Healthcare Providers: IncredibleEmployment.be  This test is not yet approved or cleared by the [place] FDA and has been authorized for detection and/or diagnosis of SARS-CoV-2 by FDA under an Emergency Use Authorization (EUA). This EUA will remain in effect (meaning this test can be used) for the duration of the COVID-19 declaration under Section 564(b)(1) of the Act, 21 U.S.C. section 360bbb-3(b)(1), unless the authorization is terminated or revoked.  Performed at [facility], [place] [address] [place], [place] [address]          Radiology Studies: ECHOCARDIOGRAM COMPLETE BUBBLE STUDY  Result Date: 02/05/2022    ECHOCARDIOGRAM REPORT   Patient Name:   Jonathan MADANI Sr. Date of Exam: 02/05/2022 Medical Rec #:  [id number]          Height:       69.0 in Accession #:    [id number]         Weight:       118.2 lb Date of Birth:  1951-05-22           BSA:          1.652 m Patient Age:    61 years           BP:           114/69 mmHg Patient Gender: M                  HR:           73 bpm. Exam Location:  Inpatient Procedure: 2D Echo and Saline Contrast Bubble Study Indications:  stroke  History:        Patient has prior history of Echocardiogram examinations, most                 recent 06/04/2021. History of LV thrombus and CHF, CAD, end                 stage renal disease, Signs/Symptoms:Altered Mental Status; Risk                 Factors:Hypertension and Diabetes.  Sonographer:    Johny Chess RDCS Referring Phys: [id number] Lequita Halt  Sonographer Comments: Image acquisition challenging due to uncooperative patient. IMPRESSIONS  1. Left ventricular ejection fraction, by estimation, is 30 to 35%. The left ventricle has moderately decreased function. The left ventricle demonstrates regional wall motion abnormalities (see scoring diagram/findings for description). There is mild left ventricular hypertrophy. Left ventricular diastolic parameters are consistent with Grade III diastolic dysfunction (restrictive).  2. LV false tendons and apical trabeculation without definitive thrombus. Suggest limited Definity contrast study to clearly exclude apical LV mural thrombus.  3. Right ventricular systolic function is severely reduced. The right ventricular size is normal. There is severely elevated pulmonary artery systolic pressure. The estimated right ventricular systolic pressure is 19.3 mmHg.  4. Left atrial size was severely dilated.  5. The mitral valve is abnormal. Severe mitral valve regurgitation. Moderate mitral annular calcification.  6. Tricuspid valve regurgitation is mild to moderate.  7. The aortic valve is tricuspid. There is moderate calcification of the aortic valve. Aortic valve regurgitation is trivial. Aortic valve sclerosis/calcification is present, without any evidence of aortic stenosis.  8. The inferior vena cava is normal in size  with <50% respiratory variability, suggesting right atrial pressure of 8 mmHg.  9. Agitated saline contrast bubble study was negative, with no evidence of any interatrial shunt. Comparison(s): Prior images reviewed side by side. No significant change in LVEF. Mitral regurgitation is severe. FINDINGS  Left Ventricle: Left ventricular ejection fraction, by estimation, is 30 to 35%. The left ventricle has moderately decreased function. The left ventricle demonstrates regional wall motion abnormalities. The left ventricular internal cavity size was normal in size. There is mild left ventricular hypertrophy. Left ventricular diastolic parameters are consistent with Grade III diastolic dysfunction (restrictive).  LV Wall Scoring: The mid and distal anterior septum, mid inferoseptal segment, apical anterior segment, and apex are akinetic. The anterior wall, basal anteroseptal segment, apical lateral segment, apical inferior segment, and basal inferoseptal segment are hypokinetic. The antero-lateral wall, inferior wall, and posterior wall are normal. Right Ventricle: The right ventricular size is normal. No increase in right ventricular wall thickness. Right ventricular systolic function is severely reduced. There is severely elevated pulmonary artery systolic pressure. The tricuspid regurgitant velocity is 3.76 m/s, and with an assumed right atrial pressure of 8 mmHg, the estimated right ventricular systolic pressure is 79.0 mmHg. Left Atrium: Left atrial size was severely dilated. Right Atrium: Right atrial size was normal in size. Pericardium: There is no evidence of pericardial effusion. Mitral Valve: The mitral valve is abnormal. There is mild thickening of the mitral valve leaflet(s). Moderate mitral annular calcification. Severe mitral valve regurgitation. Tricuspid Valve: The tricuspid valve is grossly normal. Tricuspid valve regurgitation is mild to moderate. Aortic Valve: The aortic valve is tricuspid. There is  moderate calcification of the aortic valve. There is mild aortic valve annular calcification. Aortic valve regurgitation is trivial. Aortic valve sclerosis/calcification is present, without any evidence of aortic stenosis. Pulmonic Valve: The  pulmonic valve was grossly normal. Pulmonic valve regurgitation is trivial. Aorta: The aortic root is normal in size and structure. Venous: The inferior vena cava is normal in size with less than 50% respiratory variability, suggesting right atrial pressure of 8 mmHg. IAS/Shunts: No atrial level shunt detected by color flow Doppler. Agitated saline contrast was given intravenously to evaluate for intracardiac shunting. Agitated saline contrast bubble study was negative, with no evidence of any interatrial shunt. Additional Comments: There is pleural effusion in the left lateral region.  LEFT VENTRICLE PLAX 2D LVIDd:         5.40 cm      Diastology LVIDs:         4.50 cm      LV e' medial:    4.57 cm/s LV PW:         0.90 cm      LV E/e' medial:  29.3 LV IVS:        1.10 cm      LV e' lateral:   10.00 cm/s LVOT diam:     1.90 cm      LV E/e' lateral: 13.4 LV SV:         32 LV SV Index:   19 LVOT Area:     2.84 cm  LV Volumes (MOD) LV vol d, MOD A4C: 194.0 ml LV vol s, MOD A4C: 149.0 ml LV SV MOD A4C:     194.0 ml RIGHT VENTRICLE            IVC RV S prime:     6.74 cm/s  IVC diam: 2.00 cm TAPSE (M-mode): 1.1 cm LEFT ATRIUM              Index        RIGHT ATRIUM           Index LA diam:        4.90 cm  2.97 cm/m   RA Area:     17.50 cm LA Vol (A2C):   104.0 ml 62.95 ml/m  RA Volume:   50.80 ml  30.75 ml/m LA Vol (A4C):   122.0 ml 73.85 ml/m LA Biplane Vol: 118.0 ml 71.43 ml/m  AORTIC VALVE LVOT Vmax:   68.30 cm/s LVOT Vmean:  41.900 cm/s LVOT VTI:    0.112 m  AORTA Ao Root diam: 3.00 cm Ao Asc diam:  2.70 cm MITRAL VALVE                  TRICUSPID VALVE MV Area (PHT): 5.27 cm       TR Peak grad:   56.6 mmHg MV Decel Time: 144 msec       TR Vmax:        376.00 cm/s MR Peak  grad:    78.1 mmHg MR Mean grad:    53.0 mmHg    SHUNTS MR Vmax:         442.00 cm/s  Systemic VTI:  0.11 m MR Vmean:        345.0 cm/s   Systemic Diam: 1.90 cm MR PISA:         3.08 cm MR PISA Eff ROA: 27 mm MR PISA Radius:  0.70 cm MV E velocity: 134.00 cm/s MV Jamien Casanova velocity: 51.40 cm/s MV E/Jarika Robben ratio:  2.61 Rozann Lesches MD Electronically signed by Rozann Lesches MD Signature Date/Time: 02/05/2022/3:11:35 PM    Final         Scheduled Meds: Continuous Infusions:   LOS: 2 days  Time spent: over 30 min    Fayrene Helper, MD [facility]   To contact the attending provider between 7A-7P or the covering provider during after hours 7P-7A, please log into the web site [email or web address] and access using universal [place] password for that web site. If you do not have the password, please call the hospital operator.  02/06/2022, 3:08 PM

## 2022-02-15 ENCOUNTER — Encounter: Payer: Medicare Other | Admitting: Speech Pathology

## 2022-03-08 NOTE — Progress Notes (Signed)
HOSPITAL MEDICINE OVERNIGHT EVENT NOTE    Notified by nursing that patient has unfortunately expired.  Patient was hospitalized on 6/30 with sudden acute neurologic changes.  Patient has ultimately been found to be suffering from multifocal strokes thought to be cardioembolic due to a left ventricular thrombus.  Patient unfortunately had a very poor prognosis and after long discussions with the family patient was made comfort measures on 7/2.  Patient expired this morning, 2 RNs pronounced with time of death being 4:54AM.  Family is at the bedside and therefore is aware.  Mortality services will be notified.  Jonathan Emerald  MD [facility]

## 2022-03-08 NOTE — Progress Notes (Signed)
   Palliative Medicine Inpatient Follow Up Note   Chart review complete.   Jonathan [name] passed away early this morning.  I spoke to Jonathan [name]'s wife, Jonathan [name] and offered my condolences.  No charge ______________________________________________________________________________________ [place] Team Team Cell Phone: [phone] Please utilize secure chat with additional questions, if there is no response within 30 minutes please call the above phone number  Palliative Medicine Team providers are available by phone from 7am to 7pm daily and can be reached through the team cell phone.  Should this patient require assistance outside of these hours, please call the patient's attending physician.

## 2022-03-08 NOTE — Progress Notes (Signed)
Pt. Pronounced dead by Cleda Clarks, RN and Gardner Candle, RN at 04:54.

## 2022-03-08 NOTE — Death Summary Note (Signed)
DEATH SUMMARY   Patient Details  Name: Jonathan DUQUE Sr. MRN: [id number] DOB: 1951-07-27 WER:XVQM, Emily Filbert., MD Admission/Discharge Information   Admit Date:  March 04, 2022  Date of Death: Date of Death: 03-07-2022  Time of Death: Time of Death: 0454  Length of Stay: 3   Principle Cause of death: embolic stroke  Hospital Diagnoses: Principal Problem:   Embolic stroke [facility]) Active Problems:   Goals of care, counseling/discussion   Acute metabolic encephalopathy   Hyperkalemia   Acute on chronic combined systolic and diastolic CHF (congestive heart failure) ([name])   Hypoglycemia   Current chronic use of systemic steroids   Diabetes mellitus type 2, uncomplicated ([name])   ESRD on dialysis ([name])   LV (left ventricular) mural thrombus   S/P AKA (above knee amputation) ([name])   Amputation below knee ([name])   CHF (congestive heart failure) ([place])   Acute encephalopathy   AMS (altered mental status)   Hospital Course: Jonathan LITKE Sr. is Jonathan [name] 71 y.o. 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  AMS was thought related to uremia with missed dialysis as well as his acute strokes.  MRI was motion degraded, but showed multiple bilateral ischemic infarcts.  Palliative care was consulted and he was transitioned to comfort measures.  He passed away on the morning of 2023/03/08.   See below for additional details  Assessment and Plan: * Embolic stroke [facility]) Appreciate neurology recs - stroke thought cardio embolic due to LV thormbus MRI motion degraded, 15 mm focus of restricted diffusion within L corona radiata suspicious for acute/subacute infarct, 6 mm acute/subacute infarct within posterior right frontal lobe white matter, 4 mm acute infarct within R occipital lobe Echo EF  30-35%, grade III diastolic dysfunction, LV false tendons and apical trabeculation without definitive thrombus, RVSF severely reduced (see report) MRA head/neck deferred with comfort measures PT/OT/SLP a1c 4.1, LDL pending  Goals of care, counseling/discussion Planning for comfort measures after discussion by nephrology/palliative care Passed away March 08, 2023 AM  Acute metabolic encephalopathy Related to acute stroke, missed dialysis sessions At baseline, per 08/2021 note, 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  Delirium precautions  Hyperkalemia Improved after dialysis  Acute on chronic combined systolic and diastolic CHF (congestive heart failure) ([name]) Volume per renal  Echo as above  Current chronic use of systemic steroids Now comfort measures   Hypoglycemia Comfort measures  Diabetes mellitus type 2, uncomplicated ([name]) Y1P 4.1, requiring d10 at this time - last several A1c's not in diabetic or prediabetic range  ESRD on dialysis ([place]) Per renal  LV (left ventricular) mural thrombus Comfort measures  Amputation below knee ([name]) On L   S/P AKA (above knee amputation) ([place]) On R   Spoke with wife this AM to offer condolences.  No questions from her standpoint at this time.      Procedures: none  Consultations: renal, palliative  The results of significant diagnostics from this hospitalization (including imaging, microbiology, ancillary and laboratory) are listed below for reference.   Significant Diagnostic Studies: ECHOCARDIOGRAM COMPLETE BUBBLE STUDY  Result Date: 02/05/2022    ECHOCARDIOGRAM REPORT   Patient Name:   Jonathan CLEARY Sr. Date of Exam: 02/05/2022 Medical Rec #:  [id number]          Height:  69.0 in Accession #:    [id number]         Weight:       118.2 lb Date of Birth:  [birth year]          BSA:          1.652 m Patient Age:    37 years           BP:           114/69 mmHg Patient Gender: M                  HR:            73 bpm. Exam Location:  Inpatient Procedure: 2D Echo and Saline Contrast Bubble Study Indications:    stroke  History:        Patient has prior history of Echocardiogram examinations, most                 recent 06/04/2021. History of LV thrombus and CHF, CAD, end                 stage renal disease, Signs/Symptoms:Altered Mental Status; Risk                 Factors:Hypertension and Diabetes.  Sonographer:    Johny Chess RDCS Referring Phys: [id number] Lequita Halt  Sonographer Comments: Image acquisition challenging due to uncooperative patient. IMPRESSIONS  1. Left ventricular ejection fraction, by estimation, is 30 to 35%. The left ventricle has moderately decreased function. The left ventricle demonstrates regional wall motion abnormalities (see scoring diagram/findings for description). There is mild left ventricular hypertrophy. Left ventricular diastolic parameters are consistent with Grade III diastolic dysfunction (restrictive).  2. LV false tendons and apical trabeculation without definitive thrombus. Suggest limited Definity contrast study to clearly exclude apical LV mural thrombus.  3. Right ventricular systolic function is severely reduced. The right ventricular size is normal. There is severely elevated pulmonary artery systolic pressure. The estimated right ventricular systolic pressure is 43.1 mmHg.  4. Left atrial size was severely dilated.  5. The mitral valve is abnormal. Severe mitral valve regurgitation. Moderate mitral annular calcification.  6. Tricuspid valve regurgitation is mild to moderate.  7. The aortic valve is tricuspid. There is moderate calcification of the aortic valve. Aortic valve regurgitation is trivial. Aortic valve sclerosis/calcification is present, without any evidence of aortic stenosis.  8. The inferior vena cava is normal in size with <50% respiratory variability, suggesting right atrial pressure of 8 mmHg.  9. Agitated saline contrast bubble study was negative, with  no evidence of any interatrial shunt. Comparison(s): Prior images reviewed side by side. No significant change in LVEF. Mitral regurgitation is severe. FINDINGS  Left Ventricle: Left ventricular ejection fraction, by estimation, is 30 to 35%. The left ventricle has moderately decreased function. The left ventricle demonstrates regional wall motion abnormalities. The left ventricular internal cavity size was normal in size. There is mild left ventricular hypertrophy. Left ventricular diastolic parameters are consistent with Grade III diastolic dysfunction (restrictive).  LV Wall Scoring: The mid and distal anterior septum, mid inferoseptal segment, apical anterior segment, and apex are akinetic. The anterior wall, basal anteroseptal segment, apical lateral segment, apical inferior segment, and basal inferoseptal segment are hypokinetic. The antero-lateral wall, inferior wall, and posterior wall are normal. Right Ventricle: The right ventricular size is normal. No increase in right ventricular wall thickness. Right ventricular systolic function is severely reduced. There is severely elevated pulmonary artery systolic pressure. The  tricuspid regurgitant velocity is 3.76 m/s, and with an assumed right atrial pressure of 8 mmHg, the estimated right ventricular systolic pressure is 16.1 mmHg. Left Atrium: Left atrial size was severely dilated. Right Atrium: Right atrial size was normal in size. Pericardium: There is no evidence of pericardial effusion. Mitral Valve: The mitral valve is abnormal. There is mild thickening of the mitral valve leaflet(s). Moderate mitral annular calcification. Severe mitral valve regurgitation. Tricuspid Valve: The tricuspid valve is grossly normal. Tricuspid valve regurgitation is mild to moderate. Aortic Valve: The aortic valve is tricuspid. There is moderate calcification of the aortic valve. There is mild aortic valve annular calcification. Aortic valve regurgitation is trivial. Aortic  valve sclerosis/calcification is present, without any evidence of aortic stenosis. Pulmonic Valve: The pulmonic valve was grossly normal. Pulmonic valve regurgitation is trivial. Aorta: The aortic root is normal in size and structure. Venous: The inferior vena cava is normal in size with less than 50% respiratory variability, suggesting right atrial pressure of 8 mmHg. IAS/Shunts: No atrial level shunt detected by color flow Doppler. Agitated saline contrast was given intravenously to evaluate for intracardiac shunting. Agitated saline contrast bubble study was negative, with no evidence of any interatrial shunt. Additional Comments: There is pleural effusion in the left lateral region.  LEFT VENTRICLE PLAX 2D LVIDd:         5.40 cm      Diastology LVIDs:         4.50 cm      LV e' medial:    4.57 cm/s LV PW:         0.90 cm      LV E/e' medial:  29.3 LV IVS:        1.10 cm      LV e' lateral:   10.00 cm/s LVOT diam:     1.90 cm      LV E/e' lateral: 13.4 LV SV:         32 LV SV Index:   19 LVOT Area:     2.84 cm  LV Volumes (MOD) LV vol d, MOD A4C: 194.0 ml LV vol s, MOD A4C: 149.0 ml LV SV MOD A4C:     194.0 ml RIGHT VENTRICLE            IVC RV S prime:     6.74 cm/s  IVC diam: 2.00 cm TAPSE (M-mode): 1.1 cm LEFT ATRIUM              Index        RIGHT ATRIUM           Index LA diam:        4.90 cm  2.97 cm/m   RA Area:     17.50 cm LA Vol (A2C):   104.0 ml 62.95 ml/m  RA Volume:   50.80 ml  30.75 ml/m LA Vol (A4C):   122.0 ml 73.85 ml/m LA Biplane Vol: 118.0 ml 71.43 ml/m  AORTIC VALVE LVOT Vmax:   68.30 cm/s LVOT Vmean:  41.900 cm/s LVOT VTI:    0.112 m  AORTA Ao Root diam: 3.00 cm Ao Asc diam:  2.70 cm MITRAL VALVE                  TRICUSPID VALVE MV Area (PHT): 5.27 cm       TR Peak grad:   56.6 mmHg MV Decel Time: 144 msec       TR Vmax:  376.00 cm/s MR Peak grad:    78.1 mmHg MR Mean grad:    53.0 mmHg    SHUNTS MR Vmax:         442.00 cm/s  Systemic VTI:  0.11 m MR Vmean:        345.0 cm/s    Systemic Diam: 1.90 cm MR PISA:         3.08 cm MR PISA Eff ROA: 27 mm MR PISA Radius:  0.70 cm MV E velocity: 134.00 cm/s MV Darrion Wyszynski velocity: 51.40 cm/s MV E/Araina Butrick ratio:  2.61 Rozann Lesches MD Electronically signed by Rozann Lesches MD Signature Date/Time: 02/05/2022/3:11:35 PM    Final    MR BRAIN WO CONTRAST  Result Date: 01/07/2022 CLINICAL DATA:  Provided history: Stroke, follow-up. EXAM: MRI HEAD WITHOUT CONTRAST TECHNIQUE: Multiplanar, multiecho pulse sequences of the brain and surrounding structures were obtained without intravenous contrast. COMPARISON:  Prior head CT examinations 01/12/2022 and earlier. Brain MRI 07/31/2019. FINDINGS: Brain: Moderate to moderately advanced generalized cerebral atrophy. Comparatively mild cerebellar atrophy. 6 mm focus of restricted diffusion within the posterior right frontal lobe white matter compatible with an acute/subacute (series 2, image 32) 4 mm acute infarct within the right occipital lobe (series 2, image 19). 15 mm focus of apparent subtle restricted diffusion within the left corona radiata suspicious for an acute/subacute infarct (series 2, image 28) (series 250, image 28). Moderate-sized chronic cortical/subcortical left infarct within the left frontal lobe/anterior left insula. Chronic lacunar infarcts within the bilateral cerebral hemispheric white matter, bilateral deep gray nuclei, corpus callosum and within the pons. Background advanced patchy and confluent T2 FLAIR hyperintensity are abnormality within the cerebral white matter and pons, nonspecific but compatible with chronic small vessel ischemic disease. No evidence of an intracranial mass. No chronic intracranial blood products. No extra-axial fluid collection. No midline shift. Vascular: Maintained flow voids within the proximal large arterial vessels. Skull and upper cervical spine: No focal suspicious marrow lesion. Nonspecific reversal of the expected cervical lordosis. Mild C3-C4 and C4-C5 grade 1  retrolisthesis. Incompletely assessed cervical spondylosis. Sinuses/Orbits: No acute orbital finding. Prior bilateral ocular lens replacement. 8 mm cystic focus lateral to the left globe, possibly within the lacrimal gland, unchanged from the prior brain MRI of 07/31/2019. Mild mucosal thickening within the right frontal and anterior right ethmoid sinuses. 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 Trace fluid within the bilateral mastoid air cells. IMPRESSION: 1. Mildly motion degraded exam. 2. 15 mm focus of apparent subtle restricted diffusion within the left corona radiata suspicious for an acute/subacute infarct. 3. 6 mm acute/subacute infarct within the posterior right frontal lobe white matter. 4. 4 mm acute infarct within the right occipital lobe. 5. Background parenchymal atrophy, advanced chronic small vessel ischemic disease and chronic infarcts as detailed and similar to the prior brain MRI of 07/31/2019. Electronically Signed   By: Kellie Simmering D.O.   On: 01/15/2022 14:58   DG Chest Portable 1 View  Result Date: 02/02/2022 CLINICAL DATA:  Cough, difficulty breathing EXAM: PORTABLE CHEST 1 VIEW COMPARISON:  Previous studies including the examination of 09/01/2021 FINDINGS: Transverse diameter of heart is increased. Central pulmonary vessels are prominent. Increased interstitial markings are seen in the parahilar regions and lower lung fields. Small bilateral pleural effusions are seen, more so on the right side. There is no pneumothorax. Vascular stent is noted in the course of right axillary and subclavian vessels. IMPRESSION: Cardiomegaly. Central pulmonary  vessels are prominent suggesting CHF. Increased interstitial markings in both lungs suggest pulmonary edema. Small bilateral pleural effusions, more so on the right side. Electronically Signed   By: Elmer Picker M.D.   On: 01/26/2022 12:48   CT HEAD  CODE STROKE WO CONTRAST  Result Date: 02/03/2022 CLINICAL DATA:  Code stroke.  Neuro deficit, acute, stroke suspected EXAM: CT HEAD WITHOUT CONTRAST TECHNIQUE: Contiguous axial images were obtained from the base of the skull through the vertex without intravenous contrast. RADIATION DOSE REDUCTION: This exam was performed according to the departmental dose-optimization program which includes automated exposure control, adjustment of the mA and/or kV according to patient size and/or use of iterative reconstruction technique. COMPARISON:  12/03/2021 FINDINGS: Brain: No acute intracranial hemorrhage, mass effect, or edema. Chronic left frontal infarct. Wallerian degeneration along the left cerebral peduncle. Additional patchy and confluent hypoattenuation in the supratentorial white matter is nonspecific but probably reflects stable chronic microvascular ischemic changes. Probable superimposed chronic small vessel infarcts. Prominence of the ventricles and sulci reflects stable parenchymal volume loss. No extra-axial collection. Vascular: No hyperdense vessel. Atherosclerotic calcification at the skull base. Extensive calcified external carotid artery branches. Skull: Unremarkable. Sinuses/Orbits: No acute finding. Other: Mastoid air cells are clear.  Right occipital scalp lipoma ASPECTS ([place] Stroke Program Early CT Score) - Ganglionic level infarction (caudate, lentiform nuclei, internal capsule, insula, M1-M3 cortex): 7 - Supraganglionic infarction (M4-M6 cortex): 3 Total score (0-10 with 10 being normal): 10 IMPRESSION: There is no acute intracranial hemorrhage or evidence of acute infarction. ASPECT score is 10. No significant change since April 2023 examination. These results were communicated to Dr. Cheral Marker at 11:41 am on 01/22/2022 by text page via the [facility] messaging system. Electronically Signed   By: Macy Mis M.D.   On: 01/26/2022 11:43    Microbiology: Recent Results (from the past 240 hour(s))   Resp Panel by RT-PCR (Flu Georgianne Gritz&B, Covid) Anterior Nasal Swab     Status: None   Collection Time: 01/20/2022 12:20 PM   Specimen: Anterior Nasal Swab  Result Value Ref Range Status   SARS Coronavirus 2 by RT PCR NEGATIVE NEGATIVE Final    Comment: (NOTE) SARS-CoV-2 target nucleic acids are NOT DETECTED.  The SARS-CoV-2 RNA is generally detectable in upper respiratory specimens during the acute phase of infection. The lowest concentration of SARS-CoV-2 viral copies this assay can detect is 138 copies/mL. Deven Audi negative result does not preclude SARS-Cov-2 infection and should not be used as the sole basis for treatment or other patient management decisions. Tatijana Bierly negative result may occur with  improper specimen collection/handling, submission of specimen other than nasopharyngeal swab, presence of viral mutation(s) within the areas targeted by this assay, and inadequate number of viral copies(<138 copies/mL). Pattrick Bady negative result must be combined with clinical observations, patient history, and epidemiological information. The expected result is Negative.  Fact Sheet for Patients:  EntrepreneurPulse.com.au  Fact Sheet for Healthcare Providers:  IncredibleEmployment.be  This test is no t yet approved or cleared by the [place] FDA and  has been authorized for detection and/or diagnosis of SARS-CoV-2 by FDA under an Emergency Use Authorization (EUA). This EUA will remain  in effect (meaning this test can be used) for the duration of the COVID-19 declaration under Section 564(b)(1) of the Act, 21 U.S.C.section 360bbb-3(b)(1), unless the authorization is terminated  or revoked sooner.       Influenza Shardea Cwynar by PCR NEGATIVE NEGATIVE Final   Influenza B by PCR NEGATIVE NEGATIVE Final    Comment: (  NOTE) The Xpert Xpress SARS-CoV-2/FLU/RSV plus assay is intended as an aid in the diagnosis of influenza from Nasopharyngeal swab specimens and should not be used as Nayda Riesen  sole basis for treatment. Nasal washings and aspirates are unacceptable for Xpert Xpress SARS-CoV-2/FLU/RSV testing.  Fact Sheet for Patients: EntrepreneurPulse.com.au  Fact Sheet for Healthcare Providers: IncredibleEmployment.be  This test is not yet approved or cleared by the [place] FDA and has been authorized for detection and/or diagnosis of SARS-CoV-2 by FDA under an Emergency Use Authorization (EUA). This EUA will remain in effect (meaning this test can be used) for the duration of the COVID-19 declaration under Section 564(b)(1) of the Act, 21 U.S.C. section 360bbb-3(b)(1), unless the authorization is terminated or revoked.  Performed at [facility], [place] [address] [place], [place] [address]     Time spent: 20 minutes  Signed: Fayrene Helper, MD 2022/02/16

## 2022-03-08 DEATH — deceased

## 2023-04-26 IMAGING — CT CT HEAD W/O CM
2 of 11 series · 8 of 47 positions shown, 10 images · non-contrast
Comparison: 07/31/2019

CLINICAL DATA: Delirium, altered mental status



[Series 3: head 2.0 mpr ax · axial · 0.44mm/px · z∈[-626,-492]mm · 5 of 85 slices shown, 7 images]
[im 9/85  brain]
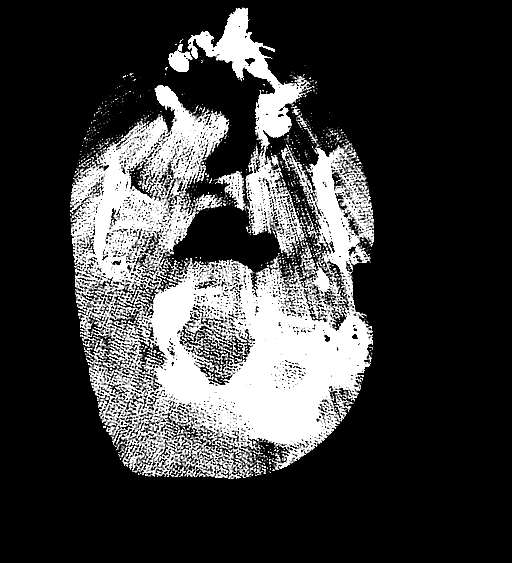
[im 9/85  bone]
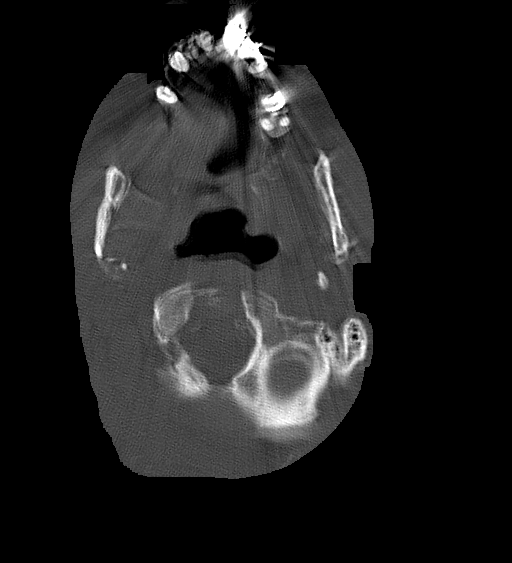
[im 26/85  brain]
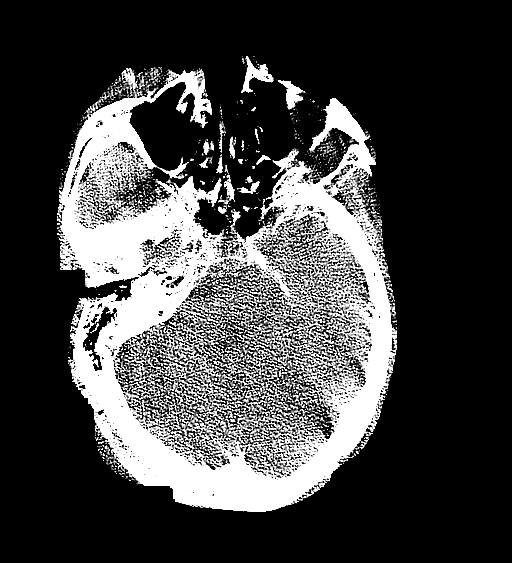
[im 43/85  brain]
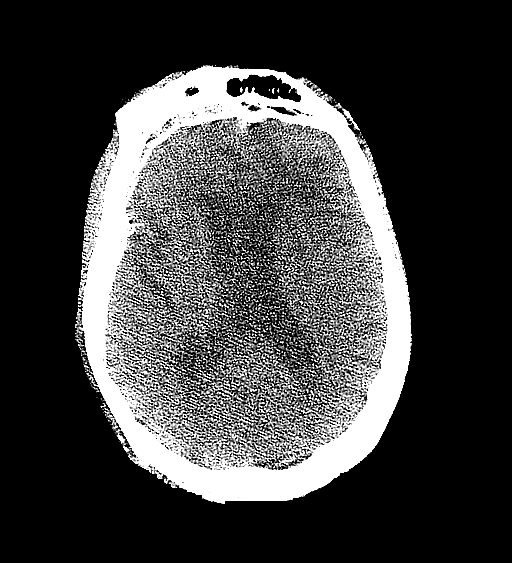
[im 59/85  brain]
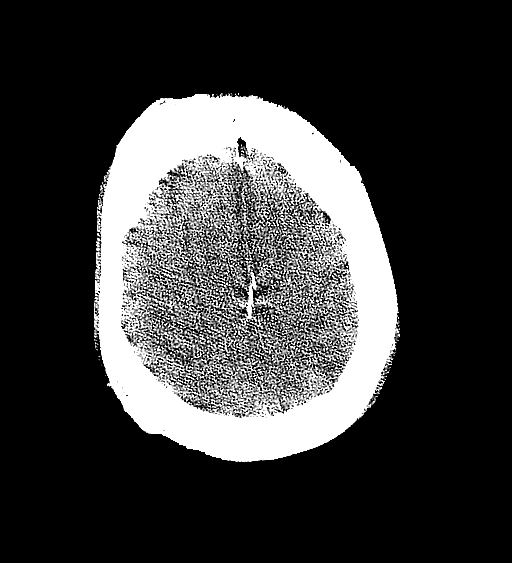
[im 76/85  brain]
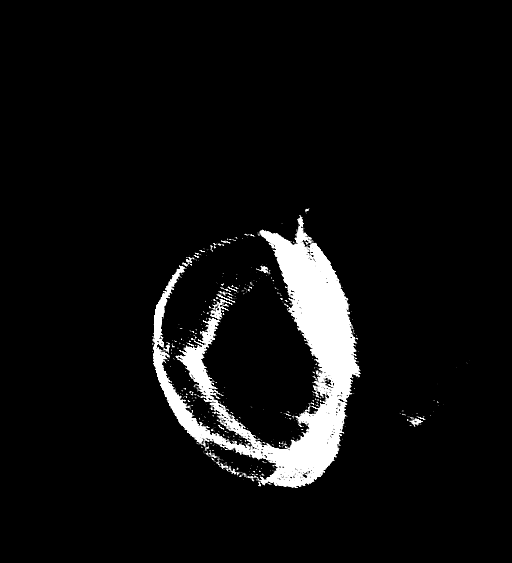
[im 76/85  bone]
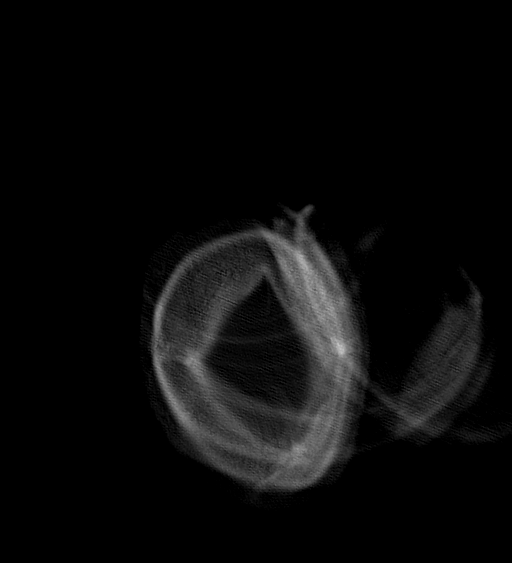

[Series 9: head 3.0 mpr cor · coronal · 0.23mm/px · 3 of 163 slices shown]
[im 33/163  brain]
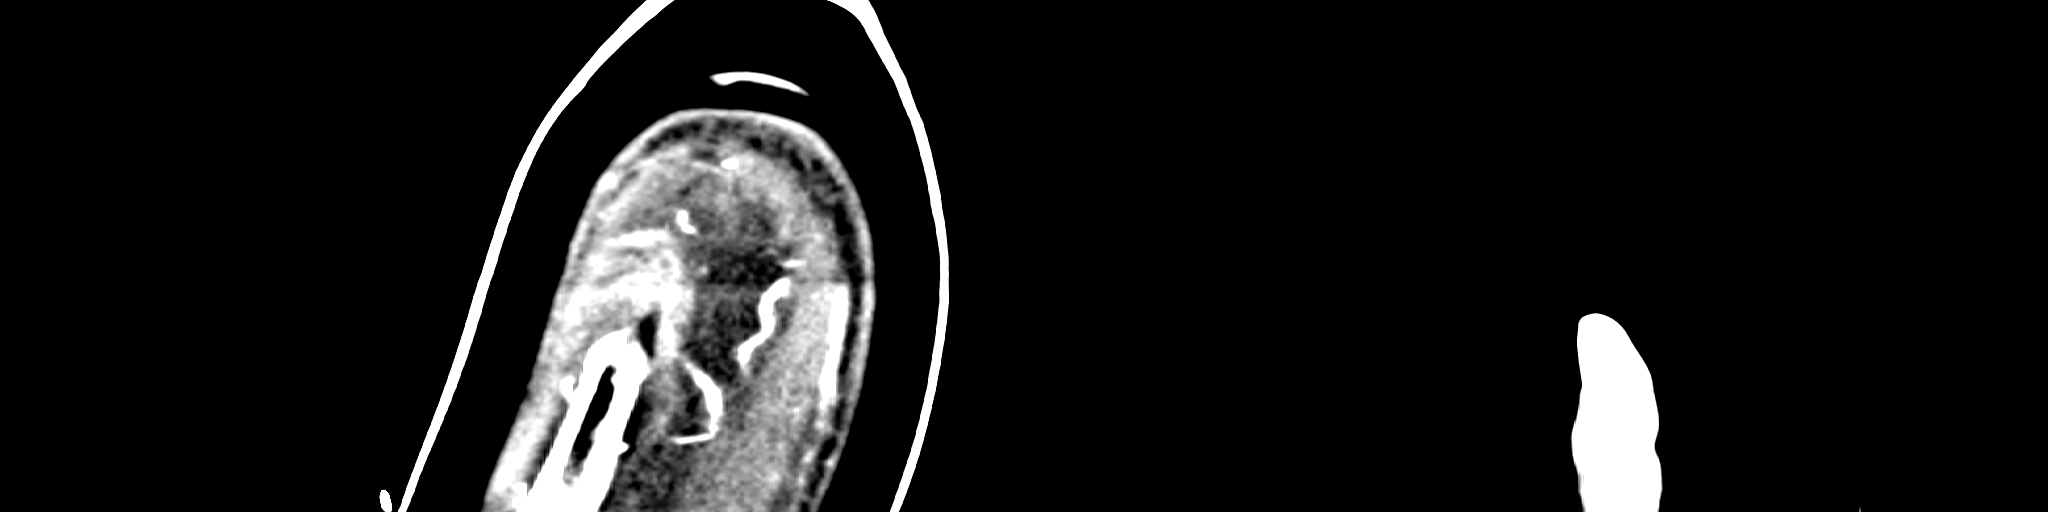
[im 65/163  brain]
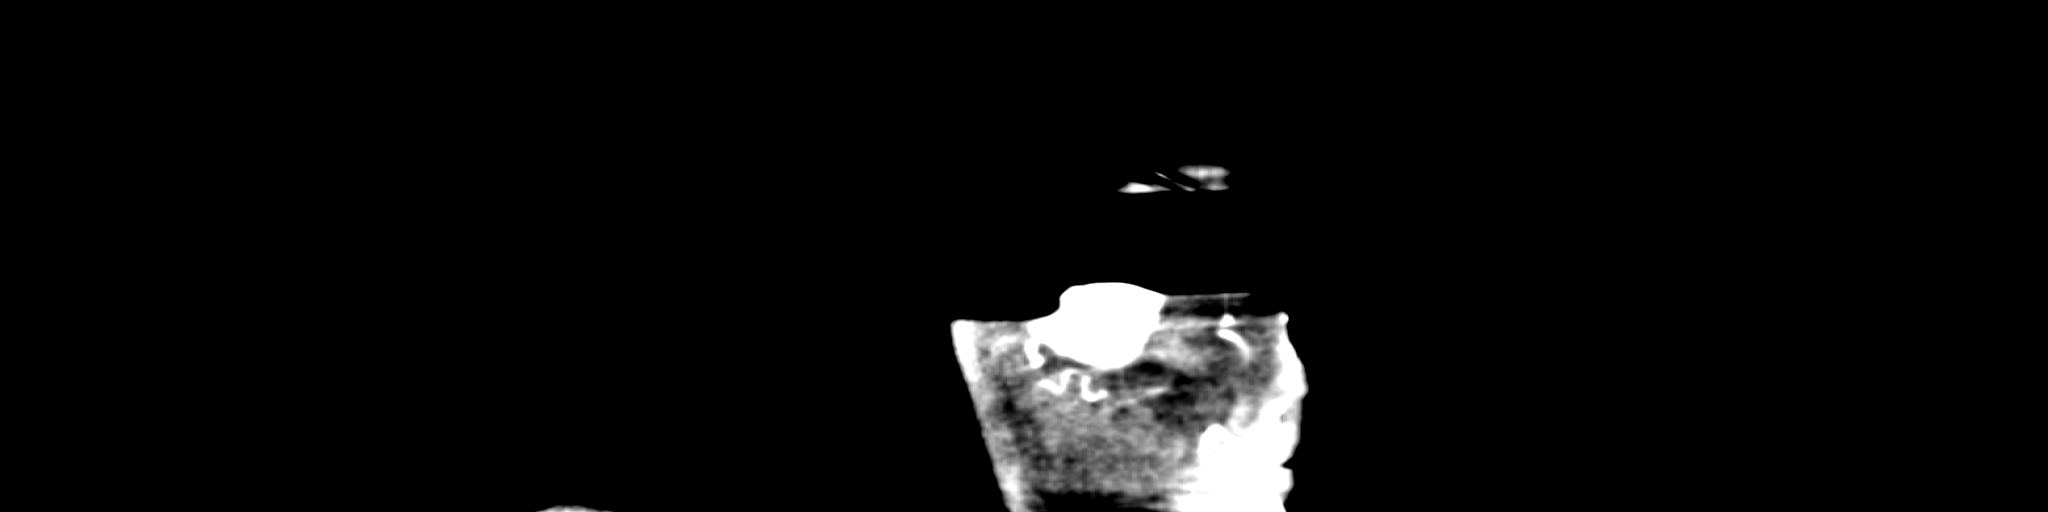
[im 98/163  brain]
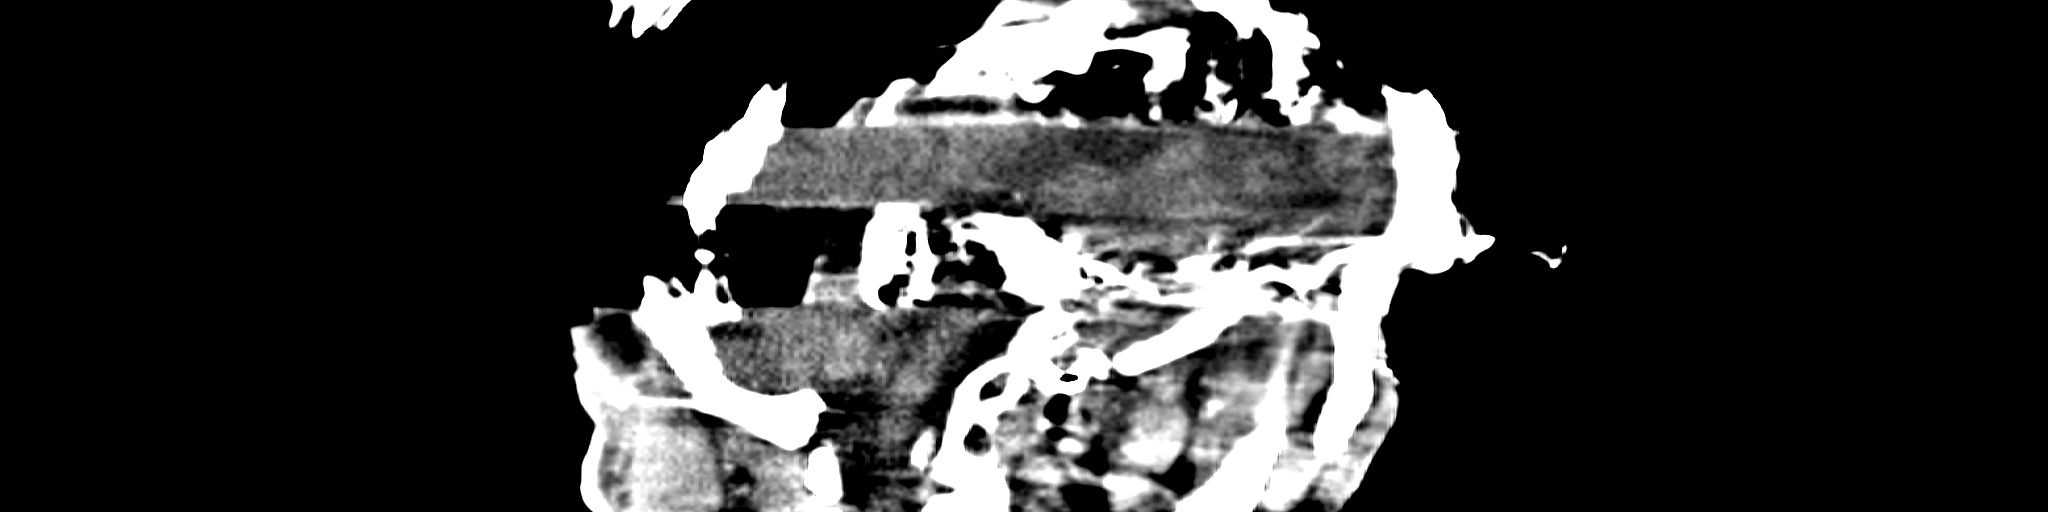

[8 of 47 positions shown; findings below may reference images not displayed]

FINDINGS: Brain: Nonstandard imaging. Numerous motion artifacts. Overall
nondiagnostic exam for acute intracranial process. Visible findings
include generalized atrophy and small vessel chronic ischemic
changes of deep cerebral white matter. No gross evidence of
hemorrhage or infarct on nondiagnostic study.

Vascular: Nondiagnostic

Skull: Nondiagnostic due to motion artifacts. No gross abnormality
seen.

Sinuses/Orbits: No gross sinus opacification

Other: N/A
IMPRESSION: Nondiagnostic exam due to motion artifacts.

Generalized atrophy and small vessel chronic ischemic changes of
deep cerebral white matter are present.

No definite acute findings identified though unable to exclude
additional acute intracranial abnormalities by this exam.
